# Patient Record
Sex: Male | Born: 1950 | Race: White | Hispanic: No | Smoking: Former smoker
Health system: Southern US, Community
[De-identification: ages and names within clinical notes are randomized; demographics above are authoritative.]

## PROBLEM LIST (undated history)

## (undated) DIAGNOSIS — M1990 Unspecified osteoarthritis, unspecified site: Secondary | ICD-10-CM

## (undated) DIAGNOSIS — G4730 Sleep apnea, unspecified: Secondary | ICD-10-CM

## (undated) DIAGNOSIS — I38 Endocarditis, valve unspecified: Secondary | ICD-10-CM

## (undated) DIAGNOSIS — Z952 Presence of prosthetic heart valve: Secondary | ICD-10-CM

## (undated) DIAGNOSIS — L0390 Cellulitis, unspecified: Secondary | ICD-10-CM

## (undated) DIAGNOSIS — I872 Venous insufficiency (chronic) (peripheral): Secondary | ICD-10-CM

## (undated) DIAGNOSIS — I482 Chronic atrial fibrillation, unspecified: Secondary | ICD-10-CM

## (undated) DIAGNOSIS — I509 Heart failure, unspecified: Secondary | ICD-10-CM

## (undated) DIAGNOSIS — F419 Anxiety disorder, unspecified: Secondary | ICD-10-CM

## (undated) DIAGNOSIS — I447 Left bundle-branch block, unspecified: Secondary | ICD-10-CM

## (undated) DIAGNOSIS — K3184 Gastroparesis: Secondary | ICD-10-CM

## (undated) DIAGNOSIS — I471 Supraventricular tachycardia, unspecified: Secondary | ICD-10-CM

## (undated) DIAGNOSIS — J449 Chronic obstructive pulmonary disease, unspecified: Secondary | ICD-10-CM

## (undated) DIAGNOSIS — I10 Essential (primary) hypertension: Secondary | ICD-10-CM

## (undated) DIAGNOSIS — I4891 Unspecified atrial fibrillation: Secondary | ICD-10-CM

## (undated) DIAGNOSIS — N200 Calculus of kidney: Secondary | ICD-10-CM

## (undated) DIAGNOSIS — A419 Sepsis, unspecified organism: Secondary | ICD-10-CM

## (undated) DIAGNOSIS — N390 Urinary tract infection, site not specified: Secondary | ICD-10-CM

## (undated) DIAGNOSIS — I5042 Chronic combined systolic (congestive) and diastolic (congestive) heart failure: Secondary | ICD-10-CM

## (undated) DIAGNOSIS — F332 Major depressive disorder, recurrent severe without psychotic features: Secondary | ICD-10-CM

## (undated) DIAGNOSIS — R6520 Severe sepsis without septic shock: Secondary | ICD-10-CM

## (undated) DIAGNOSIS — I2510 Atherosclerotic heart disease of native coronary artery without angina pectoris: Secondary | ICD-10-CM

## (undated) DIAGNOSIS — J9621 Acute and chronic respiratory failure with hypoxia: Secondary | ICD-10-CM

## (undated) DIAGNOSIS — L03116 Cellulitis of left lower limb: Secondary | ICD-10-CM

## (undated) DIAGNOSIS — I5022 Chronic systolic (congestive) heart failure: Secondary | ICD-10-CM

## (undated) DIAGNOSIS — E119 Type 2 diabetes mellitus without complications: Secondary | ICD-10-CM

## (undated) HISTORY — PX: CARDIAC VALVE REPLACEMENT: SHX585

## (undated) HISTORY — DX: Anxiety disorder, unspecified: F41.9

## (undated) HISTORY — DX: Presence of prosthetic heart valve: Z95.2

## (undated) HISTORY — DX: Venous insufficiency (chronic) (peripheral): I87.2

## (undated) HISTORY — PX: BACK SURGERY: SHX140

## (undated) HISTORY — DX: Left bundle-branch block, unspecified: I44.7

## (undated) HISTORY — DX: Urinary tract infection, site not specified: N39.0

## (undated) HISTORY — DX: Major depressive disorder, recurrent severe without psychotic features: F33.2

---
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
## 2004-06-11 ENCOUNTER — Ambulatory Visit: Payer: Self-pay | Admitting: Family Medicine

## 2004-07-08 HISTORY — PX: COLONOSCOPY: SHX174

## 2004-07-18 ENCOUNTER — Ambulatory Visit: Payer: Self-pay | Admitting: Family Medicine

## 2004-09-04 ENCOUNTER — Ambulatory Visit: Payer: Self-pay | Admitting: Family Medicine

## 2004-10-14 ENCOUNTER — Emergency Department (HOSPITAL_COMMUNITY): Admission: EM | Admit: 2004-10-14 | Discharge: 2004-10-14 | Payer: Self-pay | Admitting: Emergency Medicine

## 2004-10-16 ENCOUNTER — Ambulatory Visit: Payer: Self-pay | Admitting: Family Medicine

## 2004-10-22 ENCOUNTER — Ambulatory Visit (HOSPITAL_COMMUNITY): Admission: RE | Admit: 2004-10-22 | Discharge: 2004-10-22 | Payer: Self-pay | Admitting: Family Medicine

## 2004-10-22 ENCOUNTER — Ambulatory Visit: Payer: Self-pay | Admitting: *Deleted

## 2004-11-02 ENCOUNTER — Ambulatory Visit: Payer: Self-pay | Admitting: Family Medicine

## 2004-12-04 ENCOUNTER — Ambulatory Visit: Payer: Self-pay | Admitting: Family Medicine

## 2005-01-22 ENCOUNTER — Ambulatory Visit: Payer: Self-pay | Admitting: Internal Medicine

## 2005-01-22 ENCOUNTER — Ambulatory Visit (HOSPITAL_COMMUNITY): Admission: RE | Admit: 2005-01-22 | Discharge: 2005-01-22 | Payer: Self-pay | Admitting: Internal Medicine

## 2005-03-28 ENCOUNTER — Ambulatory Visit: Payer: Self-pay | Admitting: Family Medicine

## 2005-04-10 ENCOUNTER — Ambulatory Visit: Payer: Self-pay | Admitting: Family Medicine

## 2005-05-22 ENCOUNTER — Ambulatory Visit: Payer: Self-pay | Admitting: Family Medicine

## 2005-07-22 ENCOUNTER — Ambulatory Visit: Payer: Self-pay | Admitting: Family Medicine

## 2005-10-17 ENCOUNTER — Ambulatory Visit: Payer: Self-pay | Admitting: Family Medicine

## 2005-10-28 ENCOUNTER — Ambulatory Visit: Payer: Self-pay | Admitting: Family Medicine

## 2005-11-30 ENCOUNTER — Emergency Department (HOSPITAL_COMMUNITY): Admission: EM | Admit: 2005-11-30 | Discharge: 2005-11-30 | Payer: Self-pay | Admitting: Emergency Medicine

## 2006-02-05 ENCOUNTER — Ambulatory Visit: Payer: Self-pay | Admitting: Family Medicine

## 2006-03-25 LAB — HM MAMMOGRAPHY: HM Mammogram: NORMAL

## 2006-05-08 ENCOUNTER — Ambulatory Visit: Payer: Self-pay | Admitting: Family Medicine

## 2006-05-13 ENCOUNTER — Ambulatory Visit: Payer: Self-pay | Admitting: Family Medicine

## 2006-08-21 ENCOUNTER — Ambulatory Visit: Payer: Self-pay | Admitting: Family Medicine

## 2006-11-24 ENCOUNTER — Ambulatory Visit: Payer: Self-pay | Admitting: Family Medicine

## 2009-06-14 LAB — HM COLONOSCOPY: HM Colonoscopy: NORMAL

## 2010-11-23 NOTE — Op Note (Signed)
NAME:  DRADEN, COTTINGHAM                ACCOUNT NO.:  [id number]   MEDICAL RECORD NO.:  [id number]          PATIENT TYPE:  AMB   LOCATION:  DAY                           FACILITY:  [facility]   PHYSICIAN:  R. Roetta Sessions, M.D. DATE OF BIRTH:  [birth year]   DATE OF PROCEDURE:  01/22/2005  DATE OF DISCHARGE:                                 OPERATIVE REPORT   PROCEDURE:  Screening colonoscopy.   INDICATIONS FOR PROCEDURE:  The patient is a 60 year old gentleman with no  lower GI tract symptoms sent over courtesy of Dr. Joette Catching for  colorectal cancer screening.  There is no family history of colorectal  neoplasia.  He has never had a colonoscopy.  Colonoscopy is now being done.  This procedure has been discussed with the patient at length the potential  risks, benefits, and alternatives.  All questions answered.  He is agreeable  to proceed.  Documentation in the medial record.   PROCEDURE NOTE:  O2 saturation, blood pressure __________ monitored through  the entire procedure.   CONSCIOUS SEDATION:  Versed 4 mg IV, Demerol 75 mg IV in divided doses.   INSTRUMENT:  Olympus video __________ system.   FINDINGS:  Digital exam revealed no abnormalities.  Scope findings:  Prep  was good on the left side, somewhat poor on the right side.  Rectum:  Examination of the rectum with retroflexion at the anal verge revealed no  abnormalities.   Colonic mucosa was surveyed from the rectosigmoid junction, through the  left, transverse, and right colon, to the appendiceal orifice, ileocecal  valve, and cecum.  These structures were well seen and photographed for the  record.  There was a thin coating of stool throughout the right colon  mucosa, which made the exam more difficult.  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  The remainder of the cecum, ileocecal valve area appeared  just fine.  The scope was slowly withdrawn.  All previously mentioned  mucosal surfaces were again seen.  The more distal  colonic mucosa also  appeared normal, with no other diverticula or other abnormalities seen.  The  patient tolerated the procedure well and was reactivated.   ENDOSCOPIC IMPRESSION:  1.  Normal rectum.  2.  Solitary cecal diverticulum.  3.  Rectal and colonic mucosa appeared normal.   RECOMMENDATIONS:  Repeat colonoscopy in 10 years for screening purposes.       RMR/MEDQ  D:  01/22/2005  T:  01/22/2005  Job:  [id number]   cc:   Delaney Meigs, M.D.  [address]  [place]  [place] [address]  Fax: [phone]   R. Roetta Sessions, M.D.  [address]  [place]  [place] [address]

## 2010-11-23 NOTE — Procedures (Signed)
NAME:  OLA, FAWVER NO.:  [id number]   MEDICAL RECORD NO.:  [id number]          PATIENT TYPE:  OUT   LOCATION:  RAD                           FACILITY:  [facility]   PHYSICIAN:  [place] Bing, M.D.  DATE OF BIRTH:  January 18, 1951   DATE OF PROCEDURE:  10/22/2004  DATE OF DISCHARGE:                                  ECHOCARDIOGRAM   REFERRING PHYSICIAN:  Delaney Meigs, M.D.   CLINICAL DATA:  A 60 year old gentleman with a murmur.   Aorta 3.8, left atrium 4.7, septum 1.9, posterior wall 1.4, LV diastole 5.1,  LV systole 3.8.   1.  Technically difficult and somewhat suboptimal echocardiographic study.  2.  Mild left atrial enlargement; normal right atrium and right ventricle.  3.  Minimal aortic valvular sclerosis with mild calcification of the      proximal ascending aorta.  4.  Normal mitral valve; systolic anterior motion of the chordae, which is a      normal variant.  5.  Normal tricuspid valve.  6.  Normal pulmonic valve and proximal pulmonary artery.  7.  Normal left ventricular size; left ventricular hypertrophy present. Not      all myocardial segments adequately evaluated, no areas of akinesis nor      dyskinesis. Overall LV systolic function appears normal.      RR/MEDQ  D:  10/22/2004  T:  10/22/2004  Job:  [id number]   cc:   Delaney Meigs, M.D.  [address]  [place]  [place] [address]  Fax: [phone]

## 2012-06-19 ENCOUNTER — Inpatient Hospital Stay: Payer: Self-pay | Admitting: Cardiology

## 2012-06-19 DIAGNOSIS — I959 Hypotension, unspecified: Secondary | ICD-10-CM | POA: Diagnosis present

## 2012-06-19 DIAGNOSIS — I471 Supraventricular tachycardia, unspecified: Secondary | ICD-10-CM

## 2012-06-19 DIAGNOSIS — L03119 Cellulitis of unspecified part of limb: Secondary | ICD-10-CM | POA: Diagnosis present

## 2012-06-19 DIAGNOSIS — K089 Disorder of teeth and supporting structures, unspecified: Secondary | ICD-10-CM | POA: Diagnosis present

## 2012-06-26 ENCOUNTER — Encounter (HOSPITAL_COMMUNITY): Payer: Self-pay | Admitting: *Deleted

## 2012-06-26 ENCOUNTER — Other Ambulatory Visit: Payer: Self-pay | Admitting: *Deleted

## 2012-06-26 ENCOUNTER — Encounter: Payer: Self-pay | Admitting: Cardiology

## 2012-06-26 ENCOUNTER — Inpatient Hospital Stay (HOSPITAL_COMMUNITY)
Admission: AD | Admit: 2012-06-26 | Discharge: 2012-07-15 | DRG: 988 | Disposition: A | Discharge status: Home / Self Care | Payer: MEDICAID | Source: Other Acute Inpatient Hospital | Attending: Internal Medicine | Admitting: Internal Medicine

## 2012-06-26 DIAGNOSIS — I340 Nonrheumatic mitral (valve) insufficiency: Secondary | ICD-10-CM | POA: Diagnosis present

## 2012-06-26 DIAGNOSIS — T368X5A Adverse effect of other systemic antibiotics, initial encounter: Secondary | ICD-10-CM | POA: Diagnosis present

## 2012-06-26 DIAGNOSIS — I471 Supraventricular tachycardia, unspecified: Secondary | ICD-10-CM

## 2012-06-26 DIAGNOSIS — D696 Thrombocytopenia, unspecified: Secondary | ICD-10-CM | POA: Diagnosis not present

## 2012-06-26 DIAGNOSIS — K045 Chronic apical periodontitis: Secondary | ICD-10-CM | POA: Diagnosis present

## 2012-06-26 DIAGNOSIS — F3289 Other specified depressive episodes: Secondary | ICD-10-CM | POA: Diagnosis present

## 2012-06-26 DIAGNOSIS — N289 Disorder of kidney and ureter, unspecified: Secondary | ICD-10-CM

## 2012-06-26 DIAGNOSIS — L270 Generalized skin eruption due to drugs and medicaments taken internally: Secondary | ICD-10-CM | POA: Diagnosis not present

## 2012-06-26 DIAGNOSIS — N179 Acute kidney failure, unspecified: Secondary | ICD-10-CM | POA: Diagnosis present

## 2012-06-26 DIAGNOSIS — L03119 Cellulitis of unspecified part of limb: Secondary | ICD-10-CM | POA: Diagnosis present

## 2012-06-26 DIAGNOSIS — Z79899 Other long term (current) drug therapy: Secondary | ICD-10-CM

## 2012-06-26 DIAGNOSIS — I447 Left bundle-branch block, unspecified: Secondary | ICD-10-CM | POA: Diagnosis present

## 2012-06-26 DIAGNOSIS — R21 Rash and other nonspecific skin eruption: Secondary | ICD-10-CM | POA: Diagnosis not present

## 2012-06-26 DIAGNOSIS — I059 Rheumatic mitral valve disease, unspecified: Secondary | ICD-10-CM | POA: Diagnosis present

## 2012-06-26 DIAGNOSIS — E46 Unspecified protein-calorie malnutrition: Secondary | ICD-10-CM | POA: Diagnosis not present

## 2012-06-26 DIAGNOSIS — K089 Disorder of teeth and supporting structures, unspecified: Secondary | ICD-10-CM | POA: Diagnosis present

## 2012-06-26 DIAGNOSIS — I330 Acute and subacute infective endocarditis: Principal | ICD-10-CM

## 2012-06-26 DIAGNOSIS — I38 Endocarditis, valve unspecified: Secondary | ICD-10-CM

## 2012-06-26 DIAGNOSIS — K029 Dental caries, unspecified: Secondary | ICD-10-CM | POA: Diagnosis present

## 2012-06-26 DIAGNOSIS — K036 Deposits [accretions] on teeth: Secondary | ICD-10-CM | POA: Diagnosis present

## 2012-06-26 DIAGNOSIS — I959 Hypotension, unspecified: Secondary | ICD-10-CM | POA: Diagnosis present

## 2012-06-26 DIAGNOSIS — L02419 Cutaneous abscess of limb, unspecified: Secondary | ICD-10-CM | POA: Diagnosis present

## 2012-06-26 DIAGNOSIS — F32A Depression, unspecified: Secondary | ICD-10-CM | POA: Diagnosis present

## 2012-06-26 DIAGNOSIS — E119 Type 2 diabetes mellitus without complications: Secondary | ICD-10-CM | POA: Diagnosis present

## 2012-06-26 DIAGNOSIS — K0530 Chronic periodontitis, unspecified: Secondary | ICD-10-CM | POA: Diagnosis present

## 2012-06-26 DIAGNOSIS — Z6841 Body Mass Index (BMI) 40.0 and over, adult: Secondary | ICD-10-CM

## 2012-06-26 DIAGNOSIS — F329 Major depressive disorder, single episode, unspecified: Secondary | ICD-10-CM | POA: Diagnosis present

## 2012-06-26 DIAGNOSIS — Z7982 Long term (current) use of aspirin: Secondary | ICD-10-CM

## 2012-06-26 DIAGNOSIS — Z6835 Body mass index (BMI) 35.0-35.9, adult: Secondary | ICD-10-CM | POA: Diagnosis present

## 2012-06-26 DIAGNOSIS — I10 Essential (primary) hypertension: Secondary | ICD-10-CM | POA: Diagnosis present

## 2012-06-26 DIAGNOSIS — Z87442 Personal history of urinary calculi: Secondary | ICD-10-CM | POA: Diagnosis present

## 2012-06-26 DIAGNOSIS — E669 Obesity, unspecified: Secondary | ICD-10-CM | POA: Diagnosis present

## 2012-06-26 DIAGNOSIS — E876 Hypokalemia: Secondary | ICD-10-CM | POA: Diagnosis present

## 2012-06-26 DIAGNOSIS — I498 Other specified cardiac arrhythmias: Secondary | ICD-10-CM | POA: Diagnosis present

## 2012-06-26 DIAGNOSIS — D649 Anemia, unspecified: Secondary | ICD-10-CM | POA: Diagnosis present

## 2012-06-26 DIAGNOSIS — K083 Retained dental root: Secondary | ICD-10-CM | POA: Diagnosis present

## 2012-06-26 HISTORY — DX: Morbid (severe) obesity due to excess calories: E66.01

## 2012-06-26 HISTORY — DX: Cellulitis of left lower limb: L03.116

## 2012-06-26 LAB — COMPREHENSIVE METABOLIC PANEL
ALT: 31 U/L (ref 0–53)
AST: 24 U/L (ref 0–37)
Albumin: 3 g/dL — ABNORMAL LOW (ref 3.5–5.2)
Alkaline Phosphatase: 70 U/L (ref 39–117)
BUN: 41 mg/dL — ABNORMAL HIGH (ref 6–23)
CO2: 19 mEq/L (ref 19–32)
Calcium: 8.7 mg/dL (ref 8.4–10.5)
Chloride: 95 mEq/L — ABNORMAL LOW (ref 96–112)
Creatinine, Ser: 5.65 mg/dL — ABNORMAL HIGH (ref 0.50–1.35)
GFR calc Af Amer: 11 mL/min — ABNORMAL LOW (ref >90)
GFR calc non Af Amer: 10 mL/min — ABNORMAL LOW (ref >90)
Glucose, Bld: 187 mg/dL — ABNORMAL HIGH (ref 70–99)
Potassium: 3.9 mEq/L (ref 3.5–5.1)
Sodium: 129 mEq/L — ABNORMAL LOW (ref 135–145)
Total Bilirubin: 0.4 mg/dL (ref 0.3–1.2)
Total Protein: 6.4 g/dL (ref 6.0–8.3)

## 2012-06-26 LAB — CBC
HCT: 38.1 % — ABNORMAL LOW (ref 39.0–52.0)
Hemoglobin: 12.8 g/dL — ABNORMAL LOW (ref 13.0–17.0)
MCH: 30.9 pg (ref 26.0–34.0)
MCHC: 33.6 g/dL (ref 30.0–36.0)
MCV: 92 fL (ref 78.0–100.0)
Platelets: 133 10*3/uL — ABNORMAL LOW (ref 150–400)
RBC: 4.14 MIL/uL — ABNORMAL LOW (ref 4.22–5.81)
RDW: 13.1 % (ref 11.5–15.5)
WBC: 7.6 10*3/uL (ref 4.0–10.5)

## 2012-06-26 LAB — GLUCOSE, CAPILLARY
Glucose-Capillary: 166 mg/dL — ABNORMAL HIGH (ref 70–99)
Glucose-Capillary: 181 mg/dL — ABNORMAL HIGH (ref 70–99)

## 2012-06-26 LAB — VANCOMYCIN, RANDOM: Vancomycin Rm: 63.6 ug/mL

## 2012-06-26 LAB — MRSA PCR SCREENING: MRSA by PCR: NEGATIVE

## 2012-06-26 MED ORDER — HEPARIN SODIUM (PORCINE) 5000 UNIT/ML IJ SOLN
5000.0000 [IU] | Freq: Three times a day (TID) | INTRAMUSCULAR | Status: AC
  Administered 2012-06-26 – 2012-07-09 (×39): 5000 [IU] via SUBCUTANEOUS
  Filled 2012-06-26 (×41): qty 1

## 2012-06-26 MED ORDER — ASPIRIN EC 81 MG PO TBEC
81.0000 mg | DELAYED_RELEASE_TABLET | Freq: Every day | ORAL | Status: DC
  Administered 2012-06-26 – 2012-07-15 (×19): 81 mg via ORAL
  Filled 2012-06-26 (×20): qty 1

## 2012-06-26 MED ORDER — SERTRALINE HCL 100 MG PO TABS
100.0000 mg | ORAL_TABLET | Freq: Every day | ORAL | Status: DC
  Administered 2012-06-26 – 2012-07-02 (×7): 100 mg via ORAL
  Filled 2012-06-26 (×7): qty 1

## 2012-06-26 MED ORDER — SODIUM CHLORIDE 0.9 % IV SOLN
250.0000 mL | INTRAVENOUS | Status: DC | PRN
  Administered 2012-07-09: 500 mL via INTRAVENOUS

## 2012-06-26 MED ORDER — OMEGA-3-ACID ETHYL ESTERS 1 G PO CAPS
1.0000 g | ORAL_CAPSULE | Freq: Every day | ORAL | Status: DC
  Administered 2012-06-26 – 2012-07-15 (×18): 1 g via ORAL
  Filled 2012-06-26 (×20): qty 1

## 2012-06-26 MED ORDER — INSULIN GLARGINE 100 UNIT/ML ~~LOC~~ SOLN
20.0000 [IU] | Freq: Every day | SUBCUTANEOUS | Status: DC
  Administered 2012-06-26 – 2012-07-14 (×19): 20 [IU] via SUBCUTANEOUS

## 2012-06-26 MED ORDER — SODIUM CHLORIDE 0.9 % IJ SOLN
3.0000 mL | INTRAMUSCULAR | Status: DC | PRN
  Administered 2012-06-29 (×2): 3 mL via INTRAVENOUS

## 2012-06-26 MED ORDER — ADULT MULTIVITAMIN W/MINERALS CH
1.0000 | ORAL_TABLET | Freq: Every day | ORAL | Status: DC
  Administered 2012-06-26 – 2012-07-15 (×18): 1 via ORAL
  Filled 2012-06-26 (×20): qty 1

## 2012-06-26 MED ORDER — ONDANSETRON HCL 4 MG/2ML IJ SOLN: 4.0000 mg | Freq: Four times a day (QID) | INTRAMUSCULAR | Status: DC | PRN

## 2012-06-26 MED ORDER — SODIUM CHLORIDE 0.9 % IJ SOLN
3.0000 mL | Freq: Two times a day (BID) | INTRAMUSCULAR | Status: DC
  Administered 2012-06-27: 30 mL via INTRAVENOUS
  Administered 2012-06-29 – 2012-07-09 (×17): 3 mL via INTRAVENOUS

## 2012-06-26 MED ORDER — INSULIN ASPART 100 UNIT/ML ~~LOC~~ SOLN
0.0000 [IU] | Freq: Three times a day (TID) | SUBCUTANEOUS | Status: DC
  Administered 2012-06-27: 2 [IU] via SUBCUTANEOUS
  Administered 2012-06-27: 3 [IU] via SUBCUTANEOUS
  Administered 2012-06-27: 2 [IU] via SUBCUTANEOUS
  Administered 2012-06-28 (×2): 3 [IU] via SUBCUTANEOUS
  Administered 2012-06-28: 2 [IU] via SUBCUTANEOUS
  Administered 2012-06-29: 17:00:00 via SUBCUTANEOUS
  Administered 2012-06-30: 2 [IU] via SUBCUTANEOUS
  Administered 2012-06-30 (×2): 3 [IU] via SUBCUTANEOUS
  Administered 2012-07-01 (×3): 2 [IU] via SUBCUTANEOUS
  Administered 2012-07-02: 3 [IU] via SUBCUTANEOUS
  Administered 2012-07-02: 2 [IU] via SUBCUTANEOUS
  Administered 2012-07-02: 3 [IU] via SUBCUTANEOUS
  Administered 2012-07-03: 2 [IU] via SUBCUTANEOUS
  Administered 2012-07-03: 3 [IU] via SUBCUTANEOUS
  Administered 2012-07-04: 2 [IU] via SUBCUTANEOUS
  Administered 2012-07-04: 3 [IU] via SUBCUTANEOUS
  Administered 2012-07-05: 2 [IU] via SUBCUTANEOUS
  Administered 2012-07-05 (×2): 3 [IU] via SUBCUTANEOUS
  Administered 2012-07-06: 5 [IU] via SUBCUTANEOUS
  Administered 2012-07-06 – 2012-07-07 (×3): 3 [IU] via SUBCUTANEOUS
  Administered 2012-07-07: 5 [IU] via SUBCUTANEOUS
  Administered 2012-07-07 – 2012-07-08 (×2): 3 [IU] via SUBCUTANEOUS
  Administered 2012-07-08: 2 [IU] via SUBCUTANEOUS
  Administered 2012-07-08: 5 [IU] via SUBCUTANEOUS
  Administered 2012-07-09 – 2012-07-10 (×3): 3 [IU] via SUBCUTANEOUS
  Administered 2012-07-11: 5 [IU] via SUBCUTANEOUS
  Administered 2012-07-12 – 2012-07-13 (×3): 3 [IU] via SUBCUTANEOUS
  Administered 2012-07-14: 18:00:00 via SUBCUTANEOUS
  Administered 2012-07-14: 3 [IU] via SUBCUTANEOUS
  Administered 2012-07-15: 5 [IU] via SUBCUTANEOUS
  Administered 2012-07-15: 2 [IU] via SUBCUTANEOUS

## 2012-06-26 MED ORDER — GLIPIZIDE 10 MG PO TABS
10.0000 mg | ORAL_TABLET | Freq: Two times a day (BID) | ORAL | Status: DC
  Administered 2012-06-27: 10 mg via ORAL
  Filled 2012-06-26 (×3): qty 1

## 2012-06-26 MED ORDER — ACETAMINOPHEN 325 MG PO TABS
650.0000 mg | ORAL_TABLET | ORAL | Status: DC | PRN
  Administered 2012-07-02 – 2012-07-13 (×3): 650 mg via ORAL
  Filled 2012-06-26 (×4): qty 2

## 2012-06-26 MED ORDER — NITROGLYCERIN 0.4 MG SL SUBL: 0.4000 mg | SUBLINGUAL_TABLET | SUBLINGUAL | Status: DC | PRN

## 2012-06-26 MED ORDER — GABAPENTIN 300 MG PO CAPS
300.0000 mg | ORAL_CAPSULE | Freq: Three times a day (TID) | ORAL | Status: DC
  Administered 2012-06-26 – 2012-06-27 (×2): 300 mg via ORAL
  Filled 2012-06-26 (×4): qty 1

## 2012-06-26 NOTE — H&P (Signed)
Patient ID: Cody Hale MRN: [id number], DOB/AGE: 02-17-1951   Admit date: 06/26/2012  Primary Physician: [facility] Dept. Primary Cardiologist: G. Degent MD  Pt. Profile:  61 y/o male without prior cardiac hx who was admitted to [facility] on Monday 12/16 with hypotension and presumed sepsis, who was transferred to [facility] for further evaluation after TEE today showed large mitral valve vegetation.  Problem List  Past Medical History  Diagnosis Date  . Poor dentition 06/26/2012    Severe dental caries and cavities  . Diabetes mellitus without complication   . Left leg cellulitis   . Obesity   . History of tobacco abuse   . Mitral valve vegetation     a. 06/25/2012 Echo: EF 45-50%, mod-sev conc LVH, nl RV, ? MV veg, Trace TR;  06/26/2012 TEE:  MV vegetation noted.    No past surgical history on file.   Allergies  No Known Allergies  HPI  61 y/o male w/o prior cardiac hx.  He notes that over the past few months, he has been experiencing progressive fatigue and low-energy state.  Starting on Monday morning, 12/16, he developed severe chills and fevers along with worsening fatigue/malaise.  He presented to [facility] in [place] where he was found to be hypotensive with elevated lactate (2.4).  He was admitted to Internal Medicine and placed on IV Vancomycin.  He was also aggressively hydrated with stabilization of blood pressures.  He did have intermittent diarrhea and stool was negative for C diff.  LE u/s was performed upon the left lower leg 2/2 erythema & swelling/cellulits, and this was negative for DVT.  Pt was clinically improving and on 12/18 was noted to have runs of SVT.  Cardiology was consulted and he was seen by Dr. Andee Lineman.  Bedside echo was performed and inconclusive.  F/U formal echo showed EF of 45-50% with question of MV vegetation.  He subsequently underwent TEE today, which confirmed a large MV vegetation.  He was transferred to [facility] for surgical and  infectious disease evaluation.  Upon arrival, pt is w/o complaints and is hemodynamically stable.  Of note, in the setting of vancomycin therapy, he has developed acute renal failure with rise in his creatinine from 0.7 on 12/18 to 4.68 this AM.  Per Dr. Andee Lineman, Vancomycin trough today was 77.9 (I confirmed this with lab @ [facility]).  Home Medications  Prior to Admission medications   Medication Sig Start Date End Date Taking? Authorizing Provider  aspirin EC 81 MG tablet Take 81 mg by mouth daily.   Yes Historical Provider, MD  gabapentin (NEURONTIN) 300 MG capsule Take 300 mg by mouth 3 (three) times daily.   Yes Historical Provider, MD  glipiZIDE (GLUCOTROL) 10 MG tablet Take 10 mg by mouth 2 (two) times daily before a meal.   Yes Historical Provider, MD  meloxicam (MOBIC) 15 MG tablet Take 15 mg by mouth daily.   Yes Historical Provider, MD  metFORMIN (GLUCOPHAGE) 500 MG tablet Take 1,000 mg by mouth 2 (two) times daily with a meal.    Yes Historical Provider, MD  Multiple Vitamin (MULTIVITAMIN WITH MINERALS) TABS Take 1 tablet by mouth daily.   Yes Historical Provider, MD  olmesartan-hydrochlorothiazide (BENICAR HCT) 40-25 MG per tablet Take 1 tablet by mouth daily.   Yes Historical Provider, MD  Omega-3 Fatty Acids (FISH OIL PO) Take 1 capsule by mouth daily.   Yes Historical Provider, MD  sertraline (ZOLOFT) 100 MG tablet Take 100 mg by mouth  daily.   Yes Historical Provider, MD   Family History  Family History  Problem Relation Age of Onset  . Lung cancer Father     died @ 106  . Ovarian cancer Mother     died @ 27   Social History  History   Social History  . Marital Status: Single    Spouse Name: N/A    Number of Children: N/A  . Years of Education: N/A   Occupational History  . Not on file.   Social History Main Topics  . Smoking status: Former Games developer  . Smokeless tobacco: Not on file     Comment: smoked about 1.5ppd x 15 yrs, quit 15 yrs ago.  . Alcohol Use: No    . Drug Use: No  . Sexually Active: Not on file   Other Topics Concern  . Not on file   Social History Narrative   Lives in [place] with his wife.  Works @ [employer].  Does not routinely exercise.    Review of Systems General:  No chills, fever, night sweats or weight changes.  Cardiovascular:  No chest pain, dyspnea on exertion, edema, orthopnea, palpitations, paroxysmal nocturnal dyspnea. Dermatological: No rash, lesions/masses Respiratory: No cough, dyspnea Urologic: No hematuria, dysuria Abdominal:   No nausea, vomiting, diarrhea, bright red blood per rectum, melena, or hematemesis Neurologic:  No visual changes, wkns, changes in mental status. All other systems reviewed and are otherwise negative except as noted above.  Physical Exam  Blood pressure 157/59, pulse 82, temperature 97.9 F (36.6 C), temperature source Oral, resp. rate 14, height 5\' 10"  (1.778 m), weight 315 lb 0.6 oz (142.9 kg), SpO2 97.00%.  General: Pleasant, NAD Psych: Normal affect. Neuro: Alert and oriented X 3. Moves all extremities spontaneously. HEENT: Normal with exception of poor dentition. Neck: Supple, obese, without bruits. Difficult to assess JVP. Lungs:  Resp regular and unlabored, CTA. Heart: RRR no s3, s4, 2/6 syst murmur heard best @ [facility]. Abdomen: Soft, non-tender, protuberant, BS + x 4.  Extremities: No clubbing, cyanosis.  Left lower leg with 1+ edema and erythema.  Trace edema on the right.  DP/PT/Radials 2+ and equal bilaterally.  Labs   See [facility] records.  Radiology/Studies  See [facility] records.  ECG  Pending.  ASSESSMENT AND PLAN  1.  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  We have spoken with thoracic surgery and infectious disease who will see patient on Monday and Saturday respectively.  We will  continue Vancomycin per pharmacy, understanding that his last trough was markedly elevated.  I spoke with lab @ [facility] and they confirmed level of 77.9 at 10:34 this AM.  We will repeat a level now.  In preparation for surgical intervention, we will obtain orthopantomogram and request a dental consult for Monday.  2.  LLE Cellulitis:  Vanc per pharmacy.  3.  DM:  Cont lantus/ssi.  Hold metformin.  4.  Acute renal failure:  In setting of vancomycin toxicity.  Follow creatinine over the weekend.    Signed, Nicolasa Ducking, NP 06/26/2012, 6:32 PM   History and all data above reviewed.  Patient examined.  I agree with the findings as above. I have reviewed the TEE.  I spoke with Dr. Andee Lineman, Dr. Cornelius Moras and Dr. Orvan Falconer.   The patient has a large anterior leaflet mitral valve vegetation.  Blood cultures are  negative.  However, a likely source is oral.  He has acute renal insufficiency related to vancomycin toxicity.  He currently denies any chest pain or SOB.  He has no ongoing fevers or chills.  Hypotension related to sepsis syndrome has improved.  The patient exam reveals COR:RRR slight systolic murmur  ,  Lungs: Clear  ,  Abd: Positive bowel sounds, no rebound no guarding, Ext Severe left greater than right edema with left leg erythema  .  All available labs, radiology testing, previous records reviewed. Agree with documented assessment and plan. I have discussed the case as above.  The patient has indication for mitral valve surgery.  He is currently symptom free.  He will be seen by Dr. Cornelius Moras on Monday.  ID will see the patient in the AM.  We will check dental XRays.  I was unable to contact oral surgery on call. Vancomycin is held and a level will be drawn.  Surgery will need to be deferred pending improvement in renal function and an oral surgery consult.     Fayrene Fearing Keone Kamer  9:40 PM  06/26/2012

## 2012-06-26 NOTE — Progress Notes (Signed)
Per Marvis Repress, pts dental xray can be done [facility]. Pt must travel on tele.

## 2012-06-26 NOTE — H&P (Addendum)
Patient transferred from [facility] to [facility]. He has been evaluated and followed by Dr. Andee Lineman for diagnosis of culture negative endocarditis with large mitral valve vegetation and increased risk for thromboembolic event. Transfer to a tertiary care facility for surgical consultation recommended, and patient preferred [facility] rather than [facility], however plans to continue to followup with Dr. Andee Lineman after discharge. I was asked to help facilitate transfer, and records were moved into Epic from the [place] system including Dr. Margarita Mail consultation note, and a surface echocardiogram. TEE report is still pending, however a disc of the images is being sent with the patient for review. Dr. Andee Lineman indicates that he has spoken with Dr. Cornelius Moras from [facility] and relayed information to him. Dr. Tenny Craw is the accepting physician on our service. We have spoken with Trish to help coordinate consultation with [facility], ID, and also consideration for brain MRI to assess for any potential subclinical evidence of thromboembolism, although today's creatinine at [facility] is listed at 4.6 and was 0.7 on 12/18 (this needs to be repeated). Below is copied Dr. Margarita Mail initial consultation note. Patient has been stable on telemetry, presently on vancomycin. He will need to be seen and assessed by our team on arrival to [facility] - I have not been involved in his care at [facility].  CARDIOLOGY CONSULTATION REPORT   PRIMARY CARDIOLOGIST: DR. Michelle Piper DEGENT   DATE OF CONSULTATION: 06/24/2012   PRIMARY CARE PROVIDER: [facility] Department.   REQUESTING PHYSICIAN: Erasmo Downer, M.D.   REASON FOR CONSULTATION: Arrhythmia with run of supraventricular tachycardia.  Patient has underlying left bundle branch block.   HISTORY OF PRESENT ILLNESS: The patient was admitted with profound hypotension  requiring intravenous pressors and central line placement. Blood cultures have  been negative, but the patient  has cellulitis of the left lower extremity. The  patient is also diabetic. He has no known heart condition, although he states  that he has had an ultrasound done several years ago at [facility]. The  patient is significantly overweight and has poor exercise tolerance. On  admission also a serum lactate was elevated at 3. Cardiac enzymes were normal.  EKG showed a left bundle branch block.  During his ICU stay, the patient had normal sinus rhythm, typically in the 90s.  He was moved out of the intensive care unit today, and Dr. Linna Darner called me  because he had noticed that the patient had runs of supraventricular  tachycardia. He was not sure if this has been ventricular tachycardia or not.  The patient, however, had left bundle branch block, and the morphology of these  runs was similar to his baseline EKG.  The patient's blood pressure has improved now. However, he continues to have a  significant cellulitis of the left lower extremity.   PAST MEDICAL HISTORY:  1. Diabetes mellitus.  2. History of depression.  3. History of kidney stones.   PAST SURGICAL HISTORY: Surgery on jaw after traumatic fracture.   SOCIAL HISTORY: The patient is married and lives with his wife, who is my  patient. Denies significant use of alcohol. Currently works at a [employer] and does not smoke.   FAMILY HISTORY: negative for premature coronary artery disease or cancer.   ALLERGIES: No known drug allergies.   ADMISSION MEDICATIONS:  1. Zoloft 100 a day.  2. Benicar 40 a day.  3. Glipizide 5 mg a day.  4. Mobic 15 mg daily.  5. Gabapentin 300 mg p.o. t.i.d.  6. Metformin 500 mg p.o. b.i.d.  7. Aspirin 325 mg p.o. daily.   REVIEW OF SYSTEMS: Patient's weight has been stable. He denies any chest  pain, chest pressure or palpitations. He is actually asymptomatic with these  runs. Gastrointestinal: The patient has had some episodes of vomiting and  diarrhea. He also had some shaking chills  with rigor and 1 episode of vomiting  several days ago. The remainder of the 14-point review of systems is negative.  Of note is also that the patient has very poor dentition.   PHYSICAL EXAMINATION: Vital signs: Blood pressure currently is 103/54, heart  rate 83 beats per minute, temperature is 98.1, saturation 94% on room air.  General: An overweight white male, but in no distress. HEENT: EOMI. PERRLA.  Dentition is extremely poor. The patient has several missing teeth and several  large cavities. He has no membranes or exudates. Neck: Normal carotid  upstroke and no carotid bruits. No thyromegaly. No nodular thyroid. JVP was  difficult to determine. The patient was sitting in a 45-degree angle, but he  has a short neck and I really could not appreciate a JVP. Lungs: Clear breath  sounds bilaterally without any wheezing. Heart: Regular rate and rhythm.  Normal S1, S2, but a paradoxically split second heart sound. The patient does  have a 2/6 murmur towards the apex. It was difficult to auscultate whether  this was systolic or diastolic. I suspect it is systolic. Abdomen: Soft,  nontender, but distended. No rebound or guarding. I was not able to palpate  the liver or spleen. GU: The patient has some scrotal edema. He also has  penile edema. Extremities: Legs with significant lower extremity edema  bilaterally with cellulitis in the left lower extremity. The entire leg below  the knee feels very hot and is tender to the touch. No ulcers are seen.  Vascular: I could not palpate dorsalis pedis and posterior tibial pulses  bilaterally. I did not examine the femoral pulses. Neurologic: Patient is  alert and oriented and grossly nonfocal. Skin: Warm and dry. Psychiatric:  Normal affect.   LABORATORY WORK: White count is down to 7900, hemoglobin is 13.6, platelet  count is 115,000. Significant left shift on admission. Chemistry: Creatinine  0.7, GFR is greater than 60, potassium is 3.2.  Hemoglobin A1c is 9.6, ALT is  50, albumin is 3.3, lipase is 16. Coagulation parameters within normal limits.  Blood cultures, no growth after 48 hours.   IMAGING: Twelve-lead EKG: Normal sinus rhythm, left bundle branch block.   PROBLEM LIST:  1. Status post hypotensive secondary to distribute shock likely with  bacterial infection, but negative blood cultures.  2. Supraventricular tachycardia with left bundle branch block, likely in the  setting of hypokalemia.  3. Unknown left ventricular function.  4. Evidence of right heart failure symptoms with significant edema.  5. Rule out obstructive sleep apnea.   PLAN:  1. I attempted a bedside echocardiogram, but really could not visualize the  patient's heart.  2. We will order an echocardiogram for tomorrow to make sure that he has  abnormal LV function, particularly in light of his left bundle branch  block. I think a study might be technically difficult and we will  definitely have to use Definity for this study.  3. If we are unable to visualize the heart with the transthoracic  echocardiogram, I think we probably should to a transesophageal  echocardiogram given the patient's extremely poor dentition and his  cellulitis, and  his profound hypotension which was out of proportion to  the fact that we have negative blood cultures. He also has a murmur on  exam, and patient is at risk for endocarditis.  4. Presently I would correct his hypokalemia, and we will await for the  results of the echocardiogram for further recommendations.  5. No further management of the brief runs of supraventricular tachycardia is  necessary, particularly because the patient is asymptomatic.   DICTATED NOT READ  (unless electronically signed)  ______________________________  Learta Codding, MD

## 2012-06-26 NOTE — Discharge Summary (Addendum)
[facility]                                       [place], [place]  [address]     NAME:  Gunby,Damean DAVID                   ROOM:          223-01                UNIT NUMBER:  [id number]                        LOCATION:      36F         ADM/VISIT DATE:     06/22/12                ADM PHYS:      Francetta Found MD   ACCT: [id number]                               DOB:           2050-09-18   CARDIOLOGY CONSULTATION REPORT   PRIMARY CARDIOLOGIST: DR. Lewayne Bunting   DATE OF CONSULTATION:  06/24/2012   PRIMARY CARE PROVIDER:  [facility].   CONSULTING PHYSICIAN:  Erasmo Downer, M.D.   REASON FOR CONSULTATION:  Arrhythmia with run of supraventricular tachycardia.  Patient has underlying left bundle branch block.   HISTORY OF PRESENT ILLNESS:  The patient was admitted with profound hypotension requiring intravenous pressors and central line placement.  Blood cultures have been negative, but the patient has cellulitis of the left lower extremity.  The patient is also diabetic.  He has no known heart condition, although he states that he has had an ultrasound done several years ago at [facility].  The patient is significantly overweight and has poor exercise tolerance.  On admission also a serum lactate was elevated at 3.  Cardiac enzymes were normal. EKG showed a left bundle branch block.   During his ICU stay, the patient had normal sinus rhythm, typically in the 90s. He was moved out of the intensive care unit today, and Dr. Linna Darner called me because he had noticed that the patient had runs of supraventricular tachycardia.  He was not sure if this has been ventricular tachycardia or not.  The patient, however, had left bundle branch block, and the morphology of these runs was similar to his baseline EKG.   The patient's blood pressure has improved now.  However, he continues to have a significant cellulitis of the left lower  extremity.   PAST MEDICAL HISTORY: 1.   Diabetes mellitus. 2.   History of depression. 3.   History of kidney stones.   PAST SURGICAL HISTORY:  Surgery on jaw after traumatic fracture.   SOCIAL HISTORY:  The patient is married and lives with his wife, who is my patient.  Denies significant use of alcohol.  Currently works at a [employer] and does not smoke.   FAMILY HISTORY:  negative for premature coronary artery disease or cancer.   ALLERGIES:  No known drug  allergies.   >>>>>>>>>>>>>>>>>>>>>>>>>>>>>>>>  END OF PAGE  <<<<<<<<<<<<<<<<<<<<<<<<<<<<<<<<                                     [facility]                                       [place], [place]  [address]     NAME:  Armwood,Sava DAVID                   ROOM:          223-01                UNIT NUMBER:  [id number]                        LOCATION:      91F         ADM/VISIT DATE:     06/22/12                ADM PHYS:      Francetta Found MD   ACCT: [id number]                               DOB:           May 31, 2051   CARDIOLOGY CONSULTATION REPORT       ADMISSION MEDICATIONS: 1.   Zoloft 100 a day. 2.   Benicar 40 a day. 3.   Glipizide 5 mg a day. 4.   Mobic 15 mg daily. 5.   Gabapentin 300 mg p.o. t.i.d. 6.   Metformin 500 mg p.o. b.i.d. 7.   Aspirin 325 mg p.o. daily.   REVIEW OF SYSTEMS:  Patient's weight has been stable.  He denies any chest pain, chest pressure or palpitations.  He is actually asymptomatic with these runs.  Gastrointestinal:  The patient has had some episodes of vomiting and diarrhea.  He also had some shaking chills with rigor and 1 episode of vomiting several days ago.  The remainder of the 14-point review of systems is negative. Of note is also that the patient has very poor dentition.   PHYSICAL EXAMINATION:  Vital signs:  Blood pressure currently is 103/54, heart rate 83 beats per minute, temperature is 98.1, saturation 94% on room air.  General:  An overweight white male, but in  no distress.  HEENT:  EOMI.  PERRLA. Dentition is extremely poor.  The patient has several missing teeth and several large cavities.  He has no membranes or exudates.  Neck:  Normal carotid upstroke and no carotid bruits.  No thyromegaly.  No nodular thyroid.  JVP was difficult to determine.  The patient was sitting in a 45-degree angle, but he has a short neck and I really could not appreciate a JVP.  Lungs:  Clear breath sounds bilaterally without any wheezing.  Heart:  Regular rate and rhythm.  Normal S1, S2, but a paradoxically split second heart sound.  The patient does have a 2/6 murmur towards the apex.  It was difficult to auscultate whether this was systolic or diastolic.  I suspect it is systolic.  Abdomen:  Soft, nontender, but distended.  No rebound or guarding.  I was not able to palpate the  liver or spleen.  GU:  The patient has some scrotal edema.  He also has penile edema.  Extremities:  Legs with significant lower extremity edema bilaterally with cellulitis in the left lower extremity.  The entire leg below the knee feels very hot and is tender to the touch.  No ulcers are seen.  Vascular:  I could not palpate dorsalis pedis and posterior tibial pulses bilaterally.  I did not examine the femoral pulses.  Neurologic:  Patient is alert and oriented and grossly nonfocal.  Skin:  Warm and dry.  Psychiatric:  Normal affect.   LABORATORY WORK:  White count is down to 7900, hemoglobin is 13.6, platelet count is 115,000.  Significant left shift on admission.  Chemistry:  Creatinine 0.7, GFR is greater than 60, potassium is 3.2.  Hemoglobin A1c is 9.6, ALT is 50, albumin is 3.3, lipase is 16.  Coagulation parameters within normal limits. Blood cultures, no growth after 48 hours.   IMAGING:  Twelve-lead EKG:  Normal sinus rhythm, left bundle branch block. >>>>>>>>>>>>>>>>>>>>>>>>>>>>>>>>  END OF PAGE  <<<<<<<<<<<<<<<<<<<<<<<<<<<<<<<<                                     [facility]                                       [place], [place]  [address]     NAME:  Babula,Hugh DAVID                   ROOM:          223-01                UNIT NUMBER:  [id number]                        LOCATION:      8F         ADM/VISIT DATE:     06/22/12                ADM PHYS:      Francetta Found MD   ACCT: [id number]                               DOB:           09-Nov-2050   CARDIOLOGY CONSULTATION REPORT         PROBLEM LIST: 1.   Status post hypotensive secondary to distribute shock likely with      bacterial infection, but negative blood cultures. 2.   Supraventricular tachycardia with left bundle branch block, likely in the      setting of hypokalemia. 3.   Unknown left ventricular function. 4.   Evidence of right heart failure symptoms with significant edema. 5.   Rule out obstructive sleep apnea.   PLAN: 1.   I attempted a bedside echocardiogram, but really could not visualize the      patient's heart. 2.   We will order an echocardiogram for tomorrow to make sure that he has      abnormal LV function, particularly in light of his left bundle branch      block.  I think a study might be technically difficult and we will  definitely have to use _____  for this study. 3.   If we are unable to visualize the heart with the transthoracic      echocardiogram, I think we probably should to a transesophageal      echocardiogram given the patient's extremely poor dentition and his      cellulitis, and his profound hypotension which was out of proportion to      the fact that we have negative blood cultures.  He also has a murmur on      exam, and patient is at risk for endocarditis. 4.   Presently I would correct his hypokalemia, and we will await for the      results of the echocardiogram for further recommendations. 5.   No further management of the brief runs of supraventricular tachycardia is      necessary, particularly because the patient is asymptomatic.        DICTATED NOT READ  (unless electronically signed)                                                ______________________________                                                Learta Codding, MD                               [facility]               D:  06/24/12 1925 T:  06/25/12 0037 P:  DEG        UI: [id number] copy for:                                cc:  Francetta Found MD                                          ===============================  END OF REPORT  ===============================

## 2012-06-26 NOTE — Progress Notes (Signed)
ANTIBIOTIC CONSULT NOTE - INITIAL  Pharmacy Consult for Vancomycin Indication: Empiric Cellulitis, Endocarditis, sepsis  No Known Allergies  Patient Measurements: Height: 5\' 10"  (177.8 cm) Weight: 315 lb 0.6 oz (142.9 kg) IBW/kg (Calculated) : 73   Vital Signs: Temp: 97.9 F (36.6 C) (12/20 1935) Temp src: Oral (12/20 1935) BP: 157/59 mmHg (12/20 1721) Pulse Rate: 82  (12/20 1721) Intake/Output from previous day:   Intake/Output from this shift:    Labs: No results found for this basename: WBC:3,HGB:3,PLT:3,LABCREA:3,CREATININE:3 in the last 72 hours CrCl is unknown because no creatinine reading has been taken. No results found for this basename: VANCOTROUGH:2,VANCOPEAK:2,VANCORANDOM:2,GENTTROUGH:2,GENTPEAK:2,GENTRANDOM:2,TOBRATROUGH:2,TOBRAPEAK:2,TOBRARND:2,AMIKACINPEAK:2,AMIKACINTROU:2,AMIKACIN:2, in the last 72 hours   Microbiology: No results found for this or any previous visit (from the past 720 hour(s)).  Medical History: Past Medical History  Diagnosis Date  . Poor dentition 06/26/2012    Severe dental caries and cavities  . Diabetes mellitus without complication   . Left leg cellulitis   . Obesity   . History of tobacco abuse   . Mitral valve vegetation     a. 06/25/2012 Echo: EF 45-50%, mod-sev conc LVH, nl RV, ? MV veg, Trace TR;  06/26/2012 TEE:  MV vegetation noted.    Medications:  Prescriptions prior to admission  Medication Sig Dispense Refill  . aspirin EC 81 MG tablet Take 81 mg by mouth daily.      Marland Kitchen gabapentin (NEURONTIN) 300 MG capsule Take 300 mg by mouth 3 (three) times daily.      Marland Kitchen glipiZIDE (GLUCOTROL) 10 MG tablet Take 10 mg by mouth 2 (two) times daily before a meal.      . meloxicam (MOBIC) 15 MG tablet Take 15 mg by mouth daily.      . metFORMIN (GLUCOPHAGE) 500 MG tablet Take 1,000 mg by mouth 2 (two) times daily with a meal.       . Multiple Vitamin (MULTIVITAMIN WITH MINERALS) TABS Take 1 tablet by mouth daily.      Marland Kitchen  olmesartan-hydrochlorothiazide (BENICAR HCT) 40-25 MG per tablet Take 1 tablet by mouth daily.      . Omega-3 Fatty Acids (FISH OIL PO) Take 1 capsule by mouth daily.      . sertraline (ZOLOFT) 100 MG tablet Take 100 mg by mouth daily.       Assessment: 61 yo M admited to [facility] hospital on 06/22/12 with severe hypotension requiring pressors, started on vancomycin and zosyn for leg cellulits. Transferred to [name] 06/26/2012 for evaluation after TEE revealed large mitral valve vegitation.   Patient on Vancomycin 1500 mg IV q8h at [facility], with am trough 12/20 reported as 27m9 mcg/ml last dose at 0230 12/20.    Goal of Therapy:  Vancomycin trough level 15-20 mcg/ml  Plan:  1) Follow up trough vancomycin concentration ordered here, when trough returns to acceptable range consider 1500 mg IV q12h unless otherwise clinically indicated. 2) Follow up SCr, UOP, cultures, clinical course and adjust as clinically indicated.  Thank you for allowing pharmacy to be a part of this patients care team.  Lovenia Kim Pharm.D., BCPS Clinical Pharmacist 06/26/2012 7:51 PM Pager: [phone] Phone: [phone]

## 2012-06-26 NOTE — Discharge Summary (Signed)
Name: Cody Hale,Cody Hale                                           Phys: Francetta Found MD                                           DOB: September 17, 1950 Age: 61      Sex: M                                           Acct: [id number]    Loc: 216 01                                             Exam Date: 06/25/2012 Status: ADM IN                                           Radiology No: [id number]                                           Unit No: [id number]                                               Telephone No: [phone]      EXAMS:  ECHO/ECHO COMPLETE W CONTRAST           [id number]       Procedure:   Transthoracic Echocardiogram          Patient:     Cody Hale              DOB(Age): 1951-03-10(61)       Med Rec#:    [id number]                          Sex:      M                    Date:        06/25/2012                      Height:   177(cm)/69(in)       Account#:    [id number]                         Weight:        141(kg)/310(lbs      Accession#:  [id number]                       BSA:      2.51      Room#:       223  Type:     Inpatient        Referring:   Erasmo Downer, S                    Reading:     Lewayne Bunting, M.D.                      Sonographer: Jake Seats, RVT,RDCS              CPT Codes(s):        Echocardiogram, Complete (93306)        HR            BP      76            90/47          Indication(s):  HYPOTENSIVE,SVT       MEASUREMENTS:       Chambers                          Value   Units (Range)             RVIDd               30      mm (8 to 26)              IVSd                20.7    mm (3 to 11)              IVSs                17.5    mm                        LVIDd               58.8    mm (38 to 57)             LVIDs               42.9    mm (22 to 40)             LVPWd               18.2    mm (7 to 11)               PAGE 1               Signed Report Printed From PCI     (CONTINUED)                                            Name: Cody Hale,Cody Hale                                           Phys: Francetta Found MD                                           DOB: 1950-09-20 Age: 61      Sex: M  Acct: [id number]    Loc: 216 01                                             Exam Date: 06/25/2012 Status: ADM IN                                           Radiology No: [id number]                                           Unit No: [id number]                                               Telephone No: [phone]      EXAMS:  ECHO/ECHO COMPLETE W CONTRAST           [id number]        <Continued>       LVPWs               23.2    mm (7 to 11)              LA Diam             45      mm (15 to 40)             Ao Diam             38      mm                        EF (Teich)          52      %                          Aortic Valve                          Value   Units (Range)             LVOT Vmax           1.44    m/s (1 to 1.7)            LVOT Max Pk Grad    8       mmHg                      LVMeanPG            5       mmHg                      LVOT VTI            29.9    cm                        LVOT Vmean          1.05  m/s                       AV (Vmax)           1.88    m/sec                     AV Pk Grad          14      mmHg (Less Than 36)                AV Mn Pk Grad       8       mmHg (Less Than 20)                AV VTI              38.1    cm                        AV Vmean            1.36    m/s                       LVOT Diam           23      mm (17 to 25)             AVA  (VTI)          3.26    cm2                       AVA Vmax            3.18    cm2                        Mitral Valve                          Value   Units (Range)             MVDecTime           172     ms                        A Vel               0.809   m/sec                     E/A                 1.1     ratio (1.1 to 1.5)         MV Pk Vel           0.859   m/sec (0.6 to 1.3)        MV Pk Grad          3       mmHg                      MV Pk Mn Grad       2       mmHg                      MV V Mean  0.652   m/sec                     MVPHT               56      msec                      MV VTI              31.9    cm                        MVA (PHT)           3.93    cm2                       MVA (Cont)          3.89    cm2                       MR Vmax             2.39    m/sec                      PAGE 2               Signed Report Printed From PCI    (CONTINUED)                                            Name: Cody Hale,Cody Hale                                           Phys: Francetta Found MD                                           DOB: 1950-12-13 Age: 57      Sex: M                                           Acct: [id number]    Loc: 216 01                                             Exam Date: 06/25/2012 Status: ADM IN                                           Radiology No: [id number]                                           Unit No: [id number]  Telephone No: [phone]      EXAMS:  ECHO/ECHO COMPLETE W CONTRAST           [id number]        <Continued>       MR VTI              31.9    cm                         Tricuspid/Pulmonic Valves                          Value   Units (Range)             TRMaxPG             13      mmHg                      RAP                 10      mmHg                      RVSP                23      mmHg                        FINDINGS        Technical Comments:      The study quality is good.         Left Ventricle:      The left ventricular chamber size is mildly dilated. Moderate to      severe concentric left ventricular hypertrophy is observed. There are      multiple regional wall motion abnormalities.  There is mildly      decreased left ventricular systolic function. The estimated ejection      fraction is  45-50%.  Abnormal left ventricular diastolic function is      observed. The left ventricular diastolic filling pattern is consistent      with pseudonormalization.  The left ventricular diastolic filling      pattern is consistent with elevated mean left atrial pressure. The       basal inferoseptal, mid anteroseptal, mid inferolateral, mid inferior,      mid inferoseptal, apical septal, apical anterior, apical lateral and      apical inferior wall segments are hypokinetic (score 2). Overall      wallmotion score index is 2.00        Left Atrium:      The left atrium is mild to moderately dilated.         Right Ventricle:      The right ventricular cavity size is normal. The right ventricular      global systolic function is hyperdynamic.         PAGE 3               Signed Report Printed From PCI    (CONTINUED)                                            Name: Cody Hale,Cody Hale  Phys: Francetta Found MD                                           DOB: 1951-05-03 Age: 54      Sex: M                                           Acct: [id number]    Loc: 216 01                                             Exam Date: 06/25/2012 Status: ADM IN                                           Radiology No: [id number]                                           Unit No: [id number]                                               Telephone No: [phone]      EXAMS:  ECHO/ECHO COMPLETE W CONTRAST           [id number]        <Continued>       Right Atrium:      The right atrial cavity size is normal.        Aortic Valve:      The aortic valve is not well visualized. The aortic valve structure is      normal. Systolic excursion of the aortic valve is normal. Mild aortic      cusp sclerosis is present. There is no evidence of aortic      regurgitation.        Mitral Valve:      The mitral valve is not well visualized. The mitral valve leaflets are      mildly  thickened. There is mild mitral regurgitation.  Chordal      systolic anterior motion is visualized. (However, the appearance is      somewhat unusual with excessive mobility and suspect this could      represent an anterior mitral valve vegetation. )        Tricuspid Valve:      The tricuspid valve is not well visualized. The tricuspid valve      leaflets are mildly thickened. There is a trace tricuspid      regurgitation.  The right ventricular systolic pressure is calculated      at 23 mmHg.  The right ventricular systolic pressure is estimated to      be 20-25 mmHg.  There is evidence that pulmonary hypertension may be      underestimated.        Pulmonic Valve:      The pulmonic valve  is not well visualized. The pulmonic valve appears      normal.        Pericardium:      The pericardium appears normal. There is no pericardial effusion.        Aorta:      There is mild dilatation of the ascending aorta.        Pulmonary Artery:       PAGE 4               Signed Report Printed From [facility]    (CONTINUED)                                            Name: Cody Hale,Cody Hale                                           Phys: Francetta Found MD                                           DOB: Dec 03, 1950 Age: 59      Sex: M                                           Acct: [id number]    Loc: 216 01                                             Exam Date: 06/25/2012 Status: ADM IN                                           Radiology No: [id number]                                           Unit No: [id number]                                               Telephone No: [phone]      EXAMS:  ECHO/ECHO COMPLETE W CONTRAST           [id number]        <Continued>       The main pulmonary artery is not well visualized.        Venous:      The inferior vena cava appears normal. The inferior vena cava is      dilated.  There is a greater than 50% respiratory change in the      inferior vena cava  dimension.        Contrast:      Definity was used to optimize study.  Intravenous contrast was  used to      enhance endocardial border definition.         Conclusions:      1. The estimated ejection fraction is 45-50%.       2. There are multiple regional wall motion abnormalities.       3. The left ventricular diastolic filling pattern is consistent with      pseudonormalization.       4. The left ventricular diastolic filling pattern is consistent with      elevated mean left atrial pressure.      5. The left atrium is mild to moderately dilated.       6. Moderate to severe concentric left ventricular hypertrophy is      observed.      7. The right ventricular global systolic function is hyperdynamic.      8. The mitral valve is not well visualized.      9. Chordal systolic anterior motion is visualized.      10. However, the appearance is somewhat unusual with excessive      mobility and suspect this could represent an anterior mitral valve      vegetation.       11. There is a trace tricuspid regurgitation.       12. The right ventricular systolic pressure is calculated at 23 mmHg.       13. There is evidence that pulmonary hypertension may be      underestimated.      14. There is no pericardial effusion.      15. There is mild dilatation of the ascending aorta.       PAGE 5               Signed Report Printed From [facility]    (CONTINUED)                                            Name: Cody Hale,Cody Hale                                           Phys: Francetta Found MD                                           DOB: 04/14/1951 Age: 59      Sex: M                                           Acct: [id number]    Loc: 216 01                                             Exam Date: 06/25/2012 Status: ADM IN                                           Radiology No: [id number]  Unit No: [id number]                                               Telephone No:  [phone]      EXAMS:  ECHO/ECHO COMPLETE W CONTRAST           [id number]        <Continued>       16. Definity was used to optimize study.       17. Intravenous contrast was used to enhance endocardial border      definition.        Electronically signed at 06/25/2012 11:42:13 by: Lewayne Bunting, M.D.         Final: Adline Mango, MD                 * 06/25/2012 (1143)                     <->     BY                               Reported By: Adline Mango, MD

## 2012-06-27 ENCOUNTER — Inpatient Hospital Stay (HOSPITAL_COMMUNITY): Payer: Self-pay

## 2012-06-27 DIAGNOSIS — F32A Depression, unspecified: Secondary | ICD-10-CM | POA: Diagnosis present

## 2012-06-27 DIAGNOSIS — E669 Obesity, unspecified: Secondary | ICD-10-CM | POA: Insufficient documentation

## 2012-06-27 DIAGNOSIS — E66812 Obesity, class 2: Secondary | ICD-10-CM | POA: Diagnosis present

## 2012-06-27 DIAGNOSIS — F329 Major depressive disorder, single episode, unspecified: Secondary | ICD-10-CM | POA: Diagnosis present

## 2012-06-27 DIAGNOSIS — D649 Anemia, unspecified: Secondary | ICD-10-CM | POA: Diagnosis present

## 2012-06-27 DIAGNOSIS — E119 Type 2 diabetes mellitus without complications: Secondary | ICD-10-CM | POA: Diagnosis present

## 2012-06-27 DIAGNOSIS — I330 Acute and subacute infective endocarditis: Secondary | ICD-10-CM | POA: Diagnosis present

## 2012-06-27 DIAGNOSIS — I38 Endocarditis, valve unspecified: Secondary | ICD-10-CM

## 2012-06-27 DIAGNOSIS — D696 Thrombocytopenia, unspecified: Secondary | ICD-10-CM | POA: Diagnosis present

## 2012-06-27 DIAGNOSIS — Z87442 Personal history of urinary calculi: Secondary | ICD-10-CM | POA: Diagnosis present

## 2012-06-27 DIAGNOSIS — N289 Disorder of kidney and ureter, unspecified: Secondary | ICD-10-CM | POA: Diagnosis present

## 2012-06-27 DIAGNOSIS — I340 Nonrheumatic mitral (valve) insufficiency: Secondary | ICD-10-CM | POA: Diagnosis present

## 2012-06-27 HISTORY — DX: Morbid (severe) obesity due to excess calories: E66.01

## 2012-06-27 LAB — URINE MICROSCOPIC-ADD ON

## 2012-06-27 LAB — BASIC METABOLIC PANEL
BUN: 46 mg/dL — ABNORMAL HIGH (ref 6–23)
CO2: 20 mEq/L (ref 19–32)
Calcium: 8.6 mg/dL (ref 8.4–10.5)
Chloride: 97 mEq/L (ref 96–112)
Creatinine, Ser: 6.15 mg/dL — ABNORMAL HIGH (ref 0.50–1.35)
GFR calc Af Amer: 10 mL/min — ABNORMAL LOW (ref >90)
GFR calc non Af Amer: 9 mL/min — ABNORMAL LOW (ref >90)
Glucose, Bld: 147 mg/dL — ABNORMAL HIGH (ref 70–99)
Potassium: 3.9 mEq/L (ref 3.5–5.1)
Sodium: 131 mEq/L — ABNORMAL LOW (ref 135–145)

## 2012-06-27 LAB — URINALYSIS, ROUTINE W REFLEX MICROSCOPIC
Bilirubin Urine: NEGATIVE
Glucose, UA: NEGATIVE mg/dL
Ketones, ur: NEGATIVE mg/dL
Leukocytes, UA: NEGATIVE
Nitrite: NEGATIVE
Protein, ur: NEGATIVE mg/dL
Specific Gravity, Urine: 1.007 (ref 1.005–1.030)
Urobilinogen, UA: 0.2 mg/dL (ref 0.0–1.0)
pH: 5 (ref 5.0–8.0)

## 2012-06-27 LAB — CREATININE, URINE, RANDOM: Creatinine, Urine: 50.72 mg/dL

## 2012-06-27 LAB — GLUCOSE, CAPILLARY
Glucose-Capillary: 141 mg/dL — ABNORMAL HIGH (ref 70–99)
Glucose-Capillary: 156 mg/dL — ABNORMAL HIGH (ref 70–99)
Glucose-Capillary: 122 mg/dL — ABNORMAL HIGH (ref 70–99)
Glucose-Capillary: 138 mg/dL — ABNORMAL HIGH (ref 70–99)

## 2012-06-27 LAB — CBC
HCT: 35.9 % — ABNORMAL LOW (ref 39.0–52.0)
Hemoglobin: 12.3 g/dL — ABNORMAL LOW (ref 13.0–17.0)
MCH: 31.6 pg (ref 26.0–34.0)
MCHC: 34.3 g/dL (ref 30.0–36.0)
MCV: 92.3 fL (ref 78.0–100.0)
Platelets: 136 10*3/uL — ABNORMAL LOW (ref 150–400)
RBC: 3.89 MIL/uL — ABNORMAL LOW (ref 4.22–5.81)
RDW: 13.4 % (ref 11.5–15.5)
WBC: 6.8 10*3/uL (ref 4.0–10.5)

## 2012-06-27 LAB — SODIUM, URINE, RANDOM: Sodium, Ur: 23 mEq/L

## 2012-06-27 MED ORDER — GLIPIZIDE 10 MG PO TABS
10.0000 mg | ORAL_TABLET | Freq: Every day | ORAL | Status: DC
  Administered 2012-06-28 – 2012-07-15 (×16): 10 mg via ORAL
  Filled 2012-06-27 (×19): qty 1

## 2012-06-27 MED ORDER — GABAPENTIN 100 MG PO CAPS
100.0000 mg | ORAL_CAPSULE | Freq: Three times a day (TID) | ORAL | Status: DC
  Administered 2012-06-27 – 2012-07-15 (×53): 100 mg via ORAL
  Filled 2012-06-27 (×59): qty 1

## 2012-06-27 MED ORDER — SODIUM CHLORIDE 0.9 % IJ SOLN
10.0000 mL | Freq: Two times a day (BID) | INTRAMUSCULAR | Status: DC
  Administered 2012-06-27 – 2012-07-15 (×10): 10 mL via INTRAVENOUS

## 2012-06-27 MED ORDER — SODIUM CHLORIDE 0.9 % IV SOLN
INTRAVENOUS | Status: DC
  Administered 2012-06-27 – 2012-06-30 (×4): via INTRAVENOUS
  Administered 2012-07-01: 1000 mL via INTRAVENOUS

## 2012-06-27 NOTE — Progress Notes (Signed)
Subjective:   61 y/o obese male with DM2. Recent LLE cellulitis. Admitted to [facility] with septic shock. Found to have large MV vegetation. Now with acute renal failure. Still making urine   Denies CP or SOB. No fevers or chills.    Intake/Output Summary (Last 24 hours) at 06/27/12 1120 Last data filed at 06/27/12 0400  Gross per 24 hour  Intake    540 ml  Output    650 ml  Net   -110 ml    Current meds:    . aspirin EC  81 mg Oral Daily  . gabapentin  300 mg Oral TID  . glipiZIDE  10 mg Oral BID AC  . heparin  5,000 Units Subcutaneous Q8H  . insulin aspart  0-15 Units Subcutaneous TID WC  . insulin glargine  20 Units Subcutaneous QHS  . multivitamin with minerals  1 tablet Oral Daily  . omega-3 acid ethyl esters  1 g Oral Daily  . sertraline  100 mg Oral Daily  . sodium chloride  3 mL Intravenous Q12H   Infusions:     Objective:  Blood pressure 114/57, pulse 80, temperature 98 F (36.7 C), temperature source Oral, resp. rate 15, height 5\' 10"  (1.778 m), weight 143.7 kg (316 lb 12.8 oz), SpO2 97.00%. Weight change:   Physical Exam: General: Pleasant, NAD  Psych: Normal affect.  Neuro: Alert and oriented X 3. Moves all extremities spontaneously.  HEENT: Normal with exception of poor dentition.  Neck: Supple, obese, without bruits. Difficult to assess JVP. + central line Lungs: Resp regular and unlabored, CTA.  Heart: RRR no s3, s4, 2/6 syst murmur heard best @ llsb. No MR heard Abdomen: Soft, non-tender, protuberant, BS + x 4.  Extremities: No clubbing, cyanosis. Left lower leg with tr edema and no erythema. Trace edema on the right. DP/PT/Radials 2+ and equal bilaterally. No splinter, janeway or Osler's lesions   Telemetry:  SR with LBBB in 80s. No svt  Lab Results: Basic Metabolic Panel:  Lab 06/27/12 1610 06/26/12 2219  NA 131* 129*  K 3.9 3.9  CL 97 95*  CO2 20 19  GLUCOSE 147* 187*  BUN 46* 41*  CREATININE 6.15* 5.65*  CALCIUM 8.6 8.7  MG -- --    PHOS -- --   Liver Function Tests:  Lab 06/26/12 2219  AST 24  ALT 31  ALKPHOS 70  BILITOT 0.4  PROT 6.4  ALBUMIN 3.0*   No results found for this basename: LIPASE:5,AMYLASE:5 in the last 168 hours No results found for this basename: AMMONIA:5 in the last 168 hours CBC:  Lab 06/27/12 0437 06/26/12 2219  WBC 6.8 7.6  NEUTROABS -- --  HGB 12.3* 12.8*  HCT 35.9* 38.1*  MCV 92.3 92.0  PLT 136* 133*   Cardiac Enzymes: No results found for this basename: CKTOTAL:5,CKMB:5,CKMBINDEX:5,TROPONINI:5 in the last 168 hours BNP: No components found with this basename: POCBNP:5 CBG:  Lab 06/27/12 0807 06/26/12 2144 06/26/12 1749  GLUCAP 141* 181* 166*   Microbiology: No results found for this basename: cult   No results found for this basename: CULT:2,SDES:2 in the last 168 hours  Imaging: Dg Orthopantogram  06/27/2012  *RADIOLOGY REPORT*  Clinical Data: Preoperative evaluation in patient with endocarditis and dental disease.  ORTHOPANTOGRAM/PANORAMIC  Comparison: None.  Findings: Multiple dental caries involving what I believe are teeth numbers 2, 3, 7, 15, and likely 18.  The central incisors were suboptimally imaged.  No periapical lucencies involving any of the teeth to  suggest an abscess.  IMPRESSION: Multiple dental caries.   Original Report Authenticated By: Hulan Saas, M.D.      ASSESSMENT:  1. Endocarditis with large MV vegetation (culture negative to date though did receive vanc prior to [facility])    --EF 45-50% mod-severe LVH. Normal RV. No MR mentioned. 2. Septic shock - improving 3. Morbid obesity 4. DM2  5. LLE cellulitis 6. SVT  7. Poor dentition - dental consult pending 8. Acute renal failure 9. LBBB  PLAN/DISCUSSION:  Hemodynamically stable. BP much improved. Appreciate ID recs regarding abx. CVTS to see Monday.  Renal function getting worse likely due to hypotension (ATN) and not septic emboli. No current indications for HD. Will consult Renal.  Hopefully ATN will resolve.   Will decrease gabapentin and glipizide in setting of renal failure.   LOS: 1 day    Arvilla Meres, MD 06/27/2012, 11:20 AM

## 2012-06-27 NOTE — Progress Notes (Signed)
61yo male with acute renal failure in setting of vancomycin toxicity remains with very high random vanc level (63.6) and further increase in SCr (5.65) compared to labs at OSH.  Will continue to hold off on dosing vanc for now and obtain another random level with am labs on Sunday (earlier if SCr begins to improve rapidly).  Vernard Gambles, PharmD, BCPS 06/27/2012 12:05 AM

## 2012-06-27 NOTE — Consult Note (Signed)
[facility]    Date of Admission:  06/26/2012           Day 5 vancomycin       Reason for Consult: Mitral valve endocarditis    Referring Physician: Dr. Angelina Sheriff  Principal Problem:  *Endocarditis Active Problems:  Recurrent cellulitis of lower leg  Type 2 diabetes mellitus  SVT (supraventricular tachycardia)  Acute renal insufficiency  Poor dentition  Obesity  Normocytic anemia  Thrombocytopenia  History of kidney stones  Depression      . aspirin EC  81 mg Oral Daily  . gabapentin  300 mg Oral TID  . glipiZIDE  10 mg Oral BID AC  . heparin  5,000 Units Subcutaneous Q8H  . insulin aspart  0-15 Units Subcutaneous TID WC  . insulin glargine  20 Units Subcutaneous QHS  . multivitamin with minerals  1 tablet Oral Daily  . omega-3 acid ethyl esters  1 g Oral Daily  . sertraline  100 mg Oral Daily  . sodium chloride  3 mL Intravenous Q12H    Recommendations: 1. Agree with holding vancomycin and monitoring vancomycin blood levels 2. Consider renally adjusted IV daptomycin when vancomycin levels fall below therapeutic range   Assessment: He has a subacute illness complicated by acute, recurrent left lower extremity cellulitis. He has a large mitral valve vegetation suggesting that his smoldering illness over the past month is probably due to subacute endocarditis. He has negative blood cultures but I cannot be certain if this is true culture-negative endocarditis or if his cultures are negative because of having received a dose of vancomycin prior to the cultures being drawn. He has improved suggesting that his endocarditis is probably do to the vancomycin susceptible organism such as strep, staph or enterococcus. I do not feel the need to add broader coverage. Once his vancomycin levels fall I would consider her switching to renally dosed daptomycin. I will followup tomorrow.    HPI: Cody Hale is a 61 y.o. male who began to feel  sluggish and weak shortly after Thanksgiving. His wife also noted that he seemed to be somewhat unsteady on his feet upon first standing. His appetite was poor but he does not believe he lost any weight. Early in the morning of December 16 he had the sudden onset of subjective fever, rigors and extreme weakness and was taken to [facility] in [place], [place]. He was noted have fever, left lower extremity cellulitis and hypotension. He has had 2 previous bouts of left lower extremity cellulitis the last about one year ago. He was treated with fluids and IV vancomycin. Blood cultures done on December 16 are negative to date. I do not know with certainty whether or not he received any antibiotics prior to the blood cultures being drawn. He was not on any antibiotic therapy prior to admission to the hospital.  There was suspicion for a mitral valve vegetation on his transthoracic echocardiogram and a transesophageal echocardiogram done yesterday revealed a large, mobile mitral valve vegetation leading to his transfer here. He is feeling much better. His shock and cellulitis have resolved. Unfortunately, he has developed acute renal insufficiency probably due to the combination of infection, hypotension and vancomycin. Yesterday his vancomycin level was noted to be 77. His vancomycin has been held since that time.   Review of Systems: Pertinent items are noted in HPI.  Past Medical History  Diagnosis Date  . Poor dentition 06/26/2012  Severe dental caries and cavities  . Diabetes mellitus without complication   . Left leg cellulitis   . Obesity   . History of tobacco abuse   . Mitral valve vegetation     a. 06/25/2012 Echo: EF 45-50%, mod-sev conc LVH, nl RV, ? MV veg, Trace TR;  06/26/2012 TEE:  MV vegetation noted.  . Hypertension   . Heart murmur   . Arthritis     History  Substance Use Topics  . Smoking status: Former Games developer  . Smokeless tobacco: Not on file     Comment: smoked  about 1.5ppd x 15 yrs, quit 15 yrs ago.  . Alcohol Use: No    Family History  Problem Relation Age of Onset  . Lung cancer Father     died @ 25  . Ovarian cancer Mother     died @ 65   No Known Allergies  OBJECTIVE: Blood pressure 114/57, pulse 80, temperature 98 F (36.7 C), temperature source Oral, resp. rate 15, height 5\' 10"  (1.778 m), weight 143.7 kg (316 lb 12.8 oz), SpO2 97.00%. General: He is alert, comfortable eating breakfast while watching TV Skin: His left lower extremity cellulitis has resolved. He has no splinter or conjunctival hemorrhages Oral: Some missing teeth and remaining teeth are in poor condition Lungs: Clear Cor: Regular S1 and S2 with a 1/6 systolic murmur at the left sternal border. He has distant heart sounds. Abdomen: Obese, soft and nontender Joints extremities: No acute abnormality.  Microbiology: Recent Results (from the past 240 hour(s))  MRSA PCR SCREENING     Status: Normal   Collection Time   06/26/12  5:17 PM      Component Value Range Status Comment   MRSA by PCR NEGATIVE  NEGATIVE Final     Cliffton Asters, MD [facility] [phone] pager   [phone] cell 06/27/2012, 9:45 AM

## 2012-06-27 NOTE — Progress Notes (Signed)
Pts. Am labs resulted this am, BUN and creatinine trending up. Dr. Shaune Spittle made aware. Will follow up this morning rounds.
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
## 2012-06-27 NOTE — Progress Notes (Addendum)
Lab. Called for critical value of vancomycin level-63.6. J.Earl  PharmD from the pharmacy was notified and made aware. With order to do another vanc. level in am and will hold vancomycin dose for now.

## 2012-06-28 LAB — GLUCOSE, CAPILLARY
Glucose-Capillary: 140 mg/dL — ABNORMAL HIGH (ref 70–99)
Glucose-Capillary: 157 mg/dL — ABNORMAL HIGH (ref 70–99)
Glucose-Capillary: 186 mg/dL — ABNORMAL HIGH (ref 70–99)

## 2012-06-28 LAB — BASIC METABOLIC PANEL
BUN: 59 mg/dL — ABNORMAL HIGH (ref 6–23)
CO2: 17 mEq/L — ABNORMAL LOW (ref 19–32)
Calcium: 8.9 mg/dL (ref 8.4–10.5)
Chloride: 102 mEq/L (ref 96–112)
Creatinine, Ser: 7.56 mg/dL — ABNORMAL HIGH (ref 0.50–1.35)
GFR calc Af Amer: 8 mL/min — ABNORMAL LOW (ref >90)
GFR calc non Af Amer: 7 mL/min — ABNORMAL LOW (ref >90)
Glucose, Bld: 162 mg/dL — ABNORMAL HIGH (ref 70–99)
Potassium: 4.6 mEq/L (ref 3.5–5.1)
Sodium: 136 mEq/L (ref 135–145)

## 2012-06-28 LAB — VANCOMYCIN, RANDOM: Vancomycin Rm: 45.9 ug/mL

## 2012-06-28 MED ORDER — SODIUM CHLORIDE 0.9 % IV SOLN
6.0000 mg/kg | INTRAVENOUS | Status: DC
  Administered 2012-06-28 – 2012-06-30 (×2): 864.5 mg via INTRAVENOUS
  Filled 2012-06-28 (×4): qty 17.29

## 2012-06-28 MED ORDER — SODIUM CHLORIDE 0.9 % IV SOLN
6.0000 mg/kg | INTRAVENOUS | Status: DC
  Filled 2012-06-28: qty 16.8

## 2012-06-28 NOTE — Progress Notes (Signed)
ANTIBIOTIC CONSULT NOTE - INITIAL  Pharmacy Consult for Daptomycin Indication: Mitral valve endocarditis   No Known Allergies  Patient Measurements: Height: 5\' 10"  (177.8 cm) Weight: 317 lb 10.9 oz (144.1 kg) IBW/kg (Calculated) : 73   Vital Signs: Temp: 98.3 F (36.8 C) (12/22 1200) Temp src: Oral (12/22 1200) BP: 157/69 mmHg (12/22 1200) Pulse Rate: 71  (12/22 1200) Intake/Output from previous day: 12/21 0701 - 12/22 0700 In: 1456.3 [P.O.:240; I.V.:1216.3] Out: 2225 [Urine:2225] Intake/Output from this shift: Total I/O In: 440 [P.O.:440] Out: 500 [Urine:500]  Labs:  [facility] 06/28/12 1030 06/27/12 1433 06/27/12 0437 06/26/12 2219  WBC -- -- 6.8 7.6  HGB -- -- 12.3* 12.8*  PLT -- -- 136* 133*  LABCREA -- 50.72 -- --  CREATININE 7.56* -- 6.15* 5.65*   Estimated Creatinine Clearance: 14.7 ml/min (by C-G formula based on Cr of 7.56).  Basename 06/28/12 0500 06/26/12 2219  VANCOTROUGH -- --  VANCOPEAK -- --  VANCORANDOM 45.9 63.6*  GENTTROUGH -- --  GENTPEAK -- --  GENTRANDOM -- --  TOBRATROUGH -- --  TOBRAPEAK -- --  TOBRARND -- --  AMIKACINPEAK -- --  AMIKACINTROU -- --  AMIKACIN -- --     Microbiology: Recent Results (from the past 720 hour(s))  MRSA PCR SCREENING     Status: Normal   Collection Time   06/26/12  5:17 PM      Component Value Range Status Comment   MRSA by PCR NEGATIVE  NEGATIVE Final     Medical History: Past Medical History  Diagnosis Date  . Poor dentition 06/26/2012    Severe dental caries and cavities  . Diabetes mellitus without complication   . Left leg cellulitis   . Obesity   . History of tobacco abuse   . Mitral valve vegetation     a. 06/25/2012 Echo: EF 45-50%, mod-sev conc LVH, nl RV, ? MV veg, Trace TR;  06/26/2012 TEE:  MV vegetation noted.  . Hypertension   . Heart murmur   . Arthritis     Assessment: 61 y.o. M started on Vanc + Zosyn at [facility] for leg cellulitis then transferred to [facility] on 12/20 after TEE  revealed MV vegetation. A Vancomycin trough at [facility] prior to transfer revealed a vancomycin level of 77 mcg/ml. Random levels here at [facility] shown slow elimination (Vanc trough 63.6 on 12/20 @ 2200; Vanc trough 45.9 on 12/22 @ 0500). At the current rate of elimination, it is estimated that it will take ~4-5 days for the patient to reach SUBtherapeutic Vancomycin levels for endocarditis treatment.   ID is following the patient and have ordered Daptomycin to be started today. Dr. Orvan Falconer is aware that Vancomycin remains therapeutic and still desires to go ahead with Daptomycin dosing starting today. Will utilize the patient's total body weight for dosing at a reduced interval due to the patient's acute renal failure. SCr 7.56 << 6.15, CrCl~10-15 ml/min, UOP 0.5 ml/kg/hr.   Goal of Therapy:  Proper antibiotics for infection/cultures adjusted for renal/hepatic function   Plan:  1. Daptomycin 864 mg (~6 mg/kg) every 48 hours (will wait to start doses this evening) 2. Will order a baseline CK, followed by weekly monitoring 3. Will continue to follow renal function, culture results, and LOT, and antibiotic de-escalation plans  Georgina Pillion, PharmD, BCPS Clinical Pharmacist Pager: [phone] 06/28/2012 2:08 PM

## 2012-06-28 NOTE — Progress Notes (Signed)
Patient ID: Cody Hale, male   DOB: October 01, 1950, 61 y.o.   MRN: [id number]    [facility]    Date of Admission:  06/26/2012           Day 6 vancomycin (his vancomycin has been discontinued but his level is still likely to be therapeutic for several days)  Principal Problem:  *Endocarditis Active Problems:  Recurrent cellulitis of lower leg  Type 2 diabetes mellitus  SVT (supraventricular tachycardia)  Acute renal insufficiency  Poor dentition  Obesity  Normocytic anemia  Thrombocytopenia  History of kidney stones  Depression  Subjective: He is feeling much better. He has been up walking in his room and has been able to go to the bathroom on his own. He still has a little bit of soreness around his left ankle.  Objective: Temp:  [97.9 F (36.6 C)-99 F (37.2 C)] 98.3 F (36.8 C) (12/22 1200) Pulse Rate:  [71-90] 71  (12/22 1200) Resp:  [8-19] 16  (12/21 2335) BP: (126-157)/(28-71) 157/69 mmHg (12/22 1200) SpO2:  [94 %-100 %] 100 % (12/22 1200) Weight:  [144.1 kg (317 lb 10.9 oz)] 144.1 kg (317 lb 10.9 oz) (12/22 0500)  General: He is comfortable and in good spirits sitting up watching TV Skin: No splinter or conjunctival hemorrhages Lungs: Clear Cor: Regular S1 and S2 with a 2/6 systolic murmur  Abdomen: Obese, soft and nontender Left leg: Moderate nonpitting edema to the knee. No redness but there is some slight warmth and tenderness to palpation around his ankle  Lab Results Lab Results  Component Value Date   WBC 6.8 06/27/2012   HGB 12.3* 06/27/2012   HCT 35.9* 06/27/2012   MCV 92.3 06/27/2012   PLT 136* 06/27/2012    Lab Results  Component Value Date   CREATININE 7.56* 06/28/2012   BUN 59* 06/28/2012   NA 136 06/28/2012   K 4.6 06/28/2012   CL 102 06/28/2012   CO2 17* 06/28/2012    Lab Results  Component Value Date   ALT 31 06/26/2012   AST 24 06/26/2012   ALKPHOS 70 06/26/2012   BILITOT 0.4 06/26/2012        Microbiology: Recent Results (from the past 240 hour(s))  MRSA PCR SCREENING     Status: Normal   Collection Time   06/26/12  5:17 PM      Component Value Range Status Comment   MRSA by PCR NEGATIVE  NEGATIVE Final     Studies/Results: Dg Orthopantogram  06/27/2012  *RADIOLOGY REPORT*  Clinical Data: Preoperative evaluation in patient with endocarditis and dental disease.  ORTHOPANTOGRAM/PANORAMIC  Comparison: None.  Findings: Multiple dental caries involving what I believe are teeth numbers 2, 3, 7, 15, and likely 18.  The central incisors were suboptimally imaged.  No periapical lucencies involving any of the teeth to suggest an abscess.  IMPRESSION: Multiple dental caries.   Original Report Authenticated By: Hulan Saas, M.D.    US Renal  06/27/2012  *RADIOLOGY REPORT*  Clinical Data: Acute renal failure, diabetes, obesity  RENAL/URINARY TRACT ULTRASOUND COMPLETE  Comparison:  12/07/2010  Findings:  Right Kidney:  14.5 cm. No hydronephrosis or focal mass.  Left Kidney:  15.2 cm.  Not optimally visualized due to body habitus but no focal abnormality or hydronephrosis identified.  Bladder:  Normal.  IMPRESSION: Normal exam.   Original Report Authenticated By: Christiana Pellant, M.D.     Assessment: He is improving on therapy for left leg cellulitis and mitral valve  endocarditis. Blood cultures have [facility] have been negative. Given his improvement on vancomycin I think it is fair to say that he probably has infection do to gram-positive cocci. Given his acute renal insufficiency that is probably, in part, related to vancomycin I will treat him with IV daptomycin.  Plan: 1. Start daptomycin  Cliffton Asters, MD [facility] [phone] pager   [phone] cell 06/28/2012, 12:48 PM

## 2012-06-28 NOTE — Progress Notes (Signed)
Subjective: Up in chair, feeling better, no complaints, no nausea, fatigue , good uop 2.4 L yesterday  Objective Vital signs in last 24 hours: Filed Vitals:   06/27/12 2335 06/28/12 0500 06/28/12 0739 06/28/12 1200  BP: 129/65 154/71 127/28 157/69  Pulse: 81 87 77 71  Temp: 98.5 F (36.9 C) 97.9 F (36.6 C) 98 F (36.7 C) 98.3 F (36.8 C)  TempSrc: Oral Oral Oral Oral  Resp: 16     Height:      Weight:  144.1 kg (317 lb 10.9 oz)    SpO2: 94% 96% 98% 100%   Weight change: 1.2 kg (2 lb 10.3 oz)  Intake/Output Summary (Last 24 hours) at 06/28/12 1416 Last data filed at 06/28/12 1300  Gross per 24 hour  Intake 1896.25 ml  Output   2425 ml  Net -528.75 ml   Labs: Basic Metabolic Panel:  Lab 06/28/12 0981 06/27/12 0437 06/26/12 2219  NA 136 131* 129*  K 4.6 3.9 3.9  CL 102 97 95*  CO2 17* 20 19  GLUCOSE 162* 147* 187*  BUN 59* 46* 41*  CREATININE 7.56* 6.15* 5.65*  ALB -- -- --  CALCIUM 8.9 8.6 8.7  PHOS -- -- --   Liver Function Tests:  Lab 06/26/12 2219  AST 24  ALT 31  ALKPHOS 70  BILITOT 0.4  PROT 6.4  ALBUMIN 3.0*   No results found for this basename: LIPASE:3,AMYLASE:3 in the last 168 hours No results found for this basename: AMMONIA:3 in the last 168 hours CBC:  Lab 06/27/12 0437 06/26/12 2219  WBC 6.8 7.6  NEUTROABS -- --  HGB 12.3* 12.8*  HCT 35.9* 38.1*  MCV 92.3 92.0  PLT 136* 133*   PT/INR: @labrcntip (inr:5) Cardiac Enzymes: No results found for this basename: CKTOTAL:5,CKMB:5,CKMBINDEX:5,TROPONINI:5 in the last 168 hours CBG:  Lab 06/28/12 1133 06/28/12 0739 06/27/12 2219 06/27/12 1645 06/27/12 1140  GLUCAP 157* 140* 138* 122* 156*    Iron Studies: No results found for this basename: IRON:30,TIBC:30,TRANSFERRIN:30,FERRITIN:30 in the last 168 hours  Physical Exam:  Blood pressure 157/69, pulse 71, temperature 98.3 F (36.8 C), temperature source Oral, resp. rate 16, height 5\' 10"  (1.778 m), weight 144.1 kg (317 lb 10.9 oz), SpO2  100.00%.  Gen: obese, pleasant WM in no distress, lying 45% in bed  Skin: no rash, cyanosis  HEENT: EOMI, sclera anicteric, throat clear and moist  Neck: no JVD, no LAN  Chest: crackles R base otherwise cleaar  CV: regular, no rub or gallop, 2-3/6 murmur over apex, no carotid or femoral bruits, pedal pulses strong bilat  Abdomen: soft, obese, nontender, no ascites or HSM  Ext: no LE or UE edema, mild erythema L leg at lower leg, no joint effusion or deformity, no gangrene or ulceration  Neuro: alert, Ox3, no focal deficit, no asterixis   UA 12/16- no prot, 0-5 rbc, no wbc  Date Creat  12/16 1.18  12/17 0.87  12/18 0.74  12/19 -  12/20 5.65  12/21 6.15   Impression/Rec  1. Acute renal failure, likely ATN due to vanc toxicity (level 77) w ARB Rx and NSAID'S exacerbating. Sediment reviewed 12/21 > bland, no casts or rbc's, no sig proteinuria. Vanc stopped, will get cubicin most likely per ID recommendations. Creat rising, non-oliguric, no uremic symptoms. Continue to observe daily, cont IVF"s at 75/hr as long as UOP is good, no need for HD yet. 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 LLE cellulitis- improved 4. HTN 5. Morbid obesity  6. DM2  Vinson Moselle  MD [facility] [phone] pgr    [phone] cell 06/28/2012, 2:16 PM

## 2012-06-28 NOTE — Progress Notes (Addendum)
Subjective:   61 y/o obese male with DM2. Recent LLE cellulitis. Admitted to [facility] with septic shock. Found to have large MV vegetation. Now with acute renal failure.   Denies CP or SOB. No fevers or chills. No HF.   Seen by Renal yesterday (thanks). Started on IVF. Making good urine. BMET pending for this am. Vanc level 45   Intake/Output Summary (Last 24 hours) at 06/28/12 0938 Last data filed at 06/28/12 0900  Gross per 24 hour  Intake 1696.25 ml  Output   2225 ml  Net -528.75 ml    Current meds:    . aspirin EC  81 mg Oral Daily  . gabapentin  100 mg Oral TID  . glipiZIDE  10 mg Oral QAC breakfast  . heparin  5,000 Units Subcutaneous Q8H  . insulin aspart  0-15 Units Subcutaneous TID WC  . insulin glargine  20 Units Subcutaneous QHS  . multivitamin with minerals  1 tablet Oral Daily  . omega-3 acid ethyl esters  1 g Oral Daily  . sertraline  100 mg Oral Daily  . sodium chloride  10 mL Intravenous Q12H  . sodium chloride  3 mL Intravenous Q12H   Infusions:    . sodium chloride 75 mL/hr at 06/27/12 1900     Objective:  Blood pressure 127/28, pulse 77, temperature 98 F (36.7 C), temperature source Oral, resp. rate 16, height 5\' 10"  (1.778 m), weight 144.1 kg (317 lb 10.9 oz), SpO2 98.00%. Weight change: 1.2 kg (2 lb 10.3 oz)  Physical Exam: General: Pleasant, NAD  Psych: Normal affect.  Neuro: Alert and oriented X 3. Moves all extremities spontaneously.  HEENT: Normal with exception of poor dentition.  Neck: Supple, obese, without bruits. Difficult to assess JVP. + central line Lungs: Resp regular and unlabored, CTA.  Heart: RRR no s3, s4, 2/6 syst murmur heard best @ [facility]. No MR heard Abdomen: Soft, non-tender, protuberant, BS + x 4.  Extremities: No clubbing, cyanosis. Left lower leg with tr edema and no erythema. Trace edema on the right. DP/PT/Radials 2+ and equal bilaterally. No splinter, janeway or Osler's lesions   Telemetry:  SR with LBBB in  80s. No svt  Lab Results: Basic Metabolic Panel:  Lab 06/27/12 1610 06/26/12 2219  NA 131* 129*  K 3.9 3.9  CL 97 95*  CO2 20 19  GLUCOSE 147* 187*  BUN 46* 41*  CREATININE 6.15* 5.65*  CALCIUM 8.6 8.7  MG -- --  PHOS -- --   Liver Function Tests:  Lab 06/26/12 2219  AST 24  ALT 31  ALKPHOS 70  BILITOT 0.4  PROT 6.4  ALBUMIN 3.0*   No results found for this basename: LIPASE:5,AMYLASE:5 in the last 168 hours No results found for this basename: AMMONIA:5 in the last 168 hours CBC:  Lab 06/27/12 0437 06/26/12 2219  WBC 6.8 7.6  NEUTROABS -- --  HGB 12.3* 12.8*  HCT 35.9* 38.1*  MCV 92.3 92.0  PLT 136* 133*   Cardiac Enzymes: No results found for this basename: CKTOTAL:5,CKMB:5,CKMBINDEX:5,TROPONINI:5 in the last 168 hours BNP: No components found with this basename: POCBNP:5 CBG:  Lab 06/28/12 0739 06/27/12 2219 06/27/12 1645 06/27/12 1140 06/27/12 0807  GLUCAP 140* 138* 122* 156* 141*   Microbiology: No results found for this basename: cult   No results found for this basename: CULT:2,SDES:2 in the last 168 hours  Imaging: Dg Orthopantogram  06/27/2012  *RADIOLOGY REPORT*  Clinical Data: Preoperative evaluation in patient with endocarditis and  dental disease.  ORTHOPANTOGRAM/PANORAMIC  Comparison: None.  Findings: Multiple dental caries involving what I believe are teeth numbers 2, 3, 7, 15, and likely 18.  The central incisors were suboptimally imaged.  No periapical lucencies involving any of the teeth to suggest an abscess.  IMPRESSION: Multiple dental caries.   Original Report Authenticated By: Hulan Saas, M.D.    US Renal  06/27/2012  *RADIOLOGY REPORT*  Clinical Data: Acute renal failure, diabetes, obesity  RENAL/URINARY TRACT ULTRASOUND COMPLETE  Comparison:  12/07/2010  Findings:  Right Kidney:  14.5 cm. No hydronephrosis or focal mass.  Left Kidney:  15.2 cm.  Not optimally visualized due to body habitus but no focal abnormality or  hydronephrosis identified.  Bladder:  Normal.  IMPRESSION: Normal exam.   Original Report Authenticated By: Christiana Pellant, M.D.      ASSESSMENT:  1. Endocarditis with large MV vegetation (culture negative to date though did receive vanc prior to [facility])    --EF 45-50% mod-severe LVH. Normal RV. No MR mentioned. 2. Septic shock - improving 3. Morbid obesity 4. DM2  5. LLE cellulitis 6. SVT  7. Poor dentition - dental consult pending 8. Acute renal failure 9. LBBB  PLAN/DISCUSSION:  Hemodynamically stable. BP much improved. Appreciate Renal and ID recs. CVTS to see Monday. No HF on exam.  Continue supportive care. Watch closely for volume overload. ID to decide on abx regimen. Vanc level is supratherapeutic for now.  Gabapentin and glipizide decreased in setting of renal failure.   LOS: 2 days    Arvilla Meres, MD 06/28/2012, 9:38 AM

## 2012-06-29 ENCOUNTER — Encounter (HOSPITAL_COMMUNITY): Payer: Self-pay | Admitting: Thoracic Surgery (Cardiothoracic Vascular Surgery)

## 2012-06-29 DIAGNOSIS — I38 Endocarditis, valve unspecified: Secondary | ICD-10-CM

## 2012-06-29 LAB — BASIC METABOLIC PANEL
BUN: 65 mg/dL — ABNORMAL HIGH (ref 6–23)
CO2: 18 mEq/L — ABNORMAL LOW (ref 19–32)
Calcium: 8.6 mg/dL (ref 8.4–10.5)
Chloride: 104 mEq/L (ref 96–112)
Creatinine, Ser: 8.09 mg/dL — ABNORMAL HIGH (ref 0.50–1.35)
GFR calc Af Amer: 7 mL/min — ABNORMAL LOW (ref >90)
GFR calc non Af Amer: 6 mL/min — ABNORMAL LOW (ref >90)
Glucose, Bld: 147 mg/dL — ABNORMAL HIGH (ref 70–99)
Potassium: 4.6 mEq/L (ref 3.5–5.1)
Sodium: 137 mEq/L (ref 135–145)

## 2012-06-29 LAB — CBC WITH DIFFERENTIAL/PLATELET
Basophils Absolute: 0 10*3/uL (ref 0.0–0.1)
Basophils Relative: 0 % (ref 0–1)
Eosinophils Absolute: 0.1 10*3/uL (ref 0.0–0.7)
Eosinophils Relative: 2 % (ref 0–5)
HCT: 35 % — ABNORMAL LOW (ref 39.0–52.0)
Hemoglobin: 11.9 g/dL — ABNORMAL LOW (ref 13.0–17.0)
Lymphocytes Relative: 10 % — ABNORMAL LOW (ref 12–46)
Lymphs Abs: 0.7 10*3/uL (ref 0.7–4.0)
MCH: 31.6 pg (ref 26.0–34.0)
MCHC: 34 g/dL (ref 30.0–36.0)
MCV: 92.8 fL (ref 78.0–100.0)
Monocytes Absolute: 0.7 10*3/uL (ref 0.1–1.0)
Monocytes Relative: 9 % (ref 3–12)
Neutro Abs: 5.5 10*3/uL (ref 1.7–7.7)
Neutrophils Relative %: 79 % — ABNORMAL HIGH (ref 43–77)
Platelets: 169 10*3/uL (ref 150–400)
RBC: 3.77 MIL/uL — ABNORMAL LOW (ref 4.22–5.81)
RDW: 13.6 % (ref 11.5–15.5)
WBC: 7 10*3/uL (ref 4.0–10.5)

## 2012-06-29 LAB — C4 COMPLEMENT: Complement C4, Body Fluid: 27 mg/dL (ref 10–40)

## 2012-06-29 LAB — GLUCOSE, CAPILLARY
Glucose-Capillary: 159 mg/dL — ABNORMAL HIGH (ref 70–99)
Glucose-Capillary: 113 mg/dL — ABNORMAL HIGH (ref 70–99)
Glucose-Capillary: 126 mg/dL — ABNORMAL HIGH (ref 70–99)
Glucose-Capillary: 164 mg/dL — ABNORMAL HIGH (ref 70–99)
Glucose-Capillary: 162 mg/dL — ABNORMAL HIGH (ref 70–99)

## 2012-06-29 LAB — C3 COMPLEMENT: C3 Complement: 178 mg/dL (ref 90–180)

## 2012-06-29 LAB — CK: Total CK: 24 U/L (ref 7–232)

## 2012-06-29 MED ORDER — SODIUM BICARBONATE 650 MG PO TABS
1300.0000 mg | ORAL_TABLET | Freq: Two times a day (BID) | ORAL | Status: DC
  Administered 2012-06-29 – 2012-07-07 (×18): 1300 mg via ORAL
  Filled 2012-06-29 (×20): qty 2
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
## 2012-06-29 NOTE — Progress Notes (Signed)
Subjective: Interval History: none.  Objective: Vital signs in last 24 hours: Temp:  [98 F (36.7 C)-99.4 F (37.4 C)] 99.4 F (37.4 C) (12/23 0312) Pulse Rate:  [71-97] 92  (12/23 0000) Resp:  [18] 18  (12/23 0312) BP: (126-175)/(28-73) 126/62 mmHg (12/22 2315) SpO2:  [92 %-100 %] 92 % (12/23 0000) Weight:  [143.7 kg (316 lb 12.8 oz)] 143.7 kg (316 lb 12.8 oz) (12/23 0500) Weight change: -0.4 kg (-14.1 oz)  Intake/Output from previous day: 12/22 0701 - 12/23 0700 In: 2217 [P.O.:440; I.V.:1660; IV Piggyback:117] Out: 2150 [Urine:2150] Intake/Output this shift:    General appearance: alert, cooperative and moderately obese Resp: diminished breath sounds bilaterally Cardio: S1, S2 normal and systolic murmur: holosystolic 2/6, blowing at apex GI: obese, pos bs, liver down 6 cm Extremities: edema 1 +  Lab Results:  [facility] 06/27/12 0437 06/26/12 2219  WBC 6.8 7.6  HGB 12.3* 12.8*  HCT 35.9* 38.1*  PLT 136* 133*   BMET:  Basename 06/29/12 0500 06/28/12 1030  NA 137 136  K 4.6 4.6  CL 104 102  CO2 18* 17*  GLUCOSE 147* 162*  BUN 65* 59*  CREATININE 8.09* 7.56*  CALCIUM 8.6 8.9   No results found for this basename: PTH:2 in the last 72 hours Iron Studies: No results found for this basename: IRON,TIBC,TRANSFERRIN,FERRITIN in the last 72 hours  Studies/Results: Dg Orthopantogram  06/27/2012  *RADIOLOGY REPORT*  Clinical Data: Preoperative evaluation in patient with endocarditis and dental disease.  ORTHOPANTOGRAM/PANORAMIC  Comparison: None.  Findings: Multiple dental caries involving what I believe are teeth numbers 2, 3, 7, 15, and likely 18.  The central incisors were suboptimally imaged.  No periapical lucencies involving any of the teeth to suggest an abscess.  IMPRESSION: Multiple dental caries.   Original Report Authenticated By: Hulan Saas, M.D.    US Renal  06/27/2012  *RADIOLOGY REPORT*  Clinical Data: Acute renal failure, diabetes, obesity   RENAL/URINARY TRACT ULTRASOUND COMPLETE  Comparison:  12/07/2010  Findings:  Right Kidney:  14.5 cm. No hydronephrosis or focal mass.  Left Kidney:  15.2 cm.  Not optimally visualized due to body habitus but no focal abnormality or hydronephrosis identified.  Bladder:  Normal.  IMPRESSION: Normal exam.   Original Report Authenticated By: Christiana Pellant, M.D.     I have reviewed the patient's current medications.  Assessment/Plan: 1 AKI from Vanc/NSAID, no hemodynamic changes.  Vol xs mild.  Mild acidemia, not uremic, K ok. Will cont conservative tx as is nonoliguric 2 MV ENDOcarditis on AB 3 Cellulits better 4 Obesity 5 DM stabel  P decrease ivf, Na bicarb, AB , follow vol an sx    LOS: 3 days   Deyani Hegarty L 06/29/2012,7:33 AM

## 2012-06-29 NOTE — Progress Notes (Signed)
[facility]    Subjective: No new complaints   Antibiotics:  Anti-infectives     Start     Dose/Rate Route Frequency Ordered Stop   06/28/12 2000   DAPTOmycin (CUBICIN) 840 mg in sodium chloride 0.9 % IVPB  Status:  Discontinued        6 mg/kg  140 kg (Order-Specific) 233.6 mL/hr over 30 Minutes Intravenous Every 48 hours 06/28/12 1353 06/28/12 1406   06/28/12 2000   DAPTOmycin (CUBICIN) 864.5 mg in sodium chloride 0.9 % IVPB        6 mg/kg  144.1 kg 234.6 mL/hr over 30 Minutes Intravenous Every 48 hours 06/28/12 1406            Medications: Scheduled Meds:   . aspirin EC  81 mg Oral Daily  . DAPTOmycin (CUBICIN)  IV  6 mg/kg Intravenous Q48H  . gabapentin  100 mg Oral TID  . glipiZIDE  10 mg Oral QAC breakfast  . heparin  5,000 Units Subcutaneous Q8H  . insulin aspart  0-15 Units Subcutaneous TID WC  . insulin glargine  20 Units Subcutaneous QHS  . multivitamin with minerals  1 tablet Oral Daily  . omega-3 acid ethyl esters  1 g Oral Daily  . sertraline  100 mg Oral Daily  . sodium bicarbonate  1,300 mg Oral BID  . sodium chloride  10 mL Intravenous Q12H  . sodium chloride  3 mL Intravenous Q12H   Continuous Infusions:   . sodium chloride 50 mL/hr at 06/29/12 0900   PRN Meds:.sodium chloride, acetaminophen, nitroGLYCERIN, ondansetron (ZOFRAN) IV, sodium chloride   Objective: Weight change: -14.1 oz (-0.4 kg)  Intake/Output Summary (Last 24 hours) at 06/29/12 1332 Last data filed at 06/29/12 0700  Gross per 24 hour  Intake   1327 ml  Output   1650 ml  Net   -323 ml   Blood pressure 135/57, pulse 83, temperature 98.7 F (37.1 C), temperature source Oral, resp. rate 18, height 5\' 10"  (1.778 m), weight 316 lb 12.8 oz (143.7 kg), SpO2 97.00%. Temp:  [98 F (36.7 C)-99.4 F (37.4 C)] 98.7 F (37.1 C) (12/23 1135) Pulse Rate:  [77-97] 83  (12/23 1135) Resp:  [18-19] 18  (12/23 1135) BP: (110-175)/(50-73) 135/57 mmHg (12/23  1135) SpO2:  [92 %-99 %] 97 % (12/23 1135) Weight:  [316 lb 12.8 oz (143.7 kg)] 316 lb 12.8 oz (143.7 kg) (12/23 0500)  Physical Exam: General: was asleep initially but easily arousable and then  Alert and awake, oriented x3, not in any acute distress. HEENT: anicteric sclera, pupils reactive to light and accommodation, EOMI CVS regular rate, normal r,  no murmur rubs or gallops Chest: clear to auscultation bilaterally, no wheezing, rales or rhonchi Abdomen: soft nontender, nondistended, normal bowel sounds, Extremities: no  clubbing or edema noted bilaterally Skin: no rashes Lymph: no new lymphadenopathy Neuro: nonfocal  Lab Results:  Basename 06/27/12 0437 06/26/12 2219  WBC 6.8 7.6  HGB 12.3* 12.8*  HCT 35.9* 38.1*  PLT 136* 133*    BMET  Basename 06/29/12 0500 06/28/12 1030  NA 137 136  K 4.6 4.6  CL 104 102  CO2 18* 17*  GLUCOSE 147* 162*  BUN 65* 59*  CREATININE 8.09* 7.56*  CALCIUM 8.6 8.9    Micro Results: Recent Results (from the past 240 hour(s))  MRSA PCR SCREENING     Status: Normal   Collection Time   06/26/12  5:17 PM  Component Value Range Status Comment   MRSA by PCR NEGATIVE  NEGATIVE Final     Studies/Results: US Renal  06/27/2012  *RADIOLOGY REPORT*  Clinical Data: Acute renal failure, diabetes, obesity  RENAL/URINARY TRACT ULTRASOUND COMPLETE  Comparison:  12/07/2010  Findings:  Right Kidney:  14.5 cm. No hydronephrosis or focal mass.  Left Kidney:  15.2 cm.  Not optimally visualized due to body habitus but no focal abnormality or hydronephrosis identified.  Bladder:  Normal.  IMPRESSION: Normal exam.   Original Report Authenticated By: Christiana Pellant, M.D.       Assessment/Plan: IRAM LUNDBERG is a 61 y.o. 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 He had 2 sets of blood cultures done at [facility] (but both AFTER IV vancomycin). Both cultures done on the 16th are NO GROWTH final x 5 days. He has been  transferred to [facility] and has been changed from IV vancomycin to IV daptomycin due to problems with insufficiency. To be evaluated by cardiothoracic surgery today.  #1 mitral valve endocarditis: Source certainly could of been his left lower extremity cellulitis versus his his teeth  --continue IV dapto  --If he has valve replacement would definitely send tissue for culture and PCR for Staph Aureus and Streptococcal species if possible --If he has surgery I would give him 6 weeks of postoperative IV antibiotics --he willl need formal dental evalaution along the way as well given caries seen on orthopanogram    LOS: 3 days   Acey Lav 06/29/2012, 1:32 PM

## 2012-06-29 NOTE — Care Management Note (Addendum)
    Page 1 of 1   07/14/2012     11:19:54 AM   CARE MANAGEMENT NOTE 07/14/2012  Patient:  Cody Hale, Cody Hale   Account Number:  [id number]  Date Initiated:  06/29/2012  Documentation initiated by:  Junius Creamer  Subjective/Objective Assessment:   adm w endocarditis     Action/Plan:   lives w wife   Anticipated DC Date:     Anticipated DC Plan:        DC Planning Services  CM consult      Choice offered to / List presented to:             Status of service:   Medicare Important Message given?   (If response is "NO", the following Medicare IM given date fields will be blank) Date Medicare IM given:   Date Additional Medicare IM given:    Discharge Disposition:    Per UR Regulation:  Reviewed for med. necessity/level of care/duration of stay  If discussed at Long Length of Stay Meetings, dates discussed:   07/07/2012    Comments:  07/14/12 1117a debbie Shawana Knoch rn,bsn spoke w pt's wife. they have no ins and cannot afford zyvox. have printed pt assist form and need md to sign. wife to bring proof of income. will fax and see if pt can get thru drug company. only have limited resources to get meds at disch and prob could not get many days for pt thru hosp match program.   12/23 1251p debbie Izrael Peak rn,bsn [phone]

## 2012-06-29 NOTE — Progress Notes (Signed)
Subjective:  No new complaints today.  No dyspnea on room air. Rhythm stable NSR.  Objective:  Vital Signs in the last 24 hours: Temp:  [98 F (36.7 C)-99.4 F (37.4 C)] 98.8 F (37.1 C) (12/23 0800) Pulse Rate:  [71-97] 77  (12/23 0800) Resp:  [18-19] 19  (12/23 0800) BP: (110-175)/(50-73) 110/50 mmHg (12/23 0800) SpO2:  [92 %-100 %] 94 % (12/23 0800) Weight:  [316 lb 12.8 oz (143.7 kg)] 316 lb 12.8 oz (143.7 kg) (12/23 0500)  Intake/Output from previous day: 12/22 0701 - 12/23 0700 In: 2217 [P.O.:440; I.V.:1660; IV Piggyback:117] Out: 2150 [Urine:2150] Intake/Output from this shift:       . aspirin EC  81 mg Oral Daily  . DAPTOmycin (CUBICIN)  IV  6 mg/kg Intravenous Q48H  . gabapentin  100 mg Oral TID  . glipiZIDE  10 mg Oral QAC breakfast  . heparin  5,000 Units Subcutaneous Q8H  . insulin aspart  0-15 Units Subcutaneous TID WC  . insulin glargine  20 Units Subcutaneous QHS  . multivitamin with minerals  1 tablet Oral Daily  . omega-3 acid ethyl esters  1 g Oral Daily  . sertraline  100 mg Oral Daily  . sodium bicarbonate  1,300 mg Oral BID  . sodium chloride  10 mL Intravenous Q12H  . sodium chloride  3 mL Intravenous Q12H      . sodium chloride 50 mL/hr at 06/29/12 0900    Physical Exam: The patient appears to be in no distress.  Head and neck exam reveals that the pupils are equal and reactive.  The extraocular movements are full.  There is no scleral icterus.  Mouth and pharynx are benign.  No lymphadenopathy.  No carotid bruits.  The jugular venous pressure is normal.  Thyroid is not enlarged or tender.  Chest is clear to percussion and auscultation.  No rales or rhonchi.  Expansion of the chest is symmetrical.  Heart reveals grade 2/6 holosystolic murmur at apex and LSE.  No gallop.  The abdomen is soft and nontender.  Bowel sounds are normoactive.  There is no hepatosplenomegaly or mass.  There are no abdominal bruits.  Extremities: Mild edema  left > right.  Neurologic exam is normal strength and no lateralizing weakness.  No sensory deficits.  Integument reveals no rash  Lab Results:  Basename 06/27/12 0437 06/26/12 2219  WBC 6.8 7.6  HGB 12.3* 12.8*  PLT 136* 133*    Basename 06/29/12 0500 06/28/12 1030  NA 137 136  K 4.6 4.6  CL 104 102  CO2 18* 17*  GLUCOSE 147* 162*  BUN 65* 59*  CREATININE 8.09* 7.56*   No results found for this basename: TROPONINI:2,CK,MB:2 in the last 72 hours Hepatic Function Panel  Basename 06/26/12 2219  PROT 6.4  ALBUMIN 3.0*  AST 24  ALT 31  ALKPHOS 70  BILITOT 0.4  BILIDIR --  IBILI --   No results found for this basename: CHOL in the last 72 hours No results found for this basename: PROTIME in the last 72 hours  Imaging: US Renal  06/27/2012  *RADIOLOGY REPORT*  Clinical Data: Acute renal failure, diabetes, obesity  RENAL/URINARY TRACT ULTRASOUND COMPLETE  Comparison:  12/07/2010  Findings:  Right Kidney:  14.5 cm. No hydronephrosis or focal mass.  Left Kidney:  15.2 cm.  Not optimally visualized due to body habitus but no focal abnormality or hydronephrosis identified.  Bladder:  Normal.  IMPRESSION: Normal exam.   Original Report Authenticated  By: Christiana Pellant, M.D.     Cardiac Studies: Telemetry reviewed shows NSR with LBBB Assessment/Plan:  Patient Active Hospital Problem List: Endocarditis (06/26/2012)   Assessment: Large vegetation on mitral valve   Plan: Antibiotics per ID            Cardiac surgery to see today. Poor dentition (06/26/2012)   Assessment: Multiple dental caries   Plan: Oral surgery to see today. Acute renal failure    Assessment: No clinical evidence of significant volume overload today.    Plan: Renal following. Morbid Obesity    Plan: Dietary counseling before discharge.    LOS: 3 days    Cassell Clement 06/29/2012, 9:14 AM

## 2012-06-30 ENCOUNTER — Inpatient Hospital Stay (HOSPITAL_COMMUNITY): Payer: MEDICAID

## 2012-06-30 ENCOUNTER — Encounter (HOSPITAL_COMMUNITY): Payer: Self-pay

## 2012-06-30 DIAGNOSIS — M79609 Pain in unspecified limb: Secondary | ICD-10-CM

## 2012-06-30 DIAGNOSIS — Z01810 Encounter for preprocedural cardiovascular examination: Secondary | ICD-10-CM

## 2012-06-30 DIAGNOSIS — I38 Endocarditis, valve unspecified: Secondary | ICD-10-CM

## 2012-06-30 LAB — CBC
HCT: 35.3 % — ABNORMAL LOW (ref 39.0–52.0)
Hemoglobin: 11.8 g/dL — ABNORMAL LOW (ref 13.0–17.0)
MCH: 31.1 pg (ref 26.0–34.0)
MCHC: 33.4 g/dL (ref 30.0–36.0)
MCV: 92.9 fL (ref 78.0–100.0)
Platelets: 187 10*3/uL (ref 150–400)
RBC: 3.8 MIL/uL — ABNORMAL LOW (ref 4.22–5.81)
RDW: 13.6 % (ref 11.5–15.5)
WBC: 8.1 10*3/uL (ref 4.0–10.5)

## 2012-06-30 LAB — BLOOD GAS, ARTERIAL
Acid-base deficit: 8.6 mmol/L — ABNORMAL HIGH (ref 0.0–2.0)
Bicarbonate: 16.9 mEq/L — ABNORMAL LOW (ref 20.0–24.0)
Drawn by: 36527
FIO2: 0.21 %
O2 Saturation: 91.5 %
Patient temperature: 98.2
TCO2: 18.1 mmol/L (ref 0–100)
pCO2 arterial: 37.1 mmHg (ref 35.0–45.0)
pH, Arterial: 7.28 — ABNORMAL LOW (ref 7.350–7.450)
pO2, Arterial: 69 mmHg — ABNORMAL LOW (ref 80.0–100.0)

## 2012-06-30 LAB — URINE MICROSCOPIC-ADD ON

## 2012-06-30 LAB — URINALYSIS, ROUTINE W REFLEX MICROSCOPIC
Bilirubin Urine: NEGATIVE
Glucose, UA: NEGATIVE mg/dL
Ketones, ur: NEGATIVE mg/dL
Nitrite: NEGATIVE
Protein, ur: NEGATIVE mg/dL
Specific Gravity, Urine: 1.008 (ref 1.005–1.030)
Urobilinogen, UA: 0.2 mg/dL (ref 0.0–1.0)
pH: 5.5 (ref 5.0–8.0)

## 2012-06-30 LAB — COMPREHENSIVE METABOLIC PANEL
ALT: 24 U/L (ref 0–53)
AST: 19 U/L (ref 0–37)
Albumin: 2.9 g/dL — ABNORMAL LOW (ref 3.5–5.2)
Alkaline Phosphatase: 72 U/L (ref 39–117)
BUN: 73 mg/dL — ABNORMAL HIGH (ref 6–23)
CO2: 18 mEq/L — ABNORMAL LOW (ref 19–32)
Calcium: 8.8 mg/dL (ref 8.4–10.5)
Chloride: 106 mEq/L (ref 96–112)
Creatinine, Ser: 8.36 mg/dL — ABNORMAL HIGH (ref 0.50–1.35)
GFR calc Af Amer: 7 mL/min — ABNORMAL LOW (ref >90)
GFR calc non Af Amer: 6 mL/min — ABNORMAL LOW (ref >90)
Glucose, Bld: 157 mg/dL — ABNORMAL HIGH (ref 70–99)
Potassium: 4.7 mEq/L (ref 3.5–5.1)
Sodium: 139 mEq/L (ref 135–145)
Total Bilirubin: 0.3 mg/dL (ref 0.3–1.2)
Total Protein: 6.7 g/dL (ref 6.0–8.3)

## 2012-06-30 LAB — GLUCOSE, CAPILLARY
Glucose-Capillary: 132 mg/dL — ABNORMAL HIGH (ref 70–99)
Glucose-Capillary: 163 mg/dL — ABNORMAL HIGH (ref 70–99)
Glucose-Capillary: 174 mg/dL — ABNORMAL HIGH (ref 70–99)
Glucose-Capillary: 170 mg/dL — ABNORMAL HIGH (ref 70–99)

## 2012-06-30 LAB — PHOSPHORUS: Phosphorus: 6.1 mg/dL — ABNORMAL HIGH (ref 2.3–4.6)

## 2012-06-30 LAB — HEMOGLOBIN A1C
Hgb A1c MFr Bld: 7.7 % — ABNORMAL HIGH (ref <5.7)
Mean Plasma Glucose: 174 mg/dL — ABNORMAL HIGH (ref <117)

## 2012-06-30 LAB — MAGNESIUM: Magnesium: 2.5 mg/dL (ref 1.5–2.5)

## 2012-06-30 MED ORDER — CHLORHEXIDINE GLUCONATE 0.12 % MT SOLN
15.0000 mL | Freq: Two times a day (BID) | OROMUCOSAL | Status: DC
  Administered 2012-06-30 – 2012-07-15 (×29): 15 mL via OROMUCOSAL
  Filled 2012-06-30 (×33): qty 15

## 2012-06-30 MED ORDER — METOPROLOL TARTRATE 25 MG/10 ML ORAL SUSPENSION
6.2500 mg | Freq: Four times a day (QID) | ORAL | Status: AC
  Administered 2012-06-30 – 2012-07-01 (×5): 6.25 mg via ORAL
  Filled 2012-06-30 (×7): qty 2.5

## 2012-06-30 NOTE — Progress Notes (Addendum)
VASCULAR LAB PRELIMINARY  PRELIMINARY  PRELIMINARY  PRELIMINARY  Pre-op Cardiac Surgery  Carotid Findings:  Right:  Greater than 80% internal carotid artery stenosis.  Left:  No evidence of hemodynamically significant internal carotid artery stenosis.  Bilateral:  Vertebral artery flow is antegrade.     Upper Extremity Right Left  Brachial Pressures 180 Triphasic  164 Triphasic   Radial Waveforms Triphasic  Triphasic   Ulnar Waveforms Triphasic  Triphasic   Palmar Arch (Allen's Test) Within normal limits  Within normal limits    Findings:      Lower  Extremity Right Left  Dorsalis Pedis    Anterior Tibial    Posterior Tibial    Ankle/Brachial Indices      Findings:     Suzanna Zahn, RVT 06/30/2012, 11:26 AM 07/02/2012 1:34 PM

## 2012-06-30 NOTE — Progress Notes (Signed)
Patient Name: Cody Hale Date of Encounter: 06/30/2012  Principal Problem:  *Endocarditis Active Problems:  Recurrent cellulitis of lower leg  Type 2 diabetes mellitus  Poor dentition  SVT (supraventricular tachycardia)  Acute renal insufficiency  Obesity  Normocytic anemia  Thrombocytopenia  History of kidney stones  Depression  Mitral regurgitation  Vegetation of heart valve  Morbid obesity    SUBJECTIVE:  Pt denies chest pain or SOB. Feels pretty well. No edema, eating OK, wife can help with care after D/C.  OBJECTIVE Filed Vitals:   06/30/12 0000 06/30/12 0354 06/30/12 0400 06/30/12 0500  BP: 128/62  122/61   Pulse: 80     Temp:  98.2 F (36.8 C)    TempSrc:  Oral    Resp: 18  18   Height:      Weight:    317 lb 0.3 oz (143.8 kg)  SpO2: 93%       Intake/Output Summary (Last 24 hours) at 06/30/12 1610 Last data filed at 06/30/12 0500  Gross per 24 hour  Intake    625 ml  Output   1200 ml  Net   -575 ml   Filed Weights   06/28/12 0500 06/29/12 0500 06/30/12 0500  Weight: 317 lb 10.9 oz (144.1 kg) 316 lb 12.8 oz (143.7 kg) 317 lb 0.3 oz (143.8 kg)    PHYSICAL EXAM General: Well developed, well nourished, male in no acute distress. Head: Normocephalic, atraumatic.  Neck: Supple without bruits, JVD not elevated. Lungs:  Resp regular and unlabored, few rales bases, no crackles. Heart: RRR, S1, S2, no S3, S4, or murmur. Abdomen: Soft, obese, non-tender, non-distended, BS + x 4.  Extremities: No clubbing, cyanosis, trace edema.  Neuro: Alert and oriented X 3. Moves all extremities spontaneously. Psych: Normal affect.  LABS: CBC: Basename 06/30/12 0455 06/29/12 1639  WBC 8.1 7.0  NEUTROABS -- 5.5  HGB 11.8* 11.9*  HCT 35.3* 35.0*  MCV 92.9 92.8  PLT 187 169   INR:No results found for this basename: INR in the last 72 hours Basic Metabolic Panel: Basename 06/30/12 0455 06/29/12 0500  NA 139 137  K 4.7 4.6  CL 106 104  CO2 18* 18*    GLUCOSE 157* 147*  BUN 73* 65*  CREATININE 8.36* 8.09*  CALCIUM 8.8 8.6  MG -- --  PHOS 6.1* --   Liver Function Tests: [facility] 06/30/12 0455  AST 19  ALT 24  ALKPHOS 72  BILITOT 0.3  PROT 6.7  ALBUMIN 2.9*   Cardiac Enzymes: Basename 06/29/12 0500  CKTOTAL 24  CKMB --  CKMBINDEX --  TROPONINI --   TELE: SR, 13 bt run VT, no symptoms       Radiology/Studies: Dg Orthopantogram 06/27/2012  *RADIOLOGY REPORT*  Clinical Data: Preoperative evaluation in patient with endocarditis and dental disease.  ORTHOPANTOGRAM/PANORAMIC  Comparison: None.  Findings: Multiple dental caries involving what I believe are teeth numbers 2, 3, 7, 15, and likely 18.  The central incisors were suboptimally imaged.  No periapical lucencies involving any of the teeth to suggest an abscess.  IMPRESSION: Multiple dental caries.   Original Report Authenticated By: Hulan Saas, M.D.    US Renal 06/27/2012  *RADIOLOGY REPORT*  Clinical Data: Acute renal failure, diabetes, obesity  RENAL/URINARY TRACT ULTRASOUND COMPLETE  Comparison:  12/07/2010  Findings:  Right Kidney:  14.5 cm. No hydronephrosis or focal mass.  Left Kidney:  15.2 cm.  Not optimally visualized due to body habitus but no focal abnormality or  hydronephrosis identified.  Bladder:  Normal.  IMPRESSION: Normal exam.   Original Report Authenticated By: Christiana Pellant, M.D.     Current Medications:     . aspirin EC  81 mg Oral Daily  . DAPTOmycin (CUBICIN)  IV  6 mg/kg Intravenous Q48H  . gabapentin  100 mg Oral TID  . glipiZIDE  10 mg Oral QAC breakfast  . heparin  5,000 Units Subcutaneous Q8H  . insulin aspart  0-15 Units Subcutaneous TID WC  . insulin glargine  20 Units Subcutaneous QHS  . multivitamin with minerals  1 tablet Oral Daily  . omega-3 acid ethyl esters  1 g Oral Daily  . sertraline  100 mg Oral Daily  . sodium bicarbonate  1,300 mg Oral BID  . sodium chloride  10 mL Intravenous Q12H  . sodium chloride  3 mL Intravenous  Q12H      . sodium chloride 50 mL/hr at 06/30/12 0330    ASSESSMENT AND PLAN: Principal Problem:  *Endocarditis - ID seeing and managing ABX  Mitral regurgitation  Vegetation of heart valve  Active Problems:  Recurrent cellulitis of lower leg - ABX per ID   Type 2 diabetes mellitus - continue Lantus and SSI   Poor dentition - will need dental consult - called Dr Kristin Bruins, the office is closed till July 09, 2012.   SVT (supraventricular tachycardia)/VT - will add very low dose metoprolol 6.25 q 6 hr and follow, increase as BP will tolerate, ck Mg   Acute renal insufficiency/ History of kidney stones - Nephrology following, mgt per their team   Normocytic anemia - stable   Thrombocytopenia - improved   Depression - on home Rx   Morbid obesity - on 1200 calorie diet   Signed, Theodore Demark , PA-C 6:23 AM 06/30/2012 Patient examined and agree except changes made. Will await Dental Consult. Called by Theodore Demark PAC.  Valera Castle, MD 06/30/2012 9:40 AM

## 2012-06-30 NOTE — Progress Notes (Signed)
Subjective: Interval History: has no complaint of no N,V, has good appetite..  Objective: Vital signs in last 24 hours: Temp:  [97.8 F (36.6 C)-98.7 F (37.1 C)] 97.9 F (36.6 C) (12/24 0750) Pulse Rate:  [71-86] 74  (12/24 0751) Resp:  [16-18] 18  (12/24 0750) BP: (120-158)/(55-64) 120/57 mmHg (12/24 0751) SpO2:  [93 %-99 %] 97 % (12/24 0751) Weight:  [143.8 kg (317 lb 0.3 oz)] 143.8 kg (317 lb 0.3 oz) (12/24 0500) Weight change: 0.1 kg (3.5 oz)  Intake/Output from previous day: 12/23 0701 - 12/24 0700 In: 890 [P.O.:240; I.V.:650] Out: 1200 [Urine:1200] Intake/Output this shift: Total I/O In: 50 [I.V.:50] Out: 400 [Urine:400]  General appearance: alert, cooperative, appears stated age and moderately obese Resp: diminished breath sounds bilaterally Cardio: S1, S2 normal and systolic murmur: holosystolic 2/6, blowing at apex GI: obese, pos bs, soft, liver dwon 7 cm Extremities: edema 2+  Lab Results:  [facility] 06/30/12 0455 06/29/12 1639  WBC 8.1 7.0  HGB 11.8* 11.9*  HCT 35.3* 35.0*  PLT 187 169   BMET:  Basename 06/30/12 0455 06/29/12 0500  NA 139 137  K 4.7 4.6  CL 106 104  CO2 18* 18*  GLUCOSE 157* 147*  BUN 73* 65*  CREATININE 8.36* 8.09*  CALCIUM 8.8 8.6   No results found for this basename: PTH:2 in the last 72 hours Iron Studies: No results found for this basename: IRON,TIBC,TRANSFERRIN,FERRITIN in the last 72 hours  Studies/Results: Dg Chest 2 View  06/30/2012  *RADIOLOGY REPORT*  Clinical Data: Shortness of breath.  Rule out CHF.  CHEST - 2 VIEW  Comparison: 06/22/2012  Findings: Central line tip in the upper SVC region.  The lungs are clear without pulmonary edema or airspace disease.  Heart size is upper limits of normal.  Trachea is midline. There are bridging osteophytes or syndesmophytes in the thoracic spine.  IMPRESSION: Stable chest radiograph findings.  No evidence for pulmonary edema.  Central line tip in the upper SVC region.   Original  Report Authenticated By: Richarda Overlie, M.D.     I have reviewed the patient's current medications.  Assessment/Plan: 1 AKI rate of rise slower. And non oliguric.  Not uremia, mild acidemia , on bicarb. Can cont conservative tx. 2 Anemia stabel 3 Endocarditis per ID 4 DM controlled 5 Obesity P. Follow vol , chem, AB    LOS: 4 days   Cody Hale L 06/30/2012,9:00 AM

## 2012-06-30 NOTE — Progress Notes (Signed)
Dr Lurline Idol notified of pt's 13 beat run of Vtach at 0204 hrs, self resolving, asymptomatic.  Will continue to monitor pt.

## 2012-06-30 NOTE — Progress Notes (Signed)
[facility]   Days of daptomycin # 4  Subjective: Co right thigh pain   Antibiotics:  Anti-infectives     Start     Dose/Rate Route Frequency Ordered Stop   06/28/12 2000   DAPTOmycin (CUBICIN) 840 mg in sodium chloride 0.9 % IVPB  Status:  Discontinued        6 mg/kg  140 kg (Order-Specific) 233.6 mL/hr over 30 Minutes Intravenous Every 48 hours 06/28/12 1353 06/28/12 1406   06/28/12 2000   DAPTOmycin (CUBICIN) 864.5 mg in sodium chloride 0.9 % IVPB        6 mg/kg  144.1 kg 234.6 mL/hr over 30 Minutes Intravenous Every 48 hours 06/28/12 1406            Medications: Scheduled Meds:    . aspirin EC  81 mg Oral Daily  . chlorhexidine  15 mL Mouth/Throat BID  . DAPTOmycin (CUBICIN)  IV  6 mg/kg Intravenous Q48H  . gabapentin  100 mg Oral TID  . glipiZIDE  10 mg Oral QAC breakfast  . heparin  5,000 Units Subcutaneous Q8H  . insulin aspart  0-15 Units Subcutaneous TID WC  . insulin glargine  20 Units Subcutaneous QHS  . metoprolol tartrate  6.25 mg Oral Q6H  . multivitamin with minerals  1 tablet Oral Daily  . omega-3 acid ethyl esters  1 g Oral Daily  . sertraline  100 mg Oral Daily  . sodium bicarbonate  1,300 mg Oral BID  . sodium chloride  10 mL Intravenous Q12H  . sodium chloride  3 mL Intravenous Q12H   Continuous Infusions:    . sodium chloride 50 mL/hr at 06/30/12 0330   PRN Meds:.sodium chloride, acetaminophen, nitroGLYCERIN, ondansetron (ZOFRAN) IV, sodium chloride   Objective: Weight change: 3.5 oz (0.1 kg)  Intake/Output Summary (Last 24 hours) at 06/30/12 1135 Last data filed at 06/30/12 1000  Gross per 24 hour  Intake   1040 ml  Output   1900 ml  Net   -860 ml   Blood pressure 120/57, pulse 74, temperature 97.9 F (36.6 C), temperature source Oral, resp. rate 18, height 5\' 10"  (1.778 m), weight 317 lb 0.3 oz (143.8 kg), SpO2 97.00%. Temp:  [97.8 F (36.6 C)-98.4 F (36.9 C)] 97.9 F (36.6 C) (12/24  0750) Pulse Rate:  [71-86] 74  (12/24 0751) Resp:  [16-18] 18  (12/24 0750) BP: (120-158)/(55-64) 120/57 mmHg (12/24 0751) SpO2:  [93 %-99 %] 97 % (12/24 0751) Weight:  [317 lb 0.3 oz (143.8 kg)] 317 lb 0.3 oz (143.8 kg) (12/24 0500)  Physical Exam: General: was asleep initially but easily arousable and then  Alert and awake, oriented x3, not in any acute distress. HEENT: anicteric sclera, pupils reactive to light and accommodation, EOMI CVS regular rate, normal r,  no murmur rubs or gallops Chest: clear to auscultation bilaterally, no wheezing, rales or rhonchi Abdomen: soft nontender, nondistended, normal bowel sounds, Extremities: bilateral edema, Right ? Worse than left, cellulitis resolved on left Skin: no rashesNeuro: nonfocal  Lab Results:  Basename 06/30/12 0455 06/29/12 1639  WBC 8.1 7.0  HGB 11.8* 11.9*  HCT 35.3* 35.0*  PLT 187 169    BMET  Basename 06/30/12 0455 06/29/12 0500  NA 139 137  K 4.7 4.6  CL 106 104  CO2 18* 18*  GLUCOSE 157* 147*  BUN 73* 65*  CREATININE 8.36* 8.09*  CALCIUM 8.8 8.6    Micro Results: Recent Results (from  the past 240 hour(s))  MRSA PCR SCREENING     Status: Normal   Collection Time   06/26/12  5:17 PM      Component Value Range Status Comment   MRSA by PCR NEGATIVE  NEGATIVE Final     Studies/Results: Dg Chest 2 View  06/30/2012  *RADIOLOGY REPORT*  Clinical Data: Shortness of breath.  Rule out CHF.  CHEST - 2 VIEW  Comparison: 06/22/2012  Findings: Central line tip in the upper SVC region.  The lungs are clear without pulmonary edema or airspace disease.  Heart size is upper limits of normal.  Trachea is midline. There are bridging osteophytes or syndesmophytes in the thoracic spine.  IMPRESSION: Stable chest radiograph findings.  No evidence for pulmonary edema.  Central line tip in the upper SVC region.   Original Report Authenticated By: Richarda Overlie, M.D.       Assessment/Plan: Cody Hale is a 61 y.o. 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 He had 2 sets of blood cultures done at [facility] (but both AFTER IV vancomycin). Both cultures done on the 16th are NO GROWTH final x 5 days. He has been transferred to [facility] and has been changed from IV vancomycin to IV daptomycin due to problems with insufficiency. Seen by CVTS surgery, Dr Talha Iser Moras who does not want to proceed with immediate surgery.  #1 mitral valve endocarditis: Source certainly could of been his left lower extremity cellulitis versus his his teeth. I agree with Dr. Johnetta Sloniker Moras that it is an excellent idea to ensure no CNS embolization. I will order MRI without gad for now given renal insuficiency  --continue IV dapto  --If he has valve replacement would definitely send tissue for culture and PCR for Staph Aureus and Streptococcal species if possible --agree with formal dental evalaution along the way as well given caries seen on orthopanogram   #2 LE pain in thigh: dopplers are negative  Dr. Ninetta Lights is available for question the next 4 days.    LOS: 4 days   Cody Hale 06/30/2012, 11:35 AM

## 2012-06-30 NOTE — Progress Notes (Signed)
ANTIBIOTIC CONSULT NOTE - INITIAL  Pharmacy Consult for Daptomycin Indication: Mitral valve endocarditis   No Known Allergies  Patient Measurements: Height: 5\' 10"  (177.8 cm) Weight: 317 lb 0.3 oz (143.8 kg) IBW/kg (Calculated) : 73   Vital Signs: Temp: 97.7 F (36.5 C) (12/24 1203) Temp src: Oral (12/24 1203) BP: 120/57 mmHg (12/24 0751) Pulse Rate: 74  (12/24 0751) Intake/Output from previous day: 12/23 0701 - 12/24 0700 In: 890 [P.O.:240; I.V.:650] Out: 1200 [Urine:1200] Intake/Output from this shift: Total I/O In: 150 [I.V.:150] Out: 700 [Urine:700]  Labs:  [facility] 06/30/12 0455 06/29/12 1639 06/29/12 0500 06/28/12 1030 06/27/12 1433  WBC 8.1 7.0 -- -- --  HGB 11.8* 11.9* -- -- --  PLT 187 169 -- -- --  LABCREA -- -- -- -- 50.72  CREATININE 8.36* -- 8.09* 7.56* --   Estimated Creatinine Clearance: 13.3 ml/min (by C-G formula based on Cr of 8.36).  Basename 06/28/12 0500  VANCOTROUGH --  Leodis Binet --  VANCORANDOM 45.9  GENTTROUGH --  GENTPEAK --  GENTRANDOM --  TOBRATROUGH --  TOBRAPEAK --  TOBRARND --  AMIKACINPEAK --  AMIKACINTROU --  AMIKACIN --     Microbiology: Recent Results (from the past 720 hour(s))  MRSA PCR SCREENING     Status: Normal   Collection Time   06/26/12  5:17 PM      Component Value Range Status Comment   MRSA by PCR NEGATIVE  NEGATIVE Final     Medical History: Past Medical History  Diagnosis Date  . Poor dentition 06/26/2012    Severe dental caries and cavities  . Diabetes mellitus without complication   . Left leg cellulitis   . Obesity   . History of tobacco abuse   . Mitral valve vegetation     a. 06/25/2012 Echo: EF 45-50%, mod-sev conc LVH, nl RV, ? MV veg, Trace TR;  06/26/2012 TEE:  MV vegetation noted.  . Hypertension   . Heart murmur   . Arthritis   . Mitral regurgitation   . Vegetation of heart valve     Large mitral valve vegetation noted on TEE  . Morbid obesity 06/27/2012    Assessment: 61 y.o.  M started on Vanc + Zosyn at [facility] for leg cellulitis then transferred to [facility] on 12/20 after TEE revealed MV vegetation. A Vancomycin trough at [facility] prior to transfer revealed a vancomycin level of 77 mcg/ml. Random levels here at [facility] shown slow elimination (Vanc trough 63.6 on 12/20 @ 2200; Vanc trough 45.9 on 12/22 @ 0500). At the current rate of elimination, it is estimated that it will take ~4-5 days for the patient to reach SUBtherapeutic Vancomycin levels for endocarditis treatment.   Currently receiving daptomycin 864 mg (~6mg /kg) q 48 hrs.  Baseline CK WNL.  Goal of Therapy:  Proper antibiotics for infection/cultures adjusted for renal/hepatic function   Plan:  1. Continue Daptomycin 864 mg (~6 mg/kg) every 48 hours. 2. Continue weekly CK monitoring. 3. Will continue to follow renal function, culture results, and LOT, and antibiotic de-escalation plans  Dareen Piano D 06/30/2012 1:21 PM

## 2012-06-30 NOTE — Progress Notes (Signed)
VASCULAR LAB PRELIMINARY  PRELIMINARY  PRELIMINARY  PRELIMINARY  Right lower extremity venous duplex completed.    Preliminary report:  Right:  No evidence of DVT, superficial thrombosis, or Baker's cyst.  Rehan Holness, RVT 06/30/2012, 11:25 AM

## 2012-07-01 DIAGNOSIS — I498 Other specified cardiac arrhythmias: Secondary | ICD-10-CM

## 2012-07-01 DIAGNOSIS — K089 Disorder of teeth and supporting structures, unspecified: Secondary | ICD-10-CM

## 2012-07-01 LAB — BASIC METABOLIC PANEL
BUN: 77 mg/dL — ABNORMAL HIGH (ref 6–23)
CO2: 19 mEq/L (ref 19–32)
Calcium: 8.9 mg/dL (ref 8.4–10.5)
Chloride: 108 mEq/L (ref 96–112)
Creatinine, Ser: 8.42 mg/dL — ABNORMAL HIGH (ref 0.50–1.35)
GFR calc Af Amer: 7 mL/min — ABNORMAL LOW (ref >90)
GFR calc non Af Amer: 6 mL/min — ABNORMAL LOW (ref >90)
Glucose, Bld: 165 mg/dL — ABNORMAL HIGH (ref 70–99)
Potassium: 5 mEq/L (ref 3.5–5.1)
Sodium: 143 mEq/L (ref 135–145)

## 2012-07-01 LAB — RENAL FUNCTION PANEL
Albumin: 2.7 g/dL — ABNORMAL LOW (ref 3.5–5.2)
BUN: 75 mg/dL — ABNORMAL HIGH (ref 6–23)
CO2: 19 mEq/L (ref 19–32)
Calcium: 8.7 mg/dL (ref 8.4–10.5)
Chloride: 107 mEq/L (ref 96–112)
Creatinine, Ser: 8.3 mg/dL — ABNORMAL HIGH (ref 0.50–1.35)
GFR calc Af Amer: 7 mL/min — ABNORMAL LOW (ref >90)
GFR calc non Af Amer: 6 mL/min — ABNORMAL LOW (ref >90)
Glucose, Bld: 166 mg/dL — ABNORMAL HIGH (ref 70–99)
Phosphorus: 6.3 mg/dL — ABNORMAL HIGH (ref 2.3–4.6)
Potassium: 4.9 mEq/L (ref 3.5–5.1)
Sodium: 140 mEq/L (ref 135–145)

## 2012-07-01 LAB — CBC
HCT: 34.2 % — ABNORMAL LOW (ref 39.0–52.0)
Hemoglobin: 11.4 g/dL — ABNORMAL LOW (ref 13.0–17.0)
MCH: 31.1 pg (ref 26.0–34.0)
MCHC: 33.3 g/dL (ref 30.0–36.0)
MCV: 93.2 fL (ref 78.0–100.0)
Platelets: 213 10*3/uL (ref 150–400)
RBC: 3.67 MIL/uL — ABNORMAL LOW (ref 4.22–5.81)
RDW: 13.5 % (ref 11.5–15.5)
WBC: 7.1 10*3/uL (ref 4.0–10.5)

## 2012-07-01 LAB — GLUCOSE, CAPILLARY
Glucose-Capillary: 133 mg/dL — ABNORMAL HIGH (ref 70–99)
Glucose-Capillary: 138 mg/dL — ABNORMAL HIGH (ref 70–99)
Glucose-Capillary: 144 mg/dL — ABNORMAL HIGH (ref 70–99)
Glucose-Capillary: 204 mg/dL — ABNORMAL HIGH (ref 70–99)

## 2012-07-01 NOTE — Progress Notes (Signed)
Primary cardiologist: Dr. Lewayne Bunting  Subjective:   Patient denies any chest pain or breathlessness at rest. No palpitations. Appetite stable.   Objective:   Temp:  [97.7 F (36.5 C)-98 F (36.7 C)] 97.7 F (36.5 C) (12/25 0335) Pulse Rate:  [66-76] 74  (12/25 0335) Resp:  [18-20] 20  (12/24 2332) BP: (120-157)/(57-65) 123/57 mmHg (12/25 0335) SpO2:  [93 %-97 %] 97 % (12/25 0335) Weight:  [321 lb 14 oz (146 kg)] 321 lb 14 oz (146 kg) (12/25 0452) Last BM Date: 06/30/12  Filed Weights   06/29/12 0500 06/30/12 0500 07/01/12 0452  Weight: 316 lb 12.8 oz (143.7 kg) 317 lb 0.3 oz (143.8 kg) 321 lb 14 oz (146 kg)    Intake/Output Summary (Last 24 hours) at 07/01/12 0532 Last data filed at 07/01/12 0500  Gross per 24 hour  Intake   2627 ml  Output   2300 ml  Net    327 ml   Telemetry: Sinus rhythm.  Exam:  General: Obese, no acute distress.  HEENT: Poor dentition.  Lungs: Clear, nonlabored.  Cardiac: RRR, 2/6 systolic murmur at base.   Lab Results:  Basic Metabolic Panel:  Lab 06/30/12 1610 06/29/12 0500 06/28/12 1030  NA 139 137 136  K 4.7 4.6 4.6  CL 106 104 102  CO2 18* 18* 17*  GLUCOSE 157* 147* 162*  BUN 73* 65* 59*  CREATININE 8.36* 8.09* 7.56*  CALCIUM 8.8 8.6 8.9  MG 2.5 -- --    Liver Function Tests:  Lab 06/30/12 0455 06/26/12 2219  AST 19 24  ALT 24 31  ALKPHOS 72 70  BILITOT 0.3 0.4  PROT 6.7 6.4  ALBUMIN 2.9* 3.0*    CBC:  Lab 06/30/12 0455 06/29/12 1639 06/27/12 0437  WBC 8.1 7.0 6.8  HGB 11.8* 11.9* 12.3*  HCT 35.3* 35.0* 35.9*  MCV 92.9 92.8 92.3  PLT 187 169 136*    Cardiac Enzymes:  Lab 06/29/12 0500  CKTOTAL 24  CKMB --  CKMBINDEX --  TROPONINI --    Head MRI 12/24: IMPRESSION:  Normal MRI of the brain.   Medications:   Scheduled Medications:    . aspirin EC  81 mg Oral Daily  . chlorhexidine  15 mL Mouth/Throat BID  . DAPTOmycin (CUBICIN)  IV  6 mg/kg Intravenous Q48H  . gabapentin  100 mg Oral TID    . glipiZIDE  10 mg Oral QAC breakfast  . heparin  5,000 Units Subcutaneous Q8H  . insulin aspart  0-15 Units Subcutaneous TID WC  . insulin glargine  20 Units Subcutaneous QHS  . metoprolol tartrate  6.25 mg Oral Q6H  . multivitamin with minerals  1 tablet Oral Daily  . omega-3 acid ethyl esters  1 g Oral Daily  . sertraline  100 mg Oral Daily  . sodium bicarbonate  1,300 mg Oral BID  . sodium chloride  10 mL Intravenous Q12H  . sodium chloride  3 mL Intravenous Q12H     Infusions:    . sodium chloride 1,000 mL (07/01/12 0228)     PRN Medications:  sodium chloride, acetaminophen, nitroGLYCERIN, ondansetron (ZOFRAN) IV, sodium chloride   Assessment:   1. 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 Now on daptomycin per ID service. Echocardiograms from [facility] indicate mild mitral regurgitation. Patient has been seen by Dr. Cornelius Moras with TCTS., no surgical plans anticipated at this particular time. Had normal head MRI yesterday arguing against thromboembolic complication.  2. Poor dentition, dental consultation pending.  3. Nonoliguric acute renal failure, managed by Nephrology service at this time.  4. SVT and NSVT, low-dose beta blocker started yesterday.  5. 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 This will need to be taken into account as further management options develop.   Plan/Discussion:    Continue medical treatment for endocarditis, antibiotics per ID service. Otherwise on aspirin, Lopressor with stable rhythm. Dental consultation is pending. Check lipid panel with recent findings of carotid artery disease, will likely need statin as well.   Jonelle Sidle, M.D., F.A.C.C.

## 2012-07-01 NOTE — Progress Notes (Signed)
Subjective: Interval History: none.  Objective: Vital signs in last 24 hours: Temp:  [97.7 F (36.5 C)-98.1 F (36.7 C)] 98.1 F (36.7 C) (12/25 0805) Pulse Rate:  [66-76] 70  (12/25 0805) Resp:  [18-20] 20  (12/24 2332) BP: (123-157)/(57-65) 142/62 mmHg (12/25 0805) SpO2:  [93 %-97 %] 97 % (12/25 0805) Weight:  [146 kg (321 lb 14 oz)] 146 kg (321 lb 14 oz) (12/25 0452) Weight change: 2.2 kg (4 lb 13.6 oz)  Intake/Output from previous day: 12/24 0701 - 12/25 0700 In: 2967 [P.O.:1800; I.V.:1050; IV Piggyback:117] Out: 2300 [Urine:2300] Intake/Output this shift:    General appearance: alert, cooperative and morbidly obese Resp: diminished breath sounds bilaterally and rales bibasilar Cardio: S1, S2 normal and systolic murmur: holosystolic 2/6, blowing at apex GI: obese,pos bs,liver down 6 cm Extremities: edema 3+  Lab Results:  [facility] 07/01/12 0442 06/30/12 0455  WBC 7.1 8.1  HGB 11.4* 11.8*  HCT 34.2* 35.3*  PLT 213 187   BMET:  Basename 07/01/12 0442 06/30/12 0455  NA 143140 139  K 5.04.9 4.7  CL 108107 106  CO2 1919 18*  GLUCOSE 165*166* 157*  BUN 77*75* 73*  CREATININE 8.42*8.30* 8.36*  CALCIUM 8.98.7 8.8   No results found for this basename: PTH:2 in the last 72 hours Iron Studies: No results found for this basename: IRON,TIBC,TRANSFERRIN,FERRITIN in the last 72 hours  Studies/Results: Dg Chest 2 View  06/30/2012  *RADIOLOGY REPORT*  Clinical Data: Shortness of breath.  Rule out CHF.  CHEST - 2 VIEW  Comparison: 06/22/2012  Findings: Central line tip in the upper SVC region.  The lungs are clear without pulmonary edema or airspace disease.  Heart size is upper limits of normal.  Trachea is midline. There are bridging osteophytes or syndesmophytes in the thoracic spine.  IMPRESSION: Stable chest radiograph findings.  No evidence for pulmonary edema.  Central line tip in the upper SVC region.   Original Report Authenticated By: Richarda Overlie, M.D.    Mr  Brain Wo Contrast  06/30/2012  *RADIOLOGY REPORT*  Clinical Data: Renal failure with history of endocarditis.  Rule out septic emboli  MRI HEAD WITHOUT CONTRAST  Technique:  Multiplanar, multiecho pulse sequences of the brain and surrounding structures were obtained according to standard protocol without intravenous contrast.  Comparison: CT head 02/21/2012  Findings: Negative for acute or chronic infarct.  No hemorrhage or mass lesion is present.  There is no edema in the brain.  No evidence of septic emboli.  Cerebral white matter is normal.  Brainstem is normal.  Cerebellum appears normal.  Paranasal sinuses show mild mucosal edema.  Vessels at the base of the brain are patent.  IMPRESSION: Normal MRI of the brain.  Mild chronic sinusitis.   Original Report Authenticated By: [name], M.D.     I have reviewed the patient's current medications.  Assessment/Plan: 1 AKI vol xs.  Acid base better and K ok.  Cr leveling off. Need to limit fluid and follow. Will add diuretics if starts to recover or plateau 2 Anemia stabel 3 Dm controlled 4 Obesity will complic all issues 5 Endocarditis on AB P D/C ivf, cont bicarb. AB    LOS: 5 days   Jericho Cieslik L 07/01/2012,9:02 AM

## 2012-07-02 DIAGNOSIS — L03119 Cellulitis of unspecified part of limb: Secondary | ICD-10-CM

## 2012-07-02 DIAGNOSIS — L02419 Cutaneous abscess of limb, unspecified: Secondary | ICD-10-CM

## 2012-07-02 DIAGNOSIS — Z01810 Encounter for preprocedural cardiovascular examination: Secondary | ICD-10-CM

## 2012-07-02 DIAGNOSIS — I38 Endocarditis, valve unspecified: Secondary | ICD-10-CM

## 2012-07-02 LAB — RENAL FUNCTION PANEL
Albumin: 2.6 g/dL — ABNORMAL LOW (ref 3.5–5.2)
BUN: 82 mg/dL — ABNORMAL HIGH (ref 6–23)
CO2: 20 mEq/L (ref 19–32)
Calcium: 9 mg/dL (ref 8.4–10.5)
Chloride: 105 mEq/L (ref 96–112)
Creatinine, Ser: 8.11 mg/dL — ABNORMAL HIGH (ref 0.50–1.35)
GFR calc Af Amer: 7 mL/min — ABNORMAL LOW (ref >90)
GFR calc non Af Amer: 6 mL/min — ABNORMAL LOW (ref >90)
Glucose, Bld: 129 mg/dL — ABNORMAL HIGH (ref 70–99)
Phosphorus: 6.6 mg/dL — ABNORMAL HIGH (ref 2.3–4.6)
Potassium: 4.9 mEq/L (ref 3.5–5.1)
Sodium: 140 mEq/L (ref 135–145)

## 2012-07-02 LAB — CBC
HCT: 35.5 % — ABNORMAL LOW (ref 39.0–52.0)
Hemoglobin: 11.8 g/dL — ABNORMAL LOW (ref 13.0–17.0)
MCH: 30.9 pg (ref 26.0–34.0)
MCHC: 33.2 g/dL (ref 30.0–36.0)
MCV: 92.9 fL (ref 78.0–100.0)
Platelets: 234 10*3/uL (ref 150–400)
RBC: 3.82 MIL/uL — ABNORMAL LOW (ref 4.22–5.81)
RDW: 13.5 % (ref 11.5–15.5)
WBC: 7.7 10*3/uL (ref 4.0–10.5)

## 2012-07-02 LAB — LIPID PANEL
Cholesterol: 129 mg/dL (ref 0–200)
HDL: 31 mg/dL — ABNORMAL LOW (ref >39)
LDL Cholesterol: 75 mg/dL (ref 0–99)
Total CHOL/HDL Ratio: 4.2 RATIO
Triglycerides: 114 mg/dL (ref <150)
VLDL: 23 mg/dL (ref 0–40)

## 2012-07-02 LAB — GLUCOSE, CAPILLARY
Glucose-Capillary: 135 mg/dL — ABNORMAL HIGH (ref 70–99)
Glucose-Capillary: 159 mg/dL — ABNORMAL HIGH (ref 70–99)
Glucose-Capillary: 170 mg/dL — ABNORMAL HIGH (ref 70–99)
Glucose-Capillary: 165 mg/dL — ABNORMAL HIGH (ref 70–99)

## 2012-07-02 MED ORDER — LINEZOLID 2 MG/ML IV SOLN
600.0000 mg | Freq: Two times a day (BID) | INTRAVENOUS | Status: DC
  Administered 2012-07-02 – 2012-07-12 (×20): 600 mg via INTRAVENOUS
  Filled 2012-07-02 (×24): qty 300

## 2012-07-02 MED ORDER — DIPHENHYDRAMINE HCL 25 MG PO CAPS
50.0000 mg | ORAL_CAPSULE | Freq: Four times a day (QID) | ORAL | Status: DC | PRN
  Administered 2012-07-02: 50 mg via ORAL
  Filled 2012-07-02: qty 2

## 2012-07-02 MED ORDER — SERTRALINE HCL 50 MG PO TABS
50.0000 mg | ORAL_TABLET | Freq: Every day | ORAL | Status: DC
  Administered 2012-07-03 – 2012-07-08 (×6): 50 mg via ORAL
  Filled 2012-07-02 (×6): qty 1

## 2012-07-02 NOTE — Progress Notes (Signed)
Meds consult:  Pt has been zoloft since prior to admission. He will now be on Zyvox. This is a well known interaction with an increase risk for serotonin syndrome. There is actually no evidence for dose reduction off SSRI but some have recommend preemptive reduction in SSRI dose due to this risk. I'll cut the dose in half.  Plan  Change zoloft to 50mg  qday.

## 2012-07-02 NOTE — Progress Notes (Signed)
Subjective: Interval History: none.  Objective: Vital signs in last 24 hours: Temp:  [97.6 F (36.4 C)-98.6 F (37 C)] 97.9 F (36.6 C) (12/26 0757) Pulse Rate:  [70-82] 81  (12/26 0800) Resp:  [20] 20  (12/26 0800) BP: (120-161)/(55-67) 161/63 mmHg (12/26 0800) SpO2:  [96 %-100 %] 100 % (12/26 0757) Weight change:   Intake/Output from previous day: 12/25 0701 - 12/26 0700 In: 770 [P.O.:720; I.V.:50] Out: 3750 [Urine:3750] Intake/Output this shift: Total I/O In: -  Out: 400 [Urine:400]  General appearance: alert, cooperative and morbidly obese Resp: rales bibasilar Cardio: S1, S2 normal and systolic murmur: holosystolic 2/6, blowing at apex GI: obese, pos bs, liver down 6 cm Extremities: edema 3+ Skin: maculopapular rash on back and neck  Lab Results:  [facility] 07/02/12 0616 07/01/12 0442  WBC 7.7 7.1  HGB 11.8* 11.4*  HCT 35.5* 34.2*  PLT 234 213   BMET:  Basename 07/02/12 0500 07/01/12 0442  NA 140 143140  K 4.9 5.04.9  CL 105 108107  CO2 20 1919  GLUCOSE 129* 165*166*  BUN 82* 77*75*  CREATININE 8.11* 8.42*8.30*  CALCIUM 9.0 8.98.7   No results found for this basename: PTH:2 in the last 72 hours Iron Studies: No results found for this basename: IRON,TIBC,TRANSFERRIN,FERRITIN in the last 72 hours  Studies/Results: Mr Brain Wo Contrast  06/30/2012  *RADIOLOGY REPORT*  Clinical Data: Renal failure with history of endocarditis.  Rule out septic emboli  MRI HEAD WITHOUT CONTRAST  Technique:  Multiplanar, multiecho pulse sequences of the brain and surrounding structures were obtained according to standard protocol without intravenous contrast.  Comparison: CT head 02/21/2012  Findings: Negative for acute or chronic infarct.  No hemorrhage or mass lesion is present.  There is no edema in the brain.  No evidence of septic emboli.  Cerebral white matter is normal.  Brainstem is normal.  Cerebellum appears normal.  Paranasal sinuses show mild mucosal edema.   Vessels at the base of the brain are patent.  IMPRESSION: Normal MRI of the brain.  Mild chronic sinusitis.   Original Report Authenticated By: [name], M.D.     I have reviewed the patient's current medications.  Assessment/Plan: 1 AKI Cr plateau and ?trend down. Diusesing spont. Acid/base better with bicarb.  Concern of rash which could be setback 2 Rash ?? Drug, ? Dapto 3 Anemia stable 4 Endocarditis per ID 5 Obesity 6 DM P address rash per primary, follow vol,cont oral bicarb    LOS: 6 days   Laelah Siravo L 07/02/2012,8:18 AM

## 2012-07-02 NOTE — Progress Notes (Signed)
Unable to accurately measure urine output due to pt's inability to void in urinal. Pt incontinent and due to body habitus is unable to visualize urinal. Will place hat in toilet in attempt to catch urine to measure output.

## 2012-07-02 NOTE — Progress Notes (Addendum)
   CARDIOTHORACIC SURGERY PROGRESS NOTE  Subjective: No complaints.  No SOB.  Objective: Vital signs in last 24 hours: Temp:  [97.5 F (36.4 C)-98.6 F (37 C)] 97.5 F (36.4 C) (12/26 1100) Pulse Rate:  [70-82] 70  (12/26 1100) Cardiac Rhythm:  [-] Normal sinus rhythm;Bundle branch block (12/26 1100) Resp:  [20] 20  (12/26 1100) BP: (118-161)/(57-67) 118/59 mmHg (12/26 1100) SpO2:  [96 %-100 %] 97 % (12/26 1100)  Physical Exam:  Rhythm:   sinus  Breath sounds: clear  Heart sounds:  RRR w/out murmur  Abdomen:  soft  Extremities:  warm   Intake/Output from previous day: 12/25 0701 - 12/26 0700 In: 770 [P.O.:720; I.V.:50] Out: 3750 [Urine:3750] Intake/Output this shift: Total I/O In: 975 [P.O.:675; IV Piggyback:300] Out: 400 [Urine:400]  Lab Results:  [facility] 07/02/12 0616 07/01/12 0442  WBC 7.7 7.1  HGB 11.8* 11.4*  HCT 35.5* 34.2*  PLT 234 213   BMET:  Basename 07/02/12 0500 07/01/12 0442  NA 140 143140  K 4.9 5.04.9  CL 105 108107  CO2 20 1919  GLUCOSE 129* 165*166*  BUN 82* 77*75*  CREATININE 8.11* 8.42*8.30*  CALCIUM 9.0 8.98.7    CBG (last 3)   Basename 07/02/12 1146 07/02/12 0757 07/01/12 2146  GLUCAP 159* 135* 204*   PT/INR:  No results found for this basename: LABPROT,INR in the last 72 hours  CXR:  N/A  Assessment/Plan:  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  Because of Cody Hale's acute non-oliguric renal failure I would not favor elective mitral valve repair at this time.  His creatinine appears to have reached a plateau and he remains non-oliguric, but it would appear that it may take a long time for his renal function to recover, and it's unclear to what extent it will recover.  He still needs dental extraction.  He still has a right IJ  central line that was placed at [facility].  Once he has had dental extraction it might be reasonable to consider completing his course of antibiotics as an outpatient if his renal function recovers and he remains stable from a cardiovascular standpoint.  However, I would favor repeating TEE prior to d/c to make certain that no additional complicating features of endocarditis have developed.   I will be out of town next week.  My partners are available as needed if particular questions arise.  OWEN,Cody H 07/02/2012 3:11 PM

## 2012-07-02 NOTE — Progress Notes (Signed)
Dr Myrtis Ser notified of new maculopapular rash on back and neck. Per pt this is new for him. Per Dr Myrtis Ser, RN to notify ID.

## 2012-07-02 NOTE — Progress Notes (Signed)
Patient ID: Cody Hale, male   DOB: 1951-06-24, 61 y.o.   MRN: [id number]   SUBJECTIVE:  Patient is stable during the night. Urine output is good. Creatinine continues to be markedly abnormal.   Filed Vitals:   07/02/12 0000 07/02/12 0034 07/02/12 0100 07/02/12 0400  BP:  122/57 120/58 140/59  Pulse:      Temp: 98 F (36.7 C)   97.6 F (36.4 C)  TempSrc: Oral   Oral  Resp:      Height:      Weight:      SpO2:        Intake/Output Summary (Last 24 hours) at 07/02/12 0756 Last data filed at 07/02/12 0600  Gross per 24 hour  Intake    770 ml  Output   3750 ml  Net  -2980 ml    LABS: Basic Metabolic Panel:  Basename 07/02/12 0500 07/01/12 0442 06/30/12 0455  NA 140 143140 --  K 4.9 5.04.9 --  CL 105 108107 --  CO2 20 1919 --  GLUCOSE 129* 165*166* --  BUN 82* 77*75* --  CREATININE 8.11* 8.42*8.30* --  CALCIUM 9.0 8.98.7 --  MG -- -- 2.5  PHOS 6.6* 6.3* --   Liver Function Tests:  Basename 07/02/12 0500 07/01/12 0442 06/30/12 0455  AST -- -- 19  ALT -- -- 24  ALKPHOS -- -- 72  BILITOT -- -- 0.3  PROT -- -- 6.7  ALBUMIN 2.6* 2.7* --   No results found for this basename: LIPASE:2,AMYLASE:2 in the last 72 hours CBC:  Basename 07/02/12 0616 07/01/12 0442 06/29/12 1639  WBC 7.7 7.1 --  NEUTROABS -- -- 5.5  HGB 11.8* 11.4* --  HCT 35.5* 34.2* --  MCV 92.9 93.2 --  PLT 234 213 --   Cardiac Enzymes: No results found for this basename: CKTOTAL:3,CKMB:3,CKMBINDEX:3,TROPONINI:3 in the last 72 hours BNP: No components found with this basename: POCBNP:3 D-Dimer: No results found for this basename: DDIMER:2 in the last 72 hours Hemoglobin A1C:  Basename 06/30/12 0455  HGBA1C 7.7*   Fasting Lipid Panel:  Basename 07/02/12 0500  CHOL 129  HDL 31*  LDLCALC 75  TRIG 409  CHOLHDL 4.2  LDLDIRECT --   Thyroid Function Tests: No results found for this basename: TSH,T4TOTAL,FREET3,T3FREE,THYROIDAB in the last 72 hours  RADIOLOGY: Dg  Orthopantogram  06/27/2012  *RADIOLOGY REPORT*  Clinical Data: Preoperative evaluation in patient with endocarditis and dental disease.  ORTHOPANTOGRAM/PANORAMIC  Comparison: None.  Findings: Multiple dental caries involving what I believe are teeth numbers 2, 3, 7, 15, and likely 18.  The central incisors were suboptimally imaged.  No periapical lucencies involving any of the teeth to suggest an abscess.  IMPRESSION: Multiple dental caries.   Original Report Authenticated By: Hulan Saas, M.D.    Dg Chest 2 View  06/30/2012  *RADIOLOGY REPORT*  Clinical Data: Shortness of breath.  Rule out CHF.  CHEST - 2 VIEW  Comparison: 06/22/2012  Findings: Central line tip in the upper SVC region.  The lungs are clear without pulmonary edema or airspace disease.  Heart size is upper limits of normal.  Trachea is midline. There are bridging osteophytes or syndesmophytes in the thoracic spine.  IMPRESSION: Stable chest radiograph findings.  No evidence for pulmonary edema.  Central line tip in the upper SVC region.   Original Report Authenticated By: Richarda Overlie, M.D.    Mr Brain Wo Contrast  06/30/2012  *RADIOLOGY REPORT*  Clinical Data: Renal failure with history of endocarditis.  Rule out septic emboli  MRI HEAD WITHOUT CONTRAST  Technique:  Multiplanar, multiecho pulse sequences of the brain and surrounding structures were obtained according to standard protocol without intravenous contrast.  Comparison: CT head 02/21/2012  Findings: Negative for acute or chronic infarct.  No hemorrhage or mass lesion is present.  There is no edema in the brain.  No evidence of septic emboli.  Cerebral white matter is normal.  Brainstem is normal.  Cerebellum appears normal.  Paranasal sinuses show mild mucosal edema.  Vessels at the base of the brain are patent.  IMPRESSION: Normal MRI of the brain.  Mild chronic sinusitis.   Original Report Authenticated By: [name], M.D.    US Renal  06/27/2012  *RADIOLOGY REPORT*   Clinical Data: Acute renal failure, diabetes, obesity  RENAL/URINARY TRACT ULTRASOUND COMPLETE  Comparison:  12/07/2010  Findings:  Right Kidney:  14.5 cm. No hydronephrosis or focal mass.  Left Kidney:  15.2 cm.  Not optimally visualized due to body habitus but no focal abnormality or hydronephrosis identified.  Bladder:  Normal.  IMPRESSION: Normal exam.   Original Report Authenticated By: Christiana Pellant, M.D.     PHYSICAL EXAM Patient is stable. He is markedly overweight. Lungs reveal scattered rhonchi. Cardiac exam reveals distant heart sounds. There is a systolic murmur. The abdomen is protuberant. He has peripheral edema.   TELEMETRY: I have reviewed telemetry today July 02, 2012. There is normal sinus rhythm.   ASSESSMENT AND PLAN:   *Endocarditis    Ongoing treatment by infectious disease.  There's careful ongoing continued input from cardiac surgery.   Recurrent cellulitis of lower leg  Type 2 diabetes mellitus   Poor dentition    Dental consult    SVT (supraventricular tachycardia)     Rhythm is stable   Acute renal insufficiency     Creatinine remains a good urine output is good. Nephrology is managing   Mitral regurgitation    Hemodynamics are stable  [name] 07/02/2012 7:56 AM

## 2012-07-02 NOTE — Progress Notes (Signed)
INFECTIOUS DISEASE PROGRESS NOTE  ID: Cody Hale is a 61 y.o. male with   Principal Problem:  *Endocarditis Active Problems:  Recurrent cellulitis of lower leg  Type 2 diabetes mellitus  Poor dentition  SVT (supraventricular tachycardia)  Acute renal insufficiency  Normocytic anemia  Thrombocytopenia  History of kidney stones  Depression  Mitral regurgitation  Vegetation of heart valve  Morbid obesity  Subjective: Without complaints  Abtx:  Anti-infectives     Start     Dose/Rate Route Frequency Ordered Stop   06/28/12 2000   DAPTOmycin (CUBICIN) 840 mg in sodium chloride 0.9 % IVPB  Status:  Discontinued        6 mg/kg  140 kg (Order-Specific) 233.6 mL/hr over 30 Minutes Intravenous Every 48 hours 06/28/12 1353 06/28/12 1406   06/28/12 2000   DAPTOmycin (CUBICIN) 864.5 mg in sodium chloride 0.9 % IVPB        6 mg/kg  144.1 kg 234.6 mL/hr over 30 Minutes Intravenous Every 48 hours 06/28/12 1406            Medications:  Scheduled:   . aspirin EC  81 mg Oral Daily  . chlorhexidine  15 mL Mouth/Throat BID  . DAPTOmycin (CUBICIN)  IV  6 mg/kg Intravenous Q48H  . gabapentin  100 mg Oral TID  . glipiZIDE  10 mg Oral QAC breakfast  . heparin  5,000 Units Subcutaneous Q8H  . insulin aspart  0-15 Units Subcutaneous TID WC  . insulin glargine  20 Units Subcutaneous QHS  . multivitamin with minerals  1 tablet Oral Daily  . omega-3 acid ethyl esters  1 g Oral Daily  . sertraline  100 mg Oral Daily  . sodium bicarbonate  1,300 mg Oral BID  . sodium chloride  10 mL Intravenous Q12H  . sodium chloride  3 mL Intravenous Q12H    Objective: Vital signs in last 24 hours: Temp:  [97.6 F (36.4 C)-98.6 F (37 C)] 97.9 F (36.6 C) (12/26 0757) Pulse Rate:  [70-82] 81  (12/26 0800) Resp:  [20] 20  (12/26 0800) BP: (120-161)/(55-67) 147/62 mmHg (12/26 0930) SpO2:  [96 %-100 %] 100 % (12/26 0757)   General appearance: alert, cooperative, no distress and morbidly  obese Resp: clear to auscultation bilaterally Cardio: regular rate and rhythm GI: normal findings: bowel sounds normal and soft, non-tender Extremities: edema 2+ Skin: papular - scattered, back  Lab Results  [facility] 07/02/12 0616 07/02/12 0500 07/01/12 0442  WBC 7.7 -- 7.1  HGB 11.8* -- 11.4*  HCT 35.5* -- 34.2*  NA -- 140 143140  K -- 4.9 5.04.9  CL -- 105 108107  CO2 -- 20 1919  BUN -- 82* 77*75*  CREATININE -- 8.11* 8.42*8.30*  GLU -- -- --   Liver Panel  Basename 07/02/12 0500 07/01/12 0442 06/30/12 0455  PROT -- -- 6.7  ALBUMIN 2.6* 2.7* --  AST -- -- 19  ALT -- -- 24  ALKPHOS -- -- 72  BILITOT -- -- 0.3  BILIDIR -- -- --  IBILI -- -- --   Sedimentation Rate No results found for this basename: ESRSEDRATE in the last 72 hours C-Reactive Protein No results found for this basename: CRP:2 in the last 72 hours  Microbiology: Recent Results (from the past 240 hour(s))  MRSA PCR SCREENING     Status: Normal   Collection Time   06/26/12  5:17 PM      Component Value Range Status Comment   MRSA by PCR  NEGATIVE  NEGATIVE Final     Studies/Results: Mr Brain Wo Contrast  06/30/2012  *RADIOLOGY REPORT*  Clinical Data: Renal failure with history of endocarditis.  Rule out septic emboli  MRI HEAD WITHOUT CONTRAST  Technique:  Multiplanar, multiecho pulse sequences of the brain and surrounding structures were obtained according to standard protocol without intravenous contrast.  Comparison: CT head 02/21/2012  Findings: Negative for acute or chronic infarct.  No hemorrhage or mass lesion is present.  There is no edema in the brain.  No evidence of septic emboli.  Cerebral white matter is normal.  Brainstem is normal.  Cerebellum appears normal.  Paranasal sinuses show mild mucosal edema.  Vessels at the base of the brain are patent.  IMPRESSION: Normal MRI of the brain.  Mild chronic sinusitis.   Original Report Authenticated By: [name], M.D.       Assessment/Plan: Rash MV IE     From teeth vs LE cellulitis ARF (? From vanco, hypotension, NSAID)  Total days of antibiotics 11 (vanco --> daptomycin 12-22) Will stop his dapto. Start zyvox. His PLT count is ok for now. Will ask pharm to adjust his SSRI due to risk of serotonin syndrome.  Will need valve PCR if he goes to surgery.  CBC with diff in AM (for Eos)           Johny Sax Infectious Diseases [phone] 07/02/2012, 11:32 AM   LOS: 6 days

## 2012-07-03 DIAGNOSIS — I447 Left bundle-branch block, unspecified: Secondary | ICD-10-CM | POA: Diagnosis present

## 2012-07-03 DIAGNOSIS — R21 Rash and other nonspecific skin eruption: Secondary | ICD-10-CM | POA: Diagnosis not present

## 2012-07-03 LAB — CBC WITH DIFFERENTIAL/PLATELET
Basophils Absolute: 0 10*3/uL (ref 0.0–0.1)
Basophils Relative: 1 % (ref 0–1)
Eosinophils Absolute: 0.3 10*3/uL (ref 0.0–0.7)
Eosinophils Relative: 4 % (ref 0–5)
HCT: 35.2 % — ABNORMAL LOW (ref 39.0–52.0)
Hemoglobin: 11.7 g/dL — ABNORMAL LOW (ref 13.0–17.0)
Lymphocytes Relative: 9 % — ABNORMAL LOW (ref 12–46)
Lymphs Abs: 0.7 10*3/uL (ref 0.7–4.0)
MCH: 31.1 pg (ref 26.0–34.0)
MCHC: 33.2 g/dL (ref 30.0–36.0)
MCV: 93.6 fL (ref 78.0–100.0)
Monocytes Absolute: 0.6 10*3/uL (ref 0.1–1.0)
Monocytes Relative: 8 % (ref 3–12)
Neutro Abs: 5.9 10*3/uL (ref 1.7–7.7)
Neutrophils Relative %: 79 % — ABNORMAL HIGH (ref 43–77)
Platelets: 250 10*3/uL (ref 150–400)
RBC: 3.76 MIL/uL — ABNORMAL LOW (ref 4.22–5.81)
RDW: 13.4 % (ref 11.5–15.5)
WBC: 7.5 10*3/uL (ref 4.0–10.5)

## 2012-07-03 LAB — BASIC METABOLIC PANEL
BUN: 85 mg/dL — ABNORMAL HIGH (ref 6–23)
CO2: 21 mEq/L (ref 19–32)
Calcium: 9 mg/dL (ref 8.4–10.5)
Chloride: 104 mEq/L (ref 96–112)
Creatinine, Ser: 7.83 mg/dL — ABNORMAL HIGH (ref 0.50–1.35)
GFR calc Af Amer: 8 mL/min — ABNORMAL LOW (ref >90)
GFR calc non Af Amer: 7 mL/min — ABNORMAL LOW (ref >90)
Glucose, Bld: 167 mg/dL — ABNORMAL HIGH (ref 70–99)
Potassium: 4.6 mEq/L (ref 3.5–5.1)
Sodium: 140 mEq/L (ref 135–145)

## 2012-07-03 LAB — GLUCOSE, CAPILLARY
Glucose-Capillary: 143 mg/dL — ABNORMAL HIGH (ref 70–99)
Glucose-Capillary: 161 mg/dL — ABNORMAL HIGH (ref 70–99)
Glucose-Capillary: 112 mg/dL — ABNORMAL HIGH (ref 70–99)
Glucose-Capillary: 157 mg/dL — ABNORMAL HIGH (ref 70–99)

## 2012-07-03 NOTE — Progress Notes (Signed)
Patient ID: Cody Hale, male   DOB: 1950/09/08, 61 y.o.   MRN: [id number] S:feels better.  Relieved about improving UOP and kidney function O:BP 139/64  Pulse 82  Temp 97.7 F (36.5 C) (Oral)  Resp 18  Ht 5\' 10"  (1.778 m)  Wt 138.9 kg (306 lb 3.5 oz)  BMI 43.94 kg/m2  SpO2 94%  Intake/Output Summary (Last 24 hours) at 07/03/12 0954 Last data filed at 07/03/12 5621  Gross per 24 hour  Intake   2700 ml  Output   3300 ml  Net   -600 ml   Intake/Output: I/O last 3 completed shifts: In: 2510 [P.O.:1910; IV Piggyback:600] Out: 5475 [Urine:5475]  Intake/Output this shift:  Total I/O In: 780 [P.O.:480; IV Piggyback:300] Out: 325 [Urine:325] Weight change:  Gen:WD obese WM in NAD HYQ:MVHQI HS Resp:decreased BS ONG:EXBMW, +BS, soft Ext:1+pitting edema.   Lab 07/03/12 0450 07/02/12 0500 07/01/12 0442 06/30/12 0455 06/29/12 0500 06/28/12 1030 06/27/12 0437 06/26/12 2219  NA 140 140 143140 139 137 136 131* --  K 4.6 4.9 5.04.9 4.7 4.6 4.6 3.9 --  CL 104 105 108107 106 104 102 97 --  CO2 21 20 1919 18* 18* 17* 20 --  GLUCOSE 167* 129* 165*166* 157* 147* 162* 147* --  BUN 85* 82* 77*75* 73* 65* 59* 46* --  CREATININE 7.83* 8.11* 8.42*8.30* 8.36* 8.09* 7.56* 6.15* --  ALBUMIN -- 2.6* 2.7* 2.9* -- -- -- 3.0*  CALCIUM 9.0 9.0 8.98.7 8.8 8.6 8.9 8.6 --  PHOS -- 6.6* 6.3* 6.1* -- -- -- --  AST -- -- -- 19 -- -- -- 24  ALT -- -- -- 24 -- -- -- 31   Liver Function Tests:  Lab 07/02/12 0500 07/01/12 0442 06/30/12 0455 06/26/12 2219  AST -- -- 19 24  ALT -- -- 24 31  ALKPHOS -- -- 72 70  BILITOT -- -- 0.3 0.4  PROT -- -- 6.7 6.4  ALBUMIN 2.6* 2.7* 2.9* --   No results found for this basename: LIPASE:3,AMYLASE:3 in the last 168 hours No results found for this basename: AMMONIA:3 in the last 168 hours CBC:  Lab 07/03/12 0450 07/02/12 0616 07/01/12 0442 06/30/12 0455 06/29/12 1639  WBC 7.5 7.7 7.1 -- --  NEUTROABS 5.9 -- -- -- 5.5  HGB 11.7* 11.8* 11.4* -- --  HCT  35.2* 35.5* 34.2* -- --  MCV 93.6 92.9 93.2 92.9 92.8  PLT 250 234 213 -- --   Cardiac Enzymes:  Lab 06/29/12 0500  CKTOTAL 24  CKMB --  CKMBINDEX --  TROPONINI --   CBG:  Lab 07/03/12 0802 07/02/12 2151 07/02/12 1624 07/02/12 1146 07/02/12 0757  GLUCAP 143* 165* 170* 159* 135*    Iron Studies: No results found for this basename: IRON,TIBC,TRANSFERRIN,FERRITIN in the last 72 hours Studies/Results: No results found.    Marland Kitchen aspirin EC  81 mg Oral Daily  . chlorhexidine  15 mL Mouth/Throat BID  . gabapentin  100 mg Oral TID  . glipiZIDE  10 mg Oral QAC breakfast  . heparin  5,000 Units Subcutaneous Q8H  . insulin aspart  0-15 Units Subcutaneous TID WC  . insulin glargine  20 Units Subcutaneous QHS  . linezolid  600 mg Intravenous Q12H  . multivitamin with minerals  1 tablet Oral Daily  . omega-3 acid ethyl esters  1 g Oral Daily  . sertraline  50 mg Oral Daily  . sodium bicarbonate  1,300 mg Oral BID  . sodium chloride  10 mL Intravenous  Q12H  . sodium chloride  3 mL Intravenous Q12H    BMET    Component Value Date/Time   NA 140 07/03/2012 0450   K 4.6 07/03/2012 0450   CL 104 07/03/2012 0450   CO2 21 07/03/2012 0450   GLUCOSE 167* 07/03/2012 0450   BUN 85* 07/03/2012 0450   CREATININE 7.83* 07/03/2012 0450   CALCIUM 9.0 07/03/2012 0450   GFRNONAA 7* 07/03/2012 0450   GFRAA 8* 07/03/2012 0450   CBC    Component Value Date/Time   WBC 7.5 07/03/2012 0450   RBC 3.76* 07/03/2012 0450   HGB 11.7* 07/03/2012 0450   HCT 35.2* 07/03/2012 0450   PLT 250 07/03/2012 0450   MCV 93.6 07/03/2012 0450   MCH 31.1 07/03/2012 0450   MCHC 33.2 07/03/2012 0450   RDW 13.4 07/03/2012 0450   LYMPHSABS 0.7 07/03/2012 0450   MONOABS 0.6 07/03/2012 0450   EOSABS 0.3 07/03/2012 0450   BASOSABS 0.0 07/03/2012 0450     Assessment/Plan:  1. ARF- non-oliguric.  Multifactorial- SBE/Vanc toxicity/NSAIDs/acute illness on ARB causing ATN.  Good UOP and Scr is slowly improving.  Cont  to follow UOP and daily Scr.  Cont to hold ACE/ARB and avoid NSAIDs/Cox-II I's and iv contrasted materials.  Renal dose meds   2. 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  ID following as well as CT surgery who would like to hold off on elective MVR until he has Hale dental extraction and renal function improves. 3. DM- per primary team 4. Anemia- follow h/h 5. Obesity 6. Dental caries/poor dentition- await oral surgeon for dental extractions 7. Cellulitis- as above per ID 8. HTN- stable  Cody Hale

## 2012-07-03 NOTE — Progress Notes (Addendum)
Patient ID: Cody Hale, male   DOB: December 11, 1950, 61 y.o.   MRN: [id number]   SUBJECTIVE: The patient has slow continued improvement. They're slow improvement in his renal function. His antibiotics have been adjusted. The rash appears to have stopped spreading. We think it is most likely due to one of the antibiotics.   Filed Vitals:   07/02/12 2318 07/03/12 0350 07/03/12 0500 07/03/12 0800  BP: 164/59 122/57  139/64  Pulse:    82  Temp:  97.8 F (36.6 C)  97.7 F (36.5 C)  TempSrc:  Oral  Oral  Resp: 20   18  Height:      Weight:   306 lb 3.5 oz (138.9 kg)   SpO2:  92%  94%    Intake/Output Summary (Last 24 hours) at 07/03/12 1036 Last data filed at 07/03/12 0903  Gross per 24 hour  Intake   2700 ml  Output   3300 ml  Net   -600 ml    LABS: Basic Metabolic Panel:  Basename 07/03/12 0450 07/02/12 0500 07/01/12 0442  NA 140 140 --  K 4.6 4.9 --  CL 104 105 --  CO2 21 20 --  GLUCOSE 167* 129* --  BUN 85* 82* --  CREATININE 7.83* 8.11* --  CALCIUM 9.0 9.0 --  MG -- -- --  PHOS -- 6.6* 6.3*   Liver Function Tests:  Basename 07/02/12 0500 07/01/12 0442  AST -- --  ALT -- --  ALKPHOS -- --  BILITOT -- --  PROT -- --  ALBUMIN 2.6* 2.7*   No results found for this basename: LIPASE:2,AMYLASE:2 in the last 72 hours CBC:  Basename 07/03/12 0450 07/02/12 0616  WBC 7.5 7.7  NEUTROABS 5.9 --  HGB 11.7* 11.8*  HCT 35.2* 35.5*  MCV 93.6 92.9  PLT 250 234   Cardiac Enzymes: No results found for this basename: CKTOTAL:3,CKMB:3,CKMBINDEX:3,TROPONINI:3 in the last 72 hours BNP: No components found with this basename: POCBNP:3 D-Dimer: No results found for this basename: DDIMER:2 in the last 72 hours Hemoglobin A1C: No results found for this basename: HGBA1C in the last 72 hours Fasting Lipid Panel:  Basename 07/02/12 0500  CHOL 129  HDL 31*  LDLCALC 75  TRIG 409  CHOLHDL 4.2  LDLDIRECT --   Thyroid Function Tests: No results found for this basename:  TSH,T4TOTAL,FREET3,T3FREE,THYROIDAB in the last 72 hours  RADIOLOGY: Dg Orthopantogram  06/27/2012  *RADIOLOGY REPORT*  Clinical Data: Preoperative evaluation in patient with endocarditis and dental disease.  ORTHOPANTOGRAM/PANORAMIC  Comparison: None.  Findings: Multiple dental caries involving what I believe are teeth numbers 2, 3, 7, 15, and likely 18.  The central incisors were suboptimally imaged.  No periapical lucencies involving any of the teeth to suggest an abscess.  IMPRESSION: Multiple dental caries.   Original Report Authenticated By: Hulan Saas, M.D.    Dg Chest 2 View  06/30/2012  *RADIOLOGY REPORT*  Clinical Data: Shortness of breath.  Rule out CHF.  CHEST - 2 VIEW  Comparison: 06/22/2012  Findings: Central line tip in the upper SVC region.  The lungs are clear without pulmonary edema or airspace disease.  Heart size is upper limits of normal.  Trachea is midline. There are bridging osteophytes or syndesmophytes in the thoracic spine.  IMPRESSION: Stable chest radiograph findings.  No evidence for pulmonary edema.  Central line tip in the upper SVC region.   Original Report Authenticated By: Richarda Overlie, M.D.    Mr Brain Wo Contrast  06/30/2012  *  RADIOLOGY REPORT*  Clinical Data: Renal failure with history of endocarditis.  Rule out septic emboli  MRI HEAD WITHOUT CONTRAST  Technique:  Multiplanar, multiecho pulse sequences of the brain and surrounding structures were obtained according to standard protocol without intravenous contrast.  Comparison: CT head 02/21/2012  Findings: Negative for acute or chronic infarct.  No hemorrhage or mass lesion is present.  There is no edema in the brain.  No evidence of septic emboli.  Cerebral white matter is normal.  Brainstem is normal.  Cerebellum appears normal.  Paranasal sinuses show mild mucosal edema.  Vessels at the base of the brain are patent.  IMPRESSION: Normal MRI of the brain.  Mild chronic sinusitis.   Original Report Authenticated  By: [name], M.D.    US Renal  06/27/2012  *RADIOLOGY REPORT*  Clinical Data: Acute renal failure, diabetes, obesity  RENAL/URINARY TRACT ULTRASOUND COMPLETE  Comparison:  12/07/2010  Findings:  Right Kidney:  14.5 cm. No hydronephrosis or focal mass.  Left Kidney:  15.2 cm.  Not optimally visualized due to body habitus but no focal abnormality or hydronephrosis identified.  Bladder:  Normal.  IMPRESSION: Normal exam.   Original Report Authenticated By: Christiana Pellant, M.D.     PHYSICAL EXAM   Patient is oriented to person time and place. Affect is normal. Lungs reveal decreased breath sounds. The patient is overweight. There is no respiratory distress. Cardiac exam reveals S1 and S2. His heart sounds are distant. The abdomen is obese. He continues to have peripheral edema. The area of prior cellulitis in his lower extremity has improved. He has a maculopapular rash on his back and upper neck and upper arms.   TELEMETRY:     ASSESSMENT AND PLAN:   *Endocarditis    The treatment is overseen by infectious disease.   Recurrent cellulitis of lower leg     This has improved.   Type 2 diabetes mellitus   Poor dentition    He needs dental evaluation. I will review to see if this is in fact been ordered.(YES)    Acute renal insufficiency    This is being overseen by nephrology. He is having slow improvement   Normocytic anemia  Thrombocytopenia  History of kidney stones  Depression   Mitral regurgitation  Vegetation of heart valve      At this point his hemodynamics are stable. There has been careful notes from the heart surgery team concerning following the patient until he is completely improved medically including his teeth, renal function etc.   Rash    We think his rash is from his antibiotic. The antibiotics Have been changed.   Left bundle-branch block    Initial EKG revealed left bundle branch block. I will repeat the EKG so that there is a second tracing and we can get  exact measurements of his PR interval.  It appears that the patient is stable for transfer. It would be optimal for him to be on 2000. I will try to arrange this.  [name] 07/03/2012 10:36 AM

## 2012-07-03 NOTE — Progress Notes (Signed)
Pt continues to have red, raised rash to back, chest and left arm. Rash seems more red and raised after pt completed 2200 dose of Zyvox, pt complaining of rash itching. Dr. Lurline Idol notified new orders received. Will continue to monitor pt.

## 2012-07-03 NOTE — Progress Notes (Signed)
INFECTIOUS DISEASE PROGRESS NOTE  ID: Cody Hale is a 61 y.o. male with   Principal Problem:  *Endocarditis Active Problems:  Recurrent cellulitis of lower leg  Type 2 diabetes mellitus  Poor dentition  SVT (supraventricular tachycardia)  Acute renal insufficiency  Normocytic anemia  Thrombocytopenia  History of kidney stones  Depression  Mitral regurgitation  Vegetation of heart valve  Morbid obesity  Rash  Subjective: C/o R knee pain, r great toe pain  Abtx:  Anti-infectives     Start     Dose/Rate Route Frequency Ordered Stop   07/02/12 1230   linezolid (ZYVOX) IVPB 600 mg        600 mg 300 mL/hr over 60 Minutes Intravenous Every 12 hours 07/02/12 1143     06/28/12 2000   DAPTOmycin (CUBICIN) 840 mg in sodium chloride 0.9 % IVPB  Status:  Discontinued        6 mg/kg  140 kg (Order-Specific) 233.6 mL/hr over 30 Minutes Intravenous Every 48 hours 06/28/12 1353 06/28/12 1406   06/28/12 2000   DAPTOmycin (CUBICIN) 864.5 mg in sodium chloride 0.9 % IVPB  Status:  Discontinued        6 mg/kg  144.1 kg 234.6 mL/hr over 30 Minutes Intravenous Every 48 hours 06/28/12 1406 07/02/12 1143          Medications:  Scheduled:   . aspirin EC  81 mg Oral Daily  . chlorhexidine  15 mL Mouth/Throat BID  . gabapentin  100 mg Oral TID  . glipiZIDE  10 mg Oral QAC breakfast  . heparin  5,000 Units Subcutaneous Q8H  . insulin aspart  0-15 Units Subcutaneous TID WC  . insulin glargine  20 Units Subcutaneous QHS  . linezolid  600 mg Intravenous Q12H  . multivitamin with minerals  1 tablet Oral Daily  . omega-3 acid ethyl esters  1 g Oral Daily  . sertraline  50 mg Oral Daily  . sodium bicarbonate  1,300 mg Oral BID  . sodium chloride  10 mL Intravenous Q12H  . sodium chloride  3 mL Intravenous Q12H    Objective: Vital signs in last 24 hours: Temp:  [97.3 F (36.3 C)-98.3 F (36.8 C)] 97.7 F (36.5 C) (12/27 0800) Pulse Rate:  [70-82] 82  (12/27 0800) Resp:   [18-20] 18  (12/27 0800) BP: (118-164)/(57-64) 139/64 mmHg (12/27 0800) SpO2:  [92 %-97 %] 94 % (12/27 0800) Weight:  [138.9 kg (306 lb 3.5 oz)] 138.9 kg (306 lb 3.5 oz) (12/27 0500)   General appearance: alert, cooperative and no distress Resp: clear to auscultation bilaterally Cardio: regular rate and rhythm GI: normal findings: bowel sounds normal and soft, non-tender Extremities: edema 2+ BLE Skin: macular - back or diffuse on back , more confluent today R knee- no effusion, no increase in heat R great toe- nail somewhat ingrown, onychomycosis  Lab Results  [facility] 07/03/12 0450 07/02/12 0616 07/02/12 0500  WBC 7.5 7.7 --  HGB 11.7* 11.8* --  HCT 35.2* 35.5* --  NA 140 -- 140  K 4.6 -- 4.9  CL 104 -- 105  CO2 21 -- 20  BUN 85* -- 82*  CREATININE 7.83* -- 8.11*  GLU -- -- --   Liver Panel  Basename 07/02/12 0500 07/01/12 0442  PROT -- --  ALBUMIN 2.6* 2.7*  AST -- --  ALT -- --  ALKPHOS -- --  BILITOT -- --  BILIDIR -- --  IBILI -- --   Sedimentation Rate No  results found for this basename: ESRSEDRATE in the last 72 hours C-Reactive Protein No results found for this basename: CRP:2 in the last 72 hours  Microbiology: Recent Results (from the past 240 hour(s))  MRSA PCR SCREENING     Status: Normal   Collection Time   06/26/12  5:17 PM      Component Value Range Status Comment   MRSA by PCR NEGATIVE  NEGATIVE Final     Studies/Results: No results found.   Assessment/Plan: Rash (Eos nl) MV IE (Cx - at [facility], post anbx)  From teeth vs LE cellulitis  ARF (? From vanco, hypotension, NSAID)   Total days of antibiotics 12 (vanco --> daptomycin 12-22--> zyvox 12-26)   His rash is somewhat worse, if from drug/dapto probably still has some in his system.    dental extractions Will need valve PCR if he goes to surgery.    Johny Sax Infectious Diseases [phone] 07/03/2012, 10:39 AM   LOS: 7 days

## 2012-07-04 DIAGNOSIS — R21 Rash and other nonspecific skin eruption: Secondary | ICD-10-CM

## 2012-07-04 DIAGNOSIS — N179 Acute kidney failure, unspecified: Secondary | ICD-10-CM

## 2012-07-04 LAB — BASIC METABOLIC PANEL
BUN: 88 mg/dL — ABNORMAL HIGH (ref 6–23)
CO2: 22 mEq/L (ref 19–32)
Calcium: 9.4 mg/dL (ref 8.4–10.5)
Chloride: 103 mEq/L (ref 96–112)
Creatinine, Ser: 6.86 mg/dL — ABNORMAL HIGH (ref 0.50–1.35)
GFR calc Af Amer: 9 mL/min — ABNORMAL LOW (ref >90)
GFR calc non Af Amer: 8 mL/min — ABNORMAL LOW (ref >90)
Glucose, Bld: 159 mg/dL — ABNORMAL HIGH (ref 70–99)
Potassium: 5.1 mEq/L (ref 3.5–5.1)
Sodium: 140 mEq/L (ref 135–145)

## 2012-07-04 LAB — GLUCOSE, CAPILLARY
Glucose-Capillary: 155 mg/dL — ABNORMAL HIGH (ref 70–99)
Glucose-Capillary: 113 mg/dL — ABNORMAL HIGH (ref 70–99)
Glucose-Capillary: 127 mg/dL — ABNORMAL HIGH (ref 70–99)

## 2012-07-04 MED ORDER — FUROSEMIDE 10 MG/ML IJ SOLN
40.0000 mg | Freq: Once | INTRAMUSCULAR | Status: AC
  Administered 2012-07-04: 40 mg via INTRAVENOUS
  Filled 2012-07-04: qty 4

## 2012-07-04 NOTE — Progress Notes (Signed)
INFECTIOUS DISEASE PROGRESS NOTE  ID: Cody Hale is a 61 y.o. male with   Principal Problem:  *Endocarditis Active Problems:  Recurrent cellulitis of lower leg  Type 2 diabetes mellitus  Poor dentition  SVT (supraventricular tachycardia)  Acute renal insufficiency  Normocytic anemia  Thrombocytopenia  History of kidney stones  Depression  Mitral regurgitation  Vegetation of heart valve  Morbid obesity  Rash  LBBB (left bundle branch block)  Subjective: Without complaints  Abtx:  Anti-infectives     Start     Dose/Rate Route Frequency Ordered Stop   07/02/12 1230   linezolid (ZYVOX) IVPB 600 mg        600 mg 300 mL/hr over 60 Minutes Intravenous Every 12 hours 07/02/12 1143     06/28/12 2000   DAPTOmycin (CUBICIN) 840 mg in sodium chloride 0.9 % IVPB  Status:  Discontinued        6 mg/kg  140 kg (Order-Specific) 233.6 mL/hr over 30 Minutes Intravenous Every 48 hours 06/28/12 1353 06/28/12 1406   06/28/12 2000   DAPTOmycin (CUBICIN) 864.5 mg in sodium chloride 0.9 % IVPB  Status:  Discontinued        6 mg/kg  144.1 kg 234.6 mL/hr over 30 Minutes Intravenous Every 48 hours 06/28/12 1406 07/02/12 1143          Medications:  Scheduled:   . aspirin EC  81 mg Oral Daily  . chlorhexidine  15 mL Mouth/Throat BID  . furosemide  40 mg Intravenous Once  . gabapentin  100 mg Oral TID  . glipiZIDE  10 mg Oral QAC breakfast  . heparin  5,000 Units Subcutaneous Q8H  . insulin aspart  0-15 Units Subcutaneous TID WC  . insulin glargine  20 Units Subcutaneous QHS  . linezolid  600 mg Intravenous Q12H  . multivitamin with minerals  1 tablet Oral Daily  . omega-3 acid ethyl esters  1 g Oral Daily  . sertraline  50 mg Oral Daily  . sodium bicarbonate  1,300 mg Oral BID  . sodium chloride  10 mL Intravenous Q12H  . sodium chloride  3 mL Intravenous Q12H    Objective: Vital signs in last 24 hours: Temp:  [97.7 F (36.5 C)-98.5 F (36.9 C)] 98.5 F (36.9 C)  (12/28 0516) Pulse Rate:  [63-80] 80  (12/28 0516) Resp:  [17-19] 19  (12/28 0516) BP: (116-164)/(57-71) 116/57 mmHg (12/28 0516) SpO2:  [95 %-100 %] 95 % (12/28 0516) Weight:  [137.4 kg (302 lb 14.6 oz)] 137.4 kg (302 lb 14.6 oz) (12/28 0418)   General appearance: alert, cooperative and no distress Resp: clear to auscultation bilaterally Cardio: regular rate and rhythm GI: normal findings: bowel sounds normal and soft, non-tender Skin: rash on his back and UE are improving, less intense.   Lab Results  Basename 07/04/12 0455 07/03/12 0450 07/02/12 0616  WBC -- 7.5 7.7  HGB -- 11.7* 11.8*  HCT -- 35.2* 35.5*  NA 140 140 --  K 5.1 4.6 --  CL 103 104 --  CO2 22 21 --  BUN 88* 85* --  CREATININE 6.86* 7.83* --  GLU -- -- --   Liver Panel  Basename 07/02/12 0500  PROT --  ALBUMIN 2.6*  AST --  ALT --  ALKPHOS --  BILITOT --  BILIDIR --  IBILI --   Sedimentation Rate No results found for this basename: ESRSEDRATE in the last 72 hours C-Reactive Protein No results found for this basename: CRP:2 in  the last 72 hours  Microbiology: Recent Results (from the past 240 hour(s))  MRSA PCR SCREENING     Status: Normal   Collection Time   06/26/12  5:17 PM      Component Value Range Status Comment   MRSA by PCR NEGATIVE  NEGATIVE Final     Studies/Results: No results found.   Assessment/Plan: Rash  MV IE (Cx - at [facility], post anbx)  From teeth vs LE cellulitis  ARF (? From vanco, hypotension, NSAID)  Total days of antibiotics 13 (vanco --> daptomycin 12-22--> zyvox 12-26)  His rash is improved.  Will send serologies for Cx (-) IE work up (fungal, chlamydia, bartonella) Cr improving Will need dental extractions  Will need valve PCR if he goes to surgery.    Cody Hale Infectious Diseases [phone] 07/04/2012, 9:43 AM   LOS: 8 days

## 2012-07-04 NOTE — Progress Notes (Signed)
Patient ID: WILBON OBENCHAIN, male   DOB: 05/12/1951, 61 y.o.   MRN: [id number]   Patient Name: Cody Hale Date of Encounter: 07/04/2012    SUBJECTIVE  No chest pain and shortness of breath is improved. States he is urinating a lot.  CURRENT MEDS    . aspirin EC  81 mg Oral Daily  . chlorhexidine  15 mL Mouth/Throat BID  . gabapentin  100 mg Oral TID  . glipiZIDE  10 mg Oral QAC breakfast  . heparin  5,000 Units Subcutaneous Q8H  . insulin aspart  0-15 Units Subcutaneous TID WC  . insulin glargine  20 Units Subcutaneous QHS  . linezolid  600 mg Intravenous Q12H  . multivitamin with minerals  1 tablet Oral Daily  . omega-3 acid ethyl esters  1 g Oral Daily  . sertraline  50 mg Oral Daily  . sodium bicarbonate  1,300 mg Oral BID  . sodium chloride  10 mL Intravenous Q12H  . sodium chloride  3 mL Intravenous Q12H    OBJECTIVE  Filed Vitals:   07/03/12 1448 07/03/12 2030 07/04/12 0418 07/04/12 0516  BP: 164/71 148/59  116/57  Pulse: 67 70  80  Temp: 97.7 F (36.5 C) 98.3 F (36.8 C)  98.5 F (36.9 C)  TempSrc: Oral Oral  Oral  Resp: 18 18  19   Height:      Weight:   302 lb 14.6 oz (137.4 kg)   SpO2: 98% 100%  95%    Intake/Output Summary (Last 24 hours) at 07/04/12 0820 Last data filed at 07/03/12 2233  Gross per 24 hour  Intake   1120 ml  Output   1200 ml  Net    -80 ml   Filed Weights   07/01/12 0452 07/03/12 0500 07/04/12 0418  Weight: 321 lb 14 oz (146 kg) 306 lb 3.5 oz (138.9 kg) 302 lb 14.6 oz (137.4 kg)    PHYSICAL EXAM  General: Pleasant, NAD. Obese Neuro: Alert and oriented X 3. Moves all extremities spontaneously. Psych: Normal affect. HEENT:  Normal, poor dentition  Neck: Supple without bruits or JVD. Lungs:  Resp regular and unlabored, CTA. Heart: RRR , 3/6 systolic murmur at the apex, no gallop Abdomen: Soft, non-tender, non-distended, BS + x 4.  Extremities: No clubbing, cyanosis, 1+ pitting edema in the pretibial area,. DP/PT/Radials 2+  and equal bilaterally.  Accessory Clinical Findings  CBC  Basename 07/03/12 0450 07/02/12 0616  WBC 7.5 7.7  NEUTROABS 5.9 --  HGB 11.7* 11.8*  HCT 35.2* 35.5*  MCV 93.6 92.9  PLT 250 234   Basic Metabolic Panel  Basename 07/04/12 0455 07/03/12 0450 07/02/12 0500  NA 140 140 --  K 5.1 4.6 --  CL 103 104 --  CO2 22 21 --  GLUCOSE 159* 167* --  BUN 88* 85* --  CREATININE 6.86* 7.83* --  CALCIUM 9.4 9.0 --  MG -- -- --  PHOS -- -- 6.6*   Liver Function Tests  Basename 07/02/12 0500  AST --  ALT --  ALKPHOS --  BILITOT --  PROT --  ALBUMIN 2.6*   No results found for this basename: LIPASE:2,AMYLASE:2 in the last 72 hours Cardiac Enzymes No results found for this basename: CKTOTAL:3,CKMB:3,CKMBINDEX:3,TROPONINI:3 in the last 72 hours BNP No components found with this basename: POCBNP:3 D-Dimer No results found for this basename: DDIMER:2 in the last 72 hours Hemoglobin A1C No results found for this basename: HGBA1C in the last 72 hours Fasting Lipid Panel  Basename 07/02/12 0500  CHOL 129  HDL 31*  LDLCALC 75  TRIG 161  CHOLHDL 4.2  LDLDIRECT --   Thyroid Function Tests No results found for this basename: TSH,T4TOTAL,FREET3,T3FREE,THYROIDAB in the last 72 hours  TELE  Normal sinus rhythm, left bundle branch block  ECG Normal sinus rhythm, left bundle branch block, normal PR interval  Radiology/Studies  Dg Orthopantogram  06/27/2012  *RADIOLOGY REPORT*  Clinical Data: Preoperative evaluation in patient with endocarditis and dental disease.  ORTHOPANTOGRAM/PANORAMIC  Comparison: None.  Findings: Multiple dental caries involving what I believe are teeth numbers 2, 3, 7, 15, and likely 18.  The central incisors were suboptimally imaged.  No periapical lucencies involving any of the teeth to suggest an abscess.  IMPRESSION: Multiple dental caries.   Original Report Authenticated By: Hulan Saas, M.D.    Dg Chest 2 View  06/30/2012  *RADIOLOGY  REPORT*  Clinical Data: Shortness of breath.  Rule out CHF.  CHEST - 2 VIEW  Comparison: 06/22/2012  Findings: Central line tip in the upper SVC region.  The lungs are clear without pulmonary edema or airspace disease.  Heart size is upper limits of normal.  Trachea is midline. There are bridging osteophytes or syndesmophytes in the thoracic spine.  IMPRESSION: Stable chest radiograph findings.  No evidence for pulmonary edema.  Central line tip in the upper SVC region.   Original Report Authenticated By: Richarda Overlie, M.D.    Mr Brain Wo Contrast  06/30/2012  *RADIOLOGY REPORT*  Clinical Data: Renal failure with history of endocarditis.  Rule out septic emboli  MRI HEAD WITHOUT CONTRAST  Technique:  Multiplanar, multiecho pulse sequences of the brain and surrounding structures were obtained according to standard protocol without intravenous contrast.  Comparison: CT head 02/21/2012  Findings: Negative for acute or chronic infarct.  No hemorrhage or mass lesion is present.  There is no edema in the brain.  No evidence of septic emboli.  Cerebral white matter is normal.  Brainstem is normal.  Cerebellum appears normal.  Paranasal sinuses show mild mucosal edema.  Vessels at the base of the brain are patent.  IMPRESSION: Normal MRI of the brain.  Mild chronic sinusitis.   Original Report Authenticated By: [name], M.D.    US Renal  06/27/2012  *RADIOLOGY REPORT*  Clinical Data: Acute renal failure, diabetes, obesity  RENAL/URINARY TRACT ULTRASOUND COMPLETE  Comparison:  12/07/2010  Findings:  Right Kidney:  14.5 cm. No hydronephrosis or focal mass.  Left Kidney:  15.2 cm.  Not optimally visualized due to body habitus but no focal abnormality or hydronephrosis identified.  Bladder:  Normal.  IMPRESSION: Normal exam.   Original Report Authenticated By: Christiana Pellant, M.D.     ASSESSMENT AND PLAN  Principal Problem:  *Endocarditis Active Problems:  Recurrent cellulitis of lower leg  Type 2 diabetes  mellitus  Poor dentition  SVT (supraventricular tachycardia)  Acute renal insufficiency  Normocytic anemia  Thrombocytopenia  History of kidney stones  Depression  Mitral regurgitation  Vegetation of heart valve  Morbid obesity  Rash  LBBB (left bundle branch block)    Slowly improving as is his renal function. To consult will see him on Wednesday. Other management per infectious disease and nephrology.  Signed, Valera Castle MD

## 2012-07-04 NOTE — Progress Notes (Signed)
Patient ID: KILLIAN SCHWER, male   DOB: 1950-09-29, 61 y.o.   MRN: [id number] S:Feels better O:BP 116/57  Pulse 80  Temp 98.5 F (36.9 C) (Oral)  Resp 19  Ht 5\' 10"  (1.778 m)  Wt 137.4 kg (302 lb 14.6 oz)  BMI 43.46 kg/m2  SpO2 95%  Intake/Output Summary (Last 24 hours) at 07/04/12 0939 Last data filed at 07/03/12 2233  Gross per 24 hour  Intake    580 ml  Output    875 ml  Net   -295 ml   Intake/Output: I/O last 3 completed shifts: In: 2380 [P.O.:1680; Other:100; IV Piggyback:600] Out: 4175 [Urine:4175]  Intake/Output this shift:    Weight change: -1.5 kg (-3 lb 4.9 oz) Gen:WD WN obese WM in NAD CVS:no rub Resp:diminished BS at bases WUJ:WJXBJY Ext:1+edema   Lab 07/04/12 0455 07/03/12 0450 07/02/12 0500 07/01/12 0442 06/30/12 0455 06/29/12 0500 06/28/12 1030  NA 140 140 140 143140 139 137 136  K 5.1 4.6 4.9 5.04.9 4.7 4.6 4.6  CL 103 104 105 108107 106 104 102  CO2 22 21 20  1919 18* 18* 17*  GLUCOSE 159* 167* 129* 165*166* 157* 147* 162*  BUN 88* 85* 82* 77*75* 73* 65* 59*  CREATININE 6.86* 7.83* 8.11* 8.42*8.30* 8.36* 8.09* 7.56*  ALBUMIN -- -- 2.6* 2.7* 2.9* -- --  CALCIUM 9.4 9.0 9.0 8.98.7 8.8 8.6 8.9  PHOS -- -- 6.6* 6.3* 6.1* -- --  AST -- -- -- -- 19 -- --  ALT -- -- -- -- 24 -- --   Liver Function Tests:  Lab 07/02/12 0500 07/01/12 0442 06/30/12 0455  AST -- -- 19  ALT -- -- 24  ALKPHOS -- -- 72  BILITOT -- -- 0.3  PROT -- -- 6.7  ALBUMIN 2.6* 2.7* 2.9*   No results found for this basename: LIPASE:3,AMYLASE:3 in the last 168 hours No results found for this basename: AMMONIA:3 in the last 168 hours CBC:  Lab 07/03/12 0450 07/02/12 0616 07/01/12 0442 06/30/12 0455 06/29/12 1639  WBC 7.5 7.7 7.1 -- --  NEUTROABS 5.9 -- -- -- 5.5  HGB 11.7* 11.8* 11.4* -- --  HCT 35.2* 35.5* 34.2* -- --  MCV 93.6 92.9 93.2 92.9 92.8  PLT 250 234 213 -- --   Cardiac Enzymes:  Lab 06/29/12 0500  CKTOTAL 24  CKMB --  CKMBINDEX --  TROPONINI --    CBG:  Lab 07/04/12 0609 07/03/12 2051 07/03/12 1641 07/03/12 1212 07/03/12 0802  GLUCAP 155* 157* 112* 161* 143*    Iron Studies: No results found for this basename: IRON,TIBC,TRANSFERRIN,FERRITIN in the last 72 hours Studies/Results: No results found.    Marland Kitchen aspirin EC  81 mg Oral Daily  . chlorhexidine  15 mL Mouth/Throat BID  . gabapentin  100 mg Oral TID  . glipiZIDE  10 mg Oral QAC breakfast  . heparin  5,000 Units Subcutaneous Q8H  . insulin aspart  0-15 Units Subcutaneous TID WC  . insulin glargine  20 Units Subcutaneous QHS  . linezolid  600 mg Intravenous Q12H  . multivitamin with minerals  1 tablet Oral Daily  . omega-3 acid ethyl esters  1 g Oral Daily  . sertraline  50 mg Oral Daily  . sodium bicarbonate  1,300 mg Oral BID  . sodium chloride  10 mL Intravenous Q12H  . sodium chloride  3 mL Intravenous Q12H    BMET    Component Value Date/Time   NA 140 07/04/2012 0455   K 5.1  07/04/2012 0455   CL 103 07/04/2012 0455   CO2 22 07/04/2012 0455   GLUCOSE 159* 07/04/2012 0455   BUN 88* 07/04/2012 0455   CREATININE 6.86* 07/04/2012 0455   CALCIUM 9.4 07/04/2012 0455   GFRNONAA 8* 07/04/2012 0455   GFRAA 9* 07/04/2012 0455   CBC    Component Value Date/Time   WBC 7.5 07/03/2012 0450   RBC 3.76* 07/03/2012 0450   HGB 11.7* 07/03/2012 0450   HCT 35.2* 07/03/2012 0450   PLT 250 07/03/2012 0450   MCV 93.6 07/03/2012 0450   MCH 31.1 07/03/2012 0450   MCHC 33.2 07/03/2012 0450   RDW 13.4 07/03/2012 0450   LYMPHSABS 0.7 07/03/2012 0450   MONOABS 0.6 07/03/2012 0450   EOSABS 0.3 07/03/2012 0450   BASOSABS 0.0 07/03/2012 0450    Assessment/Plan:  1. ARF- non-oliguric. Multifactorial- SBE/Vanc toxicity/NSAIDs/acute illness on ARB causing ATN. Good UOP and Scr continues to improve. No changes for now.  Cont to follow UOP and daily Scr. Cont to hold ACE/ARB and avoid NSAIDs/Cox-II I's and iv contrasted materials. Renal dose meds  2. 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 ID following as well as CT surgery who would like to hold off on elective MVR until he has a dental extraction and renal function improves. 3. Hyperkalemia- will give dose of lasix and cont with renal diet. 4. DM- per primary team 5. Anemia- follow h/h 6. Obesity 7. Dental caries/poor dentition- await oral surgeon for dental extractions 8. Cellulitis- as above per ID 9. HTN- stable 10. Protein malnutrition- cont with supple  Atlas Kuc A

## 2012-07-05 LAB — BASIC METABOLIC PANEL
BUN: 96 mg/dL — ABNORMAL HIGH (ref 6–23)
CO2: 23 mEq/L (ref 19–32)
Calcium: 9.5 mg/dL (ref 8.4–10.5)
Chloride: 101 mEq/L (ref 96–112)
Creatinine, Ser: 6.04 mg/dL — ABNORMAL HIGH (ref 0.50–1.35)
GFR calc Af Amer: 10 mL/min — ABNORMAL LOW (ref >90)
GFR calc non Af Amer: 9 mL/min — ABNORMAL LOW (ref >90)
Glucose, Bld: 202 mg/dL — ABNORMAL HIGH (ref 70–99)
Potassium: 4.7 mEq/L (ref 3.5–5.1)
Sodium: 140 mEq/L (ref 135–145)

## 2012-07-05 LAB — GLUCOSE, CAPILLARY
Glucose-Capillary: 208 mg/dL — ABNORMAL HIGH (ref 70–99)
Glucose-Capillary: 190 mg/dL — ABNORMAL HIGH (ref 70–99)
Glucose-Capillary: 133 mg/dL — ABNORMAL HIGH (ref 70–99)
Glucose-Capillary: 156 mg/dL — ABNORMAL HIGH (ref 70–99)

## 2012-07-05 MED ORDER — AMLODIPINE BESYLATE 5 MG PO TABS
5.0000 mg | ORAL_TABLET | Freq: Every day | ORAL | Status: DC
  Administered 2012-07-05: 5 mg via ORAL
  Filled 2012-07-05 (×2): qty 1

## 2012-07-05 NOTE — Progress Notes (Signed)
Patient ID: Cody Hale, male   DOB: Jul 28, 1950, 62 y.o.   MRN: [id number] Patient without complaint except some itching from drug rash. He is urinating frequently and is clear. Creatinine continues to drop. He is afebrile. Vital signs are stable. Exam is unchanged.  Dental consult tomorrow. Continue antibiotics per infectious disease. Nephrology managing acute renal failure. No changes today.

## 2012-07-05 NOTE — Progress Notes (Signed)
Patient ID: Cody Hale, male   DOB: [birth year], 61 y.o.   MRN: [id number] S:Feels better O:BP 171/75  Pulse 98  Temp 98.6 F (37 C) (Oral)  Resp 18  Ht 5\' 10"  (1.778 m)  Wt 137.4 kg (302 lb 14.6 oz)  BMI 43.46 kg/m2  SpO2 98%  Intake/Output Summary (Last 24 hours) at 07/05/12 0900 Last data filed at 07/05/12 0438  Gross per 24 hour  Intake    360 ml  Output   2203 ml  Net  -1843 ml   Intake/Output: I/O last 3 completed shifts: In: 820 [P.O.:720; Other:100] Out: 2878 [Urine:2876; Stool:2]  Intake/Output this shift:    Weight change:  Gen:WD obese WM in NAD CVS:no rub Resp:occ rhonchi WUJ:WJXBJY NWG:NFAOZ edema Skin: diffuse erythemetous rash on back and shoulders   Lab 07/05/12 0429 07/04/12 0455 07/03/12 0450 07/02/12 0500 07/01/12 0442 06/30/12 0455 06/29/12 0500  NA 140 140 140 140 143140 139 137  K 4.7 5.1 4.6 4.9 5.04.9 4.7 4.6  CL 101 103 104 105 108107 106 104  CO2 23 22 21 20  1919 18* 18*  GLUCOSE 202* 159* 167* 129* 165*166* 157* 147*  BUN 96* 88* 85* 82* 77*75* 73* 65*  CREATININE 6.04* 6.86* 7.83* 8.11* 8.42*8.30* 8.36* 8.09*  ALBUMIN -- -- -- 2.6* 2.7* 2.9* --  CALCIUM 9.5 9.4 9.0 9.0 8.98.7 8.8 8.6  PHOS -- -- -- 6.6* 6.3* 6.1* --  AST -- -- -- -- -- 19 --  ALT -- -- -- -- -- 24 --   Liver Function Tests:  Lab 07/02/12 0500 07/01/12 0442 06/30/12 0455  AST -- -- 19  ALT -- -- 24  ALKPHOS -- -- 72  BILITOT -- -- 0.3  PROT -- -- 6.7  ALBUMIN 2.6* 2.7* 2.9*   No results found for this basename: LIPASE:3,AMYLASE:3 in the last 168 hours No results found for this basename: AMMONIA:3 in the last 168 hours CBC:  Lab 07/03/12 0450 07/02/12 0616 07/01/12 0442 06/30/12 0455 06/29/12 1639  WBC 7.5 7.7 7.1 -- --  NEUTROABS 5.9 -- -- -- 5.5  HGB 11.7* 11.8* 11.4* -- --  HCT 35.2* 35.5* 34.2* -- --  MCV 93.6 92.9 93.2 92.9 92.8  PLT 250 234 213 -- --   Cardiac Enzymes:  Lab 06/29/12 0500  CKTOTAL 24  CKMB --  CKMBINDEX --  TROPONINI  --   CBG:  Lab 07/05/12 0601 07/04/12 2059 07/04/12 1621 07/04/12 1127 07/04/12 0609  GLUCAP 190* 208* 127* 113* 155*    Iron Studies: No results found for this basename: IRON,TIBC,TRANSFERRIN,FERRITIN in the last 72 hours Studies/Results: No results found.    Marland Kitchen aspirin EC  81 mg Oral Daily  . chlorhexidine  15 mL Mouth/Throat BID  . gabapentin  100 mg Oral TID  . glipiZIDE  10 mg Oral QAC breakfast  . heparin  5,000 Units Subcutaneous Q8H  . insulin aspart  0-15 Units Subcutaneous TID WC  . insulin glargine  20 Units Subcutaneous QHS  . linezolid  600 mg Intravenous Q12H  . multivitamin with minerals  1 tablet Oral Daily  . omega-3 acid ethyl esters  1 g Oral Daily  . sertraline  50 mg Oral Daily  . sodium bicarbonate  1,300 mg Oral BID  . sodium chloride  10 mL Intravenous Q12H  . sodium chloride  3 mL Intravenous Q12H    BMET    Component Value Date/Time   NA 140 07/05/2012 0429   K 4.7 07/05/2012 0429  CL 101 07/05/2012 0429   CO2 23 07/05/2012 0429   GLUCOSE 202* 07/05/2012 0429   BUN 96* 07/05/2012 0429   CREATININE 6.04* 07/05/2012 0429   CALCIUM 9.5 07/05/2012 0429   GFRNONAA 9* 07/05/2012 0429   GFRAA 10* 07/05/2012 0429   CBC    Component Value Date/Time   WBC 7.5 07/03/2012 0450   RBC 3.76* 07/03/2012 0450   HGB 11.7* 07/03/2012 0450   HCT 35.2* 07/03/2012 0450   PLT 250 07/03/2012 0450   MCV 93.6 07/03/2012 0450   MCH 31.1 07/03/2012 0450   MCHC 33.2 07/03/2012 0450   RDW 13.4 07/03/2012 0450   LYMPHSABS 0.7 07/03/2012 0450   MONOABS 0.6 07/03/2012 0450   EOSABS 0.3 07/03/2012 0450   BASOSABS 0.0 07/03/2012 0450     Assessment/Plan:  1. ARF- non-oliguric. Multifactorial- SBE/Vanc toxicity/NSAIDs/acute illness on ARB causing ATN. Good UOP and Scr continues to improve (peak of 8.4 now down to 6).  No changes for now. Cont to follow UOP and daily Scr. Cont to hold ACE/ARB and avoid NSAIDs/Cox-II I's and iv contrasted materials. Renal dose meds    2. 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 ID following as well as CT surgery who would like to hold off on elective MVR until he has a dental extraction and renal function improves. 3. Hyperkalemia- improved with dose of lasix and cont with renal diet. 4. DM- per primary team 5. Anemia- follow h/h 6. Obesity 7. Dental caries/poor dentition- await oral surgeon for dental extractions 8. Cellulitis- as above per ID 9. HTN- poorly controlled.  Will start amlodipine.  Hold off on ARB for now given resolving ARF. 10. Protein malnutrition- cont with supple 11.   Amour Trigg A

## 2012-07-06 LAB — BASIC METABOLIC PANEL
BUN: 88 mg/dL — ABNORMAL HIGH (ref 6–23)
CO2: 25 mEq/L (ref 19–32)
Calcium: 9.5 mg/dL (ref 8.4–10.5)
Chloride: 96 mEq/L (ref 96–112)
Creatinine, Ser: 4.64 mg/dL — ABNORMAL HIGH (ref 0.50–1.35)
GFR calc Af Amer: 14 mL/min — ABNORMAL LOW (ref >90)
GFR calc non Af Amer: 12 mL/min — ABNORMAL LOW (ref >90)
Glucose, Bld: 199 mg/dL — ABNORMAL HIGH (ref 70–99)
Potassium: 4.7 mEq/L (ref 3.5–5.1)
Sodium: 136 mEq/L (ref 135–145)

## 2012-07-06 LAB — GLUCOSE, CAPILLARY
Glucose-Capillary: 227 mg/dL — ABNORMAL HIGH (ref 70–99)
Glucose-Capillary: 193 mg/dL — ABNORMAL HIGH (ref 70–99)
Glucose-Capillary: 182 mg/dL — ABNORMAL HIGH (ref 70–99)
Glucose-Capillary: 209 mg/dL — ABNORMAL HIGH (ref 70–99)
Glucose-Capillary: 154 mg/dL — ABNORMAL HIGH (ref 70–99)

## 2012-07-06 LAB — CBC
HCT: 41.6 % (ref 39.0–52.0)
Hemoglobin: 13.8 g/dL (ref 13.0–17.0)
MCH: 30.9 pg (ref 26.0–34.0)
MCHC: 33.2 g/dL (ref 30.0–36.0)
MCV: 93.1 fL (ref 78.0–100.0)
Platelets: 336 10*3/uL (ref 150–400)
RBC: 4.47 MIL/uL (ref 4.22–5.81)
RDW: 12.9 % (ref 11.5–15.5)
WBC: 9.8 10*3/uL (ref 4.0–10.5)

## 2012-07-06 LAB — CK: Total CK: 26 U/L (ref 7–232)

## 2012-07-06 MED ORDER — AMLODIPINE BESYLATE 10 MG PO TABS
10.0000 mg | ORAL_TABLET | Freq: Every day | ORAL | Status: DC
  Administered 2012-07-06 – 2012-07-09 (×4): 10 mg via ORAL
  Filled 2012-07-06 (×6): qty 1

## 2012-07-06 NOTE — Progress Notes (Signed)
Brief Nutrition Education Note  Reason for Education:  Zyvox food-drug interaction  Patient on prescription of linezolid, a medication with potential interactions with high tyramine-containing foods. Patient was educated on foods high in tyramine. We also briefly discussed a renal/diabetic diet using teach back method. Patient was provided with an educational handout.   Wt Readings from Last 1 Encounters:  07/04/12 302 lb 14.6 oz (137.4 kg)   Body mass index is 43.46 kg/(m^2). Patient is morbidly obese.   Linnell Fulling, RD, LDN Pager #: [phone] After-Hours Pager #: [phone]

## 2012-07-06 NOTE — Progress Notes (Signed)
Subjective: NO CP or SOB Objective: Filed Vitals:   07/05/12 0437 07/05/12 1359 07/05/12 1937 07/06/12 0444  BP: 171/75 162/64 163/69 141/75  Pulse: 98 71 91 84  Temp: 98.6 F (37 C) 97.5 F (36.4 C) 98.2 F (36.8 C) 97.9 F (36.6 C)  TempSrc: Oral Oral Oral Oral  Resp: 18 16 20 20   Height:      Weight:      SpO2: 98% 100% 97% 94%   Weight change:   Intake/Output Summary (Last 24 hours) at 07/06/12 0841 Last data filed at 07/06/12 0730  Gross per 24 hour  Intake    840 ml  Output   1653 ml  Net   -813 ml    General: Alert, awake, oriented x3, in no acute distress Neck:  JVP is normal Heart: Regular rate and rhythm, without murmurs, rubs, gallops.  Lungs: Clear to auscultation.  No rales or wheezes. Exemities:  No edema.  Rash on upper arm (improving per pt's report) Neuro: Grossly intact, nonfocal.  Tele:  SR  1 burst NSVT ( 9 beat) Lab Results: Results for orders placed during the hospital encounter of 06/26/12 (from the past 24 hour(s))  GLUCOSE, CAPILLARY     Status: Abnormal   Collection Time   07/05/12 11:27 AM      Component Value Range   Glucose-Capillary 133 (*) 70 - 99 mg/dL  GLUCOSE, CAPILLARY     Status: Abnormal   Collection Time   07/05/12  4:27 PM      Component Value Range   Glucose-Capillary 156 (*) 70 - 99 mg/dL  GLUCOSE, CAPILLARY     Status: Abnormal   Collection Time   07/05/12  9:03 PM      Component Value Range   Glucose-Capillary 227 (*) 70 - 99 mg/dL  GLUCOSE, CAPILLARY     Status: Abnormal   Collection Time   07/06/12  5:43 AM      Component Value Range   Glucose-Capillary 193 (*) 70 - 99 mg/dL  BASIC METABOLIC PANEL     Status: Abnormal   Collection Time   07/06/12  6:05 AM      Component Value Range   Sodium 136  135 - 145 mEq/L   Potassium 4.7  3.5 - 5.1 mEq/L   Chloride 96  96 - 112 mEq/L   CO2 25  19 - 32 mEq/L   Glucose, Bld 199 (*) 70 - 99 mg/dL   BUN 88 (*) 6 - 23 mg/dL   Creatinine, Ser 1.61 (*) 0.50 - 1.35 mg/dL   Calcium 9.5  8.4 - 09.6 mg/dL   GFR calc non Af Amer 12 (*) >90 mL/min   GFR calc Af Amer 14 (*) >90 mL/min  CK     Status: Normal   Collection Time   07/06/12  6:05 AM      Component Value Range   Total CK 26  7 - 232 U/L    Studies/Results: @RISRSLT24 @  Medications: Revoewed   Patient Active Hospital Problem List: Endocarditis (06/26/2012)   Assessment:  COntinue ABX.   ID following.  CV surgery following Oral surgery to see today.  Further eval (repeat TEE and Cath) to depend on recomm of these services.  Type 2 diabetes mellitus (06/26/2012)   Assessment:  Glu has been 100s to 200s  Poor dentition (06/26/2012)   Assessment: Oral surgery to see.   Arrhythmia  SHort burst NSVT  Follow  Acute renal insufficiency (06/27/2012)   Assessment: Renal  is following   Rash (07/03/2012)   Assessment: Improving  HTN  Will increase amlodipine   LOS: 10 days   Dietrich Pates 07/06/2012, 8:41 AM

## 2012-07-06 NOTE — Progress Notes (Signed)
Inpatient Diabetes Program Recommendations  AACE/ADA: New Consensus Statement on Inpatient Glycemic Control (2013)  Target Ranges:  Prepandial:   less than 140 mg/dL      Peak postprandial:   less than 180 mg/dL (1-2 hours)      Critically ill patients:  140 - 180 mg/dL   Inpatient Diabetes Program Recommendations Correction (SSI): add HS scale for CBGs >200 (per Glycemic Control Order Set) Will follow. Thank you  Piedad Climes [facility] Inpatient Diabetes Coordinator [phone]

## 2012-07-06 NOTE — Progress Notes (Signed)
INFECTIOUS DISEASE PROGRESS NOTE  ID: Cody Hale is a 61 y.o. male with   Principal Problem:  *Endocarditis Active Problems:  Recurrent cellulitis of lower leg  Type 2 diabetes mellitus  Poor dentition  SVT (supraventricular tachycardia)  Acute renal insufficiency  Normocytic anemia  Thrombocytopenia  History of kidney stones  Depression  Mitral regurgitation  Vegetation of heart valve  Morbid obesity  Rash  LBBB (left bundle branch block)  Subjective: Afebrile; rash improving  Day #5 linezolid  Abtx:   Medications:  Scheduled:    . amLODipine  10 mg Oral Daily  . aspirin EC  81 mg Oral Daily  . chlorhexidine  15 mL Mouth/Throat BID  . gabapentin  100 mg Oral TID  . glipiZIDE  10 mg Oral QAC breakfast  . heparin  5,000 Units Subcutaneous Q8H  . insulin aspart  0-15 Units Subcutaneous TID WC  . insulin glargine  20 Units Subcutaneous QHS  . linezolid  600 mg Intravenous Q12H  . multivitamin with minerals  1 tablet Oral Daily  . omega-3 acid ethyl esters  1 g Oral Daily  . sertraline  50 mg Oral Daily  . sodium bicarbonate  1,300 mg Oral BID  . sodium chloride  10 mL Intravenous Q12H  . sodium chloride  3 mL Intravenous Q12H    Objective: Vital signs in last 24 hours: Temp:  [97.5 F (36.4 C)-98.2 F (36.8 C)] 97.9 F (36.6 C) (12/30 0444) Pulse Rate:  [71-91] 84  (12/30 0444) Resp:  [16-20] 20  (12/30 0444) BP: (141-163)/(64-75) 141/75 mmHg (12/30 0444) SpO2:  [94 %-100 %] 94 % (12/30 0444)   General appearance: alert, cooperative and no distress Resp: clear to auscultation bilaterally Cardio: regular rate and rhythm GI: normal findings: bowel sounds normal and soft, non-tender Skin: rash on his back and UE are improving, less intense.   Lab Results  Basename 07/06/12 0605 07/05/12 0429  WBC -- --  HGB -- --  HCT -- --  NA 136 140  K 4.7 4.7  CL 96 101  CO2 25 23  BUN 88* 96*  CREATININE 4.64* 6.04*  GLU -- --     Microbiology: Recent Results (from the past 240 hour(s))  MRSA PCR SCREENING     Status: Normal   Collection Time   06/26/12  5:17 PM      Component Value Range Status Comment   MRSA by PCR NEGATIVE  NEGATIVE Final     Studies/Results: No results found.   Assessment/Plan:61 yo Male with MV endocarditis, culture negative (likely due due to prior antibiotic exposure), source thought to be related to poor dentition vs. LE cellulitis.  MV endocarditis = have sent off for various labs inc. Bartonella, chlamydia, fungal serologies. Currently on linezolid, but will need to change to culture negative coverage. If patient to undergo valve replacement, would appreciate for valve tissue to be sent for culture and PCR testing.  LE cellulitis = continue with linezolid  Drug related Rash = has been off of daptomycin since 12/22, rash is steadily improving  MV IE work up = will that the patient see oral surgeon for dental extraction eval today  ARF = multi factorial possibly due to vanco, hypotension, NSAID.continue with renally dosing of medications.   Risk of serotonin syndrome while on linezolid = if we decide to keep on linezolid will need to discontinue zoloft.  Total days of antibiotics 15 (vanco --> daptomycin 12-22--> zyvox 12-26)  .  Judyann Munson Infectious Diseases [phone] 07/06/2012, 9:47 AM   LOS: 10 days

## 2012-07-06 NOTE — Progress Notes (Addendum)
[facility]  Subjective:  Awake, alert, friendly, wife at bedside   Objective: Vital signs in last 24 hours: Blood pressure 127/72, pulse 84, temperature 97.9 F (36.6 C), temperature source Oral, resp. rate 20, height 5\' 10"  (1.778 m), weight 137.4 kg (302 lb 14.6 oz), SpO2 94.00%.    PHYSICAL EXAM General--as above Chest--clear Heart--systolic murmur Abd--nontender Extr--trace edema  Lab Results:   Lab 07/06/12 0605 07/05/12 0429 07/04/12 0455 07/02/12 0500 07/01/12 0442 06/30/12 0455  NA 136 140 140 -- -- --  K 4.7 4.7 5.1 -- -- --  CL 96 101 103 -- -- --  CO2 25 23 22  -- -- --  BUN 88* 96* 88* -- -- --  CREATININE 4.64* 6.04* 6.86* -- -- --  ALB -- -- -- -- -- --  GLUCOSE 199* -- -- -- -- --  CALCIUM 9.5 9.5 9.4 -- -- --  PHOS -- -- -- 6.6* 6.3* 6.1*     Basename 07/06/12 1058  WBC 9.8  HGB 13.8  HCT 41.6  PLT 336   I have reviewed the patient's current medications.  Assessment/Plan: 1.  Acute renal failure--urine output is ok and Cr continues to decline.   K ok, need to check phos. 2.  ABE mitral valve--per cardiol and TCTS.  On linezolid  3.  Carious non restorable teeth--oral surg to see today 4.  DM-2--per primary service 5.  Anemia--will check Fe studies 6.  High BP--on amlodipine.  BP better today 7.  Nutrition--alb 2.9 on 24 Dec.  On supplements.  Should recheck albumin this week  Will see again on 2 Jan   LOS: 10 days   Cris Talavera F 07/06/2012,12:47 PM   .[email or web address]

## 2012-07-07 LAB — BASIC METABOLIC PANEL WITH GFR
BUN: 81 mg/dL — ABNORMAL HIGH (ref 6–23)
CO2: 26 meq/L (ref 19–32)
Calcium: 9.7 mg/dL (ref 8.4–10.5)
Chloride: 99 meq/L (ref 96–112)
Creatinine, Ser: 3.93 mg/dL — ABNORMAL HIGH (ref 0.50–1.35)
GFR calc Af Amer: 18 mL/min — ABNORMAL LOW
GFR calc non Af Amer: 15 mL/min — ABNORMAL LOW
Glucose, Bld: 195 mg/dL — ABNORMAL HIGH (ref 70–99)
Potassium: 4.8 meq/L (ref 3.5–5.1)
Sodium: 138 meq/L (ref 135–145)

## 2012-07-07 LAB — FERRITIN: Ferritin: 633 ng/mL — ABNORMAL HIGH (ref 22–322)

## 2012-07-07 LAB — IRON AND TIBC
Iron: 135 ug/dL (ref 42–135)
Saturation Ratios: 44 % (ref 20–55)
TIBC: 308 ug/dL (ref 215–435)
UIBC: 173 ug/dL (ref 125–400)

## 2012-07-07 LAB — PHOSPHORUS: Phosphorus: 5.4 mg/dL — ABNORMAL HIGH (ref 2.3–4.6)

## 2012-07-07 LAB — GLUCOSE, CAPILLARY
Glucose-Capillary: 177 mg/dL — ABNORMAL HIGH (ref 70–99)
Glucose-Capillary: 160 mg/dL — ABNORMAL HIGH (ref 70–99)
Glucose-Capillary: 218 mg/dL — ABNORMAL HIGH (ref 70–99)
Glucose-Capillary: 218 mg/dL — ABNORMAL HIGH (ref 70–99)

## 2012-07-07 NOTE — Progress Notes (Signed)
SUBJECTIVE:  No chest pain.  No SOB.   PHYSICAL EXAM Filed Vitals:   07/06/12 1506 07/06/12 2014 07/07/12 0254 07/07/12 0428  BP: 162/68 158/75  162/71  Pulse: 77 73  70  Temp: 98 F (36.7 C) 97.7 F (36.5 C)  98 F (36.7 C)  TempSrc: Oral Oral  Oral  Resp: 18 18  18   Height:      Weight:   291 lb 6.4 oz (132.178 kg)   SpO2: 98% 97%  96%   General:  No distress Lungs:  Clear Heart:  RRR Abdomen:  Positive bowel sounds, no rebound no guarding Extremities:  Trace edema.  No cellulitis.   LABS: Lab Results  Component Value Date   CKTOTAL 26 07/06/2012   Results for orders placed during the hospital encounter of 06/26/12 (from the past 24 hour(s))  CBC     Status: Normal   Collection Time   07/06/12 10:58 AM      Component Value Range   WBC 9.8  4.0 - 10.5 K/uL   RBC 4.47  4.22 - 5.81 MIL/uL   Hemoglobin 13.8  13.0 - 17.0 g/dL   HCT 40.9  81.1 - 91.4 %   MCV 93.1  78.0 - 100.0 fL   MCH 30.9  26.0 - 34.0 pg   MCHC 33.2  30.0 - 36.0 g/dL   RDW 78.2  95.6 - 21.3 %   Platelets 336  150 - 400 K/uL  GLUCOSE, CAPILLARY     Status: Abnormal   Collection Time   07/06/12 11:19 AM      Component Value Range   Glucose-Capillary 182 (*) 70 - 99 mg/dL   Comment 1 Documented in Chart     Comment 2 Notify RN    GLUCOSE, CAPILLARY     Status: Abnormal   Collection Time   07/06/12  4:25 PM      Component Value Range   Glucose-Capillary 209 (*) 70 - 99 mg/dL   Comment 1 Documented in Chart     Comment 2 Notify RN    GLUCOSE, CAPILLARY     Status: Abnormal   Collection Time   07/06/12  9:19 PM      Component Value Range   Glucose-Capillary 154 (*) 70 - 99 mg/dL  BASIC METABOLIC PANEL     Status: Abnormal   Collection Time   07/07/12  6:00 AM      Component Value Range   Sodium 138  135 - 145 mEq/L   Potassium 4.8  3.5 - 5.1 mEq/L   Chloride 99  96 - 112 mEq/L   CO2 26  19 - 32 mEq/L   Glucose, Bld 195 (*) 70 - 99 mg/dL   BUN 81 (*) 6 - 23 mg/dL   Creatinine, Ser  0.86 (*) 0.50 - 1.35 mg/dL   Calcium 9.7  8.4 - 57.8 mg/dL   GFR calc non Af Amer 15 (*) >90 mL/min   GFR calc Af Amer 18 (*) >90 mL/min  PHOSPHORUS     Status: Abnormal   Collection Time   07/07/12  6:00 AM      Component Value Range   Phosphorus 5.4 (*) 2.3 - 4.6 mg/dL  GLUCOSE, CAPILLARY     Status: Abnormal   Collection Time   07/07/12  6:06 AM      Component Value Range   Glucose-Capillary 177 (*) 70 - 99 mg/dL    Intake/Output Summary (Last 24 hours) at 07/07/12 0725 Last  data filed at 07/07/12 0239  Gross per 24 hour  Intake    723 ml  Output    824 ml  Net   -101 ml    ASSESSMENT AND PLAN:  1. 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  Currently on Zyvox.  Further plans for antibiotics per ID. We might consider sending him home after his teeth have been addressed and before surgery.  We would do another TEE prior to this however.   2. DM: Cont lantus/ssi.  Continuing Glucotrol.  Will add HS scale per Diabetes Program recs.   3. Acute renal failure: In setting of vancomycin toxicity. Creat slowly improving.     4. HTN:  Holding Benicar. Continue norvasc.   5: Dental:  I will contact the oral surgeons again to see what the plan is  Rollene Rotunda 07/07/2012 7:25 AM

## 2012-07-07 NOTE — Progress Notes (Addendum)
INFECTIOUS DISEASE PROGRESS NOTE  ID: Cody Hale is a 61 y.o. male with   Principal Problem:  *Endocarditis Active Problems:  Recurrent cellulitis of lower leg  Type 2 diabetes mellitus  Poor dentition  SVT (supraventricular tachycardia)  Acute renal insufficiency  Normocytic anemia  Thrombocytopenia  History of kidney stones  Depression  Mitral regurgitation  Vegetation of heart valve  Morbid obesity  Rash  LBBB (left bundle branch block)  Subjective: Remains afebrile. Rash on axilla continues to improve ( rash thought to be due to daptomycin). Renal function continues to improve Cr 3.93 down from 4.6  Abtx:  Day #6 linezolid ( 2 doses from 12/22-12/26) Medications:  Scheduled:    . amLODipine  10 mg Oral Daily  . aspirin EC  81 mg Oral Daily  . chlorhexidine  15 mL Mouth/Throat BID  . gabapentin  100 mg Oral TID  . glipiZIDE  10 mg Oral QAC breakfast  . heparin  5,000 Units Subcutaneous Q8H  . insulin aspart  0-15 Units Subcutaneous TID WC  . insulin glargine  20 Units Subcutaneous QHS  . linezolid  600 mg Intravenous Q12H  . multivitamin with minerals  1 tablet Oral Daily  . omega-3 acid ethyl esters  1 g Oral Daily  . sertraline  50 mg Oral Daily  . sodium bicarbonate  1,300 mg Oral BID  . sodium chloride  10 mL Intravenous Q12H  . sodium chloride  3 mL Intravenous Q12H    Objective: Vital signs in last 24 hours: Temp:  [97.7 F (36.5 C)-98.1 F (36.7 C)] 98.1 F (36.7 C) (12/31 1400) Pulse Rate:  [70-81] 81  (12/31 1400) Resp:  [18] 18  (12/31 1400) BP: (156-164)/(67-75) 156/67 mmHg (12/31 1400) SpO2:  [96 %-99 %] 99 % (12/31 1400) Weight:  [291 lb 6.4 oz (132.178 kg)] 291 lb 6.4 oz (132.178 kg) (12/31 0254)   General appearance: alert, cooperative and no distress Resp: clear to auscultation bilaterally Cardio: regular rate and rhythm GI: normal findings: bowel sounds normal and soft, non-tender Skin: rash on his back and UE are improving,  less intense.   Lab Results  Basename 07/07/12 0600 07/06/12 1058 07/06/12 0605  WBC -- 9.8 --  HGB -- 13.8 --  HCT -- 41.6 --  NA 138 -- 136  K 4.8 -- 4.7  CL 99 -- 96  CO2 26 -- 25  BUN 81* -- 88*  CREATININE 3.93* -- 4.64*  GLU -- -- --    Microbiology: No blood cultures at [facility] Studies/Results: No results found.   Assessment/Plan:61 yo Male with who presented with sepsis at OSH, hypotension with sBP 60, fevers, leukocytosis with bandemia. He was started on broad spectrum antibiotics with vanco and piptazo, developed AKI with cr 8. Blood cultures were negative but did have TTE which found native MV vegetation. He was transferred for management of AKI and endocarditis, culture negative (likely due due to prior antibiotic exposure), source thought to be related to poor dentition vs. LE cellulitis. He was switched to daptomycin but developed rash to torso and worsening AKI. Thus his atbx changed to linezolid.  Per [facility] criteria: he has possibly 2 major (MV vegetation and regurgitation) and 1 minor critera (fever)  MV endocarditis = have sent off for various labs inc. Bartonella, chlamydia, fungal serologies. Currently on linezolid, but will need to change to culture negative coverage given his drug rash to daptomycin and aki from vancomycin. He will need a TEE to evaluate valve  to see if he needs valve replacement, would appreciate for valve tissue to be sent for culture and PCR testing.  Once his kidney function improves, closer to his baseline, we will need to change his antibiotics to a regimen that is more appropriate for culture negative, to also include strep, endocarditis.  Will draw blood cultures to document clearance  Drug related Rash = has been off of daptomycin since 12/25, rash is steadily improving  MV IE work up = will that the patient see oral surgeon for dental extraction eval today  ARF = multi factorial due to vanco, hypotension, NSAID.continue with renally  dosing of medications.   Risk of serotonin syndrome while on linezolid = if we decide to keep on linezolid will need to discontinue zoloft. Consider stopping zoloft temporarily while on linezolid  Total days of antibiotics 16 (vanco --> daptomycin 12-22--> zyvox 12-26)   Cody Hale Infect[facility] [phone] 07/07/2012, 2:41 PM   LOS: 11 days

## 2012-07-08 LAB — BASIC METABOLIC PANEL
BUN: 73 mg/dL — ABNORMAL HIGH (ref 6–23)
CO2: 28 mEq/L (ref 19–32)
Calcium: 9.9 mg/dL (ref 8.4–10.5)
Chloride: 97 mEq/L (ref 96–112)
Creatinine, Ser: 3.46 mg/dL — ABNORMAL HIGH (ref 0.50–1.35)
GFR calc Af Amer: 20 mL/min — ABNORMAL LOW (ref >90)
GFR calc non Af Amer: 18 mL/min — ABNORMAL LOW (ref >90)
Glucose, Bld: 237 mg/dL — ABNORMAL HIGH (ref 70–99)
Potassium: 5 mEq/L (ref 3.5–5.1)
Sodium: 139 mEq/L (ref 135–145)

## 2012-07-08 LAB — GLUCOSE, CAPILLARY
Glucose-Capillary: 218 mg/dL — ABNORMAL HIGH (ref 70–99)
Glucose-Capillary: 134 mg/dL — ABNORMAL HIGH (ref 70–99)
Glucose-Capillary: 159 mg/dL — ABNORMAL HIGH (ref 70–99)

## 2012-07-08 MED ORDER — SODIUM BICARBONATE 650 MG PO TABS
650.0000 mg | ORAL_TABLET | Freq: Two times a day (BID) | ORAL | Status: DC
  Administered 2012-07-08 – 2012-07-09 (×4): 650 mg via ORAL
  Filled 2012-07-08 (×6): qty 1

## 2012-07-08 NOTE — Progress Notes (Signed)
INFECTIOUS DISEASE PROGRESS NOTE  ID: Cody Hale is a 62 y.o. male with  DM, presented at outside hospital with sepsis of unknown origin, found to have MV vegetation on TTE but blood cultures negative, was placed on vancomycin and piptazo, but developed significant non-oligouric AKI, transferred to [facility] for further management. Thus far has had slowly improved kidney function.  Principal Problem:  *Endocarditis Active Problems:  Recurrent cellulitis of lower leg  Type 2 diabetes mellitus  Poor dentition  SVT (supraventricular tachycardia)  Acute renal insufficiency  Normocytic anemia  Thrombocytopenia  History of kidney stones  Depression  Mitral regurgitation  Vegetation of heart valve  Morbid obesity  Rash  LBBB (left bundle branch block)  Subjective: Remains afebrile. Rash on axilla nearly resolved ( rash thought to be due to daptomycin). Renal function continues to improve Cr 3.46 down from 3.93  24hr event: surveillance blood cx drawn  Abtx:  Day #7 linezolid ( 2 doses of daptomycin QOD from 12/22-12/26) Medications:  Scheduled:    . amLODipine  10 mg Oral Daily  . aspirin EC  81 mg Oral Daily  . chlorhexidine  15 mL Mouth/Throat BID  . gabapentin  100 mg Oral TID  . glipiZIDE  10 mg Oral QAC breakfast  . heparin  5,000 Units Subcutaneous Q8H  . insulin aspart  0-15 Units Subcutaneous TID WC  . insulin glargine  20 Units Subcutaneous QHS  . linezolid  600 mg Intravenous Q12H  . multivitamin with minerals  1 tablet Oral Daily  . omega-3 acid ethyl esters  1 g Oral Daily  . sodium bicarbonate  650 mg Oral BID  . sodium chloride  10 mL Intravenous Q12H  . sodium chloride  3 mL Intravenous Q12H    Objective: Vital signs in last 24 hours: Temp:  [97.6 F (36.4 C)-98.1 F (36.7 C)] 97.6 F (36.4 C) (01/01 0416) Pulse Rate:  [77-83] 77  (01/01 0416) Resp:  [18] 18  (01/01 0416) BP: (122-156)/(57-67) 122/57 mmHg (01/01 0416) SpO2:  [93 %-99 %] 93 % (01/01  0416) Weight:  [292 lb 12.8 oz (132.813 kg)] 292 lb 12.8 oz (132.813 kg) (01/01 0449)   General appearance: alert, cooperative and no distress Resp: clear to auscultation bilaterally Cardio: regular rate and rhythm GI: normal findings: bowel sounds normal and soft, non-tender Skin: rash on his back and UE are improving, less intense.   Lab Results  Basename 07/08/12 0605 07/07/12 0600 07/06/12 1058  WBC -- -- 9.8  HGB -- -- 13.8  HCT -- -- 41.6  NA 139 138 --  K 5.0 4.8 --  CL 97 99 --  CO2 28 26 --  BUN 73* 81* --  CREATININE 3.46* 3.93* --  GLU -- -- --    Microbiology: 12/31 blood cx NGTD Studies/Results: No results found.   Assessment/Plan:61 yo Male with who presented with sepsis at OSH, hypotension with sBP 60, fevers, leukocytosis with bandemia. He was started on broad spectrum antibiotics with vanco and piptazo, developed AKI with cr 8. Blood cultures were negative but did have TTE which found native MV vegetation. He was transferred for management of AKI and endocarditis, culture negative (likely due due to prior antibiotic exposure), source thought to be related to poor dentition vs. LE cellulitis. He was switched to daptomycin but developed rash to torso and worsening AKI. Thus his atbx changed to linezolid.  Per [facility] criteria: he has possibly 2 major (MV vegetation and regurgitation) and 1 minor critera (  fever)  MV endocarditis = culture negative labs such as  Bartonella, chlamydia, fungal serologies are still pending. Currently on day #7 of linezolid, but will need to change to more appropriate culture negative coverage given his drug rash to daptomycin and aki from vancomycin.   He is scheduled for  TEE  In the morning to evaluate valve to see if he needs valve replacement; if he goes to surgery, would appreciate for valve tissue to be sent for culture and PCR testing.  - will follow up on surveillance blood cultures  - oral surgeons attempted to be contacted,  will need to follow up in the am.  - since kidney function is steadily improving,  May consider change his antibiotics to a regimen that is more appropriate for culture negative, to also include strep, endocarditis. Can consider amp/sub, re-introducing vanco with very close observation.   Drug related Rash = has been off of daptomycin since 12/25, rash is steadily improving  AKI = multi factorial due to vanco, hypotension, NSAID.continue with renally dosing of medications. Appears to be steadily improving; hopefully will return to baseline.  Risk of serotonin syndrome while on linezolid = since he is on linezolid , we will temporarily discontinue zoloft.   Total days of antibiotics 17 (vanco --> daptomycin 12-22--> zyvox 12-26)   [name] MD MPH [facility] [phone]  07/08/2012, 1:46 PM   LOS: 12 days

## 2012-07-08 NOTE — Progress Notes (Signed)
SUBJECTIVE:  No chest pain.  No SOB.   PHYSICAL EXAM Filed Vitals:   07/07/12 1400 07/07/12 1955 07/08/12 0416 07/08/12 0449  BP: 156/67 145/66 122/57   Pulse: 81 83 77   Temp: 98.1 F (36.7 C) 98.1 F (36.7 C) 97.6 F (36.4 C)   TempSrc: Oral Oral Oral   Resp: 18 18 18    Height:      Weight:    292 lb 12.8 oz (132.813 kg)  SpO2: 99% 98% 93%    General:  No distress Lungs:  Clear Heart:  RRR Abdomen:  Positive bowel sounds, no rebound no guarding Extremities:  Trace edema.  No cellulitis.   LABS: Lab Results  Component Value Date   CKTOTAL 26 07/06/2012   Results for orders placed during the hospital encounter of 06/26/12 (from the past 24 hour(s))  GLUCOSE, CAPILLARY     Status: Abnormal   Collection Time   07/07/12 11:27 AM      Component Value Range   Glucose-Capillary 160 (*) 70 - 99 mg/dL   Comment 1 Documented in Chart     Comment 2 Notify RN    GLUCOSE, CAPILLARY     Status: Abnormal   Collection Time   07/07/12  4:50 PM      Component Value Range   Glucose-Capillary 218 (*) 70 - 99 mg/dL  GLUCOSE, CAPILLARY     Status: Abnormal   Collection Time   07/07/12  9:15 PM      Component Value Range   Glucose-Capillary 218 (*) 70 - 99 mg/dL   Comment 1 Notify RN    BASIC METABOLIC PANEL     Status: Abnormal   Collection Time   07/08/12  6:05 AM      Component Value Range   Sodium 139  135 - 145 mEq/L   Potassium 5.0  3.5 - 5.1 mEq/L   Chloride 97  96 - 112 mEq/L   CO2 28  19 - 32 mEq/L   Glucose, Bld 237 (*) 70 - 99 mg/dL   BUN 73 (*) 6 - 23 mg/dL   Creatinine, Ser 1.61 (*) 0.50 - 1.35 mg/dL   Calcium 9.9  8.4 - 09.6 mg/dL   GFR calc non Af Amer 18 (*) >90 mL/min   GFR calc Af Amer 20 (*) >90 mL/min  GLUCOSE, CAPILLARY     Status: Abnormal   Collection Time   07/08/12  6:18 AM      Component Value Range   Glucose-Capillary 218 (*) 70 - 99 mg/dL   Comment 1 Notify RN     No intake or output data in the 24 hours ending 07/08/12 0755  ASSESSMENT  AND PLAN:  1. 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  Currently on Zyvox.  Further plans for antibiotics per ID. I will make him NPO for TEE tomorrow.  We have tried to get an oral surgery consult.  Dr. Tenny Craw and I both contacted the on call number and we were only able to leave the consult request information.  We called twice yesterday.  Unfortunately there is not yet a consult. We will try again today.    2. DM: Continuing Glucotrol, lantus and SSI  3. Acute renal failure: In setting of vancomycin toxicity. Creat slowly improving.  Down again today.     4. HTN:  Holding Benicar. Continue norvasc. BP OK.    5: Dental:  I will try again  to contact the oral surgeons.  Rollene Rotunda 07/08/2012 7:55 AM

## 2012-07-09 ENCOUNTER — Encounter (HOSPITAL_COMMUNITY): Payer: Self-pay

## 2012-07-09 ENCOUNTER — Encounter (HOSPITAL_COMMUNITY)
Admission: AD | Disposition: A | Discharge status: Home / Self Care | Payer: Self-pay | Source: Other Acute Inpatient Hospital | Attending: Internal Medicine

## 2012-07-09 DIAGNOSIS — K083 Retained dental root: Secondary | ICD-10-CM | POA: Diagnosis present

## 2012-07-09 DIAGNOSIS — I38 Endocarditis, valve unspecified: Secondary | ICD-10-CM

## 2012-07-09 DIAGNOSIS — I059 Rheumatic mitral valve disease, unspecified: Secondary | ICD-10-CM

## 2012-07-09 DIAGNOSIS — K0530 Chronic periodontitis, unspecified: Secondary | ICD-10-CM | POA: Diagnosis present

## 2012-07-09 DIAGNOSIS — Z954 Presence of other heart-valve replacement: Secondary | ICD-10-CM

## 2012-07-09 DIAGNOSIS — I4891 Unspecified atrial fibrillation: Secondary | ICD-10-CM

## 2012-07-09 DIAGNOSIS — K036 Deposits [accretions] on teeth: Secondary | ICD-10-CM | POA: Diagnosis present

## 2012-07-09 DIAGNOSIS — K045 Chronic apical periodontitis: Secondary | ICD-10-CM | POA: Diagnosis present

## 2012-07-09 HISTORY — PX: TEE WITHOUT CARDIOVERSION: SHX5443

## 2012-07-09 LAB — COMPREHENSIVE METABOLIC PANEL
ALT: 55 U/L — ABNORMAL HIGH (ref 0–53)
AST: 45 U/L — ABNORMAL HIGH (ref 0–37)
Albumin: 3.7 g/dL (ref 3.5–5.2)
Alkaline Phosphatase: 91 U/L (ref 39–117)
BUN: 58 mg/dL — ABNORMAL HIGH (ref 6–23)
CO2: 29 mEq/L (ref 19–32)
Calcium: 9.8 mg/dL (ref 8.4–10.5)
Chloride: 97 mEq/L (ref 96–112)
Creatinine, Ser: 2.68 mg/dL — ABNORMAL HIGH (ref 0.50–1.35)
GFR calc Af Amer: 28 mL/min — ABNORMAL LOW (ref >90)
GFR calc non Af Amer: 24 mL/min — ABNORMAL LOW (ref >90)
Glucose, Bld: 190 mg/dL — ABNORMAL HIGH (ref 70–99)
Potassium: 4.5 mEq/L (ref 3.5–5.1)
Sodium: 138 mEq/L (ref 135–145)
Total Bilirubin: 0.4 mg/dL (ref 0.3–1.2)
Total Protein: 7.7 g/dL (ref 6.0–8.3)

## 2012-07-09 LAB — CBC
HCT: 40.1 % (ref 39.0–52.0)
Hemoglobin: 13.2 g/dL (ref 13.0–17.0)
MCH: 31.1 pg (ref 26.0–34.0)
MCHC: 32.9 g/dL (ref 30.0–36.0)
MCV: 94.6 fL (ref 78.0–100.0)
Platelets: 302 10*3/uL (ref 150–400)
RBC: 4.24 MIL/uL (ref 4.22–5.81)
RDW: 12.6 % (ref 11.5–15.5)
WBC: 6.6 10*3/uL (ref 4.0–10.5)

## 2012-07-09 LAB — GLUCOSE, CAPILLARY
Glucose-Capillary: 221 mg/dL — ABNORMAL HIGH (ref 70–99)
Glucose-Capillary: 200 mg/dL — ABNORMAL HIGH (ref 70–99)
Glucose-Capillary: 179 mg/dL — ABNORMAL HIGH (ref 70–99)
Glucose-Capillary: 152 mg/dL — ABNORMAL HIGH (ref 70–99)
Glucose-Capillary: 218 mg/dL — ABNORMAL HIGH (ref 70–99)

## 2012-07-09 LAB — BASIC METABOLIC PANEL
BUN: 66 mg/dL — ABNORMAL HIGH (ref 6–23)
CO2: 27 mEq/L (ref 19–32)
Calcium: 9.6 mg/dL (ref 8.4–10.5)
Chloride: 96 mEq/L (ref 96–112)
Creatinine, Ser: 2.95 mg/dL — ABNORMAL HIGH (ref 0.50–1.35)
GFR calc Af Amer: 25 mL/min — ABNORMAL LOW (ref >90)
GFR calc non Af Amer: 21 mL/min — ABNORMAL LOW (ref >90)
Glucose, Bld: 231 mg/dL — ABNORMAL HIGH (ref 70–99)
Potassium: 4.5 mEq/L (ref 3.5–5.1)
Sodium: 137 mEq/L (ref 135–145)

## 2012-07-09 LAB — CHLAMYDIA ANTIBODIES, IGG
Chlamydia Pneumoniae IgG: 1:128 {titer} — ABNORMAL HIGH
Chlamydia Pneumoniae IgM: <1:10 {titer}
Chlamydia Psittaci IgG: <1:64 {titer}
Chlamydia psittaci Ab IgM: <1:10 {titer}
Chlamydia trachomatis IgG: <1:64 {titer}
Chlamydia trachomatis IgM: <1:10 {titer}

## 2012-07-09 LAB — MAGNESIUM: Magnesium: 1.7 mg/dL (ref 1.5–2.5)

## 2012-07-09 SURGERY — ECHOCARDIOGRAM, TRANSESOPHAGEAL
Anesthesia: Moderate Sedation

## 2012-07-09 MED ORDER — FENTANYL CITRATE 0.05 MG/ML IJ SOLN
INTRAMUSCULAR | Status: DC | PRN
  Administered 2012-07-09 (×2): 25 ug via INTRAVENOUS
  Administered 2012-07-09: 50 ug via INTRAVENOUS

## 2012-07-09 MED ORDER — SODIUM CHLORIDE 0.9 % IV SOLN: INTRAVENOUS | Status: DC

## 2012-07-09 MED ORDER — MIDAZOLAM HCL 2 MG/2ML IJ SOLN: 0.5000 mg | INTRAMUSCULAR | Status: DC | PRN

## 2012-07-09 MED ORDER — CEFAZOLIN SODIUM-DEXTROSE 2-3 GM-% IV SOLR
2.0000 g | Freq: Once | INTRAVENOUS | Status: AC
  Administered 2012-07-10: 2 g via INTRAVENOUS
  Filled 2012-07-09: qty 50

## 2012-07-09 MED ORDER — BUTAMBEN-TETRACAINE-BENZOCAINE 2-2-14 % EX AERO
INHALATION_SPRAY | CUTANEOUS | Status: DC | PRN
  Administered 2012-07-09: 2 via TOPICAL

## 2012-07-09 MED ORDER — MIDAZOLAM HCL 5 MG/ML IJ SOLN
INTRAMUSCULAR | Status: AC
  Filled 2012-07-09: qty 2

## 2012-07-09 MED ORDER — INSULIN ASPART 100 UNIT/ML ~~LOC~~ SOLN
0.0000 [IU] | Freq: Every day | SUBCUTANEOUS | Status: DC
  Administered 2012-07-09 – 2012-07-13 (×3): 2 [IU] via SUBCUTANEOUS

## 2012-07-09 MED ORDER — MIDAZOLAM HCL 10 MG/2ML IJ SOLN
INTRAMUSCULAR | Status: DC | PRN
  Administered 2012-07-09: 2 mg via INTRAVENOUS
  Administered 2012-07-09: 1 mg via INTRAVENOUS

## 2012-07-09 MED ORDER — FENTANYL CITRATE 0.05 MG/ML IJ SOLN
INTRAMUSCULAR | Status: AC
  Filled 2012-07-09: qty 2

## 2012-07-09 MED ORDER — FENTANYL CITRATE 0.05 MG/ML IJ SOLN: 50.0000 ug | INTRAMUSCULAR | Status: DC | PRN

## 2012-07-09 NOTE — Progress Notes (Signed)
Notified pt had 26 beat run of v-tach. Upon assessment, pt asymptomatic BP: 135/53, pulse: 80, O2: 94% RA. Pt was lying in bed during episode, no c/o CP or SOB. MD notified, Magnesium level ordered. Will continue to monitor pt closely and notify MD of any changes.

## 2012-07-09 NOTE — H&P (View-Only) (Signed)
Cardiology Progress Note Patient Name: Cody Hale Date of Encounter: 07/09/2012, 7:49 AM     Subjective  No overnight events. Sitting comfortably in chair without chest pain or sob.   Objective   Telemetry: sinus rhythm 70s, 5 beats NSVT  Medications: . amLODipine  10 mg Oral Daily  . aspirin EC  81 mg Oral Daily  . chlorhexidine  15 mL Mouth/Throat BID  . gabapentin  100 mg Oral TID  . glipiZIDE  10 mg Oral QAC breakfast  . heparin  5,000 Units Subcutaneous Q8H  . insulin aspart  0-15 Units Subcutaneous TID WC  . insulin glargine  20 Units Subcutaneous QHS  . linezolid  600 mg Intravenous Q12H  . multivitamin with minerals  1 tablet Oral Daily  . omega-3 acid ethyl esters  1 g Oral Daily  . sodium bicarbonate  650 mg Oral BID  . sodium chloride  10 mL Intravenous Q12H  . sodium chloride  3 mL Intravenous Q12H      Physical Exam: Temp:  [97 F (36.1 C)-98.6 F (37 C)] 98.6 F (37 C) (01/02 0502) Pulse Rate:  [74-75] 74  (01/02 0502) Resp:  [18-20] 20  (01/02 0502) BP: (156-169)/(75-81) 162/78 mmHg (01/02 0507) SpO2:  [94 %-98 %] 96 % (01/02 0502) Weight:  [291 lb 3.6 oz (132.1 kg)] 291 lb 3.6 oz (132.1 kg) (01/02 0500)  General: Overweight white male, in no acute distress. Head: Poor dentition. Normocephalic, atraumatic, sclera non-icteric, nares are without discharge.  Neck: Supple. Negative for carotid bruits or JVD Lungs: Clear bilaterally to auscultation without wheezes, rales, or rhonchi. Breathing is unlabored. Heart: RRR S1 S2 without murmurs, rubs, or gallops.  Abdomen: Obese, soft, non-tender, non-distended with normoactive bowel sounds. No rebound/guarding. No obvious abdominal masses. Msk:  Strength and tone appear normal for age. Extremities: No edema. No clubbing or cyanosis. Distal pedal pulses are intact and equal bilaterally. Neuro: Alert and oriented X 3. Moves all extremities spontaneously. Psych:  Responds to questions appropriately with a  normal affect.   Intake/Output Summary (Last 24 hours) at 07/09/12 0749 Last data filed at 07/08/12 1913  Gross per 24 hour  Intake    720 ml  Output      0 ml  Net    720 ml    Labs:  [facility] 07/09/12 0550 07/08/12 0605  NA 137 139  K 4.5 5.0  CL 96 97  CO2 27 28  GLUCOSE 231* 237*  BUN 66* 73*  CREATININE 2.95* 3.46*  CALCIUM 9.6 9.9   Basename 07/06/12 1058  WBC 9.8  HGB 13.8  HCT 41.6  MCV 93.1  PLT 336   Basename 07/07/12 0600  FERRITIN 633*  TIBC 308  IRON 135    Radiology/Studies:   06/27/2012 -  ORTHOPANTOGRAM/PANORAMIC  Findings: Multiple dental caries involving what I believe are teeth numbers 2, 3, 7, 15, and likely 18.  The central incisors were suboptimally imaged.  No periapical lucencies involving any of the teeth to suggest an abscess.  IMPRESSION: Multiple dental caries.    06/30/2012 - CHEST - 2 VIEW   Findings: Central line tip in the upper SVC region.  The lungs are clear without pulmonary edema or airspace disease.  Heart size is upper limits of normal.  Trachea is midline. There are bridging osteophytes or syndesmophytes in the thoracic spine.  IMPRESSION: Stable chest radiograph findings.  No evidence for pulmonary edema.  Central line tip in the  upper SVC region.      06/30/2012 - MRI HEAD WITHOUT CONTRAST  Findings: Negative for acute or chronic infarct.  No hemorrhage or mass lesion is present.  There is no edema in the brain.  No evidence of septic emboli.  Cerebral white matter is normal.  Brainstem is normal.  Cerebellum appears normal.  Paranasal sinuses show mild mucosal edema.  Vessels at the base of the brain are patent.  IMPRESSION: Normal MRI of the brain.  Mild chronic sinusitis.     06/27/2012  - RENAL/URINARY TRACT ULTRASOUND COMPLETE    Findings:  Right Kidney:  14.5 cm. No hydronephrosis or focal mass.  Left Kidney:  15.2 cm.  Not optimally visualized due to body habitus but no focal abnormality or hydronephrosis identified.   Bladder:  Normal.  IMPRESSION: Normal exam.     Assessment and Plan   1. 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 Culture negative likely secondary to antibiotics prior to culture. Source felt r/t poor dentition vs LE cellulitis. Currently on day 8 of Zyvox. Further plans for antibiotics per ID. He is NPO for TEE today.  Remains afebrile.  2. DM: CBGs elevated. Continue Glucotrol, lantus, and SSI. Add HS coverage.  3. Acute renal failure: In setting of vancomycin toxicity. Creat slowly improving. 8.42 --> 2.95  4. HTN: SBP elevated 150-160s on Norvasc 10mg  daily. Benicar on hold. Titration per MD recs  5: Dental: Dr. Robin Searing at bedside. Plans for dental extraction tomorrow.   Signed, HOPE, JESSICA PA-C  History and all data above reviewed.  Patient examined.  I agree with the findings as above.  No chest pain.  No SOB. The patient exam reveals COR:RRR, murmur unchanged  ,  Lungs: Clear  ,  Abd: Positive bowel sounds, no rebound no guarding, Ext Trace edema  .  All available labs, radiology testing, previous records reviewed. Agree with documented assessment and plan. Plan TEE today. Dental extraction in the AM.  Continue current therapy.    Cody Hale  11:46 AM  07/09/2012

## 2012-07-09 NOTE — Progress Notes (Signed)
[facility]  Subjective:  No complaints this AM. Excited to hear about Cr decrease. Ready for TEE today and tooth extraction tomorrow. wife at bedside   Objective: Vital signs in last 24 hours: Blood pressure 162/78, pulse 74, temperature 98.6 F (37 C), temperature source Oral, resp. rate 20, height 5\' 10"  (1.778 m), weight 291 lb 3.6 oz (132.1 kg), SpO2 96.00%.    PHYSICAL EXAM General--awake, alert, standing up about to wash face Chest--clear to auscultation bilaterally Heart--regular rate and rhythm, unable to appreciate previously heard murmur Abd--nontender Extr--trace edema R>L   UOP-not recorded  Lab Results:   Lab 07/09/12 0550 07/08/12 0605 07/07/12 0600  NA 137 139 138  K 4.5 5.0 4.8  CL 96 97 99  CO2 27 28 26   BUN 66* 73* 81*  CREATININE 2.95* 3.46* 3.93*  ALB -- -- --  GLUCOSE 231* -- --  CALCIUM 9.6 9.9 9.7  PHOS -- -- 5.4*     Basename 07/06/12 1058  WBC 9.8  HGB 13.8  HCT 41.6  PLT 336   I have reviewed the patient's current medications.  Assessment/Plan: 1.  Acute renal failure--Cr continues to trend down. Phos\ 5.4  2 days ago 2.  ABE mitral valve--per cardiology, ID, and TCTS.  On linezolid. TEE today.  3.  Carious non restorable teeth--plan for dental extraction 07/10/12.  4.  DM-2--per primary service 5.  Anemia--no longer anemic on 07/06/12. Consider recheck tomorrow. Iron studies unremarkable 6.  High BP--on amlodipine.  Elevated today. Consider additional agent.  7.  Nutrition--alb 2.9 on 24 Dec.  On supplements.  CMET tomorrow to check albumin.    LOS: 13 days   HUNTER, STEPHEN 07/09/2012,9:56 AM I have seen and examined this patient and agree with plan. Fe/TIBC 44%, ferrtitin 633--no IV Fe.  Cr decreasing a little each day.  For oral surgery tomorrow.    Nathan Moctezuma F,MD 07/09/2012 12:42 PM

## 2012-07-09 NOTE — Progress Notes (Signed)
     Cardiology Progress Note Patient Name: Cody Hale Date of Encounter: 07/09/2012, 7:49 AM     Subjective  No overnight events. Sitting comfortably in chair without chest pain or sob.   Objective   Telemetry: sinus rhythm 70s, 5 beats NSVT  Medications: . amLODipine  10 mg Oral Daily  . aspirin EC  81 mg Oral Daily  . chlorhexidine  15 mL Mouth/Throat BID  . gabapentin  100 mg Oral TID  . glipiZIDE  10 mg Oral QAC breakfast  . heparin  5,000 Units Subcutaneous Q8H  . insulin aspart  0-15 Units Subcutaneous TID WC  . insulin glargine  20 Units Subcutaneous QHS  . linezolid  600 mg Intravenous Q12H  . multivitamin with minerals  1 tablet Oral Daily  . omega-3 acid ethyl esters  1 g Oral Daily  . sodium bicarbonate  650 mg Oral BID  . sodium chloride  10 mL Intravenous Q12H  . sodium chloride  3 mL Intravenous Q12H      Physical Exam: Temp:  [97 F (36.1 C)-98.6 F (37 C)] 98.6 F (37 C) (01/02 0502) Pulse Rate:  [74-75] 74  (01/02 0502) Resp:  [18-20] 20  (01/02 0502) BP: (156-169)/(75-81) 162/78 mmHg (01/02 0507) SpO2:  [94 %-98 %] 96 % (01/02 0502) Weight:  [291 lb 3.6 oz (132.1 kg)] 291 lb 3.6 oz (132.1 kg) (01/02 0500)  General: Overweight white male, in no acute distress. Head: Poor dentition. Normocephalic, atraumatic, sclera non-icteric, nares are without discharge.  Neck: Supple. Negative for carotid bruits or JVD Lungs: Clear bilaterally to auscultation without wheezes, rales, or rhonchi. Breathing is unlabored. Heart: RRR S1 S2 without murmurs, rubs, or gallops.  Abdomen: Obese, soft, non-tender, non-distended with normoactive bowel sounds. No rebound/guarding. No obvious abdominal masses. Msk:  Strength and tone appear normal for age. Extremities: No edema. No clubbing or cyanosis. Distal pedal pulses are intact and equal bilaterally. Neuro: Alert and oriented X 3. Moves all extremities spontaneously. Psych:  Responds to questions appropriately with a  normal affect.   Intake/Output Summary (Last 24 hours) at 07/09/12 0749 Last data filed at 07/08/12 1913  Gross per 24 hour  Intake    720 ml  Output      0 ml  Net    720 ml    Labs:  Basename 07/09/12 0550 07/08/12 0605  NA 137 139  K 4.5 5.0  CL 96 97  CO2 27 28  GLUCOSE 231* 237*  BUN 66* 73*  CREATININE 2.95* 3.46*  CALCIUM 9.6 9.9   Basename 07/06/12 1058  WBC 9.8  HGB 13.8  HCT 41.6  MCV 93.1  PLT 336   Basename 07/07/12 0600  FERRITIN 633*  TIBC 308  IRON 135    Radiology/Studies:   06/27/2012 -  ORTHOPANTOGRAM/PANORAMIC  Findings: Multiple dental caries involving what I believe are teeth numbers 2, 3, 7, 15, and likely 18.  The central incisors were suboptimally imaged.  No periapical lucencies involving any of the teeth to suggest an abscess.  IMPRESSION: Multiple dental caries.    06/30/2012 - CHEST - 2 VIEW   Findings: Central line tip in the upper SVC region.  The lungs are clear without pulmonary edema or airspace disease.  Heart size is upper limits of normal.  Trachea is midline. There are bridging osteophytes or syndesmophytes in the thoracic spine.  IMPRESSION: Stable chest radiograph findings.  No evidence for pulmonary edema.  Central line tip in the   upper SVC region.      06/30/2012 - MRI HEAD WITHOUT CONTRAST  Findings: Negative for acute or chronic infarct.  No hemorrhage or mass lesion is present.  There is no edema in the brain.  No evidence of septic emboli.  Cerebral white matter is normal.  Brainstem is normal.  Cerebellum appears normal.  Paranasal sinuses show mild mucosal edema.  Vessels at the base of the brain are patent.  IMPRESSION: Normal MRI of the brain.  Mild chronic sinusitis.     06/27/2012  - RENAL/URINARY TRACT ULTRASOUND COMPLETE    Findings:  Right Kidney:  14.5 cm. No hydronephrosis or focal mass.  Left Kidney:  15.2 cm.  Not optimally visualized due to body habitus but no focal abnormality or hydronephrosis identified.   Bladder:  Normal.  IMPRESSION: Normal exam.     Assessment and Plan   1. 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 Culture negative likely secondary to antibiotics prior to culture. Source felt r/t poor dentition vs LE cellulitis. Currently on day 8 of Zyvox. Further plans for antibiotics per ID. He is NPO for TEE today.  Remains afebrile.  2. DM: CBGs elevated. Continue Glucotrol, lantus, and SSI. Add HS coverage.  3. Acute renal failure: In setting of vancomycin toxicity. Creat slowly improving. 8.42 --> 2.95  4. HTN: SBP elevated 150-160s on Norvasc 10mg daily. Benicar on hold. Titration per MD recs  5: Dental: Dr. Kulinsky at bedside. Plans for dental extraction tomorrow.   Signed, HOPE, JESSICA PA-C  History and all data above reviewed.  Patient examined.  I agree with the findings as above.  No chest pain.  No SOB. The patient exam reveals COR:RRR, murmur unchanged  ,  Lungs: Clear  ,  Abd: Positive bowel sounds, no rebound no guarding, Ext Trace edema  .  All available labs, radiology testing, previous records reviewed. Agree with documented assessment and plan. Plan TEE today. Dental extraction in the AM.  Continue current therapy.    Cody Hale  11:46 AM  07/09/2012  

## 2012-07-09 NOTE — Progress Notes (Signed)
Cardiology Night Coverage Note: Called regarding 26 beats of NSVT. Asymptomatic. Resting at the time. Hemodynamically stable, BP in the 130s/50s. Noted to have had 5 beat run per notes yesterday. EF normal on TEE today. K+ this evening 4.5, checking Mg. Noted that patient has LBBB as well as chart history of SVT and at admission on 12/20 had a wide complex tachycardia thought to be supraventricular.

## 2012-07-09 NOTE — Progress Notes (Signed)
  Echocardiogram Echocardiogram Transesophageal has been performed.  Cody Hale FRANCES 07/09/2012, 5:35 PM

## 2012-07-09 NOTE — Progress Notes (Signed)
INFECTIOUS DISEASE PROGRESS NOTE  ID: Cody Hale is a 62 y.o. male with  Principal Problem:  *Endocarditis Active Problems:  Recurrent cellulitis of lower leg  Type 2 diabetes mellitus  Poor dentition  SVT (supraventricular tachycardia)  Acute renal insufficiency  Normocytic anemia  Thrombocytopenia  History of kidney stones  Depression  Mitral regurgitation  Vegetation of heart valve  Morbid obesity  Rash  LBBB (left bundle branch block)  Chronic apical periodontitis  Chronic periodontitis  Retained dental root  Accretions on teeth  Subjective: Resting quietly, awakens easily. Without complaints. For dental extractions in AM.   Abtx:  Anti-infectives     Start     Dose/Rate Route Frequency Ordered Stop   07/10/12 0700   ceFAZolin (ANCEF) IVPB 2 g/50 mL premix        2 g 100 mL/hr over 30 Minutes Intravenous  Once 07/09/12 1032     07/02/12 1230   linezolid (ZYVOX) IVPB 600 mg        600 mg 300 mL/hr over 60 Minutes Intravenous Every 12 hours 07/02/12 1143     06/28/12 2000   DAPTOmycin (CUBICIN) 840 mg in sodium chloride 0.9 % IVPB  Status:  Discontinued        6 mg/kg  140 kg (Order-Specific) 233.6 mL/hr over 30 Minutes Intravenous Every 48 hours 06/28/12 1353 06/28/12 1406   06/28/12 2000   DAPTOmycin (CUBICIN) 864.5 mg in sodium chloride 0.9 % IVPB  Status:  Discontinued        6 mg/kg  144.1 kg 234.6 mL/hr over 30 Minutes Intravenous Every 48 hours 06/28/12 1406 07/02/12 1143          Medications:  Scheduled:   . amLODipine  10 mg Oral Daily  . aspirin EC  81 mg Oral Daily  .  ceFAZolin (ANCEF) IV  2 g Intravenous Once  . chlorhexidine  15 mL Mouth/Throat BID  . gabapentin  100 mg Oral TID  . glipiZIDE  10 mg Oral QAC breakfast  . heparin  5,000 Units Subcutaneous Q8H  . insulin aspart  0-15 Units Subcutaneous TID WC  . insulin aspart  0-5 Units Subcutaneous QHS  . insulin glargine  20 Units Subcutaneous QHS  . linezolid  600 mg  Intravenous Q12H  . multivitamin with minerals  1 tablet Oral Daily  . omega-3 acid ethyl esters  1 g Oral Daily  . sodium bicarbonate  650 mg Oral BID  . sodium chloride  10 mL Intravenous Q12H  . sodium chloride  3 mL Intravenous Q12H    Objective: Vital signs in last 24 hours: Temp:  [97 F (36.1 C)-98.6 F (37 C)] 98.6 F (37 C) (01/02 0502) Pulse Rate:  [74-75] 74  (01/02 0502) Resp:  [18-20] 20  (01/02 0502) BP: (156-169)/(75-81) 162/78 mmHg (01/02 0507) SpO2:  [94 %-98 %] 96 % (01/02 0502) Weight:  [132.1 kg (291 lb 3.6 oz)] 132.1 kg (291 lb 3.6 oz) (01/02 0500)   General appearance: alert, cooperative and no distress Resp: clear to auscultation bilaterally Cardio: regular rate and rhythm and systolic murmur: early systolic 2/6, crescendo at 2nd left intercostal space GI: normal findings: bowel sounds normal and soft, non-tender Extremities: edema 2+  Lab Results  Basename 07/09/12 0550 07/08/12 0605  WBC -- --  HGB -- --  HCT -- --  NA 137 139  K 4.5 5.0  CL 96 97  CO2 27 28  BUN 66* 73*  CREATININE 2.95* 3.46*  GLU -- --   Liver Panel No results found for this basename: PROT:2,ALBUMIN:2,AST:2,ALT:2,ALKPHOS:2,BILITOT:2,BILIDIR:2,IBILI:2 in the last 72 hours Sedimentation Rate No results found for this basename: ESRSEDRATE in the last 72 hours C-Reactive Protein No results found for this basename: CRP:2 in the last 72 hours  Microbiology: Recent Results (from the past 240 hour(s))  CULTURE, BLOOD (ROUTINE X 2)     Status: Normal (Preliminary result)   Collection Time   07/07/12  3:35 PM      Component Value Range Status Comment   Specimen Description BLOOD ARM LEFT   Final    Special Requests BOTTLES DRAWN AEROBIC AND ANAEROBIC 10CC   Final    Culture  Setup Time 07/07/2012 23:21   Final    Culture     Final    Value:        BLOOD CULTURE RECEIVED NO GROWTH TO DATE CULTURE WILL BE HELD FOR 5 DAYS BEFORE ISSUING A FINAL NEGATIVE REPORT   Report Status  PENDING   Incomplete   CULTURE, BLOOD (ROUTINE X 2)     Status: Normal (Preliminary result)   Collection Time   07/07/12  3:41 PM      Component Value Range Status Comment   Specimen Description BLOOD HAND RIGHT   Final    Special Requests BOTTLES DRAWN AEROBIC ONLY 5CC   Final    Culture  Setup Time 07/07/2012 23:21   Final    Culture     Final    Value:        BLOOD CULTURE RECEIVED NO GROWTH TO DATE CULTURE WILL BE HELD FOR 5 DAYS BEFORE ISSUING A FINAL NEGATIVE REPORT   Report Status PENDING   Incomplete     Studies/Results: No results found.   Assessment/Plan: Rash  MV IE (Cx - at [facility], post anbx)  From teeth vs LE cellulitis  ARF (? From vanco, hypotension, NSAID)   His Cr has been improving. Will hold on changing his anbx back to vanco despite this.  Will watch his mood since sertraline d/c (prev d/i pharamcy).  Check Cx- IE labs.   Total days of antibiotics 18    zyvox    Day 8          Johny Sax Infectious Diseases [phone] 07/09/2012, 11:19 AM   LOS: 13 days

## 2012-07-09 NOTE — Interval H&P Note (Signed)
History and Physical Interval Note:  07/09/2012 4:23 PM  Rollins D Si  has presented today for surgery, with the diagnosis of a fib, look at valve  The various methods of treatment have been discussed with the patient and family. After consideration of risks, benefits and other options for treatment, the patient has consented to  Procedure(s) (LRB) with comments: TRANSESOPHAGEAL ECHOCARDIOGRAM (TEE) (N/A) as a surgical intervention .  The patient's history has been reviewed, patient examined, no change in status, stable for surgery.  I have reviewed the patient's chart and labs.  Questions were answered to the patient's satisfaction.     Elyn Aquas.

## 2012-07-09 NOTE — Progress Notes (Signed)
Pt had 5 beats of V-tach, pt asymptomatic, with no complaints. Will continue to monitor.

## 2012-07-09 NOTE — CV Procedure (Signed)
    Transesophageal Echocardiogram Note  KRIST ROSENBOOM [id number] [birth year]  Procedure: Transesophageal Echocardiogram Indications: evaluate MV mass  Procedure Details Consent: Obtained Time Out: Verified patient identification, verified procedure, site/side was marked, verified correct patient position, special equipment/implants available, Radiology Safety Procedures followed,  medications/allergies/relevent history reviewed, required imaging and test results available.  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  There is a mobile mass on the ventricular side of the tip of  the  mitral valve.  This could be a vegetation as originally thought but also could be a piece of ruptured leaflet.  He has only trivial MR which does not support the theory of a torn leaflet.    3-D images obtained.  Will need to review these.   Aortic Valve: normal  Tricuspid Valve: normal  Pulmonic Valve: normal  Left Atrium/ Left atrial appendage: no thrombi  Atrial septum: no PFO by color doppler  Aorta: normal  RA:  There is a catheter in the RA  Complications: No apparent complications Patient did tolerate procedure well.    Vesta Mixer, Montez Hageman., MD, [facility] 07/09/2012, 4:52 PM
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
## 2012-07-10 ENCOUNTER — Encounter (HOSPITAL_COMMUNITY)
Admission: AD | Disposition: A | Discharge status: Home / Self Care | Payer: Self-pay | Source: Other Acute Inpatient Hospital | Attending: Internal Medicine

## 2012-07-10 ENCOUNTER — Encounter (HOSPITAL_COMMUNITY): Payer: Self-pay | Admitting: Anesthesiology

## 2012-07-10 ENCOUNTER — Inpatient Hospital Stay (HOSPITAL_COMMUNITY): Payer: MEDICAID | Admitting: Anesthesiology

## 2012-07-10 DIAGNOSIS — K083 Retained dental root: Secondary | ICD-10-CM

## 2012-07-10 DIAGNOSIS — K264 Chronic or unspecified duodenal ulcer with hemorrhage: Secondary | ICD-10-CM

## 2012-07-10 DIAGNOSIS — K045 Chronic apical periodontitis: Secondary | ICD-10-CM

## 2012-07-10 HISTORY — PX: MULTIPLE EXTRACTIONS WITH ALVEOLOPLASTY: SHX5342

## 2012-07-10 LAB — COMPREHENSIVE METABOLIC PANEL
ALT: 54 U/L — ABNORMAL HIGH (ref 0–53)
AST: 41 U/L — ABNORMAL HIGH (ref 0–37)
Albumin: 3.5 g/dL (ref 3.5–5.2)
Alkaline Phosphatase: 92 U/L (ref 39–117)
BUN: 58 mg/dL — ABNORMAL HIGH (ref 6–23)
CO2: 28 mEq/L (ref 19–32)
Calcium: 9.9 mg/dL (ref 8.4–10.5)
Chloride: 97 mEq/L (ref 96–112)
Creatinine, Ser: 2.74 mg/dL — ABNORMAL HIGH (ref 0.50–1.35)
GFR calc Af Amer: 27 mL/min — ABNORMAL LOW (ref >90)
GFR calc non Af Amer: 23 mL/min — ABNORMAL LOW (ref >90)
Glucose, Bld: 221 mg/dL — ABNORMAL HIGH (ref 70–99)
Potassium: 5.1 mEq/L (ref 3.5–5.1)
Sodium: 138 mEq/L (ref 135–145)
Total Bilirubin: 0.4 mg/dL (ref 0.3–1.2)
Total Protein: 7.6 g/dL (ref 6.0–8.3)

## 2012-07-10 LAB — CBC
HCT: 39.2 % (ref 39.0–52.0)
Hemoglobin: 12.3 g/dL — ABNORMAL LOW (ref 13.0–17.0)
MCH: 29.6 pg (ref 26.0–34.0)
MCHC: 31.4 g/dL (ref 30.0–36.0)
MCV: 94.5 fL (ref 78.0–100.0)
Platelets: 283 10*3/uL (ref 150–400)
RBC: 4.15 MIL/uL — ABNORMAL LOW (ref 4.22–5.81)
RDW: 12.6 % (ref 11.5–15.5)
WBC: 6 10*3/uL (ref 4.0–10.5)

## 2012-07-10 LAB — GLUCOSE, CAPILLARY
Glucose-Capillary: 207 mg/dL — ABNORMAL HIGH (ref 70–99)
Glucose-Capillary: 185 mg/dL — ABNORMAL HIGH (ref 70–99)
Glucose-Capillary: 178 mg/dL — ABNORMAL HIGH (ref 70–99)

## 2012-07-10 LAB — PHOSPHORUS: Phosphorus: 4.9 mg/dL — ABNORMAL HIGH (ref 2.3–4.6)

## 2012-07-10 SURGERY — MULTIPLE EXTRACTION WITH ALVEOLOPLASTY
Anesthesia: General | Site: Mouth | Wound class: Contaminated

## 2012-07-10 MED ORDER — OXYCODONE HCL 5 MG/5ML PO SOLN: 5.0000 mg | Freq: Once | ORAL | Status: DC | PRN

## 2012-07-10 MED ORDER — PROPOFOL 10 MG/ML IV BOLUS
INTRAVENOUS | Status: DC | PRN
  Administered 2012-07-10: 200 mg via INTRAVENOUS
  Administered 2012-07-10: 50 mg via INTRAVENOUS

## 2012-07-10 MED ORDER — PROMETHAZINE HCL 25 MG/ML IJ SOLN: 6.2500 mg | INTRAMUSCULAR | Status: DC | PRN

## 2012-07-10 MED ORDER — LIDOCAINE HCL (CARDIAC) 20 MG/ML IV SOLN
INTRAVENOUS | Status: DC | PRN
  Administered 2012-07-10: 100 mg via INTRAVENOUS

## 2012-07-10 MED ORDER — LIDOCAINE-EPINEPHRINE 2 %-1:100000 IJ SOLN
INTRAMUSCULAR | Status: DC | PRN
  Administered 2012-07-10: 10.8 mL

## 2012-07-10 MED ORDER — BOOST / RESOURCE BREEZE PO LIQD
1.0000 | Freq: Three times a day (TID) | ORAL | Status: DC
  Administered 2012-07-10 – 2012-07-15 (×14): 1 via ORAL

## 2012-07-10 MED ORDER — BUPIVACAINE-EPINEPHRINE 0.5% -1:200000 IJ SOLN
INTRAMUSCULAR | Status: DC | PRN
  Administered 2012-07-10: 3.6 mL

## 2012-07-10 MED ORDER — HYDROMORPHONE HCL PF 1 MG/ML IJ SOLN: 0.2500 mg | INTRAMUSCULAR | Status: DC | PRN

## 2012-07-10 MED ORDER — LACTATED RINGERS IV SOLN: INTRAVENOUS | Status: DC

## 2012-07-10 MED ORDER — SUCCINYLCHOLINE CHLORIDE 20 MG/ML IJ SOLN
INTRAMUSCULAR | Status: DC | PRN
  Administered 2012-07-10: 180 mg via INTRAVENOUS

## 2012-07-10 MED ORDER — 0.9 % SODIUM CHLORIDE (POUR BTL) OPTIME
TOPICAL | Status: DC | PRN
  Administered 2012-07-10: 1000 mL

## 2012-07-10 MED ORDER — MORPHINE SULFATE 2 MG/ML IJ SOLN: 2.0000 mg | INTRAMUSCULAR | Status: DC | PRN

## 2012-07-10 MED ORDER — ROCURONIUM BROMIDE 100 MG/10ML IV SOLN
INTRAVENOUS | Status: DC | PRN
  Administered 2012-07-10: 30 mg via INTRAVENOUS

## 2012-07-10 MED ORDER — ARTIFICIAL TEARS OP OINT
TOPICAL_OINTMENT | OPHTHALMIC | Status: DC | PRN
  Administered 2012-07-10: 1 via OPHTHALMIC

## 2012-07-10 MED ORDER — EPHEDRINE SULFATE 50 MG/ML IJ SOLN
INTRAMUSCULAR | Status: DC | PRN
  Administered 2012-07-10: 10 mg via INTRAVENOUS

## 2012-07-10 MED ORDER — MIDAZOLAM HCL 2 MG/2ML IJ SOLN: 1.0000 mg | INTRAMUSCULAR | Status: DC | PRN

## 2012-07-10 MED ORDER — SODIUM CHLORIDE 0.9 % IR SOLN: 200.0000 mL | Status: DC

## 2012-07-10 MED ORDER — FENTANYL CITRATE 0.05 MG/ML IJ SOLN
INTRAMUSCULAR | Status: DC | PRN
  Administered 2012-07-10: 50 ug via INTRAVENOUS
  Administered 2012-07-10: 100 ug via INTRAVENOUS

## 2012-07-10 MED ORDER — OXYCODONE-ACETAMINOPHEN 5-325 MG PO TABS
1.0000 | ORAL_TABLET | ORAL | Status: DC | PRN
  Administered 2012-07-10: 1 via ORAL
  Filled 2012-07-10: qty 1

## 2012-07-10 MED ORDER — BUPIVACAINE-EPINEPHRINE (PF) 0.5% -1:200000 IJ SOLN
INTRAMUSCULAR | Status: AC
  Filled 2012-07-10: qty 3.6

## 2012-07-10 MED ORDER — LIDOCAINE-EPINEPHRINE 2 %-1:100000 IJ SOLN
INTRAMUSCULAR | Status: AC
  Filled 2012-07-10: qty 8.5

## 2012-07-10 MED ORDER — OXYCODONE HCL 5 MG PO TABS: 5.0000 mg | ORAL_TABLET | Freq: Once | ORAL | Status: DC | PRN

## 2012-07-10 MED ORDER — HEMOSTATIC AGENTS (NO CHARGE) OPTIME
TOPICAL | Status: DC | PRN
  Administered 2012-07-10: 1 via TOPICAL

## 2012-07-10 MED ORDER — SODIUM CHLORIDE 0.9 % IV SOLN
INTRAVENOUS | Status: DC | PRN
  Administered 2012-07-10 (×2): via INTRAVENOUS

## 2012-07-10 MED ORDER — FENTANYL CITRATE 0.05 MG/ML IJ SOLN: 50.0000 ug | Freq: Once | INTRAMUSCULAR | Status: DC

## 2012-07-10 SURGICAL SUPPLY — 36 items
ALCOHOL 70% 16 OZ (MISCELLANEOUS) ×2 IMPLANT
ATTRACTOMAT 16X20 MAGNETIC DRP (DRAPES) ×2 IMPLANT
BLADE SURG 15 STRL LF DISP TIS (BLADE) ×2 IMPLANT
BLADE SURG 15 STRL SS (BLADE) ×4
CLOTH BEACON ORANGE TIMEOUT ST (SAFETY) ×2 IMPLANT
COVER SURGICAL LIGHT HANDLE (MISCELLANEOUS) ×2 IMPLANT
CRADLE DONUT ADULT HEAD (MISCELLANEOUS) ×1 IMPLANT
GAUZE PACKING FOLDED 2  STR (GAUZE/BANDAGES/DRESSINGS) ×1
GAUZE PACKING FOLDED 2 STR (GAUZE/BANDAGES/DRESSINGS) ×1 IMPLANT
GAUZE SPONGE 4X4 16PLY XRAY LF (GAUZE/BANDAGES/DRESSINGS) ×2 IMPLANT
GLOVE SURG ORTHO 8.0 STRL STRW (GLOVE) ×2 IMPLANT
GLOVE SURG SS PI 6.5 STRL IVOR (GLOVE) ×2 IMPLANT
GOWN STRL REIN 3XL LVL4 (GOWN DISPOSABLE) ×2 IMPLANT
HEMOSTAT SURGICEL .5X2 ABSORB (HEMOSTASIS) IMPLANT
KIT BASIN OR (CUSTOM PROCEDURE TRAY) ×2 IMPLANT
KIT ROOM TURNOVER OR (KITS) ×2 IMPLANT
MANIFOLD NEPTUNE WASTE (CANNULA) ×2 IMPLANT
NDL BLUNT 16X1.5 OR ONLY (NEEDLE) ×1 IMPLANT
NDL DENTAL 27 LONG (NEEDLE) IMPLANT
NEEDLE BLUNT 16X1.5 OR ONLY (NEEDLE) ×2 IMPLANT
NEEDLE DENTAL 27 LONG (NEEDLE) IMPLANT
NS IRRIG 1000ML POUR BTL (IV SOLUTION) ×2 IMPLANT
PACK EENT II TURBAN DRAPE (CUSTOM PROCEDURE TRAY) ×2 IMPLANT
PAD ARMBOARD 7.5X6 YLW CONV (MISCELLANEOUS) ×4 IMPLANT
SPONGE SURGIFOAM ABS GEL 100 (HEMOSTASIS) IMPLANT
SPONGE SURGIFOAM ABS GEL 12-7 (HEMOSTASIS) ×1 IMPLANT
SPONGE SURGIFOAM ABS GEL SZ50 (HEMOSTASIS) IMPLANT
SUCTION FRAZIER TIP 10 FR DISP (SUCTIONS) ×1 IMPLANT
SUT CHROMIC 3 0 PS 2 (SUTURE) ×6 IMPLANT
SUT CHROMIC 4 0 P 3 18 (SUTURE) IMPLANT
SYR 50ML SLIP (SYRINGE) ×2 IMPLANT
TOWEL OR 17X24 6PK STRL BLUE (TOWEL DISPOSABLE) ×2 IMPLANT
TOWEL OR 17X26 10 PK STRL BLUE (TOWEL DISPOSABLE) ×2 IMPLANT
TUBE CONNECTING 12X1/4 (SUCTIONS) ×2 IMPLANT
WATER STERILE IRR 1000ML POUR (IV SOLUTION) ×2 IMPLANT
YANKAUER SUCT BULB TIP NO VENT (SUCTIONS) ×2 IMPLANT

## 2012-07-10 NOTE — Progress Notes (Signed)
Pt transferred to OR. Pt glasses in top drawer of side table.

## 2012-07-10 NOTE — Op Note (Signed)
Patient:            Cody Hale Date of Birth:  [birth year] MRN:                [id number]   DATE OF PROCEDURE:  07/10/2012               OPERATIVE REPORT   PREOPERATIVE DIAGNOSES: 1. Endocarditis with mitral valve vegetations 2  Pre-mitral valve surgery dental protocol 3. Chronic apical periodontitis 4. Multiple retained root segments 5. Accretions 6. Chronic periodontitis  POSTOPERATIVE DIAGNOSES: 1. Endocarditis with mitral valve vegetations 2  Pre-mitral valve surgery dental protocol 3. Chronic apical periodontitis 4. Multiple retained root segments 5. Accretions 6. Chronic periodontitis  OPERATIONS: 1. Multiple extraction of tooth numbers 2, 3, 7, 8, 9, 14, 23, 24, 26. 2. Four Quadrants of alveoloplasty 3. Gross debridement of remaining dentition   SURGEON: Charlynne Pander, DDS  ASSISTANT: Zettie Pho, (dental assistant)  ANESTHESIA: General anesthesia via nasoendotracheal tube.  MEDICATIONS: 1. Ancef 2 g IV prior to invasive dental procedures. 2. Local anesthesia with a total utilization of 4 carpules each containing 34 mg of lidocaine with 0.017 mg of epinephrine as well as 2 carpules each containing 9 mg of bupivacaine with 0.009 mg of epinephrine.  SPECIMENS: There are 9 teeth that were discarded.  DRAINS: None  CULTURES: None  COMPLICATIONS: None   ESTIMATED BLOOD LOSS: Less than 50 mLs.  INTRAVENOUS FLUIDS: Lactated ringers solution per anesthesia team record.  INDICATIONS: The patient was recently diagnosed with Endocarditis with vegetations involving the mitral valve.  A dental consultation was then requested to evaluate poor dentition.  The patient was examined and treatment planned for multiple extractions with alveoloplasty, pre-prosthetic surgery as indicated, and gross debridement of remaining dentition.  This treatment plan was formulated to decrease the risks and complications associated with dental infection from affecting the  patient's systemic health and the anticipated mitral valve surgery.  OPERATIVE FINDINGS: Patient was examined operating room number 6.  The teeth were identified for extraction. Tooth numbers 7 and 8 were added to the teeth that needed to be extracted due to significant tooth mobility and the presence of dental caries The patient was noted be affected by chronic periodontitis, apical periodontitis, multiple retained root segments, dental caries and tooth mobility.   DESCRIPTION OF PROCEDURE: Patient was brought to the main operating room number 6. Patient was then placed in the supine position on the operating table. General Anesthesia was then induced per the anesthesia team. The patient was then prepped and draped in the usual manner for dental medicine procedure. A timeout was performed. The patient was identified and procedures were verified. A throat pack was placed at this time. The oral cavity was then thoroughly examined with the findings noted above. The patient was then ready for dental medicine procedure as follows:  Local anesthesia was then administered sequentially with a total utilization of 4 carpules each containing 34 mg of lidocaine with 0.017 mg of epinephrine as well as 2 carpules  each containing 9 mg bupivacaine with 0.009 mg of epinephrine.  The Maxillary left and right quadrants first approached. Anesthesia was then delivered utilizing infiltration with lidocaine with epinephrine. A #15 blade incision was then made from the maxillary right tuberosity and extended to the distal of tooth #4.  A second 15 blade incision was then made from the mesial of #6 and extended to the distal of #11.  A third 15 blade incision was made from  the mesial of #15 and extended to the distal of #12. Surgical flaps were then carefully reflected. Appropriate amounts of buccal and interseptal bone were then removed utilizing a surgical handpiece and bur and copious amounts of sterile water. The teeth  were then subluxated with a series of straight elevators. Tooth numbers 2, 3, 7, 8, 9, 14 were then removed with a 150 forceps without complications. Alveoloplasty was then performed utilizing a ronguers and bone file. The surgical site was then irrigated with copious amounts of sterile saline. The tissues were approximated and trimmed appropriately. Surgifoam was placed in the extraction sites as indicated. The maxillary right surgical site was then closed from the maxillary right tuberosity and extended to the distal of #5 utilizing 3-0 chromic gut suture in a continuous interrupted suture technique x1. The maxillary anterior surgical site was then closed from the distal of #10 and extended to the  Mesial numbers 6 utilizing 3-0 chromic gut suture in a continuous interrupted suture technique x1. The maxillary left surgical site was then closed from the mesial of #15 and extended to the distal of #12 utilizing 3-0 chromic gut suture in a continuous interrupted suture technique x1.  At this point time, the mandibular quadrants were approached. The patient was given bilateral inferior alveolar nerve blocks and long buccal nerve blocks utilizing the bupivacaine with epinephrine. Further infiltration was then achieved utilizing the lidocaine with epinephrine. A 15 blade incision was then made from the distal of number 22 and extended to the distal of #27.  A surgical flap was then carefully reflected. Appropriate amounts of buccal and interseptal bone were then removed appropriately. Tooth numbers 23, 24, 25 then had the coronal aspect of the teeth removed with a 151 forceps leaving portions of the root segments remaining in all 3 areas. Further bone was then removed as indicated. The remaining root segments were then removed appropriately with a rongeur. Alveoloplasty was then performed utilizing a rongeurs and bone file. The tissues were approximated and trimmed appropriately. The surgical sites were then irrigated  with copious amounts of sterile saline. A piece of Surgifoam was placed in the extraction sockets appropriately. The surgical site was then closed with 8 separate interrupted sutures from the mesial of #22 with extension to the mesial of #27.  At this point time the remaining dentition was approached. A sonic scaler was used to remove significant accretions. A series of hand curettes were then utilized refine removal of accretions. A sonic scaler was then again used to further refine removal of accretions.  At this point time, the entire mouth was irrigated with copious amounts of sterile saline. The patient was exam for complications, seeing none, the dental medicine procedure was deemed to be complete. The throat pack was removed at this time. A series of 4 x 4 gauze were placed in the mouth to aid hemostasis. An oral airway was placed at the request of the anesthesia team.  The patient was then handed over to the anesthesia team for final disposition. After an appropriate amount of time, the patient was extubated and taken to the postanesthsia care unit with stable vital signs and a good condition. All counts were correct for the dental medicine procedure.  The patient may have his subcutaneous heparin reordered starting 12 hours from the end of the dental procedure (approximately 2200 hours).  Patient then may be discharged to home once medically stable per the cardiology team.  Patient to have his heart valve surgery scheduled with Dr.  Cornelius Moras per his discretion.  Charlynne Pander, DDS.

## 2012-07-10 NOTE — Progress Notes (Signed)
PRE-OPERATIVE NOTE:  07/10/2012 Cody Hale [id number]  VITALS: BP 157/69  Pulse 76  Temp 97.7 F (36.5 C) (Oral)  Resp 18  Ht 5\' 10"  (1.778 m)  Wt 289 lb 3.9 oz (131.2 kg)  BMI 41.50 kg/m2  SpO2 97%  Lab Results  Component Value Date   WBC 6.0 07/10/2012   HGB 12.3* 07/10/2012   HCT 39.2 07/10/2012   MCV 94.5 07/10/2012   PLT 283 07/10/2012   BMET    Component Value Date/Time   NA 138 07/10/2012 0555   K 5.1 07/10/2012 0555   CL 97 07/10/2012 0555   CO2 28 07/10/2012 0555   GLUCOSE 221* 07/10/2012 0555   BUN 58* 07/10/2012 0555   CREATININE 2.74* 07/10/2012 0555   CALCIUM 9.9 07/10/2012 0555   GFRNONAA 23* 07/10/2012 0555   GFRAA 27* 07/10/2012 0555    No results found for this basename: INR, PROTIME   No results found for this basename: PTT     Cody Hale presents for dental procedures in the operating room.   SUBJECTIVE: The patient denies any acute medical or dental changes and agrees to proceed with treatment as planned.  EXAM: No sign of acute dental changes.  ASSESSMENT: Patient is affected by apical periodontitis, chronic periodontitis, accretions and multiple retained roots.   PLAN: Patient agrees to proceed with treatment as planned in the operating room as previously discussed and accepts the risks, benefits, complications of the proposed treatment.    Charlynne Pander, DDS

## 2012-07-10 NOTE — Anesthesia Postprocedure Evaluation (Signed)
  Anesthesia Post-op Note  Patient: Cody Hale  Procedure(s) Performed: Procedure(s) (LRB) with comments: MULTIPLE EXTRACION WITH ALVEOLOPLASTY (N/A) - Extraction of tooth #'s 2,3,7,8,9,14,23,24,26 with alveoloplasty and gross debridement of teeth  Patient Location: PACU  Anesthesia Type:General  Level of Consciousness: awake and alert   Airway and Oxygen Therapy: Patient Spontanous Breathing  Post-op Pain: mild  Post-op Assessment: Post-op Vital signs reviewed, Patient's Cardiovascular Status Stable, Respiratory Function Stable, Patent Airway, No signs of Nausea or vomiting and Pain level controlled  Post-op Vital Signs: stable  Complications: No apparent anesthesia complications

## 2012-07-10 NOTE — Anesthesia Preprocedure Evaluation (Addendum)
Anesthesia Evaluation  Patient identified by MRN, date of birth, ID band Patient awake    Reviewed: Allergy & Precautions, H&P , NPO status , Patient's Chart, lab work & pertinent test results, reviewed documented beta blocker date and time   Airway Mallampati: II TM Distance: >3 FB Neck ROM: full    Dental  (+) Dental Advidsory Given and Poor Dentition   Pulmonary COPDformer smoker,  + rhonchi         Cardiovascular hypertension, + dysrhythmias Supra Ventricular Tachycardia + Valvular Problems/Murmurs MR Rhythm:Regular Rate:Normal     Neuro/Psych Depression    GI/Hepatic   Endo/Other  diabetes, Poorly Controlled, Type 1  Renal/GU ARFRenal disease     Musculoskeletal   Abdominal (+) + obese,   Peds  Hematology  (+) anemia ,   Anesthesia Other Findings   Reproductive/Obstetrics                          Anesthesia Physical Anesthesia Plan  ASA: III  Anesthesia Plan: General   Post-op Pain Management:    Induction: Intravenous  Airway Management Planned: Nasal ETT and Video Laryngoscope Planned  Additional Equipment:   Intra-op Plan:   Post-operative Plan: Extubation in OR  Informed Consent: I have reviewed the patients History and Physical, chart, labs and discussed the procedure including the risks, benefits and alternatives for the proposed anesthesia with the patient or authorized representative who has indicated his/her understanding and acceptance.   Dental Advisory Given  Plan Discussed with: CRNA and Surgeon  Anesthesia Plan Comments: (thrombocytopenia)      Anesthesia Quick Evaluation

## 2012-07-10 NOTE — Progress Notes (Signed)
INFECTIOUS DISEASE PROGRESS NOTE  ID: Cody Hale is a 62 y.o. male with  Principal Problem:  *Endocarditis Active Problems:  Recurrent cellulitis of lower leg  Type 2 diabetes mellitus  SVT (supraventricular tachycardia)  Acute renal insufficiency  Normocytic anemia  Thrombocytopenia  History of kidney stones  Depression  Mitral regurgitation  Vegetation of heart valve  Morbid obesity  Rash  LBBB (left bundle branch block)  Chronic periodontitis  Subjective: Without complaint, mouth packed, face iced.   Abtx:  Anti-infectives     Start     Dose/Rate Route Frequency Ordered Stop   07/10/12 0700   ceFAZolin (ANCEF) IVPB 2 g/50 mL premix        2 g 100 mL/hr over 30 Minutes Intravenous  Once 07/09/12 1032 07/10/12 0755   07/02/12 1230   linezolid (ZYVOX) IVPB 600 mg        600 mg 300 mL/hr over 60 Minutes Intravenous Every 12 hours 07/02/12 1143     06/28/12 2000   DAPTOmycin (CUBICIN) 840 mg in sodium chloride 0.9 % IVPB  Status:  Discontinued        6 mg/kg  140 kg (Order-Specific) 233.6 mL/hr over 30 Minutes Intravenous Every 48 hours 06/28/12 1353 06/28/12 1406   06/28/12 2000   DAPTOmycin (CUBICIN) 864.5 mg in sodium chloride 0.9 % IVPB  Status:  Discontinued        6 mg/kg  144.1 kg 234.6 mL/hr over 30 Minutes Intravenous Every 48 hours 06/28/12 1406 07/02/12 1143          Medications:  Scheduled:   . amLODipine  10 mg Oral Daily  . aspirin EC  81 mg Oral Daily  . chlorhexidine  15 mL Mouth/Throat BID  . fentaNYL  50-100 mcg Intravenous Once  . gabapentin  100 mg Oral TID  . glipiZIDE  10 mg Oral QAC breakfast  . insulin aspart  0-15 Units Subcutaneous TID WC  . insulin aspart  0-5 Units Subcutaneous QHS  . insulin glargine  20 Units Subcutaneous QHS  . linezolid  600 mg Intravenous Q12H  . multivitamin with minerals  1 tablet Oral Daily  . omega-3 acid ethyl esters  1 g Oral Daily  . sodium bicarbonate  650 mg Oral BID  . sodium chloride   10 mL Intravenous Q12H    Objective: Vital signs in last 24 hours: Temp:  [97.2 F (36.2 C)-99.1 F (37.3 C)] 98 F (36.7 C) (01/03 1030) Pulse Rate:  [75-98] 76  (01/03 1030) Resp:  [5-20] 18  (01/03 1030) BP: (140-188)/(54-89) 188/80 mmHg (01/03 1030) SpO2:  [92 %-100 %] 95 % (01/03 1030) Weight:  [131.2 kg (289 lb 3.9 oz)] 131.2 kg (289 lb 3.9 oz) (01/03 0500)   General appearance: alert, cooperative, mild distress and face iced, mouth packed.  Resp: clear to auscultation bilaterally Cardio: regular rate and rhythm GI: normal findings: bowel sounds normal and soft, non-tender  Lab Results  [facility] 07/10/12 0555 07/09/12 1805  WBC 6.0 6.6  HGB 12.3* 13.2  HCT 39.2 40.1  NA 138 138  K 5.1 4.5  CL 97 97  CO2 28 29  BUN 58* 58*  CREATININE 2.74* 2.68*  GLU -- --   Liver Panel  Basename 07/10/12 0555 07/09/12 1805  PROT 7.6 7.7  ALBUMIN 3.5 3.7  AST 41* 45*  ALT 54* 55*  ALKPHOS 92 91  BILITOT 0.4 0.4  BILIDIR -- --  IBILI -- --   Sedimentation Rate No  results found for this basename: ESRSEDRATE in the last 72 hours C-Reactive Protein No results found for this basename: CRP:2 in the last 72 hours  Microbiology: Recent Results (from the past 240 hour(s))  CULTURE, BLOOD (ROUTINE X 2)     Status: Normal (Preliminary result)   Collection Time   07/07/12  3:35 PM      Component Value Range Status Comment   Specimen Description BLOOD ARM LEFT   Final    Special Requests BOTTLES DRAWN AEROBIC AND ANAEROBIC 10CC   Final    Culture  Setup Time 07/07/2012 23:21   Final    Culture     Final    Value:        BLOOD CULTURE RECEIVED NO GROWTH TO DATE CULTURE WILL BE HELD FOR 5 DAYS BEFORE ISSUING A FINAL NEGATIVE REPORT   Report Status PENDING   Incomplete   CULTURE, BLOOD (ROUTINE X 2)     Status: Normal (Preliminary result)   Collection Time   07/07/12  3:41 PM      Component Value Range Status Comment   Specimen Description BLOOD HAND RIGHT   Final    Special  Requests BOTTLES DRAWN AEROBIC ONLY 5CC   Final    Culture  Setup Time 07/07/2012 23:21   Final    Culture     Final    Value:        BLOOD CULTURE RECEIVED NO GROWTH TO DATE CULTURE WILL BE HELD FOR 5 DAYS BEFORE ISSUING A FINAL NEGATIVE REPORT   Report Status PENDING   Incomplete     Studies/Results: No results found.   Assessment/Plan:  Rash  MV IE (Cx - at [facility], post anbx)  From teeth vs LE cellulitis  ARF (? From vanco, hypotension, NSAID)  His Cr has stabilized? Will watch his mood since sertraline d/c (prev d/i pharamcy).  Check Cx- IE labs (chlamydia negative).  Total days of antibiotics 19  zyvox Day 9 please have him f/u in ID clinic, available if questions   Johny Sax Infectious Diseases [phone] 07/10/2012, 10:51 AM   LOS: 14 days

## 2012-07-10 NOTE — Preoperative (Signed)
Beta Blockers   Reason not to administer Beta Blockers:Not Applicable 

## 2012-07-10 NOTE — Transfer of Care (Signed)
Immediate Anesthesia Transfer of Care Note  Patient: Cody Hale  Procedure(s) Performed: Procedure(s) (LRB) with comments: MULTIPLE EXTRACION WITH ALVEOLOPLASTY (N/A) - Extraction of tooth #'s 2,3,7,8,9,14,23,24,26 with alveoloplasty and gross debridement of teeth  Patient Location: PACU  Anesthesia Type:General  Level of Consciousness: awake, alert  and oriented  Airway & Oxygen Therapy: Patient Spontanous Breathing and Patient connected to nasal cannula oxygen  Post-op Assessment: Report given to PACU RN, Post -op Vital signs reviewed and stable and Patient moving all extremities X 4  Post vital signs: Reviewed and stable  Complications: No apparent anesthesia complications

## 2012-07-10 NOTE — Progress Notes (Signed)
Subjective:  No new cardiac complaints. The patient underwent oral surgery with removal of multiple carious teeth and tolerated well earlier today. Rhythm remains stable normal sinus rhythm with occasional short runs of SVT which are asymptomatic  Objective:  Vital Signs in the last 24 hours: Temp:  [97.2 F (36.2 C)-99.1 F (37.3 C)] 98 F (36.7 C) (01/03 1030) Pulse Rate:  [75-98] 76  (01/03 1030) Resp:  [5-20] 18  (01/03 1030) BP: (140-188)/(54-89) 188/80 mmHg (01/03 1030) SpO2:  [92 %-100 %] 95 % (01/03 1030) Weight:  [289 lb 3.9 oz (131.2 kg)] 289 lb 3.9 oz (131.2 kg) (01/03 0500)  Intake/Output from previous day:   Intake/Output from this shift: Total I/O In: 1300 [I.V.:1300] Out: 260 [Urine:250; Blood:10]     . amLODipine  10 mg Oral Daily  . aspirin EC  81 mg Oral Daily  . chlorhexidine  15 mL Mouth/Throat BID  . fentaNYL  50-100 mcg Intravenous Once  . gabapentin  100 mg Oral TID  . glipiZIDE  10 mg Oral QAC breakfast  . insulin aspart  0-15 Units Subcutaneous TID WC  . insulin aspart  0-5 Units Subcutaneous QHS  . insulin glargine  20 Units Subcutaneous QHS  . linezolid  600 mg Intravenous Q12H  . multivitamin with minerals  1 tablet Oral Daily  . omega-3 acid ethyl esters  1 g Oral Daily  . sodium bicarbonate  650 mg Oral BID  . sodium chloride  10 mL Intravenous Q12H      . lactated ringers    . sodium chloride irrigation      Physical Exam: The patient appears to be in no distress. General: Overweight white male, in no acute distress.  Head: Poor dentition. Normocephalic, atraumatic, sclera non-icteric, nares are without discharge.  Neck: Supple. Negative for carotid bruits or JVD  Lungs: Clear bilaterally to auscultation without wheezes, rales, or rhonchi. Breathing is unlabored.  Heart: Soft grade 1/6 systolic murmur at left sternal edge Abdomen: Obese, soft, non-tender, non-distended with normoactive bowel sounds. No rebound/guarding. No  obvious abdominal masses.  Msk: Strength and tone appear normal for age.  Extremities: No edema. No clubbing or cyanosis. Distal pedal pulses are intact and equal bilaterally.  Neuro: Alert and oriented X 3. Moves all extremities spontaneously.  Psych: Responds to questions appropriately with a normal affect.    Lab Results:  Basename 07/10/12 0555 07/09/12 1805  WBC 6.0 6.6  HGB 12.3* 13.2  PLT 283 302    Basename 07/10/12 0555 07/09/12 1805  NA 138 138  K 5.1 4.5  CL 97 97  CO2 28 29  GLUCOSE 221* 190*  BUN 58* 58*  CREATININE 2.74* 2.68*   No results found for this basename: TROPONINI:2,CK,MB:2 in the last 72 hours Hepatic Function Panel  Basename 07/10/12 0555  PROT 7.6  ALBUMIN 3.5  AST 41*  ALT 54*  ALKPHOS 92  BILITOT 0.4  BILIDIR --  IBILI --   No results found for this basename: CHOL in the last 72 hours No results found for this basename: PROTIME in the last 72 hours  Imaging: Imaging results have been reviewed  Cardiac Studies:  Assessment/Plan:  Patient Active Hospital Problem List: Endocarditis (06/26/2012)   Assessment: Culture-negative endocarditis.  TEE yesterday by Dr. 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 There is a mobile mass on the ventricular side of the tip of the mitral valve. This could be a vegetation as originally thought but  also could be a piece of ruptured leaflet. He has only trivial MR which does not support the theory of a torn leaflet.      Plan: Continue current therapy      LOS: 14 days    Cassell Clement 07/10/2012, 1:01 PM
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
## 2012-07-10 NOTE — Progress Notes (Signed)
[facility]  Subjective:  6:15 AM Wife at bedside and states he was just recently taken to surgery.   1:20 PM Patient with head wrapped after oral surgery. N Gives thumbs up to signal no issues. Wife without questions.    Objective: Vital signs in last 24 hours: Blood pressure 157/69, pulse 76, temperature 97.7 F (36.5 C), temperature source Oral, resp. rate 18, height 5\' 10"  (1.778 m), weight 291 lb 3.6 oz (132.1 kg), SpO2 97.00%.    PHYSICAL EXAM  General--slightly drowsy but awakens, head wrapped and gauze in mouth Chest--clear to auscultation bilaterally Heart--regular rate and rhythm, unable to appreciate previously heard murmur, no rub Abd--nontender Extr--trace edema   UOP-not recorded  Lab Results:   Lab 07/10/12 0555 07/09/12 1805 07/09/12 0550 07/07/12 0600  NA 138 138 137 --  K 5.1 4.5 4.5 --  CL 97 97 96 --  CO2 28 29 27  --  BUN 58* 58* 66* --  CREATININE 2.74* 2.68* 2.95* --  ALB -- -- -- --  GLUCOSE 221* -- -- --  CALCIUM 9.9 9.8 9.6 --  PHOS 4.9* -- -- 5.4*     Basename 07/09/12 1805  WBC 6.6  HGB 13.2  HCT 40.1  PLT 302   I have reviewed the patient's current medications.  Assessment/Plan: 1.  Acute renal failure--Cr continues to trend down down to 2.68 from 2.95 yesterday. Phos 5.4  12/31, 4.9 today. Stopped bicarb with bmet noting bicarb 28.   *will sign off, please call with any questions 2.  ABE mitral valve--per cardiology, ID, and TCTS.  On linezolid. TEE 1/2 showed likely vegetation on MV.  Patient also noted to have 26 beats of NSVT and was asymptomatic overnight. Cardiology alerted and managing. Magnesium below goal of 2.  3.  Carious non restorable teeth--plan for dental extraction today  07/10/12. Now s/p removal.  4.  DM-2--per primary service, several values >200 and infection likely contributing 5.  Anemia--no longer anemic on 07/06/12 nor on repeat 1/3.Iron studies unremarkable Te/TIBC 44% and Ferritin 633. No  requirement for IV Fe.  6.  High BP--on amlodipine.  Elevated again today. Consider additional agent if still elevated after surgery.  7.  Nutrition--alb 2.9 on 24 Dec.  On supplements.  Improved to 3.7 today.     LOS: 14 days   HUNTER, STEPHEN 07/10/2012,6:03 AM  I have seen and examined this patient and agree with plan.  Mr  Heying's renal fct continues to improve (SLOWLY) each day.  We will sign off.  Please call if renal help needed.  Marina Gravel F,MD 07/10/2012 6:02 PM

## 2012-07-10 NOTE — Anesthesia Procedure Notes (Signed)
Procedure Name: Intubation Date/Time: 07/10/2012 7:46 AM Performed by: Carmela Rima Pre-anesthesia Checklist: Patient identified, Timeout performed, Emergency Drugs available, Suction available and Patient being monitored Patient Re-evaluated:Patient Re-evaluated prior to inductionOxygen Delivery Method: Circle system utilized Preoxygenation: Pre-oxygenation with 100% oxygen Intubation Type: IV induction and Rapid sequence Ventilation: Mask ventilation without difficulty Grade View: Grade II Nasal Tubes: Right and Nasal Rae Tube size: 7.5 mm Number of attempts: 2 Airway Equipment and Method: Video-laryngoscopy (Red rubber cath used as well) Placement Confirmation: ETT inserted through vocal cords under direct vision,  positive ETCO2 and breath sounds checked- equal and bilateral Tube secured with: Tape Dental Injury: Teeth and Oropharynx as per pre-operative assessment

## 2012-07-11 LAB — BARTONELLA ANTIBODY PANEL: B henselae IgG: NEGATIVE

## 2012-07-11 LAB — BASIC METABOLIC PANEL
BUN: 49 mg/dL — ABNORMAL HIGH (ref 6–23)
CO2: 26 mEq/L (ref 19–32)
Calcium: 9.4 mg/dL (ref 8.4–10.5)
Chloride: 99 mEq/L (ref 96–112)
Creatinine, Ser: 2.46 mg/dL — ABNORMAL HIGH (ref 0.50–1.35)
GFR calc Af Amer: 31 mL/min — ABNORMAL LOW (ref >90)
GFR calc non Af Amer: 27 mL/min — ABNORMAL LOW (ref >90)
Glucose, Bld: 264 mg/dL — ABNORMAL HIGH (ref 70–99)
Potassium: 4.3 mEq/L (ref 3.5–5.1)
Sodium: 138 mEq/L (ref 135–145)

## 2012-07-11 LAB — GLUCOSE, CAPILLARY
Glucose-Capillary: 174 mg/dL — ABNORMAL HIGH (ref 70–99)
Glucose-Capillary: 145 mg/dL — ABNORMAL HIGH (ref 70–99)
Glucose-Capillary: 215 mg/dL — ABNORMAL HIGH (ref 70–99)
Glucose-Capillary: 87 mg/dL (ref 70–99)
Glucose-Capillary: 120 mg/dL — ABNORMAL HIGH (ref 70–99)
Glucose-Capillary: 138 mg/dL — ABNORMAL HIGH (ref 70–99)

## 2012-07-11 LAB — BARTONELLA ANITBODY PANEL: B henselae IgM: NEGATIVE

## 2012-07-11 MED ORDER — AMLODIPINE BESYLATE 5 MG PO TABS
5.0000 mg | ORAL_TABLET | Freq: Every day | ORAL | Status: DC
  Administered 2012-07-11 – 2012-07-15 (×5): 5 mg via ORAL
  Filled 2012-07-11 (×5): qty 1

## 2012-07-11 MED ORDER — METOPROLOL TARTRATE 25 MG PO TABS
25.0000 mg | ORAL_TABLET | Freq: Two times a day (BID) | ORAL | Status: DC
Start: 2012-07-11 — End: 2012-07-12
  Administered 2012-07-11 (×2): 25 mg via ORAL
  Filled 2012-07-11 (×4): qty 1

## 2012-07-11 NOTE — Progress Notes (Signed)
   Subjective:  Denies CP or dyspnea; complains of mouth pain  Objective:  Vital Signs in the last 24 hours: Temp:  [97.2 F (36.2 C)-99.1 F (37.3 C)] 99.1 F (37.3 C) (01/04 0500) Pulse Rate:  [74-98] 82  (01/04 0500) Resp:  [14-18] 18  (01/04 0500) BP: (113-188)/(69-84) 143/71 mmHg (01/04 0500) SpO2:  [92 %-100 %] 92 % (01/04 0500) Weight:  [288 lb 3.2 oz (130.727 kg)] 288 lb 3.2 oz (130.727 kg) (01/04 0327)  Intake/Output from previous day: 01/03 0701 - 01/04 0700 In: 1300 [I.V.:1300] Out: 560 [Urine:550; Blood:10] Intake/Output from this shift:       . amLODipine  10 mg Oral Daily  . aspirin EC  81 mg Oral Daily  . chlorhexidine  15 mL Mouth/Throat BID  . feeding supplement  1 Container Oral TID BM  . fentaNYL  50-100 mcg Intravenous Once  . gabapentin  100 mg Oral TID  . glipiZIDE  10 mg Oral QAC breakfast  . insulin aspart  0-15 Units Subcutaneous TID WC  . insulin aspart  0-5 Units Subcutaneous QHS  . insulin glargine  20 Units Subcutaneous QHS  . linezolid  600 mg Intravenous Q12H  . multivitamin with minerals  1 tablet Oral Daily  . omega-3 acid ethyl esters  1 g Oral Daily  . sodium chloride  10 mL Intravenous Q12H      . lactated ringers    . sodium chloride irrigation      Physical Exam: The patient appears to be in no distress. Head: s/p teeth extraction Neck: Supple.  Lungs: CTA Heart: RRR Abdomen: Obese, soft, non-tender, non-distended Extremities: No edema.  Neuro: Grossly intact    Lab Results:  Basename 07/10/12 0555 07/09/12 1805  WBC 6.0 6.6  HGB 12.3* 13.2  PLT 283 302    Basename 07/11/12 0600 07/10/12 0555  NA 138 138  K 4.3 5.1  CL 99 97  CO2 26 28  GLUCOSE 264* 221*  BUN 49* 58*  CREATININE 2.46* 2.74*   Hepatic Function Panel  Basename 07/10/12 0555  PROT 7.6  ALBUMIN 3.5  AST 41*  ALT 54*  ALKPHOS 92  BILITOT 0.4  BILIDIR --  IBILI --    Cardiac Studies:  Assessment/Plan:  1 subacute bacterial  endocarditis-continue antibiotics. There is trace mitral regurgitation on most recent transesophageal echocardiogram. Would complete course of antibiotics as outlined by infectious disease. Would then need repeat blood cultures 2 weeks after discontinuing his antibiotics. He will most likely need repeat TEE as well. At this point no plans for surgical intervention. 2 acute renal failure-BUN and creatinine continue to improve. Follow. 3 SVT-patient has short runs of wide-complex tachycardia with morphology similar to sinus. This is most likely SVT. Discontinue amlodipine and add metoprolol 25 mg by mouth twice a day. 4 status post teeth extraction 5 diabetes mellitus Plan possible discharge Monday if renal function continues to improve.    LOS: 15 days    Cody Hale 07/11/2012, 7:58 AM

## 2012-07-12 LAB — BASIC METABOLIC PANEL
BUN: 38 mg/dL — ABNORMAL HIGH (ref 6–23)
CO2: 28 mEq/L (ref 19–32)
Calcium: 9.7 mg/dL (ref 8.4–10.5)
Chloride: 96 mEq/L (ref 96–112)
Creatinine, Ser: 2.08 mg/dL — ABNORMAL HIGH (ref 0.50–1.35)
GFR calc Af Amer: 38 mL/min — ABNORMAL LOW (ref >90)
GFR calc non Af Amer: 33 mL/min — ABNORMAL LOW (ref >90)
Glucose, Bld: 213 mg/dL — ABNORMAL HIGH (ref 70–99)
Potassium: 4.3 mEq/L (ref 3.5–5.1)
Sodium: 136 mEq/L (ref 135–145)

## 2012-07-12 LAB — CBC
HCT: 39.2 % (ref 39.0–52.0)
Hemoglobin: 12.6 g/dL — ABNORMAL LOW (ref 13.0–17.0)
MCH: 30.4 pg (ref 26.0–34.0)
MCHC: 32.1 g/dL (ref 30.0–36.0)
MCV: 94.7 fL (ref 78.0–100.0)
Platelets: 260 10*3/uL (ref 150–400)
RBC: 4.14 MIL/uL — ABNORMAL LOW (ref 4.22–5.81)
RDW: 12.5 % (ref 11.5–15.5)
WBC: 6.5 10*3/uL (ref 4.0–10.5)

## 2012-07-12 LAB — GLUCOSE, CAPILLARY
Glucose-Capillary: 200 mg/dL — ABNORMAL HIGH (ref 70–99)
Glucose-Capillary: 104 mg/dL — ABNORMAL HIGH (ref 70–99)
Glucose-Capillary: 128 mg/dL — ABNORMAL HIGH (ref 70–99)
Glucose-Capillary: 209 mg/dL — ABNORMAL HIGH (ref 70–99)

## 2012-07-12 LAB — MAGNESIUM: Magnesium: 1.6 mg/dL (ref 1.5–2.5)

## 2012-07-12 MED ORDER — METOPROLOL TARTRATE 50 MG PO TABS
50.0000 mg | ORAL_TABLET | Freq: Two times a day (BID) | ORAL | Status: DC
  Administered 2012-07-12 – 2012-07-15 (×7): 50 mg via ORAL
  Filled 2012-07-12 (×9): qty 1

## 2012-07-12 MED ORDER — LINEZOLID 600 MG PO TABS
600.0000 mg | ORAL_TABLET | Freq: Two times a day (BID) | ORAL | Status: DC
  Administered 2012-07-12 – 2012-07-15 (×7): 600 mg via ORAL
  Filled 2012-07-12 (×8): qty 1

## 2012-07-12 NOTE — Progress Notes (Addendum)
   Subjective:  Denies CP or dyspnea; complains of mouth pain (improved)  Objective:  Vital Signs in the last 24 hours: Temp:  [97.9 F (36.6 C)-98.2 F (36.8 C)] 98.2 F (36.8 C) (01/05 0418) Pulse Rate:  [63-68] 65  (01/05 0418) Resp:  [16-18] 18  (01/05 0418) BP: (145-179)/(62-92) 145/82 mmHg (01/05 0418) SpO2:  [98 %-100 %] 98 % (01/05 0418) Weight:  [277 lb (125.646 kg)] 277 lb (125.646 kg) (01/05 0418)  Intake/Output from previous day: 01/04 0701 - 01/05 0700 In: -  Out: 850 [Urine:850] Intake/Output from this shift:       . amLODipine  5 mg Oral Daily  . aspirin EC  81 mg Oral Daily  . chlorhexidine  15 mL Mouth/Throat BID  . feeding supplement  1 Container Oral TID BM  . fentaNYL  50-100 mcg Intravenous Once  . gabapentin  100 mg Oral TID  . glipiZIDE  10 mg Oral QAC breakfast  . insulin aspart  0-15 Units Subcutaneous TID WC  . insulin aspart  0-5 Units Subcutaneous QHS  . insulin glargine  20 Units Subcutaneous QHS  . linezolid  600 mg Intravenous Q12H  . metoprolol tartrate  25 mg Oral BID  . multivitamin with minerals  1 tablet Oral Daily  . omega-3 acid ethyl esters  1 g Oral Daily  . sodium chloride  10 mL Intravenous Q12H      . lactated ringers    . sodium chloride irrigation      Physical Exam: The patient appears to be in no distress. Head: s/p teeth extraction Neck: Supple.  Lungs: CTA Heart: RRR, 1/6 systolic murmur Abdomen: Obese, soft, non-tender, non-distended Extremities: No edema.  Neuro: Grossly intact    Lab Results:  Basename 07/10/12 0555 07/09/12 1805  WBC 6.0 6.6  HGB 12.3* 13.2  PLT 283 302    Basename 07/11/12 0600 07/10/12 0555  NA 138 138  K 4.3 5.1  CL 99 97  CO2 26 28  GLUCOSE 264* 221*  BUN 49* 58*  CREATININE 2.46* 2.74*   Hepatic Function Panel  Basename 07/10/12 0555  PROT 7.6  ALBUMIN 3.5  AST 41*  ALT 54*  ALKPHOS 92  BILITOT 0.4  BILIDIR --  IBILI --    Cardiac  Studies:  Assessment/Plan:  1 subacute bacterial endocarditis-continue antibiotics. There is trace mitral regurgitation on most recent transesophageal echocardiogram. Would complete course of antibiotics as outlined by infectious disease. Would then need repeat blood cultures 2 weeks after discontinuing his antibiotics. He will need repeat TEE as well. At this point no plans for surgical intervention. Note surgery delayed previously due to acute renal insufficiency; no indication at present for surgery (patient without significant MR, afebrile on antibiotics, no embolic events). Repeat ECG in AM. 2 acute renal failure-BUN and creatinine continue to improve. BMET pending this AM. 3 SVT/NSVT-patient with runs of SVT and NSVT on telemetry. Discontinue amlodipine and increase metoprolol to 50 mg by mouth twice a day. Repeat MG. 4 status post teeth extraction 5 diabetes mellitus Plan possible discharge Monday if renal function continues to improve.    LOS: 16 days    Olga Millers 07/12/2012, 7:23 AM

## 2012-07-13 ENCOUNTER — Encounter (HOSPITAL_COMMUNITY): Payer: Self-pay | Admitting: Dentistry

## 2012-07-13 DIAGNOSIS — I059 Rheumatic mitral valve disease, unspecified: Secondary | ICD-10-CM

## 2012-07-13 DIAGNOSIS — I38 Endocarditis, valve unspecified: Secondary | ICD-10-CM

## 2012-07-13 LAB — FUNGAL ANTIBODIES PANEL, ID-BLOOD
Aspergillus Flavus Antibodies: NEGATIVE
Aspergillus Flavus Antibodies: NEGATIVE
Aspergillus Niger Antibodies: NEGATIVE
Aspergillus Niger Antibodies: NEGATIVE
Aspergillus fumigatus: NEGATIVE
Aspergillus fumigatus: NEGATIVE
Blastomyces Abs, Qn, DID: NEGATIVE
Blastomyces Abs, Qn, DID: NEGATIVE
Coccidioides Antibody ID: NEGATIVE
Coccidioides Antibody ID: NEGATIVE
Histoplasma Ab, Immunodiffusion: NEGATIVE
Histoplasma Ab, Immunodiffusion: NEGATIVE

## 2012-07-13 LAB — CHLAMYDIA ANTIBODIES, IGG
Chlamydia Pneumoniae IgG: 1:256 {titer} — ABNORMAL HIGH
Chlamydia Pneumoniae IgM: <1:10 {titer}
Chlamydia Psittaci IgG: <1:64 {titer}
Chlamydia psittaci Ab IgM: <1:10 {titer}
Chlamydia trachomatis IgG: <1:64 {titer}
Chlamydia trachomatis IgM: <1:10 {titer}

## 2012-07-13 LAB — CULTURE, BLOOD (ROUTINE X 2)
Culture: NO GROWTH
Culture: NO GROWTH

## 2012-07-13 LAB — GLUCOSE, CAPILLARY
Glucose-Capillary: 175 mg/dL — ABNORMAL HIGH (ref 70–99)
Glucose-Capillary: 199 mg/dL — ABNORMAL HIGH (ref 70–99)
Glucose-Capillary: 74 mg/dL (ref 70–99)
Glucose-Capillary: 238 mg/dL — ABNORMAL HIGH (ref 70–99)

## 2012-07-13 LAB — BASIC METABOLIC PANEL
BUN: 34 mg/dL — ABNORMAL HIGH (ref 6–23)
CO2: 27 mEq/L (ref 19–32)
Calcium: 9.4 mg/dL (ref 8.4–10.5)
Chloride: 96 mEq/L (ref 96–112)
Creatinine, Ser: 1.97 mg/dL — ABNORMAL HIGH (ref 0.50–1.35)
GFR calc Af Amer: 40 mL/min — ABNORMAL LOW (ref >90)
GFR calc non Af Amer: 35 mL/min — ABNORMAL LOW (ref >90)
Glucose, Bld: 165 mg/dL — ABNORMAL HIGH (ref 70–99)
Potassium: 4.2 mEq/L (ref 3.5–5.1)
Sodium: 136 mEq/L (ref 135–145)

## 2012-07-13 LAB — BARTONELLA ANITBODY PANEL: B henselae IgG: NEGATIVE

## 2012-07-13 LAB — BARTONELLA ANTIBODY PANEL: B henselae IgM: NEGATIVE

## 2012-07-13 LAB — CK: Total CK: 32 U/L (ref 7–232)

## 2012-07-13 NOTE — Progress Notes (Signed)
Inpatient Diabetes Program Recommendations  AACE/ADA: New Consensus Statement on Inpatient Glycemic Control (2013)  Target Ranges:  Prepandial:   less than 140 mg/dL      Peak postprandial:   less than 180 mg/dL (1-2 hours)      Critically ill patients:  140 - 180 mg/dL  Results for Cody Hale, Cody Hale (MRN [id number]) as of 07/13/2012 13:18  Ref. Range 07/12/2012 21:15 07/13/2012 05:52 07/13/2012 12:01  Glucose-Capillary Latest Range: 70-99 mg/dL 914 (H) 782 (H) 956 (H)   Inpatient Diabetes Program Recommendations Insulin - Basal: Increase Lantus to 25 units  Correction (SSI): Marland Kitchen Thank you  Piedad Climes [facility] Inpatient Diabetes Coordinator [phone]

## 2012-07-13 NOTE — Progress Notes (Signed)
POST OPERATIVE NOTE:  07/13/2012 CALLAGHAN LAVERDURE [id number]  VITALS: BP 126/60  Pulse 61  Temp 98.9 F (37.2 C) (Axillary)  Resp 19  Ht 5\' 10"  (1.778 m)  Wt 285 lb 3.2 oz (129.366 kg)  BMI 40.92 kg/m2  SpO2 94%  Saw D Leverette is status post multiple extractionsd with alveoloplasty and gross debridement of remaining teeth in the OR on 07/10/12.  SUBJECTIVE: Patient with some oral discomfort and is using oral pain medication as needed to control discomfort. Patient denies active oral bleeding. Patient is using salt water and chlorhexidine rinses as instructed. Patient was seen by dietician.  EXAM: No sign of infection, heme, or ooze. Sutures are intact. Intraoral and extraoral ecchymoses are noted. Chapped lips noted.  ASSESSMENT: Post operative course is consistent with dental procedures performed in the OR.   PLAN: 1. Continue salt water rinses every 2 hours while awake. 2. Continue Chlorhexidine rinses twice daily for the next 3 months. 3. Evaluation for suture removal in 7-10 days, however, sutures will dissolve on their own. 4. Patient is cleared dentally for possible surgical procedures as per Dr. Orvan July discretion. 5. Follow up with primary dentist of choice for evaluation for replacement of missing teeth once medically stable.   6. Patient will need antibiotic premedication before invasive dental procedures per AHA guidelines due to H/O endocardititis.   Charlynne Pander, DDS

## 2012-07-13 NOTE — Progress Notes (Signed)
   CARDIOTHORACIC SURGERY PROGRESS NOTE  3 Days Post-Op  S/P Procedure(s) (LRB): MULTIPLE EXTRACION WITH ALVEOLOPLASTY (N/A)  Subjective: No shortness of breath.  Mouth sore but improving.  Objective: Vital signs in last 24 hours: Temp:  [98.1 F (36.7 C)-98.9 F (37.2 C)] 98.9 F (37.2 C) (01/06 0403) Pulse Rate:  [58-65] 61  (01/06 0403) Cardiac Rhythm:  [-] Normal sinus rhythm;Bundle branch block (01/06 0001) Resp:  [14-19] 19  (01/06 0403) BP: (115-138)/(56-61) 126/60 mmHg (01/06 0403) SpO2:  [94 %-97 %] 94 % (01/06 0403) Weight:  [129.366 kg (285 lb 3.2 oz)] 129.366 kg (285 lb 3.2 oz) (01/06 0500)  Physical Exam:  Rhythm:   sinus  Breath sounds: clear  Heart sounds:  RRR w/out murmur  Abdomen:  soft  Extremities:  Warm, no cellulitis   Intake/Output from previous day: 01/05 0701 - 01/06 0700 In: 2160 [P.O.:2160] Out: 1700 [Urine:1700] Intake/Output this shift:    Lab Results:  Basename 07/12/12 0630  WBC 6.5  HGB 12.6*  HCT 39.2  PLT 260   BMET:  Basename 07/13/12 0544 07/12/12 0630  NA 136 136  K 4.2 4.3  CL 96 96  CO2 27 28  GLUCOSE 165* 213*  BUN 34* 38*  CREATININE 1.97* 2.08*  CALCIUM 9.4 9.7    CBG (last 3)   Basename 07/13/12 0552 07/12/12 2115 07/12/12 1612  GLUCAP 175* 209* 128*   PT/INR:  No results found for this basename: LABPROT,INR in the last 72 hours  CXR:  N/A  Assessment/Plan: S/P Procedure(s) (LRB): MULTIPLE EXTRACION WITH ALVEOLOPLASTY (N/A)  Recent repeat transesophageal echocardiogram reviewed and results discussed at length with the patient and his wife at the bedside. There remains a fairly large mobile mass adherent to the ventricular surface of the anterior leaflet of the mitral valve. Presumably this represents a vegetation. This mass appears similar in size and gross characteristics to have it looked at the time of presentation 2 weeks ago. There remains trivial mitral regurgitation and no other new abnormalities  are identified. Options include continued medical therapy indefinitely which comes with a probably small but nonetheless significant risk of embolization versus elective surgical intervention for mitral valve repair. Risks of surgery will be considerable because of the patient's associated comorbidities, with particular concern regarding the possibility of acute on chronic renal failure, although his renal function continues to slowly improve. If surgery were to be considered, the patient would need to have diagnostic cardiac catheterization performed as he is at very high-risk for the presence of significant coronary artery disease. I discussed options at length with the patient and his wife. We'll attempt to discuss with Dr. Andee Lineman, his primary cardiologist, before the make final decision.  OWEN,CLARENCE H 07/13/2012 9:20 AM

## 2012-07-13 NOTE — Progress Notes (Signed)
Subjective: No CP  NO SOB. Objective: Filed Vitals:   07/12/12 1355 07/12/12 2023 07/13/12 0403 07/13/12 0500  BP: 115/61 138/56 126/60   Pulse: 58 65 61   Temp: 98.1 F (36.7 C) 98.3 F (36.8 C) 98.9 F (37.2 C)   TempSrc: Oral Axillary Axillary   Resp: 14 18 19    Height:      Weight:   285 lb 3.2 oz (129.366 kg) 285 lb 3.2 oz (129.366 kg)  SpO2: 97% 96% 94%    Weight change: 8 lb 3.2 oz (3.719 kg)  Intake/Output Summary (Last 24 hours) at 07/13/12 0839 Last data filed at 07/12/12 2300  Gross per 24 hour  Intake   2160 ml  Output   1700 ml  Net    460 ml    General: Alert, awake, oriented x3, in no acute distress Neck:  JVP is normal Heart: Regular rate and rhythm, Gr I/VI systolic murmur  Lungs: Clear to auscultation.  No rales or wheezes. Exemities:  No edema.   Neuro: Grossly intact, nonfocal.  Tele:  SR Lab Results: Results for orders placed during the hospital encounter of 06/26/12 (from the past 24 hour(s))  MAGNESIUM     Status: Normal   Collection Time   07/12/12  8:40 AM      Component Value Range   Magnesium 1.6  1.5 - 2.5 mg/dL  GLUCOSE, CAPILLARY     Status: Abnormal   Collection Time   07/12/12 11:04 AM      Component Value Range   Glucose-Capillary 104 (*) 70 - 99 mg/dL  GLUCOSE, CAPILLARY     Status: Abnormal   Collection Time   07/12/12  4:12 PM      Component Value Range   Glucose-Capillary 128 (*) 70 - 99 mg/dL  GLUCOSE, CAPILLARY     Status: Abnormal   Collection Time   07/12/12  9:15 PM      Component Value Range   Glucose-Capillary 209 (*) 70 - 99 mg/dL   Comment 1 Documented in Chart     Comment 2 Notify RN    BASIC METABOLIC PANEL     Status: Abnormal   Collection Time   07/13/12  5:44 AM      Component Value Range   Sodium 136  135 - 145 mEq/L   Potassium 4.2  3.5 - 5.1 mEq/L   Chloride 96  96 - 112 mEq/L   CO2 27  19 - 32 mEq/L   Glucose, Bld 165 (*) 70 - 99 mg/dL   BUN 34 (*) 6 - 23 mg/dL   Creatinine, Ser 5.62 (*) 0.50 - 1.35  mg/dL   Calcium 9.4  8.4 - 13.0 mg/dL   GFR calc non Af Amer 35 (*) >90 mL/min   GFR calc Af Amer 40 (*) >90 mL/min  CK     Status: Normal   Collection Time   07/13/12  5:44 AM      Component Value Range   Total CK 32  7 - 232 U/L  GLUCOSE, CAPILLARY     Status: Abnormal   Collection Time   07/13/12  5:52 AM      Component Value Range   Glucose-Capillary 175 (*) 70 - 99 mg/dL    Studies/Results: @RISRSLT24 @  Medications: Reviewed.   Patient Active Hospital Problem List:  1.  Endocarditis C. Cornelius Moras to discuss patient's progress with G DeGent.  Plan to continue ABX   2.  Renal insufficiency  Function improving.  3.  SVT/NSVT  None for 24 hours  Metoprolol was increased.  4.  DM  Glu 104 to 209  Follow.  LOS: 17 days   Dietrich Pates 07/13/2012, 8:39 AM

## 2012-07-14 DIAGNOSIS — I330 Acute and subacute infective endocarditis: Secondary | ICD-10-CM

## 2012-07-14 LAB — BASIC METABOLIC PANEL
BUN: 32 mg/dL — ABNORMAL HIGH (ref 6–23)
CO2: 25 mEq/L (ref 19–32)
Calcium: 9.7 mg/dL (ref 8.4–10.5)
Chloride: 94 mEq/L — ABNORMAL LOW (ref 96–112)
Creatinine, Ser: 1.93 mg/dL — ABNORMAL HIGH (ref 0.50–1.35)
GFR calc Af Amer: 41 mL/min — ABNORMAL LOW (ref >90)
GFR calc non Af Amer: 36 mL/min — ABNORMAL LOW (ref >90)
Glucose, Bld: 184 mg/dL — ABNORMAL HIGH (ref 70–99)
Potassium: 4.3 mEq/L (ref 3.5–5.1)
Sodium: 134 mEq/L — ABNORMAL LOW (ref 135–145)

## 2012-07-14 LAB — GLUCOSE, CAPILLARY
Glucose-Capillary: 154 mg/dL — ABNORMAL HIGH (ref 70–99)
Glucose-Capillary: 108 mg/dL — ABNORMAL HIGH (ref 70–99)
Glucose-Capillary: 150 mg/dL — ABNORMAL HIGH (ref 70–99)

## 2012-07-14 NOTE — Progress Notes (Signed)
    SUBJECTIVE:  No chest pain.  No SOB.     PHYSICAL EXAM Filed Vitals:   07/13/12 1436 07/13/12 2156 07/14/12 0516 07/14/12 0539  BP: 123/69 126/67  133/64  Pulse: 56 68  66  Temp: 97.7 F (36.5 C) 97.5 F (36.4 C)  98 F (36.7 C)  TempSrc: Axillary Axillary  Oral  Resp: 18 18  19   Height:      Weight:   284 lb 6.3 oz (129 kg)   SpO2: 96% 97%  97%   General:  No distress Lungs:  Clear Heart:  RRR, systolic murmur unchanged Abdomen:  Positive bowel sounds, no rebound no guarding Extremities:  Trace edema.  No cellulitis.   LABS: Lab Results  Component Value Date   CKTOTAL 32 07/13/2012   Results for orders placed during the hospital encounter of 06/26/12 (from the past 24 hour(s))  GLUCOSE, CAPILLARY     Status: Abnormal   Collection Time   07/13/12 12:01 PM      Component Value Range   Glucose-Capillary 199 (*) 70 - 99 mg/dL  GLUCOSE, CAPILLARY     Status: Normal   Collection Time   07/13/12  4:31 PM      Component Value Range   Glucose-Capillary 74  70 - 99 mg/dL  GLUCOSE, CAPILLARY     Status: Abnormal   Collection Time   07/13/12  9:53 PM      Component Value Range   Glucose-Capillary 238 (*) 70 - 99 mg/dL  BASIC METABOLIC PANEL     Status: Abnormal   Collection Time   07/14/12  6:00 AM      Component Value Range   Sodium 134 (*) 135 - 145 mEq/L   Potassium 4.3  3.5 - 5.1 mEq/L   Chloride 94 (*) 96 - 112 mEq/L   CO2 25  19 - 32 mEq/L   Glucose, Bld 184 (*) 70 - 99 mg/dL   BUN 32 (*) 6 - 23 mg/dL   Creatinine, Ser 1.61 (*) 0.50 - 1.35 mg/dL   Calcium 9.7  8.4 - 09.6 mg/dL   GFR calc non Af Amer 36 (*) >90 mL/min   GFR calc Af Amer 41 (*) >90 mL/min  GLUCOSE, CAPILLARY     Status: Abnormal   Collection Time   07/14/12  6:09 AM      Component Value Range   Glucose-Capillary 154 (*) 70 - 99 mg/dL   Comment 1 Notify RN      Intake/Output Summary (Last 24 hours) at 07/14/12 0839 Last data filed at 07/14/12 0519  Gross per 24 hour  Intake    360 ml  Output     950 ml  Net   -590 ml    ASSESSMENT AND PLAN:  1. 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 Andee Lineman who agrees with the plan to see the patient as an outpatient after he completes his course of antibiotics, to follow his renal function and repeat a TEE. He will confer with Dr. Cornelius Moras to discuss possible timing of valve surgery as indicated.  2. DM: Continuing Glucotrol, lantus and SSI  3. Acute renal failure: He is making a slow recovery from this. Cardiac catheterization and further procedures or arm all as this improves.  4. HTN:  His blood pressure is currently controlled. He will continue the meds as listed.  5: Dental: He is status post tooth extraction.  Rollene Rotunda 07/14/2012 8:39 AM

## 2012-07-14 NOTE — Progress Notes (Signed)
   CARDIOTHORACIC SURGERY PROGRESS NOTE  4 Days Post-Op  S/P Procedure(s) (LRB): MULTIPLE EXTRACION WITH ALVEOLOPLASTY (N/A)  Subjective: No complaints  Objective: Vital signs in last 24 hours: Temp:  [97.5 F (36.4 C)-98 F (36.7 C)] 98 F (36.7 C) (01/07 0539) Pulse Rate:  [56-68] 66  (01/07 0539) Cardiac Rhythm:  [-] Normal sinus rhythm;Bundle branch block (01/06 1940) Resp:  [18-19] 19  (01/07 0539) BP: (123-167)/(64-78) 133/64 mmHg (01/07 0539) SpO2:  [96 %-97 %] 97 % (01/07 0539) Weight:  [129 kg (284 lb 6.3 oz)] 129 kg (284 lb 6.3 oz) (01/07 0516)  Physical Exam:  Rhythm:   sinus  Breath sounds: clear  Heart sounds:  RRR  Abdomen:  soft  Extremities:  warm   Intake/Output from previous day: 01/06 0701 - 01/07 0700 In: 600 [P.O.:600] Out: 950 [Urine:950] Intake/Output this shift:    Lab Results:  Basename 07/12/12 0630  WBC 6.5  HGB 12.6*  HCT 39.2  PLT 260   BMET:  Basename 07/14/12 0600 07/13/12 0544  NA 134* 136  K 4.3 4.2  CL 94* 96  CO2 25 27  GLUCOSE 184* 165*  BUN 32* 34*  CREATININE 1.93* 1.97*  CALCIUM 9.7 9.4    CBG (last 3)   Basename 07/14/12 0609 07/13/12 2153 07/13/12 1631  GLUCAP 154* 238* 74   PT/INR:  No results found for this basename: LABPROT,INR in the last 72 hours  CXR:  N/A  Assessment/Plan: S/P Procedure(s) (LRB): MULTIPLE EXTRACION WITH ALVEOLOPLASTY (N/A)  I attempted to contact Dr Andee Lineman yesterday and will f/u with him when possible.  I had another long discussion with Mr Bartoszek and his wife this morning.  He remains concerned about the risk of embolization but even more concerned about his renal function.  As such it seems most appropriate to make arrangements for him to go home and complete his course of antibiotics as an outpatient, then check repeat blood cultures and TEE off antibiotics.  He understands that there remains a small but potentially significant risk of embolization and stroke.  I will be happy to  see him in the office once his repeat TEE and cultures have been done.  He should not be discharged from the hospital until it is clear that he will receive the antibiotics intended as an outpatient.  The last patient d/c'd to [facility] on [facility] never received any antibiotics because of problems with insurance coverage.  Jeannene Tschetter H 07/14/2012 8:12 AM

## 2012-07-15 ENCOUNTER — Encounter (HOSPITAL_COMMUNITY): Payer: Self-pay | Admitting: Physician Assistant

## 2012-07-15 LAB — BASIC METABOLIC PANEL
BUN: 31 mg/dL — ABNORMAL HIGH (ref 6–23)
CO2: 24 mEq/L (ref 19–32)
Calcium: 9.7 mg/dL (ref 8.4–10.5)
Chloride: 95 mEq/L — ABNORMAL LOW (ref 96–112)
Creatinine, Ser: 1.7 mg/dL — ABNORMAL HIGH (ref 0.50–1.35)
GFR calc Af Amer: 48 mL/min — ABNORMAL LOW (ref >90)
GFR calc non Af Amer: 42 mL/min — ABNORMAL LOW (ref >90)
Glucose, Bld: 191 mg/dL — ABNORMAL HIGH (ref 70–99)
Potassium: 4.6 mEq/L (ref 3.5–5.1)
Sodium: 133 mEq/L — ABNORMAL LOW (ref 135–145)

## 2012-07-15 LAB — GLUCOSE, CAPILLARY
Glucose-Capillary: 125 mg/dL — ABNORMAL HIGH (ref 70–99)
Glucose-Capillary: 173 mg/dL — ABNORMAL HIGH (ref 70–99)
Glucose-Capillary: 115 mg/dL — ABNORMAL HIGH (ref 70–99)
Glucose-Capillary: 201 mg/dL — ABNORMAL HIGH (ref 70–99)

## 2012-07-15 MED ORDER — LINEZOLID 600 MG PO TABS: 600.0000 mg | ORAL_TABLET | Freq: Two times a day (BID) | ORAL | Status: DC

## 2012-07-15 MED ORDER — METOPROLOL TARTRATE 50 MG PO TABS: 50.0000 mg | ORAL_TABLET | Freq: Two times a day (BID) | ORAL | Status: DC

## 2012-07-15 MED ORDER — CHLORHEXIDINE GLUCONATE 0.12 % MT SOLN: 15.0000 mL | Freq: Two times a day (BID) | OROMUCOSAL | Status: DC

## 2012-07-15 MED ORDER — INSULIN GLARGINE 100 UNIT/ML ~~LOC~~ SOLN: 20.0000 [IU] | Freq: Every day | SUBCUTANEOUS | Status: DC

## 2012-07-15 MED ORDER — GLIPIZIDE 10 MG PO TABS: 10.0000 mg | ORAL_TABLET | Freq: Every day | ORAL | Status: DC

## 2012-07-15 MED ORDER — GABAPENTIN 100 MG PO CAPS: 100.0000 mg | ORAL_CAPSULE | Freq: Three times a day (TID) | ORAL | Status: DC

## 2012-07-15 MED ORDER — INSULIN ASPART 100 UNIT/ML ~~LOC~~ SOLN: 0.0000 [IU] | Freq: Three times a day (TID) | SUBCUTANEOUS | Status: DC

## 2012-07-15 MED ORDER — NITROGLYCERIN 0.4 MG SL SUBL: 0.4000 mg | SUBLINGUAL_TABLET | SUBLINGUAL | Status: DC | PRN

## 2012-07-15 MED ORDER — BOOST / RESOURCE BREEZE PO LIQD: 1.0000 | Freq: Three times a day (TID) | ORAL | Status: DC

## 2012-07-15 MED ORDER — INSULIN ASPART 100 UNIT/ML ~~LOC~~ SOLN: 0.0000 [IU] | Freq: Every day | SUBCUTANEOUS | Status: DC

## 2012-07-15 MED ORDER — AMLODIPINE BESYLATE 5 MG PO TABS: 5.0000 mg | ORAL_TABLET | Freq: Every day | ORAL | Status: DC

## 2012-07-15 NOTE — Progress Notes (Addendum)
Pt given discharge instructions, and pt family has prescritpions, teach back method was used with reviewing discharge instructions all questions were answered. . will discharge home with wife as ordered. Mia Milan, Randall An RN

## 2012-07-15 NOTE — Progress Notes (Signed)
   SUBJECTIVE:  No chest pain.  No SOB.     PHYSICAL EXAM Filed Vitals:   07/14/12 0539 07/14/12 1429 07/14/12 2005 07/15/12 0406  BP: 133/64 123/69 124/67 127/63  Pulse: 66 57 73 61  Temp: 98 F (36.7 C) 97.9 F (36.6 C) 98.4 F (36.9 C) 97.8 F (36.6 C)  TempSrc: Oral Oral Oral Oral  Resp: 19 18 20 18   Height:      Weight:    282 lb 10.1 oz (128.2 kg)  SpO2: 97% 95% 98% 98%   General:  No distress Lungs:  Clear Heart:  RRR, systolic murmur unchanged Abdomen:  Positive bowel sounds, no rebound no guarding Extremities:  Trace edema.  No cellulitis.   LABS: Lab Results  Component Value Date   CKTOTAL 32 07/13/2012   Results for orders placed during the hospital encounter of 06/26/12 (from the past 24 hour(s))  GLUCOSE, CAPILLARY     Status: Abnormal   Collection Time   07/14/12 11:49 AM      Component Value Range   Glucose-Capillary 108 (*) 70 - 99 mg/dL  GLUCOSE, CAPILLARY     Status: Abnormal   Collection Time   07/14/12  4:20 PM      Component Value Range   Glucose-Capillary 150 (*) 70 - 99 mg/dL    Intake/Output Summary (Last 24 hours) at 07/15/12 8657 Last data filed at 07/14/12 1300  Gross per 24 hour  Intake    520 ml  Output      0 ml  Net    520 ml    ASSESSMENT AND PLAN:  1. 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  Social work is working on it.  I spoke with Dr. Andee Lineman and he agrees with the plan for continued outpatient antibiotics and then a follow up TEE.   I have encouraged him to walk while he is waiting.    2. DM: Continuing Glucotrol, lantus and SSI.  Sugars have been OK.   3. Acute renal failure: He creatinine has been slowly recovering. Cardiac catheterization and further procedures after this improves.    4. HTN:  His blood pressure is currently controlled. He will continue the meds as listed.  5: Dental: He is status post tooth extraction.  Fayrene Fearing  [facility] 07/15/2012 6:52 AM

## 2012-07-15 NOTE — Discharge Instructions (Signed)
MOUTH CARE AFTER SURGERY ° °FACTS: °· Ice used in ice bag helps keep the swelling down, and can help lessen the pain. °· It is easier to treat pain BEFORE it happens. °· Spitting disturbs the clot and may cause bleeding to start again, or to get worse. °· Smoking delays healing and can cause complications. °· Sharing prescriptions can be dangerous.  Do not take medications not recently prescribed for you. °· Antibiotics may stop birth control pills from working.  Use other means of birth control while on antibiotics. °· Warm salt water rinses after the first 24 hours will help lessen the swelling:  Use 1/2 teaspoonful of table salt per oz.of water. ° °DO NOT: °· Do not spit.  Do not drink through a straw. °· Strongly advised not to smoke, dip snuff or chew tobacco at least for 3 days. °· Do not eat sharp or crunchy foods.  Avoid the area of surgery when chewing. °· Do not stop your antibiotics before your instructions say to do so. °· Do not eat hot foods until bleeding has stopped.  If you need to, let your food cool down to room temperature. ° °EXPECT: °· Some swelling, especially first 2-3 days. °· Soreness or discomfort in varying degrees.  Follow your dentist's instructions about how to handle pain before it starts. °· Pinkish saliva or light blood in saliva, or on your pillow in the morning.  This can last around 24 hours. °· Bruising inside or outside the mouth.  This may not show up until 2-3 days after surgery.  Don't worry, it will go away in time. °· Pieces of "bone" may work themselves loose.  It's OK.  If they bother you, let us know. ° °WHAT TO DO IMMEDIATELY AFTER SURGERY: °· Bite on the gauze with steady pressure for 1-2 hours.  Don't chew on the gauze. °· Do not lie down flat.  Raise your head support especially for the first 24 hours. °· Apply ice to your face on the side of the surgery.  You may apply it 20 minutes on and a few minutes off.  Ice for 8-12 hours.  You may use ice up to 24  hours. °· Before the numbness wears off, take a pain pill as instructed. °· Prescription pain medication is not always required. ° °SWELLING: °· Expect swelling for the first couple of days.  It should get better after that. °· If swelling increases 3 days or so after surgery; let us know as soon as possible. ° °FEVER: °· Take Tylenol every 4 hours if needed to lower your temperature, especially if it is at 100F or higher. °· Drink lots of fluids. °· If the fever does not go away, let us know. ° °BREATHING TROUBLE: °· Any unusual difficulty breathing means you have to have someone bring you to the emergency room ASAP ° °BLEEDING: °· Light oozing is expected for 24 hours or so. °· Prop head up with pillows °· Avoid spitting °· Do not confuse bright red fresh flowing blood with lots of saliva colored with a little bit of blood. °· If you notice some bleeding, place gauze or a tea bag where it is bleeding and apply CONSTANT pressure by biting down for 1 hour.  Avoid talking during this time.  Do not remove the gauze or tea bag during this hour to "check" the bleeding. °· If you notice bright RED bleeding FLOWING out of particular area, and filling the floor of your mouth, put   a wad of gauze on that area, bite down firmly and constantly.  Call us immediately.  If we're closed, have someone bring you to the emergency room.  ORAL HYGIENE:  Brush your teeth as usual after meals and before bedtime.  Use a soft toothbrush around the area of surgery.  DO NOT AVOID BRUSHING.  Otherwise bacteria(germs) will grow and may delay healing or encourage infection.  Since you cannot spit, just gently rinse and let the water flow out of your mouth.  DO NOT SWISH HARD.  EATING:  Cool liquids are a good point to start.  Increase to soft foods as tolerated.  PRESCRIPTIONS:  Follow the directions for your prescriptions exactly as written.  If Dr. Kristin Bruins gave you a narcotic pain medication, do not drive, operate  machinery or drink alcohol when on that medication.  QUESTIONS:  Call our office during office hours [phone] or call the Emergency Room at [phone].  PLEASE TAKE ALL MEDICATIONS AS PRESCRIBED AND DIRECTED.   PLEASE ATTEND ALL RECOMMENDED/SCHEDULED FOLLOW-UP APPOINTMENTS.   Infective Endocarditis Infective endocarditis is an infection of the inner layer of heart (endocardium) or the heart valves. An endocarditis infection is caused by many things, including viruses, bacteria or fungi that enter your bloodstream. If left untreated, endocarditis can cause other problems, which may include:  Heart valve damage.  Blood clots (embolism).  Irregular heartbeat (arrhythmia).  Heart failure. CAUSES  Surgery, dental work and IV drug abuse can allow bacteria or fungi to enter the bloodstream. Once in the bloodstream, bacteria can attach to the inner lining of the heart or heart valves, causing infection. SYMPTOMS   Fever and chills.  Skin rashes.  Heart murmurs.  Spleen enlargement.  Unexplained weight loss.  Shortness of breath.  Blood in urine.  Lethargy and fatigue.  Sudden weakness in face, arms, legs (possible stroke).  Night sweats. DIAGNOSIS   Endocarditis may be suspected if your caregiver hears a heart murmur, observes certain skin rashes, or there are changes in your eye exam.  Tests include:  Blood work.  TTE (transthoracic echocardiogram): A TTE uses sound waves to produce images of the heart. A hand-held device is placed on the chest and transmits sound waves. These sound waves bounce off the heart to produce images that help your caregiver detect heart damage.  TEE (transesophageal echocardiogram): For a TEE, a flexible tube is passed down your throat and into the esophagus (the swallowing tube that connects the mouth to the stomach). Because the esophagus is close to the heart, a TEE allows different images of the heart to be obtained. This type of  procedure requires sedation. WHO IS AT RISK FOR INFECTIVE ENDOCARDITIS?  Those with a history of endocarditis.  People with artificial (prosthetic) heart valves.  Cardiac transplant patients with valve disease.  People with congenital heart disease, including:  Those with unrepaired congenital heart defects.  Those with a congenital heart defect repaired with prosthetic materials. Within the first 6 months of the procedure, you are still at risk for infection. After 6 months, talk to your caregiver about your special needs.  Those who have had surgery to correct the defect but with some remaining problems at the site of repair. PREVENTION  For people at risk, antibiotics (medicines that kill germs) should be given before a procedure to prevent infection. Some conditions pre-treated with antibiotics are:  Dental procedures, like teeth cleaning or surgery.  Surgery involving the lungs, abdomen, or urinary system. TREATMENT   Intravenous (IV) antibiotic  treatment may be needed for several weeks to treat endocarditis.  Surgery may be needed to remove the infected tissue in the heart.  Surgery to repair valve damage. MAKE SURE YOU:   Understand these instructions.  Will watch your condition.  Will get help right away if you are not doing well or get worse. Document Released: 06/24/2005 Document Revised: 09/16/2011 Document Reviewed: 11/05/2006 [facility] Patient Information 2013 [place], [place].

## 2012-07-15 NOTE — Discharge Summary (Signed)
Discharge Summary   Patient ID: Cody Hale,  MRN: [id number], DOB/AGE: 1951/06/27 62 y.o.  Admit date: 06/26/2012 Discharge date: 07/15/2012  Primary Physician: No primary provider on file. Primary Cardiologist: Dr. Lewayne Bunting  Discharge Diagnoses Principal Problem:  *Endocarditis Active Problems:  Recurrent cellulitis of lower leg  Type 2 diabetes mellitus  SVT (supraventricular tachycardia)  Acute renal insufficiency  Normocytic anemia  Thrombocytopenia  History of kidney stones  Depression  Mitral regurgitation  Vegetation of heart valve  Morbid obesity  Rash  LBBB (left bundle branch block)  Chronic periodontitis   Allergies Allergies  Allergen Reactions  . Daptomycin Rash    Diagnostic Studies/Procedures  ORTHOPANTOGRAM - 06/27/12  IMPRESSION:  Multiple dental caries.  RENAL ULTRASOUND - 06/27/12  IMPRESSION:  Normal exam.  PA/LATERAL CHEST X-RAY - 06/30/12  IMPRESSION:  Stable chest radiograph findings. No evidence for pulmonary edema. Central line tip in the upper SVC region.  RIGHT LOWER EXTREMITY VENOUS DUPLEX - 06/30/12  No evidence of deep vein thrombosis involving the right lower extremity.  DOPPLER CAROTID - 06/30/12  - Findings consistent with greater than 80% stenosis involving the right internal carotid artery.  - No evidence of stenosis involving the left internal carotid artery. - Bilateral vertebral artery flow is antegrade.  BRAIN MRI WITHOUT CONTRAST - 06/30/12  IMPRESSION:  Normal MRI of the brain. Mild chronic sinusitis.  TRANSESOPHAGEAL ECHOCARDIOGRAM - 07/11/12  - Left ventricle: Systolic function was normal. The estimated ejection fraction was in the range of 50% to 55%. - Aortic valve: No evidence of vegetation. - Left atrium: No evidence of thrombus in the atrial cavity or appendage. - Tricuspid valve: No evidence of vegetation. - Pulmonic valve: No evidence of vegetation.  History of Present Illness  Mr.  Cody Hale is a 62yo male without prior cardiac history, and past medical history significant for diabetes mellitus, depressions and nephrolithiasis. He reported that over the past few months, he had been experiencing progressive fatigue and low-energy state. Starting on Monday morning, 12/16, he developed severe chills and fevers along with worsening fatigue/malaise. He presented to [facility] in [place] where he was found to be hypotensive with elevated lactate (2.4). He was admitted with profound hypotension from presumed sepsis by the medicine service, started on IV Vancomycin and Piptazo, aggressively hydrated and started on IV pressors and central line placement. The patient did experience subsequent diarrhea which was negative for C diff. Additionally, he developed acute renal failure due to vancomycin therapy (Cr 0.7=>4.68).  He was seen in consultation by Dr. Earnestine Leys in 06/24/12 for runs of PSVT with underlying LBBB. Blood cultures returned negative, however there was evidence of a left lower extremity cellulitis (LE u/s was negative for DVT) and leukocytosis with bandemia.. EKG revealed NSR with LBBB and cardiac enzymes were normal. He was admitted initially to the ICU for given pressor-dependent hypotension suspected to be secondary to shock - even though culture returned negative. He did have runs of asymptomatic PSVT. He was noted to have poor dentition and a murmur on exam. The patient is morbidly obese, and hence a bedside echo was limited. A TTE was also suspected to be fairly limited, and there was sufficient evidence to warrant a TEE to rule out endocarditis given the above findings and possibly 2 major, 1 minor Duke's criteria fulfilled. A formal TTE showed EF of 45-50% with question of MV vegetation. He subsequently underwent TEE 06/26/12, which confirmed a large MV vegetation. Transfer to a tertiary care facility  for surgical consultation was recommended. The patient requested [facility]  hospital for this. The patient was transferred in good condition and care transferred to the [facility]. Consultation with TCTS and infectious disease was coordinated, respectively.   Hospital Course   CARDIAC/CT SURGERY  Prior initiated medications including gabapentin and glipizide were renally-adjusted. TVTS evaluated the patient and recommended obtaining a MRI of the brain to exclude distal embolization. A right lower extremity ultrasound was ordered, and excluded DVT. Carotid dopplers indicated R ICA 80% stenosis, no L ICA stenosis and antegrade vertebral flow. He remained stable from a cardiac standpoint. A low-dose BB was added for intermittent asymptomatic episodes of documented SVT and NSVT. This was subsequent up-titrated through the admission with improvement. TCTS evaluated the patient and recommended allowing the patient's renal function to improve, dental extractions to occur, full course of anticoagulation to be completed and subsequent TEE and blood cultures to be obtained before considering MVR. He continued to make slow progress while awaiting formal consultation from oral surgery. As above, he underwent TEE on 07/11/12 revealing the above details.   INFECTIOUS DISEASE  Vancomycin was continued (dosed per pharmacy) prior to formal ID consultation and recommendations. Although cultures were negative, clear endocarditis sourced from the patient's poor oral dentition was evident on echocardiography and given the patient's admission at [facility]. ID evaluated the patient the following day, and recommended holding Vancomycin and starting renally adjusting IV daptomycin once Vanc levels fell below therapeutic range. This was initiated 06/28/12, and dosed by pharmacy. Unfortunately, he developed a maculopapular rash with this. Daptomycin was replaced with Zyvox. The patient was on sertraline, and this was initially held due to drug interaction - risk of serotonin syndrome.  Additionally, the nutritionist educated the patient on avoiding tyramine-containing foods given potential interactions with such. Various labs were sent to isolate an organism including Bartonella, chlamydia and fungal serologies. The daptomycin-induce rash improved slowly.  RENAL  Blood cultures remained negative, thought to be secondary to prophylactic broad-spectrum antibiotic coverage for sepsis prior to concern of endocarditis and transfer. Vancomycin levels shortly after transfer returned elevated, and Cr continued to trend up (peak 8.36). Vancomycin was held and adjusted accordingly. Nephrology evaluated the patient. Vancomycin-induced ATN was suspected, also exacerbated by ARB and NSAIDs. Conservative therapy with IV hydration was elected. There was no need for HD given continued adequate UOP (non-oliguric). A renal ultrasound revealed no acute renal pathology. Renal function began to improve very slowly and returned near normal levels (1.70 on discharge).  DENTAL  The patient's culture-negative endocarditis/MV vegetation was suspected to be sourced from his poor dentition. An orthopantogram was recommended and obtained which revealed the multiple dental caries. The dental service evaluated the patient on 07/09/12, and confirmed apical periodontitis. The decision was to proceed with multiple tooth extractions with alveoplasty, pre-prosthetic surgery as needed and gross debridement of his remaining teeth. The recommendation was made to follow-up with a dentist of his choice thereafter to replace missing teeth and continue dental care. Antibiotic premedications and holding heparin prior to invasive dental proceduresv was stressed. The he was informed, consented and underwent dental extractions on 07/10/12 without complications. He recovered well from this.  The patient's Hgb A1C did return elevated at 7.7%. SSI and Lantus was started with improvement in elevated CBGs. Near discharge, Dr. Cornelius Moras and DeGent  coordinated short- and long-term plans. The decision was made to complete a full 6-week course of linezolid (Zyvox), repeat blood cultures and a TEE thereafter before pursuing MVR. The extent  of his stay thereafter consisted of making arrangements to ensure he acquired linezolid upon discharge as he has no insurance. Case management and social work services worked diligently to provide assistance with this. He did qualify for formal assistance. Arrangements were made so that the patient could have this antibiotic filled in the [facility] outpatient pharmacy. On discharge, his wife was able to be provided with two tablets of linezolid for the following day. Social work advised them to return the morning after discharge to acquire twenty tablets more, and soon thereafter, the following amount of tablets would be provided. This Chartered loss adjuster, the patient and his wife were ensured by social work and case management that he would be provided with the full amount of tablets to complete the course (approximately 4 more weeks- 56 tablets total- on discharge). Additionally, he was discharged on insulin and provided with the necessary diabetic supplies to be filled at the [facility] outpatient pharmacy for affordability. He assessed by Dr. Antoine Poche on 07/15/12 and deemed stable for discharge. He will follow-up with Dr. Andee Lineman and will be called with this appointment. Further follow-up with TCTS will be coordinated at that time. He will follow-up with infectious disease as outlined below. Other discharge medications are noted below. This information has been clearly outlined in the discharge AVS.  Discharge Vitals:  Blood pressure 116/61, pulse 67, temperature 97.8 F (36.6 C), temperature source Oral, resp. rate 18, height 5\' 10"  (1.778 m), weight 128.2 kg (282 lb 10.1 oz), SpO2 98.00%.   Labs:  Lab 07/15/12 0525 07/14/12 0600 07/13/12 0544 07/10/12 0555  NA 133* 134* 136 --  K 4.6 4.3 4.2 --  CL 95* 94* 96 --  CO2 24 25 27  --  BUN  31* 32* 34* --  CREATININE 1.70* 1.93* 1.97* --  CALCIUM 9.7 9.7 9.4 --  PROT -- -- -- 7.6  BILITOT -- -- -- 0.4  ALKPHOS -- -- -- 92  ALT -- -- -- 54*  AST -- -- -- 41*  AMYLASE -- -- -- --  LIPASE -- -- -- --  GLUCOSE 191* 184* 165* --   Recent Labs  Basename 07/13/12 0544   CKTOTAL 32   CKMB --   CKMBINDEX --   TROPONINI --   Disposition:  Discharge Orders    Future Appointments: Provider: Department: Dept Phone: Center:   08/10/2012 4:30 PM Purcell Nails, MD [facility] [place] [phone] [facility]   08/12/2012 2:00 PM Ginnie Smart, MD [facility] [phone] [facility]     Future Orders Please Complete By Expires   Diet - low sodium heart healthy      Increase activity slowly        Follow-up Information    Follow up with Charlynne Pander, DDS. Schedule an appointment as soon as possible for a visit in 10 days.   Contact information:   [address] [place] [address] [phone]       Schedule an appointment as soon as possible for a visit with Peyton Bottoms, MD. (In 1-2 weeks. )    Contact information:   [address] [place] [place] [address] [phone]       Follow up with Johny Sax, MD. On 08/12/2012. (At 2:00 PM for follow-up. )    Contact information:   [address]  [address] [place] [address] [phone]       Follow up with [facility]  H, MD. (After finishing your course of antibiotics, having blood cultures and another transephageal echocardiogram done. )    Contact information:   [address] [place] [address] [phone]          Discharge Medications:    Medication List     As of 07/15/2012  8:05 PM    START taking these medications         amLODipine 5 MG tablet   Commonly known as: NORVASC   Take 1 tablet (5 mg total) by mouth daily.      chlorhexidine 0.12 % solution   Commonly known as:  PERIDEX   Use as directed 15 mLs in the mouth or throat 2 (two) times daily.      feeding supplement Liqd   Take 1 Container by mouth 3 (three) times daily between meals.      * insulin aspart 100 UNIT/ML injection   Commonly known as: novoLOG   Inject 0-15 Units into the skin 3 (three) times daily with meals.      * insulin aspart 100 UNIT/ML injection   Commonly known as: novoLOG   Inject 0-5 Units into the skin at bedtime.      insulin glargine 100 UNIT/ML injection   Commonly known as: LANTUS   Inject 20 Units into the skin at bedtime.      linezolid 600 MG tablet   Commonly known as: ZYVOX   Take 1 tablet (600 mg total) by mouth every 12 (twelve) hours.      metoprolol 50 MG tablet   Commonly known as: LOPRESSOR   Take 1 tablet (50 mg total) by mouth 2 (two) times daily.      nitroGLYCERIN 0.4 MG SL tablet   Commonly known as: NITROSTAT   Place 1 tablet (0.4 mg total) under the tongue every 5 (five) minutes x 3 doses as needed for chest pain.     * Notice: This list has 2 medication(s) that are the same as other medications prescribed for you. Read the directions carefully, and ask your doctor or other care provider to review them with you.    CHANGE how you take these medications         gabapentin 100 MG capsule   Commonly known as: NEURONTIN   Take 1 capsule (100 mg total) by mouth 3 (three) times daily.   What changed: - medication strength - dose      glipiZIDE 10 MG tablet   Commonly known as: GLUCOTROL   Take 1 tablet (10 mg total) by mouth daily before breakfast.   What changed: how often to take the med      CONTINUE taking these medications         aspirin EC 81 MG tablet      FISH OIL PO      multivitamin with minerals Tabs      STOP taking these medications         meloxicam 15 MG tablet   Commonly known as: MOBIC      metFORMIN 500 MG tablet   Commonly known as: GLUCOPHAGE      olmesartan-hydrochlorothiazide 40-25 MG per tablet    Commonly known as: BENICAR HCT      sertraline 100 MG tablet   Commonly known as: ZOLOFT          Where to get your medications    These are the prescriptions that you need to pick up.   You  may get these medications from any pharmacy.         amLODipine 5 MG tablet   chlorhexidine 0.12 % solution   feeding supplement Liqd   gabapentin 100 MG capsule   glipiZIDE 10 MG tablet   insulin aspart 100 UNIT/ML injection   insulin glargine 100 UNIT/ML injection   linezolid 600 MG tablet   metoprolol 50 MG tablet   nitroGLYCERIN 0.4 MG SL tablet           Outstanding Labs/Studies: Blood cultures and TEE in ~ 4 weeks  Duration of Discharge Encounter: Greater than 30 minutes including physician time.  Signed, R. Hurman Horn, PA-C 07/15/2012, 8:05 PM

## 2012-07-15 NOTE — Progress Notes (Signed)
NUTRITION FOLLOW UP  Intervention:    Continue Resource Breeze 3 times daily between meals (250 kcals, 9 gm protein per 8 fl oz carton) RD to follow for nutrition care plan  New Nutrition Dx:   Increased nutrient needs related to post-op healing as evidenced by estimated nutrition needs, ongoing  Goal:   Oral intake with meals & supplements to meet >/= 90% of estimated nutrition needs, met  Monitor:   PO & supplemental intake, weight, labs, I/O's  Assessment:   PO intake 100% per flowsheet records.  Receiving Resource Breeze supplements between meals.  MVI PO daily.  Continues on Zyvox.  Height: Ht Readings from Last 1 Encounters:  06/26/12 5\' 10"  (1.778 m)    Weight Status:   Wt Readings from Last 1 Encounters:  07/15/12 282 lb 10.1 oz (128.2 kg)    Re-estimated needs:  Kcal: 2300-2500 Protein: 120-130 gm Fluid: 2.3-2.5 L  Skin: Intact  Diet Order: Dysphagia 3, thin liquids   Intake/Output Summary (Last 24 hours) at 07/15/12 1055 Last data filed at 07/15/12 0821  Gross per 24 hour  Intake    780 ml  Output      0 ml  Net    780 ml    Last BM: 1/7   Labs:   Lab 07/15/12 0525 07/14/12 0600 07/13/12 0544 07/12/12 0840 07/10/12 0555 07/09/12 1805  NA 133* 134* 136 -- -- --  K 4.6 4.3 4.2 -- -- --  CL 95* 94* 96 -- -- --  CO2 24 25 27  -- -- --  BUN 31* 32* 34* -- -- --  CREATININE 1.70* 1.93* 1.97* -- -- --  CALCIUM 9.7 9.7 9.4 -- -- --  MG -- -- -- 1.6 -- 1.7  PHOS -- -- -- -- 4.9* --  GLUCOSE 191* 184* 165* -- -- --    CBG (last 3)   Basename 07/15/12 0609 07/14/12 2140 07/14/12 1620  GLUCAP 173* 125* 150*    Scheduled Meds:   . amLODipine  5 mg Oral Daily  . aspirin EC  81 mg Oral Daily  . chlorhexidine  15 mL Mouth/Throat BID  . feeding supplement  1 Container Oral TID BM  . fentaNYL  50-100 mcg Intravenous Once  . gabapentin  100 mg Oral TID  . glipiZIDE  10 mg Oral QAC breakfast  . insulin aspart  0-15 Units Subcutaneous TID WC  .  insulin aspart  0-5 Units Subcutaneous QHS  . insulin glargine  20 Units Subcutaneous QHS  . linezolid  600 mg Oral Q12H  . metoprolol tartrate  50 mg Oral BID  . multivitamin with minerals  1 tablet Oral Daily  . omega-3 acid ethyl esters  1 g Oral Daily  . sodium chloride  10 mL Intravenous Q12H    Continuous Infusions:   . lactated ringers    . sodium chloride irrigation      Kirkland Hun, RD, LDN Pager #: [phone] After-Hours Pager #: [phone]

## 2012-07-21 ENCOUNTER — Encounter (HOSPITAL_COMMUNITY): Payer: Self-pay | Admitting: Dentistry

## 2012-07-21 ENCOUNTER — Other Ambulatory Visit (HOSPITAL_COMMUNITY): Payer: Self-pay | Admitting: Dentistry

## 2012-07-21 ENCOUNTER — Ambulatory Visit (HOSPITAL_COMMUNITY): Payer: Self-pay | Admitting: Dentistry

## 2012-07-21 VITALS — BP 170/77 | HR 72 | Temp 98.1°F

## 2012-07-21 DIAGNOSIS — K08199 Complete loss of teeth due to other specified cause, unspecified class: Secondary | ICD-10-CM

## 2012-07-21 DIAGNOSIS — K08109 Complete loss of teeth, unspecified cause, unspecified class: Secondary | ICD-10-CM

## 2012-07-21 DIAGNOSIS — K0530 Chronic periodontitis, unspecified: Secondary | ICD-10-CM

## 2012-07-21 DIAGNOSIS — K036 Deposits [accretions] on teeth: Secondary | ICD-10-CM

## 2012-07-21 DIAGNOSIS — K08409 Partial loss of teeth, unspecified cause, unspecified class: Secondary | ICD-10-CM

## 2012-07-21 MED ORDER — AMOXICILLIN 500 MG PO CAPS: ORAL_CAPSULE | ORAL | Status: DC

## 2012-07-21 NOTE — Progress Notes (Signed)
07/21/2012  Patient:            Cody Hale Date of Birth:  01/26/1951 MRN:                [id number]    POST OPERATIVE NOTE:      Postop day # 10  VITALS: BP 170/77  Pulse 72  Temp 98.1 F (36.7 C)  Otto D Roskelley is status post multiple extractions with alveoloplasty and pre-prosthetic surgery along with gross of remaining teeth on 07/10/2012.  SUBJECTIVE: The patient has minimal complaints. Sutures are still present by his report.  Patient indicates that he is to follow up with Dr. Andee Lineman for further evaluation and treatment of the endocarditis. Patient currently remains on antibiotic therapy as previously prescribed.  Patient indicates that a TEE will be scheduled in the future to determine if heart valve surgery is required.  Patient indicates that he will follow Dr. Jearl Klinefelter (primary dentist) in 4-6 weeks for evaluation for replacement of missing teeth and other dental treatment as indicated.  Patient is aware of the need for antibiotic premedication prior to invasive dental procedures in the future.  Patient remains on chlorhexidine rinses twice daily to aid in this infection of the oral cavity.  EXAM: No sign of infection, heme, or ooze. Sutures are loosely intact. Healing is progressing well with generalized primary closure. Plaque is noted and oral hygiene improvement was recommended.   ASSESSMENT: Post operative course is consistent with dental procedures performed in the operating room on 07/10/12.   PLAN: 1. Continue on chlorhexidine rinses twice daily use in the morning after breakfast and at bedtime. 2. Patient to use salt water rinses as needed every 2 hours while awake in between the chlorhexidine rinses. 3. Patient to follow up with Dr. Jearl Klinefelter (primary dentist) for evaluation for replacement of missing teeth as indicated. Patient to have Dr. Jearl Klinefelter call me to discuss his case as needed. 4. Patient to continue soft mechanical diet and advance diet as  tolerated. 5. Prescription for antibiotic premedication to be faxed to [facility] in  [place], [place] at this time. 6. Patient to call for additional questions or concerns  Charlynne Pander, DDS

## 2012-07-21 NOTE — Patient Instructions (Signed)
PLAN: 1. Continue on chlorhexidine rinses twice daily use in the morning after breakfast and at bedtime. 2. Patient to use salt water rinses as needed every 2 hours while awake in between the chlorhexidine rinses. 3. Patient to follow up with Dr. Jearl Klinefelter (primary dentist) for evaluation for replacement of missing teeth as indicated. Patient to have Dr. Jearl Klinefelter call me to discuss his case as needed. 4. Patient to continue soft mechanical diet and advance diet as tolerated. 5. Prescription for antibiotic premedication to be faxed to [facility] in  [place], [place] at this time. 6. Patient to call for additional questions or concerns  Dr. Kristin Bruins

## 2012-08-10 ENCOUNTER — Encounter: Payer: Self-pay | Admitting: Thoracic Surgery (Cardiothoracic Vascular Surgery)

## 2012-08-10 ENCOUNTER — Ambulatory Visit (INDEPENDENT_AMBULATORY_CARE_PROVIDER_SITE_OTHER): Payer: Self-pay | Admitting: Thoracic Surgery (Cardiothoracic Vascular Surgery)

## 2012-08-10 VITALS — BP 146/72 | HR 68 | Temp 97.9°F | Resp 16 | Ht 70.0 in | Wt 296.0 lb

## 2012-08-10 DIAGNOSIS — I330 Acute and subacute infective endocarditis: Secondary | ICD-10-CM

## 2012-08-10 NOTE — Progress Notes (Signed)
[address]            [place] [address]          [phone]     CARDIOTHORACIC SURGERY OFFICE NOTE  Referring Provider is Lewayne Bunting, MD PCP is No primary provider on file.   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 This was originally discovered by his primary cardiologist, Dr. Andee Lineman, who referred him to [facility] in December for further evaluation and possible treatment.  I originally saw him in consultation on 06/26/2012 during that hospitalization.  He underwent an extensive evaluation during that hospitalization which has been fairly well documented in the discharge summary dated 07/15/2012. During his hospitalization he was cared for by specialist from the cardiology, infectious disease, and dental service teams. He underwent dental extraction as well.  The patient has remained clinically stable without any signs of congestive heart failure and followup transesophageal echocardiogram performed during his hospitalization revealed no significant mitral regurgitation with stable appearance of the vegetation on the mitral valve.  Cultures of all remaining negative and the patient never displayed any signs of distal embolization. A decision was made to continue to treat him medically without surgical intervention unless he were to develop signs of congestive heart failure, embolization or recurrent/persistent infection after medical therapy.  The patient was ultimately discharged from the hospital on Zyvox which he continues to take at this time.  Since hospital discharge she has done fairly well. He has been seen in followup by Dr. Andee Lineman who continues to follow him closely. Repeat transesophageal echocardiogram has been planned after he completes his course of antibiotics.    The  patient reports stable chronic symptoms of exertional shortness of breath and fatigue. He denies any worsening shortness of breath. He denies resting shortness of breath, PND, orthopnea, chest pain, dizzy spells, or syncope. Denies any transient numbness or weakness involving either upper or lower extremity and he also denies any transient visual disturbances. He has not been having fevers or chills. He has had some trouble with depression but he reports that this is already started to improve since he resumed taking anti-depression medications.   Current Outpatient Prescriptions  Medication Sig Dispense Refill  . amLODipine (NORVASC) 5 MG tablet Take 1 tablet (5 mg total) by mouth daily.  30 tablet  3  . amoxicillin (AMOXIL) 500 MG capsule Take four capsules one hour before dental appointment.  4 capsule  1  . aspirin EC 81 MG tablet Take 81 mg by mouth daily.      . chlorhexidine (PERIDEX) 0.12 % solution Use as directed 15 mLs in the mouth or throat 2 (two) times daily.  120 mL  0  . feeding supplement (RESOURCE BREEZE) LIQD Take 1 Container by mouth 3 (three) times daily between meals.      . gabapentin (NEURONTIN) 100 MG capsule Take 1 capsule (100 mg total) by mouth 3 (three) times daily.  90 capsule  3  . glipiZIDE (GLUCOTROL) 10 MG tablet Take 1 tablet (10 mg total) by mouth daily before breakfast.  30 tablet  3  . insulin aspart (NOVOLOG) 100 UNIT/ML injection Inject 0-15 Units into the skin 3 (three) times daily with meals.  1 vial  3  .  insulin aspart (NOVOLOG) 100 UNIT/ML injection Inject 0-5 Units into the skin at bedtime.  1 vial  3  . insulin glargine (LANTUS) 100 UNIT/ML injection Inject 20 Units into the skin at bedtime.  10 mL  2  . linezolid (ZYVOX) 600 MG tablet Take 1 tablet (600 mg total) by mouth every 12 (twelve) hours.  56 tablet  0  . metoprolol (LOPRESSOR) 50 MG tablet Take 1 tablet (50 mg total) by mouth 2 (two) times daily.  60 tablet  3  . Multiple Vitamin (MULTIVITAMIN  WITH MINERALS) TABS Take 1 tablet by mouth daily.      . nitroGLYCERIN (NITROSTAT) 0.4 MG SL tablet Place 1 tablet (0.4 mg total) under the tongue every 5 (five) minutes x 3 doses as needed for chest pain.  25 tablet  3  . Omega-3 Fatty Acids (FISH OIL PO) Take 1 capsule by mouth daily.      . sertraline (ZOLOFT) 100 MG tablet Take 100 mg by mouth daily.      . traZODone (DESYREL) 100 MG tablet Take 100 mg by mouth at bedtime.          Physical Exam:   BP 146/72  Pulse 68  Temp 97.9 F (36.6 C) (Oral)  Resp 16  Ht 5\' 10"  (1.778 m)  Wt 296 lb (134.265 kg)  BMI 42.47 kg/m2  SpO2 97%  General:  Morbidly obese  Chest:   Clear to auscultation without rales or rhonchi  CV:   Regular rate and rhythm without murmur  Incisions:  n/a  Abdomen:  Soft and nontender  Extremities:  Warm and adequately perfused  Diagnostic Tests:  n/a   Impression:  The patient remains clinically stable on medical therapy for presumed bacterial endocarditis.  Plan:  The patient will continue to followup with Dr. Andee Lineman who plans repeat transesophageal echocardiogram after the patient has completed his course of antibiotics.  I discussed matters at length with the patient and his wife here in the office today. We're still hopeful that we can avoid the need for surgical intervention. I will plan to see him back in 6 months or earlier if needed.    Salvatore Decent. Cornelius Moras, MD 08/10/2012 6:56 PM

## 2012-08-10 NOTE — Patient Instructions (Signed)
Continue to follow up closely with Dr Andee Lineman

## 2012-08-12 ENCOUNTER — Ambulatory Visit (INDEPENDENT_AMBULATORY_CARE_PROVIDER_SITE_OTHER): Payer: Self-pay | Admitting: Infectious Diseases

## 2012-08-12 ENCOUNTER — Encounter: Payer: Self-pay | Admitting: Infectious Diseases

## 2012-08-12 VITALS — BP 112/64 | HR 78 | Temp 98.0°F | Ht 70.0 in | Wt 304.0 lb

## 2012-08-12 DIAGNOSIS — I38 Endocarditis, valve unspecified: Secondary | ICD-10-CM

## 2012-08-12 NOTE — Assessment & Plan Note (Addendum)
He appears to be doing well. Will plan on him completing his anbx in the next 3-4 days. Will await the result of his TEE. Plan to see him back in 1 month with repeat BCx then. Need to recheck CMP and CBC to eval for ADRs from zyvox.  I strongly encouraged he (and his wife) to f/u with PCP or CV to have his fluid status/edema addressed.

## 2012-08-12 NOTE — Progress Notes (Signed)
  Subjective:    Patient ID: Cody Hale, male    DOB: 02/23/1951, 62 y.o.   MRN: [id number]  HPI 62 y.o. M who was admitted to [facility] on December there 16th. He had developed fevers and chills, hypotension as well as fatigue over the previous 2 weeks. He was noted to have cellulitis of his LLE. He was started on vancomycin and Zosyn there. As part of his workup he underwent a TTE EE which showed a mitral valve vegetation. He had blood cultures sent however these were done after he had been started on antibiotics. His blood cultures from [facility] were negative. He was transferred to [facility] on December 19 for for further evaluation. By December 22 his vancomycin had been stopped. He developed acute renal failure in the hospital felt to be due to vancomycin, hypotension, and nonsteroidal anti-inflammatory drug use. He underwent a repeat transesophageal echocardiogram which showed a large mitral valve vegetation. His antibiotics were changed to daptomycin on December 22. He socially developed a rash, and his antibiotics on December 26 were changed to Zyvox. He was noted to have poor dentition and on January 3 underwent dental extractions. By January 12 he was felt to be doing well enough to be discharged home. He was discharged home with the plan for 4 additional weeks of Zyvox. I personally verified with the case managers at this medicine was being obtained for him. In hospital he had serologies for Chlamydia, Histoplasma, Bartonella and Blastomyces all negative. He had repeat BCx at the end of December (on anbx) negative.  He had f/u with [facility] on 2-3 who was hopeful that we could continue to manage his vegetation medically.  Denies fever, per his wife has had chills (feels cold). No headaches. Scheduled to have repeat TEE later this month.   He was taken off his zoloft in hospital due to concerns about serotonin syndrome/drug interaction with zyvox.   He has 5 more doses  of zyvox at this point.   Review of Systems  Constitutional: Positive for chills. Negative for fever.  Respiratory: Negative for shortness of breath.   Cardiovascular: Positive for chest pain and leg swelling.       Chest pain related to stress, "getting upset". Goes away when he "calms down"  Gastrointestinal: Negative for diarrhea and constipation.  Genitourinary: Negative for dysuria.  Neurological: Negative for headaches.       Has intermittent numbness in the back of his thighs.        Objective:   Physical Exam  Constitutional: He appears well-developed and well-nourished.  HENT:  Mouth/Throat: No oropharyngeal exudate.  Eyes: Conjunctivae normal and EOM are normal. Pupils are equal, round, and reactive to light.  Neck: Neck supple.  Cardiovascular: Regular rhythm.  Exam reveals distant heart sounds.   Murmur heard.  Crescendo systolic murmur is present with a grade of 2/6  Pulmonary/Chest: Effort normal and breath sounds normal. He has no wheezes. He has no rales.  Abdominal: Soft. Bowel sounds are normal. There is no tenderness.  Musculoskeletal:       onychomycosis with loss of great toe nails.  He has significant LE edema, L > R, greater that 3+ on each side. Pitting.   Lymphadenopathy:    He has no cervical adenopathy.          Assessment & Plan:

## 2012-08-13 LAB — COMPREHENSIVE METABOLIC PANEL
ALT: 33 U/L (ref 0–53)
AST: 26 U/L (ref 0–37)
Albumin: 3.7 g/dL (ref 3.5–5.2)
Alkaline Phosphatase: 79 U/L (ref 39–117)
BUN: 14 mg/dL (ref 6–23)
CO2: 27 mEq/L (ref 19–32)
Calcium: 8.7 mg/dL (ref 8.4–10.5)
Chloride: 106 mEq/L (ref 96–112)
Creat: 0.86 mg/dL (ref 0.50–1.35)
Glucose, Bld: 131 mg/dL — ABNORMAL HIGH (ref 70–99)
Potassium: 4.1 mEq/L (ref 3.5–5.3)
Sodium: 142 mEq/L (ref 135–145)
Total Bilirubin: 0.2 mg/dL — ABNORMAL LOW (ref 0.3–1.2)
Total Protein: 5.9 g/dL — ABNORMAL LOW (ref 6.0–8.3)

## 2012-08-13 LAB — CBC WITH DIFFERENTIAL/PLATELET
Basophils Absolute: 0 10*3/uL (ref 0.0–0.1)
Basophils Relative: 0 % (ref 0–1)
Eosinophils Absolute: 0.1 10*3/uL (ref 0.0–0.7)
Eosinophils Relative: 3 % (ref 0–5)
HCT: 29.1 % — ABNORMAL LOW (ref 39.0–52.0)
Hemoglobin: 9.3 g/dL — ABNORMAL LOW (ref 13.0–17.0)
Lymphocytes Relative: 19 % (ref 12–46)
Lymphs Abs: 1 10*3/uL (ref 0.7–4.0)
MCH: 29.3 pg (ref 26.0–34.0)
MCHC: 32 g/dL (ref 30.0–36.0)
MCV: 91.8 fL (ref 78.0–100.0)
Monocytes Absolute: 0.5 10*3/uL (ref 0.1–1.0)
Monocytes Relative: 10 % (ref 3–12)
Neutro Abs: 3.6 10*3/uL (ref 1.7–7.7)
Neutrophils Relative %: 68 % (ref 43–77)
Platelets: 174 10*3/uL (ref 150–400)
RBC: 3.17 MIL/uL — ABNORMAL LOW (ref 4.22–5.81)
RDW: 13.9 % (ref 11.5–15.5)
WBC: 5.3 10*3/uL (ref 4.0–10.5)

## 2012-08-26 ENCOUNTER — Inpatient Hospital Stay (HOSPITAL_COMMUNITY)
Admission: AD | Admit: 2012-08-26 | Discharge: 2012-08-29 | DRG: 287 | Disposition: A | Discharge status: Home / Self Care | Payer: MEDICAID | Source: Ambulatory Visit | Attending: Thoracic Surgery (Cardiothoracic Vascular Surgery) | Admitting: Thoracic Surgery (Cardiothoracic Vascular Surgery)

## 2012-08-26 ENCOUNTER — Institutional Professional Consult (permissible substitution): Payer: Self-pay | Admitting: Thoracic Surgery (Cardiothoracic Vascular Surgery)

## 2012-08-26 ENCOUNTER — Inpatient Hospital Stay (HOSPITAL_COMMUNITY): Payer: MEDICAID

## 2012-08-26 ENCOUNTER — Other Ambulatory Visit: Payer: Self-pay

## 2012-08-26 ENCOUNTER — Encounter (HOSPITAL_COMMUNITY): Payer: Self-pay | Admitting: General Practice

## 2012-08-26 ENCOUNTER — Institutional Professional Consult (permissible substitution) (INDEPENDENT_AMBULATORY_CARE_PROVIDER_SITE_OTHER): Payer: Self-pay | Admitting: Thoracic Surgery (Cardiothoracic Vascular Surgery)

## 2012-08-26 ENCOUNTER — Encounter: Payer: Self-pay | Admitting: Thoracic Surgery (Cardiothoracic Vascular Surgery)

## 2012-08-26 VITALS — BP 116/56 | HR 66 | Temp 100.4°F | Resp 16 | Ht 70.0 in | Wt 304.0 lb

## 2012-08-26 DIAGNOSIS — I5032 Chronic diastolic (congestive) heart failure: Secondary | ICD-10-CM | POA: Diagnosis present

## 2012-08-26 DIAGNOSIS — Z794 Long term (current) use of insulin: Secondary | ICD-10-CM

## 2012-08-26 DIAGNOSIS — I2510 Atherosclerotic heart disease of native coronary artery without angina pectoris: Secondary | ICD-10-CM | POA: Diagnosis present

## 2012-08-26 DIAGNOSIS — I340 Nonrheumatic mitral (valve) insufficiency: Secondary | ICD-10-CM | POA: Diagnosis present

## 2012-08-26 DIAGNOSIS — F3289 Other specified depressive episodes: Secondary | ICD-10-CM | POA: Diagnosis present

## 2012-08-26 DIAGNOSIS — E119 Type 2 diabetes mellitus without complications: Secondary | ICD-10-CM | POA: Diagnosis present

## 2012-08-26 DIAGNOSIS — Z87891 Personal history of nicotine dependence: Secondary | ICD-10-CM

## 2012-08-26 DIAGNOSIS — I447 Left bundle-branch block, unspecified: Secondary | ICD-10-CM | POA: Diagnosis present

## 2012-08-26 DIAGNOSIS — R3129 Other microscopic hematuria: Secondary | ICD-10-CM | POA: Diagnosis present

## 2012-08-26 DIAGNOSIS — B964 Proteus (mirabilis) (morganii) as the cause of diseases classified elsewhere: Secondary | ICD-10-CM | POA: Diagnosis present

## 2012-08-26 DIAGNOSIS — R509 Fever, unspecified: Secondary | ICD-10-CM | POA: Insufficient documentation

## 2012-08-26 DIAGNOSIS — Z6841 Body Mass Index (BMI) 40.0 and over, adult: Secondary | ICD-10-CM

## 2012-08-26 DIAGNOSIS — I059 Rheumatic mitral valve disease, unspecified: Secondary | ICD-10-CM | POA: Diagnosis present

## 2012-08-26 DIAGNOSIS — I38 Endocarditis, valve unspecified: Secondary | ICD-10-CM

## 2012-08-26 DIAGNOSIS — I6529 Occlusion and stenosis of unspecified carotid artery: Secondary | ICD-10-CM | POA: Diagnosis present

## 2012-08-26 DIAGNOSIS — I330 Acute and subacute infective endocarditis: Secondary | ICD-10-CM | POA: Diagnosis present

## 2012-08-26 DIAGNOSIS — I498 Other specified cardiac arrhythmias: Secondary | ICD-10-CM | POA: Diagnosis present

## 2012-08-26 DIAGNOSIS — D649 Anemia, unspecified: Secondary | ICD-10-CM | POA: Diagnosis present

## 2012-08-26 DIAGNOSIS — F329 Major depressive disorder, single episode, unspecified: Secondary | ICD-10-CM | POA: Diagnosis present

## 2012-08-26 DIAGNOSIS — Z79899 Other long term (current) drug therapy: Secondary | ICD-10-CM

## 2012-08-26 DIAGNOSIS — M129 Arthropathy, unspecified: Secondary | ICD-10-CM | POA: Diagnosis present

## 2012-08-26 DIAGNOSIS — I10 Essential (primary) hypertension: Secondary | ICD-10-CM | POA: Diagnosis present

## 2012-08-26 DIAGNOSIS — I509 Heart failure, unspecified: Secondary | ICD-10-CM | POA: Diagnosis present

## 2012-08-26 DIAGNOSIS — R0789 Other chest pain: Secondary | ICD-10-CM | POA: Diagnosis present

## 2012-08-26 HISTORY — DX: Endocarditis, valve unspecified: I38

## 2012-08-26 LAB — CBC
HCT: 33.2 % — ABNORMAL LOW (ref 39.0–52.0)
Hemoglobin: 10.7 g/dL — ABNORMAL LOW (ref 13.0–17.0)
MCH: 31.3 pg (ref 26.0–34.0)
MCHC: 32.2 g/dL (ref 30.0–36.0)
MCV: 97.1 fL (ref 78.0–100.0)
Platelets: 163 10*3/uL (ref 150–400)
RBC: 3.42 MIL/uL — ABNORMAL LOW (ref 4.22–5.81)
RDW: 17.1 % — ABNORMAL HIGH (ref 11.5–15.5)
WBC: 4.5 10*3/uL (ref 4.0–10.5)

## 2012-08-26 LAB — URINALYSIS, ROUTINE W REFLEX MICROSCOPIC
Bilirubin Urine: NEGATIVE
Glucose, UA: NEGATIVE mg/dL
Ketones, ur: NEGATIVE mg/dL
Leukocytes, UA: NEGATIVE
Nitrite: NEGATIVE
Protein, ur: NEGATIVE mg/dL
Specific Gravity, Urine: 1.02 (ref 1.005–1.030)
Urobilinogen, UA: 1 mg/dL (ref 0.0–1.0)
pH: 6.5 (ref 5.0–8.0)

## 2012-08-26 LAB — APTT: aPTT: 30 seconds (ref 24–37)

## 2012-08-26 LAB — TROPONIN I
Troponin I: <0.3 ng/mL (ref <0.30)
Troponin I: <0.3 ng/mL (ref <0.30)

## 2012-08-26 LAB — URINE MICROSCOPIC-ADD ON

## 2012-08-26 LAB — DIFFERENTIAL
Basophils Absolute: 0 10*3/uL (ref 0.0–0.1)
Basophils Relative: 1 % (ref 0–1)
Eosinophils Absolute: 0.1 10*3/uL (ref 0.0–0.7)
Eosinophils Relative: 3 % (ref 0–5)
Lymphocytes Relative: 20 % (ref 12–46)
Lymphs Abs: 0.9 10*3/uL (ref 0.7–4.0)
Monocytes Absolute: 0.5 10*3/uL (ref 0.1–1.0)
Monocytes Relative: 11 % (ref 3–12)
Neutro Abs: 2.9 10*3/uL (ref 1.7–7.7)
Neutrophils Relative %: 65 % (ref 43–77)

## 2012-08-26 LAB — COMPREHENSIVE METABOLIC PANEL
ALT: 32 U/L (ref 0–53)
AST: 26 U/L (ref 0–37)
Albumin: 3.8 g/dL (ref 3.5–5.2)
Alkaline Phosphatase: 79 U/L (ref 39–117)
BUN: 12 mg/dL (ref 6–23)
CO2: 26 mEq/L (ref 19–32)
Calcium: 9.3 mg/dL (ref 8.4–10.5)
Chloride: 106 mEq/L (ref 96–112)
Creatinine, Ser: 0.88 mg/dL (ref 0.50–1.35)
GFR calc Af Amer: >90 mL/min (ref >90)
GFR calc non Af Amer: >90 mL/min (ref >90)
Glucose, Bld: 107 mg/dL — ABNORMAL HIGH (ref 70–99)
Potassium: 4.2 mEq/L (ref 3.5–5.1)
Sodium: 141 mEq/L (ref 135–145)
Total Bilirubin: 0.3 mg/dL (ref 0.3–1.2)
Total Protein: 6.5 g/dL (ref 6.0–8.3)

## 2012-08-26 LAB — PROTIME-INR
INR: 0.99 (ref 0.00–1.49)
Prothrombin Time: 13 seconds (ref 11.6–15.2)

## 2012-08-26 LAB — GLUCOSE, CAPILLARY
Glucose-Capillary: 101 mg/dL — ABNORMAL HIGH (ref 70–99)
Glucose-Capillary: 116 mg/dL — ABNORMAL HIGH (ref 70–99)

## 2012-08-26 LAB — PRO B NATRIURETIC PEPTIDE: Pro B Natriuretic peptide (BNP): 507.1 pg/mL — ABNORMAL HIGH (ref 0–125)

## 2012-08-26 LAB — SEDIMENTATION RATE: Sed Rate: 18 mm/hr — ABNORMAL HIGH (ref 0–16)

## 2012-08-26 MED ORDER — ASPIRIN EC 81 MG PO TBEC
81.0000 mg | DELAYED_RELEASE_TABLET | Freq: Every day | ORAL | Status: DC
  Administered 2012-08-27 – 2012-08-29 (×2): 81 mg via ORAL
  Filled 2012-08-26 (×4): qty 1

## 2012-08-26 MED ORDER — GABAPENTIN 100 MG PO CAPS
100.0000 mg | ORAL_CAPSULE | Freq: Three times a day (TID) | ORAL | Status: DC
  Administered 2012-08-26 – 2012-08-29 (×7): 100 mg via ORAL
  Filled 2012-08-26 (×10): qty 1

## 2012-08-26 MED ORDER — SERTRALINE HCL 100 MG PO TABS
100.0000 mg | ORAL_TABLET | Freq: Every day | ORAL | Status: DC
  Administered 2012-08-27 – 2012-08-29 (×3): 100 mg via ORAL
  Filled 2012-08-26 (×4): qty 1

## 2012-08-26 MED ORDER — ADULT MULTIVITAMIN W/MINERALS CH
1.0000 | ORAL_TABLET | Freq: Every day | ORAL | Status: DC
  Administered 2012-08-27 – 2012-08-29 (×3): 1 via ORAL
  Filled 2012-08-26 (×4): qty 1

## 2012-08-26 MED ORDER — TRAZODONE HCL 100 MG PO TABS
100.0000 mg | ORAL_TABLET | Freq: Every day | ORAL | Status: DC
  Administered 2012-08-26 – 2012-08-28 (×3): 100 mg via ORAL
  Filled 2012-08-26 (×4): qty 1

## 2012-08-26 MED ORDER — NITROGLYCERIN 0.4 MG SL SUBL: 0.4000 mg | SUBLINGUAL_TABLET | SUBLINGUAL | Status: DC | PRN

## 2012-08-26 MED ORDER — INSULIN ASPART 100 UNIT/ML ~~LOC~~ SOLN: 0.0000 [IU] | Freq: Three times a day (TID) | SUBCUTANEOUS | Status: DC

## 2012-08-26 MED ORDER — ENOXAPARIN SODIUM 40 MG/0.4ML ~~LOC~~ SOLN
40.0000 mg | SUBCUTANEOUS | Status: DC
  Administered 2012-08-26 – 2012-08-27 (×2): 40 mg via SUBCUTANEOUS
  Filled 2012-08-26 (×3): qty 0.4

## 2012-08-26 MED ORDER — METOPROLOL TARTRATE 50 MG PO TABS
50.0000 mg | ORAL_TABLET | Freq: Two times a day (BID) | ORAL | Status: DC
  Administered 2012-08-26: 50 mg via ORAL
  Filled 2012-08-26 (×3): qty 1

## 2012-08-26 MED ORDER — INSULIN ASPART 100 UNIT/ML ~~LOC~~ SOLN: 0.0000 [IU] | Freq: Every day | SUBCUTANEOUS | Status: DC

## 2012-08-26 MED ORDER — GLIPIZIDE 10 MG PO TABS
10.0000 mg | ORAL_TABLET | Freq: Every day | ORAL | Status: DC
  Administered 2012-08-27 – 2012-08-29 (×3): 10 mg via ORAL
  Filled 2012-08-26 (×4): qty 1

## 2012-08-26 MED ORDER — INSULIN GLARGINE 100 UNIT/ML ~~LOC~~ SOLN
20.0000 [IU] | Freq: Every day | SUBCUTANEOUS | Status: DC
  Administered 2012-08-26 – 2012-08-28 (×3): 20 [IU] via SUBCUTANEOUS

## 2012-08-26 MED ORDER — AMLODIPINE BESYLATE 5 MG PO TABS
5.0000 mg | ORAL_TABLET | Freq: Every day | ORAL | Status: DC
  Administered 2012-08-27 – 2012-08-29 (×3): 5 mg via ORAL
  Filled 2012-08-26 (×5): qty 1

## 2012-08-26 MED ORDER — INSULIN ASPART 100 UNIT/ML ~~LOC~~ SOLN
0.0000 [IU] | Freq: Three times a day (TID) | SUBCUTANEOUS | Status: DC
  Administered 2012-08-27: 3 [IU] via SUBCUTANEOUS
  Administered 2012-08-28 – 2012-08-29 (×2): 4 [IU] via SUBCUTANEOUS
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
## 2012-08-26 NOTE — Patient Instructions (Signed)
Go directly to hospital admissions  

## 2012-08-27 ENCOUNTER — Encounter (HOSPITAL_COMMUNITY): Payer: Self-pay | Admitting: Nurse Practitioner

## 2012-08-27 DIAGNOSIS — I330 Acute and subacute infective endocarditis: Secondary | ICD-10-CM

## 2012-08-27 DIAGNOSIS — I059 Rheumatic mitral valve disease, unspecified: Secondary | ICD-10-CM

## 2012-08-27 DIAGNOSIS — R079 Chest pain, unspecified: Secondary | ICD-10-CM

## 2012-08-27 DIAGNOSIS — I38 Endocarditis, valve unspecified: Secondary | ICD-10-CM

## 2012-08-27 LAB — GLUCOSE, CAPILLARY
Glucose-Capillary: 150 mg/dL — ABNORMAL HIGH (ref 70–99)
Glucose-Capillary: 164 mg/dL — ABNORMAL HIGH (ref 70–99)
Glucose-Capillary: 80 mg/dL (ref 70–99)
Glucose-Capillary: 153 mg/dL — ABNORMAL HIGH (ref 70–99)

## 2012-08-27 LAB — TSH: TSH: 1.093 u[IU]/mL (ref 0.350–4.500)

## 2012-08-27 LAB — HEMOGLOBIN A1C
Hgb A1c MFr Bld: 7.2 % — ABNORMAL HIGH (ref <5.7)
Mean Plasma Glucose: 160 mg/dL — ABNORMAL HIGH (ref <117)

## 2012-08-27 LAB — PREALBUMIN: Prealbumin: 20.1 mg/dL (ref 17.0–34.0)

## 2012-08-27 LAB — TROPONIN I: Troponin I: <0.3 ng/mL (ref <0.30)

## 2012-08-27 MED ORDER — SODIUM CHLORIDE 0.9 % IJ SOLN
3.0000 mL | Freq: Two times a day (BID) | INTRAMUSCULAR | Status: DC
  Administered 2012-08-27: 3 mL via INTRAVENOUS

## 2012-08-27 MED ORDER — METOPROLOL TARTRATE 25 MG PO TABS
25.0000 mg | ORAL_TABLET | Freq: Two times a day (BID) | ORAL | Status: DC
  Administered 2012-08-27 – 2012-08-29 (×5): 25 mg via ORAL
  Filled 2012-08-27 (×7): qty 1

## 2012-08-27 MED ORDER — SODIUM CHLORIDE 0.9 % IJ SOLN: 3.0000 mL | INTRAMUSCULAR | Status: DC | PRN

## 2012-08-27 MED ORDER — SODIUM CHLORIDE 0.9 % IV SOLN: 250.0000 mL | INTRAVENOUS | Status: DC | PRN

## 2012-08-27 MED ORDER — SODIUM CHLORIDE 0.9 % IV SOLN
1.0000 mL/kg/h | INTRAVENOUS | Status: DC
  Administered 2012-08-27: 1 mL/kg/h via INTRAVENOUS
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
## 2012-08-27 NOTE — Progress Notes (Addendum)
Subjective:  Cody. Cody Hale states he is feeling better. He continues to have some "twinges" of chest pain.   Objective: Vital signs in last 24 hours: Temp:  [97.6 F (36.4 C)-100.4 F (38 C)] 97.6 F (36.4 C) (02/20 0513) Pulse Rate:  [61-112] 112 (02/20 0513) Cardiac Rhythm:  [-] Normal sinus rhythm;Bundle branch block (02/19 1958) Resp:  [16-18] 18 (02/20 0513) BP: (116-154)/(56-75) 144/66 mmHg (02/20 0513) SpO2:  [92 %-99 %] 97 % (02/20 0513) Weight:  [304 lb (137.893 kg)] 304 lb (137.893 kg) (02/19 1603)  Intake/Output from previous day: 02/19 0701 - 02/20 0700 In: 1080 [P.O.:1080] Out: 925 [Urine:925]  General appearance: alert, cooperative and no distress Neurologic: intact Heart: regular rate and rhythm Lungs: clear to auscultation bilaterally Abdomen: soft, non-tender; bowel sounds normal; no masses,  no organomegaly  Lab Results:  Recent Labs  08/26/12 1630  WBC 4.5  HGB 10.7*  HCT 33.2*  PLT 163   BMET:  Recent Labs  08/26/12 1630  NA 141  K 4.2  CL 106  CO2 26  GLUCOSE 107*  BUN 12  CREATININE 0.88  CALCIUM 9.3    PT/INR:  Recent Labs  08/26/12 1630  LABPROT 13.0  INR 0.99   ABG    Component Value Date/Time   PHART 7.280* 06/30/2012 0454   HCO3 16.9* 06/30/2012 0454   TCO2 18.1 06/30/2012 0454   ACIDBASEDEF 8.6* 06/30/2012 0454   O2SAT 91.5 06/30/2012 0454   CBG (last 3)   Recent Labs  08/26/12 1637 08/26/12 2140 08/27/12 0600  GLUCAP 101* 116* 150*    Assessment/Plan:   1. Mobile Mass vs. Vegetation on anterior leaflet of Mitral Valve 2. Afebrile this morning, no leukocytosis- blood cultures pending 3. Chest pain- cardiac enzymes have been normal, will monitor 4. ID consult- pending 5. Dispo- continue current care, will continue to hold ABX until blood cultures result   LOS: 1 day    Cody Hale, Cody Hale 08/27/2012  I have seen and examined the patient and agree with the assessment and plan as outlined.  Sedimentation rate  18.  WBC normal.  No further fevers.  Await f/u from ID and cardiology teams.  Cody Hale continues to report "twinges" of SSCP radiating to left arm of unclear significance.  ECG with chronic LBBB.  Troponins negative.  Given his risk factors it might make sense to proceed with cardiac cath.  The presence of significant CAD might make it easier to decide what to do about his mitral valve.   OWEN,Cody Hale 08/27/2012 9:21 AM

## 2012-08-27 NOTE — Consult Note (Signed)
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 Durward Mallard  Principal Problem:   Endocarditis Active Problems:   Type 2 diabetes mellitus   Mitral regurgitation   Vegetation of heart valve   Fever   . amLODipine  5 mg Oral Daily  . aspirin EC  81 mg Oral Daily  . enoxaparin (LOVENOX) injection  40 mg Subcutaneous Q24H  . gabapentin  100 mg Oral TID  . glipiZIDE  10 mg Oral QAC breakfast  . insulin aspart  0-20 Units Subcutaneous TID WC  . insulin aspart  0-5 Units Subcutaneous QHS  . insulin glargine  20 Units Subcutaneous QHS  . metoprolol tartrate  25 mg Oral BID  . multivitamin with minerals  1 tablet Oral Daily  . sertraline  100 mg Oral Daily  . sodium chloride  3 mL Intravenous Q12H  . traZODone  100 mg Oral QHS    Recommendations: 1. Continue observation off of antibiotics 2. Await results of repeat blood cultures   Assessment: He has low-grade fever and persistent vegetation seen on his mitral valve with possible perforation. I agree with observation off of antibiotics pending repeat blood cultures. So far the blood cultures done yesterday are negative. I will consider repeating 2 more sets in the morning. The most likely explanation for culture-negative endocarditis since his prior treatment with vancomycin before blood cultures in December. I will last the blood cultures be evaluated for more fastidious, HACEK organisms.    HPI: Cody Hale is a 62 y.o. male who was admitted to [facility] on December 16 with left leg cellulitis. He was started on IV vancomycin. A transesophageal echocardiogram was done which showed a mitral valve vegetation. Hospital notes indicate that blood cultures were done after vancomycin was started. Blood cultures were negative. He developed renal insufficiency and the vancomycin was changed  to daptomycin. He then developed a rash so the daptomycin was changed to linezolid. Based on outpatient records by my partner, Dr. Ninetta Lights, he completed linezolid around February 8. He denies having any new problems since stopping antibiotics. He states that he has continued to have occasional mild chills over the past several months. These are unchanged in frequency or intensity. He has not been aware of any fevers and he has not had any sweats. He had 2 teeth extracted while hospitalized. He has not had any recent dental problems since the extractions. He had a followup transesophageal echocardiogram recently which suggested possible new perforation of the mitral valve leaflet. When he was seen in the office yesterday by Dr.Owen he had a low-grade fever of 100.4. He was readmitted to the hospital for further evaluation.   Review of Systems: Pertinent items are noted in HPI.  Past Medical History  Diagnosis Date  . Poor dentition 06/26/2012    a. Severe dental caries and cavities s/p multiple extractions.  . Diabetes mellitus without complication   . Left leg cellulitis   . Obesity   . History of tobacco abuse   . Mitral valve vegetation     a. 06/25/2012 Echo: EF 45-50%, mod-sev conc LVH, nl RV, ? MV veg, Trace TR;  b. 06/26/2012 TEE:  MV vegetation noted;  c. 08/2012 TEE: persisten MV veg with evidence of MV perforation.  . Hypertension   .  Heart murmur   . Arthritis   . Mitral regurgitation     a. mild.  . Morbid obesity 06/27/2012  . Family history of anesthesia complication   . 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 s/p zyvox rx.  . Depression     History  Substance Use Topics  . Smoking status: Former Smoker    Quit date: 07/08/1994  . Smokeless tobacco: Never Used     Comment: smoked about 1.5ppd x 15 yrs, quit 15 yrs ago.  . Alcohol Use: No    Family History  Problem Relation Age of Onset  . Lung cancer Father     died @ 71  . Ovarian cancer Mother     died @ 37   Allergies   Allergen Reactions  . Daptomycin Rash    OBJECTIVE: Blood pressure 144/66, pulse 112, temperature 97.6 F (36.4 C), temperature source Oral, resp. rate 18, height 5\' 10"  (1.778 m), weight 137.893 kg (304 lb), SpO2 97.00%. General: He is in no distress having his groin shaved in preparation for cardiac catheterization. Skin: No rash, splinter or conjunctival hemorrhages Oral: Many missing teeth Lungs: Clear Cor: Distant, regular heart sounds. No murmurs heard Abdomen: Obese, soft and nontender 1+ pitting edema of his legs to the level of the knee. No evidence of cellulitis  Microbiology: No results found for this or any previous visit (from the past 240 hour(s)).  Cliffton Asters, MD [facility] [phone] pager   [phone] cell 08/27/2012, 3:08 PM

## 2012-08-27 NOTE — Progress Notes (Signed)
See note in EPIC

## 2012-08-27 NOTE — Patient Instructions (Signed)
Direct admit to the hospital

## 2012-08-27 NOTE — Care Management Note (Unsigned)
    Page 1 of 1   08/27/2012     5:01:03 PM   CARE MANAGEMENT NOTE 08/27/2012  Patient:  Cody Hale, Cody Hale   Account Number:  [id number]  Date Initiated:  08/27/2012  Documentation initiated by:   [place]  Subjective/Objective Assessment:   PT ADM 08/26/12 WITH SUSPECTED ENDOCARDITIS; BLOOD CULTURES PENDING.  PTA, PT INDEPENDENT, LIVES WITH SPOUSE.     Action/Plan:   WILL FOLLOW FOR HOME NEEDS AS PT PROGRESSES.   Anticipated DC Date:  08/31/2012   Anticipated DC Plan:  HOME W HOME HEALTH SERVICES      DC Planning Services  CM consult      Choice offered to / List presented to:             Status of service:  In process, will continue to follow Medicare Important Message given?   (If response is "NO", the following Medicare IM given date fields will be blank) Date Medicare IM given:   Date Additional Medicare IM given:    Discharge Disposition:    Per UR Regulation:  Reviewed for med. necessity/level of care/duration of stay  If discussed at Long Length of Stay Meetings, dates discussed:    Comments:

## 2012-08-28 ENCOUNTER — Encounter (HOSPITAL_COMMUNITY)
Admission: AD | Disposition: A | Discharge status: Home / Self Care | Payer: Self-pay | Source: Ambulatory Visit | Attending: Thoracic Surgery (Cardiothoracic Vascular Surgery)

## 2012-08-28 DIAGNOSIS — I2510 Atherosclerotic heart disease of native coronary artery without angina pectoris: Secondary | ICD-10-CM

## 2012-08-28 DIAGNOSIS — I330 Acute and subacute infective endocarditis: Secondary | ICD-10-CM

## 2012-08-28 DIAGNOSIS — I059 Rheumatic mitral valve disease, unspecified: Secondary | ICD-10-CM

## 2012-08-28 HISTORY — PX: LEFT HEART CATHETERIZATION WITH CORONARY ANGIOGRAM: SHX5451

## 2012-08-28 HISTORY — PX: RIGHT HEART CATHETERIZATION: SHX5447

## 2012-08-28 LAB — POCT I-STAT 3, VENOUS BLOOD GAS (G3P V)
Acid-Base Excess: 1 mmol/L (ref 0.0–2.0)
Bicarbonate: 26.6 mEq/L — ABNORMAL HIGH (ref 20.0–24.0)
O2 Saturation: 64 %
TCO2: 28 mmol/L (ref 0–100)
pCO2, Ven: 47.2 mmHg (ref 45.0–50.0)
pH, Ven: 7.359 — ABNORMAL HIGH (ref 7.250–7.300)
pO2, Ven: 35 mmHg (ref 30.0–45.0)

## 2012-08-28 LAB — POCT I-STAT 3, ART BLOOD GAS (G3+)
Acid-base deficit: 1 mmol/L (ref 0.0–2.0)
Bicarbonate: 25 mEq/L — ABNORMAL HIGH (ref 20.0–24.0)
O2 Saturation: 89 %
TCO2: 26 mmol/L (ref 0–100)
pCO2 arterial: 43.9 mmHg (ref 35.0–45.0)
pH, Arterial: 7.364 (ref 7.350–7.450)
pO2, Arterial: 59 mmHg — ABNORMAL LOW (ref 80.0–100.0)

## 2012-08-28 LAB — GLUCOSE, CAPILLARY
Glucose-Capillary: 154 mg/dL — ABNORMAL HIGH (ref 70–99)
Glucose-Capillary: 136 mg/dL — ABNORMAL HIGH (ref 70–99)
Glucose-Capillary: 108 mg/dL — ABNORMAL HIGH (ref 70–99)
Glucose-Capillary: 161 mg/dL — ABNORMAL HIGH (ref 70–99)
Glucose-Capillary: 151 mg/dL — ABNORMAL HIGH (ref 70–99)

## 2012-08-28 SURGERY — LEFT HEART CATHETERIZATION WITH CORONARY ANGIOGRAM
Anesthesia: LOCAL

## 2012-08-28 MED ORDER — ONDANSETRON HCL 4 MG/2ML IJ SOLN: 4.0000 mg | Freq: Four times a day (QID) | INTRAMUSCULAR | Status: DC | PRN

## 2012-08-28 MED ORDER — FENTANYL CITRATE 0.05 MG/ML IJ SOLN
INTRAMUSCULAR | Status: AC
  Filled 2012-08-28: qty 2

## 2012-08-28 MED ORDER — METOPROLOL TARTRATE 25 MG PO TABS: 50.0000 mg | ORAL_TABLET | Freq: Two times a day (BID) | ORAL | Status: DC

## 2012-08-28 MED ORDER — MIDAZOLAM HCL 2 MG/2ML IJ SOLN
INTRAMUSCULAR | Status: AC
  Filled 2012-08-28: qty 2

## 2012-08-28 MED ORDER — LIDOCAINE HCL (PF) 1 % IJ SOLN
INTRAMUSCULAR | Status: AC
  Filled 2012-08-28: qty 30

## 2012-08-28 MED ORDER — HEPARIN (PORCINE) IN NACL 2-0.9 UNIT/ML-% IJ SOLN
INTRAMUSCULAR | Status: AC
  Filled 2012-08-28: qty 1000

## 2012-08-28 MED ORDER — ACETAMINOPHEN 325 MG PO TABS: 650.0000 mg | ORAL_TABLET | ORAL | Status: DC | PRN

## 2012-08-28 MED ORDER — SODIUM CHLORIDE 0.9 % IV SOLN
INTRAVENOUS | Status: AC
  Administered 2012-08-28: 13:00:00 via INTRAVENOUS

## 2012-08-28 NOTE — Progress Notes (Addendum)
                   [address]            [place] [address]          [phone]        Procedure(s) (LRB): LEFT HEART CATHETERIZATION WITH CORONARY ANGIOGRAM (N/A)  Subjective: Patient without complaints this am  Objective: Vital signs in last 24 hours: Temp:  [97.7 F (36.5 C)-98.2 F (36.8 C)] 98.2 F (36.8 C) (02/21 0624) Pulse Rate:  [60-68] 68 (02/21 0624) Cardiac Rhythm:  [-] Normal sinus rhythm;Bundle branch block (02/20 2205) Resp:  [18] 18 (02/21 0624) BP: (135-156)/(56-67) 135/56 mmHg (02/21 0624) SpO2:  [96 %-99 %] 96 % (02/21 0624) Weight:  [121.6 kg (268 lb 1.3 oz)] 121.6 kg (268 lb 1.3 oz) (02/21 0624)   Current Weight  08/28/12 121.6 kg (268 lb 1.3 oz)     Intake/Output from previous day: 02/20 0701 - 02/21 0700 In: 480 [P.O.:480] Out: 600 [Urine:600]   Physical Exam:  Cardiovascular: RRR (distant heart sounds) Pulmonary: Clear to auscultation bilaterally; no rales, wheezes, or rhonchi. Abdomen: Soft, non tender, bowel sounds present.   Lab Results: CBC: Recent Labs  08/26/12 1630  WBC 4.5  HGB 10.7*  HCT 33.2*  PLT 163   BMET:  Recent Labs  08/26/12 1630  NA 141  K 4.2  CL 106  CO2 26  GLUCOSE 107*  BUN 12  CREATININE 0.88  CALCIUM 9.3    PT/INR:  Lab Results  Component Value Date   INR 0.99 08/26/2012   ABG:  INR: Will add last result for INR, ABG once components are confirmed Will add last 4 CBG results once components are confirmed  Assessment/Plan:  1. CV - Mobile mass vs vegetation on anterior leaflet of the mitral valve. Blood cultures pending (off antibiotics) .Remains afebrile and WBC has not been elevated. No further twinges of SSCP since yesterday morning. Troponins have been negative. For catheterization today.   ZIMMERMAN,DONIELLE MPA-C 08/28/2012,7:47 AM  I have seen and examined the patient and agree with the assessment and plan as outlined.  OWEN,CLARENCE H 08/28/2012 1:23  PM

## 2012-08-28 NOTE — CV Procedure (Signed)
    Cardiac Catheterization Operative Report  ARIANA CAVENAUGH [id number] 2/21/20149:35 AM MUSE,ROCHELLE D., PA  Procedure Performed:  1. Left Heart Catheterization 2. Selective Coronary Angiography 3. Right Heart Catheterization 4. Left ventricular angiogram  Operator: Verne Carrow, MD  Indication:  62 yo male with history of DM, HTN, tobacco abuse and MV disease admitted with fevers and c/o chest pain. Cardiac cath to exclude obstructive CAD.                                Procedure Details: The risks, benefits, complications, treatment options, and expected outcomes were discussed with the patient. The patient and/or family concurred with the proposed plan, giving informed consent. The patient was brought to the cath lab after IV hydration was begun and oral premedication was given. The patient was further sedated with Versed and Fentanyl. The right groin was prepped and draped in the usual manner. Using the modified Seldinger access technique, a 5 French sheath was placed in the right femoral artery. A 6 French sheath was inserted into the right femoral vein. A multi-purpose catheter was used to perform a right heart catheterization. Standard diagnostic catheters were used to perform selective coronary angiography. A pigtail catheter was used to perform a left ventricular angiogram. There were no immediate complications. The patient was taken to the recovery area in stable condition.   Hemodynamic Findings: Ao:  152/70             LV: 151/9/19 RA:   7             RV: 32/3/7 PA:  26/13 (mean 17)      PCWP: 12  Fick Cardiac Output: 9.1 L/min Fick Cardiac Index: 3.65 L/min/m2 Central Aortic Saturation: 89% Pulmonary Artery Saturation: 64%   Angiographic Findings:  Left main: No obstructive disease.   Left Anterior Descending Artery: Large caliber vessel that courses to the apex. There is a moderate caliber diagonal branch. No obstructive disease noted.   Circumflex  Artery:  Large caliber vessel that gives rise to several obtuse marginal branches. No obstructive disease noted.   Right Coronary Artery: Large, dominant vessel with 30% eccentric stenosis in the distal vessel. The PDA is large in caliber and has mild plaque disease.   Left Ventricular Angiogram: LVEF 50-55%. Mild MR.   Impression: 1. Mild non-obstructive CAD 2. Preserved LV systolic function. 3. Mild mitral regurgitation 4. Non-cardiac chest pain  Recommendations: Continue medical management.        Complications:  None; patient tolerated the procedure well.

## 2012-08-28 NOTE — Interval H&P Note (Signed)
History and Physical Interval Note:  08/28/2012 9:00 AM  Cody Hale  has presented today for cardiac cath with the diagnosis of MV disease, chest pain. The various methods of treatment have been discussed with the patient and family. After consideration of risks, benefits and other options for treatment, the patient has consented to  Procedure(s): LEFT HEART CATHETERIZATION WITH CORONARY ANGIOGRAM (N/A) RIGHT HEART CATH as a surgical intervention .  The patient's history has been reviewed, patient examined, no change in status, stable for surgery.  I have reviewed the patient's chart and labs.  Questions were answered to the patient's satisfaction.     MCALHANY,CHRISTOPHER

## 2012-08-28 NOTE — Progress Notes (Signed)
Patient: Cody Hale / Admit Date: 08/26/2012 / Date of Encounter: 08/28/2012, 8:05 AM   Subjective  Denies further CP. No SOB, nausea, fevers overnight, or any other complaints. Denies BRBPR, melena, hematemesis, hematuria.   Objective   Telemetry: NSR, 6 beats atrial tach, 4 beats NSVT Physical Exam: Filed Vitals:   08/28/12 0624  BP: 135/56  Pulse: 68  Temp: 98.2 F (36.8 C)  Resp: 18   General: Well developed, well nourished obese WM in no acute distress. Head: Normocephalic, atraumatic, sclera non-icteric, no xanthomas, nares are without discharge. S/p multiple teeth extractions. Neck: JVD not elevated. Lungs: Clear bilaterally to auscultation without wheezes, rales, or rhonchi. Breathing is unlabored. Heart: RRR distant heart sounds.  Abdomen: Soft, non-tender, non-distended with normoactive bowel sounds. No hepatomegaly. No rebound/guarding. No obvious abdominal masses. Msk:  Strength and tone appear normal for age. Extremities: No clubbing or cyanosis. Tr-1+ bilat LE edema.  Distal pedal pulses are 2+ and equal bilaterally. Neuro: Alert and oriented X 3. Moves all extremities spontaneously. Psych:  Responds to questions appropriately with a normal affect.    Intake/Output Summary (Last 24 hours) at 08/28/12 0805 Last data filed at 08/28/12 0600  Gross per 24 hour  Intake    480 ml  Output    600 ml  Net   -120 ml    Inpatient Medications:  . amLODipine  5 mg Oral Daily  . aspirin EC  81 mg Oral Daily  . enoxaparin (LOVENOX) injection  40 mg Subcutaneous Q24H  . gabapentin  100 mg Oral TID  . glipiZIDE  10 mg Oral QAC breakfast  . insulin aspart  0-20 Units Subcutaneous TID WC  . insulin aspart  0-5 Units Subcutaneous QHS  . insulin glargine  20 Units Subcutaneous QHS  . metoprolol tartrate  25 mg Oral BID  . multivitamin with minerals  1 tablet Oral Daily  . sertraline  100 mg Oral Daily  . sodium chloride  3 mL Intravenous Q12H  . traZODone  100 mg Oral  QHS    Labs:  Recent Labs  08/26/12 1630  NA 141  K 4.2  CL 106  CO2 26  GLUCOSE 107*  BUN 12  CREATININE 0.88  CALCIUM 9.3    Recent Labs  08/26/12 1630  AST 26  ALT 32  ALKPHOS 79  BILITOT 0.3  PROT 6.5  ALBUMIN 3.8    Recent Labs  08/26/12 1630  WBC 4.5  NEUTROABS 2.9  HGB 10.7*  HCT 33.2*  MCV 97.1  PLT 163    Recent Labs  08/26/12 1631 08/26/12 2153 08/27/12 0440  TROPONINI <0.30 <0.30 <0.30     Recent Labs  08/26/12 1630  HGBA1C 7.2*     Radiology/Studies:  Dg Chest 2 View 08/26/2012  *RADIOLOGY REPORT*  Clinical Data: Rule out CHF  CHEST - 2 VIEW  Comparison: Prior chest x-ray 06/30/2012  Findings: Stable cardiomegaly and pulmonary vascular congestion without overt edema.  No focal airspace consolidation.  No pleural effusion or pneumothorax.  No acute osseous abnormality.  Pulmonary hyperexpansion and central bronchitic changes are unchanged compared to prior.  Bridging anterior syndesmophytes throughout the thoracic spine.  IMPRESSION:  1.  Stable cardiomegaly and pulmonary vascular congestion without overt edema to suggest CHF. 2.  Stable background changes of COPD. 3.  Bridging anterior syndesmophytes throughout the thoracic spine suggestive of ankylosing spondylosis.  Recommend clinical correlation.   Original Report Authenticated By: Malachy Moan, M.D.      Assessment  and Plan  1. 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 F/u echo showed that veg had enlarged and there was apparent evidence of MV perforation on recent TEE. We will plan diagnostic cath today to r/o obstructive CAD in setting of pending thoracic surgery. Consider checking HIV status. 2. Chest tightness: No obj evidence of ischemia. Troponins nl. Cath today.  3. HTN:  Currently elevated. Follow and titrate meds as necessary. 4. Anemia: hgb 12.6 (1/5) -> 9.3 (2/5) -> 10.7 (2/19). Send stools. Has had microscopic hematuria by UA. Send ucx. ?Further workup. 5. SVT/NSVT (h/o such): continue BB. 6. Carotid artery disease >80% RICA on dopplers 06/30/12: this will need to be taken into account in determining future steps. 7. Edema: weight has been extremely variable. Re-weigh this AM. pBNP not markedly elevated. Could consider switching amlodipine.  Signed, Ronie Spies PA-C   I have personally seen and examined this patient with Ronie Spies, PA-C. I agree with the assessment and plan as outlined above. Will plan cardiac cath today to exclude CAD and assess filling pressures.   Torre Pikus 9:03 AM 08/28/2012

## 2012-08-28 NOTE — Progress Notes (Signed)
TCTS BRIEF PROGRESS NOTE   Results of cath noted.  Cody Hale has remained afebrile and all cultures remain negative to date.  At present the only theoretical indication for mitral valve repair would be to decrease the presumed risk of embolization due to the somewhat large size of the mass seen on the mitral valve.  I am not at all convinced that this mass has increased in size in comparison with how it looked on previous TEE's.  Moreover, I am not completely convinced that the mass is a vegetation or that the patient ever had endocarditis.  Even if he did have endocarditis there have never been any signs of embolization, there are no current signs of ongoing persistent infection, and there remains only mild mitral regurgitation with no significant CHF.  Although mitral valve repair would likely be reasonably straightforward, the risks of surgery are not trivial given the patient's comorbidities.  Under the circumstances, as long as Cody Hale's blood cultures remain negative and he does not develop increased mitral regurgitation, signs of embolization or signs of infection, I remain disinclined to proceed with surgery.  I will order repeat blood cultures for today and tentatively plan d/c home tomorrow if he otherwise remains stable.  I will attempt to contact Dr Andee Lineman.  I discussed these matters at length with Cody Hale and his wife, spending >30 minutes at the bedside.  All questions answered.  OWEN,Cody H 08/28/2012 2:19 PM

## 2012-08-28 NOTE — Progress Notes (Signed)
Patient ID: Cody Hale, male   DOB: [birth year], 62 y.o.   MRN: [id number]         [facility]    Date of Admission:  08/26/2012     Principal Problem:   Endocarditis Active Problems:   Type 2 diabetes mellitus   Mitral regurgitation   Vegetation of heart valve   Fever   . amLODipine  5 mg Oral Daily  . aspirin EC  81 mg Oral Daily  . gabapentin  100 mg Oral TID  . glipiZIDE  10 mg Oral QAC breakfast  . insulin aspart  0-20 Units Subcutaneous TID WC  . insulin aspart  0-5 Units Subcutaneous QHS  . insulin glargine  20 Units Subcutaneous QHS  . metoprolol tartrate  25 mg Oral BID  . multivitamin with minerals  1 tablet Oral Daily  . sertraline  100 mg Oral Daily  . traZODone  100 mg Oral QHS    Subjective: He denies complaints.  Objective: Temp:  [97.7 F (36.5 C)-98.2 F (36.8 C)] 98.2 F (36.8 C) (02/21 1352) Pulse Rate:  [60-73] 69 (02/21 1352) Resp:  [18] 18 (02/21 1352) BP: (135-165)/(56-67) 149/60 mmHg (02/21 1352) SpO2:  [96 %-99 %] 96 % (02/21 1352) Weight:  [121.6 kg (268 lb 1.3 oz)] 121.6 kg (268 lb 1.3 oz) (02/21 0624)  General: He is sitting up in a chair watching television  Lab Results Lab Results  Component Value Date   WBC 4.5 08/26/2012   HGB 10.7* 08/26/2012   HCT 33.2* 08/26/2012   MCV 97.1 08/26/2012   PLT 163 08/26/2012    Lab Results  Component Value Date   CREATININE 0.88 08/26/2012   BUN 12 08/26/2012   NA 141 08/26/2012   K 4.2 08/26/2012   CL 106 08/26/2012   CO2 26 08/26/2012    Lab Results  Component Value Date   ALT 32 08/26/2012   AST 26 08/26/2012   ALKPHOS 79 08/26/2012   BILITOT 0.3 08/26/2012      Microbiology: Recent Results (from the past 240 hour(s))  CULTURE, BLOOD (ROUTINE X 2)     Status: None   Collection Time    08/26/12  4:17 PM      Result Value Range Status   Specimen Description BLOOD RIGHT ANTECUBITAL   Final   Special Requests BOTTLES DRAWN AEROBIC AND ANAEROBIC 10CC   Final   Culture  Setup Time 08/27/2012 03:32   Final   Culture     Final   Value:        BLOOD CULTURE RECEIVED NO GROWTH TO DATE CULTURE WILL BE HELD FOR 5 DAYS BEFORE ISSUING A FINAL NEGATIVE REPORT   Report Status PENDING   Incomplete  CULTURE, BLOOD (ROUTINE X 2)     Status: None   Collection Time    08/26/12  4:20 PM      Result Value Range Status   Specimen Description BLOOD LEFT ANTECUBITAL   Final   Special Requests BOTTLES DRAWN AEROBIC AND ANAEROBIC 10CC   Final   Culture  Setup Time 08/27/2012 03:28   Final   Culture     Final   Value:        BLOOD CULTURE RECEIVED NO GROWTH TO DATE CULTURE WILL BE HELD FOR 5 DAYS BEFORE ISSUING A FINAL NEGATIVE REPORT   Report Status PENDING   Incomplete  CULTURE, BLOOD (SINGLE)     Status: None   Collection Time  08/27/12  4:40 AM      Result Value Range Status   Specimen Description BLOOD RIGHT ARM   Final   Special Requests BOTTLES DRAWN AEROBIC AND ANAEROBIC 6CC   Final   Culture  Setup Time 08/27/2012 18:38   Final   Culture     Final   Value:        BLOOD CULTURE RECEIVED NO GROWTH TO DATE CULTURE WILL BE HELD FOR 5 DAYS BEFORE ISSUING A FINAL NEGATIVE REPORT   Report Status PENDING   Incomplete   Assessment: He has not had any further low-grade fevers and repeat blood cultures off of antibiotics are negative so far. I agree with discharge this weekend. I will make sure he is a followup visit with our clinic within the next 2 weeks. I will check final blood culture results.  Plan: 1. Observe off of antibiotics Please call Dr. Judyann Munson [phone] for infectious disease questions this weekend  Cliffton Asters, MD [facility] [phone] pager   [phone] cell 08/28/2012, 5:23 PM

## 2012-08-29 LAB — BASIC METABOLIC PANEL
BUN: 10 mg/dL (ref 6–23)
CO2: 28 mEq/L (ref 19–32)
Calcium: 9.4 mg/dL (ref 8.4–10.5)
Chloride: 103 mEq/L (ref 96–112)
Creatinine, Ser: 0.86 mg/dL (ref 0.50–1.35)
GFR calc Af Amer: >90 mL/min (ref >90)
GFR calc non Af Amer: >90 mL/min (ref >90)
Glucose, Bld: 184 mg/dL — ABNORMAL HIGH (ref 70–99)
Potassium: 4.3 mEq/L (ref 3.5–5.1)
Sodium: 139 mEq/L (ref 135–145)

## 2012-08-29 LAB — CBC
HCT: 35.9 % — ABNORMAL LOW (ref 39.0–52.0)
Hemoglobin: 11.6 g/dL — ABNORMAL LOW (ref 13.0–17.0)
MCH: 31.2 pg (ref 26.0–34.0)
MCHC: 32.3 g/dL (ref 30.0–36.0)
MCV: 96.5 fL (ref 78.0–100.0)
Platelets: 150 10*3/uL (ref 150–400)
RBC: 3.72 MIL/uL — ABNORMAL LOW (ref 4.22–5.81)
RDW: 17.3 % — ABNORMAL HIGH (ref 11.5–15.5)
WBC: 4.3 10*3/uL (ref 4.0–10.5)

## 2012-08-29 LAB — GLUCOSE, CAPILLARY
Glucose-Capillary: 160 mg/dL — ABNORMAL HIGH (ref 70–99)
Glucose-Capillary: 63 mg/dL — ABNORMAL LOW (ref 70–99)
Glucose-Capillary: 77 mg/dL (ref 70–99)

## 2012-08-29 MED ORDER — INSULIN GLARGINE 100 UNIT/ML ~~LOC~~ SOLN: 20.0000 [IU] | Freq: Every day | SUBCUTANEOUS | Status: DC

## 2012-08-29 MED ORDER — METOPROLOL TARTRATE 25 MG PO TABS: 25.0000 mg | ORAL_TABLET | Freq: Two times a day (BID) | ORAL | Status: DC

## 2012-08-29 MED ORDER — INSULIN ASPART 100 UNIT/ML ~~LOC~~ SOLN: 0.0000 [IU] | Freq: Every day | SUBCUTANEOUS | Status: DC

## 2012-08-29 MED ORDER — INSULIN ASPART 100 UNIT/ML ~~LOC~~ SOLN: 0.0000 [IU] | Freq: Three times a day (TID) | SUBCUTANEOUS | Status: DC

## 2012-08-29 NOTE — Progress Notes (Signed)
Pt/family given discharge instructions, medication lists, follow up appointments, and when to call the doctor.  Pt/family verbalizes understanding. Pt given signs and symptoms of infection. Noell Shular McClintock    

## 2012-08-29 NOTE — Progress Notes (Signed)
                   [address]            [place] [address]          [phone]      1 Day Post-Op Procedure(s) (LRB): LEFT HEART CATHETERIZATION WITH CORONARY ANGIOGRAM (N/A) RIGHT HEART CATH  Subjective: Patient without complaints this am  Objective: Vital signs in last 24 hours: Temp:  [97.5 F (36.4 C)-98.3 F (36.8 C)] 97.5 F (36.4 C) (02/22 0515) Pulse Rate:  [62-74] 62 (02/22 0515) Cardiac Rhythm:  [-] Normal sinus rhythm;Bundle branch block (02/21 1946) Resp:  [16-20] 16 (02/22 0515) BP: (138-165)/(55-70) 160/65 mmHg (02/22 0515) SpO2:  [96 %-99 %] 99 % (02/22 0515) Weight:  [133.6 kg (294 lb 8.6 oz)] 133.6 kg (294 lb 8.6 oz) (02/22 0515)   Current Weight  08/29/12 133.6 kg (294 lb 8.6 oz)     Intake/Output from previous day: 02/21 0701 - 02/22 0700 In: 1662 [P.O.:1662] Out: 200 [Urine:200]   Physical Exam:  Cardiovascular: RRR (distant heart sounds) Pulmonary: Clear to auscultation bilaterally; no rales, wheezes, or rhonchi. Abdomen: Soft, non tender, bowel sounds present.   Lab Results: CBC:  Recent Labs  08/26/12 1630 08/29/12 0616  WBC 4.5 4.3  HGB 10.7* 11.6*  HCT 33.2* 35.9*  PLT 163 150   BMET:   Recent Labs  08/26/12 1630 08/29/12 0616  NA 141 139  K 4.2 4.3  CL 106 103  CO2 26 28  GLUCOSE 107* 184*  BUN 12 10  CREATININE 0.88 0.86  CALCIUM 9.3 9.4    PT/INR:  Lab Results  Component Value Date   INR 0.99 08/26/2012   ABG:  INR: Will add last result for INR, ABG once components are confirmed Will add last 4 CBG results once components are confirmed  Assessment/Plan:  1. CV - Mobile mass vs vegetation on anterior leaflet of the mitral valve. Blood cultures and UC pending (off antibiotics) .Remains afebrile and WBC has not been elevated. No further twinges of SSCP. Troponins have been negative.  Dr. Cornelius Moras reviewed cath films (no significant coronary artery disease, mild MR, and preserved  LVEF) Probable discharge today  Cody Hale MPA-C 08/29/2012,8:00 AM

## 2012-08-29 NOTE — Discharge Summary (Signed)
Physician Discharge Summary  Patient ID: Cody Hale MRN: [id number] DOB/AGE: 1950/07/12 62 y.o.  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 Antoine Poche (cardiology) and Dr. Orvan Falconer (infectious disease)  Procedure (s):  1. Left Heart Catheterization 2. Selective Coronary Angiography 3. Right Heart Catheterization 4. Left ventricular angiogram by Dr. Clifton James on 08/28/2012  Angiographic Findings:  Left main: No obstructive disease.  Left Anterior Descending Artery: Large caliber vessel that courses to the apex. There is a moderate caliber diagonal branch. No obstructive disease noted.  Circumflex Artery: Large caliber vessel that gives rise to several obtuse marginal branches. No obstructive disease noted.  Right Coronary Artery: Large, dominant vessel with 30% eccentric stenosis in the distal vessel. The PDA is large in caliber and has mild plaque disease.  Left Ventricular Angiogram: LVEF 50-55%. Mild MR.   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 This was originally discovered by his primary cardiologist, Dr.  Andee Lineman, who referred him to [facility] in December for further evaluation and possible treatment. I originally saw him in consultation on 06/26/2012 during that hospitalization. He underwent an extensive evaluation during that hospitalization which has been fairly well documented in the discharge summary dated 07/15/2012. During his hospitalization he was cared for by specialist from the cardiology, infectious disease, and dental service teams. He underwent dental extraction as well. The patient has remained clinically stable without any signs of congestive heart failure and followup transesophageal echocardiogram performed during his hospitalization revealed no significant mitral regurgitation with stable appearance of the vegetation on the mitral valve. Cultures of all remaining negative and the patient never displayed any signs of distal embolization. A decision was made to continue to treat him medically without surgical intervention unless he were to develop signs of congestive heart failure, embolization or recurrent/persistent infection after medical therapy. The patient was ultimately discharged from the hospital on Zyvox for > 6 weeks which he completed more than 1 week ago. He was last seen by Dr. Cornelius Moras on 08/11/2011 just before he stopped his antibiotics, at which time he remained clinically stable. He was also seen in follow up by Dr Ninetta Lights from the Infectious Disease team as an outpatient on 08/12/2012. Since hospital discharge, he has also been seen in followup by his primary cardiologist, Dr. Andee Lineman. He underwent followup transesophageal echocardiogram earlier this week. By report, this demonstrated what is felt to be a perforation in his mitral valve with increased size of the vegetation on the mitral valve. The patient returned to our office for followup on 08/26/2012.  Mr. Patriarca reported chronic,stable symptoms of exertional shortness of breath, functional class II. He also reported  recent onset of bilateral shoulder pain described as "tightness" that seems to come and go sporadically. The symptoms were not necessarily  related to physical activity and are not necessarily associated with any change and shortness of breath. He denied any fevers chills or diaphoresis. He did report increased bilateral lower extremity edema. His appetite hs been stable. He denied any transient visual disturbances or transient numbness or weakness involving either upper or lower extremity. On arrival to the office, he was noted to have a low-grade fever.There were no other explanations for his fever, and under the circumstances Dr. Cornelius Moras felt that we must presume that he has persistent endocarditis until proven otherwise. Finally, the patient is having atypical chest pain which needs to be sorted out. A long discussion was had with the patient and his wife regarding the need for hospital admission for further evaluation and treatment. He agreed and was admitted to [facility] on 08/26/2012.   Brief Hospital Course:  He remained afebrile and WBC "within normal limits". He was not started on antibiotics. Troponins remained negative. He underwent a cardiac catheterization by Dr. Clifton James on 08/28/2012. He was not found to have significant coronary disease.Per Dr. Cornelius Moras, he was not at all convinced that this mass has increased in size in comparison with how it looked on previous TEE's. Moreover, he was not completely convinced that the mass is a vegetation or that the patient ever had endocarditis. Even if he did have endocarditis, there have never been any signs of embolization, there are no current signs of ongoing persistent infection, and there remains only mild mitral regurgitation with no significant CHF. Although mitral valve repair would likely be reasonably straightforward, the risks of surgery are not trivial given the patient's comorbidities. Under the circumstances, as long as Mr Cude's blood cultures  remain negative and he does not develop increased mitral regurgitation, signs of embolization or signs of infection,he was disinclined to proceed with surgery.Blood cultures were obtained and final results are not available yet. The patient had no further chest pain, he was tolerating a diet, and ambulating without difficulties. He was felt stable for discharge on 08/29/2012.  Latest Vital Signs: Blood pressure 160/65, pulse 62, temperature 97.5 F (36.4 C), temperature source Oral, resp. rate 16, height 5\' 10"  (1.778 m), weight 133.6 kg (294 lb 8.6 oz), SpO2 99.00%.  Physical Exam: Cardiovascular: RRR (distant heart sounds)  Pulmonary: Clear to auscultation bilaterally; no rales, wheezes, or rhonchi.  Abdomen: Soft, non tender, bowel sounds present.   Discharge Condition:Stable  Recent laboratory studies:  Lab Results  Component Value Date   WBC 4.3 08/29/2012   HGB 11.6* 08/29/2012   HCT 35.9* 08/29/2012   MCV 96.5 08/29/2012   PLT 150 08/29/2012   Lab Results  Component Value Date   NA 139 08/29/2012   K 4.3 08/29/2012   CL 103 08/29/2012   CO2 28 08/29/2012   CREATININE 0.86 08/29/2012   GLUCOSE 184* 08/29/2012      Diagnostic Studies: Dg Chest 2 View  08/26/2012  *RADIOLOGY REPORT*  Clinical Data: Rule out CHF  CHEST - 2 VIEW  Comparison: Prior chest x-ray 06/30/2012  Findings: Stable cardiomegaly and pulmonary vascular congestion without overt edema.  No focal airspace consolidation.  No pleural effusion or pneumothorax.  No acute osseous abnormality.  Pulmonary hyperexpansion and central bronchitic changes are unchanged compared to prior.  Bridging anterior syndesmophytes throughout the thoracic spine.  IMPRESSION:  1.  Stable cardiomegaly and pulmonary vascular congestion without overt edema to suggest CHF. 2.  Stable background changes of COPD. 3.  Bridging anterior syndesmophytes throughout the thoracic spine suggestive of ankylosing  spondylosis.  Recommend clinical correlation.    Original Report Authenticated By: Malachy Moan, M.D.         Future Appointments Provider Department Dept Phone   09/14/2012 10:00 AM Ginnie Smart, MD [facility] [phone]      Discharge Medications:   Medication List    STOP taking these medications       linezolid 600 MG tablet  Commonly known as:  ZYVOX      TAKE these medications       amLODipine 5 MG tablet  Commonly known as:  NORVASC  Take 1 tablet (5 mg total) by mouth daily.     amoxicillin 500 MG capsule  Commonly known as:  AMOXIL  Take four capsules one hour before dental appointment.     aspirin EC 81 MG tablet  Take 81 mg by mouth daily.     FISH OIL PO  Take 1 capsule by mouth daily.     gabapentin 100 MG capsule  Commonly known as:  NEURONTIN  Take 1 capsule (100 mg total) by mouth 3 (three) times daily.     glipiZIDE 10 MG tablet  Commonly known as:  GLUCOTROL  Take 1 tablet (10 mg total) by mouth daily before breakfast.     insulin aspart 100 UNIT/ML injection  Commonly known as:  novoLOG  Inject 0-15 Units into the skin 3 (three) times daily with meals.     insulin glargine 100 UNIT/ML injection  Commonly known as:  LANTUS  Inject 20 Units into the skin at bedtime.     metoprolol tartrate 25 MG tablet  Commonly known as:  LOPRESSOR  Take 1 tablet (25 mg total) by mouth 2 (two) times daily.     multivitamin with minerals Tabs  Take 1 tablet by mouth daily.     nitroGLYCERIN 0.4 MG SL tablet  Commonly known as:  NITROSTAT  Place 1 tablet (0.4 mg total) under the tongue every 5 (five) minutes x 3 doses as needed for chest pain.     sertraline 100 MG tablet  Commonly known as:  ZOLOFT  Take 100 mg by mouth daily.     traZODone 100 MG tablet  Commonly known as:  DESYREL  Take 100 mg by mouth at bedtime.        Follow Up Appointments:     Follow-up Information   Follow up with Peyton Bottoms, MD. (Call for a follow up  appointment)    Contact information:   [address] [place] [place] [address] [phone]       Follow up with Purcell Nails, MD. (Office will call with an appointment date and time)    Contact information:   [address] [place] [address] [phone]       Follow up with Cliffton Asters, MD. (Call for a follow up appointment for 2 weeks)    Contact information:   [address] [place] [address] [phone]       Signed: Doree Fudge MPA-C 08/29/2012, 12:02 PM

## 2012-08-30 ENCOUNTER — Other Ambulatory Visit: Payer: Self-pay | Admitting: Thoracic Surgery (Cardiothoracic Vascular Surgery)

## 2012-08-30 ENCOUNTER — Encounter: Payer: Self-pay | Admitting: Thoracic Surgery (Cardiothoracic Vascular Surgery)

## 2012-08-30 DIAGNOSIS — N390 Urinary tract infection, site not specified: Secondary | ICD-10-CM | POA: Insufficient documentation

## 2012-08-30 HISTORY — DX: Urinary tract infection, site not specified: N39.0

## 2012-08-30 LAB — URINE CULTURE: Colony Count: >=100000

## 2012-08-30 MED ORDER — CIPROFLOXACIN HCL 500 MG PO TABS: 500.0000 mg | ORAL_TABLET | Freq: Two times a day (BID) | ORAL | Status: DC

## 2012-08-30 NOTE — Discharge Summary (Signed)
Final urine culture + for Proteus mirabilis - patient given a prescription for Ciprofloxacin x 7 days  OWEN,CLARENCE H

## 2012-08-30 NOTE — Progress Notes (Signed)
Ciprofloxacin ordered for UTI - prescription sent to [facility] pharmacy in [facility]

## 2012-08-30 NOTE — Progress Notes (Signed)
Ciprofloxacin ordered to treat urinary tract infection  Cody Hale

## 2012-08-31 ENCOUNTER — Telehealth: Payer: Self-pay | Admitting: *Deleted

## 2012-08-31 ENCOUNTER — Other Ambulatory Visit: Payer: Self-pay

## 2012-08-31 NOTE — Telephone Encounter (Signed)
I called Mrs. Kuba to inform her that Dr. Cornelius Moras presrcibed an antibiotic for Mr. Bethel for his urinary tract infection.  It is ready for pick up at his pharmacy.  She understood that it should be started today.\

## 2012-09-01 DIAGNOSIS — Z8619 Personal history of other infectious and parasitic diseases: Secondary | ICD-10-CM | POA: Insufficient documentation

## 2012-09-02 LAB — CULTURE, BLOOD (SINGLE): Culture: NO GROWTH

## 2012-09-02 LAB — CULTURE, BLOOD (ROUTINE X 2)
Culture: NO GROWTH
Culture: NO GROWTH

## 2012-09-03 LAB — CULTURE, BLOOD (ROUTINE X 2)
Culture: NO GROWTH
Culture: NO GROWTH

## 2012-09-05 DIAGNOSIS — Z952 Presence of prosthetic heart valve: Secondary | ICD-10-CM

## 2012-09-05 HISTORY — DX: Presence of prosthetic heart valve: Z95.2

## 2012-09-07 ENCOUNTER — Ambulatory Visit: Payer: Self-pay | Admitting: Thoracic Surgery (Cardiothoracic Vascular Surgery)

## 2012-09-07 HISTORY — PX: MITRAL VALVE REPLACEMENT: SHX147

## 2012-09-14 ENCOUNTER — Ambulatory Visit: Payer: Self-pay | Admitting: Infectious Diseases

## 2012-10-29 DIAGNOSIS — E781 Pure hyperglyceridemia: Secondary | ICD-10-CM

## 2012-11-04 ENCOUNTER — Encounter: Payer: Self-pay | Admitting: Cardiology

## 2012-11-04 ENCOUNTER — Ambulatory Visit (INDEPENDENT_AMBULATORY_CARE_PROVIDER_SITE_OTHER): Payer: Self-pay | Admitting: Cardiology

## 2012-11-04 VITALS — BP 117/70 | HR 93 | Ht 70.0 in | Wt 282.0 lb

## 2012-11-04 DIAGNOSIS — Z7689 Persons encountering health services in other specified circumstances: Secondary | ICD-10-CM

## 2012-11-04 DIAGNOSIS — I059 Rheumatic mitral valve disease, unspecified: Secondary | ICD-10-CM

## 2012-11-04 DIAGNOSIS — I498 Other specified cardiac arrhythmias: Secondary | ICD-10-CM

## 2012-11-04 DIAGNOSIS — Z7189 Other specified counseling: Secondary | ICD-10-CM

## 2012-11-04 DIAGNOSIS — I471 Supraventricular tachycardia: Secondary | ICD-10-CM

## 2012-11-04 DIAGNOSIS — I447 Left bundle-branch block, unspecified: Secondary | ICD-10-CM

## 2012-11-04 DIAGNOSIS — I340 Nonrheumatic mitral (valve) insufficiency: Secondary | ICD-10-CM

## 2012-11-04 MED ORDER — FUROSEMIDE 40 MG PO TABS: 40.0000 mg | ORAL_TABLET | Freq: Every day | ORAL | Status: DC
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
## 2012-11-04 NOTE — Patient Instructions (Addendum)
Please take Furosemide 40 mg a day for 2 days Continue all other medications as listed  Please keep your feet elevated above the level of your heart as much as possible.  You will be contacted with an appointment to establish with a primary care doctor at [facility].  Follow up with Dr Rollene Rotunda in 2 months.

## 2012-11-05 HISTORY — PX: TRANSTHORACIC ECHOCARDIOGRAM: SHX275

## 2012-11-10 ENCOUNTER — Telehealth: Payer: Self-pay | Admitting: Cardiology

## 2012-11-10 NOTE — Telephone Encounter (Deleted)
error 

## 2012-11-12 ENCOUNTER — Ambulatory Visit (INDEPENDENT_AMBULATORY_CARE_PROVIDER_SITE_OTHER): Payer: BC Managed Care – PPO | Admitting: Family Medicine

## 2012-11-12 ENCOUNTER — Encounter: Payer: Self-pay | Admitting: Family Medicine

## 2012-11-12 VITALS — BP 133/75 | HR 74 | Temp 98.2°F | Resp 18 | Ht 69.0 in | Wt 277.8 lb

## 2012-11-12 DIAGNOSIS — E119 Type 2 diabetes mellitus without complications: Secondary | ICD-10-CM

## 2012-11-12 DIAGNOSIS — G4733 Obstructive sleep apnea (adult) (pediatric): Secondary | ICD-10-CM

## 2012-11-12 MED ORDER — TRAZODONE HCL 100 MG PO TABS: 100.0000 mg | ORAL_TABLET | Freq: Every day | ORAL | Status: DC

## 2012-11-16 ENCOUNTER — Encounter: Payer: Self-pay | Admitting: Family Medicine

## 2012-11-16 DIAGNOSIS — G4733 Obstructive sleep apnea (adult) (pediatric): Secondary | ICD-10-CM | POA: Insufficient documentation

## 2012-11-16 NOTE — Assessment & Plan Note (Signed)
Lab Results  Component Value Date   HGBA1C 7.2* 08/26/2012   Control stable as per home readings lately. Continue current meds, stick to the diet, exercise. Will do HbA1c next diabetic f/u in 4 mo.

## 2012-11-16 NOTE — Assessment & Plan Note (Signed)
Refer to pulmonology for further e/m of possible OSA.
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
## 2012-11-24 ENCOUNTER — Encounter (HOSPITAL_COMMUNITY): Payer: Self-pay

## 2012-11-24 ENCOUNTER — Encounter (HOSPITAL_COMMUNITY)
Admission: RE | Admit: 2012-11-24 | Discharge: 2012-11-24 | Disposition: A | Discharge status: Home / Self Care | Payer: BC Managed Care – PPO | Source: Ambulatory Visit | Attending: Cardiology | Admitting: Cardiology

## 2012-11-24 VITALS — BP 138/60 | HR 65 | Ht 69.0 in | Wt 273.3 lb

## 2012-11-24 DIAGNOSIS — Z954 Presence of other heart-valve replacement: Secondary | ICD-10-CM

## 2012-11-24 NOTE — Progress Notes (Signed)
Patient was referred by Dr. Nevada Crane due to Mitral Valve Replacement V43.3. During orientation advised patient on arrival and appointment times what to wear, what to do before, during and after exercise. Reviewed attendance and class policy. Talked about inclement weather and class consultation policy. Pt is scheduled to start Cardiac Rehab on 12/02/12 at 9:30. Pt was advised to come to class 5 minutes before class starts. He was also given instructions on meeting with the dietician and attending the Family Structure classes. Pt is eager to get started. Patient was able to complete 6 minute pre-walk test.

## 2012-11-24 NOTE — Patient Instructions (Signed)
Pt has finished orientation and is scheduled to start CR on 12/02/12 at 9:30 am. Pt has been instructed to arrive to class 15 minutes early for scheduled class. Pt has been instructed to wear comfortable clothing and shoes with rubber soles. Pt has been told to take their medications 1 hour prior to coming to class.  If the patient is not going to attend class, he/she has been instructed to call.

## 2012-11-29 ENCOUNTER — Encounter (HOSPITAL_COMMUNITY): Payer: Self-pay | Admitting: Emergency Medicine

## 2012-11-29 ENCOUNTER — Emergency Department (HOSPITAL_COMMUNITY): Payer: BC Managed Care – PPO

## 2012-11-29 ENCOUNTER — Inpatient Hospital Stay (HOSPITAL_COMMUNITY)
Admission: EM | Admit: 2012-11-29 | Discharge: 2012-12-04 | DRG: 127 | Disposition: A | Discharge status: Home Health | Payer: BC Managed Care – PPO | Attending: Family Medicine | Admitting: Family Medicine

## 2012-11-29 DIAGNOSIS — E119 Type 2 diabetes mellitus without complications: Secondary | ICD-10-CM

## 2012-11-29 DIAGNOSIS — F3289 Other specified depressive episodes: Secondary | ICD-10-CM | POA: Diagnosis present

## 2012-11-29 DIAGNOSIS — R112 Nausea with vomiting, unspecified: Secondary | ICD-10-CM

## 2012-11-29 DIAGNOSIS — Z79899 Other long term (current) drug therapy: Secondary | ICD-10-CM

## 2012-11-29 DIAGNOSIS — Z952 Presence of prosthetic heart valve: Secondary | ICD-10-CM

## 2012-11-29 DIAGNOSIS — I10 Essential (primary) hypertension: Secondary | ICD-10-CM | POA: Diagnosis present

## 2012-11-29 DIAGNOSIS — K3184 Gastroparesis: Secondary | ICD-10-CM | POA: Diagnosis present

## 2012-11-29 DIAGNOSIS — I5023 Acute on chronic systolic (congestive) heart failure: Secondary | ICD-10-CM

## 2012-11-29 DIAGNOSIS — I38 Endocarditis, valve unspecified: Secondary | ICD-10-CM | POA: Diagnosis present

## 2012-11-29 DIAGNOSIS — I509 Heart failure, unspecified: Secondary | ICD-10-CM

## 2012-11-29 DIAGNOSIS — I059 Rheumatic mitral valve disease, unspecified: Secondary | ICD-10-CM | POA: Diagnosis present

## 2012-11-29 DIAGNOSIS — E1149 Type 2 diabetes mellitus with other diabetic neurological complication: Secondary | ICD-10-CM | POA: Diagnosis present

## 2012-11-29 DIAGNOSIS — M1990 Unspecified osteoarthritis, unspecified site: Secondary | ICD-10-CM | POA: Diagnosis present

## 2012-11-29 DIAGNOSIS — G4700 Insomnia, unspecified: Secondary | ICD-10-CM | POA: Diagnosis present

## 2012-11-29 DIAGNOSIS — I5021 Acute systolic (congestive) heart failure: Secondary | ICD-10-CM

## 2012-11-29 DIAGNOSIS — Z9119 Patient's noncompliance with other medical treatment and regimen: Secondary | ICD-10-CM

## 2012-11-29 DIAGNOSIS — I340 Nonrheumatic mitral (valve) insufficiency: Secondary | ICD-10-CM

## 2012-11-29 DIAGNOSIS — A938 Other specified arthropod-borne viral fevers: Secondary | ICD-10-CM | POA: Diagnosis present

## 2012-11-29 DIAGNOSIS — E118 Type 2 diabetes mellitus with unspecified complications: Secondary | ICD-10-CM | POA: Diagnosis present

## 2012-11-29 DIAGNOSIS — E1143 Type 2 diabetes mellitus with diabetic autonomic (poly)neuropathy: Secondary | ICD-10-CM | POA: Diagnosis present

## 2012-11-29 DIAGNOSIS — F32A Depression, unspecified: Secondary | ICD-10-CM

## 2012-11-29 DIAGNOSIS — I4891 Unspecified atrial fibrillation: Secondary | ICD-10-CM | POA: Diagnosis present

## 2012-11-29 DIAGNOSIS — F329 Major depressive disorder, single episode, unspecified: Secondary | ICD-10-CM | POA: Diagnosis present

## 2012-11-29 DIAGNOSIS — I5041 Acute combined systolic (congestive) and diastolic (congestive) heart failure: Principal | ICD-10-CM | POA: Diagnosis present

## 2012-11-29 DIAGNOSIS — W57XXXA Bitten or stung by nonvenomous insect and other nonvenomous arthropods, initial encounter: Secondary | ICD-10-CM | POA: Diagnosis present

## 2012-11-29 DIAGNOSIS — Z91199 Patient's noncompliance with other medical treatment and regimen due to unspecified reason: Secondary | ICD-10-CM

## 2012-11-29 DIAGNOSIS — Z6839 Body mass index (BMI) 39.0-39.9, adult: Secondary | ICD-10-CM

## 2012-11-29 DIAGNOSIS — Z87891 Personal history of nicotine dependence: Secondary | ICD-10-CM

## 2012-11-29 DIAGNOSIS — I5031 Acute diastolic (congestive) heart failure: Secondary | ICD-10-CM | POA: Diagnosis present

## 2012-11-29 DIAGNOSIS — I447 Left bundle-branch block, unspecified: Secondary | ICD-10-CM | POA: Diagnosis present

## 2012-11-29 HISTORY — DX: Unspecified osteoarthritis, unspecified site: M19.90

## 2012-11-29 HISTORY — DX: Type 2 diabetes mellitus without complications: E11.9

## 2012-11-29 HISTORY — DX: Calculus of kidney: N20.0

## 2012-11-29 LAB — GLUCOSE, CAPILLARY: Glucose-Capillary: 213 mg/dL — ABNORMAL HIGH (ref 70–99)

## 2012-11-29 MED ORDER — METOCLOPRAMIDE HCL 5 MG/ML IJ SOLN
10.0000 mg | Freq: Once | INTRAMUSCULAR | Status: AC
  Administered 2012-11-29: 10 mg via INTRAVENOUS
  Filled 2012-11-29: qty 2

## 2012-11-29 NOTE — ED Notes (Signed)
Patient complaining of vomiting starting today at 1830. States this is the 3rd time this has happened since March. Was seen at [facility] 1 month ago for same and was told he had gastroparesis. Denies pain.
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
## 2012-11-30 ENCOUNTER — Encounter (HOSPITAL_COMMUNITY): Payer: Self-pay | Admitting: Internal Medicine

## 2012-11-30 DIAGNOSIS — I5031 Acute diastolic (congestive) heart failure: Secondary | ICD-10-CM | POA: Diagnosis present

## 2012-11-30 DIAGNOSIS — K3184 Gastroparesis: Secondary | ICD-10-CM

## 2012-11-30 DIAGNOSIS — E119 Type 2 diabetes mellitus without complications: Secondary | ICD-10-CM

## 2012-11-30 DIAGNOSIS — W57XXXA Bitten or stung by nonvenomous insect and other nonvenomous arthropods, initial encounter: Secondary | ICD-10-CM | POA: Diagnosis present

## 2012-11-30 DIAGNOSIS — E1149 Type 2 diabetes mellitus with other diabetic neurological complication: Secondary | ICD-10-CM

## 2012-11-30 DIAGNOSIS — R112 Nausea with vomiting, unspecified: Secondary | ICD-10-CM | POA: Diagnosis present

## 2012-11-30 DIAGNOSIS — Z952 Presence of prosthetic heart valve: Secondary | ICD-10-CM

## 2012-11-30 DIAGNOSIS — E1143 Type 2 diabetes mellitus with diabetic autonomic (poly)neuropathy: Secondary | ICD-10-CM | POA: Diagnosis present

## 2012-11-30 LAB — CBC WITH DIFFERENTIAL/PLATELET
Basophils Absolute: 0 10*3/uL (ref 0.0–0.1)
Basophils Relative: 0 % (ref 0–1)
Eosinophils Absolute: 0 10*3/uL (ref 0.0–0.7)
Eosinophils Relative: 0 % (ref 0–5)
HCT: 42.8 % (ref 39.0–52.0)
Hemoglobin: 13.3 g/dL (ref 13.0–17.0)
Lymphocytes Relative: 5 % — ABNORMAL LOW (ref 12–46)
Lymphs Abs: 0.4 10*3/uL — ABNORMAL LOW (ref 0.7–4.0)
MCH: 26.5 pg (ref 26.0–34.0)
MCHC: 31.1 g/dL (ref 30.0–36.0)
MCV: 85.4 fL (ref 78.0–100.0)
Monocytes Absolute: 0.2 10*3/uL (ref 0.1–1.0)
Monocytes Relative: 2 % — ABNORMAL LOW (ref 3–12)
Neutro Abs: 7.6 10*3/uL (ref 1.7–7.7)
Neutrophils Relative %: 93 % — ABNORMAL HIGH (ref 43–77)
Platelets: 179 10*3/uL (ref 150–400)
RBC: 5.01 MIL/uL (ref 4.22–5.81)
RDW: 15.4 % (ref 11.5–15.5)
WBC: 8.2 10*3/uL (ref 4.0–10.5)

## 2012-11-30 LAB — GLUCOSE, CAPILLARY
Glucose-Capillary: 198 mg/dL — ABNORMAL HIGH (ref 70–99)
Glucose-Capillary: 176 mg/dL — ABNORMAL HIGH (ref 70–99)
Glucose-Capillary: 87 mg/dL (ref 70–99)
Glucose-Capillary: 107 mg/dL — ABNORMAL HIGH (ref 70–99)

## 2012-11-30 LAB — TROPONIN I
Troponin I: <0.3 ng/mL (ref <0.30)
Troponin I: <0.3 ng/mL (ref <0.30)
Troponin I: <0.3 ng/mL (ref <0.30)
Troponin I: <0.3 ng/mL (ref <0.30)

## 2012-11-30 LAB — COMPREHENSIVE METABOLIC PANEL
ALT: 14 U/L (ref 0–53)
AST: 15 U/L (ref 0–37)
Albumin: 3.9 g/dL (ref 3.5–5.2)
Alkaline Phosphatase: 105 U/L (ref 39–117)
BUN: 14 mg/dL (ref 6–23)
CO2: 26 mEq/L (ref 19–32)
Calcium: 9.5 mg/dL (ref 8.4–10.5)
Chloride: 99 mEq/L (ref 96–112)
Creatinine, Ser: 0.94 mg/dL (ref 0.50–1.35)
GFR calc Af Amer: >90 mL/min (ref >90)
GFR calc non Af Amer: 88 mL/min — ABNORMAL LOW (ref >90)
Glucose, Bld: 230 mg/dL — ABNORMAL HIGH (ref 70–99)
Potassium: 4.4 mEq/L (ref 3.5–5.1)
Sodium: 137 mEq/L (ref 135–145)
Total Bilirubin: 0.3 mg/dL (ref 0.3–1.2)
Total Protein: 7.7 g/dL (ref 6.0–8.3)

## 2012-11-30 LAB — TSH: TSH: 2.259 u[IU]/mL (ref 0.350–4.500)

## 2012-11-30 LAB — URINALYSIS, ROUTINE W REFLEX MICROSCOPIC
Bilirubin Urine: NEGATIVE
Glucose, UA: NEGATIVE mg/dL
Hgb urine dipstick: NEGATIVE
Ketones, ur: 15 mg/dL — AB
Leukocytes, UA: NEGATIVE
Nitrite: NEGATIVE
Protein, ur: NEGATIVE mg/dL
Specific Gravity, Urine: >1.03 — ABNORMAL HIGH (ref 1.005–1.030)
Urobilinogen, UA: 0.2 mg/dL (ref 0.0–1.0)
pH: 6 (ref 5.0–8.0)

## 2012-11-30 LAB — PRO B NATRIURETIC PEPTIDE: Pro B Natriuretic peptide (BNP): 1272 pg/mL — ABNORMAL HIGH (ref 0–125)

## 2012-11-30 LAB — LACTIC ACID, PLASMA: Lactic Acid, Venous: 1.1 mmol/L (ref 0.5–2.2)

## 2012-11-30 LAB — PROTIME-INR
INR: 1.13 (ref 0.00–1.49)
Prothrombin Time: 14.3 seconds (ref 11.6–15.2)

## 2012-11-30 LAB — LIPASE, BLOOD: Lipase: 13 U/L (ref 11–59)

## 2012-11-30 MED ORDER — SERTRALINE HCL 50 MG PO TABS
100.0000 mg | ORAL_TABLET | Freq: Every day | ORAL | Status: DC
  Administered 2012-11-30 – 2012-12-04 (×5): 100 mg via ORAL
  Filled 2012-11-30 (×6): qty 2

## 2012-11-30 MED ORDER — SODIUM CHLORIDE 0.9 % IJ SOLN
3.0000 mL | INTRAMUSCULAR | Status: DC | PRN
  Administered 2012-11-30: 3 mL via INTRAVENOUS

## 2012-11-30 MED ORDER — ONDANSETRON HCL 4 MG/2ML IJ SOLN
4.0000 mg | Freq: Once | INTRAMUSCULAR | Status: AC
  Administered 2012-11-30: 4 mg via INTRAVENOUS
  Filled 2012-11-30: qty 2

## 2012-11-30 MED ORDER — SODIUM CHLORIDE 0.9 % IJ SOLN
3.0000 mL | Freq: Two times a day (BID) | INTRAMUSCULAR | Status: DC
  Administered 2012-11-30 – 2012-12-04 (×9): 3 mL via INTRAVENOUS

## 2012-11-30 MED ORDER — INSULIN ASPART 100 UNIT/ML ~~LOC~~ SOLN
0.0000 [IU] | Freq: Three times a day (TID) | SUBCUTANEOUS | Status: DC
  Administered 2012-11-30: 3 [IU] via SUBCUTANEOUS
  Administered 2012-11-30: 12:00:00 via SUBCUTANEOUS
  Administered 2012-12-02: 3 [IU] via SUBCUTANEOUS
  Administered 2012-12-02 (×2): 2 [IU] via SUBCUTANEOUS
  Administered 2012-12-03: 3 [IU] via SUBCUTANEOUS
  Administered 2012-12-03: 2 [IU] via SUBCUTANEOUS
  Administered 2012-12-03 – 2012-12-04 (×2): 3 [IU] via SUBCUTANEOUS

## 2012-11-30 MED ORDER — SODIUM CHLORIDE 0.9 % IV SOLN
250.0000 mL | INTRAVENOUS | Status: DC | PRN
  Administered 2012-11-30: 250 mL via INTRAVENOUS

## 2012-11-30 MED ORDER — NITROGLYCERIN 2 % TD OINT
1.0000 [in_us] | TOPICAL_OINTMENT | Freq: Four times a day (QID) | TRANSDERMAL | Status: DC
  Administered 2012-11-30 – 2012-12-01 (×5): 1 [in_us] via TOPICAL
  Filled 2012-11-30 (×5): qty 1

## 2012-11-30 MED ORDER — HYDRALAZINE HCL 20 MG/ML IJ SOLN: 10.0000 mg | Freq: Four times a day (QID) | INTRAMUSCULAR | Status: DC | PRN

## 2012-11-30 MED ORDER — ASPIRIN EC 81 MG PO TBEC
162.0000 mg | DELAYED_RELEASE_TABLET | Freq: Every day | ORAL | Status: DC
  Administered 2012-11-30 – 2012-12-02 (×3): 162 mg via ORAL
  Filled 2012-11-30 (×3): qty 2

## 2012-11-30 MED ORDER — NITROGLYCERIN 2 % TD OINT
1.0000 [in_us] | TOPICAL_OINTMENT | Freq: Once | TRANSDERMAL | Status: AC
  Administered 2012-11-30: 1 [in_us] via TOPICAL
  Filled 2012-11-30 (×2): qty 1

## 2012-11-30 MED ORDER — TRAZODONE HCL 50 MG PO TABS
50.0000 mg | ORAL_TABLET | Freq: Every evening | ORAL | Status: DC | PRN
  Administered 2012-12-02 – 2012-12-03 (×2): 50 mg via ORAL
  Filled 2012-11-30 (×2): qty 1

## 2012-11-30 MED ORDER — PANTOPRAZOLE SODIUM 40 MG IV SOLR
40.0000 mg | Freq: Two times a day (BID) | INTRAVENOUS | Status: DC
  Administered 2012-11-30 – 2012-12-01 (×3): 40 mg via INTRAVENOUS
  Filled 2012-11-30 (×3): qty 40

## 2012-11-30 MED ORDER — AMIODARONE HCL 200 MG PO TABS
200.0000 mg | ORAL_TABLET | Freq: Every day | ORAL | Status: DC
  Administered 2012-11-30 – 2012-12-01 (×2): 200 mg via ORAL
  Filled 2012-11-30 (×2): qty 1

## 2012-11-30 MED ORDER — DOXYCYCLINE HYCLATE 100 MG PO TABS
100.0000 mg | ORAL_TABLET | Freq: Two times a day (BID) | ORAL | Status: DC
  Administered 2012-11-30 – 2012-12-04 (×10): 100 mg via ORAL
  Filled 2012-11-30 (×10): qty 1

## 2012-11-30 MED ORDER — METOCLOPRAMIDE HCL 5 MG/ML IJ SOLN
5.0000 mg | Freq: Four times a day (QID) | INTRAMUSCULAR | Status: DC
  Administered 2012-11-30 – 2012-12-01 (×5): 5 mg via INTRAVENOUS
  Filled 2012-11-30 (×5): qty 2

## 2012-11-30 MED ORDER — LISINOPRIL 5 MG PO TABS
5.0000 mg | ORAL_TABLET | Freq: Every day | ORAL | Status: DC
  Administered 2012-11-30 – 2012-12-01 (×2): 5 mg via ORAL
  Filled 2012-11-30 (×2): qty 1

## 2012-11-30 MED ORDER — ONDANSETRON HCL 4 MG/2ML IJ SOLN
4.0000 mg | Freq: Four times a day (QID) | INTRAMUSCULAR | Status: DC | PRN
  Administered 2012-11-30: 4 mg via INTRAVENOUS
  Filled 2012-11-30: qty 2

## 2012-11-30 MED ORDER — FUROSEMIDE 10 MG/ML IJ SOLN
40.0000 mg | Freq: Once | INTRAMUSCULAR | Status: AC
  Administered 2012-11-30: 40 mg via INTRAVENOUS
  Filled 2012-11-30: qty 4

## 2012-11-30 MED ORDER — FUROSEMIDE 10 MG/ML IJ SOLN
40.0000 mg | Freq: Two times a day (BID) | INTRAMUSCULAR | Status: DC
  Administered 2012-11-30 (×2): 40 mg via INTRAVENOUS
  Filled 2012-11-30 (×2): qty 4

## 2012-11-30 MED ORDER — LIVING BETTER WITH HEART FAILURE BOOK
Freq: Once | Status: AC
  Administered 2012-11-30: 14:00:00
  Filled 2012-11-30: qty 1

## 2012-11-30 MED ORDER — ATORVASTATIN CALCIUM 20 MG PO TABS
20.0000 mg | ORAL_TABLET | Freq: Every day | ORAL | Status: DC
  Administered 2012-11-30 – 2012-12-02 (×3): 20 mg via ORAL
  Filled 2012-11-30 (×3): qty 1

## 2012-11-30 MED ORDER — ENOXAPARIN SODIUM 40 MG/0.4ML ~~LOC~~ SOLN
40.0000 mg | SUBCUTANEOUS | Status: DC
  Administered 2012-11-30 – 2012-12-04 (×5): 40 mg via SUBCUTANEOUS
  Filled 2012-11-30 (×6): qty 0.4

## 2012-11-30 MED ORDER — ACETAMINOPHEN 325 MG PO TABS
650.0000 mg | ORAL_TABLET | ORAL | Status: DC | PRN
  Administered 2012-12-03: 650 mg via ORAL
  Filled 2012-11-30: qty 2
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
## 2012-11-30 NOTE — Progress Notes (Signed)
On admission skin assessment a tick was found attached to pt's right lower leg. Tick removed and area marked.

## 2012-12-01 ENCOUNTER — Encounter (HOSPITAL_COMMUNITY): Payer: Self-pay | Admitting: Cardiology

## 2012-12-01 DIAGNOSIS — I5031 Acute diastolic (congestive) heart failure: Secondary | ICD-10-CM

## 2012-12-01 DIAGNOSIS — I10 Essential (primary) hypertension: Secondary | ICD-10-CM | POA: Diagnosis present

## 2012-12-01 DIAGNOSIS — E118 Type 2 diabetes mellitus with unspecified complications: Secondary | ICD-10-CM | POA: Diagnosis present

## 2012-12-01 DIAGNOSIS — I517 Cardiomegaly: Secondary | ICD-10-CM

## 2012-12-01 DIAGNOSIS — Z952 Presence of prosthetic heart valve: Secondary | ICD-10-CM

## 2012-12-01 DIAGNOSIS — I509 Heart failure, unspecified: Secondary | ICD-10-CM

## 2012-12-01 LAB — COMPREHENSIVE METABOLIC PANEL
ALT: 12 U/L (ref 0–53)
AST: 14 U/L (ref 0–37)
Albumin: 3.8 g/dL (ref 3.5–5.2)
Alkaline Phosphatase: 90 U/L (ref 39–117)
BUN: 13 mg/dL (ref 6–23)
CO2: 37 mEq/L — ABNORMAL HIGH (ref 19–32)
Calcium: 9.5 mg/dL (ref 8.4–10.5)
Chloride: 99 mEq/L (ref 96–112)
Creatinine, Ser: 0.96 mg/dL (ref 0.50–1.35)
GFR calc Af Amer: >90 mL/min (ref >90)
GFR calc non Af Amer: 87 mL/min — ABNORMAL LOW (ref >90)
Glucose, Bld: 126 mg/dL — ABNORMAL HIGH (ref 70–99)
Potassium: 3.6 mEq/L (ref 3.5–5.1)
Sodium: 142 mEq/L (ref 135–145)
Total Bilirubin: 0.3 mg/dL (ref 0.3–1.2)
Total Protein: 7.2 g/dL (ref 6.0–8.3)

## 2012-12-01 LAB — CBC
HCT: 42.4 % (ref 39.0–52.0)
Hemoglobin: 12.9 g/dL — ABNORMAL LOW (ref 13.0–17.0)
MCH: 26.4 pg (ref 26.0–34.0)
MCHC: 30.4 g/dL (ref 30.0–36.0)
MCV: 86.9 fL (ref 78.0–100.0)
Platelets: 209 10*3/uL (ref 150–400)
RBC: 4.88 MIL/uL (ref 4.22–5.81)
RDW: 15.5 % (ref 11.5–15.5)
WBC: 10.9 10*3/uL — ABNORMAL HIGH (ref 4.0–10.5)

## 2012-12-01 LAB — GLUCOSE, CAPILLARY
Glucose-Capillary: 115 mg/dL — ABNORMAL HIGH (ref 70–99)
Glucose-Capillary: 110 mg/dL — ABNORMAL HIGH (ref 70–99)
Glucose-Capillary: 111 mg/dL — ABNORMAL HIGH (ref 70–99)
Glucose-Capillary: 154 mg/dL — ABNORMAL HIGH (ref 70–99)

## 2012-12-01 LAB — PRO B NATRIURETIC PEPTIDE: Pro B Natriuretic peptide (BNP): 1501 pg/mL — ABNORMAL HIGH (ref 0–125)

## 2012-12-01 MED ORDER — FUROSEMIDE 40 MG PO TABS: 40.0000 mg | ORAL_TABLET | Freq: Two times a day (BID) | ORAL | Status: DC

## 2012-12-01 MED ORDER — LISINOPRIL 10 MG PO TABS
10.0000 mg | ORAL_TABLET | Freq: Two times a day (BID) | ORAL | Status: DC
  Administered 2012-12-01 – 2012-12-02 (×2): 10 mg via ORAL
  Filled 2012-12-01 (×2): qty 1

## 2012-12-01 MED ORDER — METOCLOPRAMIDE HCL 10 MG PO TABS
5.0000 mg | ORAL_TABLET | Freq: Three times a day (TID) | ORAL | Status: DC
  Administered 2012-12-01 – 2012-12-04 (×10): 5 mg via ORAL
  Filled 2012-12-01 (×2): qty 1
  Filled 2012-12-01: qty 2
  Filled 2012-12-01 (×7): qty 1

## 2012-12-01 MED ORDER — PANTOPRAZOLE SODIUM 40 MG PO TBEC
40.0000 mg | DELAYED_RELEASE_TABLET | Freq: Two times a day (BID) | ORAL | Status: DC
  Administered 2012-12-01 – 2012-12-04 (×6): 40 mg via ORAL
  Filled 2012-12-01 (×6): qty 1

## 2012-12-01 MED ORDER — POTASSIUM CHLORIDE CRYS ER 20 MEQ PO TBCR
20.0000 meq | EXTENDED_RELEASE_TABLET | Freq: Two times a day (BID) | ORAL | Status: DC
  Administered 2012-12-01 – 2012-12-02 (×3): 20 meq via ORAL
  Filled 2012-12-01 (×3): qty 1

## 2012-12-01 MED ORDER — FUROSEMIDE 40 MG PO TABS
40.0000 mg | ORAL_TABLET | Freq: Every day | ORAL | Status: DC
  Administered 2012-12-01: 40 mg via ORAL
  Filled 2012-12-01: qty 1

## 2012-12-01 MED ORDER — FUROSEMIDE 40 MG PO TABS
40.0000 mg | ORAL_TABLET | Freq: Every day | ORAL | Status: DC
  Administered 2012-12-02 – 2012-12-04 (×3): 40 mg via ORAL
  Filled 2012-12-01 (×3): qty 1

## 2012-12-01 MED ORDER — NITROGLYCERIN 2 % TD OINT
0.2500 [in_us] | TOPICAL_OINTMENT | Freq: Four times a day (QID) | TRANSDERMAL | Status: DC
  Administered 2012-12-01 – 2012-12-02 (×4): 0.5 [in_us] via TOPICAL
  Filled 2012-12-01 (×4): qty 1

## 2012-12-01 MED ORDER — CARVEDILOL 3.125 MG PO TABS
6.2500 mg | ORAL_TABLET | Freq: Two times a day (BID) | ORAL | Status: DC
  Administered 2012-12-02: 6.25 mg via ORAL
  Filled 2012-12-01: qty 2

## 2012-12-01 NOTE — Plan of Care (Signed)
Problem: Consults Goal: Heart Failure Patient Education (See Patient Education module for education specifics.)  Outcome: Completed/Met Date Met:  12/01/12 Gave pt living with HF packet, discussed steps pt needs to take. Told pt to follow low sodium diet, and to try seasoning with natural herbs and seasonings that are salt free. Provided pt with scale and education on how and when to weigh. Pt verbalized understanding with teach back.
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
## 2012-12-01 NOTE — Care Management Note (Signed)
    Page 1 of 1   12/04/2012     1:46:25 PM   CARE MANAGEMENT NOTE 12/04/2012  Patient:  Cody Hale, Cody Hale   Account Number:  [id number]  Date Initiated:  12/01/2012  Documentation initiated by:  Sharrie Rothman  Subjective/Objective Assessment:   Pt admitted from home with CHF. Pt lives with his wife and will return at discharge. Pt is indpendent with ADL's.     Action/Plan:   No CM needs noted.   Anticipated DC Date:  12/03/2012   Anticipated DC Plan:  HOME/SELF CARE      DC Planning Services  CM consult      Choice offered to / List presented to:          [facility] arranged  HH-1 RN      [facility] agency  [facility]   Status of service:  Completed, signed off Medicare Important Message given?   (If response is "NO", the following Medicare IM given date fields will be blank) Date Medicare IM given:   Date Additional Medicare IM given:    Discharge Disposition:  HOME W HOME HEALTH SERVICES  Per UR Regulation:    If discussed at Long Length of Stay Meetings, dates discussed:    Comments:  12/04/12 1344 Arlyss Queen, RN BSN CM Pt discharged home today with [facility] RN. Alroy Bailiff of [facility] is aware and will collect pts information from the chart. HH services to start within 48 hours. No DME needs noted. Pt and pts nurse aware of discharge arrangements.  12/01/12 1510 Arlyss Queen, RN BSN CM

## 2012-12-01 NOTE — Progress Notes (Signed)
UR chart review completed.  

## 2012-12-01 NOTE — Progress Notes (Signed)
Dr. Sherrie Mustache paged regarding patient's request for diet change as he is now tolerating clear liquids. No c/o nausea/vomiting. New orders placed in computer for Carb Modified diet per Dr. Sherrie Mustache. Will make patient aware of changes.

## 2012-12-01 NOTE — Progress Notes (Signed)
*  PRELIMINARY RESULTS* Echocardiogram 2D Echocardiogram has been performed.  [place]  12/01/2012, 10:20 AM

## 2012-12-01 NOTE — Progress Notes (Signed)
The patient is receiving Protonix by the intravenous route.  Based on criteria approved by the Pharmacy and Therapeutics Committee and the Medical Executive Committee, the medication is being converted to the equivalent oral dose form.  These criteria include: -No Active GI bleeding -Able to tolerate diet of full liquids (or better) or tube feeding OR able to tolerate other medications by the oral or enteral route  If you have any questions about this conversion, please contact the Pharmacy Department (ext [phone]).  Thank you.  Elson Clan, [facility] 12/01/2012 12:06 PM

## 2012-12-01 NOTE — Progress Notes (Signed)
Subjective: The patient is breathing better. He has no chest pain or chest congestion. His nausea, vomiting, and abdominal discomfort has resolved. He wants his diet advanced. Yesterday, the nurse apparently found a tick on his right lower leg.  Objective: Vital signs in last 24 hours: Filed Vitals:   11/30/12 2009 11/30/12 2017 12/01/12 0358 12/01/12 0457  BP:  113/56  127/61  Pulse:  63  79  Temp:  97.8 F (36.6 C)  97.2 F (36.2 C)  TempSrc:  Oral  Oral  Resp:  18  17  Height:      Weight:   120.7 kg (266 lb 1.5 oz) 119.8 kg (264 lb 1.8 oz)  SpO2: 94% 99%  100%    Intake/Output Summary (Last 24 hours) at 12/01/12 1145 Last data filed at 12/01/12 1103  Gross per 24 hour  Intake      3 ml  Output   3475 ml  Net  -3472 ml    Weight change: -0.9 kg (-1 lb 15.8 oz)  Physical exam: General: Obese 62 year old Caucasian man sitting up in bed, in no acute distress. Lungs: Coarse breath sounds, but no audible wheezes or significant crackles. Heart: S1, S2, with a soft systolic murmur. Abdomen: Obese, positive bowel sounds, soft, nontender, nondistended. Extremities: Trace of pedal edema bilaterally. Mild erythema around tick bite on the right leg. No purulent drainage.  Lab Results: Basic Metabolic Panel:  Recent Labs  16/10/96 2336 12/01/12 0451  NA 137 142  K 4.4 3.6  CL 99 99  CO2 26 37*  GLUCOSE 230* 126*  BUN 14 13  CREATININE 0.94 0.96  CALCIUM 9.5 9.5   Liver Function Tests:  Recent Labs  11/29/12 2336 12/01/12 0451  AST 15 14  ALT 14 12  ALKPHOS 105 90  BILITOT 0.3 0.3  PROT 7.7 7.2  ALBUMIN 3.9 3.8    Recent Labs  11/29/12 2336  LIPASE 13   No results found for this basename: AMMONIA,  in the last 72 hours CBC:  Recent Labs  11/29/12 2336 12/01/12 0451  WBC 8.2 10.9*  NEUTROABS 7.6  --   HGB 13.3 12.9*  HCT 42.8 42.4  MCV 85.4 86.9  PLT 179 209   Cardiac Enzymes:  Recent Labs  11/30/12 0327 11/30/12 0847 11/30/12 1505   TROPONINI <0.30 <0.30 <0.30   BNP:  Recent Labs  11/30/12 0038 12/01/12 0451  PROBNP 1272.0* 1501.0*   D-Dimer: No results found for this basename: DDIMER,  in the last 72 hours CBG:  Recent Labs  11/30/12 0726 11/30/12 1144 11/30/12 1651 11/30/12 2206 12/01/12 0743 12/01/12 1139  GLUCAP 198* 176* 87 107* 115* 110*   Hemoglobin A1C: No results found for this basename: HGBA1C,  in the last 72 hours Fasting Lipid Panel: No results found for this basename: CHOL, HDL, LDLCALC, TRIG, CHOLHDL, LDLDIRECT,  in the last 72 hours Thyroid Function Tests:  Recent Labs  11/30/12 0327  TSH 2.259   Anemia Panel: No results found for this basename: VITAMINB12, FOLATE, FERRITIN, TIBC, IRON, RETICCTPCT,  in the last 72 hours Coagulation:  Recent Labs  11/30/12 0327  LABPROT 14.3  INR 1.13   Urine Drug Screen: Drugs of Abuse  No results found for this basename: labopia,  cocainscrnur,  labbenz,  amphetmu,  thcu,  labbarb    Alcohol Level: No results found for this basename: ETH,  in the last 72 hours Urinalysis:  Recent Labs  11/30/12 0101  COLORURINE YELLOW  LABSPEC >1.030*  PHURINE 6.0  GLUCOSEU NEGATIVE  HGBUR NEGATIVE  BILIRUBINUR NEGATIVE  KETONESUR 15*  PROTEINUR NEGATIVE  UROBILINOGEN 0.2  NITRITE NEGATIVE  LEUKOCYTESUR NEGATIVE   Misc. Labs:   Micro: No results found for this or any previous visit (from the past 240 hour(s)).  Studies/Results: Dg Chest Portable 1 View  11/30/2012   *RADIOLOGY REPORT*  Clinical Data: Cough  PORTABLE CHEST - 1 VIEW  Comparison: Fourth 32,014  Findings: Sternotomy wires overlie is mildly enlarged heart silhouette.  There is increased bibasilar air space disease suggesting pulmonary edema.  Upper lungs are clear.  No pneumothorax.  IMPRESSION: Bibasilar air space disease suggests pulmonary edema.   Original Report Authenticated By: Genevive Bi, M.D.    Medications:  Scheduled: . amiodarone  200 mg Oral Daily  .  aspirin EC  162 mg Oral Daily  . atorvastatin  20 mg Oral Daily  . doxycycline  100 mg Oral Q12H  . enoxaparin (LOVENOX) injection  40 mg Subcutaneous Q24H  . furosemide  40 mg Oral BID  . insulin aspart  0-15 Units Subcutaneous TID WC  . lisinopril  5 mg Oral Daily  . metoCLOPramide  5 mg Oral TID AC  . nitroGLYCERIN  0.5 inch Topical Q6H  . pantoprazole (PROTONIX) IV  40 mg Intravenous Q12H  . sertraline  100 mg Oral Daily  . sodium chloride  3 mL Intravenous Q12H   Continuous:  ZOX:WRUEAV chloride, acetaminophen, hydrALAZINE, ondansetron (ZOFRAN) IV, sodium chloride, traZODone  Assessment: Principal Problem:   Diastolic CHF, acute Active Problems:   Depression   Morbid obesity   Diabetic gastroparesis   Nausea and vomiting   Tick bite   Malignant hypertension   Diabetes mellitus, type II   Hypertension   Endocarditis-resolved   Status post mitral valve replacement   1. Acute diastolic heart failure. 2-D echocardiogram pending to assess LV, RV function, and diastolic function. His ins and outs are negative more than 4 L over the past 36 hours. His weight is down 4 pounds. His pro BNP is slightly more elevated, but this is nonspecific. He is symptomatically improved. He has less peripheral edema. His CO2 is elevated which may be secondary to contraction alkalosis. We'll change Lasix to 40 mg twice a day orally rather than IV. Will decrease nitroglycerin paste to half an inch and and probably discontinue in the morning. Continue ACE inhibitor. Await cardiology's input.  History of endocarditis, status post recent mitral valve replacement. 2-D echocardiogram pending for evaluation.  History of postoperative atrial fibrillation. He is in sinus rhythm per exam. Continue amiodarone as prescribed.  Malignant hypertension. His accelerated  blood pressure may have been secondary to his GI symptomatology or mild respiratory distress. His blood pressure is much more controlled. We'll  continue lisinopril as ordered and wean off nitroglycerin paste.  Acute hypoxic respiratory failure. Resolving. Oxygen saturation was found to be low in the ED. He is on oxygen supplementation and is oxygenating 100%.   Diabetic gastroparesis. Acute phase resolved, status post IV Reglan and relative bowel rest. Advance diet and change Reglan to by mouth.  Diabetes mellitus. Currently reasonable capillary blood glucose. Will continue sliding scale NovoLog and restart glipizide upon discharge.  Status post tick bite. Continue doxycycline empirically.     Plan:  1. Have changed Lasix to 40 mg twice a day secondary to mild contraction alkalosis. Will await cardiology's input. 2. Have change Reglan to by mouth and advance diet. 3. Change Protonix to by mouth. 4. Wean off  nitroglycerin paste tomorrow. 5. Cardiology consult pending. 6. Possible discharge tomorrow.   LOS: 2 days   Tuesday Terlecki 12/01/2012, 11:45 AM

## 2012-12-02 ENCOUNTER — Encounter (HOSPITAL_COMMUNITY): Payer: BC Managed Care – PPO

## 2012-12-02 ENCOUNTER — Inpatient Hospital Stay (HOSPITAL_COMMUNITY): Payer: BC Managed Care – PPO

## 2012-12-02 DIAGNOSIS — I5021 Acute systolic (congestive) heart failure: Secondary | ICD-10-CM

## 2012-12-02 DIAGNOSIS — Z954 Presence of other heart-valve replacement: Secondary | ICD-10-CM

## 2012-12-02 DIAGNOSIS — F329 Major depressive disorder, single episode, unspecified: Secondary | ICD-10-CM

## 2012-12-02 DIAGNOSIS — I5023 Acute on chronic systolic (congestive) heart failure: Secondary | ICD-10-CM

## 2012-12-02 LAB — GLUCOSE, CAPILLARY
Glucose-Capillary: 125 mg/dL — ABNORMAL HIGH (ref 70–99)
Glucose-Capillary: 121 mg/dL — ABNORMAL HIGH (ref 70–99)
Glucose-Capillary: 166 mg/dL — ABNORMAL HIGH (ref 70–99)
Glucose-Capillary: 235 mg/dL — ABNORMAL HIGH (ref 70–99)

## 2012-12-02 LAB — CBC
HCT: 39.7 % (ref 39.0–52.0)
Hemoglobin: 12.2 g/dL — ABNORMAL LOW (ref 13.0–17.0)
MCH: 26.6 pg (ref 26.0–34.0)
MCHC: 30.7 g/dL (ref 30.0–36.0)
MCV: 86.7 fL (ref 78.0–100.0)
Platelets: 194 10*3/uL (ref 150–400)
RBC: 4.58 MIL/uL (ref 4.22–5.81)
RDW: 15.5 % (ref 11.5–15.5)
WBC: 5.7 10*3/uL (ref 4.0–10.5)

## 2012-12-02 LAB — BASIC METABOLIC PANEL
BUN: 15 mg/dL (ref 6–23)
CO2: 37 mEq/L — ABNORMAL HIGH (ref 19–32)
Calcium: 9.5 mg/dL (ref 8.4–10.5)
Chloride: 96 mEq/L (ref 96–112)
Creatinine, Ser: 1.03 mg/dL (ref 0.50–1.35)
GFR calc Af Amer: 88 mL/min — ABNORMAL LOW (ref >90)
GFR calc non Af Amer: 76 mL/min — ABNORMAL LOW (ref >90)
Glucose, Bld: 120 mg/dL — ABNORMAL HIGH (ref 70–99)
Potassium: 3.8 mEq/L (ref 3.5–5.1)
Sodium: 139 mEq/L (ref 135–145)

## 2012-12-02 LAB — PRO B NATRIURETIC PEPTIDE: Pro B Natriuretic peptide (BNP): 512.9 pg/mL — ABNORMAL HIGH (ref 0–125)

## 2012-12-02 MED ORDER — SPIRONOLACTONE 25 MG PO TABS
25.0000 mg | ORAL_TABLET | Freq: Every day | ORAL | Status: DC
  Administered 2012-12-02 – 2012-12-04 (×3): 25 mg via ORAL
  Filled 2012-12-02 (×3): qty 1

## 2012-12-02 MED ORDER — LISINOPRIL 10 MG PO TABS
40.0000 mg | ORAL_TABLET | Freq: Every day | ORAL | Status: DC
  Administered 2012-12-03 – 2012-12-04 (×2): 40 mg via ORAL
  Filled 2012-12-02 (×2): qty 4

## 2012-12-02 MED ORDER — HYDRALAZINE HCL 10 MG PO TABS: 10.0000 mg | ORAL_TABLET | Freq: Three times a day (TID) | ORAL | Status: DC

## 2012-12-02 MED ORDER — CARVEDILOL 12.5 MG PO TABS
12.5000 mg | ORAL_TABLET | Freq: Two times a day (BID) | ORAL | Status: DC
  Administered 2012-12-02 – 2012-12-04 (×3): 12.5 mg via ORAL
  Filled 2012-12-02 (×3): qty 1

## 2012-12-02 NOTE — Progress Notes (Signed)
Pt's blood sugar is 235. Pt states he knows its because of the 2 sherberts that he has just eaten. Will continue to monitor.

## 2012-12-02 NOTE — Progress Notes (Addendum)
Pt's wife approached RN at nurses station stating "I'm worried about [facility].  He just told me he felt like he should just end it."  Spoke with patient at bedside regarding patient's concerns.  Pt states "I just feel like I've been through a lot these past few months. I'm just frustrated."  Spoke with patient about his concerns regarding new diagnosis of CHF and new dietary restrictions and how he felt that was affecting his mood.  Patient recognized that this as well as his mitral valve surgery back in March were contributing to what he feels is depression.  Patient encouraged regarding new diagnosis and management of symptoms.  Pt's wife stated that patient is normally on Zoloft and Trazadone at home. Wife recognizes that patient has not recently been taking Trazadone as they have been unable to afford the prescription.  Made pt and wife aware that RN would make Dr. Irene Limbo aware of the above concerns. Resources made available to patient including hospital chaplain.  Pt states he would rather talk to the doctor first.  Wife states patient's sister would be in this afternoon to see patient. Pt seemed excited about this.  Wife also states that she has left message with patient's pastor who she hopes will be by to see patient.   Pt stated he did not have any thoughts of harming or killing himself.  Emotional support given to patient and his wife at the bedside.  Dr. Irene Limbo made aware of patient's concerns. In to see patient at this time.

## 2012-12-02 NOTE — Progress Notes (Addendum)
[facility] PROGRESS NOTE  ABRAHAN FULMORE WUJ:[id number] DOB: 1950/12/22 DOA: 11/29/2012 PCP: Jeoffrey Massed, MD Cardiologist is Dr. Antoine Poche  Assessment/Plan: 1. Acute systolic heart failure. New systolic dysfunction noted. Medications being adjusted by cardiology. Clinically appears to be improving.  2. Diabetic gastroparesis with nausea, vomiting. Acute component resolved status post IV Reglan and relative bowel rest. Advance diet and continue Reglan  by mouth.  3. History of endocarditis, status post recent bioprosthetic mitral valve replacement.  3 months ago at [facility]. History of endocarditis. 4. History of postoperative atrial fibrillation. He is in sinus rhythm per exam. Amiodarone discontinued by cardiology. 5. Malignant hypertension. His accelerated blood pressure may have been secondary to his GI symptomatology or mild respiratory distress. Blood pressure now well-controlled. 6. Acute hypoxic respiratory failure. Resolving with treatment of acute issues. 7. Diabetes mellitus. Well controlled. Continue sliding scale NovoLog and restart glipizide upon discharge. Hemoglobin A1c 7.2. 8. Status post tick bite. Continue doxycycline empirically. No rash. 9. Depression, insomnia: Continue trazodone. Patient depressed. However there is no suicidal or homicidal ideation.   Wean oxygen  Anticipate discharge soon when cleared by cardiology  Code Status: Full code DVT prophylaxis: Enoxaparin Family Communication: Discussed with wife at bedside Disposition Plan: Home when improved  Brendia Sacks, MD  [facility]  Pager [phone] If 7PM-7AM, please contact night-coverage at [email or web address], password [facility] 12/02/2012, 12:51 PM  LOS: 3 days   Clinical Summary: 62 year old man with a past history of diabetes on oral agents, hypertension, mitral valve replacement in March of this year at [facility], who was in his usual state of health until 6:30 PM last  evening when he started having nausea, followed by multiple episodes of vomiting. He's had 8 episodes of vomiting without any blood. Denies any abdominal pain. Denies any fever or chills. Denies any dysuria. Denies any diarrhea. He, says he was diagnosed with diabetic gastroparesis and was prescribed Reglan. However, he hasn't filled his prescription yet. He was admitted to [facility] recently for similar issues. While he was being evaluated for his nausea and vomiting, he was found to be hypoxic. He was found to have lower extremity edema and on chest x-ray was found to have bilateral infiltrates, suggestive of pulmonary edema. Patient denies any shortness of breath or chest pain currently.  Consultants:  Cardiology  Procedures:  2-D echocardiogram: Left ventricular ejection fraction 35%. Severe hypokinesis and fair anterior, anteroseptal, apical myocardium. New since prior study  Antibiotics:  Doxycycline 5/26 >>  HPI/Subjective: Overall feels better. Breathing better. No nausea or vomiting. Weight without significant change. -5.5 L since admission.  Objective: Filed Vitals:   12/01/12 1400 12/01/12 2132 12/02/12 0500 12/02/12 0603  BP: 150/66 120/64  128/72  Pulse: 63 63  62  Temp: 97.8 F (36.6 C) 98.2 F (36.8 C)  98 F (36.7 C)  TempSrc: Oral Oral    Resp: 18 18  20   Height:      Weight:   120.5 kg (265 lb 10.5 oz)   SpO2: 96% 98%  97%    Intake/Output Summary (Last 24 hours) at 12/02/12 1251 Last data filed at 12/02/12 1237  Gross per 24 hour  Intake   1073 ml  Output   2625 ml  Net  -1552 ml     Filed Weights   12/01/12 0358 12/01/12 0457 12/02/12 0500  Weight: 120.7 kg (266 lb 1.5 oz) 119.8 kg (264 lb 1.8 oz) 120.5 kg (265 lb 10.5 oz)    Exam:  General:  Appears calm and comfortable Cardiovascular: RRR, no m/r/g. 1+ bilateral LE edema. Telemetry: SR, no arrhythmias  Respiratory: CTA bilaterally, no w/r/r. Normal respiratory effort. Skin: no rash  or induration seen right lower extremity Musculoskeletal: grossly normal tone BUE/BLE Psychiatric: grossly normal mood and affect, speech fluent and appropriate Neurologic: grossly non-focal.  Data Reviewed:  Capillary blood sugars well controlled. Basic metabolic panel unremarkable. Cardiac enzymes negative. BNP improved. CBC unremarkable. TSH normal. Repeat chest x-ray demonstrated improving interstitial edema.   Scheduled Meds: . carvedilol  12.5 mg Oral BID WC  . doxycycline  100 mg Oral Q12H  . enoxaparin (LOVENOX) injection  40 mg Subcutaneous Q24H  . furosemide  40 mg Oral Daily  . insulin aspart  0-15 Units Subcutaneous TID WC  . [START ON 12/03/2012] lisinopril  40 mg Oral Daily  . metoCLOPramide  5 mg Oral TID AC  . pantoprazole  40 mg Oral BID AC  . sertraline  100 mg Oral Daily  . sodium chloride  3 mL Intravenous Q12H  . spironolactone  25 mg Oral Daily   Continuous Infusions:   Principal Problem:   Acute systolic CHF (congestive heart failure) Active Problems:   Depression   Morbid obesity   Diastolic CHF, acute   Diabetic gastroparesis   Nausea and vomiting   Tick bite   Malignant hypertension   Diabetes mellitus, type II   Hypertension   Endocarditis-resolved   Status post mitral valve replacement   Time spent 25 minutes

## 2012-12-02 NOTE — Progress Notes (Signed)
SUBJECTIVE: No chest pain or dyspnea.  LABS: Basic Metabolic Panel:  Recent Labs  21/30/86 0451 12/02/12 0506  NA 142 139  K 3.6 3.8  CL 99 96  CO2 37* 37*  GLUCOSE 126* 120*  BUN 13 15  CREATININE 0.96 1.03  CALCIUM 9.5 9.5   Liver Function Tests:  Recent Labs  11/29/12 2336 12/01/12 0451  AST 15 14  ALT 14 12  ALKPHOS 105 90  BILITOT 0.3 0.3  PROT 7.7 7.2  ALBUMIN 3.9 3.8    Recent Labs  11/29/12 2336  LIPASE 13   CBC:  Recent Labs  11/29/12 2336 12/01/12 0451 12/02/12 0506  WBC 8.2 10.9* 5.7  NEUTROABS 7.6  --   --   HGB 13.3 12.9* 12.2*  HCT 42.8 42.4 39.7  MCV 85.4 86.9 86.7  PLT 179 209 194   Cardiac Enzymes:  Recent Labs  11/30/12 0327 11/30/12 0847 11/30/12 1505  TROPONINI <0.30 <0.30 <0.30   Thyroid Function Tests:  Recent Labs  11/30/12 0327  TSH 2.259   Echocardiogram: 12/01/12 LV: Mildly enlarged; mild to moderate LVH; EF-35% with severe hypokinesis of the inferior, inferoseptal and apical myocardium; paradoxic septal motion; normal function of mitral valve bioprosthesis; moderate LAE Comparison with previous study of 07/2012-LV dysfunction now present  Cardiac Cath 08/28/2012: 30% RCA; no significant disease in left main, LAD or   Chest 2 View  12/02/2012    Improvement in pulmonary edema pattern; small pleural effusions present.  No airspace consolidation.   PHYSICAL EXAM BP 128/72  Pulse 62  Temp(Src) 98 F (36.7 C) (Oral)  Resp 20  Ht 5\' 9"  (1.753 m)  Wt 265 lb 10.5 oz (120.5 kg)  BMI 39.21 kg/m2  SpO2 97%Wt loss 3 lbs since admission recordings. General: Well developed, well nourished, in no acute distress Head: Eyes PERRLA, No xanthomas.   Normal cephalic and atramatic  Lungs: Clear bilaterally to auscultation and percussion. Heart: HRRR S1 S2, No MRG .  Pulses are 2+ & equal.            No carotid bruit. No JVD.  No abdominal bruits. No femoral bruits. Abdomen: Bowel sounds are positive, abdomen with mild  distention and non-tender without masses or                  Hernia's noted. Msk:  Back normal, normal gait. Normal strength and tone for age. Extremities: No clubbing, cyanosis, 1+ ankle edema.  DP +1 Neuro: Alert and oriented X 3. Psych:  Good affect, responds appropriately  I/O: -1.3 L yesterday; pro BNP level-513  TELEMETRY: Reviewed telemetry pt in NSR with PVC rates in the 70's.   ASSESSMENT AND PLAN:  1. Systolic CHF: Echocardiogram demonstrates significantly diminished EF of 30-35% in comparison to prior he had a cardiac catheterization in February of 2014 which demonstrated normal EF. The patient admits to dietary noncompliance eating a lot of salty foods and being very sedentary. His wife helps him with his medications and admits to allowing him to eat what he wants. He is currently on Lasix 40 mg daily.  Creatinine 1.03.  The patient has been placed on Coreg 6.25 mg twice a day, continues on lisinopril 10 mg daily. Blood pressures not optimal in patient with significant systolic dysfunction. Weight loss has been minimal and only 3 pounds since admission but lower than previous office visit with Dr. [place] Lions at 285 pounds. . Uncertain of accuracy. of weights. He continues to have some abdominal distention but  diminished edema with clear lungs. Continue current medication regimen. Will need repeat echocardiogram in 3 months for reevaluation of LV function.  Low-sodium diet.  2. Hypertension: Currently controlled but not optimal with significant systolic dysfunction. Continue current medication regimen and may consider increasing lisinopril to 20 mg daily if blood pressure does not improve with ongoing diuresis. He remains on NTG paste. Will d/c this.  3. History of Afib: Remains in normal sinus rhythm on beta blocker only, amiodarone has been discontinued.  4. S/P Mitral Valve replacement: Per echocardiogram mitral valve bioprosthesis is present and functioning normally; estimated valve  area-1.83 cm2. the left atrium was moderately dilated.  5. Obesity: Pt apparently has lost 40 lbs on his own, but continues to have dietary noncompliance issues concerning salt.  Bettey Mare. Lyman Bishop NP [facility] 12/02/2012, 9:42 AM  Cardiology Attending Patient interviewed and examined. Discussed with Joni Reining, NP.  Above note annotated and modified based upon my findings.  BNP level returned previous baseline, which is only mildly elevated. Chest x-ray has cleared. Current medical therapy is effective and will be continued.  Systolic blood pressure of 130-150 will likely improve as medications for congestive heart failure are titrated upwards.  Dose of carvedilol and lisinopril increased. Aspirin and atorvastatin discontinued in the absence of known vascular disease. Aldactone added and potassium supplementation discontinued-electrolytes will require careful monitoring.  Alston Bing, MD 12/02/2012, 11:36 AM

## 2012-12-03 LAB — BASIC METABOLIC PANEL
BUN: 17 mg/dL (ref 6–23)
CO2: 36 mEq/L — ABNORMAL HIGH (ref 19–32)
Calcium: 9.6 mg/dL (ref 8.4–10.5)
Chloride: 97 mEq/L (ref 96–112)
Creatinine, Ser: 0.99 mg/dL (ref 0.50–1.35)
GFR calc Af Amer: >90 mL/min (ref >90)
GFR calc non Af Amer: 86 mL/min — ABNORMAL LOW (ref >90)
Glucose, Bld: 141 mg/dL — ABNORMAL HIGH (ref 70–99)
Potassium: 3.6 mEq/L (ref 3.5–5.1)
Sodium: 139 mEq/L (ref 135–145)

## 2012-12-03 LAB — GLUCOSE, CAPILLARY
Glucose-Capillary: 153 mg/dL — ABNORMAL HIGH (ref 70–99)
Glucose-Capillary: 153 mg/dL — ABNORMAL HIGH (ref 70–99)
Glucose-Capillary: 123 mg/dL — ABNORMAL HIGH (ref 70–99)
Glucose-Capillary: 140 mg/dL — ABNORMAL HIGH (ref 70–99)

## 2012-12-03 NOTE — Progress Notes (Signed)
Inpatient Diabetes Program Recommendations  AACE/ADA: New Consensus Statement on Inpatient Glycemic Control (2013)  Target Ranges:  Prepandial:   less than 140 mg/dL      Peak postprandial:   less than 180 mg/dL (1-2 hours)      Critically ill patients:  140 - 180 mg/dL   Results for ADIS, STURGILL (MRN [id number]) as of 12/03/2012 07:19  Ref. Range 12/02/2012 07:42 12/02/2012 11:46 12/02/2012 16:32 12/02/2012 21:41  Glucose-Capillary Latest Range: 70-99 mg/dL 409 (H) 811 (H) 914 (H) 235 (H)    Inpatient Diabetes Program Recommendations Correction (SSI): Please consider ordering Novolog bedtime correction.  Thanks, Orlando Penner, RN, MSN, CCRN Diabetes Coordinator Inpatient Diabetes Program [phone]

## 2012-12-03 NOTE — Plan of Care (Signed)
Problem: Phase III Progression Outcomes Goal: Activity at appropriate level-compared to baseline (UP IN CHAIR FOR HEMODIALYSIS)  Outcome: Completed/Met Date Met:  12/03/12 Patient ambulated in hallway without difficulty using the walker x's 1 assist.

## 2012-12-03 NOTE — Progress Notes (Signed)
SUBJECTIVE:Feeling and breathing better.Hoping to go home soon.  LABS: Basic Metabolic Panel:  Recent Labs  16/10/96 0506 12/03/12 0543  NA 139 139  K 3.8 3.6  CL 96 97  CO2 37* 36*  GLUCOSE 120* 141*  BUN 15 17  CREATININE 1.03 0.99  CALCIUM 9.5 9.6   Liver Function Tests:  Recent Labs  12/01/12 0451  AST 14  ALT 12  ALKPHOS 90  BILITOT 0.3  PROT 7.2  ALBUMIN 3.8   No results found for this basename: LIPASE, AMYLASE,  in the last 72 hours CBC:  Recent Labs  12/01/12 0451 12/02/12 0506  WBC 10.9* 5.7  HGB 12.9* 12.2*  HCT 42.4 39.7  MCV 86.9 86.7  PLT 209 194   Cardiac Enzymes:  Recent Labs  11/30/12 1505  TROPONINI <0.30   RADIOLOGY: Dg Chest 2 View  12/02/2012   *RADIOLOGY REPORT*  Clinical Data: CHF  CHEST - 2 VIEW  Comparison: 11/29/2012  Findings: The patient is status post median sternotomy CABG procedure.  There has been interval improvement in pulmonary edema pattern with improved aeration to both lower lobes.  There are small pleural effusions present.  No airspace consolidation.  IMPRESSION:  1.  Cardiac enlargement with improving interstitial edema. 2.  Pleural effusions.   Original Report Authenticated By: Signa Kell, M.D.   PHYSICAL EXAM BP 102/63  Pulse 56  Temp(Src) 98 F (36.7 C) (Oral)  Resp 20  Ht 5\' 9"  (1.753 m)  Wt 262 lb 5.6 oz (119 kg)  BMI 38.72 kg/m2  SpO2 98% Wt loss 6 lbs from highest recorded wt. General: Well developed, well nourished, in no acute distress Head: Eyes PERRLA, No xanthomas.   Normal cephalic and atramatic  Lungs: Clear bilaterally to auscultation and percussion. Heart: HRRR S1 S2, No MRG .  Pulses are 2+ & equal.            No carotid bruit. No JVD.  No abdominal bruits. No femoral bruits. Abdomen: Bowel sounds are positive, abdomen soft and non-tender without masses or                  Hernia's noted. Msk:  Back normal, normal gait. Normal strength and tone for age. Extremities: No clubbing,  cyanosis or edema.  DP +1 Neuro: Alert and oriented X 3. Psych:  Good affect, responds appropriately  TELEMETRY: Reviewed telemetry pt in: NSR I&O: -1.2 L over 24 hours  ASSESSMENT AND PLAN:  1. Systolic CHF: Appears compensated today without evidence of edema or wheezes. O2 is stable. He continues on lasix 40 mg daily with 6 lb wt loss since highest recorded wt. Continue coreg, lisinopril at current doses. Heart rate mildy bradycardic with increased dose of coreg, but he is tolerating this. BP is low normal for systolic dysfunction. Stable from CV standpoint to return home. Will need close follow up with Dr. [place] Lions.  2. Hypertension: Controlled and optimal for systolic dysfunction.   3. History of Atrial fibrillation: Remains in NSR. Sinus brady on current medications. Tolerating well.  4. S/P Mitral Valve replacement: Per echocardiogram mitral valve bioprosthesis is present and functioning normally; estimated valve area-1.83 cm2. the left atrium was moderately dilated.   Bettey Mare. Lyman Bishop NP [facility] 12/03/2012, 10:17 AM  Cardiology Attending Patient interviewed and examined. Discussed with Joni Reining, NP.  Above note annotated and modified based upon my findings.  Patient continues to diuresis and improved. Current medications appear appropriate. He does not require further hospitalization  from a cardiology standpoint.   Kaneohe Bing, MD 12/03/2012, 4:34 PM

## 2012-12-03 NOTE — Progress Notes (Signed)
[facility] PROGRESS NOTE  DYMOND SPREEN ZOX:[id number] DOB: 1950/09/20 DOA: 11/29/2012 PCP: Jeoffrey Massed, MD Cardiologist is Dr. Antoine Poche in [place]  Assessment/Plan: 1. Acute systolic heart failure. Continues to improve. New systolic dysfunction noted. Medications being adjusted by cardiology. Aspirin and atorvastatin discontinued in the absence of known vascular disease. Aldactone added and potassium supplementation discontinued-electrolytes will require careful monitoring--repeat basic metabolic panel prior to followup with cardiology as an outpatient. 2. Diabetic gastroparesis with nausea, vomiting. Acute component resolved status post IV Reglan and relative bowel rest. Advance diet and continue Reglan by mouth.  3. History of endocarditis, status post recent bioprosthetic mitral valve replacement. Per echocardiogram mitral valve bioprosthesis is present and functioning normally; estimated valve area-1.83 cm2. the left atrium was moderately dilated.  4. History of postoperative atrial fibrillation. He is in sinus rhythm. Amiodarone discontinued by cardiology. 5. Malignant hypertension. His accelerated blood pressure may have been secondary to his GI symptomatology or mild respiratory distress. Blood pressure now well-controlled. 6. Acute hypoxic respiratory failure. Resolved with treatment of acute issues. 7. Diabetes mellitus. Well controlled. Continue sliding scale NovoLog and restart glipizide upon discharge. Hemoglobin A1c 7.2. 8. Status post tick bite. Continue doxycycline empirically. No rash. 9. Depression, insomnia: Continue trazodone. Patient depressed. However there is no suicidal or homicidal ideation.   Anticipate discharge soon when cleared by cardiology  Will need repeat echocardiogram in 3 months for reevaluation of LV function.   Followup with cardiology in 3 weeks   Code Status: Full code DVT prophylaxis: Enoxaparin Family Communication: None  present Disposition Plan: In next 24 hours  Brendia Sacks, MD  [facility]  Pager [phone] If 7PM-7AM, please contact night-coverage at [email or web address], password [facility] 12/03/2012, 1:17 PM  LOS: 4 days   Clinical Summary: 62 year old man with a past history of diabetes on oral agents, hypertension, mitral valve replacement in March of this year at [facility], who was in his usual state of health until 6:30 PM last evening when he started having nausea, followed by multiple episodes of vomiting. He's had 8 episodes of vomiting without any blood. Denies any abdominal pain. Denies any fever or chills. Denies any dysuria. Denies any diarrhea. He, says he was diagnosed with diabetic gastroparesis and was prescribed Reglan. However, he hasn't filled his prescription yet. He was admitted to [facility] recently for similar issues. While he was being evaluated for his nausea and vomiting, he was found to be hypoxic. He was found to have lower extremity edema and on chest x-ray was found to have bilateral infiltrates, suggestive of pulmonary edema. Patient denies any shortness of breath or chest pain currently.  Consultants:  Cardiology  Procedures:  2-D echocardiogram: Left ventricular ejection fraction 35%. Severe hypokinesis and fair anterior, anteroseptal, apical myocardium. New since prior study  Antibiotics:  Doxycycline 5/26 >> 6/1  HPI/Subjective: Continues to improve. Breathing better. No complaints. Excellent urine output.  Objective: Filed Vitals:   12/03/12 0342 12/03/12 0500 12/03/12 0756 12/03/12 1100  BP: 146/70  102/63 132/66  Pulse: 72  56 80  Temp: 98 F (36.7 C)     TempSrc: Oral     Resp: 20     Height:      Weight: 119.3 kg (263 lb 0.1 oz) 119 kg (262 lb 5.6 oz)    SpO2: 98%       Intake/Output Summary (Last 24 hours) at 12/03/12 1317 Last data filed at 12/03/12 0757  Gross per 24 hour  Intake    580 ml  Output   2000 ml  Net  -1420 ml      Filed Weights   12/02/12 1743 12/03/12 0342 12/03/12 0500  Weight: 119.2 kg (262 lb 12.6 oz) 119.3 kg (263 lb 0.1 oz) 119 kg (262 lb 5.6 oz)    Exam:  General:  Appears calm and comfortable Cardiovascular: RRR, no m/r/g. 1+ bilateral LE edema. Respiratory: CTA bilaterally, no w/r/r. Normal respiratory effort. Psychiatric: grossly normal mood and affect, speech fluent and appropriate  Data Reviewed:  Capillary blood sugars stable. Basic metabolic panel without significant change. Creatinine preserved.   Scheduled Meds: . carvedilol  12.5 mg Oral BID WC  . doxycycline  100 mg Oral Q12H  . enoxaparin (LOVENOX) injection  40 mg Subcutaneous Q24H  . furosemide  40 mg Oral Daily  . insulin aspart  0-15 Units Subcutaneous TID WC  . lisinopril  40 mg Oral Daily  . metoCLOPramide  5 mg Oral TID AC  . pantoprazole  40 mg Oral BID AC  . sertraline  100 mg Oral Daily  . sodium chloride  3 mL Intravenous Q12H  . spironolactone  25 mg Oral Daily   Continuous Infusions:   Principal Problem:   Acute systolic CHF (congestive heart failure) Active Problems:   Depression   Morbid obesity   Diastolic CHF, acute   Diabetic gastroparesis   Nausea and vomiting   Tick bite   Malignant hypertension   Diabetes mellitus, type II   Hypertension   Endocarditis-resolved   Status post mitral valve replacement

## 2012-12-03 NOTE — Progress Notes (Signed)
Patient's B/P 102/62,heart rate 56, Dr Irene Limbo notified. Patient to receive Coreg 12.5 mg this am will ask if there are parameters. Will continue to monitor patient.

## 2012-12-04 ENCOUNTER — Encounter (HOSPITAL_COMMUNITY): Payer: BC Managed Care – PPO

## 2012-12-04 LAB — BASIC METABOLIC PANEL
BUN: 21 mg/dL (ref 6–23)
CO2: 37 mEq/L — ABNORMAL HIGH (ref 19–32)
Calcium: 9.8 mg/dL (ref 8.4–10.5)
Chloride: 97 mEq/L (ref 96–112)
Creatinine, Ser: 1.24 mg/dL (ref 0.50–1.35)
GFR calc Af Amer: 70 mL/min — ABNORMAL LOW (ref >90)
GFR calc non Af Amer: 61 mL/min — ABNORMAL LOW (ref >90)
Glucose, Bld: 177 mg/dL — ABNORMAL HIGH (ref 70–99)
Potassium: 4 mEq/L (ref 3.5–5.1)
Sodium: 141 mEq/L (ref 135–145)

## 2012-12-04 LAB — GLUCOSE, CAPILLARY
Glucose-Capillary: 157 mg/dL — ABNORMAL HIGH (ref 70–99)
Glucose-Capillary: 120 mg/dL — ABNORMAL HIGH (ref 70–99)

## 2012-12-04 MED ORDER — SPIRONOLACTONE 25 MG PO TABS: 25.0000 mg | ORAL_TABLET | Freq: Every day | ORAL | Status: DC

## 2012-12-04 MED ORDER — CARVEDILOL 12.5 MG PO TABS: 12.5000 mg | ORAL_TABLET | Freq: Two times a day (BID) | ORAL | Status: DC

## 2012-12-04 MED ORDER — TRAZODONE HCL 50 MG PO TABS: 50.0000 mg | ORAL_TABLET | Freq: Every day | ORAL | Status: DC

## 2012-12-04 MED ORDER — LISINOPRIL 40 MG PO TABS: 40.0000 mg | ORAL_TABLET | Freq: Every day | ORAL | Status: DC

## 2012-12-04 MED ORDER — FUROSEMIDE 40 MG PO TABS: 40.0000 mg | ORAL_TABLET | Freq: Every day | ORAL | Status: DC

## 2012-12-04 NOTE — Discharge Summary (Signed)
Physician Discharge Summary  Cody Hale YNW:[id number] DOB: 12-19-1950 DOA: 11/29/2012  PCP: Cody Massed, MD  Admit date: 11/29/2012 Discharge date: 12/04/2012  Recommendations for Outpatient Follow-up:  1. New diagnosis systolic heart failure--suggest BMP to reassess potassium and kidney function prior to cardiology outpatient visit in 3 weeks. 2. Continue treatment for diabetic gastroparesis   Follow-up Information   Follow up with Cody Rotunda, MD On 12/30/2012. (11:45 am)    Contact information:   [employer]       Follow up with Cody H, MD. Schedule an appointment as soon as possible for a visit in 2 weeks.   Contact information:   [address] [place] [address] [phone]      Discharge Diagnoses:  1. Acute systolic heart failure, new diagnosis 2. Diabetic gastroparesis with nausea, vomiting 3. Status post recent bypass and mitral valve replacement 4. History of postoperative atrial fibrillation  Discharge Condition: Improved Disposition: Home, home health RN for CHF  Diet recommendation: Heart healthy, diabetic diet  Filed Weights   12/03/12 0342 12/03/12 0500 12/04/12 0555  Weight: 119.3 kg (263 lb 0.1 oz) 119 kg (262 lb 5.6 oz) 119.75 kg (264 lb)    History of present illness:  62 year old man presented with nausea followed by multiple episodes of vomiting. Recently diagnosed with diabetic gastroparesis and prescribed Reglan which he had not yet started. Found to be hypoxic, have lower June edema chest x-ray had bilateral infiltrates suggestive of pulmonary edema.  Hospital Course:  [name] was admitted for further treatment of acute heart failure, initially presumed to be diastolic. 2-D echocardiogram revealed new systolic dysfunction. Patient was followed by cardiology and medications were adjusted. He  diuresed well and condition rapidly improved. He was cleared for discharge by cardiology and will followup with his primary  cardiologist as an outpatient in 3 weeks. Individual issues as below.  1. Acute systolic heart failure. Acute component resolved. New systolic dysfunction noted. Medications adjusted by cardiology. Aspirin and atorvastatin discontinued in the absence of known vascular disease per cardiology. Aldactone added and potassium supplementation discontinued-electrolytes will require careful monitoring--repeat basic metabolic panel prior to followup with cardiology as an outpatient. Cleared by cardiology for discharge. 2. Diabetic gastroparesis with nausea, vomiting. Acute component resolved status post IV Reglan and relative bowel rest. Advance diet and continue Reglan by mouth.  3. History of endocarditis, status post recent bioprosthetic mitral valve replacement. Per echocardiogram mitral valve bioprosthesis is present and functioning normally; estimated valve area-1.83 cm2. the left atrium was moderately dilated.  4. History of postoperative atrial fibrillation. He is in sinus rhythm. Amiodarone discontinued by cardiology. 5. Malignant hypertension. His accelerated blood pressure may have been secondary to his GI symptomatology or mild respiratory distress. Blood pressure now well-controlled. 6. Acute hypoxic respiratory failure. Resolved with treatment of acute issues. 7. Diabetes mellitus. Well controlled. Continue sliding scale NovoLog and restart glipizide upon discharge. Hemoglobin A1c 7.2. 8. Status post tick bite. No evidence of acute infection. Completed empiric doxycycline.  Discharge Instructions  Discharge Orders   Future Appointments Provider Department Dept Phone   12/07/2012 9:30 AM Ap-Cre[facility] Monitor 8 [facility] CARDIAC REHABILITATION [phone]   12/09/2012 9:30 AM Ap-Crehp Monitor 8 [place] CARDIAC REHABILITATION [phone]   12/09/2012 10:00 AM Cody Massed, MD [facility] PRIMARY CARE AT [facility] R[facility]GE [phone]   12/11/2012 9:30 AM Ap-Crehp Monitor 8 [place] CARDIAC REHABILITATION  [phone]   12/14/2012 9:30 AM Ap-Crehp Monitor 8 [place] CARDIAC REH[facility]ILITATION [phone]  12/16/2012 9:30 AM Ap-Crehp Monitor 8 [place] CARDIAC REHABILITATION [phone]   12/18/2012 9:30 AM Ap-Crehp Monitor 8 [place] CARDIAC REHABILITATION [phone]   12/21/2012 9:30 AM Ap-Crehp Monitor 8 [place] CARDIAC REHABILITATION [phone]   12/23/2012 9:30 AM Ap-Crehp Monitor 8 [place] CARDIAC REHABILITATION [phone]   12/25/2012 9:30 AM Ap-Crehp Monitor 8 [place] CARDIAC REHABILITATION [phone]   12/28/2012 9:30 AM Ap-Crehp Monitor 8 [place] CARDIAC REHABILITATION [phone]   12/30/2012 9:30 AM Ap-Crehp Monitor 8 [place] CARDIAC REHABILITATION [phone]   12/30/2012 11:45 AM Cody Rotunda, MD [place] [place] [phone]   01/01/2013 9:30 AM Ap-Crehp Monitor 8 [place] CARDIAC REHABILITATION [phone]   01/04/2013 9:30 AM Ap-Crehp Monitor 8 [place] CARDIAC REHABILITATION [phone]   01/06/2013 9:30 AM Ap-Crehp Monitor 8 [place] CARDIAC REHABILITATION [phone]   01/06/2013 10:00 AM Cody Budge, MD [place] [facility] [phone]   01/08/2013 9:30 AM Ap-Crehp Monitor 8 [place] CARDIAC REHABILITATION [phone]   01/11/2013 9:30 AM Ap-Crehp Monitor 8 [place] CARDIAC REHABILITATION [phone]   01/13/2013 9:30 AM Ap-Crehp Monitor 8 [place] CARDIAC REHABILITATION [phone]   01/15/2013 9:30 AM Ap-Crehp Monitor 8 [place] CARDIAC REHABILITATION [phone]   01/18/2013 9:30 AM Ap-Crehp Monitor 8 [place] CARDIAC REHABILITATION [phone]   01/20/2013 9:30 AM Ap-Crehp Monitor 8 [place] CARDIAC REHABILITATION [phone]   01/22/2013 9:30 AM Ap-Crehp Monitor 8 [place] CARDIAC REHABILITATION [phone]   01/25/2013 9:30 AM Ap-Crehp Monitor 8 [place] CARDIAC REHABILITATION [phone]   01/27/2013 9:30 AM Cody Rotunda, MD [place] [facility] [place] [phone]   01/27/2013 9:30 AM Ap-Crehp Monitor 8 [place] [facility] [phone]   01/29/2013 9:30 AM Ap-Crehp Monitor 8 [place] CARDIAC REHABILITATION [phone]   02/01/2013 9:30 AM Ap-Crehp Monitor 8 [place] CARDIAC REHABILITATION [phone]   02/03/2013 9:30 AM Ap-Crehp Monitor 8 [place] CARDIAC REHABILITATION [phone]   02/05/2013 9:30 AM Ap-Crehp Monitor 8 [place] CARDIAC REHABILITATION [phone]   02/08/2013 9:30 AM Ap-Crehp Monitor 8 [place] CARDIAC REHABILITATION [phone]   02/10/2013 9:30 AM Ap-Crehp Monitor 8 [place] CARDIAC REHABILITATION [phone]   02/12/2013 9:30 AM Ap-Crehp Monitor 8 [place] CARDIAC REHABILITATION [phone]   02/15/2013 9:30 AM Ap-Crehp Monitor 8 [place] CARDIAC REHABILITATION [phone]   02/17/2013 9:30 AM Ap-Crehp Monitor 8 [place] CARDIAC REHABILITATION [phone]   02/19/2013 9:30 AM Ap-Crehp Monitor 8 [place] CARDIAC REHABILITATION [phone]   02/22/2013 9:30 AM Ap-Crehp Monitor 8  CARDIAC REHABILITATION [phone]   Future Orders Complete By Expires     Diet - low sodium heart healthy  As directed     Diet Carb Modified  As directed     Discharge instructions  As directed     Comments:      Your medications have been adjusted by the cardiologist. Resume Reglan as previously prescribed. Call your physician or seek immediate medical attention for increasing shortness of breath, rapid weight gain or worsening of her condition. Weigh self daily. Strictly limit salt intake.    Increase activity slowly  As directed         Medication List    STOP taking these medications       amiodarone 200 MG tablet  Commonly known as:  PACERONE     aspirin EC 81 MG tablet     atorvastatin 20 MG tablet  Commonly known as:  LIPITOR      TAKE these medications       carvedilol 12.5 MG  tablet  Commonly known as:  COREG  Take 1 tablet (12.5 mg total) by mouth 2 (two) times daily with a meal.     FISH OIL PO  Take 1 capsule by mouth daily.      furosemide 40 MG tablet  Commonly known as:  LASIX  Take 1 tablet (40 mg total) by mouth daily.     glipiZIDE 10 MG tablet  Commonly known as:  GLUCOTROL  Take 10 mg by mouth 2 (two) times daily before a meal.     lisinopril 40 MG tablet  Commonly known as:  PRINIVIL,ZESTRIL  Take 1 tablet (40 mg total) by mouth daily.     multivitamin with minerals Tabs  Take 1 tablet by mouth daily.     nitroGLYCERIN 0.4 MG SL tablet  Commonly known as:  NITROSTAT  Place 1 tablet (0.4 mg total) under the tongue every 5 (five) minutes x 3 doses as needed for chest pain.     sertraline 100 MG tablet  Commonly known as:  ZOLOFT  Take 100 mg by mouth daily.     spironolactone 25 MG tablet  Commonly known as:  ALDACTONE  Take 1 tablet (25 mg total) by mouth daily.     traZODone 50 MG tablet  Commonly known as:  DESYREL  Take 1 tablet (50 mg total) by mouth at bedtime.       Allergies  Allergen Reactions  . Daptomycin Rash  . Tape Rash and Other (See Comments)    Adhesive Tape-Burn skin.    The results of significant diagnostics from this hospitalization (including imaging, microbiology, ancillary and laboratory) are listed below for reference.    Significant Diagnostic Studies: Dg Chest 2 View  12/02/2012   *RADIOLOGY REPORT*  Clinical Data: CHF  CHEST - 2 VIEW  Comparison: 11/29/2012  Findings: The patient is status post median sternotomy CABG procedure.  There has been interval improvement in pulmonary edema pattern with improved aeration to both lower lobes.  There are small pleural effusions present.  No airspace consolidation.  IMPRESSION:  1.  Cardiac enlargement with improving interstitial edema. 2.  Pleural effusions.   Original Report Authenticated By: Signa Kell, M.D.   Dg Chest Portable 1 View  11/30/2012   *RADIOLOGY REPORT*  Clinical Data: Cough  PORTABLE CHEST - 1 VIEW  Comparison: Fourth 32,014  Findings: Sternotomy wires overlie is mildly enlarged heart silhouette.   There is increased bibasilar air space disease suggesting pulmonary edema.  Upper lungs are clear.  No pneumothorax.  IMPRESSION: Bibasilar air space disease suggests pulmonary edema.   Original Report Authenticated By: Genevive Bi, M.D.   Labs: Basic Metabolic Panel:  Recent Labs Lab 11/29/12 2336 12/01/12 0451 12/02/12 0506 12/03/12 0543 12/04/12 0624  NA 137 142 139 139 141  K 4.4 3.6 3.8 3.6 4.0  CL 99 99 96 97 97  CO2 26 37* 37* 36* 37*  GLUCOSE 230* 126* 120* 141* 177*  BUN 14 13 15 17 21   CREATININE 0.94 0.96 1.03 0.99 1.24  CALCIUM 9.5 9.5 9.5 9.6 9.8   Liver Function Tests:  Recent Labs Lab 11/29/12 2336 12/01/12 0451  AST 15 14  ALT 14 12  ALKPHOS 105 90  BILITOT 0.3 0.3  PROT 7.7 7.2  ALBUMIN 3.9 3.8    Recent Labs Lab 11/29/12 2336  LIPASE 13   CBC:  Recent Labs Lab 11/29/12 2336 12/01/12 0451 12/02/12 0506  WBC 8.2 10.9* 5.7  NEUTROABS 7.6  --   --  HGB 13.3 12.9* 12.2*  HCT 42.8 42.4 39.7  MCV 85.4 86.9 86.7  PLT 179 209 194   Cardiac Enzymes:  Recent Labs Lab 11/29/12 2336 11/30/12 0327 11/30/12 0847 11/30/12 1505  TROPONINI <0.30 <0.30 <0.30 <0.30    Recent Labs  11/30/12 0038 12/01/12 0451 12/02/12 0506  PROBNP 1272.0* 1501.0* 512.9*   CBG:  Recent Labs Lab 12/03/12 0739 12/03/12 1151 12/03/12 1625 12/03/12 2134 12/04/12 0734  GLUCAP 153* 153* 123* 140* 157*    Principal Problem:   Acute systolic CHF (congestive heart failure) Active Problems:   Depression   Morbid obesity   Diastolic CHF, acute   Diabetic gastroparesis   Nausea and vomiting   Tick bite   Malignant hypertension   Diabetes mellitus, type II   Hypertension   Endocarditis-resolved   Status post mitral valve replacement   Time coordinating discharge:  35 minutes  Signed:  Brendia Sacks, MD [facility] 12/04/2012, 11:51 AM

## 2012-12-04 NOTE — Progress Notes (Signed)
UR chart review completed.  

## 2012-12-04 NOTE — Progress Notes (Signed)
Nutrition Education Note  RD consulted for nutrition education regarding new onset CHF.  RD provided "Low Sodium Nutrition Therapy" handout from the Academy of [employer] and Dietetics. Reviewed patient's dietary recall. Provided examples on ways to decrease sodium intake in diet. Discouraged intake of processed foods and use of salt shaker. Encouraged fresh fruits and vegetables as well as whole grain sources of carbohydrates to maximize fiber intake.   RD discussed why it is important for patient to adhere to diet recommendations, and emphasized the role of fluids, foods to avoid, and importance of weighing self daily. Teach back method used.  Expect good compliance.  Body mass index is 38.97 kg/(m^2). Pt meets criteria for Obesity Class II based on current BMI.  Current diet order is CHO Modified  , patient is consuming approximately 75% of meals at this time. Labs and medications reviewed. No further nutrition interventions warranted at this time. RD contact information provided. If additional nutrition issues arise, please re-consult RD.    Royann Shivers MS,RD,LDN,CSG Office: [phone] Pager: [phone]

## 2012-12-04 NOTE — Progress Notes (Signed)
[facility] PROGRESS NOTE  RAMIE ERMAN WUJ:[id number] DOB: 26-Oct-1950 DOA: 11/29/2012 PCP: Jeoffrey Massed, MD Cardiologist is Dr. Antoine Poche in [place]  Assessment/Plan: 1. Acute systolic heart failure. Acute component resolved. New systolic dysfunction noted. Medications adjusted by cardiology. Aspirin and atorvastatin discontinued in the absence of known vascular disease. Aldactone added and potassium supplementation discontinued-electrolytes will require careful monitoring--repeat basic metabolic panel prior to followup with cardiology as an outpatient. Cleared by cardiology for discharge. 2. Diabetic gastroparesis with nausea, vomiting. Acute component resolved status post IV Reglan and relative bowel rest. Advance diet and continue Reglan by mouth.  3. History of endocarditis, status post recent bioprosthetic mitral valve replacement. Per echocardiogram mitral valve bioprosthesis is present and functioning normally; estimated valve area-1.83 cm2. the left atrium was moderately dilated.  4. History of postoperative atrial fibrillation. He is in sinus rhythm. Amiodarone discontinued by cardiology. 5. Malignant hypertension. His accelerated blood pressure may have been secondary to his GI symptomatology or mild respiratory distress. Blood pressure now well-controlled. 6. Acute hypoxic respiratory failure. Resolved with treatment of acute issues. 7. Diabetes mellitus. Well controlled. Continue sliding scale NovoLog and restart glipizide upon discharge. Hemoglobin A1c 7.2. 8. Status post tick bite. Continue doxycycline empirically. No rash. 9. Depression, insomnia: Continue trazodone. Patient depressed. However there is no suicidal or homicidal ideation.   Home today.  Will need repeat echocardiogram in 3 months for reevaluation of LV function.   Followup with cardiology in 3 weeks  Code Status: Full code DVT prophylaxis: Enoxaparin Family Communication: Discussed with wife at  bedside. Disposition Plan: Home  Brendia Sacks, MD  [facility]  Pager [phone] If 7PM-7AM, please contact night-coverage at [email or web address], password [facility] 12/04/2012, 11:20 AM  LOS: 5 days   Clinical Summary: 62 year old man with a past history of diabetes on oral agents, hypertension, mitral valve replacement in March of this year at [facility], who was in his usual state of health until 6:30 PM last evening when he started having nausea, followed by multiple episodes of vomiting. He's had 8 episodes of vomiting without any blood. Denies any abdominal pain. Denies any fever or chills. Denies any dysuria. Denies any diarrhea. He, says he was diagnosed with diabetic gastroparesis and was prescribed Reglan. However, he hasn't filled his prescription yet. He was admitted to [facility] recently for similar issues. While he was being evaluated for his nausea and vomiting, he was found to be hypoxic. He was found to have lower extremity edema and on chest x-ray was found to have bilateral infiltrates, suggestive of pulmonary edema. Patient denies any shortness of breath or chest pain currently.  Consultants:  Cardiology  Procedures:  2-D echocardiogram: Left ventricular ejection fraction 35%. Severe hypokinesis and fair anterior, anteroseptal, apical myocardium. New since prior study  Antibiotics:  Doxycycline 5/26 >> 5/30  HPI/Subjective: Feels fine. No complaints. Ready to go home.  Objective: Filed Vitals:   12/03/12 1729 12/03/12 2135 12/04/12 0555 12/04/12 0737  BP: 116/64 112/54 130/94 101/44  Pulse: 64 53 76 62  Temp:  98.2 F (36.8 C) 97.7 F (36.5 C)   TempSrc:  Oral Oral   Resp:  20 20   Height:      Weight:   119.75 kg (264 lb)   SpO2:   98%     Intake/Output Summary (Last 24 hours) at 12/04/12 1120 Last data filed at 12/04/12 0659  Gross per 24 hour  Intake    240 ml  Output    800 ml  Net   -560 ml     Filed Weights   12/03/12 0342  12/03/12 0500 12/04/12 0555  Weight: 119.3 kg (263 lb 0.1 oz) 119 kg (262 lb 5.6 oz) 119.75 kg (264 lb)    Exam:  General: Appears calm and comfortable Cardiovascular: RRR, no m/r/g. 1+ bilateral LE edema. Respiratory: CTA bilaterally, no w/r/r. Normal respiratory effort. Psychiatric: grossly normal mood and affect, speech fluent and appropriate  Exam unchanged 5/30  Data Reviewed:  Capillary blood sugars stable. Basic metabolic panel unremarkable.   Scheduled Meds: . carvedilol  12.5 mg Oral BID WC  . doxycycline  100 mg Oral Q12H  . enoxaparin (LOVENOX) injection  40 mg Subcutaneous Q24H  . furosemide  40 mg Oral Daily  . insulin aspart  0-15 Units Subcutaneous TID WC  . lisinopril  40 mg Oral Daily  . metoCLOPramide  5 mg Oral TID AC  . pantoprazole  40 mg Oral BID AC  . sertraline  100 mg Oral Daily  . sodium chloride  3 mL Intravenous Q12H  . spironolactone  25 mg Oral Daily   Continuous Infusions:   Principal Problem:   Acute systolic CHF (congestive heart failure) Active Problems:   Depression   Morbid obesity   Diastolic CHF, acute   Diabetic gastroparesis   Nausea and vomiting   Tick bite   Malignant hypertension   Diabetes mellitus, type II   Hypertension   Endocarditis-resolved   Status post mitral valve replacement

## 2012-12-07 ENCOUNTER — Encounter (HOSPITAL_COMMUNITY): Payer: BC Managed Care – PPO

## 2012-12-09 ENCOUNTER — Ambulatory Visit (INDEPENDENT_AMBULATORY_CARE_PROVIDER_SITE_OTHER): Payer: BC Managed Care – PPO | Admitting: Family Medicine

## 2012-12-09 ENCOUNTER — Encounter: Payer: Self-pay | Admitting: Family Medicine

## 2012-12-09 ENCOUNTER — Encounter (HOSPITAL_COMMUNITY): Payer: BC Managed Care – PPO

## 2012-12-09 VITALS — BP 124/73 | HR 75 | Temp 98.2°F | Resp 16 | Wt 258.5 lb

## 2012-12-09 DIAGNOSIS — F329 Major depressive disorder, single episode, unspecified: Secondary | ICD-10-CM

## 2012-12-09 DIAGNOSIS — F331 Major depressive disorder, recurrent, moderate: Secondary | ICD-10-CM

## 2012-12-09 DIAGNOSIS — F32A Depression, unspecified: Secondary | ICD-10-CM

## 2012-12-09 DIAGNOSIS — F411 Generalized anxiety disorder: Secondary | ICD-10-CM

## 2012-12-09 MED ORDER — CITALOPRAM HYDROBROMIDE 20 MG PO TABS: 20.0000 mg | ORAL_TABLET | Freq: Every day | ORAL | Status: DC

## 2012-12-09 NOTE — Patient Instructions (Signed)
Take 1/2 of your zoloft 100mg  tab once a day for 5d, then stop this med. Start your citalopram 20mg  once daily as soon as possible.

## 2012-12-09 NOTE — Progress Notes (Signed)
OFFICE NOTE  12/16/2012  CC:  Chief Complaint  Patient presents with  . Follow-up    2-3wk [A&E Mood/ Irritability]     HPI: Patient is a 62 y.o. Caucasian male who is here for depression. Pt was also recently hospitalized for a flare of diabetic gastroparesis, was also given new dx of systolic heart failure during this hospitalization (11/29/12-12/04/12).  His amiodarone was d/c'd during hosp b/c he was in NSR in hosp and rate controlled well with beta blocker. Aspirin and atorvastatin also d/c'd since no vascular dz was found.  Pt relates a long hx (years) of recurrent depression--says he spends more time depressed than not.  Endorces irritability, frustration with his CV disease and the limitations is puts on him, esp dietary-wise. Appetite decreased, sleep poor, crying spells.  Nervous/worried all the time, poor concentration, low energy level.  No hx of panic attacks.  Reviewed general features of hypomanic/manic episodes and he has none of these.  Has been on sertraline 100mg  qd for 2 yrs and says it has not helped significantly.  No SI or HI.  Denies hx of any psych hospitalization.    Of note, he says his home glucoses (fasting) have averaged 110 for the last week since being d/c'd home from hospital.  Pertinent PMH:  Past Medical History  Diagnosis Date  . Diabetes mellitus, type II     Gastroparesis; GI care at [facility]  . Left leg cellulitis   . History of tobacco abuse   . Hypertension   . Degenerative joint disease   . Morbid obesity 06/27/2012  . 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 s/p zyvox rx.  (Cultures never positive); left bundle branch block; H/o SVT; 09/2012: bioprosthetic MVR at [facility]; a. Severe dental caries and cavities s/p multiple extractions.  . Depression   . Urinary tract infection 08/30/2012    Proteus mirabilis  . Chronic venous insufficiency   . Nephrolithiasis   . 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 Systolic heart failure  11/2012   Past surgical, social, and family history reviewed and no changes noted since last office visit.  MEDS:  Outpatient Prescriptions Prior to Visit  Medication Sig Dispense Refill  . carvedilol (COREG) 12.5 MG tablet Take 1 tablet (12.5 mg total) by mouth 2 (two) times daily with a meal.  60 tablet  0  . furosemide (LASIX) 40 MG tablet Take 1 tablet (40 mg total) by mouth daily.  30 tablet  0  . glipiZIDE (GLUCOTROL) 10 MG tablet Take 10 mg by mouth 2 (two) times daily before a meal.      . lisinopril (PRINIVIL,ZESTRIL) 40 MG tablet Take 1 tablet (40 mg total) by mouth daily.  30 tablet  0  . Multiple Vitamin (MULTIVITAMIN WITH MINERALS) TABS Take 1 tablet by mouth daily.      . nitroGLYCERIN (NITROSTAT) 0.4 MG SL tablet Place 1 tablet (0.4 mg total) under the tongue every 5 (five) minutes x 3 doses as needed for chest pain.  25 tablet  3  . Omega-3 Fatty Acids (FISH OIL PO) Take 1 capsule by mouth daily.      Marland Kitchen spironolactone (ALDACTONE) 25 MG tablet Take 1 tablet (25 mg total) by mouth daily.  30 tablet  0  . traZODone (DESYREL) 50 MG tablet Take 1 tablet (50 mg total) by mouth at bedtime.  30 tablet  0  . sertraline (ZOLOFT) 100 MG tablet Take 100 mg by mouth daily.  No facility-administered medications prior to visit.    PE: Blood pressure 124/73, pulse 75, temperature 98.2 F (36.8 C), temperature source Oral, resp. rate 16, weight 258 lb 8 oz (117.255 kg), SpO2 94.00%. Gen: Alert, well appearing.  Patient is oriented to person, place, time, and situation. AFFECT: a bit flat but makes good eye contact and displays lucid thought and speech. CV: RRR, no m/r/g.   LUNGS: CTA bilat, nonlabored resps, good aeration in all lung fields. EXT: no clubbing or cyanosis.  1+ bilat LE pitting edema. Neuro: CN 2-12 intact bilaterally, strength 5/5 in proximal and distal upper extremities and lower extremities bilaterally.  No tremor.  No ataxia.  Upper extremity and lower extremity DTRs  symmetric.  No pronator drift.   IMPRESSION AND PLAN:  Depression D/c zoloft. Start trial of citalopram.  Therapeutic expectations and side effect profile of medication discussed today.  Patient's questions answered.  Psychologist referral to [facility] ordered. Signs/symptoms to call or return for were reviewed and pt expressed understanding.    An After Visit Summary was printed and given to the patient.  FOLLOW UP: 1 mo

## 2012-12-11 ENCOUNTER — Encounter (HOSPITAL_COMMUNITY)
Admission: RE | Admit: 2012-12-11 | Discharge: 2012-12-11 | Disposition: A | Discharge status: Home / Self Care | Payer: BC Managed Care – PPO | Source: Ambulatory Visit | Attending: Cardiology | Admitting: Cardiology

## 2012-12-11 DIAGNOSIS — I330 Acute and subacute infective endocarditis: Secondary | ICD-10-CM | POA: Insufficient documentation

## 2012-12-11 DIAGNOSIS — Z5189 Encounter for other specified aftercare: Secondary | ICD-10-CM | POA: Insufficient documentation

## 2012-12-11 DIAGNOSIS — I059 Rheumatic mitral valve disease, unspecified: Secondary | ICD-10-CM | POA: Insufficient documentation

## 2012-12-11 DIAGNOSIS — I447 Left bundle-branch block, unspecified: Secondary | ICD-10-CM | POA: Insufficient documentation

## 2012-12-14 ENCOUNTER — Encounter (HOSPITAL_COMMUNITY)
Admission: RE | Admit: 2012-12-14 | Discharge: 2012-12-14 | Disposition: A | Discharge status: Home / Self Care | Payer: BC Managed Care – PPO | Source: Ambulatory Visit | Attending: Cardiology | Admitting: Cardiology

## 2012-12-16 ENCOUNTER — Encounter (HOSPITAL_COMMUNITY)
Admission: RE | Admit: 2012-12-16 | Discharge: 2012-12-16 | Disposition: A | Discharge status: Home / Self Care | Payer: BC Managed Care – PPO | Source: Ambulatory Visit | Attending: Cardiology | Admitting: Cardiology

## 2012-12-16 ENCOUNTER — Encounter: Payer: Self-pay | Admitting: Family Medicine

## 2012-12-16 NOTE — Assessment & Plan Note (Signed)
D/c zoloft. Start trial of citalopram.  Therapeutic expectations and side effect profile of medication discussed today.  Patient's questions answered.  Psychologist referral to [facility] ordered. Signs/symptoms to call or return for were reviewed and pt expressed understanding.

## 2012-12-17 DIAGNOSIS — K3184 Gastroparesis: Secondary | ICD-10-CM

## 2012-12-17 DIAGNOSIS — E1149 Type 2 diabetes mellitus with other diabetic neurological complication: Secondary | ICD-10-CM

## 2012-12-17 DIAGNOSIS — I5021 Acute systolic (congestive) heart failure: Secondary | ICD-10-CM

## 2012-12-17 DIAGNOSIS — I509 Heart failure, unspecified: Secondary | ICD-10-CM

## 2012-12-18 ENCOUNTER — Encounter (HOSPITAL_COMMUNITY)
Admission: RE | Admit: 2012-12-18 | Discharge: 2012-12-18 | Disposition: A | Discharge status: Home / Self Care | Payer: BC Managed Care – PPO | Source: Ambulatory Visit | Attending: Cardiology | Admitting: Cardiology

## 2012-12-21 ENCOUNTER — Encounter (HOSPITAL_COMMUNITY)
Admission: RE | Admit: 2012-12-21 | Discharge: 2012-12-21 | Disposition: A | Discharge status: Home / Self Care | Payer: BC Managed Care – PPO | Source: Ambulatory Visit | Attending: Cardiology | Admitting: Cardiology

## 2012-12-23 ENCOUNTER — Encounter (HOSPITAL_COMMUNITY): Payer: BC Managed Care – PPO

## 2012-12-24 ENCOUNTER — Emergency Department (HOSPITAL_COMMUNITY): Payer: BC Managed Care – PPO

## 2012-12-24 ENCOUNTER — Emergency Department (HOSPITAL_COMMUNITY)
Admission: EM | Admit: 2012-12-24 | Discharge: 2012-12-26 | Disposition: A | Discharge status: Other Acute Inpatient Hospital | Payer: BC Managed Care – PPO | Attending: Emergency Medicine | Admitting: Emergency Medicine

## 2012-12-24 DIAGNOSIS — Z79899 Other long term (current) drug therapy: Secondary | ICD-10-CM | POA: Insufficient documentation

## 2012-12-24 DIAGNOSIS — Z872 Personal history of diseases of the skin and subcutaneous tissue: Secondary | ICD-10-CM | POA: Insufficient documentation

## 2012-12-24 DIAGNOSIS — K3184 Gastroparesis: Secondary | ICD-10-CM | POA: Insufficient documentation

## 2012-12-24 DIAGNOSIS — F3289 Other specified depressive episodes: Secondary | ICD-10-CM | POA: Insufficient documentation

## 2012-12-24 DIAGNOSIS — F32A Depression, unspecified: Secondary | ICD-10-CM

## 2012-12-24 DIAGNOSIS — R45851 Suicidal ideations: Secondary | ICD-10-CM | POA: Insufficient documentation

## 2012-12-24 DIAGNOSIS — Z87891 Personal history of nicotine dependence: Secondary | ICD-10-CM | POA: Insufficient documentation

## 2012-12-24 DIAGNOSIS — Z8679 Personal history of other diseases of the circulatory system: Secondary | ICD-10-CM | POA: Insufficient documentation

## 2012-12-24 DIAGNOSIS — R5381 Other malaise: Secondary | ICD-10-CM | POA: Insufficient documentation

## 2012-12-24 DIAGNOSIS — R630 Anorexia: Secondary | ICD-10-CM | POA: Insufficient documentation

## 2012-12-24 DIAGNOSIS — Z87442 Personal history of urinary calculi: Secondary | ICD-10-CM | POA: Insufficient documentation

## 2012-12-24 DIAGNOSIS — K029 Dental caries, unspecified: Secondary | ICD-10-CM | POA: Insufficient documentation

## 2012-12-24 DIAGNOSIS — I5020 Unspecified systolic (congestive) heart failure: Secondary | ICD-10-CM | POA: Insufficient documentation

## 2012-12-24 DIAGNOSIS — I10 Essential (primary) hypertension: Secondary | ICD-10-CM | POA: Insufficient documentation

## 2012-12-24 DIAGNOSIS — F411 Generalized anxiety disorder: Secondary | ICD-10-CM | POA: Insufficient documentation

## 2012-12-24 DIAGNOSIS — Z87440 Personal history of urinary (tract) infections: Secondary | ICD-10-CM | POA: Insufficient documentation

## 2012-12-24 DIAGNOSIS — R450 Nervousness: Secondary | ICD-10-CM | POA: Insufficient documentation

## 2012-12-24 DIAGNOSIS — E1149 Type 2 diabetes mellitus with other diabetic neurological complication: Secondary | ICD-10-CM | POA: Insufficient documentation

## 2012-12-24 DIAGNOSIS — F329 Major depressive disorder, single episode, unspecified: Secondary | ICD-10-CM | POA: Insufficient documentation

## 2012-12-24 LAB — CBC WITH DIFFERENTIAL/PLATELET
Basophils Absolute: 0 10*3/uL (ref 0.0–0.1)
Basophils Relative: 1 % (ref 0–1)
Eosinophils Absolute: 0.2 10*3/uL (ref 0.0–0.7)
Eosinophils Relative: 5 % (ref 0–5)
HCT: 38.9 % — ABNORMAL LOW (ref 39.0–52.0)
Hemoglobin: 12.1 g/dL — ABNORMAL LOW (ref 13.0–17.0)
Lymphocytes Relative: 23 % (ref 12–46)
Lymphs Abs: 1 10*3/uL (ref 0.7–4.0)
MCH: 26.1 pg (ref 26.0–34.0)
MCHC: 31.1 g/dL (ref 30.0–36.0)
MCV: 84 fL (ref 78.0–100.0)
Monocytes Absolute: 0.5 10*3/uL (ref 0.1–1.0)
Monocytes Relative: 11 % (ref 3–12)
Neutro Abs: 2.5 10*3/uL (ref 1.7–7.7)
Neutrophils Relative %: 60 % (ref 43–77)
Platelets: 115 10*3/uL — ABNORMAL LOW (ref 150–400)
RBC: 4.63 MIL/uL (ref 4.22–5.81)
RDW: 16.3 % — ABNORMAL HIGH (ref 11.5–15.5)
WBC: 4.2 10*3/uL (ref 4.0–10.5)

## 2012-12-24 LAB — URINALYSIS, ROUTINE W REFLEX MICROSCOPIC
Bilirubin Urine: NEGATIVE
Glucose, UA: NEGATIVE mg/dL
Hgb urine dipstick: NEGATIVE
Ketones, ur: NEGATIVE mg/dL
Leukocytes, UA: NEGATIVE
Nitrite: NEGATIVE
Protein, ur: NEGATIVE mg/dL
Specific Gravity, Urine: 1.02 (ref 1.005–1.030)
Urobilinogen, UA: 0.2 mg/dL (ref 0.0–1.0)
pH: 5.5 (ref 5.0–8.0)

## 2012-12-24 LAB — RAPID URINE DRUG SCREEN, HOSP PERFORMED
Amphetamines: NOT DETECTED
Barbiturates: NOT DETECTED
Benzodiazepines: NOT DETECTED
Cocaine: NOT DETECTED
Opiates: NOT DETECTED
Tetrahydrocannabinol: NOT DETECTED

## 2012-12-24 LAB — COMPREHENSIVE METABOLIC PANEL
ALT: 13 U/L (ref 0–53)
AST: 16 U/L (ref 0–37)
Albumin: 3.8 g/dL (ref 3.5–5.2)
Alkaline Phosphatase: 75 U/L (ref 39–117)
BUN: 17 mg/dL (ref 6–23)
CO2: 32 mEq/L (ref 19–32)
Calcium: 9.5 mg/dL (ref 8.4–10.5)
Chloride: 100 mEq/L (ref 96–112)
Creatinine, Ser: 1.17 mg/dL (ref 0.50–1.35)
GFR calc Af Amer: 75 mL/min — ABNORMAL LOW (ref >90)
GFR calc non Af Amer: 65 mL/min — ABNORMAL LOW (ref >90)
Glucose, Bld: 106 mg/dL — ABNORMAL HIGH (ref 70–99)
Potassium: 4 mEq/L (ref 3.5–5.1)
Sodium: 140 mEq/L (ref 135–145)
Total Bilirubin: 0.3 mg/dL (ref 0.3–1.2)
Total Protein: 7.1 g/dL (ref 6.0–8.3)

## 2012-12-24 LAB — GLUCOSE, CAPILLARY: Glucose-Capillary: 125 mg/dL — ABNORMAL HIGH (ref 70–99)

## 2012-12-24 LAB — PRO B NATRIURETIC PEPTIDE: Pro B Natriuretic peptide (BNP): 878.5 pg/mL — ABNORMAL HIGH (ref 0–125)

## 2012-12-24 LAB — TROPONIN I: Troponin I: <0.3 ng/mL (ref <0.30)

## 2012-12-24 MED ORDER — CARVEDILOL 12.5 MG PO TABS
12.5000 mg | ORAL_TABLET | Freq: Two times a day (BID) | ORAL | Status: DC
  Administered 2012-12-24 – 2012-12-26 (×3): 12.5 mg via ORAL
  Filled 2012-12-24 (×11): qty 1

## 2012-12-24 MED ORDER — TRAZODONE HCL 50 MG PO TABS
ORAL_TABLET | ORAL | Status: AC
  Filled 2012-12-24: qty 2

## 2012-12-24 MED ORDER — ADULT MULTIVITAMIN W/MINERALS CH
1.0000 | ORAL_TABLET | Freq: Every day | ORAL | Status: DC
  Administered 2012-12-24 – 2012-12-26 (×3): 1 via ORAL
  Filled 2012-12-24 (×3): qty 1

## 2012-12-24 MED ORDER — TRAZODONE HCL 50 MG PO TABS
50.0000 mg | ORAL_TABLET | Freq: Every day | ORAL | Status: DC
  Filled 2012-12-24 (×2): qty 1

## 2012-12-24 MED ORDER — ARIPIPRAZOLE 2 MG PO TABS
2.0000 mg | ORAL_TABLET | Freq: Every day | ORAL | Status: DC
  Administered 2012-12-25 – 2012-12-26 (×2): 2 mg via ORAL
  Filled 2012-12-24 (×5): qty 1

## 2012-12-24 MED ORDER — NITROGLYCERIN 0.4 MG SL SUBL: 0.4000 mg | SUBLINGUAL_TABLET | SUBLINGUAL | Status: DC | PRN

## 2012-12-24 MED ORDER — FUROSEMIDE 40 MG PO TABS
40.0000 mg | ORAL_TABLET | Freq: Every day | ORAL | Status: DC
  Administered 2012-12-24 – 2012-12-26 (×3): 40 mg via ORAL
  Filled 2012-12-24 (×3): qty 1

## 2012-12-24 MED ORDER — SPIRONOLACTONE 25 MG PO TABS
25.0000 mg | ORAL_TABLET | Freq: Every day | ORAL | Status: DC
  Administered 2012-12-24 – 2012-12-26 (×3): 25 mg via ORAL
  Filled 2012-12-24 (×6): qty 1

## 2012-12-24 MED ORDER — INSULIN ASPART 100 UNIT/ML ~~LOC~~ SOLN: 0.0000 [IU] | Freq: Three times a day (TID) | SUBCUTANEOUS | Status: DC

## 2012-12-24 MED ORDER — VENLAFAXINE HCL ER 75 MG PO CP24
75.0000 mg | ORAL_CAPSULE | Freq: Every day | ORAL | Status: DC
  Administered 2012-12-25 – 2012-12-26 (×2): 75 mg via ORAL
  Filled 2012-12-24 (×4): qty 1

## 2012-12-24 MED ORDER — TRAZODONE HCL 50 MG PO TABS
75.0000 mg | ORAL_TABLET | Freq: Every day | ORAL | Status: DC
  Administered 2012-12-25 (×2): 75 mg via ORAL
  Filled 2012-12-24 (×4): qty 1.5

## 2012-12-24 MED ORDER — GLIPIZIDE 10 MG PO TABS
10.0000 mg | ORAL_TABLET | Freq: Two times a day (BID) | ORAL | Status: DC
  Administered 2012-12-24 – 2012-12-25 (×2): 10 mg via ORAL
  Filled 2012-12-24 (×11): qty 1

## 2012-12-24 MED ORDER — ONDANSETRON HCL 4 MG PO TABS: 4.0000 mg | ORAL_TABLET | Freq: Three times a day (TID) | ORAL | Status: DC | PRN

## 2012-12-24 MED ORDER — LISINOPRIL 10 MG PO TABS
40.0000 mg | ORAL_TABLET | Freq: Every day | ORAL | Status: DC
  Administered 2012-12-24 – 2012-12-26 (×3): 40 mg via ORAL
  Filled 2012-12-24 (×3): qty 4

## 2012-12-24 MED ORDER — ARIPIPRAZOLE 2 MG PO TABS
ORAL_TABLET | ORAL | Status: AC
  Filled 2012-12-24: qty 1

## 2012-12-24 MED ORDER — CITALOPRAM HYDROBROMIDE 20 MG PO TABS
20.0000 mg | ORAL_TABLET | Freq: Every day | ORAL | Status: DC
  Administered 2012-12-24: 20 mg via ORAL
  Filled 2012-12-24 (×3): qty 1
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
## 2012-12-24 NOTE — ED Notes (Signed)
Pt states he feels as if he is a burden to his wife and states "I feel like things would be better for everyone if I wasn't here anymore". Pt reports SI but denies any attempts. Pt denies a specific plan but states "I've thought about different ways I'd do it but never decide on which one". Pt states these thoughts began in march after his heart sx and have progressively worsened since.

## 2012-12-24 NOTE — Progress Notes (Signed)
Called [facility] [phone], spoke with Victorino Dike about referral  She requested notes, labs, ekg, and chest x-ray.  Information faxed to [phone].  She will call with disposition. Discussed with pt's nurse Joselyn Glassman and Dr Manus Gunning.
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
## 2012-12-25 ENCOUNTER — Encounter (HOSPITAL_COMMUNITY): Payer: BC Managed Care – PPO

## 2012-12-25 LAB — GLUCOSE, CAPILLARY
Glucose-Capillary: 72 mg/dL (ref 70–99)
Glucose-Capillary: 33 mg/dL — CL (ref 70–99)
Glucose-Capillary: 76 mg/dL (ref 70–99)
Glucose-Capillary: 146 mg/dL — ABNORMAL HIGH (ref 70–99)
Glucose-Capillary: 95 mg/dL (ref 70–99)
Glucose-Capillary: 92 mg/dL (ref 70–99)

## 2012-12-25 NOTE — ED Notes (Signed)
Family updated with facility acceptance information.

## 2012-12-25 NOTE — ED Notes (Signed)
Pt talking with daughter on  Phone.

## 2012-12-25 NOTE — ED Notes (Addendum)
Per [facility], blood glucose must remain stable x24 hours before the patient can be accepted, range of 70-300, Diane with ACT team aware.

## 2012-12-25 NOTE — [facility] Counselor (Signed)
Griffin Dakin, RN ACT at [facility], submitted Pt for admission to [facility]. Brook McNichol, [facility] reviewed clinical information and said that Pt's CBG need to be 70-350 for 24 hours per policy before Pt can be considered for admission to [facility]. Pt will be considered for admission when this criteria is met.  Harlin Rain Patsy Baltimore, LPC, [facility] Assessment Counselor

## 2012-12-25 NOTE — ED Notes (Signed)
Patient made a personal phone call - no change noted in behavior afterwards.

## 2012-12-25 NOTE — ED Notes (Signed)
Pt alert and oriented at baseline, pt states his blood sugar does feel slightly low and this happens to him occasionally.  Orange juice with sugar provided, as well as meal tray to pt.  Blood sugar rechecked.

## 2012-12-25 NOTE — ED Notes (Signed)
Resting quietly, no complaints offered.  

## 2012-12-25 NOTE — ED Notes (Signed)
Blood sugar was 146

## 2012-12-25 NOTE — ED Notes (Signed)
Awaiting effexor from pharmacy. Pt watching tv. Nad.
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
## 2012-12-26 ENCOUNTER — Encounter (HOSPITAL_COMMUNITY): Payer: Self-pay

## 2012-12-26 ENCOUNTER — Inpatient Hospital Stay (HOSPITAL_COMMUNITY)
Admission: AD | Admit: 2012-12-26 | Discharge: 2013-01-05 | DRG: 430 | Disposition: A | Discharge status: Home / Self Care | Payer: BC Managed Care – PPO | Source: Intra-hospital | Attending: Psychiatry | Admitting: Psychiatry

## 2012-12-26 DIAGNOSIS — I340 Nonrheumatic mitral (valve) insufficiency: Secondary | ICD-10-CM

## 2012-12-26 DIAGNOSIS — E119 Type 2 diabetes mellitus without complications: Secondary | ICD-10-CM

## 2012-12-26 DIAGNOSIS — R45851 Suicidal ideations: Secondary | ICD-10-CM

## 2012-12-26 DIAGNOSIS — I10 Essential (primary) hypertension: Secondary | ICD-10-CM | POA: Diagnosis present

## 2012-12-26 DIAGNOSIS — F331 Major depressive disorder, recurrent, moderate: Secondary | ICD-10-CM | POA: Diagnosis present

## 2012-12-26 DIAGNOSIS — I5021 Acute systolic (congestive) heart failure: Secondary | ICD-10-CM

## 2012-12-26 DIAGNOSIS — F332 Major depressive disorder, recurrent severe without psychotic features: Principal | ICD-10-CM | POA: Diagnosis present

## 2012-12-26 DIAGNOSIS — F32A Depression, unspecified: Secondary | ICD-10-CM

## 2012-12-26 DIAGNOSIS — Z79899 Other long term (current) drug therapy: Secondary | ICD-10-CM

## 2012-12-26 DIAGNOSIS — I38 Endocarditis, valve unspecified: Secondary | ICD-10-CM

## 2012-12-26 DIAGNOSIS — X838XXA Intentional self-harm by other specified means, initial encounter: Secondary | ICD-10-CM

## 2012-12-26 DIAGNOSIS — L03119 Cellulitis of unspecified part of limb: Secondary | ICD-10-CM

## 2012-12-26 DIAGNOSIS — Z954 Presence of other heart-valve replacement: Secondary | ICD-10-CM

## 2012-12-26 DIAGNOSIS — I5020 Unspecified systolic (congestive) heart failure: Secondary | ICD-10-CM | POA: Diagnosis present

## 2012-12-26 DIAGNOSIS — G4733 Obstructive sleep apnea (adult) (pediatric): Secondary | ICD-10-CM

## 2012-12-26 DIAGNOSIS — F329 Major depressive disorder, single episode, unspecified: Secondary | ICD-10-CM

## 2012-12-26 DIAGNOSIS — I5031 Acute diastolic (congestive) heart failure: Secondary | ICD-10-CM

## 2012-12-26 DIAGNOSIS — E1143 Type 2 diabetes mellitus with diabetic autonomic (poly)neuropathy: Secondary | ICD-10-CM

## 2012-12-26 DIAGNOSIS — Z952 Presence of prosthetic heart valve: Secondary | ICD-10-CM

## 2012-12-26 LAB — GLUCOSE, CAPILLARY
Glucose-Capillary: 111 mg/dL — ABNORMAL HIGH (ref 70–99)
Glucose-Capillary: 109 mg/dL — ABNORMAL HIGH (ref 70–99)
Glucose-Capillary: 138 mg/dL — ABNORMAL HIGH (ref 70–99)

## 2012-12-26 MED ORDER — GLIPIZIDE 10 MG PO TABS
10.0000 mg | ORAL_TABLET | Freq: Two times a day (BID) | ORAL | Status: DC
  Administered 2012-12-27 – 2013-01-05 (×19): 10 mg via ORAL
  Filled 2012-12-26 (×8): qty 1
  Filled 2012-12-26: qty 2
  Filled 2012-12-26 (×13): qty 1

## 2012-12-26 MED ORDER — ALUM & MAG HYDROXIDE-SIMETH 200-200-20 MG/5ML PO SUSP: 30.0000 mL | ORAL | Status: DC | PRN

## 2012-12-26 MED ORDER — SPIRONOLACTONE 25 MG PO TABS
25.0000 mg | ORAL_TABLET | Freq: Every day | ORAL | Status: DC
  Administered 2012-12-27 – 2013-01-04 (×8): 25 mg via ORAL
  Filled 2012-12-26 (×11): qty 1

## 2012-12-26 MED ORDER — MAGNESIUM HYDROXIDE 400 MG/5ML PO SUSP
30.0000 mL | Freq: Every day | ORAL | Status: DC | PRN
  Administered 2012-12-27: 30 mL via ORAL

## 2012-12-26 MED ORDER — ADULT MULTIVITAMIN W/MINERALS CH
1.0000 | ORAL_TABLET | Freq: Every day | ORAL | Status: DC
  Administered 2012-12-27 – 2013-01-05 (×9): 1 via ORAL
  Filled 2012-12-26 (×11): qty 1

## 2012-12-26 MED ORDER — TRAZODONE HCL 50 MG PO TABS
50.0000 mg | ORAL_TABLET | Freq: Every day | ORAL | Status: DC
  Administered 2012-12-26 – 2012-12-27 (×2): 50 mg via ORAL
  Filled 2012-12-26 (×5): qty 1

## 2012-12-26 MED ORDER — CARVEDILOL 12.5 MG PO TABS
12.5000 mg | ORAL_TABLET | Freq: Two times a day (BID) | ORAL | Status: DC
  Administered 2012-12-27 – 2013-01-04 (×15): 12.5 mg via ORAL
  Filled 2012-12-26 (×21): qty 1

## 2012-12-26 MED ORDER — CITALOPRAM HYDROBROMIDE 20 MG PO TABS
20.0000 mg | ORAL_TABLET | Freq: Every day | ORAL | Status: DC
  Administered 2012-12-27 – 2012-12-29 (×3): 20 mg via ORAL
  Filled 2012-12-26 (×5): qty 1

## 2012-12-26 MED ORDER — OMEGA-3-ACID ETHYL ESTERS 1 G PO CAPS
1.0000 g | ORAL_CAPSULE | Freq: Every day | ORAL | Status: DC
  Administered 2012-12-27 – 2013-01-05 (×9): 1 g via ORAL
  Filled 2012-12-26 (×11): qty 1

## 2012-12-26 MED ORDER — LISINOPRIL 40 MG PO TABS
40.0000 mg | ORAL_TABLET | Freq: Every day | ORAL | Status: DC
  Administered 2012-12-27 – 2013-01-04 (×8): 40 mg via ORAL
  Filled 2012-12-26 (×11): qty 1

## 2012-12-26 MED ORDER — FUROSEMIDE 40 MG PO TABS
40.0000 mg | ORAL_TABLET | Freq: Every day | ORAL | Status: DC
  Administered 2012-12-27 – 2013-01-04 (×8): 40 mg via ORAL
  Filled 2012-12-26 (×11): qty 1

## 2012-12-26 MED ORDER — NITROGLYCERIN 0.4 MG SL SUBL: 0.4000 mg | SUBLINGUAL_TABLET | SUBLINGUAL | Status: DC | PRN

## 2012-12-26 MED ORDER — ACETAMINOPHEN 325 MG PO TABS
650.0000 mg | ORAL_TABLET | Freq: Four times a day (QID) | ORAL | Status: DC | PRN
  Administered 2012-12-31 – 2013-01-01 (×2): 650 mg via ORAL

## 2012-12-26 NOTE — ED Notes (Signed)
Report given to Boyd Kerbs, Charity fundraiser at [employer]

## 2012-12-26 NOTE — BH Assessment (Signed)
[facility] Assessment Progress Note      Completed inpatient pre-certification with [facility]. Please UR sheet. Authorization # [id number], 3 days, by Vonzell Schlatter. IVC paperwork complete. Faxed information to [facility]. [facility] is ready to transport patient.    Shon Baton, MSW, LCSW, LCASA, CSW-G

## 2012-12-26 NOTE — BH Assessment (Signed)
[facility] Assessment Progress Note  Pt reviewed with Dahlia Byes, NP, who agrees to accept pt to [facility] to the service of Leata Mouse, MD, Rm [phone].  At 13:12 I called Shon Baton, Assessment Counselor to notify her.  Doylene Canning, MA Assessment Counselor 12/26/2012 @ 13:14

## 2012-12-26 NOTE — Tx Team (Signed)
Initial Interdisciplinary Treatment Plan  PATIENT STRENGTHS: (choose at least two) General fund of knowledge Special hobby/interest Supportive family/friends  PATIENT STRESSORS: Health problems Marital or family conflict   PROBLEM LIST: Problem List/Patient Goals Date to be addressed Date deferred Reason deferred Estimated date of resolution  depression                                                       DISCHARGE CRITERIA:  Improved stabilization in mood, thinking, and/or behavior Medical problems require only outpatient monitoring Motivation to continue treatment in a less acute level of care Verbal commitment to aftercare and medication compliance  PRELIMINARY DISCHARGE PLAN: Return to previous living arrangement  PATIENT/FAMIILY INVOLVEMENT: This treatment plan has been presented to and reviewed with the patient, Daine Floras, and/or family member,.  The patient and family have been given the opportunity to ask questions and make suggestions.  Andrena Mews 12/26/2012, 6:34 PM
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
## 2012-12-26 NOTE — ED Notes (Signed)
Pt given breakfast tray, sitter remains at bedside,  

## 2012-12-26 NOTE — ED Notes (Signed)
Pt ambulatory to restroom, tolerated well, sitter remains at bedside,  

## 2012-12-26 NOTE — ED Notes (Addendum)
Pt's sister at bedside, pt and sister updated on plan of care, pt states that information may be given to his sister and daughter, no information may be given to his wife. Sister contact information is Rosie Golson [phone] cell, Zaveon Gillen [phone]

## 2012-12-26 NOTE — ED Notes (Signed)
Pt's wife on phone, pt refusing to speak to her at present time, advised to tell wife that he was asleep. Pt wife told that he was asleep per his request.

## 2012-12-27 DIAGNOSIS — R45851 Suicidal ideations: Secondary | ICD-10-CM

## 2012-12-27 DIAGNOSIS — F331 Major depressive disorder, recurrent, moderate: Secondary | ICD-10-CM | POA: Diagnosis present

## 2012-12-27 DIAGNOSIS — F339 Major depressive disorder, recurrent, unspecified: Secondary | ICD-10-CM

## 2012-12-27 LAB — GLUCOSE, CAPILLARY
Glucose-Capillary: 114 mg/dL — ABNORMAL HIGH (ref 70–99)
Glucose-Capillary: 115 mg/dL — ABNORMAL HIGH (ref 70–99)
Glucose-Capillary: 77 mg/dL (ref 70–99)
Glucose-Capillary: 83 mg/dL (ref 70–99)
Glucose-Capillary: 103 mg/dL — ABNORMAL HIGH (ref 70–99)

## 2012-12-27 MED ORDER — INSULIN ASPART 100 UNIT/ML ~~LOC~~ SOLN
3.0000 [IU] | Freq: Three times a day (TID) | SUBCUTANEOUS | Status: DC
  Administered 2013-01-01 – 2013-01-04 (×5): 3 [IU] via SUBCUTANEOUS

## 2012-12-27 MED ORDER — INSULIN ASPART 100 UNIT/ML ~~LOC~~ SOLN
0.0000 [IU] | Freq: Three times a day (TID) | SUBCUTANEOUS | Status: DC
  Administered 2013-01-02: 3 [IU] via SUBCUTANEOUS
  Administered 2013-01-02: 1 [IU] via SUBCUTANEOUS
  Administered 2013-01-04: 2 [IU] via SUBCUTANEOUS
  Administered 2013-01-04: 1 [IU] via SUBCUTANEOUS

## 2012-12-27 NOTE — Progress Notes (Signed)
Psychoeducational Group Note  Date:  12/27/2012 Time:  1015  Group Topic/Focus:  Making Healthy Choices:   The focus of this group is to help patients identify negative/unhealthy choices they were using prior to admission and identify positive/healthier coping strategies to replace them upon discharge.  Participation Level:  Active  Participation Quality:  Appropriate  Affect:  Appropriate  Cognitive:  Oriented  Insight:  Engaged and Improving  Engagement in Group:  Engaged  Additional Comments:    Dollie Bressi A 12/27/2012 

## 2012-12-27 NOTE — [facility] Group Notes (Signed)
[facility] LCSW Group Therapy  12/27/2012   3:00 PM   Type of Therapy:  Group Therapy  Participation Level:  Active  Participation Quality:  Appropriate and Attentive  Affect:  Appropriate, Flat and Depressed  Cognitive:  Alert and Appropriate  Insight:  Developing/Improving and Engaged  Engagement in Therapy:  Developing/Improving and Engaged  Modes of Intervention:  Clarification, Confrontation, Discussion, Education, Exploration, Limit-setting, Orientation, Problem-solving, Rapport Building, Dance movement psychotherapist, Socialization and Support  Summary of Progress/Problems: The main focus of today's process group was to identify the patient's current support system and decide on other supports that can be put in place.  An emphasis was placed on using counselor, doctor, therapy groups, 12-step groups, and problem-specific support groups to expand supports, as well as doing something different than has been done before. Pt shared that his daughter, sister and brother are his biggest supporters.  Pt states that he feels they ban together and figure out how to best help him out.  Pt actively participated and was engaged in group discussion.     Reyes Ivan, LCSWA 12/27/2012 1:54 PM

## 2012-12-27 NOTE — Progress Notes (Signed)
Patient ID: Cody Hale, male   DOB: 26-Sep-1950, 62 y.o.   MRN: [id number] D)  Has been pleasant, cooperative, and interacting well with staff and peers.  His meds that he had been on hadn't been reordered, NP on call was made aware and meds were ordered. Attended group this evening, seemed attentive and engaged.  Requested trazadone for sleep, denies pain or SOB. A)  Will continue to monitor for safety, continue POC, support R)  Appreciative, receptive, safety maintained.

## 2012-12-27 NOTE — Progress Notes (Signed)
D) Pt has attended the groups and interacts with his peers. Sits in the dayroom and will listen and watch TV. Will contribute in groups and smile occasionally. Rates his depression at a 5 and his hopelessness at an 6. Denies SI and HI. Denies pain. A) given support and reassurance along with praise. Encouraged to attend all the groups. R) Denies SI and HI.
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
## 2012-12-27 NOTE — [facility] Suicide Risk Assessment (Signed)
Suicide Risk Assessment  Admission Assessment     Nursing information obtained from:  Patient Demographic factors:  Male;Caucasian Current Mental Status:  Self-harm thoughts Loss Factors:  Decline in physical health Historical Factors:  NA Risk Reduction Factors:  Sense of responsibility to family;Living with another person, especially a relative;Positive social support  CLINICAL FACTORS:   Severe Anxiety and/or Agitation Depression:   Aggression Anhedonia Hopelessness Impulsivity Insomnia Recent sense of peace/wellbeing Severe Medical Diagnoses and Treatments/Surgeries  COGNITIVE FEATURES THAT CONTRIBUTE TO RISK:  Closed-mindedness Loss of executive function Polarized thinking    SUICIDE RISK:   Moderate:  Frequent suicidal ideation with limited intensity, and duration, some specificity in terms of plans, no associated intent, good self-control, limited dysphoria/symptomatology, some risk factors present, and identifiable protective factors, including available and accessible social support.  PLAN OF CARE: Admitted emergently and involuntarily from [facility] to [facility] for depression and suicidal ideation with plan of drowning. He will be receiving crisis management and in patient program for safety.    I certify that inpatient services furnished can reasonably be expected to improve the patient's condition.  Avinash Maltos,JANARDHAHA R. 12/27/2012, 2:49 PM
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
## 2012-12-28 ENCOUNTER — Encounter (HOSPITAL_COMMUNITY): Payer: BC Managed Care – PPO

## 2012-12-28 DIAGNOSIS — F332 Major depressive disorder, recurrent severe without psychotic features: Principal | ICD-10-CM

## 2012-12-28 LAB — GLUCOSE, CAPILLARY
Glucose-Capillary: 97 mg/dL (ref 70–99)
Glucose-Capillary: 111 mg/dL — ABNORMAL HIGH (ref 70–99)
Glucose-Capillary: 120 mg/dL — ABNORMAL HIGH (ref 70–99)
Glucose-Capillary: 93 mg/dL (ref 70–99)

## 2012-12-28 MED ORDER — GUAIFENESIN ER 600 MG PO TB12
600.0000 mg | ORAL_TABLET | Freq: Two times a day (BID) | ORAL | Status: DC
  Administered 2012-12-28 – 2013-01-05 (×15): 600 mg via ORAL
  Filled 2012-12-28 (×19): qty 1

## 2012-12-28 MED ORDER — TRAZODONE HCL 100 MG PO TABS
100.0000 mg | ORAL_TABLET | Freq: Every day | ORAL | Status: DC
  Administered 2012-12-28: 100 mg via ORAL
  Filled 2012-12-28 (×4): qty 1

## 2012-12-28 MED ORDER — HYDROXYZINE HCL 25 MG PO TABS
25.0000 mg | ORAL_TABLET | Freq: Four times a day (QID) | ORAL | Status: DC | PRN
  Administered 2012-12-28 – 2012-12-30 (×4): 25 mg via ORAL

## 2012-12-28 NOTE — Progress Notes (Signed)
Adult Psychoeducational Group Note  Date:  12/27/2012 Time:  20;00  Group Topic/Focus:  Wrap-Up Group:   The focus of this group is to help patients review their daily goal of treatment and discuss progress on daily workbooks.  Participation Level:  Active  Participation Quality:  Appropriate  Affect:  Appropriate  Cognitive:  Appropriate  Insight: Appropriate  Engagement in Group:  Engaged  Modes of Intervention:  Discussion and Education  Additional Comments:  patient stated overall he had a good day but enjoy the outside time.  Casilda Carls 12/28/2012, 3:51 AM

## 2012-12-28 NOTE — Progress Notes (Signed)
Patient ID: Cody Hale, male   DOB: 1950-12-02, 62 y.o.   MRN: [id number] D)  Has been out and about on the hall, interacting appropriately with staff and peers, polite, pleasant.  Affect is a little brighter, good eye contact, asking appropriate questions, attending and participating in groups.  Denies thoughts of self harm, no c/o pain. A)  Will continue to monitor for safety, continue POC, support R)  Safety maintained

## 2012-12-28 NOTE — Progress Notes (Signed)
Recreation Therapy Notes  Date: 06.23.2014        Time: 3:00pm Location: 500 [address] Dayroom      Group Topic/Focus: Self Expression  Participation Level: Active  Participation Quality: Appropriate  Affect: Euthymic  Cognitive: Appropriate   Additional Comments: Activity: Colors within Me; Explanation: Patients were asked to assign a color to a specific emotion. Using the colors they identified there were asked to color a blank face to represent the emotions most present in them. Classical music was played to enhance therapeutic environment.  Patient arrived to group session at approximately 3:10pm. Patient listened to activity instructions and left group session. Patient returned at approximately 3:20pm, but did not participate in group activity. Patient actively participated in group activity.   Marykay Lex Bradee Common, LRT/CTRS  Jearl Klinefelter 12/28/2012 4:26 PM

## 2012-12-28 NOTE — [facility] Group Notes (Signed)
[facility] LCSW Aftercare Discharge Planning Group Note   12/28/2012 12:32 PM  Participation Quality:  Approprite  Mood/Affect:  Appropriate and Depressed  Depression Rating:  8  Anxiety Rating:  8  Thoughts of Suicide:  No  Will you contract for safety?   NA  Current AVH:  No  Plan for Discharge/Comments:  Patient reports increased depression, anger, and SI.  He advised of medical problems being a significant stressor.  Patient has home and transportation.  He will need assistance with follow up and states it will be okay to schedule follow up in [place].  Transportation Means:   Patient has transportation.   Supports:  Patient has a good support system.   Kathrine Rieves, Joesph July

## 2012-12-28 NOTE — Progress Notes (Signed)
Grief and Loss Group   Group members processed their feelings and experiences with significant losses in their lives.   Pt shared about the death of his 62 year old son to a brain tumor. He talked about his sadness and pain surrounding this event even though it was many years ago. Pt also talked about drawing strength from the happy memories of his son and finding support in his relationships with his family.  Sherol Dade  Counselor Intern  Haroldine Laws

## 2012-12-28 NOTE — [facility] Group Notes (Signed)
[facility] LCSW Group Therapy          Overcoming Obstacles       1:15 -2:30        12/28/2012   1:12 PM     Type of Therapy:  Group Therapy  Participation Level:  Appropriate  Participation Quality:  Appropriate  Affect:  Appropriate  Cognitive:  Attentive Appropriate  Insight:  Engaged  Engagement in Therapy:  Engaged  Modes of Intervention:  Discussion Exploration Problem-Solving Supportive  Summary of Progress/Problems:  Patient shared the obstacle he needs to overcome is his wife of 33 years.  He advised he has not informed wife of being in the hospital. Patient was cautioned about making any serious decision about his marriage while in the hospital.  Wynn Banker 12/28/2012    1:12 PM

## 2012-12-28 NOTE — Progress Notes (Signed)
D:  Per pt self inventory pt reports sleeping fair, appetite poor, energy level low, ability to pay attention good, rates depression at a 4 out of 10 and hopelessness at a 5 out of 10.  Denies SI/HI/aVH at this time states that he had SI on and off last night but hasn't had any thoughts today.     A:  Emotional support provided, Encouraged pt to continue with treatment plan and attend all group activities, q15 min checks maintained for safety.  R:  Pt is bright, pleasant and is participating in group activities.

## 2012-12-28 NOTE — Progress Notes (Signed)
Adult Psychoeducational Group Note  Date:  12/28/2012 Time:  3:36 PM  Group Topic/Focus:  Self Care:   The focus of this group is to help patients understand the importance of self-care in order to improve or restore emotional, physical, spiritual, interpersonal, and financial health.  Participation Level:  Active  Participation Quality:  Appropriate, Attentive and Sharing  Affect:  Appropriate  Cognitive:  Appropriate  Insight: Appropriate and Good  Engagement in Group:  Developing/Improving and Engaged  Modes of Intervention:  Discussion, Education, Socialization and Support  Additional Comments: Cody Hale attended and shared during group. Patient define self-care in her own terms. Patient was asked to complete the self care assessment in workbook for daily theme, patient completed assessment and rated the areas of care of physical, psychological, emotional, spiritual, relationship care and was asked to give weaknesses and strengths in the areas. Patient also expressed ways to improve care and asked to set a goal for the areas of weaknesses.   Cody Hale 12/28/2012, 3:36 PM

## 2012-12-28 NOTE — Progress Notes (Signed)
[facility] MD Progress Note  12/28/2012 3:28 PM Cody Hale  MRN:  [id number] Subjective:  Cody Hale states today "I'm really feeling low." He rates his depression at eight and feeling anxious at nine stating "I'm feeling like a nervous cat." Reports thoughts of suicide that come and go. Patient nervous about his wife calling him at [facility] as he just left her a message to indicate his whereabouts. He feels his main stressors are relationship with wife and medical problems. Patient feels that going to marriage counseling would be beneficial as "I am so angry inside. I'm tired of snapping at her." Patient requesting medicine to help with his anxiety. Also reports sleeping better when he was taking a higher dose of Trazodone.  Objective: Patient appears very nervous and is observed to be mildly tremulous.  Diagnosis:   Axis I: Major Depression, Recurrent severe Axis II: Deferred Axis III:  Past Medical History  Diagnosis Date  . Diabetes mellitus, type II     Gastroparesis; GI care at [facility]  . Left leg cellulitis   . History of tobacco abuse   . Hypertension   . Degenerative joint disease   . Morbid obesity 06/27/2012  . 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 s/p zyvox rx.  (Cultures never positive); left bundle branch block; H/o SVT; 09/2012: bioprosthetic MVR at [facility]; a. Severe dental caries and cavities s/p multiple extractions.  . Depression   . Urinary tract infection 08/30/2012    Proteus mirabilis  . Chronic venous insufficiency   . Nephrolithiasis   . 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 Systolic heart failure 11/2012   Axis IV: other psychosocial or environmental problems and Chronic health issues Axis V: 41-50 serious symptoms  ADL's:  Intact  Sleep: Poor  Appetite:  Poor  Suicidal Ideation:  Plan:  [place] as he is unable to swim Intent:  Present Means:  Safety maintained in the hospital Homicidal Ideation:  Denies AEB (as evidenced  by):  Psychiatric Specialty Exam: Review of Systems  Constitutional: Negative.   HENT: Negative.   Eyes: Negative.   Respiratory: Negative.   Cardiovascular: Negative.   Gastrointestinal: Negative.   Genitourinary: Negative.   Musculoskeletal: Negative.   Skin: Negative.   Neurological: Negative.   Endo/Heme/Allergies: Negative.   Psychiatric/Behavioral: Positive for depression and suicidal ideas. Negative for hallucinations, memory loss and substance abuse. The patient is nervous/anxious and has insomnia.     Blood pressure 104/61, pulse 65, temperature 97.7 F (36.5 C), temperature source Oral, resp. rate 18, height 5\' 9"  (1.753 m), weight 116.574 kg (257 lb).Body mass index is 37.93 kg/(m^2).  General Appearance: Casual and Fairly Groomed  Patent attorney::  Good  Speech:  Clear and Coherent  Volume:  Normal  Mood:  Anxious, Dysphoric and Worthless  Affect:  Flat and Sad  Thought Process:  Goal Directed and Intact  Orientation:  Full (Time, Place, and Person)  Thought Content:  WDL  Suicidal Thoughts:  Yes.  with intent/plan  Homicidal Thoughts:  No  Memory:  Immediate;   Good Recent;   Fair Remote;   Fair  Judgement:  Impaired  Insight:  Present  Psychomotor Activity:  Normal  Concentration:  Fair  Recall:  Fair  Akathisia:  No  Handed:  Right  AIMS (if indicated):     Assets:  Communication Skills Desire for Improvement Housing Intimacy Resilience Social Support  Sleep:  Number of Hours: 4.25   Current Medications: Current Facility-Administered Medications  Medication Dose Route Frequency Provider Last Rate Last Dose  . acetaminophen (TYLENOL) tablet 650 mg  650 mg Oral Q6H PRN Earney Navy, NP      . alum & mag hydroxide-simeth (MAALOX/MYLANTA) 200-200-20 MG/5ML suspension 30 mL  30 mL Oral Q4H PRN Earney Navy, NP      . carvedilol (COREG) tablet 12.5 mg  12.5 mg Oral BID WC Verne Spurr, PA-C   12.5 mg at 12/28/12 0803  . citalopram (CELEXA)  tablet 20 mg  20 mg Oral Daily Verne Spurr, PA-C   20 mg at 12/28/12 1610  . furosemide (LASIX) tablet 40 mg  40 mg Oral Daily Verne Spurr, PA-C   40 mg at 12/28/12 0803  . glipiZIDE (GLUCOTROL) tablet 10 mg  10 mg Oral BID AC Verne Spurr, PA-C   10 mg at 12/28/12 0654  . hydrOXYzine (ATARAX/VISTARIL) tablet 25 mg  25 mg Oral Q6H PRN Fransisca Kaufmann, NP      . insulin aspart (novoLOG) injection 0-9 Units  0-9 Units Subcutaneous TID WC Sanjuana Kava, NP      . insulin aspart (novoLOG) injection 3 Units  3 Units Subcutaneous TID WC Sanjuana Kava, NP      . lisinopril (PRINIVIL,ZESTRIL) tablet 40 mg  40 mg Oral Daily Verne Spurr, PA-C   40 mg at 12/28/12 0803  . magnesium hydroxide (MILK OF MAGNESIA) suspension 30 mL  30 mL Oral Daily PRN Earney Navy, NP   30 mL at 12/27/12 2153  . multivitamin with minerals tablet 1 tablet  1 tablet Oral Daily Verne Spurr, PA-C   1 tablet at 12/28/12 0803  . nitroGLYCERIN (NITROSTAT) SL tablet 0.4 mg  0.4 mg Sublingual Q5 min PRN Verne Spurr, PA-C      . omega-3 acid ethyl esters (LOVAZA) capsule 1 g  1 g Oral Daily Verne Spurr, PA-C   1 g at 12/28/12 9604  . spironolactone (ALDACTONE) tablet 25 mg  25 mg Oral Daily Verne Spurr, PA-C   25 mg at 12/28/12 0803  . traZODone (DESYREL) tablet 100 mg  100 mg Oral QHS Fransisca Kaufmann, NP        Lab Results:  Results for orders placed during the hospital encounter of 12/26/12 (from the past 48 hour(s))  GLUCOSE, CAPILLARY     Status: Abnormal   Collection Time    12/26/12  8:53 PM      Result Value Range   Glucose-Capillary 138 (*) 70 - 99 mg/dL   Comment 1 Notify RN    GLUCOSE, CAPILLARY     Status: Abnormal   Collection Time    12/27/12  6:21 AM      Result Value Range   Glucose-Capillary 114 (*) 70 - 99 mg/dL  GLUCOSE, CAPILLARY     Status: Abnormal   Collection Time    12/27/12  8:21 AM      Result Value Range   Glucose-Capillary 115 (*) 70 - 99 mg/dL   Comment 1 Documented in Chart      Comment 2 Notify RN    GLUCOSE, CAPILLARY     Status: None   Collection Time    12/27/12 11:48 AM      Result Value Range   Glucose-Capillary 77  70 - 99 mg/dL   Comment 1 Notify RN    GLUCOSE, CAPILLARY     Status: None   Collection Time    12/27/12  5:07 PM      Result Value  Range   Glucose-Capillary 83  70 - 99 mg/dL  GLUCOSE, CAPILLARY     Status: Abnormal   Collection Time    12/27/12  8:29 PM      Result Value Range   Glucose-Capillary 103 (*) 70 - 99 mg/dL   Comment 1 Notify RN     Comment 2 Documented in Chart    GLUCOSE, CAPILLARY     Status: None   Collection Time    12/28/12  5:58 AM      Result Value Range   Glucose-Capillary 97  70 - 99 mg/dL   Comment 1 Notify RN     Comment 2 Documented in Chart    GLUCOSE, CAPILLARY     Status: Abnormal   Collection Time    12/28/12 11:53 AM      Result Value Range   Glucose-Capillary 111 (*) 70 - 99 mg/dL    Physical Findings: AIMS: Facial and Oral Movements Muscles of Facial Expression: None, normal Lips and Perioral Area: None, normal Jaw: None, normal Tongue: None, normal,Extremity Movements Upper (arms, wrists, hands, fingers): None, normal Lower (legs, knees, ankles, toes): None, normal, Trunk Movements Neck, shoulders, hips: None, normal, Overall Severity Severity of abnormal movements (highest score from questions above): None, normal Incapacitation due to abnormal movements: None, normal Patient's awareness of abnormal movements (rate only patient's report): No Awareness, Dental Status Current problems with teeth and/or dentures?: No Does patient usually wear dentures?: No  CIWA:    COWS:     Treatment Plan Summary: Daily contact with patient to assess and evaluate symptoms and progress in treatment Medication management  Plan: Continue crisis management and stabilization.  Medication management: Reviewed medications with patient who stated no untoward effects. Initiated Vistaril 25 mg every six hours  as needed for symptoms of anxiety. Increase Trazodone to 100 mg hs to help improve the quality of patient's sleep.  Encouraged patient to attend groups and participate in group counseling sessions and activities.  Discharge plan in progress.  Address health issues: Vitals reviewed and stable.  Continue current treatment plan.   Medical Decision Making Problem Points:  Established problem, stable/improving (1) and Review of psycho-social stressors (1) Data Points:  Review of medication regiment & side effects (2)  I certify that inpatient services furnished can reasonably be expected to improve the patient's condition.   Ruven Corradi NP-C 12/28/2012, 3:28 PM

## 2012-12-29 LAB — GLUCOSE, CAPILLARY
Glucose-Capillary: 84 mg/dL (ref 70–99)
Glucose-Capillary: 84 mg/dL (ref 70–99)
Glucose-Capillary: 87 mg/dL (ref 70–99)
Glucose-Capillary: 128 mg/dL — ABNORMAL HIGH (ref 70–99)

## 2012-12-29 MED ORDER — CITALOPRAM HYDROBROMIDE 20 MG PO TABS
30.0000 mg | ORAL_TABLET | Freq: Every day | ORAL | Status: DC
  Administered 2012-12-30: 30 mg via ORAL
  Filled 2012-12-29 (×2): qty 1

## 2012-12-29 MED ORDER — TRAZODONE HCL 100 MG PO TABS
100.0000 mg | ORAL_TABLET | Freq: Every evening | ORAL | Status: DC | PRN
  Administered 2012-12-29: 100 mg via ORAL
  Filled 2012-12-29 (×4): qty 1

## 2012-12-29 NOTE — Progress Notes (Signed)
D: Patient in the hallway on first approach.  Patient states he had an up and down day.  Patient states his day did not start off too good. Patient states after he went outside today his sinuses started acting up.  Patient states he is learning to open up more.  Patient states he is having SI on and off.  Patient verbally contracts for safety. A: Staff to monitor Q 15 mins for safety.  Encouragement and support offered.  Scheduled medications administered per orders.  Vistaril administered prn for anxiety R: Patient remains safe on the unit.  Patient attended group tonight.  Patient cooperative and taking administered medications.  Patient visible on the unit and interacting with peers.

## 2012-12-29 NOTE — Progress Notes (Signed)
Adult Psychoeducational Group Note  Date:  12/29/2012 Time:  11:42 AM  Group Topic/Focus:  Recovery Goals:   The focus of this group is to identify appropriate goals for recovery and establish a plan to achieve them.  Participation Level:  Active  Participation Quality:  Appropriate, Attentive and Sharing  Affect:  Appropriate  Cognitive:  Appropriate  Insight: Appropriate  Engagement in Group:  Engaged  Modes of Intervention:  Discussion  Additional Comments:  Pt was appropriate and sharing while attending group. Pt shared that he plans to change the people that are around him. He stated that negative people make him depressed so hanging with positive people is his goal.   Sharyn Lull 12/29/2012, 11:42 AM

## 2012-12-29 NOTE — [facility] Group Notes (Signed)
[facility] LCSW Aftercare Discharge Planning Group Note   12/29/2012 11:45 AM  Participation Quality:  Approprite  Mood/Affect:  Appropriate and Depressed  Depression Rating: 1  Anxiety Rating: 1  Thoughts of Suicide:  No  Will you contract for safety?   NA  Current AVH:  No  Plan for Discharge/Comments:  Patient reports feeling so,so today.  He advised of making contact with his wife but not planning to returning to the home.    Transportation Means:   Patient has transportation.   Supports:  Patient has a good support system.   Collin Hendley, Joesph July
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
## 2012-12-29 NOTE — Progress Notes (Signed)
D:  Patient's self inventory sheet, patient has fair sleep, improving appetite, low energy level, good attention span.  Rated depression and hopelessness #2.  Denied withdrawals.  Denied SI.  Has experienced headache, blurred vision in past 24 hours.  Pain goal #6 today, worst pain #3.   A:  Medications administered per MD orders.  Emotional support and encouragement given patient. R:  Denied SI and HI.  Denied A/V hallucinations.  Denied pain.  Will continue to monitor patient every 15 minute checks for safety.  Safety maintained.

## 2012-12-29 NOTE — Progress Notes (Signed)
Recreation Therapy Notes  Date: 06.24.2014 Time: 2:45pm Location: 500 Hall Dayroom     Group Topic/Focus: Musician (AAA/T)  Participation Level: Active  Participation Quality: Appropriate  Affect: Euthymic  Cognitive: Appropriate  Additional Comments: 06.24.2014 Session = AAA Session; Dog Team = [facility] and handler  Patient with peers educated on search and rescue. Patient pet and visited with [place]. Patient interacted appropriately with peers, LRT and dog team.    Jearl Klinefelter, LRT/CTRS  Jearl Klinefelter 12/29/2012 4:46 PM

## 2012-12-29 NOTE — Progress Notes (Signed)
Adult Psychoeducational Group Note  Date:  12/29/2012 Time:  11:27 PM  Group Topic/Focus:  Wrap-Up Group:   The focus of this group is to help patients review their daily goal of treatment and discuss progress on daily workbooks.  Participation Level:  Minimal  Participation Quality:  Appropriate  Affect:  Appropriate  Cognitive:  Lacking  Insight: None  Engagement in Group:  Engaged  Modes of Intervention:  Discussion and Education  Additional Comments:  Pt was present in group but didn't participate in group  Gwenevere Ghazi Patience 12/29/2012, 11:27 PM

## 2012-12-29 NOTE — Progress Notes (Signed)
Patient ID: Cody Hale, male   DOB: 1951/01/29, 62 y.o.   MRN: [id number] [facility] MD Progress Note  12/29/2012 3:44 PM Cody Hale  MRN:  [id number] Subjective:   [name] rates his anxiety at nine and his depression at eight. Patient reports calling his wife and found out "She doesn't want me back. I will have to go somewhere else. I feel like all is uncertain right now. I have passive SI." The patient reports that his sister will let him stay with her for a while until he can secure his own place. [name] states "My wife has made me feel so small over the years. She makes me feel worthless. I feel like I may be better off without her in my life." He reports having suicidal thought after speaking with wife but contracts for his safety in the hospital. Patient is actively attending groups and interacting with his peers.  Diagnosis:   Axis I: Major Depression, Recurrent severe Axis II: Deferred Axis III:  Past Medical History  Diagnosis Date  . Diabetes mellitus, type II     Gastroparesis; GI care at [facility]  . Left leg cellulitis   . History of tobacco abuse   . Hypertension   . Degenerative joint disease   . Morbid obesity 06/27/2012  . 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 s/p zyvox rx.  (Cultures never positive); left bundle branch block; H/o SVT; 09/2012: bioprosthetic MVR at [facility]; a. Severe dental caries and cavities s/p multiple extractions.  . Depression   . Urinary tract infection 08/30/2012    Proteus mirabilis  . Chronic venous insufficiency   . Nephrolithiasis   . 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 Systolic heart failure 11/2012   Axis IV: other psychosocial or environmental problems and Chronic health issues Axis V: 41-50 serious symptoms  ADL's:  Intact  Sleep: Fair  Appetite:  Poor  Suicidal Ideation:  Plan:  [place] he [place] to swim Intent:  Present Means:  Safety maintained in the hospital Homicidal Ideation:  Denies AEB  (as evidenced by):  Psychiatric Specialty Exam: Review of Systems  Constitutional: Negative.   HENT: Negative.   Eyes: Negative.   Respiratory: Negative.   Cardiovascular: Negative.   Gastrointestinal: Negative.   Genitourinary: Negative.   Musculoskeletal: Negative.   Skin: Negative.   Neurological: Negative.   Endo/Heme/Allergies: Negative.   Psychiatric/Behavioral: Positive for depression and suicidal ideas. Negative for hallucinations, memory loss and substance abuse. The patient is nervous/anxious and has insomnia.     Blood pressure 128/71, pulse 75, temperature 98.5 F (36.9 C), temperature source Oral, resp. rate 20, height 5\' 9"  (1.753 m), weight 116.574 kg (257 lb).Body mass index is 37.93 kg/(m^2).  General Appearance: Casual and Fairly Groomed  Patent attorney::  Good  Speech:  Clear and Coherent  Volume:  Normal  Mood:  Anxious, Dysphoric and Worthless  Affect:  Flat and Sad  Thought Process:  Goal Directed and Intact  Orientation:  Full (Time, Place, and Person)  Thought Content:  WDL  Suicidal Thoughts:  Yes.  with intent/plan  Homicidal Thoughts:  No  Memory:  Immediate;   Good Recent;   Fair Remote;   Fair  Judgement:  Impaired  Insight:  Present  Psychomotor Activity:  Normal  Concentration:  Fair  Recall:  Fair  Akathisia:  No  Handed:  Right  AIMS (if indicated):     Assets:  Communication Skills Desire for Improvement Housing  Intimacy Resilience Social Support  Sleep:  Number of Hours: 4.75   Current Medications: Current Facility-Administered Medications  Medication Dose Route Frequency Provider Last Rate Last Dose  . acetaminophen (TYLENOL) tablet 650 mg  650 mg Oral Q6H PRN Earney Navy, NP      . alum & mag hydroxide-simeth (MAALOX/MYLANTA) 200-200-20 MG/5ML suspension 30 mL  30 mL Oral Q4H PRN Earney Navy, NP      . carvedilol (COREG) tablet 12.5 mg  12.5 mg Oral BID WC Verne Spurr, PA-C   12.5 mg at 12/29/12 0814  . [START ON  12/30/2012] citalopram (CELEXA) tablet 30 mg  30 mg Oral Daily Fransisca Kaufmann, NP      . furosemide (LASIX) tablet 40 mg  40 mg Oral Daily Verne Spurr, PA-C   40 mg at 12/29/12 0815  . glipiZIDE (GLUCOTROL) tablet 10 mg  10 mg Oral BID AC Verne Spurr, PA-C   10 mg at 12/29/12 0645  . guaiFENesin (MUCINEX) 12 hr tablet 600 mg  600 mg Oral BID Kerry Hough, PA-C   600 mg at 12/29/12 0815  . hydrOXYzine (ATARAX/VISTARIL) tablet 25 mg  25 mg Oral Q6H PRN Fransisca Kaufmann, NP   25 mg at 12/29/12 1433  . insulin aspart (novoLOG) injection 0-9 Units  0-9 Units Subcutaneous TID WC Sanjuana Kava, NP      . insulin aspart (novoLOG) injection 3 Units  3 Units Subcutaneous TID WC Sanjuana Kava, NP      . lisinopril (PRINIVIL,ZESTRIL) tablet 40 mg  40 mg Oral Daily Verne Spurr, PA-C   40 mg at 12/29/12 0816  . magnesium hydroxide (MILK OF MAGNESIA) suspension 30 mL  30 mL Oral Daily PRN Earney Navy, NP   30 mL at 12/27/12 2153  . multivitamin with minerals tablet 1 tablet  1 tablet Oral Daily Verne Spurr, PA-C   1 tablet at 12/29/12 0816  . nitroGLYCERIN (NITROSTAT) SL tablet 0.4 mg  0.4 mg Sublingual Q5 min PRN Verne Spurr, PA-C      . omega-3 acid ethyl esters (LOVAZA) capsule 1 g  1 g Oral Daily Verne Spurr, PA-C   1 g at 12/29/12 0816  . spironolactone (ALDACTONE) tablet 25 mg  25 mg Oral Daily Verne Spurr, PA-C   25 mg at 12/29/12 0817  . traZODone (DESYREL) tablet 100 mg  100 mg Oral QHS,MR X 1 Fransisca Kaufmann, NP        Lab Results:  Results for orders placed during the hospital encounter of 12/26/12 (from the past 48 hour(s))  GLUCOSE, CAPILLARY     Status: None   Collection Time    12/27/12  5:07 PM      Result Value Range   Glucose-Capillary 83  70 - 99 mg/dL  GLUCOSE, CAPILLARY     Status: Abnormal   Collection Time    12/27/12  8:29 PM      Result Value Range   Glucose-Capillary 103 (*) 70 - 99 mg/dL   Comment 1 Notify RN     Comment 2 Documented in Chart    GLUCOSE, CAPILLARY      Status: None   Collection Time    12/28/12  5:58 AM      Result Value Range   Glucose-Capillary 97  70 - 99 mg/dL   Comment 1 Notify RN     Comment 2 Documented in Chart    GLUCOSE, CAPILLARY     Status: Abnormal   Collection Time  12/28/12 11:53 AM      Result Value Range   Glucose-Capillary 111 (*) 70 - 99 mg/dL  GLUCOSE, CAPILLARY     Status: Abnormal   Collection Time    12/28/12  5:08 PM      Result Value Range   Glucose-Capillary 120 (*) 70 - 99 mg/dL  GLUCOSE, CAPILLARY     Status: None   Collection Time    12/28/12  9:26 PM      Result Value Range   Glucose-Capillary 93  70 - 99 mg/dL   Comment 1 Notify RN    GLUCOSE, CAPILLARY     Status: None   Collection Time    12/29/12  5:54 AM      Result Value Range   Glucose-Capillary 84  70 - 99 mg/dL  GLUCOSE, CAPILLARY     Status: None   Collection Time    12/29/12 11:32 AM      Result Value Range   Glucose-Capillary 84  70 - 99 mg/dL   Comment 1 Documented in Chart     Comment 2 Notify RN      Physical Findings: AIMS: Facial and Oral Movements Muscles of Facial Expression: None, normal Lips and Perioral Area: None, normal Jaw: None, normal Tongue: None, normal,Extremity Movements Upper (arms, wrists, hands, fingers): None, normal Lower (legs, knees, ankles, toes): None, normal, Trunk Movements Neck, shoulders, hips: None, normal, Overall Severity Severity of abnormal movements (highest score from questions above): None, normal Incapacitation due to abnormal movements: None, normal Patient's awareness of abnormal movements (rate only patient's report): No Awareness, Dental Status Current problems with teeth and/or dentures?: No Does patient usually wear dentures?: No  CIWA:  CIWA-Ar Total: 1 COWS:  COWS Total Score: 0  Treatment Plan Summary: Daily contact with patient to assess and evaluate symptoms and progress in treatment Medication management  Plan: Continue crisis management and stabilization.   Medication management: Reviewed medications with patient who stated no untoward effects. Increase Celexa to 30 mg daily to help improve symptoms of depression. Increase Trazodone to 100 mg hs with repeat dose to help improve the quality of patient's sleep.  Encouraged patient to attend groups and participate in group counseling sessions and activities.  Discharge plan in progress.  Address health issues: Vitals reviewed and stable. CBG's reviewed which are ranging from 84-120. Continue current treatment plan.   Medical Decision Making Problem Points:  Established problem, stable/improving (1) and Review of psycho-social stressors (1) Data Points:  Review of medication regiment & side effects (2)  I certify that inpatient services furnished can reasonably be expected to improve the patient's condition.   Emmabelle Fear NP-C 12/29/2012, 3:44 PM

## 2012-12-30 ENCOUNTER — Encounter: Payer: Self-pay | Admitting: Cardiology

## 2012-12-30 ENCOUNTER — Encounter (HOSPITAL_COMMUNITY): Payer: BC Managed Care – PPO

## 2012-12-30 LAB — GLUCOSE, CAPILLARY
Glucose-Capillary: 102 mg/dL — ABNORMAL HIGH (ref 70–99)
Glucose-Capillary: 77 mg/dL (ref 70–99)
Glucose-Capillary: 74 mg/dL (ref 70–99)
Glucose-Capillary: 108 mg/dL — ABNORMAL HIGH (ref 70–99)

## 2012-12-30 MED ORDER — CITALOPRAM HYDROBROMIDE 40 MG PO TABS
40.0000 mg | ORAL_TABLET | Freq: Every day | ORAL | Status: DC
  Administered 2012-12-31: 40 mg via ORAL
  Filled 2012-12-30 (×3): qty 1

## 2012-12-30 MED ORDER — TRAZODONE HCL 50 MG PO TABS
150.0000 mg | ORAL_TABLET | Freq: Every evening | ORAL | Status: DC | PRN
  Administered 2012-12-30 – 2013-01-04 (×5): 150 mg via ORAL
  Filled 2012-12-30 (×7): qty 1
  Filled 2012-12-30: qty 24
  Filled 2012-12-30 (×5): qty 1
  Filled 2012-12-30 (×2): qty 24
  Filled 2012-12-30 (×2): qty 1

## 2012-12-30 MED ORDER — HYDROXYZINE HCL 50 MG PO TABS
50.0000 mg | ORAL_TABLET | Freq: Four times a day (QID) | ORAL | Status: DC | PRN
  Administered 2012-12-30 – 2012-12-31 (×2): 50 mg via ORAL

## 2012-12-30 NOTE — [facility] Group Notes (Signed)
[facility] LCSW Group Therapy  Emotional Regulation 1:15 - 2: 30 PM        12/30/2012  3:15 PM   Type of Therapy:  Group Therapy  Participation Level:  Appropriate  Participation Quality:  Appropriate  Affect:  Appropriate  Cognitive:  Attentive Appropriate  Insight:  Engaged  Engagement in Therapy:  Engaged  Modes of Intervention:  Discussion Exploration Problem-Solving Supportive  Summary of Progress/Problems:  Group topic was emotional regulations.  Patient participated in the discussion and was able to identify an emotion that needed to regulated.   He shared he trying to accept the fact that his marriage of 33 years may be over Patient was able to identify approprite coping skills of spending time with his family.  Wynn Banker 12/30/2012 3:15 PM        Rosevelt Luu, Joesph July 12/30/2012, 3:15 PM

## 2012-12-30 NOTE — Tx Team (Signed)
Interdisciplinary Treatment Plan Update   Date Reviewed:  12/30/2012  Time Reviewed:  9:39 AM  Progress in Treatment:   Attending groups: Yes Participating in groups: Yes Taking medication as prescribed: Yes  Tolerating medication: Yes Family/Significant other contact made: Yes, contact made with wife Patient understands diagnosis: Yes  Discussing patient identified problems/goals with staff: Yes Medical problems stabilized or resolved: Yes Denies suicidal/homicidal ideation:  No, but contracts for safety Patient has not harmed self or others: Yes  For review of initial/current patient goals, please see plan of care.  Estimated Length of Stay: 2-3 days  Reasons for Continued Hospitalization:  Anxiety Depression Medication stabilization Suicidal ideation  New Problems/Goals identified:    Discharge Plan or Barriers:   Home with outpatient follow up [facility]  Additional Comments: N/A  Attendees:  Patient:  12/30/2012 9:39 AM   Signature: Mervyn Gay, MD 12/30/2012 9:39 AM  Signature: 12/30/2012 9:39 AM  Signature: Harold Barban, RN 12/30/2012 9:39 AM  Signature: Nestor Ramp, RN 12/30/2012 9:39 AM  Signature:  12/30/2012 9:39 AM  Signature:  Juline Patch, LCSW 12/30/2012 9:39 AM  Signature:  Reyes Ivan, LCSW 12/30/2012 9:39 AM  Signature:  Sharin Grave Coordinator 12/30/2012 9:39 AM  Signature: Fransisca Kaufmann, [facility] 12/30/2012 9:39 AM  Signature:    Signature:    Signature:      Scribe for Treatment Team:   Juline Patch,  12/30/2012 9:39 AM

## 2012-12-30 NOTE — Progress Notes (Signed)
Writer observed patient up in the dayroom watching tv. Patient attended group this evening. Writer spoke with patient 1:1 and he reports that his day has been ok. Patient informed of scheduled meds and is agreeable to taking them later on. Patient focused on talking with sister on the phone he reports that she is helping him fiil out disability paperwork. Patient reports passive si, reports he felt that way in the am but not now. Patient denies hi/a/v halluciantions.. Support and encouragement offered, safety maintained on unit with 15 min checks, will continue to monitor.
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
## 2012-12-30 NOTE — [facility] Group Notes (Signed)
[facility] LCSW Aftercare Discharge Planning Group Note   12/30/2012 10:40 AM  Participation Quality:  Approprite  Mood/Affect:  Appropriate and Depressed  Depression Rating: 8  Anxiety Rating: 9  Thoughts of Suicide:  Yes  Will you contract for safety?  Yes  Current AVH:  No  Plan for Discharge/Comments:  Patient reports not doing well today and being more depressed.  He reports having SI last night.  Transportation Means:   Patient has transportation.   Supports:  Patient has a good support system.   Marshia Tropea, Joesph July

## 2012-12-30 NOTE — Progress Notes (Signed)
D: Patient having passive SI but verbally agrees to contract for safety. Patient states that these thoughts are "on and off constantly." Patient denies HI and auditory and visual hallucinations. The patient has a depressed mood and affect. The patient rates his depression a 7 out of 10 and his hopelessness an 8 out of 10 (1 low/10 high). The patient reports sleeping fairly well and writes that his appetite is improving but that his energy level is still low. The patient is attending groups on the unit and is interacting appropriately within the milieu.  A: Patient given emotional support from RN. Patient encouraged to come to staff with concerns and/or questions. Patient's medication routine continued. Patient's orders and plan of care reviewed. Patient given instruction to let RN know if SI worsens.  R: Patient remains cooperative. Will continue to monitor patient q15 minutes for safety.

## 2012-12-30 NOTE — Progress Notes (Signed)
Recreation Therapy Notes   Date: 06.25.2014 Time: 3:00pm Location: [address] Dayroom      Group Topic/Focus: Teamwork, Communication, Problem Solving.   Participation Level: Active  Participation Quality: Appropriate  Affect: Euthymic  Cognitive: Appropriate  Additional Comments: Activity: Cup Selina Cooley ; Explanation: Patients were given 10 cups and asked to build a pyramid out of the cups using string tied to a rubber band. Patients were instructed not to touch the cups with their hands.   Patient with peers successful at building pyramid. Patient with peers had to stop at one point and restrategize. Patient listened to wrap up discussion about the skills needed to complete this activity.   Marykay Lex Devrin Monforte, LRT/CTRS  Jearl Klinefelter 12/30/2012 4:46 PM

## 2012-12-30 NOTE — Progress Notes (Signed)
D: Patient in the hallway on approach.  Patient states he has been feeling down today.  Patient states there is conflict between he and his wife.  Patient states denies SI/HI and denies AVH. A: Staff to monitor Q 15 mins for safety.  Encouragement and support offered.  Scheduled medications administered per orders.  Vistaril administered prn for anxiety. R: Patient remains safe on the unit.  Patient attended group tonight.  Patient cooperative and taking administered medications.  Patient anxiety decreased.  Patient visible on the unit and interacting with peers.

## 2012-12-30 NOTE — Progress Notes (Signed)
Adult Psychoeducational Group Note  Date:  12/30/2012 Time:  12:03 PM  Group Topic/Focus:  Crisis Planning:   The purpose of this group is to help patients create a crisis plan for use upon discharge or in the future, as needed.  Participation Level:  Minimal  Participation Quality:  Attentive  Affect:  Appropriate  Cognitive:  Appropriate  Insight: Limited  Engagement in Group:  Limited  Modes of Intervention:  Discussion, Education and Support  Additional Comments:  Ryelan had limited participation in group.  Louanne Belton 12/30/2012, 12:03 PM

## 2012-12-31 LAB — GLUCOSE, CAPILLARY
Glucose-Capillary: 79 mg/dL (ref 70–99)
Glucose-Capillary: 97 mg/dL (ref 70–99)
Glucose-Capillary: 160 mg/dL — ABNORMAL HIGH (ref 70–99)
Glucose-Capillary: 147 mg/dL — ABNORMAL HIGH (ref 70–99)

## 2012-12-31 MED ORDER — CITALOPRAM HYDROBROMIDE 40 MG PO TABS
60.0000 mg | ORAL_TABLET | Freq: Every day | ORAL | Status: DC
  Filled 2012-12-31 (×2): qty 1

## 2012-12-31 NOTE — [facility] Suicide Risk Assessment (Signed)
[facility] INPATIENT:  Family/Significant Other Suicide Prevention Education  Suicide Prevention Education:  Education Completed; Ananias Kolander, Sister, [phone]; has been identified by the patient as the family member/significant other with whom the patient will be residing, and identified as the person(s) who will aid the patient in the event of a mental health crisis (suicidal ideations/suicide attempt).  With written consent from the patient, the family member/significant other has been provided the following suicide prevention education, prior to the and/or following the discharge of the patient.  The suicide prevention education provided includes the following:  Suicide risk factors  Suicide prevention and interventions  National Suicide Hotline telephone number  [facility] assessment telephone number  [facility] Emergency Assistance 911  [facility] and/or Residential Mobile Crisis Unit telephone number  Request made of family/significant other to:  Remove weapons (e.g., guns, rifles, knives), all items previously/currently identified as safety concern.  Sister reports patient has no access to guns.  Remove drugs/medications (over-the-counter, prescriptions, illicit drugs), all items previously/currently identified as a safety concern.  The family member/significant other verbalizes understanding of the suicide prevention education information provided.  The family member/significant other agrees to remove the items of safety concern listed above.  Wynn Banker 12/31/2012, 3:46 PM

## 2012-12-31 NOTE — [facility] Group Notes (Signed)
[facility] LCSW Aftercare Discharge Planning Group Note   12/31/2012 2:45 PM  Participation Quality:  Approprite  Mood/Affect:  Appropriate and Depressed  Depression Rating: 8  Anxiety Rating: 9  Thoughts of Suicide:  Yes  Will you contract for safety?  Yes  Current AVH:  No  Plan for Discharge/Comments:  Patient continues to endorse off/on SI but contracts for safety.  Transportation Means:   Patient has transportation.   Supports:  Patient has a good support system.   Marney Treloar, Joesph July
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
## 2012-12-31 NOTE — Progress Notes (Signed)
Adult Psychoeducational Group Note  Date:  12/31/2012 Time:  3:00 PM  Group Topic/Focus:  Overcoming Stress:   The focus of this group is to define stress and help patients assess their triggers.  Participation Level:  Minimal  Participation Quality:  Appropriate  Affect:  Appropriate  Cognitive:  Appropriate  Insight: Good  Engagement in Group:  Engaged  Modes of Intervention:  Discussion, Education and Socialization  Additional Comments:    Cody Hale 12/31/2012, 3:00 PM

## 2012-12-31 NOTE — [facility] Group Notes (Signed)
[facility] LCSW Group Therapy  [employer] [facility] [place] 1:15 - 2:30 PM  12/31/2012. 2:46 PM   Type of Therapy:  Group Therapy  Participation Level:  Minimal  Participation Quality:  Attentive  Affect:  Appropriate, Depressed  Cognitive:  Appropriate  Insight:  Developing/Improving and Engaged  Engagement in Therapy:  Developing/Improving Engaged  Modes of Intervention:  Discussion, Education, Exploration, Problem-Solving, Rapport Building, Support   Summary of Progress/Problems:  Patient listened attentively to speaker from [employer]. Patient offered on comments on the presentation  Wynn Banker 12/31/2012  2:46 PM
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
## 2012-12-31 NOTE — Progress Notes (Addendum)
D: Patient having passive SI but verbally agrees to contract for safety. Patient states that these thoughts are "on and off and usually random." Patient denies HI and auditory and visual hallucinations. The patient has a depressed mood and affect. The patient rates his depression a 6 out of 10 and his hopelessness a 7 out of 10 (1 low/10 high). The patient reports sleeping fairly well and writes that his appetite is improving but that his energy level is still low. The patient is attending groups on the unit and is interacting appropriately within the milieu. The patient states that he feels "a little better each day" and that he wants the depression to "go away soon."  A: Patient given emotional support from RN. Patient encouraged to come to staff with concerns and/or questions. Patient's medication routine continued. Patient's orders and plan of care reviewed. Patient given instruction to let RN know if SI worsens.   R: Patient remains cooperative. Will continue to monitor patient q15 minutes for safety.
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
## 2013-01-01 ENCOUNTER — Encounter (HOSPITAL_COMMUNITY): Payer: BC Managed Care – PPO

## 2013-01-01 LAB — GLUCOSE, CAPILLARY
Glucose-Capillary: 110 mg/dL — ABNORMAL HIGH (ref 70–99)
Glucose-Capillary: 102 mg/dL — ABNORMAL HIGH (ref 70–99)
Glucose-Capillary: 101 mg/dL — ABNORMAL HIGH (ref 70–99)
Glucose-Capillary: 156 mg/dL — ABNORMAL HIGH (ref 70–99)

## 2013-01-01 MED ORDER — CITALOPRAM HYDROBROMIDE 40 MG PO TABS
40.0000 mg | ORAL_TABLET | Freq: Every day | ORAL | Status: DC
  Administered 2013-01-01 – 2013-01-02 (×2): 40 mg via ORAL
  Filled 2013-01-01 (×4): qty 1
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
## 2013-01-01 NOTE — [facility] Group Notes (Signed)
[facility] LCSW Aftercare Discharge Planning Group Note   01/01/2013 12:27 PM  Participation Quality:  Approprite  Mood/Affect:  Appropriate and Depressed  Depression Rating: 9  Anxiety Rating:8  Thoughts of Suicide:  Yes  Will you contract for safety?  Yes  Current AVH:  No  Plan for Discharge/Comments:  Patient continues to endorse off/on SI but contracts for safety.  He stated be served it the 50B yesterday has increased his depression.  Patient informed Clinical research associate spoke with his sister.  Transportation Means:   Patient has transportation.   Supports:  Patient has a good support system.   Lanell Dubie, Joesph July

## 2013-01-01 NOTE — Progress Notes (Signed)
Patient ID: Cody Hale, male   DOB: [birth year], 62 y.o.   MRN: [id number]  D:  Pt endorses passive SI, but contracts for safety. Pt denies HI/AVH. Pt is pleasant and cooperative.  Pt states "my daughter and son visited today, that made today good, I feel better today I'm more up-beat". Pt appears with a brighter affect.   A: Pt was offered support and encouragement. Pt was given scheduled medications. Pt was encourage to attend groups. Q 15 minute checks were done for safety.   R:Pt attends groups and interacts well with peers and staff. Pt is taking medication. Pt has no complaints at this time .Pt receptive to treatment and safety maintained on unit.
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
## 2013-01-01 NOTE — Progress Notes (Signed)
Patient ID: Cody Hale, male   DOB: [birth year], 62 y.o.   MRN: [id number] Patient ID: Cody Hale, male   DOB: 1950-11-01, 63 y.o.   MRN: [id number] [facility] MD Progress Note  01/01/2013 3:23 PM Cody Hale  MRN:  [id number]  Subjective: Patient reports doing "some better" than yesterday. He reports still being upset about learning about his wife taking out the 65 B stating "It felt like my heart has been ripped out." The patient rates his depression at nine and feeling anxious at eight in relation to this. He states that if he left the hospital "I would probably try to find a pond to jump in." Patient is able to contract for safety on the unit. The patient feels betrayed by his wife stating "Some of the stuff written on that 50 B is not even true. I am trying my best to cope with things."   Diagnosis:   Axis I: Major Depression, Recurrent severe Axis II: Deferred Axis III:  Past Medical History  Diagnosis Date  . Diabetes mellitus, type II     Gastroparesis; GI care at [facility]  . Left leg cellulitis   . History of tobacco abuse   . Hypertension   . Degenerative joint disease   . Morbid obesity 06/27/2012  . 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 s/p zyvox rx.  (Cultures never positive); left bundle branch block; H/o SVT; 09/2012: bioprosthetic MVR at [facility]; a. Severe dental caries and cavities s/p multiple extractions.  . Depression   . Urinary tract infection 08/30/2012    Proteus mirabilis  . Chronic venous insufficiency   . Nephrolithiasis   . 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 Systolic heart failure 11/2012   Axis IV: other psychosocial or environmental problems and Chronic health issues Axis V: 41-50 serious symptoms  ADL's:  Intact  Sleep: Poor  Appetite:  Poor  Suicidal Ideation:  Plan:  [place] self in lake as he is unable to swim Intent:  Present Means:  Safety maintained in the hospital Homicidal Ideation:  Denies AEB (as evidenced  by):  Psychiatric Specialty Exam: Review of Systems  Constitutional: Negative.   HENT: Negative.   Eyes: Negative.   Respiratory: Negative.   Cardiovascular: Negative.   Gastrointestinal: Negative.   Genitourinary: Negative.   Musculoskeletal: Negative.   Skin: Negative.   Neurological: Negative.   Endo/Heme/Allergies: Negative.   Psychiatric/Behavioral: Positive for depression and suicidal ideas. Negative for hallucinations, memory loss and substance abuse. The patient is nervous/anxious and has insomnia.     Blood pressure 117/61, pulse 76, temperature 98.1 F (36.7 C), temperature source Oral, resp. rate 18, height 5\' 9"  (1.753 m), weight 116.574 kg (257 lb), SpO2 97.00%.Body mass index is 37.93 kg/(m^2).  General Appearance: Casual and Fairly Groomed  Patent attorney::  Good  Speech:  Clear and Coherent  Volume:  Normal  Mood:  Anxious, Dysphoric and Worthless  Affect:  Flat and Sad  Thought Process:  Goal Directed and Intact  Orientation:  Full (Time, Place, and Person)  Thought Content:  WDL  Suicidal Thoughts:  Yes.  with intent/plan  Homicidal Thoughts:  No  Memory:  Immediate;   Good Recent;   Fair Remote;   Fair  Judgement:  Impaired  Insight:  Present  Psychomotor Activity:  Normal  Concentration:  Fair  Recall:  Fair  Akathisia:  No  Handed:  Right  AIMS (if indicated):     Assets:  Communication Skills Desire for  Improvement Housing Intimacy Resilience Social Support  Sleep:  Number of Hours: 6.75   Current Medications: Current Facility-Administered Medications  Medication Dose Route Frequency Provider Last Rate Last Dose  . acetaminophen (TYLENOL) tablet 650 mg  650 mg Oral Q6H PRN Earney Navy, NP   650 mg at 01/01/13 1000  . alum & mag hydroxide-simeth (MAALOX/MYLANTA) 200-200-20 MG/5ML suspension 30 mL  30 mL Oral Q4H PRN Earney Navy, NP      . carvedilol (COREG) tablet 12.5 mg  12.5 mg Oral BID WC Verne Spurr, PA-C   12.5 mg at  01/01/13 0729  . citalopram (CELEXA) tablet 40 mg  40 mg Oral Daily Fransisca Kaufmann, NP   40 mg at 01/01/13 1212  . furosemide (LASIX) tablet 40 mg  40 mg Oral Daily Verne Spurr, PA-C   40 mg at 01/01/13 0729  . glipiZIDE (GLUCOTROL) tablet 10 mg  10 mg Oral BID AC Verne Spurr, PA-C   10 mg at 01/01/13 0651  . guaiFENesin (MUCINEX) 12 hr tablet 600 mg  600 mg Oral BID Kerry Hough, PA-C   600 mg at 01/01/13 0729  . hydrOXYzine (ATARAX/VISTARIL) tablet 50 mg  50 mg Oral Q6H PRN Nehemiah Settle, MD   50 mg at 12/31/12 1556  . insulin aspart (novoLOG) injection 0-9 Units  0-9 Units Subcutaneous TID WC Sanjuana Kava, NP      . insulin aspart (novoLOG) injection 3 Units  3 Units Subcutaneous TID WC Sanjuana Kava, NP   3 Units at 01/01/13 1151  . lisinopril (PRINIVIL,ZESTRIL) tablet 40 mg  40 mg Oral Daily Verne Spurr, PA-C   40 mg at 01/01/13 0729  . magnesium hydroxide (MILK OF MAGNESIA) suspension 30 mL  30 mL Oral Daily PRN Earney Navy, NP   30 mL at 12/27/12 2153  . multivitamin with minerals tablet 1 tablet  1 tablet Oral Daily Verne Spurr, PA-C   1 tablet at 01/01/13 0729  . nitroGLYCERIN (NITROSTAT) SL tablet 0.4 mg  0.4 mg Sublingual Q5 min PRN Verne Spurr, PA-C      . omega-3 acid ethyl esters (LOVAZA) capsule 1 g  1 g Oral Daily Verne Spurr, PA-C   1 g at 01/01/13 0729  . spironolactone (ALDACTONE) tablet 25 mg  25 mg Oral Daily Verne Spurr, PA-C   25 mg at 01/01/13 0729  . traZODone (DESYREL) tablet 150 mg  150 mg Oral QHS,MR X 1 Nehemiah Settle, MD   150 mg at 12/31/12 2256    Lab Results:  Results for orders placed during the hospital encounter of 12/26/12 (from the past 48 hour(s))  GLUCOSE, CAPILLARY     Status: None   Collection Time    12/30/12  5:07 PM      Result Value Range   Glucose-Capillary 74  70 - 99 mg/dL   Comment 1 Notify RN    GLUCOSE, CAPILLARY     Status: Abnormal   Collection Time    12/30/12  9:23 PM      Result Value  Range   Glucose-Capillary 108 (*) 70 - 99 mg/dL   Comment 1 Notify RN    GLUCOSE, CAPILLARY     Status: None   Collection Time    12/31/12  6:04 AM      Result Value Range   Glucose-Capillary 79  70 - 99 mg/dL  GLUCOSE, CAPILLARY     Status: None   Collection Time    12/31/12 11:47  AM      Result Value Range   Glucose-Capillary 97  70 - 99 mg/dL  GLUCOSE, CAPILLARY     Status: Abnormal   Collection Time    12/31/12  4:18 PM      Result Value Range   Glucose-Capillary 160 (*) 70 - 99 mg/dL  GLUCOSE, CAPILLARY     Status: Abnormal   Collection Time    12/31/12  8:04 PM      Result Value Range   Glucose-Capillary 147 (*) 70 - 99 mg/dL   Comment 1 Notify RN    GLUCOSE, CAPILLARY     Status: Abnormal   Collection Time    01/01/13  6:30 AM      Result Value Range   Glucose-Capillary 110 (*) 70 - 99 mg/dL  GLUCOSE, CAPILLARY     Status: Abnormal   Collection Time    01/01/13 11:50 AM      Result Value Range   Glucose-Capillary 102 (*) 70 - 99 mg/dL    Physical Findings: AIMS: Facial and Oral Movements Muscles of Facial Expression: None, normal Lips and Perioral Area: None, normal Jaw: None, normal Tongue: None, normal,Extremity Movements Upper (arms, wrists, hands, fingers): None, normal Lower (legs, knees, ankles, toes): None, normal, Trunk Movements Neck, shoulders, hips: None, normal, Overall Severity Severity of abnormal movements (highest score from questions above): None, normal Incapacitation due to abnormal movements: None, normal Patient's awareness of abnormal movements (rate only patient's report): No Awareness, Dental Status Current problems with teeth and/or dentures?: No Does patient usually wear dentures?: No  CIWA:  CIWA-Ar Total: 1 COWS:  COWS Total Score: 0  Treatment Plan Summary: Daily contact with patient to assess and evaluate symptoms and progress in treatment Medication management  Plan: Continue crisis management and stabilization.   Medication management: Reviewed medications with patient who stated no untoward effects. Decrease Celexa to 40 mg due to concern for QT interval prolongation on doses past 40 mg.  Encouraged patient to attend groups and participate in group counseling sessions and activities.  Discharge plan in progress.  Address health issues: Vitals reviewed and stable.  Continue current treatment plan.   Medical Decision Making Problem Points:  Established problem, stable/improving (1) and Review of psycho-social stressors (1) Data Points:  Review of medication regiment & side effects (2)  I certify that inpatient services furnished can reasonably be expected to improve the patient's condition.   Cody Rinck NP-C 01/01/2013, 3:23 PM

## 2013-01-01 NOTE — Progress Notes (Signed)
Adult Psychoeducational Group Note  Date:  01/01/2013 Time:  8:00PM Group Topic/Focus:  Wrap-Up Group:   The focus of this group is to help patients review their daily goal of treatment and discuss progress on daily workbooks.  Participation Level:  Active  Participation Quality:  Appropriate and Attentive  Affect:  Appropriate  Cognitive:  Alert and Appropriate  Insight: Appropriate  Engagement in Group:  Engaged  Modes of Intervention:  Discussion  Additional Comments:  Pt. Was attentive and appropriate during tonight's group discussion. Pt stated that his family came to visit and that is always a good support system. Pt. Stated that he enjoyed social work group today and karaoke last night.    [name] 01/01/2013, 9:04 PM

## 2013-01-01 NOTE — Tx Team (Signed)
Interdisciplinary Treatment Plan Update   Date Reviewed:  01/01/2013  Time Reviewed:  9:49 AM  Progress in Treatment:   Attending groups: Yes Participating in groups: Yes Taking medication as prescribed: Yes  Tolerating medication: Yes Family/Significant other contact made: Yes, contact made with wife and sister Patient understands diagnosis: Yes  Discussing patient identified problems/goals with staff: Yes Medical problems stabilized or resolved: Yes Denies suicidal/homicidal ideation:  No, but contracts for safety Patient has not harmed self or others: Yes  For review of initial/current patient goals, please see plan of care.  Estimated Length of Stay:  3-4 days  Reasons for Continued Hospitalization:  Anxiety Depression Medication stabilization Suicidal ideation  New Problems/Goals identified:    Discharge Plan or Barriers:   Home with outpatient follow up [facility]  Additional Comments:    Patient was served with a 50-B from wife yesterday as a result patient is endorsing SI and rates depression at nine and anxiety at ten.  Attendees:  Patient:  01/01/2013 9:49 AM   Signature: Mervyn Gay, MD 01/01/2013 9:49 AM  Signature: 01/01/2013 9:49 AM  Signature: Harold Barban, RN 01/01/2013 9:49 AM  Signature: Nestor Ramp, RN 01/01/2013 9:49 AM  Signature:  01/01/2013 9:49 AM  Signature:  Juline Patch, LCSW 01/01/2013 9:49 AM  Signature:  Reyes Ivan, LCSW 01/01/2013 9:49 AM  Signature:  Sharin Grave Coordinator 01/01/2013 9:49 AM  Signature: Fransisca Kaufmann, [facility] 01/01/2013 9:49 AM  Signature:    Signature:    Signature:      Scribe for Treatment Team:   Juline Patch,  01/01/2013 9:49 AM

## 2013-01-02 LAB — GLUCOSE, CAPILLARY
Glucose-Capillary: 122 mg/dL — ABNORMAL HIGH (ref 70–99)
Glucose-Capillary: 162 mg/dL — ABNORMAL HIGH (ref 70–99)
Glucose-Capillary: 211 mg/dL — ABNORMAL HIGH (ref 70–99)
Glucose-Capillary: 192 mg/dL — ABNORMAL HIGH (ref 70–99)

## 2013-01-02 MED ORDER — CITALOPRAM HYDROBROMIDE 20 MG PO TABS
20.0000 mg | ORAL_TABLET | Freq: Every day | ORAL | Status: DC
  Administered 2013-01-04 – 2013-01-05 (×2): 20 mg via ORAL
  Filled 2013-01-02 (×2): qty 1
  Filled 2013-01-02: qty 3
  Filled 2013-01-02 (×2): qty 1

## 2013-01-02 MED ORDER — PROMETHAZINE HCL 25 MG/ML IJ SOLN
25.0000 mg | Freq: Four times a day (QID) | INTRAMUSCULAR | Status: DC | PRN
  Administered 2013-01-02: 25 mg via INTRAMUSCULAR
  Filled 2013-01-02: qty 1

## 2013-01-02 MED ORDER — BUPROPION HCL ER (XL) 150 MG PO TB24
150.0000 mg | ORAL_TABLET | Freq: Every day | ORAL | Status: DC
  Administered 2013-01-02 – 2013-01-05 (×3): 150 mg via ORAL
  Filled 2013-01-02 (×5): qty 1
  Filled 2013-01-02: qty 3

## 2013-01-02 MED ORDER — METOCLOPRAMIDE HCL 5 MG PO TABS
10.0000 mg | ORAL_TABLET | Freq: Three times a day (TID) | ORAL | Status: DC
  Administered 2013-01-02 – 2013-01-05 (×10): 10 mg via ORAL
  Filled 2013-01-02 (×8): qty 1
  Filled 2013-01-02: qty 12
  Filled 2013-01-02 (×4): qty 1
  Filled 2013-01-02: qty 12
  Filled 2013-01-02 (×2): qty 1
  Filled 2013-01-02: qty 12
  Filled 2013-01-02 (×4): qty 1

## 2013-01-02 MED ORDER — ONDANSETRON 4 MG PO TBDP
4.0000 mg | ORAL_TABLET | Freq: Three times a day (TID) | ORAL | Status: DC | PRN
  Administered 2013-01-02 – 2013-01-04 (×2): 4 mg via ORAL

## 2013-01-02 NOTE — Progress Notes (Signed)
Psychoeducational Group Note  Date:  01/02/2013 Time:  2000  Group Topic/Focus:  Wrap-Up Group:   The focus of this group is to help patients review their daily goal of treatment and discuss progress on daily workbooks.  Participation Level: Did Not Attend  Participation Quality:  Not Applicable  Affect:  Not Applicable  Cognitive:  Not Applicable  Insight:  Not Applicable  Engagement in Group: Not Applicable  Additional Comments:  The patient did not attend group this evening since he was asleep in his bed.   Hazle Coca S 01/02/2013, 9:43 PM

## 2013-01-02 NOTE — Progress Notes (Signed)
Patient ID: Cody Hale, male   DOB: 12/18/1950, 62 y.o.   MRN: [id number]  [facility] MD Progress Note  01/02/2013 11:46 AM Cody Hale  MRN:  [id number]  Subjective:  [name] reports not feeling well today from an episode of vomiting he experienced this morning. Patient reports that he is "coming to terms with separation from wife. I'm still knocked back by what she did. I did not expect that from her. He is still having passive SI with intent to hurt self but states "I'm trying hard to not pay them attention. But they are still there." Patient is working with his sister to help him start a new life after discharge. The patient remains overwhelmed with having to start over without his wife. He remains severely depressed with main stressor still present being recent separation from wife of nearly thirty years.   Diagnosis:   Axis I: Major Depression, Recurrent severe Axis II: Deferred Axis III:  Past Medical History  Diagnosis Date  . Diabetes mellitus, type II     Gastroparesis; GI care at [facility]  . Left leg cellulitis   . History of tobacco abuse   . Hypertension   . Degenerative joint disease   . Morbid obesity 06/27/2012  . 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 s/p zyvox rx.  (Cultures never positive); left bundle branch block; H/o SVT; 09/2012: bioprosthetic MVR at [facility]; a. Severe dental caries and cavities s/p multiple extractions.  . Depression   . Urinary tract infection 08/30/2012    Proteus mirabilis  . Chronic venous insufficiency   . Nephrolithiasis   . 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 Systolic heart failure 11/2012   Axis IV: other psychosocial or environmental problems and Chronic health issues Axis V: 41-50 serious symptoms  ADL's:  Intact  Sleep: Fair  Appetite:  Poor  Suicidal Ideation:  Plan:  [place] as he is unable to swim Intent:  Present Means:  Safety maintained in the hospital Homicidal Ideation:  Denies AEB (as  evidenced by):  Psychiatric Specialty Exam: Review of Systems  Constitutional: Negative.   HENT: Negative.   Eyes: Negative.   Respiratory: Negative.   Cardiovascular: Negative.   Gastrointestinal: Negative.   Genitourinary: Negative.   Musculoskeletal: Negative.   Skin: Negative.   Neurological: Negative.   Endo/Heme/Allergies: Negative.   Psychiatric/Behavioral: Positive for depression and suicidal ideas. Negative for hallucinations, memory loss and substance abuse. The patient is nervous/anxious and has insomnia.     Blood pressure 100/56, pulse 71, temperature 97.6 F (36.4 C), temperature source Oral, resp. rate 16, height 5\' 9"  (1.753 m), weight 116.574 kg (257 lb), SpO2 97.00%.Body mass index is 37.93 kg/(m^2).  General Appearance: Casual and Fairly Groomed  Patent attorney::  Good  Speech:  Clear and Coherent  Volume:  Normal  Mood:  Depressed  Affect:  Flat and Sad  Thought Process:  Goal Directed and Intact  Orientation:  Full (Time, Place, and Person)  Thought Content:  WDL  Suicidal Thoughts:  Yes.  with intent/plan  Homicidal Thoughts:  No  Memory:  Immediate;   Good Recent;   Fair Remote;   Fair  Judgement:  Impaired  Insight:  Present  Psychomotor Activity:  Normal  Concentration:  Fair  Recall:  Fair  Akathisia:  No  Handed:  Right  AIMS (if indicated):     Assets:  Communication Skills Desire for Improvement Housing Intimacy Resilience Social Support  Sleep:  Number of  Hours: 6   Current Medications: Current Facility-Administered Medications  Medication Dose Route Frequency Provider Last Rate Last Dose  . acetaminophen (TYLENOL) tablet 650 mg  650 mg Oral Q6H PRN Earney Navy, NP   650 mg at 01/01/13 1000  . alum & mag hydroxide-simeth (MAALOX/MYLANTA) 200-200-20 MG/5ML suspension 30 mL  30 mL Oral Q4H PRN Earney Navy, NP      . buPROPion (WELLBUTRIN XL) 24 hr tablet 150 mg  150 mg Oral Daily Fransisca Kaufmann, NP      . carvedilol (COREG)  tablet 12.5 mg  12.5 mg Oral BID WC Verne Spurr, PA-C   12.5 mg at 01/02/13 0827  . [START ON 01/03/2013] citalopram (CELEXA) tablet 20 mg  20 mg Oral Daily Fransisca Kaufmann, NP      . furosemide (LASIX) tablet 40 mg  40 mg Oral Daily Verne Spurr, PA-C   40 mg at 01/02/13 0827  . glipiZIDE (GLUCOTROL) tablet 10 mg  10 mg Oral BID AC Verne Spurr, PA-C   10 mg at 01/02/13 1610  . guaiFENesin (MUCINEX) 12 hr tablet 600 mg  600 mg Oral BID Kerry Hough, PA-C   600 mg at 01/02/13 0827  . hydrOXYzine (ATARAX/VISTARIL) tablet 50 mg  50 mg Oral Q6H PRN Nehemiah Settle, MD   50 mg at 12/31/12 1556  . insulin aspart (novoLOG) injection 0-9 Units  0-9 Units Subcutaneous TID WC Sanjuana Kava, NP   1 Units at 01/02/13 [phone]  . insulin aspart (novoLOG) injection 3 Units  3 Units Subcutaneous TID WC Sanjuana Kava, NP   3 Units at 01/02/13 [phone]  . lisinopril (PRINIVIL,ZESTRIL) tablet 40 mg  40 mg Oral Daily Verne Spurr, PA-C   40 mg at 01/02/13 8119  . magnesium hydroxide (MILK OF MAGNESIA) suspension 30 mL  30 mL Oral Daily PRN Earney Navy, NP   30 mL at 12/27/12 2153  . multivitamin with minerals tablet 1 tablet  1 tablet Oral Daily Verne Spurr, PA-C   1 tablet at 01/02/13 1478  . nitroGLYCERIN (NITROSTAT) SL tablet 0.4 mg  0.4 mg Sublingual Q5 min PRN Verne Spurr, PA-C      . omega-3 acid ethyl esters (LOVAZA) capsule 1 g  1 g Oral Daily Verne Spurr, PA-C   1 g at 01/02/13 0827  . ondansetron (ZOFRAN-ODT) disintegrating tablet 4 mg  4 mg Oral Q8H PRN Fransisca Kaufmann, NP      . spironolactone (ALDACTONE) tablet 25 mg  25 mg Oral Daily Verne Spurr, PA-C   25 mg at 01/02/13 2956  . traZODone (DESYREL) tablet 150 mg  150 mg Oral QHS,MR X 1 Nehemiah Settle, MD   150 mg at 01/01/13 2247    Lab Results:  Results for orders placed during the hospital encounter of 12/26/12 (from the past 48 hour(s))  GLUCOSE, CAPILLARY     Status: None   Collection Time    12/31/12 11:47 AM       Result Value Range   Glucose-Capillary 97  70 - 99 mg/dL  GLUCOSE, CAPILLARY     Status: Abnormal   Collection Time    12/31/12  4:18 PM      Result Value Range   Glucose-Capillary 160 (*) 70 - 99 mg/dL  GLUCOSE, CAPILLARY     Status: Abnormal   Collection Time    12/31/12  8:04 PM      Result Value Range   Glucose-Capillary 147 (*) 70 - 99 mg/dL  Comment 1 Notify RN    GLUCOSE, CAPILLARY     Status: Abnormal   Collection Time    01/01/13  6:30 AM      Result Value Range   Glucose-Capillary 110 (*) 70 - 99 mg/dL  GLUCOSE, CAPILLARY     Status: Abnormal   Collection Time    01/01/13 11:50 AM      Result Value Range   Glucose-Capillary 102 (*) 70 - 99 mg/dL  GLUCOSE, CAPILLARY     Status: Abnormal   Collection Time    01/01/13  4:56 PM      Result Value Range   Glucose-Capillary 101 (*) 70 - 99 mg/dL  GLUCOSE, CAPILLARY     Status: Abnormal   Collection Time    01/01/13  8:42 PM      Result Value Range   Glucose-Capillary 156 (*) 70 - 99 mg/dL  GLUCOSE, CAPILLARY     Status: Abnormal   Collection Time    01/02/13  6:13 AM      Result Value Range   Glucose-Capillary 122 (*) 70 - 99 mg/dL    Physical Findings: AIMS: Facial and Oral Movements Muscles of Facial Expression: None, normal Lips and Perioral Area: None, normal Jaw: None, normal Tongue: None, normal,Extremity Movements Upper (arms, wrists, hands, fingers): None, normal Lower (legs, knees, ankles, toes): None, normal, Trunk Movements Neck, shoulders, hips: None, normal, Overall Severity Severity of abnormal movements (highest score from questions above): None, normal Incapacitation due to abnormal movements: None, normal Patient's awareness of abnormal movements (rate only patient's report): No Awareness, Dental Status Current problems with teeth and/or dentures?: No Does patient usually wear dentures?: No  CIWA:  CIWA-Ar Total: 1 COWS:  COWS Total Score: 0  Treatment Plan Summary: Daily contact with  patient to assess and evaluate symptoms and progress in treatment Medication management  Plan: Continue crisis management and stabilization.  Medication management: Reviewed medications with patient who stated no untoward effects. Decrease Celexa to 20 mg daily. Start Wellbutrin XL 150 mg to target symptoms of continued depression.  Encouraged patient to attend groups and participate in group counseling sessions and activities.  Discharge plan in progress.  Address health issues: Vitals reviewed and stable. Patient reports an episode of vomiting after meal this morning. Order Zofran 4 mg po every eight hours as needed for nausea.  Continue current treatment plan.   Medical Decision Making Problem Points:  Established problem, stable/improving (1) and Review of psycho-social stressors (1) Data Points:  Review of medication regiment & side effects (2)  I certify that inpatient services furnished can reasonably be expected to improve the patient's condition.   Anson Peddie NP-C 01/02/2013, 11:46 AM

## 2013-01-02 NOTE — Progress Notes (Signed)
 .  Psychoeducational Group Note    Date: 01/02/2013 Time:  @T @    Goal Setting Purpose of Group: To be able to set a goal that is measurable and that can be accomplished in one day Participation Level:  Did not attend  Dione Housekeeper

## 2013-01-02 NOTE — [facility] Group Notes (Signed)
[facility] Group Notes: (Clinical Social Work)   01/02/2013      Type of Therapy:  Group Therapy   Participation Level:  Did Not Attend    Ambrose Mantle, LCSW 01/02/2013, 5:01 PM

## 2013-01-02 NOTE — Progress Notes (Signed)
Psychoeducational Group Note  Date: 01/02/2013 Time:  1015  Group Topic/Focus:  Identifying Needs:   The focus of this group is to help patients identify their personal needs that have been historically problematic and identify healthy behaviors to address their needs.  Participation Level:  Did Not Attend Cody Hale 

## 2013-01-02 NOTE — Progress Notes (Signed)
D) Pt has been in his bed all day today. Has vomited several times stating "this happens frequently because of my Gastroparesis. States "if I lie in bed it eventually passes".  Order received for Zofran, which did not help Pt. He continued to vomit. Pt given Phenegran 25 mg and that helped Pt to calm his nausea. Rates his depression at a 2 and his hoplessness at a 1. Denies SI and HI on his self inventory today. A) Given support and reassurance. Encouraged to stay in bed and checked on frequently.  R) Denies SI and HI.

## 2013-01-02 NOTE — Progress Notes (Signed)
Patient ID: Cody Hale, male   DOB: 08/10/1950, 62 y.o.   MRN: [id number]  D: Pt endorses passive SI, but contracts for safety. Pt denies HI/AVH. Pt is pleasant and cooperative. Pt not feeling well due to Gastroparesis. Pt observed in bed sleeping upon approach. Pt did get up and walk on the unit for a  Little while, but went back to bed after a few minutes.    A: Pt was offered support and encouragement. Pt was given scheduled medications. Pt was encourage to attend groups. Q 15 minute checks were done for safety.    R: Pt is taking medication.Pt receptive to treatment and safety maintained on unit.

## 2013-01-03 LAB — GLUCOSE, CAPILLARY
Glucose-Capillary: 110 mg/dL — ABNORMAL HIGH (ref 70–99)
Glucose-Capillary: 62 mg/dL — ABNORMAL LOW (ref 70–99)
Glucose-Capillary: 102 mg/dL — ABNORMAL HIGH (ref 70–99)
Glucose-Capillary: 80 mg/dL (ref 70–99)
Glucose-Capillary: 61 mg/dL — ABNORMAL LOW (ref 70–99)
Glucose-Capillary: 83 mg/dL (ref 70–99)

## 2013-01-03 NOTE — Progress Notes (Signed)
Goals  Group Note  Date:  01/03/2013 Time:  0915 Group Topic/Focus:  Goals Group : This group is centered on helping patients identify goals they want to work towards and it helps them idnentify steps they need to take to achieve these goals.  Participation Level:  Did Not Attend  Participation Quality:  Inattentive  Affect:  N/A  Cognitive:  N/A  Insight:  NONE  Engagement in Group:  NONE  Additional Comments:   Rich Brave 1:28 PM. 01/03/2013

## 2013-01-03 NOTE — Progress Notes (Signed)
[facility] MD Progress Note  01/03/2013 1:10 PM Cody Hale  MRN:  [id number] Subjective:  Vomiting has subsided, drinking gingerale and tolerating, medications for hypertension and diabetes held due to low BP and glucose levels, 1/10 depression and no suicidal ideations, 5/10 anxiety  Diagnosis:   Axis I: Major Depression, Recurrent severe Axis II: Deferred Axis III:  Past Medical History  Diagnosis Date  . Diabetes mellitus, type II     Gastroparesis; GI care at [facility]  . Left leg cellulitis   . History of tobacco abuse   . Hypertension   . Degenerative joint disease   . Morbid obesity 06/27/2012  . 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 s/p zyvox rx.  (Cultures never positive); left bundle branch block; H/o SVT; 09/2012: bioprosthetic MVR at [facility]; a. Severe dental caries and cavities s/p multiple extractions.  . Depression   . Urinary tract infection 08/30/2012    Proteus mirabilis  . Chronic venous insufficiency   . Nephrolithiasis   . 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 Systolic heart failure 11/2012   Axis IV: other psychosocial or environmental problems, problems related to social environment and problems with primary support group Axis V: 41-50 serious symptoms  ADL's:  Intact  Sleep: Good  Appetite:  Poor  Suicidal Ideation:  Denies Homicidal Ideation:  Denies  Psychiatric Specialty Exam: Review of Systems  Constitutional: Negative.   HENT: Negative.   Eyes: Negative.   Respiratory: Negative.   Cardiovascular: Negative.   Gastrointestinal: Positive for nausea.  Genitourinary: Negative.   Musculoskeletal: Negative.   Skin: Negative.   Neurological: Negative.   Endo/Heme/Allergies: Negative.   Psychiatric/Behavioral: Positive for depression. The patient is nervous/anxious.     Blood pressure 90/49, pulse 78, temperature 98.4 F (36.9 C), temperature source Oral, resp. rate 16, height 5\' 9"  (1.753 m), weight 116.574 kg (257 lb), SpO2  97.00%.Body mass index is 37.93 kg/(m^2).  General Appearance: Casual  Eye Contact::  Fair  Speech:  Normal Rate  Volume:  Normal  Mood:  Anxious and Depressed  Affect:  Congruent  Thought Process:  Coherent  Orientation:  Full (Time, Place, and Person)  Thought Content:  WDL  Suicidal Thoughts:  No  Homicidal Thoughts:  No  Memory:  Immediate;   Fair Recent;   Fair Remote;   Fair  Judgement:  Fair  Insight:  Fair  Psychomotor Activity:  Decreased  Concentration:  Fair  Recall:  Fair  Akathisia:  No  Handed:  Right  AIMS (if indicated):     Assets:  Resilience  Sleep:  Number of Hours: 6   Current Medications: Current Facility-Administered Medications  Medication Dose Route Frequency Provider Last Rate Last Dose  . acetaminophen (TYLENOL) tablet 650 mg  650 mg Oral Q6H PRN Earney Navy, NP   650 mg at 01/01/13 1000  . alum & mag hydroxide-simeth (MAALOX/MYLANTA) 200-200-20 MG/5ML suspension 30 mL  30 mL Oral Q4H PRN Earney Navy, NP      . buPROPion (WELLBUTRIN XL) 24 hr tablet 150 mg  150 mg Oral Daily Fransisca Kaufmann, NP   150 mg at 01/02/13 1839  . carvedilol (COREG) tablet 12.5 mg  12.5 mg Oral BID WC Verne Spurr, PA-C   12.5 mg at 01/02/13 1832  . citalopram (CELEXA) tablet 20 mg  20 mg Oral Daily Fransisca Kaufmann, NP      . furosemide (LASIX) tablet 40 mg  40 mg Oral Daily Verne Spurr, PA-C   40  mg at 01/02/13 0827  . glipiZIDE (GLUCOTROL) tablet 10 mg  10 mg Oral BID AC Verne Spurr, PA-C   10 mg at 01/03/13 0659  . guaiFENesin (MUCINEX) 12 hr tablet 600 mg  600 mg Oral BID Kerry Hough, PA-C   600 mg at 01/02/13 1832  . hydrOXYzine (ATARAX/VISTARIL) tablet 50 mg  50 mg Oral Q6H PRN Nehemiah Settle, MD   50 mg at 12/31/12 1556  . insulin aspart (novoLOG) injection 0-9 Units  0-9 Units Subcutaneous TID WC Sanjuana Kava, NP   3 Units at 01/02/13 1844  . insulin aspart (novoLOG) injection 3 Units  3 Units Subcutaneous TID WC Sanjuana Kava, NP   3 Units  at 01/02/13 [phone]  . lisinopril (PRINIVIL,ZESTRIL) tablet 40 mg  40 mg Oral Daily Verne Spurr, PA-C   40 mg at 01/02/13 9604  . magnesium hydroxide (MILK OF MAGNESIA) suspension 30 mL  30 mL Oral Daily PRN Earney Navy, NP   30 mL at 12/27/12 2153  . metoCLOPramide (REGLAN) tablet 10 mg  10 mg Oral TID AC & HS Nanine Means, NP   10 mg at 01/03/13 0659  . multivitamin with minerals tablet 1 tablet  1 tablet Oral Daily Verne Spurr, PA-C   1 tablet at 01/02/13 5409  . nitroGLYCERIN (NITROSTAT) SL tablet 0.4 mg  0.4 mg Sublingual Q5 min PRN Verne Spurr, PA-C      . omega-3 acid ethyl esters (LOVAZA) capsule 1 g  1 g Oral Daily Verne Spurr, PA-C   1 g at 01/02/13 0827  . ondansetron (ZOFRAN-ODT) disintegrating tablet 4 mg  4 mg Oral Q8H PRN Fransisca Kaufmann, NP   4 mg at 01/02/13 1317  . promethazine (PHENERGAN) injection 25 mg  25 mg Intramuscular Q6H PRN Nanine Means, NP   25 mg at 01/02/13 1522  . spironolactone (ALDACTONE) tablet 25 mg  25 mg Oral Daily Verne Spurr, PA-C   25 mg at 01/02/13 8119  . traZODone (DESYREL) tablet 150 mg  150 mg Oral QHS,MR X 1 Nehemiah Settle, MD   150 mg at 01/02/13 2214    Lab Results:  Results for orders placed during the hospital encounter of 12/26/12 (from the past 48 hour(s))  GLUCOSE, CAPILLARY     Status: Abnormal   Collection Time    01/01/13  4:56 PM      Result Value Range   Glucose-Capillary 101 (*) 70 - 99 mg/dL  GLUCOSE, CAPILLARY     Status: Abnormal   Collection Time    01/01/13  8:42 PM      Result Value Range   Glucose-Capillary 156 (*) 70 - 99 mg/dL  GLUCOSE, CAPILLARY     Status: Abnormal   Collection Time    01/02/13  6:13 AM      Result Value Range   Glucose-Capillary 122 (*) 70 - 99 mg/dL  GLUCOSE, CAPILLARY     Status: Abnormal   Collection Time    01/02/13 11:46 AM      Result Value Range   Glucose-Capillary 162 (*) 70 - 99 mg/dL  GLUCOSE, CAPILLARY     Status: Abnormal   Collection Time    01/02/13  5:19 PM       Result Value Range   Glucose-Capillary 211 (*) 70 - 99 mg/dL   Comment 1 Documented in Chart     Comment 2 Notify RN    GLUCOSE, CAPILLARY     Status: Abnormal   Collection  Time    01/02/13  8:58 PM      Result Value Range   Glucose-Capillary 192 (*) 70 - 99 mg/dL  GLUCOSE, CAPILLARY     Status: Abnormal   Collection Time    01/03/13  6:15 AM      Result Value Range   Glucose-Capillary 110 (*) 70 - 99 mg/dL  GLUCOSE, CAPILLARY     Status: Abnormal   Collection Time    01/03/13 11:23 AM      Result Value Range   Glucose-Capillary 62 (*) 70 - 99 mg/dL   Comment 1 Documented in Chart     Comment 2 Notify RN    GLUCOSE, CAPILLARY     Status: Abnormal   Collection Time    01/03/13 12:22 PM      Result Value Range   Glucose-Capillary 102 (*) 70 - 99 mg/dL   Comment 1 Documented in Chart     Comment 2 Notify RN      Physical Findings: AIMS: Facial and Oral Movements Muscles of Facial Expression: None, normal Lips and Perioral Area: None, normal Jaw: None, normal Tongue: None, normal,Extremity Movements Upper (arms, wrists, hands, fingers): None, normal Lower (legs, knees, ankles, toes): None, normal, Trunk Movements Neck, shoulders, hips: None, normal, Overall Severity Severity of abnormal movements (highest score from questions above): None, normal Incapacitation due to abnormal movements: None, normal Patient's awareness of abnormal movements (rate only patient's report): No Awareness, Dental Status Current problems with teeth and/or dentures?: No Does patient usually wear dentures?: No  CIWA:  CIWA-Ar Total: 1 COWS:  COWS Total Score: 0  Treatment Plan Summary: Daily contact with patient to assess and evaluate symptoms and progress in treatment Medication management  Plan:  Review of chart, vital signs, medications, and notes. 1-Individual and group therapy 2-Medication management for depression and anxiety:  Medications reviewed with the patient and no untoward  effects, no changes made 3-Coping skills for depression and anxiety 4-Continue crisis stabilization and management 5-Address health issues--monitoring vital signs, stable 6-Treatment plan in progress to prevent relapse of depression and anxiety  Medical Decision Making Problem Points:  Established problem, stable/improving (1) and Review of psycho-social stressors (1) Data Points:  Review of medication regiment & side effects (2)  I certify that inpatient services furnished can reasonably be expected to improve the patient's condition.   Nanine Means, PMH-NP 01/03/2013, 1:10 PM

## 2013-01-03 NOTE — Progress Notes (Signed)
Patient ID: Cody Hale, male   DOB: 04-02-1951, 62 y.o.   MRN: [id number]  D: Pt has been in the bed most of the day, he reported that he had been sick since yesterday. Pt blood pressure was low this morning and at lunch his CBG was 62. Pt refused glucose gel, he just requested a cup of juice. Pt was given a cup of grape juice, and after 15 mins his blood sugar was 102. Pt reported that he was depressed, but he was feeling better than he was yesterday. Pt reported being negative SI/HI, no AH/VH noted. Jamison NP made aware of situation, she gave verbal order to hold morning and lunch medication. No meds were given.  A: Pt 15 min checks continued for patient safety. R: Pt safety maintained.

## 2013-01-03 NOTE — [facility] Group Notes (Signed)
[facility] Group Notes:  (Clinical Social Work)  01/03/2013   3:00-4:00PM  Summary of Progress/Problems:   The main focus of today's process group was to   identify the patient's current support system and decide on other supports that can be put in place.  Four definitions/levels of support were discussed.  An emphasis was placed on using counselor, doctor, therapy groups, 12-step groups, and problem-specific support groups to expand supports.  The group also discussed the stigma that goes along with mental illness and how to be self-protective while asking for additional supports from friends/church members/family.  The patient verbalized understanding of the importance of increasing support to prevent relapse.   He stated his brothers and sisters already surround him with support, and he has grandkids and greatgrandchildren he wants to see that provide support just through innocent love.  He also is willing to add a therapist and doctor.  Type of Therapy:  Process Group  Participation Level:  Active  Participation Quality:  Attentive  Affect:  Blunted  Cognitive:  Appropriate  Insight:  Developing/Improving  Engagement in Therapy:  Engaged  Modes of Intervention:  Education,  Support and [employer], LCSW 01/03/2013, 4:50 PM

## 2013-01-03 NOTE — Progress Notes (Signed)
Adult Psychoeducational Group Note  Date:  01/03/2013 Time:  6:54 PM  Group Topic/Focus:  Emotional Education:   The focus of this group is to discuss what feelings/emotions are, and how they are experienced.  Participation Level:  Active  Participation Quality:  Appropriate, Sharing and Supportive  Affect:  Appropriate  Cognitive:  Alert and Appropriate  Insight: Appropriate  Engagement in Group:  Engaged and Supportive  Modes of Intervention:  Activity, Discussion, Socialization and Support  Additional Comments:  Pt actively participated in group. Pt stated that his biggest fear is not being able to be there for his family and see his children and grandchildren. Pt stated he is glad that he is getting healthy and feels ready to go home.  Caswell Corwin 01/03/2013, 6:54 PM

## 2013-01-03 NOTE — Progress Notes (Signed)
D: Patient in the dayroom on approach.  Patient states states he is feeling much better,.  Patient did not go outside today.  Patient states he is ready to get his life back on track.  Patient state she is family is in the process of making new living arrangements for him.  Patient blood sugan at bedtime was 61.  Patient wanted to drink ginger ale and patient ate a snack.  Patient blood sugar rechecked and it came up to 89. Patient denies SI/HI and denies AVH.  Patient states he is going to spend more time with his grandchildren now.  A: Staff to monitor Q 15 mins for safety.  Encouragement and support offered.  Scheduled medications administered per orders. R: Patient remains safe on the unit.  Patient attended group tonight.  Patient cooperative and taking administered medications.  Patient visible on the unit but not interacting with peers.  Patient appears flat and depressed.

## 2013-01-03 NOTE — Progress Notes (Signed)
Psychoeducational Group Note  Date:  01/03/2013 Time: 1015 Group Topic/Focus:  Making Healthy Choices:   The focus of this group is to help patients identify negative/unhealthy choices they were using prior to admission and identify positive/healthier coping strategies to replace them upon discharge.  Participation Level:  Did Not Attend  Participation Quality:  N/A N/A   Affect:  N/A  Cognitive: N/A   Insight:  N/A  Engagement in Group:    Additional Comments:    Rich Brave 1:39 PM. 01/03/2013

## 2013-01-04 ENCOUNTER — Ambulatory Visit (HOSPITAL_COMMUNITY): Payer: Self-pay | Admitting: Psychiatry

## 2013-01-04 ENCOUNTER — Encounter (HOSPITAL_COMMUNITY): Payer: BC Managed Care – PPO

## 2013-01-04 LAB — GLUCOSE, CAPILLARY
Glucose-Capillary: 88 mg/dL (ref 70–99)
Glucose-Capillary: 138 mg/dL — ABNORMAL HIGH (ref 70–99)
Glucose-Capillary: 193 mg/dL — ABNORMAL HIGH (ref 70–99)
Glucose-Capillary: 131 mg/dL — ABNORMAL HIGH (ref 70–99)

## 2013-01-04 NOTE — Tx Team (Signed)
Interdisciplinary Treatment Plan Update   Date Reviewed:  01/04/2013  Time Reviewed:  9:55 AM  Progress in Treatment:   Attending groups: Yes Participating in groups: Yes Taking medication as prescribed: Yes  Tolerating medication: Yes Family/Significant other contact made: Yes, contact made with wife and sister Patient understands diagnosis: Yes  Discussing patient identified problems/goals with staff: Yes Medical problems stabilized or resolved: Yes Denies suicidal/homicidal ideation:  No, but contracts for safety Patient has not harmed self or others: Yes  For review of initial/current patient goals, please see plan of care.  Estimated Length of Stay:  One day  Reasons for Continued Hospitalization:  Anxiety Depression Medication stabilization  New Problems/Goals identified:    Discharge Plan or Barriers:   Home with outpatient follow up [facility]  Additional Comments:    Patient reports having nausea and vomiting.  He hopes to discharge tomorrow. Attendees:  Patient:  01/04/2013 9:55 AM   Signature: Mervyn Gay, MD 01/04/2013 9:55 AM  Signature: 01/04/2013 9:55 AM  Signature: Harold Barban, RN 01/04/2013 9:55 AM  Signature: Nestor Ramp, RN 01/04/2013 9:55 AM  Signature:  01/04/2013 9:55 AM  Signature:  Juline Patch, LCSW 01/04/2013 9:55 AM  Signature:  Reyes Ivan, LCSW 01/04/2013 9:55 AM  Signature:  Sharin Grave Coordinator 01/04/2013 9:55 AM  Signature: Fransisca Kaufmann, [facility] 01/04/2013 9:55 AM  Signature:    Signature:    Signature:      Scribe for Treatment Team:   Juline Patch,  01/04/2013 9:55 AM

## 2013-01-04 NOTE — Progress Notes (Signed)
Adult Psychoeducational Group Note  Date:  01/04/2013 Time:  8:00PM  Group Topic/Focus:  Wrap-Up Group:   The focus of this group is to help patients review their daily goal of treatment and discuss progress on daily workbooks.  Participation Level:  Did Not Attend   Additional Comments: Pt. Didn't attend group.   [name] 01/04/2013, 9:38 PM

## 2013-01-04 NOTE — Progress Notes (Signed)
Pt observed lying in his bed awake.  He was having nausea with some vomiting earlier in the day, but pt reports that has resolved, although he is still feeling somewhat weak as a result.  Pt reports he may discharge tomorrow to stay with his sister.  He says he is feeling good with this plan.  He denies SI/HI/AV.  He voices no needs/concerns at this time.  Pt's night meds were discussed with him.  Support and encouragement offered.  Safety maintained with q15 minute checks.

## 2013-01-04 NOTE — Progress Notes (Signed)
Adult Psychoeducational Group Note  Date:  01/03/2013 Time:  800 pm  Group Topic/Focus:  Wrap-Up Group:   The focus of this group is to help patients review their daily goal of treatment and discuss progress on daily workbooks.  Participation Level:  Active  Participation Quality:  Appropriate  Affect:  Appropriate  Cognitive:  Appropriate  Insight: Appropriate  Engagement in Group:  Engaged  Modes of Intervention:  Discussion  Additional Comments:  Pt reported that he had a very good day. Pt expressed that he was feeling much better and was able to get out of his room and interact with others more today.  Pt reported that his goal for the following day is have a "10" type of day.  Pt expressed that he is ready to be discharged and feels his planes that are in place are solid as well as the supports he has that include his family.  Marvis Moeller A 01/04/2013, 12:05 AM

## 2013-01-04 NOTE — [facility] Group Notes (Signed)
[facility] LCSW Group Therapy        Overcoming Obstacles 1:15 2:30 PM         01/04/2013 3:03 PM  Type of Therapy:  Group Therapy  Participation Level:  Did Not Attend   Wynn Banker 01/04/2013, 3:03 PM

## 2013-01-04 NOTE — Progress Notes (Signed)
Patient ID: Cody Hale, male   DOB: 1950/11/15, 62 y.o.   MRN: [id number] [facility] MD Progress Note  01/04/2013 12:01 PM Cody Hale  MRN:  [id number] Subjective:  Patient reports some increase in depression related to physical symptoms. He began to have episodes of vomiting this morning. Patient feels better since being started back on reglan. He does not feel ready for discharge today but anticipates going tomorrow. The patient present in hospital scrubs and looks to be feeling poorly. Has experienced som passive SI so far today. He is able to contract for his safety on the unit.   Diagnosis:   Axis I: Major Depression, Recurrent severe Axis II: Deferred Axis III:  Past Medical History  Diagnosis Date  . Diabetes mellitus, type II     Gastroparesis; GI care at [facility]  . Left leg cellulitis   . History of tobacco abuse   . Hypertension   . Degenerative joint disease   . Morbid obesity 06/27/2012  . 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 s/p zyvox rx.  (Cultures never positive); left bundle branch block; H/o SVT; 09/2012: bioprosthetic MVR at [facility]; a. Severe dental caries and cavities s/p multiple extractions.  . Depression   . Urinary tract infection 08/30/2012    Proteus mirabilis  . Chronic venous insufficiency   . Nephrolithiasis   . 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 Systolic heart failure 11/2012   Axis IV: other psychosocial or environmental problems, problems related to social environment and problems with primary support group Axis V: 41-50 serious symptoms  ADL's:  Intact  Sleep: Good  Appetite:  Poor  Suicidal Ideation:  Passive SI with plan-decreasing in severity.  Homicidal Ideation:  Denies  Psychiatric Specialty Exam: Review of Systems  Constitutional: Negative.   HENT: Negative.   Eyes: Negative.   Respiratory: Negative.   Cardiovascular: Negative.   Gastrointestinal: Positive for nausea and vomiting.  Genitourinary: Negative.    Musculoskeletal: Negative.   Skin: Negative.   Neurological: Negative.   Endo/Heme/Allergies: Negative.   Psychiatric/Behavioral: Positive for depression and suicidal ideas. Negative for hallucinations, memory loss and substance abuse. The patient is nervous/anxious. The patient does not have insomnia.     Blood pressure 125/70, pulse 88, temperature 97.7 F (36.5 C), temperature source Oral, resp. rate 18, height 5\' 9"  (1.753 m), weight 116.574 kg (257 lb), SpO2 97.00%.Body mass index is 37.93 kg/(m^2).  General Appearance: Casual  Eye Contact::  Fair  Speech:  Normal Rate  Volume:  Normal  Mood:  Anxious and Depressed  Affect:  Congruent  Thought Process:  Coherent  Orientation:  Full (Time, Place, and Person)  Thought Content:  WDL  Suicidal Thoughts:  No  Homicidal Thoughts:  No  Memory:  Immediate;   Fair Recent;   Fair Remote;   Fair  Judgement:  Fair  Insight:  Fair  Psychomotor Activity:  Decreased  Concentration:  Fair  Recall:  Fair  Akathisia:  No  Handed:  Right  AIMS (if indicated):     Assets:  Resilience  Sleep:  Number of Hours: 6.75   Current Medications: Current Facility-Administered Medications  Medication Dose Route Frequency Provider Last Rate Last Dose  . acetaminophen (TYLENOL) tablet 650 mg  650 mg Oral Q6H PRN Earney Navy, NP   650 mg at 01/01/13 1000  . alum & mag hydroxide-simeth (MAALOX/MYLANTA) 200-200-20 MG/5ML suspension 30 mL  30 mL Oral Q4H PRN Earney Navy, NP      .  buPROPion (WELLBUTRIN XL) 24 hr tablet 150 mg  150 mg Oral Daily Fransisca Kaufmann, NP   150 mg at 01/04/13 0746  . carvedilol (COREG) tablet 12.5 mg  12.5 mg Oral BID WC Verne Spurr, PA-C   12.5 mg at 01/04/13 0747  . citalopram (CELEXA) tablet 20 mg  20 mg Oral Daily Fransisca Kaufmann, NP   20 mg at 01/04/13 0747  . furosemide (LASIX) tablet 40 mg  40 mg Oral Daily Verne Spurr, PA-C   40 mg at 01/04/13 0747  . glipiZIDE (GLUCOTROL) tablet 10 mg  10 mg Oral BID AC Verne Spurr, PA-C   10 mg at 01/04/13 5284  . guaiFENesin (MUCINEX) 12 hr tablet 600 mg  600 mg Oral BID Kerry Hough, PA-C   600 mg at 01/04/13 0745  . hydrOXYzine (ATARAX/VISTARIL) tablet 50 mg  50 mg Oral Q6H PRN Nehemiah Settle, MD   50 mg at 12/31/12 1556  . insulin aspart (novoLOG) injection 0-9 Units  0-9 Units Subcutaneous TID WC Sanjuana Kava, NP   3 Units at 01/02/13 1844  . insulin aspart (novoLOG) injection 3 Units  3 Units Subcutaneous TID WC Sanjuana Kava, NP   3 Units at 01/02/13 [phone]  . lisinopril (PRINIVIL,ZESTRIL) tablet 40 mg  40 mg Oral Daily Verne Spurr, PA-C   40 mg at 01/04/13 0746  . magnesium hydroxide (MILK OF MAGNESIA) suspension 30 mL  30 mL Oral Daily PRN Earney Navy, NP   30 mL at 12/27/12 2153  . metoCLOPramide (REGLAN) tablet 10 mg  10 mg Oral TID AC & HS Nanine Means, NP   10 mg at 01/04/13 1151  . multivitamin with minerals tablet 1 tablet  1 tablet Oral Daily Verne Spurr, PA-C   1 tablet at 01/04/13 0746  . nitroGLYCERIN (NITROSTAT) SL tablet 0.4 mg  0.4 mg Sublingual Q5 min PRN Verne Spurr, PA-C      . omega-3 acid ethyl esters (LOVAZA) capsule 1 g  1 g Oral Daily Verne Spurr, PA-C   1 g at 01/04/13 0746  . ondansetron (ZOFRAN-ODT) disintegrating tablet 4 mg  4 mg Oral Q8H PRN Fransisca Kaufmann, NP   4 mg at 01/02/13 1317  . promethazine (PHENERGAN) injection 25 mg  25 mg Intramuscular Q6H PRN Nanine Means, NP   25 mg at 01/02/13 1522  . spironolactone (ALDACTONE) tablet 25 mg  25 mg Oral Daily Verne Spurr, PA-C   25 mg at 01/04/13 0748  . traZODone (DESYREL) tablet 150 mg  150 mg Oral QHS,MR X 1 Nehemiah Settle, MD   150 mg at 01/02/13 2214    Lab Results:  Results for orders placed during the hospital encounter of 12/26/12 (from the past 48 hour(s))  GLUCOSE, CAPILLARY     Status: Abnormal   Collection Time    01/02/13  5:19 PM      Result Value Range   Glucose-Capillary 211 (*) 70 - 99 mg/dL   Comment 1 Documented in Chart      Comment 2 Notify RN    GLUCOSE, CAPILLARY     Status: Abnormal   Collection Time    01/02/13  8:58 PM      Result Value Range   Glucose-Capillary 192 (*) 70 - 99 mg/dL  GLUCOSE, CAPILLARY     Status: Abnormal   Collection Time    01/03/13  6:15 AM      Result Value Range   Glucose-Capillary 110 (*) 70 - 99  mg/dL  GLUCOSE, CAPILLARY     Status: Abnormal   Collection Time    01/03/13 11:23 AM      Result Value Range   Glucose-Capillary 62 (*) 70 - 99 mg/dL   Comment 1 Documented in Chart     Comment 2 Notify RN    GLUCOSE, CAPILLARY     Status: Abnormal   Collection Time    01/03/13 12:22 PM      Result Value Range   Glucose-Capillary 102 (*) 70 - 99 mg/dL   Comment 1 Documented in Chart     Comment 2 Notify RN    GLUCOSE, CAPILLARY     Status: None   Collection Time    01/03/13  5:31 PM      Result Value Range   Glucose-Capillary 80  70 - 99 mg/dL  GLUCOSE, CAPILLARY     Status: Abnormal   Collection Time    01/03/13  9:19 PM      Result Value Range   Glucose-Capillary 61 (*) 70 - 99 mg/dL  GLUCOSE, CAPILLARY     Status: None   Collection Time    01/03/13  9:49 PM      Result Value Range   Glucose-Capillary 83  70 - 99 mg/dL  GLUCOSE, CAPILLARY     Status: None   Collection Time    01/04/13  6:16 AM      Result Value Range   Glucose-Capillary 88  70 - 99 mg/dL  GLUCOSE, CAPILLARY     Status: Abnormal   Collection Time    01/04/13 11:45 AM      Result Value Range   Glucose-Capillary 138 (*) 70 - 99 mg/dL    Physical Findings: AIMS: Facial and Oral Movements Muscles of Facial Expression: None, normal Lips and Perioral Area: None, normal Jaw: None, normal Tongue: None, normal,Extremity Movements Upper (arms, wrists, hands, fingers): None, normal Lower (legs, knees, ankles, toes): None, normal, Trunk Movements Neck, shoulders, hips: None, normal, Overall Severity Severity of abnormal movements (highest score from questions above): None,  normal Incapacitation due to abnormal movements: None, normal Patient's awareness of abnormal movements (rate only patient's report): No Awareness, Dental Status Current problems with teeth and/or dentures?: No Does patient usually wear dentures?: No  CIWA:  CIWA-Ar Total: 1 COWS:  COWS Total Score: 0  Treatment Plan Summary: Daily contact with patient to assess and evaluate symptoms and progress in treatment Medication management  Plan:  Review of chart, vital signs, medications, and notes. 1-Individual and group therapy 2-Medication management for depression and anxiety:  Medications reviewed with the patient and no untoward effects, no changes made 3-Coping skills for depression and anxiety 4-Continue crisis stabilization and management 5-Address health issues--monitoring vital signs, stable 6-Treatment plan in progress to prevent relapse of depression and anxiety. Anticipate discharge tomorrow.   Medical Decision Making Problem Points:  Established problem, stable/improving (1) and Review of psycho-social stressors (1) Data Points:  Review of medication regiment & side effects (2)  I certify that inpatient services furnished can reasonably be expected to improve the patient's condition.   Fransisca Kaufmann, NP-C 01/04/2013, 12:01 PM

## 2013-01-04 NOTE — Progress Notes (Signed)
Recreation Therapy Notes  Date: 06.30.2014 Time: 3:00pm Location: [address] Day[address]      Group Topic/Focus: Coping Skills  Participation Level: Did not attend  [employer], LRT/CTRS  Jearl Klinefelter 01/04/2013 5:27 PM

## 2013-01-04 NOTE — BHH Group Notes (Signed)
[facility] LCSW Aftercare Discharge Planning Group Note   01/04/2013 11:33 AM  Participation Quality:  Approprite  Mood/Affect: Appropriate  Depression Rating:  0  Anxiety Rating:  0  Thoughts of Suicide: No  Will you contract for safety?  N/A  Current AVH:  No  Plan for Discharge/Comments:  Patient reports doing much bette today.  He is rating all symptoms at zero and reports being ready to discharge home.  Transportation Means:   Patient has transportation.   Supports:  Patient has a good support system.   Corazon Nickolas, Joesph July

## 2013-01-04 NOTE — Progress Notes (Signed)
Adult Psychoeducational Group Note  Date:  01/04/2013 Time:  11:00 AM  Group Topic/Focus:  Self Care:   The focus of this group is to help patients understand the importance of self-care in order to improve or restore emotional, physical, spiritual, interpersonal, and financial health.  Participation Level:  Did Not Attend  Participation Quality:    Affect:    Cognitive:    Insight:   Engagement in Group:  None  Modes of Intervention:    Additional Comments:  Pt did not attend this group session.  Malachy Moan 01/04/2013, 2:59 PM

## 2013-01-04 NOTE — Progress Notes (Signed)
Patient ID: Cody Hale, male   DOB: [birth year], 62 y.o.   MRN: [id number] D- patient says he slept well last night.  His appetite has been good, but after breakfast patient felt nauseated and vomited.  He did not eat this am, but did drink diet gingerale.  He felt nauseated and vomited again before lunch.  Pt given zofran and said he felt better but did not want any lunch.  Patient is not running a temp (97.8)  He is resting in bed.

## 2013-01-05 LAB — GLUCOSE, CAPILLARY
Glucose-Capillary: 70 mg/dL (ref 70–99)
Glucose-Capillary: 44 mg/dL — CL (ref 70–99)
Glucose-Capillary: 53 mg/dL — ABNORMAL LOW (ref 70–99)
Glucose-Capillary: 90 mg/dL (ref 70–99)

## 2013-01-05 MED ORDER — METOCLOPRAMIDE HCL 10 MG PO TABS: 10.0000 mg | ORAL_TABLET | Freq: Three times a day (TID) | ORAL | Status: DC

## 2013-01-05 MED ORDER — GLIPIZIDE 10 MG PO TABS: 10.0000 mg | ORAL_TABLET | Freq: Two times a day (BID) | ORAL | Status: DC

## 2013-01-05 MED ORDER — NITROGLYCERIN 0.4 MG SL SUBL: 0.4000 mg | SUBLINGUAL_TABLET | SUBLINGUAL | Status: DC | PRN

## 2013-01-05 MED ORDER — SPIRONOLACTONE 25 MG PO TABS: 25.0000 mg | ORAL_TABLET | Freq: Every day | ORAL | Status: DC

## 2013-01-05 MED ORDER — TRAZODONE HCL 150 MG PO TABS: 150.0000 mg | ORAL_TABLET | Freq: Every evening | ORAL | Status: DC | PRN

## 2013-01-05 MED ORDER — LISINOPRIL 40 MG PO TABS: 40.0000 mg | ORAL_TABLET | Freq: Every day | ORAL | Status: DC

## 2013-01-05 MED ORDER — CITALOPRAM HYDROBROMIDE 20 MG PO TABS: 20.0000 mg | ORAL_TABLET | Freq: Every day | ORAL | Status: DC

## 2013-01-05 MED ORDER — CARVEDILOL 12.5 MG PO TABS: 12.5000 mg | ORAL_TABLET | Freq: Two times a day (BID) | ORAL | Status: DC

## 2013-01-05 MED ORDER — BUPROPION HCL ER (XL) 150 MG PO TB24: 150.0000 mg | ORAL_TABLET | Freq: Every day | ORAL | Status: DC

## 2013-01-05 MED ORDER — FUROSEMIDE 40 MG PO TABS: 40.0000 mg | ORAL_TABLET | Freq: Every day | ORAL | Status: DC

## 2013-01-05 NOTE — Progress Notes (Signed)
Patient ID: Cody Hale, male   DOB: 1950/08/29, 62 y.o.   MRN: [id number] D- Patient is up and attending groups.  This am his PB and pulse were low.  Medications for HTN held and NP informed.  P rechecked mid-morning and at noon and it continues to be low as documented.  Patient's CBG was also low at 53. Pt given snack and it was still low 44 13 minutes later.Patient then given lunch.  NP informed.  Patient denies feeling weak or symptomatic.  Isulin held at noon.  Patient is looking forward to discharge.  Plan is to recheck vitals prior to [facility] and have patient followup with medical doctor.

## 2013-01-05 NOTE — Plan of Care (Signed)
Problem: Ineffective individual coping Goal: STG: Patient will participate in after care plan Patient is attending groups and easily engaged in discussions. Outpatient services to be scheduled. Drema Eddington, LCSW 12/28/2012  Outcome: Completed/Met Date Met:  01/05/13 Patient has attended groups and easily engaged in discussions.  Outpatient follow up is scheduled. Shaniece Bussa, LCSW 01/05/2013

## 2013-01-05 NOTE — Progress Notes (Signed)
[facility] Adult Case Management Discharge Plan :  Will you be returning to the same living situation after discharge: No, patient to discharge to his sister's home. At discharge, do you have transportation home?:Yes,  Patient family will transport him home. Do you have the ability to pay for your medications:Yes,  Patient is able to afford medications.  Release of information consent forms completed and in the chart;  Patient's signature needed at discharge.  Patient to Follow up at: Follow-up Information   Follow up with Florencia Reasons - [facility] Today. (You scheduled with Florencia Reasons on Wednesday, January 06, 2013 at 3:00 PM.  Please aririve at 2:45 to complete registrations)    Contact information:   [address] [place],, [place]   [address]  [phone]      Follow up with Dr. Lolly Mustache - [facility] On 01/26/2013. (You are scheduled to see Dr. Lolly Mustache on Tuesday, January 26, 2013 at3 PM)    Contact information:   [address] [place], [place]   [address]  [phone]      Patient denies SI/HI:  Patient no longer endorsing SI/HI or other thoughts of self harm.  Safety Planning and Suicide Prevention discussed: .Reviewed with all patients during discharge planning group  Cody Hale 01/05/2013, 11:13 AM

## 2013-01-05 NOTE — Progress Notes (Signed)
Patient ID: Cody Hale, male   DOB: 1951-01-22, 62 y.o.   MRN: [id number] Patient is discharged ambulatory to ride home with and live with his sister.  He denies SI/HI.  He verbalizes understanding of his d/c meds and followup. He was given back his belongings from locker and was given scripts and a two day supply of meds from MD.  He knows to followup with his medical MD as BP meds were held today due to low BP.  Pt plans to go to court tomorrow and might see his wife.   Says he will have supporivet people with him.
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
## 2013-01-05 NOTE — Discharge Summary (Signed)
Physician Discharge Summary Note  Patient:  Cody Hale is an 62 y.o., male MRN:  [id number] DOB:  1950-08-02 Patient phone:  [phone] (home)  Patient address:   [address] [place] [address]   Date of Admission:  12/26/2012 Date of Discharge: 01/05/13  Discharge Diagnoses: Principal Problem:   Major depressive disorder, recurrent episode, moderate  Axis Diagnosis:  AXIS I: Major Depression, Recurrent severe  AXIS II: Deferred  AXIS III:  Past Medical History   Diagnosis  Date   .  Diabetes mellitus, type II      Gastroparesis; GI care at [facility]   .  Left leg cellulitis    .  History of tobacco abuse    .  Hypertension    .  Degenerative joint disease    .  Morbid obesity  06/27/2012   .  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 s/p zyvox rx. (Cultures never positive); left bundle branch block; H/o SVT; 09/2012: bioprosthetic MVR at [facility]; a. Severe dental caries and cavities s/p multiple extractions.   .  Depression    .  Urinary tract infection  08/30/2012     Proteus mirabilis   .  Chronic venous insufficiency    .  Nephrolithiasis    .  History of prosthetic mitral valve  09/2012   .  Systolic heart failure  11/2012    AXIS IV: economic problems, occupational problems, other psychosocial or environmental problems, problems related to social environment and problems with primary support group  AXIS V: 51-60 moderate symptoms  Level of Care:  OP  Hospital Course:   This is a 62 year old Caucasian male. Admitted to [facility] from the [facility] with complaints of increased depression and suicidal thoughts with plans to drown in a lake/river. Patient reports, "[employer] took me to [facility] on Thursday. I believe my wife called him. She claimed that she got worried that I might kill myself because of what she over heard me say. I don't believe that she was scared that I might do something stupid to myself because she is the cause of  why I feel like I don't want to live no more. She keep hollering at me about stuff. She talks to me like I am a dog. She is constantly saying some rude stuff about my mama. I could not take it any more. So, last Thursday, she started her smart talk towards me again. I hollered back at her and said, may be you are better of without me. I had actually thought about going to the lake near the house, jump in there and drown. I don't know how to swim, that means, once I jump into the lake, I can't swim out and that will take care of it. I have a lot of health problems. I recently had a heart valve replacement. I'm constantly going to the doctors' appointments. I'm sick of it. My doctor had started me on depression medicine 2 weeks ago. He told me that in 2 weeks, that I will feel better. It has been 2 weeks, I'm still not feeling any better. Right now, I'm feeling real low in my life. My nerves are all torn up".   While a patient in this hospital, Cody Hale was enrolled in group counseling and activities as well as received the following medication Wellbutrin XL 150 mg daily, Celexa 20 mg daily, and Trazodone 150 mg hs with  repeat dose for sleep. His medical medications for his high blood pressure and diabetes were also continued. Patient's blood sugar values were monitored and insulin was administered accordingly from nursing staff. The patient reported several episodes of vomiting related to his problems with gastroparesis with resulted in Reglan being ordered for him four times daily. Due to this his blood pressures and blood sugars decreased and on the day of discharge his blood pressure medicines were held. His low blood sugars were treated with carbohydrates and returned to normal ranges.   Cody Hale struggled with depressive symptoms during his hospital stay and has many medication adjustments. Patient became more depressed when finding out his wife of thirty years wanted a divorce and also issued a  restraining order. For a time this caused the patient to feel more depressed and suicidal. Patient talked to his sister a great deal and worked out a plan for him after discharge. He would be able to stay with them until he can get a place of his own. Patient verbalized that the relationship with his wife had become unhealthy over the years and that he had come to grips with the upcoming changes.   Patient met with MD and NP on the morning of his discharge. Pt symptoms, treatment plan and response to treatment discussed. Abhijay D Viney endorsed that their symptoms have improved. Pt also stated that they are stable for discharge.  In other to control Principal Problem:   Major depressive disorder, recurrent episode, moderate , they will continue psychiatric care on outpatient basis. They will follow-up at  Follow-up Information   Follow up with Florencia Reasons [facility] Today. (You scheduled with Florencia Reasons on Wednesday, January 06, 2013 at 3:00 PM.  Please aririve at 2:45 to complete registrations)    Contact information:   [address] [place],, [place]   [address]  [phone]      Follow up with Dr. Lolly Mustache - [facility] On 01/26/2013. (You are scheduled to see Dr. Lolly Mustache on Tuesday, January 26, 2013 at3 PM)    Contact information:   [address] [place], [place]   [address]  [phone]    .  In addition they were instructed to take all your medications as prescribed by your mental healthcare provider, to report any adverse effects and or reactions from your medicines to your outpatient provider promptly, patient is instructed and cautioned to not engage in alcohol and or illegal drug use while on prescription medicines, in the event of worsening symptoms, patient is instructed to call the crisis hotline, 911 and or go to the nearest ED for appropriate evaluation and treatment of symptoms. He was provided with prescriptions for his new medications and also was given a sample supply.    Upon discharge, patient adamantly denies suicidal, homicidal ideations, auditory, visual hallucinations and or delusional thinking. They left [facility] with all personal belongings in no apparent distress.  Consults:  See electronic record for details  Significant Diagnostic Studies:  See electronic record for details  Discharge Vitals:   Blood pressure 98/53, pulse 57, temperature 97.5 F (36.4 C), temperature source Oral, resp. rate 16, height 5\' 9"  (1.753 m), weight 116.574 kg (257 lb), SpO2 97.00%..  Mental Status Exam: See Mental Status Examination and Suicide Risk Assessment completed by Attending Physician prior to discharge.  Discharge destination:  Home  Is patient on multiple antipsychotic therapies at discharge:  No  Has Patient had three or more failed trials of antipsychotic monotherapy  by history: N/A Recommended Plan for Multiple Antipsychotic Therapies: N/A Discharge Orders   Future Appointments Provider Department Dept Phone   01/06/2013 9:30 AM [facility] [place] [facility] [phone]   01/07/2013 10:15 AM Jeoffrey Massed, MD [facility] [place] [phone]   01/08/2013 9:30 AM [facility] [place] [facility] [phone]   01/11/2013 9:30 AM [facility] 8 [place] [facility] [phone]   01/13/2013 9:30 AM [facility] 8 [place] [facility] [phone]   01/15/2013 9:30 AM [facility] 8 [place] [facility] [phone]   01/18/2013 9:30 AM [facility] 8 [place] [facility] [phone]   01/20/2013 9:30 AM [facility] 8 [place] [facility] [phone]   01/22/2013 9:30 AM [facility] 8 [place] [facility] [phone]   01/25/2013 9:30 AM [facility] 8 [place] [facility] [phone]   01/27/2013 9:30 AM Rollene Rotunda, MD  [facility] [place] [phone]   01/27/2013 9:30 AM  [facility] [place] [facility] [phone]   01/29/2013 9:30 AM [facility] 8 [place] [facility] [phone]   02/01/2013 9:30 AM [facility] 8 [place] [facility] [phone]   02/03/2013 9:30 AM [facility] 8 [place] [facility] [phone]   02/05/2013 9:30 AM [facility] 8 [place] [facility] [phone]   02/08/2013 9:30 AM [facility] Monitor 8 [place] [facility] [phone]   02/10/2013 9:30 AM [facility] 8 [place] [facility] [phone]   02/12/2013 9:30 AM [facility] Monitor 8 [place] [facility] [phone]   02/15/2013 9:30 AM [facility] 8 [place] [facility] [phone]   02/17/2013 9:30 AM [facility] 8 [place] [facility] [phone]   02/19/2013 9:30 AM [facility] Monitor 8 [place] [facility] [phone]   02/22/2013 9:30 AM [facility] Monitor 8 [place] [facility] [phone]   Future Orders Complete By Expires     Activity as tolerated - No restrictions  As directed         Medication List       Indication   buPROPion 150 MG 24 hr tablet  Commonly known as:  WELLBUTRIN XL  Take 1 tablet (150 mg total) by mouth daily. For depression   Indication:  Major Depressive Disorder     carvedilol 12.5 MG tablet  Commonly known as:  COREG  Take 1 tablet (12.5 mg total) by mouth 2 (two) times daily with a meal.   Indication:  High Blood Pressure of Unknown Cause     citalopram 20 MG tablet  Commonly known as:  CELEXA  Take 1 tablet (20 mg total) by mouth daily.   Indication:  Depression     FISH OIL PO  Take 1 capsule by mouth daily.      furosemide 40 MG tablet  Commonly known as:  LASIX  Take 1 tablet (40 mg total) by mouth daily.   Indication:  High Blood Pressure     glipiZIDE 10 MG tablet  Commonly known as:  GLUCOTROL  Take 1 tablet (10 mg total) by mouth 2  (two) times daily before a meal.   Indication:  Type 2 Diabetes     lisinopril 40 MG tablet  Commonly known as:  PRINIVIL,ZESTRIL  Take 1 tablet (40 mg total) by mouth daily.   Indication:  Severe Rapidly Progressing Hypertension     metoCLOPramide 10 MG tablet  Commonly known as:  REGLAN  Take 1 tablet (10 mg total)  by mouth 4 (four) times daily -  before meals and at bedtime. To prevent vomiting   Indication:  Gastric Stasis, Gastroparesis     multivitamin with minerals Tabs  Take 1 tablet by mouth daily.      nitroGLYCERIN 0.4 MG SL tablet  Commonly known as:  NITROSTAT  Place 1 tablet (0.4 mg total) under the tongue every 5 (five) minutes x 3 doses as needed for chest pain.   Indication:  Acute Angina Pectoris     spironolactone 25 MG tablet  Commonly known as:  ALDACTONE  Take 1 tablet (25 mg total) by mouth daily.   Indication:  High Blood Pressure     traZODone 150 MG tablet  Commonly known as:  DESYREL  Take 1 tablet (150 mg total) by mouth at bedtime and may repeat dose one time if needed. For sleep.   Indication:  Trouble Sleeping, Major Depressive Disorder           Follow-up Information   Follow up with Florencia Reasons - [facility] Today. (You scheduled with Florencia Reasons on Wednesday, January 06, 2013 at 3:00 PM.  Please aririve at 2:45 to complete registrations)    Contact information:   [address] [place],, [place]   [address]  [phone]      Follow up with Dr. Lolly Mustache - [facility] On 01/26/2013. (You are scheduled to see Dr. Lolly Mustache on Tuesday, January 26, 2013 at3 PM)    Contact information:   [address] [place], [place]   [address]  [phone]     Follow-up recommendations:   Activities: Resume typical activities Diet: Resume typical diet Tests: none Other: Follow up with outpatient provider and report any side effects to out patient prescriber.  Comments:  Take all your medications as prescribed by your mental healthcare  provider. Report any adverse effects and or reactions from your medicines to your outpatient provider promptly. Patient is instructed and cautioned to not engage in alcohol and or illegal drug use while on prescription medicines. In the event of worsening symptoms, patient is instructed to call the crisis hotline, 911 and or go to the nearest ED for appropriate evaluation and treatment of symptoms. Follow-up with your primary care provider for your other medical issues, concerns and or health care needs.  SignedFransisca Kaufmann NP-C 01/05/2013 11:15 AM   Patient is personally seen and evaluated along with NP and case discussed with case manager and developed plan of care. Reviewed the information documented and agree with the treatment plan.  Kemaya Dorner,JANARDHAHA R. 01/05/2013 7:05 PM

## 2013-01-05 NOTE — BHH Group Notes (Signed)
[facility] LCSW Aftercare Discharge Planning Group Note   01/05/2013 11:11 AM  Participation Quality:  Approprite  Mood/Affect: Appropriate  Depression Rating:  0  Anxiety Rating:  0  Thoughts of Suicide: No  Will you contract for safety?  N/A  Current AVH:  No  Plan for Discharge/Comments:  Patient reports doing well and ready to discharge to sister's home today.  Patient reports sister's family is very upbeat and will help him get back on his feet.  Transportation Means:   Patient has transportation.   Supports:  Patient has a good support system.   Sierah Lacewell, Joesph July

## 2013-01-05 NOTE — Plan of Care (Signed)
Problem: Alteration in mood Goal: LTG-Pt's behavior demonstrates decreased signs of depression Patient is rating depression at eight. He is easily engaged in discussions. Follow up to be be scheduled.  Horace Porteous Matteson Blue, LCSW 12/28/2012 Outcome: Completed/Met Date Met:  01/05/13 Patient is rating depression at zero.  He has attended groups and easily engaged in discussions.  Outpatient follow up is scheduled [facility]  [facility], LCSW 01/05/2013

## 2013-01-06 ENCOUNTER — Ambulatory Visit: Payer: Self-pay | Admitting: Cardiology

## 2013-01-06 ENCOUNTER — Ambulatory Visit (HOSPITAL_COMMUNITY): Payer: Self-pay | Admitting: Psychiatry

## 2013-01-06 ENCOUNTER — Institutional Professional Consult (permissible substitution): Payer: Self-pay | Admitting: Internal Medicine

## 2013-01-06 ENCOUNTER — Encounter (HOSPITAL_COMMUNITY): Payer: BC Managed Care – PPO

## 2013-01-07 ENCOUNTER — Encounter: Payer: Self-pay | Admitting: Family Medicine

## 2013-01-07 ENCOUNTER — Ambulatory Visit (INDEPENDENT_AMBULATORY_CARE_PROVIDER_SITE_OTHER): Payer: BC Managed Care – PPO | Admitting: Family Medicine

## 2013-01-07 VITALS — BP 89/55 | HR 74 | Temp 97.9°F | Resp 16 | Ht 69.0 in | Wt 250.0 lb

## 2013-01-07 DIAGNOSIS — I10 Essential (primary) hypertension: Secondary | ICD-10-CM

## 2013-01-07 DIAGNOSIS — J069 Acute upper respiratory infection, unspecified: Secondary | ICD-10-CM

## 2013-01-07 DIAGNOSIS — F331 Major depressive disorder, recurrent, moderate: Secondary | ICD-10-CM

## 2013-01-07 MED ORDER — CARVEDILOL 12.5 MG PO TABS: 6.2500 mg | ORAL_TABLET | Freq: Two times a day (BID) | ORAL | Status: DC
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
## 2013-01-07 NOTE — Progress Notes (Signed)
Patient Discharge Instructions:  Next Level Care Provider Has Access to the EMR, 01/07/13 Records provided to [facility] via [facility]/Epic access.  Jerelene Redden, 01/07/2013, 2:56 PM

## 2013-01-08 ENCOUNTER — Encounter (HOSPITAL_COMMUNITY): Payer: BC Managed Care – PPO

## 2013-01-11 ENCOUNTER — Encounter (HOSPITAL_COMMUNITY): Payer: BC Managed Care – PPO

## 2013-01-13 ENCOUNTER — Encounter (HOSPITAL_COMMUNITY)
Admission: RE | Admit: 2013-01-13 | Discharge: 2013-01-13 | Disposition: A | Discharge status: Home / Self Care | Payer: BC Managed Care – PPO | Source: Ambulatory Visit | Attending: Cardiology | Admitting: Cardiology

## 2013-01-13 ENCOUNTER — Encounter (HOSPITAL_COMMUNITY): Payer: BC Managed Care – PPO

## 2013-01-13 DIAGNOSIS — I5032 Chronic diastolic (congestive) heart failure: Secondary | ICD-10-CM | POA: Insufficient documentation

## 2013-01-13 DIAGNOSIS — Z5189 Encounter for other specified aftercare: Secondary | ICD-10-CM | POA: Insufficient documentation

## 2013-01-13 DIAGNOSIS — I059 Rheumatic mitral valve disease, unspecified: Secondary | ICD-10-CM | POA: Insufficient documentation

## 2013-01-15 ENCOUNTER — Encounter (HOSPITAL_COMMUNITY)
Admission: RE | Admit: 2013-01-15 | Discharge: 2013-01-15 | Disposition: A | Discharge status: Home / Self Care | Payer: BC Managed Care – PPO | Source: Ambulatory Visit | Attending: Cardiology | Admitting: Cardiology

## 2013-01-15 ENCOUNTER — Encounter (HOSPITAL_COMMUNITY): Payer: BC Managed Care – PPO

## 2013-01-18 ENCOUNTER — Encounter (HOSPITAL_COMMUNITY): Payer: BC Managed Care – PPO

## 2013-01-18 ENCOUNTER — Encounter (HOSPITAL_COMMUNITY)
Admission: RE | Admit: 2013-01-18 | Discharge: 2013-01-18 | Disposition: A | Discharge status: Home / Self Care | Payer: BC Managed Care – PPO | Source: Ambulatory Visit | Attending: Cardiology | Admitting: Cardiology

## 2013-01-20 ENCOUNTER — Encounter (HOSPITAL_COMMUNITY)
Admission: RE | Admit: 2013-01-20 | Discharge: 2013-01-20 | Disposition: A | Discharge status: Home / Self Care | Payer: BC Managed Care – PPO | Source: Ambulatory Visit | Attending: Cardiology | Admitting: Cardiology

## 2013-01-20 ENCOUNTER — Encounter (HOSPITAL_COMMUNITY): Payer: BC Managed Care – PPO

## 2013-01-21 ENCOUNTER — Ambulatory Visit (INDEPENDENT_AMBULATORY_CARE_PROVIDER_SITE_OTHER): Payer: BC Managed Care – PPO | Admitting: Family Medicine

## 2013-01-21 ENCOUNTER — Encounter: Payer: Self-pay | Admitting: Family Medicine

## 2013-01-21 VITALS — BP 106/58 | HR 56 | Temp 97.8°F | Resp 16 | Ht 69.0 in | Wt 249.0 lb

## 2013-01-21 DIAGNOSIS — J069 Acute upper respiratory infection, unspecified: Secondary | ICD-10-CM | POA: Insufficient documentation

## 2013-01-21 DIAGNOSIS — F32A Depression, unspecified: Secondary | ICD-10-CM

## 2013-01-21 DIAGNOSIS — F329 Major depressive disorder, single episode, unspecified: Secondary | ICD-10-CM

## 2013-01-21 DIAGNOSIS — R071 Chest pain on breathing: Secondary | ICD-10-CM

## 2013-01-21 DIAGNOSIS — I10 Essential (primary) hypertension: Secondary | ICD-10-CM

## 2013-01-21 DIAGNOSIS — R0789 Other chest pain: Secondary | ICD-10-CM | POA: Insufficient documentation

## 2013-01-21 NOTE — Assessment & Plan Note (Signed)
Recent addition of 12.5mg  bid coreg when in hospital in [place] has caused some symptomatic low bps, so we'll cut back to 1/2 tab of the 12.5mg  dose bid.

## 2013-01-21 NOTE — Assessment & Plan Note (Signed)
Continue wellbutrin XL 150mg  qd and citalopram 20mg  qd. He has counseling set up in [place] and also has f/u with a [facility] psychiatrist on 01/26/13.

## 2013-01-21 NOTE — Assessment & Plan Note (Signed)
BPs better--lowest 100/60 but most of the time is higher into low normal range. Continue current meds for HTN/CHF. Add ASA 81mg  qd back onto med regimen.

## 2013-01-21 NOTE — Progress Notes (Signed)
OFFICE NOTE  01/21/2013  CC:  Chief Complaint  Patient presents with  . Hypotension  . Follow-up    2 week     HPI: Patient is a 62 y.o. Caucasian male who is here for 2 week f/u HTN and depression. Last visit we decreased his coreg to 1/2 of 12.5mg  tab bid.  BP "up and down".   Lowest 100/60, HR 60s.  BP usually 120s/50-60.  No lightheadedness with postural changes b/c he takes his time. Mood is slowly improving on citalopram and wellbutrin.  Has had trouble getting in touch with counselor but is going to call them back ([place]). He laments about the way he split with his wife as a result of his recent mental troubles.  No SI or HI.  Has had a few days of a sharp, intermittent pain in right anterolateral rib region--lower area, hurts when he bends a certain way or when taking a deep breath.  Has had URI recently and some mild cough.  No fevers.  NO SOB, no wheezing. No exertional CP.  No nausea.  Appetite is normal and the pain does not come with eating.   Pertinent PMH:  Past Medical History  Diagnosis Date  . Diabetes mellitus, type II     Gastroparesis; GI care at [facility]  . Left leg cellulitis   . History of tobacco abuse   . Hypertension   . Degenerative joint disease   . Morbid obesity 06/27/2012  . 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 s/p zyvox rx.  (Cultures never positive); left bundle branch block; H/o SVT; 09/2012: bioprosthetic MVR at [facility]; a. Severe dental caries and cavities s/p multiple extractions.  . Major depressive disorder, recurrent severe without psychotic features     BH admission 12/2012  . Urinary tract infection 08/30/2012    Proteus mirabilis  . Chronic venous insufficiency   . Nephrolithiasis   . 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 Systolic heart failure 11/2012   Past surgical, social, and family history reviewed and no changes noted since last office visit.  MEDS:  Outpatient Prescriptions Prior to Visit  Medication  Sig Dispense Refill  . buPROPion (WELLBUTRIN XL) 150 MG 24 hr tablet Take 1 tablet (150 mg total) by mouth daily. For depression  30 tablet  0  . carvedilol (COREG) 12.5 MG tablet Take 0.5 tablets (6.25 mg total) by mouth 2 (two) times daily with a meal.  60 tablet  0  . citalopram (CELEXA) 20 MG tablet Take 1 tablet (20 mg total) by mouth daily.  30 tablet  0  . furosemide (LASIX) 40 MG tablet Take 1 tablet (40 mg total) by mouth daily.  30 tablet  0  . glipiZIDE (GLUCOTROL) 10 MG tablet Take 1 tablet (10 mg total) by mouth 2 (two) times daily before a meal.      . lisinopril (PRINIVIL,ZESTRIL) 40 MG tablet Take 1 tablet (40 mg total) by mouth daily.  30 tablet  0  . metoCLOPramide (REGLAN) 10 MG tablet Take 1 tablet (10 mg total) by mouth 4 (four) times daily -  before meals and at bedtime. To prevent vomiting  90 tablet  0  . Multiple Vitamin (MULTIVITAMIN WITH MINERALS) TABS Take 1 tablet by mouth daily.      . nitroGLYCERIN (NITROSTAT) 0.4 MG SL tablet Place 1 tablet (0.4 mg total) under the tongue every 5 (five) minutes x 3 doses as needed for chest pain.  25 tablet  3  .  Omega-3 Fatty Acids (FISH OIL PO) Take 1 capsule by mouth daily.      Marland Kitchen spironolactone (ALDACTONE) 25 MG tablet Take 1 tablet (25 mg total) by mouth daily.  30 tablet  0  . traZODone (DESYREL) 150 MG tablet Take 1 tablet (150 mg total) by mouth at bedtime and may repeat dose one time if needed. For sleep.  60 tablet  0   No facility-administered medications prior to visit.    PE: Blood pressure 106/58, pulse 56, temperature 97.8 F (36.6 C), temperature source Oral, resp. rate 16, height 5\' 9"  (1.753 m), weight 249 lb (112.946 kg), SpO2 99.00%. Gen: Alert, well appearing.  Patient is oriented to person, place, time, and situation. ENT:  Eyes: no injection, icteris, swelling, or exudate.  EOMI, PERRLA. Nose: no drainage or turbinate edema/swelling.  No injection or focal lesion.  Mouth: lips without lesion/swelling.  Oral  mucosa pink and moist.    Oropharynx without erythema, exudate, or swelling.  CV: RRR, distant S1 and S2. Lungs: CTA bilat, nonlabored Right inferolateral and inferoanterior chest wall with mild TTP. EXT: no clubbing or cyanosis.  Trace bilat pitting edema.  LABS: none today  IMPRESSION AND PLAN:  Depression Improving slowly, as expected.  Still grieving over the splitting up of his marriage due to his mental breakdown recently. Continue current meds and keep psychiatrist f/u scheduled for later this month.  Also, I encouraged him to call back about setting up the counseling appt even though he didn't get a return call from them.   Hypertension BPs better--lowest 100/60 but most of the time is higher into low normal range. Continue current meds for HTN/CHF. Add ASA 81mg  qd back onto med regimen.    Right-sided chest wall pain Vs pleuritic chest pain. Reassured pt of self limited nature of this problem.  No meds recommended at this time. Signs/symptoms to call or return for were reviewed and pt expressed understanding.    An After Visit Summary was printed and given to the patient.  FOLLOW UP: He'll be transferring care to [facility] in [place] and I told him to try to f/u there in about 1 mo.

## 2013-01-21 NOTE — Assessment & Plan Note (Signed)
Improving slowly, as expected.  Still grieving over the splitting up of his marriage due to his mental breakdown recently. Continue current meds and keep psychiatrist f/u scheduled for later this month.  Also, I encouraged him to call back about setting up the counseling appt even though he didn't get a return call from them.

## 2013-01-21 NOTE — Assessment & Plan Note (Signed)
Vs pleuritic chest pain. Reassured pt of self limited nature of this problem.  No meds recommended at this time. Signs/symptoms to call or return for were reviewed and pt expressed understanding.

## 2013-01-21 NOTE — Assessment & Plan Note (Signed)
Symptomatic care with saline nasal spray recommended.  No other meds for this at this time.

## 2013-01-22 ENCOUNTER — Encounter (HOSPITAL_COMMUNITY): Payer: BC Managed Care – PPO

## 2013-01-22 ENCOUNTER — Encounter (HOSPITAL_COMMUNITY)
Admission: RE | Admit: 2013-01-22 | Discharge: 2013-01-22 | Disposition: A | Discharge status: Home / Self Care | Payer: BC Managed Care – PPO | Source: Ambulatory Visit | Attending: Cardiology | Admitting: Cardiology

## 2013-01-25 ENCOUNTER — Encounter (HOSPITAL_COMMUNITY)
Admission: RE | Admit: 2013-01-25 | Discharge: 2013-01-25 | Disposition: A | Discharge status: Home / Self Care | Payer: BC Managed Care – PPO | Source: Ambulatory Visit | Attending: Cardiology | Admitting: Cardiology

## 2013-01-25 ENCOUNTER — Encounter (HOSPITAL_COMMUNITY): Payer: BC Managed Care – PPO

## 2013-01-26 ENCOUNTER — Encounter (HOSPITAL_COMMUNITY): Payer: Self-pay | Admitting: Psychiatry

## 2013-01-26 ENCOUNTER — Ambulatory Visit (INDEPENDENT_AMBULATORY_CARE_PROVIDER_SITE_OTHER): Payer: BC Managed Care – PPO | Admitting: Psychiatry

## 2013-01-26 ENCOUNTER — Other Ambulatory Visit: Payer: Self-pay | Admitting: Family Medicine

## 2013-01-26 VITALS — BP 87/42 | HR 75 | Ht 69.0 in | Wt 248.2 lb

## 2013-01-26 DIAGNOSIS — I340 Nonrheumatic mitral (valve) insufficiency: Secondary | ICD-10-CM

## 2013-01-26 DIAGNOSIS — F331 Major depressive disorder, recurrent, moderate: Secondary | ICD-10-CM

## 2013-01-26 MED ORDER — SPIRONOLACTONE 25 MG PO TABS: 25.0000 mg | ORAL_TABLET | Freq: Every day | ORAL | Status: DC

## 2013-01-26 MED ORDER — LISINOPRIL 40 MG PO TABS: 40.0000 mg | ORAL_TABLET | Freq: Every day | ORAL | Status: DC

## 2013-01-26 NOTE — Progress Notes (Signed)
Patient ID: Cody Hale, male   DOB: [birth year], 62 y.o.   MRN: [id number]  Psychiatric assessment note  Chief complaint Patient is 62 year old Caucasian employed currently separated name came for his appointment.  Patient was recently discharged from behavioral center after having severe depression and suicidal thinking.  He wants to establish his care in this office.  History of presenting illness. Patient endorsed long history of depression and mood swings.  He admitted getting worse and past few months.  His stressors are recent heart surgery and separation from his wife.  Patient endorsed his agitation anger and mood swings a big part into his medical issues.  Currently the patient has a restraining order from his wi.  He is living with his sister.  Patient admitted on June 21 and discharged on July 1.  Apparently his wife called 911 because patient was threatening to kill himself.  During hospitalization he was treated on trazodone Wellbutrin and Celexa.  Patient is feeling much better.  He denies any current suicidal thoughts or homicidal thoughts however he still feels some symptoms of depression.  He endorsed improvement in his sleep but complained of racing thoughts and chronic feelings of hopelessness and helplessness.  She denies any aggression or violence since discharge from the hospital.  He wants to live for his grandson .  He denies any paranoia or hallucination.  He has a panic attack but admitted that he is about his physical health and manage.  He admitted being rude to his wife and endorsed anger issues.  Prior to hospitalization he was seen primary care physician who started him on an antidepressant but he cannot remember the dosage.  Patient is scheduled to see a therapist in [place] but due to the court date he could not come to the appointment.  Patient like to followup with a therapist for counseling.  Patient denies drinking or any drug issues.    Past psychiatric  history. Patient denies any other previous history of psychiatric inpatient treatment or suicidal attempt.  He has a history of psychosis mania or session.  He's been feeling sad and depressed since he has a heart surgery .  However he admitted history of irritability and anger in his marriage.  He cannot recall seeing psychiatrist prior to his hospitalization.  Medical history. Patient has CHF, hypertension, endocarditis , obstructive sleep apnea, diabetes, morbid obesity and chronic pain.  Patient sees Dr. Lynford Humphrey  for her primary care needs and cardiologist in [place].  Patient has multiple hospitalization since he had valve surgery.    Alcohol and substance use history. Patient endorsed history of heavy drinking in the past.  Denies any recent drinking or using any illicit substance.  Family history Patient has a family history of psychiatric illness  Psychosocial history. Patient has been married for 33 years.  He has one daughter who lives in [place].  He has 2 grand baby.  Currently patient is living with his sister.  He used to work in [employer] .  Review of Systems  HENT: Negative.   Cardiovascular: Positive for palpitations.  Skin: Negative.   Neurological: Positive for weakness. Negative for dizziness, tingling, tremors, sensory change, speech change, focal weakness, seizures and loss of consciousness.  Psychiatric/Behavioral: Positive for depression. Negative for suicidal ideas, hallucinations, memory loss and substance abuse. The patient is nervous/anxious and has insomnia.     Mental status examination Patient is casually dressed and fairly groomed.  He appears to be his stated age.  His speech is rambling at times but normal tone and volume.  His thought processes logical.  He described his mood is anxious and depressed and his affect is constricted.  He denied any active or passive cell carcinoma so far.  He denied auditory or visual hallucination.  There were no  paranoid delusion is present at this time.  His attention and concentration is fair.  He is alert and oriented x3.  His fund of knowledge is adequate.  There were no tremors or shakes.  His insight judgment and impulse control is okay  Assessment Axis I Maj. depressive disorder , recurrent Axis II deferred Axis III see medical history Axis IV mild-to-moderate Axis V 65  Plan At this time patient is doing better on his current psychiatric medication is trazodone 150 mg at bedtime, Celexa 20 mg daily and Wellbutrin 150 mg daily.  Patient is not having side effects at this time.  His like to followup with her therapist for counseling.  He is in the process of changing his primary care physician and cardiologist.  He has a recent paranoia or any active or passive suicidal thoughts.  Discussed safety plans at a time having active suicidal thoughts or homicidal thoughts and he called 911 the local emergency room.  I will see him in 4 weeks.  Time spent 60 min.

## 2013-01-26 NOTE — Telephone Encounter (Signed)
Filled medication per [facility] protocol

## 2013-01-27 ENCOUNTER — Encounter (HOSPITAL_COMMUNITY): Payer: BC Managed Care – PPO

## 2013-01-27 ENCOUNTER — Encounter: Payer: Self-pay | Admitting: Cardiology

## 2013-01-27 ENCOUNTER — Ambulatory Visit (INDEPENDENT_AMBULATORY_CARE_PROVIDER_SITE_OTHER): Payer: BC Managed Care – PPO | Admitting: Cardiology

## 2013-01-27 VITALS — BP 125/68 | HR 65 | Ht 69.0 in | Wt 248.0 lb

## 2013-01-27 DIAGNOSIS — Z952 Presence of prosthetic heart valve: Secondary | ICD-10-CM

## 2013-01-27 DIAGNOSIS — Z954 Presence of other heart-valve replacement: Secondary | ICD-10-CM
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
## 2013-01-27 NOTE — Patient Instructions (Addendum)
The current medical regimen is effective;  continue present plan and medications.  Please have blood work drawn at [facility]  (B[facility])  Follow up in 6 months with Dr Antoine Poche.  You will receive a letter in the mail 2 months before you are due.  Please call us when you receive this letter to schedule your follow up appointment.

## 2013-01-29 ENCOUNTER — Encounter (HOSPITAL_COMMUNITY): Payer: BC Managed Care – PPO

## 2013-02-01 ENCOUNTER — Encounter (HOSPITAL_COMMUNITY): Payer: BC Managed Care – PPO

## 2013-02-02 ENCOUNTER — Ambulatory Visit (INDEPENDENT_AMBULATORY_CARE_PROVIDER_SITE_OTHER): Payer: BC Managed Care – PPO | Admitting: Psychiatry

## 2013-02-02 DIAGNOSIS — F331 Major depressive disorder, recurrent, moderate: Secondary | ICD-10-CM

## 2013-02-03 ENCOUNTER — Encounter (HOSPITAL_COMMUNITY): Payer: BC Managed Care – PPO

## 2013-02-04 ENCOUNTER — Encounter: Payer: Self-pay | Admitting: Nurse Practitioner

## 2013-02-04 ENCOUNTER — Encounter (HOSPITAL_COMMUNITY): Payer: Self-pay | Admitting: *Deleted

## 2013-02-04 ENCOUNTER — Emergency Department (HOSPITAL_COMMUNITY)
Admission: EM | Admit: 2013-02-04 | Discharge: 2013-02-05 | Disposition: A | Discharge status: Home / Self Care | Payer: BC Managed Care – PPO | Attending: Emergency Medicine | Admitting: Emergency Medicine

## 2013-02-04 DIAGNOSIS — Z87891 Personal history of nicotine dependence: Secondary | ICD-10-CM | POA: Insufficient documentation

## 2013-02-04 DIAGNOSIS — N179 Acute kidney failure, unspecified: Secondary | ICD-10-CM | POA: Insufficient documentation

## 2013-02-04 DIAGNOSIS — Z9889 Other specified postprocedural states: Secondary | ICD-10-CM | POA: Insufficient documentation

## 2013-02-04 DIAGNOSIS — R42 Dizziness and giddiness: Secondary | ICD-10-CM | POA: Insufficient documentation

## 2013-02-04 DIAGNOSIS — Z872 Personal history of diseases of the skin and subcutaneous tissue: Secondary | ICD-10-CM | POA: Insufficient documentation

## 2013-02-04 DIAGNOSIS — E119 Type 2 diabetes mellitus without complications: Secondary | ICD-10-CM | POA: Insufficient documentation

## 2013-02-04 DIAGNOSIS — E860 Dehydration: Secondary | ICD-10-CM

## 2013-02-04 DIAGNOSIS — H53149 Visual discomfort, unspecified: Secondary | ICD-10-CM | POA: Insufficient documentation

## 2013-02-04 DIAGNOSIS — Z862 Personal history of diseases of the blood and blood-forming organs and certain disorders involving the immune mechanism: Secondary | ICD-10-CM | POA: Insufficient documentation

## 2013-02-04 DIAGNOSIS — Z954 Presence of other heart-valve replacement: Secondary | ICD-10-CM | POA: Insufficient documentation

## 2013-02-04 DIAGNOSIS — M79609 Pain in unspecified limb: Secondary | ICD-10-CM | POA: Insufficient documentation

## 2013-02-04 DIAGNOSIS — Z9861 Coronary angioplasty status: Secondary | ICD-10-CM | POA: Insufficient documentation

## 2013-02-04 DIAGNOSIS — Z8659 Personal history of other mental and behavioral disorders: Secondary | ICD-10-CM | POA: Insufficient documentation

## 2013-02-04 DIAGNOSIS — Z79899 Other long term (current) drug therapy: Secondary | ICD-10-CM | POA: Insufficient documentation

## 2013-02-04 DIAGNOSIS — Z87442 Personal history of urinary calculi: Secondary | ICD-10-CM | POA: Insufficient documentation

## 2013-02-04 DIAGNOSIS — IMO0001 Reserved for inherently not codable concepts without codable children: Secondary | ICD-10-CM | POA: Insufficient documentation

## 2013-02-04 DIAGNOSIS — Z8639 Personal history of other endocrine, nutritional and metabolic disease: Secondary | ICD-10-CM | POA: Insufficient documentation

## 2013-02-04 DIAGNOSIS — M542 Cervicalgia: Secondary | ICD-10-CM | POA: Insufficient documentation

## 2013-02-04 DIAGNOSIS — Z87440 Personal history of urinary (tract) infections: Secondary | ICD-10-CM | POA: Insufficient documentation

## 2013-02-04 DIAGNOSIS — I10 Essential (primary) hypertension: Secondary | ICD-10-CM | POA: Insufficient documentation

## 2013-02-04 DIAGNOSIS — Z8679 Personal history of other diseases of the circulatory system: Secondary | ICD-10-CM | POA: Insufficient documentation

## 2013-02-04 DIAGNOSIS — Z7982 Long term (current) use of aspirin: Secondary | ICD-10-CM | POA: Insufficient documentation

## 2013-02-04 DIAGNOSIS — M1990 Unspecified osteoarthritis, unspecified site: Secondary | ICD-10-CM | POA: Insufficient documentation

## 2013-02-04 DIAGNOSIS — I5020 Unspecified systolic (congestive) heart failure: Secondary | ICD-10-CM | POA: Insufficient documentation

## 2013-02-04 DIAGNOSIS — I959 Hypotension, unspecified: Secondary | ICD-10-CM

## 2013-02-04 LAB — CBC WITH DIFFERENTIAL/PLATELET
Basophils Absolute: 0 10*3/uL (ref 0.0–0.1)
Basophils Relative: 0 % (ref 0–1)
Eosinophils Absolute: 0.2 10*3/uL (ref 0.0–0.7)
Eosinophils Relative: 3 % (ref 0–5)
HCT: 36.4 % — ABNORMAL LOW (ref 39.0–52.0)
Hemoglobin: 11.8 g/dL — ABNORMAL LOW (ref 13.0–17.0)
Lymphocytes Relative: 22 % (ref 12–46)
Lymphs Abs: 1.1 10*3/uL (ref 0.7–4.0)
MCH: 27.1 pg (ref 26.0–34.0)
MCHC: 32.4 g/dL (ref 30.0–36.0)
MCV: 83.5 fL (ref 78.0–100.0)
Monocytes Absolute: 0.6 10*3/uL (ref 0.1–1.0)
Monocytes Relative: 12 % (ref 3–12)
Neutro Abs: 3.3 10*3/uL (ref 1.7–7.7)
Neutrophils Relative %: 63 % (ref 43–77)
Platelets: 114 10*3/uL — ABNORMAL LOW (ref 150–400)
RBC: 4.36 MIL/uL (ref 4.22–5.81)
RDW: 18.5 % — ABNORMAL HIGH (ref 11.5–15.5)
Smear Review: DECREASED
WBC: 5.2 10*3/uL (ref 4.0–10.5)

## 2013-02-04 LAB — BASIC METABOLIC PANEL
BUN: 50 mg/dL — ABNORMAL HIGH (ref 6–23)
CO2: 27 mEq/L (ref 19–32)
Calcium: 9.6 mg/dL (ref 8.4–10.5)
Chloride: 96 mEq/L (ref 96–112)
Creatinine, Ser: 1.84 mg/dL — ABNORMAL HIGH (ref 0.50–1.35)
GFR calc Af Amer: 44 mL/min — ABNORMAL LOW (ref >90)
GFR calc non Af Amer: 38 mL/min — ABNORMAL LOW (ref >90)
Glucose, Bld: 104 mg/dL — ABNORMAL HIGH (ref 70–99)
Potassium: 5.2 mEq/L — ABNORMAL HIGH (ref 3.5–5.1)
Sodium: 132 mEq/L — ABNORMAL LOW (ref 135–145)

## 2013-02-04 LAB — TROPONIN I: Troponin I: <0.3 ng/mL (ref <0.30)

## 2013-02-04 MED ORDER — SODIUM CHLORIDE 0.9 % IV BOLUS (SEPSIS)
1000.0000 mL | Freq: Once | INTRAVENOUS | Status: AC
Start: 2013-02-04 — End: 2013-02-05
  Administered 2013-02-04: 1000 mL via INTRAVENOUS

## 2013-02-04 MED ORDER — SODIUM CHLORIDE 0.9 % IV BOLUS (SEPSIS)
1000.0000 mL | Freq: Once | INTRAVENOUS | Status: AC
  Administered 2013-02-04: 1000 mL via INTRAVENOUS

## 2013-02-04 NOTE — ED Notes (Signed)
Ambulated patient, tolerated with no problems.
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
## 2013-02-04 NOTE — ED Notes (Signed)
Pt presents with c/o hypotension per " [facility] B/P machine". Pt also c/o staggering walk, and blurry vision.  Pt also c/o bilateral posterior leg pains.

## 2013-02-04 NOTE — Patient Instructions (Signed)
Discussed orally 

## 2013-02-04 NOTE — Progress Notes (Signed)
Patient:   Cody Hale   DOB:   Nov 11, 1950  MR Number:  [id number]  Location:  [address] [place], [place] [address]  Date of Service:   Tuesday 02/02/2013  Start Time:   1:05 PM End Time:   2:00 PM  Provider/Observer:  Florencia Reasons, MSW, LCSW   Billing Code/Service:  [phone]  Chief Complaint:     Chief Complaint  Patient presents with  . Depression    Reason for Service:  The patient is referred for services by psychiatrist Dr. Lolly Mustache due to patient experiencing symptoms of depression. Patient reports experiencing emotional problems related to medical issues and lifestyle changes from having a heart valve replacement in March 2014. Prior to then, patient had been hospitalized 3 times due to medical issues. He reports becoming depressed and angry and mistreating his wife often arguing and yelling. Patient was hospitalized at the [facility] in June 2014 due to depression and passive suicidal thoughts. He and his wife are separated but are in the process of trying to work out their marriage per patient's report.  Current Status:  Patient reports decreased depressed mood and decreased anger rating both at 1 on a 10 point scale with one being none and 10 being severe. Patient states medication is working well.   Reliability of Information: reliable  Behavioral Observation: Cody Hale  presents as a 62 y.o.-year-old Right Caucasian Male who appeared his stated age. His dress was appropriate and he was casual in appearance. His manners were appropriate to the situation.  There were not any physical disabilities noted.  He displayed an appropriate level of cooperation and motivation.    Interactions:    Active   Attention:   normal  Memory:   normal  Visuo-spatial:   normal  Speech (Volume):  normal  Speech:   normal pitch and normal volume  Thought Process:  Coherent and Relevant  Though Content:  WNL  Orientation:   person, place, time/date, situation, day of  week, month of year and year  Judgment:   Good  Planning:   Good  Affect:    Appropriate  Mood:    Euthymic  Insight:   Good  Intelligence:   normal  Marital Status/Living: Patient was born in [place] and reared in [place]. He is the oldest of 5 siblings. He reports a positive relationship with his biological family. Patient and his wife have been married for 33 years. Patient and wife currently is separated. He resides alone at a hotel in [place] and is hopeful he and his wife will reconcile. . Patient and his wife have a 8 year old daughter. There son died at 4 years old due to a brain tumor.  Current Employment: Retired  Past Employment:  Patient worked at [employer] for 13 years. 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  Substance Use:  No concerns of substance abuse are reported.    Education:   HS Graduate  Medical History:   Past Medical History  Diagnosis Date  . Diabetes mellitus, type II     Gastroparesis; GI care at [facility]  . Left leg cellulitis   . History of tobacco abuse   . Hypertension   . Degenerative joint disease   . Morbid obesity 06/27/2012  . 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 s/p zyvox rx.  (Cultures never positive); left bundle branch block; H/o SVT; 09/2012: bioprosthetic MVR at [facility]; a. Severe dental caries and cavities s/p multiple  extractions.  . Major depressive disorder, recurrent severe without psychotic features     BH admission 12/2012  . Urinary tract infection 08/30/2012    Proteus mirabilis  . Chronic venous insufficiency   . Nephrolithiasis   . 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 Systolic heart failure 11/2012    Sexual History:   History  Sexual Activity  . Sexually Active: Not on file    Abuse/Trauma History: Denies  Psychiatric History:  Patient has had one psychiatric hospitalization at [facility] in [place] which occurred in June 2014 due to to depression and suicidal  thoughts. He reports no previous involvement in outpatient psychotherapy.  Family Med/Psych History:  Family History  Problem Relation Age of Onset  . Lung cancer Father     died @ 33  . Ovarian cancer Mother     died @ 75    Risk of Suicide/Violence: Patient denies any suicidal attempts. He denies having any suicidal ideations since discharge and denies current suicidal ideations. He denies past and current homicidal ideations. Patient denies any self-injurious behaviors, aggression, and violence.  Impression/DX:  The patient presents with a history of symptoms of depression that began last year when patient was experiencing health problems including diabetes that lead to early retirement in December 2013. He continued to have medical issues that necessitated a heart valve replacement in March 2014. Lifestyle changes and adjustments exacerbated symptoms and increased marital stress resulting in psychiatric hospitalization  in June 2014. Symptoms at that time included depression, anger, and suicidal thoughts. Since that time, patient has adjusted well to medication and symptoms have subsided per patient's report. Diagnosis: Major depressive disorder  Disposition/Plan:  The patient attends the assessment appointment today confidentiality and limits are discussed. The patient agrees to return for an appointment in 2 weeks for continuing assessment and treatment planning. The patient agrees to call this practice, call 911, or have someone take him to the emergency room should symptoms worsen  Diagnosis:    Axis I:  Major depressive disorder, recurrent episode, moderate      Axis II: Deferred       Axis III:  See medical history      Axis IV:  problems with primary support group          Axis V:  51-60 moderate symptoms

## 2013-02-05 ENCOUNTER — Encounter (HOSPITAL_COMMUNITY): Payer: BC Managed Care – PPO

## 2013-02-05 ENCOUNTER — Encounter: Payer: Self-pay | Admitting: Family Medicine

## 2013-02-08 ENCOUNTER — Telehealth: Payer: Self-pay | Admitting: Cardiology

## 2013-02-08 ENCOUNTER — Encounter (HOSPITAL_COMMUNITY): Payer: BC Managed Care – PPO

## 2013-02-08 ENCOUNTER — Encounter (HOSPITAL_COMMUNITY)
Admission: RE | Admit: 2013-02-08 | Discharge: 2013-02-08 | Disposition: A | Discharge status: Home / Self Care | Payer: BC Managed Care – PPO | Source: Ambulatory Visit | Attending: Cardiology | Admitting: Cardiology

## 2013-02-08 DIAGNOSIS — I5032 Chronic diastolic (congestive) heart failure: Secondary | ICD-10-CM | POA: Insufficient documentation

## 2013-02-08 DIAGNOSIS — I059 Rheumatic mitral valve disease, unspecified: Secondary | ICD-10-CM | POA: Insufficient documentation

## 2013-02-08 DIAGNOSIS — Z5189 Encounter for other specified aftercare: Secondary | ICD-10-CM | POA: Insufficient documentation

## 2013-02-08 NOTE — Telephone Encounter (Signed)
LMTCB

## 2013-02-08 NOTE — Telephone Encounter (Signed)
New Prob  Pt wife said husband has been seen in hospital recently and is having problems with his BP.  She wants to see Dr Antoine Poche in [place], his first available is in October. I offered appt with a PA in August on the day Dr Antoine Poche is in office and she declined, would rather he only see a Development worker, international aid.

## 2013-02-09 NOTE — Progress Notes (Signed)
Reviewed the information documented and agree with the treatment plan.  Marcello Tuzzolino,JANARDHAHA R. 02/09/2013 12:28 PM

## 2013-02-10 ENCOUNTER — Encounter (HOSPITAL_COMMUNITY): Payer: BC Managed Care – PPO

## 2013-02-10 ENCOUNTER — Other Ambulatory Visit: Payer: Self-pay

## 2013-02-10 NOTE — Telephone Encounter (Signed)
Received call from patient's wife she stated she had to take husband to [facility] ER last week with low B/P 80/41.Stated lasix was stopped.Wife stated she called [place] office and Dr.Hochrein can't see patient until 10/14.Stated she would like husband seen before then.Message sent to [facility] Dr.Hochrein's nurse.Call wife at home # [phone].

## 2013-02-11 ENCOUNTER — Telehealth: Payer: Self-pay | Admitting: Family Medicine

## 2013-02-11 NOTE — Telephone Encounter (Signed)
Patient and his wife are requesting to switch from Dr. Milinda Cave to [facility], please advise

## 2013-02-11 NOTE — Telephone Encounter (Signed)
ok 

## 2013-02-11 NOTE — Telephone Encounter (Signed)
Phone unable to accept calls/messages at this time - try again later.  ? Has pt f/ued with PCP as requested and had blood work?  ED notes - Stop taking spironolactone. Do not take lasix (furosemide) tomorrow. Start taking lasix again on Saturday (8/2) at decreased dose of 20mg  per day. Keep your other medications the same. You need to follow-up with your PCP or cardiologist early next week and you need to have repeat blood work. Return for any symptoms as discussed or anything else concerning to you.

## 2013-02-11 NOTE — Telephone Encounter (Signed)
OK, but did they give any specific reason for the switch?

## 2013-02-11 NOTE — Telephone Encounter (Signed)
Appointment was scheduled for 8/21 in the [place] office

## 2013-02-12 ENCOUNTER — Encounter (HOSPITAL_COMMUNITY): Payer: BC Managed Care – PPO

## 2013-02-12 ENCOUNTER — Encounter (HOSPITAL_COMMUNITY)
Admission: RE | Admit: 2013-02-12 | Discharge: 2013-02-12 | Disposition: A | Discharge status: Home / Self Care | Payer: BC Managed Care – PPO | Source: Ambulatory Visit | Attending: Cardiology | Admitting: Cardiology

## 2013-02-12 NOTE — Telephone Encounter (Signed)
OK. Thanks.

## 2013-02-12 NOTE — Telephone Encounter (Signed)
Patient says he does not think you are listening to his health concerns

## 2013-02-15 ENCOUNTER — Encounter (HOSPITAL_COMMUNITY): Payer: BC Managed Care – PPO

## 2013-02-15 ENCOUNTER — Ambulatory Visit: Payer: Self-pay | Admitting: Thoracic Surgery (Cardiothoracic Vascular Surgery)

## 2013-02-15 ENCOUNTER — Encounter (HOSPITAL_COMMUNITY)
Admission: RE | Admit: 2013-02-15 | Discharge: 2013-02-15 | Disposition: A | Discharge status: Home / Self Care | Payer: BC Managed Care – PPO | Source: Ambulatory Visit | Attending: Cardiology | Admitting: Cardiology

## 2013-02-16 ENCOUNTER — Ambulatory Visit (HOSPITAL_COMMUNITY): Payer: Self-pay | Admitting: Psychiatry

## 2013-02-16 ENCOUNTER — Encounter (HOSPITAL_COMMUNITY): Payer: Self-pay | Admitting: *Deleted

## 2013-02-16 ENCOUNTER — Emergency Department (HOSPITAL_COMMUNITY): Payer: BC Managed Care – PPO

## 2013-02-16 ENCOUNTER — Emergency Department (HOSPITAL_COMMUNITY)
Admission: EM | Admit: 2013-02-16 | Discharge: 2013-02-16 | Disposition: A | Discharge status: Home / Self Care | Payer: BC Managed Care – PPO | Attending: Emergency Medicine | Admitting: Emergency Medicine

## 2013-02-16 ENCOUNTER — Other Ambulatory Visit (HOSPITAL_COMMUNITY): Payer: Self-pay | Admitting: *Deleted

## 2013-02-16 DIAGNOSIS — I951 Orthostatic hypotension: Secondary | ICD-10-CM | POA: Insufficient documentation

## 2013-02-16 DIAGNOSIS — Z87891 Personal history of nicotine dependence: Secondary | ICD-10-CM | POA: Insufficient documentation

## 2013-02-16 DIAGNOSIS — R5381 Other malaise: Secondary | ICD-10-CM | POA: Insufficient documentation

## 2013-02-16 DIAGNOSIS — Z7982 Long term (current) use of aspirin: Secondary | ICD-10-CM | POA: Insufficient documentation

## 2013-02-16 DIAGNOSIS — Z79899 Other long term (current) drug therapy: Secondary | ICD-10-CM | POA: Insufficient documentation

## 2013-02-16 DIAGNOSIS — Z87442 Personal history of urinary calculi: Secondary | ICD-10-CM | POA: Insufficient documentation

## 2013-02-16 DIAGNOSIS — F332 Major depressive disorder, recurrent severe without psychotic features: Secondary | ICD-10-CM | POA: Insufficient documentation

## 2013-02-16 DIAGNOSIS — I10 Essential (primary) hypertension: Secondary | ICD-10-CM | POA: Insufficient documentation

## 2013-02-16 DIAGNOSIS — R51 Headache: Secondary | ICD-10-CM | POA: Insufficient documentation

## 2013-02-16 DIAGNOSIS — Z872 Personal history of diseases of the skin and subcutaneous tissue: Secondary | ICD-10-CM | POA: Insufficient documentation

## 2013-02-16 DIAGNOSIS — Z8679 Personal history of other diseases of the circulatory system: Secondary | ICD-10-CM | POA: Insufficient documentation

## 2013-02-16 DIAGNOSIS — Z87440 Personal history of urinary (tract) infections: Secondary | ICD-10-CM | POA: Insufficient documentation

## 2013-02-16 DIAGNOSIS — R05 Cough: Secondary | ICD-10-CM | POA: Insufficient documentation

## 2013-02-16 DIAGNOSIS — E119 Type 2 diabetes mellitus without complications: Secondary | ICD-10-CM | POA: Insufficient documentation

## 2013-02-16 DIAGNOSIS — R197 Diarrhea, unspecified: Secondary | ICD-10-CM | POA: Insufficient documentation

## 2013-02-16 DIAGNOSIS — I5020 Unspecified systolic (congestive) heart failure: Secondary | ICD-10-CM | POA: Insufficient documentation

## 2013-02-16 DIAGNOSIS — Z8739 Personal history of other diseases of the musculoskeletal system and connective tissue: Secondary | ICD-10-CM | POA: Insufficient documentation

## 2013-02-16 DIAGNOSIS — R059 Cough, unspecified: Secondary | ICD-10-CM | POA: Insufficient documentation

## 2013-02-16 LAB — BASIC METABOLIC PANEL
BUN: 29 mg/dL — ABNORMAL HIGH (ref 6–23)
CO2: 26 mEq/L (ref 19–32)
Calcium: 9.1 mg/dL (ref 8.4–10.5)
Chloride: 105 mEq/L (ref 96–112)
Creatinine, Ser: 1.07 mg/dL (ref 0.50–1.35)
GFR calc Af Amer: 84 mL/min — ABNORMAL LOW (ref >90)
GFR calc non Af Amer: 72 mL/min — ABNORMAL LOW (ref >90)
Glucose, Bld: 76 mg/dL (ref 70–99)
Potassium: 5 mEq/L (ref 3.5–5.1)
Sodium: 138 mEq/L (ref 135–145)

## 2013-02-16 LAB — CBC WITH DIFFERENTIAL/PLATELET
Basophils Absolute: 0 10*3/uL (ref 0.0–0.1)
Basophils Relative: 1 % (ref 0–1)
Eosinophils Absolute: 0.1 10*3/uL (ref 0.0–0.7)
Eosinophils Relative: 2 % (ref 0–5)
HCT: 36.3 % — ABNORMAL LOW (ref 39.0–52.0)
Hemoglobin: 11.5 g/dL — ABNORMAL LOW (ref 13.0–17.0)
Lymphocytes Relative: 15 % (ref 12–46)
Lymphs Abs: 0.7 10*3/uL (ref 0.7–4.0)
MCH: 27.8 pg (ref 26.0–34.0)
MCHC: 31.7 g/dL (ref 30.0–36.0)
MCV: 87.7 fL (ref 78.0–100.0)
Monocytes Absolute: 0.6 10*3/uL (ref 0.1–1.0)
Monocytes Relative: 13 % — ABNORMAL HIGH (ref 3–12)
Neutro Abs: 3.2 10*3/uL (ref 1.7–7.7)
Neutrophils Relative %: 69 % (ref 43–77)
Platelets: 141 10*3/uL — ABNORMAL LOW (ref 150–400)
RBC: 4.14 MIL/uL — ABNORMAL LOW (ref 4.22–5.81)
RDW: 20.3 % — ABNORMAL HIGH (ref 11.5–15.5)
WBC: 4.6 10*3/uL (ref 4.0–10.5)

## 2013-02-16 LAB — URINALYSIS, ROUTINE W REFLEX MICROSCOPIC
Bilirubin Urine: NEGATIVE
Glucose, UA: NEGATIVE mg/dL
Hgb urine dipstick: NEGATIVE
Ketones, ur: NEGATIVE mg/dL
Leukocytes, UA: NEGATIVE
Nitrite: NEGATIVE
Protein, ur: NEGATIVE mg/dL
Specific Gravity, Urine: 1.02 (ref 1.005–1.030)
Urobilinogen, UA: 0.2 mg/dL (ref 0.0–1.0)
pH: 5.5 (ref 5.0–8.0)

## 2013-02-16 LAB — TROPONIN I: Troponin I: <0.3 ng/mL (ref <0.30)

## 2013-02-16 MED ORDER — SODIUM CHLORIDE 0.9 % IV BOLUS (SEPSIS)
1000.0000 mL | Freq: Once | INTRAVENOUS | Status: AC
  Administered 2013-02-16: 1000 mL via INTRAVENOUS

## 2013-02-16 MED ORDER — SODIUM CHLORIDE 0.9 % IV BOLUS (SEPSIS): 1000.0000 mL | Freq: Once | INTRAVENOUS | Status: AC

## 2013-02-16 MED ORDER — BUPROPION HCL ER (SR) 150 MG PO TB12: 150.0000 mg | ORAL_TABLET | Freq: Every day | ORAL | Status: DC

## 2013-02-16 NOTE — ED Notes (Addendum)
Nausea earlier but denies at present, denies vomiting, c/o HA, denies any other pain at this time, denies any SOB

## 2013-02-16 NOTE — ED Notes (Signed)
MD at bedside. 

## 2013-02-16 NOTE — ED Notes (Signed)
Reports increased dizziness starting today.  States headache started after EMS arrival to home with nausea.  EMS reports a-fib on monitor and hypotension with bp 83/48.  Given NS bolus of in route.  Pt alert and oriented x 4 at this time.  Denies weakness.
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
## 2013-02-17 ENCOUNTER — Inpatient Hospital Stay (HOSPITAL_COMMUNITY)
Admission: EM | Admit: 2013-02-17 | Discharge: 2013-02-22 | DRG: 543 | Disposition: A | Discharge status: Home / Self Care | Payer: BC Managed Care – PPO | Attending: Internal Medicine | Admitting: Internal Medicine

## 2013-02-17 ENCOUNTER — Emergency Department (HOSPITAL_COMMUNITY): Payer: BC Managed Care – PPO

## 2013-02-17 ENCOUNTER — Encounter (HOSPITAL_COMMUNITY): Payer: Self-pay | Admitting: Emergency Medicine

## 2013-02-17 ENCOUNTER — Encounter (HOSPITAL_COMMUNITY): Payer: BC Managed Care – PPO

## 2013-02-17 DIAGNOSIS — E119 Type 2 diabetes mellitus without complications: Secondary | ICD-10-CM

## 2013-02-17 DIAGNOSIS — R51 Headache: Secondary | ICD-10-CM | POA: Diagnosis present

## 2013-02-17 DIAGNOSIS — G909 Disorder of the autonomic nervous system, unspecified: Secondary | ICD-10-CM | POA: Diagnosis present

## 2013-02-17 DIAGNOSIS — L03119 Cellulitis of unspecified part of limb: Secondary | ICD-10-CM

## 2013-02-17 DIAGNOSIS — R000 Tachycardia, unspecified: Secondary | ICD-10-CM

## 2013-02-17 DIAGNOSIS — I5022 Chronic systolic (congestive) heart failure: Secondary | ICD-10-CM

## 2013-02-17 DIAGNOSIS — Z91199 Patient's noncompliance with other medical treatment and regimen due to unspecified reason: Secondary | ICD-10-CM

## 2013-02-17 DIAGNOSIS — Z6837 Body mass index (BMI) 37.0-37.9, adult: Secondary | ICD-10-CM

## 2013-02-17 DIAGNOSIS — I2510 Atherosclerotic heart disease of native coronary artery without angina pectoris: Secondary | ICD-10-CM | POA: Diagnosis present

## 2013-02-17 DIAGNOSIS — F332 Major depressive disorder, recurrent severe without psychotic features: Secondary | ICD-10-CM | POA: Diagnosis present

## 2013-02-17 DIAGNOSIS — K3184 Gastroparesis: Secondary | ICD-10-CM | POA: Diagnosis present

## 2013-02-17 DIAGNOSIS — I4891 Unspecified atrial fibrillation: Secondary | ICD-10-CM | POA: Diagnosis present

## 2013-02-17 DIAGNOSIS — I959 Hypotension, unspecified: Secondary | ICD-10-CM

## 2013-02-17 DIAGNOSIS — I5021 Acute systolic (congestive) heart failure: Secondary | ICD-10-CM

## 2013-02-17 DIAGNOSIS — I38 Endocarditis, valve unspecified: Secondary | ICD-10-CM

## 2013-02-17 DIAGNOSIS — I872 Venous insufficiency (chronic) (peripheral): Secondary | ICD-10-CM | POA: Diagnosis present

## 2013-02-17 DIAGNOSIS — I10 Essential (primary) hypertension: Secondary | ICD-10-CM

## 2013-02-17 DIAGNOSIS — E118 Type 2 diabetes mellitus with unspecified complications: Secondary | ICD-10-CM | POA: Diagnosis present

## 2013-02-17 DIAGNOSIS — I498 Other specified cardiac arrhythmias: Secondary | ICD-10-CM | POA: Diagnosis present

## 2013-02-17 DIAGNOSIS — F329 Major depressive disorder, single episode, unspecified: Secondary | ICD-10-CM

## 2013-02-17 DIAGNOSIS — E1149 Type 2 diabetes mellitus with other diabetic neurological complication: Secondary | ICD-10-CM | POA: Diagnosis present

## 2013-02-17 DIAGNOSIS — D61818 Other pancytopenia: Secondary | ICD-10-CM

## 2013-02-17 DIAGNOSIS — E1143 Type 2 diabetes mellitus with diabetic autonomic (poly)neuropathy: Secondary | ICD-10-CM

## 2013-02-17 DIAGNOSIS — I447 Left bundle-branch block, unspecified: Secondary | ICD-10-CM | POA: Diagnosis present

## 2013-02-17 DIAGNOSIS — I951 Orthostatic hypotension: Secondary | ICD-10-CM

## 2013-02-17 DIAGNOSIS — T502X5A Adverse effect of carbonic-anhydrase inhibitors, benzothiadiazides and other diuretics, initial encounter: Secondary | ICD-10-CM | POA: Diagnosis present

## 2013-02-17 DIAGNOSIS — R0789 Other chest pain: Secondary | ICD-10-CM

## 2013-02-17 DIAGNOSIS — E861 Hypovolemia: Secondary | ICD-10-CM | POA: Diagnosis present

## 2013-02-17 DIAGNOSIS — I509 Heart failure, unspecified: Secondary | ICD-10-CM | POA: Diagnosis present

## 2013-02-17 DIAGNOSIS — Z87891 Personal history of nicotine dependence: Secondary | ICD-10-CM

## 2013-02-17 DIAGNOSIS — F331 Major depressive disorder, recurrent, moderate: Secondary | ICD-10-CM

## 2013-02-17 DIAGNOSIS — Z952 Presence of prosthetic heart valve: Secondary | ICD-10-CM

## 2013-02-17 DIAGNOSIS — I5031 Acute diastolic (congestive) heart failure: Secondary | ICD-10-CM

## 2013-02-17 DIAGNOSIS — F32A Depression, unspecified: Secondary | ICD-10-CM

## 2013-02-17 DIAGNOSIS — Z9119 Patient's noncompliance with other medical treatment and regimen: Secondary | ICD-10-CM

## 2013-02-17 DIAGNOSIS — G4733 Obstructive sleep apnea (adult) (pediatric): Secondary | ICD-10-CM

## 2013-02-17 DIAGNOSIS — Z79899 Other long term (current) drug therapy: Secondary | ICD-10-CM

## 2013-02-17 DIAGNOSIS — I428 Other cardiomyopathies: Secondary | ICD-10-CM | POA: Diagnosis present

## 2013-02-17 DIAGNOSIS — M1990 Unspecified osteoarthritis, unspecified site: Secondary | ICD-10-CM | POA: Diagnosis present

## 2013-02-17 DIAGNOSIS — J069 Acute upper respiratory infection, unspecified: Secondary | ICD-10-CM

## 2013-02-17 HISTORY — DX: Supraventricular tachycardia: I47.1

## 2013-02-17 HISTORY — DX: Chronic systolic (congestive) heart failure: I50.22

## 2013-02-17 HISTORY — DX: Atherosclerotic heart disease of native coronary artery without angina pectoris: I25.10

## 2013-02-17 HISTORY — DX: Essential (primary) hypertension: I10

## 2013-02-17 HISTORY — DX: Supraventricular tachycardia, unspecified: I47.10

## 2013-02-17 LAB — BASIC METABOLIC PANEL
BUN: 23 mg/dL (ref 6–23)
CO2: 25 mEq/L (ref 19–32)
Calcium: 8.9 mg/dL (ref 8.4–10.5)
Chloride: 104 mEq/L (ref 96–112)
Creatinine, Ser: 1.19 mg/dL (ref 0.50–1.35)
GFR calc Af Amer: 74 mL/min — ABNORMAL LOW (ref >90)
GFR calc non Af Amer: 64 mL/min — ABNORMAL LOW (ref >90)
Glucose, Bld: 95 mg/dL (ref 70–99)
Potassium: 4.8 mEq/L (ref 3.5–5.1)
Sodium: 136 mEq/L (ref 135–145)

## 2013-02-17 LAB — CBC WITH DIFFERENTIAL/PLATELET
Basophils Absolute: 0 10*3/uL (ref 0.0–0.1)
Basophils Relative: 1 % (ref 0–1)
Eosinophils Absolute: 0.1 10*3/uL (ref 0.0–0.7)
Eosinophils Relative: 3 % (ref 0–5)
HCT: 31.9 % — ABNORMAL LOW (ref 39.0–52.0)
Hemoglobin: 10.2 g/dL — ABNORMAL LOW (ref 13.0–17.0)
Lymphocytes Relative: 16 % (ref 12–46)
Lymphs Abs: 0.6 10*3/uL — ABNORMAL LOW (ref 0.7–4.0)
MCH: 28.1 pg (ref 26.0–34.0)
MCHC: 32 g/dL (ref 30.0–36.0)
MCV: 87.9 fL (ref 78.0–100.0)
Monocytes Absolute: 0.5 10*3/uL (ref 0.1–1.0)
Monocytes Relative: 13 % — ABNORMAL HIGH (ref 3–12)
Neutro Abs: 2.6 10*3/uL (ref 1.7–7.7)
Neutrophils Relative %: 67 % (ref 43–77)
Platelets: 118 10*3/uL — ABNORMAL LOW (ref 150–400)
RBC: 3.63 MIL/uL — ABNORMAL LOW (ref 4.22–5.81)
RDW: 20.2 % — ABNORMAL HIGH (ref 11.5–15.5)
WBC Morphology: INCREASED
WBC: 3.8 10*3/uL — ABNORMAL LOW (ref 4.0–10.5)

## 2013-02-17 LAB — TROPONIN I: Troponin I: <0.3 ng/mL (ref <0.30)

## 2013-02-17 LAB — GLUCOSE, CAPILLARY: Glucose-Capillary: 130 mg/dL — ABNORMAL HIGH (ref 70–99)

## 2013-02-17 LAB — PRO B NATRIURETIC PEPTIDE: Pro B Natriuretic peptide (BNP): 4611 pg/mL — ABNORMAL HIGH (ref 0–125)

## 2013-02-17 MED ORDER — NITROGLYCERIN 0.4 MG SL SUBL: 0.4000 mg | SUBLINGUAL_TABLET | SUBLINGUAL | Status: DC | PRN

## 2013-02-17 MED ORDER — ENOXAPARIN SODIUM 40 MG/0.4ML ~~LOC~~ SOLN
40.0000 mg | SUBCUTANEOUS | Status: DC
  Administered 2013-02-17 – 2013-02-21 (×5): 40 mg via SUBCUTANEOUS
  Filled 2013-02-17 (×6): qty 0.4

## 2013-02-17 MED ORDER — ACETAMINOPHEN 650 MG RE SUPP: 650.0000 mg | Freq: Four times a day (QID) | RECTAL | Status: DC | PRN

## 2013-02-17 MED ORDER — SODIUM CHLORIDE 0.9 % IV BOLUS (SEPSIS)
1000.0000 mL | Freq: Once | INTRAVENOUS | Status: AC
  Administered 2013-02-17: 1000 mL via INTRAVENOUS

## 2013-02-17 MED ORDER — ASPIRIN 81 MG PO CHEW
81.0000 mg | CHEWABLE_TABLET | Freq: Every day | ORAL | Status: DC
  Administered 2013-02-18 – 2013-02-21 (×4): 81 mg via ORAL
  Filled 2013-02-17 (×5): qty 1

## 2013-02-17 MED ORDER — METOCLOPRAMIDE HCL 10 MG PO TABS
10.0000 mg | ORAL_TABLET | Freq: Three times a day (TID) | ORAL | Status: DC
  Administered 2013-02-17 – 2013-02-22 (×18): 10 mg via ORAL
  Filled 2013-02-17 (×18): qty 1

## 2013-02-17 MED ORDER — ASPIRIN 81 MG PO TABS: 81.0000 mg | ORAL_TABLET | Freq: Every day | ORAL | Status: DC

## 2013-02-17 MED ORDER — ONDANSETRON HCL 4 MG PO TABS: 4.0000 mg | ORAL_TABLET | Freq: Four times a day (QID) | ORAL | Status: DC | PRN

## 2013-02-17 MED ORDER — ACETAMINOPHEN 325 MG PO TABS
650.0000 mg | ORAL_TABLET | Freq: Four times a day (QID) | ORAL | Status: DC | PRN
  Administered 2013-02-19 – 2013-02-22 (×3): 650 mg via ORAL
  Filled 2013-02-17 (×3): qty 2

## 2013-02-17 MED ORDER — SODIUM CHLORIDE 0.9 % IJ SOLN
3.0000 mL | Freq: Two times a day (BID) | INTRAMUSCULAR | Status: DC
  Administered 2013-02-18 – 2013-02-21 (×4): 3 mL via INTRAVENOUS

## 2013-02-17 MED ORDER — ADULT MULTIVITAMIN W/MINERALS CH
1.0000 | ORAL_TABLET | Freq: Every day | ORAL | Status: DC
  Administered 2013-02-18 – 2013-02-21 (×4): 1 via ORAL
  Filled 2013-02-17 (×6): qty 1

## 2013-02-17 MED ORDER — INSULIN ASPART 100 UNIT/ML ~~LOC~~ SOLN
0.0000 [IU] | Freq: Three times a day (TID) | SUBCUTANEOUS | Status: DC
  Administered 2013-02-18 – 2013-02-19 (×2): 1 [IU] via SUBCUTANEOUS
  Administered 2013-02-20: 3 [IU] via SUBCUTANEOUS
  Administered 2013-02-20: 1 [IU] via SUBCUTANEOUS

## 2013-02-17 MED ORDER — CITALOPRAM HYDROBROMIDE 20 MG PO TABS
20.0000 mg | ORAL_TABLET | Freq: Every day | ORAL | Status: DC
  Administered 2013-02-18 – 2013-02-21 (×4): 20 mg via ORAL
  Filled 2013-02-17 (×5): qty 1

## 2013-02-17 MED ORDER — ALBUTEROL SULFATE (5 MG/ML) 0.5% IN NEBU: 2.5000 mg | INHALATION_SOLUTION | RESPIRATORY_TRACT | Status: DC | PRN

## 2013-02-17 MED ORDER — SODIUM CHLORIDE 0.9 % IV SOLN
INTRAVENOUS | Status: AC
  Administered 2013-02-17: 18:00:00 via INTRAVENOUS

## 2013-02-17 MED ORDER — INSULIN ASPART 100 UNIT/ML ~~LOC~~ SOLN: 0.0000 [IU] | Freq: Every day | SUBCUTANEOUS | Status: DC

## 2013-02-17 MED ORDER — CARVEDILOL 3.125 MG PO TABS
9.7500 mg | ORAL_TABLET | Freq: Two times a day (BID) | ORAL | Status: DC
  Administered 2013-02-17 – 2013-02-18 (×2): 9.375 mg via ORAL
  Filled 2013-02-17: qty 2
  Filled 2013-02-17: qty 1
  Filled 2013-02-17: qty 3

## 2013-02-17 MED ORDER — BUPROPION HCL ER (SR) 150 MG PO TB12
150.0000 mg | ORAL_TABLET | Freq: Every day | ORAL | Status: DC
  Administered 2013-02-18 – 2013-02-21 (×4): 150 mg via ORAL
  Filled 2013-02-17 (×8): qty 1

## 2013-02-17 MED ORDER — TRAZODONE HCL 50 MG PO TABS
150.0000 mg | ORAL_TABLET | Freq: Every day | ORAL | Status: DC
  Administered 2013-02-17 – 2013-02-21 (×5): 150 mg via ORAL
  Filled 2013-02-17 (×5): qty 3

## 2013-02-17 MED ORDER — ONDANSETRON HCL 4 MG/2ML IJ SOLN: 4.0000 mg | Freq: Four times a day (QID) | INTRAMUSCULAR | Status: DC | PRN
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
## 2013-02-17 NOTE — ED Notes (Signed)
Report given to Jessica, RN. Ready to receive patient. 

## 2013-02-17 NOTE — ED Notes (Signed)
Patient arrives via EMS from home with c/o dizziness, headache and hypotension. Seen for same yesterday. No change per patient. Alert/oreinted x 4.
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
## 2013-02-17 NOTE — ED Notes (Signed)
Admitting MD at bedside.

## 2013-02-18 ENCOUNTER — Encounter (HOSPITAL_COMMUNITY): Payer: Self-pay | Admitting: Cardiology

## 2013-02-18 DIAGNOSIS — F329 Major depressive disorder, single episode, unspecified: Secondary | ICD-10-CM

## 2013-02-18 DIAGNOSIS — Z954 Presence of other heart-valve replacement: Secondary | ICD-10-CM

## 2013-02-18 LAB — BASIC METABOLIC PANEL
BUN: 21 mg/dL (ref 6–23)
CO2: 24 mEq/L (ref 19–32)
Calcium: 8.6 mg/dL (ref 8.4–10.5)
Chloride: 106 mEq/L (ref 96–112)
Creatinine, Ser: 1.04 mg/dL (ref 0.50–1.35)
GFR calc Af Amer: 87 mL/min — ABNORMAL LOW (ref >90)
GFR calc non Af Amer: 75 mL/min — ABNORMAL LOW (ref >90)
Glucose, Bld: 121 mg/dL — ABNORMAL HIGH (ref 70–99)
Potassium: 4.9 mEq/L (ref 3.5–5.1)
Sodium: 137 mEq/L (ref 135–145)

## 2013-02-18 LAB — GLUCOSE, CAPILLARY
Glucose-Capillary: 106 mg/dL — ABNORMAL HIGH (ref 70–99)
Glucose-Capillary: 133 mg/dL — ABNORMAL HIGH (ref 70–99)
Glucose-Capillary: 99 mg/dL (ref 70–99)
Glucose-Capillary: 118 mg/dL — ABNORMAL HIGH (ref 70–99)

## 2013-02-18 LAB — CBC
HCT: 30.9 % — ABNORMAL LOW (ref 39.0–52.0)
Hemoglobin: 9.7 g/dL — ABNORMAL LOW (ref 13.0–17.0)
MCH: 27.7 pg (ref 26.0–34.0)
MCHC: 31.4 g/dL (ref 30.0–36.0)
MCV: 88.3 fL (ref 78.0–100.0)
Platelets: 102 10*3/uL — ABNORMAL LOW (ref 150–400)
RBC: 3.5 MIL/uL — ABNORMAL LOW (ref 4.22–5.81)
RDW: 20.6 % — ABNORMAL HIGH (ref 11.5–15.5)
WBC: 4.6 10*3/uL (ref 4.0–10.5)

## 2013-02-18 LAB — HEPATIC FUNCTION PANEL
ALT: 11 U/L (ref 0–53)
AST: 13 U/L (ref 0–37)
Albumin: 3 g/dL — ABNORMAL LOW (ref 3.5–5.2)
Alkaline Phosphatase: 59 U/L (ref 39–117)
Bilirubin, Direct: 0.1 mg/dL (ref 0.0–0.3)
Indirect Bilirubin: 0.2 mg/dL — ABNORMAL LOW (ref 0.3–0.9)
Total Bilirubin: 0.3 mg/dL (ref 0.3–1.2)
Total Protein: 5.7 g/dL — ABNORMAL LOW (ref 6.0–8.3)

## 2013-02-18 LAB — PRO B NATRIURETIC PEPTIDE: Pro B Natriuretic peptide (BNP): 1873 pg/mL — ABNORMAL HIGH (ref 0–125)

## 2013-02-18 LAB — CORTISOL: Cortisol, Plasma: 6.1 ug/dL

## 2013-02-18 MED ORDER — CARVEDILOL 3.125 MG PO TABS
3.1250 mg | ORAL_TABLET | Freq: Two times a day (BID) | ORAL | Status: DC
  Administered 2013-02-18 – 2013-02-22 (×8): 3.125 mg via ORAL
  Filled 2013-02-18 (×8): qty 1

## 2013-02-18 NOTE — Progress Notes (Signed)
Paged Dr. Waymon Amato regarding patient's IVF order expiring.  No new orders received at this time.  Will change IVF to [facility] until further orders are entered.

## 2013-02-18 NOTE — Progress Notes (Signed)
UR Chart Review Completed  

## 2013-02-18 NOTE — Progress Notes (Signed)
[facility] PROGRESS NOTE  Cody Hale YNW:[id number] DOB: October 15, 1950 DOA: 02/17/2013 PCP: Sanda Linger, MD Primary cardiologist: Dr. Rollene Rotunda  Brief narrative 62 y.o. 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 He is being seen in the ED several times, given IV fluids and cut back on diuretic doses. He was again admitted on 02/17/13 for symptomatic hypotension.  Assessment/Plan: 1. Recurrent symptomatic hypotension: Most likely due to hypovolemia from diuresis, beta blockers and? Autonomic neuropathy. Cardiology consultation appreciated. Patient apparently takes Coreg 12.5 mg only once in the morning and feels poorly a few hours after taking this medication. He was treated with IV fluids overnight and his blood pressures have improved. Cardiology has recommended reducing his Coreg to 3.125 mg twice a day and monitor. Diuretics currently on hold. Continue lower extremity compression stockings. 2. Pancytopenia: Unclear etiology. WBC normal. Hemoglobin stable. Platelets slightly low. Follow CBC in a.m. 3. Type II DM: Oral medications on hold. Continue SSI. Good inpatient control. 4. Chronic systolic CHF: Clinically compensated. Diuretics on hold. 5. Maj. depression: Required hospitalization in June 2014. Currently stable. Continue home medications. 6. History of bioprosthetic mitral valve replacement: 7. History of atrial fibrillation: Currently in sinus bradycardia. Not on anticoagulants. 8. History of  OSA  Code Status: Full Family Communication: None Disposition Plan: Continue medical management. Home when medically stable.   Consultants:  [facility] cardiology  Procedures:  None  Antibiotics:  None   HPI/Subjective: Dizziness and lightheadedness have significantly improved.  Objective: Filed Vitals:   02/18/13 0903 02/18/13 0904 02/18/13 0905 02/18/13 1302  BP: 89/53 119/64 102/61 99/56  Pulse: 59 67  55  Temp:    97.9 F (36.6 C)  TempSrc:    Oral  Resp: 18   17  Height:      Weight:      SpO2: 95% 96% 99% 96%    Intake/Output Summary (Last 24 hours) at 02/18/13 1443 Last data filed at 02/18/13 1221  Gross per 24 hour  Intake    753 ml  Output   1775 ml  Net  -1022 ml   Filed Weights   02/17/13 1717 02/18/13 0614 02/18/13 0627  Weight: 111.5 kg (245 lb 13 oz) 120.4 kg (265 lb 6.9 oz) 119.3 kg (263 lb 0.1 oz)    Exam:   General exam: Comfortable. Obese.  Respiratory system: Clear. No increased work of breathing.  Cardiovascular system: S1 & S2 heard, RRR. No JVD, murmurs, gallops, clicks or pedal edema. Bilateral lower extremity compression stockings. Telemetry: Mostly sinus bradycardia in the 50s but had occasions of sinus bradycardia in the 40s at midnight.  Gastrointestinal system: Abdomen is nondistended, soft and nontender. Normal bowel sounds heard.  Central nervous system: Alert and oriented. No focal neurological deficits.  Extremities: Symmetric 5 x 5 power.  Psychiatry: Pleasant, appropriate and establishes eye contact.   Data Reviewed: Basic Metabolic Panel:  Recent Labs Lab 02/16/13 1306 02/17/13 1418 02/18/13 0518  NA 138 136 137  K 5.0 4.8 4.9  CL 105 104 106  CO2 26 25 24   GLUCOSE 76 95 121*  BUN 29*  23 21  CREATININE 1.07 1.19 1.04  CALCIUM 9.1 8.9 8.6   Liver Function Tests:  Recent Labs Lab 02/18/13 0518  AST 13  ALT 11  ALKPHOS 59  BILITOT 0.3  PROT 5.7*  ALBUMIN 3.0*   No results found for this  basename: LIPASE, AMYLASE,  in the last 168 hours No results found for this basename: AMMONIA,  in the last 168 hours CBC:  Recent Labs Lab 02/16/13 1306 02/17/13 1418 02/18/13 0518  WBC 4.6 3.8* 4.6  NEUTROABS 3.2 2.6  --   HGB 11.5* 10.2* 9.7*  HCT 36.3* 31.9* 30.9*  MCV 87.7 87.9 88.3  PLT 141* 118* 102*   Cardiac Enzymes:  Recent Labs Lab 02/16/13 1306 02/17/13 1418  TROPONINI <0.30 <0.30   BNP (last 3 results)  Recent Labs  12/24/12 1636 02/17/13 1418 02/18/13 0518  PROBNP 878.5* 4611.0* 1873.0*   CBG:  Recent Labs Lab 02/17/13 2120 02/18/13 0713 02/18/13 1120  GLUCAP 130* 106* 133*    No results found for this or any previous visit (from the past 240 hour(s)).   Studies: Dg Chest Port 1 View  02/17/2013   *RADIOLOGY REPORT*  Clinical Data: Hypotension  PORTABLE CHEST - 1 VIEW  Comparison: Portable chest x-ray of 02/16/2013  Findings: The lungs are not quite as well aerated.  Moderate cardiomegaly is stable.  There may be minimal pulmonary vascular congestion remaining.  No bony abnormality is seen.  IMPRESSION: Stable cardiomegaly.  Slightly diminished aeration.  Question minimal pulmonary vascular congestion.   Original Report Authenticated By: Dwyane Dee, M.D.     Additional labs:   Scheduled Meds: . aspirin  81 mg Oral Daily  . buPROPion  150 mg Oral Daily  . carvedilol  3.125 mg Oral BID WC  . citalopram  20 mg Oral Daily  . enoxaparin (LOVENOX) injection  40 mg Subcutaneous Q24H  . insulin aspart  0-5 Units Subcutaneous QHS  . insulin aspart  0-9 Units Subcutaneous TID WC  . metoCLOPramide  10 mg Oral TID AC & HS  . multivitamin with minerals  1 tablet Oral Daily  . sodium chloride  3 mL Intravenous Q12H  . traZODone  150 mg Oral QHS   Continuous Infusions:    Principal Problem:   Hypotension Active Problems:   Depression   OSA (obstructive sleep apnea)   Diabetes mellitus, type II   Hypertension   H/O mitral valve replacement    Chronic systolic CHF (congestive heart failure)   Pancytopenia    Time spent: 30 minutes    [facility]  [facility] Pager [phone].   If 8PM-8AM, please contact night-coverage at [email or web address], password [facility] 02/18/2013, 2:43 PM  LOS: 1 day
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
## 2013-02-19 ENCOUNTER — Encounter (HOSPITAL_COMMUNITY): Payer: BC Managed Care – PPO

## 2013-02-19 DIAGNOSIS — R000 Tachycardia, unspecified: Secondary | ICD-10-CM

## 2013-02-19 LAB — GLUCOSE, CAPILLARY
Glucose-Capillary: 126 mg/dL — ABNORMAL HIGH (ref 70–99)
Glucose-Capillary: 111 mg/dL — ABNORMAL HIGH (ref 70–99)
Glucose-Capillary: 120 mg/dL — ABNORMAL HIGH (ref 70–99)
Glucose-Capillary: 88 mg/dL (ref 70–99)

## 2013-02-19 LAB — CBC
HCT: 33 % — ABNORMAL LOW (ref 39.0–52.0)
Hemoglobin: 10.4 g/dL — ABNORMAL LOW (ref 13.0–17.0)
MCH: 27.7 pg (ref 26.0–34.0)
MCHC: 31.5 g/dL (ref 30.0–36.0)
MCV: 88 fL (ref 78.0–100.0)
Platelets: 114 10*3/uL — ABNORMAL LOW (ref 150–400)
RBC: 3.75 MIL/uL — ABNORMAL LOW (ref 4.22–5.81)
RDW: 20.2 % — ABNORMAL HIGH (ref 11.5–15.5)
WBC: 3.6 10*3/uL — ABNORMAL LOW (ref 4.0–10.5)

## 2013-02-19 MED ORDER — FUROSEMIDE 10 MG/ML IJ SOLN
20.0000 mg | Freq: Once | INTRAMUSCULAR | Status: AC
  Administered 2013-02-19: 20 mg via INTRAVENOUS
  Filled 2013-02-19: qty 2

## 2013-02-19 NOTE — Progress Notes (Deleted)
Called nurse to get report not available at this time Lake Bells RN,to call back.

## 2013-02-19 NOTE — Progress Notes (Signed)
Pharmacist Heart Failure Core Measure Documentation  Assessment: Cody Hale has an EF documented as < 40% on 11/2012 by echo.  Rationale: Heart failure patients with left ventricular systolic dysfunction (LVSD) and an EF < 40% should be prescribed an angiotensin converting enzyme inhibitor (ACEI) or angiotensin receptor blocker (ARB) at discharge unless a contraindication is documented in the medical record.  This patient is not currently on an ACEI or ARB for HF.  This note is being placed in the record in order to provide documentation that a contraindication to the use of these agents is present for this encounter.  ACE Inhibitor or Angiotensin Receptor Blocker is contraindicated (specify all that apply)  []   ACEI allergy AND ARB allergy []   Angioedema []   Moderate or severe aortic stenosis []   Hyperkalemia [x]   Hypotension []   Renal artery stenosis []   Worsening renal function, preexisting renal disease or dysfunction   Valrie Hart A 02/19/2013 11:17 AM

## 2013-02-19 NOTE — Care Management Note (Signed)
    Page 1 of 1   02/22/2013     3:55:06 PM   CARE MANAGEMENT NOTE 02/22/2013  Patient:  DOHN, STCLAIR   Account Number:  [id number]  Date Initiated:  02/19/2013  Documentation initiated by:  Rosemary Holms  Subjective/Objective Assessment:   Pt admitted from home with spouse. Plans to DC back home. No anticipated [facility] DME needs     Action/Plan:   Anticipated DC Date:  02/20/2013   Anticipated DC Plan:  HOME/SELF CARE      DC Planning Services  CM consult      Choice offered to / List presented to:             Status of service:  Completed, signed off Medicare Important Message given?   (If response is "NO", the following Medicare IM given date fields will be blank) Date Medicare IM given:   Date Additional Medicare IM given:    Discharge Disposition:  HOME/SELF CARE  Per UR Regulation:    If discussed at Long Length of Stay Meetings, dates discussed:    Comments:  02/19/13 Rosemary Holms RN BSN CM

## 2013-02-19 NOTE — Progress Notes (Signed)
[facility] PROGRESS NOTE  Cody Hale ZOX:[id number] DOB: 1951/07/07 DOA: 02/17/2013 PCP: Sanda Linger, MD Primary cardiologist: Dr. Rollene Rotunda  Brief narrative 62 y.o. 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 He is being seen in the ED several times, given IV fluids and cut back on diuretic doses. He was again admitted on 02/17/13 for symptomatic hypotension.  Assessment/Plan: 1. Recurrent symptomatic hypotension: Most likely due to hypovolemia from diuresis, beta blockers and? Autonomic neuropathy. Cardiology consultation appreciated. PTA Patient apparently took Coreg 12.5 mg only once in the morning and feels poorly a few hours after taking this medication. He was treated with IV fluids initially and his blood pressures improved. Cardiology has reduced his Coreg to 3.125 mg twice a day. Diuretics currently on hold. Continue lower extremity compression stockings. BP continue to be soft and mild orthostatic changes this morning. 2. Sinus tachycardia with bundle branch block morphology: Chronic LBBB an EKG. Patient has been in sinus tachycardia in the 120s to 130s since approximately 4 PM on 8/14. Discussed with Dr. Daleen Squibb on 8/15 recommended small dose of IV Lasix for volume overload which may be contributing. Soft blood pressures limiting up titration of beta blockers. Monitor on telemetry. Cardiology to follow. 3. Pancytopenia: Unclear etiology. WBC normal. Hemoglobin stable. Platelets slightly low.  Stable. 4. Type II DM: Oral medications on hold. Continue SSI. Good inpatient control. 5. Chronic systolic CHF: Clinically compensated. Diuretics on hold. Gave Lasix 20 mg IV x1 dose on 8/15. Monitor 6. Maj. depression: Required hospitalization in June 2014. Currently stable. Continue home medications. 7. History of bioprosthetic mitral valve replacement: 8. History of atrial fibrillation: Currently in sinus tachycardia, but could also be atrial flutter with 2 in 1 block. Continue low dose Coreg. Cardiology to follow. 9. History of OSA  Code Status: Full Family Communication: Discussed with spouse. Disposition Plan: Continue medical management. Home when medically stable.   Consultants:  [facility] cardiology  Procedures:  None  Antibiotics:  None   HPI/Subjective: States that dizziness and lightheadedness have steadily improved and "only 30% left". Denies dyspnea.  Objective: Filed Vitals:   02/19/13 1022 02/19/13 1408 02/19/13 1518 02/19/13 1707  BP: 84/62 117/63  97/59  Pulse: 131 74  70  Temp: 97.9 F (36.6 C) 97.9 F (36.6 C)  97.7 F (36.5 C)  TempSrc: Oral Oral  Oral  Resp:   17 18  Height:      Weight:      SpO2: 99% 95%  98%    Intake/Output Summary (Last 24 hours) at 02/19/13 1814 Last data filed at 02/19/13 1741  Gross per 24 hour  Intake   3302 ml  Output   4400 ml  Net  -1098 ml   Filed Weights   02/18/13 0614 02/18/13 0627 02/19/13 0449  Weight: 120.4 kg (265 lb 6.9 oz) 119.3 kg (263 lb 0.1 oz) 119.2 kg (262 lb 12.6 oz)    Exam:   General exam: Comfortable. Obese.  Respiratory system: Clear. No increased work of breathing.  Cardiovascular system: S1 & S2 heard, RRR. No JVD, murmurs, gallops, clicks or pedal  edema. Bilateral lower extremity compression stockings. Telemetry: Sinus tachycardia in the 120s-130s with bundle branch morphology since 8/14, 4:30 PM  Gastrointestinal system: Abdomen is nondistended, soft and nontender. Normal bowel  sounds heard.  Central nervous system: Alert and oriented. No focal neurological deficits.  Extremities: Symmetric 5 x 5 power.  Psychiatry: Pleasant, appropriate and establishes eye contact.   Data Reviewed: Basic Metabolic Panel:  Recent Labs Lab 02/16/13 1306 02/17/13 1418 02/18/13 0518  NA 138 136 137  K 5.0 4.8 4.9  CL 105 104 106  CO2 26 25 24   GLUCOSE 76 95 121*  BUN 29* 23 21  CREATININE 1.07 1.19 1.04  CALCIUM 9.1 8.9 8.6   Liver Function Tests:  Recent Labs Lab 02/18/13 0518  AST 13  ALT 11  ALKPHOS 59  BILITOT 0.3  PROT 5.7*  ALBUMIN 3.0*   No results found for this basename: LIPASE, AMYLASE,  in the last 168 hours No results found for this basename: AMMONIA,  in the last 168 hours CBC:  Recent Labs Lab 02/16/13 1306 02/17/13 1418 02/18/13 0518 02/19/13 0558  WBC 4.6 3.8* 4.6 3.6*  NEUTROABS 3.2 2.6  --   --   HGB 11.5* 10.2* 9.7* 10.4*  HCT 36.3* 31.9* 30.9* 33.0*  MCV 87.7 87.9 88.3 88.0  PLT 141* 118* 102* 114*   Cardiac Enzymes:  Recent Labs Lab 02/16/13 1306 02/17/13 1418  TROPONINI <0.30 <0.30   BNP (last 3 results)  Recent Labs  12/24/12 1636 02/17/13 1418 02/18/13 0518  PROBNP 878.5* 4611.0* 1873.0*   CBG:  Recent Labs Lab 02/18/13 1611 02/18/13 2108 02/19/13 0741 02/19/13 1154 02/19/13 1644  GLUCAP 99 118* 126* 111* 120*    No results found for this or any previous visit (from the past 240 hour(s)).   Studies: No results found.   Additional labs:   Scheduled Meds: . aspirin  81 mg Oral Daily  . buPROPion  150 mg Oral Daily  . carvedilol  3.125 mg Oral BID WC  . citalopram  20 mg Oral Daily  . enoxaparin (LOVENOX) injection  40 mg Subcutaneous Q24H  . insulin aspart  0-5 Units Subcutaneous QHS  . insulin aspart  0-9 Units Subcutaneous TID WC  . metoCLOPramide  10 mg Oral TID AC & HS  . multivitamin with minerals  1 tablet Oral Daily  . sodium chloride  3 mL Intravenous Q12H  . traZODone  150 mg  Oral QHS   Continuous Infusions:    Principal Problem:   Hypotension Active Problems:   Depression   OSA (obstructive sleep apnea)   Diabetes mellitus, type II   Hypertension   H/O mitral valve replacement   Chronic systolic CHF (congestive heart failure)   Pancytopenia    Time spent: 30 minutes    [facility]  [facility] Pager [phone].   If 8PM-8AM, please contact night-coverage at [email or web address], password [facility] 02/19/2013, 6:14 PM  LOS: 2 days

## 2013-02-19 NOTE — Progress Notes (Deleted)
Called nurse not available at this time Lake Bells RN,to call back.

## 2013-02-19 NOTE — Progress Notes (Signed)
Patient's B/P 84/62,hr 131,temp 97.9, room air 99, no c/o pain or discomfort,Dr wall notified. Will continue to monitor patient.Family at bedside.

## 2013-02-20 LAB — BASIC METABOLIC PANEL
BUN: 12 mg/dL (ref 6–23)
CO2: 29 mEq/L (ref 19–32)
Calcium: 9.3 mg/dL (ref 8.4–10.5)
Chloride: 104 mEq/L (ref 96–112)
Creatinine, Ser: 0.87 mg/dL (ref 0.50–1.35)
GFR calc Af Amer: >90 mL/min (ref >90)
GFR calc non Af Amer: >90 mL/min (ref >90)
Glucose, Bld: 145 mg/dL — ABNORMAL HIGH (ref 70–99)
Potassium: 5.4 mEq/L — ABNORMAL HIGH (ref 3.5–5.1)
Sodium: 138 mEq/L (ref 135–145)

## 2013-02-20 LAB — GLUCOSE, CAPILLARY
Glucose-Capillary: 128 mg/dL — ABNORMAL HIGH (ref 70–99)
Glucose-Capillary: 113 mg/dL — ABNORMAL HIGH (ref 70–99)
Glucose-Capillary: 212 mg/dL — ABNORMAL HIGH (ref 70–99)
Glucose-Capillary: 113 mg/dL — ABNORMAL HIGH (ref 70–99)

## 2013-02-20 MED ORDER — FUROSEMIDE 10 MG/ML IJ SOLN
20.0000 mg | Freq: Two times a day (BID) | INTRAMUSCULAR | Status: DC
  Administered 2013-02-20 – 2013-02-21 (×2): 20 mg via INTRAVENOUS
  Filled 2013-02-20 (×3): qty 2

## 2013-02-20 NOTE — Progress Notes (Signed)
[facility] PROGRESS NOTE  SEYON STRADER WUJ:[id number] DOB: 12/12/1950 DOA: 02/17/2013 PCP: Sanda Linger, MD  Assessment/Plan: Principal Problem:   Hypotension Active Problems:   Depression   OSA (obstructive sleep apnea)   Diabetes mellitus, type II   Hypertension   H/O mitral valve replacement   Chronic systolic CHF (congestive heart failure)   Pancytopenia   Tachycardia    Brief narrative  62 y.o. 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 He is being seen in the ED several times, given IV fluids and cut back on diuretic doses. He was again admitted on 02/17/13 for symptomatic hypotension.   Assessment/Plan:  1. Recurrent symptomatic hypotension: Most likely due to hypovolemia from diuresis, beta blockers and? Autonomic neuropathy. Cardiology consultation appreciated. PTA Patient apparently took Coreg 12.5 mg only once in the morning and feels poorly a few hours after taking this medication. He was treated with IV fluids initially and his blood pressures improved. Cardiology has reduced his Coreg to 3.125 mg twice a day. Diuretics will be restarted , Continue lower extremity compression stockings. BP continue to improve  ,will try diurising today 2. Sinus tachycardia with bundle branch block morphology: Chronic LBBB an EKG. Patient has been in sinus tachycardia in the 120s to 130s since approximately 4 PM on 8/14. Discussed with Dr. Daleen Squibb on 8/15 recommended small dose of IV Lasix for volume overload which  may be contributing. Soft blood pressures limiting up titration of beta blockers. Monitor on telemetry. Cardiology to follow. 3. Pancytopenia: Unclear etiology. WBC normal. Hemoglobin stable. Platelets slightly low. Stable. 4. Type II DM: Oral medications on hold. Continue SSI. Good inpatient control. 5. Chronic systolic CHF: Clinically compensated. Diuretics restarted .  6. Maj. depression: Required hospitalization in June 2014. Currently stable. Continue home medications. 7. History of bioprosthetic mitral valve replacement: 8. History of atrial fibrillation: Currently in sinus tachycardia, but could also be atrial flutter with 2 in 1 block. Continue low dose Coreg. Cardiology to follow. 9. History of OSA Code Status: Full  Family Communication: Discussed with spouse.  Disposition Plan: Continue medical management. Wife wants SNF, they are back together     Consultants:  [place] cardiology Procedures:  None Antibiotics:  None  HPI/Subjective:  States that dizziness and lightheadedness have steadily improved and "only 30% left". Denies dyspnea.   Objective: Filed Vitals:   02/19/13 2200 02/20/13 0500 02/20/13 0802 02/20/13 1013  BP: 99/64 144/75  132/70  Pulse: 78 46 74 62  Temp: 97.7 F (36.5 C) 97.9 F (36.6 C)    TempSrc: Oral Oral    Resp: 20 18    Height:      Weight:  118.2 kg (260 lb 9.3 oz)    SpO2: 98% 95%  94%    Intake/Output Summary (Last 24 hours) at 02/20/13 1018 Last data filed at 02/20/13 0649  Gross per 24 hour  Intake   3062 ml  Output   4650 ml  Net  -1588 ml    Exam: General exam: Comfortable. Obese.  Respiratory system: Clear. No increased work of breathing.  Cardiovascular system: S1 &  S2 heard, RRR. No JVD, murmurs, gallops, clicks or pedal edema. Bilateral lower extremity compression stockings. Telemetry: Sinus tachycardia in the 120s-130s with bundle branch morphology since 8/14, 4:30 PM  Gastrointestinal system: Abdomen is nondistended,  soft and nontender. Normal bowel sounds heard.  Central nervous system: Alert and oriented. No focal neurological deficits.  Extremities: Symmetric 5 x 5 power.  Psychiatry: Pleasant, appropriate and establishes eye contact.      Data Reviewed: Basic Metabolic Panel:  Recent Labs Lab 02/16/13 1306 02/17/13 1418 02/18/13 0518 02/20/13 0715  NA 138 136 137 138  K 5.0 4.8 4.9 5.4*  CL 105 104 106 104  CO2 26 25 24 29   GLUCOSE 76 95 121* 145*  BUN 29* 23 21 12   CREATININE 1.07 1.19 1.04 0.87  CALCIUM 9.1 8.9 8.6 9.3    Liver Function Tests:  Recent Labs Lab 02/18/13 0518  AST 13  ALT 11  ALKPHOS 59  BILITOT 0.3  PROT 5.7*  ALBUMIN 3.0*   No results found for this basename: LIPASE, AMYLASE,  in the last 168 hours No results found for this basename: AMMONIA,  in the last 168 hours  CBC:  Recent Labs Lab 02/16/13 1306 02/17/13 1418 02/18/13 0518 02/19/13 0558  WBC 4.6 3.8* 4.6 3.6*  NEUTROABS 3.2 2.6  --   --   HGB 11.5* 10.2* 9.7* 10.4*  HCT 36.3* 31.9* 30.9* 33.0*  MCV 87.7 87.9 88.3 88.0  PLT 141* 118* 102* 114*    Cardiac Enzymes:  Recent Labs Lab 02/16/13 1306 02/17/13 1418  TROPONINI <0.30 <0.30   BNP (last 3 results)  Recent Labs  12/24/12 1636 02/17/13 1418 02/18/13 0518  PROBNP 878.5* 4611.0* 1873.0*     CBG:  Recent Labs Lab 02/19/13 0741 02/19/13 1154 02/19/13 1644 02/19/13 2205 02/20/13 0720  GLUCAP 126* 111* 120* 88 128*    No results found for this or any previous visit (from the past 240 hour(s)).   Studies: Dg Chest Port 1 View  02/17/2013   *RADIOLOGY REPORT*  Clinical Data: Hypotension  PORTABLE CHEST - 1 VIEW  Comparison: Portable chest x-ray of 02/16/2013  Findings: The lungs are not quite as well aerated.  Moderate cardiomegaly is stable.  There may be minimal pulmonary vascular congestion remaining.  No bony abnormality is seen.  IMPRESSION: Stable cardiomegaly.  Slightly diminished aeration.  Question  minimal pulmonary vascular congestion.   Original Report Authenticated By: Dwyane Dee, M.D.   Dg Chest Portable 1 View  02/16/2013   *RADIOLOGY REPORT*  Clinical Data: Cough, hypotension  PORTABLE CHEST - 1 VIEW  Comparison: 12/24/2012  Findings: Cardiac enlargement with vascular congestion.  Negative for edema or effusion.  Negative for pneumonia.  IMPRESSION: Cardiac enlargement with mild vascular congestion   Original Report Authenticated By: [name], M.D.    Scheduled Meds: . aspirin  81 mg Oral Daily  . buPROPion  150 mg Oral Daily  . carvedilol  3.125 mg Oral BID WC  . citalopram  20 mg Oral Daily  . enoxaparin (LOVENOX) injection  40 mg Subcutaneous Q24H  . furosemide  20 mg Intravenous BID  . insulin aspart  0-5 Units Subcutaneous QHS  . insulin aspart  0-9 Units Subcutaneous TID WC  . metoCLOPramide  10 mg Oral TID AC & HS  . multivitamin with minerals  1 tablet Oral Daily  . sodium chloride  3 mL Intravenous Q12H  . traZODone  150 mg Oral QHS   Continuous Infusions:   Principal Problem:  Hypotension Active Problems:   Depression   OSA (obstructive sleep apnea)   Diabetes mellitus, type II   Hypertension   H/O mitral valve replacement   Chronic systolic CHF (congestive heart failure)   Pancytopenia   Tachycardia    Time spent: 40 minutes   [facility]  [facility] Pager [phone]. If 8PM-8AM, please contact night-coverage at [email or web address], password [facility] 02/20/2013, 10:18 AM  LOS: 3 days

## 2013-02-20 NOTE — Plan of Care (Signed)
Problem: Phase III Progression Outcomes Goal: Activity at appropriate level-compared to baseline (UP IN CHAIR FOR HEMODIALYSIS)  Outcome: Progressing 02/20/13 1300 patient up in room and bathroom privileges with assistance. Tolerates fairly well, c/o some general weakness and feeling "usteady" during night and early morning. Instructed to call for assistance when needing to get up and not get up on his own for safety this morning, wife was at bedside. bedalarm on for safety, call light kept within reach. Morrie Sheldon Lirio Bach,RN

## 2013-02-20 NOTE — Progress Notes (Signed)
02/20/13 1304 Patient and wife requested heart failure education this morning, especially interested in fluid restrictions for home. Wife states patient "has scale for weighing, but he doesn't always do them". Provided patient and wife with heart failure education packet, heart failure zone tool magnet and reviewed education regarding importance of daily weights, fluid restrictions if instructed per MD, when to call MD. Pt and wife also watched heart failure education video this morning. Instructed to notify nursing of any further questions/concerns. Morrie Sheldon Nemiah Kissner,RN

## 2013-02-20 NOTE — Progress Notes (Signed)
02/20/13 1643 Patient ambulated in hallway this afternoon with nurse supervision/stand-by assist. Tolerated well, ambulated from his room around nurses station and back to room. Denied dizziness or discomfort during ambulation. Assisted up to chair on return to room. Call light within reach, instructed to call for assist as needed. Earnstine Regal, RN

## 2013-02-21 DIAGNOSIS — E119 Type 2 diabetes mellitus without complications: Secondary | ICD-10-CM

## 2013-02-21 DIAGNOSIS — I5022 Chronic systolic (congestive) heart failure: Secondary | ICD-10-CM

## 2013-02-21 DIAGNOSIS — I959 Hypotension, unspecified: Secondary | ICD-10-CM

## 2013-02-21 DIAGNOSIS — I10 Essential (primary) hypertension: Secondary | ICD-10-CM

## 2013-02-21 DIAGNOSIS — I509 Heart failure, unspecified: Secondary | ICD-10-CM

## 2013-02-21 DIAGNOSIS — D61818 Other pancytopenia: Secondary | ICD-10-CM

## 2013-02-21 LAB — COMPREHENSIVE METABOLIC PANEL
ALT: 12 U/L (ref 0–53)
AST: 12 U/L (ref 0–37)
Albumin: 3.2 g/dL — ABNORMAL LOW (ref 3.5–5.2)
Alkaline Phosphatase: 73 U/L (ref 39–117)
BUN: 12 mg/dL (ref 6–23)
CO2: 29 mEq/L (ref 19–32)
Calcium: 9.1 mg/dL (ref 8.4–10.5)
Chloride: 101 mEq/L (ref 96–112)
Creatinine, Ser: 0.79 mg/dL (ref 0.50–1.35)
GFR calc Af Amer: >90 mL/min (ref >90)
GFR calc non Af Amer: >90 mL/min (ref >90)
Glucose, Bld: 126 mg/dL — ABNORMAL HIGH (ref 70–99)
Potassium: 4.6 mEq/L (ref 3.5–5.1)
Sodium: 136 mEq/L (ref 135–145)
Total Bilirubin: 0.5 mg/dL (ref 0.3–1.2)
Total Protein: 6.2 g/dL (ref 6.0–8.3)

## 2013-02-21 LAB — GLUCOSE, CAPILLARY
Glucose-Capillary: 116 mg/dL — ABNORMAL HIGH (ref 70–99)
Glucose-Capillary: 120 mg/dL — ABNORMAL HIGH (ref 70–99)
Glucose-Capillary: 112 mg/dL — ABNORMAL HIGH (ref 70–99)
Glucose-Capillary: 102 mg/dL — ABNORMAL HIGH (ref 70–99)

## 2013-02-21 MED ORDER — CARVEDILOL 3.125 MG PO TABS: 3.1250 mg | ORAL_TABLET | Freq: Two times a day (BID) | ORAL | Status: DC

## 2013-02-21 MED ORDER — MIDODRINE HCL 5 MG PO TABS
5.0000 mg | ORAL_TABLET | Freq: Three times a day (TID) | ORAL | Status: DC
  Administered 2013-02-21 – 2013-02-22 (×3): 5 mg via ORAL
  Filled 2013-02-21 (×10): qty 1

## 2013-02-21 MED ORDER — ALBUTEROL SULFATE (5 MG/ML) 0.5% IN NEBU: 2.5000 mg | INHALATION_SOLUTION | RESPIRATORY_TRACT | Status: DC | PRN

## 2013-02-21 NOTE — Progress Notes (Signed)
02/21/13 1848 patient ambulated in hallway with nursing supervision. Ambulated from his room, around nurses station and back. Tolerated well, general weakness, but denied dizziness during ambulation. Assisted back to chair, states comfortable sitting up. Instructed to call for assist as needed. PT to see patient tomorrow. Earnstine Regal, RN

## 2013-02-21 NOTE — Discharge Summary (Signed)
Cody Hale MRN: [id number] DOB/AGE: 14-Jun-1951 62 y.o.  PCP: Sanda Linger, MD   Admit date: 02/17/2013 Discharge date: 02/21/2013  Discharge Diagnoses:   Subjective : complaining of being lightheaded     Hypotension Active Problems:   Depression   OSA (obstructive sleep apnea)   Diabetes mellitus, type II   Hypertension   H/O mitral valve replacement   Chronic systolic CHF (congestive heart failure)   Pancytopenia   Tachycardia     Medication List         albuterol (5 MG/ML) 0.5% nebulizer solution  Commonly known as:  PROVENTIL  Take 0.5 mL (2.5 mg total) by nebulization every 2 (two) hours as needed for wheezing.     aspirin 81 MG tablet  Take 81 mg by mouth daily.     buPROPion 150 MG 24 hr tablet  Commonly known as:  WELLBUTRIN XL  Take 1 tablet (150 mg total) by mouth daily. For depression     buPROPion 150 MG 12 hr tablet  Commonly known as:  WELLBUTRIN SR  Take 1 tablet (150 mg total) by mouth daily.     carvedilol 3.125 MG tablet  Commonly known as:  COREG  Take 1 tablet (3.125 mg total) by mouth 2 (two) times daily with a meal.     citalopram 20 MG tablet  Commonly known as:  CELEXA  Take 1 tablet (20 mg total) by mouth daily.     FISH OIL PO  Take 1 capsule by mouth daily.     furosemide 20 MG tablet  Commonly known as:  LASIX  Take 1 tablet (20 mg total) by mouth daily.     glipiZIDE 10 MG tablet  Commonly known as:  GLUCOTROL  Take 10 mg by mouth daily.     metoCLOPramide 10 MG tablet  Commonly known as:  REGLAN  Take 1 tablet (10 mg total) by mouth 4 (four) times daily -  before meals and at bedtime. To prevent vomiting     multivitamin with minerals Tabs tablet  Take 1 tablet by mouth daily.     nitroGLYCERIN 0.4 MG SL tablet  Commonly known as:  NITROSTAT  Place 1 tablet (0.4 mg total) under the tongue every 5 (five) minutes x 3 doses as needed for chest pain.     traZODone 150 MG tablet  Commonly known as:  DESYREL   Take 150 mg by mouth at bedtime.        Discharge Condition:   Disposition: 01-Home or Self Care   Consults:   Significant Diagnostic Studies: Dg Chest Port 1 View  02/17/2013   *RADIOLOGY REPORT*  Clinical Data: Hypotension  PORTABLE CHEST - 1 VIEW  Comparison: Portable chest x-ray of 02/16/2013  Findings: The lungs are not quite as well aerated.  Moderate cardiomegaly is stable.  There may be minimal pulmonary vascular congestion remaining.  No bony abnormality is seen.  IMPRESSION: Stable cardiomegaly.  Slightly diminished aeration.  Question minimal pulmonary vascular congestion.   Original Report Authenticated By: Dwyane Dee, M.D.   Dg Chest Portable 1 View  02/16/2013   *RADIOLOGY REPORT*  Clinical Data: Cough, hypotension  PORTABLE CHEST - 1 VIEW  Comparison: 12/24/2012  Findings: Cardiac enlargement with vascular congestion.  Negative for edema or effusion.  Negative for pneumonia.  IMPRESSION: Cardiac enlargement with mild vascular congestion   Original Report Authenticated By: [name], M.D.      Microbiology: No results found for this or any  previous visit (from the past 240 hour(s)).   Labs: Results for orders placed during the hospital encounter of 02/17/13 (from the past 48 hour(s))  GLUCOSE, CAPILLARY     Status: Abnormal   Collection Time    02/19/13 11:54 AM      Result Value Range   Glucose-Capillary 111 (*) 70 - 99 mg/dL   Comment 1 Notify RN    GLUCOSE, CAPILLARY     Status: Abnormal   Collection Time    02/19/13  4:44 PM      Result Value Range   Glucose-Capillary 120 (*) 70 - 99 mg/dL   Comment 1 Notify RN    GLUCOSE, CAPILLARY     Status: None   Collection Time    02/19/13 10:05 PM      Result Value Range   Glucose-Capillary 88  70 - 99 mg/dL   Comment 1 Notify RN    BASIC METABOLIC PANEL     Status: Abnormal   Collection Time    02/20/13  7:15 AM      Result Value Range   Sodium 138  135 - 145 mEq/L   Potassium 5.4 (*) 3.5 - 5.1 mEq/L    Chloride 104  96 - 112 mEq/L   CO2 29  19 - 32 mEq/L   Glucose, Bld 145 (*) 70 - 99 mg/dL   BUN 12  6 - 23 mg/dL   Creatinine, Ser 7.82  0.50 - 1.35 mg/dL   Calcium 9.3  8.4 - 95.6 mg/dL   GFR calc non Af Amer >90  >90 mL/min   GFR calc Af Amer >90  >90 mL/min   Comment: (NOTE)     The eGFR has been calculated using the CKD EPI equation.     This calculation has not been validated in all clinical situations.     eGFR's persistently <90 mL/min signify possible Chronic Kidney     Disease.  GLUCOSE, CAPILLARY     Status: Abnormal   Collection Time    02/20/13  7:20 AM      Result Value Range   Glucose-Capillary 128 (*) 70 - 99 mg/dL   Comment 1 Notify RN    GLUCOSE, CAPILLARY     Status: Abnormal   Collection Time    02/20/13 11:50 AM      Result Value Range   Glucose-Capillary 113 (*) 70 - 99 mg/dL   Comment 1 Notify RN    GLUCOSE, CAPILLARY     Status: Abnormal   Collection Time    02/20/13  4:19 PM      Result Value Range   Glucose-Capillary 212 (*) 70 - 99 mg/dL   Comment 1 Notify RN    GLUCOSE, CAPILLARY     Status: Abnormal   Collection Time    02/20/13  8:57 PM      Result Value Range   Glucose-Capillary 113 (*) 70 - 99 mg/dL   Comment 1 Notify RN    COMPREHENSIVE METABOLIC PANEL     Status: Abnormal   Collection Time    02/21/13  6:33 AM      Result Value Range   Sodium 136  135 - 145 mEq/L   Potassium 4.6  3.5 - 5.1 mEq/L   Chloride 101  96 - 112 mEq/L   CO2 29  19 - 32 mEq/L   Glucose, Bld 126 (*) 70 - 99 mg/dL   BUN 12  6 - 23 mg/dL   Creatinine, Ser 2.13  0.50 - 1.35 mg/dL   Calcium 9.1  8.4 - 16.1 mg/dL   Total Protein 6.2  6.0 - 8.3 g/dL   Albumin 3.2 (*) 3.5 - 5.2 g/dL   AST 12  0 - 37 U/L   ALT 12  0 - 53 U/L   Alkaline Phosphatase 73  39 - 117 U/L   Total Bilirubin 0.5  0.3 - 1.2 mg/dL   GFR calc non Af Amer >90  >90 mL/min   GFR calc Af Amer >90  >90 mL/min   Comment: (NOTE)     The eGFR has been calculated using the CKD EPI equation.     This  calculation has not been validated in all clinical situations.     eGFR's persistently <90 mL/min signify possible Chronic Kidney     Disease.  GLUCOSE, CAPILLARY     Status: Abnormal   Collection Time    02/21/13  7:28 AM      Result Value Range   Glucose-Capillary 116 (*) 70 - 99 mg/dL   Comment 1 Notify RN       Brief narrative  62 y.o. 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 He is being seen in the ED several times, given IV fluids and cut back on diuretic doses. He was again admitted on 02/17/13 for symptomatic hypotension.   Assessment/Plan:  1. Recurrent symptomatic hypotension: Most likely due to hypovolemia from diuresis, beta blockers and? Autonomic neuropathy. Cardiology consultation appreciated. PTA Patient apparently took Coreg 12.5 mg only once in the morning and feels poorly a few hours after taking this medication. He was treated with IV fluids initially and his blood pressures improved. Cardiology has reduced his Coreg to 3.125 mg twice a day. Diuretics will be stopped  , Continue lower extremity compression stockings.will start patient on midodrine   2. Sinus tachycardia with bundle branch block morphology: Chronic LBBB an EKG. Patient has been in sinus tachycardia in the 120s to 130s since approximately 4 PM on 8/14. Discussed with Dr. Daleen Squibb on 8/15 . Patient not tolerating diuresis ,Soft blood pressures limiting up titration of beta blockers. Monitor on telemetry. Cardiology to  follow.   3. Pancytopenia: Unclear etiology. WBC normal. Hemoglobin stable. Platelets slightly low. Stable. 4. Type II DM: Oral medications on hold. Continue SSI. Good inpatient control. 5. Chronic systolic CHF: Clinically compensated. Diuretics discontinued due to orthostatic sx today 6. Maj. depression: Required hospitalization in June 2014. Currently stable. Continue home medications. 7. History of bioprosthetic mitral valve replacement:   8. History of atrial fibrillation: Currently in sinus tachycardia, but could also be atrial flutter with 2 in 1 block. Continue low dose Coreg. Cardiology to follow. 9. History of OSA 10.     Discharge Exam: Blood pressure 113/65, pulse 73, temperature 97.9 F (36.6 C), temperature source Oral, resp. rate 20, height 5\' 9"  (1.753 m), weight 53.116 kg (117 lb 1.6 oz), SpO2 97.00%.  General exam: Comfortable. Obese. Respiratory system: Clear. No increased work of breathing. Cardiovascular system: S1 & S2 heard, RRR. No JVD, murmurs, gallops, clicks or pedal edema. Bilateral lower extremity  compression stockings. Telemetry: Sinus tachycardia in the 120s-130s with bundle branch morphology since 8/14, 4:30 PM Gastrointestinal system: Abdomen is nondistended, soft and nontender. Normal bowel sounds heard. Central nervous system: Alert and oriented. No focal neurological deficits. Extremities: Symmetric 5 x 5 power. Psychiatry: Pleasant, appropriate and establishes eye contact         Future Appointments Provider Department Dept Phone   02/22/2013 9:30 AM [facility] Monitor 2 [facility] CARDIAC REHABILITATION [phone]   02/24/2013 9:30 AM [facility] Monitor 2 [place] CARDIAC REHABILITATION [phone]   02/25/2013 3:45 PM Rollene Rotunda, MD [facility] Main Office [place]) [phone]   02/26/2013 9:30 AM [facility] Monitor 2 [facility] CARDIAC REHABILITATION [phone]   04/21/2013 3:15 PM [name], MD [facility] Primary Care -[facility]  [phone]        Signed: Richarda Overlie 02/21/2013, 10:34 AM

## 2013-02-22 ENCOUNTER — Encounter (HOSPITAL_COMMUNITY): Payer: BC Managed Care – PPO

## 2013-02-22 LAB — GLUCOSE, CAPILLARY: Glucose-Capillary: 108 mg/dL — ABNORMAL HIGH (ref 70–99)

## 2013-02-22 MED ORDER — MIDODRINE HCL 5 MG PO TABS: 5.0000 mg | ORAL_TABLET | Freq: Three times a day (TID) | ORAL | Status: DC

## 2013-02-22 MED ORDER — FUROSEMIDE 20 MG PO TABS: 10.0000 mg | ORAL_TABLET | Freq: Every day | ORAL | Status: DC

## 2013-02-22 NOTE — Progress Notes (Signed)
Patient ID: Cody Hale, male   DOB: 12-02-1950, 62 y.o.   MRN: [id number] Chart reviewed. I discussed care with hospitalist team over the weekend. His tachycardia seems to be all sinus. This is from deconditioning, cutting back on his carvedilol, and dehydration. He looks much better this morning. I've had a long conversation with he and his wife I think he stable to be discharged home. I agree with the current medications including the lower dose of carvedilol 3.125 mg by mouth twice a day. I'll schedule close followup in the cardiology office within the next week. His wife and also like to be followed here as well which I will take care of. All questions answered.

## 2013-02-22 NOTE — Progress Notes (Signed)
D.c instructions reviewed with patient and wife.  Verbalized understanding.  Pt asked for court note from MD to be faxed over to the clerk of court.  Faxed note per request.  D/c to home with wife.  Schonewitz, Candelaria Stagers 02/22/2013

## 2013-02-22 NOTE — Discharge Summary (Signed)
Cody Hale  MRN: [id number]  DOB/AGE: [birth year] 62 y.o.  PCP: Sanda Linger, MD  Admit date: 02/17/2013  Discharge date: 02/22/2013    Discharge Diagnoses:  Subjective : complaining of being lightheaded  Hypotension secondary to diuretics Active Problems:  Depression  OSA (obstructive sleep apnea)  Diabetes mellitus, type II  Hypertension  H/O mitral valve replacement  Chronic systolic CHF (congestive heart failure)  Pancytopenia  Tachycardia       Medication List         albuterol (5 MG/ML) 0.5% nebulizer solution  Commonly known as:  PROVENTIL  Take 0.5 mL (2.5 mg total) by nebulization every 2 (two) hours as needed for wheezing.     aspirin 81 MG tablet  Take 81 mg by mouth daily.     buPROPion 150 MG 24 hr tablet  Commonly known as:  WELLBUTRIN XL  Take 1 tablet (150 mg total) by mouth daily. For depression     buPROPion 150 MG 12 hr tablet  Commonly known as:  WELLBUTRIN SR  Take 1 tablet (150 mg total) by mouth daily.     carvedilol 3.125 MG tablet  Commonly known as:  COREG  Take 1 tablet (3.125 mg total) by mouth 2 (two) times daily with a meal.     citalopram 20 MG tablet  Commonly known as:  CELEXA  Take 1 tablet (20 mg total) by mouth daily.     FISH OIL PO  Take 1 capsule by mouth daily.     furosemide 20 MG tablet  Commonly known as:  LASIX  Take 0.5 tablets (10 mg total) by mouth daily.     glipiZIDE 10 MG tablet  Commonly known as:  GLUCOTROL  Take 10 mg by mouth daily.     metoCLOPramide 10 MG tablet  Commonly known as:  REGLAN  Take 1 tablet (10 mg total) by mouth 4 (four) times daily -  before meals and at bedtime. To prevent vomiting     midodrine 5 MG tablet  Commonly known as:  PROAMATINE  Take 1 tablet (5 mg total) by mouth 3 (three) times daily with meals.     multivitamin with minerals Tabs tablet  Take 1 tablet by mouth daily.     nitroGLYCERIN 0.4 MG SL tablet  Commonly known as:  NITROSTAT  Place 1 tablet (0.4 mg  total) under the tongue every 5 (five) minutes x 3 doses as needed for chest pain.     traZODone 150 MG tablet  Commonly known as:  DESYREL  Take 150 mg by mouth at bedtime.           Discharge Condition:  Disposition: 01-Home or Self Care  Consults:    Significant Diagnostic Studies:  Dg Chest Port 1 View  02/17/2013 *RADIOLOGY REPORT* Clinical Data: Hypotension PORTABLE CHEST - 1 VIEW Comparison: Portable chest x-ray of 02/16/2013 Findings: The lungs are not quite as well aerated. Moderate cardiomegaly is stable. There may be minimal pulmonary vascular congestion remaining. No bony abnormality is seen. IMPRESSION: Stable cardiomegaly. Slightly diminished aeration. Question minimal pulmonary vascular congestion. Original Report Authenticated By: Dwyane Dee, M.D.  Dg Chest Portable 1 View  02/16/2013 *RADIOLOGY REPORT* Clinical Data: Cough, hypotension PORTABLE CHEST - 1 VIEW Comparison: 12/24/2012 Findings: Cardiac enlargement with vascular congestion. Negative for edema or effusion. Negative for pneumonia. IMPRESSION: Cardiac enlargement with mild vascular congestion Original Report Authenticated By: [name], M.D.    Microbiology:  No results found for this or any previous  visit (from the past 240 hour(s)).  Labs:  Results for orders placed during the hospital encounter of 02/17/13 (from the past 48 hour(s))   GLUCOSE, CAPILLARY Status: Abnormal    Collection Time    02/19/13 11:54 AM   Result  Value  Range    Glucose-Capillary  111 (*)  70 - 99 mg/dL    Comment 1  Notify RN    GLUCOSE, CAPILLARY Status: Abnormal    Collection Time    02/19/13 4:44 PM   Result  Value  Range    Glucose-Capillary  120 (*)  70 - 99 mg/dL    Comment 1  Notify RN    GLUCOSE, CAPILLARY Status: None    Collection Time    02/19/13 10:05 PM   Result  Value  Range    Glucose-Capillary  88  70 - 99 mg/dL    Comment 1  Notify RN    BASIC METABOLIC PANEL Status: Abnormal    Collection Time     02/20/13 7:15 AM   Result  Value  Range    Sodium  138  135 - 145 mEq/L    Potassium  5.4 (*)  3.5 - 5.1 mEq/L    Chloride  104  96 - 112 mEq/L    CO2  29  19 - 32 mEq/L    Glucose, Bld  145 (*)  70 - 99 mg/dL    BUN  12  6 - 23 mg/dL    Creatinine, Ser  4.09  0.50 - 1.35 mg/dL    Calcium  9.3  8.4 - 10.5 mg/dL    GFR calc non Af Amer  >90  >90 mL/min    GFR calc Af Amer  >90  >90 mL/min    Comment:  (NOTE)     The eGFR has been calculated using the CKD EPI equation.     This calculation has not been validated in all clinical situations.     eGFR's persistently <90 mL/min signify possible Chronic Kidney     Disease.   GLUCOSE, CAPILLARY Status: Abnormal    Collection Time    02/20/13 7:20 AM   Result  Value  Range    Glucose-Capillary  128 (*)  70 - 99 mg/dL    Comment 1  Notify RN    GLUCOSE, CAPILLARY Status: Abnormal    Collection Time    02/20/13 11:50 AM   Result  Value  Range    Glucose-Capillary  113 (*)  70 - 99 mg/dL    Comment 1  Notify RN    GLUCOSE, CAPILLARY Status: Abnormal    Collection Time    02/20/13 4:19 PM   Result  Value  Range    Glucose-Capillary  212 (*)  70 - 99 mg/dL    Comment 1  Notify RN    GLUCOSE, CAPILLARY Status: Abnormal    Collection Time    02/20/13 8:57 PM   Result  Value  Range    Glucose-Capillary  113 (*)  70 - 99 mg/dL    Comment 1  Notify RN    COMPREHENSIVE METABOLIC PANEL Status: Abnormal    Collection Time    02/21/13 6:33 AM   Result  Value  Range    Sodium  136  135 - 145 mEq/L    Potassium  4.6  3.5 - 5.1 mEq/L    Chloride  101  96 - 112 mEq/L    CO2  29  19 - 32  mEq/L    Glucose, Bld  126 (*)  70 - 99 mg/dL    BUN  12  6 - 23 mg/dL    Creatinine, Ser  1.61  0.50 - 1.35 mg/dL    Calcium  9.1  8.4 - 10.5 mg/dL    Total Protein  6.2  6.0 - 8.3 g/dL    Albumin  3.2 (*)  3.5 - 5.2 g/dL    AST  12  0 - 37 U/L    ALT  12  0 - 53 U/L    Alkaline Phosphatase  73  39 - 117 U/L    Total Bilirubin  0.5  0.3 - 1.2 mg/dL     GFR calc non Af Amer  >90  >90 mL/min    GFR calc Af Amer  >90  >90 mL/min    Comment:  (NOTE)     The eGFR has been calculated using the CKD EPI equation.     This calculation has not been validated in all clinical situations.     eGFR's persistently <90 mL/min signify possible Chronic Kidney     Disease.   GLUCOSE, CAPILLARY Status: Abnormal    Collection Time    02/21/13 7:28 AM   Result  Value  Range    Glucose-Capillary  116 (*)  70 - 99 mg/dL    Comment 1  Notify RN     Brief narrative  62 y.o. 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 He is being seen in the ED several times, given IV fluids and cut back on diuretic doses. He was again admitted on 02/17/13 for symptomatic hypotension.  Assessment/Plan:  2. Recurrent symptomatic hypotension: Most likely due to hypovolemia from diuresis, beta blockers and? Autonomic neuropathy. Cardiology consultation appreciated. Patient apparently took Coreg 12.5 mg only once in the morning and feels poorly a few hours after taking this medication. He was treated with IV fluids initially and his blood pressures improved. Cardiology has reduced his Coreg to 3.125 mg twice a day. Lasix reduced to 10 mg a day. He will need to follow up closely with cardiology for further optimization of his cardiomyopathy. He has also been started on midodrine 5mg  TID ,  and compression stockings with improvement in his blood pressure, orthostatics checked yesterday were negative and the patient  ambulated in the hallway  without assistance    3. Sinus tachycardia with bundle branch block morphology: Chronic LBBB an EKG. Patient has been in sinus tachycardia in the 120s to 130s since approximately 4 PM on 8/14. Discussed with Dr. Daleen Squibb on 8/15 . Patient not tolerating diuresis ,Soft blood pressures limiting up titration of beta blockers. Monitor on telemetry. He close outpatient cardiology followup   3. Pancytopenia: Unclear etiology. WBC normal. Hemoglobin stable. Platelets slightly low. Stable. 4. Type II DM: Continue outpatient regimen 5. Chronic systolic CHF: Clinically compensated. Diuretics held initially but then restarted at a lower dose due to orthostatic sx  6. Maj. depression: Required hospitalization in June 2014. Currently stable. Continue home medications. 7. History of bioprosthetic mitral valve replacement: 8. History of  atrial fibrillation: Currently in sinus tachycardia, but could also be atrial flutter with 2 in 1 block. Continue low dose Coreg. Cardiology to follow. 9. History of OSA   Discharge Exam:  Blood pressure 113/65, pulse 73, temperature 97.9 F (36.6 C), temperature source Oral, resp. rate 20, height 5\' 9"  (1.753 m), weight 53.116 kg (117 lb 1.6 oz), SpO2 97.00%.  General exam: Comfortable. Obese. Respiratory system: Clear. No increased work of breathing. Cardiovascular system: S1 & S2 heard, RRR. No JVD, murmurs, gallops, clicks or pedal edema. Bilateral lower extremity compression stockings. Telemetry: Sinus tachycardia in the 120s-130s with bundle branch morphology since 8/14, 4:30 PM Gastrointestinal system: Abdomen is nondistended, soft and nontender. Normal bowel sounds heard. Central nervous system: Alert and oriented. No focal neurological deficits. Extremities: Symmetric 5 x 5 power. Psychiatry: Pleasant, appropriate and establishes eye contact       Future Appointments  Provider  Department  Dept Phone    02/22/2013 9:30 AM  Ap-Crehp Monitor 2   [facility] CARDIAC REHABILITATION  [phone]    02/24/2013 9:30 AM  Ap-Crehp Monitor 2  [facility] CARDIAC REHABILITATION  [phone]    02/25/2013 3:45 PM  Rollene Rotunda, MD  [facility] Main Office [place])  [phone]    02/26/2013 9:30 AM  Ap-Crehp Monitor 2  [facility] CARDIAC REHABILITATION  [phone]    04/21/2013 3:15 PM  [name], MD  [facility]

## 2013-02-23 ENCOUNTER — Ambulatory Visit (HOSPITAL_COMMUNITY): Payer: Self-pay | Admitting: Psychiatry

## 2013-02-24 ENCOUNTER — Encounter (HOSPITAL_COMMUNITY): Payer: BC Managed Care – PPO

## 2013-02-25 ENCOUNTER — Ambulatory Visit: Payer: Self-pay | Admitting: Cardiology

## 2013-02-25 ENCOUNTER — Encounter: Payer: Self-pay | Admitting: Cardiovascular Disease

## 2013-02-25 ENCOUNTER — Ambulatory Visit (INDEPENDENT_AMBULATORY_CARE_PROVIDER_SITE_OTHER): Payer: BC Managed Care – PPO | Admitting: Cardiovascular Disease

## 2013-02-25 VITALS — BP 127/66 | HR 61 | Ht 69.0 in | Wt 254.2 lb

## 2013-02-25 DIAGNOSIS — I447 Left bundle-branch block, unspecified: Secondary | ICD-10-CM

## 2013-02-25 DIAGNOSIS — Z954 Presence of other heart-valve replacement: Secondary | ICD-10-CM

## 2013-02-25 DIAGNOSIS — Z952 Presence of prosthetic heart valve: Secondary | ICD-10-CM

## 2013-02-25 DIAGNOSIS — R000 Tachycardia, unspecified: Secondary | ICD-10-CM

## 2013-02-25 DIAGNOSIS — I5022 Chronic systolic (congestive) heart failure: Secondary | ICD-10-CM

## 2013-02-25 NOTE — Patient Instructions (Addendum)
Your physician recommends that you schedule a follow-up appointment in: 4 MONTHS  Your physician recommends that you continue on your current medications as directed. Please refer to the Current Medication list given to you today.   
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
## 2013-02-26 ENCOUNTER — Encounter (HOSPITAL_COMMUNITY): Payer: BC Managed Care – PPO

## 2013-03-01 ENCOUNTER — Telehealth: Payer: Self-pay | Admitting: Internal Medicine

## 2013-03-01 ENCOUNTER — Encounter (HOSPITAL_COMMUNITY)
Admission: RE | Admit: 2013-03-01 | Discharge: 2013-03-01 | Disposition: A | Discharge status: Home / Self Care | Payer: BC Managed Care – PPO | Source: Ambulatory Visit | Attending: Cardiology | Admitting: Cardiology

## 2013-03-01 NOTE — Telephone Encounter (Signed)
The patient's mother called and is hoping he can be worked in sooner for his new patient appointment.  She states he has recently left the hospital due to extensive cardiac issues.

## 2013-03-01 NOTE — Telephone Encounter (Signed)
I am not available for that right now

## 2013-03-01 NOTE — Telephone Encounter (Signed)
Patient notified

## 2013-03-02 ENCOUNTER — Ambulatory Visit (HOSPITAL_COMMUNITY): Payer: Self-pay | Admitting: Psychiatry

## 2013-03-03 ENCOUNTER — Encounter (HOSPITAL_COMMUNITY)
Admission: RE | Admit: 2013-03-03 | Discharge: 2013-03-03 | Disposition: A | Discharge status: Home / Self Care | Payer: BC Managed Care – PPO | Source: Ambulatory Visit | Attending: Cardiology | Admitting: Cardiology

## 2013-03-04 ENCOUNTER — Encounter (HOSPITAL_COMMUNITY): Payer: Self-pay | Admitting: Psychiatry

## 2013-03-04 ENCOUNTER — Ambulatory Visit (INDEPENDENT_AMBULATORY_CARE_PROVIDER_SITE_OTHER): Payer: BC Managed Care – PPO | Admitting: Psychiatry

## 2013-03-04 VITALS — BP 150/74 | Ht 69.0 in | Wt 257.0 lb

## 2013-03-04 DIAGNOSIS — F331 Major depressive disorder, recurrent, moderate: Secondary | ICD-10-CM

## 2013-03-04 MED ORDER — TRAZODONE HCL 150 MG PO TABS: 150.0000 mg | ORAL_TABLET | Freq: Every day | ORAL | Status: DC

## 2013-03-04 MED ORDER — CLONAZEPAM 0.5 MG PO TABS: 0.5000 mg | ORAL_TABLET | Freq: Three times a day (TID) | ORAL | Status: DC

## 2013-03-04 MED ORDER — CITALOPRAM HYDROBROMIDE 20 MG PO TABS: 20.0000 mg | ORAL_TABLET | Freq: Every day | ORAL | Status: DC
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
## 2013-03-05 ENCOUNTER — Encounter (HOSPITAL_COMMUNITY)
Admission: RE | Admit: 2013-03-05 | Discharge: 2013-03-05 | Disposition: A | Discharge status: Home / Self Care | Payer: BC Managed Care – PPO | Source: Ambulatory Visit | Attending: Cardiology | Admitting: Cardiology

## 2013-03-08 ENCOUNTER — Emergency Department (HOSPITAL_COMMUNITY): Payer: BC Managed Care – PPO

## 2013-03-08 ENCOUNTER — Encounter (HOSPITAL_COMMUNITY): Payer: Self-pay | Admitting: *Deleted

## 2013-03-08 ENCOUNTER — Encounter (HOSPITAL_COMMUNITY): Payer: BC Managed Care – PPO

## 2013-03-08 ENCOUNTER — Inpatient Hospital Stay (HOSPITAL_COMMUNITY)
Admission: EM | Admit: 2013-03-08 | Discharge: 2013-03-12 | DRG: 121 | Disposition: A | Discharge status: Home Health | Payer: BC Managed Care – PPO | Attending: Family Medicine | Admitting: Family Medicine

## 2013-03-08 DIAGNOSIS — Z952 Presence of prosthetic heart valve: Secondary | ICD-10-CM

## 2013-03-08 DIAGNOSIS — Z9119 Patient's noncompliance with other medical treatment and regimen: Secondary | ICD-10-CM

## 2013-03-08 DIAGNOSIS — Z79899 Other long term (current) drug therapy: Secondary | ICD-10-CM

## 2013-03-08 DIAGNOSIS — I872 Venous insufficiency (chronic) (peripheral): Secondary | ICD-10-CM | POA: Diagnosis present

## 2013-03-08 DIAGNOSIS — I214 Non-ST elevation (NSTEMI) myocardial infarction: Secondary | ICD-10-CM | POA: Diagnosis present

## 2013-03-08 DIAGNOSIS — E872 Acidosis, unspecified: Secondary | ICD-10-CM | POA: Diagnosis present

## 2013-03-08 DIAGNOSIS — I5023 Acute on chronic systolic (congestive) heart failure: Principal | ICD-10-CM | POA: Diagnosis present

## 2013-03-08 DIAGNOSIS — Z954 Presence of other heart-valve replacement: Secondary | ICD-10-CM

## 2013-03-08 DIAGNOSIS — I2510 Atherosclerotic heart disease of native coronary artery without angina pectoris: Secondary | ICD-10-CM | POA: Diagnosis present

## 2013-03-08 DIAGNOSIS — F332 Major depressive disorder, recurrent severe without psychotic features: Secondary | ICD-10-CM | POA: Diagnosis present

## 2013-03-08 DIAGNOSIS — F411 Generalized anxiety disorder: Secondary | ICD-10-CM | POA: Diagnosis present

## 2013-03-08 DIAGNOSIS — I471 Supraventricular tachycardia, unspecified: Secondary | ICD-10-CM | POA: Diagnosis present

## 2013-03-08 DIAGNOSIS — I428 Other cardiomyopathies: Secondary | ICD-10-CM | POA: Diagnosis present

## 2013-03-08 DIAGNOSIS — I959 Hypotension, unspecified: Secondary | ICD-10-CM

## 2013-03-08 DIAGNOSIS — E119 Type 2 diabetes mellitus without complications: Secondary | ICD-10-CM | POA: Diagnosis present

## 2013-03-08 DIAGNOSIS — Z6837 Body mass index (BMI) 37.0-37.9, adult: Secondary | ICD-10-CM

## 2013-03-08 DIAGNOSIS — G4733 Obstructive sleep apnea (adult) (pediatric): Secondary | ICD-10-CM | POA: Diagnosis present

## 2013-03-08 DIAGNOSIS — I447 Left bundle-branch block, unspecified: Secondary | ICD-10-CM | POA: Diagnosis present

## 2013-03-08 DIAGNOSIS — Z87891 Personal history of nicotine dependence: Secondary | ICD-10-CM

## 2013-03-08 DIAGNOSIS — I10 Essential (primary) hypertension: Secondary | ICD-10-CM

## 2013-03-08 DIAGNOSIS — Z91199 Patient's noncompliance with other medical treatment and regimen due to unspecified reason: Secondary | ICD-10-CM

## 2013-03-08 DIAGNOSIS — K3184 Gastroparesis: Secondary | ICD-10-CM | POA: Diagnosis present

## 2013-03-08 DIAGNOSIS — E118 Type 2 diabetes mellitus with unspecified complications: Secondary | ICD-10-CM | POA: Diagnosis present

## 2013-03-08 DIAGNOSIS — I5021 Acute systolic (congestive) heart failure: Secondary | ICD-10-CM

## 2013-03-08 DIAGNOSIS — I509 Heart failure, unspecified: Secondary | ICD-10-CM | POA: Diagnosis present

## 2013-03-08 DIAGNOSIS — J9600 Acute respiratory failure, unspecified whether with hypoxia or hypercapnia: Secondary | ICD-10-CM | POA: Diagnosis present

## 2013-03-08 DIAGNOSIS — M1990 Unspecified osteoarthritis, unspecified site: Secondary | ICD-10-CM | POA: Diagnosis present

## 2013-03-08 LAB — LIPASE, BLOOD: Lipase: 13 U/L (ref 11–59)

## 2013-03-08 LAB — COMPREHENSIVE METABOLIC PANEL
ALT: 17 U/L (ref 0–53)
AST: 17 U/L (ref 0–37)
Albumin: 4.1 g/dL (ref 3.5–5.2)
Alkaline Phosphatase: 95 U/L (ref 39–117)
BUN: 11 mg/dL (ref 6–23)
CO2: 30 mEq/L (ref 19–32)
Calcium: 9.8 mg/dL (ref 8.4–10.5)
Chloride: 103 mEq/L (ref 96–112)
Creatinine, Ser: 0.86 mg/dL (ref 0.50–1.35)
GFR calc Af Amer: >90 mL/min (ref >90)
GFR calc non Af Amer: >90 mL/min (ref >90)
Glucose, Bld: 227 mg/dL — ABNORMAL HIGH (ref 70–99)
Potassium: 4.4 mEq/L (ref 3.5–5.1)
Sodium: 142 mEq/L (ref 135–145)
Total Bilirubin: 0.4 mg/dL (ref 0.3–1.2)
Total Protein: 8 g/dL (ref 6.0–8.3)

## 2013-03-08 LAB — TROPONIN I: Troponin I: <0.3 ng/mL (ref <0.30)

## 2013-03-08 LAB — GLUCOSE, CAPILLARY
Glucose-Capillary: 171 mg/dL — ABNORMAL HIGH (ref 70–99)
Glucose-Capillary: 237 mg/dL — ABNORMAL HIGH (ref 70–99)

## 2013-03-08 LAB — PRO B NATRIURETIC PEPTIDE: Pro B Natriuretic peptide (BNP): 1304 pg/mL — ABNORMAL HIGH (ref 0–125)

## 2013-03-08 MED ORDER — SODIUM CHLORIDE 0.9 % IV SOLN: INTRAVENOUS | Status: DC

## 2013-03-08 MED ORDER — ONDANSETRON HCL 4 MG/2ML IJ SOLN
4.0000 mg | Freq: Once | INTRAMUSCULAR | Status: AC
  Administered 2013-03-08: 4 mg via INTRAVENOUS

## 2013-03-08 MED ORDER — ONDANSETRON HCL 4 MG/2ML IJ SOLN: 4.0000 mg | Freq: Once | INTRAMUSCULAR | Status: DC

## 2013-03-08 MED ORDER — FUROSEMIDE 10 MG/ML IJ SOLN
80.0000 mg | Freq: Once | INTRAMUSCULAR | Status: AC
  Administered 2013-03-08: 80 mg via INTRAVENOUS
  Filled 2013-03-08: qty 8

## 2013-03-08 MED ORDER — NITROGLYCERIN IN D5W 200-5 MCG/ML-% IV SOLN
INTRAVENOUS | Status: AC
  Administered 2013-03-08: 50000 ug via INTRAVENOUS
  Filled 2013-03-08: qty 250

## 2013-03-08 NOTE — ED Notes (Addendum)
abd pain, vomiting, no diarrhea.slept most of day,

## 2013-03-08 NOTE — ED Notes (Signed)
bipap started.

## 2013-03-09 ENCOUNTER — Ambulatory Visit: Payer: Self-pay | Admitting: Internal Medicine

## 2013-03-09 ENCOUNTER — Encounter (HOSPITAL_COMMUNITY): Payer: Self-pay | Admitting: *Deleted

## 2013-03-09 DIAGNOSIS — I5021 Acute systolic (congestive) heart failure: Secondary | ICD-10-CM

## 2013-03-09 DIAGNOSIS — I10 Essential (primary) hypertension: Secondary | ICD-10-CM

## 2013-03-09 DIAGNOSIS — I214 Non-ST elevation (NSTEMI) myocardial infarction: Secondary | ICD-10-CM | POA: Diagnosis present

## 2013-03-09 DIAGNOSIS — J9600 Acute respiratory failure, unspecified whether with hypoxia or hypercapnia: Secondary | ICD-10-CM | POA: Diagnosis present

## 2013-03-09 DIAGNOSIS — I509 Heart failure, unspecified: Secondary | ICD-10-CM

## 2013-03-09 DIAGNOSIS — E119 Type 2 diabetes mellitus without complications: Secondary | ICD-10-CM

## 2013-03-09 DIAGNOSIS — G4733 Obstructive sleep apnea (adult) (pediatric): Secondary | ICD-10-CM

## 2013-03-09 LAB — CBC WITH DIFFERENTIAL/PLATELET
Basophils Absolute: 0 10*3/uL (ref 0.0–0.1)
Basophils Relative: 0 % (ref 0–1)
Eosinophils Absolute: 0 10*3/uL (ref 0.0–0.7)
Eosinophils Relative: 0 % (ref 0–5)
HCT: 43.1 % (ref 39.0–52.0)
Hemoglobin: 13.7 g/dL (ref 13.0–17.0)
Lymphocytes Relative: 7 % — ABNORMAL LOW (ref 12–46)
Lymphs Abs: 0.5 10*3/uL — ABNORMAL LOW (ref 0.7–4.0)
MCH: 28.7 pg (ref 26.0–34.0)
MCHC: 31.8 g/dL (ref 30.0–36.0)
MCV: 90.2 fL (ref 78.0–100.0)
Monocytes Absolute: 0.1 10*3/uL (ref 0.1–1.0)
Monocytes Relative: 2 % — ABNORMAL LOW (ref 3–12)
Neutro Abs: 6.5 10*3/uL (ref 1.7–7.7)
Neutrophils Relative %: 91 % — ABNORMAL HIGH (ref 43–77)
Platelets: ADEQUATE 10*3/uL (ref 150–400)
RBC: 4.78 MIL/uL (ref 4.22–5.81)
RDW: 19.1 % — ABNORMAL HIGH (ref 11.5–15.5)
Smear Review: ADEQUATE
WBC: 7.1 10*3/uL (ref 4.0–10.5)

## 2013-03-09 LAB — URINALYSIS, ROUTINE W REFLEX MICROSCOPIC
Bilirubin Urine: NEGATIVE
Glucose, UA: NEGATIVE mg/dL
Hgb urine dipstick: NEGATIVE
Ketones, ur: 15 mg/dL — AB
Leukocytes, UA: NEGATIVE
Nitrite: NEGATIVE
Protein, ur: NEGATIVE mg/dL
Specific Gravity, Urine: 1.02 (ref 1.005–1.030)
Urobilinogen, UA: 0.2 mg/dL (ref 0.0–1.0)
pH: 7 (ref 5.0–8.0)

## 2013-03-09 LAB — GLUCOSE, CAPILLARY
Glucose-Capillary: 194 mg/dL — ABNORMAL HIGH (ref 70–99)
Glucose-Capillary: 207 mg/dL — ABNORMAL HIGH (ref 70–99)
Glucose-Capillary: 152 mg/dL — ABNORMAL HIGH (ref 70–99)
Glucose-Capillary: 124 mg/dL — ABNORMAL HIGH (ref 70–99)

## 2013-03-09 LAB — BLOOD GAS, ARTERIAL
Acid-Base Excess: 1.4 mmol/L (ref 0.0–2.0)
Acid-Base Excess: 8.9 mmol/L — ABNORMAL HIGH (ref 0.0–2.0)
Bicarbonate: 25.5 mEq/L — ABNORMAL HIGH (ref 20.0–24.0)
Bicarbonate: 33.1 mEq/L — ABNORMAL HIGH (ref 20.0–24.0)
Delivery systems: POSITIVE
Drawn by: 21310
Expiratory PAP: 5
FIO2: 100 %
Inspiratory PAP: 15
O2 Content: 4 L/min
O2 Saturation: 90.1 %
O2 Saturation: 98.7 %
Patient temperature: 37
TCO2: 23.5 mmol/L (ref 0–100)
TCO2: 29.2 mmol/L (ref 0–100)
pCO2 arterial: 47.6 mmHg — ABNORMAL HIGH (ref 35.0–45.0)
pCO2 arterial: 66.1 mmHg (ref 35.0–45.0)
pH, Arterial: 7.211 — ABNORMAL LOW (ref 7.350–7.450)
pH, Arterial: 7.457 — ABNORMAL HIGH (ref 7.350–7.450)
pO2, Arterial: 114 mmHg — ABNORMAL HIGH (ref 80.0–100.0)
pO2, Arterial: 69.8 mmHg — ABNORMAL LOW (ref 80.0–100.0)

## 2013-03-09 LAB — COMPREHENSIVE METABOLIC PANEL
ALT: 15 U/L (ref 0–53)
AST: 19 U/L (ref 0–37)
Albumin: 3.7 g/dL (ref 3.5–5.2)
Alkaline Phosphatase: 95 U/L (ref 39–117)
BUN: 13 mg/dL (ref 6–23)
CO2: 32 mEq/L (ref 19–32)
Calcium: 9.3 mg/dL (ref 8.4–10.5)
Chloride: 101 mEq/L (ref 96–112)
Creatinine, Ser: 0.99 mg/dL (ref 0.50–1.35)
GFR calc Af Amer: >90 mL/min (ref >90)
GFR calc non Af Amer: 86 mL/min — ABNORMAL LOW (ref >90)
Glucose, Bld: 237 mg/dL — ABNORMAL HIGH (ref 70–99)
Potassium: 3.8 mEq/L (ref 3.5–5.1)
Sodium: 142 mEq/L (ref 135–145)
Total Bilirubin: 0.5 mg/dL (ref 0.3–1.2)
Total Protein: 7.7 g/dL (ref 6.0–8.3)

## 2013-03-09 LAB — CBC
HCT: 44.2 % (ref 39.0–52.0)
Hemoglobin: 13.9 g/dL (ref 13.0–17.0)
MCH: 28.5 pg (ref 26.0–34.0)
MCHC: 31.4 g/dL (ref 30.0–36.0)
MCV: 90.6 fL (ref 78.0–100.0)
Platelets: 233 10*3/uL (ref 150–400)
RBC: 4.88 MIL/uL (ref 4.22–5.81)
RDW: 19.2 % — ABNORMAL HIGH (ref 11.5–15.5)
WBC: 20.1 10*3/uL — ABNORMAL HIGH (ref 4.0–10.5)

## 2013-03-09 LAB — TROPONIN I
Troponin I: 1.06 ng/mL (ref <0.30)
Troponin I: 2.75 ng/mL (ref <0.30)
Troponin I: 3.65 ng/mL (ref <0.30)

## 2013-03-09 LAB — MRSA PCR SCREENING: MRSA by PCR: NEGATIVE

## 2013-03-09 MED ORDER — SODIUM CHLORIDE 0.9 % IV SOLN: 250.0000 mL | INTRAVENOUS | Status: DC | PRN

## 2013-03-09 MED ORDER — CARVEDILOL 3.125 MG PO TABS
3.1250 mg | ORAL_TABLET | Freq: Two times a day (BID) | ORAL | Status: DC
  Administered 2013-03-09 – 2013-03-12 (×7): 3.125 mg via ORAL
  Filled 2013-03-09 (×7): qty 1

## 2013-03-09 MED ORDER — METOCLOPRAMIDE HCL 10 MG PO TABS
10.0000 mg | ORAL_TABLET | Freq: Three times a day (TID) | ORAL | Status: DC
  Administered 2013-03-09 – 2013-03-12 (×14): 10 mg via ORAL
  Filled 2013-03-09 (×14): qty 1

## 2013-03-09 MED ORDER — FUROSEMIDE 10 MG/ML IJ SOLN
40.0000 mg | Freq: Two times a day (BID) | INTRAMUSCULAR | Status: DC
  Administered 2013-03-09 – 2013-03-11 (×5): 40 mg via INTRAVENOUS
  Filled 2013-03-09 (×5): qty 4

## 2013-03-09 MED ORDER — NITROGLYCERIN 0.4 MG SL SUBL: 0.4000 mg | SUBLINGUAL_TABLET | SUBLINGUAL | Status: DC | PRN

## 2013-03-09 MED ORDER — TRAZODONE HCL 50 MG PO TABS
150.0000 mg | ORAL_TABLET | Freq: Every day | ORAL | Status: DC
  Administered 2013-03-10 – 2013-03-11 (×2): 150 mg via ORAL
  Filled 2013-03-09 (×2): qty 3

## 2013-03-09 MED ORDER — BUMETANIDE 0.25 MG/ML IJ SOLN
1.0000 mg | Freq: Once | INTRAMUSCULAR | Status: AC
  Administered 2013-03-09: 1 mg via INTRAVENOUS

## 2013-03-09 MED ORDER — ALBUTEROL SULFATE (5 MG/ML) 0.5% IN NEBU: 2.5000 mg | INHALATION_SOLUTION | RESPIRATORY_TRACT | Status: DC | PRN

## 2013-03-09 MED ORDER — IPRATROPIUM BROMIDE 0.02 % IN SOLN
0.5000 mg | Freq: Four times a day (QID) | RESPIRATORY_TRACT | Status: DC
  Administered 2013-03-09 – 2013-03-12 (×15): 0.5 mg via RESPIRATORY_TRACT
  Filled 2013-03-09 (×15): qty 2.5

## 2013-03-09 MED ORDER — INSULIN ASPART 100 UNIT/ML ~~LOC~~ SOLN: 0.0000 [IU] | Freq: Every day | SUBCUTANEOUS | Status: DC

## 2013-03-09 MED ORDER — SODIUM CHLORIDE 0.9 % IV SOLN: INTRAVENOUS | Status: DC

## 2013-03-09 MED ORDER — ONDANSETRON HCL 4 MG PO TABS: 4.0000 mg | ORAL_TABLET | Freq: Four times a day (QID) | ORAL | Status: DC | PRN

## 2013-03-09 MED ORDER — INSULIN ASPART 100 UNIT/ML ~~LOC~~ SOLN
0.0000 [IU] | SUBCUTANEOUS | Status: DC
  Administered 2013-03-09: 2 [IU] via SUBCUTANEOUS
  Administered 2013-03-09 – 2013-03-10 (×3): 1 [IU] via SUBCUTANEOUS

## 2013-03-09 MED ORDER — CITALOPRAM HYDROBROMIDE 20 MG PO TABS
20.0000 mg | ORAL_TABLET | Freq: Every day | ORAL | Status: DC
  Administered 2013-03-09 – 2013-03-12 (×4): 20 mg via ORAL
  Filled 2013-03-09 (×4): qty 1

## 2013-03-09 MED ORDER — INSULIN ASPART 100 UNIT/ML ~~LOC~~ SOLN
0.0000 [IU] | Freq: Three times a day (TID) | SUBCUTANEOUS | Status: DC
  Administered 2013-03-09: 3 [IU] via SUBCUTANEOUS
  Administered 2013-03-09: 2 [IU] via SUBCUTANEOUS

## 2013-03-09 MED ORDER — MIDODRINE HCL 5 MG PO TABS
5.0000 mg | ORAL_TABLET | Freq: Three times a day (TID) | ORAL | Status: DC
  Administered 2013-03-09 – 2013-03-10 (×4): 5 mg via ORAL
  Filled 2013-03-09 (×11): qty 1

## 2013-03-09 MED ORDER — SODIUM CHLORIDE 0.9 % IJ SOLN
3.0000 mL | INTRAMUSCULAR | Status: DC | PRN
  Administered 2013-03-09: 3 mL via INTRAVENOUS

## 2013-03-09 MED ORDER — BUMETANIDE 0.25 MG/ML IJ SOLN
INTRAMUSCULAR | Status: AC
  Filled 2013-03-09: qty 4

## 2013-03-09 MED ORDER — ASPIRIN EC 81 MG PO TBEC
81.0000 mg | DELAYED_RELEASE_TABLET | Freq: Every day | ORAL | Status: DC
  Administered 2013-03-09 – 2013-03-12 (×4): 81 mg via ORAL
  Filled 2013-03-09 (×6): qty 1

## 2013-03-09 MED ORDER — ALBUTEROL SULFATE (5 MG/ML) 0.5% IN NEBU
2.5000 mg | INHALATION_SOLUTION | Freq: Four times a day (QID) | RESPIRATORY_TRACT | Status: DC
  Administered 2013-03-09 – 2013-03-12 (×15): 2.5 mg via RESPIRATORY_TRACT
  Filled 2013-03-09 (×15): qty 0.5

## 2013-03-09 MED ORDER — ONDANSETRON HCL 4 MG/2ML IJ SOLN
4.0000 mg | Freq: Four times a day (QID) | INTRAMUSCULAR | Status: DC | PRN
  Administered 2013-03-09: 4 mg via INTRAVENOUS
  Filled 2013-03-09: qty 2

## 2013-03-09 MED ORDER — SODIUM CHLORIDE 0.9 % IJ SOLN
3.0000 mL | Freq: Two times a day (BID) | INTRAMUSCULAR | Status: DC
  Administered 2013-03-09 – 2013-03-12 (×8): 3 mL via INTRAVENOUS

## 2013-03-09 MED ORDER — ADULT MULTIVITAMIN W/MINERALS CH
1.0000 | ORAL_TABLET | Freq: Every day | ORAL | Status: DC
  Administered 2013-03-09 – 2013-03-12 (×4): 1 via ORAL
  Filled 2013-03-09 (×4): qty 1

## 2013-03-09 MED ORDER — ENOXAPARIN SODIUM 40 MG/0.4ML ~~LOC~~ SOLN
40.0000 mg | SUBCUTANEOUS | Status: DC
  Administered 2013-03-09 – 2013-03-12 (×4): 40 mg via SUBCUTANEOUS
  Filled 2013-03-09 (×4): qty 0.4

## 2013-03-09 MED ORDER — CLONAZEPAM 0.5 MG PO TABS
0.5000 mg | ORAL_TABLET | Freq: Three times a day (TID) | ORAL | Status: DC
  Administered 2013-03-09 – 2013-03-12 (×8): 0.5 mg via ORAL
  Filled 2013-03-09 (×8): qty 1

## 2013-03-09 NOTE — Progress Notes (Signed)
CRITICAL VALUE ALERT  Critical value received:  Troponin  Date of notification:  03-09-2013  Time of notification:  09:50  Critical value read back:yes  Nurse who received alert:  Gypsy Decant  MD notified (1st page):  Dr. Kerry Hough and K. Lyman Bishop, PA with [facility]  Time of first page:  Verbal communication, both in the unit  MD notified (2nd page):  Time of second page:  Responding MD:  Dr. Kerry Hough and K. Lawrence  Time MD responded:  [phone]
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
## 2013-03-09 NOTE — ED Notes (Signed)
Now coughing up frothy sputum, ERMD @ bedside
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
## 2013-03-09 NOTE — Progress Notes (Signed)
UR chart review completed.  

## 2013-03-09 NOTE — Progress Notes (Signed)
Critical troponin 1.06; paged MD on call; no new orders received.

## 2013-03-09 NOTE — ED Notes (Signed)
CRITICAL VALUE ALERT  Critical value received: abg  Date of notification: 03/09/13  Time of notification:  0033  Critical value read back:yes  Nurse who received  smoore rn  MD notified (1st page): dr Deretha Emory  Time of first page:  notified (2nd page):  Time of second page:  Responding MD:dr zackowski Time MD responded: [phone]

## 2013-03-09 NOTE — ED Notes (Signed)
Much more relaxed. Laying back on stretcher, allowing bipap to work. Skin warm and drying

## 2013-03-09 NOTE — Care Management Note (Signed)
    Page 1 of 2   03/12/2013     2:08:08 PM   CARE MANAGEMENT NOTE 03/12/2013  Patient:  Cody Hale, Cody Hale   Account Number:  [id number]  Date Initiated:  03/09/2013  Documentation initiated by:  Sharrie Rothman  Subjective/Objective Assessment:   Pt admitted from home with CHF. Pt lives with his wife and has recently required more help with ADL's. Pt has been participating in cardiac rehab at [facility] recently.     Action/Plan:   Cm explained placement process and notified them of the need for PT recommendations. Both verbalized understanding. CSW aware of possible need for placement. Will continue to follow for [facility] needs as well.   Anticipated DC Date:  03/12/2013   Anticipated DC Plan:  SKILLED NURSING FACILITY  In-house referral  Clinical Social Worker      DC Associate Professor  CM consult      [facility] Choice  HOME HEALTH   Choice offered to / List presented to:  C-1 Patient   DME arranged  BIPAP  NEBULIZER MACHINE      DME agency  [facility]     HH arranged  HH-1 RN  HH-2 PT      [facility] agency  [facility]   Status of service:  Completed, signed off Medicare Important Message given?   (If response is "NO", the following Medicare IM given date fields will be blank) Date Medicare IM given:   Date Additional Medicare IM given:    Discharge Disposition:  HOME W HOME HEALTH SERVICES  Per UR Regulation:  Reviewed for med. necessity/level of care/duration of stay  If discussed at Long Length of Stay Meetings, dates discussed:    Comments:  03/12/13 1315 Anibal Henderson RN/CM Spoke with pt and spouse again. Pt will not need O2. All sats above 94%, and they are fine with this. They have used [facility] in the past and would like to continue with them. Pt and spouse comfortable with D/C without O2. Sleep study information optained for MD to sign and faxed back to Sleep center. They will call pt with an appt. Pt informed of this. 03/12/13 1000 Anibal Henderson RN/CM  Spoke with pt and spouse concerning D/C today. They are aware that BiPAP is being set up, and primary RN is to check sats to see if pt will be needing O2. CM will be available to set up all needed home services once MD completes D/C 03/11/13 1415 Arlyss Queen, RN BSN CM Bedside RN approached CM about pts wife being upset stating that we were going to send her husband home without O2 and cpap and he would die. CM attempted to speak with pts wife and pt in the pts room to explain the process of qualifying for home O2 and cpap for home. Pts wife very upset and would not listen to what CM had to say.Pts wife stormed out of the room yelling that we just did not want to help him. CM was able to explain to the pt the process of qualifying for home O2 and cpap. Pt verbalized understanding. Cm explained to pt that Cm would be glad to talk with pts wife when she was more calm and able to have conversation.  03/09/13 1345 Arlyss Queen, RN BSN CM

## 2013-03-09 NOTE — ED Notes (Signed)
Remains calm, dozing. Skin w/d.

## 2013-03-09 NOTE — ED Notes (Signed)
Restlessness increasing, pt very uncomfortable. Dr.Zackowski consulted.

## 2013-03-09 NOTE — Progress Notes (Signed)
Patient admitted to the hospital earlier this morning by Dr. Sharl Ma.  Patient seen and examined.  The patient was admitted to the hospital with abdominal pain and shortness of breath. He was found to be in acute on chronic systolic congestive heart failure. He was started on nitroglycerin infusion as well as BiPAP and admitted to the step down unit for diuresis. Since his admission, he has been weaned off the BiPAP. His respiratory status appears to be improving. He still has crackles on exams bilaterally and will be to continue with diuresis. Cardiology is following. He does have a elevation in his troponin at 2.75. This is likely non-ST elevation MI due to demand ischemia from congestive heart failure. We'll defer further treatments to cardiology. His leukocytosis is concerning. This may be due to stress demargination. Abdominal exam appears benign. He does not have any significant tenderness or signs of an acute abdomen. He is afebrile and overall, clinically appears to be improving. We'll continue to monitor closely.  MEMON,JEHANZEB

## 2013-03-10 ENCOUNTER — Encounter (HOSPITAL_COMMUNITY): Payer: BC Managed Care – PPO

## 2013-03-10 ENCOUNTER — Inpatient Hospital Stay (HOSPITAL_COMMUNITY): Payer: BC Managed Care – PPO

## 2013-03-10 DIAGNOSIS — Z954 Presence of other heart-valve replacement: Secondary | ICD-10-CM

## 2013-03-10 DIAGNOSIS — I214 Non-ST elevation (NSTEMI) myocardial infarction: Secondary | ICD-10-CM

## 2013-03-10 DIAGNOSIS — J9600 Acute respiratory failure, unspecified whether with hypoxia or hypercapnia: Secondary | ICD-10-CM

## 2013-03-10 DIAGNOSIS — I959 Hypotension, unspecified: Secondary | ICD-10-CM

## 2013-03-10 DIAGNOSIS — I5023 Acute on chronic systolic (congestive) heart failure: Principal | ICD-10-CM

## 2013-03-10 LAB — GLUCOSE, CAPILLARY
Glucose-Capillary: 119 mg/dL — ABNORMAL HIGH (ref 70–99)
Glucose-Capillary: 121 mg/dL — ABNORMAL HIGH (ref 70–99)
Glucose-Capillary: 128 mg/dL — ABNORMAL HIGH (ref 70–99)
Glucose-Capillary: 128 mg/dL — ABNORMAL HIGH (ref 70–99)
Glucose-Capillary: 143 mg/dL — ABNORMAL HIGH (ref 70–99)

## 2013-03-10 LAB — CBC
HCT: 41.4 % (ref 39.0–52.0)
Hemoglobin: 12.6 g/dL — ABNORMAL LOW (ref 13.0–17.0)
MCH: 28.4 pg (ref 26.0–34.0)
MCHC: 30.4 g/dL (ref 30.0–36.0)
MCV: 93.5 fL (ref 78.0–100.0)
Platelets: 189 10*3/uL (ref 150–400)
RBC: 4.43 MIL/uL (ref 4.22–5.81)
RDW: 19.6 % — ABNORMAL HIGH (ref 11.5–15.5)
WBC: 11.2 10*3/uL — ABNORMAL HIGH (ref 4.0–10.5)

## 2013-03-10 LAB — COMPREHENSIVE METABOLIC PANEL
ALT: 13 U/L (ref 0–53)
AST: 23 U/L (ref 0–37)
Albumin: 3.4 g/dL — ABNORMAL LOW (ref 3.5–5.2)
Alkaline Phosphatase: 71 U/L (ref 39–117)
BUN: 29 mg/dL — ABNORMAL HIGH (ref 6–23)
CO2: 35 mEq/L — ABNORMAL HIGH (ref 19–32)
Calcium: 9.4 mg/dL (ref 8.4–10.5)
Chloride: 100 mEq/L (ref 96–112)
Creatinine, Ser: 1.04 mg/dL (ref 0.50–1.35)
GFR calc Af Amer: 87 mL/min — ABNORMAL LOW (ref >90)
GFR calc non Af Amer: 75 mL/min — ABNORMAL LOW (ref >90)
Glucose, Bld: 129 mg/dL — ABNORMAL HIGH (ref 70–99)
Potassium: 3.9 mEq/L (ref 3.5–5.1)
Sodium: 144 mEq/L (ref 135–145)
Total Bilirubin: 0.6 mg/dL (ref 0.3–1.2)
Total Protein: 7 g/dL (ref 6.0–8.3)

## 2013-03-10 MED ORDER — INSULIN ASPART 100 UNIT/ML ~~LOC~~ SOLN
0.0000 [IU] | Freq: Three times a day (TID) | SUBCUTANEOUS | Status: DC
  Administered 2013-03-10: 1 [IU] via SUBCUTANEOUS
  Administered 2013-03-11 (×2): 2 [IU] via SUBCUTANEOUS
  Administered 2013-03-11 – 2013-03-12 (×2): 1 [IU] via SUBCUTANEOUS
  Administered 2013-03-12: 2 [IU] via SUBCUTANEOUS

## 2013-03-10 NOTE — Progress Notes (Signed)
The patient was seen and examined, and I agree with the assessment and plan as documented above. He is feeling much better, and has diuresed well. It appears he has been eating a lot of sodium-laden foods recently, which likely contributed in large part to this decompensation. I am having a nutritionist speak with him. Will likely switch to oral furosemide tomorrow. Will not adjust Coreg dose at this time, nor start ACEI's (Ramipril) but would consider this in the outpatient setting. He has had multiple hospital admissions for hypotension and over-diuresis. He denies chest pain and shortness of breath. I am not inclined to pursue coronary angiography at this time, and will continue current medical therapy.

## 2013-03-10 NOTE — Progress Notes (Signed)
RT observed patient not breathing for approximately 20secs, When asked the patient and his wife do he have sleep apnea  Or has he had a sleep study done they both repied "no". RT later observed patient again not breathing while asleep and RN was made aware. Patient aroused at this time; therefore, RT did not place on BIPAP machine,

## 2013-03-10 NOTE — Evaluation (Signed)
Physical Therapy Evaluation Patient Details Name: Cody Hale MRN: [id number] DOB: [birth year] Today's Date: 03/10/2013 Time: 0820-0904 PT Time Calculation (min): 44 min  PT Assessment / Plan / Recommendation History of Present Illness   Pt is a 62 yo male admitted to [facility] with  CHF.  Pt normally lives with wife and uses no assistive device.     PT Assessment  Patient needs continued PT services    Follow Up Recommendations  Home health PT    Does the patient have the potential to tolerate intense rehabilitation    N/A  Barriers to Discharge  none      Equipment Recommendations    none   Recommendations for Other Services   none  Frequency Min 3X/week    Precautions / Restrictions Precautions Precautions: None Restrictions Weight Bearing Restrictions: No   Pertinent Vitals/Pain 0/10      Mobility  Bed Mobility Bed Mobility: Supine to Sit Supine to Sit: 6: Modified independent (Device/Increase time) Transfers Transfers: Sit to Stand Sit to Stand: 7: Independent Ambulation/Gait Ambulation/Gait Assistance: 7: Independent Ambulation Distance (Feet): 185 Feet Assistive device: None Ambulation/Gait Assistance Details: Pt 02 stat dropped to 88 but able to recover with therapist reminding pt to breathe through his nose and deeply Gait Pattern: Within Functional Limits    Exercises General Exercises - Lower Extremity Ankle Circles/Pumps: AROM;Both;10 reps Quad Sets: AROM;Both;10 reps Gluteal Sets: AROM;Both;10 reps Straight Leg Raises: AROM;Both;10 reps   PT Diagnosis: Generalized weakness  PT Problem List: Decreased activity tolerance PT Treatment Interventions: Gait training;Therapeutic activities;Therapeutic exercise     PT Goals(Current goals can be found in the care plan section) Acute Rehab PT Goals PT Goal Formulation: With patient Time For Goal Achievement: 03/12/13 Potential to Achieve Goals: Good  Visit Information  Last PT Received On:  03/10/13       Prior Functioning  Home Living Family/patient expects to be discharged to:: Private residence Living Arrangements: Spouse/significant other Available Help at Discharge: Family Type of Home: House Home Access: Level entry Home Layout: One level Home Equipment: None Prior Function Level of Independence: Independent Communication Communication: No difficulties Dominant Hand: Right    Cognition  Cognition Arousal/Alertness: Awake/alert Behavior During Therapy: WFL for tasks assessed/performed Overall Cognitive Status: Within Functional Limits for tasks assessed    Extremity/Trunk Assessment Lower Extremity Assessment Lower Extremity Assessment: Overall WFL for tasks assessed Cervical / Trunk Assessment Cervical / Trunk Assessment: Kyphotic   Balance    End of Session PT - End of Session Equipment Utilized During Treatment: Gait belt Activity Tolerance: Patient tolerated treatment well Patient left: in chair;with call bell/phone within reach;with family/visitor present  GP     Birgit Nowling,CINDY 03/10/2013, 9:04 AM

## 2013-03-10 NOTE — Progress Notes (Signed)
[facility] PROGRESS NOTE  WILLOUGHBY DOELL ZOX:[id number] DOB: 08/15/1950 DOA: 03/08/2013 PCP: Sanda Linger, MD Primary Cardiologist: Beulah Gandy  Assessment/Plan: 1. Acute respiratory failure with hypoxia, hypercapnia and respiratory acidosis: Appears resolved. Secondary to acute heart failure. 2. Acute on chronic systolic congestive heart failure: Improving. Continue diuresis per cardiology. Ejection fraction 35%. History of noncompliance noted. Continue current medications per cardiology. Followup with CHF clinic in [place] as an outpatient. Will not adjust Coreg dose at this time, nor start ACEI's (Ramipril) but would consider this in the outpatient setting. He has had multiple hospital admissions for hypotension and over-diuresis. 3. Elevated troponin: Thought to be secondary to decompensated acute heart failure rather than ischemia. Recent catheterization demonstrating nonobstructive coronary artery disease. Further evaluation and recommendations per cardiology. 4. Abdominal pain, vomiting: This is present on admission, percent resolved. No further investigation suggested at this point. 5. Diabetes mellitus type 2: Capillary blood sugars stable. Continue sliding scale insulin. 6. history of major depression 7. History of St. Jude bioprosthetic MV replacement in 09/2012 8. non-obstructive CAD per cath in 08/2012,systolic dysfunction with most recent EF in 11/2012 of 35%, 9. obstructive sleep apnea 10. Obesity unspecified    Continue management of heart failure per cardiology  Discontinue midodrine  Transfer to medical floor  Pending studies:   None  Code Status: Full DVT prophylaxis: Lovenox Family Communication: none present Disposition Plan: home when improved  Brendia Sacks, MD  [facility]  Pager [phone] If 7PM-7AM, please contact night-coverage at [email or web address], password [facility] 03/10/2013, 11:30 AM  LOS: 2 days   Summary: 62 year old man with history of  heart failure, diabetes, endocarditis, morbid obesity presented to the emergency department with a complaint of abdominal pain. He was hypoxic and respiratory status rapidly deteriorated requiring BiPAP in the emergency department. ABG revealed hypoxia, hypercapnia and acute respiratory acidosis.  admitted for acute systolic congestive heart failure and stabilization on BiPAP. He improved diuresis, subsequent troponins demonstrated elevation.   Consultants:  Cardiology  Physical therapy: Home health  Procedures:    Antibiotics:    HPI/Subjective: Overall feels okay. Breathing okay. No pain, nausea or vomiting.  Objective: Filed Vitals:   03/10/13 0728 03/10/13 0730 03/10/13 0900 03/10/13 0940  BP:   145/63 145/63  Pulse:   63   Temp:  97.8 F (36.6 C)    TempSrc:  Axillary    Resp:   16   Height:      Weight:      SpO2: 99%  98%     Intake/Output Summary (Last 24 hours) at 03/10/13 1130 Last data filed at 03/10/13 0500  Gross per 24 hour  Intake    243 ml  Output   1650 ml  Net  -1407 ml     Filed Weights   03/09/13 0330 03/09/13 0427 03/10/13 0500  Weight: 118.3 kg (260 lb 12.9 oz) 116.3 kg (256 lb 6.3 oz) 114 kg (251 lb 5.2 oz)    Exam:   Afebrile, vital signs stable.   General: Appears calm and comfortable. Speech fluent and clear.  Psychiatric: Grossly normal mood and affect.  Cardiovascular: Regular rate and rhythm. 2/6 systolic murmur. No rub or gallop. 1+ bilateral lower extremity edema.  Respiratory: Clear to auscultation bilaterally. No wheezes, rales, rhonchi. Normal respiratory effort.  Data Reviewed:   capillary blood sugars stable   basic metabolic panel unremarkable    leukocytosis nearly resolved    chest x-ray shows improvement in volume overload  Scheduled Meds: . albuterol  2.5 mg  Nebulization Q6H  . aspirin EC  81 mg Oral Daily  . carvedilol  3.125 mg Oral BID WC  . citalopram  20 mg Oral Daily  . clonazePAM  0.5 mg Oral  TID  . enoxaparin (LOVENOX) injection  40 mg Subcutaneous Q24H  . furosemide  40 mg Intravenous Q12H  . insulin aspart  0-9 Units Subcutaneous Q4H  . ipratropium  0.5 mg Nebulization Q6H  . metoCLOPramide  10 mg Oral TID AC & HS  . midodrine  5 mg Oral TID WC  . multivitamin with minerals  1 tablet Oral Daily  . sodium chloride  3 mL Intravenous Q12H  . traZODone  150 mg Oral QHS   Continuous Infusions:   Principal Problem:   Acute on chronic systolic heart failure Active Problems:   OSA (obstructive sleep apnea)   Diabetes mellitus, type II   NSTEMI (non-ST elevated myocardial infarction)   Acute respiratory failure   Time spent 25 minutes

## 2013-03-10 NOTE — Progress Notes (Signed)
SUBJECTIVE: Feeling better breathing better.   Active Problems:   OSA (obstructive sleep apnea)   Diabetes mellitus, type II   Acute on chronic systolic heart failure   NSTEMI (non-ST elevated myocardial infarction)   Acute respiratory failure   LABS: Basic Metabolic Panel:  Recent Labs  04/54/09 0355 03/10/13 0437  NA 142 144  K 3.8 3.9  CL 101 100  CO2 32 35*  GLUCOSE 237* 129*  BUN 13 29*  CREATININE 0.99 1.04  CALCIUM 9.3 9.4   Liver Function Tests:  Recent Labs  03/09/13 0355 03/10/13 0437  AST 19 23  ALT 15 13  ALKPHOS 95 71  BILITOT 0.5 0.6  PROT 7.7 7.0  ALBUMIN 3.7 3.4*    Recent Labs  03/08/13 2316  LIPASE 13   CBC:  Recent Labs  03/08/13 2316 03/09/13 0355 03/10/13 0437  WBC 7.1 20.1* 11.2*  NEUTROABS 6.5  --   --   HGB 13.7 13.9 12.6*  HCT 43.1 44.2 41.4  MCV 90.2 90.6 93.5  PLT PLATELET CLUMPS NOTED ON SMEAR, COUNT APPEARS ADEQUATE 233 189   Cardiac Enzymes:  Recent Labs  03/09/13 0355 03/09/13 0911 03/09/13 1603  TROPONINI 1.06* 2.75* 3.65*   Echocardiogram 12/01/2012 Left ventricle: The cavity size was mildly dilated. There was mild to moderateconcentric hypertrophy. Systolic function was moderately reduced. The estimated ejection fraction was 35%. Severe hypokinesis of the entireinferior, inferoseptal, and apical myocardium. - Ventricular septum: Septal motion showed paradox. - Aortic valve: Mildly calcified annulus. - Mitral valve: A bioprosthesis was present and functioning normally. Valve area by pressure half-time: 1.83cm^2. - Left atrium: The atrium was moderately dilated. - Right atrium: The atrium was dilated. - Atrial septum: No defect or patent foramen ovale was identified.    RADIOLOGY: Dg Chest Port 1 View  03/08/2013   *RADIOLOGY REPORT*  Clinical Data: Shortness of breath  PORTABLE CHEST - 1 VIEW  Comparison: Prior radiograph from 02/17/2013  Findings: Cardiomegaly is grossly stable as compared to prior  radiation.  Median sternotomy wires are again noted.  The lungs are mildly hypoinflated.  There is been interval development of florid pulmonary edema seen throughout the lungs. There are likely small bilateral pleural effusions.  No pneumothorax.  No definite superimposed infiltrate is identified.  Osseous structures are unchanged.  IMPRESSION: Cardiomegaly with florid pulmonary edema.   Original Report Authenticated By: Rise Mu, M.D.     PHYSICAL EXAM BP 129/60  Pulse 77  Temp(Src) 97 F (36.1 C) (Axillary)  Resp 12  Ht 5\' 9"  (1.753 m)  Wt 251 lb 5.2 oz (114 kg)  BMI 37.1 kg/m2  SpO2 99%Wt Loss: 9 lbs from highest recorded wt.  General: Well developed, well nourished, in no acute distress, wearing CPAP Head: Eyes PERRLA, No xanthomas.   Normal cephalic and atramatic  Lungs: Clear bilaterally diminished bibasilar, scattered crackles. Heart: HRRR S1 S2, No MRG .  Pulses are 2+ & equal. No carotid bruit. No JVD.  Abdomen: Bowel sounds are positive, abdomen soft and non-tender without masses or                  Hernia's noted. Msk:  Back normal,  Normal strength and tone for age. Extremities: No clubbing, cyanosis or edema.  DP +1 Neuro: Alert and oriented X 3. Psych:  Good affect, responds appropriately  TELEMETRY: Reviewed telemetry pt in: LBBB, rate of 75 bpm.  ASSESSMENT AND PLAN:  1. Acute on Chronic Mixed CHF: EF of 35% She continues  to diurese well. He is responding to the diuretics with a total 9 pound weight loss from highest recorded weight. Lungs have bibasilar congestion, continued. Continue IV Lasix for at least 24 hours. Creatinine 1.04 from 0.99 yesterday. Blood pressure is labile, low as the 90s systolic to 153 systolic over the last 24 hours. He remains on carvedilol 3.125 mg twice a day. Would titrate up as he becomes more euvolemic. He is not on ACE inhibitor secondary to issues with hypotension. Continue to follow closely and may be able to institute  low-dose once he is stabilized.  With issues or chronic medical noncompliance, does not appear that he would be a candidate for an AICD at this time. Recommend establishment with CHF clinic in [place].  2. Hx of St. Jude Bioprosthetic Mitral Valve:  Per recent echo functioning appropriately.  3. OSA: Tolerating CPAP.  4. CAD: Nonobstructive for cardiac catheterization in February 2014. Continues chronic left bundle branch block. Troponin is rising, however from 1.06 on admission to 3.65 this am. Still thought to be related to hypoxia and not ischemia. LBBB pattern on EKG does not allow for clear evaluation of new event.     Bettey Mare. Lyman Bishop NP [facility] 03/10/2013, 7:39 AM

## 2013-03-11 ENCOUNTER — Telehealth: Payer: Self-pay | Admitting: *Deleted

## 2013-03-11 LAB — GLUCOSE, CAPILLARY
Glucose-Capillary: 145 mg/dL — ABNORMAL HIGH (ref 70–99)
Glucose-Capillary: 142 mg/dL — ABNORMAL HIGH (ref 70–99)
Glucose-Capillary: 152 mg/dL — ABNORMAL HIGH (ref 70–99)
Glucose-Capillary: 133 mg/dL — ABNORMAL HIGH (ref 70–99)
Glucose-Capillary: 158 mg/dL — ABNORMAL HIGH (ref 70–99)

## 2013-03-11 MED ORDER — FUROSEMIDE 40 MG PO TABS
40.0000 mg | ORAL_TABLET | Freq: Two times a day (BID) | ORAL | Status: DC
  Administered 2013-03-11 – 2013-03-12 (×2): 40 mg via ORAL
  Filled 2013-03-11 (×2): qty 1

## 2013-03-11 NOTE — Progress Notes (Signed)
Primary cardiologist: Dr. Prentice Docker  Subjective:   Up in chair getting breathing treatment. Feels better. No chest pain.   Objective:   Temp:  [97.6 F (36.4 C)-98.7 F (37.1 C)] 97.6 F (36.4 C) (09/04 0456) Pulse Rate:  [64-82] 82 (09/04 0456) Resp:  [16] 16 (09/04 0456) BP: (105-153)/(55-67) 147/55 mmHg (09/04 0456) SpO2:  [87 %-99 %] 98 % (09/04 0724) Weight:  [251 lb (113.853 kg)-254 lb 3.2 oz (115.304 kg)] 254 lb 3.2 oz (115.304 kg) (09/04 0456) Last BM Date: 03/09/13  Filed Weights   03/10/13 0500 03/10/13 2105 03/11/13 0456  Weight: 251 lb 5.2 oz (114 kg) 251 lb (113.853 kg) 254 lb 3.2 oz (115.304 kg)    Intake/Output Summary (Last 24 hours) at 03/11/13 1434 Last data filed at 03/11/13 0955  Gross per 24 hour  Intake    240 ml  Output   3650 ml  Net  -3410 ml    Exam:  General: Obese, chronically ill-appearing.  Lungs: Diminished with scattered rhonchi.  Cardiac: RRR, indistinct PMI, no gallop.  Abdomen: Protuberant.  Extremities: Improved edema.  Lab Results:  Basic Metabolic Panel:  Recent Labs Lab 03/08/13 2316 03/09/13 0355 03/10/13 0437  NA 142 142 144  K 4.4 3.8 3.9  CL 103 101 100  CO2 30 32 35*  GLUCOSE 227* 237* 129*  BUN 11 13 29*  CREATININE 0.86 0.99 1.04  CALCIUM 9.8 9.3 9.4    Liver Function Tests:  Recent Labs Lab 03/08/13 2316 03/09/13 0355 03/10/13 0437  AST 17 19 23   ALT 17 15 13   ALKPHOS 95 95 71  BILITOT 0.4 0.5 0.6  PROT 8.0 7.7 7.0  ALBUMIN 4.1 3.7 3.4*    CBC:  Recent Labs Lab 03/08/13 2316 03/09/13 0355 03/10/13 0437  WBC 7.1 20.1* 11.2*  HGB 13.7 13.9 12.6*  HCT 43.1 44.2 41.4  MCV 90.2 90.6 93.5  PLT PLATELET CLUMPS NOTED ON SMEAR, COUNT APPEARS ADEQUATE 233 189    Cardiac Enzymes:  Recent Labs Lab 03/09/13 0355 03/09/13 0911 03/09/13 1603  TROPONINI 1.06* 2.75* 3.65*    BNP:  Recent Labs  02/17/13 1418 02/18/13 0518 03/08/13 2316  PROBNP 4611.0* 1873.0* 1304.0*      Medications:   Scheduled Medications: . albuterol  2.5 mg Nebulization Q6H  . aspirin EC  81 mg Oral Daily  . carvedilol  3.125 mg Oral BID WC  . citalopram  20 mg Oral Daily  . clonazePAM  0.5 mg Oral TID  . enoxaparin (LOVENOX) injection  40 mg Subcutaneous Q24H  . furosemide  40 mg Intravenous Q12H  . insulin aspart  0-9 Units Subcutaneous TID WC  . ipratropium  0.5 mg Nebulization Q6H  . metoCLOPramide  10 mg Oral TID AC & HS  . multivitamin with minerals  1 tablet Oral Daily  . sodium chloride  3 mL Intravenous Q12H  . traZODone  150 mg Oral QHS      PRN Medications:  sodium chloride, albuterol, nitroGLYCERIN, ondansetron (ZOFRAN) IV, ondansetron, sodium chloride   Assessment:   1. Acute on chronic systolic heart failure, LVEF approximately 35%.  2. Nonischemic cardiomyopathy.  3. Type II NSTEMI.  4. St. Jude bioprosthetic mitral valve, appropriate function by recent echocardiographic assessment.  5. Nonobstructive CAD by cardiac catheterization in February of this year.   Plan/Discussion:    Plan to continue medical therapy. Switching to oral Lasix today. Otherwise on aspirin, low-dose Coreg. Consider adding low-dose ACE inhibitor next, perhaps as an  outpatient after further stabilized - has history of hypotension which may inhibit this somewhat. He will need close outpatient followup.   Jonelle Sidle, M.D., F.A.C.C.

## 2013-03-11 NOTE — Progress Notes (Signed)
[facility] PROGRESS NOTE  Cody Hale [name] DOB: 1950/12/03 DOA: 03/08/2013 PCP: Sanda Linger, MD Primary Cardiologist: Beulah Gandy  Assessment/Plan: 1. Acute respiratory failure with hypoxia, hypercapnia and respiratory acidosis: Quite stable but hypoxia persists. Presumably Secondary to acute heart failure. Wean oxygen as tolerated, will likely need home oxygen. 2. Acute on chronic systolic congestive heart failure: Continues to improve. Continue diuresis per cardiology. Ejection fraction 35%. History of noncompliance noted. Continue current medications per cardiology. Followup with CHF clinic in [place] as an outpatient. Will not adjust Coreg dose at this time, nor start ACEI's (Ramipril) but would consider this in the outpatient setting. He has had multiple hospital admissions for hypotension and over-diuresis. 3. Elevated troponin: Thought to be secondary to decompensated acute heart failure rather than ischemia. Recent catheterization demonstrating nonobstructive coronary artery disease. No further evaluation suggested by cardiology. 4. Abdominal pain, vomiting: Present on admission, resolved. No further investigation suggested at this point. 5. Diabetes mellitus type 2: Capillary blood sugars stable. Continue sliding scale insulin. 6. history of major depression 7. History of St. Jude bioprosthetic MV replacement in 09/2012 8. non-obstructive CAD per cath in 08/2012,systolic dysfunction with most recent EF in 11/2012 of 35%, 9. Suspected Obstructive sleep apnea: Consider outpatient sleep study. 10. Obesity unspecified    Continue management of heart failure per cardiology  Wean oxygen as tolerated.  Anticipate discharge 9/5.  Pending studies:   None  Code Status: Full DVT prophylaxis: Lovenox Family Communication: none present Disposition Plan: home when improved  Brendia Sacks, MD  [facility]  Pager [phone] If 7PM-7AM, please contact  night-coverage at [email or web address], password [facility] 03/11/2013, 11:19 AM  LOS: 62 days   Summary: 62 year old man with history of heart failure, diabetes, endocarditis, morbid obesity presented to the emergency department with a complaint of abdominal pain. He was hypoxic and respiratory status rapidly deteriorated requiring BiPAP in the emergency department. ABG revealed hypoxia, hypercapnia and acute respiratory acidosis.  admitted for acute systolic congestive heart failure and stabilization on BiPAP. He improved diuresis, subsequent troponins demonstrated elevation.   Consultants:  Cardiology  Physical therapy: Home health  Procedures:    Antibiotics:    HPI/Subjective: No complaints today. Symptomology suggesting sleep apnea was observed overnight. Patient does report he has been told he has sleep apnea. He has never had a sleep study. Breathing okay. No pain.  Objective: Filed Vitals:   03/10/13 2136 03/11/13 0151 03/11/13 0456 03/11/13 0724  BP:   147/55   Pulse: 65  82   Temp:   97.6 F (36.4 C)   TempSrc:   Axillary   Resp: 16  16   Height:      Weight:   115.304 kg (254 lb 3.2 oz)   SpO2: 93% 98% 87% 98%    Intake/Output Summary (Last 24 hours) at 03/11/13 1119 Last data filed at 03/11/13 0955  Gross per 24 hour  Intake    240 ml  Output   3650 ml  Net  -3410 ml     Filed Weights   03/10/13 0500 03/10/13 2105 03/11/13 0456  Weight: 114 kg (251 lb 5.2 oz) 113.853 kg (251 lb) 115.304 kg (254 lb 3.2 oz)    Exam:   Afebrile, vital signs stable.   General: Appears calm and comfortable.  Cardiovascular: Regular rate and rhythm. No murmur, rub, gallop.  Respiratory: Clear to auscultation bilaterally. No wheezes, rales, rhonchi. Normal respiratory effort.  Psychiatric: Grossly normal mood and affect. Speech fluent and appropriate.  Data Reviewed:  Good urine output  Scheduled Meds: . albuterol  2.5 mg Nebulization Q6H  . aspirin EC  81 mg Oral Daily  .  carvedilol  3.125 mg Oral BID WC  . citalopram  20 mg Oral Daily  . clonazePAM  0.5 mg Oral TID  . enoxaparin (LOVENOX) injection  40 mg Subcutaneous Q24H  . furosemide  40 mg Intravenous Q12H  . insulin aspart  0-9 Units Subcutaneous TID WC  . ipratropium  0.5 mg Nebulization Q6H  . metoCLOPramide  10 mg Oral TID AC & HS  . multivitamin with minerals  1 tablet Oral Daily  . sodium chloride  3 mL Intravenous Q12H  . traZODone  150 mg Oral QHS   Continuous Infusions:   Principal Problem:   Acute on chronic systolic heart failure Active Problems:   OSA (obstructive sleep apnea)   Diabetes mellitus, type II   NSTEMI (non-ST elevated myocardial infarction)   Acute respiratory failure   Time spent 15 minutes

## 2013-03-11 NOTE — Progress Notes (Signed)
Physical Therapy Treatment Patient Details Name: Cody Hale MRN: [id number] DOB: [birth year] Today's Date: 03/11/2013 Time: 4098-1191 PT Time Calculation (min): 28 min  PT Assessment / Plan / Recommendation  History of Present Illness     PT Comments   Upon initial entrance of therapist pt without any O2 assistance with O2 sat 80%.  Pt given O2 nasal canal and able to bring up O2 sat to 96% following cueing for diaphraphmatic breathing in less that 20 seconds.  Increased distance with gait training with appropriate gait mechanics, no LOB episodes.  Pt limited by fatigue following gait.  Pt left in chair with call bell within reach.  Follow Up Recommendations        Does the patient have the potential to tolerate intense rehabilitation     Barriers to Discharge        Equipment Recommendations       Recommendations for Other Services    Frequency     Progress towards PT Goals Progress towards PT goals: Progressing toward goals  Plan   Begin standing exercises next session.   Precautions / Restrictions Precautions Precautions: None Restrictions Weight Bearing Restrictions: No    Mobility  Bed Mobility Bed Mobility: Left Sidelying to Sit Left Sidelying to Sit: 5: Supervision (cueing for handplacement to assist) Transfers Transfers: Sit to Stand;Stand to Sit Sit to Stand: 7: Independent Stand to Sit: 7: Independent Details for Transfer Assistance: cueing for hand placement initially, demonstrated safe mechanics without cueing following Ambulation/Gait Ambulation/Gait Assistance: 7: Independent Ambulation Distance (Feet): 200 Feet Assistive device: None Ambulation/Gait Assistance Details: Utilized 3L O2 with gait this session, O2 sat at 91% following gait training Gait Pattern: Within Functional Limits Stairs: No    Exercises General Exercises - Lower Extremity Ankle Circles/Pumps: AROM;Both;10 reps Quad Sets: AROM;Both;10 reps Long Arc Quad: AROM;Both;10  reps;Seated Straight Leg Raises: AROM;Both;10 reps   PT Diagnosis:    PT Problem List:   PT Treatment Interventions:     PT Goals (current goals can now be found in the care plan section)    Visit Information  Last PT Received On: 03/11/13    Subjective Data      Cognition  Cognition Arousal/Alertness: Awake/alert Behavior During Therapy: [facility] for tasks assessed/performed Overall Cognitive Status: Within Functional Limits for tasks assessed    Balance     End of Session PT - End of Session Equipment Utilized During Treatment: Gait belt Activity Tolerance: Patient tolerated treatment well Patient left: in chair;with call bell/phone within reach   GP     Juel Burrow 03/11/2013, 9:49 AM

## 2013-03-11 NOTE — Telephone Encounter (Signed)
There is nothing I can about this I have never seen this person before Please forward this to the cardiology team

## 2013-03-11 NOTE — Telephone Encounter (Signed)
Cody Hale, pts wife called states pt is on inpatient at [facility] and is to be released.  She is concerned that pts discharge plan does not include O2 or Cpap, further states pt has been diagnosed with sleep apnea and requests he be transferred from [facility] to [facility].  Please advise

## 2013-03-12 ENCOUNTER — Encounter (HOSPITAL_COMMUNITY): Payer: BC Managed Care – PPO

## 2013-03-12 LAB — BASIC METABOLIC PANEL
BUN: 22 mg/dL (ref 6–23)
CO2: 39 mEq/L — ABNORMAL HIGH (ref 19–32)
Calcium: 9.6 mg/dL (ref 8.4–10.5)
Chloride: 96 mEq/L (ref 96–112)
Creatinine, Ser: 0.76 mg/dL (ref 0.50–1.35)
GFR calc Af Amer: >90 mL/min (ref >90)
GFR calc non Af Amer: >90 mL/min (ref >90)
Glucose, Bld: 142 mg/dL — ABNORMAL HIGH (ref 70–99)
Potassium: 3.8 mEq/L (ref 3.5–5.1)
Sodium: 141 mEq/L (ref 135–145)

## 2013-03-12 LAB — GLUCOSE, CAPILLARY
Glucose-Capillary: 141 mg/dL — ABNORMAL HIGH (ref 70–99)
Glucose-Capillary: 151 mg/dL — ABNORMAL HIGH (ref 70–99)

## 2013-03-12 MED ORDER — POTASSIUM CHLORIDE CRYS ER 20 MEQ PO TBCR: 20.0000 meq | EXTENDED_RELEASE_TABLET | Freq: Every day | ORAL | Status: DC

## 2013-03-12 MED ORDER — FUROSEMIDE 40 MG PO TABS: 40.0000 mg | ORAL_TABLET | Freq: Two times a day (BID) | ORAL | Status: DC

## 2013-03-12 NOTE — Plan of Care (Signed)
Problem: Discharge Progression Outcomes Goal: Other Discharge Outcomes/Goals Outcome: Completed/Met Date Met:  03/12/13 Pt discharged with home health

## 2013-03-12 NOTE — Progress Notes (Signed)
Patient ambulated in hall without oxygen, O2 sats while ambulating 94-99%, while resting 98%, after ambulating 96%.

## 2013-03-12 NOTE — Progress Notes (Signed)
UR chart review completed.  

## 2013-03-12 NOTE — Discharge Summary (Signed)
Physician Discharge Summary  Cody Hale WUJ:[id number] DOB: 1950-07-13 DOA: 03/08/2013  PCP: Jeoffrey Massed, MD  Admit date: 03/08/2013 Discharge date: 03/12/2013  Recommendations for Outpatient Follow-up:  1. Consider outpatient sleep study. Presumed obstructive sleep apnea, patient started empirically on BiPAP 15/5 each bedtime with no oxygen bleed in. 2. Followup chronic systolic congestive heart failure with history of noncompliance  3. Consider basic metabolic panel and follow 4. Home health RN, physical therapy   Follow-up Information   Follow up with Joni Reining, NP On 03/26/2013. (1:20 pm)    Specialty:  Nurse Practitioner   Contact information:   [address] [place] [address] [phone]       Follow up with Jeoffrey Massed, MD In 1 week.   Specialty:  Family Medicine   Contact information:   [address] [place] [address] [phone]      Discharge Diagnoses:  1. Acute respiratory failure with hypoxia, hypercapnia and respiratory acidosis 2. Acute on chronic systolic congestive heart failure 3. Elevated troponin thought secondary to decompensated acute heart failure 4. Suspected obstructive sleep apnea 5. Diabetes mellitus type 2  Discharge Condition: Improved Disposition: Home  Diet recommendation: Heart healthy, diabetic diet  Filed Weights   03/11/13 0456 03/12/13 0510 03/12/13 0900  Weight: 115.304 kg (254 lb 3.2 oz) 116.257 kg (256 lb 4.8 oz) 112.492 kg (248 lb)    History of present illness:  62 year old man with history of heart failure, diabetes, endocarditis, morbid obesity presented to the emergency department with a complaint of abdominal pain. He was hypoxic and respiratory status rapidly deteriorated requiring BiPAP in the emergency department. ABG revealed hypoxia, hypercapnia and acute respiratory acidosis. admitted for acute systolic congestive heart failure and stabilization on BiPAP. He improved diuresis,  subsequent troponins demonstrated elevation.   Hospital Course:  Cody Hale was admitted for acute respiratory failure requiring BiPAP secondary to acute heart failure. He rapidly improved with diuresis. He was seen in consultation with cardiology. Elevated troponins were thought to be secondary to demand ischemia. Noncompliance was felt to be likely in regard to precipitating etiology. Hospitalization was relatively uncomplicated, sign suggestive of sleep apnea were observed during hospitalization. Patient reported he had been told in the past he had sleep apnea. He was started empirically on BiPAP, outpatient sleep study is recommended. Hypoxia is resolved, he is ambulated in the hall without difficulty and is now stable for discharge. Individual issues as below.  1. Acute respiratory failure with hypoxia, hypercapnia and respiratory acidosis: Resolved, no hypoxia. Successfully weaned off oxygen. Presumably Secondary to acute heart failure.  2. Acute on chronic systolic congestive heart failure: Appears well compensated at this point. Ejection fraction 35%. History of noncompliance noted. He has had multiple hospital admissions for hypotension and over-diuresis. Consider adding low-dose ACE inhibitor next, perhaps as an outpatient after further stabilized - has history of hypotension which may inhibit this somewhat. Despite high-dose Lasix therapy he has not required any potassium supplementation. 3. Elevated troponin: Thought to be secondary to decompensated acute heart failure rather than ischemia. Recent catheterization demonstrating nonobstructive coronary artery disease. No further evaluation suggested by cardiology. 4. Abdominal pain, vomiting: Present on admission, resolved. No further investigation suggested at this point. 5. Diabetes mellitus type 2: Capillary blood sugars stable. Continue sliding scale insulin. 6. history of major depression 7. History of St. Jude bioprosthetic MV replacement  in 09/2012 8. non-obstructive CAD per cath in 08/2012,systolic dysfunction with most recent EF in 11/2012 of 35%, 9.  Suspected Obstructive sleep apnea: Consider outpatient sleep study. Empiric BiPAP as below. 10. Obesity unspecified   Consultants:  Cardiology  Physical therapy: Home health Procedures: None  Discharge Instructions  Discharge Orders   Future Appointments Provider Department Dept Phone   03/15/2013 9:30 AM [facility] [place] [facility] [phone]   03/17/2013 9:30 AM [facility] [place] [facility] [phone]   03/19/2013 9:30 AM [facility] [place] [facility] [phone]   03/22/2013 9:30 AM [facility] [place] [facility] [phone]   03/24/2013 9:30 AM [facility] [place] [facility] [phone]   03/26/2013 9:30 AM [facility] [place] [facility] [phone]   03/26/2013 1:20 PM Jodelle Gross, NP [facility] [place] [phone]   03/29/2013 9:30 AM [facility] [place] [facility] [phone]   03/31/2013 9:30 AM [facility] [place] [facility] [phone]   04/02/2013 9:30 AM [facility] [place] [facility] [phone]   04/05/2013 9:30 AM [facility] 2 [place] [facility] [phone]   04/07/2013 9:30 AM [facility] [place] [facility] [phone]   04/09/2013 9:30 AM [facility] [place] [facility] [phone]   04/12/2013 9:30 AM [facility] [place] [facility] [phone]   04/14/2013 9:30 AM [facility] [place] [facility] [phone]   04/16/2013 9:30 AM [facility] [place] [facility] [phone]   06/28/2013 1:00 PM Laqueta Linden, MD [facility] [place] [phone]   Future Orders Complete By Expires   Diet - low  sodium heart healthy  As directed    Diet Carb Modified  As directed    Discharge instructions  As directed    Comments:     Be sure to weigh yourself daily. Call your cardiologist for rapid weight gain. Call your physician or seek immediate medical assistance for shortness of breath, chest pain or worsening of condition.   Increase activity slowly  As directed        Medication List    STOP taking these medications       midodrine 5 MG tablet  Commonly known as:  PROAMATINE      TAKE these medications       albuterol (5 MG/ML) 0.5% nebulizer solution  Commonly known as:  PROVENTIL  Take 0.5 mL (2.5 mg total) by nebulization every 2 (two) hours as needed for wheezing.     aspirin 81 MG tablet  Take 81 mg by mouth daily.     carvedilol 3.125 MG tablet  Commonly known as:  COREG  Take 1 tablet (3.125 mg total) by mouth 2 (two) times daily with a meal.     citalopram 20 MG tablet  Commonly known as:  CELEXA  Take 1 tablet (20 mg total) by mouth daily.     clonazePAM 0.5 MG tablet  Commonly known as:  KLONOPIN  Take 1 tablet (0.5 mg total) by mouth 3 (three) times daily.     FISH OIL PO  Take 1 capsule by mouth daily.     furosemide 40 MG tablet  Commonly known as:  LASIX  Take 1 tablet (40 mg total) by mouth 2 (two) times daily.     glipiZIDE 10 MG tablet  Commonly known as:  GLUCOTROL  Take 10 mg by mouth daily.     metoCLOPramide 10 MG tablet  Commonly known as:  REGLAN  Take 1 tablet (10 mg  total) by mouth 4 (four) times daily -  before meals and at bedtime. To prevent vomiting     multivitamin with minerals Tabs tablet  Take 1 tablet by mouth daily.     nitroGLYCERIN 0.4 MG SL tablet  Commonly known as:  NITROSTAT  Place 1 tablet (0.4 mg total) under the tongue every 5 (five) minutes x 3 doses as needed for chest pain.     traZODone 150 MG tablet  Commonly known as:  DESYREL  Take 1 tablet (150 mg total) by mouth at bedtime.       Allergies   Allergen Reactions  . Daptomycin Rash  . Tape Rash and Other (See Comments)    Adhesive Tape-Burn skin.    The results of significant diagnostics from this hospitalization (including imaging, microbiology, ancillary and laboratory) are listed below for reference.    Significant Diagnostic Studies: Dg Chest Port 1 View  03/10/2013   *RADIOLOGY REPORT*  Clinical Data: Congestive heart failure.  PORTABLE CHEST - 1 VIEW  Comparison: 03/08/2013.  Findings: Patient is slightly rotated.  Heart size stable.  Mild diffuse interstitial prominence and indistinctness.  No definite pleural fluid.  Degenerative changes are seen in the acromioclavicular joints.  IMPRESSION: Mild edema, improved from 03/08/2013.   Original Report Authenticated By: Leanna Battles, M.D.   Dg Chest Port 1 View  03/08/2013   *RADIOLOGY REPORT*  Clinical Data: Shortness of breath  PORTABLE CHEST - 1 VIEW  Comparison: Prior radiograph from 02/17/2013  Findings: Cardiomegaly is grossly stable as compared to prior radiation.  Median sternotomy wires are again noted.  The lungs are mildly hypoinflated.  There is been interval development of florid pulmonary edema seen throughout the lungs. There are likely small bilateral pleural effusions.  No pneumothorax.  No definite superimposed infiltrate is identified.  Osseous structures are unchanged.  IMPRESSION: Cardiomegaly with florid pulmonary edema.   Original Report Authenticated By: Rise Mu, M.D.     Microbiology: Recent Results (from the past 240 hour(s))  MRSA PCR SCREENING     Status: None   Collection Time    03/09/13  2:45 AM      Result Value Range Status   MRSA by PCR NEGATIVE  NEGATIVE Final   Comment:            The GeneXpert MRSA Assay (FDA     approved for NASAL specimens     only), is one component of a     comprehensive MRSA colonization     surveillance program. It is not     intended to diagnose MRSA     infection nor to guide or     monitor  treatment for     MRSA infections.     Labs: Basic Metabolic Panel:  Recent Labs Lab 03/08/13 2316 03/09/13 0355 03/10/13 0437 03/12/13 0454  NA 142 142 144 141  K 4.4 3.8 3.9 3.8  CL 103 101 100 96  CO2 30 32 35* 39*  GLUCOSE 227* 237* 129* 142*  BUN 11 13 29* 22  CREATININE 0.86 0.99 1.04 0.76  CALCIUM 9.8 9.3 9.4 9.6   Liver Function Tests:  Recent Labs Lab 03/08/13 2316 03/09/13 0355 03/10/13 0437  AST 17 19 23   ALT 17 15 13   ALKPHOS 95 95 71  BILITOT 0.4 0.5 0.6  PROT 8.0 7.7 7.0  ALBUMIN 4.1 3.7 3.4*    Recent Labs Lab 03/08/13 2316  LIPASE 13   CBC:  Recent Labs Lab 03/08/13 2316  03/09/13 0355 03/10/13 0437  WBC 7.1 20.1* 11.2*  NEUTROABS 6.5  --   --   HGB 13.7 13.9 12.6*  HCT 43.1 44.2 41.4  MCV 90.2 90.6 93.5  PLT PLATELET CLUMPS NOTED ON SMEAR, COUNT APPEARS ADEQUATE 233 189   Cardiac Enzymes:  Recent Labs Lab 03/08/13 2316 03/09/13 0355 03/09/13 0911 03/09/13 1603  TROPONINI <0.30 1.06* 2.75* 3.65*     Recent Labs  02/17/13 1418 02/18/13 0518 03/08/13 2316  PROBNP 4611.0* 1873.0* 1304.0*   CBG:  Recent Labs Lab 03/11/13 1132 03/11/13 1636 03/11/13 2157 03/12/13 0723 03/12/13 1137  GLUCAP 152* 133* 158* 141* 151*    Principal Problem:   Acute on chronic systolic heart failure Active Problems:   OSA (obstructive sleep apnea)   Diabetes mellitus, type II   NSTEMI (non-ST elevated myocardial infarction)   Acute respiratory failure   Time coordinating discharge: 35 minutes  Signed:  Brendia Sacks, MD [facility] 03/12/2013, 12:25 PM

## 2013-03-12 NOTE — Progress Notes (Signed)
Subjective:Denies SOB   No CP Objective: Filed Vitals:   03/11/13 2159 03/12/13 0232 03/12/13 0510 03/12/13 0727  BP: 137/67  129/75   Pulse: 60  59 63  Temp: 97.7 F (36.5 C)  97.4 F (36.3 C)   TempSrc: Oral  Axillary   Resp: 18  18   Height:      Weight:   256 lb 4.8 oz (116.257 kg)   SpO2: 97% 97% 92% 94%   Weight change: 5 lb 4.8 oz (2.404 kg)  Intake/Output Summary (Last 24 hours) at 03/12/13 0906 Last data filed at 03/12/13 9528  Gross per 24 hour  Intake    600 ml  Output   2800 ml  Net  -2200 ml   Net neg:  7.3 L  General: Alert, awake, oriented x3, in no acute distress Neck:  JVP is normal  Neck full Heart: Regular rate and rhythm, without murmurs, rubs, gallops.  Lungs: Clear to auscultation.  No rales or wheezes. Exemities:  No edema.   Neuro: Grossly intact, nonfocal.  Tele:  SR   Lab Results: Results for orders placed during the hospital encounter of 03/08/13 (from the past 24 hour(s))  GLUCOSE, CAPILLARY     Status: Abnormal   Collection Time    03/11/13 11:32 AM      Result Value Range   Glucose-Capillary 152 (*) 70 - 99 mg/dL   Comment 1 Notify RN    GLUCOSE, CAPILLARY     Status: Abnormal   Collection Time    03/11/13  4:36 PM      Result Value Range   Glucose-Capillary 133 (*) 70 - 99 mg/dL  GLUCOSE, CAPILLARY     Status: Abnormal   Collection Time    03/11/13  9:57 PM      Result Value Range   Glucose-Capillary 158 (*) 70 - 99 mg/dL   Comment 1 Notify RN    BASIC METABOLIC PANEL     Status: Abnormal   Collection Time    03/12/13  4:54 AM      Result Value Range   Sodium 141  135 - 145 mEq/L   Potassium 3.8  3.5 - 5.1 mEq/L   Chloride 96  96 - 112 mEq/L   CO2 39 (*) 19 - 32 mEq/L   Glucose, Bld 142 (*) 70 - 99 mg/dL   BUN 22  6 - 23 mg/dL   Creatinine, Ser 4.13  0.50 - 1.35 mg/dL   Calcium 9.6  8.4 - 24.4 mg/dL   GFR calc non Af Amer >90  >90 mL/min   GFR calc Af Amer >90  >90 mL/min  GLUCOSE, CAPILLARY     Status: Abnormal   Collection Time    03/12/13  7:23 AM      Result Value Range   Glucose-Capillary 141 (*) 70 - 99 mg/dL    Studies/Results: @RISRSLT24 @  Medications: reviewed   @PROBHOSP @  1.  Acute on chronic systolic CHF  LVEF 35%  Volume status looks OK  Patient will ambulate and check O2 sats  2.  Type II NSTEMI  Continue medical Rx.  3.  St Jude MV bioprosthesis.  4.  Nonobstructive CAD  5.  LBBB  6.  Sleep apnea  Patient reported to go home with CPAP until gets formal study    LOS: 4 days   Dietrich Pates 03/12/2013, 9:06 AM

## 2013-03-12 NOTE — Progress Notes (Signed)
[facility] PROGRESS NOTE  Cody Hale ZOX:[id number] DOB: [birth year] DOA: 03/08/2013 PCP: Sanda Linger, MD Primary Cardiologist: Beulah Gandy  Assessment/Plan: 1. Acute respiratory failure with hypoxia, hypercapnia and respiratory acidosis: Resolved, no hypoxia. Successfully weaned off oxygen. Presumably Secondary to acute heart failure.  2. Acute on chronic systolic congestive heart failure: Appears well compensated at this point. Ejection fraction 35%. History of noncompliance noted. He has had multiple hospital admissions for hypotension and over-diuresis. Consider adding low-dose ACE inhibitor next, perhaps as an outpatient after further stabilized - has history of hypotension which may inhibit this somewhat. 3. Elevated troponin: Thought to be secondary to decompensated acute heart failure rather than ischemia. Recent catheterization demonstrating nonobstructive coronary artery disease. No further evaluation suggested by cardiology. 4. Abdominal pain, vomiting: Present on admission, resolved. No further investigation suggested at this point. 5. Diabetes mellitus type 2: Capillary blood sugars stable. Continue sliding scale insulin. 6. history of major depression 7. History of St. Jude bioprosthetic MV replacement in 09/2012 8. non-obstructive CAD per cath in 08/2012,systolic dysfunction with most recent EF in 11/2012 of 35%, 9. Suspected Obstructive sleep apnea: Consider outpatient sleep study. Empiric BiPAP as below. 10. Obesity unspecified    Empiric BiPAP 15/5 as recommended by respiratory, no oxygen bleed in, outpatient sleep study recommended  Followup with cardiology as an outpatient  Stable for discharge home today  Pending studies:   None  Code Status: Full DVT prophylaxis: Lovenox Family Communication: Discussed with wife at bedside. Disposition Plan: home   Brendia Sacks, MD  [facility]  Pager [phone] If 7PM-7AM, please contact night-coverage at  [email or web address], password [facility] 03/12/2013, 12:15 PM  LOS: 4 days   Summary: 62 year old man with history of heart failure, diabetes, endocarditis, morbid obesity presented to the emergency department with a complaint of abdominal pain. He was hypoxic and respiratory status rapidly deteriorated requiring BiPAP in the emergency department. ABG revealed hypoxia, hypercapnia and acute respiratory acidosis.  admitted for acute systolic congestive heart failure and stabilization on BiPAP. He improved diuresis, subsequent troponins demonstrated elevation.   Consultants:  Cardiology  Physical therapy: Home health  Procedures:    Antibiotics:    HPI/Subjective: Doing well. No complaints. He stimulated to have difficulty. Oxygen saturation on room air with ambulation reportedly 97%. Breathing well. No complaints. Tolerated BiPAP last night well.  Objective: Filed Vitals:   03/12/13 0232 03/12/13 0510 03/12/13 0727 03/12/13 0900  BP:  129/75    Pulse:  59 63   Temp:  97.4 F (36.3 C)    TempSrc:  Axillary    Resp:  18    Height:      Weight:  116.257 kg (256 lb 4.8 oz)  112.492 kg (248 lb)  SpO2: 97% 92% 94%     Intake/Output Summary (Last 24 hours) at 03/12/13 1215 Last data filed at 03/12/13 1143  Gross per 24 hour  Intake    480 ml  Output   2725 ml  Net  -2245 ml     Filed Weights   03/11/13 0456 03/12/13 0510 03/12/13 0900  Weight: 115.304 kg (254 lb 3.2 oz) 116.257 kg (256 lb 4.8 oz) 112.492 kg (248 lb)    Exam:   Afebrile, vital signs stable.   General: Sitting in chair. Appears calm and comfortable.  Cardiovascular: Regular rate and rhythm. No murmur, rub, gallop. No significant lower extremity edema.  Respiratory: Clear to auscultation bilaterally. No wheezes, rales, rhonchi. Normal respiratory effort.  Data Reviewed:  Excellent urine output. Weight loss  as documented by cardiology.  Capillary blood sugars stable  Basic metabolic panel  stable.  Scheduled Meds: . albuterol  2.5 mg Nebulization Q6H  . aspirin EC  81 mg Oral Daily  . carvedilol  3.125 mg Oral BID WC  . citalopram  20 mg Oral Daily  . clonazePAM  0.5 mg Oral TID  . enoxaparin (LOVENOX) injection  40 mg Subcutaneous Q24H  . furosemide  40 mg Oral BID  . insulin aspart  0-9 Units Subcutaneous TID WC  . ipratropium  0.5 mg Nebulization Q6H  . metoCLOPramide  10 mg Oral TID AC & HS  . multivitamin with minerals  1 tablet Oral Daily  . sodium chloride  3 mL Intravenous Q12H  . traZODone  150 mg Oral QHS   Continuous Infusions:   Principal Problem:   Acute on chronic systolic heart failure Active Problems:   OSA (obstructive sleep apnea)   Diabetes mellitus, type II   NSTEMI (non-ST elevated myocardial infarction)   Acute respiratory failure

## 2013-03-12 NOTE — Progress Notes (Deleted)
SUBJECTIVE:Feels good. To be discharged today.  Principal Problem:   Acute on chronic systolic heart failure Active Problems:   OSA (obstructive sleep apnea)   Diabetes mellitus, type II   NSTEMI (non-ST elevated myocardial infarction)   Acute respiratory failure   LABS: Basic Metabolic Panel:  Recent Labs  45/40/98 0437 03/12/13 0454  NA 144 141  K 3.9 3.8  CL 100 96  CO2 35* 39*  GLUCOSE 129* 142*  BUN 29* 22  CREATININE 1.04 0.76  CALCIUM 9.4 9.6   Liver Function Tests:  Recent Labs  03/10/13 0437  AST 23  ALT 13  ALKPHOS 71  BILITOT 0.6  PROT 7.0  ALBUMIN 3.4*   CBC:  Recent Labs  03/10/13 0437  WBC 11.2*  HGB 12.6*  HCT 41.4  MCV 93.5  PLT 189   Cardiac Enzymes:  Recent Labs  03/09/13 1603  TROPONINI 3.65*   Echocardiogram: 12/01/2012 Left ventricle: The cavity size was mildly dilated. There was mild to moderateconcentric hypertrophy. Systolic function was moderately reduced. The estimated ejection fraction was 35%. Severe hypokinesis of the entireinferior, inferoseptal, and apical myocardium. - Ventricular septum: Septal motion showed paradox. - Aortic valve: Mildly calcified annulus. - Mitral valve: A bioprosthesis was present and functioning normally. Valve area by pressure half-time: 1.83cm^2. - Left atrium: The atrium was moderately dilated. - Right atrium: The atrium was dilated. - Atrial septum: No defect or patent foramen ovale was identified.    PHYSICAL EXAM BP 129/75  Pulse 63  Temp(Src) 97.4 F (36.3 C) (Axillary)  Resp 18  Ht 5\' 9"  (1.753 m)  Wt 248 lb (112.492 kg)  BMI 36.61 kg/m2  SpO2 94% Wt loss 12 lbs since highest recorded.  General: Well developed, well nourished, in no acute distress Head: Eyes PERRLA, No xanthomas.   Normal cephalic and atramatic  Lungs: Clear bilaterally to auscultation diminished bibasilar.  Heart: HRRR S1 S2, No MRG .  Pulses are 2+ & equal.            No carotid bruit. No JVD.   Abdomen: Bowel sounds are positive, abdomen soft and non-tender without masses or                  Hernia's noted. Msk:  Back normal, normal gait. Diminished overall  strength and tone for age. Extremities: Positive for clubbing, no cyanosis or edema.  DP +1 Neuro: Alert and oriented X 3. Psych:  Good affect, responds appropriately  TELEMETRY: Reviewed telemetry pt in: SR with LBBB.  ASSESSMENT AND PLAN:  1.Acute on Chronic Systolic CHF:  LVEF of 35% with 12 lb wt loss. He has been up walking in the halls with O2 and feeling well. No complaints of DOE. He has been transitioned to PO lasix this am. I have made follow up appt for pt in 2 weeks with our office for close follow up. Wife is concerned that he will go home without O2. Requests that he have this.   2. NSTEMI Type II:  No further complaints. Troponin's   Cody Hale. Cody Bishop NP [facility] 03/12/2013, 9:49 AM

## 2013-03-15 ENCOUNTER — Encounter (HOSPITAL_COMMUNITY): Payer: BC Managed Care – PPO

## 2013-03-16 ENCOUNTER — Telehealth: Payer: Self-pay | Admitting: Internal Medicine

## 2013-03-16 ENCOUNTER — Other Ambulatory Visit (HOSPITAL_COMMUNITY): Payer: Self-pay

## 2013-03-16 NOTE — Telephone Encounter (Signed)
yes

## 2013-03-16 NOTE — Telephone Encounter (Signed)
Sharen Hint from [facility] called while at the home of Mr. Kessinger to set up an appointment.  I see where the original request to switch was to [facility].  Mr Savage thought it was with Dr. Yetta Barre.  He has appointments which were cancelled due to hospitalization.  Will you accept him as a New Patient transfer from Dr. Milinda Cave?

## 2013-03-17 ENCOUNTER — Encounter (HOSPITAL_COMMUNITY): Payer: Self-pay | Admitting: *Deleted

## 2013-03-17 ENCOUNTER — Encounter (HOSPITAL_COMMUNITY)
Admission: RE | Admit: 2013-03-17 | Discharge: 2013-03-17 | Disposition: A | Discharge status: Home / Self Care | Payer: BC Managed Care – PPO | Source: Ambulatory Visit | Attending: Cardiology | Admitting: Cardiology

## 2013-03-17 ENCOUNTER — Emergency Department (HOSPITAL_COMMUNITY)
Admission: EM | Admit: 2013-03-17 | Discharge: 2013-03-17 | Disposition: A | Discharge status: Home / Self Care | Payer: BC Managed Care – PPO | Attending: Emergency Medicine | Admitting: Emergency Medicine

## 2013-03-17 DIAGNOSIS — Z87442 Personal history of urinary calculi: Secondary | ICD-10-CM | POA: Insufficient documentation

## 2013-03-17 DIAGNOSIS — L0201 Cutaneous abscess of face: Secondary | ICD-10-CM | POA: Insufficient documentation

## 2013-03-17 DIAGNOSIS — E119 Type 2 diabetes mellitus without complications: Secondary | ICD-10-CM | POA: Insufficient documentation

## 2013-03-17 DIAGNOSIS — L03211 Cellulitis of face: Secondary | ICD-10-CM | POA: Insufficient documentation

## 2013-03-17 DIAGNOSIS — Z8739 Personal history of other diseases of the musculoskeletal system and connective tissue: Secondary | ICD-10-CM | POA: Insufficient documentation

## 2013-03-17 DIAGNOSIS — I059 Rheumatic mitral valve disease, unspecified: Secondary | ICD-10-CM | POA: Insufficient documentation

## 2013-03-17 DIAGNOSIS — I5022 Chronic systolic (congestive) heart failure: Secondary | ICD-10-CM | POA: Insufficient documentation

## 2013-03-17 DIAGNOSIS — I2510 Atherosclerotic heart disease of native coronary artery without angina pectoris: Secondary | ICD-10-CM | POA: Insufficient documentation

## 2013-03-17 DIAGNOSIS — I5032 Chronic diastolic (congestive) heart failure: Secondary | ICD-10-CM | POA: Insufficient documentation

## 2013-03-17 DIAGNOSIS — L0291 Cutaneous abscess, unspecified: Secondary | ICD-10-CM

## 2013-03-17 DIAGNOSIS — Z5189 Encounter for other specified aftercare: Secondary | ICD-10-CM | POA: Insufficient documentation

## 2013-03-17 DIAGNOSIS — F332 Major depressive disorder, recurrent severe without psychotic features: Secondary | ICD-10-CM | POA: Insufficient documentation

## 2013-03-17 DIAGNOSIS — Z79899 Other long term (current) drug therapy: Secondary | ICD-10-CM | POA: Insufficient documentation

## 2013-03-17 DIAGNOSIS — Z7982 Long term (current) use of aspirin: Secondary | ICD-10-CM | POA: Insufficient documentation

## 2013-03-17 DIAGNOSIS — Z87891 Personal history of nicotine dependence: Secondary | ICD-10-CM | POA: Insufficient documentation

## 2013-03-17 DIAGNOSIS — I10 Essential (primary) hypertension: Secondary | ICD-10-CM | POA: Insufficient documentation

## 2013-03-17 DIAGNOSIS — R197 Diarrhea, unspecified: Secondary | ICD-10-CM | POA: Insufficient documentation

## 2013-03-17 DIAGNOSIS — F411 Generalized anxiety disorder: Secondary | ICD-10-CM | POA: Insufficient documentation

## 2013-03-17 DIAGNOSIS — Z87440 Personal history of urinary (tract) infections: Secondary | ICD-10-CM | POA: Insufficient documentation

## 2013-03-17 LAB — GLUCOSE, CAPILLARY: Glucose-Capillary: 109 mg/dL — ABNORMAL HIGH (ref 70–99)

## 2013-03-17 MED ORDER — SULFAMETHOXAZOLE-TRIMETHOPRIM 800-160 MG PO TABS: 1.0000 | ORAL_TABLET | Freq: Two times a day (BID) | ORAL | Status: DC

## 2013-03-17 NOTE — Telephone Encounter (Signed)
LMOM to call for an appt. °
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
## 2013-03-17 NOTE — ED Notes (Signed)
Pt with redness and swelling to nose from CPAP mask, first noticed Friday, pt states he has diabetes, also recent admission for CHF

## 2013-03-18 NOTE — Telephone Encounter (Signed)
Wife came in and made an appt for Sept 12.

## 2013-03-19 ENCOUNTER — Other Ambulatory Visit (INDEPENDENT_AMBULATORY_CARE_PROVIDER_SITE_OTHER): Payer: BC Managed Care – PPO

## 2013-03-19 ENCOUNTER — Encounter (HOSPITAL_COMMUNITY): Payer: BC Managed Care – PPO

## 2013-03-19 ENCOUNTER — Ambulatory Visit (INDEPENDENT_AMBULATORY_CARE_PROVIDER_SITE_OTHER): Payer: BC Managed Care – PPO | Admitting: Internal Medicine

## 2013-03-19 ENCOUNTER — Encounter: Payer: Self-pay | Admitting: Internal Medicine

## 2013-03-19 ENCOUNTER — Ambulatory Visit (INDEPENDENT_AMBULATORY_CARE_PROVIDER_SITE_OTHER): Payer: BC Managed Care – PPO | Admitting: Psychiatry

## 2013-03-19 VITALS — BP 132/68 | HR 66 | Temp 98.7°F | Resp 16 | Ht 71.0 in | Wt 250.0 lb

## 2013-03-19 DIAGNOSIS — I10 Essential (primary) hypertension: Secondary | ICD-10-CM

## 2013-03-19 DIAGNOSIS — F331 Major depressive disorder, recurrent, moderate: Secondary | ICD-10-CM

## 2013-03-19 DIAGNOSIS — M1990 Unspecified osteoarthritis, unspecified site: Secondary | ICD-10-CM | POA: Insufficient documentation

## 2013-03-19 DIAGNOSIS — E785 Hyperlipidemia, unspecified: Secondary | ICD-10-CM

## 2013-03-19 DIAGNOSIS — D510 Vitamin B12 deficiency anemia due to intrinsic factor deficiency: Secondary | ICD-10-CM

## 2013-03-19 DIAGNOSIS — Z0000 Encounter for general adult medical examination without abnormal findings: Secondary | ICD-10-CM

## 2013-03-19 DIAGNOSIS — M169 Osteoarthritis of hip, unspecified: Secondary | ICD-10-CM

## 2013-03-19 DIAGNOSIS — Z23 Encounter for immunization: Secondary | ICD-10-CM

## 2013-03-19 DIAGNOSIS — E1129 Type 2 diabetes mellitus with other diabetic kidney complication: Secondary | ICD-10-CM

## 2013-03-19 DIAGNOSIS — B356 Tinea cruris: Secondary | ICD-10-CM

## 2013-03-19 LAB — BASIC METABOLIC PANEL
BUN: 11 mg/dL (ref 6–23)
CO2: 32 mEq/L (ref 19–32)
Calcium: 9 mg/dL (ref 8.4–10.5)
Chloride: 100 mEq/L (ref 96–112)
Creatinine, Ser: 0.9 mg/dL (ref 0.4–1.5)
GFR: 94.38 mL/min (ref >60.00)
Glucose, Bld: 121 mg/dL — ABNORMAL HIGH (ref 70–99)
Potassium: 3.6 mEq/L (ref 3.5–5.1)
Sodium: 138 mEq/L (ref 135–145)

## 2013-03-19 LAB — HEMOGLOBIN A1C: Hgb A1c MFr Bld: 6.3 % (ref 4.6–6.5)

## 2013-03-19 LAB — LIPID PANEL
Cholesterol: 138 mg/dL (ref 0–200)
HDL: 35.6 mg/dL — ABNORMAL LOW (ref >39.00)
LDL Cholesterol: 72 mg/dL (ref 0–99)
Total CHOL/HDL Ratio: 4
Triglycerides: 153 mg/dL — ABNORMAL HIGH (ref 0.0–149.0)
VLDL: 30.6 mg/dL (ref 0.0–40.0)

## 2013-03-19 LAB — FECAL OCCULT BLOOD, GUAIAC: Fecal Occult Blood: NEGATIVE

## 2013-03-19 LAB — TSH: TSH: 0.39 u[IU]/mL (ref 0.35–5.50)

## 2013-03-19 LAB — IBC PANEL
Iron: 57 ug/dL (ref 42–165)
Saturation Ratios: 14.1 % — ABNORMAL LOW (ref 20.0–50.0)
Transferrin: 289.2 mg/dL (ref 212.0–360.0)

## 2013-03-19 LAB — FERRITIN: Ferritin: 34.6 ng/mL (ref 22.0–322.0)

## 2013-03-19 LAB — VITAMIN B12: Vitamin B-12: 252 pg/mL (ref 211–911)

## 2013-03-19 LAB — FOLATE: Folate: 18.2 ng/mL (ref >5.9)

## 2013-03-19 MED ORDER — TRAMADOL HCL 50 MG PO TABS: 50.0000 mg | ORAL_TABLET | Freq: Three times a day (TID) | ORAL | Status: DC | PRN

## 2013-03-19 MED ORDER — GLUCOSE BLOOD VI STRP: ORAL_STRIP | Status: DC

## 2013-03-19 MED ORDER — ROSUVASTATIN CALCIUM 10 MG PO TABS: 10.0000 mg | ORAL_TABLET | Freq: Every day | ORAL | Status: DC

## 2013-03-19 MED ORDER — RELION LANCETS ULTRA-THIN 30G MISC: Status: DC

## 2013-03-19 MED ORDER — KETOCONAZOLE 2 % EX CREA: TOPICAL_CREAM | Freq: Two times a day (BID) | CUTANEOUS | Status: DC

## 2013-03-19 NOTE — Patient Instructions (Signed)
Type 2 Diabetes Mellitus, Adult Type 2 diabetes mellitus, often simply referred to as type 2 diabetes, is a long-lasting (chronic) disease. In type 2 diabetes, the pancreas does not make enough insulin (a hormone), the cells are less responsive to the insulin that is made (insulin resistance), or both. Normally, insulin moves sugars from food into the tissue cells. The tissue cells use the sugars for energy. The lack of insulin or the lack of normal response to insulin causes excess sugars to build up in the blood instead of going into the tissue cells. As a result, high blood sugar (hyperglycemia) develops. The effect of high sugar (glucose) levels can cause many complications. Type 2 diabetes was also previously called adult-onset diabetes but it can occur at any age.  RISK FACTORS  A person is predisposed to developing type 2 diabetes if someone in the family has the disease and also has one or more of the following primary risk factors:  Overweight.  An inactive lifestyle.  A history of consistently eating high-calorie foods. Maintaining a normal weight and regular physical activity can reduce the chance of developing type 2 diabetes. SYMPTOMS  A person with type 2 diabetes may not show symptoms initially. The symptoms of type 2 diabetes appear slowly. The symptoms include:  Increased thirst (polydipsia).  Increased urination (polyuria).  Increased urination during the night (nocturia).  Weight loss. This weight loss may be rapid.  Frequent, recurring infections.  Tiredness (fatigue).  Weakness.  Vision changes, such as blurred vision.  Fruity smell to your breath.  Abdominal pain.  Nausea or vomiting.  Cuts or bruises which are slow to heal.  Tingling or numbness in the hands or feet. DIAGNOSIS Type 2 diabetes is frequently not diagnosed until complications of diabetes are present. Type 2 diabetes is diagnosed when symptoms or complications are present and when blood  glucose levels are increased. Your blood glucose level may be checked by one or more of the following blood tests:  A fasting blood glucose test. You will not be allowed to eat for at least 8 hours before a blood sample is taken.  A random blood glucose test. Your blood glucose is checked at any time of the day regardless of when you ate.  A hemoglobin A1c blood glucose test. A hemoglobin A1c test provides information about blood glucose control over the previous 3 months.  An oral glucose tolerance test (OGTT). Your blood glucose is measured after you have not eaten (fasted) for 2 hours and then after you drink a glucose-containing beverage. TREATMENT   You may need to take insulin or diabetes medicine daily to keep blood glucose levels in the desired range.  You will need to match insulin dosing with exercise and healthy food choices. The treatment goal is to maintain the before meal blood sugar (preprandial glucose) level at 70 130 mg/dL. HOME CARE INSTRUCTIONS   Have your hemoglobin A1c level checked twice a year.  Perform daily blood glucose monitoring as directed by your caregiver.  Monitor urine ketones when you are ill and as directed by your caregiver.  Take your diabetes medicine or insulin as directed by your caregiver to maintain your blood glucose levels in the desired range.  Never run out of diabetes medicine or insulin. It is needed every day.  Adjust insulin based on your intake of carbohydrates. Carbohydrates can raise blood glucose levels but need to be included in your diet. Carbohydrates provide vitamins, minerals, and fiber which are an essential part of   a healthy diet. Carbohydrates are found in fruits, vegetables, whole grains, dairy products, legumes, and foods containing added sugars.    Eat healthy foods. Alternate 3 meals with 3 snacks.  Lose weight if overweight.  Carry a medical alert card or wear your medical alert jewelry.  Carry a 15 gram  carbohydrate snack with you at all times to treat low blood glucose (hypoglycemia). Some examples of 15 gram carbohydrate snacks include:  Glucose tablets, 3 or 4   Glucose gel, 15 gram tube  Raisins, 2 tablespoons (24 grams)  Jelly beans, 6  Animal crackers, 8  Regular pop, 4 ounces (120 mL)  Gummy treats, 9  Recognize hypoglycemia. Hypoglycemia occurs with blood glucose levels of 70 mg/dL and below. The risk for hypoglycemia increases when fasting or skipping meals, during or after intense exercise, and during sleep. Hypoglycemia symptoms can include:  Tremors or shakes.  Decreased ability to concentrate.  Sweating.  Increased heart rate.  Headache.  Dry mouth.  Hunger.  Irritability.  Anxiety.  Restless sleep.  Altered speech or coordination.  Confusion.  Treat hypoglycemia promptly. If you are alert and able to safely swallow, follow the 15:15 rule:  Take 15 20 grams of rapid-acting glucose or carbohydrate. Rapid-acting options include glucose gel, glucose tablets, or 4 ounces (120 mL) of fruit juice, regular soda, or low fat milk.  Check your blood glucose level 15 minutes after taking the glucose.  Take 15 20 grams more of glucose if the repeat blood glucose level is still 70 mg/dL or below.  Eat a meal or snack within 1 hour once blood glucose levels return to normal.    Be alert to polyuria and polydipsia which are early signs of hyperglycemia. An early awareness of hyperglycemia allows for prompt treatment. Treat hyperglycemia as directed by your caregiver.  Engage in at least 150 minutes of moderate-intensity physical activity a week, spread over at least 3 days of the week or as directed by your caregiver. In addition, you should engage in resistance exercise at least 2 times a week or as directed by your caregiver.  Adjust your medicine and food intake as needed if you start a new exercise or sport.  Follow your sick day plan at any time you  are unable to eat or drink as usual.  Avoid tobacco use.  Limit alcohol intake to no more than 1 drink per day for nonpregnant women and 2 drinks per day for men. You should drink alcohol only when you are also eating food. Talk with your caregiver whether alcohol is safe for you. Tell your caregiver if you drink alcohol several times a week.  Follow up with your caregiver regularly.  Schedule an eye exam soon after the diagnosis of type 2 diabetes and then annually.  Perform daily skin and foot care. Examine your skin and feet daily for cuts, bruises, redness, nail problems, bleeding, blisters, or sores. A foot exam by a caregiver should be done annually.  Brush your teeth and gums at least twice a day and floss at least once a day. Follow up with your dentist regularly.  Share your diabetes management plan with your workplace or school.  Stay up-to-date with immunizations.  Learn to manage stress.  Obtain ongoing diabetes education and support as needed.  Participate in, or seek rehabilitation as needed to maintain or improve independence and quality of life. Request a physical or occupational therapy referral if you are having foot or hand numbness or difficulties with grooming,   dressing, eating, or physical activity. SEEK MEDICAL CARE IF:   You are unable to eat food or drink fluids for more than 6 hours.  You have nausea and vomiting for more than 6 hours.  Your blood glucose level is over 240 mg/dL.  There is a change in mental status.  You develop an additional serious illness.  You have diarrhea for more than 6 hours.  You have been sick or have had a fever for a couple of days and are not getting better.  You have pain during any physical activity.  SEEK IMMEDIATE MEDICAL CARE IF:  You have difficulty breathing.  You have moderate to large ketone levels. MAKE SURE YOU:  Understand these instructions.  Will watch your condition.  Will get help right away if  you are not doing well or get worse. Document Released: 06/24/2005 Document Revised: 03/18/2012 Document Reviewed: 01/21/2012 [employer] Patient Information 2014 [employer].  

## 2013-03-19 NOTE — Patient Instructions (Signed)
Discussed orally 

## 2013-03-19 NOTE — Progress Notes (Signed)
Subjective:    Patient ID: Cody Hale, male    DOB: [birth year], 62 y.o.   MRN: [id number]  Diabetes He presents for his follow-up diabetic visit. He has type 2 diabetes mellitus. The initial diagnosis of diabetes was made 14 years ago. His disease course has been stable. There are no hypoglycemic associated symptoms. Pertinent negatives for hypoglycemia include no dizziness or pallor. Associated symptoms include polydipsia. Pertinent negatives for diabetes include no blurred vision, no chest pain, no fatigue, no foot paresthesias, no foot ulcerations, no polyphagia, no polyuria, no visual change, no weakness and no weight loss. There are no hypoglycemic complications. Symptoms are stable. Diabetic complications include autonomic neuropathy and heart disease. Current diabetic treatment includes oral agent (monotherapy). His weight is stable. He is following a generally unhealthy diet. When asked about meal planning, he reported none. He never participates in exercise. There is no change in his home blood glucose trend. An ACE inhibitor/angiotensin II receptor blocker is contraindicated. He does not see a podiatrist.Eye exam is not current.      Review of Systems  Constitutional: Negative.  Negative for fever, chills, weight loss, diaphoresis, activity change, appetite change, fatigue and unexpected weight change.  HENT: Negative.   Eyes: Negative.  Negative for blurred vision.  Respiratory: Positive for apnea. Negative for cough, choking, chest tightness, shortness of breath, wheezing and stridor.   Cardiovascular: Positive for leg swelling. Negative for chest pain and palpitations.  Gastrointestinal: Negative.  Negative for nausea, vomiting, abdominal pain, diarrhea, constipation and blood in stool.  Endocrine: Positive for polydipsia. Negative for polyphagia and polyuria.  Genitourinary: Negative.   Musculoskeletal: Positive for arthralgias (hip pain for 3 years, he tells me that xrays show  DJD). Negative for myalgias, back pain, joint swelling and gait problem.  Skin: Positive for rash (he has an itchy scaly rash in his groin and over his buttocks). Negative for color change, pallor and wound.  Allergic/Immunologic: Negative.   Neurological: Negative.  Negative for dizziness, weakness and light-headedness.  Hematological: Negative.  Negative for adenopathy. Does not bruise/bleed easily.  Psychiatric/Behavioral: Negative.        Objective:   Physical Exam  Vitals reviewed. Constitutional: He is oriented to person, place, and time. He appears well-developed and well-nourished. No distress.  HENT:  Head: Normocephalic and atraumatic.  Mouth/Throat: Oropharynx is clear and moist. No oropharyngeal exudate.  Eyes: Conjunctivae are normal. Right eye exhibits no discharge. Left eye exhibits no discharge. No scleral icterus.  Neck: Normal range of motion. Neck supple. No JVD present. No tracheal deviation present. No thyromegaly present.  Cardiovascular: Normal rate, regular rhythm, normal heart sounds and intact distal pulses.  Exam reveals no gallop and no friction rub.   No murmur heard. Pulmonary/Chest: Effort normal and breath sounds normal. No stridor. No respiratory distress. He has no wheezes. He has no rales. He exhibits no tenderness.  Abdominal: Soft. Bowel sounds are normal. He exhibits no distension and no mass. There is no tenderness. There is no rebound and no guarding. Hernia confirmed negative in the right inguinal area and confirmed negative in the left inguinal area.  Genitourinary: Rectum normal, prostate normal, testes normal and penis normal. Rectal exam shows no external hemorrhoid, no internal hemorrhoid, no fissure, no mass, no tenderness and anal tone normal. Guaiac negative stool. Prostate is not enlarged and not tender. Right testis shows no mass, no swelling and no tenderness. Right testis is descended. Left testis shows no mass, no swelling and no tenderness.  Left testis is descended. Circumcised. No penile erythema or penile tenderness. No discharge found.  Musculoskeletal: Normal range of motion. He exhibits edema (trace pitting edema in BLE). He exhibits no tenderness.  Lymphadenopathy:    He has no cervical adenopathy.       Right: No inguinal adenopathy present.       Left: No inguinal adenopathy present.  Neurological: He is oriented to person, place, and time.  Skin: Skin is warm and dry. Rash noted. No abrasion, no bruising, no burn, no ecchymosis, no laceration, no lesion, no petechiae and no purpura noted. Rash is not macular, not papular, not maculopapular, not nodular, not pustular, not vesicular and not urticarial. He is not diaphoretic. There is erythema. No cyanosis. No pallor. Nails show no clubbing.     Psychiatric: He has a normal mood and affect. His behavior is normal. Judgment and thought content normal.     Lab Results  Component Value Date   WBC 11.2* 03/10/2013   HGB 12.6* 03/10/2013   HCT 41.4 03/10/2013   PLT 189 03/10/2013   GLUCOSE 142* 03/12/2013   CHOL 129 07/02/2012   TRIG 114 07/02/2012   HDL 31* 07/02/2012   LDLCALC 75 07/02/2012   ALT 13 03/10/2013   AST 23 03/10/2013   NA 141 03/12/2013   K 3.8 03/12/2013   CL 96 03/12/2013   CREATININE 0.76 03/12/2013   BUN 22 03/12/2013   CO2 39* 03/12/2013   TSH 2.259 11/30/2012   INR 1.13 11/30/2012   HGBA1C 7.2* 08/26/2012       Assessment & Plan:

## 2013-03-19 NOTE — Progress Notes (Signed)
Patient:  Cody Hale   DOB: 06-Dec-1950  MR Number: [id number]  Location: [facility]:  [address] [place],  [place], [address]  Start: Friday 03/19/2013 11:05 AM End: Friday 03/19/2013 11:50 AM  Provider/Observer:     Florencia Reasons, MSW, LCSW   Chief Complaint:      Chief Complaint  Patient presents with  . Anxiety  . Depression    Reason For Service:    The patient is referred for services by psychiatrist Dr. Lolly Mustache due to patient experiencing symptoms of depression. Patient reports experiencing emotional problems related to medical issues and lifestyle changes from having a heart valve replacement in March 2014. Prior to then, patient had been hospitalized 3 times due to medical issues. He reports becoming depressed and angry and mistreating his wife often arguing and yelling. Patient was hospitalized at the [facility] in June 2014 due to depression and passive suicidal thoughts. He and his wife are separated but are in the process of trying to work out their marriage per patient's report. Patient is seen for a followup appointment today.   Interventions Strategy:  Supportive therapy  Participation Level:   Active  Participation Quality:  Appropriate      Behavioral Observation:  Casual, Alert, and Appropriate.   Current Psychosocial Factors: Patient and his wife have reconciled and resumed living together. Patient has upcoming court date on 03/24/2013 due to violating restraining order.  Content of Session:   Reviewing symptoms, establishing rapport, processing feelings, reinforcing patient's efforts to improve self-care and verbalize feelings, exploring relaxation techniques  Current Status:   Patient reports much less depressed and improved sleep pattern but continued nervousness.  Patient Progress:   Patient reports feeling much better since taking medication. However he reports still feeling nervous and jittery at times as well as worrying  about the future. He reports recently being discharged from the hospital after being seen in the emergency department due to congestive heart failure. Per patient's report, this was an eye-opening experience that has helped him understand his wife and now accept her suggestions to try to help him improve his care. He now is committed to better self-care including healthy eating and exercise. Patient also reports now trying to listen more to her his wife rather than interrupting and arguing as he has in the past. He reports much improvement in their marriage. However, he is concerned about upcoming court date regarding his violation of the restraining order. He has a court Radio producer and is glad his wife plans to attend court.  Therapist works with patient to process his feelings and to explore relaxation techniques.  Target Goals:   Establish rapport, decrease  anxiety  Last Reviewed:     Goals Addressed Today:    Establish rapport, decrease anxiety  Impression/Diagnosis:    The patient presents with a history of symptoms of depression that began last year when patient was experiencing health problems including diabetes that lead to early retirement in December 2013. He continued to have medical issues that necessitated a heart valve replacement in March 2014. Lifestyle changes and adjustments exacerbated symptoms and increased marital stress resulting in psychiatric hospitalization in June 2014. Symptoms at that time included depression, anger, and suicidal thoughts. Since that time, patient has adjusted well to medication and symptoms have subsided per patient's report. Diagnosis: Major depressive disorder      Diagnosis:  Axis I: Major depressive disorder, recurrent episode, moderate  Axis II: Deferred

## 2013-03-20 LAB — HEPATITIS C ANTIBODY: HCV Ab: NEGATIVE

## 2013-03-21 ENCOUNTER — Encounter: Payer: Self-pay | Admitting: Internal Medicine

## 2013-03-21 DIAGNOSIS — Z7189 Other specified counseling: Secondary | ICD-10-CM | POA: Insufficient documentation

## 2013-03-21 DIAGNOSIS — Z0000 Encounter for general adult medical examination without abnormal findings: Secondary | ICD-10-CM | POA: Insufficient documentation

## 2013-03-21 NOTE — Assessment & Plan Note (Signed)
I have asked him to start oral B12 replacement therapy

## 2013-03-21 NOTE — Assessment & Plan Note (Signed)
He will work on his lifestyle modifications to lose weight 

## 2013-03-21 NOTE — Assessment & Plan Note (Signed)
He will try tramadol for pain

## 2013-03-21 NOTE — Assessment & Plan Note (Signed)
His BP is well controlled Lytes and renal function are okay

## 2013-03-21 NOTE — Assessment & Plan Note (Signed)
He will start Crestor for risk reduction 

## 2013-03-21 NOTE — Assessment & Plan Note (Signed)
His A1C shows good control He was referred for an eye exam

## 2013-03-21 NOTE — Assessment & Plan Note (Signed)
Exam done Labs ordered and reviewed Vaccines were updated

## 2013-03-21 NOTE — Assessment & Plan Note (Signed)
Will treat with ketoconazole cream 

## 2013-03-22 ENCOUNTER — Encounter (HOSPITAL_COMMUNITY)
Admission: RE | Admit: 2013-03-22 | Discharge: 2013-03-22 | Disposition: A | Discharge status: Home / Self Care | Payer: BC Managed Care – PPO | Source: Ambulatory Visit | Attending: Cardiology | Admitting: Cardiology

## 2013-03-22 ENCOUNTER — Ambulatory Visit: Payer: Self-pay | Admitting: Family Medicine

## 2013-03-22 ENCOUNTER — Ambulatory Visit: Payer: BC Managed Care – PPO | Admitting: Family Medicine

## 2013-03-24 ENCOUNTER — Encounter (HOSPITAL_COMMUNITY): Payer: BC Managed Care – PPO

## 2013-03-25 ENCOUNTER — Ambulatory Visit: Payer: BC Managed Care – PPO | Attending: Family Medicine | Admitting: Sleep Medicine

## 2013-03-25 DIAGNOSIS — G4733 Obstructive sleep apnea (adult) (pediatric): Secondary | ICD-10-CM | POA: Insufficient documentation

## 2013-03-25 DIAGNOSIS — G4700 Insomnia, unspecified: Secondary | ICD-10-CM

## 2013-03-25 DIAGNOSIS — G4761 Periodic limb movement disorder: Secondary | ICD-10-CM | POA: Insufficient documentation

## 2013-03-26 ENCOUNTER — Encounter (HOSPITAL_COMMUNITY): Payer: BC Managed Care – PPO

## 2013-03-26 ENCOUNTER — Encounter: Payer: Self-pay | Admitting: Adult Health
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
## 2013-03-27 NOTE — Procedures (Signed)
[facility] Tiawana Forgy A. Gerilyn Pilgrim, MD     [email or web address]        NAME:  Cody Hale, Cody Hale                ACCOUNT NO.:  [id number]  MEDICAL RECORD NO.:  [id number]          PATIENT TYPE:  OUT  LOCATION:  SLEEP LAB                     FACILITY:  [facility]  PHYSICIAN:  Zymeir Salminen A. Gerilyn Pilgrim, M.D. DATE OF BIRTH:  1951/03/15  DATE OF STUDY:  03/25/2013                           NOCTURNAL POLYSOMNOGRAM   REFERRING PHYSICIAN:  Brendia Sacks, MD  INDICATION FOR STUDY:  A 62 year old who presents with hypersomnia, fatigue, and dyspnea while sleeping.  EPWORTH SLEEPINESS SCORE:  5.  BMI:  37.  MEDICATIONS:  Aspirin, albuterol, Coreg, citalopram, clonazepam, Lasix, insulin, metoclopramide, trazodone.  SLEEP ARCHITECTURE:  This is a split night recording with initial portion being a diagnostic and the second portion a titration recording. The total recording time is 360 minutes.  Sleep efficiency is 85%. Sleep latency 4 minutes.  REM latency 192 minutes.  RESPIRATORY DATA:  Baseline oxygen saturation is 98, lowest saturation 59 during REM sleep.  Diagnostic AHI is 69.  The patient was placed on positive pressure at 5 and increased to 10.  The patient developed centrals with desaturations and was subsequently placed on bilevel pressures starting at 10/5 to 12/7.  The patient did marginally well on bilevel pressures.  He also had to be placed on 2 L of oxygen.  Even on positive pressure, he still had desaturations.  The best pressure was 12/7 and even with this he decided down to 74.  He did better with oxygen, however with this is added, desating down to 87% with 2 L.  LIMB MOVEMENT SUMMARY:  PLM index is 53.  ELECTROCARDIOGRAM SUMMARY:  Average heart rate is 70.  The patient was noted to have pressures with atrial contractions and also PVCs throughout the recording.  ASSESSMENT: 1. Severe obstructive sleep apnea syndrome which responded marginally     to bilevel pressures of 12/7  with 2 L.  This pressure is     recommended, but the patient will likely do better with Servo     Ventilation. Consider this servo ventilation titration. 2. Severe periodic limb movement disorder sleep.  Thanks for this referral.     Ruhaan Nordahl A. Gerilyn Pilgrim, M.D.    KAD/MEDQ  D:  03/27/2013 20:15:54  T:  03/27/2013 20:41:49  Job:  [id number]

## 2013-03-29 ENCOUNTER — Encounter (HOSPITAL_COMMUNITY)
Admission: RE | Admit: 2013-03-29 | Discharge: 2013-03-29 | Disposition: A | Discharge status: Home / Self Care | Payer: BC Managed Care – PPO | Source: Ambulatory Visit | Attending: Cardiology | Admitting: Cardiology

## 2013-03-30 ENCOUNTER — Ambulatory Visit (HOSPITAL_COMMUNITY): Payer: Self-pay | Admitting: Psychiatry

## 2013-03-31 ENCOUNTER — Encounter (HOSPITAL_COMMUNITY)
Admission: RE | Admit: 2013-03-31 | Discharge: 2013-03-31 | Disposition: A | Discharge status: Home / Self Care | Payer: BC Managed Care – PPO | Source: Ambulatory Visit | Attending: Cardiology | Admitting: Cardiology

## 2013-04-01 ENCOUNTER — Ambulatory Visit (HOSPITAL_COMMUNITY): Payer: Self-pay | Admitting: Psychiatry

## 2013-04-02 ENCOUNTER — Ambulatory Visit (INDEPENDENT_AMBULATORY_CARE_PROVIDER_SITE_OTHER): Payer: BC Managed Care – PPO | Admitting: Psychiatry

## 2013-04-02 ENCOUNTER — Encounter (HOSPITAL_COMMUNITY): Payer: Self-pay | Admitting: Psychiatry

## 2013-04-02 ENCOUNTER — Encounter (HOSPITAL_COMMUNITY)
Admission: RE | Admit: 2013-04-02 | Discharge: 2013-04-02 | Disposition: A | Discharge status: Home / Self Care | Payer: BC Managed Care – PPO | Source: Ambulatory Visit | Attending: Cardiology | Admitting: Cardiology

## 2013-04-02 VITALS — BP 130/60 | Ht 69.0 in | Wt 251.0 lb

## 2013-04-02 DIAGNOSIS — F331 Major depressive disorder, recurrent, moderate: Secondary | ICD-10-CM

## 2013-04-02 DIAGNOSIS — F411 Generalized anxiety disorder: Secondary | ICD-10-CM

## 2013-04-02 DIAGNOSIS — F1011 Alcohol abuse, in remission: Secondary | ICD-10-CM

## 2013-04-02 DIAGNOSIS — F339 Major depressive disorder, recurrent, unspecified: Secondary | ICD-10-CM

## 2013-04-02 MED ORDER — CLONAZEPAM 1 MG PO TABS: 1.0000 mg | ORAL_TABLET | Freq: Three times a day (TID) | ORAL | Status: DC
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
## 2013-04-05 ENCOUNTER — Encounter (HOSPITAL_COMMUNITY)
Admission: RE | Admit: 2013-04-05 | Discharge: 2013-04-05 | Disposition: A | Discharge status: Home / Self Care | Payer: BC Managed Care – PPO | Source: Ambulatory Visit | Attending: Cardiology | Admitting: Cardiology

## 2013-04-06 ENCOUNTER — Ambulatory Visit (INDEPENDENT_AMBULATORY_CARE_PROVIDER_SITE_OTHER): Payer: BC Managed Care – PPO | Admitting: Psychiatry

## 2013-04-06 DIAGNOSIS — F331 Major depressive disorder, recurrent, moderate: Secondary | ICD-10-CM

## 2013-04-06 NOTE — Patient Instructions (Signed)
Discussed orally 

## 2013-04-06 NOTE — Progress Notes (Signed)
Patient:  Cody Hale   DOB: 1950-11-26  MR Number: [id number]  Location: [facility] Center:  [address] [place],  [place], [address]  Start: Tuesday 04/06/3013 11:10 AM End: Tuesday 04/06/2013 11:55 AM  Provider/Observer:     Florencia Reasons, MSW, LCSW   Chief Complaint:      Chief Complaint  Patient presents with  . Depression    Reason For Service:    The patient is referred for services by psychiatrist Dr. Lolly Mustache due to patient experiencing symptoms of depression. Patient reports experiencing emotional problems related to medical issues and lifestyle changes from having a heart valve replacement in March 62 2014. Prior to then, patient had been hospitalized 3 times due to medical issues. He reports becoming depressed and angry and mistreating his wife often arguing and yelling. Patient was hospitalized at the [facility] in June 2014 due to depression and passive suicidal thoughts. He and his wife are separated but are in the process of trying to work out their marriage per patient's report. Patient is seen for a followup appointment today.   Interventions Strategy:  Supportive therapy  Participation Level:   Active  Participation Quality:  Appropriate      Behavioral Observation:  Casual, Alert, and Appropriate.   Current Psychosocial Factors:  The anniversary of patient's son's death is next month. He died at 62 years old about 30 years ago due to brain tumor. Patient's wife is experiencing health issues and sciatica  Content of Session:   Reviewing symptoms, developing treatment plan, processing feelings, identifying ways to improve daily structure and activity  Current Status:   Patient reports less depressed mood and improved sleep pattern. He is experiencing some anxiety regarding finances.  Patient Progress:   Patient reports continuing to feel better but starting to lose motivation regarding being active when he isn't attending  the cardiac  rehabilitation facility. He states sometimes just being a couch potato. He also reports irritability and frustration with self due to to his health issues. Patient is experiencing adjustment and loss issues related to a sudden retirement and changed physical functioning. He reports improvement in the relationship with his wife but also reports adjustment issues related to being with each other all the time. Therapist works with patient to develop a treatment plan and to identify ways to improve daily structure and activity. Patient agrees to use plan your day handouts and bring to next session.  Target Goals:   1. Improve self-care regarding nutrition and exercise within capability: 1:! psychotherapy one time every one to 4 weeks (supportive, CBT)    2. Increase self acceptance: 1:1 psychotherapy one time every one to 4 weeks (supportive, CBT)    3. Improve communication skills-avoiding interrupting conversations, avoid yelling: 1:1 psychotherapy one time every one to 4 weeks (supportive, CBT)    4. Process and resolve feelings of loss related to changed physical functioning and sudden retirement: 1:1 psychotherapy one time every one to 4 weeks (supportive, CBT)  Last Reviewed:   04/06/2013  Goals Addressed Today:    1, 4  Impression/Diagnosis:    The patient presents with a history of symptoms of depression that began last year when patient was experiencing health problems including diabetes that lead to early retirement in December 2013. He continued to have medical issues that necessitated a heart valve replacement in March 62 2014. Lifestyle changes and adjustments exacerbated symptoms and increased marital stress resulting in psychiatric hospitalization in June 2014. Symptoms at that time included depression,  anger, and suicidal thoughts. Since that time, patient has adjusted well to medication and symptoms have subsided per patient's report. Diagnosis: Major depressive  disorder      Diagnosis:  Axis I: Major depressive disorder, recurrent episode, moderate          Axis II: Deferred

## 2013-04-07 ENCOUNTER — Encounter (HOSPITAL_COMMUNITY)
Admission: RE | Admit: 2013-04-07 | Discharge: 2013-04-07 | Disposition: A | Discharge status: Home / Self Care | Payer: BC Managed Care – PPO | Source: Ambulatory Visit | Attending: Cardiology | Admitting: Cardiology

## 2013-04-07 DIAGNOSIS — Z5189 Encounter for other specified aftercare: Secondary | ICD-10-CM | POA: Insufficient documentation

## 2013-04-07 DIAGNOSIS — I059 Rheumatic mitral valve disease, unspecified: Secondary | ICD-10-CM | POA: Insufficient documentation

## 2013-04-07 DIAGNOSIS — I5032 Chronic diastolic (congestive) heart failure: Secondary | ICD-10-CM | POA: Insufficient documentation

## 2013-04-09 ENCOUNTER — Encounter (HOSPITAL_COMMUNITY)
Admission: RE | Admit: 2013-04-09 | Discharge: 2013-04-09 | Disposition: A | Discharge status: Home / Self Care | Payer: BC Managed Care – PPO | Source: Ambulatory Visit | Attending: Cardiology | Admitting: Cardiology

## 2013-04-12 ENCOUNTER — Encounter (HOSPITAL_COMMUNITY)
Admission: RE | Admit: 2013-04-12 | Discharge: 2013-04-12 | Disposition: A | Discharge status: Home / Self Care | Payer: BC Managed Care – PPO | Source: Ambulatory Visit | Attending: Cardiology | Admitting: Cardiology

## 2013-04-14 ENCOUNTER — Encounter (HOSPITAL_COMMUNITY)
Admission: RE | Admit: 2013-04-14 | Discharge: 2013-04-14 | Disposition: A | Discharge status: Home / Self Care | Payer: BC Managed Care – PPO | Source: Ambulatory Visit | Attending: Cardiology | Admitting: Cardiology

## 2013-04-16 ENCOUNTER — Encounter (HOSPITAL_COMMUNITY)
Admission: RE | Admit: 2013-04-16 | Discharge: 2013-04-16 | Disposition: A | Discharge status: Home / Self Care | Payer: BC Managed Care – PPO | Source: Ambulatory Visit | Attending: Cardiology | Admitting: Cardiology

## 2013-04-20 ENCOUNTER — Ambulatory Visit (HOSPITAL_COMMUNITY): Payer: Self-pay | Admitting: Psychiatry

## 2013-04-21 ENCOUNTER — Ambulatory Visit: Payer: Self-pay | Admitting: Internal Medicine

## 2013-04-23 ENCOUNTER — Encounter (HOSPITAL_COMMUNITY)
Admission: RE | Admit: 2013-04-23 | Discharge: 2013-04-23 | Disposition: A | Discharge status: Home / Self Care | Payer: BC Managed Care – PPO | Source: Ambulatory Visit | Attending: Cardiology | Admitting: Cardiology

## 2013-04-23 ENCOUNTER — Other Ambulatory Visit: Payer: Self-pay

## 2013-04-23 MED ORDER — METOCLOPRAMIDE HCL 10 MG PO TABS: 5.0000 mg | ORAL_TABLET | Freq: Three times a day (TID) | ORAL | Status: DC

## 2013-04-26 ENCOUNTER — Encounter (HOSPITAL_COMMUNITY): Payer: BC Managed Care – PPO

## 2013-04-28 ENCOUNTER — Encounter (HOSPITAL_COMMUNITY)
Admission: RE | Admit: 2013-04-28 | Discharge: 2013-04-28 | Disposition: A | Discharge status: Home / Self Care | Payer: BC Managed Care – PPO | Source: Ambulatory Visit | Attending: Cardiology | Admitting: Cardiology

## 2013-04-30 ENCOUNTER — Encounter (HOSPITAL_COMMUNITY)
Admission: RE | Admit: 2013-04-30 | Discharge: 2013-04-30 | Disposition: A | Discharge status: Home / Self Care | Payer: BC Managed Care – PPO | Source: Ambulatory Visit | Attending: Cardiology | Admitting: Cardiology

## 2013-04-30 ENCOUNTER — Ambulatory Visit (HOSPITAL_COMMUNITY): Payer: Self-pay | Admitting: Psychiatry

## 2013-05-03 ENCOUNTER — Encounter (HOSPITAL_COMMUNITY)
Admission: RE | Admit: 2013-05-03 | Discharge: 2013-05-03 | Disposition: A | Discharge status: Home / Self Care | Payer: BC Managed Care – PPO | Source: Ambulatory Visit | Attending: Cardiology | Admitting: Cardiology

## 2013-05-05 ENCOUNTER — Encounter (HOSPITAL_COMMUNITY)
Admission: RE | Admit: 2013-05-05 | Discharge: 2013-05-05 | Disposition: A | Discharge status: Home / Self Care | Payer: BC Managed Care – PPO | Source: Ambulatory Visit | Attending: Cardiology | Admitting: Cardiology

## 2013-05-07 ENCOUNTER — Encounter (HOSPITAL_COMMUNITY): Payer: BC Managed Care – PPO

## 2013-05-10 ENCOUNTER — Encounter (HOSPITAL_COMMUNITY)
Admission: RE | Admit: 2013-05-10 | Discharge: 2013-05-10 | Disposition: A | Discharge status: Home / Self Care | Payer: BC Managed Care – PPO | Source: Ambulatory Visit | Attending: Cardiology | Admitting: Cardiology

## 2013-05-10 DIAGNOSIS — I5032 Chronic diastolic (congestive) heart failure: Secondary | ICD-10-CM | POA: Insufficient documentation

## 2013-05-10 DIAGNOSIS — I059 Rheumatic mitral valve disease, unspecified: Secondary | ICD-10-CM | POA: Insufficient documentation

## 2013-05-10 DIAGNOSIS — Z5189 Encounter for other specified aftercare: Secondary | ICD-10-CM | POA: Insufficient documentation

## 2013-05-12 ENCOUNTER — Encounter (HOSPITAL_COMMUNITY)
Admission: RE | Admit: 2013-05-12 | Discharge: 2013-05-12 | Disposition: A | Discharge status: Home / Self Care | Payer: BC Managed Care – PPO | Source: Ambulatory Visit | Attending: Cardiology | Admitting: Cardiology

## 2013-05-14 ENCOUNTER — Encounter (HOSPITAL_COMMUNITY)
Admission: RE | Admit: 2013-05-14 | Discharge: 2013-05-14 | Disposition: A | Discharge status: Home / Self Care | Payer: BC Managed Care – PPO | Source: Ambulatory Visit | Attending: Cardiology | Admitting: Cardiology

## 2013-05-14 NOTE — Progress Notes (Signed)
Patient graduated from Cardiac Rehabilitation today on 05/14/13 after completing 36 sessions. He achieved LTG of 30 minutes of aerobic exercise at Max Met level of 3.90. All patients vitals are WNL. Patient has met with dietician. Discharge instruction has been reviewed in detail and patient stated an understanding of material given. Patient plans to exercise at home on his Treadmill. Cardiac Rehab staff will make f/u calls at 1 month, 6 months, and 1 year. Patient had no complaints of any abnormal S/S or pain on their exit visit. Patient able to complete 6 minute walk test.

## 2013-05-20 ENCOUNTER — Other Ambulatory Visit: Payer: Self-pay | Admitting: *Deleted

## 2013-06-03 ENCOUNTER — Encounter (HOSPITAL_COMMUNITY): Payer: Self-pay | Admitting: Emergency Medicine

## 2013-06-03 ENCOUNTER — Inpatient Hospital Stay (HOSPITAL_COMMUNITY)
Admission: EM | Admit: 2013-06-03 | Discharge: 2013-06-08 | DRG: 291 | Disposition: A | Discharge status: Home Health | Payer: BC Managed Care – PPO | Attending: Family Medicine | Admitting: Family Medicine

## 2013-06-03 ENCOUNTER — Emergency Department (HOSPITAL_COMMUNITY): Payer: BC Managed Care – PPO

## 2013-06-03 DIAGNOSIS — D696 Thrombocytopenia, unspecified: Secondary | ICD-10-CM | POA: Diagnosis present

## 2013-06-03 DIAGNOSIS — Z801 Family history of malignant neoplasm of trachea, bronchus and lung: Secondary | ICD-10-CM

## 2013-06-03 DIAGNOSIS — Z6841 Body Mass Index (BMI) 40.0 and over, adult: Secondary | ICD-10-CM

## 2013-06-03 DIAGNOSIS — I5023 Acute on chronic systolic (congestive) heart failure: Principal | ICD-10-CM | POA: Diagnosis present

## 2013-06-03 DIAGNOSIS — I5021 Acute systolic (congestive) heart failure: Secondary | ICD-10-CM | POA: Diagnosis present

## 2013-06-03 DIAGNOSIS — R109 Unspecified abdominal pain: Secondary | ICD-10-CM

## 2013-06-03 DIAGNOSIS — I509 Heart failure, unspecified: Secondary | ICD-10-CM | POA: Diagnosis present

## 2013-06-03 DIAGNOSIS — K5900 Constipation, unspecified: Secondary | ICD-10-CM | POA: Diagnosis present

## 2013-06-03 DIAGNOSIS — E119 Type 2 diabetes mellitus without complications: Secondary | ICD-10-CM | POA: Diagnosis present

## 2013-06-03 DIAGNOSIS — Z833 Family history of diabetes mellitus: Secondary | ICD-10-CM

## 2013-06-03 DIAGNOSIS — R0600 Dyspnea, unspecified: Secondary | ICD-10-CM

## 2013-06-03 DIAGNOSIS — Z87891 Personal history of nicotine dependence: Secondary | ICD-10-CM

## 2013-06-03 DIAGNOSIS — I10 Essential (primary) hypertension: Secondary | ICD-10-CM | POA: Diagnosis present

## 2013-06-03 DIAGNOSIS — E118 Type 2 diabetes mellitus with unspecified complications: Secondary | ICD-10-CM | POA: Diagnosis present

## 2013-06-03 DIAGNOSIS — E1129 Type 2 diabetes mellitus with other diabetic kidney complication: Secondary | ICD-10-CM

## 2013-06-03 DIAGNOSIS — Z8249 Family history of ischemic heart disease and other diseases of the circulatory system: Secondary | ICD-10-CM

## 2013-06-03 DIAGNOSIS — Z952 Presence of prosthetic heart valve: Secondary | ICD-10-CM

## 2013-06-03 DIAGNOSIS — F332 Major depressive disorder, recurrent severe without psychotic features: Secondary | ICD-10-CM | POA: Diagnosis present

## 2013-06-03 DIAGNOSIS — Z8041 Family history of malignant neoplasm of ovary: Secondary | ICD-10-CM

## 2013-06-03 DIAGNOSIS — I2510 Atherosclerotic heart disease of native coronary artery without angina pectoris: Secondary | ICD-10-CM | POA: Diagnosis present

## 2013-06-03 DIAGNOSIS — Z9119 Patient's noncompliance with other medical treatment and regimen: Secondary | ICD-10-CM

## 2013-06-03 DIAGNOSIS — E1165 Type 2 diabetes mellitus with hyperglycemia: Secondary | ICD-10-CM | POA: Diagnosis present

## 2013-06-03 DIAGNOSIS — I447 Left bundle-branch block, unspecified: Secondary | ICD-10-CM | POA: Diagnosis present

## 2013-06-03 DIAGNOSIS — Z954 Presence of other heart-valve replacement: Secondary | ICD-10-CM

## 2013-06-03 DIAGNOSIS — M1990 Unspecified osteoarthritis, unspecified site: Secondary | ICD-10-CM | POA: Diagnosis present

## 2013-06-03 DIAGNOSIS — G4733 Obstructive sleep apnea (adult) (pediatric): Secondary | ICD-10-CM | POA: Diagnosis present

## 2013-06-03 DIAGNOSIS — R112 Nausea with vomiting, unspecified: Secondary | ICD-10-CM

## 2013-06-03 DIAGNOSIS — F411 Generalized anxiety disorder: Secondary | ICD-10-CM | POA: Diagnosis present

## 2013-06-03 DIAGNOSIS — Z87442 Personal history of urinary calculi: Secondary | ICD-10-CM

## 2013-06-03 DIAGNOSIS — E1149 Type 2 diabetes mellitus with other diabetic neurological complication: Secondary | ICD-10-CM | POA: Diagnosis present

## 2013-06-03 DIAGNOSIS — K3184 Gastroparesis: Secondary | ICD-10-CM | POA: Diagnosis present

## 2013-06-03 DIAGNOSIS — Z91199 Patient's noncompliance with other medical treatment and regimen due to unspecified reason: Secondary | ICD-10-CM

## 2013-06-03 DIAGNOSIS — E1143 Type 2 diabetes mellitus with diabetic autonomic (poly)neuropathy: Secondary | ICD-10-CM

## 2013-06-03 DIAGNOSIS — J9600 Acute respiratory failure, unspecified whether with hypoxia or hypercapnia: Secondary | ICD-10-CM | POA: Diagnosis present

## 2013-06-03 DIAGNOSIS — Z7982 Long term (current) use of aspirin: Secondary | ICD-10-CM

## 2013-06-03 DIAGNOSIS — E66812 Obesity, class 2: Secondary | ICD-10-CM | POA: Diagnosis present

## 2013-06-03 DIAGNOSIS — I872 Venous insufficiency (chronic) (peripheral): Secondary | ICD-10-CM | POA: Diagnosis present

## 2013-06-03 DIAGNOSIS — J9601 Acute respiratory failure with hypoxia: Secondary | ICD-10-CM

## 2013-06-03 DIAGNOSIS — N058 Unspecified nephritic syndrome with other morphologic changes: Secondary | ICD-10-CM | POA: Diagnosis present

## 2013-06-03 DIAGNOSIS — Z79899 Other long term (current) drug therapy: Secondary | ICD-10-CM

## 2013-06-03 DIAGNOSIS — Z87440 Personal history of urinary (tract) infections: Secondary | ICD-10-CM

## 2013-06-03 LAB — COMPREHENSIVE METABOLIC PANEL
ALT: 17 U/L (ref 0–53)
AST: 18 U/L (ref 0–37)
Albumin: 4.1 g/dL (ref 3.5–5.2)
Alkaline Phosphatase: 96 U/L (ref 39–117)
BUN: 10 mg/dL (ref 6–23)
CO2: 29 mEq/L (ref 19–32)
Calcium: 9.6 mg/dL (ref 8.4–10.5)
Chloride: 99 mEq/L (ref 96–112)
Creatinine, Ser: 0.78 mg/dL (ref 0.50–1.35)
GFR calc Af Amer: >90 mL/min (ref >90)
GFR calc non Af Amer: >90 mL/min (ref >90)
Glucose, Bld: 210 mg/dL — ABNORMAL HIGH (ref 70–99)
Potassium: 3.7 mEq/L (ref 3.5–5.1)
Sodium: 136 mEq/L (ref 135–145)
Total Bilirubin: 0.6 mg/dL (ref 0.3–1.2)
Total Protein: 8 g/dL (ref 6.0–8.3)

## 2013-06-03 LAB — CBC WITH DIFFERENTIAL/PLATELET
Basophils Absolute: 0 10*3/uL (ref 0.0–0.1)
Basophils Relative: 0 % (ref 0–1)
Eosinophils Absolute: 0.1 10*3/uL (ref 0.0–0.7)
Eosinophils Relative: 1 % (ref 0–5)
HCT: 45.6 % (ref 39.0–52.0)
Hemoglobin: 14.8 g/dL (ref 13.0–17.0)
Lymphocytes Relative: 7 % — ABNORMAL LOW (ref 12–46)
Lymphs Abs: 0.4 10*3/uL — ABNORMAL LOW (ref 0.7–4.0)
MCH: 29.8 pg (ref 26.0–34.0)
MCHC: 32.5 g/dL (ref 30.0–36.0)
MCV: 91.8 fL (ref 78.0–100.0)
Monocytes Absolute: 0.2 10*3/uL (ref 0.1–1.0)
Monocytes Relative: 4 % (ref 3–12)
Neutro Abs: 4.7 10*3/uL (ref 1.7–7.7)
Neutrophils Relative %: 88 % — ABNORMAL HIGH (ref 43–77)
Platelets: 99 10*3/uL — ABNORMAL LOW (ref 150–400)
RBC: 4.97 MIL/uL (ref 4.22–5.81)
RDW: 14.1 % (ref 11.5–15.5)
Smear Review: DECREASED
WBC: 5.4 10*3/uL (ref 4.0–10.5)

## 2013-06-03 LAB — LIPASE, BLOOD: Lipase: 15 U/L (ref 11–59)

## 2013-06-03 LAB — PRO B NATRIURETIC PEPTIDE: Pro B Natriuretic peptide (BNP): 1294 pg/mL — ABNORMAL HIGH (ref 0–125)

## 2013-06-03 MED ORDER — SODIUM CHLORIDE 0.9 % IV SOLN
INTRAVENOUS | Status: DC
  Administered 2013-06-03: 17:00:00 via INTRAVENOUS

## 2013-06-03 MED ORDER — ONDANSETRON HCL 4 MG PO TABS: 4.0000 mg | ORAL_TABLET | Freq: Four times a day (QID) | ORAL | Status: DC

## 2013-06-03 MED ORDER — NITROGLYCERIN 2 % TD OINT
1.0000 [in_us] | TOPICAL_OINTMENT | Freq: Once | TRANSDERMAL | Status: AC
  Administered 2013-06-03: 1 [in_us] via TOPICAL
  Filled 2013-06-03: qty 1

## 2013-06-03 MED ORDER — DIPHENHYDRAMINE HCL 50 MG/ML IJ SOLN
25.0000 mg | Freq: Once | INTRAMUSCULAR | Status: AC
  Administered 2013-06-03: 25 mg via INTRAVENOUS
  Filled 2013-06-03: qty 1

## 2013-06-03 MED ORDER — METOCLOPRAMIDE HCL 5 MG/ML IJ SOLN
10.0000 mg | Freq: Once | INTRAMUSCULAR | Status: AC
  Administered 2013-06-03: 10 mg via INTRAVENOUS
  Filled 2013-06-03: qty 2

## 2013-06-03 MED ORDER — FAMOTIDINE IN NACL 20-0.9 MG/50ML-% IV SOLN
20.0000 mg | Freq: Once | INTRAVENOUS | Status: AC
  Administered 2013-06-03: 20 mg via INTRAVENOUS
  Filled 2013-06-03: qty 50

## 2013-06-03 MED ORDER — FUROSEMIDE 10 MG/ML IJ SOLN
80.0000 mg | Freq: Once | INTRAMUSCULAR | Status: AC
  Administered 2013-06-03: 80 mg via INTRAVENOUS
  Filled 2013-06-03: qty 8

## 2013-06-03 NOTE — ED Notes (Signed)
Pt placed back o2. 2L NT, Jennette Banker is to place pt back on the monitor.
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
## 2013-06-03 NOTE — ED Notes (Signed)
Hospitalist advised that Dr. Lynelle Doctor was to see pt until pt is brought up stairs.

## 2013-06-03 NOTE — ED Notes (Signed)
Onset today of abdominal pain and vomiting after eating large lunch

## 2013-06-03 NOTE — ED Notes (Signed)
Spitting up moderate amt of frothy sputum

## 2013-06-03 NOTE — ED Notes (Signed)
Pt was removed from 4L of o2. Pt o2 sats dropped to 89-90% when removed from 02. Dr Lynelle Doctor made aware and will reassess pt.

## 2013-06-03 NOTE — ED Notes (Signed)
Pt ambulated to nursing station initial o2 on room air was 89% o2 came up to 90%, pt turned around and went back towards his room, o2 dropped to 85% on room air.
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
## 2013-06-03 NOTE — ED Notes (Signed)
Pt vomiting in room. Hospitalist to call me back.

## 2013-06-04 ENCOUNTER — Encounter (HOSPITAL_COMMUNITY): Payer: Self-pay | Admitting: *Deleted

## 2013-06-04 DIAGNOSIS — I5023 Acute on chronic systolic (congestive) heart failure: Principal | ICD-10-CM | POA: Diagnosis present

## 2013-06-04 DIAGNOSIS — E1129 Type 2 diabetes mellitus with other diabetic kidney complication: Secondary | ICD-10-CM

## 2013-06-04 DIAGNOSIS — J9600 Acute respiratory failure, unspecified whether with hypoxia or hypercapnia: Secondary | ICD-10-CM

## 2013-06-04 DIAGNOSIS — J9601 Acute respiratory failure with hypoxia: Secondary | ICD-10-CM

## 2013-06-04 DIAGNOSIS — I5021 Acute systolic (congestive) heart failure: Secondary | ICD-10-CM | POA: Diagnosis present

## 2013-06-04 LAB — GLUCOSE, CAPILLARY
Glucose-Capillary: 203 mg/dL — ABNORMAL HIGH (ref 70–99)
Glucose-Capillary: 216 mg/dL — ABNORMAL HIGH (ref 70–99)
Glucose-Capillary: 195 mg/dL — ABNORMAL HIGH (ref 70–99)
Glucose-Capillary: 156 mg/dL — ABNORMAL HIGH (ref 70–99)
Glucose-Capillary: 130 mg/dL — ABNORMAL HIGH (ref 70–99)

## 2013-06-04 LAB — HEMOGLOBIN A1C
Hgb A1c MFr Bld: 6.7 % — ABNORMAL HIGH (ref <5.7)
Mean Plasma Glucose: 146 mg/dL — ABNORMAL HIGH (ref <117)

## 2013-06-04 LAB — TROPONIN I
Troponin I: <0.3 ng/mL (ref <0.30)
Troponin I: <0.3 ng/mL (ref <0.30)
Troponin I: <0.3 ng/mL (ref <0.30)
Troponin I: <0.3 ng/mL (ref <0.30)

## 2013-06-04 LAB — MAGNESIUM: Magnesium: 1.8 mg/dL (ref 1.5–2.5)

## 2013-06-04 LAB — TSH: TSH: 0.929 u[IU]/mL (ref 0.350–4.500)

## 2013-06-04 MED ORDER — HYDRALAZINE HCL 20 MG/ML IJ SOLN
10.0000 mg | INTRAMUSCULAR | Status: DC | PRN
  Administered 2013-06-04 – 2013-06-06 (×3): 10 mg via INTRAVENOUS
  Filled 2013-06-04 (×3): qty 1

## 2013-06-04 MED ORDER — ONDANSETRON HCL 4 MG/2ML IJ SOLN
4.0000 mg | INTRAMUSCULAR | Status: DC | PRN
  Administered 2013-06-04 – 2013-06-06 (×3): 4 mg via INTRAVENOUS
  Filled 2013-06-04 (×3): qty 2

## 2013-06-04 MED ORDER — FUROSEMIDE 10 MG/ML IJ SOLN
80.0000 mg | Freq: Two times a day (BID) | INTRAMUSCULAR | Status: DC
  Administered 2013-06-04 – 2013-06-07 (×7): 80 mg via INTRAVENOUS
  Filled 2013-06-04 (×7): qty 8

## 2013-06-04 MED ORDER — ATORVASTATIN CALCIUM 20 MG PO TABS
20.0000 mg | ORAL_TABLET | Freq: Every day | ORAL | Status: DC
  Administered 2013-06-04 – 2013-06-07 (×4): 20 mg via ORAL
  Filled 2013-06-04 (×4): qty 1

## 2013-06-04 MED ORDER — TRAZODONE HCL 50 MG PO TABS
150.0000 mg | ORAL_TABLET | Freq: Every day | ORAL | Status: DC
  Administered 2013-06-04 – 2013-06-07 (×5): 150 mg via ORAL
  Filled 2013-06-04 (×5): qty 3

## 2013-06-04 MED ORDER — SODIUM CHLORIDE 0.9 % IV SOLN: 250.0000 mL | INTRAVENOUS | Status: DC | PRN

## 2013-06-04 MED ORDER — ALBUTEROL SULFATE (5 MG/ML) 0.5% IN NEBU: 2.5000 mg | INHALATION_SOLUTION | RESPIRATORY_TRACT | Status: DC | PRN

## 2013-06-04 MED ORDER — METOCLOPRAMIDE HCL 10 MG PO TABS
5.0000 mg | ORAL_TABLET | Freq: Three times a day (TID) | ORAL | Status: DC
  Administered 2013-06-04 – 2013-06-06 (×7): 5 mg via ORAL
  Filled 2013-06-04 (×7): qty 1

## 2013-06-04 MED ORDER — ASPIRIN EC 81 MG PO TBEC
81.0000 mg | DELAYED_RELEASE_TABLET | Freq: Every day | ORAL | Status: DC
  Administered 2013-06-04 – 2013-06-08 (×5): 81 mg via ORAL
  Filled 2013-06-04 (×5): qty 1

## 2013-06-04 MED ORDER — LOSARTAN POTASSIUM 50 MG PO TABS
50.0000 mg | ORAL_TABLET | Freq: Every day | ORAL | Status: DC
  Filled 2013-06-04: qty 1

## 2013-06-04 MED ORDER — CLONAZEPAM 0.5 MG PO TABS
1.0000 mg | ORAL_TABLET | Freq: Three times a day (TID) | ORAL | Status: DC
  Administered 2013-06-04 – 2013-06-08 (×13): 1 mg via ORAL
  Filled 2013-06-04 (×14): qty 2

## 2013-06-04 MED ORDER — SODIUM CHLORIDE 0.9 % IJ SOLN
3.0000 mL | INTRAMUSCULAR | Status: DC | PRN
  Administered 2013-06-06: 3 mL via INTRAVENOUS

## 2013-06-04 MED ORDER — CARVEDILOL 3.125 MG PO TABS
3.1250 mg | ORAL_TABLET | Freq: Two times a day (BID) | ORAL | Status: DC
  Administered 2013-06-04 – 2013-06-08 (×9): 3.125 mg via ORAL
  Filled 2013-06-04 (×9): qty 1

## 2013-06-04 MED ORDER — POTASSIUM CHLORIDE CRYS ER 20 MEQ PO TBCR
40.0000 meq | EXTENDED_RELEASE_TABLET | Freq: Two times a day (BID) | ORAL | Status: DC
  Administered 2013-06-04 – 2013-06-08 (×9): 40 meq via ORAL
  Filled 2013-06-04 (×10): qty 2

## 2013-06-04 MED ORDER — METOCLOPRAMIDE HCL 5 MG/ML IJ SOLN
5.0000 mg | Freq: Four times a day (QID) | INTRAMUSCULAR | Status: DC
  Administered 2013-06-04 (×3): 5 mg via INTRAVENOUS
  Filled 2013-06-04 (×3): qty 2

## 2013-06-04 MED ORDER — NITROGLYCERIN 0.4 MG SL SUBL: 0.4000 mg | SUBLINGUAL_TABLET | SUBLINGUAL | Status: DC | PRN

## 2013-06-04 MED ORDER — SODIUM CHLORIDE 0.9 % IJ SOLN
3.0000 mL | Freq: Two times a day (BID) | INTRAMUSCULAR | Status: DC
  Administered 2013-06-04 – 2013-06-08 (×10): 3 mL via INTRAVENOUS

## 2013-06-04 MED ORDER — INSULIN ASPART 100 UNIT/ML ~~LOC~~ SOLN
0.0000 [IU] | SUBCUTANEOUS | Status: DC
  Administered 2013-06-04: 3 [IU] via SUBCUTANEOUS
  Administered 2013-06-04: 2 [IU] via SUBCUTANEOUS
  Administered 2013-06-04: 3 [IU] via SUBCUTANEOUS
  Administered 2013-06-04: 2 [IU] via SUBCUTANEOUS

## 2013-06-04 MED ORDER — PANTOPRAZOLE SODIUM 40 MG IV SOLR
40.0000 mg | Freq: Two times a day (BID) | INTRAVENOUS | Status: DC
  Administered 2013-06-04 (×2): 40 mg via INTRAVENOUS
  Filled 2013-06-04 (×2): qty 40

## 2013-06-04 MED ORDER — INSULIN ASPART 100 UNIT/ML ~~LOC~~ SOLN
0.0000 [IU] | Freq: Three times a day (TID) | SUBCUTANEOUS | Status: DC
  Administered 2013-06-04 – 2013-06-06 (×5): 1 [IU] via SUBCUTANEOUS
  Administered 2013-06-06: 2 [IU] via SUBCUTANEOUS
  Administered 2013-06-06: 3 [IU] via SUBCUTANEOUS
  Administered 2013-06-07: 1 [IU] via SUBCUTANEOUS
  Administered 2013-06-07: 2 [IU] via SUBCUTANEOUS
  Administered 2013-06-07 – 2013-06-08 (×2): 1 [IU] via SUBCUTANEOUS
  Administered 2013-06-08: 2 [IU] via SUBCUTANEOUS

## 2013-06-04 MED ORDER — ENOXAPARIN SODIUM 40 MG/0.4ML ~~LOC~~ SOLN
40.0000 mg | SUBCUTANEOUS | Status: DC
  Administered 2013-06-04: 40 mg via SUBCUTANEOUS
  Filled 2013-06-04: qty 0.4

## 2013-06-04 NOTE — Progress Notes (Signed)
Pt's BP 202/105. MD has not came to the floor. He is aware that BP is elevated.

## 2013-06-04 NOTE — Progress Notes (Signed)
[facility] PROGRESS NOTE  Cody Hale ZOX:[id number] DOB: 05/18/1951 DOA: 06/03/2013 PCP: Sanda Linger, MD Cardiologist - Rollene Rotunda  Assessment/Plan: 1. Acute systolic congestive heart failure: Secondary to dietary indiscretion, noncompliance with Lasix. Left ventricular ejection fraction 35%. History of multiple hospitalizations for hypotension and overdiuresis. Therefore ACE inhibitor has been held in the past. Continue beta blocker. 2. Acute hypoxic respiratory failure: Secondary to heart failure. Treatment as above. 3. Hypertensive urgency: Secondary to heart failure, high salt load, noncompliance. Treat heart failure. 4. Chest pain with left arm pain: Troponins negative. Suspect secondary to heart failure. No recurrence. No further evaluation suggested. Monitor clinically. 5. Epigastric abdominal pain, vomiting: Likely secondary to gastroparesis. 6. Presumed obstructive sleep apnea, continue BiPAP 15/5 each night 7. Diabetes mellitus type 2: With associated gastroparesis. Stable. 8. 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 Nonobstructive coronary artery disease per catheterization 08/2012 10. History of major depressive disorder  11. Chronic intermittent thrombocytopenia: Follow clinically.   Continue diuresis with IV Lasix. Monitor blood pressure.  Wean oxygen as tolerated. BiPAP each night  Pending studies:   TSH  Hemoglobin A1c  Code Status: Full DVT prophylaxis: SCDs while thrombocytopenic Family Communication: Discussed with wife at bedside Disposition Plan: Home when improved  Brendia Sacks, MD  [facility]  Pager [phone] If 7PM-7AM, please contact night-coverage at [email or web address], password [facility] 06/04/2013, 12:09 PM  LOS: 1 day   Summary: 62 year old man with history of nonobstructive coronary artery disease, left ventricular ejection fraction 35%, and noncompliance, diabetes presented to the emergency department  with abdominal pain, nausea, vomiting and shortness of breath after eating a very large Thanksgiving meal. In the emergency department he was treated with Reglan and Pepcid which resolved his abdominal pain, nausea and vomiting. He was noted to be short of breath, hypoxic on room air and treated for acute heart failure. Of note he had run out of Lasix and had stopped taking it over the last couple of days. He is admitted for acute systolic heart failure.   Consultants:    Procedures:    Antibiotics:    HPI/Subjective: Feels somewhat better today. Breathing better. No nausea, vomiting or abdominal pain. Appetite is poor.  Objective: Filed Vitals:   06/04/13 0411 06/04/13 0500 06/04/13 0930 06/04/13 0953  BP: 156/72  90/48   Pulse: 92  94 93  Temp: 98.9 F (37.2 C)   98.2 F (36.8 C)  TempSrc: Oral   Oral  Resp: 20   20  Height:      Weight:  123.197 kg (271 lb 9.6 oz)    SpO2: 92%   92%    Intake/Output Summary (Last 24 hours) at 06/04/13 1209 Last data filed at 06/04/13 0932  Gross per 24 hour  Intake      3 ml  Output   1000 ml  Net   -997 ml     Filed Weights   06/03/13 1627 06/03/13 2324 06/04/13 0500  Weight: 117.935 kg (260 lb) 123.197 kg (271 lb 9.6 oz) 123.197 kg (271 lb 9.6 oz)    Exam:   Afebrile, labile blood pressures, mostly hypertensive, most recent 90/48. Mild hypoxia appears to be stable.  Appears calm, comfortable.  Cardiovascular: Regular rate and rhythm. No murmur, rub or gallop. 1+ bilateral lower extremity edema.  Respiratory: Clear to auscultation bilaterally. No wheezes, rales or rhonchi. Somewhat diminished breath sounds. Moderate increased respiratory effort.  Abdomen: Soft, nontender, nondistended.  Psychiatric: Grossly normal mood and affect. Speech fluent  and appropriate.   Telemetry: Sinus rhythm.  Data Reviewed:  Capillary blood sugars stable  -1 L. Laboratory studies from last night: Basic metabolic panel unremarkable.  Hepatic function panel normal. Magnesium normal. Troponins negative.    CBC notable for thrombocytopenia, 99  Chest x-ray with mild to moderate heart failure    EKG this a.m. sinus rhythm, left bundle branch block, compared to previous studies left bundle branch block is old. No acute changes are seen.  Scheduled Meds: . aspirin EC  81 mg Oral Daily  . atorvastatin  20 mg Oral q1800  . carvedilol  3.125 mg Oral BID WC  . clonazePAM  1 mg Oral TID  . enoxaparin (LOVENOX) injection  40 mg Subcutaneous Q24H  . furosemide  80 mg Intravenous Q12H  . insulin aspart  0-9 Units Subcutaneous Q4H  . losartan  50 mg Oral Daily  . metoCLOPramide (REGLAN) injection  5 mg Intravenous Q6H  . pantoprazole (PROTONIX) IV  40 mg Intravenous Q12H  . potassium chloride  40 mEq Oral BID  . sodium chloride  3 mL Intravenous Q12H  . traZODone  150 mg Oral QHS   Continuous Infusions:   Active Problems:   Morbid obesity   OSA (obstructive sleep apnea)   Diabetic gastroparesis   Type II or unspecified type diabetes mellitus with renal manifestations, uncontrolled(250.42)   Status post mitral valve replacement   CHF (congestive heart failure)   Acute on chronic systolic heart failure   Time spent 20 minutes

## 2013-06-05 LAB — TROPONIN I
Troponin I: <0.3 ng/mL (ref <0.30)
Troponin I: <0.3 ng/mL (ref <0.30)

## 2013-06-05 LAB — CBC
HCT: 40.9 % (ref 39.0–52.0)
Hemoglobin: 13.1 g/dL (ref 13.0–17.0)
MCH: 29.4 pg (ref 26.0–34.0)
MCHC: 32 g/dL (ref 30.0–36.0)
MCV: 91.9 fL (ref 78.0–100.0)
Platelets: 112 10*3/uL — ABNORMAL LOW (ref 150–400)
RBC: 4.45 MIL/uL (ref 4.22–5.81)
RDW: 14.6 % (ref 11.5–15.5)
WBC: 7.5 10*3/uL (ref 4.0–10.5)

## 2013-06-05 LAB — GLUCOSE, CAPILLARY
Glucose-Capillary: 132 mg/dL — ABNORMAL HIGH (ref 70–99)
Glucose-Capillary: 132 mg/dL — ABNORMAL HIGH (ref 70–99)
Glucose-Capillary: 127 mg/dL — ABNORMAL HIGH (ref 70–99)
Glucose-Capillary: 122 mg/dL — ABNORMAL HIGH (ref 70–99)
Glucose-Capillary: 143 mg/dL — ABNORMAL HIGH (ref 70–99)

## 2013-06-05 LAB — BASIC METABOLIC PANEL
BUN: 19 mg/dL (ref 6–23)
CO2: 32 mEq/L (ref 19–32)
Calcium: 9 mg/dL (ref 8.4–10.5)
Chloride: 100 mEq/L (ref 96–112)
Creatinine, Ser: 0.96 mg/dL (ref 0.50–1.35)
GFR calc Af Amer: >90 mL/min (ref >90)
GFR calc non Af Amer: 87 mL/min — ABNORMAL LOW (ref >90)
Glucose, Bld: 147 mg/dL — ABNORMAL HIGH (ref 70–99)
Potassium: 4 mEq/L (ref 3.5–5.1)
Sodium: 139 mEq/L (ref 135–145)

## 2013-06-05 NOTE — Progress Notes (Signed)
PT had nausea and vomited will hold BiPAP for now.

## 2013-06-05 NOTE — Progress Notes (Signed)
[facility] PROGRESS NOTE  Cody Hale [id number] DOB: September 23, 1950 DOA: 06/03/2013 PCP: Sanda Linger, MD Cardiologist - Rollene Rotunda  Assessment/Plan: 1. Acute systolic congestive heart failure: Improving. Secondary to dietary indiscretion, noncompliance with Lasix. Left ventricular ejection fraction 35%. History of multiple hospitalizations for hypotension and overdiuresis. Therefore ACE inhibitor has been held in the past. Continue beta blocker. 2. Acute hypoxic respiratory failure: Secondary to heart failure. Treatment as above. Wean oxygen as tolerated. 3. Hypertensive urgency: Resolved. Secondary to heart failure, high salt load, noncompliance. 4. Chest pain with left arm pain: Troponins negative. Suspect secondary to heart failure. No recurrence. No further evaluation suggested. Monitor clinically. 5. Epigastric abdominal pain, vomiting: Likely secondary to gastroparesis. Resolved. Continue Reglan. 6. Presumed obstructive sleep apnea, continue BiPAP 15/5 each night 7. Diabetes mellitus type 2: Stable. With associated gastroparesis. Stable. Hemoglobin A1c 6.7 8. 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 Nonobstructive coronary artery disease per catheterization 08/2012 10. History of major depressive disorder  11. Chronic intermittent thrombocytopenia: Stable. Follow clinically.   Wean oxygen as tolerated  Continue Lasix IV, beta blocker  Check magnesium and BMP in the morning  Pending studies:   None  Code Status: Full DVT prophylaxis: SCDs while thrombocytopenic Family Communication: Discussed with wife at bedside Disposition Plan: Home when improved  Brendia Sacks, MD  [facility]  Pager [phone] If 7PM-7AM, please contact night-coverage at [email or web address], password [facility] 06/05/2013, 2:05 PM  LOS: 2 days   Summary: 62 year old man with history of nonobstructive coronary artery disease, left ventricular ejection fraction 35%,  and noncompliance, diabetes presented to the emergency department with abdominal pain, nausea, vomiting and shortness of breath after eating a very large Thanksgiving meal. In the emergency department he was treated with Reglan and Pepcid which resolved his abdominal pain, nausea and vomiting. He was noted to be short of breath, hypoxic on room air and treated for acute heart failure. Of note he had run out of Lasix and had stopped taking it over the last couple of days. He is admitted for acute systolic heart failure.   Consultants:    Procedures:    Antibiotics:    HPI/Subjective: Feeling better. Breathing better. Eating better. No nausea or vomiting.  Objective: Filed Vitals:   06/05/13 0225 06/05/13 0432 06/05/13 0816 06/05/13 1016  BP: 119/63 120/51  132/64  Pulse: 75 82  66  Temp: 98.2 F (36.8 C) 97.7 F (36.5 C)  98.2 F (36.8 C)  TempSrc: Oral Oral  Oral  Resp: 20 20  20   Height:      Weight:  124.104 kg (273 lb 9.6 oz)    SpO2: 93% 92% 91% 98%    Intake/Output Summary (Last 24 hours) at 06/05/13 1405 Last data filed at 06/05/13 1140  Gross per 24 hour  Intake    603 ml  Output   3825 ml  Net  -3222 ml     Filed Weights   06/03/13 2324 06/04/13 0500 06/05/13 0432  Weight: 123.197 kg (271 lb 9.6 oz) 123.197 kg (271 lb 9.6 oz) 124.104 kg (273 lb 9.6 oz)    Exam:   Afebrile, vital signs stable. Mild hypoxia.  General: Appears calm and comfortable. Sitting in chair eating lunch. Nontoxic.  Cardiovascular: Regular rate and rhythm. No murmur, rub or gallop. 1+ bilateral lower extremity edema.  Telemetry: Sinus rhythm. 6 beat V. tach.  Respiratory: Clear to auscultation bilaterally. No wheezes, rales or rhonchi. Normal respiratory effort.  Psychiatric: Grossly normal mood  and affect. Speech fluent and appropriate.  Data Reviewed:  Weight without significant change  -3.7 L  Capillary blood sugars stable  Troponins negative  Basic metabolic  panel and CBC unremarkable except for platelet count 112  Scheduled Meds: . aspirin EC  81 mg Oral Daily  . atorvastatin  20 mg Oral q1800  . carvedilol  3.125 mg Oral BID WC  . clonazePAM  1 mg Oral TID  . furosemide  80 mg Intravenous Q12H  . insulin aspart  0-9 Units Subcutaneous TID WC  . metoCLOPramide  5 mg Oral TID AC & HS  . potassium chloride  40 mEq Oral BID  . sodium chloride  3 mL Intravenous Q12H  . traZODone  150 mg Oral QHS   Continuous Infusions:   Principal Problem:   Acute on chronic systolic heart failure Active Problems:   Morbid obesity   OSA (obstructive sleep apnea)   Diabetic gastroparesis   Type II or unspecified type diabetes mellitus with renal manifestations, uncontrolled(250.42)   Status post mitral valve replacement   Acute respiratory failure with hypoxia   Acute systolic CHF (congestive heart failure)   Time spent 15 minutes

## 2013-06-06 LAB — TROPONIN I
Troponin I: <0.3 ng/mL (ref <0.30)
Troponin I: <0.3 ng/mL (ref <0.30)
Troponin I: <0.3 ng/mL (ref <0.30)
Troponin I: <0.3 ng/mL (ref <0.30)

## 2013-06-06 LAB — BASIC METABOLIC PANEL
BUN: 18 mg/dL (ref 6–23)
CO2: 33 mEq/L — ABNORMAL HIGH (ref 19–32)
Calcium: 9.5 mg/dL (ref 8.4–10.5)
Chloride: 94 mEq/L — ABNORMAL LOW (ref 96–112)
Creatinine, Ser: 0.85 mg/dL (ref 0.50–1.35)
GFR calc Af Amer: >90 mL/min (ref >90)
GFR calc non Af Amer: >90 mL/min (ref >90)
Glucose, Bld: 234 mg/dL — ABNORMAL HIGH (ref 70–99)
Potassium: 3.9 mEq/L (ref 3.5–5.1)
Sodium: 136 mEq/L (ref 135–145)

## 2013-06-06 LAB — GLUCOSE, CAPILLARY
Glucose-Capillary: 211 mg/dL — ABNORMAL HIGH (ref 70–99)
Glucose-Capillary: 183 mg/dL — ABNORMAL HIGH (ref 70–99)
Glucose-Capillary: 121 mg/dL — ABNORMAL HIGH (ref 70–99)
Glucose-Capillary: 117 mg/dL — ABNORMAL HIGH (ref 70–99)

## 2013-06-06 MED ORDER — METOCLOPRAMIDE HCL 5 MG/ML IJ SOLN
5.0000 mg | Freq: Four times a day (QID) | INTRAMUSCULAR | Status: DC
  Administered 2013-06-06 – 2013-06-07 (×5): 5 mg via INTRAVENOUS
  Filled 2013-06-06 (×5): qty 2

## 2013-06-06 MED ORDER — POLYETHYLENE GLYCOL 3350 17 G PO PACK
17.0000 g | PACK | Freq: Every day | ORAL | Status: DC
  Administered 2013-06-06 – 2013-06-08 (×3): 17 g via ORAL
  Filled 2013-06-06 (×3): qty 1

## 2013-06-06 MED ORDER — MILK AND MOLASSES ENEMA
Freq: Once | RECTAL | Status: AC
  Administered 2013-06-06: 13:00:00 via RECTAL

## 2013-06-06 MED ORDER — DOCUSATE SODIUM 100 MG PO CAPS
100.0000 mg | ORAL_CAPSULE | Freq: Two times a day (BID) | ORAL | Status: DC
  Administered 2013-06-06 – 2013-06-08 (×5): 100 mg via ORAL
  Filled 2013-06-06 (×5): qty 1

## 2013-06-06 MED ORDER — ENOXAPARIN SODIUM 40 MG/0.4ML ~~LOC~~ SOLN
40.0000 mg | SUBCUTANEOUS | Status: DC
  Administered 2013-06-06 – 2013-06-07 (×2): 40 mg via SUBCUTANEOUS
  Filled 2013-06-06 (×2): qty 0.4

## 2013-06-06 NOTE — Progress Notes (Signed)
Spoke with MD regarding BP after giving hydralazine. No further treatments recommended. Will continue to monitor.

## 2013-06-06 NOTE — Progress Notes (Signed)
Patient had about 2 minute run of rapid afib on telemetry, asleep at the time, asymptomatic when patient was awaken, BP 142/59, MD made aware.

## 2013-06-06 NOTE — Progress Notes (Signed)
[facility] PROGRESS NOTE  Cody Hale [id number] DOB: May 18, 1951 DOA: 06/03/2013 PCP: Sanda Linger, MD Cardiologist - Rollene Rotunda  Assessment/Plan: 1. Acute systolic congestive heart failure: Improving. Excellent urine output, -3.8 L. Total -5.8 L. Weight down approximately 4 kg since admission. Creatinine preserved. Still some lower extremity edema and hypoxia. Continue current therapy. Continue Coreg 2. Acute hypoxic respiratory failure: Secondary to acute heart failure. Wean oxygen as tolerated. 3. Hypertensive urgency: Largely resolved, secondary to heart failure, high salt low, noncompliance. 4. Chest pain with left arm pain on admission: No recurrence. Troponins negative. Suspect secondary to heart failure. No further evaluation at this point. 5. Recurrent abdominal pain with vomiting: Epigastric on admission. No recurrence of epigastric pain. No right upper quadrant pain. Lower abdominal pain consistent with history of constipation. Bowel regimen. Gastroparesis diet. Continue Reglan. No history to suggest cardiac etiology. 6. Diabetes mellitus type 2: Associated gastroparesis. Sugars stable. Continue current therapy. Hemoglobin A1c 6.7. 7. Presented chart sleep apnea: Continue BiPAP each night. 8. Nonobstructive coronary artery disease per catheterization 08/2012: continue ASA, Lipitor. 9. Status post St. Jude prosthetic mitral valve replacement 09/2012 10. Chronic intermittent thrombocytopenia: Stable. Follow clinically. 11. Constipation: Likely the cause of his lower abdominal pain. Bowel regimen.   Continue IV Lasix, daily basic metabolic panel.  Enema, bowel regimen  Clear liquids   CBC in a.m.  Pending studies:   None  Code Status: Full DVT prophylaxis: SCDs while thrombocytopenic Family Communication: none present Disposition Plan: Home when improved  Brendia Sacks, MD  [facility]  Pager [phone] If 7PM-7AM, please contact night-coverage  at [email or web address], password [facility] 06/06/2013, 8:42 AM  LOS: 3 days   Summary: 62 year old man with history of nonobstructive coronary artery disease, left ventricular ejection fraction 35%, and noncompliance, diabetes presented to the emergency department with abdominal pain, nausea, vomiting and shortness of breath after eating a very large Thanksgiving meal. In the emergency department he was treated with Reglan and Pepcid which resolved his abdominal pain, nausea and vomiting. He was noted to be short of breath, hypoxic on room air and treated for acute heart failure. Of note he had run out of Lasix and had stopped taking it over the last couple of days. He is admitted for acute systolic heart failure.   Consultants:    Procedures:    Antibiotics:    HPI/Subjective: Vomited x2 last night. No cp, sob, leg pain. C/o lower ab pain. No epigastric or ruq pain. Constipated, can't remember last bm.  Objective: Filed Vitals:   06/06/13 0408 06/06/13 0500 06/06/13 0538 06/06/13 0637  BP: 174/72 170/82 168/86 169/70  Pulse: 79  80 83  Temp: 98 F (36.7 C)     TempSrc: Oral     Resp: 20     Height:      Weight: 119.75 kg (264 lb)     SpO2: 93%       Intake/Output Summary (Last 24 hours) at 06/06/13 0842 Last data filed at 06/06/13 0137  Gross per 24 hour  Intake   1083 ml  Output   4925 ml  Net  -3842 ml     Filed Weights   06/04/13 0500 06/05/13 0432 06/06/13 0408  Weight: 123.197 kg (271 lb 9.6 oz) 124.104 kg (273 lb 9.6 oz) 119.75 kg (264 lb)    Exam:   Afebrile, hypertensive. Mild hypoxia persists.  General: Appears calm, mildly uncomfortable. Nontoxic.  Cardiovascular: Regular rate and rhythm. No murmur, rub or gallop. 1+ bilateral lower extremity  edema right greater than left.  Respiratory: Clear to auscultation bilaterally. No wheezes, rales or rhonchi. Normal respiratory effort.  abd soft, nd, no epigastric or ruq pain, mild lower abd pain  Psychiatric:  Grossly normal mood and affect. Speech fluent and appropriate.  Telemetry: Sinus rhythm.  Data Reviewed:  Capillary blood sugars stable  Basic metabolic panel unremarkable  Troponin negative  Scheduled Meds: . aspirin EC  81 mg Oral Daily  . atorvastatin  20 mg Oral q1800  . carvedilol  3.125 mg Oral BID WC  . clonazePAM  1 mg Oral TID  . furosemide  80 mg Intravenous Q12H  . insulin aspart  0-9 Units Subcutaneous TID WC  . metoCLOPramide  5 mg Oral TID AC & HS  . potassium chloride  40 mEq Oral BID  . sodium chloride  3 mL Intravenous Q12H  . traZODone  150 mg Oral QHS   Continuous Infusions:   Principal Problem:   Acute on chronic systolic heart failure Active Problems:   Morbid obesity   OSA (obstructive sleep apnea)   Diabetic gastroparesis   Type II or unspecified type diabetes mellitus with renal manifestations, uncontrolled(250.42)   Status post mitral valve replacement   Acute respiratory failure with hypoxia   Acute systolic CHF (congestive heart failure)   Time spent 20 minutes

## 2013-06-07 ENCOUNTER — Other Ambulatory Visit: Payer: Self-pay | Admitting: Family Medicine

## 2013-06-07 LAB — BASIC METABOLIC PANEL
BUN: 22 mg/dL (ref 6–23)
CO2: 35 mEq/L — ABNORMAL HIGH (ref 19–32)
Calcium: 9.4 mg/dL (ref 8.4–10.5)
Chloride: 100 mEq/L (ref 96–112)
Creatinine, Ser: 0.91 mg/dL (ref 0.50–1.35)
GFR calc Af Amer: >90 mL/min (ref >90)
GFR calc non Af Amer: 89 mL/min — ABNORMAL LOW (ref >90)
Glucose, Bld: 157 mg/dL — ABNORMAL HIGH (ref 70–99)
Potassium: 4.1 mEq/L (ref 3.5–5.1)
Sodium: 142 mEq/L (ref 135–145)

## 2013-06-07 LAB — GLUCOSE, CAPILLARY
Glucose-Capillary: 131 mg/dL — ABNORMAL HIGH (ref 70–99)
Glucose-Capillary: 140 mg/dL — ABNORMAL HIGH (ref 70–99)
Glucose-Capillary: 178 mg/dL — ABNORMAL HIGH (ref 70–99)
Glucose-Capillary: 166 mg/dL — ABNORMAL HIGH (ref 70–99)
Glucose-Capillary: 202 mg/dL — ABNORMAL HIGH (ref 70–99)

## 2013-06-07 LAB — CBC
HCT: 42.5 % (ref 39.0–52.0)
Hemoglobin: 13.8 g/dL (ref 13.0–17.0)
MCH: 29.9 pg (ref 26.0–34.0)
MCHC: 32.5 g/dL (ref 30.0–36.0)
MCV: 92.2 fL (ref 78.0–100.0)
Platelets: 119 10*3/uL — ABNORMAL LOW (ref 150–400)
RBC: 4.61 MIL/uL (ref 4.22–5.81)
RDW: 14.3 % (ref 11.5–15.5)
WBC: 5.7 10*3/uL (ref 4.0–10.5)

## 2013-06-07 LAB — TROPONIN I
Troponin I: <0.3 ng/mL (ref <0.30)
Troponin I: <0.3 ng/mL (ref <0.30)

## 2013-06-07 LAB — MAGNESIUM: Magnesium: 1.9 mg/dL (ref 1.5–2.5)

## 2013-06-07 MED ORDER — FUROSEMIDE 40 MG PO TABS
40.0000 mg | ORAL_TABLET | Freq: Two times a day (BID) | ORAL | Status: DC
  Administered 2013-06-07 – 2013-06-08 (×2): 40 mg via ORAL
  Filled 2013-06-07 (×2): qty 1

## 2013-06-07 MED ORDER — METOCLOPRAMIDE HCL 10 MG PO TABS
5.0000 mg | ORAL_TABLET | Freq: Three times a day (TID) | ORAL | Status: DC
  Administered 2013-06-07 – 2013-06-08 (×4): 5 mg via ORAL
  Filled 2013-06-07 (×4): qty 1

## 2013-06-07 NOTE — Care Management Note (Addendum)
    Page 1 of 1   06/08/2013     2:04:04 PM   CARE MANAGEMENT NOTE 06/08/2013  Patient:  Cody Hale, Cody Hale   Account Number:  [id number]  Date Initiated:  06/07/2013  Documentation initiated by:  Sharrie Rothman  Subjective/Objective Assessment:   Pt admitted from home with CHF. Pt lives with his wife and will return home at discharge. Pt has cpap from [facility] but no home O2 at this time. Pt will return home at discharge. Has used [facility] in the past.     Action/Plan:   Pt may need resumption of HH at discharge. Will assess need for home O2 at discharge.   Anticipated DC Date:  06/11/2013   Anticipated DC Plan:  HOME W HOME HEALTH SERVICES      DC Planning Services  CM consult      Choice offered to / List presented to:             Status of service:  Completed, signed off Medicare Important Message given?   (If response is "NO", the following Medicare IM given date fields will be blank) Date Medicare IM given:   Date Additional Medicare IM given:    Discharge Disposition:  HOME/SELF CARE  Per UR Regulation:    If discussed at Long Length of Stay Meetings, dates discussed:   06/08/2013    Comments:  06/08/13 1400 Arlyss Queen, RN BSN CM Pt discharged home today. Pt and pts wife deny the need for HH at this time. CM did explain to pt and pts wife that if they change their mind after discharge to call their PCP to arrange.  06/07/13 1345 Arlyss Queen, RN BSN CM

## 2013-06-07 NOTE — Telephone Encounter (Signed)
Pharmacy request for citalopram for patient.  Patient not our patient.  Faxed request back to pharmacy.

## 2013-06-07 NOTE — Progress Notes (Signed)
[facility] PROGRESS NOTE  Cody Hale [id number] DOB: June 29, 1951 DOA: 06/03/2013 PCP: Sanda Linger, MD Cardiologist - Rollene Rotunda  Assessment/Plan: 1. Acute systolic congestive heart failure: rapidly improving, Weight down 7 kg, creatinine improved. Change to oral Lasix. Appears compensated now. 2. Acute hypoxic respiratory failure: stable, secondary to acute heart failure. Wean oxygen. 3. Hypertensive urgency: resolved, secondary to CHF, high salt load, non-compliance, pain. 4. Chest pain with left arm pain on admission: no recurrence, troponins negative. Suspect secondary to heart failure. 5. Recurrent abd pain with vomiting: epigastric on admission. Pain free today, no n/v. Tolerating diet. Continue reglan for gastroparesis, change to oral. 6. OSA: continue BiPAP each night. 7. Nonobstructive coronary artery disease per catheterization 08/2012: continue ASA, Lipitor.  8. Status post St. Jude prosthetic mitral valve replacement 09/2012  9. Chronic intermittent thrombocytopenia: Stable. Follow clinically.  10. Constipation   Change to oral Lasix  Wean oxygen  Pending studies:   None  Code Status: Full DVT prophylaxis: Lovenox Family Communication: none present Disposition Plan: Home when improved  Brendia Sacks, MD  [facility]  Pager [phone] If 7PM-7AM, please contact night-coverage at [email or web address], password [facility] 06/07/2013, 10:36 AM  LOS: 4 days   Summary: 62 year old man with history of nonobstructive coronary artery disease, left ventricular ejection fraction 35%, and noncompliance, diabetes presented to the emergency department with abdominal pain, nausea, vomiting and shortness of breath after eating a very large Thanksgiving meal. In the emergency department he was treated with Reglan and Pepcid which resolved his abdominal pain, nausea and vomiting. He was noted to be short of breath, hypoxic on room air and treated for acute heart failure. Of  note he had run out of Lasix and had stopped taking it over the last couple of days. He is admitted for acute systolic heart failure.   Consultants:    Procedures:    Antibiotics:    HPI/Subjective: Had an episode of SVT yesterday. Has history of paroxysmal SVT. Feels better today, no n/v/abd pain. Tolerating clears, wants to advance diet. No cp or sob. Feels much better.  Objective: Filed Vitals:   06/06/13 1438 06/06/13 1831 06/06/13 2008 06/07/13 0632  BP: 129/42 134/57 155/65 127/67  Pulse: 83 65 58 61  Temp: 97.8 F (36.6 C) 97.2 F (36.2 C) 98.3 F (36.8 C) 98 F (36.7 C)  TempSrc: Oral Oral Oral Oral  Resp: 20 19 20 20   Height:      Weight:    116.801 kg (257 lb 8 oz)  SpO2: 87% 100% 98% 100%    Intake/Output Summary (Last 24 hours) at 06/07/13 1036 Last data filed at 06/07/13 0900  Gross per 24 hour  Intake    713 ml  Output    700 ml  Net     13 ml     Filed Weights   06/05/13 0432 06/06/13 0408 06/07/13 0632  Weight: 124.104 kg (273 lb 9.6 oz) 119.75 kg (264 lb) 116.801 kg (257 lb 8 oz)    Exam:   Afebrile, VSS. Mild hypoxia.  General: appears calm and comfortable  CV: RRR, no m/r/g, minimal LE edema  Respiratory: CTA bilaterally no w/w/r. Normal respiratory effort  Abd: soft, ntnd  Data Reviewed:  Capillary blood sugars stable  Basic metabolic panel unremarkable. Magnesium and potassium normal.  Troponin negative  CBC unremarkable. Stable thrombocytopenia.  Scheduled Meds: . aspirin EC  81 mg Oral Daily  . atorvastatin  20 mg Oral q1800  . carvedilol  3.125 mg Oral  BID WC  . clonazePAM  1 mg Oral TID  . docusate sodium  100 mg Oral BID  . enoxaparin (LOVENOX) injection  40 mg Subcutaneous Q24H  . furosemide  80 mg Intravenous Q12H  . insulin aspart  0-9 Units Subcutaneous TID WC  . metoCLOPramide (REGLAN) injection  5 mg Intravenous Q6H  . polyethylene glycol  17 g Oral Daily  . potassium chloride  40 mEq Oral BID  . sodium  chloride  3 mL Intravenous Q12H  . traZODone  150 mg Oral QHS   Continuous Infusions:   Principal Problem:   Acute on chronic systolic heart failure Active Problems:   Morbid obesity   OSA (obstructive sleep apnea)   Diabetic gastroparesis   Type II or unspecified type diabetes mellitus with renal manifestations, uncontrolled(250.42)   Status post mitral valve replacement   Acute respiratory failure with hypoxia   Acute systolic CHF (congestive heart failure)   Time spent 20 minutes

## 2013-06-08 DIAGNOSIS — I509 Heart failure, unspecified: Secondary | ICD-10-CM

## 2013-06-08 DIAGNOSIS — I5021 Acute systolic (congestive) heart failure: Secondary | ICD-10-CM

## 2013-06-08 LAB — GLUCOSE, CAPILLARY
Glucose-Capillary: 137 mg/dL — ABNORMAL HIGH (ref 70–99)
Glucose-Capillary: 167 mg/dL — ABNORMAL HIGH (ref 70–99)

## 2013-06-08 MED ORDER — POTASSIUM CHLORIDE CRYS ER 20 MEQ PO TBCR: 20.0000 meq | EXTENDED_RELEASE_TABLET | Freq: Every day | ORAL | Status: DC

## 2013-06-08 MED ORDER — FUROSEMIDE 40 MG PO TABS: 40.0000 mg | ORAL_TABLET | Freq: Two times a day (BID) | ORAL | Status: DC

## 2013-06-08 NOTE — Progress Notes (Signed)
Patient states understanding of discharge, prescriptions given 

## 2013-06-08 NOTE — Progress Notes (Signed)
[facility] PROGRESS NOTE  ORMAND SENN ZOX:[id number] DOB: 09/26/1950 DOA: 06/03/2013 PCP: Sanda Linger, MD Cardiologist - Rollene Rotunda  Assessment/Plan: 1. Acute systolic congestive heart failure: Resolved. Creatinine stable. Continue oral Lasix. 2. Acute hypoxic respiratory failure: Resolved, secondary to acute heart failure. 3. Hypertensive urgency: Resolved, secondary to CHF, high salt lobe, noncompliance, pain. 4. Chest pain with left arm pain on admission: No recurrence. Troponins negative. Secondary to heart failure. No further evaluation at this time. 5. Recurrent abdominal pain with vomiting: Epigastric on admission. Pain-free, no nausea or vomiting. Tolerating diet. Secondary to gastroparesis. 6. Obstructive sleep apnea: Stable. Continue BiPAP at night. 7. Chronic intermittent from subcutaneous: Stable. Followup as an outpatient.   Home today  Followup with cardiology already arranged  Consider ACE inhibitor as an outpatient a blood pressure can tolerate.  Pending studies:   None  Code Status: Full DVT prophylaxis: Lovenox Family Communication: none present Disposition Plan: Home when improved  Brendia Sacks, MD  [facility]  Pager [phone] If 7PM-7AM, please contact night-coverage at [email or web address], password [facility] 06/08/2013, 11:29 AM  LOS: 5 days   Summary: 62 year old man with history of nonobstructive coronary artery disease, left ventricular ejection fraction 35%, and noncompliance, diabetes presented to the emergency department with abdominal pain, nausea, vomiting and shortness of breath after eating a very large Thanksgiving meal. In the emergency department he was treated with Reglan and Pepcid which resolved his abdominal pain, nausea and vomiting. He was noted to be short of breath, hypoxic on room air and treated for acute heart failure. Of note he had run out of Lasix and had stopped taking it over the last couple of days. He is admitted  for acute systolic heart failure.   Consultants:  none  Procedures:  none  Antibiotics:  none  HPI/Subjective: Feels very well. "Can I go home". No nausea, vomiting or abdominal pain. Breathing well. No complaints.  Objective: Filed Vitals:   06/07/13 1939 06/07/13 1945 06/07/13 2042 06/08/13 0500  BP: 106/55  134/55 128/71  Pulse: 62  64 87  Temp: 99.2 F (37.3 C)  98.4 F (36.9 C) 97.8 F (36.6 C)  TempSrc: Oral  Oral Oral  Resp: 20  20 20   Height:      Weight:    118.7 kg (261 lb 11 oz)  SpO2: 100% 96% 98% 95%    Intake/Output Summary (Last 24 hours) at 06/08/13 1129 Last data filed at 06/08/13 0843  Gross per 24 hour  Intake    960 ml  Output    400 ml  Net    560 ml     Filed Weights   06/06/13 0408 06/07/13 0632 06/08/13 0500  Weight: 119.75 kg (264 lb) 116.801 kg (257 lb 8 oz) 118.7 kg (261 lb 11 oz)    Exam:   Afebrile, VSS. Hypoxia has resolved.  General: Appears calm and comfortable. Sitting in chair. Appears well.  Cardiovascular: Regular rate and rhythm. No murmur, rub or gallop. Trace bilateral lower extremity edema.  Respiratory: Clear to auscultation bilaterally. No wheezes, rales or rhonchi. Normal respiratory effort.  Abdomen: Soft, nontender, nondistended  Data Reviewed:  Capillary blood sugars stable  Weight stable. Adequate urine output.  Scheduled Meds: . aspirin EC  81 mg Oral Daily  . atorvastatin  20 mg Oral q1800  . carvedilol  3.125 mg Oral BID WC  . clonazePAM  1 mg Oral TID  . docusate sodium  100 mg Oral BID  . enoxaparin (LOVENOX) injection  40 mg Subcutaneous Q24H  . furosemide  40 mg Oral BID  . insulin aspart  0-9 Units Subcutaneous TID WC  . metoCLOPramide  5 mg Oral TID AC & HS  . polyethylene glycol  17 g Oral Daily  . potassium chloride  40 mEq Oral BID  . sodium chloride  3 mL Intravenous Q12H  . traZODone  150 mg Oral QHS   Continuous Infusions:   Principal Problem:   Acute on chronic systolic  heart failure Active Problems:   Morbid obesity   OSA (obstructive sleep apnea)   Diabetic gastroparesis   Type II or unspecified type diabetes mellitus with renal manifestations, uncontrolled(250.42)   Status post mitral valve replacement   Acute respiratory failure with hypoxia   Acute systolic CHF (congestive heart failure)

## 2013-06-08 NOTE — Progress Notes (Signed)
Patient maintains an 02 sat of 96% on ra at rest and with ambulation.

## 2013-06-08 NOTE — Progress Notes (Signed)
UR chart review completed.  

## 2013-06-08 NOTE — Discharge Summary (Signed)
Physician Discharge Summary  DEZMAN GRANDA [id number]:[id number] DOB: 1951/03/19 DOA: 06/03/2013  PCP: Sanda Linger, MD  Admit date: 06/03/2013 Discharge date: 06/08/2013  Recommendations for Outpatient Follow-up:  1. Followup noncompliance with diet. Systolic heart failure. 2. Consider repeat basic metabolic panel in one week on diuretic therapy. 3. Consider ACE inhibitor as an outpatient a blood pressure can tolerate.   Follow-up Information   Follow up with Sanda Linger, MD.   Specialty:  Internal Medicine   Contact information:   [address] [place] [address] [phone]       Follow up with Laqueta Linden, MD.   Specialty:  Cardiology   Contact information:   [address] [place] [address] [phone]       Follow up with Sanda Linger, MD In 1 week.   Specialty:  Internal Medicine   Contact information:   [address] [place] [address] [phone]      Discharge Diagnoses:  1. Acute systolic congestive heart failure 2. Acute hypoxic respiratory failure 3. Hypertensive urgency 4. Chest pain 5. Abdominal pain with vomiting 6. Chronic intermittent thrombocytopenia  Discharge Condition: Improved Disposition: Home  Diet recommendation: Heart healthy  Filed Weights   06/06/13 0408 06/07/13 3329 06/08/13 0500  Weight: 119.75 kg (264 lb) 116.801 kg (257 lb 8 oz) 118.7 kg (261 lb 11 oz)    History of present illness:  62 year old man with history of nonobstructive coronary artery disease, left ventricular ejection fraction 35%, and noncompliance, diabetes presented to the emergency department with abdominal pain, nausea, vomiting and shortness of breath after eating a very large Thanksgiving meal. In the emergency department he was treated with Reglan and Pepcid which resolved his abdominal pain, nausea and vomiting. He was noted to be short of breath, hypoxic on room air and treated  for acute heart failure. Of note he had run out of Lasix and had stopped taking it over the last couple of days. He is admitted for acute systolic heart failure.    Hospital Course:  Mr. Steuber was treated with IV diuretics with excellent weight loss and diuresis. Hypoxic respiratory failure resolved and heart failure stabilized at this point. Hospitalization was complicated by recurrent abdominal pain secondary to gastroparesis. At this point is stable for discharge. See individual issues below.  1. Acute systolic congestive heart failure: Resolved. Creatinine stable. Continue oral Lasix. 2. Acute hypoxic respiratory failure: Resolved, secondary to acute heart failure. 3. Hypertensive urgency: Resolved, secondary to CHF, high salt lobe, noncompliance, pain. 4. Chest pain with left arm pain on admission: No recurrence. Troponins negative. Secondary to heart failure. No further evaluation at this time. 5. Recurrent abdominal pain with vomiting: Epigastric on admission. Pain-free, no nausea or vomiting. Tolerating diet. Secondary to gastroparesis. 6. Obstructive sleep apnea: Stable. Continue BiPAP at night. 7. Chronic intermittent thrombocytopenia: Stable. Followup as an outpatient.  Consultants:  none Procedures:  none Antibiotics:  none   Discharge Instructions  Discharge Orders   Future Appointments Provider Department Dept Phone   06/18/2013 2:30 PM [name], MD [facility] : [facility] [phone]   06/28/2013 1:00 PM Laqueta Linden, MD [facility] [phone]   Future Orders Complete By Expires   Activity as tolerated - No restrictions  As directed    Diet - low sodium heart healthy  As directed    Discharge instructions  As directed    Comments:  Clear physician or seek immediate medical assistance for shortness of breath, worsening of condition.       Medication List         albuterol (5 MG/ML) 0.5% nebulizer solution  Commonly  known as:  PROVENTIL  Take 0.5 mL (2.5 mg total) by nebulization every 2 (two) hours as needed for wheezing.     aspirin EC 81 MG tablet  Take 81 mg by mouth daily.     carvedilol 3.125 MG tablet  Commonly known as:  COREG  Take 1 tablet (3.125 mg total) by mouth 2 (two) times daily with a meal.     clonazePAM 1 MG tablet  Commonly known as:  KLONOPIN  Take 1 tablet (1 mg total) by mouth 3 (three) times daily.     furosemide 40 MG tablet  Commonly known as:  LASIX  Take 1 tablet (40 mg total) by mouth 2 (two) times daily.     metoCLOPramide 10 MG tablet  Commonly known as:  REGLAN  Take 0.5 tablets (5 mg total) by mouth 4 (four) times daily -  before meals and at bedtime. To prevent vomiting     multivitamin with minerals Tabs tablet  Take 1 tablet by mouth daily.     nitroGLYCERIN 0.4 MG SL tablet  Commonly known as:  NITROSTAT  Place 1 tablet (0.4 mg total) under the tongue every 5 (five) minutes x 3 doses as needed for chest pain.     potassium chloride SA 20 MEQ tablet  Commonly known as:  K-DUR,KLOR-CON  Take 1 tablet (20 mEq total) by mouth daily.     rosuvastatin 10 MG tablet  Commonly known as:  CRESTOR  Take 1 tablet (10 mg total) by mouth daily.     traMADol 50 MG tablet  Commonly known as:  ULTRAM  Take 1 tablet (50 mg total) by mouth every 8 (eight) hours as needed for pain.     traZODone 150 MG tablet  Commonly known as:  DESYREL  Take 1 tablet (150 mg total) by mouth at bedtime.       Allergies  Allergen Reactions  . Daptomycin Rash  . Tape Rash and Other (See Comments)    Adhesive Tape-Burn skin.    The results of significant diagnostics from this hospitalization (including imaging, microbiology, ancillary and laboratory) are listed below for reference.    Significant Diagnostic Studies: Dg Chest Portable 1 View  06/03/2013   CLINICAL DATA:  Cough and shortness of breath for 1 week. History of mitral valve repair and congestive heart failure.   EXAM: PORTABLE CHEST - 1 VIEW  COMPARISON:  03/10/2013  FINDINGS: Midline trachea. Cardiomegaly accentuated by AP portable technique. No definite pleural fluid. No pneumothorax. Remote right rib trauma. Mild to moderate interstitial edema. No lobar consolidation.  IMPRESSION: Mild to moderate congestive heart failure.   Electronically Signed   By: Jeronimo Greaves M.D.   On: 06/03/2013 17:17      Labs: Basic Metabolic Panel:  Recent Labs Lab 06/03/13 1656 06/04/13 0155 06/05/13 0150 06/06/13 0149 06/07/13 0223  NA 136  --  139 136 142  K 3.7  --  4.0 3.9 4.1  CL 99  --  100 94* 100  CO2 29  --  32 33* 35*  GLUCOSE 210*  --  147* 234* 157*  BUN 10  --  19 18 22   CREATININE 0.78  --  0.96 0.85 0.91  CALCIUM 9.6  --  9.0 9.5 9.4  MG  --  1.8  --   --  1.9   Liver Function Tests:  Recent Labs Lab 06/03/13 1656  AST 18  ALT 17  ALKPHOS 96  BILITOT 0.6  PROT 8.0  ALBUMIN 4.1    Recent Labs Lab 06/03/13 1656  LIPASE 15   CBC:  Recent Labs Lab 06/03/13 1656 06/05/13 0150 06/07/13 0223  WBC 5.4 7.5 5.7  NEUTROABS 4.7  --   --   HGB 14.8 13.1 13.8  HCT 45.6 40.9 42.5  MCV 91.8 91.9 92.2  PLT 99* 112* 119*   Cardiac Enzymes:  Recent Labs Lab 06/06/13 0819 06/06/13 1306 06/06/13 2003 06/07/13 0222 06/07/13 0741  TROPONINI <0.30 <0.30 <0.30 <0.30 <0.30    Recent Labs  02/18/13 0518 03/08/13 2316 06/03/13 1740  PROBNP 1873.0* 1304.0* 1294.0*   CBG:  Recent Labs Lab 06/07/13 1131 06/07/13 1722 06/07/13 2054 06/08/13 0752 06/08/13 1145  GLUCAP 178* 166* 202* 137* 167*    Principal Problem:   Acute on chronic systolic heart failure Active Problems:   Morbid obesity   OSA (obstructive sleep apnea)   Diabetic gastroparesis   Type II or unspecified type diabetes mellitus with renal manifestations, uncontrolled(250.42)   Status post mitral valve replacement   Acute respiratory failure with hypoxia   Acute systolic CHF (congestive heart  failure)   Time coordinating discharge: 25 minutes  Signed:  Brendia Sacks, MD [facility] 06/08/2013, 1:29 PM

## 2013-06-18 ENCOUNTER — Ambulatory Visit: Payer: Self-pay | Admitting: Internal Medicine

## 2013-06-22 ENCOUNTER — Encounter: Payer: Self-pay | Admitting: Internal Medicine

## 2013-06-22 ENCOUNTER — Ambulatory Visit (INDEPENDENT_AMBULATORY_CARE_PROVIDER_SITE_OTHER)
Admission: RE | Admit: 2013-06-22 | Discharge: 2013-06-22 | Disposition: A | Discharge status: Home / Self Care | Payer: BC Managed Care – PPO | Source: Ambulatory Visit | Attending: Internal Medicine | Admitting: Internal Medicine

## 2013-06-22 ENCOUNTER — Ambulatory Visit (INDEPENDENT_AMBULATORY_CARE_PROVIDER_SITE_OTHER): Payer: BC Managed Care – PPO | Admitting: Internal Medicine

## 2013-06-22 VITALS — BP 158/74 | HR 60 | Temp 98.6°F | Resp 16 | Ht 69.0 in | Wt 252.0 lb

## 2013-06-22 DIAGNOSIS — I10 Essential (primary) hypertension: Secondary | ICD-10-CM

## 2013-06-22 DIAGNOSIS — M25519 Pain in unspecified shoulder: Secondary | ICD-10-CM

## 2013-06-22 DIAGNOSIS — Z2911 Encounter for prophylactic immunotherapy for respiratory syncytial virus (RSV): Secondary | ICD-10-CM

## 2013-06-22 DIAGNOSIS — E1129 Type 2 diabetes mellitus with other diabetic kidney complication: Secondary | ICD-10-CM

## 2013-06-22 DIAGNOSIS — M25512 Pain in left shoulder: Secondary | ICD-10-CM

## 2013-06-22 DIAGNOSIS — I5022 Chronic systolic (congestive) heart failure: Secondary | ICD-10-CM

## 2013-06-22 DIAGNOSIS — Z23 Encounter for immunization: Secondary | ICD-10-CM

## 2013-06-22 DIAGNOSIS — I509 Heart failure, unspecified: Secondary | ICD-10-CM

## 2013-06-22 MED ORDER — AZILSARTAN MEDOXOMIL 40 MG PO TABS: 1.0000 | ORAL_TABLET | Freq: Every day | ORAL | Status: DC

## 2013-06-22 NOTE — Assessment & Plan Note (Signed)
He was referred for an eye exam He will work on his lifestyle modifications He does not need a medication at this time

## 2013-06-22 NOTE — Assessment & Plan Note (Signed)
His BP is not adequately controlled so I have added an ARB

## 2013-06-22 NOTE — Progress Notes (Signed)
Pre visit review using our clinic review tool, if applicable. No additional management support is needed unless otherwise documented below in the visit note. 

## 2013-06-22 NOTE — Progress Notes (Signed)
Subjective:    Patient ID: Cody Hale, male    DOB: Oct 30, 1950, 62 y.o.   MRN: [id number]  Hypertension This is a chronic problem. The current episode started more than 1 year ago. The problem has been gradually worsening since onset. The problem is uncontrolled. Associated symptoms include anxiety, malaise/fatigue, peripheral edema and shortness of breath. Pertinent negatives include no blurred vision, chest pain, headaches, neck pain, orthopnea, palpitations, PND or sweats. Past treatments include beta blockers and diuretics. The current treatment provides moderate improvement. Compliance problems include diet and exercise.  Hypertensive end-organ damage includes heart failure. Identifiable causes of hypertension include sleep apnea.      Review of Systems  Constitutional: Positive for malaise/fatigue and fatigue. Negative for fever, chills, diaphoresis, activity change, appetite change and unexpected weight change.  HENT: Negative.   Eyes: Negative.  Negative for blurred vision.  Respiratory: Positive for apnea and shortness of breath. Negative for cough, choking, chest tightness, wheezing and stridor.   Cardiovascular: Negative.  Negative for chest pain, palpitations, orthopnea, leg swelling and PND.  Gastrointestinal: Negative.  Negative for nausea, vomiting, abdominal pain, diarrhea, constipation and blood in stool.  Endocrine: Negative.   Genitourinary: Negative.   Musculoskeletal: Positive for arthralgias. Negative for back pain, gait problem, joint swelling, myalgias, neck pain and neck stiffness.       Left shoulder pain for one month, no specific trauma or injury, he has pain with movement and has noticed a decrease in his ROM.  Skin: Negative.   Allergic/Immunologic: Negative.   Neurological: Negative for dizziness, tremors, facial asymmetry, weakness, light-headedness and headaches.  Hematological: Negative.  Negative for adenopathy. Does not bruise/bleed easily.    Psychiatric/Behavioral: Negative.        Objective:   Physical Exam  Vitals reviewed. Constitutional: He is oriented to person, place, and time. He appears well-developed and well-nourished.  Non-toxic appearance. He does not have a sickly appearance. He does not appear ill. No distress.  HENT:  Head: Normocephalic and atraumatic.  Mouth/Throat: Oropharynx is clear and moist. No oropharyngeal exudate.  Eyes: Conjunctivae are normal. Right eye exhibits no discharge. Left eye exhibits no discharge. No scleral icterus.  Neck: Normal range of motion. Neck supple. No JVD present. No tracheal deviation present. No thyromegaly present.  Cardiovascular: Normal rate, regular rhythm, normal heart sounds and intact distal pulses.  Exam reveals no gallop and no friction rub.   No murmur heard. Pulmonary/Chest: Effort normal and breath sounds normal. No stridor. No respiratory distress. He has no wheezes. He has no rales. He exhibits no tenderness.  Abdominal: Soft. Bowel sounds are normal. He exhibits no distension and no mass. There is no tenderness. There is no rebound and no guarding.  Musculoskeletal: He exhibits edema (trace pitting edema in BLE). He exhibits no tenderness.       Left shoulder: He exhibits decreased range of motion. He exhibits no tenderness, no bony tenderness, no swelling, no effusion, no crepitus, no deformity, no laceration, no pain, no spasm, normal pulse and normal strength.  Lymphadenopathy:    He has no cervical adenopathy.  Neurological: He is oriented to person, place, and time.  Skin: Skin is warm and dry. No rash noted. He is not diaphoretic. No erythema. No pallor.  Psychiatric: He has a normal mood and affect. His behavior is normal. Judgment and thought content normal.     Lab Results  Component Value Date   WBC 5.7 06/07/2013   HGB 13.8 06/07/2013   HCT  42.5 06/07/2013   PLT 119* 06/07/2013   GLUCOSE 157* 06/07/2013   CHOL 138 03/19/2013   TRIG 153.0*  03/19/2013   HDL 35.60* 03/19/2013   LDLCALC 72 03/19/2013   ALT 17 06/03/2013   AST 18 06/03/2013   NA 142 06/07/2013   K 4.1 06/07/2013   CL 100 06/07/2013   CREATININE 0.91 06/07/2013   BUN 22 06/07/2013   CO2 35* 06/07/2013   TSH 0.929 06/04/2013   INR 1.13 11/30/2012   HGBA1C 6.7* 06/04/2013       Assessment & Plan:

## 2013-06-22 NOTE — Assessment & Plan Note (Signed)
I will check a plain xray to see if there is DJD or spurring He will have to avoid nsaids, I may consider a steroid injection at a later date

## 2013-06-22 NOTE — Assessment & Plan Note (Signed)
His volume status appears normal today but his BP is not well controlled so I have added an ARB

## 2013-06-22 NOTE — Patient Instructions (Signed)
Type 2 Diabetes Mellitus, Adult Type 2 diabetes mellitus, often simply referred to as type 2 diabetes, is a long-lasting (chronic) disease. In type 2 diabetes, the pancreas does not make enough insulin (a hormone), the cells are less responsive to the insulin that is made (insulin resistance), or both. Normally, insulin moves sugars from food into the tissue cells. The tissue cells use the sugars for energy. The lack of insulin or the lack of normal response to insulin causes excess sugars to build up in the blood instead of going into the tissue cells. As a result, high blood sugar (hyperglycemia) develops. The effect of high sugar (glucose) levels can cause many complications. Type 2 diabetes was also previously called adult-onset diabetes but it can occur at any age.  RISK FACTORS  A person is predisposed to developing type 2 diabetes if someone in the family has the disease and also has one or more of the following primary risk factors:  Overweight.  An inactive lifestyle.  A history of consistently eating high-calorie foods. Maintaining a normal weight and regular physical activity can reduce the chance of developing type 2 diabetes. SYMPTOMS  A person with type 2 diabetes may not show symptoms initially. The symptoms of type 2 diabetes appear slowly. The symptoms include:  Increased thirst (polydipsia).  Increased urination (polyuria).  Increased urination during the night (nocturia).  Weight loss. This weight loss may be rapid.  Frequent, recurring infections.  Tiredness (fatigue).  Weakness.  Vision changes, such as blurred vision.  Fruity smell to your breath.  Abdominal pain.  Nausea or vomiting.  Cuts or bruises which are slow to heal.  Tingling or numbness in the hands or feet. DIAGNOSIS Type 2 diabetes is frequently not diagnosed until complications of diabetes are present. Type 2 diabetes is diagnosed when symptoms or complications are present and when blood  glucose levels are increased. Your blood glucose level may be checked by one or more of the following blood tests:  A fasting blood glucose test. You will not be allowed to eat for at least 8 hours before a blood sample is taken.  A random blood glucose test. Your blood glucose is checked at any time of the day regardless of when you ate.  A hemoglobin A1c blood glucose test. A hemoglobin A1c test provides information about blood glucose control over the previous 3 months.  An oral glucose tolerance test (OGTT). Your blood glucose is measured after you have not eaten (fasted) for 2 hours and then after you drink a glucose-containing beverage. TREATMENT   You may need to take insulin or diabetes medicine daily to keep blood glucose levels in the desired range.  You will need to match insulin dosing with exercise and healthy food choices. The treatment goal is to maintain the before meal blood sugar (preprandial glucose) level at 70 130 mg/dL. HOME CARE INSTRUCTIONS   Have your hemoglobin A1c level checked twice a year.  Perform daily blood glucose monitoring as directed by your caregiver.  Monitor urine ketones when you are ill and as directed by your caregiver.  Take your diabetes medicine or insulin as directed by your caregiver to maintain your blood glucose levels in the desired range.  Never run out of diabetes medicine or insulin. It is needed every day.  Adjust insulin based on your intake of carbohydrates. Carbohydrates can raise blood glucose levels but need to be included in your diet. Carbohydrates provide vitamins, minerals, and fiber which are an essential part of   a healthy diet. Carbohydrates are found in fruits, vegetables, whole grains, dairy products, legumes, and foods containing added sugars.    Eat healthy foods. Alternate 3 meals with 3 snacks.  Lose weight if overweight.  Carry a medical alert card or wear your medical alert jewelry.  Carry a 15 gram  carbohydrate snack with you at all times to treat low blood glucose (hypoglycemia). Some examples of 15 gram carbohydrate snacks include:  Glucose tablets, 3 or 4   Glucose gel, 15 gram tube  Raisins, 2 tablespoons (24 grams)  Jelly beans, 6  Animal crackers, 8  Regular pop, 4 ounces (120 mL)  Gummy treats, 9  Recognize hypoglycemia. Hypoglycemia occurs with blood glucose levels of 70 mg/dL and below. The risk for hypoglycemia increases when fasting or skipping meals, during or after intense exercise, and during sleep. Hypoglycemia symptoms can include:  Tremors or shakes.  Decreased ability to concentrate.  Sweating.  Increased heart rate.  Headache.  Dry mouth.  Hunger.  Irritability.  Anxiety.  Restless sleep.  Altered speech or coordination.  Confusion.  Treat hypoglycemia promptly. If you are alert and able to safely swallow, follow the 15:15 rule:  Take 15 20 grams of rapid-acting glucose or carbohydrate. Rapid-acting options include glucose gel, glucose tablets, or 4 ounces (120 mL) of fruit juice, regular soda, or low fat milk.  Check your blood glucose level 15 minutes after taking the glucose.  Take 15 20 grams more of glucose if the repeat blood glucose level is still 70 mg/dL or below.  Eat a meal or snack within 1 hour once blood glucose levels return to normal.    Be alert to polyuria and polydipsia which are early signs of hyperglycemia. An early awareness of hyperglycemia allows for prompt treatment. Treat hyperglycemia as directed by your caregiver.  Engage in at least 150 minutes of moderate-intensity physical activity a week, spread over at least 3 days of the week or as directed by your caregiver. In addition, you should engage in resistance exercise at least 2 times a week or as directed by your caregiver.  Adjust your medicine and food intake as needed if you start a new exercise or sport.  Follow your sick day plan at any time you  are unable to eat or drink as usual.  Avoid tobacco use.  Limit alcohol intake to no more than 1 drink per day for nonpregnant women and 2 drinks per day for men. You should drink alcohol only when you are also eating food. Talk with your caregiver whether alcohol is safe for you. Tell your caregiver if you drink alcohol several times a week.  Follow up with your caregiver regularly.  Schedule an eye exam soon after the diagnosis of type 2 diabetes and then annually.  Perform daily skin and foot care. Examine your skin and feet daily for cuts, bruises, redness, nail problems, bleeding, blisters, or sores. A foot exam by a caregiver should be done annually.  Brush your teeth and gums at least twice a day and floss at least once a day. Follow up with your dentist regularly.  Share your diabetes management plan with your workplace or school.  Stay up-to-date with immunizations.  Learn to manage stress.  Obtain ongoing diabetes education and support as needed.  Participate in, or seek rehabilitation as needed to maintain or improve independence and quality of life. Request a physical or occupational therapy referral if you are having foot or hand numbness or difficulties with grooming,   dressing, eating, or physical activity. SEEK MEDICAL CARE IF:   You are unable to eat food or drink fluids for more than 6 hours.  You have nausea and vomiting for more than 6 hours.  Your blood glucose level is over 240 mg/dL.  There is a change in mental status.  You develop an additional serious illness.  You have diarrhea for more than 6 hours.  You have been sick or have had a fever for a couple of days and are not getting better.  You have pain during any physical activity.  SEEK IMMEDIATE MEDICAL CARE IF:  You have difficulty breathing.  You have moderate to large ketone levels. MAKE SURE YOU:  Understand these instructions.  Will watch your condition.  Will get help right away if  you are not doing well or get worse. Document Released: 06/24/2005 Document Revised: 03/18/2012 Document Reviewed: 01/21/2012 [employer] Patient Information 2014 [employer].  

## 2013-06-28 ENCOUNTER — Ambulatory Visit (INDEPENDENT_AMBULATORY_CARE_PROVIDER_SITE_OTHER): Payer: BC Managed Care – PPO | Admitting: Cardiovascular Disease

## 2013-06-28 ENCOUNTER — Encounter: Payer: Self-pay | Admitting: Cardiovascular Disease

## 2013-06-28 VITALS — BP 105/50 | HR 57 | Ht 69.0 in | Wt 254.0 lb

## 2013-06-28 DIAGNOSIS — E785 Hyperlipidemia, unspecified: Secondary | ICD-10-CM

## 2013-06-28 DIAGNOSIS — Z952 Presence of prosthetic heart valve: Secondary | ICD-10-CM

## 2013-06-28 DIAGNOSIS — I10 Essential (primary) hypertension: Secondary | ICD-10-CM

## 2013-06-28 DIAGNOSIS — I5022 Chronic systolic (congestive) heart failure: Secondary | ICD-10-CM

## 2013-06-28 DIAGNOSIS — Z954 Presence of other heart-valve replacement: Secondary | ICD-10-CM

## 2013-06-28 NOTE — Patient Instructions (Addendum)
Your physician wants you to follow-up in:  4 months with Dr Lyda Perone will receive a reminder letter in the mail two months in advance. If you don't receive a letter, please call our office to schedule the follow-up appointment.  Your physician recommends that you continue on your current medications as directed. Please refer to the Current Medication list given to you today.
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
## 2013-07-15 ENCOUNTER — Encounter (INDEPENDENT_AMBULATORY_CARE_PROVIDER_SITE_OTHER): Payer: Self-pay | Admitting: Ophthalmology

## 2013-08-04 NOTE — Addendum Note (Signed)
Encounter addended by: Angelica Pou, RN on: 08/04/2013  7:47 AM<BR>     Documentation filed: Notes Section

## 2013-08-04 NOTE — Addendum Note (Signed)
Encounter addended by: Angelica Pou, RN on: 08/04/2013  3:50 PM<BR>     Documentation filed: Notes Section

## 2013-08-04 NOTE — Addendum Note (Signed)
Encounter addended by: Angelica Pou, RN on: 08/04/2013  3:51 PM<BR>     Documentation filed: Clinical Notes

## 2013-08-04 NOTE — Progress Notes (Signed)
Cardiac Rehabilitation Program Outcomes Report   Orientation:  11/24/2012 Graduate Date:  05/14/2013 Discharge Date:  05/14/2013 # of sessions completed: 36 DX: Mitral Valve replaced  Cardiologist: Hochrein Family MD:  McGowen Class Time:  09:30  A.  Exercise Program:  Tolerates exercise @ 3.90 METS for 15 minutes, Walk Test Results:  Post: Post Walk Test: Rest HR 67, BP160/70, O2100%,RPE 9 and RPD 9, 6 min HR 112, BP 180.60, O2 100, RPE 100%, RPE 11 and RPD 12, Post HR 65, BP 130/80 O2 99, RPE9, RPE9 Walked 1246feet at 2.27 mph at 2.7 METS, and Discharged to home exercise program.  Anticipated compliance:  excellent  B.  Mental Health:  Good mental attitude  C.  Education/Instruction/Skills  Accurately checks own pulse.  Rest:  67  Exercise: 105, Knows THR for exercise, Uses Perceived Exertion Scale and/or Dyspnea Scale and Attended 13 education classes  Uses Perceived Exertion Scale and/or Dyspnea Scale  D.  Nutrition/Weight Control/Body Composition:  Adherence to prescribed nutrition program: good    E.  Blood Lipids    Lab Results  Component Value Date   CHOL 138 03/19/2013   HDL 35.60* 03/19/2013   LDLCALC 72 03/19/2013   TRIG 153.0* 03/19/2013   CHOLHDL 4 03/19/2013    F.  Lifestyle Changes:  Making positive lifestyle changes and Not smoking:  Quit 1994  G.  Symptoms noted with exercise:  Asymptomatic  Report Completed By:  Lelon Huh. Elleah Hemsley RN   Comments:  This is patients graduation report. He has done well while in rehab. HE achieved a peak METS of 3.90. His resting HR was 67 and BP was 160/70 and his peak HR was 105 and peak BP was 180/60. A follow up call will be made at 1 month, 6 months and 1year to ensure exercise compliance.

## 2013-08-04 NOTE — Addendum Note (Signed)
Encounter addended by: Angelica Pou, RN on: 08/04/2013  7:35 AM<BR>     Documentation filed: Notes Section

## 2013-08-04 NOTE — Addendum Note (Signed)
Encounter addended by: Angelica Pou, RN on: 08/04/2013  7:37 AM<BR>     Documentation filed: Clinical Notes

## 2013-08-04 NOTE — Progress Notes (Signed)
Cardiac Rehabilitation Program Outcomes Report   Orientation:  11/24/2012 1st week Report: 12/16/2012 Graduate Date:  tbd Discharge Date:  tbd # of sessions completed: 3 DX: Mitral Valve replaced  Cardiologist: Hochrein Family MD:  McGowen Class Time:  09:30  A.  Exercise Program:  Tolerates exercise @ 3.73 METS for 15 minutes and Walk Test Results:  Pre: Pre walk Test: rest HR 65, BP 138/60, O2 97%, RPE 7 and RPD 7, 6 minHR 101, BP 170/62, O2 93%, RPE 15 and RPD 15, Post HR 77, BP 152/60, O2 100%, RPE 7 and RPD 7. Walked 650ft at 1.1 mph for 1.87 METS.  B.  Mental Health:  Good mental attitude  C.  Education/Instruction/Skills  Accurately checks own pulse.  Rest:  53  Exercise: 110, Knows THR for exercise and Uses Perceived Exertion Scale and/or Dyspnea Scale  Uses Perceived Exertion Scale and/or Dyspnea Scale  D.  Nutrition/Weight Control/Body Composition:  Adherence to prescribed nutrition program: good    E.  Blood Lipids    Lab Results  Component Value Date   CHOL 138 03/19/2013   HDL 35.60* 03/19/2013   LDLCALC 72 03/19/2013   TRIG 153.0* 03/19/2013   CHOLHDL 4 03/19/2013    F.  Lifestyle Changes:  Making positive lifestyle changes and Not smoking:  Quit 1994  G.  Symptoms noted with exercise:  Asymptomatic  Report Completed By:  Lelon Huh. Parthiv Mucci RN   Comments:  This is patients 1 st week report. He has done well his first week. He achieved a peak METS of 3.73. His resting HR was 53 and his resting BP was 118/50, His peak HR was 110 and peak BP was 130/80. A halfway report will follow upon his 18 th visit

## 2013-08-04 NOTE — Addendum Note (Signed)
Encounter addended by: Angelica Pou, RN on: 08/04/2013  7:48 AM<BR>     Documentation filed: Clinical Notes

## 2013-08-04 NOTE — Progress Notes (Signed)
Cardiac Rehabilitation Program Outcomes Report   Orientation:  11/24/2012 Halfway Report: 03/17/2013 Graduate Date:  tbd Discharge Date:  tbd # of sessions completed: 18 DX: Mitral valve replaced  Cardiologist: Hochrein Family MD:  McGowen Class Time:  09:30  A.  Exercise Program:  Tolerates exercise @ 3.89 METS for 15 minutes  B.  Mental Health:  Good mental attitude  C.  Education/Instruction/Skills  Accurately checks own pulse.  Rest:  72  Exercise:  102, Knows THR for exercise and Uses Perceived Exertion Scale and/or Dyspnea Scale  Uses Perceived Exertion Scale and/or Dyspnea Scale  D.  Nutrition/Weight Control/Body Composition:  Adherence to prescribed nutrition program: good    E.  Blood Lipids    Lab Results  Component Value Date   CHOL 138 03/19/2013   HDL 35.60* 03/19/2013   LDLCALC 72 03/19/2013   TRIG 153.0* 03/19/2013   CHOLHDL 4 03/19/2013    F.  Lifestyle Changes:  Making positive lifestyle changes and Not smoking:  Quit 1994  G.  Symptoms noted with exercise:  Asymptomatic  Report Completed By:  Lelon Huh. Tovah Slavick RN   Comments:  This is patients halfway report . He achieved a peak METS of 3.89. His resting HR was 72 and resting BP was 128/50 and his peak HR was 102 and peak BP was 142/60. A graduation report will be sent upon his 36th visit.

## 2013-08-06 ENCOUNTER — Telehealth: Payer: Self-pay | Admitting: Internal Medicine

## 2013-08-06 DIAGNOSIS — E1165 Type 2 diabetes mellitus with hyperglycemia: Secondary | ICD-10-CM

## 2013-08-06 DIAGNOSIS — I5022 Chronic systolic (congestive) heart failure: Secondary | ICD-10-CM

## 2013-08-06 DIAGNOSIS — E1129 Type 2 diabetes mellitus with other diabetic kidney complication: Secondary | ICD-10-CM

## 2013-08-06 DIAGNOSIS — I10 Essential (primary) hypertension: Secondary | ICD-10-CM

## 2013-08-06 MED ORDER — AZILSARTAN MEDOXOMIL 40 MG PO TABS: 1.0000 | ORAL_TABLET | Freq: Every day | ORAL | Status: DC

## 2013-08-06 NOTE — Telephone Encounter (Signed)
Patient is requesting a rx for edarbi 40 mg to be called in to [facility] in [place].

## 2013-08-06 NOTE — Telephone Encounter (Signed)
No samples available at this time, pt notified. 

## 2013-08-06 NOTE — Telephone Encounter (Signed)
08/06/2013  Pt is in office today waiting on wife who is seeing Dr. Katrinka Blazing, and wants to know if he can get samples of Edarbi (azilsartan medoxomil) 40mg .

## 2013-08-09 ENCOUNTER — Telehealth: Payer: Self-pay

## 2013-08-09 NOTE — Telephone Encounter (Signed)
The patient's wife called and is hoping to get a prior auth and rx refills for the pt's blood pressure medication   Callback - [phone]

## 2013-08-10 ENCOUNTER — Telehealth: Payer: Self-pay | Admitting: Internal Medicine

## 2013-08-10 NOTE — Telephone Encounter (Signed)
LMOVM to call back with details or have pharmacy to sent request.

## 2013-08-10 NOTE — Telephone Encounter (Signed)
Pt's wife called back on refills.  He needs a refill on Carvedilol 3.125 mg.   He needs a PA on Edarbi 40 mg.

## 2013-08-11 MED ORDER — CARVEDILOL 3.125 MG PO TABS: 3.1250 mg | ORAL_TABLET | Freq: Two times a day (BID) | ORAL | Status: DC

## 2013-08-12 ENCOUNTER — Telehealth: Payer: Self-pay | Admitting: Internal Medicine

## 2013-08-12 DIAGNOSIS — I10 Essential (primary) hypertension: Secondary | ICD-10-CM

## 2013-08-12 DIAGNOSIS — E1129 Type 2 diabetes mellitus with other diabetic kidney complication: Secondary | ICD-10-CM

## 2013-08-12 DIAGNOSIS — E1165 Type 2 diabetes mellitus with hyperglycemia: Secondary | ICD-10-CM

## 2013-08-12 MED ORDER — LOSARTAN POTASSIUM 100 MG PO TABS: 100.0000 mg | ORAL_TABLET | Freq: Every day | ORAL | Status: DC

## 2013-08-12 NOTE — Telephone Encounter (Signed)
Cody Hale approved per [employer]

## 2013-08-12 NOTE — Telephone Encounter (Signed)
Patient's wife called and says that the Azilsartan Medoxomil (EDARBI) 40 MG Rx with their insurance is going to cost them $147.26 with insurance. She wants to know if something else can be prescribed due to this not being affordable for them. Please advise.

## 2013-08-20 ENCOUNTER — Emergency Department (HOSPITAL_COMMUNITY)
Admission: EM | Admit: 2013-08-20 | Discharge: 2013-08-20 | Disposition: A | Discharge status: Home / Self Care | Payer: BC Managed Care – PPO | Attending: Emergency Medicine | Admitting: Emergency Medicine

## 2013-08-20 ENCOUNTER — Encounter (HOSPITAL_COMMUNITY): Payer: Self-pay | Admitting: Emergency Medicine

## 2013-08-20 ENCOUNTER — Emergency Department (HOSPITAL_COMMUNITY): Payer: BC Managed Care – PPO

## 2013-08-20 DIAGNOSIS — E1149 Type 2 diabetes mellitus with other diabetic neurological complication: Secondary | ICD-10-CM | POA: Insufficient documentation

## 2013-08-20 DIAGNOSIS — J209 Acute bronchitis, unspecified: Secondary | ICD-10-CM | POA: Insufficient documentation

## 2013-08-20 DIAGNOSIS — F411 Generalized anxiety disorder: Secondary | ICD-10-CM | POA: Insufficient documentation

## 2013-08-20 DIAGNOSIS — Z872 Personal history of diseases of the skin and subcutaneous tissue: Secondary | ICD-10-CM | POA: Insufficient documentation

## 2013-08-20 DIAGNOSIS — Z954 Presence of other heart-valve replacement: Secondary | ICD-10-CM | POA: Insufficient documentation

## 2013-08-20 DIAGNOSIS — J329 Chronic sinusitis, unspecified: Secondary | ICD-10-CM

## 2013-08-20 DIAGNOSIS — I471 Supraventricular tachycardia, unspecified: Secondary | ICD-10-CM | POA: Insufficient documentation

## 2013-08-20 DIAGNOSIS — Z79899 Other long term (current) drug therapy: Secondary | ICD-10-CM | POA: Insufficient documentation

## 2013-08-20 DIAGNOSIS — K3184 Gastroparesis: Secondary | ICD-10-CM | POA: Insufficient documentation

## 2013-08-20 DIAGNOSIS — F332 Major depressive disorder, recurrent severe without psychotic features: Secondary | ICD-10-CM | POA: Insufficient documentation

## 2013-08-20 DIAGNOSIS — Z87442 Personal history of urinary calculi: Secondary | ICD-10-CM | POA: Insufficient documentation

## 2013-08-20 DIAGNOSIS — Z87891 Personal history of nicotine dependence: Secondary | ICD-10-CM | POA: Insufficient documentation

## 2013-08-20 DIAGNOSIS — I5022 Chronic systolic (congestive) heart failure: Secondary | ICD-10-CM | POA: Insufficient documentation

## 2013-08-20 DIAGNOSIS — I2510 Atherosclerotic heart disease of native coronary artery without angina pectoris: Secondary | ICD-10-CM | POA: Insufficient documentation

## 2013-08-20 DIAGNOSIS — M1990 Unspecified osteoarthritis, unspecified site: Secondary | ICD-10-CM | POA: Insufficient documentation

## 2013-08-20 DIAGNOSIS — J40 Bronchitis, not specified as acute or chronic: Secondary | ICD-10-CM

## 2013-08-20 DIAGNOSIS — I10 Essential (primary) hypertension: Secondary | ICD-10-CM | POA: Insufficient documentation

## 2013-08-20 DIAGNOSIS — Z7982 Long term (current) use of aspirin: Secondary | ICD-10-CM | POA: Insufficient documentation

## 2013-08-20 DIAGNOSIS — Z87440 Personal history of urinary (tract) infections: Secondary | ICD-10-CM | POA: Insufficient documentation

## 2013-08-20 DIAGNOSIS — R000 Tachycardia, unspecified: Secondary | ICD-10-CM | POA: Insufficient documentation

## 2013-08-20 LAB — GLUCOSE, CAPILLARY: Glucose-Capillary: 197 mg/dL — ABNORMAL HIGH (ref 70–99)

## 2013-08-20 MED ORDER — CIPROFLOXACIN HCL 250 MG PO TABS
500.0000 mg | ORAL_TABLET | Freq: Once | ORAL | Status: AC
  Administered 2013-08-20: 500 mg via ORAL
  Filled 2013-08-20: qty 2

## 2013-08-20 MED ORDER — CIPROFLOXACIN HCL 500 MG PO TABS: 500.0000 mg | ORAL_TABLET | Freq: Two times a day (BID) | ORAL | Status: DC

## 2013-08-20 MED ORDER — CIPROFLOXACIN HCL 250 MG PO TABS
ORAL_TABLET | ORAL | Status: AC
  Administered 2013-08-20: 250 mg
  Filled 2013-08-20: qty 1

## 2013-08-20 MED ORDER — HYDROCOD POLST-CHLORPHEN POLST 10-8 MG/5ML PO LQCR: 5.0000 mL | Freq: Two times a day (BID) | ORAL | Status: DC

## 2013-08-20 MED ORDER — HYDROCOD POLST-CHLORPHEN POLST 10-8 MG/5ML PO LQCR
5.0000 mL | Freq: Once | ORAL | Status: AC
  Administered 2013-08-20: 5 mL via ORAL
  Filled 2013-08-20: qty 5

## 2013-08-20 MED ORDER — ACETAMINOPHEN 325 MG PO TABS
650.0000 mg | ORAL_TABLET | Freq: Once | ORAL | Status: AC
  Administered 2013-08-20: 650 mg via ORAL
  Filled 2013-08-20: qty 2
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
## 2013-08-20 NOTE — ED Notes (Signed)
Alert, NAD, sick for 3-4 days, started with sore throat, and runny nose.  Now has cough productive yellow sputum. No fever.

## 2013-08-20 NOTE — ED Notes (Signed)
Patient c/o cough with congestion and sore throat. Denies any fevers. Per patient productive cough with thick yellow sputum. Patient reports "soreness in chest with cough.

## 2013-08-20 NOTE — Discharge Instructions (Signed)
Bronchitis Bronchitis is swelling (inflammation) of the air tubes leading to your lungs (bronchi). This causes mucus and a cough. If the swelling gets bad, you may have trouble breathing. HOME CARE   Rest.  Drink enough fluids to keep your pee (urine) clear or pale yellow (unless you have a condition where you have to watch how much you drink).  Only take medicine as told by your doctor. If you were given antibiotic medicines, finish them even if you start to feel better.  Avoid smoke, irritating chemicals, and strong smells. These make the problem worse. Quit smoking if you smoke. This helps your lungs heal faster.  Use a cool mist humidifier. Change the water in the humidifier every day. You can also sit in the bathroom with hot shower running for 5 10 minutes. Keep the door closed.  See your health care provider as told.  Wash your hands often. GET HELP IF: Your problems do not get better after 1 week. GET HELP RIGHT AWAY IF:   Your fever gets worse.  You have chills.  Your chest hurts.  Your problems breathing get worse.  You have blood in your mucus.  You pass out (faint).  You feel lightheaded.  You have a bad headache.  You throw up (vomit) again and again. MAKE SURE YOU:  Understand these instructions.  Will watch your condition.  Will get help right away if you are not doing well or get worse. Document Released: 12/11/2007 Document Revised: 04/14/2013 Document Reviewed: 02/16/2013 ExitCare Patient Information 2014 [employer].   Sinusitis Sinusitis is redness, soreness, and swelling (inflammation) of the paranasal sinuses. Paranasal sinuses are air pockets within the bones of your face (beneath the eyes, the middle of the forehead, or above the eyes). In healthy paranasal sinuses, mucus is able to drain out, and air is able to circulate through them by way of your nose. However, when your paranasal sinuses are inflamed, mucus and air can become trapped.  This can allow bacteria and other germs to grow and cause infection. Sinusitis can develop quickly and last only a short time (acute) or continue over a long period (chronic). Sinusitis that lasts for more than 12 weeks is considered chronic.  CAUSES  Causes of sinusitis include:  Allergies.  Structural abnormalities, such as displacement of the cartilage that separates your nostrils (deviated septum), which can decrease the air flow through your nose and sinuses and affect sinus drainage.  Functional abnormalities, such as when the small hairs (cilia) that line your sinuses and help remove mucus do not work properly or are not present. SYMPTOMS  Symptoms of acute and chronic sinusitis are the same. The primary symptoms are pain and pressure around the affected sinuses. Other symptoms include:  Upper toothache.  Earache.  Headache.  Bad breath.  Decreased sense of smell and taste.  A cough, which worsens when you are lying flat.  Fatigue.  Fever.  Thick drainage from your nose, which often is green and may contain pus (purulent).  Swelling and warmth over the affected sinuses. DIAGNOSIS  Your caregiver will perform a physical exam. During the exam, your caregiver may:  Look in your nose for signs of abnormal growths in your nostrils (nasal polyps).  Tap over the affected sinus to check for signs of infection.  View the inside of your sinuses (endoscopy) with a special imaging device with a light attached (endoscope), which is inserted into your sinuses. If your caregiver suspects that you have chronic sinusitis, one   or more of the following tests may be recommended:  Allergy tests.  Nasal culture A sample of mucus is taken from your nose and sent to a lab and screened for bacteria.  Nasal cytology A sample of mucus is taken from your nose and examined by your caregiver to determine if your sinusitis is related to an allergy. TREATMENT  Most cases of acute sinusitis are  related to a viral infection and will resolve on their own within 10 days. Sometimes medicines are prescribed to help relieve symptoms (pain medicine, decongestants, nasal steroid sprays, or saline sprays).  However, for sinusitis related to a bacterial infection, your caregiver will prescribe antibiotic medicines. These are medicines that will help kill the bacteria causing the infection.  Rarely, sinusitis is caused by a fungal infection. In theses cases, your caregiver will prescribe antifungal medicine. For some cases of chronic sinusitis, surgery is needed. Generally, these are cases in which sinusitis recurs more than 3 times per year, despite other treatments. HOME CARE INSTRUCTIONS   Drink plenty of water. Water helps thin the mucus so your sinuses can drain more easily.  Use a humidifier.  Inhale steam 3 to 4 times a day (for example, sit in the bathroom with the shower running).  Apply a warm, moist washcloth to your face 3 to 4 times a day, or as directed by your caregiver.  Use saline nasal sprays to help moisten and clean your sinuses.  Take over-the-counter or prescription medicines for pain, discomfort, or fever only as directed by your caregiver. SEEK IMMEDIATE MEDICAL CARE IF:  You have increasing pain or severe headaches.  You have nausea, vomiting, or drowsiness.  You have swelling around your face.  You have vision problems.  You have a stiff neck.  You have difficulty breathing. MAKE SURE YOU:   Understand these instructions.  Will watch your condition.  Will get help right away if you are not doing well or get worse. Document Released: 06/24/2005 Document Revised: 09/16/2011 Document Reviewed: 07/09/2011 Exit[employer] Patient Information 2014 [employer].  

## 2013-08-23 NOTE — ED Provider Notes (Signed)
Medical screening examination/treatment/procedure(s) were performed by non-physician practitioner and as supervising physician I was immediately available for consultation/collaboration.  EKG Interpretation   None        Mescal Flinchbaugh, MD 08/23/13 1425 

## 2013-09-14 ENCOUNTER — Other Ambulatory Visit: Payer: Self-pay

## 2013-09-14 MED ORDER — FUROSEMIDE 40 MG PO TABS: 40.0000 mg | ORAL_TABLET | Freq: Two times a day (BID) | ORAL | Status: DC

## 2013-09-14 NOTE — Telephone Encounter (Signed)
Received refill request from [facility]  request refills for lasix 40 mg . Rx last written 06/08/2013 qty 60 0 rf and pt last seen 06/22/2013.

## 2013-09-21 ENCOUNTER — Other Ambulatory Visit: Payer: Self-pay | Admitting: Internal Medicine

## 2013-09-21 ENCOUNTER — Encounter: Payer: Self-pay | Admitting: Internal Medicine

## 2013-09-21 ENCOUNTER — Ambulatory Visit (INDEPENDENT_AMBULATORY_CARE_PROVIDER_SITE_OTHER): Payer: BC Managed Care – PPO | Admitting: Internal Medicine

## 2013-09-21 ENCOUNTER — Other Ambulatory Visit (INDEPENDENT_AMBULATORY_CARE_PROVIDER_SITE_OTHER): Payer: BC Managed Care – PPO

## 2013-09-21 VITALS — BP 134/80 | HR 97 | Temp 98.0°F | Resp 16 | Ht 69.0 in | Wt 263.0 lb

## 2013-09-21 DIAGNOSIS — M1610 Unilateral primary osteoarthritis, unspecified hip: Secondary | ICD-10-CM

## 2013-09-21 DIAGNOSIS — I509 Heart failure, unspecified: Secondary | ICD-10-CM

## 2013-09-21 DIAGNOSIS — E1165 Type 2 diabetes mellitus with hyperglycemia: Principal | ICD-10-CM

## 2013-09-21 DIAGNOSIS — I10 Essential (primary) hypertension: Secondary | ICD-10-CM

## 2013-09-21 DIAGNOSIS — F331 Major depressive disorder, recurrent, moderate: Secondary | ICD-10-CM

## 2013-09-21 DIAGNOSIS — E1129 Type 2 diabetes mellitus with other diabetic kidney complication: Secondary | ICD-10-CM

## 2013-09-21 DIAGNOSIS — I5022 Chronic systolic (congestive) heart failure: Secondary | ICD-10-CM

## 2013-09-21 DIAGNOSIS — M169 Osteoarthritis of hip, unspecified: Secondary | ICD-10-CM

## 2013-09-21 LAB — BASIC METABOLIC PANEL
BUN: 18 mg/dL (ref 6–23)
CO2: 32 mEq/L (ref 19–32)
Calcium: 9.3 mg/dL (ref 8.4–10.5)
Chloride: 102 mEq/L (ref 96–112)
Creatinine, Ser: 0.9 mg/dL (ref 0.4–1.5)
GFR: 96.79 mL/min (ref >60.00)
Glucose, Bld: 156 mg/dL — ABNORMAL HIGH (ref 70–99)
Potassium: 4.2 mEq/L (ref 3.5–5.1)
Sodium: 141 mEq/L (ref 135–145)

## 2013-09-21 LAB — HEMOGLOBIN A1C: Hgb A1c MFr Bld: 7.3 % — ABNORMAL HIGH (ref 4.6–6.5)

## 2013-09-21 MED ORDER — SAXAGLIPTIN-METFORMIN ER 5-500 MG PO TB24: 1.0000 | ORAL_TABLET | Freq: Every day | ORAL | Status: DC

## 2013-09-21 MED ORDER — CLONAZEPAM 0.5 MG PO TABS: 0.5000 mg | ORAL_TABLET | Freq: Two times a day (BID) | ORAL | Status: DC | PRN

## 2013-09-21 NOTE — Progress Notes (Signed)
Pre visit review using our clinic review tool, if applicable. No additional management support is needed unless otherwise documented below in the visit note. 

## 2013-09-21 NOTE — Patient Instructions (Signed)
Type 2 Diabetes Mellitus, Adult Type 2 diabetes mellitus, often simply referred to as type 2 diabetes, is a long-lasting (chronic) disease. In type 2 diabetes, the pancreas does not make enough insulin (a hormone), the cells are less responsive to the insulin that is made (insulin resistance), or both. Normally, insulin moves sugars from food into the tissue cells. The tissue cells use the sugars for energy. The lack of insulin or the lack of normal response to insulin causes excess sugars to build up in the blood instead of going into the tissue cells. As a result, high blood sugar (hyperglycemia) develops. The effect of high sugar (glucose) levels can cause many complications. Type 2 diabetes was also previously called adult-onset diabetes but it can occur at any age.  RISK FACTORS  A person is predisposed to developing type 2 diabetes if someone in the family has the disease and also has one or more of the following primary risk factors:  Overweight.  An inactive lifestyle.  A history of consistently eating high-calorie foods. Maintaining a normal weight and regular physical activity can reduce the chance of developing type 2 diabetes. SYMPTOMS  A person with type 2 diabetes may not show symptoms initially. The symptoms of type 2 diabetes appear slowly. The symptoms include:  Increased thirst (polydipsia).  Increased urination (polyuria).  Increased urination during the night (nocturia).  Weight loss. This weight loss may be rapid.  Frequent, recurring infections.  Tiredness (fatigue).  Weakness.  Vision changes, such as blurred vision.  Fruity smell to your breath.  Abdominal pain.  Nausea or vomiting.  Cuts or bruises which are slow to heal.  Tingling or numbness in the hands or feet. DIAGNOSIS Type 2 diabetes is frequently not diagnosed until complications of diabetes are present. Type 2 diabetes is diagnosed when symptoms or complications are present and when blood  glucose levels are increased. Your blood glucose level may be checked by one or more of the following blood tests:  A fasting blood glucose test. You will not be allowed to eat for at least 8 hours before a blood sample is taken.  A random blood glucose test. Your blood glucose is checked at any time of the day regardless of when you ate.  A hemoglobin A1c blood glucose test. A hemoglobin A1c test provides information about blood glucose control over the previous 3 months.  An oral glucose tolerance test (OGTT). Your blood glucose is measured after you have not eaten (fasted) for 2 hours and then after you drink a glucose-containing beverage. TREATMENT   You may need to take insulin or diabetes medicine daily to keep blood glucose levels in the desired range.  You will need to match insulin dosing with exercise and healthy food choices. The treatment goal is to maintain the before meal blood sugar (preprandial glucose) level at 70 130 mg/dL. HOME CARE INSTRUCTIONS   Have your hemoglobin A1c level checked twice a year.  Perform daily blood glucose monitoring as directed by your caregiver.  Monitor urine ketones when you are ill and as directed by your caregiver.  Take your diabetes medicine or insulin as directed by your caregiver to maintain your blood glucose levels in the desired range.  Never run out of diabetes medicine or insulin. It is needed every day.  Adjust insulin based on your intake of carbohydrates. Carbohydrates can raise blood glucose levels but need to be included in your diet. Carbohydrates provide vitamins, minerals, and fiber which are an essential part of   a healthy diet. Carbohydrates are found in fruits, vegetables, whole grains, dairy products, legumes, and foods containing added sugars.    Eat healthy foods. Alternate 3 meals with 3 snacks.  Lose weight if overweight.  Carry a medical alert card or wear your medical alert jewelry.  Carry a 15 gram  carbohydrate snack with you at all times to treat low blood glucose (hypoglycemia). Some examples of 15 gram carbohydrate snacks include:  Glucose tablets, 3 or 4   Glucose gel, 15 gram tube  Raisins, 2 tablespoons (24 grams)  Jelly beans, 6  Animal crackers, 8  Regular pop, 4 ounces (120 mL)  Gummy treats, 9  Recognize hypoglycemia. Hypoglycemia occurs with blood glucose levels of 70 mg/dL and below. The risk for hypoglycemia increases when fasting or skipping meals, during or after intense exercise, and during sleep. Hypoglycemia symptoms can include:  Tremors or shakes.  Decreased ability to concentrate.  Sweating.  Increased heart rate.  Headache.  Dry mouth.  Hunger.  Irritability.  Anxiety.  Restless sleep.  Altered speech or coordination.  Confusion.  Treat hypoglycemia promptly. If you are alert and able to safely swallow, follow the 15:15 rule:  Take 15 20 grams of rapid-acting glucose or carbohydrate. Rapid-acting options include glucose gel, glucose tablets, or 4 ounces (120 mL) of fruit juice, regular soda, or low fat milk.  Check your blood glucose level 15 minutes after taking the glucose.  Take 15 20 grams more of glucose if the repeat blood glucose level is still 70 mg/dL or below.  Eat a meal or snack within 1 hour once blood glucose levels return to normal.    Be alert to polyuria and polydipsia which are early signs of hyperglycemia. An early awareness of hyperglycemia allows for prompt treatment. Treat hyperglycemia as directed by your caregiver.  Engage in at least 150 minutes of moderate-intensity physical activity a week, spread over at least 3 days of the week or as directed by your caregiver. In addition, you should engage in resistance exercise at least 2 times a week or as directed by your caregiver.  Adjust your medicine and food intake as needed if you start a new exercise or sport.  Follow your sick day plan at any time you  are unable to eat or drink as usual.  Avoid tobacco use.  Limit alcohol intake to no more than 1 drink per day for nonpregnant women and 2 drinks per day for men. You should drink alcohol only when you are also eating food. Talk with your caregiver whether alcohol is safe for you. Tell your caregiver if you drink alcohol several times a week.  Follow up with your caregiver regularly.  Schedule an eye exam soon after the diagnosis of type 2 diabetes and then annually.  Perform daily skin and foot care. Examine your skin and feet daily for cuts, bruises, redness, nail problems, bleeding, blisters, or sores. A foot exam by a caregiver should be done annually.  Brush your teeth and gums at least twice a day and floss at least once a day. Follow up with your dentist regularly.  Share your diabetes management plan with your workplace or school.  Stay up-to-date with immunizations.  Learn to manage stress.  Obtain ongoing diabetes education and support as needed.  Participate in, or seek rehabilitation as needed to maintain or improve independence and quality of life. Request a physical or occupational therapy referral if you are having foot or hand numbness or difficulties with grooming,   dressing, eating, or physical activity. SEEK MEDICAL CARE IF:   You are unable to eat food or drink fluids for more than 6 hours.  You have nausea and vomiting for more than 6 hours.  Your blood glucose level is over 240 mg/dL.  There is a change in mental status.  You develop an additional serious illness.  You have diarrhea for more than 6 hours.  You have been sick or have had a fever for a couple of days and are not getting better.  You have pain during any physical activity.  SEEK IMMEDIATE MEDICAL CARE IF:  You have difficulty breathing.  You have moderate to large ketone levels. MAKE SURE YOU:  Understand these instructions.  Will watch your condition.  Will get help right away if  you are not doing well or get worse. Document Released: 06/24/2005 Document Revised: 03/18/2012 Document Reviewed: 01/21/2012 [employer] Patient Information 2014 [employer].  

## 2013-09-21 NOTE — Progress Notes (Signed)
Subjective:    Patient ID: Cody Hale, male    DOB: 1950/11/29, 63 y.o.   MRN: [id number]  Diabetes He presents for his follow-up diabetic visit. He has type 2 diabetes mellitus. His disease course has been worsening. Hypoglycemia symptoms include nervousness/anxiousness. Pertinent negatives for hypoglycemia include no confusion, dizziness or tremors. Pertinent negatives for diabetes include no blurred vision, no chest pain, no fatigue, no foot paresthesias, no foot ulcerations, no polydipsia, no polyphagia, no polyuria, no visual change, no weakness and no weight loss. There are no hypoglycemic complications. Diabetic complications include heart disease. When asked about current treatments, none were reported. His weight is increasing steadily. He is following a generally healthy diet. Meal planning includes avoidance of concentrated sweets. He has not had a previous visit with a dietician. He never participates in exercise. His home blood glucose trend is increasing steadily. An ACE inhibitor/angiotensin II receptor blocker is being taken. He does not see a podiatrist.Eye exam is not current.      Review of Systems  Constitutional: Positive for unexpected weight change (some weight gain). Negative for fever, chills, weight loss, diaphoresis, activity change, appetite change and fatigue.  HENT: Negative.   Eyes: Negative.  Negative for blurred vision.  Respiratory: Negative.  Negative for cough, choking, chest tightness, shortness of breath, wheezing and stridor.   Cardiovascular: Negative.  Negative for chest pain, palpitations and leg swelling.  Gastrointestinal: Negative.  Negative for nausea, vomiting, abdominal pain, diarrhea, constipation and blood in stool.  Endocrine: Negative.  Negative for polydipsia, polyphagia and polyuria.  Genitourinary: Negative.   Musculoskeletal: Positive for arthralgias. Negative for back pain, gait problem, joint swelling, myalgias, neck pain and neck  stiffness.  Skin: Negative.   Allergic/Immunologic: Negative.   Neurological: Negative.  Negative for dizziness, tremors, weakness and light-headedness.  Hematological: Negative.  Negative for adenopathy. Does not bruise/bleed easily.  Psychiatric/Behavioral: Positive for dysphoric mood. Negative for suicidal ideas, hallucinations, behavioral problems, confusion, sleep disturbance, self-injury, decreased concentration and agitation. The patient is nervous/anxious. The patient is not hyperactive.        Objective:   Physical Exam  Vitals reviewed. Constitutional: He is oriented to person, place, and time. He appears well-developed and well-nourished. No distress.  HENT:  Head: Normocephalic and atraumatic.  Mouth/Throat: Oropharynx is clear and moist. No oropharyngeal exudate.  Eyes: Conjunctivae are normal. Right eye exhibits no discharge. Left eye exhibits no discharge. No scleral icterus.  Neck: Normal range of motion. Neck supple. No JVD present. No tracheal deviation present. No thyromegaly present.  Cardiovascular: Normal rate, regular rhythm, normal heart sounds and intact distal pulses.  Exam reveals no gallop and no friction rub.   No murmur heard. Pulmonary/Chest: Effort normal and breath sounds normal. No stridor. No respiratory distress. He has no wheezes. He has no rales. He exhibits no tenderness.  Abdominal: Soft. Bowel sounds are normal. He exhibits no distension and no mass. There is no tenderness. There is no rebound and no guarding.  Musculoskeletal: Normal range of motion. He exhibits no edema and no tenderness.  Lymphadenopathy:    He has no cervical adenopathy.  Neurological: He is oriented to person, place, and time.  Skin: Skin is warm and dry. No rash noted. He is not diaphoretic. No erythema. No pallor.  Psychiatric: His behavior is normal. Judgment and thought content normal. His mood appears anxious. His affect is not angry, not blunt, not labile and not  inappropriate. His speech is not rapid and/or pressured, not delayed  and not tangential. Cognition and memory are normal. He does not exhibit a depressed mood. He expresses no homicidal and no suicidal ideation. He expresses no suicidal plans and no homicidal plans.     Lab Results  Component Value Date   WBC 5.7 06/07/2013   HGB 13.8 06/07/2013   HCT 42.5 06/07/2013   PLT 119* 06/07/2013   GLUCOSE 157* 06/07/2013   CHOL 138 03/19/2013   TRIG 153.0* 03/19/2013   HDL 35.60* 03/19/2013   LDLCALC 72 03/19/2013   ALT 17 06/03/2013   AST 18 06/03/2013   NA 142 06/07/2013   K 4.1 06/07/2013   CL 100 06/07/2013   CREATININE 0.91 06/07/2013   BUN 22 06/07/2013   CO2 35* 06/07/2013   TSH 0.929 06/04/2013   INR 1.13 11/30/2012   HGBA1C 6.7* 06/04/2013       Assessment & Plan:

## 2013-09-22 ENCOUNTER — Telehealth: Payer: Self-pay | Admitting: Internal Medicine

## 2013-09-22 NOTE — Telephone Encounter (Signed)
Relevant patient education mailed to patient.  

## 2013-09-22 NOTE — Assessment & Plan Note (Signed)
His A1C is to, I have asked him to start treating this with kombiglyze

## 2013-09-22 NOTE — Assessment & Plan Note (Signed)
Cont trazodone and klonopin as needed

## 2013-09-22 NOTE — Assessment & Plan Note (Signed)
He will take tramadol as needed for pain

## 2013-09-22 NOTE — Assessment & Plan Note (Signed)
His BP is well controlled 

## 2013-09-22 NOTE — Assessment & Plan Note (Signed)
His fluid status is normal today

## 2013-09-28 ENCOUNTER — Ambulatory Visit: Payer: Self-pay | Admitting: Internal Medicine

## 2013-10-29 ENCOUNTER — Ambulatory Visit (INDEPENDENT_AMBULATORY_CARE_PROVIDER_SITE_OTHER): Payer: BC Managed Care – PPO | Admitting: Cardiovascular Disease

## 2013-10-29 ENCOUNTER — Encounter: Payer: Self-pay | Admitting: Cardiovascular Disease

## 2013-10-29 VITALS — BP 135/75 | HR 73 | Ht 69.0 in | Wt 270.0 lb

## 2013-10-29 DIAGNOSIS — Z952 Presence of prosthetic heart valve: Secondary | ICD-10-CM

## 2013-10-29 DIAGNOSIS — I5022 Chronic systolic (congestive) heart failure: Secondary | ICD-10-CM

## 2013-10-29 DIAGNOSIS — Z954 Presence of other heart-valve replacement: Secondary | ICD-10-CM

## 2013-10-29 DIAGNOSIS — E785 Hyperlipidemia, unspecified: Secondary | ICD-10-CM

## 2013-10-29 DIAGNOSIS — I10 Essential (primary) hypertension: Secondary | ICD-10-CM

## 2013-10-29 DIAGNOSIS — Z7182 Exercise counseling: Secondary | ICD-10-CM

## 2013-10-29 DIAGNOSIS — K089 Disorder of teeth and supporting structures, unspecified: Secondary | ICD-10-CM
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
## 2013-10-29 NOTE — Patient Instructions (Signed)
Continue all current medications. Your physician wants you to follow up in: 6 months.  You will receive a reminder letter in the mail one-two months in advance.  If you don't receive a letter, please call our office to schedule the follow up appointment   

## 2013-12-12 IMAGING — MR MR HEAD W/O CM
5 of 8 series · 28 of 48 positions shown · non-contrast
Comparison: CT head 02/21/2012

CLINICAL DATA: Renal failure with history of endocarditis.  Rule
out septic emboli

MRI HEAD WITHOUT CONTRAST
TECHNIQUE: Multiplanar, multiecho pulse sequences of the brain and
surrounding structures were obtained according to standard protocol
without intravenous contrast.

[Series 3: DWI · axial · 5.0mm · 1.09mm/px · z∈[-10,+129]mm · 8 of 54 slices shown (1 of 2)]
[im 1/54]
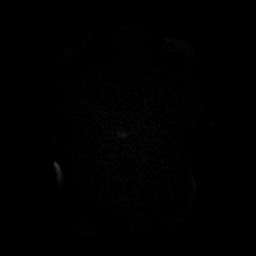
[im 6/54]
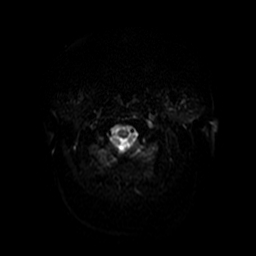
[im 18/54]
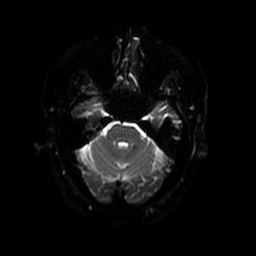
[im 24/54]
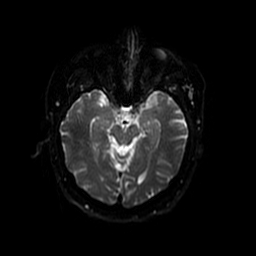
[im 30/54]
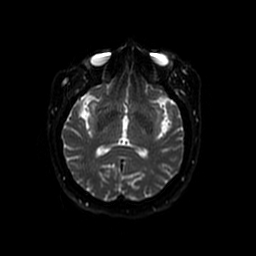
[im 36/54]
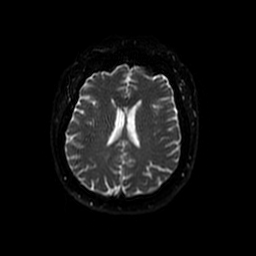
[im 48/54]
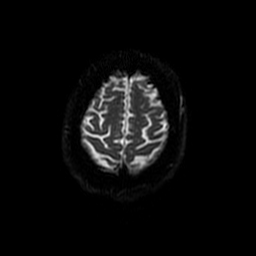
[im 54/54]
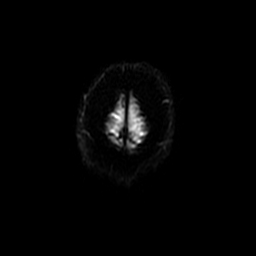

[Series 5: T2 · axial · 5.0mm · 0.47mm/px · z∈[-11,+127]mm · 5 of 23 slices shown (1 of 2)]
[im 1/23]
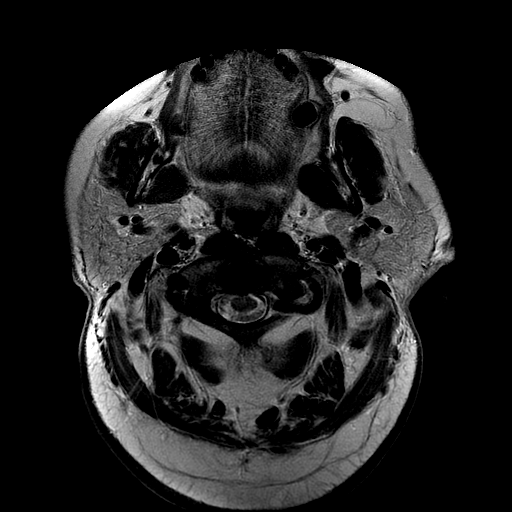
[im 6/23]
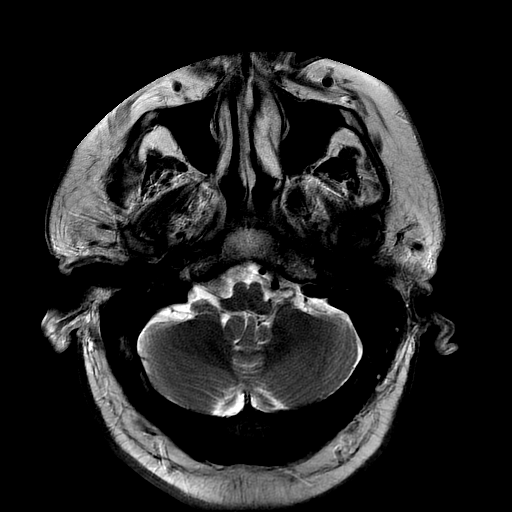
[im 12/23]
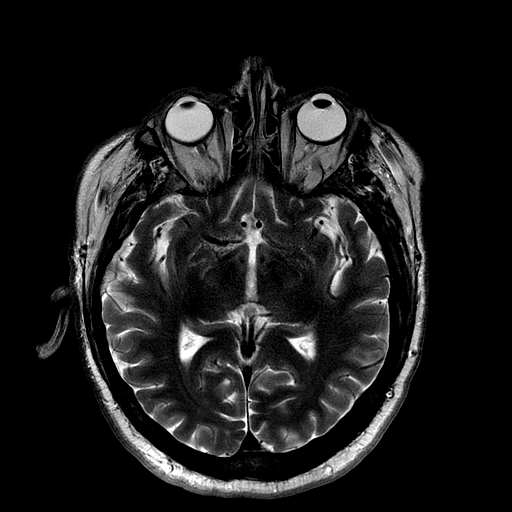
[im 17/23]
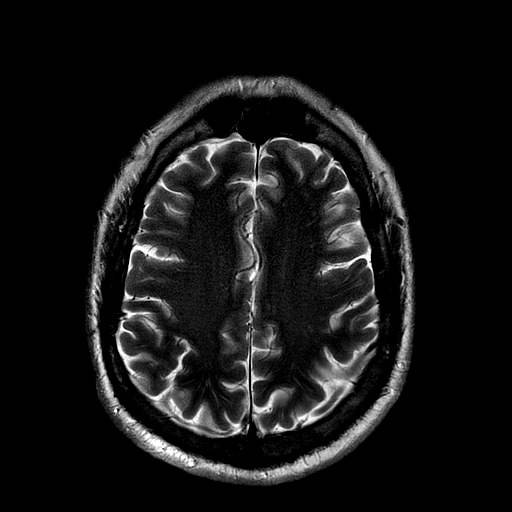
[im 23/23]
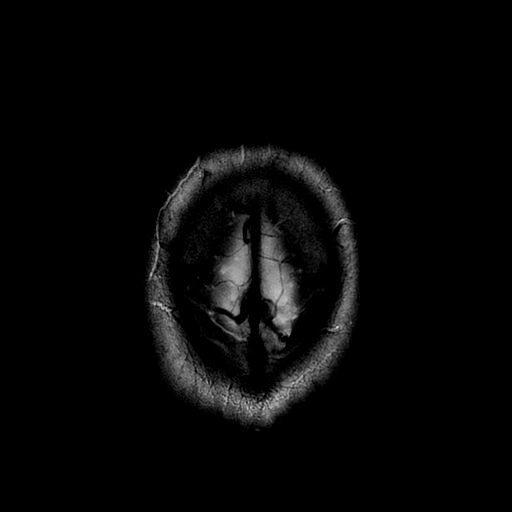

[Series 6: FLAIR · axial · 5.0mm · 0.47mm/px · z∈[-11,+127]mm · 5 of 23 slices shown]
[im 1/23]
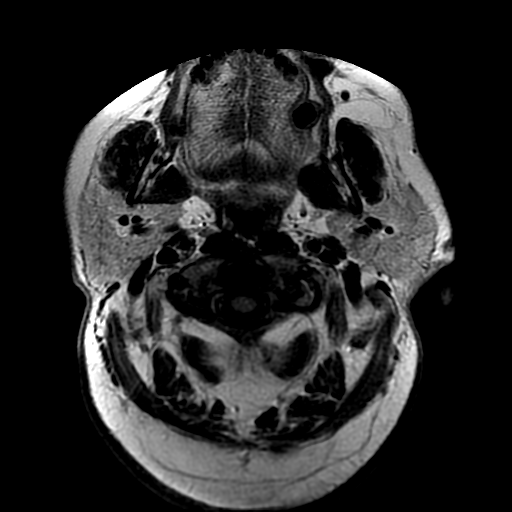
[im 6/23]
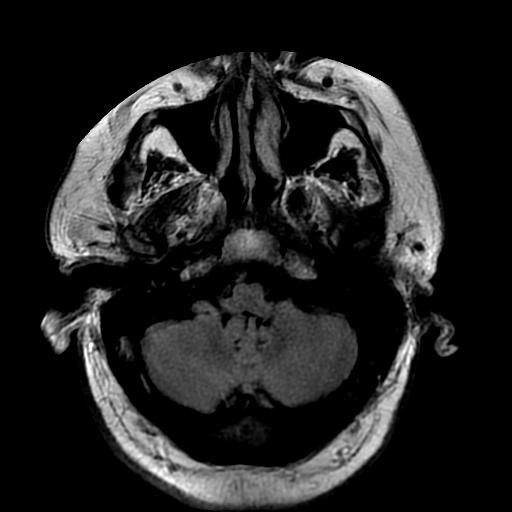
[im 12/23]
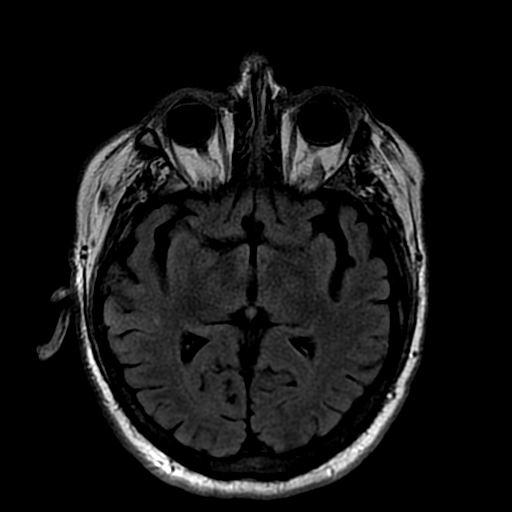
[im 17/23]
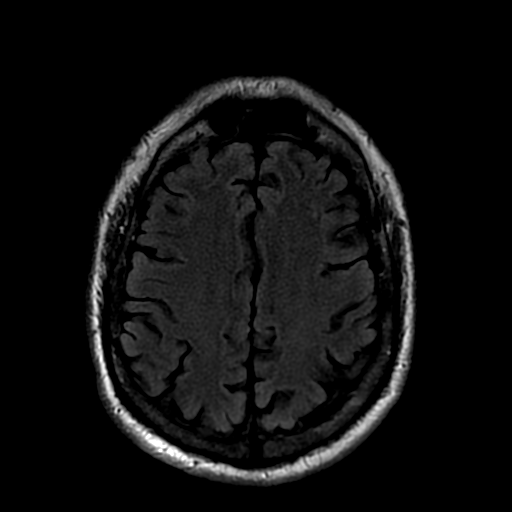
[im 23/23]
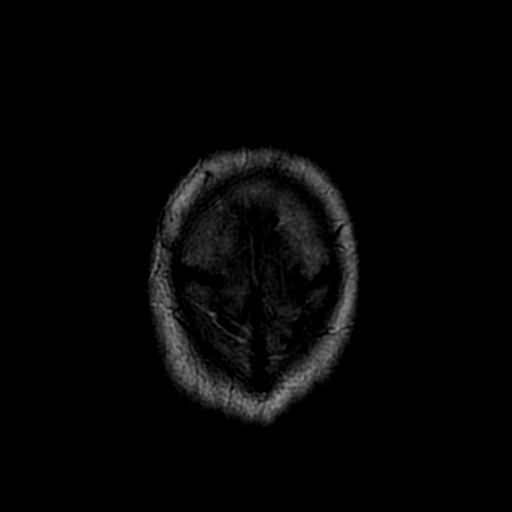

[Series 9: T2 · coronal · 5.0mm · 0.39mm/px · 5 of 24 slices shown (2 of 2)]
[im 1/24]
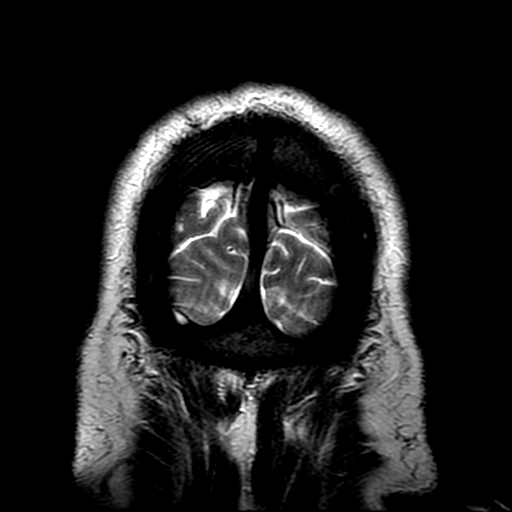
[im 6/24]
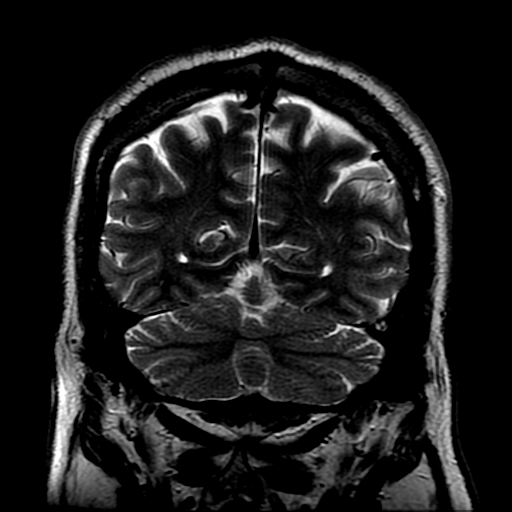
[im 12/24]
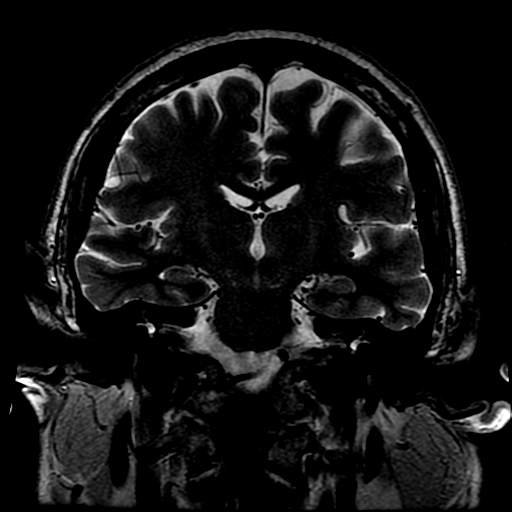
[im 18/24]
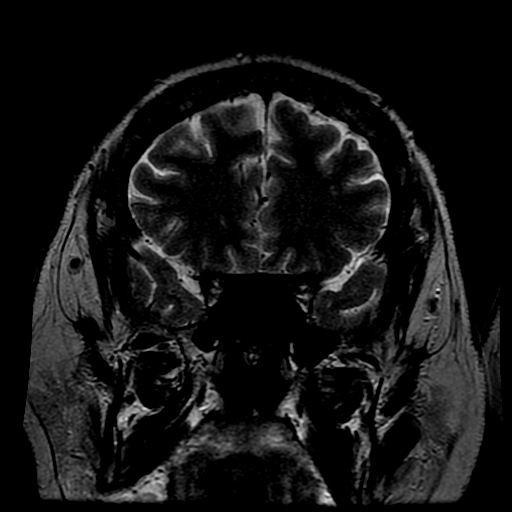
[im 24/24]
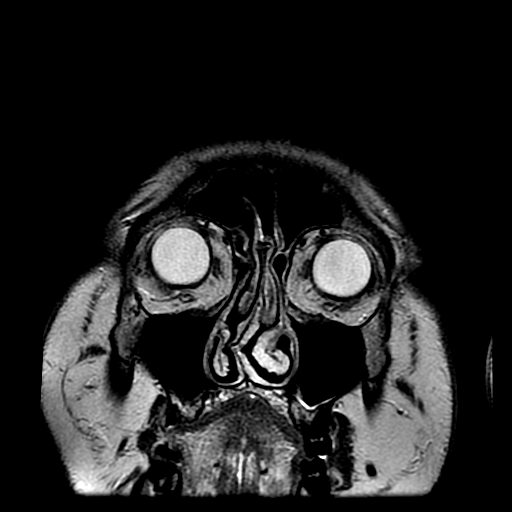

[Series 300: DWI · axial · 5.0mm · 1.09mm/px · z∈[-10,+129]mm · 5 of 27 slices shown (2 of 2)]
[im 1/27]
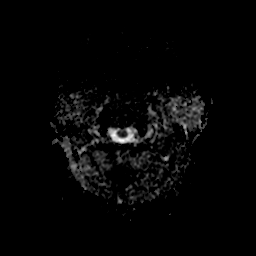
[im 7/27]
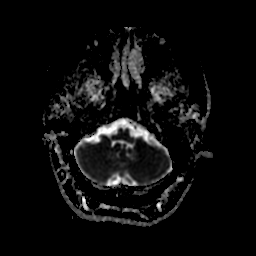
[im 14/27]
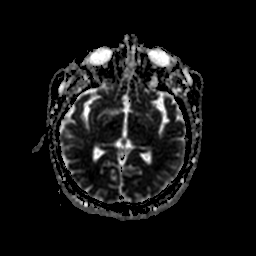
[im 20/27]
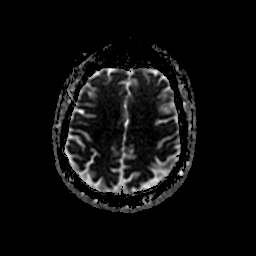
[im 27/27]
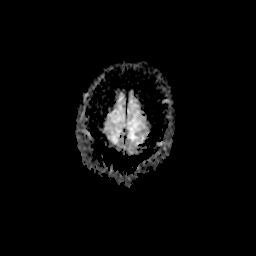

[28 of 48 positions shown; findings below may reference images not displayed]

FINDINGS: Negative for acute or chronic infarct.  No hemorrhage or
mass lesion is present.  There is no edema in the brain.  No
evidence of septic emboli.

Cerebral white matter is normal.  Brainstem is normal.  Cerebellum
appears normal.

Paranasal sinuses show mild mucosal edema.  Vessels at the base of
the brain are patent.
IMPRESSION: Normal MRI of the brain.

Mild chronic sinusitis.

## 2014-01-24 ENCOUNTER — Ambulatory Visit: Payer: Self-pay | Admitting: Internal Medicine

## 2014-01-31 ENCOUNTER — Ambulatory Visit (INDEPENDENT_AMBULATORY_CARE_PROVIDER_SITE_OTHER): Payer: BC Managed Care – PPO | Admitting: Internal Medicine

## 2014-01-31 ENCOUNTER — Other Ambulatory Visit (INDEPENDENT_AMBULATORY_CARE_PROVIDER_SITE_OTHER): Payer: BC Managed Care – PPO

## 2014-01-31 ENCOUNTER — Encounter: Payer: Self-pay | Admitting: Internal Medicine

## 2014-01-31 VITALS — BP 108/62 | HR 104 | Temp 98.5°F | Resp 16 | Ht 69.0 in | Wt 282.0 lb

## 2014-01-31 DIAGNOSIS — D518 Other vitamin B12 deficiency anemias: Secondary | ICD-10-CM

## 2014-01-31 DIAGNOSIS — E785 Hyperlipidemia, unspecified: Secondary | ICD-10-CM

## 2014-01-31 DIAGNOSIS — E1129 Type 2 diabetes mellitus with other diabetic kidney complication: Secondary | ICD-10-CM

## 2014-01-31 DIAGNOSIS — E1165 Type 2 diabetes mellitus with hyperglycemia: Secondary | ICD-10-CM

## 2014-01-31 DIAGNOSIS — I10 Essential (primary) hypertension: Secondary | ICD-10-CM

## 2014-01-31 DIAGNOSIS — D519 Vitamin B12 deficiency anemia, unspecified: Secondary | ICD-10-CM

## 2014-01-31 DIAGNOSIS — D510 Vitamin B12 deficiency anemia due to intrinsic factor deficiency: Secondary | ICD-10-CM

## 2014-01-31 LAB — COMPREHENSIVE METABOLIC PANEL
ALT: 30 U/L (ref 0–53)
AST: 22 U/L (ref 0–37)
Albumin: 4.1 g/dL (ref 3.5–5.2)
Alkaline Phosphatase: 95 U/L (ref 39–117)
BUN: 13 mg/dL (ref 6–23)
CO2: 31 mEq/L (ref 19–32)
Calcium: 9.2 mg/dL (ref 8.4–10.5)
Chloride: 101 mEq/L (ref 96–112)
Creatinine, Ser: 1 mg/dL (ref 0.4–1.5)
GFR: 78.33 mL/min (ref >60.00)
Glucose, Bld: 219 mg/dL — ABNORMAL HIGH (ref 70–99)
Potassium: 4 mEq/L (ref 3.5–5.1)
Sodium: 140 mEq/L (ref 135–145)
Total Bilirubin: 0.9 mg/dL (ref 0.2–1.2)
Total Protein: 7.1 g/dL (ref 6.0–8.3)

## 2014-01-31 LAB — LIPID PANEL
Cholesterol: 157 mg/dL (ref 0–200)
HDL: 29.5 mg/dL — ABNORMAL LOW (ref >39.00)
NonHDL: 127.5
Total CHOL/HDL Ratio: 5
Triglycerides: 356 mg/dL — ABNORMAL HIGH (ref 0.0–149.0)
VLDL: 71.2 mg/dL — ABNORMAL HIGH (ref 0.0–40.0)

## 2014-01-31 LAB — CBC WITH DIFFERENTIAL/PLATELET
Basophils Absolute: 0 10*3/uL (ref 0.0–0.1)
Basophils Relative: 0.5 % (ref 0.0–3.0)
Eosinophils Absolute: 0.1 10*3/uL (ref 0.0–0.7)
Eosinophils Relative: 2.3 % (ref 0.0–5.0)
HCT: 43 % (ref 39.0–52.0)
Hemoglobin: 14.7 g/dL (ref 13.0–17.0)
Lymphocytes Relative: 21.2 % (ref 12.0–46.0)
Lymphs Abs: 1.1 10*3/uL (ref 0.7–4.0)
MCHC: 34.2 g/dL (ref 30.0–36.0)
MCV: 90.8 fl (ref 78.0–100.0)
Monocytes Absolute: 0.5 10*3/uL (ref 0.1–1.0)
Monocytes Relative: 10.8 % (ref 3.0–12.0)
Neutro Abs: 3.3 10*3/uL (ref 1.4–7.7)
Neutrophils Relative %: 65.2 % (ref 43.0–77.0)
Platelets: 101 10*3/uL — ABNORMAL LOW (ref 150.0–400.0)
RBC: 4.73 Mil/uL (ref 4.22–5.81)
RDW: 14.7 % (ref 11.5–15.5)
WBC: 5 10*3/uL (ref 4.0–10.5)

## 2014-01-31 LAB — LDL CHOLESTEROL, DIRECT: Direct LDL: 84.2 mg/dL

## 2014-01-31 LAB — VITAMIN B12: Vitamin B-12: 255 pg/mL (ref 211–911)

## 2014-01-31 LAB — HEMOGLOBIN A1C: Hgb A1c MFr Bld: 8.7 % — ABNORMAL HIGH (ref 4.6–6.5)

## 2014-01-31 LAB — TSH: TSH: 0.81 u[IU]/mL (ref 0.35–4.50)

## 2014-01-31 MED ORDER — ATORVASTATIN CALCIUM 40 MG PO TABS: 40.0000 mg | ORAL_TABLET | Freq: Every day | ORAL | Status: DC

## 2014-01-31 MED ORDER — SAXAGLIPTIN-METFORMIN ER 5-500 MG PO TB24: 1.0000 | ORAL_TABLET | Freq: Every day | ORAL | Status: DC

## 2014-01-31 NOTE — Progress Notes (Signed)
Pre visit review using our clinic review tool, if applicable. No additional management support is needed unless otherwise documented below in the visit note. 

## 2014-01-31 NOTE — Patient Instructions (Signed)

## 2014-01-31 NOTE — Progress Notes (Signed)
Subjective:    Patient ID: Cody Hale, male    DOB: [birth year], 63 y.o.   MRN: [id number]  Diabetes He presents for his follow-up diabetic visit. He has type 2 diabetes mellitus. The initial diagnosis of diabetes was made 3 years ago. His disease course has been worsening. Hypoglycemia symptoms include nervousness/anxiousness. Pertinent negatives for hypoglycemia include no confusion, dizziness, headaches or tremors. Pertinent negatives for diabetes include no blurred vision, no chest pain, no fatigue, no foot paresthesias, no foot ulcerations, no polydipsia, no polyphagia, no polyuria, no visual change, no weakness and no weight loss. There are no hypoglycemic complications. Diabetic complications include heart disease. Current diabetic treatment includes oral agent (dual therapy). He is compliant with treatment some of the time. His weight is increasing steadily. He is following a generally unhealthy diet. When asked about meal planning, he reported none. He has not had a previous visit with a dietician. He never participates in exercise. His home blood glucose trend is increasing steadily. An ACE inhibitor/angiotensin II receptor blocker is contraindicated. He does not see a podiatrist.Eye exam is not current.      Review of Systems  Constitutional: Negative.  Negative for fever, chills, weight loss, diaphoresis, appetite change and fatigue.  HENT: Negative.   Eyes: Negative.  Negative for blurred vision.  Respiratory: Negative.  Negative for cough, choking, chest tightness, shortness of breath and stridor.   Cardiovascular: Negative.  Negative for chest pain, palpitations and leg swelling.  Gastrointestinal: Negative.  Negative for nausea, vomiting, abdominal pain, diarrhea and constipation.  Endocrine: Negative.  Negative for polydipsia, polyphagia and polyuria.  Genitourinary: Negative.   Musculoskeletal: Positive for arthralgias. Negative for back pain, joint swelling, myalgias and  neck stiffness.  Skin: Negative.  Negative for rash.  Allergic/Immunologic: Negative.   Neurological: Negative.  Negative for dizziness, tremors, weakness, light-headedness, numbness and headaches.  Hematological: Negative.  Negative for adenopathy. Does not bruise/bleed easily.  Psychiatric/Behavioral: Positive for sleep disturbance. Negative for suicidal ideas, hallucinations, behavioral problems, confusion, self-injury, dysphoric mood, decreased concentration and agitation. The patient is nervous/anxious. The patient is not hyperactive.        Objective:   Physical Exam  Vitals reviewed. Constitutional: He is oriented to person, place, and time. He appears well-developed and well-nourished. No distress.  HENT:  Head: Normocephalic and atraumatic.  Mouth/Throat: Oropharynx is clear and moist. No oropharyngeal exudate.  Eyes: Conjunctivae are normal. Right eye exhibits no discharge. Left eye exhibits no discharge. No scleral icterus.  Neck: Normal range of motion. Neck supple. No JVD present. No tracheal deviation present. No thyromegaly present.  Cardiovascular: Normal rate, regular rhythm, normal heart sounds and intact distal pulses.  Exam reveals no gallop and no friction rub.   No murmur heard. Pulmonary/Chest: Effort normal and breath sounds normal. No stridor. No respiratory distress. He has no wheezes. He has no rales. He exhibits no tenderness.  Abdominal: Soft. Bowel sounds are normal. He exhibits no distension and no mass. There is no tenderness. There is no rebound and no guarding.  Musculoskeletal: Normal range of motion. He exhibits edema (1+ pitting edema in BLE). He exhibits no tenderness.  Lymphadenopathy:    He has no cervical adenopathy.  Neurological: He is oriented to person, place, and time.  Skin: Skin is warm and dry. No rash noted. He is not diaphoretic. No erythema. No pallor.  Psychiatric: He has a normal mood and affect. His behavior is normal. Judgment and  thought content normal.  Lab Results  Component Value Date   WBC 5.7 06/07/2013   HGB 13.8 06/07/2013   HCT 42.5 06/07/2013   PLT 119* 06/07/2013   GLUCOSE 156* 09/21/2013   CHOL 138 03/19/2013   TRIG 153.0* 03/19/2013   HDL 35.60* 03/19/2013   LDLCALC 72 03/19/2013   ALT 17 06/03/2013   AST 18 06/03/2013   NA 141 09/21/2013   K 4.2 09/21/2013   CL 102 09/21/2013   CREATININE 0.9 09/21/2013   BUN 18 09/21/2013   CO2 32 09/21/2013   TSH 0.929 06/04/2013   INR 1.13 11/30/2012   HGBA1C 7.3* 09/21/2013       Assessment & Plan:

## 2014-02-01 NOTE — Assessment & Plan Note (Signed)
crestor is too expensive Will change to generic lipitor

## 2014-02-01 NOTE — Assessment & Plan Note (Signed)
He has been out of meds for several weeks and his A1C is high Will restart meds and he will work on his lifestyle modifications

## 2014-02-01 NOTE — Assessment & Plan Note (Signed)
His BP is well controlled 

## 2014-02-01 NOTE — Assessment & Plan Note (Signed)
I will recheck his B12 and methylmalonic acid levels today

## 2014-02-02 ENCOUNTER — Encounter: Payer: Self-pay | Admitting: Internal Medicine

## 2014-02-02 LAB — METHYLMALONIC ACID, SERUM: Methylmalonic Acid, Quant: 458 nmol/L — ABNORMAL HIGH (ref 87–318)

## 2014-02-03 DIAGNOSIS — D519 Vitamin B12 deficiency anemia, unspecified: Secondary | ICD-10-CM | POA: Insufficient documentation

## 2014-02-03 MED ORDER — CYANOCOBALAMIN 2000 MCG PO TABS: 2000.0000 ug | ORAL_TABLET | Freq: Every day | ORAL | Status: DC

## 2014-02-03 NOTE — Assessment & Plan Note (Signed)
Start oral B12 replacement therapy

## 2014-02-03 NOTE — Addendum Note (Signed)
Addended by: [name] on: 02/03/2014 07:31 AM   Modules accepted: Orders

## 2014-02-07 IMAGING — CR DG CHEST 2V
2 series · 2 of 2 positions shown · non-contrast
Comparison: Prior chest x-ray 06/30/2012

CLINICAL DATA: Rule out CHF

CHEST - 2 VIEW

[w chest pa]
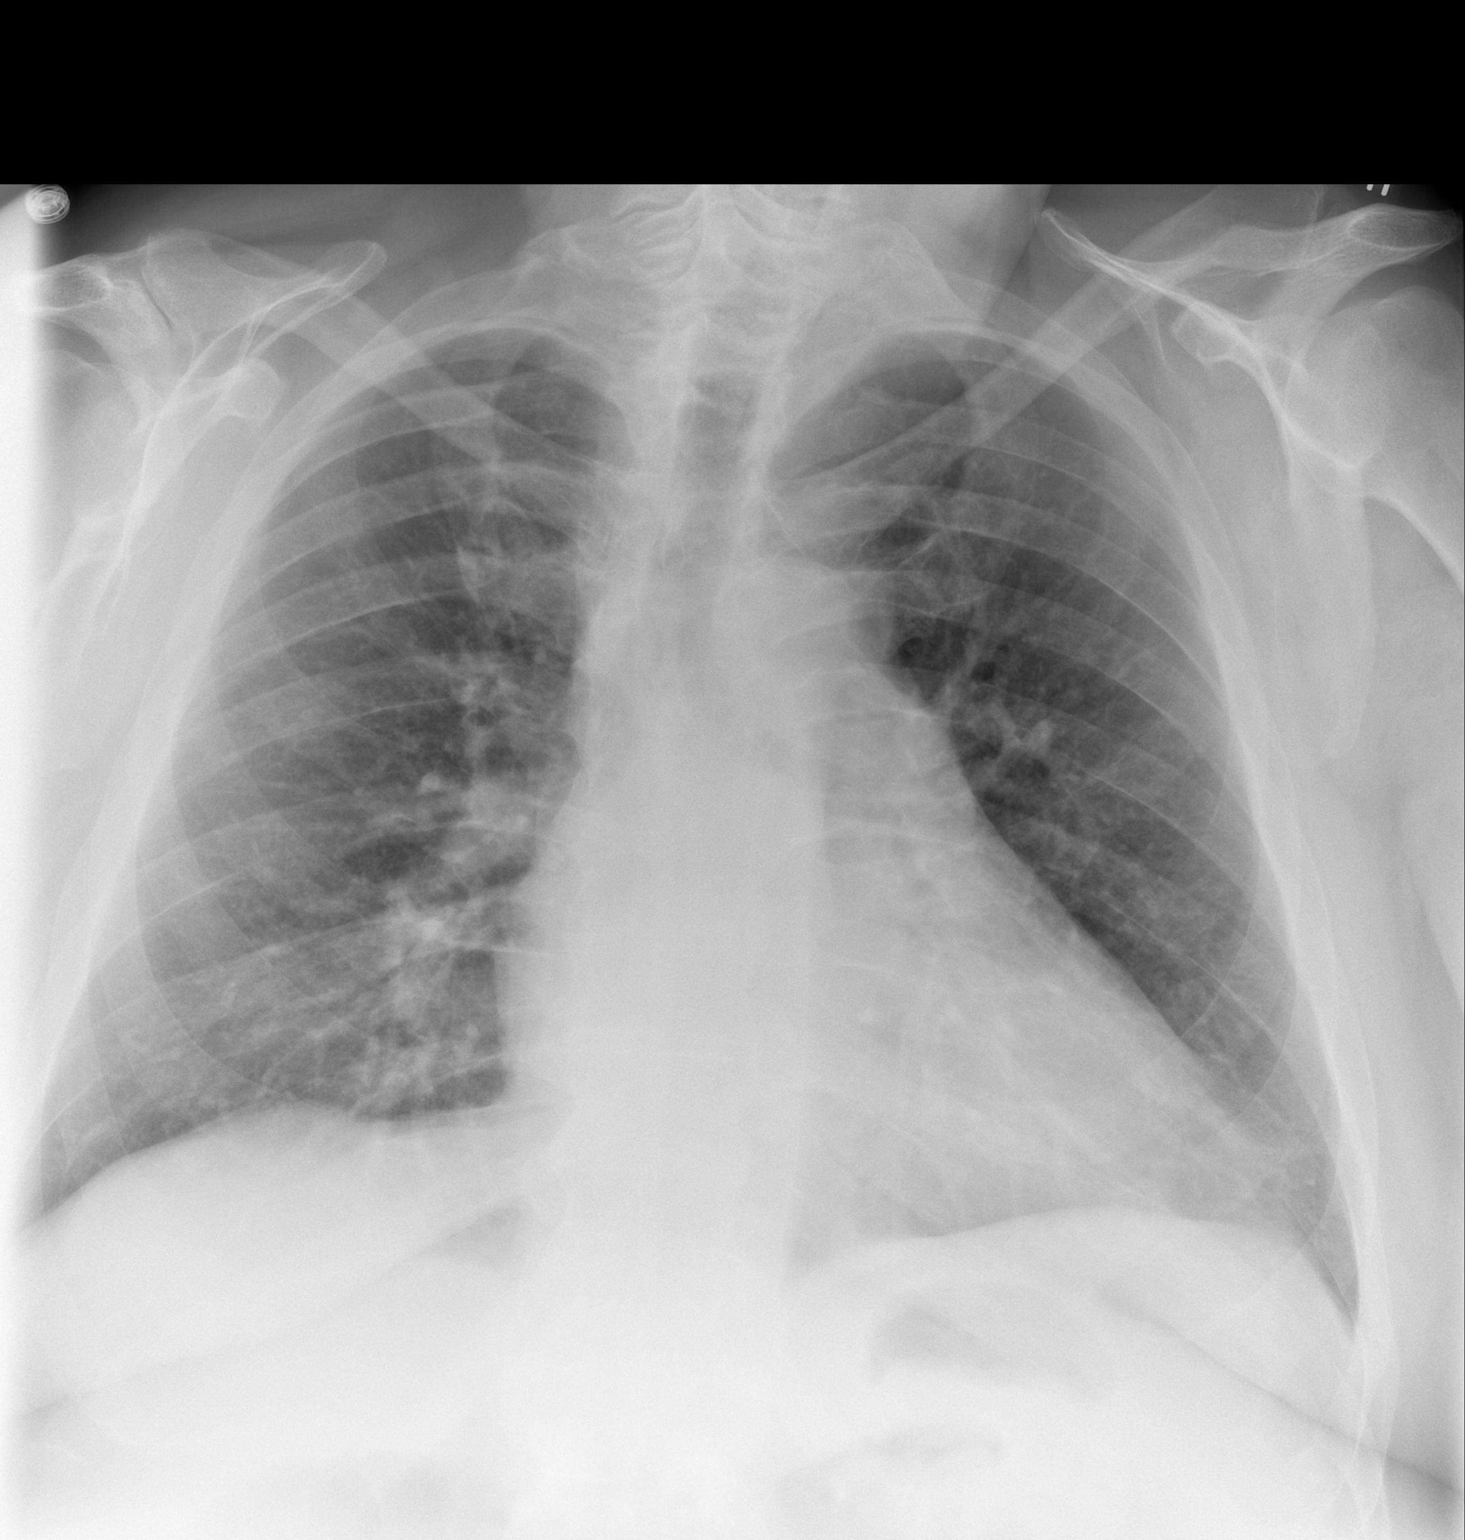

[w chest lat]
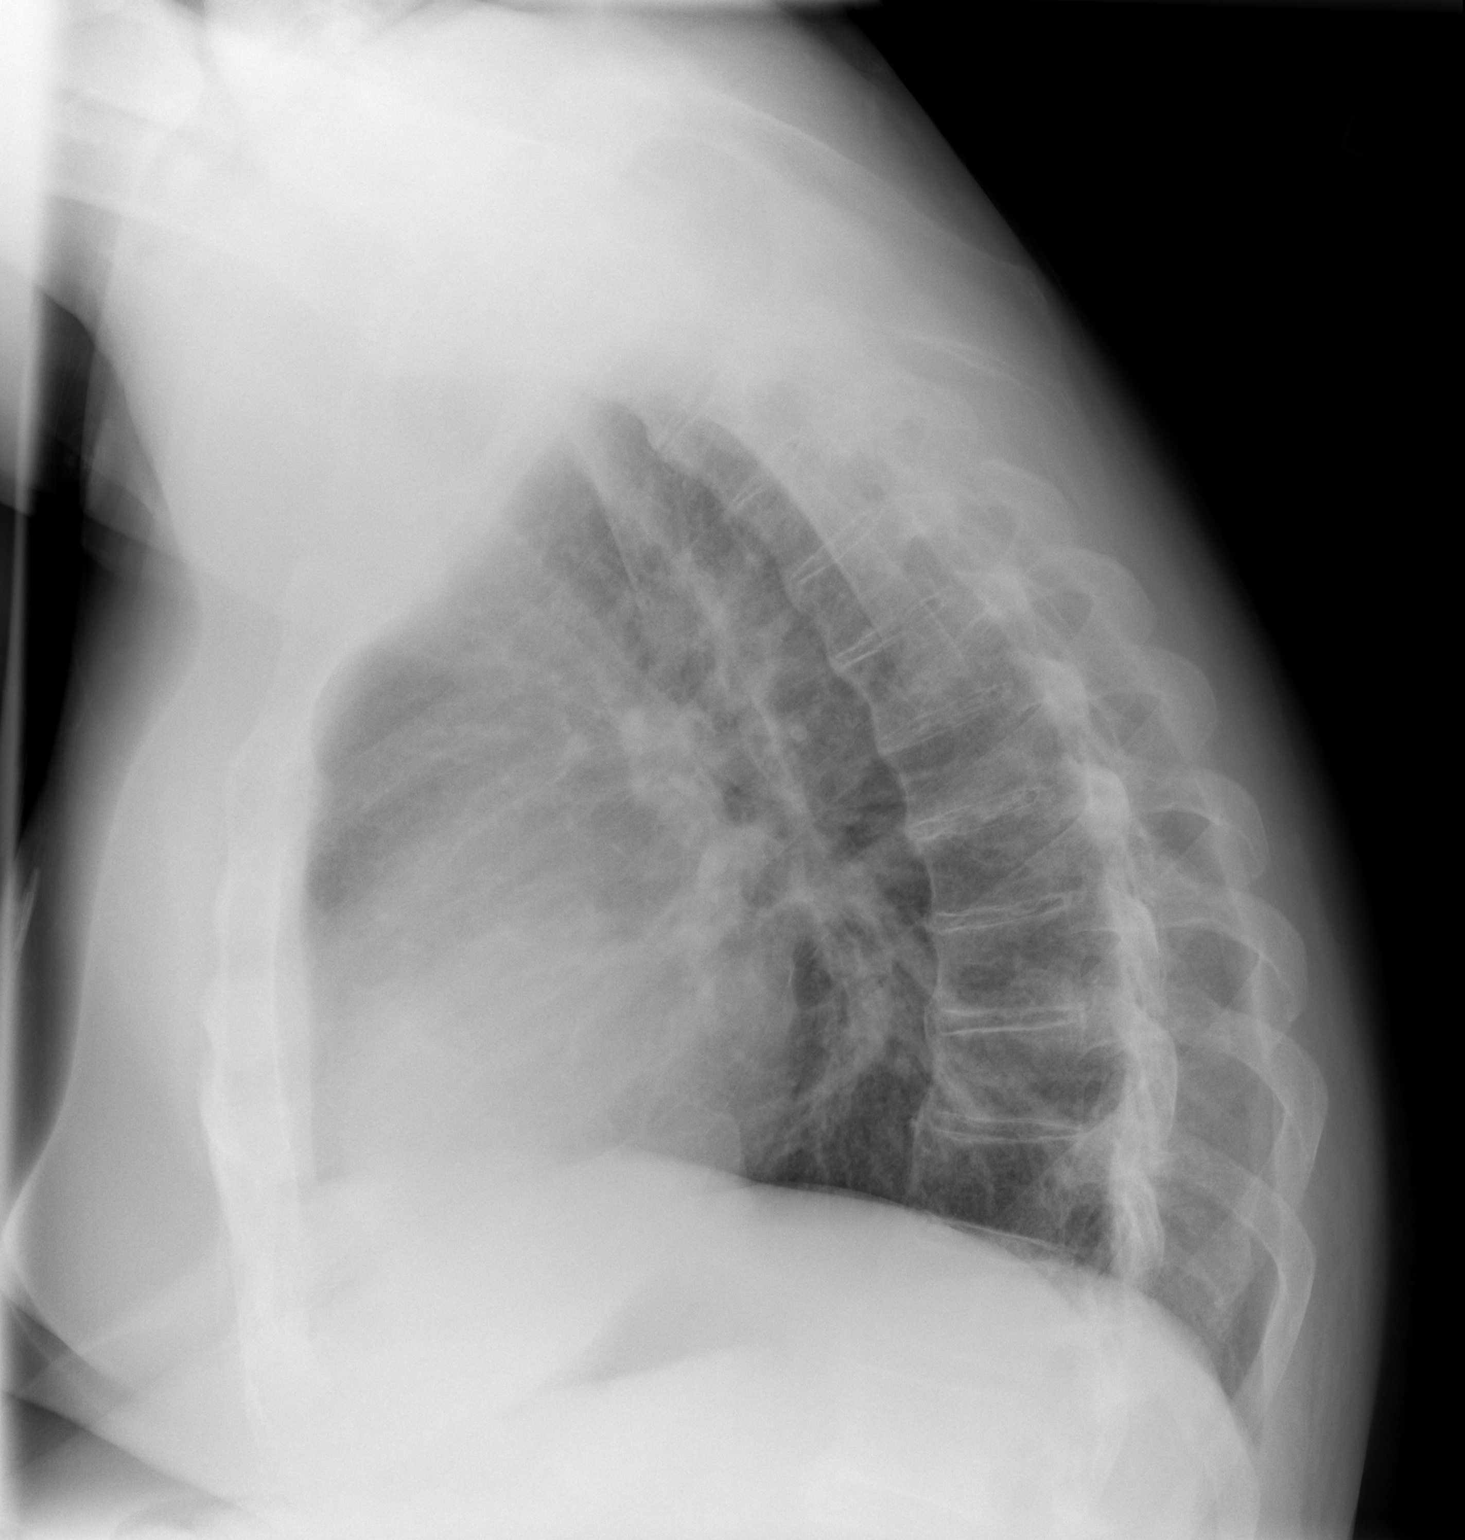

[2 of 2 positions shown; findings below may reference images not displayed]

FINDINGS: Stable cardiomegaly and pulmonary vascular congestion
without overt edema.  No focal airspace consolidation.  No pleural
effusion or pneumothorax.  No acute osseous abnormality.  Pulmonary
hyperexpansion and central bronchitic changes are unchanged
compared to prior.  Bridging anterior syndesmophytes throughout the
thoracic spine.
IMPRESSION: 1.  Stable cardiomegaly and pulmonary vascular congestion without
overt edema to suggest CHF.
2.  Stable background changes of COPD.
3.  Bridging anterior syndesmophytes throughout the thoracic spine
suggestive of ankylosing spondylosis.  Recommend clinical
correlation.

## 2014-05-05 ENCOUNTER — Ambulatory Visit: Payer: Self-pay | Admitting: Cardiovascular Disease

## 2014-05-06 ENCOUNTER — Encounter: Payer: Self-pay | Admitting: Cardiovascular Disease

## 2014-05-06 ENCOUNTER — Ambulatory Visit (INDEPENDENT_AMBULATORY_CARE_PROVIDER_SITE_OTHER): Payer: BC Managed Care – PPO | Admitting: Cardiovascular Disease

## 2014-05-06 VITALS — BP 117/74 | HR 89 | Ht 70.0 in | Wt 285.0 lb

## 2014-05-06 DIAGNOSIS — I10 Essential (primary) hypertension: Secondary | ICD-10-CM

## 2014-05-06 DIAGNOSIS — I4891 Unspecified atrial fibrillation: Secondary | ICD-10-CM

## 2014-05-06 DIAGNOSIS — E785 Hyperlipidemia, unspecified: Secondary | ICD-10-CM

## 2014-05-06 DIAGNOSIS — Z7189 Other specified counseling: Secondary | ICD-10-CM

## 2014-05-06 DIAGNOSIS — Z954 Presence of other heart-valve replacement: Secondary | ICD-10-CM

## 2014-05-06 DIAGNOSIS — I447 Left bundle-branch block, unspecified: Secondary | ICD-10-CM

## 2014-05-06 DIAGNOSIS — Z952 Presence of prosthetic heart valve: Secondary | ICD-10-CM

## 2014-05-06 DIAGNOSIS — I5022 Chronic systolic (congestive) heart failure: Secondary | ICD-10-CM

## 2014-05-06 DIAGNOSIS — E119 Type 2 diabetes mellitus without complications: Secondary | ICD-10-CM

## 2014-05-06 MED ORDER — RIVAROXABAN 20 MG PO TABS: 20.0000 mg | ORAL_TABLET | Freq: Every day | ORAL | Status: DC

## 2014-05-06 MED ORDER — CARVEDILOL 6.25 MG PO TABS: 6.2500 mg | ORAL_TABLET | Freq: Two times a day (BID) | ORAL | Status: DC
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
## 2014-05-06 NOTE — Patient Instructions (Signed)
   Stop Aspirin  Begin Xarelto 20mg  daily with evening meal (samples, 30 day trial offer, & 12 month no cost to you cards given)  Increase Coreg to 6.25mg  twice a day    Printed scripts provided on above. Continue all other medications.   Follow up with anticoagulation nurse in 1 month Vashti Hey, RN) Follow up in  3 months

## 2014-05-12 ENCOUNTER — Ambulatory Visit (INDEPENDENT_AMBULATORY_CARE_PROVIDER_SITE_OTHER): Payer: BC Managed Care – PPO | Admitting: Internal Medicine

## 2014-05-12 ENCOUNTER — Ambulatory Visit (INDEPENDENT_AMBULATORY_CARE_PROVIDER_SITE_OTHER)
Admission: RE | Admit: 2014-05-12 | Discharge: 2014-05-12 | Disposition: A | Discharge status: Home / Self Care | Payer: BC Managed Care – PPO | Source: Ambulatory Visit | Attending: Internal Medicine | Admitting: Internal Medicine

## 2014-05-12 ENCOUNTER — Encounter: Payer: Self-pay | Admitting: Internal Medicine

## 2014-05-12 VITALS — BP 110/70 | HR 73 | Temp 99.2°F | Resp 16 | Ht 69.0 in | Wt 284.0 lb

## 2014-05-12 DIAGNOSIS — J13 Pneumonia due to Streptococcus pneumoniae: Secondary | ICD-10-CM

## 2014-05-12 DIAGNOSIS — I10 Essential (primary) hypertension: Secondary | ICD-10-CM

## 2014-05-12 DIAGNOSIS — R059 Cough, unspecified: Secondary | ICD-10-CM

## 2014-05-12 DIAGNOSIS — R05 Cough: Secondary | ICD-10-CM

## 2014-05-12 DIAGNOSIS — J449 Chronic obstructive pulmonary disease, unspecified: Secondary | ICD-10-CM | POA: Insufficient documentation

## 2014-05-12 DIAGNOSIS — E118 Type 2 diabetes mellitus with unspecified complications: Secondary | ICD-10-CM

## 2014-05-12 DIAGNOSIS — J4489 Other specified chronic obstructive pulmonary disease: Secondary | ICD-10-CM

## 2014-05-12 MED ORDER — UMECLIDINIUM BROMIDE 62.5 MCG/INH IN AEPB: 1.0000 | INHALATION_SPRAY | Freq: Every day | RESPIRATORY_TRACT | Status: DC

## 2014-05-12 MED ORDER — AMOXICILLIN-POT CLAVULANATE 875-125 MG PO TABS: 1.0000 | ORAL_TABLET | Freq: Two times a day (BID) | ORAL | Status: DC

## 2014-05-12 MED ORDER — SITAGLIPTIN PHOS-METFORMIN HCL 50-1000 MG PO TABS: 1.0000 | ORAL_TABLET | Freq: Two times a day (BID) | ORAL | Status: DC

## 2014-05-12 MED ORDER — HYDROCODONE-HOMATROPINE 5-1.5 MG/5ML PO SYRP: 5.0000 mL | ORAL_SOLUTION | Freq: Three times a day (TID) | ORAL | Status: DC | PRN

## 2014-05-12 NOTE — Assessment & Plan Note (Signed)
He has not been able to afford the previously prescribed meds for DM so I gave him samples of janumet today

## 2014-05-12 NOTE — Assessment & Plan Note (Addendum)
Will start incruse for this - I gave him a sample and I showed him how to use it, he demonstrated proficiency with its use

## 2014-05-12 NOTE — Assessment & Plan Note (Signed)
I will check his CXR to screen for lung cancer, mass, edema, etc.

## 2014-05-12 NOTE — Progress Notes (Signed)
Subjective:    Patient ID: Cody Hale, male    DOB: 21-Dec-1950, 63 y.o.   MRN: [id number]  Cough This is a recurrent problem. The current episode started in the past 7 days. The problem has been gradually worsening. The problem occurs every few hours. The cough is productive of purulent sputum. Associated symptoms include chills, nasal congestion, postnasal drip, rhinorrhea, shortness of breath and wheezing. Pertinent negatives include no chest pain, ear congestion, ear pain, fever, headaches, heartburn, hemoptysis, myalgias, rash, sore throat, sweats or weight loss. Nothing aggravates the symptoms. Risk factors for lung disease include smoking/tobacco exposure. He has tried OTC cough suppressant for the symptoms. The treatment provided no relief. His past medical history is significant for bronchitis, COPD and pneumonia. There is no history of asthma, bronchiectasis, emphysema or environmental allergies.      Review of Systems  Constitutional: Positive for chills. Negative for fever, weight loss, diaphoresis, activity change, appetite change, fatigue and unexpected weight change.  HENT: Positive for postnasal drip and rhinorrhea. Negative for ear pain, sinus pressure and sore throat.   Eyes: Negative.   Respiratory: Positive for cough, shortness of breath and wheezing. Negative for hemoptysis.   Cardiovascular: Negative.  Negative for chest pain, palpitations and leg swelling.  Gastrointestinal: Negative.  Negative for heartburn, nausea, abdominal pain, diarrhea and constipation.  Endocrine: Positive for polyuria. Negative for polydipsia and polyphagia.  Genitourinary: Negative.   Musculoskeletal: Negative.  Negative for myalgias, back pain and arthralgias.  Skin: Negative.  Negative for rash.  Allergic/Immunologic: Negative.  Negative for environmental allergies.  Neurological: Negative.  Negative for headaches.  Hematological: Negative.  Negative for adenopathy. Does not bruise/bleed  easily.  Psychiatric/Behavioral: Negative.        Objective:   Physical Exam  Constitutional: He is oriented to person, place, and time. He appears well-developed and well-nourished.  Non-toxic appearance. He does not have a sickly appearance. He does not appear ill. No distress.  HENT:  Head: Normocephalic and atraumatic.  Mouth/Throat: Oropharynx is clear and moist. No oropharyngeal exudate.  Eyes: Conjunctivae are normal. Right eye exhibits no discharge. Left eye exhibits no discharge. No scleral icterus.  Neck: Normal range of motion. Neck supple. No JVD present. No tracheal deviation present. No thyromegaly present.  Cardiovascular: Normal rate, regular rhythm, normal heart sounds and intact distal pulses.  Exam reveals no gallop and no friction rub.   No murmur heard. Pulmonary/Chest: Effort normal. No accessory muscle usage or stridor. No respiratory distress. He has no decreased breath sounds. He has no wheezes. He has rhonchi in the right middle field, the right lower field, the left middle field and the left lower field. He has no rales. He exhibits no tenderness.  Abdominal: Soft. Bowel sounds are normal. He exhibits no distension and no mass. There is no tenderness. There is no rebound and no guarding.  Musculoskeletal: Normal range of motion. He exhibits no edema or tenderness.  Lymphadenopathy:    He has no cervical adenopathy.  Neurological: He is oriented to person, place, and time.  Skin: Skin is warm and dry. No rash noted. He is not diaphoretic. No erythema. No pallor.  Vitals reviewed.    Lab Results  Component Value Date   WBC 5.0 01/31/2014   HGB 14.7 01/31/2014   HCT 43.0 01/31/2014   PLT 101.0 Repeated and verified X2.* 01/31/2014   GLUCOSE 219* 01/31/2014   CHOL 157 01/31/2014   TRIG 356.0* 01/31/2014   HDL 29.50* 01/31/2014  LDLDIRECT 84.2 01/31/2014   LDLCALC 72 03/19/2013   ALT 30 01/31/2014   AST 22 01/31/2014   NA 140 01/31/2014   K 4.0  01/31/2014   CL 101 01/31/2014   CREATININE 1.0 01/31/2014   BUN 13 01/31/2014   CO2 31 01/31/2014   TSH 0.81 01/31/2014   INR 1.13 11/30/2012   HGBA1C 8.7* 01/31/2014       Assessment & Plan:

## 2014-05-12 NOTE — Progress Notes (Signed)
Pre visit review using our clinic review tool, if applicable. No additional management support is needed unless otherwise documented below in the visit note. 

## 2014-05-12 NOTE — Patient Instructions (Signed)
Cough, Adult  A cough is a reflex that helps clear your throat and airways. It can help heal the body or may be a reaction to an irritated airway. A cough may only last 2 or 3 weeks (acute) or may last more than 8 weeks (chronic).  CAUSES Acute cough:  Viral or bacterial infections. Chronic cough:  Infections.  Allergies.  Asthma.  Post-nasal drip.  Smoking.  Heartburn or acid reflux.  Some medicines.  Chronic lung problems (COPD).  Cancer. SYMPTOMS   Cough.  Fever.  Chest pain.  Increased breathing rate.  High-pitched whistling sound when breathing (wheezing).  Colored mucus that you cough up (sputum). TREATMENT   A bacterial cough may be treated with antibiotic medicine.  A viral cough must run its course and will not respond to antibiotics.  Your caregiver may recommend other treatments if you have a chronic cough. HOME CARE INSTRUCTIONS   Only take over-the-counter or prescription medicines for pain, discomfort, or fever as directed by your caregiver. Use cough suppressants only as directed by your caregiver.  Use a cold steam vaporizer or humidifier in your bedroom or home to help loosen secretions.  Sleep in a semi-upright position if your cough is worse at night.  Rest as needed.  Stop smoking if you smoke. SEEK IMMEDIATE MEDICAL CARE IF:   You have pus in your sputum.  Your cough starts to worsen.  You cannot control your cough with suppressants and are losing sleep.  You begin coughing up blood.  You have difficulty breathing.  You develop pain which is getting worse or is uncontrolled with medicine.  You have a fever. MAKE SURE YOU:   Understand these instructions.  Will watch your condition.  Will get help right away if you are not doing well or get worse. Document Released: 12/21/2010 Document Revised: 09/16/2011 Document Reviewed: 12/21/2010 Exit[employer] Patient Information 2015 [employer]. This information is not intended  to replace advice given to you by your health care provider. Make sure you discuss any questions you have with your health care provider.  

## 2014-05-13 IMAGING — CR DG CHEST 1V PORT
1 series · 1 of 1 positions shown · non-contrast
Comparison: [DATE]

CLINICAL DATA: Cough

PORTABLE CHEST - 1 VIEW

[view not recorded]
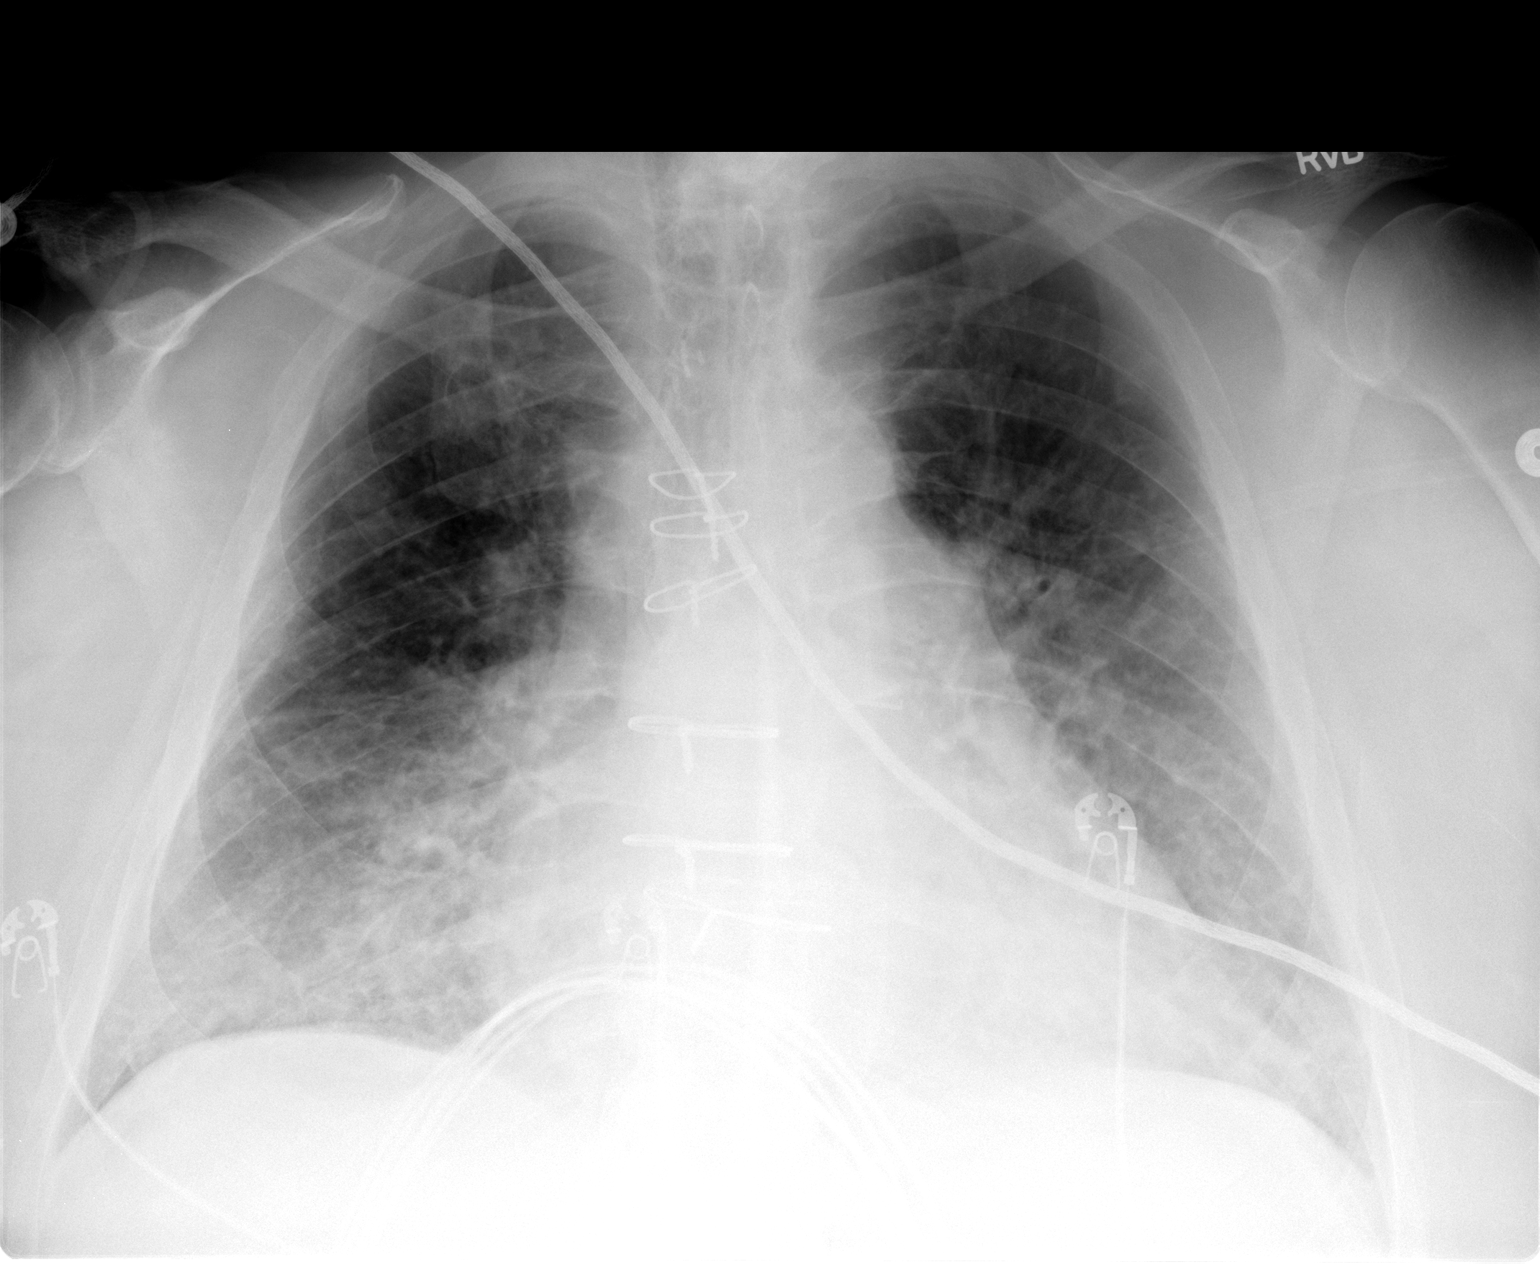

[1 of 1 positions shown; findings below may reference images not displayed]

FINDINGS: Sternotomy wires overlie is mildly enlarged heart
silhouette.  There is increased bibasilar air space disease
suggesting pulmonary edema.  Upper lungs are clear.  No
pneumothorax.
IMPRESSION: Bibasilar air space disease suggests pulmonary edema.

## 2014-05-15 DIAGNOSIS — J13 Pneumonia due to Streptococcus pneumoniae: Secondary | ICD-10-CM | POA: Insufficient documentation

## 2014-05-15 NOTE — Assessment & Plan Note (Signed)
Will treat the infection with augmentin 

## 2014-05-15 NOTE — Assessment & Plan Note (Signed)
His BP is well controlled 

## 2014-05-16 IMAGING — CR DG CHEST 2V
2 series · 2 of 2 positions shown · non-contrast
Comparison: 11/29/2012

CLINICAL DATA: CHF

CHEST - 2 VIEW

[view not recorded (1 of 2)]
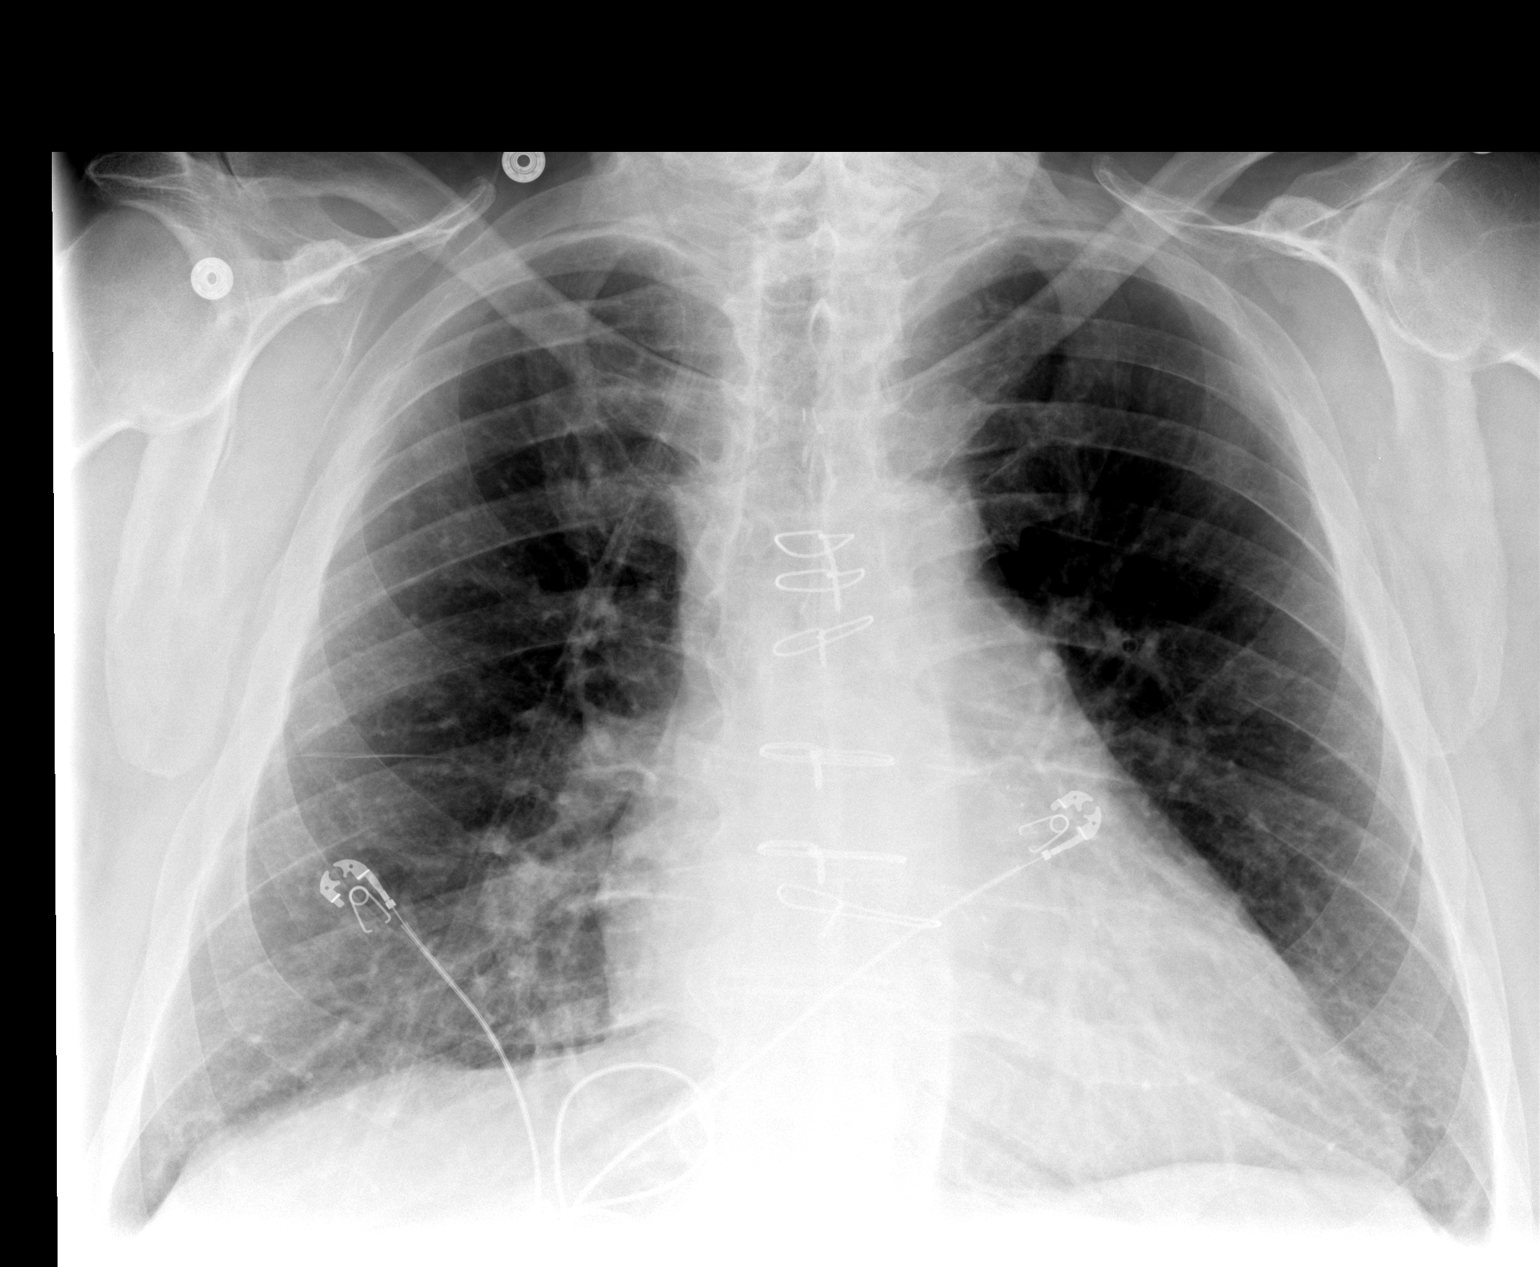

[view not recorded (2 of 2)]
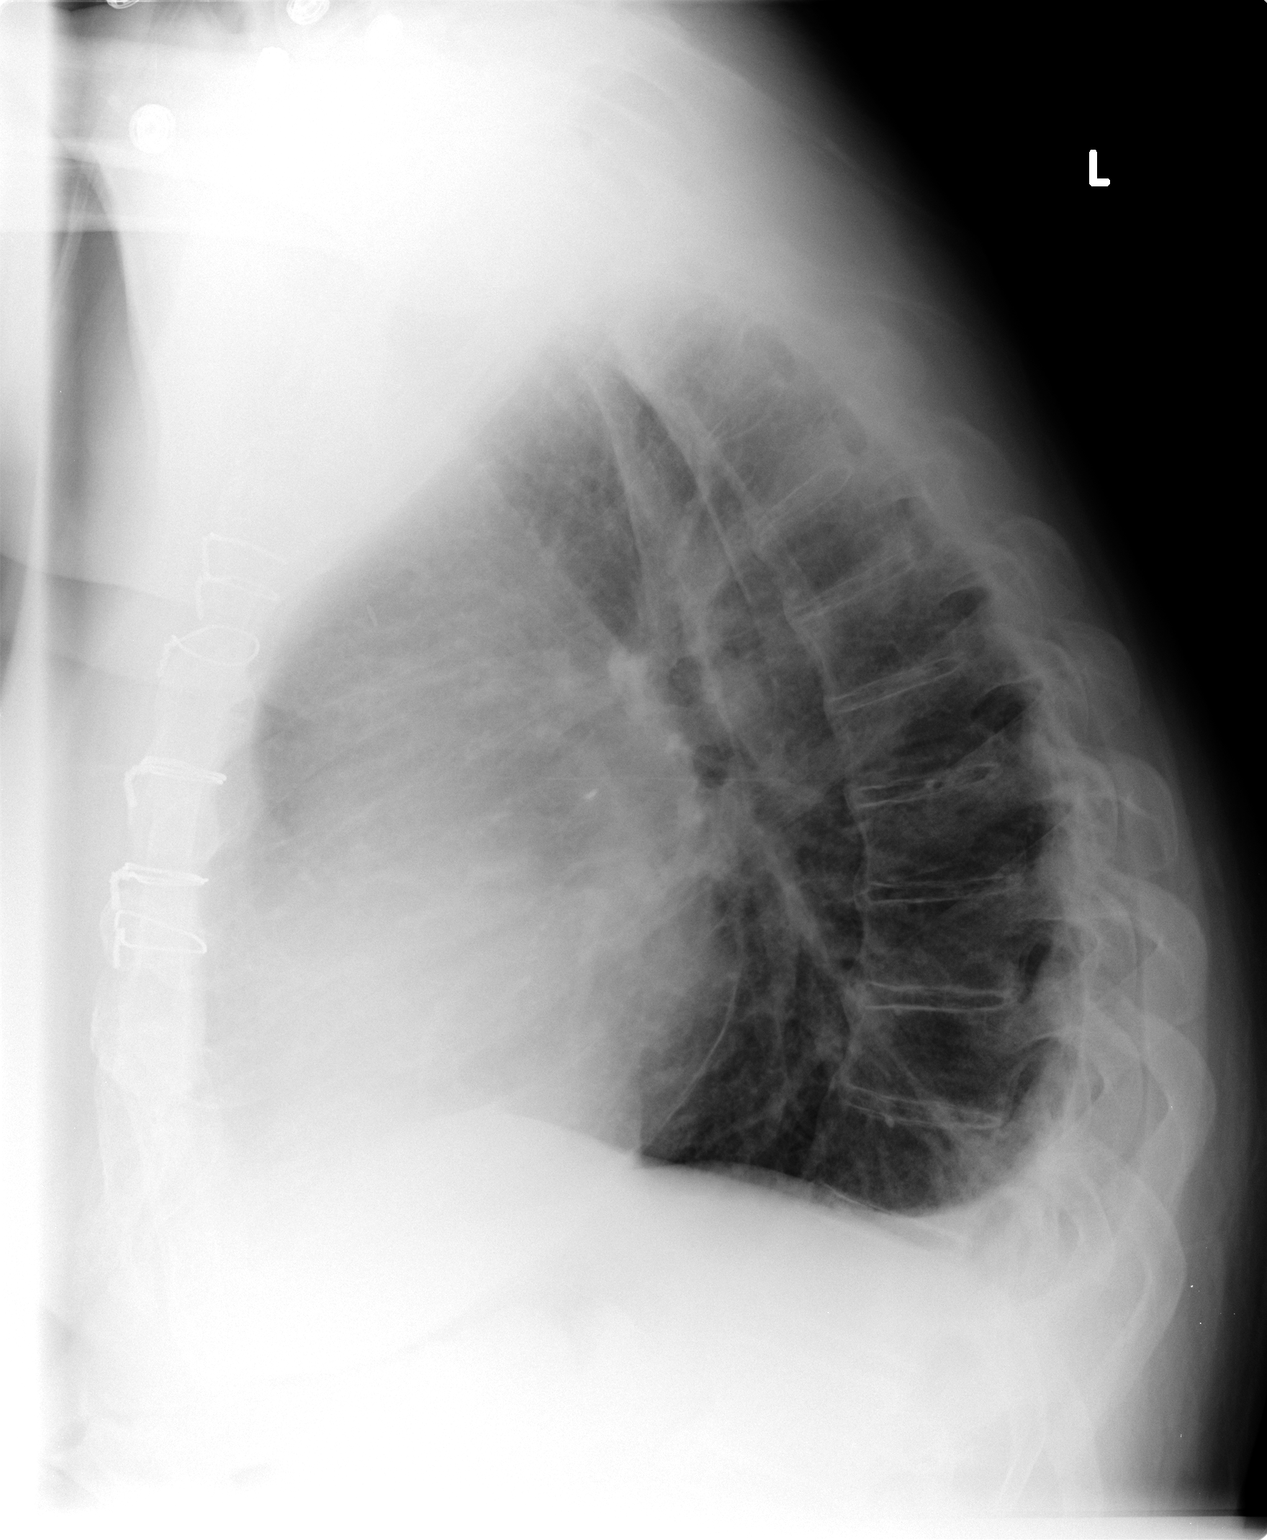

[2 of 2 positions shown; findings below may reference images not displayed]

FINDINGS: The patient is status post median sternotomy CABG
procedure.  There has been interval improvement in pulmonary edema
pattern with improved aeration to both lower lobes.  There are
small pleural effusions present.  No airspace consolidation.
IMPRESSION: 1.  Cardiac enlargement with improving interstitial edema.
2.  Pleural effusions.

## 2014-05-30 ENCOUNTER — Ambulatory Visit (INDEPENDENT_AMBULATORY_CARE_PROVIDER_SITE_OTHER)
Admission: RE | Admit: 2014-05-30 | Discharge: 2014-05-30 | Disposition: A | Discharge status: Home / Self Care | Payer: BC Managed Care – PPO | Source: Ambulatory Visit | Attending: Internal Medicine | Admitting: Internal Medicine

## 2014-05-30 ENCOUNTER — Other Ambulatory Visit (INDEPENDENT_AMBULATORY_CARE_PROVIDER_SITE_OTHER): Payer: BC Managed Care – PPO

## 2014-05-30 ENCOUNTER — Encounter: Payer: Self-pay | Admitting: Internal Medicine

## 2014-05-30 ENCOUNTER — Ambulatory Visit (INDEPENDENT_AMBULATORY_CARE_PROVIDER_SITE_OTHER): Payer: BC Managed Care – PPO | Admitting: Internal Medicine

## 2014-05-30 VITALS — BP 124/80 | HR 75 | Temp 97.9°F | Resp 16 | Ht 69.0 in | Wt 280.0 lb

## 2014-05-30 DIAGNOSIS — E1143 Type 2 diabetes mellitus with diabetic autonomic (poly)neuropathy: Secondary | ICD-10-CM

## 2014-05-30 DIAGNOSIS — E785 Hyperlipidemia, unspecified: Secondary | ICD-10-CM

## 2014-05-30 DIAGNOSIS — N400 Enlarged prostate without lower urinary tract symptoms: Secondary | ICD-10-CM

## 2014-05-30 DIAGNOSIS — R10814 Left lower quadrant abdominal tenderness: Secondary | ICD-10-CM

## 2014-05-30 DIAGNOSIS — F331 Major depressive disorder, recurrent, moderate: Secondary | ICD-10-CM

## 2014-05-30 DIAGNOSIS — I10 Essential (primary) hypertension: Secondary | ICD-10-CM

## 2014-05-30 DIAGNOSIS — E118 Type 2 diabetes mellitus with unspecified complications: Secondary | ICD-10-CM

## 2014-05-30 DIAGNOSIS — K3184 Gastroparesis: Secondary | ICD-10-CM

## 2014-05-30 LAB — PSA: PSA: 0.36 ng/mL (ref 0.10–4.00)

## 2014-05-30 LAB — LIPID PANEL
Cholesterol: 102 mg/dL (ref 0–200)
HDL: 27.9 mg/dL — ABNORMAL LOW (ref >39.00)
LDL Cholesterol: 42 mg/dL (ref 0–99)
NonHDL: 74.1
Total CHOL/HDL Ratio: 4
Triglycerides: 163 mg/dL — ABNORMAL HIGH (ref 0.0–149.0)
VLDL: 32.6 mg/dL (ref 0.0–40.0)

## 2014-05-30 LAB — CBC WITH DIFFERENTIAL/PLATELET
Basophils Absolute: 0 10*3/uL (ref 0.0–0.1)
Basophils Relative: 0.5 % (ref 0.0–3.0)
Eosinophils Absolute: 0.1 10*3/uL (ref 0.0–0.7)
Eosinophils Relative: 1.5 % (ref 0.0–5.0)
HCT: 45 % (ref 39.0–52.0)
Hemoglobin: 14.6 g/dL (ref 13.0–17.0)
Lymphocytes Relative: 14.2 % (ref 12.0–46.0)
Lymphs Abs: 0.9 10*3/uL (ref 0.7–4.0)
MCHC: 32.3 g/dL (ref 30.0–36.0)
MCV: 92.4 fl (ref 78.0–100.0)
Monocytes Absolute: 0.6 10*3/uL (ref 0.1–1.0)
Monocytes Relative: 10.2 % (ref 3.0–12.0)
Neutro Abs: 4.6 10*3/uL (ref 1.4–7.7)
Neutrophils Relative %: 73.6 % (ref 43.0–77.0)
Platelets: 98 10*3/uL — ABNORMAL LOW (ref 150.0–400.0)
RBC: 4.87 Mil/uL (ref 4.22–5.81)
RDW: 13.4 % (ref 11.5–15.5)
WBC: 6.3 10*3/uL (ref 4.0–10.5)

## 2014-05-30 LAB — COMPREHENSIVE METABOLIC PANEL
ALT: 30 U/L (ref 0–53)
AST: 22 U/L (ref 0–37)
Albumin: 4.3 g/dL (ref 3.5–5.2)
Alkaline Phosphatase: 74 U/L (ref 39–117)
BUN: 17 mg/dL (ref 6–23)
CO2: 29 mEq/L (ref 19–32)
Calcium: 9.2 mg/dL (ref 8.4–10.5)
Chloride: 100 mEq/L (ref 96–112)
Creatinine, Ser: 0.9 mg/dL (ref 0.4–1.5)
GFR: 88.15 mL/min (ref >60.00)
Glucose, Bld: 163 mg/dL — ABNORMAL HIGH (ref 70–99)
Potassium: 4.1 mEq/L (ref 3.5–5.1)
Sodium: 141 mEq/L (ref 135–145)
Total Bilirubin: 0.7 mg/dL (ref 0.2–1.2)
Total Protein: 7.1 g/dL (ref 6.0–8.3)

## 2014-05-30 LAB — URINALYSIS, ROUTINE W REFLEX MICROSCOPIC
Bilirubin Urine: NEGATIVE
Hgb urine dipstick: NEGATIVE
Ketones, ur: NEGATIVE
Nitrite: NEGATIVE
RBC / HPF: NONE SEEN (ref 0–?)
Specific Gravity, Urine: 1.01 (ref 1.000–1.030)
Total Protein, Urine: NEGATIVE
Urine Glucose: NEGATIVE
Urobilinogen, UA: 0.2 (ref 0.0–1.0)
pH: 6.5 (ref 5.0–8.0)

## 2014-05-30 LAB — HEMOGLOBIN A1C: Hgb A1c MFr Bld: 10.1 % — ABNORMAL HIGH (ref 4.6–6.5)

## 2014-05-30 LAB — AMYLASE: Amylase: 66 U/L (ref 27–131)

## 2014-05-30 LAB — LIPASE: Lipase: 44 U/L (ref 11.0–59.0)

## 2014-05-30 LAB — SEDIMENTATION RATE: Sed Rate: 14 mm/hr (ref 0–22)

## 2014-05-30 LAB — TSH: TSH: 0.94 u[IU]/mL (ref 0.35–4.50)

## 2014-05-30 MED ORDER — CLONAZEPAM 0.5 MG PO TABS: 0.5000 mg | ORAL_TABLET | Freq: Two times a day (BID) | ORAL | Status: DC | PRN

## 2014-05-30 NOTE — Progress Notes (Signed)
Subjective:    Patient ID: Cody Hale, male    DOB: 1950/11/04, 63 y.o.   MRN: [id number]  Abdominal Pain This is a new problem. The current episode started 1 to 4 weeks ago. The onset quality is gradual. The problem occurs intermittently. The most recent episode lasted 3 weeks. The problem has been unchanged. The pain is located in the LLQ. The pain is at a severity of 3/10. The pain is mild. The quality of the pain is aching. The abdominal pain does not radiate. Associated symptoms include anorexia, diarrhea (1-2 watery BM's per day) and nausea. Pertinent negatives include no arthralgias, belching, constipation, dysuria, fever, flatus, frequency, headaches, hematochezia, hematuria, melena, myalgias, vomiting or weight loss. Nothing aggravates the pain. The pain is relieved by nothing. He has tried nothing for the symptoms. The treatment provided mild relief. His past medical history is significant for colon cancer. There is no history of abdominal surgery, Crohn's disease, gallstones, GERD, irritable bowel syndrome, pancreatitis, PUD or ulcerative colitis.      Review of Systems  Constitutional: Negative for fever and weight loss.  Gastrointestinal: Positive for nausea, abdominal pain, diarrhea (1-2 watery BM's per day) and anorexia. Negative for vomiting, constipation, melena, hematochezia and flatus.  Genitourinary: Negative for dysuria, frequency and hematuria.  Musculoskeletal: Negative for myalgias and arthralgias.  Neurological: Negative for headaches.       Objective:   Physical Exam  Constitutional: He is oriented to person, place, and time. He appears well-developed and well-nourished.  Non-toxic appearance. He does not have a sickly appearance. He does not appear ill. No distress.  HENT:  Head: Normocephalic and atraumatic.  Mouth/Throat: Oropharynx is clear and moist. No oropharyngeal exudate.  Eyes: Conjunctivae are normal. Right eye exhibits no discharge. Left eye  exhibits no discharge. No scleral icterus.  Neck: Normal range of motion. Neck supple. No JVD present. No tracheal deviation present. No thyromegaly present.  Cardiovascular: Normal rate, regular rhythm, normal heart sounds and intact distal pulses.  Exam reveals no gallop and no friction rub.   No murmur heard. Pulmonary/Chest: Effort normal and breath sounds normal. No stridor. No respiratory distress. He has no wheezes. He has no rales. He exhibits no tenderness.  Abdominal: Soft. Normal appearance, normal aorta and bowel sounds are normal. He exhibits no shifting dullness, no distension, no pulsatile liver, no fluid wave, no abdominal bruit, no ascites, no pulsatile midline mass and no mass. There is no hepatosplenomegaly or splenomegaly. There is tenderness in the left lower quadrant. There is no rigidity, no rebound, no guarding, no CVA tenderness, no tenderness at McBurney's point and negative Murphy's sign. No hernia. Hernia confirmed negative in the ventral area, confirmed negative in the right inguinal area and confirmed negative in the left inguinal area.  Genitourinary: Rectum normal, testes normal and penis normal. Rectal exam shows no external hemorrhoid, no internal hemorrhoid, no fissure, no mass, no tenderness and anal tone normal. Guaiac negative stool. Prostate is enlarged (1+ smooth symm BPH). Prostate is not tender. Right testis shows no mass, no swelling and no tenderness. Right testis is descended. Left testis shows no mass, no swelling and no tenderness. Left testis is descended. Circumcised. No penile erythema or penile tenderness. No discharge found.  Musculoskeletal: Normal range of motion. He exhibits no edema or tenderness.  Lymphadenopathy:    He has no cervical adenopathy.       Right: No inguinal adenopathy present.       Left: No inguinal adenopathy present.  Neurological: He is oriented to person, place, and time.  Skin: Skin is warm and dry. No rash noted. He is not  diaphoretic. No erythema. No pallor.  Vitals reviewed.    Lab Results  Component Value Date   WBC 5.0 01/31/2014   HGB 14.7 01/31/2014   HCT 43.0 01/31/2014   PLT 101.0 Repeated and verified X2.* 01/31/2014   GLUCOSE 219* 01/31/2014   CHOL 157 01/31/2014   TRIG 356.0* 01/31/2014   HDL 29.50* 01/31/2014   LDLDIRECT 84.2 01/31/2014   LDLCALC 72 03/19/2013   ALT 30 01/31/2014   AST 22 01/31/2014   NA 140 01/31/2014   K 4.0 01/31/2014   CL 101 01/31/2014   CREATININE 1.0 01/31/2014   BUN 13 01/31/2014   CO2 31 01/31/2014   TSH 0.81 01/31/2014   INR 1.13 11/30/2012   HGBA1C 8.7* 01/31/2014       Assessment & Plan:

## 2014-05-30 NOTE — Patient Instructions (Signed)

## 2014-05-30 NOTE — Progress Notes (Signed)
Pre visit review using our clinic review tool, if applicable. No additional management support is needed unless otherwise documented below in the visit note. 

## 2014-06-01 NOTE — Assessment & Plan Note (Signed)
His BP is well controlled Lytes and renal function are stable 

## 2014-06-01 NOTE — Assessment & Plan Note (Signed)
The exam, labs, plain xray are all normal I think he has had viral gastroenteritis or a flare of his diabetic gastroparesis Will follow for now

## 2014-06-01 NOTE — Assessment & Plan Note (Signed)
Improvement noted in his trigs Will follow for now

## 2014-06-07 ENCOUNTER — Ambulatory Visit (INDEPENDENT_AMBULATORY_CARE_PROVIDER_SITE_OTHER): Payer: BC Managed Care – PPO | Admitting: *Deleted

## 2014-06-07 DIAGNOSIS — I4891 Unspecified atrial fibrillation: Secondary | ICD-10-CM

## 2014-06-07 IMAGING — CR DG CHEST 2V
2 series · 2 of 2 positions shown · non-contrast
Comparison: 12/02/2012

CLINICAL DATA: Medical clearance

CHEST - 2 VIEW

[view not recorded (1 of 2)]
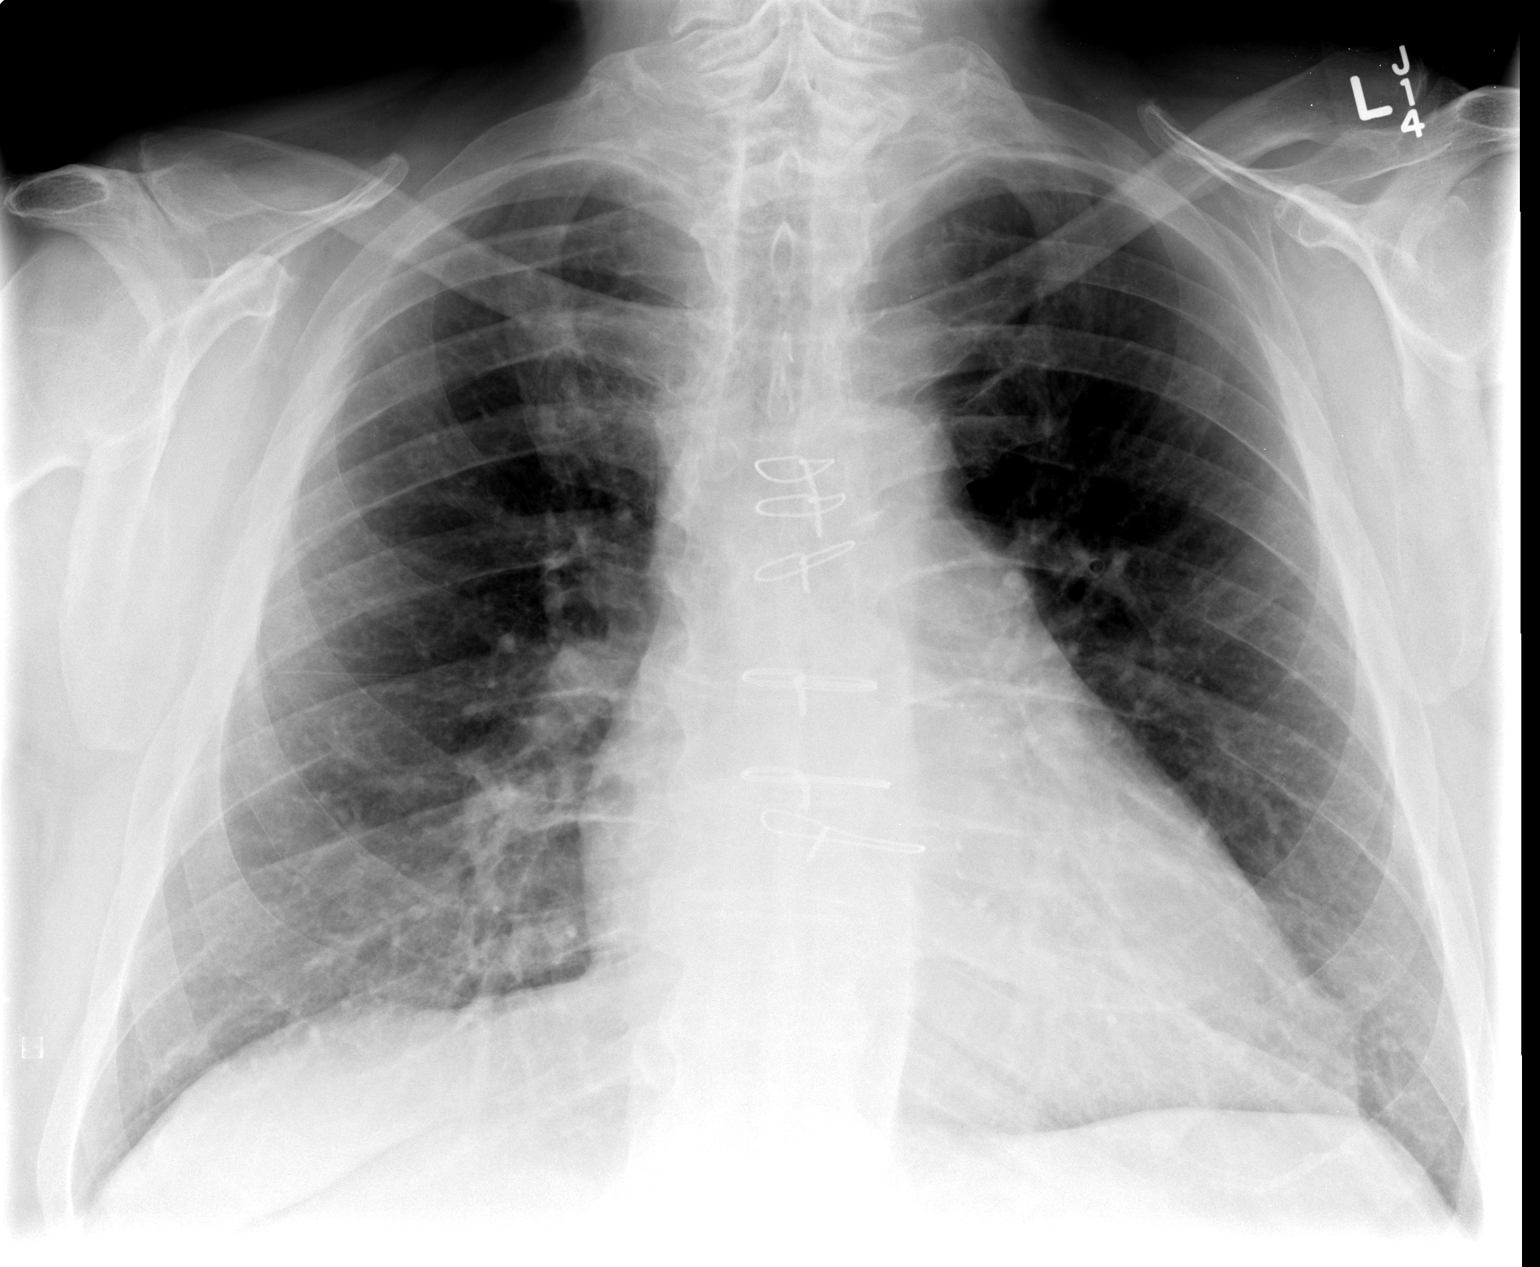

[view not recorded (2 of 2)]
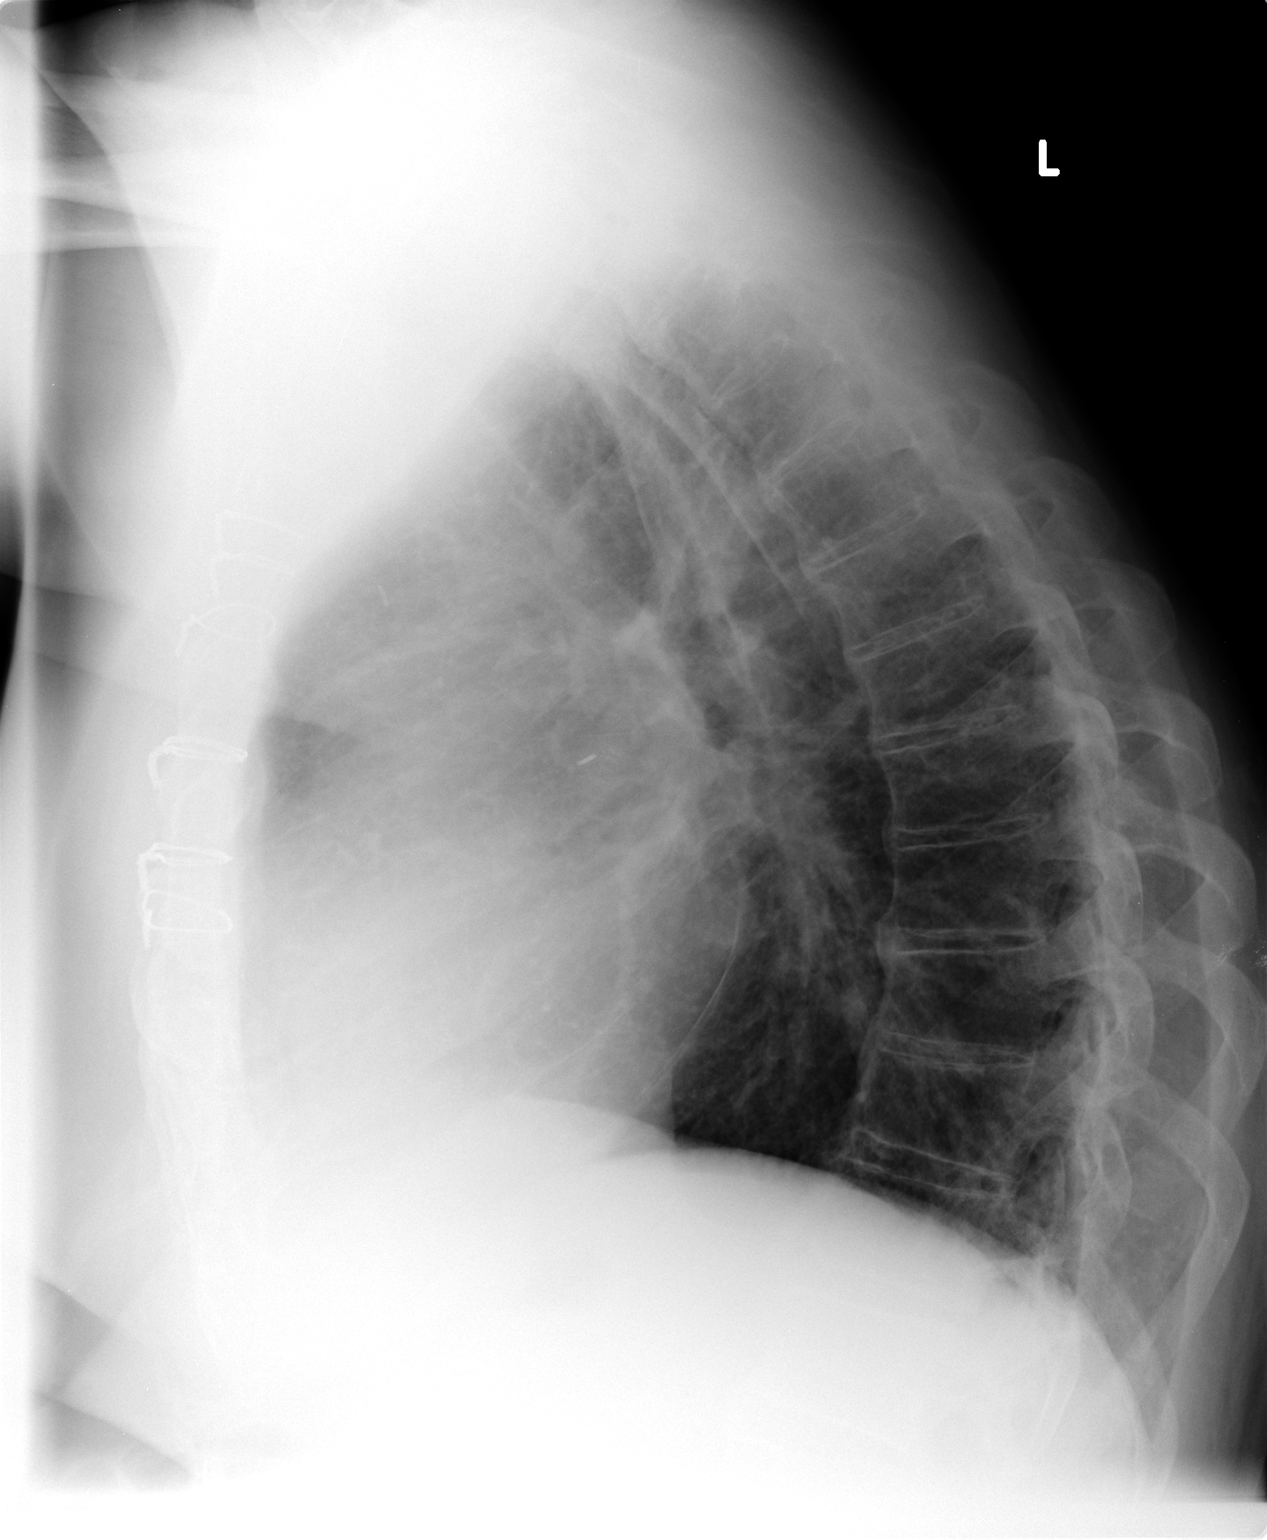

[2 of 2 positions shown; findings below may reference images not displayed]

FINDINGS: Cardiac enlargement.  Negative for heart failure.
Resolution of bilateral pleural effusions since the prior study.
Negative for infiltrate effusion or mass.
IMPRESSION: Cardiac enlargement.  No acute abnormality.

## 2014-06-07 NOTE — Patient Instructions (Signed)
Pt was started on Xarelto 20mg  qhs for atrial fib on 05/06/14 by Dr Purvis Sheffield.    Reviewed patients medication list.  Pt is not currently on any combined P-gp and strong CYP3A4 inhibitors/inducers (ketoconazole, traconazole, ritonavir, carbamazepine, phenytoin, rifampin, St. John's wort).  Reviewed labs from 05/30/14:  SCr 0.9, Weight 280, CrCl- 150.92.  Dose is appropriate based on CrCl.   Hgb and HCT: 14.6/45.0   A full discussion of the nature of anticoagulants has been carried out.  A benefit/risk analysis has been presented to the patient, so that they understand the justification for choosing anticoagulation with Xarelto at this time.  The need for compliance is stressed.  Pt is aware to take the medication once daily with the largest meal of the day.  Side effects of potential bleeding are discussed, including unusual colored urine or stools, coughing up blood or coffee ground emesis, nose bleeds or serious fall or head trauma.  Discussed signs and symptoms of stroke. The patient should avoid any OTC items containing aspirin or ibuprofen.  Avoid alcohol consumption.   Call if any signs of abnormal bleeding.  Discussed financial obligations and resolved any difficulty in obtaining medication.  Next lab test test in 6 months. Appt placed in recall.

## 2014-06-16 ENCOUNTER — Encounter (HOSPITAL_COMMUNITY): Payer: Self-pay | Admitting: Cardiovascular Disease

## 2014-06-21 ENCOUNTER — Encounter: Payer: Self-pay | Admitting: Internal Medicine

## 2014-06-21 ENCOUNTER — Ambulatory Visit (INDEPENDENT_AMBULATORY_CARE_PROVIDER_SITE_OTHER): Payer: BC Managed Care – PPO | Admitting: Internal Medicine

## 2014-06-21 VITALS — BP 128/78 | HR 73 | Temp 98.3°F | Ht 69.0 in | Wt 287.0 lb

## 2014-06-21 DIAGNOSIS — M1611 Unilateral primary osteoarthritis, right hip: Secondary | ICD-10-CM

## 2014-06-21 DIAGNOSIS — E118 Type 2 diabetes mellitus with unspecified complications: Secondary | ICD-10-CM

## 2014-06-21 DIAGNOSIS — I10 Essential (primary) hypertension: Secondary | ICD-10-CM

## 2014-06-21 MED ORDER — GLUCOSE BLOOD VI STRP: ORAL_STRIP | Status: DC

## 2014-06-21 MED ORDER — ONETOUCH VERIO IQ SYSTEM W/DEVICE KIT: 1.0000 | PACK | Freq: Three times a day (TID) | Status: DC

## 2014-06-21 MED ORDER — INSULIN GLARGINE 300 UNIT/ML ~~LOC~~ SOPN: 30.0000 [IU] | PEN_INJECTOR | Freq: Every day | SUBCUTANEOUS | Status: DC

## 2014-06-21 MED ORDER — CANAGLIFLOZIN 300 MG PO TABS: 300.0000 mg | ORAL_TABLET | Freq: Every day | ORAL | Status: DC

## 2014-06-21 NOTE — Assessment & Plan Note (Signed)
He complains that there is pain and instability in the right hip and thigh area, I asked him to see sports med for further evaluation

## 2014-06-21 NOTE — Assessment & Plan Note (Signed)
His A1C = 10.0 Will cont janumet but will also add basal insulin and invokana a well for better blood sugar control

## 2014-06-21 NOTE — Progress Notes (Signed)
Subjective:    Patient ID: Cody Hale, male    DOB: 1950-08-16, 63 y.o.   MRN: [id number]  Diabetes He presents for his follow-up diabetic visit. He has type 2 diabetes mellitus. His disease course has been worsening. There are no hypoglycemic associated symptoms. Associated symptoms include polydipsia, polyphagia and polyuria. Pertinent negatives for diabetes include no blurred vision, no chest pain, no fatigue, no foot paresthesias, no foot ulcerations, no visual change, no weakness and no weight loss. There are no hypoglycemic complications. Symptoms are worsening. Diabetic complications include heart disease. Current diabetic treatment includes oral agent (dual therapy). He is compliant with treatment most of the time. His weight is stable. He is following a generally unhealthy diet. He never participates in exercise. There is no change in his home blood glucose trend. An ACE inhibitor/angiotensin II receptor blocker is being taken. He does not see a podiatrist.Eye exam is not current.      Review of Systems  Constitutional: Positive for unexpected weight change. Negative for fever, chills, weight loss, diaphoresis, appetite change and fatigue.  HENT: Negative.   Eyes: Negative.  Negative for blurred vision.  Respiratory: Negative.  Negative for cough, choking, chest tightness, shortness of breath and stridor.   Cardiovascular: Negative.  Negative for chest pain, palpitations and leg swelling.  Gastrointestinal: Negative.  Negative for nausea, vomiting, abdominal pain, diarrhea, constipation and blood in stool.  Endocrine: Positive for polydipsia, polyphagia and polyuria.  Genitourinary: Negative.   Musculoskeletal: Positive for arthralgias (rt hip and thigh pain). Negative for myalgias, back pain, gait problem and neck pain.  Skin: Negative.   Allergic/Immunologic: Negative.   Neurological: Negative.  Negative for weakness.  Hematological: Negative.  Negative for adenopathy. Does  not bruise/bleed easily.  Psychiatric/Behavioral: Negative.        Objective:   Physical Exam  Constitutional: He is oriented to person, place, and time. He appears well-developed and well-nourished. No distress.  HENT:  Head: Normocephalic and atraumatic.  Mouth/Throat: Oropharynx is clear and moist. No oropharyngeal exudate.  Eyes: Conjunctivae are normal. Right eye exhibits no discharge. Left eye exhibits no discharge. No scleral icterus.  Neck: Normal range of motion. Neck supple. No JVD present. No tracheal deviation present. No thyromegaly present.  Cardiovascular: Normal rate, regular rhythm and intact distal pulses.  Exam reveals no gallop and no friction rub.   No murmur heard. Pulmonary/Chest: Effort normal and breath sounds normal. No stridor. No respiratory distress. He has no wheezes. He has no rales. He exhibits no tenderness.  Abdominal: Soft. Bowel sounds are normal. He exhibits no distension and no mass. There is no tenderness. There is no rebound and no guarding.  Musculoskeletal: Normal range of motion. He exhibits edema (trace edema in BLE). He exhibits no tenderness.  Lymphadenopathy:    He has no cervical adenopathy.  Neurological: He is oriented to person, place, and time.  Skin: Skin is warm and dry. No rash noted. He is not diaphoretic. No erythema. No pallor.  Vitals reviewed.     Lab Results  Component Value Date   WBC 6.3 05/30/2014   HGB 14.6 05/30/2014   HCT 45.0 05/30/2014   PLT 98.0 Repeated and verified X2.* 05/30/2014   GLUCOSE 163* 05/30/2014   CHOL 102 05/30/2014   TRIG 163.0* 05/30/2014   HDL 27.90* 05/30/2014   LDLDIRECT 84.2 01/31/2014   LDLCALC 42 05/30/2014   ALT 30 05/30/2014   AST 22 05/30/2014   NA 141 05/30/2014   K 4.1 05/30/2014  CL 100 05/30/2014   CREATININE 0.9 05/30/2014   BUN 17 05/30/2014   CO2 29 05/30/2014   TSH 0.94 05/30/2014   PSA 0.36 05/30/2014   INR 1.13 11/30/2012   HGBA1C 10.1* 05/30/2014        Assessment & Plan:

## 2014-06-21 NOTE — Patient Instructions (Signed)

## 2014-06-21 NOTE — Assessment & Plan Note (Signed)
His BP is well controlled Lytes and renal function are stable 

## 2014-06-28 ENCOUNTER — Telehealth: Payer: Self-pay | Admitting: Internal Medicine

## 2014-06-28 MED ORDER — ONETOUCH ULTRASOFT LANCETS MISC: Status: DC

## 2014-06-28 MED ORDER — GLUCOSE BLOOD VI STRP: ORAL_STRIP | Status: DC

## 2014-06-28 NOTE — Telephone Encounter (Signed)
Called pt to verify insulin. Wife states she did not say lantus he is needing refill on his testing strips & lancets for the one touch verio Q. Inform wife will send to [facility]...Raechel Chute

## 2014-06-28 NOTE — Telephone Encounter (Signed)
Pt wife called in and needs refills on Lantus and his strips

## 2014-07-20 ENCOUNTER — Ambulatory Visit: Payer: Self-pay | Admitting: Family Medicine

## 2014-07-31 IMAGING — CR DG CHEST 1V PORT
1 series · 1 of 1 positions shown · non-contrast
Comparison: 12/24/2012

CLINICAL DATA: Cough, hypotension

PORTABLE CHEST - 1 VIEW

[portable]
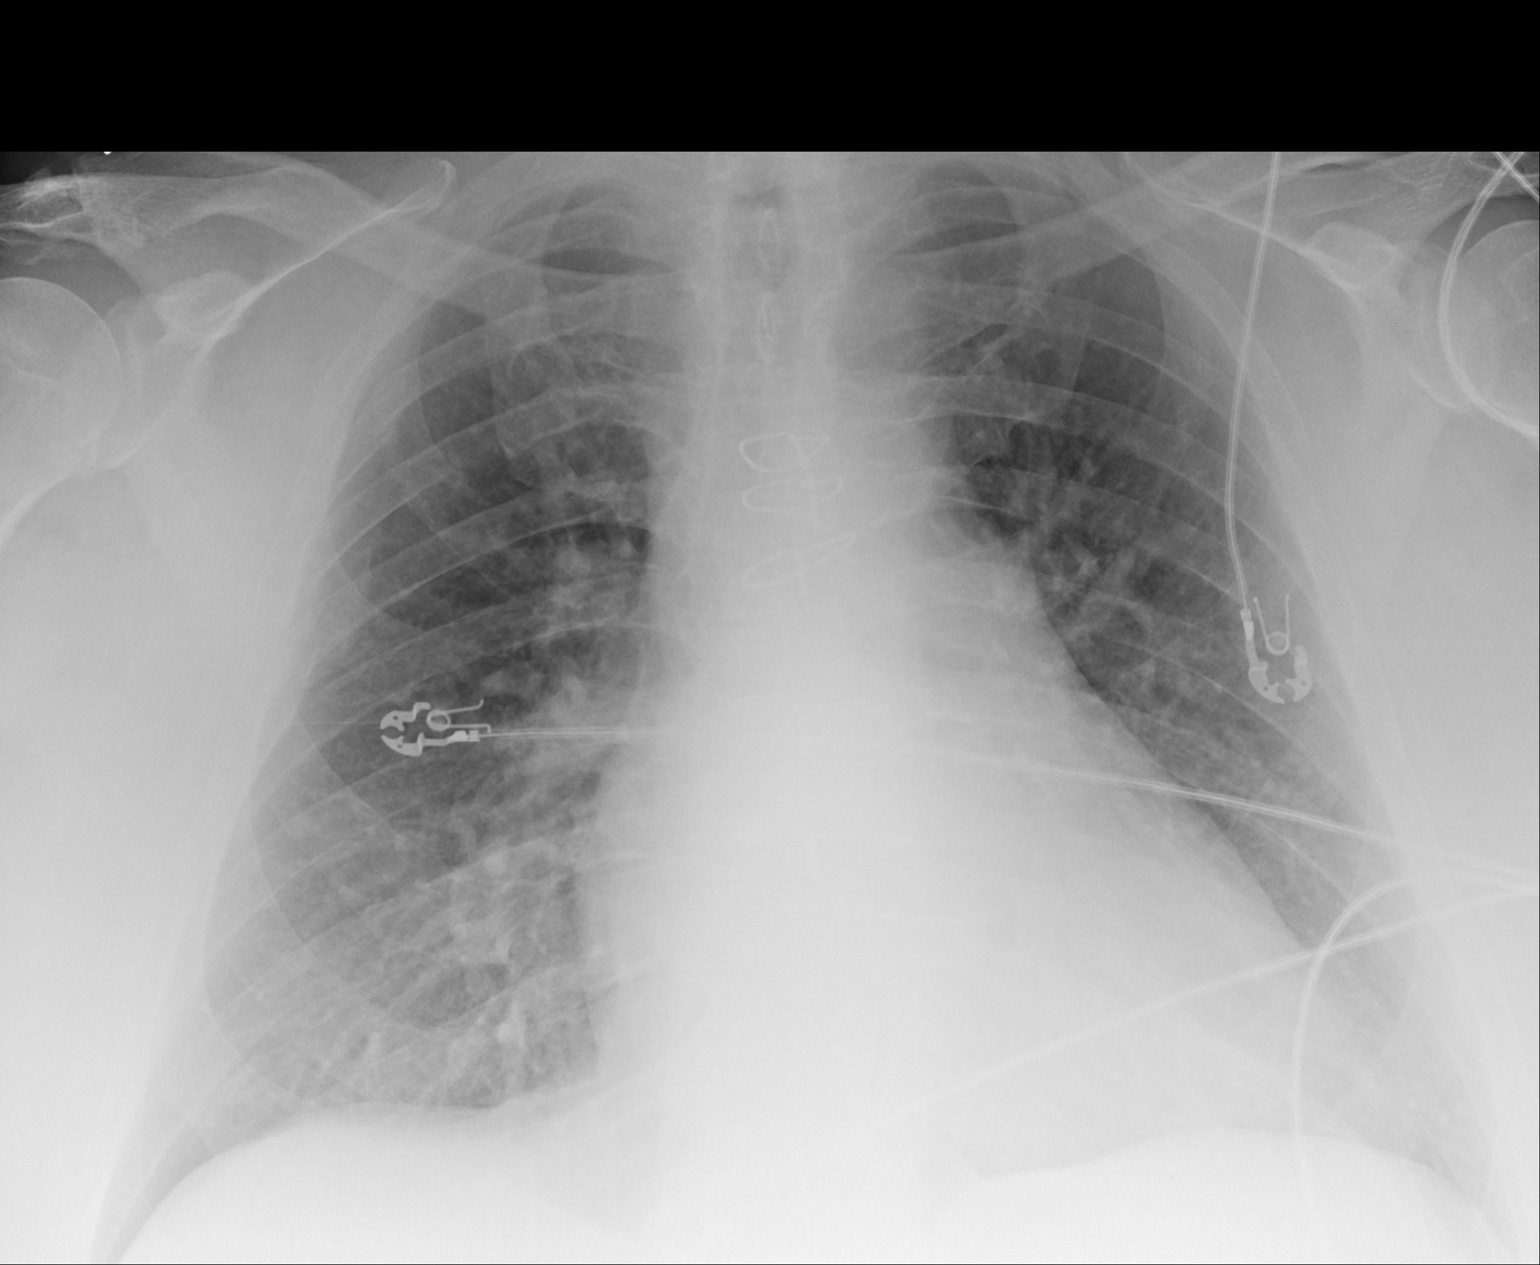

[1 of 1 positions shown; findings below may reference images not displayed]

FINDINGS: Cardiac enlargement with vascular congestion.  Negative
for edema or effusion.  Negative for pneumonia.
IMPRESSION: Cardiac enlargement with mild vascular congestion

## 2014-08-01 IMAGING — CR DG CHEST 1V PORT
1 series · 1 of 1 positions shown · non-contrast
Comparison: Portable chest x-ray of 02/16/2013

CLINICAL DATA: Hypotension

PORTABLE CHEST - 1 VIEW

[portable]
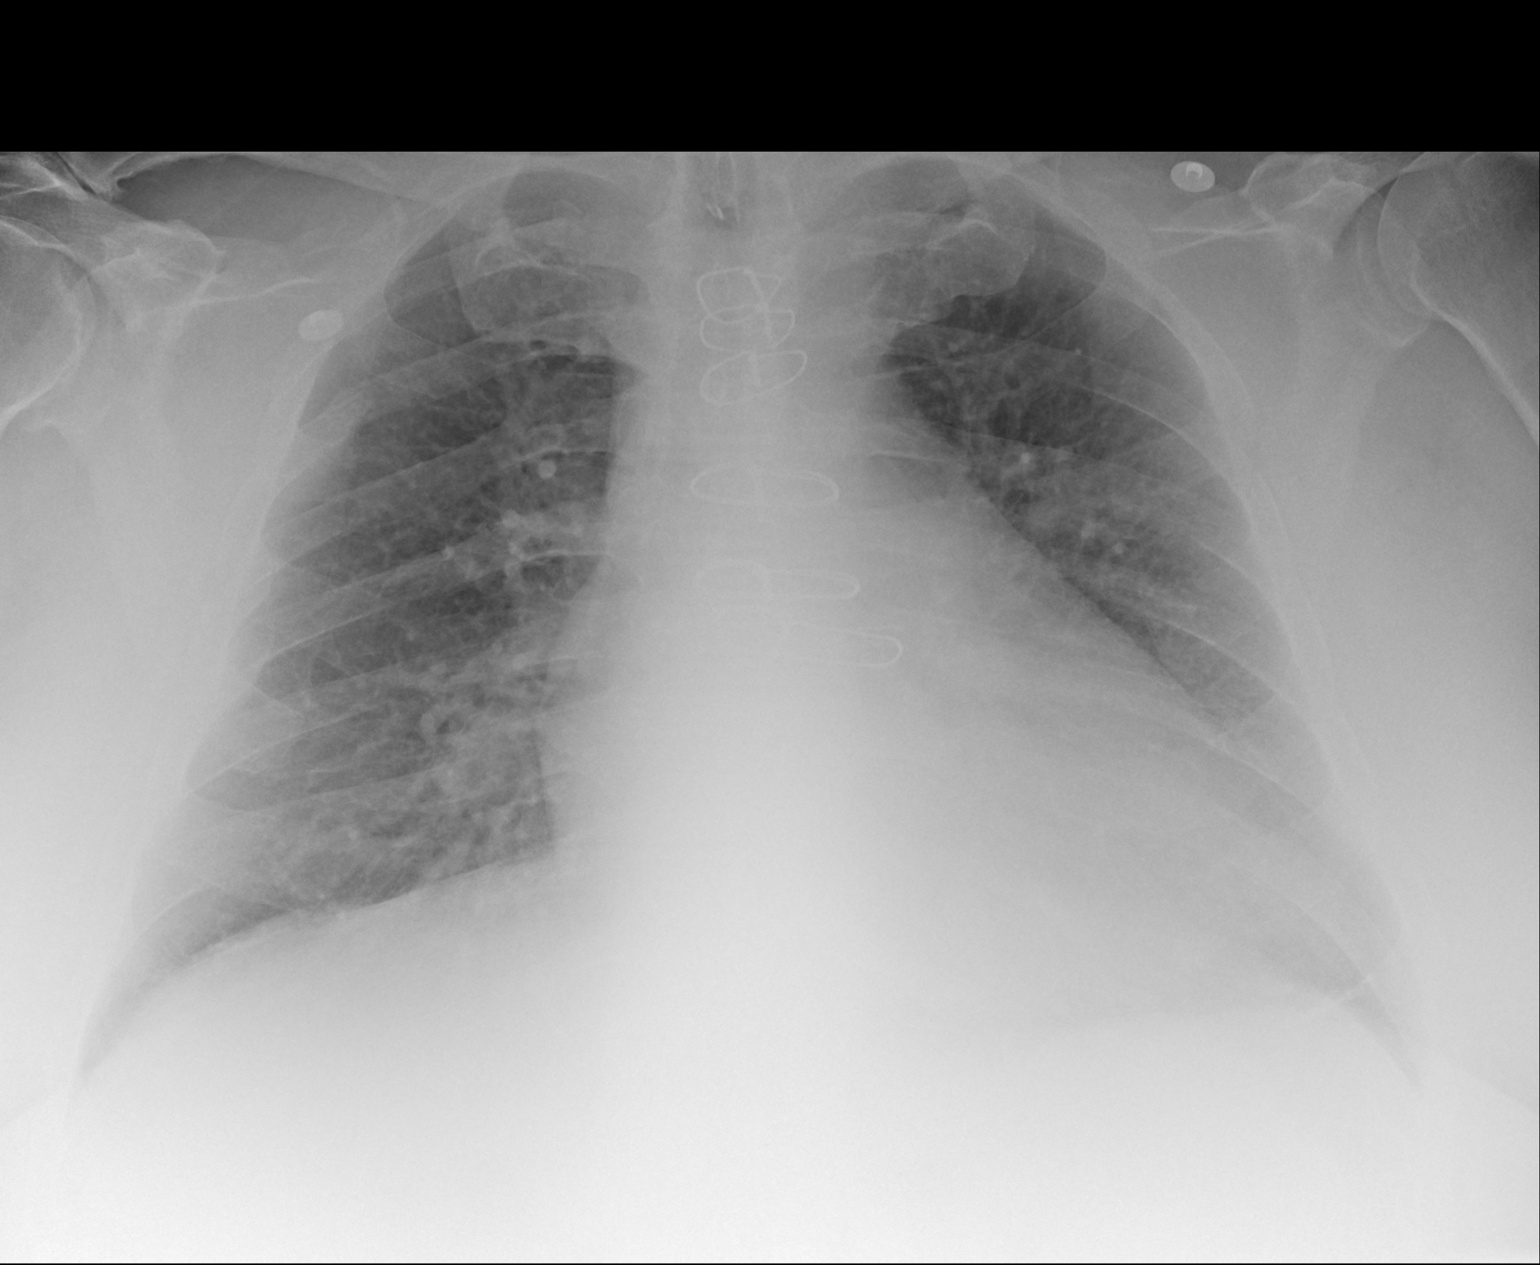

[1 of 1 positions shown; findings below may reference images not displayed]

FINDINGS: The lungs are not quite as well aerated.  Moderate
cardiomegaly is stable.  There may be minimal pulmonary vascular
congestion remaining.  No bony abnormality is seen.
IMPRESSION: Stable cardiomegaly.  Slightly diminished aeration.  Question
minimal pulmonary vascular congestion.

## 2014-08-03 ENCOUNTER — Telehealth: Payer: Self-pay | Admitting: *Deleted

## 2014-08-03 NOTE — Telephone Encounter (Signed)
Pt given 4 bottles of xarelto 20 mg Lot # [id number] 922 exp. 6/18

## 2014-08-04 ENCOUNTER — Ambulatory Visit: Payer: Self-pay | Admitting: Cardiovascular Disease

## 2014-08-10 ENCOUNTER — Ambulatory Visit: Payer: Self-pay | Admitting: Family Medicine

## 2014-08-20 IMAGING — CR DG CHEST 1V PORT
1 series · 1 of 1 positions shown · non-contrast
Comparison: Prior radiograph from 02/17/2013

CLINICAL DATA: Shortness of breath

PORTABLE CHEST - 1 VIEW

[portable]
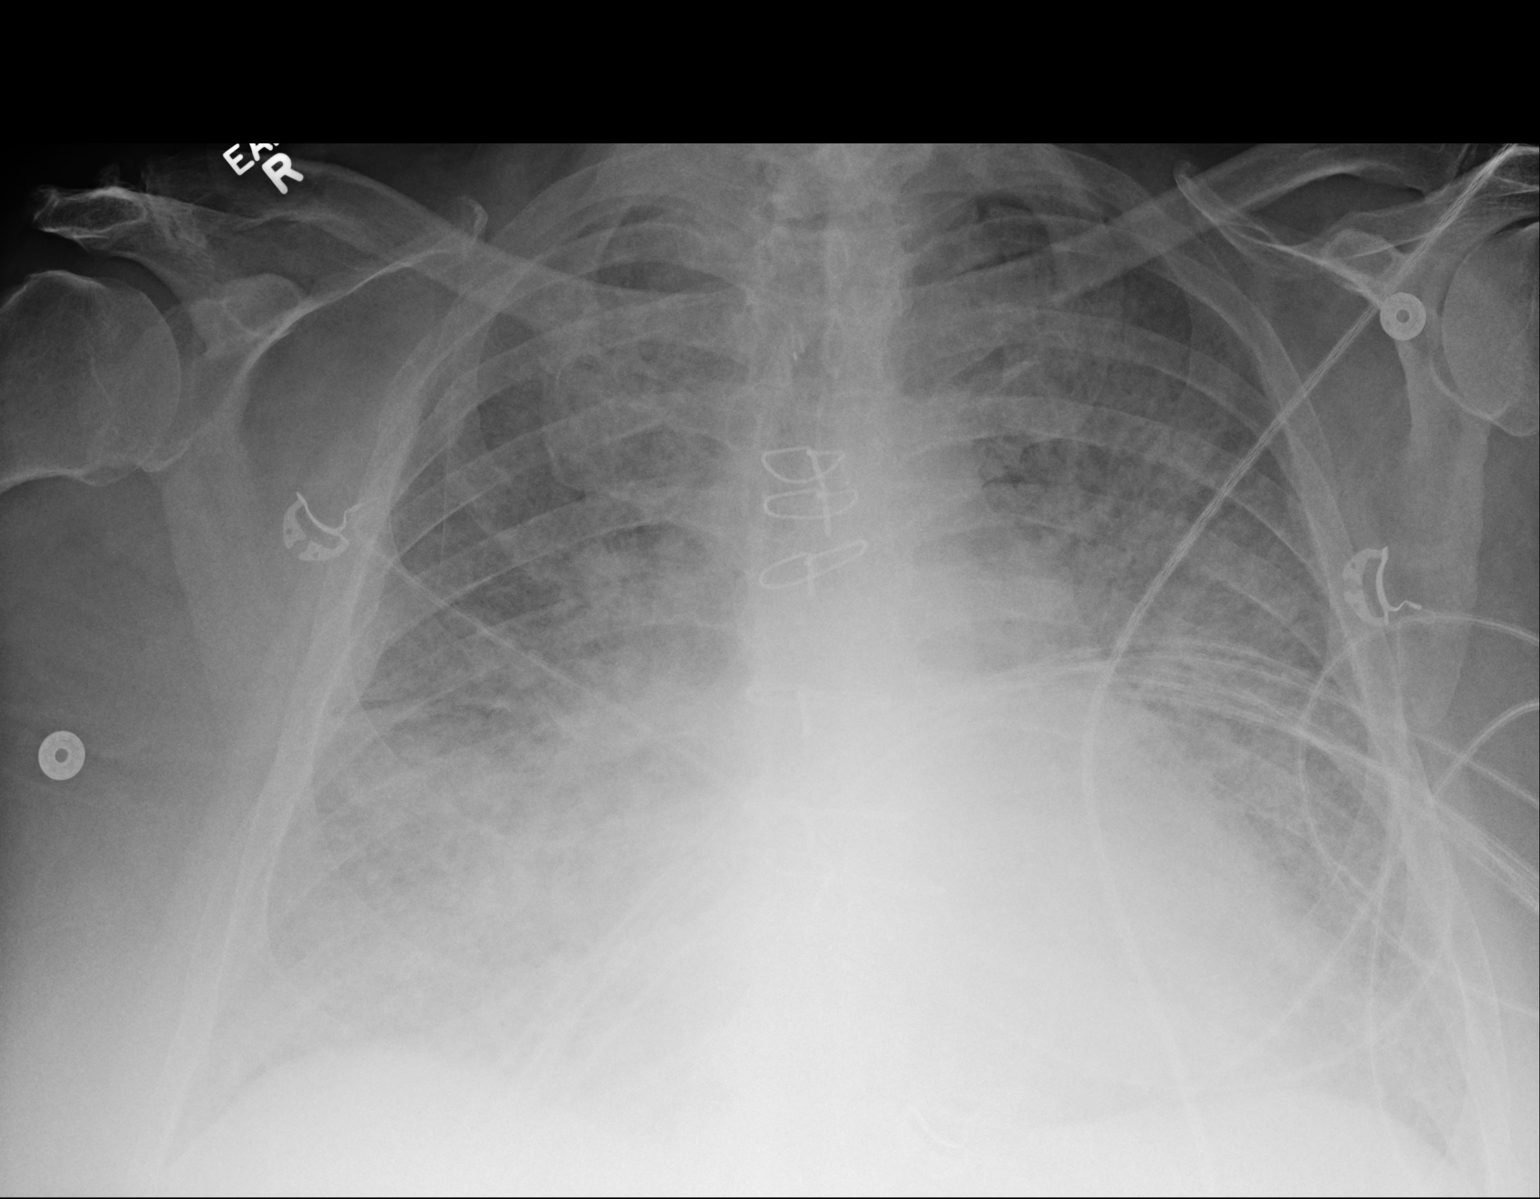

[1 of 1 positions shown; findings below may reference images not displayed]

FINDINGS: Cardiomegaly is grossly stable as compared to prior
radiation.  Median sternotomy wires are again noted.

The lungs are mildly hypoinflated.  There is been interval
development of florid pulmonary edema seen throughout the lungs.
There are likely small bilateral pleural effusions.  No
pneumothorax.  No definite superimposed infiltrate is identified.

Osseous structures are unchanged.
IMPRESSION: Cardiomegaly with florid pulmonary edema.

## 2014-08-22 ENCOUNTER — Ambulatory Visit: Payer: Self-pay | Admitting: Cardiovascular Disease

## 2014-08-22 IMAGING — CR DG CHEST 1V PORT
1 series · 1 of 1 positions shown · non-contrast
Comparison: 03/08/2013.

CLINICAL DATA: Congestive heart failure.

PORTABLE CHEST - 1 VIEW

[portable]
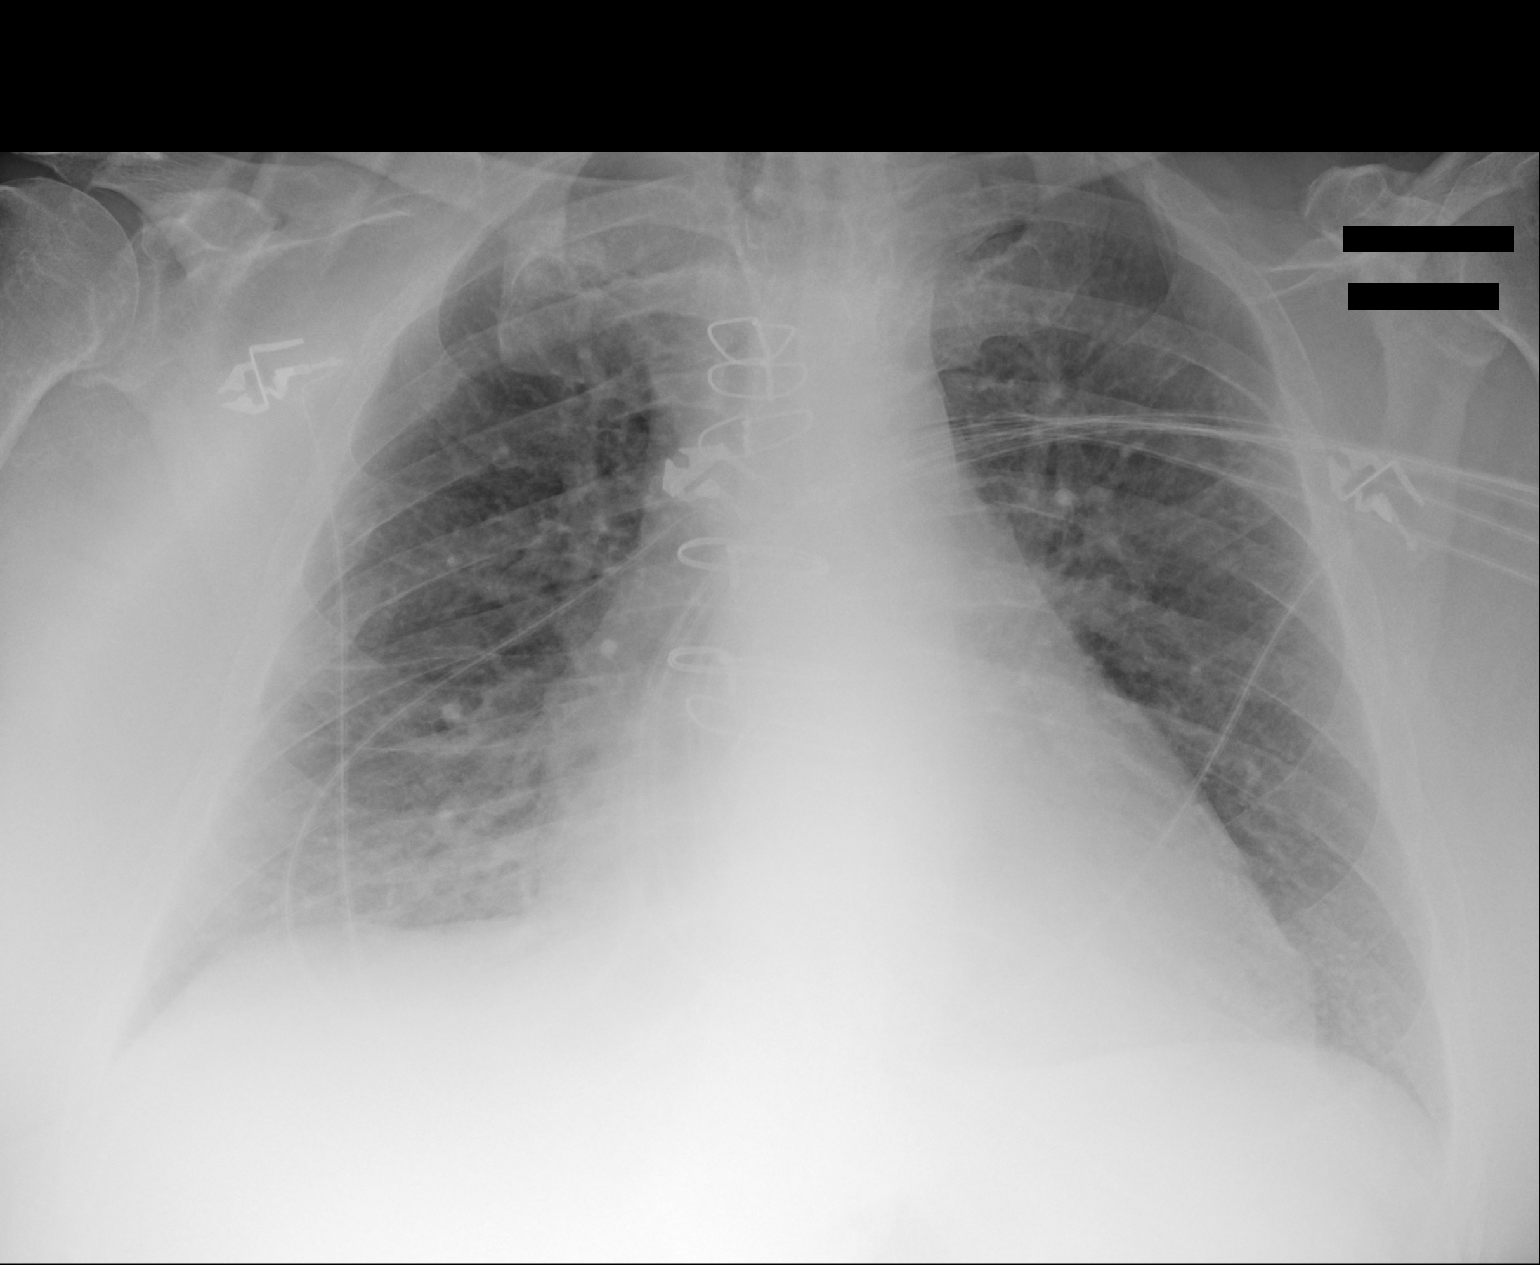

[1 of 1 positions shown; findings below may reference images not displayed]

FINDINGS: Patient is slightly rotated.  Heart size stable.  Mild
diffuse interstitial prominence and indistinctness.  No definite
pleural fluid.  Degenerative changes are seen in the
acromioclavicular joints.
IMPRESSION: Mild edema, improved from 03/08/2013.

## 2014-09-09 ENCOUNTER — Other Ambulatory Visit: Payer: Self-pay | Admitting: Internal Medicine

## 2014-09-13 ENCOUNTER — Encounter: Payer: Self-pay | Admitting: Cardiovascular Disease

## 2014-09-13 ENCOUNTER — Ambulatory Visit (INDEPENDENT_AMBULATORY_CARE_PROVIDER_SITE_OTHER): Payer: 59 | Admitting: Cardiovascular Disease

## 2014-09-13 VITALS — BP 110/70 | HR 69 | Ht 70.0 in | Wt 276.0 lb

## 2014-09-13 DIAGNOSIS — E119 Type 2 diabetes mellitus without complications: Secondary | ICD-10-CM

## 2014-09-13 DIAGNOSIS — I429 Cardiomyopathy, unspecified: Secondary | ICD-10-CM

## 2014-09-13 DIAGNOSIS — I482 Chronic atrial fibrillation, unspecified: Secondary | ICD-10-CM

## 2014-09-13 DIAGNOSIS — I10 Essential (primary) hypertension: Secondary | ICD-10-CM

## 2014-09-13 DIAGNOSIS — E785 Hyperlipidemia, unspecified: Secondary | ICD-10-CM

## 2014-09-13 DIAGNOSIS — I447 Left bundle-branch block, unspecified: Secondary | ICD-10-CM

## 2014-09-13 DIAGNOSIS — I5022 Chronic systolic (congestive) heart failure: Secondary | ICD-10-CM

## 2014-09-13 NOTE — Patient Instructions (Signed)
Your physician has requested that you have an echocardiogram. Echocardiography is a painless test that uses sound waves to create images of your heart. It provides your doctor with information about the size and shape of your heart and how well your heart's chambers and valves are working. This procedure takes approximately one hour. There are no restrictions for this procedure. Office will contact with results via phone or letter.   Continue all current medications. Follow up in  4 months.   
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
## 2014-09-29 ENCOUNTER — Other Ambulatory Visit (INDEPENDENT_AMBULATORY_CARE_PROVIDER_SITE_OTHER): Payer: 59

## 2014-09-29 DIAGNOSIS — I5022 Chronic systolic (congestive) heart failure: Secondary | ICD-10-CM | POA: Diagnosis not present

## 2014-09-29 DIAGNOSIS — I482 Chronic atrial fibrillation: Secondary | ICD-10-CM

## 2014-09-29 DIAGNOSIS — I429 Cardiomyopathy, unspecified: Secondary | ICD-10-CM | POA: Diagnosis not present

## 2014-09-29 DIAGNOSIS — I340 Nonrheumatic mitral (valve) insufficiency: Secondary | ICD-10-CM

## 2014-10-03 ENCOUNTER — Telehealth: Payer: Self-pay | Admitting: *Deleted

## 2014-10-03 ENCOUNTER — Telehealth: Payer: Self-pay

## 2014-10-03 DIAGNOSIS — I214 Non-ST elevation (NSTEMI) myocardial infarction: Secondary | ICD-10-CM

## 2014-10-03 DIAGNOSIS — Z952 Presence of prosthetic heart valve: Secondary | ICD-10-CM

## 2014-10-03 DIAGNOSIS — I429 Cardiomyopathy, unspecified: Secondary | ICD-10-CM

## 2014-10-03 NOTE — Telephone Encounter (Signed)
Returned your call.

## 2014-10-03 NOTE — Telephone Encounter (Signed)
-----   Message from Laqueta Linden, MD sent at 09/30/2014  9:27 AM EDT ----- Needs a repeat study with contrast, but shouldn't be charged for it. A limited study to evaluate biventricular function and accurate assessment of LVEF.

## 2014-10-03 NOTE — Telephone Encounter (Signed)
done

## 2014-10-03 NOTE — Telephone Encounter (Signed)
Patient stated that per his cardiologist, a new referral is need from PCP.

## 2014-10-03 NOTE — Telephone Encounter (Signed)
Notes Recorded by Lesle Chris, LPN on 5/75/0518 at 10:11 AM Left message to return call.

## 2014-10-04 NOTE — Telephone Encounter (Signed)
Notes Recorded by Lesle Chris, LPN on 7/37/3668 at 6:02 PM Patient notified. He is in agreement to do test. Will put order in & forward to [facility]) for scheduling.

## 2014-10-10 ENCOUNTER — Ambulatory Visit (HOSPITAL_COMMUNITY)
Admission: RE | Admit: 2014-10-10 | Discharge: 2014-10-10 | Disposition: A | Discharge status: Home / Self Care | Payer: 59 | Source: Ambulatory Visit | Attending: Cardiovascular Disease | Admitting: Cardiovascular Disease

## 2014-10-10 DIAGNOSIS — I429 Cardiomyopathy, unspecified: Secondary | ICD-10-CM | POA: Insufficient documentation

## 2014-10-10 MED ORDER — PERFLUTREN LIPID MICROSPHERE
1.0000 mL | INTRAVENOUS | Status: AC | PRN
  Administered 2014-10-10 (×2): 1 mL via INTRAVENOUS
  Administered 2014-10-10: 2 mL via INTRAVENOUS
  Administered 2014-10-10: 1 mL via INTRAVENOUS
  Filled 2014-10-10: qty 10

## 2014-10-10 NOTE — Progress Notes (Signed)
  Echocardiogram 2D Echocardiogram with Definity has been performed.  Cody Hale 10/10/2014, 12:57 PM

## 2014-10-11 ENCOUNTER — Telehealth: Payer: Self-pay | Admitting: *Deleted

## 2014-10-11 NOTE — Telephone Encounter (Signed)
Notes Recorded by Lesle Chris, LPN on 0/08/4095 at 8:16 AM Patient notified and verbalized understanding.

## 2014-10-11 NOTE — Telephone Encounter (Signed)
-----   Message from Laqueta Linden, MD sent at 10/10/2014  1:39 PM EDT ----- Heart function improved to 45-50%. No need for CRT-D.

## 2014-10-21 ENCOUNTER — Ambulatory Visit: Payer: Self-pay | Admitting: Internal Medicine

## 2014-10-21 ENCOUNTER — Other Ambulatory Visit: Payer: Self-pay | Admitting: Internal Medicine

## 2014-10-24 ENCOUNTER — Ambulatory Visit: Payer: Self-pay | Admitting: Internal Medicine

## 2014-11-02 ENCOUNTER — Ambulatory Visit: Payer: Self-pay | Admitting: Cardiovascular Disease

## 2014-11-15 IMAGING — CR DG CHEST 1V PORT
1 series · 1 of 1 positions shown · non-contrast
Comparison: 03/10/2013

CLINICAL DATA: Cough and shortness of breath for 1 week. History of
mitral valve repair and congestive heart failure.

EXAM:
PORTABLE CHEST - 1 VIEW

[portable]
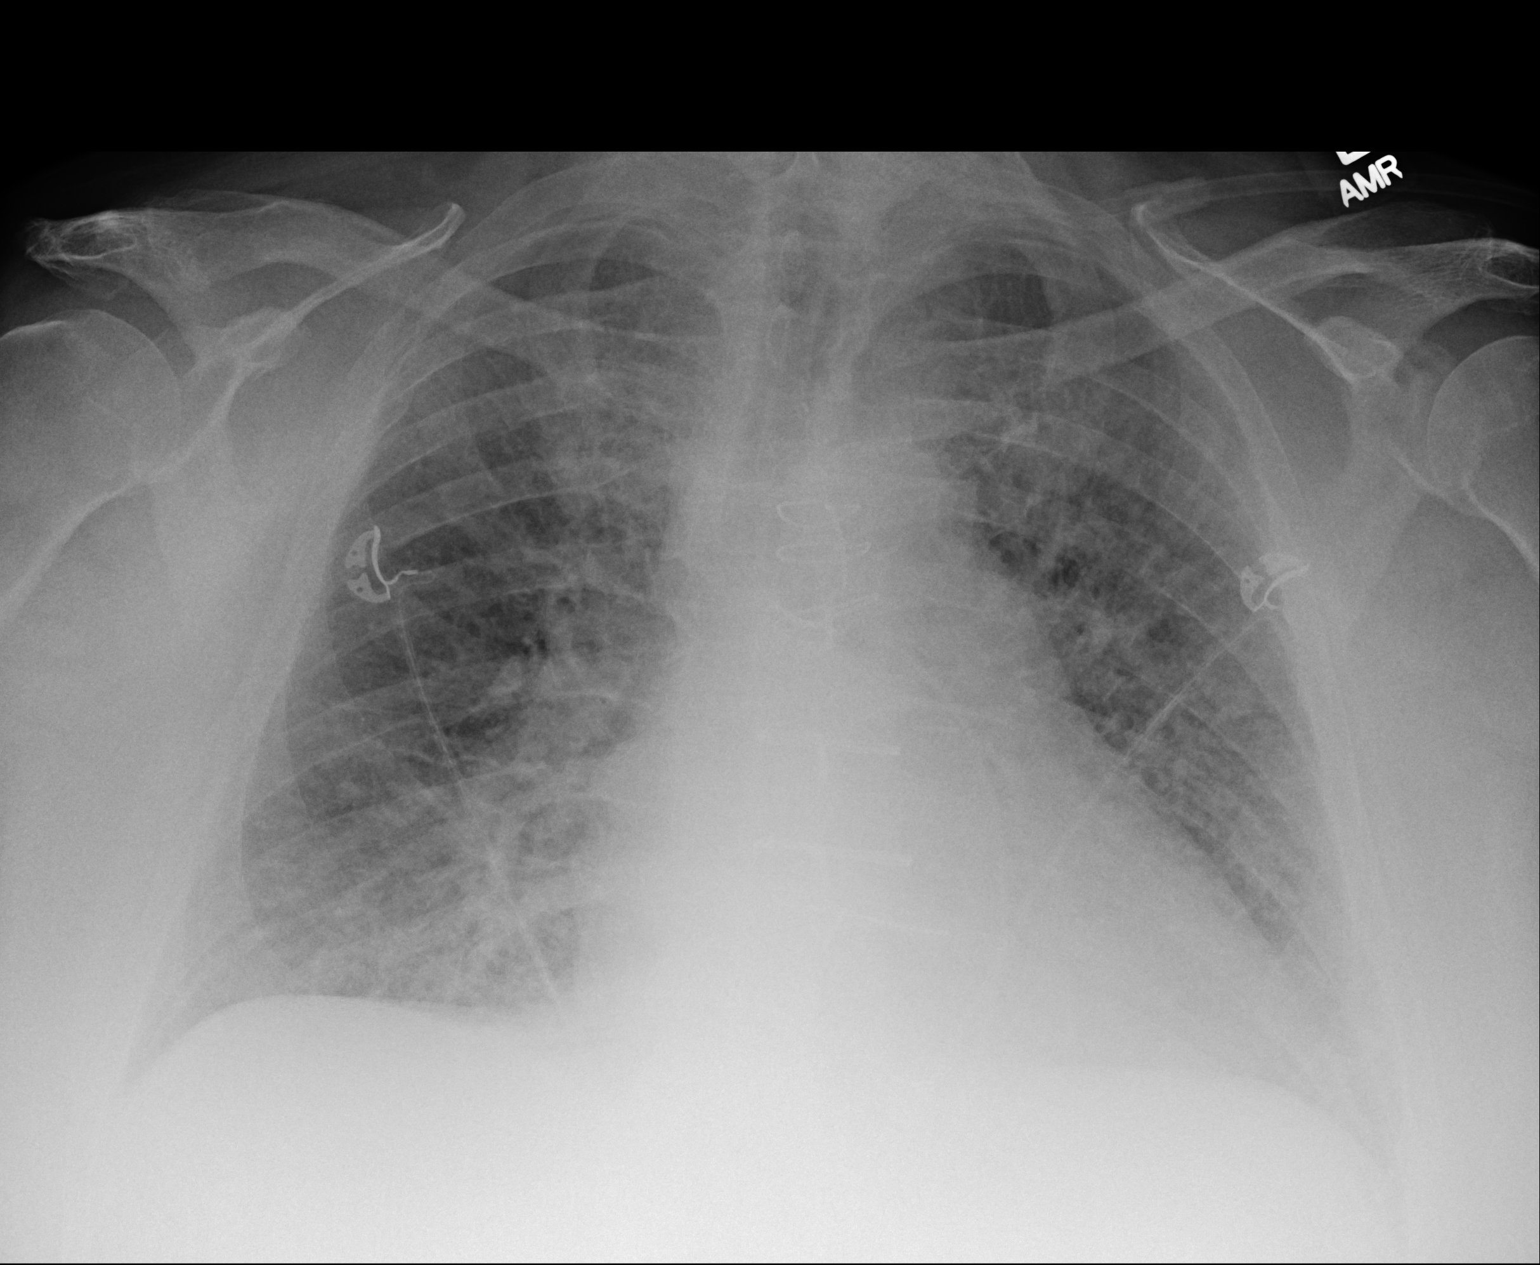

[1 of 1 positions shown; findings below may reference images not displayed]

FINDINGS: Midline trachea. Cardiomegaly accentuated by AP portable technique.
No definite pleural fluid. No pneumothorax. Remote right rib trauma.
Mild to moderate interstitial edema. No lobar consolidation.
IMPRESSION: Mild to moderate congestive heart failure.

## 2014-11-22 ENCOUNTER — Encounter: Payer: Self-pay | Admitting: Internal Medicine

## 2014-11-22 ENCOUNTER — Other Ambulatory Visit (INDEPENDENT_AMBULATORY_CARE_PROVIDER_SITE_OTHER): Payer: Medicaid Other

## 2014-11-22 ENCOUNTER — Ambulatory Visit (INDEPENDENT_AMBULATORY_CARE_PROVIDER_SITE_OTHER): Payer: Medicaid Other | Admitting: Internal Medicine

## 2014-11-22 VITALS — BP 102/64 | HR 77 | Temp 98.5°F | Resp 16 | Ht 70.0 in | Wt 276.0 lb

## 2014-11-22 DIAGNOSIS — I10 Essential (primary) hypertension: Secondary | ICD-10-CM

## 2014-11-22 DIAGNOSIS — F329 Major depressive disorder, single episode, unspecified: Secondary | ICD-10-CM | POA: Diagnosis not present

## 2014-11-22 DIAGNOSIS — E118 Type 2 diabetes mellitus with unspecified complications: Secondary | ICD-10-CM

## 2014-11-22 DIAGNOSIS — F450 Somatization disorder: Secondary | ICD-10-CM

## 2014-11-22 DIAGNOSIS — D519 Vitamin B12 deficiency anemia, unspecified: Secondary | ICD-10-CM

## 2014-11-22 DIAGNOSIS — F32A Depression, unspecified: Secondary | ICD-10-CM

## 2014-11-22 DIAGNOSIS — M170 Bilateral primary osteoarthritis of knee: Secondary | ICD-10-CM

## 2014-11-22 DIAGNOSIS — E785 Hyperlipidemia, unspecified: Secondary | ICD-10-CM

## 2014-11-22 LAB — URINALYSIS, ROUTINE W REFLEX MICROSCOPIC
Bilirubin Urine: NEGATIVE
Hgb urine dipstick: NEGATIVE
Ketones, ur: NEGATIVE
Leukocytes, UA: NEGATIVE
Nitrite: NEGATIVE
RBC / HPF: NONE SEEN (ref 0–?)
Specific Gravity, Urine: 1.01 (ref 1.000–1.030)
Total Protein, Urine: NEGATIVE
Urine Glucose: >=1000 — AB
Urobilinogen, UA: 0.2 (ref 0.0–1.0)
WBC, UA: NONE SEEN (ref 0–?)
pH: 7 (ref 5.0–8.0)

## 2014-11-22 LAB — CBC WITH DIFFERENTIAL/PLATELET
Basophils Absolute: 0 10*3/uL (ref 0.0–0.1)
Basophils Relative: 0.5 % (ref 0.0–3.0)
Eosinophils Absolute: 0.1 10*3/uL (ref 0.0–0.7)
Eosinophils Relative: 2.1 % (ref 0.0–5.0)
HCT: 44.5 % (ref 39.0–52.0)
Hemoglobin: 14.7 g/dL (ref 13.0–17.0)
Lymphocytes Relative: 18 % (ref 12.0–46.0)
Lymphs Abs: 1 10*3/uL (ref 0.7–4.0)
MCHC: 33 g/dL (ref 30.0–36.0)
MCV: 91.6 fl (ref 78.0–100.0)
Monocytes Absolute: 0.6 10*3/uL (ref 0.1–1.0)
Monocytes Relative: 10.4 % (ref 3.0–12.0)
Neutro Abs: 3.7 10*3/uL (ref 1.4–7.7)
Neutrophils Relative %: 69 % (ref 43.0–77.0)
Platelets: 113 10*3/uL — ABNORMAL LOW (ref 150.0–400.0)
RBC: 4.85 Mil/uL (ref 4.22–5.81)
RDW: 14.1 % (ref 11.5–15.5)
WBC: 5.4 10*3/uL (ref 4.0–10.5)

## 2014-11-22 LAB — MICROALBUMIN / CREATININE URINE RATIO
Creatinine,U: 68.3 mg/dL
Microalb Creat Ratio: 1 mg/g (ref 0.0–30.0)
Microalb, Ur: 0.7 mg/dL (ref 0.0–1.9)

## 2014-11-22 LAB — BASIC METABOLIC PANEL
BUN: 18 mg/dL (ref 6–23)
CO2: 29 mEq/L (ref 19–32)
Calcium: 9.8 mg/dL (ref 8.4–10.5)
Chloride: 106 mEq/L (ref 96–112)
Creatinine, Ser: 0.95 mg/dL (ref 0.40–1.50)
GFR: 84.81 mL/min (ref >60.00)
Glucose, Bld: 127 mg/dL — ABNORMAL HIGH (ref 70–99)
Potassium: 4.4 mEq/L (ref 3.5–5.1)
Sodium: 139 mEq/L (ref 135–145)

## 2014-11-22 LAB — HEMOGLOBIN A1C: Hgb A1c MFr Bld: 7.3 % — ABNORMAL HIGH (ref 4.6–6.5)

## 2014-11-22 LAB — TSH: TSH: 0.55 u[IU]/mL (ref 0.35–4.50)

## 2014-11-22 MED ORDER — HYDROCODONE-ACETAMINOPHEN 10-325 MG PO TABS: 1.0000 | ORAL_TABLET | Freq: Three times a day (TID) | ORAL | Status: DC | PRN

## 2014-11-22 MED ORDER — VILAZODONE HCL 10 & 20 & 40 MG PO KIT: 1.0000 | PACK | Freq: Every day | ORAL | Status: DC

## 2014-11-22 NOTE — Progress Notes (Signed)
Subjective:  Patient ID: Cody Hale, male    DOB: 04/24/1951  Age: 64 y.o. MRN: [id number]  CC: Hyperlipidemia; Diabetes; Depression; and Osteoarthritis   HPI Cody Hale presents for follow but also complains of depression with anhedonia, crying spells, irritability, and sadness. He complains of bilateral severe knee pain for several months and there is a lot of pain at night while trying to sleep, he has tried his wife's supply of norco for the pain and that has helped a lot. He complains that lipitor is too expensive.  Outpatient Prescriptions Prior to Visit  Medication Sig Dispense Refill  . albuterol (PROVENTIL) (5 MG/ML) 0.5% nebulizer solution Take 0.5 mL (2.5 mg total) by nebulization every 2 (two) hours as needed for wheezing. 20 mL 12  . Blood Glucose Monitoring Suppl (ONETOUCH VERIO IQ SYSTEM) W/DEVICE KIT 1 Act by Does not apply route 3 (three) times daily. 2 kit 0  . canagliflozin (INVOKANA) 300 MG TABS tablet Take 300 mg by mouth daily before breakfast. 90 tablet 3  . carvedilol (COREG) 3.125 MG tablet TAKE ONE TABLET BY MOUTH TWICE DAILY WITH FOOD 60 tablet 5  . carvedilol (COREG) 6.25 MG tablet Take 1 tablet (6.25 mg total) by mouth 2 (two) times daily with a meal. 60 tablet 6  . furosemide (LASIX) 40 MG tablet TAKE ONE TABLET BY MOUTH TWICE DAILY 60 tablet 5  . glucose blood (ONETOUCH VERIO) test strip Use to check blood sugars twice a day Dx E11.8 100 each 3  . glucose blood test strip Use TID 100 each 12  . Insulin Glargine (TOUJEO SOLOSTAR) 300 UNIT/ML SOPN Inject 30 Units into the skin daily. 1.5 mL 11  . Lancets (ONETOUCH ULTRASOFT) lancets Use to help check blood sugars twice a day E11.8 100 each 3  . losartan (COZAAR) 100 MG tablet TAKE ONE TABLET BY MOUTH ONCE DAILY 90 tablet 3  . Multiple Vitamin (MULTIVITAMIN WITH MINERALS) TABS Take 1 tablet by mouth daily.    . nitroGLYCERIN (NITROSTAT) 0.4 MG SL tablet Place 1 tablet (0.4 mg total) under the tongue  every 5 (five) minutes x 3 doses as needed for chest pain. 25 tablet 3  . rivaroxaban (XARELTO) 20 MG TABS tablet Take 1 tablet (20 mg total) by mouth daily with supper. 20 tablet 0  . sitaGLIPtin-metformin (JANUMET) 50-1000 MG per tablet Take 1 tablet by mouth 2 (two) times daily with a meal. 180 tablet 1  . Umeclidinium Bromide (INCRUSE ELLIPTA) 62.5 MCG/INH AEPB Inhale 1 puff into the lungs daily. 30 each 11  . atorvastatin (LIPITOR) 40 MG tablet Take 1 tablet (40 mg total) by mouth daily. (Patient not taking: Reported on 11/22/2014) 90 tablet 3  . clonazePAM (KLONOPIN) 0.5 MG tablet Take 1 tablet (0.5 mg total) by mouth 2 (two) times daily as needed for anxiety. (Patient not taking: Reported on 11/22/2014) 60 tablet 5  . cyanocobalamin 2000 MCG tablet Take 1 tablet (2,000 mcg total) by mouth daily. (Patient not taking: Reported on 11/22/2014) 90 tablet 3  . traMADol (ULTRAM) 50 MG tablet TAKE ONE TABLET BY MOUTH EVERY 8 HOURS AS NEEDED FOR PAIN (Patient not taking: Reported on 11/22/2014) 65 tablet 3   No facility-administered medications prior to visit.    ROS Review of Systems  Constitutional: Negative.  Negative for fever, chills, diaphoresis, appetite change and fatigue.  HENT: Negative.   Eyes: Negative.   Respiratory: Negative.  Negative for cough, choking, chest tightness, shortness of breath and stridor.  Cardiovascular: Negative.  Negative for chest pain, palpitations and leg swelling.  Gastrointestinal: Negative.  Negative for nausea, vomiting, abdominal pain, diarrhea, constipation and blood in stool.  Endocrine: Negative.   Genitourinary: Negative.  Negative for difficulty urinating.  Musculoskeletal: Positive for arthralgias. Negative for myalgias, back pain, joint swelling, gait problem, neck pain and neck stiffness.  Skin: Negative.  Negative for rash.  Allergic/Immunologic: Negative.   Neurological: Negative.  Negative for dizziness, weakness and numbness.  Hematological:  Negative.  Negative for adenopathy. Does not bruise/bleed easily.  Psychiatric/Behavioral: Positive for dysphoric mood and decreased concentration. Negative for suicidal ideas, hallucinations, behavioral problems, confusion, sleep disturbance, self-injury and agitation. The patient is not nervous/anxious and is not hyperactive.     Objective:  BP 102/64 mmHg  Pulse 77  Temp(Src) 98.5 F (36.9 C) (Oral)  Resp 16  Ht _0  (1.778 m)  Wt 276 lb (125.193 kg)  BMI 39.60 kg/m2  SpO2 96%  BP Readings from Last 3 Encounters:  11/22/14 102/64  10/10/14 127/63  09/13/14 110/70    Wt Readings from Last 3 Encounters:  11/22/14 276 lb (125.193 kg)  09/13/14 276 lb (125.193 kg)  06/21/14 287 lb (130.182 kg)    Physical Exam  Constitutional: He is oriented to person, place, and time. He appears well-developed and well-nourished. No distress.  HENT:  Head: Normocephalic and atraumatic.  Mouth/Throat: Oropharynx is clear and moist. No oropharyngeal exudate.  Eyes: Conjunctivae are normal. Right eye exhibits no discharge. Left eye exhibits no discharge. No scleral icterus.  Neck: Normal range of motion. Neck supple. No JVD present. No tracheal deviation present. No thyromegaly present.  Cardiovascular: Normal rate, regular rhythm, normal heart sounds and intact distal pulses.  Exam reveals no gallop.   No murmur heard. Pulmonary/Chest: Effort normal and breath sounds normal. No stridor. No respiratory distress. He has no wheezes. He has no rales. He exhibits no tenderness.  Abdominal: Soft. Bowel sounds are normal. He exhibits no distension and no mass. There is no tenderness. There is no rebound and no guarding.  Musculoskeletal: Normal range of motion. He exhibits edema (1+ edema in BLE). He exhibits no tenderness.       Right knee: He exhibits deformity (+DJD). He exhibits normal range of motion, no swelling, no effusion, no ecchymosis, no laceration, no erythema, normal alignment, no LCL  laxity and normal patellar mobility. No tenderness found.       Left knee: He exhibits deformity (+DJD). He exhibits normal range of motion, no swelling, no effusion, no ecchymosis, no laceration, no erythema, normal alignment, no LCL laxity, normal patellar mobility and no bony tenderness. No tenderness found.  Lymphadenopathy:    He has no cervical adenopathy.  Neurological: He is oriented to person, place, and time.  Skin: Skin is warm and dry. No rash noted. He is not diaphoretic. No erythema. No pallor.  Psychiatric: He has a normal mood and affect. His behavior is normal. Judgment and thought content normal.  Vitals reviewed.   Lab Results  Component Value Date   WBC 5.4 11/22/2014   HGB 14.7 11/22/2014   HCT 44.5 11/22/2014   PLT 113.0* 11/22/2014   GLUCOSE 127* 11/22/2014   CHOL 102 05/30/2014   TRIG 163.0* 05/30/2014   HDL 27.90* 05/30/2014   LDLDIRECT 84.2 01/31/2014   LDLCALC 42 05/30/2014   ALT 30 05/30/2014   AST 22 05/30/2014   NA 139 11/22/2014   K 4.4 11/22/2014   CL 106 11/22/2014   CREATININE 0.95  11/22/2014   BUN 18 11/22/2014   CO2 29 11/22/2014   TSH 0.55 11/22/2014   PSA 0.36 05/30/2014   INR 1.13 11/30/2012   HGBA1C 7.3* 11/22/2014   MICROALBUR 0.7 11/22/2014    No results found.  Assessment & Plan:   Cody Hale was seen today for hyperlipidemia, diabetes, depression and osteoarthritis.  Diagnoses and all orders for this visit:  Malignant hypertension Orders: -     Basic metabolic panel; Future -     TSH; Future -     Urinalysis, Routine w reflex microscopic; Future  Type II diabetes mellitus with manifestations Orders: -     Basic metabolic panel; Future -     Hemoglobin A1c; Future -     Microalbumin / creatinine urine ratio; Future -     Urinalysis, Routine w reflex microscopic; Future -     simvastatin (ZOCOR) 20 MG tablet; Take 1 tablet (20 mg total) by mouth at bedtime.  B12 deficiency anemia Orders: -     CBC with  Differential/Platelet; Future  Depression with somatization Orders: -     Vilazodone HCl 10 & 20 & 40 MG KIT; Take 1 tablet by mouth daily.  Primary osteoarthritis of both knees Orders: -     HYDROcodone-acetaminophen (NORCO) 10-325 MG per tablet; Take 1 tablet by mouth every 8 (eight) hours as needed.  Hyperlipidemia with target LDL less than 70 Orders: -     simvastatin (ZOCOR) 20 MG tablet; Take 1 tablet (20 mg total) by mouth at bedtime.   I have discontinued Mr. Brouwer traMADol, atorvastatin, cyanocobalamin, and clonazePAM. I am also having him start on Vilazodone HCl, HYDROcodone-acetaminophen, and simvastatin. Additionally, I am having him maintain his multivitamin with minerals, nitroGLYCERIN, albuterol, carvedilol, rivaroxaban, sitaGLIPtin-metformin, Umeclidinium Bromide, Insulin Glargine, canagliflozin, glucose blood, ONETOUCH VERIO IQ SYSTEM, onetouch ultrasoft, glucose blood, losartan, furosemide, and carvedilol.  Meds ordered this encounter  Medications  . Vilazodone HCl 10 & 20 & 40 MG KIT    Sig: Take 1 tablet by mouth daily.    Dispense:  1 kit    Refill:  0  . HYDROcodone-acetaminophen (NORCO) 10-325 MG per tablet    Sig: Take 1 tablet by mouth every 8 (eight) hours as needed.    Dispense:  75 tablet    Refill:  0  . simvastatin (ZOCOR) 20 MG tablet    Sig: Take 1 tablet (20 mg total) by mouth at bedtime.    Dispense:  90 tablet    Refill:  3     Follow-up: Return in about 2 months (around 01/22/2015).  Scarlette Calico, MD

## 2014-11-22 NOTE — Patient Instructions (Signed)

## 2014-11-22 NOTE — Progress Notes (Signed)
Pre visit review using our clinic review tool, if applicable. No additional management support is needed unless otherwise documented below in the visit note. 

## 2014-11-23 ENCOUNTER — Encounter: Payer: Self-pay | Admitting: Internal Medicine

## 2014-11-23 MED ORDER — SIMVASTATIN 20 MG PO TABS: 20.0000 mg | ORAL_TABLET | Freq: Every day | ORAL | Status: DC

## 2014-11-25 ENCOUNTER — Telehealth: Payer: Self-pay | Admitting: Internal Medicine

## 2014-11-25 NOTE — Telephone Encounter (Signed)
LMOVM  Advising good results per lab letter.

## 2014-11-25 NOTE — Telephone Encounter (Signed)
Pt wife called in and would like a call back with the results of the pt labs

## 2014-12-04 IMAGING — CR DG SHOULDER 2+V*L*
4 series · 4 of 4 positions shown · non-contrast
Comparison: None.

CLINICAL DATA: Left shoulder pain for 1 month.  No known injury.

EXAM:
LEFT SHOULDER - 2+ VIEW

[view not recorded (1 of 4)]
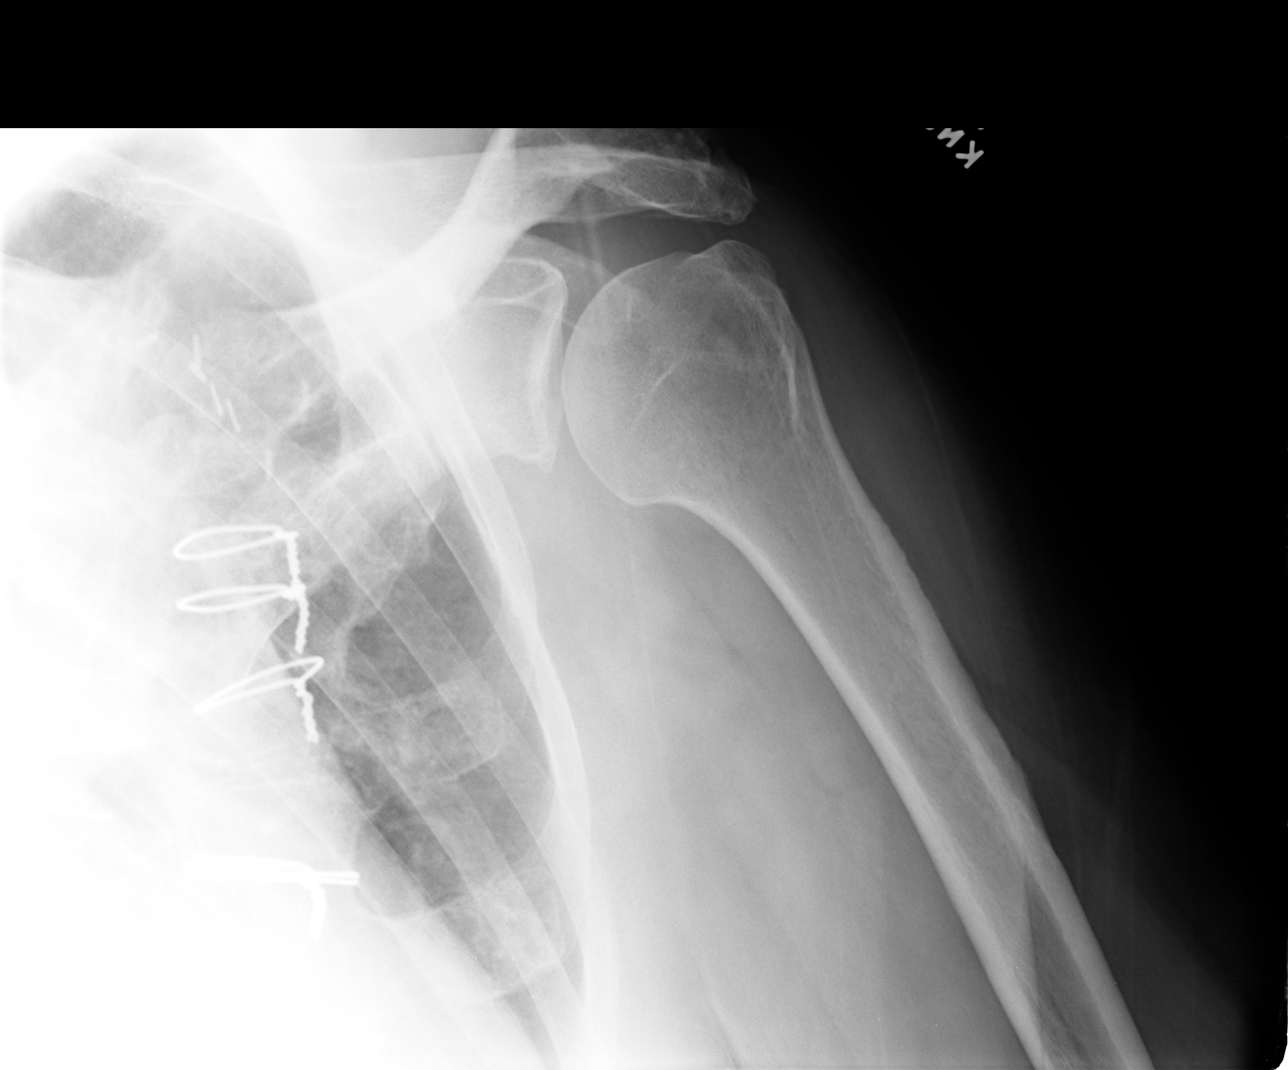

[view not recorded (2 of 4)]
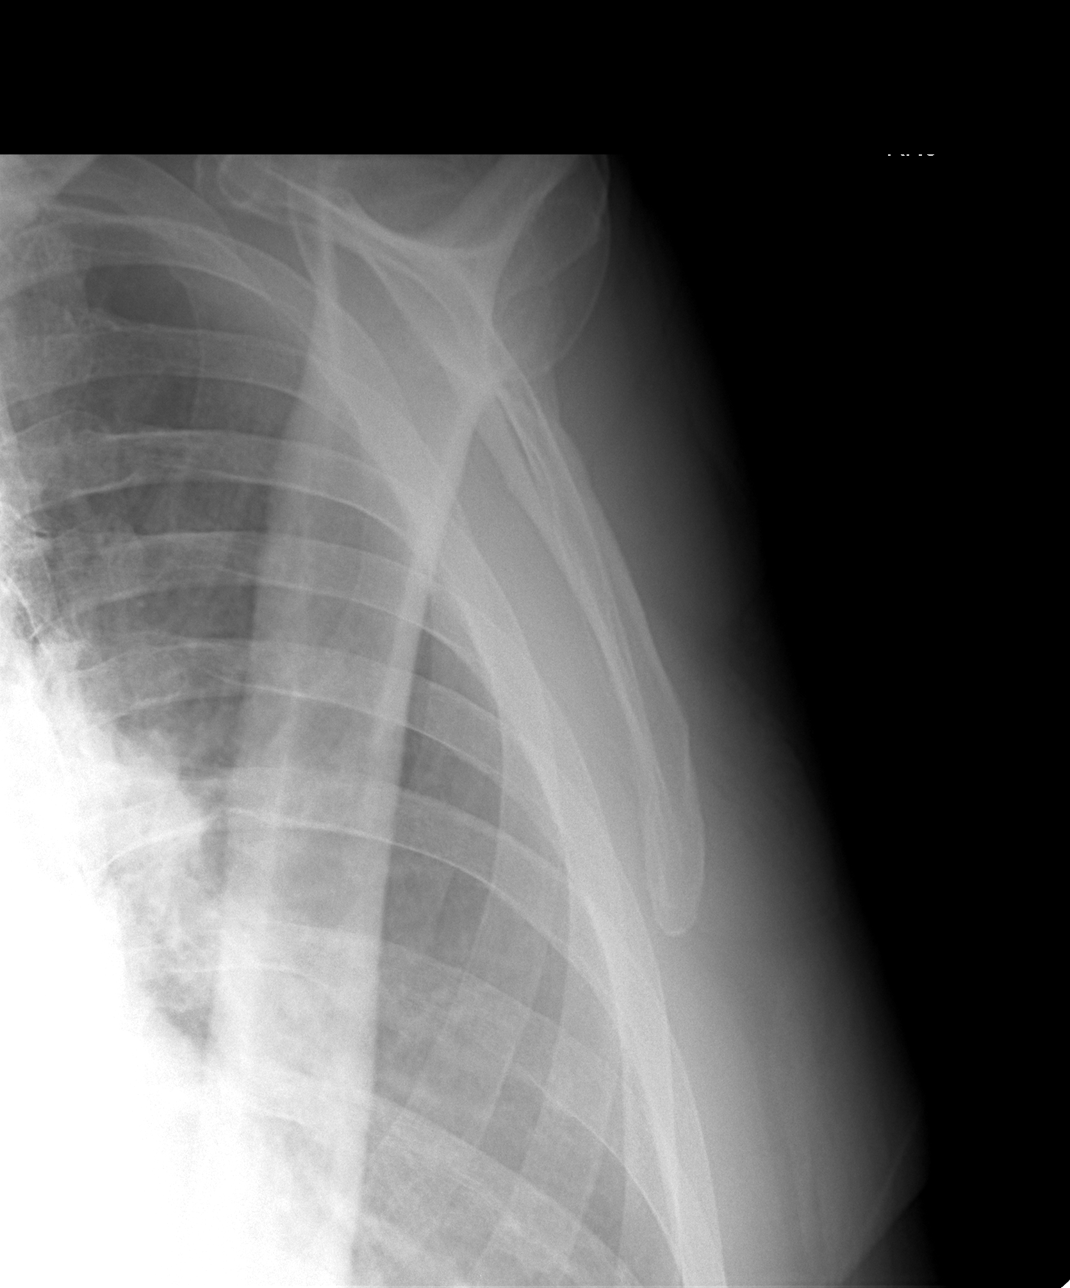

[view not recorded (3 of 4)]
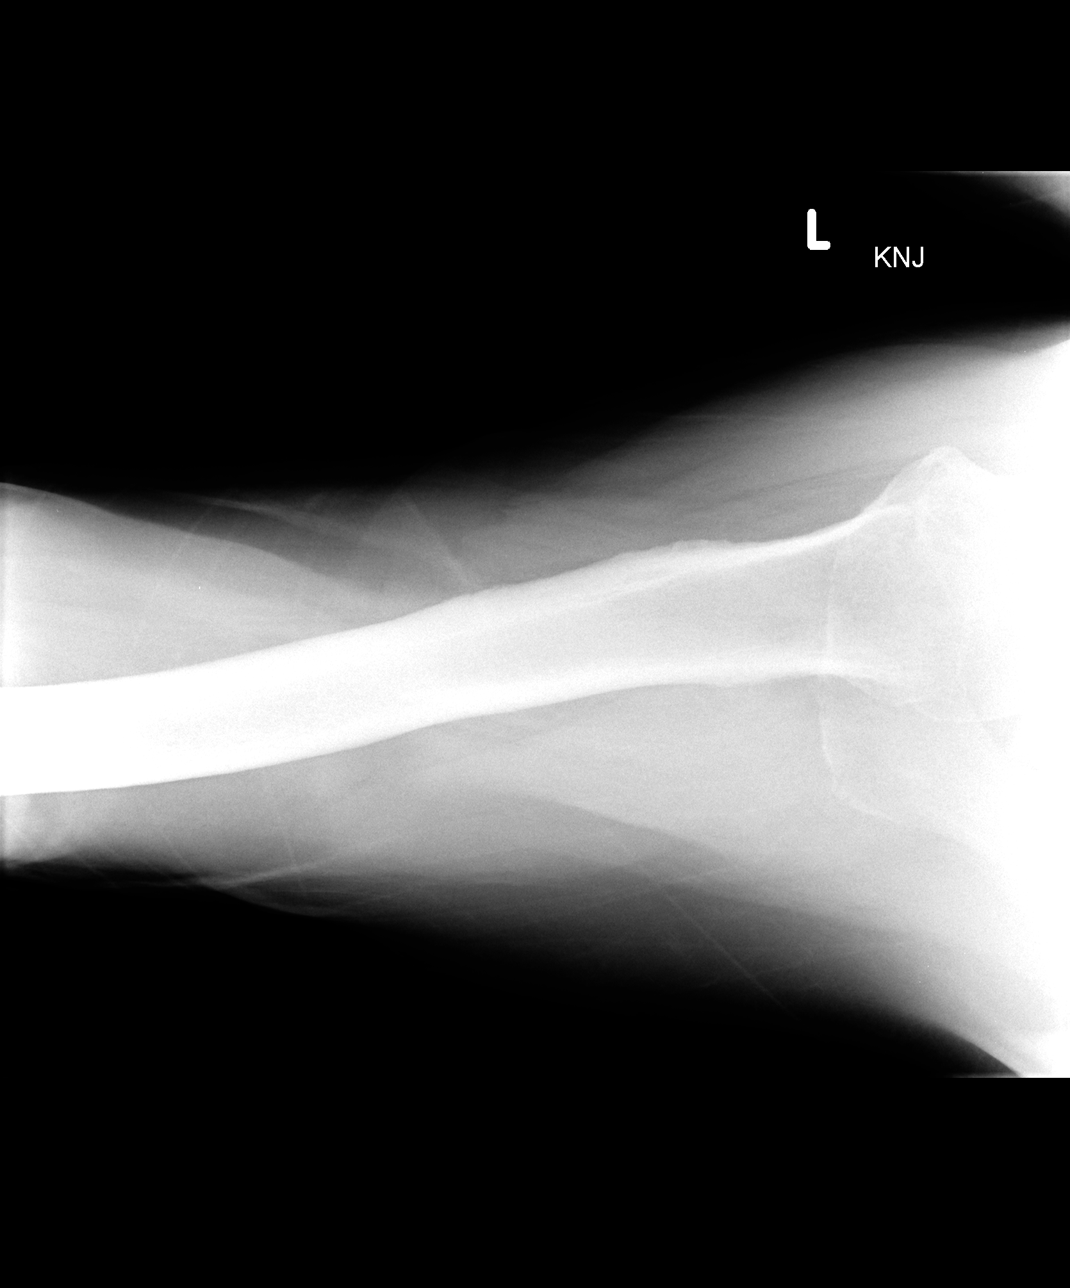

[view not recorded (4 of 4)]
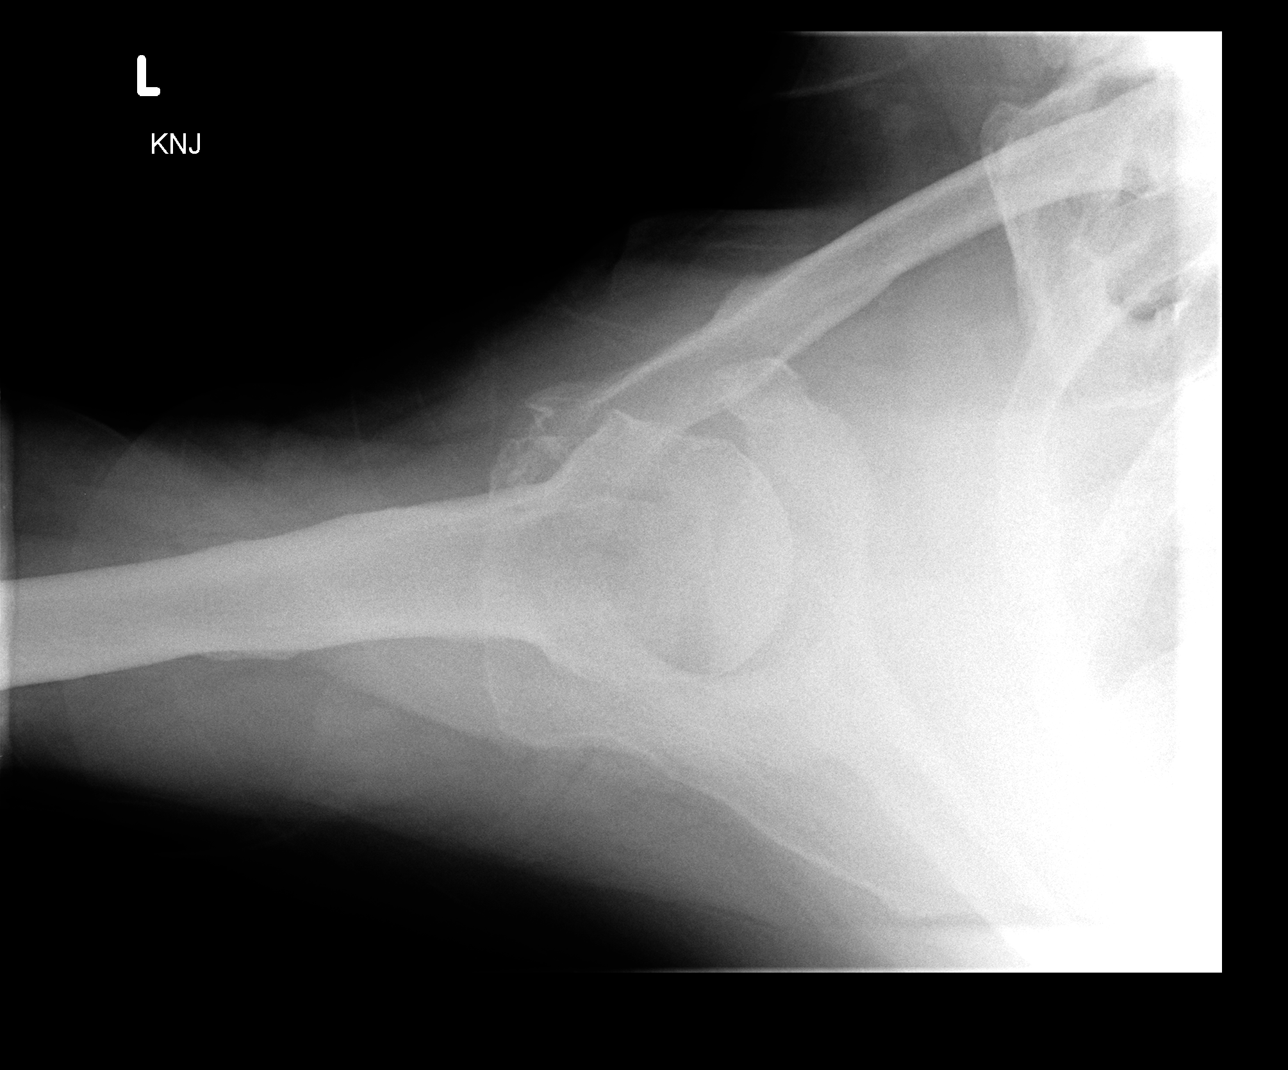

[4 of 4 positions shown; findings below may reference images not displayed]

FINDINGS: No fracture or dislocation.

Mild to moderate AC joint osteoarthritis. The glenohumeral joint is
well preserved. The underlying ribs are intact. The bones are
demineralized.
IMPRESSION: No fracture or dislocation. Mild to moderate AC joint
osteoarthritis.

## 2014-12-06 ENCOUNTER — Ambulatory Visit (INDEPENDENT_AMBULATORY_CARE_PROVIDER_SITE_OTHER): Payer: Medicaid Other | Admitting: *Deleted

## 2014-12-06 DIAGNOSIS — I429 Cardiomyopathy, unspecified: Secondary | ICD-10-CM

## 2014-12-06 NOTE — Progress Notes (Signed)
Pt was started on Xarelto 20mg  qhs for atrial fib on 05/06/14 by Dr Purvis Sheffield.   Pt denies any problems taking Xarelto.  Denies andy unusual bleeding, bruising or GI upset.  Reviewed patients medication list. Pt is not currently on any combined P-gp and strong CYP3A4 inhibitors/inducers (ketoconazole, traconazole, ritonavir, carbamazepine, phenytoin, rifampin, St. John's wort). Reviewed labs from 11/22/14: SCr 0.95, Weight 267.12, CrCl 135.07 Dose is appropriate based on CrCl. Hgb and HCT: 14.7/44.5   A full discussion of the nature of anticoagulants has been carried out. A benefit/risk analysis has been presented to the patient, so that they understand the justification for choosing anticoagulation with Xarelto at this time. The need for compliance is stressed. Pt is aware to take the medication once daily with the largest meal of the day. Side effects of potential bleeding are discussed, including unusual colored urine or stools, coughing up blood or coffee ground emesis, nose bleeds or serious fall or head trauma. Discussed signs and symptoms of stroke. The patient should avoid any OTC items containing aspirin or ibuprofen. Avoid alcohol consumption. Call if any signs of abnormal bleeding. Discussed financial obligations and resolved any difficulty in obtaining medication. Next lab test test in 6 months. Appt made for 12/16.

## 2014-12-20 ENCOUNTER — Encounter: Payer: Self-pay | Admitting: Internal Medicine

## 2015-01-03 ENCOUNTER — Ambulatory Visit (INDEPENDENT_AMBULATORY_CARE_PROVIDER_SITE_OTHER): Payer: Commercial Managed Care - HMO | Admitting: Internal Medicine

## 2015-01-03 VITALS — BP 106/58 | HR 65 | Temp 97.6°F | Resp 16 | Ht 70.0 in | Wt 265.0 lb

## 2015-01-03 DIAGNOSIS — F331 Major depressive disorder, recurrent, moderate: Secondary | ICD-10-CM

## 2015-01-03 DIAGNOSIS — F329 Major depressive disorder, single episode, unspecified: Secondary | ICD-10-CM | POA: Diagnosis not present

## 2015-01-03 DIAGNOSIS — F450 Somatization disorder: Secondary | ICD-10-CM

## 2015-01-03 DIAGNOSIS — I214 Non-ST elevation (NSTEMI) myocardial infarction: Secondary | ICD-10-CM | POA: Diagnosis not present

## 2015-01-03 DIAGNOSIS — F32A Depression, unspecified: Secondary | ICD-10-CM

## 2015-01-03 MED ORDER — VILAZODONE HCL 40 MG PO TABS: 40.0000 mg | ORAL_TABLET | Freq: Every day | ORAL | Status: DC

## 2015-01-03 NOTE — Progress Notes (Signed)
Subjective:  Patient ID: Cody Hale, male    DOB: 04-19-1951  Age: 64 y.o. MRN: [id number]  CC: Depression   HPI Cody Hale presents for f/up on depression - he tells me that viibryd is helping - his mood is getting better. He also tells me that he had a brief episode of DOE 2 weeks ago when he has emptying the trash, EMS was called to his house and he tells me that all was okay.  Outpatient Prescriptions Prior to Visit  Medication Sig Dispense Refill  . albuterol (PROVENTIL) (5 MG/ML) 0.5% nebulizer solution Take 0.5 mL (2.5 mg total) by nebulization every 2 (two) hours as needed for wheezing. 20 mL 12  . Blood Glucose Monitoring Suppl (ONETOUCH VERIO IQ SYSTEM) W/DEVICE KIT 1 Act by Does not apply route 3 (three) times daily. 2 kit 0  . canagliflozin (INVOKANA) 300 MG TABS tablet Take 300 mg by mouth daily before breakfast. 90 tablet 3  . carvedilol (COREG) 3.125 MG tablet TAKE ONE TABLET BY MOUTH TWICE DAILY WITH FOOD 60 tablet 5  . carvedilol (COREG) 6.25 MG tablet Take 1 tablet (6.25 mg total) by mouth 2 (two) times daily with a meal. 60 tablet 6  . furosemide (LASIX) 40 MG tablet TAKE ONE TABLET BY MOUTH TWICE DAILY 60 tablet 5  . glucose blood (ONETOUCH VERIO) test strip Use to check blood sugars twice a day Dx E11.8 100 each 3  . glucose blood test strip Use TID 100 each 12  . HYDROcodone-acetaminophen (NORCO) 10-325 MG per tablet Take 1 tablet by mouth every 8 (eight) hours as needed. 75 tablet 0  . Insulin Glargine (TOUJEO SOLOSTAR) 300 UNIT/ML SOPN Inject 30 Units into the skin daily. 1.5 mL 11  . Lancets (ONETOUCH ULTRASOFT) lancets Use to help check blood sugars twice a day E11.8 100 each 3  . losartan (COZAAR) 100 MG tablet TAKE ONE TABLET BY MOUTH ONCE DAILY 90 tablet 3  . Multiple Vitamin (MULTIVITAMIN WITH MINERALS) TABS Take 1 tablet by mouth daily.    . nitroGLYCERIN (NITROSTAT) 0.4 MG SL tablet Place 1 tablet (0.4 mg total) under the tongue every 5 (five)  minutes x 3 doses as needed for chest pain. 25 tablet 3  . rivaroxaban (XARELTO) 20 MG TABS tablet Take 1 tablet (20 mg total) by mouth daily with supper. 20 tablet 0  . simvastatin (ZOCOR) 20 MG tablet Take 1 tablet (20 mg total) by mouth at bedtime. 90 tablet 3  . sitaGLIPtin-metformin (JANUMET) 50-1000 MG per tablet Take 1 tablet by mouth 2 (two) times daily with a meal. 180 tablet 1  . Umeclidinium Bromide (INCRUSE ELLIPTA) 62.5 MCG/INH AEPB Inhale 1 puff into the lungs daily. 30 each 11  . Vilazodone HCl 10 & 20 & 40 MG KIT Take 1 tablet by mouth daily. 1 kit 0   No facility-administered medications prior to visit.    ROS Review of Systems  Constitutional: Negative.  Negative for fever, chills, diaphoresis and fatigue.  HENT: Negative.   Eyes: Negative.   Respiratory: Negative.  Negative for cough, choking, chest tightness, shortness of breath and stridor.   Cardiovascular: Negative.  Negative for chest pain, palpitations and leg swelling.  Gastrointestinal: Negative.  Negative for nausea, vomiting, abdominal pain, diarrhea, constipation and blood in stool.  Endocrine: Negative.   Genitourinary: Negative.   Musculoskeletal: Negative.   Skin: Negative.  Negative for rash.  Allergic/Immunologic: Negative.   Neurological: Negative.  Negative for dizziness, syncope, light-headedness  and numbness.  Psychiatric/Behavioral: Positive for dysphoric mood. Negative for suicidal ideas, hallucinations, behavioral problems, confusion, sleep disturbance, decreased concentration and agitation. The patient is nervous/anxious. The patient is not hyperactive.     Objective:  BP 106/58 mmHg  Pulse 65  Temp(Src) 97.6 F (36.4 C) (Oral)  Resp 16  Ht 5' 10"  (1.778 m)  Wt 265 lb (120.203 kg)  BMI 38.02 kg/m2  SpO2 97%  BP Readings from Last 3 Encounters:  01/03/15 106/58  11/22/14 102/64  10/10/14 127/63    Wt Readings from Last 3 Encounters:  01/03/15 265 lb (120.203 kg)  11/22/14 276 lb  (125.193 kg)  09/13/14 276 lb (125.193 kg)    Physical Exam  Constitutional: He is oriented to person, place, and time. He appears well-developed and well-nourished. No distress.  HENT:  Head: Normocephalic and atraumatic.  Mouth/Throat: Oropharynx is clear and moist. No oropharyngeal exudate.  Eyes: Conjunctivae are normal. Right eye exhibits no discharge. Left eye exhibits no discharge. No scleral icterus.  Neck: Normal range of motion. Neck supple. No JVD present. No tracheal deviation present. No thyromegaly present.  Cardiovascular: Normal rate, regular rhythm, S1 normal, S2 normal and intact distal pulses.  Exam reveals no gallop, no S3, no S4 and no friction rub.   Murmur heard.  Decrescendo systolic murmur is present with a grade of 1/6   No diastolic murmur is present  EKG today  Atrial fibrillation  -Left bundle branch block.   ABNORMAL    Pulmonary/Chest: Effort normal. No stridor. No respiratory distress. He has no wheezes. He has no rales. He exhibits no tenderness.  Abdominal: Soft. Bowel sounds are normal. He exhibits no distension and no mass. There is no tenderness. There is no rebound and no guarding.  Musculoskeletal: Normal range of motion. He exhibits no edema or tenderness.  Lymphadenopathy:    He has no cervical adenopathy.  Neurological: He is oriented to person, place, and time.  Skin: Skin is warm and dry. No rash noted. He is not diaphoretic. No erythema. No pallor.  Vitals reviewed.   Lab Results  Component Value Date   WBC 5.4 11/22/2014   HGB 14.7 11/22/2014   HCT 44.5 11/22/2014   PLT 113.0* 11/22/2014   GLUCOSE 127* 11/22/2014   CHOL 102 05/30/2014   TRIG 163.0* 05/30/2014   HDL 27.90* 05/30/2014   LDLDIRECT 84.2 01/31/2014   LDLCALC 42 05/30/2014   ALT 30 05/30/2014   AST 22 05/30/2014   NA 139 11/22/2014   K 4.4 11/22/2014   CL 106 11/22/2014   CREATININE 0.95 11/22/2014   BUN 18 11/22/2014   CO2 29 11/22/2014   TSH 0.55 11/22/2014    PSA 0.36 05/30/2014   INR 1.13 11/30/2012   HGBA1C 7.3* 11/22/2014   MICROALBUR 0.7 11/22/2014    No results found.  Assessment & Plan:   Cody Hale was seen today for depression.  Diagnoses and all orders for this visit:  NSTEMI (non-ST elevated myocardial infarction) - he had 1 brief episode of SOB and has felt well since then, he has had DOE before and this appears to be stable, his EKG is unchanged Orders: -     EKG 12-Lead  Depression with somatization - cont viibryd Orders: -     Vilazodone HCl (VIIBRYD) 40 MG TABS; Take 1 tablet (40 mg total) by mouth daily.  I have discontinued Cody Hale Vilazodone HCl. I am also having him start on Vilazodone HCl. Additionally, I am having him maintain his  multivitamin with minerals, nitroGLYCERIN, albuterol, carvedilol, rivaroxaban, sitaGLIPtin-metformin, Umeclidinium Bromide, Insulin Glargine, canagliflozin, glucose blood, ONETOUCH VERIO IQ SYSTEM, onetouch ultrasoft, glucose blood, losartan, furosemide, carvedilol, HYDROcodone-acetaminophen, and simvastatin.  Meds ordered this encounter  Medications  . Vilazodone HCl (VIIBRYD) 40 MG TABS    Sig: Take 1 tablet (40 mg total) by mouth daily.    Dispense:  30 tablet    Refill:  11     Follow-up: Return in about 3 months (around 04/05/2015).  Scarlette Calico, MD

## 2015-01-03 NOTE — Patient Instructions (Signed)

## 2015-01-03 NOTE — Progress Notes (Signed)
Pre visit review using our clinic review tool, if applicable. No additional management support is needed unless otherwise documented below in the visit note. 

## 2015-01-04 ENCOUNTER — Encounter: Payer: Self-pay | Admitting: Internal Medicine

## 2015-01-04 ENCOUNTER — Telehealth: Payer: Self-pay | Admitting: *Deleted

## 2015-01-04 NOTE — Telephone Encounter (Signed)
Wife & patient walked into office this afternoon.  Was concerned over BP at PMD office yesterday 106/58.  After further questioning, does state that he has been having increased SOB with activity.  This is new for him since last OV.  No chest pain.  Does have a lot of fatigue.  Informed patient that message would be sent to provider for further advice.

## 2015-01-05 NOTE — Telephone Encounter (Signed)
Can schedule to see NP tomorrow or Tuesday for further evaluation.

## 2015-01-05 NOTE — Telephone Encounter (Signed)
Wife Claris Che) notified.  OV scheduled for Wednesday, July 6 at 11:20 with Herma Carson, NP in our [place] office.

## 2015-01-11 ENCOUNTER — Encounter: Payer: Self-pay | Admitting: Physician Assistant

## 2015-01-11 ENCOUNTER — Telehealth: Payer: Self-pay | Admitting: *Deleted

## 2015-01-11 ENCOUNTER — Ambulatory Visit (INDEPENDENT_AMBULATORY_CARE_PROVIDER_SITE_OTHER): Payer: Commercial Managed Care - HMO | Admitting: Physician Assistant

## 2015-01-11 VITALS — BP 120/64 | HR 74 | Ht 70.0 in | Wt 261.2 lb

## 2015-01-11 DIAGNOSIS — I482 Chronic atrial fibrillation, unspecified: Secondary | ICD-10-CM

## 2015-01-11 DIAGNOSIS — I5022 Chronic systolic (congestive) heart failure: Secondary | ICD-10-CM | POA: Diagnosis not present

## 2015-01-11 DIAGNOSIS — I10 Essential (primary) hypertension: Secondary | ICD-10-CM | POA: Diagnosis not present

## 2015-01-11 DIAGNOSIS — Z953 Presence of xenogenic heart valve: Secondary | ICD-10-CM | POA: Diagnosis not present

## 2015-01-11 MED ORDER — CARVEDILOL 6.25 MG PO TABS: 6.2500 mg | ORAL_TABLET | Freq: Two times a day (BID) | ORAL | Status: DC

## 2015-01-11 MED ORDER — RIVAROXABAN 20 MG PO TABS: 20.0000 mg | ORAL_TABLET | Freq: Every day | ORAL | Status: DC

## 2015-01-11 NOTE — Assessment & Plan Note (Signed)
Patient's blood pressure is controlled 

## 2015-01-11 NOTE — Patient Instructions (Addendum)
Your physician wants you to follow-up in: 4 - 6 weeks with Dr. Purvis Sheffield. You will receive a reminder letter in the mail two months in advance. If you don't receive a letter, please call our office to schedule the follow-up appointment.   Do not work outside in [place] above [place].   Your physician has recommended you make the following change in your medication:   Increase Coreg to 6.25 Two times daily  You have been given a copy a 2 GM diet.   Thank you for choosing [facility]!

## 2015-01-11 NOTE — Telephone Encounter (Signed)
Patient walked into office.  Requesting samples of Xarelto 20mg .  Co-pay is $50.00.    4 bottle / #20 tabs given today.    Also, provided phone number for [facility] to see if they can assist him with the co-pay,

## 2015-01-11 NOTE — Assessment & Plan Note (Signed)
Increasing Coreg to 6.25 mg twice a day.CHADSVASC is 3. Continue Xarelto

## 2015-01-11 NOTE — Assessment & Plan Note (Addendum)
Patient has chronic heart failure EF was 35% in 2014 now is 45-50% on recent echo. He complains of worsening dyspnea on exertion. I suspect it's because he's been trying to push his wife in a wheelchair up inclines in the extreme heat. He is also getting excess of salt in his diet. There is no evidence of heart failure and exam today. He is only taking Coreg 3.125 mg twice a day and is supposed to be on 6.25 mg twice a day. I will increase his Coreg to 6.25 mg twice a day, recommend 2 g sodium diet, and avoid extreme heat. Follow-up with Dr.Koneswaran in 1 month. Consider stress testing in the future if his symptoms don't improve. Had a normal cath prior to his mitral valve surgery.

## 2015-01-11 NOTE — Assessment & Plan Note (Signed)
Stable
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
## 2015-01-19 ENCOUNTER — Encounter (HOSPITAL_COMMUNITY): Payer: Self-pay

## 2015-01-19 ENCOUNTER — Inpatient Hospital Stay (HOSPITAL_COMMUNITY)
Admission: EM | Admit: 2015-01-19 | Discharge: 2015-01-26 | DRG: 872 | Disposition: A | Discharge status: Home Health | Payer: Commercial Managed Care - HMO | Attending: Family Medicine | Admitting: Family Medicine

## 2015-01-19 ENCOUNTER — Emergency Department (HOSPITAL_COMMUNITY): Payer: Commercial Managed Care - HMO

## 2015-01-19 DIAGNOSIS — Z953 Presence of xenogenic heart valve: Secondary | ICD-10-CM | POA: Diagnosis not present

## 2015-01-19 DIAGNOSIS — L0390 Cellulitis, unspecified: Secondary | ICD-10-CM | POA: Diagnosis present

## 2015-01-19 DIAGNOSIS — I482 Chronic atrial fibrillation, unspecified: Secondary | ICD-10-CM | POA: Diagnosis present

## 2015-01-19 DIAGNOSIS — R0602 Shortness of breath: Secondary | ICD-10-CM | POA: Diagnosis present

## 2015-01-19 DIAGNOSIS — I5022 Chronic systolic (congestive) heart failure: Secondary | ICD-10-CM | POA: Diagnosis present

## 2015-01-19 DIAGNOSIS — D696 Thrombocytopenia, unspecified: Secondary | ICD-10-CM | POA: Diagnosis present

## 2015-01-19 DIAGNOSIS — Z794 Long term (current) use of insulin: Secondary | ICD-10-CM

## 2015-01-19 DIAGNOSIS — G8929 Other chronic pain: Secondary | ICD-10-CM | POA: Diagnosis present

## 2015-01-19 DIAGNOSIS — G4733 Obstructive sleep apnea (adult) (pediatric): Secondary | ICD-10-CM | POA: Diagnosis present

## 2015-01-19 DIAGNOSIS — Z6837 Body mass index (BMI) 37.0-37.9, adult: Secondary | ICD-10-CM

## 2015-01-19 DIAGNOSIS — Z8249 Family history of ischemic heart disease and other diseases of the circulatory system: Secondary | ICD-10-CM

## 2015-01-19 DIAGNOSIS — L03115 Cellulitis of right lower limb: Secondary | ICD-10-CM | POA: Diagnosis present

## 2015-01-19 DIAGNOSIS — I10 Essential (primary) hypertension: Secondary | ICD-10-CM | POA: Diagnosis present

## 2015-01-19 DIAGNOSIS — Z952 Presence of prosthetic heart valve: Secondary | ICD-10-CM | POA: Diagnosis not present

## 2015-01-19 DIAGNOSIS — Z7901 Long term (current) use of anticoagulants: Secondary | ICD-10-CM

## 2015-01-19 DIAGNOSIS — I872 Venous insufficiency (chronic) (peripheral): Secondary | ICD-10-CM | POA: Diagnosis present

## 2015-01-19 DIAGNOSIS — Z801 Family history of malignant neoplasm of trachea, bronchus and lung: Secondary | ICD-10-CM

## 2015-01-19 DIAGNOSIS — E66812 Obesity, class 2: Secondary | ICD-10-CM | POA: Diagnosis present

## 2015-01-19 DIAGNOSIS — Z833 Family history of diabetes mellitus: Secondary | ICD-10-CM | POA: Diagnosis not present

## 2015-01-19 DIAGNOSIS — Z811 Family history of alcohol abuse and dependence: Secondary | ICD-10-CM | POA: Diagnosis not present

## 2015-01-19 DIAGNOSIS — J4489 Other specified chronic obstructive pulmonary disease: Secondary | ICD-10-CM | POA: Diagnosis present

## 2015-01-19 DIAGNOSIS — K3184 Gastroparesis: Secondary | ICD-10-CM | POA: Diagnosis present

## 2015-01-19 DIAGNOSIS — R609 Edema, unspecified: Secondary | ICD-10-CM

## 2015-01-19 DIAGNOSIS — R6510 Systemic inflammatory response syndrome (SIRS) of non-infectious origin without acute organ dysfunction: Secondary | ICD-10-CM | POA: Diagnosis present

## 2015-01-19 DIAGNOSIS — E118 Type 2 diabetes mellitus with unspecified complications: Secondary | ICD-10-CM | POA: Diagnosis present

## 2015-01-19 DIAGNOSIS — J449 Chronic obstructive pulmonary disease, unspecified: Secondary | ICD-10-CM | POA: Diagnosis present

## 2015-01-19 DIAGNOSIS — N179 Acute kidney failure, unspecified: Secondary | ICD-10-CM | POA: Diagnosis present

## 2015-01-19 DIAGNOSIS — E1143 Type 2 diabetes mellitus with diabetic autonomic (poly)neuropathy: Secondary | ICD-10-CM | POA: Diagnosis present

## 2015-01-19 DIAGNOSIS — Z87891 Personal history of nicotine dependence: Secondary | ICD-10-CM

## 2015-01-19 DIAGNOSIS — Z6835 Body mass index (BMI) 35.0-35.9, adult: Secondary | ICD-10-CM

## 2015-01-19 DIAGNOSIS — A419 Sepsis, unspecified organism: Principal | ICD-10-CM | POA: Diagnosis present

## 2015-01-19 DIAGNOSIS — Z79899 Other long term (current) drug therapy: Secondary | ICD-10-CM | POA: Diagnosis not present

## 2015-01-19 DIAGNOSIS — F332 Major depressive disorder, recurrent severe without psychotic features: Secondary | ICD-10-CM | POA: Diagnosis present

## 2015-01-19 HISTORY — DX: Cellulitis, unspecified: L03.90

## 2015-01-19 LAB — COMPREHENSIVE METABOLIC PANEL
ALT: 24 U/L (ref 17–63)
AST: 25 U/L (ref 15–41)
Albumin: 4.3 g/dL (ref 3.5–5.0)
Alkaline Phosphatase: 58 U/L (ref 38–126)
Anion gap: 10 (ref 5–15)
BUN: 19 mg/dL (ref 6–20)
CO2: 29 mmol/L (ref 22–32)
Calcium: 9.2 mg/dL (ref 8.9–10.3)
Chloride: 99 mmol/L — ABNORMAL LOW (ref 101–111)
Creatinine, Ser: 1.3 mg/dL — ABNORMAL HIGH (ref 0.61–1.24)
GFR calc Af Amer: >60 mL/min (ref >60)
GFR calc non Af Amer: 56 mL/min — ABNORMAL LOW (ref >60)
Glucose, Bld: 194 mg/dL — ABNORMAL HIGH (ref 65–99)
Potassium: 4.9 mmol/L (ref 3.5–5.1)
Sodium: 138 mmol/L (ref 135–145)
Total Bilirubin: 1.7 mg/dL — ABNORMAL HIGH (ref 0.3–1.2)
Total Protein: 7.4 g/dL (ref 6.5–8.1)

## 2015-01-19 LAB — CBC WITH DIFFERENTIAL/PLATELET
Basophils Absolute: 0 10*3/uL (ref 0.0–0.1)
Basophils Relative: 0 % (ref 0–1)
Eosinophils Absolute: 0 10*3/uL (ref 0.0–0.7)
Eosinophils Relative: 0 % (ref 0–5)
HCT: 48.3 % (ref 39.0–52.0)
Hemoglobin: 15.5 g/dL (ref 13.0–17.0)
Lymphocytes Relative: 3 % — ABNORMAL LOW (ref 12–46)
Lymphs Abs: 0.8 10*3/uL (ref 0.7–4.0)
MCH: 30.9 pg (ref 26.0–34.0)
MCHC: 32.1 g/dL (ref 30.0–36.0)
MCV: 96.2 fL (ref 78.0–100.0)
Monocytes Absolute: 1.4 10*3/uL — ABNORMAL HIGH (ref 0.1–1.0)
Monocytes Relative: 6 % (ref 3–12)
Neutro Abs: 20.9 10*3/uL — ABNORMAL HIGH (ref 1.7–7.7)
Neutrophils Relative %: 91 % — ABNORMAL HIGH (ref 43–77)
Platelets: 94 10*3/uL — ABNORMAL LOW (ref 150–400)
RBC: 5.02 MIL/uL (ref 4.22–5.81)
RDW: 14.9 % (ref 11.5–15.5)
WBC Morphology: INCREASED
WBC: 23 10*3/uL — ABNORMAL HIGH (ref 4.0–10.5)

## 2015-01-19 LAB — URINALYSIS, ROUTINE W REFLEX MICROSCOPIC
Bilirubin Urine: NEGATIVE
Glucose, UA: >1000 mg/dL — AB
Hgb urine dipstick: NEGATIVE
Ketones, ur: NEGATIVE mg/dL
Leukocytes, UA: NEGATIVE
Nitrite: NEGATIVE
Protein, ur: NEGATIVE mg/dL
Specific Gravity, Urine: <1.005 — ABNORMAL LOW (ref 1.005–1.030)
Urobilinogen, UA: 0.2 mg/dL (ref 0.0–1.0)
pH: 6.5 (ref 5.0–8.0)

## 2015-01-19 LAB — GLUCOSE, CAPILLARY
Glucose-Capillary: 137 mg/dL — ABNORMAL HIGH (ref 65–99)
Glucose-Capillary: 142 mg/dL — ABNORMAL HIGH (ref 65–99)

## 2015-01-19 LAB — LACTIC ACID, PLASMA
Lactic Acid, Venous: 2.3 mmol/L (ref 0.5–2.0)
Lactic Acid, Venous: 1.5 mmol/L (ref 0.5–2.0)
Lactic Acid, Venous: 1.9 mmol/L (ref 0.5–2.0)
Lactic Acid, Venous: 1.3 mmol/L (ref 0.5–2.0)

## 2015-01-19 LAB — TSH: TSH: 0.311 u[IU]/mL — ABNORMAL LOW (ref 0.350–4.500)

## 2015-01-19 LAB — URINE MICROSCOPIC-ADD ON: Urine-Other: NONE SEEN

## 2015-01-19 MED ORDER — CARVEDILOL 3.125 MG PO TABS
6.2500 mg | ORAL_TABLET | Freq: Two times a day (BID) | ORAL | Status: DC
  Administered 2015-01-19 – 2015-01-26 (×14): 6.25 mg via ORAL
  Filled 2015-01-19 (×14): qty 2

## 2015-01-19 MED ORDER — ACETAMINOPHEN 325 MG PO TABS
650.0000 mg | ORAL_TABLET | Freq: Four times a day (QID) | ORAL | Status: DC | PRN
  Administered 2015-01-21 – 2015-01-25 (×2): 650 mg via ORAL
  Filled 2015-01-19 (×2): qty 2

## 2015-01-19 MED ORDER — BISACODYL 10 MG RE SUPP: 10.0000 mg | Freq: Every day | RECTAL | Status: DC | PRN

## 2015-01-19 MED ORDER — SIMVASTATIN 20 MG PO TABS
20.0000 mg | ORAL_TABLET | Freq: Every day | ORAL | Status: DC
  Administered 2015-01-19 – 2015-01-25 (×7): 20 mg via ORAL
  Filled 2015-01-19 (×7): qty 1

## 2015-01-19 MED ORDER — INSULIN GLARGINE 100 UNIT/ML ~~LOC~~ SOLN
5.0000 [IU] | Freq: Every day | SUBCUTANEOUS | Status: DC
  Administered 2015-01-19 – 2015-01-22 (×4): 5 [IU] via SUBCUTANEOUS
  Filled 2015-01-19 (×4): qty 0.05

## 2015-01-19 MED ORDER — VANCOMYCIN HCL 10 G IV SOLR
2000.0000 mg | Freq: Once | INTRAVENOUS | Status: AC
  Administered 2015-01-19: 2000 mg via INTRAVENOUS
  Filled 2015-01-19: qty 2000

## 2015-01-19 MED ORDER — INSULIN ASPART 100 UNIT/ML ~~LOC~~ SOLN
0.0000 [IU] | Freq: Three times a day (TID) | SUBCUTANEOUS | Status: DC
  Administered 2015-01-19 – 2015-01-20 (×3): 2 [IU] via SUBCUTANEOUS
  Administered 2015-01-21: 3 [IU] via SUBCUTANEOUS
  Administered 2015-01-21 – 2015-01-22 (×3): 2 [IU] via SUBCUTANEOUS
  Administered 2015-01-22: 3 [IU] via SUBCUTANEOUS
  Administered 2015-01-23 (×2): 2 [IU] via SUBCUTANEOUS
  Administered 2015-01-23 – 2015-01-24 (×3): 3 [IU] via SUBCUTANEOUS
  Administered 2015-01-24: 2 [IU] via SUBCUTANEOUS
  Administered 2015-01-25: 3 [IU] via SUBCUTANEOUS
  Administered 2015-01-25 – 2015-01-26 (×4): 2 [IU] via SUBCUTANEOUS

## 2015-01-19 MED ORDER — POLYETHYLENE GLYCOL 3350 17 G PO PACK: 17.0000 g | PACK | Freq: Every day | ORAL | Status: DC | PRN

## 2015-01-19 MED ORDER — RIVAROXABAN 20 MG PO TABS
20.0000 mg | ORAL_TABLET | Freq: Every day | ORAL | Status: DC
  Administered 2015-01-19 – 2015-01-25 (×7): 20 mg via ORAL
  Filled 2015-01-19 (×7): qty 1

## 2015-01-19 MED ORDER — SODIUM CHLORIDE 0.9 % IV SOLN
Freq: Once | INTRAVENOUS | Status: AC
  Administered 2015-01-19: 12:00:00 via INTRAVENOUS

## 2015-01-19 MED ORDER — SODIUM CHLORIDE 0.9 % IV SOLN
INTRAVENOUS | Status: AC
Start: 2015-01-19 — End: 2015-01-20
  Administered 2015-01-19: 18:00:00 via INTRAVENOUS

## 2015-01-19 MED ORDER — TRAZODONE HCL 50 MG PO TABS: 25.0000 mg | ORAL_TABLET | Freq: Every evening | ORAL | Status: DC | PRN

## 2015-01-19 MED ORDER — ACETAMINOPHEN 650 MG RE SUPP: 650.0000 mg | Freq: Four times a day (QID) | RECTAL | Status: DC | PRN

## 2015-01-19 MED ORDER — SODIUM CHLORIDE 0.9 % IJ SOLN
3.0000 mL | Freq: Two times a day (BID) | INTRAMUSCULAR | Status: DC
  Administered 2015-01-19 – 2015-01-26 (×13): 3 mL via INTRAVENOUS

## 2015-01-19 MED ORDER — HYDROCODONE-ACETAMINOPHEN 10-325 MG PO TABS
1.0000 | ORAL_TABLET | Freq: Three times a day (TID) | ORAL | Status: DC | PRN
  Administered 2015-01-19 – 2015-01-21 (×5): 1 via ORAL
  Filled 2015-01-19 (×5): qty 1

## 2015-01-19 MED ORDER — VANCOMYCIN HCL 10 G IV SOLR
1500.0000 mg | INTRAVENOUS | Status: DC
  Filled 2015-01-19 (×4): qty 1500

## 2015-01-19 MED ORDER — INSULIN ASPART 100 UNIT/ML ~~LOC~~ SOLN
0.0000 [IU] | Freq: Every day | SUBCUTANEOUS | Status: DC
  Administered 2015-01-23 – 2015-01-25 (×3): 2 [IU] via SUBCUTANEOUS

## 2015-01-19 MED ORDER — ACETAMINOPHEN 325 MG PO TABS
650.0000 mg | ORAL_TABLET | Freq: Once | ORAL | Status: AC
  Administered 2015-01-19: 650 mg via ORAL
  Filled 2015-01-19: qty 2

## 2015-01-19 NOTE — Progress Notes (Signed)
ANTIBIOTIC CONSULT NOTE  Pharmacy Consult for Vancomycin Indication: cellulitis  Allergies  Allergen Reactions  . Daptomycin Rash  . Tape Rash and Other (See Comments)    Adhesive Tape-Burn skin.    Patient Measurements: Height: 5\' 10"  (177.8 cm) Weight: 261 lb (118.389 kg) IBW/kg (Calculated) : 73  Vital Signs: Temp: 102.6 F (39.2 C) (07/14 1133) Temp Source: Rectal (07/14 1133) BP: 117/64 mmHg (07/14 1230) Pulse Rate: 85 (07/14 1200) Intake/Output from previous day:   Intake/Output from this shift:    Labs:  Recent Labs  01/19/15 1120  WBC 23.0*  HGB 15.5  PLT 94*  CREATININE 1.30*   Estimated Creatinine Clearance: 74.1 mL/min (by C-G formula based on Cr of 1.3). No results for input(s): VANCOTROUGH, VANCOPEAK, VANCORANDOM, GENTTROUGH, GENTPEAK, GENTRANDOM, TOBRATROUGH, TOBRAPEAK, TOBRARND, AMIKACINPEAK, AMIKACINTROU, AMIKACIN in the last 72 hours.   Microbiology: No results found for this or any previous visit (from the past 720 hour(s)).  Anti-infectives    Start     Dose/Rate Route Frequency Ordered Stop   01/19/15 1215  vancomycin (VANCOCIN) 2,000 mg in sodium chloride 0.9 % 500 mL IVPB     2,000 mg 250 mL/hr over 120 Minutes Intravenous  Once 01/19/15 1200        Assessment: 64 yo obese M admitted with altered mental status & fever (Tm 102.6 F).  WBC & lactic acid levels also elevated.  He is noted to have new erythematous region on the right inner calf as possible source.   Vancomycin initiated for cellulits.    Scr slightly elevated above patient's baseline.  NCrCl ~ 55-91ml/min.   Goal of Therapy:  Vancomycin trough level 10-15 mcg/ml  Plan:  Vancomycin 2000mg  IV loading dose x1 Vancomycin 1500mg  IV q24h Check Vancomycin trough at steady state Monitor renal function and cx data   Elson Clan 01/19/2015,1:24 PM

## 2015-01-19 NOTE — ED Notes (Signed)
CRITICAL VALUE ALERT  Critical value received:  Lactic acid 2.3  Date of notification:  01/19/15  Time of notification:  1204  Critical value read back:Yes.    Nurse who received alert:  Rminter, rn  MD notified (1st page):  Dr. Jeraldine Loots  Time of first page:  1204  MD notified (2nd page):  Time of second page:  Responding MD:  Dr. Jeraldine Loots  Time MD responded:  [phone]

## 2015-01-19 NOTE — ED Notes (Signed)
Hosptialist at bedside at this time 
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
## 2015-01-19 NOTE — ED Notes (Signed)
EMS reports pt was playing cards at [employer] and was called for altered mental status.  EMS arrived to find pt alert, oriented, but febrile.  Temp 101.8 and has chills.  BP 98/45.   Pt c/o pain to r lower leg that started around 0400.  Pt says he woke up sob at 4 am and called ems at that time.  Says he was told he was having a panic attack.   CBG 210.  Has history of mitral valve replacement and afib.

## 2015-01-19 NOTE — ED Notes (Signed)
Hospitalist at bedside 
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
## 2015-01-20 DIAGNOSIS — J449 Chronic obstructive pulmonary disease, unspecified: Secondary | ICD-10-CM

## 2015-01-20 DIAGNOSIS — N179 Acute kidney failure, unspecified: Secondary | ICD-10-CM

## 2015-01-20 LAB — GLUCOSE, CAPILLARY
Glucose-Capillary: 113 mg/dL — ABNORMAL HIGH (ref 65–99)
Glucose-Capillary: 137 mg/dL — ABNORMAL HIGH (ref 65–99)
Glucose-Capillary: 125 mg/dL — ABNORMAL HIGH (ref 65–99)

## 2015-01-20 LAB — CBC
HCT: 43.8 % (ref 39.0–52.0)
Hemoglobin: 14 g/dL (ref 13.0–17.0)
MCH: 30.5 pg (ref 26.0–34.0)
MCHC: 32 g/dL (ref 30.0–36.0)
MCV: 95.4 fL (ref 78.0–100.0)
Platelets: 94 10*3/uL — ABNORMAL LOW (ref 150–400)
RBC: 4.59 MIL/uL (ref 4.22–5.81)
RDW: 15.3 % (ref 11.5–15.5)
WBC: 12 10*3/uL — ABNORMAL HIGH (ref 4.0–10.5)

## 2015-01-20 LAB — BASIC METABOLIC PANEL
Anion gap: 8 (ref 5–15)
BUN: 17 mg/dL (ref 6–20)
CO2: 25 mmol/L (ref 22–32)
Calcium: 8.2 mg/dL — ABNORMAL LOW (ref 8.9–10.3)
Chloride: 105 mmol/L (ref 101–111)
Creatinine, Ser: 1.03 mg/dL (ref 0.61–1.24)
GFR calc Af Amer: >60 mL/min (ref >60)
GFR calc non Af Amer: >60 mL/min (ref >60)
Glucose, Bld: 127 mg/dL — ABNORMAL HIGH (ref 65–99)
Potassium: 3.8 mmol/L (ref 3.5–5.1)
Sodium: 138 mmol/L (ref 135–145)

## 2015-01-20 LAB — CLOSTRIDIUM DIFFICILE BY PCR: Toxigenic C. Difficile by PCR: NEGATIVE

## 2015-01-20 MED ORDER — PIPERACILLIN-TAZOBACTAM 3.375 G IVPB
3.3750 g | Freq: Three times a day (TID) | INTRAVENOUS | Status: DC
  Administered 2015-01-20 – 2015-01-26 (×18): 3.375 g via INTRAVENOUS
  Filled 2015-01-20 (×22): qty 50

## 2015-01-20 MED ORDER — VANCOMYCIN HCL IN DEXTROSE 750-5 MG/150ML-% IV SOLN
750.0000 mg | Freq: Two times a day (BID) | INTRAVENOUS | Status: DC
  Administered 2015-01-20 – 2015-01-22 (×6): 750 mg via INTRAVENOUS
  Filled 2015-01-20 (×9): qty 150

## 2015-01-20 NOTE — Progress Notes (Signed)
[facility] PROGRESS NOTE  Cody Hale ZOX:[id number] DOB: 29-Nov-1950 DOA: 01/19/2015 PCP: Sanda Linger, MD  HPI Cody Hale is a 64 y.o. male with a past medical history that includes diabetes, left leg cellulitis, hypertension, obesity, chronic venous insufficiency, chronic systolic heart failure, A. fib with left bundle branch block, mitral valve replacement on Xarelto, anxiety presents to the emergency department chief complaint fever, found to have RLE cellulitis.   Subjective: Reports little improvement in right lower leg. Reports continued "throbbing and pain" right lower leg. Denies nausea/voming. Did not sleep well  Assessment/Plan: Sepsis with leukocytosis, elevated lactic acid, fever. - Likely related to right lower extremity cellulitis.  - Little to no improvement this am.  - Leukocytosis improving but he remains febrile and somewhat ill appearing.  - Blood cultures no growth, Lactic acid normalized.   - Continue vancomycin day #2 and will add zosyn.   Acute renal failure  - Likely related to decreased oral intake related to above.  - Resolved this am, continue to monitor.  Cellulitis  - History of same but on the left leg per patient report.- - antibiotics as above  Essential hypertension, malignant:  - Systolic blood pressure range on admission 108 -119.   Chronic systolic heart failure  - remains compensated, daily weights   - Weight 119.4  From 120.6. Chart review indicates he saw his cardiologist last week. This note indicates his EF in 2014 was 35% and now it's 45-50%.  Note indicates he's not completely compliant with diet. His Coreg was increased to 6.25 twice a day at that time. - Continue to hold Lasix in the setting of sepsis, closely monitor respiratory status  Chronic atrial fibrillation:  - Rate controlled. - Coreg as above.  - On Xarelto.  S/P mitral valve replacement with bioprosthetic valve:  - Stable. On Xarelto  Type II  diabetes mellitus with manifestations: CBG range 113-142. Appetite remains unreliable. Continue long-acting insulin at lower dose as his appetite is unreliable. SSI. A1c in May 7 0.3.  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   Morbid obesity: BMI is 37.5 nutritional consult  OSA (obstructive sleep apnea): CPAP     Code Status: full Family Communication: none present Disposition Plan: home when ready   Consultants:  none  Procedures:  none  Antibiotics:  Vancomycin 01/19/15>>  Zosyn 01/20/15>>  Objective: Filed Vitals:   01/20/15 0516  BP: 119/57  Pulse: 92  Temp: 99.6 F (37.6 C)  Resp: 19    Intake/Output Summary (Last 24 hours) at 01/20/15 8119 Last data filed at 01/20/15 0849  Gross per 24 hour  Intake    480 ml  Output    302 ml  Net    178 ml   Filed Weights   01/19/15 1130 01/19/15 1651 01/20/15 0516  Weight: 118.389 kg (261 lb) 120.657 kg (266 lb) 119.432 kg (263 lb 4.8 oz)   Exam:  General:  Appears somewhat ill but no toxic  HEENT: no scleral icterus  Cardiovascular: irregularly irregular right LE with trace-1+ LE edema/erthema/heat/tenderness  Respiratory: normal effort BS clear no crackles  Abdomen: non-distended non-tender +BS  Musculoskeletal: no clubbing or cyanosis  Skin: right lower leg with erythema extending to lateral aspect, slightly shiny and a little more pink than yesterday. Continued tenderness and heat   Data Reviewed: Basic Metabolic Panel:  Recent Labs Lab 01/19/15 1120 01/20/15 0622  NA 138 138  K 4.9 3.8  CL 99* 105  CO2 29 25  GLUCOSE 194* 127*  BUN 19 17  CREATININE 1.30* 1.03  CALCIUM 9.2 8.2*   Liver Function Tests:  Recent Labs Lab 01/19/15 1120  AST 25  ALT 24  ALKPHOS 58  BILITOT 1.7*  PROT 7.4  ALBUMIN 4.3   CBC:  Recent Labs Lab 01/19/15 1120 01/20/15 0622  WBC 23.0* 12.0*  NEUTROABS 20.9*  --   HGB 15.5 14.0   HCT 48.3 43.8  MCV 96.2 95.4  PLT 94* 94*   CBG:  Recent Labs Lab 01/19/15 1742 01/19/15 2009 01/20/15 0810  GLUCAP 137* 142* 113*    Recent Results (from the past 240 hour(s))  Blood culture (routine x 2)     Status: None (Preliminary result)   Collection Time: 01/19/15 11:20 AM  Result Value Ref Range Status   Specimen Description BLOOD LEFT ANTECUBITAL DRAWN BY RN  Final   Special Requests   Final    BOTTLES DRAWN AEROBIC AND ANAEROBIC AEB=6CC ANA=4CC   Culture NO GROWTH < 24 HOURS  Final   Report Status PENDING  Incomplete  Blood culture (routine x 2)     Status: None (Preliminary result)   Collection Time: 01/19/15 11:25 AM  Result Value Ref Range Status   Specimen Description BLOOD RIGHT ANTECUBITAL  Final   Special Requests BOTTLES DRAWN AEROBIC ONLY 12CC  Final   Culture NO GROWTH < 24 HOURS  Final   Report Status PENDING  Incomplete     Studies: Dg Chest 2 View  01/19/2015   CLINICAL DATA:  Fever and weakness this morning.  EXAM: CHEST  2 VIEW  COMPARISON:  05/31/2015  FINDINGS: The heart is enlarged but stable. 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 The mediastinal and hilar contours are within normal limits and unchanged. The lungs are clear of acute process. No pleural effusion or pneumothorax.  Numerous remote healed right-sided rib fractures are noted. No acute bony findings.  IMPRESSION: Stable Cardiac enlargement but no acute pulmonary findings.   Electronically Signed   By: Rudie Meyer M.D.   On: 01/19/2015 13:50    Scheduled Meds: . carvedilol  6.25 mg Oral BID  . insulin aspart  0-15 Units Subcutaneous TID WC  . insulin aspart  0-5 Units Subcutaneous QHS  . insulin glargine  5 Units Subcutaneous QHS  . rivaroxaban  20 mg Oral Q supper  . simvastatin  20 mg Oral QHS  . sodium chloride  3 mL Intravenous Q12H  . vancomycin  1,500 mg Intravenous Q24H   Continuous Infusions:   Principal Problem:   SIRS (systemic inflammatory response  syndrome) Active Problems:   Morbid obesity   OSA (obstructive sleep apnea)   Essential hypertension, malignant   Type II diabetes mellitus with manifestations   COPD (chronic obstructive pulmonary disease) with chronic bronchitis   Chronic systolic heart failure   Chronic atrial fibrillation   S/P mitral valve replacement with bioprosthetic valve   Cellulitis   Acute renal failure    [facility]  [facility] Pager [phone]. If 7PM-7AM, please contact night-coverage at [email or web address], password [facility] 01/20/2015, 9:22 AM  LOS: 1 day   Patient was seen, examined,treatment plan was discussed with the Advance Practice Provider.  I have directly reviewed the clinical findings, lab, imaging studies and management of this patient in detail. I have made the necessary changes to the above noted documentation, and agree with the documentation, as recorded by the Advance Practice Provider.    [place]  Elvera Lennox, MD [facility] [phone]

## 2015-01-20 NOTE — Progress Notes (Signed)
ANTIBIOTIC CONSULT NOTE  Pharmacy Consult for Vancomycin and Zosyn Indication: cellulitis  Allergies  Allergen Reactions  . Daptomycin Rash  . Tape Rash and Other (See Comments)    Adhesive Tape-Burn skin.    Patient Measurements: Height: 5\' 10"  (177.8 cm) Weight: 263 lb 4.8 oz (119.432 kg) IBW/kg (Calculated) : 73  Vital Signs: Temp: 99.6 F (37.6 C) (07/15 0516) Temp Source: Oral (07/15 0516) BP: 119/57 mmHg (07/15 0516) Pulse Rate: 92 (07/15 0516) Intake/Output from previous day: 07/14 0701 - 07/15 0700 In: 240 [P.O.:240] Out: 2 [Urine:2] Intake/Output from this shift: Total I/O In: 243 [P.O.:240; I.V.:3] Out: 300 [Urine:300]  Labs:  Recent Labs  01/19/15 1120 01/20/15 0622  WBC 23.0* 12.0*  HGB 15.5 14.0  PLT 94* 94*  CREATININE 1.30* 1.03   Estimated Creatinine Clearance: 93.9 mL/min (by C-G formula based on Cr of 1.03). No results for input(s): VANCOTROUGH, VANCOPEAK, VANCORANDOM, GENTTROUGH, GENTPEAK, GENTRANDOM, TOBRATROUGH, TOBRAPEAK, TOBRARND, AMIKACINPEAK, AMIKACINTROU, AMIKACIN in the last 72 hours.   Microbiology: Recent Results (from the past 720 hour(s))  Blood culture (routine x 2)     Status: None (Preliminary result)   Collection Time: 01/19/15 11:20 AM  Result Value Ref Range Status   Specimen Description BLOOD LEFT ANTECUBITAL DRAWN BY RN  Final   Special Requests   Final    BOTTLES DRAWN AEROBIC AND ANAEROBIC AEB=6CC ANA=4CC   Culture NO GROWTH < 24 HOURS  Final   Report Status PENDING  Incomplete  Blood culture (routine x 2)     Status: None (Preliminary result)   Collection Time: 01/19/15 11:25 AM  Result Value Ref Range Status   Specimen Description BLOOD RIGHT ANTECUBITAL  Final   Special Requests BOTTLES DRAWN AEROBIC ONLY 12CC  Final   Culture NO GROWTH < 24 HOURS  Final   Report Status PENDING  Incomplete    Anti-infectives    Start     Dose/Rate Route Frequency Ordered Stop   01/20/15 1100  piperacillin-tazobactam (ZOSYN)  IVPB 3.375 g     3.375 g 12.5 mL/hr over 240 Minutes Intravenous Every 8 hours 01/20/15 0944     01/20/15 1000  vancomycin (VANCOCIN) 1,500 mg in sodium chloride 0.9 % 500 mL IVPB  Status:  Discontinued     1,500 mg 250 mL/hr over 120 Minutes Intravenous Every 24 hours 01/19/15 1703 01/20/15 0943   01/20/15 1000  vancomycin (VANCOCIN) IVPB 750 mg/150 ml premix     750 mg 150 mL/hr over 60 Minutes Intravenous Every 12 hours 01/20/15 0944     01/19/15 1215  vancomycin (VANCOCIN) 2,000 mg in sodium chloride 0.9 % 500 mL IVPB     2,000 mg 250 mL/hr over 120 Minutes Intravenous  Once 01/19/15 1200 01/19/15 1434     Assessment: 64 yo obese M admitted with altered mental status & fever (Tm 102.6 F).  WBC & lactic acid levels also elevated.  He is noted to have new erythematous region on the right inner calf as possible source.  SCr has improved since ABX initiated.  Vancomycin initiated for cellulits.  Zosyn added 7/15    Goal of Therapy:  Vancomycin trough level 10-15 mcg/ml  Plan: Zosyn 3.375gm IV q8h, each dose over 4 hrs Change Vancomycin to 750mg  IV q12hrs (renal fxn improved) Check Vancomycin trough at steady state Monitor renal function and cx data   Valrie Hart A 01/20/2015,11:54 AM

## 2015-01-20 NOTE — Care Management Note (Signed)
Case Management Note  Patient Details  Name: ALLEY GORE MRN: [id number] Date of Birth: Sep 18, 1950  Subjective/Objective:                  Pt admitted from home with SIRS. Pt lives with his family and will return home at discharge. Pt uses a cane prn.  Action/Plan: No CM needs noted.  Expected Discharge Date:    01/22/15             Expected Discharge Plan:  Home/Self Care  In-House Referral:  NA  Discharge planning Services  CM Consult  Post Acute Care Choice:  NA Choice offered to:  NA  DME Arranged:    DME Agency:     HH Arranged:    HH Agency:     Status of Service:  Completed, signed off  Medicare Important Message Given:  Yes-second notification given Date Medicare IM Given:    Medicare IM give by:    Date Additional Medicare IM Given:    Additional Medicare Important Message give by:     If discussed at Long Length of Stay Meetings, dates discussed:    Additional Comments:  Cheryl Flash, RN 01/20/2015, 2:40 PM

## 2015-01-20 NOTE — Care Management Important Message (Signed)
Important Message  Patient Details  Name: Cody Hale MRN: [id number] Date of Birth: 07/18/1950   Medicare Important Message Given:  Yes-second notification given    Cheryl Flash, RN 01/20/2015, 12:24 PM

## 2015-01-21 ENCOUNTER — Inpatient Hospital Stay (HOSPITAL_COMMUNITY): Payer: Commercial Managed Care - HMO

## 2015-01-21 LAB — GLUCOSE, CAPILLARY
Glucose-Capillary: 131 mg/dL — ABNORMAL HIGH (ref 65–99)
Glucose-Capillary: 114 mg/dL — ABNORMAL HIGH (ref 65–99)
Glucose-Capillary: 128 mg/dL — ABNORMAL HIGH (ref 65–99)
Glucose-Capillary: 174 mg/dL — ABNORMAL HIGH (ref 65–99)
Glucose-Capillary: 153 mg/dL — ABNORMAL HIGH (ref 65–99)

## 2015-01-21 LAB — CBC
HCT: 43.9 % (ref 39.0–52.0)
Hemoglobin: 14.1 g/dL (ref 13.0–17.0)
MCH: 30.9 pg (ref 26.0–34.0)
MCHC: 32.1 g/dL (ref 30.0–36.0)
MCV: 96.1 fL (ref 78.0–100.0)
Platelets: 86 10*3/uL — ABNORMAL LOW (ref 150–400)
RBC: 4.57 MIL/uL (ref 4.22–5.81)
RDW: 15.3 % (ref 11.5–15.5)
WBC: 6.5 10*3/uL (ref 4.0–10.5)

## 2015-01-21 LAB — BASIC METABOLIC PANEL
Anion gap: 6 (ref 5–15)
BUN: 16 mg/dL (ref 6–20)
CO2: 27 mmol/L (ref 22–32)
Calcium: 8.1 mg/dL — ABNORMAL LOW (ref 8.9–10.3)
Chloride: 104 mmol/L (ref 101–111)
Creatinine, Ser: 0.96 mg/dL (ref 0.61–1.24)
GFR calc Af Amer: >60 mL/min (ref >60)
GFR calc non Af Amer: >60 mL/min (ref >60)
Glucose, Bld: 125 mg/dL — ABNORMAL HIGH (ref 65–99)
Potassium: 3.9 mmol/L (ref 3.5–5.1)
Sodium: 137 mmol/L (ref 135–145)

## 2015-01-21 MED ORDER — GADOBENATE DIMEGLUMINE 529 MG/ML IV SOLN
20.0000 mL | Freq: Once | INTRAVENOUS | Status: AC | PRN
  Administered 2015-01-21: 20 mL via INTRAVENOUS

## 2015-01-21 NOTE — Progress Notes (Addendum)
[facility] PROGRESS NOTE  TOUA DROGE IHW:[id number] DOB: 1951-01-14 DOA: 01/19/2015 PCP: Sanda Linger, MD  HPI [name] is a 64 y.o. male with a past medical history that includes diabetes, left leg cellulitis, hypertension, obesity, chronic venous insufficiency, chronic systolic heart failure, A. fib with left bundle branch block, mitral valve replacement on Xarelto, anxiety presents to the emergency department chief complaint fever, found to have RLE cellulitis.   Subjective: - pain worse today, afebrile overnight  Assessment/Plan: Sepsis with leukocytosis, elevated lactic acid, fever. - Likely related to right lower extremity cellulitis.  - pain worse today on ambulation, obtain MRI - Leukocytosis improving  - Blood cultures no growth, Lactic acid normalized.   - Continue vancomycin and zosyn.   Acute renal failure  - Likely related to decreased oral intake related to above.  - Resolved this am, continue to monitor.  Cellulitis  - History of same but on the left leg per patient report.- - antibiotics as above  Essential hypertension, malignant:  - controlled  Chronic systolic heart failure  - remains compensated, daily weights   - Chart review indicates he saw his cardiologist last week. This note indicates his EF in 2014 was 35% and now it's 45-50%.  Note indicates he's not completely compliant with diet. His Coreg was increased to 6.25 twice a day at that time. - Continue to hold Lasix in the setting of sepsis, closely monitor respiratory status, probably resume lasix tomorrow  Chronic atrial fibrillation:  - Rate controlled. - Coreg as above.  - On Xarelto.  S/P mitral valve replacement with bioprosthetic valve:  - Stable. On Xarelto  Type II diabetes mellitus with manifestations: CBG range 113-142. Appetite remains unreliable. Continue long-acting insulin at lower dose as his appetite is unreliable. SSI. A1c in May 7 0.3.  COPD (chronic  obstructive pulmonary disease) w - ith chronic bronchitis:  Remains stable at baseline.   Morbid obesity: BMI is 37.5 nutritional consult  OSA (obstructive sleep apnea): CPAP   Thrombocytopenia - in the setting of sepsis, also he has a component of chronic thrombocytopenia   Code Status: full Family Communication: none present Disposition Plan: home when ready   Consultants:  none  Procedures:  none  Antibiotics:  Vancomycin 01/19/15>>  Zosyn 01/20/15>>  Objective: Filed Vitals:   01/21/15 0628  BP: 99/45  Pulse: 87  Temp: 98.7 F (37.1 C)  Resp: 20    Intake/Output Summary (Last 24 hours) at 01/21/15 1308 Last data filed at 01/20/15 1733  Gross per 24 hour  Intake      0 ml  Output    600 ml  Net   -600 ml   Filed Weights   01/19/15 1651 01/20/15 0516 01/21/15 0555  Weight: 120.657 kg (266 lb) 119.432 kg (263 lb 4.8 oz) 118.428 kg (261 lb 1.4 oz)   Exam:  General:  NAD  HEENT: no scleral icterus  Cardiovascular: irregularly irregular right LE with trace-1+ LE edema/erthema/heat/tenderness  Respiratory: normal effort BS clear no crackles  Abdomen: non-distended non-tender +BS  Musculoskeletal: no clubbing or cyanosis  Skin: right lower leg with erythema extending to lateral aspect. Continued tenderness and heat   Data Reviewed: Basic Metabolic Panel:  Recent Labs Lab 01/19/15 1120 01/20/15 0622 01/21/15 0617  NA 138 138 137  K 4.9 3.8 3.9  CL 99* 105 104  CO2 29 25 27   GLUCOSE 194* 127* 125*  BUN 19 17 16   CREATININE 1.30* 1.03 0.96  CALCIUM 9.2  8.2* 8.1*   Liver Function Tests:  Recent Labs Lab 01/19/15 1120  AST 25  ALT 24  ALKPHOS 58  BILITOT 1.7*  PROT 7.4  ALBUMIN 4.3   CBC:  Recent Labs Lab 01/19/15 1120 01/20/15 0622 01/21/15 0617  WBC 23.0* 12.0* 6.5  NEUTROABS 20.9*  --   --   HGB 15.5 14.0 14.1  HCT 48.3 43.8 43.9  MCV 96.2 95.4 96.1  PLT 94* 94* 86*   CBG:  Recent Labs Lab 01/20/15 1227  01/20/15 1640 01/20/15 2309 01/21/15 0745 01/21/15 1248  GLUCAP 137* 125* 131* 114* 128*    Recent Results (from the past 240 hour(s))  Blood culture (routine x 2)     Status: None (Preliminary result)   Collection Time: 01/19/15 11:20 AM  Result Value Ref Range Status   Specimen Description BLOOD LEFT ANTECUBITAL DRAWN BY RN  Final   Special Requests   Final    BOTTLES DRAWN AEROBIC AND ANAEROBIC AEB=6CC ANA=4CC   Culture NO GROWTH < 24 HOURS  Final   Report Status PENDING  Incomplete  Blood culture (routine x 2)     Status: None (Preliminary result)   Collection Time: 01/19/15 11:25 AM  Result Value Ref Range Status   Specimen Description BLOOD RIGHT ANTECUBITAL  Final   Special Requests BOTTLES DRAWN AEROBIC ONLY 12CC  Final   Culture NO GROWTH < 24 HOURS  Final   Report Status PENDING  Incomplete  Clostridium Difficile by PCR (not at [facility])     Status: None   Collection Time: 01/20/15  7:52 PM  Result Value Ref Range Status   C difficile by pcr NEGATIVE NEGATIVE Final     Studies: Dg Chest 2 View  01/19/2015   CLINICAL DATA:  Fever and weakness this morning.  EXAM: CHEST  2 VIEW  COMPARISON:  05/31/2015  FINDINGS: The heart is enlarged but stable. 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 The mediastinal and hilar contours are within normal limits and unchanged. The lungs are clear of acute process. No pleural effusion or pneumothorax.  Numerous remote healed right-sided rib fractures are noted. No acute bony findings.  IMPRESSION: Stable Cardiac enlargement but no acute pulmonary findings.   Electronically Signed   By: Rudie Meyer M.D.   On: 01/19/2015 13:50   Mr Tibia Fibula Right W Wo Contrast  01/21/2015   CLINICAL DATA:  Right leg cellulitis. History of left leg cellulitis.  EXAM: MRI OF LOWER RIGHT EXTREMITY WITHOUT AND WITH CONTRAST  TECHNIQUE: Multiplanar, multisequence MR imaging of the left leg was performed both before and after administration of intravenous  contrast.  CONTRAST:  20mL MULTIHANCE GADOBENATE DIMEGLUMINE 529 MG/ML IV SOLN  COMPARISON:  None.  FINDINGS: There is no marrow signal abnormality. There is no acute fracture or dislocation. There is no periosteal reaction. There is no focal muscle signal abnormality. There is no abnormal muscle enhancement. There is soft tissue edema involving the right lower leg circumferentially with mild enhancement. There is no drainable fluid collection.  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  IMPRESSION: 1. Cellulitis of the right lower leg without a drainable abscess. 2. No osseous abnormality.   Electronically Signed   By: Elige Ko   On: 01/21/2015 12:29    Scheduled Meds: . carvedilol  6.25 mg Oral BID  . insulin aspart  0-15 Units Subcutaneous TID WC  . insulin aspart  0-5 Units Subcutaneous QHS  . insulin glargine  5  Units Subcutaneous QHS  . piperacillin-tazobactam (ZOSYN)  IV  3.375 g Intravenous Q8H  . rivaroxaban  20 mg Oral Q supper  . simvastatin  20 mg Oral QHS  . sodium chloride  3 mL Intravenous Q12H  . vancomycin  750 mg Intravenous Q12H   Continuous Infusions:   Principal Problem:   SIRS (systemic inflammatory response syndrome) Active Problems:   Morbid obesity   OSA (obstructive sleep apnea)   Essential hypertension, malignant   Type II diabetes mellitus with manifestations   COPD (chronic obstructive pulmonary disease) with chronic bronchitis   Chronic systolic heart failure   Chronic atrial fibrillation   S/P mitral valve replacement with bioprosthetic valve   Cellulitis   Acute renal failure    Pamella Pert  [facility] Pager [phone]. If 7PM-7AM, please contact night-coverage at [email or web address], password [email or web address] 01/21/2015, 1:08 PM  LOS: 2 days

## 2015-01-22 LAB — GLUCOSE, CAPILLARY
Glucose-Capillary: 141 mg/dL — ABNORMAL HIGH (ref 65–99)
Glucose-Capillary: 167 mg/dL — ABNORMAL HIGH (ref 65–99)
Glucose-Capillary: 132 mg/dL — ABNORMAL HIGH (ref 65–99)
Glucose-Capillary: 162 mg/dL — ABNORMAL HIGH (ref 65–99)

## 2015-01-22 MED ORDER — FUROSEMIDE 40 MG PO TABS
40.0000 mg | ORAL_TABLET | Freq: Two times a day (BID) | ORAL | Status: DC
  Administered 2015-01-22 – 2015-01-26 (×8): 40 mg via ORAL
  Filled 2015-01-22 (×8): qty 1

## 2015-01-22 NOTE — Progress Notes (Signed)
[facility] PROGRESS NOTE  Cody Hale:[id number] DOB: 1950/12/21 DOA: 01/19/2015 PCP: Sanda Linger, MD  HPI Cody Hale is a 64 y.o. male with a past medical history that includes diabetes, left leg cellulitis, hypertension, obesity, chronic venous insufficiency, chronic systolic heart failure, A. fib with left bundle branch block, mitral valve replacement on Xarelto, anxiety presents to the emergency department chief complaint fever, found to have RLE cellulitis.   Subjective: - appreciates improvement in his leg - no chest pain, shortness of breath, no abdominal pain, nausea or vomiting.   Assessment/Plan: Sepsis with leukocytosis, elevated lactic acid, fever - Likely related to right lower extremity cellulitis.  - pain worse 7/16 on ambulation, obtained MRI which did not show any abscess or drainable collections - Leukocytosis improving  - Blood cultures no growth, Lactic acid normalized.   - Continue IV antibiotics with vancomycin and zosyn.   Acute renal failure  - Likely related to decreased oral intake related to above.  - Resolved, continue to monitor.  Cellulitis  - History of same but on the left leg per patient report.- - antibiotics as above  Essential hypertension, malignant:  - controlled  Chronic systolic heart failure  - remains compensated, daily weights   - Chart review indicates he saw his cardiologist last week. This note indicates his EF in 2014 was 35% and now it's 45-50%.  Note indicates he's not completely compliant with diet. His Coreg was increased to 6.25 twice a day at that time. - resume lasix today   Chronic atrial fibrillation:  - Rate controlled. - Coreg as above.  - On Xarelto.  S/P mitral valve replacement with bioprosthetic valve:  - Stable. On Xarelto  Type II diabetes mellitus with manifestations: CBG range 113-142. Appetite remains unreliable. Continue long-acting insulin at lower dose as his appetite is  unreliable. SSI. A1c in May 7 0.3.  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   Morbid obesity: BMI is 37.5 nutritional consult  OSA (obstructive sleep apnea): CPAP   Thrombocytopenia - in the setting of sepsis, also he has a component of chronic thrombocytopenia - monitor CBC in am   Code Status: full Family Communication: none present Disposition Plan: home when ready   Consultants:  none  Procedures:  none  Antibiotics:  Vancomycin 01/19/15>>  Zosyn 01/20/15>>  Objective: Filed Vitals:   01/22/15 1416  BP: 101/61  Pulse: 73  Temp: 98.2 F (36.8 C)  Resp: 18    Intake/Output Summary (Last 24 hours) at 01/22/15 1608 Last data filed at 01/22/15 1417  Gross per 24 hour  Intake    720 ml  Output   2350 ml  Net  -1630 ml   Filed Weights   01/20/15 0516 01/21/15 0555 01/22/15 0451  Weight: 119.432 kg (263 lb 4.8 oz) 118.428 kg (261 lb 1.4 oz) 122.607 kg (270 lb 4.8 oz)   Exam:  General:  NAD  HEENT: no scleral icterus  Cardiovascular: irregularly irregular right LE with trace-1+ LE edema/erthema/heat/tenderness  Respiratory: normal effort BS clear no crackles  Abdomen: non-distended non-tender +BS  Musculoskeletal: no clubbing or cyanosis  Skin: right lower leg with erythema extending to lateral aspect. Continued tenderness and heat, improved today   Data Reviewed: Basic Metabolic Panel:  Recent Labs Lab 01/19/15 1120 01/20/15 0622 01/21/15 0617  NA 138 138 137  K 4.9 3.8 3.9  CL 99* 105 104  CO2 GLUCOSE 194*  127* 125*  BUN 19 17 16   CREATININE 1.30* 1.03 0.96  CALCIUM 9.2 8.2* 8.1*   Liver Function Tests:  Recent Labs Lab 01/19/15 1120  AST 25  ALT 24  ALKPHOS 58  BILITOT 1.7*  PROT 7.4  ALBUMIN 4.3   CBC:  Recent Labs Lab 01/19/15 1120 01/20/15 0622 01/21/15 0617  WBC 23.0* 12.0* 6.5  NEUTROABS 20.9*  --   --   HGB 15.5 14.0 14.1  HCT 48.3  43.8 43.9  MCV 96.2 95.4 96.1  PLT 94* 94* 86*   CBG:  Recent Labs Lab 01/21/15 1248 01/21/15 1558 01/21/15 2036 01/22/15 0735 01/22/15 1140  GLUCAP 128* 174* 153* 141* 167*    Recent Results (from the past 240 hour(s))  Blood culture (routine x 2)     Status: None (Preliminary result)   Collection Time: 01/19/15 11:20 AM  Result Value Ref Range Status   Specimen Description BLOOD LEFT ANTECUBITAL DRAWN BY RN  Final   Special Requests   Final    BOTTLES DRAWN AEROBIC AND ANAEROBIC AEB=6CC ANA=4CC   Culture NO GROWTH < 24 HOURS  Final   Report Status PENDING  Incomplete  Blood culture (routine x 2)     Status: None (Preliminary result)   Collection Time: 01/19/15 11:25 AM  Result Value Ref Range Status   Specimen Description BLOOD RIGHT ANTECUBITAL  Final   Special Requests BOTTLES DRAWN AEROBIC ONLY 12CC  Final   Culture NO GROWTH < 24 HOURS  Final   Report Status PENDING  Incomplete  Clostridium Difficile by PCR (not at [facility])     Status: None   Collection Time: 01/20/15  7:52 PM  Result Value Ref Range Status   C difficile by pcr NEGATIVE NEGATIVE Final     Studies: Mr Tibia Fibula Right W Wo Contrast  01/21/2015   CLINICAL DATA:  Right leg cellulitis. History of left leg cellulitis.  EXAM: MRI OF LOWER RIGHT EXTREMITY WITHOUT AND WITH CONTRAST  TECHNIQUE: Multiplanar, multisequence MR imaging of the left leg was performed both before and after administration of intravenous contrast.  CONTRAST:  20mL MULTIHANCE GADOBENATE DIMEGLUMINE 529 MG/ML IV SOLN  COMPARISON:  None.  FINDINGS: There is no marrow signal abnormality. There is no acute fracture or dislocation. There is no periosteal reaction. There is no focal muscle signal abnormality. There is no abnormal muscle enhancement. There is soft tissue edema involving the right lower leg circumferentially with mild enhancement. There is no drainable fluid collection.  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  IMPRESSION: 1. Cellulitis of the right lower leg without a drainable abscess. 2. No osseous abnormality.   Electronically Signed   By: Elige Ko   On: 01/21/2015 12:29    Scheduled Meds: . carvedilol  6.25 mg Oral BID  . insulin aspart  0-15 Units Subcutaneous TID WC  . insulin aspart  0-5 Units Subcutaneous QHS  . insulin glargine  5 Units Subcutaneous QHS  . piperacillin-tazobactam (ZOSYN)  IV  3.375 g Intravenous Q8H  . rivaroxaban  20 mg Oral Q supper  . simvastatin  20 mg Oral QHS  . sodium chloride  3 mL Intravenous Q12H  . vancomycin  750 mg Intravenous Q12H   Continuous Infusions:   Principal Problem:   SIRS (systemic inflammatory response syndrome) Active Problems:   Morbid obesity   OSA (obstructive sleep apnea)   Essential hypertension, malignant   Type II diabetes mellitus with manifestations  COPD (chronic obstructive pulmonary disease) with chronic bronchitis   Chronic systolic heart failure   Chronic atrial fibrillation   S/P mitral valve replacement with bioprosthetic valve   Cellulitis   Acute renal failure    Pamella Pert  [facility] Pager [phone]. If 7PM-7AM, please contact night-coverage at [email or web address], password [facility] 01/22/2015, 4:08 PM  LOS: 3 days

## 2015-01-23 ENCOUNTER — Ambulatory Visit: Payer: 59 | Admitting: Internal Medicine

## 2015-01-23 LAB — CBC
HCT: 42.2 % (ref 39.0–52.0)
Hemoglobin: 13.9 g/dL (ref 13.0–17.0)
MCH: 30.3 pg (ref 26.0–34.0)
MCHC: 32.9 g/dL (ref 30.0–36.0)
MCV: 92.1 fL (ref 78.0–100.0)
Platelets: 120 10*3/uL — ABNORMAL LOW (ref 150–400)
RBC: 4.58 MIL/uL (ref 4.22–5.81)
RDW: 14.4 % (ref 11.5–15.5)
WBC: 5.5 10*3/uL (ref 4.0–10.5)

## 2015-01-23 LAB — BASIC METABOLIC PANEL
Anion gap: 9 (ref 5–15)
BUN: 11 mg/dL (ref 6–20)
CO2: 28 mmol/L (ref 22–32)
Calcium: 8.7 mg/dL — ABNORMAL LOW (ref 8.9–10.3)
Chloride: 102 mmol/L (ref 101–111)
Creatinine, Ser: 0.72 mg/dL (ref 0.61–1.24)
GFR calc Af Amer: >60 mL/min (ref >60)
GFR calc non Af Amer: >60 mL/min (ref >60)
Glucose, Bld: 142 mg/dL — ABNORMAL HIGH (ref 65–99)
Potassium: 3.5 mmol/L (ref 3.5–5.1)
Sodium: 139 mmol/L (ref 135–145)

## 2015-01-23 LAB — GLUCOSE, CAPILLARY
Glucose-Capillary: 137 mg/dL — ABNORMAL HIGH (ref 65–99)
Glucose-Capillary: 178 mg/dL — ABNORMAL HIGH (ref 65–99)
Glucose-Capillary: 122 mg/dL — ABNORMAL HIGH (ref 65–99)
Glucose-Capillary: 227 mg/dL — ABNORMAL HIGH (ref 65–99)

## 2015-01-23 LAB — VANCOMYCIN, TROUGH: Vancomycin Tr: 9 ug/mL — ABNORMAL LOW (ref 10.0–20.0)

## 2015-01-23 MED ORDER — SODIUM CHLORIDE 0.9 % IJ SOLN: 3.0000 mL | INTRAMUSCULAR | Status: DC | PRN

## 2015-01-23 MED ORDER — VANCOMYCIN HCL IN DEXTROSE 1-5 GM/200ML-% IV SOLN
1000.0000 mg | Freq: Two times a day (BID) | INTRAVENOUS | Status: DC
  Filled 2015-01-23 (×2): qty 200

## 2015-01-23 MED ORDER — INSULIN GLARGINE 100 UNIT/ML ~~LOC~~ SOLN
8.0000 [IU] | Freq: Every day | SUBCUTANEOUS | Status: DC
  Administered 2015-01-23 – 2015-01-25 (×3): 8 [IU] via SUBCUTANEOUS
  Filled 2015-01-23 (×4): qty 0.08

## 2015-01-23 MED ORDER — VANCOMYCIN HCL IN DEXTROSE 1-5 GM/200ML-% IV SOLN
1000.0000 mg | Freq: Two times a day (BID) | INTRAVENOUS | Status: DC
  Administered 2015-01-23 – 2015-01-25 (×6): 1000 mg via INTRAVENOUS
  Filled 2015-01-23 (×9): qty 200

## 2015-01-23 MED ORDER — SODIUM CHLORIDE 0.9 % IV SOLN
250.0000 mL | INTRAVENOUS | Status: DC | PRN
  Administered 2015-01-23: 250 mL via INTRAVENOUS

## 2015-01-23 NOTE — Progress Notes (Signed)
[facility] PROGRESS NOTE  Cody Hale ZOX:[id number] DOB: 1950/10/17 DOA: 01/19/2015 PCP: Sanda Linger, MD  Assessment/Plan: Sepsis with leukocytosis, elevated lactic acid, fever - Likely related to right lower extremity cellulitis.  - pain worse 7/16 on ambulation, obtained MRI which did not show any abscess or drainable collections - Leukocytosis resolved. max temp 99.0 - Blood cultures no growth to date, Lactic acid normalized.  - Continue IV antibiotics with vancomycin and zosyn.   Acute renal failure  - Likely related to decreased oral intake related to above.  - Resolved, continue to monitor.  Cellulitis  - History of same but on the left leg per patient report. - antibiotics as above - continues with swelling/bright red erythema and heat, although improved  Essential hypertension, malignant:  - Fair control. - home medications include coreg, lasix and lisinopril. Lisinopril remains on hold related to #2. Will continue to hold and monitor.   Chronic systolic heart failure  - remains compensated, daily weights  - Chart review indicates he saw his cardiologist last week. This note indicates his EF in 2014 was 35% and now it's 45-50%. Note indicates he's not completely compliant with diet. His Coreg was increased to 6.25 twice a day at that time. - Lasix resumed 01/22/15.   Chronic atrial fibrillation:  - Rate controlled. - Coreg as above.  - On Xarelto.  S/P mitral valve replacement with bioprosthetic valve:  - Stable. On Xarelto  Type II diabetes mellitus with manifestations: CBG range 137-162.  Appetite remains unreliable. Continue long-acting insulin  But will increase dose slightly as his appetite is unreliable. SSI. A1c in May 7.3.  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   Morbid obesity: BMI is 37.5 nutritional consult  OSA (obstructive sleep apnea): CPAP    Thrombocytopenia - in the setting of sepsis, also he has a component of chronic thrombocytopenia - trending upward - no sign s/sx bleeding    Code Status: full Family Communication: none present Disposition Plan: home hopefully 2 days or so   Consultants:  none  Procedures:  none  Antibiotics:  Vancomycin 01/19/15>>  Zosyn 01/20/15>>   HPI/Subjective: Up in chair. Reports feeling "a little better but leg still red and big".  Objective: Filed Vitals:   01/23/15 0645  BP: 133/62  Pulse: 76  Temp: 98.8 F (37.1 C)  Resp: 20    Intake/Output Summary (Last 24 hours) at 01/23/15 0911 Last data filed at 01/23/15 8119  Gross per 24 hour  Intake    720 ml  Output   3700 ml  Net  -2980 ml   Filed Weights   01/21/15 0555 01/22/15 0451 01/23/15 0645  Weight: 118.428 kg (261 lb 1.4 oz) 122.607 kg (270 lb 4.8 oz) 121.6 kg (268 lb 1.3 oz)   Exam:  General:  Well nourished appears comfortable  Cardiovascular: irregularly irregular, right lower leg with erythema bright red, from just below knee extending medial aspect ankle and around lateral aspect calf. Skin somewhat shiny and warm. 1+ edema  Respiratory: normal effort BS clear bilaterally no wheeze  Abdomen: obese soft +BS non-tender  Musculoskeletal: joints without swelling/erythema   Data Reviewed: Basic Metabolic Panel:  Recent Labs Lab 01/19/15 1120 01/20/15 0622 01/21/15 0617 01/23/15 0710  NA 138 138 137 139  K 4.9 3.8 3.9 3.5  CL 99* 105 104 102  CO2 29 25 27 28   GLUCOSE 194* 127* 125* 142*  BUN 19 17 16 11   CREATININE  1.30* 1.03 0.96 0.72  CALCIUM 9.2 8.2* 8.1* 8.7*   Liver Function Tests:  Recent Labs Lab 01/19/15 1120  AST 25  ALT 24  ALKPHOS 58  BILITOT 1.7*  PROT 7.4  ALBUMIN 4.3   CBC:  Recent Labs Lab 01/19/15 1120 01/20/15 0622 01/21/15 0617 01/23/15 0710  WBC 23.0* 12.0* 6.5 5.5  NEUTROABS 20.9*  --   --   --   HGB 15.5 14.0 14.1 13.9  HCT 48.3 43.8 43.9 42.2   MCV 96.2 95.4 96.1 92.1  PLT 94* 94* 86* 120*   CBG:  Recent Labs Lab 01/22/15 0735 01/22/15 1140 01/22/15 1645 01/22/15 2148 01/23/15 0735  GLUCAP 141* 167* 132* 162* 137*    Recent Results (from the past 240 hour(s))  Blood culture (routine x 2)     Status: None (Preliminary result)   Collection Time: 01/19/15 11:20 AM  Result Value Ref Range Status   Specimen Description BLOOD LEFT ANTECUBITAL DRAWN BY RN  Final   Special Requests   Final    BOTTLES DRAWN AEROBIC AND ANAEROBIC AEB=6CC ANA=4CC   Culture NO GROWTH 4 DAYS  Final   Report Status PENDING  Incomplete  Blood culture (routine x 2)     Status: None (Preliminary result)   Collection Time: 01/19/15 11:25 AM  Result Value Ref Range Status   Specimen Description BLOOD RIGHT ANTECUBITAL  Final   Special Requests BOTTLES DRAWN AEROBIC ONLY 12CC  Final   Culture NO GROWTH 4 DAYS  Final   Report Status PENDING  Incomplete  Clostridium Difficile by PCR (not at [facility])     Status: None   Collection Time: 01/20/15  7:52 PM  Result Value Ref Range Status   C difficile by pcr NEGATIVE NEGATIVE Final     Studies: Mr Tibia Fibula Right W Wo Contrast  01/21/2015   CLINICAL DATA:  Right leg cellulitis. History of left leg cellulitis.  EXAM: MRI OF LOWER RIGHT EXTREMITY WITHOUT AND WITH CONTRAST  TECHNIQUE: Multiplanar, multisequence MR imaging of the left leg was performed both before and after administration of intravenous contrast.  CONTRAST:  63mL MULTIHANCE GADOBENATE DIMEGLUMINE 529 MG/ML IV SOLN  COMPARISON:  None.  FINDINGS: There is no marrow signal abnormality. There is no acute fracture or dislocation. There is no periosteal reaction. There is no focal muscle signal abnormality. There is no abnormal muscle enhancement. There is soft tissue edema involving the right lower leg circumferentially with mild enhancement. There is no drainable fluid collection.  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  IMPRESSION: 1. Cellulitis of the right lower leg without a drainable abscess. 2. No osseous abnormality.   Electronically Signed   By: Elige Ko   On: 01/21/2015 12:29    Scheduled Meds: . carvedilol  6.25 mg Oral BID  . furosemide  40 mg Oral BID  . insulin aspart  0-15 Units Subcutaneous TID WC  . insulin aspart  0-5 Units Subcutaneous QHS  . insulin glargine  5 Units Subcutaneous QHS  . piperacillin-tazobactam (ZOSYN)  IV  3.375 g Intravenous Q8H  . rivaroxaban  20 mg Oral Q supper  . simvastatin  20 mg Oral QHS  . sodium chloride  3 mL Intravenous Q12H  . vancomycin  750 mg Intravenous Q12H   Continuous Infusions:   Principal Problem:   SIRS (systemic inflammatory response syndrome) Active Problems:   Morbid obesity   OSA (obstructive sleep apnea)   Essential hypertension, malignant  Type II diabetes mellitus with manifestations   COPD (chronic obstructive pulmonary disease) with chronic bronchitis   Chronic systolic heart failure   Chronic atrial fibrillation   S/P mitral valve replacement with bioprosthetic valve   Cellulitis   Acute renal failure   [facility]  [facility] Pager [phone]. If 7PM-7AM, please contact night-coverage at [email or web address], password [facility] 01/23/2015, 9:11 AM  LOS: 4 days    Patient was seen, examined,treatment plan was discussed with the Advance Practice Provider.  I have directly reviewed the clinical findings, lab, imaging studies and management of this patient in detail. I have made the necessary changes to the above noted documentation, and agree with the documentation, as recorded by the Advance Practice Provider.   Pamella Pert, MD [facility] [phone]

## 2015-01-23 NOTE — Progress Notes (Signed)
ANTIBIOTIC CONSULT NOTE  Pharmacy Consult for Vancomycin and Zosyn Indication: cellulitis  Allergies  Allergen Reactions  . Daptomycin Rash  . Tape Rash and Other (See Comments)    Adhesive Tape-Burn skin.   Patient Measurements: Height: 5\' 10"  (177.8 cm) Weight: 268 lb 1.3 oz (121.6 kg) IBW/kg (Calculated) : 73  Vital Signs: Temp: 98.8 F (37.1 C) (07/18 0645) Temp Source: Oral (07/18 0645) BP: 133/62 mmHg (07/18 0645) Pulse Rate: 76 (07/18 0645) Intake/Output from previous day: 07/17 0701 - 07/18 0700 In: 720 [P.O.:720] Out: 2500 [Urine:2500] Intake/Output from this shift: Total I/O In: 240 [P.O.:240] Out: 1200 [Urine:1200]  Labs:  Recent Labs  01/21/15 0617 01/23/15 0710  WBC 6.5 5.5  HGB 14.1 13.9  PLT 86* 120*  CREATININE 0.96 0.72   Estimated Creatinine Clearance: 121.9 mL/min (by C-G formula based on Cr of 0.72).  Recent Labs  01/23/15 0848  VANCOTROUGH 9*    Microbiology: Recent Results (from the past 720 hour(s))  Blood culture (routine x 2)     Status: None (Preliminary result)   Collection Time: 01/19/15 11:20 AM  Result Value Ref Range Status   Specimen Description BLOOD LEFT ANTECUBITAL DRAWN BY RN  Final   Special Requests   Final    BOTTLES DRAWN AEROBIC AND ANAEROBIC AEB=6CC ANA=4CC   Culture NO GROWTH 4 DAYS  Final   Report Status PENDING  Incomplete  Blood culture (routine x 2)     Status: None (Preliminary result)   Collection Time: 01/19/15 11:25 AM  Result Value Ref Range Status   Specimen Description BLOOD RIGHT ANTECUBITAL  Final   Special Requests BOTTLES DRAWN AEROBIC ONLY 12CC  Final   Culture NO GROWTH 4 DAYS  Final   Report Status PENDING  Incomplete  Clostridium Difficile by PCR (not at [facility])     Status: None   Collection Time: 01/20/15  7:52 PM  Result Value Ref Range Status   C difficile by pcr NEGATIVE NEGATIVE Final    Anti-infectives    Start     Dose/Rate Route Frequency Ordered Stop   01/23/15 1800   vancomycin (VANCOCIN) IVPB 1000 mg/200 mL premix     1,000 mg 200 mL/hr over 60 Minutes Intravenous Every 12 hours 01/23/15 0950     01/20/15 1100  piperacillin-tazobactam (ZOSYN) IVPB 3.375 g     3.375 g 12.5 mL/hr over 240 Minutes Intravenous Every 8 hours 01/20/15 0944     01/20/15 1000  vancomycin (VANCOCIN) 1,500 mg in sodium chloride 0.9 % 500 mL IVPB  Status:  Discontinued     1,500 mg 250 mL/hr over 120 Minutes Intravenous Every 24 hours 01/19/15 1703 01/20/15 0943   01/20/15 1000  vancomycin (VANCOCIN) IVPB 750 mg/150 ml premix     750 mg 150 mL/hr over 60 Minutes Intravenous Every 12 hours 01/20/15 0944 01/23/15 1659   01/19/15 1215  vancomycin (VANCOCIN) 2,000 mg in sodium chloride 0.9 % 500 mL IVPB     2,000 mg 250 mL/hr over 120 Minutes Intravenous  Once 01/19/15 1200 01/19/15 1434     Assessment: 64 yo obese M admitted with altered mental status & fever (Tm 102.6 F).  WBC & lactic acid levels also elevated.  He is noted to have new erythematous region on the right inner calf as possible source.  SCr has improved since ABX initiated.  Vancomycin initiated for cellulits.  Trough level is below target.  Goal of Therapy:  Vancomycin trough level 10-15 mcg/ml  Plan: Zosyn 3.375gm  IV q8h, each dose over 4 hrs Increase Vancomycin to  IV q12hrs  Check Vancomycin trough level weekly or sooner if warranted Duration of therapy per MD, deescalate ABX when appropriate Monitor renal function and cx data   Valrie Hart A 01/23/2015,9:50 AM

## 2015-01-24 ENCOUNTER — Ambulatory Visit: Payer: Commercial Managed Care - HMO | Admitting: Internal Medicine

## 2015-01-24 ENCOUNTER — Inpatient Hospital Stay (HOSPITAL_COMMUNITY): Payer: Commercial Managed Care - HMO

## 2015-01-24 LAB — BASIC METABOLIC PANEL
Anion gap: 9 (ref 5–15)
BUN: 10 mg/dL (ref 6–20)
CO2: 30 mmol/L (ref 22–32)
Calcium: 8.7 mg/dL — ABNORMAL LOW (ref 8.9–10.3)
Chloride: 100 mmol/L — ABNORMAL LOW (ref 101–111)
Creatinine, Ser: 0.78 mg/dL (ref 0.61–1.24)
GFR calc Af Amer: >60 mL/min (ref >60)
GFR calc non Af Amer: >60 mL/min (ref >60)
Glucose, Bld: 165 mg/dL — ABNORMAL HIGH (ref 65–99)
Potassium: 3.6 mmol/L (ref 3.5–5.1)
Sodium: 139 mmol/L (ref 135–145)

## 2015-01-24 LAB — CULTURE, BLOOD (ROUTINE X 2)
Culture: NO GROWTH
Culture: NO GROWTH

## 2015-01-24 LAB — GLUCOSE, CAPILLARY
Glucose-Capillary: 146 mg/dL — ABNORMAL HIGH (ref 65–99)
Glucose-Capillary: 165 mg/dL — ABNORMAL HIGH (ref 65–99)
Glucose-Capillary: 135 mg/dL — ABNORMAL HIGH (ref 65–99)

## 2015-01-24 LAB — CBC
HCT: 44.6 % (ref 39.0–52.0)
Hemoglobin: 14.8 g/dL (ref 13.0–17.0)
MCH: 30.6 pg (ref 26.0–34.0)
MCHC: 33.2 g/dL (ref 30.0–36.0)
MCV: 92.3 fL (ref 78.0–100.0)
Platelets: 138 10*3/uL — ABNORMAL LOW (ref 150–400)
RBC: 4.83 MIL/uL (ref 4.22–5.81)
RDW: 14.4 % (ref 11.5–15.5)
WBC: 5.3 10*3/uL (ref 4.0–10.5)

## 2015-01-24 NOTE — Progress Notes (Signed)
[facility] PROGRESS NOTE  Cody Hale QDI:[id number] DOB: August 27, 1950 DOA: 01/19/2015 PCP: Sanda Linger, MD   Subjective: Up in chair. Reports increased pain right leg  Assessment/Plan: Sepsis with leukocytosis, elevated lactic acid, fever - Likely related to right lower extremity cellulitis.  - painful night. Has not been asking for pain meds. Exam reveals increased swelling right foot with tightness. Obtained MRI which did not show any abscess or drainable collections - Leukocytosis resolved.max temp 99.0 - Blood cultures no growth to date, Lactic acid normalized.  - Continue IV antibiotics with vancomycin and zosyn.  - given swelling, US venous was done which was without evidence of DVT  Acute renal failure  - Likely related to decreased oral intake related to above.  - Resolved, continue to monitor.  Cellulitis  - History of same but on the left leg per patient report. - antibiotics as above - continues with swelling/bright red erythema and heat, increased swelling foot/ankle tenderness   Essential hypertension, malignant:  - Fair control. - home medications include coreg, lasix and lisinopril. Lisinopril remains on hold related to #2. Will continue to hold and monitor.   Chronic systolic heart failure  - remains compensated,  Volume -5.5L  - Chart review indicates he saw his cardiologist last week. This note indicates his EF in 2014 was 35% and now it's 45-50%. Note indicates he's not completely compliant with diet. His Coreg was increased to 6.25 twice a day at that time. - Lasix resumed 01/22/15.   Chronic atrial fibrillation:  - Rate controlled. - Coreg as above.  - On Xarelto.  S/P mitral valve replacement with bioprosthetic valve:  - Stable. On Xarelto  Type II diabetes mellitus with manifestations: CBG range 146-227. Appetite remains unreliable. Continue long-acting insulin But will increase dose slightly as his appetite is  unreliable. SSI. A1c in May 7.3.  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   Morbid obesity:  - BMI is 37.5 nutritional consult  OSA (obstructive sleep apnea): CPAP   Thrombocytopenia - in the setting of sepsis, also he has a component of chronic thrombocytopenia -  Continues to trend upward - no sign s/sx bleeding  Code Status: full Family Communication: none present Disposition Plan: home when ready  Consultants:  none  Procedures:  none  Antibiotics:  Vancomycin 01/19/15>>  Zosyn 01/20/15>>  Objective: Filed Vitals:   01/24/15 0613  BP: 155/66  Pulse: 79  Temp: 97.9 F (36.6 C)  Resp: 20    Intake/Output Summary (Last 24 hours) at 01/24/15 0856 Last data filed at 01/24/15 0612  Gross per 24 hour  Intake   1270 ml  Output   2750 ml  Net  -1480 ml   Filed Weights   01/22/15 0451 01/23/15 0645 01/24/15 0613  Weight: 122.607 kg (270 lb 4.8 oz) 121.6 kg (268 lb 1.3 oz) 120.8 kg (266 lb 5.1 oz)   Exam:  General:  Well nourished   Cardiovascular: irregularly irregular no MGR PPP  Respiratory: normal effort BS clear to ausculation bilaterally no rhonci or crackles  Abdomen: non-distended non-tender +BS no guarding  Musculoskeletal: joints without swelling/erythema  Skin: right leg with erythema extending from below knee to foot/ankle with swelling/tenderness and heat   Data Reviewed: Basic Metabolic Panel:  Recent Labs Lab 01/19/15 1120 01/20/15 0622 01/21/15 0617 01/23/15 0710  NA 138 138 137 139  K 4.9 3.8 3.9 3.5  CL 99* 105 104 102  CO2 29 25 27  28  GLUCOSE 194* 127* 125* 142*  BUN CREATININE 1.30* 1.03 0.96 0.72  CALCIUM 9.2 8.2* 8.1* 8.7*   Liver Function Tests:  Recent Labs Lab 01/19/15 1120  AST 25  ALT 24  ALKPHOS 58  BILITOT 1.7*  PROT 7.4  ALBUMIN 4.3   CBC:  Recent Labs Lab 01/19/15 1120 01/20/15 0622 01/21/15 0617 01/23/15 0710  01/24/15 0818  WBC 23.0* 12.0* 6.5 5.5 5.3  NEUTROABS 20.9*  --   --   --   --   HGB 15.5 14.0 14.1 13.9 14.8  HCT 48.3 43.8 43.9 42.2 44.6  MCV 96.2 95.4 96.1 92.1 92.3  PLT 94* 94* 86* 120* 138*   CBG:  Recent Labs Lab 01/23/15 0735 01/23/15 1120 01/23/15 1614 01/23/15 2003 01/24/15 0720  GLUCAP 137* 178* 122* 227* 146*    Recent Results (from the past 240 hour(s))  Blood culture (routine x 2)     Status: None (Preliminary result)   Collection Time: 01/19/15 11:20 AM  Result Value Ref Range Status   Specimen Description BLOOD LEFT ANTECUBITAL DRAWN BY RN  Final   Special Requests   Final    BOTTLES DRAWN AEROBIC AND ANAEROBIC AEB=6CC ANA=4CC   Culture NO GROWTH 4 DAYS  Final   Report Status PENDING  Incomplete  Blood culture (routine x 2)     Status: None (Preliminary result)   Collection Time: 01/19/15 11:25 AM  Result Value Ref Range Status   Specimen Description BLOOD RIGHT ANTECUBITAL  Final   Special Requests BOTTLES DRAWN AEROBIC ONLY 12CC  Final   Culture NO GROWTH 4 DAYS  Final   Report Status PENDING  Incomplete  Clostridium Difficile by PCR (not at [facility])     Status: None   Collection Time: 01/20/15  7:52 PM  Result Value Ref Range Status   C difficile by pcr NEGATIVE NEGATIVE Final     Studies: No results found.  Scheduled Meds: . carvedilol  6.25 mg Oral BID  . furosemide  40 mg Oral BID  . insulin aspart  0-15 Units Subcutaneous TID WC  . insulin aspart  0-5 Units Subcutaneous QHS  . insulin glargine  8 Units Subcutaneous QHS  . piperacillin-tazobactam (ZOSYN)  IV  3.375 g Intravenous Q8H  . rivaroxaban  20 mg Oral Q supper  . simvastatin  20 mg Oral QHS  . sodium chloride  3 mL Intravenous Q12H  . vancomycin  1,000 mg Intravenous Q12H   Continuous Infusions:   Principal Problem:   SIRS (systemic inflammatory response syndrome) Active Problems:   Morbid obesity   OSA (obstructive sleep apnea)   Essential hypertension, malignant   Type II  diabetes mellitus with manifestations   COPD (chronic obstructive pulmonary disease) with chronic bronchitis   Chronic systolic heart failure   Chronic atrial fibrillation   S/P mitral valve replacement with bioprosthetic valve   Cellulitis   Acute renal failure  [facility]  [facility] Pager [phone]. If 7PM-7AM, please contact night-coverage at [email or web address], password [facility] 01/24/2015, 8:56 AM  LOS: 5 days   Patient was seen, examined,treatment plan was discussed with the Advance Practice Provider.  I have directly reviewed the clinical findings, lab, imaging studies and management of this patient in detail. I have made the necessary changes to the above noted documentation, and agree with the documentation, as recorded by the Advance Practice Provider.   Time spent: 25 minutes  Pamella Pert, MD [facility] [phone]

## 2015-01-24 NOTE — Care Management Note (Signed)
Case Management Note  Patient Details  Name: Cody Hale MRN: [id number] Date of Birth: Jun 18, 1951  Subjective/Objective:                    Action/Plan:   Expected Discharge Date:                  Expected Discharge Plan:  Home/Self Care  In-House Referral:  NA  Discharge planning Services  CM Consult  Post Acute Care Choice:  NA Choice offered to:  NA  DME Arranged:    DME Agency:     HH Arranged:    HH Agency:     Status of Service:  Completed, signed off  Medicare Important Message Given:  Yes-second notification given Date Medicare IM Given:    Medicare IM give by:    Date Additional Medicare IM Given:    Additional Medicare Important Message give by:     If discussed at Long Length of Stay Meetings, dates discussed:  01/24/15  Additional Comments:  Cheryl Flash, RN 01/24/2015, 2:29 PM

## 2015-01-24 NOTE — Clinical Social Work Note (Signed)
Clinical Social Work Assessment  Patient Details  Name: Cody Hale MRN: [id number] Date of Birth: 1951-03-24  Date of referral:  01/24/15               Reason for consult:  Facility Placement                Permission sought to share information with:    Permission granted to share information::  Yes, Verbal Permission Granted  Name::     Cody Hale::     Relationship::  daughter  Contact Information:     Housing/Transportation Living arrangements for the past 2 months:  Apartment Source of Information:  Patient, Adult Children Patient Interpreter Needed:  None Criminal Activity/Legal Involvement Pertinent to Current Situation/Hospitalization:  No - Comment as needed Significant Relationships:  Adult Children, Spouse Lives with:  Spouse Do you feel safe going back to the place where you live?  Yes Need for family participation in patient care:  Yes (Comment)  Care giving concerns:  Pt admits that they need to be pursuing ALF soon.    Social Worker assessment / plan:  CSW met with pt at bedside following referral from CM who reports daughter was requesting ALF for pt. Pt alert and oriented and states he lives with his wife of 30 some years. They have a small apartment. Pt indicates that he is fairly independent, but his wife requires some care. He is concerned about her now while he is in hospital, although he does have people checking on her. Pt uses a cane when needed and still drives. CSW discussed ALF option and pt said that this was something they had been talking about together. He said that their daughter is very involved and has been trying to help with placement as she feels this is becoming necessary for her parents. Pt agrees, but does not want to be placed until his wife can go as well. Cody has applied for Medicaid and discussed with their PCP about an FL2. Pt states it is too hard to be separated from his wife, particularly when he does not know for how  long. He requests to return home and continue process together. Pt and daughter agreeable to home health RN and SW. ALF list provided. Above discussed with Cody as well. CM notified. CSW will sign off, but can be reconsulted if needed.   Employment status:  Disabled (Comment on whether or not currently receiving Disability) Insurance information:  Managed Medicare PT Recommendations:  Not assessed at this time Information / Referral to community resources:  Other (Comment Required) (ALF list)  Patient/Family's Response to care:  Pt refuses to consider ALF from hospital until his wife is able to go with him.   Patient/Family's Understanding of and Emotional Response to Diagnosis, Current Treatment, and Prognosis:  Pt and daughter appear aware of admission diagnosis and treatment plan. They understand that pt may d/c tomorrow.  Emotional Assessment Appearance:  Appears older than stated age Attitude/Demeanor/Rapport:  Other (Cooperative) Affect (typically observed):  Appropriate Orientation:  Oriented to Self, Oriented to Place, Oriented to  Time, Oriented to Situation Alcohol / Substance use:  Not Applicable Psych involvement (Current and /or in the community):  No (Comment)  Discharge Needs  Concerns to be addressed:  Discharge Planning Concerns Readmission within the last 30 days:  No Current discharge risk:  None Barriers to Discharge:  No Barriers Identified   Salome Arnt, [place] 01/24/2015, 2:09 PM [phone]

## 2015-01-25 DIAGNOSIS — L03115 Cellulitis of right lower limb: Secondary | ICD-10-CM

## 2015-01-25 LAB — GLUCOSE, CAPILLARY
Glucose-Capillary: 213 mg/dL — ABNORMAL HIGH (ref 65–99)
Glucose-Capillary: 146 mg/dL — ABNORMAL HIGH (ref 65–99)
Glucose-Capillary: 164 mg/dL — ABNORMAL HIGH (ref 65–99)
Glucose-Capillary: 128 mg/dL — ABNORMAL HIGH (ref 65–99)
Glucose-Capillary: 202 mg/dL — ABNORMAL HIGH (ref 65–99)

## 2015-01-25 LAB — CBC
HCT: 44.1 % (ref 39.0–52.0)
Hemoglobin: 14.4 g/dL (ref 13.0–17.0)
MCH: 30.3 pg (ref 26.0–34.0)
MCHC: 32.7 g/dL (ref 30.0–36.0)
MCV: 92.8 fL (ref 78.0–100.0)
Platelets: 141 10*3/uL — ABNORMAL LOW (ref 150–400)
RBC: 4.75 MIL/uL (ref 4.22–5.81)
RDW: 14.3 % (ref 11.5–15.5)
WBC: 5.1 10*3/uL (ref 4.0–10.5)

## 2015-01-25 LAB — BASIC METABOLIC PANEL
Anion gap: 9 (ref 5–15)
BUN: 11 mg/dL (ref 6–20)
CO2: 31 mmol/L (ref 22–32)
Calcium: 8.8 mg/dL — ABNORMAL LOW (ref 8.9–10.3)
Chloride: 100 mmol/L — ABNORMAL LOW (ref 101–111)
Creatinine, Ser: 0.8 mg/dL (ref 0.61–1.24)
GFR calc Af Amer: >60 mL/min (ref >60)
GFR calc non Af Amer: >60 mL/min (ref >60)
Glucose, Bld: 152 mg/dL — ABNORMAL HIGH (ref 65–99)
Potassium: 3.3 mmol/L — ABNORMAL LOW (ref 3.5–5.1)
Sodium: 140 mmol/L (ref 135–145)

## 2015-01-25 LAB — MAGNESIUM: Magnesium: 2 mg/dL (ref 1.7–2.4)

## 2015-01-25 MED ORDER — POTASSIUM CHLORIDE CRYS ER 20 MEQ PO TBCR
40.0000 meq | EXTENDED_RELEASE_TABLET | Freq: Every day | ORAL | Status: DC
  Administered 2015-01-26: 40 meq via ORAL
  Filled 2015-01-25: qty 2

## 2015-01-25 MED ORDER — POTASSIUM CHLORIDE CRYS ER 20 MEQ PO TBCR
40.0000 meq | EXTENDED_RELEASE_TABLET | ORAL | Status: AC
  Administered 2015-01-25 (×2): 40 meq via ORAL
  Filled 2015-01-25 (×2): qty 2

## 2015-01-25 NOTE — Progress Notes (Signed)
PT Screening Note:  Patient Details Name: Cody Hale MRN: [id number] DOB: Feb 08, 1951   PT Screening:    Reason Eval/Treat Not Completed: PT screened, no needs identified, will sign off: Pt screened. Pt denies any major CC at this time. Pt denies pain, received up in chair, transferred with supervision and ambulated 471ft at supervision without LOB or distress. Pt reports he feels like he is almost back to baseline. No skilled services required at this time. Pt is appropriate for DC to home. Not recommending any additional services of DME at this time.    Buccola,Allan C 01/25/2015, 11:26 AM 11:28 AM  Rosamaria Lints, PT, DPT [place] License # [id number]

## 2015-01-25 NOTE — Progress Notes (Signed)
[facility] PROGRESS NOTE  Cody Hale UJW:[id number] DOB: June 04, 1951 DOA: 01/19/2015 PCP: Sanda Linger, MD  Assessment/Plan: Sepsis with leukocytosis, elevated lactic acid, fever - Likely related to right lower extremity cellulitis. Resolved. MRI did not show any abscess or drainable collections. Blood cultures no growth to date. Continue IV antibiotics with vancomycin and zosyn. Acute renal failure  - Likely related to decreased oral intake related to above.  - Resolved  Cellulitis  - History of same but on the left leg.Marland Kitchen Appears much improved today. Will continue IV  Antibiotics for 24 more hours. Anticipate transition to oral tomorrow. Less swelling, less erythema and less tenderness  US venous was done which was without evidence of DVT. Ambulated in hall today with minimal assist  Essential hypertension, malignant:  - Fair control.  Chronic systolic heart failure  - remains compensated, Volume -6.8L  - Chart review indicates he saw his cardiologist last week. This note indicates his EF in 2014 was 35% and now it's 45-50%. Note indicates he's not completely compliant with diet. His Coreg was increased to 6.25 twice a day at that time.    Chronic atrial fibrillation:  - Rate controlled. - Coreg as above.  - On Xarelto.  S/P mitral valve replacement with bioprosthetic valve:  - Stable. On Xarelto  Type II diabetes mellitus with manifestations: CBG range 146-164. Appetite improving.  Continue long-acting insulin. And SSI. A1c in May 7.3.  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   Morbid obesity:  - BMI is 37.5 nutritional consult  OSA (obstructive sleep apnea): CPAP   Thrombocytopenia - in the setting of sepsis, also he has a component of chronic thrombocytopenia - Continues to trend upward - no sign s/sx bleeding   Code Status: full Family Communication: none  present Disposition Plan: home likely tomorrow   Consultants:  none  Procedures:  none  Antibiotics:  Vancomycin 01/19/15>>  Zosyn 01/20/15>>   HPI/Subjective: Sitting in chair. Reports less right leg pain and feeling better overall  Objective: Filed Vitals:   01/25/15 0551  BP: 145/74  Pulse: 76  Temp: 97.8 F (36.6 C)  Resp: 14    Intake/Output Summary (Last 24 hours) at 01/25/15 1406 Last data filed at 01/25/15 0800  Gross per 24 hour  Intake    600 ml  Output   2250 ml  Net  -1650 ml   Filed Weights   01/23/15 0645 01/24/15 0613 01/25/15 0551  Weight: 121.6 kg (268 lb 1.3 oz) 120.8 kg (266 lb 5.1 oz) 117.482 kg (259 lb)    Exam:   General:  Appears comfortable and well  Cardiovascular: irregularly irregular no m/g/r   Respiratory: normal effort BS clear bilaterally no wheeze no crakles  Abdomen: obese soft +BS non-tender to palpation  Musculoskeletal: no clubbing or cyanosis  Skin: right leg with less erythema retracting from borders marked particularly at right foot/ankle, less swelling of right LE an less tenderness but remains warm to touch and somewhat tender medial aspect.   Data Reviewed: Basic Metabolic Panel:  Recent Labs Lab 01/20/15 0622 01/21/15 0617 01/23/15 0710 01/24/15 0818 01/25/15 0633  NA 138 137 139 139 140  K 3.8 3.9 3.5 3.6 3.3*  CL 105 104 102 100* 100*  CO2 25 27 28 30 31   GLUCOSE 127* 125* 142* 165* 152*  BUN 17 16 11 10 11   CREATININE 1.03 0.96 0.72 0.78 0.80  CALCIUM 8.2* 8.1* 8.7* 8.7* 8.8*   Liver  Function Tests:  Recent Labs Lab 01/19/15 1120  AST 25  ALT 24  ALKPHOS 58  BILITOT 1.7*  PROT 7.4  ALBUMIN 4.3   No results for input(s): LIPASE, AMYLASE in the last 168 hours. No results for input(s): AMMONIA in the last 168 hours. CBC:  Recent Labs Lab 01/19/15 1120 01/20/15 0622 01/21/15 0617 01/23/15 0710 01/24/15 0818 01/25/15 0633  WBC 23.0* 12.0* 6.5 5.5 5.3 5.1  NEUTROABS 20.9*  --    --   --   --   --   HGB 15.5 14.0 14.1 13.9 14.8 14.4  HCT 48.3 43.8 43.9 42.2 44.6 44.1  MCV 96.2 95.4 96.1 92.1 92.3 92.8  PLT 94* 94* 86* 120* 138* 141*   Cardiac Enzymes: No results for input(s): CKTOTAL, CKMB, CKMBINDEX, TROPONINI in the last 168 hours. BNP (last 3 results) No results for input(s): BNP in the last 8760 hours.  ProBNP (last 3 results) No results for input(s): PROBNP in the last 8760 hours.  CBG:  Recent Labs Lab 01/24/15 1118 01/24/15 1619 01/24/15 2118 01/25/15 0733 01/25/15 1117  GLUCAP 165* 135* 213* 146* 164*    Recent Results (from the past 240 hour(s))  Blood culture (routine x 2)     Status: None   Collection Time: 01/19/15 11:20 AM  Result Value Ref Range Status   Specimen Description BLOOD LEFT ANTECUBITAL DRAWN BY RN  Final   Special Requests   Final    BOTTLES DRAWN AEROBIC AND ANAEROBIC AEB=6CC ANA=4CC   Culture NO GROWTH 5 DAYS  Final   Report Status 01/24/2015 FINAL  Final  Blood culture (routine x 2)     Status: None   Collection Time: 01/19/15 11:25 AM  Result Value Ref Range Status   Specimen Description BLOOD RIGHT ANTECUBITAL  Final   Special Requests BOTTLES DRAWN AEROBIC ONLY 12CC  Final   Culture NO GROWTH 5 DAYS  Final   Report Status 01/24/2015 FINAL  Final  Clostridium Difficile by PCR (not at [facility])     Status: None   Collection Time: 01/20/15  7:52 PM  Result Value Ref Range Status   C difficile by pcr NEGATIVE NEGATIVE Final     Studies: US Venous Img Lower Unilateral Right  01/24/2015   CLINICAL DATA:  Swelling.  EXAM: RIGHT LOWER EXTREMITY VENOUS DOPPLER ULTRASOUND  TECHNIQUE: Gray-scale sonography with graded compression, as well as color Doppler and duplex ultrasound were performed to evaluate the lower extremity deep venous systems from the level of the common femoral vein and including the common femoral, femoral, profunda femoral, popliteal and calf veins including the posterior tibial, peroneal and gastrocnemius  veins when visible. The superficial great saphenous vein was also interrogated. Spectral Doppler was utilized to evaluate flow at rest and with distal augmentation maneuvers in the common femoral, femoral and popliteal veins.  COMPARISON:  MRI 01/21/2015.  FINDINGS: Contralateral Common Femoral Vein: Respiratory phasicity is normal and symmetric with the symptomatic side. No evidence of thrombus. Normal compressibility.  Common Femoral Vein: No evidence of thrombus. Normal compressibility, respiratory phasicity and response to augmentation.  Saphenofemoral Junction: No evidence of thrombus. Normal compressibility and flow on color Doppler imaging.  Profunda Femoral Vein: No evidence of thrombus. Normal compressibility and flow on color Doppler imaging.  Femoral Vein: No evidence of thrombus. Normal compressibility, respiratory phasicity and response to augmentation.  Popliteal Vein: No evidence of thrombus. Normal compressibility, respiratory phasicity and response to augmentation.  Calf Veins: No evidence of thrombus. Normal compressibility  and flow on color Doppler imaging.  Superficial Great Saphenous Vein: No evidence of thrombus. Normal compressibility and flow on color Doppler imaging.  Venous Reflux:  None.  Other Findings:  Soft tissue edema noted distally.  IMPRESSION: No evidence of deep venous thrombosis.   Electronically Signed   By: Maisie Fus  Register   On: 01/24/2015 16:04    Scheduled Meds: . carvedilol  6.25 mg Oral BID  . furosemide  40 mg Oral BID  . insulin aspart  0-15 Units Subcutaneous TID WC  . insulin aspart  0-5 Units Subcutaneous QHS  . insulin glargine  8 Units Subcutaneous QHS  . piperacillin-tazobactam (ZOSYN)  IV  3.375 g Intravenous Q8H  . rivaroxaban  20 mg Oral Q supper  . simvastatin  20 mg Oral QHS  . sodium chloride  3 mL Intravenous Q12H  . vancomycin  1,000 mg Intravenous Q12H   Continuous Infusions:   Principal Problem:   SIRS (systemic inflammatory response  syndrome) Active Problems:   Morbid obesity   OSA (obstructive sleep apnea)   Essential hypertension, malignant   Type II diabetes mellitus with manifestations   COPD (chronic obstructive pulmonary disease) with chronic bronchitis   Chronic systolic heart failure   Chronic atrial fibrillation   S/P mitral valve replacement with bioprosthetic valve   Cellulitis   Acute renal failure    Time spent: 30 minutes    [facility]  [facility] Pager [phone]. If 7PM-7AM, please contact night-coverage at [email or web address], password [facility] 01/25/2015, 2:06 PM  LOS: 6 days

## 2015-01-26 DIAGNOSIS — I5022 Chronic systolic (congestive) heart failure: Secondary | ICD-10-CM

## 2015-01-26 DIAGNOSIS — I482 Chronic atrial fibrillation: Secondary | ICD-10-CM

## 2015-01-26 LAB — GLUCOSE, CAPILLARY
Glucose-Capillary: 142 mg/dL — ABNORMAL HIGH (ref 65–99)
Glucose-Capillary: 156 mg/dL — ABNORMAL HIGH (ref 65–99)

## 2015-01-26 LAB — BASIC METABOLIC PANEL
Anion gap: 8 (ref 5–15)
BUN: 14 mg/dL (ref 6–20)
CO2: 30 mmol/L (ref 22–32)
Calcium: 8.8 mg/dL — ABNORMAL LOW (ref 8.9–10.3)
Chloride: 101 mmol/L (ref 101–111)
Creatinine, Ser: 0.78 mg/dL (ref 0.61–1.24)
GFR calc Af Amer: >60 mL/min (ref >60)
GFR calc non Af Amer: >60 mL/min (ref >60)
Glucose, Bld: 152 mg/dL — ABNORMAL HIGH (ref 65–99)
Potassium: 4.1 mmol/L (ref 3.5–5.1)
Sodium: 139 mmol/L (ref 135–145)

## 2015-01-26 MED ORDER — DOXYCYCLINE HYCLATE 100 MG PO CAPS: 100.0000 mg | ORAL_CAPSULE | Freq: Two times a day (BID) | ORAL | Status: DC

## 2015-01-26 MED ORDER — POTASSIUM CHLORIDE CRYS ER 20 MEQ PO TBCR: 40.0000 meq | EXTENDED_RELEASE_TABLET | Freq: Every day | ORAL | Status: DC

## 2015-01-26 MED ORDER — AMOXICILLIN-POT CLAVULANATE 875-125 MG PO TABS: 1.0000 | ORAL_TABLET | Freq: Two times a day (BID) | ORAL | Status: DC

## 2015-01-26 NOTE — Care Management Note (Signed)
Case Management Note  Patient Details  Name: Cody Hale MRN: [id number] Date of Birth: 03-03-1951  Subjective/Objective:                    Action/Plan:   Expected Discharge Date:                  Expected Discharge Plan:  Home w Home Health Services  In-House Referral:  NA  Discharge planning Services  CM Consult  Post Acute Care Choice:  Home Health Choice offered to:  Patient  DME Arranged:    DME Agency:     HH Arranged:  RN (CSW) HH Agency:  [facility]  Status of Service:  Completed, signed off  Medicare Important Message Given:  Yes-second notification given Date Medicare IM Given:    Medicare IM give by:    Date Additional Medicare IM Given:    Additional Medicare Important Message give by:     If discussed at Long Length of Stay Meetings, dates discussed:    Additional Comments: Pt discharged home today with [facility] RN and CSW (per pts choice). Alroy Bailiff of [facility] is aware and will collect the pts information from the chart. Pt HH services to start within 48 hours of discharge. No DME needs noted. Pt and pts nurse aware of discharge arrangements. Arlyss Queen [place], RN 01/26/2015, 11:51 AM

## 2015-01-26 NOTE — Clinical Social Work Note (Signed)
CSW received call from [facility] requesting FL2 for pt. CSW explained that unless pt went to facility today, then he would need to get FL2 from PCP. This was explained yesterday to pt and wife as well. Facility stated that daughter was there now and would speak to her father about possibly going to ALF today. CSW met with pt and discussed as well. He requested information on cost of ALF. This was explained to him for [facility] and private pay. Pt reports either way that he is not sure he would want to do this and will definitely need to speak to his wife about it further before making a decision. Pt has no intention of going to ALF from hospital. CSW will sign off.   Benay Pike, [place]

## 2015-01-26 NOTE — Discharge Summary (Signed)
Physician Discharge Summary  Cody Hale PXT:[id number] DOB: 02-08-1951 DOA: 01/19/2015  PCP: Scarlette Calico, MD  Admit date: 01/19/2015 Discharge date: 01/26/2015  Time spent: 40 minutes  Recommendations for Outpatient Follow-up:  1. Follow up with PCP 1 week for evaluation of cellulitis right leg. Recommend CBC to track platelet level, monitor BP as medications adjusted 2. Home health RN and SW for skilled observation and assessment  Discharge Diagnoses:  Principal Problem:   Cellulitis of right lower extremity Active Problems:   Morbid obesity   OSA (obstructive sleep apnea)   Essential hypertension, malignant   Type II diabetes mellitus with manifestations   COPD (chronic obstructive pulmonary disease) with chronic bronchitis   Chronic systolic heart failure   Chronic atrial fibrillation   S/P mitral valve replacement with bioprosthetic valve   Cellulitis   Discharge Condition: stable  Diet recommendation:   Filed Weights   01/24/15 6270 01/25/15 0551 01/26/15 0532  Weight: 120.8 kg (266 lb 5.1 oz) 117.482 kg (259 lb) 118.661 kg (261 lb 9.6 oz)    History of present illness:  Cody Hale is a 64 y.o. male with a past medical history that includes diabetes, left leg cellulitis, hypertension, obesity, chronic venous insufficiency, chronic systolic heart failure, A. fib with left bundle branch block, mitral valve replacement on Xarelto, anxiety presented to the emergency department on 01/19/15 chief complaint fever. Initial evaluation in the emergency department revealed a rectal temp of 102 leukocytosis of 23, lactic acid of 2.3 and a right lower extremity concerning for cellulitis.  Patient reported waking up about 4 AM that morning with shortness of breath, chills and some nausea. Associated symptoms included one episode left anterior chest pain and 2 episodes of diarrhea. Described the pain as a pressure lasted only less than a minute rated the pain at 3 out of 10. In  addition he developed some swelling and redness of his right lower extremity. He reported "hitting my leg" on the side of the bed several times over the previious couple of weeks. He stated EMS was called and they opined he was having "a panic attack". He went onto his nutritional center and reported that they commented "you look bad". EMS was called  he was transported to the emergency department. He denied abdominal pain dysuria hematuria frequency or urgency. He denied headache visual disturbances syncope or near-syncope.  Workup in the emergency department significant for creatinine of 1.3 chloride 99 serum glucose 194, leukocytosis of 23 relative neutrophils 91 absolute neutrophils 20.9, lactic acid 2.3, EKG with A. fib and a right bundle branch block. In the emergency department he was hemodynamically stable with rectal temp of 102.6 and was not hypoxic. He was provided with IV fluids Tylenol and vancomycin in the emergency department.  Hospital Course:  Sepsis with leukocytosis, elevated lactic acid, fever - Likely related to right lower extremity cellulitis. MRI did not show any abscess or drainable collections. Blood cultures no growth to date. Provided  vancomycin and zosyn and quickly improved.  Acute renal failure  - Likely related to decreased oral intake related to above.  - Resolved at discharge  Cellulitis  - History of same but on the left leg. provided IV antibiotics and slowly improved. MRI did not show abscess or drainable collection. US venous was done which was without evidence of DVT. Much improved at discharge.   Essential hypertension, malignant:  - Fair control. Home meds included lasix, coreg, lisinopril, cozaar. Lisinopril and cozaar held due to #2. BP  controlled during hospitalization. Close OP follow up for monitoring of BP recommended  Chronic systolic heart failure  - remained compensated, Volume -8.3L  - Chart review indicates he saw his cardiologist  01/11/15. This note indicates his EF in 2014 was 35% and now it's 45-50%. Note indicates he's not completely compliant with diet. His Coreg was increased to 6.25 twice a day at that time.   Chronic atrial fibrillation:  - Rate controlled. - Coreg as above.  - On Xarelto.  S/P mitral valve replacement with bioprosthetic valve:  - Stable. On Xarelto  Type II diabetes mellitus with manifestations: CBG range 142-156.A1c in May 7.3. Resume home regimen  COPD (chronic obstructive pulmonary disease) - with chronic bronchitis: Remained stable at baseline.   Morbid obesity:  - BMI is 37.5 nutritional consult  OSA (obstructive sleep apnea): CPAP   Thrombocytopenia - in the setting of sepsis, also he has a component of chronic thrombocytopenia - Continues to trend upward. OP follow up    Procedures:  none  Consultations:  none  Discharge Exam: Filed Vitals:   01/26/15 0532  BP: 127/54  Pulse: 75  Temp: 98.3 F (36.8 C)  Resp: 16    General: appears comfortable Cardiovascular: irregularly irregular no MGR No LE edema Respiratory: normal effort BS clear bilaterally no wheeze Skin: right leg with retracting mild erythema from below knee to above ankle. Some swelling and tenderness no heat  Discharge Instructions   Discharge Instructions    Diet - low sodium heart healthy    Complete by:  As directed      Discharge instructions    Complete by:  As directed   Take medication as directed Follow up with PCP 1 week for evaluation of cellulitis Keep legs elevated as much as possible     Increase activity slowly    Complete by:  As directed           Current Discharge Medication List    START taking these medications   Details  amoxicillin-clavulanate (AUGMENTIN) 875-125 MG per tablet Take 1 tablet by mouth 2 (two) times daily. Qty: 8 tablet, Refills: 0    doxycycline (VIBRAMYCIN) 100 MG capsule Take 1 capsule (100 mg total) by mouth 2 (two) times  daily. Qty: 8 capsule, Refills: 0    potassium chloride SA (K-DUR,KLOR-CON) 20 MEQ tablet Take 2 tablets (40 mEq total) by mouth daily. Qty: 60 tablet, Refills: 1      CONTINUE these medications which have NOT CHANGED   Details  Blood Glucose Monitoring Suppl (ONETOUCH VERIO IQ SYSTEM) W/DEVICE KIT 1 Act by Does not apply route 3 (three) times daily. Qty: 2 kit, Refills: 0   Associated Diagnoses: Type II diabetes mellitus with manifestations    canagliflozin (INVOKANA) 300 MG TABS tablet Take 300 mg by mouth daily before breakfast. Qty: 90 tablet, Refills: 3   Associated Diagnoses: Type II diabetes mellitus with manifestations    carvedilol (COREG) 6.25 MG tablet Take 1 tablet (6.25 mg total) by mouth 2 (two) times daily. Qty: 180 tablet, Refills: 3    furosemide (LASIX) 40 MG tablet TAKE ONE TABLET BY MOUTH TWICE DAILY Qty: 60 tablet, Refills: 5    glucose blood test strip Use TID Qty: 100 each, Refills: 12   Associated Diagnoses: Type II diabetes mellitus with manifestations    HYDROcodone-acetaminophen (NORCO) 10-325 MG per tablet Take 1 tablet by mouth every 8 (eight) hours as needed. Qty: 75 tablet, Refills: 0   Associated Diagnoses: Primary osteoarthritis  of both knees    Insulin Glargine (TOUJEO SOLOSTAR) 300 UNIT/ML SOPN Inject 30 Units into the skin daily. Qty: 1.5 mL, Refills: 11   Associated Diagnoses: Type II diabetes mellitus with manifestations    Lancets (ONETOUCH ULTRASOFT) lancets Use to help check blood sugars twice a day E11.8 Qty: 100 each, Refills: 3    Multiple Vitamin (MULTIVITAMIN WITH MINERALS) TABS Take 1 tablet by mouth daily.    nitroGLYCERIN (NITROSTAT) 0.4 MG SL tablet Place 1 tablet (0.4 mg total) under the tongue every 5 (five) minutes x 3 doses as needed for chest pain. Qty: 25 tablet, Refills: 3    rivaroxaban (XARELTO) 20 MG TABS tablet Take 1 tablet (20 mg total) by mouth daily with supper. Qty: 20 tablet, Refills: 0    simvastatin  (ZOCOR) 20 MG tablet Take 1 tablet (20 mg total) by mouth at bedtime. Qty: 90 tablet, Refills: 3   Associated Diagnoses: Type II diabetes mellitus with manifestations; Hyperlipidemia with target LDL less than 70    sitaGLIPtin-metformin (JANUMET) 50-1000 MG per tablet Take 1 tablet by mouth 2 (two) times daily with a meal. Qty: 180 tablet, Refills: 1   Associated Diagnoses: Type II diabetes mellitus with manifestations      STOP taking these medications     LISINOPRIL PO      losartan (COZAAR) 100 MG tablet        Allergies  Allergen Reactions  . Daptomycin Rash  . Tape Rash and Other (See Comments)    Adhesive Tape-Burn skin.   Follow-up Information    Follow up with Scarlette Calico, MD On 02/02/2015.   Specialty:  Internal Medicine   Why:  evaluate cellulits in Rt. leg at 10:00 ma   Contact information:   [address] [address] [phone]        The results of significant diagnostics from this hospitalization (including imaging, microbiology, ancillary and laboratory) are listed below for reference.    Significant Diagnostic Studies: Dg Chest 2 View  01/19/2015   CLINICAL DATA:  Fever and weakness this morning.  EXAM: CHEST  2 VIEW  COMPARISON:  05/31/2015  FINDINGS: The heart is enlarged but stable. 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 The mediastinal and hilar contours are within normal limits and unchanged. The lungs are clear of acute process. No pleural effusion or pneumothorax.  Numerous remote healed right-sided rib fractures are noted. No acute bony findings.  IMPRESSION: Stable Cardiac enlargement but no acute pulmonary findings.   Electronically Signed   By: Marijo Sanes M.D.   On: 01/19/2015 13:50   Mr Tibia Fibula Right W Wo Contrast  01/21/2015   CLINICAL DATA:  Right leg cellulitis. History of left leg cellulitis.  EXAM: MRI OF LOWER RIGHT EXTREMITY WITHOUT AND WITH CONTRAST  TECHNIQUE: Multiplanar, multisequence MR imaging  of the left leg was performed both before and after administration of intravenous contrast.  CONTRAST:  83m MULTIHANCE GADOBENATE DIMEGLUMINE 529 MG/ML IV SOLN  COMPARISON:  None.  FINDINGS: There is no marrow signal abnormality. There is no acute fracture or dislocation. There is no periosteal reaction. There is no focal muscle signal abnormality. There is no abnormal muscle enhancement. There is soft tissue edema involving the right lower leg circumferentially with mild enhancement. There is no drainable fluid collection.  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  IMPRESSION: 1. Cellulitis of the right lower leg without a drainable abscess. 2. No osseous abnormality.  Electronically Signed   By: Kathreen Devoid   On: 01/21/2015 12:29   US Venous Img Lower Unilateral Right  01/24/2015   CLINICAL DATA:  Swelling.  EXAM: RIGHT LOWER EXTREMITY VENOUS DOPPLER ULTRASOUND  TECHNIQUE: Gray-scale sonography with graded compression, as well as color Doppler and duplex ultrasound were performed to evaluate the lower extremity deep venous systems from the level of the common femoral vein and including the common femoral, femoral, profunda femoral, popliteal and calf veins including the posterior tibial, peroneal and gastrocnemius veins when visible. The superficial great saphenous vein was also interrogated. Spectral Doppler was utilized to evaluate flow at rest and with distal augmentation maneuvers in the common femoral, femoral and popliteal veins.  COMPARISON:  MRI 01/21/2015.  FINDINGS: Contralateral Common Femoral Vein: Respiratory phasicity is normal and symmetric with the symptomatic side. No evidence of thrombus. Normal compressibility.  Common Femoral Vein: No evidence of thrombus. Normal compressibility, respiratory phasicity and response to augmentation.  Saphenofemoral Junction: No evidence of thrombus. Normal compressibility and flow on color  Doppler imaging.  Profunda Femoral Vein: No evidence of thrombus. Normal compressibility and flow on color Doppler imaging.  Femoral Vein: No evidence of thrombus. Normal compressibility, respiratory phasicity and response to augmentation.  Popliteal Vein: No evidence of thrombus. Normal compressibility, respiratory phasicity and response to augmentation.  Calf Veins: No evidence of thrombus. Normal compressibility and flow on color Doppler imaging.  Superficial Great Saphenous Vein: No evidence of thrombus. Normal compressibility and flow on color Doppler imaging.  Venous Reflux:  None.  Other Findings:  Soft tissue edema noted distally.  IMPRESSION: No evidence of deep venous thrombosis.   Electronically Signed   By: Marcello Moores  Register   On: 01/24/2015 16:04    Microbiology: Recent Results (from the past 240 hour(s))  Blood culture (routine x 2)     Status: None   Collection Time: 01/19/15 11:20 AM  Result Value Ref Range Status   Specimen Description BLOOD LEFT ANTECUBITAL DRAWN BY RN  Final   Special Requests   Final    BOTTLES DRAWN AEROBIC AND ANAEROBIC AEB=6CC ANA=4CC   Culture NO GROWTH 5 DAYS  Final   Report Status 01/24/2015 FINAL  Final  Blood culture (routine x 2)     Status: None   Collection Time: 01/19/15 11:25 AM  Result Value Ref Range Status   Specimen Description BLOOD RIGHT ANTECUBITAL  Final   Special Requests BOTTLES DRAWN AEROBIC ONLY 12CC  Final   Culture NO GROWTH 5 DAYS  Final   Report Status 01/24/2015 FINAL  Final  Clostridium Difficile by PCR (not at [facility])     Status: None   Collection Time: 01/20/15  7:52 PM  Result Value Ref Range Status   C difficile by pcr NEGATIVE NEGATIVE Final     Labs: Basic Metabolic Panel:  Recent Labs Lab 01/21/15 0617 01/23/15 0710 01/24/15 0818 01/25/15 0631 01/25/15 0633 01/26/15 0641  NA 137 139 139  --  140 139  K 3.9 3.5 3.6  --  3.3* 4.1  CL 104 102 100*  --  100* 101  CO2 27 28 30   --  31 30  GLUCOSE 125* 142*  165*  --  152* 152*  BUN 16 11 10   --  11 14  CREATININE 0.96 0.72 0.78  --  0.80 0.78  CALCIUM 8.1* 8.7* 8.7*  --  8.8* 8.8*  MG  --   --   --  2.0  --   --  Liver Function Tests: No results for input(s): AST, ALT, ALKPHOS, BILITOT, PROT, ALBUMIN in the last 168 hours. No results for input(s): LIPASE, AMYLASE in the last 168 hours. No results for input(s): AMMONIA in the last 168 hours. CBC:  Recent Labs Lab 01/20/15 0622 01/21/15 0617 01/23/15 0710 01/24/15 0818 01/25/15 0633  WBC 12.0* 6.5 5.5 5.3 5.1  HGB 14.0 14.1 13.9 14.8 14.4  HCT 43.8 43.9 42.2 44.6 44.1  MCV 95.4 96.1 92.1 92.3 92.8  PLT 94* 86* 120* 138* 141*   Cardiac Enzymes: No results for input(s): CKTOTAL, CKMB, CKMBINDEX, TROPONINI in the last 168 hours. BNP: BNP (last 3 results) No results for input(s): BNP in the last 8760 hours.  ProBNP (last 3 results) No results for input(s): PROBNP in the last 8760 hours.  CBG:  Recent Labs Lab 01/25/15 1117 01/25/15 1632 01/25/15 2102 01/26/15 0716 01/26/15 1124  GLUCAP 164* 128* 202* 142* 156*       Signed:  Jamesina Gaugh M  [facility] 01/26/2015, 11:41 AM

## 2015-01-26 NOTE — Care Management Important Message (Signed)
Important Message  Patient Details  Name: BRANDDON HALLIWELL MRN: [id number] Date of Birth: 1951/02/24   Medicare Important Message Given:  Yes-third notification given    Cheryl Flash, RN 01/26/2015, 11:55 AM

## 2015-01-26 NOTE — Progress Notes (Signed)
Discharge instructions and prescriptions given, verbalized understanding, out in stable condition via w/c with staff. 

## 2015-02-01 IMAGING — CR DG CHEST 2V
2 series · 2 of 2 positions shown · non-contrast
Comparison: Chest x-rays dated 06/03/2013 and 12/24/2012

CLINICAL DATA: Productive cough.

EXAM:
CHEST  2 VIEW

[view not recorded (1 of 2)]
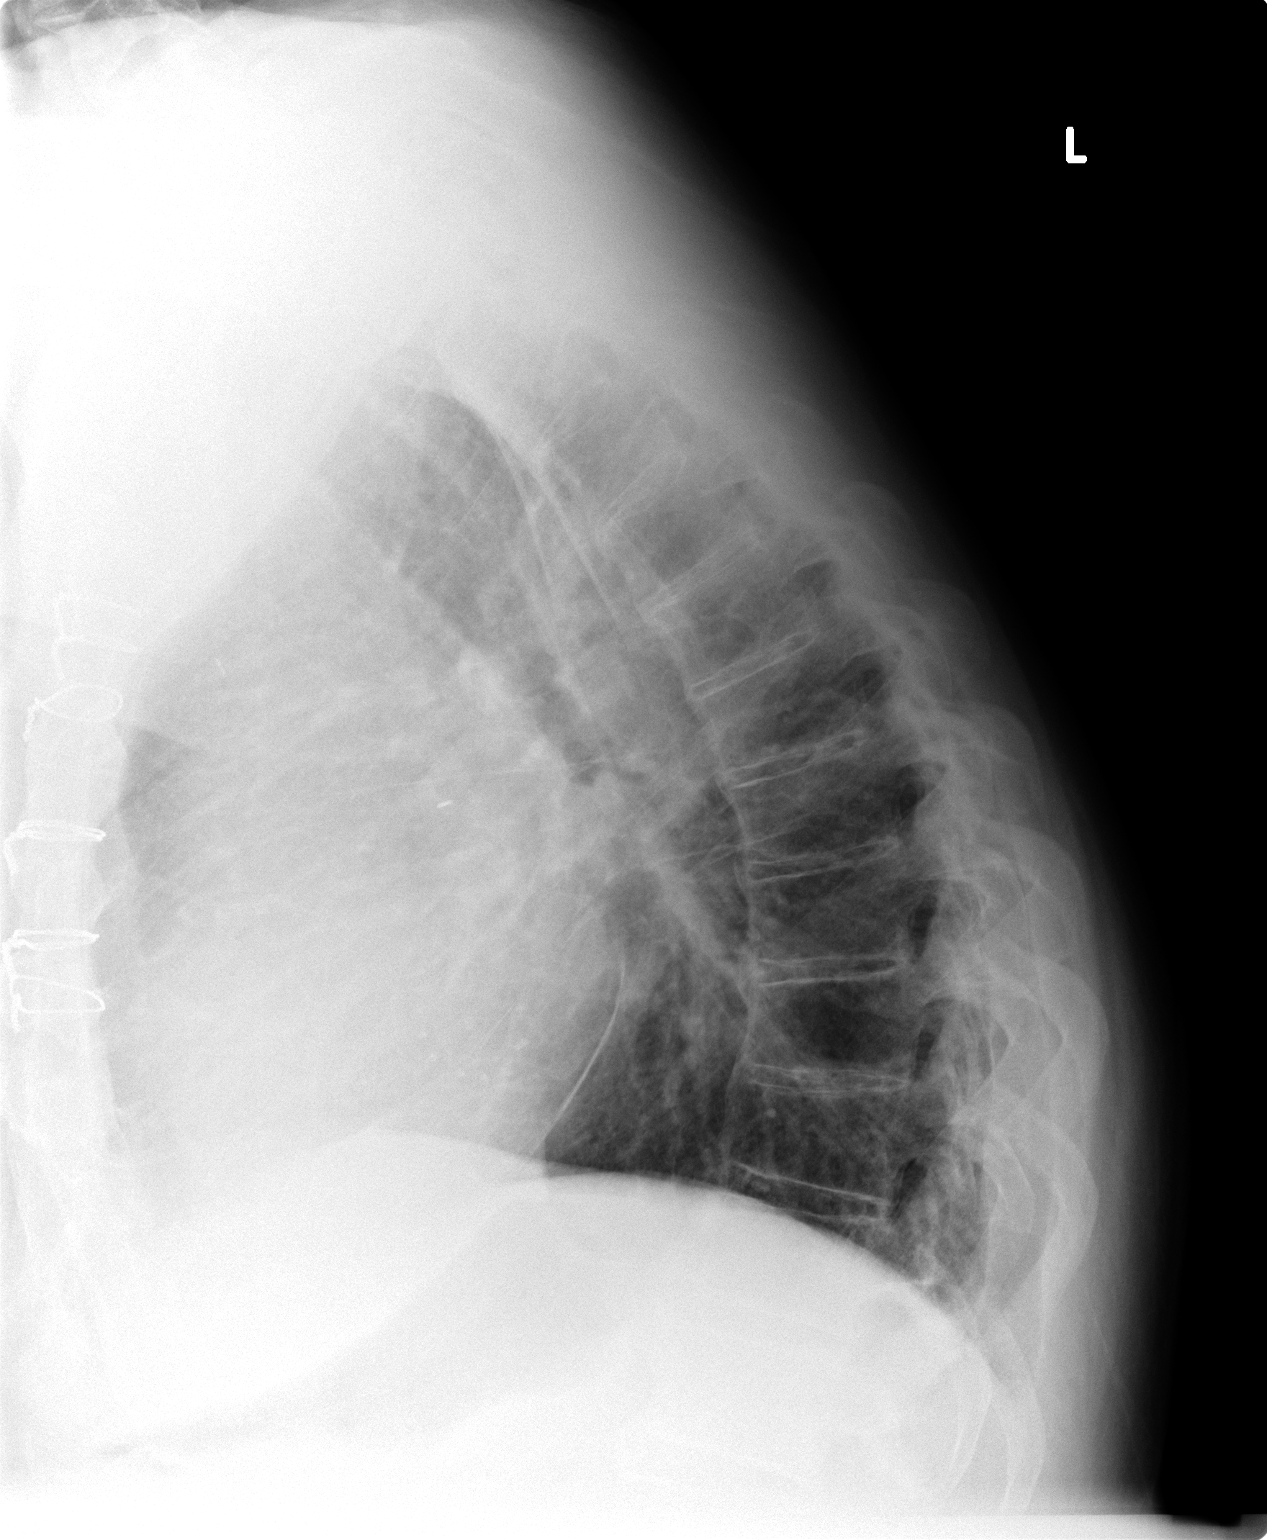

[view not recorded (2 of 2)]
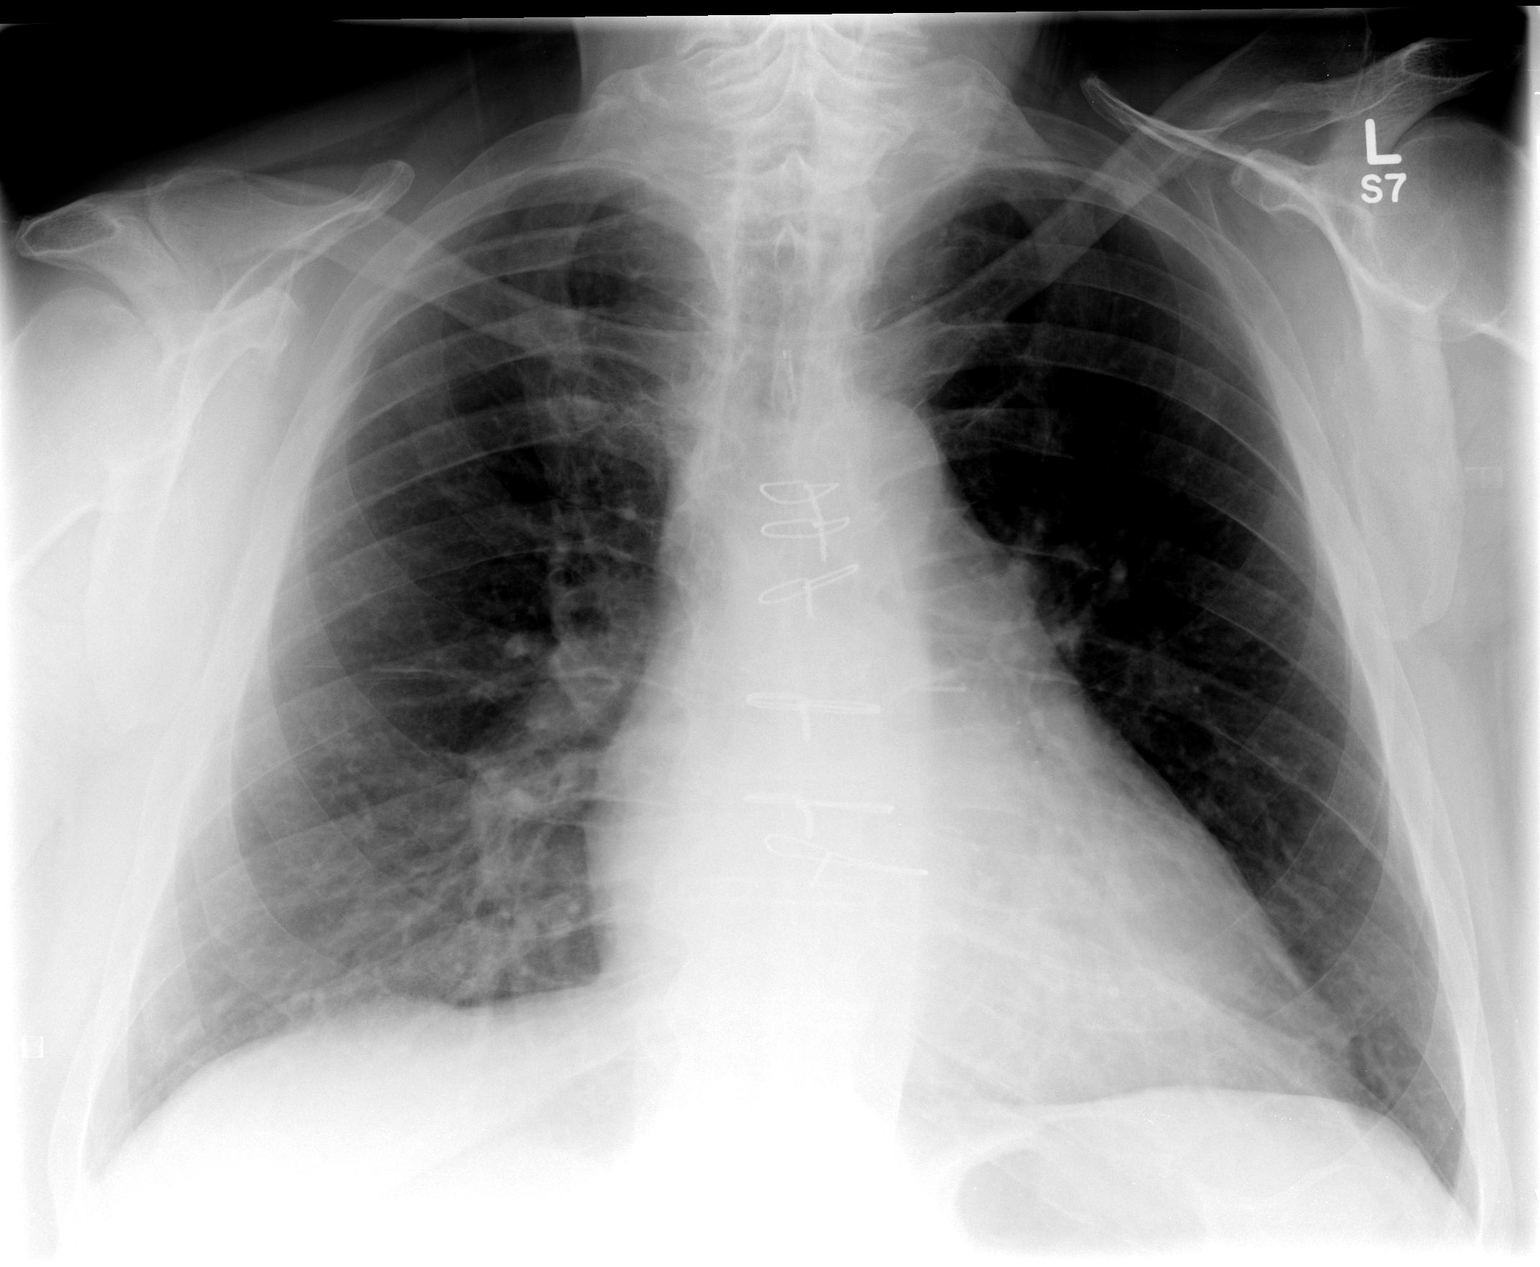

[2 of 2 positions shown; findings below may reference images not displayed]

FINDINGS: Heart size and pulmonary vascularity are within normal limits. No
infiltrates or effusions. No acute osseous abnormality.
IMPRESSION: No acute disease.

## 2015-02-02 ENCOUNTER — Encounter: Payer: Self-pay | Admitting: Internal Medicine

## 2015-02-02 ENCOUNTER — Ambulatory Visit (INDEPENDENT_AMBULATORY_CARE_PROVIDER_SITE_OTHER): Payer: Commercial Managed Care - HMO | Admitting: Internal Medicine

## 2015-02-02 ENCOUNTER — Other Ambulatory Visit (INDEPENDENT_AMBULATORY_CARE_PROVIDER_SITE_OTHER): Payer: Commercial Managed Care - HMO

## 2015-02-02 DIAGNOSIS — M170 Bilateral primary osteoarthritis of knee: Secondary | ICD-10-CM

## 2015-02-02 DIAGNOSIS — M160 Bilateral primary osteoarthritis of hip: Secondary | ICD-10-CM

## 2015-02-02 DIAGNOSIS — L03115 Cellulitis of right lower limb: Secondary | ICD-10-CM | POA: Diagnosis not present

## 2015-02-02 DIAGNOSIS — Z111 Encounter for screening for respiratory tuberculosis: Secondary | ICD-10-CM

## 2015-02-02 LAB — COMPREHENSIVE METABOLIC PANEL
ALT: 18 U/L (ref 0–53)
AST: 16 U/L (ref 0–37)
Albumin: 4.2 g/dL (ref 3.5–5.2)
Alkaline Phosphatase: 71 U/L (ref 39–117)
BUN: 17 mg/dL (ref 6–23)
CO2: 32 mEq/L (ref 19–32)
Calcium: 9.9 mg/dL (ref 8.4–10.5)
Chloride: 105 mEq/L (ref 96–112)
Creatinine, Ser: 1.09 mg/dL (ref 0.40–1.50)
GFR: 72.33 mL/min (ref >60.00)
Glucose, Bld: 103 mg/dL — ABNORMAL HIGH (ref 70–99)
Potassium: 4.4 mEq/L (ref 3.5–5.1)
Sodium: 143 mEq/L (ref 135–145)
Total Bilirubin: 0.8 mg/dL (ref 0.2–1.2)
Total Protein: 7.3 g/dL (ref 6.0–8.3)

## 2015-02-02 LAB — PHOSPHORUS: Phosphorus: 4 mg/dL (ref 2.3–4.6)

## 2015-02-02 MED ORDER — HYDROCODONE-ACETAMINOPHEN 10-325 MG PO TABS: 1.0000 | ORAL_TABLET | Freq: Three times a day (TID) | ORAL | Status: DC | PRN

## 2015-02-02 NOTE — Patient Instructions (Signed)

## 2015-02-02 NOTE — Assessment & Plan Note (Signed)
He has completed his antibiotics and this appears to have resolved. He will monitor the area for any recurrent signs of infection.

## 2015-02-02 NOTE — Progress Notes (Signed)
Pre visit review using our clinic review tool, if applicable. No additional management support is needed unless otherwise documented below in the visit note. 

## 2015-02-02 NOTE — Progress Notes (Signed)
Subjective:  Patient ID: Cody Hale, male    DOB: [birth year]  Age: 64 y.o. MRN: [id number]  CC: Follow-up   HPI Cody Hale presents for follow-up after recent admission for cellulitis on his right lower extremity with SIRS. He has completed a course of Augmentin and doxycycline. He is pleased to report that all the symptoms in the right lower extremity have resolved. There is a small area of redness just above his right ankle but it does not bother him. He requests a refill of his pain medications today for chronic knee and back pain..  Outpatient Prescriptions Prior to Visit  Medication Sig Dispense Refill  . Blood Glucose Monitoring Suppl (ONETOUCH VERIO IQ SYSTEM) W/DEVICE KIT 1 Act by Does not apply route 3 (three) times daily. 2 kit 0  . canagliflozin (INVOKANA) 300 MG TABS tablet Take 300 mg by mouth daily before breakfast. 90 tablet 3  . carvedilol (COREG) 6.25 MG tablet Take 1 tablet (6.25 mg total) by mouth 2 (two) times daily. 180 tablet 3  . furosemide (LASIX) 40 MG tablet TAKE ONE TABLET BY MOUTH TWICE DAILY 60 tablet 5  . glucose blood test strip Use TID 100 each 12  . Lancets (ONETOUCH ULTRASOFT) lancets Use to help check blood sugars twice a day E11.8 100 each 3  . Multiple Vitamin (MULTIVITAMIN WITH MINERALS) TABS Take 1 tablet by mouth daily.    . nitroGLYCERIN (NITROSTAT) 0.4 MG SL tablet Place 1 tablet (0.4 mg total) under the tongue every 5 (five) minutes x 3 doses as needed for chest pain. 25 tablet 3  . potassium chloride SA (K-DUR,KLOR-CON) 20 MEQ tablet Take 2 tablets (40 mEq total) by mouth daily. 60 tablet 1  . rivaroxaban (XARELTO) 20 MG TABS tablet Take 1 tablet (20 mg total) by mouth daily with supper. 20 tablet 0  . simvastatin (ZOCOR) 20 MG tablet Take 1 tablet (20 mg total) by mouth at bedtime. 90 tablet 3  . HYDROcodone-acetaminophen (NORCO) 10-325 MG per tablet Take 1 tablet by mouth every 8 (eight) hours as needed. 75 tablet 0  . Insulin Glargine  (TOUJEO SOLOSTAR) 300 UNIT/ML SOPN Inject 30 Units into the skin daily. (Patient not taking: Reported on 02/02/2015) 1.5 mL 11  . sitaGLIPtin-metformin (JANUMET) 50-1000 MG per tablet Take 1 tablet by mouth 2 (two) times daily with a meal. (Patient not taking: Reported on 02/02/2015) 180 tablet 1  . amoxicillin-clavulanate (AUGMENTIN) 875-125 MG per tablet Take 1 tablet by mouth 2 (two) times daily. (Patient not taking: Reported on 02/02/2015) 8 tablet 0  . doxycycline (VIBRAMYCIN) 100 MG capsule Take 1 capsule (100 mg total) by mouth 2 (two) times daily. (Patient not taking: Reported on 02/02/2015) 8 capsule 0   No facility-administered medications prior to visit.    ROS Review of Systems  Constitutional: Negative.  Negative for fever, chills, diaphoresis, appetite change and fatigue.  HENT: Negative.   Eyes: Negative.   Respiratory: Negative.  Negative for cough, choking, chest tightness and stridor.   Cardiovascular: Negative.  Negative for chest pain, palpitations and leg swelling.  Gastrointestinal: Negative.  Negative for vomiting, abdominal pain, diarrhea and constipation.  Endocrine: Negative.   Genitourinary: Negative.   Musculoskeletal: Negative.   Skin: Positive for color change and rash. Negative for pallor and wound.  Allergic/Immunologic: Negative.   Neurological: Negative.  Negative for dizziness, tremors, light-headedness, numbness and headaches.  Hematological: Negative.  Negative for adenopathy. Does not bruise/bleed easily.  Psychiatric/Behavioral: Negative.  Objective:  BP 140/70 mmHg  Pulse 68  Temp(Src) 97.4 F (36.3 C) (Oral)  Ht $R'5\' 10"'XK$  (1.778 m)  Wt 257 lb 4 oz (116.688 kg)  BMI 36.91 kg/m2  SpO2 95%  BP Readings from Last 3 Encounters:  02/02/15 140/70  01/26/15 127/54  01/11/15 120/64    Wt Readings from Last 3 Encounters:  02/02/15 257 lb 4 oz (116.688 kg)  01/26/15 261 lb 9.6 oz (118.661 kg)  01/21/15 261 lb (118.389 kg)    Physical Exam    Constitutional: He is oriented to person, place, and time. No distress.  HENT:  Mouth/Throat: Oropharynx is clear and moist. No oropharyngeal exudate.  Eyes: Conjunctivae are normal. Right eye exhibits no discharge. Left eye exhibits no discharge. No scleral icterus.  Neck: Normal range of motion. Neck supple. No JVD present. No tracheal deviation present. No thyromegaly present.  Cardiovascular: Normal rate, regular rhythm, normal heart sounds and intact distal pulses.  Exam reveals no gallop and no friction rub.   No murmur heard. Pulmonary/Chest: Effort normal and breath sounds normal. No stridor. No respiratory distress. He has no wheezes. He has no rales. He exhibits no tenderness.  Abdominal: Soft. Bowel sounds are normal. He exhibits no distension and no mass. There is no tenderness. There is no rebound and no guarding.  Musculoskeletal: Normal range of motion. He exhibits no edema or tenderness.       Right lower leg: He exhibits deformity. He exhibits no tenderness, no bony tenderness, no swelling, no edema and no laceration.       Legs: Lymphadenopathy:    He has no cervical adenopathy.  Neurological: He is oriented to person, place, and time.  Skin: Skin is warm and dry. No rash noted. He is not diaphoretic. There is erythema. No pallor.  Psychiatric: He has a normal mood and affect. His behavior is normal. Judgment and thought content normal.    Lab Results  Component Value Date   WBC 5.1 01/25/2015   HGB 14.4 01/25/2015   HCT 44.1 01/25/2015   PLT 141* 01/25/2015   GLUCOSE 152* 01/26/2015   CHOL 102 05/30/2014   TRIG 163.0* 05/30/2014   HDL 27.90* 05/30/2014   LDLDIRECT 84.2 01/31/2014   LDLCALC 42 05/30/2014   ALT 24 01/19/2015   AST 25 01/19/2015   NA 139 01/26/2015   K 4.1 01/26/2015   CL 101 01/26/2015   CREATININE 0.78 01/26/2015   BUN 14 01/26/2015   CO2 30 01/26/2015   TSH 0.311* 01/19/2015   PSA 0.36 05/30/2014   INR 1.13 11/30/2012   HGBA1C 7.3*  11/22/2014   MICROALBUR 0.7 11/22/2014    Dg Chest 2 View  01/19/2015   CLINICAL DATA:  Fever and weakness this morning.  EXAM: CHEST  2 VIEW  COMPARISON:  05/31/2015  FINDINGS: The heart is enlarged but stable. 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 The mediastinal and hilar contours are within normal limits and unchanged. The lungs are clear of acute process. No pleural effusion or pneumothorax.  Numerous remote healed right-sided rib fractures are noted. No acute bony findings.  IMPRESSION: Stable Cardiac enlargement but no acute pulmonary findings.   Electronically Signed   By: Marijo Sanes M.D.   On: 01/19/2015 13:50    Assessment & Plan:   Gagandeep was seen today for follow-up.  Diagnoses and all orders for this visit:  Hypocalcemia- this has resolved, presumed related to the SIRS Orders: -     Comprehensive metabolic panel; Future -  PTH, intact and calcium; Future -     Phosphorus; Future  Screening for tuberculosis- he has decided to enter a nursing home and needs tuberculosis screening. Orders: -     Quantiferon tb gold assay (blood); Future  Primary osteoarthritis of both knees Orders: -     HYDROcodone-acetaminophen (NORCO) 10-325 MG per tablet; Take 1 tablet by mouth every 8 (eight) hours as needed.  Primary osteoarthritis of both hips Orders: -     HYDROcodone-acetaminophen (NORCO) 10-325 MG per tablet; Take 1 tablet by mouth every 8 (eight) hours as needed.   I have discontinued Mr. Montilla amoxicillin-clavulanate and doxycycline. I am also having him maintain his multivitamin with minerals, nitroGLYCERIN, sitaGLIPtin-metformin, Insulin Glargine, canagliflozin, glucose blood, ONETOUCH VERIO IQ SYSTEM, onetouch ultrasoft, furosemide, simvastatin, carvedilol, rivaroxaban, potassium chloride SA, and HYDROcodone-acetaminophen.  Meds ordered this encounter  Medications  . HYDROcodone-acetaminophen (NORCO) 10-325 MG per tablet    Sig: Take 1 tablet by mouth  every 8 (eight) hours as needed.    Dispense:  75 tablet    Refill:  0     Follow-up: No Follow-up on file.  Scarlette Calico, MD

## 2015-02-03 LAB — PTH, INTACT AND CALCIUM
Calcium: 10 mg/dL (ref 8.4–10.5)
PTH: 17 pg/mL (ref 14–64)

## 2015-02-05 LAB — QUANTIFERON TB GOLD ASSAY (BLOOD)
Interferon Gamma Release Assay: NEGATIVE
Mitogen value: >10 IU/mL
Quantiferon Nil Value: 0.1 IU/mL
Quantiferon Tb Ag Minus Nil Value: 0 IU/mL
TB Ag value: 0.1 IU/mL

## 2015-02-09 DIAGNOSIS — L97919 Non-pressure chronic ulcer of unspecified part of right lower leg with unspecified severity: Secondary | ICD-10-CM | POA: Diagnosis not present

## 2015-02-20 ENCOUNTER — Telehealth: Payer: Self-pay | Admitting: Internal Medicine

## 2015-02-20 NOTE — Telephone Encounter (Signed)
Now is requesting med list to be signed, dated and faxed to them so they can forwards to pharmacy.

## 2015-02-20 NOTE — Telephone Encounter (Signed)
Is requesting current med list for patient to be faxed to [phone].  States needs in next 15- 20 minutes.  Also states their facility does not take samples.

## 2015-02-20 NOTE — Telephone Encounter (Signed)
Fax sent.

## 2015-02-21 NOTE — Telephone Encounter (Signed)
Steffanie informed associate we needed a copy of pt med list and would correlate the appropriate med list. Associate declined. Last AVS will be sent to show what meds were continued/discontinued.  

## 2015-02-21 NOTE — Telephone Encounter (Signed)
Cody Hale from [facility] received pts med list but there is conflicting meds between pt and list °Please call her this morning. She only has enough meds for this morning °She can be reached at [phone] °

## 2015-02-21 NOTE — Telephone Encounter (Signed)
faxed

## 2015-02-22 ENCOUNTER — Other Ambulatory Visit: Payer: Self-pay | Admitting: Internal Medicine

## 2015-02-22 DIAGNOSIS — Z111 Encounter for screening for respiratory tuberculosis: Secondary | ICD-10-CM

## 2015-02-22 MED ORDER — POTASSIUM CHLORIDE CRYS ER 20 MEQ PO TBCR: 40.0000 meq | EXTENDED_RELEASE_TABLET | Freq: Every day | ORAL | Status: DC

## 2015-02-24 ENCOUNTER — Encounter: Payer: Self-pay | Admitting: Cardiovascular Disease

## 2015-02-24 ENCOUNTER — Ambulatory Visit (INDEPENDENT_AMBULATORY_CARE_PROVIDER_SITE_OTHER): Payer: Commercial Managed Care - HMO | Admitting: Cardiovascular Disease

## 2015-02-24 ENCOUNTER — Telehealth: Payer: Self-pay | Admitting: Internal Medicine

## 2015-02-24 VITALS — BP 130/70 | HR 80 | Ht 70.0 in | Wt 257.0 lb

## 2015-02-24 DIAGNOSIS — I482 Chronic atrial fibrillation, unspecified: Secondary | ICD-10-CM

## 2015-02-24 DIAGNOSIS — Z87898 Personal history of other specified conditions: Secondary | ICD-10-CM

## 2015-02-24 DIAGNOSIS — I447 Left bundle-branch block, unspecified: Secondary | ICD-10-CM

## 2015-02-24 DIAGNOSIS — Z7189 Other specified counseling: Secondary | ICD-10-CM

## 2015-02-24 DIAGNOSIS — I5022 Chronic systolic (congestive) heart failure: Secondary | ICD-10-CM

## 2015-02-24 DIAGNOSIS — I10 Essential (primary) hypertension: Secondary | ICD-10-CM | POA: Diagnosis not present

## 2015-02-24 DIAGNOSIS — I429 Cardiomyopathy, unspecified: Secondary | ICD-10-CM

## 2015-02-24 DIAGNOSIS — Z954 Presence of other heart-valve replacement: Secondary | ICD-10-CM

## 2015-02-24 DIAGNOSIS — Z953 Presence of xenogenic heart valve: Secondary | ICD-10-CM

## 2015-02-24 DIAGNOSIS — Z952 Presence of prosthetic heart valve: Secondary | ICD-10-CM

## 2015-02-24 DIAGNOSIS — Z9289 Personal history of other medical treatment: Secondary | ICD-10-CM

## 2015-02-24 LAB — TB SKIN TEST
Induration: 0 mm
TB Skin Test: NEGATIVE

## 2015-02-24 MED ORDER — POTASSIUM CHLORIDE CRYS ER 10 MEQ PO TBCR: 10.0000 meq | EXTENDED_RELEASE_TABLET | Freq: Every day | ORAL | Status: DC

## 2015-02-24 MED ORDER — FUROSEMIDE 20 MG PO TABS: 20.0000 mg | ORAL_TABLET | Freq: Every day | ORAL | Status: DC

## 2015-02-24 NOTE — Telephone Encounter (Signed)
There is no insulin on his list.  Reading the last time he was here he was on Tujeo which is a long acting insulin to be taken once daily which should be given no matter what blood sugar as there is no peak to the medication. Otherwise I do not see anything else to adjust.

## 2015-02-24 NOTE — Telephone Encounter (Signed)
Spoke with Dondra Spry at [facility] pt is not wanting to take insulin at night in fear of bottoming out during the night. Dondra Spry stated it is more of his wife handling. At night when they check his sugar is has been around 140-150. PT wife states he doesn't need insulin when his numbers are around 120 because it could get to low. Assisted living policy when a pt refuses medication three times PCP has to be notified. They would like advisement on how his insulin is supposed to be administered. PT was in here yesterday with wife during OV to discuss all this but we are still having problems
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
## 2015-02-24 NOTE — Patient Instructions (Signed)
   Decrease Lasix to 20mg  daily   Decrease Potassium to daily  New 90 day printed scripts given on above today. Continue all other medications.   Your physician wants you to follow up in:  1 year.  You will receive a reminder letter in the mail one-two months in advance.  If you don't receive a letter, please call our office to schedule the follow up appointment

## 2015-02-24 NOTE — Telephone Encounter (Signed)
Dondra Spry from [facility] called and patient was admitted on Monday and he refuses to take his insulin at bedtime Please call her at [phone]

## 2015-02-28 ENCOUNTER — Other Ambulatory Visit: Payer: Self-pay | Admitting: Internal Medicine

## 2015-02-28 DIAGNOSIS — F450 Somatization disorder: Principal | ICD-10-CM

## 2015-02-28 DIAGNOSIS — F32A Depression, unspecified: Secondary | ICD-10-CM

## 2015-02-28 DIAGNOSIS — F329 Major depressive disorder, single episode, unspecified: Secondary | ICD-10-CM

## 2015-02-28 MED ORDER — VILAZODONE HCL 40 MG PO TABS: 40.0000 mg | ORAL_TABLET | Freq: Every day | ORAL | Status: DC

## 2015-02-28 NOTE — Telephone Encounter (Signed)
Ok to rf? Not on current med list pulled from history. Pt states current assisted living does not go on samples requesting script.

## 2015-02-28 NOTE — Telephone Encounter (Signed)
Patient is requesting an RX for Vibriid 40mg  tab. They advise that they were being given samples, but cannot receive them @ the assisted living facility because it is not on the FL2.

## 2015-02-28 NOTE — Telephone Encounter (Signed)
Script sent  

## 2015-03-07 ENCOUNTER — Ambulatory Visit: Payer: Commercial Managed Care - HMO | Admitting: Family Medicine

## 2015-03-12 ENCOUNTER — Emergency Department (HOSPITAL_COMMUNITY)
Admission: EM | Admit: 2015-03-12 | Discharge: 2015-03-13 | Disposition: A | Discharge status: Home / Self Care | Payer: Commercial Managed Care - HMO | Attending: Emergency Medicine | Admitting: Emergency Medicine

## 2015-03-12 ENCOUNTER — Emergency Department (HOSPITAL_COMMUNITY): Payer: Commercial Managed Care - HMO

## 2015-03-12 ENCOUNTER — Encounter (HOSPITAL_COMMUNITY): Payer: Self-pay | Admitting: Emergency Medicine

## 2015-03-12 DIAGNOSIS — S0081XA Abrasion of other part of head, initial encounter: Secondary | ICD-10-CM | POA: Diagnosis not present

## 2015-03-12 DIAGNOSIS — Z872 Personal history of diseases of the skin and subcutaneous tissue: Secondary | ICD-10-CM | POA: Insufficient documentation

## 2015-03-12 DIAGNOSIS — I5022 Chronic systolic (congestive) heart failure: Secondary | ICD-10-CM | POA: Insufficient documentation

## 2015-03-12 DIAGNOSIS — Z79899 Other long term (current) drug therapy: Secondary | ICD-10-CM | POA: Insufficient documentation

## 2015-03-12 DIAGNOSIS — Y9289 Other specified places as the place of occurrence of the external cause: Secondary | ICD-10-CM | POA: Insufficient documentation

## 2015-03-12 DIAGNOSIS — Z87440 Personal history of urinary (tract) infections: Secondary | ICD-10-CM | POA: Insufficient documentation

## 2015-03-12 DIAGNOSIS — I10 Essential (primary) hypertension: Secondary | ICD-10-CM | POA: Insufficient documentation

## 2015-03-12 DIAGNOSIS — I2510 Atherosclerotic heart disease of native coronary artery without angina pectoris: Secondary | ICD-10-CM | POA: Diagnosis not present

## 2015-03-12 DIAGNOSIS — E119 Type 2 diabetes mellitus without complications: Secondary | ICD-10-CM | POA: Insufficient documentation

## 2015-03-12 DIAGNOSIS — Y998 Other external cause status: Secondary | ICD-10-CM | POA: Diagnosis not present

## 2015-03-12 DIAGNOSIS — S8992XA Unspecified injury of left lower leg, initial encounter: Secondary | ICD-10-CM | POA: Diagnosis present

## 2015-03-12 DIAGNOSIS — S0990XA Unspecified injury of head, initial encounter: Secondary | ICD-10-CM | POA: Insufficient documentation

## 2015-03-12 DIAGNOSIS — Z8659 Personal history of other mental and behavioral disorders: Secondary | ICD-10-CM | POA: Diagnosis not present

## 2015-03-12 DIAGNOSIS — W01198A Fall on same level from slipping, tripping and stumbling with subsequent striking against other object, initial encounter: Secondary | ICD-10-CM | POA: Diagnosis not present

## 2015-03-12 DIAGNOSIS — Z87442 Personal history of urinary calculi: Secondary | ICD-10-CM | POA: Diagnosis not present

## 2015-03-12 DIAGNOSIS — S81012A Laceration without foreign body, left knee, initial encounter: Secondary | ICD-10-CM | POA: Diagnosis not present

## 2015-03-12 DIAGNOSIS — Z87891 Personal history of nicotine dependence: Secondary | ICD-10-CM | POA: Diagnosis not present

## 2015-03-12 DIAGNOSIS — Y93H2 Activity, gardening and landscaping: Secondary | ICD-10-CM | POA: Diagnosis not present

## 2015-03-12 DIAGNOSIS — IMO0002 Reserved for concepts with insufficient information to code with codable children: Secondary | ICD-10-CM

## 2015-03-12 DIAGNOSIS — S8001XA Contusion of right knee, initial encounter: Secondary | ICD-10-CM | POA: Diagnosis not present

## 2015-03-12 MED ORDER — LIDOCAINE HCL (PF) 1 % IJ SOLN
2.0000 mL | Freq: Once | INTRAMUSCULAR | Status: AC
  Administered 2015-03-12: 2 mL
  Filled 2015-03-12: qty 5

## 2015-03-12 NOTE — ED Notes (Signed)
Patient tripped on sidewalk and fell tonight. Denies LOC. Complaining of pain to knees bilaterally and pain to forehead. Abrasions to noted to knees bilaterally and superficial abrasions to forehead.
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
## 2015-03-13 DIAGNOSIS — S81012A Laceration without foreign body, left knee, initial encounter: Secondary | ICD-10-CM | POA: Diagnosis not present

## 2015-03-13 MED ORDER — BACITRACIN-NEOMYCIN-POLYMYXIN 400-5-5000 EX OINT
TOPICAL_OINTMENT | Freq: Once | CUTANEOUS | Status: AC
  Administered 2015-03-13: 2 via TOPICAL
  Filled 2015-03-13: qty 2

## 2015-03-13 NOTE — Discharge Instructions (Signed)
Head Injury °You have received a head injury. It does not appear serious at this time. Headaches and vomiting are common following head injury. It should be easy to awaken from sleeping. Sometimes it is necessary for you to stay in the emergency department for a while for observation. Sometimes admission to the hospital may be needed. After injuries such as yours, most problems occur within the first 24 hours, but side effects may occur up to 7-10 days after the injury. It is important for you to carefully monitor your condition and contact your health care provider or seek immediate medical care if there is a change in your condition. °WHAT ARE THE TYPES OF HEAD INJURIES? °Head injuries can be as minor as a bump. Some head injuries can be more severe. More severe head injuries include: °· A jarring injury to the brain (concussion). °· A bruise of the brain (contusion). This mean there is bleeding in the brain that can cause swelling. °· A cracked skull (skull fracture). °· Bleeding in the brain that collects, clots, and forms a bump (hematoma). °WHAT CAUSES A HEAD INJURY? °A serious head injury is most likely to happen to someone who is in a car wreck and is not wearing a seat belt. Other causes of major head injuries include bicycle or motorcycle accidents, sports injuries, and falls. °HOW ARE HEAD INJURIES DIAGNOSED? °A complete history of the event leading to the injury and your current symptoms will be helpful in diagnosing head injuries. Many times, pictures of the brain, such as CT or MRI are needed to see the extent of the injury. Often, an overnight hospital stay is necessary for observation.  °WHEN SHOULD I SEEK IMMEDIATE MEDICAL CARE?  °You should get help right away if: °· You have confusion or drowsiness. °· You feel sick to your stomach (nauseous) or have continued, forceful vomiting. °· You have dizziness or unsteadiness that is getting worse. °· You have severe, continued headaches not relieved by  medicine. Only take over-the-counter or prescription medicines for pain, fever, or discomfort as directed by your health care provider. °· You do not have normal function of the arms or legs or are unable to walk. °· You notice changes in the black spots in the center of the colored part of your eye (pupil). °· You have a clear or bloody fluid coming from your nose or ears. °· You have a loss of vision. °During the next 24 hours after the injury, you must stay with someone who can watch you for the warning signs. This person should contact local emergency services (911 in the [place] if you have seizures, you become unconscious, or you are unable to wake up. °HOW CAN I PREVENT A HEAD INJURY IN THE FUTURE? °The most important factor for preventing major head injuries is avoiding motor vehicle accidents.  To minimize the potential for damage to your head, it is crucial to wear seat belts while riding in motor vehicles. Wearing helmets while bike riding and playing collision sports (like football) is also helpful. Also, avoiding dangerous activities around the house will further help reduce your risk of head injury.  °WHEN CAN I RETURN TO NORMAL ACTIVITIES AND ATHLETICS? °You should be reevaluated by your health care provider before returning to these activities. If you have any of the following symptoms, you should not return to activities or contact sports until 1 week after the symptoms have stopped: °· Persistent headache. °· Dizziness or vertigo. °· Poor attention and concentration. °· Confusion. °·   Memory problems. °· Nausea or vomiting. °· Fatigue or tire easily. °· Irritability. °· Intolerant of bright lights or loud noises. °· Anxiety or depression. °· Disturbed sleep. °MAKE SURE YOU:  °· Understand these instructions. °· Will watch your condition. °· Will get help right away if you are not doing well or get worse. °Document Released: 06/24/2005 Document Revised: 06/29/2013 Document Reviewed:  03/01/2013 °ExitCare® Patient Information ©2015 [employer]. This information is not intended to replace advice given to you by your health care provider. Make sure you discuss any questions you have with your health care provider. ° °Laceration Care, Adult °A laceration is a cut or lesion that goes through all layers of the skin and into the tissue just beneath the skin. °TREATMENT  °Some lacerations may not require closure. Some lacerations may not be able to be closed due to an increased risk of infection. It is important to see your caregiver as soon as possible after an injury to minimize the risk of infection and maximize the opportunity for successful closure. °If closure is appropriate, pain medicines may be given, if needed. The wound will be cleaned to help prevent infection. Your caregiver will use stitches (sutures), staples, wound glue (adhesive), or skin adhesive strips to repair the laceration. These tools bring the skin edges together to allow for faster healing and a better cosmetic outcome. However, all wounds will heal with a scar. Once the wound has healed, scarring can be minimized by covering the wound with sunscreen during the day for 1 full year. °HOME CARE INSTRUCTIONS  °For sutures or staples: °· Keep the wound clean and dry. °· If you were given a bandage (dressing), you should change it at least once a day. Also, change the dressing if it becomes wet or dirty, or as directed by your caregiver. °· Wash the wound with soap and water 2 times a day. Rinse the wound off with water to remove all soap. Pat the wound dry with a clean towel. °· After cleaning, apply a thin layer of the antibiotic ointment as recommended by your caregiver. This will help prevent infection and keep the dressing from sticking. °· You may shower as usual after the first 24 hours. Do not soak the wound in water until the sutures are removed. °· Only take over-the-counter or prescription medicines for pain,  discomfort, or fever as directed by your caregiver. °· Get your sutures or staples removed as directed by your caregiver. °For skin adhesive strips: °· Keep the wound clean and dry. °· Do not get the skin adhesive strips wet. You may bathe carefully, using caution to keep the wound dry. °· If the wound gets wet, pat it dry with a clean towel. °· Skin adhesive strips will fall off on their own. You may trim the strips as the wound heals. Do not remove skin adhesive strips that are still stuck to the wound. They will fall off in time. °For wound adhesive: °· You may briefly wet your wound in the shower or bath. Do not soak or scrub the wound. Do not swim. Avoid periods of heavy perspiration until the skin adhesive has fallen off on its own. After showering or bathing, gently pat the wound dry with a clean towel. °· Do not apply liquid medicine, cream medicine, or ointment medicine to your wound while the skin adhesive is in place. This may loosen the film before your wound is healed. °· If a dressing is placed over the wound, be careful not   to apply tape directly over the skin adhesive. This may cause the adhesive to be pulled off before the wound is healed. °· Avoid prolonged exposure to sunlight or tanning lamps while the skin adhesive is in place. Exposure to ultraviolet light in the first year will darken the scar. °· The skin adhesive will usually remain in place for 5 to 10 days, then naturally fall off the skin. Do not pick at the adhesive film. °You may need a tetanus shot if: °· You cannot remember when you had your last tetanus shot. °· You have never had a tetanus shot. °If you get a tetanus shot, your arm may swell, get red, and feel warm to the touch. This is common and not a problem. If you need a tetanus shot and you choose not to have one, there is a rare chance of getting tetanus. Sickness from tetanus can be serious. °SEEK MEDICAL CARE IF:  °· You have redness, swelling, or increasing pain in the  wound. °· You see a red line that goes away from the wound. °· You have yellowish-white fluid (pus) coming from the wound. °· You have a fever. °· You notice a bad smell coming from the wound or dressing. °· Your wound breaks open before or after sutures have been removed. °· You notice something coming out of the wound such as wood or glass. °· Your wound is on your hand or foot and you cannot move a finger or toe. °SEEK IMMEDIATE MEDICAL CARE IF:  °· Your pain is not controlled with prescribed medicine. °· You have severe swelling around the wound causing pain and numbness or a change in color in your arm, hand, leg, or foot. °· Your wound splits open and starts bleeding. °· You have worsening numbness, weakness, or loss of function of any joint around or beyond the wound. °· You develop painful lumps near the wound or on the skin anywhere on your body. °MAKE SURE YOU:  °· Understand these instructions. °· Will watch your condition. °· Will get help right away if you are not doing well or get worse. °Document Released: 06/24/2005 Document Revised: 09/16/2011 Document Reviewed: 12/18/2010 °ExitCare® Patient Information ©2015 [employer]. This information is not intended to replace advice given to you by your health care provider. Make sure you discuss any questions you have with your health care provider. ° °

## 2015-03-13 NOTE — ED Notes (Signed)
Pt alert & oriented x4, stable gait. Patient  given discharge instructions, paperwork & prescription(s). Patient verbalized understanding. Pt left department in wheelchair escorted by ED staff w/ no further questions.

## 2015-03-13 NOTE — ED Notes (Signed)
Secretary called [facility] to pick pt up now that he is discharged.

## 2015-03-13 NOTE — ED Notes (Signed)
Report given to [facility] who picked Pt up.

## 2015-03-14 ENCOUNTER — Telehealth: Payer: Self-pay | Admitting: Internal Medicine

## 2015-03-14 NOTE — Telephone Encounter (Signed)
Miranda from [facility] called and states Pt fell over the weekend and he has abrasions on his forehead and stitches on his left knee.  She would like approval to be able to clean with soap and water. Her # is [phone]

## 2015-03-15 NOTE — Telephone Encounter (Signed)
Calling to give verbal. LMOVM

## 2015-03-15 NOTE — Telephone Encounter (Signed)
Yes

## 2015-03-15 NOTE — Telephone Encounter (Signed)
Ok to give verbal 

## 2015-03-16 DIAGNOSIS — E118 Type 2 diabetes mellitus with unspecified complications: Secondary | ICD-10-CM | POA: Diagnosis not present

## 2015-03-16 NOTE — Telephone Encounter (Signed)
Informed of verbal ok

## 2015-03-20 ENCOUNTER — Telehealth: Payer: Self-pay | Admitting: Internal Medicine

## 2015-03-20 NOTE — Telephone Encounter (Signed)
Pt no longer has transportation and needs something sent to [facility] stating its medically necessary for Medicaid transportation to bring him to his upcoming appt. Fax # [phone]

## 2015-03-20 NOTE — Telephone Encounter (Signed)
Done

## 2015-03-21 ENCOUNTER — Telehealth: Payer: Self-pay

## 2015-03-21 NOTE — Telephone Encounter (Signed)
Letter needed. 

## 2015-03-21 NOTE — Telephone Encounter (Signed)
Done, Judeth Cornfield wrote another letter

## 2015-03-21 NOTE — Telephone Encounter (Signed)
Can you help with this?

## 2015-03-21 NOTE — Telephone Encounter (Signed)
Patient's facility received the note but it did not say it was medically necessary to have this done, and without this they will not pick him up.  They need this note by 3:30pm today.

## 2015-03-21 NOTE — Telephone Encounter (Signed)
Letter has faxed to Lupita Leash at [phone]

## 2015-03-22 ENCOUNTER — Emergency Department (HOSPITAL_COMMUNITY)
Admission: EM | Admit: 2015-03-22 | Discharge: 2015-03-22 | Disposition: A | Discharge status: Home / Self Care | Payer: Commercial Managed Care - HMO | Attending: Emergency Medicine | Admitting: Emergency Medicine

## 2015-03-22 ENCOUNTER — Ambulatory Visit: Payer: Commercial Managed Care - HMO | Admitting: Internal Medicine

## 2015-03-22 ENCOUNTER — Encounter (HOSPITAL_COMMUNITY): Payer: Self-pay | Admitting: *Deleted

## 2015-03-22 DIAGNOSIS — Z872 Personal history of diseases of the skin and subcutaneous tissue: Secondary | ICD-10-CM | POA: Insufficient documentation

## 2015-03-22 DIAGNOSIS — I5022 Chronic systolic (congestive) heart failure: Secondary | ICD-10-CM | POA: Insufficient documentation

## 2015-03-22 DIAGNOSIS — M1990 Unspecified osteoarthritis, unspecified site: Secondary | ICD-10-CM | POA: Diagnosis not present

## 2015-03-22 DIAGNOSIS — I10 Essential (primary) hypertension: Secondary | ICD-10-CM | POA: Diagnosis not present

## 2015-03-22 DIAGNOSIS — E119 Type 2 diabetes mellitus without complications: Secondary | ICD-10-CM | POA: Insufficient documentation

## 2015-03-22 DIAGNOSIS — Z9889 Other specified postprocedural states: Secondary | ICD-10-CM | POA: Insufficient documentation

## 2015-03-22 DIAGNOSIS — Z79899 Other long term (current) drug therapy: Secondary | ICD-10-CM | POA: Diagnosis not present

## 2015-03-22 DIAGNOSIS — Z87891 Personal history of nicotine dependence: Secondary | ICD-10-CM | POA: Insufficient documentation

## 2015-03-22 DIAGNOSIS — F322 Major depressive disorder, single episode, severe without psychotic features: Secondary | ICD-10-CM | POA: Diagnosis not present

## 2015-03-22 DIAGNOSIS — Z87440 Personal history of urinary (tract) infections: Secondary | ICD-10-CM | POA: Insufficient documentation

## 2015-03-22 DIAGNOSIS — Z87442 Personal history of urinary calculi: Secondary | ICD-10-CM | POA: Insufficient documentation

## 2015-03-22 DIAGNOSIS — Z4802 Encounter for removal of sutures: Secondary | ICD-10-CM | POA: Diagnosis present

## 2015-03-22 DIAGNOSIS — I2510 Atherosclerotic heart disease of native coronary artery without angina pectoris: Secondary | ICD-10-CM | POA: Insufficient documentation

## 2015-03-22 DIAGNOSIS — F419 Anxiety disorder, unspecified: Secondary | ICD-10-CM | POA: Diagnosis not present

## 2015-03-22 NOTE — ED Notes (Signed)
Here for suture removal

## 2015-03-22 NOTE — Discharge Instructions (Signed)
Suture Removal, Care After °Refer to this sheet in the next few weeks. These instructions provide you with information on caring for yourself after your procedure. Your health care provider may also give you more specific instructions. Your treatment has been planned according to current medical practices, but problems sometimes occur. Call your health care provider if you have any problems or questions after your procedure. °WHAT TO EXPECT AFTER THE PROCEDURE °After your stitches (sutures) are removed, it is typical to have the following: °· Some discomfort and swelling in the wound area. °· Slight redness in the area. °HOME CARE INSTRUCTIONS  °· If you have skin adhesive strips over the wound area, do not take the strips off. They will fall off on their own in a few days. If the strips remain in place after 14 days, you may remove them. °· Change any bandages (dressings) at least once a day or as directed by your health care provider. If the bandage sticks, soak it off with warm, soapy water. °· Apply cream or ointment only as directed by your health care provider. If using cream or ointment, wash the area with soap and water 2 times a day to remove all the cream or ointment. Rinse off the soap and pat the area dry with a clean towel. °· Keep the wound area dry and clean. If the bandage becomes wet or dirty, or if it develops a bad smell, change it as soon as possible. °· Continue to protect the wound from injury. °· Use sunscreen when out in the sun. New scars become sunburned easily. °SEEK MEDICAL CARE IF: °· You have increasing redness, swelling, or pain in the wound. °· You see pus coming from the wound. °· You have a fever. °· You notice a bad smell coming from the wound or dressing. °· Your wound breaks open (edges not staying together). °Document Released: 03/19/2001 Document Revised: 04/14/2013 Document Reviewed: 02/03/2013 °ExitCare® Patient Information ©2015 [employer]. This information is not  intended to replace advice given to you by your health care provider. Make sure you discuss any questions you have with your health care provider. ° °

## 2015-03-22 NOTE — ED Notes (Signed)
States CBg at home was 135

## 2015-03-22 NOTE — ED Notes (Signed)
3 sutures removed from left knee. Edges well approximated. No bleeding/drainage/redness noted.

## 2015-03-22 NOTE — ED Notes (Signed)
Julie-PA at bedside for evaluation.

## 2015-03-23 ENCOUNTER — Other Ambulatory Visit: Payer: Self-pay | Admitting: Internal Medicine
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
## 2015-04-05 ENCOUNTER — Ambulatory Visit: Payer: Commercial Managed Care - HMO | Admitting: Internal Medicine

## 2015-04-06 ENCOUNTER — Telehealth: Payer: Self-pay | Admitting: Internal Medicine

## 2015-04-06 NOTE — Telephone Encounter (Signed)
Patient no showed for fu on 9/28.  Please advise.

## 2015-04-19 ENCOUNTER — Other Ambulatory Visit: Payer: Self-pay | Admitting: Internal Medicine

## 2015-04-25 ENCOUNTER — Other Ambulatory Visit (INDEPENDENT_AMBULATORY_CARE_PROVIDER_SITE_OTHER): Payer: Commercial Managed Care - HMO

## 2015-04-25 ENCOUNTER — Encounter: Payer: Self-pay | Admitting: Internal Medicine

## 2015-04-25 ENCOUNTER — Ambulatory Visit (INDEPENDENT_AMBULATORY_CARE_PROVIDER_SITE_OTHER): Payer: Commercial Managed Care - HMO | Admitting: Internal Medicine

## 2015-04-25 VITALS — BP 138/80 | HR 60 | Temp 97.8°F | Resp 16 | Ht 70.0 in | Wt 246.0 lb

## 2015-04-25 DIAGNOSIS — M160 Bilateral primary osteoarthritis of hip: Secondary | ICD-10-CM | POA: Diagnosis not present

## 2015-04-25 DIAGNOSIS — F32A Depression, unspecified: Secondary | ICD-10-CM

## 2015-04-25 DIAGNOSIS — E785 Hyperlipidemia, unspecified: Secondary | ICD-10-CM

## 2015-04-25 DIAGNOSIS — Z794 Long term (current) use of insulin: Secondary | ICD-10-CM

## 2015-04-25 DIAGNOSIS — F450 Somatization disorder: Secondary | ICD-10-CM

## 2015-04-25 DIAGNOSIS — F329 Major depressive disorder, single episode, unspecified: Secondary | ICD-10-CM

## 2015-04-25 DIAGNOSIS — I10 Essential (primary) hypertension: Secondary | ICD-10-CM

## 2015-04-25 DIAGNOSIS — M170 Bilateral primary osteoarthritis of knee: Secondary | ICD-10-CM

## 2015-04-25 DIAGNOSIS — E118 Type 2 diabetes mellitus with unspecified complications: Secondary | ICD-10-CM

## 2015-04-25 LAB — LIPID PANEL
Cholesterol: 94 mg/dL (ref 0–200)
HDL: 45.3 mg/dL
LDL Cholesterol: 34 mg/dL (ref 0–99)
NonHDL: 48.84
Total CHOL/HDL Ratio: 2
Triglycerides: 74 mg/dL (ref 0.0–149.0)
VLDL: 14.8 mg/dL (ref 0.0–40.0)

## 2015-04-25 LAB — COMPREHENSIVE METABOLIC PANEL
ALT: 21 U/L (ref 0–53)
AST: 21 U/L (ref 0–37)
Albumin: 4.4 g/dL (ref 3.5–5.2)
Alkaline Phosphatase: 60 U/L (ref 39–117)
BUN: 17 mg/dL (ref 6–23)
CO2: 34 mEq/L — ABNORMAL HIGH (ref 19–32)
Calcium: 9.9 mg/dL (ref 8.4–10.5)
Chloride: 105 mEq/L (ref 96–112)
Creatinine, Ser: 0.89 mg/dL (ref 0.40–1.50)
GFR: 91.33 mL/min (ref >60.00)
Glucose, Bld: 111 mg/dL — ABNORMAL HIGH (ref 70–99)
Potassium: 4.7 mEq/L (ref 3.5–5.1)
Sodium: 143 mEq/L (ref 135–145)
Total Bilirubin: 0.8 mg/dL (ref 0.2–1.2)
Total Protein: 7.5 g/dL (ref 6.0–8.3)

## 2015-04-25 LAB — HEMOGLOBIN A1C: Hgb A1c MFr Bld: 6.1 % (ref 4.6–6.5)

## 2015-04-25 LAB — TSH: TSH: 0.43 u[IU]/mL (ref 0.35–4.50)

## 2015-04-25 MED ORDER — CLONAZEPAM 1 MG PO TABS: 1.0000 mg | ORAL_TABLET | Freq: Two times a day (BID) | ORAL | Status: DC | PRN

## 2015-04-25 MED ORDER — HYDROCODONE-ACETAMINOPHEN 10-325 MG PO TABS: 1.0000 | ORAL_TABLET | Freq: Three times a day (TID) | ORAL | Status: DC | PRN

## 2015-04-25 NOTE — Patient Instructions (Signed)
Type 2 Diabetes Mellitus, Adult °Type 2 diabetes mellitus, often simply referred to as type 2 diabetes, is a long-lasting (chronic) disease. In type 2 diabetes, the pancreas does not make enough insulin (a hormone), the cells are less responsive to the insulin that is made (insulin resistance), or both. Normally, insulin moves sugars from food into the tissue cells. The tissue cells use the sugars for energy. The lack of insulin or the lack of normal response to insulin causes excess sugars to build up in the blood instead of going into the tissue cells. As a result, high blood sugar (hyperglycemia) develops. The effect of high sugar (glucose) levels can cause many complications.  °Type 2 diabetes was also previously called adult-onset diabetes, but it can occur at any age.    °RISK FACTORS  °A person is predisposed to developing type 2 diabetes if someone in the family has the disease and also has one or more of the following primary risk factors: °· Weight gain, or being overweight or obese. °· An inactive lifestyle. °· A history of consistently eating high-calorie foods. °Maintaining a normal weight and regular physical activity can reduce the chance of developing type 2 diabetes. °SYMPTOMS  °A person with type 2 diabetes may not show symptoms initially. The symptoms of type 2 diabetes appear slowly. The symptoms include: °· Increased thirst (polydipsia). °· Increased urination (polyuria). °· Increased urination during the night (nocturia). °· Sudden or unexplained weight changes. °· Frequent, recurring infections. °· Tiredness (fatigue). °· Weakness. °· Vision changes, such as blurred vision. °· Fruity smell to your breath. °· Abdominal pain. °· Nausea or vomiting. °· Cuts or bruises which are slow to heal. °· Tingling or numbness in the hands or feet. °· An open skin wound (ulcer). °DIAGNOSIS °Type 2 diabetes is frequently not diagnosed until complications of diabetes are present. Type 2 diabetes is diagnosed  when symptoms or complications are present and when blood glucose levels are increased. Your blood glucose level may be checked by one or more of the following blood tests: °· A fasting blood glucose test. You will not be allowed to eat for at least 8 hours before a blood sample is taken. °· A random blood glucose test. Your blood glucose is checked at any time of the day regardless of when you ate. °· A hemoglobin A1c blood glucose test. A hemoglobin A1c test provides information about blood glucose control over the previous 3 months. °· An oral glucose tolerance test (OGTT). Your blood glucose is measured after you have not eaten (fasted) for 2 hours and then after you drink a glucose-containing beverage. °TREATMENT  °· You may need to take insulin or diabetes medicine daily to keep blood glucose levels in the desired range. °· If you use insulin, you may need to adjust the dosage depending on the carbohydrates that you eat with each meal or snack. °· Lifestyle changes are recommended as part of your treatment. These may include: °¨ Following an individualized diet plan developed by a nutritionist or dietitian. °¨ Exercising daily. °Your health care providers will set individualized treatment goals for you based on your age, your medicines, how long you have had diabetes, and any other medical conditions you have. Generally, the goal of treatment is to maintain the following blood glucose levels: °· Before meals (preprandial): 80-130 mg/dL. °· After meals (postprandial): below 180 mg/dL. °· A1c: less than 6.5-7%. °HOME CARE INSTRUCTIONS  °· Have your hemoglobin A1c level checked twice a year. °· Perform daily blood glucose monitoring   as directed by your health care provider. °· Monitor urine ketones when you are ill and as directed by your health care provider. °· Take your diabetes medicine or insulin as directed by your health care provider to maintain your blood glucose levels in the desired range. °¨ Never run  out of diabetes medicine or insulin. It is needed every day. °¨ If you are using insulin, you may need to adjust the amount of insulin given based on your intake of carbohydrates. Carbohydrates can raise blood glucose levels but need to be included in your diet. Carbohydrates provide vitamins, minerals, and fiber which are an essential part of a healthy diet. Carbohydrates are found in fruits, vegetables, whole grains, dairy products, legumes, and foods containing added sugars. °· Eat healthy foods. You should make an appointment to see a registered dietitian to help you create an eating plan that is right for you. °· Lose weight if you are overweight. °· Carry a medical alert card or wear your medical alert jewelry. °· Carry a 15-gram carbohydrate snack with you at all times to treat low blood glucose (hypoglycemia). Some examples of 15-gram carbohydrate snacks include: °¨ Glucose tablets, 3 or 4. °¨ Glucose gel, 15-gram tube. °¨ Raisins, 2 tablespoons (24 grams). °¨ Jelly beans, 6. °¨ Animal crackers, 8. °¨ Regular pop, 4 ounces (120 mL). °¨ Gummy treats, 9. °· Recognize hypoglycemia. Hypoglycemia occurs with blood glucose levels of 70 mg/dL and below. The risk for hypoglycemia increases when fasting or skipping meals, during or after intense exercise, and during sleep. Hypoglycemia symptoms can include: °¨ Tremors or shakes. °¨ Decreased ability to concentrate. °¨ Sweating. °¨ Increased heart rate. °¨ Headache. °¨ Dry mouth. °¨ Hunger. °¨ Irritability. °¨ Anxiety. °¨ Restless sleep. °¨ Altered speech or coordination. °¨ Confusion. °· Treat hypoglycemia promptly. If you are alert and able to safely swallow, follow the 15:15 rule: °¨ Take 15-20 grams of rapid-acting glucose or carbohydrate. Rapid-acting options include glucose gel, glucose tablets, or 4 ounces (120 mL) of fruit juice, regular soda, or low-fat milk. °¨ Check your blood glucose level 15 minutes after taking the glucose. °¨ Take 15-20 grams more of  glucose if the repeat blood glucose level is still 70 mg/dL or below. °¨ Eat a meal or snack within 1 hour once blood glucose levels return to normal. °· Be alert to feeling very thirsty and urinating more frequently than usual, which are early signs of hyperglycemia. An early awareness of hyperglycemia allows for prompt treatment. Treat hyperglycemia as directed by your health care provider. °· Engage in at least 150 minutes of moderate-intensity physical activity a week, spread over at least 3 days of the week or as directed by your health care provider. In addition, you should engage in resistance exercise at least 2 times a week or as directed by your health care provider. Try to spend no more than 90 minutes at one time inactive. °· Adjust your medicine and food intake as needed if you start a new exercise or sport. °· Follow your sick-day plan anytime you are unable to eat or drink as usual. °· Do not use any tobacco products including cigarettes, chewing tobacco, or electronic cigarettes. If you need help quitting, ask your health care provider. °· Limit alcohol intake to no more than 1 drink per day for nonpregnant women and 2 drinks per day for men. You should drink alcohol only when you are also eating food. Talk with your health care provider whether alcohol is safe   for you. Tell your health care provider if you drink alcohol several times a week. °· Keep all follow-up visits as directed by your health care provider. This is important. °· Schedule an eye exam soon after the diagnosis of type 2 diabetes and then annually. °· Perform daily skin and foot care. Examine your skin and feet daily for cuts, bruises, redness, nail problems, bleeding, blisters, or sores. A foot exam by a health care provider should be done annually. °· Brush your teeth and gums at least twice a day and floss at least once a day. Follow up with your dentist regularly. °· Share your diabetes management plan with your workplace or  school. °· Keep your immunizations up to date. It is recommended that you receive a flu (influenza) vaccine every year. It is also recommended that you receive a pneumonia (pneumococcal) vaccine. If you are 65 years of age or older and have never received a pneumonia vaccine, this vaccine may be given as a series of two separate shots. Ask your health care provider which additional vaccines may be recommended. °· Learn to manage stress. °· Obtain ongoing diabetes education and support as needed. °· Participate in or seek rehabilitation as needed to maintain or improve independence and quality of life. Request a physical or occupational therapy referral if you are having foot or hand numbness, or difficulties with grooming, dressing, eating, or physical activity. °SEEK MEDICAL CARE IF:  °· You are unable to eat food or drink fluids for more than 6 hours. °· You have nausea and vomiting for more than 6 hours. °· Your blood glucose level is over 240 mg/dL. °· There is a change in mental status. °· You develop an additional serious illness. °· You have diarrhea for more than 6 hours. °· You have been sick or have had a fever for a couple of days and are not getting better. °· You have pain during any physical activity.   °SEEK IMMEDIATE MEDICAL CARE IF: °· You have difficulty breathing. °· You have moderate to large ketone levels. °  °This information is not intended to replace advice given to you by your health care provider. Make sure you discuss any questions you have with your health care provider. °  °Document Released: 06/24/2005 Document Revised: 03/15/2015 Document Reviewed: 01/21/2012 °Elsevier Interactive Patient Education ©2016 [employer] ° °

## 2015-04-25 NOTE — Progress Notes (Signed)
Subjective:  Patient ID: Cody Hale, male    DOB: May 24, 1951  Age: 64 y.o. MRN: [id number]  CC: Diabetes and Depression   HPI Cody Hale presents for f/up but he complains of anxiety, panic, insomnia, weight loss, loss of appetite, and sadness since his wife left him a few weeks ago.  Outpatient Prescriptions Prior to Visit  Medication Sig Dispense Refill  . Blood Glucose Monitoring Suppl (ONETOUCH VERIO IQ SYSTEM) W/DEVICE KIT 1 Act by Does not apply route 3 (three) times daily. 2 kit 0  . carvedilol (COREG) 6.25 MG tablet TAKE 1 TABLET BY MOUTH TWICE DAILY. 60 tablet 11  . furosemide (LASIX) 20 MG tablet Take 1 tablet (20 mg total) by mouth daily. 90 tablet 3  . glucose blood test strip Use TID 100 each 12  . JANUMET 50-1000 MG per tablet TAKE 1 TABLET BY MOUTH TWICE DAILY WITH MEALS. 60 tablet 11  . Lancets (ONETOUCH ULTRASOFT) lancets Use to help check blood sugars twice a day E11.8 100 each 3  . Multiple Vitamin (DAILY VITE) TABS TAKE 1 TABLET BY MOUTH ONCE DAILY. 30 tablet 11  . nitroGLYCERIN (NITROSTAT) 0.4 MG SL tablet Place 1 tablet (0.4 mg total) under the tongue every 5 (five) minutes x 3 doses as needed for chest pain. 25 tablet 3  . potassium chloride SA (K-DUR,KLOR-CON) 10 MEQ tablet Take 1 tablet (10 mEq total) by mouth daily. 90 tablet 3  . simvastatin (ZOCOR) 20 MG tablet TAKE 1 TABLET BY MOUTH ONCE DAILY. 30 tablet 11  . Vilazodone HCl (VIIBRYD) 40 MG TABS Take 1 tablet (40 mg total) by mouth daily. 30 tablet 11  . XARELTO 20 MG TABS tablet TAKE 1 TABLET BY MOUTH ONCE DAILY. 30 tablet 5  . HYDROcodone-acetaminophen (NORCO) 10-325 MG per tablet Take 1 tablet by mouth every 8 (eight) hours as needed. 75 tablet 0  . INVOKANA 300 MG TABS tablet TAKE 1 TABLET BY MOUTH ONCE DAILY. 30 tablet 11   No facility-administered medications prior to visit.    ROS Review of Systems  Constitutional: Positive for appetite change and unexpected weight change. Negative for  fever, chills, diaphoresis, activity change and fatigue.  HENT: Negative.  Negative for sore throat, trouble swallowing and voice change.   Eyes: Negative.   Respiratory: Positive for apnea. Negative for cough, choking, chest tightness, shortness of breath and stridor.   Cardiovascular: Negative.  Negative for chest pain, palpitations and leg swelling.  Gastrointestinal: Negative.  Negative for nausea, vomiting, abdominal pain, diarrhea, constipation and blood in stool.  Endocrine: Negative.   Genitourinary: Negative.   Musculoskeletal: Positive for arthralgias. Negative for myalgias and neck pain.  Skin: Negative.   Allergic/Immunologic: Negative.   Neurological: Negative.   Hematological: Negative.  Negative for adenopathy. Does not bruise/bleed easily.  Psychiatric/Behavioral: Positive for dysphoric mood. Negative for suicidal ideas, hallucinations, behavioral problems, confusion, self-injury, decreased concentration and agitation. The patient is nervous/anxious. The patient is not hyperactive.     Objective:  BP 138/80 mmHg  Pulse 60  Temp(Src) 97.8 F (36.6 C) (Oral)  Resp 16  Ht 5' 10"  (1.778 m)  Wt 246 lb (111.585 kg)  BMI 35.30 kg/m2  SpO2 97%  BP Readings from Last 3 Encounters:  04/25/15 138/80  03/22/15 126/90  03/13/15 135/78    Wt Readings from Last 3 Encounters:  04/25/15 246 lb (111.585 kg)  03/22/15 253 lb (114.76 kg)  03/12/15 250 lb (113.399 kg)    Physical Exam  Constitutional: He is oriented to person, place, and time. He appears well-developed and well-nourished. No distress.  HENT:  Head: Normocephalic and atraumatic.  Mouth/Throat: Oropharynx is clear and moist. No oropharyngeal exudate.  Eyes: Conjunctivae are normal. Right eye exhibits no discharge. Left eye exhibits no discharge. No scleral icterus.  Neck: Normal range of motion. Neck supple. No JVD present. No tracheal deviation present. No thyromegaly present.  Cardiovascular: Normal rate,  regular rhythm, normal heart sounds and intact distal pulses.  Exam reveals no gallop and no friction rub.   No murmur heard. Pulmonary/Chest: Effort normal and breath sounds normal. No stridor. No respiratory distress. He has no wheezes. He has no rales. He exhibits no tenderness.  Abdominal: Soft. Bowel sounds are normal. He exhibits no distension and no mass. There is no tenderness. There is no rebound and no guarding.  Musculoskeletal: Normal range of motion. He exhibits edema (trace pitting edema in BLE). He exhibits no tenderness.  Lymphadenopathy:    He has no cervical adenopathy.  Neurological: He is oriented to person, place, and time.  Skin: Skin is warm and dry. No rash noted. He is not diaphoretic. No erythema. No pallor.  Psychiatric: His behavior is normal. Judgment and thought content normal. His mood appears anxious. His affect is not angry, not blunt, not labile and not inappropriate. His speech is not rapid and/or pressured, not delayed, not tangential and not slurred. He does not exhibit a depressed mood. He expresses no homicidal and no suicidal ideation. He expresses no suicidal plans and no homicidal plans. He is communicative.  Vitals reviewed.   Lab Results  Component Value Date   WBC 5.1 01/25/2015   HGB 14.4 01/25/2015   HCT 44.1 01/25/2015   PLT 141* 01/25/2015   GLUCOSE 111* 04/25/2015   CHOL 94 04/25/2015   TRIG 74.0 04/25/2015   HDL 45.30 04/25/2015   LDLDIRECT 84.2 01/31/2014   LDLCALC 34 04/25/2015   ALT 21 04/25/2015   AST 21 04/25/2015   NA 143 04/25/2015   K 4.7 04/25/2015   CL 105 04/25/2015   CREATININE 0.89 04/25/2015   BUN 17 04/25/2015   CO2 34* 04/25/2015   TSH 0.43 04/25/2015   PSA 0.36 05/30/2014   INR 1.13 11/30/2012   HGBA1C 6.1 04/25/2015   MICROALBUR 0.7 11/22/2014    No results found.  Assessment & Plan:   Cody Hale was seen today for diabetes and depression.  Diagnoses and all orders for this visit:  Primary osteoarthritis  of both knees -     HYDROcodone-acetaminophen (NORCO) 10-325 MG tablet; Take 1 tablet by mouth every 8 (eight) hours as needed.  Primary osteoarthritis of both hips -     HYDROcodone-acetaminophen (NORCO) 10-325 MG tablet; Take 1 tablet by mouth every 8 (eight) hours as needed.  Essential hypertension, malignant- his BP is well controlled, lytes and renal function are stable -     Comprehensive metabolic panel; Future  Type 2 diabetes mellitus with complication, with long-term current use of insulin ([place])- his A1C is down to 6.1% so I have recommended that he stop taking invokana, will cont janumet -     Comprehensive metabolic panel; Future -     Hemoglobin A1c; Future  Hyperlipidemia with target LDL less than 70- he has achieved his LDL goal and is doing well on the statin -     Comprehensive metabolic panel; Future -     TSH; Future -     Lipid panel; Future  Hypocalcemia- this  is normal now -     Comprehensive metabolic panel; Future  Depression with somatization- will cont Viibryd and will start klonopin for panic/anxiety -     clonazePAM (KLONOPIN) 1 MG tablet; Take 1 tablet (1 mg total) by mouth 2 (two) times daily as needed for anxiety.   I have discontinued Mr. Lambros Cerro. I have also changed his HYDROcodone-acetaminophen. Additionally, I am having him start on clonazePAM. Lastly, I am having him maintain his nitroGLYCERIN, glucose blood, ONETOUCH VERIO IQ SYSTEM, onetouch ultrasoft, furosemide, potassium chloride, Vilazodone HCl, DAILY VITE, JANUMET, XARELTO, carvedilol, and simvastatin.  Meds ordered this encounter  Medications  . HYDROcodone-acetaminophen (NORCO) 10-325 MG tablet    Sig: Take 1 tablet by mouth every 8 (eight) hours as needed.    Dispense:  75 tablet    Refill:  0  . clonazePAM (KLONOPIN) 1 MG tablet    Sig: Take 1 tablet (1 mg total) by mouth 2 (two) times daily as needed for anxiety.    Dispense:  60 tablet    Refill:  5     Follow-up:  Return in about 4 months (around 08/26/2015).  Scarlette Calico, MD

## 2015-04-25 NOTE — Progress Notes (Signed)
Pre visit review using our clinic review tool, if applicable. No additional management support is needed unless otherwise documented below in the visit note. 

## 2015-04-26 ENCOUNTER — Encounter: Payer: Self-pay | Admitting: Internal Medicine

## 2015-05-01 ENCOUNTER — Telehealth: Payer: Self-pay | Admitting: Internal Medicine

## 2015-05-01 NOTE — Telephone Encounter (Signed)
Following up on orders faxed last week.  Does not have to refax.

## 2015-05-02 NOTE — Telephone Encounter (Signed)
Lupita Leash from [facility] is calling back to follow up on the orders. She refaxed them yesterday

## 2015-05-03 NOTE — Telephone Encounter (Signed)
I was out, do not see order. Will f/u incase it needs to be refaxed

## 2015-05-04 NOTE — Telephone Encounter (Signed)
States was faxed this morning.  Would like follow up call in regards.

## 2015-05-04 NOTE — Telephone Encounter (Signed)
Response faxed.

## 2015-05-04 NOTE — Telephone Encounter (Signed)
The director called in regards.  States needs faxed back today.

## 2015-05-25 ENCOUNTER — Other Ambulatory Visit: Payer: Self-pay | Admitting: Internal Medicine

## 2015-06-07 ENCOUNTER — Telehealth: Payer: Self-pay

## 2015-06-07 NOTE — Telephone Encounter (Signed)
I don't have any helpful options to remedy this

## 2015-06-07 NOTE — Telephone Encounter (Signed)
Nursing calling states pt only receives 66.00 a month and wondering if some meds can be switched such as Janumet, Xarelto, or any other high cost med on list. To help pt with other cost during month. I advised of copay card. Cody Hale states they were using everything at pharmacy. Plase advise.

## 2015-06-13 ENCOUNTER — Ambulatory Visit (INDEPENDENT_AMBULATORY_CARE_PROVIDER_SITE_OTHER): Payer: Commercial Managed Care - HMO | Admitting: *Deleted

## 2015-06-13 DIAGNOSIS — Z5181 Encounter for therapeutic drug level monitoring: Secondary | ICD-10-CM | POA: Diagnosis not present

## 2015-06-13 DIAGNOSIS — I4891 Unspecified atrial fibrillation: Secondary | ICD-10-CM

## 2015-06-13 LAB — BASIC METABOLIC PANEL
BUN: 17 mg/dL (ref 7–25)
CO2: 29 mmol/L (ref 20–31)
Calcium: 9.2 mg/dL (ref 8.6–10.3)
Chloride: 102 mmol/L (ref 98–110)
Creat: 0.81 mg/dL (ref 0.70–1.25)
Glucose, Bld: 100 mg/dL — ABNORMAL HIGH (ref 65–99)
Potassium: 4.4 mmol/L (ref 3.5–5.3)
Sodium: 141 mmol/L (ref 135–146)

## 2015-06-13 NOTE — Progress Notes (Signed)
Pt was started on Xarelto 20mg  qhs for atrial fib on 05/06/14 by Dr Purvis Sheffield.   Pt denies any problems taking Xarelto. Denies andy unusual bleeding, bruising or GI upset.  Reviewed patients medication list. Pt is not currently on any combined P-gp and strong CYP3A4 inhibitors/inducers (ketoconazole, traconazole, ritonavir, carbamazepine, phenytoin, rifampin, St. John's wort). Reviewed labs from 06/13/15: SCr 0.81, Weight 246, CrCl 145.41 Dose is appropriate based on CrCl. Hgb and HCT: 15.6/48.3   A full discussion of the nature of anticoagulants has been carried out. A benefit/risk analysis has been presented to the patient, so that they understand the justification for choosing anticoagulation with Xarelto at this time. The need for compliance is stressed. Pt is aware to take the medication once daily with the largest meal of the day. Side effects of potential bleeding are discussed, including unusual colored urine or stools, coughing up blood or coffee ground emesis, nose bleeds or serious fall or head trauma. Discussed signs and symptoms of stroke. The patient should avoid any OTC items containing aspirin or ibuprofen. Avoid alcohol consumption. Call if any signs of abnormal bleeding. Discussed financial obligations and resolved any difficulty in obtaining medication. Next lab test in 6 months. Placed in recall for 6/17.

## 2015-06-14 ENCOUNTER — Telehealth: Payer: Self-pay

## 2015-06-14 ENCOUNTER — Telehealth: Payer: Self-pay | Admitting: *Deleted

## 2015-06-14 LAB — CBC
HCT: 48.3 % (ref 39.0–52.0)
Hemoglobin: 15.6 g/dL (ref 13.0–17.0)
MCH: 30.2 pg (ref 26.0–34.0)
MCHC: 32.3 g/dL (ref 30.0–36.0)
MCV: 93.6 fL (ref 78.0–100.0)
Platelets: 118 10*3/uL — ABNORMAL LOW (ref 150–400)
RBC: 5.16 MIL/uL (ref 4.22–5.81)
RDW: 14.1 % (ref 11.5–15.5)
WBC: 5.4 10*3/uL (ref 4.0–10.5)

## 2015-06-14 NOTE — Telephone Encounter (Signed)
Error

## 2015-06-14 NOTE — Telephone Encounter (Signed)
-----   Message from Laqueta Linden, MD sent at 06/14/2015  6:49 AM EST ----- Mild thrombocytopenia. Improved since July.

## 2015-06-14 NOTE — Telephone Encounter (Signed)
Called patient with test results. No answer. Left message to call back.  

## 2015-06-14 NOTE — Telephone Encounter (Signed)
  [id number]

## 2015-07-17 ENCOUNTER — Ambulatory Visit: Payer: Self-pay | Admitting: Internal Medicine

## 2015-07-20 ENCOUNTER — Encounter: Payer: Self-pay | Admitting: Internal Medicine

## 2015-07-20 ENCOUNTER — Other Ambulatory Visit (INDEPENDENT_AMBULATORY_CARE_PROVIDER_SITE_OTHER): Payer: Commercial Managed Care - HMO

## 2015-07-20 ENCOUNTER — Ambulatory Visit (INDEPENDENT_AMBULATORY_CARE_PROVIDER_SITE_OTHER): Payer: Commercial Managed Care - HMO | Admitting: Internal Medicine

## 2015-07-20 DIAGNOSIS — E118 Type 2 diabetes mellitus with unspecified complications: Secondary | ICD-10-CM | POA: Diagnosis not present

## 2015-07-20 DIAGNOSIS — M170 Bilateral primary osteoarthritis of knee: Secondary | ICD-10-CM

## 2015-07-20 DIAGNOSIS — I10 Essential (primary) hypertension: Secondary | ICD-10-CM | POA: Diagnosis not present

## 2015-07-20 DIAGNOSIS — M160 Bilateral primary osteoarthritis of hip: Secondary | ICD-10-CM

## 2015-07-20 LAB — BASIC METABOLIC PANEL
BUN: 19 mg/dL (ref 6–23)
CO2: 30 mEq/L (ref 19–32)
Calcium: 9.7 mg/dL (ref 8.4–10.5)
Chloride: 105 mEq/L (ref 96–112)
Creatinine, Ser: 0.95 mg/dL (ref 0.40–1.50)
GFR: 84.64 mL/min (ref >60.00)
Glucose, Bld: 126 mg/dL — ABNORMAL HIGH (ref 70–99)
Potassium: 4.4 mEq/L (ref 3.5–5.1)
Sodium: 143 mEq/L (ref 135–145)

## 2015-07-20 LAB — HEMOGLOBIN A1C: Hgb A1c MFr Bld: 6.4 % (ref 4.6–6.5)

## 2015-07-20 MED ORDER — METFORMIN HCL 500 MG PO TABS: 500.0000 mg | ORAL_TABLET | Freq: Two times a day (BID) | ORAL | Status: DC

## 2015-07-20 MED ORDER — HYDROCODONE-ACETAMINOPHEN 10-325 MG PO TABS: 1.0000 | ORAL_TABLET | Freq: Three times a day (TID) | ORAL | Status: DC | PRN

## 2015-07-20 NOTE — Patient Instructions (Signed)
Type 2 Diabetes Mellitus, Adult °Type 2 diabetes mellitus, often simply referred to as type 2 diabetes, is a long-lasting (chronic) disease. In type 2 diabetes, the pancreas does not make enough insulin (a hormone), the cells are less responsive to the insulin that is made (insulin resistance), or both. Normally, insulin moves sugars from food into the tissue cells. The tissue cells use the sugars for energy. The lack of insulin or the lack of normal response to insulin causes excess sugars to build up in the blood instead of going into the tissue cells. As a result, high blood sugar (hyperglycemia) develops. The effect of high sugar (glucose) levels can cause many complications.  °Type 2 diabetes was also previously called adult-onset diabetes, but it can occur at any age.    °RISK FACTORS  °A person is predisposed to developing type 2 diabetes if someone in the family has the disease and also has one or more of the following primary risk factors: °· Weight gain, or being overweight or obese. °· An inactive lifestyle. °· A history of consistently eating high-calorie foods. °Maintaining a normal weight and regular physical activity can reduce the chance of developing type 2 diabetes. °SYMPTOMS  °A person with type 2 diabetes may not show symptoms initially. The symptoms of type 2 diabetes appear slowly. The symptoms include: °· Increased thirst (polydipsia). °· Increased urination (polyuria). °· Increased urination during the night (nocturia). °· Sudden or unexplained weight changes. °· Frequent, recurring infections. °· Tiredness (fatigue). °· Weakness. °· Vision changes, such as blurred vision. °· Fruity smell to your breath. °· Abdominal pain. °· Nausea or vomiting. °· Cuts or bruises which are slow to heal. °· Tingling or numbness in the hands or feet. °· An open skin wound (ulcer). °DIAGNOSIS °Type 2 diabetes is frequently not diagnosed until complications of diabetes are present. Type 2 diabetes is diagnosed  when symptoms or complications are present and when blood glucose levels are increased. Your blood glucose level may be checked by one or more of the following blood tests: °· A fasting blood glucose test. You will not be allowed to eat for at least 8 hours before a blood sample is taken. °· A random blood glucose test. Your blood glucose is checked at any time of the day regardless of when you ate. °· A hemoglobin A1c blood glucose test. A hemoglobin A1c test provides information about blood glucose control over the previous 3 months. °· An oral glucose tolerance test (OGTT). Your blood glucose is measured after you have not eaten (fasted) for 2 hours and then after you drink a glucose-containing beverage. °TREATMENT  °· You may need to take insulin or diabetes medicine daily to keep blood glucose levels in the desired range. °· If you use insulin, you may need to adjust the dosage depending on the carbohydrates that you eat with each meal or snack. °· Lifestyle changes are recommended as part of your treatment. These may include: °¨ Following an individualized diet plan developed by a nutritionist or dietitian. °¨ Exercising daily. °Your health care providers will set individualized treatment goals for you based on your age, your medicines, how long you have had diabetes, and any other medical conditions you have. Generally, the goal of treatment is to maintain the following blood glucose levels: °· Before meals (preprandial): 80-130 mg/dL. °· After meals (postprandial): below 180 mg/dL. °· A1c: less than 6.5-7%. °HOME CARE INSTRUCTIONS  °· Have your hemoglobin A1c level checked twice a year. °· Perform daily blood glucose monitoring   as directed by your health care provider. °· Monitor urine ketones when you are ill and as directed by your health care provider. °· Take your diabetes medicine or insulin as directed by your health care provider to maintain your blood glucose levels in the desired range. °¨ Never run  out of diabetes medicine or insulin. It is needed every day. °¨ If you are using insulin, you may need to adjust the amount of insulin given based on your intake of carbohydrates. Carbohydrates can raise blood glucose levels but need to be included in your diet. Carbohydrates provide vitamins, minerals, and fiber which are an essential part of a healthy diet. Carbohydrates are found in fruits, vegetables, whole grains, dairy products, legumes, and foods containing added sugars. °· Eat healthy foods. You should make an appointment to see a registered dietitian to help you create an eating plan that is right for you. °· Lose weight if you are overweight. °· Carry a medical alert card or wear your medical alert jewelry. °· Carry a 15-gram carbohydrate snack with you at all times to treat low blood glucose (hypoglycemia). Some examples of 15-gram carbohydrate snacks include: °¨ Glucose tablets, 3 or 4. °¨ Glucose gel, 15-gram tube. °¨ Raisins, 2 tablespoons (24 grams). °¨ Jelly beans, 6. °¨ Animal crackers, 8. °¨ Regular pop, 4 ounces (120 mL). °¨ Gummy treats, 9. °· Recognize hypoglycemia. Hypoglycemia occurs with blood glucose levels of 70 mg/dL and below. The risk for hypoglycemia increases when fasting or skipping meals, during or after intense exercise, and during sleep. Hypoglycemia symptoms can include: °¨ Tremors or shakes. °¨ Decreased ability to concentrate. °¨ Sweating. °¨ Increased heart rate. °¨ Headache. °¨ Dry mouth. °¨ Hunger. °¨ Irritability. °¨ Anxiety. °¨ Restless sleep. °¨ Altered speech or coordination. °¨ Confusion. °· Treat hypoglycemia promptly. If you are alert and able to safely swallow, follow the 15:15 rule: °¨ Take 15-20 grams of rapid-acting glucose or carbohydrate. Rapid-acting options include glucose gel, glucose tablets, or 4 ounces (120 mL) of fruit juice, regular soda, or low-fat milk. °¨ Check your blood glucose level 15 minutes after taking the glucose. °¨ Take 15-20 grams more of  glucose if the repeat blood glucose level is still 70 mg/dL or below. °¨ Eat a meal or snack within 1 hour once blood glucose levels return to normal. °· Be alert to feeling very thirsty and urinating more frequently than usual, which are early signs of hyperglycemia. An early awareness of hyperglycemia allows for prompt treatment. Treat hyperglycemia as directed by your health care provider. °· Engage in at least 150 minutes of moderate-intensity physical activity a week, spread over at least 3 days of the week or as directed by your health care provider. In addition, you should engage in resistance exercise at least 2 times a week or as directed by your health care provider. Try to spend no more than 90 minutes at one time inactive. °· Adjust your medicine and food intake as needed if you start a new exercise or sport. °· Follow your sick-day plan anytime you are unable to eat or drink as usual. °· Do not use any tobacco products including cigarettes, chewing tobacco, or electronic cigarettes. If you need help quitting, ask your health care provider. °· Limit alcohol intake to no more than 1 drink per day for nonpregnant women and 2 drinks per day for men. You should drink alcohol only when you are also eating food. Talk with your health care provider whether alcohol is safe   for you. Tell your health care provider if you drink alcohol several times a week. °· Keep all follow-up visits as directed by your health care provider. This is important. °· Schedule an eye exam soon after the diagnosis of type 2 diabetes and then annually. °· Perform daily skin and foot care. Examine your skin and feet daily for cuts, bruises, redness, nail problems, bleeding, blisters, or sores. A foot exam by a health care provider should be done annually. °· Brush your teeth and gums at least twice a day and floss at least once a day. Follow up with your dentist regularly. °· Share your diabetes management plan with your workplace or  school. °· Keep your immunizations up to date. It is recommended that you receive a flu (influenza) vaccine every year. It is also recommended that you receive a pneumonia (pneumococcal) vaccine. If you are 65 years of age or older and have never received a pneumonia vaccine, this vaccine may be given as a series of two separate shots. Ask your health care provider which additional vaccines may be recommended. °· Learn to manage stress. °· Obtain ongoing diabetes education and support as needed. °· Participate in or seek rehabilitation as needed to maintain or improve independence and quality of life. Request a physical or occupational therapy referral if you are having foot or hand numbness, or difficulties with grooming, dressing, eating, or physical activity. °SEEK MEDICAL CARE IF:  °· You are unable to eat food or drink fluids for more than 6 hours. °· You have nausea and vomiting for more than 6 hours. °· Your blood glucose level is over 240 mg/dL. °· There is a change in mental status. °· You develop an additional serious illness. °· You have diarrhea for more than 6 hours. °· You have been sick or have had a fever for a couple of days and are not getting better. °· You have pain during any physical activity.   °SEEK IMMEDIATE MEDICAL CARE IF: °· You have difficulty breathing. °· You have moderate to large ketone levels. °  °This information is not intended to replace advice given to you by your health care provider. Make sure you discuss any questions you have with your health care provider. °  °Document Released: 06/24/2005 Document Revised: 03/15/2015 Document Reviewed: 01/21/2012 °Elsevier Interactive Patient Education ©2016 [employer] ° °

## 2015-07-20 NOTE — Progress Notes (Signed)
Pre visit review using our clinic review tool, if applicable. No additional management support is needed unless otherwise documented below in the visit note. 

## 2015-07-20 NOTE — Progress Notes (Signed)
Subjective:  Patient ID: Cody Hale, male    DOB: 1950/12/21  Age: 65 y.o. MRN: [id number]  CC: Diabetes   HPI Dalen D Askren presents for up on diabetes. He is also concerned about some spots on his buttocks that is been there for several months. He says there is some soreness when he sits down for long periods of time. This is more noticeable on the right side than the left side.  Outpatient Prescriptions Prior to Visit  Medication Sig Dispense Refill  . Blood Glucose Monitoring Suppl (ONETOUCH VERIO IQ SYSTEM) W/DEVICE KIT 1 Act by Does not apply route 3 (three) times daily. 2 kit 0  . carvedilol (COREG) 6.25 MG tablet TAKE 1 TABLET BY MOUTH TWICE DAILY. 60 tablet 11  . clonazePAM (KLONOPIN) 1 MG tablet Take 1 tablet (1 mg total) by mouth 2 (two) times daily as needed for anxiety. 60 tablet 5  . furosemide (LASIX) 20 MG tablet Take 1 tablet (20 mg total) by mouth daily. 90 tablet 3  . glucose blood test strip Use TID 100 each 12  . Lancets (ONETOUCH ULTRASOFT) lancets Use to help check blood sugars twice a day E11.8 100 each 3  . lisinopril (PRINIVIL,ZESTRIL) 2.5 MG tablet TAKE 1 TABLET BY MOUTH ONCE DAILY. 30 tablet 11  . Multiple Vitamin (DAILY VITE) TABS TAKE 1 TABLET BY MOUTH ONCE DAILY. 30 tablet 11  . nitroGLYCERIN (NITROSTAT) 0.4 MG SL tablet Place 1 tablet (0.4 mg total) under the tongue every 5 (five) minutes x 3 doses as needed for chest pain. 25 tablet 3  . potassium chloride SA (K-DUR,KLOR-CON) 10 MEQ tablet Take 1 tablet (10 mEq total) by mouth daily. 90 tablet 3  . simvastatin (ZOCOR) 20 MG tablet TAKE 1 TABLET BY MOUTH ONCE DAILY. 30 tablet 11  . Vilazodone HCl (VIIBRYD) 40 MG TABS Take 1 tablet (40 mg total) by mouth daily. 30 tablet 11  . XARELTO 20 MG TABS tablet TAKE 1 TABLET BY MOUTH ONCE DAILY. 30 tablet 5  . HYDROcodone-acetaminophen (NORCO) 10-325 MG tablet Take 1 tablet by mouth every 8 (eight) hours as needed. 75 tablet 0  . JANUMET 50-1000 MG per tablet  TAKE 1 TABLET BY MOUTH TWICE DAILY WITH MEALS. 60 tablet 11   No facility-administered medications prior to visit.    ROS Review of Systems  Constitutional: Negative.  Negative for fever, chills, diaphoresis, appetite change and fatigue.  HENT: Negative.   Eyes: Negative.   Respiratory: Negative.  Negative for cough, choking, chest tightness, shortness of breath and stridor.   Cardiovascular: Negative.  Negative for chest pain, palpitations and leg swelling.  Gastrointestinal: Negative.  Negative for nausea, vomiting, abdominal pain, diarrhea, constipation and blood in stool.  Endocrine: Negative.  Negative for polydipsia, polyphagia and polyuria.  Genitourinary: Negative.  Negative for dysuria, frequency, hematuria, flank pain and difficulty urinating.  Musculoskeletal: Positive for arthralgias. Negative for myalgias, back pain, joint swelling and neck pain.  Skin: Positive for wound. Negative for color change, pallor and rash.  Allergic/Immunologic: Negative.   Neurological: Negative.  Negative for dizziness, tremors, syncope, light-headedness and numbness.  Hematological: Negative.  Negative for adenopathy. Does not bruise/bleed easily.  Psychiatric/Behavioral: Negative.     Objective:  BP 126/70 mmHg  Pulse 85  Temp(Src) 98.4 F (36.9 C) (Oral)  Resp 16  Ht 5' 10"  (1.778 m)  Wt 247 lb (112.038 kg)  BMI 35.44 kg/m2  SpO2 93%  BP Readings from Last 3 Encounters:  07/20/15  126/70  04/25/15 138/80  03/22/15 126/90    Wt Readings from Last 3 Encounters:  07/20/15 247 lb (112.038 kg)  04/25/15 246 lb (111.585 kg)  03/22/15 253 lb (114.76 kg)    Physical Exam  Constitutional: He is oriented to person, place, and time. He appears well-developed and well-nourished. No distress.  HENT:  Mouth/Throat: Oropharynx is clear and moist. No oropharyngeal exudate.  Eyes: Conjunctivae are normal. Right eye exhibits no discharge. Left eye exhibits no discharge. No scleral icterus.    Neck: Normal range of motion. Neck supple. No JVD present. No tracheal deviation present. No thyromegaly present.  Cardiovascular: Normal rate, regular rhythm, normal heart sounds and intact distal pulses.  Exam reveals no gallop and no friction rub.   No murmur heard. Pulmonary/Chest: Effort normal and breath sounds normal. No stridor. No respiratory distress. He has no wheezes. He has no rales. He exhibits no tenderness.  Abdominal: Soft. Bowel sounds are normal. He exhibits no distension and no mass. There is no tenderness. There is no rebound and no guarding.  Musculoskeletal: Normal range of motion. He exhibits no edema or tenderness.  Lymphadenopathy:    He has no cervical adenopathy.  Neurological: He is oriented to person, place, and time.  Skin: Skin is warm and dry. Abrasion and rash noted. Rash is not papular, not nodular, not vesicular and not urticarial. He is not diaphoretic. There is erythema. No pallor.     Vitals reviewed.   Lab Results  Component Value Date   WBC 5.4 06/13/2015   HGB 15.6 06/13/2015   HCT 48.3 06/13/2015   PLT 118* 06/13/2015   GLUCOSE 126* 07/20/2015   CHOL 94 04/25/2015   TRIG 74.0 04/25/2015   HDL 45.30 04/25/2015   LDLDIRECT 84.2 01/31/2014   LDLCALC 34 04/25/2015   ALT 21 04/25/2015   AST 21 04/25/2015   NA 143 07/20/2015   K 4.4 07/20/2015   CL 105 07/20/2015   CREATININE 0.95 07/20/2015   BUN 19 07/20/2015   CO2 30 07/20/2015   TSH 0.43 04/25/2015   PSA 0.36 05/30/2014   INR 1.13 11/30/2012   HGBA1C 6.4 07/20/2015   MICROALBUR 0.7 11/22/2014    No results found.  Assessment & Plan:   Geroge was seen today for diabetes.  Diagnoses and all orders for this visit:  Hypocalcemia  Type 2 diabetes mellitus with complication, without long-term current use of insulin ([place])- his A1c is 6.4%, will discontinue the Invokana and Januvia, I think his blood sugar will be adequately controlled with metformin as his single agent. -      Basic metabolic panel; Future -     Hemoglobin A1c; Future -     metFORMIN (GLUCOPHAGE) 500 MG tablet; Take 1 tablet (500 mg total) by mouth 2 (two) times daily with a meal.  Primary osteoarthritis of both knees -     Discontinue: HYDROcodone-acetaminophen (NORCO) 10-325 MG tablet; Take 1 tablet by mouth every 8 (eight) hours as needed. -     Discontinue: HYDROcodone-acetaminophen (NORCO) 10-325 MG tablet; Take 1 tablet by mouth every 8 (eight) hours as needed. -     HYDROcodone-acetaminophen (NORCO) 10-325 MG tablet; Take 1 tablet by mouth every 8 (eight) hours as needed.  Primary osteoarthritis of both hips -     Discontinue: HYDROcodone-acetaminophen (NORCO) 10-325 MG tablet; Take 1 tablet by mouth every 8 (eight) hours as needed. -     Discontinue: HYDROcodone-acetaminophen (NORCO) 10-325 MG tablet; Take 1 tablet by mouth every  8 (eight) hours as needed. -     HYDROcodone-acetaminophen (NORCO) 10-325 MG tablet; Take 1 tablet by mouth every 8 (eight) hours as needed.  Essential hypertension, malignant- his blood pressure is well-controlled, lites and renal function are stable. -     Basic metabolic panel; Future  I have discontinued Mr. Najeh Credit. I am also having him start on metFORMIN. Additionally, I am having him maintain his nitroGLYCERIN, glucose blood, ONETOUCH VERIO IQ SYSTEM, onetouch ultrasoft, furosemide, potassium chloride, Vilazodone HCl, DAILY VITE, XARELTO, carvedilol, simvastatin, clonazePAM, lisinopril, and HYDROcodone-acetaminophen.  Meds ordered this encounter  Medications  . DISCONTD: HYDROcodone-acetaminophen (NORCO) 10-325 MG tablet    Sig: Take 1 tablet by mouth every 8 (eight) hours as needed.    Dispense:  75 tablet    Refill:  0  . DISCONTD: HYDROcodone-acetaminophen (NORCO) 10-325 MG tablet    Sig: Take 1 tablet by mouth every 8 (eight) hours as needed.    Dispense:  75 tablet    Refill:  0  . HYDROcodone-acetaminophen (NORCO) 10-325 MG tablet    Sig:  Take 1 tablet by mouth every 8 (eight) hours as needed.    Dispense:  75 tablet    Refill:  0  . metFORMIN (GLUCOPHAGE) 500 MG tablet    Sig: Take 1 tablet (500 mg total) by mouth 2 (two) times daily with a meal.    Dispense:  180 tablet    Refill:  1     Follow-up: Return in about 6 months (around 01/17/2016).  Scarlette Calico, MD

## 2015-07-25 DIAGNOSIS — B351 Tinea unguium: Secondary | ICD-10-CM | POA: Diagnosis not present

## 2015-07-25 DIAGNOSIS — M79675 Pain in left toe(s): Secondary | ICD-10-CM | POA: Diagnosis not present

## 2015-07-25 DIAGNOSIS — M79672 Pain in left foot: Secondary | ICD-10-CM | POA: Diagnosis not present

## 2015-07-27 ENCOUNTER — Telehealth: Payer: Self-pay | Admitting: Internal Medicine

## 2015-07-27 MED ORDER — PROMETHAZINE-DM 6.25-15 MG/5ML PO SYRP: 5.0000 mL | ORAL_SOLUTION | Freq: Four times a day (QID) | ORAL | Status: DC | PRN

## 2015-07-27 NOTE — Telephone Encounter (Signed)
please advise.

## 2015-07-27 NOTE — Telephone Encounter (Signed)
Patient stating he is congestive and has a headache.  Wants to know if something can be sent to [facility] in [place].

## 2015-07-27 NOTE — Telephone Encounter (Signed)
Rx sent 

## 2015-08-18 ENCOUNTER — Ambulatory Visit (INDEPENDENT_AMBULATORY_CARE_PROVIDER_SITE_OTHER): Payer: Commercial Managed Care - HMO | Admitting: Nurse Practitioner

## 2015-08-18 ENCOUNTER — Encounter: Payer: Self-pay | Admitting: Internal Medicine

## 2015-08-18 ENCOUNTER — Telehealth: Payer: Self-pay | Admitting: Internal Medicine

## 2015-08-18 VITALS — BP 134/60 | HR 69 | Temp 97.9°F | Resp 18 | Wt 270.0 lb

## 2015-08-18 DIAGNOSIS — I5022 Chronic systolic (congestive) heart failure: Secondary | ICD-10-CM | POA: Diagnosis not present

## 2015-08-18 DIAGNOSIS — L03119 Cellulitis of unspecified part of limb: Secondary | ICD-10-CM

## 2015-08-18 MED ORDER — DOXYCYCLINE HYCLATE 100 MG PO TABS: 100.0000 mg | ORAL_TABLET | Freq: Two times a day (BID) | ORAL | Status: DC

## 2015-08-18 NOTE — Patient Instructions (Signed)
Doxycyline was sent into your pharmacy to help keep your legs from getting full blown cellulitis.   Keep an eye on your weights- daily weights recommended.  Greater than or = to 3 lbs difference between any consecutive days- call primary care or heart doctor.

## 2015-08-18 NOTE — Telephone Encounter (Signed)
error 

## 2015-08-18 NOTE — Progress Notes (Addendum)
Patient ID: Cody Hale, male    DOB: [birth year]  Age: 65 y.o. MRN: [id number]  CC: Leg Swelling   HPI Cody Hale presents for CC of leg swelling on right and pain on left leg.  1) Right leg swelling, redness started last night  2) Left leg pain started last night   Vicodin for pain  20 mg of lasix daily  Unsure about weight checked here-  up 30 pounds from January? 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   He has past surgical history that includes Mandible fracture surgery (1970); TEE without cardioversion (07/09/2012); Multiple extractions with alveoloplasty (07/10/2012); Mitral valve replacement (03.03.14); transthoracic echocardiogram (11/2012); left heart catheterization with coronary angiogram (N/A, 08/28/2012); and right heart catheterization (08/28/2012).   His family history includes Alcohol abuse in his father; Arthritis in his father; Diabetes in his father; Heart disease in his father; Hypertension in his brother, sister, sister, and sister; Lung cancer in his father; Ovarian cancer in his mother. There is no history of Early death, Hyperlipidemia, Kidney disease, or Stroke.He reports that he quit smoking about 23  years ago. His smoking use included Cigarettes. He has a 20 pack-year smoking history. He has never used smokeless tobacco. He reports that he does not drink alcohol or use illicit drugs.  Outpatient Prescriptions Prior to Visit  Medication Sig Dispense Refill  . Blood Glucose Monitoring Suppl (ONETOUCH VERIO IQ SYSTEM) W/DEVICE KIT 1 Act by Does not apply route 3 (three) times daily. 2 kit 0  . carvedilol (COREG) 6.25 MG tablet TAKE 1 TABLET BY MOUTH TWICE DAILY. 60 tablet 11  . clonazePAM (KLONOPIN) 1 MG tablet Take 1 tablet (1 mg total) by mouth 2 (two) times daily as needed for anxiety. 60 tablet 5  . furosemide (LASIX) 20 MG tablet Take 1 tablet (20 mg total) by mouth daily. 90 tablet 3  . glucose blood test strip Use TID 100 each 12  . HYDROcodone-acetaminophen (NORCO) 10-325 MG tablet Take 1 tablet by mouth every 8 (eight) hours as needed. 75 tablet 0  . Lancets (ONETOUCH ULTRASOFT) lancets Use to help check blood sugars twice a day E11.8 100 each 3  . lisinopril (PRINIVIL,ZESTRIL) 2.5 MG tablet TAKE 1 TABLET BY MOUTH ONCE DAILY. 30 tablet 11  . metFORMIN (GLUCOPHAGE) 500 MG tablet Take 1 tablet (500 mg total) by mouth 2 (two) times daily with a meal. 180 tablet 1  . Multiple Vitamin (DAILY VITE) TABS TAKE 1 TABLET BY MOUTH ONCE DAILY. 30 tablet 11  . nitroGLYCERIN (NITROSTAT) 0.4 MG SL tablet Place 1 tablet (0.4 mg total) under the tongue every 5 (five) minutes x 3 doses as needed for chest pain. 25 tablet 3  . potassium chloride SA (K-DUR,KLOR-CON) 10 MEQ tablet Take 1 tablet (10 mEq total) by mouth daily. 90 tablet 3  . promethazine-dextromethorphan (PROMETHAZINE-DM) 6.25-15 MG/5ML syrup Take 5 mLs by mouth 4 (four) times daily as  needed for cough. 118 mL 0  . simvastatin (ZOCOR) 20 MG tablet TAKE 1 TABLET BY MOUTH ONCE DAILY. 30 tablet 11  . Vilazodone HCl (VIIBRYD) 40 MG TABS Take 1 tablet (40 mg total) by mouth daily. 30 tablet 11  . XARELTO 20 MG TABS tablet TAKE 1 TABLET BY MOUTH  ONCE DAILY. 30 tablet 5   No facility-administered medications prior to visit.    ROS Review of Systems  Constitutional: Negative for fever, chills, diaphoresis and fatigue.  Respiratory: Negative for cough, chest tightness and wheezing.   Cardiovascular: Positive for leg swelling. Negative for chest pain and palpitations.  Musculoskeletal: Positive for joint swelling and arthralgias.  Skin: Positive for color change. Negative for rash and wound.    Objective:  BP 134/60 mmHg  Pulse 69  Temp(Src) 97.9 F (36.6 C) (Oral)  Resp 18  Wt 270 lb (122.471 kg)  SpO2 97%  Physical Exam  Constitutional: He is oriented to person, place, and time. He appears well-developed and well-nourished. No distress.  HENT:  Head: Normocephalic and atraumatic.  Right Ear: External ear normal.  Left Ear: External ear normal.  Cardiovascular: Normal rate and regular rhythm.  Exam reveals no friction rub.   Pulmonary/Chest: Effort normal and breath sounds normal. No respiratory distress. He has no wheezes. He has no rales. He exhibits no tenderness.  Musculoskeletal: He exhibits edema and tenderness.  Left leg appears larger than right, both tight shiny, no erythema visible, but sore on left knee healing 1 cm or less in diameter  1+ pitting bilaterally   Neurological: He is alert and oriented to person, place, and time.  Skin: Skin is warm and dry. No rash noted. He is not diaphoretic.  Psychiatric: He has a normal mood and affect. His behavior is normal. Judgment and thought content normal.   Assessment & Plan:   Cody Hale was seen today for leg swelling.  Diagnoses and all orders for this visit:  Chronic systolic heart failure (HCC)  Cellulitis of lower extremity, unspecified laterality  Other orders -     doxycycline (VIBRA-TABS) 100 MG tablet; Take 1 tablet (100 mg total) by mouth 2 (two) times daily.  I am having Cody Hale start on doxycycline. I am also having him maintain his  nitroGLYCERIN, glucose blood, ONETOUCH VERIO IQ SYSTEM, onetouch ultrasoft, furosemide, potassium chloride, Vilazodone HCl, DAILY VITE, XARELTO, carvedilol, simvastatin, clonazePAM, lisinopril, HYDROcodone-acetaminophen, metFORMIN, and promethazine-dextromethorphan.  Meds ordered this encounter  Medications  . doxycycline (VIBRA-TABS) 100 MG tablet    Sig: Take 1 tablet (100 mg total) by mouth 2 (two) times daily.    Dispense:  14 tablet    Refill:  0    Order Specific Question:  Supervising Provider    Answer:  Crecencio Mc [[id number]]     Follow-up: Return if symptoms worsen or fail to improve.

## 2015-08-18 NOTE — Progress Notes (Signed)
Pre visit review using our clinic review tool, if applicable. No additional management support is needed unless otherwise documented below in the visit note. 

## 2015-08-20 ENCOUNTER — Encounter: Payer: Self-pay | Admitting: Nurse Practitioner

## 2015-08-20 DIAGNOSIS — L0390 Cellulitis, unspecified: Secondary | ICD-10-CM | POA: Insufficient documentation

## 2015-08-20 NOTE — Assessment & Plan Note (Signed)
Ordered daily weights at assisted living facility Asked them to contact either cardiologist are PCP regarding 3 pound weight gain or greater between consecutive days.  Unsure if the weight at last visit was input incorrectly it could've been 274, but patient is unaware of any significant weight gain.

## 2015-08-20 NOTE — Assessment & Plan Note (Signed)
New problem to me Patient has history of left leg cellulitis He is coming in with early redness of right leg with pain and pain of left leg. There is a scab on the left knee and could be portal of entry for infection. Treating with doxycycline today advise very close follow-up with cardiologist and PCP if any symptoms change.

## 2015-08-24 ENCOUNTER — Ambulatory Visit: Payer: Self-pay | Admitting: Internal Medicine

## 2015-08-26 IMAGING — US US EXTREM LOW VENOUS*R*
1 series · 13 of 24 positions shown · non-contrast
Comparison: MRI 01/21/2015.

CLINICAL DATA: Swelling.



[Series 1: us extrem low venous*right* · 0.11mm/px · 13 of 146 slices shown]
[im 1/146]
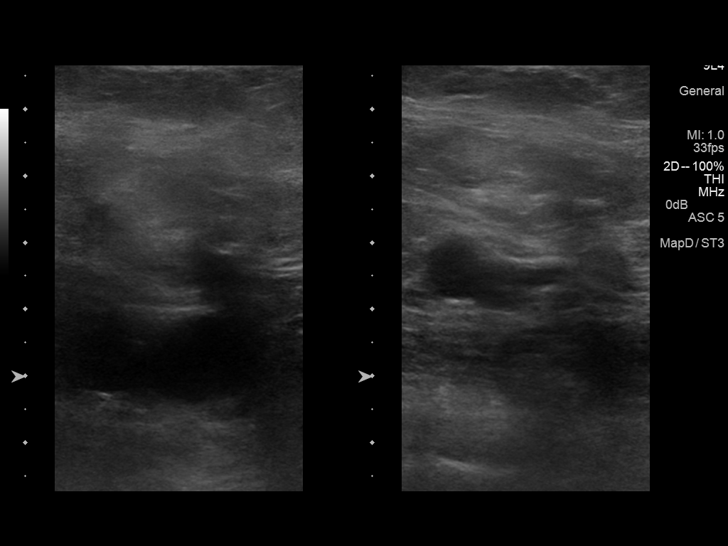
[im 13/146]
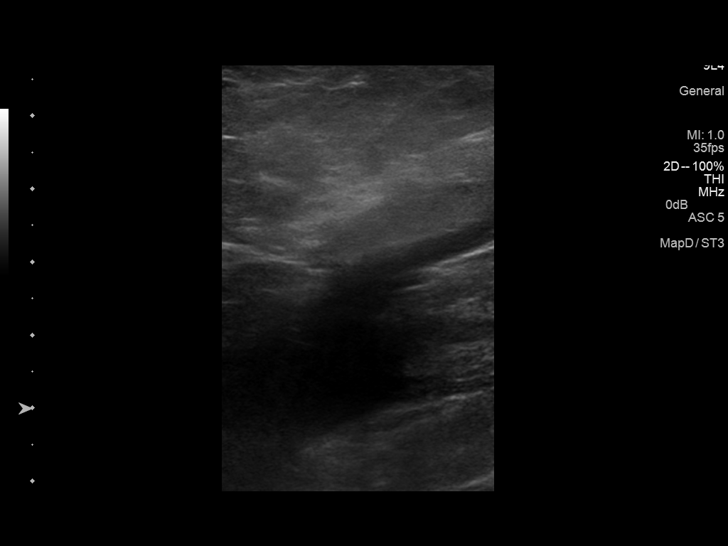
[im 26/146]
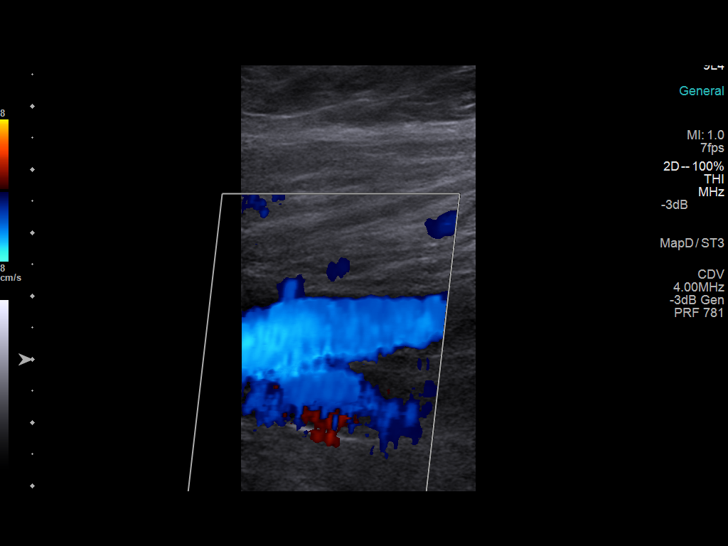
[im 38/146]
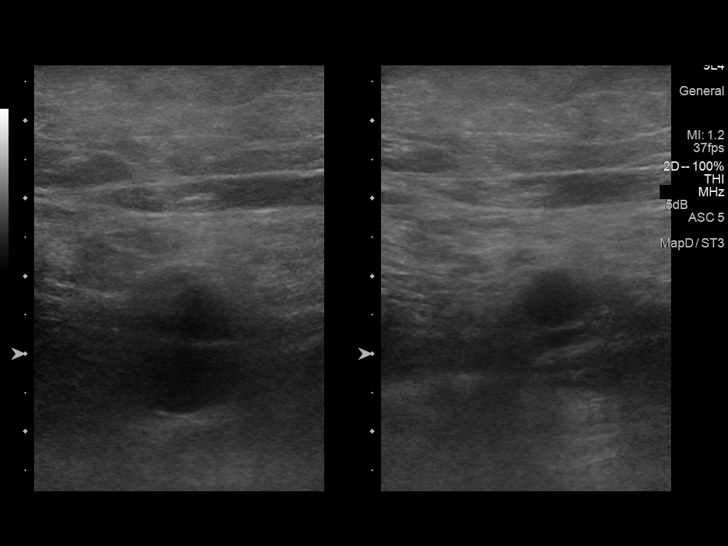
[im 51/146]
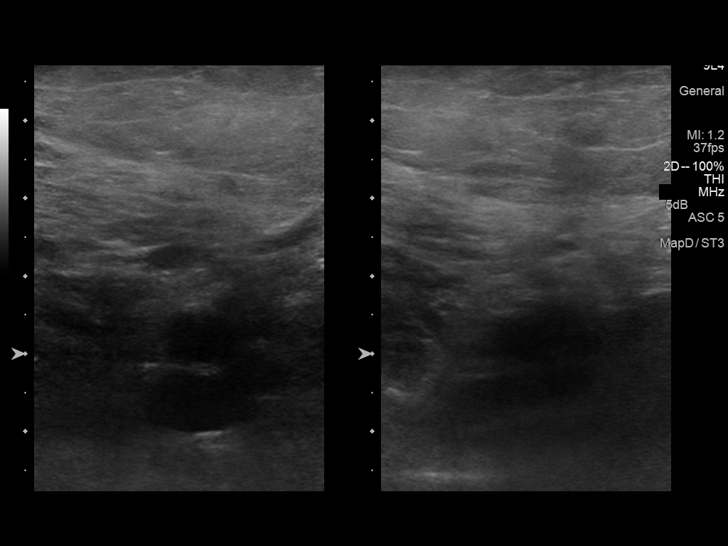
[im 64/146]
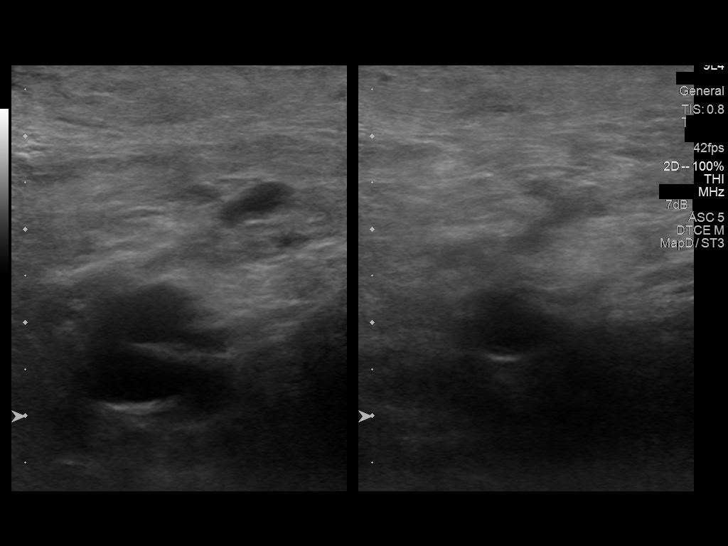
[im 76/146]
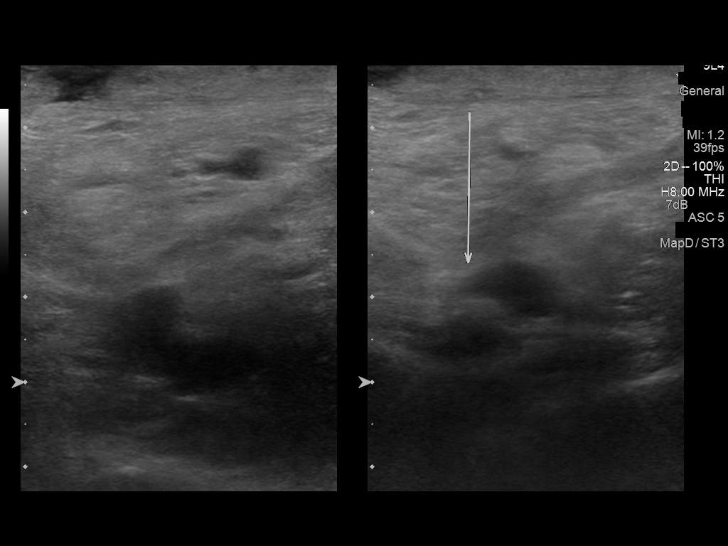
[im 82/146]
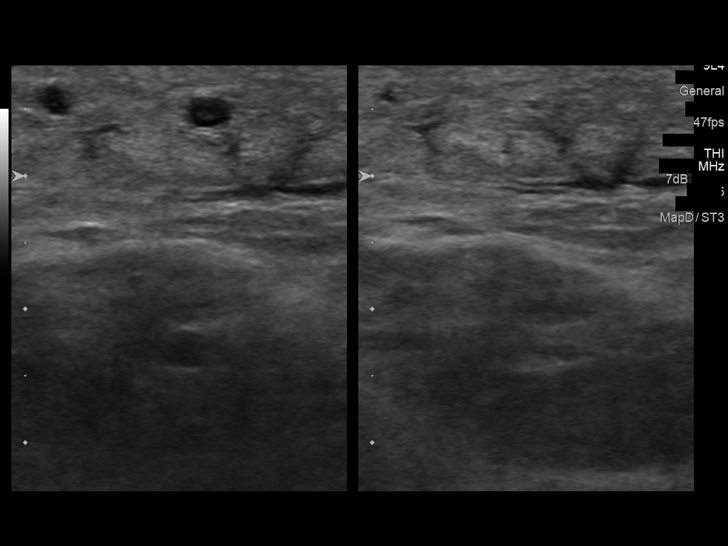
[im 95/146]
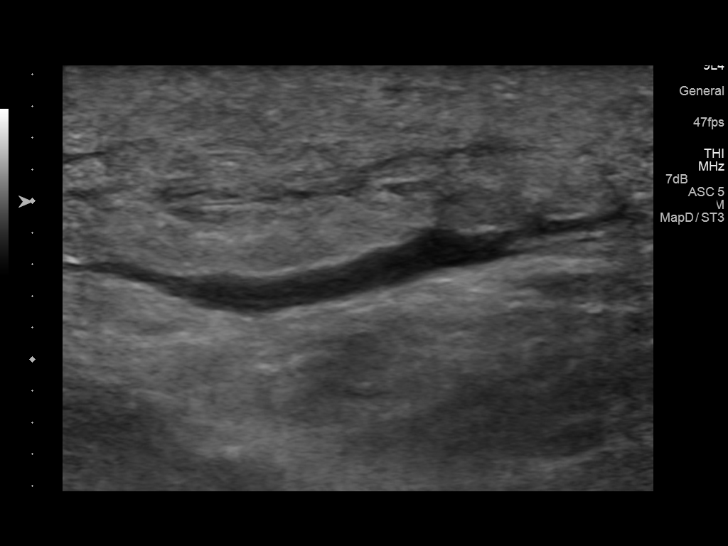
[im 108/146]
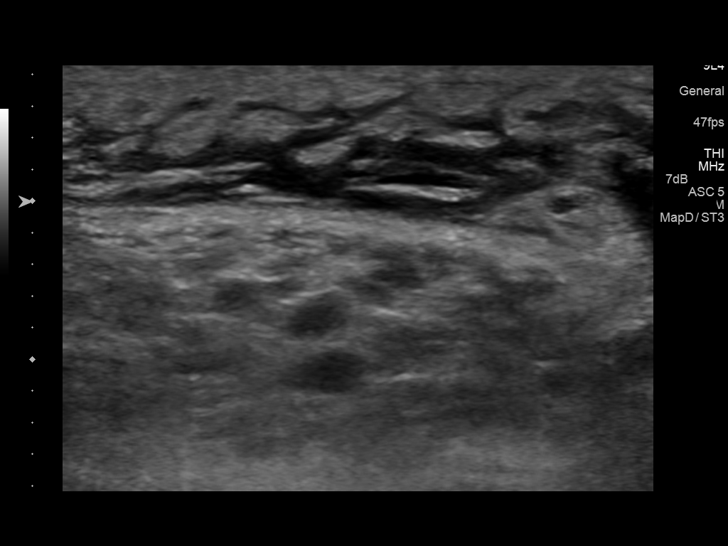
[im 120/146]
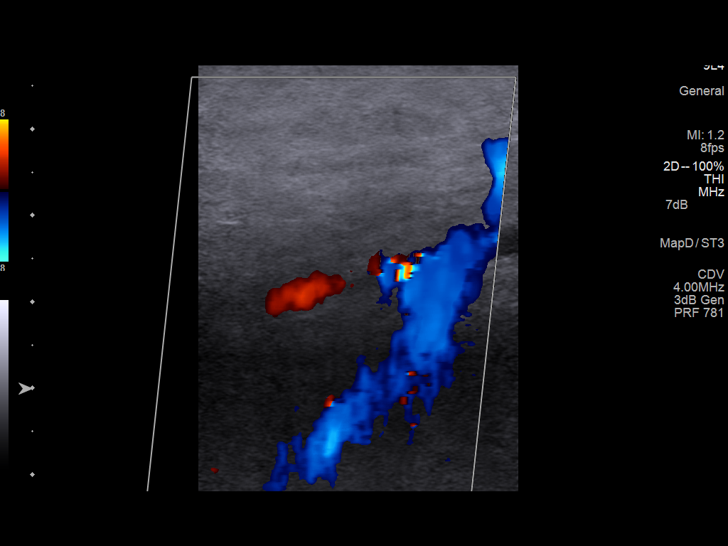
[im 133/146]
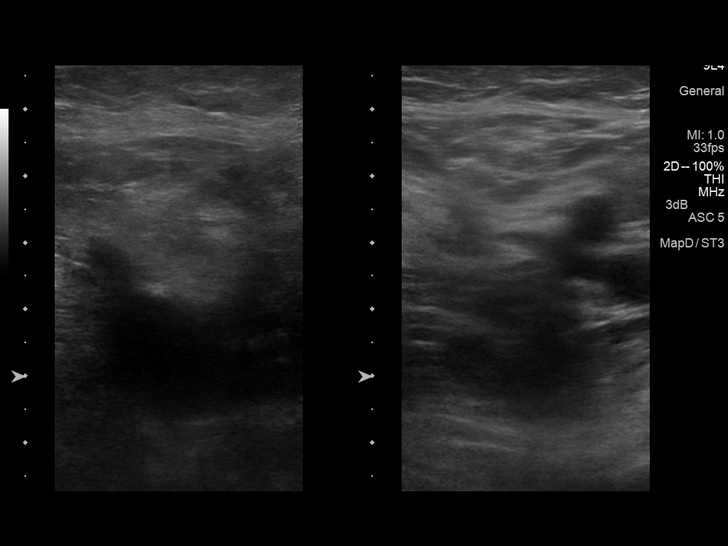
[im 146/146]
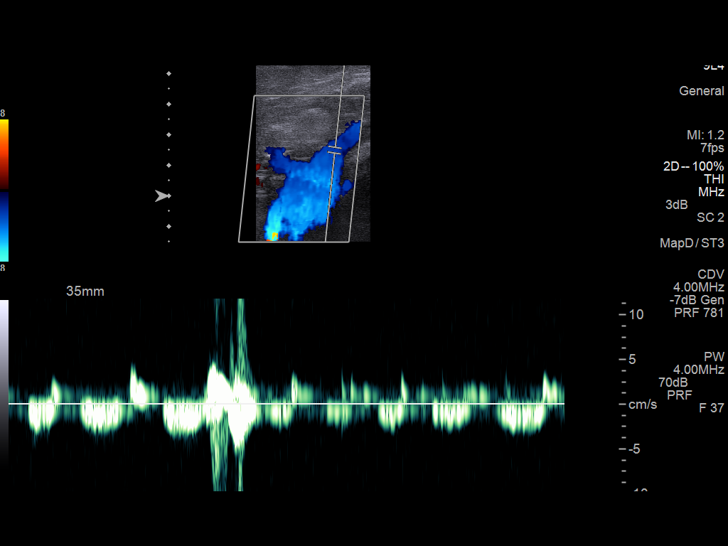

[13 of 24 positions shown; findings below may reference images not displayed]

FINDINGS: Contralateral Common Femoral Vein: Respiratory phasicity is normal
and symmetric with the symptomatic side. No evidence of thrombus.
Normal compressibility.

Common Femoral Vein: No evidence of thrombus. Normal
compressibility, respiratory phasicity and response to augmentation.

Saphenofemoral Junction: No evidence of thrombus. Normal
compressibility and flow on color Doppler imaging.

Profunda Femoral Vein: No evidence of thrombus. Normal
compressibility and flow on color Doppler imaging.

Femoral Vein: No evidence of thrombus. Normal compressibility,
respiratory phasicity and response to augmentation.

Popliteal Vein: No evidence of thrombus. Normal compressibility,
respiratory phasicity and response to augmentation.

Calf Veins: No evidence of thrombus. Normal compressibility and flow
on color Doppler imaging.

Superficial Great Saphenous Vein: No evidence of thrombus. Normal
compressibility and flow on color Doppler imaging.

Venous Reflux:  None.

Other Findings:  Soft tissue edema noted distally.
IMPRESSION: No evidence of deep venous thrombosis.

## 2015-09-04 ENCOUNTER — Telehealth: Payer: Self-pay | Admitting: Internal Medicine

## 2015-09-04 MED ORDER — PROMETHAZINE-DM 6.25-15 MG/5ML PO SYRP: 5.0000 mL | ORAL_SOLUTION | Freq: Four times a day (QID) | ORAL | Status: DC | PRN

## 2015-09-04 MED ORDER — DEXTROMETHORPHAN-GUAIFENESIN 10-100 MG/5ML PO SYRP: 5.0000 mL | ORAL_SOLUTION | Freq: Two times a day (BID) | ORAL | Status: DC

## 2015-09-04 MED ORDER — AZITHROMYCIN 500 MG PO TABS: 500.0000 mg | ORAL_TABLET | Freq: Every day | ORAL | Status: DC

## 2015-09-04 NOTE — Telephone Encounter (Signed)
Please advise 

## 2015-09-04 NOTE — Telephone Encounter (Signed)
States that patient needs to be seen or needs something called in to [facility] for congestion and coughing up yellow mucus.  States that they can not take patient to Urgent Care because Urgent Care does not take patients insurance.  I tried to get in at [facility] but only openings are after 3pm and patient does not have transportation after that time.  Facility does not have transportation to take patient to another office.  Is requesting Dr. Yetta Barre to work in before 3pm or to send a script in for patient.

## 2015-09-04 NOTE — Telephone Encounter (Signed)
ok 

## 2015-09-04 NOTE — Telephone Encounter (Signed)
Pharmacy called stating that cough syrup is nationally back ordered. Pharmacy is requesting to switch to Robitussin DM, please advise

## 2015-09-04 NOTE — Telephone Encounter (Signed)
Pharmacist Ayesha Rumpf advised

## 2015-09-04 NOTE — Telephone Encounter (Signed)
2 rx's were sent

## 2015-09-21 ENCOUNTER — Other Ambulatory Visit: Payer: Self-pay | Admitting: Internal Medicine

## 2015-10-12 ENCOUNTER — Telehealth: Payer: Self-pay | Admitting: Internal Medicine

## 2015-10-12 ENCOUNTER — Ambulatory Visit (INDEPENDENT_AMBULATORY_CARE_PROVIDER_SITE_OTHER): Payer: Commercial Managed Care - HMO | Admitting: Internal Medicine

## 2015-10-12 ENCOUNTER — Encounter: Payer: Self-pay | Admitting: Internal Medicine

## 2015-10-12 ENCOUNTER — Other Ambulatory Visit (INDEPENDENT_AMBULATORY_CARE_PROVIDER_SITE_OTHER): Payer: Commercial Managed Care - HMO

## 2015-10-12 VITALS — BP 128/80 | HR 67 | Temp 98.8°F | Resp 16 | Ht 70.0 in | Wt 277.0 lb

## 2015-10-12 DIAGNOSIS — R600 Localized edema: Secondary | ICD-10-CM | POA: Diagnosis not present

## 2015-10-12 DIAGNOSIS — M160 Bilateral primary osteoarthritis of hip: Secondary | ICD-10-CM | POA: Diagnosis not present

## 2015-10-12 DIAGNOSIS — I10 Essential (primary) hypertension: Secondary | ICD-10-CM

## 2015-10-12 DIAGNOSIS — I5022 Chronic systolic (congestive) heart failure: Secondary | ICD-10-CM | POA: Diagnosis not present

## 2015-10-12 LAB — COMPREHENSIVE METABOLIC PANEL
ALT: 22 U/L (ref 0–53)
AST: 19 U/L (ref 0–37)
Albumin: 4.2 g/dL (ref 3.5–5.2)
Alkaline Phosphatase: 72 U/L (ref 39–117)
BUN: 15 mg/dL (ref 6–23)
CO2: 31 mEq/L (ref 19–32)
Calcium: 9.3 mg/dL (ref 8.4–10.5)
Chloride: 103 mEq/L (ref 96–112)
Creatinine, Ser: 0.8 mg/dL (ref 0.40–1.50)
GFR: 103.13 mL/min (ref >60.00)
Glucose, Bld: 143 mg/dL — ABNORMAL HIGH (ref 70–99)
Potassium: 4.8 mEq/L (ref 3.5–5.1)
Sodium: 140 mEq/L (ref 135–145)
Total Bilirubin: 0.7 mg/dL (ref 0.2–1.2)
Total Protein: 7.2 g/dL (ref 6.0–8.3)

## 2015-10-12 LAB — URINALYSIS, ROUTINE W REFLEX MICROSCOPIC
Bilirubin Urine: NEGATIVE
Hgb urine dipstick: NEGATIVE
Ketones, ur: NEGATIVE
Nitrite: NEGATIVE
RBC / HPF: NONE SEEN (ref 0–?)
Specific Gravity, Urine: 1.015 (ref 1.000–1.030)
Total Protein, Urine: NEGATIVE
Urine Glucose: NEGATIVE
Urobilinogen, UA: 0.2 (ref 0.0–1.0)
pH: 6 (ref 5.0–8.0)

## 2015-10-12 LAB — CBC WITH DIFFERENTIAL/PLATELET
Basophils Absolute: 0 10*3/uL (ref 0.0–0.1)
Basophils Relative: 1.2 % (ref 0.0–3.0)
Eosinophils Absolute: 0.1 10*3/uL (ref 0.0–0.7)
Eosinophils Relative: 3.1 % (ref 0.0–5.0)
HCT: 41.4 % (ref 39.0–52.0)
Hemoglobin: 13.7 g/dL (ref 13.0–17.0)
Lymphocytes Relative: 24.6 % (ref 12.0–46.0)
Lymphs Abs: 0.9 10*3/uL (ref 0.7–4.0)
MCHC: 33.2 g/dL (ref 30.0–36.0)
MCV: 93.8 fl (ref 78.0–100.0)
Monocytes Absolute: 0.6 10*3/uL (ref 0.1–1.0)
Monocytes Relative: 16.5 % — ABNORMAL HIGH (ref 3.0–12.0)
Neutro Abs: 1.9 10*3/uL (ref 1.4–7.7)
Neutrophils Relative %: 54.6 % (ref 43.0–77.0)
Platelets: 103 10*3/uL — ABNORMAL LOW (ref 150.0–400.0)
RBC: 4.41 Mil/uL (ref 4.22–5.81)
RDW: 14.8 % (ref 11.5–15.5)
WBC: 3.5 10*3/uL — ABNORMAL LOW (ref 4.0–10.5)

## 2015-10-12 LAB — TSH: TSH: 0.9 u[IU]/mL (ref 0.35–4.50)

## 2015-10-12 LAB — BRAIN NATRIURETIC PEPTIDE: Pro B Natriuretic peptide (BNP): 157 pg/mL — ABNORMAL HIGH (ref 0.0–100.0)

## 2015-10-12 LAB — D-DIMER, QUANTITATIVE: D-Dimer, Quant: 0.27 ug/mL-FEU (ref 0.00–0.48)

## 2015-10-12 MED ORDER — VALSARTAN 40 MG PO TABS: 40.0000 mg | ORAL_TABLET | Freq: Two times a day (BID) | ORAL | Status: DC

## 2015-10-12 MED ORDER — TORSEMIDE 20 MG PO TABS: 20.0000 mg | ORAL_TABLET | Freq: Every day | ORAL | Status: DC

## 2015-10-12 NOTE — Telephone Encounter (Signed)
Cody Hale w/ [facility] needs a Discontinued order for the patient's Lisinopril and Lasix.  Please fax the order to her at [phone]

## 2015-10-12 NOTE — Progress Notes (Signed)
Subjective:  Patient ID: Cody Hale, male    DOB: 1951/01/08  Age: 65 y.o. MRN: [id number]  CC: Congestive Heart Failure and Hypertension   HPI Cody Hale presents for follow-up on hypertension and congestive heart failure. Over the last month he has had worsening swelling in both lower extremities more on the left than the right. He denies chest pain, shortness of breath, palpitations, syncope, dizziness, lightheadedness, or back pain. He also complains of chronic, recurrent episodes of nonproductive cough, nasal congestion, sneezing, and runny nose.  Outpatient Prescriptions Prior to Visit  Medication Sig Dispense Refill  . Blood Glucose Monitoring Suppl (ONETOUCH VERIO IQ SYSTEM) W/DEVICE KIT 1 Act by Does not apply route 3 (three) times daily. 2 kit 0  . carvedilol (COREG) 6.25 MG tablet TAKE 1 TABLET BY MOUTH TWICE DAILY. 60 tablet 11  . clonazePAM (KLONOPIN) 1 MG tablet Take 1 tablet (1 mg total) by mouth 2 (two) times daily as needed for anxiety. 60 tablet 5  . glucose blood (ACCU-CHEK AVIVA PLUS) test strip Use to check blood sugars three times a day Dx e11.9 100 each 5  . HYDROcodone-acetaminophen (NORCO) 10-325 MG tablet Take 1 tablet by mouth every 8 (eight) hours as needed. 75 tablet 0  . Lancets (ONETOUCH ULTRASOFT) lancets Use to help check blood sugars twice a day E11.8 100 each 3  . metFORMIN (GLUCOPHAGE) 500 MG tablet Take 1 tablet (500 mg total) by mouth 2 (two) times daily with a meal. 180 tablet 1  . Multiple Vitamin (DAILY VITE) TABS TAKE 1 TABLET BY MOUTH ONCE DAILY. 30 tablet 11  . nitroGLYCERIN (NITROSTAT) 0.4 MG SL tablet Place 1 tablet (0.4 mg total) under the tongue every 5 (five) minutes x 3 doses as needed for chest pain. 25 tablet 3  . potassium chloride SA (K-DUR,KLOR-CON) 10 MEQ tablet Take 1 tablet (10 mEq total) by mouth daily. 90 tablet 3  . simvastatin (ZOCOR) 20 MG tablet TAKE 1 TABLET BY MOUTH ONCE DAILY. 30 tablet 11  . Vilazodone HCl (VIIBRYD)  40 MG TABS Take 1 tablet (40 mg total) by mouth daily. 30 tablet 11  . XARELTO 20 MG TABS tablet TAKE 1 TABLET BY MOUTH ONCE DAILY. 30 tablet 5  . azithromycin (ZITHROMAX) 500 MG tablet Take 1 tablet (500 mg total) by mouth daily. 3 tablet 0  . furosemide (LASIX) 20 MG tablet Take 1 tablet (20 mg total) by mouth daily. 90 tablet 3  . lisinopril (PRINIVIL,ZESTRIL) 2.5 MG tablet TAKE 1 TABLET BY MOUTH ONCE DAILY. 30 tablet 11  . Dextromethorphan-Guaifenesin 10-100 MG/5ML liquid Take 5 mLs by mouth every 12 (twelve) hours. 118 mL 0  . doxycycline (VIBRA-TABS) 100 MG tablet Take 1 tablet (100 mg total) by mouth 2 (two) times daily. 14 tablet 0  . promethazine-dextromethorphan (PROMETHAZINE-DM) 6.25-15 MG/5ML syrup Take 5 mLs by mouth 4 (four) times daily as needed for cough. 118 mL 0   No facility-administered medications prior to visit.    ROS Review of Systems  Constitutional: Negative.  Negative for fever, chills, diaphoresis, appetite change and fatigue.  HENT: Negative.   Eyes: Negative.  Negative for visual disturbance.  Respiratory: Negative.  Negative for cough, choking, chest tightness, shortness of breath and stridor.   Cardiovascular: Positive for leg swelling. Negative for chest pain and palpitations.  Gastrointestinal: Negative.  Negative for nausea, vomiting, abdominal pain, diarrhea and constipation.  Endocrine: Negative.   Genitourinary: Negative.  Negative for difficulty urinating.  Musculoskeletal: Negative.  Negative for myalgias, back pain, arthralgias and neck pain.  Skin: Negative.  Negative for color change and rash.  Allergic/Immunologic: Negative.   Neurological: Negative.  Negative for dizziness, tremors, speech difficulty, light-headedness, numbness and headaches.  Hematological: Negative.  Negative for adenopathy. Does not bruise/bleed easily.  Psychiatric/Behavioral: Negative.     Objective:  BP 128/80 mmHg  Pulse 67  Temp(Src) 98.8 F (37.1 C) (Oral)  Resp  16  Ht _0  (1.778 m)  Wt 277 lb (125.646 kg)  BMI 39.75 kg/m2  SpO2 95%  BP Readings from Last 3 Encounters:  10/12/15 128/80  08/18/15 134/60  07/20/15 126/70    Wt Readings from Last 3 Encounters:  10/12/15 277 lb (125.646 kg)  08/18/15 270 lb (122.471 kg)  07/20/15 247 lb (112.038 kg)    Physical Exam  Constitutional: He is oriented to person, place, and time. No distress.  HENT:  Mouth/Throat: Oropharynx is clear and moist. No oropharyngeal exudate.  Eyes: Conjunctivae are normal. Right eye exhibits no discharge. Left eye exhibits no discharge. No scleral icterus.  Neck: Normal range of motion. Neck supple. No JVD present. No tracheal deviation present. No thyromegaly present.  Cardiovascular: Normal rate, regular rhythm, normal heart sounds and intact distal pulses.  Exam reveals no gallop and no friction rub.   No murmur heard. Pulmonary/Chest: Effort normal and breath sounds normal. No stridor. No respiratory distress. He has no wheezes. He has no rales. He exhibits no tenderness.  Abdominal: Soft. Bowel sounds are normal. He exhibits no distension and no mass. There is no tenderness. There is no rebound and no guarding.  Musculoskeletal: Normal range of motion. He exhibits edema. He exhibits no tenderness.  Over bilateral lower extremity venous there is mild pitting edema, on the left lower extremities it measures 1+ right lower extremity trace.  Lymphadenopathy:    He has no cervical adenopathy.  Neurological: He is oriented to person, place, and time.  Skin: Skin is warm and dry. No rash noted. He is not diaphoretic. No erythema. No pallor.  Vitals reviewed.   Lab Results  Component Value Date   WBC 3.5* 10/12/2015   HGB 13.7 10/12/2015   HCT 41.4 10/12/2015   PLT 103.0 R* 10/12/2015   GLUCOSE 143* 10/12/2015   CHOL 94 04/25/2015   TRIG 74.0 04/25/2015   HDL 45.30 04/25/2015   LDLDIRECT 84.2 01/31/2014   LDLCALC 34 04/25/2015   ALT 22 10/12/2015   AST 19  10/12/2015   NA 140 10/12/2015   K 4.8 10/12/2015   CL 103 10/12/2015   CREATININE 0.80 10/12/2015   BUN 15 10/12/2015   CO2 31 10/12/2015   TSH 0.90 10/12/2015   PSA 0.36 05/30/2014   INR 1.13 11/30/2012   HGBA1C 6.4 07/20/2015   MICROALBUR 0.7 11/22/2014    No results found.  Assessment & Plan:   Tabitha was seen today for congestive heart failure and hypertension.  Diagnoses and all orders for this visit:  Essential hypertension, malignant- his blood pressures well controlled, electrolytes and renal function are stable. -     Comprehensive metabolic panel; Future -     CBC with Differential/Platelet; Future -     TSH; Future -     Urinalysis, Routine w reflex microscopic (not at [facility]); Future -     valsartan (DIOVAN) 40 MG tablet; Take 1 tablet (40 mg total) by mouth 2 (two) times daily.  Primary osteoarthritis of both hips  Chronic systolic heart failure ([place])- he appears to  have a chronic nonproductive cough related to the ACE inhibitor and other upper respiratory symptoms as well. I've asked him to stop the ACE inhibitor and switch to an ARB. Will also change to torsemide for better diuresis. -     Brain natriuretic peptide; Future -     valsartan (DIOVAN) 40 MG tablet; Take 1 tablet (40 mg total) by mouth 2 (two) times daily. -     torsemide (DEMADEX) 20 MG tablet; Take 1 tablet (20 mg total) by mouth daily.  Bilateral leg edema- his d-dimer is not elevated, his BNP is lower than its baseline, the remainder of his labs do not show any secondary or metabolic causes for edema. There are no signs of pulmonary edema or cardiac decompensation, DD me he is experiencing appears to be related to obesity, sedentary lifestyle, and dietary indiscretions. I think torsemide will be a better loop diuretic for him then furosemide due to its once a day dosing and better absorption soft made that change. -     Comprehensive metabolic panel; Future -     CBC with Differential/Platelet;  Future -     TSH; Future -     Urinalysis, Routine w reflex microscopic (not at [facility]); Future -     D-dimer, quantitative (not at [facility]); Future -     Brain natriuretic peptide; Future -     torsemide (DEMADEX) 20 MG tablet; Take 1 tablet (20 mg total) by mouth daily.  I have discontinued Mr. Waymire furosemide, lisinopril, promethazine-dextromethorphan, doxycycline, azithromycin, and Dextromethorphan-Guaifenesin. I am also having him start on valsartan and torsemide. Additionally, I am having him maintain his nitroGLYCERIN, ONETOUCH VERIO IQ SYSTEM, onetouch ultrasoft, potassium chloride, Vilazodone HCl, DAILY VITE, XARELTO, carvedilol, simvastatin, clonazePAM, HYDROcodone-acetaminophen, metFORMIN, and glucose blood.  Meds ordered this encounter  Medications  . valsartan (DIOVAN) 40 MG tablet    Sig: Take 1 tablet (40 mg total) by mouth 2 (two) times daily.    Dispense:  180 tablet    Refill:  3  . torsemide (DEMADEX) 20 MG tablet    Sig: Take 1 tablet (20 mg total) by mouth daily.    Dispense:  90 tablet    Refill:  1     Follow-up: Return in about 4 weeks (around 11/09/2015).  Scarlette Calico, MD

## 2015-10-12 NOTE — Progress Notes (Signed)
Pre visit review using our clinic review tool, if applicable. No additional management support is needed unless otherwise documented below in the visit note. 

## 2015-10-12 NOTE — Patient Instructions (Signed)
Edema °Edema is an abnormal buildup of fluids in your body tissues. Edema is somewhat dependent on gravity to pull the fluid to the lowest place in your body. That makes the condition more common in the legs and thighs (lower extremities). Painless swelling of the feet and ankles is common and becomes more likely as you get older. It is also common in looser tissues, like around your eyes.  °When the affected area is squeezed, the fluid may move out of that spot and leave a dent for a few moments. This dent is called pitting.  °CAUSES  °There are many possible causes of edema. Eating too much salt and being on your feet or sitting for a long time can cause edema in your legs and ankles. Hot weather may make edema worse. Common medical causes of edema include: °· Heart failure. °· Liver disease. °· Kidney disease. °· Weak blood vessels in your legs. °· Cancer. °· An injury. °· Pregnancy. °· Some medications. °· Obesity.  °SYMPTOMS  °Edema is usually painless. Your skin may look swollen or shiny.  °DIAGNOSIS  °Your health care provider may be able to diagnose edema by asking about your medical history and doing a physical exam. You may need to have tests such as X-rays, an electrocardiogram, or blood tests to check for medical conditions that may cause edema.  °TREATMENT  °Edema treatment depends on the cause. If you have heart, liver, or kidney disease, you need the treatment appropriate for these conditions. General treatment may include: °· Elevation of the affected body part above the level of your heart. °· Compression of the affected body part. Pressure from elastic bandages or support stockings squeezes the tissues and forces fluid back into the blood vessels. This keeps fluid from entering the tissues. °· Restriction of fluid and salt intake. °· Use of a water pill (diuretic). These medications are appropriate only for some types of edema. They pull fluid out of your body and make you urinate more often. This  gets rid of fluid and reduces swelling, but diuretics can have side effects. Only use diuretics as directed by your health care provider. °HOME CARE INSTRUCTIONS  °· Keep the affected body part above the level of your heart when you are lying down.   °· Do not sit still or stand for prolonged periods.   °· Do not put anything directly under your knees when lying down. °· Do not wear constricting clothing or garters on your upper legs.   °· Exercise your legs to work the fluid back into your blood vessels. This may help the swelling go down.   °· Wear elastic bandages or support stockings to reduce ankle swelling as directed by your health care provider.   °· Eat a low-salt diet to reduce fluid if your health care provider recommends it.   °· Only take medicines as directed by your health care provider.  °SEEK MEDICAL CARE IF:  °· Your edema is not responding to treatment. °· You have heart, liver, or kidney disease and notice symptoms of edema. °· You have edema in your legs that does not improve after elevating them.   °· You have sudden and unexplained weight gain. °SEEK IMMEDIATE MEDICAL CARE IF:  °· You develop shortness of breath or chest pain.   °· You cannot breathe when you lie down. °· You develop pain, redness, or warmth in the swollen areas.   °· You have heart, liver, or kidney disease and suddenly get edema. °· You have a fever and your symptoms suddenly get worse. °MAKE SURE YOU:  °·   Understand these instructions. °· Will watch your condition. °· Will get help right away if you are not doing well or get worse. °  °This information is not intended to replace advice given to you by your health care provider. Make sure you discuss any questions you have with your health care provider. °  °Document Released: 06/24/2005 Document Revised: 07/15/2014 Document Reviewed: 04/16/2013 °Elsevier Interactive Patient Education ©2016 [employer] ° °

## 2015-10-12 NOTE — Telephone Encounter (Signed)
Cody Hale w/ [facility] would like to know if Dr. Yetta Barre want to discontinue the Lasix and the Lisinopril medications.

## 2015-10-13 ENCOUNTER — Telehealth: Payer: Self-pay | Admitting: Internal Medicine

## 2015-10-13 NOTE — Telephone Encounter (Signed)
Is requesting lab results °

## 2015-10-13 NOTE — Telephone Encounter (Signed)
Done

## 2015-10-13 NOTE — Telephone Encounter (Signed)
Spoke with [facility] they are requesting copy of pt labs for his chart. Please advise. Fax number is [phone].

## 2015-10-24 IMAGING — CR DG CHEST 2V
2 series · 2 of 2 positions shown · non-contrast
Comparison: 08/20/2013

CLINICAL DATA: Cough and congestion for 4 days.

EXAM:
CHEST  2 VIEW

[view not recorded (1 of 2)]
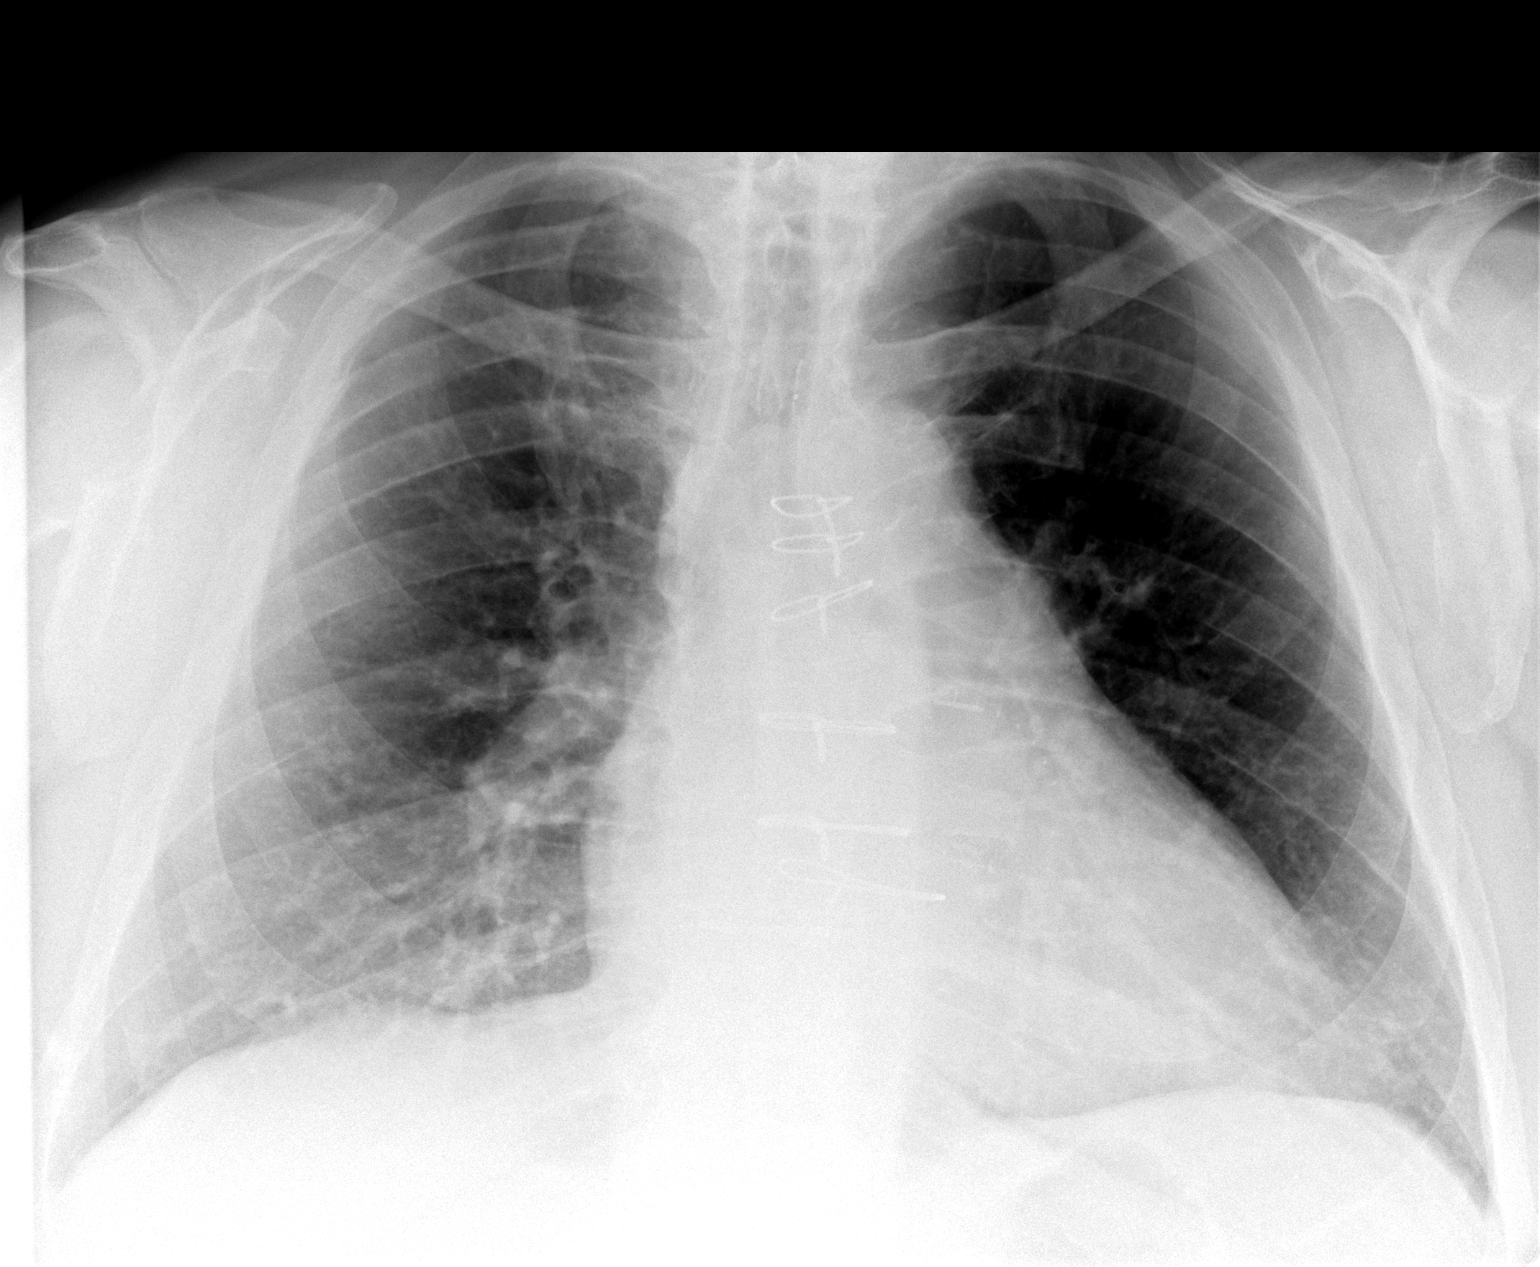

[view not recorded (2 of 2)]
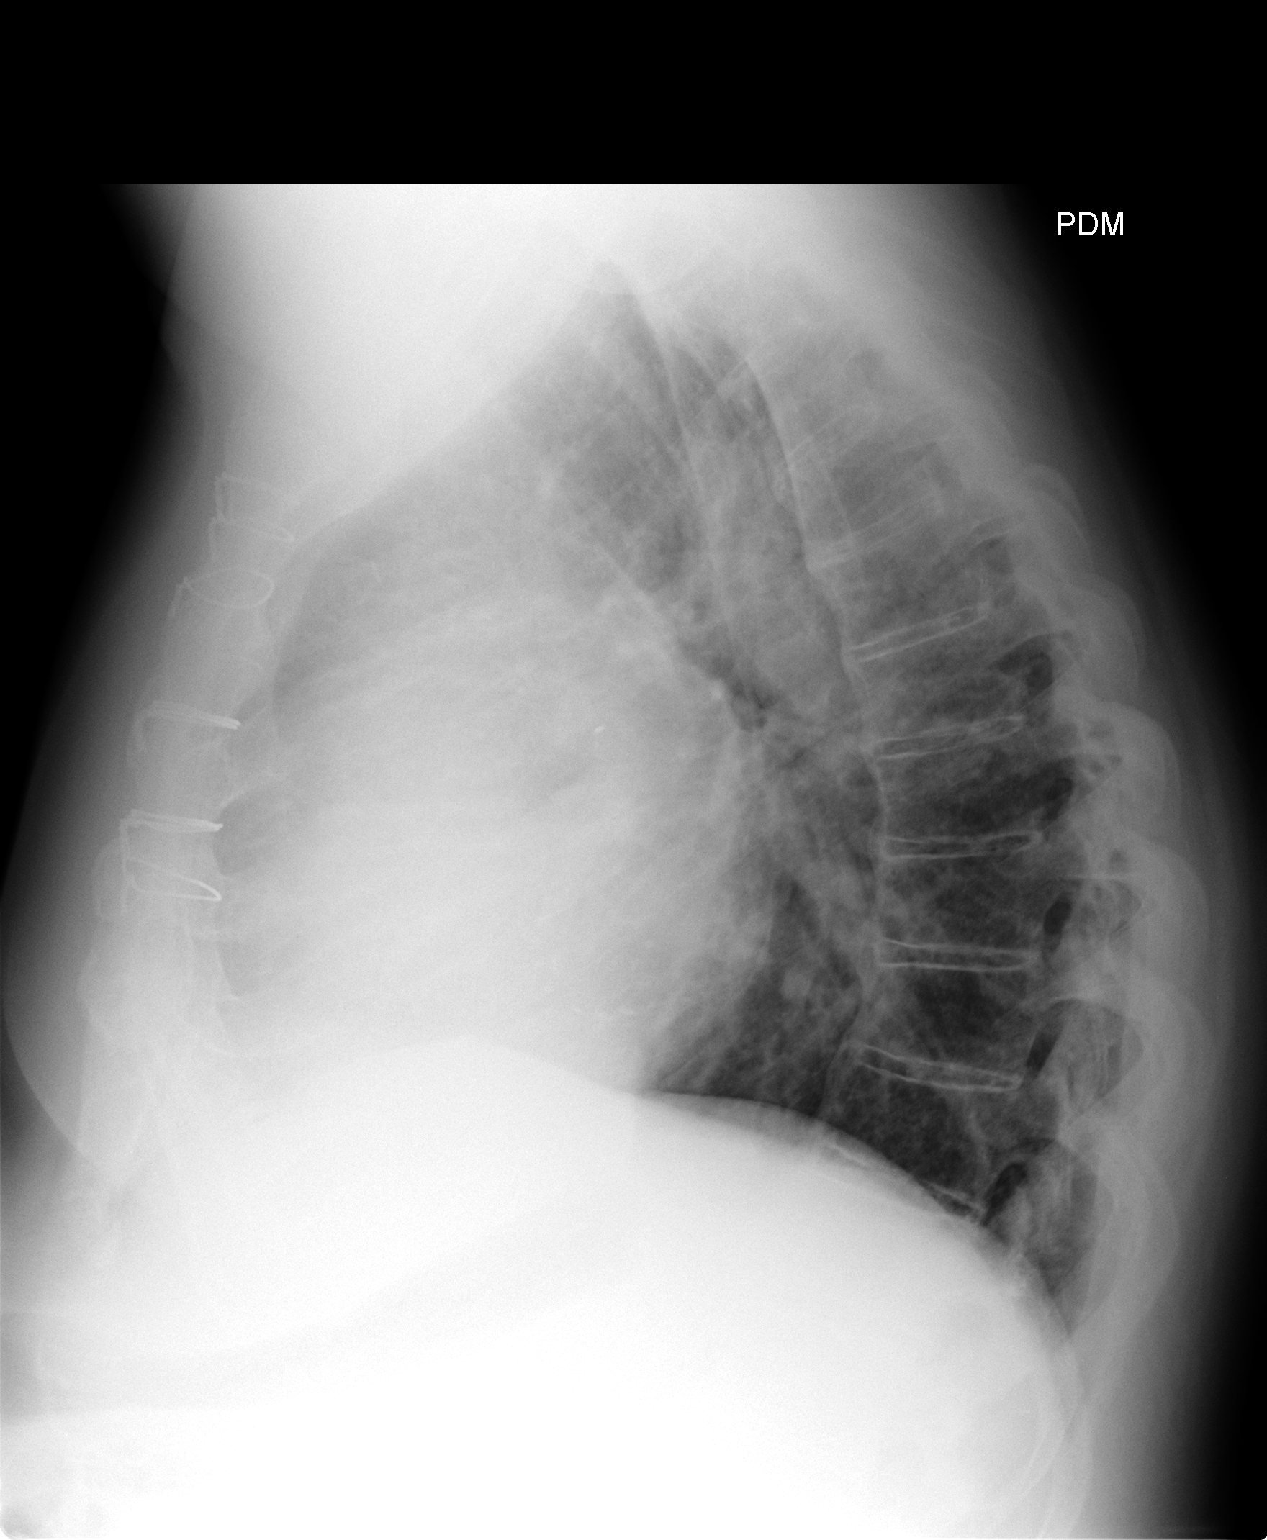

[2 of 2 positions shown; findings below may reference images not displayed]

FINDINGS: Lateral imaging is underpenetrated and suboptimal.

Chronic cardiomegaly. Status post median sternotomy with faint
changes of mitral valve replacement. Stable aortic and hilar
contours. There is no convincing pneumonia. Interstitial coarsening
at the bases is unchanged from previous. No edema, effusion, or
pneumothorax. There is either diffuse idiopathic skeletal
hyperostosis or ankylosing spondylitis in the thoracic spine. Remote
upper right rib fractures.
IMPRESSION: Stable exam.  No convincing pneumonia.

## 2015-10-31 ENCOUNTER — Telehealth: Payer: Self-pay

## 2015-10-31 DIAGNOSIS — F450 Somatization disorder: Principal | ICD-10-CM

## 2015-10-31 DIAGNOSIS — F329 Major depressive disorder, single episode, unspecified: Secondary | ICD-10-CM

## 2015-10-31 DIAGNOSIS — F32A Depression, unspecified: Secondary | ICD-10-CM

## 2015-10-31 MED ORDER — CLONAZEPAM 1 MG PO TABS: 1.0000 mg | ORAL_TABLET | Freq: Two times a day (BID) | ORAL | Status: DC | PRN

## 2015-10-31 NOTE — Telephone Encounter (Signed)
Recd faxed rx refill request for clonazepam 1mg  tab from [facility] on [address] in [place]---please advise, thanks

## 2015-10-31 NOTE — Telephone Encounter (Signed)
Rx faxed back to [facility]...Raechel Chute

## 2015-10-31 NOTE — Telephone Encounter (Signed)
rx written

## 2015-11-07 ENCOUNTER — Telehealth: Payer: Self-pay | Admitting: *Deleted

## 2015-11-07 NOTE — Telephone Encounter (Signed)
Pharmacist left msg on triage stating fax over a d/c order for pt Clonazepam, but someone faxed bck for renewal. Pt has never had clonazepam fill, and the rx had expired...Raechel Chute

## 2015-11-08 DIAGNOSIS — M79675 Pain in left toe(s): Secondary | ICD-10-CM | POA: Diagnosis not present

## 2015-11-08 DIAGNOSIS — M79674 Pain in right toe(s): Secondary | ICD-10-CM | POA: Diagnosis not present

## 2015-11-08 DIAGNOSIS — B351 Tinea unguium: Secondary | ICD-10-CM | POA: Diagnosis not present

## 2015-11-09 ENCOUNTER — Encounter: Payer: Self-pay | Admitting: Internal Medicine

## 2015-11-09 ENCOUNTER — Ambulatory Visit (INDEPENDENT_AMBULATORY_CARE_PROVIDER_SITE_OTHER): Payer: Medicare Other | Admitting: Internal Medicine

## 2015-11-09 ENCOUNTER — Other Ambulatory Visit (INDEPENDENT_AMBULATORY_CARE_PROVIDER_SITE_OTHER): Payer: Medicare Other

## 2015-11-09 VITALS — BP 122/60 | HR 55 | Temp 97.9°F | Resp 16 | Ht 70.0 in | Wt 275.0 lb

## 2015-11-09 DIAGNOSIS — Z794 Long term (current) use of insulin: Secondary | ICD-10-CM

## 2015-11-09 DIAGNOSIS — E118 Type 2 diabetes mellitus with unspecified complications: Secondary | ICD-10-CM

## 2015-11-09 DIAGNOSIS — I10 Essential (primary) hypertension: Secondary | ICD-10-CM

## 2015-11-09 LAB — BASIC METABOLIC PANEL
BUN: 21 mg/dL (ref 6–23)
CO2: 32 mEq/L (ref 19–32)
Calcium: 9.7 mg/dL (ref 8.4–10.5)
Chloride: 101 mEq/L (ref 96–112)
Creatinine, Ser: 0.92 mg/dL (ref 0.40–1.50)
GFR: 87.75 mL/min (ref >60.00)
Glucose, Bld: 138 mg/dL — ABNORMAL HIGH (ref 70–99)
Potassium: 5 mEq/L (ref 3.5–5.1)
Sodium: 141 mEq/L (ref 135–145)

## 2015-11-09 LAB — HEMOGLOBIN A1C: Hgb A1c MFr Bld: 7.5 % — ABNORMAL HIGH (ref 4.6–6.5)

## 2015-11-09 MED ORDER — INSULIN GLARGINE 100 UNIT/ML SOLOSTAR PEN: 30.0000 [IU] | PEN_INJECTOR | Freq: Every day | SUBCUTANEOUS | Status: DC

## 2015-11-09 MED ORDER — METFORMIN HCL ER 750 MG PO TB24: 1500.0000 mg | ORAL_TABLET | Freq: Every day | ORAL | Status: DC

## 2015-11-09 NOTE — Progress Notes (Signed)
Subjective:  Patient ID: Cody Hale, male    DOB: 1950/07/26  Age: 65 y.o. MRN: [id number]  CC: Diabetes   HPI Cody Hale presents for follow-up.  I last saw him he complained of a cough that I suspected was due to ACE inhibitor therapy. ACE inhibitor has been discontinued and his cough has resolved.  He is concerned today about high blood sugar. He complains that recently he has noted some fasting blood sugars of 181, 200, and 245. He also complains of polyuria but has not had any episodes of visual disturbance, polydipsia, or polyphagia. His weight is been stable.  Outpatient Prescriptions Prior to Visit  Medication Sig Dispense Refill  . Blood Glucose Monitoring Suppl (ONETOUCH VERIO IQ SYSTEM) W/DEVICE KIT 1 Act by Does not apply route 3 (three) times daily. 2 kit 0  . carvedilol (COREG) 6.25 MG tablet TAKE 1 TABLET BY MOUTH TWICE DAILY. 60 tablet 11  . clonazePAM (KLONOPIN) 1 MG tablet Take 1 tablet (1 mg total) by mouth 2 (two) times daily as needed for anxiety. 60 tablet 5  . glucose blood (ACCU-CHEK AVIVA PLUS) test strip Use to check blood sugars three times a day Dx e11.9 100 each 5  . HYDROcodone-acetaminophen (NORCO) 10-325 MG tablet Take 1 tablet by mouth every 8 (eight) hours as needed. 75 tablet 0  . Lancets (ONETOUCH ULTRASOFT) lancets Use to help check blood sugars twice a day E11.8 100 each 3  . Multiple Vitamin (DAILY VITE) TABS TAKE 1 TABLET BY MOUTH ONCE DAILY. 30 tablet 11  . nitroGLYCERIN (NITROSTAT) 0.4 MG SL tablet Place 1 tablet (0.4 mg total) under the tongue every 5 (five) minutes x 3 doses as needed for chest pain. 25 tablet 3  . potassium chloride SA (K-DUR,KLOR-CON) 10 MEQ tablet Take 1 tablet (10 mEq total) by mouth daily. 90 tablet 3  . simvastatin (ZOCOR) 20 MG tablet TAKE 1 TABLET BY MOUTH ONCE DAILY. 30 tablet 11  . torsemide (DEMADEX) 20 MG tablet Take 1 tablet (20 mg total) by mouth daily. 90 tablet 1  . valsartan (DIOVAN) 40 MG tablet Take  1 tablet (40 mg total) by mouth 2 (two) times daily. 180 tablet 3  . Vilazodone HCl (VIIBRYD) 40 MG TABS Take 1 tablet (40 mg total) by mouth daily. 30 tablet 11  . XARELTO 20 MG TABS tablet TAKE 1 TABLET BY MOUTH ONCE DAILY. 30 tablet 5  . metFORMIN (GLUCOPHAGE) 500 MG tablet Take 1 tablet (500 mg total) by mouth 2 (two) times daily with a meal. 180 tablet 1   No facility-administered medications prior to visit.    ROS Review of Systems  Constitutional: Negative.  Negative for fever, chills, diaphoresis, appetite change and fatigue.  HENT: Negative.  Negative for sinus pressure and sore throat.   Eyes: Negative.  Negative for visual disturbance.  Respiratory: Positive for cough. Negative for choking, chest tightness, shortness of breath and stridor.   Cardiovascular: Negative.  Negative for chest pain, palpitations and leg swelling.  Gastrointestinal: Negative.  Negative for nausea, vomiting, abdominal pain and diarrhea.  Endocrine: Positive for polyuria. Negative for polydipsia and polyphagia.  Genitourinary: Positive for frequency. Negative for dysuria, urgency and enuresis.  Musculoskeletal: Negative.   Skin: Negative.   Allergic/Immunologic: Negative.   Neurological: Negative.  Negative for dizziness.  Hematological: Negative.  Negative for adenopathy. Does not bruise/bleed easily.  Psychiatric/Behavioral: Negative.     Objective:  BP 122/60 mmHg  Pulse 55  Temp(Src) 97.9 F (  36.6 C) (Oral)  Resp 16  Ht _0  (1.778 m)  Wt 275 lb (124.739 kg)  BMI 39.46 kg/m2  SpO2 96%  BP Readings from Last 3 Encounters:  11/09/15 122/60  10/12/15 128/80  08/18/15 134/60    Wt Readings from Last 3 Encounters:  11/09/15 275 lb (124.739 kg)  10/12/15 277 lb (125.646 kg)  08/18/15 270 lb (122.471 kg)    Physical Exam  Constitutional: He is oriented to person, place, and time. No distress.  HENT:  Mouth/Throat: Oropharynx is clear and moist. No oropharyngeal exudate.  Eyes:  Conjunctivae are normal. Right eye exhibits no discharge. Left eye exhibits no discharge. No scleral icterus.  Neck: Normal range of motion. Neck supple. No JVD present. No tracheal deviation present. No thyromegaly present.  Cardiovascular: Normal rate, regular rhythm, normal heart sounds and intact distal pulses.  Exam reveals no gallop and no friction rub.   No murmur heard. Pulmonary/Chest: Effort normal and breath sounds normal. No stridor. No respiratory distress. He has no wheezes. He has no rales. He exhibits no tenderness.  Abdominal: Soft. Bowel sounds are normal. He exhibits no distension and no mass. There is no tenderness. There is no rebound and no guarding.  Musculoskeletal: Normal range of motion. He exhibits no edema or tenderness.  Lymphadenopathy:    He has no cervical adenopathy.  Neurological: He is oriented to person, place, and time.  Skin: Skin is warm and dry. No rash noted. He is not diaphoretic. No erythema. No pallor.  Vitals reviewed.   Lab Results  Component Value Date   WBC 3.5* 10/12/2015   HGB 13.7 10/12/2015   HCT 41.4 10/12/2015   PLT 103.0 R* 10/12/2015   GLUCOSE 138* 11/09/2015   CHOL 94 04/25/2015   TRIG 74.0 04/25/2015   HDL 45.30 04/25/2015   LDLDIRECT 84.2 01/31/2014   LDLCALC 34 04/25/2015   ALT 22 10/12/2015   AST 19 10/12/2015   NA 141 11/09/2015   K 5.0 11/09/2015   CL 101 11/09/2015   CREATININE 0.92 11/09/2015   BUN 21 11/09/2015   CO2 32 11/09/2015   TSH 0.90 10/12/2015   PSA 0.36 05/30/2014   INR 1.13 11/30/2012   HGBA1C 7.5* 11/09/2015   MICROALBUR 0.7 11/22/2014    No results found.  Assessment & Plan:   Cody Hale was seen today for diabetes.  Diagnoses and all orders for this visit:  Type 2 diabetes mellitus with complication, with long-term current use of insulin ([place])- he reports fasting hyperglycemia and his A1c is up to 7.5%. He will continue metformin but I also think he should add a daily basal insulin. -      Hemoglobin A1c; Future -     Basic metabolic panel; Future -     metFORMIN (GLUCOPHAGE-XR) 750 MG 24 hr tablet; Take 2 tablets (1,500 mg total) by mouth daily with breakfast. -     Insulin Glargine (LANTUS SOLOSTAR) 100 UNIT/ML Solostar Pen; Inject 30 Units into the skin daily at 10 pm.  Essential hypertension, malignant- his cough has resolved since the ACE inhibitor has been discontinued and his blood pressure is well-controlled on torsemide, valsartan and, and carvedilol.   I have discontinued Mr. Spradley metFORMIN. I am also having him start on metFORMIN and Insulin Glargine. Additionally, I am having him maintain his nitroGLYCERIN, ONETOUCH VERIO IQ SYSTEM, onetouch ultrasoft, potassium chloride, Vilazodone HCl, DAILY VITE, XARELTO, carvedilol, simvastatin, HYDROcodone-acetaminophen, glucose blood, valsartan, torsemide, and clonazePAM.  Meds ordered this encounter  Medications  .  metFORMIN (GLUCOPHAGE-XR) 750 MG 24 hr tablet    Sig: Take 2 tablets (1,500 mg total) by mouth daily with breakfast.    Dispense:  180 tablet    Refill:  1  . Insulin Glargine (LANTUS SOLOSTAR) 100 UNIT/ML Solostar Pen    Sig: Inject 30 Units into the skin daily at 10 pm.    Dispense:  15 mL    Refill:  11     Follow-up: Return in about 4 months (around 03/11/2016).  Scarlette Calico, MD

## 2015-11-09 NOTE — Progress Notes (Signed)
Pre visit review using our clinic review tool, if applicable. No additional management support is needed unless otherwise documented below in the visit note. 

## 2015-11-09 NOTE — Patient Instructions (Signed)
Type 2 Diabetes Mellitus, Adult °Type 2 diabetes mellitus, often simply referred to as type 2 diabetes, is a long-lasting (chronic) disease. In type 2 diabetes, the pancreas does not make enough insulin (a hormone), the cells are less responsive to the insulin that is made (insulin resistance), or both. Normally, insulin moves sugars from food into the tissue cells. The tissue cells use the sugars for energy. The lack of insulin or the lack of normal response to insulin causes excess sugars to build up in the blood instead of going into the tissue cells. As a result, high blood sugar (hyperglycemia) develops. The effect of high sugar (glucose) levels can cause many complications.  °Type 2 diabetes was also previously called adult-onset diabetes, but it can occur at any age.    °RISK FACTORS  °A person is predisposed to developing type 2 diabetes if someone in the family has the disease and also has one or more of the following primary risk factors: °· Weight gain, or being overweight or obese. °· An inactive lifestyle. °· A history of consistently eating high-calorie foods. °Maintaining a normal weight and regular physical activity can reduce the chance of developing type 2 diabetes. °SYMPTOMS  °A person with type 2 diabetes may not show symptoms initially. The symptoms of type 2 diabetes appear slowly. The symptoms include: °· Increased thirst (polydipsia). °· Increased urination (polyuria). °· Increased urination during the night (nocturia). °· Sudden or unexplained weight changes. °· Frequent, recurring infections. °· Tiredness (fatigue). °· Weakness. °· Vision changes, such as blurred vision. °· Fruity smell to your breath. °· Abdominal pain. °· Nausea or vomiting. °· Cuts or bruises which are slow to heal. °· Tingling or numbness in the hands or feet. °· An open skin wound (ulcer). °DIAGNOSIS °Type 2 diabetes is frequently not diagnosed until complications of diabetes are present. Type 2 diabetes is diagnosed  when symptoms or complications are present and when blood glucose levels are increased. Your blood glucose level may be checked by one or more of the following blood tests: °· A fasting blood glucose test. You will not be allowed to eat for at least 8 hours before a blood sample is taken. °· A random blood glucose test. Your blood glucose is checked at any time of the day regardless of when you ate. °· A hemoglobin A1c blood glucose test. A hemoglobin A1c test provides information about blood glucose control over the previous 3 months. °· An oral glucose tolerance test (OGTT). Your blood glucose is measured after you have not eaten (fasted) for 2 hours and then after you drink a glucose-containing beverage. °TREATMENT  °· You may need to take insulin or diabetes medicine daily to keep blood glucose levels in the desired range. °· If you use insulin, you may need to adjust the dosage depending on the carbohydrates that you eat with each meal or snack. °· Lifestyle changes are recommended as part of your treatment. These may include: °¨ Following an individualized diet plan developed by a nutritionist or dietitian. °¨ Exercising daily. °Your health care providers will set individualized treatment goals for you based on your age, your medicines, how long you have had diabetes, and any other medical conditions you have. Generally, the goal of treatment is to maintain the following blood glucose levels: °· Before meals (preprandial): 80-130 mg/dL. °· After meals (postprandial): below 180 mg/dL. °· A1c: less than 6.5-7%. °HOME CARE INSTRUCTIONS  °· Have your hemoglobin A1c level checked twice a year. °· Perform daily blood glucose monitoring   as directed by your health care provider. °· Monitor urine ketones when you are ill and as directed by your health care provider. °· Take your diabetes medicine or insulin as directed by your health care provider to maintain your blood glucose levels in the desired range. °¨ Never run  out of diabetes medicine or insulin. It is needed every day. °¨ If you are using insulin, you may need to adjust the amount of insulin given based on your intake of carbohydrates. Carbohydrates can raise blood glucose levels but need to be included in your diet. Carbohydrates provide vitamins, minerals, and fiber which are an essential part of a healthy diet. Carbohydrates are found in fruits, vegetables, whole grains, dairy products, legumes, and foods containing added sugars. °· Eat healthy foods. You should make an appointment to see a registered dietitian to help you create an eating plan that is right for you. °· Lose weight if you are overweight. °· Carry a medical alert card or wear your medical alert jewelry. °· Carry a 15-gram carbohydrate snack with you at all times to treat low blood glucose (hypoglycemia). Some examples of 15-gram carbohydrate snacks include: °¨ Glucose tablets, 3 or 4. °¨ Glucose gel, 15-gram tube. °¨ Raisins, 2 tablespoons (24 grams). °¨ Jelly beans, 6. °¨ Animal crackers, 8. °¨ Regular pop, 4 ounces (120 mL). °¨ Gummy treats, 9. °· Recognize hypoglycemia. Hypoglycemia occurs with blood glucose levels of 70 mg/dL and below. The risk for hypoglycemia increases when fasting or skipping meals, during or after intense exercise, and during sleep. Hypoglycemia symptoms can include: °¨ Tremors or shakes. °¨ Decreased ability to concentrate. °¨ Sweating. °¨ Increased heart rate. °¨ Headache. °¨ Dry mouth. °¨ Hunger. °¨ Irritability. °¨ Anxiety. °¨ Restless sleep. °¨ Altered speech or coordination. °¨ Confusion. °· Treat hypoglycemia promptly. If you are alert and able to safely swallow, follow the 15:15 rule: °¨ Take 15-20 grams of rapid-acting glucose or carbohydrate. Rapid-acting options include glucose gel, glucose tablets, or 4 ounces (120 mL) of fruit juice, regular soda, or low-fat milk. °¨ Check your blood glucose level 15 minutes after taking the glucose. °¨ Take 15-20 grams more of  glucose if the repeat blood glucose level is still 70 mg/dL or below. °¨ Eat a meal or snack within 1 hour once blood glucose levels return to normal. °· Be alert to feeling very thirsty and urinating more frequently than usual, which are early signs of hyperglycemia. An early awareness of hyperglycemia allows for prompt treatment. Treat hyperglycemia as directed by your health care provider. °· Engage in at least 150 minutes of moderate-intensity physical activity a week, spread over at least 3 days of the week or as directed by your health care provider. In addition, you should engage in resistance exercise at least 2 times a week or as directed by your health care provider. Try to spend no more than 90 minutes at one time inactive. °· Adjust your medicine and food intake as needed if you start a new exercise or sport. °· Follow your sick-day plan anytime you are unable to eat or drink as usual. °· Do not use any tobacco products including cigarettes, chewing tobacco, or electronic cigarettes. If you need help quitting, ask your health care provider. °· Limit alcohol intake to no more than 1 drink per day for nonpregnant women and 2 drinks per day for men. You should drink alcohol only when you are also eating food. Talk with your health care provider whether alcohol is safe   for you. Tell your health care provider if you drink alcohol several times a week. °· Keep all follow-up visits as directed by your health care provider. This is important. °· Schedule an eye exam soon after the diagnosis of type 2 diabetes and then annually. °· Perform daily skin and foot care. Examine your skin and feet daily for cuts, bruises, redness, nail problems, bleeding, blisters, or sores. A foot exam by a health care provider should be done annually. °· Brush your teeth and gums at least twice a day and floss at least once a day. Follow up with your dentist regularly. °· Share your diabetes management plan with your workplace or  school. °· Keep your immunizations up to date. It is recommended that you receive a flu (influenza) vaccine every year. It is also recommended that you receive a pneumonia (pneumococcal) vaccine. If you are 65 years of age or older and have never received a pneumonia vaccine, this vaccine may be given as a series of two separate shots. Ask your health care provider which additional vaccines may be recommended. °· Learn to manage stress. °· Obtain ongoing diabetes education and support as needed. °· Participate in or seek rehabilitation as needed to maintain or improve independence and quality of life. Request a physical or occupational therapy referral if you are having foot or hand numbness, or difficulties with grooming, dressing, eating, or physical activity. °SEEK MEDICAL CARE IF:  °· You are unable to eat food or drink fluids for more than 6 hours. °· You have nausea and vomiting for more than 6 hours. °· Your blood glucose level is over 240 mg/dL. °· There is a change in mental status. °· You develop an additional serious illness. °· You have diarrhea for more than 6 hours. °· You have been sick or have had a fever for a couple of days and are not getting better. °· You have pain during any physical activity.   °SEEK IMMEDIATE MEDICAL CARE IF: °· You have difficulty breathing. °· You have moderate to large ketone levels. °  °This information is not intended to replace advice given to you by your health care provider. Make sure you discuss any questions you have with your health care provider. °  °Document Released: 06/24/2005 Document Revised: 03/15/2015 Document Reviewed: 01/21/2012 °Elsevier Interactive Patient Education ©2016 [employer] ° °

## 2015-11-11 IMAGING — CR DG ABDOMEN ACUTE W/ 1V CHEST
4 series · 4 of 4 positions shown · non-contrast
Comparison: Chest x-ray of 05/12/2014

CLINICAL DATA: Left upper and left lower quadrant abdominal pain
for 1 month, diarrhea

EXAM:
ACUTE ABDOMEN SERIES (ABDOMEN 2 VIEW & CHEST 1 VIEW)

[view not recorded (1 of 4)]
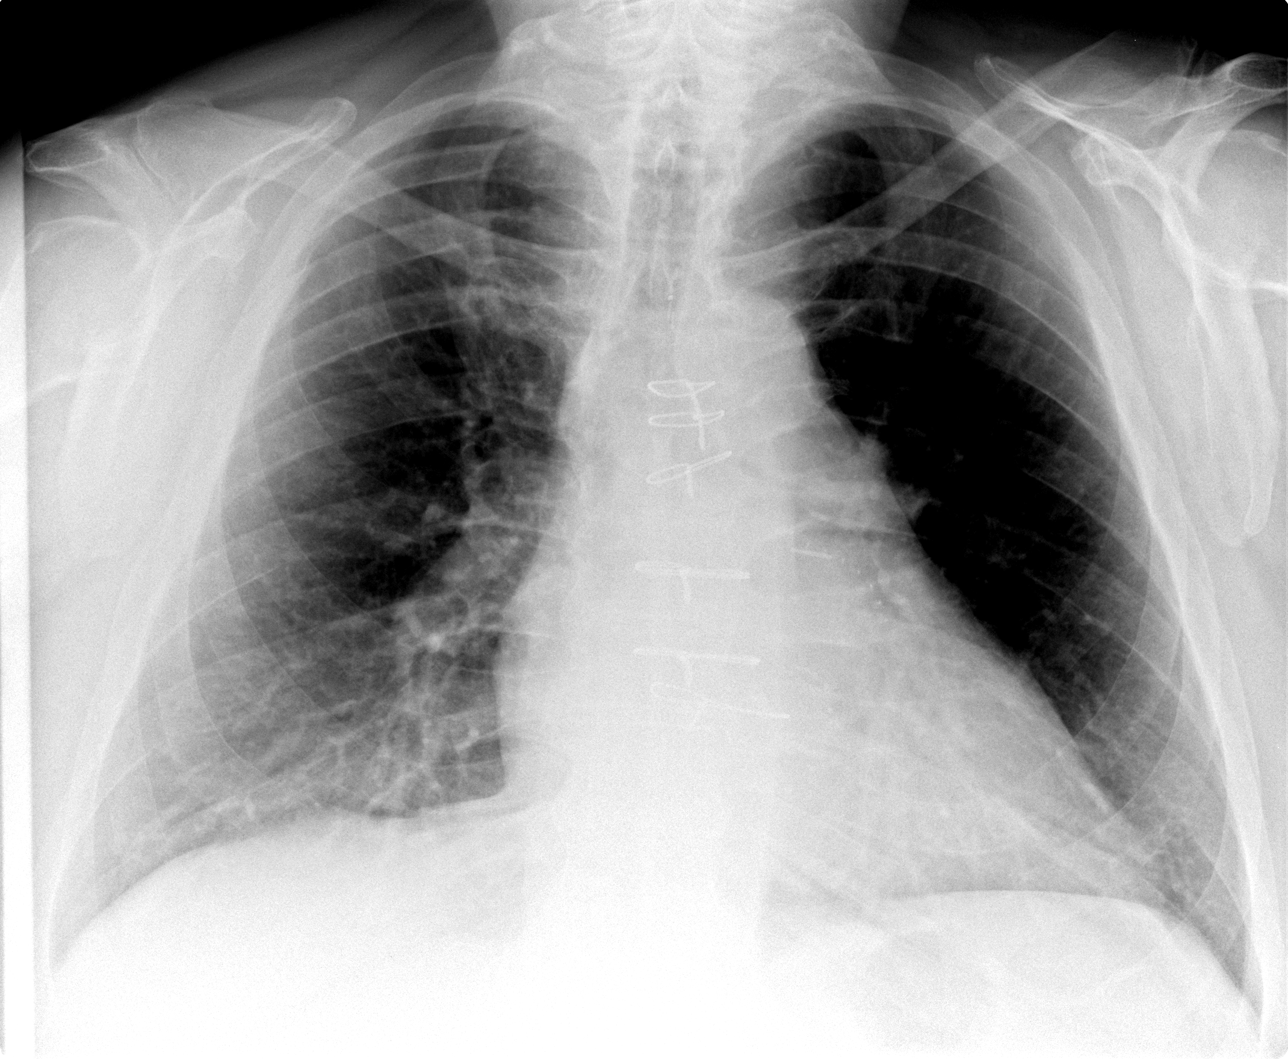

[view not recorded (2 of 4)]
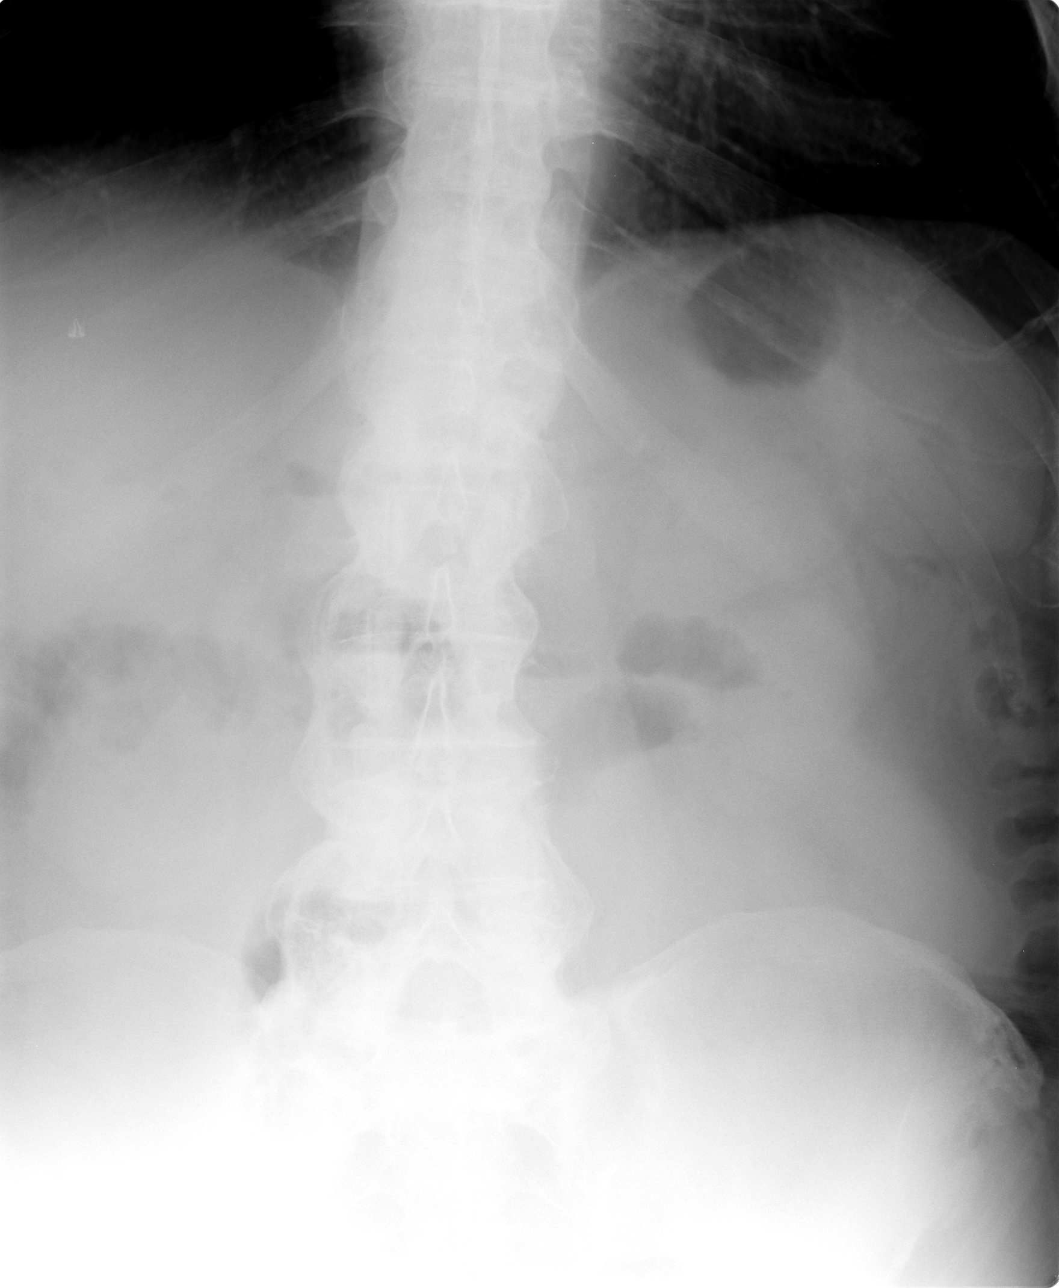

[view not recorded (3 of 4)]
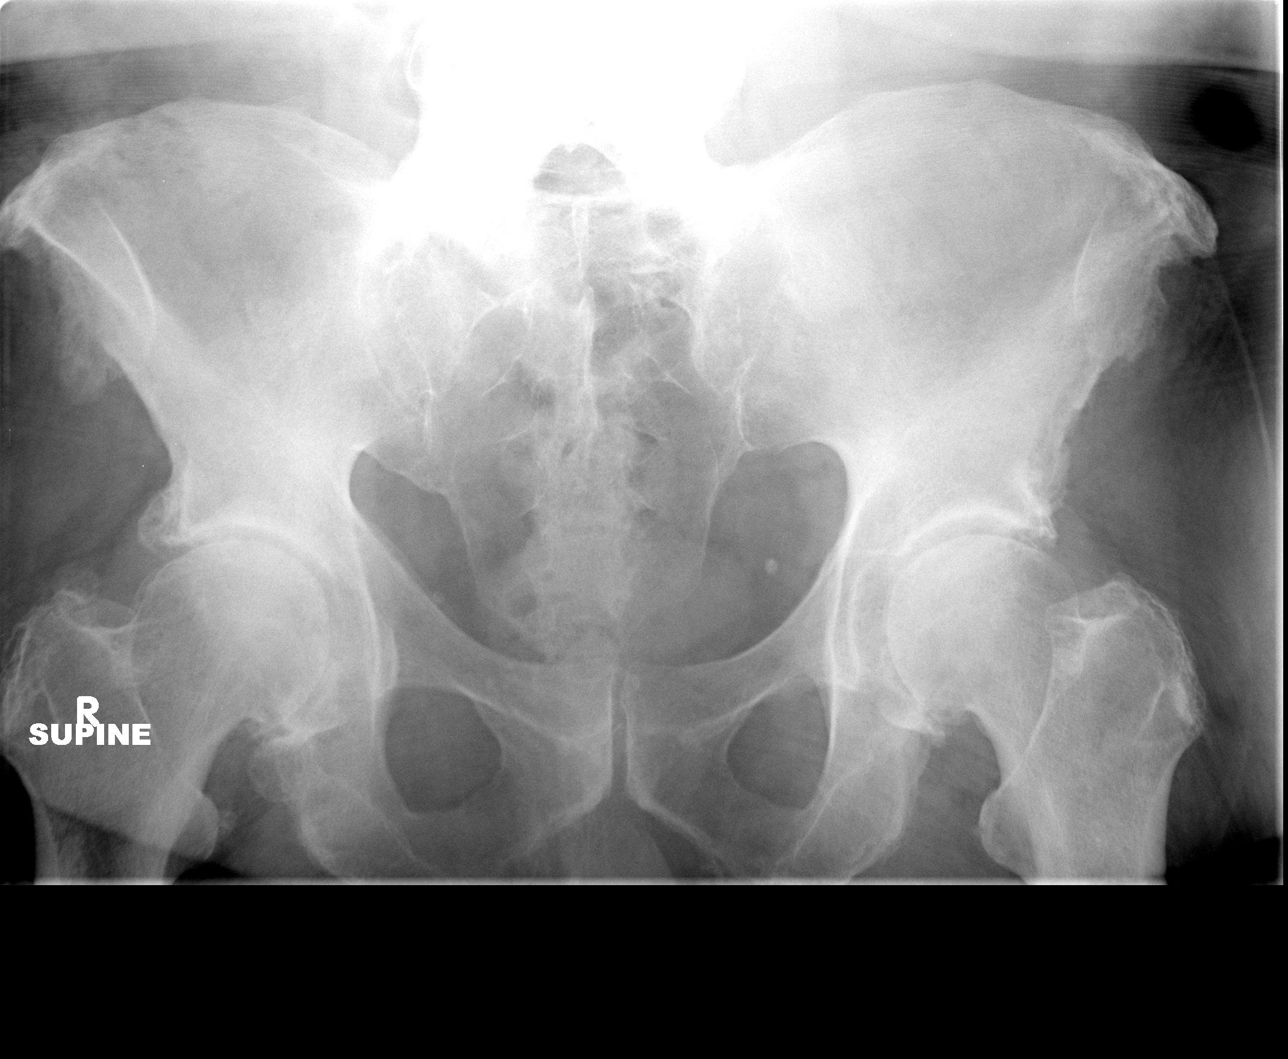

[view not recorded (4 of 4)]
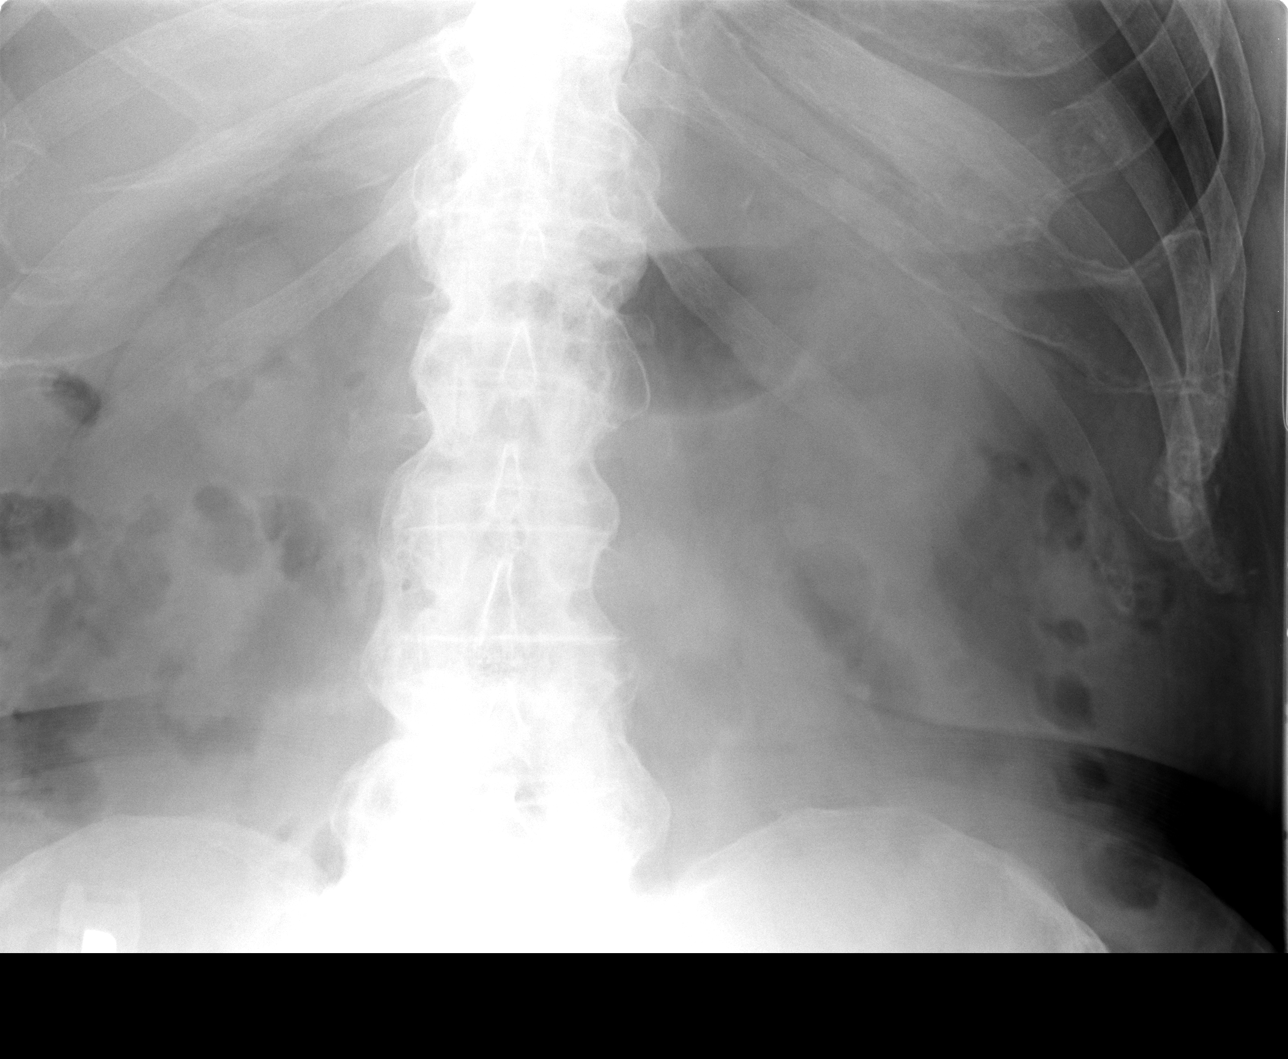

[4 of 4 positions shown; findings below may reference images not displayed]

FINDINGS: No focal infiltrate or effusion is seen. Mediastinal and hilar
contours are unremarkable. The heart is mildly enlarged and stable.
Median sternotomy sutures are noted from prior CABG.

Supine and erect views of the abdomen show no bowel obstruction. No
free air is seen. No opaque calculi are noted. There are
degenerative changes diffusely throughout the lumbar spine.
IMPRESSION: 1. No active lung disease.  Stable cardiomegaly.
2. No bowel obstruction.  No free air.

## 2015-11-29 LAB — HM DIABETES EYE EXAM

## 2015-12-05 ENCOUNTER — Telehealth: Payer: Self-pay

## 2015-12-05 NOTE — Telephone Encounter (Signed)
APPROVED 07/07/2015 trough 07/07/2016

## 2015-12-05 NOTE — Telephone Encounter (Signed)
PA initiated via CoverMyMeds  Key [id number]

## 2015-12-06 ENCOUNTER — Telehealth: Payer: Self-pay | Admitting: Internal Medicine

## 2015-12-06 ENCOUNTER — Other Ambulatory Visit: Payer: Self-pay

## 2015-12-06 DIAGNOSIS — M170 Bilateral primary osteoarthritis of knee: Secondary | ICD-10-CM

## 2015-12-06 DIAGNOSIS — M160 Bilateral primary osteoarthritis of hip: Secondary | ICD-10-CM

## 2015-12-06 MED ORDER — HYDROCODONE-ACETAMINOPHEN 10-325 MG PO TABS: 1.0000 | ORAL_TABLET | Freq: Three times a day (TID) | ORAL | Status: DC | PRN

## 2015-12-06 NOTE — Telephone Encounter (Signed)
Caregiver called from assisted living and hung up before I could clarify her name or get her call back number as she had me on speaker phone and I could not hear her.  She did state she had called earlier today to let MD know pt was out of his pain medication and had been out of it since the weekend.

## 2015-12-06 NOTE — Telephone Encounter (Signed)
Unable to leave msg on number

## 2015-12-06 NOTE — Telephone Encounter (Signed)
Ok to fill 

## 2015-12-06 NOTE — Telephone Encounter (Signed)
HYDROcodone-acetaminophen (NORCO) 10-325 MG tablet [[id number]]      Patient is requesting a refill on this medication. Thank you.

## 2015-12-07 NOTE — Telephone Encounter (Signed)
I called the number we had listed and it was invalid. This was in refill notation, fyi.

## 2015-12-07 NOTE — Telephone Encounter (Signed)
In front cabinet

## 2015-12-07 NOTE — Telephone Encounter (Signed)
Called and informed. They wanted [place] to fax. I informed we cannot fax narcotics. PT will have to come pick up or someone can come pick up in his absence from home.

## 2015-12-07 NOTE — Telephone Encounter (Signed)
Cody Hale [phone]  from long term care.    Pt needs pain meds refilled

## 2015-12-07 NOTE — Telephone Encounter (Signed)
I wrote this prescription yesterday

## 2015-12-11 ENCOUNTER — Ambulatory Visit (INDEPENDENT_AMBULATORY_CARE_PROVIDER_SITE_OTHER): Payer: Medicare Other | Admitting: *Deleted

## 2015-12-11 DIAGNOSIS — I480 Paroxysmal atrial fibrillation: Secondary | ICD-10-CM | POA: Diagnosis not present

## 2015-12-11 NOTE — Progress Notes (Signed)
Pt was started on Xarelto 20mg  qhs for atrial fib on 05/06/14 by Dr Purvis Sheffield.   Pt denies any problems taking Xarelto. Denies andy unusual bleeding, bruising or GI upset.  Reviewed patients medication list. Pt is not currently on any combined P-gp and strong CYP3A4 inhibitors/inducers (ketoconazole, traconazole, ritonavir, carbamazepine, phenytoin, rifampin, St. John's wort). Reviewed labs from 11/09/15: SCr 0.92, Weight 275, CrCl 141.23 Dose is appropriate based on CrCl. 10/12/15: Hgb and HCT: 13.7/41.4   A full discussion of the nature of anticoagulants has been carried out. A benefit/risk analysis has been presented to the patient, so that they understand the justification for choosing anticoagulation with Xarelto at this time. The need for compliance is stressed. Pt is aware to take the medication once daily with the largest meal of the day. Side effects of potential bleeding are discussed, including unusual colored urine or stools, coughing up blood or coffee ground emesis, nose bleeds or serious fall or head trauma. Discussed signs and symptoms of stroke. The patient should avoid any OTC items containing aspirin or ibuprofen. Avoid alcohol consumption. Call if any signs of abnormal bleeding. Discussed financial obligations and resolved any difficulty in obtaining medication.  Lab results discussed with pt.  Next lab test in 6 months. Placed in recall for 12/17.

## 2015-12-12 ENCOUNTER — Ambulatory Visit (INDEPENDENT_AMBULATORY_CARE_PROVIDER_SITE_OTHER): Payer: Medicare Other | Admitting: Internal Medicine

## 2015-12-12 ENCOUNTER — Encounter: Payer: Self-pay | Admitting: Internal Medicine

## 2015-12-12 VITALS — BP 130/70 | HR 75 | Temp 98.6°F | Resp 16 | Ht 70.0 in | Wt 286.0 lb

## 2015-12-12 DIAGNOSIS — M159 Polyosteoarthritis, unspecified: Secondary | ICD-10-CM

## 2015-12-12 DIAGNOSIS — M8949 Other hypertrophic osteoarthropathy, multiple sites: Secondary | ICD-10-CM

## 2015-12-12 DIAGNOSIS — B3789 Other sites of candidiasis: Secondary | ICD-10-CM | POA: Diagnosis not present

## 2015-12-12 DIAGNOSIS — M150 Primary generalized (osteo)arthritis: Secondary | ICD-10-CM

## 2015-12-12 DIAGNOSIS — I5022 Chronic systolic (congestive) heart failure: Secondary | ICD-10-CM | POA: Diagnosis not present

## 2015-12-12 MED ORDER — FLUCONAZOLE 100 MG PO TABS: 100.0000 mg | ORAL_TABLET | Freq: Every day | ORAL | Status: AC

## 2015-12-12 MED ORDER — OXYCODONE HCL 5 MG PO TABS: 5.0000 mg | ORAL_TABLET | Freq: Four times a day (QID) | ORAL | Status: DC | PRN

## 2015-12-12 NOTE — Progress Notes (Signed)
Subjective:  Patient ID: Cody Hale, male    DOB: Nov 12, 1950  Age: 65 y.o. MRN: [id number]  CC: Rash   HPI Cody Hale presents for a several week history of a red itchy rash around his buttocks and his groin. He has not treated this.  He also complains of persistent pain in his right knee and some other joints as well. He has tried Norco for symptom relief but says it doesn't help much. The pain interferes with his sleep and his ability to be active during the day.  Outpatient Prescriptions Prior to Visit  Medication Sig Dispense Refill  . Blood Glucose Monitoring Suppl (ONETOUCH VERIO IQ SYSTEM) W/DEVICE KIT 1 Act by Does not apply route 3 (three) times daily. 2 kit 0  . carvedilol (COREG) 6.25 MG tablet TAKE 1 TABLET BY MOUTH TWICE DAILY. 60 tablet 11  . clonazePAM (KLONOPIN) 1 MG tablet Take 1 tablet (1 mg total) by mouth 2 (two) times daily as needed for anxiety. 60 tablet 5  . glucose blood (ACCU-CHEK AVIVA PLUS) test strip Use to check blood sugars three times a day Dx e11.9 100 each 5  . Insulin Glargine (LANTUS SOLOSTAR) 100 UNIT/ML Solostar Pen Inject 30 Units into the skin daily at 10 pm. 15 mL 11  . Lancets (ONETOUCH ULTRASOFT) lancets Use to help check blood sugars twice a day E11.8 100 each 3  . metFORMIN (GLUCOPHAGE-XR) 750 MG 24 hr tablet Take 2 tablets (1,500 mg total) by mouth daily with breakfast. 180 tablet 1  . Multiple Vitamin (DAILY VITE) TABS TAKE 1 TABLET BY MOUTH ONCE DAILY. 30 tablet 11  . nitroGLYCERIN (NITROSTAT) 0.4 MG SL tablet Place 1 tablet (0.4 mg total) under the tongue every 5 (five) minutes x 3 doses as needed for chest pain. 25 tablet 3  . potassium chloride SA (K-DUR,KLOR-CON) 10 MEQ tablet Take 1 tablet (10 mEq total) by mouth daily. 90 tablet 3  . simvastatin (ZOCOR) 20 MG tablet TAKE 1 TABLET BY MOUTH ONCE DAILY. 30 tablet 11  . torsemide (DEMADEX) 20 MG tablet Take 1 tablet (20 mg total) by mouth daily. 90 tablet 1  . valsartan (DIOVAN)  40 MG tablet Take 1 tablet (40 mg total) by mouth 2 (two) times daily. 180 tablet 3  . Vilazodone HCl (VIIBRYD) 40 MG TABS Take 1 tablet (40 mg total) by mouth daily. 30 tablet 11  . XARELTO 20 MG TABS tablet TAKE 1 TABLET BY MOUTH ONCE DAILY. 30 tablet 5  . HYDROcodone-acetaminophen (NORCO) 10-325 MG tablet Take 1 tablet by mouth every 8 (eight) hours as needed. 75 tablet 0   No facility-administered medications prior to visit.    ROS Review of Systems  Constitutional: Negative.  Negative for fever, chills, diaphoresis, appetite change and fatigue.  HENT: Negative.   Eyes: Negative.   Respiratory: Negative.  Negative for cough, choking, chest tightness, shortness of breath and stridor.   Cardiovascular: Negative.  Negative for chest pain, palpitations and leg swelling.  Gastrointestinal: Negative.  Negative for nausea, vomiting, abdominal pain, diarrhea and constipation.  Endocrine: Negative.   Genitourinary: Negative.   Musculoskeletal: Positive for arthralgias. Negative for myalgias, back pain and neck pain.  Skin: Positive for color change and rash.  Allergic/Immunologic: Negative.   Neurological: Negative.  Negative for dizziness, weakness, light-headedness, numbness and headaches.  Hematological: Negative.   Psychiatric/Behavioral: Negative.     Objective:  BP 130/70 mmHg  Pulse 75  Temp(Src) 98.6 F (37 C) (Oral)  Resp 16  Ht 5' 10"  (1.778 m)  Wt 286 lb (129.729 kg)  BMI 41.04 kg/m2  SpO2 95%  BP Readings from Last 3 Encounters:  12/12/15 130/70  11/09/15 122/60  10/12/15 128/80    Wt Readings from Last 3 Encounters:  12/12/15 286 lb (129.729 kg)  11/09/15 275 lb (124.739 kg)  10/12/15 277 lb (125.646 kg)    Physical Exam  Constitutional: He is oriented to person, place, and time. No distress.  HENT:  Mouth/Throat: Oropharynx is clear and moist. No oropharyngeal exudate.  Eyes: Conjunctivae are normal. Right eye exhibits no discharge. Left eye exhibits no  discharge. No scleral icterus.  Neck: Normal range of motion. Neck supple. No JVD present. No tracheal deviation present. No thyromegaly present.  Cardiovascular: Normal rate, normal heart sounds and intact distal pulses.  An irregularly irregular rhythm present. Exam reveals no gallop and no friction rub.   No murmur heard. Pulmonary/Chest: Effort normal and breath sounds normal. No stridor. No respiratory distress. He has no wheezes. He has no rales. He exhibits no tenderness.  Abdominal: Soft. Bowel sounds are normal. He exhibits no distension and no mass. There is no tenderness. There is no rebound and no guarding.  Musculoskeletal: Normal range of motion. He exhibits no edema.       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 He exhibits normal range of motion, no swelling, no effusion, no ecchymosis and no erythema. No tenderness found.       Left knee: He exhibits deformity (DJD). He exhibits normal range of motion, no swelling, no effusion and no erythema. No tenderness found.  Lymphadenopathy:    He has no cervical adenopathy.  Neurological: He is oriented to person, place, and time.  Skin: Skin is warm and dry. Rash noted. No lesion, no petechiae and no purpura noted. Rash is not macular, not papular, not maculopapular, not nodular, not pustular, not vesicular and not urticarial. He is not diaphoretic. There is erythema. No cyanosis. No pallor. Nails show no clubbing.     Confluent throughout the groin, symmetrically and bilaterally, and around the. Anal region there is a wet, pinkish, confluent rash with no scale, streaking, vesicles, pustules, or exudate.  Psychiatric: He has a normal mood and affect. His behavior is normal. Judgment and thought content normal.  Vitals reviewed.   Lab Results  Component Value Date   WBC 3.5* 10/12/2015   HGB 13.7 10/12/2015   HCT 41.4 10/12/2015   PLT 103.0 R* 10/12/2015   GLUCOSE 138* 11/09/2015   CHOL 94 04/25/2015   TRIG 74.0 04/25/2015   HDL  45.30 04/25/2015   LDLDIRECT 84.2 01/31/2014   LDLCALC 34 04/25/2015   ALT 22 10/12/2015   AST 19 10/12/2015   NA 141 11/09/2015   K 5.0 11/09/2015   CL 101 11/09/2015   CREATININE 0.92 11/09/2015   BUN 21 11/09/2015   CO2 32 11/09/2015   TSH 0.90 10/12/2015   PSA 0.36 05/30/2014   INR 1.13 11/30/2012   HGBA1C 7.5* 11/09/2015   MICROALBUR 0.7 11/22/2014    No results found.  Assessment & Plan:   Trebor was seen today for rash.  Diagnoses and all orders for this visit:  Candida rash of groin -     fluconazole (DIFLUCAN) 100 MG tablet; Take 1 tablet (100 mg total) by mouth daily.  Primary osteoarthritis involving multiple joints -     Discontinue: oxyCODONE (OXY IR/ROXICODONE) 5 MG immediate release tablet; Take 1 tablet (5 mg total) by  mouth every 6 (six) hours as needed for severe pain. -     Discontinue: oxyCODONE (OXY IR/ROXICODONE) 5 MG immediate release tablet; Take 1 tablet (5 mg total) by mouth every 6 (six) hours as needed for severe pain. -     oxyCODONE (OXY IR/ROXICODONE) 5 MG immediate release tablet; Take 1 tablet (5 mg total) by mouth every 6 (six) hours as needed for severe pain. -     DME Hospital bed  Chronic systolic heart failure ([place])- he has a normal fluid status today, will continue the current regimen carvedilol, Diovan, and Demadex -     DME Hospital bed   I have discontinued Mr. Strahm HYDROcodone-acetaminophen. I am also having him start on fluconazole. Additionally, I am having him maintain his nitroGLYCERIN, ONETOUCH VERIO IQ SYSTEM, onetouch ultrasoft, potassium chloride, Vilazodone HCl, DAILY VITE, XARELTO, carvedilol, simvastatin, glucose blood, valsartan, torsemide, clonazePAM, metFORMIN, Insulin Glargine, NOVOFINE AUTOCOVER, potassium chloride, and oxyCODONE.  Meds ordered this encounter  Medications  . NOVOFINE AUTOCOVER 30G X 8 MM MISC    Sig:   . potassium chloride (K-DUR) 10 MEQ tablet    Sig:   . fluconazole (DIFLUCAN) 100 MG tablet     Sig: Take 1 tablet (100 mg total) by mouth daily.    Dispense:  14 tablet    Refill:  1  . DISCONTD: oxyCODONE (OXY IR/ROXICODONE) 5 MG immediate release tablet    Sig: Take 1 tablet (5 mg total) by mouth every 6 (six) hours as needed for severe pain.    Dispense:  75 tablet    Refill:  0    Fill on or after 12/12/15  . DISCONTD: oxyCODONE (OXY IR/ROXICODONE) 5 MG immediate release tablet    Sig: Take 1 tablet (5 mg total) by mouth every 6 (six) hours as needed for severe pain.    Dispense:  75 tablet    Refill:  0    Fill on or after 01/11/16  . oxyCODONE (OXY IR/ROXICODONE) 5 MG immediate release tablet    Sig: Take 1 tablet (5 mg total) by mouth every 6 (six) hours as needed for severe pain.    Dispense:  75 tablet    Refill:  0    Fill on or after 02/11/16     Follow-up: Return in about 3 months (around 03/13/2016).  Scarlette Calico, MD

## 2015-12-12 NOTE — Patient Instructions (Signed)
Osteoarthritis Osteoarthritis is a disease that causes soreness and inflammation of a joint. It occurs when the cartilage at the affected joint wears down. Cartilage acts as a cushion, covering the ends of bones where they meet to form a joint. Osteoarthritis is the most common form of arthritis. It often occurs in older people. The joints affected most often by this condition include those in the:  Ends of the fingers.  Thumbs.  Neck.  Lower back.  Knees.  Hips. CAUSES  Over time, the cartilage that covers the ends of bones begins to wear away. This causes bone to rub on bone, producing pain and stiffness in the affected joints.  RISK FACTORS Certain factors can increase your chances of having osteoarthritis, including:  Older age.  Excessive body weight.  Overuse of joints.  Previous joint injury. SIGNS AND SYMPTOMS   Pain, swelling, and stiffness in the joint.  Over time, the joint may lose its normal shape.  Small deposits of bone (osteophytes) may grow on the edges of the joint.  Bits of bone or cartilage can break off and float inside the joint space. This may cause more pain and damage. DIAGNOSIS  Your health care provider will do a physical exam and ask about your symptoms. Various tests may be ordered, such as:  X-rays of the affected joint.  Blood tests to rule out other types of arthritis. Additional tests may be used to diagnose your condition. TREATMENT  Goals of treatment are to control pain and improve joint function. Treatment plans may include:  A prescribed exercise program that allows for rest and joint relief.  A weight control plan.  Pain relief techniques, such as:  Properly applied heat and cold.  Electric pulses delivered to nerve endings under the skin (transcutaneous electrical nerve stimulation [TENS]).  Massage.  Certain nutritional supplements.  Medicines to control pain, such as:  Acetaminophen.  Nonsteroidal  anti-inflammatory drugs (NSAIDs), such as naproxen.  Narcotic or central-acting agents, such as tramadol.  Corticosteroids. These can be given orally or as an injection.  Surgery to reposition the bones and relieve pain (osteotomy) or to remove loose pieces of bone and cartilage. Joint replacement may be needed in advanced states of osteoarthritis. HOME CARE INSTRUCTIONS   Take medicines only as directed by your health care provider.  Maintain a healthy weight. Follow your health care provider's instructions for weight control. This may include dietary instructions.  Exercise as directed. Your health care provider can recommend specific types of exercise. These may include:  Strengthening exercises. These are done to strengthen the muscles that support joints affected by arthritis. They can be performed with weights or with exercise bands to add resistance.  Aerobic activities. These are exercises, such as brisk walking or low-impact aerobics, that get your heart pumping.  Range-of-motion activities. These keep your joints limber.  Balance and agility exercises. These help you maintain daily living skills.  Rest your affected joints as directed by your health care provider.  Keep all follow-up visits as directed by your health care provider. SEEK MEDICAL CARE IF:   Your skin turns red.  You develop a rash in addition to your joint pain.  You have worsening joint pain.  You have a fever along with joint or muscle aches. SEEK IMMEDIATE MEDICAL CARE IF:  You have a significant loss of weight or appetite.  You have night sweats. FOR MORE INFORMATION   National Institute of Arthritis and Musculoskeletal and Skin Diseases: www.niams.nih.gov  National Institute on   Aging: www.nia.nih.gov  American College of Rheumatology: www.rheumatology.org   This information is not intended to replace advice given to you by your health care provider. Make sure you discuss any questions you  have with your health care provider.   Document Released: 06/24/2005 Document Revised: 07/15/2014 Document Reviewed: 03/01/2013 Elsevier Interactive Patient Education 2016 [employer]  

## 2015-12-12 NOTE — Progress Notes (Signed)
Pre visit review using our clinic review tool, if applicable. No additional management support is needed unless otherwise documented below in the visit note. 

## 2016-01-17 ENCOUNTER — Ambulatory Visit: Payer: Self-pay | Admitting: Internal Medicine

## 2016-01-25 ENCOUNTER — Ambulatory Visit (INDEPENDENT_AMBULATORY_CARE_PROVIDER_SITE_OTHER): Payer: Medicare Other | Admitting: Internal Medicine

## 2016-01-25 ENCOUNTER — Encounter: Payer: Self-pay | Admitting: Internal Medicine

## 2016-01-25 VITALS — BP 118/66 | HR 76 | Temp 98.1°F | Resp 16 | Ht 70.0 in | Wt 291.1 lb

## 2016-01-25 DIAGNOSIS — E118 Type 2 diabetes mellitus with unspecified complications: Secondary | ICD-10-CM

## 2016-01-25 DIAGNOSIS — M170 Bilateral primary osteoarthritis of knee: Secondary | ICD-10-CM

## 2016-01-25 NOTE — Progress Notes (Signed)
Subjective:  Patient ID: Cody Hale, male    DOB: 03-Jul-1951  Age: 65 y.o. MRN: [id number]  CC: Hypertension; Diabetes; and Osteoarthritis   HPI Cody Hale presents for follow-up on the above medical problems. His major complaint today is worsening arthritis pain in his right knee and right hip. He wants to see an orthopedic surgeon to see if surgery is an option for him. He is currently getting pain relief with an opioid pain medication.  Outpatient Prescriptions Prior to Visit  Medication Sig Dispense Refill  . Blood Glucose Monitoring Suppl (ONETOUCH VERIO IQ SYSTEM) W/DEVICE KIT 1 Act by Does not apply route 3 (three) times daily. 2 kit 0  . carvedilol (COREG) 6.25 MG tablet TAKE 1 TABLET BY MOUTH TWICE DAILY. 60 tablet 11  . clonazePAM (KLONOPIN) 1 MG tablet Take 1 tablet (1 mg total) by mouth 2 (two) times daily as needed for anxiety. 60 tablet 5  . glucose blood (ACCU-CHEK AVIVA PLUS) test strip Use to check blood sugars three times a day Dx e11.9 100 each 5  . Insulin Glargine (LANTUS SOLOSTAR) 100 UNIT/ML Solostar Pen Inject 30 Units into the skin daily at 10 pm. 15 mL 11  . Lancets (ONETOUCH ULTRASOFT) lancets Use to help check blood sugars twice a day E11.8 100 each 3  . metFORMIN (GLUCOPHAGE-XR) 750 MG 24 hr tablet Take 2 tablets (1,500 mg total) by mouth daily with breakfast. 180 tablet 1  . Multiple Vitamin (DAILY VITE) TABS TAKE 1 TABLET BY MOUTH ONCE DAILY. 30 tablet 11  . nitroGLYCERIN (NITROSTAT) 0.4 MG SL tablet Place 1 tablet (0.4 mg total) under the tongue every 5 (five) minutes x 3 doses as needed for chest pain. 25 tablet 3  . NOVOFINE AUTOCOVER 30G X 8 MM MISC     . oxyCODONE (OXY IR/ROXICODONE) 5 MG immediate release tablet Take 1 tablet (5 mg total) by mouth every 6 (six) hours as needed for severe pain. 75 tablet 0  . potassium chloride (K-DUR) 10 MEQ tablet     . potassium chloride SA (K-DUR,KLOR-CON) 10 MEQ tablet Take 1 tablet (10 mEq total) by mouth  daily. 90 tablet 3  . simvastatin (ZOCOR) 20 MG tablet TAKE 1 TABLET BY MOUTH ONCE DAILY. 30 tablet 11  . torsemide (DEMADEX) 20 MG tablet Take 1 tablet (20 mg total) by mouth daily. 90 tablet 1  . valsartan (DIOVAN) 40 MG tablet Take 1 tablet (40 mg total) by mouth 2 (two) times daily. 180 tablet 3  . Vilazodone HCl (VIIBRYD) 40 MG TABS Take 1 tablet (40 mg total) by mouth daily. 30 tablet 11  . XARELTO 20 MG TABS tablet TAKE 1 TABLET BY MOUTH ONCE DAILY. 30 tablet 5   No facility-administered medications prior to visit.    ROS Review of Systems  Constitutional: Negative.  Negative for fever, chills and fatigue.  HENT: Negative.   Eyes: Negative.   Respiratory: Negative.  Negative for cough, chest tightness, shortness of breath, wheezing and stridor.   Cardiovascular: Negative.  Negative for chest pain, palpitations and leg swelling.  Gastrointestinal: Negative.  Negative for nausea, vomiting, abdominal pain, diarrhea and constipation.  Endocrine: Negative.   Genitourinary: Negative.   Musculoskeletal: Positive for arthralgias. Negative for myalgias, back pain and neck pain.  Skin: Negative.   Allergic/Immunologic: Negative.   Neurological: Negative.  Negative for dizziness, tremors, syncope, light-headedness and headaches.  Hematological: Negative.  Negative for adenopathy. Does not bruise/bleed easily.  Psychiatric/Behavioral: Negative.  Objective:  BP 118/66 mmHg  Pulse 76  Temp(Src) 98.1 F (36.7 C) (Oral)  Resp 16  Ht '5\' 10"'$  (1.778 m)  Wt 291 lb 2 oz (132.053 kg)  BMI 41.77 kg/m2  SpO2 95%  BP Readings from Last 3 Encounters:  01/25/16 118/66  12/12/15 130/70  11/09/15 122/60    Wt Readings from Last 3 Encounters:  01/25/16 291 lb 2 oz (132.053 kg)  12/12/15 286 lb (129.729 kg)  11/09/15 275 lb (124.739 kg)    Physical Exam  Constitutional: He is oriented to person, place, and time. No distress.  HENT:  Mouth/Throat: Oropharynx is clear and moist. No  oropharyngeal exudate.  Eyes: Conjunctivae are normal. Right eye exhibits no discharge. Left eye exhibits no discharge. No scleral icterus.  Neck: Normal range of motion. Neck supple. No JVD present. No tracheal deviation present. No thyromegaly present.  Cardiovascular: Normal rate, regular rhythm, normal heart sounds and intact distal pulses.  Exam reveals no gallop and no friction rub.   No murmur heard. Pulmonary/Chest: Effort normal and breath sounds normal. No stridor. No respiratory distress. He has no wheezes. He has no rales. He exhibits no tenderness.  Abdominal: Soft. Bowel sounds are normal. He exhibits no distension and no mass. There is no tenderness. There is no rebound and no guarding.  Musculoskeletal: Normal range of motion. He exhibits edema (tr pitting edema in BLE). He exhibits no tenderness.       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 He exhibits normal range of motion, no swelling, no effusion and no erythema. No tenderness found.  Lymphadenopathy:    He has no cervical adenopathy.  Neurological: He is oriented to person, place, and time.  Skin: Skin is warm and dry. No rash noted. He is not diaphoretic. No erythema. No pallor.  Vitals reviewed.   Lab Results  Component Value Date   WBC 3.5* 10/12/2015   HGB 13.7 10/12/2015   HCT 41.4 10/12/2015   PLT 103.0 R* 10/12/2015   GLUCOSE 138* 11/09/2015   CHOL 94 04/25/2015   TRIG 74.0 04/25/2015   HDL 45.30 04/25/2015   LDLDIRECT 84.2 01/31/2014   LDLCALC 34 04/25/2015   ALT 22 10/12/2015   AST 19 10/12/2015   NA 141 11/09/2015   K 5.0 11/09/2015   CL 101 11/09/2015   CREATININE 0.92 11/09/2015   BUN 21 11/09/2015   CO2 32 11/09/2015   TSH 0.90 10/12/2015   PSA 0.36 05/30/2014   INR 1.13 11/30/2012   HGBA1C 7.5* 11/09/2015   MICROALBUR 0.7 11/22/2014    No results found.  Assessment & Plan:   Cody Hale was seen today for hypertension, diabetes and osteoarthritis.  Diagnoses and all orders for this  visit:  Type 2 diabetes mellitus with complication, without long-term current use of insulin ([place])- his recent A1c was 7.5%, his blood sugars are adequately well controlled.  Primary osteoarthritis of both knees- I will continue oxycodone as needed for pain, he will see orthopedic surgery for an opinion about surgical options. -     Ambulatory referral to Orthopedic Surgery   I am having Cody Hale maintain his nitroGLYCERIN, ONETOUCH VERIO IQ SYSTEM, onetouch ultrasoft, potassium chloride, Vilazodone HCl, DAILY VITE, XARELTO, carvedilol, simvastatin, glucose blood, valsartan, torsemide, clonazePAM, metFORMIN, Insulin Glargine, NOVOFINE AUTOCOVER, potassium chloride, oxyCODONE, and HYDROcodone-acetaminophen.  Meds ordered this encounter  Medications  . HYDROcodone-acetaminophen (NORCO) 10-325 MG tablet    Sig:      Follow-up: Return in about 4 months (around 05/27/2016).  Scarlette Calico, MD

## 2016-01-25 NOTE — Progress Notes (Signed)
Pre visit review using our clinic review tool, if applicable. No additional management support is needed unless otherwise documented below in the visit note. 

## 2016-01-25 NOTE — Patient Instructions (Signed)
Type 2 Diabetes Mellitus, Adult °Type 2 diabetes mellitus, often simply referred to as type 2 diabetes, is a long-lasting (chronic) disease. In type 2 diabetes, the pancreas does not make enough insulin (a hormone), the cells are less responsive to the insulin that is made (insulin resistance), or both. Normally, insulin moves sugars from food into the tissue cells. The tissue cells use the sugars for energy. The lack of insulin or the lack of normal response to insulin causes excess sugars to build up in the blood instead of going into the tissue cells. As a result, high blood sugar (hyperglycemia) develops. The effect of high sugar (glucose) levels can cause many complications.  °Type 2 diabetes was also previously called adult-onset diabetes, but it can occur at any age.    °RISK FACTORS  °A person is predisposed to developing type 2 diabetes if someone in the family has the disease and also has one or more of the following primary risk factors: °· Weight gain, or being overweight or obese. °· An inactive lifestyle. °· A history of consistently eating high-calorie foods. °Maintaining a normal weight and regular physical activity can reduce the chance of developing type 2 diabetes. °SYMPTOMS  °A person with type 2 diabetes may not show symptoms initially. The symptoms of type 2 diabetes appear slowly. The symptoms include: °· Increased thirst (polydipsia). °· Increased urination (polyuria). °· Increased urination during the night (nocturia). °· Sudden or unexplained weight changes. °· Frequent, recurring infections. °· Tiredness (fatigue). °· Weakness. °· Vision changes, such as blurred vision. °· Fruity smell to your breath. °· Abdominal pain. °· Nausea or vomiting. °· Cuts or bruises which are slow to heal. °· Tingling or numbness in the hands or feet. °· An open skin wound (ulcer). °DIAGNOSIS °Type 2 diabetes is frequently not diagnosed until complications of diabetes are present. Type 2 diabetes is diagnosed  when symptoms or complications are present and when blood glucose levels are increased. Your blood glucose level may be checked by one or more of the following blood tests: °· A fasting blood glucose test. You will not be allowed to eat for at least 8 hours before a blood sample is taken. °· A random blood glucose test. Your blood glucose is checked at any time of the day regardless of when you ate. °· A hemoglobin A1c blood glucose test. A hemoglobin A1c test provides information about blood glucose control over the previous 3 months. °· An oral glucose tolerance test (OGTT). Your blood glucose is measured after you have not eaten (fasted) for 2 hours and then after you drink a glucose-containing beverage. °TREATMENT  °· You may need to take insulin or diabetes medicine daily to keep blood glucose levels in the desired range. °· If you use insulin, you may need to adjust the dosage depending on the carbohydrates that you eat with each meal or snack. °· Lifestyle changes are recommended as part of your treatment. These may include: °¨ Following an individualized diet plan developed by a nutritionist or dietitian. °¨ Exercising daily. °Your health care providers will set individualized treatment goals for you based on your age, your medicines, how long you have had diabetes, and any other medical conditions you have. Generally, the goal of treatment is to maintain the following blood glucose levels: °· Before meals (preprandial): 80-130 mg/dL. °· After meals (postprandial): below 180 mg/dL. °· A1c: less than 6.5-7%. °HOME CARE INSTRUCTIONS  °· Have your hemoglobin A1c level checked twice a year. °· Perform daily blood glucose monitoring   as directed by your health care provider. °· Monitor urine ketones when you are ill and as directed by your health care provider. °· Take your diabetes medicine or insulin as directed by your health care provider to maintain your blood glucose levels in the desired range. °¨ Never run  out of diabetes medicine or insulin. It is needed every day. °¨ If you are using insulin, you may need to adjust the amount of insulin given based on your intake of carbohydrates. Carbohydrates can raise blood glucose levels but need to be included in your diet. Carbohydrates provide vitamins, minerals, and fiber which are an essential part of a healthy diet. Carbohydrates are found in fruits, vegetables, whole grains, dairy products, legumes, and foods containing added sugars. °· Eat healthy foods. You should make an appointment to see a registered dietitian to help you create an eating plan that is right for you. °· Lose weight if you are overweight. °· Carry a medical alert card or wear your medical alert jewelry. °· Carry a 15-gram carbohydrate snack with you at all times to treat low blood glucose (hypoglycemia). Some examples of 15-gram carbohydrate snacks include: °¨ Glucose tablets, 3 or 4. °¨ Glucose gel, 15-gram tube. °¨ Raisins, 2 tablespoons (24 grams). °¨ Jelly beans, 6. °¨ Animal crackers, 8. °¨ Regular pop, 4 ounces (120 mL). °¨ Gummy treats, 9. °· Recognize hypoglycemia. Hypoglycemia occurs with blood glucose levels of 70 mg/dL and below. The risk for hypoglycemia increases when fasting or skipping meals, during or after intense exercise, and during sleep. Hypoglycemia symptoms can include: °¨ Tremors or shakes. °¨ Decreased ability to concentrate. °¨ Sweating. °¨ Increased heart rate. °¨ Headache. °¨ Dry mouth. °¨ Hunger. °¨ Irritability. °¨ Anxiety. °¨ Restless sleep. °¨ Altered speech or coordination. °¨ Confusion. °· Treat hypoglycemia promptly. If you are alert and able to safely swallow, follow the 15:15 rule: °¨ Take 15-20 grams of rapid-acting glucose or carbohydrate. Rapid-acting options include glucose gel, glucose tablets, or 4 ounces (120 mL) of fruit juice, regular soda, or low-fat milk. °¨ Check your blood glucose level 15 minutes after taking the glucose. °¨ Take 15-20 grams more of  glucose if the repeat blood glucose level is still 70 mg/dL or below. °¨ Eat a meal or snack within 1 hour once blood glucose levels return to normal. °· Be alert to feeling very thirsty and urinating more frequently than usual, which are early signs of hyperglycemia. An early awareness of hyperglycemia allows for prompt treatment. Treat hyperglycemia as directed by your health care provider. °· Engage in at least 150 minutes of moderate-intensity physical activity a week, spread over at least 3 days of the week or as directed by your health care provider. In addition, you should engage in resistance exercise at least 2 times a week or as directed by your health care provider. Try to spend no more than 90 minutes at one time inactive. °· Adjust your medicine and food intake as needed if you start a new exercise or sport. °· Follow your sick-day plan anytime you are unable to eat or drink as usual. °· Do not use any tobacco products including cigarettes, chewing tobacco, or electronic cigarettes. If you need help quitting, ask your health care provider. °· Limit alcohol intake to no more than 1 drink per day for nonpregnant women and 2 drinks per day for men. You should drink alcohol only when you are also eating food. Talk with your health care provider whether alcohol is safe   for you. Tell your health care provider if you drink alcohol several times a week. °· Keep all follow-up visits as directed by your health care provider. This is important. °· Schedule an eye exam soon after the diagnosis of type 2 diabetes and then annually. °· Perform daily skin and foot care. Examine your skin and feet daily for cuts, bruises, redness, nail problems, bleeding, blisters, or sores. A foot exam by a health care provider should be done annually. °· Brush your teeth and gums at least twice a day and floss at least once a day. Follow up with your dentist regularly. °· Share your diabetes management plan with your workplace or  school. °· Keep your immunizations up to date. It is recommended that you receive a flu (influenza) vaccine every year. It is also recommended that you receive a pneumonia (pneumococcal) vaccine. If you are 65 years of age or older and have never received a pneumonia vaccine, this vaccine may be given as a series of two separate shots. Ask your health care provider which additional vaccines may be recommended. °· Learn to manage stress. °· Obtain ongoing diabetes education and support as needed. °· Participate in or seek rehabilitation as needed to maintain or improve independence and quality of life. Request a physical or occupational therapy referral if you are having foot or hand numbness, or difficulties with grooming, dressing, eating, or physical activity. °SEEK MEDICAL CARE IF:  °· You are unable to eat food or drink fluids for more than 6 hours. °· You have nausea and vomiting for more than 6 hours. °· Your blood glucose level is over 240 mg/dL. °· There is a change in mental status. °· You develop an additional serious illness. °· You have diarrhea for more than 6 hours. °· You have been sick or have had a fever for a couple of days and are not getting better. °· You have pain during any physical activity.   °SEEK IMMEDIATE MEDICAL CARE IF: °· You have difficulty breathing. °· You have moderate to large ketone levels. °  °This information is not intended to replace advice given to you by your health care provider. Make sure you discuss any questions you have with your health care provider. °  °Document Released: 06/24/2005 Document Revised: 03/15/2015 Document Reviewed: 01/21/2012 °Elsevier Interactive Patient Education ©2016 [employer] ° °

## 2016-01-31 DIAGNOSIS — B351 Tinea unguium: Secondary | ICD-10-CM | POA: Diagnosis not present

## 2016-01-31 DIAGNOSIS — M79674 Pain in right toe(s): Secondary | ICD-10-CM | POA: Diagnosis not present

## 2016-01-31 DIAGNOSIS — M79675 Pain in left toe(s): Secondary | ICD-10-CM | POA: Diagnosis not present

## 2016-02-21 ENCOUNTER — Telehealth: Payer: Self-pay | Admitting: *Deleted

## 2016-02-21 ENCOUNTER — Other Ambulatory Visit: Payer: Self-pay | Admitting: Internal Medicine

## 2016-02-21 MED ORDER — AMOXICILLIN-POT CLAVULANATE 875-125 MG PO TABS: 1.0000 | ORAL_TABLET | Freq: Two times a day (BID) | ORAL | 0 refills | Status: AC

## 2016-02-21 NOTE — Telephone Encounter (Signed)
Left msg on triage stating pt has cellulitis in (R) leg its warm to the touch, and have a lot of fluid. Wanting md to rx antibiotic...Raechel Chute

## 2016-02-21 NOTE — Telephone Encounter (Signed)
RX sent

## 2016-02-21 NOTE — Telephone Encounter (Signed)
Notified Dondra Spry Md sent antibiotic to [facility]...Raechel Chute

## 2016-03-05 ENCOUNTER — Ambulatory Visit (INDEPENDENT_AMBULATORY_CARE_PROVIDER_SITE_OTHER): Payer: Medicare Other

## 2016-03-05 ENCOUNTER — Encounter: Payer: Self-pay | Admitting: Orthopaedic Surgery

## 2016-03-05 ENCOUNTER — Ambulatory Visit (INDEPENDENT_AMBULATORY_CARE_PROVIDER_SITE_OTHER): Payer: Medicare Other | Admitting: Orthopaedic Surgery

## 2016-03-05 VITALS — BP 155/81 | HR 59 | Temp 97.3°F | Ht 66.5 in | Wt 300.0 lb

## 2016-03-05 DIAGNOSIS — M25561 Pain in right knee: Secondary | ICD-10-CM

## 2016-03-05 DIAGNOSIS — M25551 Pain in right hip: Secondary | ICD-10-CM | POA: Diagnosis not present
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
## 2016-03-13 ENCOUNTER — Encounter: Payer: Self-pay | Admitting: Internal Medicine

## 2016-03-13 ENCOUNTER — Ambulatory Visit (INDEPENDENT_AMBULATORY_CARE_PROVIDER_SITE_OTHER): Payer: Medicare Other | Admitting: Internal Medicine

## 2016-03-13 ENCOUNTER — Other Ambulatory Visit (INDEPENDENT_AMBULATORY_CARE_PROVIDER_SITE_OTHER): Payer: Medicare Other

## 2016-03-13 VITALS — BP 130/60 | HR 82 | Temp 98.2°F | Resp 16 | Ht 66.5 in | Wt 298.0 lb

## 2016-03-13 DIAGNOSIS — E785 Hyperlipidemia, unspecified: Secondary | ICD-10-CM | POA: Diagnosis not present

## 2016-03-13 DIAGNOSIS — Z23 Encounter for immunization: Secondary | ICD-10-CM | POA: Diagnosis not present

## 2016-03-13 DIAGNOSIS — E118 Type 2 diabetes mellitus with unspecified complications: Secondary | ICD-10-CM

## 2016-03-13 DIAGNOSIS — M150 Primary generalized (osteo)arthritis: Secondary | ICD-10-CM

## 2016-03-13 DIAGNOSIS — M7989 Other specified soft tissue disorders: Secondary | ICD-10-CM | POA: Diagnosis not present

## 2016-03-13 DIAGNOSIS — I10 Essential (primary) hypertension: Secondary | ICD-10-CM

## 2016-03-13 DIAGNOSIS — J301 Allergic rhinitis due to pollen: Secondary | ICD-10-CM

## 2016-03-13 DIAGNOSIS — M8949 Other hypertrophic osteoarthropathy, multiple sites: Secondary | ICD-10-CM

## 2016-03-13 DIAGNOSIS — Z794 Long term (current) use of insulin: Secondary | ICD-10-CM | POA: Diagnosis not present

## 2016-03-13 DIAGNOSIS — M159 Polyosteoarthritis, unspecified: Secondary | ICD-10-CM

## 2016-03-13 LAB — BASIC METABOLIC PANEL
BUN: 23 mg/dL (ref 6–23)
CO2: 33 mEq/L — ABNORMAL HIGH (ref 19–32)
Calcium: 9.6 mg/dL (ref 8.4–10.5)
Chloride: 104 mEq/L (ref 96–112)
Creatinine, Ser: 0.95 mg/dL (ref 0.40–1.50)
GFR: 84.47 mL/min (ref >60.00)
Glucose, Bld: 143 mg/dL — ABNORMAL HIGH (ref 70–99)
Potassium: 4.7 mEq/L (ref 3.5–5.1)
Sodium: 142 mEq/L (ref 135–145)

## 2016-03-13 LAB — LIPID PANEL
Cholesterol: 102 mg/dL (ref 0–200)
HDL: 46.8 mg/dL (ref >39.00)
LDL Cholesterol: 34 mg/dL (ref 0–99)
NonHDL: 55.3
Total CHOL/HDL Ratio: 2
Triglycerides: 108 mg/dL (ref 0.0–149.0)
VLDL: 21.6 mg/dL (ref 0.0–40.0)

## 2016-03-13 LAB — HEMOGLOBIN A1C: Hgb A1c MFr Bld: 7.5 % — ABNORMAL HIGH (ref 4.6–6.5)

## 2016-03-13 MED ORDER — OXYCODONE HCL 5 MG PO TABS: 5.0000 mg | ORAL_TABLET | Freq: Four times a day (QID) | ORAL | 0 refills | Status: DC | PRN

## 2016-03-13 MED ORDER — OXYCODONE HCL 5 MG PO TABS
5.0000 mg | ORAL_TABLET | Freq: Four times a day (QID) | ORAL | 0 refills | Status: DC | PRN
Start: 2016-03-13 — End: 2016-03-13

## 2016-03-13 MED ORDER — FLUTICASONE PROPIONATE 50 MCG/ACT NA SUSP: 2.0000 | Freq: Every day | NASAL | 11 refills | Status: DC

## 2016-03-13 MED ORDER — AZELASTINE HCL 0.1 % NA SOLN: 1.0000 | Freq: Two times a day (BID) | NASAL | 12 refills | Status: DC

## 2016-03-13 NOTE — Patient Instructions (Signed)
Type 2 Diabetes Mellitus, Adult °Type 2 diabetes mellitus, often simply referred to as type 2 diabetes, is a long-lasting (chronic) disease. In type 2 diabetes, the pancreas does not make enough insulin (a hormone), the cells are less responsive to the insulin that is made (insulin resistance), or both. Normally, insulin moves sugars from food into the tissue cells. The tissue cells use the sugars for energy. The lack of insulin or the lack of normal response to insulin causes excess sugars to build up in the blood instead of going into the tissue cells. As a result, high blood sugar (hyperglycemia) develops. The effect of high sugar (glucose) levels can cause many complications.  °Type 2 diabetes was also previously called adult-onset diabetes, but it can occur at any age.    °RISK FACTORS  °A person is predisposed to developing type 2 diabetes if someone in the family has the disease and also has one or more of the following primary risk factors: °· Weight gain, or being overweight or obese. °· An inactive lifestyle. °· A history of consistently eating high-calorie foods. °Maintaining a normal weight and regular physical activity can reduce the chance of developing type 2 diabetes. °SYMPTOMS  °A person with type 2 diabetes may not show symptoms initially. The symptoms of type 2 diabetes appear slowly. The symptoms include: °· Increased thirst (polydipsia). °· Increased urination (polyuria). °· Increased urination during the night (nocturia). °· Sudden or unexplained weight changes. °· Frequent, recurring infections. °· Tiredness (fatigue). °· Weakness. °· Vision changes, such as blurred vision. °· Fruity smell to your breath. °· Abdominal pain. °· Nausea or vomiting. °· Cuts or bruises which are slow to heal. °· Tingling or numbness in the hands or feet. °· An open skin wound (ulcer). °DIAGNOSIS °Type 2 diabetes is frequently not diagnosed until complications of diabetes are present. Type 2 diabetes is diagnosed  when symptoms or complications are present and when blood glucose levels are increased. Your blood glucose level may be checked by one or more of the following blood tests: °· A fasting blood glucose test. You will not be allowed to eat for at least 8 hours before a blood sample is taken. °· A random blood glucose test. Your blood glucose is checked at any time of the day regardless of when you ate. °· A hemoglobin A1c blood glucose test. A hemoglobin A1c test provides information about blood glucose control over the previous 3 months. °· An oral glucose tolerance test (OGTT). Your blood glucose is measured after you have not eaten (fasted) for 2 hours and then after you drink a glucose-containing beverage. °TREATMENT  °· You may need to take insulin or diabetes medicine daily to keep blood glucose levels in the desired range. °· If you use insulin, you may need to adjust the dosage depending on the carbohydrates that you eat with each meal or snack. °· Lifestyle changes are recommended as part of your treatment. These may include: °¨ Following an individualized diet plan developed by a nutritionist or dietitian. °¨ Exercising daily. °Your health care providers will set individualized treatment goals for you based on your age, your medicines, how long you have had diabetes, and any other medical conditions you have. Generally, the goal of treatment is to maintain the following blood glucose levels: °· Before meals (preprandial): 80-130 mg/dL. °· After meals (postprandial): below 180 mg/dL. °· A1c: less than 6.5-7%. °HOME CARE INSTRUCTIONS  °· Have your hemoglobin A1c level checked twice a year. °· Perform daily blood glucose monitoring   as directed by your health care provider. °· Monitor urine ketones when you are ill and as directed by your health care provider. °· Take your diabetes medicine or insulin as directed by your health care provider to maintain your blood glucose levels in the desired range. °¨ Never run  out of diabetes medicine or insulin. It is needed every day. °¨ If you are using insulin, you may need to adjust the amount of insulin given based on your intake of carbohydrates. Carbohydrates can raise blood glucose levels but need to be included in your diet. Carbohydrates provide vitamins, minerals, and fiber which are an essential part of a healthy diet. Carbohydrates are found in fruits, vegetables, whole grains, dairy products, legumes, and foods containing added sugars. °· Eat healthy foods. You should make an appointment to see a registered dietitian to help you create an eating plan that is right for you. °· Lose weight if you are overweight. °· Carry a medical alert card or wear your medical alert jewelry. °· Carry a 15-gram carbohydrate snack with you at all times to treat low blood glucose (hypoglycemia). Some examples of 15-gram carbohydrate snacks include: °¨ Glucose tablets, 3 or 4. °¨ Glucose gel, 15-gram tube. °¨ Raisins, 2 tablespoons (24 grams). °¨ Jelly beans, 6. °¨ Animal crackers, 8. °¨ Regular pop, 4 ounces (120 mL). °¨ Gummy treats, 9. °· Recognize hypoglycemia. Hypoglycemia occurs with blood glucose levels of 70 mg/dL and below. The risk for hypoglycemia increases when fasting or skipping meals, during or after intense exercise, and during sleep. Hypoglycemia symptoms can include: °¨ Tremors or shakes. °¨ Decreased ability to concentrate. °¨ Sweating. °¨ Increased heart rate. °¨ Headache. °¨ Dry mouth. °¨ Hunger. °¨ Irritability. °¨ Anxiety. °¨ Restless sleep. °¨ Altered speech or coordination. °¨ Confusion. °· Treat hypoglycemia promptly. If you are alert and able to safely swallow, follow the 15:15 rule: °¨ Take 15-20 grams of rapid-acting glucose or carbohydrate. Rapid-acting options include glucose gel, glucose tablets, or 4 ounces (120 mL) of fruit juice, regular soda, or low-fat milk. °¨ Check your blood glucose level 15 minutes after taking the glucose. °¨ Take 15-20 grams more of  glucose if the repeat blood glucose level is still 70 mg/dL or below. °¨ Eat a meal or snack within 1 hour once blood glucose levels return to normal. °· Be alert to feeling very thirsty and urinating more frequently than usual, which are early signs of hyperglycemia. An early awareness of hyperglycemia allows for prompt treatment. Treat hyperglycemia as directed by your health care provider. °· Engage in at least 150 minutes of moderate-intensity physical activity a week, spread over at least 3 days of the week or as directed by your health care provider. In addition, you should engage in resistance exercise at least 2 times a week or as directed by your health care provider. Try to spend no more than 90 minutes at one time inactive. °· Adjust your medicine and food intake as needed if you start a new exercise or sport. °· Follow your sick-day plan anytime you are unable to eat or drink as usual. °· Do not use any tobacco products including cigarettes, chewing tobacco, or electronic cigarettes. If you need help quitting, ask your health care provider. °· Limit alcohol intake to no more than 1 drink per day for nonpregnant women and 2 drinks per day for men. You should drink alcohol only when you are also eating food. Talk with your health care provider whether alcohol is safe   for you. Tell your health care provider if you drink alcohol several times a week. °· Keep all follow-up visits as directed by your health care provider. This is important. °· Schedule an eye exam soon after the diagnosis of type 2 diabetes and then annually. °· Perform daily skin and foot care. Examine your skin and feet daily for cuts, bruises, redness, nail problems, bleeding, blisters, or sores. A foot exam by a health care provider should be done annually. °· Brush your teeth and gums at least twice a day and floss at least once a day. Follow up with your dentist regularly. °· Share your diabetes management plan with your workplace or  school. °· Keep your immunizations up to date. It is recommended that you receive a flu (influenza) vaccine every year. It is also recommended that you receive a pneumonia (pneumococcal) vaccine. If you are 65 years of age or older and have never received a pneumonia vaccine, this vaccine may be given as a series of two separate shots. Ask your health care provider which additional vaccines may be recommended. °· Learn to manage stress. °· Obtain ongoing diabetes education and support as needed. °· Participate in or seek rehabilitation as needed to maintain or improve independence and quality of life. Request a physical or occupational therapy referral if you are having foot or hand numbness, or difficulties with grooming, dressing, eating, or physical activity. °SEEK MEDICAL CARE IF:  °· You are unable to eat food or drink fluids for more than 6 hours. °· You have nausea and vomiting for more than 6 hours. °· Your blood glucose level is over 240 mg/dL. °· There is a change in mental status. °· You develop an additional serious illness. °· You have diarrhea for more than 6 hours. °· You have been sick or have had a fever for a couple of days and are not getting better. °· You have pain during any physical activity.   °SEEK IMMEDIATE MEDICAL CARE IF: °· You have difficulty breathing. °· You have moderate to large ketone levels. °  °This information is not intended to replace advice given to you by your health care provider. Make sure you discuss any questions you have with your health care provider. °  °Document Released: 06/24/2005 Document Revised: 03/15/2015 Document Reviewed: 01/21/2012 °Elsevier Interactive Patient Education ©2016 [employer] ° °

## 2016-03-13 NOTE — Progress Notes (Signed)
Subjective:  Patient ID: Cody Hale, male    DOB: 06/29/1951  Age: 65 y.o. MRN: [id number]  CC: Hyperlipidemia; Diabetes; and Allergic Rhinitis    HPI Cody Hale presents for follow-up- he complains of worsening swelling in his left lower extremity over the last 4-6 weeks. He tells me that a week ago he saw his orthopedic surgeon and had steroids injected in both knees for osteoarthritis. He states the knees feel much better but are still uncomfortable. He also complains of worsening postnasal drip and nasal congestion over the last few weeks related to ragweed pollen.  Outpatient Medications Prior to Visit  Medication Sig Dispense Refill  . Blood Glucose Monitoring Suppl (ONETOUCH VERIO IQ SYSTEM) W/DEVICE KIT 1 Act by Does not apply route 3 (three) times daily. 2 kit 0  . carvedilol (COREG) 6.25 MG tablet TAKE 1 TABLET BY MOUTH TWICE DAILY. 60 tablet 11  . clonazePAM (KLONOPIN) 1 MG tablet Take 1 tablet (1 mg total) by mouth 2 (two) times daily as needed for anxiety. 60 tablet 5  . glucose blood (ACCU-CHEK AVIVA PLUS) test strip Use to check blood sugars three times a day Dx e11.9 100 each 5  . Insulin Glargine (LANTUS SOLOSTAR) 100 UNIT/ML Solostar Pen Inject 30 Units into the skin daily at 10 pm. 15 mL 11  . Lancets (ONETOUCH ULTRASOFT) lancets Use to help check blood sugars twice a day E11.8 100 each 3  . metFORMIN (GLUCOPHAGE-XR) 750 MG 24 hr tablet Take 2 tablets (1,500 mg total) by mouth daily with breakfast. 180 tablet 1  . Multiple Vitamin (DAILY VITE) TABS TAKE 1 TABLET BY MOUTH ONCE DAILY. 30 tablet 11  . nitroGLYCERIN (NITROSTAT) 0.4 MG SL tablet Place 1 tablet (0.4 mg total) under the tongue every 5 (five) minutes x 3 doses as needed for chest pain. 25 tablet 3  . NOVOFINE AUTOCOVER 30G X 8 MM MISC     . potassium chloride (K-DUR) 10 MEQ tablet     . potassium chloride SA (K-DUR,KLOR-CON) 10 MEQ tablet Take 1 tablet (10 mEq total) by mouth daily. 90 tablet 3  .  simvastatin (ZOCOR) 20 MG tablet TAKE 1 TABLET BY MOUTH ONCE DAILY. 30 tablet 11  . torsemide (DEMADEX) 20 MG tablet Take 1 tablet (20 mg total) by mouth daily. 90 tablet 1  . valsartan (DIOVAN) 40 MG tablet Take 1 tablet (40 mg total) by mouth 2 (two) times daily. 180 tablet 3  . Vilazodone HCl (VIIBRYD) 40 MG TABS Take 1 tablet (40 mg total) by mouth daily. 30 tablet 11  . XARELTO 20 MG TABS tablet TAKE 1 TABLET BY MOUTH ONCE DAILY. 30 tablet 5  . HYDROcodone-acetaminophen (NORCO) 10-325 MG tablet     . oxyCODONE (OXY IR/ROXICODONE) 5 MG immediate release tablet Take 1 tablet (5 mg total) by mouth every 6 (six) hours as needed for severe pain. 75 tablet 0   No facility-administered medications prior to visit.     ROS Review of Systems  Constitutional: Negative.  Negative for activity change, appetite change, chills, diaphoresis, fatigue, fever and unexpected weight change.  HENT: Positive for congestion, postnasal drip and rhinorrhea. Negative for facial swelling, sinus pressure, sneezing, sore throat, tinnitus and trouble swallowing.   Eyes: Negative.  Negative for visual disturbance.  Respiratory: Negative.  Negative for cough, choking, chest tightness, shortness of breath and stridor.   Cardiovascular: Positive for leg swelling. Negative for chest pain and palpitations.  Gastrointestinal: Negative for abdominal pain, constipation,  diarrhea, nausea and vomiting.  Endocrine: Negative.   Genitourinary: Negative.  Negative for difficulty urinating and dysuria.  Musculoskeletal: Positive for arthralgias. Negative for back pain, joint swelling, myalgias and neck pain.  Skin: Negative.  Negative for color change.  Allergic/Immunologic: Negative.   Neurological: Negative.  Negative for dizziness, weakness, light-headedness, numbness and headaches.  Hematological: Negative.  Negative for adenopathy. Does not bruise/bleed easily.    Objective:  BP 130/60 (BP Location: Left Arm, Patient  Position: Sitting, Cuff Size: Normal)   Pulse 82   Temp 98.2 F (36.8 C) (Oral)   Resp 16   Ht 5' 6.5" (1.689 m)   Wt 298 lb (135.2 kg)   SpO2 93%   BMI 47.38 kg/m   BP Readings from Last 3 Encounters:  03/13/16 130/60  03/05/16 (!) 155/81  01/25/16 118/66    Wt Readings from Last 3 Encounters:  03/13/16 298 lb (135.2 kg)  03/05/16 300 lb (136.1 kg)  01/25/16 291 lb 2 oz (132.1 kg)    Physical Exam  Constitutional: He is oriented to person, place, and time. No distress.  HENT:  Nose: No rhinorrhea or sinus tenderness. Right sinus exhibits no maxillary sinus tenderness and no frontal sinus tenderness. Left sinus exhibits no maxillary sinus tenderness and no frontal sinus tenderness.  Mouth/Throat: Oropharynx is clear and moist. No oropharyngeal exudate.  Eyes: Conjunctivae are normal. Right eye exhibits no discharge. Left eye exhibits no discharge. No scleral icterus.  Neck: Normal range of motion. Neck supple. No JVD present. No tracheal deviation present. No thyromegaly present.  Cardiovascular: Normal heart sounds and intact distal pulses.  An irregularly irregular rhythm present. Tachycardia present.  Exam reveals no gallop and no friction rub.   No murmur heard. Pulmonary/Chest: Effort normal and breath sounds normal. No stridor. No respiratory distress. He has no wheezes. He has no rales. He exhibits no tenderness.  Abdominal: Soft. Bowel sounds are normal. He exhibits no distension. There is no tenderness. There is no rebound and no guarding.  Musculoskeletal: Normal range of motion. He exhibits no edema, tenderness or deformity.  Left calf is moderately larger than the right calf but there is no appreciable edema in the lower extremities  Lymphadenopathy:    He has no cervical adenopathy.  Neurological: He is oriented to person, place, and time.  Skin: Skin is warm and dry. No rash noted. He is not diaphoretic. No erythema. No pallor.  Psychiatric: He has a normal mood  and affect. His behavior is normal. Judgment and thought content normal.  Vitals reviewed.   Lab Results  Component Value Date   WBC 3.5 (L) 10/12/2015   HGB 13.7 10/12/2015   HCT 41.4 10/12/2015   PLT 103.0 R (L) 10/12/2015   GLUCOSE 143 (H) 03/13/2016   CHOL 102 03/13/2016   TRIG 108.0 03/13/2016   HDL 46.80 03/13/2016   LDLDIRECT 84.2 01/31/2014   LDLCALC 34 03/13/2016   ALT 22 10/12/2015   AST 19 10/12/2015   NA 142 03/13/2016   K 4.7 03/13/2016   CL 104 03/13/2016   CREATININE 0.95 03/13/2016   BUN 23 03/13/2016   CO2 33 (H) 03/13/2016   TSH 0.90 10/12/2015   PSA 0.36 05/30/2014   INR 1.13 11/30/2012   HGBA1C 7.5 (H) 03/13/2016   MICROALBUR 0.7 11/22/2014    No results found.  Assessment & Plan:   Cody Hale was seen today for hyperlipidemia, diabetes and allergic rhinitis .  Diagnoses and all orders for this visit:  Need  for prophylactic vaccination and inoculation against influenza -     Flu vaccine HIGH DOSE PF (Fluzone High dose)  Left leg swelling- I have ordered an ultrasound to see if there is a Baker's cyst or DVT -     Cancel: VAS [place] LOWER EXTREMITY VENOUS (DVT); Future -     VAS [place] LOWER EXTREMITY VENOUS (DVT); Future  Morbid obesity due to excess calories ([place])- he agrees to decrease his caloric intake to help him lose weight, he doesn't think he can be more active.  Type 2 diabetes mellitus with complication, with long-term current use of insulin ([place])- A1c is at 7.5%, his blood sugars are adequately well controlled. -     Basic metabolic panel; Future -     Hemoglobin A1c; Future  Essential hypertension, malignant- his blood pressures adequately well-controlled, electrolytes and renal function are stable. -     Basic metabolic panel; Future  Hyperlipidemia with target LDL less than 70- he has achieved his LDL goal is doing well on the statin. -     Lipid panel; Future  Allergic rhinitis due to pollen- will treat with the combination of a steroid  nasal spray plus an antihistamine nasal spray. -     fluticasone (FLONASE) 50 MCG/ACT nasal spray; Place 2 sprays into both nostrils daily. -     azelastine (ASTELIN) 0.1 % nasal spray; Place 1 spray into both nostrils 2 (two) times daily. Use in each nostril as directed  Primary osteoarthritis involving multiple joints -     Discontinue: oxyCODONE (OXY IR/ROXICODONE) 5 MG immediate release tablet; Take 1 tablet (5 mg total) by mouth every 6 (six) hours as needed for severe pain. -     Discontinue: oxyCODONE (OXY IR/ROXICODONE) 5 MG immediate release tablet; Take 1 tablet (5 mg total) by mouth every 6 (six) hours as needed for severe pain. -     oxyCODONE (OXY IR/ROXICODONE) 5 MG immediate release tablet; Take 1 tablet (5 mg total) by mouth every 6 (six) hours as needed for severe pain.   I have discontinued Cody Hale HYDROcodone-acetaminophen. I am also having him start on fluticasone and azelastine. Additionally, I am having him maintain his nitroGLYCERIN, ONETOUCH VERIO IQ SYSTEM, onetouch ultrasoft, potassium chloride, Vilazodone HCl, DAILY VITE, XARELTO, carvedilol, simvastatin, glucose blood, valsartan, torsemide, clonazePAM, metFORMIN, Insulin Glargine, NOVOFINE AUTOCOVER, potassium chloride, and oxyCODONE.  Meds ordered this encounter  Medications  . fluticasone (FLONASE) 50 MCG/ACT nasal spray    Sig: Place 2 sprays into both nostrils daily.    Dispense:  16 g    Refill:  11  . azelastine (ASTELIN) 0.1 % nasal spray    Sig: Place 1 spray into both nostrils 2 (two) times daily. Use in each nostril as directed    Dispense:  30 mL    Refill:  12  . DISCONTD: oxyCODONE (OXY IR/ROXICODONE) 5 MG immediate release tablet    Sig: Take 1 tablet (5 mg total) by mouth every 6 (six) hours as needed for severe pain.    Dispense:  75 tablet    Refill:  0    Fill on or after 03/13/16  . DISCONTD: oxyCODONE (OXY IR/ROXICODONE) 5 MG immediate release tablet    Sig: Take 1 tablet (5 mg total) by  mouth every 6 (six) hours as needed for severe pain.    Dispense:  75 tablet    Refill:  0    Fill on or after 04/12/16  . oxyCODONE (OXY IR/ROXICODONE) 5 MG  immediate release tablet    Sig: Take 1 tablet (5 mg total) by mouth every 6 (six) hours as needed for severe pain.    Dispense:  75 tablet    Refill:  0    Fill on or after 05/13/16     Follow-up: Return in about 4 months (around 07/13/2016).  Scarlette Calico, MD

## 2016-03-13 NOTE — Progress Notes (Signed)
Pre visit review using our clinic review tool, if applicable. No additional management support is needed unless otherwise documented below in the visit note. 

## 2016-03-25 ENCOUNTER — Ambulatory Visit (INDEPENDENT_AMBULATORY_CARE_PROVIDER_SITE_OTHER): Payer: Medicare Other | Admitting: Cardiovascular Disease

## 2016-03-25 ENCOUNTER — Encounter: Payer: Self-pay | Admitting: Cardiovascular Disease

## 2016-03-25 VITALS — BP 138/58 | HR 74 | Ht 69.0 in | Wt 300.0 lb

## 2016-03-25 DIAGNOSIS — I4891 Unspecified atrial fibrillation: Secondary | ICD-10-CM | POA: Diagnosis not present

## 2016-03-25 DIAGNOSIS — Z954 Presence of other heart-valve replacement: Secondary | ICD-10-CM

## 2016-03-25 DIAGNOSIS — I5022 Chronic systolic (congestive) heart failure: Secondary | ICD-10-CM | POA: Diagnosis not present

## 2016-03-25 DIAGNOSIS — I447 Left bundle-branch block, unspecified: Secondary | ICD-10-CM | POA: Diagnosis not present

## 2016-03-25 DIAGNOSIS — I10 Essential (primary) hypertension: Secondary | ICD-10-CM

## 2016-03-25 DIAGNOSIS — I429 Cardiomyopathy, unspecified: Secondary | ICD-10-CM | POA: Diagnosis not present

## 2016-03-25 DIAGNOSIS — Z952 Presence of prosthetic heart valve: Secondary | ICD-10-CM

## 2016-03-25 NOTE — Patient Instructions (Signed)
Your physician wants you to follow-up in: 1 year Dr Koneswaran You will receive a reminder letter in the mail two months in advance. If you don't receive a letter, please call our office to schedule the follow-up appointment.     Your physician recommends that you continue on your current medications as directed. Please refer to the Current Medication list given to you today.    If you need a refill on your cardiac medications before your next appointment, please call your pharmacy.      Thank you for choosing [facility] !         
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
## 2016-04-01 ENCOUNTER — Other Ambulatory Visit: Payer: Self-pay | Admitting: Internal Medicine

## 2016-04-02 ENCOUNTER — Telehealth: Payer: Self-pay

## 2016-04-02 ENCOUNTER — Other Ambulatory Visit: Payer: Self-pay | Admitting: Internal Medicine

## 2016-04-02 ENCOUNTER — Ambulatory Visit (INDEPENDENT_AMBULATORY_CARE_PROVIDER_SITE_OTHER): Payer: Medicare Other | Admitting: Orthopaedic Surgery

## 2016-04-02 ENCOUNTER — Encounter: Payer: Self-pay | Admitting: Orthopaedic Surgery

## 2016-04-02 VITALS — BP 137/68 | HR 70 | Temp 98.1°F | Ht 68.0 in | Wt 303.0 lb

## 2016-04-02 DIAGNOSIS — M25551 Pain in right hip: Secondary | ICD-10-CM

## 2016-04-02 DIAGNOSIS — M7989 Other specified soft tissue disorders: Secondary | ICD-10-CM

## 2016-04-02 DIAGNOSIS — M25561 Pain in right knee: Secondary | ICD-10-CM

## 2016-04-02 NOTE — Telephone Encounter (Signed)
Pt request for [place] to be done at [facility].   Order changed per Jeani Hawking request.

## 2016-04-02 NOTE — Telephone Encounter (Signed)
-----   Message from Lysle Rubens sent at 04/02/2016  8:31 AM EDT ----- In order to have this scan done at the hospital the order will need to be put in under [facility]. Can you please do this asap. The pt called yesterday about this.  Thank you very much!

## 2016-04-02 NOTE — Progress Notes (Signed)
CC:  I have pain of my right knee. I would like an injection.  The patient has chronic pain of the right knee.  There is no recent trauma.  There is no redness.  Injections in the past have helped.  The knee has no redness, has an effusion and crepitus present.  ROM of the right knee is 0-105.  Impression:  Chronic knee pain right  Return: 3 weeks  PROCEDURE NOTE:  The patient requests injections of the right knee , verbal consent was obtained.  The right knee was prepped appropriately after time out was performed.   Sterile technique was observed and injection of 1 cc of Depo-Medrol 40 mg with several cc's of plain xylocaine. Anesthesia was provided by ethyl chloride and a 20-gauge needle was used to inject the knee area. The injection was tolerated well.  A band aid dressing was applied.  The patient was advised to apply ice later today and tomorrow to the injection sight as needed.  Electronically Signed Darreld Mclean, MD 9/26/201711:04 AM

## 2016-04-03 ENCOUNTER — Ambulatory Visit (HOSPITAL_COMMUNITY): Payer: Medicare Other

## 2016-04-09 ENCOUNTER — Ambulatory Visit (HOSPITAL_COMMUNITY)
Admission: RE | Admit: 2016-04-09 | Discharge: 2016-04-09 | Disposition: A | Discharge status: Home / Self Care | Payer: Medicare Other | Source: Ambulatory Visit | Attending: Internal Medicine | Admitting: Internal Medicine

## 2016-04-09 DIAGNOSIS — M7989 Other specified soft tissue disorders: Secondary | ICD-10-CM | POA: Insufficient documentation

## 2016-04-24 DIAGNOSIS — Z7984 Long term (current) use of oral hypoglycemic drugs: Secondary | ICD-10-CM | POA: Diagnosis not present

## 2016-04-24 DIAGNOSIS — E119 Type 2 diabetes mellitus without complications: Secondary | ICD-10-CM | POA: Diagnosis not present

## 2016-04-24 DIAGNOSIS — H25813 Combined forms of age-related cataract, bilateral: Secondary | ICD-10-CM | POA: Diagnosis not present

## 2016-04-30 ENCOUNTER — Encounter: Payer: Self-pay | Admitting: Orthopaedic Surgery

## 2016-04-30 ENCOUNTER — Other Ambulatory Visit: Payer: Self-pay | Admitting: *Deleted

## 2016-04-30 ENCOUNTER — Ambulatory Visit (INDEPENDENT_AMBULATORY_CARE_PROVIDER_SITE_OTHER): Payer: Medicare Other | Admitting: Orthopaedic Surgery

## 2016-04-30 VITALS — BP 138/71 | HR 73 | Temp 98.1°F | Ht 68.0 in | Wt 309.0 lb

## 2016-04-30 DIAGNOSIS — G8929 Other chronic pain: Secondary | ICD-10-CM | POA: Diagnosis not present

## 2016-04-30 DIAGNOSIS — M25561 Pain in right knee: Secondary | ICD-10-CM | POA: Diagnosis not present

## 2016-04-30 DIAGNOSIS — R600 Localized edema: Secondary | ICD-10-CM

## 2016-04-30 NOTE — Progress Notes (Signed)
Dr. Romeo Apple evaluated patient for right total knee  This is a 65 year old male he's had injections in his knee for osteoarthritis has end-stage disease with varus deformity is using a cane  As I discussed with him he has bilateral peripheral edema or peripheral vascular disease  He status post pig valve heart valve replacement asymptomatic but on Xarelto  He has a BMI of 46 para primary care physician Dr. Sanda Linger.  He lives at a nursing home which is high risk for MRSA infection  We will start the workup in process to get him ready for knee replacement and follow-up with me in one month
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
## 2016-05-04 ENCOUNTER — Encounter (HOSPITAL_COMMUNITY): Payer: Self-pay

## 2016-05-04 ENCOUNTER — Emergency Department (HOSPITAL_COMMUNITY)
Admission: EM | Admit: 2016-05-04 | Discharge: 2016-05-04 | Disposition: A | Discharge status: Home / Self Care | Payer: Medicare Other | Source: Home / Self Care | Attending: Emergency Medicine | Admitting: Emergency Medicine

## 2016-05-04 ENCOUNTER — Emergency Department (HOSPITAL_COMMUNITY): Payer: Medicare Other

## 2016-05-04 DIAGNOSIS — Z7901 Long term (current) use of anticoagulants: Secondary | ICD-10-CM | POA: Diagnosis not present

## 2016-05-04 DIAGNOSIS — I5023 Acute on chronic systolic (congestive) heart failure: Secondary | ICD-10-CM | POA: Diagnosis not present

## 2016-05-04 DIAGNOSIS — Z87891 Personal history of nicotine dependence: Secondary | ICD-10-CM | POA: Diagnosis not present

## 2016-05-04 DIAGNOSIS — J449 Chronic obstructive pulmonary disease, unspecified: Secondary | ICD-10-CM

## 2016-05-04 DIAGNOSIS — E119 Type 2 diabetes mellitus without complications: Secondary | ICD-10-CM | POA: Insufficient documentation

## 2016-05-04 DIAGNOSIS — I110 Hypertensive heart disease with heart failure: Secondary | ICD-10-CM

## 2016-05-04 DIAGNOSIS — R110 Nausea: Secondary | ICD-10-CM | POA: Diagnosis not present

## 2016-05-04 DIAGNOSIS — I5022 Chronic systolic (congestive) heart failure: Secondary | ICD-10-CM | POA: Insufficient documentation

## 2016-05-04 DIAGNOSIS — I2510 Atherosclerotic heart disease of native coronary artery without angina pectoris: Secondary | ICD-10-CM

## 2016-05-04 DIAGNOSIS — Z79899 Other long term (current) drug therapy: Secondary | ICD-10-CM

## 2016-05-04 DIAGNOSIS — R112 Nausea with vomiting, unspecified: Secondary | ICD-10-CM | POA: Insufficient documentation

## 2016-05-04 DIAGNOSIS — Z794 Long term (current) use of insulin: Secondary | ICD-10-CM

## 2016-05-04 DIAGNOSIS — R140 Abdominal distension (gaseous): Secondary | ICD-10-CM | POA: Diagnosis not present

## 2016-05-04 DIAGNOSIS — Z6841 Body Mass Index (BMI) 40.0 and over, adult: Secondary | ICD-10-CM | POA: Diagnosis not present

## 2016-05-04 DIAGNOSIS — K2970 Gastritis, unspecified, without bleeding: Secondary | ICD-10-CM | POA: Diagnosis not present

## 2016-05-04 DIAGNOSIS — R0602 Shortness of breath: Secondary | ICD-10-CM | POA: Diagnosis not present

## 2016-05-04 DIAGNOSIS — R1084 Generalized abdominal pain: Secondary | ICD-10-CM

## 2016-05-04 DIAGNOSIS — R0902 Hypoxemia: Secondary | ICD-10-CM | POA: Diagnosis not present

## 2016-05-04 DIAGNOSIS — R109 Unspecified abdominal pain: Secondary | ICD-10-CM | POA: Diagnosis not present

## 2016-05-04 DIAGNOSIS — R10814 Left lower quadrant abdominal tenderness: Secondary | ICD-10-CM | POA: Diagnosis not present

## 2016-05-04 DIAGNOSIS — I482 Chronic atrial fibrillation: Secondary | ICD-10-CM | POA: Diagnosis not present

## 2016-05-04 LAB — URINE MICROSCOPIC-ADD ON: RBC / HPF: NONE SEEN RBC/hpf (ref 0–5)

## 2016-05-04 LAB — CBC WITH DIFFERENTIAL/PLATELET
Basophils Absolute: 0 10*3/uL (ref 0.0–0.1)
Basophils Relative: 0 %
Eosinophils Absolute: 0.1 10*3/uL (ref 0.0–0.7)
Eosinophils Relative: 1 %
HCT: 43.2 % (ref 39.0–52.0)
Hemoglobin: 14.1 g/dL (ref 13.0–17.0)
Lymphocytes Relative: 6 %
Lymphs Abs: 0.5 10*3/uL — ABNORMAL LOW (ref 0.7–4.0)
MCH: 31.5 pg (ref 26.0–34.0)
MCHC: 32.6 g/dL (ref 30.0–36.0)
MCV: 96.4 fL (ref 78.0–100.0)
Monocytes Absolute: 0.3 10*3/uL (ref 0.1–1.0)
Monocytes Relative: 3 %
Neutro Abs: 8.7 10*3/uL — ABNORMAL HIGH (ref 1.7–7.7)
Neutrophils Relative %: 90 %
Platelets: 137 10*3/uL — ABNORMAL LOW (ref 150–400)
RBC: 4.48 MIL/uL (ref 4.22–5.81)
RDW: 13.8 % (ref 11.5–15.5)
WBC: 9.7 10*3/uL (ref 4.0–10.5)

## 2016-05-04 LAB — COMPREHENSIVE METABOLIC PANEL
ALT: 24 U/L (ref 17–63)
AST: 21 U/L (ref 15–41)
Albumin: 4.1 g/dL (ref 3.5–5.0)
Alkaline Phosphatase: 81 U/L (ref 38–126)
Anion gap: 7 (ref 5–15)
BUN: 16 mg/dL (ref 6–20)
CO2: 28 mmol/L (ref 22–32)
Calcium: 9.1 mg/dL (ref 8.9–10.3)
Chloride: 104 mmol/L (ref 101–111)
Creatinine, Ser: 1.03 mg/dL (ref 0.61–1.24)
GFR calc Af Amer: >60 mL/min (ref >60)
GFR calc non Af Amer: >60 mL/min (ref >60)
Glucose, Bld: 279 mg/dL — ABNORMAL HIGH (ref 65–99)
Potassium: 4.2 mmol/L (ref 3.5–5.1)
Sodium: 139 mmol/L (ref 135–145)
Total Bilirubin: 0.7 mg/dL (ref 0.3–1.2)
Total Protein: 7.5 g/dL (ref 6.5–8.1)

## 2016-05-04 LAB — URINALYSIS, ROUTINE W REFLEX MICROSCOPIC
Bilirubin Urine: NEGATIVE
Glucose, UA: 250 mg/dL — AB
Hgb urine dipstick: NEGATIVE
Nitrite: NEGATIVE
Specific Gravity, Urine: 1.02 (ref 1.005–1.030)
pH: 6 (ref 5.0–8.0)

## 2016-05-04 LAB — LIPASE, BLOOD: Lipase: 17 U/L (ref 11–51)

## 2016-05-04 MED ORDER — HALOPERIDOL LACTATE 5 MG/ML IJ SOLN
3.0000 mg | Freq: Once | INTRAMUSCULAR | Status: AC
  Administered 2016-05-04: 3 mg via INTRAVENOUS
  Filled 2016-05-04: qty 1

## 2016-05-04 NOTE — ED Notes (Signed)
Call to [facility] re pt ready  [phone]  Cody Hale up on x 2
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
## 2016-05-04 NOTE — ED Notes (Signed)
Dr Kohut in to reassess 

## 2016-05-04 NOTE — ED Triage Notes (Signed)
Patient reports of eating supper then had sudden onset of abdominal pain with belching.  Denies diarrhea.  Patient has distended abdomen which he reports is not his norm.    Per EMS patient had 4 mg Zofran IV enroute and of NS.

## 2016-05-04 NOTE — ED Notes (Signed)
MD at bedside. 

## 2016-05-04 NOTE — ED Notes (Signed)
To xray at this time.

## 2016-05-04 NOTE — ED Notes (Signed)
Call to [facility] - they will send someone to take patient home

## 2016-05-05 ENCOUNTER — Encounter (HOSPITAL_COMMUNITY): Payer: Self-pay | Admitting: *Deleted

## 2016-05-05 ENCOUNTER — Emergency Department (HOSPITAL_COMMUNITY): Payer: Medicare Other

## 2016-05-05 ENCOUNTER — Observation Stay (HOSPITAL_COMMUNITY): Payer: Medicare Other

## 2016-05-05 ENCOUNTER — Inpatient Hospital Stay (HOSPITAL_COMMUNITY)
Admission: EM | Admit: 2016-05-05 | Discharge: 2016-05-08 | DRG: 292 | Disposition: A | Discharge status: Skilled Nursing Facility | Payer: Medicare Other | Attending: Internal Medicine | Admitting: Internal Medicine

## 2016-05-05 DIAGNOSIS — Z7951 Long term (current) use of inhaled steroids: Secondary | ICD-10-CM | POA: Diagnosis not present

## 2016-05-05 DIAGNOSIS — I482 Chronic atrial fibrillation, unspecified: Secondary | ICD-10-CM | POA: Diagnosis present

## 2016-05-05 DIAGNOSIS — Z79899 Other long term (current) drug therapy: Secondary | ICD-10-CM | POA: Diagnosis not present

## 2016-05-05 DIAGNOSIS — I10 Essential (primary) hypertension: Secondary | ICD-10-CM | POA: Diagnosis present

## 2016-05-05 DIAGNOSIS — Z7984 Long term (current) use of oral hypoglycemic drugs: Secondary | ICD-10-CM | POA: Diagnosis not present

## 2016-05-05 DIAGNOSIS — K3184 Gastroparesis: Secondary | ICD-10-CM | POA: Diagnosis present

## 2016-05-05 DIAGNOSIS — Z8249 Family history of ischemic heart disease and other diseases of the circulatory system: Secondary | ICD-10-CM | POA: Diagnosis not present

## 2016-05-05 DIAGNOSIS — I447 Left bundle-branch block, unspecified: Secondary | ICD-10-CM | POA: Diagnosis present

## 2016-05-05 DIAGNOSIS — G4733 Obstructive sleep apnea (adult) (pediatric): Secondary | ICD-10-CM | POA: Diagnosis present

## 2016-05-05 DIAGNOSIS — I5023 Acute on chronic systolic (congestive) heart failure: Secondary | ICD-10-CM | POA: Diagnosis present

## 2016-05-05 DIAGNOSIS — Z7901 Long term (current) use of anticoagulants: Secondary | ICD-10-CM | POA: Diagnosis not present

## 2016-05-05 DIAGNOSIS — M6281 Muscle weakness (generalized): Secondary | ICD-10-CM

## 2016-05-05 DIAGNOSIS — Z801 Family history of malignant neoplasm of trachea, bronchus and lung: Secondary | ICD-10-CM | POA: Diagnosis not present

## 2016-05-05 DIAGNOSIS — Z888 Allergy status to other drugs, medicaments and biological substances status: Secondary | ICD-10-CM | POA: Diagnosis not present

## 2016-05-05 DIAGNOSIS — J4489 Other specified chronic obstructive pulmonary disease: Secondary | ICD-10-CM | POA: Diagnosis present

## 2016-05-05 DIAGNOSIS — R009 Unspecified abnormalities of heart beat: Secondary | ICD-10-CM | POA: Diagnosis present

## 2016-05-05 DIAGNOSIS — E1143 Type 2 diabetes mellitus with diabetic autonomic (poly)neuropathy: Secondary | ICD-10-CM | POA: Diagnosis present

## 2016-05-05 DIAGNOSIS — Z953 Presence of xenogenic heart valve: Secondary | ICD-10-CM

## 2016-05-05 DIAGNOSIS — Z8261 Family history of arthritis: Secondary | ICD-10-CM

## 2016-05-05 DIAGNOSIS — Z9889 Other specified postprocedural states: Secondary | ICD-10-CM | POA: Diagnosis not present

## 2016-05-05 DIAGNOSIS — M1990 Unspecified osteoarthritis, unspecified site: Secondary | ICD-10-CM | POA: Diagnosis present

## 2016-05-05 DIAGNOSIS — Z6841 Body Mass Index (BMI) 40.0 and over, adult: Secondary | ICD-10-CM | POA: Diagnosis not present

## 2016-05-05 DIAGNOSIS — R600 Localized edema: Secondary | ICD-10-CM

## 2016-05-05 DIAGNOSIS — Z811 Family history of alcohol abuse and dependence: Secondary | ICD-10-CM | POA: Diagnosis not present

## 2016-05-05 DIAGNOSIS — Z87442 Personal history of urinary calculi: Secondary | ICD-10-CM | POA: Diagnosis not present

## 2016-05-05 DIAGNOSIS — R0902 Hypoxemia: Secondary | ICD-10-CM | POA: Diagnosis not present

## 2016-05-05 DIAGNOSIS — Z8041 Family history of malignant neoplasm of ovary: Secondary | ICD-10-CM | POA: Diagnosis not present

## 2016-05-05 DIAGNOSIS — I2510 Atherosclerotic heart disease of native coronary artery without angina pectoris: Secondary | ICD-10-CM | POA: Diagnosis present

## 2016-05-05 DIAGNOSIS — J449 Chronic obstructive pulmonary disease, unspecified: Secondary | ICD-10-CM | POA: Diagnosis present

## 2016-05-05 DIAGNOSIS — Z833 Family history of diabetes mellitus: Secondary | ICD-10-CM

## 2016-05-05 DIAGNOSIS — Z952 Presence of prosthetic heart valve: Secondary | ICD-10-CM | POA: Diagnosis not present

## 2016-05-05 DIAGNOSIS — Z794 Long term (current) use of insulin: Secondary | ICD-10-CM

## 2016-05-05 DIAGNOSIS — I5022 Chronic systolic (congestive) heart failure: Secondary | ICD-10-CM

## 2016-05-05 DIAGNOSIS — I110 Hypertensive heart disease with heart failure: Principal | ICD-10-CM | POA: Diagnosis present

## 2016-05-05 DIAGNOSIS — E785 Hyperlipidemia, unspecified: Secondary | ICD-10-CM | POA: Diagnosis present

## 2016-05-05 DIAGNOSIS — R748 Abnormal levels of other serum enzymes: Secondary | ICD-10-CM | POA: Diagnosis present

## 2016-05-05 DIAGNOSIS — R011 Cardiac murmur, unspecified: Secondary | ICD-10-CM | POA: Diagnosis present

## 2016-05-05 DIAGNOSIS — R0602 Shortness of breath: Secondary | ICD-10-CM | POA: Diagnosis not present

## 2016-05-05 DIAGNOSIS — Z87891 Personal history of nicotine dependence: Secondary | ICD-10-CM | POA: Diagnosis not present

## 2016-05-05 DIAGNOSIS — E118 Type 2 diabetes mellitus with unspecified complications: Secondary | ICD-10-CM | POA: Diagnosis present

## 2016-05-05 DIAGNOSIS — I872 Venous insufficiency (chronic) (peripheral): Secondary | ICD-10-CM | POA: Diagnosis present

## 2016-05-05 LAB — BLOOD GAS, ARTERIAL
Acid-Base Excess: 2.2 mmol/L — ABNORMAL HIGH (ref 0.0–2.0)
Bicarbonate: 25 mmol/L (ref 20.0–28.0)
Delivery systems: POSITIVE
Drawn by: 10555
Expiratory PAP: 6
FIO2: 100
Inspiratory PAP: 14
O2 Saturation: 99.7 %
RATE: 8 resp/min
pCO2 arterial: 57.6 mmHg — ABNORMAL HIGH (ref 32.0–48.0)
pH, Arterial: 7.306 — ABNORMAL LOW (ref 7.350–7.450)
pO2, Arterial: 464 mmHg — ABNORMAL HIGH (ref 83.0–108.0)

## 2016-05-05 LAB — COMPREHENSIVE METABOLIC PANEL
ALT: 23 U/L (ref 17–63)
AST: 21 U/L (ref 15–41)
Albumin: 3.7 g/dL (ref 3.5–5.0)
Alkaline Phosphatase: 76 U/L (ref 38–126)
Anion gap: 6 (ref 5–15)
BUN: 16 mg/dL (ref 6–20)
CO2: 31 mmol/L (ref 22–32)
Calcium: 9.1 mg/dL (ref 8.9–10.3)
Chloride: 103 mmol/L (ref 101–111)
Creatinine, Ser: 1.02 mg/dL (ref 0.61–1.24)
GFR calc Af Amer: >60 mL/min (ref >60)
GFR calc non Af Amer: >60 mL/min (ref >60)
Glucose, Bld: 218 mg/dL — ABNORMAL HIGH (ref 65–99)
Potassium: 4.4 mmol/L (ref 3.5–5.1)
Sodium: 140 mmol/L (ref 135–145)
Total Bilirubin: 0.7 mg/dL (ref 0.3–1.2)
Total Protein: 7.2 g/dL (ref 6.5–8.1)

## 2016-05-05 LAB — CBC
HCT: 42.6 % (ref 39.0–52.0)
Hemoglobin: 13.8 g/dL (ref 13.0–17.0)
MCH: 31.6 pg (ref 26.0–34.0)
MCHC: 32.4 g/dL (ref 30.0–36.0)
MCV: 97.5 fL (ref 78.0–100.0)
Platelets: 153 10*3/uL (ref 150–400)
RBC: 4.37 MIL/uL (ref 4.22–5.81)
RDW: 13.9 % (ref 11.5–15.5)
WBC: 9.5 10*3/uL (ref 4.0–10.5)

## 2016-05-05 LAB — TROPONIN I
Troponin I: <0.03 ng/mL (ref <0.03)
Troponin I: 0.05 ng/mL (ref <0.03)
Troponin I: 0.06 ng/mL (ref <0.03)

## 2016-05-05 LAB — ECHOCARDIOGRAM COMPLETE
Height: 69 in
Weight: 4874.81 oz

## 2016-05-05 LAB — GLUCOSE, CAPILLARY
Glucose-Capillary: 204 mg/dL — ABNORMAL HIGH (ref 65–99)
Glucose-Capillary: 156 mg/dL — ABNORMAL HIGH (ref 65–99)
Glucose-Capillary: 184 mg/dL — ABNORMAL HIGH (ref 65–99)
Glucose-Capillary: 178 mg/dL — ABNORMAL HIGH (ref 65–99)

## 2016-05-05 LAB — BRAIN NATRIURETIC PEPTIDE: B Natriuretic Peptide: 96 pg/mL (ref 0.0–100.0)

## 2016-05-05 LAB — MRSA PCR SCREENING: MRSA by PCR: NEGATIVE

## 2016-05-05 LAB — MAGNESIUM: Magnesium: 1.8 mg/dL (ref 1.7–2.4)

## 2016-05-05 MED ORDER — FUROSEMIDE 10 MG/ML IJ SOLN
40.0000 mg | Freq: Once | INTRAMUSCULAR | Status: AC
  Administered 2016-05-05: 40 mg via INTRAVENOUS

## 2016-05-05 MED ORDER — IRBESARTAN 75 MG PO TABS
37.5000 mg | ORAL_TABLET | Freq: Every day | ORAL | Status: DC
Start: 2016-05-05 — End: 2016-05-05
  Administered 2016-05-05: 37.5 mg via ORAL
  Filled 2016-05-05: qty 1

## 2016-05-05 MED ORDER — INSULIN ASPART 100 UNIT/ML ~~LOC~~ SOLN
0.0000 [IU] | Freq: Three times a day (TID) | SUBCUTANEOUS | Status: DC
  Administered 2016-05-05: 5 [IU] via SUBCUTANEOUS
  Administered 2016-05-05 – 2016-05-06 (×5): 3 [IU] via SUBCUTANEOUS
  Administered 2016-05-07: 5 [IU] via SUBCUTANEOUS
  Administered 2016-05-07: 3 [IU] via SUBCUTANEOUS
  Administered 2016-05-07 – 2016-05-08 (×2): 5 [IU] via SUBCUTANEOUS

## 2016-05-05 MED ORDER — MAGNESIUM OXIDE 400 (241.3 MG) MG PO TABS
400.0000 mg | ORAL_TABLET | Freq: Every day | ORAL | Status: DC
  Administered 2016-05-05 – 2016-05-08 (×4): 400 mg via ORAL
  Filled 2016-05-05 (×4): qty 1

## 2016-05-05 MED ORDER — IPRATROPIUM-ALBUTEROL 0.5-2.5 (3) MG/3ML IN SOLN
3.0000 mL | Freq: Four times a day (QID) | RESPIRATORY_TRACT | Status: DC
  Administered 2016-05-05 – 2016-05-08 (×13): 3 mL via RESPIRATORY_TRACT
  Filled 2016-05-05 (×12): qty 3

## 2016-05-05 MED ORDER — IPRATROPIUM-ALBUTEROL 0.5-2.5 (3) MG/3ML IN SOLN
3.0000 mL | Freq: Once | RESPIRATORY_TRACT | Status: AC
  Administered 2016-05-05: 3 mL via RESPIRATORY_TRACT
  Filled 2016-05-05: qty 3

## 2016-05-05 MED ORDER — FUROSEMIDE 10 MG/ML IJ SOLN
INTRAMUSCULAR | Status: AC
  Filled 2016-05-05: qty 8

## 2016-05-05 MED ORDER — ALBUTEROL SULFATE (2.5 MG/3ML) 0.083% IN NEBU: 2.5000 mg | INHALATION_SOLUTION | RESPIRATORY_TRACT | Status: DC | PRN

## 2016-05-05 MED ORDER — RIVAROXABAN 20 MG PO TABS
20.0000 mg | ORAL_TABLET | Freq: Every day | ORAL | Status: DC
  Administered 2016-05-05 – 2016-05-08 (×4): 20 mg via ORAL
  Filled 2016-05-05 (×4): qty 1

## 2016-05-05 MED ORDER — SIMVASTATIN 20 MG PO TABS
20.0000 mg | ORAL_TABLET | Freq: Every day | ORAL | Status: DC
  Administered 2016-05-05 – 2016-05-08 (×4): 20 mg via ORAL
  Filled 2016-05-05 (×5): qty 1

## 2016-05-05 MED ORDER — POTASSIUM CHLORIDE CRYS ER 20 MEQ PO TBCR
40.0000 meq | EXTENDED_RELEASE_TABLET | Freq: Every day | ORAL | Status: DC
  Administered 2016-05-05 – 2016-05-08 (×4): 40 meq via ORAL
  Filled 2016-05-05 (×4): qty 2

## 2016-05-05 MED ORDER — LORAZEPAM 2 MG/ML IJ SOLN: 1.0000 mg | Freq: Every day | INTRAMUSCULAR | Status: AC

## 2016-05-05 MED ORDER — CARVEDILOL 3.125 MG PO TABS
6.2500 mg | ORAL_TABLET | Freq: Two times a day (BID) | ORAL | Status: DC
  Administered 2016-05-05 – 2016-05-08 (×8): 6.25 mg via ORAL
  Filled 2016-05-05 (×8): qty 2

## 2016-05-05 MED ORDER — NITROGLYCERIN 2 % TD OINT
0.5000 [in_us] | TOPICAL_OINTMENT | Freq: Once | TRANSDERMAL | Status: DC
  Filled 2016-05-05: qty 1

## 2016-05-05 MED ORDER — ASPIRIN 81 MG PO CHEW
324.0000 mg | CHEWABLE_TABLET | Freq: Once | ORAL | Status: AC
  Administered 2016-05-05: 324 mg via ORAL
  Filled 2016-05-05: qty 4

## 2016-05-05 MED ORDER — PERFLUTREN LIPID MICROSPHERE
1.0000 mL | INTRAVENOUS | Status: AC | PRN
  Administered 2016-05-05: 2 mL via INTRAVENOUS
  Filled 2016-05-05: qty 10

## 2016-05-05 MED ORDER — FUROSEMIDE 10 MG/ML IJ SOLN
80.0000 mg | Freq: Two times a day (BID) | INTRAMUSCULAR | Status: DC
  Administered 2016-05-05 – 2016-05-08 (×6): 80 mg via INTRAVENOUS
  Filled 2016-05-05 (×6): qty 8

## 2016-05-05 MED ORDER — MAGNESIUM SULFATE 2 GM/50ML IV SOLN
2.0000 g | Freq: Once | INTRAVENOUS | Status: AC
  Administered 2016-05-05: 2 g via INTRAVENOUS
  Filled 2016-05-05: qty 50

## 2016-05-05 MED ORDER — SODIUM CHLORIDE 0.9% FLUSH
3.0000 mL | Freq: Two times a day (BID) | INTRAVENOUS | Status: DC
  Administered 2016-05-05 – 2016-05-08 (×7): 3 mL via INTRAVENOUS

## 2016-05-05 MED ORDER — FUROSEMIDE 10 MG/ML IJ SOLN
40.0000 mg | Freq: Two times a day (BID) | INTRAMUSCULAR | Status: DC
  Administered 2016-05-05: 40 mg via INTRAVENOUS
  Filled 2016-05-05: qty 4

## 2016-05-05 NOTE — Progress Notes (Signed)
Critical troponin value of 0.05, MD notified via text page
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
## 2016-05-05 NOTE — Progress Notes (Signed)
Patient has 30 -40 sec sleep apnea .

## 2016-05-05 NOTE — ED Triage Notes (Signed)
Pt was seen in the ER for abdominal pain & states now is SOB, pt has hx of CHF.

## 2016-05-05 NOTE — Progress Notes (Signed)
PROGRESS NOTE  Cody Hale  QMV:[id number] DOB: 27-Mar-1951 DOA: 05/05/2016 PCP: Sanda Lingerhomas Jones, MD Outpatient Specialists:  Subjective: Seen while he was getting his echo done, reported shortness of breath. Still has significant lower extremity edema of +2.  Brief Narrative:  Cody Hale is a 65 y.o. male with medical history significant of anxiety, chronic atrial fibrillation on Xarelto 20 mg daily, chronic systolic heart failure, mild nonobstructive CAD, LBBB, PSVT, type 2 diabetes, gastroparesis, DJD, essential hypertension, history of endocarditis treated with Zyvox at [facility] in March/2014, history of prosthetic mitral valve, essential hypertension, morbid obesity, nephrolithiasis, recurrent major depressive disorder who returns for the second time to the emergency department today due to shortness of breath.  Assessment & Plan:   Principal Problem:   Acute on chronic systolic CHF (congestive heart failure) (HCC) Active Problems:   OSA (obstructive sleep apnea)   Essential hypertension, malignant   Type II diabetes mellitus with manifestations (HCC)   Hyperlipidemia with target LDL less than 70   COPD (chronic obstructive pulmonary disease) with chronic bronchitis (HCC)   Chronic atrial fibrillation (HCC)   S/P mitral valve replacement with bioprosthetic valve   This is a no charge note, patient seen earlier today by my colleague Dr. Robb Matarrtiz. Patient seen and examined, database reviewed. Admitted for acute on chronic systolic CHF, continue diuresis, check 2-D echo.  Acute on chronic systolic CHF (congestive heart failure) (HCC) Admit to ICU/observation. Continue supplemental oxygen. Continue BiPAP ventilation. Trend troponin levels Daily weights. Monitor intake and output. Start furosemide 40 mg IVP twice a day. Monitor BUN, creatinine and electrolytes. Check echocardiogram in the morning.  Active Problems:   OSA (obstructive sleep apnea) On BiPAP  ventilation at this time.    Essential hypertension, malignant Carvedilol 6.25 mg by mouth twice a day. Diovan 40 mg or generic equivalent by mouth twice daily.    Type II diabetes mellitus with manifestations (HCC) Carbohydrate modified diet. Continue metformin and start 1500 mg by mouth with breakfast    Hyperlipidemia with target LDL less than 70 Not currently on a statin.    COPD (chronic obstructive pulmonary disease) with chronic bronchitis (HCC) Continue supplemental oxygen and BiPAP ventilation. Bronchodilators as needed.     Chronic atrial fibrillation (HCC) CHA2DS2-VASc Score of at least 5. Since the patient is currently on BiPAP ventilation and feeling better, will continue carvedilol 6.25 mg by mouth twice a day for rate control    S/P mitral valve replacement with bioprosthetic valve Echocardiogram has been ordered.   DVT prophylaxis: Xarelto Code Status: Full Code Family Communication:  Disposition Plan:  Diet: Diet heart healthy/carb modified Room service appropriate? Yes; Fluid consistency: Thin; Fluid restriction: 1500 mL Fluid  Consultants:   None  Procedures:   Echo pending  Antimicrobials:   None  Objective: Vitals:   05/05/16 0600 05/05/16 0700 05/05/16 0722 05/05/16 0800  BP: (!) 178/82 (!) 160/72  (!) 144/79  Pulse: 91 91 79 89  Resp: (!) 30 19 (!) 25 (!) 28  Temp:   98.3 F (36.8 C)   TempSrc:   Axillary   SpO2: (!) 80% 96% 97% 100%  Weight:      Height:        Intake/Output Summary (Last 24 hours) at 05/05/16 1102 Last data filed at 05/05/16 1018  Gross per 24 hour  Intake               50 ml  Output  1100 ml  Net            -1050 ml   Filed Weights   05/05/16 0041 05/05/16 0500  Weight: (!) 140.2 kg (309 lb) (!) 138.2 kg (304 lb 10.8 oz)    Examination: General exam: Appears calm and comfortable  Respiratory system: Clear to auscultation. Respiratory effort normal. Cardiovascular system: S1 & S2  heard, RRR. No JVD, murmurs, rubs, gallops or clicks. No pedal edema. Gastrointestinal system: Abdomen is nondistended, soft and nontender. No organomegaly or masses felt. Normal bowel sounds heard. Central nervous system: Alert and oriented. No focal neurological deficits. Extremities: +2 bilateral lower extremity edema Skin: No rashes, lesions or ulcers Psychiatry: Judgement and insight appear normal. Mood & affect appropriate.   Data Reviewed: I have personally reviewed following labs and imaging studies  CBC:  Recent Labs Lab 05/04/16 2121 05/05/16 0924  WBC 9.7 9.5  NEUTROABS 8.7*  --   HGB 14.1 13.8  HCT 43.2 42.6  MCV 96.4 97.5  PLT 137* 153   Basic Metabolic Panel:  Recent Labs Lab 05/04/16 2121 05/05/16 0924  NA 139 140  K 4.2 4.4  CL 104 103  CO2 28 31  GLUCOSE 279* 218*  BUN 16 16  CREATININE 1.03 1.02  CALCIUM 9.1 9.1  MG 1.8  --    GFR: Estimated Creatinine Clearance: 99.8 mL/min (by C-G formula based on SCr of 1.02 mg/dL). Liver Function Tests:  Recent Labs Lab 05/04/16 2121 05/05/16 0924  AST 21 21  ALT 24 23  ALKPHOS 81 76  BILITOT 0.7 0.7  PROT 7.5 7.2  ALBUMIN 4.1 3.7    Recent Labs Lab 05/04/16 2121  LIPASE 17   No results for input(s): AMMONIA in the last 168 hours. Coagulation Profile: No results for input(s): INR, PROTIME in the last 168 hours. Cardiac Enzymes:  Recent Labs Lab 05/04/16 2121 05/05/16 0343 05/05/16 0924  TROPONINI <0.03 0.05* 0.06*   BNP (last 3 results)  Recent Labs  10/12/15 0930  PROBNP 157.0*   HbA1C: No results for input(s): HGBA1C in the last 72 hours. CBG:  Recent Labs Lab 05/05/16 0721  GLUCAP 204*   Lipid Profile: No results for input(s): CHOL, HDL, LDLCALC, TRIG, CHOLHDL, LDLDIRECT in the last 72 hours. Thyroid Function Tests: No results for input(s): TSH, T4TOTAL, FREET4, T3FREE, THYROIDAB in the last 72 hours. Anemia Panel: No results for input(s): VITAMINB12, FOLATE,  FERRITIN, TIBC, IRON, RETICCTPCT in the last 72 hours. Urine analysis:    Component Value Date/Time   COLORURINE YELLOW 05/04/2016 2118   APPEARANCEUR CLEAR 05/04/2016 2118   LABSPEC 1.020 05/04/2016 2118   PHURINE 6.0 05/04/2016 2118   GLUCOSEU 250 (A) 05/04/2016 2118   GLUCOSEU NEGATIVE 10/12/2015 0930   HGBUR NEGATIVE 05/04/2016 2118   BILIRUBINUR NEGATIVE 05/04/2016 2118   KETONESUR TRACE (A) 05/04/2016 2118   PROTEINUR TRACE (A) 05/04/2016 2118   UROBILINOGEN 0.2 10/12/2015 0930   NITRITE NEGATIVE 05/04/2016 2118   LEUKOCYTESUR TRACE (A) 05/04/2016 2118   Sepsis Labs: @LABRCNTIP (procalcitonin:4,lacticidven:4)  ) Recent Results (from the past 240 hour(s))  MRSA PCR Screening     Status: None   Collection Time: 05/05/16  3:04 AM  Result Value Ref Range Status   MRSA by PCR NEGATIVE NEGATIVE Final    Comment:        The GeneXpert MRSA Assay (FDA approved for NASAL specimens only), is one component of a comprehensive MRSA colonization surveillance program. It is not intended to diagnose MRSA infection nor  to guide or monitor treatment for MRSA infections.      Invalid input(s): PROCALCITONIN, LACTICACIDVEN   Radiology Studies: Dg Abdomen 1 View  Result Date: 05/04/2016 CLINICAL DATA:  Abdominal pain and distention. EXAM: ABDOMEN - 1 VIEW COMPARISON:  None. FINDINGS: The bowel gas pattern is normal. No radio-opaque calculi or other significant radiographic abnormality are seen. IMPRESSION: Negative. Electronically Signed   By: Gerome Sam III M.D   On: 05/04/2016 22:22   Dg Chest Portable 1 View  Result Date: 05/05/2016 CLINICAL DATA:  Shortness of breath. EXAM: PORTABLE CHEST 1 VIEW COMPARISON:  January 19, 2015 FINDINGS: Cardiomegaly and moderate edema. No other acute abnormalities or changes. Healed right rib fracture. IMPRESSION: Cardiomegaly and moderate edema. No other acute abnormalities or changes. Electronically Signed   By: Gerome Sam III M.D   On:  05/05/2016 01:15        Scheduled Meds: . carvedilol  6.25 mg Oral BID  . furosemide  40 mg Intravenous BID  . insulin aspart  0-15 Units Subcutaneous TID WC  . ipratropium-albuterol  3 mL Nebulization Q6H  . irbesartan  37.5 mg Oral Daily  . LORazepam  1 mg Intravenous QHS  . rivaroxaban  20 mg Oral Daily  . simvastatin  20 mg Oral Daily  . sodium chloride flush  3 mL Intravenous Q12H   Continuous Infusions:    LOS: 0 days    Time spent: 35 minutes    Miangel Flom A, MD [facility] Pager [phone]  If 7PM-7AM, please contact night-coverage [email or web address] Password [email or web address] 05/05/2016, 11:02 AM
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
## 2016-05-05 NOTE — Progress Notes (Signed)
*  PRELIMINARY RESULTS* Echocardiogram 2D Echocardiogram has been performed.  Jeryl Columbia 05/05/2016, 10:03 AM

## 2016-05-06 DIAGNOSIS — I10 Essential (primary) hypertension: Secondary | ICD-10-CM

## 2016-05-06 DIAGNOSIS — I482 Chronic atrial fibrillation: Secondary | ICD-10-CM

## 2016-05-06 DIAGNOSIS — Z953 Presence of xenogenic heart valve: Secondary | ICD-10-CM

## 2016-05-06 DIAGNOSIS — J449 Chronic obstructive pulmonary disease, unspecified: Secondary | ICD-10-CM

## 2016-05-06 DIAGNOSIS — E118 Type 2 diabetes mellitus with unspecified complications: Secondary | ICD-10-CM

## 2016-05-06 DIAGNOSIS — E785 Hyperlipidemia, unspecified: Secondary | ICD-10-CM

## 2016-05-06 DIAGNOSIS — Z794 Long term (current) use of insulin: Secondary | ICD-10-CM

## 2016-05-06 DIAGNOSIS — G4733 Obstructive sleep apnea (adult) (pediatric): Secondary | ICD-10-CM

## 2016-05-06 LAB — BASIC METABOLIC PANEL
Anion gap: 6 (ref 5–15)
BUN: 18 mg/dL (ref 6–20)
CO2: 36 mmol/L — ABNORMAL HIGH (ref 22–32)
Calcium: 9 mg/dL (ref 8.9–10.3)
Chloride: 97 mmol/L — ABNORMAL LOW (ref 101–111)
Creatinine, Ser: 1.01 mg/dL (ref 0.61–1.24)
GFR calc Af Amer: >60 mL/min (ref >60)
GFR calc non Af Amer: >60 mL/min (ref >60)
Glucose, Bld: 197 mg/dL — ABNORMAL HIGH (ref 65–99)
Potassium: 4.5 mmol/L (ref 3.5–5.1)
Sodium: 139 mmol/L (ref 135–145)

## 2016-05-06 LAB — GLUCOSE, CAPILLARY
Glucose-Capillary: 175 mg/dL — ABNORMAL HIGH (ref 65–99)
Glucose-Capillary: 175 mg/dL — ABNORMAL HIGH (ref 65–99)
Glucose-Capillary: 198 mg/dL — ABNORMAL HIGH (ref 65–99)
Glucose-Capillary: 246 mg/dL — ABNORMAL HIGH (ref 65–99)

## 2016-05-06 LAB — CBG MONITORING, ED: Glucose-Capillary: 273 mg/dL — ABNORMAL HIGH (ref 65–99)

## 2016-05-06 MED ORDER — CHLORHEXIDINE GLUCONATE 0.12 % MT SOLN
15.0000 mL | Freq: Two times a day (BID) | OROMUCOSAL | Status: DC
  Administered 2016-05-06 (×2): 15 mL via OROMUCOSAL
  Filled 2016-05-06 (×3): qty 15

## 2016-05-06 MED ORDER — ORAL CARE MOUTH RINSE: 15.0000 mL | Freq: Two times a day (BID) | OROMUCOSAL | Status: DC

## 2016-05-06 NOTE — Progress Notes (Signed)
Inpatient Diabetes Program Recommendations  AACE/ADA: New Consensus Statement on Inpatient Glycemic Control (2015)  Target Ranges:  Prepandial:   less than 140 mg/dL      Peak postprandial:   less than 180 mg/dL (1-2 hours)      Critically ill patients:  140 - 180 mg/dL   Results for BARD, NATTRESS (MRN [id number]) as of 05/06/2016 09:09  Ref. Range 05/05/2016 07:21 05/05/2016 11:38 05/05/2016 15:57 05/05/2016 21:09  Glucose-Capillary Latest Ref Range: 65 - 99 mg/dL 595 (H) 638 (H) 756 (H) 178 (H)   Results for KENTREL, MAYLE (MRN [id number]) as of 05/06/2016 09:09  Ref. Range 05/06/2016 07:23  Glucose-Capillary Latest Ref Range: 65 - 99 mg/dL 416 (H)    Admit with: SOB  History: DM, CHF  Home DM Meds: Lantus 30 units QHS       Metformin 1500 mg daily  Current Insulin Orders: Novolog Moderate Correction Scale/ SSI (0-15 units) TID AC      MD- Please consider starting a portion of pt's home dose of Lantus.   Recommend Lantus 15 units QHS (50% home dose to start)      --Will follow patient during hospitalization--  Ambrose Finland RN, MSN, CDE Diabetes Coordinator Inpatient Glycemic Control Team Team Pager: [phone] (8a-5p)

## 2016-05-06 NOTE — Care Management Important Message (Signed)
Important Message  Patient Details  Name: Cody Hale MRN: [id number] Date of Birth: Oct 15, 1950   Medicare Important Message Given:  Yes    Malcolm Metro, RN 05/06/2016, 2:39 PM

## 2016-05-06 NOTE — Progress Notes (Signed)
PROGRESS NOTE  Cody Hale  ZOX:[id number] DOB: 01/30/1951 DOA: 05/05/2016 PCP: Sanda Linger, MD Outpatient Specialists:  Subjective: Still complaining about SOB, denies fever chills.  Brief Narrative:  Cody Hale is a 65 y.o. male with medical history significant of anxiety, chronic atrial fibrillation on Xarelto 20 mg daily, chronic systolic heart failure, mild nonobstructive CAD, LBBB, PSVT, type 2 diabetes, gastroparesis, DJD, essential hypertension, history of endocarditis treated with Zyvox at [facility] in March/2014, history of prosthetic mitral valve, essential hypertension, morbid obesity, nephrolithiasis, recurrent major depressive disorder who returns for the second time to the emergency department today due to shortness of breath.  Assessment & Plan:   Principal Problem:   Acute on chronic systolic CHF (congestive heart failure) (HCC) Active Problems:   OSA (obstructive sleep apnea)   Essential hypertension, malignant   Type II diabetes mellitus with manifestations (HCC)   Hyperlipidemia with target LDL Cody than 70   COPD (chronic obstructive pulmonary disease) with chronic bronchitis (HCC)   Chronic atrial fibrillation (HCC)   S/P mitral valve replacement with bioprosthetic valve   Acute on chronic systolic CHF (congestive heart failure) (HCC) -Presented with SOB, lower extremity edema. CXR showed pulmonary edema, BNP is normal at 96. -2-D echo showed LVEF of 40-45%. -Slightly elevated troponin of 0.06, denies any chest pain likely secondary to CHF exacerbation. -Started on IV Lasix at 80 mg twice a day, continue diuresis, restrict fluid and salt intake. -Follow renal function and potassium closely, check BMP daily.  OSA (obstructive sleep apnea) Reportedly does not have CPAP at home.  Essential hypertension, malignant -Carvedilol 6.25 mg by mouth twice a day. -On Diovan 40 mg, held while on aggressive diuresis.  Type II diabetes mellitus with  manifestations (HCC) -Carbohydrate modified diet. -Oral hypoglycemic agents held, on SSI.  Hyperlipidemia with target LDL Cody than 70 -Total cholesterol of 102 and LDL of 34  COPD (chronic obstructive pulmonary disease) with chronic bronchitis (HCC) -Continue supplemental oxygen and BiPAP ventilation. -Bronchodilators as needed.  Chronic atrial fibrillation (HCC) -CHA2DS2-VASc Score of at least 5. On Xarelto. -On carvedilol 6.25 mg for rate control.  S/P mitral valve replacement with bioprosthetic valve Echocardiogram has been ordered.   DVT prophylaxis: Xarelto Code Status: Full Code Family Communication:  Disposition Plan:  Diet: Diet heart healthy/carb modified Room service appropriate? Yes; Fluid consistency: Thin; Fluid restriction: 1500 mL Fluid  Consultants:   None  Procedures:   Echo pending  Antimicrobials:   None  Objective: Vitals:   05/06/16 0747 05/06/16 0800 05/06/16 0900 05/06/16 1000  BP:  126/74 131/76 (!) 153/88  Pulse:  92 89 73  Resp:  17 14 13   Temp: 98.3 F (36.8 C)     TempSrc: Oral     SpO2:  100% 97% 98%  Weight:      Height:        Intake/Output Summary (Last 24 hours) at 05/06/16 1121 Last data filed at 05/06/16 1044  Gross per 24 hour  Intake                0 ml  Output             3050 ml  Net            -3050 ml   Filed Weights   05/05/16 0041 05/05/16 0500 05/06/16 0500  Weight: (!) 140.2 kg (309 lb) (!) 138.2 kg (304 lb 10.8 oz) 135.8 kg (299 lb 6.2 oz)    Examination: General exam: Appears  calm and comfortable  Respiratory system: Clear to auscultation. Respiratory effort normal. Cardiovascular system: S1 & S2 heard, RRR. No JVD, murmurs, rubs, gallops or clicks. No pedal edema. Gastrointestinal system: Abdomen is nondistended, soft and nontender. No organomegaly or masses felt. Normal bowel sounds heard. Central nervous system: Alert and oriented. No focal neurological deficits. Extremities: +2 bilateral  lower extremity edema Skin: No rashes, lesions or ulcers Psychiatry: Judgement and insight appear normal. Mood & affect appropriate.   Data Reviewed: I have personally reviewed following labs and imaging studies  CBC:  Recent Labs Lab 05/04/16 2121 05/05/16 0924  WBC 9.7 9.5  NEUTROABS 8.7*  --   HGB 14.1 13.8  HCT 43.2 42.6  MCV 96.4 97.5  PLT 137* 153   Basic Metabolic Panel:  Recent Labs Lab 05/04/16 2121 05/05/16 0924 05/06/16 0433  NA 139 140 139  K 4.2 4.4 4.5  CL 104 103 97*  CO2 28 31 36*  GLUCOSE 279* 218* 197*  BUN 16 16 18   CREATININE 1.03 1.02 1.01  CALCIUM 9.1 9.1 9.0  MG 1.8  --   --    GFR: Estimated Creatinine Clearance: 99.7 mL/min (by C-G formula based on SCr of 1.01 mg/dL). Liver Function Tests:  Recent Labs Lab 05/04/16 2121 05/05/16 0924  AST 21 21  ALT 24 23  ALKPHOS 81 76  BILITOT 0.7 0.7  PROT 7.5 7.2  ALBUMIN 4.1 3.7    Recent Labs Lab 05/04/16 2121  LIPASE 17   No results for input(s): AMMONIA in the last 168 hours. Coagulation Profile: No results for input(s): INR, PROTIME in the last 168 hours. Cardiac Enzymes:  Recent Labs Lab 05/04/16 2121 05/05/16 0343 05/05/16 0924  TROPONINI <0.03 0.05* 0.06*   BNP (last 3 results)  Recent Labs  10/12/15 0930  PROBNP 157.0*   HbA1C: No results for input(s): HGBA1C in the last 72 hours. CBG:  Recent Labs Lab 05/05/16 0721 05/05/16 1138 05/05/16 1557 05/05/16 2109 05/06/16 0723  GLUCAP 204* 156* 184* 178* 175*   Lipid Profile: No results for input(s): CHOL, HDL, LDLCALC, TRIG, CHOLHDL, LDLDIRECT in the last 72 hours. Thyroid Function Tests: No results for input(s): TSH, T4TOTAL, FREET4, T3FREE, THYROIDAB in the last 72 hours. Anemia Panel: No results for input(s): VITAMINB12, FOLATE, FERRITIN, TIBC, IRON, RETICCTPCT in the last 72 hours. Urine analysis:    Component Value Date/Time   COLORURINE YELLOW 05/04/2016 2118   APPEARANCEUR CLEAR 05/04/2016 2118     LABSPEC 1.020 05/04/2016 2118   PHURINE 6.0 05/04/2016 2118   GLUCOSEU 250 (A) 05/04/2016 2118   GLUCOSEU NEGATIVE 10/12/2015 0930   HGBUR NEGATIVE 05/04/2016 2118   BILIRUBINUR NEGATIVE 05/04/2016 2118   KETONESUR TRACE (A) 05/04/2016 2118   PROTEINUR TRACE (A) 05/04/2016 2118   UROBILINOGEN 0.2 10/12/2015 0930   NITRITE NEGATIVE 05/04/2016 2118   LEUKOCYTESUR TRACE (A) 05/04/2016 2118   Sepsis Labs: @LABRCNTIP (procalcitonin:4,lacticidven:4)  ) Recent Results (from the past 240 hour(s))  MRSA PCR Screening     Status: None   Collection Time: 05/05/16  3:04 AM  Result Value Ref Range Status   MRSA by PCR NEGATIVE NEGATIVE Final    Comment:        The GeneXpert MRSA Assay (FDA approved for NASAL specimens only), is one component of a comprehensive MRSA colonization surveillance program. It is not intended to diagnose MRSA infection nor to guide or monitor treatment for MRSA infections.      Invalid input(s): PROCALCITONIN, LACTICACIDVEN   Radiology Studies: Dg  Abdomen 1 View  Result Date: 05/04/2016 CLINICAL DATA:  Abdominal pain and distention. EXAM: ABDOMEN - 1 VIEW COMPARISON:  None. FINDINGS: The bowel gas pattern is normal. No radio-opaque calculi or other significant radiographic abnormality are seen. IMPRESSION: Negative. Electronically Signed   By: Gerome Sam III M.D   On: 05/04/2016 22:22   Dg Chest Portable 1 View  Result Date: 05/05/2016 CLINICAL DATA:  Shortness of breath. EXAM: PORTABLE CHEST 1 VIEW COMPARISON:  January 19, 2015 FINDINGS: Cardiomegaly and moderate edema. No other acute abnormalities or changes. Healed right rib fracture. IMPRESSION: Cardiomegaly and moderate edema. No other acute abnormalities or changes. Electronically Signed   By: Gerome Sam III M.D   On: 05/05/2016 01:15        Scheduled Meds: . carvedilol  6.25 mg Oral BID  . chlorhexidine  15 mL Mouth Rinse BID  . furosemide  80 mg Intravenous BID  . insulin aspart   0-15 Units Subcutaneous TID WC  . ipratropium-albuterol  3 mL Nebulization Q6H  . LORazepam  1 mg Intravenous QHS  . magnesium oxide  400 mg Oral Daily  . mouth rinse  15 mL Mouth Rinse q12n4p  . potassium chloride  40 mEq Oral Daily  . rivaroxaban  20 mg Oral Daily  . simvastatin  20 mg Oral Daily  . sodium chloride flush  3 mL Intravenous Q12H   Continuous Infusions:    LOS: 1 day    Time spent: 35 minutes    March Steyer A, MD [facility] Pager [phone]  If 7PM-7AM, please contact night-coverage [email or web address] Password [email or web address] 05/06/2016, 11:21 AM

## 2016-05-06 NOTE — Clinical Social Work Note (Signed)
Clinical Social Work Assessment  Patient Details  Name: Cody Hale MRN: [id number] Date of Birth: [birth year]  Date of referral:  05/06/16               Reason for consult:  Facility Placement, Discharge Planning (From [facility])                Permission sought to share information with:    Permission granted to share information::     Name::        Agency::     Relationship::     Contact Information:     Housing/Transportation Living arrangements for the past 2 months:  Assisted Living Facility Source of Information:  Patient, Facility Patient Interpreter Needed:  None Criminal Activity/Legal Involvement Pertinent to Current Situation/Hospitalization:  No - Comment as needed Significant Relationships:  Adult Children Lives with:  Facility Resident Do you feel safe going back to the place where you live?  Yes Need for family participation in patient care:  Yes (Comment)  Care giving concerns:  None identified.    Social Worker assessment / plan:  CSW spoke with Product manager at [facility].  Patient has been a resident at the facility for almost two years. He ambulates with a cane, and receives assistance from staff with bathing. He has support from his son and daughter and does a lot for himself. Patient can return to the facility. Patient confirmed [name]'s statements. He stated that he plans on returning to the facility at discharge and referred to the facility as home. He stated that the staff at the facility treat him very well.   Employment status:  Retired Health and safety inspector:  Medicare PT Recommendations:  Not assessed at this time Information / Referral to community resources:     Patient/Family's Response to care:  Patient is agreeable to return to the facility at discharge.  Patient/Family's Understanding of and Emotional Response to Diagnosis, Current Treatment, and Prognosis:  Patient has understanding of his diagnosis, treatment and prognosis.   Emotional  Assessment Appearance:  Appears stated age Attitude/Demeanor/Rapport:   (Cooperative) Affect (typically observed):  Accepting Orientation:  Oriented to Self, Oriented to Place, Oriented to  Time, Oriented to Situation Alcohol / Substance use:  Not Applicable Psych involvement (Current and /or in the community):  No (Comment)  Discharge Needs  Concerns to be addressed:  Discharge Planning Concerns Readmission within the last 30 days:  No Current discharge risk:  None Barriers to Discharge:  No Barriers Identified   Annice Needy, LCSW 05/06/2016, 11:52 AM

## 2016-05-06 NOTE — Care Management Note (Signed)
Case Management Note  Patient Details  Name: Cody Hale MRN: [id number] Date of Birth: [birth year]  Subjective/Objective:                  Pt admitted with CHF exacerbation. Pt is from [facility]. He is ind with ADL's. He plans to return back to ALF at DC. CSW is aware and will make arrangements for return to facility. Pt was ind PTA. He was not receiving any HH services and did not use supplemental oxygen.   Action/Plan: Will cont to follow pt. Will need to wean from oxygen or have home O2 assessment prior to DC.   Expected Discharge Date:     05/09/2016             Expected Discharge Plan:  Assisted Living / Rest Home  In-House Referral:  Clinical Social Work  Discharge planning Services  CM Consult  Post Acute Care Choice:  NA Choice offered to:  NA  Status of Service:  In process, will continue to follow  Malcolm Metro, RN 05/06/2016, 2:40 PM

## 2016-05-07 DIAGNOSIS — I447 Left bundle-branch block, unspecified: Secondary | ICD-10-CM

## 2016-05-07 LAB — GLUCOSE, CAPILLARY
Glucose-Capillary: 187 mg/dL — ABNORMAL HIGH (ref 65–99)
Glucose-Capillary: 225 mg/dL — ABNORMAL HIGH (ref 65–99)
Glucose-Capillary: 239 mg/dL — ABNORMAL HIGH (ref 65–99)
Glucose-Capillary: 262 mg/dL — ABNORMAL HIGH (ref 65–99)

## 2016-05-07 LAB — BASIC METABOLIC PANEL
Anion gap: 8 (ref 5–15)
BUN: 22 mg/dL — ABNORMAL HIGH (ref 6–20)
CO2: 36 mmol/L — ABNORMAL HIGH (ref 22–32)
Calcium: 9.1 mg/dL (ref 8.9–10.3)
Chloride: 93 mmol/L — ABNORMAL LOW (ref 101–111)
Creatinine, Ser: 0.91 mg/dL (ref 0.61–1.24)
GFR calc Af Amer: >60 mL/min (ref >60)
GFR calc non Af Amer: >60 mL/min (ref >60)
Glucose, Bld: 209 mg/dL — ABNORMAL HIGH (ref 65–99)
Potassium: 4.1 mmol/L (ref 3.5–5.1)
Sodium: 137 mmol/L (ref 135–145)

## 2016-05-07 LAB — MAGNESIUM: Magnesium: 1.9 mg/dL (ref 1.7–2.4)

## 2016-05-07 NOTE — Progress Notes (Signed)
PROGRESS NOTE  Cody Hale  HYW:[id number] DOB: 04-Sep-1950 DOA: 05/05/2016 PCP: Sanda Linger, MD Outpatient Specialists:  Subjective: Feels better, denies any new complaints.  Brief Narrative:  Cody Hale is a 65 y.o. male with medical history significant of anxiety, chronic atrial fibrillation on Xarelto 20 mg daily, chronic systolic heart failure, mild nonobstructive CAD, LBBB, PSVT, type 2 diabetes, gastroparesis, DJD, essential hypertension, history of endocarditis treated with Zyvox at [facility] in March/2014, history of prosthetic mitral valve, essential hypertension, morbid obesity, nephrolithiasis, recurrent major depressive disorder who returns for the second time to the emergency department today due to shortness of breath.  Assessment & Plan:   Principal Problem:   Acute on chronic systolic CHF (congestive heart failure) (HCC) Active Problems:   LBBB (left bundle branch block)   OSA (obstructive sleep apnea)   Essential hypertension, malignant   Type II diabetes mellitus with manifestations (HCC)   Hyperlipidemia with target LDL less than 70   COPD (chronic obstructive pulmonary disease) with chronic bronchitis (HCC)   Chronic atrial fibrillation (HCC)   S/P mitral valve replacement with bioprosthetic valve   Acute on chronic systolic CHF (congestive heart failure) (HCC) -Presented with SOB, lower extremity edema. CXR showed pulmonary edema, BNP is normal at 96. -2-D echo showed LVEF of 40-45%. -Slightly elevated troponin of 0.06, denies any chest pain likely secondary to CHF exacerbation. -Started on IV Lasix at 80 mg twice a day, continue diuresis, restrict fluid and salt intake. -Follow renal function and potassium closely, check BMP daily. Weight down to 295. -Transferred to telemetry  OSA (obstructive sleep apnea) -Reportedly does not have CPAP at home, started on CPAP at night.  Essential hypertension, malignant -Carvedilol 6.25 mg by mouth twice  a day. -On Diovan 40 mg, held while on aggressive diuresis.  Type II diabetes mellitus with manifestations (HCC) -Carbohydrate modified diet. -Oral hypoglycemic agents held, on SSI.  Hyperlipidemia with target LDL less than 70 -Total cholesterol of 102 and LDL of 34  COPD (chronic obstructive pulmonary disease) with chronic bronchitis (HCC) -Continue supplemental oxygen. -Bronchodilators as needed.  Chronic atrial fibrillation (HCC) -CHA2DS2-VASc Score of at least 5. On Xarelto. -On carvedilol 6.25 mg for rate control.  S/P mitral valve replacement with bioprosthetic valve Echocardiogram has been ordered.  LBBB -Chronic and stable   DVT prophylaxis: Xarelto Code Status: Full Code Family Communication:  Disposition Plan: Transfer to telemetry today, can probably discharge back to ALF versus SNF in a.m. pending PT eval. Diet: Diet heart healthy/carb modified Room service appropriate? Yes; Fluid consistency: Thin; Fluid restriction: 1500 mL Fluid  Consultants:   None  Procedures:   Echo Study Conclusions  - Left ventricle: The cavity size was normal. Systolic function was mildly to moderately reduced. The estimated ejection fraction was in the range of 40% to 45%. - Ventricular septum: Septal motion showed moderate dyssynergy. These changes are consistent with intraventricular conduction delay. - Aortic valve: There was mild regurgitation. - Mitral valve: A bioprosthesis was present and functioning normally. The sewing ring had no rocking motion and showed no evidence of dehiscence. - Left atrium: The atrium was moderately dilated. - Right atrium: The atrium was mildly to moderately dilated.   Antimicrobials:   None  Objective: Vitals:   05/07/16 0700 05/07/16 0741 05/07/16 0800 05/07/16 0900  BP: (!) 164/85  137/76 134/78  Pulse: 74  (!) 57 84  Resp: (!) 23  12 11   Temp:  98.1 F (36.7 C)    TempSrc:  Axillary    SpO2: 100%  100% 98%  Weight:        Height:        Intake/Output Summary (Last 24 hours) at 05/07/16 1014 Last data filed at 05/07/16 0904  Gross per 24 hour  Intake             1200 ml  Output             3075 ml  Net            -1875 ml   Filed Weights   05/05/16 0500 05/06/16 0500 05/07/16 0500  Weight: (!) 138.2 kg (304 lb 10.8 oz) 135.8 kg (299 lb 6.2 oz) 133.9 kg (295 lb 3.1 oz)    Examination: General exam: Appears calm and comfortable  Respiratory system: Clear to auscultation. Respiratory effort normal. Cardiovascular system: S1 & S2 heard, RRR. No JVD, murmurs, rubs, gallops or clicks. No pedal edema. Gastrointestinal system: Abdomen is nondistended, soft and nontender. No organomegaly or masses felt. Normal bowel sounds heard. Central nervous system: Alert and oriented. No focal neurological deficits. Extremities: +2 bilateral lower extremity edema Skin: No rashes, lesions or ulcers Psychiatry: Judgement and insight appear normal. Mood & affect appropriate.   Data Reviewed: I have personally reviewed following labs and imaging studies  CBC:  Recent Labs Lab 05/04/16 2121 05/05/16 0924  WBC 9.7 9.5  NEUTROABS 8.7*  --   HGB 14.1 13.8  HCT 43.2 42.6  MCV 96.4 97.5  PLT 137* 153   Basic Metabolic Panel:  Recent Labs Lab 05/04/16 2121 05/05/16 0924 05/06/16 0433 05/07/16 0402  NA 139 140 139 137  K 4.2 4.4 4.5 4.1  CL 104 103 97* 93*  CO2 28 31 36* 36*  GLUCOSE 279* 218* 197* 209*  BUN 16 16 18  22*  CREATININE 1.03 1.02 1.01 0.91  CALCIUM 9.1 9.1 9.0 9.1  MG 1.8  --   --  1.9   GFR: Estimated Creatinine Clearance: 109.9 mL/min (by C-G formula based on SCr of 0.91 mg/dL). Liver Function Tests:  Recent Labs Lab 05/04/16 2121 05/05/16 0924  AST 21 21  ALT 24 23  ALKPHOS 81 76  BILITOT 0.7 0.7  PROT 7.5 7.2  ALBUMIN 4.1 3.7    Recent Labs Lab 05/04/16 2121  LIPASE 17   No results for input(s): AMMONIA in the last 168 hours. Coagulation Profile: No results for  input(s): INR, PROTIME in the last 168 hours. Cardiac Enzymes:  Recent Labs Lab 05/04/16 2121 05/05/16 0343 05/05/16 0924  TROPONINI <0.03 0.05* 0.06*   BNP (last 3 results)  Recent Labs  10/12/15 0930  PROBNP 157.0*   HbA1C: No results for input(s): HGBA1C in the last 72 hours. CBG:  Recent Labs Lab 05/06/16 0723 05/06/16 1128 05/06/16 1641 05/06/16 2117 05/07/16 0733  GLUCAP 175* 175* 198* 246* 187*   Lipid Profile: No results for input(s): CHOL, HDL, LDLCALC, TRIG, CHOLHDL, LDLDIRECT in the last 72 hours. Thyroid Function Tests: No results for input(s): TSH, T4TOTAL, FREET4, T3FREE, THYROIDAB in the last 72 hours. Anemia Panel: No results for input(s): VITAMINB12, FOLATE, FERRITIN, TIBC, IRON, RETICCTPCT in the last 72 hours. Urine analysis:    Component Value Date/Time   COLORURINE YELLOW 05/04/2016 2118   APPEARANCEUR CLEAR 05/04/2016 2118   LABSPEC 1.020 05/04/2016 2118   PHURINE 6.0 05/04/2016 2118   GLUCOSEU 250 (A) 05/04/2016 2118   GLUCOSEU NEGATIVE 10/12/2015 0930   HGBUR NEGATIVE 05/04/2016 2118   BILIRUBINUR NEGATIVE 05/04/2016 2118  KETONESUR TRACE (A) 05/04/2016 2118   PROTEINUR TRACE (A) 05/04/2016 2118   UROBILINOGEN 0.2 10/12/2015 0930   NITRITE NEGATIVE 05/04/2016 2118   LEUKOCYTESUR TRACE (A) 05/04/2016 2118   Sepsis Labs: @LABRCNTIP (procalcitonin:4,lacticidven:4)  ) Recent Results (from the past 240 hour(s))  MRSA PCR Screening     Status: None   Collection Time: 05/05/16  3:04 AM  Result Value Ref Range Status   MRSA by PCR NEGATIVE NEGATIVE Final    Comment:        The GeneXpert MRSA Assay (FDA approved for NASAL specimens only), is one component of a comprehensive MRSA colonization surveillance program. It is not intended to diagnose MRSA infection nor to guide or monitor treatment for MRSA infections.      Invalid input(s): PROCALCITONIN, LACTICACIDVEN   Radiology Studies: No results found.      Scheduled  Meds: . carvedilol  6.25 mg Oral BID  . chlorhexidine  15 mL Mouth Rinse BID  . furosemide  80 mg Intravenous BID  . insulin aspart  0-15 Units Subcutaneous TID WC  . ipratropium-albuterol  3 mL Nebulization Q6H  . magnesium oxide  400 mg Oral Daily  . mouth rinse  15 mL Mouth Rinse q12n4p  . potassium chloride  40 mEq Oral Daily  . rivaroxaban  20 mg Oral Daily  . simvastatin  20 mg Oral Daily  . sodium chloride flush  3 mL Intravenous Q12H   Continuous Infusions:    LOS: 2 days    Time spent: 35 minutes    Felicia Bloomquist A, MD [facility] Pager [phone]  If 7PM-7AM, please contact night-coverage [email or web address] Password [email or web address] 05/07/2016, 10:14 AM

## 2016-05-07 NOTE — Progress Notes (Signed)
Called report to Lyda Jester, RN on dept 300. Verbalized understanding. Pt transferred to room 337 in safe and stable condition.

## 2016-05-07 NOTE — Progress Notes (Signed)
Placed patient on CPAP for the night.  Patient is tolerating well at this time. 

## 2016-05-07 NOTE — Progress Notes (Signed)
Inpatient Diabetes Program Recommendations  AACE/ADA: New Consensus Statement on Inpatient Glycemic Control (2015)  Target Ranges:  Prepandial:   less than 140 mg/dL      Peak postprandial:   less than 180 mg/dL (1-2 hours)      Critically ill patients:  140 - 180 mg/dL   Results for KOFI, PRINKEY (MRN [id number]) as of 05/07/2016 12:51  Ref. Range 05/06/2016 07:23 05/06/2016 11:28 05/06/2016 16:41 05/06/2016 21:17  Glucose-Capillary Latest Ref Range: 65 - 99 mg/dL 627 (H) 035 (H) 009 (H) 246 (H)   Results for YNES, WOLTERING (MRN [id number]) as of 05/07/2016 12:51  Ref. Range 05/07/2016 07:33 05/07/2016 11:34  Glucose-Capillary Latest Ref Range: 65 - 99 mg/dL 169 (H) 678 (H)    Admit with: SOB  History: DM, CHF  Home DM Meds: Lantus 30 units QHS                             Metformin 1500 mg daily  Current Insulin Orders: Novolog Moderate Correction Scale/ SSI (0-15 units) TID AC      MD- Please consider starting a portion of pt's home dose of Lantus.   Recommend Lantus 15 units QHS (50% home dose to start)     --Will follow patient during hospitalization--  Ambrose Finland RN, MSN, CDE Diabetes Coordinator Inpatient Glycemic Control Team Team Pager: [phone] (8a-5p)

## 2016-05-08 DIAGNOSIS — I5023 Acute on chronic systolic (congestive) heart failure: Secondary | ICD-10-CM

## 2016-05-08 LAB — BASIC METABOLIC PANEL
Anion gap: 7 (ref 5–15)
BUN: 20 mg/dL (ref 6–20)
CO2: 36 mmol/L — ABNORMAL HIGH (ref 22–32)
Calcium: 9.2 mg/dL (ref 8.9–10.3)
Chloride: 92 mmol/L — ABNORMAL LOW (ref 101–111)
Creatinine, Ser: 0.8 mg/dL (ref 0.61–1.24)
GFR calc Af Amer: >60 mL/min (ref >60)
GFR calc non Af Amer: >60 mL/min (ref >60)
Glucose, Bld: 221 mg/dL — ABNORMAL HIGH (ref 65–99)
Potassium: 4.1 mmol/L (ref 3.5–5.1)
Sodium: 135 mmol/L (ref 135–145)

## 2016-05-08 LAB — GLUCOSE, CAPILLARY: Glucose-Capillary: 210 mg/dL — ABNORMAL HIGH (ref 65–99)

## 2016-05-08 MED ORDER — IPRATROPIUM-ALBUTEROL 0.5-2.5 (3) MG/3ML IN SOLN: 3.0000 mL | Freq: Two times a day (BID) | RESPIRATORY_TRACT | Status: DC

## 2016-05-08 MED ORDER — TORSEMIDE 20 MG PO TABS: 20.0000 mg | ORAL_TABLET | Freq: Two times a day (BID) | ORAL | 0 refills | Status: DC

## 2016-05-08 NOTE — Discharge Summary (Signed)
[name]:[id number] DOB: October 19, 1950 DOA: 05/05/2016  PCP: Sanda Linger, MD  Admit date: 05/05/2016  Discharge date: 05/08/2016  Admitted From: Home   Disposition:  Home   Recommendations for Outpatient Follow-up:   Follow up with PCP in 1-2 weeks  PCP Please obtain BMP/CBC, 2 view CXR in 1week,  (see Discharge instructions)   PCP Please follow up on the following pending results: Follow BMP, weight and Diuretic dose   Home Health: None   Equipment/Devices: None  Consultations: None Discharge Condition: Stable   CODE STATUS: Full   Diet Recommendation: Heart Healthy low carb with fluid restriction   Chief Complaint  Patient presents with  . Shortness of Breath     Brief history of present illness from the day of admission and additional interim summary    Cody D Smithis a 65 y.o.malewith medical history significant of anxiety, chronic atrial fibrillation on Xarelto 20 mg daily, chronic systolic heart failure, mild nonobstructive CAD, LBBB, PSVT, type 2 diabetes, gastroparesis, DJD, essential hypertension, history of endocarditis treated with Zyvox at [facility] in March/2014, history of prosthetic mitral valve, essential hypertension, morbid obesity, nephrolithiasis, recurrent major depressive disorder who returns for the second time to the emergency department today due to shortness of breath.  Hospital issues addressed     Acute on chronic systolic CHF (congestive heart failure) (HCC)EF 45% -Presented with SOB, lower extremity edema. CXR showed pulmonary edema, BNP is normal at 96. Was diuresed with IV Lasix, much improved, no rales or edema, no oxygen requirement, admitted in the hallway, we'll increase his home Demadex dose, counseled on fluid and salt restriction, will be discharged  home with follow-up with PCP and primary cardiologist. Request PCP to check weight, BMP in diuretic dose within a week.  OSA (obstructive sleep apnea) -Reportedly does not have CPAP at home, he says he qualified for it 2 years ago but never received a CPAP machine, request PCP to have a repeat sleep study done for CPAP machine at home if needed.  Essential hypertension, malignant -Continue home dose Coreg and Diovan, Demadex dose has been doubled, PCP to monitor blood pressure..  Type II diabetes mellitus with manifestations (HCC) -Low carb diet, continue home regimen of insulin and Glucophage..  Hyperlipidemia with target LDL less than 70 -Total cholesterol of 102 and LDL of 34  COPD (chronic obstructive pulmonary disease) with chronic bronchitis (HCC) -At baseline and no acute change. Currently no wheezing or oxygen requirement.  Chronic atrial fibrillation (HCC) -CHA2DS2-VASc Scoreof at least 5. On Xarelto. -On carvedilol 6.25 mg for rate control.  S/P mitral valve replacement with bioprosthetic valve Has a systolic murmur, echocardiogram stable no acute issues.  LBBB -Chronic and stable   Discharge diagnosis     Principal Problem:   Acute on chronic systolic CHF (congestive heart failure) (HCC) Active Problems:   LBBB (left bundle branch block)   OSA (obstructive sleep apnea)   Essential hypertension, malignant   Type II diabetes mellitus with manifestations (HCC)   Hyperlipidemia with target  LDL less than 70   COPD (chronic obstructive pulmonary disease) with chronic bronchitis (HCC)   Chronic atrial fibrillation (HCC)   S/P mitral valve replacement with bioprosthetic valve    Discharge instructions    Discharge Instructions    Discharge instructions    Complete by:  As directed    Follow with Primary MD Sanda Linger, MD in 7 days   Get CBC, CMP, Magnesium, 2 view Chest X ray checked  by Primary MD or SNF MD in 5-7 days ( we routinely change or add  medications that can affect your baseline labs and fluid status, therefore we recommend that you get the mentioned basic workup next visit with your PCP, your PCP may decide not to get them or add new tests based on their clinical decision)   Activity: As tolerated with Full fall precautions use walker/cane & assistance as needed   Disposition Home     Diet:   Heart Healthy Low Carb,  Check your Weight same time everyday, if you gain over 2 pounds, or you develop in leg swelling, experience more shortness of breath or chest pain, call your Primary MD immediately. Follow Cardiac Low Salt Diet and 1.5 lit/day fluid restriction.   On your next visit with your primary care physician please Get Medicines reviewed and adjusted.   Please request your Prim.MD to go over all Hospital Tests and Procedure/Radiological results at the follow up, please get all Hospital records sent to your Prim MD by signing hospital release before you go home.   If you experience worsening of your admission symptoms, develop shortness of breath, life threatening emergency, suicidal or homicidal thoughts you must seek medical attention immediately by calling 911 or calling your MD immediately  if symptoms less severe.  You Must read complete instructions/literature along with all the possible adverse reactions/side effects for all the Medicines you take and that have been prescribed to you. Take any new Medicines after you have completely understood and accpet all the possible adverse reactions/side effects.   Do not drive, operate heavy machinery, perform activities at heights, swimming or participation in water activities or provide baby sitting services if your were admitted for syncope or siezures until you have seen by Primary MD or a Neurologist and advised to do so again.  Do not drive when taking Pain medications.    Do not take more than prescribed Pain, Sleep and Anxiety Medications  Special Instructions:  If you have smoked or chewed Tobacco  in the last 2 yrs please stop smoking, stop any regular Alcohol  and or any Recreational drug use.  Wear Seat belts while driving.   Please note  You were cared for by a hospitalist during your hospital stay. If you have any questions about your discharge medications or the care you received while you were in the hospital after you are discharged, you can call the unit and asked to speak with the hospitalist on call if the hospitalist that took care of you is not available. Once you are discharged, your primary care physician will handle any further medical issues. Please note that NO REFILLS for any discharge medications will be authorized once you are discharged, as it is imperative that you return to your primary care physician (or establish a relationship with a primary care physician if you do not have one) for your aftercare needs so that they can reassess your need for medications and monitor your lab values.   Increase activity slowly  Complete by:  As directed       Discharge Medications     Medication List    TAKE these medications   azelastine 0.1 % nasal spray Commonly known as:  ASTELIN Place 1 spray into both nostrils 2 (two) times daily. Use in each nostril as directed   carvedilol 6.25 MG tablet Commonly known as:  COREG TAKE 1 TABLET BY MOUTH TWICE DAILY.   DAILY VITE Tabs TAKE 1 TABLET BY MOUTH ONCE DAILY.   fluticasone 50 MCG/ACT nasal spray Commonly known as:  FLONASE Place 2 sprays into both nostrils daily.   Insulin Glargine 100 UNIT/ML Solostar Pen Commonly known as:  LANTUS SOLOSTAR Inject 30 Units into the skin daily at 10 pm.   metFORMIN 750 MG 24 hr tablet Commonly known as:  GLUCOPHAGE-XR Take 2 tablets (1,500 mg total) by mouth daily with breakfast.   nitroGLYCERIN 0.4 MG SL tablet Commonly known as:  NITROSTAT Place 1 tablet (0.4 mg total) under the tongue every 5 (five) minutes x 3 doses as needed for  chest pain.   oxyCODONE 5 MG immediate release tablet Commonly known as:  Oxy IR/ROXICODONE Take 1 tablet (5 mg total) by mouth every 6 (six) hours as needed for severe pain.   potassium chloride 10 MEQ tablet Commonly known as:  K-DUR,KLOR-CON Take 1 tablet (10 mEq total) by mouth daily.   simvastatin 20 MG tablet Commonly known as:  ZOCOR TAKE 1 TABLET BY MOUTH ONCE DAILY.   torsemide 20 MG tablet Commonly known as:  DEMADEX Take 1 tablet (20 mg total) by mouth 2 (two) times daily. What changed:  when to take this   valsartan 40 MG tablet Commonly known as:  DIOVAN Take 1 tablet (40 mg total) by mouth 2 (two) times daily.   Vilazodone HCl 40 MG Tabs Commonly known as:  VIIBRYD Take 1 tablet (40 mg total) by mouth daily.   XARELTO 20 MG Tabs tablet Generic drug:  rivaroxaban TAKE 1 TABLET BY MOUTH ONCE DAILY.       Follow-up Information    Sanda Linger, MD. Schedule an appointment as soon as possible for a visit in 1 week(s).   Specialty:  Internal Medicine Contact information: [address] [place] [address] [phone]        Dina Rich, MD. Schedule an appointment as soon as possible for a visit in 1 week(s).   Specialty:  Cardiology Contact information: [address] [place] [address] [phone]           Major procedures and Radiology Reports - PLEASE review detailed and final reports thoroughly  -      TTE  Left ventricle: The cavity size was normal. Systolic function wasmildly to moderately reduced. The estimated ejection fraction was   in the range of 40% to 45%. - Ventricular septum: Septal motion showed moderate dyssynergy.   These changes are consistent with intraventricular conduction   delay. - Aortic valve: There was mild regurgitation. - Mitral valve: A bioprosthesis was present and functioning   normally. The sewing ring had no rocking motion and showed no   evidence of dehiscence. - Left atrium:  The atrium was moderately dilated. - Right atrium: The atrium was mildly to moderately dilated.   Dg Abdomen 1 View  Result Date: 05/04/2016 CLINICAL DATA:  Abdominal pain and distention. EXAM: ABDOMEN - 1 VIEW COMPARISON:  None. FINDINGS: The bowel gas pattern is normal. No radio-opaque calculi or other significant radiographic  abnormality are seen. IMPRESSION: Negative. Electronically Signed   By: Gerome Sam III M.D   On: 05/04/2016 22:22   US Venous Img Lower Unilateral Left  Result Date: 04/09/2016 CLINICAL DATA:  Swelling x4-6 weeks, possible Baker's cyst EXAM: LEFT LOWER EXTREMITY VENOUS DOPPLER ULTRASOUND TECHNIQUE: Gray-scale sonography with compression, as well as color and duplex ultrasound, were performed to evaluate the deep venous system from the level of the common femoral vein through the popliteal and proximal calf veins. Sonographer describes technically difficult study of the calf veins secondary to body habitus and edema. COMPARISON:  Contralateral ultrasound 01/24/2015 FINDINGS: Normal compressibility of the common femoral, superficial femoral, and popliteal veins, as well as the proximal calf veins. No filling defects to suggest DVT on grayscale or color Doppler imaging. Doppler waveforms show normal direction of venous flow, normal respiratory phasicity and response to augmentation. Subcutaneous edema in the calf. Survey views of the contralateral common femoral vein are unremarkable. IMPRESSION: 1. No evidence of  lower extremity deep vein thrombosis, left. 2. No Baker's cyst identified. Electronically Signed   By: Corlis Leak M.D.   On: 04/09/2016 12:06   Dg Chest Portable 1 View  Result Date: 05/05/2016 CLINICAL DATA:  Shortness of breath. EXAM: PORTABLE CHEST 1 VIEW COMPARISON:  January 19, 2015 FINDINGS: Cardiomegaly and moderate edema. No other acute abnormalities or changes. Healed right rib fracture. IMPRESSION: Cardiomegaly and moderate edema. No other acute  abnormalities or changes. Electronically Signed   By: Gerome Sam III M.D   On: 05/05/2016 01:15    Micro Results     Recent Results (from the past 240 hour(s))  MRSA PCR Screening     Status: None   Collection Time: 05/05/16  3:04 AM  Result Value Ref Range Status   MRSA by PCR NEGATIVE NEGATIVE Final    Comment:        The GeneXpert MRSA Assay (FDA approved for NASAL specimens only), is one component of a comprehensive MRSA colonization surveillance program. It is not intended to diagnose MRSA infection nor to guide or monitor treatment for MRSA infections.     Today   Subjective    Nykolas Stgermaine today has no headache,no chest abdominal pain,no new weakness tingling or numbness, feels much better wants to go home today.    Objective   Blood pressure (!) 155/70, pulse 86, temperature 98 F (36.7 C), temperature source Oral, resp. rate 18, height 5\' 9"  (1.753 m), weight 131.2 kg (289 lb 3.2 oz), SpO2 98 %.   Intake/Output Summary (Last 24 hours) at 05/08/16 1026 Last data filed at 05/08/16 0522  Gross per 24 hour  Intake              360 ml  Output             1826 ml  Net            -1466 ml    Exam Awake Alert, Oriented x 3, No new F.N deficits, Normal affect [place].AT,PERRAL Supple Neck,No JVD, No cervical lymphadenopathy appriciated.  Symmetrical Chest wall movement, Good air movement bilaterally, CTAB RRR,No Gallops,Rubs , Positive mitral valve systolic murmur, No Parasternal Heave +ve B.Sounds, Abd Soft, Non tender, No organomegaly appriciated, No rebound -guarding or rigidity. No Cyanosis, Clubbing or edema, No new Rash or bruise   Data Review   CBC w Diff: Lab Results  Component Value Date   WBC 9.5 05/05/2016   HGB 13.8 05/05/2016   HCT 42.6 05/05/2016  PLT 153 05/05/2016   LYMPHOPCT 6 05/04/2016   MONOPCT 3 05/04/2016   EOSPCT 1 05/04/2016   BASOPCT 0 05/04/2016    CMP: Lab Results  Component Value Date   NA 135 05/08/2016   K 4.1  05/08/2016   CL 92 (L) 05/08/2016   CO2 36 (H) 05/08/2016   BUN 20 05/08/2016   CREATININE 0.80 05/08/2016   CREATININE 0.81 06/13/2015   PROT 7.2 05/05/2016   ALBUMIN 3.7 05/05/2016   BILITOT 0.7 05/05/2016   ALKPHOS 76 05/05/2016   AST 21 05/05/2016   ALT 23 05/05/2016  .   Total Time in preparing paper work, data evaluation and todays exam - 35 minutes  Leroy SeaSINGH,Xachary Hambly K M.D on 05/08/2016 at 10:26 AM  [facility]   Office  [phone]

## 2016-05-08 NOTE — Discharge Instructions (Signed)
Follow with Primary MD Sanda Linger, MD in 7 days   Get CBC, CMP, Magnesium, 2 view Chest X ray checked  by Primary MD or SNF MD in 5-7 days ( we routinely change or add medications that can affect your baseline labs and fluid status, therefore we recommend that you get the mentioned basic workup next visit with your PCP, your PCP may decide not to get them or add new tests based on their clinical decision)   Activity: As tolerated with Full fall precautions use walker/cane & assistance as needed   Disposition Home     Diet:   Heart Healthy Low Carb,  Check your Weight same time everyday, if you gain over 2 pounds, or you develop in leg swelling, experience more shortness of breath or chest pain, call your Primary MD immediately. Follow Cardiac Low Salt Diet and 1.5 lit/day fluid restriction.   On your next visit with your primary care physician please Get Medicines reviewed and adjusted.   Please request your Prim.MD to go over all Hospital Tests and Procedure/Radiological results at the follow up, please get all Hospital records sent to your Prim MD by signing hospital release before you go home.   If you experience worsening of your admission symptoms, develop shortness of breath, life threatening emergency, suicidal or homicidal thoughts you must seek medical attention immediately by calling 911 or calling your MD immediately  if symptoms less severe.  You Must read complete instructions/literature along with all the possible adverse reactions/side effects for all the Medicines you take and that have been prescribed to you. Take any new Medicines after you have completely understood and accpet all the possible adverse reactions/side effects.   Do not drive, operate heavy machinery, perform activities at heights, swimming or participation in water activities or provide baby sitting services if your were admitted for syncope or siezures until you have seen by Primary MD or a Neurologist and  advised to do so again.  Do not drive when taking Pain medications.    Do not take more than prescribed Pain, Sleep and Anxiety Medications  Special Instructions: If you have smoked or chewed Tobacco  in the last 2 yrs please stop smoking, stop any regular Alcohol  and or any Recreational drug use.  Wear Seat belts while driving.   Please note  You were cared for by a hospitalist during your hospital stay. If you have any questions about your discharge medications or the care you received while you were in the hospital after you are discharged, you can call the unit and asked to speak with the hospitalist on call if the hospitalist that took care of you is not available. Once you are discharged, your primary care physician will handle any further medical issues. Please note that NO REFILLS for any discharge medications will be authorized once you are discharged, as it is imperative that you return to your primary care physician (or establish a relationship with a primary care physician if you do not have one) for your aftercare needs so that they can reassess your need for medications and monitor your lab values.

## 2016-05-08 NOTE — NC FL2 (Signed)
[place] MEDICAID FL2 LEVEL OF CARE SCREENING TOOL     IDENTIFICATION  Patient Name: Cody Hale Birthdate: 1951/01/10 Sex: male Admission Date (Current Location): 05/05/2016  [place] and [place] Number:      ([id number]) Facility and Address:  [facility],  [address] [place] [address]      Provider Number:    Attending Physician Name and Address:  Leroy Sea, MD  Relative Name and Phone Number:       Current Level of Care: Hospital Recommended Level of Care: Assisted Living Facility [facility]) Prior Approval Number:    Date Approved/Denied:   PASRR Number: [id number] [place] ([id number])  Discharge Plan: Home    Current Diagnoses: Patient Active Problem List   Diagnosis Date Noted  . Acute on chronic systolic CHF (congestive heart failure) (HCC) 05/05/2016  . Left leg swelling 03/13/2016  . Allergic rhinitis due to pollen 03/13/2016  . Candida rash of groin 12/12/2015  . Chronic systolic heart failure (HCC) 01/11/2015  . Chronic atrial fibrillation (HCC) 01/11/2015  . S/P mitral valve replacement with bioprosthetic valve 01/11/2015  . Depression with somatization 11/22/2014  . Primary osteoarthritis of both knees 11/22/2014  . COPD (chronic obstructive pulmonary disease) with chronic bronchitis (HCC) 05/12/2014  . B12 deficiency anemia 02/03/2014  . Routine general medical examination at a health care facility 03/21/2013  . Hyperlipidemia with target LDL less than 70 03/19/2013  . OA (osteoarthritis) 03/19/2013  . NSTEMI (non-ST elevated myocardial infarction) (HCC) 03/09/2013  . Essential hypertension, malignant 12/01/2012  . Type II diabetes mellitus with manifestations (HCC)   . Diabetic gastroparesis (HCC) 11/30/2012  . OSA (obstructive sleep apnea) 11/16/2012  . Endocarditis-resolved 08/26/2012  . LBBB (left bundle branch block) 07/03/2012  . Morbid obesity (HCC) 06/27/2012    Orientation RESPIRATION BLADDER Height &  Weight     Self, Time, Situation, Place  O2 (4L) Continent Weight: 289 lb 3.2 oz (131.2 kg) Height:  5\' 9"  (175.3 cm)  BEHAVIORAL SYMPTOMS/MOOD NEUROLOGICAL BOWEL NUTRITION STATUS      Continent Diet (Diet Heart Healthy/carb modified. Fluid restriction 1500 mL fluid)  AMBULATORY STATUS COMMUNICATION OF NEEDS Skin   Independent Verbally Normal                       Personal Care Assistance Level of Assistance  Bathing, Dressing, Feeding Bathing Assistance: Limited assistance Feeding assistance: Independent Dressing Assistance: Limited assistance     Functional Limitations Info  Sight, Hearing, Speech Sight Info: Adequate Hearing Info: Adequate Speech Info: Adequate    SPECIAL CARE FACTORS FREQUENCY                       Contractures Contractures Info: Not present    Additional Factors Info  Code Status, Allergies Code Status Info: Full Allergies Info: Daptomycin, Lisinopril, Tape           Current Medications (05/08/2016):  This is the current hospital active medication list Current Facility-Administered Medications  Medication Dose Route Frequency Provider Last Rate Last Dose  . albuterol (PROVENTIL) (2.5 MG/3ML) 0.083% nebulizer solution 2.5 mg  2.5 mg Nebulization Q4H PRN Bobette Mo, MD      . carvedilol (COREG) tablet 6.25 mg  6.25 mg Oral BID Bobette Mo, MD   6.25 mg at 05/08/16 0931  . furosemide (LASIX) injection 80 mg  80 mg Intravenous BID Clydia Llano, MD   80 mg at 05/08/16 0845  . insulin aspart (  novoLOG) injection 0-15 Units  0-15 Units Subcutaneous TID WC Bobette Moavid Manuel Ortiz, MD   5 Units at 05/08/16 0845  . ipratropium-albuterol (DUONEB) 0.5-2.5 (3) MG/3ML nebulizer solution 3 mL  3 mL Nebulization BID Leroy SeaPrashant K Singh, MD      . magnesium oxide (MAG-OX) tablet 400 mg  400 mg Oral Daily Clydia LlanoMutaz Elmahi, MD   400 mg at 05/08/16 0931  . potassium chloride SA (K-DUR,KLOR-CON) CR tablet 40 mEq  40 mEq Oral Daily Clydia LlanoMutaz Elmahi, MD   40  mEq at 05/08/16 0931  . rivaroxaban (XARELTO) tablet 20 mg  20 mg Oral Daily Bobette Moavid Manuel Ortiz, MD   20 mg at 05/08/16 0931  . simvastatin (ZOCOR) tablet 20 mg  20 mg Oral Daily Bobette Moavid Manuel Ortiz, MD   20 mg at 05/08/16 0931  . sodium chloride flush (NS) 0.9 % injection 3 mL  3 mL Intravenous Q12H Bobette Moavid Manuel Ortiz, MD   3 mL at 05/08/16 1000     Discharge Medications: TAKE these medications   azelastine 0.1 % nasal spray Commonly known as:  ASTELIN Place 1 spray into both nostrils 2 (two) times daily. Use in each nostril as directed  carvedilol 6.25 MG tablet Commonly known as:  COREG TAKE 1 TABLET BY MOUTH TWICE DAILY.  DAILY VITE Tabs TAKE 1 TABLET BY MOUTH ONCE DAILY.  fluticasone 50 MCG/ACT nasal spray Commonly known as:  FLONASE Place 2 sprays into both nostrils daily.  Insulin Glargine 100 UNIT/ML Solostar Pen Commonly known as:  LANTUS SOLOSTAR Inject 30 Units into the skin daily at 10 pm.  metFORMIN 750 MG 24 hr tablet Commonly known as:  GLUCOPHAGE-XR Take 2 tablets (1,500 mg total) by mouth daily with breakfast.  nitroGLYCERIN 0.4 MG SL tablet Commonly known as:  NITROSTAT Place 1 tablet (0.4 mg total) under the tongue every 5 (five) minutes x 3 doses as needed for chest pain.  oxyCODONE 5 MG immediate release tablet Commonly known as:  Oxy IR/ROXICODONE Take 1 tablet (5 mg total) by mouth every 6 (six) hours as needed for severe pain.  potassium chloride 10 MEQ tablet Commonly known as:  K-DUR,KLOR-CON Take 1 tablet (10 mEq total) by mouth daily.  simvastatin 20 MG tablet Commonly known as:  ZOCOR TAKE 1 TABLET BY MOUTH ONCE DAILY.  torsemide 20 MG tablet Commonly known as:  DEMADEX Take 1 tablet (20 mg total) by mouth 2 (two) times daily. What changed:  when to take this  valsartan 40 MG tablet Commonly known as:  DIOVAN Take 1 tablet (40 mg total) by mouth 2 (two) times daily.  Vilazodone HCl 40 MG Tabs Commonly known as:  VIIBRYD Take 1 tablet (40 mg  total) by mouth daily.  XARELTO 20 MG Tabs tablet Generic drug:  rivaroxaban TAKE 1 TABLET BY MOUTH ONCE DAILY.     Relevant Imaging Results:  Relevant Lab Results:   Additional Information    Josalyn Dettmann, Juleen ChinaHeather D, LCSW

## 2016-05-08 NOTE — Clinical Social Work Note (Addendum)
CSW spoke with Lupita Leash at [facility] and advised that patient was ready for discharge. Lupita Leash advised that she would pick patient up later today.  CSW attempted to contact patient's daughter, Meribeth Mattes, at the number listed on the chart. The number was not a working number.    CSW signing off.   Nautika Cressey, Juleen China, LCSW

## 2016-05-08 NOTE — Care Management Important Message (Signed)
Important Message  Patient Details  Name: Cody Hale MRN: [id number] Date of Birth: 1950-08-17   Medicare Important Message Given:  Yes    Shritha Bresee, Chrystine Oiler, RN 05/08/2016, 10:20 AM

## 2016-05-08 NOTE — Discharge Planning (Signed)
Pt was d/c home, vitals stable, no c/o pain, discharge paperwork sent with pt, no questions for the nurse 

## 2016-05-08 NOTE — Evaluation (Signed)
Occupational Therapy Evaluation Patient Details Name: Cody Hale MRN: [id number] DOB: 22-May-1951 Today's Date: 05/08/2016    History of Present Illness Cody Hale is a 65 y.o. male with medical history significant of anxiety, chronic atrial fibrillation on Xarelto 20 mg daily, chronic systolic heart failure, mild nonobstructive CAD, LBBB, PSVT, type 2 diabetes, gastroparesis, DJD, essential hypertension, history of endocarditis treated with Zyvox at [facility] in March/2014, history of prosthetic mitral valve, essential hypertension, morbid obesity, nephrolithiasis, recurrent major depressive disorder who returns for the second time to the emergency department today due to shortness of breath.   Clinical Impression   Pt awake, alert, oriented x4 this am, agreeable to OT evaluation. Pt reports he is feeling better this am compared to admission, no pain reported. Pt requires assistance with bathing, meal preparation, and some dressing at baseline, which staff at [facility] provide. Pt is able to complete ADL tasks in sitting and standing this am at baseline functioning. No further OT services required at this time.     Follow Up Recommendations  No OT follow up    Equipment Recommendations  None recommended by OT       Precautions / Restrictions Precautions Precautions: None Restrictions Weight Bearing Restrictions: No      Mobility Bed Mobility Overal bed mobility: Modified Independent                Transfers Overall transfer level: Needs assistance   Transfers: Sit to/from Stand Sit to Stand: Supervision                   ADL Overall ADL's : Needs assistance/impaired;At baseline Eating/Feeding: Set up                   Lower Body Dressing: Moderate assistance;Sitting/lateral leans Lower Body Dressing Details (indicate cue type and reason): Pt requires assistance for donning socks/shoes due to difficulty reaching feet. At baseline                      Vision Vision Assessment?: No apparent visual deficits          Pertinent Vitals/Pain Pain Assessment: No/denies pain     Hand Dominance Right   Extremity/Trunk Assessment Upper Extremity Assessment Upper Extremity Assessment: Overall WFL for tasks assessed   Lower Extremity Assessment Lower Extremity Assessment: Defer to PT evaluation       Communication Communication Communication: No difficulties   Cognition Arousal/Alertness: Awake/alert Behavior During Therapy: WFL for tasks assessed/performed Overall Cognitive Status: Within Functional Limits for tasks assessed                                Home Living Family/patient expects to be discharged to:: Assisted living                             Home Equipment: [name] - single point;Hospital bed          Prior Functioning/Environment Level of Independence: Needs assistance  Gait / Transfers Assistance Needed: Pt uses SPC for functional mobility purposes ADL's / Homemaking Assistance Needed: Pt has assistance for bathing, dressing as needed, meal preparation            OT Problem List: Cardiopulmonary status limiting activity    End of Session    Activity Tolerance: Patient tolerated treatment well Patient left: in bed;with call  bell/phone within reach   Time: 0822-0837 OT Time Calculation (min): 15 min Charges:  OT General Charges $OT Visit: 1 Procedure OT Evaluation $OT Eval Low Complexity: 1 Procedure Ezra SitesLeslie Kaylin Marcon, OTR/L  [phone] 05/08/2016, 8:41 AM

## 2016-05-08 NOTE — Evaluation (Signed)
Physical Therapy Evaluation Patient Details Name: Cody Hale MRN: [id number] DOB: Apr 14, 1951 Today's Date: 05/08/2016   History of Present Illness  Cody Hale is a 65 y.o. male with medical history significant of anxiety, chronic atrial fibrillation on Xarelto 20 mg daily, chronic systolic heart failure, mild nonobstructive CAD, LBBB, PSVT, type 2 diabetes, gastroparesis, DJD, essential hypertension, history of endocarditis treated with Zyvox at [facility] in March/2014, history of prosthetic mitral valve, essential hypertension, morbid obesity, nephrolithiasis, recurrent major depressive disorder who returns for the second time to the emergency department today due to shortness of breath.  Clinical Impression  Pt received in bed, and was agreeable to PT evaluation.  Pt lives at [facility], and expressed that he receives some assistance for lower body dressing and bathing, but he is normally modified independent with ambulation with a cane.  Pt was able to ambulate 233ft with cane and supervision.  At this point, pt is mobilizing at his baseline, therefore, no skilled need for acute PT, and no PT f/u recommendations at this time.      Follow Up Recommendations No PT follow up    Equipment Recommendations  None recommended by PT    Recommendations for Other Services       Precautions / Restrictions Precautions Precautions: None Precaution Comments: Pt states he has had no falls.  Restrictions Weight Bearing Restrictions: No      Mobility  Bed Mobility Overal bed mobility: Modified Independent                Transfers Overall transfer level: Needs assistance   Transfers: Sit to/from Stand Sit to Stand: Supervision (muptiple trials before he was able to power himself up from seated positin. )            Ambulation/Gait Ambulation/Gait assistance: Supervision Ambulation Distance (Feet): 200 Feet Assistive device: Straight cane Gait  Pattern/deviations: Step-through pattern;Antalgic   Gait velocity interpretation: <1.8 ft/sec, indicative of risk for recurrent falls General Gait Details: Pt reports he feels he is ambulating at his baseline.  No LOB during gait.   Stairs            Wheelchair Mobility    Modified Rankin (Stroke Patients Only)       Balance Overall balance assessment: Needs assistance Sitting-balance support: No upper extremity supported;Feet supported       Standing balance support: Single extremity supported Standing balance-Leahy Scale: Fair                               Pertinent Vitals/Pain Pain Assessment: No/denies pain    Home Living Family/patient expects to be discharged to:: Assisted living Living Arrangements:  (Has a roommate. )   Type of Home: Assisted living [name]) Home Access: Level entry     Home Layout: One level Home Equipment: Cane - single point;Hospital bed (Pt sleeps in a recliner even though he has a hospital bed. )      Prior Function Level of Independence: Needs assistance   Gait / Transfers Assistance Needed: [name] for ambulation - all the time.    ADL's / Homemaking Assistance Needed: Pt states he can dress independently, but they assist with shoes and socks.  Pt requires assistance with some bathing, but does some himself.          Hand Dominance   Dominant Hand: Right    Extremity/Trunk Assessment   Upper Extremity Assessment: Overall [facility]  for tasks assessed           Lower Extremity Assessment: Overall WFL for tasks assessed         Communication   Communication: No difficulties  Cognition Arousal/Alertness: Awake/alert Behavior During Therapy: WFL for tasks assessed/performed Overall Cognitive Status: Within Functional Limits for tasks assessed                      General Comments      Exercises     Assessment/Plan    PT Assessment Patent does not need any further PT services  PT Problem  List            PT Treatment Interventions      PT Goals (Current goals can be found in the Care Plan section)  Acute Rehab PT Goals PT Goal Formulation: All assessment and education complete, DC therapy    Frequency     Barriers to discharge        Co-evaluation               End of Session Equipment Utilized During Treatment: Gait belt Activity Tolerance: Patient tolerated treatment well Patient left: in chair;with call bell/phone within reach Nurse Communication: Mobility status Morrie Sheldon([name], RN notified of pt's mobility status, and that he is now on RA. )    Functional Assessment Tool Used: [employer] AM-PAC "6-clicks"  Functional Limitation: Mobility: Walking and moving around Mobility: Walking and Moving Around Current Status [phone](G8978): At least 20 percent but less than 40 percent impaired, limited or restricted Mobility: Walking and Moving Around Goal Status [phone](G8979): At least 20 percent but less than 40 percent impaired, limited or restricted Mobility: Walking and Moving Around Discharge Status [phone]([phone]): At least 20 percent but less than 40 percent impaired, limited or restricted    Time: 0901-0921 PT Time Calculation (min) (ACUTE ONLY): 20 min   Charges:   PT Evaluation $PT Eval Low Complexity: 1 Procedure     PT G Codes:   PT G-Codes **NOT FOR INPATIENT CLASS** Functional Assessment Tool Used: [employer] AM-PAC "6-clicks"  Functional Limitation: Mobility: Walking and moving around Mobility: Walking and Moving Around Current Status [phone]): At least 20 percent but less than 40 percent impaired, limited or restricted Mobility: Walking and Moving Around Goal Status [phone]([id number]): At least 20 percent but less than 40 percent impaired, limited or restricted Mobility: Walking and Moving Around Discharge Status [phone]([id number]): At least 20 percent but less than 40 percent impaired, limited or restricted    Beth Arturo Freundlich, PT, DPT X: [phone]

## 2016-05-10 ENCOUNTER — Telehealth: Payer: Self-pay | Admitting: *Deleted

## 2016-05-10 NOTE — Telephone Encounter (Signed)
Transition Care Management Follow-up Telephone Call   Date discharged? 05/08/16   How have you been since you were released from the hospital? Pt states he is doing alright   Do you understand why you were in the hospital? YES   Do you understand the discharge instructions? YES   Where were you discharged to? Home   Items Reviewed:  Medications reviewed: YES  Allergies reviewed: YES  Dietary changes reviewed: YES  Referrals reviewed: No referral needed  Functional Questionnaire:   Activities of Daily Living (ADLs):   He states he are independent in the following: ambulation, bathing and hygiene, feeding, continence, grooming, toileting and dressing States he doesn't require assistance   Any transportation issues/concerns?: NO   Any patient concerns? NO   Confirmed importance and date/time of follow-up visits scheduled YES, made 05/21/16  Provider Appointment booked with Dr. Yetta Barre  Confirmed with patient if condition begins to worsen call PCP or go to the ER.  Patient was given the office number and encouraged to call back with question or concerns.  : YES

## 2016-05-12 ENCOUNTER — Emergency Department (HOSPITAL_COMMUNITY)
Admission: EM | Admit: 2016-05-12 | Discharge: 2016-05-12 | Disposition: A | Discharge status: Home / Self Care | Payer: Medicare Other | Attending: Emergency Medicine | Admitting: Emergency Medicine

## 2016-05-12 ENCOUNTER — Encounter (HOSPITAL_COMMUNITY): Payer: Self-pay | Admitting: Emergency Medicine

## 2016-05-12 DIAGNOSIS — Z7984 Long term (current) use of oral hypoglycemic drugs: Secondary | ICD-10-CM | POA: Insufficient documentation

## 2016-05-12 DIAGNOSIS — Z794 Long term (current) use of insulin: Secondary | ICD-10-CM | POA: Diagnosis not present

## 2016-05-12 DIAGNOSIS — J449 Chronic obstructive pulmonary disease, unspecified: Secondary | ICD-10-CM | POA: Diagnosis not present

## 2016-05-12 DIAGNOSIS — R112 Nausea with vomiting, unspecified: Secondary | ICD-10-CM | POA: Diagnosis not present

## 2016-05-12 DIAGNOSIS — I110 Hypertensive heart disease with heart failure: Secondary | ICD-10-CM | POA: Insufficient documentation

## 2016-05-12 DIAGNOSIS — Z87891 Personal history of nicotine dependence: Secondary | ICD-10-CM | POA: Diagnosis not present

## 2016-05-12 DIAGNOSIS — I2510 Atherosclerotic heart disease of native coronary artery without angina pectoris: Secondary | ICD-10-CM | POA: Diagnosis not present

## 2016-05-12 DIAGNOSIS — I5023 Acute on chronic systolic (congestive) heart failure: Secondary | ICD-10-CM | POA: Insufficient documentation

## 2016-05-12 DIAGNOSIS — Z79899 Other long term (current) drug therapy: Secondary | ICD-10-CM | POA: Diagnosis not present

## 2016-05-12 DIAGNOSIS — R1084 Generalized abdominal pain: Secondary | ICD-10-CM | POA: Insufficient documentation

## 2016-05-12 DIAGNOSIS — R197 Diarrhea, unspecified: Secondary | ICD-10-CM | POA: Diagnosis not present

## 2016-05-12 DIAGNOSIS — E119 Type 2 diabetes mellitus without complications: Secondary | ICD-10-CM | POA: Diagnosis not present

## 2016-05-12 LAB — CBC WITH DIFFERENTIAL/PLATELET
Basophils Absolute: 0 10*3/uL (ref 0.0–0.1)
Basophils Relative: 0 %
Eosinophils Absolute: 0 10*3/uL (ref 0.0–0.7)
Eosinophils Relative: 0 %
HCT: 44.6 % (ref 39.0–52.0)
Hemoglobin: 14.7 g/dL (ref 13.0–17.0)
Lymphocytes Relative: 5 %
Lymphs Abs: 0.4 10*3/uL — ABNORMAL LOW (ref 0.7–4.0)
MCH: 30.8 pg (ref 26.0–34.0)
MCHC: 33 g/dL (ref 30.0–36.0)
MCV: 93.5 fL (ref 78.0–100.0)
Monocytes Absolute: 0.6 10*3/uL (ref 0.1–1.0)
Monocytes Relative: 7 %
Neutro Abs: 7.2 10*3/uL (ref 1.7–7.7)
Neutrophils Relative %: 88 %
Platelets: 155 10*3/uL (ref 150–400)
RBC: 4.77 MIL/uL (ref 4.22–5.81)
RDW: 13.4 % (ref 11.5–15.5)
WBC: 8.2 10*3/uL (ref 4.0–10.5)

## 2016-05-12 LAB — URINALYSIS, ROUTINE W REFLEX MICROSCOPIC
Bilirubin Urine: NEGATIVE
Glucose, UA: 250 mg/dL — AB
Hgb urine dipstick: NEGATIVE
Ketones, ur: 40 mg/dL — AB
Leukocytes, UA: NEGATIVE
Nitrite: NEGATIVE
Specific Gravity, Urine: 1.015 (ref 1.005–1.030)
pH: 6 (ref 5.0–8.0)

## 2016-05-12 LAB — COMPREHENSIVE METABOLIC PANEL
ALT: 26 U/L (ref 17–63)
AST: 25 U/L (ref 15–41)
Albumin: 3.9 g/dL (ref 3.5–5.0)
Alkaline Phosphatase: 75 U/L (ref 38–126)
Anion gap: 11 (ref 5–15)
BUN: 29 mg/dL — ABNORMAL HIGH (ref 6–20)
CO2: 31 mmol/L (ref 22–32)
Calcium: 9.3 mg/dL (ref 8.9–10.3)
Chloride: 96 mmol/L — ABNORMAL LOW (ref 101–111)
Creatinine, Ser: 1.11 mg/dL (ref 0.61–1.24)
GFR calc Af Amer: >60 mL/min (ref >60)
GFR calc non Af Amer: >60 mL/min (ref >60)
Glucose, Bld: 282 mg/dL — ABNORMAL HIGH (ref 65–99)
Potassium: 3.6 mmol/L (ref 3.5–5.1)
Sodium: 138 mmol/L (ref 135–145)
Total Bilirubin: 1 mg/dL (ref 0.3–1.2)
Total Protein: 7.2 g/dL (ref 6.5–8.1)

## 2016-05-12 LAB — LIPASE, BLOOD: Lipase: 25 U/L (ref 11–51)

## 2016-05-12 LAB — URINE MICROSCOPIC-ADD ON
Bacteria, UA: NONE SEEN
Squamous Epithelial / LPF: NONE SEEN
WBC, UA: NONE SEEN WBC/hpf (ref 0–5)

## 2016-05-12 NOTE — ED Notes (Signed)
Pt states he ate dinner last night and then later in the evening began having lower abdominal pain, nausea, vomiting, and diarrhea. Pt states abd pain is not present at this time. Pt appears in NAD.

## 2016-05-12 NOTE — ED Triage Notes (Signed)
Pt c/o n/v/d and abd pain since last night.
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
## 2016-05-14 ENCOUNTER — Other Ambulatory Visit: Payer: Self-pay

## 2016-05-14 DIAGNOSIS — M8949 Other hypertrophic osteoarthropathy, multiple sites: Secondary | ICD-10-CM

## 2016-05-14 DIAGNOSIS — M150 Primary generalized (osteo)arthritis: Principal | ICD-10-CM

## 2016-05-14 DIAGNOSIS — M159 Polyosteoarthritis, unspecified: Secondary | ICD-10-CM

## 2016-05-14 NOTE — Progress Notes (Signed)
error 

## 2016-05-16 ENCOUNTER — Telehealth: Payer: Self-pay | Admitting: Internal Medicine

## 2016-05-16 DIAGNOSIS — M79675 Pain in left toe(s): Secondary | ICD-10-CM | POA: Diagnosis not present

## 2016-05-16 DIAGNOSIS — M79674 Pain in right toe(s): Secondary | ICD-10-CM | POA: Diagnosis not present

## 2016-05-16 DIAGNOSIS — B351 Tinea unguium: Secondary | ICD-10-CM | POA: Diagnosis not present

## 2016-05-16 NOTE — Telephone Encounter (Signed)
Is requesting order to process a specimen for UTI.  States patient has been angry and having outburst that is not like him.  Suspect could be UTI.

## 2016-05-16 NOTE — Telephone Encounter (Signed)
Please advise if okay for [facility] to collect and send specimen for UA and reflex culture with sensitivity.   I will fax order to number to number listed.

## 2016-05-20 ENCOUNTER — Inpatient Hospital Stay (HOSPITAL_COMMUNITY)
Admission: EM | Admit: 2016-05-20 | Discharge: 2016-05-24 | DRG: 292 | Disposition: A | Discharge status: Skilled Nursing Facility | Payer: Medicare Other | Attending: Internal Medicine | Admitting: Internal Medicine

## 2016-05-20 ENCOUNTER — Other Ambulatory Visit: Payer: Self-pay | Admitting: *Deleted

## 2016-05-20 ENCOUNTER — Encounter (HOSPITAL_COMMUNITY): Payer: Self-pay | Admitting: *Deleted

## 2016-05-20 ENCOUNTER — Emergency Department (HOSPITAL_COMMUNITY): Payer: Medicare Other

## 2016-05-20 DIAGNOSIS — Z8249 Family history of ischemic heart disease and other diseases of the circulatory system: Secondary | ICD-10-CM | POA: Diagnosis not present

## 2016-05-20 DIAGNOSIS — Z8261 Family history of arthritis: Secondary | ICD-10-CM

## 2016-05-20 DIAGNOSIS — Z888 Allergy status to other drugs, medicaments and biological substances status: Secondary | ICD-10-CM

## 2016-05-20 DIAGNOSIS — R609 Edema, unspecified: Secondary | ICD-10-CM

## 2016-05-20 DIAGNOSIS — I5023 Acute on chronic systolic (congestive) heart failure: Secondary | ICD-10-CM | POA: Diagnosis present

## 2016-05-20 DIAGNOSIS — Z7951 Long term (current) use of inhaled steroids: Secondary | ICD-10-CM | POA: Diagnosis not present

## 2016-05-20 DIAGNOSIS — I482 Chronic atrial fibrillation, unspecified: Secondary | ICD-10-CM | POA: Diagnosis present

## 2016-05-20 DIAGNOSIS — J449 Chronic obstructive pulmonary disease, unspecified: Secondary | ICD-10-CM | POA: Diagnosis not present

## 2016-05-20 DIAGNOSIS — Z794 Long term (current) use of insulin: Secondary | ICD-10-CM

## 2016-05-20 DIAGNOSIS — R600 Localized edema: Secondary | ICD-10-CM

## 2016-05-20 DIAGNOSIS — M1990 Unspecified osteoarthritis, unspecified site: Secondary | ICD-10-CM | POA: Diagnosis present

## 2016-05-20 DIAGNOSIS — I872 Venous insufficiency (chronic) (peripheral): Secondary | ICD-10-CM | POA: Diagnosis present

## 2016-05-20 DIAGNOSIS — Z6841 Body Mass Index (BMI) 40.0 and over, adult: Secondary | ICD-10-CM | POA: Diagnosis not present

## 2016-05-20 DIAGNOSIS — G4733 Obstructive sleep apnea (adult) (pediatric): Secondary | ICD-10-CM | POA: Diagnosis present

## 2016-05-20 DIAGNOSIS — Z87442 Personal history of urinary calculi: Secondary | ICD-10-CM | POA: Diagnosis not present

## 2016-05-20 DIAGNOSIS — Z87891 Personal history of nicotine dependence: Secondary | ICD-10-CM | POA: Diagnosis not present

## 2016-05-20 DIAGNOSIS — R1013 Epigastric pain: Secondary | ICD-10-CM | POA: Diagnosis not present

## 2016-05-20 DIAGNOSIS — E785 Hyperlipidemia, unspecified: Secondary | ICD-10-CM | POA: Diagnosis not present

## 2016-05-20 DIAGNOSIS — I2510 Atherosclerotic heart disease of native coronary artery without angina pectoris: Secondary | ICD-10-CM | POA: Diagnosis present

## 2016-05-20 DIAGNOSIS — Z7901 Long term (current) use of anticoagulants: Secondary | ICD-10-CM

## 2016-05-20 DIAGNOSIS — E118 Type 2 diabetes mellitus with unspecified complications: Secondary | ICD-10-CM | POA: Diagnosis not present

## 2016-05-20 DIAGNOSIS — Z79899 Other long term (current) drug therapy: Secondary | ICD-10-CM

## 2016-05-20 DIAGNOSIS — Z801 Family history of malignant neoplasm of trachea, bronchus and lung: Secondary | ICD-10-CM | POA: Diagnosis not present

## 2016-05-20 DIAGNOSIS — Z9889 Other specified postprocedural states: Secondary | ICD-10-CM | POA: Diagnosis not present

## 2016-05-20 DIAGNOSIS — Z811 Family history of alcohol abuse and dependence: Secondary | ICD-10-CM | POA: Diagnosis not present

## 2016-05-20 DIAGNOSIS — Z953 Presence of xenogenic heart valve: Secondary | ICD-10-CM

## 2016-05-20 DIAGNOSIS — R112 Nausea with vomiting, unspecified: Secondary | ICD-10-CM | POA: Diagnosis not present

## 2016-05-20 DIAGNOSIS — Z8041 Family history of malignant neoplasm of ovary: Secondary | ICD-10-CM

## 2016-05-20 DIAGNOSIS — Z833 Family history of diabetes mellitus: Secondary | ICD-10-CM | POA: Diagnosis not present

## 2016-05-20 DIAGNOSIS — Z6835 Body mass index (BMI) 35.0-35.9, adult: Secondary | ICD-10-CM

## 2016-05-20 DIAGNOSIS — I447 Left bundle-branch block, unspecified: Secondary | ICD-10-CM | POA: Diagnosis present

## 2016-05-20 DIAGNOSIS — I110 Hypertensive heart disease with heart failure: Principal | ICD-10-CM | POA: Diagnosis present

## 2016-05-20 DIAGNOSIS — K3184 Gastroparesis: Secondary | ICD-10-CM

## 2016-05-20 DIAGNOSIS — N200 Calculus of kidney: Secondary | ICD-10-CM | POA: Diagnosis not present

## 2016-05-20 DIAGNOSIS — I5022 Chronic systolic (congestive) heart failure: Secondary | ICD-10-CM

## 2016-05-20 DIAGNOSIS — E1143 Type 2 diabetes mellitus with diabetic autonomic (poly)neuropathy: Secondary | ICD-10-CM | POA: Diagnosis present

## 2016-05-20 LAB — COMPREHENSIVE METABOLIC PANEL
ALT: 30 U/L (ref 17–63)
AST: 25 U/L (ref 15–41)
Albumin: 4.1 g/dL (ref 3.5–5.0)
Alkaline Phosphatase: 71 U/L (ref 38–126)
Anion gap: 10 (ref 5–15)
BUN: 16 mg/dL (ref 6–20)
CO2: 27 mmol/L (ref 22–32)
Calcium: 9.3 mg/dL (ref 8.9–10.3)
Chloride: 102 mmol/L (ref 101–111)
Creatinine, Ser: 1.18 mg/dL (ref 0.61–1.24)
GFR calc Af Amer: >60 mL/min (ref >60)
GFR calc non Af Amer: >60 mL/min (ref >60)
Glucose, Bld: 327 mg/dL — ABNORMAL HIGH (ref 65–99)
Potassium: 3.9 mmol/L (ref 3.5–5.1)
Sodium: 139 mmol/L (ref 135–145)
Total Bilirubin: 0.9 mg/dL (ref 0.3–1.2)
Total Protein: 7.2 g/dL (ref 6.5–8.1)

## 2016-05-20 LAB — CBC WITH DIFFERENTIAL/PLATELET
Basophils Absolute: 0 10*3/uL (ref 0.0–0.1)
Basophils Relative: 0 %
Eosinophils Absolute: 0 10*3/uL (ref 0.0–0.7)
Eosinophils Relative: 0 %
HCT: 43.7 % (ref 39.0–52.0)
Hemoglobin: 14.5 g/dL (ref 13.0–17.0)
Lymphocytes Relative: 6 %
Lymphs Abs: 0.5 10*3/uL — ABNORMAL LOW (ref 0.7–4.0)
MCH: 31.5 pg (ref 26.0–34.0)
MCHC: 33.2 g/dL (ref 30.0–36.0)
MCV: 94.8 fL (ref 78.0–100.0)
Monocytes Absolute: 0.1 10*3/uL (ref 0.1–1.0)
Monocytes Relative: 2 %
Neutro Abs: 7.8 10*3/uL — ABNORMAL HIGH (ref 1.7–7.7)
Neutrophils Relative %: 92 %
Platelets: 128 10*3/uL — ABNORMAL LOW (ref 150–400)
RBC: 4.61 MIL/uL (ref 4.22–5.81)
RDW: 13.5 % (ref 11.5–15.5)
WBC: 8.5 10*3/uL (ref 4.0–10.5)

## 2016-05-20 LAB — TROPONIN I: Troponin I: <0.03 ng/mL (ref <0.03)

## 2016-05-20 LAB — I-STAT CG4 LACTIC ACID, ED: Lactic Acid, Venous: 1.67 mmol/L (ref 0.5–1.9)

## 2016-05-20 LAB — GLUCOSE, CAPILLARY
Glucose-Capillary: 307 mg/dL — ABNORMAL HIGH (ref 65–99)
Glucose-Capillary: 249 mg/dL — ABNORMAL HIGH (ref 65–99)
Glucose-Capillary: 191 mg/dL — ABNORMAL HIGH (ref 65–99)
Glucose-Capillary: 158 mg/dL — ABNORMAL HIGH (ref 65–99)

## 2016-05-20 LAB — LIPASE, BLOOD: Lipase: 30 U/L (ref 11–51)

## 2016-05-20 LAB — TSH: TSH: 0.943 u[IU]/mL (ref 0.350–4.500)

## 2016-05-20 LAB — BRAIN NATRIURETIC PEPTIDE: B Natriuretic Peptide: 144 pg/mL — ABNORMAL HIGH (ref 0.0–100.0)

## 2016-05-20 MED ORDER — CARVEDILOL 3.125 MG PO TABS
6.2500 mg | ORAL_TABLET | Freq: Two times a day (BID) | ORAL | Status: DC
  Administered 2016-05-20 – 2016-05-24 (×9): 6.25 mg via ORAL
  Filled 2016-05-20 (×9): qty 2

## 2016-05-20 MED ORDER — SODIUM CHLORIDE 0.9 % IV BOLUS (SEPSIS)
500.0000 mL | Freq: Once | INTRAVENOUS | Status: AC
  Administered 2016-05-20: 500 mL via INTRAVENOUS

## 2016-05-20 MED ORDER — OXYCODONE HCL 5 MG PO TABS
5.0000 mg | ORAL_TABLET | Freq: Four times a day (QID) | ORAL | Status: DC | PRN
  Administered 2016-05-20 – 2016-05-23 (×3): 5 mg via ORAL
  Filled 2016-05-20 (×4): qty 1

## 2016-05-20 MED ORDER — ONDANSETRON HCL 4 MG/2ML IJ SOLN
4.0000 mg | Freq: Four times a day (QID) | INTRAMUSCULAR | Status: DC | PRN
  Filled 2016-05-20: qty 2

## 2016-05-20 MED ORDER — IRBESARTAN 75 MG PO TABS
37.5000 mg | ORAL_TABLET | Freq: Every day | ORAL | Status: DC
  Administered 2016-05-20 – 2016-05-24 (×5): 37.5 mg via ORAL
  Filled 2016-05-20 (×5): qty 1

## 2016-05-20 MED ORDER — AZELASTINE HCL 0.1 % NA SOLN
1.0000 | Freq: Two times a day (BID) | NASAL | Status: DC
  Administered 2016-05-21 – 2016-05-24 (×7): 1 via NASAL
  Filled 2016-05-20: qty 30

## 2016-05-20 MED ORDER — INSULIN ASPART 100 UNIT/ML ~~LOC~~ SOLN
0.0000 [IU] | Freq: Every day | SUBCUTANEOUS | Status: DC
  Administered 2016-05-22 – 2016-05-23 (×2): 2 [IU] via SUBCUTANEOUS

## 2016-05-20 MED ORDER — INSULIN ASPART 100 UNIT/ML ~~LOC~~ SOLN
0.0000 [IU] | Freq: Three times a day (TID) | SUBCUTANEOUS | Status: DC
Start: 2016-05-20 — End: 2016-05-24
  Administered 2016-05-20: 3 [IU] via SUBCUTANEOUS
  Administered 2016-05-20: 8 [IU] via SUBCUTANEOUS
  Administered 2016-05-21: 3 [IU] via SUBCUTANEOUS
  Administered 2016-05-21: 2 [IU] via SUBCUTANEOUS
  Administered 2016-05-22 (×2): 3 [IU] via SUBCUTANEOUS
  Administered 2016-05-22 – 2016-05-23 (×2): 2 [IU] via SUBCUTANEOUS
  Administered 2016-05-23: 8 [IU] via SUBCUTANEOUS
  Administered 2016-05-23 – 2016-05-24 (×2): 3 [IU] via SUBCUTANEOUS

## 2016-05-20 MED ORDER — SODIUM CHLORIDE 0.9% FLUSH
3.0000 mL | Freq: Two times a day (BID) | INTRAVENOUS | Status: DC
  Administered 2016-05-20 – 2016-05-24 (×9): 3 mL via INTRAVENOUS

## 2016-05-20 MED ORDER — INSULIN GLARGINE 100 UNIT/ML ~~LOC~~ SOLN
30.0000 [IU] | Freq: Every day | SUBCUTANEOUS | Status: DC
  Administered 2016-05-21 – 2016-05-23 (×4): 30 [IU] via SUBCUTANEOUS
  Filled 2016-05-20 (×7): qty 0.3

## 2016-05-20 MED ORDER — METOCLOPRAMIDE HCL 5 MG/ML IJ SOLN
5.0000 mg | Freq: Three times a day (TID) | INTRAMUSCULAR | Status: DC
  Administered 2016-05-20 – 2016-05-24 (×11): 5 mg via INTRAVENOUS
  Filled 2016-05-20 (×11): qty 2

## 2016-05-20 MED ORDER — METOCLOPRAMIDE HCL 5 MG/ML IJ SOLN
10.0000 mg | Freq: Once | INTRAMUSCULAR | Status: AC
  Administered 2016-05-20: 10 mg via INTRAVENOUS
  Filled 2016-05-20: qty 2

## 2016-05-20 MED ORDER — ADULT MULTIVITAMIN W/MINERALS CH
1.0000 | ORAL_TABLET | Freq: Every day | ORAL | Status: DC
  Administered 2016-05-20 – 2016-05-24 (×5): 1 via ORAL
  Filled 2016-05-20 (×5): qty 1

## 2016-05-20 MED ORDER — POTASSIUM CHLORIDE CRYS ER 10 MEQ PO TBCR
10.0000 meq | EXTENDED_RELEASE_TABLET | Freq: Every day | ORAL | Status: DC
  Administered 2016-05-20 – 2016-05-24 (×5): 10 meq via ORAL
  Filled 2016-05-20 (×5): qty 1

## 2016-05-20 MED ORDER — ORAL CARE MOUTH RINSE
15.0000 mL | Freq: Two times a day (BID) | OROMUCOSAL | Status: DC
  Administered 2016-05-20 – 2016-05-24 (×8): 15 mL via OROMUCOSAL

## 2016-05-20 MED ORDER — VILAZODONE HCL 40 MG PO TABS
40.0000 mg | ORAL_TABLET | Freq: Every day | ORAL | Status: DC
  Administered 2016-05-20 – 2016-05-23 (×4): 40 mg via ORAL
  Filled 2016-05-20 (×8): qty 1

## 2016-05-20 MED ORDER — FUROSEMIDE 10 MG/ML IJ SOLN
40.0000 mg | Freq: Two times a day (BID) | INTRAMUSCULAR | Status: DC
  Administered 2016-05-20 – 2016-05-22 (×6): 40 mg via INTRAVENOUS
  Filled 2016-05-20 (×6): qty 4

## 2016-05-20 MED ORDER — MORPHINE SULFATE (PF) 4 MG/ML IV SOLN
4.0000 mg | Freq: Once | INTRAVENOUS | Status: AC
  Administered 2016-05-20: 4 mg via INTRAVENOUS
  Filled 2016-05-20: qty 1

## 2016-05-20 MED ORDER — IOPAMIDOL (ISOVUE-300) INJECTION 61%
INTRAVENOUS | Status: AC
  Administered 2016-05-20: 02:00:00
  Filled 2016-05-20: qty 30

## 2016-05-20 MED ORDER — FLUTICASONE PROPIONATE 50 MCG/ACT NA SUSP
2.0000 | Freq: Every day | NASAL | Status: DC
  Administered 2016-05-20 – 2016-05-24 (×5): 2 via NASAL
  Filled 2016-05-20 (×4): qty 16

## 2016-05-20 MED ORDER — RIVAROXABAN 20 MG PO TABS
20.0000 mg | ORAL_TABLET | Freq: Every day | ORAL | Status: DC
  Administered 2016-05-20 – 2016-05-24 (×5): 20 mg via ORAL
  Filled 2016-05-20 (×5): qty 1

## 2016-05-20 MED ORDER — ONDANSETRON HCL 4 MG PO TABS: 4.0000 mg | ORAL_TABLET | Freq: Four times a day (QID) | ORAL | Status: DC | PRN

## 2016-05-20 MED ORDER — IOPAMIDOL (ISOVUE-300) INJECTION 61%
100.0000 mL | Freq: Once | INTRAVENOUS | Status: AC | PRN
  Administered 2016-05-20: 100 mL via INTRAVENOUS

## 2016-05-20 MED ORDER — SIMVASTATIN 20 MG PO TABS
20.0000 mg | ORAL_TABLET | Freq: Every day | ORAL | Status: DC
  Administered 2016-05-20 – 2016-05-24 (×5): 20 mg via ORAL
  Filled 2016-05-20 (×5): qty 1

## 2016-05-20 MED ORDER — FUROSEMIDE 10 MG/ML IJ SOLN
40.0000 mg | Freq: Once | INTRAMUSCULAR | Status: AC
  Administered 2016-05-20: 40 mg via INTRAVENOUS
  Filled 2016-05-20: qty 4

## 2016-05-20 MED ORDER — METFORMIN HCL ER 500 MG PO TB24
1500.0000 mg | ORAL_TABLET | Freq: Every day | ORAL | Status: DC
Start: 2016-05-20 — End: 2016-05-24
  Administered 2016-05-20 – 2016-05-24 (×5): 1500 mg via ORAL
  Filled 2016-05-20 (×5): qty 3

## 2016-05-20 NOTE — Progress Notes (Signed)
PROGRESS NOTE    Cody Hale  ZOX:[id number] DOB: July 02, 1951 DOA: 05/20/2016 PCP: Sanda Linger, MD    Brief Narrative:  65 y.o.malewith medical history significant of anxiety, chronic atrial fibrillation on Xarelto 20 mg daily, chronic systolic heart failure, mild nonobstructive CAD, LBBB, PSVT, type 2 diabetes, gastroparesis, DJD, essential hypertension, history of endocarditis treated with Zyvox at [facility] in March/2014, history of prosthetic mitral valve, essential hypertension, morbid obesity, nephrolithiasis, recurrent major depressive disorder, recently admitted and discharged about a week ago for acute on chronic systomic CHF, diuresed and discharged on double Demadex doses, presents with abdominal distention, nausea and vomiting.  He had his BM yesterday. Evaluation in the ER with abdominal pelvic CT was unremarkable, and his Lipase was negative.  Serology was unremarkable except for elevated BS.  His CXR however, showed vascular congestion, and his BNP was slightly elevated.  His  Cr was not elevated.  He was also found to have lower oxygen level, and did require supplemental oxygen.  He was given IV Lasix, and hospitalist was asked to admit him for acute on chronic systolic CHF, with possible mild diastolic CHF as well.  He denied indiscretion to salty diet, and said he has been compliant with his medication    Assessment & Plan:   Principal Problem:   Acute on chronic systolic CHF (congestive heart failure) (HCC) Active Problems:   Morbid obesity (HCC)   LBBB (left bundle branch block)   OSA (obstructive sleep apnea)   Type II diabetes mellitus with manifestations (HCC)   Hyperlipidemia with target LDL less than 70   COPD (chronic obstructive pulmonary disease) with chronic bronchitis (HCC)   Chronic atrial fibrillation (HCC)   S/P mitral valve replacement with bioprosthetic valve   Abdominal pain:   -Possibly related to gastroparesis in setting of diabetes.    -Patient has historically reported improvement with scheduled Reglan. Will resume this.  -CT abdomen pelvis reviewed. No significant stool burden. Otherwise unremarkable CT exam  -Patient remains symptomatically, unable to tolerate by mouth.  -Have ordered scheduled Reglan  -Continue clear liquid diet, advance as tolerated   Acute on chronic CHF:   -Patient thus far tolerating IV Lasix  -Unclear what patient's dry weight is, although patient has been as low as 1:15 kilograms approximately 1 year ago. -Current weight 132.6 kg  -Most recent 2-D echocardiogram with EF of 45%.  Afib:   -Stable and rate controlled at present.  -We'll continue Xarelto for secondary stroke prevention  S/p MVR, bioprosthetic: -Remains stable. -Continue current regimen as tolerated.  COPD: -Remains stable. Patient denies chest pain -Continue home regimen for now.  DM:   -Glucose suboptimally controlled overnight with values of 200 to 300s.  -Patient has been continued on metformin  -We'll add sliding scale insulin  DVT prophylaxis: Xarelto Code Status: Full code Family Communication: Patient in room, family not at bedside Disposition Plan: Uncertain at this time  Consultants:     Procedures:     Antimicrobials: Anti-infectives    None       Subjective: Still feels nauseated with abdominal distention  Objective: Vitals:   05/20/16 0415 05/20/16 0430 05/20/16 0500 05/20/16 1349  BP:  155/80  133/84  Pulse:  64  (!) 105  Resp: 22 20  20   Temp:    98.4 F (36.9 C)  TempSrc:    Oral  SpO2:  95%  98%  Weight:   132.7 kg (292 lb 8 oz)   Height:   5'  9" (1.753 m)     Intake/Output Summary (Last 24 hours) at 05/20/16 1523 Last data filed at 05/20/16 1300  Gross per 24 hour  Intake              240 ml  Output              750 ml  Net             -510 ml   Filed Weights   05/20/16 0117 05/20/16 0500  Weight: 131.1 kg (289 lb) 132.7 kg (292 lb 8 oz)     Examination:  General exam: Appears calm and comfortable, Lying in bed  Respiratory system: Clear to auscultation. Respiratory effort normal. Cardiovascular system: S1 & S2 heard, RRR. Gastrointestinal system: Obese, mildly distended, decreased bowel sounds Central nervous system: Alert and oriented. No focal neurological deficits. Extremities: Symmetric 5 x 5 power. Skin: No rashes, lesions Psychiatry: Judgement and insight appear normal. Mood & affect appropriate.   Data Reviewed: I have personally reviewed following labs and imaging studies  CBC:  Recent Labs Lab 05/20/16 0142  WBC 8.5  NEUTROABS 7.8*  HGB 14.5  HCT 43.7  MCV 94.8  PLT 128*   Basic Metabolic Panel:  Recent Labs Lab 05/20/16 0142  NA 139  K 3.9  CL 102  CO2 27  GLUCOSE 327*  BUN 16  CREATININE 1.18  CALCIUM 9.3   GFR: Estimated Creatinine Clearance: 84.3 mL/min (by C-G formula based on SCr of 1.18 mg/dL). Liver Function Tests:  Recent Labs Lab 05/20/16 0142  AST 25  ALT 30  ALKPHOS 71  BILITOT 0.9  PROT 7.2  ALBUMIN 4.1    Recent Labs Lab 05/20/16 0142  LIPASE 30   No results for input(s): AMMONIA in the last 168 hours. Coagulation Profile: No results for input(s): INR, PROTIME in the last 168 hours. Cardiac Enzymes:  Recent Labs Lab 05/20/16 0142  TROPONINI <0.03   BNP (last 3 results)  Recent Labs  10/12/15 0930  PROBNP 157.0*   HbA1C: No results for input(s): HGBA1C in the last 72 hours. CBG:  Recent Labs Lab 05/20/16 0739 05/20/16 1132  GLUCAP 307* 249*   Lipid Profile: No results for input(s): CHOL, HDL, LDLCALC, TRIG, CHOLHDL, LDLDIRECT in the last 72 hours. Thyroid Function Tests:  Recent Labs  05/20/16 0142  TSH 0.943   Anemia Panel: No results for input(s): VITAMINB12, FOLATE, FERRITIN, TIBC, IRON, RETICCTPCT in the last 72 hours. Sepsis Labs:  Recent Labs Lab 05/20/16 0159  LATICACIDVEN 1.67    No results found for this or any  previous visit (from the past 240 hour(s)).   Radiology Studies: Dg Chest 2 View  Result Date: 05/20/2016 CLINICAL DATA:  Nausea and vomiting, onset last night after eating. Epigastric abdominal pain. EXAM: CHEST  2 VIEW COMPARISON:  05/05/2016 FINDINGS: There is unchanged moderate cardiomegaly. Mild hyperinflation. There is extensive interstitial thickening which probably represents interstitial edema. No airspace consolidation. No effusion. Hilar and mediastinal contours are unremarkable and unchanged. There is prior median sternotomy. IMPRESSION: Cardiomegaly and extensive interstitial thickening. This may represent interstitial edema. No consolidation. No effusion. Electronically Signed   By: Ellery Plunkaniel R Mitchell M.D.   On: 05/20/2016 03:17   Ct Abdomen Pelvis W Contrast  Result Date: 05/20/2016 CLINICAL DATA:  Nausea and vomiting, onset last night after eating. Epigastric abdominal pain. EXAM: CT ABDOMEN AND PELVIS WITH CONTRAST TECHNIQUE: Multidetector CT imaging of the abdomen and pelvis was performed using the standard protocol following bolus  administration of intravenous contrast. CONTRAST:  ISOVUE-300 IOPAMIDOL (ISOVUE-300) INJECTION 61%, 1 ISOVUE-300 IOPAMIDOL (ISOVUE-300) INJECTION 61% COMPARISON:  05/25/2011 FINDINGS: Lower chest: Interlobular septal thickening may represent interstitial edema. No pleural or pericardial effusions. Hepatobiliary: No focal liver abnormality is seen. No gallstones, gallbladder wall thickening, or biliary dilatation. Pancreas: Unremarkable. No pancreatic ductal dilatation or surrounding inflammatory changes. Spleen: Normal in size without focal abnormality. Adrenals/Urinary Tract: Adrenals are unremarkable. Lower pole left collecting system calculi measure up to 3 mm. Hypodense renal lesions measure up to 2 point 3 cm, likely cysts although not conclusively characterized. Ureters are unremarkable. Urinary bladder is unremarkable. Stomach/Bowel: Stomach is  within normal limits. Appendix appears normal. No evidence of bowel wall thickening, distention, or inflammatory changes. Vascular/Lymphatic: Aortic atherosclerosis. No enlarged abdominal or pelvic lymph nodes. Reproductive: Unremarkable Other: No acute inflammatory changes are evident in the abdomen or pelvis. There is no ascites. Musculoskeletal: No significant skeletal lesions. No acute bony abnormalities. Moderately severe right hip arthritis. Mild to moderate left hip arthritis. Extensive lumbar degenerative disc and facet disease. IMPRESSION: 1. No acute findings are evident in the abdomen or pelvis. 2. Lung bases exhibit interlobular septal thickening, suggesting interstitial edema. 3. Lower pole left nephrolithiasis, nonobstructing. Electronically Signed   By: Ellery Plunk M.D.   On: 05/20/2016 03:25    Scheduled Meds: . azelastine  1 spray Each Nare BID  . carvedilol  6.25 mg Oral BID  . fluticasone  2 spray Each Nare Daily  . furosemide  40 mg Intravenous BID  . insulin aspart  0-15 Units Subcutaneous TID WC  . insulin aspart  0-5 Units Subcutaneous QHS  . insulin glargine  30 Units Subcutaneous Q2200  . irbesartan  37.5 mg Oral Daily  . mouth rinse  15 mL Mouth Rinse BID  . metFORMIN  1,500 mg Oral Q breakfast  . metoCLOPramide (REGLAN) injection  5 mg Intravenous Q8H  . multivitamin with minerals  1 tablet Oral Daily  . potassium chloride  10 mEq Oral Daily  . rivaroxaban  20 mg Oral Daily  . simvastatin  20 mg Oral Daily  . sodium chloride flush  3 mL Intravenous Q12H  . Vilazodone HCl  40 mg Oral Daily   Continuous Infusions:   LOS: 0 days   CHIU, Scheryl Marten, MD [facility] Pager [phone]  If 7PM-7AM, please contact night-coverage [email or web address] Password [email or web address] 05/20/2016, 3:23 PM

## 2016-05-20 NOTE — Clinical Social Work Note (Signed)
Clinical Social Work Assessment  Patient Details  Name: Cody Hale MRN: [id number] Date of Birth: 15-Apr-1951  Date of referral:  05/20/16               Reason for consult:  Discharge Planning                Permission sought to share information with:  Facility Art therapist granted to share information::  Yes, Verbal Permission Granted  Name::        Agency::  [facility]  Relationship::  facility  Contact Information:     Housing/Transportation Living arrangements for the past 2 months:  [place] of Information:  Patient, Facility Patient Interpreter Needed:  None Criminal Activity/Legal Involvement Pertinent to Current Situation/Hospitalization:  No - Comment as needed Significant Relationships:  Adult Children Lives with:  Facility Resident Do you feel safe going back to the place where you live?  Yes Need for family participation in patient care:  Yes (Comment)  Care giving concerns:  None reported. Pt is long term resident at ALF.    Social Worker assessment / plan:  CSW met with pt at bedside. Pt alert and oriented and states that he is not feeling well this morning. He was just admitted from [facility]. Pt was at church yesterday when he started having nausea and vomiting. Pt's children are involved and supportive. He has been a resident at [facility] for about 2 years. At baseline, pt ambulates with a cane and requires assist with bathing. Pt states he likes being at [facility] and plans to return at d/c. CSW confirmed above with Butch Penny at facility. No home health prior to admission and okay to return.   Employment status:  Retired Forensic scientist:  Medicare PT Recommendations:  Not assessed at this time [place] / Referral to community resources:  Other (Comment Required) (Return to [facility])  Patient/Family's Response to care:  Pt requests to return to [facility] when medically stable.   Patient/Family's Understanding  of and Emotional Response to Diagnosis, Current Treatment, and Prognosis:  Pt is aware of admission diagnosis and treatment plan.   Emotional Assessment Appearance:  Appears stated age Attitude/Demeanor/Rapport:  Other (Cooperative) Affect (typically observed):  Appropriate Orientation:  Oriented to Self, Oriented to Place, Oriented to  Time, Oriented to Situation Alcohol / Substance use:  Not Applicable Psych involvement (Current and /or in the community):  No (Comment)  Discharge Needs  Concerns to be addressed:  Discharge Planning Concerns Readmission within the last 30 days:  Yes Current discharge risk:  None Barriers to Discharge:  Continued Medical Work up   [employer], [employer], [place] 05/20/2016, 9:36 AM [phone]
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
## 2016-05-20 NOTE — ED Triage Notes (Signed)
Pt brought in by [facility] for s/o n/v that started tonight while at church; pt c/o abdominal pain and states he did have one episode of diarrhea;

## 2016-05-20 NOTE — Progress Notes (Signed)
Inpatient Diabetes Program Recommendations  AACE/ADA: New Consensus Statement on Inpatient Glycemic Control (2015)  Target Ranges:  Prepandial:   less than 140 mg/dL      Peak postprandial:   less than 180 mg/dL (1-2 hours)      Critically ill patients:  140 - 180 mg/dL   Results for MAL, GRISCOM (MRN [id number]) as of 05/20/2016 10:01  Ref. Range 05/20/2016 07:39  Glucose-Capillary Latest Ref Range: 65 - 99 mg/dL 409 (H)   Review of Glycemic Control  Diabetes history: DM2 Outpatient Diabetes medications: Metformin XR 1500 mg QAM, Lantus 30 units QHS Current orders for Inpatient glycemic control: Lantus 30 units QHS, Metformin 1500 mg QAM  Inpatient Diabetes Program Recommendations: Correction (SSI): While inpatient, please consider ordering CBGs with Novolog correction scale ACHS.  Thanks, Orlando Penner, RN, MSN, CDE Diabetes Coordinator Inpatient Diabetes Program [phone] (Team Pager from 8am to 5pm)
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
## 2016-05-21 ENCOUNTER — Inpatient Hospital Stay: Payer: Medicare Other | Admitting: Internal Medicine

## 2016-05-21 DIAGNOSIS — K3184 Gastroparesis: Secondary | ICD-10-CM

## 2016-05-21 LAB — GLUCOSE, CAPILLARY
Glucose-Capillary: 176 mg/dL — ABNORMAL HIGH (ref 65–99)
Glucose-Capillary: 131 mg/dL — ABNORMAL HIGH (ref 65–99)
Glucose-Capillary: 110 mg/dL — ABNORMAL HIGH (ref 65–99)
Glucose-Capillary: 133 mg/dL — ABNORMAL HIGH (ref 65–99)

## 2016-05-21 LAB — BASIC METABOLIC PANEL
Anion gap: 6 (ref 5–15)
BUN: 14 mg/dL (ref 6–20)
CO2: 35 mmol/L — ABNORMAL HIGH (ref 22–32)
Calcium: 8.8 mg/dL — ABNORMAL LOW (ref 8.9–10.3)
Chloride: 99 mmol/L — ABNORMAL LOW (ref 101–111)
Creatinine, Ser: 1.03 mg/dL (ref 0.61–1.24)
GFR calc Af Amer: >60 mL/min (ref >60)
GFR calc non Af Amer: >60 mL/min (ref >60)
Glucose, Bld: 171 mg/dL — ABNORMAL HIGH (ref 65–99)
Potassium: 3.8 mmol/L (ref 3.5–5.1)
Sodium: 140 mmol/L (ref 135–145)

## 2016-05-21 NOTE — Plan of Care (Signed)
Problem: Food- and Nutrition-Related Knowledge Deficit (NB-1.1) Goal: Nutrition education Formal process to instruct or train a patient/client in a skill or to impart knowledge to help patients/clients voluntarily manage or modify food choices and eating behavior to maintain or improve health. Outcome: Completed/Met Date Met: 05/21/16 Nutrition Education Note  RD consulted for nutrition education regarding CHF diet. Upon meeting patient, he states that he tries hard to follow the CHF diet, but believes he is falling short, as evidenced by increased edema and multiple recent hospital admissions.   RD provided "Heart Failure Nutrition Therapy" handout from the Academy of [employer] as well as a sheet with pictures of high sodium food items. Reviewed patient's dietary recall. Patient lives at an ALF. As such his meals vary frequently.   In general, for breakfast he has eggs, bacon, sausage and occasionally Hot cereal. He drinks coffee and juice at this meal. (~12 oz) For lunch he gave examples of stewed tomatoes, chicken tenders and some sort of desert. Roughly drinks 16 oz fluid.  Dinner examples included Lasagna, spaghetti, hotdogs. He drinks ~16 oz of fluid at this meal   Patient is already aware of many high sodium foods. He says when they serve hotdogs he will ask for a different item because he knows these are high in sodium. He does the same with deli meats. He avoids gravies, soups and other high sodium products.   RD asked if the foods that were served at the ALF were made fresh or frozen. He says he does not know. Pt says that the food at the facility "is not supposed to have any salt in it", but acknowledges that he does not believe this is true and he can taste salt in many items.  The ALF does not have nutrition information listed. The facility does not have any RD support.  Acknowledged that pt is in a difficult situation. He is in a situation where he may be being served  very salty items without his knowing. He is unable to prepare his own meals and he does not sound to have a lot of family support who could help him with home cooked meals.   RD stated that the sodium content of the foods he is served could vary drastically depending on how they are prepared, what ingredients are used and how they are preserved. He is advised to ask for the nutritional information of the foods that are served; this is the only way he will be able to be certain he is following a low sodium diet. He asked RD to write a note on one of the handouts that he could show the facility.   RD did go over a list of items that will always be high in sodium ie bacon, sausage, soup, cheese, biscuits, deli meats, condiments. He should try to avoid or limit these items. Recommended that he try to make at least half his plate fruits/vegetables. These SHOULD be low in salt, but again stated he would need to look at the information to be sure.   Pt had many appropriate nutrition questions about specific foods and what were good snack options.   RD emphasized the role of fluids. Explained that drinking too many fluids is just as detrimental as eating too much sodium. He says he had been put on a fluid restriction in the past. He believes it was 1500 cc. From his dietary recall. He is meeting this restriction.   Expect Good compliance. Though pt is very  motivated to follow a CHF diet, he is severely limited in the fact that he lives in an ALF that prepares all his meals.   Body mass index is 42.93 kg/m. Pt meets criteria for Morbidly obese based on current BMI.  No further nutrition interventions warranted at this time.  If additional nutrition issues arise, please re-consult RD.   Burtis Junes RD, LDN, CNSC Clinical Nutrition Pager: [phone] 05/21/2016 1:44 PM

## 2016-05-21 NOTE — Progress Notes (Signed)
PROGRESS NOTE    Cody Hale  ONG:[id number] DOB: 02-Apr-1951 DOA: 05/20/2016 PCP: Cody Lingerhomas Jones, MD    Brief Narrative:  65 y.o.malewith medical history significant of anxiety, chronic atrial fibrillation on Xarelto 20 mg daily, chronic systolic heart failure, mild nonobstructive CAD, LBBB, PSVT, type 2 diabetes, gastroparesis, DJD, essential hypertension, history of endocarditis [facility] with [facility] March/2014, history of prosthetic mitral valve, essential hypertension, morbid obesity, nephrolithiasis, recurrent major depressive disorder, recently admitted and discharged about a week ago for acute on chronic systomic CHF, diuresed and discharged on double Demadex doses, presents with abdominal distention, nausea and vomiting.  He had his BM yesterday. Evaluation in the ER with abdominal pelvic CT was unremarkable, and his Lipase was negative.  Serology was unremarkable except for elevated BS.  His CXR however, showed vascular congestion, and his BNP was slightly elevated.  His  Cr was not elevated.  He was also found to have lower oxygen level, and did require supplemental oxygen.  He was given IV Lasix, and hospitalist was asked to admit him for acute on chronic systolic CHF, with possible mild diastolic CHF as well.  He denied indiscretion to salty diet, and said he has been compliant with his medication    Assessment & Plan:   Principal Problem:   Acute on chronic systolic CHF (congestive heart failure) (HCC) Active Problems:   Morbid obesity (HCC)   LBBB (left bundle branch block)   OSA (obstructive sleep apnea)   Type II diabetes mellitus with manifestations (HCC)   Hyperlipidemia with target LDL less than 70   COPD (chronic obstructive pulmonary disease) with chronic bronchitis (HCC)   Chronic atrial fibrillation (HCC)   S/P mitral valve replacement with bioprosthetic valve   Abdominal pain:   -Likely secondary to gastroparesis in setting of diabetes.    -Have started scheduled Reglan with notable improvement. -Patient is tolerating a clear liquid diet, will advance to full liquid diet. Continue to advance diet as tolerated  -Will likely need scheduled Reglan at time of discharge  Acute on chronic CHF:   -Patient is continued on IV Lasix -Unclear what patient's dry weight is, although patient has been as low as 115 kilograms approximately 1 year ago. -Current weight 131.86 kg  -Most recent 2-D echocardiogram with EF of 45%. -Ins and outs reviewed overnight. Net negative only 500 mL. However, patient states that he is voiding very well and admits he is not collecting all of urine he is voiding, thus doubt accuracy of documented ins and outs. -For now, will continue current dose of IV Lasix  Afib:   -Remains stable. Rate controlled currently  -A she continued on Xarelto for secondary stroke prevention  S/p MVR, bioprosthetic: -Currently stable. -We'll continue current regimen for now.  COPD: -Remains stable. Patient denies chest pain -Continue home regimen for now.  DM:   -Overnight glucose trends noted  -Serum glucose in the mid 100s -Continue/scale insulin  DVT prophylaxis: Xarelto Code Status: Full code Family Communication: Patient in room, family not at bedside Disposition Plan: Uncertain at this time  Consultants:     Procedures:     Antimicrobials: Anti-infectives    None      Subjective: Reports feeling better today  Objective: Vitals:   05/20/16 1349 05/20/16 2252 05/21/16 0500 05/21/16 1402  BP: 133/84 (!) 109/48 129/70 (!) 104/56  Pulse: (!) 105 81 79 74  Resp: 20 18 18 20   Temp: 98.4 F (36.9 C) 97.7 F (36.5 C)  97.7 F (36.5 C) 98.4 F (36.9 C)  TempSrc: Oral Oral Oral Oral  SpO2: 98% 99% 99% 99%  Weight:   131.9 kg (290 lb 11.2 oz)   Height:        Intake/Output Summary (Last 24 hours) at 05/21/16 1619 Last data filed at 05/21/16 1403  Gross per 24 hour  Intake             1263  ml  Output              700 ml  Net              563 ml   Filed Weights   05/20/16 0117 05/20/16 0500 05/21/16 0500  Weight: 131.1 kg (289 lb) 132.7 kg (292 lb 8 oz) 131.9 kg (290 lb 11.2 oz)    Examination:  General exam: Appears calm and comfortable, Lying in bed  Respiratory system: Clear to auscultation. Respiratory effort normal. Cardiovascular system: S1 & S2 heard, RRR. Gastrointestinal system: Positive bowel sounds, less distended, no masses Central nervous system: Alert and oriented. No focal neurological deficits. Extremities: Symmetric 5 x 5 power. Skin: No rashes, lesions Psychiatry: Judgement and insight appear normal. Mood & affect appropriate.   Data Reviewed: I have personally reviewed following labs and imaging studies  CBC:  Recent Labs Lab 05/20/16 0142  WBC 8.5  NEUTROABS 7.8*  HGB 14.5  HCT 43.7  MCV 94.8  PLT 128*   Basic Metabolic Panel:  Recent Labs Lab 05/20/16 0142 05/21/16 0634  NA 139 140  K 3.9 3.8  CL 102 99*  CO2 27 35*  GLUCOSE 327* 171*  BUN 16 14  CREATININE 1.18 1.03  CALCIUM 9.3 8.8*   GFR: Estimated Creatinine Clearance: 96.3 mL/min (by C-G formula based on SCr of 1.03 mg/dL). Liver Function Tests:  Recent Labs Lab 05/20/16 0142  AST 25  ALT 30  ALKPHOS 71  BILITOT 0.9  PROT 7.2  ALBUMIN 4.1    Recent Labs Lab 05/20/16 0142  LIPASE 30   No results for input(s): AMMONIA in the last 168 hours. Coagulation Profile: No results for input(s): INR, PROTIME in the last 168 hours. Cardiac Enzymes:  Recent Labs Lab 05/20/16 0142  TROPONINI <0.03   BNP (last 3 results)  Recent Labs  10/12/15 0930  PROBNP 157.0*   HbA1C: No results for input(s): HGBA1C in the last 72 hours. CBG:  Recent Labs Lab 05/20/16 1132 05/20/16 1614 05/20/16 2249 05/21/16 0809 05/21/16 1117  GLUCAP 249* 191* 158* 176* 131*   Lipid Profile: No results for input(s): CHOL, HDL, LDLCALC, TRIG, CHOLHDL, LDLDIRECT in the last  72 hours. Thyroid Function Tests:  Recent Labs  05/20/16 0142  TSH 0.943   Anemia Panel: No results for input(s): VITAMINB12, FOLATE, FERRITIN, TIBC, IRON, RETICCTPCT in the last 72 hours. Sepsis Labs:  Recent Labs Lab 05/20/16 0159  LATICACIDVEN 1.67    No results found for this or any previous visit (from the past 240 hour(s)).   Radiology Studies: Dg Chest 2 View  Result Date: 05/20/2016 CLINICAL DATA:  Nausea and vomiting, onset last night after eating. Epigastric abdominal pain. EXAM: CHEST  2 VIEW COMPARISON:  05/05/2016 FINDINGS: There is unchanged moderate cardiomegaly. Mild hyperinflation. There is extensive interstitial thickening which probably represents interstitial edema. No airspace consolidation. No effusion. Hilar and mediastinal contours are unremarkable and unchanged. There is prior median sternotomy. IMPRESSION: Cardiomegaly and extensive interstitial thickening. This may represent interstitial edema. No consolidation. No effusion. Electronically Signed  By: Ellery Plunk M.D.   On: 05/20/2016 03:17   Ct Abdomen Pelvis W Contrast  Result Date: 05/20/2016 CLINICAL DATA:  Nausea and vomiting, onset last night after eating. Epigastric abdominal pain. EXAM: CT ABDOMEN AND PELVIS WITH CONTRAST TECHNIQUE: Multidetector CT imaging of the abdomen and pelvis was performed using the standard protocol following bolus administration of intravenous contrast. CONTRAST:  ISOVUE-300 IOPAMIDOL (ISOVUE-300) INJECTION 61%, 1 ISOVUE-300 IOPAMIDOL (ISOVUE-300) INJECTION 61% COMPARISON:  05/25/2011 FINDINGS: Lower chest: Interlobular septal thickening may represent interstitial edema. No pleural or pericardial effusions. Hepatobiliary: No focal liver abnormality is seen. No gallstones, gallbladder wall thickening, or biliary dilatation. Pancreas: Unremarkable. No pancreatic ductal dilatation or surrounding inflammatory changes. Spleen: Normal in size without focal abnormality.  Adrenals/Urinary Tract: Adrenals are unremarkable. Lower pole left collecting system calculi measure up to 3 mm. Hypodense renal lesions measure up to 2 point 3 cm, likely cysts although not conclusively characterized. Ureters are unremarkable. Urinary bladder is unremarkable. Stomach/Bowel: Stomach is within normal limits. Appendix appears normal. No evidence of bowel wall thickening, distention, or inflammatory changes. Vascular/Lymphatic: Aortic atherosclerosis. No enlarged abdominal or pelvic lymph nodes. Reproductive: Unremarkable Other: No acute inflammatory changes are evident in the abdomen or pelvis. There is no ascites. Musculoskeletal: No significant skeletal lesions. No acute bony abnormalities. Moderately severe right hip arthritis. Mild to moderate left hip arthritis. Extensive lumbar degenerative disc and facet disease. IMPRESSION: 1. No acute findings are evident in the abdomen or pelvis. 2. Lung bases exhibit interlobular septal thickening, suggesting interstitial edema. 3. Lower pole left nephrolithiasis, nonobstructing. Electronically Signed   By: Ellery Plunk M.D.   On: 05/20/2016 03:25    Scheduled Meds: . azelastine  1 spray Each Nare BID  . carvedilol  6.25 mg Oral BID  . fluticasone  2 spray Each Nare Daily  . furosemide  40 mg Intravenous BID  . insulin aspart  0-15 Units Subcutaneous TID WC  . insulin aspart  0-5 Units Subcutaneous QHS  . insulin glargine  30 Units Subcutaneous Q2200  . irbesartan  37.5 mg Oral Daily  . mouth rinse  15 mL Mouth Rinse BID  . metFORMIN  1,500 mg Oral Q breakfast  . metoCLOPramide (REGLAN) injection  5 mg Intravenous Q8H  . multivitamin with minerals  1 tablet Oral Daily  . potassium chloride  10 mEq Oral Daily  . rivaroxaban  20 mg Oral Daily  . simvastatin  20 mg Oral Daily  . sodium chloride flush  3 mL Intravenous Q12H  . Vilazodone HCl  40 mg Oral Daily   Continuous Infusions:   LOS: 1 day   Pratyush Ammon, Scheryl Marten, MD [facility] Pager [phone]  If 7PM-7AM, please contact night-coverage [email or web address] Password [email or web address] 05/21/2016, 4:19 PM

## 2016-05-22 ENCOUNTER — Encounter: Payer: Self-pay | Admitting: Physician Assistant

## 2016-05-22 DIAGNOSIS — E785 Hyperlipidemia, unspecified: Secondary | ICD-10-CM

## 2016-05-22 DIAGNOSIS — Z794 Long term (current) use of insulin: Secondary | ICD-10-CM

## 2016-05-22 DIAGNOSIS — I482 Chronic atrial fibrillation: Secondary | ICD-10-CM

## 2016-05-22 DIAGNOSIS — I5023 Acute on chronic systolic (congestive) heart failure: Secondary | ICD-10-CM

## 2016-05-22 DIAGNOSIS — E118 Type 2 diabetes mellitus with unspecified complications: Secondary | ICD-10-CM

## 2016-05-22 DIAGNOSIS — J449 Chronic obstructive pulmonary disease, unspecified: Secondary | ICD-10-CM

## 2016-05-22 LAB — GLUCOSE, CAPILLARY
Glucose-Capillary: 181 mg/dL — ABNORMAL HIGH (ref 65–99)
Glucose-Capillary: 155 mg/dL — ABNORMAL HIGH (ref 65–99)
Glucose-Capillary: 143 mg/dL — ABNORMAL HIGH (ref 65–99)
Glucose-Capillary: 245 mg/dL — ABNORMAL HIGH (ref 65–99)

## 2016-05-22 LAB — BASIC METABOLIC PANEL
Anion gap: 7 (ref 5–15)
BUN: 15 mg/dL (ref 6–20)
CO2: 35 mmol/L — ABNORMAL HIGH (ref 22–32)
Calcium: 9 mg/dL (ref 8.9–10.3)
Chloride: 95 mmol/L — ABNORMAL LOW (ref 101–111)
Creatinine, Ser: 0.88 mg/dL (ref 0.61–1.24)
GFR calc Af Amer: >60 mL/min (ref >60)
GFR calc non Af Amer: >60 mL/min (ref >60)
Glucose, Bld: 173 mg/dL — ABNORMAL HIGH (ref 65–99)
Potassium: 3.7 mmol/L (ref 3.5–5.1)
Sodium: 137 mmol/L (ref 135–145)

## 2016-05-22 MED ORDER — FUROSEMIDE 10 MG/ML IJ SOLN
80.0000 mg | Freq: Two times a day (BID) | INTRAMUSCULAR | Status: DC
  Administered 2016-05-23 – 2016-05-24 (×3): 80 mg via INTRAVENOUS
  Filled 2016-05-22 (×3): qty 8

## 2016-05-22 NOTE — Progress Notes (Signed)
PROGRESS NOTE    Cody Hale  EGB:[id number] DOB: Aug 19, 1950 DOA: 05/20/2016 PCP: Sanda Linger, MD    Brief Narrative:  72 yom with a history of afib on xarelto, CHF, CAD, PSVT, LBBB, type 2 DM, gastroparesis, DJD, Mitral valve, obesity, nephrolithiasis, and HTN, presented with cp,[;atis of abdominal distension, nausea, and vomiting.  He was recently admitted for CHF in which he was diuresed and discharged on double Demadex doses. While in the ED, serology was unremarkable except for elevated BS.b Abdominal work-up was negative. CXR showed vascular congestion. He was started on lasix and admitted for further evaluation of chronic systolic CHF.   Assessment & Plan:   Principal Problem:   Acute on chronic systolic CHF (congestive heart failure) (HCC) Active Problems:   Morbid obesity (HCC)   LBBB (left bundle branch block)   OSA (obstructive sleep apnea)   Type II diabetes mellitus with manifestations (HCC)   Hyperlipidemia with target LDL less than 70   COPD (chronic obstructive pulmonary disease) with chronic bronchitis (HCC)   Chronic atrial fibrillation (HCC)   S/P mitral valve replacement with bioprosthetic valve  1. Abdominal pain. This is likely secondary to gastroparesis in setting of diabetes. Reports improvement with Reglan. Continue to advance diet.  2. Acute on on chronic systolic CHF. He has a past echocardiogram last month with an EF of 40-45%. Unclear of patients dry weight, although patient has been as low as 115 kilograms approximately 1 year ago. Current weight is 131.86 kg. Continue on lasix, monitor intake and output.  3. A-fib. He is anticoagulated on xarelto, will continue. Rate is controlled.   4. S/p MVR, biprosthetic. Stable at this time. Continue current regimen.  5. COPD. Compensated at this time. Continue supplemental oxygen.  6. HTN. Pressures are stable. Continue metformin.  7. Dm type 2. Glucose is stable. Continue SSI.   DVT prophylaxis: Xarelto    Code Status: FULL  Family Communication: No family bedside Disposition Plan: Discharge home once improved.    Consultants:   None   Procedures:   None   Antimicrobials:   None    Subjective: Nausea and vomiting are improving. Wants to advance diet. Feels that peripheral edema is also improving  Objective: Vitals:   05/21/16 0500 05/21/16 1402 05/21/16 2100 05/22/16 0500  BP: 129/70 (!) 104/56 99/69 110/63  Pulse: 79 74 74 72  Resp: 18 20 20 20   Temp: 97.7 F (36.5 C) 98.4 F (36.9 C) 98.4 F (36.9 C) 97.9 F (36.6 C)  TempSrc: Oral Oral Oral Oral  SpO2: 99% 99% 98% 99%  Weight: 131.9 kg (290 lb 11.2 oz)   131.7 kg (290 lb 5.5 oz)  Height:        Intake/Output Summary (Last 24 hours) at 05/22/16 0837 Last data filed at 05/22/16 0801  Gross per 24 hour  Intake              963 ml  Output             1500 ml  Net             -537 ml   Filed Weights   05/20/16 0500 05/21/16 0500 05/22/16 0500  Weight: 132.7 kg (292 lb 8 oz) 131.9 kg (290 lb 11.2 oz) 131.7 kg (290 lb 5.5 oz)    Examination:  General exam: Appears calm and comfortable  Respiratory system: Clear to auscultation. Respiratory effort normal. Cardiovascular system: S1 & S2 heard, RRR. No JVD, murmurs, rubs, gallops or  clicks. 1+ pedal edema. Gastrointestinal system: Abdomen is nondistended, soft and nontender. No organomegaly or masses felt. Normal bowel sounds heard. Central nervous system: Alert and oriented. No focal neurological deficits. Extremities: Symmetric 5 x 5 power. Skin: No rashes, lesions or ulcers Psychiatry: Judgement and insight appear normal. Mood & affect appropriate.     Data Reviewed: I have personally reviewed following labs and imaging studies  CBC:  Recent Labs Lab 05/20/16 0142  WBC 8.5  NEUTROABS 7.8*  HGB 14.5  HCT 43.7  MCV 94.8  PLT 128*   Basic Metabolic Panel:  Recent Labs Lab 05/20/16 0142 05/21/16 0634 05/22/16 0720  NA 139 140 137  K 3.9 3.8  3.7  CL 102 99* 95*  CO2 27 35* 35*  GLUCOSE 327* 171* 173*  BUN 16 14 15   CREATININE 1.18 1.03 0.88  CALCIUM 9.3 8.8* 9.0   GFR: Estimated Creatinine Clearance: 112.6 mL/min (by C-G formula based on SCr of 0.88 mg/dL). Liver Function Tests:  Recent Labs Lab 05/20/16 0142  AST 25  ALT 30  ALKPHOS 71  BILITOT 0.9  PROT 7.2  ALBUMIN 4.1    Recent Labs Lab 05/20/16 0142  LIPASE 30   No results for input(s): AMMONIA in the last 168 hours. Coagulation Profile: No results for input(s): INR, PROTIME in the last 168 hours. Cardiac Enzymes:  Recent Labs Lab 05/20/16 0142  TROPONINI <0.03   BNP (last 3 results)  Recent Labs  10/12/15 0930  PROBNP 157.0*   HbA1C: No results for input(s): HGBA1C in the last 72 hours. CBG:  Recent Labs Lab 05/21/16 0809 05/21/16 1117 05/21/16 1624 05/21/16 2052 05/22/16 0739  GLUCAP 176* 131* 110* 133* 181*   Lipid Profile: No results for input(s): CHOL, HDL, LDLCALC, TRIG, CHOLHDL, LDLDIRECT in the last 72 hours. Thyroid Function Tests:  Recent Labs  05/20/16 0142  TSH 0.943   Anemia Panel: No results for input(s): VITAMINB12, FOLATE, FERRITIN, TIBC, IRON, RETICCTPCT in the last 72 hours. Sepsis Labs:  Recent Labs Lab 05/20/16 0159  LATICACIDVEN 1.67    No results found for this or any previous visit (from the past 240 hour(s)).       Radiology Studies: No results found.      Scheduled Meds: . azelastine  1 spray Each Nare BID  . carvedilol  6.25 mg Oral BID  . fluticasone  2 spray Each Nare Daily  . furosemide  40 mg Intravenous BID  . insulin aspart  0-15 Units Subcutaneous TID WC  . insulin aspart  0-5 Units Subcutaneous QHS  . insulin glargine  30 Units Subcutaneous Q2200  . irbesartan  37.5 mg Oral Daily  . mouth rinse  15 mL Mouth Rinse BID  . metFORMIN  1,500 mg Oral Q breakfast  . metoCLOPramide (REGLAN) injection  5 mg Intravenous Q8H  . multivitamin with minerals  1 tablet Oral Daily    . potassium chloride  10 mEq Oral Daily  . rivaroxaban  20 mg Oral Daily  . simvastatin  20 mg Oral Daily  . sodium chloride flush  3 mL Intravenous Q12H  . Vilazodone HCl  40 mg Oral Daily   Continuous Infusions:   LOS: 2 days    Time spent: 25 minutes     Erick BlinksJehanzeb Sable Knoles, MD [facility] \\If  7PM-7AM, please contact night-coverage [email or web address] Password [email or web address] 05/22/2016, 8:37 AM

## 2016-05-23 DIAGNOSIS — Z953 Presence of xenogenic heart valve: Secondary | ICD-10-CM

## 2016-05-23 LAB — BASIC METABOLIC PANEL
Anion gap: 8 (ref 5–15)
BUN: 12 mg/dL (ref 6–20)
CO2: 33 mmol/L — ABNORMAL HIGH (ref 22–32)
Calcium: 9 mg/dL (ref 8.9–10.3)
Chloride: 94 mmol/L — ABNORMAL LOW (ref 101–111)
Creatinine, Ser: 0.87 mg/dL (ref 0.61–1.24)
GFR calc Af Amer: >60 mL/min (ref >60)
GFR calc non Af Amer: >60 mL/min (ref >60)
Glucose, Bld: 188 mg/dL — ABNORMAL HIGH (ref 65–99)
Potassium: 3.4 mmol/L — ABNORMAL LOW (ref 3.5–5.1)
Sodium: 135 mmol/L (ref 135–145)

## 2016-05-23 LAB — GLUCOSE, CAPILLARY
Glucose-Capillary: 153 mg/dL — ABNORMAL HIGH (ref 65–99)
Glucose-Capillary: 149 mg/dL — ABNORMAL HIGH (ref 65–99)
Glucose-Capillary: 276 mg/dL — ABNORMAL HIGH (ref 65–99)
Glucose-Capillary: 213 mg/dL — ABNORMAL HIGH (ref 65–99)

## 2016-05-23 MED ORDER — POTASSIUM CHLORIDE CRYS ER 20 MEQ PO TBCR
40.0000 meq | EXTENDED_RELEASE_TABLET | ORAL | Status: AC
  Administered 2016-05-23 (×2): 40 meq via ORAL
  Filled 2016-05-23 (×2): qty 2

## 2016-05-23 NOTE — Progress Notes (Signed)
PROGRESS NOTE    Cody Hale  NWG:[id number] DOB: 19-Jan-1951 DOA: 05/20/2016 PCP: Sanda Lingerhomas Jones, MD    Brief Narrative:  [age] yom with a history of afib on xarelto, CHF, CAD, PSVT, LBBB, type 2 DM, gastroparesis, DJD, Mitral valve, obesity, nephrolithiasis, and HTN, presented with cp,[;atis of abdominal distension, nausea, and vomiting.  He was recently admitted for CHF in which he was diuresed and discharged on double Demadex doses. While in the ED, serology was unremarkable except for elevated BS.b Abdominal work-up was negative. CXR showed vascular congestion. He was started on lasix and admitted for further evaluation of acute on chronic systolic CHF.  Assessment & Plan:   Principal Problem:   Acute on chronic systolic CHF (congestive heart failure) (HCC) Active Problems:   Morbid obesity (HCC)   LBBB (left bundle branch block)   OSA (obstructive sleep apnea)   Type II diabetes mellitus with manifestations (HCC)   Hyperlipidemia with target LDL less than 70   COPD (chronic obstructive pulmonary disease) with chronic bronchitis (HCC)   Chronic atrial fibrillation (HCC)   S/P mitral valve replacement with bioprosthetic valve  1. Abdominal pain. This is likely secondary togastroparesis in setting of diabetes. Reports improvement with Reglan. Continue to advance diet.  2. Acute on on chronic systolic CHF. He has had an echocardiogram last month with an EF of 40-45%. Unclear of patients dry weight, although patient has been as low as 115 kilograms approximately 1 year ago. Current weight is 131.86 kg. Still has some evidence of volume overload. Urine output has improved with higher dose of lasix. Continue on lasix, monitor intake and output.  3. A-fib. He is anticoagulated on xarelto, will continue. Rate is controlled.   4. S/p MVR, biprosthetic. Stable at this time. Continue current regimen.  5. COPD. Compensated at this time. Continue supplemental oxygen.  6. HTN. Pressures are  stable. Continue metformin. 7. Dm type 2. Glucose is stable. Continue SSI.   DVT prophylaxis: Xarelto  Code Status: FULL  Family Communication: No family bedside Disposition Plan: Discharge to ALF once improved. Possibly in AM    Consultants:   None   Procedures:   None   Antimicrobials:   None    Subjective: Complains of continued swelling in legs. Had "rough night". No chest pain  Objective: Vitals:   05/22/16 0500 05/22/16 1548 05/22/16 2215 05/23/16 0557  BP: 110/63 (!) 152/64 (!) 114/57 115/61  Pulse: 72 (!) 127 71 79  Resp: 20 20 20 15   Temp: 97.9 F (36.6 C) 97.7 F (36.5 C) 97.5 F (36.4 C)   TempSrc: Oral Oral Oral Oral  SpO2: 99% 100% 95% 94%  Weight: 131.7 kg (290 lb 5.5 oz)   130.5 kg (287 lb 9.6 oz)  Height:        Intake/Output Summary (Last 24 hours) at 05/23/16 0825 Last data filed at 05/23/16 0759  Gross per 24 hour  Intake              363 ml  Output             1901 ml  Net            -1538 ml   Filed Weights   05/21/16 0500 05/22/16 0500 05/23/16 0557  Weight: 131.9 kg (290 lb 11.2 oz) 131.7 kg (290 lb 5.5 oz) 130.5 kg (287 lb 9.6 oz)    Examination:  General exam: Appears calm and comfortable  Respiratory system: Clear to auscultation. Respiratory effort normal. Cardiovascular  system: S1 & S2 heard, RRR. No JVD, murmurs, rubs, gallops or clicks. 1+ pedal edema. Gastrointestinal system: Abdomen is nondistended, soft and nontender. No organomegaly or masses felt. Normal bowel sounds heard. Central nervous system: Alert and oriented. No focal neurological deficits. Extremities: Symmetric 5 x 5 power. Skin: No rashes, lesions or ulcers Psychiatry: Judgement and insight appear normal. Mood & affect appropriate.     Data Reviewed: I have personally reviewed following labs and imaging studies  CBC:  Recent Labs Lab 05/20/16 0142  WBC 8.5  NEUTROABS 7.8*  HGB 14.5  HCT 43.7  MCV 94.8  PLT 128*   Basic Metabolic  Panel:  Recent Labs Lab 05/20/16 0142 05/21/16 0634 05/22/16 0720 05/23/16 0654  NA 139 140 137 135  K 3.9 3.8 3.7 3.4*  CL 102 99* 95* 94*  CO2 27 35* 35* 33*  GLUCOSE 327* 171* 173* 188*  BUN 16 14 15 12   CREATININE 1.18 1.03 0.88 0.87  CALCIUM 9.3 8.8* 9.0 9.0   GFR: Estimated Creatinine Clearance: 113.3 mL/min (by C-G formula based on SCr of 0.87 mg/dL). Liver Function Tests:  Recent Labs Lab 05/20/16 0142  AST 25  ALT 30  ALKPHOS 71  BILITOT 0.9  PROT 7.2  ALBUMIN 4.1    Recent Labs Lab 05/20/16 0142  LIPASE 30   No results for input(s): AMMONIA in the last 168 hours. Coagulation Profile: No results for input(s): INR, PROTIME in the last 168 hours. Cardiac Enzymes:  Recent Labs Lab 05/20/16 0142  TROPONINI <0.03   BNP (last 3 results)  Recent Labs  10/12/15 0930  PROBNP 157.0*   HbA1C: No results for input(s): HGBA1C in the last 72 hours. CBG:  Recent Labs Lab 05/22/16 0739 05/22/16 1136 05/22/16 1634 05/22/16 2217 05/23/16 0817  GLUCAP 181* 155* 143* 245* 153*   Lipid Profile: No results for input(s): CHOL, HDL, LDLCALC, TRIG, CHOLHDL, LDLDIRECT in the last 72 hours. Thyroid Function Tests: No results for input(s): TSH, T4TOTAL, FREET4, T3FREE, THYROIDAB in the last 72 hours. Anemia Panel: No results for input(s): VITAMINB12, FOLATE, FERRITIN, TIBC, IRON, RETICCTPCT in the last 72 hours. Sepsis Labs:  Recent Labs Lab 05/20/16 0159  LATICACIDVEN 1.67    No results found for this or any previous visit (from the past 240 hour(s)).       Radiology Studies: No results found.      Scheduled Meds: . azelastine  1 spray Each Nare BID  . carvedilol  6.25 mg Oral BID  . fluticasone  2 spray Each Nare Daily  . furosemide  80 mg Intravenous BID  . insulin aspart  0-15 Units Subcutaneous TID WC  . insulin aspart  0-5 Units Subcutaneous QHS  . insulin glargine  30 Units Subcutaneous Q2200  . irbesartan  37.5 mg Oral Daily   . mouth rinse  15 mL Mouth Rinse BID  . metFORMIN  1,500 mg Oral Q breakfast  . metoCLOPramide (REGLAN) injection  5 mg Intravenous Q8H  . multivitamin with minerals  1 tablet Oral Daily  . potassium chloride  10 mEq Oral Daily  . rivaroxaban  20 mg Oral Daily  . simvastatin  20 mg Oral Daily  . sodium chloride flush  3 mL Intravenous Q12H  . Vilazodone HCl  40 mg Oral Daily   Continuous Infusions:   LOS: 3 days    Time spent: 25 minutes     Erick Blinks, MD [facility] If 7PM-7AM, please contact night-coverage [email or web address] Password [email or web address] 05/23/2016, 8:25  AM

## 2016-05-24 LAB — BASIC METABOLIC PANEL
Anion gap: 9 (ref 5–15)
BUN: 14 mg/dL (ref 6–20)
CO2: 32 mmol/L (ref 22–32)
Calcium: 9.1 mg/dL (ref 8.9–10.3)
Chloride: 95 mmol/L — ABNORMAL LOW (ref 101–111)
Creatinine, Ser: 0.96 mg/dL (ref 0.61–1.24)
GFR calc Af Amer: >60 mL/min (ref >60)
GFR calc non Af Amer: >60 mL/min (ref >60)
Glucose, Bld: 187 mg/dL — ABNORMAL HIGH (ref 65–99)
Potassium: 3.9 mmol/L (ref 3.5–5.1)
Sodium: 136 mmol/L (ref 135–145)

## 2016-05-24 LAB — GLUCOSE, CAPILLARY
Glucose-Capillary: 182 mg/dL — ABNORMAL HIGH (ref 65–99)
Glucose-Capillary: 236 mg/dL — ABNORMAL HIGH (ref 65–99)

## 2016-05-24 MED ORDER — METOCLOPRAMIDE HCL 5 MG PO TABS: 5.0000 mg | ORAL_TABLET | Freq: Four times a day (QID) | ORAL | 0 refills | Status: DC | PRN

## 2016-05-24 MED ORDER — TORSEMIDE 20 MG PO TABS: 40.0000 mg | ORAL_TABLET | Freq: Two times a day (BID) | ORAL | 0 refills | Status: DC

## 2016-05-24 NOTE — Care Management Important Message (Signed)
Important Message  Patient Details  Name: Cody Hale MRN: [id number] Date of Birth: 28-Dec-1950   Medicare Important Message Given:  Yes    Landen Knoedler, Chrystine Oiler, RN 05/24/2016, 10:03 AM

## 2016-05-24 NOTE — Clinical Social Work Note (Signed)
Pt d/c today back to [facility]. Pt and facility aware and agreeable. Facility to provide transport. Pt states he will notify family later today and no need for CSW to call.  Derenda Fennel, LCSW [phone]

## 2016-05-24 NOTE — NC FL2 (Signed)
[place] MEDICAID FL2 LEVEL OF CARE SCREENING TOOL     IDENTIFICATION  Patient Name: Cody Hale Birthdate: 23-Apr-1951 Sex: male Admission Date (Current Location): 05/20/2016  [facility] [place]   Facility and Address:  [facility],  [address], [place] [id number]      Provider Number: [id number]  Attending Physician Name and Address:  Erick BlinksJehanzeb Memon, MD  Relative Name and Phone Number:       Current Level of Care: Hospital Recommended Level of Care: Assisted Living Facility Prior Approval Number:    Date Approved/Denied:   PASRR Number: [id number]  Discharge Plan: Other (Comment) (ALF)    Current Diagnoses: Patient Active Problem List   Diagnosis Date Noted  . Acute on chronic systolic CHF (congestive heart failure) (HCC) 05/05/2016  . Left leg swelling 03/13/2016  . Allergic rhinitis due to pollen 03/13/2016  . Candida rash of groin 12/12/2015  . Chronic systolic heart failure (HCC) 01/11/2015  . Chronic atrial fibrillation (HCC) 01/11/2015  . S/P mitral valve replacement with bioprosthetic valve 01/11/2015  . Depression with somatization 11/22/2014  . Primary osteoarthritis of both knees 11/22/2014  . COPD (chronic obstructive pulmonary disease) with chronic bronchitis (HCC) 05/12/2014  . B12 deficiency anemia 02/03/2014  . Routine general medical examination at a health care facility 03/21/2013  . Hyperlipidemia with target LDL less than 70 03/19/2013  . OA (osteoarthritis) 03/19/2013  . NSTEMI (non-ST elevated myocardial infarction) (HCC) 03/09/2013  . Essential hypertension, malignant 12/01/2012  . Type II diabetes mellitus with manifestations (HCC)   . Gastroparesis 11/30/2012  . OSA (obstructive sleep apnea) 11/16/2012  . Endocarditis-resolved 08/26/2012  . LBBB (left bundle branch block) 07/03/2012  . Morbid obesity (HCC) 06/27/2012    Orientation RESPIRATION BLADDER Height & Weight     Self, Time,  Situation, Place  Normal Continent Weight: 285 lb 6.3 oz (129.5 kg) Height:  5\' 9"  (175.3 cm)  BEHAVIORAL SYMPTOMS/MOOD NEUROLOGICAL BOWEL NUTRITION STATUS  Other (Comment) (none)  (n/a) Continent Diet (Low sodium heart healthy/carb modified)  AMBULATORY STATUS COMMUNICATION OF NEEDS Skin   Limited Assist Verbally Normal                       Personal Care Assistance Level of Assistance  Bathing, Feeding, Dressing Bathing Assistance: Limited assistance Feeding assistance: Independent Dressing Assistance: Limited assistance     Functional Limitations Info  Sight, Hearing, Speech Sight Info: Adequate Hearing Info: Adequate Speech Info: Adequate    SPECIAL CARE FACTORS FREQUENCY                       Contractures      Additional Factors Info  Code Status, Allergies Code Status Info: Full code Allergies Info: Daptomycin, Lisinopril, Tape           Current Medications (05/24/2016):  This is the current hospital active medication list Current Facility-Administered Medications  Medication Dose Route Frequency Provider Last Rate Last Dose  . azelastine (ASTELIN) 0.1 % nasal spray 1 spray  1 spray Each Nare BID Houston SirenPeter Le, MD   1 spray at 05/24/16 0916  . carvedilol (COREG) tablet 6.25 mg  6.25 mg Oral BID Houston SirenPeter Le, MD   6.25 mg at 05/24/16 0915  . fluticasone (FLONASE) 50 MCG/ACT nasal spray 2 spray  2 spray Each Nare Daily Houston SirenPeter Le, MD   2 spray at 05/24/16 0915  . furosemide (LASIX) injection 80 mg  80 mg Intravenous  BID Erick Blinks, MD   80 mg at 05/24/16 0913  . insulin aspart (novoLOG) injection 0-15 Units  0-15 Units Subcutaneous TID WC Jerald Kief, MD   3 Units at 05/24/16 0915  . insulin aspart (novoLOG) injection 0-5 Units  0-5 Units Subcutaneous QHS Jerald Kief, MD   2 Units at 05/23/16 2224  . insulin glargine (LANTUS) injection 30 Units  30 Units Subcutaneous Q2200 [name], MD   30 Units at 05/23/16 2224  . irbesartan (AVAPRO) tablet 37.5 mg   37.5 mg Oral Daily [name], MD   37.5 mg at 05/24/16 0914  . MEDLINE mouth rinse  15 mL Mouth Rinse BID [name], MD   15 mL at 05/24/16 1000  . metFORMIN (GLUCOPHAGE-XR) 24 hr tablet 1,500 mg  1,500 mg Oral Q breakfast [name], MD   1,500 mg at 05/24/16 0914  . metoCLOPramide (REGLAN) injection 5 mg  5 mg Intravenous Q8H Jerald Kief, MD   5 mg at 05/24/16 0552  . multivitamin with minerals tablet 1 tablet  1 tablet Oral Daily [name], MD   1 tablet at 05/24/16 0914  . ondansetron (ZOFRAN) tablet 4 mg  4 mg Oral Q6H PRN [name], MD       Or  . ondansetron [facility]) injection 4 mg  4 mg Intravenous Q6H PRN [name], MD      . oxyCODONE (Oxy IR/ROXICODONE) immediate release tablet 5 mg  5 mg Oral Q6H PRN [name], MD   5 mg at 05/23/16 2223  . potassium chloride (K-DUR,KLOR-CON) CR tablet 10 mEq  10 mEq Oral Daily [name], MD   10 mEq at 05/24/16 0915  . rivaroxaban (XARELTO) tablet 20 mg  20 mg Oral Daily [name], MD   20 mg at 05/24/16 0915  . simvastatin (ZOCOR) tablet 20 mg  20 mg Oral Daily [name], MD   20 mg at 05/24/16 0915  . sodium chloride flush (NS) 0.9 % injection 3 mL  3 mL Intravenous Q12H [name], MD   3 mL at 05/24/16 0918  . Vilazodone HCl (VIIBRYD) TABS 40 mg  40 mg Oral Daily [name], MD   40 mg at 05/23/16 1037     Discharge Medications: Medication List    TAKE these medications   azelastine 0.1 % nasal spray Commonly known as:  ASTELIN Place 1 spray into both nostrils 2 (two) times daily. Use in each nostril as directed  carvedilol 6.25 MG tablet Commonly known as:  COREG TAKE 1 TABLET BY MOUTH TWICE DAILY.  DAILY VITE Tabs TAKE 1 TABLET BY MOUTH ONCE DAILY.  fluticasone 50 MCG/ACT nasal spray Commonly known as:  FLONASE Place 2 sprays into both nostrils daily.  Insulin Glargine 100 UNIT/ML Solostar Pen Commonly known as:  LANTUS SOLOSTAR Inject 30 Units into the skin daily at 10 pm.  metFORMIN 750 MG 24 hr tablet Commonly known as:   GLUCOPHAGE-XR Take 2 tablets (1,500 mg total) by mouth daily with breakfast.  metoCLOPramide 5 MG tablet Commonly known as:  REGLAN Take 1 tablet (5 mg total) by mouth every 6 (six) hours as needed for nausea.  nitroGLYCERIN 0.4 MG SL tablet Commonly known as:  NITROSTAT Place 1 tablet (0.4 mg total) under the tongue every 5 (five) minutes x 3 doses as needed for chest pain.  oxyCODONE 5 MG immediate release tablet Commonly known as:  Oxy IR/ROXICODONE Take 1 tablet (5 mg total) by mouth every 6 (  six) hours as needed for severe pain.  potassium chloride 10 MEQ tablet Commonly known as:  K-DUR,KLOR-CON Take 1 tablet (10 mEq total) by mouth daily.  simvastatin 20 MG tablet Commonly known as:  ZOCOR TAKE 1 TABLET BY MOUTH ONCE DAILY.  torsemide 20 MG tablet Commonly known as:  DEMADEX Take 2 tablets (40 mg total) by mouth 2 (two) times daily. What changed:  how much to take  valsartan 40 MG tablet Commonly known as:  DIOVAN Take 1 tablet (40 mg total) by mouth 2 (two) times daily.  Vilazodone HCl 40 MG Tabs Commonly known as:  VIIBRYD Take 1 tablet (40 mg total) by mouth daily.  XARELTO 20 MG Tabs tablet Generic drug:  rivaroxaban TAKE 1 TABLET BY MOUTH ONCE DAILY.       Relevant Imaging Results:  Relevant Lab Results:   Additional Information    Karn [place], [place] [phone]

## 2016-05-24 NOTE — Progress Notes (Signed)
Central telemetry called to report 6 beat run of SVT & freq PVCs. Patient is asymptomatic & denies chest pain. MD notified.

## 2016-05-24 NOTE — Discharge Summary (Signed)
Physician Discharge Summary  BLY BAE JJO:[id number] DOB: Mar 09, 1951 DOA: 05/20/2016  PCP: Sanda Linger, MD  Admit date: 05/20/2016 Discharge date: 05/24/2016  Admitted From: Home  Disposition: Home   Recommendations for Outpatient Follow-up:  1. Follow up with PCP in 1-2 weeks 2. Please obtain BMP/CBC in one week 3. Follow up with Cardiology on 11/20  Home Health: No  Equipment/Devices: None   Discharge Condition: Improved  CODE STATUS: Full  Diet recommendation: Carb modified/heart healthy   Brief/Interim Summary: 33 yom with a history of afib on xarelto, CHF, CAD, PSVT, LBBB, type 2 DM, gastroparesis, DJD, Mitral valve, obesity, nephrolithiasis, and HTN, presented with chest pain, abdominal distension, nausea, and vomiting. He was recently admitted for CHF in which he was diuresed and discharged on double Demadex doses. While in the ED, serology was unremarkable except for elevated BS.b Abdominal work-up was negative. CXR showed vascular congestion. He was started on lasix and admitted for further evaluation of acute on chronic systolic CHF.  Discharge Diagnoses:  Principal Problem:   Acute on chronic systolic CHF (congestive heart failure) (HCC) Active Problems:   Morbid obesity (HCC)   LBBB (left bundle branch block)   OSA (obstructive sleep apnea)   Type II diabetes mellitus with manifestations (HCC)   Hyperlipidemia with target LDL less than 70   COPD (chronic obstructive pulmonary disease) with chronic bronchitis (HCC)   Chronic atrial fibrillation (HCC)   S/P mitral valve replacement with bioprosthetic valve  1. Abdominal pain. This was likely secondary togastroparesis in setting of diabetes. Patient continued to improve with Reglan and no longer complains of abdominal pain.   2. Acute on on chronic systolic CHF. Patient had an echocardiogram last month with an EF of 40-45%. It was unclear of patients dry weight. He was diuresed with intravenous lasix and has  had good urine output. His volume status is -5.8L since admission. His shortness of breath has resolved and he is able to ambulate comfortably. Will increase demadex to 40mg  bid. Follow up with cardiology next week for further adjustments of medications. 3. A-fib. Patient is anticoagulated on xarelto, will continue as an outpatient. Rate remained controlled during hospitalization.   4. S/p MVR, biprosthetic. Remained stable. Continue home medications as an outpatient.  5. COPD. Remained stable. Continue required supplemental oxygen as an outpatient. 6. HTN. Pressures remained stable. Continue on valsartan/coreg 7. Dm type 2. Glucose remained stable. Restart metformin on discharge. Continue on lantus  Discharge Instructions  Discharge Instructions    Diet - low sodium heart healthy    Complete by:  As directed    Increase activity slowly    Complete by:  As directed        Medication List    TAKE these medications   azelastine 0.1 % nasal spray Commonly known as:  ASTELIN Place 1 spray into both nostrils 2 (two) times daily. Use in each nostril as directed   carvedilol 6.25 MG tablet Commonly known as:  COREG TAKE 1 TABLET BY MOUTH TWICE DAILY.   DAILY VITE Tabs TAKE 1 TABLET BY MOUTH ONCE DAILY.   fluticasone 50 MCG/ACT nasal spray Commonly known as:  FLONASE Place 2 sprays into both nostrils daily.   Insulin Glargine 100 UNIT/ML Solostar Pen Commonly known as:  LANTUS SOLOSTAR Inject 30 Units into the skin daily at 10 pm.   metFORMIN 750 MG 24 hr tablet Commonly known as:  GLUCOPHAGE-XR Take 2 tablets (1,500 mg total) by mouth daily with breakfast.   metoCLOPramide  5 MG tablet Commonly known as:  REGLAN Take 1 tablet (5 mg total) by mouth every 6 (six) hours as needed for nausea.   nitroGLYCERIN 0.4 MG SL tablet Commonly known as:  NITROSTAT Place 1 tablet (0.4 mg total) under the tongue every 5 (five) minutes x 3 doses as needed for chest pain.   oxyCODONE 5 MG  immediate release tablet Commonly known as:  Oxy IR/ROXICODONE Take 1 tablet (5 mg total) by mouth every 6 (six) hours as needed for severe pain.   potassium chloride 10 MEQ tablet Commonly known as:  K-DUR,KLOR-CON Take 1 tablet (10 mEq total) by mouth daily.   simvastatin 20 MG tablet Commonly known as:  ZOCOR TAKE 1 TABLET BY MOUTH ONCE DAILY.   torsemide 20 MG tablet Commonly known as:  DEMADEX Take 2 tablets (40 mg total) by mouth 2 (two) times daily. What changed:  how much to take   valsartan 40 MG tablet Commonly known as:  DIOVAN Take 1 tablet (40 mg total) by mouth 2 (two) times daily.   Vilazodone HCl 40 MG Tabs Commonly known as:  VIIBRYD Take 1 tablet (40 mg total) by mouth daily.   XARELTO 20 MG Tabs tablet Generic drug:  rivaroxaban TAKE 1 TABLET BY MOUTH ONCE DAILY.       Allergies  Allergen Reactions  . Daptomycin Rash  . Lisinopril Cough  . Tape Rash and Other (See Comments)    Adhesive Tape-Burn skin.    Consultations:  None    Procedures/Studies: Dg Chest 2 View  Result Date: 05/20/2016 CLINICAL DATA:  Nausea and vomiting, onset last night after eating. Epigastric abdominal pain. EXAM: CHEST  2 VIEW COMPARISON:  05/05/2016 FINDINGS: There is unchanged moderate cardiomegaly. Mild hyperinflation. There is extensive interstitial thickening which probably represents interstitial edema. No airspace consolidation. No effusion. Hilar and mediastinal contours are unremarkable and unchanged. There is prior median sternotomy. IMPRESSION: Cardiomegaly and extensive interstitial thickening. This may represent interstitial edema. No consolidation. No effusion. Electronically Signed   By: Ellery Plunkaniel R Mitchell M.D.   On: 05/20/2016 03:17   Dg Abdomen 1 View  Result Date: 05/04/2016 CLINICAL DATA:  Abdominal pain and distention. EXAM: ABDOMEN - 1 VIEW COMPARISON:  None. FINDINGS: The bowel gas pattern is normal. No radio-opaque calculi or other significant  radiographic abnormality are seen. IMPRESSION: Negative. Electronically Signed   By: Gerome Samavid  Williams III M.D   On: 05/04/2016 22:22   Ct Abdomen Pelvis W Contrast  Result Date: 05/20/2016 CLINICAL DATA:  Nausea and vomiting, onset last night after eating. Epigastric abdominal pain. EXAM: CT ABDOMEN AND PELVIS WITH CONTRAST TECHNIQUE: Multidetector CT imaging of the abdomen and pelvis was performed using the standard protocol following bolus administration of intravenous contrast. CONTRAST:  100mL ISOVUE-300 IOPAMIDOL (ISOVUE-300) INJECTION 61%, 1 ISOVUE-300 IOPAMIDOL (ISOVUE-300) INJECTION 61% COMPARISON:  05/25/2011 FINDINGS: Lower chest: Interlobular septal thickening may represent interstitial edema. No pleural or pericardial effusions. Hepatobiliary: No focal liver abnormality is seen. No gallstones, gallbladder wall thickening, or biliary dilatation. Pancreas: Unremarkable. No pancreatic ductal dilatation or surrounding inflammatory changes. Spleen: Normal in size without focal abnormality. Adrenals/Urinary Tract: Adrenals are unremarkable. Lower pole left collecting system calculi measure up to 3 mm. Hypodense renal lesions measure up to 2 point 3 cm, likely cysts although not conclusively characterized. Ureters are unremarkable. Urinary bladder is unremarkable. Stomach/Bowel: Stomach is within normal limits. Appendix appears normal. No evidence of bowel wall thickening, distention, or inflammatory changes. Vascular/Lymphatic: Aortic atherosclerosis. No enlarged abdominal or pelvic  lymph nodes. Reproductive: Unremarkable Other: No acute inflammatory changes are evident in the abdomen or pelvis. There is no ascites. Musculoskeletal: No significant skeletal lesions. No acute bony abnormalities. Moderately severe right hip arthritis. Mild to moderate left hip arthritis. Extensive lumbar degenerative disc and facet disease. IMPRESSION: 1. No acute findings are evident in the abdomen or pelvis. 2. Lung bases  exhibit interlobular septal thickening, suggesting interstitial edema. 3. Lower pole left nephrolithiasis, nonobstructing. Electronically Signed   By: Ellery Plunk M.D.   On: 05/20/2016 03:25   Dg Chest Portable 1 View  Result Date: 05/05/2016 CLINICAL DATA:  Shortness of breath. EXAM: PORTABLE CHEST 1 VIEW COMPARISON:  January 19, 2015 FINDINGS: Cardiomegaly and moderate edema. No other acute abnormalities or changes. Healed right rib fracture. IMPRESSION: Cardiomegaly and moderate edema. No other acute abnormalities or changes. Electronically Signed   By: Gerome Sam III M.D   On: 05/05/2016 01:15     Subjective: No shortness of breath or chest pain  Discharge Exam: Vitals:   05/23/16 2142 05/24/16 0657  BP: 105/62 (!) 110/59  Pulse: 76 76  Resp: 20 14  Temp: 98.6 F (37 C) 98 F (36.7 C)   Vitals:   05/23/16 0557 05/23/16 1510 05/23/16 2142 05/24/16 0657  BP: 115/61 (!) 93/48 105/62 (!) 110/59  Pulse: 79 (!) 41 76 76  Resp: 15 15 20 14   Temp:  98.1 F (36.7 C) 98.6 F (37 C) 98 F (36.7 C)  TempSrc: Oral Oral Oral Oral  SpO2: 94% 96% 96% 94%  Weight: 130.5 kg (287 lb 9.6 oz)   129.5 kg (285 lb 6.3 oz)  Height:        General: Pt is alert, awake, not in acute distress Cardiovascular: RRR, S1/S2 +, no rubs, no gallops Respiratory: CTA bilaterally, no wheezing, no rhonchi Abdominal: Soft, NT, ND, bowel sounds + Extremities: 1+ edema, no cyanosis    The results of significant diagnostics from this hospitalization (including imaging, microbiology, ancillary and laboratory) are listed below for reference.     Microbiology: No results found for this or any previous visit (from the past 240 hour(s)).   Labs: BNP (last 3 results)  Recent Labs  05/04/16 2121 05/20/16 0142  BNP 96.0 144.0*   Basic Metabolic Panel:  Recent Labs Lab 05/20/16 0142 05/21/16 0634 05/22/16 0720 05/23/16 0654 05/24/16 0659  NA 139 140 137 135 136  K 3.9 3.8 3.7 3.4* 3.9   CL 102 99* 95* 94* 95*  CO2 27 35* 35* 33* 32  GLUCOSE 327* 171* 173* 188* 187*  BUN 16 14 15 12 14   CREATININE 1.18 1.03 0.88 0.87 0.96  CALCIUM 9.3 8.8* 9.0 9.0 9.1   Liver Function Tests:  Recent Labs Lab 05/20/16 0142  AST 25  ALT 30  ALKPHOS 71  BILITOT 0.9  PROT 7.2  ALBUMIN 4.1    Recent Labs Lab 05/20/16 0142  LIPASE 30   No results for input(s): AMMONIA in the last 168 hours. CBC:  Recent Labs Lab 05/20/16 0142  WBC 8.5  NEUTROABS 7.8*  HGB 14.5  HCT 43.7  MCV 94.8  PLT 128*   Cardiac Enzymes:  Recent Labs Lab 05/20/16 0142  TROPONINI <0.03   BNP: Invalid input(s): POCBNP CBG:  Recent Labs Lab 05/23/16 1133 05/23/16 1830 05/23/16 2138 05/24/16 0806 05/24/16 1130  GLUCAP 149* 276* 213* 182* 236*   D-Dimer No results for input(s): DDIMER in the last 72 hours. Hgb A1c No results for input(s): HGBA1C in  the last 72 hours. Lipid Profile No results for input(s): CHOL, HDL, LDLCALC, TRIG, CHOLHDL, LDLDIRECT in the last 72 hours. Thyroid function studies No results for input(s): TSH, T4TOTAL, T3FREE, THYROIDAB in the last 72 hours.  Invalid input(s): FREET3 Anemia work up No results for input(s): VITAMINB12, FOLATE, FERRITIN, TIBC, IRON, RETICCTPCT in the last 72 hours. Urinalysis    Component Value Date/Time   COLORURINE YELLOW 05/12/2016 1108   APPEARANCEUR CLEAR 05/12/2016 1108   LABSPEC 1.015 05/12/2016 1108   PHURINE 6.0 05/12/2016 1108   GLUCOSEU 250 (A) 05/12/2016 1108   GLUCOSEU NEGATIVE 10/12/2015 0930   HGBUR NEGATIVE 05/12/2016 1108   BILIRUBINUR NEGATIVE 05/12/2016 1108   KETONESUR 40 (A) 05/12/2016 1108   PROTEINUR TRACE (A) 05/12/2016 1108   UROBILINOGEN 0.2 10/12/2015 0930   NITRITE NEGATIVE 05/12/2016 1108   LEUKOCYTESUR NEGATIVE 05/12/2016 1108   Sepsis Labs Invalid input(s): PROCALCITONIN,  WBC,  LACTICIDVEN Microbiology No results found for this or any previous visit (from the past 240 hour(s)).   Time  coordinating discharge: Over 30 minutes  SIGNED:   Erick Blinks, MD  [facility] 05/24/2016, 11:34 AM If 7PM-7AM, please contact night-coverage [email or web address] Password [email or web address]

## 2016-05-27 ENCOUNTER — Ambulatory Visit: Payer: Self-pay | Admitting: Cardiovascular Disease

## 2016-05-27 ENCOUNTER — Encounter: Payer: Self-pay | Admitting: Cardiovascular Disease

## 2016-05-28 ENCOUNTER — Ambulatory Visit (INDEPENDENT_AMBULATORY_CARE_PROVIDER_SITE_OTHER): Payer: Medicare Other | Admitting: Orthopedic Surgery

## 2016-05-28 DIAGNOSIS — M25561 Pain in right knee: Secondary | ICD-10-CM

## 2016-05-28 DIAGNOSIS — G8929 Other chronic pain: Secondary | ICD-10-CM | POA: Diagnosis not present

## 2016-05-28 NOTE — Progress Notes (Signed)
Chief Complaint  Patient presents with  . Knee Pain    right knee pain (referred by Dr Hilda Lias)   Ht 5\' 8"  (1.727 m)   Wt 286 lb (129.7 kg)   BMI 43.5 kg/m   65 year old male with osteoarthritis of his right knee also has congestive heart failure morbid obesity he's had a nursing home which makes him high risk for MRSA infection with joint replacement he has a peak heart valve replacement and has peripheral edema and peripheral vascular disease  Checklist for surgery  #1 on December 13 he has a appointment with vascular and vein  #2 Dr. Purvis Sheffield cardiologist which will see him prior to surgery  #3 is on a diet for his BMI of 46  He will call us when his vascular vein appointment is done and then he comes in we will weigh him each time  Encounter Diagnoses  Name Primary?  . Morbid obesity due to excess calories (HCC) Yes  . Chronic pain of right knee

## 2016-06-05 ENCOUNTER — Ambulatory Visit (INDEPENDENT_AMBULATORY_CARE_PROVIDER_SITE_OTHER): Payer: Medicare Other | Admitting: Internal Medicine

## 2016-06-05 ENCOUNTER — Encounter: Payer: Self-pay | Admitting: Internal Medicine

## 2016-06-05 VITALS — BP 130/70 | HR 75 | Temp 97.9°F | Resp 16 | Ht 68.0 in | Wt 294.5 lb

## 2016-06-05 DIAGNOSIS — Z794 Long term (current) use of insulin: Secondary | ICD-10-CM

## 2016-06-05 DIAGNOSIS — E118 Type 2 diabetes mellitus with unspecified complications: Secondary | ICD-10-CM

## 2016-06-05 DIAGNOSIS — I5022 Chronic systolic (congestive) heart failure: Secondary | ICD-10-CM

## 2016-06-05 DIAGNOSIS — M21962 Unspecified acquired deformity of left lower leg: Secondary | ICD-10-CM | POA: Diagnosis not present

## 2016-06-05 DIAGNOSIS — M21961 Unspecified acquired deformity of right lower leg: Secondary | ICD-10-CM

## 2016-06-05 NOTE — Patient Instructions (Signed)

## 2016-06-05 NOTE — Progress Notes (Signed)
Pre visit review using our clinic review tool, if applicable. No additional management support is needed unless otherwise documented below in the visit note. 

## 2016-06-05 NOTE — Progress Notes (Signed)
Subjective:  Patient ID: Cody Hale, male    DOB: 1951/03/29  Age: 65 y.o. MRN: [id number]  CC: Hypertension; Diabetes; and Congestive Heart Failure   HPI Cody Hale presents for a Hospital follow-up for recent admission for CHF. He has gained weight since he was discharged but he doesn't think it's fluid because he denies worsening edema or shortness of breath. He is struggling with his dietary compliance with respect to sodium and carbohydrates. He has had no recent episodes of chest pain, shortness of breath, palpitations, syncope, or worsening edema.  Outpatient Medications Prior to Visit  Medication Sig Dispense Refill  . azelastine (ASTELIN) 0.1 % nasal spray Place 1 spray into both nostrils 2 (two) times daily. Use in each nostril as directed 30 mL 12  . carvedilol (COREG) 6.25 MG tablet TAKE 1 TABLET BY MOUTH TWICE DAILY. 60 tablet 11  . fluticasone (FLONASE) 50 MCG/ACT nasal spray Place 2 sprays into both nostrils daily. 16 g 11  . Insulin Glargine (LANTUS SOLOSTAR) 100 UNIT/ML Solostar Pen Inject 30 Units into the skin daily at 10 pm. 15 mL 11  . metFORMIN (GLUCOPHAGE-XR) 750 MG 24 hr tablet Take 2 tablets (1,500 mg total) by mouth daily with breakfast. 180 tablet 1  . metoCLOPramide (REGLAN) 5 MG tablet Take 1 tablet (5 mg total) by mouth every 6 (six) hours as needed for nausea. 30 tablet 0  . Multiple Vitamin (DAILY VITE) TABS TAKE 1 TABLET BY MOUTH ONCE DAILY. 30 tablet 11  . oxyCODONE (OXY IR/ROXICODONE) 5 MG immediate release tablet Take 1 tablet (5 mg total) by mouth every 6 (six) hours as needed for severe pain. 75 tablet 0  . potassium chloride SA (K-DUR,KLOR-CON) 10 MEQ tablet Take 1 tablet (10 mEq total) by mouth daily. 90 tablet 3  . simvastatin (ZOCOR) 20 MG tablet TAKE 1 TABLET BY MOUTH ONCE DAILY. 30 tablet 11  . torsemide (DEMADEX) 20 MG tablet Take 2 tablets (40 mg total) by mouth 2 (two) times daily. 90 tablet 0  . valsartan (DIOVAN) 40 MG tablet Take 1  tablet (40 mg total) by mouth 2 (two) times daily. 180 tablet 3  . Vilazodone HCl (VIIBRYD) 40 MG TABS Take 1 tablet (40 mg total) by mouth daily. 30 tablet 11  . XARELTO 20 MG TABS tablet TAKE 1 TABLET BY MOUTH ONCE DAILY. 30 tablet 5  . nitroGLYCERIN (NITROSTAT) 0.4 MG SL tablet Place 1 tablet (0.4 mg total) under the tongue every 5 (five) minutes x 3 doses as needed for chest pain. (Patient not taking: Reported on 06/05/2016) 25 tablet 3   No facility-administered medications prior to visit.     ROS Review of Systems  Constitutional: Positive for unexpected weight change. Negative for appetite change, diaphoresis and fatigue.  HENT: Negative.   Eyes: Negative.  Negative for visual disturbance.  Respiratory: Negative for cough, chest tightness, shortness of breath, wheezing and stridor.   Cardiovascular: Negative for chest pain, palpitations and leg swelling.  Gastrointestinal: Negative.  Negative for abdominal pain, constipation, diarrhea, nausea and vomiting.  Endocrine: Negative for cold intolerance, heat intolerance, polydipsia, polyphagia and polyuria.  Genitourinary: Negative.  Negative for difficulty urinating.  Musculoskeletal: Negative.  Negative for arthralgias, back pain and myalgias.  Skin: Negative.   Neurological: Negative.  Negative for dizziness, weakness and light-headedness.  Hematological: Negative.  Negative for adenopathy. Does not bruise/bleed easily.  Psychiatric/Behavioral: Negative.     Objective:  BP 130/70 (BP Location: Left Arm, Patient Position: Sitting,  Cuff Size: Large)   Pulse 75   Temp 97.9 F (36.6 C) (Oral)   Resp 16   Ht 5\' 8"  (1.727 m)   Wt 294 lb 8 oz (133.6 kg)   SpO2 95%   BMI 44.78 kg/m   BP Readings from Last 3 Encounters:  06/05/16 130/70  05/24/16 (!) 110/59  05/12/16 112/64    Wt Readings from Last 3 Encounters:  06/05/16 294 lb 8 oz (133.6 kg)  05/28/16 286 lb (129.7 kg)  05/24/16 285 lb 6.3 oz (129.5 kg)    Physical  Exam  Constitutional: He is oriented to person, place, and time. No distress.  HENT:  Mouth/Throat: Oropharynx is clear and moist. No oropharyngeal exudate.  Eyes: Conjunctivae are normal. Right eye exhibits no discharge. Left eye exhibits no discharge. No scleral icterus.  Neck: Normal range of motion. Neck supple. No JVD present. No tracheal deviation present. No thyromegaly present.  Cardiovascular: Normal rate, regular rhythm, normal heart sounds and intact distal pulses.  Exam reveals no gallop and no friction rub.   No murmur heard. Pulmonary/Chest: Effort normal and breath sounds normal. No stridor. No respiratory distress. He has no wheezes. He has no rales. He exhibits no tenderness.  Abdominal: Soft. Bowel sounds are normal. He exhibits no distension and no mass. There is no tenderness. There is no rebound and no guarding.  Musculoskeletal: Normal range of motion. He exhibits edema (trace pitting edema in BLE). He exhibits no tenderness or deformity.  Lymphadenopathy:    He has no cervical adenopathy.  Neurological: He is oriented to person, place, and time.  Skin: Skin is warm and dry. No rash noted. He is not diaphoretic. No erythema. No pallor.  Vitals reviewed.   Lab Results  Component Value Date   WBC 8.5 05/20/2016   HGB 14.5 05/20/2016   HCT 43.7 05/20/2016   PLT 128 (L) 05/20/2016   GLUCOSE 187 (H) 05/24/2016   CHOL 102 03/13/2016   TRIG 108.0 03/13/2016   HDL 46.80 03/13/2016   LDLDIRECT 84.2 01/31/2014   LDLCALC 34 03/13/2016   ALT 30 05/20/2016   AST 25 05/20/2016   NA 136 05/24/2016   K 3.9 05/24/2016   CL 95 (L) 05/24/2016   CREATININE 0.96 05/24/2016   BUN 14 05/24/2016   CO2 32 05/24/2016   TSH 0.943 05/20/2016   PSA 0.36 05/30/2014   INR 1.13 11/30/2012   HGBA1C 7.5 (H) 03/13/2016   MICROALBUR 0.7 11/22/2014    Dg Chest 2 View  Result Date: 05/20/2016 CLINICAL DATA:  Nausea and vomiting, onset last night after eating. Epigastric abdominal  pain. EXAM: CHEST  2 VIEW COMPARISON:  05/05/2016 FINDINGS: There is unchanged moderate cardiomegaly. Mild hyperinflation. There is extensive interstitial thickening which probably represents interstitial edema. No airspace consolidation. No effusion. Hilar and mediastinal contours are unremarkable and unchanged. There is prior median sternotomy. IMPRESSION: Cardiomegaly and extensive interstitial thickening. This may represent interstitial edema. No consolidation. No effusion. Electronically Signed   By: Ellery Plunk M.D.   On: 05/20/2016 03:17   Ct Abdomen Pelvis W Contrast  Result Date: 05/20/2016 CLINICAL DATA:  Nausea and vomiting, onset last night after eating. Epigastric abdominal pain. EXAM: CT ABDOMEN AND PELVIS WITH CONTRAST TECHNIQUE: Multidetector CT imaging of the abdomen and pelvis was performed using the standard protocol following bolus administration of intravenous contrast. CONTRAST:  ISOVUE-300 IOPAMIDOL (ISOVUE-300) INJECTION 61%, 1 ISOVUE-300 IOPAMIDOL (ISOVUE-300) INJECTION 61% COMPARISON:  05/25/2011 FINDINGS: Lower chest: Interlobular septal thickening may represent  interstitial edema. No pleural or pericardial effusions. Hepatobiliary: No focal liver abnormality is seen. No gallstones, gallbladder wall thickening, or biliary dilatation. Pancreas: Unremarkable. No pancreatic ductal dilatation or surrounding inflammatory changes. Spleen: Normal in size without focal abnormality. Adrenals/Urinary Tract: Adrenals are unremarkable. Lower pole left collecting system calculi measure up to 3 mm. Hypodense renal lesions measure up to 2 point 3 cm, likely cysts although not conclusively characterized. Ureters are unremarkable. Urinary bladder is unremarkable. Stomach/Bowel: Stomach is within normal limits. Appendix appears normal. No evidence of bowel wall thickening, distention, or inflammatory changes. Vascular/Lymphatic: Aortic atherosclerosis. No enlarged abdominal or pelvic lymph  nodes. Reproductive: Unremarkable Other: No acute inflammatory changes are evident in the abdomen or pelvis. There is no ascites. Musculoskeletal: No significant skeletal lesions. No acute bony abnormalities. Moderately severe right hip arthritis. Mild to moderate left hip arthritis. Extensive lumbar degenerative disc and facet disease. IMPRESSION: 1. No acute findings are evident in the abdomen or pelvis. 2. Lung bases exhibit interlobular septal thickening, suggesting interstitial edema. 3. Lower pole left nephrolithiasis, nonobstructing. Electronically Signed   By: Ellery Plunk M.D.   On: 05/20/2016 03:25    Assessment & Plan:   Cody Hale was seen today for hypertension, diabetes and congestive heart failure.  Diagnoses and all orders for this visit:  Foot deformity, bilateral -     For home use only DME Other see comment  Type 2 diabetes mellitus with complication, with long-term current use of insulin (HCC)- his recent A1c was 7.5%, blood sugars have been adequately well controlled -     For home use only DME Other see comment  Chronic systolic heart failure (HCC)- he is concerned that he is gaining weight but I do not see any evidence of fluid overload at this time. I have asked him to be more diligent with his lifestyle modifications including restriction of caloric intake and sodium intake, if he continues to gain weight and/or develop shortness of breath or worsening edema he will notify me or his cardiologist to increase his diuretic regimen.   I am having Cody Hale maintain his nitroGLYCERIN, potassium chloride, Vilazodone HCl, DAILY VITE, XARELTO, carvedilol, simvastatin, valsartan, metFORMIN, Insulin Glargine, fluticasone, azelastine, oxyCODONE, metoCLOPramide, and torsemide.  No orders of the defined types were placed in this encounter.    Follow-up: Return in about 4 months (around 10/03/2016).  Sanda Linger, MD

## 2016-06-12 ENCOUNTER — Encounter: Payer: Self-pay | Admitting: Vascular Surgery

## 2016-06-14 ENCOUNTER — Ambulatory Visit (INDEPENDENT_AMBULATORY_CARE_PROVIDER_SITE_OTHER): Payer: Medicare Other | Admitting: Adult Health

## 2016-06-14 ENCOUNTER — Encounter: Payer: Self-pay | Admitting: Adult Health

## 2016-06-14 VITALS — BP 144/70 | HR 70 | Ht 69.0 in | Wt 289.0 lb

## 2016-06-14 DIAGNOSIS — R600 Localized edema: Secondary | ICD-10-CM

## 2016-06-14 DIAGNOSIS — I5022 Chronic systolic (congestive) heart failure: Secondary | ICD-10-CM

## 2016-06-14 MED ORDER — TORSEMIDE 20 MG PO TABS: 40.0000 mg | ORAL_TABLET | Freq: Two times a day (BID) | ORAL | 6 refills | Status: DC

## 2016-06-14 MED ORDER — POTASSIUM CHLORIDE CRYS ER 10 MEQ PO TBCR: 10.0000 meq | EXTENDED_RELEASE_TABLET | Freq: Every day | ORAL | 3 refills | Status: DC

## 2016-06-14 NOTE — Patient Instructions (Signed)
Your physician recommends that you schedule a follow-up appointment in: one month with Harriet Pho NP    INCREASE your Demadex to 40 mg three times a day for today (Friday, Saturday, and Sunday)   THEN on Monday, resume Demadex 40 mg twice a day    INCREASE your Potassium to 20 meq daily for the next 3 days also, THEN resume Potassium 10 meq daily as before    Get BMET lab work on Monday  06/17/16      Thank you for choosing [facility] !
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
## 2016-06-14 NOTE — Progress Notes (Signed)
Name: Cody Hale    DOB: [birth year]  Age: 65 y.o.  MR#: [id number]       PCP:  Sanda Linger, MD      Insurance: Payor: MEDICARE / Plan: MEDICARE PART A AND B / Product Type: *No Product type* /   CC:    Chief Complaint  Patient presents with  . Congestive Heart Failure  . Atrial Fibrillation  . Hypertension    VS There were no vitals filed for this visit.  Weights Current Weight  06/05/16 294 lb 8 oz (133.6 kg)  05/28/16 286 lb (129.7 kg)  05/24/16 285 lb 6.3 oz (129.5 kg)    Blood Pressure  BP Readings from Last 3 Encounters:  06/05/16 130/70  05/24/16 (!) 110/59  05/12/16 112/64     Admit date:  (Not on file) Last encounter with RMR:  Visit date not found   Allergy Daptomycin; Lisinopril; and Tape  Current Outpatient Prescriptions  Medication Sig Dispense Refill  . azelastine (ASTELIN) 0.1 % nasal spray Place 1 spray into both nostrils 2 (two) times daily. Use in each nostril as directed 30 mL 12  . carvedilol (COREG) 6.25 MG tablet TAKE 1 TABLET BY MOUTH TWICE DAILY. 60 tablet 11  . fluticasone (FLONASE) 50 MCG/ACT nasal spray Place 2 sprays into both nostrils daily. 16 g 11  . Insulin Glargine (LANTUS SOLOSTAR) 100 UNIT/ML Solostar Pen Inject 30 Units into the skin daily at 10 pm. 15 mL 11  . metFORMIN (GLUCOPHAGE-XR) 750 MG 24 hr tablet Take 2 tablets (1,500 mg total) by mouth daily with breakfast. 180 tablet 1  . metoCLOPramide (REGLAN) 5 MG tablet Take 1 tablet (5 mg total) by mouth every 6 (six) hours as needed for nausea. 30 tablet 0  . Multiple Vitamin (DAILY VITE) TABS TAKE 1 TABLET BY MOUTH ONCE DAILY. 30 tablet 11  . nitroGLYCERIN (NITROSTAT) 0.4 MG SL tablet Place 1 tablet (0.4 mg total) under the tongue every 5 (five) minutes x 3 doses as needed for chest pain. 25 tablet 3  . oxyCODONE (OXY IR/ROXICODONE) 5 MG immediate release tablet Take 1 tablet (5 mg total) by mouth every 6 (six) hours as needed for severe pain. 75 tablet 0  . potassium chloride SA  (K-DUR,KLOR-CON) 10 MEQ tablet Take 1 tablet (10 mEq total) by mouth daily. 90 tablet 3  . simvastatin (ZOCOR) 20 MG tablet TAKE 1 TABLET BY MOUTH ONCE DAILY. 30 tablet 11  . torsemide (DEMADEX) 20 MG tablet Take 2 tablets (40 mg total) by mouth 2 (two) times daily. 90 tablet 0  . valsartan (DIOVAN) 40 MG tablet Take 1 tablet (40 mg total) by mouth 2 (two) times daily. 180 tablet 3  . Vilazodone HCl (VIIBRYD) 40 MG TABS Take 1 tablet (40 mg total) by mouth daily. 30 tablet 11  . XARELTO 20 MG TABS tablet TAKE 1 TABLET BY MOUTH ONCE DAILY. 30 tablet 5   No current facility-administered medications for this visit.     Discontinued Meds:   There are no discontinued medications.  Patient Active Problem List   Diagnosis Date Noted  . Acute on chronic systolic CHF (congestive heart failure) (HCC) 05/05/2016  . Allergic rhinitis due to pollen 03/13/2016  . Candida rash of groin 12/12/2015  . Chronic systolic heart failure (HCC) 01/11/2015  . Chronic atrial fibrillation (HCC) 01/11/2015  . S/P mitral valve replacement with bioprosthetic valve 01/11/2015  . Depression with somatization 11/22/2014  . Primary osteoarthritis of both knees 11/22/2014  .  COPD (chronic obstructive pulmonary disease) with chronic bronchitis (HCC) 05/12/2014  . B12 deficiency anemia 02/03/2014  . Routine general medical examination at a health care facility 03/21/2013  . Hyperlipidemia with target LDL less than 70 03/19/2013  . OA (osteoarthritis) 03/19/2013  . NSTEMI (non-ST elevated myocardial infarction) (HCC) 03/09/2013  . Essential hypertension, malignant 12/01/2012  . Type II diabetes mellitus with manifestations (HCC)   . Gastroparesis 11/30/2012  . OSA (obstructive sleep apnea) 11/16/2012  . Endocarditis-resolved 08/26/2012  . LBBB (left bundle branch block) 07/03/2012  . Morbid obesity (HCC) 06/27/2012    LABS    Component Value Date/Time   NA 136 05/24/2016 0659   NA 135 05/23/2016 0654   NA 137  05/22/2016 0720   K 3.9 05/24/2016 0659   K 3.4 (L) 05/23/2016 0654   K 3.7 05/22/2016 0720   CL 95 (L) 05/24/2016 0659   CL 94 (L) 05/23/2016 0654   CL 95 (L) 05/22/2016 0720   CO2 32 05/24/2016 0659   CO2 33 (H) 05/23/2016 0654   CO2 35 (H) 05/22/2016 0720   GLUCOSE 187 (H) 05/24/2016 0659   GLUCOSE 188 (H) 05/23/2016 0654   GLUCOSE 173 (H) 05/22/2016 0720   BUN 14 05/24/2016 0659   BUN 12 05/23/2016 0654   BUN 15 05/22/2016 0720   CREATININE 0.96 05/24/2016 0659   CREATININE 0.87 05/23/2016 0654   CREATININE 0.88 05/22/2016 0720   CREATININE 0.81 06/13/2015 0735   CREATININE 0.86 08/12/2012 1500   CALCIUM 9.1 05/24/2016 0659   CALCIUM 9.0 05/23/2016 0654   CALCIUM 9.0 05/22/2016 0720   GFRNONAA >60 05/24/2016 0659   GFRNONAA >60 05/23/2016 0654   GFRNONAA >60 05/22/2016 0720   GFRAA >60 05/24/2016 0659   GFRAA >60 05/23/2016 0654   GFRAA >60 05/22/2016 0720   CMP     Component Value Date/Time   NA 136 05/24/2016 0659   K 3.9 05/24/2016 0659   CL 95 (L) 05/24/2016 0659   CO2 32 05/24/2016 0659   GLUCOSE 187 (H) 05/24/2016 0659   BUN 14 05/24/2016 0659   CREATININE 0.96 05/24/2016 0659   CREATININE 0.81 06/13/2015 0735   CALCIUM 9.1 05/24/2016 0659   PROT 7.2 05/20/2016 0142   ALBUMIN 4.1 05/20/2016 0142   AST 25 05/20/2016 0142   ALT 30 05/20/2016 0142   ALKPHOS 71 05/20/2016 0142   BILITOT 0.9 05/20/2016 0142   GFRNONAA >60 05/24/2016 0659   GFRAA >60 05/24/2016 0659       Component Value Date/Time   WBC 8.5 05/20/2016 0142   WBC 8.2 05/12/2016 1016   WBC 9.5 05/05/2016 0924   HGB 14.5 05/20/2016 0142   HGB 14.7 05/12/2016 1016   HGB 13.8 05/05/2016 0924   HCT 43.7 05/20/2016 0142   HCT 44.6 05/12/2016 1016   HCT 42.6 05/05/2016 0924   MCV 94.8 05/20/2016 0142   MCV 93.5 05/12/2016 1016   MCV 97.5 05/05/2016 0924    Lipid Panel     Component Value Date/Time   CHOL 102 03/13/2016 1132   TRIG 108.0 03/13/2016 1132   HDL 46.80 03/13/2016 1132    CHOLHDL 2 03/13/2016 1132   VLDL 21.6 03/13/2016 1132   LDLCALC 34 03/13/2016 1132   LDLDIRECT 84.2 01/31/2014 1502    ABG    Component Value Date/Time   PHART 7.306 (L) 05/05/2016 0125   PCO2ART 57.6 (H) 05/05/2016 0125   PO2ART 464.0 (H) 05/05/2016 0125   HCO3 25.0 05/05/2016 0125   TCO2 29.2  03/09/2013 1820   ACIDBASEDEF 1.0 08/28/2012 0922   O2SAT 99.7 05/05/2016 0125     Lab Results  Component Value Date   TSH 0.943 05/20/2016   BNP (last 3 results)  Recent Labs  05/04/16 2121 05/20/16 0142  BNP 96.0 144.0*    ProBNP (last 3 results)  Recent Labs  10/12/15 0930  PROBNP 157.0*    Cardiac Panel (last 3 results) No results for input(s): CKTOTAL, CKMB, TROPONINI, RELINDX in the last 72 hours.  Iron/TIBC/Ferritin/ %Sat    Component Value Date/Time   IRON 57 03/19/2013 1508   TIBC 308 07/07/2012 0600   FERRITIN 34.6 03/19/2013 1508   IRONPCTSAT 14.1 (L) 03/19/2013 1508     EKG Orders placed or performed during the hospital encounter of 05/20/16  . ED EKG  . ED EKG  . EKG     Prior Assessment and Plan Problem List as of 06/14/2016 Reviewed: 06/09/2016 10:28 AM by Sanda Linger, MD     Cardiovascular and Mediastinum   LBBB (left bundle branch block)   Essential hypertension, malignant   Last Assessment & Plan 01/11/2015 Office Visit Written 01/11/2015 12:52 PM by Dyann Kief, PA-C    Patient's blood pressure is controlled      Endocarditis-resolved   NSTEMI (non-ST elevated myocardial infarction) [facility])   Chronic systolic heart failure [facility])   Last Assessment & Plan 08/18/2015 Office Visit Written 08/20/2015  7:11 AM by Carollee Leitz, NP    Ordered daily weights at assisted living facility Asked them to contact either cardiologist are PCP regarding 3 pound weight gain or greater between consecutive days.  Unsure if the weight at last visit was input incorrectly it could've been 274, but patient is unaware of any significant weight gain.       Chronic atrial fibrillation [facility])   Last Assessment & Plan 01/11/2015 Office Visit Written 01/11/2015 12:53 PM by Dyann Kief, PA-C    Increasing Coreg to 6.25 mg twice a day.CHADSVASC is 3. Continue Xarelto      Acute on chronic systolic CHF (congestive heart failure) (HCC)     Respiratory   OSA (obstructive sleep apnea)   Last Assessment & Plan 11/12/2012 Office Visit Written 11/16/2012 11:19 PM by Jeoffrey Massed, MD    Refer to pulmonology for further e/m of possible OSA.      COPD (chronic obstructive pulmonary disease) with chronic bronchitis [facility])   Last Assessment & Plan 05/12/2014 Office Visit Edited 05/15/2014 12:05 PM by [name], MD    Will start incruse for this - I gave him a sample and I showed him how to use it, he demonstrated proficiency with its use      Allergic rhinitis due to pollen     Digestive   Gastroparesis     Endocrine   Type II diabetes mellitus with manifestations [facility])   Last Assessment & Plan 06/21/2014 Office Visit Written 06/21/2014  7:54 PM by [name], MD    His A1C = 10.0 Will cont janumet but will also add basal insulin and invokana a well for better blood sugar control        Musculoskeletal and Integument   OA (osteoarthritis)   Last Assessment & Plan 06/21/2014 Office Visit Written 06/21/2014  7:56 PM by [name], MD    He complains that there is pain and instability in the right hip and thigh area, I asked him to see sports med for further evaluation  Other   Morbid obesity [facility](HCC)   Last Assessment & Plan 03/19/2013 Office Visit Written 03/21/2013 10:26 AM by Etta Grandchildhomas L Jones, MD    He will work on his lifestyle modifications to lose weight      Hyperlipidemia with target LDL less than 70   Last Assessment & Plan 05/30/2014 Office Visit Written 06/01/2014  2:20 PM by Etta Grandchildhomas L Jones, MD    Improvement noted in his trigs Will follow for now      Routine general medical examination at a health care facility    Last Assessment & Plan 03/19/2013 Office Visit Written 03/21/2013 10:29 AM by Etta Grandchildhomas L Jones, MD    Exam done Labs ordered and reviewed Vaccines were updated       B12 deficiency anemia   Last Assessment & Plan 01/31/2014 Office Visit Written 02/03/2014  7:31 AM by Etta Grandchildhomas L Jones, MD    Start oral B12 replacement therapy      Depression with somatization   Primary osteoarthritis of both knees   S/P mitral valve replacement with bioprosthetic valve   Last Assessment & Plan 01/11/2015 Office Visit Written 01/11/2015 12:54 PM by Dyann KiefMichele M Lenze, PA-C    Stable      Candida rash of groin       Imaging: Dg Chest 2 View  Result Date: 05/20/2016 CLINICAL DATA:  Nausea and vomiting, onset last night after eating. Epigastric abdominal pain. EXAM: CHEST  2 VIEW COMPARISON:  05/05/2016 FINDINGS: There is unchanged moderate cardiomegaly. Mild hyperinflation. There is extensive interstitial thickening which probably represents interstitial edema. No airspace consolidation. No effusion. Hilar and mediastinal contours are unremarkable and unchanged. There is prior median sternotomy. IMPRESSION: Cardiomegaly and extensive interstitial thickening. This may represent interstitial edema. No consolidation. No effusion. Electronically Signed   By: Ellery Plunkaniel R Mitchell M.D.   On: 05/20/2016 03:17   Ct Abdomen Pelvis W Contrast  Result Date: 05/20/2016 CLINICAL DATA:  Nausea and vomiting, onset last night after eating. Epigastric abdominal pain. EXAM: CT ABDOMEN AND PELVIS WITH CONTRAST TECHNIQUE: Multidetector CT imaging of the abdomen and pelvis was performed using the standard protocol following bolus administration of intravenous contrast. CONTRAST:  100mL ISOVUE-300 IOPAMIDOL (ISOVUE-300) INJECTION 61%, 1 ISOVUE-300 IOPAMIDOL (ISOVUE-300) INJECTION 61% COMPARISON:  05/25/2011 FINDINGS: Lower chest: Interlobular septal thickening may represent interstitial edema. No pleural or pericardial effusions. Hepatobiliary:  No focal liver abnormality is seen. No gallstones, gallbladder wall thickening, or biliary dilatation. Pancreas: Unremarkable. No pancreatic ductal dilatation or surrounding inflammatory changes. Spleen: Normal in size without focal abnormality. Adrenals/Urinary Tract: Adrenals are unremarkable. Lower pole left collecting system calculi measure up to 3 mm. Hypodense renal lesions measure up to 2 point 3 cm, likely cysts although not conclusively characterized. Ureters are unremarkable. Urinary bladder is unremarkable. Stomach/Bowel: Stomach is within normal limits. Appendix appears normal. No evidence of bowel wall thickening, distention, or inflammatory changes. Vascular/Lymphatic: Aortic atherosclerosis. No enlarged abdominal or pelvic lymph nodes. Reproductive: Unremarkable Other: No acute inflammatory changes are evident in the abdomen or pelvis. There is no ascites. Musculoskeletal: No significant skeletal lesions. No acute bony abnormalities. Moderately severe right hip arthritis. Mild to moderate left hip arthritis. Extensive lumbar degenerative disc and facet disease. IMPRESSION: 1. No acute findings are evident in the abdomen or pelvis. 2. Lung bases exhibit interlobular septal thickening, suggesting interstitial edema. 3. Lower pole left nephrolithiasis, nonobstructing. Electronically Signed   By: Ellery Plunkaniel R Mitchell M.D.   On: 05/20/2016 03:25

## 2016-06-17 ENCOUNTER — Other Ambulatory Visit: Payer: Self-pay | Admitting: Adult Health

## 2016-06-17 DIAGNOSIS — I5022 Chronic systolic (congestive) heart failure: Secondary | ICD-10-CM | POA: Diagnosis not present

## 2016-06-17 LAB — BASIC METABOLIC PANEL
BUN: 18 mg/dL (ref 7–25)
CO2: 35 mmol/L — ABNORMAL HIGH (ref 20–31)
Calcium: 8.7 mg/dL (ref 8.6–10.3)
Chloride: 98 mmol/L (ref 98–110)
Creat: 1.11 mg/dL (ref 0.70–1.25)
Glucose, Bld: 199 mg/dL — ABNORMAL HIGH (ref 65–99)
Potassium: 3.8 mmol/L (ref 3.5–5.3)
Sodium: 141 mmol/L (ref 135–146)

## 2016-06-19 ENCOUNTER — Ambulatory Visit (HOSPITAL_COMMUNITY): Payer: Self-pay

## 2016-06-19 ENCOUNTER — Encounter: Payer: Self-pay | Admitting: Vascular Surgery

## 2016-07-02 IMAGING — DX DG CHEST 2V
2 series · 2 of 2 positions shown · non-contrast
Comparison: 05/31/2015

CLINICAL DATA: Fever and weakness this morning.

EXAM:
CHEST  2 VIEW

[chest lat]
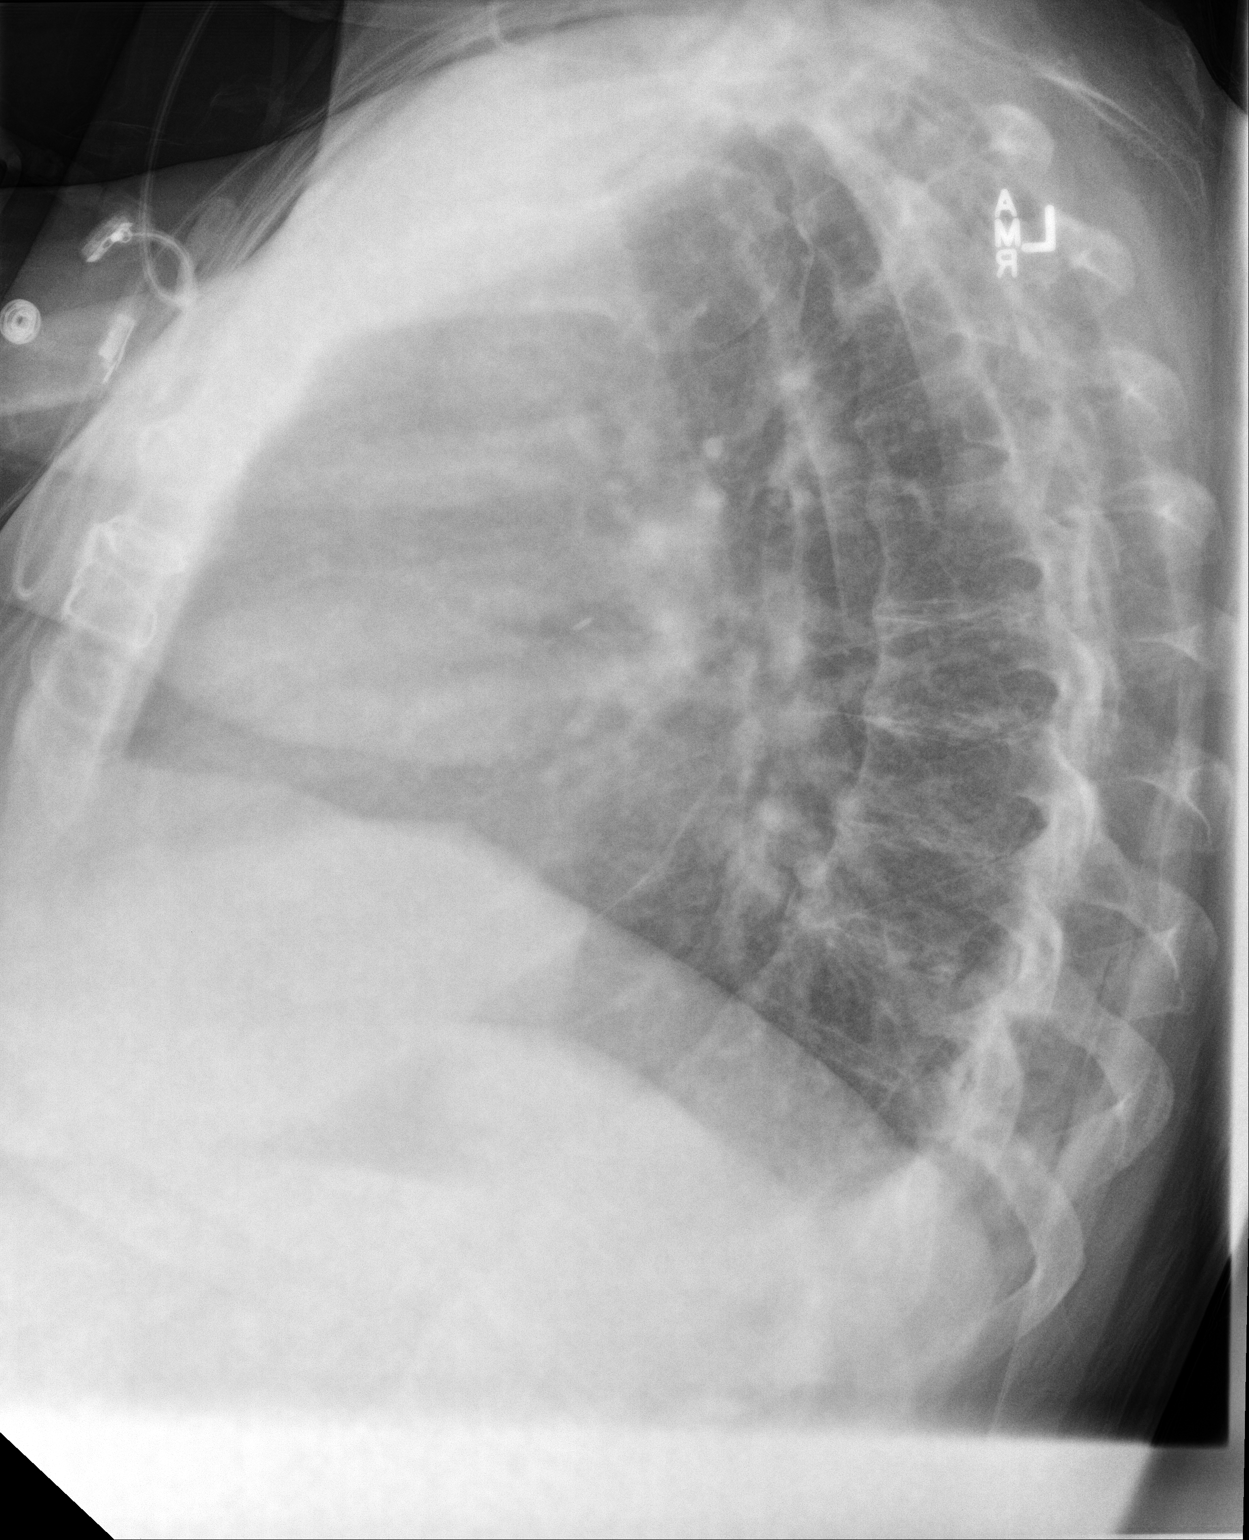

[chest ap]
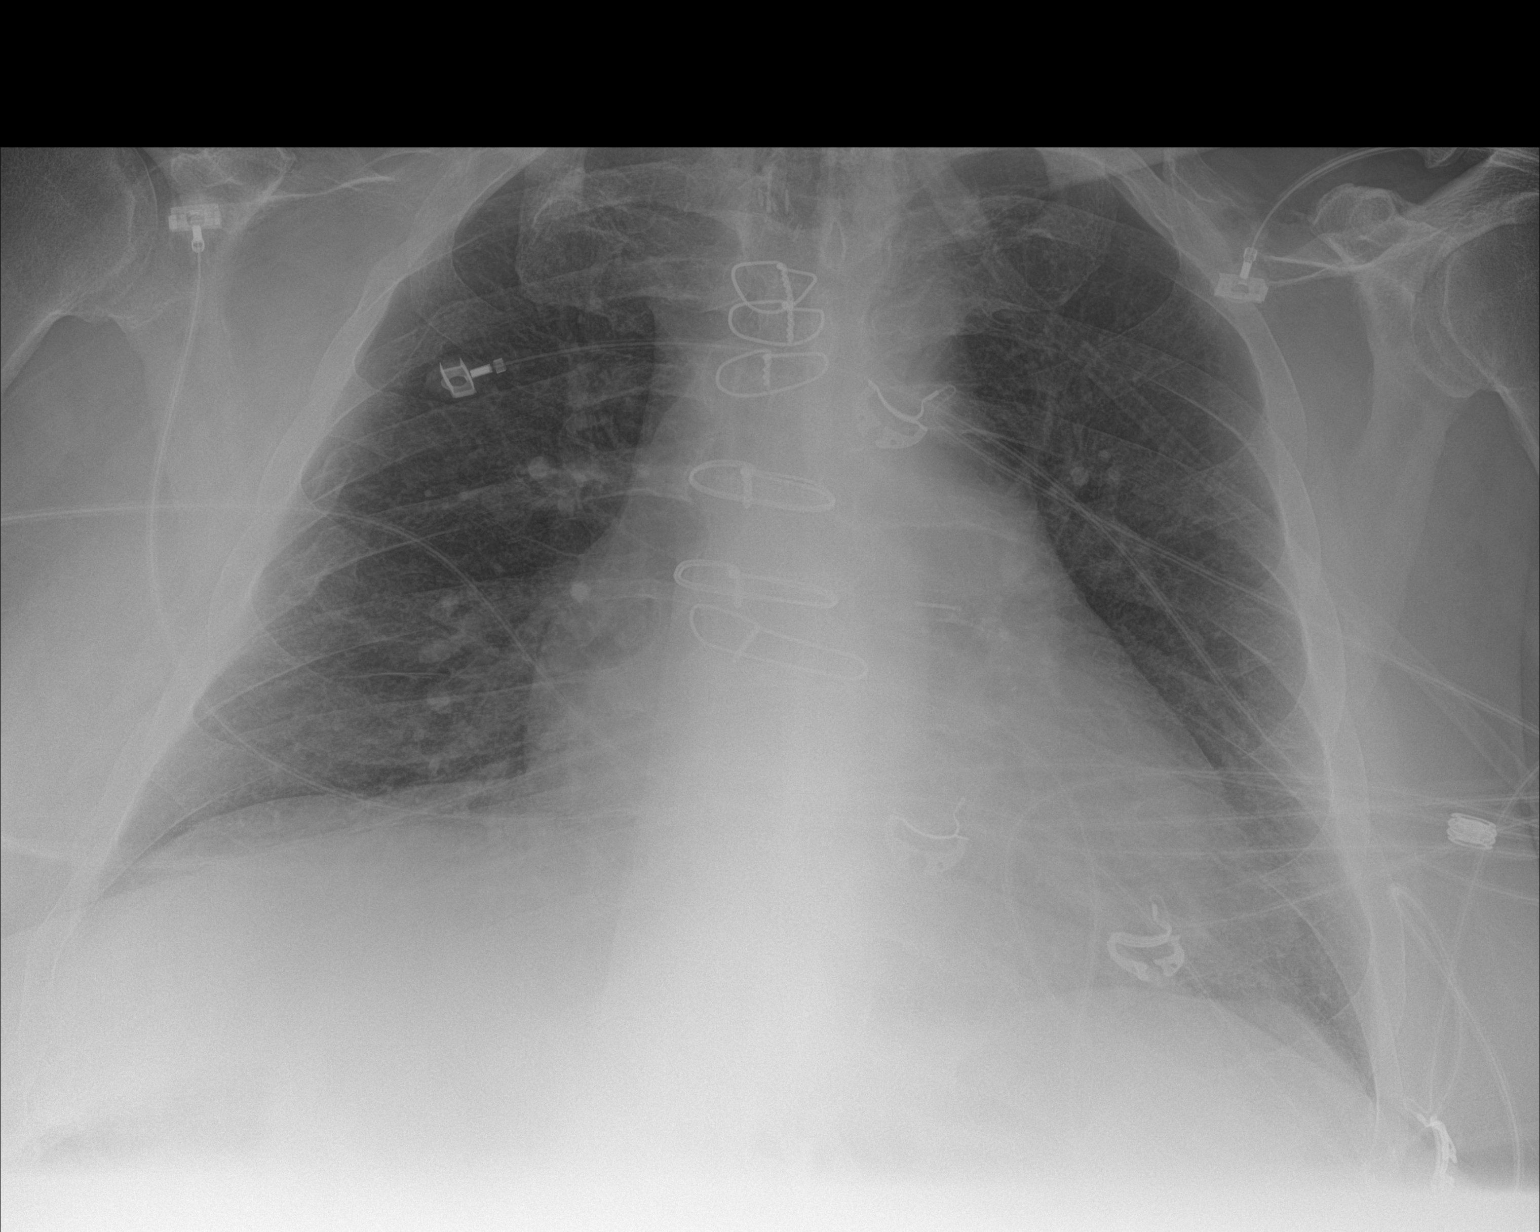

[2 of 2 positions shown; findings below may reference images not displayed]

FINDINGS: The heart is enlarged but stable. Stable surgical changes from
bypass surgery. The mediastinal and hilar contours are within normal
limits and unchanged. The lungs are clear of acute process. No
pleural effusion or pneumothorax.

Numerous remote healed right-sided rib fractures are noted. No acute
bony findings.
IMPRESSION: Stable Cardiac enlargement but no acute pulmonary findings.

## 2016-07-04 IMAGING — MR MR [PERSON_NAME] LOW WO/W CM*R*
3 of 13 series · 7 of 40 positions shown · IV contrast (multihance)
Comparison: None.

CLINICAL DATA: Right leg cellulitis. History of left leg
cellulitis.

EXAM:
MRI OF LOWER RIGHT EXTREMITY WITHOUT AND WITH CONTRAST
TECHNIQUE: Multiplanar, multisequence MR imaging of the left leg was performed
both before and after administration of intravenous contrast.
CONTRAST:  20mL MULTIHANCE GADOBENATE DIMEGLUMINE 529 MG/ML IV SOLN

[Series 5: T2 · coronal · 4.0mm · 0.59mm/px · 1 of 32 slices shown]
[im 1/32]
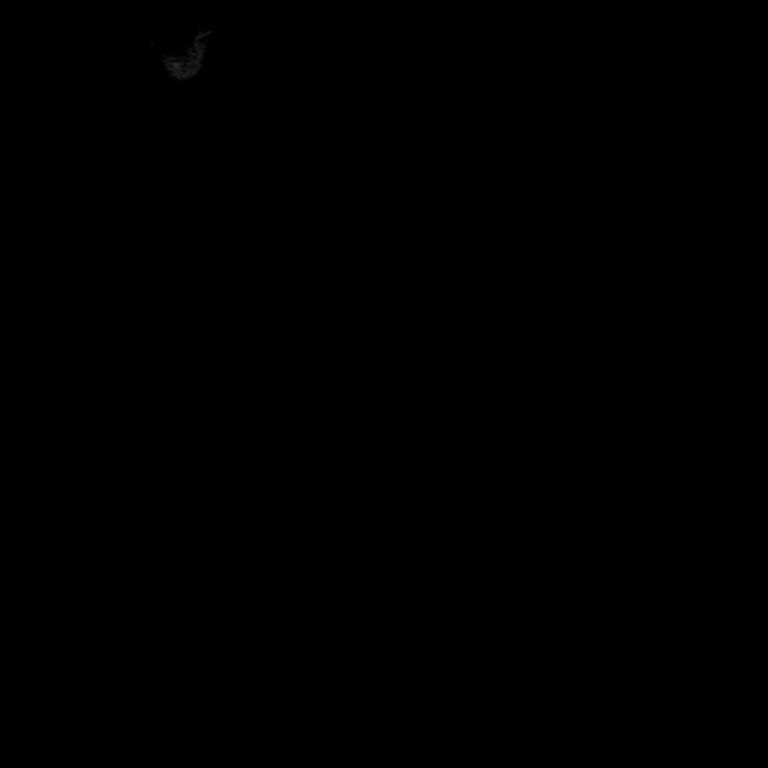

[Series 6: T1 · coronal · 4.0mm · 0.50mm/px · 3 of 32 slices shown (1 of 2)]
[im 1/32]
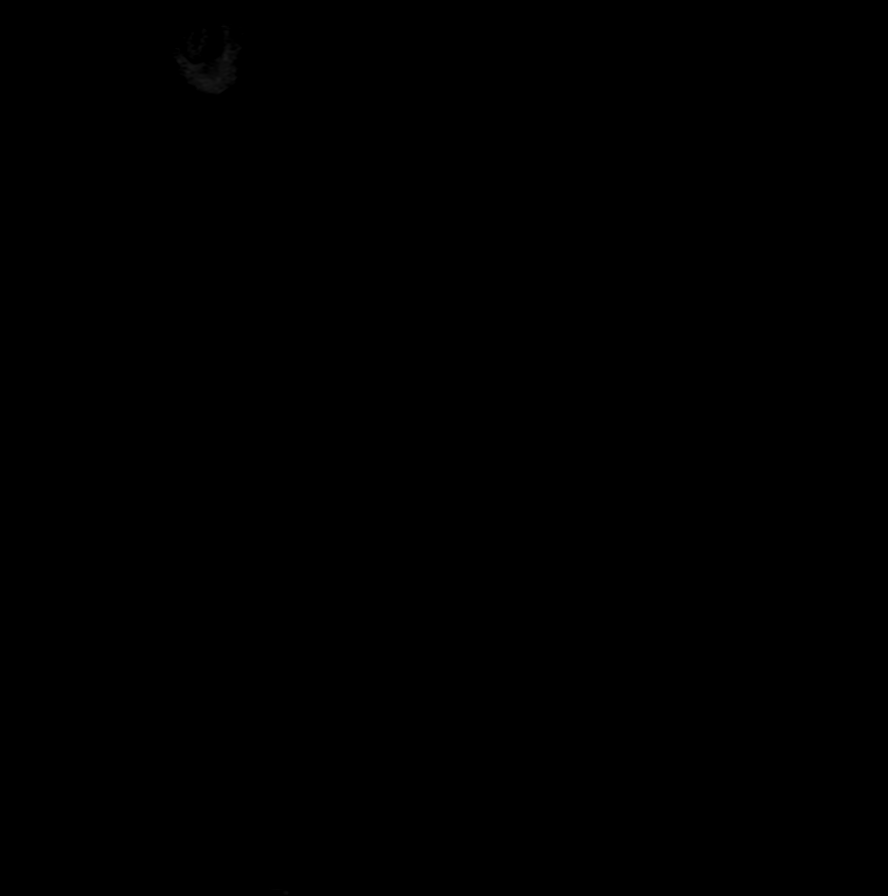
[im 16/32]
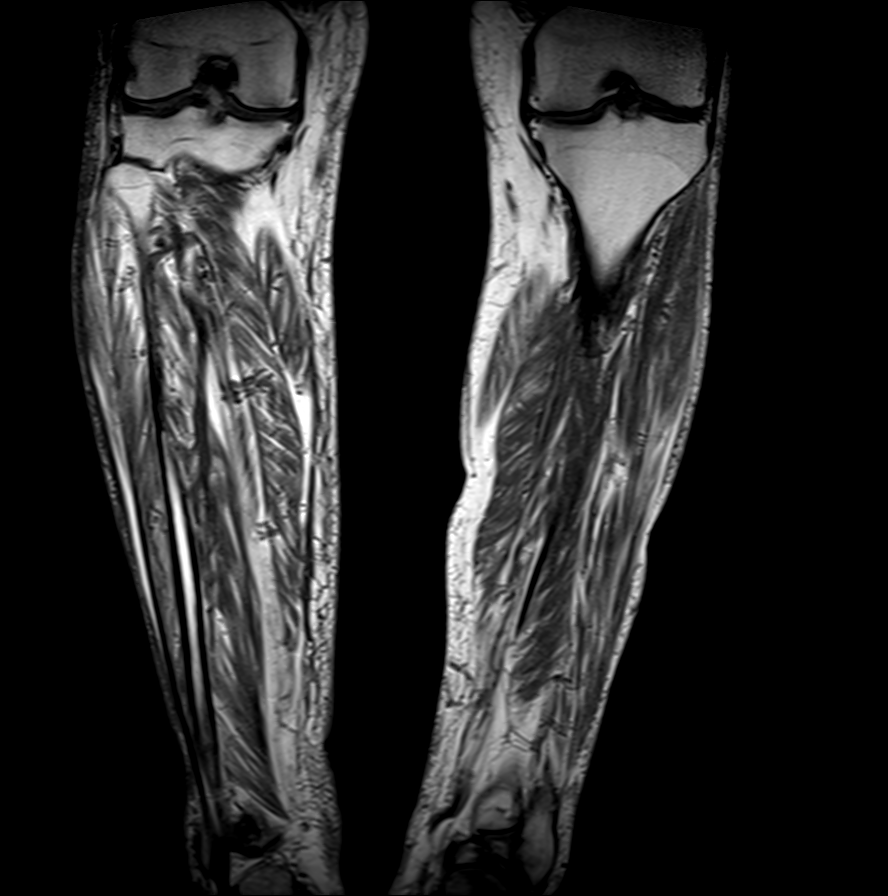
[im 32/32]
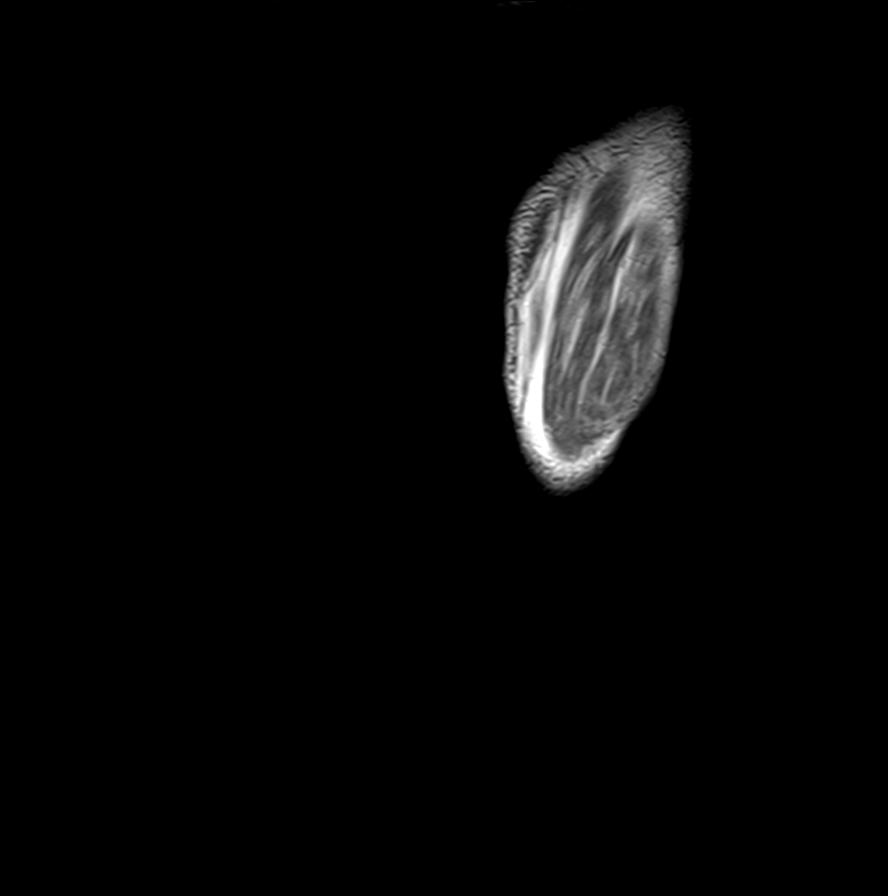

[Series 9: T1 · sagittal · 4.0mm · 0.47mm/px · 3 of 32 slices shown (2 of 2)]
[im 1/32]
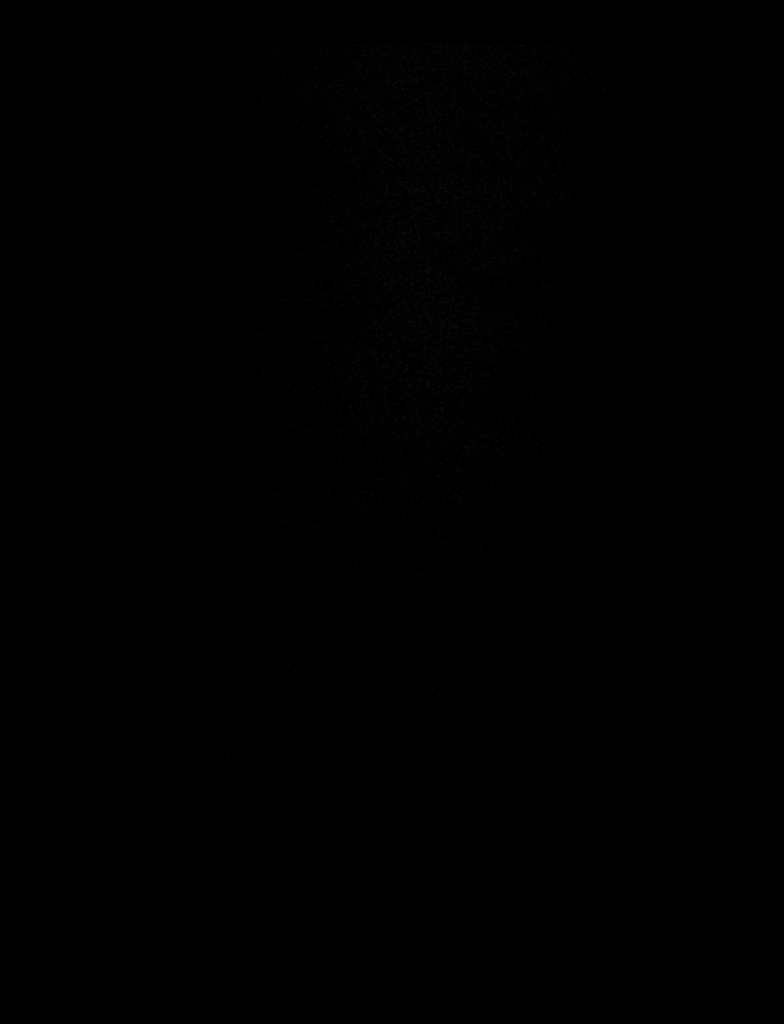
[im 16/32]
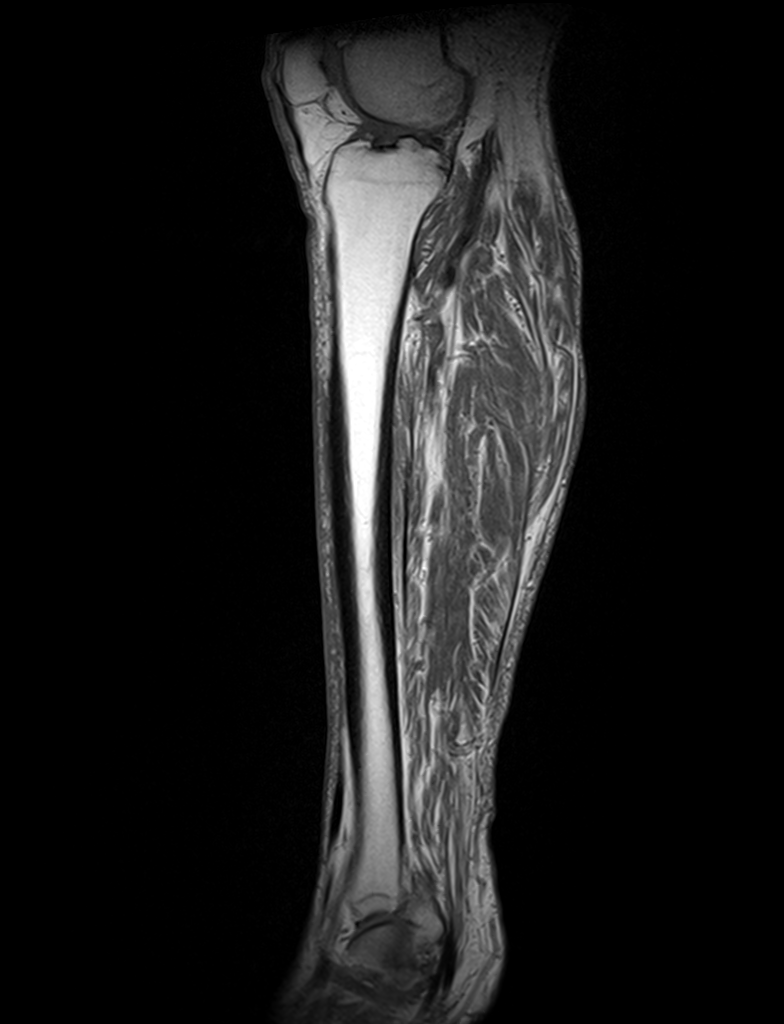
[im 32/32]
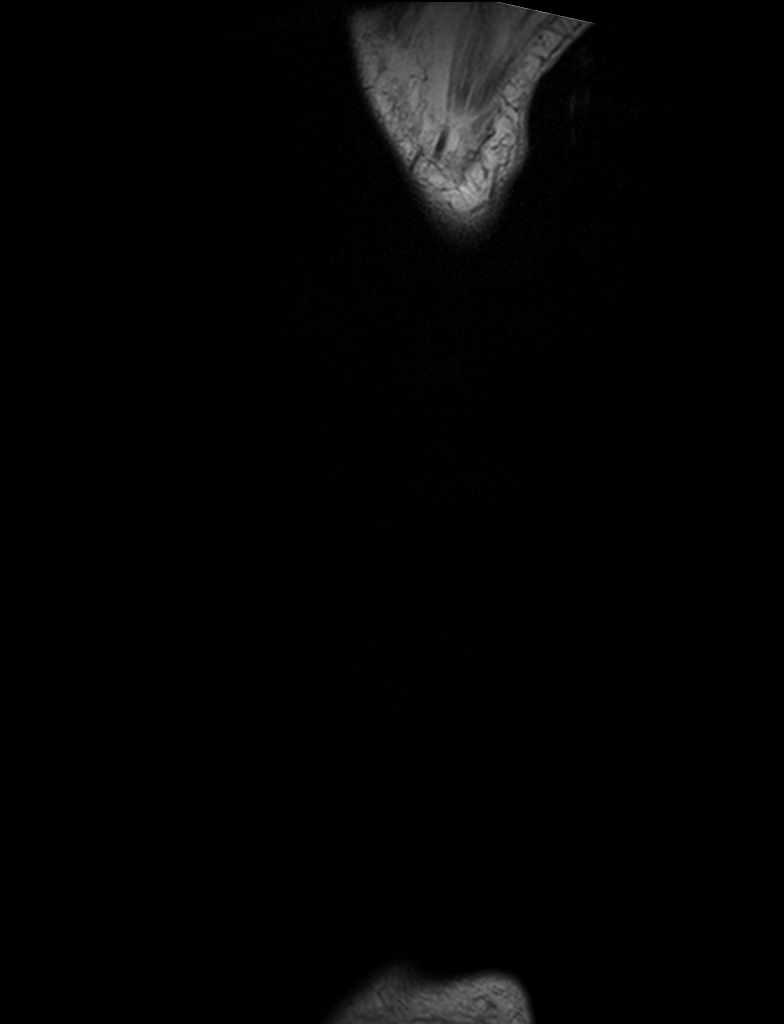

[7 of 40 positions shown; findings below may reference images not displayed]

FINDINGS: There is no marrow signal abnormality. There is no acute fracture or
dislocation. There is no periosteal reaction. There is no focal
muscle signal abnormality. There is no abnormal muscle enhancement.
There is soft tissue edema involving the right lower leg
circumferentially with mild enhancement. There is no drainable fluid
collection.

Partially visualized is osteoarthritis of the medial and lateral
femorotibial compartments bilaterally.
IMPRESSION: 1. Cellulitis of the right lower leg without a drainable abscess.
2. No osseous abnormality.

## 2016-07-11 ENCOUNTER — Other Ambulatory Visit (INDEPENDENT_AMBULATORY_CARE_PROVIDER_SITE_OTHER): Payer: Medicare Other

## 2016-07-11 ENCOUNTER — Ambulatory Visit (INDEPENDENT_AMBULATORY_CARE_PROVIDER_SITE_OTHER): Payer: Medicare Other | Admitting: Internal Medicine

## 2016-07-11 ENCOUNTER — Encounter: Payer: Self-pay | Admitting: Internal Medicine

## 2016-07-11 VITALS — BP 140/80 | HR 74 | Temp 97.9°F | Ht 67.0 in | Wt 287.0 lb

## 2016-07-11 DIAGNOSIS — E118 Type 2 diabetes mellitus with unspecified complications: Secondary | ICD-10-CM

## 2016-07-11 DIAGNOSIS — I10 Essential (primary) hypertension: Secondary | ICD-10-CM

## 2016-07-11 DIAGNOSIS — Z794 Long term (current) use of insulin: Secondary | ICD-10-CM | POA: Diagnosis not present

## 2016-07-11 LAB — URINALYSIS, ROUTINE W REFLEX MICROSCOPIC
Bilirubin Urine: NEGATIVE
Hgb urine dipstick: NEGATIVE
Ketones, ur: NEGATIVE
Nitrite: NEGATIVE
RBC / HPF: NONE SEEN (ref 0–?)
Specific Gravity, Urine: 1.01 (ref 1.000–1.030)
Urine Glucose: NEGATIVE
Urobilinogen, UA: 0.2 (ref 0.0–1.0)
pH: 6 (ref 5.0–8.0)

## 2016-07-11 LAB — MICROALBUMIN / CREATININE URINE RATIO
Creatinine,U: 77 mg/dL
Microalb Creat Ratio: 25.8 mg/g (ref 0.0–30.0)
Microalb, Ur: 19.9 mg/dL — ABNORMAL HIGH (ref 0.0–1.9)

## 2016-07-11 LAB — BASIC METABOLIC PANEL
BUN: 15 mg/dL (ref 6–23)
CO2: 38 mEq/L — ABNORMAL HIGH (ref 19–32)
Calcium: 9.2 mg/dL (ref 8.4–10.5)
Chloride: 100 mEq/L (ref 96–112)
Creatinine, Ser: 1 mg/dL (ref 0.40–1.50)
GFR: 79.53 mL/min (ref >60.00)
Glucose, Bld: 165 mg/dL — ABNORMAL HIGH (ref 70–99)
Potassium: 3.8 mEq/L (ref 3.5–5.1)
Sodium: 144 mEq/L (ref 135–145)

## 2016-07-11 LAB — HEMOGLOBIN A1C: Hgb A1c MFr Bld: 8.3 % — ABNORMAL HIGH (ref 4.6–6.5)

## 2016-07-11 NOTE — Progress Notes (Signed)
Subjective:  Patient ID: Cody Hale, male    DOB: Nov 04, 1950  Age: 66 y.o. MRN: [id number]  CC: Diabetes   HPI Cody Hale presents for follow-up on hypertension and diabetes. He tells me his blood sugars have all been below 200s and he denies polys. Unfortunately, he has had some dietary indiscretions and has gained weight. He tells me his blood pressure has been well controlled and he has had no episodes of headache/blurred vision/chest pain/shortness of breath/palpitations/edema/or fatigue.  Outpatient Medications Prior to Visit  Medication Sig Dispense Refill  . azelastine (ASTELIN) 0.1 % nasal spray Place 1 spray into both nostrils 2 (two) times daily. Use in each nostril as directed 30 mL 12  . carvedilol (COREG) 6.25 MG tablet TAKE 1 TABLET BY MOUTH TWICE DAILY. 60 tablet 11  . fluticasone (FLONASE) 50 MCG/ACT nasal spray Place 2 sprays into both nostrils daily. 16 g 11  . metFORMIN (GLUCOPHAGE-XR) 750 MG 24 hr tablet Take 2 tablets (1,500 mg total) by mouth daily with breakfast. 180 tablet 1  . Multiple Vitamin (DAILY VITE) TABS TAKE 1 TABLET BY MOUTH ONCE DAILY. 30 tablet 11  . nitroGLYCERIN (NITROSTAT) 0.4 MG SL tablet Place 1 tablet (0.4 mg total) under the tongue every 5 (five) minutes x 3 doses as needed for chest pain. 25 tablet 3  . oxyCODONE (OXY IR/ROXICODONE) 5 MG immediate release tablet Take 1 tablet (5 mg total) by mouth every 6 (six) hours as needed for severe pain. 75 tablet 0  . potassium chloride (K-DUR,KLOR-CON) 10 MEQ tablet Take 1 tablet (10 mEq total) by mouth daily. 90 tablet 3  . simvastatin (ZOCOR) 20 MG tablet TAKE 1 TABLET BY MOUTH ONCE DAILY. 30 tablet 11  . torsemide (DEMADEX) 20 MG tablet Take 2 tablets (40 mg total) by mouth 2 (two) times daily. 120 tablet 6  . valsartan (DIOVAN) 40 MG tablet Take 1 tablet (40 mg total) by mouth 2 (two) times daily. 180 tablet 3  . Vilazodone HCl (VIIBRYD) 40 MG TABS Take 1 tablet (40 mg total) by mouth daily. 30  tablet 11  . XARELTO 20 MG TABS tablet TAKE 1 TABLET BY MOUTH ONCE DAILY. 30 tablet 5  . Insulin Glargine (LANTUS SOLOSTAR) 100 UNIT/ML Solostar Pen Inject 30 Units into the skin daily at 10 pm. 15 mL 11  . metoCLOPramide (REGLAN) 5 MG tablet Take 1 tablet (5 mg total) by mouth every 6 (six) hours as needed for nausea. 30 tablet 0   No facility-administered medications prior to visit.     ROS Review of Systems  Constitutional: Positive for unexpected weight change. Negative for activity change, appetite change, diaphoresis and fatigue.  HENT: Negative.   Eyes: Negative for visual disturbance.  Respiratory: Negative for cough, chest tightness, shortness of breath, wheezing and stridor.   Cardiovascular: Negative.  Negative for chest pain, palpitations and leg swelling.  Gastrointestinal: Negative.  Negative for abdominal pain, constipation, diarrhea, nausea and vomiting.  Endocrine: Negative for cold intolerance, heat intolerance, polydipsia, polyphagia and polyuria.  Genitourinary: Negative.  Negative for difficulty urinating, dysuria and frequency.  Musculoskeletal: Negative.  Negative for back pain, myalgias and neck pain.  Skin: Negative.   Allergic/Immunologic: Negative.   Neurological: Negative.  Negative for dizziness, weakness and headaches.  Hematological: Negative.  Negative for adenopathy. Does not bruise/bleed easily.  Psychiatric/Behavioral: Negative.     Objective:  BP 140/80 (BP Location: Left Arm, Patient Position: Sitting, Cuff Size: Large)   Pulse 74  Temp 97.9 F (36.6 C) (Oral)   Ht 5\' 7"  (1.702 m)   Wt 287 lb (130.2 kg)   SpO2 96%   BMI 44.95 kg/m   BP Readings from Last 3 Encounters:  07/11/16 140/80  06/14/16 (!) 144/70  06/05/16 130/70    Wt Readings from Last 3 Encounters:  07/11/16 287 lb (130.2 kg)  06/14/16 289 lb (131.1 kg)  06/05/16 294 lb 8 oz (133.6 kg)    Physical Exam  Constitutional: He is oriented to person, place, and time. No  distress.  HENT:  Mouth/Throat: Oropharynx is clear and moist. No oropharyngeal exudate.  Eyes: Conjunctivae are normal. Right eye exhibits no discharge. Left eye exhibits no discharge. No scleral icterus.  Neck: Normal range of motion. Neck supple. No JVD present. No tracheal deviation present. No thyromegaly present.  Cardiovascular: Normal rate, regular rhythm, normal heart sounds and intact distal pulses.  Exam reveals no gallop and no friction rub.   No murmur heard. Pulmonary/Chest: Effort normal and breath sounds normal. No stridor. No respiratory distress. He has no wheezes. He has no rales. He exhibits no tenderness.  Abdominal: Soft. Bowel sounds are normal. He exhibits no distension. There is no tenderness. There is no rebound and no guarding.  Musculoskeletal: Normal range of motion. He exhibits no edema, tenderness or deformity.  Lymphadenopathy:    He has no cervical adenopathy.  Neurological: He is oriented to person, place, and time.  Skin: Skin is warm and dry. No rash noted. He is not diaphoretic. No erythema. No pallor.  Vitals reviewed.   Lab Results  Component Value Date   WBC 8.5 05/20/2016   HGB 14.5 05/20/2016   HCT 43.7 05/20/2016   PLT 128 (L) 05/20/2016   GLUCOSE 165 (H) 07/11/2016   CHOL 102 03/13/2016   TRIG 108.0 03/13/2016   HDL 46.80 03/13/2016   LDLDIRECT 84.2 01/31/2014   LDLCALC 34 03/13/2016   ALT 30 05/20/2016   AST 25 05/20/2016   NA 144 07/11/2016   K 3.8 07/11/2016   CL 100 07/11/2016   CREATININE 1.00 07/11/2016   BUN 15 07/11/2016   CO2 38 (H) 07/11/2016   TSH 0.943 05/20/2016   PSA 0.36 05/30/2014   INR 1.13 11/30/2012   HGBA1C 8.3 (H) 07/11/2016   MICROALBUR 19.9 (H) 07/11/2016    Dg Chest 2 View  Result Date: 05/20/2016 CLINICAL DATA:  Nausea and vomiting, onset last night after eating. Epigastric abdominal pain. EXAM: CHEST  2 VIEW COMPARISON:  05/05/2016 FINDINGS: There is unchanged moderate cardiomegaly. Mild  hyperinflation. There is extensive interstitial thickening which probably represents interstitial edema. No airspace consolidation. No effusion. Hilar and mediastinal contours are unremarkable and unchanged. There is prior median sternotomy. IMPRESSION: Cardiomegaly and extensive interstitial thickening. This may represent interstitial edema. No consolidation. No effusion. Electronically Signed   By: Ellery Plunk M.D.   On: 05/20/2016 03:17   Ct Abdomen Pelvis W Contrast  Result Date: 05/20/2016 CLINICAL DATA:  Nausea and vomiting, onset last night after eating. Epigastric abdominal pain. EXAM: CT ABDOMEN AND PELVIS WITH CONTRAST TECHNIQUE: Multidetector CT imaging of the abdomen and pelvis was performed using the standard protocol following bolus administration of intravenous contrast. CONTRAST:  ISOVUE-300 IOPAMIDOL (ISOVUE-300) INJECTION 61%, 1 ISOVUE-300 IOPAMIDOL (ISOVUE-300) INJECTION 61% COMPARISON:  05/25/2011 FINDINGS: Lower chest: Interlobular septal thickening may represent interstitial edema. No pleural or pericardial effusions. Hepatobiliary: No focal liver abnormality is seen. No gallstones, gallbladder wall thickening, or biliary dilatation. Pancreas: Unremarkable. No pancreatic  ductal dilatation or surrounding inflammatory changes. Spleen: Normal in size without focal abnormality. Adrenals/Urinary Tract: Adrenals are unremarkable. Lower pole left collecting system calculi measure up to 3 mm. Hypodense renal lesions measure up to 2 point 3 cm, likely cysts although not conclusively characterized. Ureters are unremarkable. Urinary bladder is unremarkable. Stomach/Bowel: Stomach is within normal limits. Appendix appears normal. No evidence of bowel wall thickening, distention, or inflammatory changes. Vascular/Lymphatic: Aortic atherosclerosis. No enlarged abdominal or pelvic lymph nodes. Reproductive: Unremarkable Other: No acute inflammatory changes are evident in the abdomen or  pelvis. There is no ascites. Musculoskeletal: No significant skeletal lesions. No acute bony abnormalities. Moderately severe right hip arthritis. Mild to moderate left hip arthritis. Extensive lumbar degenerative disc and facet disease. IMPRESSION: 1. No acute findings are evident in the abdomen or pelvis. 2. Lung bases exhibit interlobular septal thickening, suggesting interstitial edema. 3. Lower pole left nephrolithiasis, nonobstructing. Electronically Signed   By: Ellery Plunk M.D.   On: 05/20/2016 03:25    Assessment & Plan:   Joan was seen today for diabetes.  Diagnoses and all orders for this visit:  Essential hypertension, malignant- his blood pressure is well-controlled, electrolytes and renal function are normal. -     Basic metabolic panel; Future -     Urinalysis, Routine w reflex microscopic; Future  Type 2 diabetes mellitus with complication, with long-term current use of insulin (HCC)- His A1c is up to 8.3%, I have recommended that he increase his Lantus dose from 30 units a day to 50 units a day. Will continue metformin at the current dose. I've asked him to improve on his lifestyle modifications as well. -     Basic metabolic panel; Future -     Hemoglobin A1c; Future -     Microalbumin / creatinine urine ratio; Future -     Insulin Glargine (LANTUS SOLOSTAR) 100 UNIT/ML Solostar Pen; Inject 50 Units into the skin daily at 10 pm.   I have discontinued Mr. Mckechnie metoCLOPramide. I have also changed his Insulin Glargine. Additionally, I am having him maintain his nitroGLYCERIN, Vilazodone HCl, DAILY VITE, XARELTO, carvedilol, simvastatin, valsartan, metFORMIN, fluticasone, azelastine, oxyCODONE, torsemide, and potassium chloride.  Meds ordered this encounter  Medications  . Insulin Glargine (LANTUS SOLOSTAR) 100 UNIT/ML Solostar Pen    Sig: Inject 50 Units into the skin daily at 10 pm.    Dispense:  15 mL    Refill:  11     Follow-up: Return in about 6 months  (around 01/08/2017).  Sanda Linger, MD

## 2016-07-11 NOTE — Patient Instructions (Signed)

## 2016-07-11 NOTE — Progress Notes (Signed)
Pre visit review using our clinic review tool, if applicable. No additional management support is needed unless otherwise documented below in the visit note. 

## 2016-07-14 MED ORDER — INSULIN GLARGINE 100 UNIT/ML SOLOSTAR PEN: 50.0000 [IU] | PEN_INJECTOR | Freq: Every day | SUBCUTANEOUS | 11 refills | Status: DC

## 2016-07-15 ENCOUNTER — Ambulatory Visit: Payer: Self-pay | Admitting: Adult Health
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
## 2016-07-23 ENCOUNTER — Encounter: Payer: Self-pay | Admitting: Adult Health

## 2016-07-23 ENCOUNTER — Ambulatory Visit (INDEPENDENT_AMBULATORY_CARE_PROVIDER_SITE_OTHER): Payer: Medicare Other | Admitting: Adult Health

## 2016-07-23 VITALS — BP 138/70 | HR 87 | Ht 69.0 in | Wt 287.0 lb

## 2016-07-23 DIAGNOSIS — I5022 Chronic systolic (congestive) heart failure: Secondary | ICD-10-CM | POA: Diagnosis not present

## 2016-07-23 DIAGNOSIS — I10 Essential (primary) hypertension: Secondary | ICD-10-CM

## 2016-07-23 DIAGNOSIS — I4819 Other persistent atrial fibrillation: Secondary | ICD-10-CM

## 2016-07-23 DIAGNOSIS — I481 Persistent atrial fibrillation: Secondary | ICD-10-CM | POA: Diagnosis not present

## 2016-07-23 NOTE — Patient Instructions (Signed)
Your physician wants you to follow-up in: 6 Months with Dr. Koneswaran. You will receive a reminder letter in the mail two months in advance. If you don't receive a letter, please call our office to schedule the follow-up appointment.  Your physician recommends that you continue on your current medications as directed. Please refer to the Current Medication list given to you today.  If you need a refill on your cardiac medications before your next appointment, please call your pharmacy.  Thank you for choosing  [facility]!   
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
## 2016-07-23 NOTE — Progress Notes (Signed)
Name: Cody Hale    DOB: 1951/01/05  Age: 66 y.o.  MR#: [id number]       PCP:  Sanda Linger, MD      Insurance: Payor: MEDICARE / Plan: MEDICARE PART A AND B / Product Type: *No Product type* /   CC:   No chief complaint on file.   VS Vitals:   07/23/16 1348  BP: 138/70  Pulse: 87  SpO2: 95%  Weight: 287 lb (130.2 kg)  Height: 5\' 9"  (1.753 m)    Weights Current Weight  07/23/16 287 lb (130.2 kg)  07/11/16 287 lb (130.2 kg)  06/14/16 289 lb (131.1 kg)    Blood Pressure  BP Readings from Last 3 Encounters:  07/23/16 138/70  07/11/16 140/80  06/14/16 (!) 144/70     Admit date:  (Not on file) Last encounter with [facility]:  06/14/2016   Allergy Daptomycin; Lisinopril; and Tape  Current Outpatient Prescriptions  Medication Sig Dispense Refill  . azelastine (ASTELIN) 0.1 % nasal spray Place 1 spray into both nostrils 2 (two) times daily. Use in each nostril as directed 30 mL 12  . carvedilol (COREG) 6.25 MG tablet TAKE 1 TABLET BY MOUTH TWICE DAILY. 60 tablet 11  . fluticasone (FLONASE) 50 MCG/ACT nasal spray Place 2 sprays into both nostrils daily. 16 g 11  . Insulin Glargine (LANTUS SOLOSTAR) 100 UNIT/ML Solostar Pen Inject 50 Units into the skin daily at 10 pm. 15 mL 11  . metFORMIN (GLUCOPHAGE-XR) 750 MG 24 hr tablet Take 2 tablets (1,500 mg total) by mouth daily with breakfast. 180 tablet 1  . Multiple Vitamin (DAILY VITE) TABS TAKE 1 TABLET BY MOUTH ONCE DAILY. 30 tablet 11  . nitroGLYCERIN (NITROSTAT) 0.4 MG SL tablet Place 1 tablet (0.4 mg total) under the tongue every 5 (five) minutes x 3 doses as needed for chest pain. 25 tablet 3  . oxyCODONE (OXY IR/ROXICODONE) 5 MG immediate release tablet Take 1 tablet (5 mg total) by mouth every 6 (six) hours as needed for severe pain. 75 tablet 0  . potassium chloride (K-DUR,KLOR-CON) 10 MEQ tablet Take 1 tablet (10 mEq total) by mouth daily. 90 tablet 3  . simvastatin (ZOCOR) 20 MG tablet TAKE 1 TABLET BY MOUTH ONCE DAILY. 30  tablet 11  . torsemide (DEMADEX) 20 MG tablet Take 2 tablets (40 mg total) by mouth 2 (two) times daily. 120 tablet 6  . valsartan (DIOVAN) 40 MG tablet Take 1 tablet (40 mg total) by mouth 2 (two) times daily. 180 tablet 3  . Vilazodone HCl (VIIBRYD) 40 MG TABS Take 1 tablet (40 mg total) by mouth daily. 30 tablet 11  . XARELTO 20 MG TABS tablet TAKE 1 TABLET BY MOUTH ONCE DAILY. 30 tablet 5   No current facility-administered medications for this visit.     Discontinued Meds:   There are no discontinued medications.  Patient Active Problem List   Diagnosis Date Noted  . Acute on chronic systolic CHF (congestive heart failure) (HCC) 05/05/2016  . Allergic rhinitis due to pollen 03/13/2016  . Candida rash of groin 12/12/2015  . Chronic systolic heart failure (HCC) 01/11/2015  . Chronic atrial fibrillation (HCC) 01/11/2015  . S/P mitral valve replacement with bioprosthetic valve 01/11/2015  . Depression with somatization 11/22/2014  . Primary osteoarthritis of both knees 11/22/2014  . COPD (chronic obstructive pulmonary disease) with chronic bronchitis (HCC) 05/12/2014  . B12 deficiency anemia 02/03/2014  . Routine general medical examination at a health care facility 03/21/2013  .  Hyperlipidemia with target LDL less than 70 03/19/2013  . OA (osteoarthritis) 03/19/2013  . NSTEMI (non-ST elevated myocardial infarction) (HCC) 03/09/2013  . Essential hypertension, malignant 12/01/2012  . Type II diabetes mellitus with manifestations (HCC)   . Gastroparesis 11/30/2012  . OSA (obstructive sleep apnea) 11/16/2012  . Endocarditis-resolved 08/26/2012  . LBBB (left bundle branch block) 07/03/2012  . Morbid obesity (HCC) 06/27/2012    LABS    Component Value Date/Time   NA 144 07/11/2016 1154   NA 141 06/17/2016 0900   NA 136 05/24/2016 0659   K 3.8 07/11/2016 1154   K 3.8 06/17/2016 0900   K 3.9 05/24/2016 0659   CL 100 07/11/2016 1154   CL 98 06/17/2016 0900   CL 95 (L)  05/24/2016 0659   CO2 38 (H) 07/11/2016 1154   CO2 35 (H) 06/17/2016 0900   CO2 32 05/24/2016 0659   GLUCOSE 165 (H) 07/11/2016 1154   GLUCOSE 199 (H) 06/17/2016 0900   GLUCOSE 187 (H) 05/24/2016 0659   BUN 15 07/11/2016 1154   BUN 18 06/17/2016 0900   BUN 14 05/24/2016 0659   CREATININE 1.00 07/11/2016 1154   CREATININE 1.11 06/17/2016 0900   CREATININE 0.96 05/24/2016 0659   CREATININE 0.87 05/23/2016 0654   CREATININE 0.81 06/13/2015 0735   CREATININE 0.86 08/12/2012 1500   CALCIUM 9.2 07/11/2016 1154   CALCIUM 8.7 06/17/2016 0900   CALCIUM 9.1 05/24/2016 0659   GFRNONAA >60 05/24/2016 0659   GFRNONAA >60 05/23/2016 0654   GFRNONAA >60 05/22/2016 0720   GFRAA >60 05/24/2016 0659   GFRAA >60 05/23/2016 0654   GFRAA >60 05/22/2016 0720   CMP     Component Value Date/Time   NA 144 07/11/2016 1154   K 3.8 07/11/2016 1154   CL 100 07/11/2016 1154   CO2 38 (H) 07/11/2016 1154   GLUCOSE 165 (H) 07/11/2016 1154   BUN 15 07/11/2016 1154   CREATININE 1.00 07/11/2016 1154   CREATININE 1.11 06/17/2016 0900   CALCIUM 9.2 07/11/2016 1154   PROT 7.2 05/20/2016 0142   ALBUMIN 4.1 05/20/2016 0142   AST 25 05/20/2016 0142   ALT 30 05/20/2016 0142   ALKPHOS 71 05/20/2016 0142   BILITOT 0.9 05/20/2016 0142   GFRNONAA >60 05/24/2016 0659   GFRAA >60 05/24/2016 0659       Component Value Date/Time   WBC 8.5 05/20/2016 0142   WBC 8.2 05/12/2016 1016   WBC 9.5 05/05/2016 0924   HGB 14.5 05/20/2016 0142   HGB 14.7 05/12/2016 1016   HGB 13.8 05/05/2016 0924   HCT 43.7 05/20/2016 0142   HCT 44.6 05/12/2016 1016   HCT 42.6 05/05/2016 0924   MCV 94.8 05/20/2016 0142   MCV 93.5 05/12/2016 1016   MCV 97.5 05/05/2016 0924    Lipid Panel     Component Value Date/Time   CHOL 102 03/13/2016 1132   TRIG 108.0 03/13/2016 1132   HDL 46.80 03/13/2016 1132   CHOLHDL 2 03/13/2016 1132   VLDL 21.6 03/13/2016 1132   LDLCALC 34 03/13/2016 1132   LDLDIRECT 84.2 01/31/2014 1502     ABG    Component Value Date/Time   PHART 7.306 (L) 05/05/2016 0125   PCO2ART 57.6 (H) 05/05/2016 0125   PO2ART 464.0 (H) 05/05/2016 0125   HCO3 25.0 05/05/2016 0125   TCO2 29.2 03/09/2013 1820   ACIDBASEDEF 1.0 08/28/2012 0922   O2SAT 99.7 05/05/2016 0125     Lab Results  Component Value Date   TSH  0.943 05/20/2016   BNP (last 3 results)  Recent Labs  05/04/16 2121 05/20/16 0142  BNP 96.0 144.0*    ProBNP (last 3 results)  Recent Labs  10/12/15 0930  PROBNP 157.0*    Cardiac Panel (last 3 results) No results for input(s): CKTOTAL, CKMB, TROPONINI, RELINDX in the last 72 hours.  Iron/TIBC/Ferritin/ %Sat    Component Value Date/Time   IRON 57 03/19/2013 1508   TIBC 308 07/07/2012 0600   FERRITIN 34.6 03/19/2013 1508   IRONPCTSAT 14.1 (L) 03/19/2013 1508     EKG Orders placed or performed during the hospital encounter of 05/20/16  . ED EKG  . ED EKG  . EKG     Prior Assessment and Plan Problem List as of 07/23/2016 Reviewed: 07/11/2016 11:41 AM by Sanda Linger, MD     Cardiovascular and Mediastinum   LBBB (left bundle branch block)   Essential hypertension, malignant   Last Assessment & Plan 01/11/2015 Office Visit Written 01/11/2015 12:52 PM by Dyann Kief, PA-C    Patient's blood pressure is controlled      Endocarditis-resolved   NSTEMI (non-ST elevated myocardial infarction) [facility])   Chronic systolic heart failure [facility])   Last Assessment & Plan 08/18/2015 Office Visit Written 08/20/2015  7:11 AM by Carollee Leitz, NP    Ordered daily weights at assisted living facility Asked them to contact either cardiologist are PCP regarding 3 pound weight gain or greater between consecutive days.  Unsure if the weight at last visit was input incorrectly it could've been 274, but patient is unaware of any significant weight gain.      Chronic atrial fibrillation [facility])   Last Assessment & Plan 01/11/2015 Office Visit Written 01/11/2015 12:53 PM by Dyann Kief,  PA-C    Increasing Coreg to 6.25 mg twice a day.CHADSVASC is 3. Continue Xarelto      Acute on chronic systolic CHF (congestive heart failure) (HCC)     Respiratory   OSA (obstructive sleep apnea)   Last Assessment & Plan 11/12/2012 Office Visit Written 11/16/2012 11:19 PM by Jeoffrey Massed, MD    Refer to pulmonology for further e/m of possible OSA.      COPD (chronic obstructive pulmonary disease) with chronic bronchitis [facility])   Last Assessment & Plan 05/12/2014 Office Visit Edited 05/15/2014 12:05 PM by [name], MD    Will start incruse for this - I gave him a sample and I showed him how to use it, he demonstrated proficiency with its use      Allergic rhinitis due to pollen     Digestive   Gastroparesis     Endocrine   Type II diabetes mellitus with manifestations [facility])   Last Assessment & Plan 06/21/2014 Office Visit Written 06/21/2014  7:54 PM by [name], MD    His A1C = 10.0 Will cont janumet but will also add basal insulin and invokana a well for better blood sugar control        Musculoskeletal and Integument   OA (osteoarthritis)   Last Assessment & Plan 06/21/2014 Office Visit Written 06/21/2014  7:56 PM by [name], MD    He complains that there is pain and instability in the right hip and thigh area, I asked him to see sports med for further evaluation        Other   Morbid obesity [facility])   Last Assessment & Plan 03/19/2013 Office Visit Written 03/21/2013 10:26 AM by [name],  MD    He will work on his lifestyle modifications to lose weight      Hyperlipidemia with target LDL less than 70   Last Assessment & Plan 05/30/2014 Office Visit Written 06/01/2014  2:20 PM by [name], MD    Improvement noted in his trigs Will follow for now      Routine general medical examination at a health care facility   Last Assessment & Plan 03/19/2013 Office Visit Written 03/21/2013 10:29 AM by [name], MD    Exam done Labs ordered and  reviewed Vaccines were updated       B12 deficiency anemia   Last Assessment & Plan 01/31/2014 Office Visit Written 02/03/2014  7:31 AM by [name], MD    Start oral B12 replacement therapy      Depression with somatization   Primary osteoarthritis of both knees   S/P mitral valve replacement with bioprosthetic valve   Last Assessment & Plan 01/11/2015 Office Visit Written 01/11/2015 12:54 PM by Dyann Kief, PA-C    Stable      Candida rash of groin       Imaging: No results found.

## 2016-08-01 ENCOUNTER — Other Ambulatory Visit: Payer: Self-pay | Admitting: Internal Medicine

## 2016-08-01 ENCOUNTER — Telehealth: Payer: Self-pay

## 2016-08-01 NOTE — Telephone Encounter (Signed)
Rq from [facility] sent request to DC the promethazine DM syrup

## 2016-08-01 NOTE — Telephone Encounter (Signed)
yes

## 2016-08-05 ENCOUNTER — Encounter: Payer: Self-pay | Admitting: Vascular Surgery

## 2016-08-05 NOTE — Telephone Encounter (Signed)
Faxed PCP response to [facility]

## 2016-08-06 ENCOUNTER — Telehealth: Payer: Self-pay | Admitting: Emergency Medicine

## 2016-08-06 NOTE — Telephone Encounter (Signed)
Lupita Leash called and stated you faxed her something and she is not able to read it. She is wondering if you can refax this to her. Please advise thanks.

## 2016-08-07 ENCOUNTER — Ambulatory Visit (INDEPENDENT_AMBULATORY_CARE_PROVIDER_SITE_OTHER): Payer: Medicare Other | Admitting: Vascular Surgery

## 2016-08-07 ENCOUNTER — Ambulatory Visit (HOSPITAL_COMMUNITY)
Admission: RE | Admit: 2016-08-07 | Discharge: 2016-08-07 | Disposition: A | Discharge status: Home / Self Care | Payer: Medicare Other | Source: Ambulatory Visit | Attending: Vascular Surgery | Admitting: Vascular Surgery

## 2016-08-07 ENCOUNTER — Encounter: Payer: Self-pay | Admitting: Vascular Surgery

## 2016-08-07 VITALS — BP 131/66 | HR 77 | Temp 98.5°F | Resp 16 | Ht 69.0 in | Wt 283.0 lb

## 2016-08-07 DIAGNOSIS — I872 Venous insufficiency (chronic) (peripheral): Secondary | ICD-10-CM

## 2016-08-07 DIAGNOSIS — R609 Edema, unspecified: Secondary | ICD-10-CM | POA: Diagnosis not present

## 2016-08-07 NOTE — Progress Notes (Signed)
Patient name: Cody Hale MRN: [id number] DOB: 05/27/1951 Sex: male  REASON FOR CONSULT: Bilateral lower extremity edema. Referred by Dr. Romeo Apple.  HPI: Cody Hale is a 66 y.o. male, referred for evaluation of bilateral lower extremity swelling. He states that he has had swelling in both lower extremities for least a couple of years. He worked for many years in the [employer] and was standing for at least 8 hours a day for many years. He noted the gradual onset of leg swelling.  He is unaware of any previous history of DVT or phlebitis.  I have reviewed the records that were sent with the patient. The patient is being evaluated for right total knee replacement. The patient has had a previous valve replacement and is on Xeralto. His BMI is 46. He lives in a nursing home and is therefore felt to be high-risk for MRSA infection.  He did have a right lower extremity venous duplex scan on 01/24/2015 showed no evidence of DVT.  He had a left lower extremity venous duplex scan on 04/09/2016 showed no evidence of DVT of the left lower extremity.  Past Medical History:  Diagnosis Date  . Anxiety   . Cellulitis   . Chronic systolic heart failure (HCC) 11/2012  . Chronic venous insufficiency   . Coronary atherosclerosis of native coronary artery    Mild nonobstructive 08/2012  . Degenerative joint disease   . Diabetes mellitus, type II (HCC)    Gastroparesis; GI care at [facility]  . 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 s/p zyvox rx.  (Cultures never positive); left bundle branch block; H/o SVT; 09/2012: bioprosthetic MVR at [facility]; a. Severe dental caries and cavities s/p multiple extractions.  . Essential hypertension, benign   . 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 Left bundle branch block   . Left leg cellulitis   . Major depressive disorder, recurrent severe without psychotic features (HCC)    BH admission 12/2012  . Morbid obesity  (HCC) 06/27/2012  . Nephrolithiasis   . PSVT (paroxysmal supraventricular tachycardia) (HCC)    Post-op at [facility]  . Urinary tract infection 08/30/2012   Proteus mirabilis    Family History  Problem Relation Age of Onset  . Lung cancer Father     died @ 52  . Alcohol abuse Father   . Heart disease Father   . Diabetes Father   . Arthritis Father   . Ovarian cancer Mother     died @ 77  . Hypertension Sister   . Hypertension Sister   . Hypertension Sister   . Hypertension Brother   . Early death Neg Hx   . Hyperlipidemia Neg Hx   . Kidney disease Neg Hx   . Stroke Neg Hx     SOCIAL HISTORY: Social History   Social History  . Marital status: Married    Spouse name: N/A  . Number of children: N/A  . Years of education: N/A   Occupational History  . Not on file.   Social History Main Topics  . Smoking status: Former Smoker    Packs/day: 1.00    Years: 20.00    Types: Cigarettes    Quit date: 07/08/1992  . Smokeless tobacco: Never Used     Comment: smoked about 1.5ppd x 15 yrs, quit 15 yrs ago.  . Alcohol use No  . Drug use: No  . Sexual activity: Not Currently   Other Topics  Concern  . Not on file   Social History Narrative   Lives in [place] --split with his wife 12/2012.  Has one living daughter, 2 grandchildren.   Had a son who died of a brain tumor at age 71yrs.   No longer works since having mitral valve replacement.   Does not routinely exercise but starts cardiac rehab 11/18/12.   Tob 30 pack-yr hx, quit 1990s.     Alcohol: none in 30 yrs.  Distant history of heavy alcohol use.   No drug use.                   Allergies  Allergen Reactions  . Daptomycin Rash  . Lisinopril Cough  . Tape Rash and Other (See Comments)    Adhesive Tape-Burn skin.    Current Outpatient Prescriptions  Medication Sig Dispense Refill  . azelastine (ASTELIN) 0.1 % nasal spray Place 1 spray into both nostrils 2 (two) times daily. Use in each nostril as directed 30 mL 12    . carvedilol (COREG) 6.25 MG tablet TAKE 1 TABLET BY MOUTH TWICE DAILY. 60 tablet 11  . fluticasone (FLONASE) 50 MCG/ACT nasal spray Place 2 sprays into both nostrils daily. 16 g 11  . Insulin Glargine (LANTUS SOLOSTAR) 100 UNIT/ML Solostar Pen Inject 50 Units into the skin daily at 10 pm. 15 mL 11  . metFORMIN (GLUCOPHAGE-XR) 750 MG 24 hr tablet Take 2 tablets (1,500 mg total) by mouth daily with breakfast. 180 tablet 1  . Multiple Vitamin (DAILY VITE) TABS TAKE 1 TABLET BY MOUTH ONCE DAILY. 30 tablet 11  . nitroGLYCERIN (NITROSTAT) 0.4 MG SL tablet Place 1 tablet (0.4 mg total) under the tongue every 5 (five) minutes x 3 doses as needed for chest pain. 25 tablet 3  . oxyCODONE (OXY IR/ROXICODONE) 5 MG immediate release tablet Take 1 tablet (5 mg total) by mouth every 6 (six) hours as needed for severe pain. 75 tablet 0  . potassium chloride (K-DUR,KLOR-CON) 10 MEQ tablet Take 1 tablet (10 mEq total) by mouth daily. 90 tablet 3  . simvastatin (ZOCOR) 20 MG tablet TAKE 1 TABLET BY MOUTH ONCE DAILY. 30 tablet 11  . torsemide (DEMADEX) 20 MG tablet Take 2 tablets (40 mg total) by mouth 2 (two) times daily. 120 tablet 6  . valsartan (DIOVAN) 40 MG tablet Take 1 tablet (40 mg total) by mouth 2 (two) times daily. 180 tablet 3  . Vilazodone HCl (VIIBRYD) 40 MG TABS Take 1 tablet (40 mg total) by mouth daily. 30 tablet 11  . XARELTO 20 MG TABS tablet TAKE 1 TABLET BY MOUTH ONCE DAILY. 30 tablet 5   No current facility-administered medications for this visit.     REVIEW OF SYSTEMS:  [X]  denotes positive finding, [ ]  denotes negative finding Cardiac  Comments:  Chest pain or chest pressure:    Shortness of breath upon exertion:    Short of breath when lying flat:    Irregular heart rhythm:        Vascular    Pain in calf, thigh, or hip brought on by ambulation: X   Pain in feet at night that wakes you up from your sleep:     Blood clot in your veins:    Leg swelling:  X       Pulmonary     Oxygen at home:    Productive cough:     Wheezing:         Neurologic  Sudden weakness in arms or legs:     Sudden numbness in arms or legs:     Sudden onset of difficulty speaking or slurred speech:    Temporary loss of vision in one eye:     Problems with dizziness:         Gastrointestinal    Blood in stool:     Vomited blood:         Genitourinary    Burning when urinating:     Blood in urine:        Psychiatric    Major depression:         Hematologic    Bleeding problems:    Problems with blood clotting too easily:        Skin    Rashes or ulcers:        Constitutional    Fever or chills:      PHYSICAL EXAM: Vitals:   08/07/16 1418  BP: 131/66  Pulse: 77  Resp: 16  Temp: 98.5 F (36.9 C)  TempSrc: Oral  SpO2: 98%  Weight: 283 lb (128.4 kg)  Height: 5\' 9"  (1.753 m)    GENERAL: The patient is a well-nourished male, in no acute distress. The vital signs are documented above. CARDIAC: There is a regular rate and rhythm.  VASCULAR: I do not detect carotid bruits. He has palpable dorsalis pedis pulses bilaterally. Because of his leg swelling, I cannot palpate posterior tibial pulses. He has moderate bilateral lower extremity swelling. PULMONARY: There is good air exchange bilaterally without wheezing or rales. ABDOMEN: Soft and non-tender with normal pitched bowel sounds.  MUSCULOSKELETAL: There are no major deformities or cyanosis. NEUROLOGIC: No focal weakness or paresthesias are detected. SKIN: He has some hyperpigmentation bilaterally consistent with chronic venous insufficiency. PSYCHIATRIC: The patient has a normal affect.  DATA:   BILATERAL LOWER EXTREMITY VENOUS DUPLEX: I have independently interpreted his bilateral lower extremity venous duplex scan.  On the right side, there is no evidence of DVT or superficial thrombophlebitis. There is deep vein reflux involving the common femoral vein and femoral vein. There is also reflux in the right  saphenofemoral junction, right great saphenous vein, and right small saphenous vein.  On the left side, there is no evidence of DVT or superficial thrombophlebitis. There is deep vein reflux involving the left common femoral vein. There is superficial reflux involving the left saphenofemoral junction and left great saphenous vein.  MEDICAL ISSUES:  CHRONIC VENOUS INSUFFICIENCY: Based on his exam and duplex findings he has evidence of bilateral chronic venous insufficiency. We have discussed the importance of intermittent leg elevation in the proper positioning for this. In addition, I have written him a prescription for knee-high compression stockings with a mild gradient to start. I have encouraged him to avoid prolonged sitting and standing. I have encouraged him to exercise and to continue with his efforts to lose weight. We also discussed the importance of considering water aerobics which I think is very helpful for people with venous insufficiency. I do not think that his chronic venous insufficiency is a contraindication to knee replacement although certainly he is at increased risk for worsening swelling postoperatively. If his swelling progresses in the future he could be considered for knee-high compression stockings with a gradient of 20-30 mmHg. I will see him back as needed.  Waverly Ferrari [facility] [phone]

## 2016-08-07 NOTE — Telephone Encounter (Signed)
This has been refaxed 

## 2016-08-15 ENCOUNTER — Telehealth: Payer: Self-pay | Admitting: Internal Medicine

## 2016-08-15 NOTE — Telephone Encounter (Signed)
Got order for diabetic shoes but is missing the foot exam stating which foot is deformed and why.

## 2016-08-20 ENCOUNTER — Other Ambulatory Visit: Payer: Self-pay | Admitting: Adult Health

## 2016-08-20 ENCOUNTER — Other Ambulatory Visit: Payer: Self-pay | Admitting: Internal Medicine

## 2016-08-20 DIAGNOSIS — E118 Type 2 diabetes mellitus with unspecified complications: Secondary | ICD-10-CM

## 2016-08-20 DIAGNOSIS — F450 Somatization disorder: Secondary | ICD-10-CM

## 2016-08-20 DIAGNOSIS — I5022 Chronic systolic (congestive) heart failure: Secondary | ICD-10-CM

## 2016-08-20 DIAGNOSIS — I10 Essential (primary) hypertension: Secondary | ICD-10-CM

## 2016-08-20 DIAGNOSIS — F329 Major depressive disorder, single episode, unspecified: Secondary | ICD-10-CM

## 2016-08-20 DIAGNOSIS — Z794 Long term (current) use of insulin: Principal | ICD-10-CM

## 2016-08-20 DIAGNOSIS — F32A Depression, unspecified: Secondary | ICD-10-CM

## 2016-08-20 DIAGNOSIS — R600 Localized edema: Secondary | ICD-10-CM

## 2016-08-21 NOTE — Telephone Encounter (Signed)
This has bee refaxed.   Pt has bilateral foot deformity

## 2016-08-23 IMAGING — DX DG KNEE COMPLETE 4+V*L*
4 series · 4 of 4 positions shown · non-contrast
Comparison: None.

CLINICAL DATA: Trip and fall tonight. Abrasions to the knees. Pain.

EXAM:
LEFT KNEE - COMPLETE 4+ VIEW

[knee ap (1 of 3)]
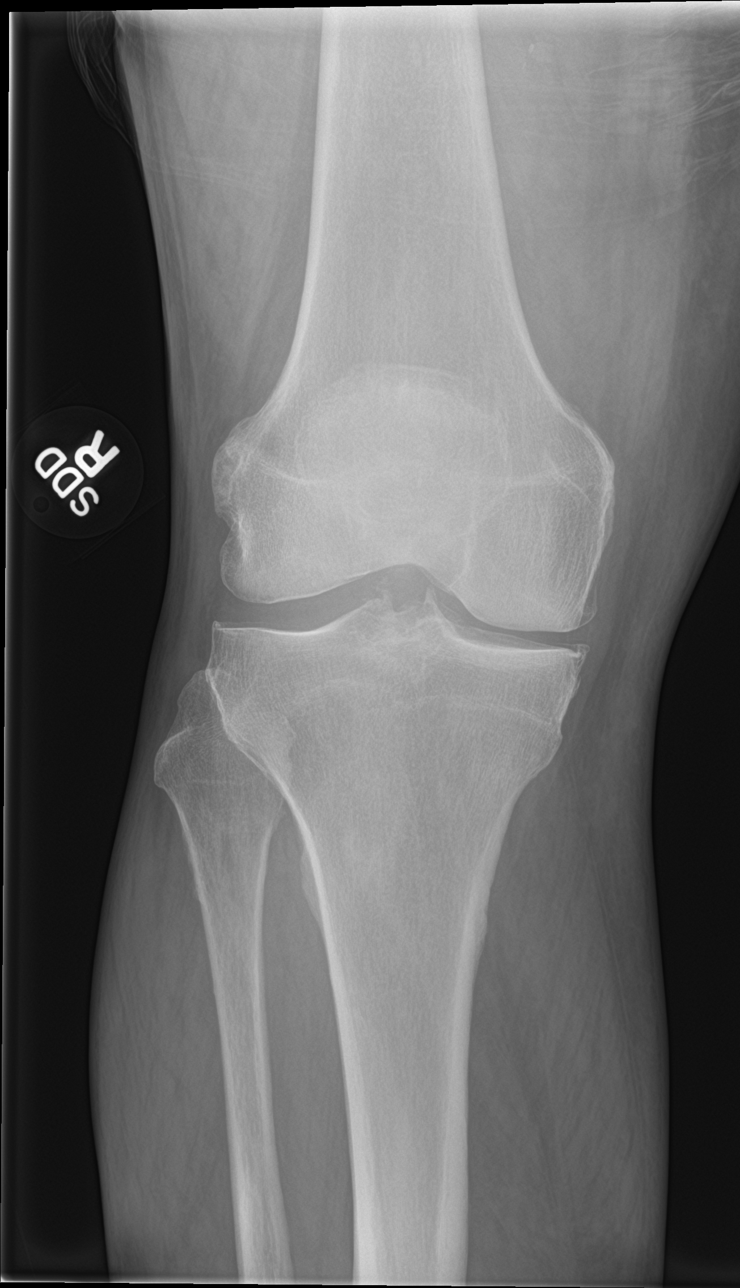

[knee lat]
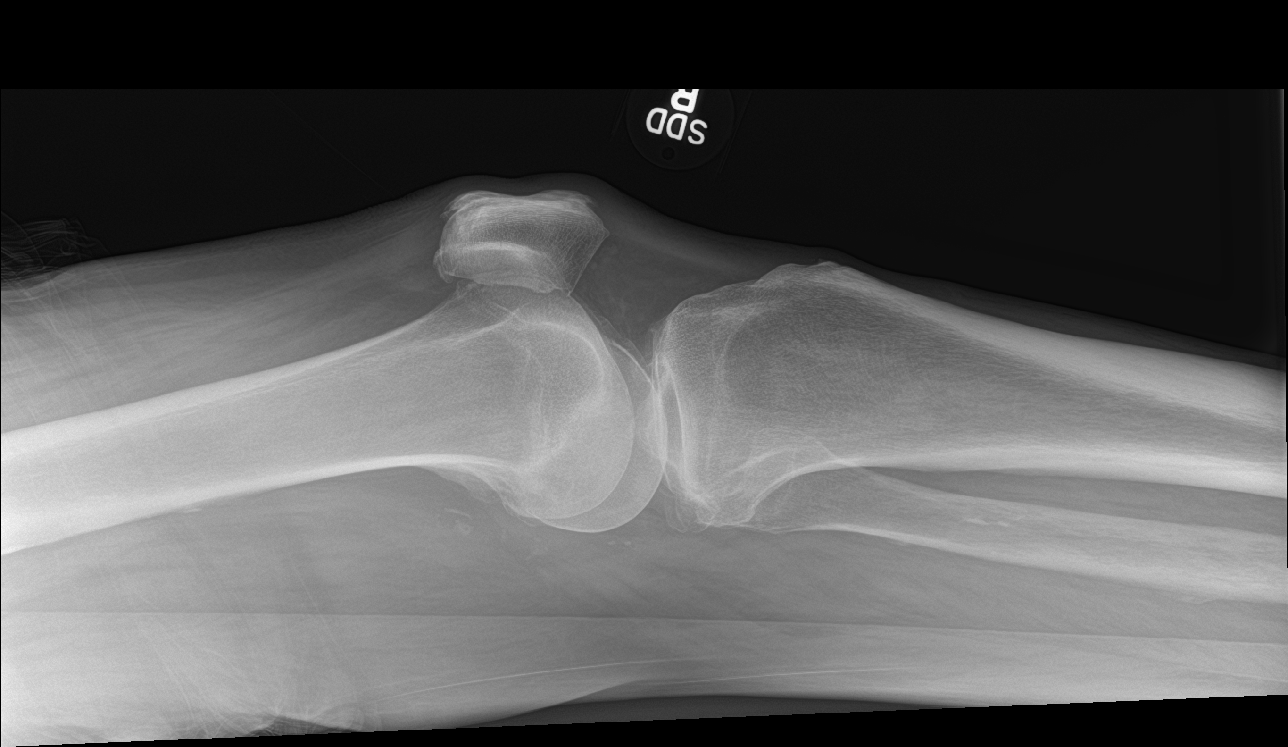

[knee ap (2 of 3)]
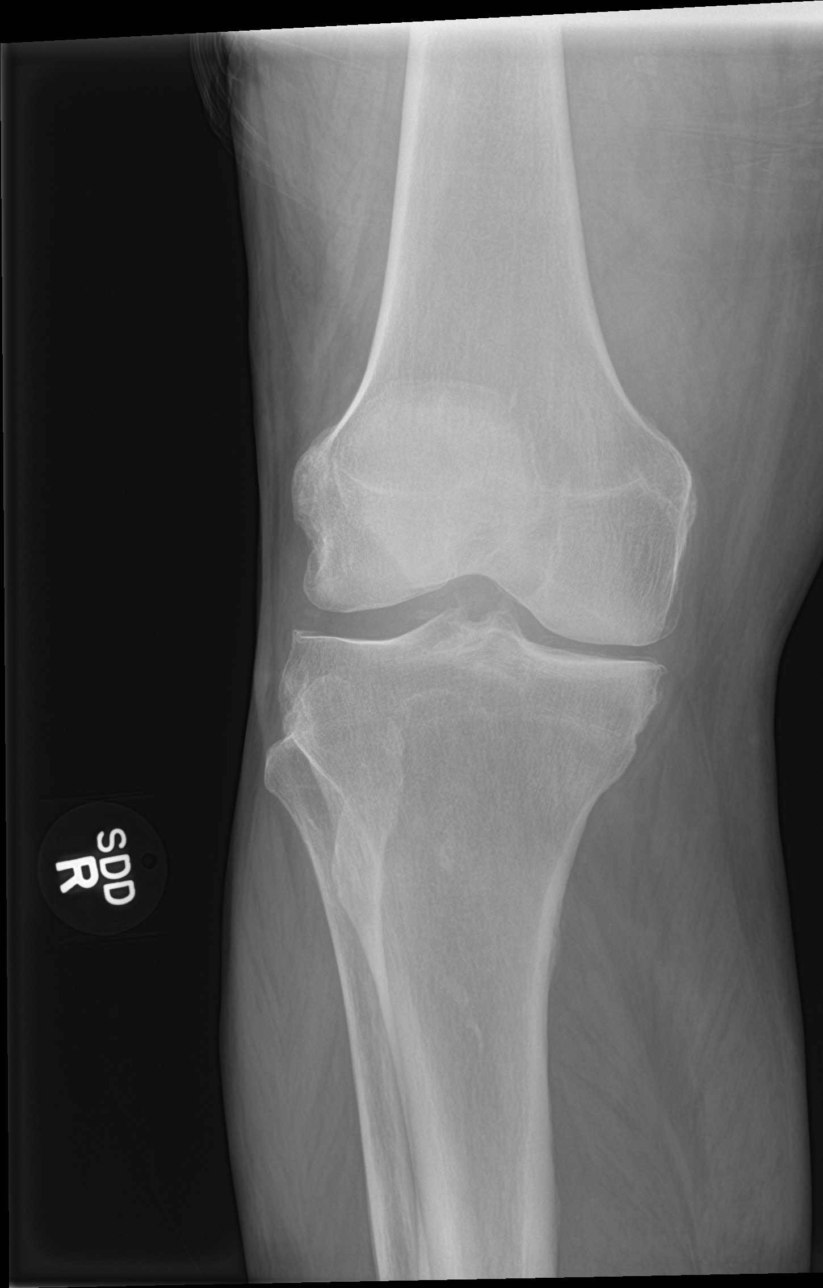

[knee ap (3 of 3)]
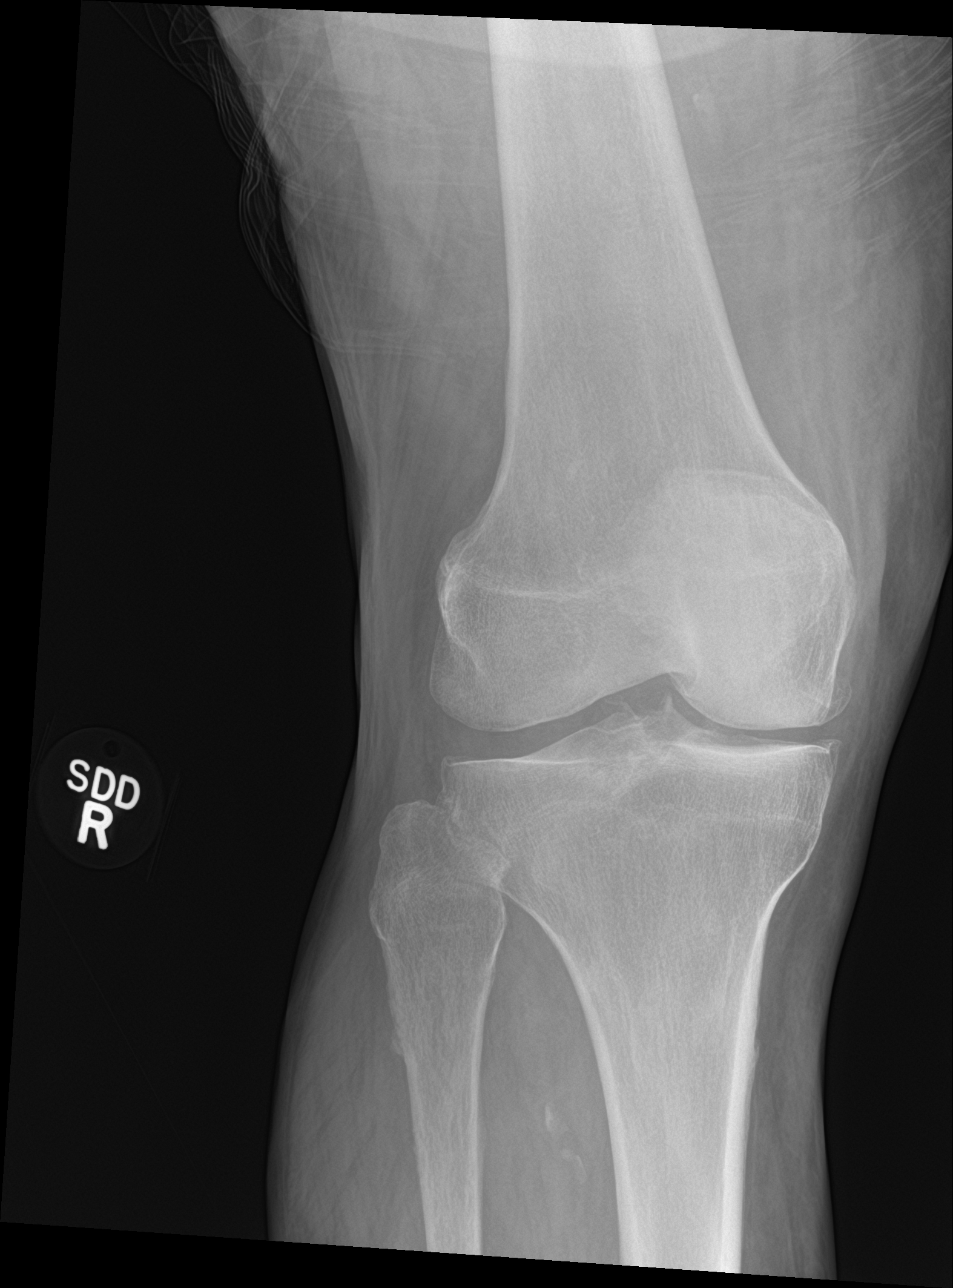

[4 of 4 positions shown; findings below may reference images not displayed]

FINDINGS: Degenerative changes in the right knee with medial greater than
lateral compartment narrowing and tricompartment osteophyte
formation. No significant effusion. No evidence of acute fracture or
dislocation. Vascular calcifications.
IMPRESSION: Mild tricompartment degenerative changes in the right knee. No acute
bony abnormalities.

## 2016-08-23 IMAGING — DX DG KNEE COMPLETE 4+V*R*
4 series · 4 of 4 positions shown · non-contrast
Comparison: None.

CLINICAL DATA: Trip and fall injury tonight. Abrasions and pain to
the knees.

EXAM:
RIGHT KNEE - COMPLETE 4+ VIEW

[knee ap (1 of 3)]
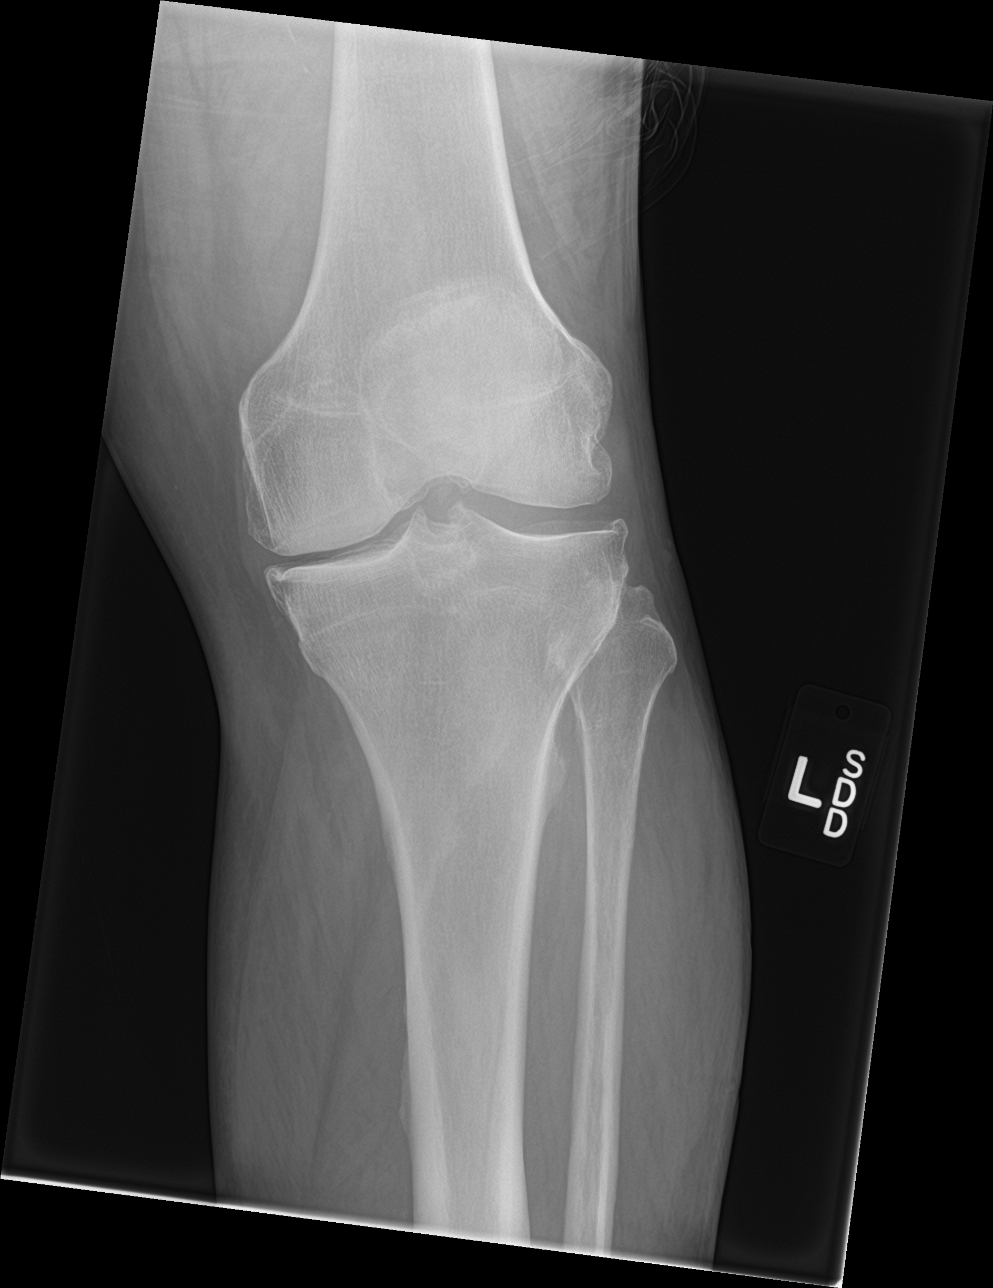

[knee lat]
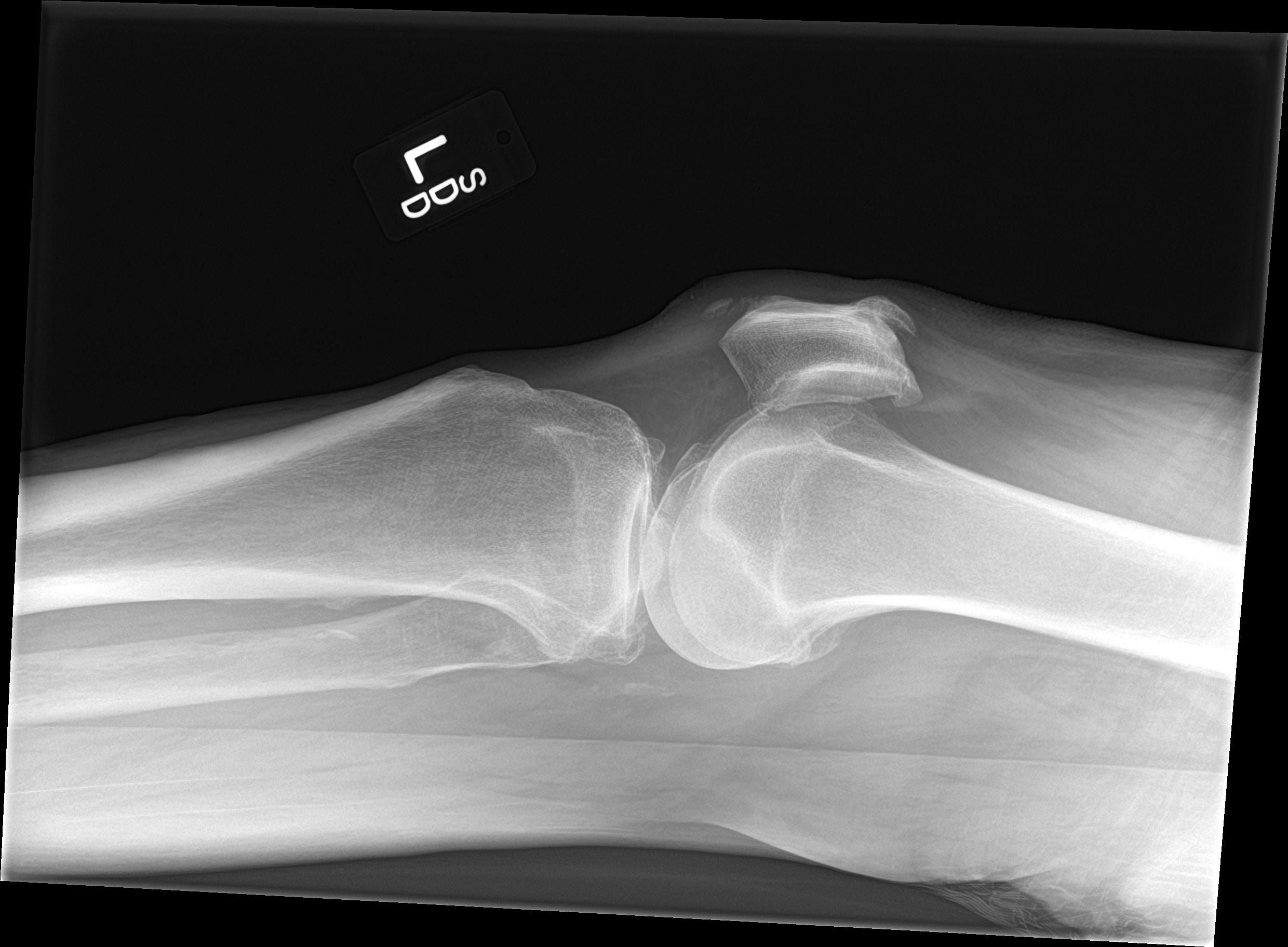

[knee ap (2 of 3)]
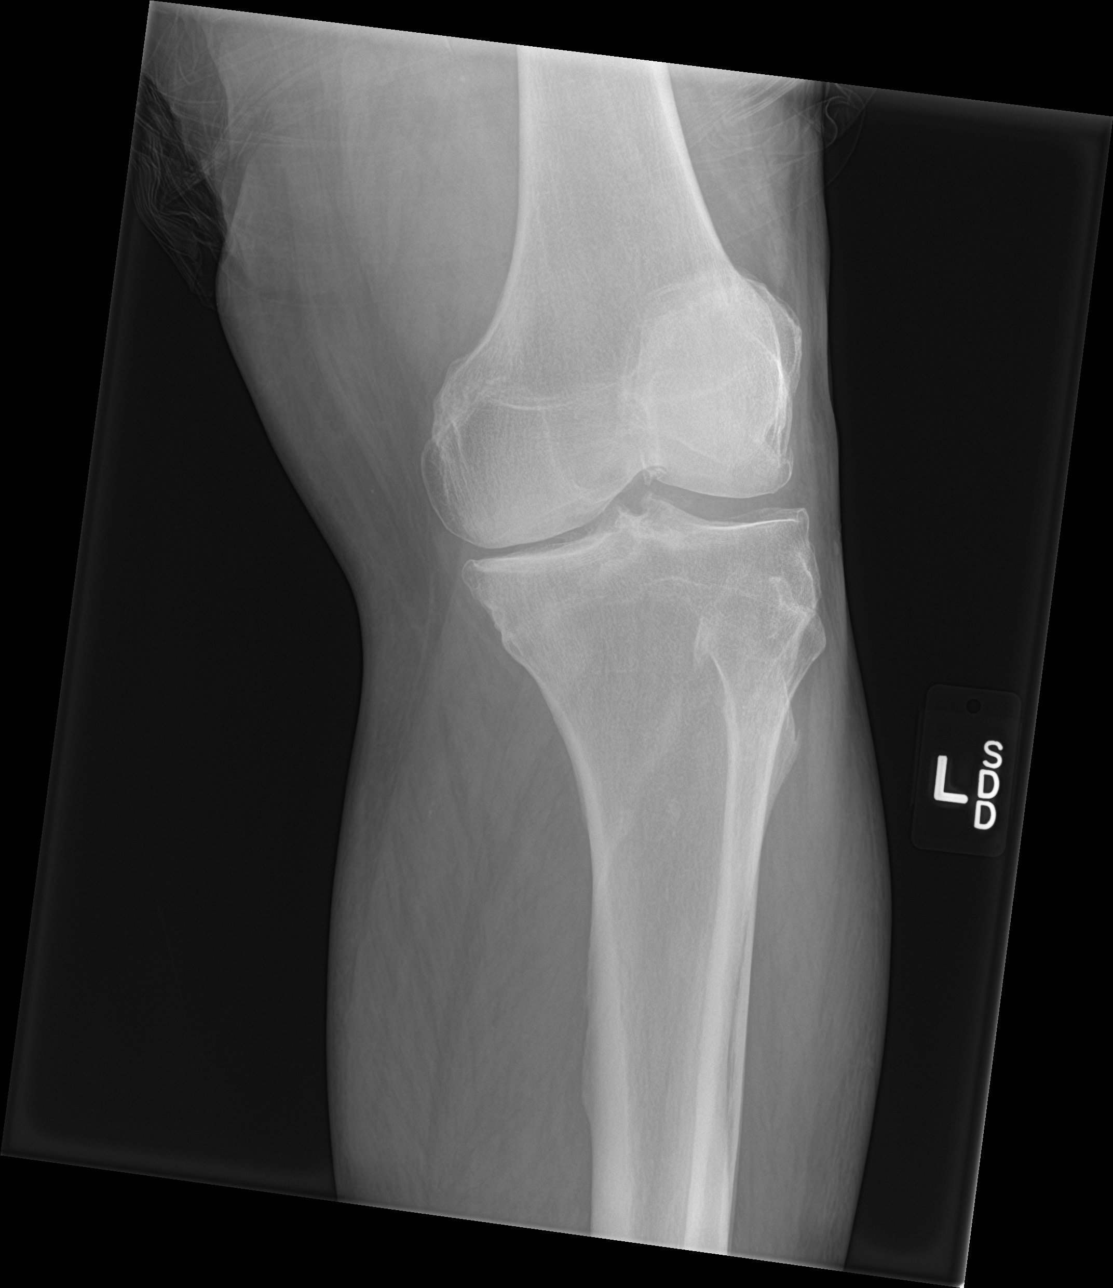

[knee ap (3 of 3)]
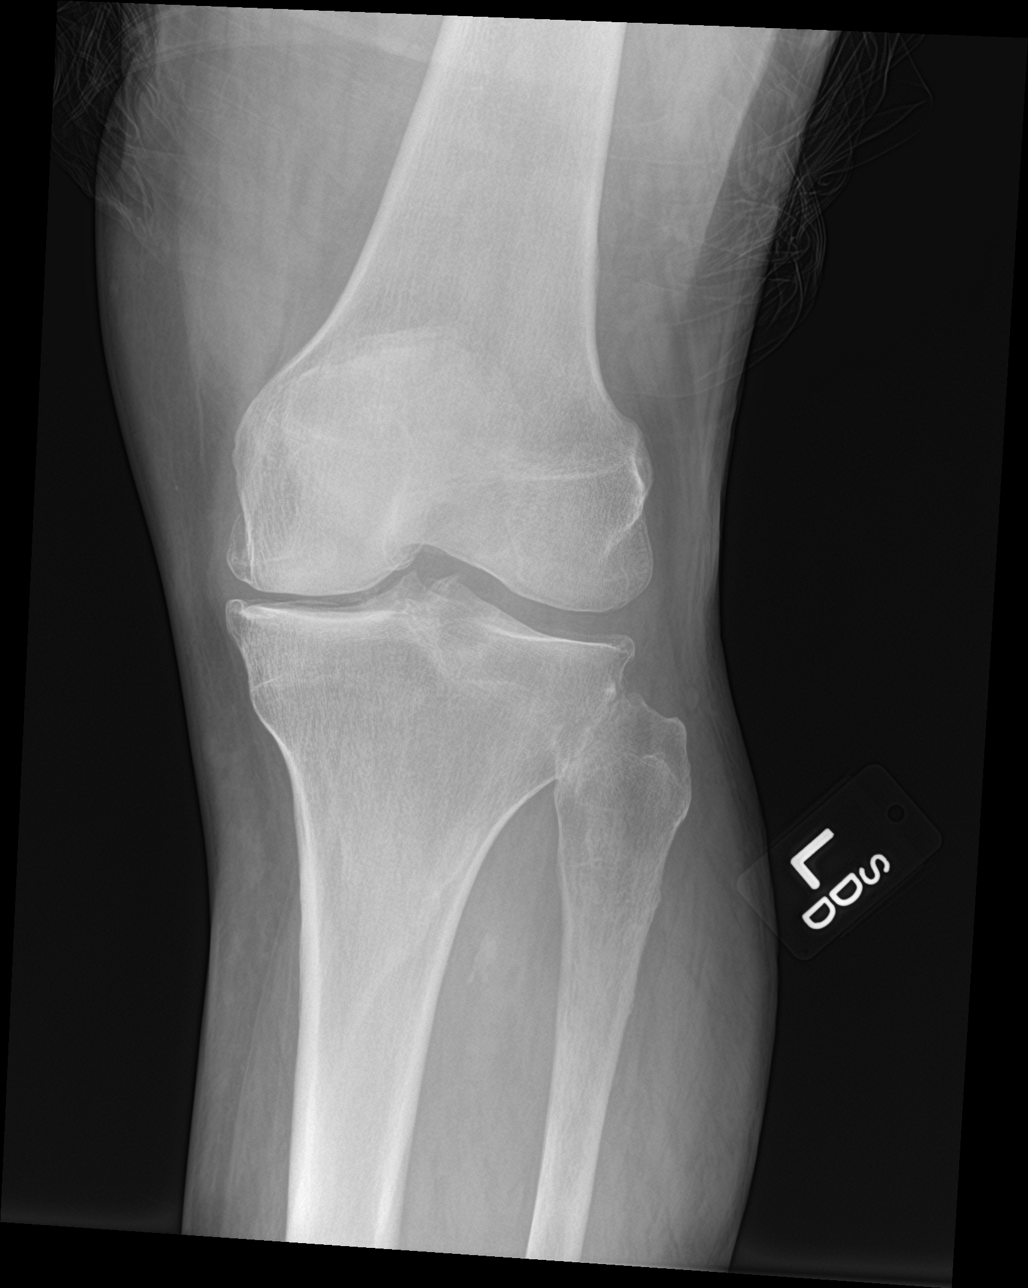

[4 of 4 positions shown; findings below may reference images not displayed]

FINDINGS: Degenerative changes in the left knee with medial greater than
lateral compartment narrowing and tricompartment osteophyte
formation. No significant effusion. No evidence of acute fracture or
dislocation. Vascular calcifications.
IMPRESSION: Mild tricompartment degenerative changes in the left knee. No acute
bony abnormalities.

## 2016-08-23 IMAGING — CT CT CERVICAL SPINE W/O CM
3 of 6 series · 9 of 33 positions shown, 11 images · non-contrast
Comparison: 08/24/2012.

CLINICAL DATA: Trip and fall tonight. No loss of consciousness.
Pain to the forehead.

EXAM:
CT HEAD WITHOUT CONTRAST
CT CERVICAL SPINE WITHOUT CONTRAST
TECHNIQUE: Multidetector CT imaging of the head and cervical spine was
performed following the standard protocol without intravenous
contrast. Multiplanar CT image reconstructions of the cervical spine
were also generated.

[Series 10: sagittal bone 2.0 · sagittal · 0.33mm/px · 5 of 66 slices shown, 6 images]
[im 22/66  bone]
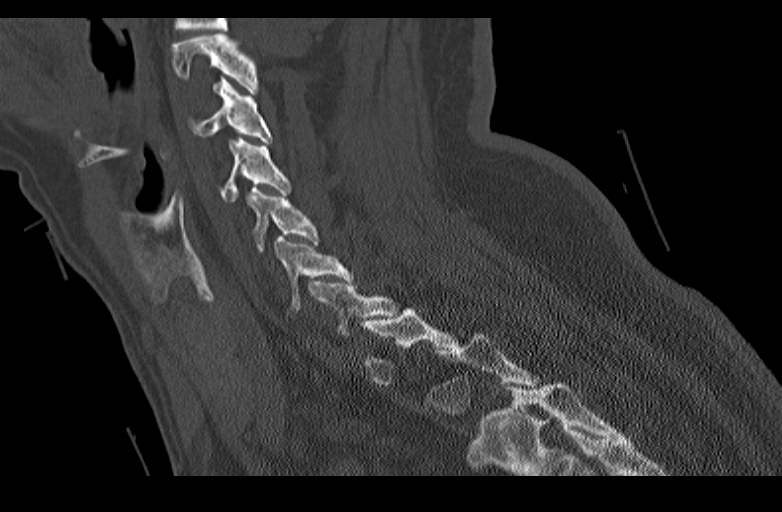
[im 28/66  bone]
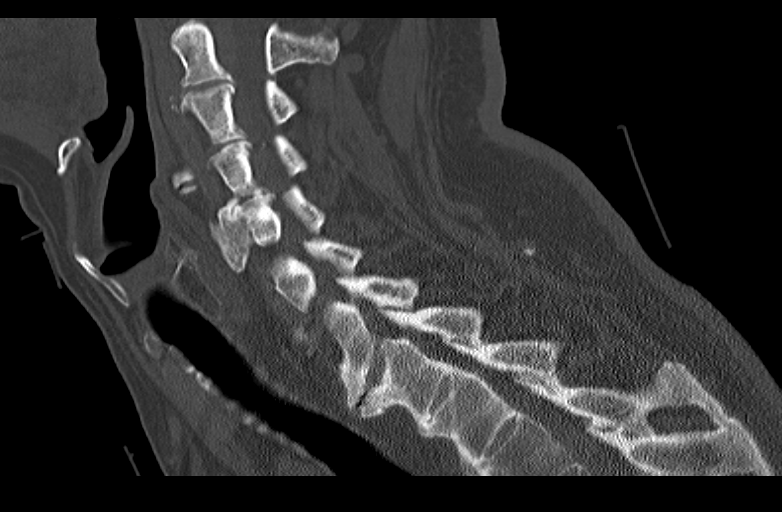
[im 33/66  soft-tissue]
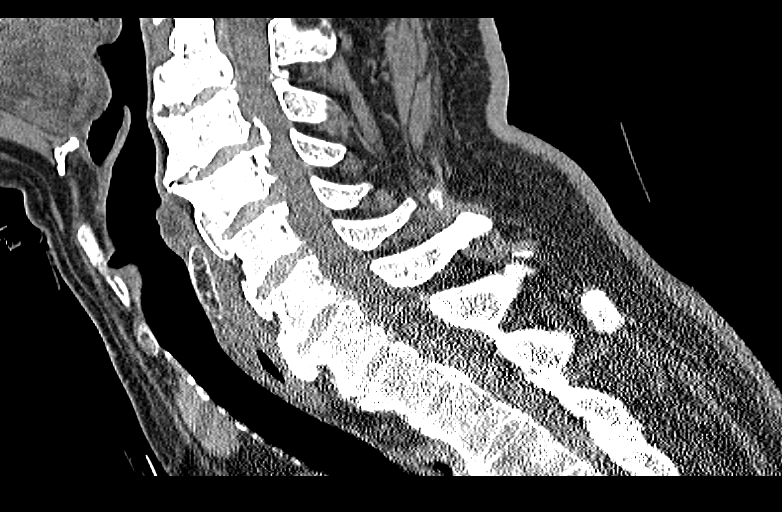
[im 33/66  bone]
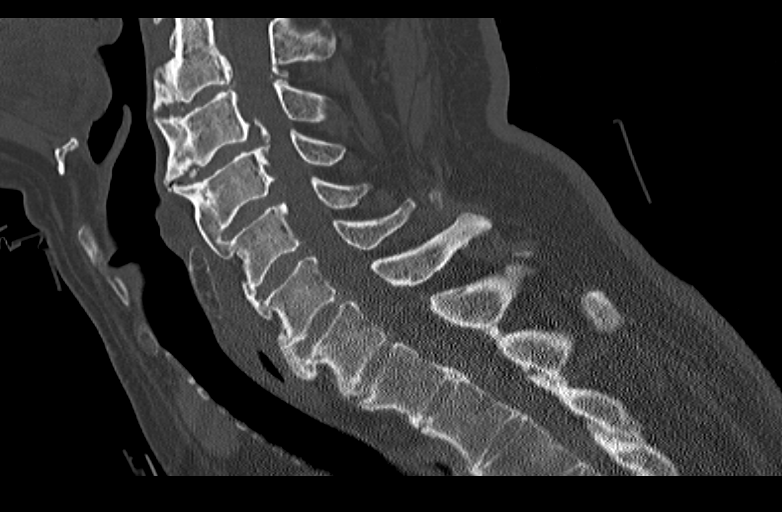
[im 38/66  bone]
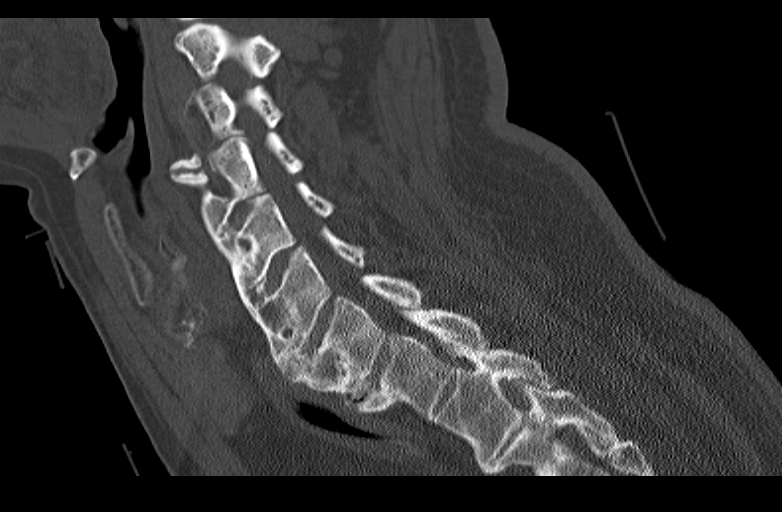
[im 44/66  bone]
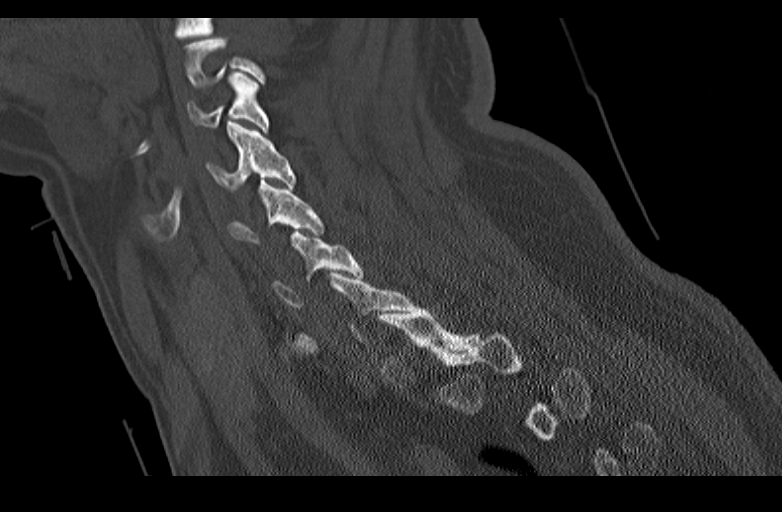

[Series 11: coronal bone 2.0 · coronal · 0.26mm/px · 3 of 106 slices shown]
[im 31/106  bone]
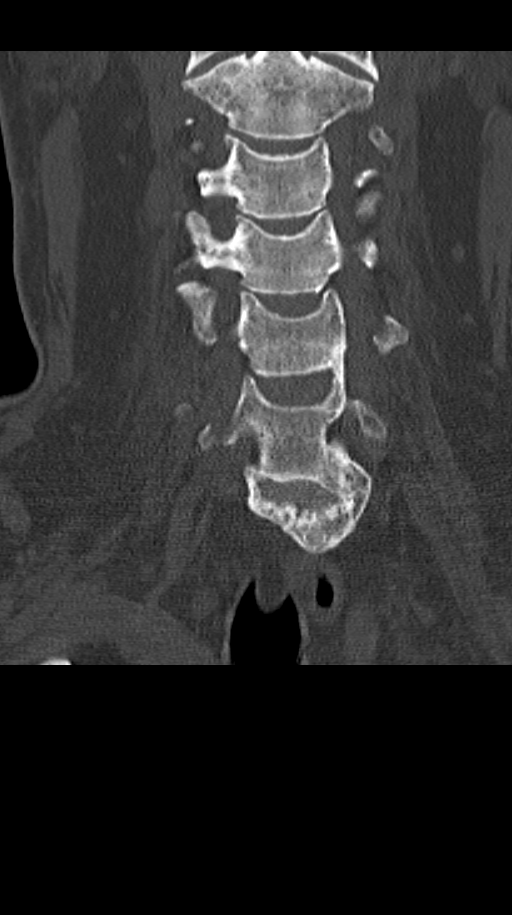
[im 46/106  bone]
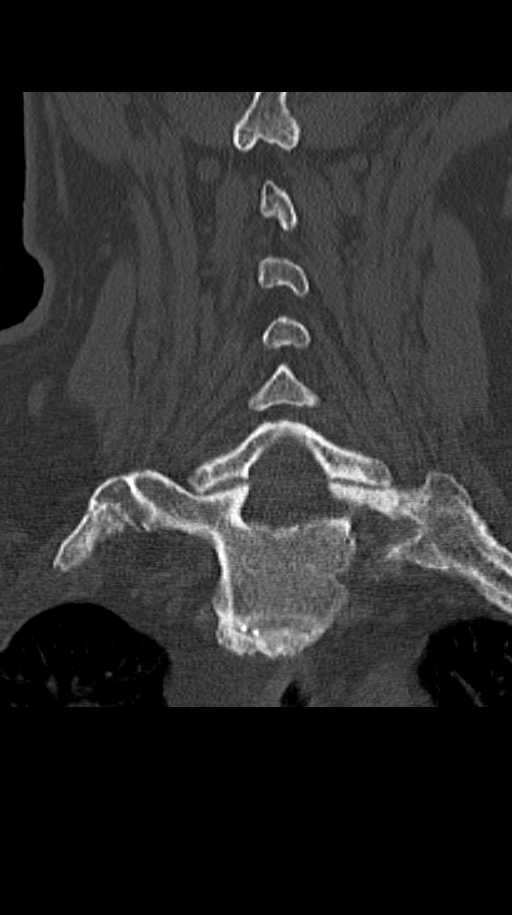
[im 60/106  bone]
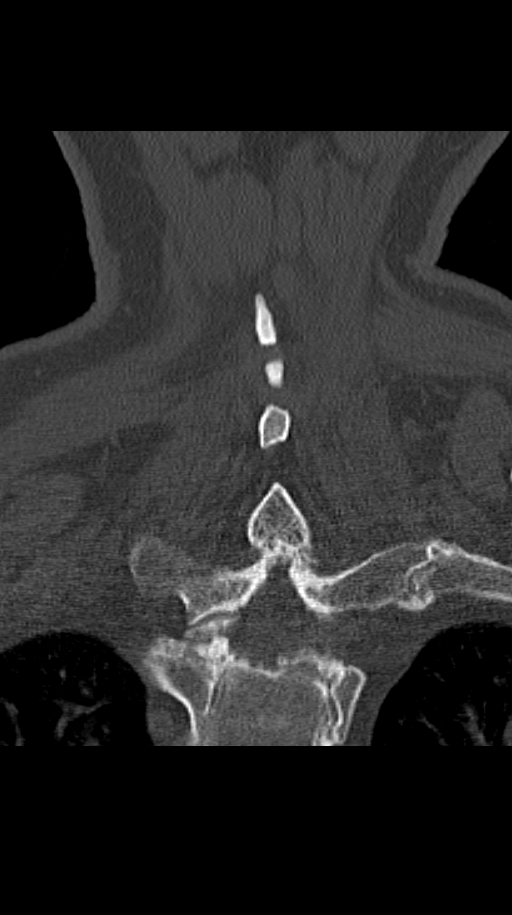

[Series 12: axial bone 2.0 · axial · 0.24mm/px · z∈[+876,+876]mm · 1 of 106 slices shown, 2 images]
[im 53/106  soft-tissue]
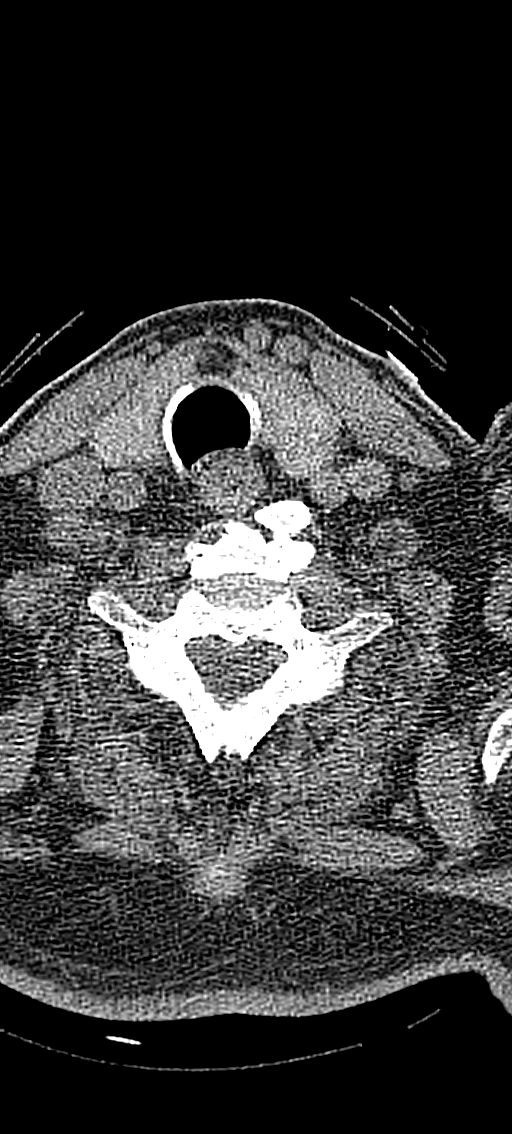
[im 53/106  bone]
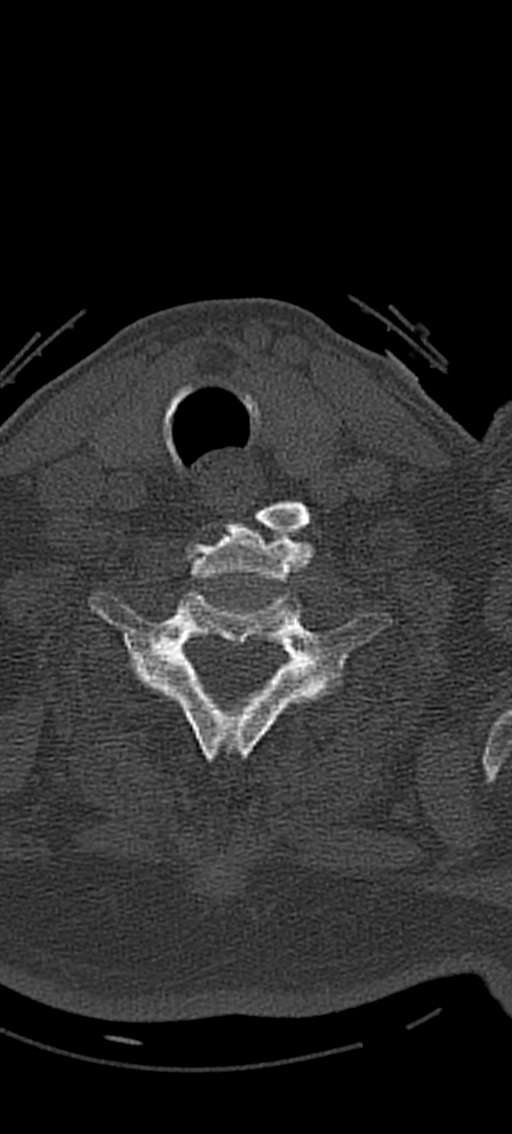

[9 of 33 positions shown; findings below may reference images not displayed]

FINDINGS: CT HEAD FINDINGS

Mild diffuse cerebral atrophy. No ventricular dilatation. No mass
effect or midline shift. No abnormal extra-axial fluid collections.
Gray-white matter junctions are distinct. Basal cisterns are not
effaced. No evidence of acute intracranial hemorrhage. No depressed
skull fractures. Visualized paranasal sinuses and mastoid air cells
are not opacified. Vascular calcifications. Motion artifact.

CT CERVICAL SPINE FINDINGS

Normal alignment of the cervical spine. Diffuse degenerative changes
throughout the cervical spine with narrowed interspaces and
prominent osteophytic changes on the endplates. There ear bridging
osteophytes from C2 through C7 anteriorly. Prominent disc osteophyte
complexes at multiple levels may result in some degree of central
canal stenosis. This is most prominent at C2-3, C3-4, and C4-5
levels. Degenerative changes throughout the facet joints. No
vertebral compression deformities. No prevertebral soft tissue
swelling. No focal bone lesion or bone destruction. Vascular
calcifications.
IMPRESSION: No acute intracranial abnormalities.  Mild atrophy.

Normal alignment of the cervical spine. Diffuse degenerative
changes. No acute displaced fractures identified

## 2016-08-27 NOTE — Telephone Encounter (Signed)
Called [employer].   Left message with Olegario Messier to have [name] call me back.

## 2016-08-27 NOTE — Telephone Encounter (Signed)
[name] called back about this. She needs some more information and asked that you give her a call back. Thanks.

## 2016-08-27 NOTE — Telephone Encounter (Signed)
Spoke to Cody Hale with [employer] and she needs to know a little more detail about the foot deformity; ie: where on each foot, example calluses or other types of deformity.

## 2016-09-02 DIAGNOSIS — B351 Tinea unguium: Secondary | ICD-10-CM | POA: Diagnosis not present

## 2016-09-02 DIAGNOSIS — M79675 Pain in left toe(s): Secondary | ICD-10-CM | POA: Diagnosis not present

## 2016-09-02 DIAGNOSIS — M79674 Pain in right toe(s): Secondary | ICD-10-CM | POA: Diagnosis not present

## 2016-09-03 ENCOUNTER — Ambulatory Visit (INDEPENDENT_AMBULATORY_CARE_PROVIDER_SITE_OTHER): Payer: Medicare Other | Admitting: Orthopedic Surgery

## 2016-09-03 ENCOUNTER — Encounter: Payer: Self-pay | Admitting: Orthopedic Surgery

## 2016-09-03 ENCOUNTER — Encounter: Payer: Self-pay | Admitting: *Deleted

## 2016-09-03 DIAGNOSIS — M25561 Pain in right knee: Secondary | ICD-10-CM | POA: Diagnosis not present

## 2016-09-03 DIAGNOSIS — M1711 Unilateral primary osteoarthritis, right knee: Secondary | ICD-10-CM

## 2016-09-03 DIAGNOSIS — G8929 Other chronic pain: Secondary | ICD-10-CM

## 2016-09-03 NOTE — Progress Notes (Signed)
FOLLOW UP VISIT   Patient ID: Cody Hale, male   DOB: May 05, 1951, 66 y.o.   MRN: [id number]  Chief Complaint  Patient presents with  . Follow-up    chronic right knee pain    HPI Cody Hale is a 66 y.o. male.   HPI  66 year old male with chronic right knee pain considering total knee. Has a history of atrial fibrillation on Xarelto, recently evaluated by vein and vascular for chronic venous stasis. Also diabetic also lives at a nursing home at [facility]. He's worked on weight loss and done well gotten his weight down to 283 with a BMI of 41  Review of Systems Review of Systems   Currently no chest pain or shortness of breath   Past Medical History:  Diagnosis Date  . Anxiety   . Cellulitis   . Chronic systolic heart failure (HCC) 11/2012  . Chronic venous insufficiency   . Coronary atherosclerosis of native coronary artery    Mild nonobstructive 08/2012  . Degenerative joint disease   . Diabetes mellitus, type II (HCC)    Gastroparesis; GI care at [facility]  . 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 s/p zyvox rx.  (Cultures never positive); left bundle branch block; H/o SVT; 09/2012: bioprosthetic MVR at [facility]; a. Severe dental caries and cavities s/p multiple extractions.  . Essential hypertension, benign   . 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 Left bundle branch block   . Left leg cellulitis   . Major depressive disorder, recurrent severe without psychotic features (HCC)    BH admission 12/2012  . Morbid obesity (HCC) 06/27/2012  . Nephrolithiasis   . PSVT (paroxysmal supraventricular tachycardia) (HCC)    Post-op at [facility]  . Urinary tract infection 08/30/2012   Proteus mirabilis    Physical Exam  Wt 283 lb (128.4 kg)   BMI 41.79 kg/m   MEDICAL DECISION MAKING  DATA   DIAGNOSIS  Encounter Diagnoses  Name Primary?  . Morbid obesity due to excess calories (HCC) Yes  . Chronic pain of right knee   . Primary  osteoarthritis of right knee      PLAN(RISK)   He is gotten his weight down from a BMI of 46-41.7. He is too high risk to have surgery done at the local hospital. I would like him evaluated to make sure the cardiology signed off on his surgery addresses his Xarelto and then have him seen by one of the physicians at [facility] to see if they would consider doing his knee replacement at [facility]

## 2016-09-03 NOTE — Telephone Encounter (Signed)
Spoke to pt and he stated that he did not have any calluses, ulcers or amputations.   Spoke to [facility] and informed that the pt did not have the above named deformity. Explained that pt has edema due to poor circulation.  Not able to approve pt for the therapeutic shoes.

## 2016-09-03 NOTE — Addendum Note (Signed)
Addended by: Adella Hare B on: 09/03/2016 03:10 PM   Modules accepted: Orders

## 2016-09-06 ENCOUNTER — Other Ambulatory Visit: Payer: Self-pay | Admitting: Internal Medicine

## 2016-09-06 DIAGNOSIS — Z794 Long term (current) use of insulin: Principal | ICD-10-CM

## 2016-09-06 DIAGNOSIS — E118 Type 2 diabetes mellitus with unspecified complications: Secondary | ICD-10-CM

## 2016-09-18 ENCOUNTER — Ambulatory Visit (INDEPENDENT_AMBULATORY_CARE_PROVIDER_SITE_OTHER): Payer: Medicare Other

## 2016-09-18 ENCOUNTER — Ambulatory Visit (INDEPENDENT_AMBULATORY_CARE_PROVIDER_SITE_OTHER): Payer: Medicare Other | Admitting: Orthopaedic Surgery

## 2016-09-18 DIAGNOSIS — M1711 Unilateral primary osteoarthritis, right knee: Secondary | ICD-10-CM | POA: Insufficient documentation

## 2016-09-18 DIAGNOSIS — G8929 Other chronic pain: Secondary | ICD-10-CM

## 2016-09-18 DIAGNOSIS — M79661 Pain in right lower leg: Secondary | ICD-10-CM | POA: Insufficient documentation

## 2016-09-18 DIAGNOSIS — M25561 Pain in right knee: Secondary | ICD-10-CM

## 2016-09-18 MED ORDER — METHYLPREDNISOLONE ACETATE 40 MG/ML IJ SUSP
40.0000 mg | INTRAMUSCULAR | Status: AC | PRN
  Administered 2016-09-18: 40 mg via INTRA_ARTICULAR
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
## 2016-09-26 ENCOUNTER — Telehealth: Payer: Self-pay | Admitting: Orthopedic Surgery

## 2016-09-26 NOTE — Telephone Encounter (Signed)
ADVISED PATIENT WAS REFERRED TO [facility] TO CALL AND SEE IF THEY REQUIRE A CLEARANCE FORM

## 2016-09-26 NOTE — Telephone Encounter (Signed)
Call received from [place], RN at [facility]; patient is at their office now for appointment.  Letter appears to be in patient's chart requesting clearance.  Asking about a clearance form?  Please advise. Their ph# is [phone] / fax# [phone]

## 2016-09-27 ENCOUNTER — Ambulatory Visit (INDEPENDENT_AMBULATORY_CARE_PROVIDER_SITE_OTHER): Payer: Medicare Other | Admitting: Cardiovascular Disease

## 2016-09-27 ENCOUNTER — Encounter: Payer: Self-pay | Admitting: Cardiovascular Disease

## 2016-09-27 VITALS — BP 120/60 | HR 73 | Ht 69.0 in | Wt 286.0 lb

## 2016-09-27 DIAGNOSIS — I482 Chronic atrial fibrillation, unspecified: Secondary | ICD-10-CM

## 2016-09-27 DIAGNOSIS — Z952 Presence of prosthetic heart valve: Secondary | ICD-10-CM

## 2016-09-27 DIAGNOSIS — I10 Essential (primary) hypertension: Secondary | ICD-10-CM

## 2016-09-27 DIAGNOSIS — R600 Localized edema: Secondary | ICD-10-CM

## 2016-09-27 DIAGNOSIS — E7800 Pure hypercholesterolemia, unspecified: Secondary | ICD-10-CM | POA: Diagnosis not present

## 2016-09-27 DIAGNOSIS — Z01818 Encounter for other preprocedural examination: Secondary | ICD-10-CM

## 2016-09-27 DIAGNOSIS — I5022 Chronic systolic (congestive) heart failure: Secondary | ICD-10-CM | POA: Diagnosis not present

## 2016-09-27 MED ORDER — POTASSIUM CHLORIDE CRYS ER 20 MEQ PO TBCR: 20.0000 meq | EXTENDED_RELEASE_TABLET | Freq: Every day | ORAL | 3 refills | Status: DC
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
## 2016-09-27 NOTE — Patient Instructions (Signed)
Your physician wants you to follow-up in:  1 year Dr Reggy Eye will receive a reminder letter in the mail two months in advance. If you don't receive a letter, please call our office to schedule the follow-up appointment.    INCREASE Potassium to 20 meq daily     If you need a refill on your cardiac medications before your next appointment, please call your pharmacy.     Thank you for choosing [facility] !

## 2016-10-03 ENCOUNTER — Ambulatory Visit (INDEPENDENT_AMBULATORY_CARE_PROVIDER_SITE_OTHER): Payer: Medicare Other | Admitting: Internal Medicine

## 2016-10-03 ENCOUNTER — Encounter: Payer: Self-pay | Admitting: Internal Medicine

## 2016-10-03 VITALS — BP 122/60 | HR 69 | Temp 98.1°F | Ht 69.0 in | Wt 283.5 lb

## 2016-10-03 DIAGNOSIS — E118 Type 2 diabetes mellitus with unspecified complications: Secondary | ICD-10-CM | POA: Diagnosis not present

## 2016-10-03 DIAGNOSIS — I10 Essential (primary) hypertension: Secondary | ICD-10-CM

## 2016-10-03 DIAGNOSIS — H00024 Hordeolum internum left upper eyelid: Secondary | ICD-10-CM | POA: Diagnosis not present

## 2016-10-03 DIAGNOSIS — Z794 Long term (current) use of insulin: Secondary | ICD-10-CM | POA: Diagnosis not present

## 2016-10-03 MED ORDER — DOXYCYCLINE MONOHYDRATE 100 MG PO CAPS: 100.0000 mg | ORAL_CAPSULE | Freq: Two times a day (BID) | ORAL | 0 refills | Status: AC

## 2016-10-03 NOTE — Progress Notes (Signed)
Pre visit review using our clinic review tool, if applicable. No additional management support is needed unless otherwise documented below in the visit note. 

## 2016-10-03 NOTE — Progress Notes (Signed)
Subjective:  Patient ID: Cody Hale, male    DOB: 05/17/1951  Age: 66 y.o. MRN: [id number]  CC: Hypertension; Diabetes; and Belepharitis   HPI Cody Hale presents for Complaints of a painful nodule over his left upper eyelid for the last few days. He also complains of chronic knee pain and is considering having knee replacement surgery. He otherwise feels well and is at his baseline and offers no other complaints.  Outpatient Medications Prior to Visit  Medication Sig Dispense Refill  . azelastine (ASTELIN) 0.1 % nasal spray Place 1 spray into both nostrils 2 (two) times daily. Use in each nostril as directed 30 mL 12  . carvedilol (COREG) 6.25 MG tablet TAKE 1 TABLET BY MOUTH TWICE DAILY. 60 tablet 11  . fluticasone (FLONASE) 50 MCG/ACT nasal spray Place 2 sprays into both nostrils daily. 16 g 11  . LANTUS SOLOSTAR 100 UNIT/ML Solostar Pen INJECT 53 UNITS SUBCUTANEOUSLY AT 10pm. 15 mL 2  . metFORMIN (GLUCOPHAGE-XR) 750 MG 24 hr tablet TAKE 2 TABLETS BY MOUTH EACH MORNING WITH BREAKFAST. 60 tablet 5  . Multiple Vitamin (DAILY VITE) TABS TAKE 1 TABLET BY MOUTH ONCE DAILY. 30 each 11  . oxyCODONE (OXY IR/ROXICODONE) 5 MG immediate release tablet Take 1 tablet (5 mg total) by mouth every 6 (six) hours as needed for severe pain. 75 tablet 0  . potassium chloride SA (KLOR-CON M20) 20 MEQ tablet Take 1 tablet (20 mEq total) by mouth daily. 90 tablet 3  . simvastatin (ZOCOR) 20 MG tablet TAKE 1 TABLET BY MOUTH ONCE DAILY. 30 tablet 11  . torsemide (DEMADEX) 20 MG tablet TAKE 2 TABLETS (40mg ) BY MOUTH TWICE DAILY. 120 tablet 6  . valsartan (DIOVAN) 40 MG tablet TAKE 1 TABLET BY MOUTH TWICE DAILY. 60 tablet 11  . VIIBRYD 40 MG TABS TAKE 1 TABLET BY MOUTH ONCE DAILY. 30 tablet 11  . XARELTO 20 MG TABS tablet TAKE 1 TABLET BY MOUTH ONCE DAILY. 30 tablet 5  . nitroGLYCERIN (NITROSTAT) 0.4 MG SL tablet Place 1 tablet (0.4 mg total) under the tongue every 5 (five) minutes x 3 doses as needed for  chest pain. (Patient not taking: Reported on 10/03/2016) 25 tablet 3   No facility-administered medications prior to visit.     ROS Review of Systems  Constitutional: Negative.   HENT: Negative.   Eyes: Positive for pain. Negative for photophobia, discharge, redness, itching and visual disturbance.  Respiratory: Negative for cough, chest tightness, shortness of breath and wheezing.   Cardiovascular: Negative for chest pain, palpitations and leg swelling.  Gastrointestinal: Negative for abdominal pain, constipation, diarrhea, nausea and vomiting.  Endocrine: Negative.  Negative for heat intolerance, polydipsia, polyphagia and polyuria.  Genitourinary: Negative.  Negative for difficulty urinating, dysuria, hematuria and urgency.  Musculoskeletal: Positive for arthralgias. Negative for back pain, myalgias and neck pain.  Skin: Negative.   Allergic/Immunologic: Negative.   Neurological: Negative.  Negative for dizziness.  Hematological: Negative for adenopathy. Does not bruise/bleed easily.  Psychiatric/Behavioral: Negative.     Objective:  BP 122/60 (BP Location: Left Arm, Patient Position: Sitting, Cuff Size: Large)   Pulse 69   Temp 98.1 F (36.7 C) (Oral)   Ht 5\' 9"  (1.753 m)   Wt 283 lb 8 oz (128.6 kg)   SpO2 98%   BMI 41.87 kg/m   BP Readings from Last 3 Encounters:  10/03/16 122/60  09/27/16 120/60  08/07/16 131/66    Wt Readings from Last 3 Encounters:  10/03/16  283 lb 8 oz (128.6 kg)  09/27/16 286 lb (129.7 kg)  09/03/16 283 lb (128.4 kg)    Physical Exam  Constitutional: He is oriented to person, place, and time. No distress.  HENT:  Mouth/Throat: Oropharynx is clear and moist. No oropharyngeal exudate.  Eyes: Conjunctivae are normal. Pupils are equal, round, and reactive to light. Right eye exhibits no chemosis, no discharge, no exudate and no hordeolum. No foreign body present in the right eye. Left eye exhibits hordeolum. Left eye exhibits no chemosis and no  discharge. No foreign body present in the left eye. Right conjunctiva is not injected. Right conjunctiva has no hemorrhage. Left conjunctiva is not injected. Left conjunctiva has no hemorrhage. No scleral icterus.  Left upper eyelid shows a small internal 66 year old Midway between the medial canthus in the middle of the eyelid  Neck: Normal range of motion. Neck supple. No JVD present. No tracheal deviation present. No thyromegaly present.  Cardiovascular: Normal rate, normal heart sounds and intact distal pulses.  Exam reveals no gallop and no friction rub.   No murmur heard. Pulmonary/Chest: Effort normal and breath sounds normal. No stridor. No respiratory distress. He has no wheezes. He has no rales. He exhibits no tenderness.  Abdominal: Soft. Bowel sounds are normal. He exhibits no distension and no mass. There is no tenderness. There is no rebound and no guarding.  Musculoskeletal: Normal range of motion. He exhibits no tenderness or deformity.       Feet:  Lymphadenopathy:    He has no cervical adenopathy.  Neurological: He is oriented to person, place, and time.  Skin: Skin is warm and dry. No rash noted. He is not diaphoretic. No erythema. No pallor.    Lab Results  Component Value Date   WBC 8.5 05/20/2016   HGB 14.5 05/20/2016   HCT 43.7 05/20/2016   PLT 128 (L) 05/20/2016   GLUCOSE 165 (H) 07/11/2016   CHOL 102 03/13/2016   TRIG 108.0 03/13/2016   HDL 46.80 03/13/2016   LDLDIRECT 84.2 01/31/2014   LDLCALC 34 03/13/2016   ALT 30 05/20/2016   AST 25 05/20/2016   NA 144 07/11/2016   K 3.8 07/11/2016   CL 100 07/11/2016   CREATININE 1.00 07/11/2016   BUN 15 07/11/2016   CO2 38 (H) 07/11/2016   TSH 0.943 05/20/2016   PSA 0.36 05/30/2014   INR 1.13 11/30/2012   HGBA1C 8.3 (H) 07/11/2016   MICROALBUR 19.9 (H) 07/11/2016    No results found.  Assessment & Plan:   Cody Hale was seen today for hypertension, diabetes and belepharitis.  Diagnoses and all orders for  this visit:  Hordeolum internum left upper eyelid -     doxycycline (MONODOX) 100 MG capsule; Take 1 capsule (100 mg total) by mouth 2 (two) times daily.  Essential hypertension, malignant- his blood pressure is adequately well controlled  Type 2 diabetes mellitus with complication, with long-term current use of insulin (HCC)- it is too early to repeat his A1c, for now will continue the current regimen for blood sugar control.   I have discontinued Mr. Remer potassium chloride. I am also having him start on doxycycline. Additionally, I am having him maintain his nitroGLYCERIN, fluticasone, azelastine, oxyCODONE, carvedilol, metFORMIN, simvastatin, torsemide, valsartan, VIIBRYD, XARELTO, DAILY VITE, LANTUS SOLOSTAR, and potassium chloride SA.  Meds ordered this encounter  Medications  . DISCONTD: potassium chloride (K-DUR) 10 MEQ tablet  . doxycycline (MONODOX) 100 MG capsule    Sig: Take 1 capsule (100 mg total)  by mouth 2 (two) times daily.    Dispense:  20 capsule    Refill:  0     Follow-up: Return if symptoms worsen or fail to improve.  Sanda Linger, MD

## 2016-10-03 NOTE — Patient Instructions (Signed)
Stye A stye is a bump on your eyelid caused by a bacterial infection. A stye can form inside the eyelid (internal stye) or outside the eyelid (external stye). An internal stye may be caused by an infected oil-producing gland inside your eyelid. An external stye may be caused by an infection at the base of your eyelash (hair follicle). Styes are very common. Anyone can get them at any age. They usually occur in just one eye, but you may have more than one in either eye. What are the causes? The infection is almost always caused by bacteria called Staphylococcus aureus. This is a common type of bacteria that lives on your skin. What increases the risk? You may be at higher risk for a stye if you have had one before. You may also be at higher risk if you have:  Diabetes.  Long-term illness.  Long-term eye redness.  A skin condition called seborrhea.  High fat levels in your blood (lipids). What are the signs or symptoms? Eyelid pain is the most common symptom of a stye. Internal styes are more painful than external styes. Other signs and symptoms may include:  Painful swelling of your eyelid.  A scratchy feeling in your eye.  Tearing and redness of your eye.  Pus draining from the stye. How is this diagnosed? Your health care provider may be able to diagnose a stye just by examining your eye. The health care provider may also check to make sure:  You do not have a fever or other signs of a more serious infection.  The infection has not spread to other parts of your eye or areas around your eye. How is this treated? Most styes will clear up in a few days without treatment. In some cases, you may need to use antibiotic drops or ointment to prevent infection. Your health care provider may have to drain the stye surgically if your stye is:  Large.  Causing a lot of pain.  Interfering with your vision. This can be done using a thin blade or a needle. Follow these instructions at  home:  Take medicines only as directed by your health care provider.  Apply a clean, warm compress to your eye for 10 minutes, 4 times a day.  Do not wear contact lenses or eye makeup until your stye has healed.  Do not try to pop or drain the stye. Contact a health care provider if:  You have chills or a fever.  Your stye does not go away after several days.  Your stye affects your vision.  Your eyeball becomes swollen, red, or painful. This information is not intended to replace advice given to you by your health care provider. Make sure you discuss any questions you have with your health care provider. Document Released: 04/03/2005 Document Revised: 02/18/2016 Document Reviewed: 10/08/2013 Elsevier Interactive Patient Education  2017 [employer]  

## 2016-10-16 ENCOUNTER — Telehealth: Payer: Self-pay | Admitting: Internal Medicine

## 2016-10-16 ENCOUNTER — Other Ambulatory Visit: Payer: Self-pay | Admitting: Internal Medicine

## 2016-10-16 DIAGNOSIS — J301 Allergic rhinitis due to pollen: Secondary | ICD-10-CM

## 2016-10-16 MED ORDER — LEVOCETIRIZINE DIHYDROCHLORIDE 5 MG PO TABS: 5.0000 mg | ORAL_TABLET | Freq: Every evening | ORAL | 3 refills | Status: DC

## 2016-10-16 NOTE — Telephone Encounter (Signed)
Pt has sinus congestion, and it will be 5 days before they can get transportation, would like to know if something can be called in for this.

## 2016-10-16 NOTE — Telephone Encounter (Signed)
RX sent

## 2016-10-16 NOTE — Telephone Encounter (Signed)
Left message for Kennyth Arnold that rx has been sent.

## 2016-10-21 ENCOUNTER — Ambulatory Visit (INDEPENDENT_AMBULATORY_CARE_PROVIDER_SITE_OTHER): Payer: Medicare Other | Admitting: Orthopaedic Surgery

## 2016-10-21 DIAGNOSIS — G8929 Other chronic pain: Secondary | ICD-10-CM

## 2016-10-21 DIAGNOSIS — M25561 Pain in right knee: Secondary | ICD-10-CM

## 2016-10-21 DIAGNOSIS — M1711 Unilateral primary osteoarthritis, right knee: Secondary | ICD-10-CM

## 2016-10-21 NOTE — Progress Notes (Signed)
The patient is here for a supplement/hyaluronic acid injection in his right knee which will be Monovisc. 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 This is been a prearranged visit for a place in his injections knee. I spent some time his pain the risks and benefits of these injections as well. I then prepped the anterior aspect of his right knee with Betadine and alcohol and placed the hyaluronic acid injection without difficulty. He tolerated this well. We'll see him back in 3 months to see how is doing overall.

## 2016-10-24 LAB — HM DIABETES EYE EXAM

## 2016-11-07 ENCOUNTER — Telehealth: Payer: Self-pay | Admitting: Internal Medicine

## 2016-11-07 ENCOUNTER — Other Ambulatory Visit: Payer: Self-pay | Admitting: Internal Medicine

## 2016-11-07 DIAGNOSIS — J301 Allergic rhinitis due to pollen: Secondary | ICD-10-CM

## 2016-11-07 MED ORDER — METHYLPREDNISOLONE 4 MG PO TBPK: ORAL_TABLET | ORAL | 0 refills | Status: DC

## 2016-11-07 NOTE — Telephone Encounter (Signed)
Cody Hale called and stated the patient was having some sx --Sinus pressure/drainage/ Swollen eyes, stopped up nose. They would like to know If something could be called in. Please advise. Thank you.   [phone] Cody Hale @ [facility]  [phone] - RX care

## 2016-11-08 DIAGNOSIS — E119 Type 2 diabetes mellitus without complications: Secondary | ICD-10-CM | POA: Diagnosis not present

## 2016-11-08 DIAGNOSIS — Z7984 Long term (current) use of oral hypoglycemic drugs: Secondary | ICD-10-CM | POA: Diagnosis not present

## 2016-11-08 DIAGNOSIS — Z794 Long term (current) use of insulin: Secondary | ICD-10-CM | POA: Diagnosis not present

## 2016-11-08 DIAGNOSIS — H25813 Combined forms of age-related cataract, bilateral: Secondary | ICD-10-CM | POA: Diagnosis not present

## 2016-11-11 NOTE — Telephone Encounter (Signed)
erx sent on 11/07/2016

## 2016-11-18 ENCOUNTER — Other Ambulatory Visit: Payer: Self-pay | Admitting: Internal Medicine

## 2016-11-18 DIAGNOSIS — E118 Type 2 diabetes mellitus with unspecified complications: Secondary | ICD-10-CM

## 2016-11-18 DIAGNOSIS — Z794 Long term (current) use of insulin: Principal | ICD-10-CM

## 2016-11-19 NOTE — Telephone Encounter (Signed)
Insulin was increase to 55 units on 11/04/2016. Document sent to scan (10/31/2016 date the form originated).

## 2016-12-13 ENCOUNTER — Telehealth: Payer: Self-pay | Admitting: Internal Medicine

## 2016-12-13 NOTE — Telephone Encounter (Signed)
Pt is not  currently having ant allergy symptoms and would like to DC the azelastine (ASTELIN) 0.1 % nasal spray to save money

## 2016-12-16 NOTE — Telephone Encounter (Signed)
Notified pharmacist Ayesha Rumpf) back w/MD response...Raechel Chute

## 2016-12-16 NOTE — Telephone Encounter (Signed)
OK 

## 2016-12-31 ENCOUNTER — Ambulatory Visit (INDEPENDENT_AMBULATORY_CARE_PROVIDER_SITE_OTHER): Payer: Medicare Other | Admitting: Internal Medicine

## 2016-12-31 ENCOUNTER — Telehealth: Payer: Self-pay | Admitting: Internal Medicine

## 2016-12-31 ENCOUNTER — Other Ambulatory Visit (INDEPENDENT_AMBULATORY_CARE_PROVIDER_SITE_OTHER): Payer: Medicare Other

## 2016-12-31 ENCOUNTER — Encounter: Payer: Self-pay | Admitting: Internal Medicine

## 2016-12-31 VITALS — BP 152/72 | HR 111 | Temp 98.6°F | Resp 16 | Ht 69.0 in | Wt 297.5 lb

## 2016-12-31 DIAGNOSIS — M159 Polyosteoarthritis, unspecified: Secondary | ICD-10-CM

## 2016-12-31 DIAGNOSIS — E118 Type 2 diabetes mellitus with unspecified complications: Secondary | ICD-10-CM

## 2016-12-31 DIAGNOSIS — D510 Vitamin B12 deficiency anemia due to intrinsic factor deficiency: Secondary | ICD-10-CM

## 2016-12-31 DIAGNOSIS — M8949 Other hypertrophic osteoarthropathy, multiple sites: Secondary | ICD-10-CM

## 2016-12-31 DIAGNOSIS — I10 Essential (primary) hypertension: Secondary | ICD-10-CM

## 2016-12-31 DIAGNOSIS — M150 Primary generalized (osteo)arthritis: Secondary | ICD-10-CM

## 2016-12-31 DIAGNOSIS — Z794 Long term (current) use of insulin: Secondary | ICD-10-CM

## 2016-12-31 DIAGNOSIS — I5022 Chronic systolic (congestive) heart failure: Secondary | ICD-10-CM

## 2016-12-31 DIAGNOSIS — R600 Localized edema: Secondary | ICD-10-CM | POA: Diagnosis not present

## 2016-12-31 DIAGNOSIS — R197 Diarrhea, unspecified: Secondary | ICD-10-CM

## 2016-12-31 LAB — URINALYSIS, ROUTINE W REFLEX MICROSCOPIC
Bilirubin Urine: NEGATIVE
Hgb urine dipstick: NEGATIVE
Ketones, ur: NEGATIVE
Nitrite: NEGATIVE
RBC / HPF: NONE SEEN (ref 0–?)
Specific Gravity, Urine: 1.01 (ref 1.000–1.030)
Total Protein, Urine: NEGATIVE
Urine Glucose: NEGATIVE
Urobilinogen, UA: 0.2 (ref 0.0–1.0)
pH: 6 (ref 5.0–8.0)

## 2016-12-31 LAB — CBC WITH DIFFERENTIAL/PLATELET
Basophils Absolute: 0.1 10*3/uL (ref 0.0–0.1)
Basophils Relative: 0.8 % (ref 0.0–3.0)
Eosinophils Absolute: 0.1 10*3/uL (ref 0.0–0.7)
Eosinophils Relative: 2 % (ref 0.0–5.0)
HCT: 39.4 % (ref 39.0–52.0)
Hemoglobin: 13 g/dL (ref 13.0–17.0)
Lymphocytes Relative: 12.4 % (ref 12.0–46.0)
Lymphs Abs: 0.9 10*3/uL (ref 0.7–4.0)
MCHC: 32.9 g/dL (ref 30.0–36.0)
MCV: 94.2 fl (ref 78.0–100.0)
Monocytes Absolute: 0.7 10*3/uL (ref 0.1–1.0)
Monocytes Relative: 10.3 % (ref 3.0–12.0)
Neutro Abs: 5.3 10*3/uL (ref 1.4–7.7)
Neutrophils Relative %: 74.5 % (ref 43.0–77.0)
Platelets: 142 10*3/uL — ABNORMAL LOW (ref 150.0–400.0)
RBC: 4.18 Mil/uL — ABNORMAL LOW (ref 4.22–5.81)
RDW: 14.4 % (ref 11.5–15.5)
WBC: 7.1 10*3/uL (ref 4.0–10.5)

## 2016-12-31 LAB — THYROID PANEL WITH TSH
Free Thyroxine Index: 2.6 (ref 1.4–3.8)
T3 Uptake: 30 % (ref 22–35)
T4, Total: 8.6 ug/dL (ref 4.5–12.0)
TSH: 0.8 mIU/L (ref 0.40–4.50)

## 2016-12-31 LAB — COMPREHENSIVE METABOLIC PANEL
ALT: 28 U/L (ref 0–53)
AST: 22 U/L (ref 0–37)
Albumin: 4.2 g/dL (ref 3.5–5.2)
Alkaline Phosphatase: 91 U/L (ref 39–117)
BUN: 15 mg/dL (ref 6–23)
CO2: 35 mEq/L — ABNORMAL HIGH (ref 19–32)
Calcium: 9.3 mg/dL (ref 8.4–10.5)
Chloride: 100 mEq/L (ref 96–112)
Creatinine, Ser: 1.12 mg/dL (ref 0.40–1.50)
GFR: 69.68 mL/min (ref >60.00)
Glucose, Bld: 210 mg/dL — ABNORMAL HIGH (ref 70–99)
Potassium: 4 mEq/L (ref 3.5–5.1)
Sodium: 141 mEq/L (ref 135–145)
Total Bilirubin: 0.5 mg/dL (ref 0.2–1.2)
Total Protein: 6.8 g/dL (ref 6.0–8.3)

## 2016-12-31 LAB — BRAIN NATRIURETIC PEPTIDE: Pro B Natriuretic peptide (BNP): 87 pg/mL (ref 0.0–100.0)

## 2016-12-31 LAB — POCT GLYCOSYLATED HEMOGLOBIN (HGB A1C): Hemoglobin A1C: 7.3

## 2016-12-31 MED ORDER — METOLAZONE 2.5 MG PO TABS: 2.5000 mg | ORAL_TABLET | ORAL | 1 refills | Status: DC

## 2016-12-31 MED ORDER — OXYCODONE HCL 5 MG PO TABS: 5.0000 mg | ORAL_TABLET | Freq: Four times a day (QID) | ORAL | 0 refills | Status: DC | PRN

## 2016-12-31 NOTE — Patient Instructions (Signed)

## 2016-12-31 NOTE — Progress Notes (Signed)
Subjective:  Patient ID: Cody Hale, male    DOB: [birth year]  Age: 66 y.o. MRN: [id number]  CC: Osteoarthritis; Diabetes; and Hypertension   HPI Cody Hale presents for concerns about diarrhea for 3 weeks. He has about 2 watery bowel movements a day with rare cramping. He has not noticed any blood or mucus in his stool. He has not taken anything to control the symptoms. He denies loss of appetite, nausea, vomiting, fever, chills, or weight loss. He also complains of worsening edema in his lower extremities over the last month or 2 with weight gain. He has been compliant with the loop diuretic and low-sodium intake. He's had no episodes of chest pain, shortness of breath, palpitations, or fatigue.  Outpatient Medications Prior to Visit  Medication Sig Dispense Refill  . azelastine (ASTELIN) 0.1 % nasal spray Place 1 spray into both nostrils 2 (two) times daily. Use in each nostril as directed 30 mL 12  . carvedilol (COREG) 6.25 MG tablet TAKE 1 TABLET BY MOUTH TWICE DAILY. 60 tablet 11  . fluticasone (FLONASE) 50 MCG/ACT nasal spray Place 2 sprays into both nostrils daily. 16 g 11  . Insulin Glargine (LANTUS SOLOSTAR) 100 UNIT/ML Solostar Pen Inject 55 Units into the skin daily at 10 pm. 15 mL 3  . levocetirizine (XYZAL) 5 MG tablet Take 1 tablet (5 mg total) by mouth every evening. 90 tablet 3  . metFORMIN (GLUCOPHAGE-XR) 750 MG 24 hr tablet TAKE 2 TABLETS BY MOUTH EACH MORNING WITH BREAKFAST. 60 tablet 5  . Multiple Vitamin (DAILY VITE) TABS TAKE 1 TABLET BY MOUTH ONCE DAILY. 30 each 11  . simvastatin (ZOCOR) 20 MG tablet TAKE 1 TABLET BY MOUTH ONCE DAILY. 30 tablet 11  . torsemide (DEMADEX) 20 MG tablet TAKE 2 TABLETS (40mg ) BY MOUTH TWICE DAILY. 120 tablet 6  . valsartan (DIOVAN) 40 MG tablet TAKE 1 TABLET BY MOUTH TWICE DAILY. 60 tablet 11  . VIIBRYD 40 MG TABS TAKE 1 TABLET BY MOUTH ONCE DAILY. 30 tablet 11  . XARELTO 20 MG TABS tablet TAKE 1 TABLET BY MOUTH ONCE DAILY. 30 tablet  5  . methylPREDNISolone (MEDROL DOSEPAK) 4 MG TBPK tablet TAKE AS DIRECTED 21 tablet 0  . oxyCODONE (OXY IR/ROXICODONE) 5 MG immediate release tablet Take 1 tablet (5 mg total) by mouth every 6 (six) hours as needed for severe pain. 75 tablet 0  . nitroGLYCERIN (NITROSTAT) 0.4 MG SL tablet Place 1 tablet (0.4 mg total) under the tongue every 5 (five) minutes x 3 doses as needed for chest pain. (Patient not taking: Reported on 12/31/2016) 25 tablet 3  . potassium chloride SA (KLOR-CON M20) 20 MEQ tablet Take 1 tablet (20 mEq total) by mouth daily. 90 tablet 3   No facility-administered medications prior to visit.     ROS Review of Systems  Constitutional: Positive for unexpected weight change. Negative for activity change, appetite change, diaphoresis and fatigue.  HENT: Negative.   Eyes: Negative for visual disturbance.  Respiratory: Negative for cough, chest tightness, shortness of breath and wheezing.   Cardiovascular: Positive for leg swelling. Negative for chest pain and palpitations.  Gastrointestinal: Positive for diarrhea. Negative for abdominal distention, abdominal pain, anal bleeding, blood in stool, nausea, rectal pain and vomiting.  Endocrine: Negative.  Negative for polydipsia, polyphagia and polyuria.  Genitourinary: Negative.  Negative for difficulty urinating, dysuria, hematuria and urgency.  Musculoskeletal: Positive for arthralgias. Negative for back pain, myalgias and neck pain.  Skin: Negative.  Negative  for pallor and rash.  Allergic/Immunologic: Negative.   Neurological: Negative.   Hematological: Negative for adenopathy. Does not bruise/bleed easily.  Psychiatric/Behavioral: Negative.     Objective:  BP (!) 152/72 (BP Location: Left Arm, Patient Position: Sitting, Cuff Size: Large)   Pulse (!) 111   Temp 98.6 F (37 C) (Oral)   Resp 16   Ht 5\' 9"  (1.753 m)   Wt 297 lb 8 oz (134.9 kg)   SpO2 99%   BMI 43.93 kg/m   BP Readings from Last 3 Encounters:    12/31/16 (!) 152/72  10/03/16 122/60  09/27/16 120/60    Wt Readings from Last 3 Encounters:  12/31/16 297 lb 8 oz (134.9 kg)  10/03/16 283 lb 8 oz (128.6 kg)  09/27/16 286 lb (129.7 kg)    Physical Exam  Constitutional: He is oriented to person, place, and time. No distress.  HENT:  Mouth/Throat: Oropharynx is clear and moist. No oropharyngeal exudate.  Eyes: Conjunctivae are normal. Right eye exhibits no discharge. Left eye exhibits no discharge. No scleral icterus.  Neck: Normal range of motion. No JVD present. No thyromegaly present.  Cardiovascular: Normal rate, regular rhythm and normal heart sounds.  Exam reveals no gallop.   No murmur heard. Pulmonary/Chest: Effort normal and breath sounds normal. No respiratory distress. He has no wheezes. He has no rales. He exhibits no tenderness.  Abdominal: Soft. Bowel sounds are normal. He exhibits no distension and no mass. There is no tenderness. There is no rebound and no guarding.  Musculoskeletal: Normal range of motion. He exhibits edema (2+ pitting edema in BLE). He exhibits no tenderness or deformity.  Lymphadenopathy:    He has no cervical adenopathy.  Neurological: He is alert and oriented to person, place, and time.  Skin: Skin is warm and dry. No rash noted. He is not diaphoretic. No erythema. No pallor.  Vitals reviewed.   Lab Results  Component Value Date   WBC 7.1 12/31/2016   HGB 13.0 12/31/2016   HCT 39.4 12/31/2016   PLT 142.0 (L) 12/31/2016   GLUCOSE 210 (H) 12/31/2016   CHOL 102 03/13/2016   TRIG 108.0 03/13/2016   HDL 46.80 03/13/2016   LDLDIRECT 84.2 01/31/2014   LDLCALC 34 03/13/2016   ALT 28 12/31/2016   AST 22 12/31/2016   NA 141 12/31/2016   K 4.0 12/31/2016   CL 100 12/31/2016   CREATININE 1.12 12/31/2016   BUN 15 12/31/2016   CO2 35 (H) 12/31/2016   TSH 0.80 12/31/2016   PSA 0.36 05/30/2014   INR 1.13 11/30/2012   HGBA1C 7.3 12/31/2016   MICROALBUR 19.9 (H) 07/11/2016    No results  found.  Assessment & Plan:   Cody Hale was seen today for osteoarthritis, diabetes and hypertension.  Diagnoses and all orders for this visit:  Type 2 diabetes mellitus with complication, with long-term current use of insulin (HCC)- his A1c is 7.3%, his blood sugars are adequately well controlled. -     POCT glycosylated hemoglobin (Hb A1C) -     Comprehensive metabolic panel; Future  Chronic systolic heart failure (HCC)- He has lower extremity edema but a normal BNP, thick he is starting to develop fluid overload so we'll add metolazone to the loop diuretic. -     Discontinue: metolazone (ZAROXOLYN) 2.5 MG tablet; Take 1 tablet (2.5 mg total) by mouth every other day. -     Brain natriuretic peptide; Future -     metolazone (ZAROXOLYN) 2.5 MG tablet; Take 1  tablet (2.5 mg total) by mouth every other day.  Morbid obesity (HCC)- he agrees to improve his lifestyle modifications to lose weight.  Localized edema- his labs are negative for any secondary metabolic causes, his BNP is normal so I think the edema is dependent in the lower extremities and to treat this will add metolazone to the loop diuretic. -     Discontinue: metolazone (ZAROXOLYN) 2.5 MG tablet; Take 1 tablet (2.5 mg total) by mouth every other day. -     Brain natriuretic peptide; Future -     metolazone (ZAROXOLYN) 2.5 MG tablet; Take 1 tablet (2.5 mg total) by mouth every other day.  Essential hypertension, malignant- his blood pressure is not adequately well controlled, will add another diuretic. -     Comprehensive metabolic panel; Future -     Urinalysis, Routine w reflex microscopic; Future -     Thyroid Panel With TSH; Future  Vitamin B12 deficiency anemia due to intrinsic factor deficiency -     CBC with Differential/Platelet; Future  Diarrhea, unspecified type- his white cell count is normal, he has no evidence of dehydration or complications, will screen his stool for enteric pathogens. He was encouraged to use  Imodium A-D as needed for the diarrhea. -     CBC with Differential/Platelet; Future -     Gastrointestinal Pathogen Panel PCR; Future  Primary osteoarthritis involving multiple joints -     oxyCODONE (OXY IR/ROXICODONE) 5 MG immediate release tablet; Take 1 tablet (5 mg total) by mouth every 6 (six) hours as needed for severe pain.   I have discontinued Cody Hale methylPREDNISolone and potassium chloride. I am also having him maintain his nitroGLYCERIN, fluticasone, azelastine, carvedilol, metFORMIN, simvastatin, torsemide, valsartan, VIIBRYD, XARELTO, DAILY VITE, potassium chloride SA, levocetirizine, Insulin Glargine, oxyCODONE, and metolazone.  Meds ordered this encounter  Medications  . DISCONTD: potassium chloride (K-DUR) 10 MEQ tablet  . DISCONTD: metolazone (ZAROXOLYN) 2.5 MG tablet    Sig: Take 1 tablet (2.5 mg total) by mouth every other day.    Dispense:  15 tablet    Refill:  1  . oxyCODONE (OXY IR/ROXICODONE) 5 MG immediate release tablet    Sig: Take 1 tablet (5 mg total) by mouth every 6 (six) hours as needed for severe pain.    Dispense:  75 tablet    Refill:  0    Fill on or after 12/31/16  . metolazone (ZAROXOLYN) 2.5 MG tablet    Sig: Take 1 tablet (2.5 mg total) by mouth every other day.    Dispense:  15 tablet    Refill:  1     Follow-up: Return in about 3 weeks (around 01/21/2017).  Sanda Linger, MD

## 2016-12-31 NOTE — Telephone Encounter (Signed)
Please resend metolazone (ZAROXOLYN) 2.5 MG tablet To RX care

## 2017-01-01 NOTE — Telephone Encounter (Signed)
This has been sent to pt pharmacy as requested.

## 2017-01-13 ENCOUNTER — Other Ambulatory Visit: Payer: Self-pay | Admitting: Internal Medicine

## 2017-01-20 ENCOUNTER — Encounter (INDEPENDENT_AMBULATORY_CARE_PROVIDER_SITE_OTHER): Payer: Self-pay | Admitting: Orthopaedic Surgery

## 2017-01-20 ENCOUNTER — Ambulatory Visit (INDEPENDENT_AMBULATORY_CARE_PROVIDER_SITE_OTHER): Payer: Medicare Other

## 2017-01-20 ENCOUNTER — Ambulatory Visit (INDEPENDENT_AMBULATORY_CARE_PROVIDER_SITE_OTHER): Payer: Medicare Other | Admitting: Orthopaedic Surgery

## 2017-01-20 DIAGNOSIS — M5442 Lumbago with sciatica, left side: Secondary | ICD-10-CM | POA: Diagnosis not present

## 2017-01-20 DIAGNOSIS — M25551 Pain in right hip: Secondary | ICD-10-CM

## 2017-01-20 DIAGNOSIS — G8929 Other chronic pain: Secondary | ICD-10-CM | POA: Diagnosis not present

## 2017-01-20 DIAGNOSIS — M25552 Pain in left hip: Secondary | ICD-10-CM

## 2017-01-20 NOTE — Addendum Note (Signed)
Addended by: Penne Lash, Otis Dials on: 01/20/2017 02:36 PM   Modules accepted: Orders

## 2017-01-20 NOTE — Progress Notes (Signed)
Cody Hale is mainly coming in today for follow-up 3 months after having a hyaluronic acid injection in his right knee. He said that is helped greatly. He says his left side this would certainly him. He says it hurts from his backside always down to his foot. He denies any groin pain on either side.  On examination of his knees neither knee has an effusion but these are nice and cool and move well. His hips though have severe limitations with internal/external rotation. 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 Of note he does ambulate with a cane and has a hard time getting out of a chair. 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  At this point I think it benefit from physical therapy just to just work on helping him with his posture, balance, coordination, core strengthening and lower body strengthening. They can potentially help on upper body strengthening as well to help him get in and out of a chair more easily. He is agreeable to trying some outpatient therapy. We'll see him back in follow-up in about 3 months to see how he is done overall.

## 2017-01-21 ENCOUNTER — Ambulatory Visit (INDEPENDENT_AMBULATORY_CARE_PROVIDER_SITE_OTHER): Payer: Medicare Other | Admitting: Internal Medicine

## 2017-01-21 ENCOUNTER — Encounter: Payer: Self-pay | Admitting: Internal Medicine

## 2017-01-21 ENCOUNTER — Other Ambulatory Visit: Payer: Self-pay | Admitting: Internal Medicine

## 2017-01-21 VITALS — BP 130/60 | HR 76 | Temp 98.1°F | Resp 16 | Ht 69.0 in | Wt 286.0 lb

## 2017-01-21 DIAGNOSIS — E118 Type 2 diabetes mellitus with unspecified complications: Secondary | ICD-10-CM

## 2017-01-21 DIAGNOSIS — Z794 Long term (current) use of insulin: Secondary | ICD-10-CM

## 2017-01-21 DIAGNOSIS — I10 Essential (primary) hypertension: Secondary | ICD-10-CM | POA: Diagnosis not present

## 2017-01-21 DIAGNOSIS — R600 Localized edema: Secondary | ICD-10-CM

## 2017-01-21 DIAGNOSIS — I5022 Chronic systolic (congestive) heart failure: Secondary | ICD-10-CM

## 2017-01-21 DIAGNOSIS — J301 Allergic rhinitis due to pollen: Secondary | ICD-10-CM

## 2017-01-21 MED ORDER — CANAGLIFLOZIN 100 MG PO TABS: 100.0000 mg | ORAL_TABLET | Freq: Every day | ORAL | 1 refills | Status: DC

## 2017-01-21 NOTE — Patient Instructions (Signed)

## 2017-01-21 NOTE — Progress Notes (Signed)
Subjective:  Patient ID: Cody Hale, male    DOB: 11/16/1950  Age: 66 y.o. MRN: [id number]  CC: Hypertension and Diabetes   HPI Cody Hale presents for f/up - he complains that his blood sugars have consistently been >200 for several weeks. He is having trouble controlling his intake of carbohydrates and calories. He denies any recent episodes of visual disturbance, polyuria, polydipsia, or polyphagia. He was seen a few weeks ago for diarrhea and fortunately he tells me the diarrhea has resolved.  Outpatient Medications Prior to Visit  Medication Sig Dispense Refill  . azelastine (ASTELIN) 0.1 % nasal spray Place 1 spray into both nostrils 2 (two) times daily. Use in each nostril as directed 30 mL 12  . carvedilol (COREG) 6.25 MG tablet TAKE 1 TABLET BY MOUTH TWICE DAILY. 60 tablet 11  . fluticasone (FLONASE) 50 MCG/ACT nasal spray Place 2 sprays into both nostrils daily. 16 g 11  . Insulin Glargine (LANTUS SOLOSTAR) 100 UNIT/ML Solostar Pen Inject 55 Units into the skin daily at 10 pm. 15 mL 3  . Multiple Vitamin (DAILY VITE) TABS TAKE 1 TABLET BY MOUTH ONCE DAILY. 30 each 11  . nitroGLYCERIN (NITROSTAT) 0.4 MG SL tablet Place 1 tablet (0.4 mg total) under the tongue every 5 (five) minutes x 3 doses as needed for chest pain. 25 tablet 3  . NOVOFINE AUTOCOVER 30G X 8 MM MISC USE AS DIRECTED FOR INSULIN ADMINISTRATION. 100 each 11  . oxyCODONE (OXY IR/ROXICODONE) 5 MG immediate release tablet Take 1 tablet (5 mg total) by mouth every 6 (six) hours as needed for severe pain. 75 tablet 0  . torsemide (DEMADEX) 20 MG tablet TAKE 2 TABLETS (40mg ) BY MOUTH TWICE DAILY. 120 tablet 6  . VIIBRYD 40 MG TABS TAKE 1 TABLET BY MOUTH ONCE DAILY. 30 tablet 11  . XARELTO 20 MG TABS tablet TAKE 1 TABLET BY MOUTH ONCE DAILY. 30 tablet 5  . levocetirizine (XYZAL) 5 MG tablet Take 1 tablet (5 mg total) by mouth every evening. 90 tablet 3  . metFORMIN (GLUCOPHAGE-XR) 750 MG 24 hr tablet TAKE 2 TABLETS BY  MOUTH EACH MORNING WITH BREAKFAST. 60 tablet 5  . metolazone (ZAROXOLYN) 2.5 MG tablet Take 1 tablet (2.5 mg total) by mouth every other day. 15 tablet 1  . simvastatin (ZOCOR) 20 MG tablet TAKE 1 TABLET BY MOUTH ONCE DAILY. 30 tablet 11  . valsartan (DIOVAN) 40 MG tablet TAKE 1 TABLET BY MOUTH TWICE DAILY. 60 tablet 11  . potassium chloride SA (KLOR-CON M20) 20 MEQ tablet Take 1 tablet (20 mEq total) by mouth daily. 90 tablet 3   No facility-administered medications prior to visit.     ROS Review of Systems  Constitutional: Negative.  Negative for appetite change, diaphoresis, fatigue and unexpected weight change.  HENT: Negative.  Negative for trouble swallowing.   Eyes: Negative for visual disturbance.  Respiratory: Negative.  Negative for chest tightness, shortness of breath and wheezing.   Cardiovascular: Negative for chest pain, palpitations and leg swelling.  Gastrointestinal: Negative for abdominal pain, constipation, diarrhea, nausea and vomiting.  Endocrine: Negative.  Negative for polydipsia, polyphagia and polyuria.  Genitourinary: Negative.  Negative for difficulty urinating, hematuria and urgency.  Musculoskeletal: Positive for arthralgias. Negative for back pain, myalgias and neck pain.  Skin: Negative.   Allergic/Immunologic: Negative.   Neurological: Negative.  Negative for dizziness, weakness, light-headedness and headaches.  Hematological: Negative for adenopathy. Does not bruise/bleed easily.  Psychiatric/Behavioral: Negative.  Objective:  BP 130/60 (BP Location: Left Arm, Patient Position: Sitting, Cuff Size: Normal)   Pulse 76   Temp 98.1 F (36.7 C) (Oral)   Resp 16   Ht 5\' 9"  (1.753 m)   Wt 286 lb (129.7 kg)   SpO2 97%   BMI 42.23 kg/m   BP Readings from Last 3 Encounters:  01/21/17 130/60  12/31/16 (!) 152/72  10/03/16 122/60    Wt Readings from Last 3 Encounters:  01/21/17 286 lb (129.7 kg)  12/31/16 297 lb 8 oz (134.9 kg)  10/03/16 283 lb  8 oz (128.6 kg)    Physical Exam  Constitutional: He is oriented to person, place, and time. No distress.  HENT:  Mouth/Throat: Oropharynx is clear and moist. No oropharyngeal exudate.  Eyes: Conjunctivae are normal. Right eye exhibits no discharge. Left eye exhibits no discharge. No scleral icterus.  Neck: Normal range of motion. Neck supple. No JVD present. No thyromegaly present.  Cardiovascular: Normal rate, regular rhythm and intact distal pulses.  Exam reveals no friction rub.   Murmur heard. Pulmonary/Chest: Effort normal and breath sounds normal. No respiratory distress. He has no wheezes. He has no rales. He exhibits no tenderness.  Abdominal: Soft. Bowel sounds are normal. He exhibits no distension and no mass. There is no tenderness. There is no rebound and no guarding.  Musculoskeletal: Normal range of motion. He exhibits no edema, tenderness or deformity.  Lymphadenopathy:    He has no cervical adenopathy.  Neurological: He is alert and oriented to person, place, and time.  Skin: Skin is warm and dry. No rash noted. He is not diaphoretic. No erythema. No pallor.  Vitals reviewed.   Lab Results  Component Value Date   WBC 7.1 12/31/2016   HGB 13.0 12/31/2016   HCT 39.4 12/31/2016   PLT 142.0 (L) 12/31/2016   GLUCOSE 210 (H) 12/31/2016   CHOL 102 03/13/2016   TRIG 108.0 03/13/2016   HDL 46.80 03/13/2016   LDLDIRECT 84.2 01/31/2014   LDLCALC 34 03/13/2016   ALT 28 12/31/2016   AST 22 12/31/2016   NA 141 12/31/2016   K 4.0 12/31/2016   CL 100 12/31/2016   CREATININE 1.12 12/31/2016   BUN 15 12/31/2016   CO2 35 (H) 12/31/2016   TSH 0.80 12/31/2016   PSA 0.36 05/30/2014   INR 1.13 11/30/2012   HGBA1C 7.3 12/31/2016   MICROALBUR 19.9 (H) 07/11/2016    No results found.  Assessment & Plan:   Cody Hale was seen today for hypertension and diabetes.  Diagnoses and all orders for this visit:  Type 2 diabetes mellitus with complication, with long-term current use  of insulin (HCC)- will add an SGLT-2 inh for better blood sugar control -     canagliflozin (INVOKANA) 100 MG TABS tablet; Take 1 tablet (100 mg total) by mouth daily before breakfast.  Morbid obesity (HCC)- he agrees to decrease his caloric intake to lose weight  Essential hypertension, malignant- his BP is well controlled   I am having Mr. Blanke start on canagliflozin. I am also having him maintain his nitroGLYCERIN, fluticasone, azelastine, carvedilol, torsemide, VIIBRYD, XARELTO, DAILY VITE, potassium chloride SA, Insulin Glargine, oxyCODONE, and NOVOFINE AUTOCOVER.  Meds ordered this encounter  Medications  . canagliflozin (INVOKANA) 100 MG TABS tablet    Sig: Take 1 tablet (100 mg total) by mouth daily before breakfast.    Dispense:  90 tablet    Refill:  1     Follow-up: Return in about 4 months (  around 05/24/2017).  Sanda Linger, MD

## 2017-01-22 ENCOUNTER — Telehealth: Payer: Self-pay | Admitting: Internal Medicine

## 2017-01-22 ENCOUNTER — Other Ambulatory Visit: Payer: Self-pay | Admitting: Internal Medicine

## 2017-01-22 DIAGNOSIS — I10 Essential (primary) hypertension: Secondary | ICD-10-CM

## 2017-01-22 DIAGNOSIS — E118 Type 2 diabetes mellitus with unspecified complications: Secondary | ICD-10-CM

## 2017-01-22 MED ORDER — TELMISARTAN 80 MG PO TABS: 80.0000 mg | ORAL_TABLET | Freq: Every day | ORAL | 1 refills | Status: DC

## 2017-01-22 NOTE — Telephone Encounter (Signed)
pharamcy called in, pt on valsartan and wants to change to something else.     Pharmacy  [phone]

## 2017-01-26 ENCOUNTER — Other Ambulatory Visit: Payer: Self-pay | Admitting: Internal Medicine

## 2017-01-26 DIAGNOSIS — I5022 Chronic systolic (congestive) heart failure: Secondary | ICD-10-CM

## 2017-01-26 DIAGNOSIS — I10 Essential (primary) hypertension: Secondary | ICD-10-CM

## 2017-01-29 ENCOUNTER — Other Ambulatory Visit: Payer: Self-pay | Admitting: Internal Medicine

## 2017-01-29 DIAGNOSIS — Z794 Long term (current) use of insulin: Principal | ICD-10-CM

## 2017-01-29 DIAGNOSIS — E118 Type 2 diabetes mellitus with unspecified complications: Secondary | ICD-10-CM

## 2017-01-29 MED ORDER — INSULIN LISPRO 100 UNIT/ML (KWIKPEN): 5.0000 [IU] | PEN_INJECTOR | Freq: Three times a day (TID) | SUBCUTANEOUS | 11 refills | Status: DC

## 2017-02-03 ENCOUNTER — Telehealth (HOSPITAL_COMMUNITY): Payer: Self-pay

## 2017-02-03 ENCOUNTER — Encounter (INDEPENDENT_AMBULATORY_CARE_PROVIDER_SITE_OTHER): Payer: Self-pay

## 2017-02-03 ENCOUNTER — Encounter (HOSPITAL_COMMUNITY): Payer: Self-pay

## 2017-02-03 ENCOUNTER — Ambulatory Visit (HOSPITAL_COMMUNITY): Payer: Medicare Other | Attending: Orthopaedic Surgery

## 2017-02-03 DIAGNOSIS — R2689 Other abnormalities of gait and mobility: Secondary | ICD-10-CM | POA: Diagnosis not present

## 2017-02-03 DIAGNOSIS — R262 Difficulty in walking, not elsewhere classified: Secondary | ICD-10-CM | POA: Diagnosis not present

## 2017-02-03 DIAGNOSIS — R29898 Other symptoms and signs involving the musculoskeletal system: Secondary | ICD-10-CM | POA: Diagnosis not present

## 2017-02-03 DIAGNOSIS — M25552 Pain in left hip: Secondary | ICD-10-CM

## 2017-02-03 DIAGNOSIS — M6281 Muscle weakness (generalized): Secondary | ICD-10-CM | POA: Diagnosis not present
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
## 2017-02-03 NOTE — Telephone Encounter (Signed)
Cody Hale called from [place] and changed this apptment to [facility], they must have a 3 day notice to transport. NF 02/03/17

## 2017-02-03 NOTE — Patient Instructions (Signed)
  SIT TO STAND - NO SUPPORT  Start by scooting close to the front of the chair.  Next, lean forward at your trunk and reach forward with your arms and rise to standing without using your hands to push off from the chair or other object.   Use your arms as a counter-balance by reaching forward when in sitting and lower them as you approach standing.   Perform 1x/day, 2-3 sets of 10 reps each.

## 2017-02-06 ENCOUNTER — Encounter (HOSPITAL_COMMUNITY): Payer: Self-pay

## 2017-02-07 ENCOUNTER — Ambulatory Visit (HOSPITAL_COMMUNITY): Payer: Medicare Other | Attending: Orthopaedic Surgery

## 2017-02-07 DIAGNOSIS — R29898 Other symptoms and signs involving the musculoskeletal system: Secondary | ICD-10-CM | POA: Insufficient documentation

## 2017-02-07 DIAGNOSIS — R2689 Other abnormalities of gait and mobility: Secondary | ICD-10-CM | POA: Insufficient documentation

## 2017-02-07 DIAGNOSIS — R262 Difficulty in walking, not elsewhere classified: Secondary | ICD-10-CM | POA: Insufficient documentation

## 2017-02-07 DIAGNOSIS — M6281 Muscle weakness (generalized): Secondary | ICD-10-CM

## 2017-02-07 DIAGNOSIS — M25552 Pain in left hip: Secondary | ICD-10-CM

## 2017-02-07 NOTE — Patient Instructions (Signed)
Getting Into / Out of Bed    Lower self to lie down on one side by raising legs and lowering head at the same time. Use arms to assist moving without twisting. Bend both knees to roll onto back if desired. To sit up, start from lying on side, and use same move-ments in reverse. Keep trunk aligned with legs.   Copyright  VHI. All rights reserved.   Knee Roll    Lying on back, with knees bent and feet flat on floor, arms outstretched to sides, slowly roll both knees to side, hold 5 seconds. Back to starting position, hold 5 seconds. Then to opposite side, hold 5 seconds. Return to starting position. Keep shoulders and arms in contact with floor.   Copyright  VHI. All rights reserved.
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
## 2017-02-10 ENCOUNTER — Emergency Department (HOSPITAL_COMMUNITY): Payer: Medicare Other

## 2017-02-10 ENCOUNTER — Inpatient Hospital Stay (HOSPITAL_COMMUNITY)
Admission: EM | Admit: 2017-02-10 | Discharge: 2017-02-13 | DRG: 871 | Disposition: A | Discharge status: Skilled Nursing Facility | Payer: Medicare Other | Attending: Internal Medicine | Admitting: Internal Medicine

## 2017-02-10 ENCOUNTER — Telehealth (HOSPITAL_COMMUNITY): Payer: Self-pay | Admitting: Internal Medicine

## 2017-02-10 ENCOUNTER — Ambulatory Visit (HOSPITAL_COMMUNITY): Payer: Medicare Other

## 2017-02-10 ENCOUNTER — Inpatient Hospital Stay (HOSPITAL_COMMUNITY): Payer: Medicare Other

## 2017-02-10 ENCOUNTER — Encounter (HOSPITAL_COMMUNITY): Payer: Self-pay | Admitting: *Deleted

## 2017-02-10 DIAGNOSIS — R6521 Severe sepsis with septic shock: Secondary | ICD-10-CM | POA: Diagnosis present

## 2017-02-10 DIAGNOSIS — M25562 Pain in left knee: Secondary | ICD-10-CM | POA: Diagnosis not present

## 2017-02-10 DIAGNOSIS — Z91048 Other nonmedicinal substance allergy status: Secondary | ICD-10-CM

## 2017-02-10 DIAGNOSIS — I38 Endocarditis, valve unspecified: Secondary | ICD-10-CM | POA: Diagnosis present

## 2017-02-10 DIAGNOSIS — N1330 Unspecified hydronephrosis: Secondary | ICD-10-CM | POA: Diagnosis present

## 2017-02-10 DIAGNOSIS — E1122 Type 2 diabetes mellitus with diabetic chronic kidney disease: Secondary | ICD-10-CM | POA: Diagnosis present

## 2017-02-10 DIAGNOSIS — I959 Hypotension, unspecified: Secondary | ICD-10-CM

## 2017-02-10 DIAGNOSIS — Z8041 Family history of malignant neoplasm of ovary: Secondary | ICD-10-CM | POA: Diagnosis not present

## 2017-02-10 DIAGNOSIS — G4733 Obstructive sleep apnea (adult) (pediatric): Secondary | ICD-10-CM | POA: Diagnosis not present

## 2017-02-10 DIAGNOSIS — Z794 Long term (current) use of insulin: Secondary | ICD-10-CM

## 2017-02-10 DIAGNOSIS — Z6835 Body mass index (BMI) 35.0-35.9, adult: Secondary | ICD-10-CM

## 2017-02-10 DIAGNOSIS — M170 Bilateral primary osteoarthritis of knee: Secondary | ICD-10-CM | POA: Diagnosis present

## 2017-02-10 DIAGNOSIS — Z87891 Personal history of nicotine dependence: Secondary | ICD-10-CM

## 2017-02-10 DIAGNOSIS — I447 Left bundle-branch block, unspecified: Secondary | ICD-10-CM | POA: Diagnosis present

## 2017-02-10 DIAGNOSIS — Z8249 Family history of ischemic heart disease and other diseases of the circulatory system: Secondary | ICD-10-CM

## 2017-02-10 DIAGNOSIS — M179 Osteoarthritis of knee, unspecified: Secondary | ICD-10-CM | POA: Diagnosis not present

## 2017-02-10 DIAGNOSIS — R531 Weakness: Secondary | ICD-10-CM

## 2017-02-10 DIAGNOSIS — E860 Dehydration: Secondary | ICD-10-CM | POA: Diagnosis not present

## 2017-02-10 DIAGNOSIS — M7042 Prepatellar bursitis, left knee: Secondary | ICD-10-CM | POA: Diagnosis present

## 2017-02-10 DIAGNOSIS — I482 Chronic atrial fibrillation, unspecified: Secondary | ICD-10-CM | POA: Diagnosis present

## 2017-02-10 DIAGNOSIS — J449 Chronic obstructive pulmonary disease, unspecified: Secondary | ICD-10-CM | POA: Diagnosis present

## 2017-02-10 DIAGNOSIS — I5022 Chronic systolic (congestive) heart failure: Secondary | ICD-10-CM | POA: Diagnosis not present

## 2017-02-10 DIAGNOSIS — I872 Venous insufficiency (chronic) (peripheral): Secondary | ICD-10-CM | POA: Diagnosis present

## 2017-02-10 DIAGNOSIS — R6883 Chills (without fever): Secondary | ICD-10-CM | POA: Diagnosis not present

## 2017-02-10 DIAGNOSIS — Z79891 Long term (current) use of opiate analgesic: Secondary | ICD-10-CM

## 2017-02-10 DIAGNOSIS — Z953 Presence of xenogenic heart valve: Secondary | ICD-10-CM | POA: Diagnosis not present

## 2017-02-10 DIAGNOSIS — E1151 Type 2 diabetes mellitus with diabetic peripheral angiopathy without gangrene: Secondary | ICD-10-CM | POA: Diagnosis present

## 2017-02-10 DIAGNOSIS — Z801 Family history of malignant neoplasm of trachea, bronchus and lung: Secondary | ICD-10-CM | POA: Diagnosis not present

## 2017-02-10 DIAGNOSIS — I2510 Atherosclerotic heart disease of native coronary artery without angina pectoris: Secondary | ICD-10-CM | POA: Diagnosis present

## 2017-02-10 DIAGNOSIS — J4489 Other specified chronic obstructive pulmonary disease: Secondary | ICD-10-CM | POA: Diagnosis present

## 2017-02-10 DIAGNOSIS — Z79899 Other long term (current) drug therapy: Secondary | ICD-10-CM

## 2017-02-10 DIAGNOSIS — R600 Localized edema: Secondary | ICD-10-CM | POA: Diagnosis not present

## 2017-02-10 DIAGNOSIS — E1143 Type 2 diabetes mellitus with diabetic autonomic (poly)neuropathy: Secondary | ICD-10-CM | POA: Diagnosis present

## 2017-02-10 DIAGNOSIS — Z833 Family history of diabetes mellitus: Secondary | ICD-10-CM

## 2017-02-10 DIAGNOSIS — N182 Chronic kidney disease, stage 2 (mild): Secondary | ICD-10-CM | POA: Diagnosis present

## 2017-02-10 DIAGNOSIS — E118 Type 2 diabetes mellitus with unspecified complications: Secondary | ICD-10-CM | POA: Diagnosis present

## 2017-02-10 DIAGNOSIS — N179 Acute kidney failure, unspecified: Secondary | ICD-10-CM | POA: Diagnosis not present

## 2017-02-10 DIAGNOSIS — A419 Sepsis, unspecified organism: Principal | ICD-10-CM | POA: Diagnosis present

## 2017-02-10 DIAGNOSIS — Z888 Allergy status to other drugs, medicaments and biological substances status: Secondary | ICD-10-CM

## 2017-02-10 DIAGNOSIS — Z87442 Personal history of urinary calculi: Secondary | ICD-10-CM

## 2017-02-10 DIAGNOSIS — Z811 Family history of alcohol abuse and dependence: Secondary | ICD-10-CM

## 2017-02-10 DIAGNOSIS — Z8261 Family history of arthritis: Secondary | ICD-10-CM | POA: Diagnosis not present

## 2017-02-10 DIAGNOSIS — E66812 Obesity, class 2: Secondary | ICD-10-CM | POA: Diagnosis present

## 2017-02-10 DIAGNOSIS — Z7901 Long term (current) use of anticoagulants: Secondary | ICD-10-CM

## 2017-02-10 DIAGNOSIS — R52 Pain, unspecified: Secondary | ICD-10-CM

## 2017-02-10 DIAGNOSIS — I130 Hypertensive heart and chronic kidney disease with heart failure and stage 1 through stage 4 chronic kidney disease, or unspecified chronic kidney disease: Secondary | ICD-10-CM | POA: Diagnosis present

## 2017-02-10 DIAGNOSIS — Z881 Allergy status to other antibiotic agents status: Secondary | ICD-10-CM

## 2017-02-10 LAB — URINALYSIS, ROUTINE W REFLEX MICROSCOPIC
Bilirubin Urine: NEGATIVE
Glucose, UA: 150 mg/dL — AB
Hgb urine dipstick: NEGATIVE
Ketones, ur: NEGATIVE mg/dL
Leukocytes, UA: NEGATIVE
Nitrite: NEGATIVE
Protein, ur: NEGATIVE mg/dL
Specific Gravity, Urine: 1.01 (ref 1.005–1.030)
pH: 5 (ref 5.0–8.0)

## 2017-02-10 LAB — BASIC METABOLIC PANEL
Anion gap: 13 (ref 5–15)
BUN: 116 mg/dL — ABNORMAL HIGH (ref 6–20)
CO2: 30 mmol/L (ref 22–32)
Calcium: 8.9 mg/dL (ref 8.9–10.3)
Chloride: 95 mmol/L — ABNORMAL LOW (ref 101–111)
Creatinine, Ser: 6.36 mg/dL — ABNORMAL HIGH (ref 0.61–1.24)
GFR calc Af Amer: 9 mL/min — ABNORMAL LOW (ref >60)
GFR calc non Af Amer: 8 mL/min — ABNORMAL LOW (ref >60)
Glucose, Bld: 172 mg/dL — ABNORMAL HIGH (ref 65–99)
Potassium: 4.7 mmol/L (ref 3.5–5.1)
Sodium: 138 mmol/L (ref 135–145)

## 2017-02-10 LAB — CBC WITH DIFFERENTIAL/PLATELET
Basophils Absolute: 0 10*3/uL (ref 0.0–0.1)
Basophils Relative: 0 %
Eosinophils Absolute: 0.1 10*3/uL (ref 0.0–0.7)
Eosinophils Relative: 1 %
HCT: 38.8 % — ABNORMAL LOW (ref 39.0–52.0)
Hemoglobin: 12.8 g/dL — ABNORMAL LOW (ref 13.0–17.0)
Lymphocytes Relative: 14 %
Lymphs Abs: 1 10*3/uL (ref 0.7–4.0)
MCH: 31.1 pg (ref 26.0–34.0)
MCHC: 33 g/dL (ref 30.0–36.0)
MCV: 94.2 fL (ref 78.0–100.0)
Monocytes Absolute: 0.7 10*3/uL (ref 0.1–1.0)
Monocytes Relative: 9 %
Neutro Abs: 5.3 10*3/uL (ref 1.7–7.7)
Neutrophils Relative %: 76 %
Platelets: 140 10*3/uL — ABNORMAL LOW (ref 150–400)
RBC: 4.12 MIL/uL — ABNORMAL LOW (ref 4.22–5.81)
RDW: 12.9 % (ref 11.5–15.5)
WBC: 7.1 10*3/uL (ref 4.0–10.5)

## 2017-02-10 LAB — LACTIC ACID, PLASMA
Lactic Acid, Venous: 2.8 mmol/L (ref 0.5–1.9)
Lactic Acid, Venous: 1.5 mmol/L (ref 0.5–1.9)
Lactic Acid, Venous: 1 mmol/L (ref 0.5–1.9)

## 2017-02-10 LAB — COMPREHENSIVE METABOLIC PANEL
ALT: 20 U/L (ref 17–63)
AST: 23 U/L (ref 15–41)
Albumin: 3.9 g/dL (ref 3.5–5.0)
Alkaline Phosphatase: 60 U/L (ref 38–126)
Anion gap: 16 — ABNORMAL HIGH (ref 5–15)
BUN: 130 mg/dL — ABNORMAL HIGH (ref 6–20)
CO2: 30 mmol/L (ref 22–32)
Calcium: 9.8 mg/dL (ref 8.9–10.3)
Chloride: 93 mmol/L — ABNORMAL LOW (ref 101–111)
Creatinine, Ser: 7.6 mg/dL — ABNORMAL HIGH (ref 0.61–1.24)
GFR calc Af Amer: 8 mL/min — ABNORMAL LOW (ref >60)
GFR calc non Af Amer: 7 mL/min — ABNORMAL LOW (ref >60)
Glucose, Bld: 149 mg/dL — ABNORMAL HIGH (ref 65–99)
Potassium: 5.1 mmol/L (ref 3.5–5.1)
Sodium: 139 mmol/L (ref 135–145)
Total Bilirubin: 1 mg/dL (ref 0.3–1.2)
Total Protein: 7.2 g/dL (ref 6.5–8.1)

## 2017-02-10 LAB — TROPONIN I
Troponin I: 0.04 ng/mL (ref <0.03)
Troponin I: 0.03 ng/mL (ref <0.03)
Troponin I: 0.03 ng/mL (ref <0.03)
Troponin I: 0.03 ng/mL (ref <0.03)

## 2017-02-10 LAB — APTT: aPTT: 40 seconds — ABNORMAL HIGH (ref 24–36)

## 2017-02-10 LAB — GLUCOSE, CAPILLARY
Glucose-Capillary: 154 mg/dL — ABNORMAL HIGH (ref 65–99)
Glucose-Capillary: 163 mg/dL — ABNORMAL HIGH (ref 65–99)
Glucose-Capillary: 170 mg/dL — ABNORMAL HIGH (ref 65–99)
Glucose-Capillary: 226 mg/dL — ABNORMAL HIGH (ref 65–99)

## 2017-02-10 LAB — MRSA PCR SCREENING: MRSA by PCR: NEGATIVE

## 2017-02-10 LAB — PROCALCITONIN: Procalcitonin: 0.14 ng/mL

## 2017-02-10 LAB — PROTIME-INR
INR: 2.61
Prothrombin Time: 28.4 seconds — ABNORMAL HIGH (ref 11.4–15.2)

## 2017-02-10 MED ORDER — ACETAMINOPHEN 650 MG RE SUPP: 650.0000 mg | Freq: Four times a day (QID) | RECTAL | Status: DC | PRN

## 2017-02-10 MED ORDER — SODIUM CHLORIDE 0.9 % IV BOLUS (SEPSIS)
1000.0000 mL | Freq: Once | INTRAVENOUS | Status: AC
  Administered 2017-02-10: 1000 mL via INTRAVENOUS

## 2017-02-10 MED ORDER — OXYCODONE HCL 5 MG PO TABS
5.0000 mg | ORAL_TABLET | Freq: Four times a day (QID) | ORAL | Status: DC | PRN
  Administered 2017-02-11 – 2017-02-13 (×5): 5 mg via ORAL
  Filled 2017-02-10 (×5): qty 1

## 2017-02-10 MED ORDER — PIPERACILLIN-TAZOBACTAM 3.375 G IVPB 30 MIN
3.3750 g | Freq: Once | INTRAVENOUS | Status: AC
  Administered 2017-02-10: 3.375 g via INTRAVENOUS
  Filled 2017-02-10: qty 50

## 2017-02-10 MED ORDER — SODIUM CHLORIDE 0.9 % IV SOLN
INTRAVENOUS | Status: DC
  Administered 2017-02-10 – 2017-02-11 (×3): via INTRAVENOUS

## 2017-02-10 MED ORDER — PIPERACILLIN SOD-TAZOBACTAM SO 2.25 (2-0.25) G IV SOLR
2.2500 g | Freq: Three times a day (TID) | INTRAVENOUS | Status: DC
  Administered 2017-02-10 – 2017-02-11 (×4): 2.25 g via INTRAVENOUS
  Filled 2017-02-10 (×7): qty 2.25

## 2017-02-10 MED ORDER — VANCOMYCIN HCL 10 G IV SOLR
1750.0000 mg | INTRAVENOUS | Status: DC
  Administered 2017-02-12: 1750 mg via INTRAVENOUS
  Filled 2017-02-10: qty 1750

## 2017-02-10 MED ORDER — INSULIN ASPART 100 UNIT/ML ~~LOC~~ SOLN
0.0000 [IU] | Freq: Three times a day (TID) | SUBCUTANEOUS | Status: DC
  Administered 2017-02-10 – 2017-02-11 (×4): 2 [IU] via SUBCUTANEOUS
  Administered 2017-02-11 – 2017-02-13 (×6): 3 [IU] via SUBCUTANEOUS
  Administered 2017-02-13: 5 [IU] via SUBCUTANEOUS

## 2017-02-10 MED ORDER — VANCOMYCIN HCL IN DEXTROSE 1-5 GM/200ML-% IV SOLN: 1000.0000 mg | Freq: Once | INTRAVENOUS | Status: DC

## 2017-02-10 MED ORDER — ACETAMINOPHEN 325 MG PO TABS
650.0000 mg | ORAL_TABLET | Freq: Four times a day (QID) | ORAL | Status: DC | PRN
  Administered 2017-02-11 – 2017-02-13 (×3): 650 mg via ORAL
  Filled 2017-02-10 (×3): qty 2

## 2017-02-10 MED ORDER — VANCOMYCIN HCL IN DEXTROSE 1-5 GM/200ML-% IV SOLN
1000.0000 mg | INTRAVENOUS | Status: AC
  Administered 2017-02-10 (×2): 1000 mg via INTRAVENOUS
  Filled 2017-02-10 (×2): qty 200

## 2017-02-10 NOTE — Progress Notes (Signed)
ANTIBIOTIC CONSULT NOTE-Preliminary  Pharmacy Consult for vancomycin Indication: sepsis  Allergies  Allergen Reactions  . Daptomycin Rash  . Lisinopril Cough  . Tape Rash and Other (See Comments)    Adhesive Tape-Burn skin.    Patient Measurements: Height: 5\' 9"  (175.3 cm) Weight: 282 lb (127.9 kg) IBW/kg (Calculated) : 70.7 Adjusted Body Weight:   Vital Signs: Temp: 99.2 F (37.3 C) (08/06 0230) Temp Source: Oral (08/06 0230) BP: 88/61 (08/06 0330) Pulse Rate: 85 (08/06 0330)  Labs:  Recent Labs  02/10/17 0236  WBC 7.1  HGB 12.8*  PLT 140*  CREATININE 7.60*    Estimated Creatinine Clearance: 12.7 mL/min (A) (by C-G formula based on SCr of 7.6 mg/dL (H)).  No results for input(s): VANCOTROUGH, VANCOPEAK, VANCORANDOM, GENTTROUGH, GENTPEAK, GENTRANDOM, TOBRATROUGH, TOBRAPEAK, TOBRARND, AMIKACINPEAK, AMIKACINTROU, AMIKACIN in the last 72 hours.   Microbiology: Recent Results (from the past 720 hour(s))  Blood Culture (routine x 2)     Status: None (Preliminary result)   Collection Time: 02/10/17  3:00 AM  Result Value Ref Range Status   Specimen Description RIGHT ANTECUBITAL  Final   Special Requests   Final    BOTTLES DRAWN AEROBIC AND ANAEROBIC Blood Culture adequate volume   Culture PENDING  Incomplete   Report Status PENDING  Incomplete  Blood Culture (routine x 2)     Status: None (Preliminary result)   Collection Time: 02/10/17  3:06 AM  Result Value Ref Range Status   Specimen Description BLOOD RIGHT HAND  Final   Special Requests   Final    BOTTLES DRAWN AEROBIC AND ANAEROBIC Blood Culture adequate volume   Culture PENDING  Incomplete   Report Status PENDING  Incomplete    Medical History: Past Medical History:  Diagnosis Date  . Anxiety   . Cellulitis   . Chronic systolic heart failure (HCC) 11/2012  . Chronic venous insufficiency   . Coronary atherosclerosis of native coronary artery    Mild nonobstructive 08/2012  . Degenerative joint  disease   . Diabetes mellitus, type II (HCC)    Gastroparesis; GI care at [facility]  . 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 s/p zyvox rx.  (Cultures never positive); left bundle branch block; H/o SVT; 09/2012: bioprosthetic MVR at [facility]; a. Severe dental caries and cavities s/p multiple extractions.  . Essential hypertension, benign   . 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 Left bundle branch block   . Left leg cellulitis   . Major depressive disorder, recurrent severe without psychotic features (HCC)    BH admission 12/2012  . Morbid obesity (HCC) 06/27/2012  . Nephrolithiasis   . PSVT (paroxysmal supraventricular tachycardia) (HCC)    Post-op at [facility]  . Urinary tract infection 08/30/2012   Proteus mirabilis    Medications:  Scheduled:   PRN:  Anti-infectives    Start     Dose/Rate Route Frequency Ordered Stop   02/10/17 0415  vancomycin (VANCOCIN) IVPB 1000 mg/200 mL premix     1,000 mg 200 mL/hr over 60 Minutes Intravenous Every 1 hr x 2 02/10/17 0327 02/10/17 0614   02/10/17 0315  piperacillin-tazobactam (ZOSYN) IVPB 3.375 g     3.375 g 100 mL/hr over 30 Minutes Intravenous  Once 02/10/17 0304 02/10/17 0351   02/10/17 0315  vancomycin (VANCOCIN) IVPB 1000 mg/200 mL premix  Status:  Discontinued     1,000 mg 200 mL/hr over 60 Minutes Intravenous  Once 02/10/17 0304 02/10/17 0327  Assessment: 66 yo male from long term care facility complaining of weakness and decreased appetite. Sepsis protocol started.    Goal of Therapy:  Vancomycin trough level 15-20 mcg/ml  Plan:  Preliminary review of pertinent patient information completed.  Protocol will be initiated with dose(s) of vancomycin 2 gram loading dose.  Jeani Hawking clinical pharmacist will complete review during morning rounds to assess patient and finalize treatment regimen if needed.  Deunta Beneke Scarlett, RPH 02/10/2017,3:54 AM

## 2017-02-10 NOTE — Telephone Encounter (Signed)
02/10/17  [facility] called to say that he was in the hospital

## 2017-02-10 NOTE — ED Notes (Signed)
Pt unable to give urine sample

## 2017-02-10 NOTE — ED Triage Notes (Signed)
Pt c/o generalized weakness, chills, fever for the past week, pt pale on arrival to er, bp 50/39

## 2017-02-10 NOTE — Progress Notes (Signed)
FOLLOW UP NOTE FROM EARLIER ADMISSION TODAY:  66 yo with hx of CHF, OSA, hx of endocarditis, s/p bioprosthetic MVR, afib on coumadin, COPD, admitted for septic shock into the ICU.  He was given 4 L of IVF, with continued hypotension, but better and mentating well.  His BP was 100 this am.  He is found to have acute severe AKI with Cr of 7, baseline being normal, and normal K.  He was started on IV Van/Zosyn, and his BP meds were held. Will continue with IVF, another liter bolus for now.  Continue with IVF at 100cc per hour.  If he requires pressor, be sure to give IV steroid.  US kidney showed mild left hydronephrosis.   WIll follow Cr.  If not improve expectantly, will consult nephrology.   [name] MD FACP. Hospitalist.
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
## 2017-02-10 NOTE — ED Notes (Signed)
Dr David at bedside,  
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
## 2017-02-10 NOTE — ED Notes (Signed)
CRITICAL VALUE ALERT  Critical Value: Troponin 0.04 Date & Time Notied:  02/10/17 @ 0327 Provider Notified:Dr. Lynelle Doctor Orders Received/Actions taken: None yet

## 2017-02-10 NOTE — ED Notes (Signed)
Lab at bedside for blood cultures,  

## 2017-02-10 NOTE — Progress Notes (Signed)
Pharmacy Antibiotic Note  Cody Hale is a 66 y.o. male admitted on 02/10/2017 with sepsis.  Pharmacy has been consulted for Vancomycin and Zosyn dosing.  Plan: Initial doses given.  Acute renal failure. Vancomycin 1750mg  IV every 48 hours.  Goal trough 15-20 mcg/mL.  Zosyn 2.25gm IV every 8 hours. Follow-up micro data, labs, vitals.  Height: 5\' 9"  (175.3 cm) Weight: 279 lb 1.6 oz (126.6 kg) IBW/kg (Calculated) : 70.7  Temp (24hrs), Avg:98.5 F (36.9 C), Min:97.9 F (36.6 C), Max:99.2 F (37.3 C)   Recent Labs Lab 02/10/17 0236 02/10/17 0605  WBC 7.1  --   CREATININE 7.60*  --   LATICACIDVEN 2.8* 1.5    Estimated Creatinine Clearance: 12.6 mL/min (A) (by C-G formula based on SCr of 7.6 mg/dL (H)).    Allergies  Allergen Reactions  . Daptomycin Rash  . Lisinopril Cough  . Tape Rash and Other (See Comments)    Adhesive Tape-Burn skin.   Antimicrobials this admission: Vanc 8/6 >>  Zosyn 8/6 >>   Dose adjustments this admission: Adjust for acute renal failure.  Obesity/Normalized CrCl Vancomycin dosing protocol will be initiated with an estimated normalized CrCl = 9.7 ml/min.  Microbiology results: 8/6 BCx: pending 8/6 UCx: pending   Sputum:    MRSA PCR:   Thank you for allowing pharmacy to be a part of this patient's care.  Cody Hale 02/10/2017 8:49 AM

## 2017-02-10 NOTE — ED Notes (Signed)
ED Provider at bedside. 

## 2017-02-10 NOTE — ED Notes (Signed)
CRITICAL VALUE ALERT  Critical Value:Lactic Acid 2.8 Date & Time Notied:  02/10/17 @ 0326 Provider Notified:Dr. Lynelle Doctor Orders Received/Actions taken:None yet

## 2017-02-10 NOTE — ED Notes (Signed)
Bladder scan performed, results of 4ml,

## 2017-02-11 DIAGNOSIS — R6883 Chills (without fever): Secondary | ICD-10-CM

## 2017-02-11 LAB — URINE CULTURE: Culture: NO GROWTH

## 2017-02-11 LAB — CBC
HCT: 39.7 % (ref 39.0–52.0)
Hemoglobin: 12.8 g/dL — ABNORMAL LOW (ref 13.0–17.0)
MCH: 31.3 pg (ref 26.0–34.0)
MCHC: 32.2 g/dL (ref 30.0–36.0)
MCV: 97.1 fL (ref 78.0–100.0)
Platelets: 117 10*3/uL — ABNORMAL LOW (ref 150–400)
RBC: 4.09 MIL/uL — ABNORMAL LOW (ref 4.22–5.81)
RDW: 13.1 % (ref 11.5–15.5)
WBC: 4.9 10*3/uL (ref 4.0–10.5)

## 2017-02-11 LAB — GLUCOSE, CAPILLARY
Glucose-Capillary: 181 mg/dL — ABNORMAL HIGH (ref 65–99)
Glucose-Capillary: 213 mg/dL — ABNORMAL HIGH (ref 65–99)
Glucose-Capillary: 244 mg/dL — ABNORMAL HIGH (ref 65–99)
Glucose-Capillary: 284 mg/dL — ABNORMAL HIGH (ref 65–99)

## 2017-02-11 LAB — COMPREHENSIVE METABOLIC PANEL
ALT: 19 U/L (ref 17–63)
AST: 16 U/L (ref 15–41)
Albumin: 3.7 g/dL (ref 3.5–5.0)
Alkaline Phosphatase: 55 U/L (ref 38–126)
Anion gap: 11 (ref 5–15)
BUN: 87 mg/dL — ABNORMAL HIGH (ref 6–20)
CO2: 33 mmol/L — ABNORMAL HIGH (ref 22–32)
Calcium: 9.7 mg/dL (ref 8.9–10.3)
Chloride: 100 mmol/L — ABNORMAL LOW (ref 101–111)
Creatinine, Ser: 3.28 mg/dL — ABNORMAL HIGH (ref 0.61–1.24)
GFR calc Af Amer: 21 mL/min — ABNORMAL LOW (ref >60)
GFR calc non Af Amer: 18 mL/min — ABNORMAL LOW (ref >60)
Glucose, Bld: 209 mg/dL — ABNORMAL HIGH (ref 65–99)
Potassium: 5 mmol/L (ref 3.5–5.1)
Sodium: 144 mmol/L (ref 135–145)
Total Bilirubin: 0.8 mg/dL (ref 0.3–1.2)
Total Protein: 7 g/dL (ref 6.5–8.1)

## 2017-02-11 MED ORDER — PIPERACILLIN-TAZOBACTAM 3.375 G IVPB
3.3750 g | Freq: Three times a day (TID) | INTRAVENOUS | Status: DC
  Administered 2017-02-11 – 2017-02-12 (×3): 3.375 g via INTRAVENOUS
  Filled 2017-02-11 (×3): qty 50

## 2017-02-11 NOTE — Plan of Care (Signed)
Problem: Fluid Volume: Goal: Hemodynamic stability will improve Outcome: Progressing Patient is hemodynamically stable at moment.

## 2017-02-11 NOTE — Clinical Social Work Note (Signed)
Clinical Social Work Assessment  Patient Details  Name: Cody Hale MRN: [id number] Date of Birth: June 14, 1951  Date of referral:  02/11/17               Reason for consult:  Discharge Planning                Permission sought to share information with:    Permission granted to share information::     Name::        Agency::     Relationship::     Contact Information:     Housing/Transportation Living arrangements for the past 2 months:  Assisted Living Facility Source of Information:  Facility Patient Interpreter Needed:  None Criminal Activity/Legal Involvement Pertinent to Current Situation/Hospitalization:  No - Comment as needed Significant Relationships:  Adult Children, Other Family Members, Other(Comment) Lives with:  Facility Resident Do you feel safe going back to the place where you live?  Yes Need for family participation in patient care:  Yes (Comment)  Care giving concerns:  None identified, facility resident.    Social Worker assessment / plan:  Per [name] at [facility], patient is a long term resident at [facility]. He ambulates with a cane, is active in the community, attends church with his family and receives assistance with ADLs from staff. Patient can return to the facility at discharge. Patient's family takes him out of the facility often.   Employment status:  Retired Health and safety inspector:  Medicare PT Recommendations:  Not assessed at this time Information / Referral to community resources:     Patient/Family's Response to care: LCSW will speak with patient once he is more stable.   Patient/Family's Understanding of and Emotional Response to Diagnosis, Current Treatment, and Prognosis:  LCSW will assess once patient is more stable.   Emotional Assessment Appearance:  Appears stated age Attitude/Demeanor/Rapport:    Affect (typically observed):  Unable to Assess Orientation:  Oriented to Self, Oriented to Place Alcohol / Substance use:    Psych  involvement (Current and /or in the community):  No (Comment)  Discharge Needs  Concerns to be addressed:  Discharge Planning Concerns Readmission within the last 30 days:  No Current discharge risk:  None Barriers to Discharge:  No Barriers Identified   Annice Needy, LCSW 02/11/2017, 4:18 PM

## 2017-02-11 NOTE — Progress Notes (Signed)
Patient experiencing sleep apnea. Patient will desat into the 60's while sleep. As soon as he is awaken his sats go into the 90's.

## 2017-02-11 NOTE — Progress Notes (Signed)
Patient resting in bed watching tv. Vital signs are stable. Saline lock patent. Patient medicated for left knee pain. Report called and will ask the receiving nurse Macon Large, RN to follow up with pain assessment. Patient to be transferred to room 339 via wheelchair with nursing staff.

## 2017-02-11 NOTE — Progress Notes (Signed)
PROGRESS NOTE    Cody Hale  ZOX:[id number] DOB: [birth year] DOA: 02/10/2017 PCP: [name], MD    Brief Narrative:  67 yo with hx of CHF, OSA, hx of endocarditis, s/p bioprosthetic MVR, afib on coumadin, COPD, admitted for septic shock into the ICU.  He has improved with IVF, and his BP is now 120 systolic.  His AKI is resolving.  Cultures have been negative thus far.    Assessment & Plan:   Principal Problem:   Sepsis (HCC) Active Problems:   Morbid obesity (HCC)   LBBB (left bundle branch block)   OSA (obstructive sleep apnea)   Type II diabetes mellitus with manifestations (HCC)   Endocarditis-resolved   COPD (chronic obstructive pulmonary disease) with chronic bronchitis (HCC)   Chronic systolic heart failure (HCC)   Chronic atrial fibrillation (HCC)   S/P mitral valve replacement with bioprosthetic valve   Acute renal failure (HCC)  1/  Septic shock:  Recovered nicely without the use of pressor.  WIll continue with same IV antibiotics today.  Transfer to floor and increase activities.  Source is still unclear.  Had hx of endocarditis, but unlikely to be related to that. 2/ AKI:  Resolving.  US showed mild left hydronephrosis.  Will follow Cr tomorrow.   3/ DM:  Will continue with SSI. 4/  Afib:  Xarelto being held due to AKI.  Please resume once AKI resolves completely.    DVT prophylaxis: Xarelto being held.  SubQ Heparin.  Code Status: FULL CODE.  Family Communication: None.  Disposition Plan: assisted living as before.   Consultants:   None.   Procedures:   None.   Antimicrobials: Anti-infectives    Start     Dose/Rate Route Frequency Ordered Stop   02/12/17 0500  vancomycin (VANCOCIN) 1,750 mg in sodium chloride 0.9 % 500 mL IVPB     1,750 mg 250 mL/hr over 120 Minutes Intravenous Every 48 hours 02/10/17 0905     02/10/17 1100  piperacillin-tazobactam (ZOSYN) 2.25 g in dextrose 5 % 50 mL IVPB     2.25 g 100 mL/hr over 30 Minutes Intravenous Every  8 hours 02/10/17 0854     02/10/17 0415  vancomycin (VANCOCIN) IVPB 1000 mg/200 mL premix     1,000 mg 200 mL/hr over 60 Minutes Intravenous Every 1 hr x 2 02/10/17 0327 02/10/17 0604   02/10/17 0315  piperacillin-tazobactam (ZOSYN) IVPB 3.375 g     3.375 g 100 mL/hr over 30 Minutes Intravenous  Once 02/10/17 0304 02/10/17 0351   02/10/17 0315  vancomycin (VANCOCIN) IVPB 1000 mg/200 mL premix  Status:  Discontinued     1,000 mg 200 mL/hr over 60 Minutes Intravenous  Once 02/10/17 0304 02/10/17 0327       Subjective:  Feeling well.    Objective: Vitals:   02/11/17 0500 02/11/17 0600 02/11/17 0700 02/11/17 0800  BP: 137/83 124/78 140/82 120/64  Pulse: 85 79 77 75  Resp: (!) 7 (!) 9 13 13   Temp:      TempSrc:      SpO2: 98% 92% 100% 100%  Weight: 126.6 kg (279 lb 1.6 oz)     Height:        Intake/Output Summary (Last 24 hours) at 02/11/17 0827 Last data filed at 02/11/17 0644  Gross per 24 hour  Intake          3158.34 ml  Output             4650 ml  Net         -1491.66 ml   Filed Weights   02/10/17 0231 02/10/17 0604 02/11/17 0500  Weight: 127.9 kg (282 lb) 126.6 kg (279 lb 1.6 oz) 126.6 kg (279 lb 1.6 oz)    Examination:  General exam: Appears calm and comfortable  Respiratory system: Clear to auscultation. Respiratory effort normal. Cardiovascular system: S1 & S2 heard, RRR. No JVD, murmurs, rubs, gallops or clicks. No pedal edema. Gastrointestinal system: Abdomen is nondistended, soft and nontender. No organomegaly or masses felt. Normal bowel sounds heard. Central nervous system: Alert and oriented. No focal neurological deficits. Extremities: Symmetric 5 x 5 power. Skin: No rashes, lesions or ulcers Psychiatry: Judgement and insight appear normal. Mood & affect appropriate.   Data Reviewed: I have personally reviewed following labs and imaging studies  CBC:  Recent Labs Lab 02/10/17 0236 02/11/17 0531  WBC 7.1 4.9  NEUTROABS 5.3  --   HGB 12.8* 12.8*   HCT 38.8* 39.7  MCV 94.2 97.1  PLT 140* 117*   Basic Metabolic Panel:  Recent Labs Lab 02/10/17 0236 02/10/17 0956 02/11/17 0531  NA 139 138 144  K 5.1 4.7 5.0  CL 93* 95* 100*  CO2 30 30 33*  GLUCOSE 149* 172* 209*  BUN 130* 116* 87*  CREATININE 7.60* 6.36* 3.28*  CALCIUM 9.8 8.9 9.7   GFR: Estimated Creatinine Clearance: 29.2 mL/min (A) (by C-G formula based on SCr of 3.28 mg/dL (H)). Liver Function Tests:  Recent Labs Lab 02/10/17 0236 02/11/17 0531  AST 23 16  ALT 20 19  ALKPHOS 60 55  BILITOT 1.0 0.8  PROT 7.2 7.0  ALBUMIN 3.9 3.7   Coagulation Profile:  Recent Labs Lab 02/10/17 0644  INR 2.61   Cardiac Enzymes:  Recent Labs Lab 02/10/17 0236 02/10/17 0644 02/10/17 0956 02/10/17 1902  TROPONINI 0.04* 0.03* 0.03* 0.03*   BNP (last 3 results)  Recent Labs  12/31/16 1149  PROBNP 87.0   CBG:  Recent Labs Lab 02/10/17 0806 02/10/17 1151 02/10/17 1648 02/10/17 2114  GLUCAP 154* 163* 170* 226*   Lipid Profile: Sepsis Labs:  Recent Labs Lab 02/10/17 0236 02/10/17 0605 02/10/17 0644 02/10/17 0943  PROCALCITON  --   --  0.14  --   LATICACIDVEN 2.8* 1.5  --  1.0    Recent Results (from the past 240 hour(s))  Blood Culture (routine x 2)     Status: None (Preliminary result)   Collection Time: 02/10/17  3:00 AM  Result Value Ref Range Status   Specimen Description RIGHT ANTECUBITAL  Final   Special Requests   Final    BOTTLES DRAWN AEROBIC AND ANAEROBIC Blood Culture adequate volume   Culture NO GROWTH 1 DAY  Final   Report Status PENDING  Incomplete  Blood Culture (routine x 2)     Status: None (Preliminary result)   Collection Time: 02/10/17  3:06 AM  Result Value Ref Range Status   Specimen Description BLOOD RIGHT HAND  Final   Special Requests   Final    BOTTLES DRAWN AEROBIC AND ANAEROBIC Blood Culture adequate volume   Culture NO GROWTH 1 DAY  Final   Report Status PENDING  Incomplete  MRSA PCR Screening     Status:  None   Collection Time: 02/10/17  5:54 AM  Result Value Ref Range Status   MRSA by PCR NEGATIVE NEGATIVE Final    Comment:        The GeneXpert MRSA Assay (FDA approved for NASAL  specimens only), is one component of a comprehensive MRSA colonization surveillance program. It is not intended to diagnose MRSA infection nor to guide or monitor treatment for MRSA infections.      Radiology Studies: US Renal  Result Date: 02/10/2017 CLINICAL DATA:  66 year old male with history of sepsis in urinary tract infection with acute kidney injury. EXAM: RENAL / URINARY TRACT ULTRASOUND COMPLETE COMPARISON:  No priors. FINDINGS: Right Kidney: Length: 12.4 cm. Echogenicity within normal limits. In the upper pole there is a well-defined 2.2 x 2.3 x 2.2 cm anechoic lesion with increased through transmission, compatible with a simple cyst. No hydronephrosis visualized. Left Kidney: Length: 12.5 cm. Echogenicity within normal limits. No mass visualized. Mild hydronephrosis. Bladder: Appears normal for degree of bladder distention. Urinary jets could not be visualized. IMPRESSION: 1. Mild left-sided hydronephrosis. If there is clinical concern for obstructive uropathy, further evaluation with noncontrast CT the abdomen and pelvis is suggested. 2. 2.2 x 2.3 x 2.2 cm simple cyst in the upper pole of the right kidney. Electronically Signed   By: Trudie Reed M.D.   On: 02/10/2017 11:59   Dg Chest Port 1 View  Result Date: 02/10/2017 CLINICAL DATA:  Initial evaluation for acute weakness. EXAM: PORTABLE CHEST 1 VIEW COMPARISON:  Prior radiograph from 05/20/2016. FINDINGS: Median sternotomy wires noted. Stable cardiomegaly. Mediastinal silhouette within normal limits. Lungs normally inflated. No focal infiltrates. No pulmonary edema or pleural effusion. No pneumothorax. No acute osseus abnormality. IMPRESSION: 1. No active cardiopulmonary disease. 2. Stable cardiomegaly without pulmonary edema. Electronically Signed    By: Rise Mu M.D.   On: 02/10/2017 02:55    Scheduled Meds: . insulin aspart  0-9 Units Subcutaneous TID WC   Continuous Infusions: . piperacillin-tazobactam (ZOSYN)  IV Stopped (02/11/17 0344)  . [START ON 02/12/2017] vancomycin       LOS: 1 day   Argil Mahl, MD FACP Hospitalist.   If 7PM-7AM, please contact night-coverage [email or web address] Password [facility] 02/11/2017, 8:27 AM

## 2017-02-11 NOTE — Progress Notes (Signed)
Pharmacy Antibiotic Note  Cody Hale is a 66 y.o. male admitted on 02/10/2017 with sepsis.  Pharmacy has been consulted for Vancomycin and Zosyn dosing.  Plan: Continue Vancomycin 1750mg  IV every 48 hours.  Goal trough 15-20 mcg/mL.  Increase Zosyn 3.375gm IV every 8 hours. Follow-up micro data, labs, vitals.  Height: 5\' 9"  (175.3 cm) Weight: 279 lb 1.6 oz (126.6 kg) IBW/kg (Calculated) : 70.7  Temp (24hrs), Avg:98 F (36.7 C), Min:97.4 F (36.3 C), Max:98.4 F (36.9 C)   Recent Labs Lab 02/10/17 0236 02/10/17 0605 02/10/17 0943 02/10/17 0956 02/11/17 0531  WBC 7.1  --   --   --  4.9  CREATININE 7.60*  --   --  6.36* 3.28*  LATICACIDVEN 2.8* 1.5 1.0  --   --     Estimated Creatinine Clearance: 29.2 mL/min (A) (by C-G formula based on SCr of 3.28 mg/dL (H)).    Allergies  Allergen Reactions  . Daptomycin Rash  . Lisinopril Cough  . Tape Rash and Other (See Comments)    Adhesive Tape-Burn skin.   Antimicrobials this admission: Vanc 8/6 >>  Zosyn 8/6 >>   Dose adjustments this admission: Adjust for acute renal failure.  Obesity/Normalized CrCl Vancomycin dosing protocol will be initiated with an estimated normalized CrCl = 22.4 ml/min.  Zosyn increased on 8/7  Microbiology results: 8/6 BCx: pending 8/6 UCx: pending   Sputum:    MRSA PCR: (-)  Thank you for allowing pharmacy to be a part of this patient's care.  [name] 02/11/2017 12:55 PM

## 2017-02-11 NOTE — Plan of Care (Signed)
Problem: Safety: Goal: Ability to remain free from injury will improve Outcome: Progressing Patient call light within reach. Patient call when needing assistance. Bed in lowest position. Bed alarm on. Belongings within reach.  Problem: Tissue Perfusion: Goal: Risk factors for ineffective tissue perfusion will decrease Outcome: Progressing Patient has SCD's on

## 2017-02-11 NOTE — Plan of Care (Signed)
Problem: Safety: Goal: Ability to remain free from injury will improve Outcome: Progressing Patient will remain free from injury by being frequently monitored and rounded on during this shift.

## 2017-02-12 ENCOUNTER — Inpatient Hospital Stay (HOSPITAL_COMMUNITY): Payer: Medicare Other

## 2017-02-12 DIAGNOSIS — N179 Acute kidney failure, unspecified: Secondary | ICD-10-CM

## 2017-02-12 DIAGNOSIS — A419 Sepsis, unspecified organism: Principal | ICD-10-CM

## 2017-02-12 LAB — GLUCOSE, CAPILLARY
Glucose-Capillary: 214 mg/dL — ABNORMAL HIGH (ref 65–99)
Glucose-Capillary: 236 mg/dL — ABNORMAL HIGH (ref 65–99)
Glucose-Capillary: 201 mg/dL — ABNORMAL HIGH (ref 65–99)
Glucose-Capillary: 254 mg/dL — ABNORMAL HIGH (ref 65–99)

## 2017-02-12 LAB — BASIC METABOLIC PANEL
Anion gap: 12 (ref 5–15)
BUN: 57 mg/dL — ABNORMAL HIGH (ref 6–20)
CO2: 31 mmol/L (ref 22–32)
Calcium: 9.8 mg/dL (ref 8.9–10.3)
Chloride: 100 mmol/L — ABNORMAL LOW (ref 101–111)
Creatinine, Ser: 1.98 mg/dL — ABNORMAL HIGH (ref 0.61–1.24)
GFR calc Af Amer: 39 mL/min — ABNORMAL LOW (ref >60)
GFR calc non Af Amer: 33 mL/min — ABNORMAL LOW (ref >60)
Glucose, Bld: 261 mg/dL — ABNORMAL HIGH (ref 65–99)
Potassium: 4 mmol/L (ref 3.5–5.1)
Sodium: 143 mmol/L (ref 135–145)

## 2017-02-12 LAB — LACTIC ACID, PLASMA: Lactic Acid, Venous: 0.8 mmol/L (ref 0.5–1.9)

## 2017-02-12 NOTE — Progress Notes (Signed)
Pt c/o severe left knee pain that is sudden, sore, 10/10. Pt states this pain just started yesterday. Upon assessment, knee is red, warm to touch and slightly edematous. PRN Oxycodone administered per orders and heat pack applied per patient request. Dr. Ardyth Harps paged and made aware.

## 2017-02-12 NOTE — Progress Notes (Signed)
Inpatient Diabetes Program Recommendations  AACE/ADA: New Consensus Statement on Inpatient Glycemic Control (2015)  Target Ranges:  Prepandial:   less than 140 mg/dL      Peak postprandial:   less than 180 mg/dL (1-2 hours)      Critically ill patients:  140 - 180 mg/dL   Results for Cody Hale, Cody Hale (MRN [id number]) as of 02/12/2017 12:27  Ref. Range 02/11/2017 08:35 02/11/2017 12:22 02/11/2017 16:53 02/11/2017 21:10  Glucose-Capillary Latest Ref Range: 65 - 99 mg/dL 063 (H) 016 (H) 010 (H) 284 (H)   Results for Cody Hale, Cody Hale (MRN [id number]) as of 02/12/2017 12:27  Ref. Range 02/12/2017 08:17 02/12/2017 11:18  Glucose-Capillary Latest Ref Range: 65 - 99 mg/dL 202 (H) 542 (H)    Admit with: Sepsis  History: DM, CHF  Home DM Meds: Lantus 58 units QHS       Humalog 5 units TID       Metformin 1500 mg daily       Invokana 100 mg daily  Current Insulin Orders: Novolog Sensitive Correction Scale/ SSI (0-9 units) TID AC       MD- Please consider the following in-hospital insulin adjustments:  1. Start Lantus 30 units QHS (50% total home dose)  2. Start Novolog Meal Coverage: Novolog 5 units TID with meals (hold if pt eats <50% of meal)      --Will follow patient during hospitalization--  Ambrose Finland RN, MSN, CDE Diabetes Coordinator Inpatient Glycemic Control Team Team Pager: [phone] (8a-5p)

## 2017-02-12 NOTE — Progress Notes (Signed)
PROGRESS NOTE    NHIA HEYMANN  MRA:[id number] DOB: Nov 02, 1950 DOA: 02/10/2017 PCP: [name], MD     Brief Narrative:  66 year old man admitted to the hospital on 8/6 due to fever and chills. Had an initial systolic blood pressure in the 50s, was admitted for septic shock of unclear etiology. He was improving and was transferred out of the ICU on 8/7. All culture data remains negative to date. Had acute renal failure that is improving.   Assessment & Plan:   Principal Problem:   Sepsis (HCC) Active Problems:   Morbid obesity (HCC)   LBBB (left bundle branch block)   OSA (obstructive sleep apnea)   Type II diabetes mellitus with manifestations (HCC)   Endocarditis-resolved   COPD (chronic obstructive pulmonary disease) with chronic bronchitis (HCC)   Chronic systolic heart failure (HCC)   Chronic atrial fibrillation (HCC)   S/P mitral valve replacement with bioprosthetic valve   Acute renal failure (HCC)   Septic shock -Etiology remains unclear. -All culture data remains negative to date. -Did not require pressor support. -Doubt endocarditis given negative blood cultures. He has a history of endocarditis and is status post a bioprosthetic mitral valve. -Given he is afebrile, no WBC count, will elect to discontinue broad-spectrum antibiotics today and follow clinically.  Left knee pain and edema -This is new since yesterday. -He has significantly limited range of motion and pain. -Denies injury or trauma. No history of gout. -Will check x-ray, will request orthopedic consultation for left knee arthrocentesis to send for crystals as well as cultures and Gram stain given his sepsis without source.   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  -Creatinine is down to 1.9 from 7.6 on admission,  with IV fluids.  -His baseline creatinine appears to be around 1.1.  A. fib  -Currently rate controlled,  -Resume xarelto  as long as kidney function continues to improve.  -Renal ultrasound shows mild left-sided hydronephrosis, will not investigate further with CT scan given rapid improvement in renal function with IV fluids and ongoing sepsis.    DVT prophylaxis: SCDs Code Status: Full code Family Communication: Patient only Disposition Plan: [facility] for DC home in 24-48 hours  Consultants:   None  Procedures:   None  Antimicrobials:  Anti-infectives    Start     Dose/Rate Route Frequency Ordered Stop   02/12/17 0500  vancomycin (VANCOCIN) 1,750 mg in sodium chloride 0.9 % 500 mL IVPB  Status:  Discontinued     1,750 mg 250 mL/hr over 120 Minutes Intravenous Every 48 hours 02/10/17 0905 02/12/17 1209   02/11/17 1600  piperacillin-tazobactam (ZOSYN) IVPB 3.375 g  Status:  Discontinued     3.375 g 12.5 mL/hr over 240 Minutes Intravenous Every 8 hours 02/11/17 1254 02/12/17 1209   02/10/17 1100  piperacillin-tazobactam (ZOSYN) 2.25 g in dextrose 5 % 50 mL IVPB  Status:  Discontinued     2.25 g 100 mL/hr over 30 Minutes Intravenous Every 8 hours 02/10/17 0854 02/11/17 1254   02/10/17 0415  vancomycin (VANCOCIN) IVPB 1000 mg/200 mL premix     1,000 mg 200 mL/hr over 60 Minutes Intravenous Every 1 hr x 2 02/10/17 0327 02/10/17 0604   02/10/17 0315  piperacillin-tazobactam (ZOSYN) IVPB 3.375 g     3.375 g 100 mL/hr over 30 Minutes Intravenous  Once 02/10/17 0304 02/10/17 0351   02/10/17 0315  vancomycin (VANCOCIN) IVPB 1000 mg/200 mL premix  Status:  Discontinued  1,000 mg 200 mL/hr over 60 Minutes Intravenous  Once 02/10/17 0304 02/10/17 0327       Subjective: Complains of left knee pain and edema that began yesterday afternoon.  Objective: Vitals:   02/11/17 1300 02/11/17 2200 02/12/17 0044 02/12/17 0706  BP: (!) 156/94 (!) 89/65 (!) 100/52 99/61  Pulse: 61 86 71 96  Resp: 20 20  20   Temp: 98.4 F (36.9 C) 98.2 F (36.8 C)  98.1 F (36.7 C)  TempSrc: Oral Oral  Oral  SpO2: 96% 98%   95%  Weight:      Height:        Intake/Output Summary (Last 24 hours) at 02/12/17 1704 Last data filed at 02/12/17 1323  Gross per 24 hour  Intake             1270 ml  Output             2675 ml  Net            -1405 ml   Filed Weights   02/10/17 0231 02/10/17 0604 02/11/17 0500  Weight: 127.9 kg (282 lb) 126.6 kg (279 lb 1.6 oz) 126.6 kg (279 lb 1.6 oz)    Examination:  General exam: Alert, awake, oriented x 3 Respiratory system: Clear to auscultation. Respiratory effort normal. Cardiovascular system:RRR. No murmurs, rubs, gallops. Gastrointestinal system: Abdomen is nondistended, soft and nontender. No organomegaly or masses felt. Normal bowel sounds heard. Central nervous system: Alert and oriented. No focal neurological deficits. Extremities: No C/C/E, +pedal pulses, left knee is edematous with severely limited range of motion and pain to palpation. Skin: No rashes, lesions or ulcers Psychiatry: Judgement and insight appear normal. Mood & affect appropriate.     Data Reviewed: I have personally reviewed following labs and imaging studies  CBC:  Recent Labs Lab 02/10/17 0236 02/11/17 0531  WBC 7.1 4.9  NEUTROABS 5.3  --   HGB 12.8* 12.8*  HCT 38.8* 39.7  MCV 94.2 97.1  PLT 140* 117*   Basic Metabolic Panel:  Recent Labs Lab 02/10/17 0236 02/10/17 0956 02/11/17 0531 02/12/17 0548  NA 139 138 144 143  K 5.1 4.7 5.0 4.0  CL 93* 95* 100* 100*  CO2 30 30 33* 31  GLUCOSE 149* 172* 209* 261*  BUN 130* 116* 87* 57*  CREATININE 7.60* 6.36* 3.28* 1.98*  CALCIUM 9.8 8.9 9.7 9.8   GFR: Estimated Creatinine Clearance: 48.3 mL/min (A) (by C-G formula based on SCr of 1.98 mg/dL (H)). Liver Function Tests:  Recent Labs Lab 02/10/17 0236 02/11/17 0531  AST 23 16  ALT 20 19  ALKPHOS 60 55  BILITOT 1.0 0.8  PROT 7.2 7.0  ALBUMIN 3.9 3.7   No results for input(s): LIPASE, AMYLASE in the last 168 hours. No results for input(s): AMMONIA in the last 168  hours. Coagulation Profile:  Recent Labs Lab 02/10/17 0644  INR 2.61   Cardiac Enzymes:  Recent Labs Lab 02/10/17 0236 02/10/17 0644 02/10/17 0956 02/10/17 1902  TROPONINI 0.04* 0.03* 0.03* 0.03*   BNP (last 3 results)  Recent Labs  12/31/16 1149  PROBNP 87.0   HbA1C: No results for input(s): HGBA1C in the last 72 hours. CBG:  Recent Labs Lab 02/11/17 1222 02/11/17 1653 02/11/17 2110 02/12/17 0817 02/12/17 1118  GLUCAP 213* 244* 284* 214* 236*   Lipid Profile: No results for input(s): CHOL, HDL, LDLCALC, TRIG, CHOLHDL, LDLDIRECT in the last 72 hours. Thyroid Function Tests: No results for input(s): TSH, T4TOTAL, FREET4, T3FREE,  THYROIDAB in the last 72 hours. Anemia Panel: No results for input(s): VITAMINB12, FOLATE, FERRITIN, TIBC, IRON, RETICCTPCT in the last 72 hours. Urine analysis:    Component Value Date/Time   COLORURINE YELLOW 02/10/2017 0236   APPEARANCEUR CLEAR 02/10/2017 0236   LABSPEC 1.010 02/10/2017 0236   PHURINE 5.0 02/10/2017 0236   GLUCOSEU 150 (A) 02/10/2017 0236   GLUCOSEU NEGATIVE 12/31/2016 1149   HGBUR NEGATIVE 02/10/2017 0236   BILIRUBINUR NEGATIVE 02/10/2017 0236   KETONESUR NEGATIVE 02/10/2017 0236   PROTEINUR NEGATIVE 02/10/2017 0236   UROBILINOGEN 0.2 12/31/2016 1149   NITRITE NEGATIVE 02/10/2017 0236   LEUKOCYTESUR NEGATIVE 02/10/2017 0236   Sepsis Labs: @LABRCNTIP (procalcitonin:4,lacticidven:4)  ) Recent Results (from the past 240 hour(s))  Urine culture     Status: None   Collection Time: 02/10/17  2:36 AM  Result Value Ref Range Status   Specimen Description URINE, CLEAN CATCH  Final   Special Requests NONE  Final   Culture   Final    NO GROWTH Performed at [facility], [address], [place], [place] [address]    Report Status 02/11/2017 FINAL  Final  Blood Culture (routine x 2)     Status: None (Preliminary result)   Collection Time: 02/10/17  3:00 AM  Result Value Ref Range Status   Specimen  Description RIGHT ANTECUBITAL  Final   Special Requests   Final    BOTTLES DRAWN AEROBIC AND ANAEROBIC Blood Culture adequate volume   Culture NO GROWTH 2 DAYS  Final   Report Status PENDING  Incomplete  Blood Culture (routine x 2)     Status: None (Preliminary result)   Collection Time: 02/10/17  3:06 AM  Result Value Ref Range Status   Specimen Description BLOOD RIGHT HAND  Final   Special Requests   Final    BOTTLES DRAWN AEROBIC AND ANAEROBIC Blood Culture adequate volume   Culture NO GROWTH 2 DAYS  Final   Report Status PENDING  Incomplete  MRSA PCR Screening     Status: None   Collection Time: 02/10/17  5:54 AM  Result Value Ref Range Status   MRSA by PCR NEGATIVE NEGATIVE Final    Comment:        The GeneXpert MRSA Assay (FDA approved for NASAL specimens only), is one component of a comprehensive MRSA colonization surveillance program. It is not intended to diagnose MRSA infection nor to guide or monitor treatment for MRSA infections.          Radiology Studies: No results found.      Scheduled Meds: . insulin aspart  0-9 Units Subcutaneous TID WC   Continuous Infusions:   LOS: 2 days    Time spent: 30 minutes. Greater than 50% of this time was spent in direct contact with the patient coordinating care.     Chaya Jan, MD [facility] Pager [phone]  If 7PM-7AM, please contact night-coverage [email or web address] Password [email or web address] 02/12/2017, 5:04 PM

## 2017-02-13 ENCOUNTER — Ambulatory Visit (HOSPITAL_COMMUNITY): Payer: Medicare Other

## 2017-02-13 ENCOUNTER — Telehealth (HOSPITAL_COMMUNITY): Payer: Self-pay

## 2017-02-13 ENCOUNTER — Telehealth (HOSPITAL_COMMUNITY): Payer: Self-pay | Admitting: Internal Medicine

## 2017-02-13 DIAGNOSIS — M7042 Prepatellar bursitis, left knee: Secondary | ICD-10-CM

## 2017-02-13 LAB — CBC
HCT: 40 % (ref 39.0–52.0)
Hemoglobin: 12.9 g/dL — ABNORMAL LOW (ref 13.0–17.0)
MCH: 31.2 pg (ref 26.0–34.0)
MCHC: 32.3 g/dL (ref 30.0–36.0)
MCV: 96.6 fL (ref 78.0–100.0)
Platelets: 118 10*3/uL — ABNORMAL LOW (ref 150–400)
RBC: 4.14 MIL/uL — ABNORMAL LOW (ref 4.22–5.81)
RDW: 13.1 % (ref 11.5–15.5)
WBC: 6.8 10*3/uL (ref 4.0–10.5)

## 2017-02-13 LAB — BASIC METABOLIC PANEL
Anion gap: 10 (ref 5–15)
BUN: 33 mg/dL — ABNORMAL HIGH (ref 6–20)
CO2: 31 mmol/L (ref 22–32)
Calcium: 9.8 mg/dL (ref 8.9–10.3)
Chloride: 99 mmol/L — ABNORMAL LOW (ref 101–111)
Creatinine, Ser: 1.36 mg/dL — ABNORMAL HIGH (ref 0.61–1.24)
GFR calc Af Amer: >60 mL/min (ref >60)
GFR calc non Af Amer: 53 mL/min — ABNORMAL LOW (ref >60)
Glucose, Bld: 232 mg/dL — ABNORMAL HIGH (ref 65–99)
Potassium: 3.8 mmol/L (ref 3.5–5.1)
Sodium: 140 mmol/L (ref 135–145)

## 2017-02-13 LAB — GLUCOSE, CAPILLARY
Glucose-Capillary: 244 mg/dL — ABNORMAL HIGH (ref 65–99)
Glucose-Capillary: 267 mg/dL — ABNORMAL HIGH (ref 65–99)

## 2017-02-13 MED ORDER — AMOXICILLIN-POT CLAVULANATE 875-125 MG PO TABS: 1.0000 | ORAL_TABLET | Freq: Two times a day (BID) | ORAL | 0 refills | Status: DC

## 2017-02-13 MED ORDER — RIVAROXABAN 20 MG PO TABS
20.0000 mg | ORAL_TABLET | Freq: Every day | ORAL | Status: DC
  Administered 2017-02-13: 20 mg via ORAL
  Filled 2017-02-13: qty 1

## 2017-02-13 MED ORDER — AMOXICILLIN-POT CLAVULANATE 875-125 MG PO TABS
1.0000 | ORAL_TABLET | Freq: Two times a day (BID) | ORAL | Status: DC
  Administered 2017-02-13: 1 via ORAL
  Filled 2017-02-13: qty 1

## 2017-02-13 NOTE — Progress Notes (Signed)
ANTICOAGULATION CONSULT NOTE - Initial Consult  Pharmacy Consult for xarelto Indication: atrial fibrillation  Allergies  Allergen Reactions  . Daptomycin Rash  . Lisinopril Cough  . Tape Rash and Other (See Comments)    Adhesive Tape-Burn skin.    Patient Measurements: Height: 5\' 9"  (175.3 cm) Weight: 279 lb 1.6 oz (126.6 kg) IBW/kg (Calculated) : 70.7   Vital Signs: Temp: 98.1 F (36.7 C) (08/09 0550) Temp Source: Oral (08/09 0550) BP: 119/66 (08/09 0550) Pulse Rate: 86 (08/09 0550)  Labs:  Recent Labs  02/10/17 0956 02/10/17 1902 02/11/17 0531 02/12/17 0548 02/13/17 0409  HGB  --   --  12.8*  --  12.9*  HCT  --   --  39.7  --  40.0  PLT  --   --  117*  --  118*  CREATININE 6.36*  --  3.28* 1.98* 1.36*  TROPONINI 0.03* 0.03*  --   --   --     Estimated Creatinine Clearance: 70.4 mL/min (A) (by C-G formula based on SCr of 1.36 mg/dL (H)).   Medical History: Past Medical History:  Diagnosis Date  . Anxiety   . Cellulitis   . Chronic systolic heart failure (HCC) 11/2012  . Chronic venous insufficiency   . Coronary atherosclerosis of native coronary artery    Mild nonobstructive 08/2012  . Degenerative joint disease   . Diabetes mellitus, type II (HCC)    Gastroparesis; [facility] at [facility]  . 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 s/p zyvox rx.  (Cultures never positive); left bundle branch block; H/o SVT; 09/2012: bioprosthetic MVR at [facility]; a. Severe dental caries and cavities s/p multiple extractions.  . Essential hypertension, benign   . 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 Left bundle branch block   . Left leg cellulitis   . Major depressive disorder, recurrent severe without psychotic features (HCC)    BH admission 12/2012  . Morbid obesity (HCC) 06/27/2012  . Nephrolithiasis   . PSVT (paroxysmal supraventricular tachycardia) (HCC)    Post-op at [facility]  . Urinary tract infection 08/30/2012   Proteus mirabilis     Medications:  Prescriptions Prior to Admission  Medication Sig Dispense Refill Last Dose  . canagliflozin (INVOKANA) 100 MG TABS tablet Take 1 tablet (100 mg total) by mouth daily before breakfast. 90 tablet 1 02/09/2017 at Unknown time  . carvedilol (COREG) 6.25 MG tablet TAKE 1 TABLET BY MOUTH TWICE DAILY. 60 tablet 11 02/09/2017 at 1700  . fluticasone (FLONASE) 50 MCG/ACT nasal spray Place 2 sprays into both nostrils daily. 16 g 11 02/09/2017 at Unknown time  . Insulin Glargine (LANTUS SOLOSTAR) 100 UNIT/ML Solostar Pen Inject 55 Units into the skin daily at 10 pm. (Patient taking differently: Inject 58 Units into the skin daily at 10 pm. ) 15 mL 3 02/09/2017 at Unknown time  . insulin lispro (HUMALOG) 100 UNIT/ML KiwkPen Inject 0.05 mLs (5 Units total) into the skin 3 (three) times daily. 15 mL 11 02/09/2017 at Unknown time  . levocetirizine (XYZAL) 5 MG tablet TAKE 1 TABLET BY MOUTH EACH EVENING. 30 tablet 5 02/09/2017 at Unknown time  . metFORMIN (GLUCOPHAGE-XR) 750 MG 24 hr tablet TAKE 2 TABLETS BY MOUTH EACH MORNING WITH BREAKFAST. 60 tablet 5 02/09/2017 at Unknown time  . metolazone (ZAROXOLYN) 2.5 MG tablet TAKE ONE TABLET BY MOUTH EVERY OTHER DAY. 15 tablet 3 02/08/2017 at Unknown time  . Multiple Vitamin (DAILY VITE) TABS TAKE 1 TABLET  BY MOUTH ONCE DAILY. 30 each 11 02/09/2017 at Unknown time  . nitroGLYCERIN (NITROSTAT) 0.4 MG SL tablet Place 1 tablet (0.4 mg total) under the tongue every 5 (five) minutes x 3 doses as needed for chest pain. 25 tablet 3 unknown  . oxyCODONE (OXY IR/ROXICODONE) 5 MG immediate release tablet Take 1 tablet (5 mg total) by mouth every 6 (six) hours as needed for severe pain. 75 tablet 0 Unknown at Unknown time  . potassium chloride SA (KLOR-CON M20) 20 MEQ tablet Take 1 tablet (20 mEq total) by mouth daily. 90 tablet 3 02/09/2017 at Unknown time  . simvastatin (ZOCOR) 20 MG tablet TAKE 1 TABLET BY MOUTH ONCE DAILY. 30 tablet 5 02/09/2017 at Unknown time  . torsemide  (DEMADEX) 20 MG tablet TAKE 2 TABLETS (40mg ) BY MOUTH TWICE DAILY. 120 tablet 6 02/09/2017 at Unknown time  . valsartan (DIOVAN) 40 MG tablet Take 1 tablet by mouth 2 (two) times daily.   02/09/2017 at Unknown time  . VIIBRYD 40 MG TABS TAKE 1 TABLET BY MOUTH ONCE DAILY. 30 tablet 11 02/09/2017 at 2100  . XARELTO 20 MG TABS tablet TAKE 1 TABLET BY MOUTH ONCE DAILY. 30 tablet 5 02/09/2017 at 1700  . NOVOFINE AUTOCOVER 30G X 8 MM MISC USE AS DIRECTED FOR INSULIN ADMINISTRATION. 100 each 11 Unknown at Unknown time    Assessment: 66 yo man to continue xarelto for afib. Goal of Therapy:  Therapeutic anticoagulation Monitor platelets by anticoagulation protocol: Yes   Plan:  Xarelto 20 mg po daily Monitor for bleeding complications  [name] 02/13/2017,8:20 AM

## 2017-02-13 NOTE — Progress Notes (Signed)
Inpatient Diabetes Program Recommendations  AACE/ADA: New Consensus Statement on Inpatient Glycemic Control (2015)  Target Ranges:  Prepandial:   less than 140 mg/dL      Peak postprandial:   less than 180 mg/dL (1-2 hours)      Critically ill patients:  140 - 180 mg/dL  Results for [name], [name] (MRN [id number]) as of 02/13/2017 07:31  Ref. Range 02/13/2017 04:09  Glucose Latest Ref Range: 65 - 99 mg/dL 419 (H)   Results for [name], [name] (MRN [id number]) as of 02/13/2017 07:31  Ref. Range 02/12/2017 08:17 02/12/2017 11:18 02/12/2017 17:07 02/12/2017 21:20  Glucose-Capillary Latest Ref Range: 65 - 99 mg/dL 211 (H) 941 (H) 740 (H) 254 (H)   Review of Glycemic Control  Diabetes history: DM2 Outpatient Diabetes medications: Lantus 58 units QHS, Humalog 5 units TID, Metformin 1500 mg daily, Invokana 100 mg daily Current orders for Inpatient glycemic control: Novolog 0-9 units TID with meals  Inpatient Diabetes Program Recommendations:  Insulin - Basal: Please consider ordering Lantus 13 units daily starting now (based on 126.6 kg x 0.1 units). Insulin - Meal Coverage: If post prandial glucose remains consistently greater than >180 mg/dl after Lantus is added, please consider ordering Novolog 3 units TID with meals for meal coverage if patient eats at least 50% of meals. Insulin-Correction: Please consider ordering Novolog 0-5 units QHS for bedtime correction.  Thanks, Cody Penner, Cody Hale, Cody Hale, Cody Hale Diabetes Coordinator Inpatient Diabetes Program [phone] (Team Pager from 8am to 5pm)
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
## 2017-02-13 NOTE — Care Management Important Message (Signed)
Important Message  Patient Details  Name: Cody Hale MRN: [id number] Date of Birth: Jan 05, 1951   Medicare Important Message Given:  Yes    Malcolm Metro, RN 02/13/2017, 11:24 AM

## 2017-02-13 NOTE — Progress Notes (Signed)
Discharge instructions given to patient and caregiver, both verbalize understanding. All questions addressed and answered

## 2017-02-13 NOTE — Care Management Note (Signed)
Case Management Note  Patient Details  Name: Cody Hale MRN: [id number] Date of Birth: 09-24-1950  Subjective/Objective:                  Pt admitted with sepsis. He is from [facility]. He plans to return to [facility]. He has no HH services pta. He was getting OP PT in [place]. He uses cane with ambulation, has no new needs. Pt plans to resume OP PT services after DC.   Action/Plan: Plan for return to ALF. CM has contacted [facility] to confirm next appointment 02/18/2017 at 1115 AM. CSW will make arrangements for return to facility.   Expected Discharge Date:      02/13/2017            Expected Discharge Plan:  Assisted Living / Rest Home  In-House Referral:  Clinical Social Work  Discharge planning Services  CM Consult  Post Acute Care Choice:  NA Choice offered to:  NA  Status of Service:  Completed, signed off  Malcolm Metro, RN 02/13/2017, 11:18 AM

## 2017-02-13 NOTE — Telephone Encounter (Signed)
02/13/17  Lupita Leash with [facility] called to cx she said that he was still in the hospital

## 2017-02-13 NOTE — Clinical Social Work Note (Signed)
LCSW notified Lupita Leash at [name] of patient's discharge and left a message for patient's son regarding discharge. Facility to pick patient up. Clinicals faxed to facility.   LCSW signing off.       Rhonna Holster, Juleen China, LCSW

## 2017-02-13 NOTE — NC FL2 (Signed)
MEDICAID FL2 LEVEL OF CARE SCREENING TOOL     IDENTIFICATION  Patient Name: Cody Hale Birthdate: 06/15/1951 Sex: male Admission Date (Current Location): 02/10/2017  [facility] and [place] Number:  [employer] and Address:  [facility],  [address] [place] [address]      Provider Number: [id number]  Attending Physician Name and Address:  Philip Aspen, Minerva Ends*  Relative Name and Phone Number:       Current Level of Care:   Recommended Level of Care: Assisted Living Facility Prior Approval Number:    Date Approved/Denied:   PASRR Number:    Discharge Plan: Other (Comment) ([facility])    Current Diagnoses: Patient Active Problem List   Diagnosis Date Noted  . Sepsis (HCC) 02/10/2017  . Generalized weakness   . Chronic pain of right knee 09/18/2016  . Unilateral primary osteoarthritis, right knee 09/18/2016  . Seasonal allergic rhinitis due to pollen 03/13/2016  . Acute renal failure (HCC) 01/19/2015  . Chronic systolic heart failure (HCC) 01/11/2015  . Chronic atrial fibrillation (HCC) 01/11/2015  . S/P mitral valve replacement with bioprosthetic valve 01/11/2015  . Depression with somatization 11/22/2014  . Primary osteoarthritis of both knees 11/22/2014  . COPD (chronic obstructive pulmonary disease) with chronic bronchitis (HCC) 05/12/2014  . B12 deficiency anemia 02/03/2014  . Routine general medical examination at a health care facility 03/21/2013  . Hyperlipidemia with target LDL less than 70 03/19/2013  . Essential hypertension, malignant 12/01/2012  . Type II diabetes mellitus with manifestations (HCC)   . Gastroparesis 11/30/2012  . OSA (obstructive sleep apnea) 11/16/2012  . Endocarditis-resolved 08/26/2012  . LBBB (left bundle branch block) 07/03/2012  . Morbid obesity (HCC) 06/27/2012    Orientation RESPIRATION BLADDER Height & Weight     Self, Time, Situation, Place  Normal Continent Weight: 279 lb  1.6 oz (126.6 kg) Height:  5\' 9"  (175.3 cm)  BEHAVIORAL SYMPTOMS/MOOD NEUROLOGICAL BOWEL NUTRITION STATUS      Continent Diet (Low sodium, heart healthy, carb modified. )  AMBULATORY STATUS COMMUNICATION OF NEEDS Skin   Limited Assist   Normal                       Personal Care Assistance Level of Assistance  Bathing, Feeding, Dressing Bathing Assistance: Limited assistance Feeding assistance: Independent Dressing Assistance: Limited assistance     Functional Limitations Info  Sight, Hearing, Speech Sight Info: Adequate Hearing Info: Adequate Speech Info: Adequate    SPECIAL CARE FACTORS FREQUENCY                       Contractures Contractures Info: Not present    Additional Factors Info  Code Status, Allergies Code Status Info: Full Code Allergies Info: Daptomycin, Lisinopril, Tape           Current Medications (02/13/2017):  This is the current hospital active medication list Current Facility-Administered Medications  Medication Dose Route Frequency Provider Last Rate Last Dose  . acetaminophen (TYLENOL) tablet 650 mg  650 mg Oral Q6H PRN Haydee Monica, MD   650 mg at 02/13/17 0305   Or  . acetaminophen (TYLENOL) suppository 650 mg  650 mg Rectal Q6H PRN Haydee Monica, MD      . amoxicillin-clavulanate (AUGMENTIN) 875-125 MG per tablet 1 tablet  1 tablet Oral Q12H Vickki Hearing, MD   1 tablet at 02/13/17 1033  . insulin aspart (novoLOG) injection 0-9 Units  0-9 Units Subcutaneous TID WC Haydee Monica, MD   5 Units at 02/13/17 1139  . oxyCODONE (Oxy IR/ROXICODONE) immediate release tablet 5 mg  5 mg Oral Q6H PRN Haydee Monica, MD   5 mg at 02/13/17 0803  . rivaroxaban (XARELTO) tablet 20 mg  20 mg Oral Daily Philip Aspen, Limmie Patricia, MD   20 mg at 02/13/17 1033     Discharge Medications: TAKE these medications   amoxicillin-clavulanate 875-125 MG tablet Commonly known as:  AUGMENTIN Take 1 tablet by mouth every 12 (twelve) hours.    canagliflozin 100 MG Tabs tablet Commonly known as:  INVOKANA Take 1 tablet (100 mg total) by mouth daily before breakfast.   carvedilol 6.25 MG tablet Commonly known as:  COREG TAKE 1 TABLET BY MOUTH TWICE DAILY.   DAILY VITE Tabs TAKE 1 TABLET BY MOUTH ONCE DAILY.   fluticasone 50 MCG/ACT nasal spray Commonly known as:  FLONASE Place 2 sprays into both nostrils daily.   Insulin Glargine 100 UNIT/ML Solostar Pen Commonly known as:  LANTUS SOLOSTAR Inject 55 Units into the skin daily at 10 pm. What changed:  how much to take   insulin lispro 100 UNIT/ML KiwkPen Commonly known as:  HUMALOG Inject 0.05 mLs (5 Units total) into the skin 3 (three) times daily.   levocetirizine 5 MG tablet Commonly known as:  XYZAL TAKE 1 TABLET BY MOUTH EACH EVENING.   metFORMIN 750 MG 24 hr tablet Commonly known as:  GLUCOPHAGE-XR TAKE 2 TABLETS BY MOUTH EACH MORNING WITH BREAKFAST.   metolazone 2.5 MG tablet Commonly known as:  ZAROXOLYN TAKE ONE TABLET BY MOUTH EVERY OTHER DAY.   nitroGLYCERIN 0.4 MG SL tablet Commonly known as:  NITROSTAT Place 1 tablet (0.4 mg total) under the tongue every 5 (five) minutes x 3 doses as needed for chest pain.   NOVOFINE AUTOCOVER 30G X 8 MM Misc Generic drug:  Insulin Pen Needle USE AS DIRECTED FOR INSULIN ADMINISTRATION.   oxyCODONE 5 MG immediate release tablet Commonly known as:  Oxy IR/ROXICODONE Take 1 tablet (5 mg total) by mouth every 6 (six) hours as needed for severe pain.   simvastatin 20 MG tablet Commonly known as:  ZOCOR TAKE 1 TABLET BY MOUTH ONCE DAILY.   VIIBRYD 40 MG Tabs Generic drug:  Vilazodone HCl TAKE 1 TABLET BY MOUTH ONCE DAILY.   XARELTO 20 MG Tabs tablet Generic drug:  rivaroxaban TAKE 1 TABLET BY MOUTH ONCE DAILY.        Relevant Imaging Results:  Relevant Lab Results:   Additional Information    Niya Behler, Juleen China, LCSW

## 2017-02-13 NOTE — Discharge Summary (Signed)
Physician Discharge Summary  Cody Hale ZOX:[id number] DOB: [birth year] DOA: 02/10/2017  PCP: [name], MD  Admit date: 02/10/2017 Discharge date: 02/13/2017  Time spent: 45 minutes  Recommendations for Outpatient Follow-up:  -To be discharged back to assisted living facility today. -Of note, torsemide and ARB have been discontinued due to worsening renal function.   Discharge Diagnoses:  Principal Problem:   Sepsis (HCC) Active Problems:   Morbid obesity (HCC)   LBBB (left bundle branch block)   OSA (obstructive sleep apnea)   Type II diabetes mellitus with manifestations (HCC)   Endocarditis-resolved   COPD (chronic obstructive pulmonary disease) with chronic bronchitis (HCC)   Chronic systolic heart failure (HCC)   Chronic atrial fibrillation (HCC)   S/P mitral valve replacement with bioprosthetic valve   Acute renal failure [facility])   Discharge Condition: Stable and improved  Filed Weights   02/10/17 0231 02/10/17 0604 02/11/17 0500  Weight: 127.9 kg (282 lb) 126.6 kg (279 lb 1.6 oz) 126.6 kg (279 lb 1.6 oz)    History of present illness:  As per Dr. Onalee Hua on 8/6: Cody Hale is a 66 y.o. male with medical history significant of cellulits, chf, dm, htn, MVR on xaralto, afib comes in from his assisted living for one week of fevers chills and not feeling well at all.  Has not been eating or drinking very well.  Has been getting very weak and dizzy when he gets up.  The staff at his assisted living has been trying to get him to come to doctor but he thought the illness would just pass and he would get better.  No n/v/d.  No chest pain, no abd pain , no sob.  No rashes anywhere.   No cough.  No dysuria or urinary symptoms.  No diarrhea.  No recent abx usage or hospitalizations.  Pt was dropped off in the lobby by staff at assisted living.  Pt noted to be ill appearing and sbp was in the 50s.  Given 30cc/kg ivf bolus and sbp is improved.  Pt referred for admission for  septic shock of unclear etiology.  He is feeling some better with ivf and is mentating normally at this time.  Hospital Course:   Septic shock -Etiology remains unclear. -All culture data remains negative to date. -Did not require pressor support. -Doubt endocarditis given negative blood cultures. He has a history of endocarditis and is status post a bioprosthetic mitral valve. -Given he is afebrile, no WBC count, antibiotics were discontinued on 8/8.  Left knee pain and edema -Seen by Dr. Romeo Apple, he believes he has patellar bursitisand has recommended a seven-day course of Augmentin.  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  -Creatinine is down to 1.3 from 7.6 on admission,  with IV fluids.  -His baseline creatinine appears to be around 1.1. -Have held torsemide and ARB for now.  A. fib  -Currently rate controlled,  -Resume xarelto as long as kidney function continues to improve.  -Renal ultrasound shows mild left-sided hydronephrosis, will not investigate further with CT scan given rapid improvement in renal function with IV fluids.    Procedures:  None   Consultations:  Dr. Romeo Apple, orthopedics  Discharge Instructions  Discharge Instructions    Diet - low sodium heart healthy    Complete by:  As directed    Increase activity slowly    Complete by:  As directed  Allergies as of 02/13/2017      Reactions   Daptomycin Rash   Lisinopril Cough   Tape Rash, Other (See Comments)   Adhesive Tape-Burn skin.      Medication List    STOP taking these medications   potassium chloride SA 20 MEQ tablet Commonly known as:  KLOR-CON M20   torsemide 20 MG tablet Commonly known as:  DEMADEX   valsartan 40 MG tablet Commonly known as:  DIOVAN     TAKE these medications   amoxicillin-clavulanate 875-125 MG tablet Commonly known as:  AUGMENTIN Take 1 tablet by mouth every 12 (twelve) hours.     canagliflozin 100 MG Tabs tablet Commonly known as:  INVOKANA Take 1 tablet (100 mg total) by mouth daily before breakfast.   carvedilol 6.25 MG tablet Commonly known as:  COREG TAKE 1 TABLET BY MOUTH TWICE DAILY.   DAILY VITE Tabs TAKE 1 TABLET BY MOUTH ONCE DAILY.   fluticasone 50 MCG/ACT nasal spray Commonly known as:  FLONASE Place 2 sprays into both nostrils daily.   Insulin Glargine 100 UNIT/ML Solostar Pen Commonly known as:  LANTUS SOLOSTAR Inject 55 Units into the skin daily at 10 pm. What changed:  how much to take   insulin lispro 100 UNIT/ML KiwkPen Commonly known as:  HUMALOG Inject 0.05 mLs (5 Units total) into the skin 3 (three) times daily.   levocetirizine 5 MG tablet Commonly known as:  XYZAL TAKE 1 TABLET BY MOUTH EACH EVENING.   metFORMIN 750 MG 24 hr tablet Commonly known as:  GLUCOPHAGE-XR TAKE 2 TABLETS BY MOUTH EACH MORNING WITH BREAKFAST.   metolazone 2.5 MG tablet Commonly known as:  ZAROXOLYN TAKE ONE TABLET BY MOUTH EVERY OTHER DAY.   nitroGLYCERIN 0.4 MG SL tablet Commonly known as:  NITROSTAT Place 1 tablet (0.4 mg total) under the tongue every 5 (five) minutes x 3 doses as needed for chest pain.   NOVOFINE AUTOCOVER 30G X 8 MM Misc Generic drug:  Insulin Pen Needle USE AS DIRECTED FOR INSULIN ADMINISTRATION.   oxyCODONE 5 MG immediate release tablet Commonly known as:  Oxy IR/ROXICODONE Take 1 tablet (5 mg total) by mouth every 6 (six) hours as needed for severe pain.   simvastatin 20 MG tablet Commonly known as:  ZOCOR TAKE 1 TABLET BY MOUTH ONCE DAILY.   VIIBRYD 40 MG Tabs Generic drug:  Vilazodone HCl TAKE 1 TABLET BY MOUTH ONCE DAILY.   XARELTO 20 MG Tabs tablet Generic drug:  rivaroxaban TAKE 1 TABLET BY MOUTH ONCE DAILY.        Allergies  Allergen Reactions  . Daptomycin Rash  . Lisinopril Cough  . Tape Rash and Other (See Comments)    Adhesive Tape-Burn skin.   Follow-up Information    [name], MD.  Schedule an appointment as soon as possible for a visit in 2 week(s).   Specialty:  Internal Medicine Contact information: [address] [place] [address] [phone]            The results of significant diagnostics from this hospitalization (including imaging, microbiology, ancillary and laboratory) are listed below for reference.    Significant Diagnostic Studies: Dg Knee 1-2 Views Left  Result Date: 02/12/2017 CLINICAL DATA:  Left knee pain and calor. EXAM: LEFT KNEE - 1-2 VIEW COMPARISON:  None. FINDINGS: Patellofemoral and medial femorotibial joint space narrowing and spurring consistent with osteoarthritis. No significant joint effusion. No fracture, dislocation, intra-articular loose body nor bone destruction. No  significant soft tissue findings. Atherosclerosis of the femoral artery. IMPRESSION: 1. 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 2. No acute fracture nor bone destruction. 3. No joint effusion. Electronically Signed   By: Tollie Eth M.D.   On: 02/12/2017 21:40   US Renal  Result Date: 02/10/2017 CLINICAL DATA:  66 year old male with history of sepsis in urinary tract infection with acute kidney injury. EXAM: RENAL / URINARY TRACT ULTRASOUND COMPLETE COMPARISON:  No priors. FINDINGS: Right Kidney: Length: 12.4 cm. Echogenicity within normal limits. In the upper pole there is a well-defined 2.2 x 2.3 x 2.2 cm anechoic lesion with increased through transmission, compatible with a simple cyst. No hydronephrosis visualized. Left Kidney: Length: 12.5 cm. Echogenicity within normal limits. No mass visualized. Mild hydronephrosis. Bladder: Appears normal for degree of bladder distention. Urinary jets could not be visualized. IMPRESSION: 1. Mild left-sided hydronephrosis. If there is clinical concern for obstructive uropathy, further evaluation with noncontrast CT the abdomen and pelvis is suggested. 2. 2.2 x 2.3 x 2.2 cm simple cyst  in the upper pole of the right kidney. Electronically Signed   By: Trudie Reed M.D.   On: 02/10/2017 11:59   Dg Chest Port 1 View  Result Date: 02/10/2017 CLINICAL DATA:  Initial evaluation for acute weakness. EXAM: PORTABLE CHEST 1 VIEW COMPARISON:  Prior radiograph from 05/20/2016. FINDINGS: Median sternotomy wires noted. Stable cardiomegaly. Mediastinal silhouette within normal limits. Lungs normally inflated. No focal infiltrates. No pulmonary edema or pleural effusion. No pneumothorax. No acute osseus abnormality. IMPRESSION: 1. No active cardiopulmonary disease. 2. Stable cardiomegaly without pulmonary edema. Electronically Signed   By: Rise Mu M.D.   On: 02/10/2017 02:55   Xr Pelvis 1-2 Views  Result Date: 01/20/2017 An AP pelvis shows severe end-stage arthritis on his right hip and mild arthritis on the left hip. There is otherwise no acute findings.   Microbiology: Recent Results (from the past 240 hour(s))  Urine culture     Status: None   Collection Time: 02/10/17  2:36 AM  Result Value Ref Range Status   Specimen Description URINE, CLEAN CATCH  Final   Special Requests NONE  Final   Culture   Final    NO GROWTH Performed at [facility], [address], [place], [place] [address]    Report Status 02/11/2017 FINAL  Final  Blood Culture (routine x 2)     Status: None (Preliminary result)   Collection Time: 02/10/17  3:00 AM  Result Value Ref Range Status   Specimen Description RIGHT ANTECUBITAL  Final   Special Requests   Final    BOTTLES DRAWN AEROBIC AND ANAEROBIC Blood Culture adequate volume   Culture NO GROWTH 3 DAYS  Final   Report Status PENDING  Incomplete  Blood Culture (routine x 2)     Status: None (Preliminary result)   Collection Time: 02/10/17  3:06 AM  Result Value Ref Range Status   Specimen Description BLOOD RIGHT HAND  Final   Special Requests   Final    BOTTLES DRAWN AEROBIC AND ANAEROBIC Blood Culture adequate volume   Culture  NO GROWTH 3 DAYS  Final   Report Status PENDING  Incomplete  MRSA PCR Screening     Status: None   Collection Time: 02/10/17  5:54 AM  Result Value Ref Range Status   MRSA by PCR NEGATIVE NEGATIVE Final    Comment:        The GeneXpert MRSA Assay (FDA approved for NASAL specimens  only), is one component of a comprehensive MRSA colonization surveillance program. It is not intended to diagnose MRSA infection nor to guide or monitor treatment for MRSA infections.      Labs: Basic Metabolic Panel:  Recent Labs Lab 02/10/17 0236 02/10/17 0956 02/11/17 0531 02/12/17 0548 02/13/17 0409  NA 139 138 144 143 140  K 5.1 4.7 5.0 4.0 3.8  CL 93* 95* 100* 100* 99*  CO2 30 30 33* 31 31  GLUCOSE 149* 172* 209* 261* 232*  BUN 130* 116* 87* 57* 33*  CREATININE 7.60* 6.36* 3.28* 1.98* 1.36*  CALCIUM 9.8 8.9 9.7 9.8 9.8   Liver Function Tests:  Recent Labs Lab 02/10/17 0236 02/11/17 0531  AST 23 16  ALT 20 19  ALKPHOS 60 55  BILITOT 1.0 0.8  PROT 7.2 7.0  ALBUMIN 3.9 3.7   No results for input(s): LIPASE, AMYLASE in the last 168 hours. No results for input(s): AMMONIA in the last 168 hours. CBC:  Recent Labs Lab 02/10/17 0236 02/11/17 0531 02/13/17 0409  WBC 7.1 4.9 6.8  NEUTROABS 5.3  --   --   HGB 12.8* 12.8* 12.9*  HCT 38.8* 39.7 40.0  MCV 94.2 97.1 96.6  PLT 140* 117* 118*   Cardiac Enzymes:  Recent Labs Lab 02/10/17 0236 02/10/17 0644 02/10/17 0956 02/10/17 1902  TROPONINI 0.04* 0.03* 0.03* 0.03*   BNP: BNP (last 3 results)  Recent Labs  05/04/16 2121 05/20/16 0142  BNP 96.0 144.0*    ProBNP (last 3 results)  Recent Labs  12/31/16 1149  PROBNP 87.0    CBG:  Recent Labs Lab 02/12/17 1118 02/12/17 1707 02/12/17 2120 02/13/17 0753 02/13/17 1109  GLUCAP 236* 201* 254* 244* 267*       Signed:  HERNANDEZ ACOSTA,ESTELA  [facility] Pager: [phone] 02/13/2017, 1:14 PM

## 2017-02-13 NOTE — Telephone Encounter (Signed)
Confrimed apptment with Jessica at [facility] 02/13/17. NF

## 2017-02-14 ENCOUNTER — Telehealth: Payer: Self-pay | Admitting: *Deleted

## 2017-02-14 NOTE — Telephone Encounter (Signed)
Called pt to set-up TCM hosp appt pt is in a assisted living facility spoke w/ nurse Lupita Leash. Completed TCM call below.../lmb  [facility] Follow-up Telephone Call   Date discharged? 02/13/17   How have you been since you were released from the hospital? Per donna pt is doing alright. Just have a lot therapy appt's    Do you understand why you were in the hospital? YES   Do you understand the discharge instructions? YES   Where were you discharged to? Assisted Living Facility   Items Reviewed:  Medications reviewed: YES  Allergies reviewed: YES  Dietary changes reviewed: YES  Referrals reviewed: YES, will be seeing his neurologist 02/26/17   Functional Questionnaire:   Activities of Daily Living (ADLs):   She states he are independent in the following: bathing and hygiene, feeding, grooming, toileting and dressing States he require assistance with the following: ambulation, bathing and hygiene and continence   Any transportation issues/concerns?: NO   Any patient concerns? NO   Confirmed importance and date/time of follow-up visits scheduled YES, appt 02/26/17  Provider Appointment booked with Dr. Yetta Barre  Confirmed with patient if condition begins to worsen call PCP or go to the ER.  Patient was given the office number and encouraged to call back with question or concerns.  : YES

## 2017-02-15 LAB — CULTURE, BLOOD (ROUTINE X 2)
Culture: NO GROWTH
Culture: NO GROWTH
Special Requests: ADEQUATE
Special Requests: ADEQUATE

## 2017-02-18 ENCOUNTER — Encounter (HOSPITAL_COMMUNITY): Payer: Self-pay

## 2017-02-18 ENCOUNTER — Ambulatory Visit (HOSPITAL_COMMUNITY): Payer: Medicare Other

## 2017-02-18 DIAGNOSIS — M6281 Muscle weakness (generalized): Secondary | ICD-10-CM

## 2017-02-18 DIAGNOSIS — R262 Difficulty in walking, not elsewhere classified: Secondary | ICD-10-CM

## 2017-02-18 DIAGNOSIS — M25552 Pain in left hip: Secondary | ICD-10-CM

## 2017-02-18 DIAGNOSIS — R2689 Other abnormalities of gait and mobility: Secondary | ICD-10-CM | POA: Diagnosis not present

## 2017-02-18 DIAGNOSIS — R29898 Other symptoms and signs involving the musculoskeletal system: Secondary | ICD-10-CM | POA: Diagnosis not present

## 2017-02-18 NOTE — Patient Instructions (Signed)
  Split Stance Hip Excursions  1. Tilt at the hips  2. Keep back flat, engage core  3. Keep both feet pointed straight ahead  4. Work on hip mobility   A. bend forwards and backwards  B. Tilt side to side (be sure to tilt at the hips, not at the shoulders)  C. turn each way
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
## 2017-02-20 ENCOUNTER — Ambulatory Visit (HOSPITAL_COMMUNITY): Payer: Medicare Other | Admitting: Physical Therapy

## 2017-02-20 ENCOUNTER — Telehealth (HOSPITAL_COMMUNITY): Payer: Self-pay | Admitting: Internal Medicine

## 2017-02-20 NOTE — Telephone Encounter (Signed)
02/20/17 Cody Hale with [facility] called and said that we didn't send a letter to [facility] to let them know he had an appt today.  I told her we sent the letter for the appointment on Tuesday but she said that she called and asked [place] to send one for the 8/16 appt but we didn't.  She said here on out the patient would be here.

## 2017-02-24 ENCOUNTER — Ambulatory Visit (HOSPITAL_COMMUNITY): Payer: Medicare Other

## 2017-02-24 ENCOUNTER — Encounter (HOSPITAL_COMMUNITY): Payer: Self-pay

## 2017-02-24 DIAGNOSIS — M6281 Muscle weakness (generalized): Secondary | ICD-10-CM

## 2017-02-24 DIAGNOSIS — R2689 Other abnormalities of gait and mobility: Secondary | ICD-10-CM | POA: Diagnosis not present

## 2017-02-24 DIAGNOSIS — R29898 Other symptoms and signs involving the musculoskeletal system: Secondary | ICD-10-CM | POA: Diagnosis not present

## 2017-02-24 DIAGNOSIS — R262 Difficulty in walking, not elsewhere classified: Secondary | ICD-10-CM

## 2017-02-24 DIAGNOSIS — M25552 Pain in left hip: Secondary | ICD-10-CM | POA: Diagnosis not present
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
## 2017-02-26 ENCOUNTER — Other Ambulatory Visit (INDEPENDENT_AMBULATORY_CARE_PROVIDER_SITE_OTHER): Payer: Medicare Other

## 2017-02-26 ENCOUNTER — Ambulatory Visit (INDEPENDENT_AMBULATORY_CARE_PROVIDER_SITE_OTHER)
Admission: RE | Admit: 2017-02-26 | Discharge: 2017-02-26 | Disposition: A | Discharge status: Home / Self Care | Payer: Medicare Other | Source: Ambulatory Visit | Attending: Internal Medicine | Admitting: Internal Medicine

## 2017-02-26 ENCOUNTER — Encounter: Payer: Self-pay | Admitting: Internal Medicine

## 2017-02-26 ENCOUNTER — Ambulatory Visit (INDEPENDENT_AMBULATORY_CARE_PROVIDER_SITE_OTHER): Payer: Medicare Other | Admitting: Internal Medicine

## 2017-02-26 VITALS — BP 148/80 | HR 67 | Temp 98.5°F | Resp 16 | Ht 69.0 in | Wt 277.2 lb

## 2017-02-26 DIAGNOSIS — M7989 Other specified soft tissue disorders: Secondary | ICD-10-CM | POA: Diagnosis not present

## 2017-02-26 DIAGNOSIS — M79672 Pain in left foot: Secondary | ICD-10-CM

## 2017-02-26 DIAGNOSIS — Z794 Long term (current) use of insulin: Secondary | ICD-10-CM

## 2017-02-26 DIAGNOSIS — M109 Gout, unspecified: Secondary | ICD-10-CM

## 2017-02-26 DIAGNOSIS — E118 Type 2 diabetes mellitus with unspecified complications: Secondary | ICD-10-CM

## 2017-02-26 DIAGNOSIS — I10 Essential (primary) hypertension: Secondary | ICD-10-CM

## 2017-02-26 DIAGNOSIS — Z23 Encounter for immunization: Secondary | ICD-10-CM | POA: Diagnosis not present

## 2017-02-26 LAB — BASIC METABOLIC PANEL
BUN: 17 mg/dL (ref 6–23)
CO2: 37 mEq/L — ABNORMAL HIGH (ref 19–32)
Calcium: 10 mg/dL (ref 8.4–10.5)
Chloride: 96 mEq/L (ref 96–112)
Creatinine, Ser: 1.45 mg/dL (ref 0.40–1.50)
GFR: 51.7 mL/min — ABNORMAL LOW (ref >60.00)
Glucose, Bld: 179 mg/dL — ABNORMAL HIGH (ref 70–99)
Potassium: 3.7 mEq/L (ref 3.5–5.1)
Sodium: 141 mEq/L (ref 135–145)

## 2017-02-26 LAB — CBC WITH DIFFERENTIAL/PLATELET
Basophils Absolute: 0.1 10*3/uL (ref 0.0–0.1)
Basophils Relative: 1.2 % (ref 0.0–3.0)
Eosinophils Absolute: 0.1 10*3/uL (ref 0.0–0.7)
Eosinophils Relative: 1.6 % (ref 0.0–5.0)
HCT: 39.2 % (ref 39.0–52.0)
Hemoglobin: 12.8 g/dL — ABNORMAL LOW (ref 13.0–17.0)
Lymphocytes Relative: 11.1 % — ABNORMAL LOW (ref 12.0–46.0)
Lymphs Abs: 0.8 10*3/uL (ref 0.7–4.0)
MCHC: 32.6 g/dL (ref 30.0–36.0)
MCV: 94.1 fl (ref 78.0–100.0)
Monocytes Absolute: 0.9 10*3/uL (ref 0.1–1.0)
Monocytes Relative: 12.6 % — ABNORMAL HIGH (ref 3.0–12.0)
Neutro Abs: 5.2 10*3/uL (ref 1.4–7.7)
Neutrophils Relative %: 73.5 % (ref 43.0–77.0)
Platelets: 224 10*3/uL (ref 150.0–400.0)
RBC: 4.17 Mil/uL — ABNORMAL LOW (ref 4.22–5.81)
RDW: 13.8 % (ref 11.5–15.5)
WBC: 7.1 10*3/uL (ref 4.0–10.5)

## 2017-02-26 LAB — URIC ACID: Uric Acid, Serum: 8.7 mg/dL — ABNORMAL HIGH (ref 4.0–7.8)

## 2017-02-26 LAB — SEDIMENTATION RATE: Sed Rate: 52 mm/hr — ABNORMAL HIGH (ref 0–20)

## 2017-02-26 MED ORDER — COLCHICINE 0.6 MG PO TABS: 0.6000 mg | ORAL_TABLET | Freq: Two times a day (BID) | ORAL | 2 refills | Status: DC

## 2017-02-26 MED ORDER — METHYLPREDNISOLONE ACETATE 80 MG/ML IJ SUSP
120.0000 mg | Freq: Once | INTRAMUSCULAR | Status: AC
  Administered 2017-02-26: 120 mg via INTRAMUSCULAR

## 2017-02-26 NOTE — Patient Instructions (Signed)

## 2017-02-26 NOTE — Progress Notes (Signed)
Subjective:  Patient ID: Cody Hale, male    DOB: 08/06/1950  Age: 66 y.o. MRN: [id number]  CC: Foot Pain; Hypertension; and Diabetes   HPI Cody Hale presents for f/up after a recent admission for sepsis with no known source. He has completed the course of augmentin. He complains of non-traumatic pain, redness, swelling over his left lateral mid-foot for 3 days.  Outpatient Medications Prior to Visit  Medication Sig Dispense Refill  . canagliflozin (INVOKANA) 100 MG TABS tablet Take 1 tablet (100 mg total) by mouth daily before breakfast. 90 tablet 1  . carvedilol (COREG) 6.25 MG tablet TAKE 1 TABLET BY MOUTH TWICE DAILY. 60 tablet 11  . fluticasone (FLONASE) 50 MCG/ACT nasal spray Place 2 sprays into both nostrils daily. 16 g 11  . Insulin Glargine (LANTUS SOLOSTAR) 100 UNIT/ML Solostar Pen Inject 55 Units into the skin daily at 10 pm. (Patient taking differently: Inject 58 Units into the skin daily at 10 pm. ) 15 mL 3  . insulin lispro (HUMALOG) 100 UNIT/ML KiwkPen Inject 0.05 mLs (5 Units total) into the skin 3 (three) times daily. 15 mL 11  . levocetirizine (XYZAL) 5 MG tablet TAKE 1 TABLET BY MOUTH EACH EVENING. 30 tablet 5  . metFORMIN (GLUCOPHAGE-XR) 750 MG 24 hr tablet TAKE 2 TABLETS BY MOUTH EACH MORNING WITH BREAKFAST. 60 tablet 5  . metolazone (ZAROXOLYN) 2.5 MG tablet TAKE ONE TABLET BY MOUTH EVERY OTHER DAY. 15 tablet 3  . Multiple Vitamin (DAILY VITE) TABS TAKE 1 TABLET BY MOUTH ONCE DAILY. 30 each 11  . NOVOFINE AUTOCOVER 30G X 8 MM MISC USE AS DIRECTED FOR INSULIN ADMINISTRATION. 100 each 11  . oxyCODONE (OXY IR/ROXICODONE) 5 MG immediate release tablet Take 1 tablet (5 mg total) by mouth every 6 (six) hours as needed for severe pain. 75 tablet 0  . simvastatin (ZOCOR) 20 MG tablet TAKE 1 TABLET BY MOUTH ONCE DAILY. 30 tablet 5  . VIIBRYD 40 MG TABS TAKE 1 TABLET BY MOUTH ONCE DAILY. 30 tablet 11  . XARELTO 20 MG TABS tablet TAKE 1 TABLET BY MOUTH ONCE DAILY. 30  tablet 5  . nitroGLYCERIN (NITROSTAT) 0.4 MG SL tablet Place 1 tablet (0.4 mg total) under the tongue every 5 (five) minutes x 3 doses as needed for chest pain. (Patient not taking: Reported on 02/26/2017) 25 tablet 3  . amoxicillin-clavulanate (AUGMENTIN) 875-125 MG tablet Take 1 tablet by mouth every 12 (twelve) hours. 14 tablet 0   No facility-administered medications prior to visit.     ROS Review of Systems  Constitutional: Negative.  Negative for chills, fatigue and fever.  HENT: Negative.   Eyes: Negative.   Respiratory: Negative.  Negative for cough, chest tightness, shortness of breath and wheezing.   Cardiovascular: Negative for chest pain, palpitations and leg swelling.  Gastrointestinal: Negative for abdominal pain, constipation, diarrhea, nausea and vomiting.  Endocrine: Negative.   Genitourinary: Negative.   Musculoskeletal: Positive for arthralgias. Negative for back pain, myalgias and neck pain.  Skin: Positive for color change. Negative for rash.  Allergic/Immunologic: Negative.   Neurological: Negative.  Negative for dizziness.  Hematological: Negative for adenopathy. Does not bruise/bleed easily.  Psychiatric/Behavioral: Negative.     Objective:  BP (!) 148/80 (BP Location: Left Arm, Patient Position: Sitting, Cuff Size: Normal)   Pulse 67   Temp 98.5 F (36.9 C) (Oral)   Resp 16   Ht 5\' 9"  (1.753 m)   Wt 277 lb 4 oz (125.8 kg)  SpO2 95%   BMI 40.94 kg/m   BP Readings from Last 3 Encounters:  02/26/17 (!) 148/80  02/13/17 (!) 147/63  01/21/17 130/60    Wt Readings from Last 3 Encounters:  02/26/17 277 lb 4 oz (125.8 kg)  02/11/17 279 lb 1.6 oz (126.6 kg)  01/21/17 286 lb (129.7 kg)    Physical Exam  Constitutional: He is oriented to person, place, and time. No distress.  HENT:  Mouth/Throat: Oropharynx is clear and moist. No oropharyngeal exudate.  Eyes: Conjunctivae are normal. Right eye exhibits no discharge. Left eye exhibits no discharge. No  scleral icterus.  Neck: Normal range of motion. Neck supple. No JVD present. No thyromegaly present.  Cardiovascular: Normal rate, regular rhythm and intact distal pulses.  Exam reveals no gallop and no friction rub.   Murmur heard. Pulmonary/Chest: Effort normal and breath sounds normal. No respiratory distress. He has no wheezes. He has no rales. He exhibits no tenderness.  Abdominal: Soft. Bowel sounds are normal. He exhibits no distension and no mass. There is no tenderness. There is no rebound and no guarding.  Musculoskeletal: Normal range of motion. He exhibits no edema or deformity.       Left foot: There is tenderness and bony tenderness. There is normal range of motion, no swelling, no deformity and no laceration.       Feet:  Lymphadenopathy:    He has no cervical adenopathy.  Neurological: He is alert and oriented to person, place, and time.  Skin: Skin is warm and dry. No abrasion and no rash noted. Rash is not pustular and not vesicular. He is not diaphoretic. There is erythema. No pallor.  Vitals reviewed.   Lab Results  Component Value Date   WBC 7.1 02/26/2017   HGB 12.8 (L) 02/26/2017   HCT 39.2 02/26/2017   PLT 224.0 02/26/2017   GLUCOSE 179 (H) 02/26/2017   CHOL 102 03/13/2016   TRIG 108.0 03/13/2016   HDL 46.80 03/13/2016   LDLDIRECT 84.2 01/31/2014   LDLCALC 34 03/13/2016   ALT 19 02/11/2017   AST 16 02/11/2017   NA 141 02/26/2017   K 3.7 02/26/2017   CL 96 02/26/2017   CREATININE 1.45 02/26/2017   BUN 17 02/26/2017   CO2 37 (H) 02/26/2017   TSH 0.80 12/31/2016   PSA 0.36 05/30/2014   INR 2.61 02/10/2017   HGBA1C 7.3 12/31/2016   MICROALBUR 19.9 (H) 07/11/2016    US Renal  Result Date: 02/10/2017 CLINICAL DATA:  66 year old male with history of sepsis in urinary tract infection with acute kidney injury. EXAM: RENAL / URINARY TRACT ULTRASOUND COMPLETE COMPARISON:  No priors. FINDINGS: Right Kidney: Length: 12.4 cm. Echogenicity within normal limits.  In the upper pole there is a well-defined 2.2 x 2.3 x 2.2 cm anechoic lesion with increased through transmission, compatible with a simple cyst. No hydronephrosis visualized. Left Kidney: Length: 12.5 cm. Echogenicity within normal limits. No mass visualized. Mild hydronephrosis. Bladder: Appears normal for degree of bladder distention. Urinary jets could not be visualized. IMPRESSION: 1. Mild left-sided hydronephrosis. If there is clinical concern for obstructive uropathy, further evaluation with noncontrast CT the abdomen and pelvis is suggested. 2. 2.2 x 2.3 x 2.2 cm simple cyst in the upper pole of the right kidney. Electronically Signed   By: Trudie Reed M.D.   On: 02/10/2017 11:59   Dg Chest Port 1 View  Result Date: 02/10/2017 CLINICAL DATA:  Initial evaluation for acute weakness. EXAM: PORTABLE CHEST 1 VIEW COMPARISON:  Prior radiograph from 05/20/2016. FINDINGS: Median sternotomy wires noted. Stable cardiomegaly. Mediastinal silhouette within normal limits. Lungs normally inflated. No focal infiltrates. No pulmonary edema or pleural effusion. No pneumothorax. No acute osseus abnormality. IMPRESSION: 1. No active cardiopulmonary disease. 2. Stable cardiomegaly without pulmonary edema. Electronically Signed   By: Rise Mu M.D.   On: 02/10/2017 02:55   Dg Knee 1-2 Views Left  Result Date: 02/12/2017 CLINICAL DATA:  Left knee pain and calor. EXAM: LEFT KNEE - 1-2 VIEW COMPARISON:  None. FINDINGS: Patellofemoral and medial femorotibial joint space narrowing and spurring consistent with osteoarthritis. No significant joint effusion. No fracture, dislocation, intra-articular loose body nor bone destruction. No significant soft tissue findings. Atherosclerosis of the femoral artery. IMPRESSION: 1. 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 2. No acute fracture nor bone destruction. 3. No joint effusion. Electronically Signed   By: Tollie Eth M.D.   On:  02/12/2017 21:40   US Renal  Result Date: 02/10/2017 CLINICAL DATA:  66 year old male with history of sepsis in urinary tract infection with acute kidney injury. EXAM: RENAL / URINARY TRACT ULTRASOUND COMPLETE COMPARISON:  No priors. FINDINGS: Right Kidney: Length: 12.4 cm. Echogenicity within normal limits. In the upper pole there is a well-defined 2.2 x 2.3 x 2.2 cm anechoic lesion with increased through transmission, compatible with a simple cyst. No hydronephrosis visualized. Left Kidney: Length: 12.5 cm. Echogenicity within normal limits. No mass visualized. Mild hydronephrosis. Bladder: Appears normal for degree of bladder distention. Urinary jets could not be visualized. IMPRESSION: 1. Mild left-sided hydronephrosis. If there is clinical concern for obstructive uropathy, further evaluation with noncontrast CT the abdomen and pelvis is suggested. 2. 2.2 x 2.3 x 2.2 cm simple cyst in the upper pole of the right kidney. Electronically Signed   By: Trudie Reed M.D.   On: 02/10/2017 11:59   Dg Chest Port 1 View  Result Date: 02/10/2017 CLINICAL DATA:  Initial evaluation for acute weakness. EXAM: PORTABLE CHEST 1 VIEW COMPARISON:  Prior radiograph from 05/20/2016. FINDINGS: Median sternotomy wires noted. Stable cardiomegaly. Mediastinal silhouette within normal limits. Lungs normally inflated. No focal infiltrates. No pulmonary edema or pleural effusion. No pneumothorax. No acute osseus abnormality. IMPRESSION: 1. No active cardiopulmonary disease. 2. Stable cardiomegaly without pulmonary edema. Electronically Signed   By: Rise Mu M.D.   On: 02/10/2017 02:55   Dg Foot Complete Left  Result Date: 02/26/2017 CLINICAL DATA:  Swelling, painful for 4 days.  No known injury. EXAM: LEFT FOOT - COMPLETE 3+ VIEW COMPARISON:  None. FINDINGS: There is no evidence of fracture or dislocation. 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 There is mild osteoarthritis of the  navicular- medial cuneiform joint. There is a plantar calcaneal spur. There is enthesopathic change at the Achilles tendon insertion. There is soft tissue swelling along the dorsal aspect of the foot. IMPRESSION: No acute osseous injury of the left foot. Electronically Signed   By: Elige Ko   On: 02/26/2017 11:26     Assessment & Plan:   Draken was seen today for foot pain, hypertension and diabetes.  Diagnoses and all orders for this visit:  Acute foot pain, left- The plain film is remarkable for soft tissue swelling. He has a normal WBC count and slightly elevated sedimentation rate with an elevated uric acid level. Will treat for acute gouty arthropathy. -     CBC with Differential/Platelet; Future -     Uric acid; Future -     Sedimentation  rate; Future -     DG Foot Complete Left; Future -     methylPREDNISolone acetate (DEPO-MEDROL) injection 120 mg; Inject 1.5 mLs (120 mg total) into the muscle once.  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   I have discontinued Mr. Utsey amoxicillin-clavulanate. I am also having him start on colchicine. Additionally, I am having him maintain his nitroGLYCERIN, fluticasone, carvedilol, VIIBRYD, XARELTO, DAILY VITE, Insulin Glargine, oxyCODONE, NOVOFINE AUTOCOVER, canagliflozin, levocetirizine, metFORMIN, metolazone, simvastatin, and insulin lispro. We administered methylPREDNISolone acetate.  Meds ordered this encounter    Medications  . methylPREDNISolone acetate (DEPO-MEDROL) injection 120 mg  . colchicine 0.6 MG tablet    Sig: Take 1 tablet (0.6 mg total) by mouth 2 (two) times daily.    Dispense:  60 tablet    Refill:  2     Follow-up: Return in about 1 week (around 03/05/2017).  Sanda Linger, MD

## 2017-02-27 ENCOUNTER — Other Ambulatory Visit: Payer: Self-pay | Admitting: Internal Medicine

## 2017-02-27 ENCOUNTER — Ambulatory Visit (HOSPITAL_COMMUNITY): Payer: Medicare Other

## 2017-02-27 ENCOUNTER — Encounter (HOSPITAL_COMMUNITY): Payer: Self-pay

## 2017-02-27 DIAGNOSIS — R29898 Other symptoms and signs involving the musculoskeletal system: Secondary | ICD-10-CM | POA: Diagnosis not present

## 2017-02-27 DIAGNOSIS — R2689 Other abnormalities of gait and mobility: Secondary | ICD-10-CM

## 2017-02-27 DIAGNOSIS — E118 Type 2 diabetes mellitus with unspecified complications: Secondary | ICD-10-CM

## 2017-02-27 DIAGNOSIS — M6281 Muscle weakness (generalized): Secondary | ICD-10-CM

## 2017-02-27 DIAGNOSIS — M25552 Pain in left hip: Secondary | ICD-10-CM | POA: Diagnosis not present

## 2017-02-27 DIAGNOSIS — R262 Difficulty in walking, not elsewhere classified: Secondary | ICD-10-CM | POA: Diagnosis not present

## 2017-02-27 DIAGNOSIS — Z794 Long term (current) use of insulin: Principal | ICD-10-CM

## 2017-02-27 NOTE — Patient Instructions (Addendum)
  BRIDGING  While lying on your back, tighten your lower abdominals, squeeze your buttocks and then raise your buttocks off the floor/bed as creating a "Bridge" with your body. Hold and then lower yourself and repeat.  Perform 1x/day, 2-3 sets of 10-15 reps   Clamshell with theraband  Place theraband above the knee and lie on your side. Spread legs and make sure not to roll backwards. *Its all about form...can lie against wall or sofa to prevent rolling*  Perform 1x/day, 2-3 sets of 10-15 reps    Thomas Test Position Hip Flexor Stretch  Sit on the edge of a bed, grab one knee and lie back onto the bed in the position as shown in the picture.    The leg that is hanging over the edge should be the leg you are stretching.  Hold your opposite knee tight toward your chest to stabilize your spine.   Perform 2x/day 3-5 stretches holding for 30-60 seconds on each side
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
## 2017-03-03 ENCOUNTER — Ambulatory Visit (HOSPITAL_COMMUNITY): Payer: Medicare Other

## 2017-03-03 ENCOUNTER — Encounter (HOSPITAL_COMMUNITY): Payer: Self-pay

## 2017-03-03 DIAGNOSIS — M25552 Pain in left hip: Secondary | ICD-10-CM | POA: Diagnosis not present

## 2017-03-03 DIAGNOSIS — M6281 Muscle weakness (generalized): Secondary | ICD-10-CM

## 2017-03-03 DIAGNOSIS — R2689 Other abnormalities of gait and mobility: Secondary | ICD-10-CM

## 2017-03-03 DIAGNOSIS — R29898 Other symptoms and signs involving the musculoskeletal system: Secondary | ICD-10-CM | POA: Diagnosis not present

## 2017-03-03 DIAGNOSIS — R262 Difficulty in walking, not elsewhere classified: Secondary | ICD-10-CM
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
## 2017-03-06 ENCOUNTER — Telehealth (HOSPITAL_COMMUNITY): Payer: Self-pay

## 2017-03-06 ENCOUNTER — Encounter (HOSPITAL_COMMUNITY): Payer: Self-pay

## 2017-03-06 ENCOUNTER — Ambulatory Visit (HOSPITAL_COMMUNITY): Payer: Medicare Other

## 2017-03-06 DIAGNOSIS — R262 Difficulty in walking, not elsewhere classified: Secondary | ICD-10-CM

## 2017-03-06 DIAGNOSIS — M6281 Muscle weakness (generalized): Secondary | ICD-10-CM | POA: Diagnosis not present

## 2017-03-06 DIAGNOSIS — R29898 Other symptoms and signs involving the musculoskeletal system: Secondary | ICD-10-CM | POA: Diagnosis not present

## 2017-03-06 DIAGNOSIS — M25552 Pain in left hip: Secondary | ICD-10-CM | POA: Diagnosis not present

## 2017-03-06 DIAGNOSIS — R2689 Other abnormalities of gait and mobility: Secondary | ICD-10-CM

## 2017-03-06 NOTE — Telephone Encounter (Signed)
Patient changed appt time °
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
## 2017-03-06 NOTE — Patient Instructions (Signed)
  LOWER TRUNK ROTATIONS - LTR  Lying on your back with your knees bent, gently move your knees side-to-side.  Perform 2x/day 3-5 stretches holding for 30-60 seconds on each side

## 2017-03-11 ENCOUNTER — Encounter (HOSPITAL_COMMUNITY): Payer: Self-pay

## 2017-03-12 ENCOUNTER — Ambulatory Visit (HOSPITAL_COMMUNITY): Payer: Medicare Other | Attending: Orthopaedic Surgery

## 2017-03-12 DIAGNOSIS — M6281 Muscle weakness (generalized): Secondary | ICD-10-CM | POA: Insufficient documentation

## 2017-03-12 DIAGNOSIS — R2689 Other abnormalities of gait and mobility: Secondary | ICD-10-CM | POA: Insufficient documentation

## 2017-03-12 DIAGNOSIS — M25552 Pain in left hip: Secondary | ICD-10-CM | POA: Insufficient documentation

## 2017-03-12 DIAGNOSIS — R29898 Other symptoms and signs involving the musculoskeletal system: Secondary | ICD-10-CM | POA: Diagnosis not present

## 2017-03-12 DIAGNOSIS — R262 Difficulty in walking, not elsewhere classified: Secondary | ICD-10-CM | POA: Diagnosis not present
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
## 2017-03-14 ENCOUNTER — Other Ambulatory Visit: Payer: Self-pay | Admitting: Internal Medicine

## 2017-03-14 DIAGNOSIS — M150 Primary generalized (osteo)arthritis: Principal | ICD-10-CM

## 2017-03-14 DIAGNOSIS — M159 Polyosteoarthritis, unspecified: Secondary | ICD-10-CM

## 2017-03-14 DIAGNOSIS — M8949 Other hypertrophic osteoarthropathy, multiple sites: Secondary | ICD-10-CM

## 2017-03-14 MED ORDER — OXYCODONE HCL 5 MG PO TABS: 5.0000 mg | ORAL_TABLET | Freq: Four times a day (QID) | ORAL | 0 refills | Status: DC | PRN

## 2017-03-14 NOTE — Progress Notes (Signed)
Notified pt rx ready fo pick-up...Raechel Chute

## 2017-03-17 ENCOUNTER — Telehealth (HOSPITAL_COMMUNITY): Payer: Self-pay | Admitting: Internal Medicine

## 2017-03-17 ENCOUNTER — Ambulatory Visit (HOSPITAL_COMMUNITY): Payer: Medicare Other

## 2017-03-17 DIAGNOSIS — R2689 Other abnormalities of gait and mobility: Secondary | ICD-10-CM | POA: Diagnosis not present

## 2017-03-17 DIAGNOSIS — R262 Difficulty in walking, not elsewhere classified: Secondary | ICD-10-CM

## 2017-03-17 DIAGNOSIS — M6281 Muscle weakness (generalized): Secondary | ICD-10-CM | POA: Diagnosis not present

## 2017-03-17 DIAGNOSIS — R29898 Other symptoms and signs involving the musculoskeletal system: Secondary | ICD-10-CM | POA: Diagnosis not present

## 2017-03-17 DIAGNOSIS — M25552 Pain in left hip: Secondary | ICD-10-CM | POA: Diagnosis not present

## 2017-03-17 NOTE — Telephone Encounter (Signed)
03/17/17   pt said that he wouldn't be able to come to this appt.
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
## 2017-03-18 ENCOUNTER — Other Ambulatory Visit: Payer: Self-pay | Admitting: Internal Medicine

## 2017-03-19 ENCOUNTER — Ambulatory Visit (HOSPITAL_COMMUNITY): Payer: Medicare Other

## 2017-03-26 ENCOUNTER — Encounter (HOSPITAL_COMMUNITY): Payer: Self-pay

## 2017-03-26 ENCOUNTER — Ambulatory Visit (HOSPITAL_COMMUNITY): Payer: Medicare Other

## 2017-03-26 DIAGNOSIS — R29898 Other symptoms and signs involving the musculoskeletal system: Secondary | ICD-10-CM

## 2017-03-26 DIAGNOSIS — M6281 Muscle weakness (generalized): Secondary | ICD-10-CM

## 2017-03-26 DIAGNOSIS — R262 Difficulty in walking, not elsewhere classified: Secondary | ICD-10-CM

## 2017-03-26 DIAGNOSIS — M25552 Pain in left hip: Secondary | ICD-10-CM

## 2017-03-26 DIAGNOSIS — R2689 Other abnormalities of gait and mobility: Secondary | ICD-10-CM | POA: Diagnosis not present

## 2017-03-26 NOTE — Patient Instructions (Signed)
  PRONE ON ELBOWS - POE  Lying face down, slowly press up and prop yourself up on your elbows. Use 2 pillows under your hips  Perform 2x/day, lay in this position for as long as you can
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
## 2017-03-28 ENCOUNTER — Encounter (HOSPITAL_COMMUNITY): Payer: Self-pay

## 2017-03-28 ENCOUNTER — Ambulatory Visit (HOSPITAL_COMMUNITY): Payer: Medicare Other

## 2017-03-28 DIAGNOSIS — M25552 Pain in left hip: Secondary | ICD-10-CM

## 2017-03-28 DIAGNOSIS — R2689 Other abnormalities of gait and mobility: Secondary | ICD-10-CM | POA: Diagnosis not present

## 2017-03-28 DIAGNOSIS — M6281 Muscle weakness (generalized): Secondary | ICD-10-CM

## 2017-03-28 DIAGNOSIS — R29898 Other symptoms and signs involving the musculoskeletal system: Secondary | ICD-10-CM | POA: Diagnosis not present

## 2017-03-28 DIAGNOSIS — R262 Difficulty in walking, not elsewhere classified: Secondary | ICD-10-CM | POA: Diagnosis not present
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
## 2017-03-31 DIAGNOSIS — B351 Tinea unguium: Secondary | ICD-10-CM | POA: Diagnosis not present

## 2017-03-31 DIAGNOSIS — M79675 Pain in left toe(s): Secondary | ICD-10-CM | POA: Diagnosis not present

## 2017-03-31 DIAGNOSIS — M79674 Pain in right toe(s): Secondary | ICD-10-CM | POA: Diagnosis not present

## 2017-04-01 ENCOUNTER — Ambulatory Visit (HOSPITAL_COMMUNITY): Payer: Medicare Other

## 2017-04-01 DIAGNOSIS — R2689 Other abnormalities of gait and mobility: Secondary | ICD-10-CM

## 2017-04-01 DIAGNOSIS — M25552 Pain in left hip: Secondary | ICD-10-CM

## 2017-04-01 DIAGNOSIS — R262 Difficulty in walking, not elsewhere classified: Secondary | ICD-10-CM

## 2017-04-01 DIAGNOSIS — R29898 Other symptoms and signs involving the musculoskeletal system: Secondary | ICD-10-CM

## 2017-04-01 DIAGNOSIS — M6281 Muscle weakness (generalized): Secondary | ICD-10-CM
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
## 2017-04-03 ENCOUNTER — Encounter (HOSPITAL_COMMUNITY): Payer: Self-pay

## 2017-04-03 ENCOUNTER — Ambulatory Visit (HOSPITAL_COMMUNITY): Payer: Medicare Other

## 2017-04-03 DIAGNOSIS — M6281 Muscle weakness (generalized): Secondary | ICD-10-CM

## 2017-04-03 DIAGNOSIS — R29898 Other symptoms and signs involving the musculoskeletal system: Secondary | ICD-10-CM

## 2017-04-03 DIAGNOSIS — M25552 Pain in left hip: Secondary | ICD-10-CM | POA: Diagnosis not present

## 2017-04-03 DIAGNOSIS — R2689 Other abnormalities of gait and mobility: Secondary | ICD-10-CM | POA: Diagnosis not present

## 2017-04-03 DIAGNOSIS — R262 Difficulty in walking, not elsewhere classified: Secondary | ICD-10-CM
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
## 2017-04-07 ENCOUNTER — Ambulatory Visit: Payer: Self-pay | Admitting: Internal Medicine

## 2017-04-08 ENCOUNTER — Ambulatory Visit (HOSPITAL_COMMUNITY): Payer: Medicare Other | Attending: Orthopaedic Surgery

## 2017-04-08 ENCOUNTER — Encounter (HOSPITAL_COMMUNITY): Payer: Self-pay

## 2017-04-08 DIAGNOSIS — R29898 Other symptoms and signs involving the musculoskeletal system: Secondary | ICD-10-CM | POA: Diagnosis not present

## 2017-04-08 DIAGNOSIS — R2689 Other abnormalities of gait and mobility: Secondary | ICD-10-CM | POA: Insufficient documentation

## 2017-04-08 DIAGNOSIS — M6281 Muscle weakness (generalized): Secondary | ICD-10-CM | POA: Insufficient documentation

## 2017-04-08 DIAGNOSIS — R262 Difficulty in walking, not elsewhere classified: Secondary | ICD-10-CM | POA: Diagnosis not present

## 2017-04-08 DIAGNOSIS — M25552 Pain in left hip: Secondary | ICD-10-CM | POA: Insufficient documentation
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
## 2017-04-09 ENCOUNTER — Other Ambulatory Visit: Payer: Self-pay | Admitting: Internal Medicine

## 2017-04-09 DIAGNOSIS — J301 Allergic rhinitis due to pollen: Secondary | ICD-10-CM

## 2017-04-10 ENCOUNTER — Encounter (HOSPITAL_COMMUNITY): Payer: Self-pay

## 2017-04-10 ENCOUNTER — Ambulatory Visit (HOSPITAL_COMMUNITY): Payer: Medicare Other

## 2017-04-10 DIAGNOSIS — R2689 Other abnormalities of gait and mobility: Secondary | ICD-10-CM | POA: Diagnosis not present

## 2017-04-10 DIAGNOSIS — R29898 Other symptoms and signs involving the musculoskeletal system: Secondary | ICD-10-CM | POA: Diagnosis not present

## 2017-04-10 DIAGNOSIS — R262 Difficulty in walking, not elsewhere classified: Secondary | ICD-10-CM

## 2017-04-10 DIAGNOSIS — M25552 Pain in left hip: Secondary | ICD-10-CM | POA: Diagnosis not present

## 2017-04-10 DIAGNOSIS — M6281 Muscle weakness (generalized): Secondary | ICD-10-CM | POA: Diagnosis not present

## 2017-04-10 NOTE — Patient Instructions (Signed)
  Side Step Ups  Place affected extremity onto step. Step up and touch step with other foot.  Perform 1x/day, 2-3 sets of 10-15 reps on each leg.    Step Ups  Start by facing the step. Step up with the affected leg, followed by the unaffected leg. Step back down with the unaffected leg, followed by the affected leg.   Perform 1x/day, 2-3 sets of 10-15 reps on each leg.    lateral walk at pole or counter   Standing at a support surface (role or counter top) use hands to assist with balance. Perform sideways walking down and back until you feel that you need to take break.   It may be help to place a chair close to allow for safe rest breaks.   Perform 1x/day 5-10 laps at [employer] with red or green band.    LOOPED ELASTIC BAND HIP ABDUCTION  While standing with an elastic band looped around your ankles, move the target leg out to the side as shown.   Perform 1x/day, 2-3 sets of 10-15 reps on each leg with red and then progressing to green band
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
## 2017-04-15 ENCOUNTER — Encounter (HOSPITAL_COMMUNITY): Payer: Self-pay

## 2017-04-17 ENCOUNTER — Other Ambulatory Visit: Payer: Self-pay | Admitting: Internal Medicine

## 2017-04-17 ENCOUNTER — Telehealth: Payer: Self-pay | Admitting: *Deleted

## 2017-04-17 ENCOUNTER — Encounter (HOSPITAL_COMMUNITY): Payer: Self-pay

## 2017-04-17 MED ORDER — NITROGLYCERIN 0.4 MG SL SUBL: 0.4000 mg | SUBLINGUAL_TABLET | SUBLINGUAL | 0 refills | Status: DC | PRN

## 2017-04-17 NOTE — Telephone Encounter (Signed)
Can you advise in PCP absence.  

## 2017-04-17 NOTE — Telephone Encounter (Signed)
Rx refill sent in.

## 2017-04-21 ENCOUNTER — Telehealth: Payer: Self-pay | Admitting: Internal Medicine

## 2017-04-21 NOTE — Telephone Encounter (Signed)
Called asking if we have received the forms for the diabestic glucose monitor, she needs them faxed back today if possible

## 2017-04-21 NOTE — Telephone Encounter (Signed)
Can you inform that this has been faxed to the facility and to let us know if they do not receive it.

## 2017-04-22 ENCOUNTER — Other Ambulatory Visit (INDEPENDENT_AMBULATORY_CARE_PROVIDER_SITE_OTHER): Payer: Self-pay

## 2017-04-22 ENCOUNTER — Ambulatory Visit (INDEPENDENT_AMBULATORY_CARE_PROVIDER_SITE_OTHER): Payer: Medicare Other | Admitting: Orthopaedic Surgery

## 2017-04-22 DIAGNOSIS — M1611 Unilateral primary osteoarthritis, right hip: Secondary | ICD-10-CM | POA: Insufficient documentation

## 2017-04-22 DIAGNOSIS — M25551 Pain in right hip: Secondary | ICD-10-CM | POA: Diagnosis not present

## 2017-04-22 NOTE — Progress Notes (Signed)
The patient is well-known to [place]. He is morbidly obese 66 year old gentleman with multiple medical problems including being on blood thinning medication due to heart valve issue. He's been going to physical therapy to work on just generalized conditioning of his upper and lower extremities. He has known arthritis in both his knees and his right hip. The right hip still bothers him the most. He has good days and bad days he states. He does use a cane for mobility.  On examination his knees today show pretty good range of motion of the knees with minimal pain. He has significant limitations of range of motion of his right hip. Internal/external rotation of that hip does cause pain in the groin.  This point we still want to try to avoid surgery given his multiple comorbidities. He does state his blood glucose is been under good control. This morning his blood sugar was 103. We will set him up for an intra-articular steroid injection hopefully the next week or so by Dr. Alvester Morin for his right hip. I would like see him back in 1 month. At that visit I would like a low AP pelvis.

## 2017-04-22 NOTE — Telephone Encounter (Signed)
Left message with phone operator for Lupita Leash.  Informed to call us back if did not receive.

## 2017-04-23 ENCOUNTER — Ambulatory Visit (INDEPENDENT_AMBULATORY_CARE_PROVIDER_SITE_OTHER): Payer: Medicare Other | Admitting: Internal Medicine

## 2017-04-23 ENCOUNTER — Other Ambulatory Visit (INDEPENDENT_AMBULATORY_CARE_PROVIDER_SITE_OTHER): Payer: Medicare Other

## 2017-04-23 ENCOUNTER — Encounter: Payer: Self-pay | Admitting: Internal Medicine

## 2017-04-23 VITALS — BP 140/70 | HR 64 | Temp 98.3°F | Resp 16 | Ht 69.0 in | Wt 272.0 lb

## 2017-04-23 DIAGNOSIS — E785 Hyperlipidemia, unspecified: Secondary | ICD-10-CM

## 2017-04-23 DIAGNOSIS — E118 Type 2 diabetes mellitus with unspecified complications: Secondary | ICD-10-CM

## 2017-04-23 DIAGNOSIS — Z23 Encounter for immunization: Secondary | ICD-10-CM

## 2017-04-23 DIAGNOSIS — I10 Essential (primary) hypertension: Secondary | ICD-10-CM

## 2017-04-23 LAB — LIPID PANEL
Cholesterol: 82 mg/dL (ref 0–200)
HDL: 34.1 mg/dL — ABNORMAL LOW (ref >39.00)
LDL Cholesterol: 20 mg/dL (ref 0–99)
NonHDL: 48.33
Total CHOL/HDL Ratio: 2
Triglycerides: 141 mg/dL (ref 0.0–149.0)
VLDL: 28.2 mg/dL (ref 0.0–40.0)

## 2017-04-23 LAB — HEMOGLOBIN A1C: Hgb A1c MFr Bld: 6.8 % — ABNORMAL HIGH (ref 4.6–6.5)

## 2017-04-23 NOTE — Patient Instructions (Signed)
Diabetes Mellitus and Food It is important for you to manage your blood sugar (glucose) level. Your blood glucose level can be greatly affected by what you eat. Eating healthier foods in the appropriate amounts throughout the day at about the same time each day will help you control your blood glucose level. It can also help slow or prevent worsening of your diabetes mellitus. Healthy eating may even help you improve the level of your blood pressure and reach or maintain a healthy weight. General recommendations for healthful eating and cooking habits include:  Eating meals and snacks regularly. Avoid going long periods of time without eating to lose weight.  Eating a diet that consists mainly of plant-based foods, such as fruits, vegetables, nuts, legumes, and whole grains.  Using low-heat cooking methods, such as baking, instead of high-heat cooking methods, such as deep frying.  Work with your dietitian to make sure you understand how to use the Nutrition Facts information on food labels. How can food affect me? Carbohydrates Carbohydrates affect your blood glucose level more than any other type of food. Your dietitian will help you determine how many carbohydrates to eat at each meal and teach you how to count carbohydrates. Counting carbohydrates is important to keep your blood glucose at a healthy level, especially if you are using insulin or taking certain medicines for diabetes mellitus. Alcohol Alcohol can cause sudden decreases in blood glucose (hypoglycemia), especially if you use insulin or take certain medicines for diabetes mellitus. Hypoglycemia can be a life-threatening condition. Symptoms of hypoglycemia (sleepiness, dizziness, and disorientation) are similar to symptoms of having too much alcohol. If your health care provider has given you approval to drink alcohol, do so in moderation and use the following guidelines:  Women should not have more than one drink per day, and men  should not have more than two drinks per day. One drink is equal to: ? 12 oz of beer. ? 5 oz of wine. ? 1 oz of hard liquor.  Do not drink on an empty stomach.  Keep yourself hydrated. Have water, diet soda, or unsweetened iced tea.  Regular soda, juice, and other mixers might contain a lot of carbohydrates and should be counted.  What foods are not recommended? As you make food choices, it is important to remember that all foods are not the same. Some foods have fewer nutrients per serving than other foods, even though they might have the same number of calories or carbohydrates. It is difficult to get your body what it needs when you eat foods with fewer nutrients. Examples of foods that you should avoid that are high in calories and carbohydrates but low in nutrients include:  Trans fats (most processed foods list trans fats on the Nutrition Facts label).  Regular soda.  Juice.  Candy.  Sweets, such as cake, pie, doughnuts, and cookies.  Fried foods.  What foods can I eat? Eat nutrient-rich foods, which will nourish your body and keep you healthy. The food you should eat also will depend on several factors, including:  The calories you need.  The medicines you take.  Your weight.  Your blood glucose level.  Your blood pressure level.  Your cholesterol level.  You should eat a variety of foods, including:  Protein. ? Lean cuts of meat. ? Proteins low in saturated fats, such as fish, egg whites, and beans. Avoid processed meats.  Fruits and vegetables. ? Fruits and vegetables that may help control blood glucose levels, such as apples,   mangoes, and yams.  Dairy products. ? Choose fat-free or low-fat dairy products, such as milk, yogurt, and cheese.  Grains, bread, pasta, and rice. ? Choose whole grain products, such as multigrain bread, whole oats, and brown rice. These foods may help control blood pressure.  Fats. ? Foods containing healthful fats, such as  nuts, avocado, olive oil, canola oil, and fish.  Does everyone with diabetes mellitus have the same meal plan? Because every person with diabetes mellitus is different, there is not one meal plan that works for everyone. It is very important that you meet with a dietitian who will help you create a meal plan that is just right for you. This information is not intended to replace advice given to you by your health care provider. Make sure you discuss any questions you have with your health care provider. Document Released: 03/21/2005 Document Revised: 11/30/2015 Document Reviewed: 05/21/2013 Elsevier Interactive Patient Education  2017 Elsevier Inc.  

## 2017-04-23 NOTE — Progress Notes (Signed)
Subjective:  Patient ID: Cody Hale, male    DOB: [birth year]  Age: 66 y.o. MRN: [id number]  CC: Hypertension; Hyperlipidemia; and Diabetes   HPI Cody Hale presents for f/up - he feels well and offers no complaints.  Outpatient Medications Prior to Visit  Medication Sig Dispense Refill  . canagliflozin (INVOKANA) 100 MG TABS tablet Take 1 tablet (100 mg total) by mouth daily before breakfast. 90 tablet 1  . carvedilol (COREG) 6.25 MG tablet TAKE 1 TABLET BY MOUTH TWICE DAILY. 60 tablet 11  . colchicine 0.6 MG tablet Take 1 tablet (0.6 mg total) by mouth 2 (two) times daily. 60 tablet 2  . fluticasone (FLONASE) 50 MCG/ACT nasal spray Place 2 sprays into both nostrils daily. 16 g 5  . insulin lispro (HUMALOG) 100 UNIT/ML KiwkPen Inject 0.05 mLs (5 Units total) into the skin 3 (three) times daily. 15 mL 11  . LANTUS SOLOSTAR 100 UNIT/ML Solostar Pen INJECT 58 UNITS SUBCUTANEOUSLY AT 10pm. 15 mL 3  . levocetirizine (XYZAL) 5 MG tablet TAKE 1 TABLET BY MOUTH EACH EVENING. 30 tablet 5  . metFORMIN (GLUCOPHAGE-XR) 750 MG 24 hr tablet TAKE 2 TABLETS BY MOUTH EACH MORNING WITH BREAKFAST. 60 tablet 5  . metolazone (ZAROXOLYN) 2.5 MG tablet TAKE ONE TABLET BY MOUTH EVERY OTHER DAY. 15 tablet 3  . Multiple Vitamin (DAILY VITE) TABS TAKE 1 TABLET BY MOUTH ONCE DAILY. 30 each 11  . nitroGLYCERIN (NITROSTAT) 0.4 MG SL tablet Place 1 tablet (0.4 mg total) under the tongue every 5 (five) minutes as needed for chest pain. Max 3 doses. 10 tablet 0  . NOVOFINE AUTOCOVER 30G X 8 MM MISC USE AS DIRECTED FOR INSULIN ADMINISTRATION. 100 each 11  . oxyCODONE (OXY IR/ROXICODONE) 5 MG immediate release tablet Take 1 tablet (5 mg total) by mouth every 6 (six) hours as needed for severe pain. 75 tablet 0  . simvastatin (ZOCOR) 20 MG tablet TAKE 1 TABLET BY MOUTH ONCE DAILY. 30 tablet 5  . VIIBRYD 40 MG TABS TAKE 1 TABLET BY MOUTH ONCE DAILY. 30 tablet 11  . XARELTO 20 MG TABS tablet TAKE 1 TABLET BY MOUTH  ONCE DAILY. 30 tablet 3   No facility-administered medications prior to visit.     ROS Review of Systems  Constitutional: Negative for diaphoresis, fatigue and unexpected weight change.  HENT: Negative.   Eyes: Negative.   Respiratory: Negative.  Negative for cough, chest tightness, shortness of breath and wheezing.   Cardiovascular: Negative.  Negative for chest pain, palpitations and leg swelling.  Gastrointestinal: Negative for abdominal pain, constipation, diarrhea, nausea and vomiting.  Endocrine: Negative.   Genitourinary: Negative.   Skin: Negative.   Allergic/Immunologic: Negative.   Neurological: Negative.  Negative for dizziness.  Hematological: Negative.  Negative for adenopathy. Does not bruise/bleed easily.  Psychiatric/Behavioral: Negative.     Objective:  BP 140/70 (BP Location: Left Arm, Patient Position: Sitting, Cuff Size: Large)   Pulse 64   Temp 98.3 F (36.8 C) (Oral)   Resp 16   Ht 5\' 9"  (1.753 m)   Wt 272 lb (123.4 kg)   SpO2 94%   BMI 40.17 kg/m   BP Readings from Last 3 Encounters:  04/23/17 140/70  02/26/17 (!) 148/80  02/13/17 (!) 147/63    Wt Readings from Last 3 Encounters:  04/23/17 272 lb (123.4 kg)  02/26/17 277 lb 4 oz (125.8 kg)  02/11/17 279 lb 1.6 oz (126.6 kg)    Physical Exam  Constitutional:  He is oriented to person, place, and time. No distress.  HENT:  Mouth/Throat: Oropharynx is clear and moist. No oropharyngeal exudate.  Eyes: Conjunctivae are normal. Right eye exhibits no discharge. Left eye exhibits no discharge. No scleral icterus.  Neck: Normal range of motion. Neck supple. No JVD present. No thyromegaly present.  Cardiovascular: Normal rate, regular rhythm and intact distal pulses.  Exam reveals no gallop.   No murmur heard. Pulmonary/Chest: Effort normal and breath sounds normal. No respiratory distress. He has no wheezes. He has no rales. He exhibits no tenderness.  Abdominal: Soft. Bowel sounds are normal. He  exhibits no distension and no mass. There is no tenderness. There is no rebound and no guarding.  Musculoskeletal: Normal range of motion. He exhibits no edema, tenderness or deformity.  Lymphadenopathy:    He has no cervical adenopathy.  Neurological: He is alert and oriented to person, place, and time.  Skin: Skin is warm and dry. He is not diaphoretic.  Vitals reviewed.   Lab Results  Component Value Date   WBC 7.1 02/26/2017   HGB 12.8 (L) 02/26/2017   HCT 39.2 02/26/2017   PLT 224.0 02/26/2017   GLUCOSE 179 (H) 02/26/2017   CHOL 102 03/13/2016   TRIG 108.0 03/13/2016   HDL 46.80 03/13/2016   LDLDIRECT 84.2 01/31/2014   LDLCALC 34 03/13/2016   ALT 19 02/11/2017   AST 16 02/11/2017   NA 141 02/26/2017   K 3.7 02/26/2017   CL 96 02/26/2017   CREATININE 1.45 02/26/2017   BUN 17 02/26/2017   CO2 37 (H) 02/26/2017   TSH 0.80 12/31/2016   PSA 0.36 05/30/2014   INR 2.61 02/10/2017   HGBA1C 7.3 12/31/2016   MICROALBUR 19.9 (H) 07/11/2016    Dg Foot Complete Left  Result Date: 02/26/2017 CLINICAL DATA:  Swelling, painful for 4 days.  No known injury. EXAM: LEFT FOOT - COMPLETE 3+ VIEW COMPARISON:  None. FINDINGS: There is no evidence of fracture or dislocation. 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 There is a plantar calcaneal spur. There is enthesopathic change at the Achilles tendon insertion. There is soft tissue swelling along the dorsal aspect of the foot. IMPRESSION: No acute osseous injury of the left foot. Electronically Signed   By: Elige KoHetal  Patel   On: 02/26/2017 11:26    Assessment & Plan:   Cody Hale was seen today for hypertension, hyperlipidemia and diabetes.  Diagnoses and all orders for this visit:  Need for pneumococcal vaccination -     Pneumococcal conjugate vaccine 13-valent  Essential hypertension, malignant- his BP is well controlled  Type 2 diabetes  mellitus with complication, without long-term current use of insulin (HCC)- his blood sugars are adequately well controlled -     Hemoglobin A1c; Future  Hyperlipidemia with target LDL less than 70- he has achieved his LDL goal and is doing well on the statin -     Lipid panel; Future   I am having Mr. Katrinka BlazingSmith maintain his carvedilol, VIIBRYD, DAILY VITE, NOVOFINE AUTOCOVER, canagliflozin, levocetirizine, metFORMIN, metolazone, simvastatin, insulin lispro, colchicine, LANTUS SOLOSTAR, oxyCODONE, XARELTO, fluticasone, and nitroGLYCERIN.  No orders of the defined types were placed in this encounter.    Follow-up: No Follow-up on file.  Sanda Lingerhomas Abdinasir Spadafore, MD

## 2017-05-05 ENCOUNTER — Ambulatory Visit (INDEPENDENT_AMBULATORY_CARE_PROVIDER_SITE_OTHER): Payer: Medicare Other

## 2017-05-05 ENCOUNTER — Encounter (INDEPENDENT_AMBULATORY_CARE_PROVIDER_SITE_OTHER): Payer: Self-pay | Admitting: Physical Medicine and Rehabilitation

## 2017-05-05 ENCOUNTER — Ambulatory Visit (INDEPENDENT_AMBULATORY_CARE_PROVIDER_SITE_OTHER): Payer: Medicare Other | Admitting: Physical Medicine and Rehabilitation

## 2017-05-05 VITALS — BP 140/68 | Temp 97.8°F | Resp 60

## 2017-05-05 DIAGNOSIS — M25551 Pain in right hip: Secondary | ICD-10-CM | POA: Diagnosis not present

## 2017-05-05 MED ORDER — TRIAMCINOLONE ACETONIDE 40 MG/ML IJ SUSP
80.0000 mg | INTRAMUSCULAR | Status: AC | PRN
  Administered 2017-05-05: 80 mg via INTRA_ARTICULAR

## 2017-05-05 MED ORDER — BUPIVACAINE HCL 0.5 % IJ SOLN
3.0000 mL | INTRAMUSCULAR | Status: AC | PRN
  Administered 2017-05-05: 3 mL via INTRA_ARTICULAR

## 2017-05-05 NOTE — Progress Notes (Deleted)
Pt here for Right Hip injection, is having some numbness when he walks feels like the leg wants to give away. Pt gets worse when does a lot of walking. Taking oxycodone when in pain. 

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
## 2017-05-05 NOTE — Patient Instructions (Signed)

## 2017-05-20 ENCOUNTER — Ambulatory Visit (INDEPENDENT_AMBULATORY_CARE_PROVIDER_SITE_OTHER): Payer: Medicare Other

## 2017-05-20 ENCOUNTER — Encounter (INDEPENDENT_AMBULATORY_CARE_PROVIDER_SITE_OTHER): Payer: Self-pay | Admitting: Orthopaedic Surgery

## 2017-05-20 ENCOUNTER — Ambulatory Visit (INDEPENDENT_AMBULATORY_CARE_PROVIDER_SITE_OTHER): Payer: Medicare Other | Admitting: Orthopaedic Surgery

## 2017-05-20 DIAGNOSIS — M25551 Pain in right hip: Secondary | ICD-10-CM

## 2017-05-20 DIAGNOSIS — M1611 Unilateral primary osteoarthritis, right hip: Secondary | ICD-10-CM

## 2017-05-20 NOTE — Progress Notes (Signed)
The patient is following up today after having an intra-articular steroid injection in his right hip under direct fluoroscopy by Dr. Alvester Morin.  He says that is helped quite a bit and he has known severe bone-on-bone arthritis of that right hip and is been well documented with clinical exam and x-rays.  He has some with a complicated medical history given heart disease and a previous valve replacement.  He is on long-term blood thinners.  He is a diabetic as well.  He says his blood glucose has been under good control.  He does want to consider hip replacement at some point in the future if he can get his health status improved and he can get clearance from his cardiologist.  He says he will make appointment with cardiology at some point and he does not want to rush into anything more July.  On exam his left hip moves easily his right hip has pain with internal X rotation is definitely painful in that area as well.  At this point we will see him back in 4 months to see how he is doing overall and see if we want a repeat injection at some point.  I do not need x-rays and come back.

## 2017-05-22 DIAGNOSIS — E119 Type 2 diabetes mellitus without complications: Secondary | ICD-10-CM | POA: Diagnosis not present

## 2017-05-22 DIAGNOSIS — Z794 Long term (current) use of insulin: Secondary | ICD-10-CM | POA: Diagnosis not present

## 2017-05-22 DIAGNOSIS — H25813 Combined forms of age-related cataract, bilateral: Secondary | ICD-10-CM | POA: Diagnosis not present

## 2017-05-27 ENCOUNTER — Telehealth: Payer: Self-pay | Admitting: Internal Medicine

## 2017-05-27 ENCOUNTER — Telehealth: Payer: Self-pay

## 2017-05-27 DIAGNOSIS — M159 Polyosteoarthritis, unspecified: Secondary | ICD-10-CM

## 2017-05-27 DIAGNOSIS — M150 Primary generalized (osteo)arthritis: Secondary | ICD-10-CM

## 2017-05-27 DIAGNOSIS — Z79899 Other long term (current) drug therapy: Secondary | ICD-10-CM

## 2017-05-27 DIAGNOSIS — M8949 Other hypertrophic osteoarthropathy, multiple sites: Secondary | ICD-10-CM

## 2017-05-27 MED ORDER — OXYCODONE HCL 5 MG PO TABS: 5.0000 mg | ORAL_TABLET | Freq: Two times a day (BID) | ORAL | 0 refills | Status: DC | PRN

## 2017-05-27 NOTE — Telephone Encounter (Signed)
Check [place] registry last filled 03/18/2017. MD is out of the office this week pls advise on refill...Raechel Chute

## 2017-05-27 NOTE — Telephone Encounter (Signed)
Cannot be done without UDS

## 2017-05-27 NOTE — Telephone Encounter (Signed)
I cannot fill for more than a 1 month supply and he is filling every 2-3 months. Needs to come in for UDS prior to sending in refill which is printed. Will only fill for #35 pills. Needs letter about controlled substances given to him when he comes in for UDS.

## 2017-05-27 NOTE — Telephone Encounter (Signed)
Called patient in regard to oxycodone Rx being ready for pick up and was put on hold. Had to hang up to room patients. Patient needs to pick up Rx because a UDS needs to be done

## 2017-05-27 NOTE — Telephone Encounter (Signed)
Pt is needing a refill on oxyCODONE (OXY IR/ROXICODONE) 5 MG immediate release tablet.

## 2017-05-27 NOTE — Telephone Encounter (Signed)
Called pt but this is an assisting living facility. Had to speak w/ floor manager (donna) gave her MD response. She states that they are not able to bring pt in just to have a UDS. Any appt that patients have must have a 5 day working notice for transportation. She states pt had appt couple weeks ago and MD didn't have him to do one. He is needing medication will be out tomorrow and must have through the holiday. Requesting rx to be sent to [facility]...Raechel Chute

## 2017-05-28 ENCOUNTER — Other Ambulatory Visit: Payer: Medicare Other

## 2017-05-28 DIAGNOSIS — Z79899 Other long term (current) drug therapy: Secondary | ICD-10-CM

## 2017-05-28 NOTE — Telephone Encounter (Signed)
Okay to resend electronically with verification of urine collection. Please fax the letter to them for updated controlled substance policy.

## 2017-05-28 NOTE — Telephone Encounter (Signed)
[name] called back.  States she is close to a [facility].  States she could also take to hospital at [facility].   [phone]..   Also, requesting call back from doctor.

## 2017-05-28 NOTE — Telephone Encounter (Signed)
Called facility and spoke to [place].  She stated that she will see what she can work out and wanted [place] to know that all of residents medications are in a controlled environment. Informed that we were not able to change what was request and what is required per our protocol. [name] will see what she can do to get patients medication.

## 2017-05-28 NOTE — Telephone Encounter (Signed)
This has been picked up and patient has signed form.

## 2017-05-28 NOTE — Telephone Encounter (Signed)
[name] called back and want to know if they were able to get the patient to [facility] to have urine collected, if that would be sufficient?

## 2017-05-28 NOTE — Addendum Note (Signed)
Addended by: Radford Pax M on: 05/28/2017 11:25 AM   Modules accepted: Orders

## 2017-06-03 LAB — PAIN MGMT, PROFILE 8 W/CONF, U
6 Acetylmorphine: NEGATIVE ng/mL (ref <10)
Alcohol Metabolites: NEGATIVE ng/mL (ref <500)
Amphetamines: NEGATIVE ng/mL (ref <500)
Benzodiazepines: NEGATIVE ng/mL (ref <100)
Buprenorphine, Urine: NEGATIVE ng/mL (ref <5)
Cocaine Metabolite: NEGATIVE ng/mL (ref <150)
Creatinine: 77.2 mg/dL
MDMA: NEGATIVE ng/mL (ref <500)
Marijuana Metabolite: NEGATIVE ng/mL (ref <20)
Noroxycodone: 143 ng/mL — ABNORMAL HIGH (ref <50)
Opiates: NEGATIVE ng/mL (ref <100)
Oxidant: NEGATIVE ug/mL (ref <200)
Oxycodone: 79 ng/mL — ABNORMAL HIGH (ref <50)
Oxycodone: POSITIVE ng/mL — AB (ref <100)
Oxymorphone: 186 ng/mL — ABNORMAL HIGH (ref <50)
pH: 5.6 (ref 4.5–9.0)

## 2017-06-24 ENCOUNTER — Ambulatory Visit: Payer: Self-pay

## 2017-06-24 ENCOUNTER — Ambulatory Visit: Payer: Self-pay | Admitting: Internal Medicine

## 2017-07-06 ENCOUNTER — Other Ambulatory Visit: Payer: Self-pay | Admitting: Internal Medicine

## 2017-07-06 DIAGNOSIS — M150 Primary generalized (osteo)arthritis: Principal | ICD-10-CM

## 2017-07-06 DIAGNOSIS — M8949 Other hypertrophic osteoarthropathy, multiple sites: Secondary | ICD-10-CM

## 2017-07-06 DIAGNOSIS — M159 Polyosteoarthritis, unspecified: Secondary | ICD-10-CM

## 2017-07-06 MED ORDER — OXYCODONE HCL 5 MG PO TABS: 5.0000 mg | ORAL_TABLET | Freq: Two times a day (BID) | ORAL | 0 refills | Status: DC | PRN

## 2017-07-07 ENCOUNTER — Other Ambulatory Visit: Payer: Self-pay | Admitting: Internal Medicine

## 2017-07-07 DIAGNOSIS — M8949 Other hypertrophic osteoarthropathy, multiple sites: Secondary | ICD-10-CM

## 2017-07-07 DIAGNOSIS — M159 Polyosteoarthritis, unspecified: Secondary | ICD-10-CM

## 2017-07-07 DIAGNOSIS — M150 Primary generalized (osteo)arthritis: Principal | ICD-10-CM

## 2017-07-07 MED ORDER — OXYCODONE HCL 5 MG PO TABS: 5.0000 mg | ORAL_TABLET | Freq: Two times a day (BID) | ORAL | 0 refills | Status: DC | PRN

## 2017-07-10 ENCOUNTER — Telehealth: Payer: Self-pay

## 2017-07-10 NOTE — Telephone Encounter (Signed)
No, keep it prn

## 2017-07-10 NOTE — Telephone Encounter (Signed)
Facility is asking for  Refill on Oxycodone 5 mg as a scheduled rx instead of prn. Please advise. Thanks
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
## 2017-07-14 ENCOUNTER — Encounter: Payer: Self-pay | Admitting: Internal Medicine

## 2017-07-14 ENCOUNTER — Ambulatory Visit (INDEPENDENT_AMBULATORY_CARE_PROVIDER_SITE_OTHER): Payer: Medicare Other | Admitting: Internal Medicine

## 2017-07-14 ENCOUNTER — Other Ambulatory Visit (INDEPENDENT_AMBULATORY_CARE_PROVIDER_SITE_OTHER): Payer: Medicare Other

## 2017-07-14 ENCOUNTER — Ambulatory Visit (INDEPENDENT_AMBULATORY_CARE_PROVIDER_SITE_OTHER): Payer: Medicare Other | Admitting: *Deleted

## 2017-07-14 VITALS — BP 142/74 | HR 77 | Temp 97.1°F | Ht 72.0 in | Wt 268.0 lb

## 2017-07-14 VITALS — BP 142/74 | HR 77 | Temp 97.7°F | Resp 20 | Ht 69.0 in | Wt 268.0 lb

## 2017-07-14 DIAGNOSIS — E118 Type 2 diabetes mellitus with unspecified complications: Secondary | ICD-10-CM | POA: Diagnosis not present

## 2017-07-14 DIAGNOSIS — Z0000 Encounter for general adult medical examination without abnormal findings: Secondary | ICD-10-CM

## 2017-07-14 DIAGNOSIS — Z794 Long term (current) use of insulin: Secondary | ICD-10-CM | POA: Diagnosis not present

## 2017-07-14 DIAGNOSIS — I10 Essential (primary) hypertension: Secondary | ICD-10-CM

## 2017-07-14 DIAGNOSIS — D510 Vitamin B12 deficiency anemia due to intrinsic factor deficiency: Secondary | ICD-10-CM

## 2017-07-14 LAB — MICROALBUMIN / CREATININE URINE RATIO
Creatinine,U: 113.7 mg/dL
Microalb Creat Ratio: 3.5 mg/g (ref 0.0–30.0)
Microalb, Ur: 4 mg/dL — ABNORMAL HIGH (ref 0.0–1.9)

## 2017-07-14 LAB — CBC WITH DIFFERENTIAL/PLATELET
Basophils Absolute: 0 10*3/uL (ref 0.0–0.1)
Basophils Relative: 0.9 % (ref 0.0–3.0)
Eosinophils Absolute: 0.1 10*3/uL (ref 0.0–0.7)
Eosinophils Relative: 2.3 % (ref 0.0–5.0)
HCT: 49.1 % (ref 39.0–52.0)
Hemoglobin: 16 g/dL (ref 13.0–17.0)
Lymphocytes Relative: 17.3 % (ref 12.0–46.0)
Lymphs Abs: 0.8 10*3/uL (ref 0.7–4.0)
MCHC: 32.6 g/dL (ref 30.0–36.0)
MCV: 90.8 fl (ref 78.0–100.0)
Monocytes Absolute: 0.8 10*3/uL (ref 0.1–1.0)
Monocytes Relative: 17.7 % — ABNORMAL HIGH (ref 3.0–12.0)
Neutro Abs: 2.9 10*3/uL (ref 1.4–7.7)
Neutrophils Relative %: 61.8 % (ref 43.0–77.0)
Platelets: 110 10*3/uL — ABNORMAL LOW (ref 150.0–400.0)
RBC: 5.41 Mil/uL (ref 4.22–5.81)
RDW: 16.2 % — ABNORMAL HIGH (ref 11.5–15.5)
WBC: 4.7 10*3/uL (ref 4.0–10.5)

## 2017-07-14 LAB — BASIC METABOLIC PANEL
BUN: 23 mg/dL (ref 6–23)
CO2: 35 mEq/L — ABNORMAL HIGH (ref 19–32)
Calcium: 9.6 mg/dL (ref 8.4–10.5)
Chloride: 100 mEq/L (ref 96–112)
Creatinine, Ser: 1.14 mg/dL (ref 0.40–1.50)
GFR: 68.16 mL/min (ref >60.00)
Glucose, Bld: 133 mg/dL — ABNORMAL HIGH (ref 70–99)
Potassium: 3.9 mEq/L (ref 3.5–5.1)
Sodium: 142 mEq/L (ref 135–145)

## 2017-07-14 LAB — URINALYSIS, ROUTINE W REFLEX MICROSCOPIC
Bilirubin Urine: NEGATIVE
Hgb urine dipstick: NEGATIVE
Ketones, ur: NEGATIVE
Nitrite: NEGATIVE
Specific Gravity, Urine: 1.025 (ref 1.000–1.030)
Total Protein, Urine: NEGATIVE
Urine Glucose: 500 — AB
Urobilinogen, UA: 0.2 (ref 0.0–1.0)
pH: 5.5 (ref 5.0–8.0)

## 2017-07-14 LAB — HEMOGLOBIN A1C: Hgb A1c MFr Bld: 7.2 % — ABNORMAL HIGH (ref 4.6–6.5)

## 2017-07-14 NOTE — Patient Instructions (Signed)
Continue doing brain stimulating activities (puzzles, reading, adult coloring books, staying active) to keep memory sharp.   Continue to eat heart healthy diet (full of fruits, vegetables, whole grains, lean protein, water--limit salt, fat, and sugar intake) and increase physical activity as tolerated.   Cody Hale , Thank you for taking time to come for your Medicare Wellness Visit. I appreciate your ongoing commitment to your health goals. Please review the following plan we discussed and let me know if I can assist you in the future.   These are the goals we discussed: Goals    . Patient Stated     Trying to stay out of the hospital by being active possible and watch my diet by eating less, decrease carbohydrates and sugar. Stay socially active at the independent living facility, active in church, enjoy life, family and worship God.        This is a list of the screening recommended for you and due dates:  Health Maintenance  Topic Date Due  . Hemoglobin A1C  10/22/2017  . Eye exam for diabetics  10/24/2017  . Complete foot exam   04/23/2018  . Pneumonia vaccines (2 of 2 - PPSV23) 06/22/2018  . Colon Cancer Screening  06/15/2019  . Tetanus Vaccine  03/20/2023  . Flu Shot  Completed  .  Hepatitis C: One time screening is recommended by Center for Disease Control  (CDC) for  adults born from [birth year] through 1965.   Completed

## 2017-07-14 NOTE — Patient Instructions (Signed)

## 2017-07-14 NOTE — Telephone Encounter (Signed)
Patient seen in office today for follow up appointment with Dr. Yetta Barre. Oxycodone to be kept on as prn basis.

## 2017-07-14 NOTE — Progress Notes (Signed)
Subjective:  Patient ID: Cody Hale, male    DOB: 1951/06/04  Age: 67 y.o. MRN: [id number]  CC: Hypertension and Diabetes   HPI Cody Hale presents for f/up - He reports that his blood sugar and his blood pressures have been well controlled.  Other than some lingering musculoskeletal pain he feels well today and offers no complaints.  Outpatient Medications Prior to Visit  Medication Sig Dispense Refill  . canagliflozin (INVOKANA) 100 MG TABS tablet Take 1 tablet (100 mg total) by mouth daily before breakfast. 90 tablet 1  . carvedilol (COREG) 6.25 MG tablet TAKE 1 TABLET BY MOUTH TWICE DAILY. 60 tablet 11  . colchicine 0.6 MG tablet Take 1 tablet (0.6 mg total) by mouth 2 (two) times daily. 60 tablet 2  . fluticasone (FLONASE) 50 MCG/ACT nasal spray Place 2 sprays into both nostrils daily. 16 g 5  . insulin lispro (HUMALOG) 100 UNIT/ML KiwkPen Inject 0.05 mLs (5 Units total) into the skin 3 (three) times daily. 15 mL 11  . LANTUS SOLOSTAR 100 UNIT/ML Solostar Pen INJECT 58 UNITS SUBCUTANEOUSLY AT 10pm. 15 mL 3  . levocetirizine (XYZAL) 5 MG tablet TAKE 1 TABLET BY MOUTH EACH EVENING. 30 tablet 5  . metFORMIN (GLUCOPHAGE-XR) 750 MG 24 hr tablet TAKE 2 TABLETS BY MOUTH EACH MORNING WITH BREAKFAST. 60 tablet 5  . metolazone (ZAROXOLYN) 2.5 MG tablet TAKE ONE TABLET BY MOUTH EVERY OTHER DAY. 15 tablet 3  . Multiple Vitamin (DAILY VITE) TABS TAKE 1 TABLET BY MOUTH ONCE DAILY. 30 each 11  . nitroGLYCERIN (NITROSTAT) 0.4 MG SL tablet Place 1 tablet (0.4 mg total) under the tongue every 5 (five) minutes as needed for chest pain. Max 3 doses. 10 tablet 0  . NOVOFINE AUTOCOVER 30G X 8 MM MISC USE AS DIRECTED FOR INSULIN ADMINISTRATION. 100 each 11  . oxyCODONE (OXY IR/ROXICODONE) 5 MG immediate release tablet Take 1 tablet (5 mg total) by mouth 2 (two) times daily as needed for severe pain. 35 tablet 0  . simvastatin (ZOCOR) 20 MG tablet TAKE 1 TABLET BY MOUTH ONCE DAILY. 30 tablet 5  .  valsartan (DIOVAN) 40 MG tablet     . VIIBRYD 40 MG TABS TAKE 1 TABLET BY MOUTH ONCE DAILY. 30 tablet 11  . XARELTO 20 MG TABS tablet TAKE 1 TABLET BY MOUTH ONCE DAILY. 30 tablet 3   No facility-administered medications prior to visit.     ROS Review of Systems  Constitutional: Negative for diaphoresis and fatigue.  HENT: Negative.   Eyes: Negative.   Respiratory: Negative.  Negative for cough, chest tightness, shortness of breath and wheezing.   Cardiovascular: Negative for chest pain, palpitations and leg swelling.  Gastrointestinal: Negative.  Negative for abdominal pain, constipation, diarrhea, nausea and vomiting.  Endocrine: Negative.  Negative for polydipsia, polyphagia and polyuria.  Genitourinary: Negative.  Negative for dysuria.  Musculoskeletal: Positive for arthralgias. Negative for back pain, myalgias and neck pain.  Skin: Negative.  Negative for color change and rash.  Allergic/Immunologic: Negative.   Neurological: Negative.  Negative for dizziness, weakness, numbness and headaches.  Hematological: Negative for adenopathy. Does not bruise/bleed easily.  Psychiatric/Behavioral: Negative.     Objective:  BP (!) 142/74 (BP Location: Left Arm, Patient Position: Sitting, Cuff Size: Normal)   Pulse 77   Temp (!) 97.1 F (36.2 C) (Oral)   Ht 6' (1.829 m)   Wt 268 lb (121.6 kg)   SpO2 98%   BMI 36.35 kg/m  BP Readings from Last 3 Encounters:  07/14/17 (!) 142/74  07/14/17 (!) 142/74  05/05/17 140/68    Wt Readings from Last 3 Encounters:  07/14/17 268 lb (121.6 kg)  07/14/17 268 lb (121.6 kg)  04/23/17 272 lb (123.4 kg)    Physical Exam  Constitutional: He is oriented to person, place, and time. No distress.  HENT:  Mouth/Throat: Oropharynx is clear and moist. No oropharyngeal exudate.  Eyes: Conjunctivae are normal. Left eye exhibits no discharge. No scleral icterus.  Neck: Normal range of motion. Neck supple. No JVD present. No thyromegaly present.    Cardiovascular: Normal rate, regular rhythm and normal heart sounds. Exam reveals no gallop.  No murmur heard. Pulmonary/Chest: Effort normal and breath sounds normal. No respiratory distress. He has no wheezes. He has no rales.  Abdominal: Soft. Bowel sounds are normal. He exhibits no distension. There is no tenderness. There is no rebound and no guarding.  Musculoskeletal: Normal range of motion. He exhibits no edema, tenderness or deformity.  Lymphadenopathy:    He has no cervical adenopathy.  Neurological: He is alert and oriented to person, place, and time.  Skin: Skin is warm and dry. No rash noted. He is not diaphoretic. No erythema. No pallor.  Vitals reviewed.   Lab Results  Component Value Date   WBC 4.7 07/14/2017   HGB 16.0 07/14/2017   HCT 49.1 07/14/2017   PLT 110.0 Platelet estimate appears decreased (L) 07/14/2017   GLUCOSE 133 (H) 07/14/2017   CHOL 82 04/23/2017   TRIG 141.0 04/23/2017   HDL 34.10 (L) 04/23/2017   LDLDIRECT 84.2 01/31/2014   LDLCALC 20 04/23/2017   ALT 19 02/11/2017   AST 16 02/11/2017   NA 142 07/14/2017   K 3.9 07/14/2017   CL 100 07/14/2017   CREATININE 1.14 07/14/2017   BUN 23 07/14/2017   CO2 35 (H) 07/14/2017   TSH 0.80 12/31/2016   PSA 0.36 05/30/2014   INR 2.61 02/10/2017   HGBA1C 7.2 (H) 07/14/2017   MICROALBUR 4.0 (H) 07/14/2017    Dg Foot Complete Left  Result Date: 02/26/2017 CLINICAL DATA:  Swelling, painful for 4 days.  No known injury. EXAM: LEFT FOOT - COMPLETE 3+ VIEW COMPARISON:  None. FINDINGS: There is no evidence of fracture or dislocation. 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 There is a plantar calcaneal spur. There is enthesopathic change at the Achilles tendon insertion. There is soft tissue swelling along the dorsal aspect of the foot. IMPRESSION: No acute osseous injury of the left foot. Electronically Signed   By:  Elige Ko   On: 02/26/2017 11:26    Assessment & Plan:   [name] was seen today for hypertension and diabetes.  Diagnoses and all orders for this visit:  Type 2 diabetes mellitus with complication, with long-term current use of insulin (HCC)- His A1c is at 7.2%.  His blood sugars are adequately well controlled. -     Basic metabolic panel; Future -     Hemoglobin A1c; Future -     Microalbumin / creatinine urine ratio; Future  Essential hypertension, malignant- His blood pressure is well controlled.  Electrolytes and renal function are normal. -     Basic metabolic panel; Future -     Urinalysis, Routine w reflex microscopic; Future  Vitamin B12 deficiency anemia due to intrinsic factor deficiency- Will continue B12 replacement therapy. -     CBC with Differential/Platelet; Future  Morbid obesity (HCC)- He is working on his lifestyle modifications to lose weight.   I am having Tay D. Habel maintain his carvedilol, VIIBRYD, DAILY VITE, NOVOFINE AUTOCOVER, canagliflozin, levocetirizine, metFORMIN, metolazone, simvastatin, insulin lispro, colchicine, LANTUS SOLOSTAR, XARELTO, fluticasone, nitroGLYCERIN, valsartan, and oxyCODONE.  No orders of the defined types were placed in this encounter.    Follow-up: Return in about 6 months (around 01/11/2018).  Sanda Linger, MD

## 2017-07-15 ENCOUNTER — Encounter: Payer: Self-pay | Admitting: Internal Medicine

## 2017-07-18 ENCOUNTER — Telehealth: Payer: Self-pay | Admitting: Internal Medicine

## 2017-07-18 DIAGNOSIS — R300 Dysuria: Secondary | ICD-10-CM

## 2017-07-18 NOTE — Telephone Encounter (Signed)
Copied from CRM [id number]. Topic: Inquiry >> Jul 18, 2017 12:40 PM Everardo Pacific, [place] wrote: Reason for CRM: Lupita Leash called and would like to speak with Dr.Jones nurse about this patient. She stated that his lab work was sent over to them from the office,but the patient stated that it burns when he urinates. If someone could give them a call back at [phone]

## 2017-07-21 NOTE — Telephone Encounter (Signed)
Okay to give [facility] verbal to do UA with culture?

## 2017-07-22 NOTE — Telephone Encounter (Signed)
yes

## 2017-07-22 NOTE — Telephone Encounter (Signed)
Orders entered and faxed to [facility]. [phone]

## 2017-07-23 ENCOUNTER — Encounter: Payer: Self-pay | Admitting: Internal Medicine

## 2017-07-23 DIAGNOSIS — R300 Dysuria: Secondary | ICD-10-CM | POA: Diagnosis not present

## 2017-07-23 LAB — URINALYSIS
Bilirubin (Urine): NEGATIVE
Glucose: 3
Ketones, urine: NEGATIVE
Leukocytes, UA: NEGATIVE — AB
Nitrite, UA: NEGATIVE — AB
Occult Blood: NEGATIVE
Protein, UA: NEGATIVE — AB
Specific Gravity, Urine: 1.025
pH, Urine: 5

## 2017-07-23 LAB — BASIC METABOLIC PANEL: Glucose: 3

## 2017-07-25 ENCOUNTER — Other Ambulatory Visit: Payer: Self-pay | Admitting: Internal Medicine

## 2017-07-25 DIAGNOSIS — Z794 Long term (current) use of insulin: Principal | ICD-10-CM

## 2017-07-25 DIAGNOSIS — E118 Type 2 diabetes mellitus with unspecified complications: Secondary | ICD-10-CM

## 2017-07-25 MED ORDER — INSULIN GLARGINE 100 UNIT/ML SOLOSTAR PEN: 70.0000 [IU] | PEN_INJECTOR | Freq: Every day | SUBCUTANEOUS | 3 refills | Status: DC

## 2017-07-25 NOTE — Telephone Encounter (Signed)
Order faxed over to [facility] to increase Lantus to 70 units.

## 2017-07-25 NOTE — Telephone Encounter (Signed)
UA results received from [facility] all wnl except for the glucose. Glucose in urine was 3+. PCP informed of same - requested for BS readings:   1.18.19 am reading was 153 1.17.19 am reading was 127; noon reading was 113 and pm reading was 103 1.16.19 am reading was 138; noon reading was 114 and pm reading was 142  Highest reading in January was 193 on 1.8.19. This was an am reading.   Please advise if there are any changes recommended.

## 2017-07-25 NOTE — Telephone Encounter (Signed)
Increase his Lantus dose to 70 units a day

## 2017-07-28 ENCOUNTER — Ambulatory Visit: Payer: Self-pay

## 2017-07-28 ENCOUNTER — Telehealth: Payer: Self-pay | Admitting: Internal Medicine

## 2017-07-28 NOTE — Telephone Encounter (Signed)
Phone call from nurse at [facility].  Reported the pt. c/o left lower extremity redness, warmth, swelling and soreness, that started today; the pt. reported he noticed it, when he got up this AM.  Reported the swelling goes from the ankle to the knee.  Stated the pt. has had cellulitis in the past, and is a diabetic. Denied fever.  Denied any open sores or drainage from the left leg.  Reported mild swelling in bilateral lower legs, with left > right.  Per protocol, advised that the pt. will need an appt. today or tomorrow.  The nurse reported that it will take 5 days to line up transportation, and is asking if something can be ordered over the phone.  Advised that the MD typically will want to evaluate in the office, but will send a message to Dr. Yetta Barre.  Nurse, Lupita Leash, agreed with plan.      Reason for Disposition . [1] Red area or streak [2] large (> 2 in. or 5 cm)  Answer Assessment - Initial Assessment Questions 1. ONSET: "When did the swelling start?" (e.g., minutes, hours, days)     This AM.  2. LOCATION: "What part of the leg is swollen?"  "Are both legs swollen or just one leg?"     Left lower leg from ankle to the knee 3. SEVERITY: "How bad is the swelling?" (e.g., localized; mild, moderate, severe)  - Localized - small area of swelling localized to one leg  - MILD pedal edema - swelling limited to foot and ankle, pitting edema < 1/4 inch (6 mm) deep, rest and elevation eliminate most or all swelling  - MODERATE edema - swelling of lower leg to knee, pitting edema > 1/4 inch (6 mm) deep, rest and elevation only partially reduce swelling  - SEVERE edema - swelling extends above knee, facial or hand swelling present     Mild 4. REDNESS: "Does the swelling look red or infected?"     Red; nurse questions if pt. Has cellulitis 5. PAIN: "Is the swelling painful to touch?" If so, ask: "How painful is it?"   (Scale 1-10; mild, moderate or severe)     "Sore"  6. FEVER: "Do you have a fever?" If  so, ask: "What is it, how was it measured, and when did it start?"      No fever  7. CAUSE: "What do you think is causing the leg swelling?"     Unknown 8. MEDICAL HISTORY: "Do you have a history of heart failure, kidney disease, liver failure, or cancer?"     Diabetic; some heart problems; no liver disease or cancer 9. RECURRENT SYMPTOM: "Have you had leg swelling before?" If so, ask: "When was the last time?" "What happened that time?"     Yes, awhile ago per the nurse  10. OTHER SYMPTOMS: "Do you have any other symptoms?" (e.g., chest pain, difficulty breathing)      No sores/ drainage 11. PREGNANCY: "Is there any chance you are pregnant?" "When was your last menstrual period?"      n/a  Protocols used: LEG SWELLING AND EDEMA-A-AH

## 2017-07-28 NOTE — Telephone Encounter (Signed)
See triage note created 07/28/17.

## 2017-07-28 NOTE — Telephone Encounter (Signed)
Copied from CRM #[id number]. Topic: General - Other °>> Jul 28, 2017  3:33 PM Sebastian, Jennifer S wrote: °Reason for CRM: Nursing home is calling, Patient's left leg is red from the knee down, warm to touch. No fever. Please advise  ° °

## 2017-07-28 NOTE — Telephone Encounter (Signed)
Copied from CRM [id number]. Topic: General - Other >> Jul 28, 2017  3:33 PM Oneal Grout wrote: Reason for CRM: Nursing home is calling, Patient's left leg is red from the knee down, warm to touch. No fever. Please advise

## 2017-07-29 NOTE — Telephone Encounter (Signed)
Nursing home calling to check on status of antibiotic and also wanting to make the office aware that all meds need to be sent to [facility] not [facility].

## 2017-07-30 ENCOUNTER — Telehealth: Payer: Self-pay | Admitting: Internal Medicine

## 2017-07-30 ENCOUNTER — Encounter: Payer: Self-pay | Admitting: Internal Medicine

## 2017-07-30 NOTE — Telephone Encounter (Signed)
Faxed letter to transportation (again) and to Lupita Leash at [facility].  Contacted Lupita Leash at [facility] and informed of same.

## 2017-07-30 NOTE — Telephone Encounter (Signed)
Copied from CRM [id number]. Topic: General - Other >> Jul 30, 2017 11:59 AM Eston Mould B wrote: Lupita Leash, Floor supervisor at [facility] wants to know if she can get a letter from the office stating his transportation costs will be billed to Chauncy Lean can be reached at   [phone]  Lupita Leash is requesting the letter be faxed to [facility]  [phone] .  She says this has to be faxed quickly in order for [facility] to pick him up for his apt tomorrow.

## 2017-07-30 NOTE — Telephone Encounter (Signed)
Notified donna w/MD response. She states pt has a f/u appt tomorrow @ 2:30 he is needing something.Marland KitchenRaechel Chute

## 2017-07-30 NOTE — Telephone Encounter (Signed)
Left detailed message for Transportation department to confirm that they have received the letter of medical necessity.

## 2017-07-30 NOTE — Telephone Encounter (Signed)
His urine did not show any signs of infection

## 2017-07-30 NOTE — Telephone Encounter (Addendum)
Called LTC facility. Lupita Leash stated that pt is still experiencing symptoms. Requested Lupita Leash to have pt transported to ED, Lupita Leash requested that we send in abx for pt. I explained that we were not able to do that without seeing the patient first. Lupita Leash stated that if we send a letter to transportation that they could bring patient in tomorrow for an appt. Appt has been scheduled. Letter has been signed and faxed to [phone].  Calling transportation to confirm that they have received the letter for transport due to medical necessity.

## 2017-07-31 ENCOUNTER — Other Ambulatory Visit (INDEPENDENT_AMBULATORY_CARE_PROVIDER_SITE_OTHER): Payer: Medicare Other

## 2017-07-31 ENCOUNTER — Ambulatory Visit (INDEPENDENT_AMBULATORY_CARE_PROVIDER_SITE_OTHER): Payer: Medicare Other | Admitting: Internal Medicine

## 2017-07-31 ENCOUNTER — Encounter: Payer: Self-pay | Admitting: Internal Medicine

## 2017-07-31 VITALS — BP 164/90 | HR 72 | Temp 99.0°F | Resp 16 | Ht 72.0 in | Wt 274.0 lb

## 2017-07-31 DIAGNOSIS — M79661 Pain in right lower leg: Secondary | ICD-10-CM

## 2017-07-31 DIAGNOSIS — I872 Venous insufficiency (chronic) (peripheral): Secondary | ICD-10-CM | POA: Diagnosis not present

## 2017-07-31 LAB — CBC WITH DIFFERENTIAL/PLATELET
Basophils Absolute: 0 10*3/uL (ref 0.0–0.1)
Basophils Relative: 1.1 % (ref 0.0–3.0)
Eosinophils Absolute: 0.1 10*3/uL (ref 0.0–0.7)
Eosinophils Relative: 1.7 % (ref 0.0–5.0)
HCT: 45.2 % (ref 39.0–52.0)
Hemoglobin: 14.7 g/dL (ref 13.0–17.0)
Lymphocytes Relative: 18.6 % (ref 12.0–46.0)
Lymphs Abs: 0.7 10*3/uL (ref 0.7–4.0)
MCHC: 32.4 g/dL (ref 30.0–36.0)
MCV: 91 fl (ref 78.0–100.0)
Monocytes Absolute: 0.7 10*3/uL (ref 0.1–1.0)
Monocytes Relative: 17.6 % — ABNORMAL HIGH (ref 3.0–12.0)
Neutro Abs: 2.3 10*3/uL (ref 1.4–7.7)
Neutrophils Relative %: 61 % (ref 43.0–77.0)
Platelets: 108 10*3/uL — ABNORMAL LOW (ref 150.0–400.0)
RBC: 4.97 Mil/uL (ref 4.22–5.81)
RDW: 16.2 % — ABNORMAL HIGH (ref 11.5–15.5)
WBC: 3.8 10*3/uL — ABNORMAL LOW (ref 4.0–10.5)

## 2017-07-31 LAB — BASIC METABOLIC PANEL
BUN: 23 mg/dL (ref 6–23)
CO2: 35 mEq/L — ABNORMAL HIGH (ref 19–32)
Calcium: 9.5 mg/dL (ref 8.4–10.5)
Chloride: 99 mEq/L (ref 96–112)
Creatinine, Ser: 1.16 mg/dL (ref 0.40–1.50)
GFR: 66.8 mL/min (ref >60.00)
Glucose, Bld: 128 mg/dL — ABNORMAL HIGH (ref 70–99)
Potassium: 3.9 mEq/L (ref 3.5–5.1)
Sodium: 140 mEq/L (ref 135–145)

## 2017-07-31 MED ORDER — VASCULERA PO TABS: 1.0000 | ORAL_TABLET | Freq: Every day | ORAL | 11 refills | Status: DC

## 2017-07-31 NOTE — Progress Notes (Signed)
Subjective:  Patient ID: Cody Hale, male    DOB: [birth year]  Age: 67 y.o. MRN: [id number]  CC: Leg Pain   HPI Dezman D Goerner presents for a 5-day history of pain, redness, swelling in his right lower extremity.  He is not very active so he does not know if it is related to claudication or not.  The area does not feel warm to him and he denies fever or chills.  Outpatient Medications Prior to Visit  Medication Sig Dispense Refill  . canagliflozin (INVOKANA) 100 MG TABS tablet Take 1 tablet (100 mg total) by mouth daily before breakfast. 90 tablet 1  . carvedilol (COREG) 6.25 MG tablet TAKE 1 TABLET BY MOUTH TWICE DAILY. 60 tablet 11  . colchicine 0.6 MG tablet Take 1 tablet (0.6 mg total) by mouth 2 (two) times daily. 60 tablet 2  . fluticasone (FLONASE) 50 MCG/ACT nasal spray Place 2 sprays into both nostrils daily. 16 g 5  . Insulin Glargine (LANTUS SOLOSTAR) 100 UNIT/ML Solostar Pen Inject 70 Units into the skin daily at 10 pm. 15 mL 3  . insulin lispro (HUMALOG) 100 UNIT/ML KiwkPen Inject 0.05 mLs (5 Units total) into the skin 3 (three) times daily. 15 mL 11  . levocetirizine (XYZAL) 5 MG tablet TAKE 1 TABLET BY MOUTH EACH EVENING. 30 tablet 5  . metFORMIN (GLUCOPHAGE-XR) 750 MG 24 hr tablet TAKE 2 TABLETS BY MOUTH EACH MORNING WITH BREAKFAST. 60 tablet 5  . metolazone (ZAROXOLYN) 2.5 MG tablet TAKE ONE TABLET BY MOUTH EVERY OTHER DAY. 15 tablet 3  . Multiple Vitamin (DAILY VITE) TABS TAKE 1 TABLET BY MOUTH ONCE DAILY. 30 each 11  . NOVOFINE AUTOCOVER 30G X 8 MM MISC USE AS DIRECTED FOR INSULIN ADMINISTRATION. 100 each 11  . oxyCODONE (OXY IR/ROXICODONE) 5 MG immediate release tablet Take 1 tablet (5 mg total) by mouth 2 (two) times daily as needed for severe pain. 35 tablet 0  . simvastatin (ZOCOR) 20 MG tablet TAKE 1 TABLET BY MOUTH ONCE DAILY. 30 tablet 5  . valsartan (DIOVAN) 40 MG tablet     . VIIBRYD 40 MG TABS TAKE 1 TABLET BY MOUTH ONCE DAILY. 30 tablet 11  . XARELTO 20 MG  TABS tablet TAKE 1 TABLET BY MOUTH ONCE DAILY. 30 tablet 3  . nitroGLYCERIN (NITROSTAT) 0.4 MG SL tablet Place 1 tablet (0.4 mg total) under the tongue every 5 (five) minutes as needed for chest pain. Max 3 doses. (Patient not taking: Reported on 07/31/2017) 10 tablet 0   No facility-administered medications prior to visit.     ROS Review of Systems  Constitutional: Negative for chills, fatigue and fever.  HENT: Negative.   Eyes: Negative for visual disturbance.  Respiratory: Negative for cough, chest tightness, shortness of breath and wheezing.   Cardiovascular: Negative for chest pain, palpitations and leg swelling.  Gastrointestinal: Negative for abdominal pain, constipation, diarrhea, nausea and vomiting.  Endocrine: Negative.   Genitourinary: Negative.   Musculoskeletal: Positive for arthralgias. Negative for myalgias and neck pain.  Skin: Positive for color change. Negative for rash.  Allergic/Immunologic: Negative.   Neurological: Negative.  Negative for dizziness, weakness and numbness.  Hematological: Negative for adenopathy. Does not bruise/bleed easily.  Psychiatric/Behavioral: Negative.     Objective:  BP (!) 164/90 (BP Location: Left Arm, Patient Position: Sitting, Cuff Size: Large)   Pulse 72   Temp 99 F (37.2 C) (Oral)   Resp 16   Ht 6' (1.829 m)   [employer]  274 lb (124.3 kg)   SpO2 95%   BMI 37.16 kg/m   BP Readings from Last 3 Encounters:  07/31/17 (!) 164/90  07/14/17 (!) 142/74  07/14/17 (!) 142/74    Wt Readings from Last 3 Encounters:  07/31/17 274 lb (124.3 kg)  07/14/17 268 lb (121.6 kg)  07/14/17 268 lb (121.6 kg)    Physical Exam  Constitutional: No distress.  HENT:  Mouth/Throat: Oropharynx is clear and moist. No oropharyngeal exudate.  Eyes: Conjunctivae are normal. Left eye exhibits no discharge. No scleral icterus.  Neck: Normal range of motion. Neck supple. No JVD present. No thyromegaly present.  Cardiovascular: Normal rate and regular  rhythm. Exam reveals no gallop.  No murmur heard. Pulses:      Carotid pulses are 1+ on the right side, and 1+ on the left side.      Radial pulses are 1+ on the right side, and 1+ on the left side.       Femoral pulses are 1+ on the right side, and 1+ on the left side.      Popliteal pulses are 1+ on the right side, and 1+ on the left side.       Dorsalis pedis pulses are 1+ on the right side, and 1+ on the left side.       Posterior tibial pulses are 0 on the right side, and 1+ on the left side.  Pulmonary/Chest: Effort normal and breath sounds normal. No respiratory distress. He has no wheezes. He has no rales.  Abdominal: Soft. Bowel sounds are normal. He exhibits no mass. There is no tenderness. There is no guarding.  Musculoskeletal:       Right lower leg: He exhibits swelling and edema. He exhibits no tenderness, no bony tenderness and no deformity.       Legs: Skin: He is not diaphoretic.  Vitals reviewed.   Lab Results  Component Value Date   WBC 3.8 (L) 07/31/2017   HGB 14.7 07/31/2017   HCT 45.2 07/31/2017   PLT (L) 07/31/2017    108.0 Result may be falsely decreased due to platelet clumping.   GLUCOSE 128 (H) 07/31/2017   CHOL 82 04/23/2017   TRIG 141.0 04/23/2017   HDL 34.10 (L) 04/23/2017   LDLDIRECT 84.2 01/31/2014   LDLCALC 20 04/23/2017   ALT 19 02/11/2017   AST 16 02/11/2017   NA 140 07/31/2017   K 3.9 07/31/2017   CL 99 07/31/2017   CREATININE 1.16 07/31/2017   BUN 23 07/31/2017   CO2 35 (H) 07/31/2017   TSH 0.80 12/31/2016   PSA 0.36 05/30/2014   INR 2.61 02/10/2017   HGBA1C 7.2 (H) 07/14/2017   MICROALBUR 4.0 (H) 07/14/2017    Dg Foot Complete Left  Result Date: 02/26/2017 CLINICAL DATA:  Swelling, painful for 4 days.  No known injury. EXAM: LEFT FOOT - COMPLETE 3+ VIEW COMPARISON:  None. FINDINGS: There is no evidence of fracture or dislocation. 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 There is mild osteoarthritis of the  navicular- medial cuneiform joint. There is a plantar calcaneal spur. There is enthesopathic change at the Achilles tendon insertion. There is soft tissue swelling along the dorsal aspect of the foot. IMPRESSION: No acute osseous injury of the left foot. Electronically Signed   By: Elige Ko   On: 02/26/2017 11:26    Assessment & Plan:   Jacorius was seen today for leg pain.  Diagnoses and all orders for this visit:  Venous stasis dermatitis of right lower extremity- I think this is the most likely explanation for his symptoms and physical findings.  Will start vasculera to improve the venous blood flow and to prevent complications such as venous stasis and ulcers. -     Dietary Management Product (VASCULERA) TABS; Take 1 capsule by mouth daily.  Pain of right lower leg- There is no evidence of an infectious process.  His lactic acid level is normal so I do not think he has critical ischemia.  His d-dimer is normal so I am not concerned about a DVT.  I am concerned that his symptoms may be related to PAD so I have asked him to undergo an ABI as soon as possible. -     VAS [place] ABI WITH/WO TBI; Future -     D-dimer, quantitative (not at [facility]); Future -     CBC with Differential/Platelet; Future -     Basic metabolic panel; Future -     Lactic acid, plasma; Future   I am having Kden D. Skow start on [place]. I am also having him maintain his carvedilol, VIIBRYD, DAILY VITE, NOVOFINE AUTOCOVER, canagliflozin, levocetirizine, metFORMIN, metolazone, simvastatin, insulin lispro, colchicine, XARELTO, fluticasone, nitroGLYCERIN, valsartan, oxyCODONE, and Insulin Glargine.  Meds ordered this encounter  Medications  . Dietary Management Product (VASCULERA) TABS    Sig: Take 1 capsule by mouth daily.    Dispense:  30 tablet    Refill:  11     Follow-up: Return in about 3 weeks (around 08/21/2017).  Sanda Linger, MD

## 2017-07-31 NOTE — Patient Instructions (Signed)
Chronic Venous Insufficiency Chronic venous insufficiency, also called venous stasis, is a condition that prevents blood from being pumped effectively through the veins in your legs. Blood may no longer be pumped effectively from the legs back to the heart. This condition can range from mild to severe. With proper treatment, you should be able to continue with an active life. What are the causes? Chronic venous insufficiency occurs when the vein walls become stretched, weakened, or damaged, or when valves within the vein are damaged. Some common causes of this include:  High blood pressure inside the veins (venous hypertension).  Increased blood pressure in the leg veins from long periods of sitting or standing.  A blood clot that blocks blood flow in a vein (deep vein thrombosis, DVT).  Inflammation of a vein (phlebitis) that causes a blood clot to form.  Tumors in the pelvis that cause blood to back up.  What increases the risk? The following factors may make you more likely to develop this condition:  Having a family history of this condition.  Obesity.  Pregnancy.  Living without enough physical activity or exercise (sedentary lifestyle).  Smoking.  Having a job that requires long periods of standing or sitting in one place.  Being a certain age. Women in their 40s and 50s and men in their 70s are more likely to develop this condition.  What are the signs or symptoms? Symptoms of this condition include:  Veins that are enlarged, bulging, or twisted (varicose veins).  Skin breakdown or ulcers.  Reddened or discolored skin on the front of the leg.  Brown, smooth, tight, and painful skin just above the ankle, usually on the inside of the leg (lipodermatosclerosis).  Swelling.  How is this diagnosed? This condition may be diagnosed based on:  Your medical history.  A physical exam.  Tests, such as: ? A procedure that creates an image of a blood vessel and nearby  organs and provides information about blood flow through the blood vessel (duplex ultrasound). ? A procedure that tests blood flow (plethysmography). ? A procedure to look at the veins using X-ray and dye (venogram).  How is this treated? The goals of treatment are to help you return to an active life and to minimize pain or disability. Treatment depends on the severity of your condition, and it may include:  Wearing compression stockings. These can help relieve symptoms and help prevent your condition from getting worse. However, they do not cure the condition.  Sclerotherapy. This is a procedure involving an injection of a material that "dissolves" damaged veins.  Surgery. This may involve: ? Removing a diseased vein (vein stripping). ? Cutting off blood flow through the vein (laser ablation surgery). ? Repairing a valve.  Follow these instructions at home:  Wear compression stockings as told by your health care provider. These stockings help to prevent blood clots and reduce swelling in your legs.  Take over-the-counter and prescription medicines only as told by your health care provider.  Stay active by exercising, walking, or doing different activities. Ask your health care provider what activities are safe for you and how much exercise you need.  Drink enough fluid to keep your urine clear or pale yellow.  Do not use any products that contain nicotine or tobacco, such as cigarettes and e-cigarettes. If you need help quitting, ask your health care provider.  Keep all follow-up visits as told by your health care provider. This is important. Contact a health care provider if:  You   have redness, swelling, or more pain in the affected area.  You see a red streak or line that extends up or down from the affected area.  You have skin breakdown or a loss of skin in the affected area, even if the breakdown is small.  You get an injury in the affected area. Get help right away  if:  You get an injury and an open wound in the affected area.  You have severe pain that does not get better with medicine.  You have sudden numbness or weakness in the foot or ankle below the affected area, or you have trouble moving your foot or ankle.  You have a fever and you have worse or persistent symptoms.  You have chest pain.  You have shortness of breath. Summary  Chronic venous insufficiency, also called venous stasis, is a condition that prevents blood from being pumped effectively through the veins in your legs.  Chronic venous insufficiency occurs when the vein walls become stretched, weakened, or damaged, or when valves within the vein are damaged.  Treatment for this condition depends on how severe your condition is, and it may involve wearing compression stockings or having a procedure.  Make sure you stay active by exercising, walking, or doing different activities. Ask your health care provider what activities are safe for you and how much exercise you need. This information is not intended to replace advice given to you by your health care provider. Make sure you discuss any questions you have with your health care provider. Document Released: 10/28/2006 Document Revised: 05/13/2016 Document Reviewed: 05/13/2016 Elsevier Interactive Patient Education  2017 Elsevier Inc.  

## 2017-08-01 LAB — D-DIMER, QUANTITATIVE: D-Dimer, Quant: 0.24 mcg/mL FEU (ref <0.50)

## 2017-08-01 LAB — LACTIC ACID, PLASMA: LACTIC ACID: 1 mmol/L (ref 0.4–1.8)

## 2017-08-05 ENCOUNTER — Encounter: Payer: Self-pay | Admitting: Internal Medicine

## 2017-08-05 NOTE — Progress Notes (Signed)
Outside notes received. Information abstracted. Notes sent to scan.  

## 2017-08-12 ENCOUNTER — Telehealth: Payer: Self-pay

## 2017-08-12 NOTE — Telephone Encounter (Addendum)
PA for invokana 100 mg tablet Key: [id number]

## 2017-08-13 ENCOUNTER — Ambulatory Visit (HOSPITAL_COMMUNITY)
Admission: RE | Admit: 2017-08-13 | Discharge: 2017-08-13 | Disposition: A | Discharge status: Home / Self Care | Payer: Medicare Other | Source: Ambulatory Visit | Attending: Internal Medicine | Admitting: Internal Medicine

## 2017-08-13 DIAGNOSIS — Z87891 Personal history of nicotine dependence: Secondary | ICD-10-CM | POA: Insufficient documentation

## 2017-08-13 DIAGNOSIS — E785 Hyperlipidemia, unspecified: Secondary | ICD-10-CM | POA: Insufficient documentation

## 2017-08-13 DIAGNOSIS — E119 Type 2 diabetes mellitus without complications: Secondary | ICD-10-CM | POA: Diagnosis not present

## 2017-08-13 DIAGNOSIS — I10 Essential (primary) hypertension: Secondary | ICD-10-CM | POA: Diagnosis not present

## 2017-08-13 DIAGNOSIS — M79661 Pain in right lower leg: Secondary | ICD-10-CM | POA: Diagnosis not present

## 2017-08-18 ENCOUNTER — Telehealth: Payer: Self-pay | Admitting: Internal Medicine

## 2017-08-18 NOTE — Telephone Encounter (Signed)
Copied from CRM [id number]. Topic: General - Other >> Aug 18, 2017  3:10 PM Gerrianne Scale wrote: Reason for CRM: Lupita Leash from [facility] [phone] calling because patient hasn't had any oxyCODONE (OXY IR/ROXICODONE) 5 MG immediate release tablet for 3 days now and he is really in pain please send it to the [facility] - [place], [place] - [address] [phone] (Phone) [phone] (Fax)

## 2017-08-19 ENCOUNTER — Telehealth: Payer: Self-pay | Admitting: Internal Medicine

## 2017-08-19 NOTE — Telephone Encounter (Signed)
Copied from CRM [id number]. Topic: Quick Communication - See Telephone Encounter >> Aug 19, 2017  3:43 PM Arlyss Gandy, NT wrote: CRM for notification. See Telephone encounter for: Pt calling to get results from the vein test he had done last week. Not sure if results are back, did not see them in his chart.  08/19/17.

## 2017-08-20 ENCOUNTER — Other Ambulatory Visit: Payer: Self-pay | Admitting: Internal Medicine

## 2017-08-20 DIAGNOSIS — M150 Primary generalized (osteo)arthritis: Principal | ICD-10-CM

## 2017-08-20 DIAGNOSIS — M159 Polyosteoarthritis, unspecified: Secondary | ICD-10-CM

## 2017-08-20 DIAGNOSIS — M8949 Other hypertrophic osteoarthropathy, multiple sites: Secondary | ICD-10-CM

## 2017-08-20 MED ORDER — OXYCODONE HCL 5 MG PO TABS: 5.0000 mg | ORAL_TABLET | Freq: Two times a day (BID) | ORAL | 0 refills | Status: DC | PRN

## 2017-08-20 NOTE — Telephone Encounter (Signed)
Contacted pt and informed of test results and refaxed to [facility]

## 2017-08-20 NOTE — Telephone Encounter (Addendum)
PA for lantus Key: [id number]

## 2017-08-20 NOTE — Telephone Encounter (Signed)
Pt is rq refill of oxycodone. Please advise.

## 2017-08-20 NOTE — Telephone Encounter (Signed)
Contact pt w/ the results of the vein test, pt is wanting to know the results

## 2017-08-22 ENCOUNTER — Encounter: Payer: Self-pay | Admitting: Cardiovascular Disease

## 2017-08-22 ENCOUNTER — Ambulatory Visit (INDEPENDENT_AMBULATORY_CARE_PROVIDER_SITE_OTHER): Payer: Medicare Other | Admitting: Cardiovascular Disease

## 2017-08-22 VITALS — BP 138/78 | HR 61 | Ht 69.0 in | Wt 273.0 lb

## 2017-08-22 DIAGNOSIS — R600 Localized edema: Secondary | ICD-10-CM | POA: Diagnosis not present

## 2017-08-22 DIAGNOSIS — I481 Persistent atrial fibrillation: Secondary | ICD-10-CM

## 2017-08-22 DIAGNOSIS — I10 Essential (primary) hypertension: Secondary | ICD-10-CM | POA: Diagnosis not present

## 2017-08-22 DIAGNOSIS — Z952 Presence of prosthetic heart valve: Secondary | ICD-10-CM | POA: Diagnosis not present

## 2017-08-22 DIAGNOSIS — I5022 Chronic systolic (congestive) heart failure: Secondary | ICD-10-CM

## 2017-08-22 DIAGNOSIS — I4819 Other persistent atrial fibrillation: Secondary | ICD-10-CM

## 2017-08-22 DIAGNOSIS — E7800 Pure hypercholesterolemia, unspecified: Secondary | ICD-10-CM

## 2017-08-22 DIAGNOSIS — Z01818 Encounter for other preprocedural examination: Secondary | ICD-10-CM | POA: Diagnosis not present

## 2017-08-22 DIAGNOSIS — Z79899 Other long term (current) drug therapy: Secondary | ICD-10-CM | POA: Diagnosis not present

## 2017-08-22 MED ORDER — TORSEMIDE 20 MG PO TABS: 20.0000 mg | ORAL_TABLET | Freq: Every day | ORAL | 3 refills | Status: DC

## 2017-08-22 NOTE — Patient Instructions (Addendum)
Your physician wants you to follow-up in:6 weeks with Dr.Koneswaran      STOP Metolazone   START Torsemide 20 mg daily   Get lab work:BMET in 5 days    No testing ordered today      Thank you for choosing [facility] !
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
## 2017-08-25 NOTE — Telephone Encounter (Signed)
Caller name: [facility] in [place] Can be reached: [phone] Pharmacy: [facility], [place] - [address] [phone] (Phone) [phone] (Fax)   Reason for call: Pt will be out of invokana tomorrow. Can RX be changed or update on PA to pharmacy. Please advise.

## 2017-08-26 ENCOUNTER — Telehealth: Payer: Self-pay

## 2017-08-26 DIAGNOSIS — M79674 Pain in right toe(s): Secondary | ICD-10-CM | POA: Diagnosis not present

## 2017-08-26 DIAGNOSIS — M79675 Pain in left toe(s): Secondary | ICD-10-CM | POA: Diagnosis not present

## 2017-08-26 DIAGNOSIS — B351 Tinea unguium: Secondary | ICD-10-CM | POA: Diagnosis not present

## 2017-08-26 NOTE — Telephone Encounter (Signed)
Prior auth started today for Invokana. Key is [id number].  Done online with cover my meds but it was faxed in to insurance company today,

## 2017-08-27 DIAGNOSIS — Z79899 Other long term (current) drug therapy: Secondary | ICD-10-CM | POA: Diagnosis not present

## 2017-08-27 LAB — BASIC METABOLIC PANEL
BUN/Creatinine Ratio: 22 (calc) (ref 6–22)
BUN: 27 mg/dL — ABNORMAL HIGH (ref 7–25)
CO2: 33 mmol/L — ABNORMAL HIGH (ref 20–32)
Calcium: 9.7 mg/dL (ref 8.6–10.3)
Chloride: 99 mmol/L (ref 98–110)
Creat: 1.23 mg/dL (ref 0.70–1.25)
Glucose, Bld: 113 mg/dL (ref 65–139)
Potassium: 3.4 mmol/L — ABNORMAL LOW (ref 3.5–5.3)
Sodium: 146 mmol/L (ref 135–146)

## 2017-08-28 ENCOUNTER — Telehealth: Payer: Self-pay

## 2017-08-28 ENCOUNTER — Telehealth: Payer: Self-pay | Admitting: *Deleted

## 2017-08-28 DIAGNOSIS — Z79899 Other long term (current) drug therapy: Secondary | ICD-10-CM

## 2017-08-28 MED ORDER — POTASSIUM CHLORIDE ER 10 MEQ PO TBCR: 10.0000 meq | EXTENDED_RELEASE_TABLET | Freq: Every day | ORAL | 3 refills | Status: DC

## 2017-08-28 NOTE — Telephone Encounter (Signed)
-----   Message from Laqueta Linden, MD sent at 08/28/2017  9:41 AM EST ----- K mildly low. Start KCl 10 meq daily and repeat BMET in one week.

## 2017-08-28 NOTE — Telephone Encounter (Signed)
Called and spoke with Cody Hale today from [facility] regarding status of Invokana 100MG  tablets. Per Cody Hale script was approved and pharmacy has already ran script through.

## 2017-08-28 NOTE — Telephone Encounter (Signed)
PA for invokana has been approved. Pharmacy informed of same.

## 2017-09-01 ENCOUNTER — Telehealth: Payer: Self-pay | Admitting: Internal Medicine

## 2017-09-01 DIAGNOSIS — I872 Venous insufficiency (chronic) (peripheral): Secondary | ICD-10-CM

## 2017-09-01 NOTE — Telephone Encounter (Signed)
This has already been sent. Will send Lupita Leash a fax regarding the same.

## 2017-09-01 NOTE — Telephone Encounter (Signed)
Copied from CRM [id number]. Topic: Quick Communication - See Telephone Encounter >> Sep 01, 2017  9:40 AM Lelon Frohlich, RMA wrote: CRM for notification. See Telephone encounter for:   09/01/17.Lupita Leash from [facility] called requesting vasculara 630 mg 1 tablet daily sent to [facility] she stated that pt was given a sample during last visit and that is working so pt now needs a rx sent to his pharmacy Lupita Leash  can be reached [phone] 

## 2017-09-02 ENCOUNTER — Telehealth: Payer: Self-pay | Admitting: Internal Medicine

## 2017-09-02 NOTE — Telephone Encounter (Signed)
Copied from CRM [id number]. Topic: Quick Communication - Rx Refill/Question >> Sep 02, 2017  4:18 PM Stephannie Li, NT wrote: Medication: vascular Has the patient contacted their pharmacy? {yes  (Agent: If no, request that the patient contact the pharmacy for the refill. Preferred Pharmacy (with phone number or street name): [facility] - , [place] - [address] [phone] (Phone) [phone] (Fax) Agent: Please be advised that RX refills may take up to 3 business days. We ask that you follow-up with your pharmacy. Patient was given a sample of the above medication that was sent to the wrong pharmacy ,this needs to go to the above pharmacy

## 2017-09-03 NOTE — Telephone Encounter (Signed)
Called [facility] and spoke to site Production designer, theatre/television/film and Lupita Leash. Informed that rx for [place] is sent to a specialty pharmacy due to cost for other pharmacies. They were concerned about getting in the mail and wanted me to do a PA. They contacted their pharmacy [facility]) and confirmed information that I gave them. They stated that [facility] could ship directly to them as long as they have a complete sig and patient information.   Contacted [facility] and informed that [facility] will receive the package. They will get patients medication together and it should ship to pt within 5-8 business days.

## 2017-09-03 NOTE — Telephone Encounter (Signed)
Pt requesting the medication vascular. He stated that he had samples and would like to continue taking this medication.  Provider: Dr. Sanda Linger  Pharmacy: [facility], [place] [address]  LOV  07/31/17 NOV  01/12/18  Please review.

## 2017-09-09 ENCOUNTER — Other Ambulatory Visit: Payer: Self-pay | Admitting: Internal Medicine

## 2017-09-09 DIAGNOSIS — Z794 Long term (current) use of insulin: Principal | ICD-10-CM

## 2017-09-09 DIAGNOSIS — E118 Type 2 diabetes mellitus with unspecified complications: Secondary | ICD-10-CM

## 2017-09-10 DIAGNOSIS — Z79899 Other long term (current) drug therapy: Secondary | ICD-10-CM | POA: Diagnosis not present

## 2017-09-10 LAB — BASIC METABOLIC PANEL
BUN: 18 mg/dL (ref 7–25)
CO2: 38 mmol/L — ABNORMAL HIGH (ref 20–32)
Calcium: 9.3 mg/dL (ref 8.6–10.3)
Chloride: 103 mmol/L (ref 98–110)
Creat: 1.13 mg/dL (ref 0.70–1.25)
Glucose, Bld: 96 mg/dL (ref 65–139)
Potassium: 4.2 mmol/L (ref 3.5–5.3)
Sodium: 145 mmol/L (ref 135–146)

## 2017-09-11 ENCOUNTER — Other Ambulatory Visit: Payer: Self-pay | Admitting: Internal Medicine

## 2017-09-11 DIAGNOSIS — M150 Primary generalized (osteo)arthritis: Principal | ICD-10-CM

## 2017-09-11 DIAGNOSIS — M159 Polyosteoarthritis, unspecified: Secondary | ICD-10-CM

## 2017-09-11 DIAGNOSIS — M8949 Other hypertrophic osteoarthropathy, multiple sites: Secondary | ICD-10-CM

## 2017-09-11 NOTE — Telephone Encounter (Signed)
LOV: 07/31/17  Dr. Azucena Kuba

## 2017-09-11 NOTE — Telephone Encounter (Signed)
Copied from CRM [id number]. Topic: Quick Communication - Rx Refill/Question >> Sep 11, 2017  2:57 PM Clack, Princella Pellegrini wrote: Medication:  oxyCODONE (OXY IR/ROXICODONE) 5 MG immediate release tablet [[id number]]    Has the patient contacted their pharmacy? Yes.     (Agent: If no, request that the patient contact the pharmacy for the refill.)   Preferred Pharmacy (with phone number or street name): [facility] - [place], [place] - [address] [phone] (Phone) [phone] (Fax)     Agent: Please be advised that RX refills may take up to 3 business days. We ask that you follow-up with your pharmacy.

## 2017-09-12 ENCOUNTER — Other Ambulatory Visit: Payer: Self-pay | Admitting: Internal Medicine

## 2017-09-12 DIAGNOSIS — M159 Polyosteoarthritis, unspecified: Secondary | ICD-10-CM

## 2017-09-12 DIAGNOSIS — M150 Primary generalized (osteo)arthritis: Principal | ICD-10-CM

## 2017-09-12 DIAGNOSIS — M8949 Other hypertrophic osteoarthropathy, multiple sites: Secondary | ICD-10-CM

## 2017-09-12 MED ORDER — OXYCODONE HCL 5 MG PO TABS: 5.0000 mg | ORAL_TABLET | Freq: Two times a day (BID) | ORAL | 0 refills | Status: DC | PRN

## 2017-09-12 NOTE — Telephone Encounter (Signed)
Check [place] registry last filled 08/20/2017.Marland KitchenRaechel Chute

## 2017-09-15 ENCOUNTER — Telehealth: Payer: Self-pay | Admitting: Internal Medicine

## 2017-09-15 NOTE — Telephone Encounter (Signed)
Please advise since Vernona Rieger is out on vacation.  Thanks

## 2017-09-15 NOTE — Telephone Encounter (Signed)
Copied from CRM [id number]. Topic: Quick Communication - See Telephone Encounter >> Sep 15, 2017 11:28 AM Windy Kalata, NT wrote: CRM for notification. See Telephone encounter for:  09/15/17.  Lupita Leash is calling from [facility] and states the patient is having head congestion. She believes it is a upper respiratory infection and is requesting medication called in for the patient. States he is not running a fever at this time. Please advise. CB# [phone]  [facility], [place] - [address]  [address] [place] [address]  Phone: [phone] Fax: [phone]

## 2017-09-17 ENCOUNTER — Ambulatory Visit (INDEPENDENT_AMBULATORY_CARE_PROVIDER_SITE_OTHER): Payer: Medicare Other | Admitting: Orthopaedic Surgery

## 2017-09-17 NOTE — Telephone Encounter (Signed)
Hi Dr. Yetta Barre,  Would you be willing to address this message since Vernona Rieger is out on vacation.  Thanks

## 2017-09-17 NOTE — Telephone Encounter (Signed)
Called this morning and spoke to Annetta North. She stated patient was actually feeling better and no need for visit at this point. She has been advised that if patient starts to feel bad again to contact office back for appointment.

## 2017-09-17 NOTE — Telephone Encounter (Signed)
He needs to be seen and evaluated for pneumonia with a chest x-ray

## 2017-09-18 ENCOUNTER — Other Ambulatory Visit: Payer: Self-pay | Admitting: Internal Medicine

## 2017-09-18 DIAGNOSIS — Z794 Long term (current) use of insulin: Principal | ICD-10-CM

## 2017-09-18 DIAGNOSIS — E118 Type 2 diabetes mellitus with unspecified complications: Secondary | ICD-10-CM

## 2017-09-22 ENCOUNTER — Telehealth: Payer: Self-pay | Admitting: Internal Medicine

## 2017-09-22 NOTE — Telephone Encounter (Unsigned)
Copied from CRM [id number]. Topic: Quick Communication - Rx Refill/Question >> Sep 22, 2017  3:37 PM Percival Spanish wrote: Lupita Leash with [facility] call to req a refill on the below med     ph [phone]  Medication  oxyCODONE (OXY IR/ROXICODONE) 5 MG immediate release tablet  Preferred Pharmacy  [facility] [place]      [phone]    Agent: Please be advised that RX refills may take up to 3 business days. We ask that you follow-up with your pharmacy.

## 2017-09-23 ENCOUNTER — Other Ambulatory Visit: Payer: Self-pay | Admitting: Internal Medicine

## 2017-09-23 DIAGNOSIS — M159 Polyosteoarthritis, unspecified: Secondary | ICD-10-CM

## 2017-09-23 DIAGNOSIS — M8949 Other hypertrophic osteoarthropathy, multiple sites: Secondary | ICD-10-CM

## 2017-09-23 DIAGNOSIS — M150 Primary generalized (osteo)arthritis: Principal | ICD-10-CM

## 2017-09-23 MED ORDER — OXYCODONE HCL 5 MG PO TABS: 5.0000 mg | ORAL_TABLET | Freq: Two times a day (BID) | ORAL | 0 refills | Status: DC | PRN

## 2017-09-23 NOTE — Telephone Encounter (Signed)
LOV: 07/31/17  Dr. Yetta Barre  [facility]- [place], [place]

## 2017-09-23 NOTE — Telephone Encounter (Signed)
Last rx for oxy was sent to [facility]. Can you resend to [facility] in [place]

## 2017-10-01 ENCOUNTER — Encounter (INDEPENDENT_AMBULATORY_CARE_PROVIDER_SITE_OTHER): Payer: Self-pay | Admitting: Orthopaedic Surgery

## 2017-10-01 ENCOUNTER — Ambulatory Visit (INDEPENDENT_AMBULATORY_CARE_PROVIDER_SITE_OTHER): Payer: Medicare Other | Admitting: Orthopaedic Surgery

## 2017-10-01 DIAGNOSIS — M1611 Unilateral primary osteoarthritis, right hip: Secondary | ICD-10-CM | POA: Diagnosis not present
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
## 2017-10-07 ENCOUNTER — Encounter: Payer: Self-pay | Admitting: Cardiovascular Disease

## 2017-10-07 ENCOUNTER — Ambulatory Visit (INDEPENDENT_AMBULATORY_CARE_PROVIDER_SITE_OTHER): Payer: Medicare Other | Admitting: Cardiovascular Disease

## 2017-10-07 VITALS — BP 136/62 | HR 61 | Ht 69.0 in | Wt 279.0 lb

## 2017-10-07 DIAGNOSIS — I482 Chronic atrial fibrillation, unspecified: Secondary | ICD-10-CM

## 2017-10-07 DIAGNOSIS — E7800 Pure hypercholesterolemia, unspecified: Secondary | ICD-10-CM

## 2017-10-07 DIAGNOSIS — Z79899 Other long term (current) drug therapy: Secondary | ICD-10-CM

## 2017-10-07 DIAGNOSIS — I10 Essential (primary) hypertension: Secondary | ICD-10-CM

## 2017-10-07 DIAGNOSIS — Z952 Presence of prosthetic heart valve: Secondary | ICD-10-CM | POA: Diagnosis not present

## 2017-10-07 DIAGNOSIS — R600 Localized edema: Secondary | ICD-10-CM

## 2017-10-07 DIAGNOSIS — I5023 Acute on chronic systolic (congestive) heart failure: Secondary | ICD-10-CM | POA: Diagnosis not present

## 2017-10-07 MED ORDER — METOLAZONE 2.5 MG PO TABS: ORAL_TABLET | ORAL | 0 refills | Status: DC

## 2017-10-07 MED ORDER — TORSEMIDE 20 MG PO TABS: 20.0000 mg | ORAL_TABLET | Freq: Two times a day (BID) | ORAL | 3 refills | Status: DC

## 2017-10-07 MED ORDER — POTASSIUM CHLORIDE CRYS ER 20 MEQ PO TBCR: 20.0000 meq | EXTENDED_RELEASE_TABLET | Freq: Every day | ORAL | 3 refills | Status: DC
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
## 2017-10-07 NOTE — Patient Instructions (Addendum)
Your physician wants you to follow-up in:6 weeks with Dr Purvis Sheffield     INCREASE Torsemide to 20 mg twice a day   INCREASE Potassium to 20 meq daily    Take Metolazone 2.5 mg daily for 2 days and then STOP     Get lab work in 3 days and AGAIN in 10 days:BMET    Thank you for choosing [facility] !

## 2017-10-10 DIAGNOSIS — Z79899 Other long term (current) drug therapy: Secondary | ICD-10-CM | POA: Diagnosis not present

## 2017-10-10 LAB — BASIC METABOLIC PANEL
BUN/Creatinine Ratio: 24 (calc) — ABNORMAL HIGH (ref 6–22)
BUN: 32 mg/dL — ABNORMAL HIGH (ref 7–25)
CO2: 36 mmol/L — ABNORMAL HIGH (ref 20–32)
Calcium: 9.8 mg/dL (ref 8.6–10.3)
Chloride: 96 mmol/L — ABNORMAL LOW (ref 98–110)
Creat: 1.35 mg/dL — ABNORMAL HIGH (ref 0.70–1.25)
Glucose, Bld: 92 mg/dL (ref 65–139)
Potassium: 3.8 mmol/L (ref 3.5–5.3)
Sodium: 142 mmol/L (ref 135–146)

## 2017-10-16 ENCOUNTER — Telehealth: Payer: Self-pay | Admitting: Internal Medicine

## 2017-10-16 IMAGING — DX DG ABDOMEN 1V
3 series · 3 of 3 positions shown · non-contrast
Comparison: None.

CLINICAL DATA: Abdominal pain and distention.

EXAM:
ABDOMEN - 1 VIEW

[abdomen kub (1 of 3)]
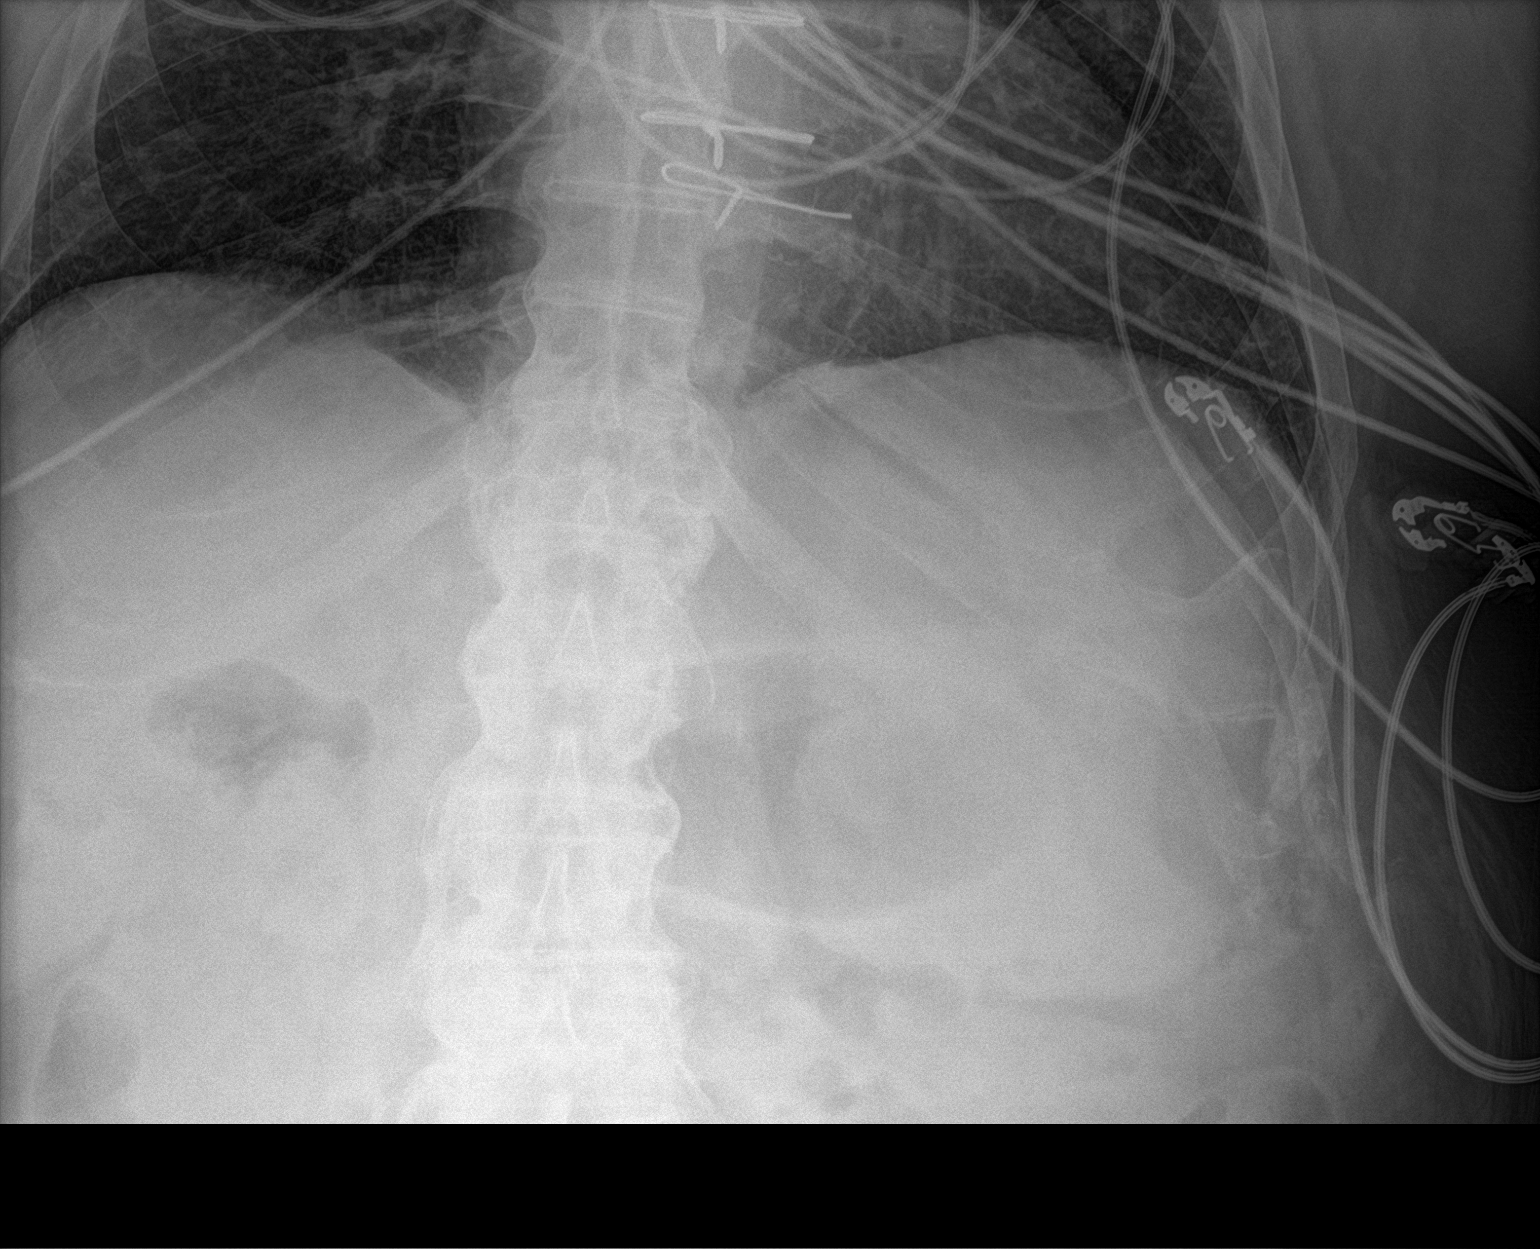

[abdomen kub (2 of 3)]
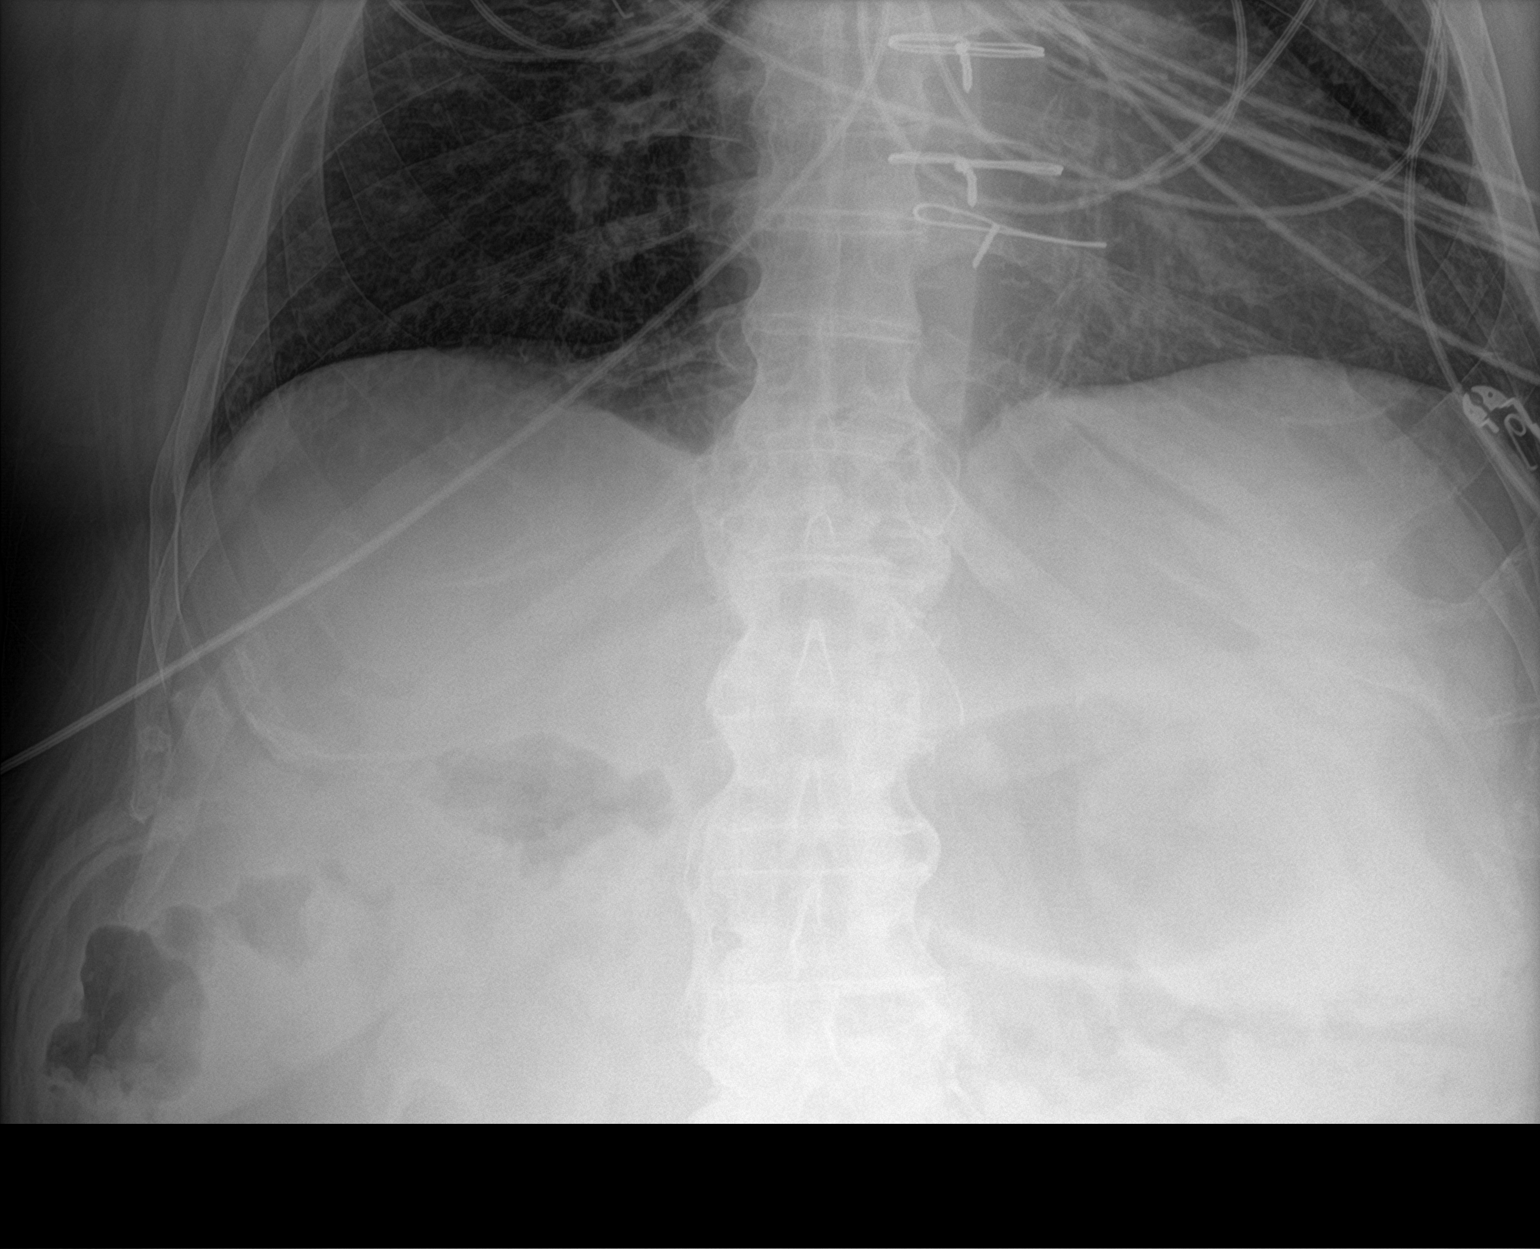

[abdomen kub (3 of 3)]
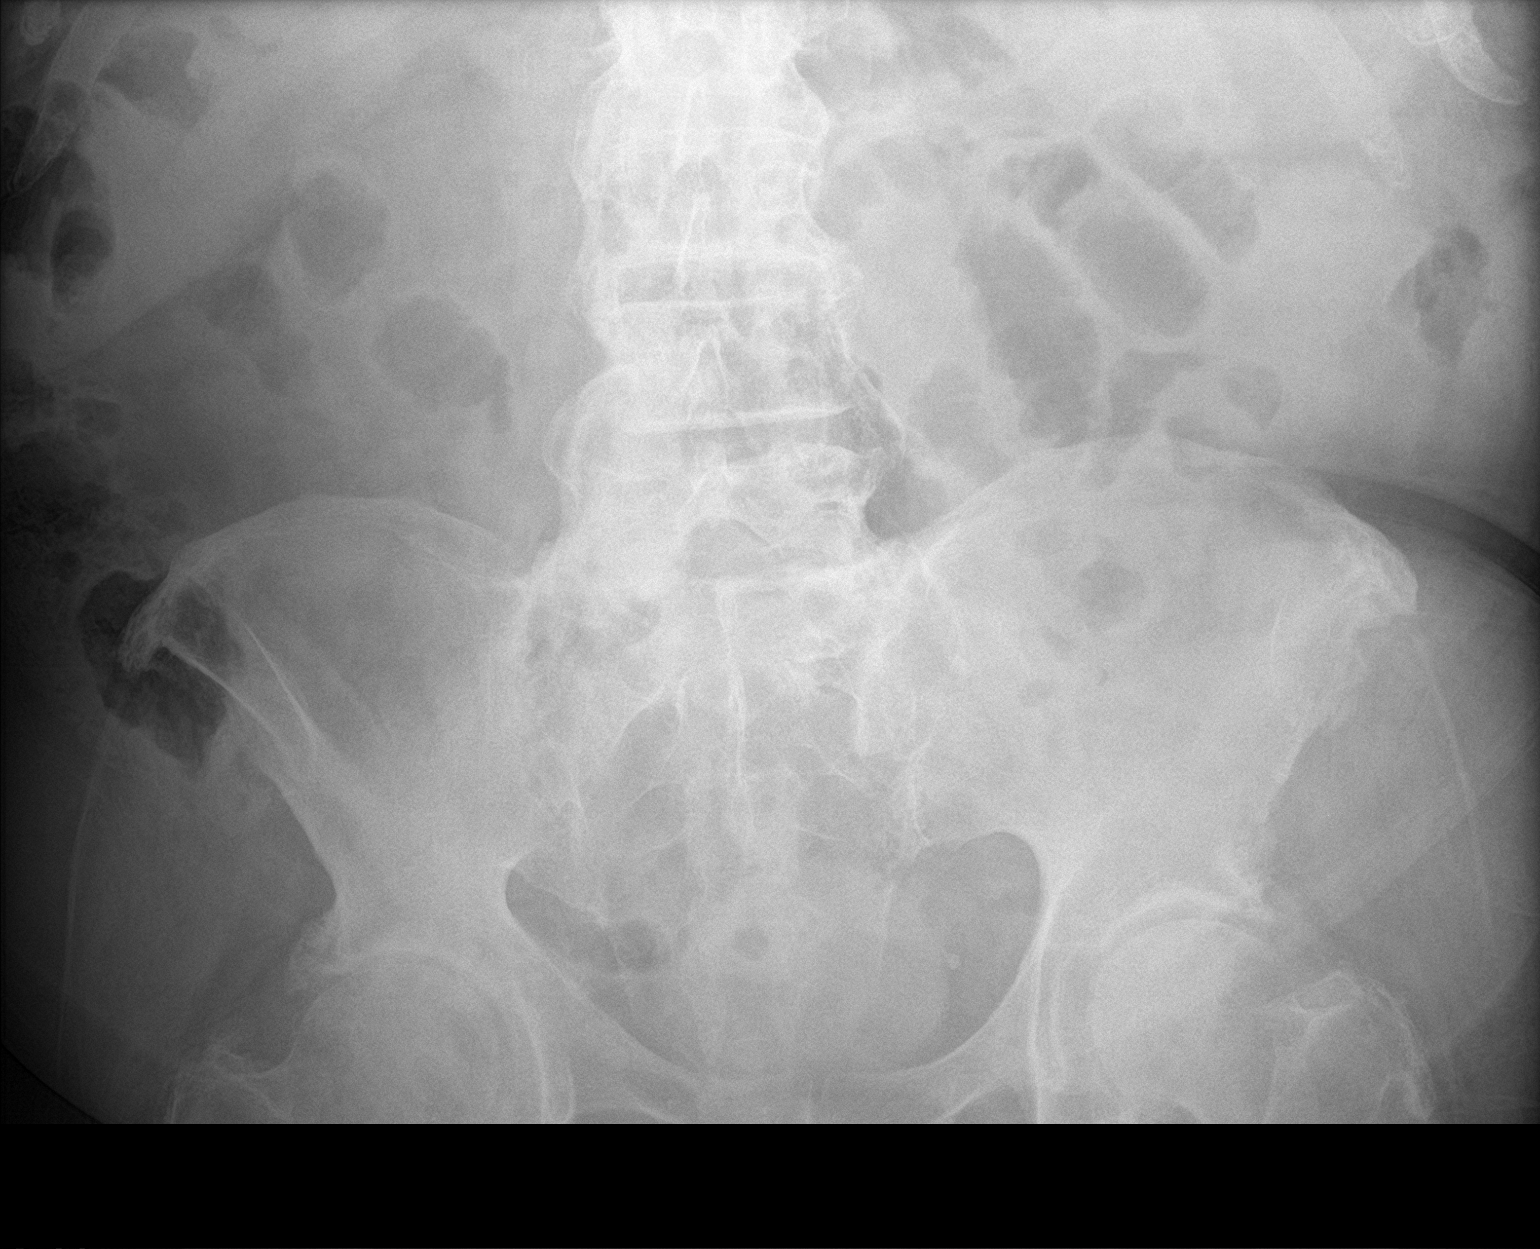

[3 of 3 positions shown; findings below may reference images not displayed]

FINDINGS: The bowel gas pattern is normal. No radio-opaque calculi or other
significant radiographic abnormality are seen.
IMPRESSION: Negative.

## 2017-10-16 NOTE — Telephone Encounter (Signed)
Patient can come by and pick up or we can mail----he would have to sign medical records release for [place] to fax somewhere

## 2017-10-16 NOTE — Telephone Encounter (Signed)
Copied from CRM [id number]. Topic: General - Other >> Oct 16, 2017  3:04 PM Maia Petties wrote: Reason for CRM: requesting copy of most recent flu vaccine. Please fax to [phone].

## 2017-10-16 NOTE — Telephone Encounter (Signed)
Is this okay to fax for the patient? Please advise.

## 2017-10-17 ENCOUNTER — Other Ambulatory Visit: Payer: Self-pay | Admitting: Internal Medicine

## 2017-10-17 DIAGNOSIS — M159 Polyosteoarthritis, unspecified: Secondary | ICD-10-CM

## 2017-10-17 DIAGNOSIS — Z79899 Other long term (current) drug therapy: Secondary | ICD-10-CM | POA: Diagnosis not present

## 2017-10-17 DIAGNOSIS — M8949 Other hypertrophic osteoarthropathy, multiple sites: Secondary | ICD-10-CM

## 2017-10-17 DIAGNOSIS — M150 Primary generalized (osteo)arthritis: Principal | ICD-10-CM

## 2017-10-17 LAB — BASIC METABOLIC PANEL
BUN/Creatinine Ratio: 16 (calc) (ref 6–22)
BUN: 21 mg/dL (ref 7–25)
CO2: 38 mmol/L — ABNORMAL HIGH (ref 20–32)
Calcium: 9.3 mg/dL (ref 8.6–10.3)
Chloride: 99 mmol/L (ref 98–110)
Creat: 1.29 mg/dL — ABNORMAL HIGH (ref 0.70–1.25)
Glucose, Bld: 149 mg/dL — ABNORMAL HIGH (ref 65–139)
Potassium: 3.6 mmol/L (ref 3.5–5.3)
Sodium: 142 mmol/L (ref 135–146)

## 2017-10-17 IMAGING — CR DG CHEST 1V PORT
1 series · 1 of 1 positions shown · non-contrast
Comparison: January 19, 2015

CLINICAL DATA: Shortness of breath.

EXAM:
PORTABLE CHEST 1 VIEW

[portable]
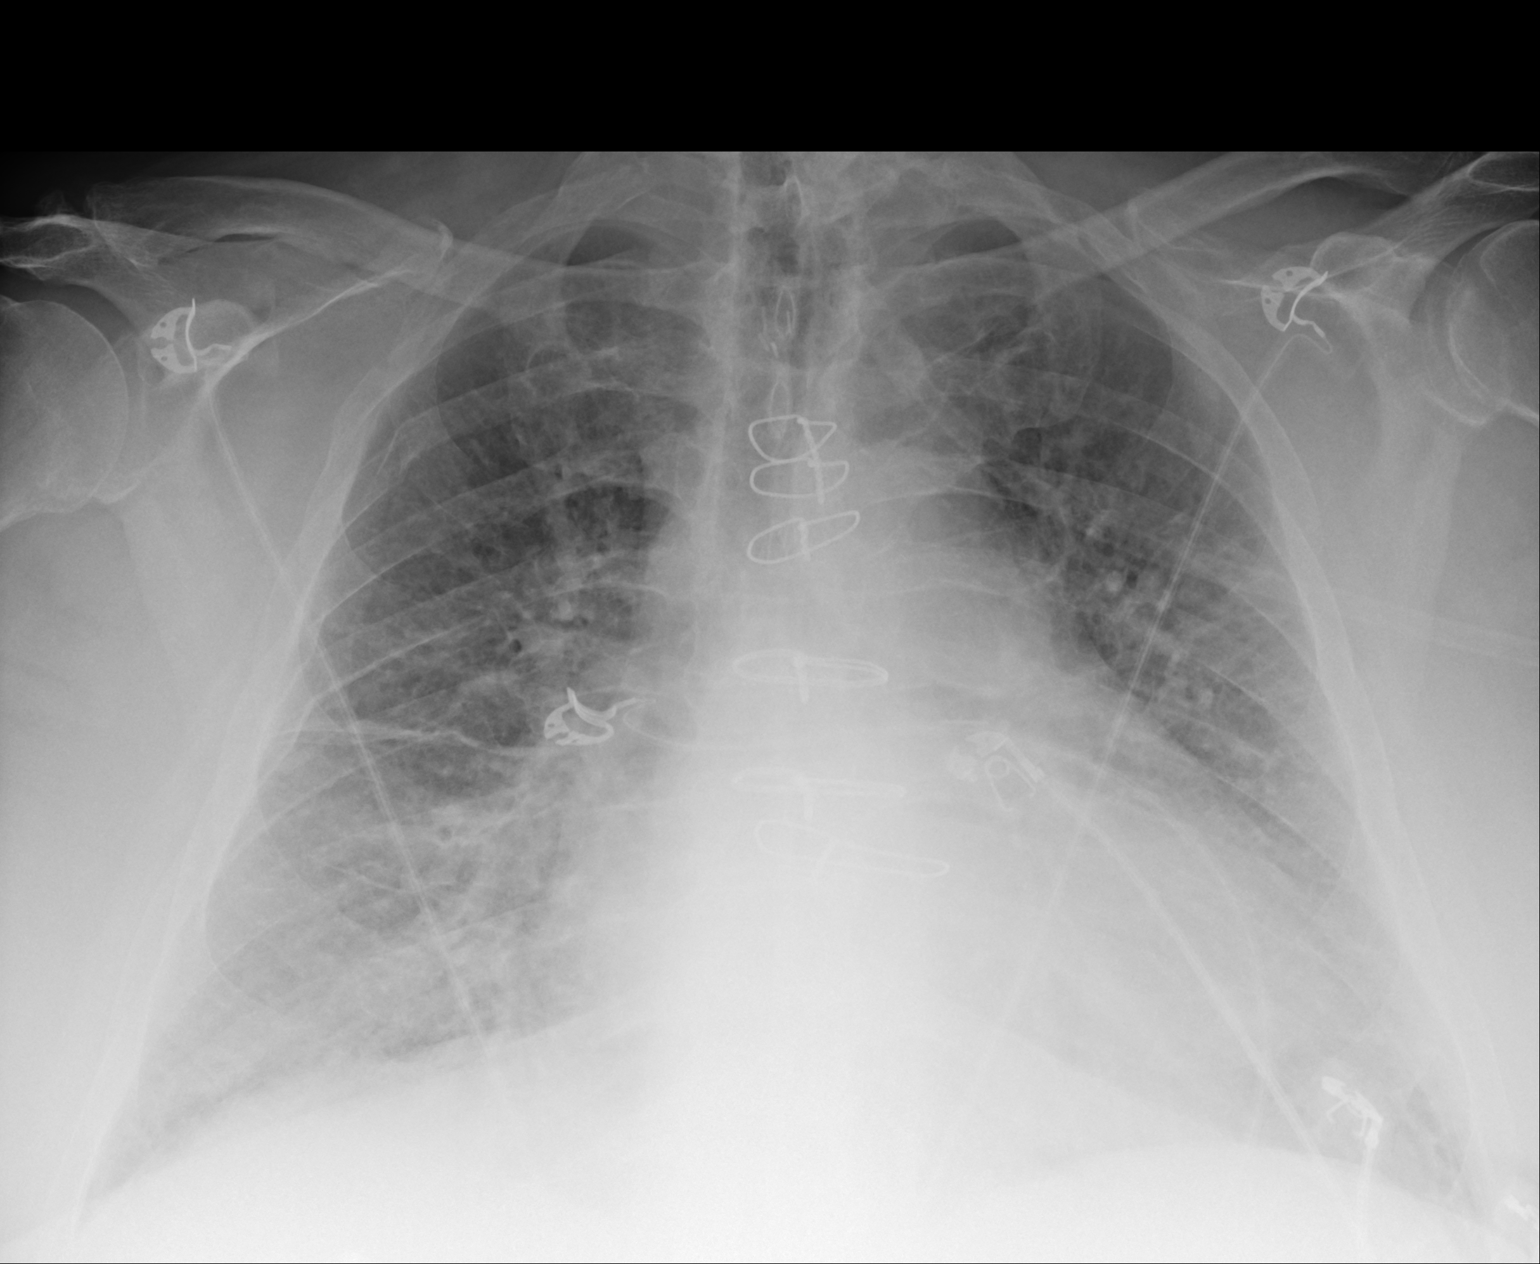

[1 of 1 positions shown; findings below may reference images not displayed]

FINDINGS: Cardiomegaly and moderate edema. No other acute abnormalities or
changes. Healed right rib fracture.
IMPRESSION: Cardiomegaly and moderate edema. No other acute abnormalities or
changes.

## 2017-10-17 NOTE — Telephone Encounter (Signed)
V 07/31/17 Dr. Yetta Barre [facility]

## 2017-10-17 NOTE — Telephone Encounter (Signed)
MD out of office will hold until he return Monday for approval status Per office refill policy 24-48 hours.Marland KitchenRaechel Chute

## 2017-10-17 NOTE — Telephone Encounter (Signed)
A medical records request has been faxed to [facility] at [facility]. She said that she will have the patient sign the form and fax back to [place] for this information.

## 2017-10-17 NOTE — Telephone Encounter (Signed)
Copied from CRM [id number]. Topic: Quick Communication - Rx Refill/Question >> Oct 17, 2017 11:24 AM Percival Spanish wrote: Medication  oxyCODONE (OXY IR/ROXICODONE) 5 MG immediate release tablet  Has the patient contacted their pharmacy yes   Preferred Pharmacy  [facility]  Agent: Please be advised that RX refills may take up to 3 business days. We ask that you follow-up with your pharmacy.

## 2017-10-18 MED ORDER — OXYCODONE HCL 5 MG PO TABS: 5.0000 mg | ORAL_TABLET | Freq: Two times a day (BID) | ORAL | 0 refills | Status: DC | PRN

## 2017-10-20 NOTE — Telephone Encounter (Signed)
Faxed Flu vaccine information to [employer].

## 2017-10-21 NOTE — Telephone Encounter (Signed)
Confirmed it was received

## 2017-10-22 ENCOUNTER — Other Ambulatory Visit: Payer: Self-pay | Admitting: Internal Medicine

## 2017-10-22 ENCOUNTER — Other Ambulatory Visit: Payer: Self-pay | Admitting: Cardiovascular Disease

## 2017-10-22 DIAGNOSIS — F32A Depression, unspecified: Secondary | ICD-10-CM

## 2017-10-22 DIAGNOSIS — F329 Major depressive disorder, single episode, unspecified: Secondary | ICD-10-CM

## 2017-10-22 DIAGNOSIS — F450 Somatization disorder: Secondary | ICD-10-CM

## 2017-10-22 DIAGNOSIS — Z794 Long term (current) use of insulin: Secondary | ICD-10-CM

## 2017-10-22 DIAGNOSIS — J301 Allergic rhinitis due to pollen: Secondary | ICD-10-CM

## 2017-10-22 DIAGNOSIS — E118 Type 2 diabetes mellitus with unspecified complications: Secondary | ICD-10-CM

## 2017-10-22 DIAGNOSIS — M109 Gout, unspecified: Secondary | ICD-10-CM

## 2017-10-23 ENCOUNTER — Ambulatory Visit: Payer: Self-pay

## 2017-10-23 ENCOUNTER — Telehealth: Payer: Self-pay | Admitting: Internal Medicine

## 2017-10-23 NOTE — Telephone Encounter (Signed)
Copied from CRM [id number]. Topic: Quick Communication - See Telephone Encounter >> Oct 23, 2017  9:04 AM Landry Mellow wrote: CRM for notification. See Telephone encounter for: 10/23/17. Stacey from [facility] called  Pt has cough, nurse is requesting something for cough.  [facility]  in [place].  Cb is [phone]

## 2017-10-23 NOTE — Telephone Encounter (Signed)
[name] at [facility] reports pt. Has had a productive cough x 1 week. Has low grade fever and yellow mucus. Notices some "rattling in chest - no wheezing." "Some shortness of breath." [name] reports they can not bring pt. In for an office visit today because they 5 day notice for transportation [place]. Request medication be called in to [facility] in [place]. Please advise. Answer Assessment - Initial Assessment Questions 1. ONSET: "When did the cough begin?"      Started 1 week ago 2. SEVERITY: "How bad is the cough today?"      Moderate 3. RESPIRATORY DISTRESS: "Describe your breathing."      No distress 4. FEVER: "Do you have a fever?" If so, ask: "What is your temperature, how was it measured, and when did it start?"     Low grade fever 5. SPUTUM: "Describe the color of your sputum" (clear, white, yellow, green)     Yellow 6. HEMOPTYSIS: "Are you coughing up any blood?" If so ask: "How much?" (flecks, streaks, tablespoons, etc.)     No 7. CARDIAC HISTORY: "Do you have any history of heart disease?" (e.g., heart attack, congestive heart failure)      Yes 8. LUNG HISTORY: "Do you have any history of lung disease?"  (e.g., pulmonary embolus, asthma, emphysema)     COPD 9. PE RISK FACTORS: "Do you have a history of blood clots?" (or: recent major surgery, recent prolonged travel, bedridden )     No 10. OTHER SYMPTOMS: "Do you have any other symptoms?" (e.g., runny nose, wheezing, chest pain)       Some "rattling" 11. PREGNANCY: "Is there any chance you are pregnant?" "When was your last menstrual period?"       No 12. TRAVEL: "Have you traveled out of the country in the last month?" (e.g., travel history, exposures)       No  Protocols used: COUGH - ACUTE PRODUCTIVE-A-AH

## 2017-10-23 NOTE — Telephone Encounter (Signed)
Contacted [facility] and informed that pt would need to be seen.   An appt has been made.

## 2017-10-29 ENCOUNTER — Telehealth: Payer: Self-pay | Admitting: Internal Medicine

## 2017-10-29 NOTE — Telephone Encounter (Signed)
Copied from CRM [id number]. Topic: Inquiry >> Oct 29, 2017  2:52 PM Windy Kalata, [place] wrote: Reason for CRM: Dondra Spry is calling from [facility] and states that [facility] was told by the office that we do not bill Medicaid. Dondra Spry is stating that [facility] bills medicaid in order to transport the patient. Dondra Spry states that the office needs to call [facility] and let them know it is okay to pick the patient up, then Dondra Spry would like to be called back so that she knows if the patient appt needs to be canceled or if the transportation is going to be able to pick him up. Please advise.  [facility] # [phone] or [phone] fax # [phone] Fax # [phone]  >> Oct 29, 2017  3:11 PM Rudi Coco, [place] wrote: Dondra Spry is calling back  from [facility] and states that [facility] will need a fax stating pt. Appt. Date and Time for tomorrow fax number that was originally sent isn't working new fax number [phone]

## 2017-10-29 NOTE — Telephone Encounter (Signed)
Telephone encounter created by mistake

## 2017-10-30 ENCOUNTER — Encounter: Payer: Self-pay | Admitting: Internal Medicine

## 2017-10-30 ENCOUNTER — Telehealth: Payer: Self-pay | Admitting: Internal Medicine

## 2017-10-30 ENCOUNTER — Ambulatory Visit (INDEPENDENT_AMBULATORY_CARE_PROVIDER_SITE_OTHER): Payer: Medicare Other | Admitting: Internal Medicine

## 2017-10-30 VITALS — BP 134/82 | HR 78 | Temp 98.7°F | Ht 69.0 in | Wt 270.0 lb

## 2017-10-30 DIAGNOSIS — Z794 Long term (current) use of insulin: Secondary | ICD-10-CM | POA: Diagnosis not present

## 2017-10-30 DIAGNOSIS — J019 Acute sinusitis, unspecified: Secondary | ICD-10-CM | POA: Diagnosis not present

## 2017-10-30 DIAGNOSIS — E118 Type 2 diabetes mellitus with unspecified complications: Secondary | ICD-10-CM | POA: Diagnosis not present

## 2017-10-30 DIAGNOSIS — R062 Wheezing: Secondary | ICD-10-CM | POA: Diagnosis not present

## 2017-10-30 MED ORDER — AZITHROMYCIN 250 MG PO TABS: ORAL_TABLET | ORAL | 1 refills | Status: DC

## 2017-10-30 MED ORDER — PREDNISONE 10 MG PO TABS: ORAL_TABLET | ORAL | 0 refills | Status: DC

## 2017-10-30 MED ORDER — PROMETHAZINE-CODEINE 6.25-10 MG/5ML PO SYRP: 5.0000 mL | ORAL_SOLUTION | Freq: Four times a day (QID) | ORAL | 0 refills | Status: DC | PRN

## 2017-10-30 NOTE — Telephone Encounter (Signed)
Copied from CRM [id number]. Topic: Quick Communication - See Telephone Encounter >> Oct 30, 2017  4:19 PM Arlyss Gandy, NT wrote: CRM for notification. See Telephone encounter for: 10/30/17. Victorino Dike with [facility] calling to get the rx for promethazine-codeine (PHENERGAN WITH CODEINE) 6.25-10 MG/5ML syrup changed. Pts insurance will not cover and pt cannot afford the medication. Would like it changed to a cheaer alternative. CB#: [phone]

## 2017-10-30 NOTE — Assessment & Plan Note (Signed)
Mild to mod, for antibx course,  to f/u any worsening symptoms or concerns 

## 2017-10-30 NOTE — Assessment & Plan Note (Addendum)
Mild with cough and little to no sob; for predpac asd, cough med prn,  to f/u any worsening symptoms or concerns, declines cxr

## 2017-10-30 NOTE — Patient Instructions (Signed)
Please take all new medication as prescribed - the antibiotic, cough medicine if needed, and prednisone  Please continue all other medications as before, and refills have been done if requested.  Please have the pharmacy call with any other refills you may need.  Please keep your appointments with your specialists as you may have planned   
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
## 2017-10-30 NOTE — Assessment & Plan Note (Signed)
Lab Results  Component Value Date   HGBA1C 7.2 (H) 07/14/2017  stable overall by history and exam, recent data reviewed with pt, and pt to continue medical treatment as before,  to f/u any worsening symptoms or concerns

## 2017-11-01 IMAGING — CT CT ABD-PELV W/ CM
2 of 5 series · 16 of 46 positions shown, 18 images · IV contrast (APPLIED)
Comparison: 05/25/2011

CLINICAL DATA: Nausea and vomiting, onset last night after eating.
Epigastric abdominal pain.

EXAM:
CT ABDOMEN AND PELVIS WITH CONTRAST
TECHNIQUE: Multidetector CT imaging of the abdomen and pelvis was performed
using the standard protocol following bolus administration of
intravenous contrast.
CONTRAST:  100mL 3D9M8L-FDD IOPAMIDOL (3D9M8L-FDD) INJECTION 61%, 1
3D9M8L-FDD IOPAMIDOL (3D9M8L-FDD) INJECTION 61%

[Series 2: axial st · axial · 0.98mm/px · z∈[-898,-468]mm · 13 of 98 slices shown, 15 images]
[im 6/98  soft-tissue]
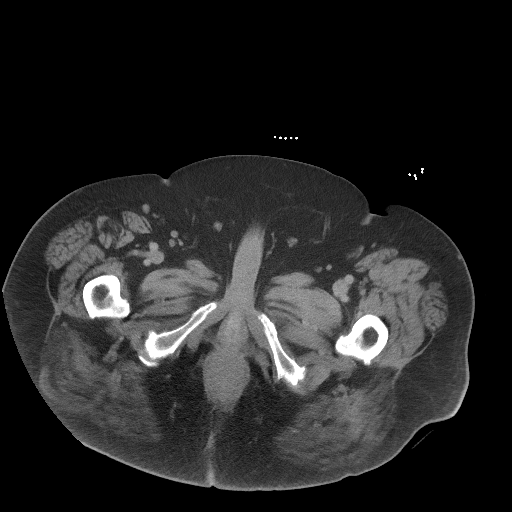
[im 6/98  bone]
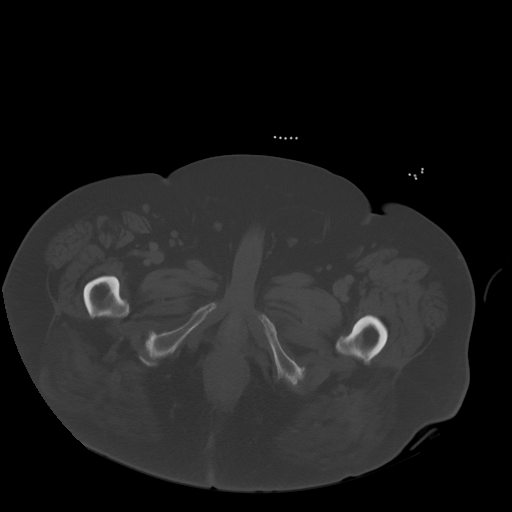
[im 11/98  soft-tissue]
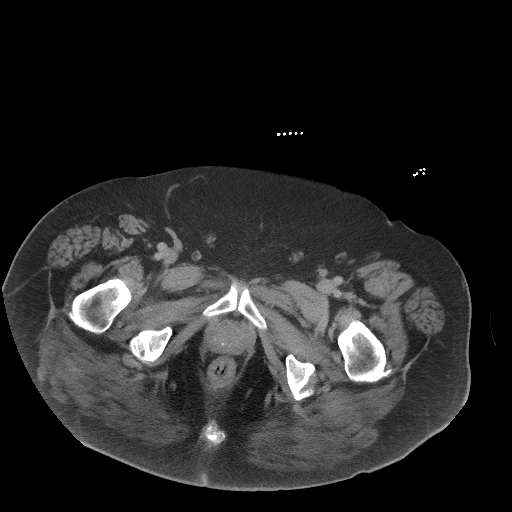
[im 22/98  soft-tissue]
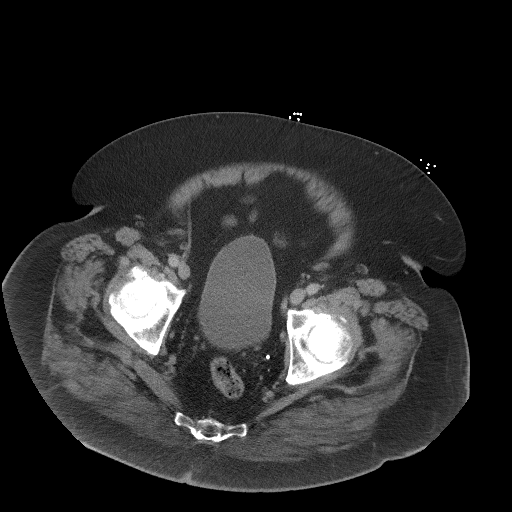
[im 27/98  soft-tissue]
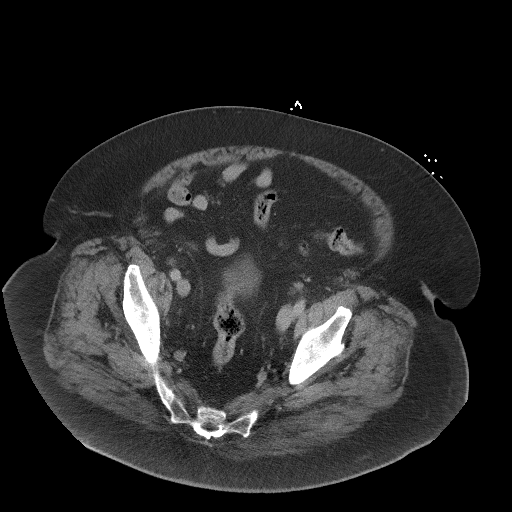
[im 33/98  soft-tissue]
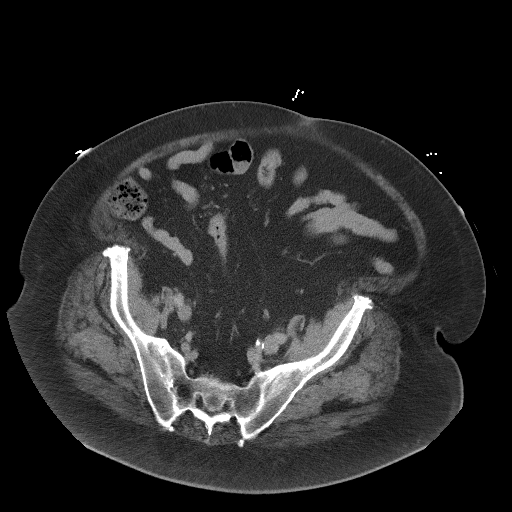
[im 44/98  soft-tissue]
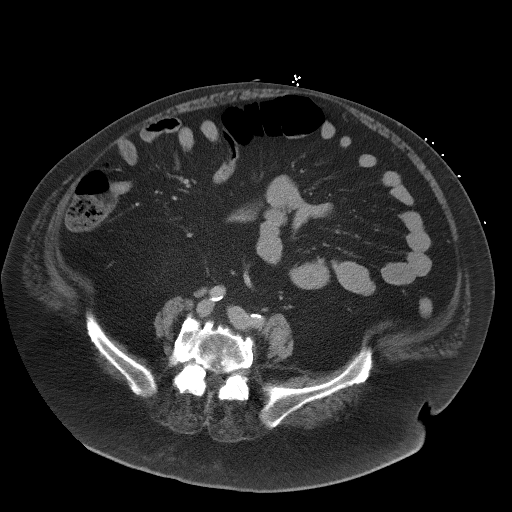
[im 49/98  soft-tissue]
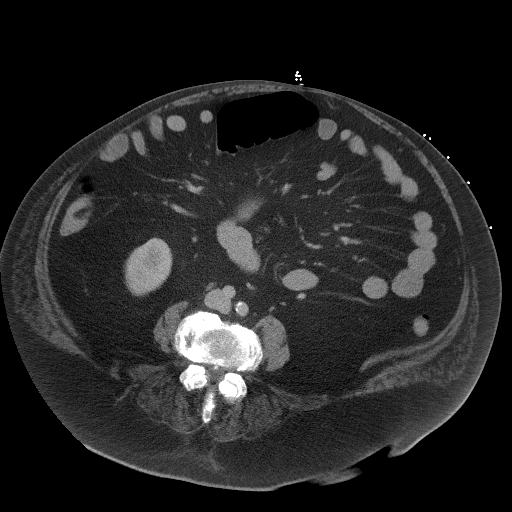
[im 54/98  soft-tissue]
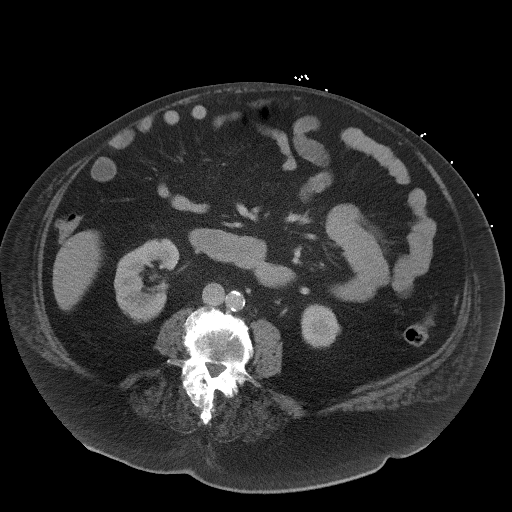
[im 65/98  soft-tissue]
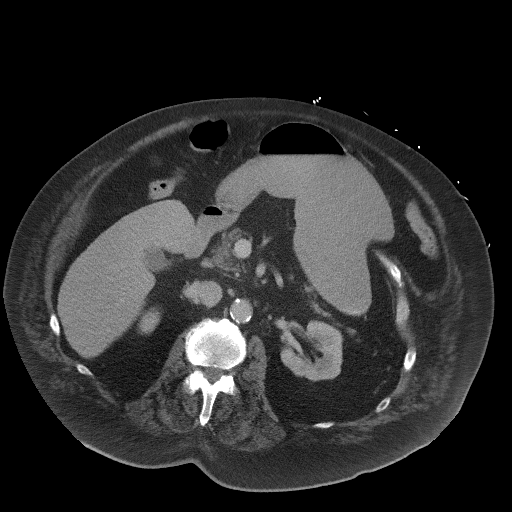
[im 65/98  bone]
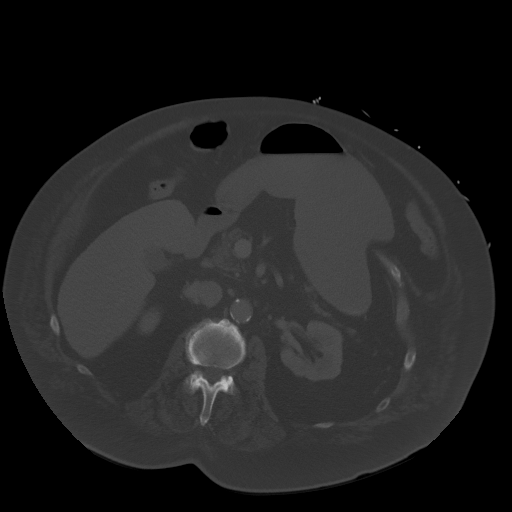
[im 71/98  soft-tissue]
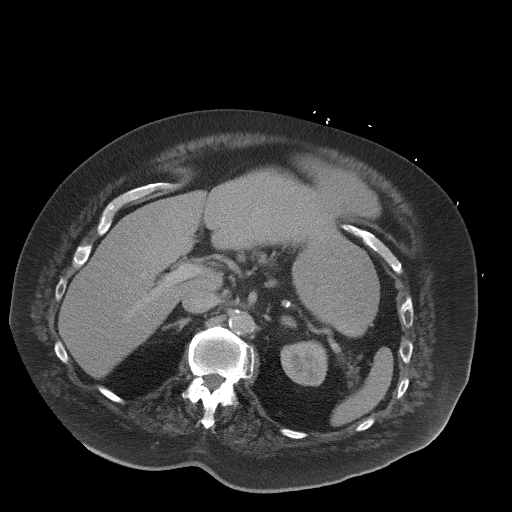
[im 76/98  soft-tissue]
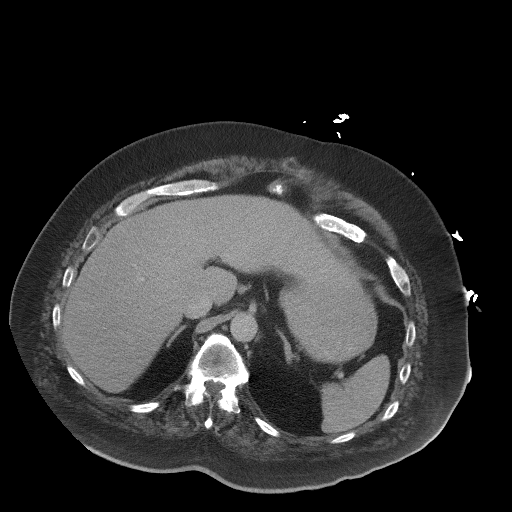
[im 87/98  soft-tissue]
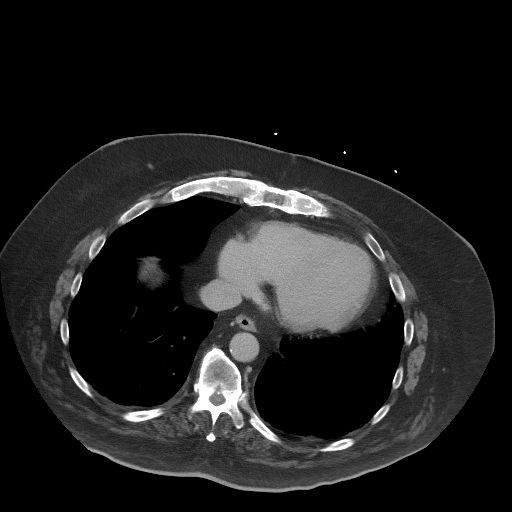
[im 92/98  soft-tissue]
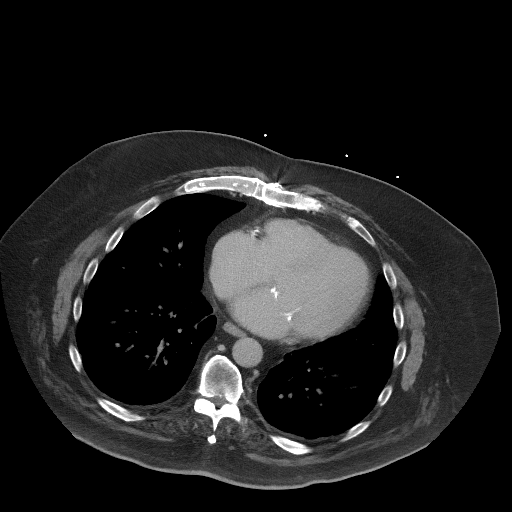

[Series 4: coronal st · coronal · 0.99mm/px · 3 of 134 slices shown]
[im 45/134  soft-tissue]
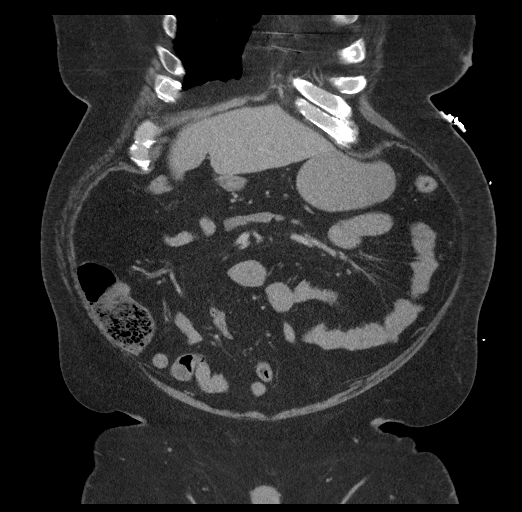
[im 60/134  soft-tissue]
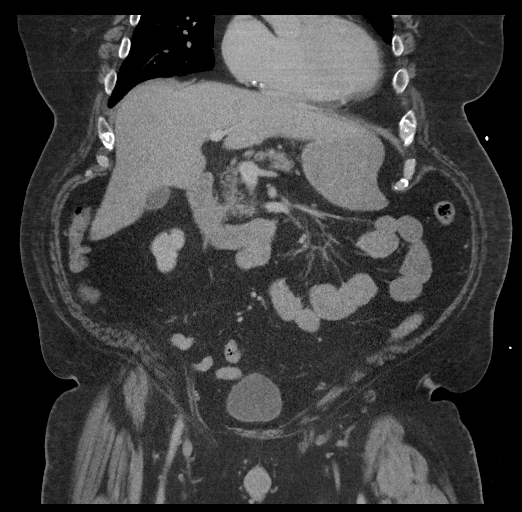
[im 74/134  soft-tissue]
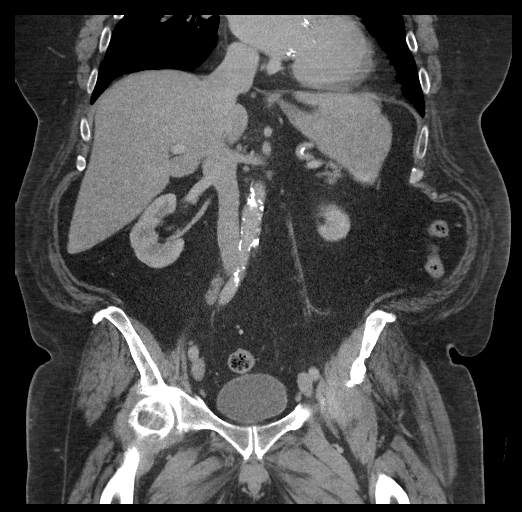

[16 of 46 positions shown; findings below may reference images not displayed]

FINDINGS: Lower chest: Interlobular septal thickening may represent
interstitial edema. No pleural or pericardial effusions.

Hepatobiliary: No focal liver abnormality is seen. No gallstones,
gallbladder wall thickening, or biliary dilatation.

Pancreas: Unremarkable. No pancreatic ductal dilatation or
surrounding inflammatory changes.

Spleen: Normal in size without focal abnormality.

Adrenals/Urinary Tract: Adrenals are unremarkable. Lower pole left
collecting system calculi measure up to 3 mm. Hypodense renal
lesions measure up to 2 point 3 cm, likely cysts although not
conclusively characterized. Ureters are unremarkable. Urinary
bladder is unremarkable.

Stomach/Bowel: Stomach is within normal limits. Appendix appears
normal. No evidence of bowel wall thickening, distention, or
inflammatory changes.

Vascular/Lymphatic: Aortic atherosclerosis. No enlarged abdominal or
pelvic lymph nodes.

Reproductive: Unremarkable

Other: No acute inflammatory changes are evident in the abdomen or
pelvis. There is no ascites.

Musculoskeletal: No significant skeletal lesions. No acute bony
abnormalities. Moderately severe right hip arthritis. Mild to
moderate left hip arthritis. Extensive lumbar degenerative disc and
facet disease.
IMPRESSION: 1. No acute findings are evident in the abdomen or pelvis.
2. Lung bases exhibit interlobular septal thickening, suggesting
interstitial edema.
3. Lower pole left nephrolithiasis, nonobstructing.

## 2017-11-01 IMAGING — DX DG CHEST 2V
2 series · 2 of 2 positions shown · non-contrast
Comparison: 05/05/2016

CLINICAL DATA: Nausea and vomiting, onset last night after eating.
Epigastric abdominal pain.

EXAM:
CHEST  2 VIEW

[chest pa]
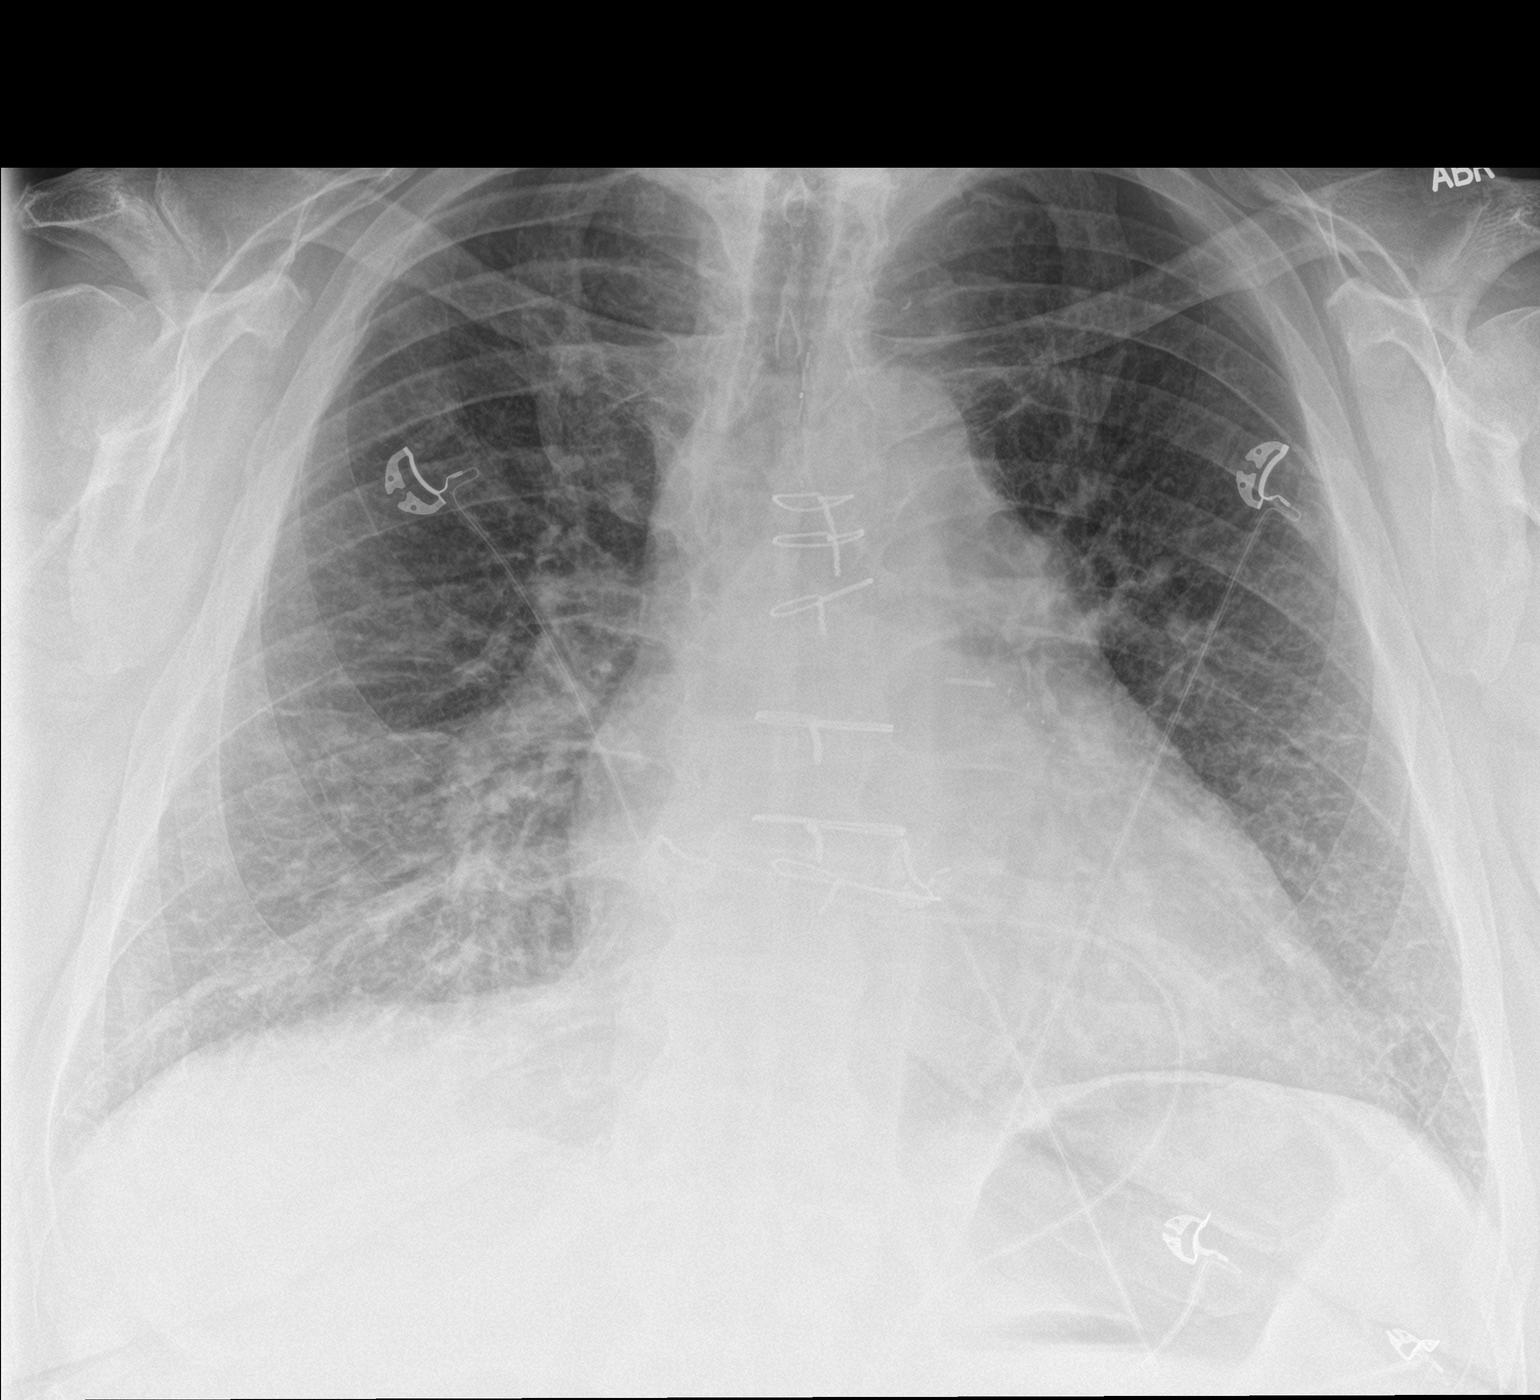

[chest lat]
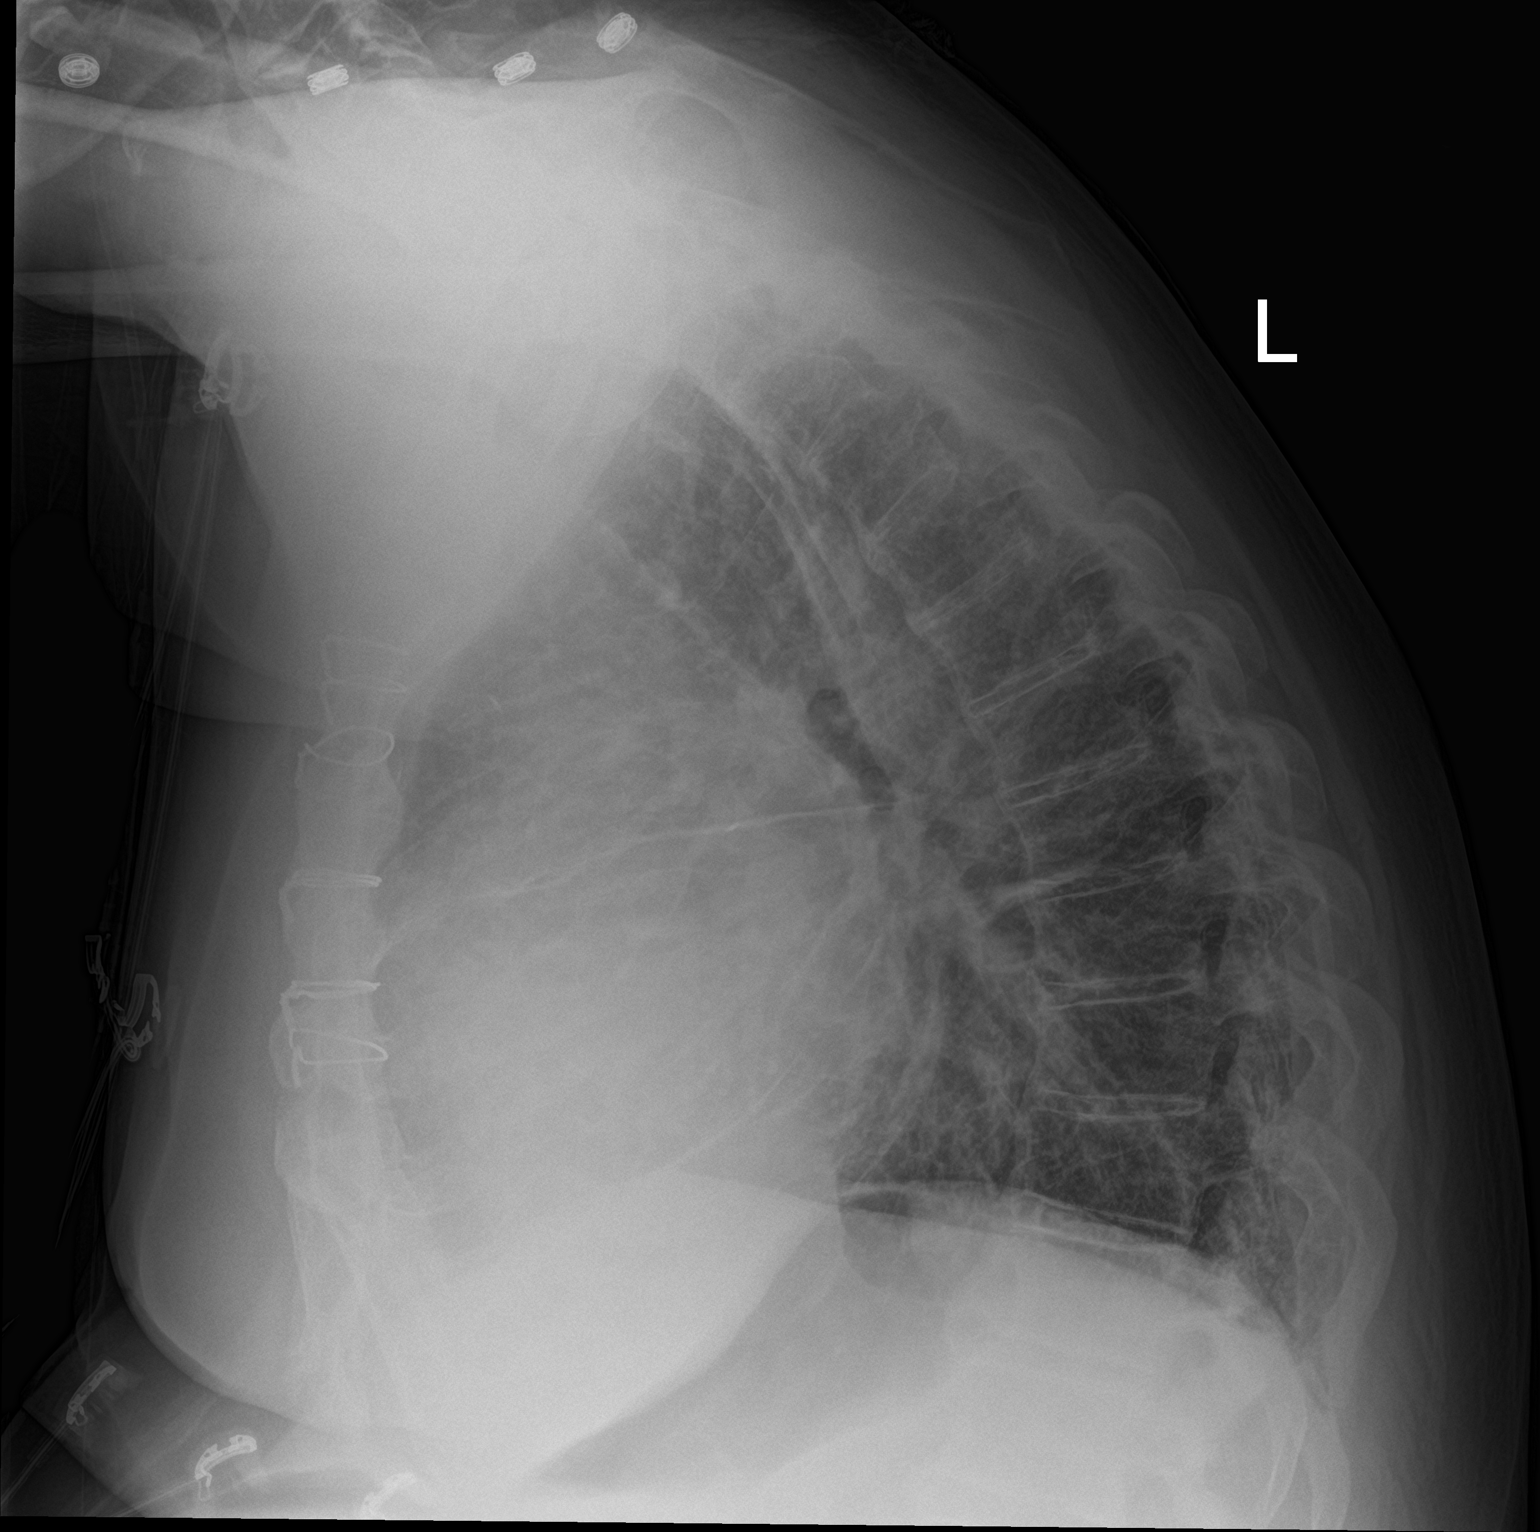

[2 of 2 positions shown; findings below may reference images not displayed]

FINDINGS: There is unchanged moderate cardiomegaly. Mild hyperinflation. There
is extensive interstitial thickening which probably represents
interstitial edema. No airspace consolidation. No effusion. Hilar
and mediastinal contours are unremarkable and unchanged. There is
prior median sternotomy.
IMPRESSION: Cardiomegaly and extensive interstitial thickening. This may
represent interstitial edema. No consolidation. No effusion.

## 2017-11-03 ENCOUNTER — Other Ambulatory Visit: Payer: Self-pay | Admitting: Internal Medicine

## 2017-11-03 ENCOUNTER — Telehealth: Payer: Self-pay | Admitting: Internal Medicine

## 2017-11-03 ENCOUNTER — Telehealth: Payer: Self-pay

## 2017-11-03 DIAGNOSIS — M109 Gout, unspecified: Secondary | ICD-10-CM

## 2017-11-03 MED ORDER — BENZONATATE 100 MG PO CAPS: ORAL_CAPSULE | ORAL | 0 refills | Status: DC

## 2017-11-03 MED ORDER — COLCHICINE 0.6 MG PO TABS: 0.6000 mg | ORAL_TABLET | Freq: Two times a day (BID) | ORAL | 3 refills | Status: DC

## 2017-11-03 NOTE — Telephone Encounter (Signed)
Copied from CRM [id number]. Topic: Quick Communication - Rx Refill/Question >> Nov 03, 2017  4:44 PM Raquel Sarna wrote: Benzonatate 100 mg is not being covered under pt's insurance  Pharmaracy is asking if the Rx can be switched to Robitussin    [facility], [place] - [address]  [place] [address] Phone: [phone] Fax: [phone]

## 2017-11-03 NOTE — Telephone Encounter (Signed)
Yes, keep taking it RX sent

## 2017-11-03 NOTE — Telephone Encounter (Signed)
Ok for tessalon perle - done  erx 

## 2017-11-03 NOTE — Telephone Encounter (Signed)
Pt is currently taking colchicine 0.6 mg bid. [facility] is wanting to confirm if pt should continue taking this medication. If so, they will need an rx sent to [facility]. Please advise.

## 2017-11-03 NOTE — Telephone Encounter (Signed)
Is there an alternative to pt rx for promethazine codeine cough syrup. Pt is not able to afford and insurance will not cover.

## 2017-11-04 MED ORDER — GUAIFENESIN-CODEINE 100-10 MG/5ML PO SYRP: 5.0000 mL | ORAL_SOLUTION | Freq: Three times a day (TID) | ORAL | 0 refills | Status: DC | PRN

## 2017-11-04 NOTE — Telephone Encounter (Signed)
Done erx 

## 2017-11-04 NOTE — Telephone Encounter (Signed)
[facility] called back and said that medication guaiFENesin-codeine (ROBITUSSIN AC) 100-10 MG/5ML syrup  is too much for patient and would like to know if Dr. Jonny Ruiz can just prescribe plain Robitussin for patient. Please call them back to let them know.

## 2017-11-04 NOTE — Addendum Note (Signed)
Addended by: Radford Pax M on: 11/04/2017 03:16 PM   Modules accepted: Orders

## 2017-11-04 NOTE — Telephone Encounter (Signed)
Contacted pharmacy and gave verbal okay to change to the OTC robitussin per Dr. Jonny Ruiz.

## 2017-11-06 ENCOUNTER — Other Ambulatory Visit: Payer: Self-pay | Admitting: Internal Medicine

## 2017-11-14 ENCOUNTER — Other Ambulatory Visit: Payer: Self-pay | Admitting: Internal Medicine

## 2017-11-14 DIAGNOSIS — F32A Depression, unspecified: Secondary | ICD-10-CM

## 2017-11-14 DIAGNOSIS — E118 Type 2 diabetes mellitus with unspecified complications: Secondary | ICD-10-CM

## 2017-11-14 DIAGNOSIS — F329 Major depressive disorder, single episode, unspecified: Secondary | ICD-10-CM

## 2017-11-14 DIAGNOSIS — Z794 Long term (current) use of insulin: Principal | ICD-10-CM

## 2017-11-14 DIAGNOSIS — F450 Somatization disorder: Secondary | ICD-10-CM

## 2017-11-17 ENCOUNTER — Other Ambulatory Visit: Payer: Self-pay | Admitting: Internal Medicine

## 2017-11-21 ENCOUNTER — Ambulatory Visit (INDEPENDENT_AMBULATORY_CARE_PROVIDER_SITE_OTHER): Payer: Medicare Other | Admitting: Cardiovascular Disease

## 2017-11-21 ENCOUNTER — Encounter: Payer: Self-pay | Admitting: Cardiovascular Disease

## 2017-11-21 VITALS — BP 120/70 | HR 73 | Ht 69.0 in | Wt 274.0 lb

## 2017-11-21 DIAGNOSIS — E7800 Pure hypercholesterolemia, unspecified: Secondary | ICD-10-CM

## 2017-11-21 DIAGNOSIS — Z79899 Other long term (current) drug therapy: Secondary | ICD-10-CM

## 2017-11-21 DIAGNOSIS — I10 Essential (primary) hypertension: Secondary | ICD-10-CM

## 2017-11-21 DIAGNOSIS — I429 Cardiomyopathy, unspecified: Secondary | ICD-10-CM

## 2017-11-21 DIAGNOSIS — Z952 Presence of prosthetic heart valve: Secondary | ICD-10-CM

## 2017-11-21 DIAGNOSIS — I5022 Chronic systolic (congestive) heart failure: Secondary | ICD-10-CM | POA: Diagnosis not present

## 2017-11-21 DIAGNOSIS — I482 Chronic atrial fibrillation, unspecified: Secondary | ICD-10-CM

## 2017-11-21 DIAGNOSIS — R600 Localized edema: Secondary | ICD-10-CM | POA: Diagnosis not present

## 2017-11-21 MED ORDER — METOLAZONE 2.5 MG PO TABS: 2.5000 mg | ORAL_TABLET | ORAL | 3 refills | Status: DC

## 2017-11-21 MED ORDER — POTASSIUM CHLORIDE CRYS ER 20 MEQ PO TBCR: 20.0000 meq | EXTENDED_RELEASE_TABLET | Freq: Every day | ORAL | 9 refills | Status: DC
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
## 2017-11-21 NOTE — Patient Instructions (Addendum)
Medication Instructions:   Your physician has recommended you make the following change in your medication:   Start metolazone 2.5 mg by mouth on Monday & Friday. Please take an extra potassium 20 meq with this dose on Monday and Friday.  Continue all other medications the same.  Labwork:  Your physician recommends that you return for lab work in 2 weeks for a BMET.  Testing/Procedures: Your physician has requested that you have an echocardiogram. Echocardiography is a painless test that uses sound waves to create images of your heart. It provides your doctor with information about the size and shape of your heart and how well your heart's chambers and valves are working. This procedure takes approximately one hour. There are no restrictions for this procedure.  Follow-Up:  Your physician recommends that you schedule a follow-up appointment in: 3 months.  Any Other Special Instructions Will Be Listed Below (If Applicable).  If you need a refill on your cardiac medications before your next appointment, please call your pharmacy.

## 2017-11-26 LAB — HM DIABETES EYE EXAM

## 2017-11-27 DIAGNOSIS — Z794 Long term (current) use of insulin: Secondary | ICD-10-CM | POA: Diagnosis not present

## 2017-11-27 DIAGNOSIS — E119 Type 2 diabetes mellitus without complications: Secondary | ICD-10-CM | POA: Diagnosis not present

## 2017-11-27 DIAGNOSIS — H25813 Combined forms of age-related cataract, bilateral: Secondary | ICD-10-CM | POA: Diagnosis not present

## 2017-11-27 DIAGNOSIS — H524 Presbyopia: Secondary | ICD-10-CM | POA: Diagnosis not present

## 2017-12-02 ENCOUNTER — Ambulatory Visit (HOSPITAL_COMMUNITY)
Admission: RE | Admit: 2017-12-02 | Discharge: 2017-12-02 | Disposition: A | Discharge status: Home / Self Care | Payer: Medicare Other | Source: Ambulatory Visit | Attending: Cardiovascular Disease | Admitting: Cardiovascular Disease

## 2017-12-02 ENCOUNTER — Encounter: Payer: Self-pay | Admitting: Cardiology

## 2017-12-02 DIAGNOSIS — Z952 Presence of prosthetic heart valve: Secondary | ICD-10-CM | POA: Diagnosis not present

## 2017-12-02 DIAGNOSIS — Z79899 Other long term (current) drug therapy: Secondary | ICD-10-CM | POA: Diagnosis not present

## 2017-12-02 DIAGNOSIS — I10 Essential (primary) hypertension: Secondary | ICD-10-CM | POA: Diagnosis not present

## 2017-12-02 DIAGNOSIS — I110 Hypertensive heart disease with heart failure: Secondary | ICD-10-CM | POA: Diagnosis not present

## 2017-12-02 DIAGNOSIS — E785 Hyperlipidemia, unspecified: Secondary | ICD-10-CM | POA: Diagnosis not present

## 2017-12-02 DIAGNOSIS — I429 Cardiomyopathy, unspecified: Secondary | ICD-10-CM | POA: Diagnosis not present

## 2017-12-02 DIAGNOSIS — J449 Chronic obstructive pulmonary disease, unspecified: Secondary | ICD-10-CM | POA: Diagnosis not present

## 2017-12-02 DIAGNOSIS — E7800 Pure hypercholesterolemia, unspecified: Secondary | ICD-10-CM

## 2017-12-02 DIAGNOSIS — I482 Chronic atrial fibrillation, unspecified: Secondary | ICD-10-CM

## 2017-12-02 DIAGNOSIS — R600 Localized edema: Secondary | ICD-10-CM

## 2017-12-02 DIAGNOSIS — I5022 Chronic systolic (congestive) heart failure: Secondary | ICD-10-CM

## 2017-12-02 DIAGNOSIS — Z87891 Personal history of nicotine dependence: Secondary | ICD-10-CM | POA: Diagnosis not present

## 2017-12-02 MED ORDER — PERFLUTREN LIPID MICROSPHERE
1.0000 mL | INTRAVENOUS | Status: AC | PRN
  Administered 2017-12-02: 2 mL via INTRAVENOUS

## 2017-12-02 NOTE — Progress Notes (Signed)
*  PRELIMINARY RESULTS* Echocardiogram 2D Echocardiogram with definity has been performed.  Jeryl Columbia 12/02/2017, 12:14 PM

## 2017-12-04 ENCOUNTER — Ambulatory Visit (INDEPENDENT_AMBULATORY_CARE_PROVIDER_SITE_OTHER): Payer: Medicare Other | Admitting: Internal Medicine

## 2017-12-04 ENCOUNTER — Telehealth: Payer: Self-pay

## 2017-12-04 ENCOUNTER — Other Ambulatory Visit: Payer: Self-pay | Admitting: Internal Medicine

## 2017-12-04 ENCOUNTER — Encounter: Payer: Self-pay | Admitting: Internal Medicine

## 2017-12-04 VITALS — BP 124/70 | HR 64 | Temp 97.8°F | Resp 16 | Ht 69.0 in | Wt 263.2 lb

## 2017-12-04 DIAGNOSIS — Z794 Long term (current) use of insulin: Secondary | ICD-10-CM | POA: Diagnosis not present

## 2017-12-04 DIAGNOSIS — K921 Melena: Secondary | ICD-10-CM

## 2017-12-04 DIAGNOSIS — E118 Type 2 diabetes mellitus with unspecified complications: Secondary | ICD-10-CM | POA: Diagnosis not present

## 2017-12-04 DIAGNOSIS — T402X5A Adverse effect of other opioids, initial encounter: Secondary | ICD-10-CM

## 2017-12-04 DIAGNOSIS — M159 Polyosteoarthritis, unspecified: Secondary | ICD-10-CM

## 2017-12-04 DIAGNOSIS — Z79899 Other long term (current) drug therapy: Secondary | ICD-10-CM

## 2017-12-04 DIAGNOSIS — K5903 Drug induced constipation: Secondary | ICD-10-CM | POA: Diagnosis not present

## 2017-12-04 DIAGNOSIS — M150 Primary generalized (osteo)arthritis: Principal | ICD-10-CM

## 2017-12-04 DIAGNOSIS — I10 Essential (primary) hypertension: Secondary | ICD-10-CM

## 2017-12-04 DIAGNOSIS — M8949 Other hypertrophic osteoarthropathy, multiple sites: Secondary | ICD-10-CM

## 2017-12-04 LAB — POCT GLYCOSYLATED HEMOGLOBIN (HGB A1C): Hemoglobin A1C: 6.7 % — AB (ref 4.0–5.6)

## 2017-12-04 MED ORDER — SACUBITRIL-VALSARTAN 24-26 MG PO TABS: 1.0000 | ORAL_TABLET | Freq: Two times a day (BID) | ORAL | 6 refills | Status: DC

## 2017-12-04 MED ORDER — LUBIPROSTONE 24 MCG PO CAPS: 24.0000 ug | ORAL_CAPSULE | Freq: Two times a day (BID) | ORAL | 1 refills | Status: DC

## 2017-12-04 NOTE — Telephone Encounter (Signed)
-----   Message from Laqueta Linden, MD sent at 12/03/2017  1:57 PM EDT ----- Cardiac function has declined.  If he still taking valsartan?  If not, start Entresto 24/26 mg twice daily.  Check a basic metabolic panel 5 days after initiation.

## 2017-12-04 NOTE — Progress Notes (Signed)
Subjective:  Patient ID: Cody Hale, male    DOB: 09-Mar-1951  Age: 67 y.o. MRN: [id number]  CC: Hypertension; Diabetes; and Constipation   HPI Cody Hale presents for f/up - He complains of chronic, intermittent constipation.  He thinks the Percocet makes it worse.  A few days ago he noticed an episode of bright red blood per rectum.  He has had some straining with bowel movements.  He denies abdominal pain, loss of appetite, or diarrhea.  He is tried prune juice but says it did not help.  Outpatient Medications Prior to Visit  Medication Sig Dispense Refill  . carvedilol (COREG) 6.25 MG tablet TAKE 1 TABLET BY MOUTH TWICE DAILY. 60 tablet 0  . fluticasone (FLONASE) 50 MCG/ACT nasal spray Place 2 sprays into both nostrils daily. 16 g 5  . guaiFENesin (ROBITUSSIN) 100 MG/5ML liquid Take 100 mg by mouth 3 (three) times daily as needed for cough.    . insulin lispro (HUMALOG) 100 UNIT/ML KiwkPen Inject 0.05 mLs (5 Units total) into the skin 3 (three) times daily. 15 mL 11  . INVOKANA 100 MG TABS tablet TAKE ONE TABLET BY MOUTH ONCE DAILY BEFORE BREAKFAST. 30 tablet 0  . LANTUS SOLOSTAR 100 UNIT/ML Solostar Pen INJECT 70 UNITS SUBCUTANEOUSLY AT 10pm. 15 mL 3  . levocetirizine (XYZAL) 5 MG tablet Take 1 tablet (5 mg total) by mouth every evening. 90 tablet 1  . metFORMIN (GLUCOPHAGE-XR) 750 MG 24 hr tablet Take 1 tablet (750 mg total) by mouth daily with breakfast. 180 tablet 1  . metolazone (ZAROXOLYN) 2.5 MG tablet Take 1 tablet (2.5 mg total) by mouth 2 (two) times a week. On Monday & Friday--take an extra 20 meq potassium on these two days with this medication 90 tablet 3  . Multiple Vitamin (DAILY VITE) TABS TAKE 1 TABLET BY MOUTH ONCE DAILY. 30 each 11  . nitroGLYCERIN (NITROSTAT) 0.4 MG SL tablet Place 1 tablet (0.4 mg total) under the tongue every 5 (five) minutes as needed for chest pain. Max 3 doses. 10 tablet 0  . NOVOFINE AUTOCOVER 30G X 8 MM MISC USE AS DIRECTED FOR INSULIN  ADMINISTRATION. 100 each 0  . oxyCODONE (OXY IR/ROXICODONE) 5 MG immediate release tablet Take 1 tablet (5 mg total) by mouth 2 (two) times daily as needed for severe pain. 35 tablet 0  . potassium chloride SA (K-DUR,KLOR-CON) 20 MEQ tablet Take 1 tablet (20 mEq total) by mouth daily. On Monday & Friday, take an extra tablet (20 meq) with your metolazone 2.5 mg 40 tablet 9  . rivaroxaban (XARELTO) 20 MG TABS tablet Take 1 tablet (20 mg total) by mouth daily. 90 tablet 1  . simvastatin (ZOCOR) 20 MG tablet Take 1 tablet (20 mg total) by mouth daily. 90 tablet 1  . torsemide (DEMADEX) 20 MG tablet Take 1 tablet (20 mg total) by mouth 2 (two) times daily. 180 tablet 3  . VIIBRYD 40 MG TABS TAKE 1 TABLET BY MOUTH ONCE DAILY. 30 tablet 0   No facility-administered medications prior to visit.     ROS Review of Systems  Constitutional: Negative for appetite change, diaphoresis and fatigue.  HENT: Negative.   Eyes: Negative for visual disturbance.  Respiratory: Negative for cough, chest tightness, shortness of breath and wheezing.   Cardiovascular: Negative for chest pain, palpitations and leg swelling.  Gastrointestinal: Positive for blood in stool and constipation. Negative for abdominal distention, abdominal pain, diarrhea and vomiting.  Endocrine: Negative.  Negative for polydipsia,  polyphagia and polyuria.  Genitourinary: Negative.  Negative for difficulty urinating and urgency.  Musculoskeletal: Positive for arthralgias. Negative for back pain, myalgias and neck pain.  Skin: Negative.  Negative for color change, pallor and rash.  Allergic/Immunologic: Negative.   Neurological: Negative.  Negative for dizziness, weakness and numbness.  Hematological: Negative for adenopathy. Does not bruise/bleed easily.  Psychiatric/Behavioral: Negative.     Objective:  BP 124/70 (BP Location: Left Arm, Patient Position: Sitting, Cuff Size: Large)   Pulse 64   Temp 97.8 F (36.6 C) (Oral)   Resp 16    Ht 5\' 9"  (1.753 m)   Wt 263 lb 4 oz (119.4 kg)   SpO2 97%   BMI 38.88 kg/m   BP Readings from Last 3 Encounters:  12/04/17 124/70  11/21/17 120/70  10/30/17 134/82    Wt Readings from Last 3 Encounters:  12/04/17 263 lb 4 oz (119.4 kg)  11/21/17 274 lb (124.3 kg)  10/30/17 270 lb (122.5 kg)    Physical Exam  Constitutional: He is oriented to person, place, and time. No distress.  HENT:  Mouth/Throat: Oropharynx is clear and moist. No oropharyngeal exudate.  Eyes: Conjunctivae are normal. No scleral icterus.  Neck: Normal range of motion. Neck supple. No JVD present. No thyromegaly present.  Cardiovascular: Normal rate, regular rhythm and normal heart sounds. Exam reveals no gallop.  No murmur heard. Pulmonary/Chest: Effort normal and breath sounds normal. He has no wheezes. He has no rales.  Abdominal: Soft. Normal appearance and bowel sounds are normal. He exhibits no mass. There is no hepatosplenomegaly. There is no tenderness. No hernia.  Musculoskeletal: Normal range of motion. He exhibits no edema, tenderness or deformity.  Lymphadenopathy:    He has no cervical adenopathy.  Neurological: He is alert and oriented to person, place, and time.  Skin: Skin is warm and dry. No rash noted. He is not diaphoretic. No pallor.  Vitals reviewed.   Lab Results  Component Value Date   WBC 3.8 (L) 07/31/2017   HGB 14.7 07/31/2017   HCT 45.2 07/31/2017   PLT (L) 07/31/2017    108.0 Result may be falsely decreased due to platelet clumping.   GLUCOSE 149 (H) 10/17/2017   CHOL 82 04/23/2017   TRIG 141.0 04/23/2017   HDL 34.10 (L) 04/23/2017   LDLDIRECT 84.2 01/31/2014   LDLCALC 20 04/23/2017   ALT 19 02/11/2017   AST 16 02/11/2017   NA 142 10/17/2017   K 3.6 10/17/2017   CL 99 10/17/2017   CREATININE 1.29 (H) 10/17/2017   BUN 21 10/17/2017   CO2 38 (H) 10/17/2017   TSH 0.80 12/31/2016   PSA 0.36 05/30/2014   INR 2.61 02/10/2017   HGBA1C 6.7 (A) 12/04/2017   MICROALBUR  4.0 (H) 07/14/2017    No results found.  Assessment & Plan:   Jayan was seen today for hypertension, diabetes and constipation.  Diagnoses and all orders for this visit:  Type 2 diabetes mellitus with complication, with long-term current use of insulin (HCC)- His A1c is at 6.7%.  His blood sugars are adequately well controlled. -     POCT glycosylated hemoglobin (Hb A1C)  Essential hypertension, malignant- His blood pressure is well controlled.  Therapeutic opioid-induced constipation (OIC) -     lubiprostone (AMITIZA) 24 MCG capsule; Take 1 capsule (24 mcg total) by mouth 2 (two) times daily with a meal.  Blood in stool, frank- He is due for his 10-year colonoscopy follow-up so have asked him to see  GI about this. -     Ambulatory referral to Gastroenterology   I am having Marqueze D. Kertz start on lubiprostone. I am also having him maintain his DAILY VITE, insulin lispro, fluticasone, nitroGLYCERIN, LANTUS SOLOSTAR, torsemide, oxyCODONE, levocetirizine, metFORMIN, simvastatin, rivaroxaban, carvedilol, INVOKANA, VIIBRYD, NOVOFINE AUTOCOVER, guaiFENesin, metolazone, and potassium chloride SA.  Meds ordered this encounter  Medications  . lubiprostone (AMITIZA) 24 MCG capsule    Sig: Take 1 capsule (24 mcg total) by mouth 2 (two) times daily with a meal.    Dispense:  180 capsule    Refill:  1     Follow-up: Return in about 6 months (around 06/06/2018).  Sanda Linger, MD

## 2017-12-04 NOTE — Telephone Encounter (Signed)
Start Entresto 24/26 mg twice daily.  Check a basic metabolic panel 5 days after initiation.

## 2017-12-04 NOTE — Telephone Encounter (Signed)
Pt is in an extended living facility. Spoke with Lupita Leash, RN)  And he is NOT currently taking Valsartan.

## 2017-12-04 NOTE — Telephone Encounter (Signed)
Oxycodone refill Last Refill:10/18/17 #35 Last OV: 12/04/17 PCP: Dr. Yetta Barre [facility]acy:[facility]

## 2017-12-04 NOTE — Patient Instructions (Signed)
Constipation, Adult Constipation is when a person has fewer bowel movements in a week than normal, has difficulty having a bowel movement, or has stools that are dry, hard, or larger than normal. Constipation may be caused by an underlying condition. It may become worse with age if a person takes certain medicines and does not take in enough fluids. Follow these instructions at home: Eating and drinking   Eat foods that have a lot of fiber, such as fresh fruits and vegetables, whole grains, and beans.  Limit foods that are high in fat, low in fiber, or overly processed, such as french fries, hamburgers, cookies, candies, and soda.  Drink enough fluid to keep your urine clear or pale yellow. General instructions  Exercise regularly or as told by your health care provider.  Go to the restroom when you have the urge to go. Do not hold it in.  Take over-the-counter and prescription medicines only as told by your health care provider. These include any fiber supplements.  Practice pelvic floor retraining exercises, such as deep breathing while relaxing the lower abdomen and pelvic floor relaxation during bowel movements.  Watch your condition for any changes.  Keep all follow-up visits as told by your health care provider. This is important. Contact a health care provider if:  You have pain that gets worse.  You have a fever.  You do not have a bowel movement after 4 days.  You vomit.  You are not hungry.  You lose weight.  You are bleeding from the anus.  You have thin, pencil-like stools. Get help right away if:  You have a fever and your symptoms suddenly get worse.  You leak stool or have blood in your stool.  Your abdomen is bloated.  You have severe pain in your abdomen.  You feel dizzy or you faint. This information is not intended to replace advice given to you by your health care provider. Make sure you discuss any questions you have with your health care  provider. Document Released: 03/22/2004 Document Revised: 01/12/2016 Document Reviewed: 12/13/2015 Elsevier Interactive Patient Education  2018 Elsevier Inc.  

## 2017-12-04 NOTE — Telephone Encounter (Signed)
Faxed to [facility] (script, doctor instructions and lab order) [phone] ATTN: [facility]

## 2017-12-04 NOTE — Telephone Encounter (Signed)
Copied from CRM [id number]. Topic: Quick Communication - Rx Refill/Question >> Dec 04, 2017  2:50 PM Alexander Bergeron B wrote: Medication: oxyCODONE (OXY IR/ROXICODONE) 5 MG immediate release tablet [[id number]]  Has the patient contacted their pharmacy? Yes.   (Agent: If no, request that the patient contact the pharmacy for the refill.) (Agent: If yes, when and what did the pharmacy advise?)  Preferred Pharmacy (with phone number or street name): [facility]  Agent: Please be advised that RX refills may take up to 3 business days. We ask that you follow-up with your pharmacy.

## 2017-12-05 DIAGNOSIS — R600 Localized edema: Secondary | ICD-10-CM | POA: Diagnosis not present

## 2017-12-05 DIAGNOSIS — I10 Essential (primary) hypertension: Secondary | ICD-10-CM | POA: Diagnosis not present

## 2017-12-05 DIAGNOSIS — I5022 Chronic systolic (congestive) heart failure: Secondary | ICD-10-CM | POA: Diagnosis not present

## 2017-12-05 DIAGNOSIS — I429 Cardiomyopathy, unspecified: Secondary | ICD-10-CM | POA: Diagnosis not present

## 2017-12-05 DIAGNOSIS — Z79899 Other long term (current) drug therapy: Secondary | ICD-10-CM | POA: Diagnosis not present

## 2017-12-05 DIAGNOSIS — E7800 Pure hypercholesterolemia, unspecified: Secondary | ICD-10-CM | POA: Diagnosis not present

## 2017-12-05 LAB — BASIC METABOLIC PANEL
BUN/Creatinine Ratio: 25 (calc) — ABNORMAL HIGH (ref 6–22)
BUN: 33 mg/dL — ABNORMAL HIGH (ref 7–25)
CO2: 39 mmol/L — ABNORMAL HIGH (ref 20–32)
Calcium: 10 mg/dL (ref 8.6–10.3)
Chloride: 92 mmol/L — ABNORMAL LOW (ref 98–110)
Creat: 1.32 mg/dL — ABNORMAL HIGH (ref 0.70–1.25)
Glucose, Bld: 177 mg/dL — ABNORMAL HIGH (ref 65–99)
Potassium: 3.3 mmol/L — ABNORMAL LOW (ref 3.5–5.3)
Sodium: 141 mmol/L (ref 135–146)

## 2017-12-06 ENCOUNTER — Other Ambulatory Visit: Payer: Self-pay | Admitting: Internal Medicine

## 2017-12-06 DIAGNOSIS — M150 Primary generalized (osteo)arthritis: Principal | ICD-10-CM

## 2017-12-06 DIAGNOSIS — M159 Polyosteoarthritis, unspecified: Secondary | ICD-10-CM

## 2017-12-06 DIAGNOSIS — M8949 Other hypertrophic osteoarthropathy, multiple sites: Secondary | ICD-10-CM

## 2017-12-06 MED ORDER — OXYCODONE HCL 5 MG PO TABS: 5.0000 mg | ORAL_TABLET | Freq: Two times a day (BID) | ORAL | 0 refills | Status: DC | PRN

## 2017-12-08 ENCOUNTER — Telehealth: Payer: Self-pay

## 2017-12-08 DIAGNOSIS — Z79899 Other long term (current) drug therapy: Secondary | ICD-10-CM

## 2017-12-08 MED ORDER — METOLAZONE 2.5 MG PO TABS: 2.5000 mg | ORAL_TABLET | ORAL | 3 refills | Status: DC

## 2017-12-08 NOTE — Telephone Encounter (Signed)
-----   Message from Laqueta Linden, MD sent at 12/08/2017  1:20 PM EDT ----- Cut back metolazone to 2.5 mg once per week and repeat BMET in 2 weeks.

## 2017-12-08 NOTE — Telephone Encounter (Signed)
I spoke with Kennyth Arnold at patients facility, faxed orders [phone]

## 2017-12-10 DIAGNOSIS — Z79899 Other long term (current) drug therapy: Secondary | ICD-10-CM | POA: Diagnosis not present

## 2017-12-10 LAB — BASIC METABOLIC PANEL
BUN/Creatinine Ratio: 31 (calc) — ABNORMAL HIGH (ref 6–22)
BUN: 50 mg/dL — ABNORMAL HIGH (ref 7–25)
CO2: 39 mmol/L — ABNORMAL HIGH (ref 20–32)
Calcium: 9.4 mg/dL (ref 8.6–10.3)
Chloride: 89 mmol/L — ABNORMAL LOW (ref 98–110)
Creat: 1.6 mg/dL — ABNORMAL HIGH (ref 0.70–1.25)
Glucose, Bld: 144 mg/dL — ABNORMAL HIGH (ref 65–139)
Potassium: 3.3 mmol/L — ABNORMAL LOW (ref 3.5–5.3)
Sodium: 136 mmol/L (ref 135–146)

## 2017-12-11 ENCOUNTER — Telehealth: Payer: Self-pay

## 2017-12-11 ENCOUNTER — Telehealth: Payer: Self-pay | Admitting: Internal Medicine

## 2017-12-11 DIAGNOSIS — Z79899 Other long term (current) drug therapy: Secondary | ICD-10-CM

## 2017-12-11 NOTE — Telephone Encounter (Signed)
I spoke with Kennyth Arnold at [facility] and faxed  Lab results, orders and lab slip over

## 2017-12-11 NOTE — Telephone Encounter (Signed)
Copied from CRM [id number]. Topic: Quick Communication - Rx Refill/Question >> Dec 11, 2017 11:55 AM Stephannie Li, NT wrote: Medication:  lubiprostone (AMITIZA) 24 MCG capsule   Has the patient contacted their pharmacy? (Agent: If no, request that the patient contact the pharmacy for the refill. (Agent: If yes, when and what did the pharmacy advise?)  Preferred Pharmacy (with phone number or street name): [facility] - [place], [place] - [address] [phone] (Phone) [phone] (Fax The pharmacy called and says he was originally on  (AMITIZA 24 mcg capsules 2 x daily ,and then the facility requested it change to 1prn dose the pharmacy wants to know the  correct dose age please call [phone]     Agent: Please be advised that RX refills may take up to 3 business days. We ask that you follow-up with your pharmacy.

## 2017-12-11 NOTE — Telephone Encounter (Signed)
-----   Message from Laqueta Linden, MD sent at 12/10/2017  7:01 PM EDT ----- Have him hold metolazone altogether, and repeat BMET in 3 days. He need to double up on potassium for 2 days as well.

## 2017-12-11 NOTE — Telephone Encounter (Signed)
[facility] pharmacy and informed that the Amitiza sig has changed to 1 capsule prn constipation as pt is now experiencing diarrhea. I did inform the pharmacy that the sig will change back to 2 capsule prn constipation if/when patient begins to experience constipation again.

## 2017-12-15 DIAGNOSIS — Z79899 Other long term (current) drug therapy: Secondary | ICD-10-CM | POA: Diagnosis not present

## 2017-12-15 LAB — BASIC METABOLIC PANEL
BUN/Creatinine Ratio: 23 (calc) — ABNORMAL HIGH (ref 6–22)
BUN: 31 mg/dL — ABNORMAL HIGH (ref 7–25)
CO2: 37 mmol/L — ABNORMAL HIGH (ref 20–32)
Calcium: 9.4 mg/dL (ref 8.6–10.3)
Chloride: 97 mmol/L — ABNORMAL LOW (ref 98–110)
Creat: 1.33 mg/dL — ABNORMAL HIGH (ref 0.70–1.25)
Glucose, Bld: 87 mg/dL (ref 65–99)
Potassium: 4.1 mmol/L (ref 3.5–5.3)
Sodium: 140 mmol/L (ref 135–146)

## 2017-12-16 ENCOUNTER — Telehealth: Payer: Self-pay | Admitting: Internal Medicine

## 2017-12-16 MED ORDER — PERFLUTREN LIPID MICROSPHERE
1.0000 mL | INTRAVENOUS | Status: AC | PRN
Start: 2017-12-16 — End: 2017-12-16
  Administered 2017-12-02: 2 mL via INTRAVENOUS

## 2017-12-16 NOTE — Telephone Encounter (Signed)
Copied from CRM [id number]. Topic: Quick Communication - See Telephone Encounter >> Dec 16, 2017 12:04 PM Cipriano Bunker wrote: CRM for notification. See Telephone encounter for: 12/16/17.  Lupita Leash from [facility] [phone] , he is asking if he will be having surgery?   He was seen by Dr. Magnus Ivan - [facility] and thought he was to have surgery.  Checking on this.  Has had heart consult to get cleared and has not heard anything back.  She says she called [facility] and they didn't have surgery scheduled.  Please call

## 2017-12-16 NOTE — Telephone Encounter (Signed)
Dr Magnus Ivan [facility]) recommended that the patient have a right total hip arthroplasty. When they spoke with [name] earlier today, they were referred to Dr Eliberto Ivory surgery scheduler Cordelia Pen [phone]). This surgery was suppose to original scheduled in May but for whatever reason has not been at this time.

## 2017-12-18 ENCOUNTER — Other Ambulatory Visit: Payer: Self-pay | Admitting: Internal Medicine

## 2017-12-18 DIAGNOSIS — Z794 Long term (current) use of insulin: Principal | ICD-10-CM

## 2017-12-18 DIAGNOSIS — E118 Type 2 diabetes mellitus with unspecified complications: Secondary | ICD-10-CM

## 2017-12-19 ENCOUNTER — Ambulatory Visit: Payer: Self-pay

## 2017-12-19 NOTE — Telephone Encounter (Signed)
rec'd call from "Lupita Leash" at [facility].  Reported the pt. "went on an outing today."  Stated when he returned his legs were weeping.  Described that his bilat. legs are swelling from ankle to knees, and redness of skin is present from ankle to 1/2 way up the leg.  Reported this has been on ongoing problem.  Stated that the weeping of fluid is new today.  Denied  pt. C/o's pain, fever, shortness of breath, cough.  Reported that the weeping of fluid is draining from the lower portion of lower legs, where the redness is.  Advised the patient will need to be seen in the office.  The caller, Lupita Leash, stated that the facility needs 5 business days, to arrange transportation.  Reported they have no transportation services on Saturday.  Lupita Leash questioned why the doctor can't call something in for the weekend.  Advised the pt. needs to be evaluated, before treatment recommendations can be given.  Advised that the pt. Needs to be evaluated in UC today for the redness and weeping of his LE's, due to poss. Cellulitis; advised this needs to be addressed.  Did offer an appt. To f/u with PCP at a later date; stated she will call back to sched. future appt.  Stated she will send pt. To ER for current symptoms.              Reason for Disposition . [1] Red area or streak [2] large (> 2 in. or 5 cm)  Answer Assessment - Initial Assessment Questions 1. ONSET: "When did the swelling start?" (e.g., minutes, hours, days)     Ongoing swelling in bilat lower legs  2. LOCATION: "What part of the leg is swollen?"  "Are both legs swollen or just one leg?"     Ankles to knees, bilateral  3. SEVERITY: "How bad is the swelling?" (e.g., localized; mild, moderate, severe)  - Localized - small area of swelling localized to one leg  - MILD pedal edema - swelling limited to foot and ankle, pitting edema < 1/4 inch (6 mm) deep, rest and elevation eliminate most or all swelling  - MODERATE edema - swelling of lower leg  to knee, pitting edema > 1/4 inch (6 mm) deep, rest and elevation only partially reduce swelling  - SEVERE edema - swelling extends above knee, facial or hand swelling present      moderate 4. REDNESS: "Does the swelling look red or infected?"     Redness in bilat lower legs from ankle to calf 5. PAIN: "Is the swelling painful to touch?" If so, ask: "How painful is it?"   (Scale 1-10; mild, moderate or severe)    No c/o pain 6. FEVER: "Do you have a fever?" If so, ask: "What is it, how was it measured, and when did it start?"      No 7. CAUSE: "What do you think is causing the leg swelling?"    Ongoing; swelling of lower legs; redness from ankles to calves 8. MEDICAL HISTORY: "Do you have a history of heart failure, kidney disease, liver failure, or cancer?"     CHF, diabetes  9. RECURRENT SYMPTOM: "Have you had leg swelling before?" If so, ask: "When was the last time?" "What happened that time?"     Ongoing 10. OTHER SYMPTOMS: "Do you have any other symptoms?" (e.g., chest pain, difficulty breathing)      Reported no c/o chest pain or shortness of breath  Protocols used: LEG  SWELLING AND EDEMA-A-AH

## 2017-12-20 ENCOUNTER — Emergency Department (HOSPITAL_COMMUNITY)
Admission: EM | Admit: 2017-12-20 | Discharge: 2017-12-20 | Disposition: A | Discharge status: Home / Self Care | Payer: Medicare Other | Attending: Emergency Medicine | Admitting: Emergency Medicine

## 2017-12-20 ENCOUNTER — Other Ambulatory Visit: Payer: Self-pay

## 2017-12-20 ENCOUNTER — Encounter (HOSPITAL_COMMUNITY): Payer: Self-pay

## 2017-12-20 DIAGNOSIS — I5022 Chronic systolic (congestive) heart failure: Secondary | ICD-10-CM | POA: Insufficient documentation

## 2017-12-20 DIAGNOSIS — I110 Hypertensive heart disease with heart failure: Secondary | ICD-10-CM | POA: Insufficient documentation

## 2017-12-20 DIAGNOSIS — Z79899 Other long term (current) drug therapy: Secondary | ICD-10-CM | POA: Insufficient documentation

## 2017-12-20 DIAGNOSIS — L03116 Cellulitis of left lower limb: Secondary | ICD-10-CM | POA: Diagnosis not present

## 2017-12-20 DIAGNOSIS — Z87891 Personal history of nicotine dependence: Secondary | ICD-10-CM | POA: Diagnosis not present

## 2017-12-20 DIAGNOSIS — L03115 Cellulitis of right lower limb: Secondary | ICD-10-CM

## 2017-12-20 DIAGNOSIS — Z794 Long term (current) use of insulin: Secondary | ICD-10-CM | POA: Insufficient documentation

## 2017-12-20 DIAGNOSIS — M79662 Pain in left lower leg: Secondary | ICD-10-CM | POA: Diagnosis present

## 2017-12-20 DIAGNOSIS — E1143 Type 2 diabetes mellitus with diabetic autonomic (poly)neuropathy: Secondary | ICD-10-CM | POA: Insufficient documentation

## 2017-12-20 MED ORDER — DOXYCYCLINE HYCLATE 100 MG PO TABS
100.0000 mg | ORAL_TABLET | Freq: Once | ORAL | Status: AC
  Administered 2017-12-20: 100 mg via ORAL
  Filled 2017-12-20: qty 1

## 2017-12-20 MED ORDER — DOXYCYCLINE HYCLATE 100 MG PO CAPS: 100.0000 mg | ORAL_CAPSULE | Freq: Two times a day (BID) | ORAL | 0 refills | Status: DC

## 2017-12-20 NOTE — ED Triage Notes (Signed)
Pt is complaining of right leg redness. States he does have CHF and takes his Lasix appropriately. Redness started 3 days ago and is now itching. Pt has some slight redness to lower right leg as well as tightness to skin. No pitting edema noted. Pt declines fevers and chills.

## 2017-12-20 NOTE — Discharge Instructions (Addendum)
You have a skin infection called cellulitis.  Keep area clean with soap and water.  Clean socks.  Prescription for antibiotic.  Return if worse.
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
## 2017-12-22 NOTE — Telephone Encounter (Signed)
Routing to dr jones, fyi.... 

## 2017-12-23 DIAGNOSIS — R600 Localized edema: Secondary | ICD-10-CM | POA: Diagnosis not present

## 2017-12-23 DIAGNOSIS — H524 Presbyopia: Secondary | ICD-10-CM | POA: Diagnosis not present

## 2017-12-23 DIAGNOSIS — E7800 Pure hypercholesterolemia, unspecified: Secondary | ICD-10-CM | POA: Diagnosis not present

## 2017-12-23 DIAGNOSIS — Z79899 Other long term (current) drug therapy: Secondary | ICD-10-CM | POA: Diagnosis not present

## 2017-12-23 DIAGNOSIS — Z952 Presence of prosthetic heart valve: Secondary | ICD-10-CM | POA: Diagnosis not present

## 2017-12-23 DIAGNOSIS — H52223 Regular astigmatism, bilateral: Secondary | ICD-10-CM | POA: Diagnosis not present

## 2017-12-23 DIAGNOSIS — I482 Chronic atrial fibrillation: Secondary | ICD-10-CM | POA: Diagnosis not present

## 2017-12-23 DIAGNOSIS — I5022 Chronic systolic (congestive) heart failure: Secondary | ICD-10-CM | POA: Diagnosis not present

## 2017-12-23 DIAGNOSIS — I429 Cardiomyopathy, unspecified: Secondary | ICD-10-CM | POA: Diagnosis not present

## 2017-12-23 DIAGNOSIS — H5203 Hypermetropia, bilateral: Secondary | ICD-10-CM | POA: Diagnosis not present

## 2017-12-23 DIAGNOSIS — I10 Essential (primary) hypertension: Secondary | ICD-10-CM | POA: Diagnosis not present

## 2017-12-23 LAB — BASIC METABOLIC PANEL
BUN: 20 mg/dL (ref 7–25)
CO2: 36 mmol/L — ABNORMAL HIGH (ref 20–32)
Calcium: 9.4 mg/dL (ref 8.6–10.3)
Chloride: 106 mmol/L (ref 98–110)
Creat: 1.17 mg/dL (ref 0.70–1.25)
Glucose, Bld: 120 mg/dL — ABNORMAL HIGH (ref 65–99)
Potassium: 4.9 mmol/L (ref 3.5–5.3)
Sodium: 146 mmol/L (ref 135–146)

## 2017-12-25 ENCOUNTER — Telehealth: Payer: Self-pay | Admitting: *Deleted

## 2017-12-25 NOTE — Telephone Encounter (Signed)
Lupita Leash at [facility] informed.

## 2017-12-25 NOTE — Telephone Encounter (Signed)
-----   Message from Laqueta Linden, MD sent at 12/24/2017  1:04 PM EDT ----- Renal function has normalized.

## 2018-01-01 ENCOUNTER — Telehealth: Payer: Self-pay

## 2018-01-01 ENCOUNTER — Encounter: Payer: Self-pay | Admitting: Internal Medicine

## 2018-01-01 ENCOUNTER — Ambulatory Visit (INDEPENDENT_AMBULATORY_CARE_PROVIDER_SITE_OTHER): Payer: Medicare Other | Admitting: Internal Medicine

## 2018-01-01 VITALS — BP 118/68 | HR 65 | Temp 97.9°F | Resp 16 | Ht 69.0 in | Wt 272.0 lb

## 2018-01-01 DIAGNOSIS — K521 Toxic gastroenteritis and colitis: Secondary | ICD-10-CM | POA: Diagnosis not present

## 2018-01-01 DIAGNOSIS — I872 Venous insufficiency (chronic) (peripheral): Secondary | ICD-10-CM

## 2018-01-01 DIAGNOSIS — T3695XA Adverse effect of unspecified systemic antibiotic, initial encounter: Secondary | ICD-10-CM

## 2018-01-01 LAB — HM DIABETES FOOT EXAM

## 2018-01-01 MED ORDER — RESTORA PO CAPS: 1.0000 | ORAL_CAPSULE | Freq: Every day | ORAL | 1 refills | Status: DC

## 2018-01-01 MED ORDER — DIPHENOXYLATE-ATROPINE 2.5-0.025 MG PO TABS: 1.0000 | ORAL_TABLET | Freq: Four times a day (QID) | ORAL | 0 refills | Status: DC | PRN

## 2018-01-01 MED ORDER — VASCULERA PO TABS: 1.0000 | ORAL_TABLET | Freq: Every day | ORAL | 11 refills | Status: DC

## 2018-01-01 NOTE — Telephone Encounter (Signed)
Called Ed with [facility] - Ed stated that the [place] Palm will be too expensive and not covered by pt insurance.   Also, [facility] is requesting to substitute the Restora for Probiotic Lactobacillus (this one does not have the omega 3 in it).

## 2018-01-01 NOTE — Patient Instructions (Signed)
Diarrhea, Adult Diarrhea is frequent loose and watery bowel movements. Diarrhea can make you feel weak and cause you to become dehydrated. Dehydration can make you tired and thirsty, cause you to have a dry mouth, and decrease how often you urinate. Diarrhea typically lasts 2-3 days. However, it can last longer if it is a sign of something more serious. It is important to treat your diarrhea as told by your health care provider. Follow these instructions at home: Eating and drinking  Follow these recommendations as told by your health care provider:  Take an oral rehydration solution (ORS). This is a drink that is sold at pharmacies and retail stores.  Drink clear fluids, such as water, ice chips, diluted fruit juice, and low-calorie sports drinks.  Eat bland, easy-to-digest foods in small amounts as you are able. These foods include bananas, applesauce, rice, lean meats, toast, and crackers.  Avoid drinking fluids that contain a lot of sugar or caffeine, such as energy drinks, sports drinks, and soda.  Avoid alcohol.  Avoid spicy or fatty foods.  General instructions  Drink enough fluid to keep your urine clear or pale yellow.  Wash your hands often. If soap and water are not available, use hand sanitizer.  Make sure that all people in your household wash their hands well and often.  Take over-the-counter and prescription medicines only as told by your health care provider.  Rest at home while you recover.  Watch your condition for any changes.  Take a warm bath to relieve any burning or pain from frequent diarrhea episodes.  Keep all follow-up visits as told by your health care provider. This is important. Contact a health care provider if:  You have a fever.  Your diarrhea gets worse.  You have new symptoms.  You cannot keep fluids down.  You feel light-headed or dizzy.  You have a headache  You have muscle cramps. Get help right away if:  You have chest  pain.  You feel extremely weak or you faint.  You have bloody or black stools or stools that look like tar.  You have severe pain, cramping, or bloating in your abdomen.  You have trouble breathing or you are breathing very quickly.  Your heart is beating very quickly.  Your skin feels cold and clammy.  You feel confused.  You have signs of dehydration, such as: ? Dark urine, very little urine, or no urine. ? Cracked lips. ? Dry mouth. ? Sunken eyes. ? Sleepiness. ? Weakness. This information is not intended to replace advice given to you by your health care provider. Make sure you discuss any questions you have with your health care provider. Document Released: 06/14/2002 Document Revised: 11/02/2015 Document Reviewed: 02/28/2015 Elsevier Interactive Patient Education  2018 Elsevier Inc.  

## 2018-01-01 NOTE — Progress Notes (Signed)
Subjective:  Patient ID: Cody Hale, male    DOB: August 19, 1950  Age: 67 y.o. MRN: [id number]  CC: Diarrhea   HPI Willis D Buffalo presents for f/up - he was recently seen in the ED complaining of bilateral lower extremity redness, discomfort, itching, and irritation.  He is taken a 10-day course of doxycycline and has not noticed much improvement in his symptoms.  He also has chronic lower extremity edema that has not recently worsened.  Since starting the antibiotic he has had a episodes of diarrhea that he describes as a couple of watery stools per day.  He denies abdominal pain, bloody stool, melena, nausea, vomiting, fever, chills, or loss of appetite.  Outpatient Medications Prior to Visit  Medication Sig Dispense Refill  . carvedilol (COREG) 6.25 MG tablet TAKE 1 TABLET BY MOUTH TWICE DAILY. 60 tablet 0  . colchicine 0.6 MG tablet Take 0.6 mg by mouth 2 (two) times daily.    . fluticasone (FLONASE) 50 MCG/ACT nasal spray Place 2 sprays into both nostrils daily. 16 g 5  . insulin lispro (HUMALOG) 100 UNIT/ML KiwkPen Inject 0.05 mLs (5 Units total) into the skin 3 (three) times daily. 15 mL 11  . INVOKANA 100 MG TABS tablet TAKE ONE TABLET BY MOUTH ONCE DAILY BEFORE BREAKFAST. 30 tablet 0  . LANTUS SOLOSTAR 100 UNIT/ML Solostar Pen INJECT 70 UNITS SUBCUTANEOUSLY AT 10pm. 15 mL 3  . levocetirizine (XYZAL) 5 MG tablet Take 1 tablet (5 mg total) by mouth every evening. 90 tablet 1  . lubiprostone (AMITIZA) 24 MCG capsule Take 1 capsule (24 mcg total) by mouth 2 (two) times daily with a meal. 180 capsule 1  . metFORMIN (GLUCOPHAGE-XR) 750 MG 24 hr tablet TAKE 2 TABLETS BY MOUTH EACH MORNING WITH BREAKFAST. 60 tablet 5  . Multiple Vitamin (DAILY VITE) TABS TAKE 1 TABLET BY MOUTH ONCE DAILY. 30 each 11  . nitroGLYCERIN (NITROSTAT) 0.4 MG SL tablet Place 1 tablet (0.4 mg total) under the tongue every 5 (five) minutes as needed for chest pain. Max 3 doses. 10 tablet 0  . NOVOFINE AUTOCOVER 30G X  8 MM MISC USE AS DIRECTED FOR INSULIN ADMINISTRATION. 100 each 0  . NOVOLOG FLEXPEN 100 UNIT/ML FlexPen Inject 5 Units into the skin 3 (three) times daily.    Marland Kitchen oxyCODONE (OXY IR/ROXICODONE) 5 MG immediate release tablet Take 1 tablet (5 mg total) by mouth 2 (two) times daily as needed for severe pain. 35 tablet 0  . potassium chloride SA (K-DUR,KLOR-CON) 20 MEQ tablet Take 1 tablet (20 mEq total) by mouth daily. On Monday & Friday, take an extra tablet (20 meq) with your metolazone 2.5 mg 40 tablet 9  . sacubitril-valsartan (ENTRESTO) 24-26 MG Take 1 tablet by mouth 2 (two) times daily. 60 tablet 6  . simvastatin (ZOCOR) 20 MG tablet TAKE 1 TABLET BY MOUTH ONCE DAILY. 30 tablet 5  . torsemide (DEMADEX) 20 MG tablet Take 1 tablet (20 mg total) by mouth 2 (two) times daily. 180 tablet 3  . VIIBRYD 40 MG TABS TAKE 1 TABLET BY MOUTH ONCE DAILY. 30 tablet 0  . XARELTO 20 MG TABS tablet TAKE 1 TABLET BY MOUTH ONCE DAILY. 30 tablet 5  . doxycycline (VIBRAMYCIN) 100 MG capsule Take 1 capsule (100 mg total) by mouth 2 (two) times daily. (Patient not taking: Reported on 01/01/2018) 20 capsule 0  . guaiFENesin (ROBITUSSIN) 100 MG/5ML liquid Take 100 mg by mouth 3 (three) times daily as needed for cough.    Marland Kitchen  metolazone (ZAROXOLYN) 2.5 MG tablet Take 2.5 mg by mouth daily. Take 1 tablet by mouth once daily on Monday & Friday.  Changed on 12/10/2017     No facility-administered medications prior to visit.     ROS Review of Systems  Constitutional: Negative for chills, diaphoresis, fatigue and fever.  HENT: Negative.   Eyes: Negative for visual disturbance.  Respiratory: Negative for cough, chest tightness, shortness of breath and wheezing.   Cardiovascular: Positive for leg swelling. Negative for chest pain and palpitations.  Gastrointestinal: Positive for diarrhea. Negative for abdominal pain, blood in stool, constipation and nausea.  Endocrine: Negative for polydipsia, polyphagia and polyuria.    Genitourinary: Negative.  Negative for difficulty urinating, dysuria, hematuria and urgency.  Musculoskeletal: Positive for arthralgias. Negative for myalgias and neck pain.  Skin: Positive for color change and rash. Negative for pallor.  Neurological: Negative.  Negative for dizziness, weakness and light-headedness.  Hematological: Negative for adenopathy. Does not bruise/bleed easily.  Psychiatric/Behavioral: Negative.     Objective:  BP 118/68 (BP Location: Left Arm, Patient Position: Sitting, Cuff Size: Large)   Pulse 65   Temp 97.9 F (36.6 C) (Oral)   Resp 16   Ht 5\' 9"  (1.753 m)   Wt 272 lb (123.4 kg)   SpO2 97%   BMI 40.17 kg/m   BP Readings from Last 3 Encounters:  01/01/18 118/68  12/20/17 (!) 152/80  12/04/17 124/70    Wt Readings from Last 3 Encounters:  01/01/18 272 lb (123.4 kg)  12/20/17 263 lb (119.3 kg)  12/04/17 263 lb 4 oz (119.4 kg)    Physical Exam  Constitutional: He is oriented to person, place, and time.  Non-toxic appearance. He does not have a sickly appearance. He does not appear ill. No distress.  HENT:  Mouth/Throat: Oropharynx is clear and moist. No oropharyngeal exudate.  Eyes: Conjunctivae are normal. No scleral icterus.  Neck: Normal range of motion. Neck supple. No JVD present. No thyromegaly present.  Cardiovascular: Normal rate, regular rhythm and normal heart sounds. Exam reveals no friction rub.  No murmur heard. Pulmonary/Chest: Effort normal and breath sounds normal. No respiratory distress. He has no wheezes. He has no rales.  Abdominal: Soft. Normal appearance. He exhibits no mass. There is no hepatosplenomegaly. There is no tenderness.  Musculoskeletal: Normal range of motion. He exhibits edema. He exhibits no tenderness or deformity.       Legs: Lymphadenopathy:    He has no cervical adenopathy.  Neurological: He is alert and oriented to person, place, and time.  Skin: Rash noted. He is not diaphoretic. There is erythema. No  pallor.  Vitals reviewed.   Lab Results  Component Value Date   WBC 3.8 (L) 07/31/2017   HGB 14.7 07/31/2017   HCT 45.2 07/31/2017   PLT (L) 07/31/2017    108.0 Result may be falsely decreased due to platelet clumping.   GLUCOSE 120 (H) 12/23/2017   CHOL 82 04/23/2017   TRIG 141.0 04/23/2017   HDL 34.10 (L) 04/23/2017   LDLDIRECT 84.2 01/31/2014   LDLCALC 20 04/23/2017   ALT 19 02/11/2017   AST 16 02/11/2017   NA 146 12/23/2017   K 4.9 12/23/2017   CL 106 12/23/2017   CREATININE 1.17 12/23/2017   BUN 20 12/23/2017   CO2 36 (H) 12/23/2017   TSH 0.80 12/31/2016   PSA 0.36 05/30/2014   INR 2.61 02/10/2017   HGBA1C 6.7 (A) 12/04/2017   MICROALBUR 4.0 (H) 07/14/2017    No results  found.  Assessment & Plan:   Nocholas was seen today for diarrhea.  Diagnoses and all orders for this visit:  Antibiotic-associated diarrhea -     Probiotic Product (RESTORA) CAPS; Take 1 capsule by mouth daily. -     diphenoxylate-atropine (LOMOTIL) 2.5-0.025 MG tablet; Take 1 tablet by mouth 4 (four) times daily as needed for diarrhea or loose stools.  Morbid obesity (HCC)- He is working on his lifestyle modifications to lose weight.  Venous stasis dermatitis of both lower extremities- I have asked him to start taking vasculera.  This will help with the venous function, will reduce symptoms, and will help prevent complications such as ulcerations. -     Dietary Management Product (VASCULERA) TABS; Take 1 capsule by mouth daily.   I have discontinued Rosbel D. Carnero's guaiFENesin, metolazone, and doxycycline. I am also having him start on RESTORA, VASCULERA, and diphenoxylate-atropine. Additionally, I am having him maintain his DAILY VITE, insulin lispro, fluticasone, nitroGLYCERIN, LANTUS SOLOSTAR, torsemide, levocetirizine, carvedilol, INVOKANA, VIIBRYD, NOVOFINE AUTOCOVER, potassium chloride SA, lubiprostone, sacubitril-valsartan, oxyCODONE, metFORMIN, simvastatin, XARELTO, colchicine, and  NOVOLOG FLEXPEN.  Meds ordered this encounter  Medications  . Probiotic Product (RESTORA) CAPS    Sig: Take 1 capsule by mouth daily.    Dispense:  30 capsule    Refill:  1  . Dietary Management Product (VASCULERA) TABS    Sig: Take 1 capsule by mouth daily.    Dispense:  30 tablet    Refill:  11  . diphenoxylate-atropine (LOMOTIL) 2.5-0.025 MG tablet    Sig: Take 1 tablet by mouth 4 (four) times daily as needed for diarrhea or loose stools.    Dispense:  45 tablet    Refill:  0     Follow-up: Return in about 3 weeks (around 01/22/2018).  Sanda Linger, MD

## 2018-01-01 NOTE — Telephone Encounter (Signed)
Copied from CRM [id number]. Topic: General - Other >> Jan 01, 2018  3:18 PM Tamela Oddi wrote: Reason for CRM: Ed with [facility] called to request alternative medications for Dietary Management Product [facility]) TABS [[id number]] (costs over $2,000) and Probiotic Product (RESTORA) CAPS (would like to recommend a generic med that is not as expensive).  Please advise.  CB# [phone].

## 2018-01-02 ENCOUNTER — Other Ambulatory Visit (INDEPENDENT_AMBULATORY_CARE_PROVIDER_SITE_OTHER): Payer: Self-pay | Admitting: Physician Assistant

## 2018-01-02 ENCOUNTER — Encounter (HOSPITAL_COMMUNITY): Payer: Self-pay | Admitting: *Deleted

## 2018-01-05 ENCOUNTER — Other Ambulatory Visit: Payer: Self-pay | Admitting: Internal Medicine

## 2018-01-05 NOTE — Progress Notes (Signed)
  BMP 12-23-17 Epic   HGBA1C 12-04-17 Epic   ECHO 12-02-17 Epic   LOV CARDIOLGY: DR. Purvis Sheffield 11-21-17 Epic   EKG 02-10-17 Epic   CXR 02-10-17 Epic

## 2018-01-05 NOTE — Patient Instructions (Addendum)
Cody Hale  01/05/2018   Your procedure is scheduled on: 01/16/2018     Report to [facility] Main  Entrance    Report to admitting at    7:45 AM    Call this number if you have problems the morning of surgery [phone]     Remember: Do not eat food or drink liquids :After Midnight.     Take these medicines the morning of surgery with A SIP OF WATER:  Carvedilol ( Coreg), Colchicine, Flonase, Viibryd, Zocor, , Oxycodone if needed, Elsie Saas                                   You may not have any metal on your body including hair pins and              piercings  Do not wear jewelry,  lotions, powders or perfumes, deodorant                           Men may shave face and neck.   Do not bring valuables to the hospital. [facility] IS NOT             RESPONSIBLE   FOR VALUABLES.  Contacts, dentures or bridgework may not be worn into surgery.  Leave suitcase in the car. After surgery it may be brought to your room.       Special Instructions: Please send current MAR with last times medications given .                Please read over the following fact sheets you were given: _____________________________________________________________________             [facility] - Preparing for Surgery Before surgery, you can play an important role.  Because skin is not sterile, your skin needs to be as free of germs as possible.  You can reduce the number of germs on your skin by washing with CHG (chlorahexidine gluconate) soap before surgery.  CHG is an antiseptic cleaner which kills germs and bonds with the skin to continue killing germs even after washing. Please DO NOT use if you have an allergy to CHG or antibacterial soaps.  If your skin becomes reddened/irritated stop using the CHG and inform your nurse when you arrive at Short Stay. Do not shave (including legs and underarms) for at least 48 hours prior to the first CHG shower.  You may shave  your face/neck. Please follow these instructions carefully:  1.  Shower with CHG Soap the night before surgery and the  morning of Surgery.  2.  If you choose to wash your hair, wash your hair first as usual with your  normal  shampoo.  3.  After you shampoo, rinse your hair and body thoroughly to remove the  shampoo.                           4.  Use CHG as you would any other liquid soap.  You can apply chg directly  to the skin and wash                       Gently with a scrungie or clean washcloth.  5.  Apply  the CHG Soap to your body ONLY FROM THE NECK DOWN.   Do not use on face/ open                           Wound or open sores. Avoid contact with eyes, ears mouth and genitals (private parts).                       Wash face,  Genitals (private parts) with your normal soap.             6.  Wash thoroughly, paying special attention to the area where your surgery  will be performed.  7.  Thoroughly rinse your body with warm water from the neck down.  8.  DO NOT shower/wash with your normal soap after using and rinsing off  the CHG Soap.                9.  Pat yourself dry with a clean towel.            10.  Wear clean pajamas.            11.  Place clean sheets on your bed the night of your first shower and do not  sleep with pets. Day of Surgery : Do not apply any lotions/deodorants the morning of surgery.  Please wear clean clothes to the hospital/surgery center.  FAILURE TO FOLLOW THESE INSTRUCTIONS MAY RESULT IN THE CANCELLATION OF YOUR SURGERY PATIENT SIGNATURE_________________________________  NURSE SIGNATURE__________________________________  ________________________________________________________________________     How to Manage Your Diabetes Before and After Surgery  Why is it important to control my blood sugar before and after surgery? . Improving blood sugar levels before and after surgery helps healing and can limit problems. . A way of improving blood sugar  control is eating a healthy diet by: o  Eating less sugar and carbohydrates o  Increasing activity/exercise o  Talking with your doctor about reaching your blood sugar goals . High blood sugars (greater than 180 mg/dL) can raise your risk of infections and slow your recovery, so you will need to focus on controlling your diabetes during the weeks before surgery. . Make sure that the doctor who takes care of your diabetes knows about your planned surgery including the date and location.  How do I manage my blood sugar before surgery? . Check your blood sugar at least 4 times a day, starting 2 days before surgery, to make sure that the level is not too high or low. o Check your blood sugar the morning of your surgery when you wake up and every 2 hours until you get to the Short Stay unit. . If your blood sugar is less than 70 mg/dL, you will need to treat for low blood sugar: o Do not take insulin. o Treat a low blood sugar (less than 70 mg/dL) with  cup of clear juice (cranberry or apple), 4 glucose tablets, OR glucose gel. o Recheck blood sugar in 15 minutes after treatment (to make sure it is greater than 70 mg/dL). If your blood sugar is not greater than 70 mg/dL on recheck, call [phone] for further instructions. . Report your blood sugar to the short stay nurse when you get to Short Stay.  . If you are admitted to the hospital after surgery: o Your blood sugar will be checked by the staff and you will probably be given insulin after surgery (instead of oral  diabetes medicines) to make sure you have good blood sugar levels. o The goal for blood sugar control after surgery is 80-180 mg/dL.   WHAT DO I DO ABOUT MY DIABETES MEDICATION?  THE NIGHT BEFORE SURGERY on Thursday 01-15-18  take   1/2 of evening dose of Lantus Insulin   Do not take bedtime dose of Humalog Insulin       THE MORNING OF SURGERY on Friday 01-16-18  Do not take oral diabetes medicines (pills) !  If CBG (blood  sugar ) is greater then 220 mg/dl, may take 1/2 of usual correction dose of Humalog Insulin        Patient Signature:  Date:   Nurse Signature:  Date:     Reviewed and Endorsed by [facility] Patient Education Committee, August 2015  Incentive Spirometer  An incentive spirometer is a tool that can help keep your lungs clear and active. This tool measures how well you are filling your lungs with each breath. Taking long deep breaths may help reverse or decrease the chance of developing breathing (pulmonary) problems (especially infection) following:  A long period of time when you are unable to move or be active. BEFORE THE PROCEDURE   If the spirometer includes an indicator to show your best effort, your nurse or respiratory therapist will set it to a desired goal.  If possible, sit up straight or lean slightly forward. Try not to slouch.  Hold the incentive spirometer in an upright position. INSTRUCTIONS FOR USE  1. Sit on the edge of your bed if possible, or sit up as far as you can in bed or on a chair. 2. Hold the incentive spirometer in an upright position. 3. Breathe out normally. 4. Place the mouthpiece in your mouth and seal your lips tightly around it. 5. Breathe in slowly and as deeply as possible, raising the piston or the ball toward the top of the column. 6. Hold your breath for 3-5 seconds or for as long as possible. Allow the piston or ball to fall to the bottom of the column. 7. Remove the mouthpiece from your mouth and breathe out normally. 8. Rest for a few seconds and repeat Steps 1 through 7 at least 10 times every 1-2 hours when you are awake. Take your time and take a few normal breaths between deep breaths. 9. The spirometer may include an indicator to show your best effort. Use the indicator as a goal to work toward during each repetition. 10. After each set of 10 deep breaths, practice coughing to be sure your lungs are clear. If you have an incision (the  cut made at the time of surgery), support your incision when coughing by placing a pillow or rolled up towels firmly against it. Once you are able to get out of bed, walk around indoors and cough well. You may stop using the incentive spirometer when instructed by your caregiver.  RISKS AND COMPLICATIONS  Take your time so you do not get dizzy or light-headed.  If you are in pain, you may need to take or ask for pain medication before doing incentive spirometry. It is harder to take a deep breath if you are having pain. AFTER USE  Rest and breathe slowly and easily.  It can be helpful to keep track of a log of your progress. Your caregiver can provide you with a simple table to help with this. If you are using the spirometer at home, follow these instructions: [facility] MEDICAL CARE  IF:   You are having difficultly using the spirometer.  You have trouble using the spirometer as often as instructed.  Your pain medication is not giving enough relief while using the spirometer.  You develop fever of 100.5 F (38.1 C) or higher. SEEK IMMEDIATE MEDICAL CARE IF:   You cough up bloody sputum that had not been present before.  You develop fever of 102 F (38.9 C) or greater.  You develop worsening pain at or near the incision site. MAKE SURE YOU:   Understand these instructions.  Will watch your condition.  Will get help right away if you are not doing well or get worse. Document Released: 11/04/2006 Document Revised: 09/16/2011 Document Reviewed: 01/05/2007 [facility] Patient Information 2014 [place], [place].   ________________________________________________________________________

## 2018-01-06 ENCOUNTER — Other Ambulatory Visit: Payer: Self-pay | Admitting: Orthopaedic Surgery

## 2018-01-06 NOTE — Care Plan (Signed)
Patient resides at [facility]. I have spoke with Marcine Matar regarding his discharge plan. He will be able to return there. They already assist him with basic ADL's and will continue. HHPT will come for 5 visits. Rolling walker ordered from [facility] for hospital deliver. '  HHPT - [facility]  OPPT - will be determined at Post op visit  MD Follow up:  Appointment not yet set. Will be within 2 weeks post op   Please contact Shauna Hugh, RNCM [phone], with questions or if this plan needs to change Thanks

## 2018-01-07 ENCOUNTER — Other Ambulatory Visit: Payer: Self-pay | Admitting: Orthopaedic Surgery

## 2018-01-07 NOTE — Care Plan (Signed)
Received a call back from Marcine Matar at [facility]. They are requiring a STSNF stay for rehab purposes prior to returning. Arrangements made for [facility].   Thanks

## 2018-01-09 ENCOUNTER — Encounter (HOSPITAL_COMMUNITY)
Admission: RE | Admit: 2018-01-09 | Discharge: 2018-01-09 | Disposition: A | Discharge status: Home / Self Care | Payer: Medicare Other | Source: Ambulatory Visit | Attending: Orthopaedic Surgery | Admitting: Orthopaedic Surgery

## 2018-01-09 ENCOUNTER — Encounter (HOSPITAL_COMMUNITY): Payer: Self-pay

## 2018-01-09 ENCOUNTER — Other Ambulatory Visit: Payer: Self-pay

## 2018-01-09 ENCOUNTER — Telehealth (INDEPENDENT_AMBULATORY_CARE_PROVIDER_SITE_OTHER): Payer: Self-pay | Admitting: Radiology

## 2018-01-09 DIAGNOSIS — E119 Type 2 diabetes mellitus without complications: Secondary | ICD-10-CM | POA: Diagnosis not present

## 2018-01-09 DIAGNOSIS — Z01812 Encounter for preprocedural laboratory examination: Secondary | ICD-10-CM | POA: Insufficient documentation

## 2018-01-09 LAB — CBC
HCT: 47 % (ref 39.0–52.0)
Hemoglobin: 15.2 g/dL (ref 13.0–17.0)
MCH: 31.3 pg (ref 26.0–34.0)
MCHC: 32.3 g/dL (ref 30.0–36.0)
MCV: 96.7 fL (ref 78.0–100.0)
Platelets: 106 10*3/uL — ABNORMAL LOW (ref 150–400)
RBC: 4.86 MIL/uL (ref 4.22–5.81)
RDW: 15.8 % — ABNORMAL HIGH (ref 11.5–15.5)
WBC: 4.2 10*3/uL (ref 4.0–10.5)

## 2018-01-09 LAB — BASIC METABOLIC PANEL
Anion gap: 8 (ref 5–15)
BUN: 18 mg/dL (ref 8–23)
CO2: 33 mmol/L — ABNORMAL HIGH (ref 22–32)
Calcium: 9.4 mg/dL (ref 8.9–10.3)
Chloride: 105 mmol/L (ref 98–111)
Creatinine, Ser: 1.29 mg/dL — ABNORMAL HIGH (ref 0.61–1.24)
GFR calc Af Amer: >60 mL/min (ref >60)
GFR calc non Af Amer: 56 mL/min — ABNORMAL LOW (ref >60)
Glucose, Bld: 94 mg/dL (ref 70–99)
Potassium: 4.3 mmol/L (ref 3.5–5.1)
Sodium: 146 mmol/L — ABNORMAL HIGH (ref 135–145)

## 2018-01-09 LAB — SURGICAL PCR SCREEN
MRSA, PCR: NEGATIVE
Staphylococcus aureus: NEGATIVE

## 2018-01-09 LAB — GLUCOSE, CAPILLARY: Glucose-Capillary: 104 mg/dL — ABNORMAL HIGH (ref 70–99)

## 2018-01-09 LAB — HEMOGLOBIN A1C
Hgb A1c MFr Bld: 7 % — ABNORMAL HIGH (ref 4.8–5.6)
Mean Plasma Glucose: 154.2 mg/dL

## 2018-01-09 NOTE — Telephone Encounter (Signed)
Received call from Eve, RN with [facility] in regards to Xarelto prior to surgery. She was doing pre-op work up with patient and could not find when patient needs to stop his Xarelto prior to total hip replacement on July 12. She states that the patient is living at [facility] and believes a person named Lupita Leash takes care of managing his medication and she does not have any information on the Xarelto either. Eve requests information be put into the chart so that patient's surgery is not cancelled by anesthesia. Patient has left their facility, so she requests we explain to patient what he needs to do as well.

## 2018-01-09 NOTE — Progress Notes (Signed)
PATIENT ON XARELTO AND UNAWARE OF EXACTLY HOW LONG TO STOP HIS XARELTO , IF NECESSARY, FOR SURGERY. RN CALLED OVER TO TRIAGE AT [facility] TO REQUEST XARELTO INSTRUCTIONS. TRIAGE TO MAKE SURGERY SCHEDULER OF DR C. Magnus Ivan AWARE AND STATES IT WILL EITHER POST TO Epic OR BE SENT VIA FAX. THIS RN PROVIDED FAX NUMBER AND DIRECT OFFICE NUMBER FOR CONTACT IF NECESSARY .

## 2018-01-09 NOTE — Telephone Encounter (Signed)
I spoke with Dr. Magnus Ivan, confirmed that on his surgery order it states that Mr. Gorbett needs to stop Xarelto 4 days before surgery.  I called [name] and spoke with Marcine Matar.  Advised to stop Xarelto 4 days before surgery.  She asked at I fax a written order to their facility. Written order faxed to [phone].

## 2018-01-12 ENCOUNTER — Ambulatory Visit: Payer: Self-pay | Admitting: Internal Medicine

## 2018-01-12 NOTE — Progress Notes (Signed)
CBC ROUTED VIA Epic TO DR Maureen Ralphs

## 2018-01-12 NOTE — Progress Notes (Signed)
XARELTO INSTRUCTIONS PER DR BLACKMAN ON CHART/EPIC  01-09-18

## 2018-01-15 MED ORDER — TRANEXAMIC ACID 1000 MG/10ML IV SOLN
1000.0000 mg | INTRAVENOUS | Status: AC
  Administered 2018-01-16: 1000 mg via INTRAVENOUS
  Filled 2018-01-15: qty 1100

## 2018-01-15 MED ORDER — DEXTROSE 5 % IV SOLN
3.0000 g | INTRAVENOUS | Status: AC
  Administered 2018-01-16: 3 g via INTRAVENOUS
  Filled 2018-01-15: qty 3

## 2018-01-16 ENCOUNTER — Encounter (HOSPITAL_COMMUNITY)
Admission: RE | Disposition: A | Discharge status: Skilled Nursing Facility | Payer: Self-pay | Source: Home / Self Care | Attending: Orthopaedic Surgery

## 2018-01-16 ENCOUNTER — Other Ambulatory Visit: Payer: Self-pay

## 2018-01-16 ENCOUNTER — Inpatient Hospital Stay (HOSPITAL_COMMUNITY): Payer: Medicare Other

## 2018-01-16 ENCOUNTER — Inpatient Hospital Stay (HOSPITAL_COMMUNITY): Payer: Medicare Other | Admitting: Anesthesiology

## 2018-01-16 ENCOUNTER — Inpatient Hospital Stay (HOSPITAL_COMMUNITY)
Admission: RE | Admit: 2018-01-16 | Discharge: 2018-01-19 | DRG: 470 | Disposition: A | Discharge status: Skilled Nursing Facility | Payer: Medicare Other | Attending: Orthopaedic Surgery | Admitting: Orthopaedic Surgery

## 2018-01-16 ENCOUNTER — Encounter (HOSPITAL_COMMUNITY): Payer: Self-pay | Admitting: *Deleted

## 2018-01-16 DIAGNOSIS — Z96641 Presence of right artificial hip joint: Secondary | ICD-10-CM

## 2018-01-16 DIAGNOSIS — Z87891 Personal history of nicotine dependence: Secondary | ICD-10-CM | POA: Diagnosis not present

## 2018-01-16 DIAGNOSIS — F329 Major depressive disorder, single episode, unspecified: Secondary | ICD-10-CM | POA: Diagnosis not present

## 2018-01-16 DIAGNOSIS — J301 Allergic rhinitis due to pollen: Secondary | ICD-10-CM | POA: Diagnosis present

## 2018-01-16 DIAGNOSIS — M10079 Idiopathic gout, unspecified ankle and foot: Secondary | ICD-10-CM | POA: Diagnosis present

## 2018-01-16 DIAGNOSIS — Z953 Presence of xenogenic heart valve: Secondary | ICD-10-CM

## 2018-01-16 DIAGNOSIS — I110 Hypertensive heart disease with heart failure: Secondary | ICD-10-CM | POA: Diagnosis present

## 2018-01-16 DIAGNOSIS — I5022 Chronic systolic (congestive) heart failure: Secondary | ICD-10-CM | POA: Diagnosis present

## 2018-01-16 DIAGNOSIS — Z8249 Family history of ischemic heart disease and other diseases of the circulatory system: Secondary | ICD-10-CM | POA: Diagnosis not present

## 2018-01-16 DIAGNOSIS — F419 Anxiety disorder, unspecified: Secondary | ICD-10-CM | POA: Diagnosis not present

## 2018-01-16 DIAGNOSIS — Z6841 Body Mass Index (BMI) 40.0 and over, adult: Secondary | ICD-10-CM

## 2018-01-16 DIAGNOSIS — E785 Hyperlipidemia, unspecified: Secondary | ICD-10-CM | POA: Diagnosis present

## 2018-01-16 DIAGNOSIS — I482 Chronic atrial fibrillation: Secondary | ICD-10-CM | POA: Diagnosis not present

## 2018-01-16 DIAGNOSIS — Z7401 Bed confinement status: Secondary | ICD-10-CM | POA: Diagnosis not present

## 2018-01-16 DIAGNOSIS — J449 Chronic obstructive pulmonary disease, unspecified: Secondary | ICD-10-CM | POA: Diagnosis present

## 2018-01-16 DIAGNOSIS — G4733 Obstructive sleep apnea (adult) (pediatric): Secondary | ICD-10-CM | POA: Diagnosis present

## 2018-01-16 DIAGNOSIS — Z91048 Other nonmedicinal substance allergy status: Secondary | ICD-10-CM | POA: Diagnosis not present

## 2018-01-16 DIAGNOSIS — M170 Bilateral primary osteoarthritis of knee: Secondary | ICD-10-CM | POA: Diagnosis present

## 2018-01-16 DIAGNOSIS — R262 Difficulty in walking, not elsewhere classified: Secondary | ICD-10-CM | POA: Diagnosis not present

## 2018-01-16 DIAGNOSIS — Z8261 Family history of arthritis: Secondary | ICD-10-CM | POA: Diagnosis not present

## 2018-01-16 DIAGNOSIS — I2510 Atherosclerotic heart disease of native coronary artery without angina pectoris: Secondary | ICD-10-CM | POA: Diagnosis present

## 2018-01-16 DIAGNOSIS — M2550 Pain in unspecified joint: Secondary | ICD-10-CM | POA: Diagnosis not present

## 2018-01-16 DIAGNOSIS — M1611 Unilateral primary osteoarthritis, right hip: Secondary | ICD-10-CM | POA: Diagnosis not present

## 2018-01-16 DIAGNOSIS — E1143 Type 2 diabetes mellitus with diabetic autonomic (poly)neuropathy: Secondary | ICD-10-CM | POA: Diagnosis present

## 2018-01-16 DIAGNOSIS — M6281 Muscle weakness (generalized): Secondary | ICD-10-CM | POA: Diagnosis not present

## 2018-01-16 DIAGNOSIS — I10 Essential (primary) hypertension: Secondary | ICD-10-CM | POA: Diagnosis not present

## 2018-01-16 DIAGNOSIS — F411 Generalized anxiety disorder: Secondary | ICD-10-CM | POA: Diagnosis not present

## 2018-01-16 DIAGNOSIS — I447 Left bundle-branch block, unspecified: Secondary | ICD-10-CM | POA: Diagnosis not present

## 2018-01-16 DIAGNOSIS — M25551 Pain in right hip: Secondary | ICD-10-CM | POA: Diagnosis not present

## 2018-01-16 DIAGNOSIS — Z888 Allergy status to other drugs, medicaments and biological substances status: Secondary | ICD-10-CM

## 2018-01-16 DIAGNOSIS — Z794 Long term (current) use of insulin: Secondary | ICD-10-CM

## 2018-01-16 DIAGNOSIS — Z419 Encounter for procedure for purposes other than remedying health state, unspecified: Secondary | ICD-10-CM

## 2018-01-16 DIAGNOSIS — Z471 Aftercare following joint replacement surgery: Secondary | ICD-10-CM | POA: Diagnosis not present

## 2018-01-16 HISTORY — PX: TOTAL HIP ARTHROPLASTY: SHX124

## 2018-01-16 HISTORY — DX: Sleep apnea, unspecified: G47.30

## 2018-01-16 HISTORY — DX: Unspecified atrial fibrillation: I48.91

## 2018-01-16 HISTORY — DX: Heart failure, unspecified: I50.9

## 2018-01-16 HISTORY — DX: Chronic obstructive pulmonary disease, unspecified: J44.9

## 2018-01-16 LAB — GLUCOSE, CAPILLARY
Glucose-Capillary: 134 mg/dL — ABNORMAL HIGH (ref 70–99)
Glucose-Capillary: 226 mg/dL — ABNORMAL HIGH (ref 70–99)
Glucose-Capillary: 251 mg/dL — ABNORMAL HIGH (ref 70–99)

## 2018-01-16 SURGERY — ARTHROPLASTY, HIP, TOTAL, ANTERIOR APPROACH
Anesthesia: General | Site: Hip | Laterality: Right

## 2018-01-16 MED ORDER — ONDANSETRON HCL 4 MG/2ML IJ SOLN
INTRAMUSCULAR | Status: AC
  Filled 2018-01-16: qty 2

## 2018-01-16 MED ORDER — DEXAMETHASONE SODIUM PHOSPHATE 10 MG/ML IJ SOLN
INTRAMUSCULAR | Status: DC | PRN
  Administered 2018-01-16: 10 mg via INTRAVENOUS

## 2018-01-16 MED ORDER — HYDROMORPHONE HCL 1 MG/ML IJ SOLN
0.5000 mg | INTRAMUSCULAR | Status: DC | PRN
  Administered 2018-01-16: 0.5 mg via INTRAVENOUS
  Administered 2018-01-17: 1 mg via INTRAVENOUS
  Filled 2018-01-16: qty 1

## 2018-01-16 MED ORDER — DIPHENHYDRAMINE HCL 12.5 MG/5ML PO ELIX: 12.5000 mg | ORAL_SOLUTION | ORAL | Status: DC | PRN

## 2018-01-16 MED ORDER — METHOCARBAMOL 1000 MG/10ML IJ SOLN
500.0000 mg | Freq: Four times a day (QID) | INTRAVENOUS | Status: DC | PRN
  Administered 2018-01-16: 500 mg via INTRAVENOUS
  Filled 2018-01-16: qty 550

## 2018-01-16 MED ORDER — OXYCODONE HCL 5 MG PO TABS
5.0000 mg | ORAL_TABLET | ORAL | Status: DC | PRN
  Administered 2018-01-16 – 2018-01-17 (×4): 10 mg via ORAL
  Administered 2018-01-18 – 2018-01-19 (×4): 5 mg via ORAL
  Filled 2018-01-16: qty 2
  Filled 2018-01-16 (×2): qty 1
  Filled 2018-01-16: qty 2
  Filled 2018-01-16 (×2): qty 1
  Filled 2018-01-16 (×2): qty 2

## 2018-01-16 MED ORDER — CARVEDILOL 6.25 MG PO TABS
6.2500 mg | ORAL_TABLET | Freq: Two times a day (BID) | ORAL | Status: DC
  Administered 2018-01-16 – 2018-01-19 (×4): 6.25 mg via ORAL
  Filled 2018-01-16 (×6): qty 1

## 2018-01-16 MED ORDER — HYDROMORPHONE HCL 1 MG/ML IJ SOLN
INTRAMUSCULAR | Status: AC
  Filled 2018-01-16: qty 1

## 2018-01-16 MED ORDER — SIMVASTATIN 20 MG PO TABS
20.0000 mg | ORAL_TABLET | Freq: Every day | ORAL | Status: DC
  Administered 2018-01-16 – 2018-01-19 (×4): 20 mg via ORAL
  Filled 2018-01-16 (×4): qty 1

## 2018-01-16 MED ORDER — HYDROMORPHONE HCL 2 MG/ML IJ SOLN
INTRAMUSCULAR | Status: AC
  Filled 2018-01-16: qty 1

## 2018-01-16 MED ORDER — SUGAMMADEX SODIUM 500 MG/5ML IV SOLN
INTRAVENOUS | Status: AC
  Filled 2018-01-16: qty 5

## 2018-01-16 MED ORDER — SODIUM CHLORIDE 0.9 % IV SOLN
INTRAVENOUS | Status: DC | PRN
  Administered 2018-01-16: 50 ug/min via INTRAVENOUS

## 2018-01-16 MED ORDER — ALUM & MAG HYDROXIDE-SIMETH 200-200-20 MG/5ML PO SUSP: 30.0000 mL | ORAL | Status: DC | PRN

## 2018-01-16 MED ORDER — LIDOCAINE 2% (20 MG/ML) 5 ML SYRINGE
INTRAMUSCULAR | Status: DC | PRN
  Administered 2018-01-16: 60 mg via INTRAVENOUS

## 2018-01-16 MED ORDER — LACTATED RINGERS IV SOLN
INTRAVENOUS | Status: DC
  Administered 2018-01-16 (×2): via INTRAVENOUS

## 2018-01-16 MED ORDER — FENTANYL CITRATE (PF) 100 MCG/2ML IJ SOLN
INTRAMUSCULAR | Status: AC
  Filled 2018-01-16: qty 2

## 2018-01-16 MED ORDER — PHENYLEPHRINE 40 MCG/ML (10ML) SYRINGE FOR IV PUSH (FOR BLOOD PRESSURE SUPPORT)
PREFILLED_SYRINGE | INTRAVENOUS | Status: DC | PRN
  Administered 2018-01-16 (×2): 80 ug via INTRAVENOUS

## 2018-01-16 MED ORDER — PHENYLEPHRINE 40 MCG/ML (10ML) SYRINGE FOR IV PUSH (FOR BLOOD PRESSURE SUPPORT)
PREFILLED_SYRINGE | INTRAVENOUS | Status: AC
  Filled 2018-01-16: qty 10

## 2018-01-16 MED ORDER — PROPOFOL 10 MG/ML IV BOLUS
INTRAVENOUS | Status: DC | PRN
  Administered 2018-01-16: 60 mg via INTRAVENOUS
  Administered 2018-01-16 (×2): 30 mg via INTRAVENOUS

## 2018-01-16 MED ORDER — MIDAZOLAM HCL 2 MG/2ML IJ SOLN
INTRAMUSCULAR | Status: AC
  Filled 2018-01-16: qty 2

## 2018-01-16 MED ORDER — MIDAZOLAM HCL 5 MG/5ML IJ SOLN
INTRAMUSCULAR | Status: DC | PRN
  Administered 2018-01-16: 2 mg via INTRAVENOUS

## 2018-01-16 MED ORDER — PHENOL 1.4 % MT LIQD
1.0000 | OROMUCOSAL | Status: DC | PRN
  Filled 2018-01-16: qty 177

## 2018-01-16 MED ORDER — OXYCODONE HCL 5 MG PO TABS
10.0000 mg | ORAL_TABLET | ORAL | Status: DC | PRN
  Administered 2018-01-16: 10 mg via ORAL
  Administered 2018-01-16: 15 mg via ORAL
  Administered 2018-01-18: 10 mg via ORAL
  Filled 2018-01-16: qty 2
  Filled 2018-01-16 (×2): qty 3
  Filled 2018-01-16: qty 2

## 2018-01-16 MED ORDER — METOCLOPRAMIDE HCL 5 MG/ML IJ SOLN: 5.0000 mg | Freq: Three times a day (TID) | INTRAMUSCULAR | Status: DC | PRN

## 2018-01-16 MED ORDER — ONDANSETRON HCL 4 MG/2ML IJ SOLN: 4.0000 mg | Freq: Four times a day (QID) | INTRAMUSCULAR | Status: DC | PRN

## 2018-01-16 MED ORDER — ONDANSETRON HCL 4 MG/2ML IJ SOLN
INTRAMUSCULAR | Status: DC | PRN
  Administered 2018-01-16: 4 mg via INTRAVENOUS

## 2018-01-16 MED ORDER — CANAGLIFLOZIN 100 MG PO TABS
100.0000 mg | ORAL_TABLET | Freq: Every day | ORAL | Status: DC
  Administered 2018-01-17 – 2018-01-19 (×3): 100 mg via ORAL
  Filled 2018-01-16 (×3): qty 1

## 2018-01-16 MED ORDER — ONDANSETRON HCL 4 MG PO TABS: 4.0000 mg | ORAL_TABLET | Freq: Four times a day (QID) | ORAL | Status: DC | PRN

## 2018-01-16 MED ORDER — ROCURONIUM BROMIDE 100 MG/10ML IV SOLN
INTRAVENOUS | Status: DC | PRN
  Administered 2018-01-16: 70 mg via INTRAVENOUS
  Administered 2018-01-16: 10 mg via INTRAVENOUS

## 2018-01-16 MED ORDER — PANTOPRAZOLE SODIUM 40 MG PO TBEC
40.0000 mg | DELAYED_RELEASE_TABLET | Freq: Every day | ORAL | Status: DC
  Administered 2018-01-16 – 2018-01-19 (×4): 40 mg via ORAL
  Filled 2018-01-16 (×4): qty 1

## 2018-01-16 MED ORDER — CHLORHEXIDINE GLUCONATE 4 % EX LIQD: 60.0000 mL | Freq: Once | CUTANEOUS | Status: DC

## 2018-01-16 MED ORDER — ACETAMINOPHEN 325 MG PO TABS: 325.0000 mg | ORAL_TABLET | Freq: Four times a day (QID) | ORAL | Status: DC | PRN

## 2018-01-16 MED ORDER — MENTHOL 3 MG MT LOZG: 1.0000 | LOZENGE | OROMUCOSAL | Status: DC | PRN

## 2018-01-16 MED ORDER — LEVOCETIRIZINE DIHYDROCHLORIDE 5 MG PO TABS: 5.0000 mg | ORAL_TABLET | Freq: Every evening | ORAL | Status: DC

## 2018-01-16 MED ORDER — INSULIN ASPART 100 UNIT/ML ~~LOC~~ SOLN
0.0000 [IU] | Freq: Every day | SUBCUTANEOUS | Status: DC
  Administered 2018-01-16 – 2018-01-18 (×2): 3 [IU] via SUBCUTANEOUS

## 2018-01-16 MED ORDER — VILAZODONE HCL 40 MG PO TABS
40.0000 mg | ORAL_TABLET | Freq: Every day | ORAL | Status: DC
Start: 2018-01-17 — End: 2018-01-19
  Administered 2018-01-17 – 2018-01-19 (×3): 40 mg via ORAL
  Filled 2018-01-16 (×3): qty 1

## 2018-01-16 MED ORDER — PROPOFOL 10 MG/ML IV BOLUS
INTRAVENOUS | Status: AC
  Filled 2018-01-16: qty 60

## 2018-01-16 MED ORDER — FLUTICASONE PROPIONATE 50 MCG/ACT NA SUSP
2.0000 | Freq: Every day | NASAL | Status: DC
  Administered 2018-01-17 – 2018-01-18 (×2): 2 via NASAL
  Filled 2018-01-16: qty 16

## 2018-01-16 MED ORDER — DOCUSATE SODIUM 100 MG PO CAPS
100.0000 mg | ORAL_CAPSULE | Freq: Two times a day (BID) | ORAL | Status: DC
  Administered 2018-01-16 – 2018-01-19 (×5): 100 mg via ORAL
  Filled 2018-01-16 (×6): qty 1

## 2018-01-16 MED ORDER — METHOCARBAMOL 500 MG PO TABS
500.0000 mg | ORAL_TABLET | Freq: Four times a day (QID) | ORAL | Status: DC | PRN
  Administered 2018-01-17 – 2018-01-18 (×2): 500 mg via ORAL
  Filled 2018-01-16 (×2): qty 1

## 2018-01-16 MED ORDER — FENTANYL CITRATE (PF) 100 MCG/2ML IJ SOLN
INTRAMUSCULAR | Status: DC | PRN
  Administered 2018-01-16 (×2): 50 ug via INTRAVENOUS
  Administered 2018-01-16: 100 ug via INTRAVENOUS

## 2018-01-16 MED ORDER — SODIUM CHLORIDE 0.9 % IV SOLN
INTRAVENOUS | Status: DC
  Administered 2018-01-16 – 2018-01-17 (×2): via INTRAVENOUS

## 2018-01-16 MED ORDER — HYDROMORPHONE HCL 1 MG/ML IJ SOLN
INTRAMUSCULAR | Status: DC | PRN
  Administered 2018-01-16: .4 mg via INTRAVENOUS

## 2018-01-16 MED ORDER — RIVAROXABAN 20 MG PO TABS
20.0000 mg | ORAL_TABLET | Freq: Every day | ORAL | Status: DC
  Administered 2018-01-17 – 2018-01-19 (×3): 20 mg via ORAL
  Filled 2018-01-16 (×3): qty 1

## 2018-01-16 MED ORDER — SODIUM CHLORIDE 0.9 % IR SOLN
Status: DC | PRN
  Administered 2018-01-16: 1000 mL

## 2018-01-16 MED ORDER — METFORMIN HCL ER 750 MG PO TB24
750.0000 mg | ORAL_TABLET | Freq: Every day | ORAL | Status: DC
  Administered 2018-01-17 – 2018-01-19 (×3): 750 mg via ORAL
  Filled 2018-01-16 (×3): qty 1

## 2018-01-16 MED ORDER — INSULIN ASPART 100 UNIT/ML ~~LOC~~ SOLN
0.0000 [IU] | Freq: Three times a day (TID) | SUBCUTANEOUS | Status: DC
  Administered 2018-01-16: 7 [IU] via SUBCUTANEOUS
  Administered 2018-01-17: 4 [IU] via SUBCUTANEOUS
  Administered 2018-01-17: 3 [IU] via SUBCUTANEOUS
  Administered 2018-01-17 – 2018-01-19 (×2): 4 [IU] via SUBCUTANEOUS

## 2018-01-16 MED ORDER — METOCLOPRAMIDE HCL 5 MG PO TABS: 5.0000 mg | ORAL_TABLET | Freq: Three times a day (TID) | ORAL | Status: DC | PRN

## 2018-01-16 MED ORDER — POLYETHYLENE GLYCOL 3350 17 G PO PACK: 17.0000 g | PACK | Freq: Every day | ORAL | Status: DC | PRN

## 2018-01-16 MED ORDER — DEXAMETHASONE SODIUM PHOSPHATE 10 MG/ML IJ SOLN
INTRAMUSCULAR | Status: AC
  Filled 2018-01-16: qty 1

## 2018-01-16 MED ORDER — SUGAMMADEX SODIUM 200 MG/2ML IV SOLN
INTRAVENOUS | Status: DC | PRN
  Administered 2018-01-16: 300 mg via INTRAVENOUS

## 2018-01-16 MED ORDER — ROCURONIUM BROMIDE 100 MG/10ML IV SOLN
INTRAVENOUS | Status: AC
  Filled 2018-01-16: qty 1

## 2018-01-16 MED ORDER — COLCHICINE 0.6 MG PO TABS
0.6000 mg | ORAL_TABLET | Freq: Two times a day (BID) | ORAL | Status: DC
  Administered 2018-01-16 – 2018-01-19 (×6): 0.6 mg via ORAL
  Filled 2018-01-16 (×6): qty 1

## 2018-01-16 MED ORDER — STERILE WATER FOR IRRIGATION IR SOLN
Status: DC | PRN
  Administered 2018-01-16: 2000 mL

## 2018-01-16 MED ORDER — CEFAZOLIN SODIUM-DEXTROSE 2-4 GM/100ML-% IV SOLN
2.0000 g | Freq: Four times a day (QID) | INTRAVENOUS | Status: AC
  Administered 2018-01-16 (×2): 2 g via INTRAVENOUS
  Filled 2018-01-16 (×2): qty 100

## 2018-01-16 MED ORDER — PHENYLEPHRINE HCL 10 MG/ML IJ SOLN
INTRAMUSCULAR | Status: AC
  Filled 2018-01-16: qty 1

## 2018-01-16 MED ORDER — LORATADINE 10 MG PO TABS
10.0000 mg | ORAL_TABLET | Freq: Every day | ORAL | Status: DC
  Administered 2018-01-16 – 2018-01-18 (×3): 10 mg via ORAL
  Filled 2018-01-16 (×3): qty 1

## 2018-01-16 SURGICAL SUPPLY — 40 items
BAG SPEC THK2 15X12 ZIP CLS (MISCELLANEOUS) ×1
BAG ZIPLOCK 12X15 (MISCELLANEOUS) ×1 IMPLANT
BLADE SAW SGTL 18X1.27X75 (BLADE) ×2 IMPLANT
CAPT HIP TOTAL 2 ×1 IMPLANT
COVER PERINEAL POST (MISCELLANEOUS) ×2 IMPLANT
COVER SURGICAL LIGHT HANDLE (MISCELLANEOUS) ×2 IMPLANT
DRAPE LG THREE QUARTER DISP (DRAPES) ×1 IMPLANT
DRAPE STERI IOBAN 125X83 (DRAPES) ×2 IMPLANT
DRAPE U-SHAPE 47X51 STRL (DRAPES) ×4 IMPLANT
DRESSING AQUACEL AG SP 3.5X10 (GAUZE/BANDAGES/DRESSINGS) IMPLANT
DRSG AQUACEL AG SP 3.5X10 (GAUZE/BANDAGES/DRESSINGS) ×2
DURAPREP 26ML APPLICATOR (WOUND CARE) ×2 IMPLANT
ELECT REM PT RETURN 15FT ADLT (MISCELLANEOUS) ×2 IMPLANT
GAUZE XEROFORM 1X8 LF (GAUZE/BANDAGES/DRESSINGS) ×1 IMPLANT
GLOVE BIO SURGEON STRL SZ7.5 (GLOVE) ×2 IMPLANT
GLOVE BIOGEL PI IND STRL 6.5 (GLOVE) IMPLANT
GLOVE BIOGEL PI IND STRL 7.5 (GLOVE) IMPLANT
GLOVE BIOGEL PI IND STRL 8 (GLOVE) ×2 IMPLANT
GLOVE BIOGEL PI INDICATOR 6.5 (GLOVE) ×1
GLOVE BIOGEL PI INDICATOR 7.5 (GLOVE) ×1
GLOVE BIOGEL PI INDICATOR 8 (GLOVE) ×3
GLOVE ECLIPSE 7.5 STRL STRAW (GLOVE) ×1 IMPLANT
GLOVE ECLIPSE 8.0 STRL XLNG CF (GLOVE) ×2 IMPLANT
GLOVE SURG SS PI 6.5 STRL IVOR (GLOVE) ×1 IMPLANT
GOWN STRL REUS W/ TWL LRG LVL3 (GOWN DISPOSABLE) IMPLANT
GOWN STRL REUS W/ TWL XL LVL3 (GOWN DISPOSABLE) IMPLANT
GOWN STRL REUS W/TWL LRG LVL3 (GOWN DISPOSABLE) ×2
GOWN STRL REUS W/TWL XL LVL3 (GOWN DISPOSABLE) ×6 IMPLANT
HANDPIECE INTERPULSE COAX TIP (DISPOSABLE) ×2
PACK ANTERIOR HIP CUSTOM (KITS) ×2 IMPLANT
SET HNDPC FAN SPRY TIP SCT (DISPOSABLE) ×1 IMPLANT
SPONGE LAP 18X18 X RAY DECT (DISPOSABLE) ×1 IMPLANT
STAPLER VISISTAT 35W (STAPLE) ×1 IMPLANT
SUT ETHIBOND NAB CT1 #1 30IN (SUTURE) ×2 IMPLANT
SUT ETHILON 2 0 PS N (SUTURE) ×1 IMPLANT
SUT VIC AB 0 CT1 36 (SUTURE) ×2 IMPLANT
SUT VIC AB 1 CT1 36 (SUTURE) ×2 IMPLANT
SUT VIC AB 2-0 CT1 27 (SUTURE) ×4
SUT VIC AB 2-0 CT1 TAPERPNT 27 (SUTURE) ×2 IMPLANT
YANKAUER SUCT BULB TIP 10FT TU (MISCELLANEOUS) ×2 IMPLANT

## 2018-01-16 NOTE — Care Plan (Signed)
I have spoke with Keri at [facility] and they are expecting patient when medically ready for discharge. He does not need at 3 day stay. He is an ortho bundle and is covered. The ALF and the SNF and the patient are aware of the plan. His equipment needs will be assessed prior to returning to the ALF.   Thank you

## 2018-01-16 NOTE — Progress Notes (Signed)
Care Plan Notes 10/18/2017 to 01/16/2018       Care Plan by Shauna Hugh at 01/16/2018  1:33 PM    Date of Service   Author Author Type Status Note Type File Time  01/16/2018  Shauna Hugh  Signed Care Plan 01/16/2018             I have spoke with Keri at [facility] and they are expecting patient when medically ready for discharge. He does not need at 3 day stay. He is an ortho bundle and is covered. The ALF and the SNF and the patient are aware of the plan. His equipment needs will be assessed prior to returning to the ALF.   Thank you             Care Plan by Shauna Hugh at 01/07/2018  5:25 PM    Date of Service   Author Author Type Status Note Type File Time  01/07/2018  Shauna Hugh  Signed Care Plan 01/07/2018             Received a call back from Marcine Matar at [facility]. They are requiring a STSNF stay for rehab purposes prior to returning. Arrangements made for [facility].   Thanks              Care Plan by Shauna Hugh at 01/06/2018 10:54 AM    Date of Service   Author Author Type Status Note Type File Time  01/06/2018  Shauna Hugh  Signed Care Plan 01/06/2018             Patient resides at [facility] I have spoke with Marcine Matar regarding his discharge plan. He will be able to return there. They already assist him with basic ADL's and will continue. HHPT will come for 5 visits. Rolling walker ordered from [facility] for hospital deliver. '  HHPT - [facility]  OPPT - will be determined at Post op visit  MD Follow up:  Appointment not yet set. Will be within 2 weeks post op   Please contact Shauna Hugh, RNCM [phone], with questions or if this plan needs to change Thanks

## 2018-01-16 NOTE — Op Note (Signed)
NAME: Cody Hale, Cody Hale MEDICAL RECORD [id number] ACCOUNT [id number] DATE OF BIRTH:07-02-1951 FACILITY: [facility] LOCATION: [facility] PHYSICIAN:Hoang Pettingill Aretha Parrot, MD  OPERATIVE REPORT  DATE OF PROCEDURE:  01/16/2018  PREOPERATIVE DIAGNOSIS:  Primary osteoarthritis and degenerative joint disease, right hip.  POSTOPERATIVE DIAGNOSIS:  Primary osteoarthritis and degenerative joint disease, right hip.  PROCEDURE:  Right total hip arthroplasty through direct anterior approach.  IMPLANTS:  DePuy Sector Gription acetabular component size 58, size 36+0 polyethylene liner, size 11 Corail femoral component with standard offset, size 36 -2 metal hip ball.  SURGEON:  Vanita Panda. Magnus Ivan, MD  ASSISTANT:  Richardean Canal, PA-C  ANESTHESIA:  General.  ANTIBIOTICS:  IV Ancef.  ESTIMATED BLOOD LOSS:  400 mL  COMPLICATIONS:  None.  INDICATIONS:  The patient is a 67 year old morbidly obese male with debilitating arthritis involving his right hip.  His pain has now become daily and it is detrimentally affecting his activities of daily living, quality of life and mobility.  At this  point, he does wish to proceed with a total hip arthroplasty through direct anterior approach.  He understands fully the risk of acute blood loss anemia, nerve or vessel injury, fracture, infection, dislocation, DVT.  He understands our goals are to  decrease pain, improve mobility and overall improve quality of life.  DESCRIPTION OF PROCEDURE:  After informed consent was obtained and appropriate right hip marked.  He was brought to the operating room and placed supine on the stretcher.  General anesthesia was then obtained.  We assessed his leg lengths and found it to  be short on his operative side then his non.  We placed traction boots on both his feet.  Next I placed him supine on the Hana fracture table with a perineal post in place and both legs in line skeletal traction device and no traction applied.  His   right operative hip was prepped and draped with DuraPrep and sterile drapes.  Time-out was called and he was identified, correct patient, correct right hip.  We then made an incision just inferior and posterior to the anterior superior iliac spine and  carried this obliquely down the leg.  We dissected down tensor fascia lata muscle.  Tensor fascia was then divided longitudinally to proceed with direct anterior approach to the hip.  We identified and cauterized circumflex vessels.  I then identified  the hip capsule, opened the hip capsule in an L-type format finding very large joint effusion and significant periarticular osteophytes around the femoral head and neck.  We placed Cobra retractors around the medial and lateral femoral neck and then made  our femoral neck cut with an oscillating saw and completed this with an osteotome.  We placed a corkscrew guide in the femoral head and removed the femoral head in its entirety and found it to be devoid of cartilage.  We then placed a bent Hohmann over  the medial acetabular rim and removed remnants of the acetabular labrum and other debris.  We then began reaming under direct visualization from a size 44 reamer then gently tapped increments all the way to a size 57 in stepwise increments with the last  reamer placed under direct fluoroscopy as well as direct visualization, so we could obtain our depth of reaming her inclination and anteversion.  Once I was pleased with this, I placed the real DePuy Sector Gription acetabular component size 58 and a  36+0 neutral polyethylene liner for that size 58 acetabular component.  Attention was then turned to the femur.  With the leg externally rotated to 120-130 degrees, extended and adducted, we were able to place a Mueller retractor medially and Homans  retractor behind the greater trochanter.  We have delayed release lateral joint capsule and used a box-cutting osteotome to enter femoral canal and a rongeur to  lateralize and then began broaching from a size 8 broach using the Corail broaching system  going up to a size 11.  With a size 11 in place, we trialed a standard offset femoral neck and with a 36 -2 trial hip ball due to the fact that he started off with shortness of tissues which were tight.  We reduced this in the acetabulum.  We were  pleased with the leg length, offset, range of motion and stability.  We then dislocated the hip and greater trochanter, removed the trial components.  We placed the real Corail femoral component size 11 with standard offset and the real 36 -2 metal hip  ball and again reduced the acetabulum and it felt stable.  We verified it again under clinical exam and fluoroscopy.  We then irrigated the soft tissues with normal saline solution using pulsatile lavage.  Closed the joint capsule with interrupted #1  Ethibond suture, followed by running 0 Vicryl in tensor fascia, 0 Vicryl in the deep tissue, 2-0 Vicryl subcutaneous tissue and interrupted staples on the skin.  Xeroform and Aquacel dressing was applied.  He was taken off the Hana table, awakened,  extubated, and taken to recovery room in stable condition.  All final counts were correct.  There were no complications noted.  Of note, Rexene Edison, PA-C, assisted in the entire case.  His assistance was crucial for facilitating all aspects of this case.  TN/NUANCE  D:01/16/2018 T:01/16/2018 JOB:[id number]

## 2018-01-16 NOTE — Anesthesia Postprocedure Evaluation (Addendum)
Anesthesia Post Note  Patient: Cody Hale  Procedure(s) Performed: RIGHT TOTAL HIP ARTHROPLASTY ANTERIOR APPROACH (Right Hip)     Patient location during evaluation: PACU Anesthesia Type: General Level of consciousness: awake and alert Pain management: pain level controlled Vital Signs Assessment: post-procedure vital signs reviewed and stable Respiratory status: spontaneous breathing, nonlabored ventilation, respiratory function stable and patient connected to nasal cannula oxygen Cardiovascular status: stable and blood pressure returned to baseline Postop Assessment: no apparent nausea or vomiting Anesthetic complications: no    Last Vitals:  Vitals:   01/16/18 1303 01/16/18 1355  BP: 129/75 (!) 156/62  Pulse: 77 63  Resp: 14 14  Temp: 36.9 C 37.4 C  SpO2: 96% 97%    Last Pain:  Vitals:   01/16/18 1355  TempSrc: Oral  PainSc:                  Cristela Stalder

## 2018-01-16 NOTE — Transfer of Care (Signed)
Immediate Anesthesia Transfer of Care Note  Patient: Cody Hale  Procedure(s) Performed: RIGHT TOTAL HIP ARTHROPLASTY ANTERIOR APPROACH (Right Hip)  Patient Location: PACU  Anesthesia Type:General  Level of Consciousness: sedated  Airway & Oxygen Therapy: Patient Spontanous Breathing and Patient connected to face mask oxygen  Post-op Assessment: Report given to RN and Post -op Vital signs reviewed and stable  Post vital signs: Reviewed and stable  Last Vitals:  Vitals Value Taken Time  BP 171/80 01/16/2018 11:52 AM  Temp    Pulse 81 01/16/2018 11:53 AM  Resp 15 01/16/2018 11:53 AM  SpO2 100 % 01/16/2018 11:53 AM  Vitals shown include unvalidated device data.  Last Pain:  Vitals:   01/16/18 0822  TempSrc:   PainSc: 0-No pain      Patients Stated Pain Goal: 3 (01/16/18 2229)  Complications: No apparent anesthesia complications

## 2018-01-16 NOTE — Anesthesia Preprocedure Evaluation (Addendum)
Anesthesia Evaluation  Patient identified by MRN, date of birth, ID band Patient awake    Reviewed: Allergy & Precautions, NPO status , Patient's Chart, lab work & pertinent test results, reviewed documented beta blocker date and time   History of Anesthesia Complications Negative for: history of anesthetic complications  Airway Mallampati: II  TM Distance: >3 FB Neck ROM: Full    Dental  (+) Missing, Dental Advisory Given, Poor Dentition,    Pulmonary sleep apnea , former smoker,    breath sounds clear to auscultation       Cardiovascular hypertension, Pt. on home beta blockers and Pt. on medications (-) angina+ CAD, + Peripheral Vascular Disease and +CHF  + dysrhythmias Atrial Fibrillation + Valvular Problems/Murmurs  Rhythm:Irregular     Neuro/Psych PSYCHIATRIC DISORDERS Anxiety Depression negative neurological ROS     GI/Hepatic negative GI ROS, Neg liver ROS,   Endo/Other  diabetes, Type 2, Insulin DependentMorbid obesity  Renal/GU CRFRenal disease     Musculoskeletal  (+) Arthritis ,   Abdominal   Peds  Hematology Xarelto for afib   Anesthesia Other Findings 5/19: Left ventricle: The cavity size was normal. Wall thickness was   increased in a pattern of moderate LVH. Images are limited even   with use of Definity contrast. Systolic function was moderately   to severely reduced. The estimated ejection fraction was in the   range of 30% to 35%. There is dyskinesis of the   mid-apicalanteroseptal myocardium. The study was not technically   sufficient to allow evaluation of LV diastolic dysfunction due to   atrial fibrillation. - Aortic valve: Mildly calcified annulus. Trileaflet; mildly   calcified leaflets. - Mitral valve: St. 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   There was no significant regurgitation or perivalvular   regurgitation. Valve area by pressure half-time: 1.58 cm^2. - Left atrium: The  atrium was mildly dilated. - Right atrium: Central venous pressure (est): 3 mm Hg. - Atrial septum: No defect or patent foramen ovale was identified. - Tricuspid valve: There was trivial regurgitation. - Pulmonary arteries: Systolic pressure could not be accurately   estimated. - Pericardium, extracardiac: There was no pericardial effusion.  Reproductive/Obstetrics                            Anesthesia Physical Anesthesia Plan  ASA: III  Anesthesia Plan: General   Post-op Pain Management:    Induction: Intravenous  PONV Risk Score and Plan: 2 and Dexamethasone and Ondansetron  Airway Management Planned: Oral ETT  Additional Equipment: None  Intra-op Plan:   Post-operative Plan: Extubation in OR  Informed Consent: I have reviewed the patients History and Physical, chart, labs and discussed the procedure including the risks, benefits and alternatives for the proposed anesthesia with the patient or authorized representative who has indicated his/her understanding and acceptance.   Dental advisory given  Plan Discussed with: Surgeon and CRNA  Anesthesia Plan Comments:         Anesthesia Quick Evaluation

## 2018-01-16 NOTE — Anesthesia Procedure Notes (Signed)
Procedure Name: Intubation Date/Time: 01/16/2018 10:01 AM Performed by: Florene Route, CRNA Patient Re-evaluated:Patient Re-evaluated prior to induction Preoxygenation: Pre-oxygenation with 100% oxygen Induction Type: IV induction Ventilation: Mask ventilation without difficulty and Oral airway inserted - appropriate to patient size Laryngoscope Size: Miller and 3 Grade View: Grade I Tube type: Oral Tube size: 8.0 mm Number of attempts: 1 Airway Equipment and Method: Stylet Placement Confirmation: ETT inserted through vocal cords under direct vision,  positive ETCO2 and breath sounds checked- equal and bilateral Secured at: 23 cm Tube secured with: Tape Dental Injury: Teeth and Oropharynx as per pre-operative assessment

## 2018-01-16 NOTE — Brief Op Note (Signed)
01/16/2018  11:27 AM  PATIENT:  Cody Hale  67 y.o. male  PRE-OPERATIVE DIAGNOSIS:  osteoarthritis right hip  POST-OPERATIVE DIAGNOSIS:  osteoarthritis right hip  PROCEDURE:  Procedure(s): RIGHT TOTAL HIP ARTHROPLASTY ANTERIOR APPROACH (Right)  SURGEON:  Surgeon(s) and Role:    Kathryne Hitch, MD - Primary  PHYSICIAN ASSISTANT: Rexene Edison, PA-C assisted  ANESTHESIA:   general  EBL:  400 mL   COUNTS:  YES   DICTATION: .Other Dictation: Dictation Number [id number]  PLAN OF CARE: Admit to inpatient   PATIENT DISPOSITION:  PACU - hemodynamically stable.   Delay start of Pharmacological VTE agent (>24hrs) due to surgical blood loss or risk of bleeding: no

## 2018-01-16 NOTE — Evaluation (Signed)
Physical Therapy Evaluation Patient Details Name: Cody Hale MRN: [id number] DOB: 01/23/1951 Today's Date: 01/16/2018   History of Present Illness  Pt s/p R THRand with hx of DM, COPD, A-fib, MDD, and mitral valve replacement  Clinical Impression  Pt s/p R THR and presents with decreased R LE strength/ROM and post op pain limiting functional mobility.  Pt would benefit from follow up rehab at SNF level to maximize IND and safety prior to return home alone.    Follow Up Recommendations SNF    Equipment Recommendations  None recommended by PT    Recommendations for Other Services       Precautions / Restrictions Precautions Precautions: Fall Restrictions Weight Bearing Restrictions: No RLE Weight Bearing: Weight bearing as tolerated      Mobility  Bed Mobility Overal bed mobility: Needs Assistance Bed Mobility: Supine to Sit     Supine to sit: Mod assist;+2 for physical assistance;+2 for safety/equipment     General bed mobility comments: cues for sequence and use of L LE to self assist; physical assist to manage R LE and to bring trunk to upright  Transfers Overall transfer level: Needs assistance Equipment used: Rolling walker (2 wheeled) Transfers: Sit to/from Stand Sit to Stand: Min assist;+2 physical assistance;+2 safety/equipment;From elevated surface         General transfer comment: cues for LE management and use of UEs to self assist  Ambulation/Gait Ambulation/Gait assistance: Min assist;+2 safety/equipment Gait Distance (Feet): 68 Feet Assistive device: Rolling walker (2 wheeled) Gait Pattern/deviations: Step-to pattern;Decreased step length - right;Decreased step length - left;Shuffle;Trunk flexed Gait velocity: decr   General Gait Details: cues for sequence, posture and position from RW`  Stairs            Wheelchair Mobility    Modified Rankin (Stroke Patients Only)       Balance Overall balance assessment: Needs  assistance Sitting-balance support: No upper extremity supported;Feet supported Sitting balance-Leahy Scale: Good     Standing balance support: Bilateral upper extremity supported Standing balance-Leahy Scale: Poor                               Pertinent Vitals/Pain Pain Assessment: 0-10 Pain Score: 4  Pain Location: R hip Pain Descriptors / Indicators: Aching;Sore Pain Intervention(s): Limited activity within patient's tolerance;Premedicated before session;Monitored during session;Ice applied    Home Living Family/patient expects to be discharged to:: Skilled nursing facility                 Additional Comments: [facility]    Prior Function Level of Independence: Independent with assistive device(s)         Comments: QC     Hand Dominance        Extremity/Trunk Assessment   Upper Extremity Assessment Upper Extremity Assessment: Overall WFL for tasks assessed    Lower Extremity Assessment Lower Extremity Assessment: RLE deficits/detail    Cervical / Trunk Assessment Cervical / Trunk Assessment: Kyphotic  Communication   Communication: No difficulties  Cognition Arousal/Alertness: Awake/alert Behavior During Therapy: WFL for tasks assessed/performed Overall Cognitive Status: Within Functional Limits for tasks assessed                                        General Comments      Exercises Total Joint Exercises Ankle Circles/Pumps: AROM;Both;15 reps;Supine  Assessment/Plan    PT Assessment Patient needs continued PT services  PT Problem List Decreased strength;Decreased range of motion;Decreased activity tolerance;Decreased balance;Decreased mobility;Decreased knowledge of use of DME;Pain;Obesity       PT Treatment Interventions DME instruction;Gait training;Stair training;Functional mobility training;Therapeutic activities;Therapeutic exercise;Patient/family education    PT Goals (Current goals can be found in  the Care Plan section)  Acute Rehab PT Goals Patient Stated Goal: Regain IND PT Goal Formulation: With patient Time For Goal Achievement: 01/23/18 Potential to Achieve Goals: Good    Frequency 7X/week   Barriers to discharge        Co-evaluation               AM-PAC PT "6 Clicks" Daily Activity  Outcome Measure Difficulty turning over in bed (including adjusting bedclothes, sheets and blankets)?: Unable Difficulty moving from lying on back to sitting on the side of the bed? : Unable Difficulty sitting down on and standing up from a chair with arms (e.g., wheelchair, bedside commode, etc,.)?: Unable Help needed moving to and from a bed to chair (including a wheelchair)?: A Lot Help needed walking in hospital room?: A Little Help needed climbing 3-5 steps with a railing? : A Lot 6 Click Score: 10    End of Session Equipment Utilized During Treatment: Gait belt Activity Tolerance: Patient tolerated treatment well Patient left: in chair;with call bell/phone within reach;with chair alarm set Nurse Communication: Mobility status PT Visit Diagnosis: Difficulty in walking, not elsewhere classified (R26.2)    Time: 1610-9604 PT Time Calculation (min) (ACUTE ONLY): 23 min   Charges:   PT Evaluation $PT Eval Low Complexity: 1 Low PT Treatments $Gait Training: 8-22 mins   PT G Codes:        Pg [phone]   Rajiv Parlato 01/16/2018, 5:54 PM
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
## 2018-01-17 LAB — CBC
HCT: 35.8 % — ABNORMAL LOW (ref 39.0–52.0)
Hemoglobin: 11.4 g/dL — ABNORMAL LOW (ref 13.0–17.0)
MCH: 31.1 pg (ref 26.0–34.0)
MCHC: 31.8 g/dL (ref 30.0–36.0)
MCV: 97.5 fL (ref 78.0–100.0)
Platelets: 102 10*3/uL — ABNORMAL LOW (ref 150–400)
RBC: 3.67 MIL/uL — ABNORMAL LOW (ref 4.22–5.81)
RDW: 15.7 % — ABNORMAL HIGH (ref 11.5–15.5)
WBC: 9 10*3/uL (ref 4.0–10.5)

## 2018-01-17 LAB — GLUCOSE, CAPILLARY
Glucose-Capillary: 156 mg/dL — ABNORMAL HIGH (ref 70–99)
Glucose-Capillary: 159 mg/dL — ABNORMAL HIGH (ref 70–99)
Glucose-Capillary: 146 mg/dL — ABNORMAL HIGH (ref 70–99)
Glucose-Capillary: 141 mg/dL — ABNORMAL HIGH (ref 70–99)

## 2018-01-17 LAB — BASIC METABOLIC PANEL
Anion gap: 6 (ref 5–15)
BUN: 20 mg/dL (ref 8–23)
CO2: 31 mmol/L (ref 22–32)
Calcium: 8.6 mg/dL — ABNORMAL LOW (ref 8.9–10.3)
Chloride: 104 mmol/L (ref 98–111)
Creatinine, Ser: 0.96 mg/dL (ref 0.61–1.24)
GFR calc Af Amer: >60 mL/min (ref >60)
GFR calc non Af Amer: >60 mL/min (ref >60)
Glucose, Bld: 169 mg/dL — ABNORMAL HIGH (ref 70–99)
Potassium: 4.9 mmol/L (ref 3.5–5.1)
Sodium: 141 mmol/L (ref 135–145)

## 2018-01-17 NOTE — Plan of Care (Signed)
Pt alert and oriented, resting with minimal complaints of pain this am.  Well controlled with PO pain meds.  Awaiting work with therapy.  RN will monitor.

## 2018-01-17 NOTE — Progress Notes (Signed)
Physical Therapy Treatment Patient Details Name: Cody Hale MRN: [id number] DOB: Nov 15, 1950 Today's Date: 01/17/2018    History of Present Illness Pt s/p R THRand with hx of DM, COPD, A-fib, MDD, and mitral valve replacement    PT Comments    Steady progress with mobility.  Continues to struggle with transitions/bed mobility 2* body habitus.     Follow Up Recommendations  SNF     Equipment Recommendations  None recommended by PT    Recommendations for Other Services       Precautions / Restrictions Precautions Precautions: Fall Restrictions Weight Bearing Restrictions: No RLE Weight Bearing: Weight bearing as tolerated    Mobility  Bed Mobility Overal bed mobility: Needs Assistance Bed Mobility: Sit to Supine     Supine to sit: Mod assist;+2 for physical assistance;+2 for safety/equipment Sit to supine: Min assist;Mod assist;+2 for safety/equipment;+2 for physical assistance   General bed mobility comments: cues for sequence and use of L LE to self assist; physical assist to manage R LE and to bring trunk to upright  Transfers Overall transfer level: Needs assistance Equipment used: Rolling walker (2 wheeled) Transfers: Sit to/from Stand Sit to Stand: Min assist;+2 safety/equipment         General transfer comment: cues for LE management and use of UEs to self assist  Ambulation/Gait Ambulation/Gait assistance: Min assist Gait Distance (Feet): 140 Feet Assistive device: Rolling walker (2 wheeled) Gait Pattern/deviations: Step-to pattern;Decreased step length - right;Decreased step length - left;Shuffle;Trunk flexed Gait velocity: decr   General Gait Details: cues for sequence, posture and position from RW`   Stairs             Wheelchair Mobility    Modified Rankin (Stroke Patients Only)       Balance Overall balance assessment: Needs assistance Sitting-balance support: No upper extremity supported;Feet supported Sitting balance-Leahy  Scale: Good     Standing balance support: Bilateral upper extremity supported Standing balance-Leahy Scale: Fair                              Cognition Arousal/Alertness: Awake/alert Behavior During Therapy: WFL for tasks assessed/performed Overall Cognitive Status: Within Functional Limits for tasks assessed                                        Exercises Total Joint Exercises Ankle Circles/Pumps: AROM;Both;15 reps;Supine Quad Sets: AROM;Both;10 reps;Supine Heel Slides: AAROM;Right;20 reps;Supine Hip ABduction/ADduction: AAROM;Right;15 reps;Supine    General Comments        Pertinent Vitals/Pain Pain Assessment: 0-10 Pain Score: 4  Pain Location: R hip Pain Descriptors / Indicators: Aching;Sore Pain Intervention(s): Limited activity within patient's tolerance;Monitored during session;Premedicated before session;Ice applied    Home Living                      Prior Function            PT Goals (current goals can now be found in the care plan section) Acute Rehab PT Goals Patient Stated Goal: Regain IND PT Goal Formulation: With patient Time For Goal Achievement: 01/23/18 Potential to Achieve Goals: Good Progress towards PT goals: Progressing toward goals    Frequency    7X/week      PT Plan Current plan remains appropriate    Co-evaluation  AM-PAC PT "6 Clicks" Daily Activity  Outcome Measure  Difficulty turning over in bed (including adjusting bedclothes, sheets and blankets)?: Unable Difficulty moving from lying on back to sitting on the side of the bed? : Unable Difficulty sitting down on and standing up from a chair with arms (e.g., wheelchair, bedside commode, etc,.)?: Unable Help needed moving to and from a bed to chair (including a wheelchair)?: A Lot Help needed walking in hospital room?: A Little Help needed climbing 3-5 steps with a railing? : A Lot 6 Click Score: 10    End of  Session Equipment Utilized During Treatment: Gait belt Activity Tolerance: Patient tolerated treatment well Patient left: in bed;with call bell/phone within reach Nurse Communication: Mobility status PT Visit Diagnosis: Difficulty in walking, not elsewhere classified (R26.2)     Time: 9629-5284 PT Time Calculation (min) (ACUTE ONLY): 25 min  Charges:  $Gait Training: 23-37 mins $Therapeutic Exercise: 8-22 mins $Therapeutic Activity: 8-22 mins                    G Codes:       Pg [phone]    Cody Hale 01/17/2018, 3:30 PM

## 2018-01-17 NOTE — Clinical Social Work Placement (Addendum)
   CLINICAL SOCIAL WORK PLACEMENT  NOTE  Date:  01/17/2018  Patient Details  Name: Cody Hale MRN: [id number] Date of Birth: [birth year]  Clinical Social Work is seeking post-discharge placement for this patient at the Skilled  Nursing Facility level of care (*CSW will initial, date and re-position this form in  chart as items are completed):  Yes   Patient/family provided with [facility] [place] Clinical Social Work Department's list of facilities offering this level of care within the geographic area requested by the patient (or if unable, by the patient's family).  Yes   Patient/family informed of their freedom to choose among providers that offer the needed level of care, that participate in Medicare, Medicaid or managed care program needed by the patient, have an available bed and are willing to accept the patient.  Yes   Patient/family informed of [facility]'s ownership interest in [facility] [place] and [facility], as well as of the fact that they are under no obligation to receive care at these facilities.  PASRR submitted to EDS on 01/17/18     PASRR number received on 01/17/18     Existing PASRR number confirmed on       FL2 transmitted to all facilities in geographic area requested by pt/family on 01/17/18     FL2 transmitted to all facilities within larger geographic area on 01/17/18     Patient informed that his/her managed care company has contracts with or will negotiate with certain facilities, including the following:            Patient/family informed of bed offers received.  Patient chooses bed at     [facility]   Physician recommends and patient chooses bed at     [facility] Patient to be transferred to   on  .  Patient to be transferred to facility by       Patient family notified on   of transfer.  Name of family member notified:        PHYSICIAN       Additional Comment:    _______________________________________________ Clearance Coots,  LCSW 01/17/2018, 9:48 AM

## 2018-01-17 NOTE — Discharge Instructions (Signed)

## 2018-01-17 NOTE — Clinical Social Work Note (Addendum)
Clinical Social Work Assessment  Patient Details  Name: Cody Hale MRN: [id number] Date of Birth: [birth year]  Date of referral:  01/17/18               Reason for consult:  Facility Placement, Discharge Planning                Permission sought to share information with:  Case Manager, Family Supports, Customer service manager Permission granted to share information::  Yes, Verbal Permission Granted  Name::       Janell Quiet   Agency::  SNF   Relationship::  Daughter   Contact Information:    [phone]  Housing/Transportation Living arrangements for the past 2 months:  [place] of Information:  Patient Patient Interpreter Needed:  None Criminal Activity/Legal Involvement Pertinent to Current Situation/Hospitalization:  No - Comment as needed Significant Relationships:  Adult Children, Siblings Lives with:  Facility Resident Do you feel safe going back to the place where you live?  Yes Need for family participation in patient care:  Yes (Comment)  Care giving concerns:   SNF placement for short rehab.   Social Worker assessment / plan:  CSW met with the patient at bedside to discuss discharge planning to SNF. Patient is lives in the  [facility], [place]. The patient reports he received assistance with bathing and dressing. They also prepare his meals and provide medications. Patient understands he will need physical therapy at SNF before returning to ALF. Patient reports prior to surgery he used a cane to assist with ambulation.   CSW explain SNF process. Patient reports he would like to go to [facility]. He reports prior to admission he spoke to staff at [facility] and they understand his need  for placement after surgery. CSW faxed patient clinicals to [facility].  Per Case Management-Patient is a Bundle, and will not need a 3 night stay.   fl2 completed.  PASRR done.   Plan: SNF Placement  Employment status:  Disabled  (Comment on whether or not currently receiving Disability) Insurance information:  Medicare PT Recommendations:  [place] / Referral to community resources:  [place]  Patient/Family's Response to care:  No family at bedside. Patient agreeable to placement.   Patient/Family's Understanding of and Emotional Response to Diagnosis, Current Treatment, and Prognosis:  Patient has a good understanding of his diagnois and follow up treatment at SNF.   Emotional Assessment Appearance:  Appears stated age Attitude/Demeanor/Rapport:    Affect (typically observed):  Accepting, Calm, Pleasant Orientation:  Oriented to Self, Oriented to Place, Oriented to  Time, Oriented to Situation Alcohol / Substance use:  Not Applicable Psych involvement (Current and /or in the community):  No (Comment)  Discharge Needs  Concerns to be addressed:  Decision making concerns, Discharge Planning Concerns Readmission within the last 30 days:  No Current discharge risk:  Dependent with Mobility Barriers to Discharge:  Continued Medical Work up   [employer], LCSW 01/17/2018, 9:26 AM

## 2018-01-17 NOTE — Progress Notes (Signed)
OT Cancellation Note  Patient Details Name: KHYAIR BROOKBANK MRN: [id number] DOB: [birth year]   Cancelled Treatment:    Reason Eval/Treat Not Completed: OT screened, no needs identified, will sign off.  Pt is scheduled for SNF placement and does not need OT eval for authorization.  All OT needs can be addressed at SNF.  Essie Gehret Hays, OTR/L [phone]   Jeani Hawking M 01/17/2018, 5:51 AM

## 2018-01-17 NOTE — NC FL2 (Addendum)
[place] MEDICAID FL2 LEVEL OF CARE SCREENING TOOL     IDENTIFICATION  Patient Name: Cody Hale Birthdate: [birth year] Sex: male Admission Date (Current Location): 01/16/2018  [facility] and [place] Number:  Producer, television/film/video and Address:  [facility],  [address] [place], [place] [address]      Provider Number: [id number]  Attending Physician Name and Address:  Kathryne Hitch  Relative Name and Phone Number:       Current Level of Care: Hospital Recommended Level of Care: Skilled Nursing Facility Prior Approval Number:    Date Approved/Denied:   PASRR Number:   [id number]  Discharge Plan: SNF    Current Diagnoses: Patient Active Problem List   Diagnosis Date Noted  . Status post total replacement of right hip 01/16/2018  . Antibiotic-associated diarrhea 01/01/2018  . Therapeutic opioid-induced constipation (OIC) 12/04/2017  . Venous stasis dermatitis of both lower extremities 07/31/2017  . Unilateral primary osteoarthritis, right hip 04/22/2017  . 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 Unilateral primary osteoarthritis, right knee 09/18/2016  . Seasonal allergic rhinitis due to pollen 03/13/2016  . Chronic systolic heart failure (HCC) 01/11/2015  . Chronic atrial fibrillation (HCC) 01/11/2015  . S/P mitral valve replacement with bioprosthetic valve 01/11/2015  . Depression with somatization 11/22/2014  . Primary osteoarthritis of both knees 11/22/2014  . COPD (chronic obstructive pulmonary disease) with chronic bronchitis (HCC) 05/12/2014  . B12 deficiency anemia 02/03/2014  . Routine general medical examination at a health care facility 03/21/2013  . Hyperlipidemia with target LDL less than 70 03/19/2013  . Essential hypertension, malignant 12/01/2012  . Type II diabetes mellitus with manifestations (HCC)   . Gastroparesis 11/30/2012  . OSA (obstructive sleep apnea) 11/16/2012  . Endocarditis-resolved 08/26/2012  . LBBB  (left bundle branch block) 07/03/2012  . Morbid obesity (HCC) 06/27/2012    Orientation RESPIRATION BLADDER Height & Weight     Self, Time, Situation, Place  O2(3L) Continent Weight: 276 lb (125.2 kg) Height:  5\' 9"  (175.3 cm)  BEHAVIORAL SYMPTOMS/MOOD NEUROLOGICAL BOWEL NUTRITION STATUS      Continent Diet(Carb Modified. )  AMBULATORY STATUS COMMUNICATION OF NEEDS Skin   Extensive Assist Verbally Surgical wounds(Incision-Hip )                       Personal Care Assistance Level of Assistance  Feeding, Dressing, Bathing Bathing Assistance: Limited assistance Feeding assistance: Independent Dressing Assistance: Limited assistance     Functional Limitations Info  Sight, Hearing, Speech Sight Info: Impaired(Wears Glasses ) Hearing Info: Adequate Speech Info: Adequate    SPECIAL CARE FACTORS FREQUENCY  PT (By licensed PT), OT (By licensed OT)     PT Frequency: 7X/WEEK  OT Frequency: 7X/WEEK             Contractures Contractures Info: Not present    Additional Factors Info  Code Status, Allergies, Insulin Sliding Scale Code Status Info: Fullcode  Allergies Info: Daptomycin, Lisinopril, Tape   Insulin Sliding Scale Info: 0-20 units 3x's a day.  0-5 units daily at bedtime        Current Medications (01/17/2018):  This is the current hospital active medication list Current Facility-Administered Medications  Medication Dose Route Frequency Provider Last Rate Last Dose  . 0.9 %  sodium chloride infusion   Intravenous Continuous Kathryne Hitch, MD 75 mL/hr at 01/17/18 0328    . acetaminophen (TYLENOL) tablet 325-650 mg  325-650 mg Oral Q6H PRN Doneen Poisson  Y, MD      . alum & mag hydroxide-simeth (MAALOX/MYLANTA) 200-200-20 MG/5ML suspension 30 mL  30 mL Oral Q4H PRN Kathryne Hitch, MD      . canagliflozin [facility]) tablet 100 mg  100 mg Oral QAC breakfast Kathryne Hitch, MD   100 mg at 01/17/18 0739  . carvedilol (COREG) tablet  6.25 mg  6.25 mg Oral BID Kathryne Hitch, MD   6.25 mg at 01/16/18 2155  . colchicine tablet 0.6 mg  0.6 mg Oral BID Kathryne Hitch, MD   0.6 mg at 01/16/18 2155  . diphenhydrAMINE (BENADRYL) 12.5 MG/5ML elixir 12.5-25 mg  12.5-25 mg Oral Q4H PRN Kathryne Hitch, MD      . docusate sodium (COLACE) capsule 100 mg  100 mg Oral BID Kathryne Hitch, MD   100 mg at 01/16/18 2155  . fluticasone (FLONASE) 50 MCG/ACT nasal spray 2 spray  2 spray Each Nare Daily Kathryne Hitch, MD      . HYDROmorphone (DILAUDID) injection 0.5-1 mg  0.5-1 mg Intravenous Q4H PRN Kathryne Hitch, MD   1 mg at 01/17/18 0258  . insulin aspart (novoLOG) injection 0-20 Units  0-20 Units Subcutaneous TID WC Kathryne Hitch, MD   4 Units at 01/17/18 0739  . insulin aspart (novoLOG) injection 0-5 Units  0-5 Units Subcutaneous QHS Kathryne Hitch, MD   3 Units at 01/16/18 2244  . loratadine (CLARITIN) tablet 10 mg  10 mg Oral q1800 Kathryne Hitch, MD   10 mg at 01/16/18 1725  . menthol-cetylpyridinium (CEPACOL) lozenge 3 mg  1 lozenge Oral PRN Kathryne Hitch, MD       Or  . phenol (CHLORASEPTIC) mouth spray 1 spray  1 spray Mouth/Throat PRN Kathryne Hitch, MD      . metFORMIN (GLUCOPHAGE-XR) 24 hr tablet 750 mg  750 mg Oral Q breakfast Kathryne Hitch, MD   750 mg at 01/17/18 0739  . methocarbamol (ROBAXIN) tablet 500 mg  500 mg Oral Q6H PRN Kathryne Hitch, MD       Or  . methocarbamol (ROBAXIN) 500 mg in dextrose 5 % 50 mL IVPB  500 mg Intravenous Q6H PRN Kathryne Hitch, MD   Stopped at 01/16/18 1259  . metoCLOPramide (REGLAN) tablet 5-10 mg  5-10 mg Oral Q8H PRN Kathryne Hitch, MD       Or  . metoCLOPramide (REGLAN) injection 5-10 mg  5-10 mg Intravenous Q8H PRN Kathryne Hitch, MD      . ondansetron [facility]) tablet 4 mg  4 mg Oral Q6H PRN Kathryne Hitch, MD       Or  . ondansetron  [facility]) injection 4 mg  4 mg Intravenous Q6H PRN Kathryne Hitch, MD      . oxyCODONE (Oxy IR/ROXICODONE) immediate release tablet 10-15 mg  10-15 mg Oral Q4H PRN Kathryne Hitch, MD   15 mg at 01/16/18 2155  . oxyCODONE (Oxy IR/ROXICODONE) immediate release tablet 5-10 mg  5-10 mg Oral Q4H PRN Kathryne Hitch, MD   10 mg at 01/17/18 0827  . pantoprazole (PROTONIX) EC tablet 40 mg  40 mg Oral Daily Kathryne Hitch, MD   40 mg at 01/16/18 1419  . polyethylene glycol (MIRALAX / GLYCOLAX) packet 17 g  17 g Oral Daily PRN Kathryne Hitch, MD      . rivaroxaban Carlena Hurl) tablet 20 mg  20 mg Oral Daily Kathryne Hitch,  MD      . simvastatin (ZOCOR) tablet 20 mg  20 mg Oral Daily Kathryne Hitch, MD   20 mg at 01/16/18 1419  . Vilazodone HCl (VIIBRYD) TABS 40 mg  40 mg Oral Daily Kathryne Hitch, MD         Discharge Medications: Please see discharge summary for a list of discharge medications.  Relevant Imaging Results:  Relevant Lab Results:   Additional Information ssn:[id number]  Clearance Coots, LCSW

## 2018-01-17 NOTE — Progress Notes (Signed)
Physical Therapy Treatment Patient Details Name: Cody Hale MRN: [id number] DOB: [birth year] Today's Date: 01/17/2018    History of Present Illness Pt s/p R THRand with hx of DM, COPD, A-fib, MDD, and mitral valve replacement    PT Comments    Pt motivated and progressing with mobility.  Pt limited primarily by body habitus.   Follow Up Recommendations  SNF     Equipment Recommendations  None recommended by PT    Recommendations for Other Services       Precautions / Restrictions Precautions Precautions: Fall Restrictions Weight Bearing Restrictions: No RLE Weight Bearing: Weight bearing as tolerated    Mobility  Bed Mobility Overal bed mobility: Needs Assistance Bed Mobility: Supine to Sit     Supine to sit: Mod assist;+2 for physical assistance;+2 for safety/equipment     General bed mobility comments: cues for sequence and use of L LE to self assist; physical assist to manage R LE and to bring trunk to upright  Transfers Overall transfer level: Needs assistance Equipment used: Rolling walker (2 wheeled) Transfers: Sit to/from Stand Sit to Stand: Min assist;+2 safety/equipment;From elevated surface         General transfer comment: cues for LE management and use of UEs to self assist  Ambulation/Gait Ambulation/Gait assistance: Min assist;+2 safety/equipment Gait Distance (Feet): 100 Feet Assistive device: Rolling walker (2 wheeled) Gait Pattern/deviations: Step-to pattern;Decreased step length - right;Decreased step length - left;Shuffle;Trunk flexed Gait velocity: decr   General Gait Details: cues for sequence, posture and position from RW`   Stairs             Wheelchair Mobility    Modified Rankin (Stroke Patients Only)       Balance Overall balance assessment: Needs assistance Sitting-balance support: No upper extremity supported;Feet supported Sitting balance-Leahy Scale: Good     Standing balance support: Bilateral upper  extremity supported Standing balance-Leahy Scale: Poor                              Cognition Arousal/Alertness: Awake/alert Behavior During Therapy: WFL for tasks assessed/performed Overall Cognitive Status: Within Functional Limits for tasks assessed                                        Exercises Total Joint Exercises Ankle Circles/Pumps: AROM;Both;15 reps;Supine Quad Sets: AROM;Both;10 reps;Supine Heel Slides: AAROM;Right;20 reps;Supine Hip ABduction/ADduction: AAROM;Right;15 reps;Supine    General Comments        Pertinent Vitals/Pain Pain Assessment: 0-10 Pain Score: 4  Pain Location: R hip Pain Descriptors / Indicators: Aching;Sore Pain Intervention(s): Limited activity within patient's tolerance;Monitored during session;Premedicated before session;Ice applied    Home Living                      Prior Function            PT Goals (current goals can now be found in the care plan section) Acute Rehab PT Goals Patient Stated Goal: Regain IND PT Goal Formulation: With patient Time For Goal Achievement: 01/23/18 Potential to Achieve Goals: Good Progress towards PT goals: Progressing toward goals    Frequency    7X/week      PT Plan Current plan remains appropriate    Co-evaluation              AM-PAC PT "6 Clicks"  Daily Activity  Outcome Measure  Difficulty turning over in bed (including adjusting bedclothes, sheets and blankets)?: Unable Difficulty moving from lying on back to sitting on the side of the bed? : Unable Difficulty sitting down on and standing up from a chair with arms (e.g., wheelchair, bedside commode, etc,.)?: Unable Help needed moving to and from a bed to chair (including a wheelchair)?: A Lot Help needed walking in hospital room?: A Little Help needed climbing 3-5 steps with a railing? : A Lot 6 Click Score: 10    End of Session Equipment Utilized During Treatment: Gait belt Activity  Tolerance: Patient tolerated treatment well Patient left: in chair;with call bell/phone within reach;with chair alarm set Nurse Communication: Mobility status PT Visit Diagnosis: Difficulty in walking, not elsewhere classified (R26.2)     Time: 1117-3567 PT Time Calculation (min) (ACUTE ONLY): 38 min  Charges:  $Gait Training: 8-22 mins $Therapeutic Exercise: 8-22 mins $Therapeutic Activity: 8-22 mins                    G Codes:       Pg [phone]    Muneeb Veras 01/17/2018, 12:22 PM

## 2018-01-17 NOTE — Progress Notes (Signed)
Subjective: 1 Day Post-Op Procedure(s) (LRB): RIGHT TOTAL HIP ARTHROPLASTY ANTERIOR APPROACH (Right) Patient reports pain as moderate.    Objective: Vital signs in last 24 hours: Temp:  [97.8 F (36.6 C)-99.4 F (37.4 C)] 98.1 F (36.7 C) (07/13 1037) Pulse Rate:  [53-105] 74 (07/13 1037) Resp:  [10-21] 18 (07/13 1037) BP: (121-171)/(59-107) 133/63 (07/13 1037) SpO2:  [95 %-100 %] 95 % (07/13 1037)  Intake/Output from previous day: 07/12 0701 - 07/13 0700 In: 3011.9 [P.O.:840; I.V.:2096.3; IV Piggyback:75.7] Out: 1850 [Urine:1100; Blood:750] Intake/Output this shift: No intake/output data recorded.  Recent Labs    01/17/18 0505  HGB 11.4*   Recent Labs    01/17/18 0505  WBC 9.0  RBC 3.67*  HCT 35.8*  PLT 102*   Recent Labs    01/17/18 0505  NA 141  K 4.9  CL 104  CO2 31  BUN 20  CREATININE 0.96  GLUCOSE 169*  CALCIUM 8.6*   No results for input(s): LABPT, INR in the last 72 hours.  Sensation intact distally Intact pulses distally Dorsiflexion/Plantar flexion intact Incision: scant drainage   Assessment/Plan: 1 Day Post-Op Procedure(s) (LRB): RIGHT TOTAL HIP ARTHROPLASTY ANTERIOR APPROACH (Right) Up with therapy Discharge to [facility] Monday.    Kathryne Hitch 01/17/2018, 10:44 AM

## 2018-01-18 LAB — GLUCOSE, CAPILLARY
Glucose-Capillary: 160 mg/dL — ABNORMAL HIGH (ref 70–99)
Glucose-Capillary: 144 mg/dL — ABNORMAL HIGH (ref 70–99)
Glucose-Capillary: 128 mg/dL — ABNORMAL HIGH (ref 70–99)
Glucose-Capillary: 279 mg/dL — ABNORMAL HIGH (ref 70–99)

## 2018-01-18 NOTE — Progress Notes (Signed)
Physical Therapy Treatment Patient Details Name: Cody Hale MRN: [id number] DOB: [birth year] Today's Date: 01/18/2018    History of Present Illness Pt s/p R THRand with hx of DM, COPD, A-fib, MDD, and mitral valve replacement    PT Comments    Progressing slowly with mobility. Continue to recommend SNF   Follow Up Recommendations  Follow surgeon's recommendation for DC plan and follow-up therapies(SNF)     Equipment Recommendations  None recommended by PT    Recommendations for Other Services       Precautions / Restrictions Precautions Precautions: Fall Restrictions Weight Bearing Restrictions: No RLE Weight Bearing: Weight bearing as tolerated    Mobility  Bed Mobility               General bed mobility comments: oob in recliner  Transfers Overall transfer level: Needs assistance Equipment used: Rolling walker (2 wheeled) Transfers: Sit to/from Stand Sit to Stand: Mod assist         General transfer comment: Assist to rise, stabilize, control descent. VCs safety, hand/LE placement. Increased time from low recliner  Ambulation/Gait Ambulation/Gait assistance: Min assist Gait Distance (Feet): 65 Feet Assistive device: Rolling walker (2 wheeled) Gait Pattern/deviations: Step-to pattern;Antalgic     General Gait Details: Assist to stabilize pt throughout distance. slow gait speed. Some R LE instability noted on today. VCs safety, sequence, distance from RW, step length.    Stairs             Wheelchair Mobility    Modified Rankin (Stroke Patients Only)       Balance                                            Cognition Arousal/Alertness: Awake/alert Behavior During Therapy: WFL for tasks assessed/performed Overall Cognitive Status: Within Functional Limits for tasks assessed                                        Exercises Total Joint Exercises Ankle Circles/Pumps: AROM;Both;Supine;10 reps Quad  Sets: AROM;Both;10 reps;Supine Heel Slides: AAROM;Right;Supine;10 reps Hip ABduction/ADduction: AAROM;Right;Supine;10 reps Long Arc Quad: AROM;10 reps;Seated;Right    General Comments        Pertinent Vitals/Pain Pain Assessment: 0-10 Pain Score: 5  Pain Location: R hip Pain Descriptors / Indicators: Aching;Sore Pain Intervention(s): Limited activity within patient's tolerance;Repositioned;Ice applied    Home Living                      Prior Function            PT Goals (current goals can now be found in the care plan section) Progress towards PT goals: Progressing toward goals    Frequency    7X/week      PT Plan Current plan remains appropriate    Co-evaluation              AM-PAC PT "6 Clicks" Daily Activity  Outcome Measure  Difficulty turning over in bed (including adjusting bedclothes, sheets and blankets)?: Unable Difficulty moving from lying on back to sitting on the side of the bed? : Unable Difficulty sitting down on and standing up from a chair with arms (e.g., wheelchair, bedside commode, etc,.)?: Unable Help needed moving to and from a bed to chair (including a  wheelchair)?: A Lot Help needed walking in hospital room?: A Lot Help needed climbing 3-5 steps with a railing? : A Lot 6 Click Score: 9    End of Session Equipment Utilized During Treatment: Gait belt Activity Tolerance: Patient limited by fatigue;Patient limited by pain Patient left: in chair;with call bell/phone within reach   PT Visit Diagnosis: Difficulty in walking, not elsewhere classified (R26.2)     Time: 6153-7943 PT Time Calculation (min) (ACUTE ONLY): 23 min  Charges:  $Gait Training: 8-22 mins $Therapeutic Exercise: 8-22 mins                    G Codes:          Rebeca Alert, MPT Pager: [phone]

## 2018-01-18 NOTE — Progress Notes (Signed)
Called to room by nurse tech. Patient had transferred from chair to bed and Dinamap was reading HR 20s-80s. I checked patient's radial pulse manually, it was 62 and irregular. Pt has history of Afib. Instructed tech to only take HR manually since the dinamap gives inaccurate readings d/t Afib.

## 2018-01-18 NOTE — Progress Notes (Signed)
Subjective: 2 Days Post-Op Procedure(s) (LRB): RIGHT TOTAL HIP ARTHROPLASTY ANTERIOR APPROACH (Right) Patient reports pain as mild.   Feeling better Objective: Vital signs in last 24 hours: Temp:  [98 F (36.7 C)-98.6 F (37 C)] 98 F (36.7 C) (07/14 0540) Pulse Rate:  [46-83] 83 (07/14 0540) Resp:  [16-18] 18 (07/14 0540) BP: (108-134)/(51-79) 108/79 (07/14 0540) SpO2:  [95 %-100 %] 99 % (07/14 0540)  Intake/Output from previous day: 07/13 0701 - 07/14 0700 In: 3100 [P.O.:2280; I.V.:820] Out: 1000 [Urine:1000] Intake/Output this shift: Total I/O In: 120 [P.O.:120] Out: -   Recent Labs    01/17/18 0505  HGB 11.4*   Recent Labs    01/17/18 0505  WBC 9.0  RBC 3.67*  HCT 35.8*  PLT 102*   Recent Labs    01/17/18 0505  NA 141  K 4.9  CL 104  CO2 31  BUN 20  CREATININE 0.96  GLUCOSE 169*  CALCIUM 8.6*   No results for input(s): LABPT, INR in the last 72 hours.  Neurologically intact No cellulitis present Compartment soft    Assessment/Plan: 2 Days Post-Op Procedure(s) (LRB): RIGHT TOTAL HIP ARTHROPLASTY ANTERIOR APPROACH (Right) Up with therapy Discharge to [facility] [place] tomorrow    Valeria Batman 01/18/2018, 10:34 AM

## 2018-01-19 ENCOUNTER — Inpatient Hospital Stay
Admission: RE | Admit: 2018-01-19 | Discharge: 2018-01-26 | Disposition: A | Discharge status: Skilled Nursing Facility | Payer: Medicare Other | Source: Ambulatory Visit | Attending: Internal Medicine | Admitting: Internal Medicine

## 2018-01-19 DIAGNOSIS — K3184 Gastroparesis: Secondary | ICD-10-CM | POA: Diagnosis not present

## 2018-01-19 DIAGNOSIS — I2510 Atherosclerotic heart disease of native coronary artery without angina pectoris: Secondary | ICD-10-CM | POA: Diagnosis not present

## 2018-01-19 DIAGNOSIS — I482 Chronic atrial fibrillation: Secondary | ICD-10-CM | POA: Diagnosis not present

## 2018-01-19 DIAGNOSIS — F329 Major depressive disorder, single episode, unspecified: Secondary | ICD-10-CM | POA: Diagnosis not present

## 2018-01-19 DIAGNOSIS — E118 Type 2 diabetes mellitus with unspecified complications: Secondary | ICD-10-CM | POA: Diagnosis not present

## 2018-01-19 DIAGNOSIS — M170 Bilateral primary osteoarthritis of knee: Secondary | ICD-10-CM | POA: Diagnosis not present

## 2018-01-19 DIAGNOSIS — E1143 Type 2 diabetes mellitus with diabetic autonomic (poly)neuropathy: Secondary | ICD-10-CM | POA: Diagnosis not present

## 2018-01-19 DIAGNOSIS — Z471 Aftercare following joint replacement surgery: Secondary | ICD-10-CM | POA: Diagnosis not present

## 2018-01-19 DIAGNOSIS — I5022 Chronic systolic (congestive) heart failure: Secondary | ICD-10-CM | POA: Diagnosis not present

## 2018-01-19 DIAGNOSIS — Z96641 Presence of right artificial hip joint: Secondary | ICD-10-CM | POA: Diagnosis not present

## 2018-01-19 DIAGNOSIS — Z7401 Bed confinement status: Secondary | ICD-10-CM | POA: Diagnosis not present

## 2018-01-19 DIAGNOSIS — F411 Generalized anxiety disorder: Secondary | ICD-10-CM | POA: Diagnosis not present

## 2018-01-19 DIAGNOSIS — M6281 Muscle weakness (generalized): Secondary | ICD-10-CM | POA: Diagnosis not present

## 2018-01-19 DIAGNOSIS — J449 Chronic obstructive pulmonary disease, unspecified: Secondary | ICD-10-CM | POA: Diagnosis not present

## 2018-01-19 DIAGNOSIS — M2550 Pain in unspecified joint: Secondary | ICD-10-CM | POA: Diagnosis not present

## 2018-01-19 DIAGNOSIS — I872 Venous insufficiency (chronic) (peripheral): Secondary | ICD-10-CM | POA: Diagnosis not present

## 2018-01-19 DIAGNOSIS — E785 Hyperlipidemia, unspecified: Secondary | ICD-10-CM | POA: Diagnosis not present

## 2018-01-19 DIAGNOSIS — Z794 Long term (current) use of insulin: Secondary | ICD-10-CM | POA: Diagnosis not present

## 2018-01-19 DIAGNOSIS — R262 Difficulty in walking, not elsewhere classified: Secondary | ICD-10-CM | POA: Diagnosis not present

## 2018-01-19 DIAGNOSIS — I10 Essential (primary) hypertension: Secondary | ICD-10-CM | POA: Diagnosis not present

## 2018-01-19 LAB — GLUCOSE, CAPILLARY
Glucose-Capillary: 165 mg/dL — ABNORMAL HIGH (ref 70–99)
Glucose-Capillary: 116 mg/dL — ABNORMAL HIGH (ref 70–99)

## 2018-01-19 MED ORDER — OXYCODONE HCL 5 MG PO TABS: 5.0000 mg | ORAL_TABLET | ORAL | 0 refills | Status: DC | PRN

## 2018-01-19 NOTE — Progress Notes (Signed)
Report called to [facility], spoke to [place].

## 2018-01-19 NOTE — Care Management Important Message (Signed)
Important Message  Patient Details  Name: Cody Hale MRN: [id number] Date of Birth: 27-Feb-1951   Medicare Important Message Given:  Yes    Caren Macadam 01/19/2018, 12:06 PMImportant Message  Patient Details  Name: Cody Hale MRN: [id number] Date of Birth: [birth year]   Medicare Important Message Given:  Yes    Caren Macadam 01/19/2018, 12:06 PM

## 2018-01-19 NOTE — Discharge Summary (Signed)
Patient ID: Cody Hale MRN: [id number] DOB/AGE: 1950-11-28 67 y.o.  Admit date: 01/16/2018 Discharge date: 01/19/2018  Admission Diagnoses:  Principal Problem:   Unilateral primary osteoarthritis, right hip Active Problems:   Status post total replacement of right hip   Discharge Diagnoses:  Same  Past Medical History:  Diagnosis Date  . A-fib (HCC)   . Anxiety   . Cellulitis   . CHF (congestive heart failure) (HCC)   . Chronic systolic heart failure (HCC) 11/2012  . Chronic venous insufficiency   . COPD (chronic obstructive pulmonary disease) (HCC)   . Coronary atherosclerosis of native coronary artery    Mild nonobstructive 08/2012  . Degenerative joint disease   . Diabetes mellitus, type II (HCC)    Gastroparesis; GI care at [facility]  . 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 s/p zyvox rx.  (Cultures never positive); left bundle branch block; H/o SVT; 09/2012: bioprosthetic MVR at [facility]; a. Severe dental caries and cavities s/p multiple extractions.  . Essential hypertension, benign   . 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 Left bundle branch block   . Left leg cellulitis   . Major depressive disorder, recurrent severe without psychotic features (HCC)    [facility] admission 12/2012  . Morbid obesity (HCC) 06/27/2012  . Nephrolithiasis   . PSVT (paroxysmal supraventricular tachycardia) (HCC)    Post-op at [facility]  . Sleep apnea    i use to use a device a long time ago but i dont anymore   . Urinary tract infection 08/30/2012   Proteus mirabilis    Surgeries: Procedure(s): RIGHT TOTAL HIP ARTHROPLASTY ANTERIOR APPROACH on 01/16/2018   Consultants:   Discharged Condition: Improved  Hospital Course: OLANDER FRIEDL is an 67 y.o. male who was admitted 01/16/2018 for operative treatment ofUnilateral primary osteoarthritis, right hip. Patient has severe unremitting pain that affects sleep, daily activities, and work/hobbies. After pre-op  clearance the patient was taken to the operating room on 01/16/2018 and underwent  Procedure(s): RIGHT TOTAL HIP ARTHROPLASTY ANTERIOR APPROACH.    Patient was given perioperative antibiotics:  Anti-infectives (From admission, onward)   Start     Dose/Rate Route Frequency Ordered Stop   01/16/18 1600  ceFAZolin (ANCEF) IVPB 2g/100 mL premix     2 g 200 mL/hr over 30 Minutes Intravenous Every 6 hours 01/16/18 1306 01/16/18 2244   01/16/18 0600  ceFAZolin (ANCEF) 3 g in dextrose 5 % 50 mL IVPB     3 g 100 mL/hr over 30 Minutes Intravenous On call to O.R. 01/15/18 1039 01/16/18 1017       Patient was given sequential compression devices, early ambulation, and chemoprophylaxis to prevent DVT.  Patient benefited maximally from hospital stay and there were no complications.    Recent vital signs:  Patient Vitals for the past 24 hrs:  BP Temp Temp src Pulse Resp SpO2  01/19/18 0515 (!) 134/55 98.8 F (37.1 C) Oral 74 16 97 %  01/18/18 1930 122/71 99 F (37.2 C) Oral 69 16 98 %  01/18/18 1359 (!) 134/45 97.7 F (36.5 C) Oral 62 14 97 %  01/18/18 1043 (!) 104/49 - - 78 - 98 %  01/18/18 0850 - - - - - 97 %  01/18/18 0840 - - - - - 99 %     Recent laboratory studies:  Recent Labs    01/17/18 0505  WBC 9.0  HGB 11.4*  HCT 35.8*  PLT 102*  NA 141  K 4.9  CL 104  CO2 31  BUN 20  CREATININE 0.96  GLUCOSE 169*  CALCIUM 8.6*     Discharge Medications:   Allergies as of 01/19/2018      Reactions   Daptomycin Rash   Lisinopril Cough   Tape Rash, Other (See Comments)   Adhesive Tape-Burn skin.      Medication List    TAKE these medications   carvedilol 6.25 MG tablet Commonly known as:  COREG TAKE 1 TABLET BY MOUTH TWICE DAILY.   colchicine 0.6 MG tablet Take 0.6 mg by mouth 2 (two) times daily.   DAILY VITE Tabs TAKE 1 TABLET BY MOUTH ONCE DAILY.   diphenoxylate-atropine 2.5-0.025 MG tablet Commonly known as:  LOMOTIL Take 1 tablet by mouth 4 (four) times daily  as needed for diarrhea or loose stools.   fluticasone 50 MCG/ACT nasal spray Commonly known as:  FLONASE Place 2 sprays into both nostrils daily.   guaiFENesin 100 MG/5ML liquid Commonly known as:  ROBITUSSIN Take 100 mg by mouth 3 (three) times daily as needed for cough.   insulin lispro 100 UNIT/ML KiwkPen Commonly known as:  HUMALOG Inject 0.05 mLs (5 Units total) into the skin 3 (three) times daily.   INVOKANA 100 MG Tabs tablet Generic drug:  canagliflozin TAKE ONE TABLET BY MOUTH ONCE DAILY BEFORE BREAKFAST.   LANTUS SOLOSTAR 100 UNIT/ML Solostar Pen Generic drug:  Insulin Glargine INJECT 70 UNITS SUBCUTANEOUSLY AT 10pm.   levocetirizine 5 MG tablet Commonly known as:  XYZAL Take 1 tablet (5 mg total) by mouth every evening.   lubiprostone 24 MCG capsule Commonly known as:  AMITIZA Take 1 capsule (24 mcg total) by mouth 2 (two) times daily with a meal. What changed:    when to take this  reasons to take this   metFORMIN 750 MG 24 hr tablet Commonly known as:  GLUCOPHAGE-XR TAKE 2 TABLETS BY MOUTH EACH MORNING WITH BREAKFAST.   nitroGLYCERIN 0.4 MG SL tablet Commonly known as:  NITROSTAT Place 1 tablet (0.4 mg total) under the tongue every 5 (five) minutes as needed for chest pain. Max 3 doses.   NOVOFINE AUTOCOVER 30G X 8 MM Misc Generic drug:  Insulin Pen Needle USE AS DIRECTED FOR INSULIN ADMINISTRATION.   NOVOLOG FLEXPEN 100 UNIT/ML FlexPen Generic drug:  insulin aspart Inject 5 Units into the skin 3 (three) times daily.   oxyCODONE 5 MG immediate release tablet Commonly known as:  Oxy IR/ROXICODONE Take 1-2 tablets (5-10 mg total) by mouth every 4 (four) hours as needed for moderate pain (pain score 4-6). What changed:    how much to take  when to take this  reasons to take this   potassium chloride SA 20 MEQ tablet Commonly known as:  K-DUR,KLOR-CON Take 1 tablet (20 mEq total) by mouth daily. On Monday & Friday, take an extra tablet (20 meq)  with your metolazone 2.5 mg What changed:    how much to take  when to take this  additional instructions   RESTORA Caps Take 1 capsule by mouth daily.   sacubitril-valsartan 24-26 MG Commonly known as:  ENTRESTO Take 1 tablet by mouth 2 (two) times daily.   simvastatin 20 MG tablet Commonly known as:  ZOCOR TAKE 1 TABLET BY MOUTH ONCE DAILY.   torsemide 20 MG tablet Commonly known as:  DEMADEX Take 1 tablet (20 mg total) by mouth 2 (two) times daily.   VASCULERA Tabs Take 1 capsule by mouth daily.  VIIBRYD 40 MG Tabs Generic drug:  Vilazodone HCl TAKE 1 TABLET BY MOUTH ONCE DAILY.   XARELTO 20 MG Tabs tablet Generic drug:  rivaroxaban TAKE 1 TABLET BY MOUTH ONCE DAILY.            Durable Medical Equipment  (From admission, onward)        Start     Ordered   01/16/18 1307  DME 3 n 1  Once     01/16/18 1306   01/16/18 1307  DME Walker rolling  Once    Question:  Patient needs a walker to treat with the following condition  Answer:  Status post total replacement of right hip   01/16/18 1306      Diagnostic Studies: Dg Pelvis Portable  Result Date: 01/16/2018 CLINICAL DATA:  Status post right total hip replacement. EXAM: PORTABLE PELVIS 1-2 VIEWS COMPARISON:  Radiograph of May 20, 2017. FINDINGS: The right femoral and acetabular components are well situated. No fracture or dislocation is noted. Expected postoperative changes are noted in the surrounding soft tissues. IMPRESSION: Status post right total hip arthroplasty. Electronically Signed   By: Lupita Raider, M.D.   On: 01/16/2018 12:24   Dg C-arm 1-60 Min-no Report  Result Date: 01/16/2018 Fluoroscopy was utilized by the requesting physician.  No radiographic interpretation.   Dg Hip Operative Unilat W Or W/o Pelvis Right  Result Date: 01/16/2018 CLINICAL DATA:  Postop right hip EXAM: OPERATIVE RIGHT HIP (WITH PELVIS IF PERFORMED) 10 VIEWS TECHNIQUE: Fluoroscopic spot image(s) were submitted  for interpretation post-operatively. FLUOROSCOPY TIME:  17 seconds COMPARISON:  Pelvic radiograph dated 05/20/2017 FINDINGS: Intraoperative fluoroscopic images during right hip arthroplasty. Surgical hardware is in satisfactory position. No fracture or complication is seen. IMPRESSION: Intraoperative fluoroscopic images during right hip arthroplasty, as above. Electronically Signed   By: Charline Bills M.D.   On: 01/16/2018 12:02    Disposition: Discharge disposition: 03-Skilled Nursing Facility       Discharge Instructions    Discharge patient   Complete by:  As directed    Discharge disposition:  03-Skilled Nursing Facility   Discharge patient date:  01/19/2018   Discharge patient   Complete by:  As directed    Discharge disposition:  03-Skilled Nursing Facility   Discharge patient date:  01/19/2018      Follow-up Information    Kathryne Hitch, MD Follow up in 2 week(s).   Specialty:  Orthopedic Surgery Contact information: [address] [place] [address] [phone]            Signed: Kathryne Hitch 01/19/2018, 7:27 AM

## 2018-01-19 NOTE — Progress Notes (Signed)
Patient ID: Cody Hale, male   DOB: 1951/01/12, 67 y.o.   MRN: [id number] Doing well overall.  Vitals stable.  Right hip stable.  Can be discharged to skilled nursing today.

## 2018-01-19 NOTE — Progress Notes (Signed)
Wasted 5 mg Oxycodone with Lina Sar, RN as my witness.  The pill was dropped on the floor.  Pt was already discharged at time of the waste.

## 2018-01-19 NOTE — Progress Notes (Signed)
Physical Therapy Treatment Patient Details Name: Cody Hale MRN: [id number] DOB: 1950/12/30 Today's Date: 01/19/2018    History of Present Illness Pt s/p R THRand with hx of DM, COPD, A-fib, MDD, and mitral valve replacement    PT Comments    POD #3 am session Assisted OOB to amb a limited distance while monitoring vitals esp RA.  SATURATION QUALIFICATIONS: (This note is used to comply with regulatory documentation for home oxygen)  Patient Saturations on Room Air at Rest = 96%  Patient Saturations on Room Air while Ambulating = 90%  Patient Saturations on        Liters of oxygen while Ambulating 55 feet       Please briefly explain why patient needs home oxygen:  Pt does NOT require supplemental oxygen with activity  Pt plans to D/C to SNF for ST Rehab via PTAR   Follow Up Recommendations  Follow surgeon's recommendation for DC plan and follow-up therapies;SNF     Equipment Recommendations       Recommendations for Other Services       Precautions / Restrictions Precautions Precautions: Fall Precaution Comments: COPD Restrictions Weight Bearing Restrictions: No RLE Weight Bearing: Weight bearing as tolerated    Mobility  Bed Mobility Overal bed mobility: Needs Assistance Bed Mobility: Supine to Sit     Supine to sit: Total assist;Max assist     General bed mobility comments: great difficulty self performing supine to sit due to ABD girth/BMI and required assist to coomplete scooting to EOB using bed pad   Transfers Overall transfer level: Needs assistance Equipment used: Rolling walker (2 wheeled) Transfers: Sit to/from Stand Sit to Stand: Mod assist         General transfer comment: from elevated bed using forward lean pt able to rise   Ambulation/Gait Ambulation/Gait assistance: Min assist Gait Distance (Feet): 55 Feet Assistive device: Rolling walker (2 wheeled) Gait Pattern/deviations: Step-to pattern;Decreased stance time -  right;Decreased step length - right Gait velocity: decr   General Gait Details: assisted with amb a limited distance while monitoring RA sats ranged from 90 - 92% with mild dyspnea on exersion.     Stairs             Wheelchair Mobility    Modified Rankin (Stroke Patients Only)       Balance                                            Cognition Arousal/Alertness: Awake/alert Behavior During Therapy: WFL for tasks assessed/performed Overall Cognitive Status: Within Functional Limits for tasks assessed                                        Exercises      General Comments        Pertinent Vitals/Pain Pain Assessment: No/denies pain Pain Score: 4  Pain Location: R hip Pain Descriptors / Indicators: Aching;Sore;Grimacing Pain Intervention(s): Monitored during session;Repositioned;Ice applied    Home Living                      Prior Function            PT Goals (current goals can now be found in the care plan section) Progress towards PT goals: Progressing  toward goals    Frequency    7X/week      PT Plan Current plan remains appropriate    Co-evaluation              AM-PAC PT "6 Clicks" Daily Activity  Outcome Measure  Difficulty turning over in bed (including adjusting bedclothes, sheets and blankets)?: A Lot Difficulty moving from lying on back to sitting on the side of the bed? : A Lot Difficulty sitting down on and standing up from a chair with arms (e.g., wheelchair, bedside commode, etc,.)?: A Lot Help needed moving to and from a bed to chair (including a wheelchair)?: A Lot   Help needed climbing 3-5 steps with a railing? : Total 6 Click Score: 9    End of Session Equipment Utilized During Treatment: Gait belt Activity Tolerance: Patient limited by fatigue Patient left: in chair;with call bell/phone within reach Nurse Communication: Mobility status(pt does NOT require supplemental  oxygen with activity ) PT Visit Diagnosis: Difficulty in walking, not elsewhere classified (R26.2)     Time: 2595-6387 PT Time Calculation (min) (ACUTE ONLY): 30 min  Charges:  $Gait Training: 8-22 mins $Therapeutic Activity: 8-22 mins                    G Codes:       Felecia Shelling  PTA [facility]  Acute  Rehab Pager      [phone]

## 2018-01-19 NOTE — Progress Notes (Signed)
Pt discharging today to admit to [facility]- report # [phone]. Provided DC information via the HUB. Pt reports he will inform family- CSW offered to call to answer their questions if needed. Will arrange PTAR transportation.  Ilean Skill, MSW, LCSW Clinical Social Work 01/19/2018 [phone] coverage for [phone]

## 2018-01-20 ENCOUNTER — Encounter: Payer: Self-pay | Admitting: Internal Medicine

## 2018-01-20 ENCOUNTER — Non-Acute Institutional Stay (SKILLED_NURSING_FACILITY): Payer: Medicare Other | Admitting: Internal Medicine

## 2018-01-20 ENCOUNTER — Telehealth: Payer: Self-pay | Admitting: *Deleted

## 2018-01-20 DIAGNOSIS — I10 Essential (primary) hypertension: Secondary | ICD-10-CM

## 2018-01-20 DIAGNOSIS — E785 Hyperlipidemia, unspecified: Secondary | ICD-10-CM | POA: Diagnosis not present

## 2018-01-20 DIAGNOSIS — Z794 Long term (current) use of insulin: Secondary | ICD-10-CM

## 2018-01-20 DIAGNOSIS — E118 Type 2 diabetes mellitus with unspecified complications: Secondary | ICD-10-CM

## 2018-01-20 DIAGNOSIS — Z96641 Presence of right artificial hip joint: Secondary | ICD-10-CM

## 2018-01-20 DIAGNOSIS — I482 Chronic atrial fibrillation, unspecified: Secondary | ICD-10-CM

## 2018-01-20 DIAGNOSIS — I872 Venous insufficiency (chronic) (peripheral): Secondary | ICD-10-CM | POA: Diagnosis not present

## 2018-01-20 DIAGNOSIS — I5022 Chronic systolic (congestive) heart failure: Secondary | ICD-10-CM

## 2018-01-20 NOTE — Telephone Encounter (Signed)
Pt was on TCM report admitted 01/16/18 for Unilateral primary osteoarthritis, right hip. Pt had RIGHT TOTAL HIP ARTHROPLASTY ANTERIOR APPROACH, and D/C 01/19/18 to SNF. Pt will f/u w/surgeon when discharge. Doesn't need TCM appt w/ PCP.Marland KitchenRaechel Chute
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
## 2018-01-22 ENCOUNTER — Encounter: Payer: Self-pay | Admitting: Internal Medicine

## 2018-01-22 ENCOUNTER — Other Ambulatory Visit: Payer: Self-pay

## 2018-01-22 ENCOUNTER — Non-Acute Institutional Stay (SKILLED_NURSING_FACILITY): Payer: Medicare Other | Admitting: Internal Medicine

## 2018-01-22 ENCOUNTER — Telehealth: Payer: Self-pay

## 2018-01-22 DIAGNOSIS — E1143 Type 2 diabetes mellitus with diabetic autonomic (poly)neuropathy: Secondary | ICD-10-CM

## 2018-01-22 DIAGNOSIS — Z794 Long term (current) use of insulin: Secondary | ICD-10-CM

## 2018-01-22 DIAGNOSIS — I482 Chronic atrial fibrillation, unspecified: Secondary | ICD-10-CM

## 2018-01-22 DIAGNOSIS — I5022 Chronic systolic (congestive) heart failure: Secondary | ICD-10-CM | POA: Diagnosis not present

## 2018-01-22 DIAGNOSIS — E118 Type 2 diabetes mellitus with unspecified complications: Secondary | ICD-10-CM

## 2018-01-22 DIAGNOSIS — K3184 Gastroparesis: Secondary | ICD-10-CM

## 2018-01-22 MED ORDER — OXYCODONE HCL 5 MG PO TABS: 5.0000 mg | ORAL_TABLET | ORAL | 0 refills | Status: DC | PRN
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
## 2018-01-22 NOTE — Telephone Encounter (Signed)
Key: [id number]

## 2018-01-22 NOTE — Telephone Encounter (Signed)
RX Fax for [facility]@ [phone]  

## 2018-01-23 NOTE — Telephone Encounter (Signed)
PA for vasculera was denied.

## 2018-01-27 ENCOUNTER — Encounter (HOSPITAL_COMMUNITY)
Admission: RE | Admit: 2018-01-27 | Discharge: 2018-01-27 | Disposition: A | Discharge status: Home / Self Care | Payer: Medicare Other | Source: Skilled Nursing Facility | Attending: Internal Medicine | Admitting: Internal Medicine

## 2018-01-27 ENCOUNTER — Telehealth: Payer: Self-pay | Admitting: Internal Medicine

## 2018-01-27 DIAGNOSIS — K5903 Drug induced constipation: Secondary | ICD-10-CM | POA: Insufficient documentation

## 2018-01-27 DIAGNOSIS — Z96641 Presence of right artificial hip joint: Secondary | ICD-10-CM | POA: Insufficient documentation

## 2018-01-27 DIAGNOSIS — Z952 Presence of prosthetic heart valve: Secondary | ICD-10-CM | POA: Insufficient documentation

## 2018-01-27 DIAGNOSIS — M170 Bilateral primary osteoarthritis of knee: Secondary | ICD-10-CM | POA: Insufficient documentation

## 2018-01-27 DIAGNOSIS — R2689 Other abnormalities of gait and mobility: Secondary | ICD-10-CM | POA: Diagnosis not present

## 2018-01-27 NOTE — Telephone Encounter (Signed)
Copied from CRM [id number]. Topic: General - Other >> Jan 27, 2018  2:05 PM Maia Petties wrote: Reason for CRM: Lupita Leash states pt was at [facility] and is now back at [facility]. She needs clarification on orders she has received.

## 2018-01-27 NOTE — Telephone Encounter (Unsigned)
Copied from CRM [id number]. Topic: General - Other >> Jan 27, 2018 11:32 AM Gaynelle Adu wrote: Reason for CRM: Victorino Dike from [facility] called to request alternative medication for [facility]) [facility]  they do not offer this medication and is needing something different to be called in.  Please advise. CB#[phone] Fax# [phone]

## 2018-01-27 NOTE — Telephone Encounter (Signed)
Is there an alternative to [facility]?

## 2018-01-27 NOTE — Telephone Encounter (Signed)
They will be faxing over the orders.

## 2018-01-27 NOTE — Telephone Encounter (Signed)
Can you call them and have them send the orders that they have received, as we have not sent any new orders?

## 2018-01-29 DIAGNOSIS — R2689 Other abnormalities of gait and mobility: Secondary | ICD-10-CM | POA: Diagnosis not present

## 2018-01-29 DIAGNOSIS — Z96641 Presence of right artificial hip joint: Secondary | ICD-10-CM | POA: Diagnosis not present

## 2018-01-30 DIAGNOSIS — R2689 Other abnormalities of gait and mobility: Secondary | ICD-10-CM | POA: Diagnosis not present

## 2018-01-30 DIAGNOSIS — Z96641 Presence of right artificial hip joint: Secondary | ICD-10-CM | POA: Diagnosis not present

## 2018-02-02 ENCOUNTER — Telehealth: Payer: Self-pay | Admitting: Internal Medicine

## 2018-02-02 ENCOUNTER — Encounter (INDEPENDENT_AMBULATORY_CARE_PROVIDER_SITE_OTHER): Payer: Self-pay | Admitting: Orthopaedic Surgery

## 2018-02-02 ENCOUNTER — Ambulatory Visit (INDEPENDENT_AMBULATORY_CARE_PROVIDER_SITE_OTHER): Payer: Medicare Other | Admitting: Orthopaedic Surgery

## 2018-02-02 DIAGNOSIS — Z96641 Presence of right artificial hip joint: Secondary | ICD-10-CM | POA: Diagnosis not present

## 2018-02-02 DIAGNOSIS — R2689 Other abnormalities of gait and mobility: Secondary | ICD-10-CM | POA: Diagnosis not present

## 2018-02-02 NOTE — Telephone Encounter (Signed)
risa-bid (can not get this med).  Can get bacid for patient.  Would like approval to replace.  States received this script with an FL2.   States has faxed last week and has called the office twice but I do not see any notes.

## 2018-02-02 NOTE — Progress Notes (Signed)
Cody Hale is 2 weeks status post a right total hip arthroplasty.  He is ambulating with a rolling walker and doing well overall.  He is on chronic Xarelto as a blood thinning medication.  On exam his incision looks great.  There is area that is slightly inflamed at the very top of the incision at his groin crease.  We removed all the staples.  Steri-Strips were applied.  This is an area that needs to have Bactroban ointment placed daily after he showers with a dry dressing.  His leg lengths appear equal.  He is walking with really good balance and coordination.  He tolerates range of motion of the right hip.  At this point he needs just 5 outpatient physical therapy visits to work on his balance and coordination.  I filled out paperwork for him that reflects this.  We will see him back in the office in 4 weeks to see how is doing overall and to assess his incision unless there is issues prior to then we need to see him earlier.

## 2018-02-02 NOTE — Telephone Encounter (Signed)
I did not see an order for glucometer or diet order. Can you have them resend please?

## 2018-02-02 NOTE — Telephone Encounter (Signed)
Copied from CRM [id number]. Topic: General - Other >> Feb 02, 2018  1:22 PM Stephannie Li, NT wrote: Reason for CRM: [facility] called to follow up on  a request faxed week  last for order for a diet order ,and a glucose monitor  order please fax to  [phone]  or call [phone] Lupita Leash ,

## 2018-02-03 ENCOUNTER — Telehealth (INDEPENDENT_AMBULATORY_CARE_PROVIDER_SITE_OTHER): Payer: Self-pay | Admitting: Orthopaedic Surgery

## 2018-02-03 ENCOUNTER — Telehealth: Payer: Self-pay | Admitting: Internal Medicine

## 2018-02-03 ENCOUNTER — Other Ambulatory Visit (INDEPENDENT_AMBULATORY_CARE_PROVIDER_SITE_OTHER): Payer: Self-pay

## 2018-02-03 MED ORDER — METHOCARBAMOL 500 MG PO TABS: 500.0000 mg | ORAL_TABLET | Freq: Four times a day (QID) | ORAL | 1 refills | Status: DC | PRN

## 2018-02-03 NOTE — Telephone Encounter (Signed)
Can we call him in some muscle relaxer please

## 2018-02-03 NOTE — Telephone Encounter (Signed)
Ann with [facility] called advised the insurance company will not pay for the Rx Robaxin but will cover Tivanidine. The number to contact Dewayne Hatch is [phone]

## 2018-02-03 NOTE — Telephone Encounter (Signed)
Copied from CRM [id number]. Topic: Quick Communication - See Telephone Encounter >> Feb 03, 2018  1:16 PM Windy Kalata, NT wrote: CRM for notification. See Telephone encounter for: 02/03/18.  Lupita Leash is calling from [facility] longterm [facility] and states she faxed over 2 forms and only received the cover letter back she did not receive the 2 forms. Please advise. Fax# [phone] and Cb# [phone]

## 2018-02-03 NOTE — Telephone Encounter (Signed)
This has been refaxed. Confirmed with Lupita Leash that this has been received.

## 2018-02-03 NOTE — Telephone Encounter (Signed)
Send in some Robaxin please.  %00 mg, 1-2 every 6 hours as needed for spasms/pain, #60. Thanks

## 2018-02-03 NOTE — Telephone Encounter (Signed)
Cody Hale from [facility] called stating that the patient was upset and highly agitated that Dr. Magnus Ivan did not call in the muscle relaxer that was discussed during his appointment yesterday.  [name][facility] CB# [phone].  Thank you.

## 2018-02-03 NOTE — Telephone Encounter (Signed)
Rx sent in for patient

## 2018-02-03 NOTE — Telephone Encounter (Signed)
Cody Hale is refaxing.

## 2018-02-03 NOTE — Telephone Encounter (Signed)
Patient aware this was called in  

## 2018-02-04 ENCOUNTER — Telehealth (INDEPENDENT_AMBULATORY_CARE_PROVIDER_SITE_OTHER): Payer: Self-pay | Admitting: Orthopaedic Surgery

## 2018-02-04 ENCOUNTER — Other Ambulatory Visit (INDEPENDENT_AMBULATORY_CARE_PROVIDER_SITE_OTHER): Payer: Self-pay

## 2018-02-04 DIAGNOSIS — Z96641 Presence of right artificial hip joint: Secondary | ICD-10-CM | POA: Diagnosis not present

## 2018-02-04 DIAGNOSIS — R2689 Other abnormalities of gait and mobility: Secondary | ICD-10-CM | POA: Diagnosis not present

## 2018-02-04 MED ORDER — TIZANIDINE HCL 4 MG PO TABS: 4.0000 mg | ORAL_TABLET | Freq: Three times a day (TID) | ORAL | 0 refills | Status: DC | PRN

## 2018-02-04 NOTE — Telephone Encounter (Signed)
Faxed D/C note to facility [phone]

## 2018-02-04 NOTE — Telephone Encounter (Signed)
Called in zanaflex instead

## 2018-02-04 NOTE — Telephone Encounter (Signed)
Cody Hale, from [facility] left a message this morning stating that she received an order this morning for Zanaflex.  The long term facility requires a discontinue order for the Robaxin.  [name][facility] CB#[phone].  Thank you.

## 2018-02-06 DIAGNOSIS — Z96641 Presence of right artificial hip joint: Secondary | ICD-10-CM | POA: Diagnosis not present

## 2018-02-06 DIAGNOSIS — R2689 Other abnormalities of gait and mobility: Secondary | ICD-10-CM | POA: Diagnosis not present

## 2018-02-09 ENCOUNTER — Other Ambulatory Visit: Payer: Self-pay | Admitting: Orthopaedic Surgery

## 2018-02-09 DIAGNOSIS — R2689 Other abnormalities of gait and mobility: Secondary | ICD-10-CM | POA: Diagnosis not present

## 2018-02-09 DIAGNOSIS — Z96641 Presence of right artificial hip joint: Secondary | ICD-10-CM | POA: Diagnosis not present

## 2018-02-11 DIAGNOSIS — Z96641 Presence of right artificial hip joint: Secondary | ICD-10-CM | POA: Diagnosis not present

## 2018-02-11 DIAGNOSIS — R2689 Other abnormalities of gait and mobility: Secondary | ICD-10-CM | POA: Diagnosis not present

## 2018-02-12 DIAGNOSIS — Z96641 Presence of right artificial hip joint: Secondary | ICD-10-CM | POA: Diagnosis not present

## 2018-02-12 DIAGNOSIS — R2689 Other abnormalities of gait and mobility: Secondary | ICD-10-CM | POA: Diagnosis not present

## 2018-02-17 DIAGNOSIS — Z96641 Presence of right artificial hip joint: Secondary | ICD-10-CM | POA: Diagnosis not present

## 2018-02-17 DIAGNOSIS — R2689 Other abnormalities of gait and mobility: Secondary | ICD-10-CM | POA: Diagnosis not present

## 2018-02-19 ENCOUNTER — Other Ambulatory Visit (INDEPENDENT_AMBULATORY_CARE_PROVIDER_SITE_OTHER): Payer: Self-pay | Admitting: Orthopaedic Surgery

## 2018-02-19 ENCOUNTER — Other Ambulatory Visit: Payer: Self-pay | Admitting: Internal Medicine

## 2018-02-20 DIAGNOSIS — Z96641 Presence of right artificial hip joint: Secondary | ICD-10-CM | POA: Diagnosis not present

## 2018-02-20 DIAGNOSIS — R2689 Other abnormalities of gait and mobility: Secondary | ICD-10-CM | POA: Diagnosis not present

## 2018-02-21 DIAGNOSIS — R2689 Other abnormalities of gait and mobility: Secondary | ICD-10-CM | POA: Diagnosis not present

## 2018-02-21 DIAGNOSIS — Z96641 Presence of right artificial hip joint: Secondary | ICD-10-CM | POA: Diagnosis not present

## 2018-02-24 ENCOUNTER — Other Ambulatory Visit: Payer: Self-pay

## 2018-02-24 ENCOUNTER — Emergency Department (HOSPITAL_COMMUNITY)
Admission: EM | Admit: 2018-02-24 | Discharge: 2018-02-24 | Disposition: A | Discharge status: Home / Self Care | Payer: Medicare Other | Attending: Emergency Medicine | Admitting: Emergency Medicine

## 2018-02-24 ENCOUNTER — Ambulatory Visit: Payer: Self-pay | Admitting: Cardiovascular Disease

## 2018-02-24 ENCOUNTER — Emergency Department (HOSPITAL_COMMUNITY): Payer: Medicare Other

## 2018-02-24 ENCOUNTER — Encounter (HOSPITAL_COMMUNITY): Payer: Self-pay

## 2018-02-24 DIAGNOSIS — E119 Type 2 diabetes mellitus without complications: Secondary | ICD-10-CM | POA: Insufficient documentation

## 2018-02-24 DIAGNOSIS — E860 Dehydration: Secondary | ICD-10-CM | POA: Insufficient documentation

## 2018-02-24 DIAGNOSIS — J449 Chronic obstructive pulmonary disease, unspecified: Secondary | ICD-10-CM | POA: Insufficient documentation

## 2018-02-24 DIAGNOSIS — I110 Hypertensive heart disease with heart failure: Secondary | ICD-10-CM | POA: Insufficient documentation

## 2018-02-24 DIAGNOSIS — Z96641 Presence of right artificial hip joint: Secondary | ICD-10-CM | POA: Diagnosis not present

## 2018-02-24 DIAGNOSIS — I5022 Chronic systolic (congestive) heart failure: Secondary | ICD-10-CM | POA: Insufficient documentation

## 2018-02-24 DIAGNOSIS — Z87891 Personal history of nicotine dependence: Secondary | ICD-10-CM | POA: Diagnosis not present

## 2018-02-24 DIAGNOSIS — R2689 Other abnormalities of gait and mobility: Secondary | ICD-10-CM | POA: Diagnosis not present

## 2018-02-24 DIAGNOSIS — I959 Hypotension, unspecified: Secondary | ICD-10-CM | POA: Insufficient documentation

## 2018-02-24 DIAGNOSIS — I2510 Atherosclerotic heart disease of native coronary artery without angina pectoris: Secondary | ICD-10-CM | POA: Diagnosis not present

## 2018-02-24 DIAGNOSIS — R42 Dizziness and giddiness: Secondary | ICD-10-CM | POA: Diagnosis not present

## 2018-02-24 HISTORY — DX: Unspecified atrial fibrillation: I48.91

## 2018-02-24 LAB — URINALYSIS, ROUTINE W REFLEX MICROSCOPIC
Bacteria, UA: NONE SEEN
Bilirubin Urine: NEGATIVE
Glucose, UA: >=500 mg/dL — AB
Hgb urine dipstick: NEGATIVE
Ketones, ur: NEGATIVE mg/dL
Nitrite: NEGATIVE
Protein, ur: NEGATIVE mg/dL
Specific Gravity, Urine: 1.008 (ref 1.005–1.030)
pH: 6 (ref 5.0–8.0)

## 2018-02-24 LAB — BRAIN NATRIURETIC PEPTIDE: B Natriuretic Peptide: 219 pg/mL — ABNORMAL HIGH (ref 0.0–100.0)

## 2018-02-24 LAB — CBC
HCT: 41.6 % (ref 39.0–52.0)
Hemoglobin: 12.9 g/dL — ABNORMAL LOW (ref 13.0–17.0)
MCH: 31 pg (ref 26.0–34.0)
MCHC: 31 g/dL (ref 30.0–36.0)
MCV: 100 fL (ref 78.0–100.0)
Platelets: 164 10*3/uL (ref 150–400)
RBC: 4.16 MIL/uL — ABNORMAL LOW (ref 4.22–5.81)
RDW: 15.1 % (ref 11.5–15.5)
WBC: 3.8 10*3/uL — ABNORMAL LOW (ref 4.0–10.5)

## 2018-02-24 LAB — COMPREHENSIVE METABOLIC PANEL
ALT: 27 U/L (ref 0–44)
AST: 31 U/L (ref 15–41)
Albumin: 3.6 g/dL (ref 3.5–5.0)
Alkaline Phosphatase: 92 U/L (ref 38–126)
Anion gap: 7 (ref 5–15)
BUN: 15 mg/dL (ref 8–23)
CO2: 32 mmol/L (ref 22–32)
Calcium: 9 mg/dL (ref 8.9–10.3)
Chloride: 103 mmol/L (ref 98–111)
Creatinine, Ser: 1.16 mg/dL (ref 0.61–1.24)
GFR calc Af Amer: >60 mL/min (ref >60)
GFR calc non Af Amer: >60 mL/min (ref >60)
Glucose, Bld: 113 mg/dL — ABNORMAL HIGH (ref 70–99)
Potassium: 4.4 mmol/L (ref 3.5–5.1)
Sodium: 142 mmol/L (ref 135–145)
Total Bilirubin: 0.8 mg/dL (ref 0.3–1.2)
Total Protein: 6.7 g/dL (ref 6.5–8.1)

## 2018-02-24 LAB — TROPONIN I: Troponin I: <0.03 ng/mL (ref <0.03)

## 2018-02-24 NOTE — ED Triage Notes (Signed)
Pt resident of [facility].  REports woke up feeling normal.  Walked to dining room and ate breakfast but started having blurred vision and generalized weakness around 030 and was found to be hypotensive.  EMS reports initial bp 85/45.  EMS gave IV bolus NS, bp 66/38 pta.  PT alert and oriented.

## 2018-02-24 NOTE — Discharge Instructions (Addendum)
You were evaluated in the Emergency Department and after careful evaluation, we did not find any emergent condition requiring admission or further testing in the hospital.  Your symptoms today seem to be due to dehydration in the setting of recent diarrhea.  The timing and dosage of your morning medications might also be contributing to your symptoms.  Please call your regular prescribing doctor as soon as possible to discuss your symptoms and your medication dosages.  Please return to the Emergency Department if you experience any return of symptoms.  We encourage you to follow up with a primary care provider.  Thank you for allowing [place] to be a part of your care.
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
## 2018-02-25 DIAGNOSIS — Z96641 Presence of right artificial hip joint: Secondary | ICD-10-CM | POA: Diagnosis not present

## 2018-02-25 DIAGNOSIS — R2689 Other abnormalities of gait and mobility: Secondary | ICD-10-CM | POA: Diagnosis not present

## 2018-02-26 ENCOUNTER — Other Ambulatory Visit: Payer: Self-pay | Admitting: Internal Medicine

## 2018-02-26 ENCOUNTER — Telehealth: Payer: Self-pay | Admitting: Internal Medicine

## 2018-02-26 DIAGNOSIS — Z96641 Presence of right artificial hip joint: Secondary | ICD-10-CM | POA: Diagnosis not present

## 2018-02-26 DIAGNOSIS — R2689 Other abnormalities of gait and mobility: Secondary | ICD-10-CM | POA: Diagnosis not present

## 2018-02-26 NOTE — Telephone Encounter (Signed)
Check [place] registry last filled 01/26/2018../lmb  

## 2018-02-26 NOTE — Telephone Encounter (Signed)
Copied from CRM [id number]. Topic: Quick Communication - Rx Refill/Question >> Feb 26, 2018 10:49 AM Darletta Moll L wrote: Medication: Oxycodone (for hip and leg pain, [facility] Leash [facility] called on behalf of patient who just had hip surgery)  Has the patient contacted their pharmacy? Yes.   (Agent: If no, request that the patient contact the pharmacy for the refill.) (Agent: If yes, when and what did the pharmacy advise?)  Preferred Pharmacy (with phone number or street name): [facility] [place], [place] - [address] [place] [address] Phone: [phone] Fax: [phone]   Agent: Please be advised that RX refills may take up to 3 business days. We ask that you follow-up with your pharmacy.  Say's the pharmacy faxed this request already but not sure when.

## 2018-03-02 ENCOUNTER — Ambulatory Visit (INDEPENDENT_AMBULATORY_CARE_PROVIDER_SITE_OTHER): Payer: Medicare Other | Admitting: Orthopaedic Surgery

## 2018-03-02 ENCOUNTER — Encounter (HOSPITAL_COMMUNITY): Payer: Self-pay | Admitting: Emergency Medicine

## 2018-03-02 ENCOUNTER — Inpatient Hospital Stay (HOSPITAL_COMMUNITY)
Admission: EM | Admit: 2018-03-02 | Discharge: 2018-03-08 | DRG: 208 | Disposition: A | Discharge status: Skilled Nursing Facility | Payer: Medicare Other | Attending: Internal Medicine | Admitting: Internal Medicine

## 2018-03-02 ENCOUNTER — Encounter (INDEPENDENT_AMBULATORY_CARE_PROVIDER_SITE_OTHER): Payer: Self-pay | Admitting: Orthopaedic Surgery

## 2018-03-02 ENCOUNTER — Emergency Department (HOSPITAL_COMMUNITY): Payer: Medicare Other

## 2018-03-02 ENCOUNTER — Other Ambulatory Visit: Payer: Self-pay

## 2018-03-02 DIAGNOSIS — Z96641 Presence of right artificial hip joint: Secondary | ICD-10-CM

## 2018-03-02 DIAGNOSIS — R402364 Coma scale, best motor response, obeys commands, 24 hours or more after hospital admission: Secondary | ICD-10-CM | POA: Diagnosis not present

## 2018-03-02 DIAGNOSIS — J9601 Acute respiratory failure with hypoxia: Secondary | ICD-10-CM | POA: Diagnosis not present

## 2018-03-02 DIAGNOSIS — I872 Venous insufficiency (chronic) (peripheral): Secondary | ICD-10-CM | POA: Diagnosis present

## 2018-03-02 DIAGNOSIS — Z87891 Personal history of nicotine dependence: Secondary | ICD-10-CM

## 2018-03-02 DIAGNOSIS — R402134 Coma scale, eyes open, to sound, 24 hours or more after hospital admission: Secondary | ICD-10-CM | POA: Diagnosis not present

## 2018-03-02 DIAGNOSIS — Z953 Presence of xenogenic heart valve: Secondary | ICD-10-CM

## 2018-03-02 DIAGNOSIS — R2689 Other abnormalities of gait and mobility: Secondary | ICD-10-CM | POA: Diagnosis not present

## 2018-03-02 DIAGNOSIS — I482 Chronic atrial fibrillation, unspecified: Secondary | ICD-10-CM | POA: Diagnosis present

## 2018-03-02 DIAGNOSIS — Z0189 Encounter for other specified special examinations: Secondary | ICD-10-CM

## 2018-03-02 DIAGNOSIS — Z789 Other specified health status: Secondary | ICD-10-CM

## 2018-03-02 DIAGNOSIS — Y92230 Patient room in hospital as the place of occurrence of the external cause: Secondary | ICD-10-CM | POA: Diagnosis not present

## 2018-03-02 DIAGNOSIS — Z8679 Personal history of other diseases of the circulatory system: Secondary | ICD-10-CM

## 2018-03-02 DIAGNOSIS — I499 Cardiac arrhythmia, unspecified: Secondary | ICD-10-CM | POA: Diagnosis not present

## 2018-03-02 DIAGNOSIS — I447 Left bundle-branch block, unspecified: Secondary | ICD-10-CM | POA: Diagnosis present

## 2018-03-02 DIAGNOSIS — J441 Chronic obstructive pulmonary disease with (acute) exacerbation: Secondary | ICD-10-CM | POA: Diagnosis present

## 2018-03-02 DIAGNOSIS — I509 Heart failure, unspecified: Secondary | ICD-10-CM | POA: Diagnosis not present

## 2018-03-02 DIAGNOSIS — E872 Acidosis: Secondary | ICD-10-CM | POA: Diagnosis not present

## 2018-03-02 DIAGNOSIS — Z881 Allergy status to other antibiotic agents status: Secondary | ICD-10-CM

## 2018-03-02 DIAGNOSIS — I4891 Unspecified atrial fibrillation: Secondary | ICD-10-CM | POA: Diagnosis not present

## 2018-03-02 DIAGNOSIS — E118 Type 2 diabetes mellitus with unspecified complications: Secondary | ICD-10-CM | POA: Diagnosis present

## 2018-03-02 DIAGNOSIS — I110 Hypertensive heart disease with heart failure: Secondary | ICD-10-CM | POA: Diagnosis present

## 2018-03-02 DIAGNOSIS — R402254 Coma scale, best verbal response, oriented, 24 hours or more after hospital admission: Secondary | ICD-10-CM | POA: Diagnosis not present

## 2018-03-02 DIAGNOSIS — J9600 Acute respiratory failure, unspecified whether with hypoxia or hypercapnia: Secondary | ICD-10-CM | POA: Diagnosis present

## 2018-03-02 DIAGNOSIS — T17990A Other foreign object in respiratory tract, part unspecified in causing asphyxiation, initial encounter: Secondary | ICD-10-CM | POA: Diagnosis present

## 2018-03-02 DIAGNOSIS — R0603 Acute respiratory distress: Secondary | ICD-10-CM

## 2018-03-02 DIAGNOSIS — I472 Ventricular tachycardia: Secondary | ICD-10-CM | POA: Diagnosis present

## 2018-03-02 DIAGNOSIS — Z79899 Other long term (current) drug therapy: Secondary | ICD-10-CM

## 2018-03-02 DIAGNOSIS — J9602 Acute respiratory failure with hypercapnia: Secondary | ICD-10-CM | POA: Diagnosis not present

## 2018-03-02 DIAGNOSIS — R0602 Shortness of breath: Secondary | ICD-10-CM | POA: Diagnosis not present

## 2018-03-02 DIAGNOSIS — K3184 Gastroparesis: Secondary | ICD-10-CM

## 2018-03-02 DIAGNOSIS — N179 Acute kidney failure, unspecified: Secondary | ICD-10-CM | POA: Diagnosis not present

## 2018-03-02 DIAGNOSIS — T380X5A Adverse effect of glucocorticoids and synthetic analogues, initial encounter: Secondary | ICD-10-CM | POA: Diagnosis not present

## 2018-03-02 DIAGNOSIS — F332 Major depressive disorder, recurrent severe without psychotic features: Secondary | ICD-10-CM | POA: Diagnosis present

## 2018-03-02 DIAGNOSIS — Z888 Allergy status to other drugs, medicaments and biological substances status: Secondary | ICD-10-CM

## 2018-03-02 DIAGNOSIS — I5023 Acute on chronic systolic (congestive) heart failure: Secondary | ICD-10-CM | POA: Diagnosis not present

## 2018-03-02 DIAGNOSIS — F419 Anxiety disorder, unspecified: Secondary | ICD-10-CM | POA: Diagnosis present

## 2018-03-02 DIAGNOSIS — G4730 Sleep apnea, unspecified: Secondary | ICD-10-CM | POA: Diagnosis present

## 2018-03-02 DIAGNOSIS — J690 Pneumonitis due to inhalation of food and vomit: Principal | ICD-10-CM | POA: Diagnosis present

## 2018-03-02 DIAGNOSIS — Z4659 Encounter for fitting and adjustment of other gastrointestinal appliance and device: Secondary | ICD-10-CM

## 2018-03-02 DIAGNOSIS — Z6838 Body mass index (BMI) 38.0-38.9, adult: Secondary | ICD-10-CM

## 2018-03-02 DIAGNOSIS — R918 Other nonspecific abnormal finding of lung field: Secondary | ICD-10-CM | POA: Diagnosis not present

## 2018-03-02 DIAGNOSIS — Z978 Presence of other specified devices: Secondary | ICD-10-CM

## 2018-03-02 DIAGNOSIS — Z7901 Long term (current) use of anticoagulants: Secondary | ICD-10-CM

## 2018-03-02 DIAGNOSIS — Z794 Long term (current) use of insulin: Secondary | ICD-10-CM

## 2018-03-02 DIAGNOSIS — R0902 Hypoxemia: Secondary | ICD-10-CM | POA: Diagnosis not present

## 2018-03-02 DIAGNOSIS — I491 Atrial premature depolarization: Secondary | ICD-10-CM | POA: Diagnosis not present

## 2018-03-02 DIAGNOSIS — Z91048 Other nonmedicinal substance allergy status: Secondary | ICD-10-CM

## 2018-03-02 DIAGNOSIS — I2510 Atherosclerotic heart disease of native coronary artery without angina pectoris: Secondary | ICD-10-CM | POA: Diagnosis present

## 2018-03-02 DIAGNOSIS — I429 Cardiomyopathy, unspecified: Secondary | ICD-10-CM | POA: Diagnosis present

## 2018-03-02 DIAGNOSIS — E1165 Type 2 diabetes mellitus with hyperglycemia: Secondary | ICD-10-CM | POA: Diagnosis not present

## 2018-03-02 LAB — BLOOD GAS, ARTERIAL
Acid-base deficit: 1.9 mmol/L (ref 0.0–2.0)
Acid-base deficit: 1.7 mmol/L (ref 0.0–2.0)
Bicarbonate: 19.8 mmol/L — ABNORMAL LOW (ref 20.0–28.0)
Bicarbonate: 21.2 mmol/L (ref 20.0–28.0)
Delivery systems: POSITIVE
Delivery systems: POSITIVE
Drawn by: 21310
Drawn by: 21310
Expiratory PAP: 8
Expiratory PAP: 8
FIO2: 100
FIO2: 40
Inspiratory PAP: 16
Inspiratory PAP: 16
O2 Saturation: 98.3 %
O2 Saturation: 94.5 %
Patient temperature: 37
Patient temperature: 37
pCO2 arterial: 81.7 mmHg (ref 32.0–48.0)
pCO2 arterial: 60.5 mmHg — ABNORMAL HIGH (ref 32.0–48.0)
pH, Arterial: 7.134 — CL (ref 7.350–7.450)
pH, Arterial: 7.237 — ABNORMAL LOW (ref 7.350–7.450)
pO2, Arterial: 150 mmHg — ABNORMAL HIGH (ref 83.0–108.0)
pO2, Arterial: 83.2 mmHg (ref 83.0–108.0)

## 2018-03-02 LAB — CBC WITH DIFFERENTIAL/PLATELET
Basophils Absolute: 0 10*3/uL (ref 0.0–0.1)
Basophils Relative: 0 %
Eosinophils Absolute: 0 10*3/uL (ref 0.0–0.7)
Eosinophils Relative: 0 %
HCT: 46.2 % (ref 39.0–52.0)
Hemoglobin: 14.9 g/dL (ref 13.0–17.0)
Lymphocytes Relative: 8 %
Lymphs Abs: 0.6 10*3/uL — ABNORMAL LOW (ref 0.7–4.0)
MCH: 31.4 pg (ref 26.0–34.0)
MCHC: 32.3 g/dL (ref 30.0–36.0)
MCV: 97.5 fL (ref 78.0–100.0)
Monocytes Absolute: 0.3 10*3/uL (ref 0.1–1.0)
Monocytes Relative: 4 %
Neutro Abs: 6.2 10*3/uL (ref 1.7–7.7)
Neutrophils Relative %: 88 %
Platelets: 160 10*3/uL (ref 150–400)
RBC: 4.74 MIL/uL (ref 4.22–5.81)
RDW: 14.6 % (ref 11.5–15.5)
WBC: 7 10*3/uL (ref 4.0–10.5)

## 2018-03-02 LAB — COMPREHENSIVE METABOLIC PANEL
ALT: 24 U/L (ref 0–44)
AST: 28 U/L (ref 15–41)
Albumin: 4 g/dL (ref 3.5–5.0)
Alkaline Phosphatase: 90 U/L (ref 38–126)
Anion gap: 10 (ref 5–15)
BUN: 16 mg/dL (ref 8–23)
CO2: 26 mmol/L (ref 22–32)
Calcium: 9.1 mg/dL (ref 8.9–10.3)
Chloride: 105 mmol/L (ref 98–111)
Creatinine, Ser: 0.96 mg/dL (ref 0.61–1.24)
GFR calc Af Amer: >60 mL/min (ref >60)
GFR calc non Af Amer: >60 mL/min (ref >60)
Glucose, Bld: 204 mg/dL — ABNORMAL HIGH (ref 70–99)
Potassium: 4.2 mmol/L (ref 3.5–5.1)
Sodium: 141 mmol/L (ref 135–145)
Total Bilirubin: 1.1 mg/dL (ref 0.3–1.2)
Total Protein: 7.5 g/dL (ref 6.5–8.1)

## 2018-03-02 LAB — I-STAT CHEM 8, ED
BUN: 17 mg/dL (ref 8–23)
Calcium, Ion: 1.18 mmol/L (ref 1.15–1.40)
Chloride: 103 mmol/L (ref 98–111)
Creatinine, Ser: 0.9 mg/dL (ref 0.61–1.24)
Glucose, Bld: 201 mg/dL — ABNORMAL HIGH (ref 70–99)
HCT: 47 % (ref 39.0–52.0)
Hemoglobin: 16 g/dL (ref 13.0–17.0)
Potassium: 4.2 mmol/L (ref 3.5–5.1)
Sodium: 142 mmol/L (ref 135–145)
TCO2: 26 mmol/L (ref 22–32)

## 2018-03-02 LAB — I-STAT TROPONIN, ED: Troponin i, poc: 0.01 ng/mL (ref 0.00–0.08)

## 2018-03-02 LAB — LACTIC ACID, PLASMA
Lactic Acid, Venous: 0.9 mmol/L (ref 0.5–1.9)
Lactic Acid, Venous: 1.6 mmol/L (ref 0.5–1.9)

## 2018-03-02 LAB — BRAIN NATRIURETIC PEPTIDE: B Natriuretic Peptide: 435 pg/mL — ABNORMAL HIGH (ref 0.0–100.0)

## 2018-03-02 LAB — LIPASE, BLOOD: Lipase: 23 U/L (ref 11–51)

## 2018-03-02 LAB — TROPONIN I: Troponin I: <0.03 ng/mL (ref <0.03)

## 2018-03-02 LAB — MAGNESIUM: Magnesium: 2.1 mg/dL (ref 1.7–2.4)

## 2018-03-02 MED ORDER — ONDANSETRON HCL 4 MG/2ML IJ SOLN
4.0000 mg | Freq: Once | INTRAMUSCULAR | Status: DC
  Filled 2018-03-02: qty 2

## 2018-03-02 MED ORDER — ONDANSETRON HCL 4 MG/2ML IJ SOLN
INTRAMUSCULAR | Status: AC
  Administered 2018-03-02: 4 mg via INTRAVENOUS
  Filled 2018-03-02: qty 2

## 2018-03-02 MED ORDER — ONDANSETRON HCL 4 MG/2ML IJ SOLN
4.0000 mg | Freq: Once | INTRAMUSCULAR | Status: AC
  Administered 2018-03-02: 4 mg via INTRAVENOUS

## 2018-03-02 MED ORDER — METOPROLOL TARTRATE 5 MG/5ML IV SOLN
5.0000 mg | Freq: Once | INTRAVENOUS | Status: AC
  Administered 2018-03-02: 5 mg via INTRAVENOUS
  Filled 2018-03-02: qty 5

## 2018-03-02 MED ORDER — FUROSEMIDE 10 MG/ML IJ SOLN
80.0000 mg | Freq: Once | INTRAMUSCULAR | Status: AC
  Administered 2018-03-02: 80 mg via INTRAVENOUS
  Filled 2018-03-02: qty 8

## 2018-03-02 MED ORDER — FENTANYL CITRATE (PF) 100 MCG/2ML IJ SOLN
INTRAMUSCULAR | Status: AC
  Administered 2018-03-02: 100 ug via INTRAVENOUS
  Filled 2018-03-02: qty 2

## 2018-03-02 MED ORDER — SODIUM CHLORIDE 0.9 % IV SOLN
INTRAVENOUS | Status: DC
  Administered 2018-03-02: 19:00:00 via INTRAVENOUS

## 2018-03-02 MED ORDER — LORAZEPAM 2 MG/ML IJ SOLN
1.0000 mg | Freq: Once | INTRAMUSCULAR | Status: AC
  Administered 2018-03-02: 1 mg via INTRAVENOUS
  Filled 2018-03-02: qty 1

## 2018-03-02 MED ORDER — FENTANYL CITRATE (PF) 100 MCG/2ML IJ SOLN: 100.0000 ug | Freq: Once | INTRAMUSCULAR | Status: DC

## 2018-03-02 MED ORDER — FENTANYL CITRATE (PF) 100 MCG/2ML IJ SOLN
100.0000 ug | Freq: Once | INTRAMUSCULAR | Status: AC
  Administered 2018-03-02: 100 ug via INTRAVENOUS

## 2018-03-02 MED ORDER — POTASSIUM CHLORIDE CRYS ER 20 MEQ PO TBCR
60.0000 meq | EXTENDED_RELEASE_TABLET | Freq: Once | ORAL | Status: AC
  Administered 2018-03-02: 60 meq via ORAL
  Filled 2018-03-02: qty 3

## 2018-03-02 MED ORDER — DILTIAZEM LOAD VIA INFUSION
20.0000 mg | Freq: Once | INTRAVENOUS | Status: AC
  Administered 2018-03-02: 20 mg via INTRAVENOUS
  Filled 2018-03-02: qty 20

## 2018-03-02 MED ORDER — DILTIAZEM HCL-DEXTROSE 100-5 MG/100ML-% IV SOLN (PREMIX)
5.0000 mg/h | INTRAVENOUS | Status: DC
  Administered 2018-03-02 – 2018-03-03 (×2): 5 mg/h via INTRAVENOUS
  Administered 2018-03-04: 10 mg/h via INTRAVENOUS
  Administered 2018-03-04: 5 mg/h via INTRAVENOUS
  Administered 2018-03-05: 10 mg/h via INTRAVENOUS
  Filled 2018-03-02 (×5): qty 100

## 2018-03-02 NOTE — ED Notes (Signed)
A-FIB with Block and occasional PVC's noted on monitor per EDP. Pt placed on non-rebreather and intermittently suctioned due to productive cough per EDP order. EDP remains at bedside.

## 2018-03-02 NOTE — ED Notes (Addendum)
Pt very anxious, restless, and trying to take off oxygen, stating, "he has to get up". Dr Juleen China called to bedside. Security called to bedside.  New orders received for Ativan. Dr Juleen China offers reassurance to pt and explains plan of care.

## 2018-03-02 NOTE — ED Triage Notes (Signed)
Per ems, pt reports nausea and dry heaves since lunch. EMS reports pt in and out of V-tach en route to ED. Pt pale, alert and talking at time of arrival.
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
## 2018-03-02 NOTE — Progress Notes (Signed)
BIPAP changes made decreased fio2 40% and increased set rate to 12

## 2018-03-02 NOTE — Progress Notes (Signed)
The patient is now 45 days status post a right total hip arthroplasty.  He is doing well overall.  He is ambling with a cane and says therapy is working to transition him from that.  He has no significant issues he states.  He feels like he is doing well overall.  On examination his right hip incision looks good.  There is no significant issues on exam.  He tolerates me putting his hip with range of motion.  This point we do not need to see him back for 6 months.  If there is any issues before then he will let us know.  At that visit I would like a low AP pelvis and lateral of his right operative hip.

## 2018-03-03 ENCOUNTER — Inpatient Hospital Stay (HOSPITAL_COMMUNITY): Payer: Medicare Other | Admitting: Anesthesiology

## 2018-03-03 ENCOUNTER — Inpatient Hospital Stay (HOSPITAL_COMMUNITY): Payer: Medicare Other

## 2018-03-03 DIAGNOSIS — E785 Hyperlipidemia, unspecified: Secondary | ICD-10-CM | POA: Diagnosis not present

## 2018-03-03 DIAGNOSIS — J9600 Acute respiratory failure, unspecified whether with hypoxia or hypercapnia: Secondary | ICD-10-CM | POA: Diagnosis present

## 2018-03-03 DIAGNOSIS — N179 Acute kidney failure, unspecified: Secondary | ICD-10-CM | POA: Diagnosis not present

## 2018-03-03 DIAGNOSIS — R0602 Shortness of breath: Secondary | ICD-10-CM | POA: Diagnosis not present

## 2018-03-03 DIAGNOSIS — Y92230 Patient room in hospital as the place of occurrence of the external cause: Secondary | ICD-10-CM | POA: Diagnosis not present

## 2018-03-03 DIAGNOSIS — J441 Chronic obstructive pulmonary disease with (acute) exacerbation: Secondary | ICD-10-CM | POA: Diagnosis present

## 2018-03-03 DIAGNOSIS — I509 Heart failure, unspecified: Secondary | ICD-10-CM | POA: Diagnosis not present

## 2018-03-03 DIAGNOSIS — Z96641 Presence of right artificial hip joint: Secondary | ICD-10-CM | POA: Diagnosis present

## 2018-03-03 DIAGNOSIS — I428 Other cardiomyopathies: Secondary | ICD-10-CM | POA: Diagnosis not present

## 2018-03-03 DIAGNOSIS — I472 Ventricular tachycardia: Secondary | ICD-10-CM | POA: Diagnosis present

## 2018-03-03 DIAGNOSIS — J690 Pneumonitis due to inhalation of food and vomit: Secondary | ICD-10-CM | POA: Diagnosis not present

## 2018-03-03 DIAGNOSIS — R402254 Coma scale, best verbal response, oriented, 24 hours or more after hospital admission: Secondary | ICD-10-CM | POA: Diagnosis not present

## 2018-03-03 DIAGNOSIS — I447 Left bundle-branch block, unspecified: Secondary | ICD-10-CM | POA: Diagnosis present

## 2018-03-03 DIAGNOSIS — J9602 Acute respiratory failure with hypercapnia: Secondary | ICD-10-CM

## 2018-03-03 DIAGNOSIS — R918 Other nonspecific abnormal finding of lung field: Secondary | ICD-10-CM | POA: Diagnosis not present

## 2018-03-03 DIAGNOSIS — M6289 Other specified disorders of muscle: Secondary | ICD-10-CM | POA: Diagnosis not present

## 2018-03-03 DIAGNOSIS — F332 Major depressive disorder, recurrent severe without psychotic features: Secondary | ICD-10-CM | POA: Diagnosis present

## 2018-03-03 DIAGNOSIS — Z7901 Long term (current) use of anticoagulants: Secondary | ICD-10-CM | POA: Diagnosis not present

## 2018-03-03 DIAGNOSIS — I429 Cardiomyopathy, unspecified: Secondary | ICD-10-CM | POA: Diagnosis present

## 2018-03-03 DIAGNOSIS — I5023 Acute on chronic systolic (congestive) heart failure: Secondary | ICD-10-CM

## 2018-03-03 DIAGNOSIS — R402134 Coma scale, eyes open, to sound, 24 hours or more after hospital admission: Secondary | ICD-10-CM | POA: Diagnosis not present

## 2018-03-03 DIAGNOSIS — R402364 Coma scale, best motor response, obeys commands, 24 hours or more after hospital admission: Secondary | ICD-10-CM | POA: Diagnosis not present

## 2018-03-03 DIAGNOSIS — T17990A Other foreign object in respiratory tract, part unspecified in causing asphyxiation, initial encounter: Secondary | ICD-10-CM | POA: Diagnosis present

## 2018-03-03 DIAGNOSIS — J9601 Acute respiratory failure with hypoxia: Secondary | ICD-10-CM | POA: Diagnosis present

## 2018-03-03 DIAGNOSIS — Z794 Long term (current) use of insulin: Secondary | ICD-10-CM

## 2018-03-03 DIAGNOSIS — I2510 Atherosclerotic heart disease of native coronary artery without angina pectoris: Secondary | ICD-10-CM | POA: Diagnosis present

## 2018-03-03 DIAGNOSIS — I5022 Chronic systolic (congestive) heart failure: Secondary | ICD-10-CM | POA: Diagnosis not present

## 2018-03-03 DIAGNOSIS — Z4682 Encounter for fitting and adjustment of non-vascular catheter: Secondary | ICD-10-CM | POA: Diagnosis not present

## 2018-03-03 DIAGNOSIS — E1143 Type 2 diabetes mellitus with diabetic autonomic (poly)neuropathy: Secondary | ICD-10-CM | POA: Diagnosis not present

## 2018-03-03 DIAGNOSIS — I872 Venous insufficiency (chronic) (peripheral): Secondary | ICD-10-CM | POA: Diagnosis present

## 2018-03-03 DIAGNOSIS — F329 Major depressive disorder, single episode, unspecified: Secondary | ICD-10-CM | POA: Diagnosis not present

## 2018-03-03 DIAGNOSIS — E1165 Type 2 diabetes mellitus with hyperglycemia: Secondary | ICD-10-CM | POA: Diagnosis not present

## 2018-03-03 DIAGNOSIS — J449 Chronic obstructive pulmonary disease, unspecified: Secondary | ICD-10-CM | POA: Diagnosis not present

## 2018-03-03 DIAGNOSIS — R262 Difficulty in walking, not elsewhere classified: Secondary | ICD-10-CM | POA: Diagnosis not present

## 2018-03-03 DIAGNOSIS — E118 Type 2 diabetes mellitus with unspecified complications: Secondary | ICD-10-CM | POA: Diagnosis not present

## 2018-03-03 DIAGNOSIS — G4730 Sleep apnea, unspecified: Secondary | ICD-10-CM | POA: Diagnosis present

## 2018-03-03 DIAGNOSIS — I482 Chronic atrial fibrillation: Secondary | ICD-10-CM | POA: Diagnosis not present

## 2018-03-03 DIAGNOSIS — I5043 Acute on chronic combined systolic (congestive) and diastolic (congestive) heart failure: Secondary | ICD-10-CM | POA: Diagnosis not present

## 2018-03-03 DIAGNOSIS — Z952 Presence of prosthetic heart valve: Secondary | ICD-10-CM | POA: Diagnosis not present

## 2018-03-03 DIAGNOSIS — J9611 Chronic respiratory failure with hypoxia: Secondary | ICD-10-CM | POA: Diagnosis not present

## 2018-03-03 DIAGNOSIS — E872 Acidosis: Secondary | ICD-10-CM | POA: Diagnosis present

## 2018-03-03 DIAGNOSIS — F419 Anxiety disorder, unspecified: Secondary | ICD-10-CM | POA: Diagnosis present

## 2018-03-03 DIAGNOSIS — J9622 Acute and chronic respiratory failure with hypercapnia: Secondary | ICD-10-CM | POA: Diagnosis not present

## 2018-03-03 DIAGNOSIS — N289 Disorder of kidney and ureter, unspecified: Secondary | ICD-10-CM | POA: Diagnosis not present

## 2018-03-03 DIAGNOSIS — J9690 Respiratory failure, unspecified, unspecified whether with hypoxia or hypercapnia: Secondary | ICD-10-CM | POA: Diagnosis not present

## 2018-03-03 DIAGNOSIS — I110 Hypertensive heart disease with heart failure: Secondary | ICD-10-CM | POA: Diagnosis present

## 2018-03-03 DIAGNOSIS — I10 Essential (primary) hypertension: Secondary | ICD-10-CM | POA: Diagnosis not present

## 2018-03-03 DIAGNOSIS — J9621 Acute and chronic respiratory failure with hypoxia: Secondary | ICD-10-CM | POA: Diagnosis not present

## 2018-03-03 DIAGNOSIS — J9811 Atelectasis: Secondary | ICD-10-CM | POA: Diagnosis not present

## 2018-03-03 DIAGNOSIS — M6281 Muscle weakness (generalized): Secondary | ICD-10-CM | POA: Diagnosis not present

## 2018-03-03 LAB — COMPREHENSIVE METABOLIC PANEL
ALT: 22 U/L (ref 0–44)
AST: 27 U/L (ref 15–41)
Albumin: 3.7 g/dL (ref 3.5–5.0)
Alkaline Phosphatase: 96 U/L (ref 38–126)
Anion gap: 10 (ref 5–15)
BUN: 22 mg/dL (ref 8–23)
CO2: 25 mmol/L (ref 22–32)
Calcium: 9.1 mg/dL (ref 8.9–10.3)
Chloride: 107 mmol/L (ref 98–111)
Creatinine, Ser: 1.31 mg/dL — ABNORMAL HIGH (ref 0.61–1.24)
GFR calc Af Amer: >60 mL/min (ref >60)
GFR calc non Af Amer: 55 mL/min — ABNORMAL LOW (ref >60)
Glucose, Bld: 232 mg/dL — ABNORMAL HIGH (ref 70–99)
Potassium: 5.2 mmol/L — ABNORMAL HIGH (ref 3.5–5.1)
Sodium: 142 mmol/L (ref 135–145)
Total Bilirubin: 1 mg/dL (ref 0.3–1.2)
Total Protein: 7.3 g/dL (ref 6.5–8.1)

## 2018-03-03 LAB — BLOOD GAS, ARTERIAL
Acid-Base Excess: 0.8 mmol/L (ref 0.0–2.0)
Bicarbonate: 23.9 mmol/L (ref 20.0–28.0)
Delivery systems: POSITIVE
Drawn by: 21310
Expiratory PAP: 8
FIO2: 50
Inspiratory PAP: 16
O2 Saturation: 89.4 %
Patient temperature: 37
pCO2 arterial: 51.9 mmHg — ABNORMAL HIGH (ref 32.0–48.0)
pH, Arterial: 7.323 — ABNORMAL LOW (ref 7.350–7.450)
pO2, Arterial: 60.3 mmHg — ABNORMAL LOW (ref 83.0–108.0)

## 2018-03-03 LAB — URINALYSIS, ROUTINE W REFLEX MICROSCOPIC
Bilirubin Urine: NEGATIVE
Glucose, UA: >=500 mg/dL — AB
Hgb urine dipstick: NEGATIVE
Ketones, ur: 5 mg/dL — AB
Leukocytes, UA: NEGATIVE
Nitrite: NEGATIVE
Protein, ur: 100 mg/dL — AB
Specific Gravity, Urine: 1.014 (ref 1.005–1.030)
pH: 5 (ref 5.0–8.0)

## 2018-03-03 LAB — GLUCOSE, CAPILLARY
Glucose-Capillary: 219 mg/dL — ABNORMAL HIGH (ref 70–99)
Glucose-Capillary: 194 mg/dL — ABNORMAL HIGH (ref 70–99)
Glucose-Capillary: 205 mg/dL — ABNORMAL HIGH (ref 70–99)
Glucose-Capillary: 184 mg/dL — ABNORMAL HIGH (ref 70–99)

## 2018-03-03 LAB — CBC
HCT: 50.7 % (ref 39.0–52.0)
Hemoglobin: 16 g/dL (ref 13.0–17.0)
MCH: 31.6 pg (ref 26.0–34.0)
MCHC: 31.6 g/dL (ref 30.0–36.0)
MCV: 100 fL (ref 78.0–100.0)
Platelets: 184 10*3/uL (ref 150–400)
RBC: 5.07 MIL/uL (ref 4.22–5.81)
RDW: 14.9 % (ref 11.5–15.5)
WBC: 15.4 10*3/uL — ABNORMAL HIGH (ref 4.0–10.5)

## 2018-03-03 LAB — HEMOGLOBIN A1C
Hgb A1c MFr Bld: 6 % — ABNORMAL HIGH (ref 4.8–5.6)
Mean Plasma Glucose: 125.5 mg/dL

## 2018-03-03 LAB — MRSA PCR SCREENING: MRSA by PCR: NEGATIVE

## 2018-03-03 MED ORDER — SIMVASTATIN 20 MG PO TABS: 20.0000 mg | ORAL_TABLET | Freq: Every evening | ORAL | Status: DC

## 2018-03-03 MED ORDER — FENTANYL CITRATE (PF) 100 MCG/2ML IJ SOLN
50.0000 ug | INTRAMUSCULAR | Status: DC | PRN
  Administered 2018-03-03 – 2018-03-04 (×3): 50 ug via INTRAVENOUS
  Filled 2018-03-03 (×2): qty 2

## 2018-03-03 MED ORDER — ETOMIDATE 2 MG/ML IV SOLN
INTRAVENOUS | Status: DC | PRN
  Administered 2018-03-03: 5 mg via INTRAVENOUS

## 2018-03-03 MED ORDER — METOCLOPRAMIDE HCL 5 MG/ML IJ SOLN: 10.0000 mg | Freq: Four times a day (QID) | INTRAMUSCULAR | Status: DC | PRN

## 2018-03-03 MED ORDER — FUROSEMIDE 10 MG/ML IJ SOLN
40.0000 mg | Freq: Two times a day (BID) | INTRAMUSCULAR | Status: DC
  Administered 2018-03-03 – 2018-03-04 (×3): 40 mg via INTRAVENOUS
  Filled 2018-03-03 (×4): qty 4

## 2018-03-03 MED ORDER — IPRATROPIUM BROMIDE 0.02 % IN SOLN: 0.5000 mg | Freq: Four times a day (QID) | RESPIRATORY_TRACT | Status: DC

## 2018-03-03 MED ORDER — INSULIN ASPART 100 UNIT/ML ~~LOC~~ SOLN
0.0000 [IU] | Freq: Four times a day (QID) | SUBCUTANEOUS | Status: DC
  Administered 2018-03-03: 3 [IU] via SUBCUTANEOUS
  Administered 2018-03-03: 2 [IU] via SUBCUTANEOUS
  Administered 2018-03-03: 3 [IU] via SUBCUTANEOUS

## 2018-03-03 MED ORDER — SACUBITRIL-VALSARTAN 24-26 MG PO TABS
1.0000 | ORAL_TABLET | Freq: Two times a day (BID) | ORAL | Status: DC
  Filled 2018-03-03 (×3): qty 1

## 2018-03-03 MED ORDER — MUPIROCIN 2 % EX OINT
1.0000 "application " | TOPICAL_OINTMENT | Freq: Every day | CUTANEOUS | Status: DC
  Administered 2018-03-03 – 2018-03-08 (×5): 1 via TOPICAL
  Filled 2018-03-03 (×3): qty 22

## 2018-03-03 MED ORDER — PIPERACILLIN-TAZOBACTAM 3.375 G IVPB 30 MIN
3.3750 g | Freq: Once | INTRAVENOUS | Status: AC
  Administered 2018-03-03: 3.375 g via INTRAVENOUS
  Filled 2018-03-03 (×2): qty 50

## 2018-03-03 MED ORDER — FUROSEMIDE 10 MG/ML IJ SOLN
40.0000 mg | Freq: Once | INTRAMUSCULAR | Status: AC
  Administered 2018-03-03: 40 mg via INTRAVENOUS

## 2018-03-03 MED ORDER — SUCCINYLCHOLINE CHLORIDE 20 MG/ML IJ SOLN: 120.0000 mg | Freq: Once | INTRAMUSCULAR | Status: DC

## 2018-03-03 MED ORDER — MIDAZOLAM HCL 2 MG/2ML IJ SOLN
1.0000 mg | INTRAMUSCULAR | Status: DC | PRN
  Administered 2018-03-03 – 2018-03-04 (×3): 1 mg via INTRAVENOUS
  Filled 2018-03-03 (×3): qty 2

## 2018-03-03 MED ORDER — POLYETHYLENE GLYCOL 3350 17 G PO PACK: 17.0000 g | PACK | Freq: Every day | ORAL | Status: DC | PRN

## 2018-03-03 MED ORDER — ETOMIDATE 2 MG/ML IV SOLN: 30.0000 mg | Freq: Once | INTRAVENOUS | Status: DC

## 2018-03-03 MED ORDER — TIZANIDINE HCL 4 MG PO TABS: 4.0000 mg | ORAL_TABLET | Freq: Three times a day (TID) | ORAL | Status: DC | PRN

## 2018-03-03 MED ORDER — INSULIN ASPART 100 UNIT/ML ~~LOC~~ SOLN
0.0000 [IU] | SUBCUTANEOUS | Status: DC
  Administered 2018-03-03 – 2018-03-04 (×4): 2 [IU] via SUBCUTANEOUS

## 2018-03-03 MED ORDER — METOCLOPRAMIDE HCL 5 MG/ML IJ SOLN
10.0000 mg | Freq: Four times a day (QID) | INTRAMUSCULAR | Status: DC | PRN
  Administered 2018-03-03 – 2018-03-04 (×2): 10 mg via INTRAVENOUS
  Filled 2018-03-03 (×2): qty 2

## 2018-03-03 MED ORDER — PIPERACILLIN-TAZOBACTAM 3.375 G IVPB
3.3750 g | Freq: Three times a day (TID) | INTRAVENOUS | Status: DC
  Administered 2018-03-03 – 2018-03-06 (×9): 3.375 g via INTRAVENOUS
  Filled 2018-03-03 (×9): qty 50

## 2018-03-03 MED ORDER — ONDANSETRON HCL 4 MG PO TABS: 4.0000 mg | ORAL_TABLET | Freq: Four times a day (QID) | ORAL | Status: DC | PRN

## 2018-03-03 MED ORDER — FENTANYL CITRATE (PF) 100 MCG/2ML IJ SOLN
50.0000 ug | INTRAMUSCULAR | Status: DC | PRN
  Administered 2018-03-03 (×2): 50 ug via INTRAVENOUS
  Filled 2018-03-03 (×3): qty 2

## 2018-03-03 MED ORDER — ORAL CARE MOUTH RINSE: 15.0000 mL | Freq: Two times a day (BID) | OROMUCOSAL | Status: DC

## 2018-03-03 MED ORDER — CHLORHEXIDINE GLUCONATE 0.12 % MT SOLN
15.0000 mL | Freq: Two times a day (BID) | OROMUCOSAL | Status: DC
  Administered 2018-03-03: 15 mL via OROMUCOSAL

## 2018-03-03 MED ORDER — METHYLPREDNISOLONE SODIUM SUCC 40 MG IJ SOLR
40.0000 mg | Freq: Two times a day (BID) | INTRAMUSCULAR | Status: DC
  Administered 2018-03-03 – 2018-03-08 (×11): 40 mg via INTRAVENOUS
  Filled 2018-03-03 (×11): qty 1

## 2018-03-03 MED ORDER — SODIUM CHLORIDE 0.9% FLUSH
3.0000 mL | Freq: Two times a day (BID) | INTRAVENOUS | Status: DC
  Administered 2018-03-03 – 2018-03-08 (×11): 3 mL via INTRAVENOUS

## 2018-03-03 MED ORDER — INSULIN ASPART 100 UNIT/ML ~~LOC~~ SOLN: 0.0000 [IU] | Freq: Four times a day (QID) | SUBCUTANEOUS | Status: DC

## 2018-03-03 MED ORDER — SODIUM CHLORIDE 0.9 % IV SOLN: 250.0000 mL | INTRAVENOUS | Status: DC | PRN

## 2018-03-03 MED ORDER — SUCCINYLCHOLINE CHLORIDE 20 MG/ML IJ SOLN
INTRAMUSCULAR | Status: DC | PRN
  Administered 2018-03-03: 100 mg via INTRAVENOUS

## 2018-03-03 MED ORDER — ONDANSETRON HCL 4 MG/2ML IJ SOLN
4.0000 mg | Freq: Once | INTRAMUSCULAR | Status: AC
  Administered 2018-03-03: 4 mg via INTRAVENOUS

## 2018-03-03 MED ORDER — INSULIN GLARGINE 100 UNIT/ML ~~LOC~~ SOLN
70.0000 [IU] | Freq: Every day | SUBCUTANEOUS | Status: DC
  Administered 2018-03-03 – 2018-03-07 (×5): 70 [IU] via SUBCUTANEOUS
  Filled 2018-03-03 (×6): qty 0.7

## 2018-03-03 MED ORDER — CHLORHEXIDINE GLUCONATE 0.12% ORAL RINSE (MEDLINE KIT)
15.0000 mL | Freq: Two times a day (BID) | OROMUCOSAL | Status: DC
  Administered 2018-03-03 – 2018-03-04 (×3): 15 mL via OROMUCOSAL

## 2018-03-03 MED ORDER — SODIUM CHLORIDE 0.9% FLUSH: 3.0000 mL | INTRAVENOUS | Status: DC | PRN

## 2018-03-03 MED ORDER — VILAZODONE HCL 40 MG PO TABS: 40.0000 mg | ORAL_TABLET | Freq: Every day | ORAL | Status: DC

## 2018-03-03 MED ORDER — FAMOTIDINE IN NACL 20-0.9 MG/50ML-% IV SOLN
20.0000 mg | Freq: Two times a day (BID) | INTRAVENOUS | Status: DC
  Administered 2018-03-03 – 2018-03-07 (×10): 20 mg via INTRAVENOUS
  Filled 2018-03-03 (×11): qty 50

## 2018-03-03 MED ORDER — MIDAZOLAM HCL 2 MG/2ML IJ SOLN
1.0000 mg | INTRAMUSCULAR | Status: DC | PRN
  Administered 2018-03-03 (×2): 1 mg via INTRAVENOUS
  Filled 2018-03-03 (×2): qty 2

## 2018-03-03 MED ORDER — ORAL CARE MOUTH RINSE
15.0000 mL | OROMUCOSAL | Status: DC
  Administered 2018-03-03 – 2018-03-04 (×15): 15 mL via OROMUCOSAL

## 2018-03-03 MED ORDER — ALBUTEROL SULFATE (2.5 MG/3ML) 0.083% IN NEBU: 2.5000 mg | INHALATION_SOLUTION | Freq: Four times a day (QID) | RESPIRATORY_TRACT | Status: DC

## 2018-03-03 MED ORDER — ONDANSETRON HCL 4 MG/2ML IJ SOLN
4.0000 mg | Freq: Four times a day (QID) | INTRAMUSCULAR | Status: DC | PRN
  Administered 2018-03-04 – 2018-03-06 (×2): 4 mg via INTRAVENOUS
  Filled 2018-03-03 (×2): qty 2

## 2018-03-03 MED ORDER — RIVAROXABAN 20 MG PO TABS: 20.0000 mg | ORAL_TABLET | Freq: Every evening | ORAL | Status: DC

## 2018-03-03 MED ORDER — CARVEDILOL 3.125 MG PO TABS: 6.2500 mg | ORAL_TABLET | Freq: Two times a day (BID) | ORAL | Status: DC

## 2018-03-03 MED ORDER — ACETAMINOPHEN 650 MG RE SUPP
650.0000 mg | RECTAL | Status: DC | PRN
  Administered 2018-03-03: 650 mg via RECTAL
  Filled 2018-03-03: qty 1

## 2018-03-03 MED ORDER — IPRATROPIUM-ALBUTEROL 0.5-2.5 (3) MG/3ML IN SOLN
3.0000 mL | Freq: Four times a day (QID) | RESPIRATORY_TRACT | Status: DC
  Administered 2018-03-03 – 2018-03-07 (×16): 3 mL via RESPIRATORY_TRACT
  Filled 2018-03-03 (×16): qty 3

## 2018-03-03 MED ORDER — COLCHICINE 0.6 MG PO TABS
0.6000 mg | ORAL_TABLET | Freq: Two times a day (BID) | ORAL | Status: DC
  Administered 2018-03-04 – 2018-03-08 (×8): 0.6 mg via ORAL
  Filled 2018-03-03 (×9): qty 1

## 2018-03-03 MED ORDER — ENOXAPARIN SODIUM 120 MG/0.8ML ~~LOC~~ SOLN
120.0000 mg | Freq: Two times a day (BID) | SUBCUTANEOUS | Status: DC
  Administered 2018-03-03 – 2018-03-04 (×4): 120 mg via SUBCUTANEOUS
  Filled 2018-03-03 (×4): qty 0.8

## 2018-03-03 MED ORDER — PROPOFOL 1000 MG/100ML IV EMUL
INTRAVENOUS | Status: AC
  Filled 2018-03-03: qty 100

## 2018-03-03 NOTE — Progress Notes (Signed)
Pt vomited into Bipap around 0530 this morning. Bipap removed and started on high flow nasal canula. Pt requested reglan for nausea which was ordered by [place] MD and given. Oxygen saturations remained in the 80s but started to drop into the 70s. Intubation discussed with the patient. Dr. Ardyth Harps notified at 0700.

## 2018-03-03 NOTE — Progress Notes (Signed)
O2 sat 74% on monitor upon arriving to 7am shift. Pt found having difficulty breathing on HF at 15L. Placed back on non-rebreather. Paged Respiratory and RT came to bedside. Dr. Laural Benes came to bedside and agreed that this pt was in need of intubation and anesthesiology was paged. Alert and oriented. Agreeable to procedure. Dr. Ardyth Harps at bedside. Pt intubated at 0752. Several nurses attempted to place NG/OG tube with no success. Later during the shift Dr. Pia Mau from radiology came and inserted NG tube. Placement verified with xray. Tube to LIWS. Pt tolerated procedure well. VSS. Continues on Cardizem gtt at 5. HR irregular.

## 2018-03-03 NOTE — Progress Notes (Signed)
ANTICOAGULATION CONSULT NOTE - Initial Consult  Pharmacy Consult for Lovenox Indication: atrial fibrillation  Allergies  Allergen Reactions  . Daptomycin Rash  . Lisinopril Cough  . Tape Rash and Other (See Comments)    Adhesive Tape-Burn skin.    Patient Measurements: Height: 5\' 9"  (175.3 cm) Weight: 274 lb 7.6 oz (124.5 kg) IBW/kg (Calculated) : 70.7 Heparin Dosing Weight:   Vital Signs: Temp: 97.5 F (36.4 C) (08/27 0718) Temp Source: Axillary (08/27 0718) BP: 141/79 (08/27 0730) Pulse Rate: 113 (08/27 0730)  Labs: Recent Labs    03/02/18 1835 03/02/18 1837 03/03/18 0431  HGB 14.9 16.0 16.0  HCT 46.2 47.0 50.7  PLT 160  --  184  CREATININE 0.96 0.90 1.31*  TROPONINI <0.03  --   --     Estimated Creatinine Clearance: 71.4 mL/min (A) (by C-G formula based on SCr of 1.31 mg/dL (H)).   Medical History: Past Medical History:  Diagnosis Date  . A-fib (HCC)   . Anxiety   . Atrial fibrillation (HCC)   . Cellulitis   . CHF (congestive heart failure) (HCC)   . Chronic systolic heart failure (HCC) 11/2012  . Chronic venous insufficiency   . COPD (chronic obstructive pulmonary disease) (HCC)   . Coronary atherosclerosis of native coronary artery    Mild nonobstructive 08/2012  . Degenerative joint disease   . Diabetes mellitus, type II (HCC)    Gastroparesis; GI care at [facility]  . 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 s/p zyvox rx.  (Cultures never positive); left bundle branch block; H/o SVT; 09/2012: bioprosthetic MVR at [facility]; a. Severe dental caries and cavities s/p multiple extractions.  . Essential hypertension, benign   . 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 Left bundle branch block   . Left leg cellulitis   . Major depressive disorder, recurrent severe without psychotic features (HCC)    BH admission 12/2012  . Morbid obesity (HCC) 06/27/2012  . Nephrolithiasis   . PSVT (paroxysmal supraventricular tachycardia)  (HCC)    Post-op at [facility]  . Sleep apnea    i use to use a device a long time ago but i dont anymore   . Urinary tract infection 08/30/2012   Proteus mirabilis    Assessment: Pharmacy consulted to dose enoxaparin for this 30 yom with chronic atrial fibrillation, anti-coagulated with Xarelto.  He took his last dose  of Xarelto on 03-01-18.  Goal of Therapy:  Monitor platelets by anticoagulation protocol: Yes   Plan:  Start enoxaparin 120mg  sub-q q12h Monitor CBC, BMET and patient progress.  Tama High 03/03/2018,8:48 AM

## 2018-03-03 NOTE — Consult Note (Addendum)
Consult requested by: [facility] hospitalist Dr. Lorain Childes Consult requested for: Respiratory failure  HPI: This is a 67 year old who has chronic atrial fib on chronic anticoagulation chronic systolic heart failure diabetes history of endocarditis bioprosthetic mitral valve hypertension COPD morbid obesity.  He was in his usual state of fair health when he developed nausea with dry heaving after eating lunch earlier on the day of admission.  In the emergency department he was found to have significant shortness of breath his blood gas showed respiratory acidemia with his PCO2 elevated in the 80s.  He was started on BiPAP.  He vomited into the BiPAP mask and this morning plans were being made to intubate but then he seemed to improve but has deteriorated again.  He seems to have vomited again.  Repeat chest x-ray that was done around 330 this morning shows more of the right lower lobe infiltrate consistent with aspiration pneumonia.  He had pulmonary edema pattern with against perhaps some right lower lobe infiltrate on his initial film I reviewed both films personally.  He has received Lasix in the emergency department.  He is having significantly elevated respiratory rate his oxygen saturations down in the 70s on BiPAP his respirations are about 30.  I agree with Dr. Laural Benes who is attending at bedside that we need to go ahead with intubation and mechanical ventilation.  Past Medical History:  Diagnosis Date  . A-fib (HCC)   . Anxiety   . Atrial fibrillation (HCC)   . Cellulitis   . CHF (congestive heart failure) (HCC)   . Chronic systolic heart failure (HCC) 11/2012  . Chronic venous insufficiency   . COPD (chronic obstructive pulmonary disease) (HCC)   . Coronary atherosclerosis of native coronary artery    Mild nonobstructive 08/2012  . Degenerative joint disease   . Diabetes mellitus, type II (HCC)    Gastroparesis; GI care at [facility]  . 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 s/p zyvox  rx.  (Cultures never positive); left bundle branch block; H/o SVT; 09/2012: bioprosthetic MVR at [facility]; a. Severe dental caries and cavities s/p multiple extractions.  . Essential hypertension, benign   . 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 Left bundle branch block   . Left leg cellulitis   . Major depressive disorder, recurrent severe without psychotic features (HCC)    BH admission 12/2012  . Morbid obesity (HCC) 06/27/2012  . Nephrolithiasis   . PSVT (paroxysmal supraventricular tachycardia) (HCC)    Post-op at [facility]  . Sleep apnea    i use to use a device a long time ago but i dont anymore   . Urinary tract infection 08/30/2012   Proteus mirabilis     Family History  Problem Relation Age of Onset  . Lung cancer Father        died @ 73  . Alcohol abuse Father   . Heart disease Father   . Diabetes Father   . Arthritis Father   . Ovarian cancer Mother        died @ 25  . Hypertension Sister   . Hypertension Sister   . Hypertension Sister   . Hypertension Brother   . Early death Neg Hx   . Hyperlipidemia Neg Hx   . Kidney disease Neg Hx   . Stroke Neg Hx      Social History   Socioeconomic History  . Marital status: Legally Separated    Spouse name: Not on file  .  Number of children: Not on file  . Years of education: Not on file  . Highest education level: Not on file  Occupational History  . Not on file  Social Needs  . Financial resource strain: Not hard at all  . Food insecurity:    Worry: Never true    Inability: Never true  . Transportation needs:    Medical: No    Non-medical: No  Tobacco Use  . Smoking status: Former Smoker    Packs/day: 1.00    Years: 20.00    Pack years: 20.00    Types: Cigarettes    Last attempt to quit: 07/08/1992    Years since quitting: 25.6  . Smokeless tobacco: Never Used  . Tobacco comment: smoked about 1.5ppd x 15 yrs, quit 15 yrs ago.  Substance and Sexual Activity  . Alcohol  use: No    Alcohol/week: 0.0 standard drinks  . Drug use: No  . Sexual activity: Not Currently  Lifestyle  . Physical activity:    Days per week: 0 days    Minutes per session: 0 min  . Stress: Only a little  Relationships  . Social connections:    Talks on phone: More than three times a week    Gets together: More than three times a week    Attends religious service: More than 4 times per year    Active member of club or organization: Not on file    Attends meetings of clubs or organizations: More than 4 times per year    Relationship status: Separated  Other Topics Concern  . Not on file  Social History Narrative   Lives in [place] --split with his wife 12/2012.  Has one living daughter, 2 grandchildren.   Had a son who died of a brain tumor at age 9yrs.   No longer works since having mitral valve replacement.   Does not routinely exercise but starts cardiac rehab 11/18/12.   Tob 30 pack-yr hx, quit 1990s.     Alcohol: none in 30 yrs.  Distant history of heavy alcohol use.   No drug use.                    ROS: Unobtainable because of patient's condition    Objective: Vital signs in last 24 hours: Temp:  [97.1 F (36.2 C)-97.6 F (36.4 C)] 97.5 F (36.4 C) (08/27 0718) Pulse Rate:  [71-153] 113 (08/27 0730) Resp:  [15-35] 23 (08/27 0730) BP: (115-169)/(57-135) 141/79 (08/27 0730) SpO2:  [72 %-100 %] 72 % (08/27 0730) FiO2 (%):  [50 %] 50 % (08/27 0502) Weight:  [117.9 kg-124.5 kg] 124.5 kg (08/27 0502) Weight change:     Intake/Output from previous day: 08/26 0701 - 08/27 0700 In: 88.4 [I.V.:88.4] Out: 400 [Urine:400]  PHYSICAL EXAM Constitutional: He is awake but in distress.  Eyes: Pupils react.  Ears nose mouth and throat: He has some secretions in his oral airway.  Cardiovascular: He is with atrial fib with rapid ventricular response.  Respiratory: Respiratory effort increased.  He is on BiPAP.  He has marked bilateral rhonchi.  Gastrointestinal: His  abdomen is soft obese without masses.  Musculoskeletal: He is moving all 4 extremities but I cannot assess otherwise because of his condition.  Neurological: Moving all 4 extremities but really cannot assess otherwise because of condition.  Psychiatric: Cannot assess because of his condition.  Lab Results: Basic Metabolic Panel: Recent Labs    03/02/18 1835 03/02/18 1837 03/03/18 0431  NA 141 142 142  K 4.2 4.2 5.2*  CL 105 103 107  CO2 26  --  25  GLUCOSE 204* 201* 232*  BUN 16 17 22   CREATININE 0.96 0.90 1.31*  CALCIUM 9.1  --  9.1  MG 2.1  --   --    Liver Function Tests: Recent Labs    03/02/18 1835 03/03/18 0431  AST 28 27  ALT 24 22  ALKPHOS 90 96  BILITOT 1.1 1.0  PROT 7.5 7.3  ALBUMIN 4.0 3.7   Recent Labs    03/02/18 1835  LIPASE 23   No results for input(s): AMMONIA in the last 72 hours. CBC: Recent Labs    03/02/18 1835 03/02/18 1837 03/03/18 0431  WBC 7.0  --  15.4*  NEUTROABS 6.2  --   --   HGB 14.9 16.0 16.0  HCT 46.2 47.0 50.7  MCV 97.5  --  100.0  PLT 160  --  184   Cardiac Enzymes: Recent Labs    03/02/18 1835  TROPONINI <0.03   BNP: No results for input(s): PROBNP in the last 72 hours. D-Dimer: No results for input(s): DDIMER in the last 72 hours. CBG: Recent Labs    03/03/18 0614  GLUCAP 219*   Hemoglobin A1C: No results for input(s): HGBA1C in the last 72 hours. Fasting Lipid Panel: No results for input(s): CHOL, HDL, LDLCALC, TRIG, CHOLHDL, LDLDIRECT in the last 72 hours. Thyroid Function Tests: No results for input(s): TSH, T4TOTAL, FREET4, T3FREE, THYROIDAB in the last 72 hours. Anemia Panel: No results for input(s): VITAMINB12, FOLATE, FERRITIN, TIBC, IRON, RETICCTPCT in the last 72 hours. Coagulation: No results for input(s): LABPROT, INR in the last 72 hours. Urine Drug Screen: Drugs of Abuse     Component Value Date/Time   LABOPIA NONE DETECTED 12/24/2012 1614   COCAINSCRNUR NONE DETECTED 12/24/2012 1614    LABBENZ NONE DETECTED 12/24/2012 1614   AMPHETMU NONE DETECTED 12/24/2012 1614   THCU NONE DETECTED 12/24/2012 1614   LABBARB NONE DETECTED 12/24/2012 1614    Alcohol Level: No results for input(s): ETH in the last 72 hours. Urinalysis: Recent Labs    03/03/18 0430  COLORURINE YELLOW  LABSPEC 1.014  PHURINE 5.0  GLUCOSEU >=500*  HGBUR NEGATIVE  BILIRUBINUR NEGATIVE  KETONESUR 5*  PROTEINUR 100*  NITRITE NEGATIVE  LEUKOCYTESUR NEGATIVE   Misc. Labs:   ABGS: Recent Labs    03/02/18 1837  03/03/18 0415  PHART  --    < > 7.323*  PO2ART  --    < > 60.3*  TCO2 26  --   --   HCO3  --    < > 23.9   < > = values in this interval not displayed.     MICROBIOLOGY: Recent Results (from the past 240 hour(s))  MRSA PCR Screening     Status: None   Collection Time: 03/03/18  4:02 AM  Result Value Ref Range Status   MRSA by PCR NEGATIVE NEGATIVE Final    Comment:        The GeneXpert MRSA Assay (FDA approved for NASAL specimens only), is one component of a comprehensive MRSA colonization surveillance program. It is not intended to diagnose MRSA infection nor to guide or monitor treatment for MRSA infections. Performed at [facility], [address] [place], [place] [address]     Studies/Results: Dg Chest Portable 1 View  Result Date: 03/03/2018 CLINICAL DATA:  67 year old male with shortness of breath. EXAM: PORTABLE CHEST 1 VIEW  COMPARISON:  Chest radiograph dated 03/02/2018 FINDINGS: There is shallow inspiration with bibasilar atelectasis versus infiltrate. Probable small bilateral pleural effusions. The right costophrenic angle has been excluded from the image. Overall there is poor inspiratory effort compared to the prior radiograph with increased bibasilar densities. No pneumothorax. Stable cardiomegaly. Median sternotomy wires. No acute osseous pathology. Old right rib fractures. IMPRESSION: Shallow inspiration with bibasilar atelectasis versus infiltrate and  probable small bilateral pleural effusions. Electronically Signed   By: Elgie Collard M.D.   On: 03/03/2018 03:42   Dg Chest Portable 1 View  Result Date: 03/02/2018 CLINICAL DATA:  Shortness of breath with nausea and dry heaves. V-tach. EXAM: PORTABLE CHEST 1 VIEW COMPARISON:  02/24/2018. FINDINGS: Cardiomegaly. Prior CABG. BILATERAL pulmonary opacities most consistent with pulmonary edema although BILATERAL infiltrates are not excluded. No pneumothorax. Old rib fractures. IMPRESSION: Cardiomegaly with BILATERAL pulmonary opacities most consistent with pulmonary edema. Electronically Signed   By: Elsie Stain M.D.   On: 03/02/2018 19:04    Medications:  Prior to Admission:  Medications Prior to Admission  Medication Sig Dispense Refill Last Dose  . carvedilol (COREG) 6.25 MG tablet TAKE 1 TABLET BY MOUTH TWICE DAILY. (Patient taking differently: Take 6.25 mg by mouth 2 (two) times daily with a meal. ) 60 tablet 0 03/02/2018 at 1700  . colchicine 0.6 MG tablet Take 0.6 mg by mouth 2 (two) times daily.   03/01/2018 at Unknown time  . diphenoxylate-atropine (LOMOTIL) 2.5-0.025 MG tablet Take 1 tablet by mouth 4 (four) times daily as needed for diarrhea or loose stools. 45 tablet 0 unknown  . fluticasone (FLONASE) 50 MCG/ACT nasal spray Place 2 sprays into both nostrils daily. 16 g 5 03/01/2018 at Unknown time  . guaiFENesin (ROBITUSSIN) 100 MG/5ML liquid Take 100 mg by mouth 3 (three) times daily as needed for cough.   unknown  . insulin aspart (NOVOLOG) 100 UNIT/ML injection Inject 5 Units into the skin 2 (two) times daily. Inject 5 units subcutaneously twice daily before lunch and supper hold if BX <150 same as human insulin   03/01/2018 at Unknown time  . INVOKANA 100 MG TABS tablet TAKE ONE TABLET BY MOUTH ONCE DAILY BEFORE BREAKFAST. (Patient taking differently: Take 100 mg by mouth daily. ) 30 tablet 0 03/01/2018 at Unknown time  . lactobacillus acidophilus (BACID) TABS tablet Take 1 tablet by  mouth 2 (two) times daily.    03/01/2018 at Unknown time  . LANTUS SOLOSTAR 100 UNIT/ML Solostar Pen INJECT 70 UNITS SUBCUTANEOUSLY AT 10pm. (Patient taking differently: Inject 70 Units into the skin at bedtime. ) 15 mL 3 03/01/2018 at Unknown time  . levocetirizine (XYZAL) 5 MG tablet Take 1 tablet (5 mg total) by mouth every evening. 90 tablet 1 03/01/2018 at Unknown time  . metFORMIN (GLUCOPHAGE-XR) 750 MG 24 hr tablet TAKE 2 TABLETS BY MOUTH EACH MORNING WITH BREAKFAST. (Patient taking differently: Take 750 mg by mouth daily with breakfast. ) 60 tablet 5 03/01/2018 at Unknown time  . methocarbamol (ROBAXIN) 500 MG tablet Take 1 tablet (500 mg total) by mouth every 6 (six) hours as needed for muscle spasms. 60 tablet 1 Past Month at Unknown time  . mupirocin ointment (BACTROBAN) 2 % PLACE A SMALL AMOUT TO TOP OF INCITION AT RIGHT HIP & DRY DRESSING DAILY. (Patient taking differently: Apply 1 application topically daily. Applied to right hip and dry dressing daily) 22 g 0 03/01/2018 at Unknown time  . nitroGLYCERIN (NITROSTAT) 0.4 MG SL tablet Place 1 tablet (0.4  mg total) under the tongue every 5 (five) minutes as needed for chest pain. Max 3 doses. 10 tablet 0 unknown  . ondansetron (ZOFRAN) 4 MG tablet Take 4 mg by mouth every 8 (eight) hours as needed for nausea or vomiting.   unknown  . oxyCODONE (OXY IR/ROXICODONE) 5 MG immediate release tablet Take 1-2 tablets (5-10 mg total) by mouth every 4 (four) hours as needed for moderate pain (pain score 4-6). 30 tablet 0 Past Week at Unknown time  . polyethylene glycol (MIRALAX / GLYCOLAX) packet Take 17 g by mouth daily as needed.   unknown  . potassium chloride SA (K-DUR,KLOR-CON) 20 MEQ tablet Take 20 mEq by mouth daily.   03/01/2018 at Unknown time  . Probiotic Product (RISA-BID PROBIOTIC) TABS Take 1 tablet by mouth 2 (two) times daily.   03/02/2018 at Unknown time  . sacubitril-valsartan (ENTRESTO) 24-26 MG Take 1 tablet by mouth 2 (two) times daily. 60  tablet 6 03/01/2018 at Unknown time  . simvastatin (ZOCOR) 20 MG tablet TAKE 1 TABLET BY MOUTH ONCE DAILY. (Patient taking differently: Take 20 mg by mouth every evening. ) 30 tablet 5 03/01/2018 at Unknown time  . tiZANidine (ZANAFLEX) 4 MG tablet Take 1 tablet (4 mg total) by mouth every 8 (eight) hours as needed for muscle spasms. 60 tablet 0 unknown  . torsemide (DEMADEX) 20 MG tablet Take 20 mg by mouth 2 (two) times daily.   03/02/2018 at 1700  . VIIBRYD 40 MG TABS TAKE 1 TABLET BY MOUTH ONCE DAILY. (Patient taking differently: Take 40 mg by mouth daily. ) 30 tablet 0 03/01/2018 at Unknown time  . XARELTO 20 MG TABS tablet TAKE 1 TABLET BY MOUTH ONCE DAILY. (Patient taking differently: Take 20 mg by mouth every evening. ) 30 tablet 5 03/01/2018 at 1700  . Multiple Vitamins-Minerals (THERA-M) TABS TAKE 1 TABLET BY MOUTH ONCE DAILY. (Patient not taking: Reported on 03/02/2018) 90 tablet 1 Not Taking at Unknown time   Scheduled: . carvedilol  6.25 mg Oral BID WC  . chlorhexidine  15 mL Mouth Rinse BID  . colchicine  0.6 mg Oral BID  . furosemide  40 mg Intravenous Q12H  . insulin aspart  0-9 Units Subcutaneous Q6H  . insulin glargine  70 Units Subcutaneous QHS  . ipratropium-albuterol  3 mL Nebulization Q6H  . mouth rinse  15 mL Mouth Rinse q12n4p  . mupirocin ointment  1 application Topical Daily  . rivaroxaban  20 mg Oral QPM  . sacubitril-valsartan  1 tablet Oral BID  . simvastatin  20 mg Oral QPM  . sodium chloride flush  3 mL Intravenous Q12H  . Vilazodone HCl  40 mg Oral Daily   Continuous: . sodium chloride    . diltiazem (CARDIZEM) infusion 5 mg/hr (03/02/18 2239)   BJY:NWGNFA chloride, metoCLOPramide (REGLAN) injection, ondansetron **OR** ondansetron (ZOFRAN) IV, polyethylene glycol, sodium chloride flush, tiZANidine  Assesment: He has acute hypoxic and hypercapnic respiratory failure.  This is likely from COPD plus aspiration pneumonia.  He had what looked like some pulmonary  edema on admission the chest x-ray done earlier this morning looks more like pneumonia.  He has nausea and vomiting and clearly has aspirated.  He has chronic atrial fib and he is anticoagulated for that.  He has diabetes.  He is known to have heart failure and he is being treated for that he received Lasix 80 mg and he started to diurese. Active Problems:   Type II diabetes mellitus with manifestations (  HCC)   Chronic atrial fibrillation (HCC)   Acute respiratory failure (HCC)    Plan: Agree with plans to intubate and start mechanical ventilation.  Post intubation chest x-ray which I have personally reviewed shows diffuse bilateral infiltrates.  This could be from his aspiration could be from pulmonary edema and both will be treated    LOS: 0 days   Orella Cushman L 03/03/2018, 7:42 AM

## 2018-03-03 NOTE — Progress Notes (Signed)
**Note De-Identified  Obfuscation** Sputum collected and walked to LAB

## 2018-03-03 NOTE — Anesthesia Procedure Notes (Signed)
Procedure Name: Intubation Date/Time: 03/03/2018 7:51 AM Performed by: Lenice Llamas, MD Pre-anesthesia Checklist: Patient identified, Patient being monitored, Timeout performed, Emergency Drugs available and Suction available Patient Re-evaluated:Patient Re-evaluated prior to induction Oxygen Delivery Method: Ambu bag Preoxygenation: Pre-oxygenation with 100% oxygen (pt sats in the mid 70s ) Induction Type: IV induction and Rapid sequence Laryngoscope Size: Mac and 4 Grade View: Grade I Tube type: Oral Tube size: 7.0 mm Number of attempts: 1 Airway Equipment and Method: Stylet Placement Confirmation: ETT inserted through vocal cords under direct vision,  positive ETCO2,  breath sounds checked- equal and bilateral and CO2 detector Secured at: 23 cm Tube secured with: Tape Dental Injury: Teeth and Oropharynx as per pre-operative assessment  Comments: CTSP with Sats ~75 despite 100% O2, given Amidate 70m and Sux 1010mthen RSI  EZ ETT x1 , BBS ETCO2+ Dr. JoWynetta Emeryt bedside throughout , manage per Dr. JoWynetta EmeryXR ordered by him  Call Anesth with any questions

## 2018-03-03 NOTE — Progress Notes (Addendum)
PROGRESS NOTE    Cody Hale  EHU:[id number] DOB: 01/21/1951 DOA: 03/02/2018 PCP: [name], MD     Brief Narrative:  67 year old man admitted from home on 8/26 due to nausea and vomiting.  He has a history of chronic atrial fibrillation on chronic anticoagulation with Xarelto, chronic systolic heart failure with ejection fraction of 30 to 35%, he has a bioprosthetic mitral valve, COPD and hypertension.  He developed nausea and dry heaving after eating lunch on the day of admission.  In the emergency department he was found to have significant shortness of breath and blood glass showed respiratory acidosis with a PCO2 in the 80s.  He was started on BiPAP.  He subsequently vomited and initial plans were made for intubation, however because he stabilized, was alert and oriented and was saturating in the 90s intubation plans were deferred.  A few hours later he further decompensated with repeat emesis into the BiPAP mask and this time did not recover.  Anesthesia was called for intubation this was performed at around 7:52 AM on 8/27.  Repeat chest x-ray shows more of a right lower lobe infiltrate this is consistent with aspiration pneumonia.  He does have also a pulmonary edema pattern he has received Lasix.   Assessment & Plan:   Active Problems:   Type II diabetes mellitus with manifestations (HCC)   Chronic atrial fibrillation (HCC)   Acute respiratory failure (HCC)   Acute respiratory failure with hypoxemia (HCC)   Acute hypoxic and hypercarbic respiratory failure -Presumed due to aspiration pneumonia on top of baseline COPD with acute exacerbation and also possibility of acute on chronic systolic heart failure. -Required intubation and mechanical ventilation on the morning of 8/27. -Appreciate Dr. Luan Pulling input and recommendations. -Agree with Zosyn to cover aspiration pneumonia. -Sputum and blood cultures will be requested. -Postintubation chest x-ray  shows diffuse pulmonary  infiltrates considerations to include aspiration versus pulmonary edema and both are being treated. -In regards to the COPD he is receiving Solu-Medrol and as needed breathing treatments. -For acute on chronic systolic heart failure we will continue Lasix and strive for negative fluid balance.  COPD with acute exacerbation -See above for details.  Aspiration pneumonia. -See above for details.  Acute on chronic systolic heart failure -2D echo from May 2019 shows an ejection fraction of 30 to 35% with dyskinesis of the mid apical anteroseptal myocardium, not technically sufficient to allow evaluation of LV diastolic dysfunction due to atrial fibrillation. -Continue Lasix, see above for further details.  Atrial fibrillation with rapid ventricular response -Remains on IV diltiazem for rate control. -Anticoagulated fully on subcutaneous Lovenox. -Appreciate cardiology following, their input and recommendations.  Insulin-dependent diabetes -Hold metformin, continue subcutaneous Lantus, sliding scale. -Fair control, continue to follow.  Status post right hip arthroplasty -45 days ago, stable.   DVT prophylaxis: Full dose Lovenox Code Status: Full code Family Communication: No family at bedside Disposition Plan: Keep in ICU while intubated  Consultants:   Pulmonary  Cardiology  Procedures:   Intubation on 8/27  Antimicrobials:  Anti-infectives (From admission, onward)   Start     Dose/Rate Route Frequency Ordered Stop   03/03/18 0900  piperacillin-tazobactam (ZOSYN) IVPB 3.375 g     3.375 g 100 mL/hr over 30 Minutes Intravenous  Once 03/03/18 0806 03/03/18 0852       Subjective: Patient currently sedated and intubated.  Objective: Vitals:   03/03/18 0718 03/03/18 0730 03/03/18 0826 03/03/18 0900  BP:  (!) 141/79  (!) 121/103  Pulse: (!) 126 (!) 113  98  Resp: (!) 33 (!) 23  19  Temp: (!) 97.5 F (36.4 C)     TempSrc: Axillary     SpO2: (!) 78% (!) 72% 92% 93%    Weight:      Height:        Intake/Output Summary (Last 24 hours) at 03/03/2018 1015 Last data filed at 03/03/2018 0400 Gross per 24 hour  Intake 88.42 ml  Output 400 ml  Net -311.58 ml   Filed Weights   03/02/18 1831 03/03/18 0502  Weight: 117.9 kg 124.5 kg    Examination:  General exam: Intubated and sedated Respiratory system: Normal respiratory effort, diffuse bilateral crackles Cardiovascular system: Tachycardic, irregular Gastrointestinal system: Abdomen is nondistended, soft and nontender. No organomegaly or masses felt. Normal bowel sounds heard. Central nervous system: Alert and oriented. No focal neurological deficits. Extremities: 1+ pitting edema bilaterally, +pedal pulses Skin: No rashes, lesions or ulcers Psychiatry: Unable to assess given patient's intubated and sedated condition    Data Reviewed: I have personally reviewed following labs and imaging studies  CBC: Recent Labs  Lab 02/24/18 1116 03/02/18 1835 03/02/18 1837 03/03/18 0431  WBC 3.8* 7.0  --  15.4*  NEUTROABS  --  6.2  --   --   HGB 12.9* 14.9 16.0 16.0  HCT 41.6 46.2 47.0 50.7  MCV 100.0 97.5  --  100.0  PLT 164 160  --  950   Basic Metabolic Panel: Recent Labs  Lab 02/24/18 1116 03/02/18 1835 03/02/18 1837 03/03/18 0431  NA 142 141 142 142  K 4.4 4.2 4.2 5.2*  CL 103 105 103 107  CO2 32 26  --  25  GLUCOSE 113* 204* 201* 232*  BUN 15 16 17 22   CREATININE 1.16 0.96 0.90 1.31*  CALCIUM 9.0 9.1  --  9.1  MG  --  2.1  --   --    GFR: Estimated Creatinine Clearance: 71.4 mL/min (A) (by C-G formula based on SCr of 1.31 mg/dL (H)). Liver Function Tests: Recent Labs  Lab 02/24/18 1116 03/02/18 1835 03/03/18 0431  AST 31 28 27   ALT 27 24 22   ALKPHOS 92 90 96  BILITOT 0.8 1.1 1.0  PROT 6.7 7.5 7.3  ALBUMIN 3.6 4.0 3.7   Recent Labs  Lab 03/02/18 1835  LIPASE 23   No results for input(s): AMMONIA in the last 168 hours. Coagulation Profile: No results for input(s):  INR, PROTIME in the last 168 hours. Cardiac Enzymes: Recent Labs  Lab 02/24/18 1116 03/02/18 1835  TROPONINI <0.03 <0.03   BNP (last 3 results) No results for input(s): PROBNP in the last 8760 hours. HbA1C: No results for input(s): HGBA1C in the last 72 hours. CBG: Recent Labs  Lab 03/03/18 0614  GLUCAP 219*   Lipid Profile: No results for input(s): CHOL, HDL, LDLCALC, TRIG, CHOLHDL, LDLDIRECT in the last 72 hours. Thyroid Function Tests: No results for input(s): TSH, T4TOTAL, FREET4, T3FREE, THYROIDAB in the last 72 hours. Anemia Panel: No results for input(s): VITAMINB12, FOLATE, FERRITIN, TIBC, IRON, RETICCTPCT in the last 72 hours. Urine analysis:    Component Value Date/Time   COLORURINE YELLOW 03/03/2018 0430   APPEARANCEUR HAZY (A) 03/03/2018 0430   APPEARANCEUR Clear 07/23/2017   LABSPEC 1.014 03/03/2018 0430   PHURINE 5.0 03/03/2018 0430   GLUCOSEU >=500 (A) 03/03/2018 0430   GLUCOSEU 500 (A) 07/14/2017 1053   HGBUR NEGATIVE 03/03/2018 0430   BILIRUBINUR NEGATIVE 03/03/2018 0430  BILIRUBINUR Negative 07/23/2017   KETONESUR 5 (A) 03/03/2018 0430   PROTEINUR 100 (A) 03/03/2018 0430   UROBILINOGEN 0.2 07/14/2017 1053   NITRITE NEGATIVE 03/03/2018 0430   LEUKOCYTESUR NEGATIVE 03/03/2018 0430   LEUKOCYTESUR Negative (A) 07/23/2017   Sepsis Labs: @LABRCNTIP (procalcitonin:4,lacticidven:4)  ) Recent Results (from the past 240 hour(s))  MRSA PCR Screening     Status: None   Collection Time: 03/03/18  4:02 AM  Result Value Ref Range Status   MRSA by PCR NEGATIVE NEGATIVE Final    Comment:        The GeneXpert MRSA Assay (FDA approved for NASAL specimens only), is one component of a comprehensive MRSA colonization surveillance program. It is not intended to diagnose MRSA infection nor to guide or monitor treatment for MRSA infections. Performed at [facility], [address] [place], [place] [address]          Radiology Studies: Dg Chest  Portable 1 View  Result Date: 03/03/2018 CLINICAL DATA:  67 year old male with shortness of breath. EXAM: PORTABLE CHEST 1 VIEW COMPARISON:  Chest radiograph dated 03/02/2018 FINDINGS: There is shallow inspiration with bibasilar atelectasis versus infiltrate. Probable small bilateral pleural effusions. The right costophrenic angle has been excluded from the image. Overall there is poor inspiratory effort compared to the prior radiograph with increased bibasilar densities. No pneumothorax. Stable cardiomegaly. Median sternotomy wires. No acute osseous pathology. Old right rib fractures. IMPRESSION: Shallow inspiration with bibasilar atelectasis versus infiltrate and probable small bilateral pleural effusions. Electronically Signed   By: Anner Crete M.D.   On: 03/03/2018 03:42   Dg Chest Portable 1 View  Result Date: 03/02/2018 CLINICAL DATA:  Shortness of breath with nausea and dry heaves. V-tach. EXAM: PORTABLE CHEST 1 VIEW COMPARISON:  02/24/2018. FINDINGS: Cardiomegaly. Prior CABG. BILATERAL pulmonary opacities most consistent with pulmonary edema although BILATERAL infiltrates are not excluded. No pneumothorax. Old rib fractures. IMPRESSION: Cardiomegaly with BILATERAL pulmonary opacities most consistent with pulmonary edema. Electronically Signed   By: Staci Righter M.D.   On: 03/02/2018 19:04   Dg Chest Port 1v Same Day  Result Date: 03/03/2018 CLINICAL DATA:  Post intubation radiograph. EXAM: PORTABLE CHEST 1 VIEW COMPARISON:  Portable chest x-ray of today's date at 3:15 a.m. FINDINGS: The endotracheal tube tip projects approximately 6.5 cm above the carina. The tip is at the level of the inferior margin of the clavicular heads. Widespread interstitial densities are present with confluent densities at the lung bases. Small bilateral pleural effusions are present. The cardiac silhouette remains enlarged and the pulmonary vascularity engorged. The visualized sternal wires are intact. IMPRESSION:  Interval intubation of the trachea. Advancement of the endotracheal tube by approximately 2-3 cm would be useful. Widespread interstitial and airspace opacities most compatible with pulmonary edema. Electronically Signed   By: David  Martinique M.D.   On: 03/03/2018 08:33        Scheduled Meds: . chlorhexidine  15 mL Mouth Rinse BID  . chlorhexidine gluconate (MEDLINE KIT)  15 mL Mouth Rinse BID  . colchicine  0.6 mg Oral BID  . enoxaparin (LOVENOX) injection  120 mg Subcutaneous Q12H  . furosemide  40 mg Intravenous Q12H  . insulin aspart  0-9 Units Subcutaneous Q6H  . insulin glargine  70 Units Subcutaneous QHS  . ipratropium-albuterol  3 mL Nebulization Q6H  . mouth rinse  15 mL Mouth Rinse q12n4p  . mouth rinse  15 mL Mouth Rinse 10 times per day  . methylPREDNISolone (SOLU-MEDROL) injection  40 mg Intravenous Q12H  .  mupirocin ointment  1 application Topical Daily  . sodium chloride flush  3 mL Intravenous Q12H   Continuous Infusions: . sodium chloride    . diltiazem (CARDIZEM) infusion 5 mg/hr (03/02/18 2239)  . famotidine (PEPCID) IV       LOS: 0 days    The patient is critically ill with multiple organ systems failure and requires high complexity decision making for assessment and support, frequent evaluation and titration of therapies, application of advanced monitoring technologies and extensive interpretation of multiple databases.  Critical care time - 95 minutes        Lelon Frohlich, MD [facility] Pager [phone]  If 7PM-7AM, please contact night-coverage [email or web address] Password [facility] 03/03/2018, 10:15 AM

## 2018-03-03 NOTE — Progress Notes (Signed)
Pharmacy Antibiotic Note  Cody Hale is a 67 y.o. male admitted on 03/02/2018 with possible aspiration pneumonia.  Pharmacy has been consulted for .Zosyn  dosing.  Plan:  Give Zosyn 3.376g IV over 30 minutes x1 dose, then Start Zosyn 3.375g IV q8h (4-hr infusion) Pharmacy will continue to monitor renal function,, cultures and patient progress.   Height: 5\' 9"  (175.3 cm) Weight: 274 lb 7.6 oz (124.5 kg) IBW/kg (Calculated) : 70.7  Temp (24hrs), Avg:97.3 F (36.3 C), Min:97.1 F (36.2 C), Max:97.6 F (36.4 C)  Recent Labs  Lab 02/24/18 1116 03/02/18 1835 03/02/18 1837 03/02/18 1856 03/02/18 2120 03/03/18 0431  WBC 3.8* 7.0  --   --   --  15.4*  CREATININE 1.16 0.96 0.90  --   --  1.31*  LATICACIDVEN  --   --   --  0.9 1.6  --     Estimated Creatinine Clearance: 71.4 mL/min (A) (by C-G formula based on SCr of 1.31 mg/dL (H)).    Allergies  Allergen Reactions  . Daptomycin Rash  . Lisinopril Cough  . Tape Rash and Other (See Comments)    Adhesive Tape-Burn skin.    Antimicrobials this admission: 8/27 Zosyn>>    Microbiology results: 8/27 BCx2: ordered 8/27 Sputum: ordered 8/27 MRSA PCR: negative  Thank you for allowing pharmacy to be a part of this patient's care.  Tama High 03/03/2018 10:36 AM

## 2018-03-03 NOTE — Progress Notes (Signed)
K. Schorr,NP paged d/t pt having a temperature of 100.3 axillary and no tylenol ordered. Waiting for orders/call back. Will continue to monitor pt

## 2018-03-03 NOTE — Clinical Social Work Note (Signed)
Clinical Social Work Assessment  Patient Details  Name: Cody Hale MRN: [id number] Date of Birth: [birth year]  Date of referral:  03/03/18               Reason for consult:  Discharge Planning                Permission sought to share information with:    Permission granted to share information::     Name::        Agency::     Relationship::     Contact Information:     Housing/Transportation Living arrangements for the past 2 months:  Assisted Living Facility Source of Information:  Facility Patient Interpreter Needed:  None Criminal Activity/Legal Involvement Pertinent to Current Situation/Hospitalization:  No - Comment as needed Significant Relationships:  Adult Children Lives with:  Facility Resident Do you feel safe going back to the place where you live?  Yes Need for family participation in patient care:  No (Coment)  Care giving concerns: Pt admitted from an ALF.   Social Worker assessment / plan: Pt is a 67 year old male admitted from [facility] ALF. Reviewed pt's record today. Pt currently intubated in the ICU. Pt unable to participate in assessment. Spoke with Dondra Spry at [facility] to update. Per Dondra Spry, pt recently had surgery and he has been using a walker for ambulation at the ALF. He was receiving HH PT prior to admission. Pt is on a diabetic diet. He does receive assistance with ADL's at the facility. He has been alert and oriented x4 at baseline. Pt's daughter is his family support.   Will follow and assist with support and dc planning as needed.  Employment status:  Retired Health and safety inspector:  Medicare PT Recommendations:  Not assessed at this time Information / Referral to community resources:     Patient/Family's Response to care: Pt accepting of care.  Patient/Family's Understanding of and Emotional Response to Diagnosis, Current Treatment, and Prognosis: At this time, pt is unable to receive information about diagnosis and treatment recommendations.  Family receiving information. No emotional distress noted.   Emotional Assessment Appearance:  Appears stated age Attitude/Demeanor/Rapport:  Unable to Assess Affect (typically observed):  Unable to Assess Orientation:  Oriented to Self Alcohol / Substance use:    Psych involvement (Current and /or in the community):     Discharge Needs  Concerns to be addressed:  Discharge Planning Concerns Readmission within the last 30 days:  No Current discharge risk:  None Barriers to Discharge:  Continued Medical Work up   [employer], LCSW 03/03/2018, 10:56 AM

## 2018-03-03 NOTE — Progress Notes (Signed)
Called by Dr. Manus Gunning, that patient vomited and might need to be intubated.  Went and examined the patient he was on 100% nonrebreather mask, O2 sats 92%.  Patient was alert and oriented denied any shortness of breath.  Chest x-ray obtained showed pulmonary edema.  Deferred intubation at this time, patient put back on BiPAP.  Patient denies any abdominal pain.  Vomiting has resolved. On exam-bilateral rhonchi auscultated Abdomen-soft, nontender, no organomegaly  Plan Insert Foley catheter-this patient had not urinated since given Lasix 80 mg IV around 7:30 PM yesterday Continue BiPAP We will closely monitor in the stepdown unit  Critical care time spent 40 minutes
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
## 2018-03-03 NOTE — ED Notes (Signed)
Called ICU, spoke with Nehemiah Settle, RN to explain delay in transport.   Informed ready to transport pt at this time. Nausea and vomiting resolved, pt is back on bi-pap

## 2018-03-03 NOTE — Progress Notes (Signed)
Pt ETT found secured at 26cm. Reviewed notes, chest xray and intubation note and there is no mention of ETT being moved or needing to be moved. ETT retracted to 23cm per original intubation note. Bilateral, equal breath sounds. Good tidal volumes and chest rise. Positive color change on CO2 detector. Revonda Standard, RN notified also
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
## 2018-03-03 NOTE — ED Notes (Signed)
RT at bedside placing pt back on bipap.  

## 2018-03-03 NOTE — ED Provider Notes (Addendum)
Late entry: Events occurred approximately 2:45 am.  Patient waiting in the ED for admission.  Patient vomited into his BiPAP mask and is now in respiratory distress.  Oxygen sats 88% on nonrebreather.  Coarse rhonchi throughout.  Patient continues to vomit.  Plan for intubation.  Discussed with Dr. Sharl Ma who agrees.  After receiving Zofran, vomiting has subsided and patient's breathing has stabilized.  He is tachypneic but maintaining oxygen saturations on nasal cannula.  Denies chest pain.  Coarse rhonchi bilaterally.  Dr. Sharl Ma at bedside. Chest x-ray appears unchanged pulmonary edema.  Patient states he is feeling better and no longer having nausea or vomiting.  Oxygenation has stabilized on nasal cannula and his mental status is good.  Discussed with Dr. Sharl Ma will hold intubation at this time. Dr. Sharl Ma assumes care in ICU.  CRITICAL CARE Performed by: Glynn Octave Total critical care time: 31 minutes Critical care time was exclusive of separately billable procedures and treating other patients. Critical care was necessary to treat or prevent imminent or life-threatening deterioration. Critical care was time spent personally by me on the following activities: development of treatment plan with patient and/or surrogate as well as nursing, discussions with consultants, evaluation of patient's response to treatment, examination of patient, obtaining history from patient or surrogate, ordering and performing treatments and interventions, ordering and review of laboratory studies, ordering and review of radiographic studies, pulse oximetry and re-evaluation of patient's condition.        Glynn Octave, MD 03/03/18 0258    Glynn Octave, MD 03/03/18 [phone]

## 2018-03-03 NOTE — ED Notes (Signed)
Pt vomited while on bi-pap- RT at bedside suctioning. Dr Manus Gunning and DR Sharl Ma at bedside. Pt placed on nonrebreathing at this time.

## 2018-03-04 ENCOUNTER — Inpatient Hospital Stay (HOSPITAL_COMMUNITY): Payer: Medicare Other

## 2018-03-04 DIAGNOSIS — I482 Chronic atrial fibrillation: Secondary | ICD-10-CM

## 2018-03-04 DIAGNOSIS — I5043 Acute on chronic combined systolic (congestive) and diastolic (congestive) heart failure: Secondary | ICD-10-CM

## 2018-03-04 DIAGNOSIS — J9601 Acute respiratory failure with hypoxia: Secondary | ICD-10-CM

## 2018-03-04 LAB — BLOOD GAS, ARTERIAL
Acid-Base Excess: 0.1 mmol/L (ref 0.0–2.0)
Bicarbonate: 23.8 mmol/L (ref 20.0–28.0)
FIO2: 1
MECHVT: 620 mL
O2 Saturation: 97.8 %
PEEP: 8 cmH2O
Patient temperature: 37
RATE: 16 resp/min
Sample type: 277331
pCO2 arterial: 47.9 mmHg (ref 32.0–48.0)
pH, Arterial: 7.34 — ABNORMAL LOW (ref 7.350–7.450)
pO2, Arterial: 100 mmHg (ref 83.0–108.0)

## 2018-03-04 LAB — CBC
HCT: 42 % (ref 39.0–52.0)
Hemoglobin: 13.4 g/dL (ref 13.0–17.0)
MCH: 31.2 pg (ref 26.0–34.0)
MCHC: 31.9 g/dL (ref 30.0–36.0)
MCV: 97.7 fL (ref 78.0–100.0)
Platelets: 146 10*3/uL — ABNORMAL LOW (ref 150–400)
RBC: 4.3 MIL/uL (ref 4.22–5.81)
RDW: 15.1 % (ref 11.5–15.5)
WBC: 12.7 10*3/uL — ABNORMAL HIGH (ref 4.0–10.5)

## 2018-03-04 LAB — BASIC METABOLIC PANEL
Anion gap: 11 (ref 5–15)
BUN: 39 mg/dL — ABNORMAL HIGH (ref 8–23)
CO2: 27 mmol/L (ref 22–32)
Calcium: 9 mg/dL (ref 8.9–10.3)
Chloride: 106 mmol/L (ref 98–111)
Creatinine, Ser: 1.89 mg/dL — ABNORMAL HIGH (ref 0.61–1.24)
GFR calc Af Amer: 41 mL/min — ABNORMAL LOW (ref >60)
GFR calc non Af Amer: 35 mL/min — ABNORMAL LOW (ref >60)
Glucose, Bld: 221 mg/dL — ABNORMAL HIGH (ref 70–99)
Potassium: 4.5 mmol/L (ref 3.5–5.1)
Sodium: 144 mmol/L (ref 135–145)

## 2018-03-04 LAB — GLUCOSE, CAPILLARY
Glucose-Capillary: 175 mg/dL — ABNORMAL HIGH (ref 70–99)
Glucose-Capillary: 186 mg/dL — ABNORMAL HIGH (ref 70–99)
Glucose-Capillary: 188 mg/dL — ABNORMAL HIGH (ref 70–99)
Glucose-Capillary: 210 mg/dL — ABNORMAL HIGH (ref 70–99)
Glucose-Capillary: 221 mg/dL — ABNORMAL HIGH (ref 70–99)

## 2018-03-04 LAB — HIV ANTIBODY (ROUTINE TESTING W REFLEX): HIV Screen 4th Generation wRfx: NONREACTIVE

## 2018-03-04 MED ORDER — INSULIN ASPART 100 UNIT/ML ~~LOC~~ SOLN
0.0000 [IU] | SUBCUTANEOUS | Status: DC
  Administered 2018-03-04 (×3): 5 [IU] via SUBCUTANEOUS
  Administered 2018-03-05: 3 [IU] via SUBCUTANEOUS
  Administered 2018-03-05: 5 [IU] via SUBCUTANEOUS
  Administered 2018-03-05 (×3): 3 [IU] via SUBCUTANEOUS
  Administered 2018-03-05: 8 [IU] via SUBCUTANEOUS
  Administered 2018-03-06: 2 [IU] via SUBCUTANEOUS
  Administered 2018-03-06 (×3): 3 [IU] via SUBCUTANEOUS
  Administered 2018-03-06 – 2018-03-07 (×3): 2 [IU] via SUBCUTANEOUS
  Administered 2018-03-07 (×2): 3 [IU] via SUBCUTANEOUS
  Administered 2018-03-07 – 2018-03-08 (×3): 2 [IU] via SUBCUTANEOUS
  Administered 2018-03-08: 3 [IU] via SUBCUTANEOUS
  Administered 2018-03-08: 2 [IU] via SUBCUTANEOUS

## 2018-03-04 NOTE — Addendum Note (Signed)
Addendum  created 03/04/18 0920 by Franco Nones, CRNA   Charge Capture section accepted, Visit diagnoses modified

## 2018-03-04 NOTE — Progress Notes (Signed)
Pt asking this RN when he will be able to get off ventilator- adv pt he needs to wait for the doctor to come see him this morning. Pt shook his head and verbalized understanding.

## 2018-03-04 NOTE — Progress Notes (Signed)
RN responded to vent high alarm setting. When attempting to suction pt the inline catheter would not advance. Notified RT. RT eventually advised despite best efforts that we needed chest xray to see if tube was in place. Notified Dr. Sherryll Burger that portable chest x-ray was needed stat. Chest XRAY done while waiting for results pt desat to 60%. RT extubated pt while this RN called AC to page anesthesiology to re-intubate pt and crash cart was brought to room. Upon extubation pt began to respond well to bagging and o2 sats 90%. Pt placed on non-rebreather and A0x4 and able to follow commands and communicate needs. VSS. PT HR 100s. Continues on Cardizem gtt. Dr. Sherryll Burger made aware of pt's status.

## 2018-03-04 NOTE — Progress Notes (Signed)
PROGRESS NOTE    Cody Hale  WIO:[id number] DOB: 1950-12-06 DOA: 03/02/2018 PCP: [name], MD   Brief Narrative:   67 year old man admitted from home on 8/26 due to nausea and vomiting.  He has a history of chronic atrial fibrillation on chronic anticoagulation with Xarelto, chronic systolic heart failure with ejection fraction of 30 to 35%, he has a bioprosthetic mitral valve, COPD and hypertension.  He developed nausea and dry heaving after eating lunch on the day of admission.  In the emergency department he was found to have significant shortness of breath and blood glass showed respiratory acidosis with a PCO2 in the 80s.  He was started on BiPAP.  He subsequently vomited and initial plans were made for intubation, however because he stabilized, was alert and oriented and was saturating in the 90s intubation plans were deferred.  A few hours later he further decompensated with repeat emesis into the BiPAP mask and this time did not recover.  Anesthesia was called for intubation this was performed at around 7:52 AM on 8/27.  Repeat chest x-ray shows some improvement.  He had mild pulmonary edema for which she has received Lasix with good diuresis noted.  He is now developing some AKI and Lasix has been discontinued.  Assessment & Plan:   Principal Problem:   Acute respiratory failure with hypoxia and hypercapnia (HCC) Active Problems:   Type II diabetes mellitus with manifestations (HCC)   Chronic atrial fibrillation (HCC)   Acute respiratory failure ([place])   1. Acute hypoxemic and hypercarbic respiratory failure likely secondary to aspiration pneumonia, acute COPD exacerbation, and acute on chronic systolic heart failure.  This currently remained stable with some improvement in the pulmonary edema component.  Appreciate pulmonology recommendations.  Hold off any further IV Lasix at this time as creatinine has trended up.  Sputum with some gram-positive cocci noted.  Continue  Solu-Medrol and breathing treatments.  Continue IV Zosyn. 2. AKI.  Secondary to aggressive diuresis.  Holding Lasix today and will continue to monitor laboratory panel in a.m.  Avoid nephrotoxic agents. 3. Atrial fibrillation with RVR.  Maintain on IV diltiazem for rate control as he is currently n.p.o.  Full anticoagulation on Lovenox.  Appreciate cardiology recommendations. 4. Acute on chronic systolic heart failure.  Noted to have LVEF of 30 to 35% with dyskinesis on 2D echocardiogram from 11/2017.  Holding Lasix today.  Maintain negative fluid balance. 5. Insulin-dependent diabetes with hyperglycemia.  Will increase sliding scale today as he appears to be having some steroid-induced hyperglycemia.  Continue to monitor. 6. Status post right hip arthroplasty.  This was approximately 45 days ago and is currently stable.   DVT prophylaxis: Full dose Lovenox Code Status: Full Family Communication: No family at bedside Disposition Plan: Keep in ICU and wean off ventilator   Consultants:   Pulmonology  Cardiology  Procedures:   Intubation on 8/27  Antimicrobials:   Zosyn 8/27->   Subjective: Patient seen and evaluated today with no new acute concerns overnight. He is sedated and intubated.  Objective: Vitals:   03/04/18 0645 03/04/18 0746 03/04/18 0808 03/04/18 1133  BP: 117/69     Pulse: 82     Resp: 18     Temp:   98 F (36.7 C) 98 F (36.7 C)  TempSrc:   Axillary Axillary  SpO2: 98% 99%    Weight:      Height:        Intake/Output Summary (Last 24 hours) at 03/04/2018 1135  Last data filed at 03/04/2018 0436 Gross per 24 hour  Intake 177.3 ml  Output 1350 ml  Net -1172.7 ml   Filed Weights   03/02/18 1831 03/03/18 0502 03/04/18 0430  Weight: 117.9 kg 124.5 kg 119 kg    Examination:  General exam: Appears calm and comfortable  Respiratory system: Clear to auscultation. Respiratory effort normal. Cardiovascular system: S1 & S2 heard, RRR. No JVD, murmurs,  rubs, gallops or clicks. No pedal edema. Gastrointestinal system: Abdomen is nondistended, soft and nontender. No organomegaly or masses felt. Normal bowel sounds heard. Central nervous system: Alert and oriented. No focal neurological deficits. Extremities: Symmetric 5 x 5 power. Skin: No rashes, lesions or ulcers Psychiatry: Judgement and insight appear normal. Mood & affect appropriate.     Data Reviewed: I have personally reviewed following labs and imaging studies  CBC: Recent Labs  Lab 03/02/18 1835 03/02/18 1837 03/03/18 0431 03/04/18 0545  WBC 7.0  --  15.4* 12.7*  NEUTROABS 6.2  --   --   --   HGB 14.9 16.0 16.0 13.4  HCT 46.2 47.0 50.7 42.0  MCV 97.5  --  100.0 97.7  PLT 160  --  184 646*   Basic Metabolic Panel: Recent Labs  Lab 03/02/18 1835 03/02/18 1837 03/03/18 0431 03/04/18 0545  NA 141 142 142 144  K 4.2 4.2 5.2* 4.5  CL 105 103 107 106  CO2 26  --  25 27  GLUCOSE 204* 201* 232* 221*  BUN _0 39*  CREATININE 0.96 0.90 1.31* 1.89*  CALCIUM 9.1  --  9.1 9.0  MG 2.1  --   --   --    GFR: Estimated Creatinine Clearance: 48.3 mL/min (A) (by C-G formula based on SCr of 1.89 mg/dL (H)). Liver Function Tests: Recent Labs  Lab 03/02/18 1835 03/03/18 0431  AST 28 27  ALT 24 22  ALKPHOS 90 96  BILITOT 1.1 1.0  PROT 7.5 7.3  ALBUMIN 4.0 3.7   Recent Labs  Lab 03/02/18 1835  LIPASE 23   No results for input(s): AMMONIA in the last 168 hours. Coagulation Profile: No results for input(s): INR, PROTIME in the last 168 hours. Cardiac Enzymes: Recent Labs  Lab 03/02/18 1835  TROPONINI <0.03   BNP (last 3 results) No results for input(s): PROBNP in the last 8760 hours. HbA1C: Recent Labs    03/03/18 0431  HGBA1C 6.0*   CBG: Recent Labs  Lab 03/03/18 1958 03/04/18 0009 03/04/18 0414 03/04/18 0758 03/04/18 1126  GLUCAP 184* 188* 186* 175* 210*   Lipid Profile: No results for input(s): CHOL, HDL, LDLCALC, TRIG, CHOLHDL,  LDLDIRECT in the last 72 hours. Thyroid Function Tests: No results for input(s): TSH, T4TOTAL, FREET4, T3FREE, THYROIDAB in the last 72 hours. Anemia Panel: No results for input(s): VITAMINB12, FOLATE, FERRITIN, TIBC, IRON, RETICCTPCT in the last 72 hours. Sepsis Labs: Recent Labs  Lab 03/02/18 1856 03/02/18 2120  LATICACIDVEN 0.9 1.6    Recent Results (from the past 240 hour(s))  MRSA PCR Screening     Status: None   Collection Time: 03/03/18  4:02 AM  Result Value Ref Range Status   MRSA by PCR NEGATIVE NEGATIVE Final    Comment:        The GeneXpert MRSA Assay (FDA approved for NASAL specimens only), is one component of a comprehensive MRSA colonization surveillance program. It is not intended to diagnose MRSA infection nor to guide or monitor treatment for MRSA infections. Performed at [facility], [address] [place], [place] [address]   Culture, blood (Routine X 2) w Reflex to ID Panel     Status: None (Preliminary result)   Collection Time: 03/03/18 10:43 AM  Result Value Ref Range Status   Specimen Description BLOOD LEFT WRIST  Final   Special Requests   Final    BOTTLES DRAWN AEROBIC AND ANAEROBIC Blood Culture adequate volume   Culture   Final    NO GROWTH < 24 HOURS Performed at [facility], [address] [place], [place] [address]    Report Status PENDING  Incomplete  Culture, blood (Routine X 2) w Reflex to ID Panel     Status: None (Preliminary result)   Collection Time: 03/03/18 10:51 AM  Result Value Ref Range Status   Specimen Description BLOOD LEFT HAND  Final   Special Requests   Final    BOTTLES DRAWN AEROBIC AND ANAEROBIC Blood Culture adequate volume   Culture   Final    NO GROWTH < 24 HOURS Performed at [facility], [address], [place], [place] [address]    Report Status PENDING  Incomplete  Culture, respiratory     Status: None (Preliminary result)   Collection Time: 03/03/18 11:48 AM  Result Value Ref Range Status   Specimen  Description   Final    TRACHEAL ASPIRATE Performed at [facility], [address], [place], [place] [address]    Special Requests   Final    NONE Performed at [facility], [address], [place], [place] [address]    Gram Stain   Final    ABUNDANT WBC PRESENT, PREDOMINANTLY PMN RARE GRAM POSITIVE COCCI    Culture   Final    CULTURE REINCUBATED FOR BETTER GROWTH Performed at [facility], [place] [address], [place], [place] [address]    Report Status PENDING  Incomplete         Radiology Studies: Portable Chest Xray  Result Date: 03/04/2018 CLINICAL DATA:  Respiratory failure. EXAM: PORTABLE CHEST 1 VIEW COMPARISON:  Radiograph of March 03, 2018. FINDINGS: Stable cardiomegaly. Endotracheal and nasogastric tubes are unchanged in position. No pneumothorax is noted. Stable bibasilar opacities are noted concerning for edema or infiltrates. Small bilateral pleural effusions cannot be excluded. Bony thorax is unremarkable. IMPRESSION: Stable support apparatus. Stable bibasilar opacities as described above. Electronically Signed   By: Marijo Conception, M.D.   On: 03/04/2018 07:31   Dg Chest Portable 1 View  Result Date: 03/03/2018 CLINICAL DATA:  67 year old male with shortness of breath. EXAM: PORTABLE CHEST 1 VIEW COMPARISON:  Chest radiograph dated 03/02/2018 FINDINGS: There is shallow inspiration with bibasilar atelectasis versus infiltrate. Probable small bilateral pleural effusions. The right costophrenic angle has been excluded from the image. Overall there is poor inspiratory effort compared to the prior radiograph with increased bibasilar densities. No pneumothorax. Stable cardiomegaly. Median sternotomy wires. No acute osseous pathology. Old right rib fractures. IMPRESSION: Shallow inspiration with bibasilar atelectasis versus infiltrate and probable small bilateral pleural effusions. Electronically Signed   By: Anner Crete M.D.   On: 03/03/2018 03:42   Dg Chest Portable 1  View  Result Date: 03/02/2018 CLINICAL DATA:  Shortness of breath with nausea and dry heaves. V-tach. EXAM: PORTABLE CHEST 1 VIEW COMPARISON:  02/24/2018. FINDINGS: Cardiomegaly. Prior CABG. BILATERAL pulmonary opacities most consistent with pulmonary edema although BILATERAL infiltrates are not excluded. No pneumothorax. Old rib fractures. IMPRESSION: Cardiomegaly with BILATERAL pulmonary opacities most consistent with pulmonary edema. Electronically Signed  By: Staci Righter M.D.   On: 03/02/2018 19:04   Dg Chest Port 1v Same Day  Result Date: 03/03/2018 CLINICAL DATA:  Nasogastric tube placement EXAM: PORTABLE CHEST 1 VIEW COMPARISON:  Portable exam 1355 hours compared 03/03/2018 at 0806 hours FINDINGS: Nasogastric tube coiled in stomach with tip extending below extent of film. Tip of endotracheal tube remains in satisfactory position 5.2 cm above the carina. Enlargement of cardiac silhouette post median sternotomy. Bibasilar infiltrates. No pleural effusion or pneumothorax. IMPRESSION: Nasogastric tube extends into stomach, tip beyond a fear your extent of exam. Enlargement of cardiac silhouette with persistent bibasilar infiltrates. Electronically Signed   By: Lavonia Dana M.D.   On: 03/03/2018 14:14   Dg Chest Port 1v Same Day  Result Date: 03/03/2018 CLINICAL DATA:  Post intubation radiograph. EXAM: PORTABLE CHEST 1 VIEW COMPARISON:  Portable chest x-ray of today's date at 3:15 a.m. FINDINGS: The endotracheal tube tip projects approximately 6.5 cm above the carina. The tip is at the level of the inferior margin of the clavicular heads. Widespread interstitial densities are present with confluent densities at the lung bases. Small bilateral pleural effusions are present. The cardiac silhouette remains enlarged and the pulmonary vascularity engorged. The visualized sternal wires are intact. IMPRESSION: Interval intubation of the trachea. Advancement of the endotracheal tube by approximately 2-3 cm  would be useful. Widespread interstitial and airspace opacities most compatible with pulmonary edema. Electronically Signed   By: David  Martinique M.D.   On: 03/03/2018 08:33        Scheduled Meds: . chlorhexidine gluconate (MEDLINE KIT)  15 mL Mouth Rinse BID  . colchicine  0.6 mg Oral BID  . enoxaparin (LOVENOX) injection  120 mg Subcutaneous Q12H  . insulin aspart  0-9 Units Subcutaneous Q4H  . insulin glargine  70 Units Subcutaneous QHS  . ipratropium-albuterol  3 mL Nebulization Q6H  . mouth rinse  15 mL Mouth Rinse 10 times per day  . methylPREDNISolone (SOLU-MEDROL) injection  40 mg Intravenous Q12H  . mupirocin ointment  1 application Topical Daily  . sodium chloride flush  3 mL Intravenous Q12H   Continuous Infusions: . sodium chloride    . diltiazem (CARDIZEM) infusion 5 mg/hr (03/04/18 0120)  . famotidine (PEPCID) IV 20 mg (03/04/18 0821)  . piperacillin-tazobactam (ZOSYN)  IV 3.375 g (03/04/18 0818)     LOS: 1 day    Time spent: 30 minutes    Gussie Murton Darleen Crocker, DO [facility] Pager [phone]  If 7PM-7AM, please contact night-coverage [email or web address] Password [facility] 03/04/2018, 11:35 AM

## 2018-03-04 NOTE — Progress Notes (Signed)
Progress Note  Patient Name: Cody Hale Date of Encounter: 03/04/2018  Primary Cardiologist: Dr. Kate Sable  Subjective   Patient is sedated on the ventilator.  Inpatient Medications    Scheduled Meds: . chlorhexidine gluconate (MEDLINE KIT)  15 mL Mouth Rinse BID  . colchicine  0.6 mg Oral BID  . enoxaparin (LOVENOX) injection  120 mg Subcutaneous Q12H  . furosemide  40 mg Intravenous Q12H  . insulin aspart  0-9 Units Subcutaneous Q4H  . insulin glargine  70 Units Subcutaneous QHS  . ipratropium-albuterol  3 mL Nebulization Q6H  . mouth rinse  15 mL Mouth Rinse 10 times per day  . methylPREDNISolone (SOLU-MEDROL) injection  40 mg Intravenous Q12H  . mupirocin ointment  1 application Topical Daily  . sodium chloride flush  3 mL Intravenous Q12H   Continuous Infusions: . sodium chloride    . diltiazem (CARDIZEM) infusion 5 mg/hr (03/04/18 0120)  . famotidine (PEPCID) IV 20 mg (03/04/18 0821)  . piperacillin-tazobactam (ZOSYN)  IV 3.375 g (03/04/18 0818)   PRN Meds: sodium chloride, acetaminophen, fentaNYL (SUBLIMAZE) injection, fentaNYL (SUBLIMAZE) injection, metoCLOPramide (REGLAN) injection, midazolam, midazolam, ondansetron **OR** ondansetron (ZOFRAN) IV, polyethylene glycol, sodium chloride flush, tiZANidine   Vital Signs    Vitals:   03/04/18 0630 03/04/18 0645 03/04/18 0746 03/04/18 0808  BP: 107/69 117/69    Pulse: 83 82    Resp: 18 18    Temp:    98 F (36.7 C)  TempSrc:    Axillary  SpO2: 96% 98% 99%   Weight:      Height:        Intake/Output Summary (Last 24 hours) at 03/04/2018 0939 Last data filed at 03/04/2018 0436 Gross per 24 hour  Intake 177.3 ml  Output 1350 ml  Net -1172.7 ml   Filed Weights   03/02/18 1831 03/03/18 0502 03/04/18 0430  Weight: 117.9 kg 124.5 kg 119 kg    Telemetry    Atrial fibrillation.  Personally reviewed.  Physical Exam   GEN:  Obese male, sedated on ventilator.   Neck: No obvious JVD. Cardiac:   Indistinct PMI, irregularly irregular, no gallop.  Respiratory:  Few rhonchi.  Nonlabored breathing. GI: Soft, nontender, bowel sounds present. MS:  1-2+ lower leg edema; No deformity. Neuro:   Patient sedated. Psych: Patient sedated.  Labs    Chemistry Recent Labs  Lab 03/02/18 1835 03/02/18 1837 03/03/18 0431 03/04/18 0545  NA 141 142 142 144  K 4.2 4.2 5.2* 4.5  CL 105 103 107 106  CO2 26  --  25 27  GLUCOSE 204* 201* 232* 221*  BUN _0 39*  CREATININE 0.96 0.90 1.31* 1.89*  CALCIUM 9.1  --  9.1 9.0  PROT 7.5  --  7.3  --   ALBUMIN 4.0  --  3.7  --   AST 28  --  27  --   ALT 24  --  22  --   ALKPHOS 90  --  96  --   BILITOT 1.1  --  1.0  --   GFRNONAA >60  --  55* 35*  GFRAA >60  --  >60 41*  ANIONGAP 10  --  10 11     Hematology Recent Labs  Lab 03/02/18 1835 03/02/18 1837 03/03/18 0431 03/04/18 0545  WBC 7.0  --  15.4* 12.7*  RBC 4.74  --  5.07 4.30  HGB 14.9 16.0 16.0 13.4  HCT 46.2 47.0 50.7 42.0  MCV  97.5  --  100.0 97.7  MCH 31.4  --  31.6 31.2  MCHC 32.3  --  31.6 31.9  RDW 14.6  --  14.9 15.1  PLT 160  --  184 146*    Cardiac Enzymes Recent Labs  Lab 03/02/18 1835  TROPONINI <0.03    Recent Labs  Lab 03/02/18 1836  TROPIPOC 0.01     BNP Recent Labs  Lab 03/02/18 1835  BNP 435.0*     Radiology    Portable Chest Xray  Result Date: 03/04/2018 CLINICAL DATA:  Respiratory failure. EXAM: PORTABLE CHEST 1 VIEW COMPARISON:  Radiograph of March 03, 2018. FINDINGS: Stable cardiomegaly. Endotracheal and nasogastric tubes are unchanged in position. No pneumothorax is noted. Stable bibasilar opacities are noted concerning for edema or infiltrates. Small bilateral pleural effusions cannot be excluded. Bony thorax is unremarkable. IMPRESSION: Stable support apparatus. Stable bibasilar opacities as described above. Electronically Signed   By: Marijo Conception, M.D.   On: 03/04/2018 07:31   Dg Chest Portable 1 View  Result Date:  03/03/2018 CLINICAL DATA:  67 year old male with shortness of breath. EXAM: PORTABLE CHEST 1 VIEW COMPARISON:  Chest radiograph dated 03/02/2018 FINDINGS: There is shallow inspiration with bibasilar atelectasis versus infiltrate. Probable small bilateral pleural effusions. The right costophrenic angle has been excluded from the image. Overall there is poor inspiratory effort compared to the prior radiograph with increased bibasilar densities. No pneumothorax. Stable cardiomegaly. Median sternotomy wires. No acute osseous pathology. Old right rib fractures. IMPRESSION: Shallow inspiration with bibasilar atelectasis versus infiltrate and probable small bilateral pleural effusions. Electronically Signed   By: Anner Crete M.D.   On: 03/03/2018 03:42   Dg Chest Portable 1 View  Result Date: 03/02/2018 CLINICAL DATA:  Shortness of breath with nausea and dry heaves. V-tach. EXAM: PORTABLE CHEST 1 VIEW COMPARISON:  02/24/2018. FINDINGS: Cardiomegaly. Prior CABG. BILATERAL pulmonary opacities most consistent with pulmonary edema although BILATERAL infiltrates are not excluded. No pneumothorax. Old rib fractures. IMPRESSION: Cardiomegaly with BILATERAL pulmonary opacities most consistent with pulmonary edema. Electronically Signed   By: Staci Righter M.D.   On: 03/02/2018 19:04   Dg Chest Port 1v Same Day  Result Date: 03/03/2018 CLINICAL DATA:  Nasogastric tube placement EXAM: PORTABLE CHEST 1 VIEW COMPARISON:  Portable exam 1355 hours compared 03/03/2018 at 0806 hours FINDINGS: Nasogastric tube coiled in stomach with tip extending below extent of film. Tip of endotracheal tube remains in satisfactory position 5.2 cm above the carina. Enlargement of cardiac silhouette post median sternotomy. Bibasilar infiltrates. No pleural effusion or pneumothorax. IMPRESSION: Nasogastric tube extends into stomach, tip beyond a fear your extent of exam. Enlargement of cardiac silhouette with persistent bibasilar infiltrates.  Electronically Signed   By: Lavonia Dana M.D.   On: 03/03/2018 14:14   Dg Chest Port 1v Same Day  Result Date: 03/03/2018 CLINICAL DATA:  Post intubation radiograph. EXAM: PORTABLE CHEST 1 VIEW COMPARISON:  Portable chest x-ray of today's date at 3:15 a.m. FINDINGS: The endotracheal tube tip projects approximately 6.5 cm above the carina. The tip is at the level of the inferior margin of the clavicular heads. Widespread interstitial densities are present with confluent densities at the lung bases. Small bilateral pleural effusions are present. The cardiac silhouette remains enlarged and the pulmonary vascularity engorged. The visualized sternal wires are intact. IMPRESSION: Interval intubation of the trachea. Advancement of the endotracheal tube by approximately 2-3 cm would be useful. Widespread interstitial and airspace opacities most compatible with pulmonary edema. Electronically Signed  By: David  Martinique M.D.   On: 03/03/2018 08:33    Cardiac Studies   Echocardiogram 12/02/2017: Study Conclusions  - Left ventricle: The cavity size was normal. Wall thickness was   increased in a pattern of moderate LVH. Images are limited even   with use of Definity contrast. Systolic function was moderately   to severely reduced. The estimated ejection fraction was in the   range of 30% to 35%. There is dyskinesis of the   mid-apicalanteroseptal myocardium. The study was not technically   sufficient to allow evaluation of LV diastolic dysfunction due to   atrial fibrillation. - Aortic valve: Mildly calcified annulus. Trileaflet; mildly   calcified leaflets. - Mitral valve: St. 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   There was no significant regurgitation or perivalvular   regurgitation. Valve area by pressure half-time: 1.58 cm^2. - Left atrium: The atrium was mildly dilated. - Right atrium: Central venous pressure (est): 3 mm Hg. - Atrial septum: No defect or patent foramen ovale was  identified. - Tricuspid valve: There was trivial regurgitation. - Pulmonary arteries: Systolic pressure could not be accurately   estimated. - Pericardium, extracardiac: There was no pericardial effusion.  Patient Profile     67 y.o. 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  He is admitted with hypoxic/hypercarbic respiratory failure, possible aspiration pneumonia, requiring mechanical ventilation.  Assessment & Plan    1.  Permanent atrial fibrillation with focus on heart rate control.  He was on Coreg as an outpatient, currently on IV Cardizem while n.p.o.  Stroke prophylaxis is currently with Lovenox while Xarelto is on hold.  2.  Acute on chronic combined heart failure.  Transition to IV Lasix, currently 40 mg IV every 12 hours.  He has had reasonable diuresis so far, approximately 1500 cc with creatinine bumping up from 1.31-1.89.  He has a known nonischemic cardiomyopathy, recent LVEF 30 to 35%. Delene Loll is held as well.  3.  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  Valve function was normal by echocardiogram in May of this year.  4.  Hypoxic/hypercarbic respiratory failure with suspected aspiration pneumonia.  He is being managed by the pulmonary team.  Would hold Lasix for now and repeat BMET in the morning.  Keep off Entresto as well.  Continue to follow chest x-ray on ventilator per pulmonary.  Heart rate control is adequate at this time.  Signed, Rozann Lesches, MD  03/04/2018, 9:39 AM

## 2018-03-04 NOTE — Procedures (Signed)
**Note De-Identified Vermelle C[place] Obfuscation** Extubation Procedure Note  Patient Details:   Name: Cody Hale DOB: Oct 16, 1950 MRN: [id number]   Airway Documentation:  Airway 7 mm (Active)  Secured at (cm) 23 cm 03/04/2018  7:46 AM  Measured From Lips 03/04/2018  7:46 AM  Secured Location Right 03/04/2018  7:46 AM  Secured By [employer] 03/04/2018  7:46 AM  Tube Holder Repositioned Yes 03/04/2018  7:46 AM  Cuff Pressure (cm H2O) 30 cm H2O 03/04/2018  4:22 AM  Site Condition Dry 03/04/2018  7:46 AM     Airway 7 mm (Active)   Vent end date: (not recorded) Vent end time: (not recorded)   Evaluation  O2 sats: transiently fell during during procedure Complications: No apparent complications Patient did tolerate procedure well. Bilateral Breath Sounds: Expiratory wheezes   Yes  See [place]har[place], Megan Salon 03/04/2018, 2:37 PM

## 2018-03-04 NOTE — Progress Notes (Signed)
Subjective: He remains intubated and on the ventilator.  He is been trying to see when he is going to come off the ventilator.  He is on 50% oxygen still.  Chest x-ray looks better by my reading.  He is negative over 2 L since admission and his creatinine has gone up some.  Objective: Vital signs in last 24 hours: Temp:  [97.9 F (36.6 C)-100.3 F (37.9 C)] 98.1 F (36.7 C) (08/28 0430) Pulse Rate:  [60-113] 82 (08/28 0645) Resp:  [14-29] 18 (08/28 0645) BP: (80-141)/(45-123) 117/69 (08/28 0645) SpO2:  [72 %-99 %] 98 % (08/28 0645) FiO2 (%):  [50 %-100 %] 50 % (08/28 0422) Weight:  [283 kg] 119 kg (08/28 0430) Weight change: 1.1 kg Last BM Date: (unknown)  Intake/Output from previous day: 08/27 0701 - 08/28 0700 In: 177.3 [IV Piggyback:177.3] Out: 1350 [Urine:750; Emesis/NG output:600]  PHYSICAL EXAM General appearance: Intubated sedated arousable will answer yes and no questions Resp: rales bilaterally and rhonchi bilaterally Cardio: Heart is irregular but with writer in the 80s now GI: soft, non-tender; bowel sounds normal; no masses,  no organomegaly Extremities: extremities normal, atraumatic, no cyanosis or edema  Lab Results:  Results for orders placed or performed during the hospital encounter of 03/02/18 (from the past 48 hour(s))  CBC with Differential     Status: Abnormal   Collection Time: 03/02/18  6:35 PM  Result Value Ref Range   WBC 7.0 4.0 - 10.5 K/uL   RBC 4.74 4.22 - 5.81 MIL/uL   Hemoglobin 14.9 13.0 - 17.0 g/dL   HCT 46.2 39.0 - 52.0 %   MCV 97.5 78.0 - 100.0 fL   MCH 31.4 26.0 - 34.0 pg   MCHC 32.3 30.0 - 36.0 g/dL   RDW 14.6 11.5 - 15.5 %   Platelets 160 150 - 400 K/uL   Neutrophils Relative % 88 %   Neutro Abs 6.2 1.7 - 7.7 K/uL   Lymphocytes Relative 8 %   Lymphs Abs 0.6 (L) 0.7 - 4.0 K/uL   Monocytes Relative 4 %   Monocytes Absolute 0.3 0.1 - 1.0 K/uL   Eosinophils Relative 0 %   Eosinophils Absolute 0.0 0.0 - 0.7 K/uL   Basophils  Relative 0 %   Basophils Absolute 0.0 0.0 - 0.1 K/uL    Comment: Performed at [facility], [address], [place], [place] [address]  Comprehensive metabolic panel     Status: Abnormal   Collection Time: 03/02/18  6:35 PM  Result Value Ref Range   Sodium 141 135 - 145 mmol/L   Potassium 4.2 3.5 - 5.1 mmol/L   Chloride 105 98 - 111 mmol/L   CO2 26 22 - 32 mmol/L   Glucose, Bld 204 (H) 70 - 99 mg/dL   BUN 16 8 - 23 mg/dL   Creatinine, Ser 0.96 0.61 - 1.24 mg/dL   Calcium 9.1 8.9 - 10.3 mg/dL   Total Protein 7.5 6.5 - 8.1 g/dL   Albumin 4.0 3.5 - 5.0 g/dL   AST 28 15 - 41 U/L   ALT 24 0 - 44 U/L   Alkaline Phosphatase 90 38 - 126 U/L   Total Bilirubin 1.1 0.3 - 1.2 mg/dL   GFR calc non Af Amer >60 >60 mL/min   GFR calc Af Amer >60 >60 mL/min    Comment: (NOTE) The eGFR has been calculated using the CKD EPI equation. This calculation has not been validated in all clinical situations. eGFR's persistently <60 mL/min signify possible  Chronic Kidney Disease.    Anion gap 10 5 - 15    Comment: Performed at [facility], [address], [place], [place] [address]  Lipase, blood     Status: None   Collection Time: 03/02/18  6:35 PM  Result Value Ref Range   Lipase 23 11 - 51 U/L    Comment: Performed at [facility], [address], [place], [place] [address]  Troponin I     Status: None   Collection Time: 03/02/18  6:35 PM  Result Value Ref Range   Troponin I <0.03 <0.03 ng/mL    Comment: Performed at [facility], [address] [place], [place] [address]  Magnesium     Status: None   Collection Time: 03/02/18  6:35 PM  Result Value Ref Range   Magnesium 2.1 1.7 - 2.4 mg/dL    Comment: Performed at [facility], [address] [place], [place] [address]  Brain natriuretic peptide     Status: Abnormal   Collection Time: 03/02/18  6:35 PM  Result Value Ref Range   B Natriuretic Peptide 435.0 (H) 0.0 - 100.0 pg/mL    Comment: Performed at [facility], [name]  [place], [place] [address]  I-stat troponin, ED     Status: None   Collection Time: 03/02/18  6:36 PM  Result Value Ref Range   Troponin i, poc 0.01 0.00 - 0.08 ng/mL   Comment 3            Comment: Due to the release kinetics of cTnI, a negative result within the first hours of the onset of symptoms does not rule out myocardial infarction with certainty. If myocardial infarction is still suspected, repeat the test at appropriate intervals.   I-Stat Chem 8, ED     Status: Abnormal   Collection Time: 03/02/18  6:37 PM  Result Value Ref Range   Sodium 142 135 - 145 mmol/L   Potassium 4.2 3.5 - 5.1 mmol/L   Chloride 103 98 - 111 mmol/L   BUN 17 8 - 23 mg/dL   Creatinine, Ser 0.90 0.61 - 1.24 mg/dL   Glucose, Bld 201 (H) 70 - 99 mg/dL   Calcium, Ion 1.18 1.15 - 1.40 mmol/L   TCO2 26 22 - 32 mmol/L   Hemoglobin 16.0 13.0 - 17.0 g/dL   HCT 47.0 39.0 - 52.0 %  Lactic acid, plasma     Status: None   Collection Time: 03/02/18  6:56 PM  Result Value Ref Range   Lactic Acid, Venous 0.9 0.5 - 1.9 mmol/L    Comment: Performed at [facility], [address] [place], [place] [address]  Blood gas, arterial     Status: Abnormal   Collection Time: 03/02/18  8:40 PM  Result Value Ref Range   FIO2 100.00    Delivery systems BILEVEL POSITIVE AIRWAY PRESSURE    Inspiratory PAP 16.0    Expiratory PAP 8.0    pH, Arterial 7.134 (LL) 7.350 - 7.450    Comment: CALLED TO TURNER C RN AT 2050 03/02/18 P[place]TMAN S RRT   pCO2 arterial 81.7 (HH) 32.0 - 48.0 mmHg    Comment: CALLED TO TURNER C RN AT 2050 03/02/18 P[place] S RRT   pO2, Arterial 150.0 (H) 83.0 - 108.0 mmHg   Bicarbonate 19.8 (L) 20.0 - 28.0 mmol/L   Acid-base deficit 1.9 0.0 - 2.0 mmol/L   O2 Saturation 98.3 %   Patient temperature 37.0    Collection site RIGHT RADIAL  Drawn by [id number]    Sample type ARTERIAL    Allens test (pass/fail) PASS PASS    Comment: Performed at [facility], [address] [place], [place] [address]  Lactic acid,  plasma     Status: None   Collection Time: 03/02/18  9:20 PM  Result Value Ref Range   Lactic Acid, Venous 1.6 0.5 - 1.9 mmol/L    Comment: Performed at [facility], [address] [place], [place] [address]  Blood gas, arterial ([facility] & [facility] ONLY)     Status: Abnormal   Collection Time: 03/02/18 10:30 PM  Result Value Ref Range   FIO2 40.00    Delivery systems BILEVEL POSITIVE AIRWAY PRESSURE    Inspiratory PAP 16.0    Expiratory PAP 8.0    pH, Arterial 7.237 (L) 7.350 - 7.450   pCO2 arterial 60.5 (H) 32.0 - 48.0 mmHg   pO2, Arterial 83.2 83.0 - 108.0 mmHg   Bicarbonate 21.2 20.0 - 28.0 mmol/L   Acid-base deficit 1.7 0.0 - 2.0 mmol/L   O2 Saturation 94.5 %   Patient temperature 37.0    Collection site RIGHT RADIAL    Drawn by [id number]    Sample type ARTERIAL    Allens test (pass/fail) PASS PASS    Comment: Performed at [facility], [address], [place], [place] [address]  MRSA PCR Screening     Status: None   Collection Time: 03/03/18  4:02 AM  Result Value Ref Range   MRSA by PCR NEGATIVE NEGATIVE    Comment:        The GeneXpert MRSA Assay (FDA approved for NASAL specimens only), is one component of a comprehensive MRSA colonization surveillance program. It is not intended to diagnose MRSA infection nor to guide or monitor treatment for MRSA infections. Performed at [facility], [address], [place], [place] [address]   Blood gas, arterial     Status: Abnormal   Collection Time: 03/03/18  4:15 AM  Result Value Ref Range   FIO2 50.00    Delivery systems BILEVEL POSITIVE AIRWAY PRESSURE    Inspiratory PAP 16.0    Expiratory PAP 8.0    pH, Arterial 7.323 (L) 7.350 - 7.450   pCO2 arterial 51.9 (H) 32.0 - 48.0 mmHg   pO2, Arterial 60.3 (L) 83.0 - 108.0 mmHg   Bicarbonate 23.9 20.0 - 28.0 mmol/L   Acid-Base Excess 0.8 0.0 - 2.0 mmol/L   O2 Saturation 89.4 %   Patient temperature 37.0    Collection site LEFT RADIAL    Drawn by [id number]    Sample type ARTERIAL     Allens test (pass/fail) PASS PASS    Comment: Performed at [facility], [address] [place], [place] [address]  Urinalysis, Routine w reflex microscopic     Status: Abnormal   Collection Time: 03/03/18  4:30 AM  Result Value Ref Range   Color, Urine YELLOW YELLOW   APPearance HAZY (A) CLEAR   Specific Gravity, Urine 1.014 1.005 - 1.030   pH 5.0 5.0 - 8.0   Glucose, UA >=500 (A) NEGATIVE mg/dL   Hgb urine dipstick NEGATIVE NEGATIVE   Bilirubin Urine NEGATIVE NEGATIVE   Ketones, ur 5 (A) NEGATIVE mg/dL   Protein, ur 100 (A) NEGATIVE mg/dL   Nitrite NEGATIVE NEGATIVE   Leukocytes, UA NEGATIVE NEGATIVE   RBC / HPF 0-5 0 - 5 RBC/hpf   WBC, UA 0-5 0 - 5 WBC/hpf   Bacteria, UA RARE (A) NONE SEEN  Squamous Epithelial / LPF 0-5 0 - 5   Mucus PRESENT    Hyaline Casts, UA PRESENT     Comment: Performed at [facility], [address] [place], [place] [address]  Hemoglobin A1c     Status: Abnormal   Collection Time: 03/03/18  4:31 AM  Result Value Ref Range   Hgb A1c MFr Bld 6.0 (H) 4.8 - 5.6 %    Comment: (NOTE) Pre diabetes:          5.7%-6.4% Diabetes:              >6.4% Glycemic control for   <7.0% adults with diabetes    Mean Plasma Glucose 125.5 mg/dL    Comment: Performed at [facility] [address] [place], [place] [address]  CBC     Status: Abnormal   Collection Time: 03/03/18  4:31 AM  Result Value Ref Range   WBC 15.4 (H) 4.0 - 10.5 K/uL   RBC 5.07 4.22 - 5.81 MIL/uL   Hemoglobin 16.0 13.0 - 17.0 g/dL   HCT 50.7 39.0 - 52.0 %   MCV 100.0 78.0 - 100.0 fL   MCH 31.6 26.0 - 34.0 pg   MCHC 31.6 30.0 - 36.0 g/dL   RDW 14.9 11.5 - 15.5 %   Platelets 184 150 - 400 K/uL    Comment: Performed at [facility], [address] [place], [place] [address]  Comprehensive metabolic panel     Status: Abnormal   Collection Time: 03/03/18  4:31 AM  Result Value Ref Range   Sodium 142 135 - 145 mmol/L   Potassium 5.2 (H) 3.5 - 5.1 mmol/L    Comment: DELTA CHECK NOTED    Chloride 107 98 - 111 mmol/L   CO2 25 22 - 32 mmol/L   Glucose, Bld 232 (H) 70 - 99 mg/dL   BUN 22 8 - 23 mg/dL   Creatinine, Ser 1.31 (H) 0.61 - 1.24 mg/dL   Calcium 9.1 8.9 - 10.3 mg/dL   Total Protein 7.3 6.5 - 8.1 g/dL   Albumin 3.7 3.5 - 5.0 g/dL   AST 27 15 - 41 U/L   ALT 22 0 - 44 U/L   Alkaline Phosphatase 96 38 - 126 U/L   Total Bilirubin 1.0 0.3 - 1.2 mg/dL   GFR calc non Af Amer 55 (L) >60 mL/min   GFR calc Af Amer >60 >60 mL/min    Comment: (NOTE) The eGFR has been calculated using the CKD EPI equation. This calculation has not been validated in all clinical situations. eGFR's persistently <60 mL/min signify possible Chronic Kidney Disease.    Anion gap 10 5 - 15    Comment: Performed at [facility], [address] [place], [place] [address]  Glucose, capillary     Status: Abnormal   Collection Time: 03/03/18  6:14 AM  Result Value Ref Range   Glucose-Capillary 219 (H) 70 - 99 mg/dL  Draw ABG 1 hour after initiation of ventilator     Status: Abnormal   Collection Time: 03/03/18  9:30 AM  Result Value Ref Range   FIO2 1.00    Delivery systems VENTILATOR    Mode PRESSURE REGULATED VOLUME CONTROL    VT 620 mL   LHR 16 resp/min   Peep/cpap 8.0 cm H20   pH, Arterial 7.340 (L) 7.350 - 7.450   pCO2 arterial 47.9 32.0 - 48.0 mmHg   pO2, Arterial 100 83.0 - 108.0 mmHg   Bicarbonate 23.8 20.0 -  28.0 mmol/L   Acid-Base Excess 0.1 0.0 - 2.0 mmol/L   O2 Saturation 97.8 %   Patient temperature 37.0    Collection site LEFT RADIAL    Sample type 573,220    Allens test (pass/fail) PASS PASS    Comment: Performed at [facility], [address] [place], [place] [address]  Culture, blood (Routine X 2) w Reflex to ID Panel     Status: None (Preliminary result)   Collection Time: 03/03/18 10:43 AM  Result Value Ref Range   Specimen Description BLOOD LEFT WRIST    Special Requests      BOTTLES DRAWN AEROBIC AND ANAEROBIC Blood Culture adequate volume Performed at [facility], [address], [place], [place] [address]    Culture PENDING    Report Status PENDING   Culture, blood (Routine X 2) w Reflex to ID Panel     Status: None (Preliminary result)   Collection Time: 03/03/18 10:51 AM  Result Value Ref Range   Specimen Description BLOOD LEFT HAND    Special Requests      BOTTLES DRAWN AEROBIC AND ANAEROBIC Blood Culture adequate volume Performed at [facility], [address], [place], [place] [address]    Culture PENDING    Report Status PENDING   Culture, respiratory     Status: None (Preliminary result)   Collection Time: 03/03/18 11:48 AM  Result Value Ref Range   Specimen Description      TRACHEAL ASPIRATE Performed at [facility], [address] [place], [place] [address]    Special Requests      NONE Performed at [facility], [address] [place], [place] [address]    Gram Stain      ABUNDANT WBC PRESENT, PREDOMINANTLY PMN RARE GRAM POSITIVE COCCI Performed at [facility], [place] [address] [place],  [address]    Culture PENDING    Report Status PENDING   Glucose, capillary     Status: Abnormal   Collection Time: 03/03/18  1:03 PM  Result Value Ref Range   Glucose-Capillary 194 (H) 70 - 99 mg/dL  Glucose, capillary     Status: Abnormal   Collection Time: 03/03/18  4:41 PM  Result Value Ref Range   Glucose-Capillary 205 (H) 70 - 99 mg/dL  Glucose, capillary     Status: Abnormal   Collection Time: 03/03/18  7:58 PM  Result Value Ref Range   Glucose-Capillary 184 (H) 70 - 99 mg/dL   Comment 1 Notify RN    Comment 2 Document in Chart   Glucose, capillary     Status: Abnormal   Collection Time: 03/04/18 12:09 AM  Result Value Ref Range   Glucose-Capillary 188 (H) 70 - 99 mg/dL  Glucose, capillary     Status: Abnormal   Collection Time: 03/04/18  4:14 AM  Result Value Ref Range   Glucose-Capillary 186 (H) 70 - 99 mg/dL  Basic metabolic panel     Status: Abnormal   Collection Time: 03/04/18  5:45 AM  Result  Value Ref Range   Sodium 144 135 - 145 mmol/L   Potassium 4.5 3.5 - 5.1 mmol/L   Chloride 106 98 - 111 mmol/L   CO2 27 22 - 32 mmol/L   Glucose, Bld 221 (H) 70 - 99 mg/dL   BUN 39 (H) 8 - 23 mg/dL   Creatinine, Ser 1.89 (H) 0.61 - 1.24 mg/dL   Calcium 9.0 8.9 - 10.3 mg/dL   GFR calc non Af Amer 35 (  L) >60 mL/min   GFR calc Af Amer 41 (L) >60 mL/min    Comment: (NOTE) The eGFR has been calculated using the CKD EPI equation. This calculation has not been validated in all clinical situations. eGFR's persistently <60 mL/min signify possible Chronic Kidney Disease.    Anion gap 11 5 - 15    Comment: Performed at [facility], [address], [place], [place] [address]  CBC     Status: Abnormal   Collection Time: 03/04/18  5:45 AM  Result Value Ref Range   WBC 12.7 (H) 4.0 - 10.5 K/uL   RBC 4.30 4.22 - 5.81 MIL/uL   Hemoglobin 13.4 13.0 - 17.0 g/dL   HCT 42.0 39.0 - 52.0 %   MCV 97.7 78.0 - 100.0 fL   MCH 31.2 26.0 - 34.0 pg   MCHC 31.9 30.0 - 36.0 g/dL   RDW 15.1 11.5 - 15.5 %   Platelets 146 (L) 150 - 400 K/uL    Comment: SPECIMEN CHECKED FOR CLOTS PLATELET COUNT CONFIRMED BY SMEAR Performed at [facility], [address] [place], [place] [address]     ABGS Recent Labs    03/02/18 1837  03/03/18 0930  PHART  --    < > 7.340*  PO2ART  --    < > 100  TCO2 26  --   --   HCO3  --    < > 23.8   < > = values in this interval not displayed.   CULTURES Recent Results (from the past 240 hour(s))  MRSA PCR Screening     Status: None   Collection Time: 03/03/18  4:02 AM  Result Value Ref Range Status   MRSA by PCR NEGATIVE NEGATIVE Final    Comment:        The GeneXpert MRSA Assay (FDA approved for NASAL specimens only), is one component of a comprehensive MRSA colonization surveillance program. It is not intended to diagnose MRSA infection nor to guide or monitor treatment for MRSA infections. Performed at [facility], [address] [place], [place] [address]    Culture, blood (Routine X 2) w Reflex to ID Panel     Status: None (Preliminary result)   Collection Time: 03/03/18 10:43 AM  Result Value Ref Range Status   Specimen Description BLOOD LEFT WRIST  Final   Special Requests   Final    BOTTLES DRAWN AEROBIC AND ANAEROBIC Blood Culture adequate volume Performed at [facility], [address] [place], [place] [address]    Culture PENDING  Incomplete   Report Status PENDING  Incomplete  Culture, blood (Routine X 2) w Reflex to ID Panel     Status: None (Preliminary result)   Collection Time: 03/03/18 10:51 AM  Result Value Ref Range Status   Specimen Description BLOOD LEFT HAND  Final   Special Requests   Final    BOTTLES DRAWN AEROBIC AND ANAEROBIC Blood Culture adequate volume Performed at [facility], [address] [place], [place] [address]    Culture PENDING  Incomplete   Report Status PENDING  Incomplete  Culture, respiratory     Status: None (Preliminary result)   Collection Time: 03/03/18 11:48 AM  Result Value Ref Range Status   Specimen Description   Final    TRACHEAL ASPIRATE Performed at [facility], [address], [place], [place] [address]    Special Requests   Final    NONE Performed at [facility], [address], [place], [place] [address]  Gram Stain   Final    ABUNDANT WBC PRESENT, PREDOMINANTLY PMN RARE GRAM POSITIVE COCCI Performed at [facility], [place] [address] [place], [place] [address]    Culture PENDING  Incomplete   Report Status PENDING  Incomplete   Studies/Results: Dg Chest Portable 1 View  Result Date: 03/03/2018 CLINICAL DATA:  67 year old male with shortness of breath. EXAM: PORTABLE CHEST 1 VIEW COMPARISON:  Chest radiograph dated 03/02/2018 FINDINGS: There is shallow inspiration with bibasilar atelectasis versus infiltrate. Probable small bilateral pleural effusions. The right costophrenic angle has been excluded from the image. Overall there is poor inspiratory effort compared to  the prior radiograph with increased bibasilar densities. No pneumothorax. Stable cardiomegaly. Median sternotomy wires. No acute osseous pathology. Old right rib fractures. IMPRESSION: Shallow inspiration with bibasilar atelectasis versus infiltrate and probable small bilateral pleural effusions. Electronically Signed   By: Anner Crete M.D.   On: 03/03/2018 03:42   Dg Chest Portable 1 View  Result Date: 03/02/2018 CLINICAL DATA:  Shortness of breath with nausea and dry heaves. V-tach. EXAM: PORTABLE CHEST 1 VIEW COMPARISON:  02/24/2018. FINDINGS: Cardiomegaly. Prior CABG. BILATERAL pulmonary opacities most consistent with pulmonary edema although BILATERAL infiltrates are not excluded. No pneumothorax. Old rib fractures. IMPRESSION: Cardiomegaly with BILATERAL pulmonary opacities most consistent with pulmonary edema. Electronically Signed   By: Staci Righter M.D.   On: 03/02/2018 19:04   Dg Chest Port 1v Same Day  Result Date: 03/03/2018 CLINICAL DATA:  Nasogastric tube placement EXAM: PORTABLE CHEST 1 VIEW COMPARISON:  Portable exam 1355 hours compared 03/03/2018 at 0806 hours FINDINGS: Nasogastric tube coiled in stomach with tip extending below extent of film. Tip of endotracheal tube remains in satisfactory position 5.2 cm above the carina. Enlargement of cardiac silhouette post median sternotomy. Bibasilar infiltrates. No pleural effusion or pneumothorax. IMPRESSION: Nasogastric tube extends into stomach, tip beyond a fear your extent of exam. Enlargement of cardiac silhouette with persistent bibasilar infiltrates. Electronically Signed   By: Lavonia Dana M.D.   On: 03/03/2018 14:14   Dg Chest Port 1v Same Day  Result Date: 03/03/2018 CLINICAL DATA:  Post intubation radiograph. EXAM: PORTABLE CHEST 1 VIEW COMPARISON:  Portable chest x-ray of today's date at 3:15 a.m. FINDINGS: The endotracheal tube tip projects approximately 6.5 cm above the carina. The tip is at the level of the inferior margin  of the clavicular heads. Widespread interstitial densities are present with confluent densities at the lung bases. Small bilateral pleural effusions are present. The cardiac silhouette remains enlarged and the pulmonary vascularity engorged. The visualized sternal wires are intact. IMPRESSION: Interval intubation of the trachea. Advancement of the endotracheal tube by approximately 2-3 cm would be useful. Widespread interstitial and airspace opacities most compatible with pulmonary edema. Electronically Signed   By: David  Martinique M.D.   On: 03/03/2018 08:33    Medications:  Prior to Admission:  Medications Prior to Admission  Medication Sig Dispense Refill Last Dose  . carvedilol (COREG) 6.25 MG tablet TAKE 1 TABLET BY MOUTH TWICE DAILY. (Patient taking differently: Take 6.25 mg by mouth 2 (two) times daily with a meal. ) 60 tablet 0 03/02/2018 at 1700  . colchicine 0.6 MG tablet Take 0.6 mg by mouth 2 (two) times daily.   03/01/2018 at Unknown time  . diphenoxylate-atropine (LOMOTIL) 2.5-0.025 MG tablet Take 1 tablet by mouth 4 (four) times daily as needed for diarrhea or loose stools. 45 tablet 0 unknown  . fluticasone (FLONASE) 50 MCG/ACT nasal spray Place 2  sprays into both nostrils daily. 16 g 5 03/01/2018 at Unknown time  . guaiFENesin (ROBITUSSIN) 100 MG/5ML liquid Take 100 mg by mouth 3 (three) times daily as needed for cough.   unknown  . insulin aspart (NOVOLOG) 100 UNIT/ML injection Inject 5 Units into the skin 2 (two) times daily. Inject 5 units subcutaneously twice daily before lunch and supper hold if BX <150 same as human insulin   03/01/2018 at Unknown time  . INVOKANA 100 MG TABS tablet TAKE ONE TABLET BY MOUTH ONCE DAILY BEFORE BREAKFAST. (Patient taking differently: Take 100 mg by mouth daily. ) 30 tablet 0 03/01/2018 at Unknown time  . lactobacillus acidophilus (BACID) TABS tablet Take 1 tablet by mouth 2 (two) times daily.    03/01/2018 at Unknown time  . LANTUS SOLOSTAR 100 UNIT/ML  Solostar Pen INJECT 70 UNITS SUBCUTANEOUSLY AT 10pm. (Patient taking differently: Inject 70 Units into the skin at bedtime. ) 15 mL 3 03/01/2018 at Unknown time  . levocetirizine (XYZAL) 5 MG tablet Take 1 tablet (5 mg total) by mouth every evening. 90 tablet 1 03/01/2018 at Unknown time  . metFORMIN (GLUCOPHAGE-XR) 750 MG 24 hr tablet TAKE 2 TABLETS BY MOUTH EACH MORNING WITH BREAKFAST. (Patient taking differently: Take 750 mg by mouth daily with breakfast. ) 60 tablet 5 03/01/2018 at Unknown time  . methocarbamol (ROBAXIN) 500 MG tablet Take 1 tablet (500 mg total) by mouth every 6 (six) hours as needed for muscle spasms. 60 tablet 1 Past Month at Unknown time  . mupirocin ointment (BACTROBAN) 2 % PLACE A SMALL AMOUT TO TOP OF INCITION AT RIGHT HIP & DRY DRESSING DAILY. (Patient taking differently: Apply 1 application topically daily. Applied to right hip and dry dressing daily) 22 g 0 03/01/2018 at Unknown time  . nitroGLYCERIN (NITROSTAT) 0.4 MG SL tablet Place 1 tablet (0.4 mg total) under the tongue every 5 (five) minutes as needed for chest pain. Max 3 doses. 10 tablet 0 unknown  . ondansetron (ZOFRAN) 4 MG tablet Take 4 mg by mouth every 8 (eight) hours as needed for nausea or vomiting.   unknown  . oxyCODONE (OXY IR/ROXICODONE) 5 MG immediate release tablet Take 1-2 tablets (5-10 mg total) by mouth every 4 (four) hours as needed for moderate pain (pain score 4-6). 30 tablet 0 Past Week at Unknown time  . polyethylene glycol (MIRALAX / GLYCOLAX) packet Take 17 g by mouth daily as needed.   unknown  . potassium chloride SA (K-DUR,KLOR-CON) 20 MEQ tablet Take 20 mEq by mouth daily.   03/01/2018 at Unknown time  . Probiotic Product (RISA-BID PROBIOTIC) TABS Take 1 tablet by mouth 2 (two) times daily.   03/02/2018 at Unknown time  . sacubitril-valsartan (ENTRESTO) 24-26 MG Take 1 tablet by mouth 2 (two) times daily. 60 tablet 6 03/01/2018 at Unknown time  . simvastatin (ZOCOR) 20 MG tablet TAKE 1 TABLET BY  MOUTH ONCE DAILY. (Patient taking differently: Take 20 mg by mouth every evening. ) 30 tablet 5 03/01/2018 at Unknown time  . tiZANidine (ZANAFLEX) 4 MG tablet Take 1 tablet (4 mg total) by mouth every 8 (eight) hours as needed for muscle spasms. 60 tablet 0 unknown  . torsemide (DEMADEX) 20 MG tablet Take 20 mg by mouth 2 (two) times daily.   03/02/2018 at 1700  . VIIBRYD 40 MG TABS TAKE 1 TABLET BY MOUTH ONCE DAILY. (Patient taking differently: Take 40 mg by mouth daily. ) 30 tablet 0 03/01/2018 at Unknown time  . Alveda Reasons  20 MG TABS tablet TAKE 1 TABLET BY MOUTH ONCE DAILY. (Patient taking differently: Take 20 mg by mouth every evening. ) 30 tablet 5 03/01/2018 at 1700  . Multiple Vitamins-Minerals (THERA-M) TABS TAKE 1 TABLET BY MOUTH ONCE DAILY. (Patient not taking: Reported on 03/02/2018) 90 tablet 1 Not Taking at Unknown time   Scheduled: . chlorhexidine gluconate (MEDLINE KIT)  15 mL Mouth Rinse BID  . colchicine  0.6 mg Oral BID  . enoxaparin (LOVENOX) injection  120 mg Subcutaneous Q12H  . furosemide  40 mg Intravenous Q12H  . insulin aspart  0-9 Units Subcutaneous Q4H  . insulin glargine  70 Units Subcutaneous QHS  . ipratropium-albuterol  3 mL Nebulization Q6H  . mouth rinse  15 mL Mouth Rinse 10 times per day  . methylPREDNISolone (SOLU-MEDROL) injection  40 mg Intravenous Q12H  . mupirocin ointment  1 application Topical Daily  . sodium chloride flush  3 mL Intravenous Q12H   Continuous: . sodium chloride    . diltiazem (CARDIZEM) infusion 5 mg/hr (03/04/18 0120)  . famotidine (PEPCID) IV Stopped (03/03/18 2247)  . piperacillin-tazobactam (ZOSYN)  IV Stopped (03/04/18 0429)   IYM:EBRAXE chloride, acetaminophen, fentaNYL (SUBLIMAZE) injection, fentaNYL (SUBLIMAZE) injection, metoCLOPramide (REGLAN) injection, midazolam, midazolam, ondansetron **OR** ondansetron (ZOFRAN) IV, polyethylene glycol, sodium chloride flush, tiZANidine  Assesment: He was admitted with abdominal pain nausea  and vomiting.  When he arrived in the emergency department he was found to have acute respiratory failure with hypoxia and hypercapnia initially treated with BiPAP.  However he continued having trouble with nausea and vomiting vomited into the BiPAP mask and aspirated.  He developed increasing respiratory distress culminating in him being intubated and placed on mechanical ventilation.  He has remained on the ventilator since.  This is about 24 hours now.  He has been able to come down from 100% oxygen to 50% oxygen.  He is being treated for aspiration pneumonia.  Chest x-ray is improved but not clear  He had some element of heart failure it appeared on initial chest x-ray and he is been diuresed a little over 2 L but has had elevation of his creatinine and diuresis will be stopped at this point.  His cardiac EF was around 30% on the last echocardiogram  He has atrial fib had rapid ventricular response but he is better on diltiazem.  He has diabetes on sliding scale she is fairly well controlled.  He is on steroids.  He remains critically ill with multisystem failure Principal Problem:   Acute respiratory failure with hypoxia and hypercapnia (HCC) Active Problems:   Type II diabetes mellitus with manifestations (HCC)   Chronic atrial fibrillation (HCC)   Acute respiratory failure ([place])    Plan: Continue treatments.  If we can get him to 45% or so we can see what he can do with weaning.  To new other treatments.    LOS: 1 day   Cody Hale L 03/04/2018, 7:29 AM

## 2018-03-04 NOTE — Progress Notes (Signed)
**Note De-Identified  Obfuscation** RN informed RT that the in-line catheter was not advancing during suctions and vent was alarming.  RRT attempted multiple times to advance the catheter. Patient was anxious and not receiving volumes on ventilator.  RT removed patient from ventilator and initiated bagging with 100% FIO2; unable to ventilate patient. SAT 60%. Patient immediately extubated and bagged until patient returned SAT at 90% and placed on 100% NRB; tolerating well . Anesthesiologist arrived and patient was alert and responding appropriately.  RRT to continue to monitor.

## 2018-03-04 NOTE — Progress Notes (Signed)
Bedside nursing swallow study done. Pt tolerated well. Pt changed to full liquid diet. One episode vomiting after eating. Small clear colored bile. Medicated with zofran. Effective.

## 2018-03-05 ENCOUNTER — Ambulatory Visit: Payer: Self-pay | Admitting: Internal Medicine

## 2018-03-05 DIAGNOSIS — N289 Disorder of kidney and ureter, unspecified: Secondary | ICD-10-CM

## 2018-03-05 LAB — CULTURE, RESPIRATORY: Culture: NORMAL

## 2018-03-05 LAB — BASIC METABOLIC PANEL
Anion gap: 14 (ref 5–15)
BUN: 41 mg/dL — ABNORMAL HIGH (ref 8–23)
CO2: 26 mmol/L (ref 22–32)
Calcium: 9.2 mg/dL (ref 8.9–10.3)
Chloride: 105 mmol/L (ref 98–111)
Creatinine, Ser: 1.25 mg/dL — ABNORMAL HIGH (ref 0.61–1.24)
GFR calc Af Amer: >60 mL/min (ref >60)
GFR calc non Af Amer: 58 mL/min — ABNORMAL LOW (ref >60)
Glucose, Bld: 216 mg/dL — ABNORMAL HIGH (ref 70–99)
Potassium: 4.3 mmol/L (ref 3.5–5.1)
Sodium: 145 mmol/L (ref 135–145)

## 2018-03-05 LAB — MAGNESIUM: Magnesium: 2.3 mg/dL (ref 1.7–2.4)

## 2018-03-05 LAB — GLUCOSE, CAPILLARY
Glucose-Capillary: 196 mg/dL — ABNORMAL HIGH (ref 70–99)
Glucose-Capillary: 256 mg/dL — ABNORMAL HIGH (ref 70–99)
Glucose-Capillary: 202 mg/dL — ABNORMAL HIGH (ref 70–99)
Glucose-Capillary: 234 mg/dL — ABNORMAL HIGH (ref 70–99)
Glucose-Capillary: 190 mg/dL — ABNORMAL HIGH (ref 70–99)
Glucose-Capillary: 189 mg/dL — ABNORMAL HIGH (ref 70–99)
Glucose-Capillary: 190 mg/dL — ABNORMAL HIGH (ref 70–99)

## 2018-03-05 LAB — CULTURE, RESPIRATORY W GRAM STAIN

## 2018-03-05 MED ORDER — RIVAROXABAN 15 MG PO TABS: 15.0000 mg | ORAL_TABLET | Freq: Every evening | ORAL | Status: DC

## 2018-03-05 MED ORDER — RIVAROXABAN 20 MG PO TABS: 20.0000 mg | ORAL_TABLET | Freq: Every evening | ORAL | Status: DC

## 2018-03-05 MED ORDER — RIVAROXABAN 20 MG PO TABS
20.0000 mg | ORAL_TABLET | Freq: Every evening | ORAL | Status: DC
  Administered 2018-03-05 – 2018-03-07 (×3): 20 mg via ORAL
  Filled 2018-03-05 (×3): qty 1

## 2018-03-05 MED ORDER — CARVEDILOL 3.125 MG PO TABS
6.2500 mg | ORAL_TABLET | Freq: Two times a day (BID) | ORAL | Status: DC
  Administered 2018-03-05 – 2018-03-08 (×7): 6.25 mg via ORAL
  Filled 2018-03-05 (×7): qty 2

## 2018-03-05 MED ORDER — ORAL CARE MOUTH RINSE
15.0000 mL | Freq: Two times a day (BID) | OROMUCOSAL | Status: DC
  Administered 2018-03-05 – 2018-03-08 (×8): 15 mL via OROMUCOSAL

## 2018-03-05 NOTE — Progress Notes (Signed)
PROGRESS NOTE    Cody Hale  ZOX:[id number] DOB: [birth year] DOA: 03/02/2018 PCP: [name], MD   Brief Narrative:   67 year old man admitted from home on 8/26 due to nausea and vomiting. He has a history of chronic atrial fibrillation on chronic anticoagulation with Xarelto, chronic systolic heart failure with ejection fraction of 30 to 35%, he has a bioprosthetic mitral valve, COPD and hypertension. He developed nausea and dry heaving after eating lunch on the day of admission. In the emergency department he was found to have significant shortness of breath and blood glass showed respiratory acidosis with a PCO2 in the 80s. He was started on BiPAP. He subsequently vomited and initial plans were made for intubation, however because he stabilized, was alert and oriented and was saturating in the 90s intubation plans were deferred. A few hours later he further decompensated with repeat emesis into the BiPAP mask and this time did not recover. Anesthesia was called for intubation this was performed at around 7:52 AM on 8/27. Repeat chest x-ray shows some improvement.  He had mild pulmonary edema for which she has received Lasix with good diuresis noted.  He is now developing some AKI and Lasix has been discontinued.  He had been extubated on 8/28 after he was noted to have a thick mucous plug.  Assessment & Plan:   Principal Problem:   Acute respiratory failure with hypoxia and hypercapnia (HCC) Active Problems:   Type II diabetes mellitus with manifestations (HCC)   Chronic atrial fibrillation (HCC)   Acute respiratory failure (HCC)   1. Acute hypoxemic and hypercarbic respiratory failure likely secondary to aspiration pneumonia, acute COPD exacerbation, and acute on chronic systolic heart failure-improving.    Patient still remains on high flow 10 L nasal cannula and was extubated yesterday on account of a mucous plug.  Appreciate pulmonology and cardiology recommendations.  Continue Solu-Medrol and breathing treatments.  Continue IV Zosyn. 2. AKI.  Secondary to aggressive diuresis.    Continue to hold Lasix until repeat labs returned this morning. 3. Atrial fibrillation with RVR-improving.  Per cardiology, DC IV diltiazem and transition to oral Coreg and Xarelto as he is tolerating p.o.  Will need continued telemetry monitoring for now to ensure that there is stability with heart rate. 4. Acute on chronic systolic heart failure.  Noted to have LVEF of 30 to 35% with dyskinesis on 2D echocardiogram from 11/2017.  Holding Lasix for now.  Repeat labs pending. 5. Insulin-dependent diabetes with hyperglycemia-improving.  Will continue sliding scale today as he appears to be having some steroid-induced hyperglycemia.  Continue to monitor. 6. Status post right hip arthroplasty.  This was approximately 45 days ago and is currently stable.   DVT prophylaxis:  Xarelto Code Status: Full Family Communication: No family at bedside Disposition Plan:  Continue to monitor in ICU to ensure that heart rate is stable and the patient can be weaned off nasal cannula 10 L.  Plan to transition to general medical floor by tomorrow if improved.   Consultants:   Pulmonology  Cardiology  Procedures:   Intubation on 8/27 with extubation on 8/28  Antimicrobials:   Zosyn 8/27->  Subjective: Patient seen and evaluated today with no new acute complaints or concerns.  He is swallowing well and has been transitioned to some oral medications by cardiology.  He continues to remain on 10 L nasal cannula.  Objective: Vitals:   03/05/18 0445 03/05/18 0500 03/05/18 0800 03/05/18 0856  BP: (!) 160/122 (!) 145/68  Marland Kitchen)  155/95  Pulse: 98 93  80  Resp: 15 (!) 1    Temp:   (!) 97.4 F (36.3 C)   TempSrc:   Oral   SpO2: 97% 96%    Weight:  119 kg    Height:        Intake/Output Summary (Last 24 hours) at 03/05/2018 0929 Last data filed at 03/05/2018 0902 Gross per 24 hour  Intake  1692.59 ml  Output 1150 ml  Net 542.59 ml   Filed Weights   03/03/18 0502 03/04/18 0430 03/05/18 0500  Weight: 124.5 kg 119 kg 119 kg    Examination:  General exam: Appears calm and comfortable  Respiratory system: Clear to auscultation. Respiratory effort normal.  Currently on 10 L nasal cannula. Cardiovascular system: S1 & S2 heard, RRR. No JVD, murmurs, rubs, gallops or clicks. No pedal edema. Gastrointestinal system: Abdomen is nondistended, soft and nontender. No organomegaly or masses felt. Normal bowel sounds heard. Central nervous system: Alert and oriented. No focal neurological deficits. Extremities: Symmetric 5 x 5 power. Skin: No rashes, lesions or ulcers Psychiatry: Judgement and insight appear normal. Mood & affect appropriate.  Foley with clear, yellow, urine output.    Data Reviewed: I have personally reviewed following labs and imaging studies  CBC: Recent Labs  Lab 03/02/18 1835 03/02/18 1837 03/03/18 0431 03/04/18 0545  WBC 7.0  --  15.4* 12.7*  NEUTROABS 6.2  --   --   --   HGB 14.9 16.0 16.0 13.4  HCT 46.2 47.0 50.7 42.0  MCV 97.5  --  100.0 97.7  PLT 160  --  184 146*   Basic Metabolic Panel: Recent Labs  Lab 03/02/18 1835 03/02/18 1837 03/03/18 0431 03/04/18 0545 03/05/18 0406  NA 141 142 142 144  --   K 4.2 4.2 5.2* 4.5  --   CL 105 103 107 106  --   CO2 26  --  25 27  --   GLUCOSE 204* 201* 232* 221*  --   BUN 16 17 22  39*  --   CREATININE 0.96 0.90 1.31* 1.89*  --   CALCIUM 9.1  --  9.1 9.0  --   MG 2.1  --   --   --  2.3   GFR: Estimated Creatinine Clearance: 48.3 mL/min (A) (by C-G formula based on SCr of 1.89 mg/dL (H)). Liver Function Tests: Recent Labs  Lab 03/02/18 1835 03/03/18 0431  AST 28 27  ALT 24 22  ALKPHOS 90 96  BILITOT 1.1 1.0  PROT 7.5 7.3  ALBUMIN 4.0 3.7   Recent Labs  Lab 03/02/18 1835  LIPASE 23   No results for input(s): AMMONIA in the last 168 hours. Coagulation Profile: No results for  input(s): INR, PROTIME in the last 168 hours. Cardiac Enzymes: Recent Labs  Lab 03/02/18 1835  TROPONINI <0.03   BNP (last 3 results) No results for input(s): PROBNP in the last 8760 hours. HbA1C: Recent Labs    03/03/18 0431  HGBA1C 6.0*   CBG: Recent Labs  Lab 03/04/18 1631 03/04/18 2035 03/05/18 0043 03/05/18 0434 03/05/18 0809  GLUCAP 221* 234* 256* 196* 202*   Lipid Profile: No results for input(s): CHOL, HDL, LDLCALC, TRIG, CHOLHDL, LDLDIRECT in the last 72 hours. Thyroid Function Tests: No results for input(s): TSH, T4TOTAL, FREET4, T3FREE, THYROIDAB in the last 72 hours. Anemia Panel: No results for input(s): VITAMINB12, FOLATE, FERRITIN, TIBC, IRON, RETICCTPCT in the last 72 hours. Sepsis Labs: Recent Labs  Lab 03/02/18  1856 03/02/18 2120  LATICACIDVEN 0.9 1.6    Recent Results (from the past 240 hour(s))  MRSA PCR Screening     Status: None   Collection Time: 03/03/18  4:02 AM  Result Value Ref Range Status   MRSA by PCR NEGATIVE NEGATIVE Final    Comment:        The GeneXpert MRSA Assay (FDA approved for NASAL specimens only), is one component of a comprehensive MRSA colonization surveillance program. It is not intended to diagnose MRSA infection nor to guide or monitor treatment for MRSA infections. Performed at [facility], [address], [place], [place] [address]   Culture, blood (Routine X 2) w Reflex to ID Panel     Status: None (Preliminary result)   Collection Time: 03/03/18 10:43 AM  Result Value Ref Range Status   Specimen Description BLOOD LEFT WRIST  Final   Special Requests   Final    BOTTLES DRAWN AEROBIC AND ANAEROBIC Blood Culture adequate volume   Culture   Final    NO GROWTH 2 DAYS Performed at [facility], [address], [place], [place] [address]    Report Status PENDING  Incomplete  Culture, blood (Routine X 2) w Reflex to ID Panel     Status: None (Preliminary result)   Collection Time: 03/03/18 10:51 AM  Result  Value Ref Range Status   Specimen Description BLOOD LEFT HAND  Final   Special Requests   Final    BOTTLES DRAWN AEROBIC AND ANAEROBIC Blood Culture adequate volume   Culture   Final    NO GROWTH 2 DAYS Performed at [facility], [address] [place], [place] [address]    Report Status PENDING  Incomplete  Culture, respiratory     Status: None (Preliminary result)   Collection Time: 03/03/18 11:48 AM  Result Value Ref Range Status   Specimen Description   Final    TRACHEAL ASPIRATE Performed at [facility], [address] [place], [place] [address]    Special Requests   Final    NONE Performed at [facility], [address] [place], [place] [address]    Gram Stain   Final    ABUNDANT WBC PRESENT, PREDOMINANTLY PMN RARE GRAM POSITIVE COCCI    Culture   Final    RARE Consistent with normal respiratory flora. Performed at [facility], [address] [place], [place] [address]    Report Status PENDING  Incomplete         Radiology Studies: Portable Chest Xray  Result Date: 03/04/2018 CLINICAL DATA:  Respiratory failure. EXAM: PORTABLE CHEST 1 VIEW COMPARISON:  Radiograph of March 03, 2018. FINDINGS: Stable cardiomegaly. Endotracheal and nasogastric tubes are unchanged in position. No pneumothorax is noted. Stable bibasilar opacities are noted concerning for edema or infiltrates. Small bilateral pleural effusions cannot be excluded. Bony thorax is unremarkable. IMPRESSION: Stable support apparatus. Stable bibasilar opacities as described above. Electronically Signed   By: Lupita Raider, M.D.   On: 03/04/2018 07:31   Dg Chest Port 1v Same Day  Result Date: 03/04/2018 CLINICAL DATA:  Respiratory distress, atrial fibrillation, CHF, COPD, former smoker. EXAM: PORTABLE CHEST 1 VIEW COMPARISON:  Portable chest x-ray of Dec 02, 2017 FINDINGS: The lungs are adequately inflated. The interstitial markings are increased bilaterally. The retrocardiac region on the left and the  right infrahilar regions remain dense. The cardiac silhouette is enlarged and the pulmonary vascularity engorged. The sternal wires are intact. There is calcification in the wall  of the aortic arch. A nasogastric tube is present whose tip projects below the inferior margin of the image. IMPRESSION: CHF with pulmonary interstitial and alveolar edema. Bibasilar atelectasis. One cannot exclude bibasilar pneumonia in the appropriate clinical setting. Thoracic aortic atherosclerosis. Electronically Signed   By: David  Swaziland M.D.   On: 03/04/2018 13:32   Dg Chest Port 1v Same Day  Result Date: 03/03/2018 CLINICAL DATA:  Nasogastric tube placement EXAM: PORTABLE CHEST 1 VIEW COMPARISON:  Portable exam 1355 hours compared 03/03/2018 at 0806 hours FINDINGS: Nasogastric tube coiled in stomach with tip extending below extent of film. Tip of endotracheal tube remains in satisfactory position 5.2 cm above the carina. Enlargement of cardiac silhouette post median sternotomy. Bibasilar infiltrates. No pleural effusion or pneumothorax. IMPRESSION: Nasogastric tube extends into stomach, tip beyond a fear your extent of exam. Enlargement of cardiac silhouette with persistent bibasilar infiltrates. Electronically Signed   By: Ulyses Southward M.D.   On: 03/03/2018 14:14        Scheduled Meds: . carvedilol  6.25 mg Oral BID WC  . colchicine  0.6 mg Oral BID  . insulin aspart  0-15 Units Subcutaneous Q4H  . insulin glargine  70 Units Subcutaneous QHS  . ipratropium-albuterol  3 mL Nebulization Q6H  . mouth rinse  15 mL Mouth Rinse BID  . methylPREDNISolone (SOLU-MEDROL) injection  40 mg Intravenous Q12H  . mupirocin ointment  1 application Topical Daily  . rivaroxaban  20 mg Oral QPM  . sodium chloride flush  3 mL Intravenous Q12H   Continuous Infusions: . sodium chloride    . famotidine (PEPCID) IV 20 mg (03/05/18 0858)  . piperacillin-tazobactam (ZOSYN)  IV 3.375 g (03/05/18 0805)     LOS: 2 days    Time  spent: 30 minutes    Morse Brueggemann Hoover Brunette, DO [facility] Pager [phone]  If 7PM-7AM, please contact night-coverage [email or web address] Password [email or web address] 03/05/2018, 9:29 AM

## 2018-03-05 NOTE — Progress Notes (Signed)
Progress Note  Patient Name: Cody Hale Date of Encounter: 03/05/2018  Primary Cardiologist: Dr. Prentice Docker  Subjective   Patient awake and alert this morning.  He was extubated yesterday.  Reports no chest pain or breathlessness at rest.  Passed swallowing evaluation.  Inpatient Medications    Scheduled Meds: . colchicine  0.6 mg Oral BID  . enoxaparin (LOVENOX) injection  120 mg Subcutaneous Q12H  . insulin aspart  0-15 Units Subcutaneous Q4H  . insulin glargine  70 Units Subcutaneous QHS  . ipratropium-albuterol  3 mL Nebulization Q6H  . mouth rinse  15 mL Mouth Rinse BID  . methylPREDNISolone (SOLU-MEDROL) injection  40 mg Intravenous Q12H  . mupirocin ointment  1 application Topical Daily  . sodium chloride flush  3 mL Intravenous Q12H   Continuous Infusions: . sodium chloride    . diltiazem (CARDIZEM) infusion 10 mg/hr (03/05/18 0457)  . famotidine (PEPCID) IV Stopped (03/04/18 2320)  . piperacillin-tazobactam (ZOSYN)  IV 3.375 g (03/05/18 0805)   PRN Meds: sodium chloride, acetaminophen, fentaNYL (SUBLIMAZE) injection, fentaNYL (SUBLIMAZE) injection, metoCLOPramide (REGLAN) injection, midazolam, midazolam, ondansetron **OR** ondansetron (ZOFRAN) IV, polyethylene glycol, sodium chloride flush, tiZANidine   Vital Signs    Vitals:   03/05/18 0415 03/05/18 0430 03/05/18 0445 03/05/18 0500  BP: (!) 140/59 (!) 130/58 (!) 160/122 (!) 145/68  Pulse: 90 93 98 93  Resp: 18 13 15  (!) 1  Temp:      TempSrc:      SpO2: 96% 97% 97% 96%  Weight:    119 kg  Height:        Intake/Output Summary (Last 24 hours) at 03/05/2018 0828 Last data filed at 03/05/2018 0500 Gross per 24 hour  Intake 1689.59 ml  Output 1150 ml  Net 539.59 ml   Filed Weights   03/03/18 0502 03/04/18 0430 03/05/18 0500  Weight: 124.5 kg 119 kg 119 kg    Telemetry    Atrial fibrillation.  Personally reviewed.  Physical Exam   GEN:  Obese male.  No acute distress.   Neck: No  JVD. Cardiac:  Irregularly irregular, soft systolic murmur, no gallop.  Respiratory:  No wheezing. GI: Soft, nontender, bowel sounds present. MS:  1-2+ leg edema; No deformity. Neuro:  Nonfocal. Psych: Alert and oriented x 3. Normal affect.  Labs    Chemistry Recent Labs  Lab 03/02/18 1835 03/02/18 1837 03/03/18 0431 03/04/18 0545  NA 141 142 142 144  K 4.2 4.2 5.2* 4.5  CL 105 103 107 106  CO2 26  --  25 27  GLUCOSE 204* 201* 232* 221*  BUN 16 17 22  39*  CREATININE 0.96 0.90 1.31* 1.89*  CALCIUM 9.1  --  9.1 9.0  PROT 7.5  --  7.3  --   ALBUMIN 4.0  --  3.7  --   AST 28  --  27  --   ALT 24  --  22  --   ALKPHOS 90  --  96  --   BILITOT 1.1  --  1.0  --   GFRNONAA >60  --  55* 35*  GFRAA >60  --  >60 41*  ANIONGAP 10  --  10 11     Hematology Recent Labs  Lab 03/02/18 1835 03/02/18 1837 03/03/18 0431 03/04/18 0545  WBC 7.0  --  15.4* 12.7*  RBC 4.74  --  5.07 4.30  HGB 14.9 16.0 16.0 13.4  HCT 46.2 47.0 50.7 42.0  MCV 97.5  --  100.0 97.7  MCH 31.4  --  31.6 31.2  MCHC 32.3  --  31.6 31.9  RDW 14.6  --  14.9 15.1  PLT 160  --  184 146*    Cardiac Enzymes Recent Labs  Lab 03/02/18 1835  TROPONINI <0.03    Recent Labs  Lab 03/02/18 1836  TROPIPOC 0.01     BNP Recent Labs  Lab 03/02/18 1835  BNP 435.0*     Radiology    Portable Chest Xray  Result Date: 03/04/2018 CLINICAL DATA:  Respiratory failure. EXAM: PORTABLE CHEST 1 VIEW COMPARISON:  Radiograph of March 03, 2018. FINDINGS: Stable cardiomegaly. Endotracheal and nasogastric tubes are unchanged in position. No pneumothorax is noted. Stable bibasilar opacities are noted concerning for edema or infiltrates. Small bilateral pleural effusions cannot be excluded. Bony thorax is unremarkable. IMPRESSION: Stable support apparatus. Stable bibasilar opacities as described above. Electronically Signed   By: Lupita Raider, M.D.   On: 03/04/2018 07:31   Dg Chest Port 1v Same Day  Result Date:  03/04/2018 CLINICAL DATA:  Respiratory distress, atrial fibrillation, CHF, COPD, former smoker. EXAM: PORTABLE CHEST 1 VIEW COMPARISON:  Portable chest x-ray of Dec 02, 2017 FINDINGS: The lungs are adequately inflated. The interstitial markings are increased bilaterally. The retrocardiac region on the left and the right infrahilar regions remain dense. The cardiac silhouette is enlarged and the pulmonary vascularity engorged. The sternal wires are intact. There is calcification in the wall of the aortic arch. A nasogastric tube is present whose tip projects below the inferior margin of the image. IMPRESSION: CHF with pulmonary interstitial and alveolar edema. Bibasilar atelectasis. One cannot exclude bibasilar pneumonia in the appropriate clinical setting. Thoracic aortic atherosclerosis. Electronically Signed   By: David  Swaziland M.D.   On: 03/04/2018 13:32   Dg Chest Port 1v Same Day  Result Date: 03/03/2018 CLINICAL DATA:  Nasogastric tube placement EXAM: PORTABLE CHEST 1 VIEW COMPARISON:  Portable exam 1355 hours compared 03/03/2018 at 0806 hours FINDINGS: Nasogastric tube coiled in stomach with tip extending below extent of film. Tip of endotracheal tube remains in satisfactory position 5.2 cm above the carina. Enlargement of cardiac silhouette post median sternotomy. Bibasilar infiltrates. No pleural effusion or pneumothorax. IMPRESSION: Nasogastric tube extends into stomach, tip beyond a fear your extent of exam. Enlargement of cardiac silhouette with persistent bibasilar infiltrates. Electronically Signed   By: Ulyses Southward M.D.   On: 03/03/2018 14:14    Cardiac Studies   Echocardiogram 12/02/2017: Study Conclusions  - Left ventricle: The cavity size was normal. Wall thickness was   increased in a pattern of moderate LVH. Images are limited even   with use of Definity contrast. Systolic function was moderately   to severely reduced. The estimated ejection fraction was in the   range of 30% to  35%. There is dyskinesis of the   mid-apicalanteroseptal myocardium. The study was not technically   sufficient to allow evaluation of LV diastolic dysfunction due to   atrial fibrillation. - Aortic valve: Mildly calcified annulus. Trileaflet; mildly   calcified leaflets. - Mitral valve: St. 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   There was no significant regurgitation or perivalvular   regurgitation. Valve area by pressure half-time: 1.58 cm^2. - Left atrium: The atrium was mildly dilated. - Right atrium: Central venous pressure (est): 3 mm Hg. - Atrial septum: No defect or patent foramen ovale was identified. - Tricuspid valve: There was trivial regurgitation. - Pulmonary arteries: Systolic pressure could not be  accurately   estimated. - Pericardium, extracardiac: There was no pericardial effusion.  Patient Profile     67 y.o. 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  He is admitted with hypoxic/hypercarbic respiratory failure, possible aspiration pneumonia, requiring mechanical ventilation.  Assessment & Plan    1.  Permanent atrial fibrillation.  Now that patient is extubated and able to take oral medications, we will convert back to Coreg for heart rate control and Xarelto for stroke prophylaxis.  2.  Acute on chronic combined heart failure.  Still has evidence of fluid overload.  Lasix was held yesterday afternoon with rising creatinine.  3.  Bioprosthetic mitral valve replacement in 2014, valve function normal by echocardiogram in May.  4.  Hypoxic/hypercarbic respiratory failure with suspected aspiration pneumonia.  Followed by Dr. Juanetta Gosling.  Patient extubated yesterday.  5.  Acute renal insufficiency, last creatinine 1.89.  Await follow-up BMET.  Initiate Coreg at  home dose as well as Xarelto, stop IV diltiazem and Lovenox.  Hold off on resuming Entresto in light of renal insufficiency and need to continue diuretics.  Follow-up BMET first and then we will determine diuretic dose.  Signed, Nona Dell, MD  03/05/2018, 8:28 AM

## 2018-03-05 NOTE — Progress Notes (Signed)
Subjective: He says he feels okay.  He was extubated yesterday.  He is not having any pain.  He is still coughing up some sputum and this morning it has blood in it.  His stomach is doing well.  Objective: Vital signs in last 24 hours: Temp:  [97.4 F (36.3 C)-98.1 F (36.7 C)] 97.4 F (36.3 C) (08/29 0800) Pulse Rate:  [78-143] 93 (08/29 0500) Resp:  [1-25] 1 (08/29 0500) BP: (77-187)/(50-155) 145/68 (08/29 0500) SpO2:  [67 %-100 %] 96 % (08/29 0500) Weight:  [161 kg] 119 kg (08/29 0500) Weight change: 0 kg Last BM Date: (unknown)  Intake/Output from previous day: 08/28 0701 - 08/29 0700 In: 1689.6 [P.O.:480; IV Piggyback:1209.6] Out: 1150 [Urine:1150]  PHYSICAL EXAM General appearance: alert, cooperative and no distress Resp: rhonchi bilaterally Cardio: irregularly irregular rhythm GI: soft, non-tender; bowel sounds normal; no masses,  no organomegaly Extremities: extremities normal, atraumatic, no cyanosis or edema  Lab Results:  Results for orders placed or performed during the hospital encounter of 03/02/18 (from the past 48 hour(s))  Draw ABG 1 hour after initiation of ventilator     Status: Abnormal   Collection Time: 03/03/18  9:30 AM  Result Value Ref Range   FIO2 1.00    Delivery systems VENTILATOR    Mode PRESSURE REGULATED VOLUME CONTROL    VT 620 mL   LHR 16 resp/min   Peep/cpap 8.0 cm H20   pH, Arterial 7.340 (L) 7.350 - 7.450   pCO2 arterial 47.9 32.0 - 48.0 mmHg   pO2, Arterial 100 83.0 - 108.0 mmHg   Bicarbonate 23.8 20.0 - 28.0 mmol/L   Acid-Base Excess 0.1 0.0 - 2.0 mmol/L   O2 Saturation 97.8 %   Patient temperature 37.0    Collection site LEFT RADIAL    Sample type 096,045    Allens test (pass/fail) PASS PASS    Comment: Performed at [facility], [address], [place], [place] [address]  Culture, blood (Routine X 2) w Reflex to ID Panel     Status: None (Preliminary result)   Collection Time: 03/03/18 10:43 AM  Result Value Ref Range    Specimen Description BLOOD LEFT WRIST    Special Requests      BOTTLES DRAWN AEROBIC AND ANAEROBIC Blood Culture adequate volume   Culture      NO GROWTH 2 DAYS Performed at [facility], [address] [place], [place] [address]    Report Status PENDING   Culture, blood (Routine X 2) w Reflex to ID Panel     Status: None (Preliminary result)   Collection Time: 03/03/18 10:51 AM  Result Value Ref Range   Specimen Description BLOOD LEFT HAND    Special Requests      BOTTLES DRAWN AEROBIC AND ANAEROBIC Blood Culture adequate volume   Culture      NO GROWTH 2 DAYS Performed at [facility], [address] [place], [place] [address]    Report Status PENDING   Culture, respiratory     Status: None (Preliminary result)   Collection Time: 03/03/18 11:48 AM  Result Value Ref Range   Specimen Description      TRACHEAL ASPIRATE Performed at [facility], [address], [place], [place] [address]    Special Requests      NONE Performed at [facility], [address] [place], [place] [address]    Gram Stain      ABUNDANT WBC PRESENT, PREDOMINANTLY PMN RARE GRAM POSITIVE COCCI    Culture  RARE Consistent with normal respiratory flora. Performed at [facility], [place] [address] [place], [place] [address]    Report Status PENDING   Glucose, capillary     Status: Abnormal   Collection Time: 03/03/18  1:03 PM  Result Value Ref Range   Glucose-Capillary 194 (H) 70 - 99 mg/dL  Glucose, capillary     Status: Abnormal   Collection Time: 03/03/18  4:41 PM  Result Value Ref Range   Glucose-Capillary 205 (H) 70 - 99 mg/dL  Glucose, capillary     Status: Abnormal   Collection Time: 03/03/18  7:58 PM  Result Value Ref Range   Glucose-Capillary 184 (H) 70 - 99 mg/dL   Comment 1 Notify RN    Comment 2 Document in Chart   Glucose, capillary     Status: Abnormal   Collection Time: 03/04/18 12:09 AM  Result Value Ref Range   Glucose-Capillary 188 (H) 70 - 99 mg/dL  Glucose,  capillary     Status: Abnormal   Collection Time: 03/04/18  4:14 AM  Result Value Ref Range   Glucose-Capillary 186 (H) 70 - 99 mg/dL  Basic metabolic panel     Status: Abnormal   Collection Time: 03/04/18  5:45 AM  Result Value Ref Range   Sodium 144 135 - 145 mmol/L   Potassium 4.5 3.5 - 5.1 mmol/L   Chloride 106 98 - 111 mmol/L   CO2 27 22 - 32 mmol/L   Glucose, Bld 221 (H) 70 - 99 mg/dL   BUN 39 (H) 8 - 23 mg/dL   Creatinine, Ser 1.89 (H) 0.61 - 1.24 mg/dL   Calcium 9.0 8.9 - 10.3 mg/dL   GFR calc non Af Amer 35 (L) >60 mL/min   GFR calc Af Amer 41 (L) >60 mL/min    Comment: (NOTE) The eGFR has been calculated using the CKD EPI equation. This calculation has not been validated in all clinical situations. eGFR's persistently <60 mL/min signify possible Chronic Kidney Disease.    Anion gap 11 5 - 15    Comment: Performed at [facility], [address], [place], [place] [address]  CBC     Status: Abnormal   Collection Time: 03/04/18  5:45 AM  Result Value Ref Range   WBC 12.7 (H) 4.0 - 10.5 K/uL   RBC 4.30 4.22 - 5.81 MIL/uL   Hemoglobin 13.4 13.0 - 17.0 g/dL   HCT 42.0 39.0 - 52.0 %   MCV 97.7 78.0 - 100.0 fL   MCH 31.2 26.0 - 34.0 pg   MCHC 31.9 30.0 - 36.0 g/dL   RDW 15.1 11.5 - 15.5 %   Platelets 146 (L) 150 - 400 K/uL    Comment: SPECIMEN CHECKED FOR CLOTS PLATELET COUNT CONFIRMED BY SMEAR Performed at [facility], [address], [place], [place] [address]   Glucose, capillary     Status: Abnormal   Collection Time: 03/04/18  7:58 AM  Result Value Ref Range   Glucose-Capillary 175 (H) 70 - 99 mg/dL  Glucose, capillary     Status: Abnormal   Collection Time: 03/04/18 11:26 AM  Result Value Ref Range   Glucose-Capillary 210 (H) 70 - 99 mg/dL  Glucose, capillary     Status: Abnormal   Collection Time: 03/04/18  4:31 PM  Result Value Ref Range   Glucose-Capillary 221 (H) 70 - 99 mg/dL  Glucose, capillary     Status: Abnormal   Collection Time: 03/05/18 12:43  AM  Result Value Ref  Range   Glucose-Capillary 256 (H) 70 - 99 mg/dL   Comment 1 Notify RN   Magnesium     Status: None   Collection Time: 03/05/18  4:06 AM  Result Value Ref Range   Magnesium 2.3 1.7 - 2.4 mg/dL    Comment: Performed at [facility], [address], [place], [place] [address]  Glucose, capillary     Status: Abnormal   Collection Time: 03/05/18  4:34 AM  Result Value Ref Range   Glucose-Capillary 196 (H) 70 - 99 mg/dL  Glucose, capillary     Status: Abnormal   Collection Time: 03/05/18  8:09 AM  Result Value Ref Range   Glucose-Capillary 202 (H) 70 - 99 mg/dL    ABGS Recent Labs    03/02/18 1837  03/03/18 0930  PHART  --    < > 7.340*  PO2ART  --    < > 100  TCO2 26  --   --   HCO3  --    < > 23.8   < > = values in this interval not displayed.   CULTURES Recent Results (from the past 240 hour(s))  MRSA PCR Screening     Status: None   Collection Time: 03/03/18  4:02 AM  Result Value Ref Range Status   MRSA by PCR NEGATIVE NEGATIVE Final    Comment:        The GeneXpert MRSA Assay (FDA approved for NASAL specimens only), is one component of a comprehensive MRSA colonization surveillance program. It is not intended to diagnose MRSA infection nor to guide or monitor treatment for MRSA infections. Performed at [facility], [address], [place], [place] [address]   Culture, blood (Routine X 2) w Reflex to ID Panel     Status: None (Preliminary result)   Collection Time: 03/03/18 10:43 AM  Result Value Ref Range Status   Specimen Description BLOOD LEFT WRIST  Final   Special Requests   Final    BOTTLES DRAWN AEROBIC AND ANAEROBIC Blood Culture adequate volume   Culture   Final    NO GROWTH 2 DAYS Performed at [facility], [address] [place], [place] [address]    Report Status PENDING  Incomplete  Culture, blood (Routine X 2) w Reflex to ID Panel     Status: None (Preliminary result)   Collection Time: 03/03/18 10:51 AM  Result Value Ref  Range Status   Specimen Description BLOOD LEFT HAND  Final   Special Requests   Final    BOTTLES DRAWN AEROBIC AND ANAEROBIC Blood Culture adequate volume   Culture   Final    NO GROWTH 2 DAYS Performed at [facility], [address], [place], [place] [address]    Report Status PENDING  Incomplete  Culture, respiratory     Status: None (Preliminary result)   Collection Time: 03/03/18 11:48 AM  Result Value Ref Range Status   Specimen Description   Final    TRACHEAL ASPIRATE Performed at [facility], [address] [place], [place] [address]    Special Requests   Final    NONE Performed at [facility], [address], [place], [place] [address]    Gram Stain   Final    ABUNDANT WBC PRESENT, PREDOMINANTLY PMN RARE GRAM POSITIVE COCCI    Culture   Final    RARE Consistent with normal respiratory flora. Performed at [facility], [place] [address], [place],  [address]    Report Status PENDING  Incomplete   Studies/Results: Portable Chest Xray  Result Date: 03/04/2018 CLINICAL DATA:  Respiratory failure. EXAM: PORTABLE CHEST 1 VIEW COMPARISON:  Radiograph of March 03, 2018. FINDINGS: Stable cardiomegaly. Endotracheal and nasogastric tubes are unchanged in position. No pneumothorax is noted. Stable bibasilar opacities are noted concerning for edema or infiltrates. Small bilateral pleural effusions cannot be excluded. Bony thorax is unremarkable. IMPRESSION: Stable support apparatus. Stable bibasilar opacities as described above. Electronically Signed   By: Marijo Conception, M.D.   On: 03/04/2018 07:31   Dg Chest Port 1v Same Day  Result Date: 03/04/2018 CLINICAL DATA:  Respiratory distress, atrial fibrillation, CHF, COPD, former smoker. EXAM: PORTABLE CHEST 1 VIEW COMPARISON:  Portable chest x-ray of Dec 02, 2017 FINDINGS: The lungs are adequately inflated. The interstitial markings are increased bilaterally. The retrocardiac region on the left and the right infrahilar  regions remain dense. The cardiac silhouette is enlarged and the pulmonary vascularity engorged. The sternal wires are intact. There is calcification in the wall of the aortic arch. A nasogastric tube is present whose tip projects below the inferior margin of the image. IMPRESSION: CHF with pulmonary interstitial and alveolar edema. Bibasilar atelectasis. One cannot exclude bibasilar pneumonia in the appropriate clinical setting. Thoracic aortic atherosclerosis. Electronically Signed   By: David  Martinique M.D.   On: 03/04/2018 13:32   Dg Chest Port 1v Same Day  Result Date: 03/03/2018 CLINICAL DATA:  Nasogastric tube placement EXAM: PORTABLE CHEST 1 VIEW COMPARISON:  Portable exam 1355 hours compared 03/03/2018 at 0806 hours FINDINGS: Nasogastric tube coiled in stomach with tip extending below extent of film. Tip of endotracheal tube remains in satisfactory position 5.2 cm above the carina. Enlargement of cardiac silhouette post median sternotomy. Bibasilar infiltrates. No pleural effusion or pneumothorax. IMPRESSION: Nasogastric tube extends into stomach, tip beyond a fear your extent of exam. Enlargement of cardiac silhouette with persistent bibasilar infiltrates. Electronically Signed   By: Lavonia Dana M.D.   On: 03/03/2018 14:14    Medications:  Prior to Admission:  Medications Prior to Admission  Medication Sig Dispense Refill Last Dose  . carvedilol (COREG) 6.25 MG tablet TAKE 1 TABLET BY MOUTH TWICE DAILY. (Patient taking differently: Take 6.25 mg by mouth 2 (two) times daily with a meal. ) 60 tablet 0 03/02/2018 at 1700  . colchicine 0.6 MG tablet Take 0.6 mg by mouth 2 (two) times daily.   03/01/2018 at Unknown time  . diphenoxylate-atropine (LOMOTIL) 2.5-0.025 MG tablet Take 1 tablet by mouth 4 (four) times daily as needed for diarrhea or loose stools. 45 tablet 0 unknown  . fluticasone (FLONASE) 50 MCG/ACT nasal spray Place 2 sprays into both nostrils daily. 16 g 5 03/01/2018 at Unknown time  .  guaiFENesin (ROBITUSSIN) 100 MG/5ML liquid Take 100 mg by mouth 3 (three) times daily as needed for cough.   unknown  . insulin aspart (NOVOLOG) 100 UNIT/ML injection Inject 5 Units into the skin 2 (two) times daily. Inject 5 units subcutaneously twice daily before lunch and supper hold if BX <150 same as human insulin   03/01/2018 at Unknown time  . INVOKANA 100 MG TABS tablet TAKE ONE TABLET BY MOUTH ONCE DAILY BEFORE BREAKFAST. (Patient taking differently: Take 100 mg by mouth daily. ) 30 tablet 0 03/01/2018 at Unknown time  . lactobacillus acidophilus (BACID) TABS tablet Take 1 tablet by mouth 2 (two) times daily.    03/01/2018 at Unknown time  . LANTUS SOLOSTAR 100 UNIT/ML Solostar Pen INJECT 70 UNITS SUBCUTANEOUSLY AT  10pm. (Patient taking differently: Inject 70 Units into the skin at bedtime. ) 15 mL 3 03/01/2018 at Unknown time  . levocetirizine (XYZAL) 5 MG tablet Take 1 tablet (5 mg total) by mouth every evening. 90 tablet 1 03/01/2018 at Unknown time  . metFORMIN (GLUCOPHAGE-XR) 750 MG 24 hr tablet TAKE 2 TABLETS BY MOUTH EACH MORNING WITH BREAKFAST. (Patient taking differently: Take 750 mg by mouth daily with breakfast. ) 60 tablet 5 03/01/2018 at Unknown time  . methocarbamol (ROBAXIN) 500 MG tablet Take 1 tablet (500 mg total) by mouth every 6 (six) hours as needed for muscle spasms. 60 tablet 1 Past Month at Unknown time  . mupirocin ointment (BACTROBAN) 2 % PLACE A SMALL AMOUT TO TOP OF INCITION AT RIGHT HIP & DRY DRESSING DAILY. (Patient taking differently: Apply 1 application topically daily. Applied to right hip and dry dressing daily) 22 g 0 03/01/2018 at Unknown time  . nitroGLYCERIN (NITROSTAT) 0.4 MG SL tablet Place 1 tablet (0.4 mg total) under the tongue every 5 (five) minutes as needed for chest pain. Max 3 doses. 10 tablet 0 unknown  . ondansetron (ZOFRAN) 4 MG tablet Take 4 mg by mouth every 8 (eight) hours as needed for nausea or vomiting.   unknown  . oxyCODONE (OXY IR/ROXICODONE) 5  MG immediate release tablet Take 1-2 tablets (5-10 mg total) by mouth every 4 (four) hours as needed for moderate pain (pain score 4-6). 30 tablet 0 Past Week at Unknown time  . polyethylene glycol (MIRALAX / GLYCOLAX) packet Take 17 g by mouth daily as needed.   unknown  . potassium chloride SA (K-DUR,KLOR-CON) 20 MEQ tablet Take 20 mEq by mouth daily.   03/01/2018 at Unknown time  . Probiotic Product (RISA-BID PROBIOTIC) TABS Take 1 tablet by mouth 2 (two) times daily.   03/02/2018 at Unknown time  . sacubitril-valsartan (ENTRESTO) 24-26 MG Take 1 tablet by mouth 2 (two) times daily. 60 tablet 6 03/01/2018 at Unknown time  . simvastatin (ZOCOR) 20 MG tablet TAKE 1 TABLET BY MOUTH ONCE DAILY. (Patient taking differently: Take 20 mg by mouth every evening. ) 30 tablet 5 03/01/2018 at Unknown time  . tiZANidine (ZANAFLEX) 4 MG tablet Take 1 tablet (4 mg total) by mouth every 8 (eight) hours as needed for muscle spasms. 60 tablet 0 unknown  . torsemide (DEMADEX) 20 MG tablet Take 20 mg by mouth 2 (two) times daily.   03/02/2018 at 1700  . VIIBRYD 40 MG TABS TAKE 1 TABLET BY MOUTH ONCE DAILY. (Patient taking differently: Take 40 mg by mouth daily. ) 30 tablet 0 03/01/2018 at Unknown time  . XARELTO 20 MG TABS tablet TAKE 1 TABLET BY MOUTH ONCE DAILY. (Patient taking differently: Take 20 mg by mouth every evening. ) 30 tablet 5 03/01/2018 at 1700  . Multiple Vitamins-Minerals (THERA-M) TABS TAKE 1 TABLET BY MOUTH ONCE DAILY. (Patient not taking: Reported on 03/02/2018) 90 tablet 1 Not Taking at Unknown time   Scheduled: . carvedilol  6.25 mg Oral BID WC  . colchicine  0.6 mg Oral BID  . insulin aspart  0-15 Units Subcutaneous Q4H  . insulin glargine  70 Units Subcutaneous QHS  . ipratropium-albuterol  3 mL Nebulization Q6H  . mouth rinse  15 mL Mouth Rinse BID  . methylPREDNISolone (SOLU-MEDROL) injection  40 mg Intravenous Q12H  . mupirocin ointment  1 application Topical Daily  . rivaroxaban  20 mg Oral  QPM  . sodium chloride flush  3 mL Intravenous  Q12H   Continuous: . sodium chloride    . famotidine (PEPCID) IV Stopped (03/04/18 2320)  . piperacillin-tazobactam (ZOSYN)  IV 3.375 g (03/05/18 0805)   UMP:NTIRWE chloride, acetaminophen, fentaNYL (SUBLIMAZE) injection, fentaNYL (SUBLIMAZE) injection, metoCLOPramide (REGLAN) injection, midazolam, midazolam, ondansetron **OR** ondansetron (ZOFRAN) IV, polyethylene glycol, sodium chloride flush, tiZANidine  Assesment: He was admitted with acute hypoxic and hypercapnic respiratory failure.  He has aspiration pneumonia.  He initially required intubation and mechanical ventilation but he was extubated yesterday partially because he had a mucous plug.  He is tolerated this well.  He is on treatment for pneumonia.  He is still requiring 8 L oxygen.  He does not have any nausea now  He had atrial fib with rapid ventricular response and he is on diltiazem drip. Principal Problem:   Acute respiratory failure with hypoxia and hypercapnia (HCC) Active Problems:   Type II diabetes mellitus with manifestations (HCC)   Chronic atrial fibrillation (HCC)   Acute respiratory failure ([place])    Plan: Continue treatments.  Continue antibiotics steroids oxygen nebulizer treatments.    LOS: 2 days   Matie Dimaano L 03/05/2018, 8:47 AM

## 2018-03-06 DIAGNOSIS — I428 Other cardiomyopathies: Secondary | ICD-10-CM

## 2018-03-06 LAB — BASIC METABOLIC PANEL
Anion gap: 4 — ABNORMAL LOW (ref 5–15)
BUN: 30 mg/dL — ABNORMAL HIGH (ref 8–23)
CO2: 32 mmol/L (ref 22–32)
Calcium: 9 mg/dL (ref 8.9–10.3)
Chloride: 105 mmol/L (ref 98–111)
Creatinine, Ser: 0.91 mg/dL (ref 0.61–1.24)
GFR calc Af Amer: >60 mL/min (ref >60)
GFR calc non Af Amer: >60 mL/min (ref >60)
Glucose, Bld: 155 mg/dL — ABNORMAL HIGH (ref 70–99)
Potassium: 4.4 mmol/L (ref 3.5–5.1)
Sodium: 141 mmol/L (ref 135–145)

## 2018-03-06 LAB — CBC
HCT: 38 % — ABNORMAL LOW (ref 39.0–52.0)
Hemoglobin: 11.9 g/dL — ABNORMAL LOW (ref 13.0–17.0)
MCH: 30.7 pg (ref 26.0–34.0)
MCHC: 31.3 g/dL (ref 30.0–36.0)
MCV: 98.2 fL (ref 78.0–100.0)
Platelets: 122 10*3/uL — ABNORMAL LOW (ref 150–400)
RBC: 3.87 MIL/uL — ABNORMAL LOW (ref 4.22–5.81)
RDW: 14.5 % (ref 11.5–15.5)
WBC: 8 10*3/uL (ref 4.0–10.5)

## 2018-03-06 LAB — GLUCOSE, CAPILLARY
Glucose-Capillary: 120 mg/dL — ABNORMAL HIGH (ref 70–99)
Glucose-Capillary: 138 mg/dL — ABNORMAL HIGH (ref 70–99)
Glucose-Capillary: 147 mg/dL — ABNORMAL HIGH (ref 70–99)
Glucose-Capillary: 174 mg/dL — ABNORMAL HIGH (ref 70–99)
Glucose-Capillary: 177 mg/dL — ABNORMAL HIGH (ref 70–99)
Glucose-Capillary: 191 mg/dL — ABNORMAL HIGH (ref 70–99)

## 2018-03-06 MED ORDER — AMOXICILLIN-POT CLAVULANATE 875-125 MG PO TABS
1.0000 | ORAL_TABLET | Freq: Two times a day (BID) | ORAL | Status: DC
  Administered 2018-03-06 – 2018-03-08 (×5): 1 via ORAL
  Filled 2018-03-06 (×5): qty 1

## 2018-03-06 MED ORDER — SIMVASTATIN 20 MG PO TABS
20.0000 mg | ORAL_TABLET | Freq: Every day | ORAL | Status: DC
  Administered 2018-03-06 – 2018-03-07 (×2): 20 mg via ORAL
  Filled 2018-03-06 (×2): qty 1

## 2018-03-06 MED ORDER — SACUBITRIL-VALSARTAN 24-26 MG PO TABS
1.0000 | ORAL_TABLET | Freq: Two times a day (BID) | ORAL | Status: DC
  Administered 2018-03-06 – 2018-03-08 (×5): 1 via ORAL
  Filled 2018-03-06 (×7): qty 1

## 2018-03-06 NOTE — Progress Notes (Signed)
Subjective: He feels much better.  No new complaints.  His heart rate is controlled.  He is been able to come down to 5 L nasal cannula.  He is still coughing up some sputum and still feels mildly short of breath.  Objective: Vital signs in last 24 hours: Temp:  [97.6 F (36.4 C)-98.2 F (36.8 C)] 97.6 F (36.4 C) (08/30 0831) Pulse Rate:  [61-91] 61 (08/30 0745) Resp:  [10-21] 16 (08/30 0745) BP: (114-191)/(57-152) 149/70 (08/30 0600) SpO2:  [91 %-98 %] 97 % (08/30 0745) Weight change:  Last BM Date: (unknown)  Intake/Output from previous day: 08/29 0701 - 08/30 0700 In: 830 [P.O.:240; I.V.:351; IV Piggyback:239] Out: 1900 [Urine:1900]  PHYSICAL EXAM General appearance: alert, cooperative and no distress Resp: rhonchi bilaterally Cardio: irregularly irregular rhythm GI: soft, non-tender; bowel sounds normal; no masses,  no organomegaly Extremities: extremities normal, atraumatic, no cyanosis or edema  Lab Results:  Results for orders placed or performed during the hospital encounter of 03/02/18 (from the past 48 hour(s))  Glucose, capillary     Status: Abnormal   Collection Time: 03/04/18 11:26 AM  Result Value Ref Range   Glucose-Capillary 210 (H) 70 - 99 mg/dL  Glucose, capillary     Status: Abnormal   Collection Time: 03/04/18  4:31 PM  Result Value Ref Range   Glucose-Capillary 221 (H) 70 - 99 mg/dL  Glucose, capillary     Status: Abnormal   Collection Time: 03/04/18  8:35 PM  Result Value Ref Range   Glucose-Capillary 234 (H) 70 - 99 mg/dL   Comment 1 Notify RN   Glucose, capillary     Status: Abnormal   Collection Time: 03/05/18 12:43 AM  Result Value Ref Range   Glucose-Capillary 256 (H) 70 - 99 mg/dL   Comment 1 Notify RN   Magnesium     Status: None   Collection Time: 03/05/18  4:06 AM  Result Value Ref Range   Magnesium 2.3 1.7 - 2.4 mg/dL    Comment: Performed at [facility], [address], [place], [place] [address]  Basic metabolic panel      Status: Abnormal   Collection Time: 03/05/18  4:06 AM  Result Value Ref Range   Sodium 145 135 - 145 mmol/L   Potassium 4.3 3.5 - 5.1 mmol/L   Chloride 105 98 - 111 mmol/L   CO2 26 22 - 32 mmol/L   Glucose, Bld 216 (H) 70 - 99 mg/dL   BUN 41 (H) 8 - 23 mg/dL   Creatinine, Ser 1.25 (H) 0.61 - 1.24 mg/dL   Calcium 9.2 8.9 - 10.3 mg/dL   GFR calc non Af Amer 58 (L) >60 mL/min   GFR calc Af Amer >60 >60 mL/min    Comment: (NOTE) The eGFR has been calculated using the CKD EPI equation. This calculation has not been validated in all clinical situations. eGFR's persistently <60 mL/min signify possible Chronic Kidney Disease.    Anion gap 14 5 - 15    Comment: Performed at [facility], [address], [place], [place] [address]  Glucose, capillary     Status: Abnormal   Collection Time: 03/05/18  4:34 AM  Result Value Ref Range   Glucose-Capillary 196 (H) 70 - 99 mg/dL  Glucose, capillary     Status: Abnormal   Collection Time: 03/05/18  8:09 AM  Result Value Ref Range   Glucose-Capillary 202 (H) 70 - 99 mg/dL  Glucose, capillary     Status: Abnormal  Collection Time: 03/05/18 11:29 AM  Result Value Ref Range   Glucose-Capillary 190 (H) 70 - 99 mg/dL  Glucose, capillary     Status: Abnormal   Collection Time: 03/05/18  4:33 PM  Result Value Ref Range   Glucose-Capillary 189 (H) 70 - 99 mg/dL   Comment 1 Notify RN    Comment 2 Document in Chart   Glucose, capillary     Status: Abnormal   Collection Time: 03/05/18  7:56 PM  Result Value Ref Range   Glucose-Capillary 190 (H) 70 - 99 mg/dL   Comment 1 Notify RN    Comment 2 Document in Chart   Glucose, capillary     Status: Abnormal   Collection Time: 03/06/18 12:05 AM  Result Value Ref Range   Glucose-Capillary 191 (H) 70 - 99 mg/dL   Comment 1 Notify RN    Comment 2 Document in Chart   Glucose, capillary     Status: Abnormal   Collection Time: 03/06/18  4:07 AM  Result Value Ref Range   Glucose-Capillary 138 (H) 70 - 99  mg/dL   Comment 1 Notify RN    Comment 2 Document in Chart   CBC     Status: Abnormal   Collection Time: 03/06/18  4:13 AM  Result Value Ref Range   WBC 8.0 4.0 - 10.5 K/uL   RBC 3.87 (L) 4.22 - 5.81 MIL/uL   Hemoglobin 11.9 (L) 13.0 - 17.0 g/dL   HCT 38.0 (L) 39.0 - 52.0 %   MCV 98.2 78.0 - 100.0 fL   MCH 30.7 26.0 - 34.0 pg   MCHC 31.3 30.0 - 36.0 g/dL   RDW 14.5 11.5 - 15.5 %   Platelets 122 (L) 150 - 400 K/uL    Comment: Performed at [facility], [address], [place], [place] [address]  Basic metabolic panel     Status: Abnormal   Collection Time: 03/06/18  4:13 AM  Result Value Ref Range   Sodium 141 135 - 145 mmol/L   Potassium 4.4 3.5 - 5.1 mmol/L   Chloride 105 98 - 111 mmol/L   CO2 32 22 - 32 mmol/L   Glucose, Bld 155 (H) 70 - 99 mg/dL   BUN 30 (H) 8 - 23 mg/dL   Creatinine, Ser 0.91 0.61 - 1.24 mg/dL   Calcium 9.0 8.9 - 10.3 mg/dL   GFR calc non Af Amer >60 >60 mL/min   GFR calc Af Amer >60 >60 mL/min    Comment: (NOTE) The eGFR has been calculated using the CKD EPI equation. This calculation has not been validated in all clinical situations. eGFR's persistently <60 mL/min signify possible Chronic Kidney Disease.    Anion gap 4 (L) 5 - 15    Comment: Performed at [facility], [address], [place], [place] [address]  Glucose, capillary     Status: Abnormal   Collection Time: 03/06/18  8:11 AM  Result Value Ref Range   Glucose-Capillary 120 (H) 70 - 99 mg/dL    ABGS Recent Labs    03/03/18 0930  PHART 7.340*  PO2ART 100  HCO3 23.8   CULTURES Recent Results (from the past 240 hour(s))  MRSA PCR Screening     Status: None   Collection Time: 03/03/18  4:02 AM  Result Value Ref Range Status   MRSA by PCR NEGATIVE NEGATIVE Final    Comment:        The GeneXpert MRSA Assay (FDA approved for NASAL specimens only), is one  component of a comprehensive MRSA colonization surveillance program. It is not intended to diagnose MRSA infection nor to guide  or monitor treatment for MRSA infections. Performed at [facility], [address] [place], [place] [address]   Culture, blood (Routine X 2) w Reflex to ID Panel     Status: None (Preliminary result)   Collection Time: 03/03/18 10:43 AM  Result Value Ref Range Status   Specimen Description BLOOD LEFT WRIST  Final   Special Requests   Final    BOTTLES DRAWN AEROBIC AND ANAEROBIC Blood Culture adequate volume   Culture   Final    NO GROWTH 3 DAYS Performed at [facility], [address], [place], [place] [address]    Report Status PENDING  Incomplete  Culture, blood (Routine X 2) w Reflex to ID Panel     Status: None (Preliminary result)   Collection Time: 03/03/18 10:51 AM  Result Value Ref Range Status   Specimen Description BLOOD LEFT HAND  Final   Special Requests   Final    BOTTLES DRAWN AEROBIC AND ANAEROBIC Blood Culture adequate volume   Culture   Final    NO GROWTH 3 DAYS Performed at [facility], [address] [place], [place] [address]    Report Status PENDING  Incomplete  Culture, respiratory     Status: None   Collection Time: 03/03/18 11:48 AM  Result Value Ref Range Status   Specimen Description   Final    TRACHEAL ASPIRATE Performed at [facility], [address], [place], [place] [address]    Special Requests   Final    NONE Performed at [facility] [address], [place], [place] [address]    Gram Stain   Final    ABUNDANT WBC PRESENT, PREDOMINANTLY PMN RARE GRAM POSITIVE COCCI    Culture   Final    RARE Consistent with normal respiratory flora. Performed at [facility], [place] [address] [place], [place] [address]    Report Status 03/05/2018 FINAL  Final   Studies/Results: Dg Chest Port 1v Same Day  Result Date: 03/04/2018 CLINICAL DATA:  Respiratory distress, atrial fibrillation, CHF, COPD, former smoker. EXAM: PORTABLE CHEST 1 VIEW COMPARISON:  Portable chest x-ray of Dec 02, 2017 FINDINGS: The lungs are adequately inflated. The  interstitial markings are increased bilaterally. The retrocardiac region on the left and the right infrahilar regions remain dense. The cardiac silhouette is enlarged and the pulmonary vascularity engorged. The sternal wires are intact. There is calcification in the wall of the aortic arch. A nasogastric tube is present whose tip projects below the inferior margin of the image. IMPRESSION: CHF with pulmonary interstitial and alveolar edema. Bibasilar atelectasis. One cannot exclude bibasilar pneumonia in the appropriate clinical setting. Thoracic aortic atherosclerosis. Electronically Signed   By: David  Martinique M.D.   On: 03/04/2018 13:32    Medications:  Prior to Admission:  Medications Prior to Admission  Medication Sig Dispense Refill Last Dose  . carvedilol (COREG) 6.25 MG tablet TAKE 1 TABLET BY MOUTH TWICE DAILY. (Patient taking differently: Take 6.25 mg by mouth 2 (two) times daily with a meal. ) 60 tablet 0 03/02/2018 at 1700  . colchicine 0.6 MG tablet Take 0.6 mg by mouth 2 (two) times daily.   03/01/2018 at Unknown time  . diphenoxylate-atropine (LOMOTIL) 2.5-0.025 MG tablet Take 1 tablet by mouth 4 (four) times daily as needed for diarrhea or loose stools. 45 tablet 0 unknown  . fluticasone (FLONASE) 50 MCG/ACT nasal  spray Place 2 sprays into both nostrils daily. 16 g 5 03/01/2018 at Unknown time  . guaiFENesin (ROBITUSSIN) 100 MG/5ML liquid Take 100 mg by mouth 3 (three) times daily as needed for cough.   unknown  . insulin aspart (NOVOLOG) 100 UNIT/ML injection Inject 5 Units into the skin 2 (two) times daily. Inject 5 units subcutaneously twice daily before lunch and supper hold if BX <150 same as human insulin   03/01/2018 at Unknown time  . INVOKANA 100 MG TABS tablet TAKE ONE TABLET BY MOUTH ONCE DAILY BEFORE BREAKFAST. (Patient taking differently: Take 100 mg by mouth daily. ) 30 tablet 0 03/01/2018 at Unknown time  . lactobacillus acidophilus (BACID) TABS tablet Take 1 tablet by mouth 2  (two) times daily.    03/01/2018 at Unknown time  . LANTUS SOLOSTAR 100 UNIT/ML Solostar Pen INJECT 70 UNITS SUBCUTANEOUSLY AT 10pm. (Patient taking differently: Inject 70 Units into the skin at bedtime. ) 15 mL 3 03/01/2018 at Unknown time  . levocetirizine (XYZAL) 5 MG tablet Take 1 tablet (5 mg total) by mouth every evening. 90 tablet 1 03/01/2018 at Unknown time  . metFORMIN (GLUCOPHAGE-XR) 750 MG 24 hr tablet TAKE 2 TABLETS BY MOUTH EACH MORNING WITH BREAKFAST. (Patient taking differently: Take 750 mg by mouth daily with breakfast. ) 60 tablet 5 03/01/2018 at Unknown time  . methocarbamol (ROBAXIN) 500 MG tablet Take 1 tablet (500 mg total) by mouth every 6 (six) hours as needed for muscle spasms. 60 tablet 1 Past Month at Unknown time  . mupirocin ointment (BACTROBAN) 2 % PLACE A SMALL AMOUT TO TOP OF INCITION AT RIGHT HIP & DRY DRESSING DAILY. (Patient taking differently: Apply 1 application topically daily. Applied to right hip and dry dressing daily) 22 g 0 03/01/2018 at Unknown time  . nitroGLYCERIN (NITROSTAT) 0.4 MG SL tablet Place 1 tablet (0.4 mg total) under the tongue every 5 (five) minutes as needed for chest pain. Max 3 doses. 10 tablet 0 unknown  . ondansetron (ZOFRAN) 4 MG tablet Take 4 mg by mouth every 8 (eight) hours as needed for nausea or vomiting.   unknown  . oxyCODONE (OXY IR/ROXICODONE) 5 MG immediate release tablet Take 1-2 tablets (5-10 mg total) by mouth every 4 (four) hours as needed for moderate pain (pain score 4-6). 30 tablet 0 Past Week at Unknown time  . polyethylene glycol (MIRALAX / GLYCOLAX) packet Take 17 g by mouth daily as needed.   unknown  . potassium chloride SA (K-DUR,KLOR-CON) 20 MEQ tablet Take 20 mEq by mouth daily.   03/01/2018 at Unknown time  . Probiotic Product (RISA-BID PROBIOTIC) TABS Take 1 tablet by mouth 2 (two) times daily.   03/02/2018 at Unknown time  . sacubitril-valsartan (ENTRESTO) 24-26 MG Take 1 tablet by mouth 2 (two) times daily. 60 tablet 6  03/01/2018 at Unknown time  . simvastatin (ZOCOR) 20 MG tablet TAKE 1 TABLET BY MOUTH ONCE DAILY. (Patient taking differently: Take 20 mg by mouth every evening. ) 30 tablet 5 03/01/2018 at Unknown time  . tiZANidine (ZANAFLEX) 4 MG tablet Take 1 tablet (4 mg total) by mouth every 8 (eight) hours as needed for muscle spasms. 60 tablet 0 unknown  . torsemide (DEMADEX) 20 MG tablet Take 20 mg by mouth 2 (two) times daily.   03/02/2018 at 1700  . VIIBRYD 40 MG TABS TAKE 1 TABLET BY MOUTH ONCE DAILY. (Patient taking differently: Take 40 mg by mouth daily. ) 30 tablet 0 03/01/2018 at Unknown time  .  XARELTO 20 MG TABS tablet TAKE 1 TABLET BY MOUTH ONCE DAILY. (Patient taking differently: Take 20 mg by mouth every evening. ) 30 tablet 5 03/01/2018 at 1700  . Multiple Vitamins-Minerals (THERA-M) TABS TAKE 1 TABLET BY MOUTH ONCE DAILY. (Patient not taking: Reported on 03/02/2018) 90 tablet 1 Not Taking at Unknown time   Scheduled: . carvedilol  6.25 mg Oral BID WC  . colchicine  0.6 mg Oral BID  . insulin aspart  0-15 Units Subcutaneous Q4H  . insulin glargine  70 Units Subcutaneous QHS  . ipratropium-albuterol  3 mL Nebulization Q6H  . mouth rinse  15 mL Mouth Rinse BID  . methylPREDNISolone (SOLU-MEDROL) injection  40 mg Intravenous Q12H  . mupirocin ointment  1 application Topical Daily  . rivaroxaban  20 mg Oral QPM  . sodium chloride flush  3 mL Intravenous Q12H   Continuous: . sodium chloride    . famotidine (PEPCID) IV Stopped (03/05/18 2215)  . piperacillin-tazobactam (ZOSYN)  IV 3.375 g (03/06/18 0742)   XAJ:OINOMV chloride, acetaminophen, fentaNYL (SUBLIMAZE) injection, fentaNYL (SUBLIMAZE) injection, metoCLOPramide (REGLAN) injection, midazolam, midazolam, ondansetron **OR** ondansetron (ZOFRAN) IV, polyethylene glycol, sodium chloride flush, tiZANidine  Assesment: He was admitted with acute hypoxic and hypercapnic respiratory failure after aspirating.  He had an acute GI illness with nausea  and vomiting.  He was ventilated for about 30 hours and has tolerated extubation well.  He seems much better.  He does not have any respiratory issues at baseline.  His oxygen requirement is less. Principal Problem:   Acute respiratory failure with hypoxia and hypercapnia (HCC) Active Problems:   Type II diabetes mellitus with manifestations (HCC)   Chronic atrial fibrillation (HCC)   Acute respiratory failure ([place])    Plan: Agree with transfer to floor.  Continue treatments.  Could switch to Augmentin.  I will plan to sign off.  Thanks for allowing me to see him with you    LOS: 3 days   Ayonna Speranza L 03/06/2018, 8:56 AM

## 2018-03-06 NOTE — Care Management Important Message (Signed)
Important Message  Patient Details  Name: Cody Hale MRN: [id number] Date of Birth: [birth year]   Medicare Important Message Given:  Yes    Shyne, [place] 03/06/2018, 12:35 PM

## 2018-03-06 NOTE — Progress Notes (Signed)
Progress Note  Patient Name: Cody Hale Date of Encounter: 03/06/2018  Primary Cardiologist: Dr. Prentice Docker  Subjective   Breathing continues to improve.  No active wheezing, no chest pain or palpitations.  Still has some mild leg swelling, but this is largely chronic. Tolerating oral medications.  Inpatient Medications    Scheduled Meds: . carvedilol  6.25 mg Oral BID WC  . colchicine  0.6 mg Oral BID  . insulin aspart  0-15 Units Subcutaneous Q4H  . insulin glargine  70 Units Subcutaneous QHS  . ipratropium-albuterol  3 mL Nebulization Q6H  . mouth rinse  15 mL Mouth Rinse BID  . methylPREDNISolone (SOLU-MEDROL) injection  40 mg Intravenous Q12H  . mupirocin ointment  1 application Topical Daily  . rivaroxaban  20 mg Oral QPM  . sodium chloride flush  3 mL Intravenous Q12H   Continuous Infusions: . sodium chloride    . famotidine (PEPCID) IV Stopped (03/05/18 2215)  . piperacillin-tazobactam (ZOSYN)  IV 3.375 g (03/06/18 0742)   PRN Meds: sodium chloride, acetaminophen, fentaNYL (SUBLIMAZE) injection, fentaNYL (SUBLIMAZE) injection, metoCLOPramide (REGLAN) injection, midazolam, midazolam, ondansetron **OR** ondansetron (ZOFRAN) IV, polyethylene glycol, sodium chloride flush, tiZANidine   Vital Signs    Vitals:   03/06/18 0600 03/06/18 0740 03/06/18 0745 03/06/18 0831  BP: (!) 149/70     Pulse: 67 76 61   Resp: 11  16   Temp:    97.6 F (36.4 C)  TempSrc:    Oral  SpO2: 96%  97%   Weight:      Height:        Intake/Output Summary (Last 24 hours) at 03/06/2018 0901 Last data filed at 03/06/2018 0600 Gross per 24 hour  Intake 481.96 ml  Output 1300 ml  Net -818.04 ml   Filed Weights   03/03/18 0502 03/04/18 0430 03/05/18 0500  Weight: 124.5 kg 119 kg 119 kg    Telemetry    Atrial fibrillation.  Personally reviewed.  Physical Exam   GEN:  Obese male.  No acute distress.   Neck: No JVD. Cardiac:  Irregularly irregular, soft murmur, no  gallop.  Respiratory: Nonlabored. Clear to auscultation bilaterally. GI: Soft, nontender, bowel sounds present. MS: 1-2+ leg edema; No deformity. Neuro:  Nonfocal. Psych: Alert and oriented x 3. Normal affect.  Labs    Chemistry Recent Labs  Lab 03/02/18 1835  03/03/18 0431 03/04/18 0545 03/05/18 0406 03/06/18 0413  NA 141   < > 142 144 145 141  K 4.2   < > 5.2* 4.5 4.3 4.4  CL 105   < > 107 106 105 105  CO2 26  --  25 27 26  32  GLUCOSE 204*   < > 232* 221* 216* 155*  BUN 16   < > 22 39* 41* 30*  CREATININE 0.96   < > 1.31* 1.89* 1.25* 0.91  CALCIUM 9.1  --  9.1 9.0 9.2 9.0  PROT 7.5  --  7.3  --   --   --   ALBUMIN 4.0  --  3.7  --   --   --   AST 28  --  27  --   --   --   ALT 24  --  22  --   --   --   ALKPHOS 90  --  96  --   --   --   BILITOT 1.1  --  1.0  --   --   --  GFRNONAA >60  --  55* 35* 58* >60  GFRAA >60  --  >60 41* >60 >60  ANIONGAP 10  --  10 11 14  4*   < > = values in this interval not displayed.     Hematology Recent Labs  Lab 03/03/18 0431 03/04/18 0545 03/06/18 0413  WBC 15.4* 12.7* 8.0  RBC 5.07 4.30 3.87*  HGB 16.0 13.4 11.9*  HCT 50.7 42.0 38.0*  MCV 100.0 97.7 98.2  MCH 31.6 31.2 30.7  MCHC 31.6 31.9 31.3  RDW 14.9 15.1 14.5  PLT 184 146* 122*    Cardiac Enzymes Recent Labs  Lab 03/02/18 1835  TROPONINI <0.03    Recent Labs  Lab 03/02/18 1836  TROPIPOC 0.01     BNP Recent Labs  Lab 03/02/18 1835  BNP 435.0*     Radiology    Dg Chest Port 1v Same Day  Result Date: 03/04/2018 CLINICAL DATA:  Respiratory distress, atrial fibrillation, CHF, COPD, former smoker. EXAM: PORTABLE CHEST 1 VIEW COMPARISON:  Portable chest x-ray of Dec 02, 2017 FINDINGS: The lungs are adequately inflated. The interstitial markings are increased bilaterally. The retrocardiac region on the left and the right infrahilar regions remain dense. The cardiac silhouette is enlarged and the pulmonary vascularity engorged. The sternal wires are intact.  There is calcification in the wall of the aortic arch. A nasogastric tube is present whose tip projects below the inferior margin of the image. IMPRESSION: CHF with pulmonary interstitial and alveolar edema. Bibasilar atelectasis. One cannot exclude bibasilar pneumonia in the appropriate clinical setting. Thoracic aortic atherosclerosis. Electronically Signed   By: David  [place] M.D.   On: 03/04/2018 13:32    Cardiac Studies   Echocardiogram 11/24/2017: Study Conclusions  - Left ventricle: The cavity size was normal. Wall thickness was   increased in a pattern of moderate LVH. Images are limited even   with use of Definity contrast. Systolic function was moderately   to severely reduced. The estimated ejection fraction was in the   range of 30% to 35%. There is dyskinesis of the   mid-apicalanteroseptal myocardium. The study was not technically   sufficient to allow evaluation of LV diastolic dysfunction due to   atrial fibrillation. - Aortic valve: Mildly calcified annulus. Trileaflet; mildly   calcified leaflets. - Mitral valve: St. 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   There was no significant regurgitation or perivalvular   regurgitation. Valve area by pressure half-time: 1.58 cm^2. - Left atrium: The atrium was mildly dilated. - Right atrium: Central venous pressure (est): 3 mm Hg. - Atrial septum: No defect or patent foramen ovale was identified. - Tricuspid valve: There was trivial regurgitation. - Pulmonary arteries: Systolic pressure could not be accurately   estimated. - Pericardium, extracardiac: There was no pericardial effusion.   Patient Profile     67 y.o. 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 He is admitted with hypoxic/hypercarbic respiratory failure,  possible aspiration pneumonia, requiring mechanical ventilation.  Assessment & Plan    1.  Permanent atrial fibrillation.  Heart rate is adequately controlled, now back on outpatient dose of Coreg and Xarelto.  No changes made today.  2.  Acute on chronic combined heart failure, relatively stable.  Net output more than intake yesterday even off IV Lasix which was held.  Creatinine has improved.  3.  Nonischemic  cardiomyopathy with LVEF 30 to 35%.  4.  Bioprosthetic mitral valve replacement in 2014, valve function normal by echocardiogram in May of this year.  5.  Acute hypoxic/hypercarbic respiratory failure with suspected aspiration pneumonia.  Patient improving in terms of oxygen requirement.  He has been managed by Dr. Juanetta Gosling.  6.  Acute renal insufficiency, creatinine has come down to 0.91.  Continue Coreg and Xarelto.  Plan to resume Entresto 24/26 mg twice daily and Zocor 20 mg daily.  Recheck BMET in a.m.  If renal function stable can go back on previous outpatient dose of Demadex with potassium supplements.  Agree with transfer to telemetry.  Signed, Nona Dell, MD  03/06/2018, 9:01 AM

## 2018-03-06 NOTE — Plan of Care (Signed)
  Problem: Acute Rehab PT Goals(only PT should resolve) Goal: Pt will Roll Supine to Side Outcome: Progressing Flowsheets (Taken 03/06/2018 1047) Pt will Roll Supine to Side: with supervision Goal: Pt Will Go Supine/Side To Sit Outcome: Progressing Flowsheets (Taken 03/06/2018 1047) Pt will go Supine/Side to Sit: with supervision Goal: Pt Will Go Sit To Supine/Side Outcome: Progressing Flowsheets (Taken 03/06/2018 1047) Pt will go Sit to Supine/Side: with supervision Goal: Patient Will Transfer Sit To/From Stand Outcome: Progressing Flowsheets (Taken 03/06/2018 1047) Patient will transfer sit to/from stand: with min guard assist Goal: Pt Will Transfer Bed To Chair/Chair To Bed Outcome: Progressing Flowsheets (Taken 03/06/2018 1047) Pt will Transfer Bed to Chair/Chair to Bed: min guard assist Note:  W/ LRAD  Goal: Pt Will Ambulate Outcome: Progressing Flowsheets (Taken 03/06/2018 1047) Pt will Ambulate: 75 feet; with least restrictive assistive device; with min guard assist Goal: Pt/caregiver will Perform Home Exercise Program Outcome: Progressing Flowsheets (Taken 03/06/2018 1047) Pt/caregiver will Perform Home Exercise Program: For increased strengthening; For improved balance; With minimal assist  Katina Dung. Hartnett-Rands, MS, PT [facility] [phone]  03/06/2018

## 2018-03-06 NOTE — [place] FL2 (Signed)
[place] MEDICAID FL2 LEVEL OF CARE SCREENING TOOL     IDENTIFICATION  Patient Name: Cody Hale Birthdate: 03-25-1951 Sex: male Admission Date (Current Location): 03/02/2018  [facility] and [place] Number:  [employer] and Address:  [facility],  [address], [place] [address]      Provider Number: [id number]  Attending Physician Name and Address:  Erick Blinks, DO  Relative Name and Phone Number:       Current Level of Care: Hospital Recommended Level of Care: Skilled Nursing Facility Prior Approval Number: [id number]  Date Approved/Denied: 01/17/18 PASRR Number:    Discharge Plan: SNF    Current Diagnoses: Patient Active Problem List   Diagnosis Date Noted  . Acute respiratory failure (HCC) 03/03/2018  . Acute respiratory failure with hypoxia and hypercapnia (HCC) 03/03/2018  . Status post total replacement of right hip 01/16/2018  . Antibiotic-associated diarrhea 01/01/2018  . Therapeutic opioid-induced constipation (OIC) 12/04/2017  . Venous stasis dermatitis of both lower extremities 07/31/2017  . Unilateral primary osteoarthritis, right hip 04/22/2017  . 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 Unilateral primary osteoarthritis, right knee 09/18/2016  . Seasonal allergic rhinitis due to pollen 03/13/2016  . Chronic systolic heart failure (HCC) 01/11/2015  . Chronic atrial fibrillation (HCC) 01/11/2015  . S/P mitral valve replacement with bioprosthetic valve 01/11/2015  . Depression with somatization 11/22/2014  . Primary osteoarthritis of both knees 11/22/2014  . COPD (chronic obstructive pulmonary disease) with chronic bronchitis (HCC) 05/12/2014  . B12 deficiency anemia 02/03/2014  . Routine general medical examination at a health care facility 03/21/2013  . Hyperlipidemia with target LDL less than 70 03/19/2013  . Essential hypertension, malignant 12/01/2012  . Type II diabetes mellitus with manifestations (HCC)    . Gastroparesis 11/30/2012  . OSA (obstructive sleep apnea) 11/16/2012  . Endocarditis-resolved 08/26/2012  . LBBB (left bundle branch block) 07/03/2012  . Morbid obesity (HCC) 06/27/2012    Orientation RESPIRATION BLADDER Height & Weight     Self, Time, Situation, Place(at baseline (pt currently on vent))  O2(see dc summary) Continent Weight: 262 lb 5.6 oz (119 kg) Height:  5\' 9"  (175.3 cm)  BEHAVIORAL SYMPTOMS/MOOD NEUROLOGICAL BOWEL NUTRITION STATUS      Continent Diet(see dc summary)  AMBULATORY STATUS COMMUNICATION OF NEEDS Skin   Extensive Assist Verbally Normal                       Personal Care Assistance Level of Assistance  Bathing, Feeding, Dressing Bathing Assistance: Limited assistance Feeding assistance: Independent Dressing Assistance: Limited assistance     Functional Limitations Info  Sight, Hearing, Speech Sight Info: Adequate Hearing Info: Adequate Speech Info: Adequate    SPECIAL CARE FACTORS FREQUENCY  PT (By licensed PT)     PT Frequency: 5 times week              Contractures Contractures Info: Not present    Additional Factors Info  Code Status, Allergies Code Status Info: full Allergies Info: Daptomycin, Lisinopril, Tape           Current Medications (03/06/2018):  This is the current hospital active medication list Current Facility-Administered Medications  Medication Dose Route Frequency Provider Last Rate Last Dose  . 0.9 %  sodium chloride infusion  250 mL Intravenous PRN Meredeth Ide, MD      . acetaminophen (TYLENOL) suppository 650 mg  650 mg Rectal Q4H PRN Schorr, Roma Kayser, NP  650 mg at 03/03/18 2014  . carvedilol (COREG) tablet 6.25 mg  6.25 mg Oral BID WC Jonelle Sidle, MD   6.25 mg at 03/06/18 0740  . colchicine tablet 0.6 mg  0.6 mg Oral BID Meredeth Ide, MD   0.6 mg at 03/06/18 1002  . famotidine (PEPCID) IVPB 20 mg premix  20 mg Intravenous Q12H Kari Baars, MD   Stopped at 03/06/18 1035  .  fentaNYL (SUBLIMAZE) injection 50 mcg  50 mcg Intravenous Q15 min PRN Philip Aspen, Limmie Patricia, MD   50 mcg at 03/03/18 1307  . fentaNYL (SUBLIMAZE) injection 50 mcg  50 mcg Intravenous Q2H PRN Philip Aspen, Limmie Patricia, MD   50 mcg at 03/04/18 1320  . insulin aspart (novoLOG) injection 0-15 Units  0-15 Units Subcutaneous Q4H Maurilio Lovely D, DO   2 Units at 03/06/18 0449  . insulin glargine (LANTUS) injection 70 Units  70 Units Subcutaneous QHS Meredeth Ide, MD   70 Units at 03/05/18 2145  . ipratropium-albuterol (DUONEB) 0.5-2.5 (3) MG/3ML nebulizer solution 3 mL  3 mL Nebulization Q6H Meredeth Ide, MD   3 mL at 03/06/18 0747  . MEDLINE mouth rinse  15 mL Mouth Rinse BID Sherryll Burger, Pratik D, DO   15 mL at 03/06/18 1004  . methylPREDNISolone sodium succinate (SOLU-MEDROL) 40 mg/mL injection 40 mg  40 mg Intravenous Thomasene Mohair, MD   40 mg at 03/06/18 0744  . metoCLOPramide (REGLAN) injection 10 mg  10 mg Intravenous Q6H PRN Meredeth Ide, MD   10 mg at 03/04/18 1756  . midazolam (VERSED) injection 1 mg  1 mg Intravenous Q15 min PRN Philip Aspen, Limmie Patricia, MD   1 mg at 03/03/18 1325  . midazolam (VERSED) injection 1 mg  1 mg Intravenous Q2H PRN Philip Aspen, Limmie Patricia, MD   1 mg at 03/04/18 1232  . mupirocin ointment (BACTROBAN) 2 % 1 application  1 application Topical Daily Meredeth Ide, MD   1 application at 03/06/18 1004  . ondansetron (ZOFRAN) tablet 4 mg  4 mg Oral Q6H PRN Meredeth Ide, MD       Or  . ondansetron (ZOFRAN) injection 4 mg  4 mg Intravenous Q6H PRN Meredeth Ide, MD   4 mg at 03/06/18 1002  . piperacillin-tazobactam (ZOSYN) IVPB 3.375 g  3.375 g Intravenous Q8H Philip Aspen, Limmie Patricia, MD 12.5 mL/hr at 03/06/18 0742 3.375 g at 03/06/18 0742  . polyethylene glycol (MIRALAX / GLYCOLAX) packet 17 g  17 g Oral Daily PRN Meredeth Ide, MD      . rivaroxaban (XARELTO) tablet 20 mg  20 mg Oral QPM Shah, Pratik D, DO   20 mg at 03/05/18 1719  . sacubitril-valsartan  (ENTRESTO) 24-26 mg per tablet  1 tablet Oral BID Jonelle Sidle, MD   1 tablet at 03/06/18 1002  . simvastatin (ZOCOR) tablet 20 mg  20 mg Oral q1800 Jonelle Sidle, MD      . sodium chloride flush (NS) 0.9 % injection 3 mL  3 mL Intravenous Q12H Sharl Ma, Sarina Ill, MD   3 mL at 03/06/18 1004  . sodium chloride flush (NS) 0.9 % injection 3 mL  3 mL Intravenous PRN Sharl Ma, Sarina Ill, MD      . tiZANidine (ZANAFLEX) tablet 4 mg  4 mg Oral Q8H PRN Meredeth Ide, MD         Discharge Medications: Please see discharge summary for a list  of discharge medications.  Relevant Imaging Results:  Relevant Lab Results:   Additional Information SSN: [id number] [place]

## 2018-03-06 NOTE — Clinical Social Work Note (Signed)
LCSW following. PT recommending SNF rehab for pt. Met with pt today to discuss. Pt agreeable to SNF. Referral sent to [facility] at pt request and he has a bed offer. If pt is ready over the weekend, he can transfer. Weekend CSW available to assist if needed.   Updated Butch Penny at [facility].

## 2018-03-06 NOTE — Progress Notes (Signed)
**Note De-Identified  Obfuscation** Anticipating patient transition to unit 300 03/06/18. CPT with bed will be discontinued.  Flutter education this am.  Patient tolerated well.  RT to continue to monitor.

## 2018-03-06 NOTE — Progress Notes (Signed)
PROGRESS NOTE    Cody Hale  YNW:[id number] DOB: [birth year] DOA: 03/02/2018 PCP: [name], MD   Brief Narrative:   68 year old man admitted from home on 8/26 due to nausea and vomiting. He has a history of chronic atrial fibrillation on chronic anticoagulation with Xarelto, chronic systolic heart failure with ejection fraction of 30 to 35%, he has a bioprosthetic mitral valve, COPD and hypertension. He developed nausea and dry heaving after eating lunch on the day of admission. In the emergency department he was found to have significant shortness of breath and blood glass showed respiratory acidosis with a PCO2 in the 80s. He was started on BiPAP. He subsequently vomited and initial plans were made for intubation, however because he stabilized, was alert and oriented and was saturating in the 90s intubation plans were deferred. A few hours later he further decompensated with repeat emesis into the BiPAP mask and this time did not recover. Anesthesia was called for intubation this was performed at around 7:52 AM on 8/27. Repeat chest x-ray showssome improvement.He had mild pulmonary edema for which she has received Lasix with good diuresis noted. He is now developing some AKI and Lasix has been discontinued.  He had been extubated on 8/28 after he was noted to have a thick mucous plug.   Assessment & Plan:  Principal Problem: Acute respiratory failure with hypoxia and hypercapnia (HCC) Active Problems: Type II diabetes mellitus with manifestations (HCC) Chronic atrial fibrillation (HCC) Acute respiratory failure (HCC)   1. Acute hypoxemic and hypercarbic respiratory failure likely secondary to aspiration pneumonia, acute COPD exacerbation, and acute on chronic systolic heart failure-improving.   Patient still remains on high flow 5 L nasal cannula and was extubated on 8/28 on account of a mucous plug.  Appreciate pulmonology and cardiology  recommendations.Continue Solu-Medrol and breathing treatments. DC Zosyn and switch to Augmentin today. 2. AKI. Secondary to aggressive diuresis.   Continue to hold Lasix for now with repeat BMET in am, after which time Demadex and potassium could be resumed. 3. Atrial fibrillation with RVR-resolved.  Per cardiology, continue Coreg and Xarelto along with resumption of Entresto and statin. 4. Acute on chronic systolic heart failure. Noted to have LVEF of 30 to 35% with dyskinesis on 2D echocardiogram from 11/2017. Holding Lasix for now.  Repeat labs pending for a.m. 5. Insulin-dependent diabetes with hyperglycemia-improving. Will continue sliding scale today as he appears to be having some steroid-induced hyperglycemia. Continue to monitor. 6. Status post right hip arthroplasty. This was approximately 45 days ago and is currently stable.  PT evaluation with recommendations for SNF.   DVT prophylaxis: Xarelto Code Status:Full Family Communication:No family at bedside Disposition Plan: Continue to monitor in ICU to ensure that heart rate is stable and the patient can be weaned off nasal cannula 5 L.  Plan to transition to general medical floor today with possible SNF in AM.   Consultants:  Pulmonology  Cardiology  Procedures:  Intubation on 8/27 with extubation on 8/28  Antimicrobials:  Zosyn 8/27->8/30  Augmentin 8/30->  Subjective: Patient seen and evaluated today with no new acute complaints or concerns. No acute concerns or events noted overnight.  He appears to be doing well and Foley catheter has been removed.  Objective: Vitals:   03/06/18 0740 03/06/18 0745 03/06/18 0831 03/06/18 1217  BP:      Pulse: 76 61    Resp:  16    Temp:   97.6 F (36.4 C) (!) 97.5 F (36.4 C)  TempSrc:   Oral Oral  SpO2:  97%    Weight:      Height:        Intake/Output Summary (Last 24 hours) at 03/06/2018 1425 Last data filed at 03/06/2018 0941 Gross per 24 hour    Intake 378.96 ml  Output 1300 ml  Net -921.04 ml   Filed Weights   03/03/18 0502 03/04/18 0430 03/05/18 0500  Weight: 124.5 kg 119 kg 119 kg    Examination:  General exam: Appears calm and comfortable  Respiratory system: Clear to auscultation. Respiratory effort normal. On 5L [place]. Cardiovascular system: S1 & S2 heard, RRR. No JVD, murmurs, rubs, gallops or clicks. No pedal edema. Gastrointestinal system: Abdomen is nondistended, soft and nontender. No organomegaly or masses felt. Normal bowel sounds heard. Central nervous system: Alert and oriented. No focal neurological deficits. Extremities: Symmetric 5 x 5 power. Skin: No rashes, lesions or ulcers Psychiatry: Judgement and insight appear normal. Mood & affect appropriate.     Data Reviewed: I have personally reviewed following labs and imaging studies  CBC: Recent Labs  Lab 03/02/18 1835 03/02/18 1837 03/03/18 0431 03/04/18 0545 03/06/18 0413  WBC 7.0  --  15.4* 12.7* 8.0  NEUTROABS 6.2  --   --   --   --   HGB 14.9 16.0 16.0 13.4 11.9*  HCT 46.2 47.0 50.7 42.0 38.0*  MCV 97.5  --  100.0 97.7 98.2  PLT 160  --  184 146* 122*   Basic Metabolic Panel: Recent Labs  Lab 03/02/18 1835 03/02/18 1837 03/03/18 0431 03/04/18 0545 03/05/18 0406 03/06/18 0413  NA 141 142 142 144 145 141  K 4.2 4.2 5.2* 4.5 4.3 4.4  CL 105 103 107 106 105 105  CO2 26  --  25 27 26  32  GLUCOSE 204* 201* 232* 221* 216* 155*  BUN 16 17 22  39* 41* 30*  CREATININE 0.96 0.90 1.31* 1.89* 1.25* 0.91  CALCIUM 9.1  --  9.1 9.0 9.2 9.0  MG 2.1  --   --   --  2.3  --    GFR: Estimated Creatinine Clearance: 100.3 mL/min (by C-G formula based on SCr of 0.91 mg/dL). Liver Function Tests: Recent Labs  Lab 03/02/18 1835 03/03/18 0431  AST 28 27  ALT 24 22  ALKPHOS 90 96  BILITOT 1.1 1.0  PROT 7.5 7.3  ALBUMIN 4.0 3.7   Recent Labs  Lab 03/02/18 1835  LIPASE 23   No results for input(s): AMMONIA in the last 168 hours. Coagulation  Profile: No results for input(s): INR, PROTIME in the last 168 hours. Cardiac Enzymes: Recent Labs  Lab 03/02/18 1835  TROPONINI <0.03   BNP (last 3 results) No results for input(s): PROBNP in the last 8760 hours. HbA1C: No results for input(s): HGBA1C in the last 72 hours. CBG: Recent Labs  Lab 03/05/18 1956 03/06/18 0005 03/06/18 0407 03/06/18 0811 03/06/18 1151  GLUCAP 190* 191* 138* 120* 177*   Lipid Profile: No results for input(s): CHOL, HDL, LDLCALC, TRIG, CHOLHDL, LDLDIRECT in the last 72 hours. Thyroid Function Tests: No results for input(s): TSH, T4TOTAL, FREET4, T3FREE, THYROIDAB in the last 72 hours. Anemia Panel: No results for input(s): VITAMINB12, FOLATE, FERRITIN, TIBC, IRON, RETICCTPCT in the last 72 hours. Sepsis Labs: Recent Labs  Lab 03/02/18 1856 03/02/18 2120  LATICACIDVEN 0.9 1.6    Recent Results (from the past 240 hour(s))  MRSA PCR Screening     Status: None   Collection Time: 03/03/18  4:02 AM  Result Value Ref Range Status   MRSA by PCR NEGATIVE NEGATIVE Final    Comment:        The GeneXpert MRSA Assay (FDA approved for NASAL specimens only), is one component of a comprehensive MRSA colonization surveillance program. It is not intended to diagnose MRSA infection nor to guide or monitor treatment for MRSA infections. Performed at [facility], [address] [place], [place] [address]   Culture, blood (Routine X 2) w Reflex to ID Panel     Status: None (Preliminary result)   Collection Time: 03/03/18 10:43 AM  Result Value Ref Range Status   Specimen Description BLOOD LEFT WRIST  Final   Special Requests   Final    BOTTLES DRAWN AEROBIC AND ANAEROBIC Blood Culture adequate volume   Culture   Final    NO GROWTH 3 DAYS Performed at [facility], [address], [place], [place] [address]    Report Status PENDING  Incomplete  Culture, blood (Routine X 2) w Reflex to ID Panel     Status: None (Preliminary result)   Collection  Time: 03/03/18 10:51 AM  Result Value Ref Range Status   Specimen Description BLOOD LEFT HAND  Final   Special Requests   Final    BOTTLES DRAWN AEROBIC AND ANAEROBIC Blood Culture adequate volume   Culture   Final    NO GROWTH 3 DAYS Performed at [facility], [address], [place], [place] [address]    Report Status PENDING  Incomplete  Culture, respiratory     Status: None   Collection Time: 03/03/18 11:48 AM  Result Value Ref Range Status   Specimen Description   Final    TRACHEAL ASPIRATE Performed at [facility], [address], [place], [place] [address]    Special Requests   Final    NONE Performed at [facility], [address] [place], [place] [address]    Gram Stain   Final    ABUNDANT WBC PRESENT, PREDOMINANTLY PMN RARE GRAM POSITIVE COCCI    Culture   Final    RARE Consistent with normal respiratory flora. Performed at [facility], [address], [place], [place] [address]    Report Status 03/05/2018 FINAL  Final         Radiology Studies: No results found.      Scheduled Meds: . amoxicillin-clavulanate  1 tablet Oral Q12H  . carvedilol  6.25 mg Oral BID WC  . colchicine  0.6 mg Oral BID  . insulin aspart  0-15 Units Subcutaneous Q4H  . insulin glargine  70 Units Subcutaneous QHS  . ipratropium-albuterol  3 mL Nebulization Q6H  . mouth rinse  15 mL Mouth Rinse BID  . methylPREDNISolone (SOLU-MEDROL) injection  40 mg Intravenous Q12H  . mupirocin ointment  1 application Topical Daily  . rivaroxaban  20 mg Oral QPM  . sacubitril-valsartan  1 tablet Oral BID  . simvastatin  20 mg Oral q1800  . sodium chloride flush  3 mL Intravenous Q12H   Continuous Infusions: . sodium chloride    . famotidine (PEPCID) IV Stopped (03/06/18 1035)     LOS: 3 days    Time spent: 30 minutes    Cody Costanzo Hoover Brunette, DO [facility] Pager [phone]  If 7PM-7AM, please contact night-coverage [email or web address] Password [email or web address] 03/06/2018, 2:25 PM

## 2018-03-06 NOTE — Evaluation (Signed)
Physical Therapy Evaluation Patient Details Name: Cody Hale MRN: [id number] DOB: 07/12/1950 Today's Date: 03/06/2018   History of Present Illness  Pt is a 67 yr old male admitted 03/02/2018 with acute respiratory failure with hypoxia and hypercapnia; nausea and vomiting; post extubation; PMH: DMII, a fib, mitral valve replacement, COPD.   Clinical Impression  Patient was sleepy during today's evaluation and struggling with nausea. Patient overall performed well but, due to weakness, decreased mobility and safety concerns, does need assistance for bed mobility, transfers and ambulation with RW for at this time.  Patient would continue to benefit from skilled physical therapy in current environment and next venue to continue return to prior function and increase strength, endurance, balance, coordination, and functional mobility and gait skills.      Follow Up Recommendations SNF;Supervision for mobility/OOB    Equipment Recommendations  None recommended by PT    Recommendations for Other Services       Precautions / Restrictions Precautions Precautions: Fall Restrictions Weight Bearing Restrictions: No      Mobility  Bed Mobility Overal bed mobility: Needs Assistance Bed Mobility: Sidelying to Sit   Sidelying to sit: Supervision;Min assist       General bed mobility comments: min assist for line management  Transfers Overall transfer level: Needs assistance Equipment used: Rolling walker (2 wheeled) Transfers: Sit to/from [employer] Sit to Stand: Min guard;Min assist Stand pivot transfers: Min guard;Min assist       General transfer comment: min assist for line management  Ambulation/Gait Ambulation/Gait assistance: Min guard;Min assist Gait Distance (Feet): 6 Feet Assistive device: Rolling walker (2 wheeled) Gait Pattern/deviations: Trunk flexed;Shuffle;Decreased stride length Gait velocity: decreased   General Gait Details: min assist  for line management  Stairs            Wheelchair Mobility    Modified Rankin (Stroke Patients Only)       Balance Overall balance assessment: Needs assistance Sitting-balance support: Bilateral upper extremity supported;Feet supported Sitting balance-Leahy Scale: Fair     Standing balance support: Bilateral upper extremity supported;During functional activity Standing balance-Leahy Scale: Fair                               Pertinent Vitals/Pain Pain Assessment: No/denies pain    Home Living Family/patient expects to be discharged to:: Private residence Living Arrangements: Alone   Type of Home: Assisted living Home Access: Level entry     Home Layout: One level Home Equipment: Cane - single point;Walker - 2 wheels;Shower seat      Prior Function Level of Independence: Independent with assistive device(s);Needs assistance         Comments: no longer drives; takes a facility bus to church.     Hand Dominance        Extremity/Trunk Assessment   Upper Extremity Assessment Upper Extremity Assessment: Generalized weakness    Lower Extremity Assessment Lower Extremity Assessment: Generalized weakness    Cervical / Trunk Assessment Cervical / Trunk Assessment: Kyphotic  Communication   Communication: No difficulties  Cognition Arousal/Alertness: Lethargic Behavior During Therapy: WFL for tasks assessed/performed Overall Cognitive Status: Within Functional Limits for tasks assessed                                 General Comments: patient was quite sleepy during evaluation but would respond to questions and engage in activity  safely.      General Comments      Exercises     Assessment/Plan    PT Assessment Patient needs continued PT services  PT Problem List Decreased strength;Decreased mobility;Decreased activity tolerance;Decreased balance       PT Treatment Interventions DME instruction;Therapeutic  activities;Gait training;Therapeutic exercise;Patient/family education;Balance training    PT Goals (Current goals can be found in the Care Plan section)  Acute Rehab PT Goals Patient Stated Goal: continue with HHPT for R THA after discharge from hospital PT Goal Formulation: With patient Time For Goal Achievement: 03/20/18    Frequency Min 2X/week   Barriers to discharge        Co-evaluation               AM-PAC PT "6 Clicks" Daily Activity  Outcome Measure Difficulty turning over in bed (including adjusting bedclothes, sheets and blankets)?: A Little Difficulty moving from lying on back to sitting on the side of the bed? : A Little Difficulty sitting down on and standing up from a chair with arms (e.g., wheelchair, bedside commode, etc,.)?: A Little Help needed moving to and from a bed to chair (including a wheelchair)?: A Little Help needed walking in hospital room?: A Little Help needed climbing 3-5 steps with a railing? : A Lot 6 Click Score: 17    End of Session Equipment Utilized During Treatment: Oxygen Activity Tolerance: Patient tolerated treatment well;Patient limited by lethargy Patient left: in chair;with call bell/phone within reach Nurse Communication: Mobility status PT Visit Diagnosis: Unsteadiness on feet (R26.81);Other abnormalities of gait and mobility (R26.89);Difficulty in walking, not elsewhere classified (R26.2)    Time: 1610-9604 PT Time Calculation (min) (ACUTE ONLY): 28 min   Charges:   PT Evaluation $PT Eval Moderate Complexity: 1 Mod PT Treatments $Therapeutic Activity: 8-22 mins        Katina Dung. Hartnett-Rands, MS, PT [facility] PT [facility] [phone] 03/06/2018, 10:44 AM

## 2018-03-07 LAB — GLUCOSE, CAPILLARY
Glucose-Capillary: 106 mg/dL — ABNORMAL HIGH (ref 70–99)
Glucose-Capillary: 108 mg/dL — ABNORMAL HIGH (ref 70–99)
Glucose-Capillary: 132 mg/dL — ABNORMAL HIGH (ref 70–99)
Glucose-Capillary: 134 mg/dL — ABNORMAL HIGH (ref 70–99)
Glucose-Capillary: 144 mg/dL — ABNORMAL HIGH (ref 70–99)
Glucose-Capillary: 151 mg/dL — ABNORMAL HIGH (ref 70–99)
Glucose-Capillary: 155 mg/dL — ABNORMAL HIGH (ref 70–99)

## 2018-03-07 MED ORDER — POTASSIUM CHLORIDE CRYS ER 20 MEQ PO TBCR
20.0000 meq | EXTENDED_RELEASE_TABLET | Freq: Every day | ORAL | Status: DC
  Administered 2018-03-07 – 2018-03-08 (×2): 20 meq via ORAL
  Filled 2018-03-07 (×2): qty 1

## 2018-03-07 MED ORDER — TORSEMIDE 20 MG PO TABS
20.0000 mg | ORAL_TABLET | Freq: Two times a day (BID) | ORAL | Status: DC
  Administered 2018-03-07 – 2018-03-08 (×3): 20 mg via ORAL
  Filled 2018-03-07 (×4): qty 1

## 2018-03-07 MED ORDER — IPRATROPIUM-ALBUTEROL 0.5-2.5 (3) MG/3ML IN SOLN
3.0000 mL | Freq: Three times a day (TID) | RESPIRATORY_TRACT | Status: DC
  Administered 2018-03-07 – 2018-03-08 (×5): 3 mL via RESPIRATORY_TRACT
  Filled 2018-03-07 (×5): qty 3

## 2018-03-07 NOTE — Progress Notes (Signed)
Patient had 6 beats of VT per MT on tele. Patient asleep. Robb Matar, MD notified via text page. Will continue to monitor.

## 2018-03-07 NOTE — Progress Notes (Signed)
PROGRESS NOTE    Cody Hale  LAG:[id number] DOB: [birth year] DOA: 03/02/2018 PCP: [name], MD   Brief Narrative:   67 year old man admitted from home on 8/26 due to nausea and vomiting. He has a history of chronic atrial fibrillation on chronic anticoagulation with Xarelto, chronic systolic heart failure with ejection fraction of 30 to 35%, he has a bioprosthetic mitral valve, COPD and hypertension. He developed nausea and dry heaving after eating lunch on the day of admission. In the emergency department he was found to have significant shortness of breath and blood glass showed respiratory acidosis with a PCO2 in the 80s. He was started on BiPAP. He subsequently vomited and initial plans were made for intubation, however because he stabilized, was alert and oriented and was saturating in the 90s intubation plans were deferred. A few hours later he further decompensated with repeat emesis into the BiPAP mask and this time did not recover. Anesthesia was called for intubation this was performed at around 7:52 AM on 8/27. Repeat chest x-ray showssome improvement.He had mild pulmonary edema for which she has received Lasix with good diuresis noted. He is now developing some AKI and Lasix has been discontinued.He had been extubated on 8/28after he was noted to have a thick mucous plug.   Assessment & Plan:  Principal Problem: Acute respiratory failure with hypoxia and hypercapnia (HCC) Active Problems: Type II diabetes mellitus with manifestations (HCC) Chronic atrial fibrillation (HCC) Acute respiratory failure (HCC)   1. Acute hypoxemic and hypercarbic respiratory failure likely secondary to aspiration pneumonia, acute COPD exacerbation, and acute on chronic systolic heart failure-improving.Patient still remains on high flow 4 L nasal cannula and was extubated on 8/28 on account of a mucous plug. Appreciate pulmonology and cardiology  recommendations.Continue Solu-Medrol and breathing treatments. Continue Augmentin today (day 5/7).  Continue to wean oxygen. 2. AKI. Secondary to aggressive diuresis.This appears to have resolved, but a.m. labs are still pending.  Will resume Demadex and potassium at this time and continue to follow repeat lab work. 3. Atrial fibrillation with RVR-resolved. Per cardiology, continue Coreg and Xarelto along with resumption of Entresto and statin. 4. Acute on chronic systolic heart failure. Noted to have LVEF of 30 to 35% with dyskinesis on 2D echocardiogram from 11/2017.  Repeat labs pending for a.m. restart Demadex today. 5. Insulin-dependent diabetes with hyperglycemia-improving. Willcontinuesliding scale today as he appears to be having some steroid-induced hyperglycemia. Continue to monitor. 6. Status post right hip arthroplasty. This was approximately 45 days ago and is currently stable.  PT evaluation with recommendations for SNF with placement to [facility] in the next 1 to 2 days once stabilized.   DVT prophylaxis:Xarelto Code Status:Full Family Communication:No family at bedside Disposition Plan:Continue monitoring through today and wean oxygen as tolerated.  Restart Demadex and potassium supplementation from home.  Anticipate discharge to SNF in 1 to 2 days.   Consultants:  Pulmonology  Cardiology  Procedures:  Intubation on 8/27with extubation on 8/28  Antimicrobials:  Zosyn 8/27->8/30  Augmentin 8/30->  Subjective: Patient seen and evaluated today with no new acute complaints or concerns. No acute concerns or events noted overnight.  He states he mildly feels nauseous this morning and is hesitant to eat his breakfast.  He did have a 6 beat run of V. tach last night.  He continues to remain on 4 L nasal cannula.  Objective: Vitals:   03/07/18 0630 03/07/18 0811 03/07/18 1110 03/07/18 1114  BP: 121/73     Pulse: 67  Resp: 16     Temp:  (!) 97.5 F (36.4 C)     TempSrc: Oral     SpO2: 98% 96% 97% 96%  Weight: 121.8 kg     Height:        Intake/Output Summary (Last 24 hours) at 03/07/2018 1152 Last data filed at 03/07/2018 1128 Gross per 24 hour  Intake 414 ml  Output 1975 ml  Net -1561 ml   Filed Weights   03/04/18 0430 03/05/18 0500 03/07/18 0630  Weight: 119 kg 119 kg 121.8 kg    Examination:  General exam: Appears calm and comfortable  Respiratory system: Clear to auscultation. Respiratory effort normal.  On 4 L nasal cannula. Cardiovascular system: S1 & S2 heard, RRR. No JVD, murmurs, rubs, gallops or clicks. No pedal edema. Gastrointestinal system: Abdomen is nondistended, soft and nontender. No organomegaly or masses felt. Normal bowel sounds heard. Central nervous system: Alert and oriented. No focal neurological deficits. Extremities: Symmetric 5 x 5 power. Skin: No rashes, lesions or ulcers Psychiatry: Judgement and insight appear normal. Mood & affect appropriate.     Data Reviewed: I have personally reviewed following labs and imaging studies  CBC: Recent Labs  Lab 03/02/18 1835 03/02/18 1837 03/03/18 0431 03/04/18 0545 03/06/18 0413  WBC 7.0  --  15.4* 12.7* 8.0  NEUTROABS 6.2  --   --   --   --   HGB 14.9 16.0 16.0 13.4 11.9*  HCT 46.2 47.0 50.7 42.0 38.0*  MCV 97.5  --  100.0 97.7 98.2  PLT 160  --  184 146* 122*   Basic Metabolic Panel: Recent Labs  Lab 03/02/18 1835 03/02/18 1837 03/03/18 0431 03/04/18 0545 03/05/18 0406 03/06/18 0413  NA 141 142 142 144 145 141  K 4.2 4.2 5.2* 4.5 4.3 4.4  CL 105 103 107 106 105 105  CO2 26  --  25 27 26  32  GLUCOSE 204* 201* 232* 221* 216* 155*  BUN 16 17 22  39* 41* 30*  CREATININE 0.96 0.90 1.31* 1.89* 1.25* 0.91  CALCIUM 9.1  --  9.1 9.0 9.2 9.0  MG 2.1  --   --   --  2.3  --    GFR: Estimated Creatinine Clearance: 101.5 mL/min (by C-G formula based on SCr of 0.91 mg/dL). Liver Function Tests: Recent Labs  Lab 03/02/18 1835  03/03/18 0431  AST 28 27  ALT 24 22  ALKPHOS 90 96  BILITOT 1.1 1.0  PROT 7.5 7.3  ALBUMIN 4.0 3.7   Recent Labs  Lab 03/02/18 1835  LIPASE 23   No results for input(s): AMMONIA in the last 168 hours. Coagulation Profile: No results for input(s): INR, PROTIME in the last 168 hours. Cardiac Enzymes: Recent Labs  Lab 03/02/18 1835  TROPONINI <0.03   BNP (last 3 results) No results for input(s): PROBNP in the last 8760 hours. HbA1C: No results for input(s): HGBA1C in the last 72 hours. CBG: Recent Labs  Lab 03/07/18 0018 03/07/18 0410 03/07/18 0726 03/07/18 0809 03/07/18 1114  GLUCAP 151* 144* 108* 134* 106*   Lipid Profile: No results for input(s): CHOL, HDL, LDLCALC, TRIG, CHOLHDL, LDLDIRECT in the last 72 hours. Thyroid Function Tests: No results for input(s): TSH, T4TOTAL, FREET4, T3FREE, THYROIDAB in the last 72 hours. Anemia Panel: No results for input(s): VITAMINB12, FOLATE, FERRITIN, TIBC, IRON, RETICCTPCT in the last 72 hours. Sepsis Labs: Recent Labs  Lab 03/02/18 1856 03/02/18 2120  LATICACIDVEN 0.9 1.6    Recent Results (from  the past 240 hour(s))  MRSA PCR Screening     Status: None   Collection Time: 03/03/18  4:02 AM  Result Value Ref Range Status   MRSA by PCR NEGATIVE NEGATIVE Final    Comment:        The GeneXpert MRSA Assay (FDA approved for NASAL specimens only), is one component of a comprehensive MRSA colonization surveillance program. It is not intended to diagnose MRSA infection nor to guide or monitor treatment for MRSA infections. Performed at [facility], [address] [place], [place] [address]   Culture, blood (Routine X 2) w Reflex to ID Panel     Status: None (Preliminary result)   Collection Time: 03/03/18 10:43 AM  Result Value Ref Range Status   Specimen Description BLOOD LEFT WRIST  Final   Special Requests   Final    BOTTLES DRAWN AEROBIC AND ANAEROBIC Blood Culture adequate volume   Culture   Final    NO  GROWTH 4 DAYS Performed at [facility], [address] [place], [place] [address]    Report Status PENDING  Incomplete  Culture, blood (Routine X 2) w Reflex to ID Panel     Status: None (Preliminary result)   Collection Time: 03/03/18 10:51 AM  Result Value Ref Range Status   Specimen Description BLOOD LEFT HAND  Final   Special Requests   Final    BOTTLES DRAWN AEROBIC AND ANAEROBIC Blood Culture adequate volume   Culture   Final    NO GROWTH 4 DAYS Performed at [facility], [address] [place], [place] [address]    Report Status PENDING  Incomplete  Culture, respiratory     Status: None   Collection Time: 03/03/18 11:48 AM  Result Value Ref Range Status   Specimen Description   Final    TRACHEAL ASPIRATE Performed at [facility], [address], [place], [place] [address]    Special Requests   Final    NONE Performed at [facility], [address], [place], [place] [address]    Gram Stain   Final    ABUNDANT WBC PRESENT, PREDOMINANTLY PMN RARE GRAM POSITIVE COCCI    Culture   Final    RARE Consistent with normal respiratory flora. Performed at [facility], [address] [place], [place] [address]    Report Status 03/05/2018 FINAL  Final         Radiology Studies: No results found.      Scheduled Meds: . amoxicillin-clavulanate  1 tablet Oral Q12H  . carvedilol  6.25 mg Oral BID WC  . colchicine  0.6 mg Oral BID  . insulin aspart  0-15 Units Subcutaneous Q4H  . insulin glargine  70 Units Subcutaneous QHS  . ipratropium-albuterol  3 mL Nebulization TID  . mouth rinse  15 mL Mouth Rinse BID  . methylPREDNISolone (SOLU-MEDROL) injection  40 mg Intravenous Q12H  . mupirocin ointment  1 application Topical Daily  . potassium chloride SA  20 mEq Oral Daily  . rivaroxaban  20 mg Oral QPM  . sacubitril-valsartan  1 tablet Oral BID  . simvastatin  20 mg Oral q1800  . sodium chloride flush  3 mL Intravenous Q12H  . torsemide  20 mg Oral BID    Continuous Infusions: . sodium chloride    . famotidine (PEPCID) IV Stopped (03/07/18 1013)     LOS: 4 days    Time spent: 30 minutes    Uchenna Seufert Hoover Brunette, DO [facility] Pager  [phone]  If 7PM-7AM, please contact night-coverage [email or web address] Password [facility] 03/07/2018, 11:52 AM

## 2018-03-08 ENCOUNTER — Inpatient Hospital Stay
Admission: RE | Admit: 2018-03-08 | Discharge: 2018-03-31 | Disposition: A | Discharge status: Skilled Nursing Facility | Payer: Medicare Other | Source: Ambulatory Visit | Attending: Internal Medicine | Admitting: Internal Medicine

## 2018-03-08 DIAGNOSIS — F450 Somatization disorder: Secondary | ICD-10-CM | POA: Diagnosis not present

## 2018-03-08 DIAGNOSIS — Z7901 Long term (current) use of anticoagulants: Secondary | ICD-10-CM | POA: Diagnosis not present

## 2018-03-08 DIAGNOSIS — M6281 Muscle weakness (generalized): Secondary | ICD-10-CM | POA: Diagnosis not present

## 2018-03-08 DIAGNOSIS — R112 Nausea with vomiting, unspecified: Secondary | ICD-10-CM | POA: Diagnosis not present

## 2018-03-08 DIAGNOSIS — N289 Disorder of kidney and ureter, unspecified: Secondary | ICD-10-CM | POA: Diagnosis not present

## 2018-03-08 DIAGNOSIS — J449 Chronic obstructive pulmonary disease, unspecified: Secondary | ICD-10-CM | POA: Diagnosis not present

## 2018-03-08 DIAGNOSIS — I5022 Chronic systolic (congestive) heart failure: Secondary | ICD-10-CM | POA: Diagnosis not present

## 2018-03-08 DIAGNOSIS — N200 Calculus of kidney: Secondary | ICD-10-CM | POA: Diagnosis not present

## 2018-03-08 DIAGNOSIS — R262 Difficulty in walking, not elsewhere classified: Secondary | ICD-10-CM | POA: Diagnosis not present

## 2018-03-08 DIAGNOSIS — J690 Pneumonitis due to inhalation of food and vomit: Secondary | ICD-10-CM | POA: Diagnosis not present

## 2018-03-08 DIAGNOSIS — R110 Nausea: Secondary | ICD-10-CM | POA: Diagnosis not present

## 2018-03-08 DIAGNOSIS — Z794 Long term (current) use of insulin: Secondary | ICD-10-CM | POA: Diagnosis not present

## 2018-03-08 DIAGNOSIS — M6289 Other specified disorders of muscle: Secondary | ICD-10-CM | POA: Diagnosis not present

## 2018-03-08 DIAGNOSIS — J9602 Acute respiratory failure with hypercapnia: Secondary | ICD-10-CM | POA: Diagnosis not present

## 2018-03-08 DIAGNOSIS — I10 Essential (primary) hypertension: Secondary | ICD-10-CM | POA: Diagnosis not present

## 2018-03-08 DIAGNOSIS — M79672 Pain in left foot: Secondary | ICD-10-CM | POA: Diagnosis not present

## 2018-03-08 DIAGNOSIS — I952 Hypotension due to drugs: Secondary | ICD-10-CM | POA: Diagnosis not present

## 2018-03-08 DIAGNOSIS — J9611 Chronic respiratory failure with hypoxia: Secondary | ICD-10-CM | POA: Diagnosis not present

## 2018-03-08 DIAGNOSIS — I5023 Acute on chronic systolic (congestive) heart failure: Secondary | ICD-10-CM | POA: Diagnosis not present

## 2018-03-08 DIAGNOSIS — I482 Chronic atrial fibrillation: Secondary | ICD-10-CM | POA: Diagnosis not present

## 2018-03-08 DIAGNOSIS — E1143 Type 2 diabetes mellitus with diabetic autonomic (poly)neuropathy: Secondary | ICD-10-CM | POA: Diagnosis not present

## 2018-03-08 DIAGNOSIS — K521 Toxic gastroenteritis and colitis: Secondary | ICD-10-CM | POA: Diagnosis not present

## 2018-03-08 DIAGNOSIS — J9601 Acute respiratory failure with hypoxia: Secondary | ICD-10-CM | POA: Diagnosis not present

## 2018-03-08 DIAGNOSIS — Z952 Presence of prosthetic heart valve: Secondary | ICD-10-CM | POA: Diagnosis not present

## 2018-03-08 DIAGNOSIS — I9589 Other hypotension: Secondary | ICD-10-CM | POA: Diagnosis not present

## 2018-03-08 DIAGNOSIS — T3695XA Adverse effect of unspecified systemic antibiotic, initial encounter: Secondary | ICD-10-CM | POA: Diagnosis not present

## 2018-03-08 DIAGNOSIS — I428 Other cardiomyopathies: Secondary | ICD-10-CM | POA: Diagnosis not present

## 2018-03-08 DIAGNOSIS — E118 Type 2 diabetes mellitus with unspecified complications: Secondary | ICD-10-CM | POA: Diagnosis not present

## 2018-03-08 DIAGNOSIS — E785 Hyperlipidemia, unspecified: Secondary | ICD-10-CM | POA: Diagnosis not present

## 2018-03-08 DIAGNOSIS — Z953 Presence of xenogenic heart valve: Secondary | ICD-10-CM | POA: Diagnosis not present

## 2018-03-08 DIAGNOSIS — E861 Hypovolemia: Secondary | ICD-10-CM | POA: Diagnosis not present

## 2018-03-08 DIAGNOSIS — R195 Other fecal abnormalities: Secondary | ICD-10-CM | POA: Diagnosis not present

## 2018-03-08 DIAGNOSIS — F329 Major depressive disorder, single episode, unspecified: Secondary | ICD-10-CM | POA: Diagnosis not present

## 2018-03-08 DIAGNOSIS — K3184 Gastroparesis: Secondary | ICD-10-CM | POA: Diagnosis not present

## 2018-03-08 DIAGNOSIS — K529 Noninfective gastroenteritis and colitis, unspecified: Secondary | ICD-10-CM | POA: Diagnosis not present

## 2018-03-08 LAB — CULTURE, BLOOD (ROUTINE X 2)
Culture: NO GROWTH
Culture: NO GROWTH
Special Requests: ADEQUATE
Special Requests: ADEQUATE

## 2018-03-08 LAB — GLUCOSE, CAPILLARY
Glucose-Capillary: 122 mg/dL — ABNORMAL HIGH (ref 70–99)
Glucose-Capillary: 132 mg/dL — ABNORMAL HIGH (ref 70–99)
Glucose-Capillary: 132 mg/dL — ABNORMAL HIGH (ref 70–99)
Glucose-Capillary: 161 mg/dL — ABNORMAL HIGH (ref 70–99)

## 2018-03-08 MED ORDER — FAMOTIDINE 20 MG PO TABS
20.0000 mg | ORAL_TABLET | Freq: Two times a day (BID) | ORAL | Status: DC
  Administered 2018-03-08: 20 mg via ORAL
  Filled 2018-03-08: qty 1

## 2018-03-08 MED ORDER — OXYCODONE HCL 5 MG PO TABS: 5.0000 mg | ORAL_TABLET | ORAL | 0 refills | Status: AC | PRN

## 2018-03-08 MED ORDER — PREDNISONE 20 MG PO TABS: 40.0000 mg | ORAL_TABLET | Freq: Every day | ORAL | 0 refills | Status: AC

## 2018-03-08 MED ORDER — AMOXICILLIN-POT CLAVULANATE 875-125 MG PO TABS: 1.0000 | ORAL_TABLET | Freq: Two times a day (BID) | ORAL | 0 refills | Status: AC

## 2018-03-08 NOTE — Discharge Summary (Signed)
Physician Discharge Summary  Cody Hale STM:[id number] DOB: 1950-08-02 DOA: 03/02/2018  PCP: [name], MD  Admit date: 03/02/2018  Discharge date: 03/08/2018  Admitted From:Home  Disposition:  [facility]  Recommendations for Outpatient Follow-up:  1. Follow up with PCP in 1-2 weeks 2. Continue Augmentin for 2 more days to finish the course of treatment along with prednisone for 5 more days.  Home Health:N/A  Equipment/Devices:N/A  Discharge Condition:Stable  CODE STATUS: Full  Diet recommendation: Heart Healthy  Brief/Interim Summary:  67 year old man admitted from home on 8/26 due to nausea and vomiting. He has a history of chronic atrial fibrillation on chronic anticoagulation with Xarelto, chronic systolic heart failure with ejection fraction of 30 to 35%, he has a bioprosthetic mitral valve, COPD and hypertension. He developed nausea and dry heaving after eating lunch on the day of admission. In the emergency department he was found to have significant shortness of breath and blood glass showed respiratory acidosis with a PCO2 in the 80s. He was started on BiPAP. He subsequently vomited and initial plans were made for intubation, however because he stabilized, was alert and oriented and was saturating in the 90s intubation plans were deferred. A few hours later he further decompensated with repeat emesis into the BiPAP mask and this time did not recover. Anesthesia was called for intubation this was performed at around 7:52 AM on 8/27.  He was noted to have developed aspiration pneumonia and was started on IV Zosyn.  He was also noted to have pulmonary edema for which Lasix was started, but then was discontinued shortly after since he did develop some AKI.  He has not been transitioned to oral Augmentin as well as his usual home Demadex which he is tolerating well.  He has been weaned off of his oxygen on this day of discharge and denies any further complaints or  concerns.  He has been seen by PT with recommendations for inpatient rehabilitation placement as he had developed some weakness from his critical illness.  No other acute events have been noted during the course of this admission.  Discharge Diagnoses:  Principal Problem:   Acute respiratory failure with hypoxia and hypercapnia (HCC) Active Problems:   Type II diabetes mellitus with manifestations (HCC)   Chronic atrial fibrillation (HCC)   Acute respiratory failure (HCC)  1. Acute hypoxemic and hypercarbic respiratory failure likely secondary to aspiration pneumonia, acute COPD exacerbation, and acute on chronic systolic heart failure-resolved.Patient on room air.  Continue Augmentin as prescribed for 2 more days along with prednisone for 5 more days.   2. AKI. Secondary to aggressive diuresis-resolved. 3. Atrial fibrillation with RVR-resolved. Per cardiology,continue Coreg and Xarelto along with resumption of Entresto and statin. 4. Acute on chronic systolic heart failure. Noted to have LVEF of 30 to 35% with dyskinesis on 2D echocardiogram from 11/2017.   Continue on Demadex and monitor daily weights. 5. Insulin-dependent diabetes with hyperglycemia-improving. Continue home medications and monitor. 6. Status post right hip arthroplasty. This was approximately 50 days ago and is currently stable.PT recommendations to continue aggressive [facility] at [facility].  Discharge Instructions  Discharge Instructions    Diet - low sodium heart healthy   Complete by:  As directed    Increase activity slowly   Complete by:  As directed      Allergies as of 03/08/2018      Reactions   Daptomycin Rash   Lisinopril Cough   Tape Rash, Other (See Comments)  Adhesive Tape-Burn skin.      Medication List    STOP taking these medications   methocarbamol 500 MG tablet Commonly known as:  ROBAXIN     TAKE these medications   amoxicillin-clavulanate 875-125 MG tablet Commonly  known as:  AUGMENTIN Take 1 tablet by mouth every 12 (twelve) hours for 2 days.   carvedilol 6.25 MG tablet Commonly known as:  COREG TAKE 1 TABLET BY MOUTH TWICE DAILY. What changed:  when to take this   colchicine 0.6 MG tablet Take 0.6 mg by mouth 2 (two) times daily.   diphenoxylate-atropine 2.5-0.025 MG tablet Commonly known as:  LOMOTIL Take 1 tablet by mouth 4 (four) times daily as needed for diarrhea or loose stools.   fluticasone 50 MCG/ACT nasal spray Commonly known as:  FLONASE Place 2 sprays into both nostrils daily.   guaiFENesin 100 MG/5ML liquid Commonly known as:  ROBITUSSIN Take 100 mg by mouth 3 (three) times daily as needed for cough.   insulin aspart 100 UNIT/ML injection Commonly known as:  novoLOG Inject 5 Units into the skin 2 (two) times daily. Inject 5 units subcutaneously twice daily before lunch and supper hold if BX <150 same as human insulin   INVOKANA 100 MG Tabs tablet Generic drug:  canagliflozin TAKE ONE TABLET BY MOUTH ONCE DAILY BEFORE BREAKFAST. What changed:  See the new instructions.   lactobacillus acidophilus Tabs tablet Take 1 tablet by mouth 2 (two) times daily.   RISA-BID PROBIOTIC Tabs Take 1 tablet by mouth 2 (two) times daily.   LANTUS SOLOSTAR 100 UNIT/ML Solostar Pen Generic drug:  Insulin Glargine INJECT 70 UNITS SUBCUTANEOUSLY AT 10pm. What changed:  See the new instructions.   levocetirizine 5 MG tablet Commonly known as:  XYZAL Take 1 tablet (5 mg total) by mouth every evening.   metFORMIN 750 MG 24 hr tablet Commonly known as:  GLUCOPHAGE-XR TAKE 2 TABLETS BY MOUTH EACH MORNING WITH BREAKFAST. What changed:  See the new instructions.   mupirocin ointment 2 % Commonly known as:  BACTROBAN PLACE A SMALL AMOUT TO TOP OF INCITION AT RIGHT HIP & DRY DRESSING DAILY. What changed:  See the new instructions.   nitroGLYCERIN 0.4 MG SL tablet Commonly known as:  NITROSTAT Place 1 tablet (0.4 mg total) under the  tongue every 5 (five) minutes as needed for chest pain. Max 3 doses.   ondansetron 4 MG tablet Commonly known as:  ZOFRAN Take 4 mg by mouth every 8 (eight) hours as needed for nausea or vomiting.   oxyCODONE 5 MG immediate release tablet Commonly known as:  Oxy IR/ROXICODONE Take 1-2 tablets (5-10 mg total) by mouth every 4 (four) hours as needed for up to 3 days for moderate pain (pain score 4-6).   polyethylene glycol packet Commonly known as:  MIRALAX / GLYCOLAX Take 17 g by mouth daily as needed.   potassium chloride SA 20 MEQ tablet Commonly known as:  K-DUR,KLOR-CON Take 20 mEq by mouth daily.   predniSONE 20 MG tablet Commonly known as:  DELTASONE Take 2 tablets (40 mg total) by mouth daily with breakfast for 5 days.   sacubitril-valsartan 24-26 MG Commonly known as:  ENTRESTO Take 1 tablet by mouth 2 (two) times daily.   simvastatin 20 MG tablet Commonly known as:  ZOCOR TAKE 1 TABLET BY MOUTH ONCE DAILY. What changed:  when to take this   THERA-M Tabs TAKE 1 TABLET BY MOUTH ONCE DAILY.   tiZANidine 4 MG tablet Commonly known  as:  ZANAFLEX Take 1 tablet (4 mg total) by mouth every 8 (eight) hours as needed for muscle spasms.   torsemide 20 MG tablet Commonly known as:  DEMADEX Take 20 mg by mouth 2 (two) times daily.   VIIBRYD 40 MG Tabs Generic drug:  Vilazodone HCl TAKE 1 TABLET BY MOUTH ONCE DAILY. What changed:  how much to take   XARELTO 20 MG Tabs tablet Generic drug:  rivaroxaban TAKE 1 TABLET BY MOUTH ONCE DAILY. What changed:    how much to take  when to take this      Contact information for after-discharge care    Destination    [facility] Preferred SNF .   Service:  Skilled Nursing Contact information: [address] [place] [address] [phone]             Allergies  Allergen Reactions  . Daptomycin Rash  . Lisinopril Cough  . Tape Rash and Other (See Comments)    Adhesive Tape-Burn  skin.    Consultations:  Pulmonology  Cardiology   Procedures/Studies: Portable Chest Xray  Result Date: 03/04/2018 CLINICAL DATA:  Respiratory failure. EXAM: PORTABLE CHEST 1 VIEW COMPARISON:  Radiograph of March 03, 2018. FINDINGS: Stable cardiomegaly. Endotracheal and nasogastric tubes are unchanged in position. No pneumothorax is noted. Stable bibasilar opacities are noted concerning for edema or infiltrates. Small bilateral pleural effusions cannot be excluded. Bony thorax is unremarkable. IMPRESSION: Stable support apparatus. Stable bibasilar opacities as described above. Electronically Signed   By: Lupita Raider, M.D.   On: 03/04/2018 07:31   Dg Chest Portable 1 View  Result Date: 03/03/2018 CLINICAL DATA:  67 year old male with shortness of breath. EXAM: PORTABLE CHEST 1 VIEW COMPARISON:  Chest radiograph dated 03/02/2018 FINDINGS: There is shallow inspiration with bibasilar atelectasis versus infiltrate. Probable small bilateral pleural effusions. The right costophrenic angle has been excluded from the image. Overall there is poor inspiratory effort compared to the prior radiograph with increased bibasilar densities. No pneumothorax. Stable cardiomegaly. Median sternotomy wires. No acute osseous pathology. Old right rib fractures. IMPRESSION: Shallow inspiration with bibasilar atelectasis versus infiltrate and probable small bilateral pleural effusions. Electronically Signed   By: Elgie Collard M.D.   On: 03/03/2018 03:42   Dg Chest Portable 1 View  Result Date: 03/02/2018 CLINICAL DATA:  Shortness of breath with nausea and dry heaves. V-tach. EXAM: PORTABLE CHEST 1 VIEW COMPARISON:  02/24/2018. FINDINGS: Cardiomegaly. Prior CABG. BILATERAL pulmonary opacities most consistent with pulmonary edema although BILATERAL infiltrates are not excluded. No pneumothorax. Old rib fractures. IMPRESSION: Cardiomegaly with BILATERAL pulmonary opacities most consistent with pulmonary edema.  Electronically Signed   By: Elsie Stain M.D.   On: 03/02/2018 19:04   Dg Chest Port 1 View  Result Date: 02/24/2018 CLINICAL DATA:  Hypotension EXAM: PORTABLE CHEST 1 VIEW COMPARISON:  None. FINDINGS: There is mild bilateral interstitial thickening. There is no focal parenchymal opacity. There is no pleural effusion or pneumothorax. There is stable cardiomegaly. There is evidence of prior median sternotomy. The osseous structures are unremarkable. IMPRESSION: No active disease.  Stable cardiomegaly. Electronically Signed   By: Elige Ko   On: 02/24/2018 11:12   Dg Chest Port 1v Same Day  Result Date: 03/04/2018 CLINICAL DATA:  Respiratory distress, atrial fibrillation, CHF, COPD, former smoker. EXAM: PORTABLE CHEST 1 VIEW COMPARISON:  Portable chest x-ray of Dec 02, 2017 FINDINGS: The lungs are adequately inflated. The interstitial markings are increased bilaterally. The retrocardiac region on the left  and the right infrahilar regions remain dense. The cardiac silhouette is enlarged and the pulmonary vascularity engorged. The sternal wires are intact. There is calcification in the wall of the aortic arch. A nasogastric tube is present whose tip projects below the inferior margin of the image. IMPRESSION: CHF with pulmonary interstitial and alveolar edema. Bibasilar atelectasis. One cannot exclude bibasilar pneumonia in the appropriate clinical setting. Thoracic aortic atherosclerosis. Electronically Signed   By: David  [place] M.D.   On: 03/04/2018 13:32   Dg Chest Port 1v Same Day  Result Date: 03/03/2018 CLINICAL DATA:  Nasogastric tube placement EXAM: PORTABLE CHEST 1 VIEW COMPARISON:  Portable exam 1355 hours compared 03/03/2018 at 0806 hours FINDINGS: Nasogastric tube coiled in stomach with tip extending below extent of film. Tip of endotracheal tube remains in satisfactory position 5.2 cm above the carina. Enlargement of cardiac silhouette post median sternotomy. Bibasilar infiltrates. No  pleural effusion or pneumothorax. IMPRESSION: Nasogastric tube extends into stomach, tip beyond a fear your extent of exam. Enlargement of cardiac silhouette with persistent bibasilar infiltrates. Electronically Signed   By: Ulyses Southward M.D.   On: 03/03/2018 14:14   Dg Chest Port 1v Same Day  Result Date: 03/03/2018 CLINICAL DATA:  Post intubation radiograph. EXAM: PORTABLE CHEST 1 VIEW COMPARISON:  Portable chest x-ray of today's date at 3:15 a.m. FINDINGS: The endotracheal tube tip projects approximately 6.5 cm above the carina. The tip is at the level of the inferior margin of the clavicular heads. Widespread interstitial densities are present with confluent densities at the lung bases. Small bilateral pleural effusions are present. The cardiac silhouette remains enlarged and the pulmonary vascularity engorged. The visualized sternal wires are intact. IMPRESSION: Interval intubation of the trachea. Advancement of the endotracheal tube by approximately 2-3 cm would be useful. Widespread interstitial and airspace opacities most compatible with pulmonary edema. Electronically Signed   By: David  [place] M.D.   On: 03/03/2018 08:33     Discharge Exam: Vitals:   03/08/18 0503 03/08/18 0757  BP: (!) 106/58   Pulse: 72   Resp: 12   Temp: 98.1 F (36.7 C)   SpO2: 98% 97%   Vitals:   03/07/18 2129 03/07/18 2130 03/08/18 0503 03/08/18 0757  BP: (!) 110/59  (!) 106/58   Pulse: 66  72   Resp: 18  12   Temp: 98.8 F (37.1 C)  98.1 F (36.7 C)   TempSrc: Oral  Oral   SpO2: 100% 98% 98% 97%  Weight:   117.6 kg   Height:        General: Pt is alert, awake, not in acute distress Cardiovascular: RRR, S1/S2 +, no rubs, no gallops Respiratory: CTA bilaterally, no wheezing, no rhonchi Abdominal: Soft, NT, ND, bowel sounds + Extremities: no edema, no cyanosis    The results of significant diagnostics from this hospitalization (including imaging, microbiology, ancillary and laboratory) are listed  below for reference.     Microbiology: Recent Results (from the past 240 hour(s))  MRSA PCR Screening     Status: None   Collection Time: 03/03/18  4:02 AM  Result Value Ref Range Status   MRSA by PCR NEGATIVE NEGATIVE Final    Comment:        The GeneXpert MRSA Assay (FDA approved for NASAL specimens only), is one component of a comprehensive MRSA colonization surveillance program. It is not intended to diagnose MRSA infection nor to guide or monitor treatment for MRSA infections. Performed at [facility], [address]  [place], [place] [address]   Culture, blood (Routine X 2) w Reflex to ID Panel     Status: None   Collection Time: 03/03/18 10:43 AM  Result Value Ref Range Status   Specimen Description BLOOD LEFT WRIST  Final   Special Requests   Final    BOTTLES DRAWN AEROBIC AND ANAEROBIC Blood Culture adequate volume   Culture   Final    NO GROWTH 5 DAYS Performed at [facility], [address] [place], [place] [address]    Report Status 03/08/2018 FINAL  Final  Culture, blood (Routine X 2) w Reflex to ID Panel     Status: None   Collection Time: 03/03/18 10:51 AM  Result Value Ref Range Status   Specimen Description BLOOD LEFT HAND  Final   Special Requests   Final    BOTTLES DRAWN AEROBIC AND ANAEROBIC Blood Culture adequate volume   Culture   Final    NO GROWTH 5 DAYS Performed at [facility], [address], [place], [place] [address]    Report Status 03/08/2018 FINAL  Final  Culture, respiratory     Status: None   Collection Time: 03/03/18 11:48 AM  Result Value Ref Range Status   Specimen Description   Final    TRACHEAL ASPIRATE Performed at [facility], [address] [place], [place] [address]    Special Requests   Final    NONE Performed at [facility], [address] [place], [place] [address]    Gram Stain   Final    ABUNDANT WBC PRESENT, PREDOMINANTLY PMN RARE GRAM POSITIVE COCCI    Culture   Final    RARE Consistent with normal  respiratory flora. Performed at [facility], [address], [place], [place] [address]    Report Status 03/05/2018 FINAL  Final     Labs: BNP (last 3 results) Recent Labs    02/24/18 1118 03/02/18 1835  BNP 219.0* 435.0*   Basic Metabolic Panel: Recent Labs  Lab 03/02/18 1835 03/02/18 1837 03/03/18 0431 03/04/18 0545 03/05/18 0406 03/06/18 0413  NA 141 142 142 144 145 141  K 4.2 4.2 5.2* 4.5 4.3 4.4  CL 105 103 107 106 105 105  CO2 26  --  25 27 26  32  GLUCOSE 204* 201* 232* 221* 216* 155*  BUN 16 17 22  39* 41* 30*  CREATININE 0.96 0.90 1.31* 1.89* 1.25* 0.91  CALCIUM 9.1  --  9.1 9.0 9.2 9.0  MG 2.1  --   --   --  2.3  --    Liver Function Tests: Recent Labs  Lab 03/02/18 1835 03/03/18 0431  AST 28 27  ALT 24 22  ALKPHOS 90 96  BILITOT 1.1 1.0  PROT 7.5 7.3  ALBUMIN 4.0 3.7   Recent Labs  Lab 03/02/18 1835  LIPASE 23   No results for input(s): AMMONIA in the last 168 hours. CBC: Recent Labs  Lab 03/02/18 1835 03/02/18 1837 03/03/18 0431 03/04/18 0545 03/06/18 0413  WBC 7.0  --  15.4* 12.7* 8.0  NEUTROABS 6.2  --   --   --   --   HGB 14.9 16.0 16.0 13.4 11.9*  HCT 46.2 47.0 50.7 42.0 38.0*  MCV 97.5  --  100.0 97.7 98.2  PLT 160  --  184 146* 122*   Cardiac Enzymes: Recent Labs  Lab 03/02/18 1835  TROPONINI <0.03   BNP: Invalid input(s): POCBNP CBG: Recent Labs  Lab 03/07/18 2219 03/08/18 0023 03/08/18  0500 03/08/18 0720 03/08/18 1115  GLUCAP 155* 161* 122* 132* 132*   D-Dimer No results for input(s): DDIMER in the last 72 hours. Hgb A1c No results for input(s): HGBA1C in the last 72 hours. Lipid Profile No results for input(s): CHOL, HDL, LDLCALC, TRIG, CHOLHDL, LDLDIRECT in the last 72 hours. Thyroid function studies No results for input(s): TSH, T4TOTAL, T3FREE, THYROIDAB in the last 72 hours.  Invalid input(s): FREET3 Anemia work up No results for input(s): VITAMINB12, FOLATE, FERRITIN, TIBC, IRON, RETICCTPCT  in the last 72 hours. Urinalysis    Component Value Date/Time   COLORURINE YELLOW 03/03/2018 0430   APPEARANCEUR HAZY (A) 03/03/2018 0430   APPEARANCEUR Clear 07/23/2017   LABSPEC 1.014 03/03/2018 0430   PHURINE 5.0 03/03/2018 0430   GLUCOSEU >=500 (A) 03/03/2018 0430   GLUCOSEU 500 (A) 07/14/2017 1053   HGBUR NEGATIVE 03/03/2018 0430   BILIRUBINUR NEGATIVE 03/03/2018 0430   BILIRUBINUR Negative 07/23/2017   KETONESUR 5 (A) 03/03/2018 0430   PROTEINUR 100 (A) 03/03/2018 0430   UROBILINOGEN 0.2 07/14/2017 1053   NITRITE NEGATIVE 03/03/2018 0430   LEUKOCYTESUR NEGATIVE 03/03/2018 0430   LEUKOCYTESUR Negative (A) 07/23/2017   Sepsis Labs Invalid input(s): PROCALCITONIN,  WBC,  LACTICIDVEN Microbiology Recent Results (from the past 240 hour(s))  MRSA PCR Screening     Status: None   Collection Time: 03/03/18  4:02 AM  Result Value Ref Range Status   MRSA by PCR NEGATIVE NEGATIVE Final    Comment:        The GeneXpert MRSA Assay (FDA approved for NASAL specimens only), is one component of a comprehensive MRSA colonization surveillance program. It is not intended to diagnose MRSA infection nor to guide or monitor treatment for MRSA infections. Performed at [facility], [address] [place], [place] [address]   Culture, blood (Routine X 2) w Reflex to ID Panel     Status: None   Collection Time: 03/03/18 10:43 AM  Result Value Ref Range Status   Specimen Description BLOOD LEFT WRIST  Final   Special Requests   Final    BOTTLES DRAWN AEROBIC AND ANAEROBIC Blood Culture adequate volume   Culture   Final    NO GROWTH 5 DAYS Performed at [facility], [address], [place], [place] [address]    Report Status 03/08/2018 FINAL  Final  Culture, blood (Routine X 2) w Reflex to ID Panel     Status: None   Collection Time: 03/03/18 10:51 AM  Result Value Ref Range Status   Specimen Description BLOOD LEFT HAND  Final   Special Requests   Final    BOTTLES DRAWN AEROBIC AND  ANAEROBIC Blood Culture adequate volume   Culture   Final    NO GROWTH 5 DAYS Performed at [facility], [address], [place], [place] [address]    Report Status 03/08/2018 FINAL  Final  Culture, respiratory     Status: None   Collection Time: 03/03/18 11:48 AM  Result Value Ref Range Status   Specimen Description   Final    TRACHEAL ASPIRATE Performed at [facility], [address] [place], [place] [address]    Special Requests   Final    NONE Performed at [facility], [address], [place], [place] [address]    Gram Stain   Final    ABUNDANT WBC PRESENT, PREDOMINANTLY PMN RARE GRAM POSITIVE COCCI    Culture   Final    RARE Consistent with normal respiratory flora. Performed at [facility], [address] [place], [place] [address]    Report Status 03/05/2018 FINAL  Final     Time coordinating discharge: 40 minutes  SIGNED:   Erick Blinks, DO [facility] 03/08/2018, 11:52 AM Pager [phone]  If 7PM-7AM, please contact night-coverage [email or web address] Password [email or web address]

## 2018-03-08 NOTE — Progress Notes (Addendum)
Patient will Discharge To: [facility] Anticipated DC Date:03/08/18 Family Notified: yes,  Meribeth Mattes, daughter [phone] Transport By: RN   Per MD patient ready for DC to [facility] . RN, patient, patient's family, and facility notified of DC. Assessment, Fl2/Pasrr, and Discharge Summary sent to facility. RN given number for report [phone] ask for [facility], Room # 128). DC packet on chart. Nursing staff will transport to facility with 2 liters of oxygen. CSW signing off.  Budd Palmer LCSWA [phone]

## 2018-03-09 ENCOUNTER — Encounter: Payer: Self-pay | Admitting: Internal Medicine

## 2018-03-09 ENCOUNTER — Encounter (HOSPITAL_COMMUNITY)
Admission: RE | Admit: 2018-03-09 | Discharge: 2018-03-09 | Disposition: A | Discharge status: Home / Self Care | Payer: Medicare Other | Source: Skilled Nursing Facility | Attending: Internal Medicine | Admitting: Internal Medicine

## 2018-03-09 ENCOUNTER — Non-Acute Institutional Stay (SKILLED_NURSING_FACILITY): Payer: Medicare Other | Admitting: Internal Medicine

## 2018-03-09 DIAGNOSIS — Z794 Long term (current) use of insulin: Secondary | ICD-10-CM | POA: Diagnosis not present

## 2018-03-09 DIAGNOSIS — I5022 Chronic systolic (congestive) heart failure: Secondary | ICD-10-CM

## 2018-03-09 DIAGNOSIS — F450 Somatization disorder: Secondary | ICD-10-CM | POA: Diagnosis not present

## 2018-03-09 DIAGNOSIS — J9601 Acute respiratory failure with hypoxia: Secondary | ICD-10-CM

## 2018-03-09 DIAGNOSIS — F329 Major depressive disorder, single episode, unspecified: Secondary | ICD-10-CM | POA: Diagnosis not present

## 2018-03-09 DIAGNOSIS — E118 Type 2 diabetes mellitus with unspecified complications: Secondary | ICD-10-CM

## 2018-03-09 DIAGNOSIS — I482 Chronic atrial fibrillation, unspecified: Secondary | ICD-10-CM

## 2018-03-09 DIAGNOSIS — J9602 Acute respiratory failure with hypercapnia: Secondary | ICD-10-CM | POA: Diagnosis not present

## 2018-03-09 DIAGNOSIS — J449 Chronic obstructive pulmonary disease, unspecified: Secondary | ICD-10-CM

## 2018-03-09 DIAGNOSIS — I10 Essential (primary) hypertension: Secondary | ICD-10-CM | POA: Insufficient documentation

## 2018-03-09 LAB — CBC WITH DIFFERENTIAL/PLATELET
Basophils Absolute: 0 10*3/uL (ref 0.0–0.1)
Basophils Relative: 0 %
Eosinophils Absolute: 0 10*3/uL (ref 0.0–0.7)
Eosinophils Relative: 0 %
HCT: 47.9 % (ref 39.0–52.0)
Hemoglobin: 15.8 g/dL (ref 13.0–17.0)
Lymphocytes Relative: 15 %
Lymphs Abs: 1.7 10*3/uL (ref 0.7–4.0)
MCH: 31.4 pg (ref 26.0–34.0)
MCHC: 33 g/dL (ref 30.0–36.0)
MCV: 95.2 fL (ref 78.0–100.0)
Monocytes Absolute: 0.8 10*3/uL (ref 0.1–1.0)
Monocytes Relative: 7 %
Neutro Abs: 8.5 10*3/uL — ABNORMAL HIGH (ref 1.7–7.7)
Neutrophils Relative %: 78 %
Platelets: 150 10*3/uL (ref 150–400)
RBC: 5.03 MIL/uL (ref 4.22–5.81)
RDW: 14 % (ref 11.5–15.5)
WBC: 11 10*3/uL — ABNORMAL HIGH (ref 4.0–10.5)

## 2018-03-09 LAB — BASIC METABOLIC PANEL
Anion gap: 9 (ref 5–15)
BUN: 35 mg/dL — ABNORMAL HIGH (ref 8–23)
CO2: 31 mmol/L (ref 22–32)
Calcium: 8.9 mg/dL (ref 8.9–10.3)
Chloride: 99 mmol/L (ref 98–111)
Creatinine, Ser: 0.98 mg/dL (ref 0.61–1.24)
GFR calc Af Amer: >60 mL/min (ref >60)
GFR calc non Af Amer: >60 mL/min (ref >60)
Glucose, Bld: 49 mg/dL — ABNORMAL LOW (ref 70–99)
Potassium: 3.1 mmol/L — ABNORMAL LOW (ref 3.5–5.1)
Sodium: 139 mmol/L (ref 135–145)
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
## 2018-03-10 ENCOUNTER — Other Ambulatory Visit: Payer: Self-pay

## 2018-03-10 ENCOUNTER — Non-Acute Institutional Stay (SKILLED_NURSING_FACILITY): Payer: Medicare Other | Admitting: Internal Medicine

## 2018-03-10 ENCOUNTER — Other Ambulatory Visit (INDEPENDENT_AMBULATORY_CARE_PROVIDER_SITE_OTHER): Payer: Self-pay | Admitting: Orthopaedic Surgery

## 2018-03-10 ENCOUNTER — Other Ambulatory Visit: Payer: Self-pay | Admitting: *Deleted

## 2018-03-10 ENCOUNTER — Encounter: Payer: Self-pay | Admitting: Internal Medicine

## 2018-03-10 ENCOUNTER — Telehealth: Payer: Self-pay | Admitting: *Deleted

## 2018-03-10 DIAGNOSIS — K521 Toxic gastroenteritis and colitis: Secondary | ICD-10-CM

## 2018-03-10 DIAGNOSIS — J9602 Acute respiratory failure with hypercapnia: Secondary | ICD-10-CM

## 2018-03-10 DIAGNOSIS — Z794 Long term (current) use of insulin: Secondary | ICD-10-CM

## 2018-03-10 DIAGNOSIS — E785 Hyperlipidemia, unspecified: Secondary | ICD-10-CM

## 2018-03-10 DIAGNOSIS — I482 Chronic atrial fibrillation, unspecified: Secondary | ICD-10-CM

## 2018-03-10 DIAGNOSIS — E118 Type 2 diabetes mellitus with unspecified complications: Secondary | ICD-10-CM | POA: Diagnosis not present

## 2018-03-10 DIAGNOSIS — J690 Pneumonitis due to inhalation of food and vomit: Secondary | ICD-10-CM

## 2018-03-10 DIAGNOSIS — K3184 Gastroparesis: Secondary | ICD-10-CM

## 2018-03-10 DIAGNOSIS — T3695XA Adverse effect of unspecified systemic antibiotic, initial encounter: Principal | ICD-10-CM

## 2018-03-10 DIAGNOSIS — I5022 Chronic systolic (congestive) heart failure: Secondary | ICD-10-CM

## 2018-03-10 DIAGNOSIS — J9601 Acute respiratory failure with hypoxia: Secondary | ICD-10-CM

## 2018-03-10 MED ORDER — DIPHENOXYLATE-ATROPINE 2.5-0.025 MG PO TABS: 1.0000 | ORAL_TABLET | Freq: Four times a day (QID) | ORAL | 0 refills | Status: DC | PRN

## 2018-03-10 NOTE — Patient Outreach (Signed)
[facility]) Care Management  03/10/2018  LAIKEN NOHR Sep 19, 1950 [id number]   Onsite visit to facility [facility]) Met with [place], [place]. She reports patient is doing well. He is from [facility]. She reports he plans to return there upon discharge. Met with Patient and his sister.  He reports he is from ALF and has been there 3 years. He plans to return there.  He reports they manage his medications, meals, and transportation. They also have home care services if needed.   He denies any [facility] care management program services.  RNCM left a [facility] brochure and RNCM contact information.  Plan to collaborate with [facility] as indicated Will sign off at this time. Royetta Crochet. Laymond Purser, RN, BSN, [place] [phone]) Business Cell  [phone]) Toll Free Office

## 2018-03-10 NOTE — Telephone Encounter (Signed)
RX Fax for [facility]@ [phone]  

## 2018-03-10 NOTE — Telephone Encounter (Signed)
Pt was on TCM report admitted 03/02/18 Acute respiratory failure with hypoxia and hypercapnia. He was been seen by PT and recommended for inpatient rehabilitation placement as he had developed some weakness from his critical illness. Pt D/C 03/08/18 to [facility]. Per summary after discharge from SNF will need to f/u w/PCP 7-14 days.Marland KitchenRaechel Chute
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
## 2018-03-11 ENCOUNTER — Encounter (HOSPITAL_COMMUNITY)
Admission: RE | Admit: 2018-03-11 | Discharge: 2018-03-11 | Disposition: A | Discharge status: Home / Self Care | Payer: Medicare Other | Source: Skilled Nursing Facility | Attending: Internal Medicine | Admitting: Internal Medicine

## 2018-03-11 DIAGNOSIS — J690 Pneumonitis due to inhalation of food and vomit: Secondary | ICD-10-CM | POA: Insufficient documentation

## 2018-03-11 DIAGNOSIS — J9611 Chronic respiratory failure with hypoxia: Secondary | ICD-10-CM | POA: Insufficient documentation

## 2018-03-11 DIAGNOSIS — M6289 Other specified disorders of muscle: Secondary | ICD-10-CM | POA: Insufficient documentation

## 2018-03-11 DIAGNOSIS — I110 Hypertensive heart disease with heart failure: Secondary | ICD-10-CM | POA: Insufficient documentation

## 2018-03-11 DIAGNOSIS — I5023 Acute on chronic systolic (congestive) heart failure: Secondary | ICD-10-CM | POA: Insufficient documentation

## 2018-03-11 LAB — MAGNESIUM: Magnesium: 2.1 mg/dL (ref 1.7–2.4)

## 2018-03-11 LAB — BASIC METABOLIC PANEL
Anion gap: 7 (ref 5–15)
BUN: 28 mg/dL — ABNORMAL HIGH (ref 8–23)
CO2: 36 mmol/L — ABNORMAL HIGH (ref 22–32)
Calcium: 8.8 mg/dL — ABNORMAL LOW (ref 8.9–10.3)
Chloride: 98 mmol/L (ref 98–111)
Creatinine, Ser: 0.87 mg/dL (ref 0.61–1.24)
GFR calc Af Amer: >60 mL/min (ref >60)
GFR calc non Af Amer: >60 mL/min (ref >60)
Glucose, Bld: 77 mg/dL (ref 70–99)
Potassium: 3.3 mmol/L — ABNORMAL LOW (ref 3.5–5.1)
Sodium: 141 mmol/L (ref 135–145)

## 2018-03-12 ENCOUNTER — Other Ambulatory Visit: Payer: Self-pay

## 2018-03-12 ENCOUNTER — Other Ambulatory Visit (HOSPITAL_COMMUNITY)
Admission: RE | Admit: 2018-03-12 | Discharge: 2018-03-12 | Disposition: A | Discharge status: Home / Self Care | Payer: Medicare Other | Source: Skilled Nursing Facility | Attending: Internal Medicine | Admitting: Internal Medicine

## 2018-03-12 DIAGNOSIS — R195 Other fecal abnormalities: Secondary | ICD-10-CM | POA: Insufficient documentation

## 2018-03-12 LAB — C DIFFICILE QUICK SCREEN W PCR REFLEX
C Diff antigen: NEGATIVE
C Diff interpretation: NOT DETECTED
C Diff toxin: NEGATIVE

## 2018-03-12 MED ORDER — OXYCODONE HCL 5 MG PO TABS: ORAL_TABLET | ORAL | 0 refills | Status: DC

## 2018-03-12 NOTE — Telephone Encounter (Signed)
RX Fax for [facility]@ [phone]  

## 2018-03-13 ENCOUNTER — Encounter (HOSPITAL_COMMUNITY)
Admission: RE | Admit: 2018-03-13 | Discharge: 2018-03-13 | Disposition: A | Discharge status: Home / Self Care | Payer: Medicare Other | Source: Skilled Nursing Facility | Attending: Internal Medicine | Admitting: Internal Medicine

## 2018-03-13 LAB — BASIC METABOLIC PANEL
Anion gap: 9 (ref 5–15)
BUN: 38 mg/dL — ABNORMAL HIGH (ref 8–23)
CO2: 35 mmol/L — ABNORMAL HIGH (ref 22–32)
Calcium: 8.8 mg/dL — ABNORMAL LOW (ref 8.9–10.3)
Chloride: 95 mmol/L — ABNORMAL LOW (ref 98–111)
Creatinine, Ser: 1.43 mg/dL — ABNORMAL HIGH (ref 0.61–1.24)
GFR calc Af Amer: 57 mL/min — ABNORMAL LOW (ref >60)
GFR calc non Af Amer: 49 mL/min — ABNORMAL LOW (ref >60)
Glucose, Bld: 65 mg/dL — ABNORMAL LOW (ref 70–99)
Potassium: 3.3 mmol/L — ABNORMAL LOW (ref 3.5–5.1)
Sodium: 139 mmol/L (ref 135–145)

## 2018-03-14 LAB — C DIFFICILE QUICK SCREEN W PCR REFLEX
C Diff antigen: NEGATIVE
C Diff interpretation: NOT DETECTED
C Diff toxin: NEGATIVE

## 2018-03-16 ENCOUNTER — Encounter (HOSPITAL_COMMUNITY)
Admission: RE | Admit: 2018-03-16 | Discharge: 2018-03-16 | Disposition: A | Discharge status: Home / Self Care | Payer: Medicare Other | Source: Skilled Nursing Facility | Attending: *Deleted | Admitting: *Deleted

## 2018-03-16 ENCOUNTER — Encounter: Payer: Self-pay | Admitting: Internal Medicine

## 2018-03-16 ENCOUNTER — Non-Acute Institutional Stay (SKILLED_NURSING_FACILITY): Payer: Medicare Other | Admitting: Internal Medicine

## 2018-03-16 DIAGNOSIS — E118 Type 2 diabetes mellitus with unspecified complications: Secondary | ICD-10-CM

## 2018-03-16 DIAGNOSIS — I482 Chronic atrial fibrillation, unspecified: Secondary | ICD-10-CM

## 2018-03-16 DIAGNOSIS — N289 Disorder of kidney and ureter, unspecified: Secondary | ICD-10-CM

## 2018-03-16 DIAGNOSIS — K3184 Gastroparesis: Secondary | ICD-10-CM | POA: Diagnosis not present

## 2018-03-16 DIAGNOSIS — I5022 Chronic systolic (congestive) heart failure: Secondary | ICD-10-CM | POA: Diagnosis not present

## 2018-03-16 DIAGNOSIS — I9589 Other hypotension: Secondary | ICD-10-CM | POA: Diagnosis not present

## 2018-03-16 DIAGNOSIS — K529 Noninfective gastroenteritis and colitis, unspecified: Secondary | ICD-10-CM | POA: Diagnosis not present

## 2018-03-16 DIAGNOSIS — Z794 Long term (current) use of insulin: Secondary | ICD-10-CM

## 2018-03-16 DIAGNOSIS — E861 Hypovolemia: Secondary | ICD-10-CM

## 2018-03-16 LAB — BASIC METABOLIC PANEL
Anion gap: 11 (ref 5–15)
BUN: 57 mg/dL — ABNORMAL HIGH (ref 8–23)
CO2: 30 mmol/L (ref 22–32)
Calcium: 8.9 mg/dL (ref 8.9–10.3)
Chloride: 94 mmol/L — ABNORMAL LOW (ref 98–111)
Creatinine, Ser: 3.34 mg/dL — ABNORMAL HIGH (ref 0.61–1.24)
GFR calc Af Amer: 20 mL/min — ABNORMAL LOW (ref >60)
GFR calc non Af Amer: 18 mL/min — ABNORMAL LOW (ref >60)
Glucose, Bld: 109 mg/dL — ABNORMAL HIGH (ref 70–99)
Potassium: 4.8 mmol/L (ref 3.5–5.1)
Sodium: 135 mmol/L (ref 135–145)
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
## 2018-03-17 ENCOUNTER — Non-Acute Institutional Stay (SKILLED_NURSING_FACILITY): Payer: Medicare Other | Admitting: Internal Medicine

## 2018-03-17 ENCOUNTER — Encounter (HOSPITAL_COMMUNITY)
Admission: RE | Admit: 2018-03-17 | Discharge: 2018-03-17 | Disposition: A | Discharge status: Home / Self Care | Payer: Medicare Other | Source: Skilled Nursing Facility | Attending: Internal Medicine | Admitting: Internal Medicine

## 2018-03-17 ENCOUNTER — Encounter: Payer: Self-pay | Admitting: Internal Medicine

## 2018-03-17 DIAGNOSIS — E118 Type 2 diabetes mellitus with unspecified complications: Secondary | ICD-10-CM

## 2018-03-17 DIAGNOSIS — E861 Hypovolemia: Secondary | ICD-10-CM

## 2018-03-17 DIAGNOSIS — N289 Disorder of kidney and ureter, unspecified: Secondary | ICD-10-CM

## 2018-03-17 DIAGNOSIS — I5023 Acute on chronic systolic (congestive) heart failure: Secondary | ICD-10-CM | POA: Insufficient documentation

## 2018-03-17 DIAGNOSIS — Z794 Long term (current) use of insulin: Secondary | ICD-10-CM | POA: Diagnosis not present

## 2018-03-17 DIAGNOSIS — I9589 Other hypotension: Secondary | ICD-10-CM

## 2018-03-17 DIAGNOSIS — M6289 Other specified disorders of muscle: Secondary | ICD-10-CM | POA: Insufficient documentation

## 2018-03-17 DIAGNOSIS — I110 Hypertensive heart disease with heart failure: Secondary | ICD-10-CM | POA: Insufficient documentation

## 2018-03-17 DIAGNOSIS — I5022 Chronic systolic (congestive) heart failure: Secondary | ICD-10-CM | POA: Diagnosis not present

## 2018-03-17 DIAGNOSIS — J9611 Chronic respiratory failure with hypoxia: Secondary | ICD-10-CM | POA: Insufficient documentation

## 2018-03-17 DIAGNOSIS — J690 Pneumonitis due to inhalation of food and vomit: Secondary | ICD-10-CM | POA: Insufficient documentation

## 2018-03-17 LAB — BASIC METABOLIC PANEL
Anion gap: 8 (ref 5–15)
BUN: 55 mg/dL — ABNORMAL HIGH (ref 8–23)
CO2: 32 mmol/L (ref 22–32)
Calcium: 8.7 mg/dL — ABNORMAL LOW (ref 8.9–10.3)
Chloride: 93 mmol/L — ABNORMAL LOW (ref 98–111)
Creatinine, Ser: 2.53 mg/dL — ABNORMAL HIGH (ref 0.61–1.24)
GFR calc Af Amer: 29 mL/min — ABNORMAL LOW (ref >60)
GFR calc non Af Amer: 25 mL/min — ABNORMAL LOW (ref >60)
Glucose, Bld: 85 mg/dL (ref 70–99)
Potassium: 4.5 mmol/L (ref 3.5–5.1)
Sodium: 133 mmol/L — ABNORMAL LOW (ref 135–145)

## 2018-03-17 LAB — CBC
HCT: 45.9 % (ref 39.0–52.0)
Hemoglobin: 15.3 g/dL (ref 13.0–17.0)
MCH: 31.5 pg (ref 26.0–34.0)
MCHC: 33.3 g/dL (ref 30.0–36.0)
MCV: 94.6 fL (ref 78.0–100.0)
Platelets: 146 10*3/uL — ABNORMAL LOW (ref 150–400)
RBC: 4.85 MIL/uL (ref 4.22–5.81)
RDW: 13.8 % (ref 11.5–15.5)
WBC: 5.8 10*3/uL (ref 4.0–10.5)
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
## 2018-03-19 ENCOUNTER — Non-Acute Institutional Stay (SKILLED_NURSING_FACILITY): Payer: Medicare Other | Admitting: Internal Medicine

## 2018-03-19 ENCOUNTER — Encounter (HOSPITAL_COMMUNITY)
Admission: RE | Admit: 2018-03-19 | Discharge: 2018-03-19 | Disposition: A | Discharge status: Home / Self Care | Payer: Medicare Other | Source: Skilled Nursing Facility | Attending: Internal Medicine | Admitting: Internal Medicine

## 2018-03-19 ENCOUNTER — Encounter: Payer: Self-pay | Admitting: Internal Medicine

## 2018-03-19 DIAGNOSIS — K3184 Gastroparesis: Secondary | ICD-10-CM

## 2018-03-19 DIAGNOSIS — E118 Type 2 diabetes mellitus with unspecified complications: Secondary | ICD-10-CM | POA: Diagnosis not present

## 2018-03-19 DIAGNOSIS — N289 Disorder of kidney and ureter, unspecified: Secondary | ICD-10-CM | POA: Diagnosis not present

## 2018-03-19 DIAGNOSIS — E861 Hypovolemia: Secondary | ICD-10-CM

## 2018-03-19 DIAGNOSIS — Z794 Long term (current) use of insulin: Secondary | ICD-10-CM

## 2018-03-19 DIAGNOSIS — I9589 Other hypotension: Secondary | ICD-10-CM | POA: Diagnosis not present

## 2018-03-19 DIAGNOSIS — K529 Noninfective gastroenteritis and colitis, unspecified: Secondary | ICD-10-CM | POA: Diagnosis not present

## 2018-03-19 DIAGNOSIS — I10 Essential (primary) hypertension: Secondary | ICD-10-CM | POA: Insufficient documentation

## 2018-03-19 LAB — BASIC METABOLIC PANEL
Anion gap: 9 (ref 5–15)
BUN: 33 mg/dL — ABNORMAL HIGH (ref 8–23)
CO2: 31 mmol/L (ref 22–32)
Calcium: 8.9 mg/dL (ref 8.9–10.3)
Chloride: 96 mmol/L — ABNORMAL LOW (ref 98–111)
Creatinine, Ser: 1.22 mg/dL (ref 0.61–1.24)
GFR calc Af Amer: >60 mL/min (ref >60)
GFR calc non Af Amer: 60 mL/min — ABNORMAL LOW (ref >60)
Glucose, Bld: 81 mg/dL (ref 70–99)
Potassium: 4.1 mmol/L (ref 3.5–5.1)
Sodium: 136 mmol/L (ref 135–145)
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
## 2018-03-20 ENCOUNTER — Encounter (HOSPITAL_COMMUNITY)
Admission: RE | Admit: 2018-03-20 | Discharge: 2018-03-20 | Disposition: A | Discharge status: Home / Self Care | Payer: Medicare Other | Source: Skilled Nursing Facility | Attending: Internal Medicine | Admitting: Internal Medicine

## 2018-03-20 DIAGNOSIS — I10 Essential (primary) hypertension: Secondary | ICD-10-CM | POA: Insufficient documentation

## 2018-03-20 LAB — BASIC METABOLIC PANEL
Anion gap: 9 (ref 5–15)
BUN: 24 mg/dL — ABNORMAL HIGH (ref 8–23)
CO2: 30 mmol/L (ref 22–32)
Calcium: 8.7 mg/dL — ABNORMAL LOW (ref 8.9–10.3)
Chloride: 96 mmol/L — ABNORMAL LOW (ref 98–111)
Creatinine, Ser: 1.21 mg/dL (ref 0.61–1.24)
GFR calc Af Amer: >60 mL/min (ref >60)
GFR calc non Af Amer: >60 mL/min (ref >60)
Glucose, Bld: 86 mg/dL (ref 70–99)
Potassium: 4.3 mmol/L (ref 3.5–5.1)
Sodium: 135 mmol/L (ref 135–145)

## 2018-03-23 ENCOUNTER — Encounter (HOSPITAL_COMMUNITY)
Admission: RE | Admit: 2018-03-23 | Discharge: 2018-03-23 | Disposition: A | Discharge status: Home / Self Care | Payer: Medicare Other | Source: Skilled Nursing Facility | Attending: *Deleted | Admitting: *Deleted

## 2018-03-23 ENCOUNTER — Ambulatory Visit (INDEPENDENT_AMBULATORY_CARE_PROVIDER_SITE_OTHER): Payer: Medicare Other | Admitting: Physician Assistant

## 2018-03-23 ENCOUNTER — Encounter: Payer: Self-pay | Admitting: Physician Assistant

## 2018-03-23 VITALS — BP 100/60 | HR 97 | Ht 69.0 in | Wt 239.0 lb

## 2018-03-23 DIAGNOSIS — I5022 Chronic systolic (congestive) heart failure: Secondary | ICD-10-CM

## 2018-03-23 DIAGNOSIS — Z953 Presence of xenogenic heart valve: Secondary | ICD-10-CM | POA: Diagnosis not present

## 2018-03-23 DIAGNOSIS — I428 Other cardiomyopathies: Secondary | ICD-10-CM | POA: Diagnosis not present

## 2018-03-23 DIAGNOSIS — I482 Chronic atrial fibrillation, unspecified: Secondary | ICD-10-CM

## 2018-03-23 DIAGNOSIS — E785 Hyperlipidemia, unspecified: Secondary | ICD-10-CM | POA: Diagnosis not present

## 2018-03-23 DIAGNOSIS — I952 Hypotension due to drugs: Secondary | ICD-10-CM | POA: Diagnosis not present

## 2018-03-23 DIAGNOSIS — I10 Essential (primary) hypertension: Secondary | ICD-10-CM | POA: Diagnosis not present

## 2018-03-23 LAB — BASIC METABOLIC PANEL
Anion gap: 9 (ref 5–15)
BUN: 13 mg/dL (ref 8–23)
CO2: 30 mmol/L (ref 22–32)
Calcium: 8.9 mg/dL (ref 8.9–10.3)
Chloride: 99 mmol/L (ref 98–111)
Creatinine, Ser: 1.54 mg/dL — ABNORMAL HIGH (ref 0.61–1.24)
GFR calc Af Amer: 52 mL/min — ABNORMAL LOW (ref >60)
GFR calc non Af Amer: 45 mL/min — ABNORMAL LOW (ref >60)
Glucose, Bld: 82 mg/dL (ref 70–99)
Potassium: 3.5 mmol/L (ref 3.5–5.1)
Sodium: 138 mmol/L (ref 135–145)

## 2018-03-23 NOTE — Patient Instructions (Signed)
Medication Instructions:  Your physician recommends that you continue on your current medications as directed. Please refer to the Current Medication list given to you today.   Labwork: NONE  Testing/Procedures: NONE  Follow-Up: Your physician recommends that you schedule a follow-up appointment in: 1 MONTH   Any Other Special Instructions Will Be Listed Below (If Applicable).     If you need a refill on your cardiac medications before your next appointment, please call your pharmacy.   
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
## 2018-03-26 ENCOUNTER — Encounter: Payer: Self-pay | Admitting: Internal Medicine

## 2018-03-26 ENCOUNTER — Other Ambulatory Visit: Payer: Self-pay

## 2018-03-26 ENCOUNTER — Non-Acute Institutional Stay (SKILLED_NURSING_FACILITY): Payer: Medicare Other | Admitting: Internal Medicine

## 2018-03-26 DIAGNOSIS — R110 Nausea: Secondary | ICD-10-CM | POA: Diagnosis not present

## 2018-03-26 DIAGNOSIS — I482 Chronic atrial fibrillation, unspecified: Secondary | ICD-10-CM

## 2018-03-26 DIAGNOSIS — I5022 Chronic systolic (congestive) heart failure: Secondary | ICD-10-CM

## 2018-03-26 DIAGNOSIS — I952 Hypotension due to drugs: Secondary | ICD-10-CM | POA: Diagnosis not present

## 2018-03-26 MED ORDER — OXYCODONE HCL 5 MG PO TABS: ORAL_TABLET | ORAL | 0 refills | Status: DC
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
## 2018-03-26 NOTE — Telephone Encounter (Signed)
RX Fax for [facility]@ [phone]  

## 2018-03-30 ENCOUNTER — Encounter: Payer: Self-pay | Admitting: Internal Medicine

## 2018-03-30 ENCOUNTER — Ambulatory Visit (HOSPITAL_COMMUNITY)
Admission: RE | Admit: 2018-03-30 | Discharge: 2018-03-30 | Disposition: A | Discharge status: Home / Self Care | Payer: Medicare Other | Source: Ambulatory Visit | Attending: Internal Medicine | Admitting: Internal Medicine

## 2018-03-30 ENCOUNTER — Non-Acute Institutional Stay (SKILLED_NURSING_FACILITY): Payer: Medicare Other | Admitting: Internal Medicine

## 2018-03-30 DIAGNOSIS — N200 Calculus of kidney: Secondary | ICD-10-CM | POA: Insufficient documentation

## 2018-03-30 DIAGNOSIS — K3184 Gastroparesis: Secondary | ICD-10-CM

## 2018-03-30 DIAGNOSIS — I952 Hypotension due to drugs: Secondary | ICD-10-CM

## 2018-03-30 DIAGNOSIS — I482 Chronic atrial fibrillation, unspecified: Secondary | ICD-10-CM

## 2018-03-30 DIAGNOSIS — R112 Nausea with vomiting, unspecified: Secondary | ICD-10-CM | POA: Diagnosis not present

## 2018-03-30 DIAGNOSIS — I5022 Chronic systolic (congestive) heart failure: Secondary | ICD-10-CM | POA: Diagnosis not present
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
## 2018-03-31 ENCOUNTER — Encounter (HOSPITAL_COMMUNITY)
Admission: RE | Admit: 2018-03-31 | Discharge: 2018-03-31 | Disposition: A | Discharge status: Home / Self Care | Payer: Medicare Other | Source: Skilled Nursing Facility | Attending: Internal Medicine | Admitting: Internal Medicine

## 2018-03-31 ENCOUNTER — Encounter: Payer: Self-pay | Admitting: Internal Medicine

## 2018-03-31 ENCOUNTER — Telehealth: Payer: Self-pay

## 2018-03-31 LAB — CBC
HCT: 44.6 % (ref 39.0–52.0)
Hemoglobin: 14.3 g/dL (ref 13.0–17.0)
MCH: 30.6 pg (ref 26.0–34.0)
MCHC: 32.1 g/dL (ref 30.0–36.0)
MCV: 95.5 fL (ref 78.0–100.0)
Platelets: 88 10*3/uL — ABNORMAL LOW (ref 150–400)
RBC: 4.67 MIL/uL (ref 4.22–5.81)
RDW: 15.2 % (ref 11.5–15.5)
WBC: 2 10*3/uL — ABNORMAL LOW (ref 4.0–10.5)

## 2018-03-31 LAB — COMPREHENSIVE METABOLIC PANEL
ALT: 42 U/L (ref 0–44)
AST: 48 U/L — ABNORMAL HIGH (ref 15–41)
Albumin: 3.6 g/dL (ref 3.5–5.0)
Alkaline Phosphatase: 97 U/L (ref 38–126)
Anion gap: 13 (ref 5–15)
BUN: 12 mg/dL (ref 8–23)
CO2: 26 mmol/L (ref 22–32)
Calcium: 8.6 mg/dL — ABNORMAL LOW (ref 8.9–10.3)
Chloride: 98 mmol/L (ref 98–111)
Creatinine, Ser: 1.23 mg/dL (ref 0.61–1.24)
GFR calc Af Amer: >60 mL/min (ref >60)
GFR calc non Af Amer: 59 mL/min — ABNORMAL LOW (ref >60)
Glucose, Bld: 81 mg/dL (ref 70–99)
Potassium: 3.5 mmol/L (ref 3.5–5.1)
Sodium: 137 mmol/L (ref 135–145)
Total Bilirubin: 1.4 mg/dL — ABNORMAL HIGH (ref 0.3–1.2)
Total Protein: 5.9 g/dL — ABNORMAL LOW (ref 6.5–8.1)

## 2018-03-31 LAB — AMYLASE: Amylase: 23 U/L — ABNORMAL LOW (ref 28–100)

## 2018-03-31 LAB — LIPASE, BLOOD: Lipase: 21 U/L (ref 11–51)

## 2018-03-31 NOTE — Telephone Encounter (Signed)
Patient scheduled for tomorrow, does anyone need to be called or are they aware of the appointment?

## 2018-03-31 NOTE — Telephone Encounter (Signed)
Stacey please schedule the patient to see Tobi Bastos tomorrow at 8:30

## 2018-03-31 NOTE — Telephone Encounter (Signed)
Facility aware and will bring him

## 2018-03-31 NOTE — Telephone Encounter (Signed)
Please call the facility, they are not aware of the appt

## 2018-03-31 NOTE — Telephone Encounter (Signed)
Received a call from [place] from [place]. Dr. Chales Abrahams has been trying to get an appointment for this pt x 2 weeks. Pts Ct scan shows a mass in the pancrease, gallbladder issues ect. A fax was sent to Dr. Urban Gibson today with an appointment date of 06/24/18. They want pt seen sooner than the appointment given. Pt will be discharged today and they would like our office to call [facility] with an earlier appointment date and time. Please advise.

## 2018-04-01 ENCOUNTER — Encounter: Payer: Self-pay | Admitting: Gastroenterology

## 2018-04-01 ENCOUNTER — Ambulatory Visit (INDEPENDENT_AMBULATORY_CARE_PROVIDER_SITE_OTHER): Payer: Medicare Other | Admitting: Gastroenterology

## 2018-04-01 ENCOUNTER — Encounter: Payer: Self-pay | Admitting: *Deleted

## 2018-04-01 VITALS — BP 148/60 | HR 76 | Temp 96.7°F | Ht 69.0 in | Wt 239.4 lb

## 2018-04-01 DIAGNOSIS — K3184 Gastroparesis: Secondary | ICD-10-CM | POA: Diagnosis not present

## 2018-04-01 DIAGNOSIS — K869 Disease of pancreas, unspecified: Secondary | ICD-10-CM
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
## 2018-04-01 NOTE — Patient Instructions (Signed)
Continue Protonix once daily. Continue Reglan for now. We may be able to taper this down soon!  I have ordered an MRI to further evaluate your pancreas.  I will see you back in 6 weeks for follow-up and discuss screening colonoscopy.  It was a pleasure to see you today. I strive to create trusting relationships with patients to provide genuine, compassionate, and quality care. I value your feedback. If you receive a survey regarding your visit,  I greatly appreciate you taking time to fill this out.   Gelene Mink, PhD, ANP-BC [facility]

## 2018-04-02 DIAGNOSIS — S91302D Unspecified open wound, left foot, subsequent encounter: Secondary | ICD-10-CM | POA: Diagnosis not present

## 2018-04-02 DIAGNOSIS — I482 Chronic atrial fibrillation: Secondary | ICD-10-CM | POA: Diagnosis not present

## 2018-04-02 DIAGNOSIS — I5023 Acute on chronic systolic (congestive) heart failure: Secondary | ICD-10-CM | POA: Diagnosis not present

## 2018-04-02 DIAGNOSIS — I110 Hypertensive heart disease with heart failure: Secondary | ICD-10-CM | POA: Diagnosis not present

## 2018-04-02 DIAGNOSIS — Z794 Long term (current) use of insulin: Secondary | ICD-10-CM | POA: Diagnosis not present

## 2018-04-02 DIAGNOSIS — E1143 Type 2 diabetes mellitus with diabetic autonomic (poly)neuropathy: Secondary | ICD-10-CM | POA: Diagnosis not present

## 2018-04-03 DIAGNOSIS — E1143 Type 2 diabetes mellitus with diabetic autonomic (poly)neuropathy: Secondary | ICD-10-CM | POA: Diagnosis not present

## 2018-04-03 DIAGNOSIS — S91302D Unspecified open wound, left foot, subsequent encounter: Secondary | ICD-10-CM | POA: Diagnosis not present

## 2018-04-03 DIAGNOSIS — Z794 Long term (current) use of insulin: Secondary | ICD-10-CM | POA: Diagnosis not present

## 2018-04-03 DIAGNOSIS — I5023 Acute on chronic systolic (congestive) heart failure: Secondary | ICD-10-CM | POA: Diagnosis not present

## 2018-04-03 DIAGNOSIS — I110 Hypertensive heart disease with heart failure: Secondary | ICD-10-CM | POA: Diagnosis not present

## 2018-04-03 DIAGNOSIS — I482 Chronic atrial fibrillation: Secondary | ICD-10-CM | POA: Diagnosis not present

## 2018-04-06 ENCOUNTER — Telehealth: Payer: Self-pay | Admitting: Student

## 2018-04-06 ENCOUNTER — Encounter: Payer: Self-pay | Admitting: Gastroenterology

## 2018-04-06 DIAGNOSIS — I110 Hypertensive heart disease with heart failure: Secondary | ICD-10-CM | POA: Diagnosis not present

## 2018-04-06 DIAGNOSIS — E1143 Type 2 diabetes mellitus with diabetic autonomic (poly)neuropathy: Secondary | ICD-10-CM | POA: Diagnosis not present

## 2018-04-06 DIAGNOSIS — I5023 Acute on chronic systolic (congestive) heart failure: Secondary | ICD-10-CM | POA: Diagnosis not present

## 2018-04-06 DIAGNOSIS — I482 Chronic atrial fibrillation: Secondary | ICD-10-CM | POA: Diagnosis not present

## 2018-04-06 DIAGNOSIS — S91302D Unspecified open wound, left foot, subsequent encounter: Secondary | ICD-10-CM | POA: Diagnosis not present

## 2018-04-06 DIAGNOSIS — Z794 Long term (current) use of insulin: Secondary | ICD-10-CM | POA: Diagnosis not present

## 2018-04-06 NOTE — Assessment & Plan Note (Signed)
Noted on CT. MRI pancreas for more dedicated imaging ordered.

## 2018-04-06 NOTE — Telephone Encounter (Signed)
Per M.Lenze PA-C, patient should be weighed daily, we need to be contacted for weight gain over 3 lbs in 24 hrs or 5 lbs in one week.   Pt weight today is 255 lbs, BP 130/70, HR 80

## 2018-04-06 NOTE — Telephone Encounter (Signed)
Please call Marisue Ivan w/ [facility] regarding patient's fluid medication and order for daily weights. / tg

## 2018-04-06 NOTE — Assessment & Plan Note (Signed)
67 year old male referred due to N/V that has improved with PPI therapy and empiric Reglan TID. Query underlying gastroparesis but no formal assessment via GES, compounding GERD symptoms. Will monitor closely and pursue EGD if any further symptoms. Return for close follow-up in 6 weeks to arrange screening colonoscopy and possible EGD if needed. Continue PPI daily. As of note, he feels significantly better now that he is back at [facility], affectionately stating "he is home". Return in 6 weeks.

## 2018-04-07 ENCOUNTER — Ambulatory Visit (HOSPITAL_COMMUNITY)
Admission: RE | Admit: 2018-04-07 | Discharge: 2018-04-07 | Disposition: A | Discharge status: Home / Self Care | Payer: Medicare Other | Source: Ambulatory Visit | Attending: Gastroenterology | Admitting: Gastroenterology

## 2018-04-07 ENCOUNTER — Other Ambulatory Visit: Payer: Self-pay | Admitting: Gastroenterology

## 2018-04-07 DIAGNOSIS — R935 Abnormal findings on diagnostic imaging of other abdominal regions, including retroperitoneum: Secondary | ICD-10-CM | POA: Diagnosis not present

## 2018-04-07 DIAGNOSIS — K869 Disease of pancreas, unspecified: Secondary | ICD-10-CM

## 2018-04-07 DIAGNOSIS — K8689 Other specified diseases of pancreas: Secondary | ICD-10-CM | POA: Diagnosis not present

## 2018-04-07 DIAGNOSIS — J90 Pleural effusion, not elsewhere classified: Secondary | ICD-10-CM | POA: Diagnosis not present

## 2018-04-07 MED ORDER — GADOBUTROL 1 MMOL/ML IV SOLN
10.0000 mL | Freq: Once | INTRAVENOUS | Status: AC | PRN
  Administered 2018-04-07: 10 mL via INTRAVENOUS

## 2018-04-07 NOTE — Progress Notes (Signed)
CC'D TO PCP °

## 2018-04-07 NOTE — Progress Notes (Signed)
MRI with two tiny cystic lesions in pancreatic head and uncinate process, suspicious for indolent neoplasms but no high risk features. We need to repeat MRI in 2 years. Ampullary mass unable to be excluded. He needs direct visualization via EGD. Would be good to do routine colonoscopy at same time. He is returning in several weeks to arrange colonoscopy and we can arrange this then. We will need to see if we can hold his Xarelto for 48 hours prior to procedures: please review with cardiology.

## 2018-04-08 DIAGNOSIS — I482 Chronic atrial fibrillation: Secondary | ICD-10-CM | POA: Diagnosis not present

## 2018-04-08 DIAGNOSIS — S91302D Unspecified open wound, left foot, subsequent encounter: Secondary | ICD-10-CM | POA: Diagnosis not present

## 2018-04-08 DIAGNOSIS — E1143 Type 2 diabetes mellitus with diabetic autonomic (poly)neuropathy: Secondary | ICD-10-CM | POA: Diagnosis not present

## 2018-04-08 DIAGNOSIS — Z794 Long term (current) use of insulin: Secondary | ICD-10-CM | POA: Diagnosis not present

## 2018-04-08 DIAGNOSIS — I110 Hypertensive heart disease with heart failure: Secondary | ICD-10-CM | POA: Diagnosis not present

## 2018-04-08 DIAGNOSIS — I5023 Acute on chronic systolic (congestive) heart failure: Secondary | ICD-10-CM | POA: Diagnosis not present

## 2018-04-09 DIAGNOSIS — I5023 Acute on chronic systolic (congestive) heart failure: Secondary | ICD-10-CM | POA: Diagnosis not present

## 2018-04-09 DIAGNOSIS — S91302D Unspecified open wound, left foot, subsequent encounter: Secondary | ICD-10-CM | POA: Diagnosis not present

## 2018-04-09 DIAGNOSIS — E1143 Type 2 diabetes mellitus with diabetic autonomic (poly)neuropathy: Secondary | ICD-10-CM | POA: Diagnosis not present

## 2018-04-09 DIAGNOSIS — Z794 Long term (current) use of insulin: Secondary | ICD-10-CM | POA: Diagnosis not present

## 2018-04-09 DIAGNOSIS — I110 Hypertensive heart disease with heart failure: Secondary | ICD-10-CM | POA: Diagnosis not present

## 2018-04-09 DIAGNOSIS — I482 Chronic atrial fibrillation: Secondary | ICD-10-CM | POA: Diagnosis not present

## 2018-04-11 ENCOUNTER — Encounter (HOSPITAL_COMMUNITY): Payer: Self-pay | Admitting: Emergency Medicine

## 2018-04-11 ENCOUNTER — Emergency Department (HOSPITAL_COMMUNITY): Payer: Medicare Other

## 2018-04-11 ENCOUNTER — Other Ambulatory Visit: Payer: Self-pay

## 2018-04-11 ENCOUNTER — Emergency Department (HOSPITAL_COMMUNITY)
Admission: EM | Admit: 2018-04-11 | Discharge: 2018-04-11 | Disposition: A | Discharge status: Home / Self Care | Payer: Medicare Other | Attending: Emergency Medicine | Admitting: Emergency Medicine

## 2018-04-11 DIAGNOSIS — Z79899 Other long term (current) drug therapy: Secondary | ICD-10-CM | POA: Insufficient documentation

## 2018-04-11 DIAGNOSIS — I5022 Chronic systolic (congestive) heart failure: Secondary | ICD-10-CM | POA: Diagnosis not present

## 2018-04-11 DIAGNOSIS — R109 Unspecified abdominal pain: Secondary | ICD-10-CM | POA: Diagnosis not present

## 2018-04-11 DIAGNOSIS — R1084 Generalized abdominal pain: Secondary | ICD-10-CM | POA: Diagnosis not present

## 2018-04-11 DIAGNOSIS — I110 Hypertensive heart disease with heart failure: Secondary | ICD-10-CM | POA: Insufficient documentation

## 2018-04-11 DIAGNOSIS — J449 Chronic obstructive pulmonary disease, unspecified: Secondary | ICD-10-CM | POA: Insufficient documentation

## 2018-04-11 DIAGNOSIS — N200 Calculus of kidney: Secondary | ICD-10-CM | POA: Diagnosis not present

## 2018-04-11 DIAGNOSIS — Z952 Presence of prosthetic heart valve: Secondary | ICD-10-CM | POA: Insufficient documentation

## 2018-04-11 DIAGNOSIS — Z87891 Personal history of nicotine dependence: Secondary | ICD-10-CM | POA: Diagnosis not present

## 2018-04-11 DIAGNOSIS — Z96641 Presence of right artificial hip joint: Secondary | ICD-10-CM | POA: Diagnosis not present

## 2018-04-11 DIAGNOSIS — R142 Eructation: Secondary | ICD-10-CM | POA: Diagnosis not present

## 2018-04-11 DIAGNOSIS — Z794 Long term (current) use of insulin: Secondary | ICD-10-CM | POA: Diagnosis not present

## 2018-04-11 DIAGNOSIS — R110 Nausea: Secondary | ICD-10-CM | POA: Diagnosis not present

## 2018-04-11 DIAGNOSIS — K30 Functional dyspepsia: Secondary | ICD-10-CM | POA: Insufficient documentation

## 2018-04-11 DIAGNOSIS — E119 Type 2 diabetes mellitus without complications: Secondary | ICD-10-CM | POA: Insufficient documentation

## 2018-04-11 DIAGNOSIS — Z7901 Long term (current) use of anticoagulants: Secondary | ICD-10-CM | POA: Insufficient documentation

## 2018-04-11 LAB — URINALYSIS, ROUTINE W REFLEX MICROSCOPIC
Bacteria, UA: NONE SEEN
Bilirubin Urine: NEGATIVE
Glucose, UA: >=500 mg/dL — AB
Hgb urine dipstick: NEGATIVE
Ketones, ur: 20 mg/dL — AB
Leukocytes, UA: NEGATIVE
Nitrite: NEGATIVE
Protein, ur: NEGATIVE mg/dL
Specific Gravity, Urine: 1.043 — ABNORMAL HIGH (ref 1.005–1.030)
pH: 5 (ref 5.0–8.0)

## 2018-04-11 LAB — CBC WITH DIFFERENTIAL/PLATELET
Basophils Absolute: 0 10*3/uL (ref 0.0–0.1)
Basophils Relative: 0 %
Eosinophils Absolute: 0 10*3/uL (ref 0.0–0.7)
Eosinophils Relative: 0 %
HCT: 42.3 % (ref 39.0–52.0)
Hemoglobin: 13.8 g/dL (ref 13.0–17.0)
Lymphocytes Relative: 11 %
Lymphs Abs: 0.4 10*3/uL — ABNORMAL LOW (ref 0.7–4.0)
MCH: 31.3 pg (ref 26.0–34.0)
MCHC: 32.6 g/dL (ref 30.0–36.0)
MCV: 95.9 fL (ref 78.0–100.0)
Monocytes Absolute: 0.3 10*3/uL (ref 0.1–1.0)
Monocytes Relative: 8 %
Neutro Abs: 2.6 10*3/uL (ref 1.7–7.7)
Neutrophils Relative %: 81 %
Platelets: 147 10*3/uL — ABNORMAL LOW (ref 150–400)
RBC: 4.41 MIL/uL (ref 4.22–5.81)
RDW: 16.1 % — ABNORMAL HIGH (ref 11.5–15.5)
WBC: 3.2 10*3/uL — ABNORMAL LOW (ref 4.0–10.5)

## 2018-04-11 LAB — COMPREHENSIVE METABOLIC PANEL
ALT: 24 U/L (ref 0–44)
AST: 30 U/L (ref 15–41)
Albumin: 2.8 g/dL — ABNORMAL LOW (ref 3.5–5.0)
Alkaline Phosphatase: 91 U/L (ref 38–126)
Anion gap: 9 (ref 5–15)
BUN: 11 mg/dL (ref 8–23)
CO2: 29 mmol/L (ref 22–32)
Calcium: 8.6 mg/dL — ABNORMAL LOW (ref 8.9–10.3)
Chloride: 104 mmol/L (ref 98–111)
Creatinine, Ser: 0.95 mg/dL (ref 0.61–1.24)
GFR calc Af Amer: >60 mL/min (ref >60)
GFR calc non Af Amer: >60 mL/min (ref >60)
Glucose, Bld: 104 mg/dL — ABNORMAL HIGH (ref 70–99)
Potassium: 4.5 mmol/L (ref 3.5–5.1)
Sodium: 142 mmol/L (ref 135–145)
Total Bilirubin: 1.2 mg/dL (ref 0.3–1.2)
Total Protein: 5.5 g/dL — ABNORMAL LOW (ref 6.5–8.1)

## 2018-04-11 LAB — LIPASE, BLOOD: Lipase: 16 U/L (ref 11–51)

## 2018-04-11 MED ORDER — SODIUM CHLORIDE 0.9 % IV BOLUS (SEPSIS)
500.0000 mL | Freq: Once | INTRAVENOUS | Status: AC
  Administered 2018-04-11: 500 mL via INTRAVENOUS

## 2018-04-11 MED ORDER — POLYETHYLENE GLYCOL 3350 17 G PO PACK: 17.0000 g | PACK | Freq: Every day | ORAL | 0 refills | Status: DC

## 2018-04-11 MED ORDER — METOCLOPRAMIDE HCL 5 MG PO TABS: 5.0000 mg | ORAL_TABLET | Freq: Four times a day (QID) | ORAL | 0 refills | Status: DC

## 2018-04-11 MED ORDER — METOCLOPRAMIDE HCL 10 MG PO TABS
10.0000 mg | ORAL_TABLET | Freq: Once | ORAL | Status: AC
  Administered 2018-04-11: 10 mg via ORAL
  Filled 2018-04-11: qty 1

## 2018-04-11 MED ORDER — IOPAMIDOL (ISOVUE-300) INJECTION 61%
100.0000 mL | Freq: Once | INTRAVENOUS | Status: AC | PRN
  Administered 2018-04-11: 100 mL via INTRAVENOUS

## 2018-04-11 MED ORDER — SODIUM CHLORIDE 0.9 % IV SOLN
1000.0000 mL | INTRAVENOUS | Status: DC
  Administered 2018-04-11: 1000 mL via INTRAVENOUS

## 2018-04-11 NOTE — ED Notes (Signed)
Awaiting transport from [place]  

## 2018-04-11 NOTE — Discharge Instructions (Addendum)
Your vital signs are within normal limits.  Your CT scan reveals some fluid in the sac around your lung area called a pleural effusion.  Please have your doctor follow this closely.  No new or significant abnormality about the abdomen.  Please use a bland diet if possible over the next 4 or 5 days.  Use your Reglan and your Protonix as ordered.  Please return to the emergency department if any changes in your condition, problems, or concerns.

## 2018-04-11 NOTE — ED Triage Notes (Signed)
Pt brought in from [facility] for evaluation of indigestion and nausea.
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
## 2018-04-12 ENCOUNTER — Emergency Department (HOSPITAL_COMMUNITY): Payer: Medicare Other

## 2018-04-12 ENCOUNTER — Emergency Department (HOSPITAL_COMMUNITY)
Admission: EM | Admit: 2018-04-12 | Discharge: 2018-04-13 | Disposition: A | Discharge status: Other Acute Inpatient Hospital | Payer: Medicare Other | Attending: Emergency Medicine | Admitting: Emergency Medicine

## 2018-04-12 ENCOUNTER — Other Ambulatory Visit: Payer: Self-pay

## 2018-04-12 ENCOUNTER — Encounter (HOSPITAL_COMMUNITY): Payer: Self-pay

## 2018-04-12 DIAGNOSIS — I10 Essential (primary) hypertension: Secondary | ICD-10-CM | POA: Diagnosis not present

## 2018-04-12 DIAGNOSIS — E1143 Type 2 diabetes mellitus with diabetic autonomic (poly)neuropathy: Secondary | ICD-10-CM | POA: Insufficient documentation

## 2018-04-12 DIAGNOSIS — R1084 Generalized abdominal pain: Secondary | ICD-10-CM | POA: Diagnosis not present

## 2018-04-12 DIAGNOSIS — R05 Cough: Secondary | ICD-10-CM | POA: Diagnosis not present

## 2018-04-12 DIAGNOSIS — R112 Nausea with vomiting, unspecified: Secondary | ICD-10-CM | POA: Insufficient documentation

## 2018-04-12 DIAGNOSIS — J449 Chronic obstructive pulmonary disease, unspecified: Secondary | ICD-10-CM | POA: Insufficient documentation

## 2018-04-12 DIAGNOSIS — Z87891 Personal history of nicotine dependence: Secondary | ICD-10-CM | POA: Diagnosis not present

## 2018-04-12 DIAGNOSIS — I110 Hypertensive heart disease with heart failure: Secondary | ICD-10-CM | POA: Insufficient documentation

## 2018-04-12 DIAGNOSIS — R569 Unspecified convulsions: Secondary | ICD-10-CM | POA: Diagnosis not present

## 2018-04-12 DIAGNOSIS — R0902 Hypoxemia: Secondary | ICD-10-CM | POA: Diagnosis not present

## 2018-04-12 DIAGNOSIS — I5022 Chronic systolic (congestive) heart failure: Secondary | ICD-10-CM | POA: Diagnosis not present

## 2018-04-12 DIAGNOSIS — Z7901 Long term (current) use of anticoagulants: Secondary | ICD-10-CM | POA: Diagnosis not present

## 2018-04-12 DIAGNOSIS — Z79899 Other long term (current) drug therapy: Secondary | ICD-10-CM | POA: Insufficient documentation

## 2018-04-12 DIAGNOSIS — Z794 Long term (current) use of insulin: Secondary | ICD-10-CM | POA: Diagnosis not present

## 2018-04-12 DIAGNOSIS — R109 Unspecified abdominal pain: Secondary | ICD-10-CM | POA: Diagnosis not present

## 2018-04-12 DIAGNOSIS — I4891 Unspecified atrial fibrillation: Secondary | ICD-10-CM | POA: Diagnosis not present

## 2018-04-12 DIAGNOSIS — R52 Pain, unspecified: Secondary | ICD-10-CM | POA: Diagnosis not present

## 2018-04-12 HISTORY — DX: Gastroparesis: K31.84

## 2018-04-12 LAB — I-STAT TROPONIN, ED: Troponin i, poc: 0.03 ng/mL (ref 0.00–0.08)

## 2018-04-12 MED ORDER — DICYCLOMINE HCL 10 MG PO CAPS
10.0000 mg | ORAL_CAPSULE | Freq: Once | ORAL | Status: AC
  Administered 2018-04-12: 10 mg via ORAL
  Filled 2018-04-12: qty 1

## 2018-04-12 MED ORDER — ONDANSETRON HCL 4 MG/2ML IJ SOLN
4.0000 mg | Freq: Once | INTRAMUSCULAR | Status: AC
  Administered 2018-04-12: 4 mg via INTRAVENOUS
  Filled 2018-04-12: qty 2

## 2018-04-12 MED ORDER — FAMOTIDINE IN NACL 20-0.9 MG/50ML-% IV SOLN
20.0000 mg | Freq: Two times a day (BID) | INTRAVENOUS | Status: DC
  Administered 2018-04-12: 20 mg via INTRAVENOUS
  Filled 2018-04-12: qty 50

## 2018-04-12 MED ORDER — METOCLOPRAMIDE HCL 5 MG/ML IJ SOLN
5.0000 mg | Freq: Once | INTRAMUSCULAR | Status: AC
  Administered 2018-04-12: 5 mg via INTRAVENOUS
  Filled 2018-04-12: qty 2

## 2018-04-12 MED ORDER — METOCLOPRAMIDE HCL 10 MG PO TABS
5.0000 mg | ORAL_TABLET | Freq: Once | ORAL | Status: AC
  Administered 2018-04-12: 5 mg via ORAL
  Filled 2018-04-12: qty 1
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
## 2018-04-12 NOTE — Discharge Instructions (Addendum)
You need to follow-up with your regular doctor later this week to have your blood pressure rechecked as it was high in the emergency department today.  Please follow-up with your gastroenterology doctor in regards to persistent nausea and vomiting.    Please return to the ER sooner if you have any new or worsening symptoms, or if you have any of the following symptoms:  Chest pain, shortness of breath, headaches, numbness, weakness Abdominal pain that does not go away.  You have a fever.  You keep throwing up (vomiting).  The pain is felt only in portions of the abdomen. Pain in the right side could possibly be appendicitis. In an adult, pain in the left lower portion of the abdomen could be colitis or diverticulitis.  You pass bloody or black tarry stools.  There is bright red blood in the stool.  The constipation stays for more than 4 days.  There is belly (abdominal) or rectal pain.  You do not seem to be getting better.  You have any questions or concerns.

## 2018-04-12 NOTE — ED Triage Notes (Signed)
Pt here from [place] with abd pain, gas, nausea. Seen here yesterday for the same. Wasn't able to get reglan filled so he came back tonight via ems

## 2018-04-13 ENCOUNTER — Telehealth: Payer: Self-pay | Admitting: Cardiovascular Disease

## 2018-04-13 NOTE — Telephone Encounter (Signed)
° °  ° °  [facility] Pre-operative Risk Assessment    Request for surgical clearance:  1. What type of surgery is being performed? Upper endo and colonoscopy  2. When is this surgery scheduled? TBD  3. What type of clearance is required (medical clearance vs. Pharmacy clearance to hold med vs. Both)? both  4. Are there any medications that need to be held prior to surgery and how long? Xarelto  5. Practice name and name of physician performing surgery? Dr Sydell Axon, [facility]   What is your office phone number [phone]  7.   What is your office fax number [phone]  8.   Anesthesia type (None, local, MAC, general) ? [place] 04/13/2018, 3:34 PM  _________________________________________________________________   (provider comments below)

## 2018-04-14 DIAGNOSIS — Z794 Long term (current) use of insulin: Secondary | ICD-10-CM | POA: Diagnosis not present

## 2018-04-14 DIAGNOSIS — I482 Chronic atrial fibrillation: Secondary | ICD-10-CM | POA: Diagnosis not present

## 2018-04-14 DIAGNOSIS — I110 Hypertensive heart disease with heart failure: Secondary | ICD-10-CM | POA: Diagnosis not present

## 2018-04-14 DIAGNOSIS — E1143 Type 2 diabetes mellitus with diabetic autonomic (poly)neuropathy: Secondary | ICD-10-CM | POA: Diagnosis not present

## 2018-04-14 DIAGNOSIS — S91302D Unspecified open wound, left foot, subsequent encounter: Secondary | ICD-10-CM | POA: Diagnosis not present

## 2018-04-14 DIAGNOSIS — I5023 Acute on chronic systolic (congestive) heart failure: Secondary | ICD-10-CM | POA: Diagnosis not present

## 2018-04-14 NOTE — Telephone Encounter (Signed)
Pt has appt 04/28/18, was going to see if he could wait for appt.  I called but he was not available.  Needs call back,

## 2018-04-15 DIAGNOSIS — Z794 Long term (current) use of insulin: Secondary | ICD-10-CM | POA: Diagnosis not present

## 2018-04-15 DIAGNOSIS — I5023 Acute on chronic systolic (congestive) heart failure: Secondary | ICD-10-CM | POA: Diagnosis not present

## 2018-04-15 DIAGNOSIS — I482 Chronic atrial fibrillation: Secondary | ICD-10-CM | POA: Diagnosis not present

## 2018-04-15 DIAGNOSIS — E1143 Type 2 diabetes mellitus with diabetic autonomic (poly)neuropathy: Secondary | ICD-10-CM | POA: Diagnosis not present

## 2018-04-15 DIAGNOSIS — I110 Hypertensive heart disease with heart failure: Secondary | ICD-10-CM | POA: Diagnosis not present

## 2018-04-15 DIAGNOSIS — S91302D Unspecified open wound, left foot, subsequent encounter: Secondary | ICD-10-CM | POA: Diagnosis not present

## 2018-04-16 DIAGNOSIS — I482 Chronic atrial fibrillation: Secondary | ICD-10-CM | POA: Diagnosis not present

## 2018-04-16 DIAGNOSIS — I110 Hypertensive heart disease with heart failure: Secondary | ICD-10-CM | POA: Diagnosis not present

## 2018-04-16 DIAGNOSIS — S91302D Unspecified open wound, left foot, subsequent encounter: Secondary | ICD-10-CM | POA: Diagnosis not present

## 2018-04-16 DIAGNOSIS — Z794 Long term (current) use of insulin: Secondary | ICD-10-CM | POA: Diagnosis not present

## 2018-04-16 DIAGNOSIS — E1143 Type 2 diabetes mellitus with diabetic autonomic (poly)neuropathy: Secondary | ICD-10-CM | POA: Diagnosis not present

## 2018-04-16 DIAGNOSIS — I5023 Acute on chronic systolic (congestive) heart failure: Secondary | ICD-10-CM | POA: Diagnosis not present

## 2018-04-16 NOTE — Telephone Encounter (Signed)
Pharm please address [email or web address] thanks 

## 2018-04-16 NOTE — Telephone Encounter (Signed)
[place], pt was planning to discuss with you on OV 04/28/18 - I will send to pharm so the info will be present, but if you could send clearance to Dr. Kendell Bane if you agee. Thanks.

## 2018-04-17 ENCOUNTER — Other Ambulatory Visit: Payer: Self-pay

## 2018-04-17 ENCOUNTER — Emergency Department (HOSPITAL_COMMUNITY): Payer: Medicare Other

## 2018-04-17 ENCOUNTER — Emergency Department (HOSPITAL_COMMUNITY)
Admission: EM | Admit: 2018-04-17 | Discharge: 2018-04-17 | Disposition: A | Discharge status: Home / Self Care | Payer: Medicare Other | Attending: Emergency Medicine | Admitting: Emergency Medicine

## 2018-04-17 ENCOUNTER — Encounter (HOSPITAL_COMMUNITY): Payer: Self-pay | Admitting: Emergency Medicine

## 2018-04-17 DIAGNOSIS — I5022 Chronic systolic (congestive) heart failure: Secondary | ICD-10-CM | POA: Diagnosis not present

## 2018-04-17 DIAGNOSIS — I110 Hypertensive heart disease with heart failure: Secondary | ICD-10-CM | POA: Diagnosis not present

## 2018-04-17 DIAGNOSIS — I5023 Acute on chronic systolic (congestive) heart failure: Secondary | ICD-10-CM

## 2018-04-17 DIAGNOSIS — R2241 Localized swelling, mass and lump, right lower limb: Secondary | ICD-10-CM | POA: Diagnosis present

## 2018-04-17 DIAGNOSIS — E119 Type 2 diabetes mellitus without complications: Secondary | ICD-10-CM | POA: Insufficient documentation

## 2018-04-17 DIAGNOSIS — Z96641 Presence of right artificial hip joint: Secondary | ICD-10-CM | POA: Insufficient documentation

## 2018-04-17 DIAGNOSIS — J449 Chronic obstructive pulmonary disease, unspecified: Secondary | ICD-10-CM | POA: Diagnosis not present

## 2018-04-17 DIAGNOSIS — Z794 Long term (current) use of insulin: Secondary | ICD-10-CM | POA: Diagnosis not present

## 2018-04-17 DIAGNOSIS — Z87891 Personal history of nicotine dependence: Secondary | ICD-10-CM | POA: Diagnosis not present

## 2018-04-17 DIAGNOSIS — Z79899 Other long term (current) drug therapy: Secondary | ICD-10-CM | POA: Insufficient documentation

## 2018-04-17 DIAGNOSIS — R05 Cough: Secondary | ICD-10-CM | POA: Diagnosis not present

## 2018-04-17 LAB — CBC WITH DIFFERENTIAL/PLATELET
Abs Immature Granulocytes: 0.01 10*3/uL (ref 0.00–0.07)
Basophils Absolute: 0 10*3/uL (ref 0.0–0.1)
Basophils Relative: 1 %
Eosinophils Absolute: 0 10*3/uL (ref 0.0–0.5)
Eosinophils Relative: 1 %
HCT: 44.3 % (ref 39.0–52.0)
Hemoglobin: 13.9 g/dL (ref 13.0–17.0)
Immature Granulocytes: 0 %
Lymphocytes Relative: 13 %
Lymphs Abs: 0.6 10*3/uL — ABNORMAL LOW (ref 0.7–4.0)
MCH: 30.7 pg (ref 26.0–34.0)
MCHC: 31.4 g/dL (ref 30.0–36.0)
MCV: 97.8 fL (ref 80.0–100.0)
Monocytes Absolute: 0.4 10*3/uL (ref 0.1–1.0)
Monocytes Relative: 9 %
Neutro Abs: 3.4 10*3/uL (ref 1.7–7.7)
Neutrophils Relative %: 76 %
Platelets: 190 10*3/uL (ref 150–400)
RBC: 4.53 MIL/uL (ref 4.22–5.81)
RDW: 16.3 % — ABNORMAL HIGH (ref 11.5–15.5)
WBC: 4.4 10*3/uL (ref 4.0–10.5)
nRBC: 0 % (ref 0.0–0.2)

## 2018-04-17 LAB — BASIC METABOLIC PANEL
Anion gap: 8 (ref 5–15)
BUN: 6 mg/dL — ABNORMAL LOW (ref 8–23)
CO2: 29 mmol/L (ref 22–32)
Calcium: 8.6 mg/dL — ABNORMAL LOW (ref 8.9–10.3)
Chloride: 105 mmol/L (ref 98–111)
Creatinine, Ser: 0.85 mg/dL (ref 0.61–1.24)
GFR calc Af Amer: >60 mL/min (ref >60)
GFR calc non Af Amer: >60 mL/min (ref >60)
Glucose, Bld: 78 mg/dL (ref 70–99)
Potassium: 4.1 mmol/L (ref 3.5–5.1)
Sodium: 142 mmol/L (ref 135–145)

## 2018-04-17 LAB — BRAIN NATRIURETIC PEPTIDE: B Natriuretic Peptide: 481 pg/mL — ABNORMAL HIGH (ref 0.0–100.0)

## 2018-04-17 MED ORDER — FUROSEMIDE 20 MG PO TABS: 20.0000 mg | ORAL_TABLET | Freq: Every day | ORAL | 0 refills | Status: DC

## 2018-04-17 MED ORDER — FUROSEMIDE 10 MG/ML IJ SOLN
40.0000 mg | Freq: Once | INTRAMUSCULAR | Status: AC
  Administered 2018-04-17: 40 mg via INTRAVENOUS
  Filled 2018-04-17: qty 4

## 2018-04-17 NOTE — Telephone Encounter (Signed)
Patient with diagnosis of Afib on Xarelto for anticoagulation.    Procedure: upper endo and colonoscopy Date of procedure: TBD  CHADS2-VASc score of  5 (CHF, HTN, AGE, DM2, stroke/tia x 2, CAD, AGE, male)  CrCl 186ml/min  Per office protocol, patient can hold Xarelto for 24 hours prior to procedure.
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
## 2018-04-17 NOTE — ED Triage Notes (Signed)
Patient complaining of swelling to bilateral lower legs x 2-3 weeks. Also states "I don't want to eat because I have that gastroparesis and I'm scared it will mess up my stomach." Denies vomiting or nausea. Denies pain.

## 2018-04-17 NOTE — Discharge Instructions (Signed)
Keep your scheduled appoint with Dr. Yetta Barre on 04/27/2018

## 2018-04-17 NOTE — ED Notes (Signed)
Pt to xray

## 2018-04-20 ENCOUNTER — Other Ambulatory Visit: Payer: Self-pay | Admitting: Orthopaedic Surgery

## 2018-04-20 DIAGNOSIS — E1143 Type 2 diabetes mellitus with diabetic autonomic (poly)neuropathy: Secondary | ICD-10-CM | POA: Diagnosis not present

## 2018-04-20 DIAGNOSIS — I110 Hypertensive heart disease with heart failure: Secondary | ICD-10-CM | POA: Diagnosis not present

## 2018-04-20 DIAGNOSIS — Z794 Long term (current) use of insulin: Secondary | ICD-10-CM | POA: Diagnosis not present

## 2018-04-20 DIAGNOSIS — S91302D Unspecified open wound, left foot, subsequent encounter: Secondary | ICD-10-CM | POA: Diagnosis not present

## 2018-04-20 DIAGNOSIS — I5023 Acute on chronic systolic (congestive) heart failure: Secondary | ICD-10-CM | POA: Diagnosis not present

## 2018-04-20 DIAGNOSIS — I482 Chronic atrial fibrillation: Secondary | ICD-10-CM | POA: Diagnosis not present

## 2018-04-21 DIAGNOSIS — S91302D Unspecified open wound, left foot, subsequent encounter: Secondary | ICD-10-CM | POA: Diagnosis not present

## 2018-04-21 DIAGNOSIS — E1143 Type 2 diabetes mellitus with diabetic autonomic (poly)neuropathy: Secondary | ICD-10-CM | POA: Diagnosis not present

## 2018-04-21 DIAGNOSIS — Z794 Long term (current) use of insulin: Secondary | ICD-10-CM | POA: Diagnosis not present

## 2018-04-21 DIAGNOSIS — I482 Chronic atrial fibrillation: Secondary | ICD-10-CM | POA: Diagnosis not present

## 2018-04-21 DIAGNOSIS — I5023 Acute on chronic systolic (congestive) heart failure: Secondary | ICD-10-CM | POA: Diagnosis not present

## 2018-04-21 DIAGNOSIS — I110 Hypertensive heart disease with heart failure: Secondary | ICD-10-CM | POA: Diagnosis not present

## 2018-04-22 ENCOUNTER — Other Ambulatory Visit: Payer: Self-pay | Admitting: Internal Medicine

## 2018-04-22 ENCOUNTER — Telehealth: Payer: Self-pay | Admitting: Internal Medicine

## 2018-04-22 DIAGNOSIS — E1143 Type 2 diabetes mellitus with diabetic autonomic (poly)neuropathy: Secondary | ICD-10-CM

## 2018-04-22 DIAGNOSIS — Z794 Long term (current) use of insulin: Secondary | ICD-10-CM | POA: Diagnosis not present

## 2018-04-22 DIAGNOSIS — K2100 Gastro-esophageal reflux disease with esophagitis, without bleeding: Secondary | ICD-10-CM

## 2018-04-22 DIAGNOSIS — I110 Hypertensive heart disease with heart failure: Secondary | ICD-10-CM | POA: Diagnosis not present

## 2018-04-22 DIAGNOSIS — I482 Chronic atrial fibrillation: Secondary | ICD-10-CM | POA: Diagnosis not present

## 2018-04-22 DIAGNOSIS — S91302D Unspecified open wound, left foot, subsequent encounter: Secondary | ICD-10-CM | POA: Diagnosis not present

## 2018-04-22 DIAGNOSIS — I5023 Acute on chronic systolic (congestive) heart failure: Secondary | ICD-10-CM | POA: Diagnosis not present

## 2018-04-22 DIAGNOSIS — I5022 Chronic systolic (congestive) heart failure: Secondary | ICD-10-CM

## 2018-04-22 DIAGNOSIS — K3184 Gastroparesis: Secondary | ICD-10-CM

## 2018-04-22 MED ORDER — METOCLOPRAMIDE HCL 5 MG PO TABS: 5.0000 mg | ORAL_TABLET | Freq: Four times a day (QID) | ORAL | 0 refills | Status: DC

## 2018-04-22 MED ORDER — MIDODRINE HCL 10 MG PO TABS: 5.0000 mg | ORAL_TABLET | Freq: Three times a day (TID) | ORAL | 0 refills | Status: DC

## 2018-04-22 MED ORDER — PANTOPRAZOLE SODIUM 40 MG PO TBEC: 40.0000 mg | DELAYED_RELEASE_TABLET | Freq: Every day | ORAL | 1 refills | Status: DC

## 2018-04-22 NOTE — Telephone Encounter (Signed)
Copied from CRM [id number]. Topic: Quick Communication - Rx Refill/Question >> Apr 22, 2018  9:32 AM Arlyss Gandy, NT wrote: Medication: midodrine (PROAMATINE) 10 MG tablet, pantoprazole (PROTONIX) 40 MG tablet and metoCLOPramide (REGLAN) 5 MG tablet   Has the patient contacted their pharmacy? Yes.   (Agent: If no, request that the patient contact the pharmacy for the refill.) (Agent: If yes, when and what did the pharmacy advise?)  Preferred Pharmacy (with phone number or street name): [facility] - [place], [place] - [address] [phone] (Phone) [phone] (Fax)    Agent: Please be advised that RX refills may take up to 3 business days. We ask that you follow-up with your pharmacy.

## 2018-04-22 NOTE — Telephone Encounter (Signed)
Pls advise if ok to send refill.../lmb 

## 2018-04-22 NOTE — Telephone Encounter (Signed)
MD approved and sent electronically to pof../lmb  

## 2018-04-24 DIAGNOSIS — I110 Hypertensive heart disease with heart failure: Secondary | ICD-10-CM | POA: Diagnosis not present

## 2018-04-24 DIAGNOSIS — S91302D Unspecified open wound, left foot, subsequent encounter: Secondary | ICD-10-CM | POA: Diagnosis not present

## 2018-04-24 DIAGNOSIS — E1143 Type 2 diabetes mellitus with diabetic autonomic (poly)neuropathy: Secondary | ICD-10-CM | POA: Diagnosis not present

## 2018-04-24 DIAGNOSIS — I482 Chronic atrial fibrillation: Secondary | ICD-10-CM | POA: Diagnosis not present

## 2018-04-24 DIAGNOSIS — Z794 Long term (current) use of insulin: Secondary | ICD-10-CM | POA: Diagnosis not present

## 2018-04-24 DIAGNOSIS — I5023 Acute on chronic systolic (congestive) heart failure: Secondary | ICD-10-CM | POA: Diagnosis not present

## 2018-04-27 ENCOUNTER — Encounter: Payer: Self-pay | Admitting: Internal Medicine

## 2018-04-27 ENCOUNTER — Ambulatory Visit (INDEPENDENT_AMBULATORY_CARE_PROVIDER_SITE_OTHER): Payer: Medicare Other | Admitting: Internal Medicine

## 2018-04-27 ENCOUNTER — Other Ambulatory Visit (INDEPENDENT_AMBULATORY_CARE_PROVIDER_SITE_OTHER): Payer: Medicare Other

## 2018-04-27 VITALS — BP 142/74 | HR 90 | Temp 98.3°F | Resp 16 | Ht 69.0 in | Wt 266.5 lb

## 2018-04-27 DIAGNOSIS — I10 Essential (primary) hypertension: Secondary | ICD-10-CM

## 2018-04-27 DIAGNOSIS — E785 Hyperlipidemia, unspecified: Secondary | ICD-10-CM

## 2018-04-27 DIAGNOSIS — I5022 Chronic systolic (congestive) heart failure: Secondary | ICD-10-CM | POA: Diagnosis not present

## 2018-04-27 DIAGNOSIS — E118 Type 2 diabetes mellitus with unspecified complications: Secondary | ICD-10-CM | POA: Diagnosis not present

## 2018-04-27 DIAGNOSIS — Z23 Encounter for immunization: Secondary | ICD-10-CM | POA: Diagnosis not present

## 2018-04-27 LAB — COMPREHENSIVE METABOLIC PANEL
ALT: 13 U/L (ref 0–53)
AST: 21 U/L (ref 0–37)
Albumin: 3.1 g/dL — ABNORMAL LOW (ref 3.5–5.2)
Alkaline Phosphatase: 88 U/L (ref 39–117)
BUN: 9 mg/dL (ref 6–23)
CO2: 37 mEq/L — ABNORMAL HIGH (ref 19–32)
Calcium: 8.8 mg/dL (ref 8.4–10.5)
Chloride: 98 mEq/L (ref 96–112)
Creatinine, Ser: 0.86 mg/dL (ref 0.40–1.50)
GFR: 94.14 mL/min (ref >60.00)
Glucose, Bld: 95 mg/dL (ref 70–99)
Potassium: 3.7 mEq/L (ref 3.5–5.1)
Sodium: 140 mEq/L (ref 135–145)
Total Bilirubin: 0.8 mg/dL (ref 0.2–1.2)
Total Protein: 5.8 g/dL — ABNORMAL LOW (ref 6.0–8.3)

## 2018-04-27 LAB — LIPID PANEL
Cholesterol: 90 mg/dL (ref 0–200)
HDL: 39.1 mg/dL (ref >39.00)
LDL Cholesterol: 32 mg/dL (ref 0–99)
NonHDL: 50.5
Total CHOL/HDL Ratio: 2
Triglycerides: 93 mg/dL (ref 0.0–149.0)
VLDL: 18.6 mg/dL (ref 0.0–40.0)

## 2018-04-27 LAB — POCT GLYCOSYLATED HEMOGLOBIN (HGB A1C): Hemoglobin A1C: 5.3 % (ref 4.0–5.6)

## 2018-04-27 LAB — POCT GLUCOSE (DEVICE FOR HOME USE): Glucose Fasting, POC: 99 mg/dL (ref 70–99)

## 2018-04-27 MED ORDER — TORSEMIDE 20 MG PO TABS: 20.0000 mg | ORAL_TABLET | Freq: Every day | ORAL | 1 refills | Status: DC
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
## 2018-04-27 NOTE — Patient Instructions (Signed)
Heart Failure Heart failure is a condition in which the heart has trouble pumping blood because it has become weak or stiff. This means that the heart does not pump blood efficiently for the body to work well. For some people with heart failure, fluid may back up into the lungs and there may be swelling (edema) in the lower legs. Heart failure is usually a long-term (chronic) condition. It is important for you to take good care of yourself and follow the treatment plan from your health care provider. What are the causes? This condition is caused by some health problems, including:  High blood pressure (hypertension). Hypertension causes the heart muscle to work harder than normal. High blood pressure eventually causes the heart to become stiff and weak.  Coronary artery disease (CAD). CAD is the buildup of cholesterol and fat (plaques) in the arteries of the heart.  Heart attack (myocardial infarction). Injured tissue, which is caused by the heart attack, does not contract as well and the heart's ability to pump blood is weakened.  Abnormal heart valves. When the heart valves do not open and close properly, the heart muscle must pump harder to keep the blood flowing.  Heart muscle disease (cardiomyopathy or myocarditis). Heart muscle disease is damage to the heart muscle from a variety of causes, such as drug or alcohol abuse, infections, or unknown causes. These can increase the risk of heart failure.  Lung disease. When the lungs do not work properly, the heart must work harder.  What increases the risk? Risk of heart failure increases as a person ages. This condition is also more likely to develop in people who:  Are overweight.  Are male.  Smoke or chew tobacco.  Abuse alcohol or illegal drugs.  Have taken medicines that can damage the heart, such as chemotherapy drugs.  Have diabetes. ? High blood sugar (glucose) is associated with high fat (lipid) levels in the blood. ? Diabetes  can also damage tiny blood vessels that carry nutrients to the heart muscle.  Have abnormal heart rhythms.  Have thyroid problems.  Have low blood counts (anemia).  What are the signs or symptoms? Symptoms of this condition include:  Shortness of breath with activity, such as when climbing stairs.  Persistent cough.  Swelling of the feet, ankles, legs, or abdomen.  Unexplained weight gain.  Difficulty breathing when lying flat (orthopnea).  Waking from sleep because of the need to sit up and get more air.  Rapid heartbeat.  Fatigue and loss of energy.  Feeling light-headed, dizzy, or close to fainting.  Loss of appetite.  Nausea.  Increased urination during the night (nocturia).  Confusion.  How is this diagnosed? This condition is diagnosed based on:  Medical history, symptoms, and a physical exam.  Diagnostic tests, which may include: ? Echocardiogram. ? Electrocardiogram (ECG). ? Chest X-ray. ? Blood tests. ? Exercise stress test. ? Radionuclide scans. ? Cardiac catheterization and angiogram.  How is this treated? Treatment for this condition is aimed at managing the symptoms of heart failure. Medicines, behavioral changes, or other treatments may be necessary to treat heart failure. Medicines These may include:  Angiotensin-converting enzyme (ACE) inhibitors. This type of medicine blocks the effects of a blood protein called angiotensin-converting enzyme. ACE inhibitors relax (dilate) the blood vessels and help to lower blood pressure.  Angiotensin receptor blockers (ARBs). This type of medicine blocks the actions of a blood protein called angiotensin. ARBs dilate the blood vessels and help to lower blood pressure.  Water   pills (diuretics). Diuretics cause the kidneys to remove salt and water from the blood. The extra fluid is removed through urination, leaving a lower volume of blood that the heart has to pump.  Beta blockers. These improve heart  muscle strength and they prevent the heart from beating too quickly.  Digoxin. This increases the force of the heartbeat.  Healthy behavior changes These may include:  Reaching and maintaining a healthy weight.  Stopping smoking or chewing tobacco.  Eating heart-healthy foods.  Limiting or avoiding alcohol.  Stopping use of street drugs (illegal drugs).  Physical activity.  Other treatments These may include:  Surgery to open blocked coronary arteries or repair damaged heart valves.  Placement of a biventricular pacemaker to improve heart muscle function (cardiac resynchronization therapy). This device paces both the right ventricle and left ventricle.  Placement of a device to treat serious abnormal heart rhythms (implantable cardioverter defibrillator, or ICD).  Placement of a device to improve the pumping ability of the heart (left ventricular assist device, or LVAD).  Heart transplant. This can cure heart failure, and it is considered for certain patients who do not improve with other therapies.  Follow these instructions at home: Medicines  Take over-the-counter and prescription medicines only as told by your health care provider. Medicines are important in reducing the workload of your heart, slowing the progression of heart failure, and improving your symptoms. ? Do not stop taking your medicine unless your health care provider told you to do that. ? Do not skip any dose of medicine. ? Refill your prescriptions before you run out of medicine. You need your medicines every day. Eating and drinking   Eat heart-healthy foods. Talk with a dietitian to make an eating plan that is right for you. ? Choose foods that contain no trans fat and are low in saturated fat and cholesterol. Healthy choices include fresh or frozen fruits and vegetables, fish, lean meats, legumes, fat-free or low-fat dairy products, and whole-grain or high-fiber foods. ? Limit salt (sodium) if  directed by your health care provider. Sodium restriction may reduce symptoms of heart failure. Ask a dietitian to recommend heart-healthy seasonings. ? Use healthy cooking methods instead of frying. Healthy methods include roasting, grilling, broiling, baking, poaching, steaming, and stir-frying.  Limit your fluid intake if directed by your health care provider. Fluid restriction may reduce symptoms of heart failure. Lifestyle  Stop smoking or using chewing tobacco. Nicotine and tobacco can damage your heart and your blood vessels. Do not use nicotine gum or patches before talking to your health care provider.  Limit alcohol intake to no more than 1 drink per day for non-pregnant women and 2 drinks per day for men. One drink equals 12 oz of beer, 5 oz of wine, or 1 oz of hard liquor. ? Drinking more than that is harmful to your heart. Tell your health care provider if you drink alcohol several times a week. ? Talk with your health care provider about whether any level of alcohol use is safe for you. ? If your heart has already been damaged by alcohol or you have severe heart failure, drinking alcohol should be stopped completely.  Stop use of illegal drugs.  Lose weight if directed by your health care provider. Weight loss may reduce symptoms of heart failure.  Do moderate physical activity if directed by your health care provider. People who are elderly and people with severe heart failure should consult with a health care provider for physical activity recommendations.   Monitor important information  Weigh yourself every day. Keeping track of your weight daily helps you to notice excess fluid sooner. ? Weigh yourself every morning after you urinate and before you eat breakfast. ? Wear the same amount of clothing each time you weigh yourself. ? Record your daily weight. Provide your health care provider with your weight record.  Monitor and record your blood pressure as told by your health  care provider.  Check your pulse as told by your health care provider. Dealing with extreme temperatures  If the weather is extremely hot: ? Avoid vigorous physical activity. ? Use air conditioning or fans or seek a cooler location. ? Avoid caffeine and alcohol. ? Wear loose-fitting, lightweight, and light-colored clothing.  If the weather is extremely cold: ? Avoid vigorous physical activity. ? Layer your clothes. ? Wear mittens or gloves, a hat, and a scarf when you go outside. ? Avoid alcohol. General instructions  Manage other health conditions such as hypertension, diabetes, thyroid disease, or abnormal heart rhythms as told by your health care provider.  Learn to manage stress. If you need help to do this, ask your health care provider.  Plan rest periods when fatigued.  Get ongoing education and support as needed.  Participate in or seek rehabilitation as needed to maintain or improve independence and quality of life.  Stay up to date with immunizations. Keeping current on pneumococcal and influenza immunizations is especially important to prevent respiratory infections.  Keep all follow-up visits as told by your health care provider. This is important. Contact a health care provider if:  You have a rapid weight gain.  You have increasing shortness of breath that is unusual for you.  You are unable to participate in your usual physical activities.  You tire easily.  You cough more than normal, especially with physical activity.  You have any swelling or more swelling in areas such as your hands, feet, ankles, or abdomen.  You are unable to sleep because it is hard to breathe.  You feel like your heart is beating quickly (palpitations).  You become dizzy or light-headed when you stand up. Get help right away if:  You have difficulty breathing.  You notice or your family notices a change in your awareness, such as having trouble staying awake or having  difficulty with concentration.  You have pain or discomfort in your chest.  You have an episode of fainting (syncope). This information is not intended to replace advice given to you by your health care provider. Make sure you discuss any questions you have with your health care provider. Document Released: 06/24/2005 Document Revised: 02/27/2016 Document Reviewed: 01/17/2016 Elsevier Interactive Patient Education  2018 [employer]  

## 2018-04-27 NOTE — Progress Notes (Signed)
Subjective:  Patient ID: Cody Hale, male    DOB: [birth year]  Age: 67 y.o. MRN: [id number]  CC: Hypertension; Hyperlipidemia; Diabetes; Atrial Fibrillation; and Congestive Heart Failure   HPI Cody Hale presents for f/up - He was recently seen in the ED for lower extremity edema and fluid overload.  He has had a minimal improvement with furosemide.  He does not think it has helped much with the lower extremity edema.  He was also recently treated for pneumonia.  He tells me the cough has resolved.  He continues to have mild shortness of breath but denies chest pain, diaphoresis, fever, or chills.  He has gained weight over the last month.  Outpatient Medications Prior to Visit  Medication Sig Dispense Refill  . fluticasone (FLONASE) 50 MCG/ACT nasal spray Place 2 sprays into both nostrils daily. 16 g 5  . levocetirizine (XYZAL) 5 MG tablet Take 1 tablet (5 mg total) by mouth every evening. 90 tablet 1  . metoCLOPramide (REGLAN) 5 MG tablet Take 1 tablet (5 mg total) by mouth 4 (four) times daily. 360 tablet 0  . midodrine (PROAMATINE) 10 MG tablet Take 0.5 tablets (5 mg total) by mouth 3 (three) times daily. 270 tablet 0  . nitroGLYCERIN (NITROSTAT) 0.4 MG SL tablet Place 1 tablet (0.4 mg total) under the tongue every 5 (five) minutes as needed for chest pain. Max 3 doses. 10 tablet 0  . pantoprazole (PROTONIX) 40 MG tablet Take 1 tablet (40 mg total) by mouth daily. 90 tablet 1  . polyethylene glycol (MIRALAX) packet Take 17 g by mouth daily. 14 each 0  . Probiotic Product (RISA-BID PROBIOTIC) TABS Take 1 tablet by mouth 2 (two) times daily.    . simvastatin (ZOCOR) 20 MG tablet TAKE 1 TABLET BY MOUTH ONCE DAILY. (Patient taking differently: Take 20 mg by mouth every morning. ) 30 tablet 5  . tiZANidine (ZANAFLEX) 4 MG tablet Take 1 tablet (4 mg total) by mouth every 8 (eight) hours as needed for muscle spasms. 60 tablet 0  . VIIBRYD 40 MG TABS TAKE 1 TABLET BY MOUTH ONCE DAILY.  (Patient taking differently: Take 40 mg by mouth daily. ) 30 tablet 0  . XARELTO 20 MG TABS tablet TAKE 1 TABLET BY MOUTH ONCE DAILY. (Patient taking differently: Take 20 mg by mouth daily. ) 30 tablet 5  . furosemide (LASIX) 20 MG tablet Take 1 tablet (20 mg total) by mouth daily. 30 tablet 0  . Insulin Glargine (LANTUS) 100 UNIT/ML Solostar Pen Inject 32 Units into the skin at bedtime.     . ondansetron (ZOFRAN) 4 MG tablet Take 4 mg by mouth every 8 (eight) hours as needed for nausea or vomiting.    . INVOKANA 100 MG TABS tablet TAKE ONE TABLET BY MOUTH ONCE DAILY BEFORE BREAKFAST. (Patient taking differently: Take 100 mg by mouth daily. ) 30 tablet 0  . Multiple Vitamins-Minerals (THEREMS M PO) Take 1 tablet by mouth daily. Take 1 tablet by mouth once a day      No facility-administered medications prior to visit.     ROS Review of Systems  Constitutional: Positive for unexpected weight change. Negative for chills, diaphoresis, fatigue and fever.  HENT: Negative.   Eyes: Negative for visual disturbance.  Respiratory: Positive for shortness of breath. Negative for cough, chest tightness and wheezing.   Cardiovascular: Positive for leg swelling. Negative for chest pain and palpitations.  Gastrointestinal: Negative for abdominal pain, diarrhea, nausea and vomiting.  Endocrine: Negative.   Genitourinary: Negative.  Negative for difficulty urinating and dysuria.  Musculoskeletal: Negative.  Negative for arthralgias and myalgias.  Skin: Negative.  Negative for color change.  Neurological: Negative.  Negative for dizziness, weakness and light-headedness.  Hematological: Negative for adenopathy. Does not bruise/bleed easily.  Psychiatric/Behavioral: Negative.     Objective:  BP (!) 142/74 (BP Location: Left Arm, Patient Position: Sitting, Cuff Size: Normal)   Pulse 90   Temp 98.3 F (36.8 C) (Oral)   Resp 16   Ht 5\' 9"  (1.753 m)   Wt 266 lb 8 oz (120.9 kg)   SpO2 94%   BMI 39.36  kg/m   BP Readings from Last 3 Encounters:  04/28/18 (!) 150/80  04/27/18 (!) 142/74  04/17/18 (!) 161/107    Wt Readings from Last 3 Encounters:  04/28/18 265 lb (120.2 kg)  04/27/18 266 lb 8 oz (120.9 kg)  04/17/18 239 lb 3.2 oz (108.5 kg)    Physical Exam  Constitutional: He is oriented to person, place, and time. No distress.  HENT:  Mouth/Throat: Oropharynx is clear and moist. No oropharyngeal exudate.  Eyes: Conjunctivae are normal. No scleral icterus.  Neck: Normal range of motion. Neck supple. No JVD present. No thyromegaly present.  Cardiovascular: Normal rate. An irregularly irregular rhythm present. Exam reveals gallop and S3.  Murmur heard.  Systolic murmur is present with a grade of 1/6. Pulmonary/Chest: Effort normal and breath sounds normal. No respiratory distress. He has no wheezes. He has no rales.  Abdominal: Soft. Bowel sounds are normal. He exhibits no mass. There is no hepatosplenomegaly. There is no tenderness.  Musculoskeletal: He exhibits edema.  Both lower extremities are covered with Unna boots  Lymphadenopathy:    He has no cervical adenopathy.  Neurological: He is oriented to person, place, and time.  Skin: He is not diaphoretic.  Vitals reviewed.   Lab Results  Component Value Date   WBC 4.4 04/17/2018   HGB 13.9 04/17/2018   HCT 44.3 04/17/2018   PLT 190 04/17/2018   GLUCOSE 95 04/27/2018   CHOL 90 04/27/2018   TRIG 93.0 04/27/2018   HDL 39.10 04/27/2018   LDLDIRECT 84.2 01/31/2014   LDLCALC 32 04/27/2018   ALT 13 04/27/2018   AST 21 04/27/2018   NA 140 04/27/2018   K 3.7 04/27/2018   CL 98 04/27/2018   CREATININE 0.86 04/27/2018   BUN 9 04/27/2018   CO2 37 (H) 04/27/2018   TSH 0.80 12/31/2016   PSA 0.36 05/30/2014   INR 2.61 02/10/2017   HGBA1C 5.3 04/27/2018   MICROALBUR 4.0 (H) 07/14/2017    Dg Chest 2 View  Result Date: 04/17/2018 CLINICAL DATA:  Initial evaluation for acute cough. EXAM: CHEST - 2 VIEW COMPARISON:   Prior radiograph from 04/12/2018. FINDINGS: Median sternotomy wires underlying cardiomegaly, stable. Mediastinal silhouette within normal limits. Lungs mildly hypoinflated. Small layering left pleural effusion, increased from previous. Associated left basilar opacity may reflect atelectasis or infiltrate. Mild diffuse vascular congestion without overt pulmonary edema. No pneumothorax. No acute osseous abnormality. IMPRESSION: 1. Cardiomegaly with mild diffuse pulmonary vascular congestion with small left pleural effusion. 2. Associated left basilar opacity may reflect atelectasis or infiltrate. Electronically Signed   By: Rise Mu M.D.   On: 04/17/2018 13:37    Assessment & Plan:   Eugune was seen today for hypertension, hyperlipidemia, diabetes, atrial fibrillation and congestive heart failure.  Diagnoses and all orders for this visit:  Need for influenza vaccination -  Flu vaccine HIGH DOSE PF (Fluzone High dose)  Chronic systolic heart failure (HCC)- I have asked him to change the furosemide to torsemide for better bioavailability and efficacy. -     torsemide (DEMADEX) 20 MG tablet; Take 1 tablet (20 mg total) by mouth daily.  Type II diabetes mellitus with manifestations (HCC)- His A1c is down to 5.3%.  He is at risk of hypoglycemia.  I have asked him to stop using the basal insulin. -     Comprehensive metabolic panel; Future -     POCT glycosylated hemoglobin (Hb A1C) -     POCT Glucose (Device for Home Use)  Essential hypertension, malignant- His blood pressure is adequately well controlled today. -     Comprehensive metabolic panel; Future  Hyperlipidemia with target LDL less than 70- He has achieved his LDL goal and is doing well on the statin. -     Lipid panel; Future -     Comprehensive metabolic panel; Future  Need for pneumococcal vaccination -     Pneumococcal polysaccharide vaccine 23-valent greater than or equal to 2yo subcutaneous/IM   I have  discontinued Lucio D. Mathieson's INVOKANA, ondansetron, Insulin Glargine, Multiple Vitamins-Minerals (THEREMS M PO), and furosemide. I am also having him start on torsemide. Additionally, I am having him maintain his fluticasone, nitroGLYCERIN, levocetirizine, VIIBRYD, simvastatin, XARELTO, tiZANidine, RISA-BID PROBIOTIC, polyethylene glycol, midodrine, pantoprazole, and metoCLOPramide.  Meds ordered this encounter  Medications  . torsemide (DEMADEX) 20 MG tablet    Sig: Take 1 tablet (20 mg total) by mouth daily.    Dispense:  90 tablet    Refill:  1     Follow-up: Return in about 6 months (around 10/27/2018).  Sanda Linger, MD

## 2018-04-28 ENCOUNTER — Encounter: Payer: Self-pay | Admitting: Student

## 2018-04-28 ENCOUNTER — Ambulatory Visit (INDEPENDENT_AMBULATORY_CARE_PROVIDER_SITE_OTHER): Payer: Medicare Other | Admitting: Student

## 2018-04-28 VITALS — BP 150/80 | HR 94 | Ht 69.0 in | Wt 265.0 lb

## 2018-04-28 DIAGNOSIS — Z794 Long term (current) use of insulin: Secondary | ICD-10-CM | POA: Diagnosis not present

## 2018-04-28 DIAGNOSIS — E785 Hyperlipidemia, unspecified: Secondary | ICD-10-CM | POA: Diagnosis not present

## 2018-04-28 DIAGNOSIS — Z952 Presence of prosthetic heart valve: Secondary | ICD-10-CM | POA: Diagnosis not present

## 2018-04-28 DIAGNOSIS — Z01818 Encounter for other preprocedural examination: Secondary | ICD-10-CM | POA: Diagnosis not present

## 2018-04-28 DIAGNOSIS — I5023 Acute on chronic systolic (congestive) heart failure: Secondary | ICD-10-CM | POA: Diagnosis not present

## 2018-04-28 DIAGNOSIS — I428 Other cardiomyopathies: Secondary | ICD-10-CM

## 2018-04-28 DIAGNOSIS — I110 Hypertensive heart disease with heart failure: Secondary | ICD-10-CM | POA: Diagnosis not present

## 2018-04-28 DIAGNOSIS — I10 Essential (primary) hypertension: Secondary | ICD-10-CM

## 2018-04-28 DIAGNOSIS — I5042 Chronic combined systolic (congestive) and diastolic (congestive) heart failure: Secondary | ICD-10-CM

## 2018-04-28 DIAGNOSIS — S91302D Unspecified open wound, left foot, subsequent encounter: Secondary | ICD-10-CM | POA: Diagnosis not present

## 2018-04-28 DIAGNOSIS — E1143 Type 2 diabetes mellitus with diabetic autonomic (poly)neuropathy: Secondary | ICD-10-CM | POA: Diagnosis not present

## 2018-04-28 DIAGNOSIS — I482 Chronic atrial fibrillation, unspecified: Secondary | ICD-10-CM | POA: Diagnosis not present

## 2018-04-28 MED ORDER — CARVEDILOL 3.125 MG PO TABS: 3.1250 mg | ORAL_TABLET | Freq: Two times a day (BID) | ORAL | 3 refills | Status: DC

## 2018-04-28 NOTE — Patient Instructions (Signed)
Medication Instructions:  Your physician has recommended you make the following change in your medication:  Coreg 3.125 mg Two Times Daily   If you need a refill on your cardiac medications before your next appointment, please call your pharmacy.   Lab work: NONE  If you have labs (blood work) drawn today and your tests are completely normal, you will receive your results only by: Marland Kitchen MyChart Message (if you have MyChart) OR . A paper copy in the mail If you have any lab test that is abnormal or we need to change your treatment, we will call you to review the results.  Testing/Procedures: NONE   Follow-Up: At [facility], you and your health needs are our priority.  As part of our continuing mission to provide you with exceptional heart care, we have created designated Provider Care Teams.  These Care Teams include your primary Cardiologist (physician) and Advanced Practice Providers (APPs -  Physician Assistants and Nurse Practitioners) who all work together to provide you with the care you need, when you need it. You will need a follow up appointment in 6-8 weeks.  Please call our office 2 months in advance to schedule this appointment.  You may see Prentice Docker, MD or one of the following Advanced Practice Providers on your designated Care Team:   Randall An, PA-C [facility]) . Jacolyn Reedy, PA-C [facility])  Any Other Special Instructions Will Be Listed Below (If Applicable).  Your physician has requested that you regularly monitor and record your blood pressure readings at home. Please use the same machine at the same time of day to check your readings and record them to bring to your follow-up visit.  Fax back to office in 2 weeks [phone].  Thank you for choosing [facility]!

## 2018-04-29 ENCOUNTER — Telehealth: Payer: Self-pay | Admitting: Internal Medicine

## 2018-04-29 ENCOUNTER — Telehealth: Payer: Self-pay

## 2018-04-29 DIAGNOSIS — E118 Type 2 diabetes mellitus with unspecified complications: Secondary | ICD-10-CM

## 2018-04-29 NOTE — Telephone Encounter (Signed)
Letter from pts doctor is on AB desk. Pt will return 05/18/18 to schedule Tcs and it's ok to hold Xarelto x 48 hrs prior to TCS.

## 2018-04-29 NOTE — Telephone Encounter (Signed)
Copied from CRM [id number]. Topic: General - Inquiry >> Apr 29, 2018 11:49 AM Maia Petties wrote: Reason for CRM: They received order to stop checking blood sugar. Pt is still getting Insulin at bedtime. Morning blood sugars have been dropping to 60s. Please call to advise to check blood sugars or not.   Also pt had weight loss in rehab. He is eating again and gaining weight.

## 2018-04-30 DIAGNOSIS — S91302D Unspecified open wound, left foot, subsequent encounter: Secondary | ICD-10-CM | POA: Diagnosis not present

## 2018-04-30 DIAGNOSIS — E1143 Type 2 diabetes mellitus with diabetic autonomic (poly)neuropathy: Secondary | ICD-10-CM | POA: Diagnosis not present

## 2018-04-30 DIAGNOSIS — I110 Hypertensive heart disease with heart failure: Secondary | ICD-10-CM | POA: Diagnosis not present

## 2018-04-30 DIAGNOSIS — I5023 Acute on chronic systolic (congestive) heart failure: Secondary | ICD-10-CM | POA: Diagnosis not present

## 2018-04-30 DIAGNOSIS — Z794 Long term (current) use of insulin: Secondary | ICD-10-CM | POA: Diagnosis not present

## 2018-04-30 DIAGNOSIS — I482 Chronic atrial fibrillation: Secondary | ICD-10-CM | POA: Diagnosis not present

## 2018-04-30 NOTE — Telephone Encounter (Signed)
Per PCP - all DM medications were dc'ed at last office visit.   Called [name] [phone]) and spoke to Lupita Leash and informed of same. Written orders to dc DM meds have been printed signed and faxed to [name]'s attn at [phone]

## 2018-05-01 DIAGNOSIS — Z794 Long term (current) use of insulin: Secondary | ICD-10-CM | POA: Diagnosis not present

## 2018-05-01 DIAGNOSIS — S91302D Unspecified open wound, left foot, subsequent encounter: Secondary | ICD-10-CM | POA: Diagnosis not present

## 2018-05-01 DIAGNOSIS — I482 Chronic atrial fibrillation: Secondary | ICD-10-CM | POA: Diagnosis not present

## 2018-05-01 DIAGNOSIS — E1143 Type 2 diabetes mellitus with diabetic autonomic (poly)neuropathy: Secondary | ICD-10-CM | POA: Diagnosis not present

## 2018-05-01 DIAGNOSIS — I5023 Acute on chronic systolic (congestive) heart failure: Secondary | ICD-10-CM | POA: Diagnosis not present

## 2018-05-01 DIAGNOSIS — I110 Hypertensive heart disease with heart failure: Secondary | ICD-10-CM | POA: Diagnosis not present

## 2018-05-05 DIAGNOSIS — I5023 Acute on chronic systolic (congestive) heart failure: Secondary | ICD-10-CM | POA: Diagnosis not present

## 2018-05-05 DIAGNOSIS — S91302D Unspecified open wound, left foot, subsequent encounter: Secondary | ICD-10-CM | POA: Diagnosis not present

## 2018-05-05 DIAGNOSIS — I110 Hypertensive heart disease with heart failure: Secondary | ICD-10-CM | POA: Diagnosis not present

## 2018-05-05 DIAGNOSIS — Z794 Long term (current) use of insulin: Secondary | ICD-10-CM | POA: Diagnosis not present

## 2018-05-05 DIAGNOSIS — I482 Chronic atrial fibrillation: Secondary | ICD-10-CM | POA: Diagnosis not present

## 2018-05-05 DIAGNOSIS — E1143 Type 2 diabetes mellitus with diabetic autonomic (poly)neuropathy: Secondary | ICD-10-CM | POA: Diagnosis not present

## 2018-05-07 DIAGNOSIS — I110 Hypertensive heart disease with heart failure: Secondary | ICD-10-CM | POA: Diagnosis not present

## 2018-05-07 DIAGNOSIS — I482 Chronic atrial fibrillation: Secondary | ICD-10-CM | POA: Diagnosis not present

## 2018-05-07 DIAGNOSIS — Z794 Long term (current) use of insulin: Secondary | ICD-10-CM | POA: Diagnosis not present

## 2018-05-07 DIAGNOSIS — S91302D Unspecified open wound, left foot, subsequent encounter: Secondary | ICD-10-CM | POA: Diagnosis not present

## 2018-05-07 DIAGNOSIS — I5023 Acute on chronic systolic (congestive) heart failure: Secondary | ICD-10-CM | POA: Diagnosis not present

## 2018-05-07 DIAGNOSIS — E1143 Type 2 diabetes mellitus with diabetic autonomic (poly)neuropathy: Secondary | ICD-10-CM | POA: Diagnosis not present

## 2018-05-08 DIAGNOSIS — I482 Chronic atrial fibrillation: Secondary | ICD-10-CM | POA: Diagnosis not present

## 2018-05-08 DIAGNOSIS — S91302D Unspecified open wound, left foot, subsequent encounter: Secondary | ICD-10-CM | POA: Diagnosis not present

## 2018-05-08 DIAGNOSIS — E1143 Type 2 diabetes mellitus with diabetic autonomic (poly)neuropathy: Secondary | ICD-10-CM | POA: Diagnosis not present

## 2018-05-08 DIAGNOSIS — Z794 Long term (current) use of insulin: Secondary | ICD-10-CM | POA: Diagnosis not present

## 2018-05-08 DIAGNOSIS — I5023 Acute on chronic systolic (congestive) heart failure: Secondary | ICD-10-CM | POA: Diagnosis not present

## 2018-05-08 DIAGNOSIS — I110 Hypertensive heart disease with heart failure: Secondary | ICD-10-CM | POA: Diagnosis not present

## 2018-05-12 DIAGNOSIS — Z794 Long term (current) use of insulin: Secondary | ICD-10-CM | POA: Diagnosis not present

## 2018-05-12 DIAGNOSIS — E1143 Type 2 diabetes mellitus with diabetic autonomic (poly)neuropathy: Secondary | ICD-10-CM | POA: Diagnosis not present

## 2018-05-12 DIAGNOSIS — I482 Chronic atrial fibrillation: Secondary | ICD-10-CM | POA: Diagnosis not present

## 2018-05-12 DIAGNOSIS — I5023 Acute on chronic systolic (congestive) heart failure: Secondary | ICD-10-CM | POA: Diagnosis not present

## 2018-05-12 DIAGNOSIS — I110 Hypertensive heart disease with heart failure: Secondary | ICD-10-CM | POA: Diagnosis not present

## 2018-05-12 DIAGNOSIS — S91302D Unspecified open wound, left foot, subsequent encounter: Secondary | ICD-10-CM | POA: Diagnosis not present

## 2018-05-14 DIAGNOSIS — I110 Hypertensive heart disease with heart failure: Secondary | ICD-10-CM | POA: Diagnosis not present

## 2018-05-14 DIAGNOSIS — I5023 Acute on chronic systolic (congestive) heart failure: Secondary | ICD-10-CM | POA: Diagnosis not present

## 2018-05-14 DIAGNOSIS — Z794 Long term (current) use of insulin: Secondary | ICD-10-CM | POA: Diagnosis not present

## 2018-05-14 DIAGNOSIS — I482 Chronic atrial fibrillation: Secondary | ICD-10-CM | POA: Diagnosis not present

## 2018-05-14 DIAGNOSIS — E1143 Type 2 diabetes mellitus with diabetic autonomic (poly)neuropathy: Secondary | ICD-10-CM | POA: Diagnosis not present

## 2018-05-14 DIAGNOSIS — S91302D Unspecified open wound, left foot, subsequent encounter: Secondary | ICD-10-CM | POA: Diagnosis not present

## 2018-05-15 DIAGNOSIS — E1143 Type 2 diabetes mellitus with diabetic autonomic (poly)neuropathy: Secondary | ICD-10-CM | POA: Diagnosis not present

## 2018-05-15 DIAGNOSIS — S91302D Unspecified open wound, left foot, subsequent encounter: Secondary | ICD-10-CM | POA: Diagnosis not present

## 2018-05-15 DIAGNOSIS — I482 Chronic atrial fibrillation: Secondary | ICD-10-CM | POA: Diagnosis not present

## 2018-05-15 DIAGNOSIS — I5023 Acute on chronic systolic (congestive) heart failure: Secondary | ICD-10-CM | POA: Diagnosis not present

## 2018-05-15 DIAGNOSIS — I110 Hypertensive heart disease with heart failure: Secondary | ICD-10-CM | POA: Diagnosis not present

## 2018-05-15 DIAGNOSIS — Z794 Long term (current) use of insulin: Secondary | ICD-10-CM | POA: Diagnosis not present

## 2018-05-16 ENCOUNTER — Inpatient Hospital Stay (HOSPITAL_COMMUNITY)
Admission: EM | Admit: 2018-05-16 | Discharge: 2018-05-18 | DRG: 292 | Disposition: A | Discharge status: Skilled Nursing Facility | Payer: Medicare Other | Attending: Internal Medicine | Admitting: Internal Medicine

## 2018-05-16 ENCOUNTER — Emergency Department (HOSPITAL_COMMUNITY): Payer: Medicare Other

## 2018-05-16 ENCOUNTER — Encounter (HOSPITAL_COMMUNITY): Payer: Self-pay | Admitting: Emergency Medicine

## 2018-05-16 ENCOUNTER — Other Ambulatory Visit: Payer: Self-pay

## 2018-05-16 DIAGNOSIS — I482 Chronic atrial fibrillation, unspecified: Secondary | ICD-10-CM | POA: Diagnosis not present

## 2018-05-16 DIAGNOSIS — M25521 Pain in right elbow: Secondary | ICD-10-CM | POA: Diagnosis not present

## 2018-05-16 DIAGNOSIS — I10 Essential (primary) hypertension: Secondary | ICD-10-CM | POA: Diagnosis present

## 2018-05-16 DIAGNOSIS — Z953 Presence of xenogenic heart valve: Secondary | ICD-10-CM

## 2018-05-16 DIAGNOSIS — S59901A Unspecified injury of right elbow, initial encounter: Secondary | ICD-10-CM | POA: Diagnosis not present

## 2018-05-16 DIAGNOSIS — I110 Hypertensive heart disease with heart failure: Principal | ICD-10-CM | POA: Diagnosis present

## 2018-05-16 DIAGNOSIS — I428 Other cardiomyopathies: Secondary | ICD-10-CM

## 2018-05-16 DIAGNOSIS — W010XXA Fall on same level from slipping, tripping and stumbling without subsequent striking against object, initial encounter: Secondary | ICD-10-CM | POA: Diagnosis present

## 2018-05-16 DIAGNOSIS — Z7901 Long term (current) use of anticoagulants: Secondary | ICD-10-CM

## 2018-05-16 DIAGNOSIS — I5023 Acute on chronic systolic (congestive) heart failure: Secondary | ICD-10-CM | POA: Diagnosis not present

## 2018-05-16 DIAGNOSIS — M25551 Pain in right hip: Secondary | ICD-10-CM | POA: Diagnosis not present

## 2018-05-16 DIAGNOSIS — Z91048 Other nonmedicinal substance allergy status: Secondary | ICD-10-CM

## 2018-05-16 DIAGNOSIS — J449 Chronic obstructive pulmonary disease, unspecified: Secondary | ICD-10-CM | POA: Diagnosis present

## 2018-05-16 DIAGNOSIS — Z8249 Family history of ischemic heart disease and other diseases of the circulatory system: Secondary | ICD-10-CM

## 2018-05-16 DIAGNOSIS — S4981XA Other specified injuries of right shoulder and upper arm, initial encounter: Secondary | ICD-10-CM | POA: Diagnosis not present

## 2018-05-16 DIAGNOSIS — M79601 Pain in right arm: Secondary | ICD-10-CM | POA: Diagnosis present

## 2018-05-16 DIAGNOSIS — Z79891 Long term (current) use of opiate analgesic: Secondary | ICD-10-CM

## 2018-05-16 DIAGNOSIS — I2510 Atherosclerotic heart disease of native coronary artery without angina pectoris: Secondary | ICD-10-CM | POA: Diagnosis present

## 2018-05-16 DIAGNOSIS — E1143 Type 2 diabetes mellitus with diabetic autonomic (poly)neuropathy: Secondary | ICD-10-CM | POA: Diagnosis present

## 2018-05-16 DIAGNOSIS — K3184 Gastroparesis: Secondary | ICD-10-CM | POA: Diagnosis present

## 2018-05-16 DIAGNOSIS — G4730 Sleep apnea, unspecified: Secondary | ICD-10-CM | POA: Diagnosis present

## 2018-05-16 DIAGNOSIS — Z888 Allergy status to other drugs, medicaments and biological substances status: Secondary | ICD-10-CM

## 2018-05-16 DIAGNOSIS — J90 Pleural effusion, not elsewhere classified: Secondary | ICD-10-CM

## 2018-05-16 DIAGNOSIS — Z79899 Other long term (current) drug therapy: Secondary | ICD-10-CM

## 2018-05-16 DIAGNOSIS — R0902 Hypoxemia: Secondary | ICD-10-CM | POA: Diagnosis not present

## 2018-05-16 DIAGNOSIS — S199XXA Unspecified injury of neck, initial encounter: Secondary | ICD-10-CM | POA: Diagnosis not present

## 2018-05-16 DIAGNOSIS — E785 Hyperlipidemia, unspecified: Secondary | ICD-10-CM | POA: Diagnosis present

## 2018-05-16 DIAGNOSIS — M25511 Pain in right shoulder: Secondary | ICD-10-CM | POA: Diagnosis not present

## 2018-05-16 DIAGNOSIS — F419 Anxiety disorder, unspecified: Secondary | ICD-10-CM | POA: Diagnosis present

## 2018-05-16 DIAGNOSIS — S8991XA Unspecified injury of right lower leg, initial encounter: Secondary | ICD-10-CM | POA: Diagnosis not present

## 2018-05-16 DIAGNOSIS — I509 Heart failure, unspecified: Secondary | ICD-10-CM | POA: Diagnosis not present

## 2018-05-16 DIAGNOSIS — S6991XA Unspecified injury of right wrist, hand and finger(s), initial encounter: Secondary | ICD-10-CM | POA: Diagnosis not present

## 2018-05-16 DIAGNOSIS — Z87891 Personal history of nicotine dependence: Secondary | ICD-10-CM

## 2018-05-16 DIAGNOSIS — I447 Left bundle-branch block, unspecified: Secondary | ICD-10-CM | POA: Diagnosis present

## 2018-05-16 DIAGNOSIS — Y92122 Bedroom in nursing home as the place of occurrence of the external cause: Secondary | ICD-10-CM

## 2018-05-16 DIAGNOSIS — I5022 Chronic systolic (congestive) heart failure: Secondary | ICD-10-CM

## 2018-05-16 DIAGNOSIS — E876 Hypokalemia: Secondary | ICD-10-CM | POA: Diagnosis present

## 2018-05-16 DIAGNOSIS — S0990XA Unspecified injury of head, initial encounter: Secondary | ICD-10-CM | POA: Diagnosis not present

## 2018-05-16 DIAGNOSIS — M25561 Pain in right knee: Secondary | ICD-10-CM | POA: Diagnosis not present

## 2018-05-16 DIAGNOSIS — S299XXA Unspecified injury of thorax, initial encounter: Secondary | ICD-10-CM | POA: Diagnosis not present

## 2018-05-16 DIAGNOSIS — Z6836 Body mass index (BMI) 36.0-36.9, adult: Secondary | ICD-10-CM

## 2018-05-16 DIAGNOSIS — W19XXXA Unspecified fall, initial encounter: Secondary | ICD-10-CM | POA: Diagnosis not present

## 2018-05-16 DIAGNOSIS — S79911A Unspecified injury of right hip, initial encounter: Secondary | ICD-10-CM | POA: Diagnosis not present

## 2018-05-16 DIAGNOSIS — M25531 Pain in right wrist: Secondary | ICD-10-CM | POA: Diagnosis not present

## 2018-05-16 DIAGNOSIS — Z794 Long term (current) use of insulin: Secondary | ICD-10-CM

## 2018-05-16 DIAGNOSIS — Z96641 Presence of right artificial hip joint: Secondary | ICD-10-CM | POA: Diagnosis present

## 2018-05-16 LAB — CBC WITH DIFFERENTIAL/PLATELET
Abs Immature Granulocytes: 0.02 10*3/uL (ref 0.00–0.07)
Basophils Absolute: 0 10*3/uL (ref 0.0–0.1)
Basophils Relative: 1 %
Eosinophils Absolute: 0.1 10*3/uL (ref 0.0–0.5)
Eosinophils Relative: 2 %
HCT: 42.5 % (ref 39.0–52.0)
Hemoglobin: 13.1 g/dL (ref 13.0–17.0)
Immature Granulocytes: 0 %
Lymphocytes Relative: 10 %
Lymphs Abs: 0.6 10*3/uL — ABNORMAL LOW (ref 0.7–4.0)
MCH: 28.8 pg (ref 26.0–34.0)
MCHC: 30.8 g/dL (ref 30.0–36.0)
MCV: 93.4 fL (ref 80.0–100.0)
Monocytes Absolute: 0.6 10*3/uL (ref 0.1–1.0)
Monocytes Relative: 11 %
Neutro Abs: 4.5 10*3/uL (ref 1.7–7.7)
Neutrophils Relative %: 76 %
Platelets: 216 10*3/uL (ref 150–400)
RBC: 4.55 MIL/uL (ref 4.22–5.81)
RDW: 15.9 % — ABNORMAL HIGH (ref 11.5–15.5)
WBC: 5.9 10*3/uL (ref 4.0–10.5)
nRBC: 0 % (ref 0.0–0.2)

## 2018-05-16 LAB — LACTIC ACID, PLASMA
Lactic Acid, Venous: 1.1 mmol/L (ref 0.5–1.9)
Lactic Acid, Venous: 1.2 mmol/L (ref 0.5–1.9)

## 2018-05-16 LAB — BASIC METABOLIC PANEL
Anion gap: 9 (ref 5–15)
BUN: 7 mg/dL — ABNORMAL LOW (ref 8–23)
CO2: 41 mmol/L — ABNORMAL HIGH (ref 22–32)
Calcium: 8.3 mg/dL — ABNORMAL LOW (ref 8.9–10.3)
Chloride: 93 mmol/L — ABNORMAL LOW (ref 98–111)
Creatinine, Ser: 0.78 mg/dL (ref 0.61–1.24)
GFR calc Af Amer: >60 mL/min (ref >60)
GFR calc non Af Amer: >60 mL/min (ref >60)
Glucose, Bld: 176 mg/dL — ABNORMAL HIGH (ref 70–99)
Potassium: 2.8 mmol/L — ABNORMAL LOW (ref 3.5–5.1)
Sodium: 143 mmol/L (ref 135–145)

## 2018-05-16 LAB — TROPONIN I: Troponin I: 0.04 ng/mL (ref <0.03)

## 2018-05-16 LAB — URINALYSIS, ROUTINE W REFLEX MICROSCOPIC
Bacteria, UA: NONE SEEN
Bilirubin Urine: NEGATIVE
Glucose, UA: NEGATIVE mg/dL
Ketones, ur: NEGATIVE mg/dL
Leukocytes, UA: NEGATIVE
Nitrite: NEGATIVE
Protein, ur: 30 mg/dL — AB
Specific Gravity, Urine: 1.018 (ref 1.005–1.030)
pH: 7 (ref 5.0–8.0)

## 2018-05-16 LAB — BRAIN NATRIURETIC PEPTIDE: B Natriuretic Peptide: 404 pg/mL — ABNORMAL HIGH (ref 0.0–100.0)

## 2018-05-16 LAB — MAGNESIUM: Magnesium: 1.7 mg/dL (ref 1.7–2.4)

## 2018-05-16 LAB — GLUCOSE, CAPILLARY
Glucose-Capillary: 149 mg/dL — ABNORMAL HIGH (ref 70–99)
Glucose-Capillary: 188 mg/dL — ABNORMAL HIGH (ref 70–99)

## 2018-05-16 LAB — PROCALCITONIN: Procalcitonin: <0.1 ng/mL

## 2018-05-16 MED ORDER — INSULIN ASPART 100 UNIT/ML ~~LOC~~ SOLN
0.0000 [IU] | Freq: Three times a day (TID) | SUBCUTANEOUS | Status: DC
  Administered 2018-05-16: 1 [IU] via SUBCUTANEOUS
  Administered 2018-05-17: 2 [IU] via SUBCUTANEOUS
  Administered 2018-05-17: 1 [IU] via SUBCUTANEOUS
  Administered 2018-05-18: 2 [IU] via SUBCUTANEOUS
  Administered 2018-05-18: 6 [IU] via SUBCUTANEOUS

## 2018-05-16 MED ORDER — METOCLOPRAMIDE HCL 10 MG PO TABS
5.0000 mg | ORAL_TABLET | Freq: Three times a day (TID) | ORAL | Status: DC
  Administered 2018-05-16 – 2018-05-18 (×8): 5 mg via ORAL
  Filled 2018-05-16 (×8): qty 1

## 2018-05-16 MED ORDER — FUROSEMIDE 10 MG/ML IJ SOLN
20.0000 mg | Freq: Once | INTRAMUSCULAR | Status: AC
  Administered 2018-05-16: 20 mg via INTRAVENOUS
  Filled 2018-05-16: qty 2

## 2018-05-16 MED ORDER — SODIUM CHLORIDE 0.9% FLUSH: 3.0000 mL | INTRAVENOUS | Status: DC | PRN

## 2018-05-16 MED ORDER — SODIUM CHLORIDE 0.9% FLUSH
3.0000 mL | Freq: Two times a day (BID) | INTRAVENOUS | Status: DC
  Administered 2018-05-16 – 2018-05-18 (×2): 3 mL via INTRAVENOUS

## 2018-05-16 MED ORDER — ONDANSETRON HCL 4 MG PO TABS
4.0000 mg | ORAL_TABLET | Freq: Four times a day (QID) | ORAL | Status: DC | PRN
  Administered 2018-05-17: 4 mg via ORAL
  Filled 2018-05-16: qty 1

## 2018-05-16 MED ORDER — PANTOPRAZOLE SODIUM 40 MG PO TBEC
40.0000 mg | DELAYED_RELEASE_TABLET | Freq: Every day | ORAL | Status: DC
  Administered 2018-05-16 – 2018-05-18 (×3): 40 mg via ORAL
  Filled 2018-05-16 (×3): qty 1

## 2018-05-16 MED ORDER — MAGNESIUM SULFATE 2 GM/50ML IV SOLN
2.0000 g | Freq: Once | INTRAVENOUS | Status: AC
  Administered 2018-05-16: 2 g via INTRAVENOUS
  Filled 2018-05-16: qty 50

## 2018-05-16 MED ORDER — POLYETHYLENE GLYCOL 3350 17 G PO PACK
17.0000 g | PACK | Freq: Every day | ORAL | Status: DC
  Administered 2018-05-17 – 2018-05-18 (×2): 17 g via ORAL
  Filled 2018-05-16 (×3): qty 1

## 2018-05-16 MED ORDER — MIDODRINE HCL 5 MG PO TABS
5.0000 mg | ORAL_TABLET | Freq: Three times a day (TID) | ORAL | Status: DC
  Administered 2018-05-17 – 2018-05-18 (×4): 5 mg via ORAL
  Filled 2018-05-16 (×5): qty 1

## 2018-05-16 MED ORDER — RISAQUAD PO CAPS
1.0000 | ORAL_CAPSULE | Freq: Two times a day (BID) | ORAL | Status: DC
  Administered 2018-05-16 – 2018-05-18 (×4): 1 via ORAL
  Filled 2018-05-16 (×5): qty 1

## 2018-05-16 MED ORDER — ADULT MULTIVITAMIN W/MINERALS CH
1.0000 | ORAL_TABLET | Freq: Every day | ORAL | Status: DC
  Administered 2018-05-16 – 2018-05-18 (×3): 1 via ORAL
  Filled 2018-05-16 (×3): qty 1

## 2018-05-16 MED ORDER — POTASSIUM CHLORIDE CRYS ER 20 MEQ PO TBCR
40.0000 meq | EXTENDED_RELEASE_TABLET | Freq: Once | ORAL | Status: AC
  Administered 2018-05-16: 40 meq via ORAL
  Filled 2018-05-16: qty 2

## 2018-05-16 MED ORDER — RIVAROXABAN 20 MG PO TABS
20.0000 mg | ORAL_TABLET | Freq: Every evening | ORAL | Status: DC
  Administered 2018-05-16 – 2018-05-17 (×2): 20 mg via ORAL
  Filled 2018-05-16 (×2): qty 1

## 2018-05-16 MED ORDER — LEVOCETIRIZINE DIHYDROCHLORIDE 5 MG PO TABS: 5.0000 mg | ORAL_TABLET | Freq: Every evening | ORAL | Status: DC

## 2018-05-16 MED ORDER — NITROGLYCERIN 0.4 MG SL SUBL: 0.4000 mg | SUBLINGUAL_TABLET | SUBLINGUAL | Status: DC | PRN

## 2018-05-16 MED ORDER — GUAIFENESIN 100 MG/5ML PO SOLN: 5.0000 mL | Freq: Three times a day (TID) | ORAL | Status: DC | PRN

## 2018-05-16 MED ORDER — FLUTICASONE PROPIONATE 50 MCG/ACT NA SUSP
2.0000 | Freq: Every day | NASAL | Status: DC
  Administered 2018-05-18: 2 via NASAL
  Filled 2018-05-16: qty 16

## 2018-05-16 MED ORDER — VILAZODONE HCL 20 MG PO TABS
40.0000 mg | ORAL_TABLET | Freq: Every day | ORAL | Status: DC
  Administered 2018-05-16 – 2018-05-18 (×3): 40 mg via ORAL
  Filled 2018-05-16 (×5): qty 2

## 2018-05-16 MED ORDER — FUROSEMIDE 10 MG/ML IJ SOLN
60.0000 mg | Freq: Two times a day (BID) | INTRAMUSCULAR | Status: DC
  Administered 2018-05-16 – 2018-05-18 (×4): 60 mg via INTRAVENOUS
  Filled 2018-05-16 (×4): qty 6

## 2018-05-16 MED ORDER — TIZANIDINE HCL 4 MG PO TABS
4.0000 mg | ORAL_TABLET | Freq: Three times a day (TID) | ORAL | Status: DC | PRN
  Administered 2018-05-16: 4 mg via ORAL
  Filled 2018-05-16: qty 1

## 2018-05-16 MED ORDER — OXYCODONE HCL 5 MG PO TABS
5.0000 mg | ORAL_TABLET | Freq: Four times a day (QID) | ORAL | Status: DC | PRN
  Administered 2018-05-16 – 2018-05-17 (×3): 5 mg via ORAL
  Filled 2018-05-16 (×3): qty 1

## 2018-05-16 MED ORDER — CARVEDILOL 3.125 MG PO TABS
3.1250 mg | ORAL_TABLET | Freq: Two times a day (BID) | ORAL | Status: DC
  Administered 2018-05-16 – 2018-05-18 (×5): 3.125 mg via ORAL
  Filled 2018-05-16 (×5): qty 1

## 2018-05-16 MED ORDER — INSULIN ASPART 100 UNIT/ML ~~LOC~~ SOLN: 0.0000 [IU] | Freq: Every day | SUBCUTANEOUS | Status: DC

## 2018-05-16 MED ORDER — SIMVASTATIN 20 MG PO TABS
20.0000 mg | ORAL_TABLET | Freq: Every day | ORAL | Status: DC
  Administered 2018-05-16 – 2018-05-17 (×2): 20 mg via ORAL
  Filled 2018-05-16 (×2): qty 1

## 2018-05-16 MED ORDER — SODIUM CHLORIDE 0.9 % IV SOLN: 250.0000 mL | INTRAVENOUS | Status: DC | PRN

## 2018-05-16 MED ORDER — ACETAMINOPHEN 325 MG PO TABS: 650.0000 mg | ORAL_TABLET | ORAL | Status: DC | PRN

## 2018-05-16 NOTE — ED Notes (Signed)
CRITICAL VALUE ALERT  Critical Value:  Troponin 0.04  Date & Time Notied:  05/16/2018 @ 1040  Provider Notified: Dr Clarene Duke  Orders Received/Actions taken: orders to be given

## 2018-05-16 NOTE — ED Triage Notes (Signed)
Pt from [facility].  Was getting out of bed and foot got hung in w/c.  Injury to right shoulder, right elbow and right hip.  Rates pain 5/10 to right shoulder. History of right hip surgery January 16, 2018.  Denies hitting head.
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
## 2018-05-17 ENCOUNTER — Observation Stay (HOSPITAL_BASED_OUTPATIENT_CLINIC_OR_DEPARTMENT_OTHER): Payer: Medicare Other

## 2018-05-17 DIAGNOSIS — E876 Hypokalemia: Secondary | ICD-10-CM | POA: Diagnosis present

## 2018-05-17 DIAGNOSIS — Z96641 Presence of right artificial hip joint: Secondary | ICD-10-CM | POA: Diagnosis present

## 2018-05-17 DIAGNOSIS — R0902 Hypoxemia: Secondary | ICD-10-CM | POA: Diagnosis present

## 2018-05-17 DIAGNOSIS — E1143 Type 2 diabetes mellitus with diabetic autonomic (poly)neuropathy: Secondary | ICD-10-CM | POA: Diagnosis present

## 2018-05-17 DIAGNOSIS — E785 Hyperlipidemia, unspecified: Secondary | ICD-10-CM | POA: Diagnosis present

## 2018-05-17 DIAGNOSIS — Z7901 Long term (current) use of anticoagulants: Secondary | ICD-10-CM | POA: Diagnosis not present

## 2018-05-17 DIAGNOSIS — M79601 Pain in right arm: Secondary | ICD-10-CM | POA: Diagnosis present

## 2018-05-17 DIAGNOSIS — I509 Heart failure, unspecified: Secondary | ICD-10-CM

## 2018-05-17 DIAGNOSIS — I5023 Acute on chronic systolic (congestive) heart failure: Secondary | ICD-10-CM | POA: Diagnosis not present

## 2018-05-17 DIAGNOSIS — G4730 Sleep apnea, unspecified: Secondary | ICD-10-CM | POA: Diagnosis present

## 2018-05-17 DIAGNOSIS — I428 Other cardiomyopathies: Secondary | ICD-10-CM | POA: Diagnosis present

## 2018-05-17 DIAGNOSIS — Z8249 Family history of ischemic heart disease and other diseases of the circulatory system: Secondary | ICD-10-CM | POA: Diagnosis not present

## 2018-05-17 DIAGNOSIS — F419 Anxiety disorder, unspecified: Secondary | ICD-10-CM | POA: Diagnosis present

## 2018-05-17 DIAGNOSIS — I2510 Atherosclerotic heart disease of native coronary artery without angina pectoris: Secondary | ICD-10-CM | POA: Diagnosis present

## 2018-05-17 DIAGNOSIS — Z743 Need for continuous supervision: Secondary | ICD-10-CM | POA: Diagnosis not present

## 2018-05-17 DIAGNOSIS — I447 Left bundle-branch block, unspecified: Secondary | ICD-10-CM | POA: Diagnosis present

## 2018-05-17 DIAGNOSIS — I361 Nonrheumatic tricuspid (valve) insufficiency: Secondary | ICD-10-CM

## 2018-05-17 DIAGNOSIS — J449 Chronic obstructive pulmonary disease, unspecified: Secondary | ICD-10-CM | POA: Diagnosis present

## 2018-05-17 DIAGNOSIS — R404 Transient alteration of awareness: Secondary | ICD-10-CM | POA: Diagnosis not present

## 2018-05-17 DIAGNOSIS — I482 Chronic atrial fibrillation, unspecified: Secondary | ICD-10-CM | POA: Diagnosis present

## 2018-05-17 DIAGNOSIS — Z6836 Body mass index (BMI) 36.0-36.9, adult: Secondary | ICD-10-CM | POA: Diagnosis not present

## 2018-05-17 DIAGNOSIS — Z953 Presence of xenogenic heart valve: Secondary | ICD-10-CM | POA: Diagnosis not present

## 2018-05-17 DIAGNOSIS — Z794 Long term (current) use of insulin: Secondary | ICD-10-CM | POA: Diagnosis not present

## 2018-05-17 DIAGNOSIS — K3184 Gastroparesis: Secondary | ICD-10-CM | POA: Diagnosis present

## 2018-05-17 DIAGNOSIS — I110 Hypertensive heart disease with heart failure: Secondary | ICD-10-CM | POA: Diagnosis present

## 2018-05-17 DIAGNOSIS — M25551 Pain in right hip: Secondary | ICD-10-CM | POA: Diagnosis present

## 2018-05-17 DIAGNOSIS — Z79899 Other long term (current) drug therapy: Secondary | ICD-10-CM | POA: Diagnosis not present

## 2018-05-17 DIAGNOSIS — Y92122 Bedroom in nursing home as the place of occurrence of the external cause: Secondary | ICD-10-CM | POA: Diagnosis not present

## 2018-05-17 DIAGNOSIS — W010XXA Fall on same level from slipping, tripping and stumbling without subsequent striking against object, initial encounter: Secondary | ICD-10-CM | POA: Diagnosis present

## 2018-05-17 LAB — ECHOCARDIOGRAM COMPLETE
Height: 69 in
Weight: 3996.5 oz

## 2018-05-17 LAB — BASIC METABOLIC PANEL
Anion gap: 12 (ref 5–15)
BUN: 9 mg/dL (ref 8–23)
CO2: 40 mmol/L — ABNORMAL HIGH (ref 22–32)
Calcium: 8.8 mg/dL — ABNORMAL LOW (ref 8.9–10.3)
Chloride: 94 mmol/L — ABNORMAL LOW (ref 98–111)
Creatinine, Ser: 0.93 mg/dL (ref 0.61–1.24)
GFR calc Af Amer: >60 mL/min (ref >60)
GFR calc non Af Amer: >60 mL/min (ref >60)
Glucose, Bld: 212 mg/dL — ABNORMAL HIGH (ref 70–99)
Potassium: 3.5 mmol/L (ref 3.5–5.1)
Sodium: 146 mmol/L — ABNORMAL HIGH (ref 135–145)

## 2018-05-17 LAB — GLUCOSE, CAPILLARY
Glucose-Capillary: 178 mg/dL — ABNORMAL HIGH (ref 70–99)
Glucose-Capillary: 140 mg/dL — ABNORMAL HIGH (ref 70–99)
Glucose-Capillary: 111 mg/dL — ABNORMAL HIGH (ref 70–99)
Glucose-Capillary: 154 mg/dL — ABNORMAL HIGH (ref 70–99)

## 2018-05-17 LAB — PROCALCITONIN: Procalcitonin: <0.1 ng/mL

## 2018-05-17 MED ORDER — PERFLUTREN LIPID MICROSPHERE
1.0000 mL | INTRAVENOUS | Status: AC | PRN
  Administered 2018-05-17: 2 mL via INTRAVENOUS
  Filled 2018-05-17: qty 10

## 2018-05-17 MED ORDER — POTASSIUM CHLORIDE CRYS ER 20 MEQ PO TBCR
40.0000 meq | EXTENDED_RELEASE_TABLET | Freq: Once | ORAL | Status: AC
  Administered 2018-05-17: 40 meq via ORAL
  Filled 2018-05-17: qty 2

## 2018-05-17 MED ORDER — LORATADINE 10 MG PO TABS
10.0000 mg | ORAL_TABLET | Freq: Every day | ORAL | Status: DC
  Administered 2018-05-17: 10 mg via ORAL
  Filled 2018-05-17: qty 1

## 2018-05-17 NOTE — Progress Notes (Signed)
PROGRESS NOTE    Cody Hale  ZOX:[id number] DOB: 1950/10/27 DOA: 05/16/2018 PCP: [name], MD   Brief Narrative:  67 y.o. 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   Assessment & Plan:   Principal Problem:   Acute on chronic systolic CHF (congestive heart failure) (HCC) Active Problems:   LBBB (left bundle branch block)   Gastroparesis due to DM [facility])   Essential hypertension, malignant   Chronic atrial fibrillation   S/P mitral valve replacement with bioprosthetic valve   Nonischemic cardiomyopathy (HCC)   Acute on chronic systolic (congestive) heart failure (HCC)    #)Bilateral pleural effusion/acute on chronic nonischemic systolic CHF: Improving with IV diuresis.  His oxygen requirement is decreasing.  His procalcitonin is undetectable suggesting pneumonia is unlikely. -Strict ins and outs -Fluid restriction -IV furosemide 60 mg twice daily -Hold home oral torsemide -Continue carvedilol 3.125 mg twice daily -Unclear why patient is not on ACE inhibitor however will hold for this time.  #)Hypokalemia: Resolved  #)Elevated troponin: Likely secondary to CHF exacerbation.  #) Type 2 diabetes: - Sliding scale insulin, AC at bedtime  #) Chronic atrial fibrillation/left bundle branch block: -Continue beta-blocker -Continue rivaroxaban 20 mg daily  #) Hypertension/hyperlipidemia: - Continue beta-blocker -Continue simvastatin 20 mg every morning  -continue midodrine 5 mg 3 times daily  #) Diabetic gastroparesis: -Continue medical provide 5 mg every 6  hours -Continue PPI  #) Pain/psych: - Continue vilazadone -Continue tizanidine as needed  Fluids: Restricted Elect lites: Monitor and supplement Nutrition: Heart healthy/fluid restricted diet/carb restricted  Prophylaxis: On rivaroxaban  Disposition: Pending resolution of hypoxia  Full code        Consultants:   None  Procedures:  05/17/2018 echo: Pending  Antimicrobials:   None   Subjective: This morning patient reports he is feeling fairly well.  He he denies any nausea, vomiting, diarrhea.  Reports his congestion and cough is improved somewhat.  Objective: Vitals:   05/16/18 1434 05/16/18 1445 05/16/18 2230 05/17/18 0623  BP:  (!) 151/72 (!) 166/85 123/60  Pulse:  84 88 (!) 102  Resp:  17  20  Temp:  98.2 F (36.8 C) 97.8 F (36.6 C) 98.4 F (36.9 C)  TempSrc:  Oral Oral Oral  SpO2: 91% 94% (!) 85% 92%  Weight:    113.3 kg  Height:        Intake/Output Summary (Last 24 hours) at 05/17/2018 1051 Last data filed at 05/17/2018 0933 Gross per 24 hour  Intake 530 ml  Output 600 ml  Net -70 ml   Filed Weights   05/16/18 0718 05/17/18 0623  Weight: 119.3 kg 113.3 kg    Examination:  General exam: Appears calm and comfortable  Respiratory system: No increased work of breathing, diminished lung sounds at bases, no wheezes, crackles, rhonchi Cardiovascular system: Irregularly irregular, no murmurs Gastrointestinal system: Abdomen is nondistended, soft and nontender. No organomegaly or masses felt. Normal bowel sounds heard. Central nervous system: Alert and oriented.  Grossly intact, moving all extremities Extremities: Lower extremities wrapped but diminished pedal edema noted r. Skin: Lower extremity is wrapped Psychiatry: Judgement and insight appear normal. Mood & affect appropriate.  Data Reviewed: I have personally reviewed following labs and imaging studies  CBC: Recent Labs  Lab 05/16/18 0956  WBC 5.9  NEUTROABS 4.5   HGB 13.1  HCT 42.5  MCV 93.4  PLT 216   Basic Metabolic Panel: Recent Labs  Lab 05/16/18 0956 05/17/18 0702  NA 143 146*  K 2.8* 3.5  CL 93* 94*  CO2 41* 40*  GLUCOSE 176* 212*  BUN 7* 9  CREATININE 0.78 0.93  CALCIUM 8.3* 8.8*  MG 1.7  --    GFR: Estimated Creatinine Clearance: 95.6 mL/min (by C-G formula based on SCr of 0.93 mg/dL). Liver Function Tests: No results for input(s): AST, ALT, ALKPHOS, BILITOT, PROT, ALBUMIN in the last 168 hours. No results for input(s): LIPASE, AMYLASE in the last 168 hours. No results for input(s): AMMONIA in the last 168 hours. Coagulation Profile: No results for input(s): INR, PROTIME in the last 168 hours. Cardiac Enzymes: Recent Labs  Lab 05/16/18 0956  TROPONINI 0.04*   BNP (last 3 results) No results for input(s): PROBNP in the last 8760 hours. HbA1C: No results for input(s): HGBA1C in the last 72 hours. CBG: Recent Labs  Lab 05/16/18 1649 05/16/18 2227 05/17/18 0740  GLUCAP 149* 188* 178*   Lipid Profile: No results for input(s): CHOL, HDL, LDLCALC, TRIG, CHOLHDL, LDLDIRECT in the last 72 hours. Thyroid Function Tests: No results for input(s): TSH, T4TOTAL, FREET4, T3FREE, THYROIDAB in the last 72 hours. Anemia Panel: No results for input(s): VITAMINB12, FOLATE, FERRITIN, TIBC, IRON, RETICCTPCT in the last 72 hours. Sepsis Labs: Recent Labs  Lab 05/16/18 1109 05/16/18 1320 05/16/18 1553 05/17/18 0702  PROCALCITON  --   --  <0.10 <0.10  LATICACIDVEN 1.1 1.2  --   --     No results found for this or any previous visit (from the past 240 hour(s)).       Radiology Studies: Dg Chest 2 View  Result Date: 05/16/2018 CLINICAL DATA:  Fall this morning. EXAM: CHEST - 2 VIEW COMPARISON:  04/17/2018 FINDINGS: Stable enlargement of the cardiac silhouette with median sternotomy wires. Persistent densities at the left lung base compatible with pleural fluid and atelectasis. Increased densities at the right lung base  and suggestive for pleural fluid with atelectasis or airspace disease. Negative for a pneumothorax. No acute bone abnormality. IMPRESSION: Bibasilar chest densities compatible with bilateral pleural effusions with atelectasis or airspace disease. Basilar chest densities have progressed, particularly on the right side compared to 04/17/2018. Electronically Signed   By: Richarda Overlie M.D.   On: 05/16/2018 09:28   Dg Shoulder Right  Result Date: 05/16/2018 CLINICAL DATA:  Fall this morning with right shoulder pain. EXAM: RIGHT SHOULDER - 2+ VIEW COMPARISON:  Chest x-ray 04/12/2018 FINDINGS: Mild degenerative change of the [facility] joint and glenohumeral joints. No evidence of acute fracture or dislocation involving the shoulder. Several old right lateral rib fractures. IMPRESSION: No acute shoulder injury. Old right rib fractures. Electronically Signed   By: Elberta Fortis M.D.   On: 05/16/2018 08:25   Dg Elbow Complete Right  Result Date: 05/16/2018 CLINICAL DATA:  Fall this morning with right elbow pain. EXAM: RIGHT ELBOW - COMPLETE 3+ VIEW COMPARISON:  None. FINDINGS: Mild degenerate change of the right elbow joint. No evidence of acute fracture or dislocation. IMPRESSION: No acute findings. Electronically Signed   By: Elberta Fortis M.D.   On: 05/16/2018 08:26   Dg Wrist Complete Right  Result Date: 05/16/2018 CLINICAL DATA:  Fall this morning with right wrist  pain. EXAM: RIGHT WRIST - COMPLETE 3+ VIEW COMPARISON:  None. FINDINGS: Mild degenerate changes over the radiocarpal joint and carpal bones as well as first carpometacarpal joint and first MCP joints. No evidence of acute fracture or dislocation. IMPRESSION: No acute findings. Electronically Signed   By: Elberta Fortis M.D.   On: 05/16/2018 08:27   Ct Head Wo Contrast  Result Date: 05/16/2018 CLINICAL DATA:  Fall transitioning from bed to wheelchair. Head trauma. EXAM: CT HEAD WITHOUT CONTRAST CT CERVICAL SPINE WITHOUT CONTRAST TECHNIQUE: Multidetector CT  imaging of the head and cervical spine was performed following the standard protocol without intravenous contrast. Multiplanar CT image reconstructions of the cervical spine were also generated. COMPARISON:  03/12/2015 FINDINGS: CT HEAD FINDINGS Brain: 4 mm colloid cyst on image 14/3 without ventricular dilatation. Otherwise, the brainstem, cerebellum, cerebral peduncles, thalami, basal ganglia, basilar cisterns, and ventricular system appear within normal limits. No intracranial hemorrhage, mass lesion, or acute CVA. Vascular: Atherosclerotic calcification of the left vertebral artery and of the cavernous carotid arteries. Skull: Unremarkable Sinuses/Orbits: Unremarkable Other: No supplemental non-categorized findings. CT CERVICAL SPINE FINDINGS Alignment: No vertebral subluxation is observed. Skull base and vertebrae: Extensive spur formation in the cervical spine with continuous bridging spurring at C4-C5-C6-C7-T1, and non continuous spurring anteriorly at C2-3 and C3-4 with chronic fragmentation. There is bridging spurring of the left C4-5 facet joint. No fracture observed. Soft tissues and spinal canal: Atherosclerotic calcification of the common carotid arteries. Disc levels: Severe central narrowing of the thecal sac at C3-4 due to posterior intervertebral spurring as on image 32/9, with resulting prominent central narrowing of the thecal sac Right foraminal impingement due to spurring at C2-3 and possibly C4-5. Upper chest: Suspected pleural effusions of the lung apices. Other: No supplemental non-categorized findings. IMPRESSION: 1. No acute intracranial findings or acute cervical spine findings. 2. Suspected bilateral pleural effusions, dedicated chest radiography recommended. 3. 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 4. Diffuse idiopathic skeletal hyperostosis with prominent spurring in the cervical spine. There is severe  central narrowing of the thecal sac at C3-4 due to a large posterior intervertebral spur, as well as suspected right foraminal impingement at C2-3 and C4-5. 5. Atherosclerosis. Electronically Signed   By: Gaylyn Rong M.D.   On: 05/16/2018 08:47   Ct Cervical Spine Wo Contrast  Result Date: 05/16/2018 CLINICAL DATA:  Fall transitioning from bed to wheelchair. Head trauma. EXAM: CT HEAD WITHOUT CONTRAST CT CERVICAL SPINE WITHOUT CONTRAST TECHNIQUE: Multidetector CT imaging of the head and cervical spine was performed following the standard protocol without intravenous contrast. Multiplanar CT image reconstructions of the cervical spine were also generated. COMPARISON:  03/12/2015 FINDINGS: CT HEAD FINDINGS Brain: 4 mm colloid cyst on image 14/3 without ventricular dilatation. Otherwise, the brainstem, cerebellum, cerebral peduncles, thalami, basal ganglia, basilar cisterns, and ventricular system appear within normal limits. No intracranial hemorrhage, mass lesion, or acute CVA. Vascular: Atherosclerotic calcification of the left vertebral artery and of the cavernous carotid arteries. Skull: Unremarkable Sinuses/Orbits: Unremarkable Other: No supplemental non-categorized findings. CT CERVICAL SPINE FINDINGS Alignment: No vertebral subluxation is observed. Skull base and vertebrae: Extensive spur formation in the cervical spine with continuous bridging spurring at C4-C5-C6-C7-T1, and non continuous spurring anteriorly at C2-3 and C3-4 with chronic fragmentation. There is bridging spurring of the left C4-5 facet joint. No fracture observed. Soft tissues and spinal canal: Atherosclerotic calcification of the common carotid arteries. Disc levels: Severe central narrowing of the  thecal sac at C3-4 due to posterior intervertebral spurring as on image 32/9, with resulting prominent central narrowing of the thecal sac Right foraminal impingement due to spurring at C2-3 and possibly C4-5. Upper chest: Suspected  pleural effusions of the lung apices. Other: No supplemental non-categorized findings. IMPRESSION: 1. No acute intracranial findings or acute cervical spine findings. 2. Suspected bilateral pleural effusions, dedicated chest radiography recommended. 3. 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 4. Diffuse idiopathic skeletal hyperostosis with prominent spurring in the cervical spine. There is severe central narrowing of the thecal sac at C3-4 due to a large posterior intervertebral spur, as well as suspected right foraminal impingement at C2-3 and C4-5. 5. Atherosclerosis. Electronically Signed   By: Gaylyn Rong M.D.   On: 05/16/2018 08:47   Dg Knee Complete 4 Views Right  Result Date: 05/16/2018 CLINICAL DATA:  Fall this morning with right knee pain EXAM: RIGHT KNEE - COMPLETE 4+ VIEW COMPARISON:  None. FINDINGS: Mild tricompartmental osteoarthritic changes present no acute fracture or dislocation. No significant joint effusion. IMPRESSION: No acute fracture. Mild osteoarthritis. Electronically Signed   By: Elberta Fortis M.D.   On: 05/16/2018 08:23   Dg Hip Unilat With Pelvis 2-3 Views Right  Result Date: 05/16/2018 CLINICAL DATA:  Fall this morning with right hip pain. EXAM: DG HIP (WITH OR WITHOUT PELVIS) 2-3V RIGHT COMPARISON:  01/16/2018 FINDINGS: Right total hip arthroplasty intact and normally located. No evidence of acute fracture or dislocation. Degenerative change of the left hip. Degenerative change of the spine. Mild diffuse osteopenia. IMPRESSION: No acute findings. 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 Electronically Signed   By: Elberta Fortis M.D.   On: 05/16/2018 08:22        Scheduled Meds: . acidophilus  1 capsule Oral BID  . carvedilol  3.125 mg Oral BID  . fluticasone  2 spray Each Nare Daily  . furosemide  60 mg Intravenous BID  . insulin aspart  0-5 Units Subcutaneous QHS  .  insulin aspart  0-9 Units Subcutaneous TID WC  . loratadine  10 mg Oral QHS  . metoCLOPramide  5 mg Oral TID AC & HS  . midodrine  5 mg Oral TID  . multivitamin with minerals  1 tablet Oral Daily  . pantoprazole  40 mg Oral Daily  . polyethylene glycol  17 g Oral Daily  . rivaroxaban  20 mg Oral QPM  . simvastatin  20 mg Oral q1800  . sodium chloride flush  3 mL Intravenous Q12H  . Vilazodone HCl  40 mg Oral Daily   Continuous Infusions: . sodium chloride       LOS: 0 days    Time spent: 35    Delaine Lame, MD [facility]  If 7PM-7AM, please contact night-coverage [email or web address] Password [email or web address] 05/17/2018, 10:51 AM

## 2018-05-17 NOTE — Progress Notes (Signed)
*  PRELIMINARY RESULTS* Echocardiogram 2D Echocardiogram with definity has been performed.  Jeryl Columbia 05/17/2018, 9:09 AM

## 2018-05-18 ENCOUNTER — Ambulatory Visit: Payer: Medicare Other | Admitting: Gastroenterology

## 2018-05-18 LAB — BASIC METABOLIC PANEL
Anion gap: 11 (ref 5–15)
BUN: 13 mg/dL (ref 8–23)
CO2: 40 mmol/L — ABNORMAL HIGH (ref 22–32)
Calcium: 8.8 mg/dL — ABNORMAL LOW (ref 8.9–10.3)
Chloride: 94 mmol/L — ABNORMAL LOW (ref 98–111)
Creatinine, Ser: 1.26 mg/dL — ABNORMAL HIGH (ref 0.61–1.24)
GFR calc Af Amer: >60 mL/min (ref >60)
GFR calc non Af Amer: 57 mL/min — ABNORMAL LOW (ref >60)
Glucose, Bld: 168 mg/dL — ABNORMAL HIGH (ref 70–99)
Potassium: 3.8 mmol/L (ref 3.5–5.1)
Sodium: 145 mmol/L (ref 135–145)

## 2018-05-18 LAB — MAGNESIUM: Magnesium: 2.1 mg/dL (ref 1.7–2.4)

## 2018-05-18 LAB — PROCALCITONIN: Procalcitonin: <0.1 ng/mL

## 2018-05-18 LAB — GLUCOSE, CAPILLARY
Glucose-Capillary: 157 mg/dL — ABNORMAL HIGH (ref 70–99)
Glucose-Capillary: 168 mg/dL — ABNORMAL HIGH (ref 70–99)

## 2018-05-18 MED ORDER — TORSEMIDE 20 MG PO TABS: 20.0000 mg | ORAL_TABLET | Freq: Two times a day (BID) | ORAL | 1 refills | Status: DC

## 2018-05-18 NOTE — Progress Notes (Signed)
Oxygen removed from patient and saturation dropped to 50%, O2 at 3 liters reapplied and patient's oxygen level increased to 90%.

## 2018-05-18 NOTE — Discharge Summary (Addendum)
Physician Discharge Summary  Cody Hale ZOX:[id number] DOB: 1951-05-13 DOA: 05/16/2018  PCP: [name], MD  Admit date: 05/16/2018 Discharge date: 05/18/2018  Admitted From: ALF Disposition:  ALF  Recommendations for Outpatient Follow-up:  1. Follow up with PCP in 1-2 weeks 2. Please obtain BMP/CBC in one week 3. Your torsemide was increased to twice a day.  Please take 40 mg twice a day if your weight goes up by more than 4 pounds, you notice increased swelling in your legs, you are short of breath lying down flat.  Please take twice a day for 2 to 3 days and call your primary care doctor.  Home Health: Yes Equipment/Devices: home oxygen  Discharge Condition: stable CODE STATUS: FULL Diet recommendation: Heart Healthy / Carb Modified sodium restricted diet  Brief/Interim Summary:  #) Acute on chronic systolic heart failure: Patient was admitted with a fall but noted to have mild hypoxia and bilateral pleural effusions and pulmonary edema.  It was felt the patient had congestive heart failure exacerbation.  Patient was given IV diuresis for 2 days with improvement in hypoxia and cough.  Patient also noted to have improved congestion.  Patient was discharged with his increase in torsemide to 20 mg twice daily with instructions to double up if he noticed weight gain, lower extremity swelling, orthopnea.  A repeat echo was performed on 05/17/2018 that showed his ejection fraction improved to 40 to 45%.  Patient's troponins were flat on admission.  #) Falls: Patient was admitted after mechanical fall.  Extensive imaging during admission was unremarkable.  #) Hypokalemia: This resolved with replacement.  #) Type 2 diabetes: Patient was maintained on sliding scale insulin here.  #) Chronic atrial fibrillation/left bundle branch block: Patient was continued on home beta-blocker and rivaroxaban.  #) Endocarditis status post bioprosthetic mitral valve: Patient was continued on  rivaroxaban.  #) Diabetic gastroparesis: Patient was continued on metoclopramide every 6 hours.  #) Pain/psych: Patient was continued on home vilazadone and tizanidine.  Discharge Diagnoses:  Principal Problem:   Acute on chronic systolic CHF (congestive heart failure) (HCC) Active Problems:   LBBB (left bundle branch block)   Gastroparesis due to DM [facility])   Essential hypertension, malignant   Chronic atrial fibrillation   S/P mitral valve replacement with bioprosthetic valve   Nonischemic cardiomyopathy (HCC)   Acute on chronic systolic (congestive) heart failure (HCC)   CHF (congestive heart failure) [facility])    Discharge Instructions  Discharge Instructions    Call MD for:  difficulty breathing, headache or visual disturbances   Complete by:  As directed    Call MD for:  hives   Complete by:  As directed    Call MD for:  persistant dizziness or light-headedness   Complete by:  As directed    Call MD for:  persistant nausea and vomiting   Complete by:  As directed    Call MD for:  redness, tenderness, or signs of infection (pain, swelling, redness, odor or green/yellow discharge around incision site)   Complete by:  As directed    Call MD for:  severe uncontrolled pain   Complete by:  As directed    Call MD for:  temperature >100.4   Complete by:  As directed    Diet - low sodium heart healthy   Complete by:  As directed    Discharge instructions   Complete by:  As directed    Please increase your torsemide to twice a day.  Please weigh  yourself every day and taken double dose of the torsemide twice a day for 2 to 3 days if your weight goes up more by 5 pounds, he noticed increased swelling, you are short of breath lying down flat.   For home use only DME oxygen   Complete by:  As directed    Mode or (Route):  Nasal cannula   Liters per Minute:  3   Frequency:  Continuous (stationary and portable oxygen unit needed)   Oxygen conserving device:  Yes   Oxygen delivery  system:  Gas   Increase activity slowly   Complete by:  As directed      Allergies as of 05/18/2018      Reactions   Daptomycin Rash   Lisinopril Cough   Tape Rash, Other (See Comments)   Adhesive Tape-Burn skin.      Medication List    TAKE these medications   carvedilol 3.125 MG tablet Commonly known as:  COREG Take 1 tablet (3.125 mg total) by mouth 2 (two) times daily.   fluticasone 50 MCG/ACT nasal spray Commonly known as:  FLONASE Place 2 sprays into both nostrils daily.   guaiFENesin 200 MG/10ML Soln Take 5 mLs by mouth 3 (three) times daily as needed (cough).   levocetirizine 5 MG tablet Commonly known as:  XYZAL Take 1 tablet (5 mg total) by mouth every evening.   metoCLOPramide 5 MG tablet Commonly known as:  REGLAN Take 1 tablet (5 mg total) by mouth 4 (four) times daily.   midodrine 10 MG tablet Commonly known as:  PROAMATINE Take 0.5 tablets (5 mg total) by mouth 3 (three) times daily.   multivitamin tablet Take 1 tablet by mouth daily.   nitroGLYCERIN 0.4 MG SL tablet Commonly known as:  NITROSTAT Place 1 tablet (0.4 mg total) under the tongue every 5 (five) minutes as needed for chest pain. Max 3 doses.   NOVOFINE 30G X 8 MM Misc Generic drug:  Insulin Pen Needle Inject 1 packet into the skin as needed.   ondansetron 4 MG tablet Commonly known as:  ZOFRAN Take 4 mg by mouth every 6 (six) hours as needed for nausea or vomiting.   oxyCODONE 5 MG immediate release tablet Commonly known as:  Oxy IR/ROXICODONE Take 5 mg by mouth every 6 (six) hours as needed for severe pain.   pantoprazole 40 MG tablet Commonly known as:  PROTONIX Take 1 tablet (40 mg total) by mouth daily.   polyethylene glycol packet Commonly known as:  MIRALAX / GLYCOLAX Take 17 g by mouth daily.   RISA-BID PROBIOTIC Tabs Take 1 tablet by mouth 2 (two) times daily.   simvastatin 20 MG tablet Commonly known as:  ZOCOR TAKE 1 TABLET BY MOUTH ONCE DAILY. What changed:   when to take this   tiZANidine 4 MG tablet Commonly known as:  ZANAFLEX Take 1 tablet (4 mg total) by mouth every 8 (eight) hours as needed for muscle spasms.   torsemide 20 MG tablet Commonly known as:  DEMADEX Take 1 tablet (20 mg total) by mouth 2 (two) times daily. What changed:  when to take this   VIIBRYD 40 MG Tabs Generic drug:  Vilazodone HCl TAKE 1 TABLET BY MOUTH ONCE DAILY. What changed:  how much to take   XARELTO 20 MG Tabs tablet Generic drug:  rivaroxaban TAKE 1 TABLET BY MOUTH ONCE DAILY. What changed:  how much to take            Durable  Medical Equipment  (From admission, onward)         Start     Ordered   05/18/18 0000  For home use only DME oxygen    Question Answer Comment  Mode or (Route) Nasal cannula   Liters per Minute 3   Frequency Continuous (stationary and portable oxygen unit needed)   Oxygen conserving device Yes   Oxygen delivery system Gas      05/18/18 1130          Allergies  Allergen Reactions  . Daptomycin Rash  . Lisinopril Cough  . Tape Rash and Other (See Comments)    Adhesive Tape-Burn skin.    Consultations:  None   Procedures/Studies: Dg Chest 2 View  Result Date: 05/16/2018 CLINICAL DATA:  Fall this morning. EXAM: CHEST - 2 VIEW COMPARISON:  04/17/2018 FINDINGS: Stable enlargement of the cardiac silhouette with median sternotomy wires. Persistent densities at the left lung base compatible with pleural fluid and atelectasis. Increased densities at the right lung base and suggestive for pleural fluid with atelectasis or airspace disease. Negative for a pneumothorax. No acute bone abnormality. IMPRESSION: Bibasilar chest densities compatible with bilateral pleural effusions with atelectasis or airspace disease. Basilar chest densities have progressed, particularly on the right side compared to 04/17/2018. Electronically Signed   By: Richarda Overlie M.D.   On: 05/16/2018 09:28   Dg Shoulder Right  Result Date:  05/16/2018 CLINICAL DATA:  Fall this morning with right shoulder pain. EXAM: RIGHT SHOULDER - 2+ VIEW COMPARISON:  Chest x-ray 04/12/2018 FINDINGS: Mild degenerative change of the [facility] joint and glenohumeral joints. No evidence of acute fracture or dislocation involving the shoulder. Several old right lateral rib fractures. IMPRESSION: No acute shoulder injury. Old right rib fractures. Electronically Signed   By: Elberta Fortis M.D.   On: 05/16/2018 08:25   Dg Elbow Complete Right  Result Date: 05/16/2018 CLINICAL DATA:  Fall this morning with right elbow pain. EXAM: RIGHT ELBOW - COMPLETE 3+ VIEW COMPARISON:  None. FINDINGS: Mild degenerate change of the right elbow joint. No evidence of acute fracture or dislocation. IMPRESSION: No acute findings. Electronically Signed   By: Elberta Fortis M.D.   On: 05/16/2018 08:26   Dg Wrist Complete Right  Result Date: 05/16/2018 CLINICAL DATA:  Fall this morning with right wrist pain. EXAM: RIGHT WRIST - COMPLETE 3+ VIEW COMPARISON:  None. FINDINGS: Mild degenerate changes over the radiocarpal joint and carpal bones as well as first carpometacarpal joint and first MCP joints. No evidence of acute fracture or dislocation. IMPRESSION: No acute findings. Electronically Signed   By: Elberta Fortis M.D.   On: 05/16/2018 08:27   Ct Head Wo Contrast  Result Date: 05/16/2018 CLINICAL DATA:  Fall transitioning from bed to wheelchair. Head trauma. EXAM: CT HEAD WITHOUT CONTRAST CT CERVICAL SPINE WITHOUT CONTRAST TECHNIQUE: Multidetector CT imaging of the head and cervical spine was performed following the standard protocol without intravenous contrast. Multiplanar CT image reconstructions of the cervical spine were also generated. COMPARISON:  03/12/2015 FINDINGS: CT HEAD FINDINGS Brain: 4 mm colloid cyst on image 14/3 without ventricular dilatation. Otherwise, the brainstem, cerebellum, cerebral peduncles, thalami, basal ganglia, basilar cisterns, and ventricular system appear  within normal limits. No intracranial hemorrhage, mass lesion, or acute CVA. Vascular: Atherosclerotic calcification of the left vertebral artery and of the cavernous carotid arteries. Skull: Unremarkable Sinuses/Orbits: Unremarkable Other: No supplemental non-categorized findings. CT CERVICAL SPINE FINDINGS Alignment: No vertebral subluxation is observed. Skull base and vertebrae: Extensive spur  formation in the cervical spine with continuous bridging spurring at C4-C5-C6-C7-T1, and non continuous spurring anteriorly at C2-3 and C3-4 with chronic fragmentation. There is bridging spurring of the left C4-5 facet joint. No fracture observed. Soft tissues and spinal canal: Atherosclerotic calcification of the common carotid arteries. Disc levels: Severe central narrowing of the thecal sac at C3-4 due to posterior intervertebral spurring as on image 32/9, with resulting prominent central narrowing of the thecal sac Right foraminal impingement due to spurring at C2-3 and possibly C4-5. Upper chest: Suspected pleural effusions of the lung apices. Other: No supplemental non-categorized findings. IMPRESSION: 1. No acute intracranial findings or acute cervical spine findings. 2. Suspected bilateral pleural effusions, dedicated chest radiography recommended. 3. 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 4. Diffuse idiopathic skeletal hyperostosis with prominent spurring in the cervical spine. There is severe central narrowing of the thecal sac at C3-4 due to a large posterior intervertebral spur, as well as suspected right foraminal impingement at C2-3 and C4-5. 5. Atherosclerosis. Electronically Signed   By: Gaylyn Rong M.D.   On: 05/16/2018 08:47   Ct Cervical Spine Wo Contrast  Result Date: 05/16/2018 CLINICAL DATA:  Fall transitioning from bed to wheelchair. Head trauma. EXAM: CT HEAD WITHOUT CONTRAST CT CERVICAL SPINE WITHOUT CONTRAST  TECHNIQUE: Multidetector CT imaging of the head and cervical spine was performed following the standard protocol without intravenous contrast. Multiplanar CT image reconstructions of the cervical spine were also generated. COMPARISON:  03/12/2015 FINDINGS: CT HEAD FINDINGS Brain: 4 mm colloid cyst on image 14/3 without ventricular dilatation. Otherwise, the brainstem, cerebellum, cerebral peduncles, thalami, basal ganglia, basilar cisterns, and ventricular system appear within normal limits. No intracranial hemorrhage, mass lesion, or acute CVA. Vascular: Atherosclerotic calcification of the left vertebral artery and of the cavernous carotid arteries. Skull: Unremarkable Sinuses/Orbits: Unremarkable Other: No supplemental non-categorized findings. CT CERVICAL SPINE FINDINGS Alignment: No vertebral subluxation is observed. Skull base and vertebrae: Extensive spur formation in the cervical spine with continuous bridging spurring at C4-C5-C6-C7-T1, and non continuous spurring anteriorly at C2-3 and C3-4 with chronic fragmentation. There is bridging spurring of the left C4-5 facet joint. No fracture observed. Soft tissues and spinal canal: Atherosclerotic calcification of the common carotid arteries. Disc levels: Severe central narrowing of the thecal sac at C3-4 due to posterior intervertebral spurring as on image 32/9, with resulting prominent central narrowing of the thecal sac Right foraminal impingement due to spurring at C2-3 and possibly C4-5. Upper chest: Suspected pleural effusions of the lung apices. Other: No supplemental non-categorized findings. IMPRESSION: 1. No acute intracranial findings or acute cervical spine findings. 2. Suspected bilateral pleural effusions, dedicated chest radiography recommended. 3. 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 4. Diffuse idiopathic skeletal hyperostosis with prominent spurring in the cervical  spine. There is severe central narrowing of the thecal sac at C3-4 due to a large posterior intervertebral spur, as well as suspected right foraminal impingement at C2-3 and C4-5. 5. Atherosclerosis. Electronically Signed   By: Gaylyn Rong M.D.   On: 05/16/2018 08:47   Dg Knee Complete 4 Views Right  Result Date: 05/16/2018 CLINICAL DATA:  Fall this morning with right knee pain EXAM: RIGHT KNEE - COMPLETE 4+ VIEW COMPARISON:  None. FINDINGS: Mild tricompartmental osteoarthritic changes present no acute fracture or dislocation. No significant joint effusion. IMPRESSION: No acute fracture. Mild osteoarthritis. Electronically Signed   By: Reuel Boom  Micheline Maze M.D.   On: 05/16/2018 08:23   Dg Hip Unilat With Pelvis 2-3 Views Right  Result Date: 05/16/2018 CLINICAL DATA:  Fall this morning with right hip pain. EXAM: DG HIP (WITH OR WITHOUT PELVIS) 2-3V RIGHT COMPARISON:  01/16/2018 FINDINGS: Right total hip arthroplasty intact and normally located. No evidence of acute fracture or dislocation. Degenerative change of the left hip. Degenerative change of the spine. Mild diffuse osteopenia. IMPRESSION: No acute findings. 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 Electronically Signed   By: Elberta Fortis M.D.   On: 05/16/2018 08:22   Echo 05/17/2018:- Left ventricle: Images are limited despite use of Definity   contrast. The cavity size was normal. Wall thickness was   increased in a pattern of moderate LVH. Systolic function was   mildly to moderately reduced. The estimated ejection fraction was   in the range of 40% to 45%. Septal motion is paradoxical with   anteroseptal dyskinesis. The study was not technically sufficient   to allow evaluation of LV diastolic dysfunction due to atrial   fibrillation. - Aortic valve: Mildly calcified annulus. Trileaflet; mildly   calcified leaflets. - Mitral valve: 39mm [employer] Epic bioprosthesis in mitral   position. There was no significant regurgitation.  Mean gradient   (D): 8 mm Hg. Valve area by pressure half-time: 2.47 cm^2. - Left atrium: The atrium was mildly to moderately dilated. - Right ventricle: Systolic function was mildly reduced. - Right atrium: The atrium was mildly dilated. Central venous   pressure (est): 3 mm Hg. - Atrial septum: No defect or patent foramen ovale was identified. - Tricuspid valve: There was mild regurgitation. - Pulmonary arteries: Systolic pressure was severely increased. PA   peak pressure: 65 mm Hg (S). - Pericardium, extracardiac: There was no pericardial effusion.   Subjective:   Discharge Exam: Vitals:   05/17/18 2135 05/18/18 0644  BP: 119/75 (!) 144/96  Pulse: 88 92  Resp: 16 16  Temp: 98.7 F (37.1 C) 97.6 F (36.4 C)  SpO2: 100% 90%   Vitals:   05/17/18 1447 05/17/18 2124 05/17/18 2135 05/18/18 0644  BP: 140/72  119/75 (!) 144/96  Pulse: 70 67 88 92  Resp: 20 20 16 16   Temp: 98 F (36.7 C)  98.7 F (37.1 C) 97.6 F (36.4 C)  TempSrc: Oral  Oral Oral  SpO2: 92% 90% 100% 90%  Weight:    112 kg  Height:       General exam: Appears calm and comfortable  Respiratory system: No increased work of breathing, diminished lung sounds at bases, no wheezes, crackles, rhonchi Cardiovascular system: Irregularly irregular, no murmurs Gastrointestinal system: Abdomen is nondistended, soft and nontender. No organomegaly or masses felt. Normal bowel sounds heard. Central nervous system: Alert and oriented.  Grossly intact, moving all extremities Extremities: Lower extremities wrapped but diminished pedal edema noted r. Skin: Lower extremity is wrapped Psychiatry: Judgement and insight appear normal. Mood & affect appropriate.     The results of significant diagnostics from this hospitalization (including imaging, microbiology, ancillary and laboratory) are listed below for reference.     Microbiology: No results found for this or any previous visit (from the past 240 hour(s)).    Labs: BNP (last 3 results) Recent Labs    03/02/18 1835 04/17/18 1314 05/16/18 0956  BNP 435.0* 481.0* 404.0*   Basic Metabolic Panel: Recent Labs  Lab 05/16/18 0956 05/17/18 0702 05/18/18 0518  NA 143 146* 145  K 2.8* 3.5 3.8  CL 93*  94* 94*  CO2 41* 40* 40*  GLUCOSE 176* 212* 168*  BUN 7* 9 13  CREATININE 0.78 0.93 1.26*  CALCIUM 8.3* 8.8* 8.8*  MG 1.7  --  2.1   Liver Function Tests: No results for input(s): AST, ALT, ALKPHOS, BILITOT, PROT, ALBUMIN in the last 168 hours. No results for input(s): LIPASE, AMYLASE in the last 168 hours. No results for input(s): AMMONIA in the last 168 hours. CBC: Recent Labs  Lab 05/16/18 0956  WBC 5.9  NEUTROABS 4.5  HGB 13.1  HCT 42.5  MCV 93.4  PLT 216   Cardiac Enzymes: Recent Labs  Lab 05/16/18 0956  TROPONINI 0.04*   BNP: Invalid input(s): POCBNP CBG: Recent Labs  Lab 05/17/18 0740 05/17/18 1138 05/17/18 1726 05/17/18 2135 05/18/18 0729  GLUCAP 178* 140* 111* 154* 157*   D-Dimer No results for input(s): DDIMER in the last 72 hours. Hgb A1c No results for input(s): HGBA1C in the last 72 hours. Lipid Profile No results for input(s): CHOL, HDL, LDLCALC, TRIG, CHOLHDL, LDLDIRECT in the last 72 hours. Thyroid function studies No results for input(s): TSH, T4TOTAL, T3FREE, THYROIDAB in the last 72 hours.  Invalid input(s): FREET3 Anemia work up No results for input(s): VITAMINB12, FOLATE, FERRITIN, TIBC, IRON, RETICCTPCT in the last 72 hours. Urinalysis    Component Value Date/Time   COLORURINE AMBER (A) 05/16/2018 0947   APPEARANCEUR CLEAR 05/16/2018 0947   APPEARANCEUR Clear 07/23/2017   LABSPEC 1.018 05/16/2018 0947   PHURINE 7.0 05/16/2018 0947   GLUCOSEU NEGATIVE 05/16/2018 0947   GLUCOSEU 500 (A) 07/14/2017 1053   HGBUR SMALL (A) 05/16/2018 0947   BILIRUBINUR NEGATIVE 05/16/2018 0947   BILIRUBINUR Negative 07/23/2017   KETONESUR NEGATIVE 05/16/2018 0947   PROTEINUR 30 (A) 05/16/2018 0947    UROBILINOGEN 0.2 07/14/2017 1053   NITRITE NEGATIVE 05/16/2018 0947   LEUKOCYTESUR NEGATIVE 05/16/2018 0947   LEUKOCYTESUR Negative (A) 07/23/2017   Sepsis Labs Invalid input(s): PROCALCITONIN,  WBC,  LACTICIDVEN Microbiology No results found for this or any previous visit (from the past 240 hour(s)).   Time coordinating discharge: 35  SIGNED:   Delaine Lame, MD  [facility] 05/18/2018, 11:30 AM  If 7PM-7AM, please contact night-coverage [email or web address] Password [email or web address]

## 2018-05-18 NOTE — Progress Notes (Signed)
SATURATION QUALIFICATIONS: (This note is used to comply with regulatory documentation for home oxygen)  Patient Saturations on Room Air at Rest = 50%  Patient Saturations on Room Air while Ambulating = nonambulatory  Patient Saturations on  nonambulatory  Please briefly explain why patient needs home oxygen: Patient oxygen saturation dropped to 50% on room air, increased to 90% on 3 liter of oxygen via nasal cannula

## 2018-05-18 NOTE — Care Management (Signed)
Patient Information   Patient Name Cody Hale, Cody Hale ([id number]) Sex Male DOB 12-06-1950  Room Bed  A340 A340-01  Patient Demographics   Address [address] [place] [address] Phone [phone] (Home) *Preferred* [phone] (Mobile)  Patient Ethnicity & Race   Ethnic Group Patient Race  Not Hispanic or Latino White or Caucasian  Emergency Contact(s)   Name Relation Home Work Mobile  Carter,Chris Daughter [phone]    Weekley,Melissa Sister [phone]    Documents on File    Status Date Received Description  Documents for the Patient  Historic Radiology Documentation Not Received    [place] HIPAA NOTICE OF PRIVACY - Scanned Not Received    [facility] E-Signature HIPAA Notice of Privacy Received 08/10/12   Driver's License Not Received    Insurance Card Received 01/11/15 HUMANA  Advance Directives/Living Will/HCPOA/POA Received 03/19/18 Full code  Driver's License Received 08/12/12   Release of Information Not Received    Release of Information Not Received    Insurance Card Received 01/19/13   HIM ROI Authorization Not Received    Release of Information Not Received    HIM ROI Authorization Not Received    Release of Information Not Received    HIM ROI Authorization Not Received    HIM ROI Authorization Not Received (Expired)  DDS  HIM ROI Authorization Not Received    HIM ROI Authorization Not Received (Expired)  DDS  HIM ROI Authorization Not Received    AMB HH/NH/Hospice Not Received  06/14 [facility]  HIM ROI Authorization Not Received  Patient Accounting - Myrtis Hopping  Release of Information Not Received    Insurance Card Received 09/29/14 UHC - [employer] [place] -SRS  Insurance Card Not Received    [place] E-Signature HIPAA Notice of Privacy Received 02/02/13   AMB Correspondence Not Received  Outpt Reg Form [facility]   HIM ROI Authorization Not Received    Release of Information Not Received    HIM ROI Authorization Not Received    Release of  Information Not Received    HIM ROI Authorization Not Received    Release of Information Not Received    HIM ROI Authorization Not Received    Release of Information Not Received    AMB Correspondence Not Received  10/14 Cardiac Rehab [facility]   AMB HH/NH/Hospice Not Received  09/14 POC Adv [facility]   [place] E-Signature HIPAA Notice of Privacy Spanish Not Received    Advanced Beneficiary Notice (ABN) Not Received    HIM ROI Authorization Not Received    Release of Information Not Received    HIM ROI Authorization Not Received    HIM ROI Authorization Not Received (Expired)  1.21.15 DDS:cbr  HIM ROI Authorization Not Received    AMB Correspondence  08/17/13 Request BCBS  AMB Correspondence  01/31/14 CONTROLLED SUBSTANCE CONTRACT [facility]  HIM ROI Authorization (Expired) 06/20/14 Authorization for batch [employer] [place] 06/20/2014 #5  Insurance Card Received 06/21/14 BCBS  HIM ROI Authorization (Expired) 08/02/14 Authorization for batch EMSI/Blue Cross Blue [employer] of [place]         ltd      08/02/2014.4  HIM ROI Authorization (Expired) 08/15/14 Authorization for batch EMSI/BCBS 08/15/2014 #11 JC  HIM ROI Authorization (Expired) 09/21/14 Authorization for batch EMSI    ltd 09/20/2014.4  HIM ROI Authorization (Expired) 09/23/14 Authorization for batch EMSI    ltd  09/21/2014  Other Photo ID Not Received    Insurance Card Received 01/03/15 HUMANA GOLD  AMB HH/NH/Hospice  01/28/15 CERTIFICATION/POC [facility]  Insurance Card Received 02/24/15 [place] MEDICAID  AMB HH/NH/Hospice  02/04/15 ORDER [facility]  AMB Provider Completed Forms  03/01/15 IMMUNIZATION [facility]  AMB Provider Completed Forms  04/13/15 PCP AUTH AND POC [facility]  AMB HH/NH/Hospice  03/13/15 ORDER [facility]  AMB Correspondence  07/11/15 PHYSICIANS ORDER SHEET [facility]  AMB Correspondence  12/05/15 APPROVAL AETNA  AMB Provider Completed Forms   01/17/16 RX&PROGRESS NOTES/EVALUATION [facility]  Insurance Card Received 10/21/16 Medicare / Medicaid   [facility] Policy for Pain - E Signature     [place] E-Signature HIPAA Notice of Privacy Signed 05/20/16   AMB Provider Completed Forms  06/06/16 CARE INSTRUCTIONS FOR INSULIN  AMB HH/NH/Hospice  04/22/16 PHYSICIANS ORDER [facility] [employer] Card   pt did not have cards, 08/07/16 mar  AMB Provider Completed Forms  07/29/16 PHARMACIST COMMUNICATION TO PHYS [facility]  E-Signature AOB Spanish Not Received    AMB Provider Completed Forms  09/03/16 ORDERS [facility]  AMB Provider Completed Forms  10/07/16 PRE OPERATIVE CONSULTATION [facility]  AMB Correspondence  09/26/16 PHYSICIAN ORDER [facility]  AMB Correspondence  11/18/16 STATEMENT CERTIFYING THERAPEUTIC Peggyann Juba MD, T  AMB Provider Completed Forms  10/31/16 CONSULTANT PHARMACIST COMMUNICATION TO PHYS [facility]  AMB Correspondence  01/07/17 PHYSICIAN ORDER SHEET [employer]  Insurance Card Received 02/03/17 Medicare & Medicaid  AMB Patient Logs/Info  02/14/17   AMB Provider Completed Forms  01/20/17 LETTER CONSULTANT PHARMACIST /[facility]  AMB Provider Completed Forms  01/28/17 [facility]  AMB Provider Completed Forms Received 05/15/17 ORDER/CARE INSTRUCTIONS FOR INSULIN  AMB HH/NH/Hospice Received 05/15/17 PHYSICIAN ORDER [facility]  AMB Correspondence Received 05/28/17 CONTROLLED SUBSTANCE CONTRACT [facility]  AMB Correspondence Received 07/03/17 PHYSICIAN ORDER SHEET [facility]  Insurance Card Received 08/13/17 pt does not have cards today  AMB Correspondence Received 09/01/17 REQUEST [facility]  AMB Provider Completed Forms Received 11/11/17 CARE FOR INSULIN DEPENDENT RESIDENT  AMB Provider Completed Forms Received 11/11/17 DIET ORDERS  AMB Provider Completed Forms Received 07/31/17 PHYSICIAN'S ORDER DIABETIC SUPPLIES [facility]   Release of Information Received 11/11/17   AMB Provider Completed Forms Received 11/11/17 ORDER/CONFIRMATION [facility]  AMB Provider Completed Forms Received 10/23/17 PHARMACIST COMMUNICATION TO PHYSICIAN [facility]  AMB Correspondence Received 11/26/17 04/19-05/19 RX/PHYS ORDER [employer]  Insurance Card Received 01/09/18 MCR A/B & MEDICAID-2019  AMB Correspondence Received 01/23/18 PHYS ORDER SHEET [facility]  AMB Provider Completed Forms Received 02/09/18 ORDERS&CARE INSTRUCTIONS [facility]  AMB Correspondence Received 01/09/18 RX [facility]  AMB Correspondence Received 02/17/18 NOTES [facility]  AMB Correspondence Received 01/27/18 EVALUATION [facility]  AMB HH/NH/Hospice Received 02/10/18 [facility]  AMB HH/NH/Hospice Received 03/10/18 [facility]  HIM ROI Authorization  04/24/18 [facility]  AMB HH/NH/Hospice Received 04/29/18 HH CERTIFICATION/POC [facility]  AMB Provider Completed Forms Received 04/29/18 CARE INSTRUCTIONS FOR INSULIN DEPENDENT RESIDENT  AMB Provider Completed Forms Received 04/29/18 DIET ORDERS  AMB Provider Completed Forms Received 04/29/18 PHYSICIANS ORDER SHEET [facility]  Release of Information (Deleted) 12/07/12 PSCAN ENTERED INFO: [id number]  AMB Correspondence (Deleted) 02/03/13 Outpt Reg Form  AMB Correspondence Not Received (Deleted)  09/14 POC [facility]   AMB Correspondence (Deleted) 01/31/14 CONTROLLED SUBSTANCE CONTRACT [facility]  AMB HH/NH/Hospice (Deleted) 02/16/15 7/16 ORDER [facility]  AMB HH/NH/Hospice (Deleted) 02/16/15 7/16 [facility]  AMB HH/NH/Hospice (Deleted) 02/16/15 FILED IN ERROR  AMB HH/NH/Hospice (Deleted) 02/16/15 7/16 ORDER [facility]  AMB Correspondence (Deleted) 03/01/15 [facility]UNIZATION [facility]  AMB Correspondence (Deleted) 04/22/16 PHYSICIANS ORDER [facility]  AMB Correspondence (Deleted) 09/03/16 ORDERS [name]  DDEPARTMENT OF HRDSS  AMB Provider Completed Forms (Deleted) 10/07/16 PRE OPERATIVE CONSULTATION [facility]  AMB Provider Completed Forms (Deleted) 10/31/16 COMMUNICATION CONSULTANT PHARMACIST  HIM Release of Information Output (Deleted) 04/07/18 Requested records  HIM Release of Information Output (Deleted) 04/24/18 Requested records  Documents for the Encounter  AOB (Assignment of Insurance Benefits) Not Received    E-signature AOB Signed 05/16/18   MEDICARE RIGHTS Not Received    E-signature Medicare Rights Signed 05/16/18   Cardiac Monitoring Strip Received 05/16/18   Cardiac Monitoring Strip Shift Summary Received 05/16/18   Admission Information   Attending Provider Admitting Provider Admission Type Admission Date/Time  Purohit, Salli Quarry, MD Delaine Lame, MD Emergency 05/16/18 0703  Discharge Date Hospital Service Auth/Cert Status Service Area   Internal Medicine Incomplete [facility]  Unit Room/Bed Admission Status   AP-DEPT 300 A340/A340-01 Admission (Confirmed)   Admission   Complaint  shoulder pain  Hospital Account   Name Acct ID Class Status Primary Coverage  Iyan, Flett [id number] Inpatient Open MEDICARE - MEDICARE PART A AND B      Guarantor Account (for Hospital Account [id number])   Name Relation to Pt Service Area Active? Acct Type  Sharilyn Sites D Self CHSA Yes Personal/Family  Address Phone    [address] [place], [place] [address] [phone](H)        Coverage Information (for Hospital Account [id number])   1. MEDICARE/MEDICARE PART A AND B   F/O Payor/Plan Precert #  MEDICARE/MEDICARE PART A AND B   Subscriber Subscriber #  Emmit, Oriley [id number]  Address Phone  [address] [place], [place] [address]   2. MEDICAID [place]/MEDICAID OF [place]   F/O Payor/Plan Precert #  MEDICAID [place]/MEDICAID OF [place]   Subscriber Subscriber #  Jarin, [place]field [id number]  Address Phone  [address] [address] [place], [place] [address] [phone]

## 2018-05-18 NOTE — NC FL2 (Signed)
[place] MEDICAID FL2 LEVEL OF CARE SCREENING TOOL     IDENTIFICATION  Patient Name: Cody Hale Birthdate: Dec 24, 1950 Sex: male Admission Date (Current Location): 05/16/2018  [facility] and [place] Number:  [employer] and Address:  [facility],  [address], [place] [address]      Provider Number: [id number]  Attending Physician Name and Address:  Delaine Lame, MD  Relative Name and Phone Number:       Current Level of Care: Hospital Recommended Level of Care: Assisted Living Facility Prior Approval Number:    Date Approved/Denied:   PASRR Number:    Discharge Plan: Domiciliary (Rest home)(ALF)    Current Diagnoses: Patient Active Problem List   Diagnosis Date Noted  . CHF (congestive heart failure) (HCC) 05/17/2018  . Acute on chronic systolic (congestive) heart failure (HCC) 05/16/2018  . GERD with esophagitis 04/22/2018  . Nonischemic cardiomyopathy (HCC) 03/23/2018  . Therapeutic opioid-induced constipation (OIC) 12/04/2017  . Venous stasis dermatitis of both lower extremities 07/31/2017  . Unilateral primary osteoarthritis, right hip 04/22/2017  . 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 Unilateral primary osteoarthritis, right knee 09/18/2016  . Acute on chronic systolic CHF (congestive heart failure) (HCC) 05/05/2016  . Seasonal allergic rhinitis due to pollen 03/13/2016  . Chronic systolic heart failure (HCC) 01/11/2015  . Chronic atrial fibrillation 01/11/2015  . S/P mitral valve replacement with bioprosthetic valve 01/11/2015  . Depression with somatization 11/22/2014  . Primary osteoarthritis of both knees 11/22/2014  . COPD (chronic obstructive pulmonary disease) with chronic bronchitis (HCC) 05/12/2014  . B12 deficiency anemia 02/03/2014  . Routine general medical examination at a health care facility 03/21/2013  . Hyperlipidemia with target LDL less than 70 03/19/2013  . Essential hypertension, malignant  12/01/2012  . Type II diabetes mellitus with manifestations (HCC)   . Gastroparesis due to DM (HCC) 11/30/2012  . OSA (obstructive sleep apnea) 11/16/2012  . Endocarditis-resolved 08/26/2012  . LBBB (left bundle branch block) 07/03/2012  . Morbid obesity (HCC) 06/27/2012    Orientation RESPIRATION BLADDER Height & Weight     Self, Time, Situation, Place  O2(3L) Continent Weight: 246 lb 14.6 oz (112 kg) Height:  5\' 9"  (175.3 cm)  BEHAVIORAL SYMPTOMS/MOOD NEUROLOGICAL BOWEL NUTRITION STATUS      Continent Diet(heart healthy/carb modified sodium restricted (facility indicates that this equates to their regular reduced concentrated sweets diet). )  AMBULATORY STATUS COMMUNICATION OF NEEDS Skin   Limited Assist Verbally Normal                       Personal Care Assistance Level of Assistance  Bathing, Feeding, Dressing Bathing Assistance: Limited assistance Feeding assistance: Independent Dressing Assistance: Limited assistance     Functional Limitations Info  Sight, Hearing, Speech Sight Info: Adequate Hearing Info: Adequate Speech Info: Adequate    SPECIAL CARE FACTORS FREQUENCY                       Contractures Contractures Info: Not present    Additional Factors Info  Code Status, Allergies Code Status Info: Full Code Allergies Info: Daptomycin, Lisinopril, Tape           Current Medications (05/18/2018):  This is the current hospital active medication list Current Facility-Administered Medications  Medication Dose Route Frequency Provider Last Rate Last Dose  . 0.9 %  sodium chloride infusion  250 mL Intravenous PRN Purohit, Salli Quarry, MD      .  acetaminophen (TYLENOL) tablet 650 mg  650 mg Oral Q4H PRN Purohit, Shrey C, MD      . acidophilus (RISAQUAD) capsule 1 capsule  1 capsule Oral BID Purohit, Salli Quarry, MD   1 capsule at 05/18/18 0855  . carvedilol (COREG) tablet 3.125 mg  3.125 mg Oral BID Purohit, Shrey C, MD   3.125 mg at 05/18/18 0855  .  fluticasone (FLONASE) 50 MCG/ACT nasal spray 2 spray  2 spray Each Nare Daily Purohit, Salli Quarry, MD   2 spray at 05/18/18 0854  . furosemide (LASIX) injection 60 mg  60 mg Intravenous BID Purohit, Shrey C, MD   60 mg at 05/18/18 0854  . guaiFENesin (ROBITUSSIN) 100 MG/5ML solution 100 mg  5 mL Oral TID PRN Purohit, Shrey C, MD      . insulin aspart (novoLOG) injection 0-5 Units  0-5 Units Subcutaneous QHS Purohit, Shrey C, MD      . insulin aspart (novoLOG) injection 0-9 Units  0-9 Units Subcutaneous TID WC Purohit, Salli Quarry, MD   2 Units at 05/18/18 0854  . loratadine (CLARITIN) tablet 10 mg  10 mg Oral QHS Purohit, Shrey C, MD   10 mg at 05/17/18 2146  . metoCLOPramide (REGLAN) tablet 5 mg  5 mg Oral TID AC & HS Purohit, Shrey C, MD   5 mg at 05/18/18 0855  . midodrine (PROAMATINE) tablet 5 mg  5 mg Oral TID Purohit, Salli Quarry, MD   5 mg at 05/18/18 0855  . multivitamin with minerals tablet 1 tablet  1 tablet Oral Daily Purohit, Salli Quarry, MD   1 tablet at 05/18/18 0855  . nitroGLYCERIN (NITROSTAT) SL tablet 0.4 mg  0.4 mg Sublingual Q5 min PRN Purohit, Shrey C, MD      . ondansetron (ZOFRAN) tablet 4 mg  4 mg Oral Q6H PRN Purohit, Salli Quarry, MD   4 mg at 05/17/18 0127  . oxyCODONE (Oxy IR/ROXICODONE) immediate release tablet 5 mg  5 mg Oral Q6H PRN Purohit, Salli Quarry, MD   5 mg at 05/17/18 2146  . pantoprazole (PROTONIX) EC tablet 40 mg  40 mg Oral Daily Purohit, Shrey C, MD   40 mg at 05/18/18 0855  . polyethylene glycol (MIRALAX / GLYCOLAX) packet 17 g  17 g Oral Daily Purohit, Shrey C, MD   17 g at 05/18/18 0856  . rivaroxaban (XARELTO) tablet 20 mg  20 mg Oral QPM Purohit, Shrey C, MD   20 mg at 05/17/18 1810  . simvastatin (ZOCOR) tablet 20 mg  20 mg Oral q1800 Purohit, Shrey C, MD   20 mg at 05/17/18 1810  . sodium chloride flush (NS) 0.9 % injection 3 mL  3 mL Intravenous Q12H Purohit, Shrey C, MD   3 mL at 05/18/18 0856  . sodium chloride flush (NS) 0.9 % injection 3 mL  3 mL Intravenous PRN  Purohit, Shrey C, MD      . tiZANidine (ZANAFLEX) tablet 4 mg  4 mg Oral Q8H PRN Purohit, Shrey C, MD   4 mg at 05/16/18 1726  . Vilazodone HCl TABS 40 mg  40 mg Oral Daily Purohit, Shrey C, MD   40 mg at 05/18/18 0855     Discharge Medications: Medication List    TAKE these medications   carvedilol 3.125 MG tablet Commonly known as:  COREG Take 1 tablet (3.125 mg total) by mouth 2 (two) times daily.   fluticasone 50 MCG/ACT nasal spray Commonly known as:  FLONASE Place  2 sprays into both nostrils daily.   guaiFENesin 200 MG/10ML Soln Take 5 mLs by mouth 3 (three) times daily as needed (cough).   levocetirizine 5 MG tablet Commonly known as:  XYZAL Take 1 tablet (5 mg total) by mouth every evening.   metoCLOPramide 5 MG tablet Commonly known as:  REGLAN Take 1 tablet (5 mg total) by mouth 4 (four) times daily.   midodrine 10 MG tablet Commonly known as:  PROAMATINE Take 0.5 tablets (5 mg total) by mouth 3 (three) times daily.   multivitamin tablet Take 1 tablet by mouth daily.   nitroGLYCERIN 0.4 MG SL tablet Commonly known as:  NITROSTAT Place 1 tablet (0.4 mg total) under the tongue every 5 (five) minutes as needed for chest pain. Max 3 doses.   NOVOFINE 30G X 8 MM Misc Generic drug:  Insulin Pen Needle Inject 1 packet into the skin as needed.   ondansetron 4 MG tablet Commonly known as:  ZOFRAN Take 4 mg by mouth every 6 (six) hours as needed for nausea or vomiting.   oxyCODONE 5 MG immediate release tablet Commonly known as:  Oxy IR/ROXICODONE Take 5 mg by mouth every 6 (six) hours as needed for severe pain.   pantoprazole 40 MG tablet Commonly known as:  PROTONIX Take 1 tablet (40 mg total) by mouth daily.   polyethylene glycol packet Commonly known as:  MIRALAX / GLYCOLAX Take 17 g by mouth daily.   RISA-BID PROBIOTIC Tabs Take 1 tablet by mouth 2 (two) times daily.   simvastatin 20 MG tablet Commonly known as:  ZOCOR TAKE 1 TABLET  BY MOUTH ONCE DAILY. What changed:  when to take this   tiZANidine 4 MG tablet Commonly known as:  ZANAFLEX Take 1 tablet (4 mg total) by mouth every 8 (eight) hours as needed for muscle spasms.   torsemide 20 MG tablet Commonly known as:  DEMADEX Take 1 tablet (20 mg total) by mouth 2 (two) times daily. What changed:  when to take this   VIIBRYD 40 MG Tabs Generic drug:  Vilazodone HCl TAKE 1 TABLET BY MOUTH ONCE DAILY. What changed:  how much to take   XARELTO 20 MG Tabs tablet Generic drug:  rivaroxaban TAKE 1 TABLET BY MOUTH ONCE DAILY. What changed:  how much to take     Relevant Imaging Results:  Relevant Lab Results:   Additional Information    Babygirl Trager, Juleen China, LCSW

## 2018-05-18 NOTE — Clinical Social Work Note (Signed)
Facility notified of discharge. Discharge clinicals sent to facility. Facility will contact LCSW when patient's oxygen has been delivered.   LCSW signing off.     Vetra Shinall, Juleen China, LCSW

## 2018-05-18 NOTE — Discharge Instructions (Signed)
Heart Failure Action Plan A heart failure action plan helps you understand what to do when you have symptoms of heart failure. Follow the plan that was created by you and your health care provider. Review your plan each time you visit your health care provider. Red zone These signs and symptoms mean you should get medical help right away:  You have trouble breathing when resting.  You have a dry cough that is getting worse.  You have swelling or pain in your legs or abdomen that is getting worse.  You suddenly gain more than 2-3 lb (0.9-1.4 kg) in a day, or more than 5 lb (2.3 kg) in one week. This amount may be more or less depending on your condition.  You have trouble staying awake or you feel confused.  You have chest pain.  You do not have an appetite.  You pass out.  If you experience any of these symptoms:  Call your local emergency services (911 in the [place] right away or seek help at the emergency department of the nearest hospital.  Yellow zone These signs and symptoms mean your condition may be getting worse and you should make some changes:  You have trouble breathing when you are active or you need to sleep with extra pillows.  You have swelling in your legs or abdomen.  You gain 2-3 lb (0.9-1.4 kg) in one day, or 5 lb (2.3 kg) in one week. This amount may be more or less depending on your condition.  You get tired easily.  You have trouble sleeping.  You have a dry cough.  If you experience any of these symptoms:  Contact your health care provider within the next day.  Your health care provider may adjust your medicines.  Green zone These signs mean you are doing well and can continue what you are doing:  You do not have shortness of breath.  You have very little swelling or no new swelling.  Your weight is stable (no gain or loss).  You have a normal activity level.  You do not have chest pain or any other new symptoms.  Follow these  instructions at home:  Take over-the-counter and prescription medicines only as told by your health care provider.  Weigh yourself daily. Your target weight is __________ lb (__________ kg). ? Call your health care provider if you gain more than __________ lb (__________ kg) in a day, or more than __________ lb (__________ kg) in one week.  Eat a heart-healthy diet. Work with a diet and nutrition specialist (dietitian) to create an eating plan that is best for you.  Keep all follow-up visits as told by your health care provider. This is important. Where to find more information:  American Heart Association: www.heart.org Summary  Follow the action plan that was created by you and your health care provider.  Get help right away if you have any symptoms in the Red zone. This information is not intended to replace advice given to you by your health care provider. Make sure you discuss any questions you have with your health care provider. Document Released: 08/03/2016 Document Revised: 08/03/2016 Document Reviewed: 08/03/2016 Elsevier Interactive Patient Education  2018 Elsevier Inc.  

## 2018-05-18 NOTE — Care Management Note (Signed)
Case Management Note  Patient Details  Name: Cody Hale MRN: [id number] Date of Birth: 02-17-1951  Subjective/Objective:      Admitted with CHF. Pt from [facility]. Pt needs home oxygen and HH at DC. Pt defers to [employer] reps for DME/HH choice. Per CSW they have chosen [place] for DME and [facility] for [facility].               Action/Plan: DC back to ALF today. CM has given [facility] HH referral for RN and PT and [facility] referral for DME. Pt will transport via EMS, port O2 devices does not need to be delivered to hospital prior to DC but concentrator must be received by ALF prior to EMS transport.   Expected Discharge Date:  05/18/18               Expected Discharge Plan:  Assisted Living / Rest Home(with HH)  In-House Referral:  Clinical Social Work  Discharge planning Services  CM Consult  Post Acute Care Choice:  Durable Medical Equipment, Home Health Choice offered to:  Patient  DME Arranged:  Oxygen DME Agency:  [facility]  HH Arranged:  RN, PT HH Agency:  [facility]  Status of Service:  Completed, signed off  Malcolm Metro, RN 05/18/2018, 11:49 AM

## 2018-05-19 ENCOUNTER — Encounter (HOSPITAL_COMMUNITY): Payer: Self-pay | Admitting: Emergency Medicine

## 2018-05-19 ENCOUNTER — Other Ambulatory Visit: Payer: Self-pay

## 2018-05-19 ENCOUNTER — Telehealth: Payer: Self-pay | Admitting: *Deleted

## 2018-05-19 ENCOUNTER — Observation Stay (HOSPITAL_BASED_OUTPATIENT_CLINIC_OR_DEPARTMENT_OTHER)
Admission: EM | Admit: 2018-05-19 | Discharge: 2018-05-20 | Disposition: A | Discharge status: Skilled Nursing Facility | Payer: Medicare Other | Source: Home / Self Care | Attending: Emergency Medicine | Admitting: Emergency Medicine

## 2018-05-19 ENCOUNTER — Emergency Department (HOSPITAL_COMMUNITY): Payer: Medicare Other

## 2018-05-19 DIAGNOSIS — R000 Tachycardia, unspecified: Secondary | ICD-10-CM | POA: Diagnosis not present

## 2018-05-19 DIAGNOSIS — K3184 Gastroparesis: Secondary | ICD-10-CM

## 2018-05-19 DIAGNOSIS — L03116 Cellulitis of left lower limb: Secondary | ICD-10-CM | POA: Diagnosis not present

## 2018-05-19 DIAGNOSIS — Z79899 Other long term (current) drug therapy: Secondary | ICD-10-CM | POA: Insufficient documentation

## 2018-05-19 DIAGNOSIS — R4182 Altered mental status, unspecified: Secondary | ICD-10-CM | POA: Diagnosis not present

## 2018-05-19 DIAGNOSIS — E1143 Type 2 diabetes mellitus with diabetic autonomic (poly)neuropathy: Secondary | ICD-10-CM | POA: Insufficient documentation

## 2018-05-19 DIAGNOSIS — L8990 Pressure ulcer of unspecified site, unspecified stage: Secondary | ICD-10-CM

## 2018-05-19 DIAGNOSIS — Z7901 Long term (current) use of anticoagulants: Secondary | ICD-10-CM

## 2018-05-19 DIAGNOSIS — F329 Major depressive disorder, single episode, unspecified: Secondary | ICD-10-CM

## 2018-05-19 DIAGNOSIS — G4733 Obstructive sleep apnea (adult) (pediatric): Secondary | ICD-10-CM | POA: Insufficient documentation

## 2018-05-19 DIAGNOSIS — S0990XA Unspecified injury of head, initial encounter: Secondary | ICD-10-CM | POA: Diagnosis not present

## 2018-05-19 DIAGNOSIS — D519 Vitamin B12 deficiency anemia, unspecified: Secondary | ICD-10-CM | POA: Diagnosis present

## 2018-05-19 DIAGNOSIS — R400 Somnolence: Secondary | ICD-10-CM | POA: Diagnosis not present

## 2018-05-19 DIAGNOSIS — Z96641 Presence of right artificial hip joint: Secondary | ICD-10-CM | POA: Insufficient documentation

## 2018-05-19 DIAGNOSIS — I482 Chronic atrial fibrillation, unspecified: Secondary | ICD-10-CM | POA: Insufficient documentation

## 2018-05-19 DIAGNOSIS — J90 Pleural effusion, not elsewhere classified: Secondary | ICD-10-CM | POA: Diagnosis not present

## 2018-05-19 DIAGNOSIS — F332 Major depressive disorder, recurrent severe without psychotic features: Secondary | ICD-10-CM | POA: Diagnosis present

## 2018-05-19 DIAGNOSIS — I110 Hypertensive heart disease with heart failure: Secondary | ICD-10-CM | POA: Diagnosis not present

## 2018-05-19 DIAGNOSIS — F339 Major depressive disorder, recurrent, unspecified: Secondary | ICD-10-CM

## 2018-05-19 DIAGNOSIS — Z953 Presence of xenogenic heart valve: Secondary | ICD-10-CM | POA: Insufficient documentation

## 2018-05-19 DIAGNOSIS — J811 Chronic pulmonary edema: Secondary | ICD-10-CM | POA: Diagnosis not present

## 2018-05-19 DIAGNOSIS — K2100 Gastro-esophageal reflux disease with esophagitis, without bleeding: Secondary | ICD-10-CM | POA: Diagnosis present

## 2018-05-19 DIAGNOSIS — Z6836 Body mass index (BMI) 36.0-36.9, adult: Secondary | ICD-10-CM

## 2018-05-19 DIAGNOSIS — Z87891 Personal history of nicotine dependence: Secondary | ICD-10-CM

## 2018-05-19 DIAGNOSIS — I2510 Atherosclerotic heart disease of native coronary artery without angina pectoris: Secondary | ICD-10-CM | POA: Insufficient documentation

## 2018-05-19 DIAGNOSIS — R509 Fever, unspecified: Secondary | ICD-10-CM | POA: Diagnosis not present

## 2018-05-19 DIAGNOSIS — I10 Essential (primary) hypertension: Secondary | ICD-10-CM | POA: Diagnosis present

## 2018-05-19 DIAGNOSIS — J449 Chronic obstructive pulmonary disease, unspecified: Secondary | ICD-10-CM | POA: Diagnosis present

## 2018-05-19 DIAGNOSIS — Z7951 Long term (current) use of inhaled steroids: Secondary | ICD-10-CM | POA: Insufficient documentation

## 2018-05-19 DIAGNOSIS — R410 Disorientation, unspecified: Secondary | ICD-10-CM | POA: Diagnosis not present

## 2018-05-19 DIAGNOSIS — R5383 Other fatigue: Secondary | ICD-10-CM | POA: Diagnosis not present

## 2018-05-19 DIAGNOSIS — I5023 Acute on chronic systolic (congestive) heart failure: Secondary | ICD-10-CM | POA: Diagnosis not present

## 2018-05-19 DIAGNOSIS — Z794 Long term (current) use of insulin: Secondary | ICD-10-CM

## 2018-05-19 DIAGNOSIS — R4020 Unspecified coma: Secondary | ICD-10-CM | POA: Diagnosis not present

## 2018-05-19 DIAGNOSIS — F450 Somatization disorder: Secondary | ICD-10-CM

## 2018-05-19 DIAGNOSIS — S91302D Unspecified open wound, left foot, subsequent encounter: Secondary | ICD-10-CM | POA: Diagnosis not present

## 2018-05-19 DIAGNOSIS — R531 Weakness: Secondary | ICD-10-CM | POA: Diagnosis not present

## 2018-05-19 DIAGNOSIS — F32A Depression, unspecified: Secondary | ICD-10-CM

## 2018-05-19 DIAGNOSIS — R5381 Other malaise: Secondary | ICD-10-CM | POA: Diagnosis not present

## 2018-05-19 DIAGNOSIS — I5022 Chronic systolic (congestive) heart failure: Secondary | ICD-10-CM | POA: Insufficient documentation

## 2018-05-19 DIAGNOSIS — R404 Transient alteration of awareness: Secondary | ICD-10-CM

## 2018-05-19 DIAGNOSIS — E872 Acidosis: Secondary | ICD-10-CM | POA: Diagnosis not present

## 2018-05-19 DIAGNOSIS — J9602 Acute respiratory failure with hypercapnia: Secondary | ICD-10-CM | POA: Diagnosis not present

## 2018-05-19 LAB — COMPREHENSIVE METABOLIC PANEL
ALT: 19 U/L (ref 0–44)
AST: 32 U/L (ref 15–41)
Albumin: 2.7 g/dL — ABNORMAL LOW (ref 3.5–5.0)
Alkaline Phosphatase: 73 U/L (ref 38–126)
Anion gap: 7 (ref 5–15)
BUN: 19 mg/dL (ref 8–23)
CO2: 39 mmol/L — ABNORMAL HIGH (ref 22–32)
Calcium: 8.1 mg/dL — ABNORMAL LOW (ref 8.9–10.3)
Chloride: 92 mmol/L — ABNORMAL LOW (ref 98–111)
Creatinine, Ser: 1.18 mg/dL (ref 0.61–1.24)
GFR calc Af Amer: >60 mL/min (ref >60)
GFR calc non Af Amer: >60 mL/min (ref >60)
Glucose, Bld: 180 mg/dL — ABNORMAL HIGH (ref 70–99)
Potassium: 3.6 mmol/L (ref 3.5–5.1)
Sodium: 138 mmol/L (ref 135–145)
Total Bilirubin: 1 mg/dL (ref 0.3–1.2)
Total Protein: 5.4 g/dL — ABNORMAL LOW (ref 6.5–8.1)

## 2018-05-19 LAB — BASIC METABOLIC PANEL
Anion gap: 6 (ref 5–15)
BUN: 19 mg/dL (ref 8–23)
CO2: 39 mmol/L — ABNORMAL HIGH (ref 22–32)
Calcium: 8 mg/dL — ABNORMAL LOW (ref 8.9–10.3)
Chloride: 92 mmol/L — ABNORMAL LOW (ref 98–111)
Creatinine, Ser: 1.19 mg/dL (ref 0.61–1.24)
GFR calc Af Amer: >60 mL/min (ref >60)
GFR calc non Af Amer: >60 mL/min (ref >60)
Glucose, Bld: 179 mg/dL — ABNORMAL HIGH (ref 70–99)
Potassium: 3.6 mmol/L (ref 3.5–5.1)
Sodium: 137 mmol/L (ref 135–145)

## 2018-05-19 LAB — CBC
HCT: 39.1 % (ref 39.0–52.0)
Hemoglobin: 11.8 g/dL — ABNORMAL LOW (ref 13.0–17.0)
MCH: 29.1 pg (ref 26.0–34.0)
MCHC: 30.2 g/dL (ref 30.0–36.0)
MCV: 96.3 fL (ref 80.0–100.0)
Platelets: 156 10*3/uL (ref 150–400)
RBC: 4.06 MIL/uL — ABNORMAL LOW (ref 4.22–5.81)
RDW: 15.7 % — ABNORMAL HIGH (ref 11.5–15.5)
WBC: 5.4 10*3/uL (ref 4.0–10.5)
nRBC: 0 % (ref 0.0–0.2)

## 2018-05-19 LAB — URINALYSIS, ROUTINE W REFLEX MICROSCOPIC
Bilirubin Urine: NEGATIVE
Glucose, UA: NEGATIVE mg/dL
Hgb urine dipstick: NEGATIVE
Ketones, ur: NEGATIVE mg/dL
Leukocytes, UA: NEGATIVE
Nitrite: NEGATIVE
Protein, ur: NEGATIVE mg/dL
Specific Gravity, Urine: 1.014 (ref 1.005–1.030)
pH: 5 (ref 5.0–8.0)

## 2018-05-19 LAB — TROPONIN I
Troponin I: 0.03 ng/mL (ref <0.03)
Troponin I: 0.03 ng/mL (ref <0.03)

## 2018-05-19 LAB — BRAIN NATRIURETIC PEPTIDE: B Natriuretic Peptide: 373 pg/mL — ABNORMAL HIGH (ref 0.0–100.0)

## 2018-05-19 LAB — MAGNESIUM: Magnesium: 1.7 mg/dL (ref 1.7–2.4)

## 2018-05-19 LAB — AMMONIA: Ammonia: 18 umol/L (ref 9–35)

## 2018-05-19 LAB — CBG MONITORING, ED
Glucose-Capillary: 172 mg/dL — ABNORMAL HIGH (ref 70–99)
Glucose-Capillary: 166 mg/dL — ABNORMAL HIGH (ref 70–99)

## 2018-05-19 LAB — PHOSPHORUS: Phosphorus: 3.7 mg/dL (ref 2.5–4.6)

## 2018-05-19 MED ORDER — MAGNESIUM SULFATE 2 GM/50ML IV SOLN
2.0000 g | Freq: Once | INTRAVENOUS | Status: AC
  Administered 2018-05-19: 2 g via INTRAVENOUS
  Filled 2018-05-19: qty 50

## 2018-05-19 MED ORDER — ONDANSETRON HCL 4 MG/2ML IJ SOLN: 4.0000 mg | Freq: Four times a day (QID) | INTRAMUSCULAR | Status: DC | PRN

## 2018-05-19 MED ORDER — SODIUM CHLORIDE 0.9 % IV BOLUS
500.0000 mL | Freq: Once | INTRAVENOUS | Status: AC
  Administered 2018-05-19: 500 mL via INTRAVENOUS

## 2018-05-19 MED ORDER — ONDANSETRON HCL 4 MG PO TABS: 4.0000 mg | ORAL_TABLET | Freq: Four times a day (QID) | ORAL | Status: DC | PRN

## 2018-05-19 MED ORDER — POTASSIUM CHLORIDE CRYS ER 20 MEQ PO TBCR
40.0000 meq | EXTENDED_RELEASE_TABLET | Freq: Once | ORAL | Status: AC
  Administered 2018-05-19: 40 meq via ORAL
  Filled 2018-05-19: qty 2

## 2018-05-19 MED ORDER — ACETAMINOPHEN 325 MG PO TABS: 650.0000 mg | ORAL_TABLET | Freq: Four times a day (QID) | ORAL | Status: DC | PRN

## 2018-05-19 MED ORDER — ACETAMINOPHEN 650 MG RE SUPP: 650.0000 mg | Freq: Four times a day (QID) | RECTAL | Status: DC | PRN

## 2018-05-19 MED ORDER — FUROSEMIDE 10 MG/ML IJ SOLN
80.0000 mg | Freq: Once | INTRAMUSCULAR | Status: AC
  Administered 2018-05-19: 80 mg via INTRAVENOUS
  Filled 2018-05-19: qty 8

## 2018-05-19 MED ORDER — ACETAZOLAMIDE 250 MG PO TABS
500.0000 mg | ORAL_TABLET | Freq: Once | ORAL | Status: AC
  Administered 2018-05-19: 500 mg via ORAL
  Filled 2018-05-19 (×2): qty 2

## 2018-05-19 MED ORDER — SODIUM CHLORIDE 0.9 % IV SOLN
INTRAVENOUS | Status: DC | PRN
  Administered 2018-05-19: 500 mL via INTRAVENOUS
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
## 2018-05-19 NOTE — ED Triage Notes (Signed)
Was d/c from this facility yesterday back to [facility] after a fall. Per SNF pt has been altered since arriving at facility yesterday. Pt able to state name, date, location correctly.

## 2018-05-19 NOTE — ED Notes (Signed)
Patient transported to X-ray 
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
## 2018-05-19 NOTE — Telephone Encounter (Signed)
Pt was on TCM report  admitted 05/16/18 with a fall but noted to have mild hypoxia and bilateral pleural effusions and pulmonary edema.  It was felt the patient had congestive heart failure exacerbation.  Patient was given IV diuresis for 2 days with improvement in hypoxia and cough. Patient was discharged 03/19/18 to ALF with his increase in torsemide to 20 mg twice daily.Will follow-up w/provider at facility.Marland KitchenRaechel Chute

## 2018-05-20 ENCOUNTER — Other Ambulatory Visit: Payer: Self-pay

## 2018-05-20 ENCOUNTER — Encounter (HOSPITAL_COMMUNITY): Payer: Self-pay | Admitting: Family Medicine

## 2018-05-20 DIAGNOSIS — K3184 Gastroparesis: Secondary | ICD-10-CM | POA: Diagnosis not present

## 2018-05-20 DIAGNOSIS — I482 Chronic atrial fibrillation, unspecified: Secondary | ICD-10-CM

## 2018-05-20 DIAGNOSIS — I5022 Chronic systolic (congestive) heart failure: Secondary | ICD-10-CM | POA: Diagnosis not present

## 2018-05-20 DIAGNOSIS — K210 Gastro-esophageal reflux disease with esophagitis: Secondary | ICD-10-CM

## 2018-05-20 DIAGNOSIS — J449 Chronic obstructive pulmonary disease, unspecified: Secondary | ICD-10-CM

## 2018-05-20 DIAGNOSIS — R4182 Altered mental status, unspecified: Secondary | ICD-10-CM | POA: Diagnosis not present

## 2018-05-20 DIAGNOSIS — E1143 Type 2 diabetes mellitus with diabetic autonomic (poly)neuropathy: Secondary | ICD-10-CM | POA: Diagnosis not present

## 2018-05-20 DIAGNOSIS — I10 Essential (primary) hypertension: Secondary | ICD-10-CM

## 2018-05-20 DIAGNOSIS — L8990 Pressure ulcer of unspecified site, unspecified stage: Secondary | ICD-10-CM

## 2018-05-20 LAB — COMPREHENSIVE METABOLIC PANEL
ALT: 17 U/L (ref 0–44)
AST: 31 U/L (ref 15–41)
Albumin: 2.7 g/dL — ABNORMAL LOW (ref 3.5–5.0)
Alkaline Phosphatase: 72 U/L (ref 38–126)
Anion gap: 9 (ref 5–15)
BUN: 16 mg/dL (ref 8–23)
CO2: 40 mmol/L — ABNORMAL HIGH (ref 22–32)
Calcium: 8.1 mg/dL — ABNORMAL LOW (ref 8.9–10.3)
Chloride: 92 mmol/L — ABNORMAL LOW (ref 98–111)
Creatinine, Ser: 0.93 mg/dL (ref 0.61–1.24)
GFR calc Af Amer: >60 mL/min (ref >60)
GFR calc non Af Amer: >60 mL/min (ref >60)
Glucose, Bld: 142 mg/dL — ABNORMAL HIGH (ref 70–99)
Potassium: 3.6 mmol/L (ref 3.5–5.1)
Sodium: 141 mmol/L (ref 135–145)
Total Bilirubin: 1.1 mg/dL (ref 0.3–1.2)
Total Protein: 5.5 g/dL — ABNORMAL LOW (ref 6.5–8.1)

## 2018-05-20 LAB — MRSA PCR SCREENING: MRSA by PCR: NEGATIVE

## 2018-05-20 LAB — GLUCOSE, CAPILLARY: Glucose-Capillary: 135 mg/dL — ABNORMAL HIGH (ref 70–99)

## 2018-05-20 LAB — HEMOGLOBIN A1C
Hgb A1c MFr Bld: 6.6 % — ABNORMAL HIGH (ref 4.8–5.6)
Mean Plasma Glucose: 142.72 mg/dL

## 2018-05-20 LAB — CBC
HCT: 40.2 % (ref 39.0–52.0)
Hemoglobin: 12.1 g/dL — ABNORMAL LOW (ref 13.0–17.0)
MCH: 29.4 pg (ref 26.0–34.0)
MCHC: 30.1 g/dL (ref 30.0–36.0)
MCV: 97.8 fL (ref 80.0–100.0)
Platelets: 160 10*3/uL (ref 150–400)
RBC: 4.11 MIL/uL — ABNORMAL LOW (ref 4.22–5.81)
RDW: 15.7 % — ABNORMAL HIGH (ref 11.5–15.5)
WBC: 5.6 10*3/uL (ref 4.0–10.5)
nRBC: 0 % (ref 0.0–0.2)

## 2018-05-20 MED ORDER — METOCLOPRAMIDE HCL 10 MG PO TABS
5.0000 mg | ORAL_TABLET | Freq: Four times a day (QID) | ORAL | Status: DC
  Administered 2018-05-20 (×2): 5 mg via ORAL
  Filled 2018-05-20 (×2): qty 1

## 2018-05-20 MED ORDER — FLUTICASONE PROPIONATE 50 MCG/ACT NA SUSP
2.0000 | Freq: Every day | NASAL | Status: DC
  Administered 2018-05-20: 2 via NASAL
  Filled 2018-05-20: qty 16

## 2018-05-20 MED ORDER — RIVAROXABAN 20 MG PO TABS
20.0000 mg | ORAL_TABLET | Freq: Every day | ORAL | Status: DC
  Administered 2018-05-20: 20 mg via ORAL
  Filled 2018-05-20: qty 1

## 2018-05-20 MED ORDER — NITROGLYCERIN 0.4 MG SL SUBL: 0.4000 mg | SUBLINGUAL_TABLET | SUBLINGUAL | Status: DC | PRN

## 2018-05-20 MED ORDER — LEVOCETIRIZINE DIHYDROCHLORIDE 5 MG PO TABS: 5.0000 mg | ORAL_TABLET | Freq: Every evening | ORAL | Status: DC

## 2018-05-20 MED ORDER — VILAZODONE HCL 40 MG PO TABS: 40.0000 mg | ORAL_TABLET | Freq: Every day | ORAL | Status: DC

## 2018-05-20 MED ORDER — GUAIFENESIN 100 MG/5ML PO SOLN: 5.0000 mL | Freq: Three times a day (TID) | ORAL | Status: DC | PRN

## 2018-05-20 MED ORDER — ONDANSETRON HCL 4 MG PO TABS: 4.0000 mg | ORAL_TABLET | Freq: Four times a day (QID) | ORAL | Status: DC | PRN

## 2018-05-20 MED ORDER — VILAZODONE HCL 20 MG PO TABS
40.0000 mg | ORAL_TABLET | Freq: Every day | ORAL | Status: DC
  Administered 2018-05-20: 40 mg via ORAL
  Filled 2018-05-20 (×3): qty 2

## 2018-05-20 MED ORDER — PANTOPRAZOLE SODIUM 40 MG PO TBEC
40.0000 mg | DELAYED_RELEASE_TABLET | Freq: Every day | ORAL | Status: DC
  Administered 2018-05-20: 40 mg via ORAL
  Filled 2018-05-20: qty 1

## 2018-05-20 MED ORDER — NYSTATIN 100000 UNIT/GM EX POWD
Freq: Three times a day (TID) | CUTANEOUS | Status: DC
  Administered 2018-05-20 (×2): via TOPICAL
  Filled 2018-05-20: qty 15

## 2018-05-20 MED ORDER — ACETAZOLAMIDE 250 MG PO TABS
250.0000 mg | ORAL_TABLET | Freq: Two times a day (BID) | ORAL | Status: DC
  Filled 2018-05-20 (×5): qty 1

## 2018-05-20 MED ORDER — VILAZODONE HCL 20 MG PO TABS: 40.0000 mg | ORAL_TABLET | Freq: Every day | ORAL | Status: DC

## 2018-05-20 MED ORDER — SIMVASTATIN 20 MG PO TABS: 20.0000 mg | ORAL_TABLET | Freq: Every morning | ORAL | Status: DC

## 2018-05-20 MED ORDER — TORSEMIDE 20 MG PO TABS
20.0000 mg | ORAL_TABLET | Freq: Two times a day (BID) | ORAL | Status: DC
  Administered 2018-05-20: 20 mg via ORAL
  Filled 2018-05-20: qty 1

## 2018-05-20 MED ORDER — POLYETHYLENE GLYCOL 3350 17 G PO PACK
17.0000 g | PACK | Freq: Every day | ORAL | Status: DC
  Filled 2018-05-20: qty 1

## 2018-05-20 MED ORDER — MIDODRINE HCL 5 MG PO TABS
5.0000 mg | ORAL_TABLET | Freq: Three times a day (TID) | ORAL | Status: DC
  Administered 2018-05-20 (×2): 5 mg via ORAL
  Filled 2018-05-20 (×3): qty 1

## 2018-05-20 MED ORDER — CARVEDILOL 3.125 MG PO TABS
3.1250 mg | ORAL_TABLET | Freq: Two times a day (BID) | ORAL | Status: DC
  Administered 2018-05-20 (×2): 3.125 mg via ORAL
  Filled 2018-05-20 (×2): qty 1

## 2018-05-20 MED ORDER — LORATADINE 10 MG PO TABS: 10.0000 mg | ORAL_TABLET | Freq: Every evening | ORAL | Status: DC

## 2018-05-20 MED ORDER — NYSTATIN 100000 UNIT/GM EX POWD: Freq: Three times a day (TID) | CUTANEOUS | 0 refills | Status: DC

## 2018-05-20 MED ORDER — OXYCODONE HCL 5 MG PO TABS: 2.5000 mg | ORAL_TABLET | Freq: Four times a day (QID) | ORAL | Status: DC | PRN

## 2018-05-20 MED ORDER — OXYCODONE HCL 5 MG PO TABS
5.0000 mg | ORAL_TABLET | Freq: Four times a day (QID) | ORAL | Status: DC | PRN
  Administered 2018-05-20: 5 mg via ORAL
  Filled 2018-05-20: qty 1

## 2018-05-20 MED ORDER — RISAQUAD PO CAPS
1.0000 | ORAL_CAPSULE | Freq: Two times a day (BID) | ORAL | Status: DC
  Administered 2018-05-20 (×2): 1 via ORAL
  Filled 2018-05-20 (×2): qty 1

## 2018-05-20 MED ORDER — GUAIFENESIN 100 MG/5ML PO SOLN: 10.0000 mL | Freq: Three times a day (TID) | ORAL | Status: DC | PRN

## 2018-05-20 MED ORDER — ADULT MULTIVITAMIN W/MINERALS CH
1.0000 | ORAL_TABLET | Freq: Every day | ORAL | Status: DC
  Administered 2018-05-20: 1 via ORAL
  Filled 2018-05-20: qty 1

## 2018-05-20 MED ORDER — SIMVASTATIN 20 MG PO TABS
20.0000 mg | ORAL_TABLET | Freq: Every morning | ORAL | Status: DC
  Administered 2018-05-20: 20 mg via ORAL
  Filled 2018-05-20: qty 1

## 2018-05-20 NOTE — Progress Notes (Signed)
Patient alert and oriented x4. No complaints of pain, shortness of breath, chest pain, dizziness, nausea or vomiting. Patient up out of bed to commode and ambulatory with walker with stand-by assist. Patient unsteady on feet at times. O2 noted to drop in the 70's during activity while on room air. Patient placed back on 3L [place] O2 with O2 sats improving and back up in the 90's. Patient tolerated PO meds and diet well. Appetite good. Foley cath dc'd without complications. Patient voided some urine in the urinal prior to discharge. Discharge summary gone over with patient and tech from [facility]. IV removed without complications. Patient discharged back to [facility] via car (staff from facility here to pick patient up).

## 2018-05-20 NOTE — Discharge Summary (Addendum)
Physician Discharge Summary  Cody Hale WGN:[id number] DOB: 1951/04/09 DOA: 05/19/2018  PCP: [name], MD  Admit date: 05/19/2018 Discharge date: 05/20/2018  Admitted From: ALF Disposition: ALF  Recommendations for Outpatient Follow-up:  1. Follow up with PCP in 1 weeks 2. Follow up with cardiology in 1-2 weeks 3. Please obtain BMP/CBC in 1-2 weeks to check electrolytes 4. Please follow up on the following pending results: Final culture data 5. PLEASE ARRANGE OUTPATIENT SLEEP STUDY SO PATIENT CAN GET A CPAP.  6. PLEASE WEIGH PATIENT EVERY 3 DAYS AND KEEP RECORD.  REPORT WEIGHT GAIN TO CARDIOLOGY 7. AVOID ALL SEDATIVES AND MUSCLE RELAXERS AND OPIOIDS.   Discharge Condition: STABLE   CODE STATUS: FULL   Brief Hospitalization Summary: Please see all hospital notes, images, labs for full details of the hospitalization. HPI: Cody Hale is a 67 y.o. male with medical history significant of atrial fibrillation, CAD, history of endocarditis, left bundle branch block, history of mitral valve replacement, chronic systolic heart failure, PSVT, COPD, chronic venous insufficiency, hypertension, type 2 diabetes, gastroparesis, anxiety, depression, cellulitis, morbid obesity, nephrolithiasis, sleep apnea (no longer on CPAP), history of Proteus UTI who was just discharged from the hospital yesterday due to acute on chronic systolic heart failure.  Apparently, the patient has been somnolent at the facility since he arrived.  When asked, the patient is oriented to name, place, time, date and partially oriented to situation.  Since discharge, he denies fever, chills, sore throat, wheezing, hemoptysis, headache, chest pain, palpitations, dizziness, diaphoresis, PND or orthopnea.  He has chronic left lower extremity edema, which is unchanged.  No abdominal pain, nausea, emesis, diarrhea, constipation, melena or hematochezia.  Denies dysuria, frequency or hematuria.  No polyuria, polydipsia or polyphagia.   Complains of skin rashes in his lower extremities.  ED Course: Initial vital signs temperature 97.9 F, pulse 67, respirations 17, blood pressure 130/59 mmHg and O2 sat 96% on nasal cannula oxygen.  He was given supplemental oxygen and 80 mg of furosemide.  His urinalysis was unremarkable, except for a hazy appearance with amber color.  White count 5.4, hemoglobin 11.8 g/dL and platelets 784.  Troponin has been 0.03 ng/mL x 2.  BNP 373.0 pg/mL.  Ammonia level was 18 mol/L.  Sodium is 137, potassium 3.6, chloride 92 and CO2 39 mmol/L.  BUN 19, creatinine 1.19 and glucose 179 mg/mL.  Imaging: Chest radiograph shows stable bibasilar atelectasis and effusions. This was also seen on CT chest.  There was dilatation of the main pulmonary artery to 3.8 cm consistent with chronic pulmonary hypertension.  Thoracolumbar spondylosis, coronary and aortic atherosclerosis was seen.     Altered mental status Suspect CO2 retention and may be meds side effects.  He has been on sedatives that he probably shouldn't be on.  Discontinued opioids and Discontinue Zanaflex.  Pt is awake and alert this morning and back to his baseline status.  Discharge back to ALF today but do not restart the sedatives.      OSA (obstructive sleep apnea) Per patient, he has not used his device in some time. PT SAYS NOT USING CPAP IN YEARS.  PLEASE ARRANGE OUTPATIENT SLEEP STUDY SO HE CAN GET BACK ON CPAP.      Chronic systolic heart failure (HCC) Continue torsemide and carvedilol.    Chronic atrial fibrillation CHA?DS?-VASc Score of at least 5. Continue carvedilol and Xarelto.    Gastroparesis due to DM  Continue metoclopramide and pantoprazole.    GERD with esophagitis Continue Reglan and  PPI.    Hypertension On low-dose carvedilol, but also taking Midodrin 5 mg p.o. 3 times daily.  blood pressure remained stable.    B12 deficiency anemia Continue B12 supplementation as scheduled.    COPD (chronic  obstructive pulmonary disease) with chronic bronchitis (HCC) Doing supplemental oxygen. Bronchodilators as needed.    Major depressive disorder, recurrent severe without psychotic features (HCC) Continue vilazodone 20 mg p.o. twice daily.  DVT prophylaxis: On Xarelto. Code Status: Full code Family Communication: family members at bedside Disposition Plan: return to ALF Consults called:  Admission status:   Discharge Diagnoses:  Principal Problem:   Altered mental status Active Problems:   OSA (obstructive sleep apnea)   Gastroparesis due to DM (HCC)   Hypertension   B12 deficiency anemia   COPD (chronic obstructive pulmonary disease) with chronic bronchitis (HCC)   Chronic systolic heart failure (HCC)   Chronic atrial fibrillation   GERD with esophagitis   Major depressive disorder, recurrent severe without psychotic features (HCC)   Pressure injury of skin  Discharge Instructions: Discharge Instructions    (HEART FAILURE PATIENTS) Call MD:  Anytime you have any of the following symptoms: 1) 3 pound weight gain in 24 hours or 5 pounds in 1 week 2) shortness of breath, with or without a dry hacking cough 3) swelling in the hands, feet or stomach 4) if you have to sleep on extra pillows at night in order to breathe.   Complete by:  As directed    Call MD for:  difficulty breathing, headache or visual disturbances   Complete by:  As directed    Call MD for:  extreme fatigue   Complete by:  As directed    Call MD for:  persistant dizziness or light-headedness   Complete by:  As directed    Call MD for:  persistant nausea and vomiting   Complete by:  As directed    Call MD for:  severe uncontrolled pain   Complete by:  As directed    Diet - low sodium heart healthy   Complete by:  As directed    Increase activity slowly   Complete by:  As directed      Allergies as of 05/20/2018      Reactions   Daptomycin Rash   Lisinopril Cough   Tape Rash, Other (See Comments)    Adhesive Tape-Burn skin.      Medication List    STOP taking these medications   oxyCODONE 5 MG immediate release tablet Commonly known as:  Oxy IR/ROXICODONE   tiZANidine 4 MG tablet Commonly known as:  ZANAFLEX     TAKE these medications   carvedilol 3.125 MG tablet Commonly known as:  COREG Take 1 tablet (3.125 mg total) by mouth 2 (two) times daily.   DIPHENOXYLATE-ATROPINE PO Take 1 tablet by mouth 4 (four) times daily as needed. Take 1 tablet by mouth 4 times daily as needed for diarrhea or loose stool   fluticasone 50 MCG/ACT nasal spray Commonly known as:  FLONASE Place 2 sprays into both nostrils daily.   guaiFENesin 200 MG/10ML Soln Take 5 mLs by mouth 3 (three) times daily as needed (cough).   levocetirizine 5 MG tablet Commonly known as:  XYZAL Take 1 tablet (5 mg total) by mouth every evening.   metoCLOPramide 5 MG tablet Commonly known as:  REGLAN Take 1 tablet (5 mg total) by mouth 4 (four) times daily.   midodrine 10 MG tablet Commonly known as:  PROAMATINE Take  0.5 tablets (5 mg total) by mouth 3 (three) times daily.   multivitamin tablet Take 1 tablet by mouth daily.   nitroGLYCERIN 0.4 MG SL tablet Commonly known as:  NITROSTAT Place 1 tablet (0.4 mg total) under the tongue every 5 (five) minutes as needed for chest pain. Max 3 doses.   nystatin powder Commonly known as:  MYCOSTATIN/NYSTOP Apply topically 3 (three) times daily. APPLY TO GROIN AREA   ondansetron 4 MG tablet Commonly known as:  ZOFRAN Take 4 mg by mouth every 6 (six) hours as needed for nausea or vomiting.   pantoprazole 40 MG tablet Commonly known as:  PROTONIX Take 1 tablet (40 mg total) by mouth daily.   polyethylene glycol packet Commonly known as:  MIRALAX / GLYCOLAX Take 17 g by mouth daily.   RISA-BID PROBIOTIC Tabs Take 1 tablet by mouth 2 (two) times daily.   simvastatin 20 MG tablet Commonly known as:  ZOCOR Take 1 tablet (20 mg total) by mouth every  morning.   torsemide 20 MG tablet Commonly known as:  DEMADEX Take 1 tablet (20 mg total) by mouth 2 (two) times daily.   Vilazodone HCl 40 MG Tabs Commonly known as:  VIIBRYD Take 1 tablet (40 mg total) by mouth daily.   XARELTO 20 MG Tabs tablet Generic drug:  rivaroxaban TAKE 1 TABLET BY MOUTH ONCE DAILY. What changed:  how much to take      Follow-up Information    [name], MD. Schedule an appointment as soon as possible for a visit in 1 week(s).   Specialty:  Internal Medicine Contact information: [address] [place] [address] [phone]        Laqueta Linden, MD. Schedule an appointment as soon as possible for a visit in 1 week(s).   Specialty:  Cardiology Why:  Hospital Follow Up  Contact information: [address] [place] [address] [phone]          Allergies  Allergen Reactions  . Daptomycin Rash  . Lisinopril Cough  . Tape Rash and Other (See Comments)    Adhesive Tape-Burn skin.   Allergies as of 05/20/2018      Reactions   Daptomycin Rash   Lisinopril Cough   Tape Rash, Other (See Comments)   Adhesive Tape-Burn skin.      Medication List    STOP taking these medications   oxyCODONE 5 MG immediate release tablet Commonly known as:  Oxy IR/ROXICODONE   tiZANidine 4 MG tablet Commonly known as:  ZANAFLEX     TAKE these medications   carvedilol 3.125 MG tablet Commonly known as:  COREG Take 1 tablet (3.125 mg total) by mouth 2 (two) times daily.   DIPHENOXYLATE-ATROPINE PO Take 1 tablet by mouth 4 (four) times daily as needed. Take 1 tablet by mouth 4 times daily as needed for diarrhea or loose stool   fluticasone 50 MCG/ACT nasal spray Commonly known as:  FLONASE Place 2 sprays into both nostrils daily.   guaiFENesin 200 MG/10ML Soln Take 5 mLs by mouth 3 (three) times daily as needed (cough).   levocetirizine 5 MG tablet Commonly known as:  XYZAL Take 1 tablet (5 mg total) by mouth  every evening.   metoCLOPramide 5 MG tablet Commonly known as:  REGLAN Take 1 tablet (5 mg total) by mouth 4 (four) times daily.   midodrine 10 MG tablet Commonly known as:  PROAMATINE Take 0.5 tablets (5 mg total) by mouth 3 (  three) times daily.   multivitamin tablet Take 1 tablet by mouth daily.   nitroGLYCERIN 0.4 MG SL tablet Commonly known as:  NITROSTAT Place 1 tablet (0.4 mg total) under the tongue every 5 (five) minutes as needed for chest pain. Max 3 doses.   nystatin powder Commonly known as:  MYCOSTATIN/NYSTOP Apply topically 3 (three) times daily. APPLY TO GROIN AREA   ondansetron 4 MG tablet Commonly known as:  ZOFRAN Take 4 mg by mouth every 6 (six) hours as needed for nausea or vomiting.   pantoprazole 40 MG tablet Commonly known as:  PROTONIX Take 1 tablet (40 mg total) by mouth daily.   polyethylene glycol packet Commonly known as:  MIRALAX / GLYCOLAX Take 17 g by mouth daily.   RISA-BID PROBIOTIC Tabs Take 1 tablet by mouth 2 (two) times daily.   simvastatin 20 MG tablet Commonly known as:  ZOCOR Take 1 tablet (20 mg total) by mouth every morning.   torsemide 20 MG tablet Commonly known as:  DEMADEX Take 1 tablet (20 mg total) by mouth 2 (two) times daily.   Vilazodone HCl 40 MG Tabs Commonly known as:  VIIBRYD Take 1 tablet (40 mg total) by mouth daily.   XARELTO 20 MG Tabs tablet Generic drug:  rivaroxaban TAKE 1 TABLET BY MOUTH ONCE DAILY. What changed:  how much to take       Procedures/Studies: Dg Chest 1 View  Result Date: 05/19/2018 CLINICAL DATA:  Altered level of consciousness EXAM: CHEST  1 VIEW COMPARISON:  05/16/2018 FINDINGS: Cardiac shadow is enlarged. Postsurgical changes are again seen. Bibasilar atelectasis and effusions are again identified and stable. No new focal infiltrate is seen. No bony abnormality is noted. IMPRESSION: Stable bibasilar atelectasis and effusions. Electronically Signed   By: Alcide Clever M.D.   On:  05/19/2018 14:21   Dg Chest 2 View  Result Date: 05/16/2018 CLINICAL DATA:  Fall this morning. EXAM: CHEST - 2 VIEW COMPARISON:  04/17/2018 FINDINGS: Stable enlargement of the cardiac silhouette with median sternotomy wires. Persistent densities at the left lung base compatible with pleural fluid and atelectasis. Increased densities at the right lung base and suggestive for pleural fluid with atelectasis or airspace disease. Negative for a pneumothorax. No acute bone abnormality. IMPRESSION: Bibasilar chest densities compatible with bilateral pleural effusions with atelectasis or airspace disease. Basilar chest densities have progressed, particularly on the right side compared to 04/17/2018. Electronically Signed   By: Richarda Overlie M.D.   On: 05/16/2018 09:28   Dg Shoulder Right  Result Date: 05/16/2018 CLINICAL DATA:  Fall this morning with right shoulder pain. EXAM: RIGHT SHOULDER - 2+ VIEW COMPARISON:  Chest x-ray 04/12/2018 FINDINGS: Mild degenerative change of the [facility] joint and glenohumeral joints. No evidence of acute fracture or dislocation involving the shoulder. Several old right lateral rib fractures. IMPRESSION: No acute shoulder injury. Old right rib fractures. Electronically Signed   By: Elberta Fortis M.D.   On: 05/16/2018 08:25   Dg Elbow Complete Right  Result Date: 05/16/2018 CLINICAL DATA:  Fall this morning with right elbow pain. EXAM: RIGHT ELBOW - COMPLETE 3+ VIEW COMPARISON:  None. FINDINGS: Mild degenerate change of the right elbow joint. No evidence of acute fracture or dislocation. IMPRESSION: No acute findings. Electronically Signed   By: Elberta Fortis M.D.   On: 05/16/2018 08:26   Dg Wrist Complete Right  Result Date: 05/16/2018 CLINICAL DATA:  Fall this morning with right wrist pain. EXAM: RIGHT WRIST - COMPLETE 3+ VIEW COMPARISON:  None. FINDINGS: Mild degenerate changes over the radiocarpal joint and carpal bones as well as first carpometacarpal joint and first MCP joints.  No evidence of acute fracture or dislocation. IMPRESSION: No acute findings. Electronically Signed   By: Elberta Fortis M.D.   On: 05/16/2018 08:27   Ct Head Wo Contrast  Result Date: 05/19/2018 CLINICAL DATA:  Recent fall. EXAM: CT HEAD WITHOUT CONTRAST TECHNIQUE: Contiguous axial images were obtained from the base of the skull through the vertex without intravenous contrast. COMPARISON:  05/16/2018; 03/12/2015; 08/24/2012 FINDINGS: Brain: Gray-white differentiation is maintained. No CT evidence of acute large territory infarct. 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 Otherwise, no intraparenchymal or extra-axial mass. No intraparenchymal or extra-axial hemorrhage. Normal size and configuration of the ventricles and the basilar cisterns. No midline shift. Vascular: Intracranial atherosclerosis. Skull: No displaced calvarial fracture. Sinuses/Orbits: Limited visualization the paranasal sinuses and mastoid air cells is normal. No air-fluid levels. Other: Regional soft tissues appear normal. IMPRESSION: 1. Negative noncontrast head CT. 2. 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   On: 05/19/2018 19:40   Ct Head Wo Contrast  Result Date: 05/16/2018 CLINICAL DATA:  Fall transitioning from bed to wheelchair. Head trauma. EXAM: CT HEAD WITHOUT CONTRAST CT CERVICAL SPINE WITHOUT CONTRAST TECHNIQUE: Multidetector CT imaging of the head and cervical spine was performed following the standard protocol without intravenous contrast. Multiplanar CT image reconstructions of the cervical spine were also generated. COMPARISON:  03/12/2015 FINDINGS: CT HEAD FINDINGS Brain: 4 mm colloid cyst on image 14/3 without ventricular dilatation. Otherwise, the brainstem, cerebellum,  cerebral peduncles, thalami, basal ganglia, basilar cisterns, and ventricular system appear within normal limits. No intracranial hemorrhage, mass lesion, or acute CVA. Vascular: Atherosclerotic calcification of the left vertebral artery and of the cavernous carotid arteries. Skull: Unremarkable Sinuses/Orbits: Unremarkable Other: No supplemental non-categorized findings. CT CERVICAL SPINE FINDINGS Alignment: No vertebral subluxation is observed. Skull base and vertebrae: Extensive spur formation in the cervical spine with continuous bridging spurring at C4-C5-C6-C7-T1, and non continuous spurring anteriorly at C2-3 and C3-4 with chronic fragmentation. There is bridging spurring of the left C4-5 facet joint. No fracture observed. Soft tissues and spinal canal: Atherosclerotic calcification of the common carotid arteries. Disc levels: Severe central narrowing of the thecal sac at C3-4 due to posterior intervertebral spurring as on image 32/9, with resulting prominent central narrowing of the thecal sac Right foraminal impingement due to spurring at C2-3 and possibly C4-5. Upper chest: Suspected pleural effusions of the lung apices. Other: No supplemental non-categorized findings. IMPRESSION: 1. No acute intracranial findings or acute cervical spine findings. 2. Suspected bilateral pleural effusions, dedicated chest radiography recommended. 3. 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 4. Diffuse idiopathic skeletal hyperostosis with prominent spurring in the cervical spine. There is severe central narrowing of the thecal sac at C3-4 due to a large posterior intervertebral spur, as well as suspected right foraminal impingement at C2-3 and C4-5. 5. Atherosclerosis. Electronically Signed   By: Gaylyn Rong M.D.   On: 05/16/2018 08:47   Ct Chest Wo Contrast  Result Date: 05/19/2018 CLINICAL DATA:  Chest pain and dyspnea. Pleurisy. EXAM:  CT CHEST WITHOUT CONTRAST TECHNIQUE: Multidetector CT imaging of the chest was performed following the standard protocol without IV contrast. COMPARISON:  CXR 05/19/2018 FINDINGS: Cardiovascular: Atherosclerosis of the great vessels with conventional branching pattern. Moderate atherosclerosis of the thoracic aorta without aneurysm. Dilatation of the main pulmonary artery to 3.8 cm consistent with chronic pulmonary hypertension. Left main and three-vessel coronary arteriosclerosis is identified. Status post mitral valvular repair. Median sternotomy sutures are noted. No pericardial effusion or thickening. Heart size is enlarged. Mediastinum/Nodes: No enlarged mediastinal or axillary lymph nodes. Thyroid gland, trachea, and esophagus demonstrate no significant findings. Lungs/Pleura: Moderate bilateral pleural effusions with compressive atelectasis. Faint ground-glass opacities in the aerated lungs bilaterally compatible with stigmata of CHF. Upper Abdomen: No acute abnormality. Musculoskeletal: No chest wall mass or suspicious bone lesions identified. Spondylosis is noted of the included lower cervical, thoracic and upper lumbar spine. IMPRESSION: 1. Moderate bilateral pleural effusions with compressive atelectasis. 2. Dilatation of the main pulmonary artery to 3.8 cm consistent with chronic pulmonary hypertension. 3. Thoracolumbar spondylosis. 4. Coronary arteriosclerosis and aortic atherosclerosis. Aortic Atherosclerosis (ICD10-I70.0). Electronically Signed   By: Tollie Eth M.D.   On: 05/19/2018 16:13   Ct Cervical Spine Wo Contrast  Result Date: 05/16/2018 CLINICAL DATA:  Fall transitioning from bed to wheelchair. Head trauma. EXAM: CT HEAD WITHOUT CONTRAST CT CERVICAL SPINE WITHOUT CONTRAST TECHNIQUE: Multidetector CT imaging of the head and cervical spine was performed following the standard protocol without intravenous contrast. Multiplanar CT image reconstructions of the cervical spine were also  generated. COMPARISON:  03/12/2015 FINDINGS: CT HEAD FINDINGS Brain: 4 mm colloid cyst on image 14/3 without ventricular dilatation. Otherwise, the brainstem, cerebellum, cerebral peduncles, thalami, basal ganglia, basilar cisterns, and ventricular system appear within normal limits. No intracranial hemorrhage, mass lesion, or acute CVA. Vascular: Atherosclerotic calcification of the left vertebral artery and of the cavernous carotid arteries. Skull: Unremarkable Sinuses/Orbits: Unremarkable Other: No supplemental non-categorized findings. CT CERVICAL SPINE FINDINGS Alignment: No vertebral subluxation is observed. Skull base and vertebrae: Extensive spur formation in the cervical spine with continuous bridging spurring at C4-C5-C6-C7-T1, and non continuous spurring anteriorly at C2-3 and C3-4 with chronic fragmentation. There is bridging spurring of the left C4-5 facet joint. No fracture observed. Soft tissues and spinal canal: Atherosclerotic calcification of the common carotid arteries. Disc levels: Severe central narrowing of the thecal sac at C3-4 due to posterior intervertebral spurring as on image 32/9, with resulting prominent central narrowing of the thecal sac Right foraminal impingement due to spurring at C2-3 and possibly C4-5. Upper chest: Suspected pleural effusions of the lung apices. Other: No supplemental non-categorized findings. IMPRESSION: 1. No acute intracranial findings or acute cervical spine findings. 2. Suspected bilateral pleural effusions, dedicated chest radiography recommended. 3. 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 4. Diffuse idiopathic skeletal hyperostosis with prominent spurring in the cervical spine. There is severe central narrowing of the thecal sac at C3-4 due to a large posterior intervertebral spur, as well as suspected right foraminal impingement at C2-3 and C4-5. 5. Atherosclerosis.  Electronically Signed   By: Gaylyn Rong M.D.   On: 05/16/2018 08:47   Dg Knee Complete 4 Views Right  Result Date: 05/16/2018 CLINICAL DATA:  Fall this morning with right knee pain EXAM: RIGHT KNEE - COMPLETE 4+ VIEW COMPARISON:  None. FINDINGS: Mild tricompartmental osteoarthritic changes present no acute fracture or dislocation. No significant joint effusion. IMPRESSION: No acute fracture. Mild osteoarthritis. Electronically Signed   By: Elberta Fortis M.D.   On: 05/16/2018 08:23   Dg Hip Unilat With Pelvis 2-3 Views Right  Result Date: 05/16/2018 CLINICAL DATA:  Fall this morning with right hip pain. EXAM: DG HIP (WITH OR WITHOUT PELVIS) 2-3V RIGHT COMPARISON:  01/16/2018 FINDINGS: Right total hip arthroplasty intact and normally located. No evidence of acute fracture or dislocation. Degenerative change of the left hip. Degenerative change of the spine. Mild diffuse osteopenia. IMPRESSION: No acute findings. 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 Electronically Signed   By: Elberta Fortis M.D.   On: 05/16/2018 08:22      Subjective: PATIENT AWAKE, ALERT, ORIENTED IN NO DISTRESS, WANTS TO EAT.   Discharge Exam: Vitals:   05/20/18 1101 05/20/18 1141  BP:    Pulse:    Resp:    Temp:  97.7 F (36.5 C)  SpO2: 99%    Vitals:   05/20/18 1006 05/20/18 1100 05/20/18 1101 05/20/18 1141  BP: 120/62 122/61    Pulse: 70 84    Resp:  (!) 21    Temp:    97.7 F (36.5 C)  TempSrc:    Oral  SpO2:  (!) 82% 99%   Weight:      Height:       Constitutional: NAD, calm, comfortable Eyes: PERRL, lids and conjunctivae normal ENMT: Mucous membranes are moist. Posterior pharynx clear of any exudate or lesions. Neck: Normal, supple, no masses, no thyromegaly Respiratory: Decreased breath sounds on bases, otherwise clear to auscultation bilaterally, no wheezing, no crackles. Normal respiratory effort. No accessory muscle use.  Cardiovascular: Regular rate and rhythm, no murmurs /  rubs / gallops. No extremity edema. 2+ pedal pulses. No carotid bruits.  Abdomen: Obese, soft, no tenderness, no masses palpated. No hepatosplenomegaly. Bowel sounds positive.  Musculoskeletal: no clubbing / cyanosis. Good ROM, no contractures. Normal muscle tone.  Skin: Erythema on lower abdominal and inguinal folds.  This is likely YEAST INFECTION. Neurologic: CN 2-12 grossly intact. Sensation intact, DTR normal. Strength 5/5 in all 4.  Psychiatric:  alert and oriented x4.  The results of significant diagnostics from this hospitalization (including imaging, microbiology, ancillary and laboratory) are listed below for reference.     Microbiology: Recent Results (from the past 240 hour(s))  MRSA PCR Screening     Status: None   Collection Time: 05/19/18 11:54 PM  Result Value Ref Range Status   MRSA by PCR NEGATIVE NEGATIVE Final    Comment:        The GeneXpert MRSA Assay (FDA approved for NASAL specimens only), is one component of a comprehensive MRSA colonization surveillance program. It is not intended to diagnose MRSA infection nor to guide or monitor treatment for MRSA infections. Performed at [facility], [address] [place], [place] [address]      Labs: BNP (last 3 results) Recent Labs    04/17/18 1314 05/16/18 0956 05/19/18 1308  BNP 481.0* 404.0* 373.0*   Basic Metabolic Panel: Recent Labs  Lab 05/16/18 0956 05/17/18 0702 05/18/18 0518 05/19/18 1308 05/19/18 2033 05/20/18 0420  NA 143 146* 145 138 137 141  K 2.8* 3.5 3.8 3.6 3.6 3.6  CL 93* 94* 94* 92* 92* 92*  CO2 41* 40* 40* 39* 39* 40*  GLUCOSE 176* 212* 168* 180* 179* 142*  BUN 7* 9 13 19 19 16   CREATININE 0.78 0.93 1.26* 1.18 1.19 0.93  CALCIUM 8.3* 8.8* 8.8* 8.1* 8.0* 8.1*  MG 1.7  --  2.1  --  1.7  --   PHOS  --   --   --   --  3.7  --  Liver Function Tests: Recent Labs  Lab 05/19/18 1308 05/20/18 0420  AST 32 31  ALT 19 17  ALKPHOS 73 72  BILITOT 1.0 1.1  PROT 5.4* 5.5*   ALBUMIN 2.7* 2.7*   No results for input(s): LIPASE, AMYLASE in the last 168 hours. Recent Labs  Lab 05/19/18 1537  AMMONIA 18   CBC: Recent Labs  Lab 05/16/18 0956 05/19/18 1308 05/20/18 0420  WBC 5.9 5.4 5.6  NEUTROABS 4.5  --   --   HGB 13.1 11.8* 12.1*  HCT 42.5 39.1 40.2  MCV 93.4 96.3 97.8  PLT 216 156 160   Cardiac Enzymes: Recent Labs  Lab 05/16/18 0956 05/19/18 1308 05/19/18 2033  TROPONINI 0.04* 0.03* 0.03*   BNP: Invalid input(s): POCBNP CBG: Recent Labs  Lab 05/18/18 0729 05/18/18 1140 05/19/18 1254 05/19/18 2218 05/20/18 0803  GLUCAP 157* 168* 172* 166* 135*   D-Dimer No results for input(s): DDIMER in the last 72 hours. Hgb A1c Recent Labs    05/20/18 0420  HGBA1C 6.6*   Lipid Profile No results for input(s): CHOL, HDL, LDLCALC, TRIG, CHOLHDL, LDLDIRECT in the last 72 hours. Thyroid function studies No results for input(s): TSH, T4TOTAL, T3FREE, THYROIDAB in the last 72 hours.  Invalid input(s): FREET3 Anemia work up No results for input(s): VITAMINB12, FOLATE, FERRITIN, TIBC, IRON, RETICCTPCT in the last 72 hours. Urinalysis    Component Value Date/Time   COLORURINE AMBER (A) 05/19/2018 1537   APPEARANCEUR HAZY (A) 05/19/2018 1537   APPEARANCEUR Clear 07/23/2017   LABSPEC 1.014 05/19/2018 1537   PHURINE 5.0 05/19/2018 1537   GLUCOSEU NEGATIVE 05/19/2018 1537   GLUCOSEU 500 (A) 07/14/2017 1053   HGBUR NEGATIVE 05/19/2018 1537   BILIRUBINUR NEGATIVE 05/19/2018 1537   BILIRUBINUR Negative 07/23/2017   KETONESUR NEGATIVE 05/19/2018 1537   PROTEINUR NEGATIVE 05/19/2018 1537   UROBILINOGEN 0.2 07/14/2017 1053   NITRITE NEGATIVE 05/19/2018 1537   LEUKOCYTESUR NEGATIVE 05/19/2018 1537   LEUKOCYTESUR Negative (A) 07/23/2017   Sepsis Labs Invalid input(s): PROCALCITONIN,  WBC,  LACTICIDVEN Microbiology Recent Results (from the past 240 hour(s))  MRSA PCR Screening     Status: None   Collection Time: 05/19/18 11:54 PM  Result  Value Ref Range Status   MRSA by PCR NEGATIVE NEGATIVE Final    Comment:        The GeneXpert MRSA Assay (FDA approved for NASAL specimens only), is one component of a comprehensive MRSA colonization surveillance program. It is not intended to diagnose MRSA infection nor to guide or monitor treatment for MRSA infections. Performed at [facility], [address] [place], [place] [address]    Time coordinating discharge:   SIGNED:  Standley Dakins, MD  [facility] 05/20/2018, 2:01 PM Pager [phone]  If 7PM-7AM, please contact night-coverage [email or web address] Password [email or web address]

## 2018-05-20 NOTE — Evaluation (Signed)
Physical Therapy Evaluation Patient Details Name: Cody Hale MRN: [id number] DOB: Jan 14, 1951 Today's Date: 05/20/2018   History of Present Illness  Cody Hale is a 67 y.o. male with medical history significant of atrial fibrillation, CAD, history of endocarditis, left bundle branch block, history of mitral valve replacement, chronic systolic heart failure, PSVT, COPD, chronic venous insufficiency, hypertension, type 2 diabetes, gastroparesis, anxiety, depression, cellulitis, morbid obesity, nephrolithiasis, sleep apnea (no longer on CPAP), history of Proteus UTI who was just discharged from the hospital yesterday due to acute on chronic systolic heart failure.  Apparently, the patient has been somnolent at the facility since he arrived.  When asked, the patient is oriented to name, place, time, date and partially oriented to situation.  Since discharge, he denies fever, chills, sore throat, wheezing, hemoptysis, headache, chest pain, palpitations, dizziness, diaphoresis, PND or orthopnea.  He has chronic left lower extremity edema, which is unchanged.  No abdominal pain, nausea, emesis, diarrhea, constipation, melena or hematochezia.  Denies dysuria, frequency or hematuria.  No polyuria, polydipsia or polyphagia.  Complains of skin rashes in his lower extremities.    Clinical Impression  Patient functioning near baseline for functional mobility and gait, mostly limited due to O2 desaturation with exertion requiring the use of 3 LPM to keep O2 saturation above 90%, desaturated below 80% on room air.  Patient able to ambulate in room and hallway without loss of balance and tolerated sitting up in chair after therapy.  Patient to be discharged from hospital today.  Plan:  Patient discharged from physical therapy to care of nursing for ambulation daily as tolerated for length of stay.     Follow Up Recommendations Home health PT;Supervision/Assistance - 24 hour;Supervision for mobility/OOB     Equipment Recommendations  None recommended by PT    Recommendations for Other Services       Precautions / Restrictions Precautions Precautions: Fall Restrictions Weight Bearing Restrictions: No      Mobility  Bed Mobility Overal bed mobility: Needs Assistance Bed Mobility: Sit to Supine       Sit to supine: Min guard   General bed mobility comments: slow slightly labored movement  Transfers Overall transfer level: Needs assistance Equipment used: Rolling walker (2 wheeled) Transfers: Sit to/from [facility] Sit to Stand: Min guard Stand pivot transfers: Min guard       General transfer comment: labored movement  Ambulation/Gait Ambulation/Gait assistance: Min guard Gait Distance (Feet): 45 Feet Assistive device: Rolling walker (2 wheeled) Gait Pattern/deviations: Decreased step length - right;Decreased step length - left;Decreased stride length Gait velocity: decreased   General Gait Details: slow slightly labored cadence without loss of balance, on 3 LPM with O2 saturation at 91%, limited secondary to fatigue  Stairs            Wheelchair Mobility    Modified Rankin (Stroke Patients Only)       Balance Overall balance assessment: Needs assistance Sitting-balance support: Feet supported;No upper extremity supported Sitting balance-Leahy Scale: Good     Standing balance support: Bilateral upper extremity supported;During functional activity Standing balance-Leahy Scale: Fair Standing balance comment: using RW                             Pertinent Vitals/Pain Pain Assessment: No/denies pain    Home Living Family/patient expects to be discharged to:: Assisted living                 Additional Comments:  has access to DME at ALF    Prior Function Level of Independence: Needs assistance   Gait / Transfers Assistance Needed: household ambulation pushing wheelchair or using RW  ADL's / Homemaking  Assistance Needed: assisted by ALF staff        Hand Dominance        Extremity/Trunk Assessment   Upper Extremity Assessment Upper Extremity Assessment: Generalized weakness    Lower Extremity Assessment Lower Extremity Assessment: Generalized weakness    Cervical / Trunk Assessment Cervical / Trunk Assessment: Normal  Communication   Communication: No difficulties  Cognition   Behavior During Therapy: WFL for tasks assessed/performed Overall Cognitive Status: Within Functional Limits for tasks assessed                                        General Comments      Exercises     Assessment/Plan    PT Assessment All further PT needs can be met in the next venue of care  PT Problem List Decreased strength;Decreased activity tolerance;Decreased mobility;Decreased balance       PT Treatment Interventions      PT Goals (Current goals can be found in the Care Plan section)  Acute Rehab PT Goals Patient Stated Goal: return to ALF PT Goal Formulation: With patient Time For Goal Achievement: 05/20/18 Potential to Achieve Goals: Good    Frequency     Barriers to discharge        Co-evaluation               AM-PAC PT "6 Clicks" Daily Activity  Outcome Measure Difficulty turning over in bed (including adjusting bedclothes, sheets and blankets)?: None Difficulty moving from lying on back to sitting on the side of the bed? : A Little Difficulty sitting down on and standing up from a chair with arms (e.g., wheelchair, bedside commode, etc,.)?: Unable Help needed moving to and from a bed to chair (including a wheelchair)?: A Little Help needed walking in hospital room?: A Little Help needed climbing 3-5 steps with a railing? : A Lot 6 Click Score: 16    End of Session Equipment Utilized During Treatment: Gait belt;Oxygen Activity Tolerance: Patient tolerated treatment well;Patient limited by fatigue Patient left: in chair;with call  bell/phone within reach Nurse Communication: Mobility status PT Visit Diagnosis: Unsteadiness on feet (R26.81);Other abnormalities of gait and mobility (R26.89);Muscle weakness (generalized) (M62.81)    Time: 1029-1100 PT Time Calculation (min) (ACUTE ONLY): 31 min   Charges:   PT Evaluation $PT Eval Moderate Complexity: 1 Mod PT Treatments $Therapeutic Activity: 23-37 mins        2:33 PM, 05/20/18 Lonell Grandchild, MPT Physical Therapist with [facility] [phone] office [phone] mobile phone

## 2018-05-20 NOTE — Discharge Instructions (Signed)
PLEASE DON'T GIVE PATIENT ANY NARCOTIC OR MUSCLE RELAXERS AS IT CAUSES HIM SEVERE CONFUSION.    PLEASE ARRANGE FOR OUTPATIENT SLEEP STUDY SO PATIENT CAN START USING CPAP AGAIN.     1. PLEASE WEIGH PATIENT EVERY 3 DAYS AND KEEP RECORD.  REPORT WEIGHT GAIN TO CARDIOLOGY.

## 2018-05-20 NOTE — Care Management (Addendum)
Patient from [facility], active with [facility] for RN and PT. Marcelino Duster of [facility] aware.  Seen by PT-recommending to continue home health.   Patient has O2 that was setup 05/18/2018. [facility] to transport patient and bring O2 tank.

## 2018-05-21 ENCOUNTER — Inpatient Hospital Stay (HOSPITAL_COMMUNITY)
Admission: EM | Admit: 2018-05-21 | Discharge: 2018-05-26 | DRG: 189 | Disposition: A | Discharge status: Home / Self Care | Payer: Medicare Other | Attending: Internal Medicine | Admitting: Internal Medicine

## 2018-05-21 ENCOUNTER — Emergency Department (HOSPITAL_COMMUNITY): Payer: Medicare Other

## 2018-05-21 ENCOUNTER — Encounter (HOSPITAL_COMMUNITY): Payer: Self-pay

## 2018-05-21 ENCOUNTER — Other Ambulatory Visit: Payer: Self-pay

## 2018-05-21 DIAGNOSIS — Z888 Allergy status to other drugs, medicaments and biological substances status: Secondary | ICD-10-CM

## 2018-05-21 DIAGNOSIS — I110 Hypertensive heart disease with heart failure: Secondary | ICD-10-CM | POA: Diagnosis not present

## 2018-05-21 DIAGNOSIS — R400 Somnolence: Secondary | ICD-10-CM | POA: Diagnosis not present

## 2018-05-21 DIAGNOSIS — I10 Essential (primary) hypertension: Secondary | ICD-10-CM | POA: Diagnosis not present

## 2018-05-21 DIAGNOSIS — I482 Chronic atrial fibrillation, unspecified: Secondary | ICD-10-CM | POA: Diagnosis present

## 2018-05-21 DIAGNOSIS — Z881 Allergy status to other antibiotic agents status: Secondary | ICD-10-CM

## 2018-05-21 DIAGNOSIS — J9611 Chronic respiratory failure with hypoxia: Secondary | ICD-10-CM

## 2018-05-21 DIAGNOSIS — E66812 Obesity, class 2: Secondary | ICD-10-CM

## 2018-05-21 DIAGNOSIS — E1165 Type 2 diabetes mellitus with hyperglycemia: Secondary | ICD-10-CM | POA: Diagnosis present

## 2018-05-21 DIAGNOSIS — R5383 Other fatigue: Secondary | ICD-10-CM | POA: Diagnosis not present

## 2018-05-21 DIAGNOSIS — I2510 Atherosclerotic heart disease of native coronary artery without angina pectoris: Secondary | ICD-10-CM | POA: Diagnosis present

## 2018-05-21 DIAGNOSIS — F332 Major depressive disorder, recurrent severe without psychotic features: Secondary | ICD-10-CM | POA: Diagnosis present

## 2018-05-21 DIAGNOSIS — R5381 Other malaise: Secondary | ICD-10-CM

## 2018-05-21 DIAGNOSIS — Z953 Presence of xenogenic heart valve: Secondary | ICD-10-CM

## 2018-05-21 DIAGNOSIS — J449 Chronic obstructive pulmonary disease, unspecified: Secondary | ICD-10-CM | POA: Diagnosis present

## 2018-05-21 DIAGNOSIS — E876 Hypokalemia: Secondary | ICD-10-CM | POA: Diagnosis present

## 2018-05-21 DIAGNOSIS — K219 Gastro-esophageal reflux disease without esophagitis: Secondary | ICD-10-CM | POA: Diagnosis present

## 2018-05-21 DIAGNOSIS — I5022 Chronic systolic (congestive) heart failure: Secondary | ICD-10-CM

## 2018-05-21 DIAGNOSIS — E785 Hyperlipidemia, unspecified: Secondary | ICD-10-CM | POA: Diagnosis present

## 2018-05-21 DIAGNOSIS — I872 Venous insufficiency (chronic) (peripheral): Secondary | ICD-10-CM | POA: Diagnosis present

## 2018-05-21 DIAGNOSIS — J811 Chronic pulmonary edema: Secondary | ICD-10-CM | POA: Diagnosis not present

## 2018-05-21 DIAGNOSIS — E872 Acidosis: Secondary | ICD-10-CM | POA: Diagnosis present

## 2018-05-21 DIAGNOSIS — Z811 Family history of alcohol abuse and dependence: Secondary | ICD-10-CM

## 2018-05-21 DIAGNOSIS — L03116 Cellulitis of left lower limb: Secondary | ICD-10-CM

## 2018-05-21 DIAGNOSIS — Z794 Long term (current) use of insulin: Secondary | ICD-10-CM | POA: Diagnosis not present

## 2018-05-21 DIAGNOSIS — Z8041 Family history of malignant neoplasm of ovary: Secondary | ICD-10-CM

## 2018-05-21 DIAGNOSIS — Z7901 Long term (current) use of anticoagulants: Secondary | ICD-10-CM

## 2018-05-21 DIAGNOSIS — I5023 Acute on chronic systolic (congestive) heart failure: Secondary | ICD-10-CM | POA: Diagnosis not present

## 2018-05-21 DIAGNOSIS — I447 Left bundle-branch block, unspecified: Secondary | ICD-10-CM | POA: Diagnosis present

## 2018-05-21 DIAGNOSIS — Z96641 Presence of right artificial hip joint: Secondary | ICD-10-CM | POA: Diagnosis present

## 2018-05-21 DIAGNOSIS — Z801 Family history of malignant neoplasm of trachea, bronchus and lung: Secondary | ICD-10-CM

## 2018-05-21 DIAGNOSIS — J9602 Acute respiratory failure with hypercapnia: Secondary | ICD-10-CM | POA: Diagnosis not present

## 2018-05-21 DIAGNOSIS — Z8261 Family history of arthritis: Secondary | ICD-10-CM

## 2018-05-21 DIAGNOSIS — R4182 Altered mental status, unspecified: Secondary | ICD-10-CM | POA: Diagnosis present

## 2018-05-21 DIAGNOSIS — E1143 Type 2 diabetes mellitus with diabetic autonomic (poly)neuropathy: Secondary | ICD-10-CM | POA: Diagnosis present

## 2018-05-21 DIAGNOSIS — K3184 Gastroparesis: Secondary | ICD-10-CM | POA: Diagnosis present

## 2018-05-21 DIAGNOSIS — Z6835 Body mass index (BMI) 35.0-35.9, adult: Secondary | ICD-10-CM

## 2018-05-21 DIAGNOSIS — Z833 Family history of diabetes mellitus: Secondary | ICD-10-CM

## 2018-05-21 DIAGNOSIS — Z87891 Personal history of nicotine dependence: Secondary | ICD-10-CM

## 2018-05-21 DIAGNOSIS — Z91048 Other nonmedicinal substance allergy status: Secondary | ICD-10-CM

## 2018-05-21 DIAGNOSIS — S91302D Unspecified open wound, left foot, subsequent encounter: Secondary | ICD-10-CM | POA: Diagnosis not present

## 2018-05-21 DIAGNOSIS — G4733 Obstructive sleep apnea (adult) (pediatric): Secondary | ICD-10-CM | POA: Diagnosis present

## 2018-05-21 DIAGNOSIS — Z8249 Family history of ischemic heart disease and other diseases of the circulatory system: Secondary | ICD-10-CM

## 2018-05-21 LAB — BLOOD GAS, VENOUS
Acid-Base Excess: 16 mmol/L — ABNORMAL HIGH (ref 0.0–2.0)
Bicarbonate: 35.3 mmol/L — ABNORMAL HIGH (ref 20.0–28.0)
Drawn by: 52002
FIO2: 21
O2 Saturation: 32.5 %
Patient temperature: 37
pCO2, Ven: 84.2 mmHg (ref 44.0–60.0)
pH, Ven: 7.325 (ref 7.250–7.430)

## 2018-05-21 LAB — COMPREHENSIVE METABOLIC PANEL
ALT: 19 U/L (ref 0–44)
AST: 27 U/L (ref 15–41)
Albumin: 3.1 g/dL — ABNORMAL LOW (ref 3.5–5.0)
Alkaline Phosphatase: 85 U/L (ref 38–126)
Anion gap: 7 (ref 5–15)
BUN: 13 mg/dL (ref 8–23)
CO2: 40 mmol/L — ABNORMAL HIGH (ref 22–32)
Calcium: 8.5 mg/dL — ABNORMAL LOW (ref 8.9–10.3)
Chloride: 92 mmol/L — ABNORMAL LOW (ref 98–111)
Creatinine, Ser: 1.05 mg/dL (ref 0.61–1.24)
GFR calc Af Amer: >60 mL/min (ref >60)
GFR calc non Af Amer: >60 mL/min (ref >60)
Glucose, Bld: 189 mg/dL — ABNORMAL HIGH (ref 70–99)
Potassium: 3.3 mmol/L — ABNORMAL LOW (ref 3.5–5.1)
Sodium: 139 mmol/L (ref 135–145)
Total Bilirubin: 1 mg/dL (ref 0.3–1.2)
Total Protein: 6.3 g/dL — ABNORMAL LOW (ref 6.5–8.1)

## 2018-05-21 LAB — CBC
HCT: 44.7 % (ref 39.0–52.0)
Hemoglobin: 13.3 g/dL (ref 13.0–17.0)
MCH: 28.8 pg (ref 26.0–34.0)
MCHC: 29.8 g/dL — ABNORMAL LOW (ref 30.0–36.0)
MCV: 96.8 fL (ref 80.0–100.0)
Platelets: 163 10*3/uL (ref 150–400)
RBC: 4.62 MIL/uL (ref 4.22–5.81)
RDW: 15.9 % — ABNORMAL HIGH (ref 11.5–15.5)
WBC: 5 10*3/uL (ref 4.0–10.5)
nRBC: 0 % (ref 0.0–0.2)

## 2018-05-21 LAB — URINALYSIS, ROUTINE W REFLEX MICROSCOPIC
Bilirubin Urine: NEGATIVE
Glucose, UA: NEGATIVE mg/dL
Hgb urine dipstick: NEGATIVE
Ketones, ur: NEGATIVE mg/dL
Leukocytes, UA: NEGATIVE
Nitrite: NEGATIVE
Protein, ur: NEGATIVE mg/dL
Specific Gravity, Urine: 1.01 (ref 1.005–1.030)
pH: 7 (ref 5.0–8.0)

## 2018-05-21 LAB — URINE CULTURE
Culture: NO GROWTH
Special Requests: NORMAL

## 2018-05-21 LAB — I-STAT CG4 LACTIC ACID, ED: Lactic Acid, Venous: 1.95 mmol/L — ABNORMAL HIGH (ref 0.5–1.9)

## 2018-05-21 LAB — CBG MONITORING, ED: Glucose-Capillary: 175 mg/dL — ABNORMAL HIGH (ref 70–99)

## 2018-05-21 LAB — BRAIN NATRIURETIC PEPTIDE: B Natriuretic Peptide: 319 pg/mL — ABNORMAL HIGH (ref 0.0–100.0)

## 2018-05-21 LAB — I-STAT TROPONIN, ED: Troponin i, poc: 0 ng/mL (ref 0.00–0.08)

## 2018-05-21 NOTE — ED Notes (Signed)
Date and time results received: 05/21/18 11:31 PM    Test: pCO2, Ven Critical Value: 84.2  Name of Provider Notified: Dr Pilar Plate  Orders Received? Or Actions Taken?: MD notified
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
## 2018-05-21 NOTE — ED Triage Notes (Signed)
Pt is resident of [facility], in by [facility] for ?? Altered mental status.  Per ems, staff report when they went to check on the patient, he was not talking to them.  Pt tells this nurse that he "just didn't feel like talking"  Pt denies complaints at this time.  Per ems,  cbg was 321

## 2018-05-22 DIAGNOSIS — J9602 Acute respiratory failure with hypercapnia: Secondary | ICD-10-CM | POA: Diagnosis not present

## 2018-05-22 DIAGNOSIS — Z952 Presence of prosthetic heart valve: Secondary | ICD-10-CM | POA: Diagnosis not present

## 2018-05-22 DIAGNOSIS — F329 Major depressive disorder, single episode, unspecified: Secondary | ICD-10-CM | POA: Diagnosis not present

## 2018-05-22 DIAGNOSIS — Z833 Family history of diabetes mellitus: Secondary | ICD-10-CM | POA: Diagnosis not present

## 2018-05-22 DIAGNOSIS — R4182 Altered mental status, unspecified: Secondary | ICD-10-CM | POA: Diagnosis not present

## 2018-05-22 DIAGNOSIS — I5022 Chronic systolic (congestive) heart failure: Secondary | ICD-10-CM | POA: Diagnosis not present

## 2018-05-22 DIAGNOSIS — Z811 Family history of alcohol abuse and dependence: Secondary | ICD-10-CM | POA: Diagnosis not present

## 2018-05-22 DIAGNOSIS — I10 Essential (primary) hypertension: Secondary | ICD-10-CM | POA: Diagnosis not present

## 2018-05-22 DIAGNOSIS — M6281 Muscle weakness (generalized): Secondary | ICD-10-CM | POA: Diagnosis not present

## 2018-05-22 DIAGNOSIS — K3184 Gastroparesis: Secondary | ICD-10-CM | POA: Diagnosis present

## 2018-05-22 DIAGNOSIS — I2510 Atherosclerotic heart disease of native coronary artery without angina pectoris: Secondary | ICD-10-CM | POA: Diagnosis present

## 2018-05-22 DIAGNOSIS — J9692 Respiratory failure, unspecified with hypercapnia: Secondary | ICD-10-CM | POA: Diagnosis not present

## 2018-05-22 DIAGNOSIS — K219 Gastro-esophageal reflux disease without esophagitis: Secondary | ICD-10-CM | POA: Diagnosis not present

## 2018-05-22 DIAGNOSIS — I110 Hypertensive heart disease with heart failure: Secondary | ICD-10-CM | POA: Diagnosis present

## 2018-05-22 DIAGNOSIS — Z8249 Family history of ischemic heart disease and other diseases of the circulatory system: Secondary | ICD-10-CM | POA: Diagnosis not present

## 2018-05-22 DIAGNOSIS — Z7901 Long term (current) use of anticoagulants: Secondary | ICD-10-CM | POA: Diagnosis not present

## 2018-05-22 DIAGNOSIS — Z801 Family history of malignant neoplasm of trachea, bronchus and lung: Secondary | ICD-10-CM | POA: Diagnosis not present

## 2018-05-22 DIAGNOSIS — J9611 Chronic respiratory failure with hypoxia: Secondary | ICD-10-CM | POA: Diagnosis not present

## 2018-05-22 DIAGNOSIS — R262 Difficulty in walking, not elsewhere classified: Secondary | ICD-10-CM | POA: Diagnosis not present

## 2018-05-22 DIAGNOSIS — I447 Left bundle-branch block, unspecified: Secondary | ICD-10-CM | POA: Diagnosis present

## 2018-05-22 DIAGNOSIS — M159 Polyosteoarthritis, unspecified: Secondary | ICD-10-CM | POA: Diagnosis not present

## 2018-05-22 DIAGNOSIS — E785 Hyperlipidemia, unspecified: Secondary | ICD-10-CM | POA: Diagnosis not present

## 2018-05-22 DIAGNOSIS — F40240 Claustrophobia: Secondary | ICD-10-CM | POA: Diagnosis not present

## 2018-05-22 DIAGNOSIS — E872 Acidosis: Secondary | ICD-10-CM | POA: Diagnosis present

## 2018-05-22 DIAGNOSIS — Z87891 Personal history of nicotine dependence: Secondary | ICD-10-CM | POA: Diagnosis not present

## 2018-05-22 DIAGNOSIS — Z8041 Family history of malignant neoplasm of ovary: Secondary | ICD-10-CM | POA: Diagnosis not present

## 2018-05-22 DIAGNOSIS — F332 Major depressive disorder, recurrent severe without psychotic features: Secondary | ICD-10-CM | POA: Diagnosis present

## 2018-05-22 DIAGNOSIS — R279 Unspecified lack of coordination: Secondary | ICD-10-CM | POA: Diagnosis not present

## 2018-05-22 DIAGNOSIS — I4821 Permanent atrial fibrillation: Secondary | ICD-10-CM | POA: Diagnosis not present

## 2018-05-22 DIAGNOSIS — L03116 Cellulitis of left lower limb: Secondary | ICD-10-CM | POA: Diagnosis not present

## 2018-05-22 DIAGNOSIS — D519 Vitamin B12 deficiency anemia, unspecified: Secondary | ICD-10-CM | POA: Diagnosis not present

## 2018-05-22 DIAGNOSIS — Z953 Presence of xenogenic heart valve: Secondary | ICD-10-CM | POA: Diagnosis not present

## 2018-05-22 DIAGNOSIS — M170 Bilateral primary osteoarthritis of knee: Secondary | ICD-10-CM | POA: Diagnosis not present

## 2018-05-22 DIAGNOSIS — G4733 Obstructive sleep apnea (adult) (pediatric): Secondary | ICD-10-CM | POA: Diagnosis not present

## 2018-05-22 DIAGNOSIS — Z8261 Family history of arthritis: Secondary | ICD-10-CM | POA: Diagnosis not present

## 2018-05-22 DIAGNOSIS — Z6835 Body mass index (BMI) 35.0-35.9, adult: Secondary | ICD-10-CM | POA: Diagnosis not present

## 2018-05-22 DIAGNOSIS — Z881 Allergy status to other antibiotic agents status: Secondary | ICD-10-CM | POA: Diagnosis not present

## 2018-05-22 DIAGNOSIS — J449 Chronic obstructive pulmonary disease, unspecified: Secondary | ICD-10-CM | POA: Diagnosis present

## 2018-05-22 DIAGNOSIS — R2689 Other abnormalities of gait and mobility: Secondary | ICD-10-CM | POA: Diagnosis not present

## 2018-05-22 DIAGNOSIS — I471 Supraventricular tachycardia: Secondary | ICD-10-CM | POA: Diagnosis not present

## 2018-05-22 DIAGNOSIS — E1143 Type 2 diabetes mellitus with diabetic autonomic (poly)neuropathy: Secondary | ICD-10-CM | POA: Diagnosis present

## 2018-05-22 DIAGNOSIS — R400 Somnolence: Secondary | ICD-10-CM | POA: Diagnosis not present

## 2018-05-22 DIAGNOSIS — I482 Chronic atrial fibrillation, unspecified: Secondary | ICD-10-CM | POA: Diagnosis present

## 2018-05-22 DIAGNOSIS — Z96641 Presence of right artificial hip joint: Secondary | ICD-10-CM | POA: Diagnosis present

## 2018-05-22 LAB — CBC
HCT: 41.5 % (ref 39.0–52.0)
Hemoglobin: 12.3 g/dL — ABNORMAL LOW (ref 13.0–17.0)
MCH: 28.4 pg (ref 26.0–34.0)
MCHC: 29.6 g/dL — ABNORMAL LOW (ref 30.0–36.0)
MCV: 95.8 fL (ref 80.0–100.0)
Platelets: 149 10*3/uL — ABNORMAL LOW (ref 150–400)
RBC: 4.33 MIL/uL (ref 4.22–5.81)
RDW: 15.9 % — ABNORMAL HIGH (ref 11.5–15.5)
WBC: 4.8 10*3/uL (ref 4.0–10.5)
nRBC: 0 % (ref 0.0–0.2)

## 2018-05-22 LAB — COMPREHENSIVE METABOLIC PANEL
ALT: 18 U/L (ref 0–44)
AST: 26 U/L (ref 15–41)
Albumin: 2.8 g/dL — ABNORMAL LOW (ref 3.5–5.0)
Alkaline Phosphatase: 72 U/L (ref 38–126)
Anion gap: 9 (ref 5–15)
BUN: 12 mg/dL (ref 8–23)
CO2: 40 mmol/L — ABNORMAL HIGH (ref 22–32)
Calcium: 8.5 mg/dL — ABNORMAL LOW (ref 8.9–10.3)
Chloride: 94 mmol/L — ABNORMAL LOW (ref 98–111)
Creatinine, Ser: 0.87 mg/dL (ref 0.61–1.24)
GFR calc Af Amer: >60 mL/min (ref >60)
GFR calc non Af Amer: >60 mL/min (ref >60)
Glucose, Bld: 157 mg/dL — ABNORMAL HIGH (ref 70–99)
Potassium: 3.1 mmol/L — ABNORMAL LOW (ref 3.5–5.1)
Sodium: 143 mmol/L (ref 135–145)
Total Bilirubin: 1 mg/dL (ref 0.3–1.2)
Total Protein: 5.8 g/dL — ABNORMAL LOW (ref 6.5–8.1)

## 2018-05-22 LAB — BLOOD GAS, ARTERIAL
Acid-Base Excess: 13.9 mmol/L — ABNORMAL HIGH (ref 0.0–2.0)
Bicarbonate: 35.2 mmol/L — ABNORMAL HIGH (ref 20.0–28.0)
Drawn by: 234301
O2 Content: 4 L/min
O2 Saturation: 96.4 %
Patient temperature: 37
pCO2 arterial: 78 mmHg (ref 32.0–48.0)
pH, Arterial: 7.333 — ABNORMAL LOW (ref 7.350–7.450)
pO2, Arterial: 89.7 mmHg (ref 83.0–108.0)

## 2018-05-22 LAB — MRSA PCR SCREENING: MRSA by PCR: NEGATIVE

## 2018-05-22 MED ORDER — SODIUM CHLORIDE 0.9% FLUSH: 3.0000 mL | INTRAVENOUS | Status: DC | PRN

## 2018-05-22 MED ORDER — MIDODRINE HCL 5 MG PO TABS
5.0000 mg | ORAL_TABLET | Freq: Three times a day (TID) | ORAL | Status: DC
  Administered 2018-05-22 – 2018-05-26 (×15): 5 mg via ORAL
  Filled 2018-05-22 (×15): qty 1

## 2018-05-22 MED ORDER — METOCLOPRAMIDE HCL 10 MG PO TABS
5.0000 mg | ORAL_TABLET | Freq: Four times a day (QID) | ORAL | Status: DC
  Administered 2018-05-22 – 2018-05-26 (×19): 5 mg via ORAL
  Filled 2018-05-22 (×19): qty 1

## 2018-05-22 MED ORDER — POTASSIUM CHLORIDE 10 MEQ/100ML IV SOLN
10.0000 meq | Freq: Once | INTRAVENOUS | Status: AC
  Administered 2018-05-22: 10 meq via INTRAVENOUS
  Filled 2018-05-22: qty 100

## 2018-05-22 MED ORDER — CARVEDILOL 3.125 MG PO TABS
3.1250 mg | ORAL_TABLET | Freq: Two times a day (BID) | ORAL | Status: DC
  Administered 2018-05-22 – 2018-05-26 (×9): 3.125 mg via ORAL
  Filled 2018-05-22 (×10): qty 1

## 2018-05-22 MED ORDER — SODIUM CHLORIDE 0.9% FLUSH
3.0000 mL | Freq: Two times a day (BID) | INTRAVENOUS | Status: DC
  Administered 2018-05-22 – 2018-05-23 (×5): 3 mL via INTRAVENOUS
  Administered 2018-05-24: 10 mL via INTRAVENOUS
  Administered 2018-05-24 – 2018-05-26 (×4): 3 mL via INTRAVENOUS

## 2018-05-22 MED ORDER — ONDANSETRON HCL 4 MG PO TABS: 4.0000 mg | ORAL_TABLET | Freq: Four times a day (QID) | ORAL | Status: DC | PRN

## 2018-05-22 MED ORDER — CHLORHEXIDINE GLUCONATE 0.12 % MT SOLN
15.0000 mL | Freq: Two times a day (BID) | OROMUCOSAL | Status: DC
  Administered 2018-05-22 – 2018-05-26 (×8): 15 mL via OROMUCOSAL
  Filled 2018-05-22 (×8): qty 15

## 2018-05-22 MED ORDER — TORSEMIDE 20 MG PO TABS
20.0000 mg | ORAL_TABLET | Freq: Two times a day (BID) | ORAL | Status: DC
  Administered 2018-05-22 – 2018-05-26 (×10): 20 mg via ORAL
  Filled 2018-05-22 (×10): qty 1

## 2018-05-22 MED ORDER — PANTOPRAZOLE SODIUM 40 MG PO TBEC
40.0000 mg | DELAYED_RELEASE_TABLET | Freq: Every day | ORAL | Status: DC
  Administered 2018-05-22 – 2018-05-26 (×5): 40 mg via ORAL
  Filled 2018-05-22 (×5): qty 1

## 2018-05-22 MED ORDER — ORAL CARE MOUTH RINSE
15.0000 mL | Freq: Two times a day (BID) | OROMUCOSAL | Status: DC
  Administered 2018-05-23 – 2018-05-26 (×6): 15 mL via OROMUCOSAL

## 2018-05-22 MED ORDER — SODIUM CHLORIDE 0.9 % IV SOLN: 250.0000 mL | INTRAVENOUS | Status: DC | PRN

## 2018-05-22 MED ORDER — SODIUM CHLORIDE 0.9 % IV SOLN
2.0000 g | INTRAVENOUS | Status: DC
  Administered 2018-05-22 – 2018-05-23 (×2): 2 g via INTRAVENOUS
  Filled 2018-05-22: qty 2
  Filled 2018-05-22 (×3): qty 20

## 2018-05-22 MED ORDER — FLUTICASONE PROPIONATE 50 MCG/ACT NA SUSP
2.0000 | Freq: Every day | NASAL | Status: DC
  Administered 2018-05-23 – 2018-05-26 (×4): 2 via NASAL
  Filled 2018-05-22: qty 16

## 2018-05-22 MED ORDER — RIVAROXABAN 20 MG PO TABS
20.0000 mg | ORAL_TABLET | Freq: Every day | ORAL | Status: DC
  Administered 2018-05-22 – 2018-05-26 (×5): 20 mg via ORAL
  Filled 2018-05-22 (×5): qty 1

## 2018-05-22 MED ORDER — ONDANSETRON HCL 4 MG/2ML IJ SOLN: 4.0000 mg | Freq: Four times a day (QID) | INTRAMUSCULAR | Status: DC | PRN

## 2018-05-22 MED ORDER — SIMVASTATIN 20 MG PO TABS
20.0000 mg | ORAL_TABLET | Freq: Every morning | ORAL | Status: DC
  Administered 2018-05-22 – 2018-05-26 (×5): 20 mg via ORAL
  Filled 2018-05-22 (×5): qty 1

## 2018-05-22 MED ORDER — POLYETHYLENE GLYCOL 3350 17 G PO PACK
17.0000 g | PACK | Freq: Every day | ORAL | Status: DC
  Administered 2018-05-23 – 2018-05-26 (×2): 17 g via ORAL
  Filled 2018-05-22 (×4): qty 1

## 2018-05-22 NOTE — Clinical Social Work Note (Signed)
Pt is a 67 year old male admitted from [facility]F. Pt is known to CSW from previous admissions. Spoke with Babette Relic and Lupita Leash at [facility] today to update. It appears pt will need SNF rehab at dc. Will ask MD to order PT for pt when he is medically stable. Will follow up Monday to further assess and assist with dc planning.

## 2018-05-22 NOTE — Addendum Note (Signed)
Addended by: Leafy Half on: 05/22/2018 11:53 AM   Modules accepted: Level of Service, SmartSet
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
## 2018-05-22 NOTE — Care Management Note (Signed)
Case Management Note  Patient Details  Name: Cody Hale MRN: [id number] Date of Birth: Jun 03, 1951  Subjective/Objective:      AMS, Co2 retention. Patient is from [facility]. This is his third admission this week. He was recently set up with oxygen with [facility]. He apparently does not have a CPAP at [facility]. Patient reported to attendings that he had one but did not use it.  Patient unable to conversate at this time. Spoke wit daughter via phone.  She reports that to her knowledge patient has never had a CPAP or Bipap.  Upon chart review patient has had a sleep study in 2014.  Discussed with [place], they verified that Medicare has never been billed for CPAP or Bipap, but his report says he would require a Bipap.  Discussed with attending and CSW.  Action/Plan: IF [facility] will take patient back with a Bipap- [facility] can provide a Bipap if patient/family will consent to signing an ABN( saying the will cover cost until Medicare can be billed and start paying).     Expected Discharge Date:    unk              Expected Discharge Plan:     In-House Referral:  Clinical Social Work  Discharge planning Services  CM Consult  Post Acute Care Choice:  Durable Medical Equipment Choice offered to:     DME Arranged:    DME Agency:     HH Arranged:    HH Agency:     Status of Service:  In process, will continue to follow  If discussed at Long Length of Stay Meetings, dates discussed:    Additional Comments:  Cody Hale, Cody Oiler, RN 05/22/2018, 12:34 PM

## 2018-05-22 NOTE — Progress Notes (Signed)
Patient seen and examined.  Admitted from his assisted living facility secondary to increased somnolence and altered mental status changes.  Found to have hypercapnia CO2 in the 80s range.  No signs of acute infection appreciated, hemodynamically stable, after couple hours on BiPAP is more oriented and answering questions appropriately.  Continue current management.  Discussed with case manager and social worker to try to arrange for outpatient equipment to control his oxygen supplementation and treatment for OSA at discharge. Please refer to H&P written by Dr. Sharl Ma for further info/details on admission.  Cody Loll MD [phone]

## 2018-05-22 NOTE — Progress Notes (Signed)
This encounter was created in error - please disregard.

## 2018-05-23 DIAGNOSIS — I10 Essential (primary) hypertension: Secondary | ICD-10-CM

## 2018-05-23 DIAGNOSIS — K219 Gastro-esophageal reflux disease without esophagitis: Secondary | ICD-10-CM

## 2018-05-23 DIAGNOSIS — E785 Hyperlipidemia, unspecified: Secondary | ICD-10-CM

## 2018-05-23 DIAGNOSIS — R400 Somnolence: Secondary | ICD-10-CM

## 2018-05-23 DIAGNOSIS — I5022 Chronic systolic (congestive) heart failure: Secondary | ICD-10-CM

## 2018-05-23 LAB — BLOOD GAS, ARTERIAL
Acid-Base Excess: 12.4 mmol/L — ABNORMAL HIGH (ref 0.0–2.0)
Bicarbonate: 36 mmol/L — ABNORMAL HIGH (ref 20.0–28.0)
Delivery systems: POSITIVE
Drawn by: 277331
Expiratory PAP: 8
FIO2: 0.5
Inspiratory PAP: 18
Mode: POSITIVE
O2 Saturation: 99.6 %
Patient temperature: 37
RATE: 12 resp/min
pCO2 arterial: 41.7 mmHg (ref 32.0–48.0)
pH, Arterial: 7.545 — ABNORMAL HIGH (ref 7.350–7.450)
pO2, Arterial: 186 mmHg — ABNORMAL HIGH (ref 83.0–108.0)

## 2018-05-23 MED ORDER — DOXYCYCLINE HYCLATE 100 MG PO TABS
100.0000 mg | ORAL_TABLET | Freq: Two times a day (BID) | ORAL | Status: DC
  Administered 2018-05-23 – 2018-05-26 (×7): 100 mg via ORAL
  Filled 2018-05-23 (×7): qty 1

## 2018-05-23 NOTE — Progress Notes (Signed)
PT refuses to wear BIPAP. BIPAP removed. PT currently on Room Air with O2 sat 92% or greater. Will continue to monitor. 2L O2 PRN if needed.

## 2018-05-23 NOTE — Progress Notes (Signed)
PROGRESS NOTE    Cody Hale  ZOX:[id number] DOB: [birth year] DOA: 05/21/2018 PCP: [name], MD     Brief Narrative:  67 y.o. male, with a history of atrial fibrillation on chronic anticoagulation with Xarelto, CAD, history of endocarditis, left bundle branch block, history of mitral valve replacement, chronic systolic heart failure, SVT, COPD, chronic venous insufficiency, hypertension who was just discharged from the hospital on 05/20/2018 after he was treated for altered mental status/somnolence.  Patient went to ALF and was again found to be somnolent.  He was sent to ED for further evaluation.  Patient found with acute hypercapnic respiratory failure.  Assessment & Plan: 1-altered mental status/somnolence in the setting of acute hypercapnic respiratory failure -CO2 on admission around 84 -Excellent response with the use of BiPAP -At this time repeat ABG demonstrated CO2 of 41.7 -Patient mentation back to baseline -Will discontinue BiPAP and use it just at nighttime -Advance diet -Continue supportive care.  2-chronic systolic heart failure -Appears to be compensated -Continue Demadex -Follow daily weights and [place] intake and output  3-left lower extremity edema/erythema -Significant improvement in his swelling -There is a component of chronic venous stasis dermatitis as well as mild cellulitis -Doxycycline will be given to complete antibiotic therapy. -Patient denies any pain.  4-chronic atrial fibrillation -CHADsVASC score 5 -Continue beta-blockers -Continue Xarelto -rate is controlled  5-hypertension -Stable and well-controlled currently -Will follow vital signs.  6-gastroesophageal reflux disease -Continue the use of Protonix  7-hyperlipidemia -Continue the use of statins   DVT prophylaxis: Xarelto Code Status: Full code Family Communication: No family at bedside Disposition Plan: We will watch overnight in the stepdown during transitioning  BiPAP off and only using BiPAP at bedtime.  Transition antibiotics to oral regimen.  Consultants:   None  Procedures:   See below for x-ray reports.  Antimicrobials:  Anti-infectives (From admission, onward)   Start     Dose/Rate Route Frequency Ordered Stop   05/23/18 1000  doxycycline (VIBRA-TABS) tablet 100 mg     100 mg Oral Every 12 hours 05/23/18 0925 05/28/18 0959   05/22/18 0215  cefTRIAXone (ROCEPHIN) 2 g in sodium chloride 0.9 % 100 mL IVPB  Status:  Discontinued     2 g 200 mL/hr over 30 Minutes Intravenous Every 24 hours 05/22/18 0208 05/23/18 0925      Subjective: Afebrile, no chest pain, no nausea, no vomiting.  Expressed to be very hungry and is no complaining of shortness of breath at this time.  Patient is alert, awake and oriented x3 following commands appropriately and in no acute distress.  Objective: Vitals:   05/23/18 0300 05/23/18 0327 05/23/18 0400 05/23/18 0500  BP: 131/71 131/71 140/76   Pulse: 87 86 83 85  Resp: 18 14 13  (!) 22  Temp:   98.3 F (36.8 C)   TempSrc:   Oral   SpO2: 100% 98% 100% 90%  Weight:      Height:        Intake/Output Summary (Last 24 hours) at 05/23/2018 0926 Last data filed at 05/22/2018 2345 Gross per 24 hour  Intake 3 ml  Output 1000 ml  Net -997 ml   Filed Weights   05/21/18 2146  Weight: 112.5 kg    Examination: General exam: Alert, awake, oriented x 3; afebrile, denies chest pain, no nausea, no vomiting.  He is very hungry and reports breathing is a stable. Respiratory system: Good air movement bilaterally, clear to auscultation. Respiratory effort normal. Cardiovascular system:Regular rate. No murmurs,  rubs, gallops. Gastrointestinal system: Abdomen is nondistended, soft and nontender. No organomegaly or masses felt. Normal bowel sounds heard. Central nervous system: Alert and oriented. No focal neurological deficits. Extremities: No cyanosis or clubbing; patient with very mild erythema on his left lower  extremity and some chronic changes of venous stasis dermatitis of both legs. Skin: No open wounds, no petechiae, venous stasis dermatitis and mild erythema as mentioned on extremity section. Psychiatry: Judgement and insight appear normal. Mood & affect appropriate.    Data Reviewed: I have personally reviewed following labs and imaging studies  CBC: Recent Labs  Lab 05/16/18 0956 05/19/18 1308 05/20/18 0420 05/21/18 2159 05/22/18 0527  WBC 5.9 5.4 5.6 5.0 4.8  NEUTROABS 4.5  --   --   --   --   HGB 13.1 11.8* 12.1* 13.3 12.3*  HCT 42.5 39.1 40.2 44.7 41.5  MCV 93.4 96.3 97.8 96.8 95.8  PLT 216 156 160 163 149*   Basic Metabolic Panel: Recent Labs  Lab 05/16/18 0956  05/18/18 0518 05/19/18 1308 05/19/18 2033 05/20/18 0420 05/21/18 2159 05/22/18 0527  NA 143   < > 145 138 137 141 139 143  K 2.8*   < > 3.8 3.6 3.6 3.6 3.3* 3.1*  CL 93*   < > 94* 92* 92* 92* 92* 94*  CO2 41*   < > 40* 39* 39* 40* 40* 40*  GLUCOSE 176*   < > 168* 180* 179* 142* 189* 157*  BUN 7*   < > 13 19 19 16 13 12   CREATININE 0.78   < > 1.26* 1.18 1.19 0.93 1.05 0.87  CALCIUM 8.3*   < > 8.8* 8.1* 8.0* 8.1* 8.5* 8.5*  MG 1.7  --  2.1  --  1.7  --   --   --   PHOS  --   --   --   --  3.7  --   --   --    < > = values in this interval not displayed.   GFR: Estimated Creatinine Clearance: 101.9 mL/min (by C-G formula based on SCr of 0.87 mg/dL). Liver Function Tests: Recent Labs  Lab 05/19/18 1308 05/20/18 0420 05/21/18 2159 05/22/18 0527  AST 32 31 27 26   ALT 19 17 19 18   ALKPHOS 73 72 85 72  BILITOT 1.0 1.1 1.0 1.0  PROT 5.4* 5.5* 6.3* 5.8*  ALBUMIN 2.7* 2.7* 3.1* 2.8*   Recent Labs  Lab 05/19/18 1537  AMMONIA 18   Cardiac Enzymes: Recent Labs  Lab 05/16/18 0956 05/19/18 1308 05/19/18 2033  TROPONINI 0.04* 0.03* 0.03*   CBG: Recent Labs  Lab 05/18/18 1140 05/19/18 1254 05/19/18 2218 05/20/18 0803 05/21/18 2150  GLUCAP 168* 172* 166* 135* 175*   Urine analysis:      Component Value Date/Time   COLORURINE YELLOW 05/21/2018 2251   APPEARANCEUR CLEAR 05/21/2018 2251   APPEARANCEUR Clear 07/23/2017   LABSPEC 1.010 05/21/2018 2251   PHURINE 7.0 05/21/2018 2251   GLUCOSEU NEGATIVE 05/21/2018 2251   GLUCOSEU 500 (A) 07/14/2017 1053   HGBUR NEGATIVE 05/21/2018 2251   BILIRUBINUR NEGATIVE 05/21/2018 2251   BILIRUBINUR Negative 07/23/2017   KETONESUR NEGATIVE 05/21/2018 2251   PROTEINUR NEGATIVE 05/21/2018 2251   UROBILINOGEN 0.2 07/14/2017 1053   NITRITE NEGATIVE 05/21/2018 2251   LEUKOCYTESUR NEGATIVE 05/21/2018 2251   LEUKOCYTESUR Negative (A) 07/23/2017    Recent Results (from the past 240 hour(s))  Urine culture     Status: None   Collection Time: 05/19/18  3:37 PM  Result Value Ref Range Status   Specimen Description   Final    URINE, CATHETERIZED Performed at [facility], [address] [place], [place] [address]    Special Requests   Final    Normal Performed at [facility], [address] [place], [place] [address]    Culture   Final    NO GROWTH Performed at [facility], [address], [place], [place] [address]    Report Status 05/21/2018 FINAL  Final  MRSA PCR Screening     Status: None   Collection Time: 05/19/18 11:54 PM  Result Value Ref Range Status   MRSA by PCR NEGATIVE NEGATIVE Final    Comment:        The GeneXpert MRSA Assay (FDA approved for NASAL specimens only), is one component of a comprehensive MRSA colonization surveillance program. It is not intended to diagnose MRSA infection nor to guide or monitor treatment for MRSA infections. Performed at [facility], [address] [place], [place] [address]   MRSA PCR Screening     Status: None   Collection Time: 05/22/18  5:22 AM  Result Value Ref Range Status   MRSA by PCR NEGATIVE NEGATIVE Final    Comment:        The GeneXpert MRSA Assay (FDA approved for NASAL specimens only), is one component of a comprehensive MRSA  colonization surveillance program. It is not intended to diagnose MRSA infection nor to guide or monitor treatment for MRSA infections. Performed at [facility], [address], [place], [place] [address]      Radiology Studies: [facility] 1 View  Result Date: 05/21/2018 CLINICAL DATA:  Hyperglycemia.  Acute mental status change. EXAM: PORTABLE CHEST 1 VIEW COMPARISON:  May 19, 2018 FINDINGS: Stable cardiomegaly. Hila and mediastinum are unchanged. No pneumothorax. Bilateral pleural effusions with underlying atelectasis. Mild edema. No other acute abnormalities. IMPRESSION: Cardiomegaly, small effusions, and pulmonary edema. Electronically Signed   By: Gerome Sam III M.D   On: 05/21/2018 23:21   Scheduled Meds: . carvedilol  3.125 mg Oral BID  . chlorhexidine  15 mL Mouth Rinse BID  . doxycycline  100 mg Oral Q12H  . fluticasone  2 spray Each Nare Daily  . mouth rinse  15 mL Mouth Rinse q12n4p  . metoCLOPramide  5 mg Oral QID  . midodrine  5 mg Oral TID  . pantoprazole  40 mg Oral Daily  . polyethylene glycol  17 g Oral Daily  . rivaroxaban  20 mg Oral Daily  . simvastatin  20 mg Oral q morning - 10a  . sodium chloride flush  3 mL Intravenous Q12H  . torsemide  20 mg Oral BID   Continuous Infusions: . sodium chloride       LOS: 1 day    Time spent: 30 minutes. Greater than 50% of this time was spent in direct contact with the patient, coordinating care and discussing relevant ongoing clinical issues, including discussion about the need of BiPAP, results of his ABG and anticipated transition of noninvasive ventilatory support, advancement of his diet and need for nightly BiPAP usage.  Patient is afebrile and having no chest pain.  Alert, awake and oriented x3.     Vassie Loll, MD [facility] Pager [phone]  If 7PM-7AM, please contact night-coverage [email or web address] Password [email or web address] 05/23/2018, 9:26 AM

## 2018-05-24 MED ORDER — ACETAMINOPHEN 325 MG PO TABS
650.0000 mg | ORAL_TABLET | Freq: Four times a day (QID) | ORAL | Status: DC | PRN
  Administered 2018-05-24: 650 mg via ORAL
  Filled 2018-05-24: qty 2

## 2018-05-24 NOTE — Progress Notes (Addendum)
**Note De-Identified  Obfuscation** RT note: BIPAP settings 18/8,  RR 12, 40%.  Patient refusing BIPAP HS due to claustrophobia.  Patient tolerating at this time nasal mask.   RRT to continue to monitor.

## 2018-05-24 NOTE — Progress Notes (Signed)
PT on 2L O@ with sats of 94 or higher. Continue to monitor.

## 2018-05-24 NOTE — Progress Notes (Signed)
PROGRESS NOTE    Cody Hale  OMQ:[id number] DOB: 1951-06-29 DOA: 05/21/2018 PCP: [name], MD     Brief Narrative:  67 y.o. male, with a history of atrial fibrillation on chronic anticoagulation with Xarelto, CAD, history of endocarditis, left bundle branch block, history of mitral valve replacement, chronic systolic heart failure, SVT, COPD, chronic venous insufficiency, hypertension who was just discharged from the hospital on 05/20/2018 after he was treated for altered mental status/somnolence.  Patient went to ALF and was again found to be somnolent.  He was sent to ED for further evaluation.  Patient found with acute hypercapnic respiratory failure.  Assessment & Plan: 1-altered mental status/somnolence in the setting of acute hypercapnic respiratory failure -CO2 on admission around 84 -Excellent response with the use of BiPAP -Last CO2 41.7; repeat ABG as needed. -Patient mentation back to baseline; with mild altered mental status overnight and refusing BiPAP at that time.  (Questionable sundowning component). -Continue BiPAP as needed and at bedtime. -Continue supportive care.  2-chronic systolic heart failure -Appears to be compensated -Continue Demadex -Follow daily weights and Strict intake and output  3-left lower extremity edema/erythema -Significant improvement in his swelling -There is a component of chronic venous stasis dermatitis as well as mild cellulitis -Complete antibiotic therapy using doxycycline.   -Patient denies any pain.  4-chronic atrial fibrillation -CHADsVASC score 5 -Continue beta-blockers -Continue Xarelto -rate is controlled  5-hypertension -Stable and well-controlled currently -Will follow vital signs.  6-gastroesophageal reflux disease -Continue the use of Protonix  7-hyperlipidemia -Continue the use of statins   DVT prophylaxis: Xarelto Code Status: Full code Family Communication: No family at bedside Disposition Plan:  Long discussion about importance of BiPAP usage at bedtime.  Respiratory therapy will work with patient to find comfortable either face mask or nasal probe. Will assess BIPAP settings and get machine for discharge.    Consultants:   None  Procedures:   See below for x-ray reports.  Antimicrobials:  Anti-infectives (From admission, onward)   Start     Dose/Rate Route Frequency Ordered Stop   05/23/18 1000  doxycycline (VIBRA-TABS) tablet 100 mg     100 mg Oral Every 12 hours 05/23/18 0925 05/28/18 0959   05/22/18 0215  cefTRIAXone (ROCEPHIN) 2 g in sodium chloride 0.9 % 100 mL IVPB  Status:  Discontinued     2 g 200 mL/hr over 30 Minutes Intravenous Every 24 hours 05/22/18 0208 05/23/18 0925      Subjective: Afebrile, no chest pain, no nausea, no vomiting.  Patient with mild changes in mentation by the end of the day on 05/23/2018 refused to wear BiPAP.  Objective: Vitals:   05/24/18 0500 05/24/18 0600 05/24/18 0730 05/24/18 1137  BP: 129/62 131/62    Pulse: (!) 104 95    Resp: 20 (!) 23    Temp:   98.9 F (37.2 C) 98.4 F (36.9 C)  TempSrc:   Oral Oral  SpO2: 95% 98%    Weight: 109.6 kg     Height:        Intake/Output Summary (Last 24 hours) at 05/24/2018 1330 Last data filed at 05/24/2018 1100 Gross per 24 hour  Intake 480 ml  Output 775 ml  Net -295 ml   Filed Weights   05/21/18 2146 05/24/18 0500  Weight: 112.5 kg 109.6 kg    Examination: General exam: Alert, awake, oriented x 3; denies chest pain, no nausea, no vomiting. Patient is afebrile.  Respiratory system: No crackles, good air movement bilaterally,  no wheezing.  Normal respiratory effort.   Cardiovascular system:RRR. No murmurs, rubs, gallops. Gastrointestinal system: Abdomen is nondistended, soft and nontender. No organomegaly or masses felt. Normal bowel sounds heard. Central nervous system: Alert and oriented. No focal neurological deficits. Extremities: No cyanosis or clubbing.  No erythema  appreciated on his legs; patient with chronic venous stasis dermatitis and trace edema bilaterally. Skin: No open wounds, no petechiae, erythema on his lower extremity now resolved.  Chronic dermatitis changes appreciated on both legs as mentioned above. Psychiatry: Judgement and insight appear normal. Mood & affect appropriate.    Data Reviewed: I have personally reviewed following labs and imaging studies  CBC: Recent Labs  Lab 05/19/18 1308 05/20/18 0420 05/21/18 2159 05/22/18 0527  WBC 5.4 5.6 5.0 4.8  HGB 11.8* 12.1* 13.3 12.3*  HCT 39.1 40.2 44.7 41.5  MCV 96.3 97.8 96.8 95.8  PLT 156 160 163 149*   Basic Metabolic Panel: Recent Labs  Lab 05/18/18 0518 05/19/18 1308 05/19/18 2033 05/20/18 0420 05/21/18 2159 05/22/18 0527  NA 145 138 137 141 139 143  K 3.8 3.6 3.6 3.6 3.3* 3.1*  CL 94* 92* 92* 92* 92* 94*  CO2 40* 39* 39* 40* 40* 40*  GLUCOSE 168* 180* 179* 142* 189* 157*  BUN 13 19 19 16 13 12   CREATININE 1.26* 1.18 1.19 0.93 1.05 0.87  CALCIUM 8.8* 8.1* 8.0* 8.1* 8.5* 8.5*  MG 2.1  --  1.7  --   --   --   PHOS  --   --  3.7  --   --   --    GFR: Estimated Creatinine Clearance: 100.6 mL/min (by C-G formula based on SCr of 0.87 mg/dL).   Liver Function Tests: Recent Labs  Lab 05/19/18 1308 05/20/18 0420 05/21/18 2159 05/22/18 0527  AST 32 31 27 26   ALT 19 17 19 18   ALKPHOS 73 72 85 72  BILITOT 1.0 1.1 1.0 1.0  PROT 5.4* 5.5* 6.3* 5.8*  ALBUMIN 2.7* 2.7* 3.1* 2.8*   Recent Labs  Lab 05/19/18 1537  AMMONIA 18   Cardiac Enzymes: Recent Labs  Lab 05/19/18 1308 05/19/18 2033  TROPONINI 0.03* 0.03*   CBG: Recent Labs  Lab 05/18/18 1140 05/19/18 1254 05/19/18 2218 05/20/18 0803 05/21/18 2150  GLUCAP 168* 172* 166* 135* 175*   Urine analysis:    Component Value Date/Time   COLORURINE YELLOW 05/21/2018 2251   APPEARANCEUR CLEAR 05/21/2018 2251   APPEARANCEUR Clear 07/23/2017   LABSPEC 1.010 05/21/2018 2251   PHURINE 7.0 05/21/2018 2251    GLUCOSEU NEGATIVE 05/21/2018 2251   GLUCOSEU 500 (A) 07/14/2017 1053   HGBUR NEGATIVE 05/21/2018 2251   BILIRUBINUR NEGATIVE 05/21/2018 2251   BILIRUBINUR Negative 07/23/2017   KETONESUR NEGATIVE 05/21/2018 2251   PROTEINUR NEGATIVE 05/21/2018 2251   UROBILINOGEN 0.2 07/14/2017 1053   NITRITE NEGATIVE 05/21/2018 2251   LEUKOCYTESUR NEGATIVE 05/21/2018 2251   LEUKOCYTESUR Negative (A) 07/23/2017    Recent Results (from the past 240 hour(s))  Urine culture     Status: None   Collection Time: 05/19/18  3:37 PM  Result Value Ref Range Status   Specimen Description   Final    URINE, CATHETERIZED Performed at [facility], [address], [place], [place] [address]    Special Requests   Final    Normal Performed at [facility], [address] [place], [place] [address]    Culture   Final    NO GROWTH Performed at [facility], [address] [place], [place] [address]    Report Status 05/21/2018 FINAL  Final  MRSA PCR Screening     Status: None   Collection Time: 05/19/18 11:54 PM  Result Value Ref Range Status   MRSA by PCR NEGATIVE NEGATIVE Final    Comment:        The GeneXpert MRSA Assay (FDA approved for NASAL specimens only), is one component of a comprehensive MRSA colonization surveillance program. It is not intended to diagnose MRSA infection nor to guide or monitor treatment for MRSA infections. Performed at [facility], [address] [place], [place] [address]   MRSA PCR Screening     Status: None   Collection Time: 05/22/18  5:22 AM  Result Value Ref Range Status   MRSA by PCR NEGATIVE NEGATIVE Final    Comment:        The GeneXpert MRSA Assay (FDA approved for NASAL specimens only), is one component of a comprehensive MRSA colonization surveillance program. It is not intended to diagnose MRSA infection nor to guide or monitor treatment for MRSA infections. Performed at [facility], [address] [place], [place] [address]       Radiology Studies: No results found. Scheduled Meds: . carvedilol  3.125 mg Oral BID  . chlorhexidine  15 mL Mouth Rinse BID  . doxycycline  100 mg Oral Q12H  . fluticasone  2 spray Each Nare Daily  . mouth rinse  15 mL Mouth Rinse q12n4p  . metoCLOPramide  5 mg Oral QID  . midodrine  5 mg Oral TID  . pantoprazole  40 mg Oral Daily  . polyethylene glycol  17 g Oral Daily  . rivaroxaban  20 mg Oral Daily  . simvastatin  20 mg Oral q morning - 10a  . sodium chloride flush  3 mL Intravenous Q12H  . torsemide  20 mg Oral BID   Continuous Infusions: . sodium chloride       LOS: 2 days    Time spent: 30 minutes   Vassie Loll, MD [facility] Pager [phone]  If 7PM-7AM, please contact night-coverage [email or web address] Password [email or web address] 05/24/2018, 1:30 PM

## 2018-05-24 NOTE — Progress Notes (Signed)
Pt c/o headache- X. Blount, MD paged to see if he could order Tylenol. Waiting for call back/orders.

## 2018-05-25 NOTE — Progress Notes (Signed)
PROGRESS NOTE    Cody Hale  ZOX:[id number] DOB: 1951-06-22 DOA: 05/21/2018 PCP: [name], MD     Brief Narrative:  67 y.o. male, with a history of atrial fibrillation on chronic anticoagulation with Xarelto, CAD, history of endocarditis, left bundle branch block, history of mitral valve replacement, chronic systolic heart failure, SVT, COPD, chronic venous insufficiency, hypertension who was just discharged from the hospital on 05/20/2018 after he was treated for altered mental status/somnolence.  Patient went to ALF and was again found to be somnolent.  He was sent to ED for further evaluation.  Patient found with acute hypercapnic respiratory failure.  Assessment & Plan: 1-altered mental status/somnolence in the setting of acute hypercapnic respiratory failure -CO2 on admission around 84 -Excellent response with the use of BiPAP -Last CO2 41.7; will repeat ABG as needed. -Patient mentation back to baseline; patient has been compliant with BiPAP at nighttime and expressed to follow instructions religiously. -BiPAP settings: Respiratory rate of twelve 18/8, 4 L nasal cannula bleed in and 40%. -Continue supportive care.  2-chronic systolic heart failure -Appears to be compensated -Continue Demadex -Follow daily weights and Strict intake and output  3-left lower extremity edema/erythema -Significant improvement in his swelling -There is a component of chronic venous stasis dermatitis as well as mild cellulitis -Complete antibiotic therapy using doxycycline.   -Patient denies any pain.  4-chronic atrial fibrillation -CHADsVASC score 5 -Continue beta-blockers -Continue Xarelto -rate is controlled  5-hypertension -Stable and well-controlled currently -Will follow vital signs.  6-gastroesophageal reflux disease -Continue the use of Protonix  7-hyperlipidemia -Continue the use of statins  8-physical deconditioning -Appreciate evaluation by PT -Will pursuit  skilled nursing facility placement for rehabilitation and conditioning.  DVT prophylaxis: Xarelto Code Status: Full code Family Communication: No family at bedside Disposition Plan: Patient will be transferred to MedSurg.  Continue the use of BiPAP at nighttime religiously.  After being seen by physical therapy will require skilled nursing facility for rehabilitation and conditioning.  Consultants:   None  Procedures:   See below for x-ray reports.  Antimicrobials:  Anti-infectives (From admission, onward)   Start     Dose/Rate Route Frequency Ordered Stop   05/23/18 1000  doxycycline (VIBRA-TABS) tablet 100 mg     100 mg Oral Every 12 hours 05/23/18 0925 05/28/18 0959   05/22/18 0215  cefTRIAXone (ROCEPHIN) 2 g in sodium chloride 0.9 % 100 mL IVPB  Status:  Discontinued     2 g 200 mL/hr over 30 Minutes Intravenous Every 24 hours 05/22/18 0208 05/23/18 0925      Subjective: No fever, no chest pain, no overnight events.  Patient reports feeling good and was inquiring about discharge.  He uses BiPAP at nighttime.  Objective: Vitals:   05/25/18 1143 05/25/18 1200 05/25/18 1300 05/25/18 1400  BP:  102/61 119/70 (!) 134/53  Pulse: 76 79 95 85  Resp: 16 18 20  (!) 28  Temp: 98.6 F (37 C)     TempSrc: Oral     SpO2: 98% 97% 97% (!) 89%  Weight:      Height:        Intake/Output Summary (Last 24 hours) at 05/25/2018 1614 Last data filed at 05/25/2018 1200 Gross per 24 hour  Intake 603 ml  Output 1475 ml  Net -872 ml   Filed Weights   05/21/18 2146 05/24/18 0500 05/25/18 0500  Weight: 112.5 kg 109.6 kg 109.2 kg    Examination: General exam: Alert, awake, oriented x 3; denies chest pain,  no nausea, no vomiting.  Patient is afebrile and reported using BiPAP at nighttime. Respiratory system: Clear to auscultation. Respiratory effort normal. Cardiovascular system:RRR. No murmurs, rubs, gallops. Gastrointestinal system: Abdomen is nondistended, soft and nontender. No  organomegaly or masses felt. Normal bowel sounds heard. Central nervous system: Alert and oriented. No focal neurological deficits. Extremities: No cyanosis or clubbing; trace edema appreciated bilaterally.   Skin: No rashes, no open wounds.  Mild chronic venous stasis dermatitis appreciated on his lower extremities. Psychiatry: Judgement and insight appear normal. Mood & affect appropriate.   Data Reviewed: I have personally reviewed following labs and imaging studies  CBC: Recent Labs  Lab 05/19/18 1308 05/20/18 0420 05/21/18 2159 05/22/18 0527  WBC 5.4 5.6 5.0 4.8  HGB 11.8* 12.1* 13.3 12.3*  HCT 39.1 40.2 44.7 41.5  MCV 96.3 97.8 96.8 95.8  PLT 156 160 163 149*   Basic Metabolic Panel: Recent Labs  Lab 05/19/18 1308 05/19/18 2033 05/20/18 0420 05/21/18 2159 05/22/18 0527  NA 138 137 141 139 143  K 3.6 3.6 3.6 3.3* 3.1*  CL 92* 92* 92* 92* 94*  CO2 39* 39* 40* 40* 40*  GLUCOSE 180* 179* 142* 189* 157*  BUN 19 19 16 13 12   CREATININE 1.18 1.19 0.93 1.05 0.87  CALCIUM 8.1* 8.0* 8.1* 8.5* 8.5*  MG  --  1.7  --   --   --   PHOS  --  3.7  --   --   --    GFR: Estimated Creatinine Clearance: 100.3 mL/min (by C-G formula based on SCr of 0.87 mg/dL).   Liver Function Tests: Recent Labs  Lab 05/19/18 1308 05/20/18 0420 05/21/18 2159 05/22/18 0527  AST 32 31 27 26   ALT 19 17 19 18   ALKPHOS 73 72 85 72  BILITOT 1.0 1.1 1.0 1.0  PROT 5.4* 5.5* 6.3* 5.8*  ALBUMIN 2.7* 2.7* 3.1* 2.8*   Recent Labs  Lab 05/19/18 1537  AMMONIA 18   Cardiac Enzymes: Recent Labs  Lab 05/19/18 1308 05/19/18 2033  TROPONINI 0.03* 0.03*   CBG: Recent Labs  Lab 05/19/18 1254 05/19/18 2218 05/20/18 0803 05/21/18 2150  GLUCAP 172* 166* 135* 175*   Urine analysis:    Component Value Date/Time   COLORURINE YELLOW 05/21/2018 2251   APPEARANCEUR CLEAR 05/21/2018 2251   APPEARANCEUR Clear 07/23/2017   LABSPEC 1.010 05/21/2018 2251   PHURINE 7.0 05/21/2018 2251   GLUCOSEU  NEGATIVE 05/21/2018 2251   GLUCOSEU 500 (A) 07/14/2017 1053   HGBUR NEGATIVE 05/21/2018 2251   BILIRUBINUR NEGATIVE 05/21/2018 2251   BILIRUBINUR Negative 07/23/2017   KETONESUR NEGATIVE 05/21/2018 2251   PROTEINUR NEGATIVE 05/21/2018 2251   UROBILINOGEN 0.2 07/14/2017 1053   NITRITE NEGATIVE 05/21/2018 2251   LEUKOCYTESUR NEGATIVE 05/21/2018 2251   LEUKOCYTESUR Negative (A) 07/23/2017    Recent Results (from the past 240 hour(s))  Urine culture     Status: None   Collection Time: 05/19/18  3:37 PM  Result Value Ref Range Status   Specimen Description   Final    URINE, CATHETERIZED Performed at [facility], [address], [place], [place] [address]    Special Requests   Final    Normal Performed at [facility], [address], [place], [place] [address]    Culture   Final    NO GROWTH Performed at [facility], [address], [place], [place] [address]    Report Status 05/21/2018 FINAL  Final  MRSA PCR Screening  Status: None   Collection Time: 05/19/18 11:54 PM  Result Value Ref Range Status   MRSA by PCR NEGATIVE NEGATIVE Final    Comment:        The GeneXpert MRSA Assay (FDA approved for NASAL specimens only), is one component of a comprehensive MRSA colonization surveillance program. It is not intended to diagnose MRSA infection nor to guide or monitor treatment for MRSA infections. Performed at [facility], [address], [place], [place] [address]   MRSA PCR Screening     Status: None   Collection Time: 05/22/18  5:22 AM  Result Value Ref Range Status   MRSA by PCR NEGATIVE NEGATIVE Final    Comment:        The GeneXpert MRSA Assay (FDA approved for NASAL specimens only), is one component of a comprehensive MRSA colonization surveillance program. It is not intended to diagnose MRSA infection nor to guide or monitor treatment for MRSA infections. Performed at [facility], [address], [place], [place] [address]      Radiology  Studies: No results found. Scheduled Meds: . carvedilol  3.125 mg Oral BID  . chlorhexidine  15 mL Mouth Rinse BID  . doxycycline  100 mg Oral Q12H  . fluticasone  2 spray Each Nare Daily  . mouth rinse  15 mL Mouth Rinse q12n4p  . metoCLOPramide  5 mg Oral QID  . midodrine  5 mg Oral TID  . pantoprazole  40 mg Oral Daily  . polyethylene glycol  17 g Oral Daily  . rivaroxaban  20 mg Oral Daily  . simvastatin  20 mg Oral q morning - 10a  . sodium chloride flush  3 mL Intravenous Q12H  . torsemide  20 mg Oral BID   Continuous Infusions: . sodium chloride       LOS: 3 days    Time spent: 30 minutes   Vassie Loll, MD [facility] Pager [phone]  If 7PM-7AM, please contact night-coverage [email or web address] Password [email or web address] 05/25/2018, 4:14 PM

## 2018-05-25 NOTE — Progress Notes (Signed)
PT had a 13beat run of Vtach. MD paged. Continue to monitor. No further orders at this time.

## 2018-05-25 NOTE — NC FL2 (Signed)
[place] MEDICAID FL2 LEVEL OF CARE SCREENING TOOL     IDENTIFICATION  Patient Name: Cody Hale Birthdate: 07/27/1950 Sex: male Admission Date (Current Location): 05/21/2018  [facility] and [place] Number:  [employer] and Address:  [facility],  [address] [place] [address]      Provider Number: [id number]  Attending Physician Name and Address:  Vassie Loll, MD  Relative Name and Phone Number:       Current Level of Care: Hospital Recommended Level of Care: Skilled Nursing Facility Prior Approval Number: [id number]  Date Approved/Denied: 01/17/18 PASRR Number:    Discharge Plan: SNF    Current Diagnoses: Patient Active Problem List   Diagnosis Date Noted  . Acute hypercapnic respiratory failure (HCC) 05/22/2018  . Pressure injury of skin 05/20/2018  . Altered mental status 05/19/2018  . Major depressive disorder, recurrent severe without psychotic features (HCC) 05/19/2018  . CHF (congestive heart failure) (HCC) 05/17/2018  . Acute on chronic systolic (congestive) heart failure (HCC) 05/16/2018  . GERD with esophagitis 04/22/2018  . Nonischemic cardiomyopathy (HCC) 03/23/2018  . Therapeutic opioid-induced constipation (OIC) 12/04/2017  . Venous stasis dermatitis of both lower extremities 07/31/2017  . Unilateral primary osteoarthritis, right hip 04/22/2017  . 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 Unilateral primary osteoarthritis, right knee 09/18/2016  . Acute on chronic systolic CHF (congestive heart failure) (HCC) 05/05/2016  . Seasonal allergic rhinitis due to pollen 03/13/2016  . Chronic systolic heart failure (HCC) 01/11/2015  . Chronic atrial fibrillation 01/11/2015  . S/P mitral valve replacement with bioprosthetic valve 01/11/2015  . Depression with somatization 11/22/2014  . Primary osteoarthritis of both knees 11/22/2014  . COPD (chronic obstructive pulmonary disease) with chronic bronchitis (HCC)  05/12/2014  . B12 deficiency anemia 02/03/2014  . Routine general medical examination at a health care facility 03/21/2013  . Hyperlipidemia with target LDL less than 70 03/19/2013  . Essential hypertension, malignant 12/01/2012  . Type II diabetes mellitus with manifestations (HCC)   . Hypertension   . Gastroparesis due to DM (HCC) 11/30/2012  . OSA (obstructive sleep apnea) 11/16/2012  . Endocarditis-resolved 08/26/2012  . LBBB (left bundle branch block) 07/03/2012  . Morbid obesity (HCC) 06/27/2012    Orientation RESPIRATION BLADDER Height & Weight     Self, Time, Situation, Place  O2, Other (Comment)(see dc summary, pt needs to use bipap for naps and overnight sleep) Continent Weight: 240 lb 11.9 oz (109.2 kg) Height:  5\' 9"  (175.3 cm)  BEHAVIORAL SYMPTOMS/MOOD NEUROLOGICAL BOWEL NUTRITION STATUS      Continent Diet(see dc summary)  AMBULATORY STATUS COMMUNICATION OF NEEDS Skin   Extensive Assist Verbally Normal                       Personal Care Assistance Level of Assistance    Bathing Assistance: Limited assistance Feeding assistance: Independent Dressing Assistance: Limited assistance     Functional Limitations Info    Sight Info: Adequate Hearing Info: Adequate Speech Info: Adequate    SPECIAL CARE FACTORS FREQUENCY  PT (By licensed PT)     PT Frequency: 5 times week              Contractures Contractures Info: Not present    Additional Factors Info    Code Status Info: full Allergies Info: Daptomycine, Lisinopril, Tape           Current Medications (05/25/2018):  This is the current hospital active medication list Current  Facility-Administered Medications  Medication Dose Route Frequency Provider Last Rate Last Dose  . 0.9 %  sodium chloride infusion  250 mL Intravenous PRN Meredeth Ide, MD      . acetaminophen (TYLENOL) tablet 650 mg  650 mg Oral Q6H PRN Audrea Muscat T, NP   650 mg at 05/24/18 2251  . carvedilol (COREG) tablet 3.125  mg  3.125 mg Oral BID Meredeth Ide, MD   3.125 mg at 05/25/18 0800  . chlorhexidine (PERIDEX) 0.12 % solution 15 mL  15 mL Mouth Rinse BID Vassie Loll, MD   15 mL at 05/25/18 0929  . doxycycline (VIBRA-TABS) tablet 100 mg  100 mg Oral Q12H Vassie Loll, MD   100 mg at 05/25/18 3086  . fluticasone (FLONASE) 50 MCG/ACT nasal spray 2 spray  2 spray Each Nare Daily Meredeth Ide, MD   2 spray at 05/25/18 0931  . MEDLINE mouth rinse  15 mL Mouth Rinse q12n4p Vassie Loll, MD   15 mL at 05/24/18 1625  . metoCLOPramide (REGLAN) tablet 5 mg  5 mg Oral QID Meredeth Ide, MD   5 mg at 05/25/18 0929  . midodrine (PROAMATINE) tablet 5 mg  5 mg Oral TID Meredeth Ide, MD   5 mg at 05/25/18 0800  . ondansetron (ZOFRAN) tablet 4 mg  4 mg Oral Q6H PRN Meredeth Ide, MD       Or  . ondansetron (ZOFRAN) injection 4 mg  4 mg Intravenous Q6H PRN Meredeth Ide, MD      . pantoprazole (PROTONIX) EC tablet 40 mg  40 mg Oral Daily Meredeth Ide, MD   40 mg at 05/25/18 0931  . polyethylene glycol (MIRALAX / GLYCOLAX) packet 17 g  17 g Oral Daily Meredeth Ide, MD   17 g at 05/23/18 0911  . rivaroxaban (XARELTO) tablet 20 mg  20 mg Oral Daily Meredeth Ide, MD   20 mg at 05/25/18 5784  . simvastatin (ZOCOR) tablet 20 mg  20 mg Oral q morning - 10a Meredeth Ide, MD   20 mg at 05/25/18 6962  . sodium chloride flush (NS) 0.9 % injection 3 mL  3 mL Intravenous Q12H Meredeth Ide, MD   3 mL at 05/25/18 0933  . sodium chloride flush (NS) 0.9 % injection 3 mL  3 mL Intravenous PRN Meredeth Ide, MD      . torsemide (DEMADEX) tablet 20 mg  20 mg Oral BID Meredeth Ide, MD   20 mg at 05/25/18 0800     Discharge Medications: Please see discharge summary for a list of discharge medications.  Relevant Imaging Results:  Relevant Lab Results:   Additional Information SSN: [id number], [place]

## 2018-05-25 NOTE — Evaluation (Signed)
Physical Therapy Evaluation Patient Details Name: Cody Hale MRN: [id number] DOB: 05/06/1951 Today's Date: 05/25/2018   History of Present Illness  Idriss Boateng  is a 67 y.o. male, with a history of atrial fibrillation on chronic anticoagulation with Xarelto, CAD, history of endocarditis, left bundle branch block, history of mitral valve replacement, chronic systolic heart failure, SVT, COPD, chronic venous insufficiency, hypertension who was just discharged from the hospital on 05/20/2018 after he was treated for altered mental status/somnolence.  Patient went to ALF and was again found to be somnolent.  He was sent to ED for further evaluation.    Clinical Impression  Patient demonstrates slow labored movement for sitting up at bedside, sit to stands and transfers, limited for ambulation due to c/o fatigue while on 2 LPM with O2 saturation between 92-96%, no loss of balance and tolerated sitting up in chair after therapy.  Patient will benefit from continued physical therapy in hospital and recommended venue below to increase strength, balance, endurance for safe ADLs and gait.    Follow Up Recommendations SNF;Supervision/Assistance - 24 hour;Supervision for mobility/OOB    Equipment Recommendations  None recommended by PT    Recommendations for Other Services       Precautions / Restrictions Precautions Precautions: Fall Restrictions Weight Bearing Restrictions: No      Mobility  Bed Mobility Overal bed mobility: Needs Assistance Bed Mobility: Supine to Sit     Supine to sit: Min guard     General bed mobility comments: labored movement  Transfers Overall transfer level: Needs assistance Equipment used: Rolling walker (2 wheeled) Transfers: Sit to/from [employer] Sit to Stand: Min assist Stand pivot transfers: Min assist       General transfer comment: labored movement  Ambulation/Gait Ambulation/Gait assistance: Min assist Gait Distance  (Feet): 15 Feet Assistive device: Rolling walker (2 wheeled) Gait Pattern/deviations: Decreased step length - right;Decreased step length - left;Decreased stride length Gait velocity: decreased   General Gait Details: slow labored cadence without loss of balance, limited mostly due to c/o fatigue/SOB, on 2 LPM with O2 sats at 93-95%  Stairs            Wheelchair Mobility    Modified Rankin (Stroke Patients Only)       Balance Overall balance assessment: Needs assistance Sitting-balance support: Feet supported;No upper extremity supported Sitting balance-Leahy Scale: Good     Standing balance support: Bilateral upper extremity supported;During functional activity Standing balance-Leahy Scale: Poor Standing balance comment: using RW                             Pertinent Vitals/Pain Pain Assessment: No/denies pain    Home Living Family/patient expects to be discharged to:: Assisted living     Type of Home: Assisted living Home Access: Level entry     Home Layout: One level Home Equipment: Cane - single point;Walker - 2 wheels;Shower seat Additional Comments: has access to DME at ALF    Prior Function Level of Independence: Needs assistance   Gait / Transfers Assistance Needed: household ambulation pushing wheelchair or using RW  ADL's / Homemaking Assistance Needed: assisted by ALF staff  Comments: no longer drives; takes a facility bus to church.     Hand Dominance        Extremity/Trunk Assessment   Upper Extremity Assessment Upper Extremity Assessment: Generalized weakness    Lower Extremity Assessment Lower Extremity Assessment: Generalized weakness    Cervical / Trunk  Assessment Cervical / Trunk Assessment: Normal  Communication   Communication: No difficulties  Cognition Arousal/Alertness: Awake/alert Behavior During Therapy: WFL for tasks assessed/performed Overall Cognitive Status: Within Functional Limits for tasks  assessed                                        General Comments      Exercises     Assessment/Plan    PT Assessment Patient needs continued PT services  PT Problem List Decreased strength;Decreased activity tolerance;Decreased mobility;Decreased balance       PT Treatment Interventions Gait training;Stair training;Functional mobility training;Therapeutic activities;Therapeutic exercise;Patient/family education    PT Goals (Current goals can be found in the Care Plan section)  Acute Rehab PT Goals Patient Stated Goal: return to ALF after rehab PT Goal Formulation: With patient Time For Goal Achievement: 06/08/18 Potential to Achieve Goals: Good    Frequency Min 3X/week   Barriers to discharge        Co-evaluation               AM-PAC PT "6 Clicks" Daily Activity  Outcome Measure Difficulty turning over in bed (including adjusting bedclothes, sheets and blankets)?: None Difficulty moving from lying on back to sitting on the side of the bed? : None Difficulty sitting down on and standing up from a chair with arms (e.g., wheelchair, bedside commode, etc,.)?: Unable Help needed moving to and from a bed to chair (including a wheelchair)?: A Lot Help needed walking in hospital room?: A Lot Help needed climbing 3-5 steps with a railing? : A Little 6 Click Score: 16    End of Session Equipment Utilized During Treatment: Gait belt;Oxygen Activity Tolerance: Patient tolerated treatment well;Patient limited by fatigue Patient left: in chair;with call bell/phone within reach Nurse Communication: Mobility status PT Visit Diagnosis: Unsteadiness on feet (R26.81);Other abnormalities of gait and mobility (R26.89);Muscle weakness (generalized) (M62.81)    Time: 1610-9604 PT Time Calculation (min) (ACUTE ONLY): 34 min   Charges:   PT Evaluation $PT Eval Moderate Complexity: 1 Mod PT Treatments $Therapeutic Activity: 23-37 mins        12:31 PM,  05/25/18 Ocie Bob, MPT Physical Therapist with [facility] [phone] office [phone] mobile phone

## 2018-05-25 NOTE — Plan of Care (Signed)
  Problem: Acute Rehab PT Goals(only PT should resolve) Goal: Pt Will Go Supine/Side To Sit Outcome: Progressing Flowsheets (Taken 05/25/2018 1232) Pt will go Supine/Side to Sit: with modified independence Goal: Patient Will Transfer Sit To/From Stand Outcome: Progressing Flowsheets (Taken 05/25/2018 1232) Patient will transfer sit to/from stand: with supervision Goal: Pt Will Transfer Bed To Chair/Chair To Bed Outcome: Progressing Flowsheets (Taken 05/25/2018 1232) Pt will Transfer Bed to Chair/Chair to Bed: with supervision Goal: Pt Will Ambulate Outcome: Progressing Flowsheets (Taken 05/25/2018 1232) Pt will Ambulate: 50 feet; with min guard assist   12:33 PM, 05/25/18 Ocie Bob, MPT Physical Therapist with [facility] [phone] office [phone] mobile phone

## 2018-05-25 NOTE — Care Management (Signed)
Left message for [facility]. Patient has signed ABN for Bipap. Will fax ABN. When Bipap order available, will fax also.  Patient has been accepted to [facility] if Bipap can be arranged.  CM will follow up tomorrow.

## 2018-05-26 ENCOUNTER — Inpatient Hospital Stay
Admission: RE | Admit: 2018-05-26 | Discharge: 2018-06-11 | Disposition: A | Discharge status: Skilled Nursing Facility | Payer: Medicare Other | Source: Ambulatory Visit | Attending: Internal Medicine | Admitting: Internal Medicine

## 2018-05-26 DIAGNOSIS — R2689 Other abnormalities of gait and mobility: Secondary | ICD-10-CM | POA: Diagnosis not present

## 2018-05-26 DIAGNOSIS — R262 Difficulty in walking, not elsewhere classified: Secondary | ICD-10-CM | POA: Diagnosis not present

## 2018-05-26 DIAGNOSIS — J9611 Chronic respiratory failure with hypoxia: Secondary | ICD-10-CM

## 2018-05-26 DIAGNOSIS — R635 Abnormal weight gain: Secondary | ICD-10-CM | POA: Diagnosis not present

## 2018-05-26 DIAGNOSIS — I110 Hypertensive heart disease with heart failure: Secondary | ICD-10-CM | POA: Diagnosis not present

## 2018-05-26 DIAGNOSIS — Z952 Presence of prosthetic heart valve: Secondary | ICD-10-CM | POA: Diagnosis not present

## 2018-05-26 DIAGNOSIS — E872 Acidosis: Secondary | ICD-10-CM | POA: Diagnosis not present

## 2018-05-26 DIAGNOSIS — I447 Left bundle-branch block, unspecified: Secondary | ICD-10-CM | POA: Diagnosis not present

## 2018-05-26 DIAGNOSIS — J449 Chronic obstructive pulmonary disease, unspecified: Secondary | ICD-10-CM | POA: Diagnosis not present

## 2018-05-26 DIAGNOSIS — M159 Polyosteoarthritis, unspecified: Secondary | ICD-10-CM | POA: Diagnosis not present

## 2018-05-26 DIAGNOSIS — G4733 Obstructive sleep apnea (adult) (pediatric): Secondary | ICD-10-CM | POA: Diagnosis not present

## 2018-05-26 DIAGNOSIS — I5042 Chronic combined systolic (congestive) and diastolic (congestive) heart failure: Secondary | ICD-10-CM | POA: Diagnosis not present

## 2018-05-26 DIAGNOSIS — I471 Supraventricular tachycardia: Secondary | ICD-10-CM | POA: Diagnosis not present

## 2018-05-26 DIAGNOSIS — M6281 Muscle weakness (generalized): Secondary | ICD-10-CM | POA: Diagnosis not present

## 2018-05-26 DIAGNOSIS — E785 Hyperlipidemia, unspecified: Secondary | ICD-10-CM | POA: Diagnosis not present

## 2018-05-26 DIAGNOSIS — L03116 Cellulitis of left lower limb: Secondary | ICD-10-CM

## 2018-05-26 DIAGNOSIS — E118 Type 2 diabetes mellitus with unspecified complications: Secondary | ICD-10-CM | POA: Diagnosis not present

## 2018-05-26 DIAGNOSIS — Z7901 Long term (current) use of anticoagulants: Secondary | ICD-10-CM | POA: Diagnosis not present

## 2018-05-26 DIAGNOSIS — I482 Chronic atrial fibrillation, unspecified: Secondary | ICD-10-CM | POA: Diagnosis not present

## 2018-05-26 DIAGNOSIS — M170 Bilateral primary osteoarthritis of knee: Secondary | ICD-10-CM | POA: Diagnosis not present

## 2018-05-26 DIAGNOSIS — K3184 Gastroparesis: Secondary | ICD-10-CM | POA: Diagnosis not present

## 2018-05-26 DIAGNOSIS — Z6835 Body mass index (BMI) 35.0-35.9, adult: Secondary | ICD-10-CM

## 2018-05-26 DIAGNOSIS — I5023 Acute on chronic systolic (congestive) heart failure: Secondary | ICD-10-CM | POA: Diagnosis not present

## 2018-05-26 DIAGNOSIS — J9692 Respiratory failure, unspecified with hypercapnia: Secondary | ICD-10-CM | POA: Diagnosis not present

## 2018-05-26 DIAGNOSIS — F329 Major depressive disorder, single episode, unspecified: Secondary | ICD-10-CM | POA: Diagnosis not present

## 2018-05-26 DIAGNOSIS — I5022 Chronic systolic (congestive) heart failure: Secondary | ICD-10-CM | POA: Diagnosis not present

## 2018-05-26 DIAGNOSIS — E876 Hypokalemia: Secondary | ICD-10-CM | POA: Diagnosis not present

## 2018-05-26 DIAGNOSIS — E1143 Type 2 diabetes mellitus with diabetic autonomic (poly)neuropathy: Secondary | ICD-10-CM | POA: Diagnosis not present

## 2018-05-26 DIAGNOSIS — D519 Vitamin B12 deficiency anemia, unspecified: Secondary | ICD-10-CM | POA: Diagnosis not present

## 2018-05-26 DIAGNOSIS — F40240 Claustrophobia: Secondary | ICD-10-CM | POA: Diagnosis not present

## 2018-05-26 DIAGNOSIS — F450 Somatization disorder: Secondary | ICD-10-CM | POA: Diagnosis not present

## 2018-05-26 DIAGNOSIS — I2510 Atherosclerotic heart disease of native coronary artery without angina pectoris: Secondary | ICD-10-CM | POA: Diagnosis not present

## 2018-05-26 DIAGNOSIS — I10 Essential (primary) hypertension: Secondary | ICD-10-CM | POA: Diagnosis not present

## 2018-05-26 DIAGNOSIS — I4821 Permanent atrial fibrillation: Secondary | ICD-10-CM | POA: Diagnosis not present

## 2018-05-26 DIAGNOSIS — W19XXXA Unspecified fall, initial encounter: Secondary | ICD-10-CM | POA: Diagnosis not present

## 2018-05-26 DIAGNOSIS — J9602 Acute respiratory failure with hypercapnia: Secondary | ICD-10-CM | POA: Diagnosis not present

## 2018-05-26 DIAGNOSIS — R279 Unspecified lack of coordination: Secondary | ICD-10-CM | POA: Diagnosis not present

## 2018-05-26 MED ORDER — POTASSIUM CHLORIDE ER 10 MEQ PO TBCR: 20.0000 meq | EXTENDED_RELEASE_TABLET | Freq: Every day | ORAL | Status: DC

## 2018-05-26 MED ORDER — DOXYCYCLINE HYCLATE 100 MG PO TABS: 100.0000 mg | ORAL_TABLET | Freq: Two times a day (BID) | ORAL | Status: AC

## 2018-05-26 NOTE — Progress Notes (Signed)
Patient alert and oriented x4. No complaints of pain, shortness of breath, chest pain, dizziness, nausea or vomiting. Patient tolerated PO meds and diet well. Appetite good. Patient up out of bed to recliner chair with stand-by assist, gait steady. Nurse to nurse report called and given to Renee at the [facility]. Patient discharged by wheelchair with all belongings to the [facility], room 132 with O2 on at 3L via Nasal cannula.

## 2018-05-26 NOTE — Care Management (Addendum)
ABN, detailed Bipap order and latest progress notes faxed over to [facility]. CSW following up with [facility] and [facility] in regards to Bipap / disposition.

## 2018-05-26 NOTE — Clinical Social Work Note (Signed)
Sister, Efraim Kaufmann, notified of discharge.   Facility notified of discharge, discharge clinicals sent.   LCSW signing off.    Celia Gibbons, Juleen China, LCSW

## 2018-05-26 NOTE — Discharge Summary (Signed)
Physician Discharge Summary  Cody Hale ZOX:[id number] DOB: Dec 08, 1950 DOA: 05/21/2018  PCP: Cody Grandchild, MD  Admit date: 05/21/2018 Discharge date: 05/26/2018  Time spent: 35 minutes  Recommendations for Outpatient Follow-up:  1. Repeat basic metabolic panel to follow electrolytes and renal function 2. Close follow-up the patient's CBGs and A1c; with initiation on hypoglycemic agents as needed 3. Arrange a sleep study as an outpatient.   Discharge Diagnoses:  Active Problems:   Class 2 severe obesity due to excess calories with serious comorbidity and body mass index (BMI) of 35.0 to 35.9 in adult [facility])   Chronic systolic CHF (congestive heart failure) (HCC)   Altered mental status   Acute respiratory failure with hypercapnia (HCC)   Left leg cellulitis   Chronic respiratory failure with hypoxia (HCC)   Discharge Condition: Stable and improved.  Discharge to skilled nursing facility for further care and conditioning.  Diet recommendation: Heart healthy diet  Filed Weights   05/21/18 2146 05/24/18 0500 05/25/18 0500  Weight: 112.5 kg 109.6 kg 109.2 kg    History of present illness:  As per H&P written by Dr. Sharl Ma on 05/21/2018 67 y.o.male,with a history of atrial fibrillation on chronic anticoagulation with Xarelto, CAD, history of endocarditis, left bundle branch block, history of mitral valve replacement, chronic systolic heart failure, SVT, COPD, chronic venous insufficiency, hypertension who was just discharged from the hospital on 05/20/2018 after he was treated for altered mental status/somnolence.Patient went to ALF and was again found to be somnolent. He was sent to ED for further evaluation.  Patient found with acute hypercapnic respiratory failure.  Hospital Course:  1-altered mental status/somnolence in the setting of acute hypercapnic respiratory failure -CO2 on admission around 84 -Excellent response with the use of BiPAP -Last CO2 41.7 -Patient  mentation back to baseline; patient has been compliant with BiPAP at nighttime and expressed to continue following usage religiously. -BiPAP settings: Respiratory rate of twelve 18/8, 4 L nasal cannula bleed in and 40%. -Continue supportive care. -Will need sleep study as an outpatient.  2-chronic systolic heart failure -Appears to be compensated -Continue Demadex -Follow daily weights and low-sodium diet.  3-left lower extremity edema/erythema -Significant improvement in his swelling -There is a component of chronic venous stasis dermatitis as well as mild cellulitis -Complete antibiotic therapy using doxycycline.   -Patient denies any pain.  4-chronic atrial fibrillation -CHADsVASC score 5 -Continue beta-blockers -Continue Xarelto -rate is controlled  5-hypertension -Stable and well-controlled currently -Will continue current antihypertensive regimen.  6-gastroesophageal reflux disease -Continue the use of Protonix  7-hyperlipidemia -Continue the use of statins  8-physical deconditioning -Appreciate evaluation by PT -Will pursuit skilled nursing facility placement for rehabilitation and conditioning.  10-hypokalemia -Patient discharged on daily potassium supplementation -Repeat basic metabolic panel in 1 week to follow electrolytes trend.  11-hyperglycemia -No prior history of diabetes -Patient will need close follow-up and discussion with his PCP to further address high blood sugars.  Procedures:  See below for x-ray reports  Consultations:  None  Discharge Exam: Vitals:   05/26/18 0900 05/26/18 1135  BP: (!) 103/91   Pulse: 78 80  Resp: 18 (!) 24  Temp:  97.6 F (36.4 C)  SpO2: 99% 94%   General exam: Alert, awake, oriented x 3; denies chest pain, no nausea, no vomiting.  Patient is afebrile and reported using BiPAP at nighttime without any difficulties. Respiratory system: Clear to auscultation. Respiratory effort normal. Cardiovascular  system:RRR. No murmurs, rubs, gallops. Gastrointestinal system: Abdomen is nondistended, soft and  nontender. No organomegaly or masses felt. Normal bowel sounds heard. Central nervous system: Alert and oriented. No focal neurological deficits. Extremities: No cyanosis or clubbing; trace edema appreciated bilaterally.   Skin: No rashes, no open wounds.  Mild chronic venous stasis dermatitis appreciated on his lower extremities. Psychiatry: Judgement and insight appear normal. Mood & affect appropriate.     Discharge Instructions   Discharge Instructions    (HEART FAILURE PATIENTS) Call MD:  Anytime you have any of the following symptoms: 1) 3 pound weight gain in 24 hours or 5 pounds in 1 week 2) shortness of breath, with or without a dry hacking cough 3) swelling in the hands, feet or stomach 4) if you have to sleep on extra pillows at night in order to breathe.   Complete by:  As directed    Diet - low sodium heart healthy   Complete by:  As directed    Discharge instructions   Complete by:  As directed    Complete antibiotic therapy as instructed (2 more days pending at discharge) Check daily weights Low-sodium diet (less than 2000 mg of sodium per day) Maintain adequate hydration Physical rehabilitation as per skilled nursing facility protocol. Patient need to use BiPAP machine every night (6 to 8 hours daily).     Allergies as of 05/26/2018      Reactions   Daptomycin Rash   Lisinopril Cough   Tape Rash, Other (See Comments)   Adhesive Tape-Burn skin.      Medication List    STOP taking these medications   metoCLOPramide 5 MG tablet Commonly known as:  REGLAN     TAKE these medications   carvedilol 3.125 MG tablet Commonly known as:  COREG Take 1 tablet (3.125 mg total) by mouth 2 (two) times daily.   DIPHENOXYLATE-ATROPINE PO Take 1 tablet by mouth 4 (four) times daily as needed. Take 1 tablet by mouth 4 times daily as needed for diarrhea or loose stool    doxycycline 100 MG tablet Commonly known as:  VIBRA-TABS Take 1 tablet (100 mg total) by mouth every 12 (twelve) hours for 2 days.   fluticasone 50 MCG/ACT nasal spray Commonly known as:  FLONASE Place 2 sprays into both nostrils daily.   guaiFENesin 200 MG/10ML Soln Take 5 mLs by mouth 3 (three) times daily as needed (cough).   levocetirizine 5 MG tablet Commonly known as:  XYZAL Take 1 tablet (5 mg total) by mouth every evening.   midodrine 10 MG tablet Commonly known as:  PROAMATINE Take 0.5 tablets (5 mg total) by mouth 3 (three) times daily.   multivitamin tablet Take 1 tablet by mouth daily.   nitroGLYCERIN 0.4 MG SL tablet Commonly known as:  NITROSTAT Place 1 tablet (0.4 mg total) under the tongue every 5 (five) minutes as needed for chest pain. Max 3 doses.   nystatin powder Commonly known as:  MYCOSTATIN/NYSTOP Apply topically 3 (three) times daily. APPLY TO GROIN AREA   ondansetron 4 MG tablet Commonly known as:  ZOFRAN Take 4 mg by mouth every 6 (six) hours as needed for nausea or vomiting.   pantoprazole 40 MG tablet Commonly known as:  PROTONIX Take 1 tablet (40 mg total) by mouth daily.   polyethylene glycol packet Commonly known as:  MIRALAX / GLYCOLAX Take 17 g by mouth daily.   potassium chloride 10 MEQ tablet Commonly known as:  K-DUR Take 2 tablets (20 mEq total) by mouth daily.   RISA-BID PROBIOTIC Tabs Take 1  tablet by mouth 2 (two) times daily.   simvastatin 20 MG tablet Commonly known as:  ZOCOR Take 1 tablet (20 mg total) by mouth every morning.   torsemide 20 MG tablet Commonly known as:  DEMADEX Take 1 tablet (20 mg total) by mouth 2 (two) times daily.   Vilazodone HCl 40 MG Tabs Commonly known as:  VIIBRYD Take 1 tablet (40 mg total) by mouth daily.   XARELTO 20 MG Tabs tablet Generic drug:  rivaroxaban TAKE 1 TABLET BY MOUTH ONCE DAILY. What changed:  how much to take            Durable Medical Equipment  (From  admission, onward)         Start     Ordered   05/25/18 1607  For home use only DME Bipap  Once    Question Answer Comment  Bleed in oxygen (LPM) 4L   Keep 02 saturation 40%   Inspiratory pressure 18   Expiratory pressure 8      05/25/18 1610         Allergies  Allergen Reactions  . Daptomycin Rash  . Lisinopril Cough  . Tape Rash and Other (See Comments)    Adhesive Tape-Burn skin.    Contact information for follow-up providers    Cody Grandchild, MD. Schedule an appointment as soon as possible for a visit in 10 day(s).   Specialty:  Internal Medicine Why:  After discharge from the skilled nursing facility. Contact information: [address] [place] [address] [phone]        Laqueta Linden, MD .   Specialty:  Cardiology Contact information: [id number] [place] [address] [phone]            Contact information for after-discharge care    Destination    [facility] Preferred SNF .   Service:  Skilled Nursing Contact information: [address] [place] [address] [phone]                 The results of significant diagnostics from this hospitalization (including imaging, microbiology, ancillary and laboratory) are listed below for reference.    Significant Diagnostic Studies: Dg Chest 1 View  Result Date: 05/19/2018 CLINICAL DATA:  Altered level of consciousness EXAM: CHEST  1 VIEW COMPARISON:  05/16/2018 FINDINGS: Cardiac shadow is enlarged. Postsurgical changes are again seen. Bibasilar atelectasis and effusions are again identified and stable. No new focal infiltrate is seen. No bony abnormality is noted. IMPRESSION: Stable bibasilar atelectasis and effusions. Electronically Signed   By: Alcide Clever M.D.   On: 05/19/2018 14:21   Dg Chest 2 View  Result Date: 05/16/2018 CLINICAL DATA:  Fall this morning. EXAM: CHEST - 2 VIEW COMPARISON:  04/17/2018 FINDINGS: Stable  enlargement of the cardiac silhouette with median sternotomy wires. Persistent densities at the left lung base compatible with pleural fluid and atelectasis. Increased densities at the right lung base and suggestive for pleural fluid with atelectasis or airspace disease. Negative for a pneumothorax. No acute bone abnormality. IMPRESSION: Bibasilar chest densities compatible with bilateral pleural effusions with atelectasis or airspace disease. Basilar chest densities have progressed, particularly on the right side compared to 04/17/2018. Electronically Signed   By: Richarda Overlie M.D.   On: 05/16/2018 09:28   Dg Shoulder Right  Result Date: 05/16/2018 CLINICAL DATA:  Fall this morning with right shoulder pain. EXAM: RIGHT SHOULDER - 2+ VIEW COMPARISON:  Chest x-ray 04/12/2018 FINDINGS:  Mild degenerative change of the [facility] joint and glenohumeral joints. No evidence of acute fracture or dislocation involving the shoulder. Several old right lateral rib fractures. IMPRESSION: No acute shoulder injury. Old right rib fractures. Electronically Signed   By: Elberta Fortis M.D.   On: 05/16/2018 08:25   Dg Elbow Complete Right  Result Date: 05/16/2018 CLINICAL DATA:  Fall this morning with right elbow pain. EXAM: RIGHT ELBOW - COMPLETE 3+ VIEW COMPARISON:  None. FINDINGS: Mild degenerate change of the right elbow joint. No evidence of acute fracture or dislocation. IMPRESSION: No acute findings. Electronically Signed   By: Elberta Fortis M.D.   On: 05/16/2018 08:26   Dg Wrist Complete Right  Result Date: 05/16/2018 CLINICAL DATA:  Fall this morning with right wrist pain. EXAM: RIGHT WRIST - COMPLETE 3+ VIEW COMPARISON:  None. FINDINGS: Mild degenerate changes over the radiocarpal joint and carpal bones as well as first carpometacarpal joint and first MCP joints. No evidence of acute fracture or dislocation. IMPRESSION: No acute findings. Electronically Signed   By: Elberta Fortis M.D.   On: 05/16/2018 08:27   Ct Head Wo  Contrast  Result Date: 05/19/2018 CLINICAL DATA:  Recent fall. EXAM: CT HEAD WITHOUT CONTRAST TECHNIQUE: Contiguous axial images were obtained from the base of the skull through the vertex without intravenous contrast. COMPARISON:  05/16/2018; 03/12/2015; 08/24/2012 FINDINGS: Brain: Gray-white differentiation is maintained. No CT evidence of acute large territory infarct. 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 Otherwise, no intraparenchymal or extra-axial mass. No intraparenchymal or extra-axial hemorrhage. Normal size and configuration of the ventricles and the basilar cisterns. No midline shift. Vascular: Intracranial atherosclerosis. Skull: No displaced calvarial fracture. Sinuses/Orbits: Limited visualization the paranasal sinuses and mastoid air cells is normal. No air-fluid levels. Other: Regional soft tissues appear normal. IMPRESSION: 1. Negative noncontrast head CT. 2. 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   On: 05/19/2018 19:40   Ct Head Wo Contrast  Result Date: 05/16/2018 CLINICAL DATA:  Fall transitioning from bed to wheelchair. Head trauma. EXAM: CT HEAD WITHOUT CONTRAST CT CERVICAL SPINE WITHOUT CONTRAST TECHNIQUE: Multidetector CT imaging of the head and cervical spine was performed following the standard protocol without intravenous contrast. Multiplanar CT image reconstructions of the cervical spine were also generated. COMPARISON:  03/12/2015 FINDINGS: CT HEAD FINDINGS Brain: 4 mm colloid cyst on image 14/3 without ventricular dilatation. Otherwise, the brainstem, cerebellum, cerebral peduncles, thalami, basal ganglia, basilar cisterns, and ventricular system appear within normal limits. No intracranial hemorrhage, mass lesion, or acute  CVA. Vascular: Atherosclerotic calcification of the left vertebral artery and of the cavernous carotid arteries. Skull: Unremarkable Sinuses/Orbits: Unremarkable Other: No supplemental non-categorized findings. CT CERVICAL SPINE FINDINGS Alignment: No vertebral subluxation is observed. Skull base and vertebrae: Extensive spur formation in the cervical spine with continuous bridging spurring at C4-C5-C6-C7-T1, and non continuous spurring anteriorly at C2-3 and C3-4 with chronic fragmentation. There is bridging spurring of the left C4-5 facet joint. No fracture observed. Soft tissues and spinal canal: Atherosclerotic calcification of the common carotid arteries. Disc levels: Severe central narrowing of the thecal sac at C3-4 due to posterior intervertebral spurring as on image 32/9, with resulting prominent central narrowing of the thecal sac Right foraminal impingement due to spurring at C2-3 and possibly C4-5. Upper chest: Suspected pleural effusions of the lung apices.  Other: No supplemental non-categorized findings. IMPRESSION: 1. No acute intracranial findings or acute cervical spine findings. 2. Suspected bilateral pleural effusions, dedicated chest radiography recommended. 3. 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 4. Diffuse idiopathic skeletal hyperostosis with prominent spurring in the cervical spine. There is severe central narrowing of the thecal sac at C3-4 due to a large posterior intervertebral spur, as well as suspected right foraminal impingement at C2-3 and C4-5. 5. Atherosclerosis. Electronically Signed   By: Gaylyn Rong M.D.   On: 05/16/2018 08:47   Ct Chest Wo Contrast  Result Date: 05/19/2018 CLINICAL DATA:  Chest pain and dyspnea. Pleurisy. EXAM: CT CHEST WITHOUT CONTRAST TECHNIQUE: Multidetector CT imaging of the chest was performed following the standard protocol without IV contrast. COMPARISON:  CXR  05/19/2018 FINDINGS: Cardiovascular: Atherosclerosis of the great vessels with conventional branching pattern. Moderate atherosclerosis of the thoracic aorta without aneurysm. Dilatation of the main pulmonary artery to 3.8 cm consistent with chronic pulmonary hypertension. Left main and three-vessel coronary arteriosclerosis is identified. Status post mitral valvular repair. Median sternotomy sutures are noted. No pericardial effusion or thickening. Heart size is enlarged. Mediastinum/Nodes: No enlarged mediastinal or axillary lymph nodes. Thyroid gland, trachea, and esophagus demonstrate no significant findings. Lungs/Pleura: Moderate bilateral pleural effusions with compressive atelectasis. Faint ground-glass opacities in the aerated lungs bilaterally compatible with stigmata of CHF. Upper Abdomen: No acute abnormality. Musculoskeletal: No chest wall mass or suspicious bone lesions identified. Spondylosis is noted of the included lower cervical, thoracic and upper lumbar spine. IMPRESSION: 1. Moderate bilateral pleural effusions with compressive atelectasis. 2. Dilatation of the main pulmonary artery to 3.8 cm consistent with chronic pulmonary hypertension. 3. Thoracolumbar spondylosis. 4. Coronary arteriosclerosis and aortic atherosclerosis. Aortic Atherosclerosis (ICD10-I70.0). Electronically Signed   By: Tollie Eth M.D.   On: 05/19/2018 16:13   Ct Cervical Spine Wo Contrast  Result Date: 05/16/2018 CLINICAL DATA:  Fall transitioning from bed to wheelchair. Head trauma. EXAM: CT HEAD WITHOUT CONTRAST CT CERVICAL SPINE WITHOUT CONTRAST TECHNIQUE: Multidetector CT imaging of the head and cervical spine was performed following the standard protocol without intravenous contrast. Multiplanar CT image reconstructions of the cervical spine were also generated. COMPARISON:  03/12/2015 FINDINGS: CT HEAD FINDINGS Brain: 4 mm colloid cyst on image 14/3 without ventricular dilatation. Otherwise, the brainstem,  cerebellum, cerebral peduncles, thalami, basal ganglia, basilar cisterns, and ventricular system appear within normal limits. No intracranial hemorrhage, mass lesion, or acute CVA. Vascular: Atherosclerotic calcification of the left vertebral artery and of the cavernous carotid arteries. Skull: Unremarkable Sinuses/Orbits: Unremarkable Other: No supplemental non-categorized findings. CT CERVICAL SPINE FINDINGS Alignment: No vertebral subluxation is observed. Skull base and vertebrae: Extensive spur formation in the cervical spine with continuous bridging spurring at C4-C5-C6-C7-T1, and non continuous spurring anteriorly at C2-3 and C3-4 with chronic fragmentation. There is bridging spurring of the left C4-5 facet joint. No fracture observed. Soft tissues and spinal canal: Atherosclerotic calcification of the common carotid arteries. Disc levels: Severe central narrowing of the thecal sac at C3-4 due to posterior intervertebral spurring as on image 32/9, with resulting prominent central narrowing of the thecal sac Right foraminal impingement due to spurring at C2-3 and possibly C4-5. Upper chest: Suspected pleural effusions of the lung apices. Other: No supplemental non-categorized findings. IMPRESSION: 1. No acute intracranial findings or acute cervical spine findings. 2. Suspected bilateral pleural effusions, dedicated chest radiography recommended. 3. Chronic 4 mm colloid cyst slightly eccentric to the right along  the third ventricle, without ventricular dilatation. 4. Diffuse idiopathic skeletal hyperostosis with prominent spurring in the cervical spine. There is severe central narrowing of the thecal sac at C3-4 due to a large posterior intervertebral spur, as well as suspected right foraminal impingement at C2-3 and C4-5. 5. Atherosclerosis. Electronically Signed   By: Gaylyn Rong M.D.   On: 05/16/2018 08:47   Dg Chest Port 1 View  Result Date: 05/21/2018 CLINICAL DATA:  Hyperglycemia.  Acute mental  status change. EXAM: PORTABLE CHEST 1 VIEW COMPARISON:  May 19, 2018 FINDINGS: Stable cardiomegaly. Hila and mediastinum are unchanged. No pneumothorax. Bilateral pleural effusions with underlying atelectasis. Mild edema. No other acute abnormalities. IMPRESSION: Cardiomegaly, small effusions, and pulmonary edema. Electronically Signed   By: Gerome Cody III M.D   On: 05/21/2018 23:21   Dg Knee Complete 4 Views Right  Result Date: 05/16/2018 CLINICAL DATA:  Fall this morning with right knee pain EXAM: RIGHT KNEE - COMPLETE 4+ VIEW COMPARISON:  None. FINDINGS: Mild tricompartmental osteoarthritic changes present no acute fracture or dislocation. No significant joint effusion. IMPRESSION: No acute fracture. Mild osteoarthritis. Electronically Signed   By: Elberta Fortis M.D.   On: 05/16/2018 08:23   Dg Hip Unilat With Pelvis 2-3 Views Right  Result Date: 05/16/2018 CLINICAL DATA:  Fall this morning with right hip pain. EXAM: DG HIP (WITH OR WITHOUT PELVIS) 2-3V RIGHT COMPARISON:  01/16/2018 FINDINGS: Right total hip arthroplasty intact and normally located. No evidence of acute fracture or dislocation. Degenerative change of the left hip. Degenerative change of the spine. Mild diffuse osteopenia. IMPRESSION: No acute findings. 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 Electronically Signed   By: Elberta Fortis M.D.   On: 05/16/2018 08:22    Microbiology: Recent Results (from the past 240 hour(s))  Urine culture     Status: None   Collection Time: 05/19/18  3:37 PM  Result Value Ref Range Status   Specimen Description   Final    URINE, CATHETERIZED Performed at [facility], [address], [place], [place] [address]    Special Requests   Final    Normal Performed at [facility], [address], [place], [place] [address]    Culture   Final    NO GROWTH Performed at [facility], [address] [place], [place] [address]    Report Status 05/21/2018 FINAL  Final  MRSA  PCR Screening     Status: None   Collection Time: 05/19/18 11:54 PM  Result Value Ref Range Status   MRSA by PCR NEGATIVE NEGATIVE Final    Comment:        The GeneXpert MRSA Assay (FDA approved for NASAL specimens only), is one component of a comprehensive MRSA colonization surveillance program. It is not intended to diagnose MRSA infection nor to guide or monitor treatment for MRSA infections. Performed at [facility], [address], [place], [place] [address]   MRSA PCR Screening     Status: None   Collection Time: 05/22/18  5:22 AM  Result Value Ref Range Status   MRSA by PCR NEGATIVE NEGATIVE Final    Comment:        The GeneXpert MRSA Assay (FDA approved for NASAL specimens only), is one component of a comprehensive MRSA colonization surveillance program. It is not intended to diagnose MRSA infection nor to guide or monitor treatment for MRSA infections. Performed at [facility], [address] [place], [place] [address]      Labs: Basic  Metabolic Panel: Recent Labs  Lab 05/19/18 2033 05/20/18 0420 05/21/18 2159 05/22/18 0527  NA 137 141 139 143  K 3.6 3.6 3.3* 3.1*  CL 92* 92* 92* 94*  CO2 39* 40* 40* 40*  GLUCOSE 179* 142* 189* 157*  BUN 19 16 13 12   CREATININE 1.19 0.93 1.05 0.87  CALCIUM 8.0* 8.1* 8.5* 8.5*  MG 1.7  --   --   --   PHOS 3.7  --   --   --    Liver Function Tests: Recent Labs  Lab 05/20/18 0420 05/21/18 2159 05/22/18 0527  AST 31 27 26   ALT 17 19 18   ALKPHOS 72 85 72  BILITOT 1.1 1.0 1.0  PROT 5.5* 6.3* 5.8*  ALBUMIN 2.7* 3.1* 2.8*    Recent Labs  Lab 05/19/18 1537  AMMONIA 18   CBC: Recent Labs  Lab 05/20/18 0420 05/21/18 2159 05/22/18 0527  WBC 5.6 5.0 4.8  HGB 12.1* 13.3 12.3*  HCT 40.2 44.7 41.5  MCV 97.8 96.8 95.8  PLT 160 163 149*   Cardiac Enzymes: Recent Labs  Lab 05/19/18 2033  TROPONINI 0.03*   BNP: BNP (last 3 results) Recent Labs    05/16/18 0956 05/19/18 1308 05/21/18 2159  BNP  404.0* 373.0* 319.0*    CBG: Recent Labs  Lab 05/19/18 2218 05/20/18 0803 05/21/18 2150  GLUCAP 166* 135* 175*    Signed:  Vassie Loll MD.  [facility] 05/26/2018, 3:05 PM

## 2018-05-26 NOTE — Care Management Important Message (Signed)
Important Message  Patient Details  Name: Cody Hale MRN: [id number] Date of Birth: Oct 05, 1950   Medicare Important Message Given:  Yes    Lavone Barrientes, Chrystine Oiler, RN 05/26/2018, 2:16 PM

## 2018-05-26 NOTE — Care Management Note (Signed)
Case Management Note  Patient Details  Name: Esaw Diel MRN: [id number] Date of Birth: [birth year]  If discussed at Long Length of Stay Meetings, dates discussed:   05/26/2018  Additional Comments:  Alonda Weaber, Chrystine Oiler, RN 05/26/2018, 3:29 PM

## 2018-05-27 ENCOUNTER — Other Ambulatory Visit: Payer: Self-pay

## 2018-05-27 ENCOUNTER — Encounter: Payer: Self-pay | Admitting: Internal Medicine

## 2018-05-27 ENCOUNTER — Other Ambulatory Visit (HOSPITAL_COMMUNITY)
Admission: RE | Admit: 2018-05-27 | Discharge: 2018-05-27 | Disposition: A | Discharge status: Home / Self Care | Payer: Medicare Other | Source: Skilled Nursing Facility | Attending: Internal Medicine | Admitting: Internal Medicine

## 2018-05-27 ENCOUNTER — Non-Acute Institutional Stay (SKILLED_NURSING_FACILITY): Payer: Medicare Other | Admitting: Internal Medicine

## 2018-05-27 DIAGNOSIS — I10 Essential (primary) hypertension: Secondary | ICD-10-CM

## 2018-05-27 DIAGNOSIS — I482 Chronic atrial fibrillation, unspecified: Secondary | ICD-10-CM

## 2018-05-27 DIAGNOSIS — J9602 Acute respiratory failure with hypercapnia: Secondary | ICD-10-CM

## 2018-05-27 DIAGNOSIS — L03116 Cellulitis of left lower limb: Secondary | ICD-10-CM

## 2018-05-27 DIAGNOSIS — I5023 Acute on chronic systolic (congestive) heart failure: Secondary | ICD-10-CM | POA: Insufficient documentation

## 2018-05-27 LAB — CBC WITH DIFFERENTIAL/PLATELET
Abs Immature Granulocytes: 0.01 10*3/uL (ref 0.00–0.07)
Basophils Absolute: 0 10*3/uL (ref 0.0–0.1)
Basophils Relative: 1 %
Eosinophils Absolute: 0.1 10*3/uL (ref 0.0–0.5)
Eosinophils Relative: 3 %
HCT: 44.3 % (ref 39.0–52.0)
Hemoglobin: 13.6 g/dL (ref 13.0–17.0)
Immature Granulocytes: 0 %
Lymphocytes Relative: 14 %
Lymphs Abs: 0.6 10*3/uL — ABNORMAL LOW (ref 0.7–4.0)
MCH: 28.5 pg (ref 26.0–34.0)
MCHC: 30.7 g/dL (ref 30.0–36.0)
MCV: 92.9 fL (ref 80.0–100.0)
Monocytes Absolute: 0.7 10*3/uL (ref 0.1–1.0)
Monocytes Relative: 16 %
Neutro Abs: 2.8 10*3/uL (ref 1.7–7.7)
Neutrophils Relative %: 66 %
Platelets: 144 10*3/uL — ABNORMAL LOW (ref 150–400)
RBC: 4.77 MIL/uL (ref 4.22–5.81)
RDW: 15.6 % — ABNORMAL HIGH (ref 11.5–15.5)
WBC: 4.3 10*3/uL (ref 4.0–10.5)
nRBC: 0 % (ref 0.0–0.2)

## 2018-05-27 LAB — BASIC METABOLIC PANEL
Anion gap: 8 (ref 5–15)
BUN: 13 mg/dL (ref 8–23)
CO2: 40 mmol/L — ABNORMAL HIGH (ref 22–32)
Calcium: 8.7 mg/dL — ABNORMAL LOW (ref 8.9–10.3)
Chloride: 92 mmol/L — ABNORMAL LOW (ref 98–111)
Creatinine, Ser: 0.85 mg/dL (ref 0.61–1.24)
GFR calc Af Amer: >60 mL/min (ref >60)
GFR calc non Af Amer: >60 mL/min (ref >60)
Glucose, Bld: 193 mg/dL — ABNORMAL HIGH (ref 70–99)
Potassium: 3.7 mmol/L (ref 3.5–5.1)
Sodium: 140 mmol/L (ref 135–145)

## 2018-05-27 MED ORDER — DIPHENOXYLATE-ATROPINE 2.5-0.025 MG PO TABS: 1.0000 | ORAL_TABLET | Freq: Four times a day (QID) | ORAL | 0 refills | Status: DC | PRN

## 2018-05-27 NOTE — Telephone Encounter (Signed)
RX Fax for [facility]@ [phone]  

## 2018-05-27 NOTE — Progress Notes (Signed)
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 19, 2018 FINDINGS: Stable cardiomegaly. Hila and mediastinum are unchanged. No pneumothorax. Bilateral pleural effusions with underlying atelectasis. Mild edema. No other acute abnormalities. IMPRESSION: Cardiomegaly, small effusions, and pulmonary edema. Electronically Signed   By: Gerome Sam III M.D   On: 05/21/2018 23:21   Dg Knee Complete 4 Views Right  Result Date: 05/16/2018 CLINICAL DATA:  Fall this morning with right knee pain EXAM: RIGHT KNEE - COMPLETE 4+ VIEW COMPARISON:  None. FINDINGS: Mild tricompartmental osteoarthritic changes present no acute fracture or dislocation. No significant joint effusion. IMPRESSION: No  acute fracture. Mild osteoarthritis. Electronically Signed   By: Elberta Fortis M.D.   On: 05/16/2018 08:23   Dg Hip Unilat With Pelvis 2-3 Views Right  Result Date: 05/16/2018 CLINICAL DATA:  Fall this morning with right hip pain. EXAM: DG HIP (WITH OR WITHOUT PELVIS) 2-3V RIGHT COMPARISON:  01/16/2018 FINDINGS: Right total hip arthroplasty intact and normally located. No evidence of  acute fracture or dislocation. Degenerative change of the left hip. Degenerative change of the spine. Mild diffuse osteopenia. IMPRESSION: No acute findings. 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 Electronically Signed   By: Elberta Fortis M.D.   On: 05/16/2018 08:22    Assessment/Plan  #1 history of hypercapnic respiratory failure-with mental status changes- he appears to have made a nice recovery from this mental status appears to be back at baseline he is pleasant and appropriate in good spirits.  Apparently he has been compliant with his BiPAP.  At this point continue supportive care and recommendation for a sleep study as an outpatient.  Marland Kitchen  He continues on chronic oxygen.  2.  History of systolic CHF --With echo done in hospital showing ejection fraction of 40-45%- he continues on Demadex with potassium supplementation will update a metabolic panel and monitor weights- he did have some hypokalemia in the hospital which is being supplemented.  3.  History of lower extremity on the left cellulitis- this appears to be improved patient states his edema is improved as well as erythema he is completing a course of doxycycline.  4.  History of atrial fibrillation this appears rate controlled on low-dose Coreg he is on Xarelto for anticoagulation.  5.  Hypertension again this appears controlled on Coreg and the Demadex at this point continue to monitor readings. Appears at times he does have orthostatic hypotension and is on Midodrine  as well  6.  History of GERD- he continues on a proton pump inhibitor   #7 history of depression continues on Viibryd----this appears to be very well controlled at this point continues to be in his usual good spirits does not complain of depression.  8.-History of hyperlipidemia he is on simvastatin LDL was 32 on lab done in October 2019.  9.  History of hyperglycemia in hospital hemoglobin A1c was 6.6 on hospital lab will check CBGs q. a.m. to  keep an eye on this-  #10 history of groin rash he continues on nystatin this will be followed by wound care and nursing.  Again will update a CBC metabolic panel for updated values especially since he is on potassium.  Clinically appears to be doing well.  Also will need to monitor his weights closely and check CBGs daily to keep an eye on his blood sugars.  [id number] note greater than 40 minutes spent assessing patient reviewing his chart and labs and coordinating and formulating a plan of care for numerous diagnoses- of note greater than 50% of time spent coordinating plan of care with input as noted above

## 2018-05-28 ENCOUNTER — Encounter: Payer: Self-pay | Admitting: Internal Medicine

## 2018-05-28 ENCOUNTER — Non-Acute Institutional Stay (SKILLED_NURSING_FACILITY): Payer: Medicare Other | Admitting: Internal Medicine

## 2018-05-28 DIAGNOSIS — J9611 Chronic respiratory failure with hypoxia: Secondary | ICD-10-CM | POA: Diagnosis not present

## 2018-05-28 DIAGNOSIS — E1143 Type 2 diabetes mellitus with diabetic autonomic (poly)neuropathy: Secondary | ICD-10-CM | POA: Diagnosis not present

## 2018-05-28 DIAGNOSIS — F450 Somatization disorder: Secondary | ICD-10-CM | POA: Diagnosis not present

## 2018-05-28 DIAGNOSIS — I10 Essential (primary) hypertension: Secondary | ICD-10-CM | POA: Diagnosis not present

## 2018-05-28 DIAGNOSIS — F329 Major depressive disorder, single episode, unspecified: Secondary | ICD-10-CM | POA: Diagnosis not present

## 2018-05-28 DIAGNOSIS — K3184 Gastroparesis: Secondary | ICD-10-CM | POA: Diagnosis not present

## 2018-05-28 DIAGNOSIS — E118 Type 2 diabetes mellitus with unspecified complications: Secondary | ICD-10-CM

## 2018-05-28 DIAGNOSIS — F32A Depression, unspecified: Secondary | ICD-10-CM

## 2018-05-28 DIAGNOSIS — G4733 Obstructive sleep apnea (adult) (pediatric): Secondary | ICD-10-CM | POA: Diagnosis not present

## 2018-05-28 DIAGNOSIS — L03116 Cellulitis of left lower limb: Secondary | ICD-10-CM
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
## 2018-06-01 ENCOUNTER — Inpatient Hospital Stay: Payer: Medicare Other | Admitting: Internal Medicine

## 2018-06-01 ENCOUNTER — Non-Acute Institutional Stay (SKILLED_NURSING_FACILITY): Payer: Medicare Other | Admitting: Internal Medicine

## 2018-06-01 ENCOUNTER — Encounter: Payer: Self-pay | Admitting: Internal Medicine

## 2018-06-01 DIAGNOSIS — W19XXXA Unspecified fall, initial encounter: Secondary | ICD-10-CM

## 2018-06-01 DIAGNOSIS — G4733 Obstructive sleep apnea (adult) (pediatric): Secondary | ICD-10-CM | POA: Diagnosis not present

## 2018-06-01 DIAGNOSIS — I482 Chronic atrial fibrillation, unspecified: Secondary | ICD-10-CM

## 2018-06-01 DIAGNOSIS — I10 Essential (primary) hypertension: Secondary | ICD-10-CM | POA: Diagnosis not present

## 2018-06-01 NOTE — Progress Notes (Signed)
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   Family/ staff Communication:   Labs/tests ordered:

## 2018-06-02 ENCOUNTER — Encounter: Payer: Self-pay | Admitting: Internal Medicine

## 2018-06-02 ENCOUNTER — Encounter (HOSPITAL_BASED_OUTPATIENT_CLINIC_OR_DEPARTMENT_OTHER): Payer: Self-pay

## 2018-06-02 ENCOUNTER — Other Ambulatory Visit: Payer: Self-pay | Admitting: *Deleted

## 2018-06-02 DIAGNOSIS — G4733 Obstructive sleep apnea (adult) (pediatric): Secondary | ICD-10-CM

## 2018-06-02 NOTE — Patient Outreach (Signed)
[facility]) Care Management  06/02/2018  Cody Hale May 24, 1951 [id number]   Onsite IDT meeting with [facility] UM and Facility staff. Patient doing well with therapy. He has a discharge date of 06/10/18, he will go for sleep study 06/18/18. He will be able to have a bipap before leaving facility. Patient was not in his room to visit.   Patient is from [facility] and will return upon discharge. No [facility] care management needs identified. Will sign off. Alben Spittle. Albertha Ghee, RN, BSN, CCM  Post Acute Chartered loss adjuster [phone]) Business Cell  [phone]) Toll Free Office

## 2018-06-04 ENCOUNTER — Other Ambulatory Visit (HOSPITAL_COMMUNITY)
Admission: RE | Admit: 2018-06-04 | Discharge: 2018-06-04 | Disposition: A | Discharge status: Home / Self Care | Payer: Medicare Other | Source: Skilled Nursing Facility | Attending: Internal Medicine | Admitting: Internal Medicine

## 2018-06-04 ENCOUNTER — Encounter: Payer: Self-pay | Admitting: Internal Medicine

## 2018-06-04 ENCOUNTER — Non-Acute Institutional Stay (SKILLED_NURSING_FACILITY): Payer: Medicare Other | Admitting: Internal Medicine

## 2018-06-04 DIAGNOSIS — I5023 Acute on chronic systolic (congestive) heart failure: Secondary | ICD-10-CM

## 2018-06-04 DIAGNOSIS — J449 Chronic obstructive pulmonary disease, unspecified: Secondary | ICD-10-CM | POA: Diagnosis not present

## 2018-06-04 DIAGNOSIS — J4489 Other specified chronic obstructive pulmonary disease: Secondary | ICD-10-CM

## 2018-06-04 DIAGNOSIS — E876 Hypokalemia: Secondary | ICD-10-CM

## 2018-06-04 DIAGNOSIS — I10 Essential (primary) hypertension: Secondary | ICD-10-CM

## 2018-06-04 DIAGNOSIS — I5022 Chronic systolic (congestive) heart failure: Secondary | ICD-10-CM | POA: Insufficient documentation

## 2018-06-04 LAB — COMPREHENSIVE METABOLIC PANEL
ALT: 18 U/L (ref 0–44)
AST: 26 U/L (ref 15–41)
Albumin: 3 g/dL — ABNORMAL LOW (ref 3.5–5.0)
Alkaline Phosphatase: 69 U/L (ref 38–126)
Anion gap: 10 (ref 5–15)
BUN: 8 mg/dL (ref 8–23)
CO2: 39 mmol/L — ABNORMAL HIGH (ref 22–32)
Calcium: 8.4 mg/dL — ABNORMAL LOW (ref 8.9–10.3)
Chloride: 92 mmol/L — ABNORMAL LOW (ref 98–111)
Creatinine, Ser: 0.83 mg/dL (ref 0.61–1.24)
GFR calc Af Amer: >60 mL/min (ref >60)
GFR calc non Af Amer: >60 mL/min (ref >60)
Glucose, Bld: 183 mg/dL — ABNORMAL HIGH (ref 70–99)
Potassium: 3.2 mmol/L — ABNORMAL LOW (ref 3.5–5.1)
Sodium: 141 mmol/L (ref 135–145)
Total Bilirubin: 1.1 mg/dL (ref 0.3–1.2)
Total Protein: 6.3 g/dL — ABNORMAL LOW (ref 6.5–8.1)

## 2018-06-04 LAB — CBC
HCT: 42.6 % (ref 39.0–52.0)
Hemoglobin: 12.8 g/dL — ABNORMAL LOW (ref 13.0–17.0)
MCH: 28.2 pg (ref 26.0–34.0)
MCHC: 30 g/dL (ref 30.0–36.0)
MCV: 93.8 fL (ref 80.0–100.0)
Platelets: 165 10*3/uL (ref 150–400)
RBC: 4.54 MIL/uL (ref 4.22–5.81)
RDW: 16.2 % — ABNORMAL HIGH (ref 11.5–15.5)
WBC: 4.7 10*3/uL (ref 4.0–10.5)
nRBC: 0 % (ref 0.0–0.2)
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
## 2018-06-05 ENCOUNTER — Non-Acute Institutional Stay (SKILLED_NURSING_FACILITY): Payer: Medicare Other | Admitting: Internal Medicine

## 2018-06-05 ENCOUNTER — Encounter: Payer: Self-pay | Admitting: Internal Medicine

## 2018-06-05 DIAGNOSIS — J449 Chronic obstructive pulmonary disease, unspecified: Secondary | ICD-10-CM

## 2018-06-05 DIAGNOSIS — R635 Abnormal weight gain: Secondary | ICD-10-CM | POA: Diagnosis not present

## 2018-06-05 DIAGNOSIS — I10 Essential (primary) hypertension: Secondary | ICD-10-CM | POA: Diagnosis not present

## 2018-06-05 DIAGNOSIS — I5022 Chronic systolic (congestive) heart failure: Secondary | ICD-10-CM
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
## 2018-06-06 ENCOUNTER — Encounter (HOSPITAL_COMMUNITY)
Admission: RE | Admit: 2018-06-06 | Discharge: 2018-06-06 | Disposition: A | Discharge status: Home / Self Care | Payer: Medicare Other | Source: Skilled Nursing Facility | Attending: Internal Medicine | Admitting: Internal Medicine

## 2018-06-06 DIAGNOSIS — I10 Essential (primary) hypertension: Secondary | ICD-10-CM | POA: Insufficient documentation

## 2018-06-06 LAB — BASIC METABOLIC PANEL
Anion gap: 11 (ref 5–15)
BUN: 9 mg/dL (ref 8–23)
CO2: 32 mmol/L (ref 22–32)
Calcium: 8.4 mg/dL — ABNORMAL LOW (ref 8.9–10.3)
Chloride: 95 mmol/L — ABNORMAL LOW (ref 98–111)
Creatinine, Ser: 0.91 mg/dL (ref 0.61–1.24)
GFR calc Af Amer: >60 mL/min (ref >60)
GFR calc non Af Amer: >60 mL/min (ref >60)
Glucose, Bld: 169 mg/dL — ABNORMAL HIGH (ref 70–99)
Potassium: 3.4 mmol/L — ABNORMAL LOW (ref 3.5–5.1)
Sodium: 138 mmol/L (ref 135–145)

## 2018-06-06 LAB — MAGNESIUM: Magnesium: 1.5 mg/dL — ABNORMAL LOW (ref 1.7–2.4)

## 2018-06-08 ENCOUNTER — Encounter (HOSPITAL_COMMUNITY)
Admission: RE | Admit: 2018-06-08 | Discharge: 2018-06-08 | Disposition: A | Discharge status: Home / Self Care | Payer: Medicare Other | Source: Skilled Nursing Facility | Attending: Internal Medicine | Admitting: Internal Medicine

## 2018-06-08 DIAGNOSIS — K3184 Gastroparesis: Secondary | ICD-10-CM | POA: Insufficient documentation

## 2018-06-08 DIAGNOSIS — J9692 Respiratory failure, unspecified with hypercapnia: Secondary | ICD-10-CM | POA: Insufficient documentation

## 2018-06-08 DIAGNOSIS — L03116 Cellulitis of left lower limb: Secondary | ICD-10-CM | POA: Insufficient documentation

## 2018-06-08 DIAGNOSIS — M159 Polyosteoarthritis, unspecified: Secondary | ICD-10-CM | POA: Insufficient documentation

## 2018-06-09 ENCOUNTER — Encounter (HOSPITAL_COMMUNITY)
Admission: RE | Admit: 2018-06-09 | Discharge: 2018-06-09 | Disposition: A | Discharge status: Home / Self Care | Payer: Medicare Other | Source: Skilled Nursing Facility | Attending: Internal Medicine | Admitting: Internal Medicine

## 2018-06-09 ENCOUNTER — Non-Acute Institutional Stay (SKILLED_NURSING_FACILITY): Payer: Medicare Other | Admitting: Internal Medicine

## 2018-06-09 ENCOUNTER — Encounter: Payer: Self-pay | Admitting: Internal Medicine

## 2018-06-09 ENCOUNTER — Ambulatory Visit: Payer: Self-pay | Admitting: Internal Medicine

## 2018-06-09 DIAGNOSIS — J449 Chronic obstructive pulmonary disease, unspecified: Secondary | ICD-10-CM

## 2018-06-09 DIAGNOSIS — I5022 Chronic systolic (congestive) heart failure: Secondary | ICD-10-CM | POA: Insufficient documentation

## 2018-06-09 DIAGNOSIS — M159 Polyosteoarthritis, unspecified: Secondary | ICD-10-CM | POA: Insufficient documentation

## 2018-06-09 DIAGNOSIS — I5023 Acute on chronic systolic (congestive) heart failure: Secondary | ICD-10-CM

## 2018-06-09 DIAGNOSIS — E785 Hyperlipidemia, unspecified: Secondary | ICD-10-CM

## 2018-06-09 DIAGNOSIS — E1143 Type 2 diabetes mellitus with diabetic autonomic (poly)neuropathy: Secondary | ICD-10-CM | POA: Insufficient documentation

## 2018-06-09 DIAGNOSIS — J9692 Respiratory failure, unspecified with hypercapnia: Secondary | ICD-10-CM | POA: Insufficient documentation

## 2018-06-09 DIAGNOSIS — K3184 Gastroparesis: Secondary | ICD-10-CM | POA: Insufficient documentation

## 2018-06-09 DIAGNOSIS — L03116 Cellulitis of left lower limb: Secondary | ICD-10-CM | POA: Insufficient documentation

## 2018-06-09 DIAGNOSIS — I482 Chronic atrial fibrillation, unspecified: Secondary | ICD-10-CM

## 2018-06-09 DIAGNOSIS — M6281 Muscle weakness (generalized): Secondary | ICD-10-CM | POA: Insufficient documentation

## 2018-06-09 DIAGNOSIS — M170 Bilateral primary osteoarthritis of knee: Secondary | ICD-10-CM | POA: Insufficient documentation

## 2018-06-09 LAB — COMPREHENSIVE METABOLIC PANEL
ALT: 17 U/L (ref 0–44)
AST: 28 U/L (ref 15–41)
Albumin: 3 g/dL — ABNORMAL LOW (ref 3.5–5.0)
Alkaline Phosphatase: 70 U/L (ref 38–126)
Anion gap: 7 (ref 5–15)
BUN: 9 mg/dL (ref 8–23)
CO2: 35 mmol/L — ABNORMAL HIGH (ref 22–32)
Calcium: 8.7 mg/dL — ABNORMAL LOW (ref 8.9–10.3)
Chloride: 97 mmol/L — ABNORMAL LOW (ref 98–111)
Creatinine, Ser: 1.02 mg/dL (ref 0.61–1.24)
GFR calc Af Amer: >60 mL/min (ref >60)
GFR calc non Af Amer: >60 mL/min (ref >60)
Glucose, Bld: 204 mg/dL — ABNORMAL HIGH (ref 70–99)
Potassium: 4.2 mmol/L (ref 3.5–5.1)
Sodium: 139 mmol/L (ref 135–145)
Total Bilirubin: 0.9 mg/dL (ref 0.3–1.2)
Total Protein: 6 g/dL — ABNORMAL LOW (ref 6.5–8.1)

## 2018-06-09 LAB — CBC WITH DIFFERENTIAL/PLATELET
Abs Immature Granulocytes: 0 10*3/uL (ref 0.00–0.07)
Basophils Absolute: 0 10*3/uL (ref 0.0–0.1)
Basophils Relative: 1 %
Eosinophils Absolute: 0.2 10*3/uL (ref 0.0–0.5)
Eosinophils Relative: 3 %
HCT: 39.1 % (ref 39.0–52.0)
Hemoglobin: 11.8 g/dL — ABNORMAL LOW (ref 13.0–17.0)
Immature Granulocytes: 0 %
Lymphocytes Relative: 14 %
Lymphs Abs: 0.6 10*3/uL — ABNORMAL LOW (ref 0.7–4.0)
MCH: 28.4 pg (ref 26.0–34.0)
MCHC: 30.2 g/dL (ref 30.0–36.0)
MCV: 94.2 fL (ref 80.0–100.0)
Monocytes Absolute: 0.5 10*3/uL (ref 0.1–1.0)
Monocytes Relative: 12 %
Neutro Abs: 3.1 10*3/uL (ref 1.7–7.7)
Neutrophils Relative %: 70 %
Platelets: 152 10*3/uL (ref 150–400)
RBC: 4.15 MIL/uL — ABNORMAL LOW (ref 4.22–5.81)
RDW: 16.3 % — ABNORMAL HIGH (ref 11.5–15.5)
WBC: 4.4 10*3/uL (ref 4.0–10.5)
nRBC: 0 % (ref 0.0–0.2)

## 2018-06-09 NOTE — Progress Notes (Signed)
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 Signed   By: Elberta Fortis M.D.   On: 05/16/2018 08:23   Dg Hip Unilat With Pelvis 2-3 Views Right  Result Date: 05/16/2018 CLINICAL DATA:  Fall this morning with right hip pain. EXAM: DG HIP (WITH OR WITHOUT  PELVIS) 2-3V RIGHT COMPARISON:  01/16/2018 FINDINGS: Right total hip arthroplasty intact and normally located. No evidence of acute fracture or dislocation. Degenerative change of the left hip. Degenerative change of the spine. Mild diffuse osteopenia. IMPRESSION: No acute findings. 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 Electronically Signed   By: Elberta Fortis M.D.   On: 05/16/2018 08:22    Assessment/Plan:   Chronic respiratory failure with hypoxia With OSA Patient tolerated BIPAP in facility and is  schdeule for Sleep study as outpatient  Gastroparesis due to DM  Was Restarted his Reglan at 5 mg TID Also on Protonix  Type II diabetes mellitus Not on any Medicines anymore. He used to be on Long g acting Insulin , Victoza and Metformin. His  Accu checks were less then 200 in facility  Essential hypertension Was stable On Coreg, Midodrine stopped Chronic Systolic Failure Repeat Echo showed EF of 45-5-0% On low dose of Coreg And  Back to his Home dose of Demadex Does have swelling in his legs but suppose to be Chronic Patient follows his weight in facility and will follow with his Cardiologist Depression with somatization Continue on Vibryd Chronic Atrial Fibrillation On Coreg and Xarelto Left leg cellulitis Finished course of Doxycycline Hyperlipidemia On Simvastatin   Patient will need follow up of his BMP . His weight needs to be monitored closely. Also Needs follow up with his Cardiology , and PCP. Patient is walking with his walker with Mild Assist      Future labs/tests needed:    Discharge Planning of more then 30 Min

## 2018-06-09 NOTE — Progress Notes (Signed)
This encounter was created in error - please disregard.

## 2018-06-10 ENCOUNTER — Telehealth: Payer: Self-pay | Admitting: Student

## 2018-06-10 ENCOUNTER — Ambulatory Visit (INDEPENDENT_AMBULATORY_CARE_PROVIDER_SITE_OTHER): Payer: Medicare Other | Admitting: Student

## 2018-06-10 ENCOUNTER — Encounter: Payer: Self-pay | Admitting: Student

## 2018-06-10 VITALS — BP 128/60 | HR 81 | Ht 69.0 in | Wt 250.0 lb

## 2018-06-10 DIAGNOSIS — I482 Chronic atrial fibrillation, unspecified: Secondary | ICD-10-CM

## 2018-06-10 DIAGNOSIS — Z952 Presence of prosthetic heart valve: Secondary | ICD-10-CM

## 2018-06-10 DIAGNOSIS — I10 Essential (primary) hypertension: Secondary | ICD-10-CM | POA: Diagnosis not present

## 2018-06-10 DIAGNOSIS — I5042 Chronic combined systolic (congestive) and diastolic (congestive) heart failure: Secondary | ICD-10-CM | POA: Diagnosis not present

## 2018-06-10 DIAGNOSIS — E785 Hyperlipidemia, unspecified: Secondary | ICD-10-CM | POA: Diagnosis not present

## 2018-06-10 MED ORDER — LOSARTAN POTASSIUM 25 MG PO TABS: 12.5000 mg | ORAL_TABLET | Freq: Every day | ORAL | 3 refills | Status: DC

## 2018-06-10 MED ORDER — TORSEMIDE 20 MG PO TABS: 20.0000 mg | ORAL_TABLET | Freq: Three times a day (TID) | ORAL | 1 refills | Status: DC

## 2018-06-10 MED ORDER — TORSEMIDE 20 MG PO TABS: ORAL_TABLET | ORAL | 3 refills | Status: DC
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
## 2018-06-10 NOTE — Patient Instructions (Addendum)
Medication Instructions:  INCREASE TORSEMIDE TO 40 MG IN THE MORNING & 20 MG IN THE EVENING  START LOSARTAN 12.5 MG DAILY  Labwork: 1 WEEK  BMET  Testing/Procedures: NONE  Follow-Up: Your physician recommends that you schedule a follow-up appointment in: 6-8 WEEKS    Any Other Special Instructions Will Be Listed Below (If Applicable).     If you need a refill on your cardiac medications before your next appointment, please call your pharmacy.

## 2018-06-10 NOTE — Telephone Encounter (Signed)
Clarification on dosage of E-Script just sent in/tg

## 2018-06-10 NOTE — Telephone Encounter (Signed)
Clarified RX

## 2018-06-11 ENCOUNTER — Other Ambulatory Visit: Payer: Self-pay | Admitting: *Deleted

## 2018-06-11 DIAGNOSIS — I5042 Chronic combined systolic (congestive) and diastolic (congestive) heart failure: Secondary | ICD-10-CM

## 2018-06-11 DIAGNOSIS — I10 Essential (primary) hypertension: Secondary | ICD-10-CM

## 2018-06-11 MED ORDER — TORSEMIDE 20 MG PO TABS: ORAL_TABLET | ORAL | 3 refills | Status: DC

## 2018-06-11 MED ORDER — LOSARTAN POTASSIUM 25 MG PO TABS: 12.5000 mg | ORAL_TABLET | Freq: Every day | ORAL | 3 refills | Status: DC

## 2018-06-12 ENCOUNTER — Encounter (HOSPITAL_COMMUNITY)
Admission: RE | Admit: 2018-06-12 | Discharge: 2018-06-12 | Disposition: A | Discharge status: Home / Self Care | Payer: Medicare Other | Source: Skilled Nursing Facility | Attending: Internal Medicine | Admitting: Internal Medicine

## 2018-06-12 DIAGNOSIS — M170 Bilateral primary osteoarthritis of knee: Secondary | ICD-10-CM | POA: Insufficient documentation

## 2018-06-12 DIAGNOSIS — J9692 Respiratory failure, unspecified with hypercapnia: Secondary | ICD-10-CM | POA: Insufficient documentation

## 2018-06-12 DIAGNOSIS — K3184 Gastroparesis: Secondary | ICD-10-CM | POA: Insufficient documentation

## 2018-06-12 DIAGNOSIS — M6281 Muscle weakness (generalized): Secondary | ICD-10-CM | POA: Insufficient documentation

## 2018-06-12 DIAGNOSIS — L03116 Cellulitis of left lower limb: Secondary | ICD-10-CM | POA: Insufficient documentation

## 2018-06-12 DIAGNOSIS — M159 Polyosteoarthritis, unspecified: Secondary | ICD-10-CM | POA: Insufficient documentation

## 2018-06-12 DIAGNOSIS — I5022 Chronic systolic (congestive) heart failure: Secondary | ICD-10-CM | POA: Insufficient documentation

## 2018-06-17 ENCOUNTER — Encounter (HOSPITAL_COMMUNITY)
Admission: RE | Admit: 2018-06-17 | Discharge: 2018-06-17 | Disposition: A | Discharge status: Home / Self Care | Payer: Medicare Other | Source: Skilled Nursing Facility | Attending: Internal Medicine | Admitting: Internal Medicine

## 2018-06-17 ENCOUNTER — Ambulatory Visit: Payer: Medicare Other | Attending: Internal Medicine | Admitting: Neurology

## 2018-06-17 DIAGNOSIS — M159 Polyosteoarthritis, unspecified: Secondary | ICD-10-CM | POA: Insufficient documentation

## 2018-06-17 DIAGNOSIS — M170 Bilateral primary osteoarthritis of knee: Secondary | ICD-10-CM | POA: Insufficient documentation

## 2018-06-17 DIAGNOSIS — G4733 Obstructive sleep apnea (adult) (pediatric): Secondary | ICD-10-CM | POA: Insufficient documentation

## 2018-06-17 DIAGNOSIS — J9692 Respiratory failure, unspecified with hypercapnia: Secondary | ICD-10-CM | POA: Insufficient documentation

## 2018-06-17 DIAGNOSIS — L03116 Cellulitis of left lower limb: Secondary | ICD-10-CM | POA: Insufficient documentation

## 2018-06-17 DIAGNOSIS — Z79899 Other long term (current) drug therapy: Secondary | ICD-10-CM | POA: Diagnosis not present

## 2018-06-17 DIAGNOSIS — Z7951 Long term (current) use of inhaled steroids: Secondary | ICD-10-CM | POA: Insufficient documentation

## 2018-06-17 DIAGNOSIS — Z7901 Long term (current) use of anticoagulants: Secondary | ICD-10-CM | POA: Diagnosis not present

## 2018-06-17 DIAGNOSIS — M6281 Muscle weakness (generalized): Secondary | ICD-10-CM | POA: Insufficient documentation

## 2018-06-17 DIAGNOSIS — K3184 Gastroparesis: Secondary | ICD-10-CM | POA: Insufficient documentation

## 2018-06-22 ENCOUNTER — Inpatient Hospital Stay: Payer: Self-pay | Admitting: Internal Medicine

## 2018-06-22 NOTE — Procedures (Signed)
Haralson A. Merlene Laughter, MD     [email or web address]             NOCTURNAL POLYSOMNOGRAPHY   LOCATION: [facility]   Patient Name: Cody, Hale Date: 06/17/2018 Gender: Male D.O.B: Jan 07, 1951 Age (years): 67 Referring Provider: Virgie Dad Height (inches): 26 Interpreting Physician: Phillips Odor MD, ABSM Weight (lbs): 250 RPSGT: Peak, Robert BMI: 37 MRN: [id number] Neck Size: 16.00 CLINICAL INFORMATION The patient is referred for a split night study with BPAP.  MEDICATIONS Medications self-administered by patient taken the night of the study : N/A  Current Outpatient Medications:  .  Balsam Peru-Castor Oil (VENELEX) OINT, Apply residents buttocks every shift, Disp: , Rfl:  .  carvedilol (COREG) 3.125 MG tablet, Take 1 tablet (3.125 mg total) by mouth 2 (two) times daily., Disp: 180 tablet, Rfl: 3 .  diphenoxylate-atropine (LOMOTIL) 2.5-0.025 MG tablet, Take 1 tablet by mouth 4 (four) times daily as needed. Take 1 tablet by mouth 4 times daily as needed for diarrhea or loose stool, Disp: 30 tablet, Rfl: 0 .  fluticasone (FLONASE) 50 MCG/ACT nasal spray, Place 2 sprays into both nostrils daily., Disp: 16 g, Rfl: 5 .  guaiFENesin (ROBITUSSIN) 100 MG/5ML SOLN, Take 5 mLs by mouth 3 (three) times daily as needed for cough or to loosen phlegm., Disp: , Rfl:  .  levocetirizine (XYZAL) 5 MG tablet, Take 1 tablet (5 mg total) by mouth every evening., Disp: 90 tablet, Rfl: 1 .  losartan (COZAAR) 25 MG tablet, Take 0.5 tablets (12.5 mg total) by mouth daily., Disp: 45 tablet, Rfl: 3 .  Magnesium Oxide 200 MG TABS, Take 200 mg by mouth 2 (two) times daily., Disp: , Rfl:  .  metoCLOPramide (REGLAN) 5 MG tablet, Take 5 mg by mouth 3 (three) times daily before meals., Disp: , Rfl:  .  Multiple Vitamins-Minerals (MULTIVITAMIN) tablet, Take 1 tablet by mouth daily., Disp: , Rfl:  .  nitroGLYCERIN (NITROSTAT) 0.4 MG SL tablet, Place 1 tablet (0.4 mg total) under the  tongue every 5 (five) minutes as needed for chest pain. Max 3 doses., Disp: 10 tablet, Rfl: 0 .  nystatin (MYCOSTATIN/NYSTOP) powder, Apply topically 3 (three) times daily. APPLY TO GROIN AREA, Disp: 15 g, Rfl: 0 .  ondansetron (ZOFRAN) 4 MG tablet, Take 4 mg by mouth every 6 (six) hours as needed for nausea or vomiting., Disp: , Rfl:  .  pantoprazole (PROTONIX) 40 MG tablet, Take 1 tablet (40 mg total) by mouth daily., Disp: 90 tablet, Rfl: 1 .  polyethylene glycol (MIRALAX) packet, Take 17 g by mouth daily., Disp: 14 each, Rfl: 0 .  potassium chloride SA (K-DUR,KLOR-CON) 20 MEQ tablet, Take 20 mEq by mouth 2 (two) times daily., Disp: , Rfl:  .  simvastatin (ZOCOR) 20 MG tablet, Take 1 tablet (20 mg total) by mouth every morning., Disp: , Rfl:  .  torsemide (DEMADEX) 20 MG tablet, Take 40 mg in the morning and 20 mg in the evening., Disp: 180 tablet, Rfl: 3 .  Vilazodone HCl (VIIBRYD) 40 MG TABS, Take 1 tablet (40 mg total) by mouth daily., Disp: , Rfl:  .  XARELTO 20 MG TABS tablet, TAKE 1 TABLET BY MOUTH ONCE DAILY., Disp: 30 tablet, Rfl: 5  SLEEP STUDY TECHNIQUE As per the AASM Manual for the Scoring of Sleep and Associated Events v2.3 (April 2016) with a hypopnea requiring 4% desaturations.  The channels recorded and monitored were frontal, central and occipital EEG, electrooculogram (EOG), submentalis EMG (chin),  nasal and oral airflow, thoracic and abdominal wall motion, anterior tibialis EMG, snore microphone, electrocardiogram, and pulse oximetry. Bi-level positive airway pressure (BiPAP) was initiated when the patient met split night criteria and was titrated according to treat sleep-disordered breathing.  RESPIRATORY PARAMETERS Diagnostic  Total AHI (/hr): 44.9 RDI (/hr): 69.6 OA Index (/hr): - CA Index (/hr): 3.9 REM AHI (/hr): N/A NREM AHI (/hr): 44.9 Supine AHI (/hr): 37.1 Non-supine AHI (/hr): 71.77 Min O2 Sat (%): 82.0 Mean O2 (%): 89.2 Time below 88%  (min): 60.9   Titration The patient was titrated on routine CPAP between 5 and 6 but was switched ultimately to bilevel pressures.  He was titrated between pressures of 8/4 to 11/7.  However, the titration was not successful with the patient so having significant residual events.     Optimal IPAP Pressure (cm):  Optimal EPAP Pressure (cm):  AHI at Optimal Pressure (/hr): N/A Min O2 at Optimal Pressure (%): 83.0 Sleep % at Optimal (%): N/A Supine % at Optimal (%): N/A     SLEEP ARCHITECTURE The study was initiated at 10:07:56 PM and terminated at 4:33:39 AM. The total recorded time was 385.7 minutes. EEG confirmed total sleep time was 189 minutes yielding a sleep efficiency of 49.0%%. Sleep onset after lights out was 13.5 minutes with a REM latency of N/A minutes. The patient spent 66.9%% of the night in stage N1 sleep, 33.1%% in stage N2 sleep, 0.0%% in stage N3 and 0% in REM. Wake after sleep onset (WASO) was 183.2 minutes. The Arousal Index was 63.5/hour.  LEG MOVEMENT DATA The total Periodic Limb Movements of Sleep (PLMS) were 0. The PLMS index was 0.0 .  CARDIAC DATA The 2 lead EKG demonstrated sinus rhythm, pacemaker generated. The mean heart rate was 100.0 beats per minute. Other EKG findings include: PVCs.    IMPRESSIONS 1. Severe obstructive sleep apnea occurred during the diagnostic portion of the study (AHI = 44.9 /hour). Attempted positive pressure titration was unsuccessful.  A formal full night titration study is recommended.  2. Abnormal sleep architecture is observed with the poor sleep efficiency, fragmented sleep, absent slow wave sleep and absent REM sleep.    Delano Metz, MD Diplomate, American Board of Sleep Medicine.  ELECTRONICALLY SIGNED ON:  06/22/2018, 9:50 AM [place] PH: [phone]   FX: [phone] [place]

## 2018-06-24 ENCOUNTER — Ambulatory Visit: Payer: Self-pay | Admitting: Gastroenterology

## 2018-06-24 DIAGNOSIS — M150 Primary generalized (osteo)arthritis: Secondary | ICD-10-CM | POA: Diagnosis not present

## 2018-06-24 DIAGNOSIS — I110 Hypertensive heart disease with heart failure: Secondary | ICD-10-CM | POA: Diagnosis not present

## 2018-06-24 DIAGNOSIS — E1143 Type 2 diabetes mellitus with diabetic autonomic (poly)neuropathy: Secondary | ICD-10-CM | POA: Diagnosis not present

## 2018-06-24 DIAGNOSIS — I5022 Chronic systolic (congestive) heart failure: Secondary | ICD-10-CM | POA: Diagnosis not present

## 2018-06-24 DIAGNOSIS — R2689 Other abnormalities of gait and mobility: Secondary | ICD-10-CM | POA: Diagnosis not present

## 2018-06-24 DIAGNOSIS — J9611 Chronic respiratory failure with hypoxia: Secondary | ICD-10-CM | POA: Diagnosis not present

## 2018-06-25 DIAGNOSIS — R2689 Other abnormalities of gait and mobility: Secondary | ICD-10-CM | POA: Diagnosis not present

## 2018-06-25 DIAGNOSIS — M150 Primary generalized (osteo)arthritis: Secondary | ICD-10-CM | POA: Diagnosis not present

## 2018-06-25 DIAGNOSIS — E1143 Type 2 diabetes mellitus with diabetic autonomic (poly)neuropathy: Secondary | ICD-10-CM | POA: Diagnosis not present

## 2018-06-25 DIAGNOSIS — I110 Hypertensive heart disease with heart failure: Secondary | ICD-10-CM | POA: Diagnosis not present

## 2018-06-25 DIAGNOSIS — I5022 Chronic systolic (congestive) heart failure: Secondary | ICD-10-CM | POA: Diagnosis not present

## 2018-06-25 DIAGNOSIS — J9611 Chronic respiratory failure with hypoxia: Secondary | ICD-10-CM | POA: Diagnosis not present

## 2018-06-26 DIAGNOSIS — E1143 Type 2 diabetes mellitus with diabetic autonomic (poly)neuropathy: Secondary | ICD-10-CM | POA: Diagnosis not present

## 2018-06-26 DIAGNOSIS — J9611 Chronic respiratory failure with hypoxia: Secondary | ICD-10-CM | POA: Diagnosis not present

## 2018-06-26 DIAGNOSIS — I5022 Chronic systolic (congestive) heart failure: Secondary | ICD-10-CM | POA: Diagnosis not present

## 2018-06-26 DIAGNOSIS — M150 Primary generalized (osteo)arthritis: Secondary | ICD-10-CM | POA: Diagnosis not present

## 2018-06-26 DIAGNOSIS — I110 Hypertensive heart disease with heart failure: Secondary | ICD-10-CM | POA: Diagnosis not present

## 2018-06-26 DIAGNOSIS — R2689 Other abnormalities of gait and mobility: Secondary | ICD-10-CM | POA: Diagnosis not present

## 2018-06-30 DIAGNOSIS — M150 Primary generalized (osteo)arthritis: Secondary | ICD-10-CM | POA: Diagnosis not present

## 2018-06-30 DIAGNOSIS — E1143 Type 2 diabetes mellitus with diabetic autonomic (poly)neuropathy: Secondary | ICD-10-CM | POA: Diagnosis not present

## 2018-06-30 DIAGNOSIS — J9611 Chronic respiratory failure with hypoxia: Secondary | ICD-10-CM | POA: Diagnosis not present

## 2018-06-30 DIAGNOSIS — R2689 Other abnormalities of gait and mobility: Secondary | ICD-10-CM | POA: Diagnosis not present

## 2018-06-30 DIAGNOSIS — I110 Hypertensive heart disease with heart failure: Secondary | ICD-10-CM | POA: Diagnosis not present

## 2018-06-30 DIAGNOSIS — I5022 Chronic systolic (congestive) heart failure: Secondary | ICD-10-CM | POA: Diagnosis not present

## 2018-07-02 DIAGNOSIS — M150 Primary generalized (osteo)arthritis: Secondary | ICD-10-CM | POA: Diagnosis not present

## 2018-07-02 DIAGNOSIS — I110 Hypertensive heart disease with heart failure: Secondary | ICD-10-CM | POA: Diagnosis not present

## 2018-07-02 DIAGNOSIS — J9611 Chronic respiratory failure with hypoxia: Secondary | ICD-10-CM | POA: Diagnosis not present

## 2018-07-02 DIAGNOSIS — I5022 Chronic systolic (congestive) heart failure: Secondary | ICD-10-CM | POA: Diagnosis not present

## 2018-07-02 DIAGNOSIS — R2689 Other abnormalities of gait and mobility: Secondary | ICD-10-CM | POA: Diagnosis not present

## 2018-07-02 DIAGNOSIS — E1143 Type 2 diabetes mellitus with diabetic autonomic (poly)neuropathy: Secondary | ICD-10-CM | POA: Diagnosis not present

## 2018-07-03 DIAGNOSIS — R2689 Other abnormalities of gait and mobility: Secondary | ICD-10-CM | POA: Diagnosis not present

## 2018-07-03 DIAGNOSIS — I5022 Chronic systolic (congestive) heart failure: Secondary | ICD-10-CM | POA: Diagnosis not present

## 2018-07-03 DIAGNOSIS — J9611 Chronic respiratory failure with hypoxia: Secondary | ICD-10-CM | POA: Diagnosis not present

## 2018-07-03 DIAGNOSIS — M150 Primary generalized (osteo)arthritis: Secondary | ICD-10-CM | POA: Diagnosis not present

## 2018-07-03 DIAGNOSIS — E1143 Type 2 diabetes mellitus with diabetic autonomic (poly)neuropathy: Secondary | ICD-10-CM | POA: Diagnosis not present

## 2018-07-03 DIAGNOSIS — I110 Hypertensive heart disease with heart failure: Secondary | ICD-10-CM | POA: Diagnosis not present

## 2018-07-06 DIAGNOSIS — R2689 Other abnormalities of gait and mobility: Secondary | ICD-10-CM | POA: Diagnosis not present

## 2018-07-06 DIAGNOSIS — M150 Primary generalized (osteo)arthritis: Secondary | ICD-10-CM | POA: Diagnosis not present

## 2018-07-06 DIAGNOSIS — I110 Hypertensive heart disease with heart failure: Secondary | ICD-10-CM | POA: Diagnosis not present

## 2018-07-06 DIAGNOSIS — J9611 Chronic respiratory failure with hypoxia: Secondary | ICD-10-CM | POA: Diagnosis not present

## 2018-07-06 DIAGNOSIS — E1143 Type 2 diabetes mellitus with diabetic autonomic (poly)neuropathy: Secondary | ICD-10-CM | POA: Diagnosis not present

## 2018-07-06 DIAGNOSIS — I5022 Chronic systolic (congestive) heart failure: Secondary | ICD-10-CM | POA: Diagnosis not present

## 2018-07-09 DIAGNOSIS — R2689 Other abnormalities of gait and mobility: Secondary | ICD-10-CM | POA: Diagnosis not present

## 2018-07-09 DIAGNOSIS — E1143 Type 2 diabetes mellitus with diabetic autonomic (poly)neuropathy: Secondary | ICD-10-CM | POA: Diagnosis not present

## 2018-07-09 DIAGNOSIS — J9611 Chronic respiratory failure with hypoxia: Secondary | ICD-10-CM | POA: Diagnosis not present

## 2018-07-09 DIAGNOSIS — I110 Hypertensive heart disease with heart failure: Secondary | ICD-10-CM | POA: Diagnosis not present

## 2018-07-09 DIAGNOSIS — I5022 Chronic systolic (congestive) heart failure: Secondary | ICD-10-CM | POA: Diagnosis not present

## 2018-07-09 DIAGNOSIS — M150 Primary generalized (osteo)arthritis: Secondary | ICD-10-CM | POA: Diagnosis not present

## 2018-07-10 DIAGNOSIS — E1143 Type 2 diabetes mellitus with diabetic autonomic (poly)neuropathy: Secondary | ICD-10-CM | POA: Diagnosis not present

## 2018-07-10 DIAGNOSIS — J9611 Chronic respiratory failure with hypoxia: Secondary | ICD-10-CM | POA: Diagnosis not present

## 2018-07-10 DIAGNOSIS — I110 Hypertensive heart disease with heart failure: Secondary | ICD-10-CM | POA: Diagnosis not present

## 2018-07-10 DIAGNOSIS — R2689 Other abnormalities of gait and mobility: Secondary | ICD-10-CM | POA: Diagnosis not present

## 2018-07-10 DIAGNOSIS — I5022 Chronic systolic (congestive) heart failure: Secondary | ICD-10-CM | POA: Diagnosis not present

## 2018-07-10 DIAGNOSIS — M150 Primary generalized (osteo)arthritis: Secondary | ICD-10-CM | POA: Diagnosis not present

## 2018-07-13 DIAGNOSIS — M150 Primary generalized (osteo)arthritis: Secondary | ICD-10-CM | POA: Diagnosis not present

## 2018-07-13 DIAGNOSIS — R2689 Other abnormalities of gait and mobility: Secondary | ICD-10-CM | POA: Diagnosis not present

## 2018-07-13 DIAGNOSIS — E1143 Type 2 diabetes mellitus with diabetic autonomic (poly)neuropathy: Secondary | ICD-10-CM | POA: Diagnosis not present

## 2018-07-13 DIAGNOSIS — J9611 Chronic respiratory failure with hypoxia: Secondary | ICD-10-CM | POA: Diagnosis not present

## 2018-07-13 DIAGNOSIS — I5022 Chronic systolic (congestive) heart failure: Secondary | ICD-10-CM | POA: Diagnosis not present

## 2018-07-13 DIAGNOSIS — I110 Hypertensive heart disease with heart failure: Secondary | ICD-10-CM | POA: Diagnosis not present

## 2018-07-14 ENCOUNTER — Ambulatory Visit: Payer: Self-pay

## 2018-07-14 DIAGNOSIS — R2689 Other abnormalities of gait and mobility: Secondary | ICD-10-CM | POA: Diagnosis not present

## 2018-07-14 DIAGNOSIS — E1143 Type 2 diabetes mellitus with diabetic autonomic (poly)neuropathy: Secondary | ICD-10-CM | POA: Diagnosis not present

## 2018-07-14 DIAGNOSIS — M150 Primary generalized (osteo)arthritis: Secondary | ICD-10-CM | POA: Diagnosis not present

## 2018-07-14 DIAGNOSIS — I110 Hypertensive heart disease with heart failure: Secondary | ICD-10-CM | POA: Diagnosis not present

## 2018-07-14 DIAGNOSIS — J9611 Chronic respiratory failure with hypoxia: Secondary | ICD-10-CM | POA: Diagnosis not present

## 2018-07-14 DIAGNOSIS — I5022 Chronic systolic (congestive) heart failure: Secondary | ICD-10-CM | POA: Diagnosis not present

## 2018-07-15 DIAGNOSIS — R2689 Other abnormalities of gait and mobility: Secondary | ICD-10-CM | POA: Diagnosis not present

## 2018-07-15 DIAGNOSIS — E1143 Type 2 diabetes mellitus with diabetic autonomic (poly)neuropathy: Secondary | ICD-10-CM | POA: Diagnosis not present

## 2018-07-15 DIAGNOSIS — M150 Primary generalized (osteo)arthritis: Secondary | ICD-10-CM | POA: Diagnosis not present

## 2018-07-15 DIAGNOSIS — J9611 Chronic respiratory failure with hypoxia: Secondary | ICD-10-CM | POA: Diagnosis not present

## 2018-07-15 DIAGNOSIS — I110 Hypertensive heart disease with heart failure: Secondary | ICD-10-CM | POA: Diagnosis not present

## 2018-07-15 DIAGNOSIS — I5022 Chronic systolic (congestive) heart failure: Secondary | ICD-10-CM | POA: Diagnosis not present

## 2018-07-16 DIAGNOSIS — J9611 Chronic respiratory failure with hypoxia: Secondary | ICD-10-CM | POA: Diagnosis not present

## 2018-07-16 DIAGNOSIS — I5022 Chronic systolic (congestive) heart failure: Secondary | ICD-10-CM | POA: Diagnosis not present

## 2018-07-16 DIAGNOSIS — R2689 Other abnormalities of gait and mobility: Secondary | ICD-10-CM | POA: Diagnosis not present

## 2018-07-16 DIAGNOSIS — E1143 Type 2 diabetes mellitus with diabetic autonomic (poly)neuropathy: Secondary | ICD-10-CM | POA: Diagnosis not present

## 2018-07-16 DIAGNOSIS — I110 Hypertensive heart disease with heart failure: Secondary | ICD-10-CM | POA: Diagnosis not present

## 2018-07-16 DIAGNOSIS — M150 Primary generalized (osteo)arthritis: Secondary | ICD-10-CM | POA: Diagnosis not present

## 2018-07-17 ENCOUNTER — Ambulatory Visit: Payer: Self-pay

## 2018-07-20 DIAGNOSIS — I5022 Chronic systolic (congestive) heart failure: Secondary | ICD-10-CM | POA: Diagnosis not present

## 2018-07-20 DIAGNOSIS — M150 Primary generalized (osteo)arthritis: Secondary | ICD-10-CM | POA: Diagnosis not present

## 2018-07-20 DIAGNOSIS — E1143 Type 2 diabetes mellitus with diabetic autonomic (poly)neuropathy: Secondary | ICD-10-CM | POA: Diagnosis not present

## 2018-07-20 DIAGNOSIS — I110 Hypertensive heart disease with heart failure: Secondary | ICD-10-CM | POA: Diagnosis not present

## 2018-07-20 DIAGNOSIS — J9611 Chronic respiratory failure with hypoxia: Secondary | ICD-10-CM | POA: Diagnosis not present

## 2018-07-20 DIAGNOSIS — R2689 Other abnormalities of gait and mobility: Secondary | ICD-10-CM | POA: Diagnosis not present

## 2018-07-21 ENCOUNTER — Other Ambulatory Visit: Payer: Self-pay | Admitting: Internal Medicine

## 2018-07-21 DIAGNOSIS — E1143 Type 2 diabetes mellitus with diabetic autonomic (poly)neuropathy: Secondary | ICD-10-CM | POA: Diagnosis not present

## 2018-07-21 DIAGNOSIS — M150 Primary generalized (osteo)arthritis: Secondary | ICD-10-CM | POA: Diagnosis not present

## 2018-07-21 DIAGNOSIS — J9611 Chronic respiratory failure with hypoxia: Secondary | ICD-10-CM | POA: Diagnosis not present

## 2018-07-21 DIAGNOSIS — I110 Hypertensive heart disease with heart failure: Secondary | ICD-10-CM | POA: Diagnosis not present

## 2018-07-21 DIAGNOSIS — R2689 Other abnormalities of gait and mobility: Secondary | ICD-10-CM | POA: Diagnosis not present

## 2018-07-21 DIAGNOSIS — I5022 Chronic systolic (congestive) heart failure: Secondary | ICD-10-CM | POA: Diagnosis not present
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
## 2018-07-22 ENCOUNTER — Ambulatory Visit (INDEPENDENT_AMBULATORY_CARE_PROVIDER_SITE_OTHER): Payer: Medicare Other | Admitting: Internal Medicine

## 2018-07-22 ENCOUNTER — Ambulatory Visit (INDEPENDENT_AMBULATORY_CARE_PROVIDER_SITE_OTHER): Payer: Medicare Other | Admitting: *Deleted

## 2018-07-22 ENCOUNTER — Encounter: Payer: Self-pay | Admitting: Internal Medicine

## 2018-07-22 ENCOUNTER — Other Ambulatory Visit (INDEPENDENT_AMBULATORY_CARE_PROVIDER_SITE_OTHER): Payer: Medicare Other

## 2018-07-22 VITALS — BP 126/66 | HR 73 | Ht 69.0 in | Wt 256.0 lb

## 2018-07-22 VITALS — BP 126/66 | HR 73 | Temp 98.8°F | Ht 69.0 in | Wt 256.0 lb

## 2018-07-22 DIAGNOSIS — I10 Essential (primary) hypertension: Secondary | ICD-10-CM

## 2018-07-22 DIAGNOSIS — E118 Type 2 diabetes mellitus with unspecified complications: Secondary | ICD-10-CM

## 2018-07-22 DIAGNOSIS — R300 Dysuria: Secondary | ICD-10-CM | POA: Diagnosis not present

## 2018-07-22 DIAGNOSIS — Z0000 Encounter for general adult medical examination without abnormal findings: Secondary | ICD-10-CM | POA: Diagnosis not present

## 2018-07-22 DIAGNOSIS — I5022 Chronic systolic (congestive) heart failure: Secondary | ICD-10-CM

## 2018-07-22 DIAGNOSIS — I482 Chronic atrial fibrillation, unspecified: Secondary | ICD-10-CM

## 2018-07-22 DIAGNOSIS — D510 Vitamin B12 deficiency anemia due to intrinsic factor deficiency: Secondary | ICD-10-CM | POA: Diagnosis not present

## 2018-07-22 LAB — BASIC METABOLIC PANEL
BUN: 17 mg/dL (ref 6–23)
CO2: 36 mEq/L — ABNORMAL HIGH (ref 19–32)
Calcium: 9.8 mg/dL (ref 8.4–10.5)
Chloride: 96 mEq/L (ref 96–112)
Creatinine, Ser: 1.13 mg/dL (ref 0.40–1.50)
GFR: 68.65 mL/min (ref >60.00)
Glucose, Bld: 209 mg/dL — ABNORMAL HIGH (ref 70–99)
Potassium: 4.5 mEq/L (ref 3.5–5.1)
Sodium: 138 mEq/L (ref 135–145)

## 2018-07-22 LAB — POCT GLYCOSYLATED HEMOGLOBIN (HGB A1C): Hemoglobin A1C: 7.8 % — AB (ref 4.0–5.6)

## 2018-07-22 LAB — MICROALBUMIN / CREATININE URINE RATIO
Creatinine,U: 57.8 mg/dL
Microalb Creat Ratio: 2.9 mg/g (ref 0.0–30.0)
Microalb, Ur: 1.7 mg/dL (ref 0.0–1.9)

## 2018-07-22 LAB — CBC WITH DIFFERENTIAL/PLATELET
Basophils Absolute: 0 10*3/uL (ref 0.0–0.1)
Basophils Relative: 0.8 % (ref 0.0–3.0)
Eosinophils Absolute: 0.1 10*3/uL (ref 0.0–0.7)
Eosinophils Relative: 2.3 % (ref 0.0–5.0)
HCT: 37.1 % — ABNORMAL LOW (ref 39.0–52.0)
Hemoglobin: 12.1 g/dL — ABNORMAL LOW (ref 13.0–17.0)
Lymphocytes Relative: 13.2 % (ref 12.0–46.0)
Lymphs Abs: 0.7 10*3/uL (ref 0.7–4.0)
MCHC: 32.6 g/dL (ref 30.0–36.0)
MCV: 91 fl (ref 78.0–100.0)
Monocytes Absolute: 0.7 10*3/uL (ref 0.1–1.0)
Monocytes Relative: 12.2 % — ABNORMAL HIGH (ref 3.0–12.0)
Neutro Abs: 4 10*3/uL (ref 1.4–7.7)
Neutrophils Relative %: 71.5 % (ref 43.0–77.0)
Platelets: 152 10*3/uL (ref 150.0–400.0)
RBC: 4.08 Mil/uL — ABNORMAL LOW (ref 4.22–5.81)
RDW: 16.2 % — ABNORMAL HIGH (ref 11.5–15.5)
WBC: 5.6 10*3/uL (ref 4.0–10.5)

## 2018-07-22 LAB — POCT GLUCOSE (DEVICE FOR HOME USE): POC Glucose: 254 mg/dl — AB (ref 70–99)

## 2018-07-22 LAB — TSH: TSH: 1.54 u[IU]/mL (ref 0.35–4.50)

## 2018-07-22 MED ORDER — DAPAGLIFLOZIN PROPANEDIOL 10 MG PO TABS: 10.0000 mg | ORAL_TABLET | Freq: Every day | ORAL | 1 refills | Status: DC

## 2018-07-22 MED ORDER — INSULIN DEGLUDEC 200 UNIT/ML ~~LOC~~ SOPN: 20.0000 [IU] | PEN_INJECTOR | Freq: Every day | SUBCUTANEOUS | 2 refills | Status: DC

## 2018-07-22 MED ORDER — CYANOCOBALAMIN 1000 MCG/ML IJ SOLN
1000.0000 ug | Freq: Once | INTRAMUSCULAR | Status: AC
  Administered 2018-07-22: 1000 ug via INTRAMUSCULAR

## 2018-07-22 MED ORDER — PEN NEEDLES 30G X 8 MM MISC: 1.0000 "pen " | Freq: Every day | 3 refills | Status: DC

## 2018-07-22 NOTE — Progress Notes (Signed)
Subjective:  Patient ID: Cody Hale, male    DOB: [birth year]  Age: 68 y.o. MRN: [id number]  CC: Hypertension; Diabetes; Atrial Fibrillation; and Anemia   HPI Cody Hale presents for f/up - He complains that his blood sugars have not been well controlled and he has gained weight.  Ells me his blood sugars have been consistently above 200s and he has had some polys.  His lower extremity edema is at his baseline.  He dizziness, lightheadedness, chest pain, or shortness of breath.  Outpatient Medications Prior to Visit  Medication Sig Dispense Refill  . Balsam [place]-Castor Oil (VENELEX) OINT Apply residents buttocks every shift    . carvedilol (COREG) 3.125 MG tablet Take 1 tablet (3.125 mg total) by mouth 2 (two) times daily. 180 tablet 3  . fluticasone (FLONASE) 50 MCG/ACT nasal spray Place 2 sprays into both nostrils daily. 16 g 5  . levocetirizine (XYZAL) 5 MG tablet Take 1 tablet (5 mg total) by mouth every evening. 90 tablet 1  . losartan (COZAAR) 25 MG tablet Take 0.5 tablets (12.5 mg total) by mouth daily. 45 tablet 3  . Magnesium Oxide 400 (240 Mg) MG TABS TAKE 1 TABLET BY MOUTH TWICE DAILY. 60 tablet 3  . metoCLOPramide (REGLAN) 5 MG tablet TAKE 1 TABLET BY MOUTH 3 TIMES DAILY BEFORE MEALS. 90 tablet 3  . nitroGLYCERIN (NITROSTAT) 0.4 MG SL tablet Place 1 tablet (0.4 mg total) under the tongue every 5 (five) minutes as needed for chest pain. Max 3 doses. 10 tablet 0  . nystatin (MYCOSTATIN/NYSTOP) powder Apply topically 3 (three) times daily. APPLY TO GROIN AREA 15 g 0  . ondansetron (ZOFRAN) 4 MG tablet Take 4 mg by mouth every 6 (six) hours as needed for nausea or vomiting.    . pantoprazole (PROTONIX) 40 MG tablet Take 1 tablet (40 mg total) by mouth daily. 90 tablet 1  . polyethylene glycol (MIRALAX) packet Take 17 g by mouth daily. 14 each 0  . potassium chloride SA (K-DUR,KLOR-CON) 20 MEQ tablet TAKE 1 TABLET BY MOUTH TWICE DAILY. 60 tablet 3  . simvastatin (ZOCOR) 20 MG  tablet Take 1 tablet (20 mg total) by mouth every morning.    . torsemide (DEMADEX) 20 MG tablet Take 40 mg in the morning and 20 mg in the evening. 180 tablet 3  . Vilazodone HCl (VIIBRYD) 40 MG TABS Take 1 tablet (40 mg total) by mouth daily.    Cody Hale 20 MG TABS tablet TAKE 1 TABLET BY MOUTH ONCE DAILY. 30 tablet 5  . diphenoxylate-atropine (LOMOTIL) 2.5-0.025 MG tablet Take 1 tablet by mouth 4 (four) times daily as needed. Take 1 tablet by mouth 4 times daily as needed for diarrhea or loose stool 30 tablet 0  . guaiFENesin (ROBITUSSIN) 100 MG/5ML SOLN Take 5 mLs by mouth 3 (three) times daily as needed for cough or to loosen phlegm.    . Magnesium Oxide 200 MG TABS Take 200 mg by mouth 2 (two) times daily.    . Multiple Vitamins-Minerals (MULTIVITAMIN) tablet Take 1 tablet by mouth daily.     No facility-administered medications prior to visit.     ROS Review of Systems  Constitutional: Positive for unexpected weight change (wt gain). Negative for diaphoresis and fatigue.  HENT: Negative.   Eyes: Negative for visual disturbance.  Respiratory: Negative for cough, chest tightness, shortness of breath and wheezing.   Cardiovascular: Positive for leg swelling. Negative for chest pain and palpitations.  Gastrointestinal: Negative for abdominal  pain, constipation, diarrhea, nausea and vomiting.  Endocrine: Positive for polydipsia and polyphagia. Negative for polyuria.  Genitourinary: Negative.  Negative for difficulty urinating, dysuria and frequency.  Musculoskeletal: Negative.  Negative for arthralgias and myalgias.  Skin: Negative.  Negative for color change, pallor and rash.  Neurological: Negative.  Negative for dizziness, weakness and light-headedness.  Hematological: Negative for adenopathy. Does not bruise/bleed easily.  Psychiatric/Behavioral: Negative.     Objective:  BP 126/66 (BP Location: Left Arm, Patient Position: Sitting, Cuff Size: Large)   Pulse 73   Temp 98.8 F  (37.1 C) (Oral)   Ht 5\' 9"  (1.753 m)   Wt 256 lb (116.1 kg)   SpO2 93%   BMI 37.80 kg/m   BP Readings from Last 3 Encounters:  07/22/18 126/66  07/22/18 126/66  06/10/18 128/60    Wt Readings from Last 3 Encounters:  07/22/18 256 lb (116.1 kg)  07/22/18 256 lb (116.1 kg)  06/10/18 250 lb (113.4 kg)    Physical Exam Vitals signs reviewed.  Constitutional:      Appearance: He is obese. He is not ill-appearing or diaphoretic.  HENT:     Nose: No congestion or rhinorrhea.     Mouth/Throat:     Mouth: Mucous membranes are moist.     Pharynx: Oropharynx is clear. No oropharyngeal exudate or posterior oropharyngeal erythema.  Eyes:     General: No scleral icterus.    Conjunctiva/sclera: Conjunctivae normal.  Neck:     Musculoskeletal: Normal range of motion and neck supple.  Cardiovascular:     Rate and Rhythm: Normal rate. Rhythm irregularly irregular.  Pulmonary:     Breath sounds: No stridor. No wheezing, rhonchi or rales.  Abdominal:     General: Bowel sounds are normal.     Palpations: There is no hepatomegaly, splenomegaly or mass.     Tenderness: There is no abdominal tenderness. There is no guarding.  Musculoskeletal: Normal range of motion.        General: No swelling.     Right lower leg: 1+ Pitting Edema present.     Left lower leg: 1+ Pitting Edema present.  Skin:    General: Skin is warm and dry.  Neurological:     General: No focal deficit present.     Mental Status: He is oriented to person, place, and time. Mental status is at baseline.     Lab Results  Component Value Date   WBC 5.6 07/22/2018   HGB 12.1 (L) 07/22/2018   HCT 37.1 (L) 07/22/2018   PLT 152.0 07/22/2018   GLUCOSE 209 (H) 07/22/2018   CHOL 90 04/27/2018   TRIG 93.0 04/27/2018   HDL 39.10 04/27/2018   LDLDIRECT 84.2 01/31/2014   LDLCALC 32 04/27/2018   ALT 17 06/09/2018   AST 28 06/09/2018   NA 138 07/22/2018   K 4.5 07/22/2018   CL 96 07/22/2018   CREATININE 1.13 07/22/2018    BUN 17 07/22/2018   CO2 36 (H) 07/22/2018   TSH 1.54 07/22/2018   PSA 0.36 05/30/2014   INR 2.61 02/10/2017   HGBA1C 7.8 (A) 07/22/2018   MICROALBUR 1.7 07/22/2018    No results found.  Assessment & Plan:   Cody Hale was seen today for hypertension, diabetes, atrial fibrillation and anemia.  Diagnoses and all orders for this visit:  Type II diabetes mellitus with manifestations (HCC)- His A1c is up to 7.8% and he has symptoms of glucose toxicity.  I have asked him to start basal insulin and  an SGLT2 inhibitor. -     CBC with Differential/Platelet; Future -     Basic metabolic panel; Future -     Microalbumin / creatinine urine ratio; Future -     Cancel: Hemoglobin A1c; Future -     POCT Glucose (Device for Home Use) -     POCT glycosylated hemoglobin (Hb A1C) -     dapagliflozin propanediol (FARXIGA) 10 MG TABS tablet; Take 10 mg by mouth daily. -     Insulin Degludec (TRESIBA FLEXTOUCH) 200 UNIT/ML SOPN; Inject 20 Units into the skin daily.  Essential hypertension, malignant- His BP is adequately well controlled.  Electrolytes and renal function are normal. -     CBC with Differential/Platelet; Future -     Basic metabolic panel; Future  Chronic atrial fibrillation- He is maintaining rate control with carvedilol.  We will continue anticoagulation with Xarelto. -     TSH; Future  Vitamin B12 deficiency anemia due to intrinsic factor deficiency -     cyanocobalamin ((VITAMIN B-12)) injection 1,000 mcg  Chronic systolic CHF (congestive heart failure) (HCC)- I have asked him to add an SGLT2 inhibitor to reduce the risk of complications of CHF. -     dapagliflozin propanediol (FARXIGA) 10 MG TABS tablet; Take 10 mg by mouth daily.  Other orders -     Insulin Pen Needle (PEN NEEDLES) 30G X 8 MM MISC; Inject 1 pen into the skin daily.   I have discontinued Diago Diveley "David"'s multivitamin, diphenoxylate-atropine, and guaiFENesin. I am also having him start on dapagliflozin  propanediol, Insulin Degludec, and Pen Needles. Additionally, I am having him maintain his fluticasone, nitroGLYCERIN, levocetirizine, XARELTO, polyethylene glycol, pantoprazole, carvedilol, simvastatin, Vilazodone HCl, nystatin, VENELEX, ondansetron, losartan, torsemide, Magnesium Oxide, metoCLOPramide, and potassium chloride SA. We administered cyanocobalamin.  Meds ordered this encounter  Medications  . cyanocobalamin ((VITAMIN B-12)) injection 1,000 mcg  . dapagliflozin propanediol (FARXIGA) 10 MG TABS tablet    Sig: Take 10 mg by mouth daily.    Dispense:  90 tablet    Refill:  1  . Insulin Degludec (TRESIBA FLEXTOUCH) 200 UNIT/ML SOPN    Sig: Inject 20 Units into the skin daily.    Dispense:  9 mL    Refill:  2  . Insulin Pen Needle (PEN NEEDLES) 30G X 8 MM MISC    Sig: Inject 1 pen into the skin daily.    Dispense:  90 each    Refill:  3    90 day supply     Follow-up: Return in about 3 months (around 10/21/2018).  Sanda Linger, MD

## 2018-07-22 NOTE — Patient Instructions (Addendum)
Continue doing brain stimulating activities (puzzles, reading, adult coloring books, staying active) to keep memory sharp.   Continue to eat heart healthy diet (full of fruits, vegetables, whole grains, lean protein, water--limit salt, fat, and sugar intake) and increase physical activity as tolerated.   [name] , Thank you for taking time to come for your Medicare Wellness Visit. I appreciate your ongoing commitment to your health goals. Please review the following plan we discussed and let me know if I can assist you in the future.   These are the goals we discussed: Goals    . Patient Stated     Trying to stay out of the hospital by being active possible and watch my diet by eating less, decrease carbohydrates and sugar. Stay socially active at the independent living facility, active in church, enjoy life, family and worship God.     . Patient Stated     Continue to monitor my diet to keep my diabetes under control. Continue to exercise and gain strength to where I can walk with a cane and then be able to walk by myself.       This is a list of the screening recommended for you and due dates:  Health Maintenance  Topic Date Due  . Eye exam for diabetics  11/27/2018  . Complete foot exam   01/02/2019  . Hemoglobin A1C  01/20/2019  . Colon Cancer Screening  06/15/2019  . Tetanus Vaccine  03/20/2023  . Flu Shot  Completed  .  Hepatitis C: One time screening is recommended by Center for Disease Control  (CDC) for  adults born from [birth year] through 1965.   Completed  . Pneumonia vaccines  Completed     Health Maintenance, Male A healthy lifestyle and preventive care is important for your health and wellness. Ask your health care provider about what schedule of regular examinations is right for you. What should I know about weight and diet? Eat a Healthy Diet  Eat plenty of vegetables, fruits, whole grains, low-fat dairy products, and lean protein.  Do not eat a lot of foods high in  solid fats, added sugars, or salt.  Maintain a Healthy Weight Regular exercise can help you achieve or maintain a healthy weight. You should:  Do at least 150 minutes of exercise each week. The exercise should increase your heart rate and make you sweat (moderate-intensity exercise).  Do strength-training exercises at least twice a week. Watch Your Levels of Cholesterol and Blood Lipids  Have your blood tested for lipids and cholesterol every 5 years starting at 68 years of age. If you are at high risk for heart disease, you should start having your blood tested when you are 68 years old. You may need to have your cholesterol levels checked more often if: ? Your lipid or cholesterol levels are high. ? You are older than 68 years of age. ? You are at high risk for heart disease. What should I know about cancer screening? Many types of cancers can be detected early and may often be prevented. Lung Cancer  You should be screened every year for lung cancer if: ? You are a current smoker who has smoked for at least 30 years. ? You are a former smoker who has quit within the past 15 years.  Talk to your health care provider about your screening options, when you should start screening, and how often you should be screened. Colorectal Cancer  Routine colorectal cancer screening usually begins at  68 years of age and should be repeated every 5-10 years until you are 68 years old. You may need to be screened more often if early forms of precancerous polyps or small growths are found. Your health care provider may recommend screening at an earlier age if you have risk factors for colon cancer.  Your health care provider may recommend using home test kits to check for hidden blood in the stool.  A small camera at the end of a tube can be used to examine your colon (sigmoidoscopy or colonoscopy). This checks for the earliest forms of colorectal cancer. Prostate and Testicular Cancer  Depending on  your age and overall health, your health care provider may do certain tests to screen for prostate and testicular cancer.  Talk to your health care provider about any symptoms or concerns you have about testicular or prostate cancer. Skin Cancer  Check your skin from head to toe regularly.  Tell your health care provider about any new moles or changes in moles, especially if: ? There is a change in a mole's size, shape, or color. ? You have a mole that is larger than a pencil eraser.  Always use sunscreen. Apply sunscreen liberally and repeat throughout the day.  Protect yourself by wearing long sleeves, pants, a wide-brimmed hat, and sunglasses when outside. What should I know about heart disease, diabetes, and high blood pressure?  If you are 78-2 years of age, have your blood pressure checked every 3-5 years. If you are 74 years of age or older, have your blood pressure checked every year. You should have your blood pressure measured twice-once when you are at a hospital or clinic, and once when you are not at a hospital or clinic. Record the average of the two measurements. To check your blood pressure when you are not at a hospital or clinic, you can use: ? An automated blood pressure machine at a pharmacy. ? A home blood pressure monitor.  Talk to your health care provider about your target blood pressure.  If you are between 87-29 years old, ask your health care provider if you should take aspirin to prevent heart disease.  Have regular diabetes screenings by checking your fasting blood sugar level. ? If you are at a normal weight and have a low risk for diabetes, have this test once every three years after the age of 40. ? If you are overweight and have a high risk for diabetes, consider being tested at a younger age or more often.  A one-time screening for abdominal aortic aneurysm (AAA) by ultrasound is recommended for men aged 65-75 years who are current or former  smokers. What should I know about preventing infection? Hepatitis B If you have a higher risk for hepatitis B, you should be screened for this virus. Talk with your health care provider to find out if you are at risk for hepatitis B infection. Hepatitis C Blood testing is recommended for:  Everyone born from [birth year] through 1965.  Anyone with known risk factors for hepatitis C. Sexually Transmitted Diseases (STDs)  You should be screened each year for STDs including gonorrhea and chlamydia if: ? You are sexually active and are younger than 68 years of age. ? You are older than 68 years of age and your health care provider tells you that you are at risk for this type of infection. ? Your sexual activity has changed since you were last screened and you are at an increased risk for  chlamydia or gonorrhea. Ask your health care provider if you are at risk.  Talk with your health care provider about whether you are at high risk of being infected with HIV. Your health care provider may recommend a prescription medicine to help prevent HIV infection. What else can I do?  Schedule regular health, dental, and eye exams.  Stay current with your vaccines (immunizations).  Do not use any tobacco products, such as cigarettes, chewing tobacco, and e-cigarettes. If you need help quitting, ask your health care provider.  Limit alcohol intake to no more than 2 drinks per day. One drink equals 12 ounces of beer, 5 ounces of Laasya Peyton, or 1 ounces of hard liquor.  Do not use street drugs.  Do not share needles.  Ask your health care provider for help if you need support or information about quitting drugs.  Tell your health care provider if you often feel depressed.  Tell your health care provider if you have ever been abused or do not feel safe at home. This information is not intended to replace advice given to you by your health care provider. Make sure you discuss any questions you have with your  health care provider. Document Released: 12/21/2007 Document Revised: 02/21/2016 Document Reviewed: 03/28/2015 Elsevier Interactive Patient Education  2019 [employer].

## 2018-07-22 NOTE — Patient Instructions (Signed)
Type 2 Diabetes Mellitus, Diagnosis, Adult Type 2 diabetes (type 2 diabetes mellitus) is a long-term (chronic) disease. In type 2 diabetes, one or both of these problems may be present:  The pancreas does not make enough of a hormone called insulin.  Cells in the body do not respond properly to insulin that the body makes (insulin resistance). Normally, insulin allows blood sugar (glucose) to enter cells in the body. The cells use glucose for energy. Insulin resistance or lack of insulin causes excess glucose to build up in the blood instead of going into cells. As a result, high blood glucose (hyperglycemia) develops. What increases the risk? The following factors may make you more likely to develop type 2 diabetes:  Having a family member with type 2 diabetes.  Being overweight or obese.  Having an inactive (sedentary) lifestyle.  Having been diagnosed with insulin resistance.  Having a history of prediabetes, gestational diabetes, or polycystic ovary syndrome (PCOS).  Being of American-Indian, African-American, Hispanic/Latino, or Asian/Pacific Islander descent. What are the signs or symptoms? In the early stage of this condition, you may not have symptoms. Symptoms develop slowly and may include:  Increased thirst (polydipsia).  Increased hunger(polyphagia).  Increased urination (polyuria).  Increased urination during the night (nocturia).  Unexplained weight loss.  Frequent infections that keep coming back (recurring).  Fatigue.  Weakness.  Vision changes, such as blurry vision.  Cuts or bruises that are slow to heal.  Tingling or numbness in the hands or feet.  Dark patches on the skin (acanthosis nigricans). How is this diagnosed? This condition is diagnosed based on your symptoms, your medical history, a physical exam, and your blood glucose level. Your blood glucose may be checked with one or more of the following blood tests:  A fasting blood glucose (FBG)  test. You will not be allowed to eat (you will fast) for 8 hours or longer before a blood sample is taken.  A random blood glucose test. This test checks blood glucose at any time of day regardless of when you ate.  An A1c (hemoglobin A1c) blood test. This test provides information about blood glucose control over the previous 2-3 months.  An oral glucose tolerance test (OGTT). This test measures your blood glucose at two times: ? After fasting. This is your baseline blood glucose level. ? Two hours after drinking a beverage that contains glucose. You may be diagnosed with type 2 diabetes if:  Your FBG level is 126 mg/dL (7.0 mmol/L) or higher.  Your random blood glucose level is 200 mg/dL (11.1 mmol/L) or higher.  Your A1c level is 6.5% or higher.  Your OGTT result is higher than 200 mg/dL (11.1 mmol/L). These blood tests may be repeated to confirm your diagnosis. How is this treated? Your treatment may be managed by a specialist called an endocrinologist. Type 2 diabetes may be treated by following instructions from your health care provider about:  Making diet and lifestyle changes. This may include: ? Following an individualized nutrition plan that is developed by a diet and nutrition specialist (registered dietitian). ? Exercising regularly. ? Finding ways to manage stress.  Checking your blood glucose level as often as told.  Taking diabetes medicines or insulin daily. This helps to keep your blood glucose levels in the healthy range. ? If you use insulin, you may need to adjust the dosage depending on how physically active you are and what foods you eat. Your health care provider will tell you how to adjust your dosage.    Taking medicines to help prevent complications from diabetes, such as: ? Aspirin. ? Medicine to lower cholesterol. ? Medicine to control blood pressure. Your health care provider will set individualized treatment goals for you. Your goals will be based on  your age, other medical conditions you have, and how you respond to diabetes treatment. Generally, the goal of treatment is to maintain the following blood glucose levels:  Before meals (preprandial): 80-130 mg/dL (4.4-7.2 mmol/L).  After meals (postprandial): below 180 mg/dL (10 mmol/L).  A1c level: less than 7%. Follow these instructions at home: Questions to ask your health care provider  Consider asking the following questions: ? Do I need to meet with a diabetes educator? ? Where can I find a support group for people with diabetes? ? What equipment will I need to manage my diabetes at home? ? What diabetes medicines do I need, and when should I take them? ? How often do I need to check my blood glucose? ? What number can I call if I have questions? ? When is my next appointment? General instructions  Take over-the-counter and prescription medicines only as told by your health care provider.  Keep all follow-up visits as told by your health care provider. This is important.  For more information about diabetes, visit: ? American Diabetes Association (ADA): www.diabetes.org ? American Association of Diabetes Educators (AADE): www.diabeteseducator.org Contact a health care provider if:  Your blood glucose is at or above 240 mg/dL (13.3 mmol/L) for 2 days in a row.  You have been sick or have had a fever for 2 days or longer, and you are not getting better.  You have any of the following problems for more than 6 hours: ? You cannot eat or drink. ? You have nausea and vomiting. ? You have diarrhea. Get help right away if:  Your blood glucose is lower than 54 mg/dL (3.0 mmol/L).  You become confused or you have trouble thinking clearly.  You have difficulty breathing.  You have moderate or large ketone levels in your urine. Summary  Type 2 diabetes (type 2 diabetes mellitus) is a long-term (chronic) disease. In type 2 diabetes, the pancreas does not make enough of a  hormone called insulin, or cells in the body do not respond properly to insulin that the body makes (insulin resistance).  This condition is treated by making diet and lifestyle changes and taking diabetes medicines or insulin.  Your health care provider will set individualized treatment goals for you. Your goals will be based on your age, other medical conditions you have, and how you respond to diabetes treatment.  Keep all follow-up visits as told by your health care provider. This is important. This information is not intended to replace advice given to you by your health care provider. Make sure you discuss any questions you have with your health care provider. Document Released: 06/24/2005 Document Revised: 01/23/2017 Document Reviewed: 07/28/2015 Elsevier Interactive Patient Education  2019 [employer]  

## 2018-07-23 DIAGNOSIS — R2689 Other abnormalities of gait and mobility: Secondary | ICD-10-CM | POA: Diagnosis not present

## 2018-07-23 DIAGNOSIS — I5022 Chronic systolic (congestive) heart failure: Secondary | ICD-10-CM | POA: Diagnosis not present

## 2018-07-23 DIAGNOSIS — E1143 Type 2 diabetes mellitus with diabetic autonomic (poly)neuropathy: Secondary | ICD-10-CM | POA: Diagnosis not present

## 2018-07-23 DIAGNOSIS — I110 Hypertensive heart disease with heart failure: Secondary | ICD-10-CM | POA: Diagnosis not present

## 2018-07-23 DIAGNOSIS — J9611 Chronic respiratory failure with hypoxia: Secondary | ICD-10-CM | POA: Diagnosis not present

## 2018-07-23 DIAGNOSIS — M150 Primary generalized (osteo)arthritis: Secondary | ICD-10-CM | POA: Diagnosis not present

## 2018-07-24 LAB — URINALYSIS, ROUTINE W REFLEX MICROSCOPIC
Bacteria, UA: NONE SEEN /HPF
Bilirubin Urine: NEGATIVE
Glucose, UA: NEGATIVE
Hgb urine dipstick: NEGATIVE
Hyaline Cast: NONE SEEN /LPF
Ketones, ur: NEGATIVE
Nitrite: NEGATIVE
Protein, ur: NEGATIVE
RBC / HPF: NONE SEEN /HPF (ref 0–2)
Specific Gravity, Urine: 1.012 (ref 1.001–1.03)
Squamous Epithelial / HPF: NONE SEEN /HPF (ref <=5)
pH: 7.5 (ref 5.0–8.0)

## 2018-07-24 LAB — CULTURE, URINE COMPREHENSIVE
MICRO NUMBER:: 59156
SPECIMEN QUALITY:: ADEQUATE

## 2018-07-27 DIAGNOSIS — J9611 Chronic respiratory failure with hypoxia: Secondary | ICD-10-CM | POA: Diagnosis not present

## 2018-07-27 DIAGNOSIS — I5022 Chronic systolic (congestive) heart failure: Secondary | ICD-10-CM | POA: Diagnosis not present

## 2018-07-27 DIAGNOSIS — I110 Hypertensive heart disease with heart failure: Secondary | ICD-10-CM | POA: Diagnosis not present

## 2018-07-27 DIAGNOSIS — R2689 Other abnormalities of gait and mobility: Secondary | ICD-10-CM | POA: Diagnosis not present

## 2018-07-27 DIAGNOSIS — M150 Primary generalized (osteo)arthritis: Secondary | ICD-10-CM | POA: Diagnosis not present

## 2018-07-27 DIAGNOSIS — E1143 Type 2 diabetes mellitus with diabetic autonomic (poly)neuropathy: Secondary | ICD-10-CM | POA: Diagnosis not present

## 2018-07-27 IMAGING — CR DG KNEE 1-2V*L*
1 series · 2 of 2 positions shown · non-contrast
Comparison: None.

CLINICAL DATA: Left knee pain and calor.

EXAM:
LEFT KNEE - 1-2 VIEW

[Series 1: ap · 0.17mm/px · 2 of 2 slices shown]
[im 1/2]
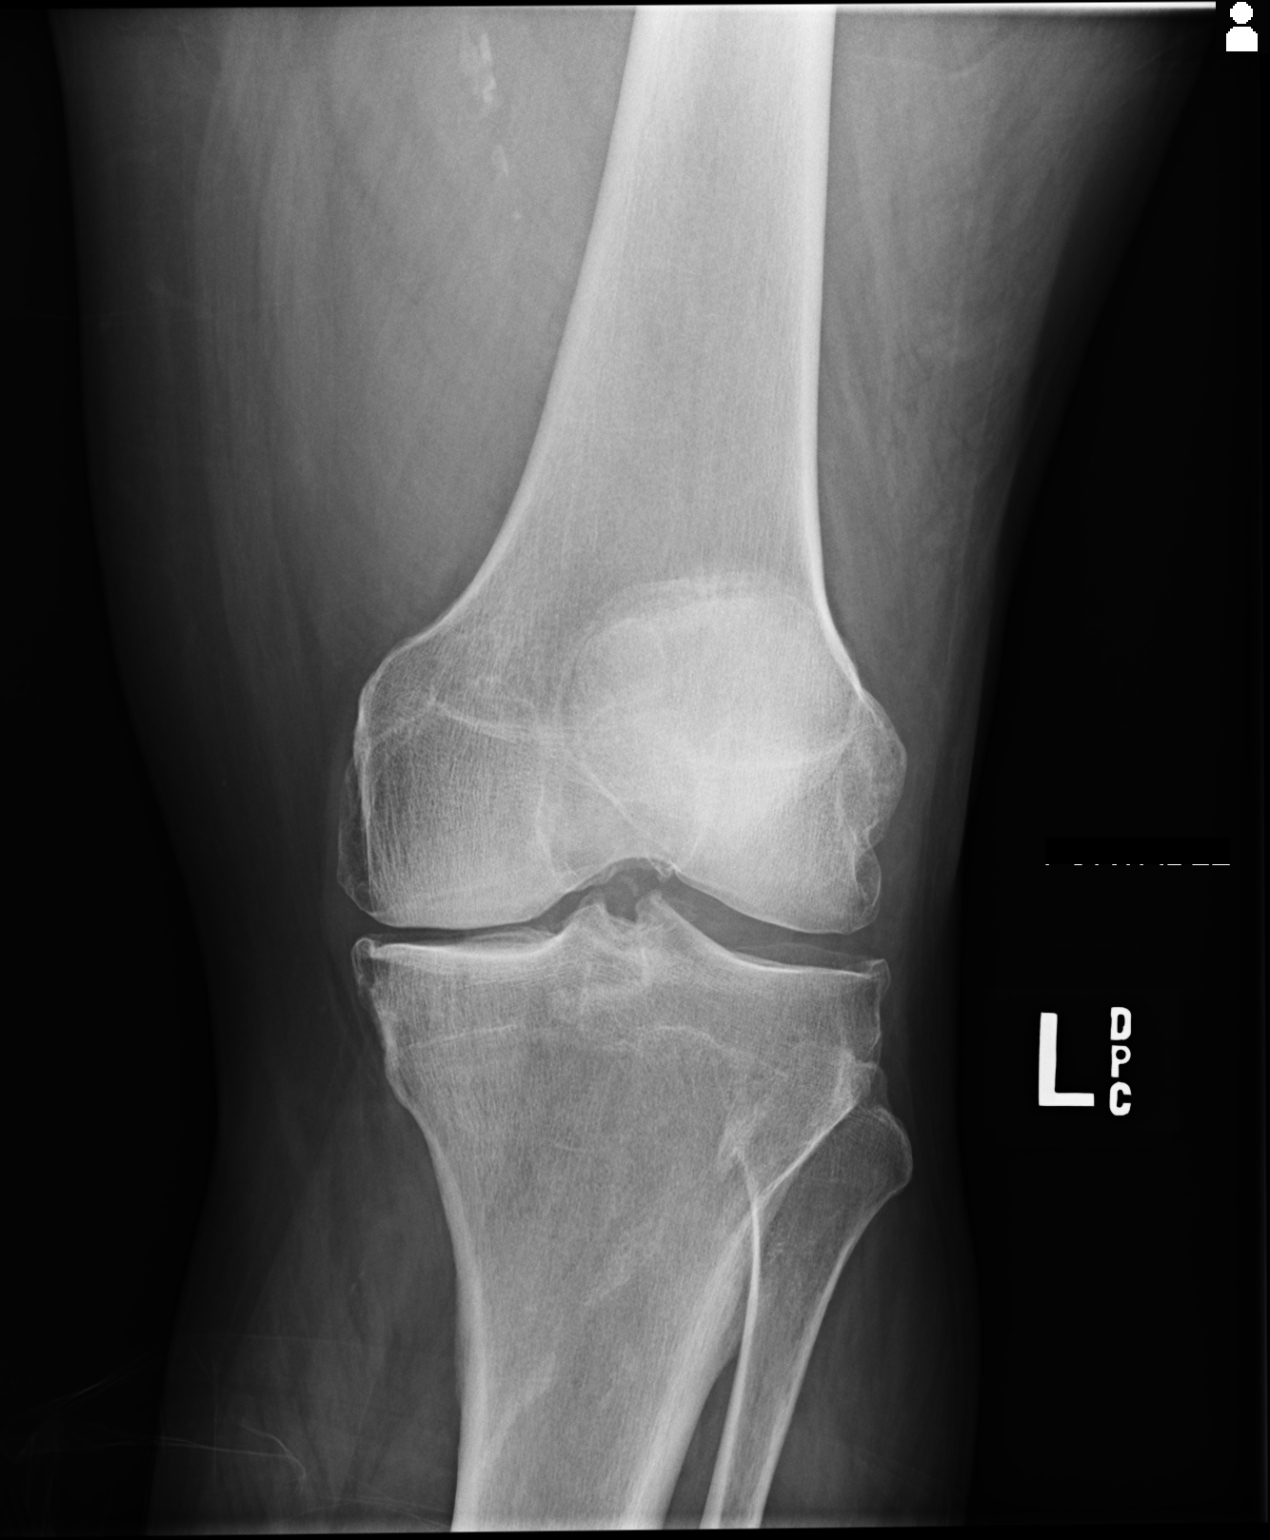
[im 2/2]
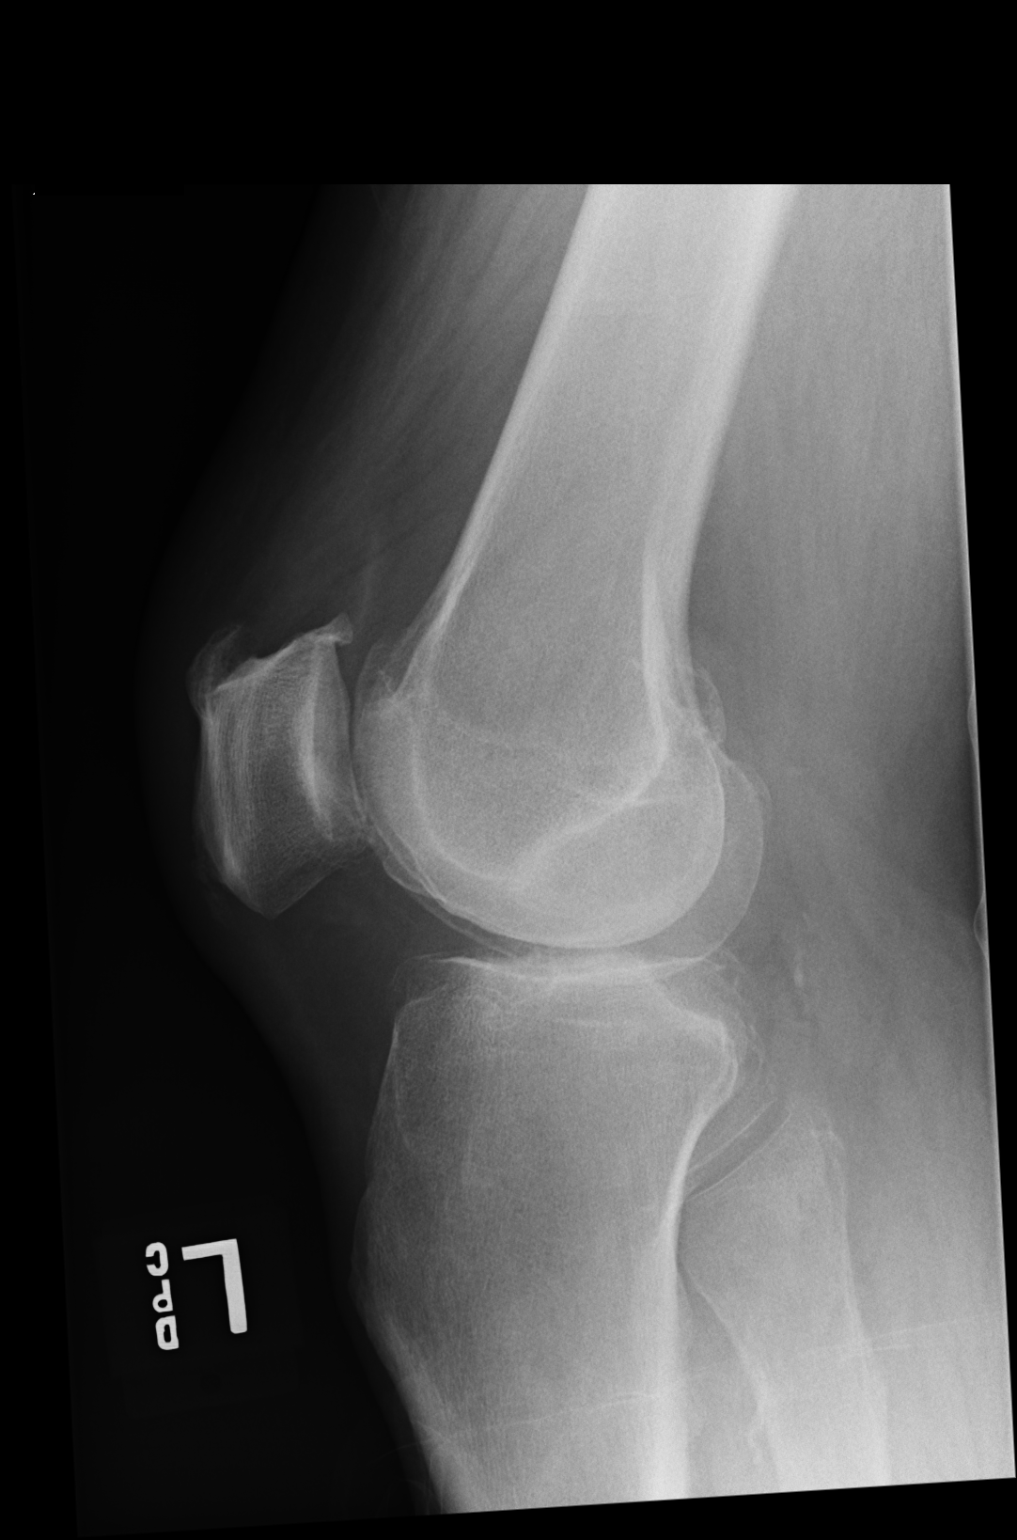

[2 of 2 positions shown; findings below may reference images not displayed]

FINDINGS: Patellofemoral and medial femorotibial joint space narrowing and
spurring consistent with osteoarthritis. No significant joint
effusion. No fracture, dislocation, intra-articular loose body nor
bone destruction. No significant soft tissue findings.
Atherosclerosis of the femoral artery.
IMPRESSION: 1. Osteoarthritis of the patellofemoral and medial femorotibial
compartment.
2. No acute fracture nor bone destruction.
3. No joint effusion.

## 2018-07-29 ENCOUNTER — Ambulatory Visit: Payer: Self-pay | Admitting: Student

## 2018-07-29 DIAGNOSIS — E1143 Type 2 diabetes mellitus with diabetic autonomic (poly)neuropathy: Secondary | ICD-10-CM | POA: Diagnosis not present

## 2018-07-29 DIAGNOSIS — I5022 Chronic systolic (congestive) heart failure: Secondary | ICD-10-CM | POA: Diagnosis not present

## 2018-07-29 DIAGNOSIS — R2689 Other abnormalities of gait and mobility: Secondary | ICD-10-CM | POA: Diagnosis not present

## 2018-07-29 DIAGNOSIS — J9611 Chronic respiratory failure with hypoxia: Secondary | ICD-10-CM | POA: Diagnosis not present

## 2018-07-29 DIAGNOSIS — M150 Primary generalized (osteo)arthritis: Secondary | ICD-10-CM | POA: Diagnosis not present

## 2018-07-29 DIAGNOSIS — I110 Hypertensive heart disease with heart failure: Secondary | ICD-10-CM | POA: Diagnosis not present

## 2018-08-03 DIAGNOSIS — E1143 Type 2 diabetes mellitus with diabetic autonomic (poly)neuropathy: Secondary | ICD-10-CM | POA: Diagnosis not present

## 2018-08-03 DIAGNOSIS — R2689 Other abnormalities of gait and mobility: Secondary | ICD-10-CM | POA: Diagnosis not present

## 2018-08-03 DIAGNOSIS — I110 Hypertensive heart disease with heart failure: Secondary | ICD-10-CM | POA: Diagnosis not present

## 2018-08-03 DIAGNOSIS — J9611 Chronic respiratory failure with hypoxia: Secondary | ICD-10-CM | POA: Diagnosis not present

## 2018-08-03 DIAGNOSIS — I5022 Chronic systolic (congestive) heart failure: Secondary | ICD-10-CM | POA: Diagnosis not present

## 2018-08-03 DIAGNOSIS — M150 Primary generalized (osteo)arthritis: Secondary | ICD-10-CM | POA: Diagnosis not present

## 2018-08-04 ENCOUNTER — Telehealth: Payer: Self-pay | Admitting: Internal Medicine

## 2018-08-04 DIAGNOSIS — E1143 Type 2 diabetes mellitus with diabetic autonomic (poly)neuropathy: Secondary | ICD-10-CM | POA: Diagnosis not present

## 2018-08-04 DIAGNOSIS — J9611 Chronic respiratory failure with hypoxia: Secondary | ICD-10-CM | POA: Diagnosis not present

## 2018-08-04 DIAGNOSIS — R2689 Other abnormalities of gait and mobility: Secondary | ICD-10-CM | POA: Diagnosis not present

## 2018-08-04 DIAGNOSIS — M150 Primary generalized (osteo)arthritis: Secondary | ICD-10-CM | POA: Diagnosis not present

## 2018-08-04 DIAGNOSIS — I110 Hypertensive heart disease with heart failure: Secondary | ICD-10-CM | POA: Diagnosis not present

## 2018-08-04 DIAGNOSIS — I5022 Chronic systolic (congestive) heart failure: Secondary | ICD-10-CM | POA: Diagnosis not present

## 2018-08-04 NOTE — Telephone Encounter (Signed)
Spoke to [place] and confirmed that a letter was needed.  Appt made for pt for 08/05/2018 at 1pm.  Letter completed and faxed back.

## 2018-08-04 NOTE — Telephone Encounter (Signed)
Copied from CRM [id number]. Topic: General - Other >> Aug 04, 2018  1:21 PM Elliot Gault wrote: Jethro Bolus Name: Marisue Ivan  Relation to pt: RN from [facility]  Call back number: [phone]   Reason for call: Nurse assessed patient and states an appointment is needed due to patient having possible pneumonia, as per  nurse lungs are full of mucus and coughing phlegm.   Patient currently resides at [facility], facility stated a letter from the PCP is needed stating an appointment needs to be made due to the symptoms mentioned above. Please fax letter to nursing home Attention Lupita Leash [phone]

## 2018-08-05 ENCOUNTER — Encounter: Payer: Self-pay | Admitting: Internal Medicine

## 2018-08-05 ENCOUNTER — Ambulatory Visit (INDEPENDENT_AMBULATORY_CARE_PROVIDER_SITE_OTHER)
Admission: RE | Admit: 2018-08-05 | Discharge: 2018-08-05 | Disposition: A | Discharge status: Home / Self Care | Payer: Medicare Other | Source: Ambulatory Visit | Attending: Internal Medicine | Admitting: Internal Medicine

## 2018-08-05 ENCOUNTER — Telehealth: Payer: Self-pay | Admitting: Internal Medicine

## 2018-08-05 ENCOUNTER — Ambulatory Visit: Payer: Self-pay | Admitting: Internal Medicine

## 2018-08-05 ENCOUNTER — Ambulatory Visit (INDEPENDENT_AMBULATORY_CARE_PROVIDER_SITE_OTHER): Payer: Medicare Other | Admitting: Internal Medicine

## 2018-08-05 VITALS — BP 110/68 | Temp 98.3°F | Ht 69.0 in | Wt 242.0 lb

## 2018-08-05 DIAGNOSIS — R05 Cough: Secondary | ICD-10-CM

## 2018-08-05 DIAGNOSIS — R059 Cough, unspecified: Secondary | ICD-10-CM

## 2018-08-05 DIAGNOSIS — J988 Other specified respiratory disorders: Secondary | ICD-10-CM

## 2018-08-05 DIAGNOSIS — J449 Chronic obstructive pulmonary disease, unspecified: Secondary | ICD-10-CM

## 2018-08-05 DIAGNOSIS — R0602 Shortness of breath: Secondary | ICD-10-CM | POA: Diagnosis not present

## 2018-08-05 LAB — POCT EXHALED NITRIC OXIDE: FeNO level (ppb): 14

## 2018-08-05 MED ORDER — GLYCOPYRROLATE 25 MCG/ML IN SOLN: 1.0000 | Freq: Two times a day (BID) | RESPIRATORY_TRACT | 11 refills | Status: DC

## 2018-08-05 MED ORDER — CEFDINIR 300 MG PO CAPS: 300.0000 mg | ORAL_CAPSULE | Freq: Two times a day (BID) | ORAL | 0 refills | Status: DC
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
## 2018-08-05 NOTE — Progress Notes (Signed)
Subjective:  Patient ID: Cody Hale, male    DOB: Sep 10, 1950  Age: 68 y.o. MRN: [id number]  CC: Cough   HPI Cody Hale presents for a 5-day history of sinus drainage, chills, cough productive of yellow phlegm, shortness of breath, wheezing.  He quit smoking many years ago.  Outpatient Medications Prior to Visit  Medication Sig Dispense Refill  . Balsam [place]-Castor Oil (VENELEX) [place] Apply residents buttocks every shift    . dapagliflozin propanediol (FARXIGA) 10 MG TABS tablet Take 10 mg by mouth daily. 90 tablet 1  . fluticasone (FLONASE) 50 MCG/ACT nasal spray Place 2 sprays into both nostrils daily. 16 g 5  . Insulin Degludec (TRESIBA FLEXTOUCH) 200 UNIT/ML SOPN Inject 20 Units into the skin daily. 9 mL 2  . Insulin Pen Needle (PEN NEEDLES) 30G X 8 MM MISC Inject 1 pen into the skin daily. 90 each 3  . levocetirizine (XYZAL) 5 MG tablet Take 1 tablet (5 mg total) by mouth every evening. 90 tablet 1  . losartan (COZAAR) 25 MG tablet Take 0.5 tablets (12.5 mg total) by mouth daily. 45 tablet 3  . Magnesium Oxide 400 (240 Mg) MG TABS TAKE 1 TABLET BY MOUTH TWICE DAILY. 60 tablet 3  . metoCLOPramide (REGLAN) 5 MG tablet TAKE 1 TABLET BY MOUTH 3 TIMES DAILY BEFORE MEALS. 90 tablet 3  . nitroGLYCERIN (NITROSTAT) 0.4 MG SL tablet Place 1 tablet (0.4 mg total) under the tongue every 5 (five) minutes as needed for chest pain. Max 3 doses. 10 tablet 0  . nystatin (MYCOSTATIN/NYSTOP) powder Apply topically 3 (three) times daily. APPLY TO GROIN AREA 15 g 0  . ondansetron (ZOFRAN) 4 MG tablet Take 4 mg by mouth every 6 (six) hours as needed for nausea or vomiting.    . pantoprazole (PROTONIX) 40 MG tablet Take 1 tablet (40 mg total) by mouth daily. 90 tablet 1  . polyethylene glycol (MIRALAX) packet Take 17 g by mouth daily. 14 each 0  . potassium chloride SA (K-DUR,KLOR-CON) 20 MEQ tablet TAKE 1 TABLET BY MOUTH TWICE DAILY. 60 tablet 3  . simvastatin (ZOCOR) 20 MG tablet Take 1 tablet (20 mg  total) by mouth every morning.    . torsemide (DEMADEX) 20 MG tablet Take 40 mg in the morning and 20 mg in the evening. 180 tablet 3  . Vilazodone HCl (VIIBRYD) 40 MG TABS Take 1 tablet (40 mg total) by mouth daily.    Alveda Reasons 20 MG TABS tablet TAKE 1 TABLET BY MOUTH ONCE DAILY. 30 tablet 5  . carvedilol (COREG) 3.125 MG tablet Take 1 tablet (3.125 mg total) by mouth 2 (two) times daily. 180 tablet 3   No facility-administered medications prior to visit.     ROS Review of Systems  Constitutional: Positive for chills. Negative for fatigue and fever.  HENT: Positive for postnasal drip, rhinorrhea, sinus pressure and sinus pain. Negative for congestion, facial swelling and sore throat.   Respiratory: Positive for cough. Negative for chest tightness and wheezing.   Cardiovascular: Negative for chest pain, palpitations and leg swelling.  Gastrointestinal: Negative for abdominal pain, diarrhea and vomiting.  Endocrine: Negative.   Genitourinary: Negative.  Negative for difficulty urinating.  Musculoskeletal: Negative.  Negative for arthralgias and myalgias.  Skin: Negative.  Negative for color change and rash.  Neurological: Negative.  Negative for dizziness, weakness and light-headedness.  Hematological: Negative for adenopathy. Does not bruise/bleed easily.  Psychiatric/Behavioral: Negative.     Objective:  BP 110/68 (BP Location:  Left Arm, Patient Position: Sitting, Cuff Size: Normal)   Temp 98.3 F (36.8 C) (Oral)   Ht '5\' 9"'$  (1.753 m)   Wt 242 lb (109.8 kg)   SpO2 98%   BMI 35.74 kg/m   BP Readings from Last 3 Encounters:  08/05/18 110/68  07/22/18 126/66  07/22/18 126/66    Wt Readings from Last 3 Encounters:  08/05/18 242 lb (109.8 kg)  07/22/18 256 lb (116.1 kg)  07/22/18 256 lb (116.1 kg)    Physical Exam Vitals signs reviewed.  Constitutional:      General: He is not in acute distress.    Appearance: He is obese. He is not ill-appearing, toxic-appearing or  diaphoretic.  HENT:     Nose: Nose normal. No congestion or rhinorrhea.     Mouth/Throat:     Pharynx: Oropharynx is clear. No oropharyngeal exudate or posterior oropharyngeal erythema.  Eyes:     Conjunctiva/sclera: Conjunctivae normal.  Neck:     Musculoskeletal: Normal range of motion and neck supple. No muscular tenderness.  Cardiovascular:     Rate and Rhythm: Normal rate and regular rhythm.     Heart sounds: No murmur. No gallop.   Pulmonary:     Effort: Pulmonary effort is normal. No tachypnea or accessory muscle usage.     Breath sounds: No stridor. Examination of the right-middle field reveals rhonchi. Examination of the left-middle field reveals rhonchi. Examination of the right-lower field reveals rhonchi. Examination of the left-lower field reveals rhonchi. Rhonchi present. No decreased breath sounds, wheezing or rales.  Abdominal:     General: Bowel sounds are normal.     Palpations: There is no mass.     Tenderness: There is no abdominal tenderness. There is no guarding.  Musculoskeletal: Normal range of motion.        General: No swelling.     Right lower leg: No edema.     Left lower leg: No edema.  Skin:    General: Skin is warm and dry.     Coloration: Skin is not pale.     Findings: No erythema or rash.  Neurological:     General: No focal deficit present.     Mental Status: He is oriented to person, place, and time. Mental status is at baseline.     Lab Results  Component Value Date   WBC 5.6 07/22/2018   HGB 12.1 (L) 07/22/2018   HCT 37.1 (L) 07/22/2018   PLT 152.0 07/22/2018   GLUCOSE 209 (H) 07/22/2018   CHOL 90 04/27/2018   TRIG 93.0 04/27/2018   HDL 39.10 04/27/2018   LDLDIRECT 84.2 01/31/2014   LDLCALC 32 04/27/2018   ALT 17 06/09/2018   AST 28 06/09/2018   NA 138 07/22/2018   K 4.5 07/22/2018   CL 96 07/22/2018   CREATININE 1.13 07/22/2018   BUN 17 07/22/2018   CO2 36 (H) 07/22/2018   TSH 1.54 07/22/2018   PSA 0.36 05/30/2014   INR  2.61 02/10/2017   HGBA1C 7.8 (A) 07/22/2018   MICROALBUR 1.7 07/22/2018    Dg Chest 2 View  Result Date: 08/05/2018 CLINICAL DATA:  Cough, congestion and shortness of breath for 5 days. EXAM: CHEST - 2 VIEW COMPARISON:  Chest x-ray 05/21/2018 and chest CT 05/19/2018 FINDINGS: The heart is mildly enlarged but stable. Stable mild tortuosity and calcification of the thoracic aorta. 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 No acute pulmonary findings. No pleural effusion. No worrisome pulmonary lesions. The bony thorax is intact.  IMPRESSION: No acute cardiopulmonary findings.  Mild stable cardiac enlargement. Electronically Signed   By: Marijo Sanes M.D.   On: 08/05/2018 14:17    Assessment & Plan:   Xiomar was seen today for cough.  Diagnoses and all orders for this visit:  Cough- His FeNO score is not elevated so I do not think he would benefit from a systemic or inhaled steroid. -     DG Chest 2 View; Future -     POCT EXHALED NITRIC OXIDE  COPD (chronic obstructive pulmonary disease) with chronic bronchitis ([place])- I have asked him to start using a LAMA inhaler for this. -     Glycopyrrolate (LONHALA MAGNAIR REFILL KIT) 25 MCG/ML SOLN; Inhale 1 puff into the lungs 2 (two) times daily. -     Glycopyrrolate (LONHALA MAGNAIR STARTER KIT) 25 MCG/ML SOLN; Inhale 1 puff into the lungs 2 (two) times daily.  RTI (respiratory tract infection)- I will treat the infection with cefdinir. -     cefdinir (OMNICEF) 300 MG capsule; Take 1 capsule (300 mg total) by mouth 2 (two) times daily for 10 days.   I am having Cody Hale "Cody Hale" start on Glycopyrrolate, Glycopyrrolate, and cefdinir. I am also having him maintain his fluticasone, nitroGLYCERIN, levocetirizine, XARELTO, polyethylene glycol, pantoprazole, carvedilol, simvastatin, Vilazodone HCl, nystatin, VENELEX, ondansetron, losartan, torsemide, Magnesium Oxide, metoCLOPramide, potassium chloride SA, dapagliflozin propanediol, Insulin  Degludec, and Pen Needles.  Meds ordered this encounter  Medications  . Glycopyrrolate (LONHALA MAGNAIR REFILL KIT) 25 MCG/ML SOLN    Sig: Inhale 1 puff into the lungs 2 (two) times daily.    Dispense:  60 mL    Refill:  11  . Glycopyrrolate (LONHALA MAGNAIR STARTER KIT) 25 MCG/ML SOLN    Sig: Inhale 1 puff into the lungs 2 (two) times daily.    Dispense:  60 mL    Refill:  11  . cefdinir (OMNICEF) 300 MG capsule    Sig: Take 1 capsule (300 mg total) by mouth 2 (two) times daily for 10 days.    Dispense:  20 capsule    Refill:  0     Follow-up: Return in about 3 weeks (around 08/26/2018).  Scarlette Calico, MD

## 2018-08-05 NOTE — Telephone Encounter (Signed)
Copied from CRM [id number]. Topic: Quick Communication - See Telephone Encounter >> Aug 05, 2018  4:08 PM Lorrine Kin, NT wrote: CRM for notification. See Telephone encounter for: 08/05/18. Tobi Bastos with [facility] calling and states that they received paperwork form the community that the patient resides in and was advised that the patient would need antibiotics, which pharmacy did receive, and nebulizer therapy. States they did not receive a prescription for a nebulizer. Would need prescription for the machine and the medication. Please advise.

## 2018-08-05 NOTE — Patient Instructions (Signed)
Upper Respiratory Infection, Adult  An upper respiratory infection (URI) affects the nose, throat, and upper air passages. URIs are caused by germs (viruses). The most common type of URI is often called "the common cold."  Medicines cannot cure URIs, but you can do things at home to relieve your symptoms. URIs usually get better within 7-10 days.  Follow these instructions at home:  Activity  · Rest as needed.  · If you have a fever, stay home from work or school until your fever is gone, or until your doctor says you may return to work or school.  ? You should stay home until you cannot spread the infection anymore (you are not contagious).  ? Your doctor may have you wear a face mask so you have less risk of spreading the infection.  Relieving symptoms  · Gargle with a salt-water mixture 3-4 times a day or as needed. To make a salt-water mixture, completely dissolve ½-1 tsp of salt in 1 cup of warm water.  · Use a cool-mist humidifier to add moisture to the air. This can help you breathe more easily.  Eating and drinking    · Drink enough fluid to keep your pee (urine) pale yellow.  · Eat soups and other clear broths.  General instructions    · Take over-the-counter and prescription medicines only as told by your doctor. These include cold medicines, fever reducers, and cough suppressants.  · Do not use any products that contain nicotine or tobacco. These include cigarettes and e-cigarettes. If you need help quitting, ask your doctor.  · Avoid being where people are smoking (avoid secondhand smoke).  · Make sure you get regular shots and get the flu shot every year.  · Keep all follow-up visits as told by your doctor. This is important.  How to avoid spreading infection to others    · Wash your hands often with soap and water. If you do not have soap and water, use hand sanitizer.  · Avoid touching your mouth, face, eyes, or nose.  · Cough or sneeze into a tissue or your sleeve or elbow. Do not cough or sneeze  into your hand or into the air.  Contact a doctor if:  · You are getting worse, not better.  · You have any of these:  ? A fever.  ? Chills.  ? Brown or red mucus in your nose.  ? Yellow or brown fluid (discharge)coming from your nose.  ? Pain in your face, especially when you bend forward.  ? Swollen neck glands.  ? Pain with swallowing.  ? White areas in the back of your throat.  Get help right away if:  · You have shortness of breath that gets worse.  · You have very bad or constant:  ? Headache.  ? Ear pain.  ? Pain in your forehead, behind your eyes, and over your cheekbones (sinus pain).  ? Chest pain.  · You have long-lasting (chronic) lung disease along with any of these:  ? Wheezing.  ? Long-lasting cough.  ? Coughing up blood.  ? A change in your usual mucus.  · You have a stiff neck.  · You have changes in your:  ? Vision.  ? Hearing.  ? Thinking.  ? Mood.  Summary  · An upper respiratory infection (URI) is caused by a germ called a virus. The most common type of URI is often called "the common cold."  · URIs usually get better within 7-10   days.  · Take over-the-counter and prescription medicines only as told by your doctor.  This information is not intended to replace advice given to you by your health care provider. Make sure you discuss any questions you have with your health care provider.  Document Released: 12/11/2007 Document Revised: 02/14/2017 Document Reviewed: 02/14/2017  Elsevier Interactive Patient Education © 2019 [employer]

## 2018-08-06 ENCOUNTER — Telehealth: Payer: Self-pay | Admitting: Internal Medicine

## 2018-08-06 ENCOUNTER — Other Ambulatory Visit: Payer: Self-pay | Admitting: Internal Medicine

## 2018-08-06 DIAGNOSIS — J9611 Chronic respiratory failure with hypoxia: Secondary | ICD-10-CM | POA: Diagnosis not present

## 2018-08-06 DIAGNOSIS — R2689 Other abnormalities of gait and mobility: Secondary | ICD-10-CM | POA: Diagnosis not present

## 2018-08-06 DIAGNOSIS — I110 Hypertensive heart disease with heart failure: Secondary | ICD-10-CM | POA: Diagnosis not present

## 2018-08-06 DIAGNOSIS — E1143 Type 2 diabetes mellitus with diabetic autonomic (poly)neuropathy: Secondary | ICD-10-CM | POA: Diagnosis not present

## 2018-08-06 DIAGNOSIS — J449 Chronic obstructive pulmonary disease, unspecified: Secondary | ICD-10-CM

## 2018-08-06 DIAGNOSIS — M150 Primary generalized (osteo)arthritis: Secondary | ICD-10-CM | POA: Diagnosis not present

## 2018-08-06 DIAGNOSIS — I5022 Chronic systolic (congestive) heart failure: Secondary | ICD-10-CM | POA: Diagnosis not present

## 2018-08-06 DIAGNOSIS — J988 Other specified respiratory disorders: Secondary | ICD-10-CM

## 2018-08-06 MED ORDER — GLYCOPYRROLATE 25 MCG/ML IN SOLN: 1.0000 | Freq: Two times a day (BID) | RESPIRATORY_TRACT | 11 refills | Status: DC

## 2018-08-06 MED ORDER — CEFDINIR 300 MG PO CAPS: 300.0000 mg | ORAL_CAPSULE | Freq: Two times a day (BID) | ORAL | 0 refills | Status: AC

## 2018-08-06 NOTE — Telephone Encounter (Signed)
Routing to dr jones, please advise, thanks 

## 2018-08-06 NOTE — Telephone Encounter (Signed)
Spoke to [employer] and informed that I am aware there is a PA and I will take care of that and the specialty pharmacy

## 2018-08-06 NOTE — Telephone Encounter (Signed)
Marisue Ivan with [facility] called again to add to her previous message.  She stated that the patient also needs nebulizer treatment and the nebulizer machine as well.  Please advise and call if there are any questions.  CB# [phone]

## 2018-08-06 NOTE — Telephone Encounter (Signed)
Pharmacy called in. They are stating that they did not receive Rx for antibiotic for pt. They are also requesting to have a Rx for the nebulizer machine.    Pharmacy: [facility] - [place], [place] - [address]

## 2018-08-06 NOTE — Telephone Encounter (Signed)
Please disregard this message °

## 2018-08-06 NOTE — Telephone Encounter (Signed)
Copied from [place] [id number]. Topic: Quick Communication - Rx Refill/Question >> Aug 06, 2018  4:02 PM [place], [place] D wrote: Medication: Glycopyrrolate [facility] MAGNAIR REFILL KIT) 25 MCG/ML SOLN / Glycopyrrolate (LONHALA MAGNAIR STARTER KIT) 25 MCG/ML SOLN / Anderson Malta with [facility] stated that insurance needs PA for rx. They would like to know if Dr. Ronnald Ramp wants to send a PA or change to a different medication. If it is changed, they will need a new rx, one for the medication and one for the machine. Please advise. KL#[phone]  [phone]  Has the patient contacted their pharmacy? Yes.   (Agent: If no, request that the patient contact the pharmacy for the refill.) (Agent: If yes, when and what did the pharmacy advise?)  Preferred Pharmacy (with phone number or street name): [facility] - , [place] [phone] (Phone) [phone] (Fax)  Agent: Please be advised that RX refills may take up to 3 business days. We ask that you follow-up with your pharmacy.

## 2018-08-06 NOTE — Telephone Encounter (Signed)
New therapy has been resent to [facility].

## 2018-08-06 NOTE — Telephone Encounter (Signed)
Copied from [place] [phone]. Topic: Quick Communication - See Telephone Encounter >> Aug 06, 2018  4:14 PM Blase Mess A wrote: CRM for notification. See Telephone encounter for: 08/06/18.  [place] is calling to check on the the  PA for rx Glycopyrrolate [facility] REFILL KIT) 25 MCG/ML SOLN [[id number]] .  They would like to know if Dr. Ronnald Ramp wants to send a PA or change to a different medication. If it is changed, they will need a new rx, one for the medication and one for the machine  Please advise. If it can be faxed to [facility] [phone] Thank you.

## 2018-08-06 NOTE — Telephone Encounter (Signed)
Marisue Ivan called back following up on this.  Can you help please?

## 2018-08-06 NOTE — Telephone Encounter (Signed)
Copied from CRM [id number]. Topic: Quick Communication - See Telephone Encounter >> Aug 06, 2018 11:22 AM Arlyss Gandy, NT wrote: CRM for notification. See Telephone encounter for: 08/06/18. Marisue Ivan the nurse with [facility] states pt is still having a lot of chest congestion and would like to see if an order for mucinex can be sent to [facility] in [place]. Fax#: [phone] CB#: [phone]

## 2018-08-07 DIAGNOSIS — I5022 Chronic systolic (congestive) heart failure: Secondary | ICD-10-CM | POA: Diagnosis not present

## 2018-08-07 DIAGNOSIS — E1143 Type 2 diabetes mellitus with diabetic autonomic (poly)neuropathy: Secondary | ICD-10-CM | POA: Diagnosis not present

## 2018-08-07 DIAGNOSIS — R2689 Other abnormalities of gait and mobility: Secondary | ICD-10-CM | POA: Diagnosis not present

## 2018-08-07 DIAGNOSIS — J9611 Chronic respiratory failure with hypoxia: Secondary | ICD-10-CM | POA: Diagnosis not present

## 2018-08-07 DIAGNOSIS — M150 Primary generalized (osteo)arthritis: Secondary | ICD-10-CM | POA: Diagnosis not present

## 2018-08-07 DIAGNOSIS — I110 Hypertensive heart disease with heart failure: Secondary | ICD-10-CM | POA: Diagnosis not present

## 2018-08-07 NOTE — Telephone Encounter (Signed)
We are not able to the medication to the patient per [employer]. They stated that the [facility] is not one the pharmacies that is able to dispense this medication.    Spoke to Steele with [facility] and she is finding out some information for me.    [facility]  [address], [place], [place] [address]

## 2018-08-07 NOTE — Telephone Encounter (Signed)
Key: [id number]

## 2018-08-10 DIAGNOSIS — I5022 Chronic systolic (congestive) heart failure: Secondary | ICD-10-CM | POA: Diagnosis not present

## 2018-08-10 DIAGNOSIS — E1143 Type 2 diabetes mellitus with diabetic autonomic (poly)neuropathy: Secondary | ICD-10-CM | POA: Diagnosis not present

## 2018-08-10 DIAGNOSIS — M150 Primary generalized (osteo)arthritis: Secondary | ICD-10-CM | POA: Diagnosis not present

## 2018-08-10 DIAGNOSIS — I110 Hypertensive heart disease with heart failure: Secondary | ICD-10-CM | POA: Diagnosis not present

## 2018-08-10 DIAGNOSIS — R2689 Other abnormalities of gait and mobility: Secondary | ICD-10-CM | POA: Diagnosis not present

## 2018-08-10 DIAGNOSIS — J9611 Chronic respiratory failure with hypoxia: Secondary | ICD-10-CM | POA: Diagnosis not present

## 2018-08-10 IMAGING — DX DG FOOT COMPLETE 3+V*L*
3 series · 3 of 3 positions shown · non-contrast
Comparison: None.

CLINICAL DATA: Swelling, painful for 4 days.  No known injury.

EXAM:
LEFT FOOT - COMPLETE 3+ VIEW

[foot ap]
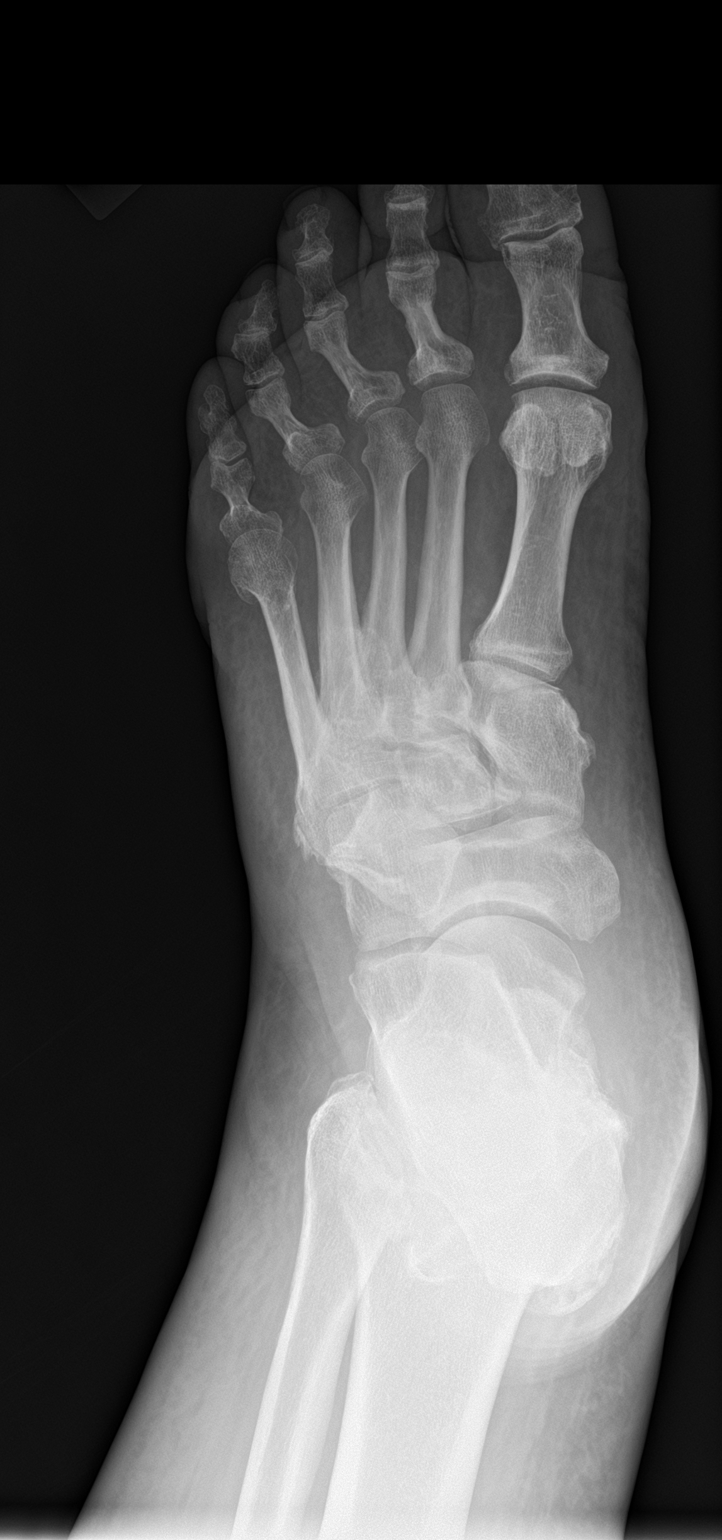

[foot obl]
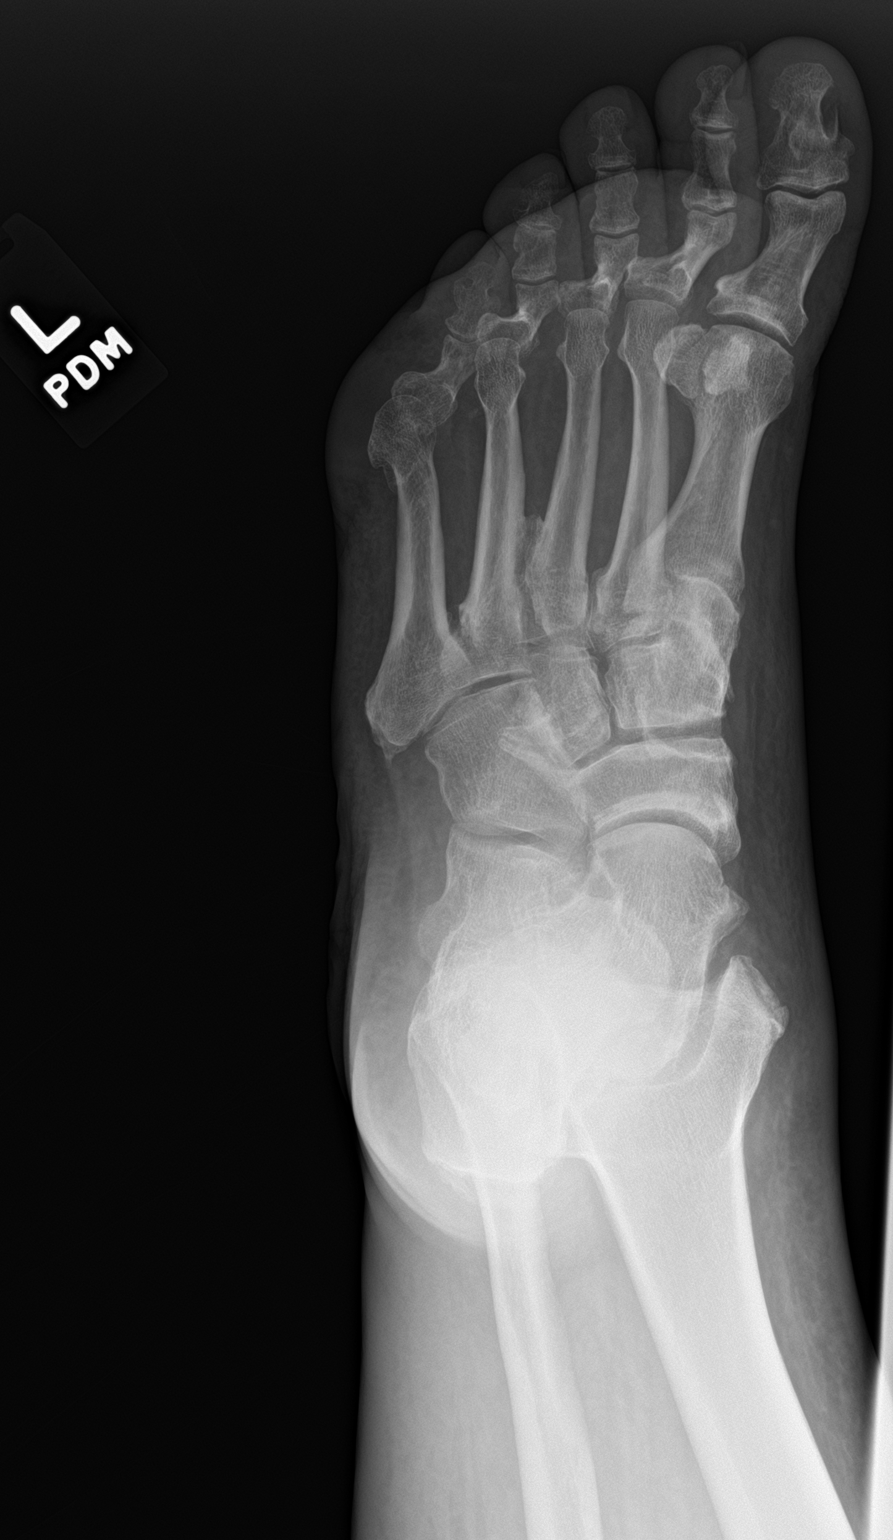

[foot lat]
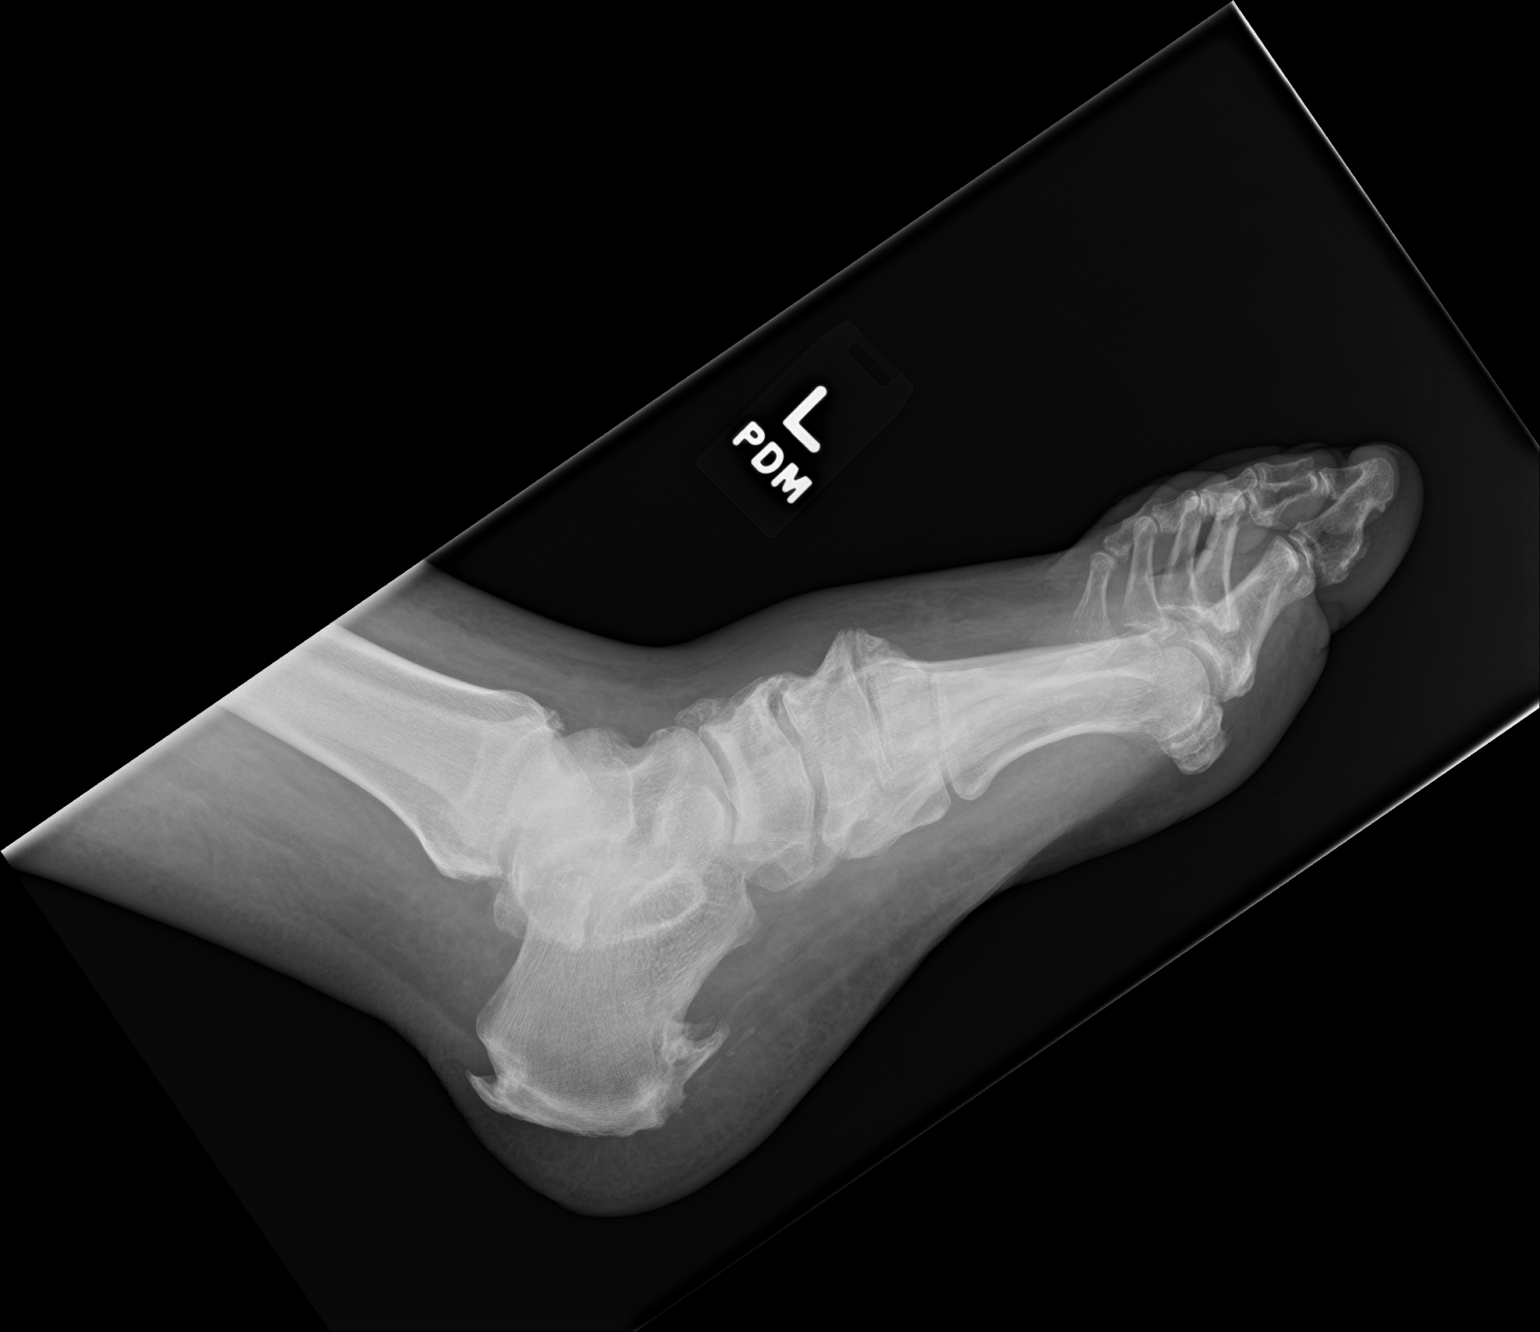

[3 of 3 positions shown; findings below may reference images not displayed]

FINDINGS: There is no evidence of fracture or dislocation. There is mild
osteoarthritis of the first tarsometatarsal joint. There is mild
osteoarthritis of the navicular- medial cuneiform joint. There is a
plantar calcaneal spur. There is enthesopathic change at the
Achilles tendon insertion. There is soft tissue swelling along the
dorsal aspect of the foot.
IMPRESSION: No acute osseous injury of the left foot.

## 2018-08-11 DIAGNOSIS — M150 Primary generalized (osteo)arthritis: Secondary | ICD-10-CM | POA: Diagnosis not present

## 2018-08-11 DIAGNOSIS — E1143 Type 2 diabetes mellitus with diabetic autonomic (poly)neuropathy: Secondary | ICD-10-CM | POA: Diagnosis not present

## 2018-08-11 DIAGNOSIS — I110 Hypertensive heart disease with heart failure: Secondary | ICD-10-CM | POA: Diagnosis not present

## 2018-08-11 DIAGNOSIS — I5022 Chronic systolic (congestive) heart failure: Secondary | ICD-10-CM | POA: Diagnosis not present

## 2018-08-11 DIAGNOSIS — J9611 Chronic respiratory failure with hypoxia: Secondary | ICD-10-CM | POA: Diagnosis not present

## 2018-08-11 DIAGNOSIS — R2689 Other abnormalities of gait and mobility: Secondary | ICD-10-CM | POA: Diagnosis not present

## 2018-08-12 DIAGNOSIS — E1143 Type 2 diabetes mellitus with diabetic autonomic (poly)neuropathy: Secondary | ICD-10-CM | POA: Diagnosis not present

## 2018-08-12 DIAGNOSIS — I5022 Chronic systolic (congestive) heart failure: Secondary | ICD-10-CM | POA: Diagnosis not present

## 2018-08-12 DIAGNOSIS — R2689 Other abnormalities of gait and mobility: Secondary | ICD-10-CM | POA: Diagnosis not present

## 2018-08-12 DIAGNOSIS — I110 Hypertensive heart disease with heart failure: Secondary | ICD-10-CM | POA: Diagnosis not present

## 2018-08-12 DIAGNOSIS — J9611 Chronic respiratory failure with hypoxia: Secondary | ICD-10-CM | POA: Diagnosis not present

## 2018-08-12 DIAGNOSIS — M150 Primary generalized (osteo)arthritis: Secondary | ICD-10-CM | POA: Diagnosis not present

## 2018-08-14 ENCOUNTER — Ambulatory Visit (INDEPENDENT_AMBULATORY_CARE_PROVIDER_SITE_OTHER): Payer: Medicare Other | Admitting: Student

## 2018-08-14 ENCOUNTER — Encounter: Payer: Self-pay | Admitting: Student

## 2018-08-14 VITALS — BP 128/72 | HR 84 | Ht 69.0 in | Wt 248.0 lb

## 2018-08-14 DIAGNOSIS — I110 Hypertensive heart disease with heart failure: Secondary | ICD-10-CM | POA: Diagnosis not present

## 2018-08-14 DIAGNOSIS — I5022 Chronic systolic (congestive) heart failure: Secondary | ICD-10-CM | POA: Diagnosis not present

## 2018-08-14 DIAGNOSIS — E1143 Type 2 diabetes mellitus with diabetic autonomic (poly)neuropathy: Secondary | ICD-10-CM | POA: Diagnosis not present

## 2018-08-14 DIAGNOSIS — I10 Essential (primary) hypertension: Secondary | ICD-10-CM | POA: Diagnosis not present

## 2018-08-14 DIAGNOSIS — E785 Hyperlipidemia, unspecified: Secondary | ICD-10-CM

## 2018-08-14 DIAGNOSIS — I482 Chronic atrial fibrillation, unspecified: Secondary | ICD-10-CM

## 2018-08-14 DIAGNOSIS — J9611 Chronic respiratory failure with hypoxia: Secondary | ICD-10-CM | POA: Diagnosis not present

## 2018-08-14 DIAGNOSIS — I5042 Chronic combined systolic (congestive) and diastolic (congestive) heart failure: Secondary | ICD-10-CM

## 2018-08-14 DIAGNOSIS — M150 Primary generalized (osteo)arthritis: Secondary | ICD-10-CM | POA: Diagnosis not present

## 2018-08-14 DIAGNOSIS — R2689 Other abnormalities of gait and mobility: Secondary | ICD-10-CM | POA: Diagnosis not present

## 2018-08-14 DIAGNOSIS — Z952 Presence of prosthetic heart valve: Secondary | ICD-10-CM

## 2018-08-14 NOTE — Patient Instructions (Signed)
Medication Instructions:  Your physician recommends that you continue on your current medications as directed. Please refer to the Current Medication list given to you today.   Labwork: none  Testing/Procedures: none  Follow-Up:  Your physician recommends that you schedule a follow-up appointment in: 2-3 months  Any Other Special Instructions Will Be Listed Below (If Applicable).  If you need a refill on your cardiac medications before your next appointment, please call your pharmacy.  
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
## 2018-08-18 DIAGNOSIS — I110 Hypertensive heart disease with heart failure: Secondary | ICD-10-CM | POA: Diagnosis not present

## 2018-08-18 DIAGNOSIS — I5022 Chronic systolic (congestive) heart failure: Secondary | ICD-10-CM | POA: Diagnosis not present

## 2018-08-18 DIAGNOSIS — J9611 Chronic respiratory failure with hypoxia: Secondary | ICD-10-CM | POA: Diagnosis not present

## 2018-08-18 DIAGNOSIS — R2689 Other abnormalities of gait and mobility: Secondary | ICD-10-CM | POA: Diagnosis not present

## 2018-08-18 DIAGNOSIS — E1143 Type 2 diabetes mellitus with diabetic autonomic (poly)neuropathy: Secondary | ICD-10-CM | POA: Diagnosis not present

## 2018-08-18 DIAGNOSIS — M150 Primary generalized (osteo)arthritis: Secondary | ICD-10-CM | POA: Diagnosis not present

## 2018-08-18 NOTE — Telephone Encounter (Signed)
The PA has been approved.   I will call his pharmacy and inform of same.

## 2018-08-19 DIAGNOSIS — I5022 Chronic systolic (congestive) heart failure: Secondary | ICD-10-CM | POA: Diagnosis not present

## 2018-08-19 DIAGNOSIS — J9611 Chronic respiratory failure with hypoxia: Secondary | ICD-10-CM | POA: Diagnosis not present

## 2018-08-19 DIAGNOSIS — E1143 Type 2 diabetes mellitus with diabetic autonomic (poly)neuropathy: Secondary | ICD-10-CM | POA: Diagnosis not present

## 2018-08-19 DIAGNOSIS — R2689 Other abnormalities of gait and mobility: Secondary | ICD-10-CM | POA: Diagnosis not present

## 2018-08-19 DIAGNOSIS — I110 Hypertensive heart disease with heart failure: Secondary | ICD-10-CM | POA: Diagnosis not present

## 2018-08-19 DIAGNOSIS — M150 Primary generalized (osteo)arthritis: Secondary | ICD-10-CM | POA: Diagnosis not present

## 2018-08-19 NOTE — Telephone Encounter (Signed)
Contacted pharmacy and informed PA has been approved.

## 2018-08-21 ENCOUNTER — Telehealth: Payer: Self-pay | Admitting: Internal Medicine

## 2018-08-21 NOTE — Telephone Encounter (Signed)
Copied from CRM [id number]. Topic: General - Other >> Aug 21, 2018 11:56 AM Tamela Oddi wrote: Reason for CRM: Victorino Dike from [facility] called to request that the patient's medication, ondansetron (ZOFRAN) 4 MG tablet, be D/C from his medication list because he is not taking it at the moment.  Please advise and call back at [phone]

## 2018-08-23 DIAGNOSIS — I872 Venous insufficiency (chronic) (peripheral): Secondary | ICD-10-CM | POA: Diagnosis not present

## 2018-08-23 DIAGNOSIS — I5022 Chronic systolic (congestive) heart failure: Secondary | ICD-10-CM | POA: Diagnosis not present

## 2018-08-23 DIAGNOSIS — Z96641 Presence of right artificial hip joint: Secondary | ICD-10-CM | POA: Diagnosis not present

## 2018-08-23 DIAGNOSIS — K3184 Gastroparesis: Secondary | ICD-10-CM | POA: Diagnosis not present

## 2018-08-23 DIAGNOSIS — J988 Other specified respiratory disorders: Secondary | ICD-10-CM | POA: Diagnosis not present

## 2018-08-23 DIAGNOSIS — E1143 Type 2 diabetes mellitus with diabetic autonomic (poly)neuropathy: Secondary | ICD-10-CM | POA: Diagnosis not present

## 2018-08-23 DIAGNOSIS — F332 Major depressive disorder, recurrent severe without psychotic features: Secondary | ICD-10-CM | POA: Diagnosis not present

## 2018-08-23 DIAGNOSIS — M150 Primary generalized (osteo)arthritis: Secondary | ICD-10-CM | POA: Diagnosis not present

## 2018-08-23 DIAGNOSIS — J449 Chronic obstructive pulmonary disease, unspecified: Secondary | ICD-10-CM | POA: Diagnosis not present

## 2018-08-23 DIAGNOSIS — R2689 Other abnormalities of gait and mobility: Secondary | ICD-10-CM | POA: Diagnosis not present

## 2018-08-23 DIAGNOSIS — D519 Vitamin B12 deficiency anemia, unspecified: Secondary | ICD-10-CM | POA: Diagnosis not present

## 2018-08-23 DIAGNOSIS — I2510 Atherosclerotic heart disease of native coronary artery without angina pectoris: Secondary | ICD-10-CM | POA: Diagnosis not present

## 2018-08-23 DIAGNOSIS — Z794 Long term (current) use of insulin: Secondary | ICD-10-CM | POA: Diagnosis not present

## 2018-08-23 DIAGNOSIS — Z87440 Personal history of urinary (tract) infections: Secondary | ICD-10-CM | POA: Diagnosis not present

## 2018-08-23 DIAGNOSIS — Z953 Presence of xenogenic heart valve: Secondary | ICD-10-CM | POA: Diagnosis not present

## 2018-08-23 DIAGNOSIS — M6281 Muscle weakness (generalized): Secondary | ICD-10-CM | POA: Diagnosis not present

## 2018-08-23 DIAGNOSIS — I110 Hypertensive heart disease with heart failure: Secondary | ICD-10-CM | POA: Diagnosis not present

## 2018-08-23 DIAGNOSIS — Z7901 Long term (current) use of anticoagulants: Secondary | ICD-10-CM | POA: Diagnosis not present

## 2018-08-23 DIAGNOSIS — Z9981 Dependence on supplemental oxygen: Secondary | ICD-10-CM | POA: Diagnosis not present

## 2018-08-23 DIAGNOSIS — G4733 Obstructive sleep apnea (adult) (pediatric): Secondary | ICD-10-CM | POA: Diagnosis not present

## 2018-08-23 DIAGNOSIS — J9611 Chronic respiratory failure with hypoxia: Secondary | ICD-10-CM | POA: Diagnosis not present

## 2018-08-24 DIAGNOSIS — M150 Primary generalized (osteo)arthritis: Secondary | ICD-10-CM | POA: Diagnosis not present

## 2018-08-24 DIAGNOSIS — I110 Hypertensive heart disease with heart failure: Secondary | ICD-10-CM | POA: Diagnosis not present

## 2018-08-24 DIAGNOSIS — R2689 Other abnormalities of gait and mobility: Secondary | ICD-10-CM | POA: Diagnosis not present

## 2018-08-24 DIAGNOSIS — J9611 Chronic respiratory failure with hypoxia: Secondary | ICD-10-CM | POA: Diagnosis not present

## 2018-08-24 DIAGNOSIS — I5022 Chronic systolic (congestive) heart failure: Secondary | ICD-10-CM | POA: Diagnosis not present

## 2018-08-24 DIAGNOSIS — J449 Chronic obstructive pulmonary disease, unspecified: Secondary | ICD-10-CM | POA: Diagnosis not present

## 2018-08-26 ENCOUNTER — Ambulatory Visit (INDEPENDENT_AMBULATORY_CARE_PROVIDER_SITE_OTHER): Payer: Medicare Other | Admitting: Internal Medicine

## 2018-08-26 ENCOUNTER — Encounter: Payer: Self-pay | Admitting: Internal Medicine

## 2018-08-26 VITALS — BP 140/62 | HR 82 | Temp 98.3°F | Resp 16 | Ht 69.0 in | Wt 247.0 lb

## 2018-08-26 DIAGNOSIS — I10 Essential (primary) hypertension: Secondary | ICD-10-CM

## 2018-08-26 DIAGNOSIS — E118 Type 2 diabetes mellitus with unspecified complications: Secondary | ICD-10-CM | POA: Diagnosis not present

## 2018-08-26 DIAGNOSIS — D510 Vitamin B12 deficiency anemia due to intrinsic factor deficiency: Secondary | ICD-10-CM | POA: Diagnosis not present

## 2018-08-26 DIAGNOSIS — J301 Allergic rhinitis due to pollen: Secondary | ICD-10-CM | POA: Diagnosis not present

## 2018-08-26 DIAGNOSIS — G4733 Obstructive sleep apnea (adult) (pediatric): Secondary | ICD-10-CM

## 2018-08-26 MED ORDER — CYANOCOBALAMIN 1000 MCG/ML IJ SOLN
1000.0000 ug | Freq: Once | INTRAMUSCULAR | Status: AC
  Administered 2018-08-26: 1000 ug via INTRAMUSCULAR

## 2018-08-26 MED ORDER — LEVOCETIRIZINE DIHYDROCHLORIDE 5 MG PO TABS: 5.0000 mg | ORAL_TABLET | Freq: Every evening | ORAL | 1 refills | Status: DC

## 2018-08-26 MED ORDER — CYANOCOBALAMIN 1000 MCG/ML IJ SOLN: 1000.0000 ug | INTRAMUSCULAR | 0 refills | Status: DC

## 2018-08-26 NOTE — Patient Instructions (Signed)
Type 2 Diabetes Mellitus, Diagnosis, Adult Type 2 diabetes (type 2 diabetes mellitus) is a long-term (chronic) disease. In type 2 diabetes, one or both of these problems may be present:  The pancreas does not make enough of a hormone called insulin.  Cells in the body do not respond properly to insulin that the body makes (insulin resistance). Normally, insulin allows blood sugar (glucose) to enter cells in the body. The cells use glucose for energy. Insulin resistance or lack of insulin causes excess glucose to build up in the blood instead of going into cells. As a result, high blood glucose (hyperglycemia) develops. What increases the risk? The following factors may make you more likely to develop type 2 diabetes:  Having a family member with type 2 diabetes.  Being overweight or obese.  Having an inactive (sedentary) lifestyle.  Having been diagnosed with insulin resistance.  Having a history of prediabetes, gestational diabetes, or polycystic ovary syndrome (PCOS).  Being of American-Indian, African-American, Hispanic/Latino, or Asian/Pacific Islander descent. What are the signs or symptoms? In the early stage of this condition, you may not have symptoms. Symptoms develop slowly and may include:  Increased thirst (polydipsia).  Increased hunger(polyphagia).  Increased urination (polyuria).  Increased urination during the night (nocturia).  Unexplained weight loss.  Frequent infections that keep coming back (recurring).  Fatigue.  Weakness.  Vision changes, such as blurry vision.  Cuts or bruises that are slow to heal.  Tingling or numbness in the hands or feet.  Dark patches on the skin (acanthosis nigricans). How is this diagnosed? This condition is diagnosed based on your symptoms, your medical history, a physical exam, and your blood glucose level. Your blood glucose may be checked with one or more of the following blood tests:  A fasting blood glucose (FBG)  test. You will not be allowed to eat (you will fast) for 8 hours or longer before a blood sample is taken.  A random blood glucose test. This test checks blood glucose at any time of day regardless of when you ate.  An A1c (hemoglobin A1c) blood test. This test provides information about blood glucose control over the previous 2-3 months.  An oral glucose tolerance test (OGTT). This test measures your blood glucose at two times: ? After fasting. This is your baseline blood glucose level. ? Two hours after drinking a beverage that contains glucose. You may be diagnosed with type 2 diabetes if:  Your FBG level is 126 mg/dL (7.0 mmol/L) or higher.  Your random blood glucose level is 200 mg/dL (11.1 mmol/L) or higher.  Your A1c level is 6.5% or higher.  Your OGTT result is higher than 200 mg/dL (11.1 mmol/L). These blood tests may be repeated to confirm your diagnosis. How is this treated? Your treatment may be managed by a specialist called an endocrinologist. Type 2 diabetes may be treated by following instructions from your health care provider about:  Making diet and lifestyle changes. This may include: ? Following an individualized nutrition plan that is developed by a diet and nutrition specialist (registered dietitian). ? Exercising regularly. ? Finding ways to manage stress.  Checking your blood glucose level as often as told.  Taking diabetes medicines or insulin daily. This helps to keep your blood glucose levels in the healthy range. ? If you use insulin, you may need to adjust the dosage depending on how physically active you are and what foods you eat. Your health care provider will tell you how to adjust your dosage.    Taking medicines to help prevent complications from diabetes, such as: ? Aspirin. ? Medicine to lower cholesterol. ? Medicine to control blood pressure. Your health care provider will set individualized treatment goals for you. Your goals will be based on  your age, other medical conditions you have, and how you respond to diabetes treatment. Generally, the goal of treatment is to maintain the following blood glucose levels:  Before meals (preprandial): 80-130 mg/dL (4.4-7.2 mmol/L).  After meals (postprandial): below 180 mg/dL (10 mmol/L).  A1c level: less than 7%. Follow these instructions at home: Questions to ask your health care provider  Consider asking the following questions: ? Do I need to meet with a diabetes educator? ? Where can I find a support group for people with diabetes? ? What equipment will I need to manage my diabetes at home? ? What diabetes medicines do I need, and when should I take them? ? How often do I need to check my blood glucose? ? What number can I call if I have questions? ? When is my next appointment? General instructions  Take over-the-counter and prescription medicines only as told by your health care provider.  Keep all follow-up visits as told by your health care provider. This is important.  For more information about diabetes, visit: ? American Diabetes Association (ADA): www.diabetes.org ? American Association of Diabetes Educators (AADE): www.diabeteseducator.org Contact a health care provider if:  Your blood glucose is at or above 240 mg/dL (13.3 mmol/L) for 2 days in a row.  You have been sick or have had a fever for 2 days or longer, and you are not getting better.  You have any of the following problems for more than 6 hours: ? You cannot eat or drink. ? You have nausea and vomiting. ? You have diarrhea. Get help right away if:  Your blood glucose is lower than 54 mg/dL (3.0 mmol/L).  You become confused or you have trouble thinking clearly.  You have difficulty breathing.  You have moderate or large ketone levels in your urine. Summary  Type 2 diabetes (type 2 diabetes mellitus) is a long-term (chronic) disease. In type 2 diabetes, the pancreas does not make enough of a  hormone called insulin, or cells in the body do not respond properly to insulin that the body makes (insulin resistance).  This condition is treated by making diet and lifestyle changes and taking diabetes medicines or insulin.  Your health care provider will set individualized treatment goals for you. Your goals will be based on your age, other medical conditions you have, and how you respond to diabetes treatment.  Keep all follow-up visits as told by your health care provider. This is important. This information is not intended to replace advice given to you by your health care provider. Make sure you discuss any questions you have with your health care provider. Document Released: 06/24/2005 Document Revised: 01/23/2017 Document Reviewed: 07/28/2015 Elsevier Interactive Patient Education  2019 [employer]  

## 2018-08-26 NOTE — Progress Notes (Addendum)
Subjective:  Patient ID: Cody Hale, male    DOB: Jun 21, 1951  Age: 68 y.o. MRN: [id number]  CC: Allergic Rhinitis  and Anemia   HPI Cody Hale presents for f/up - He feels much better than he did last time.  His only lingering symptom is dry itchy skin, runny nose, postnasal drip, and nonproductive cough.  He is using and benefiting from the use of his BPAP unit.  Outpatient Medications Prior to Visit  Medication Sig Dispense Refill  . Balsam [place]-Castor Oil (VENELEX) [place] Apply residents buttocks every shift    . dapagliflozin propanediol (FARXIGA) 10 MG TABS tablet Take 10 mg by mouth daily. 90 tablet 1  . fluticasone (FLONASE) 50 MCG/ACT nasal spray Place 2 sprays into both nostrils daily. 16 g 5  . Glycopyrrolate (LONHALA MAGNAIR REFILL KIT) 25 MCG/ML SOLN Inhale 1 puff into the lungs 2 (two) times daily. 60 mL 11  . Glycopyrrolate (LONHALA MAGNAIR STARTER KIT) 25 MCG/ML SOLN Inhale 1 puff into the lungs 2 (two) times daily. 60 mL 11  . Insulin Degludec (TRESIBA FLEXTOUCH) 200 UNIT/ML SOPN Inject 20 Units into the skin daily. 9 mL 2  . Insulin Pen Needle (PEN NEEDLES) 30G X 8 MM MISC Inject 1 pen into the skin daily. 90 each 3  . losartan (COZAAR) 25 MG tablet Take 0.5 tablets (12.5 mg total) by mouth daily. 45 tablet 3  . Magnesium Oxide 400 (240 Mg) MG TABS TAKE 1 TABLET BY MOUTH TWICE DAILY. 60 tablet 3  . metoCLOPramide (REGLAN) 5 MG tablet TAKE 1 TABLET BY MOUTH 3 TIMES DAILY BEFORE MEALS. 90 tablet 3  . nitroGLYCERIN (NITROSTAT) 0.4 MG SL tablet Place 1 tablet (0.4 mg total) under the tongue every 5 (five) minutes as needed for chest pain. Max 3 doses. 10 tablet 0  . nystatin (MYCOSTATIN/NYSTOP) powder Apply topically 3 (three) times daily. APPLY TO GROIN AREA 15 g 0  . ondansetron (ZOFRAN) 4 MG tablet Take 4 mg by mouth every 6 (six) hours as needed for nausea or vomiting.    . pantoprazole (PROTONIX) 40 MG tablet Take 1 tablet (40 mg total) by mouth daily. 90 tablet 1  .  polyethylene glycol (MIRALAX) packet Take 17 g by mouth daily. 14 each 0  . potassium chloride SA (K-DUR,KLOR-CON) 20 MEQ tablet TAKE 1 TABLET BY MOUTH TWICE DAILY. 60 tablet 3  . simvastatin (ZOCOR) 20 MG tablet Take 1 tablet (20 mg total) by mouth every morning.    . torsemide (DEMADEX) 20 MG tablet Take 40 mg in the morning and 20 mg in the evening. 180 tablet 3  . Vilazodone HCl (VIIBRYD) 40 MG TABS Take 1 tablet (40 mg total) by mouth daily.    Alveda Reasons 20 MG TABS tablet TAKE 1 TABLET BY MOUTH ONCE DAILY. 30 tablet 5  . levocetirizine (XYZAL) 5 MG tablet Take 1 tablet (5 mg total) by mouth every evening. 90 tablet 1  . carvedilol (COREG) 3.125 MG tablet Take 1 tablet (3.125 mg total) by mouth 2 (two) times daily. 180 tablet 3   No facility-administered medications prior to visit.     ROS Review of Systems  Constitutional: Negative.  Negative for chills, diaphoresis, fatigue, fever and unexpected weight change.  HENT: Positive for postnasal drip and rhinorrhea. Negative for congestion, facial swelling, sinus pressure, sinus pain, sore throat and trouble swallowing.   Eyes: Negative.   Respiratory: Positive for cough. Negative for chest tightness and shortness of breath.   Cardiovascular: Negative  for chest pain, palpitations and leg swelling.  Gastrointestinal: Negative for abdominal pain, constipation, diarrhea and vomiting.  Endocrine: Negative.   Genitourinary: Negative.  Negative for difficulty urinating.  Musculoskeletal: Negative.  Negative for arthralgias.  Skin: Positive for rash. Negative for color change.  Neurological: Negative.  Negative for dizziness, weakness and light-headedness.  Hematological: Negative for adenopathy. Does not bruise/bleed easily.  Psychiatric/Behavioral: Negative.     Objective:  BP 140/62 (BP Location: Left Arm, Patient Position: Sitting, Cuff Size: Large)   Pulse 82   Temp 98.3 F (36.8 C) (Oral)   Resp 16   Ht _0  (1.753 m)   Wt 247 lb  (112 kg)   SpO2 98%   BMI 36.48 kg/m   BP Readings from Last 3 Encounters:  08/26/18 140/62  08/14/18 128/72  08/05/18 110/68    Wt Readings from Last 3 Encounters:  08/26/18 247 lb (112 kg)  08/14/18 248 lb (112.5 kg)  08/05/18 242 lb (109.8 kg)    Physical Exam Vitals signs reviewed.  Constitutional:      Appearance: He is obese. He is not ill-appearing or diaphoretic.  HENT:     Nose: Nose normal. No congestion or rhinorrhea.     Right Sinus: No maxillary sinus tenderness or frontal sinus tenderness.     Left Sinus: No maxillary sinus tenderness or frontal sinus tenderness.     Mouth/Throat:     Mouth: Mucous membranes are moist.     Pharynx: Oropharynx is clear. No oropharyngeal exudate or posterior oropharyngeal erythema.  Eyes:     General: No scleral icterus.    Conjunctiva/sclera: Conjunctivae normal.  Neck:     Musculoskeletal: Normal range of motion and neck supple. No muscular tenderness.  Cardiovascular:     Rate and Rhythm: Normal rate and regular rhythm.     Pulses: Normal pulses.     Heart sounds: No murmur. No gallop.   Pulmonary:     Effort: Pulmonary effort is normal. No respiratory distress.     Breath sounds: No stridor. No wheezing, rhonchi or rales.  Abdominal:     General: Abdomen is flat. Bowel sounds are normal.     Palpations: There is no hepatomegaly, splenomegaly or mass.     Tenderness: There is no abdominal tenderness.  Musculoskeletal: Normal range of motion.        General: No swelling.     Right lower leg: No edema.     Left lower leg: No edema.  Lymphadenopathy:     Cervical: No cervical adenopathy.  Skin:    General: Skin is warm and dry.     Coloration: Skin is not pale.     Comments: + xerosis and a few excoriations but no discrete epidermal lesions.  Neurological:     General: No focal deficit present.     Mental Status: He is oriented to person, place, and time. Mental status is at baseline.     Lab Results  Component  Value Date   WBC 5.6 07/22/2018   HGB 12.1 (L) 07/22/2018   HCT 37.1 (L) 07/22/2018   PLT 152.0 07/22/2018   GLUCOSE 209 (H) 07/22/2018   CHOL 90 04/27/2018   TRIG 93.0 04/27/2018   HDL 39.10 04/27/2018   LDLDIRECT 84.2 01/31/2014   LDLCALC 32 04/27/2018   ALT 17 06/09/2018   AST 28 06/09/2018   NA 138 07/22/2018   K 4.5 07/22/2018   CL 96 07/22/2018   CREATININE 1.13 07/22/2018   BUN 17  07/22/2018   CO2 36 (H) 07/22/2018   TSH 1.54 07/22/2018   PSA 0.36 05/30/2014   INR 2.61 02/10/2017   HGBA1C 7.8 (A) 07/22/2018   MICROALBUR 1.7 07/22/2018    Dg Chest 2 View  Result Date: 08/05/2018 CLINICAL DATA:  Cough, congestion and shortness of breath for 5 days. EXAM: CHEST - 2 VIEW COMPARISON:  Chest x-ray 05/21/2018 and chest CT 05/19/2018 FINDINGS: The heart is mildly enlarged but stable. Stable mild tortuosity and calcification of the thoracic aorta. 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 No acute pulmonary findings. No pleural effusion. No worrisome pulmonary lesions. The bony thorax is intact. IMPRESSION: No acute cardiopulmonary findings.  Mild stable cardiac enlargement. Electronically Signed   By: Marijo Sanes M.D.   On: 08/05/2018 14:17    Assessment & Plan:   Dinh was seen today for allergic rhinitis  and anemia.  Diagnoses and all orders for this visit:  Seasonal allergic rhinitis due to pollen  Acute nonseasonal allergic rhinitis due to pollen- I have asked him to restart Xyzal. -     levocetirizine (XYZAL) 5 MG tablet; Take 1 tablet (5 mg total) by mouth every evening.  Essential hypertension, malignant- His blood pressure is adequately well controlled.  Type II diabetes mellitus with manifestations ([place])- his recent A1c was 7.8%.  He tells me this is the best that he can do with respect to blood sugar control.  Vitamin B12 deficiency anemia due to intrinsic factor deficiency- Will continue monthly parenteral B12 replacement therapy. -     cyanocobalamin  ((VITAMIN B-12)) injection 1,000 mcg  Other orders -     cyanocobalamin (,VITAMIN B-12,) 1000 MCG/ML injection; Inject 1 mL (1,000 mcg total) into the muscle every 30 (thirty) days.   I am having Jesselee Polo "Shanon Brow" start on cyanocobalamin. I am also having him maintain his fluticasone, nitroGLYCERIN, XARELTO, polyethylene glycol, pantoprazole, carvedilol, simvastatin, Vilazodone HCl, nystatin, VENELEX, ondansetron, losartan, torsemide, Magnesium Oxide, metoCLOPramide, potassium chloride SA, dapagliflozin propanediol, Insulin Degludec, Pen Needles, Glycopyrrolate, Glycopyrrolate, and levocetirizine. We administered cyanocobalamin.  Meds ordered this encounter  Medications  . levocetirizine (XYZAL) 5 MG tablet    Sig: Take 1 tablet (5 mg total) by mouth every evening.    Dispense:  90 tablet    Refill:  1  . cyanocobalamin ((VITAMIN B-12)) injection 1,000 mcg  . cyanocobalamin (,VITAMIN B-12,) 1000 MCG/ML injection    Sig: Inject 1 mL (1,000 mcg total) into the muscle every 30 (thirty) days.    Dispense:  3 mL    Refill:  0     Follow-up: Return in about 4 months (around 12/25/2018).  Scarlette Calico, MD

## 2018-08-27 DIAGNOSIS — I110 Hypertensive heart disease with heart failure: Secondary | ICD-10-CM | POA: Diagnosis not present

## 2018-08-27 DIAGNOSIS — J9611 Chronic respiratory failure with hypoxia: Secondary | ICD-10-CM | POA: Diagnosis not present

## 2018-08-27 DIAGNOSIS — I5022 Chronic systolic (congestive) heart failure: Secondary | ICD-10-CM | POA: Diagnosis not present

## 2018-08-27 DIAGNOSIS — M150 Primary generalized (osteo)arthritis: Secondary | ICD-10-CM | POA: Diagnosis not present

## 2018-08-27 DIAGNOSIS — R2689 Other abnormalities of gait and mobility: Secondary | ICD-10-CM | POA: Diagnosis not present

## 2018-08-27 DIAGNOSIS — J449 Chronic obstructive pulmonary disease, unspecified: Secondary | ICD-10-CM | POA: Diagnosis not present

## 2018-08-31 DIAGNOSIS — Z794 Long term (current) use of insulin: Secondary | ICD-10-CM | POA: Diagnosis not present

## 2018-08-31 DIAGNOSIS — I2510 Atherosclerotic heart disease of native coronary artery without angina pectoris: Secondary | ICD-10-CM

## 2018-08-31 DIAGNOSIS — I5022 Chronic systolic (congestive) heart failure: Secondary | ICD-10-CM | POA: Diagnosis not present

## 2018-08-31 DIAGNOSIS — G4733 Obstructive sleep apnea (adult) (pediatric): Secondary | ICD-10-CM

## 2018-08-31 DIAGNOSIS — F332 Major depressive disorder, recurrent severe without psychotic features: Secondary | ICD-10-CM

## 2018-08-31 DIAGNOSIS — D519 Vitamin B12 deficiency anemia, unspecified: Secondary | ICD-10-CM

## 2018-08-31 DIAGNOSIS — M6281 Muscle weakness (generalized): Secondary | ICD-10-CM | POA: Diagnosis not present

## 2018-08-31 DIAGNOSIS — J988 Other specified respiratory disorders: Secondary | ICD-10-CM

## 2018-08-31 DIAGNOSIS — Z7901 Long term (current) use of anticoagulants: Secondary | ICD-10-CM

## 2018-08-31 DIAGNOSIS — Z9981 Dependence on supplemental oxygen: Secondary | ICD-10-CM

## 2018-08-31 DIAGNOSIS — I110 Hypertensive heart disease with heart failure: Secondary | ICD-10-CM | POA: Diagnosis not present

## 2018-08-31 DIAGNOSIS — Z953 Presence of xenogenic heart valve: Secondary | ICD-10-CM

## 2018-08-31 DIAGNOSIS — J9611 Chronic respiratory failure with hypoxia: Secondary | ICD-10-CM | POA: Diagnosis not present

## 2018-08-31 DIAGNOSIS — R2689 Other abnormalities of gait and mobility: Secondary | ICD-10-CM | POA: Diagnosis not present

## 2018-08-31 DIAGNOSIS — E1143 Type 2 diabetes mellitus with diabetic autonomic (poly)neuropathy: Secondary | ICD-10-CM | POA: Diagnosis not present

## 2018-08-31 DIAGNOSIS — M150 Primary generalized (osteo)arthritis: Secondary | ICD-10-CM

## 2018-08-31 DIAGNOSIS — Z96641 Presence of right artificial hip joint: Secondary | ICD-10-CM

## 2018-08-31 DIAGNOSIS — K3184 Gastroparesis: Secondary | ICD-10-CM | POA: Diagnosis not present

## 2018-08-31 DIAGNOSIS — Z87440 Personal history of urinary (tract) infections: Secondary | ICD-10-CM

## 2018-08-31 DIAGNOSIS — J449 Chronic obstructive pulmonary disease, unspecified: Secondary | ICD-10-CM | POA: Diagnosis not present

## 2018-08-31 DIAGNOSIS — I872 Venous insufficiency (chronic) (peripheral): Secondary | ICD-10-CM

## 2018-08-31 NOTE — Assessment & Plan Note (Signed)
Imovement noted with the use of BPAP.

## 2018-09-01 DIAGNOSIS — J449 Chronic obstructive pulmonary disease, unspecified: Secondary | ICD-10-CM | POA: Diagnosis not present

## 2018-09-01 DIAGNOSIS — I110 Hypertensive heart disease with heart failure: Secondary | ICD-10-CM | POA: Diagnosis not present

## 2018-09-01 DIAGNOSIS — J9611 Chronic respiratory failure with hypoxia: Secondary | ICD-10-CM | POA: Diagnosis not present

## 2018-09-01 DIAGNOSIS — R2689 Other abnormalities of gait and mobility: Secondary | ICD-10-CM | POA: Diagnosis not present

## 2018-09-01 DIAGNOSIS — M150 Primary generalized (osteo)arthritis: Secondary | ICD-10-CM | POA: Diagnosis not present

## 2018-09-01 DIAGNOSIS — I5022 Chronic systolic (congestive) heart failure: Secondary | ICD-10-CM | POA: Diagnosis not present

## 2018-09-02 ENCOUNTER — Ambulatory Visit (INDEPENDENT_AMBULATORY_CARE_PROVIDER_SITE_OTHER): Payer: Medicare Other | Admitting: Orthopaedic Surgery

## 2018-09-03 DIAGNOSIS — I5022 Chronic systolic (congestive) heart failure: Secondary | ICD-10-CM | POA: Diagnosis not present

## 2018-09-03 DIAGNOSIS — J9611 Chronic respiratory failure with hypoxia: Secondary | ICD-10-CM | POA: Diagnosis not present

## 2018-09-03 DIAGNOSIS — I110 Hypertensive heart disease with heart failure: Secondary | ICD-10-CM | POA: Diagnosis not present

## 2018-09-03 DIAGNOSIS — M150 Primary generalized (osteo)arthritis: Secondary | ICD-10-CM | POA: Diagnosis not present

## 2018-09-03 DIAGNOSIS — R2689 Other abnormalities of gait and mobility: Secondary | ICD-10-CM | POA: Diagnosis not present

## 2018-09-03 DIAGNOSIS — J449 Chronic obstructive pulmonary disease, unspecified: Secondary | ICD-10-CM | POA: Diagnosis not present

## 2018-09-07 ENCOUNTER — Ambulatory Visit: Payer: Self-pay | Admitting: Internal Medicine

## 2018-09-07 DIAGNOSIS — I5022 Chronic systolic (congestive) heart failure: Secondary | ICD-10-CM | POA: Diagnosis not present

## 2018-09-07 DIAGNOSIS — I110 Hypertensive heart disease with heart failure: Secondary | ICD-10-CM | POA: Diagnosis not present

## 2018-09-07 DIAGNOSIS — M150 Primary generalized (osteo)arthritis: Secondary | ICD-10-CM | POA: Diagnosis not present

## 2018-09-07 DIAGNOSIS — J449 Chronic obstructive pulmonary disease, unspecified: Secondary | ICD-10-CM | POA: Diagnosis not present

## 2018-09-07 DIAGNOSIS — J9611 Chronic respiratory failure with hypoxia: Secondary | ICD-10-CM | POA: Diagnosis not present

## 2018-09-07 DIAGNOSIS — R2689 Other abnormalities of gait and mobility: Secondary | ICD-10-CM | POA: Diagnosis not present

## 2018-09-07 NOTE — Telephone Encounter (Signed)
Pt is currently on Tresiba.   What is the sig for the sliding scale and will they be administering the [place] or a new insulin?

## 2018-09-07 NOTE — Telephone Encounter (Signed)
Anything above 200 is concerning and should be treated with a sliding scale of regular insulin

## 2018-09-07 NOTE — Telephone Encounter (Signed)
Victorino Dike with [facility] called in regarding pt's elevated blood glucose levels  See triage notes.  Orders can be called to [facility] at [phone] or faxed to [phone].  I sent these notes to Dr. Yetta Barre' nurse pool for further disposition.  Reason for Disposition . [1] Blood glucose > 300 mg/dL (58.0 mmol/L) AND [9] two or more times in a row    [place] with [facility] called in.  Answer Assessment - Initial Assessment Questions 1. BLOOD GLUCOSE: "What is your blood glucose level?"      Victorino Dike with [facility] calling in.     Monday nights he goes out and has  Brett Albino with his church group.  Glucose  246 yesterday.  378 today.    He has Cheeze its and all kinds of snacks in his house.   "We have done education with him".   He is not really ever compliant but it's a matter of how much carbs and sweets he has. 2. ONSET: "When did you check the blood glucose?"     378 fasting this morning. 3. USUAL RANGE: "What is your glucose level usually?" (e.g., usual fasting morning value, usual evening value)     High 100s to low 200s 4. KETONES: "Do you check for ketones (urine or blood test strips)?" If yes, ask: "What does the test show now?"      N/A 5. TYPE 1 or 2:  "Do you know what type of diabetes you have?"  (e.g., Type 1, Type 2, Gestational; doesn't know)      Type 2.   20 units of Triciba. 6. INSULIN: "Do you take insulin?" "What type of insulin(s) do you use? What is the mode of delivery? (syringe, pen (e.g., injection or  pump)?"      See above. 7. DIABETES PILLS: "Do you take any pills for your diabetes?" If yes, ask: "Have you missed taking any pills recently?"     No 8. OTHER SYMPTOMS: "Do you have any symptoms?" (e.g., fever, frequent urination, difficulty breathing, dizziness, weakness, vomiting)     No other symptoms. 9. PREGNANCY: "Is there any chance you are pregnant?" "When was your last menstrual period?"     N/A  Protocols  used: DIABETES - HIGH BLOOD SUGAR-A-AH

## 2018-09-07 NOTE — Telephone Encounter (Signed)
Called Angel at [facility]. She stated that pt last blood sugars was 246, 219, 378 and 246. [facility] nurse was concerned about the 378 readings.   Please advise if rx should change/stay the same. Also, what reading should they be concerned with?

## 2018-09-08 ENCOUNTER — Other Ambulatory Visit: Payer: Self-pay | Admitting: Internal Medicine

## 2018-09-08 ENCOUNTER — Ambulatory Visit (INDEPENDENT_AMBULATORY_CARE_PROVIDER_SITE_OTHER): Payer: Medicare Other | Admitting: Orthopaedic Surgery

## 2018-09-08 ENCOUNTER — Encounter (INDEPENDENT_AMBULATORY_CARE_PROVIDER_SITE_OTHER): Payer: Self-pay | Admitting: Orthopaedic Surgery

## 2018-09-08 ENCOUNTER — Telehealth: Payer: Self-pay | Admitting: Internal Medicine

## 2018-09-08 ENCOUNTER — Ambulatory Visit (INDEPENDENT_AMBULATORY_CARE_PROVIDER_SITE_OTHER): Payer: Medicare Other

## 2018-09-08 DIAGNOSIS — E118 Type 2 diabetes mellitus with unspecified complications: Secondary | ICD-10-CM

## 2018-09-08 DIAGNOSIS — I110 Hypertensive heart disease with heart failure: Secondary | ICD-10-CM | POA: Diagnosis not present

## 2018-09-08 DIAGNOSIS — I5022 Chronic systolic (congestive) heart failure: Secondary | ICD-10-CM | POA: Diagnosis not present

## 2018-09-08 DIAGNOSIS — J9611 Chronic respiratory failure with hypoxia: Secondary | ICD-10-CM | POA: Diagnosis not present

## 2018-09-08 DIAGNOSIS — J449 Chronic obstructive pulmonary disease, unspecified: Secondary | ICD-10-CM | POA: Diagnosis not present

## 2018-09-08 DIAGNOSIS — Z96641 Presence of right artificial hip joint: Secondary | ICD-10-CM

## 2018-09-08 DIAGNOSIS — R2689 Other abnormalities of gait and mobility: Secondary | ICD-10-CM | POA: Diagnosis not present

## 2018-09-08 DIAGNOSIS — M1611 Unilateral primary osteoarthritis, right hip: Secondary | ICD-10-CM

## 2018-09-08 DIAGNOSIS — M150 Primary generalized (osteo)arthritis: Secondary | ICD-10-CM | POA: Diagnosis not present

## 2018-09-08 MED ORDER — ACETAMINOPHEN 500 MG PO TABS: 500.0000 mg | ORAL_TABLET | Freq: Four times a day (QID) | ORAL | 5 refills | Status: DC | PRN

## 2018-09-08 MED ORDER — INSULIN ASPART 100 UNIT/ML FLEXPEN: 5.0000 [IU] | PEN_INJECTOR | Freq: Three times a day (TID) | SUBCUTANEOUS | 5 refills | Status: DC

## 2018-09-08 NOTE — Telephone Encounter (Signed)
Stay on the current dose of Tresiba.  A new insulin prescription was sent to his pharmacy.

## 2018-09-08 NOTE — Telephone Encounter (Signed)
Copied from CRM [id number]. Topic: Quick Communication - Rx Refill/Question >> Sep 08, 2018 10:37 AM Cody Hale wrote: Medication: something for pain  Cody Hale with [facility] calling to ask for an order for a pain med.  Pt had a fall last night, complains of shoulder pain. She states he lost his balance and fell. Pt declined to go to the hospital. Pt does not have anything for pain, not even a tylenol they can give him. Cody Hale does not thinks he needs to see the dr. Valinda Hoar  [phone]

## 2018-09-08 NOTE — Telephone Encounter (Signed)
Cody Hale contacted and informed of same.

## 2018-09-08 NOTE — Progress Notes (Signed)
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  He is ambulate with a cane.  He says he is doing well and has no problems or hip pain at all.  He stays in some type of facility.  He does have a lot of dental work coming up involving fillings.  He does not have great-looking teeth so I do feel that antibiotics during dental procedures are appropriate for him.  I did give him prescription for amoxicillin to take prior to the procedure and after.  On examination both hips move smoothly.  His right operative hip moves just like the left side.  His incisions healed nicely he has no leg length discrepancy.  An AP pelvis and lateral the right hip shows a well-seated total hip arthroplasty with no evidence of loosening or complicating features.  At this point all question concerns were answered and addressed.  Follow-up can be as needed.  If he has any issues at all as relates that right hip pain is to come see [place].

## 2018-09-08 NOTE — Telephone Encounter (Signed)
Okay for tylenol order. If so, what is the sig for same?

## 2018-09-09 ENCOUNTER — Emergency Department (HOSPITAL_COMMUNITY): Payer: Medicare Other

## 2018-09-09 ENCOUNTER — Other Ambulatory Visit: Payer: Self-pay

## 2018-09-09 ENCOUNTER — Encounter (HOSPITAL_COMMUNITY): Payer: Self-pay

## 2018-09-09 ENCOUNTER — Emergency Department (HOSPITAL_COMMUNITY)
Admission: EM | Admit: 2018-09-09 | Discharge: 2018-09-09 | Disposition: A | Discharge status: Home / Self Care | Payer: Medicare Other | Attending: Emergency Medicine | Admitting: Emergency Medicine

## 2018-09-09 DIAGNOSIS — Y999 Unspecified external cause status: Secondary | ICD-10-CM | POA: Diagnosis not present

## 2018-09-09 DIAGNOSIS — E119 Type 2 diabetes mellitus without complications: Secondary | ICD-10-CM | POA: Diagnosis not present

## 2018-09-09 DIAGNOSIS — Z9181 History of falling: Secondary | ICD-10-CM | POA: Insufficient documentation

## 2018-09-09 DIAGNOSIS — Y929 Unspecified place or not applicable: Secondary | ICD-10-CM | POA: Insufficient documentation

## 2018-09-09 DIAGNOSIS — I5022 Chronic systolic (congestive) heart failure: Secondary | ICD-10-CM | POA: Insufficient documentation

## 2018-09-09 DIAGNOSIS — Z96641 Presence of right artificial hip joint: Secondary | ICD-10-CM | POA: Diagnosis not present

## 2018-09-09 DIAGNOSIS — Z79899 Other long term (current) drug therapy: Secondary | ICD-10-CM | POA: Insufficient documentation

## 2018-09-09 DIAGNOSIS — Z7901 Long term (current) use of anticoagulants: Secondary | ICD-10-CM | POA: Insufficient documentation

## 2018-09-09 DIAGNOSIS — J449 Chronic obstructive pulmonary disease, unspecified: Secondary | ICD-10-CM | POA: Insufficient documentation

## 2018-09-09 DIAGNOSIS — Z87891 Personal history of nicotine dependence: Secondary | ICD-10-CM | POA: Insufficient documentation

## 2018-09-09 DIAGNOSIS — M25512 Pain in left shoulder: Secondary | ICD-10-CM | POA: Diagnosis not present

## 2018-09-09 DIAGNOSIS — S59902A Unspecified injury of left elbow, initial encounter: Secondary | ICD-10-CM | POA: Diagnosis not present

## 2018-09-09 DIAGNOSIS — Z794 Long term (current) use of insulin: Secondary | ICD-10-CM | POA: Diagnosis not present

## 2018-09-09 DIAGNOSIS — Y939 Activity, unspecified: Secondary | ICD-10-CM | POA: Diagnosis not present

## 2018-09-09 DIAGNOSIS — W2209XA Striking against other stationary object, initial encounter: Secondary | ICD-10-CM | POA: Diagnosis not present

## 2018-09-09 DIAGNOSIS — M7989 Other specified soft tissue disorders: Secondary | ICD-10-CM | POA: Diagnosis not present

## 2018-09-09 DIAGNOSIS — S5002XA Contusion of left elbow, initial encounter: Secondary | ICD-10-CM | POA: Insufficient documentation

## 2018-09-09 DIAGNOSIS — M25522 Pain in left elbow: Secondary | ICD-10-CM | POA: Diagnosis not present

## 2018-09-09 DIAGNOSIS — I110 Hypertensive heart disease with heart failure: Secondary | ICD-10-CM | POA: Insufficient documentation

## 2018-09-09 DIAGNOSIS — S4992XA Unspecified injury of left shoulder and upper arm, initial encounter: Secondary | ICD-10-CM | POA: Diagnosis not present

## 2018-09-09 MED ORDER — ACETAMINOPHEN 500 MG PO TABS
1000.0000 mg | ORAL_TABLET | Freq: Once | ORAL | Status: AC
  Administered 2018-09-09: 1000 mg via ORAL
  Filled 2018-09-09: qty 2

## 2018-09-09 MED ORDER — BACITRACIN-NEOMYCIN-POLYMYXIN 400-5-5000 EX OINT
TOPICAL_OINTMENT | Freq: Once | CUTANEOUS | Status: AC
  Administered 2018-09-09: 1 via TOPICAL
  Filled 2018-09-09: qty 1

## 2018-09-09 NOTE — ED Notes (Signed)
Patient transported to X-ray 

## 2018-09-09 NOTE — ED Notes (Signed)
Patient transported to CT 

## 2018-09-09 NOTE — ED Notes (Signed)
Pt returned from xray

## 2018-09-09 NOTE — Discharge Instructions (Addendum)
The x-ray and the CT scan of your elbow are negative for fracture or dislocation.  There is no signs of major infection.  The x-ray of your shoulder shows arthritis present.  The x-ray of your elbow also shows arthritis present.  Please apply Neosporin to the open skin wound on your elbow.  Use the Ace wrap for support over the next few days.  Please have this rechecked by your primary physician.

## 2018-09-09 NOTE — ED Triage Notes (Signed)
Pt reports that he was accidentally knocked down Monday night and hit a metal door facing. Complaining of left shoulder pain and has an abraison to left elbow. Pt is from [facility]
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
## 2018-09-09 NOTE — Telephone Encounter (Signed)
rx for tylenol order printed, signed and faxed to [facility].

## 2018-09-10 ENCOUNTER — Other Ambulatory Visit: Payer: Self-pay | Admitting: Internal Medicine

## 2018-09-10 DIAGNOSIS — E118 Type 2 diabetes mellitus with unspecified complications: Secondary | ICD-10-CM

## 2018-09-10 DIAGNOSIS — I5022 Chronic systolic (congestive) heart failure: Secondary | ICD-10-CM

## 2018-09-10 MED ORDER — DAPAGLIFLOZIN PROPANEDIOL 10 MG PO TABS: 10.0000 mg | ORAL_TABLET | Freq: Every day | ORAL | 1 refills | Status: DC

## 2018-09-14 DIAGNOSIS — I110 Hypertensive heart disease with heart failure: Secondary | ICD-10-CM | POA: Diagnosis not present

## 2018-09-14 DIAGNOSIS — J9611 Chronic respiratory failure with hypoxia: Secondary | ICD-10-CM | POA: Diagnosis not present

## 2018-09-14 DIAGNOSIS — R2689 Other abnormalities of gait and mobility: Secondary | ICD-10-CM | POA: Diagnosis not present

## 2018-09-14 DIAGNOSIS — M150 Primary generalized (osteo)arthritis: Secondary | ICD-10-CM | POA: Diagnosis not present

## 2018-09-14 DIAGNOSIS — I5022 Chronic systolic (congestive) heart failure: Secondary | ICD-10-CM | POA: Diagnosis not present

## 2018-09-14 DIAGNOSIS — J449 Chronic obstructive pulmonary disease, unspecified: Secondary | ICD-10-CM | POA: Diagnosis not present

## 2018-09-16 MED ORDER — SIMVASTATIN 20 MG PO TABS: 20.0000 mg | ORAL_TABLET | Freq: Every morning | ORAL | 1 refills | Status: DC

## 2018-09-17 ENCOUNTER — Encounter: Payer: Self-pay | Admitting: Internal Medicine

## 2018-09-17 ENCOUNTER — Ambulatory Visit (INDEPENDENT_AMBULATORY_CARE_PROVIDER_SITE_OTHER): Payer: Medicare Other | Admitting: Internal Medicine

## 2018-09-17 ENCOUNTER — Other Ambulatory Visit: Payer: Self-pay

## 2018-09-17 VITALS — BP 130/70 | HR 66 | Temp 97.9°F | Resp 16 | Ht 69.0 in | Wt 244.5 lb

## 2018-09-17 DIAGNOSIS — E118 Type 2 diabetes mellitus with unspecified complications: Secondary | ICD-10-CM

## 2018-09-17 DIAGNOSIS — S40022D Contusion of left upper arm, subsequent encounter: Secondary | ICD-10-CM

## 2018-09-17 MED ORDER — INSULIN DEGLUDEC 200 UNIT/ML ~~LOC~~ SOPN: 40.0000 [IU] | PEN_INJECTOR | Freq: Every day | SUBCUTANEOUS | 2 refills | Status: DC

## 2018-09-17 NOTE — Progress Notes (Signed)
Subjective:  Patient ID: Cody Hale, male    DOB: [birth year]  Age: 68 y.o. MRN: [id number]  CC: Diabetes   HPI Cody Hale presents for f/up - He complains that his blood sugars have been high over the last few months.  His morning blood sugars, fasting, are usually around 200-235.  Over the last 2 months he has had rare blood sugars less than 200.  He has recently added a mealtime quick acting insulin.  He fell recently and injured his left upper extremity.  He was seen in the ED and x-rays were negative for fracture.  He has persistent bruising and swelling but normal range of motion in his shoulder, elbow, and wrist and no joint pain.  Outpatient Medications Prior to Visit  Medication Sig Dispense Refill  . acetaminophen (TYLENOL) 500 MG tablet Take 1 tablet (500 mg total) by mouth every 6 (six) hours as needed for mild pain. 120 tablet 5  . Balsam [place]-Cast[place] Oil (VENELEX) [place] Apply residents buttocks every shift    . cyanocobalamin (,VITAMIN B-12,) 1000 MCG/ML injection INJECT 1081mg (159m INTRAMUSCULARLY EVERY 30 DAYS. 1 mL 0  . dapagliflozin propanediol (FARXIGA) 10 MG TABS tablet Take 10 mg by mouth daily. 90 tablet 1  . fluticasone (FLONASE) 50 MCG/ACT nasal spray Place 2 sprays into both nostrils daily. 16 g 5  . Glycopyrrolate (LONHALA MAGNAIR REFILL KIT) 25 MCG/ML SOLN Inhale 1 puff into the lungs 2 (two) times daily. 60 mL 11  . insulin aspart (NOVOLOG) 100 UNIT/ML FlexPen Inject 5 Units into the skin 3 (three) times daily with meals. 15 mL 5  . Insulin Pen Needle (PEN NEEDLES) 30G X 8 MM MISC Inject 1 pen into the skin daily. 90 each 3  . levocetirizine (XYZAL) 5 MG tablet Take 1 tablet (5 mg total) by mouth every evening. 90 tablet 1  . Magnesium Oxide 400 (240 Mg) MG TABS TAKE 1 TABLET BY MOUTH TWICE DAILY. 60 tablet 3  . metoCLOPramide (REGLAN) 5 MG tablet TAKE 1 TABLET BY MOUTH 3 TIMES DAILY BEFORE MEALS. 90 tablet 3  . nitroGLYCERIN (NITROSTAT) 0.4 MG SL tablet Place 1  tablet (0.4 mg total) under the tongue every 5 (five) minutes as needed for chest pain. Max 3 doses. 10 tablet 0  . nystatin (MYCOSTATIN/NYSTOP) powder Apply topically 3 (three) times daily. APPLY TO GROIN AREA 15 g 0  . ondansetron (ZOFRAN) 4 MG tablet Take 4 mg by mouth every 6 (six) hours as needed for nausea or vomiting.    . pantoprazole (PROTONIX) 40 MG tablet Take 1 tablet (40 mg total) by mouth daily. 90 tablet 1  . polyethylene glycol (MIRALAX) packet Take 17 g by mouth daily. 14 each 0  . potassium chloride SA (K-DUR,KLOR-CON) 20 MEQ tablet TAKE 1 TABLET BY MOUTH TWICE DAILY. 60 tablet 3  . simvastatin (ZOCOR) 20 MG tablet Take 1 tablet (20 mg total) by mouth every morning. 90 tablet 1  . torsemide (DEMADEX) 20 MG tablet Take 40 mg in the morning and 20 mg in the evening. 180 tablet 3  . Vilazodone HCl (VIIBRYD) 40 MG TABS Take 1 tablet (40 mg total) by mouth daily.    . Alveda Reasons0 MG TABS tablet TAKE 1 TABLET BY MOUTH ONCE DAILY. 30 tablet 5  . Glycopyrrolate (LONHALA MAGNAIR STARTER KIT) 25 MCG/ML SOLN Inhale 1 puff into the lungs 2 (two) times daily. 60 mL 11  . Insulin Degludec (TRESIBA FLEXTOUCH) 200 UNIT/ML SOPN Inject 20 Units into  the skin daily. 9 mL 2  . carvedilol (COREG) 3.125 MG tablet Take 1 tablet (3.125 mg total) by mouth 2 (two) times daily. 180 tablet 3  . losartan (COZAAR) 25 MG tablet Take 0.5 tablets (12.5 mg total) by mouth daily. 45 tablet 3   No facility-administered medications prior to visit.     ROS Review of Systems  Constitutional: Negative.  Negative for diaphoresis and fatigue.  HENT: Negative.   Respiratory: Negative for chest tightness and shortness of breath.   Cardiovascular: Negative for chest pain, palpitations and leg swelling.  Gastrointestinal: Negative for abdominal pain, diarrhea and nausea.  Endocrine: Positive for polydipsia, polyphagia and polyuria.  Genitourinary: Negative.  Negative for difficulty urinating.  Musculoskeletal:  Negative for arthralgias and myalgias.  Skin: Negative.   Neurological: Negative.  Negative for dizziness, weakness, light-headedness and numbness.  Hematological: Negative for adenopathy. Does not bruise/bleed easily.  Psychiatric/Behavioral: Negative.     Objective:  BP 130/70 (BP Location: Right Arm, Patient Position: Sitting, Cuff Size: Normal)   Pulse 66   Temp 97.9 F (36.6 C) (Oral)   Resp 16   Ht _0  (1.753 m)   Wt 244 lb 8 oz (110.9 kg)   SpO2 95%   BMI 36.11 kg/m   BP Readings from Last 3 Encounters:  09/17/18 130/70  09/09/18 (!) 116/57  08/26/18 140/62    Wt Readings from Last 3 Encounters:  09/17/18 244 lb 8 oz (110.9 kg)  09/09/18 254 lb (115.2 kg)  08/26/18 247 lb (112 kg)    Physical Exam Vitals signs reviewed.  Constitutional:      Appearance: He is obese. He is not ill-appearing or diaphoretic.  HENT:     Nose: Nose normal.     Mouth/Throat:     Mouth: Mucous membranes are moist.     Pharynx: Oropharynx is clear.  Eyes:     General: No scleral icterus.    Conjunctiva/sclera: Conjunctivae normal.  Neck:     Musculoskeletal: Normal range of motion and neck supple. No muscular tenderness.  Cardiovascular:     Rate and Rhythm: Normal rate and regular rhythm.     Heart sounds: No murmur. No gallop.   Pulmonary:     Effort: Pulmonary effort is normal.     Breath sounds: No stridor. No decreased breath sounds, wheezing, rhonchi or rales.  Abdominal:     General: Bowel sounds are normal.     Palpations: Abdomen is soft. There is no hepatomegaly, splenomegaly or mass.     Tenderness: There is no abdominal tenderness.  Musculoskeletal:     Left shoulder: Normal. He exhibits normal range of motion.     Left elbow: Normal. He exhibits normal range of motion and no deformity.     Left wrist: He exhibits normal range of motion and no swelling.       Arms:     Lab Results  Component Value Date   WBC 5.6 07/22/2018   HGB 12.1 (L) 07/22/2018    HCT 37.1 (L) 07/22/2018   PLT 152.0 07/22/2018   GLUCOSE 209 (H) 07/22/2018   CHOL 90 04/27/2018   TRIG 93.0 04/27/2018   HDL 39.10 04/27/2018   LDLDIRECT 84.2 01/31/2014   LDLCALC 32 04/27/2018   ALT 17 06/09/2018   AST 28 06/09/2018   NA 138 07/22/2018   K 4.5 07/22/2018   CL 96 07/22/2018   CREATININE 1.13 07/22/2018   BUN 17 07/22/2018   CO2 36 (H) 07/22/2018  TSH 1.54 07/22/2018   PSA 0.36 05/30/2014   INR 2.61 02/10/2017   HGBA1C 7.8 (A) 07/22/2018   MICROALBUR 1.7 07/22/2018    Dg Elbow 2 Views Left  Result Date: 09/09/2018 CLINICAL DATA:  Left elbow pain following a fall. EXAM: LEFT ELBOW - 2 VIEW COMPARISON:  None. FINDINGS: Two views of the left elbow demonstrate mild degenerative spur formation. No fracture, dislocation or effusion is seen. There is posterior soft tissue swelling. There are no oblique views to absolutely exclude a fracture. IMPRESSION: 1. Limited examination demonstrating no visible fracture or effusion. 2. Mild degenerative changes. Electronically Signed   By: Claudie Revering M.D.   On: 09/09/2018 19:20   Ct Elbow Left Wo Contrast  Result Date: 09/09/2018 CLINICAL DATA:  Elbow pain after fall 2 days ago. Patient is on Xarelto. EXAM: CT OF THE UPPER LEFT EXTREMITY WITHOUT CONTRAST TECHNIQUE: Multidetector CT imaging of the upper left extremity was performed according to the standard protocol. COMPARISON:  None. FINDINGS: The patient was unable to position adequately and had to be imaged with the arm down by the patient's side. This limits fine bony detail. 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 No definite joint effusion or fracture. There is a linear lucency with sclerotic appearing margins traversing the olecranon, nonspecific possibly a vascular channel or stigmata of old remote trauma. No joint dislocation. 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 Ligaments Suboptimally assessed by CT. Muscles and Tendons No intramuscular hematoma or hemorrhage. No apparent biceps tendon tear with retraction. Soft tissues Soft tissue contusion over the posterior aspect of the elbow. IMPRESSION: Fine bony detail is limited due to suboptimal positioning of the patient's elbow due to the patient's inability to raise his arm above his head for unimpeded imaging. Attenuation from the patient's body limits bony detail. Soft tissue contusion over the posterior aspect of the elbow joint. No acute displaced fracture or joint dislocation. Lucency through the olecranon process with sclerotic appearing margins may reflect a cortical groove, vascular channel or stigmata of old remote trauma. Lack of joint effusion would suggest that this is not an acute fracture. Should symptoms persist, consider alternative imaging such as MRI. Electronically Signed   By: [name] Royalty M.D.   On: 09/09/2018 20:40   Dg Shoulder Left  Result Date: 09/09/2018 CLINICAL DATA:  Left shoulder pain following a fall 2 days ago. EXAM: LEFT SHOULDER - 2+ VIEW COMPARISON:  06/22/2013. FINDINGS: Mild inferior glenohumeral spur formation. No fracture or dislocation. IMPRESSION: No fracture or dislocation.  Mild degenerative changes. Electronically Signed   By: Claudie Revering M.D.   On: 09/09/2018 19:18    Assessment & Plan:   Angus was seen today for diabetes.  Diagnoses and all orders for this visit:  Type II diabetes mellitus with manifestations ([place])- I recommended that he increase his basal insulin dose from 20 units a day up to 40 units a day.  Will continue to mealtime insulin at the current dose.  We will continue the current dose of the SGLT2 inhibitor as well. -     Insulin Degludec (TRESIBA FLEXTOUCH) 200 UNIT/ML SOPN; Inject 40 Units into the skin daily.  Contusion of multiple sites of left upper extremity, subsequent encounter- He will continue to rest, ice, and elevate  his left upper extremity.  No additional complications noted today.   I have changed Cody Hale "David"'s Insulin Degludec. I am also having  him maintain his fluticasone, nitroGLYCERIN, Xarelto, polyethylene glycol, pantoprazole, Vilazodone HCl, nystatin, Venelex, ondansetron, torsemide, Magnesium Oxide, metoCLOPramide, potassium chloride SA, Pen Needles, Glycopyrrolate, levocetirizine, insulin aspart, acetaminophen, cyanocobalamin, dapagliflozin propanediol, and simvastatin.  Meds ordered this encounter  Medications  . Insulin Degludec (TRESIBA FLEXTOUCH) 200 UNIT/ML SOPN    Sig: Inject 40 Units into the skin daily.    Dispense:  9 mL    Refill:  2     Follow-up: Return in about 4 months (around 01/17/2019).  Scarlette Calico, MD

## 2018-09-17 NOTE — Patient Instructions (Signed)
Type 2 Diabetes Mellitus, Diagnosis, Adult Type 2 diabetes (type 2 diabetes mellitus) is a long-term (chronic) disease. In type 2 diabetes, one or both of these problems may be present:  The pancreas does not make enough of a hormone called insulin.  Cells in the body do not respond properly to insulin that the body makes (insulin resistance). Normally, insulin allows blood sugar (glucose) to enter cells in the body. The cells use glucose for energy. Insulin resistance or lack of insulin causes excess glucose to build up in the blood instead of going into cells. As a result, high blood glucose (hyperglycemia) develops. What increases the risk? The following factors may make you more likely to develop type 2 diabetes:  Having a family member with type 2 diabetes.  Being overweight or obese.  Having an inactive (sedentary) lifestyle.  Having been diagnosed with insulin resistance.  Having a history of prediabetes, gestational diabetes, or polycystic ovary syndrome (PCOS).  Being of American-Indian, African-American, Hispanic/Latino, or Asian/Pacific Islander descent. What are the signs or symptoms? In the early stage of this condition, you may not have symptoms. Symptoms develop slowly and may include:  Increased thirst (polydipsia).  Increased hunger(polyphagia).  Increased urination (polyuria).  Increased urination during the night (nocturia).  Unexplained weight loss.  Frequent infections that keep coming back (recurring).  Fatigue.  Weakness.  Vision changes, such as blurry vision.  Cuts or bruises that are slow to heal.  Tingling or numbness in the hands or feet.  Dark patches on the skin (acanthosis nigricans). How is this diagnosed? This condition is diagnosed based on your symptoms, your medical history, a physical exam, and your blood glucose level. Your blood glucose may be checked with one or more of the following blood tests:  A fasting blood glucose (FBG)  test. You will not be allowed to eat (you will fast) for 8 hours or longer before a blood sample is taken.  A random blood glucose test. This test checks blood glucose at any time of day regardless of when you ate.  An A1c (hemoglobin A1c) blood test. This test provides information about blood glucose control over the previous 2-3 months.  An oral glucose tolerance test (OGTT). This test measures your blood glucose at two times: ? After fasting. This is your baseline blood glucose level. ? Two hours after drinking a beverage that contains glucose. You may be diagnosed with type 2 diabetes if:  Your FBG level is 126 mg/dL (7.0 mmol/L) or higher.  Your random blood glucose level is 200 mg/dL (11.1 mmol/L) or higher.  Your A1c level is 6.5% or higher.  Your OGTT result is higher than 200 mg/dL (11.1 mmol/L). These blood tests may be repeated to confirm your diagnosis. How is this treated? Your treatment may be managed by a specialist called an endocrinologist. Type 2 diabetes may be treated by following instructions from your health care provider about:  Making diet and lifestyle changes. This may include: ? Following an individualized nutrition plan that is developed by a diet and nutrition specialist (registered dietitian). ? Exercising regularly. ? Finding ways to manage stress.  Checking your blood glucose level as often as told.  Taking diabetes medicines or insulin daily. This helps to keep your blood glucose levels in the healthy range. ? If you use insulin, you may need to adjust the dosage depending on how physically active you are and what foods you eat. Your health care provider will tell you how to adjust your dosage.    Taking medicines to help prevent complications from diabetes, such as: ? Aspirin. ? Medicine to lower cholesterol. ? Medicine to control blood pressure. Your health care provider will set individualized treatment goals for you. Your goals will be based on  your age, other medical conditions you have, and how you respond to diabetes treatment. Generally, the goal of treatment is to maintain the following blood glucose levels:  Before meals (preprandial): 80-130 mg/dL (4.4-7.2 mmol/L).  After meals (postprandial): below 180 mg/dL (10 mmol/L).  A1c level: less than 7%. Follow these instructions at home: Questions to ask your health care provider  Consider asking the following questions: ? Do I need to meet with a diabetes educator? ? Where can I find a support group for people with diabetes? ? What equipment will I need to manage my diabetes at home? ? What diabetes medicines do I need, and when should I take them? ? How often do I need to check my blood glucose? ? What number can I call if I have questions? ? When is my next appointment? General instructions  Take over-the-counter and prescription medicines only as told by your health care provider.  Keep all follow-up visits as told by your health care provider. This is important.  For more information about diabetes, visit: ? American Diabetes Association (ADA): www.diabetes.org ? American Association of Diabetes Educators (AADE): www.diabeteseducator.org Contact a health care provider if:  Your blood glucose is at or above 240 mg/dL (13.3 mmol/L) for 2 days in a row.  You have been sick or have had a fever for 2 days or longer, and you are not getting better.  You have any of the following problems for more than 6 hours: ? You cannot eat or drink. ? You have nausea and vomiting. ? You have diarrhea. Get help right away if:  Your blood glucose is lower than 54 mg/dL (3.0 mmol/L).  You become confused or you have trouble thinking clearly.  You have difficulty breathing.  You have moderate or large ketone levels in your urine. Summary  Type 2 diabetes (type 2 diabetes mellitus) is a long-term (chronic) disease. In type 2 diabetes, the pancreas does not make enough of a  hormone called insulin, or cells in the body do not respond properly to insulin that the body makes (insulin resistance).  This condition is treated by making diet and lifestyle changes and taking diabetes medicines or insulin.  Your health care provider will set individualized treatment goals for you. Your goals will be based on your age, other medical conditions you have, and how you respond to diabetes treatment.  Keep all follow-up visits as told by your health care provider. This is important. This information is not intended to replace advice given to you by your health care provider. Make sure you discuss any questions you have with your health care provider. Document Released: 06/24/2005 Document Revised: 01/23/2017 Document Reviewed: 07/28/2015 Elsevier Interactive Patient Education  2019 [employer]  

## 2018-09-18 ENCOUNTER — Other Ambulatory Visit: Payer: Self-pay | Admitting: *Deleted

## 2018-09-18 ENCOUNTER — Other Ambulatory Visit: Payer: Self-pay | Admitting: Student

## 2018-09-18 DIAGNOSIS — R2689 Other abnormalities of gait and mobility: Secondary | ICD-10-CM | POA: Diagnosis not present

## 2018-09-18 DIAGNOSIS — I5022 Chronic systolic (congestive) heart failure: Secondary | ICD-10-CM | POA: Diagnosis not present

## 2018-09-18 DIAGNOSIS — M150 Primary generalized (osteo)arthritis: Secondary | ICD-10-CM | POA: Diagnosis not present

## 2018-09-18 DIAGNOSIS — I110 Hypertensive heart disease with heart failure: Secondary | ICD-10-CM | POA: Diagnosis not present

## 2018-09-18 DIAGNOSIS — J449 Chronic obstructive pulmonary disease, unspecified: Secondary | ICD-10-CM | POA: Diagnosis not present

## 2018-09-18 DIAGNOSIS — I5042 Chronic combined systolic (congestive) and diastolic (congestive) heart failure: Secondary | ICD-10-CM

## 2018-09-18 DIAGNOSIS — J9611 Chronic respiratory failure with hypoxia: Secondary | ICD-10-CM | POA: Diagnosis not present

## 2018-09-18 DIAGNOSIS — I10 Essential (primary) hypertension: Secondary | ICD-10-CM

## 2018-09-18 MED ORDER — LOSARTAN POTASSIUM 25 MG PO TABS: 12.5000 mg | ORAL_TABLET | Freq: Every day | ORAL | 3 refills | Status: DC

## 2018-09-20 DIAGNOSIS — S40022A Contusion of left upper arm, initial encounter: Secondary | ICD-10-CM | POA: Insufficient documentation

## 2018-09-22 DIAGNOSIS — R2689 Other abnormalities of gait and mobility: Secondary | ICD-10-CM | POA: Diagnosis not present

## 2018-09-22 DIAGNOSIS — Z7901 Long term (current) use of anticoagulants: Secondary | ICD-10-CM | POA: Diagnosis not present

## 2018-09-22 DIAGNOSIS — M6281 Muscle weakness (generalized): Secondary | ICD-10-CM | POA: Diagnosis not present

## 2018-09-22 DIAGNOSIS — Z96641 Presence of right artificial hip joint: Secondary | ICD-10-CM | POA: Diagnosis not present

## 2018-09-22 DIAGNOSIS — I5022 Chronic systolic (congestive) heart failure: Secondary | ICD-10-CM | POA: Diagnosis not present

## 2018-09-22 DIAGNOSIS — E1143 Type 2 diabetes mellitus with diabetic autonomic (poly)neuropathy: Secondary | ICD-10-CM | POA: Diagnosis not present

## 2018-09-22 DIAGNOSIS — M150 Primary generalized (osteo)arthritis: Secondary | ICD-10-CM | POA: Diagnosis not present

## 2018-09-22 DIAGNOSIS — Z87440 Personal history of urinary (tract) infections: Secondary | ICD-10-CM | POA: Diagnosis not present

## 2018-09-22 DIAGNOSIS — D519 Vitamin B12 deficiency anemia, unspecified: Secondary | ICD-10-CM | POA: Diagnosis not present

## 2018-09-22 DIAGNOSIS — I110 Hypertensive heart disease with heart failure: Secondary | ICD-10-CM | POA: Diagnosis not present

## 2018-09-22 DIAGNOSIS — J449 Chronic obstructive pulmonary disease, unspecified: Secondary | ICD-10-CM | POA: Diagnosis not present

## 2018-09-22 DIAGNOSIS — I2510 Atherosclerotic heart disease of native coronary artery without angina pectoris: Secondary | ICD-10-CM | POA: Diagnosis not present

## 2018-09-22 DIAGNOSIS — Z953 Presence of xenogenic heart valve: Secondary | ICD-10-CM | POA: Diagnosis not present

## 2018-09-22 DIAGNOSIS — F332 Major depressive disorder, recurrent severe without psychotic features: Secondary | ICD-10-CM | POA: Diagnosis not present

## 2018-09-22 DIAGNOSIS — K3184 Gastroparesis: Secondary | ICD-10-CM | POA: Diagnosis not present

## 2018-09-22 DIAGNOSIS — J9611 Chronic respiratory failure with hypoxia: Secondary | ICD-10-CM | POA: Diagnosis not present

## 2018-09-22 DIAGNOSIS — G4733 Obstructive sleep apnea (adult) (pediatric): Secondary | ICD-10-CM | POA: Diagnosis not present

## 2018-09-22 DIAGNOSIS — J988 Other specified respiratory disorders: Secondary | ICD-10-CM | POA: Diagnosis not present

## 2018-09-22 DIAGNOSIS — Z794 Long term (current) use of insulin: Secondary | ICD-10-CM | POA: Diagnosis not present

## 2018-09-22 DIAGNOSIS — Z9981 Dependence on supplemental oxygen: Secondary | ICD-10-CM | POA: Diagnosis not present

## 2018-09-22 DIAGNOSIS — I872 Venous insufficiency (chronic) (peripheral): Secondary | ICD-10-CM | POA: Diagnosis not present

## 2018-09-24 ENCOUNTER — Other Ambulatory Visit: Payer: Self-pay | Admitting: Internal Medicine

## 2018-09-24 ENCOUNTER — Telehealth: Payer: Self-pay | Admitting: Internal Medicine

## 2018-09-24 DIAGNOSIS — R2689 Other abnormalities of gait and mobility: Secondary | ICD-10-CM | POA: Diagnosis not present

## 2018-09-24 DIAGNOSIS — I5022 Chronic systolic (congestive) heart failure: Secondary | ICD-10-CM | POA: Diagnosis not present

## 2018-09-24 DIAGNOSIS — M150 Primary generalized (osteo)arthritis: Secondary | ICD-10-CM | POA: Diagnosis not present

## 2018-09-24 DIAGNOSIS — I110 Hypertensive heart disease with heart failure: Secondary | ICD-10-CM | POA: Diagnosis not present

## 2018-09-24 DIAGNOSIS — J449 Chronic obstructive pulmonary disease, unspecified: Secondary | ICD-10-CM | POA: Diagnosis not present

## 2018-09-24 DIAGNOSIS — J9611 Chronic respiratory failure with hypoxia: Secondary | ICD-10-CM | POA: Diagnosis not present

## 2018-09-24 NOTE — Telephone Encounter (Signed)
Patient had a near trip and fall today and now has a wound on his left lower medial leg  Needs verbals allowing non-stick dressing and wrap with ace bandage due to swelling   Please call back

## 2018-09-24 NOTE — Telephone Encounter (Signed)
Feliberto Harts with [facility] and give verbal okay for non stick and ace wrap.

## 2018-09-28 DIAGNOSIS — M150 Primary generalized (osteo)arthritis: Secondary | ICD-10-CM | POA: Diagnosis not present

## 2018-09-28 DIAGNOSIS — J9611 Chronic respiratory failure with hypoxia: Secondary | ICD-10-CM | POA: Diagnosis not present

## 2018-09-28 DIAGNOSIS — R2689 Other abnormalities of gait and mobility: Secondary | ICD-10-CM | POA: Diagnosis not present

## 2018-09-28 DIAGNOSIS — I110 Hypertensive heart disease with heart failure: Secondary | ICD-10-CM | POA: Diagnosis not present

## 2018-09-28 DIAGNOSIS — I5022 Chronic systolic (congestive) heart failure: Secondary | ICD-10-CM | POA: Diagnosis not present

## 2018-09-28 DIAGNOSIS — J449 Chronic obstructive pulmonary disease, unspecified: Secondary | ICD-10-CM | POA: Diagnosis not present

## 2018-09-29 DIAGNOSIS — R2689 Other abnormalities of gait and mobility: Secondary | ICD-10-CM | POA: Diagnosis not present

## 2018-09-29 DIAGNOSIS — I5022 Chronic systolic (congestive) heart failure: Secondary | ICD-10-CM | POA: Diagnosis not present

## 2018-09-29 DIAGNOSIS — I110 Hypertensive heart disease with heart failure: Secondary | ICD-10-CM | POA: Diagnosis not present

## 2018-09-29 DIAGNOSIS — J449 Chronic obstructive pulmonary disease, unspecified: Secondary | ICD-10-CM | POA: Diagnosis not present

## 2018-09-29 DIAGNOSIS — M150 Primary generalized (osteo)arthritis: Secondary | ICD-10-CM | POA: Diagnosis not present

## 2018-09-29 DIAGNOSIS — J9611 Chronic respiratory failure with hypoxia: Secondary | ICD-10-CM | POA: Diagnosis not present

## 2018-10-01 DIAGNOSIS — J449 Chronic obstructive pulmonary disease, unspecified: Secondary | ICD-10-CM | POA: Diagnosis not present

## 2018-10-01 DIAGNOSIS — I5022 Chronic systolic (congestive) heart failure: Secondary | ICD-10-CM | POA: Diagnosis not present

## 2018-10-01 DIAGNOSIS — R2689 Other abnormalities of gait and mobility: Secondary | ICD-10-CM | POA: Diagnosis not present

## 2018-10-01 DIAGNOSIS — J9611 Chronic respiratory failure with hypoxia: Secondary | ICD-10-CM | POA: Diagnosis not present

## 2018-10-01 DIAGNOSIS — I110 Hypertensive heart disease with heart failure: Secondary | ICD-10-CM | POA: Diagnosis not present

## 2018-10-01 DIAGNOSIS — M150 Primary generalized (osteo)arthritis: Secondary | ICD-10-CM | POA: Diagnosis not present

## 2018-10-02 DIAGNOSIS — M150 Primary generalized (osteo)arthritis: Secondary | ICD-10-CM | POA: Diagnosis not present

## 2018-10-02 DIAGNOSIS — J449 Chronic obstructive pulmonary disease, unspecified: Secondary | ICD-10-CM | POA: Diagnosis not present

## 2018-10-02 DIAGNOSIS — J9611 Chronic respiratory failure with hypoxia: Secondary | ICD-10-CM | POA: Diagnosis not present

## 2018-10-02 DIAGNOSIS — I5022 Chronic systolic (congestive) heart failure: Secondary | ICD-10-CM | POA: Diagnosis not present

## 2018-10-02 DIAGNOSIS — R2689 Other abnormalities of gait and mobility: Secondary | ICD-10-CM | POA: Diagnosis not present

## 2018-10-02 DIAGNOSIS — I110 Hypertensive heart disease with heart failure: Secondary | ICD-10-CM | POA: Diagnosis not present

## 2018-10-05 DIAGNOSIS — J9611 Chronic respiratory failure with hypoxia: Secondary | ICD-10-CM | POA: Diagnosis not present

## 2018-10-05 DIAGNOSIS — I110 Hypertensive heart disease with heart failure: Secondary | ICD-10-CM | POA: Diagnosis not present

## 2018-10-05 DIAGNOSIS — I5022 Chronic systolic (congestive) heart failure: Secondary | ICD-10-CM | POA: Diagnosis not present

## 2018-10-05 DIAGNOSIS — R2689 Other abnormalities of gait and mobility: Secondary | ICD-10-CM | POA: Diagnosis not present

## 2018-10-05 DIAGNOSIS — M150 Primary generalized (osteo)arthritis: Secondary | ICD-10-CM | POA: Diagnosis not present

## 2018-10-05 DIAGNOSIS — J449 Chronic obstructive pulmonary disease, unspecified: Secondary | ICD-10-CM | POA: Diagnosis not present

## 2018-10-06 DIAGNOSIS — I5022 Chronic systolic (congestive) heart failure: Secondary | ICD-10-CM | POA: Diagnosis not present

## 2018-10-06 DIAGNOSIS — I110 Hypertensive heart disease with heart failure: Secondary | ICD-10-CM | POA: Diagnosis not present

## 2018-10-06 DIAGNOSIS — J9611 Chronic respiratory failure with hypoxia: Secondary | ICD-10-CM | POA: Diagnosis not present

## 2018-10-06 DIAGNOSIS — R2689 Other abnormalities of gait and mobility: Secondary | ICD-10-CM | POA: Diagnosis not present

## 2018-10-06 DIAGNOSIS — J449 Chronic obstructive pulmonary disease, unspecified: Secondary | ICD-10-CM | POA: Diagnosis not present

## 2018-10-06 DIAGNOSIS — M150 Primary generalized (osteo)arthritis: Secondary | ICD-10-CM | POA: Diagnosis not present

## 2018-10-08 DIAGNOSIS — J9611 Chronic respiratory failure with hypoxia: Secondary | ICD-10-CM | POA: Diagnosis not present

## 2018-10-08 DIAGNOSIS — I110 Hypertensive heart disease with heart failure: Secondary | ICD-10-CM | POA: Diagnosis not present

## 2018-10-08 DIAGNOSIS — M150 Primary generalized (osteo)arthritis: Secondary | ICD-10-CM | POA: Diagnosis not present

## 2018-10-08 DIAGNOSIS — J449 Chronic obstructive pulmonary disease, unspecified: Secondary | ICD-10-CM | POA: Diagnosis not present

## 2018-10-08 DIAGNOSIS — I5022 Chronic systolic (congestive) heart failure: Secondary | ICD-10-CM | POA: Diagnosis not present

## 2018-10-08 DIAGNOSIS — R2689 Other abnormalities of gait and mobility: Secondary | ICD-10-CM | POA: Diagnosis not present

## 2018-10-09 ENCOUNTER — Other Ambulatory Visit: Payer: Self-pay

## 2018-10-09 DIAGNOSIS — M150 Primary generalized (osteo)arthritis: Secondary | ICD-10-CM | POA: Diagnosis not present

## 2018-10-09 DIAGNOSIS — I5022 Chronic systolic (congestive) heart failure: Secondary | ICD-10-CM | POA: Diagnosis not present

## 2018-10-09 DIAGNOSIS — I5042 Chronic combined systolic (congestive) and diastolic (congestive) heart failure: Secondary | ICD-10-CM

## 2018-10-09 DIAGNOSIS — J449 Chronic obstructive pulmonary disease, unspecified: Secondary | ICD-10-CM | POA: Diagnosis not present

## 2018-10-09 DIAGNOSIS — R2689 Other abnormalities of gait and mobility: Secondary | ICD-10-CM | POA: Diagnosis not present

## 2018-10-09 DIAGNOSIS — J9611 Chronic respiratory failure with hypoxia: Secondary | ICD-10-CM | POA: Diagnosis not present

## 2018-10-09 DIAGNOSIS — I110 Hypertensive heart disease with heart failure: Secondary | ICD-10-CM | POA: Diagnosis not present

## 2018-10-09 DIAGNOSIS — I10 Essential (primary) hypertension: Secondary | ICD-10-CM

## 2018-10-09 MED ORDER — CARVEDILOL 3.125 MG PO TABS: 3.1250 mg | ORAL_TABLET | Freq: Two times a day (BID) | ORAL | 6 refills | Status: DC

## 2018-10-09 MED ORDER — LOSARTAN POTASSIUM 25 MG PO TABS: 12.5000 mg | ORAL_TABLET | Freq: Every day | ORAL | 3 refills | Status: DC

## 2018-10-09 NOTE — Telephone Encounter (Signed)
Refilled losartan and coreg

## 2018-10-12 DIAGNOSIS — M150 Primary generalized (osteo)arthritis: Secondary | ICD-10-CM | POA: Diagnosis not present

## 2018-10-12 DIAGNOSIS — I5022 Chronic systolic (congestive) heart failure: Secondary | ICD-10-CM | POA: Diagnosis not present

## 2018-10-12 DIAGNOSIS — R2689 Other abnormalities of gait and mobility: Secondary | ICD-10-CM | POA: Diagnosis not present

## 2018-10-12 DIAGNOSIS — J449 Chronic obstructive pulmonary disease, unspecified: Secondary | ICD-10-CM | POA: Diagnosis not present

## 2018-10-12 DIAGNOSIS — I110 Hypertensive heart disease with heart failure: Secondary | ICD-10-CM | POA: Diagnosis not present

## 2018-10-12 DIAGNOSIS — J9611 Chronic respiratory failure with hypoxia: Secondary | ICD-10-CM | POA: Diagnosis not present

## 2018-10-13 DIAGNOSIS — I110 Hypertensive heart disease with heart failure: Secondary | ICD-10-CM | POA: Diagnosis not present

## 2018-10-13 DIAGNOSIS — M150 Primary generalized (osteo)arthritis: Secondary | ICD-10-CM | POA: Diagnosis not present

## 2018-10-13 DIAGNOSIS — R2689 Other abnormalities of gait and mobility: Secondary | ICD-10-CM | POA: Diagnosis not present

## 2018-10-13 DIAGNOSIS — I5022 Chronic systolic (congestive) heart failure: Secondary | ICD-10-CM | POA: Diagnosis not present

## 2018-10-13 DIAGNOSIS — J9611 Chronic respiratory failure with hypoxia: Secondary | ICD-10-CM | POA: Diagnosis not present

## 2018-10-13 DIAGNOSIS — J449 Chronic obstructive pulmonary disease, unspecified: Secondary | ICD-10-CM | POA: Diagnosis not present

## 2018-10-14 DIAGNOSIS — I110 Hypertensive heart disease with heart failure: Secondary | ICD-10-CM | POA: Diagnosis not present

## 2018-10-14 DIAGNOSIS — M150 Primary generalized (osteo)arthritis: Secondary | ICD-10-CM | POA: Diagnosis not present

## 2018-10-14 DIAGNOSIS — I5022 Chronic systolic (congestive) heart failure: Secondary | ICD-10-CM | POA: Diagnosis not present

## 2018-10-14 DIAGNOSIS — J449 Chronic obstructive pulmonary disease, unspecified: Secondary | ICD-10-CM | POA: Diagnosis not present

## 2018-10-14 DIAGNOSIS — R2689 Other abnormalities of gait and mobility: Secondary | ICD-10-CM | POA: Diagnosis not present

## 2018-10-14 DIAGNOSIS — J9611 Chronic respiratory failure with hypoxia: Secondary | ICD-10-CM | POA: Diagnosis not present

## 2018-10-15 DIAGNOSIS — J449 Chronic obstructive pulmonary disease, unspecified: Secondary | ICD-10-CM | POA: Diagnosis not present

## 2018-10-15 DIAGNOSIS — I110 Hypertensive heart disease with heart failure: Secondary | ICD-10-CM | POA: Diagnosis not present

## 2018-10-15 DIAGNOSIS — M150 Primary generalized (osteo)arthritis: Secondary | ICD-10-CM | POA: Diagnosis not present

## 2018-10-15 DIAGNOSIS — I5022 Chronic systolic (congestive) heart failure: Secondary | ICD-10-CM | POA: Diagnosis not present

## 2018-10-15 DIAGNOSIS — R2689 Other abnormalities of gait and mobility: Secondary | ICD-10-CM | POA: Diagnosis not present

## 2018-10-15 DIAGNOSIS — J9611 Chronic respiratory failure with hypoxia: Secondary | ICD-10-CM | POA: Diagnosis not present

## 2018-10-19 ENCOUNTER — Other Ambulatory Visit: Payer: Self-pay | Admitting: Internal Medicine

## 2018-10-19 DIAGNOSIS — J9611 Chronic respiratory failure with hypoxia: Secondary | ICD-10-CM | POA: Diagnosis not present

## 2018-10-19 DIAGNOSIS — R2689 Other abnormalities of gait and mobility: Secondary | ICD-10-CM | POA: Diagnosis not present

## 2018-10-19 DIAGNOSIS — J449 Chronic obstructive pulmonary disease, unspecified: Secondary | ICD-10-CM | POA: Diagnosis not present

## 2018-10-19 DIAGNOSIS — I5022 Chronic systolic (congestive) heart failure: Secondary | ICD-10-CM | POA: Diagnosis not present

## 2018-10-19 DIAGNOSIS — M150 Primary generalized (osteo)arthritis: Secondary | ICD-10-CM | POA: Diagnosis not present

## 2018-10-19 DIAGNOSIS — I110 Hypertensive heart disease with heart failure: Secondary | ICD-10-CM | POA: Diagnosis not present

## 2018-10-20 DIAGNOSIS — I5022 Chronic systolic (congestive) heart failure: Secondary | ICD-10-CM | POA: Diagnosis not present

## 2018-10-20 DIAGNOSIS — R2689 Other abnormalities of gait and mobility: Secondary | ICD-10-CM | POA: Diagnosis not present

## 2018-10-20 DIAGNOSIS — M150 Primary generalized (osteo)arthritis: Secondary | ICD-10-CM | POA: Diagnosis not present

## 2018-10-20 DIAGNOSIS — I110 Hypertensive heart disease with heart failure: Secondary | ICD-10-CM | POA: Diagnosis not present

## 2018-10-20 DIAGNOSIS — J9611 Chronic respiratory failure with hypoxia: Secondary | ICD-10-CM | POA: Diagnosis not present

## 2018-10-20 DIAGNOSIS — J449 Chronic obstructive pulmonary disease, unspecified: Secondary | ICD-10-CM | POA: Diagnosis not present

## 2018-10-21 ENCOUNTER — Other Ambulatory Visit: Payer: Self-pay | Admitting: *Deleted

## 2018-10-21 ENCOUNTER — Encounter: Payer: Self-pay | Admitting: *Deleted

## 2018-10-21 ENCOUNTER — Ambulatory Visit: Payer: Self-pay | Admitting: Internal Medicine

## 2018-10-21 NOTE — Patient Outreach (Signed)
[facility]) Care Management  10/21/2018  Dao Strite [birth year] [id number]  CSW made an initial attempt to try and contact patient today at [facility], Assisted Living Facility where patient currently resides, to perform the initial phone assessment, as well as assess and assist with social work needs and services, without success.  A HIPAA compliant message was left for patient with the receptionist, as CSW was told that patient's attending nurse at the facility was unavailable at the time of CSW's call, and that the residents at the facility do not have phones in their rooms.  CSW is currently awaiting a return call.  CSW will make a second outreach attempt within the next 3-4 business days, if a return call is not received from patient in the meantime.  CSW will also mail an Outreach Letter to [facility], in attention to patient, requesting that patient contact CSW if patient is interested in receiving social work services through CSW with [facility].  Danford Bad, BSW, MSW, LCSW  Licensed Restaurant manager, [employer] [facility]  Mailing [place] [address] [place], [place] [address] Physical Address-[address], [place], [place] [address] Toll Free Main # [phone] Fax # [phone] Cell # [phone]  Office # [phone] [email or web address]

## 2018-10-22 ENCOUNTER — Other Ambulatory Visit: Payer: Self-pay | Admitting: Internal Medicine

## 2018-10-26 ENCOUNTER — Ambulatory Visit: Payer: Self-pay | Admitting: Internal Medicine

## 2018-10-26 DIAGNOSIS — Z96641 Presence of right artificial hip joint: Secondary | ICD-10-CM | POA: Diagnosis not present

## 2018-10-26 DIAGNOSIS — R2689 Other abnormalities of gait and mobility: Secondary | ICD-10-CM | POA: Diagnosis not present

## 2018-10-28 ENCOUNTER — Other Ambulatory Visit: Payer: Self-pay | Admitting: *Deleted

## 2018-10-28 DIAGNOSIS — I509 Heart failure, unspecified: Secondary | ICD-10-CM | POA: Diagnosis not present

## 2018-10-28 DIAGNOSIS — R2689 Other abnormalities of gait and mobility: Secondary | ICD-10-CM | POA: Diagnosis not present

## 2018-10-28 NOTE — Patient Outreach (Signed)
[facility]) Care Management  10/28/2018  Cody Hale Dec 26, 1950 [id number]   CSW made a second attempt to try and contact patient today at [facility], Assisted Living Facility where patient currently resides, to perform phone assessment, as well as assess and assist with social work needs and services, without success.  A HIPAA compliant message was left for patient with the receptionist.  CSW continues to await a return call.  CSW will make a third and final outreach attempt within the next 3-4 business days, if a return call is not received from patient in the meantime.  CSW will then proceed with case closure if a return call is not received from patient with a total of 10 business days, as required number of phone attempts will have been made and outreach letter mailed.   Danford Bad, BSW, MSW, LCSW  Licensed Restaurant manager, [employer] [facility]  Mailing [place] [address] [place], [place] [address] Physical Address-[address], [place], [place] [address] Toll Free Main # [phone] Fax # [phone] Cell # [phone]  Office # [phone] [email or web address]

## 2018-11-02 DIAGNOSIS — I509 Heart failure, unspecified: Secondary | ICD-10-CM | POA: Diagnosis not present

## 2018-11-02 DIAGNOSIS — R2689 Other abnormalities of gait and mobility: Secondary | ICD-10-CM | POA: Diagnosis not present

## 2018-11-03 ENCOUNTER — Other Ambulatory Visit: Payer: Self-pay | Admitting: *Deleted

## 2018-11-03 NOTE — Patient Outreach (Signed)
[facility]) Care Management  11/03/2018  Cody Hale 17-Mar-1951 [id number]    CSW made a third and final attempt to try and contact patient today at [facility], Assisted Living Facility where patient currently resides, to perform phone assessment, as well as assess and assist with social work needs and services, without success.  A HIPAA compliant message was left for patient on voicemail.  CSW is currently awaiting a return call.  CSW will proceed with case closure in two business days, if a return call is not received from patient in the meantime, as required number of phone attempts have been made and an outreach letter was mailed to patient's home allowing 10 business days for a response.  Danford Bad, BSW, MSW, LCSW  Licensed Restaurant manager, [employer] [facility]  Mailing [place] [address] [place], [place] [address] Physical Address[address], [name], [place] [address] Toll Free Main # [phone] Fax # [phone] Cell # [phone]  Office # [phone] [email or web address]

## 2018-11-04 DIAGNOSIS — R2689 Other abnormalities of gait and mobility: Secondary | ICD-10-CM | POA: Diagnosis not present

## 2018-11-04 DIAGNOSIS — I509 Heart failure, unspecified: Secondary | ICD-10-CM | POA: Diagnosis not present

## 2018-11-05 ENCOUNTER — Telehealth (INDEPENDENT_AMBULATORY_CARE_PROVIDER_SITE_OTHER): Payer: Medicare Other | Admitting: Cardiovascular Disease

## 2018-11-05 ENCOUNTER — Encounter: Payer: Self-pay | Admitting: *Deleted

## 2018-11-05 ENCOUNTER — Encounter: Payer: Self-pay | Admitting: Cardiovascular Disease

## 2018-11-05 ENCOUNTER — Other Ambulatory Visit: Payer: Self-pay | Admitting: *Deleted

## 2018-11-05 VITALS — BP 145/80 | HR 68 | Ht 69.0 in | Wt 256.0 lb

## 2018-11-05 DIAGNOSIS — I5042 Chronic combined systolic (congestive) and diastolic (congestive) heart failure: Secondary | ICD-10-CM | POA: Diagnosis not present

## 2018-11-05 DIAGNOSIS — I428 Other cardiomyopathies: Secondary | ICD-10-CM

## 2018-11-05 DIAGNOSIS — I4821 Permanent atrial fibrillation: Secondary | ICD-10-CM

## 2018-11-05 DIAGNOSIS — I10 Essential (primary) hypertension: Secondary | ICD-10-CM

## 2018-11-05 DIAGNOSIS — E785 Hyperlipidemia, unspecified: Secondary | ICD-10-CM

## 2018-11-05 DIAGNOSIS — Z952 Presence of prosthetic heart valve: Secondary | ICD-10-CM

## 2018-11-05 NOTE — Patient Instructions (Signed)
Medication Instructions:  Continue all current medications.  Labwork: none  Testing/Procedures: none  Follow-Up: 3 months   Any Other Special Instructions Will Be Listed Below (If Applicable).  If you need a refill on your cardiac medications before your next appointment, please call your pharmacy.  

## 2018-11-05 NOTE — Patient Outreach (Signed)
[facility]) Care Management  11/05/2018  Cody Hale [birth year] [id number]   CSW will perform a case closure on patient, due to inability to establish initial phone contact with patient, despite required number of phone attempts made and outreach letter mailed to patient's home, allowing 10 business days for a response.  CSW will fax an update to patient's Primary Care Physician, Dr. Sanda Linger to ensure that they are aware of CSW's involvement with patient's plan of care.  Danford Bad, BSW, MSW, LCSW  Licensed Restaurant manager, [employer] [facility]  Mailing [place] [address], [place], [place] [address] Physical Address-[address], [place], [place] [address] Toll Free Main # [phone] Fax # [phone] Cell # [phone]  Office # [phone] [email or web address]
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
## 2018-11-10 DIAGNOSIS — R2689 Other abnormalities of gait and mobility: Secondary | ICD-10-CM | POA: Diagnosis not present

## 2018-11-10 DIAGNOSIS — I509 Heart failure, unspecified: Secondary | ICD-10-CM | POA: Diagnosis not present

## 2018-11-12 ENCOUNTER — Other Ambulatory Visit: Payer: Self-pay | Admitting: Internal Medicine

## 2018-11-12 DIAGNOSIS — I509 Heart failure, unspecified: Secondary | ICD-10-CM | POA: Diagnosis not present

## 2018-11-12 DIAGNOSIS — E118 Type 2 diabetes mellitus with unspecified complications: Secondary | ICD-10-CM

## 2018-11-12 DIAGNOSIS — R2689 Other abnormalities of gait and mobility: Secondary | ICD-10-CM | POA: Diagnosis not present

## 2018-11-16 ENCOUNTER — Other Ambulatory Visit: Payer: Self-pay | Admitting: Internal Medicine

## 2018-11-16 DIAGNOSIS — J449 Chronic obstructive pulmonary disease, unspecified: Secondary | ICD-10-CM

## 2018-11-16 MED ORDER — GLYCOPYRROLATE 25 MCG/ML IN SOLN: 1.0000 | Freq: Two times a day (BID) | RESPIRATORY_TRACT | 1 refills | Status: DC

## 2018-11-17 DIAGNOSIS — R2689 Other abnormalities of gait and mobility: Secondary | ICD-10-CM | POA: Diagnosis not present

## 2018-11-17 DIAGNOSIS — I509 Heart failure, unspecified: Secondary | ICD-10-CM | POA: Diagnosis not present

## 2018-11-19 DIAGNOSIS — I509 Heart failure, unspecified: Secondary | ICD-10-CM | POA: Diagnosis not present

## 2018-11-19 DIAGNOSIS — R2689 Other abnormalities of gait and mobility: Secondary | ICD-10-CM | POA: Diagnosis not present

## 2018-11-24 DIAGNOSIS — I509 Heart failure, unspecified: Secondary | ICD-10-CM | POA: Diagnosis not present

## 2018-11-24 DIAGNOSIS — R2689 Other abnormalities of gait and mobility: Secondary | ICD-10-CM | POA: Diagnosis not present

## 2018-11-26 DIAGNOSIS — R2689 Other abnormalities of gait and mobility: Secondary | ICD-10-CM | POA: Diagnosis not present

## 2018-11-26 DIAGNOSIS — I509 Heart failure, unspecified: Secondary | ICD-10-CM | POA: Diagnosis not present

## 2018-12-01 ENCOUNTER — Ambulatory Visit: Payer: Self-pay | Admitting: Internal Medicine

## 2018-12-01 DIAGNOSIS — I509 Heart failure, unspecified: Secondary | ICD-10-CM | POA: Diagnosis not present

## 2018-12-01 DIAGNOSIS — R2689 Other abnormalities of gait and mobility: Secondary | ICD-10-CM | POA: Diagnosis not present

## 2018-12-03 DIAGNOSIS — R2689 Other abnormalities of gait and mobility: Secondary | ICD-10-CM | POA: Diagnosis not present

## 2018-12-03 DIAGNOSIS — I509 Heart failure, unspecified: Secondary | ICD-10-CM | POA: Diagnosis not present

## 2018-12-07 ENCOUNTER — Other Ambulatory Visit: Payer: Self-pay | Admitting: Internal Medicine

## 2018-12-07 DIAGNOSIS — I509 Heart failure, unspecified: Secondary | ICD-10-CM | POA: Diagnosis not present

## 2018-12-07 DIAGNOSIS — R2689 Other abnormalities of gait and mobility: Secondary | ICD-10-CM | POA: Diagnosis not present

## 2018-12-09 DIAGNOSIS — R2689 Other abnormalities of gait and mobility: Secondary | ICD-10-CM | POA: Diagnosis not present

## 2018-12-09 DIAGNOSIS — I509 Heart failure, unspecified: Secondary | ICD-10-CM | POA: Diagnosis not present

## 2018-12-15 ENCOUNTER — Other Ambulatory Visit: Payer: Self-pay | Admitting: *Deleted

## 2018-12-15 DIAGNOSIS — I5042 Chronic combined systolic (congestive) and diastolic (congestive) heart failure: Secondary | ICD-10-CM

## 2018-12-15 DIAGNOSIS — I10 Essential (primary) hypertension: Secondary | ICD-10-CM

## 2018-12-15 MED ORDER — LOSARTAN POTASSIUM 25 MG PO TABS: 12.5000 mg | ORAL_TABLET | Freq: Every day | ORAL | 3 refills | Status: DC

## 2018-12-16 ENCOUNTER — Other Ambulatory Visit: Payer: Self-pay | Admitting: Internal Medicine

## 2018-12-16 DIAGNOSIS — E118 Type 2 diabetes mellitus with unspecified complications: Secondary | ICD-10-CM

## 2018-12-16 DIAGNOSIS — I5022 Chronic systolic (congestive) heart failure: Secondary | ICD-10-CM

## 2018-12-23 ENCOUNTER — Other Ambulatory Visit: Payer: Self-pay | Admitting: Internal Medicine

## 2018-12-23 DIAGNOSIS — E785 Hyperlipidemia, unspecified: Secondary | ICD-10-CM

## 2018-12-23 MED ORDER — SIMVASTATIN 20 MG PO TABS: 20.0000 mg | ORAL_TABLET | Freq: Every morning | ORAL | 1 refills | Status: DC

## 2018-12-31 ENCOUNTER — Encounter: Payer: Self-pay | Admitting: Internal Medicine

## 2018-12-31 ENCOUNTER — Other Ambulatory Visit (INDEPENDENT_AMBULATORY_CARE_PROVIDER_SITE_OTHER): Payer: Medicare Other

## 2018-12-31 ENCOUNTER — Ambulatory Visit (INDEPENDENT_AMBULATORY_CARE_PROVIDER_SITE_OTHER): Payer: Medicare Other | Admitting: Internal Medicine

## 2018-12-31 ENCOUNTER — Other Ambulatory Visit: Payer: Self-pay

## 2018-12-31 VITALS — BP 142/80 | HR 72 | Temp 98.5°F | Ht 69.0 in | Wt 273.0 lb

## 2018-12-31 DIAGNOSIS — J301 Allergic rhinitis due to pollen: Secondary | ICD-10-CM

## 2018-12-31 DIAGNOSIS — I10 Essential (primary) hypertension: Secondary | ICD-10-CM | POA: Diagnosis not present

## 2018-12-31 DIAGNOSIS — I5022 Chronic systolic (congestive) heart failure: Secondary | ICD-10-CM

## 2018-12-31 DIAGNOSIS — K2100 Gastro-esophageal reflux disease with esophagitis, without bleeding: Secondary | ICD-10-CM

## 2018-12-31 DIAGNOSIS — D510 Vitamin B12 deficiency anemia due to intrinsic factor deficiency: Secondary | ICD-10-CM | POA: Diagnosis not present

## 2018-12-31 DIAGNOSIS — J449 Chronic obstructive pulmonary disease, unspecified: Secondary | ICD-10-CM

## 2018-12-31 DIAGNOSIS — E118 Type 2 diabetes mellitus with unspecified complications: Secondary | ICD-10-CM

## 2018-12-31 DIAGNOSIS — E785 Hyperlipidemia, unspecified: Secondary | ICD-10-CM | POA: Diagnosis not present

## 2018-12-31 DIAGNOSIS — I482 Chronic atrial fibrillation, unspecified: Secondary | ICD-10-CM | POA: Diagnosis not present

## 2018-12-31 LAB — BASIC METABOLIC PANEL
BUN: 32 mg/dL — ABNORMAL HIGH (ref 6–23)
CO2: 33 mEq/L — ABNORMAL HIGH (ref 19–32)
Calcium: 9.2 mg/dL (ref 8.4–10.5)
Chloride: 99 mEq/L (ref 96–112)
Creatinine, Ser: 1.32 mg/dL (ref 0.40–1.50)
GFR: 53.91 mL/min — ABNORMAL LOW (ref >60.00)
Glucose, Bld: 159 mg/dL — ABNORMAL HIGH (ref 70–99)
Potassium: 4.4 mEq/L (ref 3.5–5.1)
Sodium: 140 mEq/L (ref 135–145)

## 2018-12-31 LAB — CBC WITH DIFFERENTIAL/PLATELET
Basophils Absolute: 0.1 10*3/uL (ref 0.0–0.1)
Basophils Relative: 1.1 % (ref 0.0–3.0)
Eosinophils Absolute: 0.2 10*3/uL (ref 0.0–0.7)
Eosinophils Relative: 3.4 % (ref 0.0–5.0)
HCT: 41.1 % (ref 39.0–52.0)
Hemoglobin: 13.4 g/dL (ref 13.0–17.0)
Lymphocytes Relative: 13.9 % (ref 12.0–46.0)
Lymphs Abs: 0.9 10*3/uL (ref 0.7–4.0)
MCHC: 32.6 g/dL (ref 30.0–36.0)
MCV: 86.9 fl (ref 78.0–100.0)
Monocytes Absolute: 0.8 10*3/uL (ref 0.1–1.0)
Monocytes Relative: 13.3 % — ABNORMAL HIGH (ref 3.0–12.0)
Neutro Abs: 4.3 10*3/uL (ref 1.4–7.7)
Neutrophils Relative %: 68.3 % (ref 43.0–77.0)
Platelets: 132 10*3/uL — ABNORMAL LOW (ref 150.0–400.0)
RBC: 4.73 Mil/uL (ref 4.22–5.81)
RDW: 16.4 % — ABNORMAL HIGH (ref 11.5–15.5)
WBC: 6.3 10*3/uL (ref 4.0–10.5)

## 2018-12-31 LAB — HEMOGLOBIN A1C: Hgb A1c MFr Bld: 8.3 % — ABNORMAL HIGH (ref 4.6–6.5)

## 2018-12-31 MED ORDER — TRESIBA FLEXTOUCH 200 UNIT/ML ~~LOC~~ SOPN: 50.0000 [IU] | PEN_INJECTOR | Freq: Every day | SUBCUTANEOUS | 1 refills | Status: DC

## 2018-12-31 MED ORDER — FLUTICASONE PROPIONATE 50 MCG/ACT NA SUSP: 2.0000 | Freq: Every day | NASAL | 5 refills | Status: DC

## 2018-12-31 MED ORDER — MAGNESIUM OXIDE -MG SUPPLEMENT 400 (240 MG) MG PO TABS: 1.0000 | ORAL_TABLET | Freq: Two times a day (BID) | ORAL | 1 refills | Status: DC

## 2018-12-31 MED ORDER — FARXIGA 10 MG PO TABS: 10.0000 mg | ORAL_TABLET | Freq: Every day | ORAL | 1 refills | Status: DC

## 2018-12-31 MED ORDER — POTASSIUM CHLORIDE CRYS ER 20 MEQ PO TBCR: 20.0000 meq | EXTENDED_RELEASE_TABLET | Freq: Two times a day (BID) | ORAL | 1 refills | Status: DC

## 2018-12-31 MED ORDER — PANTOPRAZOLE SODIUM 40 MG PO TBEC: 40.0000 mg | DELAYED_RELEASE_TABLET | Freq: Every day | ORAL | 1 refills | Status: DC

## 2018-12-31 MED ORDER — METOCLOPRAMIDE HCL 5 MG PO TABS: ORAL_TABLET | ORAL | 3 refills | Status: DC

## 2018-12-31 MED ORDER — INSULIN ASPART 100 UNIT/ML FLEXPEN: 5.0000 [IU] | PEN_INJECTOR | Freq: Three times a day (TID) | SUBCUTANEOUS | 5 refills | Status: DC

## 2018-12-31 MED ORDER — TRULICITY 1.5 MG/0.5ML ~~LOC~~ SOAJ: 1.0000 | SUBCUTANEOUS | 1 refills | Status: DC

## 2018-12-31 MED ORDER — LEVOCETIRIZINE DIHYDROCHLORIDE 5 MG PO TABS: 5.0000 mg | ORAL_TABLET | Freq: Every evening | ORAL | 1 refills | Status: DC

## 2018-12-31 MED ORDER — LONHALA MAGNAIR REFILL KIT 25 MCG/ML IN SOLN: 1.0000 | Freq: Two times a day (BID) | RESPIRATORY_TRACT | 1 refills | Status: DC

## 2018-12-31 MED ORDER — SIMVASTATIN 20 MG PO TABS: 20.0000 mg | ORAL_TABLET | Freq: Every morning | ORAL | 1 refills | Status: DC

## 2018-12-31 MED ORDER — RIVAROXABAN 20 MG PO TABS: 20.0000 mg | ORAL_TABLET | Freq: Every day | ORAL | 1 refills | Status: DC

## 2018-12-31 NOTE — Patient Instructions (Signed)
Type 2 Diabetes Mellitus, Diagnosis, Adult Type 2 diabetes (type 2 diabetes mellitus) is a long-term (chronic) disease. In type 2 diabetes, one or both of these problems may be present:  The pancreas does not make enough of a hormone called insulin.  Cells in the body do not respond properly to insulin that the body makes (insulin resistance). Normally, insulin allows blood sugar (glucose) to enter cells in the body. The cells use glucose for energy. Insulin resistance or lack of insulin causes excess glucose to build up in the blood instead of going into cells. As a result, high blood glucose (hyperglycemia) develops. What increases the risk? The following factors may make you more likely to develop type 2 diabetes:  Having a family member with type 2 diabetes.  Being overweight or obese.  Having an inactive (sedentary) lifestyle.  Having been diagnosed with insulin resistance.  Having a history of prediabetes, gestational diabetes, or polycystic ovary syndrome (PCOS).  Being of American-Indian, African-American, Hispanic/Latino, or Asian/Pacific Islander descent. What are the signs or symptoms? In the early stage of this condition, you may not have symptoms. Symptoms develop slowly and may include:  Increased thirst (polydipsia).  Increased hunger(polyphagia).  Increased urination (polyuria).  Increased urination during the night (nocturia).  Unexplained weight loss.  Frequent infections that keep coming back (recurring).  Fatigue.  Weakness.  Vision changes, such as blurry vision.  Cuts or bruises that are slow to heal.  Tingling or numbness in the hands or feet.  Dark patches on the skin (acanthosis nigricans). How is this diagnosed? This condition is diagnosed based on your symptoms, your medical history, a physical exam, and your blood glucose level. Your blood glucose may be checked with one or more of the following blood tests:  A fasting blood glucose (FBG)  test. You will not be allowed to eat (you will fast) for 8 hours or longer before a blood sample is taken.  A random blood glucose test. This test checks blood glucose at any time of day regardless of when you ate.  An A1c (hemoglobin A1c) blood test. This test provides information about blood glucose control over the previous 2-3 months.  An oral glucose tolerance test (OGTT). This test measures your blood glucose at two times: ? After fasting. This is your baseline blood glucose level. ? Two hours after drinking a beverage that contains glucose. You may be diagnosed with type 2 diabetes if:  Your FBG level is 126 mg/dL (7.0 mmol/L) or higher.  Your random blood glucose level is 200 mg/dL (11.1 mmol/L) or higher.  Your A1c level is 6.5% or higher.  Your OGTT result is higher than 200 mg/dL (11.1 mmol/L). These blood tests may be repeated to confirm your diagnosis. How is this treated? Your treatment may be managed by a specialist called an endocrinologist. Type 2 diabetes may be treated by following instructions from your health care provider about:  Making diet and lifestyle changes. This may include: ? Following an individualized nutrition plan that is developed by a diet and nutrition specialist (registered dietitian). ? Exercising regularly. ? Finding ways to manage stress.  Checking your blood glucose level as often as told.  Taking diabetes medicines or insulin daily. This helps to keep your blood glucose levels in the healthy range. ? If you use insulin, you may need to adjust the dosage depending on how physically active you are and what foods you eat. Your health care provider will tell you how to adjust your dosage.    Taking medicines to help prevent complications from diabetes, such as: ? Aspirin. ? Medicine to lower cholesterol. ? Medicine to control blood pressure. Your health care provider will set individualized treatment goals for you. Your goals will be based on  your age, other medical conditions you have, and how you respond to diabetes treatment. Generally, the goal of treatment is to maintain the following blood glucose levels:  Before meals (preprandial): 80-130 mg/dL (4.4-7.2 mmol/L).  After meals (postprandial): below 180 mg/dL (10 mmol/L).  A1c level: less than 7%. Follow these instructions at home: Questions to ask your health care provider  Consider asking the following questions: ? Do I need to meet with a diabetes educator? ? Where can I find a support group for people with diabetes? ? What equipment will I need to manage my diabetes at home? ? What diabetes medicines do I need, and when should I take them? ? How often do I need to check my blood glucose? ? What number can I call if I have questions? ? When is my next appointment? General instructions  Take over-the-counter and prescription medicines only as told by your health care provider.  Keep all follow-up visits as told by your health care provider. This is important.  For more information about diabetes, visit: ? American Diabetes Association (ADA): www.diabetes.org ? American Association of Diabetes Educators (AADE): www.diabeteseducator.org Contact a health care provider if:  Your blood glucose is at or above 240 mg/dL (13.3 mmol/L) for 2 days in a row.  You have been sick or have had a fever for 2 days or longer, and you are not getting better.  You have any of the following problems for more than 6 hours: ? You cannot eat or drink. ? You have nausea and vomiting. ? You have diarrhea. Get help right away if:  Your blood glucose is lower than 54 mg/dL (3.0 mmol/L).  You become confused or you have trouble thinking clearly.  You have difficulty breathing.  You have moderate or large ketone levels in your urine. Summary  Type 2 diabetes (type 2 diabetes mellitus) is a long-term (chronic) disease. In type 2 diabetes, the pancreas does not make enough of a  hormone called insulin, or cells in the body do not respond properly to insulin that the body makes (insulin resistance).  This condition is treated by making diet and lifestyle changes and taking diabetes medicines or insulin.  Your health care provider will set individualized treatment goals for you. Your goals will be based on your age, other medical conditions you have, and how you respond to diabetes treatment.  Keep all follow-up visits as told by your health care provider. This is important. This information is not intended to replace advice given to you by your health care provider. Make sure you discuss any questions you have with your health care provider. Document Released: 06/24/2005 Document Revised: 01/23/2017 Document Reviewed: 07/28/2015 Elsevier Interactive Patient Education  2019 [employer]  

## 2018-12-31 NOTE — Progress Notes (Signed)
Subjective:  Patient ID: Cody Hale, male    DOB: 29-Jul-1950  Age: 68 y.o. MRN: [id number]  CC: Hypertension and Diabetes   HPI Cody Hale presents for f/up - He complains of weight gain and poor glycemic control.  His blood sugar log is rarely below 200 and after meals it spikes up into the mid 200's.  He also complains of polys.  Outpatient Medications Prior to Visit  Medication Sig Dispense Refill  . acetaminophen (TYLENOL) 500 MG tablet Take 1 tablet (500 mg total) by mouth every 6 (six) hours as needed for mild pain. 120 tablet 5  . carvedilol (COREG) 3.125 MG tablet Take 1 tablet (3.125 mg total) by mouth 2 (two) times daily. 60 tablet 6  . cyanocobalamin (,VITAMIN B-12,) 1000 MCG/ML injection INJECT 1049mg (149m INTRAMUSCULARLY EVERY 30 DAYS. 1 mL 5  . losartan (COZAAR) 25 MG tablet Take 0.5 tablets (12.5 mg total) by mouth daily. 45 tablet 3  . Multiple Vitamin (MULTIVITAMIN) tablet Take 1 tablet by mouth daily.    . Marland KitchenOVOFINE AUTOCOVER 30G X 8 MM MISC USE AS DIRECTED FOR ONCE DAILY INSULIN ADMINISTRATION. 100 each 1  . polyethylene glycol (MIRALAX) packet Take 17 g by mouth daily. 14 each 0  . torsemide (DEMADEX) 20 MG tablet Take 40 mg in the morning and 20 mg in the evening. (Patient taking differently: Take 40 mg by mouth 2 (two) times daily. ) 180 tablet 3  . Vilazodone HCl (VIIBRYD) 40 MG TABS Take 1 tablet (40 mg total) by mouth daily.    . Marland KitchenARXIGA 10 MG TABS tablet TAKE 1 TABLET BY MOUTH ONCE DAILY. 90 tablet 1  . fluticasone (FLONASE) 50 MCG/ACT nasal spray Place 2 sprays into both nostrils daily. 16 g 5  . Glycopyrrolate (LONHALA MAGNAIR REFILL KIT) 25 MCG/ML SOLN Inhale 1 puff into the lungs 2 (two) times daily. 180 mL 1  . insulin aspart (NOVOLOG) 100 UNIT/ML FlexPen Inject 5 Units into the skin 3 (three) times daily with meals. 15 mL 5  . levocetirizine (XYZAL) 5 MG tablet Take 1 tablet (5 mg total) by mouth every evening. 90 tablet 1  . Magnesium Oxide 400 (240  Mg) MG TABS TAKE 1 TABLET BY MOUTH TWICE DAILY. 60 tablet 3  . metoCLOPramide (REGLAN) 5 MG tablet TAKE 1 TABLET BY MOUTH 3 TIMES DAILY BEFORE MEALS. 90 tablet 3  . pantoprazole (PROTONIX) 40 MG tablet Take 1 tablet (40 mg total) by mouth daily. 90 tablet 1  . potassium chloride SA (K-DUR,KLOR-CON) 20 MEQ tablet TAKE 1 TABLET BY MOUTH TWICE DAILY. 60 tablet 3  . simvastatin (ZOCOR) 20 MG tablet Take 1 tablet (20 mg total) by mouth every morning. 90 tablet 1  . TRESIBA FLEXTOUCH 200 UNIT/ML SOPN INJECT 40 units SUBCUTANEOUSLY ONCE DAILY. 9 mL 1  . XARELTO 20 MG TABS tablet TAKE 1 TABLET BY MOUTH ONCE DAILY. 30 tablet 5  . nitroGLYCERIN (NITROSTAT) 0.4 MG SL tablet Place 1 tablet (0.4 mg total) under the tongue every 5 (five) minutes as needed for chest pain. Max 3 doses. (Patient not taking: Reported on 12/31/2018) 10 tablet 0   No facility-administered medications prior to visit.     ROS Review of Systems  Constitutional: Positive for unexpected weight change. Negative for diaphoresis and fatigue.  HENT: Negative.   Eyes: Negative for visual disturbance.  Respiratory: Negative.  Negative for cough, chest tightness, shortness of breath and wheezing.   Cardiovascular: Negative for chest pain, palpitations and leg swelling.  Gastrointestinal: Negative for abdominal pain, constipation, diarrhea, nausea and vomiting.  Endocrine: Positive for polydipsia, polyphagia and polyuria.  Genitourinary: Negative.  Negative for difficulty urinating.  Musculoskeletal: Positive for arthralgias. Negative for myalgias.  Skin: Negative.  Negative for color change and pallor.  Neurological: Negative.  Negative for dizziness, weakness and light-headedness.  Hematological: Negative for adenopathy. Does not bruise/bleed easily.  Psychiatric/Behavioral: Negative.     Objective:  BP (!) 142/80 (BP Location: Left Arm, Patient Position: Sitting, Cuff Size: Large)   Pulse 72   Temp 98.5 F (36.9 C) (Oral)   Ht 5'  9" (1.753 m)   Wt 273 lb (123.8 kg)   SpO2 96%   BMI 40.32 kg/m   BP Readings from Last 3 Encounters:  12/31/18 (!) 142/80  11/05/18 (!) 145/80  09/17/18 130/70    Wt Readings from Last 3 Encounters:  12/31/18 273 lb (123.8 kg)  11/05/18 256 lb (116.1 kg)  09/17/18 244 lb 8 oz (110.9 kg)    Physical Exam Constitutional:      Appearance: He is obese. He is not ill-appearing or diaphoretic.  HENT:     Nose: Nose normal. No congestion.     Mouth/Throat:     Mouth: Mucous membranes are moist.  Eyes:     General: No scleral icterus.    Conjunctiva/sclera: Conjunctivae normal.  Neck:     Musculoskeletal: Normal range of motion. No neck rigidity or muscular tenderness.  Cardiovascular:     Rate and Rhythm: Normal rate and regular rhythm.     Heart sounds: No murmur. No gallop.   Pulmonary:     Breath sounds: No stridor. No wheezing, rhonchi or rales.  Abdominal:     General: Abdomen is protuberant.     Palpations: Abdomen is soft. There is no hepatomegaly, splenomegaly or mass.     Tenderness: There is no abdominal tenderness.  Musculoskeletal: Normal range of motion.        General: No swelling.     Right lower leg: No edema.     Left lower leg: No edema.  Lymphadenopathy:     Cervical: No cervical adenopathy.  Skin:    General: Skin is warm and dry.  Neurological:     General: No focal deficit present.     Mental Status: He is alert and oriented to person, place, and time. Mental status is at baseline.  Psychiatric:        Mood and Affect: Mood normal.        Behavior: Behavior normal.     Lab Results  Component Value Date   WBC 6.3 12/31/2018   HGB 13.4 12/31/2018   HCT 41.1 12/31/2018   PLT 132.0 (L) 12/31/2018   GLUCOSE 159 (H) 12/31/2018   CHOL 90 04/27/2018   TRIG 93.0 04/27/2018   HDL 39.10 04/27/2018   LDLDIRECT 84.2 01/31/2014   LDLCALC 32 04/27/2018   ALT 17 06/09/2018   AST 28 06/09/2018   NA 140 12/31/2018   K 4.4 12/31/2018   CL 99  12/31/2018   CREATININE 1.32 12/31/2018   BUN 32 (H) 12/31/2018   CO2 33 (H) 12/31/2018   TSH 1.54 07/22/2018   PSA 0.36 05/30/2014   INR 2.61 02/10/2017   HGBA1C 8.3 (H) 12/31/2018   MICROALBUR 1.7 07/22/2018    Dg Elbow 2 Views Left  Result Date: 09/09/2018 CLINICAL DATA:  Left elbow pain following a fall. EXAM: LEFT ELBOW - 2 VIEW COMPARISON:  None. FINDINGS: Two views of the  left elbow demonstrate mild degenerative spur formation. No fracture, dislocation or effusion is seen. There is posterior soft tissue swelling. There are no oblique views to absolutely exclude a fracture. IMPRESSION: 1. Limited examination demonstrating no visible fracture or effusion. 2. Mild degenerative changes. Electronically Signed   By: Claudie Revering M.D.   On: 09/09/2018 19:20   Ct Elbow Left Wo Contrast  Result Date: 09/09/2018 CLINICAL DATA:  Elbow pain after fall 2 days ago. Patient is on Xarelto. EXAM: CT OF THE UPPER LEFT EXTREMITY WITHOUT CONTRAST TECHNIQUE: Multidetector CT imaging of the upper left extremity was performed according to the standard protocol. COMPARISON:  None. FINDINGS: The patient was unable to position adequately and had to be imaged with the arm down by the patient's side. This limits fine bony detail. 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 No definite joint effusion or fracture. There is a linear lucency with sclerotic appearing margins traversing the olecranon, nonspecific possibly a vascular channel or stigmata of old remote trauma. No joint dislocation. Stigmata of bilateral epicondylitis with ossifications off the epicondyles bilaterally. Ligaments Suboptimally assessed by CT. Muscles and Tendons No intramuscular hematoma or hemorrhage. No apparent biceps tendon tear with retraction. Soft tissues Soft tissue contusion over the posterior aspect of the elbow. IMPRESSION: Fine bony detail is limited due to  suboptimal positioning of the patient's elbow due to the patient's inability to raise his arm above his head for unimpeded imaging. Attenuation from the patient's body limits bony detail. Soft tissue contusion over the posterior aspect of the elbow joint. No acute displaced fracture or joint dislocation. Lucency through the olecranon process with sclerotic appearing margins may reflect a cortical groove, vascular channel or stigmata of old remote trauma. Lack of joint effusion would suggest that this is not an acute fracture. Should symptoms persist, consider alternative imaging such as MRI. Electronically Signed   By: [name] Royalty M.D.   On: 09/09/2018 20:40   Dg Shoulder Left  Result Date: 09/09/2018 CLINICAL DATA:  Left shoulder pain following a fall 2 days ago. EXAM: LEFT SHOULDER - 2+ VIEW COMPARISON:  06/22/2013. FINDINGS: Mild inferior glenohumeral spur formation. No fracture or dislocation. IMPRESSION: No fracture or dislocation.  Mild degenerative changes. Electronically Signed   By: Claudie Revering M.D.   On: 09/09/2018 19:18    Assessment & Plan:   Cody Hale was seen today for hypertension and diabetes.  Diagnoses and all orders for this visit:  Type II diabetes mellitus with manifestations ([place])- His A1c is at 8.3%.  His blood sugars are not adequately well controlled.  I have asked him to add a GLP-1 agonist to his current regimen. -     HM Diabetes Foot Exam -     Dulaglutide (TRULICITY) 1.5 SF/6.8LE SOPN; Inject 1 Act into the skin once a week. -     Basic metabolic panel; Future -     Hemoglobin A1c; Future -     dapagliflozin propanediol (FARXIGA) 10 MG TABS tablet; Take 10 mg by mouth daily. -     insulin aspart (NOVOLOG) 100 UNIT/ML FlexPen; Inject 5 Units into the skin 3 (three) times daily with meals. -     Insulin Degludec (TRESIBA FLEXTOUCH) 200 UNIT/ML SOPN; Inject 50 Units into the skin daily.  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 -     CBC with Differential/Platelet; Future  Chronic systolic CHF (congestive  heart failure) ([place])- His fluid status is normal. -     dapagliflozin propanediol (FARXIGA) 10 MG TABS tablet; Take 10 mg by mouth daily.  Allergic rhinitis due to pollen -     fluticasone (FLONASE) 50 MCG/ACT nasal spray; Place 2 sprays into both nostrils daily.  COPD (chronic obstructive pulmonary disease) with chronic bronchitis ([facility]) -     Glycopyrrolate (LONHALA MAGNAIR REFILL KIT) 25 MCG/ML SOLN; Inhale 1 puff into the lungs 2 (two) times daily.  Acute nonseasonal allergic rhinitis due to pollen -     levocetirizine (XYZAL) 5 MG tablet; Take 1 tablet (5 mg total) by mouth every evening.  GERD with esophagitis -     pantoprazole (PROTONIX) 40 MG tablet; Take 1 tablet (40 mg total) by mouth daily.  Hyperlipidemia with target LDL less than 70 -     simvastatin (ZOCOR) 20 MG tablet; Take 1 tablet (20 mg total) by mouth every morning.  Chronic atrial fibrillation- He has good rate and rhythm control.  Will continue anticoagulation with the DOAC. -     rivaroxaban (XARELTO) 20 MG TABS tablet; Take 1 tablet (20 mg total) by mouth daily.  Essential hypertension, malignant- His blood pressure is adequately well controlled. -     Magnesium Oxide 400 (240 Mg) MG TABS; Take 1 tablet (400 mg total) by mouth 2 (two) times daily.  Other orders -     metoCLOPramide (REGLAN) 5 MG tablet; TAKE 1 TABLET BY MOUTH 3 TIMES DAILY BEFORE MEALS. -     potassium chloride SA (K-DUR) 20 MEQ tablet; Take 1 tablet (20 mEq total) by mouth 2 (two) times daily.   I have changed Cody Hale "David"'s Xarelto to rivaroxaban. I have also changed his [place], Magnesium Oxide, potassium chloride SA, and Tresiba FlexTouch. I am also having him start on Trulicity. Additionally, I am having him maintain his nitroGLYCERIN, polyethylene glycol, Vilazodone HCl, torsemide, acetaminophen, carvedilol, cyanocobalamin,  multivitamin, NovoFine Autocover, losartan, fluticasone, [place] Magnair Refill Kit, insulin aspart, levocetirizine, metoCLOPramide, pantoprazole, and simvastatin.  Meds ordered this encounter  Medications  . Dulaglutide (TRULICITY) 1.5 WU/9.8JX SOPN    Sig: Inject 1 Act into the skin once a week.    Dispense:  12 pen    Refill:  1  . dapagliflozin propanediol (FARXIGA) 10 MG TABS tablet    Sig: Take 10 mg by mouth daily.    Dispense:  90 tablet    Refill:  1  . fluticasone (FLONASE) 50 MCG/ACT nasal spray    Sig: Place 2 sprays into both nostrils daily.    Dispense:  16 g    Refill:  5  . Glycopyrrolate (LONHALA MAGNAIR REFILL KIT) 25 MCG/ML SOLN    Sig: Inhale 1 puff into the lungs 2 (two) times daily.    Dispense:  180 mL    Refill:  1  . insulin aspart (NOVOLOG) 100 UNIT/ML FlexPen    Sig: Inject 5 Units into the skin 3 (three) times daily with meals.    Dispense:  15 mL    Refill:  5  . levocetirizine (XYZAL) 5 MG tablet    Sig: Take 1 tablet (5 mg total) by mouth every evening.    Dispense:  90 tablet    Refill:  1  . Magnesium Oxide 400 (240 Mg) MG TABS    Sig: Take 1 tablet (400 mg total) by mouth 2 (two) times daily.    Dispense:  90 tablet    Refill:  1  .  metoCLOPramide (REGLAN) 5 MG tablet    Sig: TAKE 1 TABLET BY MOUTH 3 TIMES DAILY BEFORE MEALS.    Dispense:  90 tablet    Refill:  3  . pantoprazole (PROTONIX) 40 MG tablet    Sig: Take 1 tablet (40 mg total) by mouth daily.    Dispense:  90 tablet    Refill:  1  . potassium chloride SA (K-DUR) 20 MEQ tablet    Sig: Take 1 tablet (20 mEq total) by mouth 2 (two) times daily.    Dispense:  180 tablet    Refill:  1  . simvastatin (ZOCOR) 20 MG tablet    Sig: Take 1 tablet (20 mg total) by mouth every morning.    Dispense:  90 tablet    Refill:  1  . Insulin Degludec (TRESIBA FLEXTOUCH) 200 UNIT/ML SOPN    Sig: Inject 50 Units into the skin daily.    Dispense:  9 mL    Refill:  1  . rivaroxaban (XARELTO) 20  MG TABS tablet    Sig: Take 1 tablet (20 mg total) by mouth daily.    Dispense:  90 tablet    Refill:  1     Follow-up: Return in about 6 months (around 07/02/2019).  Scarlette Calico, MD

## 2019-01-12 ENCOUNTER — Other Ambulatory Visit: Payer: Self-pay

## 2019-01-12 DIAGNOSIS — I5042 Chronic combined systolic (congestive) and diastolic (congestive) heart failure: Secondary | ICD-10-CM

## 2019-01-12 DIAGNOSIS — I10 Essential (primary) hypertension: Secondary | ICD-10-CM

## 2019-01-12 MED ORDER — LOSARTAN POTASSIUM 25 MG PO TABS: 12.5000 mg | ORAL_TABLET | Freq: Every day | ORAL | 3 refills | Status: DC

## 2019-01-12 NOTE — Telephone Encounter (Signed)
90 day  Refill losartan done

## 2019-01-13 ENCOUNTER — Ambulatory Visit (INDEPENDENT_AMBULATORY_CARE_PROVIDER_SITE_OTHER): Payer: Medicare Other | Admitting: Internal Medicine

## 2019-01-13 ENCOUNTER — Ambulatory Visit (INDEPENDENT_AMBULATORY_CARE_PROVIDER_SITE_OTHER)
Admission: RE | Admit: 2019-01-13 | Discharge: 2019-01-13 | Disposition: A | Discharge status: Home / Self Care | Payer: Medicare Other | Source: Ambulatory Visit | Attending: Internal Medicine | Admitting: Internal Medicine

## 2019-01-13 ENCOUNTER — Other Ambulatory Visit: Payer: Self-pay

## 2019-01-13 ENCOUNTER — Encounter: Payer: Self-pay | Admitting: Internal Medicine

## 2019-01-13 ENCOUNTER — Ambulatory Visit: Payer: Self-pay | Admitting: *Deleted

## 2019-01-13 VITALS — BP 112/62 | HR 46 | Temp 98.4°F | Resp 20 | Ht 69.0 in | Wt 268.0 lb

## 2019-01-13 DIAGNOSIS — S8002XA Contusion of left knee, initial encounter: Secondary | ICD-10-CM | POA: Diagnosis not present

## 2019-01-13 DIAGNOSIS — R51 Headache: Secondary | ICD-10-CM | POA: Diagnosis not present

## 2019-01-13 DIAGNOSIS — F332 Major depressive disorder, recurrent severe without psychotic features: Secondary | ICD-10-CM

## 2019-01-13 DIAGNOSIS — J449 Chronic obstructive pulmonary disease, unspecified: Secondary | ICD-10-CM

## 2019-01-13 DIAGNOSIS — Z23 Encounter for immunization: Secondary | ICD-10-CM | POA: Diagnosis not present

## 2019-01-13 DIAGNOSIS — J441 Chronic obstructive pulmonary disease with (acute) exacerbation: Secondary | ICD-10-CM | POA: Diagnosis not present

## 2019-01-13 DIAGNOSIS — M25562 Pain in left knee: Secondary | ICD-10-CM | POA: Diagnosis not present

## 2019-01-13 DIAGNOSIS — S0101XA Laceration without foreign body of scalp, initial encounter: Secondary | ICD-10-CM | POA: Diagnosis not present

## 2019-01-13 DIAGNOSIS — S298XXA Other specified injuries of thorax, initial encounter: Secondary | ICD-10-CM

## 2019-01-13 DIAGNOSIS — S2242XA Multiple fractures of ribs, left side, initial encounter for closed fracture: Secondary | ICD-10-CM

## 2019-01-13 DIAGNOSIS — J9611 Chronic respiratory failure with hypoxia: Secondary | ICD-10-CM | POA: Diagnosis not present

## 2019-01-13 DIAGNOSIS — S0993XA Unspecified injury of face, initial encounter: Secondary | ICD-10-CM

## 2019-01-13 DIAGNOSIS — S8992XA Unspecified injury of left lower leg, initial encounter: Secondary | ICD-10-CM | POA: Diagnosis not present

## 2019-01-13 DIAGNOSIS — R079 Chest pain, unspecified: Secondary | ICD-10-CM | POA: Diagnosis not present

## 2019-01-13 MED ORDER — METHYLPREDNISOLONE ACETATE 80 MG/ML IJ SUSP
120.0000 mg | Freq: Once | INTRAMUSCULAR | Status: AC
  Administered 2019-01-13: 120 mg via INTRAMUSCULAR

## 2019-01-13 MED ORDER — OXYCODONE HCL 5 MG PO TABS: 5.0000 mg | ORAL_TABLET | Freq: Four times a day (QID) | ORAL | 0 refills | Status: AC | PRN

## 2019-01-13 MED ORDER — TRELEGY ELLIPTA 100-62.5-25 MCG/INH IN AEPB: 1.0000 | INHALATION_SPRAY | Freq: Every day | RESPIRATORY_TRACT | 1 refills | Status: DC

## 2019-01-13 NOTE — Telephone Encounter (Signed)
Summary: Cold sx   Pt's son would like to know if pt needs an appt or if he can be given OTC treatment for sx:   Congestion/cough when lying down for a few days. Pt has not been exposed to COVID that they know of. Pt has also reported not using his CPAP mask for the last couple of nights to son.   Please call [name] to advise: [phone]      Patient has had chest congestion and cough about 1 week- patient has just moved into an apartment. As long as patient is sitting- he is fine- his symptoms are worse at night. Patient has not been wearing his CPAP at night recently.Sister reports patient is wheezing and she can hear his breathing as he is sitting next to her. (She also reports patient has recently had a fall- he tripped and has multiple bruises)  Reason for Disposition . Wheezing is present  Answer Assessment - Initial Assessment Questions 1. ONSET: "When did the cough begin?"      1 week ago 2. SEVERITY: "How bad is the cough today?"      Can hear congestion in upper chest/throat- sounds loose and coughing a lot of phlegm  3. RESPIRATORY DISTRESS: "Describe your breathing."      Sister thinks she is hearing wheezing with breathing 4. FEVER: "Do you have a fever?" If so, ask: "What is your temperature, how was it measured, and when did it start?"     no 5. SPUTUM: "Describe the color of your sputum" (clear, white, yellow, green)     Coughing up -yellow 6. HEMOPTYSIS: "Are you coughing up any blood?" If so ask: "How much?" (flecks, streaks, tablespoons, etc.)     no 7. CARDIAC HISTORY: "Do you have any history of heart disease?" (e.g., heart attack, congestive heart failure)      Yes- heart attack 8. LUNG HISTORY: "Do you have any history of lung disease?"  (e.g., pulmonary embolus, asthma, emphysema)     COPD 9. PE RISK FACTORS: "Do you have a history of blood clots?" (or: recent major surgery, recent prolonged travel, bedridden)     Patient is using blood thinner 10. OTHER  SYMPTOMS: "Do you have any other symptoms?" (e.g., runny nose, wheezing, chest pain)       Wheezing 11. PREGNANCY: "Is there any chance you are pregnant?" "When was your last menstrual period?"       n/a 12. TRAVEL: "Have you traveled out of the country in the last month?" (e.g., travel history, exposures)       No travel  Protocols used: [place]

## 2019-01-13 NOTE — Telephone Encounter (Signed)
Appointment has been made for 7/8. °

## 2019-01-13 NOTE — Progress Notes (Signed)
Subjective:  Patient ID: Cody Hale, male    DOB: 06-04-1951  Age: 68 y.o. MRN: [id number]  CC: Cough and COPD   HPI Cody Hale presents for f/up -1 week ago he started experiencing worsening symptoms of COPD.  He is not using any inhalers.  He complains of nonproductive cough, shortness of breath, and wheezing.  4 days ago he sustained a fall in his home.  He injured the left side of his face.  He did not lose consciousness and denies headache.  He has had no nausea or vomiting.  He also injured his left lateral rib cage, his left forearm, and his left knee.  The left forearm does not bother him.  Over the knee he has an abrasion and mild discomfort.  The most significant pain is in the left rib cage.  Outpatient Medications Prior to Visit  Medication Sig Dispense Refill  . acetaminophen (TYLENOL) 500 MG tablet Take 1 tablet (500 mg total) by mouth every 6 (six) hours as needed for mild pain. 120 tablet 5  . carvedilol (COREG) 3.125 MG tablet Take 1 tablet (3.125 mg total) by mouth 2 (two) times daily. 60 tablet 6  . cyanocobalamin (,VITAMIN B-12,) 1000 MCG/ML injection INJECT 1072mg (19m INTRAMUSCULARLY EVERY 30 DAYS. 1 mL 5  . dapagliflozin propanediol (FARXIGA) 10 MG TABS tablet Take 10 mg by mouth daily. 90 tablet 1  . Dulaglutide (TRULICITY) 1.5 MGYT/0.1SWOPN Inject 1 Act into the skin once a week. 12 pen 1  . fluticasone (FLONASE) 50 MCG/ACT nasal spray Place 2 sprays into both nostrils daily. 16 g 5  . Glycopyrrolate (LONHALA MAGNAIR REFILL KIT) 25 MCG/ML SOLN Inhale 1 puff into the lungs 2 (two) times daily. 180 mL 1  . insulin aspart (NOVOLOG) 100 UNIT/ML FlexPen Inject 5 Units into the skin 3 (three) times daily with meals. 15 mL 5  . Insulin Degludec (TRESIBA FLEXTOUCH) 200 UNIT/ML SOPN Inject 50 Units into the skin daily. 9 mL 1  . levocetirizine (XYZAL) 5 MG tablet Take 1 tablet (5 mg total) by mouth every evening. 90 tablet 1  . losartan (COZAAR) 25 MG  tablet Take 0.5 tablets (12.5 mg total) by mouth daily. 45 tablet 3  . Magnesium Oxide 400 (240 Mg) MG TABS Take 1 tablet (400 mg total) by mouth 2 (two) times daily. 90 tablet 1  . metoCLOPramide (REGLAN) 5 MG tablet TAKE 1 TABLET BY MOUTH 3 TIMES DAILY BEFORE MEALS. 90 tablet 3  . Multiple Vitamin (MULTIVITAMIN) tablet Take 1 tablet by mouth daily.    . nitroGLYCERIN (NITROSTAT) 0.4 MG SL tablet Place 1 tablet (0.4 mg total) under the tongue every 5 (five) minutes as needed for chest pain. Max 3 doses. 10 tablet 0  . NOVOFINE AUTOCOVER 30G X 8 MM MISC USE AS DIRECTED FOR ONCE DAILY INSULIN ADMINISTRATION. 100 each 1  . pantoprazole (PROTONIX) 40 MG tablet Take 1 tablet (40 mg total) by mouth daily. 90 tablet 1  . polyethylene glycol (MIRALAX) packet Take 17 g by mouth daily. 14 each 0  . potassium chloride SA (K-DUR) 20 MEQ tablet Take 1 tablet (20 mEq total) by mouth 2 (two) times daily. 180 tablet 1  . rivaroxaban (XARELTO) 20 MG TABS tablet Take 1 tablet (20 mg total) by mouth daily. 90 tablet 1  . simvastatin (ZOCOR) 20 MG tablet Take 1 tablet (20 mg total) by mouth every morning. 90 tablet 1  . torsemide (  DEMADEX) 20 MG tablet Take 40 mg in the morning and 20 mg in the evening. (Patient taking differently: Take 40 mg by mouth 2 (two) times daily. ) 180 tablet 3  . Vilazodone HCl (VIIBRYD) 40 MG TABS Take 1 tablet (40 mg total) by mouth daily.     No facility-administered medications prior to visit.     ROS Review of Systems  Constitutional: Negative for chills, diaphoresis, fatigue and fever.  HENT: Positive for facial swelling. Negative for nosebleeds, rhinorrhea, sinus pressure and trouble swallowing.   Eyes: Negative for photophobia, pain and visual disturbance.  Respiratory: Positive for cough, shortness of breath and wheezing. Negative for choking, chest tightness and stridor.   Cardiovascular: Positive for chest pain. Negative for palpitations and leg swelling.  Gastrointestinal:  Negative for abdominal pain, constipation, diarrhea, nausea and vomiting.  Endocrine: Negative.   Genitourinary: Negative.  Negative for difficulty urinating.  Musculoskeletal: Positive for arthralgias. Negative for back pain, myalgias and neck pain.  Skin: Positive for color change and wound.  Neurological: Negative for dizziness, seizures, syncope, weakness, light-headedness and headaches.  Hematological: Negative for adenopathy. Does not bruise/bleed easily.  Psychiatric/Behavioral: Negative.     Objective:  BP 112/62 (BP Location: Left Arm, Patient Position: Sitting, Cuff Size: Normal)   Pulse (!) 46   Temp 98.4 F (36.9 C) (Oral)   Resp 20   Ht _0  (1.753 m)   Wt 268 lb (121.6 kg)   SpO2 93%   BMI 39.58 kg/m   BP Readings from Last 3 Encounters:  01/13/19 112/62  12/31/18 (!) 142/80  11/05/18 (!) 145/80    Wt Readings from Last 3 Encounters:  01/13/19 268 lb (121.6 kg)  12/31/18 273 lb (123.8 kg)  11/05/18 256 lb (116.1 kg)    Physical Exam Constitutional:      General: He is not in acute distress.    Appearance: He is obese. He is not ill-appearing, toxic-appearing or diaphoretic.  HENT:     Head: Raccoon eyes, abrasion, contusion and laceration present. No Battle's sign.      Nose: Nose normal. No congestion or rhinorrhea.     Mouth/Throat:     Mouth: Mucous membranes are moist.     Pharynx: No oropharyngeal exudate.  Eyes:     General: No scleral icterus.    Conjunctiva/sclera: Conjunctivae normal.  Neck:     Musculoskeletal: Normal range of motion. No neck rigidity.  Cardiovascular:     Rate and Rhythm: Normal rate and regular rhythm.     Heart sounds: No murmur. No friction rub. No gallop.   Pulmonary:     Effort: No tachypnea, accessory muscle usage or respiratory distress.     Breath sounds: No stridor. Examination of the right-middle field reveals rhonchi. Examination of the left-middle field reveals rhonchi. Examination of the right-lower field  reveals wheezing and rhonchi. Examination of the left-lower field reveals wheezing and rhonchi. Wheezing and rhonchi present. No decreased breath sounds or rales.  Chest:     Chest wall: Tenderness present. No mass, deformity, swelling or edema.    Abdominal:     General: Abdomen is protuberant. Bowel sounds are normal. There is no distension.     Palpations: There is no hepatomegaly or splenomegaly.     Tenderness: There is no abdominal tenderness.  Musculoskeletal:        General: Swelling and deformity present.     Right lower leg: No edema.       Legs:  Skin:  General: Skin is warm.  Neurological:     General: No focal deficit present.     Mental Status: He is alert and oriented to person, place, and time. Mental status is at baseline.  Psychiatric:        Mood and Affect: Mood normal.        Behavior: Behavior normal.     Lab Results  Component Value Date   WBC 6.3 12/31/2018   HGB 13.4 12/31/2018   HCT 41.1 12/31/2018   PLT 132.0 (L) 12/31/2018   GLUCOSE 159 (H) 12/31/2018   CHOL 90 04/27/2018   TRIG 93.0 04/27/2018   HDL 39.10 04/27/2018   LDLDIRECT 84.2 01/31/2014   LDLCALC 32 04/27/2018   ALT 17 06/09/2018   AST 28 06/09/2018   NA 140 12/31/2018   K 4.4 12/31/2018   CL 99 12/31/2018   CREATININE 1.32 12/31/2018   BUN 32 (H) 12/31/2018   CO2 33 (H) 12/31/2018   TSH 1.54 07/22/2018   PSA 0.36 05/30/2014   INR 2.61 02/10/2017   HGBA1C 8.3 (H) 12/31/2018   MICROALBUR 1.7 07/22/2018    Dg Facial Bones Complete  Result Date: 01/13/2019 CLINICAL DATA:  Status post fall with left facial pain. EXAM: FACIAL BONES COMPLETE 3+V COMPARISON:  None. FINDINGS: There is no evidence of fracture or dislocation. No orbital emphysema or sinus air-fluid levels are seen. IMPRESSION: No definite acute fracture or dislocation noted. Electronically Signed   By: Abelardo Diesel M.D.   On: 01/13/2019 14:59   Dg Chest 2 View  Result Date: 01/13/2019 CLINICAL DATA:  Status post  fall 5 days ago with left chest pain. EXAM: CHEST - 2 VIEW COMPARISON:  August 05, 2018 FINDINGS: The heart size and mediastinal contours are stable. No focal infiltrate, pulmonary edema, or pleural effusion is identified. There is no pneumothorax. There are possible deformities of several lateral left mid ribs fractures are not excluded. IMPRESSION: Possible deformities of several lateral left mid ribs, fracture is not excluded. Further evaluation with a left rib series is recommended. Lungs are clear.  There is no pneumothorax. Electronically Signed   By: Abelardo Diesel M.D.   On: 01/13/2019 14:54   Dg Knee Complete 4 Views Left  Result Date: 01/13/2019 CLINICAL DATA:  Status post fall five days ago with left knee pain. EXAM: LEFT KNEE - COMPLETE 4+ VIEW COMPARISON:  None. FINDINGS: No evidence of fracture, dislocation, or joint effusion. Narrow femoral tibial joint space with osteophyte formation are noted. Soft tissues are unremarkable. IMPRESSION: Osteoarthritic changes of left knee. Electronically Signed   By: Abelardo Diesel M.D.   On: 01/13/2019 14:57    Assessment & Plan:   Gryffin was seen today for cough and copd.  Diagnoses and all orders for this visit:  Blunt chest trauma, initial encounter-plain films look suspicious for multiple rib fractures. -     DG Chest 2 View; Future  Facial injury, initial encounter-films are negative for fracture.  Will treat for contusion.  His wounds are healing.  Topical antibiotic ointment was applied. -     DG Facial Bones Complete; Future  Traumatic ecchymosis of left knee, initial encounter- Plain films are negative for fracture.  Triple antibiotic ointment has been applied. -     DG Knee Complete 4 Views Left; Future  COPD with acute exacerbation ([place])- He is having an acute exacerbation so I gave him injection of Depo-Medrol.  I have asked him to also start using a triple inhaler. -  Fluticasone-Umeclidin-Vilant (TRELEGY ELLIPTA) 100-62.5-25  MCG/INH AEPB; Inhale 1 puff into the lungs daily.  Closed fracture of multiple ribs of left side, initial encounter -     oxyCODONE (OXY IR/ROXICODONE) 5 MG immediate release tablet; Take 1 tablet (5 mg total) by mouth every 6 (six) hours as needed for up to 7 days for severe pain.  COPD (chronic obstructive pulmonary disease) with chronic bronchitis ([place]) -     Fluticasone-Umeclidin-Vilant (TRELEGY ELLIPTA) 100-62.5-25 MCG/INH AEPB; Inhale 1 puff into the lungs daily.   I am having Emmanuelle Wronski "Shanon Brow" start on oxyCODONE and Trelegy Ellipta. I am also having him maintain his nitroGLYCERIN, polyethylene glycol, Vilazodone HCl, torsemide, acetaminophen, carvedilol, cyanocobalamin, multivitamin, NovoFine Autocover, Trulicity, Farxiga, fluticasone, [place] Magnair Refill Kit, insulin aspart, levocetirizine, Magnesium Oxide, metoCLOPramide, pantoprazole, potassium chloride SA, simvastatin, Tresiba FlexTouch, rivaroxaban, and losartan.  Meds ordered this encounter  Medications  . oxyCODONE (OXY IR/ROXICODONE) 5 MG immediate release tablet    Sig: Take 1 tablet (5 mg total) by mouth every 6 (six) hours as needed for up to 7 days for severe pain.    Dispense:  30 tablet    Refill:  0  . Fluticasone-Umeclidin-Vilant (TRELEGY ELLIPTA) 100-62.5-25 MCG/INH AEPB    Sig: Inhale 1 puff into the lungs daily.    Dispense:  120 each    Refill:  1     Follow-up: No follow-ups on file.  Scarlette Calico, MD

## 2019-01-13 NOTE — Patient Instructions (Signed)
Rib Fracture  A rib fracture is a break or crack in one of the bones of the ribs. The ribs are long, curved bones that wrap around your chest and attach to your spine and your breastbone. The ribs protect your heart, lungs, and other organs in the chest. A broken or cracked rib is often painful but is not usually serious. Most rib fractures heal on their own over time. However, rib fractures can be more serious if multiple ribs are broken or if broken ribs move out of place and push against other structures or organs. What are the causes? This condition is caused by:  Repetitive movements with high force, such as pitching a baseball or having severe coughing spells.  A direct blow to the chest, such as a sports injury, a car accident, or a fall.  Cancer that has spread to the bones, which can weaken bones and cause them to break. What are the signs or symptoms? Symptoms of this condition include:  Pain when you breathe in or cough.  Pain when someone presses on the injured area.  Feeling short of breath. How is this diagnosed? This condition is diagnosed with a physical exam and medical history. Imaging tests may also be done, such as:  Chest X-ray.  CT scan.  MRI.  Bone scan.  Chest ultrasound. How is this treated? Treatment for this condition depends on the severity of the fracture. Most rib fractures usually heal on their own in 1-3 months. Sometimes healing takes longer if there is a cough that does not stop or if there are other activities that make the injury worse (aggravating factors). While you heal, you will be given medicines to control the pain. You will also be taught deep breathing exercises. Severe injuries may require hospitalization or surgery. Follow these instructions at home: Managing pain, stiffness, and swelling  If directed, apply ice to the injured area. ? Put ice in a plastic bag. ? Place a towel between your skin and the bag. ? Leave the ice on for  20 minutes, 2-3 times a day.  Take over-the-counter and prescription medicines only as told by your health care provider. Activity  Avoid a lot of activity and any activities or movements that cause pain. Be careful during activities and avoid bumping the injured rib.  Slowly increase your activity as told by your health care provider. General instructions  Do deep breathing exercises as told by your health care provider. This helps prevent pneumonia, which is a common complication of a broken rib. Your health care provider may instruct you to: ? Take deep breaths several times a day. ? Try to cough several times a day, holding a pillow against the injured area. ? Use a device called incentive spirometer to practice deep breathing several times a day.  Drink enough fluid to keep your urine pale yellow.  Do not wear a rib belt or binder. These restrict breathing, which can lead to pneumonia.  Keep all follow-up visits as told by your health care provider. This is important. Contact a health care provider if:  You have a fever. Get help right away if:  You have difficulty breathing or you are short of breath.  You develop a cough that does not stop, or you cough up thick or bloody sputum.  You have nausea, vomiting, or pain in your abdomen.  Your pain gets worse and medicine does not help. Summary  A rib fracture is a break or crack in one of   the bones of the ribs.  A broken or cracked rib is often painful but is not usually serious.  Most rib fractures heal on their own over time.  Treatment for this condition depends on the severity of the fracture.  Avoid a lot of activity and any activities or movements that cause pain. This information is not intended to replace advice given to you by your health care provider. Make sure you discuss any questions you have with your health care provider. Document Released: 06/24/2005 Document Revised: 06/06/2017 Document Reviewed:  09/23/2016 Elsevier Patient Education  2020 [employer]  

## 2019-01-21 ENCOUNTER — Encounter: Payer: Self-pay | Admitting: Internal Medicine

## 2019-01-21 ENCOUNTER — Ambulatory Visit (INDEPENDENT_AMBULATORY_CARE_PROVIDER_SITE_OTHER): Payer: Medicare Other | Admitting: Internal Medicine

## 2019-01-21 ENCOUNTER — Other Ambulatory Visit: Payer: Self-pay

## 2019-01-21 VITALS — BP 136/60 | HR 70 | Temp 98.5°F | Ht 69.0 in | Wt 268.2 lb

## 2019-01-21 DIAGNOSIS — E1143 Type 2 diabetes mellitus with diabetic autonomic (poly)neuropathy: Secondary | ICD-10-CM | POA: Diagnosis not present

## 2019-01-21 DIAGNOSIS — I5022 Chronic systolic (congestive) heart failure: Secondary | ICD-10-CM | POA: Diagnosis not present

## 2019-01-21 DIAGNOSIS — E785 Hyperlipidemia, unspecified: Secondary | ICD-10-CM

## 2019-01-21 DIAGNOSIS — I10 Essential (primary) hypertension: Secondary | ICD-10-CM

## 2019-01-21 DIAGNOSIS — K2100 Gastro-esophageal reflux disease with esophagitis, without bleeding: Secondary | ICD-10-CM

## 2019-01-21 DIAGNOSIS — K3184 Gastroparesis: Secondary | ICD-10-CM | POA: Diagnosis not present

## 2019-01-21 DIAGNOSIS — I482 Chronic atrial fibrillation, unspecified: Secondary | ICD-10-CM

## 2019-01-21 DIAGNOSIS — J449 Chronic obstructive pulmonary disease, unspecified: Secondary | ICD-10-CM

## 2019-01-21 DIAGNOSIS — J441 Chronic obstructive pulmonary disease with (acute) exacerbation: Secondary | ICD-10-CM | POA: Diagnosis not present

## 2019-01-21 DIAGNOSIS — J301 Allergic rhinitis due to pollen: Secondary | ICD-10-CM | POA: Diagnosis not present

## 2019-01-21 DIAGNOSIS — E118 Type 2 diabetes mellitus with unspecified complications: Secondary | ICD-10-CM

## 2019-01-21 DIAGNOSIS — L89152 Pressure ulcer of sacral region, stage 2: Secondary | ICD-10-CM

## 2019-01-21 DIAGNOSIS — J4489 Other specified chronic obstructive pulmonary disease: Secondary | ICD-10-CM

## 2019-01-21 MED ORDER — INSULIN ASPART 100 UNIT/ML FLEXPEN: 5.0000 [IU] | PEN_INJECTOR | Freq: Three times a day (TID) | SUBCUTANEOUS | 5 refills | Status: DC

## 2019-01-21 MED ORDER — FARXIGA 10 MG PO TABS: 10.0000 mg | ORAL_TABLET | Freq: Every day | ORAL | 1 refills | Status: DC

## 2019-01-21 MED ORDER — NOVOFINE AUTOCOVER 30G X 8 MM MISC: 1 refills | Status: DC

## 2019-01-21 MED ORDER — METOCLOPRAMIDE HCL 5 MG PO TABS: ORAL_TABLET | ORAL | 3 refills | Status: DC

## 2019-01-21 MED ORDER — FLUTICASONE PROPIONATE 50 MCG/ACT NA SUSP: 2.0000 | Freq: Every day | NASAL | 5 refills | Status: DC

## 2019-01-21 MED ORDER — TRESIBA FLEXTOUCH 200 UNIT/ML ~~LOC~~ SOPN: 50.0000 [IU] | PEN_INJECTOR | Freq: Every day | SUBCUTANEOUS | 1 refills | Status: DC

## 2019-01-21 MED ORDER — TRELEGY ELLIPTA 100-62.5-25 MCG/INH IN AEPB: 1.0000 | INHALATION_SPRAY | Freq: Every day | RESPIRATORY_TRACT | 1 refills | Status: DC

## 2019-01-21 MED ORDER — SIMVASTATIN 20 MG PO TABS: 20.0000 mg | ORAL_TABLET | Freq: Every morning | ORAL | 1 refills | Status: DC

## 2019-01-21 MED ORDER — TRULICITY 1.5 MG/0.5ML ~~LOC~~ SOAJ: 1.0000 | SUBCUTANEOUS | 1 refills | Status: DC

## 2019-01-21 MED ORDER — LEVOCETIRIZINE DIHYDROCHLORIDE 5 MG PO TABS: 5.0000 mg | ORAL_TABLET | Freq: Every evening | ORAL | 1 refills | Status: DC

## 2019-01-21 MED ORDER — PANTOPRAZOLE SODIUM 40 MG PO TBEC: 40.0000 mg | DELAYED_RELEASE_TABLET | Freq: Every day | ORAL | 1 refills | Status: DC

## 2019-01-21 MED ORDER — RIVAROXABAN 20 MG PO TABS: 20.0000 mg | ORAL_TABLET | Freq: Every day | ORAL | 1 refills | Status: DC

## 2019-01-21 MED ORDER — POTASSIUM CHLORIDE CRYS ER 20 MEQ PO TBCR: 20.0000 meq | EXTENDED_RELEASE_TABLET | Freq: Two times a day (BID) | ORAL | 1 refills | Status: DC

## 2019-01-21 NOTE — Progress Notes (Signed)
Subjective:  Patient ID: Cody Hale, male    DOB: 09/25/1950  Age: 68 y.o. MRN: [id number]  CC: COPD   HPI Cody Hale presents for f/up - He was seen a week ago for evaluation of a fall.  He was found to have rib fractures on the left side.  He tells me he still has some discomfort but it is getting much better.  He tells me his wounds are getting better too.  He denies shortness of breath, hemoptysis, fever, or chills.  He is using the triple inhaler and says his breathing has improved significantly.  He tells me that for the last few weeks he has developed a bedsore on his buttocks.  Outpatient Medications Prior to Visit  Medication Sig Dispense Refill   acetaminophen (TYLENOL) 500 MG tablet Take 1 tablet (500 mg total) by mouth every 6 (six) hours as needed for mild pain. 120 tablet 5   carvedilol (COREG) 3.125 MG tablet Take 1 tablet (3.125 mg total) by mouth 2 (two) times daily. 60 tablet 6   cyanocobalamin (,VITAMIN B-12,) 1000 MCG/ML injection INJECT 1000mcg (1ml) INTRAMUSCULARLY EVERY 30 DAYS. 1 mL 5   losartan (COZAAR) 25 MG tablet Take 0.5 tablets (12.5 mg total) by mouth daily. 45 tablet 3   Magnesium Oxide 400 (240 Mg) MG TABS Take 1 tablet (400 mg total) by mouth 2 (two) times daily. 90 tablet 1   Multiple Vitamin (MULTIVITAMIN) tablet Take 1 tablet by mouth daily.     nitroGLYCERIN (NITROSTAT) 0.4 MG SL tablet Place 1 tablet (0.4 mg total) under the tongue every 5 (five) minutes as needed for chest pain. Max 3 doses. 10 tablet 0   polyethylene glycol (MIRALAX) packet Take 17 g by mouth daily. 14 each 0   torsemide (DEMADEX) 20 MG tablet Take 40 mg in the morning and 20 mg in the evening. (Patient taking differently: Take 40 mg by mouth 2 (two) times daily. ) 180 tablet 3   Vilazodone HCl (VIIBRYD) 40 MG TABS Take 1 tablet (40 mg total) by mouth daily.     dapagliflozin propanediol (FARXIGA) 10 MG TABS tablet Take 10 mg by mouth daily. 90 tablet 1    Dulaglutide (TRULICITY) 1.5 MG/0.5ML SOPN Inject 1 Act into the skin once a week. 12 pen 1   fluticasone (FLONASE) 50 MCG/ACT nasal spray Place 2 sprays into both nostrils daily. 16 g 5   Fluticasone-Umeclidin-Vilant (TRELEGY ELLIPTA) 100-62.5-25 MCG/INH AEPB Inhale 1 puff into the lungs daily. 120 each 1   insulin aspart (NOVOLOG) 100 UNIT/ML FlexPen Inject 5 Units into the skin 3 (three) times daily with meals. 15 mL 5   Insulin Degludec (TRESIBA FLEXTOUCH) 200 UNIT/ML SOPN Inject 50 Units into the skin daily. 9 mL 1   levocetirizine (XYZAL) 5 MG tablet Take 1 tablet (5 mg total) by mouth every evening. 90 tablet 1   metoCLOPramide (REGLAN) 5 MG tablet TAKE 1 TABLET BY MOUTH 3 TIMES DAILY BEFORE MEALS. 90 tablet 3   NOVOFINE AUTOCOVER 30G X 8 MM MISC USE AS DIRECTED FOR ONCE DAILY INSULIN ADMINISTRATION. 100 each 1   pantoprazole (PROTONIX) 40 MG tablet Take 1 tablet (40 mg total) by mouth daily. 90 tablet 1   potassium chloride SA (K-DUR) 20 MEQ tablet Take 1 tablet (20 mEq total) by mouth 2 (two) times daily. 180 tablet 1   rivaroxaban (XARELTO) 20 MG TABS tablet Take 1 tablet (20 mg total) by mouth daily. 90  tablet 1   simvastatin (ZOCOR) 20 MG tablet Take 1 tablet (20 mg total) by mouth every morning. 90 tablet 1   No facility-administered medications prior to visit.     ROS Review of Systems  Constitutional: Negative.  Negative for chills, diaphoresis, fatigue and fever.  HENT: Negative.   Eyes: Negative for visual disturbance.  Respiratory: Positive for shortness of breath. Negative for cough, chest tightness and wheezing.   Cardiovascular: Positive for chest pain. Negative for palpitations and leg swelling.  Gastrointestinal: Positive for nausea. Negative for abdominal pain, constipation, diarrhea and vomiting.  Genitourinary: Negative.  Negative for difficulty urinating, dysuria and hematuria.  Musculoskeletal: Positive for arthralgias. Negative for back pain and neck  pain.  Skin: Positive for wound.  Neurological: Negative.  Negative for weakness, light-headedness, numbness and headaches.  Hematological: Negative for adenopathy. Does not bruise/bleed easily.  Psychiatric/Behavioral: Negative.     Objective:  BP 136/60 (BP Location: Left Arm, Patient Position: Sitting, Cuff Size: Normal)    Pulse 70    Temp 98.5 F (36.9 C) (Oral)    Ht 5\' 9"  (1.753 m)    Wt 268 lb 3 oz (121.6 kg)    SpO2 98%    BMI 39.60 kg/m   BP Readings from Last 3 Encounters:  01/21/19 136/60  01/13/19 112/62  12/31/18 (!) 142/80    Wt Readings from Last 3 Encounters:  01/21/19 268 lb 3 oz (121.6 kg)  01/13/19 268 lb (121.6 kg)  12/31/18 273 lb (123.8 kg)    Physical Exam Vitals signs reviewed.  Constitutional:      General: He is not in acute distress.    Appearance: He is obese. He is not ill-appearing, toxic-appearing or diaphoretic.  HENT:     Nose: Nose normal.     Mouth/Throat:     Mouth: Mucous membranes are moist.  Eyes:     General: No scleral icterus.    Conjunctiva/sclera: Conjunctivae normal.  Neck:     Musculoskeletal: Normal range of motion. No neck rigidity or muscular tenderness.  Cardiovascular:     Rate and Rhythm: Normal rate and regular rhythm.     Heart sounds: Murmur present. No gallop.   Pulmonary:     Effort: Pulmonary effort is normal.     Breath sounds: No stridor. No wheezing, rhonchi or rales.  Chest:     Chest wall: Tenderness present. No deformity or swelling.  Abdominal:     General: Abdomen is protuberant. Bowel sounds are normal. There is no distension.     Palpations: There is no hepatomegaly or splenomegaly.     Tenderness: There is no abdominal tenderness.  Musculoskeletal: Normal range of motion.     Right lower leg: No edema.     Left lower leg: No edema.  Lymphadenopathy:     Cervical: No cervical adenopathy.  Skin:    General: Skin is warm and dry.     Coloration: Skin is not pale.     Findings: Wound present.       Comments: See photo  Neurological:     General: No focal deficit present.     Mental Status: He is alert.  Psychiatric:        Mood and Affect: Mood normal.        Behavior: Behavior normal.     Lab Results  Component Value Date   WBC 6.3 12/31/2018   HGB 13.4 12/31/2018   HCT 41.1 12/31/2018   PLT 132.0 (L) 12/31/2018  GLUCOSE 159 (H) 12/31/2018   CHOL 90 04/27/2018   TRIG 93.0 04/27/2018   HDL 39.10 04/27/2018   LDLDIRECT 84.2 01/31/2014   LDLCALC 32 04/27/2018   ALT 17 06/09/2018   AST 28 06/09/2018   NA 140 12/31/2018   K 4.4 12/31/2018   CL 99 12/31/2018   CREATININE 1.32 12/31/2018   BUN 32 (H) 12/31/2018   CO2 33 (H) 12/31/2018   TSH 1.54 07/22/2018   PSA 0.36 05/30/2014   INR 2.61 02/10/2017   HGBA1C 8.3 (H) 12/31/2018   MICROALBUR 1.7 07/22/2018    Dg Facial Bones Complete  Result Date: 01/13/2019 CLINICAL DATA:  Status post fall with left facial pain. EXAM: FACIAL BONES COMPLETE 3+V COMPARISON:  None. FINDINGS: There is no evidence of fracture or dislocation. No orbital emphysema or sinus air-fluid levels are seen. IMPRESSION: No definite acute fracture or dislocation noted. Electronically Signed   By: Sherian ReinWei-Chen  Lin M.D.   On: 01/13/2019 14:59   Dg Chest 2 View  Result Date: 01/13/2019 CLINICAL DATA:  Status post fall 5 days ago with left chest pain. EXAM: CHEST - 2 VIEW COMPARISON:  August 05, 2018 FINDINGS: The heart size and mediastinal contours are stable. No focal infiltrate, pulmonary edema, or pleural effusion is identified. There is no pneumothorax. There are possible deformities of several lateral left mid ribs fractures are not excluded. IMPRESSION: Possible deformities of several lateral left mid ribs, fracture is not excluded. Further evaluation with a left rib series is recommended. Lungs are clear.  There is no pneumothorax. Electronically Signed   By: Sherian ReinWei-Chen  Lin M.D.   On: 01/13/2019 14:54   Dg Knee Complete 4 Views Left  Result Date:  01/13/2019 CLINICAL DATA:  Status post fall five days ago with left knee pain. EXAM: LEFT KNEE - COMPLETE 4+ VIEW COMPARISON:  None. FINDINGS: No evidence of fracture, dislocation, or joint effusion. Narrow femoral tibial joint space with osteophyte formation are noted. Soft tissues are unremarkable. IMPRESSION: Osteoarthritic changes of left knee. Electronically Signed   By: Sherian ReinWei-Chen  Lin M.D.   On: 01/13/2019 14:57    Assessment & Plan:   Earnest ConroyCarlis was seen today for copd.  Diagnoses and all orders for this visit:  Gastroparesis due to DM (HCC) -     metoCLOPramide (REGLAN) 5 MG tablet; TAKE 1 TABLET BY MOUTH 3 TIMES DAILY BEFORE MEALS.  Type II diabetes mellitus with manifestations (HCC)- His blood sugars are adequately well controlled. -     dapagliflozin propanediol (FARXIGA) 10 MG TABS tablet; Take 10 mg by mouth daily. -     Dulaglutide (TRULICITY) 1.5 MG/0.5ML SOPN; Inject 1 Act into the skin once a week. -     insulin aspart (NOVOLOG) 100 UNIT/ML FlexPen; Inject 5 Units into the skin 3 (three) times daily with meals. -     Insulin Degludec (TRESIBA FLEXTOUCH) 200 UNIT/ML SOPN; Inject 50 Units into the skin daily. -     Insulin Pen Needle (NOVOFINE AUTOCOVER) 30G X 8 MM MISC; USE AS DIRECTED FOR ONCE DAILY INSULIN ADMINISTRATION.  Chronic systolic CHF (congestive heart failure) (HCC)- He has a normal fluid status. -     dapagliflozin propanediol (FARXIGA) 10 MG TABS tablet; Take 10 mg by mouth daily.  Allergic rhinitis due to pollen -     fluticasone (FLONASE) 50 MCG/ACT nasal spray; Place 2 sprays into both nostrils daily.  COPD with acute exacerbation (HCC)  COPD (chronic obstructive pulmonary disease) with chronic bronchitis (HCC) -  Fluticasone-Umeclidin-Vilant (TRELEGY ELLIPTA) 100-62.5-25 MCG/INH AEPB; Inhale 1 puff into the lungs daily.  Acute nonseasonal allergic rhinitis due to pollen -     levocetirizine (XYZAL) 5 MG tablet; Take 1 tablet (5 mg total) by mouth every  evening.  GERD with esophagitis -     pantoprazole (PROTONIX) 40 MG tablet; Take 1 tablet (40 mg total) by mouth daily.  Chronic atrial fibrillation- He has good rate and rhythm control.  Will continue anticoagulation with the DOAC. -     rivaroxaban (XARELTO) 20 MG TABS tablet; Take 1 tablet (20 mg total) by mouth daily.  Hyperlipidemia with target LDL less than 70 -     simvastatin (ZOCOR) 20 MG tablet; Take 1 tablet (20 mg total) by mouth every morning.  Essential hypertension, malignant -     potassium chloride SA (K-DUR) 20 MEQ tablet; Take 1 tablet (20 mEq total) by mouth 2 (two) times daily.  Decubitus ulcer of sacral region, stage 2 [facility]) -     Ambulatory referral to [place]   I have changed Aerion Rowland "David"'s NovoFine Autocover. I am also having him maintain his nitroGLYCERIN, polyethylene glycol, Vilazodone HCl, torsemide, acetaminophen, carvedilol, cyanocobalamin, multivitamin, Magnesium Oxide, losartan, Farxiga, Trulicity, fluticasone, Trelegy Ellipta, insulin aspart, Tresiba FlexTouch, levocetirizine, metoCLOPramide, pantoprazole, rivaroxaban, potassium chloride SA, and simvastatin.  Meds ordered this encounter  Medications   dapagliflozin propanediol (FARXIGA) 10 MG TABS tablet    Sig: Take 10 mg by mouth daily.    Dispense:  90 tablet    Refill:  1   Dulaglutide (TRULICITY) 1.5 KV/4.2VZ SOPN    Sig: Inject 1 Act into the skin once a week.    Dispense:  12 pen    Refill:  1   fluticasone (FLONASE) 50 MCG/ACT nasal spray    Sig: Place 2 sprays into both nostrils daily.    Dispense:  16 g    Refill:  5   Fluticasone-Umeclidin-Vilant (TRELEGY ELLIPTA) 100-62.5-25 MCG/INH AEPB    Sig: Inhale 1 puff into the lungs daily.    Dispense:  120 each    Refill:  1   insulin aspart (NOVOLOG) 100 UNIT/ML FlexPen    Sig: Inject 5 Units into the skin 3 (three) times daily with meals.    Dispense:  15 mL    Refill:  5   Insulin Degludec (TRESIBA FLEXTOUCH) 200  UNIT/ML SOPN    Sig: Inject 50 Units into the skin daily.    Dispense:  9 mL    Refill:  1   levocetirizine (XYZAL) 5 MG tablet    Sig: Take 1 tablet (5 mg total) by mouth every evening.    Dispense:  90 tablet    Refill:  1   metoCLOPramide (REGLAN) 5 MG tablet    Sig: TAKE 1 TABLET BY MOUTH 3 TIMES DAILY BEFORE MEALS.    Dispense:  90 tablet    Refill:  3   Insulin Pen Needle (NOVOFINE AUTOCOVER) 30G X 8 MM MISC    Sig: USE AS DIRECTED FOR ONCE DAILY INSULIN ADMINISTRATION.    Dispense:  100 each    Refill:  1   pantoprazole (PROTONIX) 40 MG tablet    Sig: Take 1 tablet (40 mg total) by mouth daily.    Dispense:  90 tablet    Refill:  1   rivaroxaban (XARELTO) 20 MG TABS tablet    Sig: Take 1 tablet (20 mg total) by mouth daily.    Dispense:  90 tablet  Refill:  1   potassium chloride SA (K-DUR) 20 MEQ tablet    Sig: Take 1 tablet (20 mEq total) by mouth 2 (two) times daily.    Dispense:  180 tablet    Refill:  1   simvastatin (ZOCOR) 20 MG tablet    Sig: Take 1 tablet (20 mg total) by mouth every morning.    Dispense:  90 tablet    Refill:  1     Follow-up: No follow-ups on file.  Sanda Linger, MD

## 2019-01-25 DIAGNOSIS — K3184 Gastroparesis: Secondary | ICD-10-CM | POA: Diagnosis not present

## 2019-01-25 DIAGNOSIS — Z602 Problems related to living alone: Secondary | ICD-10-CM | POA: Diagnosis not present

## 2019-01-25 DIAGNOSIS — Z9981 Dependence on supplemental oxygen: Secondary | ICD-10-CM | POA: Diagnosis not present

## 2019-01-25 DIAGNOSIS — I110 Hypertensive heart disease with heart failure: Secondary | ICD-10-CM | POA: Diagnosis not present

## 2019-01-25 DIAGNOSIS — L89152 Pressure ulcer of sacral region, stage 2: Secondary | ICD-10-CM | POA: Diagnosis not present

## 2019-01-25 DIAGNOSIS — I5022 Chronic systolic (congestive) heart failure: Secondary | ICD-10-CM | POA: Diagnosis not present

## 2019-01-25 DIAGNOSIS — W19XXXD Unspecified fall, subsequent encounter: Secondary | ICD-10-CM | POA: Diagnosis not present

## 2019-01-25 DIAGNOSIS — Z794 Long term (current) use of insulin: Secondary | ICD-10-CM | POA: Diagnosis not present

## 2019-01-25 DIAGNOSIS — S0083XD Contusion of other part of head, subsequent encounter: Secondary | ICD-10-CM | POA: Diagnosis not present

## 2019-01-25 DIAGNOSIS — I482 Chronic atrial fibrillation, unspecified: Secondary | ICD-10-CM | POA: Diagnosis not present

## 2019-01-25 DIAGNOSIS — M170 Bilateral primary osteoarthritis of knee: Secondary | ICD-10-CM | POA: Diagnosis not present

## 2019-01-25 DIAGNOSIS — E1143 Type 2 diabetes mellitus with diabetic autonomic (poly)neuropathy: Secondary | ICD-10-CM | POA: Diagnosis not present

## 2019-01-25 DIAGNOSIS — Z7901 Long term (current) use of anticoagulants: Secondary | ICD-10-CM | POA: Diagnosis not present

## 2019-01-25 DIAGNOSIS — Z9181 History of falling: Secondary | ICD-10-CM | POA: Diagnosis not present

## 2019-01-25 DIAGNOSIS — J441 Chronic obstructive pulmonary disease with (acute) exacerbation: Secondary | ICD-10-CM | POA: Diagnosis not present

## 2019-01-25 DIAGNOSIS — E785 Hyperlipidemia, unspecified: Secondary | ICD-10-CM | POA: Diagnosis not present

## 2019-01-25 DIAGNOSIS — K210 Gastro-esophageal reflux disease with esophagitis: Secondary | ICD-10-CM | POA: Diagnosis not present

## 2019-01-25 DIAGNOSIS — M169 Osteoarthritis of hip, unspecified: Secondary | ICD-10-CM | POA: Diagnosis not present

## 2019-01-26 ENCOUNTER — Telehealth: Payer: Self-pay | Admitting: Internal Medicine

## 2019-01-26 NOTE — Telephone Encounter (Signed)
Contacted [place] with [facility] - verbal okay given as requested.

## 2019-01-26 NOTE — Telephone Encounter (Signed)
Copied from [place] [id number]. Topic: Quick Communication - Home Health Verbal Orders >> Jan 26, 2019  3:51 PM Nils Flack wrote: Caller/Agency: Santina Evans Number: [phone] Requesting OT/PT/Skilled Nursing/Social Work/Speech Therapy: nursing and pt Frequency: pt - eval  Nursing  2 week 4  For pressure wounds   Wound care - collagen dressing, with hydra gel and cover foam dressing

## 2019-01-28 DIAGNOSIS — S0083XD Contusion of other part of head, subsequent encounter: Secondary | ICD-10-CM | POA: Diagnosis not present

## 2019-01-28 DIAGNOSIS — K3184 Gastroparesis: Secondary | ICD-10-CM | POA: Diagnosis not present

## 2019-01-28 DIAGNOSIS — L89152 Pressure ulcer of sacral region, stage 2: Secondary | ICD-10-CM | POA: Diagnosis not present

## 2019-01-28 DIAGNOSIS — E1143 Type 2 diabetes mellitus with diabetic autonomic (poly)neuropathy: Secondary | ICD-10-CM | POA: Diagnosis not present

## 2019-01-28 DIAGNOSIS — J441 Chronic obstructive pulmonary disease with (acute) exacerbation: Secondary | ICD-10-CM | POA: Diagnosis not present

## 2019-01-28 DIAGNOSIS — I110 Hypertensive heart disease with heart failure: Secondary | ICD-10-CM | POA: Diagnosis not present

## 2019-02-01 DIAGNOSIS — I110 Hypertensive heart disease with heart failure: Secondary | ICD-10-CM | POA: Diagnosis not present

## 2019-02-01 DIAGNOSIS — E1143 Type 2 diabetes mellitus with diabetic autonomic (poly)neuropathy: Secondary | ICD-10-CM | POA: Diagnosis not present

## 2019-02-01 DIAGNOSIS — K3184 Gastroparesis: Secondary | ICD-10-CM | POA: Diagnosis not present

## 2019-02-01 DIAGNOSIS — S0083XD Contusion of other part of head, subsequent encounter: Secondary | ICD-10-CM | POA: Diagnosis not present

## 2019-02-01 DIAGNOSIS — J441 Chronic obstructive pulmonary disease with (acute) exacerbation: Secondary | ICD-10-CM | POA: Diagnosis not present

## 2019-02-01 DIAGNOSIS — L89152 Pressure ulcer of sacral region, stage 2: Secondary | ICD-10-CM | POA: Diagnosis not present

## 2019-02-02 DIAGNOSIS — J441 Chronic obstructive pulmonary disease with (acute) exacerbation: Secondary | ICD-10-CM | POA: Diagnosis not present

## 2019-02-02 DIAGNOSIS — E1143 Type 2 diabetes mellitus with diabetic autonomic (poly)neuropathy: Secondary | ICD-10-CM | POA: Diagnosis not present

## 2019-02-02 DIAGNOSIS — K3184 Gastroparesis: Secondary | ICD-10-CM | POA: Diagnosis not present

## 2019-02-02 DIAGNOSIS — S0083XD Contusion of other part of head, subsequent encounter: Secondary | ICD-10-CM | POA: Diagnosis not present

## 2019-02-02 DIAGNOSIS — L89152 Pressure ulcer of sacral region, stage 2: Secondary | ICD-10-CM | POA: Diagnosis not present

## 2019-02-02 DIAGNOSIS — I110 Hypertensive heart disease with heart failure: Secondary | ICD-10-CM | POA: Diagnosis not present

## 2019-02-04 ENCOUNTER — Telehealth: Payer: Self-pay | Admitting: Internal Medicine

## 2019-02-04 DIAGNOSIS — S0083XD Contusion of other part of head, subsequent encounter: Secondary | ICD-10-CM | POA: Diagnosis not present

## 2019-02-04 DIAGNOSIS — J441 Chronic obstructive pulmonary disease with (acute) exacerbation: Secondary | ICD-10-CM | POA: Diagnosis not present

## 2019-02-04 DIAGNOSIS — E1143 Type 2 diabetes mellitus with diabetic autonomic (poly)neuropathy: Secondary | ICD-10-CM | POA: Diagnosis not present

## 2019-02-04 DIAGNOSIS — K3184 Gastroparesis: Secondary | ICD-10-CM | POA: Diagnosis not present

## 2019-02-04 DIAGNOSIS — I110 Hypertensive heart disease with heart failure: Secondary | ICD-10-CM | POA: Diagnosis not present

## 2019-02-04 DIAGNOSIS — L89152 Pressure ulcer of sacral region, stage 2: Secondary | ICD-10-CM | POA: Diagnosis not present

## 2019-02-04 NOTE — Telephone Encounter (Signed)
Requested refill for the carvedilol, losartan, magnesium oxide, torsemide and viibryd to [facility] in [place] Please advise.   Okay to send in B12 supplies to inject at home.   Per Agapito Games: Right pressure is healed and the left is almost healed.

## 2019-02-04 NOTE — Telephone Encounter (Signed)
Agapito Games, home health nurse, called and has questions about some medications pt was missing and she's needing to know if pt needs to still be taking them. She states she would like a call back today if possible. Please advise.   [phone]

## 2019-02-05 ENCOUNTER — Other Ambulatory Visit: Payer: Self-pay | Admitting: Internal Medicine

## 2019-02-05 DIAGNOSIS — I5042 Chronic combined systolic (congestive) and diastolic (congestive) heart failure: Secondary | ICD-10-CM

## 2019-02-05 DIAGNOSIS — Z602 Problems related to living alone: Secondary | ICD-10-CM

## 2019-02-05 DIAGNOSIS — I482 Chronic atrial fibrillation, unspecified: Secondary | ICD-10-CM

## 2019-02-05 DIAGNOSIS — E1143 Type 2 diabetes mellitus with diabetic autonomic (poly)neuropathy: Secondary | ICD-10-CM

## 2019-02-05 DIAGNOSIS — S0083XD Contusion of other part of head, subsequent encounter: Secondary | ICD-10-CM

## 2019-02-05 DIAGNOSIS — Z794 Long term (current) use of insulin: Secondary | ICD-10-CM

## 2019-02-05 DIAGNOSIS — W19XXXD Unspecified fall, subsequent encounter: Secondary | ICD-10-CM

## 2019-02-05 DIAGNOSIS — J441 Chronic obstructive pulmonary disease with (acute) exacerbation: Secondary | ICD-10-CM

## 2019-02-05 DIAGNOSIS — Z9181 History of falling: Secondary | ICD-10-CM

## 2019-02-05 DIAGNOSIS — K210 Gastro-esophageal reflux disease with esophagitis: Secondary | ICD-10-CM

## 2019-02-05 DIAGNOSIS — Z9981 Dependence on supplemental oxygen: Secondary | ICD-10-CM

## 2019-02-05 DIAGNOSIS — L89152 Pressure ulcer of sacral region, stage 2: Secondary | ICD-10-CM

## 2019-02-05 DIAGNOSIS — Z7901 Long term (current) use of anticoagulants: Secondary | ICD-10-CM

## 2019-02-05 DIAGNOSIS — I10 Essential (primary) hypertension: Secondary | ICD-10-CM

## 2019-02-05 DIAGNOSIS — M169 Osteoarthritis of hip, unspecified: Secondary | ICD-10-CM

## 2019-02-05 DIAGNOSIS — I110 Hypertensive heart disease with heart failure: Secondary | ICD-10-CM | POA: Diagnosis not present

## 2019-02-05 DIAGNOSIS — M170 Bilateral primary osteoarthritis of knee: Secondary | ICD-10-CM | POA: Diagnosis not present

## 2019-02-05 DIAGNOSIS — K3184 Gastroparesis: Secondary | ICD-10-CM

## 2019-02-05 DIAGNOSIS — F329 Major depressive disorder, single episode, unspecified: Secondary | ICD-10-CM

## 2019-02-05 DIAGNOSIS — E785 Hyperlipidemia, unspecified: Secondary | ICD-10-CM

## 2019-02-05 DIAGNOSIS — F450 Somatization disorder: Secondary | ICD-10-CM

## 2019-02-05 DIAGNOSIS — I5022 Chronic systolic (congestive) heart failure: Secondary | ICD-10-CM

## 2019-02-05 MED ORDER — "BD SYRINGE/NEEDLE 25G X 5/8"" 3 ML MISC": 0 refills | Status: DC

## 2019-02-05 MED ORDER — MAGNESIUM OXIDE -MG SUPPLEMENT 400 (240 MG) MG PO TABS: 1.0000 | ORAL_TABLET | Freq: Two times a day (BID) | ORAL | 1 refills | Status: DC

## 2019-02-05 MED ORDER — TORSEMIDE 20 MG PO TABS: 40.0000 mg | ORAL_TABLET | Freq: Two times a day (BID) | ORAL | 1 refills | Status: DC

## 2019-02-05 MED ORDER — CYANOCOBALAMIN 1000 MCG/ML IJ SOLN: 1000.0000 ug | Freq: Once | INTRAMUSCULAR | 3 refills | Status: AC

## 2019-02-05 MED ORDER — VIIBRYD 40 MG PO TABS: 40.0000 mg | ORAL_TABLET | Freq: Every day | ORAL | 1 refills | Status: DC

## 2019-02-05 MED ORDER — CARVEDILOL 3.125 MG PO TABS: 3.1250 mg | ORAL_TABLET | Freq: Two times a day (BID) | ORAL | 1 refills | Status: DC

## 2019-02-05 MED ORDER — LOSARTAN POTASSIUM 25 MG PO TABS: 12.5000 mg | ORAL_TABLET | Freq: Every day | ORAL | 1 refills | Status: DC

## 2019-02-05 NOTE — Telephone Encounter (Signed)
[facility] sent for B12 items.   lvm for pt informing of same.

## 2019-02-05 NOTE — Telephone Encounter (Signed)
RX's sent Yes on the B12  TJ

## 2019-02-08 DIAGNOSIS — I110 Hypertensive heart disease with heart failure: Secondary | ICD-10-CM | POA: Diagnosis not present

## 2019-02-08 DIAGNOSIS — L89152 Pressure ulcer of sacral region, stage 2: Secondary | ICD-10-CM | POA: Diagnosis not present

## 2019-02-08 DIAGNOSIS — E1143 Type 2 diabetes mellitus with diabetic autonomic (poly)neuropathy: Secondary | ICD-10-CM | POA: Diagnosis not present

## 2019-02-08 DIAGNOSIS — J441 Chronic obstructive pulmonary disease with (acute) exacerbation: Secondary | ICD-10-CM | POA: Diagnosis not present

## 2019-02-08 DIAGNOSIS — S0083XD Contusion of other part of head, subsequent encounter: Secondary | ICD-10-CM | POA: Diagnosis not present

## 2019-02-08 DIAGNOSIS — K3184 Gastroparesis: Secondary | ICD-10-CM | POA: Diagnosis not present

## 2019-02-12 DIAGNOSIS — J441 Chronic obstructive pulmonary disease with (acute) exacerbation: Secondary | ICD-10-CM | POA: Diagnosis not present

## 2019-02-12 DIAGNOSIS — K3184 Gastroparesis: Secondary | ICD-10-CM | POA: Diagnosis not present

## 2019-02-12 DIAGNOSIS — E1143 Type 2 diabetes mellitus with diabetic autonomic (poly)neuropathy: Secondary | ICD-10-CM | POA: Diagnosis not present

## 2019-02-12 DIAGNOSIS — L89152 Pressure ulcer of sacral region, stage 2: Secondary | ICD-10-CM | POA: Diagnosis not present

## 2019-02-12 DIAGNOSIS — S0083XD Contusion of other part of head, subsequent encounter: Secondary | ICD-10-CM | POA: Diagnosis not present

## 2019-02-12 DIAGNOSIS — I110 Hypertensive heart disease with heart failure: Secondary | ICD-10-CM | POA: Diagnosis not present

## 2019-02-15 DIAGNOSIS — J441 Chronic obstructive pulmonary disease with (acute) exacerbation: Secondary | ICD-10-CM | POA: Diagnosis not present

## 2019-02-15 DIAGNOSIS — S0083XD Contusion of other part of head, subsequent encounter: Secondary | ICD-10-CM | POA: Diagnosis not present

## 2019-02-15 DIAGNOSIS — L89152 Pressure ulcer of sacral region, stage 2: Secondary | ICD-10-CM | POA: Diagnosis not present

## 2019-02-15 DIAGNOSIS — E1143 Type 2 diabetes mellitus with diabetic autonomic (poly)neuropathy: Secondary | ICD-10-CM | POA: Diagnosis not present

## 2019-02-15 DIAGNOSIS — I110 Hypertensive heart disease with heart failure: Secondary | ICD-10-CM | POA: Diagnosis not present

## 2019-02-15 DIAGNOSIS — K3184 Gastroparesis: Secondary | ICD-10-CM | POA: Diagnosis not present

## 2019-02-16 ENCOUNTER — Other Ambulatory Visit: Payer: Self-pay

## 2019-02-16 ENCOUNTER — Telehealth (INDEPENDENT_AMBULATORY_CARE_PROVIDER_SITE_OTHER): Payer: Medicare Other | Admitting: Student

## 2019-02-16 ENCOUNTER — Encounter: Payer: Self-pay | Admitting: Student

## 2019-02-16 VITALS — BP 140/63 | HR 71 | Ht 69.0 in | Wt 260.0 lb

## 2019-02-16 DIAGNOSIS — I5042 Chronic combined systolic (congestive) and diastolic (congestive) heart failure: Secondary | ICD-10-CM

## 2019-02-16 DIAGNOSIS — I4821 Permanent atrial fibrillation: Secondary | ICD-10-CM

## 2019-02-16 DIAGNOSIS — E785 Hyperlipidemia, unspecified: Secondary | ICD-10-CM

## 2019-02-16 DIAGNOSIS — G4733 Obstructive sleep apnea (adult) (pediatric): Secondary | ICD-10-CM

## 2019-02-16 DIAGNOSIS — I10 Essential (primary) hypertension: Secondary | ICD-10-CM

## 2019-02-16 DIAGNOSIS — Z953 Presence of xenogenic heart valve: Secondary | ICD-10-CM

## 2019-02-16 NOTE — Patient Instructions (Signed)
Medication Instructions:  Your physician recommends that you continue on your current medications as directed. Please refer to the Current Medication list given to you today.  If you need a refill on your cardiac medications before your next appointment, please call your pharmacy.   Lab work: NONE   If you have labs (blood work) drawn today and your tests are completely normal, you will receive your results only by: . MyChart Message (if you have MyChart) OR . A paper copy in the mail If you have any lab test that is abnormal or we need to change your treatment, we will call you to review the results.  Testing/Procedures: NONE   Follow-Up: At [facility], you and your health needs are our priority.  As part of our continuing mission to provide you with exceptional heart care, we have created designated Provider Care Teams.  These Care Teams include your primary Cardiologist (physician) and Advanced Practice Providers (APPs -  Physician Assistants and Nurse Practitioners) who all work together to provide you with the care you need, when you need it. You will need a follow up appointment in 6 months.  Please call our office 2 months in advance to schedule this appointment.  You may see Suresh Koneswaran, MD or one of the following Advanced Practice Providers on your designated Care Team:   Brittany Strader, PA-C ([place] Office) . Michele Lenze, PA-C ([place] Office)  Any Other Special Instructions Will Be Listed Below (If Applicable). Thank you for choosing [facility]!     
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
## 2019-02-18 ENCOUNTER — Telehealth: Payer: Self-pay | Admitting: Internal Medicine

## 2019-02-18 DIAGNOSIS — I110 Hypertensive heart disease with heart failure: Secondary | ICD-10-CM | POA: Diagnosis not present

## 2019-02-18 DIAGNOSIS — K3184 Gastroparesis: Secondary | ICD-10-CM | POA: Diagnosis not present

## 2019-02-18 DIAGNOSIS — L89152 Pressure ulcer of sacral region, stage 2: Secondary | ICD-10-CM | POA: Diagnosis not present

## 2019-02-18 DIAGNOSIS — S0083XD Contusion of other part of head, subsequent encounter: Secondary | ICD-10-CM | POA: Diagnosis not present

## 2019-02-18 DIAGNOSIS — J441 Chronic obstructive pulmonary disease with (acute) exacerbation: Secondary | ICD-10-CM | POA: Diagnosis not present

## 2019-02-18 DIAGNOSIS — E1143 Type 2 diabetes mellitus with diabetic autonomic (poly)neuropathy: Secondary | ICD-10-CM | POA: Diagnosis not present

## 2019-02-18 NOTE — Telephone Encounter (Signed)
Medication: Insulin Degludec (TRESIBA FLEXTOUCH) 200 UNIT/ML SOPN [[id number]]   Has the patient contacted their pharmacy? Yes  (Agent: If no, request that the patient contact the pharmacy for the refill.) (Agent: If yes, when and what did the pharmacy advise?)  Preferred Pharmacy (with phone number or street name): [facility], [address] [phone] (Phone) [phone] (Fax)    Agent: Please be advised that RX refills may take up to 3 business days. We ask that you follow-up with your pharmacy.

## 2019-02-18 NOTE — Telephone Encounter (Signed)
Left detailed message for pt informing that rx was sent in July. If he has not picked up then it should be at the pharmacy. If so, then there should be a refill as well.

## 2019-02-19 DIAGNOSIS — J441 Chronic obstructive pulmonary disease with (acute) exacerbation: Secondary | ICD-10-CM | POA: Diagnosis not present

## 2019-02-19 DIAGNOSIS — K3184 Gastroparesis: Secondary | ICD-10-CM | POA: Diagnosis not present

## 2019-02-19 DIAGNOSIS — L89152 Pressure ulcer of sacral region, stage 2: Secondary | ICD-10-CM | POA: Diagnosis not present

## 2019-02-19 DIAGNOSIS — E1143 Type 2 diabetes mellitus with diabetic autonomic (poly)neuropathy: Secondary | ICD-10-CM | POA: Diagnosis not present

## 2019-02-19 DIAGNOSIS — S0083XD Contusion of other part of head, subsequent encounter: Secondary | ICD-10-CM | POA: Diagnosis not present

## 2019-02-19 DIAGNOSIS — I110 Hypertensive heart disease with heart failure: Secondary | ICD-10-CM | POA: Diagnosis not present

## 2019-02-22 ENCOUNTER — Telehealth: Payer: Self-pay | Admitting: Internal Medicine

## 2019-02-22 DIAGNOSIS — K3184 Gastroparesis: Secondary | ICD-10-CM | POA: Diagnosis not present

## 2019-02-22 DIAGNOSIS — L89152 Pressure ulcer of sacral region, stage 2: Secondary | ICD-10-CM | POA: Diagnosis not present

## 2019-02-22 DIAGNOSIS — J441 Chronic obstructive pulmonary disease with (acute) exacerbation: Secondary | ICD-10-CM | POA: Diagnosis not present

## 2019-02-22 DIAGNOSIS — E1143 Type 2 diabetes mellitus with diabetic autonomic (poly)neuropathy: Secondary | ICD-10-CM | POA: Diagnosis not present

## 2019-02-22 DIAGNOSIS — I110 Hypertensive heart disease with heart failure: Secondary | ICD-10-CM | POA: Diagnosis not present

## 2019-02-22 DIAGNOSIS — S0083XD Contusion of other part of head, subsequent encounter: Secondary | ICD-10-CM | POA: Diagnosis not present

## 2019-02-22 NOTE — Telephone Encounter (Signed)
Ok with me  TJ 

## 2019-02-22 NOTE — Telephone Encounter (Signed)
Home Health Verbal Orders - Caller/Agency: [facility] Number: [phone] Requesting OT/PT/Skilled Nursing/Social Work/Speech Therapy: continue nursing for wound on sacral area (rt and left buttock)

## 2019-02-23 ENCOUNTER — Other Ambulatory Visit: Payer: Self-pay | Admitting: Internal Medicine

## 2019-02-23 DIAGNOSIS — E118 Type 2 diabetes mellitus with unspecified complications: Secondary | ICD-10-CM

## 2019-02-23 MED ORDER — NOVOFINE AUTOCOVER 30G X 8 MM MISC: 1 refills | Status: AC

## 2019-02-23 NOTE — Telephone Encounter (Signed)
Spoke with [place] to give verbal orders per MD

## 2019-02-24 DIAGNOSIS — S0083XD Contusion of other part of head, subsequent encounter: Secondary | ICD-10-CM | POA: Diagnosis not present

## 2019-02-24 DIAGNOSIS — I5022 Chronic systolic (congestive) heart failure: Secondary | ICD-10-CM | POA: Diagnosis not present

## 2019-02-24 DIAGNOSIS — I110 Hypertensive heart disease with heart failure: Secondary | ICD-10-CM | POA: Diagnosis not present

## 2019-02-24 DIAGNOSIS — E785 Hyperlipidemia, unspecified: Secondary | ICD-10-CM | POA: Diagnosis not present

## 2019-02-24 DIAGNOSIS — Z9181 History of falling: Secondary | ICD-10-CM | POA: Diagnosis not present

## 2019-02-24 DIAGNOSIS — Z7901 Long term (current) use of anticoagulants: Secondary | ICD-10-CM | POA: Diagnosis not present

## 2019-02-24 DIAGNOSIS — I482 Chronic atrial fibrillation, unspecified: Secondary | ICD-10-CM | POA: Diagnosis not present

## 2019-02-24 DIAGNOSIS — L89152 Pressure ulcer of sacral region, stage 2: Secondary | ICD-10-CM | POA: Diagnosis not present

## 2019-02-24 DIAGNOSIS — Z602 Problems related to living alone: Secondary | ICD-10-CM | POA: Diagnosis not present

## 2019-02-24 DIAGNOSIS — Z9981 Dependence on supplemental oxygen: Secondary | ICD-10-CM | POA: Diagnosis not present

## 2019-02-24 DIAGNOSIS — K3184 Gastroparesis: Secondary | ICD-10-CM | POA: Diagnosis not present

## 2019-02-24 DIAGNOSIS — K210 Gastro-esophageal reflux disease with esophagitis: Secondary | ICD-10-CM | POA: Diagnosis not present

## 2019-02-24 DIAGNOSIS — M169 Osteoarthritis of hip, unspecified: Secondary | ICD-10-CM | POA: Diagnosis not present

## 2019-02-24 DIAGNOSIS — J441 Chronic obstructive pulmonary disease with (acute) exacerbation: Secondary | ICD-10-CM | POA: Diagnosis not present

## 2019-02-24 DIAGNOSIS — E1143 Type 2 diabetes mellitus with diabetic autonomic (poly)neuropathy: Secondary | ICD-10-CM | POA: Diagnosis not present

## 2019-02-24 DIAGNOSIS — Z794 Long term (current) use of insulin: Secondary | ICD-10-CM | POA: Diagnosis not present

## 2019-02-24 DIAGNOSIS — W19XXXD Unspecified fall, subsequent encounter: Secondary | ICD-10-CM | POA: Diagnosis not present

## 2019-02-24 DIAGNOSIS — M170 Bilateral primary osteoarthritis of knee: Secondary | ICD-10-CM | POA: Diagnosis not present

## 2019-02-25 DIAGNOSIS — K3184 Gastroparesis: Secondary | ICD-10-CM | POA: Diagnosis not present

## 2019-02-25 DIAGNOSIS — J441 Chronic obstructive pulmonary disease with (acute) exacerbation: Secondary | ICD-10-CM | POA: Diagnosis not present

## 2019-02-25 DIAGNOSIS — S0083XD Contusion of other part of head, subsequent encounter: Secondary | ICD-10-CM | POA: Diagnosis not present

## 2019-02-25 DIAGNOSIS — I110 Hypertensive heart disease with heart failure: Secondary | ICD-10-CM | POA: Diagnosis not present

## 2019-02-25 DIAGNOSIS — E1143 Type 2 diabetes mellitus with diabetic autonomic (poly)neuropathy: Secondary | ICD-10-CM | POA: Diagnosis not present

## 2019-02-25 DIAGNOSIS — L89152 Pressure ulcer of sacral region, stage 2: Secondary | ICD-10-CM | POA: Diagnosis not present

## 2019-03-01 ENCOUNTER — Telehealth: Payer: Self-pay | Admitting: Internal Medicine

## 2019-03-01 DIAGNOSIS — I110 Hypertensive heart disease with heart failure: Secondary | ICD-10-CM | POA: Diagnosis not present

## 2019-03-01 DIAGNOSIS — E118 Type 2 diabetes mellitus with unspecified complications: Secondary | ICD-10-CM

## 2019-03-01 DIAGNOSIS — J441 Chronic obstructive pulmonary disease with (acute) exacerbation: Secondary | ICD-10-CM | POA: Diagnosis not present

## 2019-03-01 DIAGNOSIS — S0083XD Contusion of other part of head, subsequent encounter: Secondary | ICD-10-CM | POA: Diagnosis not present

## 2019-03-01 DIAGNOSIS — K3184 Gastroparesis: Secondary | ICD-10-CM | POA: Diagnosis not present

## 2019-03-01 DIAGNOSIS — E1143 Type 2 diabetes mellitus with diabetic autonomic (poly)neuropathy: Secondary | ICD-10-CM | POA: Diagnosis not present

## 2019-03-01 DIAGNOSIS — L89152 Pressure ulcer of sacral region, stage 2: Secondary | ICD-10-CM | POA: Diagnosis not present

## 2019-03-01 NOTE — Telephone Encounter (Signed)
Pt states he uses Easy Max testing strips. Pt states the pharmacy said if he could get the dr to end a Rx, they will order for him.   Pt test 3 X a day  [facility] [address] [place] [phone] (Phone) [phone] (Fax)

## 2019-03-03 NOTE — Telephone Encounter (Signed)
Pt states his insurance will pay for   Accu Check glucose meter  Pt needs new Rx for meter, strips ,and lancets.  ([facility] unable to order easy max)   [place], [place] - [place] [phone] (Phone) [phone] (Fax)

## 2019-03-04 DIAGNOSIS — S0083XD Contusion of other part of head, subsequent encounter: Secondary | ICD-10-CM | POA: Diagnosis not present

## 2019-03-04 DIAGNOSIS — L89152 Pressure ulcer of sacral region, stage 2: Secondary | ICD-10-CM | POA: Diagnosis not present

## 2019-03-04 DIAGNOSIS — J441 Chronic obstructive pulmonary disease with (acute) exacerbation: Secondary | ICD-10-CM | POA: Diagnosis not present

## 2019-03-04 DIAGNOSIS — I110 Hypertensive heart disease with heart failure: Secondary | ICD-10-CM | POA: Diagnosis not present

## 2019-03-04 DIAGNOSIS — K3184 Gastroparesis: Secondary | ICD-10-CM | POA: Diagnosis not present

## 2019-03-04 DIAGNOSIS — E1143 Type 2 diabetes mellitus with diabetic autonomic (poly)neuropathy: Secondary | ICD-10-CM | POA: Diagnosis not present

## 2019-03-04 MED ORDER — ACCU-CHEK SOFT TOUCH LANCETS MISC: 3 refills | Status: AC

## 2019-03-04 MED ORDER — BLOOD GLUCOSE MONITOR KIT: PACK | 0 refills | Status: AC

## 2019-03-04 MED ORDER — COOL BLOOD GLUCOSE TEST STRIPS VI STRP: ORAL_STRIP | 3 refills | Status: AC

## 2019-03-04 NOTE — Telephone Encounter (Signed)
Patient calling in to check on status of medication refill

## 2019-03-04 NOTE — Telephone Encounter (Signed)
Rx has been printed for PCP to sign.

## 2019-03-05 NOTE — Telephone Encounter (Signed)
erx and printed rx has been sent.

## 2019-03-05 NOTE — Telephone Encounter (Signed)
Tried to call pt but vm was full and unable to leave a message.

## 2019-03-08 DIAGNOSIS — I110 Hypertensive heart disease with heart failure: Secondary | ICD-10-CM | POA: Diagnosis not present

## 2019-03-08 DIAGNOSIS — K3184 Gastroparesis: Secondary | ICD-10-CM | POA: Diagnosis not present

## 2019-03-08 DIAGNOSIS — J441 Chronic obstructive pulmonary disease with (acute) exacerbation: Secondary | ICD-10-CM | POA: Diagnosis not present

## 2019-03-08 DIAGNOSIS — S0083XD Contusion of other part of head, subsequent encounter: Secondary | ICD-10-CM | POA: Diagnosis not present

## 2019-03-08 DIAGNOSIS — E1143 Type 2 diabetes mellitus with diabetic autonomic (poly)neuropathy: Secondary | ICD-10-CM | POA: Diagnosis not present

## 2019-03-08 DIAGNOSIS — L89152 Pressure ulcer of sacral region, stage 2: Secondary | ICD-10-CM | POA: Diagnosis not present

## 2019-03-11 DIAGNOSIS — S0083XD Contusion of other part of head, subsequent encounter: Secondary | ICD-10-CM | POA: Diagnosis not present

## 2019-03-11 DIAGNOSIS — I110 Hypertensive heart disease with heart failure: Secondary | ICD-10-CM | POA: Diagnosis not present

## 2019-03-11 DIAGNOSIS — K3184 Gastroparesis: Secondary | ICD-10-CM | POA: Diagnosis not present

## 2019-03-11 DIAGNOSIS — E1143 Type 2 diabetes mellitus with diabetic autonomic (poly)neuropathy: Secondary | ICD-10-CM | POA: Diagnosis not present

## 2019-03-11 DIAGNOSIS — J441 Chronic obstructive pulmonary disease with (acute) exacerbation: Secondary | ICD-10-CM | POA: Diagnosis not present

## 2019-03-11 DIAGNOSIS — L89152 Pressure ulcer of sacral region, stage 2: Secondary | ICD-10-CM | POA: Diagnosis not present

## 2019-03-16 DIAGNOSIS — K3184 Gastroparesis: Secondary | ICD-10-CM | POA: Diagnosis not present

## 2019-03-16 DIAGNOSIS — E1143 Type 2 diabetes mellitus with diabetic autonomic (poly)neuropathy: Secondary | ICD-10-CM | POA: Diagnosis not present

## 2019-03-16 DIAGNOSIS — S0083XD Contusion of other part of head, subsequent encounter: Secondary | ICD-10-CM | POA: Diagnosis not present

## 2019-03-16 DIAGNOSIS — I110 Hypertensive heart disease with heart failure: Secondary | ICD-10-CM | POA: Diagnosis not present

## 2019-03-16 DIAGNOSIS — L89152 Pressure ulcer of sacral region, stage 2: Secondary | ICD-10-CM | POA: Diagnosis not present

## 2019-03-16 DIAGNOSIS — J441 Chronic obstructive pulmonary disease with (acute) exacerbation: Secondary | ICD-10-CM | POA: Diagnosis not present

## 2019-03-17 DIAGNOSIS — S0083XD Contusion of other part of head, subsequent encounter: Secondary | ICD-10-CM | POA: Diagnosis not present

## 2019-03-17 DIAGNOSIS — L89152 Pressure ulcer of sacral region, stage 2: Secondary | ICD-10-CM | POA: Diagnosis not present

## 2019-03-17 DIAGNOSIS — E1143 Type 2 diabetes mellitus with diabetic autonomic (poly)neuropathy: Secondary | ICD-10-CM | POA: Diagnosis not present

## 2019-03-17 DIAGNOSIS — J441 Chronic obstructive pulmonary disease with (acute) exacerbation: Secondary | ICD-10-CM | POA: Diagnosis not present

## 2019-03-17 DIAGNOSIS — I110 Hypertensive heart disease with heart failure: Secondary | ICD-10-CM | POA: Diagnosis not present

## 2019-03-17 DIAGNOSIS — K3184 Gastroparesis: Secondary | ICD-10-CM | POA: Diagnosis not present

## 2019-03-18 DIAGNOSIS — L89152 Pressure ulcer of sacral region, stage 2: Secondary | ICD-10-CM | POA: Diagnosis not present

## 2019-03-18 DIAGNOSIS — J441 Chronic obstructive pulmonary disease with (acute) exacerbation: Secondary | ICD-10-CM | POA: Diagnosis not present

## 2019-03-18 DIAGNOSIS — E1143 Type 2 diabetes mellitus with diabetic autonomic (poly)neuropathy: Secondary | ICD-10-CM | POA: Diagnosis not present

## 2019-03-18 DIAGNOSIS — I110 Hypertensive heart disease with heart failure: Secondary | ICD-10-CM | POA: Diagnosis not present

## 2019-03-18 DIAGNOSIS — K3184 Gastroparesis: Secondary | ICD-10-CM | POA: Diagnosis not present

## 2019-03-18 DIAGNOSIS — E119 Type 2 diabetes mellitus without complications: Secondary | ICD-10-CM | POA: Diagnosis not present

## 2019-03-18 DIAGNOSIS — S0083XD Contusion of other part of head, subsequent encounter: Secondary | ICD-10-CM | POA: Diagnosis not present

## 2019-03-18 DIAGNOSIS — H40033 Anatomical narrow angle, bilateral: Secondary | ICD-10-CM | POA: Diagnosis not present

## 2019-03-18 LAB — HM DIABETES EYE EXAM

## 2019-03-24 ENCOUNTER — Encounter: Payer: Self-pay | Admitting: Internal Medicine

## 2019-03-24 ENCOUNTER — Other Ambulatory Visit (INDEPENDENT_AMBULATORY_CARE_PROVIDER_SITE_OTHER): Payer: Medicare Other

## 2019-03-24 ENCOUNTER — Other Ambulatory Visit: Payer: Self-pay

## 2019-03-24 ENCOUNTER — Ambulatory Visit (INDEPENDENT_AMBULATORY_CARE_PROVIDER_SITE_OTHER): Payer: Medicare Other | Admitting: Internal Medicine

## 2019-03-24 VITALS — BP 148/68 | HR 78 | Temp 98.2°F | Resp 16 | Ht 69.0 in | Wt 266.0 lb

## 2019-03-24 DIAGNOSIS — I10 Essential (primary) hypertension: Secondary | ICD-10-CM

## 2019-03-24 DIAGNOSIS — E118 Type 2 diabetes mellitus with unspecified complications: Secondary | ICD-10-CM | POA: Diagnosis not present

## 2019-03-24 DIAGNOSIS — E785 Hyperlipidemia, unspecified: Secondary | ICD-10-CM | POA: Diagnosis not present

## 2019-03-24 DIAGNOSIS — D510 Vitamin B12 deficiency anemia due to intrinsic factor deficiency: Secondary | ICD-10-CM

## 2019-03-24 DIAGNOSIS — Z23 Encounter for immunization: Secondary | ICD-10-CM | POA: Diagnosis not present

## 2019-03-24 LAB — BASIC METABOLIC PANEL
BUN: 23 mg/dL (ref 6–23)
CO2: 33 mEq/L — ABNORMAL HIGH (ref 19–32)
Calcium: 9.9 mg/dL (ref 8.4–10.5)
Chloride: 100 mEq/L (ref 96–112)
Creatinine, Ser: 1.28 mg/dL (ref 0.40–1.50)
GFR: 55.82 mL/min — ABNORMAL LOW (ref >60.00)
Glucose, Bld: 161 mg/dL — ABNORMAL HIGH (ref 70–99)
Potassium: 4.5 mEq/L (ref 3.5–5.1)
Sodium: 141 mEq/L (ref 135–145)

## 2019-03-24 LAB — LIPID PANEL
Cholesterol: 121 mg/dL (ref 0–200)
HDL: 34.2 mg/dL — ABNORMAL LOW (ref >39.00)
LDL Cholesterol: 51 mg/dL (ref 0–99)
NonHDL: 86.97
Total CHOL/HDL Ratio: 4
Triglycerides: 181 mg/dL — ABNORMAL HIGH (ref 0.0–149.0)
VLDL: 36.2 mg/dL (ref 0.0–40.0)

## 2019-03-24 LAB — HEMOGLOBIN A1C: Hgb A1c MFr Bld: 7.4 % — ABNORMAL HIGH (ref 4.6–6.5)

## 2019-03-24 LAB — FOLATE: Folate: >24.7 ng/mL (ref >5.9)

## 2019-03-24 MED ORDER — CYANOCOBALAMIN 1000 MCG/ML IJ SOLN
1000.0000 ug | Freq: Once | INTRAMUSCULAR | Status: AC
  Administered 2019-03-24: 1000 ug via INTRAMUSCULAR

## 2019-03-24 NOTE — Progress Notes (Signed)
Subjective:  Patient ID: Cody Hale, male    DOB: 1950-10-03  Age: 68 y.o. MRN: [id number]  CC: Hypertension, Hyperlipidemia, and Diabetes   HPI Loxley Munford presents for f/up - He feels well today and offers no complaints.  He is doing well on the GLP-1 agonist.  He tells me his blood sugars have been much better.  He has had no recent episodes of hypoglycemia.  Outpatient Medications Prior to Visit  Medication Sig Dispense Refill  . acetaminophen (TYLENOL) 500 MG tablet Take 1 tablet (500 mg total) by mouth every 6 (six) hours as needed for mild pain. 120 tablet 5  . blood glucose meter kit and supplies KIT Use to check blood sugar 3x per day. DX: E11.8 Accu Check 1 each 0  . carvedilol (COREG) 3.125 MG tablet Take 1 tablet (3.125 mg total) by mouth 2 (two) times daily. 180 tablet 1  . dapagliflozin propanediol (FARXIGA) 10 MG TABS tablet Take 10 mg by mouth daily. 90 tablet 1  . Dulaglutide (TRULICITY) 1.68 YO/3.7CH SOPN Inject 1 Act into the skin once a week. 12 pen 1  . fluticasone (FLONASE) 50 MCG/ACT nasal spray Place 2 sprays into both nostrils daily. 16 g 5  . Fluticasone-Umeclidin-Vilant (TRELEGY ELLIPTA) 100-62.5-25 MCG/INH AEPB Inhale 1 puff into the lungs daily. 120 each 1  . glucose blood (COOL BLOOD GLUCOSE TEST STRIPS) test strip Use to check blood sugar 3x per day. DX: E11.8 Accu Check 300 each 3  . insulin aspart (NOVOLOG) 100 UNIT/ML FlexPen Inject 5 Units into the skin 3 (three) times daily with meals. 15 mL 5  . Insulin Degludec (TRESIBA FLEXTOUCH) 200 UNIT/ML SOPN Inject 50 Units into the skin daily. 9 mL 1  . Insulin Pen Needle (NOVOFINE AUTOCOVER) 30G X 8 MM MISC USE TID. 100 each 1  . Lancets (ACCU-CHEK SOFT TOUCH) lancets Use to check blood sugar 3x per day. DX: E11.8 Accu Check 300 each 3  . levocetirizine (XYZAL) 5 MG tablet Take 1 tablet (5 mg total) by mouth every evening. 90 tablet 1  . losartan (COZAAR) 25 MG tablet Take 0.5 tablets (12.5 mg total) by mouth  daily. 45 tablet 1  . Magnesium Oxide 400 (240 Mg) MG TABS Take 1 tablet (400 mg total) by mouth 2 (two) times daily. 90 tablet 1  . metoCLOPramide (REGLAN) 5 MG tablet TAKE 1 TABLET BY MOUTH 3 TIMES DAILY BEFORE MEALS. 90 tablet 3  . Multiple Vitamin (MULTIVITAMIN) tablet Take 1 tablet by mouth daily.    . nitroGLYCERIN (NITROSTAT) 0.4 MG SL tablet Place 1 tablet (0.4 mg total) under the tongue every 5 (five) minutes as needed for chest pain. Max 3 doses. 10 tablet 0  . pantoprazole (PROTONIX) 40 MG tablet Take 1 tablet (40 mg total) by mouth daily. 90 tablet 1  . polyethylene glycol (MIRALAX) packet Take 17 g by mouth daily. 14 each 0  . potassium chloride SA (K-DUR) 20 MEQ tablet Take 1 tablet (20 mEq total) by mouth 2 (two) times daily. 180 tablet 1  . rivaroxaban (XARELTO) 20 MG TABS tablet Take 1 tablet (20 mg total) by mouth daily. 90 tablet 1  . simvastatin (ZOCOR) 20 MG tablet Take 1 tablet (20 mg total) by mouth every morning. 90 tablet 1  . SYRINGE-NEEDLE, DISP, 3 ML (B-D SYRINGE/NEEDLE 3CC/25GX5/8) 25G X 5/8" 3 ML MISC Use to inject b12 every month 50 each 0  . torsemide (DEMADEX) 20 MG tablet Take 2 tablets (40 mg total)  by mouth 2 (two) times daily. (Patient taking differently: Take 40 mg by mouth. Take 2 tablets (58m) in AM and 1 tablet (271min PM).) 180 tablet 1  . Vilazodone HCl (VIIBRYD) 40 MG TABS Take 1 tablet (40 mg total) by mouth daily. 90 tablet 1   No facility-administered medications prior to visit.     ROS Review of Systems  Constitutional: Negative for diaphoresis, fatigue and unexpected weight change.  HENT: Negative.   Eyes: Negative for visual disturbance.  Respiratory: Negative for cough, chest tightness, shortness of breath and wheezing.   Cardiovascular: Negative for palpitations and leg swelling.  Gastrointestinal: Negative for abdominal pain, constipation, diarrhea, nausea and vomiting.  Endocrine: Negative.  Negative for polyuria.  Genitourinary:  Negative.  Negative for difficulty urinating.  Musculoskeletal: Negative for arthralgias and myalgias.  Skin: Negative.  Negative for color change and pallor.  Neurological: Negative.  Negative for dizziness, weakness and light-headedness.  Hematological: Negative for adenopathy. Does not bruise/bleed easily.  Psychiatric/Behavioral: Negative.     Objective:  BP (!) 148/68 (BP Location: Left Arm, Patient Position: Sitting, Cuff Size: Large)   Pulse 78   Temp 98.2 F (36.8 C) (Oral)   Resp 16   Ht 5' 9"  (1.753 m)   Wt 266 lb (120.7 kg)   SpO2 97%   BMI 39.28 kg/m   BP Readings from Last 3 Encounters:  03/24/19 (!) 148/68  02/16/19 140/63  01/21/19 136/60    Wt Readings from Last 3 Encounters:  03/24/19 266 lb (120.7 kg)  02/16/19 260 lb (117.9 kg)  01/21/19 268 lb 3 oz (121.6 kg)    Physical Exam Vitals signs reviewed.  Constitutional:      Appearance: He is obese. He is not ill-appearing.  HENT:     Nose: Nose normal.     Mouth/Throat:     Mouth: Mucous membranes are moist.  Eyes:     General: No scleral icterus.    Conjunctiva/sclera: Conjunctivae normal.  Neck:     Musculoskeletal: Normal range of motion. No neck rigidity.  Cardiovascular:     Rate and Rhythm: Normal rate and regular rhythm.     Heart sounds: No murmur. No gallop.   Pulmonary:     Effort: Pulmonary effort is normal.     Breath sounds: No stridor. No wheezing, rhonchi or rales.  Abdominal:     General: Abdomen is protuberant. Bowel sounds are normal. There is no distension.     Palpations: There is no hepatomegaly or splenomegaly.  Musculoskeletal: Normal range of motion.     Right lower leg: No edema.     Left lower leg: No edema.  Skin:    General: Skin is warm and dry.     Coloration: Skin is not pale.  Neurological:     General: No focal deficit present.     Mental Status: He is alert.  Psychiatric:        Mood and Affect: Mood normal.        Behavior: Behavior normal.     Lab  Results  Component Value Date   WBC 6.3 12/31/2018   HGB 13.4 12/31/2018   HCT 41.1 12/31/2018   PLT 132.0 (L) 12/31/2018   GLUCOSE 161 (H) 03/24/2019   CHOL 121 03/24/2019   TRIG 181.0 (H) 03/24/2019   HDL 34.20 (L) 03/24/2019   LDLDIRECT 84.2 01/31/2014   LDLCALC 51 03/24/2019   ALT 17 06/09/2018   AST 28 06/09/2018   NA 141 03/24/2019  K 4.5 03/24/2019   CL 100 03/24/2019   CREATININE 1.28 03/24/2019   BUN 23 03/24/2019   CO2 33 (H) 03/24/2019   TSH 1.54 07/22/2018   PSA 0.36 05/30/2014   INR 2.61 02/10/2017   HGBA1C 7.4 (H) 03/24/2019   MICROALBUR 1.7 07/22/2018    Dg Facial Bones Complete  Result Date: 01/13/2019 CLINICAL DATA:  Status post fall with left facial pain. EXAM: FACIAL BONES COMPLETE 3+V COMPARISON:  None. FINDINGS: There is no evidence of fracture or dislocation. No orbital emphysema or sinus air-fluid levels are seen. IMPRESSION: No definite acute fracture or dislocation noted. Electronically Signed   By: Abelardo Diesel M.D.   On: 01/13/2019 14:59   Dg Chest 2 View  Result Date: 01/13/2019 CLINICAL DATA:  Status post fall 5 days ago with left chest pain. EXAM: CHEST - 2 VIEW COMPARISON:  August 05, 2018 FINDINGS: The heart size and mediastinal contours are stable. No focal infiltrate, pulmonary edema, or pleural effusion is identified. There is no pneumothorax. There are possible deformities of several lateral left mid ribs fractures are not excluded. IMPRESSION: Possible deformities of several lateral left mid ribs, fracture is not excluded. Further evaluation with a left rib series is recommended. Lungs are clear.  There is no pneumothorax. Electronically Signed   By: Abelardo Diesel M.D.   On: 01/13/2019 14:54   Dg Knee Complete 4 Views Left  Result Date: 01/13/2019 CLINICAL DATA:  Status post fall five days ago with left knee pain. EXAM: LEFT KNEE - COMPLETE 4+ VIEW COMPARISON:  None. FINDINGS: No evidence of fracture, dislocation, or joint effusion. Narrow  femoral tibial joint space with osteophyte formation are noted. Soft tissues are unremarkable. IMPRESSION: Osteoarthritic changes of left knee. Electronically Signed   By: Abelardo Diesel M.D.   On: 01/13/2019 14:57    Assessment & Plan:   Panfilo was seen today for hypertension, hyperlipidemia and diabetes.  Diagnoses and all orders for this visit:  Essential hypertension, malignant- His blood pressure is adequately well controlled. -     Basic metabolic panel; Future  Type II diabetes mellitus with manifestations ([place])- His A1c is down to 7.4%.  He has achieved adequate glycemic control. -     Hemoglobin A1c; Future -     Basic metabolic panel; Future  Vitamin B12 deficiency anemia due to intrinsic factor deficiency -     Folate; Future -     cyanocobalamin ((VITAMIN B-12)) injection 1,000 mcg  Hyperlipidemia with target LDL less than 70- He has achieved his LDL goal is doing well on the statin. -     Lipid panel; Future  Need for influenza vaccination -     Flu Vaccine QUAD High Dose(Fluad)   I am having Jas Hanford "Shanon Brow" maintain his nitroGLYCERIN, polyethylene glycol, acetaminophen, multivitamin, Farxiga, Trulicity, fluticasone, Trelegy Ellipta, insulin aspart, Tresiba FlexTouch, levocetirizine, metoCLOPramide, pantoprazole, rivaroxaban, potassium chloride SA, simvastatin, Viibryd, Magnesium Oxide, losartan, carvedilol, torsemide, B-D SYRINGE/NEEDLE 3CC/25GX5/8, NovoFine Autocover, blood glucose meter kit and supplies, Cool Blood Glucose Test Strips, and accu-chek soft touch. We administered cyanocobalamin.  Meds ordered this encounter  Medications  . cyanocobalamin ((VITAMIN B-12)) injection 1,000 mcg     Follow-up: Return in about 6 months (around 09/21/2019).  Scarlette Calico, MD

## 2019-03-24 NOTE — Patient Instructions (Signed)
Type 2 Diabetes Mellitus, Diagnosis, Adult Type 2 diabetes (type 2 diabetes mellitus) is a long-term (chronic) disease. In type 2 diabetes, one or both of these problems may be present:  The pancreas does not make enough of a hormone called insulin.  Cells in the body do not respond properly to insulin that the body makes (insulin resistance). Normally, insulin allows blood sugar (glucose) to enter cells in the body. The cells use glucose for energy. Insulin resistance or lack of insulin causes excess glucose to build up in the blood instead of going into cells. As a result, high blood glucose (hyperglycemia) develops. What increases the risk? The following factors may make you more likely to develop type 2 diabetes:  Having a family member with type 2 diabetes.  Being overweight or obese.  Having an inactive (sedentary) lifestyle.  Having been diagnosed with insulin resistance.  Having a history of prediabetes, gestational diabetes, or polycystic ovary syndrome (PCOS).  Being of American-Indian, African-American, Hispanic/Latino, or Asian/Pacific Islander descent. What are the signs or symptoms? In the early stage of this condition, you may not have symptoms. Symptoms develop slowly and may include:  Increased thirst (polydipsia).  Increased hunger(polyphagia).  Increased urination (polyuria).  Increased urination during the night (nocturia).  Unexplained weight loss.  Frequent infections that keep coming back (recurring).  Fatigue.  Weakness.  Vision changes, such as blurry vision.  Cuts or bruises that are slow to heal.  Tingling or numbness in the hands or feet.  Dark patches on the skin (acanthosis nigricans). How is this diagnosed? This condition is diagnosed based on your symptoms, your medical history, a physical exam, and your blood glucose level. Your blood glucose may be checked with one or more of the following blood tests:  A fasting blood glucose (FBG)  test. You will not be allowed to eat (you will fast) for 8 hours or longer before a blood sample is taken.  A random blood glucose test. This test checks blood glucose at any time of day regardless of when you ate.  An A1c (hemoglobin A1c) blood test. This test provides information about blood glucose control over the previous 2-3 months.  An oral glucose tolerance test (OGTT). This test measures your blood glucose at two times: ? After fasting. This is your baseline blood glucose level. ? Two hours after drinking a beverage that contains glucose. You may be diagnosed with type 2 diabetes if:  Your FBG level is 126 mg/dL (7.0 mmol/L) or higher.  Your random blood glucose level is 200 mg/dL (11.1 mmol/L) or higher.  Your A1c level is 6.5% or higher.  Your OGTT result is higher than 200 mg/dL (11.1 mmol/L). These blood tests may be repeated to confirm your diagnosis. How is this treated? Your treatment may be managed by a specialist called an endocrinologist. Type 2 diabetes may be treated by following instructions from your health care provider about:  Making diet and lifestyle changes. This may include: ? Following an individualized nutrition plan that is developed by a diet and nutrition specialist (registered dietitian). ? Exercising regularly. ? Finding ways to manage stress.  Checking your blood glucose level as often as told.  Taking diabetes medicines or insulin daily. This helps to keep your blood glucose levels in the healthy range. ? If you use insulin, you may need to adjust the dosage depending on how physically active you are and what foods you eat. Your health care provider will tell you how to adjust your dosage.    Taking medicines to help prevent complications from diabetes, such as: ? Aspirin. ? Medicine to lower cholesterol. ? Medicine to control blood pressure. Your health care provider will set individualized treatment goals for you. Your goals will be based on  your age, other medical conditions you have, and how you respond to diabetes treatment. Generally, the goal of treatment is to maintain the following blood glucose levels:  Before meals (preprandial): 80-130 mg/dL (4.4-7.2 mmol/L).  After meals (postprandial): below 180 mg/dL (10 mmol/L).  A1c level: less than 7%. Follow these instructions at home: Questions to ask your health care provider  Consider asking the following questions: ? Do I need to meet with a diabetes educator? ? Where can I find a support group for people with diabetes? ? What equipment will I need to manage my diabetes at home? ? What diabetes medicines do I need, and when should I take them? ? How often do I need to check my blood glucose? ? What number can I call if I have questions? ? When is my next appointment? General instructions  Take over-the-counter and prescription medicines only as told by your health care provider.  Keep all follow-up visits as told by your health care provider. This is important.  For more information about diabetes, visit: ? American Diabetes Association (ADA): www.diabetes.org ? American Association of Diabetes Educators (AADE): www.diabeteseducator.org Contact a health care provider if:  Your blood glucose is at or above 240 mg/dL (13.3 mmol/L) for 2 days in a row.  You have been sick or have had a fever for 2 days or longer, and you are not getting better.  You have any of the following problems for more than 6 hours: ? You cannot eat or drink. ? You have nausea and vomiting. ? You have diarrhea. Get help right away if:  Your blood glucose is lower than 54 mg/dL (3.0 mmol/L).  You become confused or you have trouble thinking clearly.  You have difficulty breathing.  You have moderate or large ketone levels in your urine. Summary  Type 2 diabetes (type 2 diabetes mellitus) is a long-term (chronic) disease. In type 2 diabetes, the pancreas does not make enough of a  hormone called insulin, or cells in the body do not respond properly to insulin that the body makes (insulin resistance).  This condition is treated by making diet and lifestyle changes and taking diabetes medicines or insulin.  Your health care provider will set individualized treatment goals for you. Your goals will be based on your age, other medical conditions you have, and how you respond to diabetes treatment.  Keep all follow-up visits as told by your health care provider. This is important. This information is not intended to replace advice given to you by your health care provider. Make sure you discuss any questions you have with your health care provider. Document Released: 06/24/2005 Document Revised: 08/22/2017 Document Reviewed: 07/28/2015 Elsevier Patient Education  2020 [employer]  

## 2019-04-02 ENCOUNTER — Encounter: Payer: Self-pay | Admitting: Internal Medicine

## 2019-05-06 ENCOUNTER — Ambulatory Visit: Payer: Medicare Other | Admitting: Internal Medicine

## 2019-05-06 ENCOUNTER — Emergency Department (HOSPITAL_COMMUNITY): Payer: Medicare Other

## 2019-05-06 ENCOUNTER — Other Ambulatory Visit: Payer: Self-pay

## 2019-05-06 ENCOUNTER — Encounter (HOSPITAL_COMMUNITY): Payer: Self-pay

## 2019-05-06 ENCOUNTER — Inpatient Hospital Stay (HOSPITAL_COMMUNITY)
Admission: EM | Admit: 2019-05-06 | Discharge: 2019-06-18 | DRG: 459 | Disposition: A | Discharge status: Skilled Nursing Facility | Payer: Medicare Other | Attending: Internal Medicine | Admitting: Internal Medicine

## 2019-05-06 DIAGNOSIS — I509 Heart failure, unspecified: Secondary | ICD-10-CM | POA: Diagnosis not present

## 2019-05-06 DIAGNOSIS — M456 Ankylosing spondylitis lumbar region: Secondary | ICD-10-CM | POA: Diagnosis present

## 2019-05-06 DIAGNOSIS — R059 Cough, unspecified: Secondary | ICD-10-CM

## 2019-05-06 DIAGNOSIS — R0902 Hypoxemia: Secondary | ICD-10-CM | POA: Diagnosis not present

## 2019-05-06 DIAGNOSIS — I5022 Chronic systolic (congestive) heart failure: Secondary | ICD-10-CM

## 2019-05-06 DIAGNOSIS — R1312 Dysphagia, oropharyngeal phase: Secondary | ICD-10-CM | POA: Diagnosis present

## 2019-05-06 DIAGNOSIS — D6959 Other secondary thrombocytopenia: Secondary | ICD-10-CM | POA: Diagnosis present

## 2019-05-06 DIAGNOSIS — J9690 Respiratory failure, unspecified, unspecified whether with hypoxia or hypercapnia: Secondary | ICD-10-CM

## 2019-05-06 DIAGNOSIS — R109 Unspecified abdominal pain: Secondary | ICD-10-CM

## 2019-05-06 DIAGNOSIS — R008 Other abnormalities of heart beat: Secondary | ICD-10-CM | POA: Diagnosis present

## 2019-05-06 DIAGNOSIS — E872 Acidosis: Secondary | ICD-10-CM | POA: Diagnosis not present

## 2019-05-06 DIAGNOSIS — G9341 Metabolic encephalopathy: Secondary | ICD-10-CM | POA: Diagnosis not present

## 2019-05-06 DIAGNOSIS — G4733 Obstructive sleep apnea (adult) (pediatric): Secondary | ICD-10-CM | POA: Diagnosis present

## 2019-05-06 DIAGNOSIS — Z8041 Family history of malignant neoplasm of ovary: Secondary | ICD-10-CM

## 2019-05-06 DIAGNOSIS — S32028A Other fracture of second lumbar vertebra, initial encounter for closed fracture: Secondary | ICD-10-CM | POA: Diagnosis not present

## 2019-05-06 DIAGNOSIS — M25551 Pain in right hip: Secondary | ICD-10-CM | POA: Diagnosis not present

## 2019-05-06 DIAGNOSIS — I130 Hypertensive heart and chronic kidney disease with heart failure and stage 1 through stage 4 chronic kidney disease, or unspecified chronic kidney disease: Secondary | ICD-10-CM | POA: Diagnosis not present

## 2019-05-06 DIAGNOSIS — J441 Chronic obstructive pulmonary disease with (acute) exacerbation: Secondary | ICD-10-CM | POA: Diagnosis not present

## 2019-05-06 DIAGNOSIS — I4891 Unspecified atrial fibrillation: Secondary | ICD-10-CM | POA: Diagnosis not present

## 2019-05-06 DIAGNOSIS — E871 Hypo-osmolality and hyponatremia: Secondary | ICD-10-CM

## 2019-05-06 DIAGNOSIS — R0600 Dyspnea, unspecified: Secondary | ICD-10-CM | POA: Diagnosis not present

## 2019-05-06 DIAGNOSIS — R05 Cough: Secondary | ICD-10-CM | POA: Diagnosis not present

## 2019-05-06 DIAGNOSIS — Z7901 Long term (current) use of anticoagulants: Secondary | ICD-10-CM

## 2019-05-06 DIAGNOSIS — R918 Other nonspecific abnormal finding of lung field: Secondary | ICD-10-CM | POA: Diagnosis not present

## 2019-05-06 DIAGNOSIS — U071 COVID-19: Secondary | ICD-10-CM | POA: Diagnosis present

## 2019-05-06 DIAGNOSIS — J9622 Acute and chronic respiratory failure with hypercapnia: Secondary | ICD-10-CM | POA: Diagnosis not present

## 2019-05-06 DIAGNOSIS — W1809XA Striking against other object with subsequent fall, initial encounter: Secondary | ICD-10-CM | POA: Diagnosis present

## 2019-05-06 DIAGNOSIS — E46 Unspecified protein-calorie malnutrition: Secondary | ICD-10-CM | POA: Diagnosis not present

## 2019-05-06 DIAGNOSIS — Z87891 Personal history of nicotine dependence: Secondary | ICD-10-CM

## 2019-05-06 DIAGNOSIS — R7881 Bacteremia: Secondary | ICD-10-CM | POA: Diagnosis not present

## 2019-05-06 DIAGNOSIS — Z888 Allergy status to other drugs, medicaments and biological substances status: Secondary | ICD-10-CM

## 2019-05-06 DIAGNOSIS — F419 Anxiety disorder, unspecified: Secondary | ICD-10-CM | POA: Diagnosis present

## 2019-05-06 DIAGNOSIS — Z8261 Family history of arthritis: Secondary | ICD-10-CM

## 2019-05-06 DIAGNOSIS — B954 Other streptococcus as the cause of diseases classified elsewhere: Secondary | ICD-10-CM | POA: Diagnosis not present

## 2019-05-06 DIAGNOSIS — R1310 Dysphagia, unspecified: Secondary | ICD-10-CM | POA: Diagnosis present

## 2019-05-06 DIAGNOSIS — Z981 Arthrodesis status: Secondary | ICD-10-CM | POA: Diagnosis not present

## 2019-05-06 DIAGNOSIS — S32020A Wedge compression fracture of second lumbar vertebra, initial encounter for closed fracture: Secondary | ICD-10-CM | POA: Diagnosis not present

## 2019-05-06 DIAGNOSIS — Z452 Encounter for adjustment and management of vascular access device: Secondary | ICD-10-CM

## 2019-05-06 DIAGNOSIS — Z79899 Other long term (current) drug therapy: Secondary | ICD-10-CM

## 2019-05-06 DIAGNOSIS — J440 Chronic obstructive pulmonary disease with acute lower respiratory infection: Secondary | ICD-10-CM | POA: Diagnosis present

## 2019-05-06 DIAGNOSIS — I110 Hypertensive heart disease with heart failure: Secondary | ICD-10-CM | POA: Diagnosis present

## 2019-05-06 DIAGNOSIS — K219 Gastro-esophageal reflux disease without esophagitis: Secondary | ICD-10-CM | POA: Diagnosis present

## 2019-05-06 DIAGNOSIS — S59911A Unspecified injury of right forearm, initial encounter: Secondary | ICD-10-CM | POA: Diagnosis not present

## 2019-05-06 DIAGNOSIS — K9429 Other complications of gastrostomy: Secondary | ICD-10-CM

## 2019-05-06 DIAGNOSIS — I517 Cardiomegaly: Secondary | ICD-10-CM | POA: Diagnosis not present

## 2019-05-06 DIAGNOSIS — I959 Hypotension, unspecified: Secondary | ICD-10-CM | POA: Diagnosis not present

## 2019-05-06 DIAGNOSIS — Z419 Encounter for procedure for purposes other than remedying health state, unspecified: Secondary | ICD-10-CM

## 2019-05-06 DIAGNOSIS — N179 Acute kidney failure, unspecified: Secondary | ICD-10-CM | POA: Diagnosis not present

## 2019-05-06 DIAGNOSIS — N189 Chronic kidney disease, unspecified: Secondary | ICD-10-CM | POA: Diagnosis not present

## 2019-05-06 DIAGNOSIS — S3993XA Unspecified injury of pelvis, initial encounter: Secondary | ICD-10-CM | POA: Diagnosis not present

## 2019-05-06 DIAGNOSIS — S32012A Unstable burst fracture of first lumbar vertebra, initial encounter for closed fracture: Secondary | ICD-10-CM | POA: Diagnosis not present

## 2019-05-06 DIAGNOSIS — S32002A Unstable burst fracture of unspecified lumbar vertebra, initial encounter for closed fracture: Secondary | ICD-10-CM | POA: Diagnosis not present

## 2019-05-06 DIAGNOSIS — D649 Anemia, unspecified: Secondary | ICD-10-CM | POA: Diagnosis not present

## 2019-05-06 DIAGNOSIS — S32010A Wedge compression fracture of first lumbar vertebra, initial encounter for closed fracture: Secondary | ICD-10-CM | POA: Diagnosis not present

## 2019-05-06 DIAGNOSIS — W19XXXA Unspecified fall, initial encounter: Secondary | ICD-10-CM

## 2019-05-06 DIAGNOSIS — K9422 Gastrostomy infection: Secondary | ICD-10-CM

## 2019-05-06 DIAGNOSIS — Z91048 Other nonmedicinal substance allergy status: Secondary | ICD-10-CM | POA: Diagnosis not present

## 2019-05-06 DIAGNOSIS — S299XXA Unspecified injury of thorax, initial encounter: Secondary | ICD-10-CM | POA: Diagnosis not present

## 2019-05-06 DIAGNOSIS — R509 Fever, unspecified: Secondary | ICD-10-CM | POA: Diagnosis not present

## 2019-05-06 DIAGNOSIS — E1165 Type 2 diabetes mellitus with hyperglycemia: Secondary | ICD-10-CM | POA: Diagnosis not present

## 2019-05-06 DIAGNOSIS — R079 Chest pain, unspecified: Secondary | ICD-10-CM | POA: Diagnosis not present

## 2019-05-06 DIAGNOSIS — E785 Hyperlipidemia, unspecified: Secondary | ICD-10-CM | POA: Diagnosis present

## 2019-05-06 DIAGNOSIS — E118 Type 2 diabetes mellitus with unspecified complications: Secondary | ICD-10-CM | POA: Diagnosis not present

## 2019-05-06 DIAGNOSIS — J1289 Other viral pneumonia: Secondary | ICD-10-CM | POA: Diagnosis not present

## 2019-05-06 DIAGNOSIS — J9611 Chronic respiratory failure with hypoxia: Secondary | ICD-10-CM | POA: Diagnosis present

## 2019-05-06 DIAGNOSIS — Z801 Family history of malignant neoplasm of trachea, bronchus and lung: Secondary | ICD-10-CM

## 2019-05-06 DIAGNOSIS — R488 Other symbolic dysfunctions: Secondary | ICD-10-CM | POA: Diagnosis present

## 2019-05-06 DIAGNOSIS — E119 Type 2 diabetes mellitus without complications: Secondary | ICD-10-CM | POA: Diagnosis not present

## 2019-05-06 DIAGNOSIS — S32022A Unstable burst fracture of second lumbar vertebra, initial encounter for closed fracture: Secondary | ICD-10-CM | POA: Diagnosis present

## 2019-05-06 DIAGNOSIS — J189 Pneumonia, unspecified organism: Secondary | ICD-10-CM | POA: Diagnosis not present

## 2019-05-06 DIAGNOSIS — Y838 Other surgical procedures as the cause of abnormal reaction of the patient, or of later complication, without mention of misadventure at the time of the procedure: Secondary | ICD-10-CM | POA: Diagnosis not present

## 2019-05-06 DIAGNOSIS — I5042 Chronic combined systolic (congestive) and diastolic (congestive) heart failure: Secondary | ICD-10-CM | POA: Diagnosis present

## 2019-05-06 DIAGNOSIS — R0689 Other abnormalities of breathing: Secondary | ICD-10-CM | POA: Diagnosis not present

## 2019-05-06 DIAGNOSIS — R52 Pain, unspecified: Secondary | ICD-10-CM | POA: Diagnosis not present

## 2019-05-06 DIAGNOSIS — M545 Low back pain: Secondary | ICD-10-CM | POA: Diagnosis not present

## 2019-05-06 DIAGNOSIS — I2510 Atherosclerotic heart disease of native coronary artery without angina pectoris: Secondary | ICD-10-CM | POA: Diagnosis present

## 2019-05-06 DIAGNOSIS — K5900 Constipation, unspecified: Secondary | ICD-10-CM | POA: Diagnosis not present

## 2019-05-06 DIAGNOSIS — Z833 Family history of diabetes mellitus: Secondary | ICD-10-CM

## 2019-05-06 DIAGNOSIS — I482 Chronic atrial fibrillation, unspecified: Secondary | ICD-10-CM | POA: Diagnosis not present

## 2019-05-06 DIAGNOSIS — I472 Ventricular tachycardia: Secondary | ICD-10-CM | POA: Diagnosis not present

## 2019-05-06 DIAGNOSIS — S32000A Wedge compression fracture of unspecified lumbar vertebra, initial encounter for closed fracture: Secondary | ICD-10-CM

## 2019-05-06 DIAGNOSIS — Z794 Long term (current) use of insulin: Secondary | ICD-10-CM

## 2019-05-06 DIAGNOSIS — S32009A Unspecified fracture of unspecified lumbar vertebra, initial encounter for closed fracture: Secondary | ICD-10-CM | POA: Diagnosis not present

## 2019-05-06 DIAGNOSIS — S36892A Contusion of other intra-abdominal organs, initial encounter: Secondary | ICD-10-CM | POA: Diagnosis present

## 2019-05-06 DIAGNOSIS — J301 Allergic rhinitis due to pollen: Secondary | ICD-10-CM

## 2019-05-06 DIAGNOSIS — M6281 Muscle weakness (generalized): Secondary | ICD-10-CM | POA: Diagnosis present

## 2019-05-06 DIAGNOSIS — K9423 Gastrostomy malfunction: Secondary | ICD-10-CM

## 2019-05-06 DIAGNOSIS — T8131XD Disruption of external operation (surgical) wound, not elsewhere classified, subsequent encounter: Secondary | ICD-10-CM | POA: Diagnosis not present

## 2019-05-06 DIAGNOSIS — I4729 Other ventricular tachycardia: Secondary | ICD-10-CM

## 2019-05-06 DIAGNOSIS — J9621 Acute and chronic respiratory failure with hypoxia: Secondary | ICD-10-CM | POA: Diagnosis not present

## 2019-05-06 DIAGNOSIS — J811 Chronic pulmonary edema: Secondary | ICD-10-CM | POA: Diagnosis not present

## 2019-05-06 DIAGNOSIS — J9602 Acute respiratory failure with hypercapnia: Secondary | ICD-10-CM | POA: Diagnosis not present

## 2019-05-06 DIAGNOSIS — Z01818 Encounter for other preprocedural examination: Secondary | ICD-10-CM | POA: Diagnosis not present

## 2019-05-06 DIAGNOSIS — Z87442 Personal history of urinary calculi: Secondary | ICD-10-CM

## 2019-05-06 DIAGNOSIS — Z01810 Encounter for preprocedural cardiovascular examination: Secondary | ICD-10-CM | POA: Diagnosis not present

## 2019-05-06 DIAGNOSIS — Z741 Need for assistance with personal care: Secondary | ICD-10-CM | POA: Diagnosis present

## 2019-05-06 DIAGNOSIS — R1313 Dysphagia, pharyngeal phase: Secondary | ICD-10-CM | POA: Diagnosis not present

## 2019-05-06 DIAGNOSIS — Z4682 Encounter for fitting and adjustment of non-vascular catheter: Secondary | ICD-10-CM | POA: Diagnosis not present

## 2019-05-06 DIAGNOSIS — T8131XA Disruption of external operation (surgical) wound, not elsewhere classified, initial encounter: Secondary | ICD-10-CM | POA: Diagnosis not present

## 2019-05-06 DIAGNOSIS — I872 Venous insufficiency (chronic) (peripheral): Secondary | ICD-10-CM | POA: Diagnosis present

## 2019-05-06 DIAGNOSIS — S32019D Unspecified fracture of first lumbar vertebra, subsequent encounter for fracture with routine healing: Secondary | ICD-10-CM | POA: Diagnosis not present

## 2019-05-06 DIAGNOSIS — Z953 Presence of xenogenic heart valve: Secondary | ICD-10-CM

## 2019-05-06 DIAGNOSIS — M79631 Pain in right forearm: Secondary | ICD-10-CM | POA: Diagnosis not present

## 2019-05-06 DIAGNOSIS — T17908A Unspecified foreign body in respiratory tract, part unspecified causing other injury, initial encounter: Secondary | ICD-10-CM

## 2019-05-06 DIAGNOSIS — R7989 Other specified abnormal findings of blood chemistry: Secondary | ICD-10-CM | POA: Diagnosis not present

## 2019-05-06 DIAGNOSIS — S32012G Unstable burst fracture of first lumbar vertebra, subsequent encounter for fracture with delayed healing: Secondary | ICD-10-CM | POA: Diagnosis not present

## 2019-05-06 DIAGNOSIS — Z8249 Family history of ischemic heart disease and other diseases of the circulatory system: Secondary | ICD-10-CM

## 2019-05-06 DIAGNOSIS — S32012K Unstable burst fracture of first lumbar vertebra, subsequent encounter for fracture with nonunion: Secondary | ICD-10-CM | POA: Diagnosis not present

## 2019-05-06 DIAGNOSIS — Y95 Nosocomial condition: Secondary | ICD-10-CM | POA: Diagnosis not present

## 2019-05-06 DIAGNOSIS — Z6839 Body mass index (BMI) 39.0-39.9, adult: Secondary | ICD-10-CM

## 2019-05-06 DIAGNOSIS — R2689 Other abnormalities of gait and mobility: Secondary | ICD-10-CM | POA: Diagnosis present

## 2019-05-06 DIAGNOSIS — K661 Hemoperitoneum: Secondary | ICD-10-CM | POA: Diagnosis not present

## 2019-05-06 DIAGNOSIS — L89152 Pressure ulcer of sacral region, stage 2: Secondary | ICD-10-CM | POA: Diagnosis present

## 2019-05-06 DIAGNOSIS — I4510 Unspecified right bundle-branch block: Secondary | ICD-10-CM | POA: Diagnosis present

## 2019-05-06 DIAGNOSIS — Y92009 Unspecified place in unspecified non-institutional (private) residence as the place of occurrence of the external cause: Secondary | ICD-10-CM | POA: Diagnosis not present

## 2019-05-06 DIAGNOSIS — L8990 Pressure ulcer of unspecified site, unspecified stage: Secondary | ICD-10-CM | POA: Insufficient documentation

## 2019-05-06 DIAGNOSIS — K5730 Diverticulosis of large intestine without perforation or abscess without bleeding: Secondary | ICD-10-CM | POA: Diagnosis not present

## 2019-05-06 DIAGNOSIS — M4327 Fusion of spine, lumbosacral region: Secondary | ICD-10-CM | POA: Diagnosis present

## 2019-05-06 DIAGNOSIS — I493 Ventricular premature depolarization: Secondary | ICD-10-CM | POA: Diagnosis present

## 2019-05-06 DIAGNOSIS — J449 Chronic obstructive pulmonary disease, unspecified: Secondary | ICD-10-CM | POA: Diagnosis present

## 2019-05-06 DIAGNOSIS — Z811 Family history of alcohol abuse and dependence: Secondary | ICD-10-CM

## 2019-05-06 DIAGNOSIS — I447 Left bundle-branch block, unspecified: Secondary | ICD-10-CM | POA: Diagnosis present

## 2019-05-06 DIAGNOSIS — S32018A Other fracture of first lumbar vertebra, initial encounter for closed fracture: Secondary | ICD-10-CM | POA: Diagnosis not present

## 2019-05-06 DIAGNOSIS — I10 Essential (primary) hypertension: Secondary | ICD-10-CM

## 2019-05-06 DIAGNOSIS — R339 Retention of urine, unspecified: Secondary | ICD-10-CM | POA: Diagnosis not present

## 2019-05-06 DIAGNOSIS — Z952 Presence of prosthetic heart valve: Secondary | ICD-10-CM | POA: Diagnosis not present

## 2019-05-06 DIAGNOSIS — Z96641 Presence of right artificial hip joint: Secondary | ICD-10-CM | POA: Diagnosis present

## 2019-05-06 DIAGNOSIS — R1319 Other dysphagia: Secondary | ICD-10-CM | POA: Diagnosis present

## 2019-05-06 DIAGNOSIS — E875 Hyperkalemia: Secondary | ICD-10-CM | POA: Diagnosis not present

## 2019-05-06 LAB — BASIC METABOLIC PANEL
Anion gap: 10 (ref 5–15)
BUN: 19 mg/dL (ref 8–23)
CO2: 25 mmol/L (ref 22–32)
Calcium: 8.5 mg/dL — ABNORMAL LOW (ref 8.9–10.3)
Chloride: 102 mmol/L (ref 98–111)
Creatinine, Ser: 1.31 mg/dL — ABNORMAL HIGH (ref 0.61–1.24)
GFR calc Af Amer: >60 mL/min (ref >60)
GFR calc non Af Amer: 56 mL/min — ABNORMAL LOW (ref >60)
Glucose, Bld: 211 mg/dL — ABNORMAL HIGH (ref 70–99)
Potassium: 3.7 mmol/L (ref 3.5–5.1)
Sodium: 137 mmol/L (ref 135–145)

## 2019-05-06 LAB — CBC WITH DIFFERENTIAL/PLATELET
Abs Immature Granulocytes: 0.05 10*3/uL (ref 0.00–0.07)
Basophils Absolute: 0 10*3/uL (ref 0.0–0.1)
Basophils Relative: 0 %
Eosinophils Absolute: 0 10*3/uL (ref 0.0–0.5)
Eosinophils Relative: 0 %
HCT: 45.5 % (ref 39.0–52.0)
Hemoglobin: 13.8 g/dL (ref 13.0–17.0)
Immature Granulocytes: 0 %
Lymphocytes Relative: 3 %
Lymphs Abs: 0.4 10*3/uL — ABNORMAL LOW (ref 0.7–4.0)
MCH: 27.4 pg (ref 26.0–34.0)
MCHC: 30.3 g/dL (ref 30.0–36.0)
MCV: 90.3 fL (ref 80.0–100.0)
Monocytes Absolute: 0.8 10*3/uL (ref 0.1–1.0)
Monocytes Relative: 7 %
Neutro Abs: 10.7 10*3/uL — ABNORMAL HIGH (ref 1.7–7.7)
Neutrophils Relative %: 90 %
Platelets: 104 10*3/uL — ABNORMAL LOW (ref 150–400)
RBC: 5.04 MIL/uL (ref 4.22–5.81)
RDW: 16.1 % — ABNORMAL HIGH (ref 11.5–15.5)
WBC: 11.9 10*3/uL — ABNORMAL HIGH (ref 4.0–10.5)
nRBC: 0 % (ref 0.0–0.2)

## 2019-05-06 LAB — COMPREHENSIVE METABOLIC PANEL
ALT: 22 U/L (ref 0–44)
AST: 70 U/L — ABNORMAL HIGH (ref 15–41)
Albumin: 3.4 g/dL — ABNORMAL LOW (ref 3.5–5.0)
Alkaline Phosphatase: 64 U/L (ref 38–126)
Anion gap: 9 (ref 5–15)
BUN: 20 mg/dL (ref 8–23)
CO2: 25 mmol/L (ref 22–32)
Calcium: 8.3 mg/dL — ABNORMAL LOW (ref 8.9–10.3)
Chloride: 103 mmol/L (ref 98–111)
Creatinine, Ser: 1.18 mg/dL (ref 0.61–1.24)
GFR calc Af Amer: >60 mL/min (ref >60)
GFR calc non Af Amer: >60 mL/min (ref >60)
Glucose, Bld: 196 mg/dL — ABNORMAL HIGH (ref 70–99)
Potassium: 3.8 mmol/L (ref 3.5–5.1)
Sodium: 137 mmol/L (ref 135–145)
Total Bilirubin: 1.1 mg/dL (ref 0.3–1.2)
Total Protein: 7.3 g/dL (ref 6.5–8.1)

## 2019-05-06 LAB — PROTIME-INR
INR: 1.2 (ref 0.8–1.2)
Prothrombin Time: 15 seconds (ref 11.4–15.2)

## 2019-05-06 LAB — LACTATE DEHYDROGENASE: LDH: 331 U/L — ABNORMAL HIGH (ref 98–192)

## 2019-05-06 LAB — C-REACTIVE PROTEIN: CRP: 14.7 mg/dL — ABNORMAL HIGH (ref <1.0)

## 2019-05-06 LAB — FERRITIN: Ferritin: 118 ng/mL (ref 24–336)

## 2019-05-06 LAB — BRAIN NATRIURETIC PEPTIDE: B Natriuretic Peptide: 326 pg/mL — ABNORMAL HIGH (ref 0.0–100.0)

## 2019-05-06 LAB — CBG MONITORING, ED: Glucose-Capillary: 181 mg/dL — ABNORMAL HIGH (ref 70–99)

## 2019-05-06 LAB — SARS CORONAVIRUS 2 BY RT PCR (HOSPITAL ORDER, PERFORMED IN ~~LOC~~ HOSPITAL LAB): SARS Coronavirus 2: POSITIVE — AB

## 2019-05-06 LAB — APTT: aPTT: 36 seconds (ref 24–36)

## 2019-05-06 LAB — D-DIMER, QUANTITATIVE: D-Dimer, Quant: >20 ug/mL-FEU — ABNORMAL HIGH (ref 0.00–0.50)

## 2019-05-06 MED ORDER — POTASSIUM CHLORIDE CRYS ER 20 MEQ PO TBCR
20.0000 meq | EXTENDED_RELEASE_TABLET | Freq: Two times a day (BID) | ORAL | Status: DC
  Administered 2019-05-06 – 2019-05-12 (×11): 20 meq via ORAL
  Filled 2019-05-06 (×13): qty 1

## 2019-05-06 MED ORDER — MORPHINE SULFATE (PF) 2 MG/ML IV SOLN
2.0000 mg | INTRAVENOUS | Status: DC | PRN
  Administered 2019-05-07: 2 mg via INTRAVENOUS
  Filled 2019-05-06: qty 1

## 2019-05-06 MED ORDER — ONDANSETRON HCL 4 MG PO TABS: 4.0000 mg | ORAL_TABLET | Freq: Four times a day (QID) | ORAL | Status: DC | PRN

## 2019-05-06 MED ORDER — PANTOPRAZOLE SODIUM 40 MG PO TBEC
40.0000 mg | DELAYED_RELEASE_TABLET | Freq: Every day | ORAL | Status: DC
  Administered 2019-05-08 – 2019-05-21 (×13): 40 mg via ORAL
  Filled 2019-05-06 (×16): qty 1

## 2019-05-06 MED ORDER — LOSARTAN POTASSIUM 25 MG PO TABS
12.5000 mg | ORAL_TABLET | Freq: Every day | ORAL | Status: DC
  Filled 2019-05-06 (×3): qty 0.5

## 2019-05-06 MED ORDER — ACETAMINOPHEN 325 MG PO TABS
650.0000 mg | ORAL_TABLET | Freq: Four times a day (QID) | ORAL | Status: DC | PRN
  Administered 2019-05-11 – 2019-05-20 (×4): 650 mg via ORAL
  Filled 2019-05-06 (×6): qty 2

## 2019-05-06 MED ORDER — OXYCODONE-ACETAMINOPHEN 5-325 MG PO TABS
1.0000 | ORAL_TABLET | Freq: Once | ORAL | Status: AC
  Administered 2019-05-06: 1 via ORAL
  Filled 2019-05-06: qty 1

## 2019-05-06 MED ORDER — HYDRALAZINE HCL 20 MG/ML IJ SOLN
5.0000 mg | Freq: Four times a day (QID) | INTRAMUSCULAR | Status: DC | PRN
  Filled 2019-05-06: qty 1

## 2019-05-06 MED ORDER — LORATADINE 10 MG PO TABS
10.0000 mg | ORAL_TABLET | Freq: Every evening | ORAL | Status: DC
  Administered 2019-05-07 – 2019-05-18 (×8): 10 mg via ORAL
  Filled 2019-05-06 (×10): qty 1

## 2019-05-06 MED ORDER — SENNOSIDES-DOCUSATE SODIUM 8.6-50 MG PO TABS
2.0000 | ORAL_TABLET | Freq: Two times a day (BID) | ORAL | Status: DC
  Administered 2019-05-06 – 2019-05-20 (×23): 2 via ORAL
  Filled 2019-05-06 (×27): qty 2

## 2019-05-06 MED ORDER — ACETAMINOPHEN 650 MG RE SUPP
650.0000 mg | Freq: Four times a day (QID) | RECTAL | Status: DC | PRN
  Administered 2019-05-10: 650 mg via RECTAL
  Filled 2019-05-06 (×2): qty 1

## 2019-05-06 MED ORDER — NITROGLYCERIN 0.4 MG SL SUBL: 0.4000 mg | SUBLINGUAL_TABLET | SUBLINGUAL | Status: DC | PRN

## 2019-05-06 MED ORDER — HYDROMORPHONE HCL 1 MG/ML IJ SOLN
1.0000 mg | Freq: Once | INTRAMUSCULAR | Status: AC
  Administered 2019-05-06: 1 mg via INTRAVENOUS
  Filled 2019-05-06: qty 1

## 2019-05-06 MED ORDER — OXYCODONE HCL 5 MG PO TABS
5.0000 mg | ORAL_TABLET | ORAL | Status: DC | PRN
  Administered 2019-05-07 – 2019-05-21 (×12): 5 mg via ORAL
  Filled 2019-05-06 (×14): qty 1

## 2019-05-06 MED ORDER — TORSEMIDE 20 MG PO TABS
20.0000 mg | ORAL_TABLET | Freq: Every day | ORAL | Status: DC
  Administered 2019-05-07 – 2019-05-09 (×3): 20 mg via ORAL
  Filled 2019-05-06 (×3): qty 1

## 2019-05-06 MED ORDER — LEVOCETIRIZINE DIHYDROCHLORIDE 5 MG PO TABS: 5.0000 mg | ORAL_TABLET | Freq: Every evening | ORAL | Status: DC

## 2019-05-06 MED ORDER — METOPROLOL TARTRATE 5 MG/5ML IV SOLN
5.0000 mg | Freq: Once | INTRAVENOUS | Status: AC
  Administered 2019-05-06: 5 mg via INTRAVENOUS
  Filled 2019-05-06: qty 5

## 2019-05-06 MED ORDER — DEXAMETHASONE SODIUM PHOSPHATE 10 MG/ML IJ SOLN
10.0000 mg | INTRAMUSCULAR | Status: DC
  Administered 2019-05-06 – 2019-05-07 (×2): 10 mg via INTRAVENOUS
  Filled 2019-05-06 (×2): qty 1

## 2019-05-06 MED ORDER — INSULIN GLARGINE 100 UNIT/ML ~~LOC~~ SOLN
30.0000 [IU] | Freq: Every day | SUBCUTANEOUS | Status: DC
  Administered 2019-05-07 – 2019-05-10 (×4): 30 [IU] via SUBCUTANEOUS
  Filled 2019-05-06 (×4): qty 0.3

## 2019-05-06 MED ORDER — POLYETHYLENE GLYCOL 3350 17 G PO PACK
17.0000 g | PACK | Freq: Every day | ORAL | Status: DC
  Administered 2019-05-09 – 2019-05-21 (×12): 17 g via ORAL
  Filled 2019-05-06 (×15): qty 1

## 2019-05-06 MED ORDER — FLUTICASONE PROPIONATE 50 MCG/ACT NA SUSP: 2.0000 | Freq: Every day | NASAL | Status: DC

## 2019-05-06 MED ORDER — CARVEDILOL 3.125 MG PO TABS
3.1250 mg | ORAL_TABLET | Freq: Two times a day (BID) | ORAL | Status: DC
  Administered 2019-05-06 – 2019-05-19 (×22): 3.125 mg via ORAL
  Filled 2019-05-06 (×29): qty 1

## 2019-05-06 MED ORDER — FLUTICASONE-UMECLIDIN-VILANT 100-62.5-25 MCG/INH IN AEPB: 1.0000 | INHALATION_SPRAY | Freq: Every day | RESPIRATORY_TRACT | Status: DC

## 2019-05-06 MED ORDER — ONDANSETRON HCL 4 MG/2ML IJ SOLN
4.0000 mg | Freq: Four times a day (QID) | INTRAMUSCULAR | Status: DC | PRN
  Administered 2019-05-10 – 2019-05-20 (×4): 4 mg via INTRAVENOUS
  Filled 2019-05-06 (×5): qty 2

## 2019-05-06 NOTE — ED Triage Notes (Addendum)
EMS reports pt from home.  Reports he tripped over some blankets and fell onto buttocks.  C/O pain in lower back pain.  Pt had to have family assist him to his recliner and says he hasn't tried to ambulate since then because of the pain.    Pt was ambulatory with 1-2 person assistance from EMS.  Pt on blood thinners, denies hitting head.   Bruising noted to r forearm.   Pt says pain is in lower back and denies it radiating down legs.
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
## 2019-05-06 NOTE — ED Notes (Signed)
Pts son called RN to be updated on Pt status.
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
## 2019-05-06 NOTE — Progress Notes (Signed)
Patient unable to be placed on CPAP at this time because of positive COVID test results.

## 2019-05-06 NOTE — Progress Notes (Addendum)
  NEUROSURGERY PROGRESS NOTE   Received call from Dr Roslynn Amble, EDP at [facility] regarding patient. 68 year old with history DM, OSA, HTN, CHF, COPD, Afib on Xarelto who presented to ED with  back pain after a mechanical fall. CT L spine revealed L1-2 fracture with widening of the disc space in setting of ankylosing spondylitis. Further trauma imaging still pending.  Reportedly neuro intact. This is an unstable fracture that will require surgical stabilization. Being on Xarelto complicates surgical planning. If patient is neuro intact, will not proceed with stabilization tomorrow. If he has any focal deficit, will proceed tomorrow. I have asked for patient to be admitted and transferred to [facility] for NS evaluation. - STRICT BEDREST remain flat - Hold Xarelto, ordered PT/INR.  - Rapid COVID test - Plan for NPO at midnight - Patient will need medical optimization/clearance prior to any surgical procedure.   Please call when patient arrives to [facility] for NS consultation.  Addendum Patient COVID19 positive. No change in NS plan of care. If neurologically intact, will hold on intervention until patient has been off Xarelto for 5 days, assuming medically cleared for surgery.  Ferne Reus, PA-C [facility] [employer]
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
## 2019-05-06 NOTE — ED Notes (Signed)
Pt reports has been weak and nauseated since the fall.

## 2019-05-06 NOTE — ED Notes (Signed)
Pulse fluctuating in triage.  EKG in process.

## 2019-05-06 NOTE — ED Notes (Signed)
Pt provided Urinal to use in bed. Pt required 1-person assist.

## 2019-05-07 ENCOUNTER — Encounter (HOSPITAL_COMMUNITY)
Admission: EM | Disposition: A | Discharge status: Skilled Nursing Facility | Payer: Self-pay | Source: Home / Self Care | Attending: Internal Medicine

## 2019-05-07 DIAGNOSIS — S32012A Unstable burst fracture of first lumbar vertebra, initial encounter for closed fracture: Secondary | ICD-10-CM | POA: Diagnosis present

## 2019-05-07 DIAGNOSIS — E785 Hyperlipidemia, unspecified: Secondary | ICD-10-CM

## 2019-05-07 DIAGNOSIS — I482 Chronic atrial fibrillation, unspecified: Secondary | ICD-10-CM | POA: Diagnosis not present

## 2019-05-07 DIAGNOSIS — I5022 Chronic systolic (congestive) heart failure: Secondary | ICD-10-CM | POA: Diagnosis not present

## 2019-05-07 DIAGNOSIS — G4733 Obstructive sleep apnea (adult) (pediatric): Secondary | ICD-10-CM

## 2019-05-07 DIAGNOSIS — E118 Type 2 diabetes mellitus with unspecified complications: Secondary | ICD-10-CM

## 2019-05-07 DIAGNOSIS — Z01810 Encounter for preprocedural cardiovascular examination: Secondary | ICD-10-CM

## 2019-05-07 DIAGNOSIS — U071 COVID-19: Secondary | ICD-10-CM

## 2019-05-07 DIAGNOSIS — J9611 Chronic respiratory failure with hypoxia: Secondary | ICD-10-CM | POA: Diagnosis not present

## 2019-05-07 LAB — CBG MONITORING, ED: Glucose-Capillary: 143 mg/dL — ABNORMAL HIGH (ref 70–99)

## 2019-05-07 SURGERY — POSTERIOR LUMBAR FUSION 4 LEVEL
Anesthesia: General

## 2019-05-07 MED ORDER — FLUTICASONE FUROATE-VILANTEROL 100-25 MCG/INH IN AEPB
1.0000 | INHALATION_SPRAY | Freq: Every day | RESPIRATORY_TRACT | Status: DC
  Administered 2019-05-08 – 2019-06-18 (×35): 1 via RESPIRATORY_TRACT
  Filled 2019-05-07 (×5): qty 28

## 2019-05-07 MED ORDER — GUAIFENESIN-DM 100-10 MG/5ML PO SYRP
10.0000 mL | ORAL_SOLUTION | ORAL | Status: DC | PRN
  Administered 2019-05-10 – 2019-05-24 (×5): 10 mL via ORAL
  Filled 2019-05-07 (×6): qty 10

## 2019-05-07 MED ORDER — ZINC SULFATE 220 (50 ZN) MG PO CAPS
220.0000 mg | ORAL_CAPSULE | Freq: Every day | ORAL | Status: DC
  Administered 2019-05-08 – 2019-05-20 (×12): 220 mg via ORAL
  Filled 2019-05-07 (×15): qty 1

## 2019-05-07 MED ORDER — SODIUM CHLORIDE 0.9 % IV SOLN
200.0000 mg | Freq: Once | INTRAVENOUS | Status: AC
  Administered 2019-05-07: 200 mg via INTRAVENOUS
  Filled 2019-05-07: qty 40

## 2019-05-07 MED ORDER — HEPARIN (PORCINE) 25000 UT/250ML-% IV SOLN
2350.0000 [IU]/h | INTRAVENOUS | Status: AC
  Administered 2019-05-07: 1400 [IU]/h via INTRAVENOUS
  Administered 2019-05-08: 1700 [IU]/h via INTRAVENOUS
  Administered 2019-05-09: 2100 [IU]/h via INTRAVENOUS
  Administered 2019-05-09 – 2019-05-11 (×3): 2300 [IU]/h via INTRAVENOUS
  Administered 2019-05-11: 2150 [IU]/h via INTRAVENOUS
  Administered 2019-05-12 – 2019-05-19 (×13): 2000 [IU]/h via INTRAVENOUS
  Administered 2019-05-20: 2150 [IU]/h via INTRAVENOUS
  Administered 2019-05-21 – 2019-05-24 (×9): 2350 [IU]/h via INTRAVENOUS
  Filled 2019-05-07 (×33): qty 250

## 2019-05-07 MED ORDER — FLUTICASONE PROPIONATE 50 MCG/ACT NA SUSP
2.0000 | Freq: Every day | NASAL | Status: DC
  Administered 2019-05-07 – 2019-06-18 (×39): 2 via NASAL
  Filled 2019-05-07 (×4): qty 16

## 2019-05-07 MED ORDER — HYDROCOD POLST-CPM POLST ER 10-8 MG/5ML PO SUER
5.0000 mL | Freq: Two times a day (BID) | ORAL | Status: DC | PRN
  Administered 2019-05-10 – 2019-05-17 (×3): 5 mL via ORAL
  Filled 2019-05-07 (×3): qty 5

## 2019-05-07 MED ORDER — UMECLIDINIUM BROMIDE 62.5 MCG/INH IN AEPB
1.0000 | INHALATION_SPRAY | Freq: Every day | RESPIRATORY_TRACT | Status: DC
Start: 2019-05-07 — End: 2019-06-18
  Administered 2019-05-08 – 2019-06-18 (×34): 1 via RESPIRATORY_TRACT
  Filled 2019-05-07 (×8): qty 7

## 2019-05-07 MED ORDER — VITAMIN C 500 MG PO TABS
500.0000 mg | ORAL_TABLET | Freq: Every day | ORAL | Status: DC
  Administered 2019-05-08 – 2019-05-20 (×12): 500 mg via ORAL
  Filled 2019-05-07 (×13): qty 1

## 2019-05-07 MED ORDER — INSULIN ASPART 100 UNIT/ML ~~LOC~~ SOLN
0.0000 [IU] | Freq: Three times a day (TID) | SUBCUTANEOUS | Status: DC
  Administered 2019-05-07: 1 [IU] via SUBCUTANEOUS
  Administered 2019-05-07 – 2019-05-08 (×3): 2 [IU] via SUBCUTANEOUS
  Administered 2019-05-08: 3 [IU] via SUBCUTANEOUS
  Administered 2019-05-08 – 2019-05-09 (×2): 2 [IU] via SUBCUTANEOUS
  Administered 2019-05-09 – 2019-05-10 (×3): 3 [IU] via SUBCUTANEOUS
  Administered 2019-05-10: 2 [IU] via SUBCUTANEOUS
  Administered 2019-05-10 – 2019-05-11 (×2): 3 [IU] via SUBCUTANEOUS
  Administered 2019-05-12 (×3): 5 [IU] via SUBCUTANEOUS
  Administered 2019-05-13: 2 [IU] via SUBCUTANEOUS
  Administered 2019-05-13 (×2): 7 [IU] via SUBCUTANEOUS
  Administered 2019-05-14 (×2): 3 [IU] via SUBCUTANEOUS
  Administered 2019-05-14: 7 [IU] via SUBCUTANEOUS
  Administered 2019-05-15: 3 [IU] via SUBCUTANEOUS
  Administered 2019-05-15: 2 [IU] via SUBCUTANEOUS
  Administered 2019-05-15: 7 [IU] via SUBCUTANEOUS
  Administered 2019-05-16: 2 [IU] via SUBCUTANEOUS
  Administered 2019-05-16: 5 [IU] via SUBCUTANEOUS
  Administered 2019-05-16: 3 [IU] via SUBCUTANEOUS
  Administered 2019-05-17 (×2): 2 [IU] via SUBCUTANEOUS
  Administered 2019-05-18: 3 [IU] via SUBCUTANEOUS
  Administered 2019-05-18 (×2): 2 [IU] via SUBCUTANEOUS
  Administered 2019-05-19: 3 [IU] via SUBCUTANEOUS
  Administered 2019-05-19 – 2019-05-20 (×3): 2 [IU] via SUBCUTANEOUS
  Administered 2019-05-21: 1 [IU] via SUBCUTANEOUS
  Administered 2019-05-21: 2 [IU] via SUBCUTANEOUS
  Administered 2019-05-21: 1 [IU] via SUBCUTANEOUS

## 2019-05-07 MED ORDER — SODIUM CHLORIDE 0.9 % IV SOLN
100.0000 mg | INTRAVENOUS | Status: AC
  Administered 2019-05-08 – 2019-05-11 (×4): 100 mg via INTRAVENOUS
  Filled 2019-05-07 (×5): qty 20

## 2019-05-07 MED ORDER — MORPHINE SULFATE (PF) 2 MG/ML IV SOLN
2.0000 mg | INTRAVENOUS | Status: DC | PRN
  Administered 2019-05-07 – 2019-05-13 (×10): 2 mg via INTRAVENOUS
  Filled 2019-05-07 (×11): qty 1

## 2019-05-07 MED ORDER — TORSEMIDE 20 MG PO TABS
40.0000 mg | ORAL_TABLET | Freq: Every day | ORAL | Status: DC
  Administered 2019-05-07 – 2019-05-11 (×5): 40 mg via ORAL
  Filled 2019-05-07 (×5): qty 2

## 2019-05-07 NOTE — Progress Notes (Addendum)
PROGRESS NOTE  Cody Hale AOZ:[id number] DOB: 03-14-1951 DOA: 05/06/2019 PCP: Cody Hale  HPI/Recap of past 24 hours: HPI from Cody Hale Chalmer Katrinka Hale is a 68 y.o. 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  Patient fell onto his buttock. Due to significant pain, pt was brought to the ER.  Denies any incontinence of urine or bowel. In the ED, at [facility], CT showed unstable lumbar fracture and patient was transferred to [facility].  Patient had Covid test done which was positive. Patient reports some productive cough. CXR showing vascular congestion, otherwise unremarkable.  Other labs show BNP of 326 LDH 331 CRP 14.7 WBC 11.9 hemoglobin 13.8 platelets 104 creatinine 1.3. Neurosurgery has been consulted. Patient is being admitted for further management of lumbar fracture.     Today, patient reports productive cough, with significant back pain.  Denies any chest pain, abdominal pain, nausea/vomiting, diarrhea, fever/chills.  Assessment/Plan: Active Problems:   OSA (obstructive sleep apnea)   Type II diabetes mellitus with manifestations (HCC)   Chronic systolic CHF (congestive heart failure) (HCC)   Hyperlipidemia with target LDL less than 70   Chronic atrial fibrillation   Chronic respiratory failure with hypoxia (HCC)   Unstable burst fracture of unspecified lumbar vertebra, initial encounter for closed fracture (HCC)   Closed unstable burst fracture of lumbar vertebra (HCC)   Closed unstable burst fracture of first lumbar vertebra (HCC)  Unstable L1-L2 fracture Complains of back pain Neurosurgery on board, plan for surgery early next week, but will require cardiology clearance Cardiology consulted for medical clearance Spoke to neurosurgery Cody. Conchita ParisNundkumar on  05/07/2019, who was in agreement with starting patient on heparin drip and to closely monitor for any neuro change (risk of hematoma in pt with spinal fracture) Neurochecks every 4 hours while on heparin drip Strict bed rest, remain flat Continue to hold Xarelto, pain management Further management per neurosurgery  COVID-19 viral infection Currently not hypoxic Afebrile, some leukocytosis CRP 14.7 D-dimer greater than 20 (??Hx of ankylosing spondylitis) Patient with high risk of thromboembolic events, starts [facility] Will order lower extremity Doppler to rule out DVT Chest x-ray showing vascular congestion Continue Decadron, remdesivir Trend inflammatory markers  Thrombocytopenia ?? from COVID-19 infection Daily CBC  Chronic A. Fib Rate controlled Continue Coreg Hold Xarelto  Chronic combined systolic and diastolic HF/history of bioprosthetic MVR Appears compensated BNP 326 Last EF was 40 to 45% in November 2019 Continue torsemide, Coreg  Diabetes mellitus type 2 Continue SSI, Accu-Cheks, hypoglycemic protocol  Hyperlipidemia Continue statins            Malnutrition Type:      Malnutrition Characteristics:      Nutrition Interventions:       Estimated body mass index is 39.43 kg/m as calculated from the following:   Height as of this encounter: 5\' 9"  (1.753 m).   Weight as of this encounter: 121.1 kg.     Code Status: Full  Family Communication: None at bedside  Disposition Plan: To be determined   Consultants:  Neurosurgery  Procedures:  None  Antimicrobials:  None  DVT prophylaxis: Heparin drip   Objective: Vitals:   05/07/19 1145 05/07/19 1200 05/07/19 1215 05/07/19 1230  BP:  121/60  (!) 120/57  Pulse: 73 (!) 35 91 (!) 50  Resp: 17  17 13 14   Temp:      TempSrc:      SpO2: 97% 97% 97% 93%  Weight:      Height:        Intake/Output Summary (Last 24 hours) at 05/07/2019 1310 Last data filed at  05/07/2019 0526 Gross per 24 hour  Intake 250 ml  Output 300 ml  Net -50 ml   Filed Weights   05/06/19 1457  Weight: 121.1 kg    Exam:  General: NAD   Cardiovascular: S1, S2 present  Respiratory: CTAB  Abdomen: Soft, nontender, nondistended, bowel sounds present  Musculoskeletal: No bilateral pedal edema noted  Skin: Normal  Psychiatry: Normal mood  Neurology: No obvious focal neurologic deficits noted, moving all extremities, pain on moving lower extremity   Data Reviewed: CBC: Recent Labs  Lab 05/06/19 1552  WBC 11.9*  NEUTROABS 10.7*  HGB 13.8  HCT 45.5  MCV 90.3  PLT 867*   Basic Metabolic Panel: Recent Labs  Lab 05/06/19 1552 05/06/19 2327  NA 137 137  K 3.7 3.8  CL 102 103  CO2 25 25  GLUCOSE 211* 196*  BUN 19 20  CREATININE 1.31* 1.18  CALCIUM 8.5* 8.3*   GFR: Estimated Creatinine Clearance: 77 mL/min (by C-G formula based on SCr of 1.18 mg/dL). Liver Function Tests: Recent Labs  Lab 05/06/19 2327  AST 70*  ALT 22  ALKPHOS 64  BILITOT 1.1  PROT 7.3  ALBUMIN 3.4*   No results for input(s): LIPASE, AMYLASE in the last 168 hours. No results for input(s): AMMONIA in the last 168 hours. Coagulation Profile: Recent Labs  Lab 05/06/19 1552  INR 1.2   Cardiac Enzymes: No results for input(s): CKTOTAL, CKMB, CKMBINDEX, TROPONINI in the last 168 hours. BNP (last 3 results) No results for input(s): PROBNP in the last 8760 hours. HbA1C: No results for input(s): HGBA1C in the last 72 hours. CBG: Recent Labs  Lab 05/06/19 2154  GLUCAP 181*   Lipid Profile: No results for input(s): CHOL, HDL, LDLCALC, TRIG, CHOLHDL, LDLDIRECT in the last 72 hours. Thyroid Function Tests: No results for input(s): TSH, T4TOTAL, FREET4, T3FREE, THYROIDAB in the last 72 hours. Anemia Panel: Recent Labs    05/06/19 1552  FERRITIN 118   Urine analysis:    Component Value Date/Time   COLORURINE YELLOW 07/22/2018 [place]  07/22/2018 1604   APPEARANCEUR Clear 07/23/2017   LABSPEC 1.012 07/22/2018 1604   PHURINE 7.5 07/22/2018 1604   GLUCOSEU NEGATIVE 07/22/2018 1604   GLUCOSEU 500 (A) 07/14/2017 1053   HGBUR NEGATIVE 07/22/2018 1604   BILIRUBINUR NEGATIVE 05/21/2018 2251   BILIRUBINUR Negative 07/23/2017   KETONESUR NEGATIVE 07/22/2018 1604   PROTEINUR NEGATIVE 07/22/2018 1604   UROBILINOGEN 0.2 07/14/2017 1053   NITRITE NEGATIVE 07/22/2018 1604   LEUKOCYTESUR 2+ (A) 07/22/2018 1604   Sepsis Labs: @LABRCNTIP (procalcitonin:4,lacticidven:4)  ) Recent Results (from the past 240 hour(s))  SARS Coronavirus 2 by RT PCR (hospital order, performed in [place] hospital lab) Nasopharyngeal Nasopharyngeal Swab     Status: Abnormal   Collection Time: 05/06/19  6:25 PM   Specimen: Nasopharyngeal Swab  Result Value Ref Range Status   SARS Coronavirus 2 POSITIVE (A) NEGATIVE Final    Comment: RESULT CALLED TO, READ BACK BY AND VERIFIED WITH: J APPELT,RN@2021  05/06/19 MKELLY (NOTE) If result is NEGATIVE SARS-CoV-2 target nucleic acids are NOT DETECTED. The SARS-CoV-2 RNA is generally detectable in upper and lower  respiratory specimens during the acute phase of infection.  The lowest  concentration of SARS-CoV-2 viral copies this assay can detect is 250  copies / mL. A negative result does not preclude SARS-CoV-2 infection  and should not be used as the sole basis for treatment or other  patient management decisions.  A negative result may occur with  improper specimen collection / handling, submission of specimen other  than nasopharyngeal swab, presence of viral mutation(s) within the  areas targeted by this assay, and inadequate number of viral copies  (<250 copies / mL). A negative result must be combined with clinical  observations, patient history, and epidemiological information. If result is POSITIVE SARS-CoV-2 target nucleic acids are DETECTED. The  SARS-CoV-2 RNA is generally detectable in upper  and lower  respiratory specimens during the acute phase of infection.  Positive  results are indicative of active infection with SARS-CoV-2.  Clinical  correlation with patient history and other diagnostic information is  necessary to determine patient infection status.  Positive results do  not rule out bacterial infection or co-infection with other viruses. If result is PRESUMPTIVE POSTIVE SARS-CoV-2 nucleic acids MAY BE PRESENT.   A presumptive positive result was obtained on the submitted specimen  and confirmed on repeat testing.  While 2019 novel coronavirus  (SARS-CoV-2) nucleic acids may be present in the submitted sample  additional confirmatory testing may be necessary for epidemiological  and / or clinical management purposes  to differentiate between  SARS-CoV-2 and other Sarbecovirus currently known to infect humans.  If clinically indicated additional testing with an alternate test  methodology [phone]) is ad vised. The SARS-CoV-2 RNA is generally  detectable in upper and lower respiratory specimens during the acute  phase of infection. The expected result is Negative. Fact Sheet for Patients:  BoilerBrush.com.cy Fact Sheet for Healthcare Providers: https://pope.com/ This test is not yet approved or cleared by the [place] FDA and has been authorized for detection and/or diagnosis of SARS-CoV-2 by FDA under an Emergency Use Authorization (EUA).  This EUA will remain in effect (meaning this test can be used) for the duration of the COVID-19 declaration under Section 564(b)(1) of the Act, 21 U.S.C. section 360bbb-3(b)(1), unless the authorization is terminated or revoked sooner. Performed at [facility], [address] [place], [place] [address]       Studies: Dg Pelvis 1-2 Views  Result Date: 05/06/2019 CLINICAL DATA:  Low back pain after fall. EXAM: PELVIS - 1-2 VIEW COMPARISON:  Pelvic and right hip x-rays  dated September 08, 2018. FINDINGS: There is no evidence of pelvic fracture or diastasis. Prior right total hip arthroplasty. Unchanged moderate left hip osteoarthritis. IMPRESSION: No acute osseous abnormality. Electronically Signed   By: Obie Dredge M.D.   On: 05/06/2019 19:24   Dg Forearm Right  Result Date: 05/06/2019 CLINICAL DATA:  Right forearm pain after fall. EXAM: RIGHT FOREARM - 2 VIEW COMPARISON:  Right wrist and elbow x-rays dated May 16, 2018. FINDINGS: There is no evidence of fracture or other focal bone lesions. Soft tissues are unremarkable. IMPRESSION: Negative. Electronically Signed   By: Obie Dredge M.D.   On: 05/06/2019 19:13   Ct Lumbar Spine Wo Contrast  Result Date: 05/06/2019 CLINICAL DATA:  Tender to palpation over the lower lumbar spine, fall EXAM: CT LUMBAR SPINE WITHOUT CONTRAST TECHNIQUE: Multidetector CT imaging of the lumbar spine was performed without intravenous contrast administration. Multiplanar CT image reconstructions were also generated. COMPARISON:  CT April 11, 2018 FINDINGS: Segmentation: There are 5 non-rib bearing lumbar type vertebral bodies with  the last intervertebral disc space labeled as L5-S1. Alignment: Normal Vertebrae: There is a fracture seen through the anterior bridging osteophyte of L1 with widening of the anterior inter disc space which measures 1.6 cm. The fracture extends through the right posterior bridging osteophyte and pedicle as well as through the left-sided pedicle and superior articulating facet. There is also a minimally displaced fracture seen at the right-sided inferior corner of the L1 vertebral body. The fracture also extends through the L1 spinous process. There is minimal right-sided facet joint widening seen at L1-L2. No jumped facets however are noted. A small amount of air seen within the inter disc space at L1-L2. No other definite fracture is identified. There are bridging anterior osteophytes seen through the  remainder of the lumbar spine. There inter disc calcifications. There is findings of of diffuse enthesopathy seen between the spinous processes in the lumbar spine. Paraspinal and other soft tissues: Mild prevertebral soft tissue swelling is seen at L1-L2. No large hematoma however is noted. The sacroiliac joints are intact. There is diffuse ankylosis seen around the bilateral sacroiliac joints. Scattered aortic atherosclerosis is noted. Disc levels: Degenerative changes are seen throughout the lumbar spine with disc height loss and facet arthropathy which is most notable at L5-S1 with severe bilateral neural foraminal narrowing. IMPRESSION: 1. Fracture through the anterior bridging osteophyte of L1-2 with widening of the inter disc space . The fracture extends through the posterior columns at L1, as described above through the pedicles, facet, and spinous process. 2. Mild prevertebral soft tissue swelling is seen at this level. 3. Findings suggestive of ankylosing spondylitis. 4.  Aortic Atherosclerosis (ICD10-I70.0). These results were called by telephone at the time of interpretation on 05/06/2019 at 4:56 pm to provider [facility] , who verbally acknowledged these results. Electronically Signed   By: Jonna Clark M.D.   On: 05/06/2019 17:02   Dg Chest Portable 1 View  Result Date: 05/06/2019 CLINICAL DATA:  Fall. EXAM: PORTABLE CHEST 1 VIEW COMPARISON:  Chest x-ray dated January 13, 2019. FINDINGS: Stable cardiomegaly. Pulmonary vascular congestion. No focal consolidation, pleural effusion, or pneumothorax. No acute osseous abnormality. Old right-sided rib fractures again noted. IMPRESSION: Cardiomegaly with pulmonary vascular congestion. Electronically Signed   By: Obie Dredge M.D.   On: 05/06/2019 19:11    Scheduled Meds: . carvedilol  3.125 mg Oral BID  . dexamethasone (DECADRON) injection  10 mg Intravenous Q24H  . fluticasone  2 spray Each Nare Daily  . Fluticasone-Umeclidin-Vilant  1 puff  Inhalation Daily  . insulin aspart  0-9 Units Subcutaneous TID WC  . insulin glargine  30 Units Subcutaneous Daily  . loratadine  10 mg Oral QPM  . losartan  12.5 mg Oral Daily  . pantoprazole  40 mg Oral Daily  . polyethylene glycol  17 g Oral Daily  . potassium chloride SA  20 mEq Oral BID  . senna-docusate  2 tablet Oral BID  . torsemide  20 mg Oral q1800  . torsemide  40 mg Oral Daily    Continuous Infusions: . [START ON 05/08/2019] remdesivir 100 mg in NS 250 mL       LOS: 1 day     Briant Cedar, [facility]  If 7PM-7AM, please contact night-coverage [email or web address] 05/07/2019, 1:10 PM

## 2019-05-07 NOTE — Progress Notes (Signed)
ANTICOAGULATION CONSULT NOTE - Initial Consult  Pharmacy Consult:  Heparin Indication: atrial fibrillation  Allergies  Allergen Reactions  . Metformin And Related Diarrhea  . Daptomycin Rash  . Lisinopril Cough  . Tape Rash and Other (See Comments)    Adhesive Tape-Burn skin.    Patient Measurements: Height: 5\' 9"  (175.3 cm) Weight: 267 lb (121.1 kg) IBW/kg (Calculated) : 70.7 Heparin Dosing Weight: 98 kg  Vital Signs: BP: 108/62 (10/30 1600) Pulse Rate: 66 (10/30 1600)  Labs: Recent Labs    05/06/19 1552 05/06/19 2327  HGB 13.8  --   HCT 45.5  --   PLT 104*  --   APTT 36  --   LABPROT 15.0  --   INR 1.2  --   CREATININE 1.31* 1.18    Estimated Creatinine Clearance: 77 mL/min (by C-G formula based on SCr of 1.18 mg/dL).   Medical History: Past Medical History:  Diagnosis Date  . A-fib ([place])   . Anxiety   . Atrial fibrillation ([place])   . Cellulitis   . CHF (congestive heart failure) ([place])   . Chronic venous insufficiency   . COPD (chronic obstructive pulmonary disease) ([place])   . Coronary atherosclerosis of native coronary artery    Mild nonobstructive 08/2012  . Degenerative joint disease   . Diabetes mellitus, type II ([place])    Gastroparesis; [facility] care at [facility]  . 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 s/p zyvox rx.  (Cultures never positive); left bundle branch block; H/o SVT; 09/2012: bioprosthetic MVR at [facility]; a. Severe dental caries and cavities s/p multiple extractions.  . Essential hypertension, benign   . Gastroparesis   . 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 Left bundle branch block   . Left leg cellulitis   . Major depressive disorder, recurrent severe without psychotic features ([place])    [place] admission 12/2012  . Morbid obesity ([place]) 06/27/2012  . Nephrolithiasis   . PSVT (paroxysmal supraventricular tachycardia) (HCC)    [facility]-op at [facility]  . Sleep apnea    a. uses CPAP nightly  . Urinary tract infection  08/30/2012   Proteus mirabilis    Assessment: 76 YOM presented s/p fall with lumbar fracture.  Found to be positive for Covid-19 with d-dimer > 20.  Patient was on Xarelto PTA for history of Afib, which to remain on hold for possible surgery (patient last took Xarelto on 05/05/19 AM).  Pharmacy has been consulted to dose IV heparin without boluses.  Noted low platelet count; no bleeding documented.  Goal of Therapy:  Heparin level 0.3-0.7 units/ml aPTT 66 - 102 seconds Monitor platelets by anticoagulation protocol: Yes   Plan:  Heparin gtt at 1400 units/hr, no bolus per MD Check 6 hr heparin level and aPTT Daily heparin level, aPTT and CBC  Tildon Silveria D. Mina Marble, PharmD, BCPS, [place] 05/07/2019, 4:34 PM

## 2019-05-07 NOTE — Progress Notes (Signed)
MEDICATION RELATED CONSULT NOTE - INITIAL   Pharmacy Consult for remdesivir Indication: COVID-19  Allergies  Allergen Reactions  . Metformin And Related Diarrhea  . Daptomycin Rash  . Lisinopril Cough  . Tape Rash and Other (See Comments)    Adhesive Tape-Burn skin.    Patient Measurements: Height: 5\' 9"  (175.3 cm) Weight: 267 lb (121.1 kg) IBW/kg (Calculated) : 70.7   Vital Signs: Temp: 99 F (37.2 C) (10/29 1457) Temp Source: Oral (10/29 1457) BP: 145/67 (10/30 0000) Pulse Rate: 98 (10/30 0000) Intake/Output from previous day: No intake/output data recorded. Intake/Output from this shift: No intake/output data recorded.  Labs: Recent Labs    05/06/19 1552 05/06/19 2327  WBC 11.9*  --   HGB 13.8  --   HCT 45.5  --   PLT 104*  --   APTT 36  --   CREATININE 1.31* 1.18  ALBUMIN  --  3.4*  PROT  --  7.3  AST  --  70*  ALT  --  22  ALKPHOS  --  64  BILITOT  --  1.1   Estimated Creatinine Clearance: 77 mL/min (by C-G formula based on SCr of 1.18 mg/dL).   Microbiology: Recent Results (from the past 720 hour(s))  SARS Coronavirus 2 by RT PCR (hospital order, performed in [facility] hospital lab) Nasopharyngeal Nasopharyngeal Swab     Status: Abnormal   Collection Time: 05/06/19  6:25 PM   Specimen: Nasopharyngeal Swab  Result Value Ref Range Status   SARS Coronavirus 2 POSITIVE (A) NEGATIVE Final    Comment: RESULT CALLED TO, READ BACK BY AND VERIFIED WITH: J APPELT,RN@2021  05/06/19 MKELLY (NOTE) If result is NEGATIVE SARS-CoV-2 target nucleic acids are NOT DETECTED. The SARS-CoV-2 RNA is generally detectable in upper and lower  respiratory specimens during the acute phase of infection. The lowest  concentration of SARS-CoV-2 viral copies this assay can detect is 250  copies / mL. A negative result does not preclude SARS-CoV-2 infection  and should not be used as the sole basis for treatment or other  patient management decisions.  A negative result may  occur with  improper specimen collection / handling, submission of specimen other  than nasopharyngeal swab, presence of viral mutation(s) within the  areas targeted by this assay, and inadequate number of viral copies  (<250 copies / mL). A negative result must be combined with clinical  observations, patient history, and epidemiological information. If result is POSITIVE SARS-CoV-2 target nucleic acids are DETECTED. The  SARS-CoV-2 RNA is generally detectable in upper and lower  respiratory specimens during the acute phase of infection.  Positive  results are indicative of active infection with SARS-CoV-2.  Clinical  correlation with patient history and other diagnostic information is  necessary to determine patient infection status.  Positive results do  not rule out bacterial infection or co-infection with other viruses. If result is PRESUMPTIVE POSTIVE SARS-CoV-2 nucleic acids MAY BE PRESENT.   A presumptive positive result was obtained on the submitted specimen  and confirmed on repeat testing.  While 2019 novel coronavirus  (SARS-CoV-2) nucleic acids may be present in the submitted sample  additional confirmatory testing may be necessary for epidemiological  and / or clinical management purposes  to differentiate between  SARS-CoV-2 and other Sarbecovirus currently known to infect humans.  If clinically indicated additional testing with an alternate test  methodology [phone] is ad vised. The SARS-CoV-2 RNA is generally  detectable in upper and lower respiratory specimens during the acute  phase of infection. The expected result is Negative. Fact Sheet for Patients:  StrictlyIdeas.no Fact Sheet for Healthcare Providers: BankingDealers.co.za This test is not yet approved or cleared by the [place] FDA and has been authorized for detection and/or diagnosis of SARS-CoV-2 by FDA under an Emergency Use Authorization (EUA).  This  EUA will remain in effect (meaning this test can be used) for the duration of the COVID-19 declaration under Section 564(b)(1) of the Act, 21 U.S.C. section 360bbb-3(b)(1), unless the authorization is terminated or revoked sooner. Performed at [facility], [address], [place], [place] [address]     Medical History: Past Medical History:  Diagnosis Date  . A-fib ([place])   . Anxiety   . Atrial fibrillation ([place])   . Cellulitis   . CHF (congestive heart failure) ([place])   . Chronic venous insufficiency   . COPD (chronic obstructive pulmonary disease) ([place])   . Coronary atherosclerosis of native coronary artery    Mild nonobstructive 08/2012  . Degenerative joint disease   . Diabetes mellitus, type II ([place])    Gastroparesis; GI care at [facility]  . 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 s/p zyvox rx.  (Cultures never positive); left bundle branch block; H/o SVT; 09/2012: bioprosthetic MVR at [facility]; a. Severe dental caries and cavities s/p multiple extractions.  . Essential hypertension, benign   . Gastroparesis   . 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 Left bundle branch block   . Left leg cellulitis   . Major depressive disorder, recurrent severe without psychotic features ([place])    [place] admission 12/2012  . Morbid obesity ([place]) 06/27/2012  . Nephrolithiasis   . PSVT (paroxysmal supraventricular tachycardia) (HCC)    Post-op at [facility]  . Sleep apnea    a. uses CPAP nightly  . Urinary tract infection 08/30/2012   Proteus mirabilis    Medications:  See med history  Assessment: 68 yo man to start remdesivir for COVID 19.  Plan:  Remdesivir 200 mg IV x 1 then 100 mg IV q24 hours x 4 doses  Freja Faro [place]et 05/07/2019,1:00 AM

## 2019-05-07 NOTE — Progress Notes (Signed)
Patient not placed on CPAP at this time due to testing positive for COVID.

## 2019-05-07 NOTE — Progress Notes (Signed)
  NEUROSURGERY PROGRESS NOTE   Updated son, [name], regarding NS plan of care going forward. Discussed tentative plan for surgery early next week assuming medically stable. We discussed surgery including risks, benefits, alternatives etc. He agrees with proceeding forward.   He does have some concerns regarding post op plan for rehab. Patient was living in assisted facility until July when [name] took him out due to "poor care". Both [name] and his wife work  3rd shift, so he is obviously fearful about patient returning home immediately after surgery. We discussed likely need for SNF placement, but ultimately will follow PT/OT recs and family wishes. [name] is going to talk with his wife about plan.   He also informed me about outbreak of COVID at [employer]. He was self quarantining for the past several days, but no symptoms.  Total spine spent: 15 minutes

## 2019-05-07 NOTE — ED Notes (Signed)
Patient son calling asking for an update on him  Please call Emmanuelle Coxe [phone] and or cell [phone]

## 2019-05-07 NOTE — ED Provider Notes (Signed)
Patient transferred from [facility].  Patient had a mechanical fall and sustained L1-L2 fracture.  History of ankylosing spondylitis.  Transferred here for neurosurgery evaluation and admission.  Patient is Covid positive.  Neurosurgery at bedside and will admit.   Fatima Blank, MD 05/07/19 [phone]
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
## 2019-05-07 NOTE — Consult Note (Signed)
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 will discontinue . Plan for surgery early next week after he has been off Xarelto for a couple days. Until surgery, patient is to remain on strict bedrest and remain flat.  He is also COVID positive. Given multiple medical comorbidities, he will also need medical clearance for surgery.  Please call for any concerns.  Ferne Reus, PA-C [facility] [employer]

## 2019-05-07 NOTE — ED Notes (Signed)
Pt arrives as a transfer from [facility] via [facility]. Pt arrives alert oriented x4, PIV, foley intact, in NAD. Surgeon at bedside discussing plan of care.
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
## 2019-05-08 ENCOUNTER — Inpatient Hospital Stay (HOSPITAL_COMMUNITY): Payer: Medicare Other

## 2019-05-08 DIAGNOSIS — J9611 Chronic respiratory failure with hypoxia: Secondary | ICD-10-CM

## 2019-05-08 DIAGNOSIS — S32012A Unstable burst fracture of first lumbar vertebra, initial encounter for closed fracture: Secondary | ICD-10-CM | POA: Diagnosis not present

## 2019-05-08 DIAGNOSIS — U071 COVID-19: Secondary | ICD-10-CM | POA: Diagnosis not present

## 2019-05-08 DIAGNOSIS — R7989 Other specified abnormal findings of blood chemistry: Secondary | ICD-10-CM | POA: Diagnosis not present

## 2019-05-08 DIAGNOSIS — I482 Chronic atrial fibrillation, unspecified: Secondary | ICD-10-CM | POA: Diagnosis not present

## 2019-05-08 DIAGNOSIS — I5022 Chronic systolic (congestive) heart failure: Secondary | ICD-10-CM | POA: Diagnosis not present

## 2019-05-08 LAB — CBC WITH DIFFERENTIAL/PLATELET
Abs Immature Granulocytes: 0.05 10*3/uL (ref 0.00–0.07)
Basophils Absolute: 0 10*3/uL (ref 0.0–0.1)
Basophils Relative: 0 %
Eosinophils Absolute: 0 10*3/uL (ref 0.0–0.5)
Eosinophils Relative: 0 %
HCT: 40.2 % (ref 39.0–52.0)
Hemoglobin: 12.4 g/dL — ABNORMAL LOW (ref 13.0–17.0)
Immature Granulocytes: 0 %
Lymphocytes Relative: 2 %
Lymphs Abs: 0.2 10*3/uL — ABNORMAL LOW (ref 0.7–4.0)
MCH: 27.8 pg (ref 26.0–34.0)
MCHC: 30.8 g/dL (ref 30.0–36.0)
MCV: 90.1 fL (ref 80.0–100.0)
Monocytes Absolute: 0.8 10*3/uL (ref 0.1–1.0)
Monocytes Relative: 7 %
Neutro Abs: 10.3 10*3/uL — ABNORMAL HIGH (ref 1.7–7.7)
Neutrophils Relative %: 91 %
Platelets: 108 10*3/uL — ABNORMAL LOW (ref 150–400)
RBC: 4.46 MIL/uL (ref 4.22–5.81)
RDW: 15.6 % — ABNORMAL HIGH (ref 11.5–15.5)
WBC: 11.3 10*3/uL — ABNORMAL HIGH (ref 4.0–10.5)
nRBC: 0 % (ref 0.0–0.2)

## 2019-05-08 LAB — APTT
aPTT: 39 seconds — ABNORMAL HIGH (ref 24–36)
aPTT: 38 seconds — ABNORMAL HIGH (ref 24–36)
aPTT: 54 seconds — ABNORMAL HIGH (ref 24–36)
aPTT: 34 seconds (ref 24–36)

## 2019-05-08 LAB — HEPARIN LEVEL (UNFRACTIONATED)
Heparin Unfractionated: 0.55 IU/mL (ref 0.30–0.70)
Heparin Unfractionated: 0.76 IU/mL — ABNORMAL HIGH (ref 0.30–0.70)

## 2019-05-08 LAB — CBG MONITORING, ED
Glucose-Capillary: 231 mg/dL — ABNORMAL HIGH (ref 70–99)
Glucose-Capillary: 197 mg/dL — ABNORMAL HIGH (ref 70–99)
Glucose-Capillary: 191 mg/dL — ABNORMAL HIGH (ref 70–99)

## 2019-05-08 LAB — COMPREHENSIVE METABOLIC PANEL
ALT: 20 U/L (ref 0–44)
AST: 43 U/L — ABNORMAL HIGH (ref 15–41)
Albumin: 2.8 g/dL — ABNORMAL LOW (ref 3.5–5.0)
Alkaline Phosphatase: 60 U/L (ref 38–126)
Anion gap: 11 (ref 5–15)
BUN: 36 mg/dL — ABNORMAL HIGH (ref 8–23)
CO2: 25 mmol/L (ref 22–32)
Calcium: 8.7 mg/dL — ABNORMAL LOW (ref 8.9–10.3)
Chloride: 101 mmol/L (ref 98–111)
Creatinine, Ser: 1.33 mg/dL — ABNORMAL HIGH (ref 0.61–1.24)
GFR calc Af Amer: >60 mL/min (ref >60)
GFR calc non Af Amer: 55 mL/min — ABNORMAL LOW (ref >60)
Glucose, Bld: 221 mg/dL — ABNORMAL HIGH (ref 70–99)
Potassium: 4.8 mmol/L (ref 3.5–5.1)
Sodium: 137 mmol/L (ref 135–145)
Total Bilirubin: 0.7 mg/dL (ref 0.3–1.2)
Total Protein: 6 g/dL — ABNORMAL LOW (ref 6.5–8.1)

## 2019-05-08 LAB — C-REACTIVE PROTEIN: CRP: 19.9 mg/dL — ABNORMAL HIGH (ref <1.0)

## 2019-05-08 LAB — HIV ANTIBODY (ROUTINE TESTING W REFLEX): HIV Screen 4th Generation wRfx: NONREACTIVE

## 2019-05-08 LAB — FERRITIN: Ferritin: 212 ng/mL (ref 24–336)

## 2019-05-08 LAB — D-DIMER, QUANTITATIVE: D-Dimer, Quant: 3.41 ug/mL-FEU — ABNORMAL HIGH (ref 0.00–0.50)

## 2019-05-08 MED ORDER — DEXAMETHASONE SODIUM PHOSPHATE 10 MG/ML IJ SOLN
6.0000 mg | INTRAMUSCULAR | Status: DC
  Administered 2019-05-08 – 2019-05-15 (×8): 6 mg via INTRAVENOUS
  Filled 2019-05-08 (×8): qty 1

## 2019-05-08 MED ORDER — CHLORHEXIDINE GLUCONATE CLOTH 2 % EX PADS
6.0000 | MEDICATED_PAD | Freq: Every day | CUTANEOUS | Status: DC
  Administered 2019-05-09 – 2019-06-16 (×37): 6 via TOPICAL

## 2019-05-08 NOTE — Progress Notes (Signed)
ANTICOAGULATION CONSULT NOTE - Follow Up Consult  Pharmacy Consult for heparin Indication: atrial fibrillation  Labs: Recent Labs    05/06/19 1552 05/06/19 2327 05/08/19 0224  HGB 13.8  --   --   HCT 45.5  --   --   PLT 104*  --   --   APTT 36  --  39*  LABPROT 15.0  --   --   INR 1.2  --   --   HEPARINUNFRC  --   --  0.55  CREATININE 1.31* 1.18  --     Assessment: 68yo male subtherapeutic on heparin with initial dosing while Eliquis on hold; no gtt issues or signs of bleeding per RN.  Goal of Therapy:  aPTT 66-102 seconds   Plan:  Will increase heparin gtt by 3 units/kgABW/hr to 1700 units/hr and check PTT in 6 hours.    Wynona Neat, PharmD, BCPS  05/08/2019,3:38 AM

## 2019-05-08 NOTE — ED Notes (Signed)
Pt had episode of desaturation to the 50/6o's that came back up to 100% upon being awoken. Breifly confused but came back to baseline mentation. Also reported some sensory loss in R foot. Attempting to communicate update to admitting via pager and message, no new orders at this time

## 2019-05-08 NOTE — Progress Notes (Signed)
PROGRESS NOTE  Cody Hale TDV:[id number] DOB: 04/02/1951 DOA: 05/06/2019 PCP: [name], MD  HPI/Recap of past 24 hours: HPI from Dr Stephens November Cody Hale is a 68 y.o. 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  Patient fell onto his buttock. Due to significant pain, pt was brought to the ER.  Denies any incontinence of urine or bowel. In the ED, at [facility], CT showed unstable lumbar fracture and patient was transferred to [facility].  Patient had Covid test done which was positive. Patient reports some productive cough. CXR showing vascular congestion, otherwise unremarkable.  Other labs show BNP of 326 LDH 331 CRP 14.7 WBC 11.9 hemoglobin 13.8 platelets 104 creatinine 1.3. Neurosurgery has been consulted. Patient is being admitted for further management of lumbar fracture.     Today, patient noted to be desaturating while sleeping, does have a history of OSA but CPAP cannot be used, recommended placing patient on oxygen.  Patient denies worsening shortness of breath, denies any worsening cough, fever/chills, chest pain, abdominal pain, nausea/vomiting.  Assessment/Plan: Active Problems:   OSA (obstructive sleep apnea)   Type II diabetes mellitus with manifestations (HCC)   Chronic systolic CHF (congestive heart failure) (HCC)   Hyperlipidemia with target LDL less than 70   Chronic atrial fibrillation   Chronic respiratory failure with hypoxia (HCC)   Unstable burst fracture of unspecified lumbar vertebra, initial encounter for closed fracture (HCC)   Closed unstable burst fracture of lumbar vertebra (HCC)   Closed unstable burst fracture of first lumbar vertebra ([place])  Unstable L1-L2 fracture Complains of back pain Neurosurgery on board, plan for surgery early next  week, but will require cardiology clearance Cardiology consulted for medical clearance, cleared for surgery Spoke to neurosurgery Dr. Kathyrn Sheriff on 05/07/2019, who was in agreement with starting patient on heparin drip and to closely monitor for any neuro change (risk of hematoma in pt with spinal fracture) Neurochecks every 4 hours while on heparin drip Strict bed rest, remain flat Continue to hold Xarelto, pain management Further management per neurosurgery  COVID-19 viral infection Currently desaturating while sleeping, has a history of OSA, cannot use CPAP due to Covid infection Afebrile, some leukocytosis CRP 14.7-->19.9 D-dimer greater than 20 (??Hx of ankylosing spondylitis) Patient with high risk of thromboembolic events, starts heparin drip for [facility] Will order lower extremity Doppler to rule out DVT Chest x-ray showing vascular congestion Continue Decadron, remdesivir Trend inflammatory markers  Thrombocytopenia ?? from COVID-19 infection Daily CBC  Chronic A. Fib Rate controlled Continue Coreg Hold Xarelto Continue heparin drip  Chronic combined systolic and diastolic HF/history of bioprosthetic MVR Appears compensated BNP 326 Last EF was 40 to 45% in November 2019 Continue torsemide, Coreg  Diabetes mellitus type 2 Continue SSI, Accu-Cheks, hypoglycemic protocol  Hyperlipidemia Continue statins            Malnutrition Type:      Malnutrition Characteristics:      Nutrition Interventions:       Estimated body mass index is 39.43 kg/m as calculated from the following:   Height as of this encounter: 5\' 9"  (1.753 m).   Weight as of this encounter: 121.1 kg.     Code Status: Full  Family Communication: None at bedside  Disposition Plan: To be determined   Consultants:  Neurosurgery  Procedures:  None  Antimicrobials:  None  DVT prophylaxis: Heparin drip   Objective: Vitals:   05/08/19 1500 05/08/19 1515 05/08/19 1530  05/08/19 1545  BP: 125/74 117/74 115/77 132/76  Pulse: (!) 56 71 88 81  Resp: 15 16 14 12   Temp:      TempSrc:      SpO2: 99% 96% 100% 100%  Weight:      Height:        Intake/Output Summary (Last 24 hours) at 05/08/2019 1623 Last data filed at 05/08/2019 0441 Gross per 24 hour  Intake 404.34 ml  Output -  Net 404.34 ml   Filed Weights   05/06/19 1457  Weight: 121.1 kg    Exam:  General: NAD   Cardiovascular: S1, S2 present  Respiratory:  Bibasilar crackles noted  Abdomen: Soft, nontender, nondistended, bowel sounds present  Musculoskeletal: No bilateral pedal edema noted  Skin: Normal  Psychiatry: Normal mood  Neurology: No obvious focal neurologic deficits noted, moving all extremities, limited range of motion due to pain   Data Reviewed: CBC: Recent Labs  Lab 05/06/19 1552 05/08/19 0500  WBC 11.9* 11.3*  NEUTROABS 10.7* 10.3*  HGB 13.8 12.4*  HCT 45.5 40.2  MCV 90.3 90.1  PLT 104* 108*   Basic Metabolic Panel: Recent Labs  Lab 05/06/19 1552 05/06/19 2327 05/08/19 0500  NA 137 137 137  K 3.7 3.8 4.8  CL 102 103 101  CO2 25 25 25   GLUCOSE 211* 196* 221*  BUN 19 20 36*  CREATININE 1.31* 1.18 1.33*  CALCIUM 8.5* 8.3* 8.7*   GFR: Estimated Creatinine Clearance: 68.3 mL/min (A) (by C-G formula based on SCr of 1.33 mg/dL (H)). Liver Function Tests: Recent Labs  Lab 05/06/19 2327 05/08/19 0500  AST 70* 43*  ALT 22 20  ALKPHOS 64 60  BILITOT 1.1 0.7  PROT 7.3 6.0*  ALBUMIN 3.4* 2.8*   No results for input(s): LIPASE, AMYLASE in the last 168 hours. No results for input(s): AMMONIA in the last 168 hours. Coagulation Profile: Recent Labs  Lab 05/06/19 1552  INR 1.2   Cardiac Enzymes: No results for input(s): CKTOTAL, CKMB, CKMBINDEX, TROPONINI in the last 168 hours. BNP (last 3 results) No results for input(s): PROBNP in the last 8760 hours. HbA1C: No results for input(s): HGBA1C in the last 72 hours. CBG: Recent Labs  Lab  05/06/19 2154 05/07/19 1803 05/08/19 0802 05/08/19 1227  GLUCAP 181* 143* 231* 197*   Lipid Profile: No results for input(s): CHOL, HDL, LDLCALC, TRIG, CHOLHDL, LDLDIRECT in the last 72 hours. Thyroid Function Tests: No results for input(s): TSH, T4TOTAL, FREET4, T3FREE, THYROIDAB in the last 72 hours. Anemia Panel: Recent Labs    05/06/19 1552 05/08/19 0500  FERRITIN 118 212   Urine analysis:    Component Value Date/Time   COLORURINE YELLOW 07/22/2018 1604   APPEARANCEUR CLEAR 07/22/2018 1604   APPEARANCEUR Clear 07/23/2017   LABSPEC 1.012 07/22/2018 1604   PHURINE 7.5 07/22/2018 1604   GLUCOSEU NEGATIVE 07/22/2018 1604   GLUCOSEU 500 (A) 07/14/2017 1053   HGBUR NEGATIVE 07/22/2018 1604   BILIRUBINUR NEGATIVE 05/21/2018 2251   BILIRUBINUR Negative 07/23/2017   KETONESUR NEGATIVE 07/22/2018 1604   PROTEINUR NEGATIVE 07/22/2018 1604   UROBILINOGEN 0.2 07/14/2017 1053   NITRITE NEGATIVE 07/22/2018 1604   LEUKOCYTESUR 2+ (A) 07/22/2018 1604   Sepsis Labs: @LABRCNTIP (procalcitonin:4,lacticidven:4)  ) Recent Results (from the past 240 hour(s))  SARS Coronavirus 2 by RT PCR (hospital order, performed in [facility] hospital lab) Nasopharyngeal Nasopharyngeal Swab  Status: Abnormal   Collection Time: 05/06/19  6:25 PM   Specimen: Nasopharyngeal Swab  Result Value Ref Range Status   SARS Coronavirus 2 POSITIVE (A) NEGATIVE Final    Comment: RESULT CALLED TO, READ BACK BY AND VERIFIED WITH: J APPELT,RN@2021  05/06/19 MKELLY (NOTE) If result is NEGATIVE SARS-CoV-2 target nucleic acids are NOT DETECTED. The SARS-CoV-2 RNA is generally detectable in upper and lower  respiratory specimens during the acute phase of infection. The lowest  concentration of SARS-CoV-2 viral copies this assay can detect is 250  copies / mL. A negative result does not preclude SARS-CoV-2 infection  and should not be used as the sole basis for treatment or other  patient management decisions.   A negative result may occur with  improper specimen collection / handling, submission of specimen other  than nasopharyngeal swab, presence of viral mutation(s) within the  areas targeted by this assay, and inadequate number of viral copies  (<250 copies / mL). A negative result must be combined with clinical  observations, patient history, and epidemiological information. If result is POSITIVE SARS-CoV-2 target nucleic acids are DETECTED. The  SARS-CoV-2 RNA is generally detectable in upper and lower  respiratory specimens during the acute phase of infection.  Positive  results are indicative of active infection with SARS-CoV-2.  Clinical  correlation with patient history and other diagnostic information is  necessary to determine patient infection status.  Positive results do  not rule out bacterial infection or co-infection with other viruses. If result is PRESUMPTIVE POSTIVE SARS-CoV-2 nucleic acids MAY BE PRESENT.   A presumptive positive result was obtained on the submitted specimen  and confirmed on repeat testing.  While 2019 novel coronavirus  (SARS-CoV-2) nucleic acids may be present in the submitted sample  additional confirmatory testing may be necessary for epidemiological  and / or clinical management purposes  to differentiate between  SARS-CoV-2 and other Sarbecovirus currently known to infect humans.  If clinically indicated additional testing with an alternate test  methodology [phone] is ad vised. The SARS-CoV-2 RNA is generally  detectable in upper and lower respiratory specimens during the acute  phase of infection. The expected result is Negative. Fact Sheet for Patients:  BoilerBrush.com.cyhttps://www.fda.gov/media/136312/download Fact Sheet for Healthcare Providers: https://pope.com/https://www.fda.gov/media/136313/download This test is not yet approved or cleared by the [place] FDA and has been authorized for detection and/or diagnosis of SARS-CoV-2 by FDA under an Emergency Use  Authorization (EUA).  This EUA will remain in effect (meaning this test can be used) for the duration of the COVID-19 declaration under Section 564(b)(1) of the Act, 21 U.S.C. section 360bbb-3(b)(1), unless the authorization is terminated or revoked sooner. Performed at [facility], [address] [place], [place] [address]       Studies: No results found.  Scheduled Meds: . carvedilol  3.125 mg Oral BID  . dexamethasone (DECADRON) injection  6 mg Intravenous Q24H  . fluticasone  2 spray Each Nare Daily  . fluticasone furoate-vilanterol  1 puff Inhalation Daily  . insulin aspart  0-9 Units Subcutaneous TID WC  . insulin glargine  30 Units Subcutaneous Daily  . loratadine  10 mg Oral QPM  . pantoprazole  40 mg Oral Daily  . polyethylene glycol  17 g Oral Daily  . potassium chloride SA  20 mEq Oral BID  . senna-docusate  2 tablet Oral BID  . torsemide  20 mg Oral q1800  . torsemide  40 mg Oral Daily  . umeclidinium bromide  1 puff Inhalation  Daily  . vitamin C  500 mg Oral Daily  . zinc sulfate  220 mg Oral Daily    Continuous Infusions: . heparin 1,900 Units/hr (05/08/19 1423)  . remdesivir 100 mg in NS 250 mL Stopped (05/08/19 0259)     LOS: 2 days     Briant Cedar, MD [facility]  If 7PM-7AM, please contact night-coverage [email or web address] 05/08/2019, 4:23 PM

## 2019-05-08 NOTE — Progress Notes (Signed)
VASCULAR LAB PRELIMINARY  PRELIMINARY  PRELIMINARY  PRELIMINARY  Bilateral lower extremity venous duplex completed.    Preliminary report:  See CV proc for preliminary results.   Normajean Nash, RVT 05/08/2019, 4:32 PM

## 2019-05-08 NOTE — Progress Notes (Signed)
CPAP ordered but per policy PT can not go on CPAP with + covid.

## 2019-05-08 NOTE — ED Notes (Signed)
Dinner Tray Ordered @ 1723. 

## 2019-05-08 NOTE — ED Notes (Signed)
Discussed meds and insulin with patient, he would rather have daily meds and both insulins with lunch

## 2019-05-08 NOTE — ED Notes (Signed)
Lunch Tray Ordered @ 1043. 

## 2019-05-08 NOTE — Progress Notes (Signed)
ANTICOAGULATION CONSULT NOTE - Follow Up Consult  Pharmacy Consult for heparin Indication: atrial fibrillation  Labs: Recent Labs    05/06/19 1552 05/06/19 2327 05/08/19 0224 05/08/19 0500 05/08/19 1240 05/08/19 2000  HGB 13.8  --   --  12.4*  --   --   HCT 45.5  --   --  40.2  --   --   PLT 104*  --   --  108*  --   --   APTT 36  --  39* 38* 54* 34  LABPROT 15.0  --   --   --   --   --   INR 1.2  --   --   --   --   --   HEPARINUNFRC  --   --  0.55  --  0.76*  --   CREATININE 1.31* 1.18  --  1.33*  --   --     Assessment: 16 YOM presented s/p fall with lumbar fracture.  Found to be positive for Covid-19 with d-dimer > 20.  Patient was on Xarelto PTA for history of Afib, which to remain on hold for possible surgery (patient last took Xarelto on 05/05/19 AM).    APTT came back subtherapeutic again at 34, on 1900 units/hr. Hgb 12.4, plt 108. No s/sx of bleeding or infusion issues.   Continue to correlate aPTT and heparin levels until correlate.   Goal of Therapy:  Heparin level 0.3-0.7 units/ml aPTT 66 - 102 seconds Monitor platelets by anticoagulation protocol: Yes   Plan:  Increase heparin infusion to 2100 units/hr  Order aPTT in 6 hours Monitor daily HL, aPTT, CBC, and for s/sx of bleeding.  Antonietta Jewel, PharmD, BCCCP Clinical Pharmacist  Phone: [phone]  Please check AMION for all [place] phone numbers After 10:00 PM, call [place] [phone] 05/08/2019,8:52 PM

## 2019-05-08 NOTE — Progress Notes (Signed)
ANTICOAGULATION CONSULT NOTE - Follow Up Consult  Pharmacy Consult for heparin Indication: atrial fibrillation  Labs: Recent Labs    05/06/19 1552 05/06/19 2327 05/08/19 0224 05/08/19 0500  HGB 13.8  --   --  12.4*  HCT 45.5  --   --  40.2  PLT 104*  --   --  108*  APTT 36  --  39* 38*  LABPROT 15.0  --   --   --   INR 1.2  --   --   --   HEPARINUNFRC  --   --  0.55  --   CREATININE 1.31* 1.18  --  1.33*    Assessment: 15 YOM presented s/p fall with lumbar fracture.  Found to be positive for Covid-19 with d-dimer > 20.  Patient was on Xarelto PTA for history of Afib, which to remain on hold for possible surgery (patient last took Xarelto on 05/05/19 AM).    APTT came back subtherapeutic again at 38, on 1700 units/hr - however, level drawn at time of rate increase, will disregard result. APTT drawn 6 hours after rate increase came back subtherapeutic at 54 (heparin level supratherapeutic still likely second to recent Xarelto dose). Hgb 12.4, plt 108. No s/sx of bleeding.   Continue to correlate aPTT and heparin levels until correlate.   Goal of Therapy:  Heparin level 0.3-0.7 units/ml aPTT 66 - 102 seconds Monitor platelets by anticoagulation protocol: Yes   Plan:  Increase heparin infusion to 1900 units/hr  Order aPTT in 6 hours Monitor daily HL, aPTT, CBC, and for s/sx of bleeding.  Antonietta Jewel, PharmD, BCCCP Clinical Pharmacist  Phone: [phone]  Please check AMION for all [place] phone numbers After 10:00 PM, call [place] [phone] 05/08/2019,9:59 AM

## 2019-05-09 DIAGNOSIS — S32012A Unstable burst fracture of first lumbar vertebra, initial encounter for closed fracture: Secondary | ICD-10-CM | POA: Diagnosis not present

## 2019-05-09 DIAGNOSIS — I5022 Chronic systolic (congestive) heart failure: Secondary | ICD-10-CM | POA: Diagnosis not present

## 2019-05-09 DIAGNOSIS — I482 Chronic atrial fibrillation, unspecified: Secondary | ICD-10-CM | POA: Diagnosis not present

## 2019-05-09 DIAGNOSIS — J9611 Chronic respiratory failure with hypoxia: Secondary | ICD-10-CM | POA: Diagnosis not present

## 2019-05-09 DIAGNOSIS — L8990 Pressure ulcer of unspecified site, unspecified stage: Secondary | ICD-10-CM | POA: Insufficient documentation

## 2019-05-09 LAB — GLUCOSE, CAPILLARY
Glucose-Capillary: 209 mg/dL — ABNORMAL HIGH (ref 70–99)
Glucose-Capillary: 203 mg/dL — ABNORMAL HIGH (ref 70–99)
Glucose-Capillary: 164 mg/dL — ABNORMAL HIGH (ref 70–99)
Glucose-Capillary: 143 mg/dL — ABNORMAL HIGH (ref 70–99)

## 2019-05-09 LAB — COMPREHENSIVE METABOLIC PANEL
ALT: 19 U/L (ref 0–44)
AST: 32 U/L (ref 15–41)
Albumin: 2.6 g/dL — ABNORMAL LOW (ref 3.5–5.0)
Alkaline Phosphatase: 58 U/L (ref 38–126)
Anion gap: 10 (ref 5–15)
BUN: 40 mg/dL — ABNORMAL HIGH (ref 8–23)
CO2: 25 mmol/L (ref 22–32)
Calcium: 8.5 mg/dL — ABNORMAL LOW (ref 8.9–10.3)
Chloride: 100 mmol/L (ref 98–111)
Creatinine, Ser: 1.2 mg/dL (ref 0.61–1.24)
GFR calc Af Amer: >60 mL/min (ref >60)
GFR calc non Af Amer: >60 mL/min (ref >60)
Glucose, Bld: 207 mg/dL — ABNORMAL HIGH (ref 70–99)
Potassium: 4.7 mmol/L (ref 3.5–5.1)
Sodium: 135 mmol/L (ref 135–145)
Total Bilirubin: 0.8 mg/dL (ref 0.3–1.2)
Total Protein: 5.8 g/dL — ABNORMAL LOW (ref 6.5–8.1)

## 2019-05-09 LAB — CBC WITH DIFFERENTIAL/PLATELET
Abs Immature Granulocytes: 0.04 10*3/uL (ref 0.00–0.07)
Basophils Absolute: 0 10*3/uL (ref 0.0–0.1)
Basophils Relative: 0 %
Eosinophils Absolute: 0 10*3/uL (ref 0.0–0.5)
Eosinophils Relative: 0 %
HCT: 39.3 % (ref 39.0–52.0)
Hemoglobin: 12.2 g/dL — ABNORMAL LOW (ref 13.0–17.0)
Immature Granulocytes: 0 %
Lymphocytes Relative: 2 %
Lymphs Abs: 0.2 10*3/uL — ABNORMAL LOW (ref 0.7–4.0)
MCH: 27.5 pg (ref 26.0–34.0)
MCHC: 31 g/dL (ref 30.0–36.0)
MCV: 88.7 fL (ref 80.0–100.0)
Monocytes Absolute: 0.6 10*3/uL (ref 0.1–1.0)
Monocytes Relative: 6 %
Neutro Abs: 9.1 10*3/uL — ABNORMAL HIGH (ref 1.7–7.7)
Neutrophils Relative %: 92 %
Platelets: 107 10*3/uL — ABNORMAL LOW (ref 150–400)
RBC: 4.43 MIL/uL (ref 4.22–5.81)
RDW: 15.3 % (ref 11.5–15.5)
WBC: 9.9 10*3/uL (ref 4.0–10.5)
nRBC: 0 % (ref 0.0–0.2)

## 2019-05-09 LAB — C-REACTIVE PROTEIN: CRP: 21.2 mg/dL — ABNORMAL HIGH (ref <1.0)

## 2019-05-09 LAB — APTT
aPTT: 62 seconds — ABNORMAL HIGH (ref 24–36)
aPTT: 74 seconds — ABNORMAL HIGH (ref 24–36)
aPTT: 78 seconds — ABNORMAL HIGH (ref 24–36)

## 2019-05-09 LAB — HEPARIN LEVEL (UNFRACTIONATED): Heparin Unfractionated: 0.74 IU/mL — ABNORMAL HIGH (ref 0.30–0.70)

## 2019-05-09 LAB — D-DIMER, QUANTITATIVE: D-Dimer, Quant: 2.3 ug/mL-FEU — ABNORMAL HIGH (ref 0.00–0.50)

## 2019-05-09 LAB — MAGNESIUM: Magnesium: 1.9 mg/dL (ref 1.7–2.4)

## 2019-05-09 LAB — PROCALCITONIN: Procalcitonin: 0.27 ng/mL

## 2019-05-09 LAB — FERRITIN: Ferritin: 229 ng/mL (ref 24–336)

## 2019-05-09 MED ORDER — MAGNESIUM SULFATE 2 GM/50ML IV SOLN
2.0000 g | Freq: Once | INTRAVENOUS | Status: AC
  Administered 2019-05-09: 2 g via INTRAVENOUS
  Filled 2019-05-09: qty 50

## 2019-05-09 MED ORDER — MUPIROCIN 2 % EX OINT
1.0000 "application " | TOPICAL_OINTMENT | Freq: Two times a day (BID) | CUTANEOUS | Status: AC
  Administered 2019-05-09 – 2019-05-13 (×10): 1 via NASAL
  Filled 2019-05-09 (×5): qty 22

## 2019-05-09 NOTE — Progress Notes (Addendum)
Daily Nursing Note  Received report from [place], [place]. Introduced self to patient who was sleepy at that time. Able to wean from 4LPM [place] to 1LPM [place] throughout the day. Patient encouraged to drink fluids throughout the day, he had a lack of appetite. No notable findings with neurological checks other than right sided LE weakness. Heparin gtt remains at 23units/hr. Patient throwing frequent PVC's, magnesium level ordered --> 2g of IV Mg provided. Pain level stable throughout the day 3-4/10, did not require additional morphine. Plan for re-evaluation by neurosurgery later in the week to identify probability of intervention. All patient needs met throughout the day.

## 2019-05-09 NOTE — Progress Notes (Signed)
PROGRESS NOTE  Cody Hale ZOX:[id number] DOB: 07/09/1950 DOA: 05/06/2019 PCP: [name], MD  HPI/Recap of past 24 hours: HPI from Dr Cody Hale is a 68 y.o. 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  Patient fell onto his buttock. Due to significant pain, pt was brought to the ER.  Denies any incontinence of urine or bowel. In the ED, at [facility], CT showed unstable lumbar fracture and patient was transferred to [facility].  Patient had Covid test done which was positive. Patient reports some productive cough. CXR showing vascular congestion, otherwise unremarkable.  Other labs show BNP of 326 LDH 331 CRP 14.7 WBC 11.9 hemoglobin 13.8 platelets 104 creatinine 1.3. Neurosurgery has been consulted. Patient is being admitted for further management of lumbar fracture.     Today, patient denies any new complaints.  Still complaining of back pain, requiring multiple doses of morphine, denies any weakness/numbness in any extremity.  Denies worsening shortness of breath/cough, no noted fever/chills.  Denies chest pain, abdominal pain, nausea/vomiting, diarrhea.     Assessment/Plan: Active Problems:   OSA (obstructive sleep apnea)   Type II diabetes mellitus with manifestations (HCC)   Chronic systolic CHF (congestive heart failure) (HCC)   Hyperlipidemia with target LDL less than 70   Chronic atrial fibrillation   Chronic respiratory failure with hypoxia (HCC)   Unstable burst fracture of unspecified lumbar vertebra, initial encounter for closed fracture (HCC)   Closed unstable burst fracture of lumbar vertebra (HCC)   Closed unstable burst fracture of first lumbar vertebra (HCC)   Pressure injury of skin  Unstable L1-L2 fracture Complains of back pain  Neurosurgery on board, plan for surgery early next week, but will require cardiology clearance Cardiology consulted for medical clearance, cleared for surgery Spoke to neurosurgery Dr. Conchita Paris on 05/07/2019, who was in agreement with starting patient on heparin drip and to closely monitor for any neuro change (risk of hematoma in pt with spinal fracture) Neurochecks every 4 hours while on heparin drip Strict bed rest, remain flat Continue to hold Xarelto, pain management Further management per neurosurgery  COVID-19 viral infection Currently desaturating while sleeping, has a history of OSA, cannot use CPAP due to Covid infection Afebrile, some leukocytosis CRP 14.7-->19.9--> 21.2 (history of ankylosing spondylitis) D-dimer greater than 20 (??Hx of ankylosing spondylitis) Patient with high risk of thromboembolic events, continue heparin drip for [facility] Lower extremity Doppler negative for DVT Chest x-ray showing vascular congestion Continue Decadron, remdesivir Trend inflammatory markers  Thrombocytopenia ?? from COVID-19 infection Daily CBC  Chronic A. Fib Rate controlled Continue Coreg Hold Xarelto Continue heparin drip  Chronic combined systolic and diastolic HF/history of bioprosthetic MVR/multiple PVCs noted on telemetry Appears compensated BNP 326 Last EF was 40 to 45% in November 2019 Monitor electrolytes potassium, magnesium, replace as needed Continue torsemide, Coreg  Diabetes mellitus type 2 Continue SSI, Accu-Cheks, hypoglycemic protocol  Hyperlipidemia Continue statins            Malnutrition Type:      Malnutrition Characteristics:      Nutrition Interventions:       Estimated body mass index is 39.43 kg/m as calculated from the following:   Height as of this encounter:  (1.753 m).   Weight as of this encounter: 121.1 kg.     Code Status: Full  Family Communication: None at bedside  Disposition Plan: To be determined    Consultants:  Neurosurgery  Procedures:  None  Antimicrobials:  None  DVT prophylaxis: Heparin drip   Objective: Vitals:   05/08/19 2202 05/09/19 0436 05/09/19 0759 05/09/19 1129  BP: 112/86  128/64 (!) 145/67  Pulse: 68 81 66 (!) 59  Resp: (!) 21 17 20 16   Temp: 98.6 F (37 C) 98.8 F (37.1 C) 98.5 F (36.9 C) 98.3 F (36.8 C)  TempSrc:  Oral Oral Oral  SpO2: 100%  100% 97%  Weight:      Height:        Intake/Output Summary (Last 24 hours) at 05/09/2019 1332 Last data filed at 05/09/2019 1200 Gross per 24 hour  Intake 834.38 ml  Output 4325 ml  Net -3490.62 ml   Filed Weights   05/06/19 1457  Weight: 121.1 kg    Exam:  General: NAD   Cardiovascular: S1, S2 present  Respiratory:  Bibasilar crackles noted  Abdomen: Soft, nontender, nondistended, bowel sounds present  Musculoskeletal: No bilateral pedal edema noted  Skin: Normal  Psychiatry: Normal mood  Neurology: No obvious focal neurologic deficits noted, limited range of motion due to pain  Data Reviewed: CBC: Recent Labs  Lab 05/06/19 1552 05/08/19 0500 05/09/19 0521  WBC 11.9* 11.3* 9.9  NEUTROABS 10.7* 10.3* 9.1*  HGB 13.8 12.4* 12.2*  HCT 45.5 40.2 39.3  MCV 90.3 90.1 88.7  PLT 104* 108* 831*   Basic Metabolic Panel: Recent Labs  Lab 05/06/19 1552 05/06/19 2327 05/08/19 0500 05/09/19 0521 05/09/19 1225  NA 137 137 137 135  --   K 3.7 3.8 4.8 4.7  --   CL 102 103 101 100  --   CO2 25 25 25 25   --   GLUCOSE 211* 196* 221* 207*  --   BUN 19 20 36* 40*  --   CREATININE 1.31* 1.18 1.33* 1.20  --   CALCIUM 8.5* 8.3* 8.7* 8.5*  --   MG  --   --   --   --  1.9   GFR: Estimated Creatinine Clearance: 75.8 mL/min (by C-G formula based on SCr of 1.2 mg/dL). Liver Function Tests: Recent Labs  Lab 05/06/19 2327 05/08/19 0500 05/09/19 0521  AST 70* 43* 32  ALT 22 20 19   ALKPHOS 64 60 58  BILITOT 1.1 0.7 0.8  PROT 7.3 6.0* 5.8*  ALBUMIN 3.4* 2.8* 2.6*   No results for  input(s): LIPASE, AMYLASE in the last 168 hours. No results for input(s): AMMONIA in the last 168 hours. Coagulation Profile: Recent Labs  Lab 05/06/19 1552  INR 1.2   Cardiac Enzymes: No results for input(s): CKTOTAL, CKMB, CKMBINDEX, TROPONINI in the last 168 hours. BNP (last 3 results) No results for input(s): PROBNP in the last 8760 hours. HbA1C: No results for input(s): HGBA1C in the last 72 hours. CBG: Recent Labs  Lab 05/08/19 0802 05/08/19 1227 05/08/19 1710 05/09/19 0754 05/09/19 1112  GLUCAP 231* 197* 191* 209* 203*   Lipid Profile: No results for input(s): CHOL, HDL, LDLCALC, TRIG, CHOLHDL, LDLDIRECT in the last 72 hours. Thyroid Function Tests: No results for input(s): TSH, T4TOTAL, FREET4, T3FREE, THYROIDAB in the last 72 hours. Anemia Panel: Recent Labs    05/08/19 0500 05/09/19 0521  FERRITIN 212 229   Urine analysis:    Component Value Date/Time   COLORURINE YELLOW 07/22/2018 [place] 07/22/2018 1604   APPEARANCEUR Clear 07/23/2017   LABSPEC 1.012 07/22/2018  1604   PHURINE 7.5 07/22/2018 1604   GLUCOSEU NEGATIVE 07/22/2018 1604   GLUCOSEU 500 (A) 07/14/2017 1053   HGBUR NEGATIVE 07/22/2018 1604   BILIRUBINUR NEGATIVE 05/21/2018 2251   BILIRUBINUR Negative 07/23/2017   KETONESUR NEGATIVE 07/22/2018 1604   PROTEINUR NEGATIVE 07/22/2018 1604   UROBILINOGEN 0.2 07/14/2017 1053   NITRITE NEGATIVE 07/22/2018 1604   LEUKOCYTESUR 2+ (A) 07/22/2018 1604   Sepsis Labs: @LABRCNTIP (procalcitonin:4,lacticidven:4)  ) Recent Results (from the past 240 hour(s))  SARS Coronavirus 2 by RT PCR (hospital order, performed in [facility] hospital lab) Nasopharyngeal Nasopharyngeal Swab     Status: Abnormal   Collection Time: 05/06/19  6:25 PM   Specimen: Nasopharyngeal Swab  Result Value Ref Range Status   SARS Coronavirus 2 POSITIVE (A) NEGATIVE Final    Comment: RESULT CALLED TO, READ BACK BY AND VERIFIED WITH: J APPELT,RN@2021  05/06/19  MKELLY (NOTE) If result is NEGATIVE SARS-CoV-2 target nucleic acids are NOT DETECTED. The SARS-CoV-2 RNA is generally detectable in upper and lower  respiratory specimens during the acute phase of infection. The lowest  concentration of SARS-CoV-2 viral copies this assay can detect is 250  copies / mL. A negative result does not preclude SARS-CoV-2 infection  and should not be used as the sole basis for treatment or other  patient management decisions.  A negative result may occur with  improper specimen collection / handling, submission of specimen other  than nasopharyngeal swab, presence of viral mutation(s) within the  areas targeted by this assay, and inadequate number of viral copies  (<250 copies / mL). A negative result must be combined with clinical  observations, patient history, and epidemiological information. If result is POSITIVE SARS-CoV-2 target nucleic acids are DETECTED. The  SARS-CoV-2 RNA is generally detectable in upper and lower  respiratory specimens during the acute phase of infection.  Positive  results are indicative of active infection with SARS-CoV-2.  Clinical  correlation with patient history and other diagnostic information is  necessary to determine patient infection status.  Positive results do  not rule out bacterial infection or co-infection with other viruses. If result is PRESUMPTIVE POSTIVE SARS-CoV-2 nucleic acids MAY BE PRESENT.   A presumptive positive result was obtained on the submitted specimen  and confirmed on repeat testing.  While 2019 novel coronavirus  (SARS-CoV-2) nucleic acids may be present in the submitted sample  additional confirmatory testing may be necessary for epidemiological  and / or clinical management purposes  to differentiate between  SARS-CoV-2 and other Sarbecovirus currently known to infect humans.  If clinically indicated additional testing with an alternate test  methodology [phone]) is ad vised. The SARS-CoV-2  RNA is generally  detectable in upper and lower respiratory specimens during the acute  phase of infection. The expected result is Negative. Fact Sheet for Patients:  BoilerBrush.com.cy Fact Sheet for Healthcare Providers: https://pope.com/ This test is not yet approved or cleared by the [place] FDA and has been authorized for detection and/or diagnosis of SARS-CoV-2 by FDA under an Emergency Use Authorization (EUA).  This EUA will remain in effect (meaning this test can be used) for the duration of the COVID-19 declaration under Section 564(b)(1) of the Act, 21 U.S.C. section 360bbb-3(b)(1), unless the authorization is terminated or revoked sooner. Performed at [facility], [address] [place], [place] [address]       Studies: Vas [place] Lower Extremity Venous (dvt)  Result Date: 05/08/2019  Lower Venous Study Indications: Covid positive, elevated D-Dimer. Has unstable lumbar fracture.  Limitations: Patient in severe pain with compression and body habitus. Comparison Study: No prior study on file for comparison Performing Technologist: Sherren Kernsandace Kanady RVS  Examination Guidelines: A complete evaluation includes B-mode imaging, spectral Doppler, color Doppler, and power Doppler as needed of all accessible portions of each vessel. Bilateral testing is considered an integral part of a complete examination. Limited examinations for reoccurring indications may be performed as noted.  +---------+---------------+---------+-----------+----------+--------------+ RIGHT    CompressibilityPhasicitySpontaneityPropertiesThrombus Aging +---------+---------------+---------+-----------+----------+--------------+ CFV      Full           Yes      Yes                                 +---------+---------------+---------+-----------+----------+--------------+ SFJ      Full                                                         +---------+---------------+---------+-----------+----------+--------------+ FV Prox  Full                                                        +---------+---------------+---------+-----------+----------+--------------+ FV Mid   Full                                                        +---------+---------------+---------+-----------+----------+--------------+ FV DistalFull                                                        +---------+---------------+---------+-----------+----------+--------------+ PFV      Full                                                        +---------+---------------+---------+-----------+----------+--------------+ POP      Full           Yes      Yes                                 +---------+---------------+---------+-----------+----------+--------------+ PTV      Full                                                        +---------+---------------+---------+-----------+----------+--------------+ PERO     Full                                                        +---------+---------------+---------+-----------+----------+--------------+   +---------+---------------+---------+-----------+----------+--------------+  LEFT     CompressibilityPhasicitySpontaneityPropertiesThrombus Aging +---------+---------------+---------+-----------+----------+--------------+ CFV      Full           Yes      Yes                                 +---------+---------------+---------+-----------+----------+--------------+ SFJ      Full                                                        +---------+---------------+---------+-----------+----------+--------------+ FV Prox  Full                                                        +---------+---------------+---------+-----------+----------+--------------+ FV Mid   Full                                                         +---------+---------------+---------+-----------+----------+--------------+ FV DistalFull                                                        +---------+---------------+---------+-----------+----------+--------------+ PFV      Full                                                        +---------+---------------+---------+-----------+----------+--------------+ POP      Full           Yes      Yes                                 +---------+---------------+---------+-----------+----------+--------------+ PTV      Full                                                        +---------+---------------+---------+-----------+----------+--------------+ PERO     Full                                                        +---------+---------------+---------+-----------+----------+--------------+     Summary: Right: There is no evidence of deep vein thrombosis in the lower extremity. Left: There is no evidence of deep vein thrombosis in the lower extremity.  *See table(s) above for measurements and observations. Electronically signed by Waverly Ferrarihristopher Dickson MD on 05/08/2019  at 5:56:44 PM.    Final     Scheduled Meds: . carvedilol  3.125 mg Oral BID  . Chlorhexidine Gluconate Cloth  6 each Topical Daily  . dexamethasone (DECADRON) injection  6 mg Intravenous Q24H  . fluticasone  2 spray Each Nare Daily  . fluticasone furoate-vilanterol  1 puff Inhalation Daily  . insulin aspart  0-9 Units Subcutaneous TID WC  . insulin glargine  30 Units Subcutaneous Daily  . loratadine  10 mg Oral QPM  . mupirocin ointment  1 application Nasal BID  . pantoprazole  40 mg Oral Daily  . polyethylene glycol  17 g Oral Daily  . potassium chloride SA  20 mEq Oral BID  . senna-docusate  2 tablet Oral BID  . torsemide  20 mg Oral q1800  . torsemide  40 mg Oral Daily  . umeclidinium bromide  1 puff Inhalation Daily  . vitamin C  500 mg Oral Daily  . zinc sulfate  220 mg Oral Daily     Continuous Infusions: . heparin 2,300 Units/hr (05/09/19 0702)  . magnesium sulfate bolus IVPB    . remdesivir 100 mg in NS 250 mL 100 mg (05/09/19 0210)     LOS: 3 days     Briant Cedar, MD [facility]  If 7PM-7AM, please contact night-coverage [email or web address] 05/09/2019, 1:32 PM

## 2019-05-09 NOTE — Progress Notes (Signed)
ANTICOAGULATION CONSULT NOTE - Follow Up Consult  Pharmacy Consult for heparin Indication: atrial fibrillation  Labs: Recent Labs    05/06/19 1552 05/06/19 2327 05/08/19 0224 05/08/19 0500 05/08/19 1240 05/08/19 2000 05/09/19 0521  HGB 13.8  --   --  12.4*  --   --  12.2*  HCT 45.5  --   --  40.2  --   --  39.3  PLT 104*  --   --  108*  --   --  107*  APTT 36  --  39* 38* 54* 34 62*  LABPROT 15.0  --   --   --   --   --   --   INR 1.2  --   --   --   --   --   --   HEPARINUNFRC  --   --  0.55  --  0.76*  --  0.74*  CREATININE 1.31* 1.18  --  1.33*  --   --   --     Assessment: 68yo male remains subtherapeutic on heparin after rate change; no gtt issues or signs of bleeding per RN.  Goal of Therapy:  aPTT 66-102 seconds   Plan:  Will increase heparin gtt by 10% to 2300 units/hr and check PTT in 6 hours.    Wynona Neat, PharmD, BCPS  05/09/2019,6:50 AM

## 2019-05-09 NOTE — Progress Notes (Signed)
ANTICOAGULATION CONSULT NOTE - Follow Up Consult  Pharmacy Consult for heparin Indication: atrial fibrillation  Labs: Recent Labs    05/06/19 2327  05/08/19 0224 05/08/19 0500 05/08/19 1240  05/09/19 0521 05/09/19 1225 05/09/19 1948  HGB  --   --   --  12.4*  --   --  12.2*  --   --   HCT  --   --   --  40.2  --   --  39.3  --   --   PLT  --   --   --  108*  --   --  107*  --   --   APTT  --    < > 39* 38* 54*   < > 62* 74* 78*  HEPARINUNFRC  --   --  0.55  --  0.76*  --  0.74*  --   --   CREATININE 1.18  --   --  1.33*  --   --  1.20  --   --    < > = values in this interval not displayed.    Assessment: 62 YOM presented s/p fall with lumbar fracture.  Found to be positive for Covid-19 with d-dimer > 20. Patient was on Xarelto PTA for history of Afib, which to remain on hold for possible surgery (patient last took Xarelto on 05/05/19 AM).  Pharmacy has been consulted to dose heparin.  APTT came back therapeutic at 78, on 2300 units/hr. No bleeding or issues with the infusion per RN. Continue to obtain both aPTT and heparin levels until correlation.    Goal of Therapy:  Heparin level 0.3-0.7 units/ml aPTT 66 - 102 seconds Monitor platelets by anticoagulation protocol: Yes   Plan:  Continue heparin infusion at 2300 units/hr Monitor daily HL, aPTT, CBC, and for s/sx of bleeding.  Antonietta Jewel, PharmD, [place] Clinical Pharmacist  Phone: [phone]  Please check AMION for all [place] phone numbers After 10:00 PM, call [place] [phone]  05/09/2019   9:00 PM

## 2019-05-09 NOTE — Progress Notes (Signed)
ANTICOAGULATION CONSULT NOTE - Follow Up Consult  Pharmacy Consult for heparin Indication: atrial fibrillation  Labs: Recent Labs    05/06/19 1552 05/06/19 2327 05/08/19 0224 05/08/19 0500 05/08/19 1240 05/08/19 2000 05/09/19 0521 05/09/19 1225  HGB 13.8  --   --  12.4*  --   --  12.2*  --   HCT 45.5  --   --  40.2  --   --  39.3  --   PLT 104*  --   --  108*  --   --  107*  --   APTT 36  --  39* 38* 54* 34 62* 74*  LABPROT 15.0  --   --   --   --   --   --   --   INR 1.2  --   --   --   --   --   --   --   HEPARINUNFRC  --   --  0.55  --  0.76*  --  0.74*  --   CREATININE 1.31* 1.18  --  1.33*  --   --  1.20  --     Assessment: 60 YOM presented s/p fall with lumbar fracture.  Found to be positive for Covid-19 with d-dimer > 20. Patient was on Xarelto PTA for history of Afib, which to remain on hold for possible surgery (patient last took Xarelto on 05/05/19 AM).  Pharmacy has been consulted to dose heparin.  Today, aPTT is therapeutic at 74 after dose increase early this morning. CBC low stable with Hgb 12.2 and platelets 107 this morning. No bleeding or issues with the infusion per RN. Continue to obtain both aPTT and heparin levels until correlation.    Goal of Therapy:  Heparin level 0.3-0.7 units/ml aPTT 66 - 102 seconds Monitor platelets by anticoagulation protocol: Yes   Plan:  Continue heparin infusion at 2300 units/hr Check confirmatory aPTT in 6 hours Monitor daily HL, aPTT, CBC, and for s/sx of bleeding.   Brendolyn Patty, PharmD PGY2 Pharmacy Resident Phone [phone]  05/09/2019   1:23 PM

## 2019-05-10 ENCOUNTER — Encounter (HOSPITAL_COMMUNITY): Payer: Self-pay | Admitting: Certified Registered Nurse Anesthetist

## 2019-05-10 ENCOUNTER — Other Ambulatory Visit: Payer: Self-pay | Admitting: Neurosurgery

## 2019-05-10 ENCOUNTER — Inpatient Hospital Stay (HOSPITAL_COMMUNITY): Payer: Medicare Other

## 2019-05-10 DIAGNOSIS — I482 Chronic atrial fibrillation, unspecified: Secondary | ICD-10-CM | POA: Diagnosis not present

## 2019-05-10 DIAGNOSIS — J9611 Chronic respiratory failure with hypoxia: Secondary | ICD-10-CM | POA: Diagnosis not present

## 2019-05-10 DIAGNOSIS — I5022 Chronic systolic (congestive) heart failure: Secondary | ICD-10-CM | POA: Diagnosis not present

## 2019-05-10 DIAGNOSIS — S32012A Unstable burst fracture of first lumbar vertebra, initial encounter for closed fracture: Secondary | ICD-10-CM | POA: Diagnosis not present

## 2019-05-10 LAB — CBC WITH DIFFERENTIAL/PLATELET
Abs Immature Granulocytes: 0.05 10*3/uL (ref 0.00–0.07)
Basophils Absolute: 0 10*3/uL (ref 0.0–0.1)
Basophils Relative: 0 %
Eosinophils Absolute: 0 10*3/uL (ref 0.0–0.5)
Eosinophils Relative: 0 %
HCT: 37.8 % — ABNORMAL LOW (ref 39.0–52.0)
Hemoglobin: 12.2 g/dL — ABNORMAL LOW (ref 13.0–17.0)
Immature Granulocytes: 1 %
Lymphocytes Relative: 2 %
Lymphs Abs: 0.2 10*3/uL — ABNORMAL LOW (ref 0.7–4.0)
MCH: 27.9 pg (ref 26.0–34.0)
MCHC: 32.3 g/dL (ref 30.0–36.0)
MCV: 86.3 fL (ref 80.0–100.0)
Monocytes Absolute: 0.4 10*3/uL (ref 0.1–1.0)
Monocytes Relative: 4 %
Neutro Abs: 9.5 10*3/uL — ABNORMAL HIGH (ref 1.7–7.7)
Neutrophils Relative %: 93 %
Platelets: 111 10*3/uL — ABNORMAL LOW (ref 150–400)
RBC: 4.38 MIL/uL (ref 4.22–5.81)
RDW: 15.1 % (ref 11.5–15.5)
WBC: 10.2 10*3/uL (ref 4.0–10.5)
nRBC: 0 % (ref 0.0–0.2)

## 2019-05-10 LAB — COMPREHENSIVE METABOLIC PANEL
ALT: 18 U/L (ref 0–44)
AST: 24 U/L (ref 15–41)
Albumin: 2.5 g/dL — ABNORMAL LOW (ref 3.5–5.0)
Alkaline Phosphatase: 61 U/L (ref 38–126)
Anion gap: 10 (ref 5–15)
BUN: 40 mg/dL — ABNORMAL HIGH (ref 8–23)
CO2: 27 mmol/L (ref 22–32)
Calcium: 8.4 mg/dL — ABNORMAL LOW (ref 8.9–10.3)
Chloride: 98 mmol/L (ref 98–111)
Creatinine, Ser: 1.09 mg/dL (ref 0.61–1.24)
GFR calc Af Amer: >60 mL/min (ref >60)
GFR calc non Af Amer: >60 mL/min (ref >60)
Glucose, Bld: 208 mg/dL — ABNORMAL HIGH (ref 70–99)
Potassium: 4.5 mmol/L (ref 3.5–5.1)
Sodium: 135 mmol/L (ref 135–145)
Total Bilirubin: 0.9 mg/dL (ref 0.3–1.2)
Total Protein: 5.6 g/dL — ABNORMAL LOW (ref 6.5–8.1)

## 2019-05-10 LAB — URINALYSIS, ROUTINE W REFLEX MICROSCOPIC
Bilirubin Urine: NEGATIVE
Glucose, UA: NEGATIVE mg/dL
Ketones, ur: NEGATIVE mg/dL
Leukocytes,Ua: NEGATIVE
Nitrite: NEGATIVE
Protein, ur: NEGATIVE mg/dL
Specific Gravity, Urine: 1.012 (ref 1.005–1.030)
pH: 5 (ref 5.0–8.0)

## 2019-05-10 LAB — FERRITIN: Ferritin: 262 ng/mL (ref 24–336)

## 2019-05-10 LAB — GLUCOSE, CAPILLARY
Glucose-Capillary: 226 mg/dL — ABNORMAL HIGH (ref 70–99)
Glucose-Capillary: 228 mg/dL — ABNORMAL HIGH (ref 70–99)
Glucose-Capillary: 196 mg/dL — ABNORMAL HIGH (ref 70–99)
Glucose-Capillary: 95 mg/dL (ref 70–99)

## 2019-05-10 LAB — PROCALCITONIN: Procalcitonin: 0.31 ng/mL

## 2019-05-10 LAB — C-REACTIVE PROTEIN: CRP: 21.2 mg/dL — ABNORMAL HIGH (ref <1.0)

## 2019-05-10 LAB — LACTIC ACID, PLASMA
Lactic Acid, Venous: 2.3 mmol/L (ref 0.5–1.9)
Lactic Acid, Venous: 3.5 mmol/L (ref 0.5–1.9)

## 2019-05-10 LAB — APTT: aPTT: 90 seconds — ABNORMAL HIGH (ref 24–36)

## 2019-05-10 LAB — HEPARIN LEVEL (UNFRACTIONATED): Heparin Unfractionated: 0.58 IU/mL (ref 0.30–0.70)

## 2019-05-10 LAB — D-DIMER, QUANTITATIVE: D-Dimer, Quant: 1.5 ug/mL-FEU — ABNORMAL HIGH (ref 0.00–0.50)

## 2019-05-10 MED ORDER — FUROSEMIDE 10 MG/ML IJ SOLN
40.0000 mg | Freq: Once | INTRAMUSCULAR | Status: AC
  Administered 2019-05-10: 40 mg via INTRAVENOUS
  Filled 2019-05-10: qty 4

## 2019-05-10 MED ORDER — INSULIN GLARGINE 100 UNIT/ML ~~LOC~~ SOLN
32.0000 [IU] | Freq: Every day | SUBCUTANEOUS | Status: DC
  Administered 2019-05-11 – 2019-05-13 (×3): 32 [IU] via SUBCUTANEOUS
  Filled 2019-05-10 (×4): qty 0.32

## 2019-05-10 MED ORDER — INSULIN ASPART 100 UNIT/ML ~~LOC~~ SOLN
3.0000 [IU] | Freq: Three times a day (TID) | SUBCUTANEOUS | Status: DC
  Administered 2019-05-11 – 2019-05-13 (×8): 3 [IU] via SUBCUTANEOUS

## 2019-05-10 MED ORDER — INSULIN ASPART 100 UNIT/ML ~~LOC~~ SOLN
0.0000 [IU] | Freq: Every day | SUBCUTANEOUS | Status: DC
  Administered 2019-05-11: 3 [IU] via SUBCUTANEOUS
  Administered 2019-05-12 – 2019-05-14 (×2): 2 [IU] via SUBCUTANEOUS

## 2019-05-10 MED ORDER — METOPROLOL TARTRATE 5 MG/5ML IV SOLN
5.0000 mg | Freq: Three times a day (TID) | INTRAVENOUS | Status: DC | PRN
  Administered 2019-05-10 (×2): 5 mg via INTRAVENOUS
  Filled 2019-05-10 (×2): qty 5

## 2019-05-10 NOTE — Progress Notes (Signed)
PROGRESS NOTE  Cody Hale [id number]:[id number] DOB: May 03, 1951 DOA: 05/06/2019 PCP: [name], MD  HPI/Recap of past 24 hours: HPI from Dr Nils Flack Daubert is a 68 y.o. 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  Patient fell onto his buttock. Due to significant pain, pt was brought to the ER.  Denies any incontinence of urine or bowel. In the ED, at [facility], CT showed unstable lumbar fracture and patient was transferred to [facility].  Patient had Covid test done which was positive. Patient reports some productive cough. CXR showing vascular congestion, otherwise unremarkable.  Other labs show BNP of 326 LDH 331 CRP 14.7 WBC 11.9 hemoglobin 13.8 platelets 104 creatinine 1.3. Neurosurgery has been consulted. Patient is being admitted for further management of lumbar fracture.     Today, patient still complains of significant back pain, denies worsening shortness of breath or cough, no fever/chills noted.  Noted to have multiple PVCs, bigeminy is on telemetry.   Assessment/Plan: Active Problems:   OSA (obstructive sleep apnea)   Type II diabetes mellitus with manifestations (HCC)   Chronic systolic CHF (congestive heart failure) (HCC)   Hyperlipidemia with target LDL less than 70   Chronic atrial fibrillation   Chronic respiratory failure with hypoxia (HCC)   Unstable burst fracture of unspecified lumbar vertebra, initial encounter for closed fracture (HCC)   Closed unstable burst fracture of lumbar vertebra (HCC)   Closed unstable burst fracture of first lumbar vertebra (HCC)   Pressure injury of skin  Unstable L1-L2 fracture Complains of back pain Neurosurgery on board, plan for surgery sometime this week Cardiology consulted, cleared patient for surgery, signed off.   Reconsulted cardiology for significant PVCs, bigeminy, awaiting further recommendation Spoke to neurosurgery Dr. Conchita Paris on 05/07/2019, who was in agreement with starting patient on heparin drip and to closely monitor for any neuro change (risk of hematoma in pt with spinal fracture) Neurochecks every 4 hours while on heparin drip Strict bed rest, remain flat Continue to hold Xarelto, pain management Further management per neurosurgery  COVID-19 viral infection Currently desaturates while sleeping, has a history of OSA, cannot use CPAP due to Covid infection Afebrile, no leukocytosis CRP 14.7-->19.9--> 21.2 (history of ankylosing spondylitis) D-dimer greater than 20 on admission (??Hx of ankylosing spondylitis) Patient with high risk of thromboembolic events, continue heparin drip for [facility] Lower extremity Doppler negative for DVT Chest x-ray showing vascular congestion Continue Decadron, remdesivir Trend inflammatory markers  Thrombocytopenia ?? from COVID-19 infection Daily CBC  Chronic A. Fib Rate controlled Continue Coreg Hold Xarelto Continue heparin drip  Chronic combined systolic and diastolic HF/history of bioprosthetic MVR/multiple PVCs noted on telemetry Appears compensated BNP 326 Last EF was 40 to 45% in November 2019 Cardiology reconsulted for multiple PVCs/bigeminy Monitor electrolytes potassium, magnesium, replace as needed Continue torsemide, Coreg  Diabetes mellitus type 2 Continue SSI, Accu-Cheks, hypoglycemic protocol  Hyperlipidemia Continue statins        Malnutrition Type:      Malnutrition Characteristics:      Nutrition Interventions:       Estimated body mass index is 39.43 kg/m as calculated from the following:   Height as of this encounter: 5\' 9"  (1.753 m).   Weight as of this encounter: 121.1 kg.     Code Status: Full  Family Communication: None at bedside  Disposition Plan: To be determined   Consultants:   Neurosurgery  Cardiology  Procedures:  None  Antimicrobials:  None  DVT prophylaxis: Heparin drip   Objective: Vitals:   05/09/19 2210 05/09/19 2307 05/10/19 0828 05/10/19 1300  BP:  126/82 122/66 124/90  Pulse: 78  (!) 51 (!) 58  Resp: 20  (!) 22   Temp: (!) 97.4 F (36.3 C)   99.1 F (37.3 C)  TempSrc: Oral   Oral  SpO2: 95%  98% 95%  Weight:      Height:        Intake/Output Summary (Last 24 hours) at 05/10/2019 1444 Last data filed at 05/10/2019 0738 Gross per 24 hour  Intake 647.85 ml  Output 1850 ml  Net -1202.15 ml   Filed Weights   05/06/19 1457  Weight: 121.1 kg    Exam:  General: NAD  Cardiovascular: S1, S2 present  Respiratory:  Bibasilar crackles noted bilaterally  Abdomen: Soft, nontender, nondistended, bowel sounds present  Musculoskeletal: No bilateral pedal edema noted  Skin: Normal  Psychiatry: Normal mood   Neurology: No obvious focal neurologic deficits noted, able to wiggle toes, unable to lift right lower extremity due to significant back tenderness, 4/5 strength in left lower extremity  Data Reviewed: CBC: Recent Labs  Lab 05/06/19 1552 05/08/19 0500 05/09/19 0521 05/10/19 0406  WBC 11.9* 11.3* 9.9 10.2  NEUTROABS 10.7* 10.3* 9.1* 9.5*  HGB 13.8 12.4* 12.2* 12.2*  HCT 45.5 40.2 39.3 37.8*  MCV 90.3 90.1 88.7 86.3  PLT 104* 108* 107* 111*   Basic Metabolic Panel: Recent Labs  Lab 05/06/19 1552 05/06/19 2327 05/08/19 0500 05/09/19 0521 05/09/19 1225 05/10/19 0406  NA 137 137 137 135  --  135  K 3.7 3.8 4.8 4.7  --  4.5  CL 102 103 101 100  --  98  CO2 25 25 25 25   --  27  GLUCOSE 211* 196* 221* 207*  --  208*  BUN 19 20 36* 40*  --  40*  CREATININE 1.31* 1.18 1.33* 1.20  --  1.09  CALCIUM 8.5* 8.3* 8.7* 8.5*  --  8.4*  MG  --   --   --   --  1.9  --    GFR: Estimated Creatinine Clearance: 83.4 mL/min (by C-G formula based on SCr of 1.09 mg/dL). Liver Function Tests: Recent Labs  Lab 05/06/19 2327  05/08/19 0500 05/09/19 0521 05/10/19 0406  AST 70* 43* 32 24  ALT 22 20 19 18   ALKPHOS 64 60 58 61  BILITOT 1.1 0.7 0.8 0.9  PROT 7.3 6.0* 5.8* 5.6*  ALBUMIN 3.4* 2.8* 2.6* 2.5*   No results for input(s): LIPASE, AMYLASE in the last 168 hours. No results for input(s): AMMONIA in the last 168 hours. Coagulation Profile: Recent Labs  Lab 05/06/19 1552  INR 1.2   Cardiac Enzymes: No results for input(s): CKTOTAL, CKMB, CKMBINDEX, TROPONINI in the last 168 hours. BNP (last 3 results) No results for input(s): PROBNP in the last 8760 hours. HbA1C: No results for input(s): HGBA1C in the last 72 hours. CBG: Recent Labs  Lab 05/09/19 1112 05/09/19 1526 05/09/19 2159 05/10/19 0826 05/10/19 1248  GLUCAP 203* 164* 143* 226* 228*   Lipid Profile: No results for input(s): CHOL, HDL, LDLCALC, TRIG, CHOLHDL, LDLDIRECT in the last 72 hours. Thyroid Function Tests: No results for input(s): TSH, T4TOTAL, FREET4, T3FREE, THYROIDAB in the last 72 hours. Anemia Panel: Recent Labs    05/09/19 0521 05/10/19 0406  FERRITIN 229 262   Urine analysis:    Component Value Date/Time   COLORURINE YELLOW 07/22/2018 [place] 07/22/2018 1604   APPEARANCEUR Clear 07/23/2017   LABSPEC 1.012 07/22/2018 1604   PHURINE 7.5 07/22/2018 1604   GLUCOSEU NEGATIVE 07/22/2018 1604   GLUCOSEU 500 (A) 07/14/2017 1053   HGBUR NEGATIVE 07/22/2018 [place] 05/21/2018 2251   BILIRUBINUR Negative 07/23/2017   KETONESUR NEGATIVE 07/22/2018 1604   PROTEINUR NEGATIVE 07/22/2018 1604   UROBILINOGEN 0.2 07/14/2017 1053   NITRITE NEGATIVE 07/22/2018 1604   LEUKOCYTESUR 2+ (A) 07/22/2018 1604   Sepsis Labs: @LABRCNTIP (procalcitonin:4,lacticidven:4)  ) Recent Results (from the past 240 hour(s))  SARS Coronavirus 2 by RT PCR (hospital order, performed in [place] hospital lab) Nasopharyngeal Nasopharyngeal Swab     Status: Abnormal   Collection Time: 05/06/19  6:25 PM    Specimen: Nasopharyngeal Swab  Result Value Ref Range Status   SARS Coronavirus 2 POSITIVE (A) NEGATIVE Final    Comment: RESULT CALLED TO, READ BACK BY AND VERIFIED WITH: J APPELT,RN@2021  05/06/19 MKELLY (NOTE) If result is NEGATIVE SARS-CoV-2 target nucleic acids are NOT DETECTED. The SARS-CoV-2 RNA is generally detectable in upper and lower  respiratory specimens during the acute phase of infection. The lowest  concentration of SARS-CoV-2 viral copies this assay can detect is 250  copies / mL. A negative result does not preclude SARS-CoV-2 infection  and should not be used as the sole basis for treatment or other  patient management decisions.  A negative result may occur with  improper specimen collection / handling, submission of specimen other  than nasopharyngeal swab, presence of viral mutation(s) within the  areas targeted by this assay, and inadequate number of viral copies  (<250 copies / mL). A negative result must be combined with clinical  observations, patient history, and epidemiological information. If result is POSITIVE SARS-CoV-2 target nucleic acids are DETECTED. The  SARS-CoV-2 RNA is generally detectable in upper and lower  respiratory specimens during the acute phase of infection.  Positive  results are indicative of active infection with SARS-CoV-2.  Clinical  correlation with patient history and other diagnostic information is  necessary to determine patient infection status.  Positive results do  not rule out bacterial infection or co-infection with other viruses. If result is PRESUMPTIVE POSTIVE SARS-CoV-2 nucleic acids MAY BE PRESENT.   A presumptive positive result was obtained on the submitted specimen  and confirmed on repeat testing.  While 2019 novel coronavirus  (SARS-CoV-2) nucleic acids may be present in the submitted sample  additional confirmatory testing may be necessary for epidemiological  and / or clinical management purposes  to  differentiate between  SARS-CoV-2 and other Sarbecovirus currently known to infect humans.  If clinically indicated additional testing with an alternate test  methodology [phone]) is ad vised. The SARS-CoV-2 RNA is generally  detectable in upper and lower respiratory specimens during the acute  phase of infection. The expected result is Negative. Fact Sheet for Patients:  StrictlyIdeas.no Fact Sheet for Healthcare Providers: BankingDealers.co.za This test is not yet approved or cleared by the [place] FDA and has been authorized for detection and/or diagnosis of SARS-CoV-2 by FDA under an Emergency Use Authorization (EUA).  This EUA will remain in effect (meaning this test can be used) for the duration of the COVID-19 declaration under Section 564(b)(1) of the Act, 21 U.S.C. section 360bbb-3(b)(1), unless the authorization is terminated or revoked sooner. Performed at [facility], [phone]  [address] [place], [place] [address]       Studies: No results found.  Scheduled Meds: . carvedilol  3.125 mg Oral BID  . Chlorhexidine Gluconate Cloth  6 each Topical Daily  . dexamethasone (DECADRON) injection  6 mg Intravenous Q24H  . fluticasone  2 spray Each Nare Daily  . fluticasone furoate-vilanterol  1 puff Inhalation Daily  . insulin aspart  0-9 Units Subcutaneous TID WC  . insulin glargine  30 Units Subcutaneous Daily  . loratadine  10 mg Oral QPM  . mupirocin ointment  1 application Nasal BID  . pantoprazole  40 mg Oral Daily  . polyethylene glycol  17 g Oral Daily  . potassium chloride SA  20 mEq Oral BID  . senna-docusate  2 tablet Oral BID  . torsemide  20 mg Oral q1800  . torsemide  40 mg Oral Daily  . umeclidinium bromide  1 puff Inhalation Daily  . vitamin C  500 mg Oral Daily  . zinc sulfate  220 mg Oral Daily    Continuous Infusions: . heparin 2,300 Units/hr (05/10/19 1344)  . remdesivir 100 mg in NS 250 mL 100 mg  (05/10/19 0232)     LOS: 4 days     Briant CedarNkeiruka J Karlon Schlafer, MD [facility]  If 7PM-7AM, please contact night-coverage [email or web address] 05/10/2019, 2:44 PM

## 2019-05-10 NOTE — Progress Notes (Signed)
Inpatient Diabetes Program Recommendations  AACE/ADA: New Consensus Statement on Inpatient Glycemic Control  Target Ranges:  Prepandial:   less than 140 mg/dL      Peak postprandial:   less than 180 mg/dL (1-2 hours)      Critically ill patients:  140 - 180 mg/dL   Results for Cody Hale, Cody "DAVID" (MRN [id number]) as of 05/10/2019 15:02  Ref. Range 05/09/2019 07:54 05/09/2019 11:12 05/09/2019 15:26 05/09/2019 21:59 05/10/2019 08:26 05/10/2019 12:48  Glucose-Capillary Latest Ref Range: 70 - 99 mg/dL 209 (H) 203 (H) 164 (H) 143 (H) 226 (H) 228 (H)   Review of Glycemic Control  Diabetes history: DM2 Outpatient Diabetes medications: Tresiba 50 units daily, Novolog 5 units TID with meals, Farxiga 10 mg daily, Trulicity 1.5 mg Qweek Current orders for Inpatient glycemic control: Lantus 30 units daily, Novolog 0-9 units TID with meals; Decadron 6 mg Q24H  Inpatient Diabetes Program Recommendations:   Insulin-Meal Coverage: If steroids are continued, please consider ordering Novolog 3 units TID with meals for meal coverage if patient eats at least 50% of meals.  Insulin-Correction: Please consider ordering Novolog 0-5 units QHS for bedtime correction.  Insulin-Basal: If steroids are continued, please consider increasing Lantus to 32 units daily.  Thanks, Barnie Alderman, RN, MSN, CDE Diabetes Coordinator Inpatient Diabetes Program [phone] (Team Pager from 8am to 5pm)

## 2019-05-10 NOTE — Progress Notes (Signed)
Patient was transported to CT for lumbar study prior to surgical intervention for fracture.  Pt is on heparin drip for clot prevention. Pt was moved to CT table with complaints of pain from patient. It was explained to him that the test was about 5 minutes long. After several minutes on the table the CT tech was unable to initiate the scan. Pt was returned to the bed with assistance from 3 people. Pt will have to be managed with pain medication and return to CT later this afternoon.

## 2019-05-10 NOTE — Progress Notes (Signed)
Pts progress reviewed, now with significantly elevated temp (104.7) of unclear origin. Will speak with primary service, however do not think that we should proceed with implantation of spinal hardware in a patient with possible active infection.

## 2019-05-10 NOTE — Progress Notes (Signed)
Pt developed an elevated temp. MD was contacted. Pt has ice packs placed in axillary and groin. Room temp lowered and fan placed at foot of bed.

## 2019-05-10 NOTE — Progress Notes (Signed)
ANTICOAGULATION CONSULT NOTE - Follow Up Consult  Pharmacy Consult for heparin Indication: atrial fibrillation  Labs: Recent Labs    05/08/19 0500 05/08/19 1240  05/09/19 0521 05/09/19 1225 05/09/19 1948 05/10/19 0406  HGB 12.4*  --   --  12.2*  --   --  12.2*  HCT 40.2  --   --  39.3  --   --  37.8*  PLT 108*  --   --  107*  --   --  111*  APTT 38* 54*   < > 62* 74* 78* 90*  HEPARINUNFRC  --  0.76*  --  0.74*  --   --  0.58  CREATININE 1.33*  --   --  1.20  --   --  1.09   < > = values in this interval not displayed.    Assessment: 54 YOM presented s/p fall with lumbar fracture.  Found to be positive for Covid-19 with d-dimer > 20. Patient was on Xarelto PTA for history of Afib, which to remain on hold for possible surgery (patient last took Xarelto on 05/05/19 AM).  Pharmacy has been consulted to dose heparin.  APTT and HL this morning are both therapeutic and appear correlated (aPTT 90, HL 0.58). No bleeding or issues noted - will d/c aPTT checks moving forward and continue with heparin levels only.   Goal of Therapy:  Heparin level 0.3-0.7 units/ml aPTT 66 - 102 seconds Monitor platelets by anticoagulation protocol: Yes   Plan:  - Continue heparin infusion at 2300 units/hr - D/c aPTT checks - Will continue to monitor for any signs/symptoms of bleeding and will follow up with heparin level in the a.m.   Thank you for allowing pharmacy to be a part of this patient's care.  Alycia Rossetti, PharmD, BCPS Clinical Pharmacist Clinical phone for 05/10/2019: [phone] 05/10/2019 11:46 AM   **Pharmacist phone directory can now be found on [email or web address] ([email or web address]).  Listed under [place].

## 2019-05-11 ENCOUNTER — Encounter (HOSPITAL_COMMUNITY)
Admission: EM | Disposition: A | Discharge status: Skilled Nursing Facility | Payer: Self-pay | Source: Home / Self Care | Attending: Internal Medicine

## 2019-05-11 ENCOUNTER — Inpatient Hospital Stay (HOSPITAL_COMMUNITY): Payer: Medicare Other

## 2019-05-11 DIAGNOSIS — S32012A Unstable burst fracture of first lumbar vertebra, initial encounter for closed fracture: Secondary | ICD-10-CM | POA: Diagnosis not present

## 2019-05-11 DIAGNOSIS — J9611 Chronic respiratory failure with hypoxia: Secondary | ICD-10-CM | POA: Diagnosis not present

## 2019-05-11 DIAGNOSIS — I5022 Chronic systolic (congestive) heart failure: Secondary | ICD-10-CM | POA: Diagnosis not present

## 2019-05-11 DIAGNOSIS — I482 Chronic atrial fibrillation, unspecified: Secondary | ICD-10-CM | POA: Diagnosis not present

## 2019-05-11 LAB — BLOOD CULTURE ID PANEL (REFLEXED)

## 2019-05-11 LAB — COMPREHENSIVE METABOLIC PANEL
ALT: 17 U/L (ref 0–44)
AST: 22 U/L (ref 15–41)
Albumin: 2.3 g/dL — ABNORMAL LOW (ref 3.5–5.0)
Alkaline Phosphatase: 64 U/L (ref 38–126)
Anion gap: 13 (ref 5–15)
BUN: 47 mg/dL — ABNORMAL HIGH (ref 8–23)
CO2: 26 mmol/L (ref 22–32)
Calcium: 8.3 mg/dL — ABNORMAL LOW (ref 8.9–10.3)
Chloride: 97 mmol/L — ABNORMAL LOW (ref 98–111)
Creatinine, Ser: 1.63 mg/dL — ABNORMAL HIGH (ref 0.61–1.24)
GFR calc Af Amer: 49 mL/min — ABNORMAL LOW (ref >60)
GFR calc non Af Amer: 43 mL/min — ABNORMAL LOW (ref >60)
Glucose, Bld: 171 mg/dL — ABNORMAL HIGH (ref 70–99)
Potassium: 4.2 mmol/L (ref 3.5–5.1)
Sodium: 136 mmol/L (ref 135–145)
Total Bilirubin: 1.9 mg/dL — ABNORMAL HIGH (ref 0.3–1.2)
Total Protein: 5.6 g/dL — ABNORMAL LOW (ref 6.5–8.1)

## 2019-05-11 LAB — CBC WITH DIFFERENTIAL/PLATELET
Abs Immature Granulocytes: 0.55 10*3/uL — ABNORMAL HIGH (ref 0.00–0.07)
Basophils Absolute: 0 10*3/uL (ref 0.0–0.1)
Basophils Relative: 0 %
Eosinophils Absolute: 0 10*3/uL (ref 0.0–0.5)
Eosinophils Relative: 0 %
HCT: 39.4 % (ref 39.0–52.0)
Hemoglobin: 12.3 g/dL — ABNORMAL LOW (ref 13.0–17.0)
Immature Granulocytes: 3 %
Lymphocytes Relative: 1 %
Lymphs Abs: 0.1 10*3/uL — ABNORMAL LOW (ref 0.7–4.0)
MCH: 27.2 pg (ref 26.0–34.0)
MCHC: 31.2 g/dL (ref 30.0–36.0)
MCV: 87 fL (ref 80.0–100.0)
Monocytes Absolute: 1 10*3/uL (ref 0.1–1.0)
Monocytes Relative: 5 %
Neutro Abs: 19.1 10*3/uL — ABNORMAL HIGH (ref 1.7–7.7)
Neutrophils Relative %: 91 %
Platelets: 127 10*3/uL — ABNORMAL LOW (ref 150–400)
RBC: 4.53 MIL/uL (ref 4.22–5.81)
RDW: 15.6 % — ABNORMAL HIGH (ref 11.5–15.5)
WBC: 20.9 10*3/uL — ABNORMAL HIGH (ref 4.0–10.5)
nRBC: 0.1 % (ref 0.0–0.2)

## 2019-05-11 LAB — HEPARIN LEVEL (UNFRACTIONATED)
Heparin Unfractionated: 0.85 IU/mL — ABNORMAL HIGH (ref 0.30–0.70)
Heparin Unfractionated: 0.78 IU/mL — ABNORMAL HIGH (ref 0.30–0.70)
Heparin Unfractionated: 0.64 IU/mL (ref 0.30–0.70)

## 2019-05-11 LAB — APTT: aPTT: 96 seconds — ABNORMAL HIGH (ref 24–36)

## 2019-05-11 LAB — URINE CULTURE: Culture: NO GROWTH

## 2019-05-11 LAB — GLUCOSE, CAPILLARY
Glucose-Capillary: 203 mg/dL — ABNORMAL HIGH (ref 70–99)
Glucose-Capillary: 268 mg/dL — ABNORMAL HIGH (ref 70–99)

## 2019-05-11 LAB — C-REACTIVE PROTEIN: CRP: 25.1 mg/dL — ABNORMAL HIGH (ref <1.0)

## 2019-05-11 LAB — FERRITIN: Ferritin: 333 ng/mL (ref 24–336)

## 2019-05-11 LAB — D-DIMER, QUANTITATIVE: D-Dimer, Quant: 4.11 ug/mL-FEU — ABNORMAL HIGH (ref 0.00–0.50)

## 2019-05-11 LAB — PROCALCITONIN: Procalcitonin: 31.39 ng/mL

## 2019-05-11 SURGERY — POSTERIOR LUMBAR FUSION 4 LEVEL
Anesthesia: General

## 2019-05-11 MED ORDER — MAGNESIUM SULFATE IN D5W 1-5 GM/100ML-% IV SOLN
1.0000 g | Freq: Once | INTRAVENOUS | Status: AC
  Administered 2019-05-11: 1 g via INTRAVENOUS
  Filled 2019-05-11: qty 100

## 2019-05-11 MED ORDER — SODIUM CHLORIDE 0.9 % IV SOLN
2.0000 g | INTRAVENOUS | Status: DC
  Administered 2019-05-11 – 2019-05-12 (×2): 2 g via INTRAVENOUS
  Filled 2019-05-11 (×2): qty 2

## 2019-05-11 NOTE — Progress Notes (Signed)
ANTICOAGULATION CONSULT NOTE - Follow Up Consult  Pharmacy Consult for heparin Indication: atrial fibrillation   Labs: Recent Labs    05/09/19 0521 05/09/19 1225 05/09/19 1948 05/10/19 0406 05/11/19 0332 05/11/19 1300  HGB 12.2*  --   --  12.2* 12.3*  --   HCT 39.3  --   --  37.8* 39.4  --   PLT 107*  --   --  111* 127*  --   APTT 62* 74* 78* 90*  --   --   HEPARINUNFRC 0.74*  --   --  0.58 0.85* 0.78*  CREATININE 1.20  --   --  1.09 1.63*  --     Assessment: 73 YOM presented s/p fall with lumbar fracture.  Found to be positive for Covid-19 with d-dimer > 20. Patient was on Xarelto PTA for history of Afib, which to remain on hold for possible surgery (patient last took Xarelto on 05/05/19 AM).  Pharmacy has been consulted to dose heparin.  Heparin level this afternoon remains elevated despite a rate decrease earlier today. The RN also reports that the drip has "stalled out" a few times today when the patient was not receiving any drug. Will decrease again and recheck a heparin level and aPTT this evening to ensure still correlating.   Goal of Therapy:  Heparin level 0.3-0.7 units/ml aPTT 66 - 102 seconds Monitor platelets by anticoagulation protocol: Yes   Plan:  - Decrease Heparin drip to 2000 units/hr (20 ml/hr) - Will continue to monitor for any signs/symptoms of bleeding and will follow up with heparin level and aPTT in 6 hours   Thank you for allowing pharmacy to be a part of this patient's care.  Alycia Rossetti, PharmD, BCPS Clinical Pharmacist Clinical phone for 05/11/2019: [phone] 05/11/2019 2:17 PM   **Pharmacist phone directory can now be found on [email or web address] ([email or web address]).  Listed under [place].

## 2019-05-11 NOTE — Progress Notes (Signed)
ANTICOAGULATION CONSULT NOTE - Follow Up Consult  Pharmacy Consult for heparin Indication: atrial fibrillation  Labs: Recent Labs    05/09/19 0521 05/09/19 1225 05/09/19 1948 05/10/19 0406 05/11/19 0332  HGB 12.2*  --   --  12.2* 12.3*  HCT 39.3  --   --  37.8* 39.4  PLT 107*  --   --  111* 127*  APTT 62* 74* 78* 90*  --   HEPARINUNFRC 0.74*  --   --  0.58 0.85*  CREATININE 1.20  --   --  1.09 1.63*    Assessment: 29 YOM presented s/p fall with lumbar fracture.  Found to be positive for Covid-19 with d-dimer > 20. Patient was on Xarelto PTA for history of Afib, which to remain on hold for possible surgery (patient last took Xarelto on 05/05/19 AM).  Pharmacy has been consulted to dose heparin. Heparin level this morning 0.85 units/ml  Goal of Therapy:  Heparin level 0.3-0.7 units/ml Monitor platelets by anticoagulation protocol: Yes   Plan:  - Decrease heparin infusion to 2150 units/hr - Check heparin level later today - Will continue to monitor for any signs/symptoms of bleeding and will follow up with heparin level in the a.m.   Thank you for allowing pharmacy to be a part of this patient's care.  Excell Seltzer, PharmD Clinical Pharmacist 05/11/2019 5:12 AM

## 2019-05-11 NOTE — Progress Notes (Signed)
PHARMACY - PHYSICIAN COMMUNICATION CRITICAL VALUE ALERT - BLOOD CULTURE IDENTIFICATION (BCID)  Cody Hale is an 68 y.o. male who presented to [facility] on [facility]/06/2019 with a chief complaint of a fall.  Patient was febrile yesterday and blood cultures were drawn and are positive for streptococcus species, not otherwise identified  Assessment:  Streptococcal bacteremia - patient with history of bioprosthetic mitral valve  Name of physician (or Provider) Contacted: [place]  Current antibiotics: None - on remdesivir for COVID-19  Changes to prescribed antibiotics recommended: Add ceftriaxone 2g IV daily Recommendations accepted by provider  Results for orders placed or performed during the hospital encounter of 05/06/19  Blood Culture ID Panel (Reflexed) (Collected: 05/10/2019  4:26 PM)  Result Value Ref Range   Enterococcus species NOT DETECTED NOT DETECTED   Listeria monocytogenes NOT DETECTED NOT DETECTED   Staphylococcus species NOT DETECTED NOT DETECTED   Staphylococcus aureus (BCID) NOT DETECTED NOT DETECTED   Streptococcus species DETECTED (A) NOT DETECTED   Streptococcus agalactiae NOT DETECTED NOT DETECTED   Streptococcus pneumoniae NOT DETECTED NOT DETECTED   Streptococcus pyogenes NOT DETECTED NOT DETECTED   Acinetobacter baumannii NOT DETECTED NOT DETECTED   Enterobacteriaceae species NOT DETECTED NOT DETECTED   Enterobacter cloacae complex NOT DETECTED NOT DETECTED   Escherichia coli NOT DETECTED NOT DETECTED   Klebsiella oxytoca NOT DETECTED NOT DETECTED   Klebsiella pneumoniae NOT DETECTED NOT DETECTED   Proteus species NOT DETECTED NOT DETECTED   Serratia marcescens NOT DETECTED NOT DETECTED   Haemophilus influenzae NOT DETECTED NOT DETECTED   Neisseria meningitidis NOT DETECTED NOT DETECTED   Pseudomonas aeruginosa NOT DETECTED NOT DETECTED   Candida albicans NOT DETECTED NOT DETECTED   Candida glabrata NOT DETECTED NOT DETECTED   Candida krusei NOT DETECTED NOT  DETECTED   Candida parapsilosis NOT DETECTED NOT DETECTED   Candida tropicalis NOT DETECTED NOT DETECTED    [name] 05/11/2019  7:20 AM

## 2019-05-11 NOTE — Progress Notes (Addendum)
PROGRESS NOTE  Cody Hale PXT:[id number] DOB: October 04, 1950 DOA: 05/06/2019 PCP: [name], MD  HPI/Recap of past 24 hours: HPI from Dr Nils Flack Lindemann is a 68 y.o. 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  Patient fell onto his buttock. Due to significant pain, pt was brought to the ER.  Denies any incontinence of urine or bowel. In the ED, at [facility], CT showed unstable lumbar fracture and patient was transferred to [facility].  Patient had Covid test done which was positive. Patient reports some productive cough. CXR showing vascular congestion, otherwise unremarkable.  Other labs show BNP of 326 LDH 331 CRP 14.7 WBC 11.9 hemoglobin 13.8 platelets 104 creatinine 1.3. Neurosurgery has been consulted. Patient is being admitted for further management of lumbar fracture.    Yesterday evening, patient noted to spike a temp as high as 104.7, today, patient reports feeling okay, denies worsening SOB.  Patient denies any abdominal pain, nausea/vomiting, dysuria, chest pain.   Assessment/Plan: Active Problems:   OSA (obstructive sleep apnea)   Type II diabetes mellitus with manifestations (HCC)   Chronic systolic CHF (congestive heart failure) (HCC)   Hyperlipidemia with target LDL less than 70   Chronic atrial fibrillation   Chronic respiratory failure with hypoxia (HCC)   Unstable burst fracture of unspecified lumbar vertebra, initial encounter for closed fracture (HCC)   Closed unstable burst fracture of lumbar vertebra (HCC)   Closed unstable burst fracture of first lumbar vertebra (HCC)   Pressure injury of skin  Unstable L1-L2 fracture Complains of back pain Neurosurgery on board, postpone surgery for now Cardiology consulted, cleared patient for surgery, signed  off.  Reconsulted cardiology on 05/10/19 for significant PVCs, bigeminy, awaiting further recommendation Spoke to neurosurgery Dr. Conchita Paris on 05/10/2019, who was in agreement with starting patient on heparin drip and to closely monitor for any neuro change (risk of hematoma in pt with spinal fracture), plans to postpone surgery due to bacteremia Neurochecks every 4 hours while on heparin drip Strict bed rest, remain flat Continue to hold Xarelto, pain management Further management per neurosurgery  Bacteremia 2/2 Streptococcus specie Currently afebrile, with marked leukocytosis Procalcitonin elevated to 31.39 CRP elevated BC x2 growing Streptococcus species, repeat pending Spoke to ID Dr. Merceda Elks on 05/11/2019, recommended ceftriaxone pending susceptibility for at least 10 days, no need for TTE/TEE Continue ceftriaxone Monitor very closely  COVID-19 viral infection Currently desaturates while sleeping, has a history of OSA, cannot use CPAP due to Covid infection Afebrile, with leukocytosis CRP 14.7-->19.9--> 21.2  D-dimer greater than 20 on admission (??Hx of ankylosing spondylitis) Patient with high risk of thromboembolic events, continue heparin drip for [facility] Lower extremity Doppler negative for DVT Chest x-ray showing vascular congestion Continue Decadron, completed remdesivir on 05/10/19 Trend inflammatory markers  AKI Cr rose to 1.63 from normal baseline Likely 2/2 bacteremia Vs pt on diuretics Will hold home torsemide for now Will avoid IVF as pt currently has CHF and Covid Daily BMP  Thrombocytopenia ?? from COVID-19 infection Daily CBC  Chronic A. Fib Rate controlled Continue Coreg Hold Xarelto Continue heparin drip  Chronic combined systolic and diastolic HF/history of bioprosthetic MVR/multiple PVCs noted on telemetry Appears compensated BNP 326 Last EF was 40 to 45% in November 2019 Cardiology reconsulted for multiple PVCs/bigeminy Monitor electrolytes  potassium, magnesium, replace as  needed Continue torsemide, Coreg  Diabetes mellitus type 2 Continue SSI, Accu-Cheks, hypoglycemic protocol  Hyperlipidemia Continue statins        Malnutrition Type:      Malnutrition Characteristics:      Nutrition Interventions:       Estimated body mass index is 39.43 kg/m as calculated from the following:   Height as of this encounter: 5\' 9"  (1.753 m).   Weight as of this encounter: 121.1 kg.     Code Status: Full  Family Communication: None at bedside  Disposition Plan: To be determined   Consultants:  Neurosurgery  Cardiology  Procedures:  None  Antimicrobials:  None  DVT prophylaxis: Heparin drip   Objective: Vitals:   05/10/19 2208 05/11/19 0514 05/11/19 0800 05/11/19 1210  BP: 90/60 117/61 119/73 (!) 121/91  Pulse: 88 80 73   Resp: 20 19    Temp: 99.2 F (37.3 C) 98.5 F (36.9 C) 99.3 F (37.4 C) 99.1 F (37.3 C)  TempSrc: Oral Oral Axillary Axillary  SpO2: 95% 95%  94%  Weight:      Height:        Intake/Output Summary (Last 24 hours) at 05/11/2019 1408 Last data filed at 05/11/2019 0747 Gross per 24 hour  Intake 619.42 ml  Output 3300 ml  Net -2680.58 ml   Filed Weights   05/06/19 1457  Weight: 121.1 kg    Exam:  General: NAD   Cardiovascular: S1, S2 present  Respiratory:  Bibasilar crackles noted bilaterally  Abdomen: Soft, nontender, nondistended, bowel sounds present  Musculoskeletal: No bilateral pedal edema noted  Skin: Normal  Psychiatry: Normal mood  Neurology: No obvious focal neurologic deficits noted, able to wiggle toes, unable to lift right lower extremity due to significant back tenderness, 4/5 strength in left lower extremity  Data Reviewed: CBC: Recent Labs  Lab 05/06/19 1552 05/08/19 0500 05/09/19 0521 05/10/19 0406 05/11/19 0332  WBC 11.9* 11.3* 9.9 10.2 20.9*  NEUTROABS 10.7* 10.3* 9.1* 9.5* 19.1*  HGB 13.8 12.4* 12.2* 12.2* 12.3*  HCT 45.5  40.2 39.3 37.8* 39.4  MCV 90.3 90.1 88.7 86.3 87.0  PLT 104* 108* 107* 111* 299*   Basic Metabolic Panel: Recent Labs  Lab 05/06/19 2327 05/08/19 0500 05/09/19 0521 05/09/19 1225 05/10/19 0406 05/11/19 0332  NA 137 137 135  --  135 136  K 3.8 4.8 4.7  --  4.5 4.2  CL 103 101 100  --  98 97*  CO2 25 25 25   --  27 26  GLUCOSE 196* 221* 207*  --  208* 171*  BUN 20 36* 40*  --  40* 47*  CREATININE 1.18 1.33* 1.20  --  1.09 1.63*  CALCIUM 8.3* 8.7* 8.5*  --  8.4* 8.3*  MG  --   --   --  1.9  --   --    GFR: Estimated Creatinine Clearance: 55.8 mL/min (A) (by C-G formula based on SCr of 1.63 mg/dL (H)). Liver Function Tests: Recent Labs  Lab 05/06/19 2327 05/08/19 0500 05/09/19 0521 05/10/19 0406 05/11/19 0332  AST 70* 43* 32 24 22  ALT 22 20 19 18 17   ALKPHOS 64 60 58 61 64  BILITOT 1.1 0.7 0.8 0.9 1.9*  PROT 7.3 6.0* 5.8* 5.6* 5.6*  ALBUMIN 3.4* 2.8* 2.6* 2.5* 2.3*   No results for input(s): LIPASE, AMYLASE in the last 168 hours. No results for input(s): AMMONIA in the last 168 hours. Coagulation Profile: Recent Labs  Lab 05/06/19 1552  INR  1.2   Cardiac Enzymes: No results for input(s): CKTOTAL, CKMB, CKMBINDEX, TROPONINI in the last 168 hours. BNP (last 3 results) No results for input(s): PROBNP in the last 8760 hours. HbA1C: No results for input(s): HGBA1C in the last 72 hours. CBG: Recent Labs  Lab 05/10/19 0826 05/10/19 1248 05/10/19 1536 05/10/19 2106 05/11/19 1003  GLUCAP 226* 228* 196* 95 203*   Lipid Profile: No results for input(s): CHOL, HDL, LDLCALC, TRIG, CHOLHDL, LDLDIRECT in the last 72 hours. Thyroid Function Tests: No results for input(s): TSH, T4TOTAL, FREET4, T3FREE, THYROIDAB in the last 72 hours. Anemia Panel: Recent Labs    05/10/19 0406 05/11/19 0332  FERRITIN 262 333   Urine analysis:    Component Value Date/Time   COLORURINE YELLOW 05/10/2019 1830   APPEARANCEUR HAZY (A) 05/10/2019 1830   APPEARANCEUR Clear  07/23/2017   LABSPEC 1.012 05/10/2019 1830   PHURINE 5.0 05/10/2019 1830   GLUCOSEU NEGATIVE 05/10/2019 1830   GLUCOSEU 500 (A) 07/14/2017 1053   HGBUR MODERATE (A) 05/10/2019 1830   BILIRUBINUR NEGATIVE 05/10/2019 1830   BILIRUBINUR Negative 07/23/2017   KETONESUR NEGATIVE 05/10/2019 1830   PROTEINUR NEGATIVE 05/10/2019 1830   UROBILINOGEN 0.2 07/14/2017 1053   NITRITE NEGATIVE 05/10/2019 1830   LEUKOCYTESUR NEGATIVE 05/10/2019 1830   Sepsis Labs: @LABRCNTIP (procalcitonin:4,lacticidven:4)  ) Recent Results (from the past 240 hour(s))  SARS Coronavirus 2 by RT PCR (hospital order, performed in [facility] hospital lab) Nasopharyngeal Nasopharyngeal Swab     Status: Abnormal   Collection Time: 05/06/19  6:25 PM   Specimen: Nasopharyngeal Swab  Result Value Ref Range Status   SARS Coronavirus 2 POSITIVE (A) NEGATIVE Final    Comment: RESULT CALLED TO, READ BACK BY AND VERIFIED WITH: J APPELT,RN@2021  05/06/19 MKELLY (NOTE) If result is NEGATIVE SARS-CoV-2 target nucleic acids are NOT DETECTED. The SARS-CoV-2 RNA is generally detectable in upper and lower  respiratory specimens during the acute phase of infection. The lowest  concentration of SARS-CoV-2 viral copies this assay can detect is 250  copies / mL. A negative result does not preclude SARS-CoV-2 infection  and should not be used as the sole basis for treatment or other  patient management decisions.  A negative result may occur with  improper specimen collection / handling, submission of specimen other  than nasopharyngeal swab, presence of viral mutation(s) within the  areas targeted by this assay, and inadequate number of viral copies  (<250 copies / mL). A negative result must be combined with clinical  observations, patient history, and epidemiological information. If result is POSITIVE SARS-CoV-2 target nucleic acids are DETECTED. The  SARS-CoV-2 RNA is generally detectable in upper and lower  respiratory  specimens during the acute phase of infection.  Positive  results are indicative of active infection with SARS-CoV-2.  Clinical  correlation with patient history and other diagnostic information is  necessary to determine patient infection status.  Positive results do  not rule out bacterial infection or co-infection with other viruses. If result is PRESUMPTIVE POSTIVE SARS-CoV-2 nucleic acids MAY BE PRESENT.   A presumptive positive result was obtained on the submitted specimen  and confirmed on repeat testing.  While 2019 novel coronavirus  (SARS-CoV-2) nucleic acids may be present in the submitted sample  additional confirmatory testing may be necessary for epidemiological  and / or clinical management purposes  to differentiate between  SARS-CoV-2 and other Sarbecovirus currently known to infect humans.  If clinically indicated additional testing with an alternate test  methodology [phone] is  ad vised. The SARS-CoV-2 RNA is generally  detectable in upper and lower respiratory specimens during the acute  phase of infection. The expected result is Negative. Fact Sheet for Patients:  BoilerBrush.com.cyhttps://www.fda.gov/media/136312/download Fact Sheet for Healthcare Providers: https://pope.com/https://www.fda.gov/media/136313/download This test is not yet approved or cleared by the [place] FDA and has been authorized for detection and/or diagnosis of SARS-CoV-2 by FDA under an Emergency Use Authorization (EUA).  This EUA will remain in effect (meaning this test can be used) for the duration of the COVID-19 declaration under Section 564(b)(1) of the Act, 21 U.S.C. section 360bbb-3(b)(1), unless the authorization is terminated or revoked sooner. Performed at [facility], [address] [place], [place] [address]   Culture, blood (routine x 2)     Status: None (Preliminary result)   Collection Time: 05/10/19  4:26 PM   Specimen: BLOOD LEFT HAND  Result Value Ref Range Status   Specimen Description BLOOD  LEFT HAND  Final   Special Requests   Final    BOTTLES DRAWN AEROBIC ONLY Blood Culture adequate volume   Culture  Setup Time   Final    GRAM POSITIVE COCCI AEROBIC BOTTLE ONLY CRITICAL VALUE NOTED.  VALUE IS CONSISTENT WITH PREVIOUSLY REPORTED AND CALLED VALUE.    Culture   Final    NO GROWTH < 24 HOURS Performed at [facility], [address] [place], [place] [address]    Report Status PENDING  Incomplete  Culture, blood (routine x 2)     Status: None (Preliminary result)   Collection Time: 05/10/19  4:26 PM   Specimen: BLOOD LEFT HAND  Result Value Ref Range Status   Specimen Description BLOOD LEFT HAND  Final   Special Requests   Final    BOTTLES DRAWN AEROBIC ONLY Blood Culture adequate volume   Culture  Setup Time   Final    GRAM POSITIVE COCCI AEROBIC BOTTLE ONLY Organism ID to follow CRITICAL RESULT CALLED TO, READ BACK BY AND VERIFIED WITH: Peter MiniumJ. Frens PharmD 7:15 05/11/19 ([email or web address])    Culture   Final    NO GROWTH < 24 HOURS Performed at [facility], [address] [place], [place] [address]    Report Status PENDING  Incomplete  Blood Culture ID Panel (Reflexed)     Status: Abnormal   Collection Time: 05/10/19  4:26 PM  Result Value Ref Range Status   Enterococcus species NOT DETECTED NOT DETECTED Final   Listeria monocytogenes NOT DETECTED NOT DETECTED Final   Staphylococcus species NOT DETECTED NOT DETECTED Final   Staphylococcus aureus (BCID) NOT DETECTED NOT DETECTED Final   Streptococcus species DETECTED (A) NOT DETECTED Final    Comment: Not Enterococcus species, Streptococcus agalactiae, Streptococcus pyogenes, or Streptococcus pneumoniae. CRITICAL RESULT CALLED TO, READ BACK BY AND VERIFIED WITH: Peter MiniumJ. Frens PharmD 7:15 05/11/19 ([email or web address])    Streptococcus agalactiae NOT DETECTED NOT DETECTED Final   Streptococcus pneumoniae NOT DETECTED NOT DETECTED Final   Streptococcus pyogenes NOT DETECTED NOT DETECTED Final   Acinetobacter baumannii NOT  DETECTED NOT DETECTED Final   Enterobacteriaceae species NOT DETECTED NOT DETECTED Final   Enterobacter cloacae complex NOT DETECTED NOT DETECTED Final   Escherichia coli NOT DETECTED NOT DETECTED Final   Klebsiella oxytoca NOT DETECTED NOT DETECTED Final   Klebsiella pneumoniae NOT DETECTED NOT DETECTED Final   Proteus species NOT DETECTED NOT DETECTED Final   Serratia marcescens NOT DETECTED NOT DETECTED Final   Haemophilus influenzae NOT DETECTED NOT DETECTED Final   Neisseria  meningitidis NOT DETECTED NOT DETECTED Final   Pseudomonas aeruginosa NOT DETECTED NOT DETECTED Final   Candida albicans NOT DETECTED NOT DETECTED Final   Candida glabrata NOT DETECTED NOT DETECTED Final   Candida krusei NOT DETECTED NOT DETECTED Final   Candida parapsilosis NOT DETECTED NOT DETECTED Final   Candida tropicalis NOT DETECTED NOT DETECTED Final    Comment: Performed at [facility], [address] [place], [place] [address]  Culture, Urine     Status: None   Collection Time: 05/10/19  7:13 PM   Specimen: Urine, Catheterized  Result Value Ref Range Status   Specimen Description URINE, CATHETERIZED  Final   Special Requests NONE  Final   Culture   Final    NO GROWTH Performed at [facility], [address], [place], [place] [address]    Report Status 05/11/2019 FINAL  Final      Studies: Ct Lumbar Spine Wo Contrast  Result Date: 05/11/2019 CLINICAL DATA:  Lumbar fracture.  Surgical planning. EXAM: CT LUMBAR SPINE WITHOUT CONTRAST TECHNIQUE: Multidetector CT imaging of the lumbar spine was performed without intravenous contrast administration. Multiplanar CT image reconstructions were also generated. COMPARISON:  05/06/2019 FINDINGS: The study is mildly motion degraded despite repeat imaging. Segmentation: 5 lumbar type vertebrae. Alignment: Trace retrolisthesis of L5 on S1. Vertebrae: Diffuse ankylosis is again noted throughout the lumbar and included lower thoracic spine as well  as across both SI joints. A transverse fracture is again seen through the L1-2 disc space including across the bridging osteophytes/syndesmophytes. Posterior element extension was better demonstrated on the prior study due to motion and image noise on today's examination. Anterior widening of the L1-2 disc space is unchanged. No new fracture is identified. Paraspinal and other soft tissues: Small pleural effusions and dependent atelectasis in the lung bases. Aortic atherosclerosis. Partially visualized right hip arthroplasty. Disc levels: Disc space narrowing greatest at L5-S1 where endplate and facet spurring result in severe bilateral neural foraminal stenosis. IMPRESSION: 1. Lumbar spine CT for surgical planning. 2. Findings suggestive of ankylosing spondylitis with fracture through the L1-2 disc space as previously described. Unchanged anterior disc space widening. 3.  Aortic Atherosclerosis (ICD10-I70.0). Electronically Signed   By: Sebastian Ache M.D.   On: 05/11/2019 14:04   Dg Chest Port 1 View  Result Date: 05/10/2019 CLINICAL DATA:  68 year old male positive for COVID-19. Fever. EXAM: PORTABLE CHEST 1 VIEW COMPARISON:  Portable chest 05/06/2019 and earlier. FINDINGS: Portable AP semi upright view at 1645 hours. Chronic cardiomegaly. Mildly lower lung volumes compared to October. New bilateral basilar predominant confluent and indistinct pulmonary opacity. No superimposed pneumothorax or pleural effusion. Prior sternotomy. No acute osseous abnormality identified. IMPRESSION: 1. New from 05/06/2019 bilateral basilar predominant bilateral pulmonary opacity compatible with COVID-19 pneumonia. 2. Chronic cardiomegaly. Electronically Signed   By: Odessa Fleming M.D.   On: 05/10/2019 17:04    Scheduled Meds: . carvedilol  3.125 mg Oral BID  . Chlorhexidine Gluconate Cloth  6 each Topical Daily  . dexamethasone (DECADRON) injection  6 mg Intravenous Q24H  . fluticasone  2 spray Each Nare Daily  . fluticasone  furoate-vilanterol  1 puff Inhalation Daily  . insulin aspart  0-5 Units Subcutaneous QHS  . insulin aspart  0-9 Units Subcutaneous TID WC  . insulin aspart  3 Units Subcutaneous TID WC  . insulin glargine  32 Units Subcutaneous Daily  . loratadine  10 mg Oral QPM  . mupirocin ointment  1 application Nasal BID  .  pantoprazole  40 mg Oral Daily  . polyethylene glycol  17 g Oral Daily  . potassium chloride SA  20 mEq Oral BID  . senna-docusate  2 tablet Oral BID  . torsemide  20 mg Oral q1800  . torsemide  40 mg Oral Daily  . umeclidinium bromide  1 puff Inhalation Daily  . vitamin C  500 mg Oral Daily  . zinc sulfate  220 mg Oral Daily    Continuous Infusions: . cefTRIAXone (ROCEPHIN)  IV 2 g (05/11/19 0917)  . heparin 2,150 Units/hr (05/11/19 1210)     LOS: 5 days     Briant Cedar, MD [facility]  If 7PM-7AM, please contact night-coverage [email or web address] 05/11/2019, 2:08 PM

## 2019-05-11 NOTE — Progress Notes (Signed)
ANTICOAGULATION CONSULT NOTE - Follow Up Consult  Pharmacy Consult for Heparin Indication: atrial fibrillation  Allergies  Allergen Reactions  . Lisinopril Cough  . Daptomycin Rash  . Metformin And Related Diarrhea  . Tape Rash and Other (See Comments)    Adhesive Tape-Burn skin.    Patient Measurements: Height: 5\' 9"  (175.3 cm) Weight: 267 lb (121.1 kg) IBW/kg (Calculated) : 70.7 Heparin Dosing Weight:   Vital Signs: Temp: 99.6 F (37.6 C) (11/03 1612) Temp Source: Axillary (11/03 1612) BP: 125/63 (11/03 1612)  Labs: Recent Labs    05/09/19 0521  05/09/19 1948 05/10/19 0406 05/11/19 0332 05/11/19 1300 05/11/19 2126  HGB 12.2*  --   --  12.2* 12.3*  --   --   HCT 39.3  --   --  37.8* 39.4  --   --   PLT 107*  --   --  111* 127*  --   --   APTT 62*   < > 78* 90*  --   --  96*  HEPARINUNFRC 0.74*  --   --  0.58 0.85* 0.78* 0.64  CREATININE 1.20  --   --  1.09 1.63*  --   --    < > = values in this interval not displayed.    Estimated Creatinine Clearance: 55.8 mL/min (A) (by C-G formula based on SCr of 1.63 mg/dL (H)).  Assessment:    Anticoag: Xarelto PTA for Afib, LD 10/28 AM, d-dimer >20 >> switch to Heparin. Hgb 12.3 stable. Plts 127 low but improving.  APTT 96 in goal with HL 0.64 in goal.  Goal of Therapy:  Heparin level 0.3-0.7 units/ml Monitor platelets by anticoagulation protocol: Yes   Plan:  Heparin at 2000 units/hr Daily HL and CBC   Garmon Dehn S. Alford Highland, PharmD, BCPS Clinical Staff Pharmacist Eilene Ghazi Stillinger 05/11/2019,10:21 PM

## 2019-05-12 DIAGNOSIS — R7881 Bacteremia: Secondary | ICD-10-CM

## 2019-05-12 DIAGNOSIS — W19XXXA Unspecified fall, initial encounter: Secondary | ICD-10-CM

## 2019-05-12 DIAGNOSIS — B954 Other streptococcus as the cause of diseases classified elsewhere: Secondary | ICD-10-CM

## 2019-05-12 DIAGNOSIS — U071 COVID-19: Secondary | ICD-10-CM | POA: Diagnosis not present

## 2019-05-12 DIAGNOSIS — S32009A Unspecified fracture of unspecified lumbar vertebra, initial encounter for closed fracture: Secondary | ICD-10-CM | POA: Diagnosis not present

## 2019-05-12 DIAGNOSIS — I5022 Chronic systolic (congestive) heart failure: Secondary | ICD-10-CM | POA: Diagnosis not present

## 2019-05-12 DIAGNOSIS — Z952 Presence of prosthetic heart valve: Secondary | ICD-10-CM

## 2019-05-12 DIAGNOSIS — Z91048 Other nonmedicinal substance allergy status: Secondary | ICD-10-CM

## 2019-05-12 DIAGNOSIS — S32012A Unstable burst fracture of first lumbar vertebra, initial encounter for closed fracture: Principal | ICD-10-CM

## 2019-05-12 DIAGNOSIS — Z87891 Personal history of nicotine dependence: Secondary | ICD-10-CM

## 2019-05-12 DIAGNOSIS — J9611 Chronic respiratory failure with hypoxia: Secondary | ICD-10-CM | POA: Diagnosis not present

## 2019-05-12 DIAGNOSIS — Z888 Allergy status to other drugs, medicaments and biological substances status: Secondary | ICD-10-CM

## 2019-05-12 LAB — COMPREHENSIVE METABOLIC PANEL
ALT: 17 U/L (ref 0–44)
AST: 16 U/L (ref 15–41)
Albumin: 2.2 g/dL — ABNORMAL LOW (ref 3.5–5.0)
Alkaline Phosphatase: 69 U/L (ref 38–126)
Anion gap: 10 (ref 5–15)
BUN: 50 mg/dL — ABNORMAL HIGH (ref 8–23)
CO2: 29 mmol/L (ref 22–32)
Calcium: 8.3 mg/dL — ABNORMAL LOW (ref 8.9–10.3)
Chloride: 102 mmol/L (ref 98–111)
Creatinine, Ser: 1.22 mg/dL (ref 0.61–1.24)
GFR calc Af Amer: >60 mL/min (ref >60)
GFR calc non Af Amer: >60 mL/min (ref >60)
Glucose, Bld: 298 mg/dL — ABNORMAL HIGH (ref 70–99)
Potassium: 4.5 mmol/L (ref 3.5–5.1)
Sodium: 141 mmol/L (ref 135–145)
Total Bilirubin: 1.2 mg/dL (ref 0.3–1.2)
Total Protein: 5.7 g/dL — ABNORMAL LOW (ref 6.5–8.1)

## 2019-05-12 LAB — CBC WITH DIFFERENTIAL/PLATELET
Abs Immature Granulocytes: 0.08 10*3/uL — ABNORMAL HIGH (ref 0.00–0.07)
Basophils Absolute: 0 10*3/uL (ref 0.0–0.1)
Basophils Relative: 0 %
Eosinophils Absolute: 0 10*3/uL (ref 0.0–0.5)
Eosinophils Relative: 0 %
HCT: 39.4 % (ref 39.0–52.0)
Hemoglobin: 12.5 g/dL — ABNORMAL LOW (ref 13.0–17.0)
Immature Granulocytes: 1 %
Lymphocytes Relative: 3 %
Lymphs Abs: 0.3 10*3/uL — ABNORMAL LOW (ref 0.7–4.0)
MCH: 27.7 pg (ref 26.0–34.0)
MCHC: 31.7 g/dL (ref 30.0–36.0)
MCV: 87.2 fL (ref 80.0–100.0)
Monocytes Absolute: 0.4 10*3/uL (ref 0.1–1.0)
Monocytes Relative: 4 %
Neutro Abs: 10.4 10*3/uL — ABNORMAL HIGH (ref 1.7–7.7)
Neutrophils Relative %: 92 %
Platelets: 119 10*3/uL — ABNORMAL LOW (ref 150–400)
RBC: 4.52 MIL/uL (ref 4.22–5.81)
RDW: 15.8 % — ABNORMAL HIGH (ref 11.5–15.5)
WBC: 11.3 10*3/uL — ABNORMAL HIGH (ref 4.0–10.5)
nRBC: 0 % (ref 0.0–0.2)

## 2019-05-12 LAB — GLUCOSE, CAPILLARY
Glucose-Capillary: 291 mg/dL — ABNORMAL HIGH (ref 70–99)
Glucose-Capillary: 272 mg/dL — ABNORMAL HIGH (ref 70–99)
Glucose-Capillary: 291 mg/dL — ABNORMAL HIGH (ref 70–99)
Glucose-Capillary: 236 mg/dL — ABNORMAL HIGH (ref 70–99)

## 2019-05-12 LAB — D-DIMER, QUANTITATIVE: D-Dimer, Quant: 3.09 ug/mL-FEU — ABNORMAL HIGH (ref 0.00–0.50)

## 2019-05-12 LAB — PROCALCITONIN: Procalcitonin: 16.18 ng/mL

## 2019-05-12 LAB — C-REACTIVE PROTEIN: CRP: 26 mg/dL — ABNORMAL HIGH (ref <1.0)

## 2019-05-12 LAB — FERRITIN: Ferritin: 337 ng/mL — ABNORMAL HIGH (ref 24–336)

## 2019-05-12 LAB — HEPARIN LEVEL (UNFRACTIONATED): Heparin Unfractionated: 0.52 IU/mL (ref 0.30–0.70)

## 2019-05-12 MED ORDER — ADULT MULTIVITAMIN W/MINERALS CH
1.0000 | ORAL_TABLET | Freq: Every day | ORAL | Status: DC
  Administered 2019-05-12 – 2019-05-20 (×8): 1 via ORAL
  Filled 2019-05-12 (×9): qty 1

## 2019-05-12 MED ORDER — ENSURE MAX PROTEIN PO LIQD
11.0000 [oz_av] | Freq: Two times a day (BID) | ORAL | Status: DC
  Administered 2019-05-12: 237 mL via ORAL
  Administered 2019-05-13 – 2019-05-19 (×10): 11 [oz_av] via ORAL
  Filled 2019-05-12 (×20): qty 330

## 2019-05-12 MED ORDER — PENICILLIN G POTASSIUM 20000000 UNITS IJ SOLR
12.0000 10*6.[IU] | Freq: Two times a day (BID) | INTRAVENOUS | Status: DC
  Administered 2019-05-13 – 2019-05-15 (×5): 12 10*6.[IU] via INTRAVENOUS
  Filled 2019-05-12 (×6): qty 12

## 2019-05-12 NOTE — Progress Notes (Signed)
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 Patient fell onto his buttock. Due to significant pain, pt was brought to the ER. Denies any incontinence of urine or bowel. In the ED, at [facility], CT showed unstable lumbar fracture and patient was transferred to [facility]. Patient had Covid test done which was positive. Patient reports some productive cough. CXR showing vascular congestion, otherwise unremarkable.  Other labs show BNP of 326 LDH 331 CRP 14.7 WBC 11.9 hemoglobin 13.8 platelets 104 creatinine 1.3. Neurosurgery has been consulted. Patient is being admitted for further management of lumbar fracture.   Assessment & Plan:   Active Problems:   OSA (obstructive sleep apnea)   Type II diabetes mellitus with manifestations (HCC)   Chronic systolic CHF (congestive heart failure) (HCC)   Hyperlipidemia with target LDL less than 70   Chronic atrial fibrillation   Chronic respiratory failure with hypoxia (HCC)   Unstable burst fracture of unspecified lumbar vertebra, initial encounter for closed fracture (HCC)   Closed unstable burst fracture of lumbar vertebra (HCC)   Closed unstable burst fracture of first lumbar vertebra ([place])   Pressure injury of skin   Unstable L1-L2 fracture -Neurosurgery on board, surgery currently on hold given infectious w/u -Cardiology consulted, cleared patient for surgery, signed off.   -Reconsulted cardiology on 05/10/19 for significant PVCs, bigeminy -Dr. Horris Latino spoke to neurosurgery Dr. Kathyrn Sheriff on 05/10/2019, who was in agreement with starting patient on heparin drip and to closely monitor for any neuro change  (risk of hematoma in pt with spinal fracture), plans to postpone surgery due to bacteremia -Neurochecks every 4 hours while on heparin drip -Discussed with Neurosurgery, recommendation for strict bed rest, remain flat -Further management per neurosurgery  Bacteremia 2/2 GBS -Remains afebrile -Procalcitonin elevated to 31.39 -CRP elevated -Blood cx pos for GBS. In setting of prosthetic mitral valve, recommendation for f/u TEE, anticipated for end of week -Duration of abx to be determined pending TEE result -For now, ID recommends changing to penicillin starting 11/5. Discussed case with Dr. Linus Salmons  COVID-19 viral infection -Currently desaturates while sleeping, has a history of OSA, cannot use CPAP due to Covid infection -Afebrile, with leukocytosis -CRP remains elevated -D-dimer greater than 20 on admission (??Hx of ankylosing spondylitis) -Patient with high risk of thromboembolic events, continue heparin drip for [facility] -Lower extremity Doppler negative for DVT -Chest x-ray showing vascular congestion -Continue Decadron, completed remdesivir on 05/10/19 -Continue to trend inflammatory markers  AKI -Cr peaked to 1.63 from normal baseline -Likely 2/2 bacteremia vs pt on diuretics -Holding torsemide for now -Will avoid IVF as pt currently has CHF and Covid -Repeat bmet in AM  Thrombocytopenia -?? from COVID-19 infection -Repeat CBC in AM  Chronic A. Fib -Remains rate controlled -Continue Coreg -Holding Xarelto and continue on heparin gtt instead  Chronic combined systolic and diastolic HF/history of bioprosthetic MVR/multiple PVCs noted on telemetry -Appears compensated -BNP 326 -Last EF was 40 to 45% in November 2019 -Cardiology reportedly reconsulted for multiple PVCs/bigeminy -Monitor electrolytes potassium, magnesium, replace as needed -Continue torsemide, Coreg  Diabetes mellitus type 2 -Continue SSI, Accu-Cheks, hypoglycemic protocol  Hyperlipidemia -Continue  statin as tolerated  DVT prophylaxis: heparin gtt Code Status: Full  Family Communication: Pt in room, family not at bedside Disposition Plan: Uncertain at this time  Consultants:   Neurosurgery  ID  Procedures:     Antimicrobials: Anti-infectives (From admission, onward)   Start     Dose/Rate Route Frequency Ordered Stop   05/13/19 1000  penicillin G potassium 12 Million Units in dextrose 5 % 500 mL continuous infusion     12 Million Units 41.7 mL/hr over 12 Hours Intravenous Every 12 hours 05/12/19 1159     05/11/19 0730  cefTRIAXone (ROCEPHIN) 2 g in sodium chloride 0.9 % 100 mL IVPB  Status:  Discontinued     2 g 200 mL/hr over 30 Minutes Intravenous Every 24 hours 05/11/19 0721 05/12/19 1159   05/08/19 0200  remdesivir 100 mg in sodium chloride 0.9 % 250 mL IVPB     100 mg 500 mL/hr over 30 Minutes Intravenous Every 24 hours 05/07/19 0059 05/11/19 0513   05/07/19 0100  remdesivir 200 mg in sodium chloride 0.9 % 250 mL IVPB     200 mg 500 mL/hr over 30 Minutes Intravenous Once 05/07/19 0057 05/07/19 0512       Subjective: Without complaints this AM  Objective: Vitals:   05/12/19 1125 05/12/19 1524 05/12/19 1526 05/12/19 1529  BP: 118/73 120/67  112/61  Pulse: 74 (!) 57  69  Resp: 20 16  16   Temp: (!) 96.8 F (36 C)  (!) 96 F (35.6 C)   TempSrc: Axillary  Axillary   SpO2: 99% 100%  99%  Weight:      Height:        Intake/Output Summary (Last 24 hours) at 05/12/2019 1904 Last data filed at 05/12/2019 1500 Gross per 24 hour  Intake 4542.53 ml  Output 2525 ml  Net 2017.53 ml   Filed Weights   05/06/19 1457  Weight: 121.1 kg    Examination:  General exam: Appears calm and comfortable  Respiratory system: Clear to auscultation. Respiratory effort normal. Cardiovascular system: S1 & S2 heard, RRR Gastrointestinal system: Abdomen is nondistended, soft and nontender. No organomegaly or masses felt. Normal bowel sounds heard. Central nervous system:  Alert and oriented. No focal neurological deficits. Extremities: Symmetric 5 x 5 power. Skin: No rashes, lesions Psychiatry: Judgement and insight appear normal. Mood & affect appropriate.   Data Reviewed: I have personally reviewed following labs and imaging studies  CBC: Recent Labs  Lab 05/08/19 0500 05/09/19 0521 05/10/19 0406 05/11/19 0332 05/12/19 0344  WBC 11.3* 9.9 10.2 20.9* 11.3*  NEUTROABS 10.3* 9.1* 9.5* 19.1* 10.4*  HGB 12.4* 12.2* 12.2* 12.3* 12.5*  HCT 40.2 39.3 37.8* 39.4 39.4  MCV 90.1 88.7 86.3 87.0 87.2  PLT 108* 107* 111* 127* 119*   Basic Metabolic Panel: Recent Labs  Lab 05/08/19 0500 05/09/19 0521 05/09/19 1225 05/10/19 0406 05/11/19 0332 05/12/19 0344  NA 137 135  --  135 136 141  K 4.8 4.7  --  4.5 4.2 4.5  CL 101 100  --  98 97* 102  CO2 25 25  --  27 26 29   GLUCOSE 221* 207*  --  208* 171* 298*  BUN 36* 40*  --  40* 47* 50*  CREATININE 1.33* 1.20  --  1.09 1.63* 1.22  CALCIUM 8.7* 8.5*  --  8.4* 8.3* 8.3*  MG  --   --  1.9  --   --   --    GFR: Estimated Creatinine Clearance: 74.5 mL/min (by C-G formula based on SCr of 1.22 mg/dL).  Liver Function Tests: Recent Labs  Lab 05/08/19 0500 05/09/19 0521 05/10/19 0406 05/11/19 0332 05/12/19 0344  AST 43* 32 24 22 16   ALT 20 19 18 17 17   ALKPHOS 60 58 61 64 69  BILITOT 0.7 0.8 0.9 1.9* 1.2  PROT 6.0* 5.8* 5.6* 5.6* 5.7*  ALBUMIN 2.8* 2.6* 2.5* 2.3* 2.2*   No results for input(s): LIPASE, AMYLASE in the last 168 hours. No results for input(s): AMMONIA in the last 168 hours. Coagulation Profile: Recent Labs  Lab 05/06/19 1552  INR 1.2   Cardiac Enzymes: No results for input(s): CKTOTAL, CKMB, CKMBINDEX, TROPONINI in the last 168 hours. BNP (last 3 results) No results for input(s): PROBNP in the last 8760 hours. HbA1C: No results for input(s): HGBA1C in the last 72 hours. CBG: Recent Labs  Lab 05/11/19 1003 05/11/19 2012 05/12/19 0710 05/12/19 1122 05/12/19 1511  GLUCAP  203* 268* 291* 272* 291*   Lipid Profile: No results for input(s): CHOL, HDL, LDLCALC, TRIG, CHOLHDL, LDLDIRECT in the last 72 hours. Thyroid Function Tests: No results for input(s): TSH, T4TOTAL, FREET4, T3FREE, THYROIDAB in the last 72 hours. Anemia Panel: Recent Labs    05/11/19 0332 05/12/19 0344  FERRITIN 333 337*   Sepsis Labs: Recent Labs  Lab 05/09/19 0521 05/10/19 0406 05/10/19 1626 05/10/19 1911 05/11/19 0332 05/12/19 0344  PROCALCITON 0.27 0.31  --   --  31.39 16.18  LATICACIDVEN  --   --  2.3* 3.5*  --   --     Recent Results (from the past 240 hour(s))  SARS Coronavirus 2 by RT PCR (hospital order, performed in [facility] hospital lab) Nasopharyngeal Nasopharyngeal Swab     Status: Abnormal   Collection Time: 05/06/19  6:25 PM   Specimen: Nasopharyngeal Swab  Result Value Ref Range Status   SARS Coronavirus 2 POSITIVE (A) NEGATIVE Final    Comment: RESULT CALLED TO, READ BACK BY AND VERIFIED WITH: J APPELT,RN@2021  05/06/19 MKELLY (NOTE) If result is NEGATIVE SARS-CoV-2 target nucleic acids are NOT DETECTED. The SARS-CoV-2 RNA is generally detectable in upper and lower  respiratory specimens during the acute phase of infection. The lowest  concentration of SARS-CoV-2 viral copies this assay can detect is 250  copies / mL. A negative result does not preclude SARS-CoV-2 infection  and should not be used as the sole basis for treatment or other  patient management decisions.  A negative result may occur with  improper specimen collection / handling, submission of specimen other  than nasopharyngeal swab, presence of viral mutation(s) within the  areas targeted by this assay, and inadequate number of viral copies  (<250 copies / mL). A negative result must be combined with clinical  observations, patient history, and epidemiological information. If result is POSITIVE SARS-CoV-2 target nucleic acids are DETECTED. The  SARS-CoV-2 RNA is generally detectable  in upper and lower  respiratory specimens during the acute phase of infection.  Positive  results are indicative of active infection with SARS-CoV-2.  Clinical  correlation with patient history and other diagnostic information is  necessary to determine patient infection status.  Positive results do  not rule out bacterial infection or co-infection with other viruses. If result is PRESUMPTIVE POSTIVE SARS-CoV-2 nucleic acids MAY BE PRESENT.   A presumptive positive result was obtained on the submitted specimen  and confirmed on repeat testing.  While 2019 novel coronavirus  (SARS-CoV-2) nucleic acids may be present in the submitted sample  additional confirmatory testing may be necessary for epidemiological  and / or clinical management purposes  to differentiate between  SARS-CoV-2 and other Sarbecovirus currently known to infect humans.  If clinically indicated additional testing with an alternate test  methodology [phone]) is ad vised. The SARS-CoV-2 RNA is generally  detectable in upper and lower respiratory specimens during the acute  phase of infection. The expected result is Negative. Fact Sheet for Patients:  BoilerBrush.com.cyhttps://www.fda.gov/media/136312/download Fact Sheet for Healthcare Providers: https://pope.com/https://www.fda.gov/media/136313/download This test is not yet approved or cleared by the [place] FDA and has been authorized for detection and/or diagnosis of SARS-CoV-2 by FDA under an Emergency Use Authorization (EUA).  This EUA will remain in effect (meaning this test can be used) for the duration of the COVID-19 declaration under Section 564(b)(1) of the Act, 21 U.S.C. section 360bbb-3(b)(1), unless the authorization is terminated or revoked sooner. Performed at [facility], [address] [place], [place] [address]   Culture, blood (routine x 2)     Status: Abnormal (Preliminary result)   Collection Time: 05/10/19  4:26 PM   Specimen: BLOOD LEFT HAND  Result Value Ref Range  Status   Specimen Description BLOOD LEFT HAND  Final   Special Requests   Final    BOTTLES DRAWN AEROBIC ONLY Blood Culture adequate volume   Culture  Setup Time   Final    GRAM POSITIVE COCCI AEROBIC BOTTLE ONLY CRITICAL VALUE NOTED.  VALUE IS CONSISTENT WITH PREVIOUSLY REPORTED AND CALLED VALUE. Performed at [facility], [address] [place], [place] [address]    Culture STREPTOCOCCUS GROUP G (A)  Final   Report Status PENDING  Incomplete  Culture, blood (routine x 2)     Status: Abnormal (Preliminary result)   Collection Time: 05/10/19  4:26 PM   Specimen: BLOOD LEFT HAND  Result Value Ref Range Status   Specimen Description BLOOD LEFT HAND  Final   Special Requests   Final    BOTTLES DRAWN AEROBIC ONLY Blood Culture adequate volume   Culture  Setup Time   Final    GRAM POSITIVE COCCI AEROBIC BOTTLE ONLY CRITICAL RESULT CALLED TO, READ BACK BY AND VERIFIED WITH: Peter MiniumJ. Frens PharmD 7:15 05/11/19 ([email or web address]) Performed at [facility], [address] [place], [place] [address]    Culture STREPTOCOCCUS GROUP G (A)  Final   Report Status PENDING  Incomplete  Blood Culture ID Panel (Reflexed)     Status: Abnormal   Collection Time: 05/10/19  4:26 PM  Result Value Ref Range Status   Enterococcus species NOT DETECTED NOT DETECTED Final   Listeria monocytogenes NOT DETECTED NOT DETECTED Final   Staphylococcus species NOT DETECTED NOT DETECTED Final   Staphylococcus aureus (BCID) NOT DETECTED NOT DETECTED Final   Streptococcus species DETECTED (A) NOT DETECTED Final    Comment: Not Enterococcus species, Streptococcus agalactiae, Streptococcus pyogenes, or Streptococcus pneumoniae. CRITICAL RESULT CALLED TO, READ BACK BY AND VERIFIED WITH: Peter MiniumJ. Frens PharmD 7:15 05/11/19 ([email or web address])    Streptococcus agalactiae NOT DETECTED NOT DETECTED Final   Streptococcus pneumoniae NOT DETECTED NOT DETECTED Final   Streptococcus pyogenes NOT DETECTED NOT DETECTED Final   Acinetobacter  baumannii NOT DETECTED NOT DETECTED Final   Enterobacteriaceae species NOT DETECTED NOT DETECTED Final   Enterobacter cloacae complex NOT DETECTED NOT DETECTED Final   Escherichia coli NOT DETECTED NOT DETECTED Final   Klebsiella oxytoca NOT DETECTED NOT DETECTED Final   Klebsiella pneumoniae NOT DETECTED NOT DETECTED Final   Proteus species NOT DETECTED NOT DETECTED Final   Serratia  marcescens NOT DETECTED NOT DETECTED Final   Haemophilus influenzae NOT DETECTED NOT DETECTED Final   Neisseria meningitidis NOT DETECTED NOT DETECTED Final   Pseudomonas aeruginosa NOT DETECTED NOT DETECTED Final   Candida albicans NOT DETECTED NOT DETECTED Final   Candida glabrata NOT DETECTED NOT DETECTED Final   Candida krusei NOT DETECTED NOT DETECTED Final   Candida parapsilosis NOT DETECTED NOT DETECTED Final   Candida tropicalis NOT DETECTED NOT DETECTED Final    Comment: Performed at [facility], [address], [place], [place] [address]  Culture, Urine     Status: None   Collection Time: 05/10/19  7:13 PM   Specimen: Urine, Catheterized  Result Value Ref Range Status   Specimen Description URINE, CATHETERIZED  Final   Special Requests NONE  Final   Culture   Final    NO GROWTH Performed at [facility], [address] [place], [place] [address]    Report Status 05/11/2019 FINAL  Final  Culture, blood (routine x 2)     Status: None (Preliminary result)   Collection Time: 05/11/19 12:35 PM   Specimen: BLOOD  Result Value Ref Range Status   Specimen Description BLOOD RIGHT ANTECUBITAL  Final   Special Requests   Final    BOTTLES DRAWN AEROBIC ONLY Blood Culture results may not be optimal due to an inadequate volume of blood received in culture bottles   Culture  Setup Time   Final    GRAM POSITIVE COCCI AEROBIC BOTTLE ONLY CRITICAL RESULT CALLED TO, READ BACK BY AND VERIFIED WITHGeorgia Duff PHARMD, AT 1443 05/12/19 BY Renato Shin Performed at [facility], [address] [place], [place] [address]    Culture GRAM POSITIVE COCCI  Final   Report Status PENDING  Incomplete  Culture, blood (routine x 2)     Status: None (Preliminary result)   Collection Time: 05/11/19 12:45 PM   Specimen: BLOOD RIGHT HAND  Result Value Ref Range Status   Specimen Description BLOOD RIGHT HAND  Final   Special Requests   Final    BOTTLES DRAWN AEROBIC ONLY Blood Culture results may not be optimal due to an inadequate volume of blood received in culture bottles   Culture   Final    NO GROWTH 1 DAY Performed at [facility], [address] [place], [place] [address]    Report Status PENDING  Incomplete     Radiology Studies: Ct Lumbar Spine Wo Contrast  Result Date: 05/11/2019 CLINICAL DATA:  Lumbar fracture.  Surgical planning. EXAM: CT LUMBAR SPINE WITHOUT CONTRAST TECHNIQUE: Multidetector CT imaging of the lumbar spine was performed without intravenous contrast administration. Multiplanar CT image reconstructions were also generated. COMPARISON:  05/06/2019 FINDINGS: The study is mildly motion degraded despite repeat imaging. Segmentation: 5 lumbar type vertebrae. Alignment: Trace retrolisthesis of L5 on S1. Vertebrae: Diffuse ankylosis is again noted throughout the lumbar and included lower thoracic spine as well as across both SI joints. A transverse fracture is again seen through the L1-2 disc space including across the bridging osteophytes/syndesmophytes. Posterior element extension was better demonstrated on the prior study due to motion and image noise on today's examination. Anterior widening of the L1-2 disc space is unchanged. No new fracture is identified. Paraspinal and other soft tissues: Small pleural effusions and dependent atelectasis in the lung bases. Aortic atherosclerosis. Partially visualized right hip arthroplasty. Disc levels: Disc space narrowing greatest at L5-S1 where endplate and facet spurring result in  severe bilateral neural foraminal stenosis.  IMPRESSION: 1. Lumbar spine CT for surgical planning. 2. Findings suggestive of ankylosing spondylitis with fracture through the L1-2 disc space as previously described. Unchanged anterior disc space widening. 3.  Aortic Atherosclerosis (ICD10-I70.0). Electronically Signed   By: Sebastian Ache M.D.   On: 05/11/2019 14:04    Scheduled Meds:  carvedilol  3.125 mg Oral BID   Chlorhexidine Gluconate Cloth  6 each Topical Daily   dexamethasone (DECADRON) injection  6 mg Intravenous Q24H   fluticasone  2 spray Each Nare Daily   fluticasone furoate-vilanterol  1 puff Inhalation Daily   insulin aspart  0-5 Units Subcutaneous QHS   insulin aspart  0-9 Units Subcutaneous TID WC   insulin aspart  3 Units Subcutaneous TID WC   insulin glargine  32 Units Subcutaneous Daily   loratadine  10 mg Oral QPM   multivitamin with minerals  1 tablet Oral Daily   mupirocin ointment  1 application Nasal BID   pantoprazole  40 mg Oral Daily   polyethylene glycol  17 g Oral Daily   potassium chloride SA  20 mEq Oral BID   Ensure Max Protein  11 oz Oral BID   senna-docusate  2 tablet Oral BID   umeclidinium bromide  1 puff Inhalation Daily   vitamin C  500 mg Oral Daily   zinc sulfate  220 mg Oral Daily   Continuous Infusions:  heparin 2,000 Units/hr (05/12/19 1346)   [START ON 05/13/2019] penicillin g continuous IV infusion       LOS: 6 days   Rickey Barbara, MD [facility] Pager On Amion  If 7PM-7AM, please contact night-coverage 05/12/2019, 7:04 PM

## 2019-05-12 NOTE — Progress Notes (Addendum)
Daily Nursing Note  Received report from [place], Therapist, sports. Patient remains on bedrest. Patient seems intermittently more confused today. Patient has a poor appetite though hydrates well with prompting. ID saw patient given his bacteremia --> plan to start PCN G tomorrow as had already received CTX today. Plan will be for TEE later this week with cardiology. Remains on heparin gtt at 2000units/hr. Ensure ordered by nutrition though patient does not like it. Neurosurgery planning to intervene after bacteremia work up is completed.   All patient needs met during the course of the shift.

## 2019-05-12 NOTE — Consult Note (Signed)
[facility]       Reason for Consult: bacteremia    Referring Physician: Dr. Rhona Leavens  Active Problems:   OSA (obstructive sleep apnea)   Type II diabetes mellitus with manifestations (HCC)   Chronic systolic CHF (congestive heart failure) (HCC)   Hyperlipidemia with target LDL less than 70   Chronic atrial fibrillation   Chronic respiratory failure with hypoxia (HCC)   Unstable burst fracture of unspecified lumbar vertebra, initial encounter for closed fracture (HCC)   Closed unstable burst fracture of lumbar vertebra (HCC)   Closed unstable burst fracture of first lumbar vertebra (HCC)   Pressure injury of skin   . carvedilol  3.125 mg Oral BID  . Chlorhexidine Gluconate Cloth  6 each Topical Daily  . dexamethasone (DECADRON) injection  6 mg Intravenous Q24H  . fluticasone  2 spray Each Nare Daily  . fluticasone furoate-vilanterol  1 puff Inhalation Daily  . insulin aspart  0-5 Units Subcutaneous QHS  . insulin aspart  0-9 Units Subcutaneous TID WC  . insulin aspart  3 Units Subcutaneous TID WC  . insulin glargine  32 Units Subcutaneous Daily  . loratadine  10 mg Oral QPM  . mupirocin ointment  1 application Nasal BID  . pantoprazole  40 mg Oral Daily  . polyethylene glycol  17 g Oral Daily  . potassium chloride SA  20 mEq Oral BID  . senna-docusate  2 tablet Oral BID  . umeclidinium bromide  1 puff Inhalation Daily  . vitamin C  500 mg Oral Daily  . zinc sulfate  220 mg Oral Daily    Recommendations: Will change to penicillin which will be via pump twice a day (starting tomorrow) TEE (have spoken to cardiology, tentatively Friday)  Repeat blood cultures (sent yesterday)  Assessment: He has bacteremia with Group G Strep and a history of a mitral valve replacement with bioprosthetic valve.  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 I would hold off on neurosurgical intervention for duration of antibiotics, if possible.  Duration  will depend on TEE results.    Antibiotics: ceftriaxone  HPI: Cody Hale is a 68 y.o. male with CHF, history of bioprosthetic valve replacement following endocarditis in 2014 by Dr. Nevada Crane at [facility].  [facility] in to [facility] after a fall at home and found a lumbar fracture.  Testing for COVID was positive but no acute symptoms.  He is planned to get surgical treatment of his fracture but developed a fever up to 104 and blood cultures positive as above.  He did complete 5 days of Remdesivir and has been on heparin drip with high D-dimer.  Also received decadron.  He continues to have back pan.    Review of Systems:  Constitutional: negative for fevers, chills and anorexia Respiratory: negative for cough or sputum Gastrointestinal: negative for nausea and diarrhea All other systems reviewed and are negative    Past Medical History:  Diagnosis Date  . A-fib (HCC)   . Anxiety   . Atrial fibrillation (HCC)   . Cellulitis   . CHF (congestive heart failure) (HCC)   . Chronic venous insufficiency   . COPD (chronic obstructive pulmonary disease) (HCC)   . Coronary atherosclerosis of native coronary artery    Mild nonobstructive 08/2012  . Degenerative joint disease   . Diabetes mellitus, type II (HCC)    Gastroparesis; GI care at [facility]  . 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 s/p zyvox rx.  (  Cultures never positive); left bundle branch block; H/o SVT; 09/2012: bioprosthetic MVR at [facility]; a. Severe dental caries and cavities s/p multiple extractions.  . Essential hypertension, benign   . Gastroparesis   . 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 Left bundle branch block   . Left leg cellulitis   . Major depressive disorder, recurrent severe without psychotic features (HCC)    [facility] admission 12/2012  . Morbid obesity (HCC) 06/27/2012  . Nephrolithiasis   . PSVT (paroxysmal supraventricular tachycardia) (HCC)    Post-op at [facility]  . Sleep apnea    a.  uses CPAP nightly  . Urinary tract infection 08/30/2012   Proteus mirabilis    Social History   Tobacco Use  . Smoking status: Former Smoker    Packs/day: 1.00    Years: 20.00    Pack years: 20.00    Types: Cigarettes    Quit date: 07/08/1992    Years since quitting: 26.8  . Smokeless tobacco: Never Used  . Tobacco comment: smoked about 1.5ppd x 15 yrs, quit 15 yrs ago.  Substance Use Topics  . Alcohol use: No    Alcohol/week: 0.0 standard drinks  . Drug use: No    Family History  Problem Relation Age of Onset  . Lung cancer Father        died @ 50  . Alcohol abuse Father   . Heart disease Father   . Diabetes Father   . Arthritis Father   . Ovarian cancer Mother        died @ 54  . Hypertension Sister   . Hypertension Sister   . Hypertension Sister   . Hypertension Brother   . Early death Neg Hx   . Hyperlipidemia Neg Hx   . Kidney disease Neg Hx   . Stroke Neg Hx   . Colon cancer Neg Hx   . Colon polyps Neg Hx     Allergies  Allergen Reactions  . Lisinopril Cough  . Daptomycin Rash  . Metformin And Related Diarrhea  . Tape Rash and Other (See Comments)    Adhesive Tape-Burn skin.    Physical Exam: Constitutional: in no apparent distress  Vitals:   05/12/19 0715 05/12/19 1125  BP: 124/78 118/73  Pulse: 75 74  Resp: 16 20  Temp: (!) 96.8 F (36 C)   SpO2: 99% 99%   EYES: anicteric Cardiovascular: Cor RRR Respiratory: CTA B; normal respiratory effort GI: soft Musculoskeletal: no edema Skin: no rash Neuro: non-focal; arousable but somnolent  Lab Results  Component Value Date   WBC 11.3 (H) 05/12/2019   HGB 12.5 (L) 05/12/2019   HCT 39.4 05/12/2019   MCV 87.2 05/12/2019   PLT 119 (L) 05/12/2019    Lab Results  Component Value Date   CREATININE 1.22 05/12/2019   BUN 50 (H) 05/12/2019   NA 141 05/12/2019   K 4.5 05/12/2019   CL 102 05/12/2019   CO2 29 05/12/2019    Lab Results  Component Value Date   ALT 17 05/12/2019   AST 16  05/12/2019   ALKPHOS 69 05/12/2019     Microbiology: Recent Results (from the past 240 hour(s))  SARS Coronavirus 2 by RT PCR (hospital order, performed in [facility] hospital lab) Nasopharyngeal Nasopharyngeal Swab     Status: Abnormal   Collection Time: 05/06/19  6:25 PM   Specimen: Nasopharyngeal Swab  Result Value Ref Range Status   SARS Coronavirus 2 POSITIVE (A)  NEGATIVE Final    Comment: RESULT CALLED TO, READ BACK BY AND VERIFIED WITH: J APPELT,RN@2021  05/06/19 MKELLY (NOTE) If result is NEGATIVE SARS-CoV-2 target nucleic acids are NOT DETECTED. The SARS-CoV-2 RNA is generally detectable in upper and lower  respiratory specimens during the acute phase of infection. The lowest  concentration of SARS-CoV-2 viral copies this assay can detect is 250  copies / mL. A negative result does not preclude SARS-CoV-2 infection  and should not be used as the sole basis for treatment or other  patient management decisions.  A negative result may occur with  improper specimen collection / handling, submission of specimen other  than nasopharyngeal swab, presence of viral mutation(s) within the  areas targeted by this assay, and inadequate number of viral copies  (<250 copies / mL). A negative result must be combined with clinical  observations, patient history, and epidemiological information. If result is POSITIVE SARS-CoV-2 target nucleic acids are DETECTED. The  SARS-CoV-2 RNA is generally detectable in upper and lower  respiratory specimens during the acute phase of infection.  Positive  results are indicative of active infection with SARS-CoV-2.  Clinical  correlation with patient history and other diagnostic information is  necessary to determine patient infection status.  Positive results do  not rule out bacterial infection or co-infection with other viruses. If result is PRESUMPTIVE POSTIVE SARS-CoV-2 nucleic acids MAY BE PRESENT.   A presumptive positive result was obtained  on the submitted specimen  and confirmed on repeat testing.  While 2019 novel coronavirus  (SARS-CoV-2) nucleic acids may be present in the submitted sample  additional confirmatory testing may be necessary for epidemiological  and / or clinical management purposes  to differentiate between  SARS-CoV-2 and other Sarbecovirus currently known to infect humans.  If clinically indicated additional testing with an alternate test  methodology [phone]([id number]) is ad vised. The SARS-CoV-2 RNA is generally  detectable in upper and lower respiratory specimens during the acute  phase of infection. The expected result is Negative. Fact Sheet for Patients:  BoilerBrush.com.cyhttps://www.fda.gov/media/136312/download Fact Sheet for Healthcare Providers: https://pope.com/https://www.fda.gov/media/136313/download This test is not yet approved or cleared by the [place] FDA and has been authorized for detection and/or diagnosis of SARS-CoV-2 by FDA under an Emergency Use Authorization (EUA).  This EUA will remain in effect (meaning this test can be used) for the duration of the COVID-19 declaration under Section 564(b)(1) of the Act, 21 U.S.C. section 360bbb-3(b)(1), unless the authorization is terminated or revoked sooner. Performed at [facility], [address] [place], [place] [address]   Culture, blood (routine x 2)     Status: Abnormal (Preliminary result)   Collection Time: 05/10/19  4:26 PM   Specimen: BLOOD LEFT HAND  Result Value Ref Range Status   Specimen Description BLOOD LEFT HAND  Final   Special Requests   Final    BOTTLES DRAWN AEROBIC ONLY Blood Culture adequate volume   Culture  Setup Time   Final    GRAM POSITIVE COCCI AEROBIC BOTTLE ONLY CRITICAL VALUE NOTED.  VALUE IS CONSISTENT WITH PREVIOUSLY REPORTED AND CALLED VALUE. Performed at [facility], [address] [place], [place] [address]    Culture STREPTOCOCCUS GROUP G (A)  Final   Report Status PENDING  Incomplete  Culture, blood (routine x 2)      Status: Abnormal (Preliminary result)   Collection Time: 05/10/19  4:26 PM   Specimen: BLOOD LEFT HAND  Result Value Ref Range Status   Specimen Description BLOOD  LEFT HAND  Final   Special Requests   Final    BOTTLES DRAWN AEROBIC ONLY Blood Culture adequate volume   Culture  Setup Time   Final    GRAM POSITIVE COCCI AEROBIC BOTTLE ONLY CRITICAL RESULT CALLED TO, READ BACK BY AND VERIFIED WITH: Andres Shad PharmD 7:15 05/11/19 ([email or web address]) Performed at [facility], [address] [place], [place] [address]    Culture STREPTOCOCCUS GROUP G (A)  Final   Report Status PENDING  Incomplete  Blood Culture ID Panel (Reflexed)     Status: Abnormal   Collection Time: 05/10/19  4:26 PM  Result Value Ref Range Status   Enterococcus species NOT DETECTED NOT DETECTED Final   Listeria monocytogenes NOT DETECTED NOT DETECTED Final   Staphylococcus species NOT DETECTED NOT DETECTED Final   Staphylococcus aureus (BCID) NOT DETECTED NOT DETECTED Final   Streptococcus species DETECTED (A) NOT DETECTED Final    Comment: Not Enterococcus species, Streptococcus agalactiae, Streptococcus pyogenes, or Streptococcus pneumoniae. CRITICAL RESULT CALLED TO, READ BACK BY AND VERIFIED WITH: Andres Shad PharmD 7:15 05/11/19 ([email or web address])    Streptococcus agalactiae NOT DETECTED NOT DETECTED Final   Streptococcus pneumoniae NOT DETECTED NOT DETECTED Final   Streptococcus pyogenes NOT DETECTED NOT DETECTED Final   Acinetobacter baumannii NOT DETECTED NOT DETECTED Final   Enterobacteriaceae species NOT DETECTED NOT DETECTED Final   Enterobacter cloacae complex NOT DETECTED NOT DETECTED Final   Escherichia coli NOT DETECTED NOT DETECTED Final   Klebsiella oxytoca NOT DETECTED NOT DETECTED Final   Klebsiella pneumoniae NOT DETECTED NOT DETECTED Final   Proteus species NOT DETECTED NOT DETECTED Final   Serratia marcescens NOT DETECTED NOT DETECTED Final   Haemophilus influenzae NOT DETECTED NOT DETECTED Final    Neisseria meningitidis NOT DETECTED NOT DETECTED Final   Pseudomonas aeruginosa NOT DETECTED NOT DETECTED Final   Candida albicans NOT DETECTED NOT DETECTED Final   Candida glabrata NOT DETECTED NOT DETECTED Final   Candida krusei NOT DETECTED NOT DETECTED Final   Candida parapsilosis NOT DETECTED NOT DETECTED Final   Candida tropicalis NOT DETECTED NOT DETECTED Final    Comment: Performed at [facility], [place] [place], [place] [address]  Culture, Urine     Status: None   Collection Time: 05/10/19  7:13 PM   Specimen: Urine, Catheterized  Result Value Ref Range Status   Specimen Description URINE, CATHETERIZED  Final   Special Requests NONE  Final   Culture   Final    NO GROWTH Performed at [facility], [address] [place], [place] [address]    Report Status 05/11/2019 FINAL  Final    Thayer Headings, [place] [facility] [email or web address] 05/12/2019, 11:55 AM

## 2019-05-12 NOTE — Progress Notes (Signed)
Initial Nutrition Assessment  DOCUMENTATION CODES:   Obesity unspecified  INTERVENTION:   -Ensure MAX Protein po BID, each supplement provides 150 kcal and 30 grams of protein -Multivitamin with minerals daily  NUTRITION DIAGNOSIS:   Increased nutrient needs related to wound healing, acute illness as evidenced by estimated needs.  GOAL:   Patient will meet greater than or equal to 90% of their needs  MONITOR:   PO intake, Supplement acceptance, Labs, Weight trends, I & O's, Skin  REASON FOR ASSESSMENT:   (Wound, COVID-19 diagnosis)    ASSESSMENT:   68 y.o. 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  Patient fell onto his buttock.Patient is being admitted for further management of lumbar fracture.  10/29: admitted, COVID-19 positive  **RD working remotely**  Patient pending surgery for lumbar fracture but has been held d/t bacteremia. Pt dose not report COVID-19 symptoms that affect PO intake. No PO documented other than fluids. Pt would likely benefit from protein supplements given increased needs from lumbar fracture, COVID-19 infection and right buttocks wound. Will order Ensure Max.  Per weight records, weight has increased since February 2020.  I/Os: -8.7L since admit UOP: 850 ml so far today  Medications: KLOR-CON, Vitamin C tablet, Zinc sulfate capsule Labs reviewed: CBGs: 272-291   NUTRITION - FOCUSED PHYSICAL EXAM:  Unable to perform -working remotely.  Diet Order:   Diet Order            Diet heart healthy/carb modified Room service appropriate? Yes; Fluid consistency: Thin  Diet effective now              EDUCATION NEEDS:   No education needs have been identified at this time  Skin:  Skin Assessment: Skin Integrity Issues: Skin Integrity Issues:: Stage  II Stage II: right buttocks  Last BM:  PTA  Height:   Ht Readings from Last 1 Encounters:  05/06/19 5\' 9"  (1.753 m)    Weight:   Wt Readings from Last 1 Encounters:  05/06/19 121.1 kg    Ideal Body Weight:  72.7 kg  BMI:  Body mass index is 39.43 kg/m.  Estimated Nutritional Needs:   Kcal:  2100-2300  Protein:  100-110g  Fluid:  2.1L/day  [name] Bibles, MS, RD, LDN Inpatient Clinical Dietitian Pager: [phone] After Hours Pager: [phone]

## 2019-05-12 NOTE — Progress Notes (Signed)
Patient spinal injury awaiting surgery: bedrest. Limited mobility. Foley to remain.

## 2019-05-12 NOTE — Progress Notes (Signed)
Inpatient Diabetes Program Recommendations  AACE/ADA: New Consensus Statement on Inpatient Glycemic Control (2015)  Target Ranges:  Prepandial:   less than 140 mg/dL      Peak postprandial:   less than 180 mg/dL (1-2 hours)      Critically ill patients:  140 - 180 mg/dL   Lab Results  Component Value Date   GLUCAP 272 (H) 05/12/2019   HGBA1C 7.4 (H) 03/24/2019    Review of Glycemic Control  Results for [name], [name] "Cody Hale" (MRN [id number]) as of 05/12/2019 14:32  Ref. Range 05/11/2019 10:03 05/11/2019 20:12 05/12/2019 07:10 05/12/2019 11:22  Glucose-Capillary Latest Ref Range: 70 - 99 mg/dL 203 (H) 268 (H) 291 (H) 272 (H)   Diabetes history: DM2 Outpatient Diabetes medications: Tresiba 50 units daily, Novolog 5 units TID with meals, Farxiga 10 mg daily, Trulicity 1.5 mg Qweek Current orders for Inpatient glycemic control:   Lantus 32 units daily, Novolog 3 units tid meal coverage, Novolog 0-9 units TID with meals  Inpatient Diabetes Program Recommendations:   -Increase Lantus to 36 units daily -Increase Novolog meal coverage to 5 units tid if eats 50%  Thank you, Bethena Roys E. Raynelle Fujikawa, RN, MSN, CDE  Diabetes Coordinator Inpatient Glycemic Control Team Team Pager [phone] (8am-5pm) 05/12/2019 2:33 PM

## 2019-05-12 NOTE — Progress Notes (Signed)
[place] for heparin Indication: atrial fibrillation  Labs: Recent Labs    05/09/19 1948  05/10/19 0406 05/11/19 0332 05/11/19 1300 05/11/19 2126 05/12/19 0344 05/12/19 0345  HGB  --    < > 12.2* 12.3*  --   --  12.5*  --   HCT  --   --  37.8* 39.4  --   --  39.4  --   PLT  --   --  111* 127*  --   --  119*  --   APTT 78*  --  90*  --   --  96*  --   --   HEPARINUNFRC  --    < > 0.58 0.85* 0.78* 0.64  --  0.52  CREATININE  --   --  1.09 1.63*  --   --  1.22  --    < > = values in this interval not displayed.    Assessment: 68 year old male presented s/p fall with lumbar fracture.  Found to be positive for Covid-19 with d-dimer > 20. Patient was on Xarelto PTA for history of Afib, remains on hold, patient last took Xarelto on 05/05/19 AM. Pharmacy has been consulted to dose heparin during peri-operative period. Planning surgical repair of fracture when infection is under better control.  Heparin level is therapeutic this morning. H/h ok, plts 119.  Goal of Therapy:  Heparin level 0.3-0.7 units/ml Monitor platelets by anticoagulation protocol: Yes   Plan:  - Continue heparin infusion at 2000 units/hr - Daily HL, CBC - Monitor surgery plans   Harvel Quale 05/12/2019 9:27 AM

## 2019-05-13 DIAGNOSIS — J9611 Chronic respiratory failure with hypoxia: Secondary | ICD-10-CM | POA: Diagnosis not present

## 2019-05-13 DIAGNOSIS — S32009A Unspecified fracture of unspecified lumbar vertebra, initial encounter for closed fracture: Secondary | ICD-10-CM | POA: Diagnosis not present

## 2019-05-13 LAB — GLUCOSE, CAPILLARY
Glucose-Capillary: 301 mg/dL — ABNORMAL HIGH (ref 70–99)
Glucose-Capillary: 312 mg/dL — ABNORMAL HIGH (ref 70–99)
Glucose-Capillary: 195 mg/dL — ABNORMAL HIGH (ref 70–99)
Glucose-Capillary: 158 mg/dL — ABNORMAL HIGH (ref 70–99)

## 2019-05-13 LAB — CBC
HCT: 41.6 % (ref 39.0–52.0)
Hemoglobin: 12.9 g/dL — ABNORMAL LOW (ref 13.0–17.0)
MCH: 27.4 pg (ref 26.0–34.0)
MCHC: 31 g/dL (ref 30.0–36.0)
MCV: 88.3 fL (ref 80.0–100.0)
Platelets: 105 10*3/uL — ABNORMAL LOW (ref 150–400)
RBC: 4.71 MIL/uL (ref 4.22–5.81)
RDW: 15.5 % (ref 11.5–15.5)
WBC: 9.9 10*3/uL (ref 4.0–10.5)
nRBC: 0.2 % (ref 0.0–0.2)

## 2019-05-13 LAB — CULTURE, BLOOD (ROUTINE X 2)
Special Requests: ADEQUATE
Special Requests: ADEQUATE

## 2019-05-13 LAB — HEPARIN LEVEL (UNFRACTIONATED): Heparin Unfractionated: 0.39 IU/mL (ref 0.30–0.70)

## 2019-05-13 MED ORDER — HYDROMORPHONE HCL 1 MG/ML IJ SOLN
0.5000 mg | INTRAMUSCULAR | Status: DC | PRN
  Administered 2019-05-13 – 2019-05-19 (×9): 0.5 mg via INTRAVENOUS
  Filled 2019-05-13 (×9): qty 1

## 2019-05-13 MED ORDER — LORAZEPAM 2 MG/ML IJ SOLN
0.5000 mg | Freq: Once | INTRAMUSCULAR | Status: AC
  Administered 2019-05-13: 0.5 mg via INTRAVENOUS
  Filled 2019-05-13: qty 1

## 2019-05-13 MED ORDER — INSULIN ASPART 100 UNIT/ML ~~LOC~~ SOLN
5.0000 [IU] | Freq: Three times a day (TID) | SUBCUTANEOUS | Status: DC
  Administered 2019-05-14 – 2019-05-15 (×4): 5 [IU] via SUBCUTANEOUS

## 2019-05-13 MED ORDER — INSULIN GLARGINE 100 UNIT/ML ~~LOC~~ SOLN
38.0000 [IU] | Freq: Every day | SUBCUTANEOUS | Status: DC
  Administered 2019-05-14 – 2019-05-15 (×2): 38 [IU] via SUBCUTANEOUS
  Filled 2019-05-13 (×2): qty 0.38

## 2019-05-13 NOTE — Progress Notes (Signed)
[place] for heparin Indication: atrial fibrillation  Labs: Recent Labs    05/11/19 0332  05/11/19 2126 05/12/19 0344 05/12/19 0345 05/13/19 0402  HGB 12.3*  --   --  12.5*  --  12.9*  HCT 39.4  --   --  39.4  --  41.6  PLT 127*  --   --  119*  --  105*  APTT  --   --  96*  --   --   --   HEPARINUNFRC 0.85*   < > 0.64  --  0.52 0.39  CREATININE 1.63*  --   --  1.22  --   --    < > = values in this interval not displayed.    Assessment: 68 year old male presented s/p fall with lumbar fracture.  Found to be positive for Covid-19 with d-dimer > 20. Patient was on Xarelto PTA for history of Afib, remains on hold, patient last took Xarelto on 05/05/19 AM. Pharmacy has been consulted to dose heparin during peri-operative period. Planning surgical repair of fracture when infection is under better control.  Heparin level is therapeutic this morning. H/h ok, plts 105.  Goal of Therapy:  Heparin level 0.3-0.7 units/ml Monitor platelets by anticoagulation protocol: Yes   Plan:  - Continue heparin infusion at 2000 units/hr - Daily HL, CBC - Monitor surgery plans - Monitor thrombocytopenia   Harvel Quale 05/13/2019 10:53 AM

## 2019-05-13 NOTE — Progress Notes (Signed)
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 Patient fell onto his buttock. Due to significant pain, pt was brought to the ER. Denies any incontinence of urine or bowel. In the ED, at [facility], CT showed unstable lumbar fracture and patient was [facility] to [facility]. Patient had Covid test done which was positive. Patient reports some productive cough. CXR showing vascular congestion, otherwise unremarkable.  Other labs show BNP of 326 LDH 331 CRP 14.7 WBC 11.9 hemoglobin 13.8 platelets 104 creatinine 1.3. Neurosurgery has been consulted. Patient is being admitted for further management of lumbar fracture.   Assessment & Plan:   Active Problems:   OSA (obstructive sleep apnea)   Type II diabetes mellitus with manifestations (HCC)   Chronic systolic CHF (congestive heart failure) (HCC)   Hyperlipidemia with target LDL less than 70   Chronic atrial fibrillation   Chronic respiratory failure with hypoxia (HCC)   Unstable burst fracture of unspecified lumbar vertebra, initial encounter for closed fracture (HCC)   Closed unstable burst fracture of lumbar vertebra (HCC)   Closed unstable burst fracture of first lumbar vertebra (HCC)   Pressure injury of skin  Unstable L1-L2 fracture -Neurosurgery on board, surgery currently on hold given infectious w/u -Cardiology was consulted earlier, cleared patient for surgery, signed off.   -Reconsulted cardiology on 05/10/19 for significant PVCs, bigeminy -Dr. Sharolyn DouglasEzenduka spoke to neurosurgery Dr. Conchita ParisNundkumar on 05/10/2019, who was in agreement with starting patient on heparin drip and to closely monitor for any neuro  change (risk of hematoma in pt with spinal fracture), plans to postpone surgery due to bacteremia -Neurochecks every 4 hours while on heparin drip -Discussed with Neurosurgery, recommendation for strict bed rest, remain flat -Neurosurgery anticipating surgery perhaps next week  Bacteremia 2/2 GBS -Remains afebrile at this time -Procalcitonin elevated to 31.39 -CRP elevated -Blood cx pos for GBS. In setting of prosthetic mitral valve, recommendation for f/u TEE, anticipated for end of week -Duration of abx to be determined pending TEE result -Pt is continued on penicillin per ID recs. Awaiting TEE results  COVID-19 viral infection -Currently desaturates while sleeping, has a history of OSA, cannot use CPAP due to Covid infection -Afebrile, with leukocytosis -CRP remains elevated, stable -D-dimer greater than 20 on admission (??Hx of ankylosing spondylitis) -Patient with high risk of thromboembolic events, continue heparin drip for [facility] -Lower extremity Doppler negative for DVT -Chest x-ray showing vascular congestion -Continue Decadron, completed remdesivir on 05/10/19 -Continue to trend inflammatory markers  AKI -Cr peaked to 1.63 from normal baseline -Likely 2/2 bacteremia vs pt on diuretics -Holding torsemide for now -Will avoid IVF as pt currently has CHF and Covid -recheck bmet in AM  Thrombocytopenia -?? from COVID-19 infection -Remains stable  Chronic A. Fib -Remains rate controlled -Continue Coreg -Continuing to hold Xarelto and continue on heparin gtt instead   Chronic combined systolic and diastolic HF/history of bioprosthetic MVR/multiple PVCs noted on telemetry -Appears compensated -BNP 326 -Last EF was 40 to 45% in November 2019 -Cardiology reportedly reconsulted for multiple PVCs/bigeminy -Monitor electrolytes potassium, magnesium, replace as needed -Continued on torsemide, Coreg  Diabetes mellitus type 2 -Continue SSI, Accu-Cheks, hypoglycemic  protocol  Hyperlipidemia -Continue statin as tolerated  DVT  prophylaxis: heparin gtt Code Status: Full Family Communication: Pt in room, family not at bedside Disposition Plan: Uncertain at this time  Consultants:   Neurosurgery  ID  Procedures:     Antimicrobials: Anti-infectives (From admission, onward)   Start     Dose/Rate Route Frequency Ordered Stop   05/13/19 1000  penicillin G potassium 12 Million Units in dextrose 5 % 500 mL continuous infusion     12 Million Units 41.7 mL/hr over 12 Hours Intravenous Every 12 hours 05/12/19 1159     05/11/19 0730  cefTRIAXone (ROCEPHIN) 2 g in sodium chloride 0.9 % 100 mL IVPB  Status:  Discontinued     2 g 200 mL/hr over 30 Minutes Intravenous Every 24 hours 05/11/19 0721 05/12/19 1159   05/08/19 0200  remdesivir 100 mg in sodium chloride 0.9 % 250 mL IVPB     100 mg 500 mL/hr over 30 Minutes Intravenous Every 24 hours 05/07/19 0059 05/11/19 0513   05/07/19 0100  remdesivir 200 mg in sodium chloride 0.9 % 250 mL IVPB     200 mg 500 mL/hr over 30 Minutes Intravenous Once 05/07/19 0057 05/07/19 0512      Subjective: No complaints this AM  Objective: Vitals:   05/12/19 1938 05/12/19 2104 05/13/19 0423 05/13/19 0839  BP: 119/62  120/64 (!) 118/51  Pulse: (!) 55 (!) 53 66 76  Resp: 15     Temp: (!) 97.3 F (36.3 C)  97.6 F (36.4 C) 97.8 F (36.6 C)  TempSrc: Oral  Axillary   SpO2: 100%   100%  Weight:      Height:        Intake/Output Summary (Last 24 hours) at 05/13/2019 1625 Last data filed at 05/13/2019 1300 Gross per 24 hour  Intake 500 ml  Output 2175 ml  Net -1675 ml   Filed Weights   05/06/19 1457  Weight: 121.1 kg    Examination: General exam: Awake, laying in bed, in nad Respiratory system: Normal respiratory effort, no audible wheezing Cardiovascular system: regular rate, s1, s2 Gastrointestinal system: Soft, nondistended, positive BS Central nervous system: CN2-12 grossly intact, strength  intact Extremities: Perfused, no clubbing Skin: Normal skin turgor, no notable skin lesions seen Psychiatry: Mood normal // no visual hallucinations   Data Reviewed: I have personally reviewed following labs and imaging studies  CBC: Recent Labs  Lab 05/08/19 0500 05/09/19 0521 05/10/19 0406 05/11/19 0332 05/12/19 0344 05/13/19 0402  WBC 11.3* 9.9 10.2 20.9* 11.3* 9.9  NEUTROABS 10.3* 9.1* 9.5* 19.1* 10.4*  --   HGB 12.4* 12.2* 12.2* 12.3* 12.5* 12.9*  HCT 40.2 39.3 37.8* 39.4 39.4 41.6  MCV 90.1 88.7 86.3 87.0 87.2 88.3  PLT 108* 107* 111* 127* 119* 361*   Basic Metabolic Panel: Recent Labs  Lab 05/08/19 0500 05/09/19 0521 05/09/19 1225 05/10/19 0406 05/11/19 0332 05/12/19 0344  NA 137 135  --  135 136 141  K 4.8 4.7  --  4.5 4.2 4.5  CL 101 100  --  98 97* 102  CO2 25 25  --  27 26 29   GLUCOSE 221* 207*  --  208* 171* 298*  BUN 36* 40*  --  40* 47* 50*  CREATININE 1.33* 1.20  --  1.09 1.63* 1.22  CALCIUM 8.7* 8.5*  --  8.4* 8.3* 8.3*  MG  --   --  1.9  --   --   --    GFR: Estimated Creatinine Clearance: 74.5 mL/min (by C-G formula based  on SCr of 1.22 mg/dL). Liver Function Tests: Recent Labs  Lab 05/08/19 0500 05/09/19 0521 05/10/19 0406 05/11/19 0332 05/12/19 0344  AST 43* 32 24 22 16   ALT 20 19 18 17 17   ALKPHOS 60 58 61 64 69  BILITOT 0.7 0.8 0.9 1.9* 1.2  PROT 6.0* 5.8* 5.6* 5.6* 5.7*  ALBUMIN 2.8* 2.6* 2.5* 2.3* 2.2*   No results for input(s): LIPASE, AMYLASE in the last 168 hours. No results for input(s): AMMONIA in the last 168 hours. Coagulation Profile: No results for input(s): INR, PROTIME in the last 168 hours. Cardiac Enzymes: No results for input(s): CKTOTAL, CKMB, CKMBINDEX, TROPONINI in the last 168 hours. BNP (last 3 results) No results for input(s): PROBNP in the last 8760 hours. HbA1C: No results for input(s): HGBA1C in the last 72 hours. CBG: Recent Labs  Lab 05/12/19 1122 05/12/19 1511 05/12/19 2244 05/13/19 0833  05/13/19 1130  GLUCAP 272* 291* 236* 301* 312*   Lipid Profile: No results for input(s): CHOL, HDL, LDLCALC, TRIG, CHOLHDL, LDLDIRECT in the last 72 hours. Thyroid Function Tests: No results for input(s): TSH, T4TOTAL, FREET4, T3FREE, THYROIDAB in the last 72 hours. Anemia Panel: Recent Labs    05/11/19 0332 05/12/19 0344  FERRITIN 333 337*   Sepsis Labs: Recent Labs  Lab 05/09/19 0521 05/10/19 0406 05/10/19 1626 05/10/19 1911 05/11/19 0332 05/12/19 0344  PROCALCITON 0.27 0.31  --   --  31.39 16.18  LATICACIDVEN  --   --  2.3* 3.5*  --   --     Recent Results (from the past 240 hour(s))  SARS Coronavirus 2 by RT PCR (hospital order, performed in [facility] lab) Nasopharyngeal Nasopharyngeal Swab     Status: Abnormal   Collection Time: 05/06/19  6:25 PM   Specimen: Nasopharyngeal Swab  Result Value Ref Range Status   SARS Coronavirus 2 POSITIVE (A) NEGATIVE Final    Comment: RESULT CALLED TO, READ BACK BY AND VERIFIED WITH: J APPELT,RN@2021  05/06/19 MKELLY (NOTE) If result is NEGATIVE SARS-CoV-2 target nucleic acids are NOT DETECTED. The SARS-CoV-2 RNA is generally detectable in upper and lower  respiratory specimens during the acute phase of infection. The lowest  concentration of SARS-CoV-2 viral copies this assay can detect is 250  copies / mL. A negative result does not preclude SARS-CoV-2 infection  and should not be used as the sole basis for treatment or other  patient management decisions.  A negative result may occur with  improper specimen collection / handling, submission of specimen other  than nasopharyngeal swab, presence of viral mutation(s) within the  areas targeted by this assay, and inadequate number of viral copies  (<250 copies / mL). A negative result must be combined with clinical  observations, patient history, and epidemiological information. If result is POSITIVE SARS-CoV-2 target nucleic acids are DETECTED. The  SARS-CoV-2 RNA  is generally detectable in upper and lower  respiratory specimens during the acute phase of infection.  Positive  results are indicative of active infection with SARS-CoV-2.  Clinical  correlation with patient history and other diagnostic information is  necessary to determine patient infection status.  Positive results do  not rule out bacterial infection or co-infection with other viruses. If result is PRESUMPTIVE POSTIVE SARS-CoV-2 nucleic acids MAY BE PRESENT.   A presumptive positive result was obtained on the submitted specimen  and confirmed on repeat testing.  While 2019 novel coronavirus  (SARS-CoV-2) nucleic acids may be present in the submitted sample  additional confirmatory testing  may be necessary for epidemiological  and / or clinical management purposes  to differentiate between  SARS-CoV-2 and other Sarbecovirus currently known to infect humans.  If clinically indicated additional testing with an alternate test  methodology [id number]) is ad vised. The SARS-CoV-2 RNA is generally  detectable in upper and lower respiratory specimens during the acute  phase of infection. The expected result is Negative. Fact Sheet for Patients:  BoilerBrush.com.cy Fact Sheet for Healthcare Providers: https://pope.com/ This test is not yet approved or cleared by the [place] FDA and has been authorized for detection and/or diagnosis of SARS-CoV-2 by FDA under an Emergency Use Authorization (EUA).  This EUA will remain in effect (meaning this test can be used) for the duration of the COVID-19 declaration under Section 564(b)(1) of the Act, 21 U.S.C. section 360bbb-3(b)(1), unless the authorization is terminated or revoked sooner. Performed at [facility], [address], [place], [place] [address]   Culture, blood (routine x 2)     Status: Abnormal   Collection Time: 05/10/19  4:26 PM   Specimen: BLOOD LEFT HAND  Result Value Ref Range  Status   Specimen Description BLOOD LEFT HAND  Final   Special Requests   Final    BOTTLES DRAWN AEROBIC ONLY Blood Culture adequate volume   Culture  Setup Time   Final    GRAM POSITIVE COCCI AEROBIC BOTTLE ONLY CRITICAL VALUE NOTED.  VALUE IS CONSISTENT WITH PREVIOUSLY REPORTED AND CALLED VALUE. Performed at [facility], [address] [place], [place] [address]    Culture STREPTOCOCCUS GROUP G (A)  Final   Report Status 05/13/2019 FINAL  Final   Organism ID, Bacteria STREPTOCOCCUS GROUP G  Final      Susceptibility   Streptococcus group g - MIC*    CLINDAMYCIN <=0.25 SENSITIVE Sensitive     AMPICILLIN <=0.25 SENSITIVE Sensitive     ERYTHROMYCIN <=0.12 SENSITIVE Sensitive     VANCOMYCIN 0.25 SENSITIVE Sensitive     CEFTRIAXONE <=0.12 SENSITIVE Sensitive     LEVOFLOXACIN 0.5 SENSITIVE Sensitive     PENICILLIN Value in next row Sensitive      SENSITIVE<=0.06    * STREPTOCOCCUS GROUP G  Culture, blood (routine x 2)     Status: Abnormal   Collection Time: 05/10/19  4:26 PM   Specimen: BLOOD LEFT HAND  Result Value Ref Range Status   Specimen Description BLOOD LEFT HAND  Final   Special Requests   Final    BOTTLES DRAWN AEROBIC ONLY Blood Culture adequate volume   Culture  Setup Time   Final    GRAM POSITIVE COCCI AEROBIC BOTTLE ONLY CRITICAL RESULT CALLED TO, READ BACK BY AND VERIFIED WITH: Peter Minium PharmD 7:15 05/11/19 ([email or web address])    Culture (A)  Final    STREPTOCOCCUS GROUP G SUSCEPTIBILITIES PERFORMED ON PREVIOUS CULTURE WITHIN THE LAST 5 DAYS. Performed at [facility], [address], [place], [place] [address]    Report Status 05/13/2019 FINAL  Final  Blood Culture ID Panel (Reflexed)     Status: Abnormal   Collection Time: 05/10/19  4:26 PM  Result Value Ref Range Status   Enterococcus species NOT DETECTED NOT DETECTED Final   Listeria monocytogenes NOT DETECTED NOT DETECTED Final   Staphylococcus species NOT DETECTED NOT DETECTED Final   Staphylococcus  aureus (BCID) NOT DETECTED NOT DETECTED Final   Streptococcus species DETECTED (A) NOT DETECTED Final    Comment: Not Enterococcus species, Streptococcus agalactiae, Streptococcus pyogenes, or Streptococcus pneumoniae.  CRITICAL RESULT CALLED TO, READ BACK BY AND VERIFIED WITH: Peter Minium PharmD 7:15 05/11/19 ([email or web address])    Streptococcus agalactiae NOT DETECTED NOT DETECTED Final   Streptococcus pneumoniae NOT DETECTED NOT DETECTED Final   Streptococcus pyogenes NOT DETECTED NOT DETECTED Final   Acinetobacter baumannii NOT DETECTED NOT DETECTED Final   Enterobacteriaceae species NOT DETECTED NOT DETECTED Final   Enterobacter cloacae complex NOT DETECTED NOT DETECTED Final   Escherichia coli NOT DETECTED NOT DETECTED Final   Klebsiella oxytoca NOT DETECTED NOT DETECTED Final   Klebsiella pneumoniae NOT DETECTED NOT DETECTED Final   Proteus species NOT DETECTED NOT DETECTED Final   Serratia marcescens NOT DETECTED NOT DETECTED Final   Haemophilus influenzae NOT DETECTED NOT DETECTED Final   Neisseria meningitidis NOT DETECTED NOT DETECTED Final   Pseudomonas aeruginosa NOT DETECTED NOT DETECTED Final   Candida albicans NOT DETECTED NOT DETECTED Final   Candida glabrata NOT DETECTED NOT DETECTED Final   Candida krusei NOT DETECTED NOT DETECTED Final   Candida parapsilosis NOT DETECTED NOT DETECTED Final   Candida tropicalis NOT DETECTED NOT DETECTED Final    Comment: Performed at [facility], [address] [place], [place] [address]  Culture, Urine     Status: None   Collection Time: 05/10/19  7:13 PM   Specimen: Urine, Catheterized  Result Value Ref Range Status   Specimen Description URINE, CATHETERIZED  Final   Special Requests NONE  Final   Culture   Final    NO GROWTH Performed at [facility], [address], [place], [place] [address]    Report Status 05/11/2019 FINAL  Final  Culture, blood (routine x 2)     Status: Abnormal   Collection Time: 05/11/19 12:35 PM    Specimen: BLOOD  Result Value Ref Range Status   Specimen Description BLOOD RIGHT ANTECUBITAL  Final   Special Requests   Final    BOTTLES DRAWN AEROBIC ONLY Blood Culture results may not be optimal due to an inadequate volume of blood received in culture bottles   Culture  Setup Time   Final    GRAM POSITIVE COCCI AEROBIC BOTTLE ONLY CRITICAL RESULT CALLED TO, READ BACK BY AND VERIFIED WITH: C. WALSTON PHARMD, AT 1443 05/12/19 BY D. VANHOOK    Culture (A)  Final    STAPHYLOCOCCUS SPECIES (COAGULASE NEGATIVE) THE SIGNIFICANCE OF ISOLATING THIS ORGANISM FROM A SINGLE SET OF BLOOD CULTURES WHEN MULTIPLE SETS ARE DRAWN IS UNCERTAIN. PLEASE NOTIFY THE MICROBIOLOGY DEPARTMENT WITHIN ONE WEEK IF SPECIATION AND SENSITIVITIES ARE REQUIRED. Performed at [facility], [address], [place], [place] [address]    Report Status 05/13/2019 FINAL  Final  Culture, blood (routine x 2)     Status: None (Preliminary result)   Collection Time: 05/11/19 12:45 PM   Specimen: BLOOD RIGHT HAND  Result Value Ref Range Status   Specimen Description BLOOD RIGHT HAND  Final   Special Requests   Final    BOTTLES DRAWN AEROBIC ONLY Blood Culture results may not be optimal due to an inadequate volume of blood received in culture bottles   Culture   Final    NO GROWTH 2 DAYS Performed at [facility], [address] [place], [place] [address]    Report Status PENDING  Incomplete     Radiology Studies: No results found.  Scheduled Meds: . carvedilol  3.125 mg Oral BID  . Chlorhexidine Gluconate Cloth  6 each Topical Daily  . dexamethasone (  DECADRON) injection  6 mg Intravenous Q24H  . fluticasone  2 spray Each Nare Daily  . fluticasone furoate-vilanterol  1 puff Inhalation Daily  . insulin aspart  0-5 Units Subcutaneous QHS  . insulin aspart  0-9 Units Subcutaneous TID WC  . insulin aspart  3 Units Subcutaneous TID WC  . insulin glargine  32 Units Subcutaneous Daily  . loratadine  10 mg Oral  QPM  . multivitamin with minerals  1 tablet Oral Daily  . mupirocin ointment  1 application Nasal BID  . pantoprazole  40 mg Oral Daily  . polyethylene glycol  17 g Oral Daily  . Ensure Max Protein  11 oz Oral BID  . senna-docusate  2 tablet Oral BID  . umeclidinium bromide  1 puff Inhalation Daily  . vitamin C  500 mg Oral Daily  . zinc sulfate  220 mg Oral Daily   Continuous Infusions: . heparin 2,000 Units/hr (05/13/19 0434)  . penicillin g continuous IV infusion 12 Million Units (05/13/19 0849)     LOS: 7 days   Rickey Barbara, MD [facility] Pager On Amion  If 7PM-7AM, please contact night-coverage 05/13/2019, 4:25 PM

## 2019-05-13 NOTE — Progress Notes (Signed)
Inpatient Diabetes Program Recommendations  AACE/ADA: New Consensus Statement on Inpatient Glycemic Control (2015)  Target Ranges:  Prepandial:   less than 140 mg/dL      Peak postprandial:   less than 180 mg/dL (1-2 hours)      Critically ill patients:  140 - 180 mg/dL   Lab Results  Component Value Date   GLUCAP 301 (H) 05/13/2019   HGBA1C 7.4 (H) 03/24/2019    Review of Glycemic Control Results for Cody Hale, Cody Hale "DAVID" (MRN [id number]) as of 05/13/2019 10:45  Ref. Range 05/12/2019 07:10 05/12/2019 11:22 05/12/2019 15:11 05/12/2019 22:44 05/13/2019 08:33  Glucose-Capillary Latest Ref Range: 70 - 99 mg/dL 291 (H) 272 (H) 291 (H) 236 (H) 301 (H)   Diabetes history: DM2 Outpatient Diabetes medications: Tresiba 50 units daily, Novolog 5 units TID with meals, Farxiga 10 mg daily, Trulicity 1.5 mg Qweek Current orders for Inpatient glycemic control:   Lantus 32 units daily Novolog 3 units tid meal coverage Novolog 0-9 units TID with meals  Decadron 6 mg Daily Ensure max Creatinine 1.22 BUN 50  Inpatient Diabetes Program Recommendations:     -Increase Lantus to 38 units daily -Increase Novolog meal coverage to 5 units tid if eats 50%  Thank you, Tama Headings RN, MSN, BC-ADM Inpatient Diabetes Coordinator Team Pager [phone] (8a-5p)

## 2019-05-14 ENCOUNTER — Encounter (HOSPITAL_COMMUNITY)
Admission: EM | Disposition: A | Discharge status: Skilled Nursing Facility | Payer: Self-pay | Source: Home / Self Care | Attending: Internal Medicine

## 2019-05-14 ENCOUNTER — Inpatient Hospital Stay: Payer: Self-pay

## 2019-05-14 DIAGNOSIS — J9611 Chronic respiratory failure with hypoxia: Secondary | ICD-10-CM | POA: Diagnosis not present

## 2019-05-14 DIAGNOSIS — S32009A Unspecified fracture of unspecified lumbar vertebra, initial encounter for closed fracture: Secondary | ICD-10-CM | POA: Diagnosis not present

## 2019-05-14 DIAGNOSIS — U071 COVID-19: Secondary | ICD-10-CM | POA: Diagnosis not present

## 2019-05-14 LAB — CBC
HCT: 42.2 % (ref 39.0–52.0)
Hemoglobin: 13.3 g/dL (ref 13.0–17.0)
MCH: 27.7 pg (ref 26.0–34.0)
MCHC: 31.5 g/dL (ref 30.0–36.0)
MCV: 87.7 fL (ref 80.0–100.0)
Platelets: 101 10*3/uL — ABNORMAL LOW (ref 150–400)
RBC: 4.81 MIL/uL (ref 4.22–5.81)
RDW: 15.4 % (ref 11.5–15.5)
WBC: 12.9 10*3/uL — ABNORMAL HIGH (ref 4.0–10.5)
nRBC: 0 % (ref 0.0–0.2)

## 2019-05-14 LAB — HEPARIN LEVEL (UNFRACTIONATED): Heparin Unfractionated: 0.38 IU/mL (ref 0.30–0.70)

## 2019-05-14 LAB — GLUCOSE, CAPILLARY
Glucose-Capillary: 306 mg/dL — ABNORMAL HIGH (ref 70–99)
Glucose-Capillary: 226 mg/dL — ABNORMAL HIGH (ref 70–99)
Glucose-Capillary: 218 mg/dL — ABNORMAL HIGH (ref 70–99)
Glucose-Capillary: 184 mg/dL — ABNORMAL HIGH (ref 70–99)

## 2019-05-14 LAB — ABO/RH: ABO/RH(D): O POS

## 2019-05-14 SURGERY — ECHOCARDIOGRAM, TRANSESOPHAGEAL
Anesthesia: Monitor Anesthesia Care

## 2019-05-14 NOTE — Progress Notes (Signed)
Spoke to primary RN,  PICC  will be place tomorrow 11/7.

## 2019-05-14 NOTE — Progress Notes (Addendum)
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 Patient fell onto his buttock. Due to significant pain, pt was brought to the ER. Denies any incontinence of urine or bowel. In the ED, at [facility], CT showed unstable lumbar fracture and patient was transferred to [facility]. Patient had Covid test done which was positive. Patient reports some productive cough. CXR showing vascular congestion, otherwise unremarkable.  Other labs show BNP of 326 LDH 331 CRP 14.7 WBC 11.9 hemoglobin 13.8 platelets 104 creatinine 1.3. Neurosurgery has been consulted. Patient is being admitted for further management of lumbar fracture.   Assessment & Plan:   Active Problems:   OSA (obstructive sleep apnea)   Type II diabetes mellitus with manifestations (HCC)   Chronic systolic CHF (congestive heart failure) (HCC)   Hyperlipidemia with target LDL less than 70   Chronic atrial fibrillation   Chronic respiratory failure with hypoxia (HCC)   Unstable burst fracture of unspecified lumbar vertebra, initial encounter for closed fracture (HCC)   Closed unstable burst fracture of lumbar vertebra (HCC)   Closed unstable burst fracture of first lumbar vertebra (HCC)   Pressure injury of skin  Unstable L1-L2 fracture -Neurosurgery on board, surgery currently on hold given infectious w/u -Cardiology was consulted earlier, cleared patient for surgery, signed off.   -Reconsulted cardiology on 05/10/19 for significant PVCs, bigeminy -Dr. Horris Latino spoke to neurosurgery Dr. Kathyrn Sheriff on 05/10/2019, who was in agreement with starting patient on heparin drip and to closely monitor for any neuro  change (risk of hematoma in pt with spinal fracture), plans to postpone surgery due to bacteremia -Recently discussed with Neurosurgery, recommendation to remain on strict bed rest, remain flat -Neurosurgery anticipating surgery perhaps next week  Bacteremia 2/2 GBS -Remains afebrile at this time -CRP elevated -Blood cx pos for GBS. In setting of prosthetic mitral valve, recommendation for f/u TEE, pending at this time -Duration of abx to be determined pending TEE result -Pt is continued on penicillin per ID recs. Awaiting TEE results. Regardless of findings, pt would require prolonged IV abx, thus will request PICC per ID recommendation -Of note, 1/2 coag neg staph on repeat blood culture. Discussed with ID. Likely contaminant  COVID-19 viral infection -Currently desaturates while sleeping, has a history of OSA, cannot use CPAP due to Covid infection -Afebrile -CRP remains elevated, stable -D-dimer greater than 20 on admission (??Hx of ankylosing spondylitis) -Patient with high risk of thromboembolic events, continue heparin drip for [facility] -Lower extremity Doppler negative for DVT -Chest x-ray showing vascular congestion -Continued on Decadron, anticipate 10 day regimen. Completed remdesivir on 05/10/19 -Continue to trend inflammatory markers  AKI -Cr peaked to 1.63 from normal baseline -Likely 2/2 bacteremia vs pt on diuretics -Holding torsemide for now -Will avoid IVF as pt currently has CHF and Covid -Repeat bmet in AM  Thrombocytopenia -Suspect from COVID-19 infection -Remains stable with no active bleeding  Chronic A. Fib -Remains rate controlled -Continue Coreg -Continuing to hold Xarelto and continue on heparin gtt as tolerated  Chronic combined systolic and diastolic HF/history of bioprosthetic MVR/multiple PVCs noted on telemetry -Appears compensated -BNP 326 -Last EF was 40 to 45% in November 2019 -Cardiology reportedly reconsulted for multiple PVCs/bigeminy  -Monitor  electrolytes potassium, magnesium, replace as needed -Continued on torsemide, Coreg  Diabetes mellitus type 2 -Continue SSI, Accu-Cheks, hypoglycemic protocol  Hyperlipidemia -Continue statin as pt tolerates  DVT prophylaxis: heparin gtt Code Status: Full Family Communication: Pt in room, family not at bedside Disposition Plan: Uncertain at this time  Consultants:   Neurosurgery  ID  Procedures:     Antimicrobials: Anti-infectives (From admission, onward)   Start     Dose/Rate Route Frequency Ordered Stop   05/13/19 1000  penicillin G potassium 12 Million Units in dextrose 5 % 500 mL continuous infusion     12 Million Units 41.7 mL/hr over 12 Hours Intravenous Every 12 hours 05/12/19 1159     05/11/19 0730  cefTRIAXone (ROCEPHIN) 2 g in sodium chloride 0.9 % 100 mL IVPB  Status:  Discontinued     2 g 200 mL/hr over 30 Minutes Intravenous Every 24 hours 05/11/19 0721 05/12/19 1159   05/08/19 0200  remdesivir 100 mg in sodium chloride 0.9 % 250 mL IVPB     100 mg 500 mL/hr over 30 Minutes Intravenous Every 24 hours 05/07/19 0059 05/11/19 0513   05/07/19 0100  remdesivir 200 mg in sodium chloride 0.9 % 250 mL IVPB     200 mg 500 mL/hr over 30 Minutes Intravenous Once 05/07/19 0057 05/07/19 0512      Subjective: Without complaints this AM  Objective: Vitals:   05/13/19 0839 05/14/19 0020 05/14/19 0809 05/14/19 1200  BP: (!) 118/51 122/65 107/66 112/70  Pulse: 76 74 (!) 51   Resp:      Temp: 97.8 F (36.6 C) 98.9 F (37.2 C) 98.6 F (37 C) 98.7 F (37.1 C)  TempSrc:  Oral  Oral  SpO2: 100% 91% 91%   Weight:      Height:        Intake/Output Summary (Last 24 hours) at 05/14/2019 1453 Last data filed at 05/14/2019 0900 Gross per 24 hour  Intake 1463.35 ml  Output 2050 ml  Net -586.65 ml   Filed Weights   05/06/19 1457  Weight: 121.1 kg    Examination: General exam: Conversant, in no acute distress Respiratory system: normal chest rise,  clear, no audible wheezing Cardiovascular system: regular rhythm, s1-s2 Gastrointestinal system: Nondistended, nontender, pos BS Central nervous system: No seizures, no tremors Extremities: No cyanosis, no joint deformities Skin: No rashes, no pallor Psychiatry: Affect normal // no auditory hallucinations   Data Reviewed: I have personally reviewed following labs and imaging studies  CBC: Recent Labs  Lab 05/08/19 0500 05/09/19 0521 05/10/19 0406 05/11/19 0332 05/12/19 0344 05/13/19 0402 05/14/19 0505  WBC 11.3* 9.9 10.2 20.9* 11.3* 9.9 12.9*  NEUTROABS 10.3* 9.1* 9.5* 19.1* 10.4*  --   --   HGB 12.4* 12.2* 12.2* 12.3* 12.5* 12.9* 13.3  HCT 40.2 39.3 37.8* 39.4 39.4 41.6 42.2  MCV 90.1 88.7 86.3 87.0 87.2 88.3 87.7  PLT 108* 107* 111* 127* 119* 105* 101*   Basic Metabolic Panel: Recent Labs  Lab 05/08/19 0500 05/09/19 0521 05/09/19 1225 05/10/19 0406 05/11/19 0332 05/12/19 0344  NA 137 135  --  135 136 141  K 4.8 4.7  --  4.5 4.2 4.5  CL 101 100  --  98 97* 102  CO2 25 25  --  27 26 29   GLUCOSE 221* 207*  --  208* 171* 298*  BUN 36* 40*  --  40* 47* 50*  CREATININE 1.33* 1.20  --  1.09 1.63* 1.22  CALCIUM 8.7* 8.5*  --  8.4* 8.3* 8.3*  MG  --   --  1.9  --   --   --    GFR: Estimated Creatinine Clearance: 74.5 mL/min (by C-G formula based on SCr of 1.22 mg/dL). Liver Function Tests: Recent Labs  Lab 05/08/19 0500 05/09/19 0521 05/10/19 0406 05/11/19 0332 05/12/19 0344  AST 43* 32 24 22 16   ALT 20 19 18 17 17   ALKPHOS 60 58 61 64 69  BILITOT 0.7 0.8 0.9 1.9* 1.2  PROT 6.0* 5.8* 5.6* 5.6* 5.7*  ALBUMIN 2.8* 2.6* 2.5* 2.3* 2.2*   No results for input(s): LIPASE, AMYLASE in the last 168 hours. No results for input(s): AMMONIA in the last 168 hours. Coagulation Profile: No results for input(s): INR, PROTIME in the last 168 hours. Cardiac Enzymes: No results for input(s): CKTOTAL, CKMB, CKMBINDEX, TROPONINI in the last 168 hours. BNP (last 3 results) No  results for input(s): PROBNP in the last 8760 hours. HbA1C: No results for input(s): HGBA1C in the last 72 hours. CBG: Recent Labs  Lab 05/13/19 1130 05/13/19 1710 05/13/19 2054 05/14/19 0805 05/14/19 1210  GLUCAP 312* 195* 158* 306* 226*   Lipid Profile: No results for input(s): CHOL, HDL, LDLCALC, TRIG, CHOLHDL, LDLDIRECT in the last 72 hours. Thyroid Function Tests: No results for input(s): TSH, T4TOTAL, FREET4, T3FREE, THYROIDAB in the last 72 hours. Anemia Panel: Recent Labs    05/12/19 0344  FERRITIN 337*   Sepsis Labs: Recent Labs  Lab 05/09/19 0521 05/10/19 0406 05/10/19 1626 05/10/19 1911 05/11/19 0332 05/12/19 0344  PROCALCITON 0.27 0.31  --   --  31.39 16.18  LATICACIDVEN  --   --  2.3* 3.5*  --   --     Recent Results (from the past 240 hour(s))  SARS Coronavirus 2 by RT PCR (hospital order, performed in [facility] hospital lab) Nasopharyngeal Nasopharyngeal Swab     Status: Abnormal   Collection Time: 05/06/19  6:25 PM   Specimen: Nasopharyngeal Swab  Result Value Ref Range Status   SARS Coronavirus 2 POSITIVE (A) NEGATIVE Final    Comment: RESULT CALLED TO, READ BACK BY AND VERIFIED WITH: J APPELT,RN@2021  05/06/19 MKELLY (NOTE) If result is NEGATIVE SARS-CoV-2 target nucleic acids are NOT DETECTED. The SARS-CoV-2 RNA is generally detectable in upper and lower  respiratory specimens during the acute phase of infection. The lowest  concentration of SARS-CoV-2 viral copies this assay can detect is 250  copies / mL. A negative result does not preclude SARS-CoV-2 infection  and should not be used as the sole basis for treatment or other  patient management decisions.  A negative result may occur with  improper specimen collection / handling, submission of specimen other  than nasopharyngeal swab, presence of viral mutation(s) within the  areas targeted by this assay, and inadequate number of viral copies  (<250 copies / mL). A negative result must be  combined with clinical  observations, patient history, and epidemiological information. If result is POSITIVE SARS-CoV-2 target nucleic acids are DETECTED. The  SARS-CoV-2 RNA is generally detectable in upper and lower  respiratory specimens during the acute phase of infection.  Positive  results are indicative of active infection with SARS-CoV-2.  Clinical  correlation with patient history and other diagnostic information is  necessary to determine patient infection status.  Positive results do  not rule out bacterial infection or co-infection with other viruses. If result is PRESUMPTIVE POSTIVE SARS-CoV-2 nucleic acids MAY BE PRESENT.   A presumptive positive result was obtained  on the submitted specimen  and confirmed on repeat testing.  While 2019 novel coronavirus  (SARS-CoV-2) nucleic acids may be present in the submitted sample  additional confirmatory testing may be necessary for epidemiological  and / or clinical management purposes  to differentiate between  SARS-CoV-2 and other Sarbecovirus currently known to infect humans.  If clinically indicated additional testing with an alternate test  methodology [phone] is ad vised. The SARS-CoV-2 RNA is generally  detectable in upper and lower respiratory specimens during the acute  phase of infection. The expected result is Negative. Fact Sheet for Patients:  BoilerBrush.com.cyhttps://www.fda.gov/media/136312/download Fact Sheet for Healthcare Providers: https://pope.com/https://www.fda.gov/media/136313/download This test is not yet approved or cleared by the [place] FDA and has been authorized for detection and/or diagnosis of SARS-CoV-2 by FDA under an Emergency Use Authorization (EUA).  This EUA will remain in effect (meaning this test can be used) for the duration of the COVID-19 declaration under Section 564(b)(1) of the Act, 21 U.S.C. section 360bbb-3(b)(1), unless the authorization is terminated or revoked sooner. Performed at [facility],  [address] [place], [place] [address]   Culture, blood (routine x 2)     Status: Abnormal   Collection Time: 05/10/19  4:26 PM   Specimen: BLOOD LEFT HAND  Result Value Ref Range Status   Specimen Description BLOOD LEFT HAND  Final   Special Requests   Final    BOTTLES DRAWN AEROBIC ONLY Blood Culture adequate volume   Culture  Setup Time   Final    GRAM POSITIVE COCCI AEROBIC BOTTLE ONLY CRITICAL VALUE NOTED.  VALUE IS CONSISTENT WITH PREVIOUSLY REPORTED AND CALLED VALUE. Performed at [facility], [address] [place], [place] [address]    Culture STREPTOCOCCUS GROUP G (A)  Final   Report Status 05/13/2019 FINAL  Final   Organism ID, Bacteria STREPTOCOCCUS GROUP G  Final      Susceptibility   Streptococcus group g - MIC*    CLINDAMYCIN <=0.25 SENSITIVE Sensitive     AMPICILLIN <=0.25 SENSITIVE Sensitive     ERYTHROMYCIN <=0.12 SENSITIVE Sensitive     VANCOMYCIN 0.25 SENSITIVE Sensitive     CEFTRIAXONE <=0.12 SENSITIVE Sensitive     LEVOFLOXACIN 0.5 SENSITIVE Sensitive     PENICILLIN Value in next row Sensitive      SENSITIVE<=0.06    * STREPTOCOCCUS GROUP G  Culture, blood (routine x 2)     Status: Abnormal   Collection Time: 05/10/19  4:26 PM   Specimen: BLOOD LEFT HAND  Result Value Ref Range Status   Specimen Description BLOOD LEFT HAND  Final   Special Requests   Final    BOTTLES DRAWN AEROBIC ONLY Blood Culture adequate volume   Culture  Setup Time   Final    GRAM POSITIVE COCCI AEROBIC BOTTLE ONLY CRITICAL RESULT CALLED TO, READ BACK BY AND VERIFIED WITH: Peter MiniumJ. Frens PharmD 7:15 05/11/19 ([email or web address])    Culture (A)  Final    STREPTOCOCCUS GROUP G SUSCEPTIBILITIES PERFORMED ON PREVIOUS CULTURE WITHIN THE LAST 5 DAYS. Performed at [facility], [address] [place], [place] [address]    Report Status 05/13/2019 FINAL  Final  Blood Culture ID Panel (Reflexed)     Status: Abnormal   Collection Time: 05/10/19  4:26 PM  Result Value Ref Range Status    Enterococcus species NOT DETECTED NOT DETECTED Final   Listeria monocytogenes NOT DETECTED NOT DETECTED Final   Staphylococcus species NOT DETECTED NOT DETECTED Final   Staphylococcus  aureus (BCID) NOT DETECTED NOT DETECTED Final   Streptococcus species DETECTED (A) NOT DETECTED Final    Comment: Not Enterococcus species, Streptococcus agalactiae, Streptococcus pyogenes, or Streptococcus pneumoniae. CRITICAL RESULT CALLED TO, READ BACK BY AND VERIFIED WITH: Peter Minium PharmD 7:15 05/11/19 ([email or web address])    Streptococcus agalactiae NOT DETECTED NOT DETECTED Final   Streptococcus pneumoniae NOT DETECTED NOT DETECTED Final   Streptococcus pyogenes NOT DETECTED NOT DETECTED Final   Acinetobacter baumannii NOT DETECTED NOT DETECTED Final   Enterobacteriaceae species NOT DETECTED NOT DETECTED Final   Enterobacter cloacae complex NOT DETECTED NOT DETECTED Final   Escherichia coli NOT DETECTED NOT DETECTED Final   Klebsiella oxytoca NOT DETECTED NOT DETECTED Final   Klebsiella pneumoniae NOT DETECTED NOT DETECTED Final   Proteus species NOT DETECTED NOT DETECTED Final   Serratia marcescens NOT DETECTED NOT DETECTED Final   Haemophilus influenzae NOT DETECTED NOT DETECTED Final   Neisseria meningitidis NOT DETECTED NOT DETECTED Final   Pseudomonas aeruginosa NOT DETECTED NOT DETECTED Final   Candida albicans NOT DETECTED NOT DETECTED Final   Candida glabrata NOT DETECTED NOT DETECTED Final   Candida krusei NOT DETECTED NOT DETECTED Final   Candida parapsilosis NOT DETECTED NOT DETECTED Final   Candida tropicalis NOT DETECTED NOT DETECTED Final    Comment: Performed at [facility], [address] [place], [place] [address]  Culture, Urine     Status: None   Collection Time: 05/10/19  7:13 PM   Specimen: Urine, Catheterized  Result Value Ref Range Status   Specimen Description URINE, CATHETERIZED  Final   Special Requests NONE  Final   Culture   Final    NO GROWTH Performed at [facility], [address] [place], [place] [address]    Report Status 05/11/2019 FINAL  Final  Culture, blood (routine x 2)     Status: Abnormal   Collection Time: 05/11/19 12:35 PM   Specimen: BLOOD  Result Value Ref Range Status   Specimen Description BLOOD RIGHT ANTECUBITAL  Final   Special Requests   Final    BOTTLES DRAWN AEROBIC ONLY Blood Culture results may not be optimal due to an inadequate volume of blood received in culture bottles   Culture  Setup Time   Final    GRAM POSITIVE COCCI AEROBIC BOTTLE ONLY CRITICAL RESULT CALLED TO, READ BACK BY AND VERIFIED WITH: C. WALSTON PHARMD, AT 1443 05/12/19 BY D. VANHOOK    Culture (A)  Final    STAPHYLOCOCCUS SPECIES (COAGULASE NEGATIVE) THE SIGNIFICANCE OF ISOLATING THIS ORGANISM FROM A SINGLE SET OF BLOOD CULTURES WHEN MULTIPLE SETS ARE DRAWN IS UNCERTAIN. PLEASE NOTIFY THE MICROBIOLOGY DEPARTMENT WITHIN ONE WEEK IF SPECIATION AND SENSITIVITIES ARE REQUIRED. Performed at [facility], [address], [place], [place] [address]    Report Status 05/13/2019 FINAL  Final  Culture, blood (routine x 2)     Status: None (Preliminary result)   Collection Time: 05/11/19 12:45 PM   Specimen: BLOOD RIGHT HAND  Result Value Ref Range Status   Specimen Description BLOOD RIGHT HAND  Final   Special Requests   Final    BOTTLES DRAWN AEROBIC ONLY Blood Culture results may not be optimal due to an inadequate volume of blood received in culture bottles   Culture   Final    NO GROWTH 3 DAYS Performed at [facility], [address], [place], [place] [address]    Report Status PENDING  Incomplete  Radiology Studies: [facility]  Result Date: 05/14/2019 [facility] not [facility], placement could not be confirmed due to current cardiac rhythm.   Scheduled Meds: . carvedilol  3.125 mg Oral BID  . Chlorhexidine Gluconate Cloth  6 each Topical Daily  . dexamethasone (DECADRON) injection  6 mg Intravenous Q24H  .  fluticasone  2 spray Each Nare Daily  . fluticasone furoate-vilanterol  1 puff Inhalation Daily  . insulin aspart  0-5 Units Subcutaneous QHS  . insulin aspart  0-9 Units Subcutaneous TID WC  . insulin aspart  5 Units Subcutaneous TID WC  . insulin glargine  38 Units Subcutaneous Daily  . loratadine  10 mg Oral QPM  . multivitamin with minerals  1 tablet Oral Daily  . pantoprazole  40 mg Oral Daily  . polyethylene glycol  17 g Oral Daily  . Ensure Max Protein  11 oz Oral BID  . senna-docusate  2 tablet Oral BID  . umeclidinium bromide  1 puff Inhalation Daily  . vitamin C  500 mg Oral Daily  . zinc sulfate  220 mg Oral Daily   Continuous Infusions: . heparin 2,000 Units/hr (05/14/19 1044)  . penicillin g continuous IV infusion 12 Million Units (05/14/19 1046)     LOS: 8 days   Rickey BarbaraStephen Marice Guidone, MD [facility] Pager On Amion  If 7PM-7AM, please contact night-coverage 05/14/2019, 2:53 PM

## 2019-05-14 NOTE — Plan of Care (Signed)
  Problem: Education: Goal: Knowledge of General Education information will improve Description: Including pain rating scale, medication(s)/side effects and non-pharmacologic comfort measures Outcome: Not Progressing   Problem: Health Behavior/Discharge Planning: Goal: Ability to manage health-related needs will improve Outcome: Not Progressing   Problem: Nutrition: Goal: Adequate nutrition will be maintained Outcome: Not Progressing   Problem: Coping: Goal: Level of anxiety will decrease Outcome: Not Progressing   Problem: Pain Managment: Goal: General experience of comfort will improve Outcome: Not Progressing   

## 2019-05-14 NOTE — Progress Notes (Signed)
    [place] for Infectious Disease   Reason for visit: Follow up on bacteremia  Interval History: no TEE yet planned.  On penicillin.    Physical Exam: Constitutional:  Vitals:   05/14/19 0020 05/14/19 0809  BP: 122/65 107/66  Pulse: 74 (!) 51  Resp:    Temp: 98.9 F (37.2 C) 98.6 F (37 C)  SpO2: 91% 91%   patient appears in NAD  Impression: Stable bacteremia.  Plan: 1.  Continue penicillin.  If he does have endocarditis, will need to add gentamicin and would need TCTS consultation.   If his TEE is negative, he will still need IV penicillin regardless. Ok from [place] standpoint for a picc line   Dr. Tommy Medal on after 1pm and over the weekend, otherwise I will follow up again on Monday

## 2019-05-15 ENCOUNTER — Inpatient Hospital Stay (HOSPITAL_COMMUNITY): Payer: Medicare Other

## 2019-05-15 DIAGNOSIS — I5022 Chronic systolic (congestive) heart failure: Secondary | ICD-10-CM | POA: Diagnosis not present

## 2019-05-15 DIAGNOSIS — I482 Chronic atrial fibrillation, unspecified: Secondary | ICD-10-CM | POA: Diagnosis not present

## 2019-05-15 LAB — HEPARIN LEVEL (UNFRACTIONATED): Heparin Unfractionated: 0.31 IU/mL (ref 0.30–0.70)

## 2019-05-15 LAB — COMPREHENSIVE METABOLIC PANEL
ALT: 13 U/L (ref 0–44)
AST: 15 U/L (ref 15–41)
Albumin: 2 g/dL — ABNORMAL LOW (ref 3.5–5.0)
Alkaline Phosphatase: 64 U/L (ref 38–126)
Anion gap: 8 (ref 5–15)
BUN: 21 mg/dL (ref 8–23)
CO2: 25 mmol/L (ref 22–32)
Calcium: 8.3 mg/dL — ABNORMAL LOW (ref 8.9–10.3)
Chloride: 99 mmol/L (ref 98–111)
Creatinine, Ser: 0.8 mg/dL (ref 0.61–1.24)
GFR calc Af Amer: >60 mL/min (ref >60)
GFR calc non Af Amer: >60 mL/min (ref >60)
Glucose, Bld: 283 mg/dL — ABNORMAL HIGH (ref 70–99)
Potassium: 5.3 mmol/L — ABNORMAL HIGH (ref 3.5–5.1)
Sodium: 132 mmol/L — ABNORMAL LOW (ref 135–145)
Total Bilirubin: 1.6 mg/dL — ABNORMAL HIGH (ref 0.3–1.2)
Total Protein: 5.6 g/dL — ABNORMAL LOW (ref 6.5–8.1)

## 2019-05-15 LAB — C-REACTIVE PROTEIN: CRP: 7.4 mg/dL — ABNORMAL HIGH (ref <1.0)

## 2019-05-15 LAB — CBC
HCT: 40.8 % (ref 39.0–52.0)
Hemoglobin: 13.1 g/dL (ref 13.0–17.0)
MCH: 27.6 pg (ref 26.0–34.0)
MCHC: 32.1 g/dL (ref 30.0–36.0)
MCV: 85.9 fL (ref 80.0–100.0)
Platelets: 83 10*3/uL — ABNORMAL LOW (ref 150–400)
RBC: 4.75 MIL/uL (ref 4.22–5.81)
RDW: 15.3 % (ref 11.5–15.5)
WBC: 9 10*3/uL (ref 4.0–10.5)
nRBC: 0 % (ref 0.0–0.2)

## 2019-05-15 LAB — FERRITIN: Ferritin: 212 ng/mL (ref 24–336)

## 2019-05-15 LAB — GLUCOSE, CAPILLARY
Glucose-Capillary: 306 mg/dL — ABNORMAL HIGH (ref 70–99)
Glucose-Capillary: 262 mg/dL — ABNORMAL HIGH (ref 70–99)
Glucose-Capillary: 233 mg/dL — ABNORMAL HIGH (ref 70–99)
Glucose-Capillary: 161 mg/dL — ABNORMAL HIGH (ref 70–99)
Glucose-Capillary: 151 mg/dL — ABNORMAL HIGH (ref 70–99)

## 2019-05-15 MED ORDER — INSULIN ASPART 100 UNIT/ML ~~LOC~~ SOLN
7.0000 [IU] | Freq: Three times a day (TID) | SUBCUTANEOUS | Status: DC
  Administered 2019-05-16 (×2): 7 [IU] via SUBCUTANEOUS

## 2019-05-15 MED ORDER — PENICILLIN G POTASSIUM 20000000 UNITS IJ SOLR
12.0000 10*6.[IU] | Freq: Two times a day (BID) | INTRAVENOUS | Status: AC
  Administered 2019-05-15 – 2019-05-24 (×18): 12 10*6.[IU] via INTRAVENOUS
  Filled 2019-05-15 (×2): qty 12
  Filled 2019-05-15: qty 5
  Filled 2019-05-15 (×5): qty 12
  Filled 2019-05-15: qty 5
  Filled 2019-05-15 (×10): qty 12
  Filled 2019-05-15: qty 5
  Filled 2019-05-15 (×2): qty 12

## 2019-05-15 MED ORDER — BISACODYL 10 MG RE SUPP
10.0000 mg | Freq: Once | RECTAL | Status: AC
  Administered 2019-05-15: 10 mg via RECTAL
  Filled 2019-05-15: qty 1

## 2019-05-15 MED ORDER — DOCUSATE SODIUM 100 MG PO CAPS
100.0000 mg | ORAL_CAPSULE | Freq: Every day | ORAL | Status: DC
  Administered 2019-05-17 – 2019-05-20 (×4): 100 mg via ORAL
  Filled 2019-05-15 (×6): qty 1

## 2019-05-15 MED ORDER — INSULIN GLARGINE 100 UNIT/ML ~~LOC~~ SOLN
42.0000 [IU] | Freq: Every day | SUBCUTANEOUS | Status: DC
  Administered 2019-05-16: 42 [IU] via SUBCUTANEOUS
  Filled 2019-05-15: qty 0.42

## 2019-05-15 MED ORDER — LACTULOSE 10 GM/15ML PO SOLN
20.0000 g | ORAL | Status: AC
  Administered 2019-05-15: 20 g via ORAL
  Filled 2019-05-15: qty 30

## 2019-05-15 NOTE — Progress Notes (Signed)
Peripherally Inserted Central Catheter/Midline Placement  The IV Nurse has discussed with the patient and/or persons authorized to consent for the patient, the purpose of this procedure and the potential benefits and risks involved with this procedure.  The benefits include less needle sticks, lab draws from the catheter, and the patient may be discharged home with the catheter. Risks include, but not limited to, infection, bleeding, blood clot (thrombus formation), and puncture of an artery; nerve damage and irregular heartbeat and possibility to perform a PICC exchange if needed/ordered by physician.  Alternatives to this procedure were also discussed.  Bard Power PICC patient education guide, fact sheet on infection prevention and patient information card has been provided to patient /or left at bedside.  Telephone consent obtained from son.  PICC/Midline Placement Documentation  PICC Double Lumen 05/15/19 PICC Right Cephalic 41 cm 2 cm (Active)  Indication for Insertion or Continuance of Line Poor Vasculature-patient has had multiple peripheral attempts or PIVs lasting less than 24 hours;Limited venous access - need for IV therapy >5 days (PICC only);Prolonged intravenous therapies 05/15/19 1016  Exposed Catheter (cm) 2 cm 05/15/19 1016  Site Assessment Clean;Dry;Intact 05/15/19 1016  Lumen #1 Status Flushed;Saline locked;Blood return noted 05/15/19 1016  Lumen #2 Status Flushed;Saline locked;Blood return noted 05/15/19 1016  Dressing Type Transparent 05/15/19 1016  Dressing Status Clean;Dry;Antimicrobial disc in place;Intact 05/15/19 1016  Line Care Connections checked and tightened 05/15/19 1016  Line Adjustment (NICU/IV Team Only) No 05/15/19 1016  Dressing Intervention New dressing 05/15/19 1016  Dressing Change Due 05/22/19 05/15/19 1016       Rolena Infante 05/15/2019, 10:16 AM

## 2019-05-15 NOTE — Progress Notes (Addendum)
PCXR results obtained.  PICC tip not in SVC as per expectations from placement.  Dr Wyline Copas notified directly after placement re difficulty threading PICC beyond subclavian region, pt agitated and restless.  Orders obtained to d/c PICC line and Dr Wyline Copas will refer to IR for PICC placement if necessary.  Dr Wyline Copas and Justice Rocher RN updated.

## 2019-05-15 NOTE — Progress Notes (Signed)
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 Patient fell onto his buttock. Due to significant pain, pt was brought to the ER. Denies any incontinence of urine or bowel. In the ED, at [facility], CT showed unstable lumbar fracture and patient was transferred to [facility]. Patient had Covid test done which was positive. Patient reports some productive cough. CXR showing vascular congestion, otherwise unremarkable.  Other labs show BNP of 326 LDH 331 CRP 14.7 WBC 11.9 hemoglobin 13.8 platelets 104 creatinine 1.3. Neurosurgery has been consulted. Patient is being admitted for further management of lumbar fracture.   Assessment & Plan:   Active Problems:   OSA (obstructive sleep apnea)   Type II diabetes mellitus with manifestations (HCC)   Chronic systolic CHF (congestive heart failure) (HCC)   Hyperlipidemia with target LDL less than 70   Chronic atrial fibrillation   Chronic respiratory failure with hypoxia (HCC)   Unstable burst fracture of unspecified lumbar vertebra, initial encounter for closed fracture (HCC)   Closed unstable burst fracture of lumbar vertebra (HCC)   Closed unstable burst fracture of first lumbar vertebra (HCC)   Pressure injury of skin  Unstable L1-L2 fracture -Neurosurgery on board, surgery currently on hold given infectious w/u -Cardiology was consulted earlier, cleared patient for surgery, signed off.   -Reconsulted cardiology on 05/10/19 for significant PVCs, bigeminy -Dr. Sharolyn Douglas spoke to neurosurgery Dr. Conchita Paris on 05/10/2019, who was in agreement with starting patient on heparin drip and to closely monitor for any neuro  change (risk of hematoma in pt with spinal fracture), plans to postpone surgery due to bacteremia -Recently discussed with Neurosurgery, recommendation to remain on strict bed rest, remain flat -Neurosurgery anticipating surgery after below infectious work up is complete  Bacteremia 2/2 GBS -Remains afebrile at this time -CRP elevated -Blood cx pos for GBS. In setting of prosthetic mitral valve, recommendation for f/u TEE, remains pending at this time -Duration of abx to be determined pending TEE result -Pt is continued on penicillin per ID recs. Awaiting TEE results. Regardless of findings, pt would require prolonged IV abx, PICC ordered and is pending -Of note, 1/2 coag neg staph on repeat blood culture. Discussed with ID. Likely contaminant. Stable.  COVID-19 viral infection -Currently desaturates while sleeping, has a history of OSA, cannot use CPAP due to Covid infection -Afebrile -CRP remains elevated but now trending down -D-dimer greater than 20 on admission (??Hx of ankylosing spondylitis) -Patient with high risk of thromboembolic events, continue heparin drip for [facility] -Lower extremity Doppler negative for DVT -Chest x-ray showing vascular congestion -Continued on Decadron, anticipate completing 10 day regimen. Completed remdesivir on 05/10/19  AKI -Cr peaked to 1.63 from normal baseline -Likely 2/2 bacteremia vs pt on diuretics -Holding torsemide for now  -Will avoid IVF as pt currently has CHF and Covid -Renal function has normalized  Thrombocytopenia -Suspect from COVID-19 infection -Remains stable with no active bleeding  Chronic A. Fib -Remains rate controlled -Continue Coreg -Continuing to hold Xarelto and continue on heparin gtt as tolerated  Chronic combined systolic and diastolic HF/history of bioprosthetic MVR/multiple PVCs noted on telemetry -Appears compensated -BNP 326 -Last EF was 40 to 45% in November 2019 -Cardiology reportedly reconsulted for  multiple PVCs/bigeminy -Monitor electrolytes potassium, magnesium, replace as needed -Continued on Coreg  Diabetes mellitus type 2 -Continue SSI, Accu-Cheks, hypoglycemic protocol -glucose in the 200-300's -will increase lantus to 42 units with meal coverage increased to 7 units  Hyperlipidemia -Continue statin as pt tolerates  Constipation -No documented BM recently -Pt noted to have vomited this afternoon -On narcotics for analgesia -Will start colase -Obtain abd xray to r/o obstruction  DVT prophylaxis: heparin gtt Code Status: Full Family Communication: Pt in room, family not at bedside Disposition Plan: Uncertain at this time  Consultants:   Neurosurgery  ID  Procedures:     Antimicrobials: Anti-infectives (From admission, onward)   Start     Dose/Rate Route Frequency Ordered Stop   05/15/19 1430  penicillin G potassium 12 Million Units in dextrose 5 % 500 mL continuous infusion     12 Million Units 41.7 mL/hr over 12 Hours Intravenous Every 12 hours 05/15/19 1343     05/13/19 1000  penicillin G potassium 12 Million Units in dextrose 5 % 500 mL continuous infusion  Status:  Discontinued     12 Million Units 41.7 mL/hr over 12 Hours Intravenous Every 12 hours 05/12/19 1159 05/15/19 1343   05/11/19 0730  cefTRIAXone (ROCEPHIN) 2 g in sodium chloride 0.9 % 100 mL IVPB  Status:  Discontinued     2 g 200 mL/hr over 30 Minutes Intravenous Every 24 hours 05/11/19 0721 05/12/19 1159   05/08/19 0200  remdesivir 100 mg in sodium chloride 0.9 % 250 mL IVPB     100 mg 500 mL/hr over 30 Minutes Intravenous Every 24 hours 05/07/19 0059 05/11/19 0513   05/07/19 0100  remdesivir 200 mg in sodium chloride 0.9 % 250 mL IVPB     200 mg 500 mL/hr over 30 Minutes Intravenous Once 05/07/19 0057 05/07/19 0512      Subjective: Without complaints this AM  Objective: Vitals:   05/14/19 1726 05/14/19 1920 05/15/19 0821 05/15/19 1135  BP: 139/73 118/67 (!) 145/73   Pulse: 80  73    Resp:  20 16   Temp: 97.8 F (36.6 C) 98.2 F (36.8 C)  (!) 97.2 F (36.2 C)  TempSrc: Oral Oral  Axillary  SpO2: 95% 94% 96%   Weight:      Height:        Intake/Output Summary (Last 24 hours) at 05/15/2019 1412 Last data filed at 05/15/2019 0400 Gross per 24 hour  Intake 480 ml  Output 1875 ml  Net -1395 ml   Filed Weights   05/06/19 1457  Weight: 121.1 kg    Examination: General exam: Awake, laying in bed, in nad Respiratory system: Normal respiratory effort, no wheezing Cardiovascular system: regular rate, s1, s2 Gastrointestinal system: Soft, nondistended Central nervous system: CN2-12 grossly intact, strength intact Extremities: Perfused, no clubbing Skin: Normal skin turgor, no notable skin lesions seen Psychiatry: Mood normal // no visual hallucinations   Data Reviewed: I have personally reviewed following labs and imaging studies  CBC: Recent Labs  Lab 05/09/19 0521 05/10/19 0406 05/11/19 0332 05/12/19 0344 05/13/19 0402 05/14/19 0505 05/15/19 0334  WBC 9.9 10.2 20.9* 11.3* 9.9 12.9* 9.0  NEUTROABS 9.1* 9.5* 19.1* 10.4*  --   --   --   HGB 12.2* 12.2* 12.3* 12.5* 12.9* 13.3 13.1  HCT 39.3 37.8* 39.4 39.4 41.6 42.2 40.8  MCV 88.7 86.3 87.0 87.2 88.3 87.7 85.9  PLT 107* 111* 127* 119* 105* 101* 83*   Basic Metabolic Panel: Recent Labs  Lab 05/09/19 0521 05/09/19 1225 05/10/19 0406 05/11/19 0332 05/12/19 0344 05/15/19 0334  NA 135  --  135 136 141 132*  K 4.7  --  4.5 4.2 4.5 5.3*  CL 100  --  98 97* 102 99  CO2 25  --  27 26 29 25   GLUCOSE 207*  --  208* 171* 298* 283*  BUN 40*  --  40* 47* 50* 21  CREATININE 1.20  --  1.09 1.63* 1.22 0.80  CALCIUM 8.5*  --  8.4* 8.3* 8.3* 8.3*  MG  --  1.9  --   --   --   --    GFR: Estimated Creatinine Clearance: 113.6 mL/min (by C-G formula based on SCr of 0.8 mg/dL). Liver Function Tests: Recent Labs  Lab 05/09/19 0521 05/10/19 0406 05/11/19 0332 05/12/19 0344 05/15/19 0334  AST 32 24 22  16 15   ALT 19 18 17 17 13   ALKPHOS 58 61 64 69 64  BILITOT 0.8 0.9 1.9* 1.2 1.6*  PROT 5.8* 5.6* 5.6* 5.7* 5.6*  ALBUMIN 2.6* 2.5* 2.3* 2.2* 2.0*   No results for input(s): LIPASE, AMYLASE in the last 168 hours. No results for input(s): AMMONIA in the last 168 hours. Coagulation Profile: No results for input(s): INR, PROTIME in the last 168 hours. Cardiac Enzymes: No results for input(s): CKTOTAL, CKMB, CKMBINDEX, TROPONINI in the last 168 hours. BNP (last 3 results) No results for input(s): PROBNP in the last 8760 hours. HbA1C: No results for input(s): HGBA1C in the last 72 hours. CBG: Recent Labs  Lab 05/14/19 1724 05/14/19 2135 05/15/19 0816 05/15/19 1122 05/15/19 1336  GLUCAP 218* 184* 306* 262* 233*   Lipid Profile: No results for input(s): CHOL, HDL, LDLCALC, TRIG, CHOLHDL, LDLDIRECT in the last 72 hours. Thyroid Function Tests: No results for input(s): TSH, T4TOTAL, FREET4, T3FREE, THYROIDAB in the last 72 hours. Anemia Panel: Recent Labs    05/15/19 0334  FERRITIN 212   Sepsis Labs: Recent Labs  Lab 05/09/19 0521 05/10/19 0406 05/10/19 1626 05/10/19 1911 05/11/19 0332 05/12/19 0344  PROCALCITON 0.27 0.31  --   --  31.39 16.18  LATICACIDVEN  --   --  2.3* 3.5*  --   --     Recent Results (from the past 240 hour(s))  SARS Coronavirus 2 by RT PCR (hospital order, performed in [place] hospital lab) Nasopharyngeal Nasopharyngeal Swab     Status: Abnormal   Collection Time: 05/06/19  6:25 PM   Specimen: Nasopharyngeal Swab  Result Value Ref Range Status   SARS Coronavirus 2 POSITIVE (A) NEGATIVE Final    Comment: RESULT CALLED TO, READ BACK BY AND VERIFIED WITH: J APPELT,RN@2021  05/06/19 MKELLY (NOTE) If result is NEGATIVE SARS-CoV-2 target nucleic acids are NOT DETECTED. The SARS-CoV-2 RNA is generally detectable in upper and lower  respiratory specimens during the acute phase of infection. The lowest  concentration of SARS-CoV-2 viral copies this  assay can detect is 250  copies / mL. A negative result does not preclude SARS-CoV-2 infection  and should not be used as the sole basis for treatment or other  patient management decisions.  A negative result may occur with  improper specimen collection / handling, submission of specimen other  than nasopharyngeal swab, presence of viral mutation(s) within the  areas targeted by this assay, and inadequate number of viral copies  (<250 copies / mL). A negative result must be combined with clinical  observations, patient history, and epidemiological information. If result is POSITIVE  SARS-CoV-2 target nucleic acids are DETECTED. The  SARS-CoV-2 RNA is generally detectable in upper and lower  respiratory specimens during the acute phase of infection.  Positive  results are indicative of active infection with SARS-CoV-2.  Clinical  correlation with patient history and other diagnostic information is  necessary to determine patient infection status.  Positive results do  not rule out bacterial infection or co-infection with other viruses. If result is PRESUMPTIVE POSTIVE SARS-CoV-2 nucleic acids MAY BE PRESENT.   A presumptive positive result was obtained on the submitted specimen  and confirmed on repeat testing.  While 2019 novel coronavirus  (SARS-CoV-2) nucleic acids may be present in the submitted sample  additional confirmatory testing may be necessary for epidemiological  and / or clinical management purposes  to differentiate between  SARS-CoV-2 and other Sarbecovirus currently known to infect humans.  If clinically indicated additional testing with an alternate test  methodology [phone]) is ad vised. The SARS-CoV-2 RNA is generally  detectable in upper and lower respiratory specimens during the acute  phase of infection. The expected result is Negative. Fact Sheet for Patients:  BoilerBrush.com.cy Fact Sheet for Healthcare Providers:  https://pope.com/ This test is not yet approved or cleared by the [place] FDA and has been authorized for detection and/or diagnosis of SARS-CoV-2 by FDA under an Emergency Use Authorization (EUA).  This EUA will remain in effect (meaning this test can be used) for the duration of the COVID-19 declaration under Section 564(b)(1) of the Act, 21 U.S.C. section 360bbb-3(b)(1), unless the authorization is terminated or revoked sooner. Performed at [facility], [address], [place], [place] [address]   Culture, blood (routine x 2)     Status: Abnormal   Collection Time: 05/10/19  4:26 PM   Specimen: BLOOD LEFT HAND  Result Value Ref Range Status   Specimen Description BLOOD LEFT HAND  Final   Special Requests   Final    BOTTLES DRAWN AEROBIC ONLY Blood Culture adequate volume   Culture  Setup Time   Final    GRAM POSITIVE COCCI AEROBIC BOTTLE ONLY CRITICAL VALUE NOTED.  VALUE IS CONSISTENT WITH PREVIOUSLY REPORTED AND CALLED VALUE. Performed at [facility], [address], [place], [place] [address]    Culture STREPTOCOCCUS GROUP G (A)  Final   Report Status 05/13/2019 FINAL  Final   Organism ID, Bacteria STREPTOCOCCUS GROUP G  Final      Susceptibility   Streptococcus group g - MIC*    CLINDAMYCIN <=0.25 SENSITIVE Sensitive     AMPICILLIN <=0.25 SENSITIVE Sensitive     ERYTHROMYCIN <=0.12 SENSITIVE Sensitive     VANCOMYCIN 0.25 SENSITIVE Sensitive     CEFTRIAXONE <=0.12 SENSITIVE Sensitive     LEVOFLOXACIN 0.5 SENSITIVE Sensitive     PENICILLIN Value in next row Sensitive      SENSITIVE<=0.06    * STREPTOCOCCUS GROUP G  Culture, blood (routine x 2)     Status: Abnormal   Collection Time: 05/10/19  4:26 PM   Specimen: BLOOD LEFT HAND  Result Value Ref Range Status   Specimen Description BLOOD LEFT HAND  Final   Special Requests   Final    BOTTLES DRAWN AEROBIC ONLY Blood Culture adequate volume   Culture  Setup Time   Final    GRAM  POSITIVE COCCI AEROBIC BOTTLE ONLY CRITICAL RESULT CALLED TO, READ BACK BY AND VERIFIED WITH: Peter Minium PharmD 7:15 05/11/19 ([email or web address])    Culture (A)  Final    STREPTOCOCCUS  GROUP G SUSCEPTIBILITIES PERFORMED ON PREVIOUS CULTURE WITHIN THE LAST 5 DAYS. Performed at [facility], [address] [place], [place] [address]    Report Status 05/13/2019 FINAL  Final  Blood Culture ID Panel (Reflexed)     Status: Abnormal   Collection Time: 05/10/19  4:26 PM  Result Value Ref Range Status   Enterococcus species NOT DETECTED NOT DETECTED Final   Listeria monocytogenes NOT DETECTED NOT DETECTED Final   Staphylococcus species NOT DETECTED NOT DETECTED Final   Staphylococcus aureus (BCID) NOT DETECTED NOT DETECTED Final   Streptococcus species DETECTED (A) NOT DETECTED Final    Comment: Not Enterococcus species, Streptococcus agalactiae, Streptococcus pyogenes, or Streptococcus pneumoniae. CRITICAL RESULT CALLED TO, READ BACK BY AND VERIFIED WITH: Peter MiniumJ. Frens PharmD 7:15 05/11/19 ([email or web address])    Streptococcus agalactiae NOT DETECTED NOT DETECTED Final   Streptococcus pneumoniae NOT DETECTED NOT DETECTED Final   Streptococcus pyogenes NOT DETECTED NOT DETECTED Final   Acinetobacter baumannii NOT DETECTED NOT DETECTED Final   Enterobacteriaceae species NOT DETECTED NOT DETECTED Final   Enterobacter cloacae complex NOT DETECTED NOT DETECTED Final   Escherichia coli NOT DETECTED NOT DETECTED Final   Klebsiella oxytoca NOT DETECTED NOT DETECTED Final   Klebsiella pneumoniae NOT DETECTED NOT DETECTED Final   Proteus species NOT DETECTED NOT DETECTED Final   Serratia marcescens NOT DETECTED NOT DETECTED Final   Haemophilus influenzae NOT DETECTED NOT DETECTED Final   Neisseria meningitidis NOT DETECTED NOT DETECTED Final   Pseudomonas aeruginosa NOT DETECTED NOT DETECTED Final   Candida albicans NOT DETECTED NOT DETECTED Final   Candida glabrata NOT DETECTED NOT DETECTED Final   Candida krusei NOT  DETECTED NOT DETECTED Final   Candida parapsilosis NOT DETECTED NOT DETECTED Final   Candida tropicalis NOT DETECTED NOT DETECTED Final    Comment: Performed at [facility], [address] [place], [place] [address]  Culture, Urine     Status: None   Collection Time: 05/10/19  7:13 PM   Specimen: Urine, Catheterized  Result Value Ref Range Status   Specimen Description URINE, CATHETERIZED  Final   Special Requests NONE  Final   Culture   Final    NO GROWTH Performed at [facility], [address] [place], [place] [address]    Report Status 05/11/2019 FINAL  Final  Culture, blood (routine x 2)     Status: Abnormal   Collection Time: 05/11/19 12:35 PM   Specimen: BLOOD  Result Value Ref Range Status   Specimen Description BLOOD RIGHT ANTECUBITAL  Final   Special Requests   Final    BOTTLES DRAWN AEROBIC ONLY Blood Culture results may not be optimal due to an inadequate volume of blood received in culture bottles   Culture  Setup Time   Final    GRAM POSITIVE COCCI AEROBIC BOTTLE ONLY CRITICAL RESULT CALLED TO, READ BACK BY AND VERIFIED WITH: C. WALSTON PHARMD, AT 1443 05/12/19 BY D. VANHOOK    Culture (A)  Final    STAPHYLOCOCCUS SPECIES (COAGULASE NEGATIVE) THE SIGNIFICANCE OF ISOLATING THIS ORGANISM FROM A SINGLE SET OF BLOOD CULTURES WHEN MULTIPLE SETS ARE DRAWN IS UNCERTAIN. PLEASE NOTIFY THE MICROBIOLOGY DEPARTMENT WITHIN ONE WEEK IF SPECIATION AND SENSITIVITIES ARE REQUIRED. Performed at [facility], [address] [place], [place] [address]    Report Status 05/13/2019 FINAL  Final  Culture, blood (routine x 2)     Status: None (Preliminary result)   Collection Time: 05/11/19 12:45  PM   Specimen: BLOOD RIGHT HAND  Result Value Ref Range Status   Specimen Description BLOOD RIGHT HAND  Final   Special Requests   Final    BOTTLES DRAWN AEROBIC ONLY Blood Culture results may not be optimal due to an inadequate volume of blood received in culture bottles    Culture   Final    NO GROWTH 4 DAYS Performed at [facility], [address] [place], [place] [address]    Report Status PENDING  Incomplete     Radiology Studies: Dg Chest Port 1 View  Result Date: 05/15/2019 CLINICAL DATA:  68 year old male with a history of PICC placement EXAM: PORTABLE CHEST 1 VIEW COMPARISON:  None. FINDINGS: Cardiomediastinal silhouette unchanged in size and contour with surgical changes of median sternotomy and cardiomegaly. No pneumothorax. No pleural effusion. Coarsened interstitial markings throughout. Right upper extremity PICC is redundant, without visualization of the point of termination of the tip. Redemonstration of partially healed right-sided rib fractures IMPRESSION: Right upper extremity PICC is redundant within the veins in the tip is not visualized. Repositioning is indicated. Similar appearance of the lungs with diffuse reticulonodular opacities. Similar appearance of surgical changes of median sternotomy and cardiomegaly. Electronically Signed   By: Gilmer MorJaime  Wagner D.O.   On: 05/15/2019 11:13   [place] Ekg Site Rite  Result Date: 05/14/2019 If Site Rite image not attached, placement could not be confirmed due to current cardiac rhythm.   Scheduled Meds: . carvedilol  3.125 mg Oral BID  . Chlorhexidine Gluconate Cloth  6 each Topical Daily  . dexamethasone (DECADRON) injection  6 mg Intravenous Q24H  . fluticasone  2 spray Each Nare Daily  . fluticasone furoate-vilanterol  1 puff Inhalation Daily  . insulin aspart  0-5 Units Subcutaneous QHS  . insulin aspart  0-9 Units Subcutaneous TID WC  . insulin aspart  5 Units Subcutaneous TID WC  . insulin glargine  38 Units Subcutaneous Daily  . loratadine  10 mg Oral QPM  . multivitamin with minerals  1 tablet Oral Daily  . pantoprazole  40 mg Oral Daily  . polyethylene glycol  17 g Oral Daily  . Ensure Max Protein  11 oz Oral BID  . senna-docusate  2 tablet Oral BID  . umeclidinium bromide  1 puff  Inhalation Daily  . vitamin C  500 mg Oral Daily  . zinc sulfate  220 mg Oral Daily   Continuous Infusions: . heparin 2,000 Units/hr (05/15/19 0149)  . penicillin g continuous IV infusion       LOS: 9 days   Rickey BarbaraStephen Chiu, MD [facility] Pager On Amion  If 7PM-7AM, please contact night-coverage 05/15/2019, 2:12 PM

## 2019-05-15 NOTE — Progress Notes (Signed)
ANTICOAGULATION CONSULT NOTE - Follow Up Consult  Pharmacy Consult for Heparin Indication: atrial fibrillation  Allergies  Allergen Reactions  . Lisinopril Cough  . Daptomycin Rash  . Metformin And Related Diarrhea  . Tape Rash and Other (See Comments)    Adhesive Tape-Burn skin.    Patient Measurements: Height: 5\' 9"  (175.3 cm) Weight: 267 lb (121.1 kg) IBW/kg (Calculated) : 70.7 Heparin Dosing Weight: 98 kg  Vital Signs:    Labs: Recent Labs    05/13/19 0402 05/14/19 0505 05/15/19 0334  HGB 12.9* 13.3 13.1  HCT 41.6 42.2 40.8  PLT 105* 101* 83*  HEPARINUNFRC 0.39 0.38 0.31  CREATININE  --   --  0.80    Estimated Creatinine Clearance: 113.6 mL/min (by C-G formula based on SCr of 0.8 mg/dL).   Assessment:  Anticoag: Xarelto PTA for Afib, LD 10/28 AM, d-dimer >20 >> switch to Heparin infusion HL 0.31 in goal, Hgb/Hct wnl, plts 101>83.  Goal of Therapy:  Heparin level 0.3-0.7 units/ml Monitor platelets by anticoagulation protocol: Yes   Plan:  - Continue Heparin at 2000 units/hr - Daily HL, CBC - F/u surgical plans (vertebrae repair) - considering week of 11/9, with regard to pausing heparin infusion only IF TEE is negative - Dex 6 q24h, monitor duration, should end 11/7, f/u stop date-messaged MD. - Watch CBGs and need to increase insulin    Cody Hale, PharmD, BCPS Clinical Staff Pharmacist Eilene Ghazi Stillinger 05/15/2019,7:51 AM

## 2019-05-15 NOTE — Progress Notes (Signed)
Spoke with Justice Rocher RN re PICC order.  Will attempt to obtain consent from family.

## 2019-05-15 NOTE — Progress Notes (Signed)
Pt continues to be treated for Covid-19 with secondary sepsis/bacterimia. Currently on Pen G, pending TEE for endocarditis. If TEE is negative and blood cultures remain negative, can consider operative stabilization of L1-2 Chance fracture next week. Obviously if TEE is positive for endocarditis, would not think implantation of multiple titanium screws would be prudent in which case would likely need to treat fracture conservatively. Will cont to monitor and follow up again next week.

## 2019-05-16 DIAGNOSIS — J9611 Chronic respiratory failure with hypoxia: Secondary | ICD-10-CM | POA: Diagnosis not present

## 2019-05-16 DIAGNOSIS — I5022 Chronic systolic (congestive) heart failure: Secondary | ICD-10-CM | POA: Diagnosis not present

## 2019-05-16 DIAGNOSIS — R7881 Bacteremia: Secondary | ICD-10-CM | POA: Diagnosis not present

## 2019-05-16 LAB — CBC
HCT: 41.3 % (ref 39.0–52.0)
Hemoglobin: 13.6 g/dL (ref 13.0–17.0)
MCH: 27.9 pg (ref 26.0–34.0)
MCHC: 32.9 g/dL (ref 30.0–36.0)
MCV: 84.8 fL (ref 80.0–100.0)
Platelets: 99 10*3/uL — ABNORMAL LOW (ref 150–400)
RBC: 4.87 MIL/uL (ref 4.22–5.81)
RDW: 15.3 % (ref 11.5–15.5)
WBC: 12.8 10*3/uL — ABNORMAL HIGH (ref 4.0–10.5)
nRBC: 0 % (ref 0.0–0.2)

## 2019-05-16 LAB — HEPARIN LEVEL (UNFRACTIONATED): Heparin Unfractionated: 0.66 IU/mL (ref 0.30–0.70)

## 2019-05-16 LAB — CULTURE, BLOOD (ROUTINE X 2): Culture: NO GROWTH

## 2019-05-16 LAB — GLUCOSE, CAPILLARY
Glucose-Capillary: 267 mg/dL — ABNORMAL HIGH (ref 70–99)
Glucose-Capillary: 232 mg/dL — ABNORMAL HIGH (ref 70–99)
Glucose-Capillary: 182 mg/dL — ABNORMAL HIGH (ref 70–99)
Glucose-Capillary: 158 mg/dL — ABNORMAL HIGH (ref 70–99)

## 2019-05-16 LAB — FERRITIN: Ferritin: 217 ng/mL (ref 24–336)

## 2019-05-16 LAB — C-REACTIVE PROTEIN: CRP: 5.7 mg/dL — ABNORMAL HIGH (ref <1.0)

## 2019-05-16 MED ORDER — INSULIN GLARGINE 100 UNIT/ML ~~LOC~~ SOLN
45.0000 [IU] | Freq: Every day | SUBCUTANEOUS | Status: DC
  Administered 2019-05-17 – 2019-05-23 (×7): 45 [IU] via SUBCUTANEOUS
  Filled 2019-05-16 (×7): qty 0.45

## 2019-05-16 MED ORDER — INSULIN ASPART 100 UNIT/ML ~~LOC~~ SOLN
8.0000 [IU] | Freq: Three times a day (TID) | SUBCUTANEOUS | Status: DC
  Administered 2019-05-17 – 2019-05-23 (×12): 8 [IU] via SUBCUTANEOUS

## 2019-05-16 NOTE — Progress Notes (Signed)
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 Patient fell onto his buttock. Due to significant pain, pt was brought to the ER. Denies any incontinence of urine or bowel. In the ED, at [facility], CT showed unstable lumbar fracture and patient was transferred to [facility]. Patient had Covid test done which was positive. Patient reports some productive cough. CXR showing vascular congestion, otherwise unremarkable.  Other labs show BNP of 326 LDH 331 CRP 14.7 WBC 11.9 hemoglobin 13.8 platelets 104 creatinine 1.3. Neurosurgery has been consulted. Patient is being admitted for further management of lumbar fracture.   Assessment & Plan:   Active Problems:   OSA (obstructive sleep apnea)   Type II diabetes mellitus with manifestations (HCC)   Chronic systolic CHF (congestive heart failure) (HCC)   Hyperlipidemia with target LDL less than 70   Chronic atrial fibrillation   Chronic respiratory failure with hypoxia (HCC)   Unstable burst fracture of unspecified lumbar vertebra, initial encounter for closed fracture (HCC)   Closed unstable burst fracture of lumbar vertebra (HCC)   Closed unstable burst fracture of first lumbar vertebra (HCC)   Pressure injury of skin  Unstable L1-L2 fracture -Neurosurgery on board, surgery currently on hold given infectious w/u -Cardiology was consulted earlier, cleared patient for surgery, signed off.   -Reconsulted cardiology on 05/10/19 for significant PVCs, bigeminy -Dr. Sharolyn Douglas spoke to neurosurgery Dr. Conchita Paris on 05/10/2019, who was in agreement with starting patient on heparin drip and to closely monitor for any neuro  change (risk of hematoma in pt with spinal fracture), plans to postpone surgery due to bacteremia -Recently discussed with Neurosurgery, recommendation to remain on strict bed rest, remain flat -Neurosurgery anticipating surgery after below infectious work up is complete  Bacteremia 2/2 GBS -Remains afebrile at this time -CRP elevated -Blood cx pos for GBS. In setting of prosthetic mitral valve, recommendation for f/u TEE, remains pending at this time -Duration of abx to be determined pending TEE result -Pt is continued on penicillin per ID recs. Awaiting TEE results. Regardless of findings, pt would require prolonged IV abx, PICC ordered and is pending at this time -Of note, 1/2 coag neg staph on repeat blood culture. Discussed with ID. Likely contaminant. Stable.  COVID-19 viral infection -Currently desaturates while sleeping, has a history of OSA, cannot use CPAP due to Covid infection -Afebrile -D-dimer greater than 20 on admission (??Hx of ankylosing spondylitis) -Patient with high risk of thromboembolic events, continue heparin drip for [facility] -Lower extremity Doppler negative for DVT -Chest x-ray showing vascular congestion -Completed course of decadron on 11/7 and completed remdesivir on 05/10/19 -CRP is steadily trending down  AKI -Cr peaked to 1.63 from normal baseline -Likely 2/2 bacteremia vs pt on diuretics -Holding torsemide for now  -Will avoid IVF as pt currently has CHF and Covid -Renal function has normalized, will repeat renal panel in AM  Thrombocytopenia -Suspect from COVID-19 infection -Plts now rising to 99  Chronic A. Fib -Remains rate controlled -Continue Coreg -Continuing to hold Xarelto and continue on heparin gtt as tolerated  Chronic combined systolic and diastolic HF/history of bioprosthetic MVR/multiple PVCs noted on telemetry -Appears compensated -BNP 326 -Last EF was 40 to 45% in November  2019 -Cardiology reportedly reconsulted for multiple  PVCs/bigeminy -Monitor electrolytes potassium, magnesium, replace as needed -Continued on Coreg as tolerated  Diabetes mellitus type 2 -Continue SSI, Accu-Cheks, hypoglycemic protocol -glucose in the 200's during the day -will increase lantus to 45 units with meal coverage increased to 8 units  Hyperlipidemia -Continue statin as pt tolerates  Constipation -Pt noted to have vomited recently with no documented bowel movement in several days in setting of narcotics for analgesia -personally reviewed abd xray. Stool seen in colon -Very good results with lactulose and dulcolax suppository   DVT prophylaxis: heparin gtt Code Status: Full Family Communication: Pt in room, family not at bedside Disposition Plan: Uncertain at this time  Consultants:   Neurosurgery  ID  Procedures:     Antimicrobials: Anti-infectives (From admission, onward)   Start     Dose/Rate Route Frequency Ordered Stop   05/15/19 1430  penicillin G potassium 12 Million Units in dextrose 5 % 500 mL continuous infusion     12 Million Units 41.7 mL/hr over 12 Hours Intravenous Every 12 hours 05/15/19 1343     05/13/19 1000  penicillin G potassium 12 Million Units in dextrose 5 % 500 mL continuous infusion  Status:  Discontinued     12 Million Units 41.7 mL/hr over 12 Hours Intravenous Every 12 hours 05/12/19 1159 05/15/19 1343   05/11/19 0730  cefTRIAXone (ROCEPHIN) 2 g in sodium chloride 0.9 % 100 mL IVPB  Status:  Discontinued     2 g 200 mL/hr over 30 Minutes Intravenous Every 24 hours 05/11/19 0721 05/12/19 1159   05/08/19 0200  remdesivir 100 mg in sodium chloride 0.9 % 250 mL IVPB     100 mg 500 mL/hr over 30 Minutes Intravenous Every 24 hours 05/07/19 0059 05/11/19 0513   05/07/19 0100  remdesivir 200 mg in sodium chloride 0.9 % 250 mL IVPB     200 mg 500 mL/hr over 30 Minutes Intravenous Once 05/07/19 0057 05/07/19 0512      Subjective: No complaints. Reports feeling better after BM overnight   Objective: Vitals:   05/15/19 1916 05/16/19 0000 05/16/19 0500 05/16/19 0847  BP: 125/69 132/69 126/65 105/61  Pulse: 79 80 76   Resp: 20 16 14 18   Temp: 97.8 F (36.6 C) 98.8 F (37.1 C) (!) 97.5 F (36.4 C) (!) 97.3 F (36.3 C)  TempSrc: Oral  Axillary Axillary  SpO2: 99%   97%  Weight:      Height:        Intake/Output Summary (Last 24 hours) at 05/16/2019 1639 Last data filed at 05/16/2019 0324 Gross per 24 hour  Intake 1384.99 ml  Output 1650 ml  Net -265.01 ml   Filed Weights   05/06/19 1457  Weight: 121.1 kg    Examination: General exam: Conversant, in no acute distress Respiratory system: normal chest rise, clear, no audible wheezing Cardiovascular system: regular rhythm, s1-s2 Gastrointestinal system: Nondistended, nontender, pos BS Central nervous system: No seizures, no tremors Extremities: No cyanosis, no joint deformities Skin: No rashes, no pallor Psychiatry: Affect normal // no auditory hallucinations   Data Reviewed: I have personally reviewed following labs and imaging studies  CBC: Recent Labs  Lab 05/10/19 0406 05/11/19 0332 05/12/19 0344 05/13/19 0402 05/14/19 0505 05/15/19 0334 05/16/19 0354  WBC 10.2 20.9* 11.3* 9.9 12.9* 9.0 12.8*  NEUTROABS 9.5* 19.1* 10.4*  --   --   --   --   HGB 12.2* 12.3* 12.5* 12.9* 13.3 13.1 13.6  HCT  37.8* 39.4 39.4 41.6 42.2 40.8 41.3  MCV 86.3 87.0 87.2 88.3 87.7 85.9 84.8  PLT 111* 127* 119* 105* 101* 83* 99*   Basic Metabolic Panel: Recent Labs  Lab 05/10/19 0406 05/11/19 0332 05/12/19 0344 05/15/19 0334  NA 135 136 141 132*  K 4.5 4.2 4.5 5.3*  CL 98 97* 102 99  CO2 27 26 29 25   GLUCOSE 208* 171* 298* 283*  BUN 40* 47* 50* 21  CREATININE 1.09 1.63* 1.22 0.80  CALCIUM 8.4* 8.3* 8.3* 8.3*   GFR: Estimated Creatinine Clearance: 113.6 mL/min (by C-G formula based on SCr of 0.8 mg/dL). Liver Function Tests: Recent Labs  Lab 05/10/19 0406 05/11/19 0332 05/12/19 0344 05/15/19 0334  AST  24 22 16 15   ALT 18 17 17 13   ALKPHOS 61 64 69 64  BILITOT 0.9 1.9* 1.2 1.6*  PROT 5.6* 5.6* 5.7* 5.6*  ALBUMIN 2.5* 2.3* 2.2* 2.0*   No results for input(s): LIPASE, AMYLASE in the last 168 hours. No results for input(s): AMMONIA in the last 168 hours. Coagulation Profile: No results for input(s): INR, PROTIME in the last 168 hours. Cardiac Enzymes: No results for input(s): CKTOTAL, CKMB, CKMBINDEX, TROPONINI in the last 168 hours. BNP (last 3 results) No results for input(s): PROBNP in the last 8760 hours. HbA1C: No results for input(s): HGBA1C in the last 72 hours. CBG: Recent Labs  Lab 05/15/19 1336 05/15/19 1748 05/15/19 2102 05/16/19 0836 05/16/19 1159  GLUCAP 233* 161* 151* 267* 232*   Lipid Profile: No results for input(s): CHOL, HDL, LDLCALC, TRIG, CHOLHDL, LDLDIRECT in the last 72 hours. Thyroid Function Tests: No results for input(s): TSH, T4TOTAL, FREET4, T3FREE, THYROIDAB in the last 72 hours. Anemia Panel: Recent Labs    05/15/19 0334 05/16/19 0354  FERRITIN 212 217   Sepsis Labs: Recent Labs  Lab 05/10/19 0406 05/10/19 1626 05/10/19 1911 05/11/19 0332 05/12/19 0344  PROCALCITON 0.31  --   --  31.39 16.18  LATICACIDVEN  --  2.3* 3.5*  --   --     Recent Results (from the past 240 hour(s))  SARS Coronavirus 2 by RT PCR (hospital order, performed in [facility] hospital lab) Nasopharyngeal Nasopharyngeal Swab     Status: Abnormal   Collection Time: 05/06/19  6:25 PM   Specimen: Nasopharyngeal Swab  Result Value Ref Range Status   SARS Coronavirus 2 POSITIVE (A) NEGATIVE Final    Comment: RESULT CALLED TO, READ BACK BY AND VERIFIED WITH: J APPELT,RN@2021  05/06/19 MKELLY (NOTE) If result is NEGATIVE SARS-CoV-2 target nucleic acids are NOT DETECTED. The SARS-CoV-2 RNA is generally detectable in upper and lower  respiratory specimens during the acute phase of infection. The lowest  concentration of SARS-CoV-2 viral copies this assay can detect is  250  copies / mL. A negative result does not preclude SARS-CoV-2 infection  and should not be used as the sole basis for treatment or other  patient management decisions.  A negative result may occur with  improper specimen collection / handling, submission of specimen other  than nasopharyngeal swab, presence of viral mutation(s) within the  areas targeted by this assay, and inadequate number of viral copies  (<250 copies / mL). A negative result must be combined with clinical  observations, patient history, and epidemiological information. If result is POSITIVE SARS-CoV-2 target nucleic acids are DETECTED. The  SARS-CoV-2 RNA is generally detectable in upper and lower  respiratory specimens during the acute phase of infection.  Positive  results are indicative  of active infection with SARS-CoV-2.  Clinical  correlation with patient history and other diagnostic information is  necessary to determine patient infection status.  Positive results do  not rule out bacterial infection or co-infection with other viruses. If result is PRESUMPTIVE POSTIVE SARS-CoV-2 nucleic acids MAY BE PRESENT.   A presumptive positive result was obtained on the submitted specimen  and confirmed on repeat testing.  While 2019 novel coronavirus  (SARS-CoV-2) nucleic acids may be present in the submitted sample  additional confirmatory testing may be necessary for epidemiological  and / or clinical management purposes  to differentiate between  SARS-CoV-2 and other Sarbecovirus currently known to infect humans.  If clinically indicated additional testing with an alternate test  methodology [id number]) is ad vised. The SARS-CoV-2 RNA is generally  detectable in upper and lower respiratory specimens during the acute  phase of infection. The expected result is Negative. Fact Sheet for Patients:  StrictlyIdeas.no Fact Sheet for Healthcare Providers:  BankingDealers.co.za This test is not yet approved or cleared by the [place] FDA and has been authorized for detection and/or diagnosis of SARS-CoV-2 by FDA under an Emergency Use Authorization (EUA).  This EUA will remain in effect (meaning this test can be used) for the duration of the COVID-19 declaration under Section 564(b)(1) of the Act, 21 U.S.C. section 360bbb-3(b)(1), unless the authorization is terminated or revoked sooner. Performed at [facility], [address], [place], [place] [address]   Culture, blood (routine x 2)     Status: Abnormal   Collection Time: 05/10/19  4:26 PM   Specimen: BLOOD LEFT HAND  Result Value Ref Range Status   Specimen Description BLOOD LEFT HAND  Final   Special Requests   Final    BOTTLES DRAWN AEROBIC ONLY Blood Culture adequate volume   Culture  Setup Time   Final    GRAM POSITIVE COCCI AEROBIC BOTTLE ONLY CRITICAL VALUE NOTED.  VALUE IS CONSISTENT WITH PREVIOUSLY REPORTED AND CALLED VALUE. Performed at [facility], [place] [address], [place], [place] [address]    Culture STREPTOCOCCUS GROUP G (A)  Final   Report Status 05/13/2019 FINAL  Final   Organism ID, Bacteria STREPTOCOCCUS GROUP G  Final      Susceptibility   Streptococcus group g - MIC*    CLINDAMYCIN <=0.25 SENSITIVE Sensitive     AMPICILLIN <=0.25 SENSITIVE Sensitive     ERYTHROMYCIN <=0.12 SENSITIVE Sensitive     VANCOMYCIN 0.25 SENSITIVE Sensitive     CEFTRIAXONE <=0.12 SENSITIVE Sensitive     LEVOFLOXACIN 0.5 SENSITIVE Sensitive     PENICILLIN Value in next row Sensitive      SENSITIVE<=0.06    * STREPTOCOCCUS GROUP G  Culture, blood (routine x 2)     Status: Abnormal   Collection Time: 05/10/19  4:26 PM   Specimen: BLOOD LEFT HAND  Result Value Ref Range Status   Specimen Description BLOOD LEFT HAND  Final   Special Requests   Final    BOTTLES DRAWN AEROBIC ONLY Blood Culture adequate volume   Culture  Setup Time   Final    GRAM  POSITIVE COCCI AEROBIC BOTTLE ONLY CRITICAL RESULT CALLED TO, READ BACK BY AND VERIFIED WITH: Andres Shad PharmD 7:15 05/11/19 ([email or web address])    Culture (A)  Final    STREPTOCOCCUS GROUP G SUSCEPTIBILITIES PERFORMED ON PREVIOUS CULTURE WITHIN THE LAST 5 DAYS. Performed at [facility], [place] [address], [place], [place] [address]    Report Status 05/13/2019 FINAL  Final  Blood Culture ID Panel (Reflexed)     Status: Abnormal   Collection Time: 05/10/19  4:26 PM  Result Value Ref Range Status   Enterococcus species NOT DETECTED NOT DETECTED Final   Listeria monocytogenes NOT DETECTED NOT DETECTED Final   Staphylococcus species NOT DETECTED NOT DETECTED Final   Staphylococcus aureus (BCID) NOT DETECTED NOT DETECTED Final   Streptococcus species DETECTED (A) NOT DETECTED Final    Comment: Not Enterococcus species, Streptococcus agalactiae, Streptococcus pyogenes, or Streptococcus pneumoniae. CRITICAL RESULT CALLED TO, READ BACK BY AND VERIFIED WITH: Peter MiniumJ. Frens PharmD 7:15 05/11/19 ([email or web address])    Streptococcus agalactiae NOT DETECTED NOT DETECTED Final   Streptococcus pneumoniae NOT DETECTED NOT DETECTED Final   Streptococcus pyogenes NOT DETECTED NOT DETECTED Final   Acinetobacter baumannii NOT DETECTED NOT DETECTED Final   Enterobacteriaceae species NOT DETECTED NOT DETECTED Final   Enterobacter cloacae complex NOT DETECTED NOT DETECTED Final   Escherichia coli NOT DETECTED NOT DETECTED Final   Klebsiella oxytoca NOT DETECTED NOT DETECTED Final   Klebsiella pneumoniae NOT DETECTED NOT DETECTED Final   Proteus species NOT DETECTED NOT DETECTED Final   Serratia marcescens NOT DETECTED NOT DETECTED Final   Haemophilus influenzae NOT DETECTED NOT DETECTED Final   Neisseria meningitidis NOT DETECTED NOT DETECTED Final   Pseudomonas aeruginosa NOT DETECTED NOT DETECTED Final   Candida albicans NOT DETECTED NOT DETECTED Final   Candida glabrata NOT DETECTED NOT DETECTED Final   Candida krusei NOT  DETECTED NOT DETECTED Final   Candida parapsilosis NOT DETECTED NOT DETECTED Final   Candida tropicalis NOT DETECTED NOT DETECTED Final    Comment: Performed at [facility], [address] [place], [place] [address]  Culture, Urine     Status: None   Collection Time: 05/10/19  7:13 PM   Specimen: Urine, Catheterized  Result Value Ref Range Status   Specimen Description URINE, CATHETERIZED  Final   Special Requests NONE  Final   Culture   Final    NO GROWTH Performed at [facility], [address] [place], [place] [address]    Report Status 05/11/2019 FINAL  Final  Culture, blood (routine x 2)     Status: Abnormal   Collection Time: 05/11/19 12:35 PM   Specimen: BLOOD  Result Value Ref Range Status   Specimen Description BLOOD RIGHT ANTECUBITAL  Final   Special Requests   Final    BOTTLES DRAWN AEROBIC ONLY Blood Culture results may not be optimal due to an inadequate volume of blood received in culture bottles   Culture  Setup Time   Final    GRAM POSITIVE COCCI AEROBIC BOTTLE ONLY CRITICAL RESULT CALLED TO, READ BACK BY AND VERIFIED WITH: C. WALSTON PHARMD, AT 1443 05/12/19 BY D. VANHOOK    Culture (A)  Final    STAPHYLOCOCCUS SPECIES (COAGULASE NEGATIVE) THE SIGNIFICANCE OF ISOLATING THIS ORGANISM FROM A SINGLE SET OF BLOOD CULTURES WHEN MULTIPLE SETS ARE DRAWN IS UNCERTAIN. PLEASE NOTIFY THE MICROBIOLOGY DEPARTMENT WITHIN ONE WEEK IF SPECIATION AND SENSITIVITIES ARE REQUIRED. Performed at [facility], [address] [place], [place] [address]    Report Status 05/13/2019 FINAL  Final  Culture, blood (routine x 2)     Status: None   Collection Time: 05/11/19 12:45 PM   Specimen: BLOOD RIGHT HAND  Result Value Ref Range Status   Specimen Description BLOOD RIGHT HAND  Final   Special Requests   Final    BOTTLES DRAWN AEROBIC  ONLY Blood Culture results may not be optimal due to an inadequate volume of blood received in culture bottles   Culture   Final     NO GROWTH 5 DAYS Performed at [facility], [address] [place], [place] [address]    Report Status 05/16/2019 FINAL  Final     Radiology Studies: Dg Abd 1 View  Result Date: 05/15/2019 CLINICAL DATA:  Evaluate for constipation. EXAM: ABDOMEN - 1 VIEW COMPARISON:  CT abdomen pelvis 04/11/2018. FINDINGS: Single AP supine radiograph of the abdomen through the pubic symphysis. The lung bases are excluded from field of view. Gastric distention. There are no dilated loops of bowel to suggest obstruction. Mild stool burden. No supine evidence for free air. Status post right hip arthroplasty, incompletely visualized. IMPRESSION: Gastric distention. Nonobstructive bowel gas pattern. Mild stool burden. Electronically Signed   By: Emmaline Kluver M.D.   On: 05/15/2019 18:25   Dg Chest Port 1 View  Result Date: 05/15/2019 CLINICAL DATA:  68 year old male with a history of PICC placement EXAM: PORTABLE CHEST 1 VIEW COMPARISON:  None. FINDINGS: Cardiomediastinal silhouette unchanged in size and contour with surgical changes of median sternotomy and cardiomegaly. No pneumothorax. No pleural effusion. Coarsened interstitial markings throughout. Right upper extremity PICC is redundant, without visualization of the point of termination of the tip. Redemonstration of partially healed right-sided rib fractures IMPRESSION: Right upper extremity PICC is redundant within the veins in the tip is not visualized. Repositioning is indicated. Similar appearance of the lungs with diffuse reticulonodular opacities. Similar appearance of surgical changes of median sternotomy and cardiomegaly. Electronically Signed   By: [name] D.O.   On: 05/15/2019 11:13    Scheduled Meds: . carvedilol  3.125 mg Oral BID  . Chlorhexidine Gluconate Cloth  6 each Topical Daily  . docusate sodium  100 mg Oral Daily  . fluticasone  2 spray Each Nare Daily  . fluticasone furoate-vilanterol  1 puff Inhalation Daily  . insulin  aspart  0-5 Units Subcutaneous QHS  . insulin aspart  0-9 Units Subcutaneous TID WC  . insulin aspart  7 Units Subcutaneous TID WC  . insulin glargine  42 Units Subcutaneous Daily  . loratadine  10 mg Oral QPM  . multivitamin with minerals  1 tablet Oral Daily  . pantoprazole  40 mg Oral Daily  . polyethylene glycol  17 g Oral Daily  . Ensure Max Protein  11 oz Oral BID  . senna-docusate  2 tablet Oral BID  . umeclidinium bromide  1 puff Inhalation Daily  . vitamin C  500 mg Oral Daily  . zinc sulfate  220 mg Oral Daily   Continuous Infusions: . heparin 2,000 Units/hr (05/16/19 0839)  . penicillin g continuous IV infusion 12 Million Units (05/16/19 1603)     LOS: 10 days   Rickey Barbara, MD [facility] Pager On Amion  If 7PM-7AM, please contact night-coverage 05/16/2019, 4:39 PM

## 2019-05-16 NOTE — Progress Notes (Signed)
ANTICOAGULATION CONSULT NOTE - Follow Up Consult  Pharmacy Consult for Heparin Indication: atrial fibrillation  Allergies  Allergen Reactions  . Lisinopril Cough  . Daptomycin Rash  . Metformin And Related Diarrhea  . Tape Rash and Other (See Comments)    Adhesive Tape-Burn skin.    Patient Measurements: Height: 5\' 9"  (175.3 cm) Weight: 267 lb (121.1 kg) IBW/kg (Calculated) : 70.7 Heparin Dosing Weight: 98 kg  Vital Signs: Temp: 97.3 F (36.3 C) (11/08 0847) Temp Source: Axillary (11/08 0847) BP: 105/61 (11/08 0847) Pulse Rate: 76 (11/08 0500)  Labs: Recent Labs    05/14/19 0505 05/15/19 0334 05/16/19 0354  HGB 13.3 13.1 13.6  HCT 42.2 40.8 41.3  PLT 101* 83* 99*  HEPARINUNFRC 0.38 0.31 0.66  CREATININE  --  0.80  --     Estimated Creatinine Clearance: 113.6 mL/min (by C-G formula based on SCr of 0.8 mg/dL).   Assessment:   Anticoag: Xarelto PTA for Afib, LD 10/28 AM, d-dimer >20 >> switch to Heparin infusion HL 0.66 in goal, Hgb/Hct wnl, plts 99 stable  Goal of Therapy:  Heparin level 0.3-0.7 units/ml Monitor platelets by anticoagulation protocol: Yes   Plan:  - Continue Heparin at 2000 units/hr - Daily HL, CBC   Rober Skeels S. Alford [place], PharmD, [place] Clinical Staff Pharmacist Eilene Ghazi Stillinger 05/16/2019,8:51 AM

## 2019-05-17 ENCOUNTER — Inpatient Hospital Stay (HOSPITAL_COMMUNITY): Payer: Medicare Other

## 2019-05-17 DIAGNOSIS — J9611 Chronic respiratory failure with hypoxia: Secondary | ICD-10-CM | POA: Diagnosis not present

## 2019-05-17 DIAGNOSIS — U071 COVID-19: Secondary | ICD-10-CM | POA: Diagnosis not present

## 2019-05-17 DIAGNOSIS — R7881 Bacteremia: Secondary | ICD-10-CM | POA: Diagnosis not present

## 2019-05-17 DIAGNOSIS — S32009A Unspecified fracture of unspecified lumbar vertebra, initial encounter for closed fracture: Secondary | ICD-10-CM | POA: Diagnosis not present

## 2019-05-17 LAB — COMPREHENSIVE METABOLIC PANEL
ALT: 16 U/L (ref 0–44)
AST: 20 U/L (ref 15–41)
Albumin: 1.9 g/dL — ABNORMAL LOW (ref 3.5–5.0)
Alkaline Phosphatase: 62 U/L (ref 38–126)
Anion gap: 8 (ref 5–15)
BUN: 14 mg/dL (ref 8–23)
CO2: 25 mmol/L (ref 22–32)
Calcium: 8.1 mg/dL — ABNORMAL LOW (ref 8.9–10.3)
Chloride: 96 mmol/L — ABNORMAL LOW (ref 98–111)
Creatinine, Ser: 0.79 mg/dL (ref 0.61–1.24)
GFR calc Af Amer: >60 mL/min (ref >60)
GFR calc non Af Amer: >60 mL/min (ref >60)
Glucose, Bld: 194 mg/dL — ABNORMAL HIGH (ref 70–99)
Potassium: 4.6 mmol/L (ref 3.5–5.1)
Sodium: 129 mmol/L — ABNORMAL LOW (ref 135–145)
Total Bilirubin: 1.1 mg/dL (ref 0.3–1.2)
Total Protein: 5.3 g/dL — ABNORMAL LOW (ref 6.5–8.1)

## 2019-05-17 LAB — CBC
HCT: 38.4 % — ABNORMAL LOW (ref 39.0–52.0)
Hemoglobin: 12.4 g/dL — ABNORMAL LOW (ref 13.0–17.0)
MCH: 27.3 pg (ref 26.0–34.0)
MCHC: 32.3 g/dL (ref 30.0–36.0)
MCV: 84.6 fL (ref 80.0–100.0)
Platelets: 114 10*3/uL — ABNORMAL LOW (ref 150–400)
RBC: 4.54 MIL/uL (ref 4.22–5.81)
RDW: 15.1 % (ref 11.5–15.5)
WBC: 13.5 10*3/uL — ABNORMAL HIGH (ref 4.0–10.5)
nRBC: 0 % (ref 0.0–0.2)

## 2019-05-17 LAB — HEPARIN LEVEL (UNFRACTIONATED): Heparin Unfractionated: 0.7 IU/mL (ref 0.30–0.70)

## 2019-05-17 LAB — C-REACTIVE PROTEIN: CRP: 5.4 mg/dL — ABNORMAL HIGH (ref <1.0)

## 2019-05-17 LAB — GLUCOSE, CAPILLARY
Glucose-Capillary: 181 mg/dL — ABNORMAL HIGH (ref 70–99)
Glucose-Capillary: 158 mg/dL — ABNORMAL HIGH (ref 70–99)
Glucose-Capillary: 112 mg/dL — ABNORMAL HIGH (ref 70–99)

## 2019-05-17 LAB — FERRITIN: Ferritin: 186 ng/mL (ref 24–336)

## 2019-05-17 MED ORDER — HEPARIN SOD (PORK) LOCK FLUSH 100 UNIT/ML IV SOLN
INTRAVENOUS | Status: AC
  Filled 2019-05-17: qty 5

## 2019-05-17 MED ORDER — LIDOCAINE HCL 1 % IJ SOLN
INTRAMUSCULAR | Status: AC
  Filled 2019-05-17: qty 20

## 2019-05-17 MED ORDER — LIDOCAINE HCL (PF) 1 % IJ SOLN
INTRAMUSCULAR | Status: AC | PRN
  Administered 2019-05-17: 10 mL

## 2019-05-17 NOTE — Procedures (Signed)
IV ABX ACCESS  S/p RUE DL POWER PICC Tip svcra No comp Stable ebl min Ready for use

## 2019-05-17 NOTE — Progress Notes (Addendum)
[facility]   Reason for visit: Follow up on bacteremia  Interval History: repeat blood cultures with no further growth of Strep.  1 bottle with CoNS.  Afebrile since 11/2 at the time of his bacteremia.    Physical Exam: Constitutional:  Vitals:   05/17/19 0300 05/17/19 0750  BP: 129/77 90/62  Pulse: 68 76  Resp:    Temp: (!) 97.1 F (36.2 C) 98.4 F (36.9 C)  SpO2: 97% 93%  Due to COVID, the patient is not examined directly.     Lab Results  Component Value Date   WBC 13.5 (H) 05/17/2019   HGB 12.4 (L) 05/17/2019   HCT 38.4 (L) 05/17/2019   MCV 84.6 05/17/2019   PLT 114 (L) 05/17/2019    Lab Results  Component Value Date   CREATININE 0.79 05/17/2019   BUN 14 05/17/2019   NA 129 (L) 05/17/2019   K 4.6 05/17/2019   CL 96 (L) 05/17/2019   CO2 25 05/17/2019    Lab Results  Component Value Date   ALT 16 05/17/2019   AST 20 05/17/2019   ALKPHOS 62 05/17/2019     Microbiology: Recent Results (from the past 240 hour(s))  Culture, blood (routine x 2)     Status: Abnormal   Collection Time: 05/10/19  4:26 PM   Specimen: BLOOD LEFT HAND  Result Value Ref Range Status   Specimen Description BLOOD LEFT HAND  Final   Special Requests   Final    BOTTLES DRAWN AEROBIC ONLY Blood Culture adequate volume   Culture  Setup Time   Final    GRAM POSITIVE COCCI AEROBIC BOTTLE ONLY CRITICAL VALUE NOTED.  VALUE IS CONSISTENT WITH PREVIOUSLY REPORTED AND CALLED VALUE. Performed at [facility], [address] [place], [place] [address]    Culture STREPTOCOCCUS GROUP G (A)  Final   Report Status 05/13/2019 FINAL  Final   Organism ID, Bacteria STREPTOCOCCUS GROUP G  Final      Susceptibility   Streptococcus group g - MIC*    CLINDAMYCIN <=0.25 SENSITIVE Sensitive     AMPICILLIN <=0.25 SENSITIVE Sensitive     ERYTHROMYCIN <=0.12 SENSITIVE Sensitive     VANCOMYCIN 0.25 SENSITIVE Sensitive     CEFTRIAXONE <=0.12 SENSITIVE Sensitive    LEVOFLOXACIN 0.5 SENSITIVE Sensitive     PENICILLIN Value in next row Sensitive      SENSITIVE<=0.06    * STREPTOCOCCUS GROUP G  Culture, blood (routine x 2)     Status: Abnormal   Collection Time: 05/10/19  4:26 PM   Specimen: BLOOD LEFT HAND  Result Value Ref Range Status   Specimen Description BLOOD LEFT HAND  Final   Special Requests   Final    BOTTLES DRAWN AEROBIC ONLY Blood Culture adequate volume   Culture  Setup Time   Final    GRAM POSITIVE COCCI AEROBIC BOTTLE ONLY CRITICAL RESULT CALLED TO, READ BACK BY AND VERIFIED WITH: Peter Minium PharmD 7:15 05/11/19 ([email or web address])    Culture (A)  Final    STREPTOCOCCUS GROUP G SUSCEPTIBILITIES PERFORMED ON PREVIOUS CULTURE WITHIN THE LAST 5 DAYS. Performed at [facility], [address], [place], [place] [address]    Report Status 05/13/2019 FINAL  Final  Blood Culture ID Panel (Reflexed)     Status: Abnormal   Collection Time: 05/10/19  4:26 PM  Result Value Ref Range Status   Enterococcus species NOT DETECTED NOT DETECTED Final   Listeria monocytogenes  NOT DETECTED NOT DETECTED Final   Staphylococcus species NOT DETECTED NOT DETECTED Final   Staphylococcus aureus (BCID) NOT DETECTED NOT DETECTED Final   Streptococcus species DETECTED (A) NOT DETECTED Final    Comment: Not Enterococcus species, Streptococcus agalactiae, Streptococcus pyogenes, or Streptococcus pneumoniae. CRITICAL RESULT CALLED TO, READ BACK BY AND VERIFIED WITH: Peter Minium PharmD 7:15 05/11/19 ([email or web address])    Streptococcus agalactiae NOT DETECTED NOT DETECTED Final   Streptococcus pneumoniae NOT DETECTED NOT DETECTED Final   Streptococcus pyogenes NOT DETECTED NOT DETECTED Final   Acinetobacter baumannii NOT DETECTED NOT DETECTED Final   Enterobacteriaceae species NOT DETECTED NOT DETECTED Final   Enterobacter cloacae complex NOT DETECTED NOT DETECTED Final   Escherichia coli NOT DETECTED NOT DETECTED Final   Klebsiella oxytoca NOT DETECTED NOT DETECTED Final    Klebsiella pneumoniae NOT DETECTED NOT DETECTED Final   Proteus species NOT DETECTED NOT DETECTED Final   Serratia marcescens NOT DETECTED NOT DETECTED Final   Haemophilus influenzae NOT DETECTED NOT DETECTED Final   Neisseria meningitidis NOT DETECTED NOT DETECTED Final   Pseudomonas aeruginosa NOT DETECTED NOT DETECTED Final   Candida albicans NOT DETECTED NOT DETECTED Final   Candida glabrata NOT DETECTED NOT DETECTED Final   Candida krusei NOT DETECTED NOT DETECTED Final   Candida parapsilosis NOT DETECTED NOT DETECTED Final   Candida tropicalis NOT DETECTED NOT DETECTED Final    Comment: Performed at [facility], [address], [place], [place] [address]  Culture, Urine     Status: None   Collection Time: 05/10/19  7:13 PM   Specimen: Urine, Catheterized  Result Value Ref Range Status   Specimen Description URINE, CATHETERIZED  Final   Special Requests NONE  Final   Culture   Final    NO GROWTH Performed at [facility], [address], [place], [place] [address]    Report Status 05/11/2019 FINAL  Final  Culture, blood (routine x 2)     Status: Abnormal   Collection Time: 05/11/19 12:35 PM   Specimen: BLOOD  Result Value Ref Range Status   Specimen Description BLOOD RIGHT ANTECUBITAL  Final   Special Requests   Final    BOTTLES DRAWN AEROBIC ONLY Blood Culture results may not be optimal due to an inadequate volume of blood received in culture bottles   Culture  Setup Time   Final    GRAM POSITIVE COCCI AEROBIC BOTTLE ONLY CRITICAL RESULT CALLED TO, READ BACK BY AND VERIFIED WITH: C. WALSTON PHARMD, AT 1443 05/12/19 BY D. VANHOOK    Culture (A)  Final    STAPHYLOCOCCUS SPECIES (COAGULASE NEGATIVE) THE SIGNIFICANCE OF ISOLATING THIS ORGANISM FROM A SINGLE SET OF BLOOD CULTURES WHEN MULTIPLE SETS ARE DRAWN IS UNCERTAIN. PLEASE NOTIFY THE MICROBIOLOGY DEPARTMENT WITHIN ONE WEEK IF SPECIATION AND SENSITIVITIES ARE REQUIRED. Performed at [facility], [address] [place], [place] [address]    Report Status 05/13/2019 FINAL  Final  Culture, blood (routine x 2)     Status: None   Collection Time: 05/11/19 12:45 PM   Specimen: BLOOD RIGHT HAND  Result Value Ref Range Status   Specimen Description BLOOD RIGHT HAND  Final   Special Requests   Final    BOTTLES DRAWN AEROBIC ONLY Blood Culture results may not be optimal due to an inadequate volume of blood received in culture bottles   Culture   Final    NO GROWTH 5 DAYS Performed at [facility], [address] [place], [place] [address]    Report Status 05/16/2019 FINAL  Final    Impression/Plan:  1. COVID-19 positive test.  This was positive on 10/29 though patient has been asymptomatic with no cough, no opacities on CXR, no fever at the time of presentation.  He did develop fever later and secondary to bacteremia.  At this time, he has remained asymptomatic from [place] so isolation can be safely removed since it is > 10 days and more than one week since his fever (due to another cause).  His CXR is c/w vascular congestion, not infection.  2.  Bacteremia - on antibiotics as above.  Need TEE to determine duration and timing of back surgery.    3.  Fractured back - followed by neurosurgery and awaiting clearance of infection.   4. Isolation - as in #1, no further indication for isolation.  Will stop now.

## 2019-05-17 NOTE — Progress Notes (Signed)
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 Patient fell onto his buttock. Due to significant pain, pt was brought to the ER. Denies any incontinence of urine or bowel. In the ED[facility], CT showed unstable lumbar fracture and patient was [facility]. Patient had Covid test done which was positive. Patient reports some productive cough. CXR showing vascular congestion, otherwise unremarkable.  Other labs show BNP of 326 LDH 331 CRP 14.7 WBC 11.9 hemoglobin 13.8 platelets 104 creatinine 1.3. Neurosurgery has been consulted. Patient is being admitted for further management of lumbar fracture.   Assessment & Plan:   Active Problems:   OSA (obstructive sleep apnea)   Type II diabetes mellitus with manifestations (HCC)   Chronic systolic CHF (congestive heart failure) (HCC)   Hyperlipidemia with target LDL less than 70   Chronic atrial fibrillation   Chronic respiratory failure with hypoxia (HCC)   Unstable burst fracture of unspecified lumbar vertebra, initial encounter for closed fracture (HCC)   Closed unstable burst fracture of lumbar vertebra (HCC)   Closed unstable burst fracture of first lumbar vertebra (HCC)   Pressure injury of skin  Unstable L1-L2 fracture -Neurosurgery on board, surgery currently on hold given infectious w/u -Cardiology was consulted earlier, cleared patient for surgery, signed off.   -Reconsulted cardiology on 05/10/19 for significant PVCs, bigeminy -Dr. Sharolyn DouglasEzenduka spoke to neurosurgery Dr. Conchita ParisNundkumar on 05/10/2019, who was in agreement with starting patient on heparin drip and to closely monitor for any neuro  change (risk of hematoma in pt with spinal fracture), plans to postpone surgery due to bacteremia -Recently discussed with Neurosurgery, recommendation to remain on strict bed rest, remain flat -Neurosurgery anticipating surgery after below infectious work up is complete  Bacteremia 2/2 GBS -Remains afebrile at this time -CRP elevated -Blood cx pos for GBS. In setting of prosthetic mitral valve, recommendation for f/u TEE, remains pending at this time -Of note, 1/2 coag neg staph on repeat blood culture. Discussed with ID. Likely contaminant. Stable. -Have contacted Cardiology regarding TEE. Was originally planned for 11/6, however was cancelled secondary to covid status. Pt now off restrictions per ID recommendations  COVID-19 viral infection -Currently desaturates while sleeping, has a history of OSA, cannot use CPAP due to Covid infection -Afebrile -D-dimer greater than 20 on admission (??Hx of ankylosing spondylitis) -Patient with high risk of thromboembolic events, [facility] -Lower extremity Doppler negative for DVT -Chest x-ray showing vascular congestion -Completed course of decadron on 11/7 and completed remdesivir on 05/10/19 -CRP had trended down  AKI -Cr peaked to 1.63 from normal baseline -Likely 2/2 bacteremia vs pt on diuretics -Holding torsemide for now  -Will avoid IVF as pt currently has CHF and Covid -Renal function has normalized -Continue to follow renal panel  Thrombocytopenia -Suspect from COVID-19 infection -Plts now rising  Chronic A. Fib -Remains rate controlled -Continue Coreg -Continuing to hold Xarelto and continue on heparin gtt as tolerated  Chronic combined systolic and diastolic HF/history of bioprosthetic MVR/multiple PVCs noted on telemetry -Appears compensated -BNP 326 -Last EF was 40 to 45% in November 2019 -Cardiology reportedly reconsulted for multiple PVCs/bigeminy -Monitor electrolytes potassium, magnesium,  replace as needed -Continued on  Coreg as tolerated  Diabetes mellitus type 2 -Continue SSI, Accu-Cheks, hypoglycemic protocol -currently on lantus at 45 units with meal coverage at 8 units -Glucose trends seem to be stable  Hyperlipidemia -Continue statin as pt tolerates  Constipation -Pt noted to have vomited recently with no documented bowel movement in several days in setting of narcotics for analgesia -personally reviewed abd xray. Stool seen in colon -Very good results with lactulose and dulcolax suppository   DVT prophylaxis: heparin gtt Code Status: Full Family Communication: Pt in room, family not at bedside Disposition Plan: Uncertain at this time  Consultants:   Neurosurgery  ID  Cardiology for TEE  Procedures:     Antimicrobials: Anti-infectives (From admission, onward)   Start     Dose/Rate Route Frequency Ordered Stop   05/15/19 1430  penicillin G potassium 12 Million Units in dextrose 5 % 500 mL continuous infusion     12 Million Units 41.7 mL/hr over 12 Hours Intravenous Every 12 hours 05/15/19 1343     05/13/19 1000  penicillin G potassium 12 Million Units in dextrose 5 % 500 mL continuous infusion  Status:  Discontinued     12 Million Units 41.7 mL/hr over 12 Hours Intravenous Every 12 hours 05/12/19 1159 05/15/19 1343   05/11/19 0730  cefTRIAXone (ROCEPHIN) 2 g in sodium chloride 0.9 % 100 mL IVPB  Status:  Discontinued     2 g 200 mL/hr over 30 Minutes Intravenous Every 24 hours 05/11/19 0721 05/12/19 1159   05/08/19 0200  remdesivir 100 mg in sodium chloride 0.9 % 250 mL IVPB     100 mg 500 mL/hr over 30 Minutes Intravenous Every 24 hours 05/07/19 0059 05/11/19 0513   05/07/19 0100  remdesivir 200 mg in sodium chloride 0.9 % 250 mL IVPB     200 mg 500 mL/hr over 30 Minutes Intravenous Once 05/07/19 0057 05/07/19 0512      Subjective: Without complaints this AM  Objective: Vitals:   05/17/19 0750 05/17/19 0810 05/17/19 0900 05/17/19  1230  BP: 90/62 110/80 130/80 (!) 149/90  Pulse: 76   85  Resp:      Temp: 98.4 F (36.9 C)   98.7 F (37.1 C)  TempSrc: Axillary   Axillary  SpO2: 93%     Weight:      Height:        Intake/Output Summary (Last 24 hours) at 05/17/2019 1435 Last data filed at 05/17/2019 1230 Gross per 24 hour  Intake 2038.88 ml  Output 750 ml  Net 1288.88 ml   Filed Weights   05/06/19 1457  Weight: 121.1 kg    Examination: General exam: Conversant, in no acute distress Respiratory system: normal chest rise, clear, no audible wheezing Cardiovascular system: regular rhythm, s1-s2 Gastrointestinal system: Nondistended, nontender, pos BS Central nervous system: No seizures, no tremors Extremities: No cyanosis, no joint deformities Skin: No rashes, no pallor Psychiatry: Affect normal // no auditory hallucinations   Data Reviewed: I have personally reviewed following labs and imaging studies  CBC: Recent Labs  Lab 05/11/19 0332 05/12/19 0344 05/13/19 0402 05/14/19 0505 05/15/19 0334 05/16/19 0354 05/17/19 0521  WBC 20.9* 11.3* 9.9 12.9* 9.0 12.8* 13.5*  NEUTROABS 19.1* 10.4*  --   --   --   --   --   HGB 12.3* 12.5* 12.9* 13.3 13.1 13.6 12.4*  HCT 39.4 39.4 41.6 42.2 40.8 41.3 38.4*  MCV 87.0 87.2 88.3 87.7 85.9 84.8 84.6  PLT 127* 119* 105* 101* 83* 99*  932*   Basic Metabolic Panel: Recent Labs  Lab 05/11/19 0332 05/12/19 0344 05/15/19 0334 05/17/19 0521  NA 136 141 132* 129*  K 4.2 4.5 5.3* 4.6  CL 97* 102 99 96*  CO2 26 29 25 25   GLUCOSE 171* 298* 283* 194*  BUN 47* 50* 21 14  CREATININE 1.63* 1.22 0.80 0.79  CALCIUM 8.3* 8.3* 8.3* 8.1*   GFR: Estimated Creatinine Clearance: 113.6 mL/min (by C-G formula based on SCr of 0.79 mg/dL). Liver Function Tests: Recent Labs  Lab 05/11/19 0332 05/12/19 0344 05/15/19 0334 05/17/19 0521  AST 22 16 15 20   ALT 17 17 13 16   ALKPHOS 64 69 64 62  BILITOT 1.9* 1.2 1.6* 1.1  PROT 5.6* 5.7* 5.6* 5.3*  ALBUMIN 2.3* 2.2* 2.0*  1.9*   No results for input(s): LIPASE, AMYLASE in the last 168 hours. No results for input(s): AMMONIA in the last 168 hours. Coagulation Profile: No results for input(s): INR, PROTIME in the last 168 hours. Cardiac Enzymes: No results for input(s): CKTOTAL, CKMB, CKMBINDEX, TROPONINI in the last 168 hours. BNP (last 3 results) No results for input(s): PROBNP in the last 8760 hours. HbA1C: No results for input(s): HGBA1C in the last 72 hours. CBG: Recent Labs  Lab 05/16/19 0836 05/16/19 1159 05/16/19 1649 05/16/19 2136 05/17/19 0752  GLUCAP 267* 232* 182* 158* 181*   Lipid Profile: No results for input(s): CHOL, HDL, LDLCALC, TRIG, CHOLHDL, LDLDIRECT in the last 72 hours. Thyroid Function Tests: No results for input(s): TSH, T4TOTAL, FREET4, T3FREE, THYROIDAB in the last 72 hours. Anemia Panel: Recent Labs    05/16/19 0354 05/17/19 0521  FERRITIN 217 186   Sepsis Labs: Recent Labs  Lab 05/10/19 1626 05/10/19 1911 05/11/19 0332 05/12/19 0344  PROCALCITON  --   --  31.39 16.18  LATICACIDVEN 2.3* 3.5*  --   --     Recent Results (from the past 240 hour(s))  Culture, blood (routine x 2)     Status: Abnormal   Collection Time: 05/10/19  4:26 PM   Specimen: BLOOD LEFT HAND  Result Value Ref Range Status   Specimen Description BLOOD LEFT HAND  Final   Special Requests   Final    BOTTLES DRAWN AEROBIC ONLY Blood Culture adequate volume   Culture  Setup Time   Final    GRAM POSITIVE COCCI AEROBIC BOTTLE ONLY CRITICAL VALUE NOTED.  VALUE IS CONSISTENT WITH PREVIOUSLY REPORTED AND CALLED VALUE. Performed at [facility], [place] [address], [place], [place] [address]    Culture STREPTOCOCCUS GROUP G (A)  Final   Report Status 05/13/2019 FINAL  Final   Organism ID, Bacteria STREPTOCOCCUS GROUP G  Final      Susceptibility   Streptococcus group g - MIC*    CLINDAMYCIN <=0.25 SENSITIVE Sensitive     AMPICILLIN <=0.25 SENSITIVE Sensitive     ERYTHROMYCIN <=0.12  SENSITIVE Sensitive     VANCOMYCIN 0.25 SENSITIVE Sensitive     CEFTRIAXONE <=0.12 SENSITIVE Sensitive     LEVOFLOXACIN 0.5 SENSITIVE Sensitive     PENICILLIN Value in next row Sensitive      SENSITIVE<=0.06    * STREPTOCOCCUS GROUP G  Culture, blood (routine x 2)     Status: Abnormal   Collection Time: 05/10/19  4:26 PM   Specimen: BLOOD LEFT HAND  Result Value Ref Range Status   Specimen Description BLOOD LEFT HAND  Final   Special Requests   Final    BOTTLES DRAWN AEROBIC ONLY  Blood Culture adequate volume   Culture  Setup Time   Final    GRAM POSITIVE COCCI AEROBIC BOTTLE ONLY CRITICAL RESULT CALLED TO, READ BACK BY AND VERIFIED WITH: Peter Minium PharmD 7:15 05/11/19 ([email or web address])    Culture (A)  Final    STREPTOCOCCUS GROUP G SUSCEPTIBILITIES PERFORMED ON PREVIOUS CULTURE WITHIN THE LAST 5 DAYS. Performed at [facility], [address], [place], [place] [address]    Report Status 05/13/2019 FINAL  Final  Blood Culture ID Panel (Reflexed)     Status: Abnormal   Collection Time: 05/10/19  4:26 PM  Result Value Ref Range Status   Enterococcus species NOT DETECTED NOT DETECTED Final   Listeria monocytogenes NOT DETECTED NOT DETECTED Final   Staphylococcus species NOT DETECTED NOT DETECTED Final   Staphylococcus aureus (BCID) NOT DETECTED NOT DETECTED Final   Streptococcus species DETECTED (A) NOT DETECTED Final    Comment: Not Enterococcus species, Streptococcus agalactiae, Streptococcus pyogenes, or Streptococcus pneumoniae. CRITICAL RESULT CALLED TO, READ BACK BY AND VERIFIED WITH: Peter Minium PharmD 7:15 05/11/19 ([email or web address])    Streptococcus agalactiae NOT DETECTED NOT DETECTED Final   Streptococcus pneumoniae NOT DETECTED NOT DETECTED Final   Streptococcus pyogenes NOT DETECTED NOT DETECTED Final   Acinetobacter baumannii NOT DETECTED NOT DETECTED Final   Enterobacteriaceae species NOT DETECTED NOT DETECTED Final   Enterobacter cloacae complex NOT DETECTED NOT DETECTED Final    Escherichia coli NOT DETECTED NOT DETECTED Final   Klebsiella oxytoca NOT DETECTED NOT DETECTED Final   Klebsiella pneumoniae NOT DETECTED NOT DETECTED Final   Proteus species NOT DETECTED NOT DETECTED Final   Serratia marcescens NOT DETECTED NOT DETECTED Final   Haemophilus influenzae NOT DETECTED NOT DETECTED Final   Neisseria meningitidis NOT DETECTED NOT DETECTED Final   Pseudomonas aeruginosa NOT DETECTED NOT DETECTED Final   Candida albicans NOT DETECTED NOT DETECTED Final   Candida glabrata NOT DETECTED NOT DETECTED Final   Candida krusei NOT DETECTED NOT DETECTED Final   Candida parapsilosis NOT DETECTED NOT DETECTED Final   Candida tropicalis NOT DETECTED NOT DETECTED Final    Comment: Performed at [facility], [address] [place], [place] [address]  Culture, Urine     Status: None   Collection Time: 05/10/19  7:13 PM   Specimen: Urine, Catheterized  Result Value Ref Range Status   Specimen Description URINE, CATHETERIZED  Final   Special Requests NONE  Final   Culture   Final    NO GROWTH Performed at [facility], [address], [place], [place] [address]    Report Status 05/11/2019 FINAL  Final  Culture, blood (routine x 2)     Status: Abnormal   Collection Time: 05/11/19 12:35 PM   Specimen: BLOOD  Result Value Ref Range Status   Specimen Description BLOOD RIGHT ANTECUBITAL  Final   Special Requests   Final    BOTTLES DRAWN AEROBIC ONLY Blood Culture results may not be optimal due to an inadequate volume of blood received in culture bottles   Culture  Setup Time   Final    GRAM POSITIVE COCCI AEROBIC BOTTLE ONLY CRITICAL RESULT CALLED TO, READ BACK BY AND VERIFIED WITH: C. WALSTON PHARMD, AT 1443 05/12/19 BY D. VANHOOK    Culture (A)  Final    STAPHYLOCOCCUS SPECIES (COAGULASE NEGATIVE) THE SIGNIFICANCE OF ISOLATING THIS ORGANISM FROM A SINGLE SET OF BLOOD CULTURES WHEN MULTIPLE SETS ARE DRAWN IS UNCERTAIN. PLEASE NOTIFY THE MICROBIOLOGY  DEPARTMENT WITHIN  ONE WEEK IF SPECIATION AND SENSITIVITIES ARE REQUIRED. Performed at [facility], [address] [place], [place] [address]    Report Status 05/13/2019 FINAL  Final  Culture, blood (routine x 2)     Status: None   Collection Time: 05/11/19 12:45 PM   Specimen: BLOOD RIGHT HAND  Result Value Ref Range Status   Specimen Description BLOOD RIGHT HAND  Final   Special Requests   Final    BOTTLES DRAWN AEROBIC ONLY Blood Culture results may not be optimal due to an inadequate volume of blood received in culture bottles   Culture   Final    NO GROWTH 5 DAYS Performed at [facility], [address] [place], [place] [address]    Report Status 05/16/2019 FINAL  Final     Radiology Studies: Dg Abd 1 View  Result Date: 05/15/2019 CLINICAL DATA:  Evaluate for constipation. EXAM: ABDOMEN - 1 VIEW COMPARISON:  CT abdomen pelvis 04/11/2018. FINDINGS: Single AP supine radiograph of the abdomen through the pubic symphysis. The lung bases are excluded from field of view. Gastric distention. There are no dilated loops of bowel to suggest obstruction. Mild stool burden. No supine evidence for free air. Status post right hip arthroplasty, incompletely visualized. IMPRESSION: Gastric distention. Nonobstructive bowel gas pattern. Mild stool burden. Electronically Signed   By: Emmaline KluverNancy  Ballantyne M.D.   On: 05/15/2019 18:25    Scheduled Meds: . carvedilol  3.125 mg Oral BID  . Chlorhexidine Gluconate Cloth  6 each Topical Daily  . docusate sodium  100 mg Oral Daily  . fluticasone  2 spray Each Nare Daily  . fluticasone furoate-vilanterol  1 puff Inhalation Daily  . insulin aspart  0-5 Units Subcutaneous QHS  . insulin aspart  0-9 Units Subcutaneous TID WC  . insulin aspart  8 Units Subcutaneous TID WC  . insulin glargine  45 Units Subcutaneous Daily  . loratadine  10 mg Oral QPM  . multivitamin with minerals  1 tablet Oral Daily  . pantoprazole  40 mg Oral Daily  .  polyethylene glycol  17 g Oral Daily  . Ensure Max Protein  11 oz Oral BID  . senna-docusate  2 tablet Oral BID  . umeclidinium bromide  1 puff Inhalation Daily  . vitamin C  500 mg Oral Daily  . zinc sulfate  220 mg Oral Daily   Continuous Infusions: . heparin 2,000 Units/hr (05/17/19 1230)  . penicillin g continuous IV infusion 12 Million Units (05/17/19 0405)     LOS: 11 days   Rickey BarbaraStephen Sharone Almond, MD [facility] Pager On Amion  If 7PM-7AM, please contact night-coverage 05/17/2019, 2:35 PM

## 2019-05-17 NOTE — Progress Notes (Signed)
ANTICOAGULATION CONSULT NOTE - Follow Up Consult  Pharmacy Consult for Heparin Indication: atrial fibrillation  Allergies  Allergen Reactions  . Lisinopril Cough  . Daptomycin Rash  . Metformin And Related Diarrhea  . Tape Rash and Other (See Comments)    Adhesive Tape-Burn skin.    Patient Measurements: Height: 5\' 9"  (175.3 cm) Weight: 267 lb (121.1 kg) IBW/kg (Calculated) : 70.7 Heparin Dosing Weight: 98 kg  Vital Signs: Temp: 98.4 F (36.9 C) (11/09 0750) Temp Source: Axillary (11/09 0750) BP: 90/62 (11/09 0750) Pulse Rate: 76 (11/09 0750)  Labs: Recent Labs    05/15/19 0334 05/16/19 0354 05/17/19 0521  HGB 13.1 13.6 12.4*  HCT 40.8 41.3 38.4*  PLT 83* 99* 114*  HEPARINUNFRC 0.31 0.66 0.70  CREATININE 0.80  --  0.79    Estimated Creatinine Clearance: 113.6 mL/min (by C-G formula based on SCr of 0.79 mg/dL).   Assessment: 68 year old male on Xarelto prior to admission for atrial fibrillation who presented post fall and was transitioned to Heparin IV due to need for possible lumbar surgery.   Heparin level is therapeutic at 0.70 on current rate of 2000 units/hr.  Hemoglobin is stable at 12.4. Platelets are stable at 114.  No bleeding reported.   Goal of Therapy:  Heparin level 0.3-0.7 units/ml Monitor platelets by anticoagulation protocol: Yes   Plan:  - Continue Heparin at 2000 units/hr - Daily HL, CBC   Sloan Leiter, PharmD, BCPS, BCCCP Clinical Pharmacist Please refer to [facility] for [place] numbers 05/17/2019,9:01 AM

## 2019-05-18 ENCOUNTER — Inpatient Hospital Stay (HOSPITAL_COMMUNITY): Payer: Medicare Other

## 2019-05-18 DIAGNOSIS — I482 Chronic atrial fibrillation, unspecified: Secondary | ICD-10-CM | POA: Diagnosis not present

## 2019-05-18 DIAGNOSIS — R7881 Bacteremia: Secondary | ICD-10-CM | POA: Diagnosis not present

## 2019-05-18 DIAGNOSIS — I5022 Chronic systolic (congestive) heart failure: Secondary | ICD-10-CM | POA: Diagnosis not present

## 2019-05-18 DIAGNOSIS — Z952 Presence of prosthetic heart valve: Secondary | ICD-10-CM | POA: Diagnosis not present

## 2019-05-18 LAB — FERRITIN: Ferritin: 269 ng/mL (ref 24–336)

## 2019-05-18 LAB — GLUCOSE, CAPILLARY
Glucose-Capillary: 205 mg/dL — ABNORMAL HIGH (ref 70–99)
Glucose-Capillary: 167 mg/dL — ABNORMAL HIGH (ref 70–99)
Glucose-Capillary: 178 mg/dL — ABNORMAL HIGH (ref 70–99)
Glucose-Capillary: 177 mg/dL — ABNORMAL HIGH (ref 70–99)

## 2019-05-18 LAB — CBC
HCT: 35.4 % — ABNORMAL LOW (ref 39.0–52.0)
Hemoglobin: 11.3 g/dL — ABNORMAL LOW (ref 13.0–17.0)
MCH: 27.7 pg (ref 26.0–34.0)
MCHC: 31.9 g/dL (ref 30.0–36.0)
MCV: 86.8 fL (ref 80.0–100.0)
Platelets: 123 10*3/uL — ABNORMAL LOW (ref 150–400)
RBC: 4.08 MIL/uL — ABNORMAL LOW (ref 4.22–5.81)
RDW: 15.3 % (ref 11.5–15.5)
WBC: 20.8 10*3/uL — ABNORMAL HIGH (ref 4.0–10.5)
nRBC: 0 % (ref 0.0–0.2)

## 2019-05-18 LAB — C-REACTIVE PROTEIN: CRP: 20 mg/dL — ABNORMAL HIGH (ref <1.0)

## 2019-05-18 LAB — HEPARIN LEVEL (UNFRACTIONATED): Heparin Unfractionated: 0.61 IU/mL (ref 0.30–0.70)

## 2019-05-18 MED ORDER — TORSEMIDE 20 MG PO TABS: 20.0000 mg | ORAL_TABLET | Freq: Every day | ORAL | Status: DC

## 2019-05-18 MED ORDER — FUROSEMIDE 10 MG/ML IJ SOLN
40.0000 mg | Freq: Every day | INTRAMUSCULAR | Status: DC
  Administered 2019-05-18 – 2019-05-20 (×3): 40 mg via INTRAVENOUS
  Filled 2019-05-18 (×3): qty 4

## 2019-05-18 NOTE — Progress Notes (Addendum)
Daily Nursing Note   Received from [facility]. Pt was resting upon first encounter. Right Arm PICC dressing was changed at 0830. Pt c/o of back pain 8/10 was given Tylenol at 0829 and pain decreased to 6/10. Pt was given dilaudid at 1037 for severe pain in the back 8/10. Pt pain decreased as endorsed by pt. Pt had chest xray at bedside at 1130 due to concerns for coughing with ingestion. Pt has refused breakfast and lunch, fluids such as Boost, D.ginger ale and ice water have been encouraged. Pt has vascular congestion, physician ordered lasix. Pt has had good output. All patient needs met throughout the course of the day. Pt pump not responding for intake volume.

## 2019-05-18 NOTE — Care Management Important Message (Signed)
Important Message  Patient Details  Name: Cody Hale MRN: [id number] Date of Birth: [birth year]   Medicare Important Message Given:  Yes  Due to illness patient was not able to sign/ Signed copy left at the patient bedside, IM signed by CMA. Enrique Weiss 05/18/2019, 1:32 PM

## 2019-05-18 NOTE — Progress Notes (Signed)
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 Patient fell onto his buttock. Due to significant pain, pt was brought to the ER. Denies any incontinence of urine or bowel. In the ED, at [facility], CT showed unstable lumbar fracture and patient was transferred to [facility]. Patient had Covid test done which was positive. Patient reports some productive cough. CXR showing vascular congestion, otherwise unremarkable.  Other labs show BNP of 326 LDH 331 CRP 14.7 WBC 11.9 hemoglobin 13.8 platelets 104 creatinine 1.3. Neurosurgery has been consulted. Patient is being admitted for further management of lumbar fracture.   Assessment & Plan:   Active Problems:   OSA (obstructive sleep apnea)   Type II diabetes mellitus with manifestations (HCC)   Chronic systolic CHF (congestive heart failure) (HCC)   Hyperlipidemia with target LDL less than 70   Chronic atrial fibrillation   Chronic respiratory failure with hypoxia (HCC)   Unstable burst fracture of unspecified lumbar vertebra, initial encounter for closed fracture (HCC)   Closed unstable burst fracture of lumbar vertebra (HCC)   Closed unstable burst fracture of first lumbar vertebra (HCC)   Pressure injury of skin  Unstable L1-L2 fracture -Neurosurgery on board, surgery currently on hold given infectious w/u -Cardiology was consulted earlier, cleared patient for surgery, signed off.   -Reconsulted cardiology on 05/10/19 for significant PVCs, bigeminy -Dr. Horris Latino spoke to neurosurgery Dr. Kathyrn Sheriff on 05/10/2019, who was in agreement with starting patient on heparin drip and to closely monitor for any neuro  change (risk of hematoma in pt with spinal fracture), plans to postpone surgery due to bacteremia -Recently discussed with Neurosurgery, recommendation to remain on strict bed rest, remain flat -Neurosurgery anticipating surgery after below infectious work up is complete  Bacteremia 2/2 GBS -Remains afebrile at this time -Blood cx pos for GBS. In setting of prosthetic mitral valve, recommendation for f/u TEE, remains pending at this time -Of note, 1/2 coag neg staph on repeat blood culture. Discussed with ID. Likely contaminant. Stable. -Have contacted Cardiology regarding TEE. Was originally planned for 11/6, however was cancelled secondary to covid status. Pt now off restrictions per ID recommendations -Pending TEE  COVID-19 viral infection -Currently desaturates while sleeping, has a history of OSA, cannot use CPAP due to Covid infection -Afebrile -D-dimer greater than 20 on admission (??Hx of ankylosing spondylitis) -Patient with high risk of thromboembolic events, continue heparin drip for [facility] -Lower extremity Doppler negative for DVT -Chest x-ray showing vascular congestion -Completed course of decadron on 11/7 and completed remdesivir on 05/10/19 -CRP had trended down, now up to 20. Cont to follow inflammatory markers  AKI -Cr peaked to 1.63 from normal baseline -Likely 2/2 bacteremia vs pt on diuretics -Renal function has normalized -Repeat renal panel in AM  Thrombocytopenia -Suspect from COVID-19 infection -Plts now rising  Chronic A. Fib -Remains rate controlled at this time -Continue Coreg as tolerated -Continuing to hold Xarelto and continue on heparin gtt as tolerated  Chronic combined systolic and diastolic HF/history of bioprosthetic MVR/multiple PVCs noted on telemetry -Appears compensated -BNP 326 -Last EF was 40 to 45% in November 2019 -Cardiology reportedly reconsulted for multiple PVCs/bigeminy -Monitor electrolytes potassium, magnesium, replace as  needed -Continued on Coreg  as pt tolerates -Pt more hyponatremic with crackles. Have resumed diuretics. Ordered Lasix   Diabetes mellitus type 2 -Continue SSI, Accu-Cheks, hypoglycemic protocol -currently on lantus at 45 units with meal coverage at 8 units -Glucose trends seem to be stable, reviewed  Hyperlipidemia -Continue statin as tolerated  Constipation -Pt noted to have vomited recently with no documented bowel movement in several days in setting of narcotics for analgesia -personally reviewed abd xray. Stool seen in colon -Very good results with lactulose and dulcolax suppository   DVT prophylaxis: heparin gtt Code Status: Full Family Communication: Pt in room, family not at bedside Disposition Plan: Uncertain at this time  Consultants:   Neurosurgery  ID  Cardiology for TEE  Procedures:     Antimicrobials: Anti-infectives (From admission, onward)   Start     Dose/Rate Route Frequency Ordered Stop   05/15/19 1430  penicillin G potassium 12 Million Units in dextrose 5 % 500 mL continuous infusion     12 Million Units 41.7 mL/hr over 12 Hours Intravenous Every 12 hours 05/15/19 1343     05/13/19 1000  penicillin G potassium 12 Million Units in dextrose 5 % 500 mL continuous infusion  Status:  Discontinued     12 Million Units 41.7 mL/hr over 12 Hours Intravenous Every 12 hours 05/12/19 1159 05/15/19 1343   05/11/19 0730  cefTRIAXone (ROCEPHIN) 2 g in sodium chloride 0.9 % 100 mL IVPB  Status:  Discontinued     2 g 200 mL/hr over 30 Minutes Intravenous Every 24 hours 05/11/19 0721 05/12/19 1159   05/08/19 0200  remdesivir 100 mg in sodium chloride 0.9 % 250 mL IVPB     100 mg 500 mL/hr over 30 Minutes Intravenous Every 24 hours 05/07/19 0059 05/11/19 0513   05/07/19 0100  remdesivir 200 mg in sodium chloride 0.9 % 250 mL IVPB     200 mg 500 mL/hr over 30 Minutes Intravenous Once 05/07/19 0057 05/07/19 0512      Subjective: No complaints this AM   Objective: Vitals:   05/17/19 2304 05/18/19 0756 05/18/19 0809 05/18/19 1633  BP: 104/61  108/67 109/61  Pulse: (!) 57  69 69  Resp:   16 16  Temp:   97.7 F (36.5 C) 98.1 F (36.7 C)  TempSrc:   Oral Oral  SpO2:  98% 98% 99%  Weight:      Height:        Intake/Output Summary (Last 24 hours) at 05/18/2019 1738 Last data filed at 05/18/2019 1700 Gross per 24 hour  Intake 1078.7 ml  Output 1425 ml  Net -346.3 ml   Filed Weights   05/06/19 1457  Weight: 121.1 kg    Examination: General exam: Awake, laying in bed, in nad Respiratory system: Normal respiratory effort, no wheezing Cardiovascular system: regular rate, s1, s2 Gastrointestinal system: Soft, nondistended, positive BS Central nervous system: CN2-12 grossly intact, strength intact Extremities: Perfused, no clubbing Skin: Normal skin turgor, no notable skin lesions seen Psychiatry: Mood normal // no visual hallucinations   Data Reviewed: I have personally reviewed following labs and imaging studies  CBC: Recent Labs  Lab 05/12/19 0344  05/14/19 0505 05/15/19 0334 05/16/19 0354 05/17/19 0521 05/18/19 0500  WBC 11.3*   < > 12.9* 9.0 12.8* 13.5* 20.8*  NEUTROABS 10.4*  --   --   --   --   --   --   HGB 12.5*   < > 13.3 13.1 13.6 12.4* 11.3*  HCT 39.4   < >  42.2 40.8 41.3 38.4* 35.4*  MCV 87.2   < > 87.7 85.9 84.8 84.6 86.8  PLT 119*   < > 101* 83* 99* 114* 123*   < > = values in this interval not displayed.   Basic Metabolic Panel: Recent Labs  Lab 05/12/19 0344 05/15/19 0334 05/17/19 0521  NA 141 132* 129*  K 4.5 5.3* 4.6  CL 102 99 96*  CO2 29 25 25   GLUCOSE 298* 283* 194*  BUN 50* 21 14  CREATININE 1.22 0.80 0.79  CALCIUM 8.3* 8.3* 8.1*   GFR: Estimated Creatinine Clearance: 113.6 mL/min (by C-G formula based on SCr of 0.79 mg/dL). Liver Function Tests: Recent Labs  Lab 05/12/19 0344 05/15/19 0334 05/17/19 0521  AST 16 15 20   ALT 17 13 16   ALKPHOS 69 64 62  BILITOT 1.2 1.6* 1.1   PROT 5.7* 5.6* 5.3*  ALBUMIN 2.2* 2.0* 1.9*   No results for input(s): LIPASE, AMYLASE in the last 168 hours. No results for input(s): AMMONIA in the last 168 hours. Coagulation Profile: No results for input(s): INR, PROTIME in the last 168 hours. Cardiac Enzymes: No results for input(s): CKTOTAL, CKMB, CKMBINDEX, TROPONINI in the last 168 hours. BNP (last 3 results) No results for input(s): PROBNP in the last 8760 hours. HbA1C: No results for input(s): HGBA1C in the last 72 hours. CBG: Recent Labs  Lab 05/17/19 1818 05/17/19 2045 05/18/19 0807 05/18/19 1203 05/18/19 1630  GLUCAP 158* 112* 205* 167* 178*   Lipid Profile: No results for input(s): CHOL, HDL, LDLCALC, TRIG, CHOLHDL, LDLDIRECT in the last 72 hours. Thyroid Function Tests: No results for input(s): TSH, T4TOTAL, FREET4, T3FREE, THYROIDAB in the last 72 hours. Anemia Panel: Recent Labs    05/17/19 0521 05/18/19 0500  FERRITIN 186 269   Sepsis Labs: Recent Labs  Lab 05/12/19 0344  PROCALCITON 16.18    Recent Results (from the past 240 hour(s))  Culture, blood (routine x 2)     Status: Abnormal   Collection Time: 05/10/19  4:26 PM   Specimen: BLOOD LEFT HAND  Result Value Ref Range Status   Specimen Description BLOOD LEFT HAND  Final   Special Requests   Final    BOTTLES DRAWN AEROBIC ONLY Blood Culture adequate volume   Culture  Setup Time   Final    GRAM POSITIVE COCCI AEROBIC BOTTLE ONLY CRITICAL VALUE NOTED.  VALUE IS CONSISTENT WITH PREVIOUSLY REPORTED AND CALLED VALUE. Performed at [facility], [place] [address], [place], [place] [address]    Culture STREPTOCOCCUS GROUP G (A)  Final   Report Status 05/13/2019 FINAL  Final   Organism ID, Bacteria STREPTOCOCCUS GROUP G  Final      Susceptibility   Streptococcus group g - MIC*    CLINDAMYCIN <=0.25 SENSITIVE Sensitive     AMPICILLIN <=0.25 SENSITIVE Sensitive     ERYTHROMYCIN <=0.12 SENSITIVE Sensitive     VANCOMYCIN 0.25 SENSITIVE  Sensitive     CEFTRIAXONE <=0.12 SENSITIVE Sensitive     LEVOFLOXACIN 0.5 SENSITIVE Sensitive     PENICILLIN Value in next row Sensitive      SENSITIVE<=0.06    * STREPTOCOCCUS GROUP G  Culture, blood (routine x 2)     Status: Abnormal   Collection Time: 05/10/19  4:26 PM   Specimen: BLOOD LEFT HAND  Result Value Ref Range Status   Specimen Description BLOOD LEFT HAND  Final   Special Requests   Final    BOTTLES DRAWN AEROBIC ONLY Blood Culture  adequate volume   Culture  Setup Time   Final    GRAM POSITIVE COCCI AEROBIC BOTTLE ONLY CRITICAL RESULT CALLED TO, READ BACK BY AND VERIFIED WITH: Andres Shad PharmD 7:15 05/11/19 ([email or web address])    Culture (A)  Final    STREPTOCOCCUS GROUP G SUSCEPTIBILITIES PERFORMED ON PREVIOUS CULTURE WITHIN THE LAST 5 DAYS. Performed at [facility], [place] [address] [place], [place] [address]    Report Status 05/13/2019 FINAL  Final  Blood Culture ID Panel (Reflexed)     Status: Abnormal   Collection Time: 05/10/19  4:26 PM  Result Value Ref Range Status   Enterococcus species NOT DETECTED NOT DETECTED Final   Listeria monocytogenes NOT DETECTED NOT DETECTED Final   Staphylococcus species NOT DETECTED NOT DETECTED Final   Staphylococcus aureus (BCID) NOT DETECTED NOT DETECTED Final   Streptococcus species DETECTED (A) NOT DETECTED Final    Comment: Not Enterococcus species, Streptococcus agalactiae, Streptococcus pyogenes, or Streptococcus pneumoniae. CRITICAL RESULT CALLED TO, READ BACK BY AND VERIFIED WITH: Andres Shad PharmD 7:15 05/11/19 ([email or web address])    Streptococcus agalactiae NOT DETECTED NOT DETECTED Final   Streptococcus pneumoniae NOT DETECTED NOT DETECTED Final   Streptococcus pyogenes NOT DETECTED NOT DETECTED Final   Acinetobacter baumannii NOT DETECTED NOT DETECTED Final   Enterobacteriaceae species NOT DETECTED NOT DETECTED Final   Enterobacter cloacae complex NOT DETECTED NOT DETECTED Final   Escherichia coli NOT DETECTED NOT DETECTED Final    Klebsiella oxytoca NOT DETECTED NOT DETECTED Final   Klebsiella pneumoniae NOT DETECTED NOT DETECTED Final   Proteus species NOT DETECTED NOT DETECTED Final   Serratia marcescens NOT DETECTED NOT DETECTED Final   Haemophilus influenzae NOT DETECTED NOT DETECTED Final   Neisseria meningitidis NOT DETECTED NOT DETECTED Final   Pseudomonas aeruginosa NOT DETECTED NOT DETECTED Final   Candida albicans NOT DETECTED NOT DETECTED Final   Candida glabrata NOT DETECTED NOT DETECTED Final   Candida krusei NOT DETECTED NOT DETECTED Final   Candida parapsilosis NOT DETECTED NOT DETECTED Final   Candida tropicalis NOT DETECTED NOT DETECTED Final    Comment: Performed at [facility], [place]. [address], [place], [place] [address]  Culture, Urine     Status: None   Collection Time: 05/10/19  7:13 PM   Specimen: Urine, Catheterized  Result Value Ref Range Status   Specimen Description URINE, CATHETERIZED  Final   Special Requests NONE  Final   Culture   Final    NO GROWTH Performed at [facility], [address] [place], [place] [address]    Report Status 05/11/2019 FINAL  Final  Culture, blood (routine x 2)     Status: Abnormal   Collection Time: 05/11/19 12:35 PM   Specimen: BLOOD  Result Value Ref Range Status   Specimen Description BLOOD RIGHT ANTECUBITAL  Final   Special Requests   Final    BOTTLES DRAWN AEROBIC ONLY Blood Culture results may not be optimal due to an inadequate volume of blood received in culture bottles   Culture  Setup Time   Final    GRAM POSITIVE COCCI AEROBIC BOTTLE ONLY CRITICAL RESULT CALLED TO, READ BACK BY AND VERIFIED WITH: C. WALSTON PHARMD, AT [id number] 05/12/19 BY D. VANHOOK    Culture (A)  Final    STAPHYLOCOCCUS SPECIES (COAGULASE NEGATIVE) THE SIGNIFICANCE OF ISOLATING THIS ORGANISM FROM A SINGLE SET OF BLOOD CULTURES WHEN MULTIPLE SETS ARE DRAWN IS UNCERTAIN. PLEASE NOTIFY THE MICROBIOLOGY DEPARTMENT WITHIN ONE WEEK IF  SPECIATION AND SENSITIVITIES  ARE REQUIRED. Performed at [facility], [place] [address], [place], [place] [address]    Report Status 05/13/2019 FINAL  Final  Culture, blood (routine x 2)     Status: None   Collection Time: 05/11/19 12:45 PM   Specimen: BLOOD RIGHT HAND  Result Value Ref Range Status   Specimen Description BLOOD RIGHT HAND  Final   Special Requests   Final    BOTTLES DRAWN AEROBIC ONLY Blood Culture results may not be optimal due to an inadequate volume of blood received in culture bottles   Culture   Final    NO GROWTH 5 DAYS Performed at [facility], [place] [address], [place], [place] [address]    Report Status 05/16/2019 FINAL  Final     Radiology Studies: Dg Chest Port 1 View  Result Date: 05/18/2019 CLINICAL DATA:  Pneumonia. EXAM: PORTABLE CHEST 1 VIEW COMPARISON:  May 15, 2019. FINDINGS: Stable cardiomegaly. No pneumothorax or pleural effusion is noted. Right-sided PICC line is noted with distal tip in expected position of cavoatrial junction. No acute pulmonary disease is noted. Bony thorax is unremarkable. IMPRESSION: Right-sided PICC line is been repositioned with tip in expected position of cavoatrial junction. No acute cardiopulmonary abnormality seen. Electronically Signed   By: Marijo Conception M.D.   On: 05/18/2019 10:52   Ir Picc Placement Right >5 Yrs Inc Img Guide  Result Date: 05/17/2019 INDICATION: BACTEREMIA, ACCESS FOR LONG-TERM ANTIBIOTICS EXAM: ULTRASOUND AND FLUOROSCOPIC GUIDED PICC LINE INSERTION MEDICATIONS: 1% LIDOCAINE LOCAL CONTRAST:  None FLUOROSCOPY TIME:  120 seconds (12 mGy) COMPLICATIONS: None immediate. TECHNIQUE: The procedure, risks, benefits, and alternatives were explained to the patient's family and informed written consent was obtained. A timeout was performed prior to the initiation of the procedure. The right upper extremity was prepped with chlorhexidine in a sterile fashion, and a sterile drape was applied covering the operative field. Maximum  barrier sterile technique with sterile gowns and gloves were used for the procedure. A timeout was performed prior to the initiation of the procedure. Local anesthesia was provided with 1% lidocaine. Under direct ultrasound guidance, the right brachial vein was accessed with a micropuncture kit after the overlying soft tissues were anesthetized with 1% lidocaine. An ultrasound image was saved for documentation purposes. A guidewire was advanced to the level of the superior caval-atrial junction for measurement purposes and the PICC line was cut to length. A peel-away sheath was placed and a 45 cm, 5 [place], dual lumen was inserted to level of the superior caval-atrial junction. A post procedure spot fluoroscopic was obtained. The catheter easily aspirated and flushed and was sutured in place. A dressing was placed. The patient tolerated the procedure well without immediate post procedural complication. FINDINGS: After catheter placement, the tip lies within the superior cavoatrial junction. The catheter aspirates and flushes normally and is ready for immediate use. IMPRESSION: Successful ultrasound and fluoroscopic guided placement of a right brachial vein approach, 45 cm, 5 French, dual lumen PICC with tip at the superior caval-atrial junction. The PICC line is ready for immediate use. Electronically Signed   By: Jerilynn Mages.  Shick M.D.   On: 05/17/2019 16:35    Scheduled Meds: . carvedilol  3.125 mg Oral BID  . Chlorhexidine Gluconate Cloth  6 each Topical Daily  . docusate sodium  100 mg Oral Daily  . fluticasone  2 spray Each Nare Daily  . fluticasone furoate-vilanterol  1 puff Inhalation Daily  . furosemide  40 mg Intravenous Daily  . insulin aspart  0-5 Units Subcutaneous QHS  . insulin aspart  0-9 Units Subcutaneous TID WC  . insulin aspart  8 Units Subcutaneous TID WC  . insulin glargine  45 Units Subcutaneous Daily  . loratadine  10 mg Oral QPM  . multivitamin with minerals  1 tablet Oral Daily  .  pantoprazole  40 mg Oral Daily  . polyethylene glycol  17 g Oral Daily  . Ensure Max Protein  11 oz Oral BID  . senna-docusate  2 tablet Oral BID  . umeclidinium bromide  1 puff Inhalation Daily  . vitamin C  500 mg Oral Daily  . zinc sulfate  220 mg Oral Daily   Continuous Infusions: . heparin 2,000 Units/hr (05/18/19 1221)  . penicillin g continuous IV infusion 12 Million Units (05/18/19 0600)     LOS: 12 days   Marylu Lund, MD [facility] Pager On Amion  If 7PM-7AM, please contact night-coverage 05/18/2019, 5:38 PM

## 2019-05-18 NOTE — Progress Notes (Signed)
[facility]   Reason for visit: Follow up on bacteremia  Interval History: repeat blood culture without new growth, WBC up to 20.8, remains afebrile.  He has no new complaints.  No associated rash or diarrhea.   Physical Exam: Constitutional: alert, nad Vitals:   05/18/19 0756 05/18/19 0809  BP:  108/67  Pulse:  69  Resp:  16  Temp:  97.7 F (36.5 C)  SpO2: 98% 98%  HENT: no thrush CV: RRR Lungs: CTA B; normal respiratory effort Abd: soft, nt MS: no edema   Lab Results  Component Value Date   WBC 20.8 (H) 05/18/2019   HGB 11.3 (L) 05/18/2019   HCT 35.4 (L) 05/18/2019   MCV 86.8 05/18/2019   PLT 123 (L) 05/18/2019    Lab Results  Component Value Date   CREATININE 0.79 05/17/2019   BUN 14 05/17/2019   NA 129 (L) 05/17/2019   K 4.6 05/17/2019   CL 96 (L) 05/17/2019   CO2 25 05/17/2019    Lab Results  Component Value Date   ALT 16 05/17/2019   AST 20 05/17/2019   ALKPHOS 62 05/17/2019     Microbiology: Recent Results (from the past 240 hour(s))  Culture, blood (routine x 2)     Status: Abnormal   Collection Time: 05/10/19  4:26 PM   Specimen: BLOOD LEFT HAND  Result Value Ref Range Status   Specimen Description BLOOD LEFT HAND  Final   Special Requests   Final    BOTTLES DRAWN AEROBIC ONLY Blood Culture adequate volume   Culture  Setup Time   Final    GRAM POSITIVE COCCI AEROBIC BOTTLE ONLY CRITICAL VALUE NOTED.  VALUE IS CONSISTENT WITH PREVIOUSLY REPORTED AND CALLED VALUE. Performed at [facility], [address], [place], [place] [address]    Culture STREPTOCOCCUS GROUP G (A)  Final   Report Status 05/13/2019 FINAL  Final   Organism ID, Bacteria STREPTOCOCCUS GROUP G  Final      Susceptibility   Streptococcus group g - MIC*    CLINDAMYCIN <=0.25 SENSITIVE Sensitive     AMPICILLIN <=0.25 SENSITIVE Sensitive     ERYTHROMYCIN <=0.12 SENSITIVE Sensitive     VANCOMYCIN 0.25 SENSITIVE Sensitive     CEFTRIAXONE  <=0.12 SENSITIVE Sensitive     LEVOFLOXACIN 0.5 SENSITIVE Sensitive     PENICILLIN Value in next row Sensitive      SENSITIVE<=0.06    * STREPTOCOCCUS GROUP G  Culture, blood (routine x 2)     Status: Abnormal   Collection Time: 05/10/19  4:26 PM   Specimen: BLOOD LEFT HAND  Result Value Ref Range Status   Specimen Description BLOOD LEFT HAND  Final   Special Requests   Final    BOTTLES DRAWN AEROBIC ONLY Blood Culture adequate volume   Culture  Setup Time   Final    GRAM POSITIVE COCCI AEROBIC BOTTLE ONLY CRITICAL RESULT CALLED TO, READ BACK BY AND VERIFIED WITH: Peter Minium PharmD 7:15 05/11/19 ([email or web address])    Culture (A)  Final    STREPTOCOCCUS GROUP G SUSCEPTIBILITIES PERFORMED ON PREVIOUS CULTURE WITHIN THE LAST 5 DAYS. Performed at [facility], [address], [place], [place] [address]    Report Status 05/13/2019 FINAL  Final  Blood Culture ID Panel (Reflexed)     Status: Abnormal   Collection Time: 05/10/19  4:26 PM  Result Value Ref Range Status   Enterococcus species NOT DETECTED NOT DETECTED Final  Listeria monocytogenes NOT DETECTED NOT DETECTED Final   Staphylococcus species NOT DETECTED NOT DETECTED Final   Staphylococcus aureus (BCID) NOT DETECTED NOT DETECTED Final   Streptococcus species DETECTED (A) NOT DETECTED Final    Comment: Not Enterococcus species, Streptococcus agalactiae, Streptococcus pyogenes, or Streptococcus pneumoniae. CRITICAL RESULT CALLED TO, READ BACK BY AND VERIFIED WITH: Peter Minium PharmD 7:15 05/11/19 ([email or web address])    Streptococcus agalactiae NOT DETECTED NOT DETECTED Final   Streptococcus pneumoniae NOT DETECTED NOT DETECTED Final   Streptococcus pyogenes NOT DETECTED NOT DETECTED Final   Acinetobacter baumannii NOT DETECTED NOT DETECTED Final   Enterobacteriaceae species NOT DETECTED NOT DETECTED Final   Enterobacter cloacae complex NOT DETECTED NOT DETECTED Final   Escherichia coli NOT DETECTED NOT DETECTED Final   Klebsiella oxytoca NOT  DETECTED NOT DETECTED Final   Klebsiella pneumoniae NOT DETECTED NOT DETECTED Final   Proteus species NOT DETECTED NOT DETECTED Final   Serratia marcescens NOT DETECTED NOT DETECTED Final   Haemophilus influenzae NOT DETECTED NOT DETECTED Final   Neisseria meningitidis NOT DETECTED NOT DETECTED Final   Pseudomonas aeruginosa NOT DETECTED NOT DETECTED Final   Candida albicans NOT DETECTED NOT DETECTED Final   Candida glabrata NOT DETECTED NOT DETECTED Final   Candida krusei NOT DETECTED NOT DETECTED Final   Candida parapsilosis NOT DETECTED NOT DETECTED Final   Candida tropicalis NOT DETECTED NOT DETECTED Final    Comment: Performed at [facility], [address], [place], [place] [address]  Culture, Urine     Status: None   Collection Time: 05/10/19  7:13 PM   Specimen: Urine, Catheterized  Result Value Ref Range Status   Specimen Description URINE, CATHETERIZED  Final   Special Requests NONE  Final   Culture   Final    NO GROWTH Performed at [facility], [address], [place], [place] [address]    Report Status 05/11/2019 FINAL  Final  Culture, blood (routine x 2)     Status: Abnormal   Collection Time: 05/11/19 12:35 PM   Specimen: BLOOD  Result Value Ref Range Status   Specimen Description BLOOD RIGHT ANTECUBITAL  Final   Special Requests   Final    BOTTLES DRAWN AEROBIC ONLY Blood Culture results may not be optimal due to an inadequate volume of blood received in culture bottles   Culture  Setup Time   Final    GRAM POSITIVE COCCI AEROBIC BOTTLE ONLY CRITICAL RESULT CALLED TO, READ BACK BY AND VERIFIED WITH: C. WALSTON PHARMD, AT [id number] 05/12/19 BY D. VANHOOK    Culture (A)  Final    STAPHYLOCOCCUS SPECIES (COAGULASE NEGATIVE) THE SIGNIFICANCE OF ISOLATING THIS ORGANISM FROM A SINGLE SET OF BLOOD CULTURES WHEN MULTIPLE SETS ARE DRAWN IS UNCERTAIN. PLEASE NOTIFY THE MICROBIOLOGY DEPARTMENT WITHIN ONE WEEK IF SPECIATION AND SENSITIVITIES ARE REQUIRED. Performed  at [facility], [address], [place], [place] [address]    Report Status 05/13/2019 FINAL  Final  Culture, blood (routine x 2)     Status: None   Collection Time: 05/11/19 12:45 PM   Specimen: BLOOD RIGHT HAND  Result Value Ref Range Status   Specimen Description BLOOD RIGHT HAND  Final   Special Requests   Final    BOTTLES DRAWN AEROBIC ONLY Blood Culture results may not be optimal due to an inadequate volume of blood received in culture bottles   Culture   Final    NO GROWTH 5 DAYS Performed at [facility]  Lab, [address], [place], [place] [address]    Report Status 05/16/2019 FINAL  Final    Impression/Plan:   1.  Bacteremia - bacteremia.  Repeat cultures without the same growth.  No current fever and to get a TEE thursday.  Will continue to follow.    2.  Fractured back - followed by neurosurgery and plan for surgery once infection cleared.  Duration of above depends on TEE.    3.  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  TEE scheduled.    Will continue to follow

## 2019-05-18 NOTE — Progress Notes (Signed)
ANTICOAGULATION CONSULT NOTE  Pharmacy Consult for Heparin Indication: atrial fibrillation   Patient Measurements: Height: 5\' 9"  (175.3 cm) Weight: 267 lb (121.1 kg) IBW/kg (Calculated) : 70.7 Heparin Dosing Weight: 98 kg  Vital Signs: Temp: 97.7 F (36.5 C) (11/10 0809) Temp Source: Oral (11/10 0809) BP: 108/67 (11/10 0809) Pulse Rate: 69 (11/10 0809)  Labs: Recent Labs    05/16/19 0354 05/17/19 0521 05/18/19 0500  HGB 13.6 12.4* 11.3*  HCT 41.3 38.4* 35.4*  PLT 99* 114* 123*  HEPARINUNFRC 0.66 0.70 0.61  CREATININE  --  0.79  --      Assessment: 68 year old male on Xarelto prior to admission for atrial fibrillation who presented post fall and was transitioned to Heparin IV due to need for possible lumbar surgery.   Heparin level is therapeutic on current rate of 2000 units/hr.  Hemoglobin is stable at 11.3. Platelets are stable at 123.  No bleeding reported.    Goal of Therapy:  Heparin level 0.3-0.7 units/ml Monitor platelets by anticoagulation protocol: Yes    Plan:  - Continue Heparin at 2000 units/hr - Daily HL, CBC   Harvel Quale 05/18/2019 9:01 AM

## 2019-05-19 DIAGNOSIS — J9611 Chronic respiratory failure with hypoxia: Secondary | ICD-10-CM | POA: Diagnosis not present

## 2019-05-19 DIAGNOSIS — R7881 Bacteremia: Secondary | ICD-10-CM | POA: Diagnosis not present

## 2019-05-19 DIAGNOSIS — I482 Chronic atrial fibrillation, unspecified: Secondary | ICD-10-CM | POA: Diagnosis not present

## 2019-05-19 DIAGNOSIS — S32009A Unspecified fracture of unspecified lumbar vertebra, initial encounter for closed fracture: Secondary | ICD-10-CM | POA: Diagnosis not present

## 2019-05-19 LAB — BASIC METABOLIC PANEL
Anion gap: 8 (ref 5–15)
BUN: 19 mg/dL (ref 8–23)
CO2: 26 mmol/L (ref 22–32)
Calcium: 7.7 mg/dL — ABNORMAL LOW (ref 8.9–10.3)
Chloride: 94 mmol/L — ABNORMAL LOW (ref 98–111)
Creatinine, Ser: 0.88 mg/dL (ref 0.61–1.24)
GFR calc Af Amer: >60 mL/min (ref >60)
GFR calc non Af Amer: >60 mL/min (ref >60)
Glucose, Bld: 182 mg/dL — ABNORMAL HIGH (ref 70–99)
Potassium: 4.9 mmol/L (ref 3.5–5.1)
Sodium: 128 mmol/L — ABNORMAL LOW (ref 135–145)

## 2019-05-19 LAB — URINALYSIS, ROUTINE W REFLEX MICROSCOPIC
Bacteria, UA: NONE SEEN
Bilirubin Urine: NEGATIVE
Glucose, UA: NEGATIVE mg/dL
Ketones, ur: NEGATIVE mg/dL
Leukocytes,Ua: NEGATIVE
Nitrite: NEGATIVE
Protein, ur: NEGATIVE mg/dL
Specific Gravity, Urine: 1.013 (ref 1.005–1.030)
pH: 5 (ref 5.0–8.0)

## 2019-05-19 LAB — COMPREHENSIVE METABOLIC PANEL
ALT: 19 U/L (ref 0–44)
AST: 41 U/L (ref 15–41)
Albumin: 1.7 g/dL — ABNORMAL LOW (ref 3.5–5.0)
Alkaline Phosphatase: 65 U/L (ref 38–126)
Anion gap: 7 (ref 5–15)
BUN: 20 mg/dL (ref 8–23)
CO2: 27 mmol/L (ref 22–32)
Calcium: 7.9 mg/dL — ABNORMAL LOW (ref 8.9–10.3)
Chloride: 93 mmol/L — ABNORMAL LOW (ref 98–111)
Creatinine, Ser: 0.91 mg/dL (ref 0.61–1.24)
GFR calc Af Amer: >60 mL/min (ref >60)
GFR calc non Af Amer: >60 mL/min (ref >60)
Glucose, Bld: 207 mg/dL — ABNORMAL HIGH (ref 70–99)
Potassium: 5.3 mmol/L — ABNORMAL HIGH (ref 3.5–5.1)
Sodium: 127 mmol/L — ABNORMAL LOW (ref 135–145)
Total Bilirubin: 1.2 mg/dL (ref 0.3–1.2)
Total Protein: 5.4 g/dL — ABNORMAL LOW (ref 6.5–8.1)

## 2019-05-19 LAB — OSMOLALITY, URINE: Osmolality, Ur: 383 mOsm/kg (ref 300–900)

## 2019-05-19 LAB — CBC
HCT: 32 % — ABNORMAL LOW (ref 39.0–52.0)
Hemoglobin: 10.5 g/dL — ABNORMAL LOW (ref 13.0–17.0)
MCH: 28.2 pg (ref 26.0–34.0)
MCHC: 32.8 g/dL (ref 30.0–36.0)
MCV: 86 fL (ref 80.0–100.0)
Platelets: 159 10*3/uL (ref 150–400)
RBC: 3.72 MIL/uL — ABNORMAL LOW (ref 4.22–5.81)
RDW: 15.4 % (ref 11.5–15.5)
WBC: 13.5 10*3/uL — ABNORMAL HIGH (ref 4.0–10.5)
nRBC: 0 % (ref 0.0–0.2)

## 2019-05-19 LAB — FERRITIN: Ferritin: 287 ng/mL (ref 24–336)

## 2019-05-19 LAB — OSMOLALITY: Osmolality: 281 mOsm/kg (ref 275–295)

## 2019-05-19 LAB — GLUCOSE, CAPILLARY
Glucose-Capillary: 183 mg/dL — ABNORMAL HIGH (ref 70–99)
Glucose-Capillary: 213 mg/dL — ABNORMAL HIGH (ref 70–99)
Glucose-Capillary: 152 mg/dL — ABNORMAL HIGH (ref 70–99)
Glucose-Capillary: 141 mg/dL — ABNORMAL HIGH (ref 70–99)

## 2019-05-19 LAB — C-REACTIVE PROTEIN: CRP: 21 mg/dL — ABNORMAL HIGH (ref <1.0)

## 2019-05-19 LAB — SODIUM, URINE, RANDOM: Sodium, Ur: 31 mmol/L

## 2019-05-19 LAB — HEPARIN LEVEL (UNFRACTIONATED): Heparin Unfractionated: 0.36 IU/mL (ref 0.30–0.70)

## 2019-05-19 MED ORDER — SODIUM ZIRCONIUM CYCLOSILICATE 5 G PO PACK
5.0000 g | PACK | Freq: Once | ORAL | Status: AC
  Administered 2019-05-19: 5 g via ORAL
  Filled 2019-05-19 (×2): qty 1

## 2019-05-19 MED ORDER — SODIUM CHLORIDE 0.9 % IV SOLN
INTRAVENOUS | Status: DC
  Administered 2019-05-20: via INTRAVENOUS

## 2019-05-19 MED ORDER — MORPHINE SULFATE (PF) 2 MG/ML IV SOLN
1.0000 mg | INTRAVENOUS | Status: DC | PRN
  Administered 2019-05-20 – 2019-05-24 (×5): 1 mg via INTRAVENOUS
  Filled 2019-05-19 (×5): qty 1

## 2019-05-19 NOTE — Progress Notes (Signed)
[facility]  PROGRESS NOTE  Cody Hale EHU:[id number] DOB: 1950-09-02 DOA: 05/06/2019 PCP: Cody Lima, MD Admit date - 05/06/2019   Admitting Physician Cody Friendly, MD  Outpatient Primary MD for the patient is Cody Lima, MD  LOS - 19 Brief Narrative   Cody Hale is a 68 y.o. 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  Hospital course complicated by [id number] infection positive with no symptoms though he did complete 5 days of remdesivir and full course of heparin drip GBS bacteremia IV penicillin, currently awaiting TEE  Subjective  Cody Hale today has,some difficulty swallowing and productive cough. No fevers or chills. No nausea. Minimal appetite. Persistent back pain  A & P   1. Unstable L1-L2 fracture. 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  Neurosurgery plans for operative stabilization pending resolution of infectious process addressed in #2, continue conservative management with TLSO bracing and bed rest  2. GBS bacteremia with history of mitral prosthetic valve, stable. On penicillin and remains afebrile, white count trending down and no recurrent bacteremia. Pending TEE on 05/20/19 given high concern for endocarditis with history of prosthetic valve-if positive will need to add gentamicin and consult CT surgery  3. COVID 19 pneumonia. Repeat CXR shows resolution of consolidation/opacities. Ferritin, CRP are increasing Currently on 3 L with normal oxygen saturation.  Exam continue vitamin C and zinc, continue to monitor inflammatory  markers.  4. Hyponatremia with normal serum osm ( 281), slightly worsening.  Sodium currently 128 after repeat ( nadir of 127 in 24 hours), previously 129-132 in days prior.  Serum osm within normal limits.  Glucose within normal limits, no hyperproteinemia, check LDL, would expect hypotonic hyponatremia in the setting of diuresis.  Continue to monitor BMP.  5. Hyperkalemia, resolved.  Peak of 5.3, improved after Lokelma x1.  Not on any potassium supplementation, monitor BMP.  6. Concern for swallowing difficulty.  Patient denies however noticed grimacing in difficulty with swallowing pills.  Speech eval ordered.  7. CHF with reduced EF, mild exacerbation.  1.3 L out in last 24 hours, net -800 L.  No overt peripheral edema, stable O2, difficult to assess for crackles but recent chest x-ray shows no lung involvement.  It is possible hyponatremia related to some mild hypervolemia, continue to monitor, if no improvement may need to repeat TTE, continue fluid restriction, daily weights, monitoring BMP continue Coreg, IV Lasix (on torsemide 20 mg twice daily, home regimen) started last 24 hours related to crackles on exam and hyponatremia.  8. Chronic atrial fibrillation, rate controlled.  Holding home Xarelto, continue heparin drip given will likely need neurosurgical intervention once infection status stable.  9. Type 2 diabetes, continue Lantus 25 units CBGs at goal (reduced from home regimen of Tresiba 50 units daily), NovoLog scheduled 8 units 3 times daily, sliding scale as needed.  Holding home Hall  10. Thrombocytopenia, resolved.  Currently 10 mg, no active signs of bleeding, monitor CBC.   11. GERD, stable.  Continue ProtonixDiminished appetite.  Continue Ensure twice daily  12. COPD, stable.  Continue Incruse.     Family Communication  : Spoke with son Cody Hale  at [phone] on 05/19/19  Code Status :  FULL  Disposition Plan  : Continue inpatient stay, requires TEE to out  infective endocarditis, monitor electrolytes  Consults  : ID, neurosurgery, cardiology Procedures  : 10/31 venous duplex TEE, 05/1119  DVT Prophylaxis  : IV Heparin  Lab Results  Component Value Date   PLT 159 05/19/2019    Diet :  Diet Order            Diet NPO time specified  Diet effective midnight        Diet heart healthy/carb modified Room service appropriate? Yes; Fluid consistency: Thin  Diet effective now               Inpatient Medications Scheduled Meds:  carvedilol  3.125 mg Oral BID   Chlorhexidine Gluconate Cloth  6 each Topical Daily   docusate sodium  100 mg Oral Daily   fluticasone  2 spray Each Nare Daily   fluticasone furoate-vilanterol  1 puff Inhalation Daily   furosemide  40 mg Intravenous Daily   insulin aspart  0-5 Units Subcutaneous QHS   insulin aspart  0-9 Units Subcutaneous TID WC   insulin aspart  8 Units Subcutaneous TID WC   insulin glargine  45 Units Subcutaneous Daily   loratadine  10 mg Oral QPM   multivitamin with minerals  1 tablet Oral Daily   pantoprazole  40 mg Oral Daily   polyethylene glycol  17 g Oral Daily   Ensure Max Protein  11 oz Oral BID   senna-docusate  2 tablet Oral BID   umeclidinium bromide  1 puff Inhalation Daily   vitamin C  500 mg Oral Daily   zinc sulfate  220 mg Oral Daily   Continuous Infusions:  heparin 2,000 Units/hr (05/19/19 1529)   penicillin g continuous IV infusion 41.7 mL/hr at 05/19/19 1010   PRN Meds:.acetaminophen **OR** acetaminophen, chlorpheniramine-HYDROcodone, guaiFENesin-dextromethorphan, hydrALAZINE, lidocaine (PF), metoprolol tartrate, morphine injection, nitroGLYCERIN, ondansetron **OR** ondansetron (ZOFRAN) IV, oxyCODONE  Antibiotics  :   Anti-infectives (From admission, onward)   Start     Dose/Rate Route Frequency Ordered Stop   05/15/19 1430  penicillin G potassium 12 Million Units in dextrose 5 % 500 mL continuous infusion     12 Million Units 41.7 mL/hr  over 12 Hours Intravenous Every 12 hours 05/15/19 1343     05/13/19 1000  penicillin G potassium 12 Million Units in dextrose 5 % 500 mL continuous infusion  Status:  Discontinued     12 Million Units 41.7 mL/hr over 12 Hours Intravenous Every 12 hours 05/12/19 1159 05/15/19 1343   05/11/19 0730  cefTRIAXone (ROCEPHIN) 2 g in sodium chloride 0.9 % 100 mL IVPB  Status:  Discontinued     2 g 200 mL/hr over 30 Minutes Intravenous Every 24 hours 05/11/19 0721 05/12/19 1159   05/08/19 0200  remdesivir 100 mg in sodium chloride 0.9 % 250 mL IVPB     100 mg 500 mL/hr over 30 Minutes Intravenous Every 24 hours 05/07/19 0059 05/11/19 0513   05/07/19 0100  remdesivir 200 mg in sodium chloride 0.9 % 250 mL IVPB     200 mg 500 mL/hr over 30 Minutes Intravenous Once 05/07/19 0057 05/07/19 0512       Objective   Vitals:   05/19/19 0759 05/19/19 0806 05/19/19 0929 05/19/19 1707  BP: (!) 94/54  110/63 114/60  Pulse: 88 75 80 67  Resp: _0 Temp: 98.2 F (36.8 C)  99.5 F (37.5 C)  TempSrc: Oral   Oral  SpO2: 100% 98%  99%  Weight:      Height:        SpO2: 99 % O2 Flow Rate (L/min): 3 L/min FiO2 (%): 32 %  Wt Readings from Last 3 Encounters:  05/06/19 121.1 kg  03/24/19 120.7 kg  02/16/19 117.9 kg     Intake/Output Summary (Last 24 hours) at 05/19/2019 1808 Last data filed at 05/19/2019 1131 Gross per 24 hour  Intake 240 ml  Output 650 ml  Net -410 ml    Physical Exam:  Awake Alert, Oriented X 3, Normal affect No new F.N deficits,  [place].AT, No JVD appreciated Symmetrical Chest wall movement, Good air movement bilaterally, no respiratory distress on 3 L RRR,No Gallops,Rubs or new Murmurs,  +ve B.Sounds, Abd Soft, No tenderness, No rebound, guarding or rigidity. No Cyanosis, Clubbing or edema,  No new Rash or bruise     I have personally reviewed the following:   Data Reviewed:  CBC Recent Labs  Lab 05/15/19 0334 05/16/19 0354 05/17/19 0521 05/18/19 0500  05/19/19 0942  WBC 9.0 12.8* 13.5* 20.8* 13.5*  HGB 13.1 13.6 12.4* 11.3* 10.5*  HCT 40.8 41.3 38.4* 35.4* 32.0*  PLT 83* 99* 114* 123* 159  MCV 85.9 84.8 84.6 86.8 86.0  MCH 27.6 27.9 27.3 27.7 28.2  MCHC 32.1 32.9 32.3 31.9 32.8  RDW 15.3 15.3 15.1 15.3 15.4    Chemistries  Recent Labs  Lab 05/15/19 0334 05/17/19 0521 05/19/19 0942 05/19/19 1516  NA 132* 129* 127* 128*  K 5.3* 4.6 5.3* 4.9  CL 99 96* 93* 94*  CO2 _0 GLUCOSE 283* 194* 207* 182*  BUN _1 CREATININE 0.80 0.79 0.91 0.88  CALCIUM 8.3* 8.1* 7.9* 7.7*  AST 15 20 41  --   ALT _2 --   ALKPHOS 64 62 65  --   BILITOT 1.6* 1.1 1.2  --    ------------------------------------------------------------------------------------------------------------------ No results for input(s): CHOL, HDL, LDLCALC, TRIG, CHOLHDL, LDLDIRECT in the last 72 hours.  Lab Results  Component Value Date   HGBA1C 7.4 (H) 03/24/2019   ------------------------------------------------------------------------------------------------------------------ No results for input(s): TSH, T4TOTAL, T3FREE, THYROIDAB in the last 72 hours.  Invalid input(s): FREET3 ------------------------------------------------------------------------------------------------------------------ Recent Labs    05/18/19 0500 05/19/19 0942  FERRITIN 269 287    Coagulation profile No results for input(s): INR, PROTIME in the last 168 hours.  No results for input(s): DDIMER in the last 72 hours.  Cardiac Enzymes No results for input(s): CKMB, TROPONINI, MYOGLOBIN in the last 168 hours.  Invalid input(s): CK ------------------------------------------------------------------------------------------------------------------    Component Value Date/Time   BNP 326.0 (H) 05/06/2019 1552    Micro Results Recent Results (from the past 240 hour(s))  Culture, blood (routine x 2)     Status: Abnormal   Collection Time: 05/10/19  4:26 PM    Specimen: BLOOD LEFT HAND  Result Value Ref Range Status   Specimen Description BLOOD LEFT HAND  Final   Special Requests   Final    BOTTLES DRAWN AEROBIC ONLY Blood Culture adequate volume   Culture  Setup Time   Final    GRAM POSITIVE COCCI AEROBIC BOTTLE ONLY CRITICAL VALUE NOTED.  VALUE IS CONSISTENT WITH PREVIOUSLY REPORTED AND CALLED VALUE. Performed at [facility], [place] [address], [place], [place] [address]    Culture STREPTOCOCCUS GROUP G (A)  Final   Report Status  05/13/2019 FINAL  Final   Organism ID, Bacteria STREPTOCOCCUS GROUP G  Final      Susceptibility   Streptococcus group g - MIC*    CLINDAMYCIN <=0.25 SENSITIVE Sensitive     AMPICILLIN <=0.25 SENSITIVE Sensitive     ERYTHROMYCIN <=0.12 SENSITIVE Sensitive     VANCOMYCIN 0.25 SENSITIVE Sensitive     CEFTRIAXONE <=0.12 SENSITIVE Sensitive     LEVOFLOXACIN 0.5 SENSITIVE Sensitive     PENICILLIN Value in next row Sensitive      SENSITIVE<=0.06    * STREPTOCOCCUS GROUP G  Culture, blood (routine x 2)     Status: Abnormal   Collection Time: 05/10/19  4:26 PM   Specimen: BLOOD LEFT HAND  Result Value Ref Range Status   Specimen Description BLOOD LEFT HAND  Final   Special Requests   Final    BOTTLES DRAWN AEROBIC ONLY Blood Culture adequate volume   Culture  Setup Time   Final    GRAM POSITIVE COCCI AEROBIC BOTTLE ONLY CRITICAL RESULT CALLED TO, READ BACK BY AND VERIFIED WITH: Andres Shad PharmD 7:15 05/11/19 ([email or web address])    Culture (A)  Final    STREPTOCOCCUS GROUP G SUSCEPTIBILITIES PERFORMED ON PREVIOUS CULTURE WITHIN THE LAST 5 DAYS. Performed at [facility], [place] [address] [place], [place] [address]    Report Status 05/13/2019 FINAL  Final  Blood Culture ID Panel (Reflexed)     Status: Abnormal   Collection Time: 05/10/19  4:26 PM  Result Value Ref Range Status   Enterococcus species NOT DETECTED NOT DETECTED Final   Listeria monocytogenes NOT DETECTED NOT DETECTED Final   Staphylococcus  species NOT DETECTED NOT DETECTED Final   Staphylococcus aureus (BCID) NOT DETECTED NOT DETECTED Final   Streptococcus species DETECTED (A) NOT DETECTED Final    Comment: Not Enterococcus species, Streptococcus agalactiae, Streptococcus pyogenes, or Streptococcus pneumoniae. CRITICAL RESULT CALLED TO, READ BACK BY AND VERIFIED WITH: Andres Shad PharmD 7:15 05/11/19 ([email or web address])    Streptococcus agalactiae NOT DETECTED NOT DETECTED Final   Streptococcus pneumoniae NOT DETECTED NOT DETECTED Final   Streptococcus pyogenes NOT DETECTED NOT DETECTED Final   Acinetobacter baumannii NOT DETECTED NOT DETECTED Final   Enterobacteriaceae species NOT DETECTED NOT DETECTED Final   Enterobacter cloacae complex NOT DETECTED NOT DETECTED Final   Escherichia coli NOT DETECTED NOT DETECTED Final   Klebsiella oxytoca NOT DETECTED NOT DETECTED Final   Klebsiella pneumoniae NOT DETECTED NOT DETECTED Final   Proteus species NOT DETECTED NOT DETECTED Final   Serratia marcescens NOT DETECTED NOT DETECTED Final   Haemophilus influenzae NOT DETECTED NOT DETECTED Final   Neisseria meningitidis NOT DETECTED NOT DETECTED Final   Pseudomonas aeruginosa NOT DETECTED NOT DETECTED Final   Candida albicans NOT DETECTED NOT DETECTED Final   Candida glabrata NOT DETECTED NOT DETECTED Final   Candida krusei NOT DETECTED NOT DETECTED Final   Candida parapsilosis NOT DETECTED NOT DETECTED Final   Candida tropicalis NOT DETECTED NOT DETECTED Final    Comment: Performed at [facility], [place]. [address], [place], [place] [address]  Culture, Urine     Status: None   Collection Time: 05/10/19  7:13 PM   Specimen: Urine, Catheterized  Result Value Ref Range Status   Specimen Description URINE, CATHETERIZED  Final   Special Requests NONE  Final   Culture   Final    NO GROWTH Performed at [facility], [address] [place], [place] [address]    Report Status  05/11/2019 FINAL  Final  °Culture, blood (routine x 2)      Status: Abnormal  ° Collection Time: 05/11/19 12:35 PM  ° Specimen: BLOOD  °Result Value Ref Range Status  ° Specimen Description BLOOD RIGHT ANTECUBITAL  Final  ° Special Requests   Final  °  BOTTLES DRAWN AEROBIC ONLY Blood Culture results may not be optimal due to an inadequate volume of blood received in culture bottles  ° Culture  Setup Time   Final  °  GRAM POSITIVE COCCI °AEROBIC BOTTLE ONLY °CRITICAL RESULT CALLED TO, READ BACK BY AND VERIFIED WITH: C. WALSTON PHARMD, AT 1443 05/12/19 BY D. VANHOOK °  ° Culture (A)  Final  °  STAPHYLOCOCCUS SPECIES (COAGULASE NEGATIVE) °THE SIGNIFICANCE OF ISOLATING THIS ORGANISM FROM A SINGLE SET OF BLOOD CULTURES WHEN MULTIPLE SETS ARE DRAWN IS UNCERTAIN. PLEASE NOTIFY THE MICROBIOLOGY DEPARTMENT WITHIN ONE WEEK IF SPECIATION AND SENSITIVITIES ARE REQUIRED. °Performed at [facility], [address] [place], [place] [address] °  ° Report Status 05/13/2019 FINAL  Final  °Culture, blood (routine x 2)     Status: None  ° Collection Time: 05/11/19 12:45 PM  ° Specimen: BLOOD RIGHT HAND  °Result Value Ref Range Status  ° Specimen Description BLOOD RIGHT HAND  Final  ° Special Requests   Final  °  BOTTLES DRAWN AEROBIC ONLY Blood Culture results may not be optimal due to an inadequate volume of blood received in culture bottles  ° Culture   Final  °  NO GROWTH 5 DAYS °Performed at [facility], [address] [place], [place] [address] °  ° Report Status 05/16/2019 FINAL  Final  ° ° °Radiology Reports °Dg Pelvis 1-2 Views ° °Result Date: 05/06/2019 °CLINICAL DATA:  Low back pain after fall. EXAM: PELVIS - 1-2 VIEW COMPARISON:  Pelvic and right hip x-rays dated September 08, 2018. FINDINGS: There is no evidence of pelvic fracture or diastasis. Prior right total hip arthroplasty. Unchanged moderate left hip osteoarthritis. IMPRESSION: No acute osseous abnormality. Electronically Signed   By: William T Derry M.D.   On: 05/06/2019 19:24  ° °Dg Forearm Right ° °Result Date:  05/06/2019 °CLINICAL DATA:  Right forearm pain after fall. EXAM: RIGHT FOREARM - 2 VIEW COMPARISON:  Right wrist and elbow x-rays dated May 16, 2018. FINDINGS: There is no evidence of fracture or other focal bone lesions. Soft tissues are unremarkable. IMPRESSION: Negative. Electronically Signed   By: William T Derry M.D.   On: 05/06/2019 19:13  ° °Dg Abd 1 View ° °Result Date: 05/15/2019 °CLINICAL DATA:  Evaluate for constipation. EXAM: ABDOMEN - 1 VIEW COMPARISON:  CT abdomen pelvis 04/11/2018. FINDINGS: Single AP supine radiograph of the abdomen through the pubic symphysis. The lung bases are excluded from field of view. Gastric distention. There are no dilated loops of bowel to suggest obstruction. Mild stool burden. No supine evidence for free air. Status post right hip arthroplasty, incompletely visualized. IMPRESSION: Gastric distention. Nonobstructive bowel gas pattern. Mild stool burden. Electronically Signed   By: Nancy  Ballantyne M.D.   On: 05/15/2019 18:25  ° °Ct Lumbar Spine Wo Contrast ° °Result Date: 05/11/2019 °CLINICAL DATA:  Lumbar fracture.  Surgical planning. EXAM: CT LUMBAR SPINE WITHOUT CONTRAST TECHNIQUE: Multidetector CT imaging of the lumbar spine was performed without intravenous contrast administration. Multiplanar CT image reconstructions were also generated. COMPARISON:  05/06/2019 FINDINGS: The study is mildly motion degraded despite repeat imaging. Segmentation: 5 lumbar type vertebrae. Alignment: Trace retrolisthesis of   L5 on S1. Vertebrae: Diffuse ankylosis is again noted throughout the lumbar and included lower thoracic spine as well as across both SI joints. A transverse fracture is again seen through the L1-2 disc space including across the bridging osteophytes/syndesmophytes. Posterior element extension was better demonstrated on the prior study due to motion and image noise on today's examination. Anterior widening of the L1-2 disc space is unchanged. No new fracture is  identified. Paraspinal and other soft tissues: Small pleural effusions and dependent atelectasis in the lung bases. Aortic atherosclerosis. Partially visualized right hip arthroplasty. Disc levels: Disc space narrowing greatest at L5-S1 where endplate and facet spurring result in severe bilateral neural foraminal stenosis. IMPRESSION: 1. Lumbar spine CT for surgical planning. 2. Findings suggestive of ankylosing spondylitis with fracture through the L1-2 disc space as previously described. Unchanged anterior disc space widening. 3.  Aortic Atherosclerosis (ICD10-I70.0). Electronically Signed   By: Allen  Grady M.D.   On: 05/11/2019 14:04  ° °Ct Lumbar Spine Wo Contrast ° °Result Date: 05/06/2019 °CLINICAL DATA:  Tender to palpation over the lower lumbar spine, fall EXAM: CT LUMBAR SPINE WITHOUT CONTRAST TECHNIQUE: Multidetector CT imaging of the lumbar spine was performed without intravenous contrast administration. Multiplanar CT image reconstructions were also generated. COMPARISON:  CT April 11, 2018 FINDINGS: Segmentation: There are 5 non-rib bearing lumbar type vertebral bodies with the last intervertebral disc space labeled as L5-S1. Alignment: Normal Vertebrae: There is a fracture seen through the anterior bridging osteophyte of L1 with widening of the anterior inter disc space which measures 1.6 cm. The fracture extends through the right posterior bridging osteophyte and pedicle as well as through the left-sided pedicle and superior articulating facet. There is also a minimally displaced fracture seen at the right-sided inferior corner of the L1 vertebral body. The fracture also extends through the L1 spinous process. There is minimal right-sided facet joint widening seen at L1-L2. No jumped facets however are noted. A small amount of air seen within the inter disc space at L1-L2. No other definite fracture is identified. There are bridging anterior osteophytes seen through the remainder of the lumbar  spine. There inter disc calcifications. There is findings of of diffuse enthesopathy seen between the spinous processes in the lumbar spine. Paraspinal and other soft tissues: Mild prevertebral soft tissue swelling is seen at L1-L2. No large hematoma however is noted. The sacroiliac joints are intact. There is diffuse ankylosis seen around the bilateral sacroiliac joints. Scattered aortic atherosclerosis is noted. Disc levels: Degenerative changes are seen throughout the lumbar spine with disc height loss and facet arthropathy which is most notable at L5-S1 with severe bilateral neural foraminal narrowing. IMPRESSION: 1. Fracture through the anterior bridging osteophyte of L1-2 with widening of the inter disc space . The fracture extends through the posterior columns at L1, as described above through the pedicles, facet, and spinous process. 2. Mild prevertebral soft tissue swelling is seen at this level. 3. Findings suggestive of ankylosing spondylitis. 4.  Aortic Atherosclerosis (ICD10-I70.0). These results were called by telephone at the time of interpretation on 05/06/2019 at 4:56 pm to provider RICHARD DYKSTRA , who verbally acknowledged these results. Electronically Signed   By: Bindu  Avutu M.D.   On: 05/06/2019 17:02  ° °Dg Chest Port 1 View ° °Result Date: 05/18/2019 °CLINICAL DATA:  Pneumonia. EXAM: PORTABLE CHEST 1 VIEW COMPARISON:  May 15, 2019. FINDINGS: Stable cardiomegaly. No pneumothorax or pleural effusion is noted. Right-sided PICC line is noted with distal tip in expected position of   cavoatrial junction. No acute pulmonary disease is noted. Bony thorax is unremarkable. IMPRESSION: Right-sided PICC line is been repositioned with tip in expected position of cavoatrial junction. No acute cardiopulmonary abnormality seen. Electronically Signed   By: Marijo Conception M.D.   On: 05/18/2019 10:52   Dg Chest Port 1 View  Result Date: 05/15/2019 CLINICAL DATA:  68 year old male with a history of  PICC placement EXAM: PORTABLE CHEST 1 VIEW COMPARISON:  None. FINDINGS: Cardiomediastinal silhouette unchanged in size and contour with surgical changes of median sternotomy and cardiomegaly. No pneumothorax. No pleural effusion. Coarsened interstitial markings throughout. Right upper extremity PICC is redundant, without visualization of the point of termination of the tip. Redemonstration of partially healed right-sided rib fractures IMPRESSION: Right upper extremity PICC is redundant within the veins in the tip is not visualized. Repositioning is indicated. Similar appearance of the lungs with diffuse reticulonodular opacities. Similar appearance of surgical changes of median sternotomy and cardiomegaly. Electronically Signed   By: Corrie Mckusick D.O.   On: 05/15/2019 11:13   Dg Chest Port 1 View  Result Date: 05/10/2019 CLINICAL DATA:  68 year old male positive for COVID-19. Fever. EXAM: PORTABLE CHEST 1 VIEW COMPARISON:  Portable chest 05/06/2019 and earlier. FINDINGS: Portable AP semi upright view at 1645 hours. Chronic cardiomegaly. Mildly lower lung volumes compared to October. New bilateral basilar predominant confluent and indistinct pulmonary opacity. No superimposed pneumothorax or pleural effusion. Prior sternotomy. No acute osseous abnormality identified. IMPRESSION: 1. New from 05/06/2019 bilateral basilar predominant bilateral pulmonary opacity compatible with COVID-19 pneumonia. 2. Chronic cardiomegaly. Electronically Signed   By: Genevie Ann M.D.   On: 05/10/2019 17:04   Dg Chest Portable 1 View  Result Date: 05/06/2019 CLINICAL DATA:  Fall. EXAM: PORTABLE CHEST 1 VIEW COMPARISON:  Chest x-ray dated January 13, 2019. FINDINGS: Stable cardiomegaly. Pulmonary vascular congestion. No focal consolidation, pleural effusion, or pneumothorax. No acute osseous abnormality. Old right-sided rib fractures again noted. IMPRESSION: Cardiomegaly with pulmonary vascular congestion. Electronically Signed   By:  Titus Dubin M.D.   On: 05/06/2019 19:11   Vas [place] Lower Extremity Venous (dvt)  Result Date: 05/08/2019  Lower Venous Study Indications: Covid positive, elevated D-Dimer. Has unstable lumbar fracture.  Limitations: Patient in severe pain with compression and body habitus. Comparison Study: No prior study on file for comparison Performing Technologist: Sharion Dove RVS  Examination Guidelines: A complete evaluation includes B-mode imaging, spectral Doppler, color Doppler, and power Doppler as needed of all accessible portions of each vessel. Bilateral testing is considered an integral part of a complete examination. Limited examinations for reoccurring indications may be performed as noted.  +---------+---------------+---------+-----------+----------+--------------+  RIGHT     Compressibility Phasicity Spontaneity Properties Thrombus Aging  +---------+---------------+---------+-----------+----------+--------------+  CFV       Full            Yes       Yes                                    +---------+---------------+---------+-----------+----------+--------------+  SFJ       Full                                                             +---------+---------------+---------+-----------+----------+--------------+  FV Prox   Full                                                             +---------+---------------+---------+-----------+----------+--------------+  FV Mid    Full                                                             +---------+---------------+---------+-----------+----------+--------------+  FV Distal Full                                                             +---------+---------------+---------+-----------+----------+--------------+  PFV       Full                                                             +---------+---------------+---------+-----------+----------+--------------+  POP       Full            Yes       Yes                                     +---------+---------------+---------+-----------+----------+--------------+  PTV       Full                                                             +---------+---------------+---------+-----------+----------+--------------+  PERO      Full                                                             +---------+---------------+---------+-----------+----------+--------------+   +---------+---------------+---------+-----------+----------+--------------+  LEFT      Compressibility Phasicity Spontaneity Properties Thrombus Aging  +---------+---------------+---------+-----------+----------+--------------+  CFV       Full            Yes       Yes                                    +---------+---------------+---------+-----------+----------+--------------+  SFJ       Full                                                             +---------+---------------+---------+-----------+----------+--------------+  FV Prox   Full                                                             +---------+---------------+---------+-----------+----------+--------------+  FV Mid    Full                                                             +---------+---------------+---------+-----------+----------+--------------+  FV Distal Full                                                             +---------+---------------+---------+-----------+----------+--------------+  PFV       Full                                                             +---------+---------------+---------+-----------+----------+--------------+  POP       Full            Yes       Yes                                    +---------+---------------+---------+-----------+----------+--------------+  PTV       Full                                                             +---------+---------------+---------+-----------+----------+--------------+  PERO      Full                                                              +---------+---------------+---------+-----------+----------+--------------+     Summary: Right: There is no evidence of deep vein thrombosis in the lower extremity. Left: There is no evidence of deep vein thrombosis in the lower extremity.  *See table(s) above for measurements and observations. Electronically signed by Deitra Mayo MD on 05/08/2019 at 5:56:44 PM.    Final    Ir Picc Placement Right >5 Yrs Inc Img Guide  Result Date: 05/17/2019 INDICATION: BACTEREMIA, ACCESS FOR LONG-TERM ANTIBIOTICS EXAM: ULTRASOUND AND FLUOROSCOPIC GUIDED PICC LINE INSERTION MEDICATIONS: 1% LIDOCAINE LOCAL CONTRAST:  None FLUOROSCOPY TIME:  120 seconds (12 mGy) COMPLICATIONS: None immediate. TECHNIQUE: The procedure, risks, benefits, and alternatives were explained to the patient's family and informed written consent was obtained. A timeout was performed prior to the  initiation of the procedure. The right upper extremity was prepped with chlorhexidine in a sterile fashion, and a sterile drape was applied covering the operative field. Maximum barrier sterile technique with sterile gowns and gloves were used for the procedure. A timeout was performed prior to the initiation of the procedure. Local anesthesia was provided with 1% lidocaine. Under direct ultrasound guidance, the right brachial vein was accessed with a micropuncture kit after the overlying soft tissues were anesthetized with 1% lidocaine. An ultrasound image was saved for documentation purposes. A guidewire was advanced to the level of the superior caval-atrial junction for measurement purposes and the PICC line was cut to length. A peel-away sheath was placed and a 45 cm, 5 [place], dual lumen was inserted to level of the superior caval-atrial junction. A post procedure spot fluoroscopic was obtained. The catheter easily aspirated and flushed and was sutured in place. A dressing was placed. The patient tolerated the procedure well without immediate post  procedural complication. FINDINGS: After catheter placement, the tip lies within the superior cavoatrial junction. The catheter aspirates and flushes normally and is ready for immediate use. IMPRESSION: Successful ultrasound and fluoroscopic guided placement of a right brachial vein approach, 45 cm, 5 French, dual lumen PICC with tip at the superior caval-atrial junction. The PICC line is ready for immediate use. Electronically Signed   By: Jerilynn Mages.  Shick M.D.   On: 05/17/2019 16:35   [place] Ekg Site Rite  Result Date: 05/14/2019 If Site Rite image not attached, placement could not be confirmed due to current cardiac rhythm.    Time Spent in minutes  30     Desiree Hane M.D on 05/19/2019 at 6:08 PM  To page go to [email or web address] - password [email or web address]

## 2019-05-19 NOTE — Progress Notes (Signed)
    [facility] has been requested to perform a transesophageal echocardiogram on Graydon Miyamoto for bacteremia.  After careful review of history and examination, the risks and benefits of transesophageal echocardiogram have been explained including risks of esophageal damage, perforation (1:10,000 risk), bleeding, pharyngeal hematoma as well as other potential complications associated with conscious sedation including aspiration, arrhythmia, respiratory failure and death. Alternatives to treatment were discussed, questions were answered. Patient is willing to proceed. Patent is COVID positive.   Kale Dols Ninfa Meeker, PA-C  05/19/2019 4:46 PM

## 2019-05-19 NOTE — Progress Notes (Signed)
Patient continues to decline meals. States "not hungry". Difficulty in swallowing meds evidenced by facial grimaces and cough while swallowing.  Prefers taking 1 pill at a time. Denies sore throat; MD Aware. Swallow Eval ordered/ pending

## 2019-05-19 NOTE — Progress Notes (Signed)
ANTICOAGULATION CONSULT NOTE  Pharmacy Consult for Heparin Indication: atrial fibrillation   Patient Measurements: Height: 5\' 9"  (175.3 cm) Weight: 267 lb (121.1 kg) IBW/kg (Calculated) : 70.7 Heparin Dosing Weight: 98 kg  Vital Signs: Temp: 98.2 F (36.8 C) (11/11 0759) Temp Source: Oral (11/11 0759) BP: 110/63 (11/11 0929) Pulse Rate: 80 (11/11 0929)  Labs: Recent Labs    05/17/19 0521 05/18/19 0500 05/19/19 0942  HGB 12.4* 11.3* 10.5*  HCT 38.4* 35.4* 32.0*  PLT 114* 123* 159  HEPARINUNFRC 0.70 0.61 0.36  CREATININE 0.79  --  0.91     Assessment: 68 year old male on Xarelto prior to admission for atrial fibrillation who presented post fall and was transitioned to Heparin IV due to need for possible lumbar surgery.   Heparin level is therapeutic (trended down but still well within range) on current rate of 2000 units/hr.  Hemoglobin is trending down slowly at 10.5. Platelets improved up to 159..  No bleeding reported.    Goal of Therapy:  Heparin level 0.3-0.7 units/ml Monitor platelets by anticoagulation protocol: Yes    Plan:  - Continue Heparin at 2000 units/hr - Daily HL, CBC - Follow-up TEE on Thursday  Sloan Leiter, PharmD, BCPS, BCCCP Clinical Pharmacist Please refer to [facility] for [place] numbers 05/19/2019 11:02 AM

## 2019-05-19 NOTE — Progress Notes (Signed)
Nutrition Follow-up  DOCUMENTATION CODES:   Obesity unspecified  INTERVENTION:   -Ensure MAX Protein po BID, each supplement provides 150 kcal and 30 grams of protein -Multivitamin with minerals daily -Recommend measure new weight for admission (last recorded 10/29)  NUTRITION DIAGNOSIS:   Increased nutrient needs related to wound healing, acute illness as evidenced by estimated needs.  Ongoing.  GOAL:   Patient will meet greater than or equal to 90% of their needs  Progressing.  MONITOR:   PO intake, Supplement acceptance, Labs, Weight trends, I & O's, Skin  ASSESSMENT:   68 y.o. 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  Patient fell onto his buttock.Patient is being admitted for further management of lumbar fracture. 10/29: admitted, COVID-19 positive  **RD working remotely**  Patient awaiting TEE 11/12. Pt did not eat well yesterday given worsening pain, refused breakfast and lunch. Per documentation today, pt is drinking fluids mainly. Pt accepting of supplements now.  Pt no longer on precautions for COVID-19, given asymptomatic.   Admission weight: 267 lbs. No new weights have been recorded.  I/Os: -9.2L since admit UOP: 1975 ml x 24 hrs  Medications: IV Lasix, Multivitamin with minerals daily, Vitamin C tablet, Zinc sulfate capsule  Labs reviewed: CBGs: 183-213 Low Na Elevated K  Diet Order:   Diet Order            Diet heart healthy/carb modified Room service appropriate? Yes; Fluid consistency: Thin  Diet effective now              EDUCATION NEEDS:   No education needs have been identified at this time  Skin:  Skin Assessment: Skin Integrity Issues: Skin Integrity Issues:: Stage II Stage II: right buttocks  Last BM:  11/8 -type 6  Height:   Ht  Readings from Last 1 Encounters:  05/06/19 5\' 9"  (1.753 m)    Weight:   Wt Readings from Last 1 Encounters:  05/06/19 121.1 kg    Ideal Body Weight:  72.7 kg  BMI:  Body mass index is 39.43 kg/m.  Estimated Nutritional Needs:   Kcal:  2100-2300  Protein:  100-110g  Fluid:  2.1L/day  [name] Bibles, MS, RD, LDN Inpatient Clinical Dietitian Pager: [phone] After Hours Pager: [phone]

## 2019-05-19 NOTE — H&P (View-Only) (Signed)
[facility]  PROGRESS NOTE  Cody Hale EHU:[id number] DOB: 1950-09-02 DOA: 05/06/2019 PCP: Cody Lima, MD Admit date - 05/06/2019   Admitting Physician Cody Friendly, MD  Outpatient Primary MD for the patient is Cody Lima, MD  LOS - 19 Brief Narrative   Cody Hale is a 68 y.o. 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  Hospital course complicated by [id number] infection positive with no symptoms though he did complete 5 days of remdesivir and full course of heparin drip GBS bacteremia IV penicillin, currently awaiting TEE  Subjective  FNAME@ [place] today has,some difficulty swallowing and productive cough. No fevers or chills. No nausea. Minimal appetite. Persistent back pain  A & P   1. Unstable L1-L2 fracture. 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  Neurosurgery plans for operative stabilization pending resolution of infectious process addressed in #2, continue conservative management with TLSO bracing and bed rest  2. GBS bacteremia with history of mitral prosthetic valve, stable. On penicillin and remains afebrile, white count trending down and no recurrent bacteremia. Pending TEE on 05/20/19 given high concern for endocarditis with history of prosthetic valve-if positive will need to add gentamicin and consult CT surgery  3. COVID 19 pneumonia. Repeat CXR shows resolution of consolidation/opacities. Ferritin, CRP are increasing Currently on 3 L with normal oxygen saturation.  Exam continue vitamin C and zinc, continue to monitor inflammatory  markers.  4. Hyponatremia with normal serum osm ( 281), slightly worsening.  Sodium currently 128 after repeat ( nadir of 127 in 24 hours), previously 129-132 in days prior.  Serum osm within normal limits.  Glucose within normal limits, no hyperproteinemia, check LDL, would expect hypotonic hyponatremia in the setting of diuresis.  Continue to monitor BMP.  5. Hyperkalemia, resolved.  Peak of 5.3, improved after Lokelma x1.  Not on any potassium supplementation, monitor BMP.  6. Concern for swallowing difficulty.  Patient denies however noticed grimacing in difficulty with swallowing pills.  Speech eval ordered.  7. CHF with reduced EF, mild exacerbation.  1.3 L out in last 24 hours, net -800 L.  No overt peripheral edema, stable O2, difficult to assess for crackles but recent chest x-ray shows no lung involvement.  It is possible hyponatremia related to some mild hypervolemia, continue to monitor, if no improvement may need to repeat TTE, continue fluid restriction, daily weights, monitoring BMP continue Coreg, IV Lasix (on torsemide 20 mg twice daily, home regimen) started last 24 hours related to crackles on exam and hyponatremia.  8. Chronic atrial fibrillation, rate controlled.  Holding home Xarelto, continue heparin drip given will likely need neurosurgical intervention once infection status stable.  9. Type 2 diabetes, continue Lantus 25 units CBGs at goal (reduced from home regimen of Tresiba 50 units daily), NovoLog scheduled 8 units 3 times daily, sliding scale as needed.  Holding home Hall  10. Thrombocytopenia, resolved.  Currently 10 mg, no active signs of bleeding, monitor CBC.   11. GERD, stable.  Continue ProtonixDiminished appetite.  Continue Ensure twice daily  12. COPD, stable.  Continue Incruse.     Family Communication  : Spoke with son Cody Hale  at [phone] on 05/19/19  Code Status :  FULL  Disposition Plan  : Continue inpatient stay, requires TEE to out  infective endocarditis, monitor electrolytes  Consults  : ID, neurosurgery, cardiology Procedures  : 10/31 venous duplex TEE, 05/1119  DVT Prophylaxis  : IV Heparin  Lab Results  Component Value Date   PLT 159 05/19/2019    Diet :  Diet Order            Diet NPO time specified  Diet effective midnight        Diet heart healthy/carb modified Room service appropriate? Yes; Fluid consistency: Thin  Diet effective now               Inpatient Medications Scheduled Meds:  carvedilol  3.125 mg Oral BID   Chlorhexidine Gluconate Cloth  6 each Topical Daily   docusate sodium  100 mg Oral Daily   fluticasone  2 spray Each Nare Daily   fluticasone furoate-vilanterol  1 puff Inhalation Daily   furosemide  40 mg Intravenous Daily   insulin aspart  0-5 Units Subcutaneous QHS   insulin aspart  0-9 Units Subcutaneous TID WC   insulin aspart  8 Units Subcutaneous TID WC   insulin glargine  45 Units Subcutaneous Daily   loratadine  10 mg Oral QPM   multivitamin with minerals  1 tablet Oral Daily   pantoprazole  40 mg Oral Daily   polyethylene glycol  17 g Oral Daily   Ensure Max Protein  11 oz Oral BID   senna-docusate  2 tablet Oral BID   umeclidinium bromide  1 puff Inhalation Daily   vitamin C  500 mg Oral Daily   zinc sulfate  220 mg Oral Daily   Continuous Infusions:  heparin 2,000 Units/hr (05/19/19 1529)   penicillin g continuous IV infusion 41.7 mL/hr at 05/19/19 1010   PRN Meds:.acetaminophen **OR** acetaminophen, chlorpheniramine-HYDROcodone, guaiFENesin-dextromethorphan, hydrALAZINE, lidocaine (PF), metoprolol tartrate, morphine injection, nitroGLYCERIN, ondansetron **OR** ondansetron (ZOFRAN) IV, oxyCODONE  Antibiotics  :   Anti-infectives (From admission, onward)   Start     Dose/Rate Route Frequency Ordered Stop   05/15/19 1430  penicillin G potassium 12 Million Units in dextrose 5 % 500 mL continuous infusion     12 Million Units 41.7 mL/hr  over 12 Hours Intravenous Every 12 hours 05/15/19 1343     05/13/19 1000  penicillin G potassium 12 Million Units in dextrose 5 % 500 mL continuous infusion  Status:  Discontinued     12 Million Units 41.7 mL/hr over 12 Hours Intravenous Every 12 hours 05/12/19 1159 05/15/19 1343   05/11/19 0730  cefTRIAXone (ROCEPHIN) 2 g in sodium chloride 0.9 % 100 mL IVPB  Status:  Discontinued     2 g 200 mL/hr over 30 Minutes Intravenous Every 24 hours 05/11/19 0721 05/12/19 1159   05/08/19 0200  remdesivir 100 mg in sodium chloride 0.9 % 250 mL IVPB     100 mg 500 mL/hr over 30 Minutes Intravenous Every 24 hours 05/07/19 0059 05/11/19 0513   05/07/19 0100  remdesivir 200 mg in sodium chloride 0.9 % 250 mL IVPB     200 mg 500 mL/hr over 30 Minutes Intravenous Once 05/07/19 0057 05/07/19 0512       Objective   Vitals:   05/19/19 0759 05/19/19 0806 05/19/19 0929 05/19/19 1707  BP: (!) 94/54  110/63 114/60  Pulse: 88 75 80 67  Resp: _0 Temp: 98.2 F (36.8 C)  99.5 F (37.5 C)  TempSrc: Oral   Oral  SpO2: 100% 98%  99%  Weight:      Height:        SpO2: 99 % O2 Flow Rate (L/min): 3 L/min FiO2 (%): 32 %  Wt Readings from Last 3 Encounters:  05/06/19 121.1 kg  03/24/19 120.7 kg  02/16/19 117.9 kg     Intake/Output Summary (Last 24 hours) at 05/19/2019 1808 Last data filed at 05/19/2019 1131 Gross per 24 hour  Intake 240 ml  Output 650 ml  Net -410 ml    Physical Exam:  Awake Alert, Oriented X 3, Normal affect No new F.N deficits,  [place].AT, No JVD appreciated Symmetrical Chest wall movement, Good air movement bilaterally, no respiratory distress on 3 L RRR,No Gallops,Rubs or new Murmurs,  +ve B.Sounds, Abd Soft, No tenderness, No rebound, guarding or rigidity. No Cyanosis, Clubbing or edema,  No new Rash or bruise     I have personally reviewed the following:   Data Reviewed:  CBC Recent Labs  Lab 05/15/19 0334 05/16/19 0354 05/17/19 0521 05/18/19 0500  05/19/19 0942  WBC 9.0 12.8* 13.5* 20.8* 13.5*  HGB 13.1 13.6 12.4* 11.3* 10.5*  HCT 40.8 41.3 38.4* 35.4* 32.0*  PLT 83* 99* 114* 123* 159  MCV 85.9 84.8 84.6 86.8 86.0  MCH 27.6 27.9 27.3 27.7 28.2  MCHC 32.1 32.9 32.3 31.9 32.8  RDW 15.3 15.3 15.1 15.3 15.4    Chemistries  Recent Labs  Lab 05/15/19 0334 05/17/19 0521 05/19/19 0942 05/19/19 1516  NA 132* 129* 127* 128*  K 5.3* 4.6 5.3* 4.9  CL 99 96* 93* 94*  CO2 _0 GLUCOSE 283* 194* 207* 182*  BUN _1 CREATININE 0.80 0.79 0.91 0.88  CALCIUM 8.3* 8.1* 7.9* 7.7*  AST 15 20 41  --   ALT _2 --   ALKPHOS 64 62 65  --   BILITOT 1.6* 1.1 1.2  --    ------------------------------------------------------------------------------------------------------------------ No results for input(s): CHOL, HDL, LDLCALC, TRIG, CHOLHDL, LDLDIRECT in the last 72 hours.  Lab Results  Component Value Date   HGBA1C 7.4 (H) 03/24/2019   ------------------------------------------------------------------------------------------------------------------ No results for input(s): TSH, T4TOTAL, T3FREE, THYROIDAB in the last 72 hours.  Invalid input(s): FREET3 ------------------------------------------------------------------------------------------------------------------ Recent Labs    05/18/19 0500 05/19/19 0942  FERRITIN 269 287    Coagulation profile No results for input(s): INR, PROTIME in the last 168 hours.  No results for input(s): DDIMER in the last 72 hours.  Cardiac Enzymes No results for input(s): CKMB, TROPONINI, MYOGLOBIN in the last 168 hours.  Invalid input(s): CK ------------------------------------------------------------------------------------------------------------------    Component Value Date/Time   BNP 326.0 (H) 05/06/2019 1552    Micro Results Recent Results (from the past 240 hour(s))  Culture, blood (routine x 2)     Status: Abnormal   Collection Time: 05/10/19  4:26 PM    Specimen: BLOOD LEFT HAND  Result Value Ref Range Status   Specimen Description BLOOD LEFT HAND  Final   Special Requests   Final    BOTTLES DRAWN AEROBIC ONLY Blood Culture adequate volume   Culture  Setup Time   Final    GRAM POSITIVE COCCI AEROBIC BOTTLE ONLY CRITICAL VALUE NOTED.  VALUE IS CONSISTENT WITH PREVIOUSLY REPORTED AND CALLED VALUE. Performed at [facility], [place] [address], [place], [place] [address]    Culture STREPTOCOCCUS GROUP G (A)  Final   Report Status  05/13/2019 FINAL  Final   Organism ID, Bacteria STREPTOCOCCUS GROUP G  Final      Susceptibility   Streptococcus group g - MIC*    CLINDAMYCIN <=0.25 SENSITIVE Sensitive     AMPICILLIN <=0.25 SENSITIVE Sensitive     ERYTHROMYCIN <=0.12 SENSITIVE Sensitive     VANCOMYCIN 0.25 SENSITIVE Sensitive     CEFTRIAXONE <=0.12 SENSITIVE Sensitive     LEVOFLOXACIN 0.5 SENSITIVE Sensitive     PENICILLIN Value in next row Sensitive      SENSITIVE<=0.06    * STREPTOCOCCUS GROUP G  Culture, blood (routine x 2)     Status: Abnormal   Collection Time: 05/10/19  4:26 PM   Specimen: BLOOD LEFT HAND  Result Value Ref Range Status   Specimen Description BLOOD LEFT HAND  Final   Special Requests   Final    BOTTLES DRAWN AEROBIC ONLY Blood Culture adequate volume   Culture  Setup Time   Final    GRAM POSITIVE COCCI AEROBIC BOTTLE ONLY CRITICAL RESULT CALLED TO, READ BACK BY AND VERIFIED WITH: Andres Shad PharmD 7:15 05/11/19 ([email or web address])    Culture (A)  Final    STREPTOCOCCUS GROUP G SUSCEPTIBILITIES PERFORMED ON PREVIOUS CULTURE WITHIN THE LAST 5 DAYS. Performed at [facility], [place] [address] [place], [place] [address]    Report Status 05/13/2019 FINAL  Final  Blood Culture ID Panel (Reflexed)     Status: Abnormal   Collection Time: 05/10/19  4:26 PM  Result Value Ref Range Status   Enterococcus species NOT DETECTED NOT DETECTED Final   Listeria monocytogenes NOT DETECTED NOT DETECTED Final   Staphylococcus  species NOT DETECTED NOT DETECTED Final   Staphylococcus aureus (BCID) NOT DETECTED NOT DETECTED Final   Streptococcus species DETECTED (A) NOT DETECTED Final    Comment: Not Enterococcus species, Streptococcus agalactiae, Streptococcus pyogenes, or Streptococcus pneumoniae. CRITICAL RESULT CALLED TO, READ BACK BY AND VERIFIED WITH: Andres Shad PharmD 7:15 05/11/19 ([email or web address])    Streptococcus agalactiae NOT DETECTED NOT DETECTED Final   Streptococcus pneumoniae NOT DETECTED NOT DETECTED Final   Streptococcus pyogenes NOT DETECTED NOT DETECTED Final   Acinetobacter baumannii NOT DETECTED NOT DETECTED Final   Enterobacteriaceae species NOT DETECTED NOT DETECTED Final   Enterobacter cloacae complex NOT DETECTED NOT DETECTED Final   Escherichia coli NOT DETECTED NOT DETECTED Final   Klebsiella oxytoca NOT DETECTED NOT DETECTED Final   Klebsiella pneumoniae NOT DETECTED NOT DETECTED Final   Proteus species NOT DETECTED NOT DETECTED Final   Serratia marcescens NOT DETECTED NOT DETECTED Final   Haemophilus influenzae NOT DETECTED NOT DETECTED Final   Neisseria meningitidis NOT DETECTED NOT DETECTED Final   Pseudomonas aeruginosa NOT DETECTED NOT DETECTED Final   Candida albicans NOT DETECTED NOT DETECTED Final   Candida glabrata NOT DETECTED NOT DETECTED Final   Candida krusei NOT DETECTED NOT DETECTED Final   Candida parapsilosis NOT DETECTED NOT DETECTED Final   Candida tropicalis NOT DETECTED NOT DETECTED Final    Comment: Performed at [facility], [place]. [address], [place], [place] [address]  Culture, Urine     Status: None   Collection Time: 05/10/19  7:13 PM   Specimen: Urine, Catheterized  Result Value Ref Range Status   Specimen Description URINE, CATHETERIZED  Final   Special Requests NONE  Final   Culture   Final    NO GROWTH Performed at [facility], [address] [place],  [address]    Report Status  05/11/2019 FINAL  Final  Culture, blood (routine x 2)      Status: Abnormal   Collection Time: 05/11/19 12:35 PM   Specimen: BLOOD  Result Value Ref Range Status   Specimen Description BLOOD RIGHT ANTECUBITAL  Final   Special Requests   Final    BOTTLES DRAWN AEROBIC ONLY Blood Culture results may not be optimal due to an inadequate volume of blood received in culture bottles   Culture  Setup Time   Final    GRAM POSITIVE COCCI AEROBIC BOTTLE ONLY CRITICAL RESULT CALLED TO, READ BACK BY AND VERIFIED WITH: C. WALSTON PHARMD, AT [phone] 05/12/19 BY D. VANHOOK    Culture (A)  Final    STAPHYLOCOCCUS SPECIES (COAGULASE NEGATIVE) THE SIGNIFICANCE OF ISOLATING THIS ORGANISM FROM A SINGLE SET OF BLOOD CULTURES WHEN MULTIPLE SETS ARE DRAWN IS UNCERTAIN. PLEASE NOTIFY THE MICROBIOLOGY DEPARTMENT WITHIN ONE WEEK IF SPECIATION AND SENSITIVITIES ARE REQUIRED. Performed at [facility], [place] [address] [place], [place] [address]    Report Status 05/13/2019 FINAL  Final  Culture, blood (routine x 2)     Status: None   Collection Time: 05/11/19 12:45 PM   Specimen: BLOOD RIGHT HAND  Result Value Ref Range Status   Specimen Description BLOOD RIGHT HAND  Final   Special Requests   Final    BOTTLES DRAWN AEROBIC ONLY Blood Culture results may not be optimal due to an inadequate volume of blood received in culture bottles   Culture   Final    NO GROWTH 5 DAYS Performed at [facility], [place] [address] [place], [place] [address]    Report Status 05/16/2019 FINAL  Final    Radiology Reports Dg Pelvis 1-2 Views  Result Date: 05/06/2019 CLINICAL DATA:  Low back pain after fall. EXAM: PELVIS - 1-2 VIEW COMPARISON:  Pelvic and right hip x-rays dated September 08, 2018. FINDINGS: There is no evidence of pelvic fracture or diastasis. Prior right total hip arthroplasty. Unchanged moderate left hip osteoarthritis. IMPRESSION: No acute osseous abnormality. Electronically Signed   By: Titus Dubin M.D.   On: 05/06/2019 19:24   Dg Forearm Right  Result Date:  05/06/2019 CLINICAL DATA:  Right forearm pain after fall. EXAM: RIGHT FOREARM - 2 VIEW COMPARISON:  Right wrist and elbow x-rays dated May 16, 2018. FINDINGS: There is no evidence of fracture or other focal bone lesions. Soft tissues are unremarkable. IMPRESSION: Negative. Electronically Signed   By: Titus Dubin M.D.   On: 05/06/2019 19:13   Dg Abd 1 View  Result Date: 05/15/2019 CLINICAL DATA:  Evaluate for constipation. EXAM: ABDOMEN - 1 VIEW COMPARISON:  CT abdomen pelvis 04/11/2018. FINDINGS: Single AP supine radiograph of the abdomen through the pubic symphysis. The lung bases are excluded from field of view. Gastric distention. There are no dilated loops of bowel to suggest obstruction. Mild stool burden. No supine evidence for free air. Status post right hip arthroplasty, incompletely visualized. IMPRESSION: Gastric distention. Nonobstructive bowel gas pattern. Mild stool burden. Electronically Signed   By: Audie Pinto M.D.   On: 05/15/2019 18:25   Ct Lumbar Spine Wo Contrast  Result Date: 05/11/2019 CLINICAL DATA:  Lumbar fracture.  Surgical planning. EXAM: CT LUMBAR SPINE WITHOUT CONTRAST TECHNIQUE: Multidetector CT imaging of the lumbar spine was performed without intravenous contrast administration. Multiplanar CT image reconstructions were also generated. COMPARISON:  05/06/2019 FINDINGS: The study is mildly motion degraded despite repeat imaging. Segmentation: 5 lumbar type vertebrae. Alignment: Trace retrolisthesis of  L5 on S1. Vertebrae: Diffuse ankylosis is again noted throughout the lumbar and included lower thoracic spine as well as across both SI joints. A transverse fracture is again seen through the L1-2 disc space including across the bridging osteophytes/syndesmophytes. Posterior element extension was better demonstrated on the prior study due to motion and image noise on today's examination. Anterior widening of the L1-2 disc space is unchanged. No new fracture is  identified. Paraspinal and other soft tissues: Small pleural effusions and dependent atelectasis in the lung bases. Aortic atherosclerosis. Partially visualized right hip arthroplasty. Disc levels: Disc space narrowing greatest at L5-S1 where endplate and facet spurring result in severe bilateral neural foraminal stenosis. IMPRESSION: 1. Lumbar spine CT for surgical planning. 2. Findings suggestive of ankylosing spondylitis with fracture through the L1-2 disc space as previously described. Unchanged anterior disc space widening. 3.  Aortic Atherosclerosis (ICD10-I70.0). Electronically Signed   By: Logan Bores M.D.   On: 05/11/2019 14:04   Ct Lumbar Spine Wo Contrast  Result Date: 05/06/2019 CLINICAL DATA:  Tender to palpation over the lower lumbar spine, fall EXAM: CT LUMBAR SPINE WITHOUT CONTRAST TECHNIQUE: Multidetector CT imaging of the lumbar spine was performed without intravenous contrast administration. Multiplanar CT image reconstructions were also generated. COMPARISON:  CT April 11, 2018 FINDINGS: Segmentation: There are 5 non-rib bearing lumbar type vertebral bodies with the last intervertebral disc space labeled as L5-S1. Alignment: Normal Vertebrae: There is a fracture seen through the anterior bridging osteophyte of L1 with widening of the anterior inter disc space which measures 1.6 cm. The fracture extends through the right posterior bridging osteophyte and pedicle as well as through the left-sided pedicle and superior articulating facet. There is also a minimally displaced fracture seen at the right-sided inferior corner of the L1 vertebral body. The fracture also extends through the L1 spinous process. There is minimal right-sided facet joint widening seen at L1-L2. No jumped facets however are noted. A small amount of air seen within the inter disc space at L1-L2. No other definite fracture is identified. There are bridging anterior osteophytes seen through the remainder of the lumbar  spine. There inter disc calcifications. There is findings of of diffuse enthesopathy seen between the spinous processes in the lumbar spine. Paraspinal and other soft tissues: Mild prevertebral soft tissue swelling is seen at L1-L2. No large hematoma however is noted. The sacroiliac joints are intact. There is diffuse ankylosis seen around the bilateral sacroiliac joints. Scattered aortic atherosclerosis is noted. Disc levels: Degenerative changes are seen throughout the lumbar spine with disc height loss and facet arthropathy which is most notable at L5-S1 with severe bilateral neural foraminal narrowing. IMPRESSION: 1. Fracture through the anterior bridging osteophyte of L1-2 with widening of the inter disc space . The fracture extends through the posterior columns at L1, as described above through the pedicles, facet, and spinous process. 2. Mild prevertebral soft tissue swelling is seen at this level. 3. Findings suggestive of ankylosing spondylitis. 4.  Aortic Atherosclerosis (ICD10-I70.0). These results were called by telephone at the time of interpretation on 05/06/2019 at 4:56 pm to provider [facility] , who verbally acknowledged these results. Electronically Signed   By: Prudencio Pair M.D.   On: 05/06/2019 17:02   Dg Chest Port 1 View  Result Date: 05/18/2019 CLINICAL DATA:  Pneumonia. EXAM: PORTABLE CHEST 1 VIEW COMPARISON:  May 15, 2019. FINDINGS: Stable cardiomegaly. No pneumothorax or pleural effusion is noted. Right-sided PICC line is noted with distal tip in expected position of  cavoatrial junction. No acute pulmonary disease is noted. Bony thorax is unremarkable. IMPRESSION: Right-sided PICC line is been repositioned with tip in expected position of cavoatrial junction. No acute cardiopulmonary abnormality seen. Electronically Signed   By: Marijo Conception M.D.   On: 05/18/2019 10:52   Dg Chest Port 1 View  Result Date: 05/15/2019 CLINICAL DATA:  68 year old male with a history of  PICC placement EXAM: PORTABLE CHEST 1 VIEW COMPARISON:  None. FINDINGS: Cardiomediastinal silhouette unchanged in size and contour with surgical changes of median sternotomy and cardiomegaly. No pneumothorax. No pleural effusion. Coarsened interstitial markings throughout. Right upper extremity PICC is redundant, without visualization of the point of termination of the tip. Redemonstration of partially healed right-sided rib fractures IMPRESSION: Right upper extremity PICC is redundant within the veins in the tip is not visualized. Repositioning is indicated. Similar appearance of the lungs with diffuse reticulonodular opacities. Similar appearance of surgical changes of median sternotomy and cardiomegaly. Electronically Signed   By: Corrie Mckusick D.O.   On: 05/15/2019 11:13   Dg Chest Port 1 View  Result Date: 05/10/2019 CLINICAL DATA:  68 year old male positive for COVID-19. Fever. EXAM: PORTABLE CHEST 1 VIEW COMPARISON:  Portable chest 05/06/2019 and earlier. FINDINGS: Portable AP semi upright view at 1645 hours. Chronic cardiomegaly. Mildly lower lung volumes compared to October. New bilateral basilar predominant confluent and indistinct pulmonary opacity. No superimposed pneumothorax or pleural effusion. Prior sternotomy. No acute osseous abnormality identified. IMPRESSION: 1. New from 05/06/2019 bilateral basilar predominant bilateral pulmonary opacity compatible with COVID-19 pneumonia. 2. Chronic cardiomegaly. Electronically Signed   By: Genevie Ann M.D.   On: 05/10/2019 17:04   Dg Chest Portable 1 View  Result Date: 05/06/2019 CLINICAL DATA:  Fall. EXAM: PORTABLE CHEST 1 VIEW COMPARISON:  Chest x-ray dated January 13, 2019. FINDINGS: Stable cardiomegaly. Pulmonary vascular congestion. No focal consolidation, pleural effusion, or pneumothorax. No acute osseous abnormality. Old right-sided rib fractures again noted. IMPRESSION: Cardiomegaly with pulmonary vascular congestion. Electronically Signed   By:  Titus Dubin M.D.   On: 05/06/2019 19:11   Vas [place] Lower Extremity Venous (dvt)  Result Date: 05/08/2019  Lower Venous Study Indications: Covid positive, elevated D-Dimer. Has unstable lumbar fracture.  Limitations: Patient in severe pain with compression and body habitus. Comparison Study: No prior study on file for comparison Performing Technologist: Sharion Dove RVS  Examination Guidelines: A complete evaluation includes B-mode imaging, spectral Doppler, color Doppler, and power Doppler as needed of all accessible portions of each vessel. Bilateral testing is considered an integral part of a complete examination. Limited examinations for reoccurring indications may be performed as noted.  +---------+---------------+---------+-----------+----------+--------------+  RIGHT     Compressibility Phasicity Spontaneity Properties Thrombus Aging  +---------+---------------+---------+-----------+----------+--------------+  CFV       Full            Yes       Yes                                    +---------+---------------+---------+-----------+----------+--------------+  SFJ       Full                                                             +---------+---------------+---------+-----------+----------+--------------+  FV Prox   Full                                                             +---------+---------------+---------+-----------+----------+--------------+  FV Mid    Full                                                             +---------+---------------+---------+-----------+----------+--------------+  FV Distal Full                                                             +---------+---------------+---------+-----------+----------+--------------+  PFV       Full                                                             +---------+---------------+---------+-----------+----------+--------------+  POP       Full            Yes       Yes                                     +---------+---------------+---------+-----------+----------+--------------+  PTV       Full                                                             +---------+---------------+---------+-----------+----------+--------------+  PERO      Full                                                             +---------+---------------+---------+-----------+----------+--------------+   +---------+---------------+---------+-----------+----------+--------------+  LEFT      Compressibility Phasicity Spontaneity Properties Thrombus Aging  +---------+---------------+---------+-----------+----------+--------------+  CFV       Full            Yes       Yes                                    +---------+---------------+---------+-----------+----------+--------------+  SFJ       Full                                                             +---------+---------------+---------+-----------+----------+--------------+  FV Prox   Full                                                             +---------+---------------+---------+-----------+----------+--------------+  FV Mid    Full                                                             +---------+---------------+---------+-----------+----------+--------------+  FV Distal Full                                                             +---------+---------------+---------+-----------+----------+--------------+  PFV       Full                                                             +---------+---------------+---------+-----------+----------+--------------+  POP       Full            Yes       Yes                                    +---------+---------------+---------+-----------+----------+--------------+  PTV       Full                                                             +---------+---------------+---------+-----------+----------+--------------+  PERO      Full                                                              +---------+---------------+---------+-----------+----------+--------------+     Summary: Right: There is no evidence of deep vein thrombosis in the lower extremity. Left: There is no evidence of deep vein thrombosis in the lower extremity.  *See table(s) above for measurements and observations. Electronically signed by Deitra Mayo MD on 05/08/2019 at 5:56:44 PM.    Final    Ir Picc Placement Right >5 Yrs Inc Img Guide  Result Date: 05/17/2019 INDICATION: BACTEREMIA, ACCESS FOR LONG-TERM ANTIBIOTICS EXAM: ULTRASOUND AND FLUOROSCOPIC GUIDED PICC LINE INSERTION MEDICATIONS: 1% LIDOCAINE LOCAL CONTRAST:  None FLUOROSCOPY TIME:  120 seconds (12 mGy) COMPLICATIONS: None immediate. TECHNIQUE: The procedure, risks, benefits, and alternatives were explained to the patient's family and informed written consent was obtained. A timeout was performed prior to the  initiation of the procedure. The right upper extremity was prepped with chlorhexidine in a sterile fashion, and a sterile drape was applied covering the operative field. Maximum barrier sterile technique with sterile gowns and gloves were used for the procedure. A timeout was performed prior to the initiation of the procedure. Local anesthesia was provided with 1% lidocaine. Under direct ultrasound guidance, the right brachial vein was accessed with a micropuncture kit after the overlying soft tissues were anesthetized with 1% lidocaine. An ultrasound image was saved for documentation purposes. A guidewire was advanced to the level of the superior caval-atrial junction for measurement purposes and the PICC line was cut to length. A peel-away sheath was placed and a 45 cm, 5 [place], dual lumen was inserted to level of the superior caval-atrial junction. A post procedure spot fluoroscopic was obtained. The catheter easily aspirated and flushed and was sutured in place. A dressing was placed. The patient tolerated the procedure well without immediate post  procedural complication. FINDINGS: After catheter placement, the tip lies within the superior cavoatrial junction. The catheter aspirates and flushes normally and is ready for immediate use. IMPRESSION: Successful ultrasound and fluoroscopic guided placement of a right brachial vein approach, 45 cm, 5 [place], dual lumen PICC with tip at the superior caval-atrial junction. The PICC line is ready for immediate use. Electronically Signed   By: Jerilynn Mages.  Shick M.D.   On: 05/17/2019 16:35   [place] Ekg Site Rite  Result Date: 05/14/2019 If Site Rite image not attached, placement could not be confirmed due to current cardiac rhythm.    Time Spent in minutes  30     Desiree Hane M.D on 05/19/2019 at 6:08 PM  To page go to [email or web address] - password [email or web address]

## 2019-05-19 NOTE — Plan of Care (Signed)
  Problem: Clinical Measurements: Goal: Respiratory complications will improve Outcome: Progressing   Problem: Coping: Goal: Level of anxiety will decrease Outcome: Progressing   Problem: Pain Managment: Goal: General experience of comfort will improve Outcome: Progressing   Problem: Education: Goal: Knowledge of General Education information will improve Description: Including pain rating scale, medication(s)/side effects and non-pharmacologic comfort measures Outcome: Not Progressing   Problem: Health Behavior/Discharge Planning: Goal: Ability to manage health-related needs will improve Outcome: Not Progressing   Problem: Nutrition: Goal: Adequate nutrition will be maintained Outcome: Not Progressing

## 2019-05-19 NOTE — Progress Notes (Addendum)
Received report from [facility], Therapist, sports. Patient resting.Continued complaints re: back pain. Will follow-up with administration of medication

## 2019-05-20 ENCOUNTER — Encounter (HOSPITAL_COMMUNITY)
Admission: EM | Disposition: A | Discharge status: Skilled Nursing Facility | Payer: Self-pay | Source: Home / Self Care | Attending: Internal Medicine

## 2019-05-20 ENCOUNTER — Inpatient Hospital Stay (HOSPITAL_COMMUNITY): Payer: Medicare Other

## 2019-05-20 DIAGNOSIS — R7881 Bacteremia: Secondary | ICD-10-CM

## 2019-05-20 DIAGNOSIS — I482 Chronic atrial fibrillation, unspecified: Secondary | ICD-10-CM | POA: Diagnosis not present

## 2019-05-20 DIAGNOSIS — S32009A Unspecified fracture of unspecified lumbar vertebra, initial encounter for closed fracture: Secondary | ICD-10-CM | POA: Diagnosis not present

## 2019-05-20 DIAGNOSIS — J9611 Chronic respiratory failure with hypoxia: Secondary | ICD-10-CM | POA: Diagnosis not present

## 2019-05-20 HISTORY — PX: TEE WITHOUT CARDIOVERSION: SHX5443

## 2019-05-20 LAB — BASIC METABOLIC PANEL
Anion gap: 7 (ref 5–15)
BUN: 16 mg/dL (ref 8–23)
CO2: 27 mmol/L (ref 22–32)
Calcium: 7.9 mg/dL — ABNORMAL LOW (ref 8.9–10.3)
Chloride: 96 mmol/L — ABNORMAL LOW (ref 98–111)
Creatinine, Ser: 0.79 mg/dL (ref 0.61–1.24)
GFR calc Af Amer: >60 mL/min (ref >60)
GFR calc non Af Amer: >60 mL/min (ref >60)
Glucose, Bld: 152 mg/dL — ABNORMAL HIGH (ref 70–99)
Potassium: 5.4 mmol/L — ABNORMAL HIGH (ref 3.5–5.1)
Sodium: 130 mmol/L — ABNORMAL LOW (ref 135–145)

## 2019-05-20 LAB — CBC
HCT: 29.6 % — ABNORMAL LOW (ref 39.0–52.0)
Hemoglobin: 9.4 g/dL — ABNORMAL LOW (ref 13.0–17.0)
MCH: 27.2 pg (ref 26.0–34.0)
MCHC: 31.8 g/dL (ref 30.0–36.0)
MCV: 85.8 fL (ref 80.0–100.0)
Platelets: 184 10*3/uL (ref 150–400)
RBC: 3.45 MIL/uL — ABNORMAL LOW (ref 4.22–5.81)
RDW: 15.5 % (ref 11.5–15.5)
WBC: 12.4 10*3/uL — ABNORMAL HIGH (ref 4.0–10.5)
nRBC: 0 % (ref 0.0–0.2)

## 2019-05-20 LAB — GLUCOSE, CAPILLARY
Glucose-Capillary: 156 mg/dL — ABNORMAL HIGH (ref 70–99)
Glucose-Capillary: 165 mg/dL — ABNORMAL HIGH (ref 70–99)
Glucose-Capillary: 168 mg/dL — ABNORMAL HIGH (ref 70–99)
Glucose-Capillary: 121 mg/dL — ABNORMAL HIGH (ref 70–99)

## 2019-05-20 LAB — HEPARIN LEVEL (UNFRACTIONATED)
Heparin Unfractionated: 0.24 IU/mL — ABNORMAL LOW (ref 0.30–0.70)
Heparin Unfractionated: 0.15 IU/mL — ABNORMAL LOW (ref 0.30–0.70)
Heparin Unfractionated: 0.36 IU/mL (ref 0.30–0.70)

## 2019-05-20 LAB — LDL CHOLESTEROL, DIRECT: Direct LDL: 41.1 mg/dL (ref 0–99)

## 2019-05-20 SURGERY — ECHOCARDIOGRAM, TRANSESOPHAGEAL
Anesthesia: Moderate Sedation

## 2019-05-20 MED ORDER — MIDAZOLAM HCL (PF) 10 MG/2ML IJ SOLN
INTRAMUSCULAR | Status: DC | PRN
  Administered 2019-05-20: 1 mg via INTRAVENOUS
  Administered 2019-05-20 (×2): 2 mg via INTRAVENOUS

## 2019-05-20 MED ORDER — TORSEMIDE 20 MG PO TABS: 20.0000 mg | ORAL_TABLET | Freq: Every day | ORAL | Status: DC

## 2019-05-20 MED ORDER — MUPIROCIN 2 % EX OINT: 1.0000 "application " | TOPICAL_OINTMENT | Freq: Two times a day (BID) | CUTANEOUS | Status: DC

## 2019-05-20 MED ORDER — MIDAZOLAM HCL (PF) 5 MG/ML IJ SOLN
INTRAMUSCULAR | Status: AC
  Filled 2019-05-20: qty 2

## 2019-05-20 MED ORDER — BUTAMBEN-TETRACAINE-BENZOCAINE 2-2-14 % EX AERO
INHALATION_SPRAY | CUTANEOUS | Status: DC | PRN
  Administered 2019-05-20: 2 via TOPICAL

## 2019-05-20 MED ORDER — SODIUM ZIRCONIUM CYCLOSILICATE 5 G PO PACK
5.0000 g | PACK | Freq: Once | ORAL | Status: DC
  Filled 2019-05-20: qty 1

## 2019-05-20 MED ORDER — FENTANYL CITRATE (PF) 100 MCG/2ML IJ SOLN
INTRAMUSCULAR | Status: DC | PRN
  Administered 2019-05-20 (×2): 25 ug via INTRAVENOUS

## 2019-05-20 MED ORDER — FENTANYL CITRATE (PF) 100 MCG/2ML IJ SOLN
INTRAMUSCULAR | Status: AC
  Filled 2019-05-20: qty 2

## 2019-05-20 NOTE — Progress Notes (Signed)
[facility]  PROGRESS NOTE  Cody Hale ENI:[id number] DOB: 1950-10-26 DOA: 05/06/2019 PCP: [name], MD Admit date - 05/06/2019   Admitting Physician Alma Friendly, MD  Outpatient Primary MD for the patient is [name], MD  LOS - 62 Brief Narrative   Cody Hale is a 68 y.o. 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  Hospital course complicated by IRWER-15 infection positive with no symptoms though he did complete 5 days of remdesivir and full course of heparin drip GBS bacteremia IV penicillin, currently awaiting TEE results  Subjective  [name] today has recently returned from TEE and is bit sleepy but appropriate on exam. Has a wet sounding cough. No fevers or chills. No nausea. Minimal appetite. Persistent back pain  A & P   1. Unstable L1-L2 fracture. 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  Neurosurgery plans for operative stabilization pending resolution of infectious process addressed in #2, continue conservative management with TLSO bracing and bed rest  2. GBS bacteremia with history of mitral prosthetic valve, stable. On penicillin and remains afebrile, white count trending down and no recurrent bacteremia. Pending TEE on 05/20/19 given high concern for endocarditis with history of prosthetic valve-if positive will need to add gentamicin and consult CT surgery  3. COVID 19 pneumonia. Repeat CXR shows resolution of consolidation/opacities. Ferritin, CRP are increasing Currently on 3 L with normal oxygen saturation.  Exam continue vitamin C  and zinc, continue to monitor inflammatory markers.  4. Hyponatremia with normal serum osm ( 281), improving.  Sodium currently 130 after r ( nadir of 127 ), previously 129-132 in days prior.  Serum osm within normal limits.  Glucose within normal limits, no hyperproteinemia, check LDL, would expect hypotonic hyponatremia in the setting of diuresis.  Continue to monitor BMP.  5. Hyperkalemia, intermittent. Previously responded to [place], will repeat and monitor.    Not on any potassium supplementation, monitor BMP.  6. Concern for swallowing difficulty.  Patient denies however noticed grimacing in difficulty with swallowing pills.  Speech eval ordered will follow up on 11/13.  7. CHF with reduced EF, mild exacerbation, appears compensated.  1.4 L out in last 24 hours, net -500 L.  No overt peripheral edema, stable O2, difficult to assess for crackles but recent chest x-ray shows no lung involvement.  It is possible hyponatremia related to some mild hypervolemia but now improving. Will resume home torsemide regimen and closely monitor,  continue fluid restriction, daily weights, monitoring BMP continue Coreg  8. Chronic atrial fibrillation, rate controlled.  Holding home Xarelto, continue heparin drip given will likely need neurosurgical intervention once infection status stable.  9. Type 2 diabetes, continue Lantus 45 units CBGs at goal (reduced from home regimen of Tresiba 50 units daily), NovoLog scheduled 8 units 3 times daily, sliding scale as needed.  Holding home [place]  10. Thrombocytopenia, resolved.   no active signs of bleeding, monitor CBC.  11. GERD, stable.  Continue ProtonixDiminished appetite.  Continue Ensure twice daily  12. COPD, stable.  Continue Incruse.     Family Communication  : Spoke with son Cody Hale at [phone] on  05/19/19 ° °Code Status :  FULL ° °Disposition Plan  : Continue inpatient stay, requires TEE to out infective endocarditis, monitor  electrolytes ° °Consults  : ID, neurosurgery, cardiology °Procedures  : °10/31 venous duplex °TEE, 05/20/19 pending results ° °DVT Prophylaxis  : IV Heparin ° °Lab Results  °Component Value Date  ° PLT 184 05/20/2019  ° ° °Diet :  °Diet Order   ° None  °  °  ° °Inpatient Medications °Scheduled Meds: °• carvedilol  3.125 mg Oral BID  °• Chlorhexidine Gluconate Cloth  6 each Topical Daily  °• docusate sodium  100 mg Oral Daily  °• fluticasone  2 spray Each Nare Daily  °• fluticasone furoate-vilanterol  1 puff Inhalation Daily  °• furosemide  40 mg Intravenous Daily  °• insulin aspart  0-5 Units Subcutaneous QHS  °• insulin aspart  0-9 Units Subcutaneous TID WC  °• insulin aspart  8 Units Subcutaneous TID WC  °• insulin glargine  45 Units Subcutaneous Daily  °• loratadine  10 mg Oral QPM  °• multivitamin with minerals  1 tablet Oral Daily  °• pantoprazole  40 mg Oral Daily  °• polyethylene glycol  17 g Oral Daily  °• Ensure Max Protein  11 oz Oral BID  °• senna-docusate  2 tablet Oral BID  °• umeclidinium bromide  1 puff Inhalation Daily  °• vitamin C  500 mg Oral Daily  °• zinc sulfate  220 mg Oral Daily  ° °Continuous Infusions: °• heparin 2,150 Units/hr (05/20/19 0548)  °• penicillin g continuous IV infusion 12 Million Units (05/20/19 0350)  ° °PRN Meds:.acetaminophen **OR** acetaminophen, chlorpheniramine-HYDROcodone, guaiFENesin-dextromethorphan, hydrALAZINE, lidocaine (PF), metoprolol tartrate, morphine injection, nitroGLYCERIN, ondansetron **OR** ondansetron (ZOFRAN) IV, oxyCODONE ° °Antibiotics  :   °Anti-infectives (From admission, onward)  ° Start     Dose/Rate Route Frequency Ordered Stop  ° 05/15/19 1430  penicillin G potassium 12 Million Units in dextrose 5 % 500 mL continuous infusion    ° 12 Million Units °41.7 mL/hr over 12 Hours Intravenous Every 12 hours 05/15/19 1343    ° 05/13/19 1000  penicillin G potassium 12 Million Units in dextrose 5 % 500 mL continuous infusion  Status:  Discontinued    ° 12  Million Units °41.7 mL/hr over 12 Hours Intravenous Every 12 hours 05/12/19 1159 05/15/19 1343  ° 05/11/19 0730  cefTRIAXone (ROCEPHIN) 2 g in sodium chloride 0.9 % 100 mL IVPB  Status:  Discontinued    ° 2 g °200 mL/hr over 30 Minutes Intravenous Every 24 hours 05/11/19 0721 05/12/19 1159  ° 05/08/19 0200  remdesivir 100 mg in sodium chloride 0.9 % 250 mL IVPB    ° 100 mg °500 mL/hr over 30 Minutes Intravenous Every 24 hours 05/07/19 0059 05/11/19 0513  ° 05/07/19 0100  remdesivir 200 mg in sodium chloride 0.9 % 250 mL IVPB    ° 200 mg °500 mL/hr over 30 Minutes Intravenous Once 05/07/19 0057 05/07/19 0512  °  ° ° ° °Objective  ° °Vitals:  ° 05/20/19 1305 05/20/19 1310 05/20/19 1315 05/20/19 1320  °BP: (!) 132/45 (!) 139/48 (!) 122/40 (!) 105/52  °Pulse: (!) 50 93 63 (!) 55  °Resp: (!) 29 (!) 24 (!) 25 (!) 22  °Temp:  (!) 97 °F (36.1 °C)    °TempSrc:  Temporal    °SpO2: 97% 98% 97% 98%  °Weight:      °Height:      ° ° °SpO2: 98 % °O2 Flow Rate (  L/min): 3 L/min °FiO2 (%): 32 % ° °Wt Readings from Last 3 Encounters:  °05/06/19 121.1 kg  °03/24/19 120.7 kg  °02/16/19 117.9 kg  ° ° ° °Intake/Output Summary (Last 24 hours) at 05/20/2019 1438 °Last data filed at 05/20/2019 1405 °Gross per 24 hour  °Intake 879.84 ml  °Output 1025 ml  °Net -145.16 ml  ° ° °Physical Exam: ° °Oriented X 3, sleepy but arousable °No new F.N deficits,  °[place].AT, °No JVD appreciated °Symmetrical Chest wall movement, Good air movement bilaterally, no respiratory distress on 3 L °RRR,No Gallops,Rubs or new Murmurs,  °+ve B.Sounds, Abd Soft, No tenderness, No rebound, guarding or rigidity. °No Cyanosis, Clubbing or edema,  °No new Rash or bruise   ° ° °I have personally reviewed the following:  ° °Data Reviewed: ° °CBC °Recent Labs  °Lab 05/16/19 °0354 05/17/19 °0521 05/18/19 °0500 05/19/19 °0942 05/20/19 °0406  °WBC 12.8* 13.5* 20.8* 13.5* 12.4*  °HGB 13.6 12.4* 11.3* 10.5* 9.4*  °HCT 41.3 38.4* 35.4* 32.0* 29.6*  °PLT 99* 114* 123* 159 184  °MCV  84.8 84.6 86.8 86.0 85.8  °MCH 27.9 27.3 27.7 28.2 27.2  °MCHC 32.9 32.3 31.9 32.8 31.8  °RDW 15.3 15.1 15.3 15.4 15.5  ° ° °Chemistries  °Recent Labs  °Lab 05/15/19 °0334 05/17/19 °0521 05/19/19 °0942 05/19/19 °1516 05/20/19 °0406  °NA 132* 129* 127* 128* 130*  °K 5.3* 4.6 5.3* 4.9 5.4*  °CL 99 96* 93* 94* 96*  °CO2 25 25 27 26 27  °GLUCOSE 283* 194* 207* 182* 152*  °BUN 21 14 20 19 16  °CREATININE 0.80 0.79 0.91 0.88 0.79  °CALCIUM 8.3* 8.1* 7.9* 7.7* 7.9*  °AST 15 20 41  --   --   °ALT 13 16 19  --   --   °ALKPHOS 64 62 65  --   --   °BILITOT 1.6* 1.1 1.2  --   --   ° °------------------------------------------------------------------------------------------------------------------ °Recent Labs  °  05/20/19 °0406  °LDLDIRECT 41.1  ° ° °Lab Results  °Component Value Date  ° HGBA1C 7.4 (H) 03/24/2019  ° °------------------------------------------------------------------------------------------------------------------ °No results for input(s): TSH, T4TOTAL, T3FREE, THYROIDAB in the last 72 hours. ° °Invalid input(s): FREET3 °------------------------------------------------------------------------------------------------------------------ °Recent Labs  °  05/18/19 °0500 05/19/19 °0942  °FERRITIN 269 287  ° ° °Coagulation profile °No results for input(s): INR, PROTIME in the last 168 hours. ° °No results for input(s): DDIMER in the last 72 hours. ° °Cardiac Enzymes °No results for input(s): CKMB, TROPONINI, MYOGLOBIN in the last 168 hours. ° °Invalid input(s): CK °------------------------------------------------------------------------------------------------------------------ °   °Component Value Date/Time  ° BNP 326.0 (H) 05/06/2019 1552  ° ° °Micro Results °Recent Results (from the past 240 hour(s))  °Culture, blood (routine x 2)     Status: Abnormal  ° Collection Time: 05/10/19  4:26 PM  ° Specimen: BLOOD LEFT HAND  °Result Value Ref Range Status  ° Specimen Description BLOOD LEFT HAND  Final  ° Special Requests    Final  °  BOTTLES DRAWN AEROBIC ONLY Blood Culture adequate volume  ° Culture  Setup Time   Final  °  GRAM POSITIVE COCCI °AEROBIC BOTTLE ONLY °CRITICAL VALUE NOTED.  VALUE IS CONSISTENT WITH PREVIOUSLY REPORTED AND CALLED VALUE. °Performed at [facility], [address] [place], [place] [address] °  ° Culture STREPTOCOCCUS GROUP G (A)  Final  ° Report Status 05/13/2019 FINAL  Final  ° Organism ID, Bacteria STREPTOCOCCUS GROUP G  Final  °      Susceptibility  ° Streptococcus group g - MIC*  °  CLINDAMYCIN <=0.25 SENSITIVE Sensitive   °  AMPICILLIN <=0.25 SENSITIVE Sensitive   °  ERYTHROMYCIN <=0.12 SENSITIVE Sensitive   °  VANCOMYCIN 0.25 SENSITIVE Sensitive   °  CEFTRIAXONE <=0.12 SENSITIVE Sensitive   °  LEVOFLOXACIN 0.5 SENSITIVE Sensitive   °  PENICILLIN Value in next row Sensitive   °   SENSITIVE<=0.06  °  * STREPTOCOCCUS GROUP G  °Culture, blood (routine x 2)     Status: Abnormal  ° Collection Time: 05/10/19  4:26 PM  ° Specimen: BLOOD LEFT HAND  °Result Value Ref Range Status  ° Specimen Description BLOOD LEFT HAND  Final  ° Special Requests   Final  °  BOTTLES DRAWN AEROBIC ONLY Blood Culture adequate volume  ° Culture  Setup Time   Final  °  GRAM POSITIVE COCCI °AEROBIC BOTTLE ONLY °CRITICAL RESULT CALLED TO, READ BACK BY AND VERIFIED WITH: J. Frens PharmD 7:15 05/11/19 ([email or web address]) °  ° Culture (A)  Final  °  STREPTOCOCCUS GROUP G °SUSCEPTIBILITIES PERFORMED ON PREVIOUS CULTURE WITHIN THE LAST 5 DAYS. °Performed at [facility], [address] [place], [place] [address] °  ° Report Status 05/13/2019 FINAL  Final  °Blood Culture ID Panel (Reflexed)     Status: Abnormal  ° Collection Time: 05/10/19  4:26 PM  °Result Value Ref Range Status  ° Enterococcus species NOT DETECTED NOT DETECTED Final  ° Listeria monocytogenes NOT DETECTED NOT DETECTED Final  ° Staphylococcus species NOT DETECTED NOT DETECTED Final  ° Staphylococcus aureus (BCID) NOT DETECTED NOT DETECTED Final  ° Streptococcus species  DETECTED (A) NOT DETECTED Final  °  Comment: Not Enterococcus species, Streptococcus agalactiae, Streptococcus pyogenes, or Streptococcus pneumoniae. °CRITICAL RESULT CALLED TO, READ BACK BY AND VERIFIED WITH: °J. Frens PharmD 7:15 05/11/19 ([email or web address]) °  ° Streptococcus agalactiae NOT DETECTED NOT DETECTED Final  ° Streptococcus pneumoniae NOT DETECTED NOT DETECTED Final  ° Streptococcus pyogenes NOT DETECTED NOT DETECTED Final  ° Acinetobacter baumannii NOT DETECTED NOT DETECTED Final  ° Enterobacteriaceae species NOT DETECTED NOT DETECTED Final  ° Enterobacter cloacae complex NOT DETECTED NOT DETECTED Final  ° Escherichia coli NOT DETECTED NOT DETECTED Final  ° Klebsiella oxytoca NOT DETECTED NOT DETECTED Final  ° Klebsiella pneumoniae NOT DETECTED NOT DETECTED Final  ° Proteus species NOT DETECTED NOT DETECTED Final  ° Serratia marcescens NOT DETECTED NOT DETECTED Final  ° Haemophilus influenzae NOT DETECTED NOT DETECTED Final  ° Neisseria meningitidis NOT DETECTED NOT DETECTED Final  ° Pseudomonas aeruginosa NOT DETECTED NOT DETECTED Final  ° Candida albicans NOT DETECTED NOT DETECTED Final  ° Candida glabrata NOT DETECTED NOT DETECTED Final  ° Candida krusei NOT DETECTED NOT DETECTED Final  ° Candida parapsilosis NOT DETECTED NOT DETECTED Final  ° Candida tropicalis NOT DETECTED NOT DETECTED Final  °  Comment: Performed at [facility], [address] [place], [place] [address]  °Culture, Urine     Status: None  ° Collection Time: 05/10/19  7:13 PM  ° Specimen: Urine, Catheterized  °Result Value Ref Range Status  ° Specimen Description URINE, CATHETERIZED  Final  ° Special Requests NONE  Final  ° Culture   Final  °  NO GROWTH °Performed at [facility], [address] [place], [place] [address] °  ° Report Status 05/11/2019 FINAL  Final  °Culture, blood (routine x 2)     Status: Abnormal  ° Collection   Time: 05/11/19 12:35 PM   Specimen: BLOOD  Result Value Ref Range Status   Specimen Description  BLOOD RIGHT ANTECUBITAL  Final   Special Requests   Final    BOTTLES DRAWN AEROBIC ONLY Blood Culture results may not be optimal due to an inadequate volume of blood received in culture bottles   Culture  Setup Time   Final    GRAM POSITIVE COCCI AEROBIC BOTTLE ONLY CRITICAL RESULT CALLED TO, READ BACK BY AND VERIFIED WITH: C. WALSTON PHARMD, AT [id number] 05/12/19 BY D. VANHOOK    Culture (A)  Final    STAPHYLOCOCCUS SPECIES (COAGULASE NEGATIVE) THE SIGNIFICANCE OF ISOLATING THIS ORGANISM FROM A SINGLE SET OF BLOOD CULTURES WHEN MULTIPLE SETS ARE DRAWN IS UNCERTAIN. PLEASE NOTIFY THE MICROBIOLOGY DEPARTMENT WITHIN ONE WEEK IF SPECIATION AND SENSITIVITIES ARE REQUIRED. Performed at [facility], [place] [address], [place], [place] [address]    Report Status 05/13/2019 FINAL  Final  Culture, blood (routine x 2)     Status: None   Collection Time: 05/11/19 12:45 PM   Specimen: BLOOD RIGHT HAND  Result Value Ref Range Status   Specimen Description BLOOD RIGHT HAND  Final   Special Requests   Final    BOTTLES DRAWN AEROBIC ONLY Blood Culture results may not be optimal due to an inadequate volume of blood received in culture bottles   Culture   Final    NO GROWTH 5 DAYS Performed at [facility], [place] [address], [place], [place] [address]    Report Status 05/16/2019 FINAL  Final    Radiology Reports Dg Pelvis 1-2 Views  Result Date: 05/06/2019 CLINICAL DATA:  Low back pain after fall. EXAM: PELVIS - 1-2 VIEW COMPARISON:  Pelvic and right hip x-rays dated September 08, 2018. FINDINGS: There is no evidence of pelvic fracture or diastasis. Prior right total hip arthroplasty. Unchanged moderate left hip osteoarthritis. IMPRESSION: No acute osseous abnormality. Electronically Signed   By: Titus Dubin M.D.   On: 05/06/2019 19:24   Dg Forearm Right  Result Date: 05/06/2019 CLINICAL DATA:  Right forearm pain after fall. EXAM: RIGHT FOREARM - 2 VIEW COMPARISON:  Right wrist and elbow x-rays  dated May 16, 2018. FINDINGS: There is no evidence of fracture or other focal bone lesions. Soft tissues are unremarkable. IMPRESSION: Negative. Electronically Signed   By: Titus Dubin M.D.   On: 05/06/2019 19:13   Dg Abd 1 View  Result Date: 05/15/2019 CLINICAL DATA:  Evaluate for constipation. EXAM: ABDOMEN - 1 VIEW COMPARISON:  CT abdomen pelvis 04/11/2018. FINDINGS: Single AP supine radiograph of the abdomen through the pubic symphysis. The lung bases are excluded from field of view. Gastric distention. There are no dilated loops of bowel to suggest obstruction. Mild stool burden. No supine evidence for free air. Status post right hip arthroplasty, incompletely visualized. IMPRESSION: Gastric distention. Nonobstructive bowel gas pattern. Mild stool burden. Electronically Signed   By: Audie Pinto M.D.   On: 05/15/2019 18:25   Ct Lumbar Spine Wo Contrast  Result Date: 05/11/2019 CLINICAL DATA:  Lumbar fracture.  Surgical planning. EXAM: CT LUMBAR SPINE WITHOUT CONTRAST TECHNIQUE: Multidetector CT imaging of the lumbar spine was performed without intravenous contrast administration. Multiplanar CT image reconstructions were also generated. COMPARISON:  05/06/2019 FINDINGS: The study is mildly motion degraded despite repeat imaging. Segmentation: 5 lumbar type vertebrae. Alignment: Trace retrolisthesis of L5 on S1. Vertebrae: Diffuse ankylosis is again noted throughout the lumbar and included lower thoracic spine as well  as across both SI joints. A transverse fracture is again seen through the L1-2 disc space including across the bridging osteophytes/syndesmophytes. Posterior element extension was better demonstrated on the prior study due to motion and image noise on today's examination. Anterior widening of the L1-2 disc space is unchanged. No new fracture is identified. Paraspinal and other soft tissues: Small pleural effusions and dependent atelectasis in the lung bases. Aortic  atherosclerosis. Partially visualized right hip arthroplasty. Disc levels: Disc space narrowing greatest at L5-S1 where endplate and facet spurring result in severe bilateral neural foraminal stenosis. IMPRESSION: 1. Lumbar spine CT for surgical planning. 2. Findings suggestive of ankylosing spondylitis with fracture through the L1-2 disc space as previously described. Unchanged anterior disc space widening. 3.  Aortic Atherosclerosis (ICD10-I70.0). Electronically Signed   By: Allen  Grady M.D.   On: 05/11/2019 14:04  ° °Ct Lumbar Spine Wo Contrast ° °Result Date: 05/06/2019 °CLINICAL DATA:  Tender to palpation over the lower lumbar spine, fall EXAM: CT LUMBAR SPINE WITHOUT CONTRAST TECHNIQUE: Multidetector CT imaging of the lumbar spine was performed without intravenous contrast administration. Multiplanar CT image reconstructions were also generated. COMPARISON:  CT April 11, 2018 FINDINGS: Segmentation: There are 5 non-rib bearing lumbar type vertebral bodies with the last intervertebral disc space labeled as L5-S1. Alignment: Normal Vertebrae: There is a fracture seen through the anterior bridging osteophyte of L1 with widening of the anterior inter disc space which measures 1.6 cm. The fracture extends through the right posterior bridging osteophyte and pedicle as well as through the left-sided pedicle and superior articulating facet. There is also a minimally displaced fracture seen at the right-sided inferior corner of the L1 vertebral body. The fracture also extends through the L1 spinous process. There is minimal right-sided facet joint widening seen at L1-L2. No jumped facets however are noted. A small amount of air seen within the inter disc space at L1-L2. No other definite fracture is identified. There are bridging anterior osteophytes seen through the remainder of the lumbar spine. There inter disc calcifications. There is findings of of diffuse enthesopathy seen between the spinous processes in the  lumbar spine. Paraspinal and other soft tissues: Mild prevertebral soft tissue swelling is seen at L1-L2. No large hematoma however is noted. The sacroiliac joints are intact. There is diffuse ankylosis seen around the bilateral sacroiliac joints. Scattered aortic atherosclerosis is noted. Disc levels: Degenerative changes are seen throughout the lumbar spine with disc height loss and facet arthropathy which is most notable at L5-S1 with severe bilateral neural foraminal narrowing. IMPRESSION: 1. Fracture through the anterior bridging osteophyte of L1-2 with widening of the inter disc space . The fracture extends through the posterior columns at L1, as described above through the pedicles, facet, and spinous process. 2. Mild prevertebral soft tissue swelling is seen at this level. 3. Findings suggestive of ankylosing spondylitis. 4.  Aortic Atherosclerosis (ICD10-I70.0). These results were called by telephone at the time of interpretation on 05/06/2019 at 4:56 pm to provider RICHARD DYKSTRA , who verbally acknowledged these results. Electronically Signed   By: Bindu  Avutu M.D.   On: 05/06/2019 17:02  ° °Dg Chest Port 1 View ° °Result Date: 05/18/2019 °CLINICAL DATA:  Pneumonia. EXAM: PORTABLE CHEST 1 VIEW COMPARISON:  May 15, 2019. FINDINGS: Stable cardiomegaly. No pneumothorax or pleural effusion is noted. Right-sided PICC line is noted with distal tip in expected position of cavoatrial junction. No acute pulmonary disease is noted. Bony thorax is unremarkable. IMPRESSION: Right-sided PICC line is been repositioned   with tip in expected position of cavoatrial junction. No acute cardiopulmonary abnormality seen. Electronically Signed   By: James  Green Jr M.D.   On: 05/18/2019 10:52  ° °Dg Chest Port 1 View ° °Result Date: 05/15/2019 °CLINICAL DATA:  68-year-old male with a history of PICC placement EXAM: PORTABLE CHEST 1 VIEW COMPARISON:  None. FINDINGS: Cardiomediastinal silhouette unchanged in size and contour  with surgical changes of median sternotomy and cardiomegaly. No pneumothorax. No pleural effusion. Coarsened interstitial markings throughout. Right upper extremity PICC is redundant, without visualization of the point of termination of the tip. Redemonstration of partially healed right-sided rib fractures IMPRESSION: Right upper extremity PICC is redundant within the veins in the tip is not visualized. Repositioning is indicated. Similar appearance of the lungs with diffuse reticulonodular opacities. Similar appearance of surgical changes of median sternotomy and cardiomegaly. Electronically Signed   By: Jaime  Wagner D.O.   On: 05/15/2019 11:13  ° °Dg Chest Port 1 View ° °Result Date: 05/10/2019 °CLINICAL DATA:  68-year-old male positive for COVID-19. Fever. EXAM: PORTABLE CHEST 1 VIEW COMPARISON:  Portable chest 05/06/2019 and earlier. FINDINGS: Portable AP semi upright view at 1645 hours. Chronic cardiomegaly. Mildly lower lung volumes compared to October. New bilateral basilar predominant confluent and indistinct pulmonary opacity. No superimposed pneumothorax or pleural effusion. Prior sternotomy. No acute osseous abnormality identified. IMPRESSION: 1. New from 05/06/2019 bilateral basilar predominant bilateral pulmonary opacity compatible with COVID-19 pneumonia. 2. Chronic cardiomegaly. Electronically Signed   By: H  Hall M.D.   On: 05/10/2019 17:04  ° °Dg Chest Portable 1 View ° °Result Date: 05/06/2019 °CLINICAL DATA:  Fall. EXAM: PORTABLE CHEST 1 VIEW COMPARISON:  Chest x-ray dated January 13, 2019. FINDINGS: Stable cardiomegaly. Pulmonary vascular congestion. No focal consolidation, pleural effusion, or pneumothorax. No acute osseous abnormality. Old right-sided rib fractures again noted. IMPRESSION: Cardiomegaly with pulmonary vascular congestion. Electronically Signed   By: William T Derry M.D.   On: 05/06/2019 19:11  ° °Vas Us Lower Extremity Venous (dvt) ° °Result Date: 05/08/2019 ° Lower Venous Study  Indications: Covid positive, elevated D-Dimer. Has unstable lumbar fracture.  Limitations: Patient in severe pain with compression and body habitus. Comparison Study: No prior study on file for comparison Performing Technologist: Candace Kanady RVS  Examination Guidelines: A complete evaluation includes B-mode imaging, spectral Doppler, color Doppler, and power Doppler as needed of all accessible portions of each vessel. Bilateral testing is considered an integral part of a complete examination. Limited examinations for reoccurring indications may be performed as noted.  +---------+---------------+---------+-----------+----------+--------------+  RIGHT     Compressibility Phasicity Spontaneity Properties Thrombus Aging  +---------+---------------+---------+-----------+----------+--------------+  CFV       Full            Yes       Yes                                    +---------+---------------+---------+-----------+----------+--------------+  SFJ       Full                                                             +---------+---------------+---------+-----------+----------+--------------+  FV Prox   Full                                                             +---------+---------------+---------+-----------+----------+--------------+    FV Mid    Full                                                             +---------+---------------+---------+-----------+----------+--------------+  FV Distal Full                                                             +---------+---------------+---------+-----------+----------+--------------+  PFV       Full                                                             +---------+---------------+---------+-----------+----------+--------------+  POP       Full            Yes       Yes                                    +---------+---------------+---------+-----------+----------+--------------+  PTV       Full                                                              +---------+---------------+---------+-----------+----------+--------------+  PERO      Full                                                             +---------+---------------+---------+-----------+----------+--------------+   +---------+---------------+---------+-----------+----------+--------------+  LEFT      Compressibility Phasicity Spontaneity Properties Thrombus Aging  +---------+---------------+---------+-----------+----------+--------------+  CFV       Full            Yes       Yes                                    +---------+---------------+---------+-----------+----------+--------------+  SFJ       Full                                                             +---------+---------------+---------+-----------+----------+--------------+  FV Prox   Full                                                             +---------+---------------+---------+-----------+----------+--------------+    FV Mid    Full                                                             +---------+---------------+---------+-----------+----------+--------------+  FV Distal Full                                                             +---------+---------------+---------+-----------+----------+--------------+  PFV       Full                                                             +---------+---------------+---------+-----------+----------+--------------+  POP       Full            Yes       Yes                                    +---------+---------------+---------+-----------+----------+--------------+  PTV       Full                                                             +---------+---------------+---------+-----------+----------+--------------+  PERO      Full                                                             +---------+---------------+---------+-----------+----------+--------------+     Summary: Right: There is no evidence of deep vein thrombosis in the lower extremity. Left: There is no evidence of deep  vein thrombosis in the lower extremity.  *See table(s) above for measurements and observations. Electronically signed by Christopher Dickson MD on 05/08/2019 at 5:56:44 PM.    Final   ° °Ir Picc Placement Right >5 Yrs Inc Img Guide ° °Result Date: 05/17/2019 °INDICATION: BACTEREMIA, ACCESS FOR LONG-TERM ANTIBIOTICS EXAM: ULTRASOUND AND FLUOROSCOPIC GUIDED PICC LINE INSERTION MEDICATIONS: 1% LIDOCAINE LOCAL CONTRAST:  None FLUOROSCOPY TIME:  120 seconds (12 mGy) COMPLICATIONS: None immediate. TECHNIQUE: The procedure, risks, benefits, and alternatives were explained to the patient's family and informed written consent was obtained. A timeout was performed prior to the initiation of the procedure. The right upper extremity was prepped with chlorhexidine in a sterile fashion, and a sterile drape was applied covering the operative field. Maximum barrier sterile technique with sterile gowns and gloves were used for the procedure. A timeout was performed prior to the initiation of the procedure. Local anesthesia was provided with 1% lidocaine. Under direct ultrasound guidance, the right brachial vein was   accessed with a micropuncture kit after the overlying soft tissues were anesthetized with 1% lidocaine. An ultrasound image was saved for documentation purposes. A guidewire was advanced to the level of the superior caval-atrial junction for measurement purposes and the PICC line was cut to length. A peel-away sheath was placed and a 45 cm, 5 [place], dual lumen was inserted to level of the superior caval-atrial junction. A post procedure spot fluoroscopic was obtained. The catheter easily aspirated and flushed and was sutured in place. A dressing was placed. The patient tolerated the procedure well without immediate post procedural complication. FINDINGS: After catheter placement, the tip lies within the superior cavoatrial junction. The catheter aspirates and flushes normally and is ready for immediate use. IMPRESSION:  Successful ultrasound and fluoroscopic guided placement of a right brachial vein approach, 45 cm, 5 French, dual lumen PICC with tip at the superior caval-atrial junction. The PICC line is ready for immediate use. Electronically Signed   By: Jerilynn Mages.  Shick M.D.   On: 05/17/2019 16:35   [place] Ekg Site Rite  Result Date: 05/14/2019 If Site Rite image not attached, placement could not be confirmed due to current cardiac rhythm.    Time Spent in minutes  30     Desiree Hane M.D on 05/20/2019 at 2:38 PM  To page go to [email or web address] - password [email or web address]

## 2019-05-20 NOTE — Progress Notes (Signed)
ANTICOAGULATION CONSULT NOTE  Pharmacy Consult for Heparin Indication: atrial fibrillation   Patient Measurements: Height: 5\' 9"  (175.3 cm) Weight: 267 lb (121.1 kg) IBW/kg (Calculated) : 70.7 Heparin Dosing Weight: 98 kg  Vital Signs: Temp: 99.1 F (37.3 C) (11/12 0756) Temp Source: Oral (11/12 0756) BP: 116/46 (11/12 0756) Pulse Rate: 71 (11/12 0756)  Labs: Recent Labs    05/18/19 0500 05/19/19 0942 05/19/19 1516 05/20/19 0406 05/20/19 1107  HGB 11.3* 10.5*  --  9.4*  --   HCT 35.4* 32.0*  --  29.6*  --   PLT 123* 159  --  184  --   HEPARINUNFRC 0.61 0.36  --  0.24* 0.15*  CREATININE  --  0.91 0.88 0.79  --      Assessment: 68 year old male on Xarelto prior to admission for atrial fibrillation who presented post fall and was transitioned to Heparin IV due to need for possible lumbar surgery.   Heparin level is subtherapeutic on current rate of 2150 units/hr.   Cbc stable  Goal of Therapy:  Heparin level 0.3-0.7 units/ml Monitor platelets by anticoagulation protocol: Yes    Plan:  - Increase Heparin to 2300 units/hr - Next lvl 2000 - daily hep lvl cbc  Barth Kirks, PharmD, BCPS, BCCCP Clinical Pharmacist  Please check AMION for all [place] numbers  05/20/2019 11:35 AM

## 2019-05-20 NOTE — Progress Notes (Signed)
SLP Cancellation Note  Patient Details Name: Cody Hale MRN: [id number] DOB: 1951/05/29   Cancelled treatment:       Reason Eval/Treat Not Completed: Other (comment)(Patient NPO for TEE planned for this afternoon. ) Planning to f/u 11/13 am.   Gabriel Rainwater MA, CCC-SLP     [name] 05/20/2019, 11:09 AM

## 2019-05-20 NOTE — Progress Notes (Signed)
  Echocardiogram 2D Echocardiogram has been performed.  Jannett Celestine 05/20/2019, 1:29 PM

## 2019-05-20 NOTE — Interval H&P Note (Signed)
History and Physical Interval Note:  05/20/2019 12:33 PM  Cody Hale  has presented today for surgery, with the diagnosis of BACTEREMIA.  The various methods of treatment have been discussed with the patient and family. After consideration of risks, benefits and other options for treatment, the patient has consented to  Procedure(s): TRANSESOPHAGEAL ECHOCARDIOGRAM (TEE) (N/A) as a surgical intervention.  The patient's history has been reviewed, patient examined, no change in status, stable for surgery.  I have reviewed the patient's chart and labs.  Questions were answered to the patient's satisfaction.     Donato Heinz

## 2019-05-20 NOTE — CV Procedure (Signed)
    TRANSESOPHAGEAL ECHOCARDIOGRAM   NAME:  Cody Hale   MRN: [id number] DOB:  05/19/1951   ADMIT DATE: 05/06/2019  INDICATIONS: Bacteremia  PROCEDURE:   Informed consent was obtained prior to the procedure. The risks, benefits and alternatives for the procedure were discussed and the patient comprehended these risks.  Risks include, but are not limited to, cough, sore throat, vomiting, nausea, somnolence, esophageal and stomach trauma or perforation, bleeding, low blood pressure, aspiration, pneumonia, infection, trauma to the teeth and death.    Procedural time out performed. The oropharynx was anesthetized with topical 1% benzocaine.    During this procedure the patient is administered a total of Versed 5 mg and Fentanyl 50 mcg to achieve and maintain moderate conscious sedation.  The patient's heart rate, blood pressure, and oxygen saturation are monitored continuously during the procedure. The period of conscious sedation is 32 minutes, of which I was present face-to-face 100% of this time.   The transesophageal probe was inserted in the esophagus and stomach without difficulty and multiple views were obtained.    COMPLICATIONS:    There were no immediate complications.  FINDINGS:  LEFT VENTRICLE: EF = 40-45%.  Septal dyskinesis.  RIGHT VENTRICLE: Mild enlargement and mild systolic dysfunction  LEFT ATRIUM: No thrombus/mass.  LEFT ATRIAL APPENDAGE: No thrombus/mass.   RIGHT ATRIUM: No thrombus/mass.  Catheter in right atrium  AORTIC VALVE:  Trileaflet. Trivial regurgitation. No vegetation.  MITRAL VALVE:    S/p bioprosthetic MVR. Trivial regurgitation. No vegetation.  TRICUSPID VALVE: Normal structure. Trivial regurgitation. No vegetation.  PULMONIC VALVE: Grossly normal structure. Trivial regurgitation. No apparent vegetation.  INTERATRIAL SEPTUM: No PFO or ASD seen by color Doppler.  PERICARDIUM: No effusion noted.  DESCENDING AORTA: Moderate diffuse plaque  seen   CONCLUSION: No vegetation seen.  Oswaldo Milian MD [place]  [address], [place],  [phone]   3:34 PM

## 2019-05-20 NOTE — Progress Notes (Signed)
ANTICOAGULATION CONSULT NOTE  Pharmacy Consult for Heparin Indication: atrial fibrillation   Patient Measurements: Height: 5\' 9"  (175.3 cm) Weight: 267 lb (121.1 kg) IBW/kg (Calculated) : 70.7 Heparin Dosing Weight: 98 kg  Vital Signs: Temp: 98.9 F (37.2 C) (11/12 1640) Temp Source: Oral (11/12 1640) BP: 124/52 (11/12 1640) Pulse Rate: 60 (11/12 1640)  Labs: Recent Labs    05/18/19 0500 05/19/19 0942 05/19/19 1516 05/20/19 0406 05/20/19 1107 05/20/19 1947  HGB 11.3* 10.5*  --  9.4*  --   --   HCT 35.4* 32.0*  --  29.6*  --   --   PLT 123* 159  --  184  --   --   HEPARINUNFRC 0.61 0.36  --  0.24* 0.15* 0.36  CREATININE  --  0.91 0.88 0.79  --   --      Assessment: 68 year old male on Xarelto prior to admission for atrial fibrillation who presented post fall and was transitioned to Heparin IV due to need for possible lumbar surgery.   Heparin level is therapeutic; no bleeding reported.  Goal of Therapy:  Heparin level 0.3-0.7 units/ml Monitor platelets by anticoagulation protocol: Yes  Plan:  Increase heparin gtt slightly to 2350 units/hr to prevent it from dropping again F/U AM labs  Raheim Beutler D. Mina Marble, PharmD, BCPS, [place] 05/20/2019, 8:37 PM

## 2019-05-20 NOTE — Progress Notes (Signed)
ANTICOAGULATION CONSULT NOTE  Pharmacy Consult for Heparin Indication: atrial fibrillation   Patient Measurements: Height: 5\' 9"  (175.3 cm) Weight: 267 lb (121.1 kg) IBW/kg (Calculated) : 70.7 Heparin Dosing Weight: 98 kg  Vital Signs: Temp: 98.7 F (37.1 C) (11/11 2321) Temp Source: Oral (11/11 1707) BP: 114/57 (11/11 2321) Pulse Rate: 74 (11/11 2321)  Labs: Recent Labs    05/17/19 0521 05/18/19 0500 05/19/19 0942 05/19/19 1516 05/20/19 0406  HGB 12.4* 11.3* 10.5*  --  9.4*  HCT 38.4* 35.4* 32.0*  --  29.6*  PLT 114* 123* 159  --  184  HEPARINUNFRC 0.70 0.61 0.36  --  0.24*  CREATININE 0.79  --  0.91 0.88  --      Assessment: 68 year old male on Xarelto prior to admission for atrial fibrillation who presented post fall and was transitioned to Heparin IV due to need for possible lumbar surgery.   Heparin level is subtherapeutic on current rate of 2000 units/hr.  Hemoglobin is trending down slowly at 9.4. Platelets improved up to 184.  No bleeding reported.    Goal of Therapy:  Heparin level 0.3-0.7 units/ml Monitor platelets by anticoagulation protocol: Yes    Plan:  - Increase Heparin at 2150 units/hr - Daily HL, CBC - Follow-up TEE on Thursday  Alanda Slim, PharmD, [facility] Clinical Pharmacist Please see AMION for all Pharmacists' Contact Phone Numbers 05/20/2019, 4:52 AM

## 2019-05-21 ENCOUNTER — Inpatient Hospital Stay (HOSPITAL_COMMUNITY): Payer: Medicare Other

## 2019-05-21 ENCOUNTER — Encounter (HOSPITAL_COMMUNITY): Payer: Self-pay | Admitting: Cardiology

## 2019-05-21 DIAGNOSIS — S32009A Unspecified fracture of unspecified lumbar vertebra, initial encounter for closed fracture: Secondary | ICD-10-CM | POA: Diagnosis not present

## 2019-05-21 DIAGNOSIS — S32012A Unstable burst fracture of first lumbar vertebra, initial encounter for closed fracture: Secondary | ICD-10-CM | POA: Diagnosis not present

## 2019-05-21 DIAGNOSIS — R7881 Bacteremia: Secondary | ICD-10-CM | POA: Diagnosis not present

## 2019-05-21 DIAGNOSIS — I482 Chronic atrial fibrillation, unspecified: Secondary | ICD-10-CM | POA: Diagnosis not present

## 2019-05-21 DIAGNOSIS — I5022 Chronic systolic (congestive) heart failure: Secondary | ICD-10-CM | POA: Diagnosis not present

## 2019-05-21 LAB — CBC
HCT: 28.2 % — ABNORMAL LOW (ref 39.0–52.0)
Hemoglobin: 9.4 g/dL — ABNORMAL LOW (ref 13.0–17.0)
MCH: 28.5 pg (ref 26.0–34.0)
MCHC: 33.3 g/dL (ref 30.0–36.0)
MCV: 85.5 fL (ref 80.0–100.0)
Platelets: 200 10*3/uL (ref 150–400)
RBC: 3.3 MIL/uL — ABNORMAL LOW (ref 4.22–5.81)
RDW: 15.6 % — ABNORMAL HIGH (ref 11.5–15.5)
WBC: 9.6 10*3/uL (ref 4.0–10.5)
nRBC: 0 % (ref 0.0–0.2)

## 2019-05-21 LAB — GLUCOSE, CAPILLARY
Glucose-Capillary: 144 mg/dL — ABNORMAL HIGH (ref 70–99)
Glucose-Capillary: 147 mg/dL — ABNORMAL HIGH (ref 70–99)
Glucose-Capillary: 150 mg/dL — ABNORMAL HIGH (ref 70–99)
Glucose-Capillary: 151 mg/dL — ABNORMAL HIGH (ref 70–99)
Glucose-Capillary: 162 mg/dL — ABNORMAL HIGH (ref 70–99)

## 2019-05-21 LAB — BASIC METABOLIC PANEL
Anion gap: 9 (ref 5–15)
BUN: 12 mg/dL (ref 8–23)
CO2: 27 mmol/L (ref 22–32)
Calcium: 7.9 mg/dL — ABNORMAL LOW (ref 8.9–10.3)
Chloride: 94 mmol/L — ABNORMAL LOW (ref 98–111)
Creatinine, Ser: 0.66 mg/dL (ref 0.61–1.24)
GFR calc Af Amer: >60 mL/min (ref >60)
GFR calc non Af Amer: >60 mL/min (ref >60)
Glucose, Bld: 130 mg/dL — ABNORMAL HIGH (ref 70–99)
Potassium: 4.8 mmol/L (ref 3.5–5.1)
Sodium: 130 mmol/L — ABNORMAL LOW (ref 135–145)

## 2019-05-21 LAB — HEPARIN LEVEL (UNFRACTIONATED): Heparin Unfractionated: 0.45 IU/mL (ref 0.30–0.70)

## 2019-05-21 MED ORDER — SENNOSIDES-DOCUSATE SODIUM 8.6-50 MG PO TABS
2.0000 | ORAL_TABLET | Freq: Two times a day (BID) | ORAL | Status: DC
  Administered 2019-05-21 – 2019-05-31 (×13): 2
  Filled 2019-05-21 (×15): qty 2

## 2019-05-21 MED ORDER — JEVITY 1.2 CAL PO LIQD
1000.0000 mL | ORAL | Status: DC
  Administered 2019-05-21 – 2019-05-24 (×4): 1000 mL
  Filled 2019-05-21 (×6): qty 1000

## 2019-05-21 MED ORDER — INSULIN ASPART 100 UNIT/ML ~~LOC~~ SOLN
0.0000 [IU] | SUBCUTANEOUS | Status: DC
  Administered 2019-05-21: 3 [IU] via SUBCUTANEOUS
  Administered 2019-05-22: 5 [IU] via SUBCUTANEOUS
  Administered 2019-05-22: 2 [IU] via SUBCUTANEOUS
  Administered 2019-05-22: 3 [IU] via SUBCUTANEOUS
  Administered 2019-05-22 (×2): 5 [IU] via SUBCUTANEOUS
  Administered 2019-05-22: 2 [IU] via SUBCUTANEOUS
  Administered 2019-05-23 (×2): 5 [IU] via SUBCUTANEOUS
  Administered 2019-05-23: 8 [IU] via SUBCUTANEOUS

## 2019-05-21 MED ORDER — VITAMIN C 500 MG/5ML PO SYRP
500.0000 mg | ORAL_SOLUTION | Freq: Every day | ORAL | Status: DC
  Administered 2019-05-21 – 2019-06-18 (×28): 500 mg
  Filled 2019-05-21 (×29): qty 5

## 2019-05-21 MED ORDER — ZINC SULFATE 220 (50 ZN) MG PO CAPS
220.0000 mg | ORAL_CAPSULE | Freq: Every day | ORAL | Status: DC
  Administered 2019-05-22 – 2019-05-28 (×6): 220 mg
  Filled 2019-05-21 (×6): qty 1

## 2019-05-21 MED ORDER — LORATADINE 10 MG PO TABS
10.0000 mg | ORAL_TABLET | Freq: Every evening | ORAL | Status: DC
  Administered 2019-05-21 – 2019-06-17 (×28): 10 mg
  Filled 2019-05-21 (×28): qty 1

## 2019-05-21 MED ORDER — CARVEDILOL 3.125 MG PO TABS
3.1250 mg | ORAL_TABLET | Freq: Two times a day (BID) | ORAL | Status: DC
  Administered 2019-05-21 – 2019-05-26 (×10): 3.125 mg
  Filled 2019-05-21 (×10): qty 1

## 2019-05-21 MED ORDER — DOCUSATE SODIUM 50 MG/5ML PO LIQD
100.0000 mg | Freq: Every day | ORAL | Status: DC
  Administered 2019-05-21 – 2019-05-31 (×10): 100 mg
  Filled 2019-05-21 (×10): qty 10

## 2019-05-21 MED ORDER — TORSEMIDE 20 MG PO TABS
20.0000 mg | ORAL_TABLET | Freq: Every day | ORAL | Status: DC
  Administered 2019-05-21: 20 mg
  Filled 2019-05-21: qty 1

## 2019-05-21 MED ORDER — PRO-STAT SUGAR FREE PO LIQD
30.0000 mL | Freq: Three times a day (TID) | ORAL | Status: DC
  Administered 2019-05-21 – 2019-05-24 (×10): 30 mL
  Filled 2019-05-21 (×12): qty 30

## 2019-05-21 MED ORDER — ADULT MULTIVITAMIN W/MINERALS CH
1.0000 | ORAL_TABLET | Freq: Every day | ORAL | Status: DC
  Administered 2019-05-22 – 2019-06-18 (×27): 1
  Filled 2019-05-21 (×27): qty 1

## 2019-05-21 MED ORDER — PRO-STAT SUGAR FREE PO LIQD: 30.0000 mL | Freq: Three times a day (TID) | ORAL | Status: DC

## 2019-05-21 MED ORDER — ACETAMINOPHEN 160 MG/5ML PO SOLN
650.0000 mg | Freq: Four times a day (QID) | ORAL | Status: DC | PRN
  Administered 2019-05-31 – 2019-06-17 (×7): 650 mg
  Filled 2019-05-21 (×11): qty 20.3

## 2019-05-21 NOTE — Progress Notes (Signed)
    [facility] for Infectious Disease   Reason for visit: Follow up on bacteremia  Interval History: REpeat blood cultures remained negative.  WBC now wnl.  Remains afebrile.  No acute events. TEE negative for vegetaation.   Physical Exam: Constitutional: alert, nad Vitals:   05/20/19 2300 05/21/19 0757  BP: (!) 122/52 121/60  Pulse: 63 (!) 55  Resp: 20 16  Temp: 98.8 F (37.1 C) (!) 97.5 F (36.4 C)  SpO2: 100% 100%  HENT: no thrush CV: RRR Lungs: CTA B; normal respiratory effort Abd: soft, nt MS: no edema   Lab Results  Component Value Date   WBC 9.6 05/21/2019   HGB 9.4 (L) 05/21/2019   HCT 28.2 (L) 05/21/2019   MCV 85.5 05/21/2019   PLT 200 05/21/2019    Lab Results  Component Value Date   CREATININE 0.66 05/21/2019   BUN 12 05/21/2019   NA 130 (L) 05/21/2019   K 4.8 05/21/2019   CL 94 (L) 05/21/2019   CO2 27 05/21/2019    Lab Results  Component Value Date   ALT 19 05/19/2019   AST 41 05/19/2019   ALKPHOS 65 05/19/2019     Microbiology: No results found for this or any previous visit (from the past 240 hour(s)).  Impression/Plan:   1.  Bacteremia - negative for vegetation on PV and clearing.  Will continue with IV penicillin through 11/16 and from an ID standpoint will be cleared for surgery.    2.  Fractured back - noted plan for surgery next week.    3.  Leukocytosis - resolved, now wnl.    I will sign off, call with any questions.

## 2019-05-21 NOTE — Procedures (Signed)
Cortrak  Person Inserting Tube:  Rosezetta Schlatter, RD Tube Type:  Cortrak - 43 inches Tube Location:  Right nare Initial Placement:  Stomach Secured by: Bridle Technique Used to Measure Tube Placement:  Documented cm marking at nare/ corner of mouth Cortrak Secured At:  70 cm Procedure Comments:  Cortrak Tube Team Note:  Consult received to place a Cortrak feeding tube.   No x-ray is required. RN may begin using tube.    If the tube becomes dislodged please keep the tube and contact the Cortrak team at [email or web address] (password [email or web address]) for replacement.  If after hours and replacement cannot be delayed, place a NG tube and confirm placement with an abdominal x-ray.     Jarome Matin, MS, RD, LDN, [facility] Inpatient Clinical Dietitian Pager # [phone] After hours/weekend pager # [phone]

## 2019-05-21 NOTE — Progress Notes (Signed)
Nutrition Follow-up  DOCUMENTATION CODES:   Obesity unspecified  INTERVENTION:    Jevity 1.2 at 25 ml/h, increase by 10 ml every 4 hours to goal rate of 65 ml/h.  Pro-stat 30 ml TID.  Provides 2172 kcal, 132 gm protein, 1264 ml free water daily.  Continue multivitamin with minerals daily, crushed via tube.  Recommend obtain new weight (last recorded 10/29).  NUTRITION DIAGNOSIS:   Increased nutrient needs related to wound healing, acute illness as evidenced by estimated needs.  Ongoing.  GOAL:   Patient will meet greater than or equal to 90% of their needs  Progressing.  MONITOR:   PO intake, Supplement acceptance, Labs, Weight trends, I & O's, Skin  ASSESSMENT:   68 y.o. 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  Patient fell onto his buttock.Patient is being admitted for further management of lumbar fracture.  S/P bedside swallow evaluation with SLP this morning. Patient is now NPO due to severe aspiration risk related to structural changes in the cervical spine, lethargy, and weakness. Cortrak tube placed this morning, tip in the stomach. RD to order TF per secure chat with MD.   Admission weight: 267 lbs. No new weights have been recorded.  I/Os: -9.2 L since admit  Medications: Colace, Novolog SSI, Lantus, Lokelma, Multivitamin with minerals daily, Senokot-S, Vitamin C tablet, Zinc sulfate capsule.  Labs reviewed: CBGs: 144-151 Low Na 130   Diet Order:   Diet Order            Diet NPO time specified  Diet effective now              EDUCATION NEEDS:   No education needs have been identified at this time  Skin:  Skin Assessment: Skin Integrity Issues: Skin Integrity Issues:: Stage II Stage II: right & left buttocks  Last BM:  11/8 -type 6  Height:   Ht  Readings from Last 1 Encounters:  05/06/19 5\' 9"  (1.753 m)    Weight:   Wt Readings from Last 1 Encounters:  05/06/19 121.1 kg  No new weight since admission  Ideal Body Weight:  72.7 kg  BMI:  Body mass index is 39.43 kg/m.  Estimated Nutritional Needs:   Kcal:  2100-2300  Protein:  120-140 gm  Fluid:  2.1L/day   Molli Barrows, RD, LDN, [place] Pager [phone] After Hours Pager [phone]

## 2019-05-21 NOTE — Progress Notes (Signed)
ANTICOAGULATION CONSULT NOTE  Pharmacy Consult for Heparin Indication: atrial fibrillation   Patient Measurements: Height: 5\' 9"  (175.3 cm) Weight: 267 lb (121.1 kg) IBW/kg (Calculated) : 70.7 Heparin Dosing Weight: 98 kg  Vital Signs: Temp: 97.5 F (36.4 C) (11/13 0757) Temp Source: Oral (11/13 0757) BP: 121/60 (11/13 0757) Pulse Rate: 55 (11/13 0757)  Labs: Recent Labs    05/19/19 0942 05/19/19 1516 05/20/19 0406 05/20/19 1107 05/20/19 1947 05/21/19 0340  HGB 10.5*  --  9.4*  --   --  9.4*  HCT 32.0*  --  29.6*  --   --  28.2*  PLT 159  --  184  --   --  200  HEPARINUNFRC 0.36  --  0.24* 0.15* 0.36 0.45  CREATININE 0.91 0.88 0.79  --   --  0.66     Assessment: 68 year old male on Xarelto prior to admission for atrial fibrillation who presented post fall and was transitioned to Heparin IV due to need for possible lumbar surgery.   Heparin level is therapeutic for two levels on current rate of 2350 units/hr.  Hemoglobin remains stable. Platelets are trending up at 200 and are within normal limits.  No bleeding reported.  Goal of Therapy:  Heparin level 0.3-0.7 units/ml Monitor platelets by anticoagulation protocol: Yes  Plan:  Continue heparin drip at 2350 units/hr. Continue to monitor daily heparin level and CBC while on therapy.  Follow-up surgical plans.     Sloan Leiter, PharmD, BCPS, BCCCP Clinical Pharmacist Please refer to [facility] for [place] numbers 05/21/2019, 8:48 AM

## 2019-05-21 NOTE — Progress Notes (Signed)
[facility]  PROGRESS NOTE  Cody Hale LKT:[id number] DOB: 1951/03/12 DOA: 05/06/2019 PCP: Cody Lima, MD Admit date - 05/06/2019   Admitting Physician Cody Friendly, MD  Outpatient Primary MD for the patient is Cody Lima, MD  LOS - 53 Brief Narrative   Cody Hale is a 68 y.o. 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  Hospital course complicated by [id number] infection positive with no symptoms though he did complete 5 days of remdesivir and full course of heparin drip GBS bacteremia IV penicillin, currently awaiting TEE results  Subjective  [name] today has recently returned from TEE and is bit sleepy but appropriate on exam. Has a wet sounding cough. No fevers or chills. No nausea. Minimal appetite. Persistent back pain  A & P   1. Unstable L1-L2 fracture. 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  Neurosurgery plans for operative stabilization pending stable clinically possibly next Tuesday after completion of IV penicillin on 11/16  2. GBS bacteremia with history of mitral prosthetic valve, stable. On penicillin till 11/16 given no vegetations on TEE per ID recs. Remains afebrile, white count trending down and no recurrent bacteremia.   3. COVID 19 pneumonia seems resolved. Repeat CXR shows resolution of consolidation/opacities. Ferritin wnl, CRP elevated but has concern for ankylosing spondylitis. CXR more consistent with likely pulm edema.  Exam continue vitamin C and zinc,   4. Hyponatremia with normal serum osm ( 281), improving.  Sodium  stable at 130 after r ( nadir of 127 ),  Continue to monitor BMP.  5. Hyperkalemia, intermittent. Previously responded to lokelma, K wnl today, monitor BMP.  6. Pharyngeal dysphagia and possible aspiration per Speech eval NPO, cortrak placed on 11/13, feeding tube via cortrak. CXR shows no pna   7. CHF with reduced EF (EF 40-45%, TTE 05/20/19), mild exacerbation, appears compensated but CXR shows interval development of mild interstitial pulmonary edema.  Positive fluid output today but has not been able to safely take oral meds, will monitor on oral torsemide given via NG if doesn't improve will switch back to IV lasix and closely monitor daily weights, strict I/o, bmp.   continue fluid restriction, continue Coreg  8. Chronic atrial fibrillation, rate controlled.  Holding home Xarelto, continue heparin drip given will likely need neurosurgical intervention once infection status stable.  9. Type 2 diabetes, continue Lantus 45 units CBGs at goal (reduced from home regimen of Tresiba 50 units daily), NovoLog scheduled 8 units 3 times daily, sliding scale as needed.  Holding home [place]  10. Thrombocytopenia, resolved.   no active signs of bleeding, monitor CBC.  11. GERD, stable.  Continue ProtonixDiminished appetite.  Continue Ensure twice daily  12. COPD, stable.  Continue Incruse.     Family Communication  : Spoke with son Cody Hale at [phone] on 05/20/19  Code Status :  FULL  Disposition Plan  : Continue inpatient stay, IV penicillin until 11/16, then plan for or intervention for fx's by neurosurgery  Consults  : ID, neurosurgery, cardiology Procedures  : 10/31 venous duplex TEE, 05/20/19 pending results  DVT Prophylaxis  : IV Heparin  Lab Results  Component Value Date   PLT 200 05/21/2019    Diet :  Diet Order            Diet NPO time specified  Diet effective now               Inpatient Medications Scheduled Meds:  ascorbic acid  500 mg Per Tube Daily     carvedilol  3.125 mg Per Tube BID   Chlorhexidine Gluconate Cloth  6 each Topical Daily   docusate  100 mg Per Tube Daily   feeding supplement (PRO-STAT SUGAR FREE 64)  30 mL Per Tube TID   fluticasone  2 spray Each Nare Daily   fluticasone furoate-vilanterol  1 puff Inhalation Daily   insulin aspart  0-5 Units Subcutaneous QHS   insulin aspart  0-9 Units Subcutaneous TID WC   insulin aspart  8 Units Subcutaneous TID WC   insulin glargine  45 Units Subcutaneous Daily   loratadine  10 mg Per Tube QPM   [START ON 05/22/2019] multivitamin with minerals  1 tablet Per Tube Daily   pantoprazole  40 mg Oral Daily   polyethylene glycol  17 g Oral Daily   senna-docusate  2 tablet Per Tube BID   sodium zirconium cyclosilicate  5 g Oral Once   torsemide  20 mg Per Tube q1800   umeclidinium bromide  1 puff Inhalation Daily   [START ON 05/22/2019] zinc sulfate  220 mg Per Tube Daily   Continuous Infusions:  feeding supplement (JEVITY 1.2 CAL)     heparin 2,350 Units/hr (05/21/19 0452)   penicillin g continuous IV infusion 12 Million Units (05/21/19 0854)   PRN Meds:.acetaminophen, [DISCONTINUED] acetaminophen **OR** acetaminophen, chlorpheniramine-HYDROcodone, guaiFENesin-dextromethorphan, hydrALAZINE, lidocaine (PF), metoprolol tartrate, morphine injection, nitroGLYCERIN, ondansetron **OR** ondansetron (ZOFRAN) IV, oxyCODONE  Antibiotics  :   Anti-infectives (From admission, onward)   Start     Dose/Rate Route Frequency Ordered Stop   05/15/19 1430  penicillin G potassium 12 Million Units in dextrose 5 % 500 mL continuous infusion     12 Million Units 41.7 mL/hr over 12 Hours Intravenous Every 12 hours 05/15/19 1343     05/13/19 1000  penicillin G potassium 12 Million Units in dextrose 5 % 500 mL continuous infusion  Status:  Discontinued     12 Million Units 41.7 mL/hr over 12 Hours Intravenous Every 12 hours 05/12/19 1159 05/15/19 1343   05/11/19 0730  cefTRIAXone  (ROCEPHIN) 2 g in sodium chloride 0.9 % 100 mL IVPB  Status:  Discontinued     2 g 200 mL/hr over 30 Minutes Intravenous Every 24 hours 05/11/19 0721 05/12/19 1159   05/08/19 0200  remdesivir 100 mg in sodium chloride 0.9 % 250 mL IVPB     100 mg 500 mL/hr over 30 Minutes Intravenous Every 24 hours 05/07/19 0059 05/11/19 0513   05/07/19 0100  remdesivir 200 mg in sodium chloride 0.9 % 250 mL IVPB     200 mg 500 mL/hr over 30 Minutes Intravenous Once 05/07/19 0057 05/07/19 0512       Objective   Vitals:   05/20/19 1320 05/20/19 1640 05/20/19 2300 05/21/19 0757  BP: (!) 105/52 (!) 124/52 (!) 122/52 121/60  Pulse: (!) 55 60 63 (!) 55  Resp: (!) _0 Temp:  98.9 F (37.2 C) 98.8 F (37.1 C) (!) 97.5 F (36.4 C)  TempSrc:  Oral Oral Oral  SpO2: 98% 100% 100% 100%  Weight:      Height:  SpO2: 100 % O2 Flow Rate (L/min): 3 L/min FiO2 (%): 32 %  Wt Readings from Last 3 Encounters:  05/06/19 121.1 kg  03/24/19 120.7 kg  02/16/19 117.9 kg     Intake/Output Summary (Last 24 hours) at 05/21/2019 1607 Last data filed at 05/21/2019 0625 Gross per 24 hour  Intake 1224.39 ml  Output 950 ml  Net 274.39 ml    Physical Exam:  Oriented X 3, sleepy but arousable No new F.N deficits,  [place].AT, No JVD appreciated Symmetrical Chest wall movement, Good air movement bilaterally, no respiratory distress on 3 L RRR,No Gallops,Rubs or new Murmurs,  +ve B.Sounds, Abd Soft, No tenderness, No rebound, guarding or rigidity. No Cyanosis, Clubbing or edema,  No new Rash or bruise     I have personally reviewed the following:   Data Reviewed:  CBC Recent Labs  Lab 05/17/19 0521 05/18/19 0500 05/19/19 0942 05/20/19 0406 05/21/19 0340  WBC 13.5* 20.8* 13.5* 12.4* 9.6  HGB 12.4* 11.3* 10.5* 9.4* 9.4*  HCT 38.4* 35.4* 32.0* 29.6* 28.2*  PLT 114* 123* 159 184 200  MCV 84.6 86.8 86.0 85.8 85.5  MCH 27.3 27.7 28.2 27.2 28.5  MCHC 32.3 31.9 32.8 31.8 33.3  RDW 15.1 15.3  15.4 15.5 15.6*    Chemistries  Recent Labs  Lab 05/15/19 0334 05/17/19 0521 05/19/19 0942 05/19/19 1516 05/20/19 0406 05/21/19 0340  NA 132* 129* 127* 128* 130* 130*  K 5.3* 4.6 5.3* 4.9 5.4* 4.8  CL 99 96* 93* 94* 96* 94*  CO2 _0 GLUCOSE 283* 194* 207* 182* 152* 130*  BUN _1 CREATININE 0.80 0.79 0.91 0.88 0.79 0.66  CALCIUM 8.3* 8.1* 7.9* 7.7* 7.9* 7.9*  AST 15 20 41  --   --   --   ALT _2 --   --   --   ALKPHOS 64 62 65  --   --   --   BILITOT 1.6* 1.1 1.2  --   --   --    ------------------------------------------------------------------------------------------------------------------ Recent Labs    05/20/19 0406  LDLDIRECT 41.1    Lab Results  Component Value Date   HGBA1C 7.4 (H) 03/24/2019   ------------------------------------------------------------------------------------------------------------------ No results for input(s): TSH, T4TOTAL, T3FREE, THYROIDAB in the last 72 hours.  Invalid input(s): FREET3 ------------------------------------------------------------------------------------------------------------------ Recent Labs    05/19/19 0942  FERRITIN 287    Coagulation profile No results for input(s): INR, PROTIME in the last 168 hours.  No results for input(s): DDIMER in the last 72 hours.  Cardiac Enzymes No results for input(s): CKMB, TROPONINI, MYOGLOBIN in the last 168 hours.  Invalid input(s): CK ------------------------------------------------------------------------------------------------------------------    Component Value Date/Time   BNP 326.0 (H) 05/06/2019 1552    Micro Results No results found for this or any previous visit (from the past 240 hour(s)).  Radiology Reports Dg Pelvis 1-2 Views  Result Date: 05/06/2019 CLINICAL DATA:  Low back pain after fall. EXAM: PELVIS - 1-2 VIEW COMPARISON:  Pelvic and right hip x-rays dated September 08, 2018. FINDINGS: There is no evidence of pelvic  fracture or diastasis. Prior right total hip arthroplasty. Unchanged moderate left hip osteoarthritis. IMPRESSION: No acute osseous abnormality. Electronically Signed   By: Titus Dubin M.D.   On: 05/06/2019 19:24   Dg Forearm Right  Result Date: 05/06/2019 CLINICAL DATA:  Right forearm pain after fall. EXAM: RIGHT FOREARM - 2 VIEW COMPARISON:  Right wrist and elbow x-rays  dated May 16, 2018. FINDINGS: There is no evidence of fracture or other focal bone lesions. Soft tissues are unremarkable. IMPRESSION: Negative. Electronically Signed   By: Titus Dubin M.D.   On: 05/06/2019 19:13   Dg Abd 1 View  Result Date: 05/15/2019 CLINICAL DATA:  Evaluate for constipation. EXAM: ABDOMEN - 1 VIEW COMPARISON:  CT abdomen pelvis 04/11/2018. FINDINGS: Single AP supine radiograph of the abdomen through the pubic symphysis. The lung bases are excluded from field of view. Gastric distention. There are no dilated loops of bowel to suggest obstruction. Mild stool burden. No supine evidence for free air. Status post right hip arthroplasty, incompletely visualized. IMPRESSION: Gastric distention. Nonobstructive bowel gas pattern. Mild stool burden. Electronically Signed   By: Audie Pinto M.D.   On: 05/15/2019 18:25   Ct Lumbar Spine Wo Contrast  Result Date: 05/11/2019 CLINICAL DATA:  Lumbar fracture.  Surgical planning. EXAM: CT LUMBAR SPINE WITHOUT CONTRAST TECHNIQUE: Multidetector CT imaging of the lumbar spine was performed without intravenous contrast administration. Multiplanar CT image reconstructions were also generated. COMPARISON:  05/06/2019 FINDINGS: The study is mildly motion degraded despite repeat imaging. Segmentation: 5 lumbar type vertebrae. Alignment: Trace retrolisthesis of L5 on S1. Vertebrae: Diffuse ankylosis is again noted throughout the lumbar and included lower thoracic spine as well as across both SI joints. A transverse fracture is again seen through the L1-2 disc space  including across the bridging osteophytes/syndesmophytes. Posterior element extension was better demonstrated on the prior study due to motion and image noise on today's examination. Anterior widening of the L1-2 disc space is unchanged. No new fracture is identified. Paraspinal and other soft tissues: Small pleural effusions and dependent atelectasis in the lung bases. Aortic atherosclerosis. Partially visualized right hip arthroplasty. Disc levels: Disc space narrowing greatest at L5-S1 where endplate and facet spurring result in severe bilateral neural foraminal stenosis. IMPRESSION: 1. Lumbar spine CT for surgical planning. 2. Findings suggestive of ankylosing spondylitis with fracture through the L1-2 disc space as previously described. Unchanged anterior disc space widening. 3.  Aortic Atherosclerosis (ICD10-I70.0). Electronically Signed   By: Logan Bores M.D.   On: 05/11/2019 14:04   Ct Lumbar Spine Wo Contrast  Result Date: 05/06/2019 CLINICAL DATA:  Tender to palpation over the lower lumbar spine, fall EXAM: CT LUMBAR SPINE WITHOUT CONTRAST TECHNIQUE: Multidetector CT imaging of the lumbar spine was performed without intravenous contrast administration. Multiplanar CT image reconstructions were also generated. COMPARISON:  CT April 11, 2018 FINDINGS: Segmentation: There are 5 non-rib bearing lumbar type vertebral bodies with the last intervertebral disc space labeled as L5-S1. Alignment: Normal Vertebrae: There is a fracture seen through the anterior bridging osteophyte of L1 with widening of the anterior inter disc space which measures 1.6 cm. The fracture extends through the right posterior bridging osteophyte and pedicle as well as through the left-sided pedicle and superior articulating facet. There is also a minimally displaced fracture seen at the right-sided inferior corner of the L1 vertebral body. The fracture also extends through the L1 spinous process. There is minimal right-sided facet  joint widening seen at L1-L2. No jumped facets however are noted. A small amount of air seen within the inter disc space at L1-L2. No other definite fracture is identified. There are bridging anterior osteophytes seen through the remainder of the lumbar spine. There inter disc calcifications. There is findings of of diffuse enthesopathy seen between the spinous processes in the lumbar spine. Paraspinal and other soft tissues: Mild prevertebral soft tissue swelling is  seen at L1-L2. No large hematoma however is noted. The sacroiliac joints are intact. There is diffuse ankylosis seen around the bilateral sacroiliac joints. Scattered aortic atherosclerosis is noted. Disc levels: Degenerative changes are seen throughout the lumbar spine with disc height loss and facet arthropathy which is most notable at L5-S1 with severe bilateral neural foraminal narrowing. IMPRESSION: 1. Fracture through the anterior bridging osteophyte of L1-2 with widening of the inter disc space . The fracture extends through the posterior columns at L1, as described above through the pedicles, facet, and spinous process. 2. Mild prevertebral soft tissue swelling is seen at this level. 3. Findings suggestive of ankylosing spondylitis. 4.  Aortic Atherosclerosis (ICD10-I70.0). These results were called by telephone at the time of interpretation on 05/06/2019 at 4:56 pm to provider [facility] , who verbally acknowledged these results. Electronically Signed   By: Prudencio Pair M.D.   On: 05/06/2019 17:02   Dg Chest Port 1 View  Result Date: 05/21/2019 CLINICAL DATA:  Dyspnea and aspiration EXAM: PORTABLE CHEST 1 VIEW COMPARISON:  May 18, 2019 FINDINGS: The mediastinal contour and cardiac silhouette are stable. A feeding tube is identified with distal tip not included on film. Right central venous line is identified with distal tip in the superior vena cava. The heart size is enlarged. Increased pulmonary interstitium is identified  bilaterally. There is no focal pneumonia or pleural effusion. The bony structures are stable. IMPRESSION: Cardiomegaly with mild interstitial edema. Electronically Signed   By: Abelardo Diesel M.D.   On: 05/21/2019 12:31   Dg Chest Port 1 View  Result Date: 05/18/2019 CLINICAL DATA:  Pneumonia. EXAM: PORTABLE CHEST 1 VIEW COMPARISON:  May 15, 2019. FINDINGS: Stable cardiomegaly. No pneumothorax or pleural effusion is noted. Right-sided PICC line is noted with distal tip in expected position of cavoatrial junction. No acute pulmonary disease is noted. Bony thorax is unremarkable. IMPRESSION: Right-sided PICC line is been repositioned with tip in expected position of cavoatrial junction. No acute cardiopulmonary abnormality seen. Electronically Signed   By: Marijo Conception M.D.   On: 05/18/2019 10:52   Dg Chest Port 1 View  Result Date: 05/15/2019 CLINICAL DATA:  68 year old male with a history of PICC placement EXAM: PORTABLE CHEST 1 VIEW COMPARISON:  None. FINDINGS: Cardiomediastinal silhouette unchanged in size and contour with surgical changes of median sternotomy and cardiomegaly. No pneumothorax. No pleural effusion. Coarsened interstitial markings throughout. Right upper extremity PICC is redundant, without visualization of the point of termination of the tip. Redemonstration of partially healed right-sided rib fractures IMPRESSION: Right upper extremity PICC is redundant within the veins in the tip is not visualized. Repositioning is indicated. Similar appearance of the lungs with diffuse reticulonodular opacities. Similar appearance of surgical changes of median sternotomy and cardiomegaly. Electronically Signed   By: Corrie Mckusick D.O.   On: 05/15/2019 11:13   Dg Chest Port 1 View  Result Date: 05/10/2019 CLINICAL DATA:  68 year old male positive for COVID-19. Fever. EXAM: PORTABLE CHEST 1 VIEW COMPARISON:  Portable chest 05/06/2019 and earlier. FINDINGS: Portable AP semi upright view at 1645  hours. Chronic cardiomegaly. Mildly lower lung volumes compared to October. New bilateral basilar predominant confluent and indistinct pulmonary opacity. No superimposed pneumothorax or pleural effusion. Prior sternotomy. No acute osseous abnormality identified. IMPRESSION: 1. New from 05/06/2019 bilateral basilar predominant bilateral pulmonary opacity compatible with COVID-19 pneumonia. 2. Chronic cardiomegaly. Electronically Signed   By: Genevie Ann M.D.   On: 05/10/2019 17:04   Dg Chest Portable 1 View  Result Date: 05/06/2019 CLINICAL DATA:  Fall. EXAM: PORTABLE CHEST 1 VIEW COMPARISON:  Chest x-ray dated January 13, 2019. FINDINGS: Stable cardiomegaly. Pulmonary vascular congestion. No focal consolidation, pleural effusion, or pneumothorax. No acute osseous abnormality. Old right-sided rib fractures again noted. IMPRESSION: Cardiomegaly with pulmonary vascular congestion. Electronically Signed   By: Titus Dubin M.D.   On: 05/06/2019 19:11   Vas [place] Lower Extremity Venous (dvt)  Result Date: 05/08/2019  Lower Venous Study Indications: Covid positive, elevated D-Dimer. Has unstable lumbar fracture.  Limitations: Patient in severe pain with compression and body habitus. Comparison Study: No prior study on file for comparison Performing Technologist: Sharion Dove RVS  Examination Guidelines: A complete evaluation includes B-mode imaging, spectral Doppler, color Doppler, and power Doppler as needed of all accessible portions of each vessel. Bilateral testing is considered an integral part of a complete examination. Limited examinations for reoccurring indications may be performed as noted.  +---------+---------------+---------+-----------+----------+--------------+  RIGHT     Compressibility Phasicity Spontaneity Properties Thrombus Aging  +---------+---------------+---------+-----------+----------+--------------+  CFV       Full            Yes       Yes                                     +---------+---------------+---------+-----------+----------+--------------+  SFJ       Full                                                             +---------+---------------+---------+-----------+----------+--------------+  FV Prox   Full                                                             +---------+---------------+---------+-----------+----------+--------------+  FV Mid    Full                                                             +---------+---------------+---------+-----------+----------+--------------+  FV Distal Full                                                             +---------+---------------+---------+-----------+----------+--------------+  PFV       Full                                                             +---------+---------------+---------+-----------+----------+--------------+  POP       Full  Yes       Yes                                    +---------+---------------+---------+-----------+----------+--------------+  PTV       Full                                                             +---------+---------------+---------+-----------+----------+--------------+  PERO      Full                                                             +---------+---------------+---------+-----------+----------+--------------+   +---------+---------------+---------+-----------+----------+--------------+  LEFT      Compressibility Phasicity Spontaneity Properties Thrombus Aging  +---------+---------------+---------+-----------+----------+--------------+  CFV       Full            Yes       Yes                                    +---------+---------------+---------+-----------+----------+--------------+  SFJ       Full                                                             +---------+---------------+---------+-----------+----------+--------------+  FV Prox   Full                                                              +---------+---------------+---------+-----------+----------+--------------+  FV Mid    Full                                                             +---------+---------------+---------+-----------+----------+--------------+  FV Distal Full                                                             +---------+---------------+---------+-----------+----------+--------------+  PFV       Full                                                             +---------+---------------+---------+-----------+----------+--------------+  POP       Full            Yes       Yes                                    +---------+---------------+---------+-----------+----------+--------------+  PTV       Full                                                             +---------+---------------+---------+-----------+----------+--------------+  PERO      Full                                                             +---------+---------------+---------+-----------+----------+--------------+     Summary: Right: There is no evidence of deep vein thrombosis in the lower extremity. Left: There is no evidence of deep vein thrombosis in the lower extremity.  *See table(s) above for measurements and observations. Electronically signed by Deitra Mayo MD on 05/08/2019 at 5:56:44 PM.    Final    Ir Picc Placement Right >5 Yrs Inc Img Guide  Result Date: 05/17/2019 INDICATION: BACTEREMIA, ACCESS FOR LONG-TERM ANTIBIOTICS EXAM: ULTRASOUND AND FLUOROSCOPIC GUIDED PICC LINE INSERTION MEDICATIONS: 1% LIDOCAINE LOCAL CONTRAST:  None FLUOROSCOPY TIME:  120 seconds (12 mGy) COMPLICATIONS: None immediate. TECHNIQUE: The procedure, risks, benefits, and alternatives were explained to the patient's family and informed written consent was obtained. A timeout was performed prior to the initiation of the procedure. The right upper extremity was prepped with chlorhexidine in a sterile fashion, and a sterile drape was applied covering the operative  field. Maximum barrier sterile technique with sterile gowns and gloves were used for the procedure. A timeout was performed prior to the initiation of the procedure. Local anesthesia was provided with 1% lidocaine. Under direct ultrasound guidance, the right brachial vein was accessed with a micropuncture kit after the overlying soft tissues were anesthetized with 1% lidocaine. An ultrasound image was saved for documentation purposes. A guidewire was advanced to the level of the superior caval-atrial junction for measurement purposes and the PICC line was cut to length. A peel-away sheath was placed and a 45 cm, 5 [place], dual lumen was inserted to level of the superior caval-atrial junction. A post procedure spot fluoroscopic was obtained. The catheter easily aspirated and flushed and was sutured in place. A dressing was placed. The patient tolerated the procedure well without immediate post procedural complication. FINDINGS: After catheter placement, the tip lies within the superior cavoatrial junction. The catheter aspirates and flushes normally and is ready for immediate use. IMPRESSION: Successful ultrasound and fluoroscopic guided placement of a right brachial vein approach, 45 cm, 5 French, dual lumen PICC with tip at the superior caval-atrial junction. The PICC line is ready for immediate use. Electronically Signed   By: Jerilynn Mages.  Shick M.D.   On: 05/17/2019 16:35   [place] Ekg Site Rite  Result Date: 05/14/2019 If Site Rite image not attached, placement could not be confirmed due to current cardiac rhythm.  Time Spent in minutes  30     Desiree Hane M.D on 05/21/2019 at 4:07 PM  To page go to [email or web address] - password [facility]

## 2019-05-21 NOTE — Progress Notes (Signed)
  NEUROSURGERY PROGRESS NOTE   No issues overnight. Pt remains very somnolent, answers simple questions.  EXAM:  BP 121/60 (BP Location: Left Arm)   Pulse (!) 55   Temp (!) 97.5 F (36.4 C) (Oral)   Resp 16   Ht 5\' 9"  (1.753 m)   Wt 121.1 kg   SpO2 100%   BMI 39.43 kg/m   Somnolent, arouses to voice Answers simple questions, falls back asleep Moves all extremities  IMPRESSION:  69 y.o. 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 TEE negative for endocarditis. If medically stable, could now proceed with operative stabilization.  PLAN: - Will plan on OR next week Tuesday (assuming he remains stable) for T11-L4 instrumented stabilization and fusion
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
## 2019-05-22 ENCOUNTER — Inpatient Hospital Stay (HOSPITAL_COMMUNITY): Payer: Medicare Other

## 2019-05-22 DIAGNOSIS — I482 Chronic atrial fibrillation, unspecified: Secondary | ICD-10-CM | POA: Diagnosis not present

## 2019-05-22 DIAGNOSIS — J9611 Chronic respiratory failure with hypoxia: Secondary | ICD-10-CM | POA: Diagnosis not present

## 2019-05-22 DIAGNOSIS — R1313 Dysphagia, pharyngeal phase: Secondary | ICD-10-CM

## 2019-05-22 DIAGNOSIS — R7881 Bacteremia: Secondary | ICD-10-CM | POA: Diagnosis not present

## 2019-05-22 DIAGNOSIS — I5022 Chronic systolic (congestive) heart failure: Secondary | ICD-10-CM | POA: Diagnosis not present

## 2019-05-22 LAB — BASIC METABOLIC PANEL
Anion gap: 10 (ref 5–15)
BUN: 18 mg/dL (ref 8–23)
CO2: 26 mmol/L (ref 22–32)
Calcium: 7.8 mg/dL — ABNORMAL LOW (ref 8.9–10.3)
Chloride: 91 mmol/L — ABNORMAL LOW (ref 98–111)
Creatinine, Ser: 0.75 mg/dL (ref 0.61–1.24)
GFR calc Af Amer: >60 mL/min (ref >60)
GFR calc non Af Amer: >60 mL/min (ref >60)
Glucose, Bld: 199 mg/dL — ABNORMAL HIGH (ref 70–99)
Potassium: 4.8 mmol/L (ref 3.5–5.1)
Sodium: 127 mmol/L — ABNORMAL LOW (ref 135–145)

## 2019-05-22 LAB — CBC
HCT: 28.8 % — ABNORMAL LOW (ref 39.0–52.0)
Hemoglobin: 9.5 g/dL — ABNORMAL LOW (ref 13.0–17.0)
MCH: 28.4 pg (ref 26.0–34.0)
MCHC: 33 g/dL (ref 30.0–36.0)
MCV: 86.2 fL (ref 80.0–100.0)
Platelets: 205 10*3/uL (ref 150–400)
RBC: 3.34 MIL/uL — ABNORMAL LOW (ref 4.22–5.81)
RDW: 15.8 % — ABNORMAL HIGH (ref 11.5–15.5)
WBC: 9.6 10*3/uL (ref 4.0–10.5)
nRBC: 0 % (ref 0.0–0.2)

## 2019-05-22 LAB — GLUCOSE, CAPILLARY
Glucose-Capillary: 168 mg/dL — ABNORMAL HIGH (ref 70–99)
Glucose-Capillary: 183 mg/dL — ABNORMAL HIGH (ref 70–99)
Glucose-Capillary: 207 mg/dL — ABNORMAL HIGH (ref 70–99)
Glucose-Capillary: 224 mg/dL — ABNORMAL HIGH (ref 70–99)
Glucose-Capillary: 244 mg/dL — ABNORMAL HIGH (ref 70–99)

## 2019-05-22 LAB — HEPARIN LEVEL (UNFRACTIONATED): Heparin Unfractionated: 0.36 IU/mL (ref 0.30–0.70)

## 2019-05-22 MED ORDER — FUROSEMIDE 10 MG/ML IJ SOLN
40.0000 mg | Freq: Once | INTRAMUSCULAR | Status: AC
  Administered 2019-05-22: 40 mg via INTRAVENOUS
  Filled 2019-05-22: qty 4

## 2019-05-22 MED ORDER — OXYCODONE HCL 5 MG PO TABS
5.0000 mg | ORAL_TABLET | ORAL | Status: DC | PRN
  Administered 2019-05-22 – 2019-05-26 (×5): 5 mg
  Filled 2019-05-22 (×4): qty 1

## 2019-05-22 MED ORDER — POLYETHYLENE GLYCOL 3350 17 G PO PACK
17.0000 g | PACK | Freq: Every day | ORAL | Status: DC
  Administered 2019-05-23 – 2019-05-29 (×6): 17 g
  Filled 2019-05-22 (×7): qty 1

## 2019-05-22 MED ORDER — PANTOPRAZOLE SODIUM 40 MG PO PACK
40.0000 mg | PACK | Freq: Every day | ORAL | Status: DC
  Administered 2019-05-22 – 2019-06-17 (×26): 40 mg
  Filled 2019-05-22 (×26): qty 20

## 2019-05-22 NOTE — Progress Notes (Signed)
Pt off unit at this time with transport.

## 2019-05-22 NOTE — Progress Notes (Signed)
ANTICOAGULATION CONSULT NOTE  Pharmacy Consult for Heparin Indication: atrial fibrillation   Patient Measurements: Height: 5\' 9"  (175.3 cm) Weight: 245 lb 9.5 oz (111.4 kg) IBW/kg (Calculated) : 70.7 Heparin Dosing Weight: 98 kg  Vital Signs: Temp: 98.4 F (36.9 C) (11/13 2159) Temp Source: Oral (11/13 2159) BP: 136/59 (11/13 2159) Pulse Rate: 62 (11/13 2159)  Labs: Recent Labs    05/20/19 0406  05/20/19 1947 05/21/19 0340 05/22/19 0415  HGB 9.4*  --   --  9.4* 9.5*  HCT 29.6*  --   --  28.2* 28.8*  PLT 184  --   --  200 205  HEPARINUNFRC 0.24*   < > 0.36 0.45 0.36  CREATININE 0.79  --   --  0.66 0.75   < > = values in this interval not displayed.     Assessment: 68 year old male on Xarelto prior to admission for atrial fibrillation who presented post fall and was transitioned to Heparin IV due to need for possible lumbar surgery.   Heparin level continues to be therapeutic at 0.36 on current rate of 2350 units/hr.  Hemoglobin remains stable. Platelets are trending up at 205 and are within normal limits.  No bleeding reported.  Goal of Therapy:  Heparin level 0.3-0.7 units/ml Monitor platelets by anticoagulation protocol: Yes  Plan:  Continue heparin drip at 2350 units/hr. Continue to monitor daily heparin level and CBC while on therapy.  Follow-up surgical plans.   Berenice Bouton, PharmD PGY1 Pharmacy Resident Office phone: [phone] Phone until 3:30 pm: [phone] 05/22/2019, 7:34 AM

## 2019-05-22 NOTE — Progress Notes (Addendum)
[facility]  PROGRESS NOTE  Cody Hale AVW:[id number] DOB: [birth year] DOA: 05/06/2019 PCP: [name], MD Admit date - 05/06/2019   Admitting Physician Alma Friendly, MD  Outpatient Primary MD for the patient is [name], MD  LOS - 49 Brief Narrative   Cody Hale is a 68 y.o. 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  Hospital course complicated by [id number] infection positive with no symptoms though he did complete 5 days of remdesivir and full course of heparin drip GBS bacteremia IV penicillin, currently awaiting TEE results  Subjective  Cody Hale today has wet sounding cough otherwise has no complaints. Tolerating the NG tube A & P   1. Unstable L1-L2 fracture, stable. 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  Neurosurgery plans for operative stabilization pending stable clinically possibly next Tuesday after completion of IV penicillin on 11/16  2. GBS bacteremia with history of mitral prosthetic valve, stable. On penicillin till 11/16 given no vegetations on TEE per ID recs. Remains afebrile, white count trending down and no recurrent bacteremia.   3. COVID 19 pneumonia seems resolved. Repeat CXR shows resolution of consolidation/opacities. Ferritin wnl, CRP elevated but has concern for ankylosing spondylitis. CXR more consistent with likely pulm edema.  Exam continue vitamin C and zinc,   4. Hyponatremia with normal serum osm ( 281), worsened.  Sodium now 127 and decrease in urinary output likely related to hypervolemia from CHF Restart IV lasix  trial and monitor BMP ( nadir of 127 ),  Continue to monitor BMP.  5. Hyperkalemia, intermittent. Previously responded to lokelma, K wnl today, monitor BMP.  6. Pharyngeal dysphagia and possible aspiration per Speech eval NPO, cortrak placed on 11/13, feeding tube via cortrak. CXR shows no pna   7. CHF with reduced EF (EF 40-45%, TTE 05/20/19), mild exacerbation, appears compensated but CXR shows interval development of mild interstitial pulmonary edema, urinary output decreased and hyponatremia worsening. Will switch back to IV lasix and closely monitor daily weights, strict I/o, bmp.   continue fluid restriction, continue Coreg  8. Chronic atrial fibrillation, rate controlled.  Holding home Xarelto, continue heparin drip given will likely need neurosurgical intervention once infection status stable.  9. Type 2 diabetes, continue Lantus 45 units CBGs at goal (reduced from home regimen of Tresiba 50 units daily), NovoLog scheduled 8 units 3 times daily, sliding scale as needed.  Holding home [place]  10. Thrombocytopenia, resolved.   no active signs of bleeding, monitor CBC.  11. GERD, stable.  Continue ProtonixDiminished appetite.  Continue Ensure twice daily  12. COPD, stable.  Continue Incruse.     Family Communication  : Spoke with son Cody Hale at [phone] on 05/22/19, will update  Code Status :  FULL  Disposition Plan  : Continue inpatient stay, IV penicillin until 11/16, then plan for or intervention for fx's by neurosurgery  Consults  : ID, neurosurgery, cardiology Procedures  : 10/31 venous duplex TEE, 05/20/19 pending results  DVT Prophylaxis  : IV Heparin  Lab Results  Component Value Date   PLT 205 05/22/2019    Diet :  Diet Order  Diet NPO time specified  Diet effective now               Inpatient Medications Scheduled Meds:  ascorbic acid  500 mg Per Tube Daily   carvedilol  3.125 mg Per Tube BID   Chlorhexidine Gluconate Cloth  6  each Topical Daily   docusate  100 mg Per Tube Daily   feeding supplement (PRO-STAT SUGAR FREE 64)  30 mL Per Tube TID   fluticasone  2 spray Each Nare Daily   fluticasone furoate-vilanterol  1 puff Inhalation Daily   insulin aspart  0-15 Units Subcutaneous Q4H   insulin aspart  8 Units Subcutaneous TID WC   insulin glargine  45 Units Subcutaneous Daily   loratadine  10 mg Per Tube QPM   multivitamin with minerals  1 tablet Per Tube Daily   pantoprazole sodium  40 mg Per Tube Daily   [START ON 05/23/2019] polyethylene glycol  17 g Per Tube Daily   senna-docusate  2 tablet Per Tube BID   sodium zirconium cyclosilicate  5 g Oral Once   torsemide  20 mg Per Tube q1800   umeclidinium bromide  1 puff Inhalation Daily   zinc sulfate  220 mg Per Tube Daily   Continuous Infusions:  feeding supplement (JEVITY 1.2 CAL) 55 mL/hr at 05/22/19 1200   heparin 2,350 Units/hr (05/22/19 0311)   penicillin g continuous IV infusion 12 Million Units (05/22/19 0944)   PRN Meds:.acetaminophen, [DISCONTINUED] acetaminophen **OR** acetaminophen, chlorpheniramine-HYDROcodone, guaiFENesin-dextromethorphan, hydrALAZINE, lidocaine (PF), metoprolol tartrate, morphine injection, nitroGLYCERIN, ondansetron **OR** ondansetron (ZOFRAN) IV, oxyCODONE  Antibiotics  :   Anti-infectives (From admission, onward)   Start     Dose/Rate Route Frequency Ordered Stop   05/15/19 1430  penicillin G potassium 12 Million Units in dextrose 5 % 500 mL continuous infusion     12 Million Units 41.7 mL/hr over 12 Hours Intravenous Every 12 hours 05/15/19 1343 05/24/19 2359   05/13/19 1000  penicillin G potassium 12 Million Units in dextrose 5 % 500 mL continuous infusion  Status:  Discontinued     12 Million Units 41.7 mL/hr over 12 Hours Intravenous Every 12 hours 05/12/19 1159 05/15/19 1343   05/11/19 0730  cefTRIAXone (ROCEPHIN) 2 g in sodium chloride 0.9 % 100 mL IVPB  Status:  Discontinued     2 g 200 mL/hr  over 30 Minutes Intravenous Every 24 hours 05/11/19 0721 05/12/19 1159   05/08/19 0200  remdesivir 100 mg in sodium chloride 0.9 % 250 mL IVPB     100 mg 500 mL/hr over 30 Minutes Intravenous Every 24 hours 05/07/19 0059 05/11/19 0513   05/07/19 0100  remdesivir 200 mg in sodium chloride 0.9 % 250 mL IVPB     200 mg 500 mL/hr over 30 Minutes Intravenous Once 05/07/19 0057 05/07/19 0512       Objective   Vitals:   05/21/19 1715 05/21/19 2159 05/22/19 0300 05/22/19 0753  BP: (!) 144/53 (!) 136/59  (!) 118/57  Pulse: 60 62  90  Resp: 16 20    Temp: 98 F (36.7 C) 98.4 F (36.9 C)  99.4 F (37.4 C)  TempSrc: Oral Oral  Axillary  SpO2: 97% 100%  97%  Weight:   111.4 kg   Height:        SpO2: 97 % O2 Flow Rate (L/min): 3 L/min FiO2 (%): 32 %  Wt Readings from Last 3 Encounters:  05/22/19 111.4 kg  03/24/19 120.7 kg  02/16/19 117.9  kg     Intake/Output Summary (Last 24 hours) at 05/22/2019 1300 Last data filed at 05/22/2019 1200 Gross per 24 hour  Intake 2194.14 ml  Output 926 ml  Net 1268.14 ml    Physical Exam:  Oriented to self, place, context, sleepy but easilyarousable No new F.N deficits, following commands and moving extremities appropriately No JVD appreciated Symmetrical Chest wall movement, Good air movement bilaterally, no respiratory distress on 3 L RRR,No Gallops,Rubs or new Murmurs,  +ve B.Sounds, Abd Soft, No tenderness, No rebound, guarding or rigidity. No Cyanosis, Clubbing or edema,  No new Rash or bruise     I have personally reviewed the following:   Data Reviewed:  CBC Recent Labs  Lab 05/18/19 0500 05/19/19 0942 05/20/19 0406 05/21/19 0340 05/22/19 0415  WBC 20.8* 13.5* 12.4* 9.6 9.6  HGB 11.3* 10.5* 9.4* 9.4* 9.5*  HCT 35.4* 32.0* 29.6* 28.2* 28.8*  PLT 123* 159 184 200 205  MCV 86.8 86.0 85.8 85.5 86.2  MCH 27.7 28.2 27.2 28.5 28.4  MCHC 31.9 32.8 31.8 33.3 33.0  RDW 15.3 15.4 15.5 15.6* 15.8*    Chemistries  Recent  Labs  Lab 05/17/19 0521 05/19/19 0942 05/19/19 1516 05/20/19 0406 05/21/19 0340 05/22/19 0415  NA 129* 127* 128* 130* 130* 127*  K 4.6 5.3* 4.9 5.4* 4.8 4.8  CL 96* 93* 94* 96* 94* 91*  CO2 25 27 26 27 27 26   GLUCOSE 194* 207* 182* 152* 130* 199*  BUN 14 20 19 16 12 18   CREATININE 0.79 0.91 0.88 0.79 0.66 0.75  CALCIUM 8.1* 7.9* 7.7* 7.9* 7.9* 7.8*  AST 20 41  --   --   --   --   ALT 16 19  --   --   --   --   ALKPHOS 62 65  --   --   --   --   BILITOT 1.1 1.2  --   --   --   --    ------------------------------------------------------------------------------------------------------------------ Recent Labs    05/20/19 0406  LDLDIRECT 41.1    Lab Results  Component Value Date   HGBA1C 7.4 (H) 03/24/2019   ------------------------------------------------------------------------------------------------------------------ No results for input(s): TSH, T4TOTAL, T3FREE, THYROIDAB in the last 72 hours.  Invalid input(s): FREET3 ------------------------------------------------------------------------------------------------------------------ No results for input(s): VITAMINB12, FOLATE, FERRITIN, TIBC, IRON, RETICCTPCT in the last 72 hours.  Coagulation profile No results for input(s): INR, PROTIME in the last 168 hours.  No results for input(s): DDIMER in the last 72 hours.  Cardiac Enzymes No results for input(s): CKMB, TROPONINI, MYOGLOBIN in the last 168 hours.  Invalid input(s): CK ------------------------------------------------------------------------------------------------------------------    Component Value Date/Time   BNP 326.0 (H) 05/06/2019 1552    Micro Results No results found for this or any previous visit (from the past 240 hour(s)).  Radiology Reports Dg Pelvis 1-2 Views  Result Date: 05/06/2019 CLINICAL DATA:  Low back pain after fall. EXAM: PELVIS - 1-2 VIEW COMPARISON:  Pelvic and right hip x-rays dated September 08, 2018. FINDINGS: There is no  evidence of pelvic fracture or diastasis. Prior right total hip arthroplasty. Unchanged moderate left hip osteoarthritis. IMPRESSION: No acute osseous abnormality. Electronically Signed   By: Titus Dubin M.D.   On: 05/06/2019 19:24   Dg Forearm Right  Result Date: 05/06/2019 CLINICAL DATA:  Right forearm pain after fall. EXAM: RIGHT FOREARM - 2 VIEW COMPARISON:  Right wrist and elbow x-rays dated May 16, 2018. FINDINGS: There is no evidence of fracture or other focal  bone lesions. Soft tissues are unremarkable. IMPRESSION: Negative. Electronically Signed   By: Titus Dubin M.D.   On: 05/06/2019 19:13   Dg Abd 1 View  Result Date: 05/15/2019 CLINICAL DATA:  Evaluate for constipation. EXAM: ABDOMEN - 1 VIEW COMPARISON:  CT abdomen pelvis 04/11/2018. FINDINGS: Single AP supine radiograph of the abdomen through the pubic symphysis. The lung bases are excluded from field of view. Gastric distention. There are no dilated loops of bowel to suggest obstruction. Mild stool burden. No supine evidence for free air. Status post right hip arthroplasty, incompletely visualized. IMPRESSION: Gastric distention. Nonobstructive bowel gas pattern. Mild stool burden. Electronically Signed   By: Audie Pinto M.D.   On: 05/15/2019 18:25   Ct Lumbar Spine Wo Contrast  Result Date: 05/22/2019 CLINICAL DATA:  Preop for spine surgery. Mazor protocol. History of fracture through the L1-2 disc space. EXAM: CT LUMBAR SPINE WITHOUT CONTRAST TECHNIQUE: Multidetector CT imaging of the lumbar spine was performed without intravenous contrast administration. Multiplanar CT image reconstructions were also generated. COMPARISON:  CT lumbar spine 05/11/2019 FINDINGS: Segmentation: There are five lumbar type vertebral bodies. The last full intervertebral disc space is labeled L5-S1. This correlates with the prior studies. Alignment: Normal in stable. Vertebrae: Stable age advanced osteoporosis and severe multilevel facet  disease. Stable widening anteriorly at the L1-2 disc space. I do not see any definite disruption of the facets at this level. No new/acute bony findings. Paraspinal and other soft tissues: No significant findings. Disc levels: No interval change since the recent CT scan from 10 days ago. There is multilevel disc disease and severe facet disease with large bridging fusing osteophytes likely changes of DISH. Ankylosing spondylitis is another possibility. Stable wide decompressive laminectomy at L5-S1. IMPRESSION: Stable appearance of the lumbar spine when compared to 2 prior studies from 05/06/2019 and 05/11/2019. Fracture through the L1-2 disc space with anterior widening but no obvious posterior element fractures. Stable underlying changes of ankylosing spondylitis or DISH. Electronically Signed   By: Marijo Sanes M.D.   On: 05/22/2019 11:48   Ct Lumbar Spine Wo Contrast  Result Date: 05/11/2019 CLINICAL DATA:  Lumbar fracture.  Surgical planning. EXAM: CT LUMBAR SPINE WITHOUT CONTRAST TECHNIQUE: Multidetector CT imaging of the lumbar spine was performed without intravenous contrast administration. Multiplanar CT image reconstructions were also generated. COMPARISON:  05/06/2019 FINDINGS: The study is mildly motion degraded despite repeat imaging. Segmentation: 5 lumbar type vertebrae. Alignment: Trace retrolisthesis of L5 on S1. Vertebrae: Diffuse ankylosis is again noted throughout the lumbar and included lower thoracic spine as well as across both SI joints. A transverse fracture is again seen through the L1-2 disc space including across the bridging osteophytes/syndesmophytes. Posterior element extension was better demonstrated on the prior study due to motion and image noise on today's examination. Anterior widening of the L1-2 disc space is unchanged. No new fracture is identified. Paraspinal and other soft tissues: Small pleural effusions and dependent atelectasis in the lung bases. Aortic  atherosclerosis. Partially visualized right hip arthroplasty. Disc levels: Disc space narrowing greatest at L5-S1 where endplate and facet spurring result in severe bilateral neural foraminal stenosis. IMPRESSION: 1. Lumbar spine CT for surgical planning. 2. Findings suggestive of ankylosing spondylitis with fracture through the L1-2 disc space as previously described. Unchanged anterior disc space widening. 3.  Aortic Atherosclerosis (ICD10-I70.0). Electronically Signed   By: Logan Bores M.D.   On: 05/11/2019 14:04   Ct Lumbar Spine Wo Contrast  Result Date: 05/06/2019 CLINICAL DATA:  Tender to  palpation over the lower lumbar spine, fall EXAM: CT LUMBAR SPINE WITHOUT CONTRAST TECHNIQUE: Multidetector CT imaging of the lumbar spine was performed without intravenous contrast administration. Multiplanar CT image reconstructions were also generated. COMPARISON:  CT April 11, 2018 FINDINGS: Segmentation: There are 5 non-rib bearing lumbar type vertebral bodies with the last intervertebral disc space labeled as L5-S1. Alignment: Normal Vertebrae: There is a fracture seen through the anterior bridging osteophyte of L1 with widening of the anterior inter disc space which measures 1.6 cm. The fracture extends through the right posterior bridging osteophyte and pedicle as well as through the left-sided pedicle and superior articulating facet. There is also a minimally displaced fracture seen at the right-sided inferior corner of the L1 vertebral body. The fracture also extends through the L1 spinous process. There is minimal right-sided facet joint widening seen at L1-L2. No jumped facets however are noted. A small amount of air seen within the inter disc space at L1-L2. No other definite fracture is identified. There are bridging anterior osteophytes seen through the remainder of the lumbar spine. There inter disc calcifications. There is findings of of diffuse enthesopathy seen between the spinous processes in the  lumbar spine. Paraspinal and other soft tissues: Mild prevertebral soft tissue swelling is seen at L1-L2. No large hematoma however is noted. The sacroiliac joints are intact. There is diffuse ankylosis seen around the bilateral sacroiliac joints. Scattered aortic atherosclerosis is noted. Disc levels: Degenerative changes are seen throughout the lumbar spine with disc height loss and facet arthropathy which is most notable at L5-S1 with severe bilateral neural foraminal narrowing. IMPRESSION: 1. Fracture through the anterior bridging osteophyte of L1-2 with widening of the inter disc space . The fracture extends through the posterior columns at L1, as described above through the pedicles, facet, and spinous process. 2. Mild prevertebral soft tissue swelling is seen at this level. 3. Findings suggestive of ankylosing spondylitis. 4.  Aortic Atherosclerosis (ICD10-I70.0). These results were called by telephone at the time of interpretation on 05/06/2019 at 4:56 pm to provider [facility] , who verbally acknowledged these results. Electronically Signed   By: Prudencio Pair M.D.   On: 05/06/2019 17:02   Dg Chest Port 1 View  Result Date: 05/21/2019 CLINICAL DATA:  Dyspnea and aspiration EXAM: PORTABLE CHEST 1 VIEW COMPARISON:  May 18, 2019 FINDINGS: The mediastinal contour and cardiac silhouette are stable. A feeding tube is identified with distal tip not included on film. Right central venous line is identified with distal tip in the superior vena cava. The heart size is enlarged. Increased pulmonary interstitium is identified bilaterally. There is no focal pneumonia or pleural effusion. The bony structures are stable. IMPRESSION: Cardiomegaly with mild interstitial edema. Electronically Signed   By: Abelardo Diesel M.D.   On: 05/21/2019 12:31   Dg Chest Port 1 View  Result Date: 05/18/2019 CLINICAL DATA:  Pneumonia. EXAM: PORTABLE CHEST 1 VIEW COMPARISON:  May 15, 2019. FINDINGS: Stable  cardiomegaly. No pneumothorax or pleural effusion is noted. Right-sided PICC line is noted with distal tip in expected position of cavoatrial junction. No acute pulmonary disease is noted. Bony thorax is unremarkable. IMPRESSION: Right-sided PICC line is been repositioned with tip in expected position of cavoatrial junction. No acute cardiopulmonary abnormality seen. Electronically Signed   By: Marijo Conception M.D.   On: 05/18/2019 10:52   Dg Chest Port 1 View  Result Date: 05/15/2019 CLINICAL DATA:  68 year old male with a history of PICC placement EXAM: PORTABLE CHEST 1  VIEW COMPARISON:  None. FINDINGS: Cardiomediastinal silhouette unchanged in size and contour with surgical changes of median sternotomy and cardiomegaly. No pneumothorax. No pleural effusion. Coarsened interstitial markings throughout. Right upper extremity PICC is redundant, without visualization of the point of termination of the tip. Redemonstration of partially healed right-sided rib fractures IMPRESSION: Right upper extremity PICC is redundant within the veins in the tip is not visualized. Repositioning is indicated. Similar appearance of the lungs with diffuse reticulonodular opacities. Similar appearance of surgical changes of median sternotomy and cardiomegaly. Electronically Signed   By: Corrie Mckusick D.O.   On: 05/15/2019 11:13   Dg Chest Port 1 View  Result Date: 05/10/2019 CLINICAL DATA:  68 year old male positive for COVID-19. Fever. EXAM: PORTABLE CHEST 1 VIEW COMPARISON:  Portable chest 05/06/2019 and earlier. FINDINGS: Portable AP semi upright view at 1645 hours. Chronic cardiomegaly. Mildly lower lung volumes compared to October. New bilateral basilar predominant confluent and indistinct pulmonary opacity. No superimposed pneumothorax or pleural effusion. Prior sternotomy. No acute osseous abnormality identified. IMPRESSION: 1. New from 05/06/2019 bilateral basilar predominant bilateral pulmonary opacity compatible with  COVID-19 pneumonia. 2. Chronic cardiomegaly. Electronically Signed   By: Genevie Ann M.D.   On: 05/10/2019 17:04   Dg Chest Portable 1 View  Result Date: 05/06/2019 CLINICAL DATA:  Fall. EXAM: PORTABLE CHEST 1 VIEW COMPARISON:  Chest x-ray dated January 13, 2019. FINDINGS: Stable cardiomegaly. Pulmonary vascular congestion. No focal consolidation, pleural effusion, or pneumothorax. No acute osseous abnormality. Old right-sided rib fractures again noted. IMPRESSION: Cardiomegaly with pulmonary vascular congestion. Electronically Signed   By: Titus Dubin M.D.   On: 05/06/2019 19:11   Vas [place] Lower Extremity Venous (dvt)  Result Date: 05/08/2019  Lower Venous Study Indications: Covid positive, elevated D-Dimer. Has unstable lumbar fracture.  Limitations: Patient in severe pain with compression and body habitus. Comparison Study: No prior study on file for comparison Performing Technologist: Sharion Dove RVS  Examination Guidelines: A complete evaluation includes B-mode imaging, spectral Doppler, color Doppler, and power Doppler as needed of all accessible portions of each vessel. Bilateral testing is considered an integral part of a complete examination. Limited examinations for reoccurring indications may be performed as noted.  +---------+---------------+---------+-----------+----------+--------------+  RIGHT     Compressibility Phasicity Spontaneity Properties Thrombus Aging  +---------+---------------+---------+-----------+----------+--------------+  CFV       Full            Yes       Yes                                    +---------+---------------+---------+-----------+----------+--------------+  SFJ       Full                                                             +---------+---------------+---------+-----------+----------+--------------+  FV Prox   Full                                                             +---------+---------------+---------+-----------+----------+--------------+  FV Mid  Full                                                             +---------+---------------+---------+-----------+----------+--------------+  FV Distal Full                                                             +---------+---------------+---------+-----------+----------+--------------+  PFV       Full                                                             +---------+---------------+---------+-----------+----------+--------------+  POP       Full            Yes       Yes                                    +---------+---------------+---------+-----------+----------+--------------+  PTV       Full                                                             +---------+---------------+---------+-----------+----------+--------------+  PERO      Full                                                             +---------+---------------+---------+-----------+----------+--------------+   +---------+---------------+---------+-----------+----------+--------------+  LEFT      Compressibility Phasicity Spontaneity Properties Thrombus Aging  +---------+---------------+---------+-----------+----------+--------------+  CFV       Full            Yes       Yes                                    +---------+---------------+---------+-----------+----------+--------------+  SFJ       Full                                                             +---------+---------------+---------+-----------+----------+--------------+  FV Prox   Full                                                             +---------+---------------+---------+-----------+----------+--------------+  FV Mid    Full                                                             +---------+---------------+---------+-----------+----------+--------------+  FV Distal Full                                                             +---------+---------------+---------+-----------+----------+--------------+  PFV       Full                                                              +---------+---------------+---------+-----------+----------+--------------+  POP       Full            Yes       Yes                                    +---------+---------------+---------+-----------+----------+--------------+  PTV       Full                                                             +---------+---------------+---------+-----------+----------+--------------+  PERO      Full                                                             +---------+---------------+---------+-----------+----------+--------------+     Summary: Right: There is no evidence of deep vein thrombosis in the lower extremity. Left: There is no evidence of deep vein thrombosis in the lower extremity.  *See table(s) above for measurements and observations. Electronically signed by Deitra Mayo MD on 05/08/2019 at 5:56:44 PM.    Final    Ir Picc Placement Right >5 Yrs Inc Img Guide  Result Date: 05/17/2019 INDICATION: BACTEREMIA, ACCESS FOR LONG-TERM ANTIBIOTICS EXAM: ULTRASOUND AND FLUOROSCOPIC GUIDED PICC LINE INSERTION MEDICATIONS: 1% LIDOCAINE LOCAL CONTRAST:  None FLUOROSCOPY TIME:  120 seconds (12 mGy) COMPLICATIONS: None immediate. TECHNIQUE: The procedure, risks, benefits, and alternatives were explained to the patient's family and informed written consent was obtained. A timeout was performed prior to the initiation of the procedure. The right upper extremity was prepped with chlorhexidine in a sterile fashion, and a sterile drape was applied covering the operative field. Maximum barrier sterile technique with sterile gowns and gloves were used for the procedure. A timeout was performed prior to the initiation of the procedure. Local anesthesia was provided with 1% lidocaine. Under direct ultrasound guidance, the right brachial vein was  accessed with a micropuncture kit after the overlying soft tissues were anesthetized with 1% lidocaine. An ultrasound image was saved for documentation  purposes. A guidewire was advanced to the level of the superior caval-atrial junction for measurement purposes and the PICC line was cut to length. A peel-away sheath was placed and a 45 cm, 5 [place], dual lumen was inserted to level of the superior caval-atrial junction. A post procedure spot fluoroscopic was obtained. The catheter easily aspirated and flushed and was sutured in place. A dressing was placed. The patient tolerated the procedure well without immediate post procedural complication. FINDINGS: After catheter placement, the tip lies within the superior cavoatrial junction. The catheter aspirates and flushes normally and is ready for immediate use. IMPRESSION: Successful ultrasound and fluoroscopic guided placement of a right brachial vein approach, 45 cm, 5 [place], dual lumen PICC with tip at the superior caval-atrial junction. The PICC line is ready for immediate use. Electronically Signed   By: Jerilynn Mages.  Shick M.D.   On: 05/17/2019 16:35   [place] Ekg Site Rite  Result Date: 05/14/2019 If Site Rite image not attached, placement could not be confirmed due to current cardiac rhythm.    Time Spent in minutes  30     Desiree Hane M.D on 05/22/2019 at 1:00 PM  To page go to [email or web address] - password [facility]

## 2019-05-22 NOTE — Progress Notes (Signed)
Pt returned to unit.

## 2019-05-23 DIAGNOSIS — R0600 Dyspnea, unspecified: Secondary | ICD-10-CM

## 2019-05-23 DIAGNOSIS — J9611 Chronic respiratory failure with hypoxia: Secondary | ICD-10-CM | POA: Diagnosis not present

## 2019-05-23 DIAGNOSIS — R7881 Bacteremia: Secondary | ICD-10-CM | POA: Diagnosis not present

## 2019-05-23 DIAGNOSIS — E871 Hypo-osmolality and hyponatremia: Secondary | ICD-10-CM

## 2019-05-23 DIAGNOSIS — I5022 Chronic systolic (congestive) heart failure: Secondary | ICD-10-CM | POA: Diagnosis not present

## 2019-05-23 DIAGNOSIS — I482 Chronic atrial fibrillation, unspecified: Secondary | ICD-10-CM | POA: Diagnosis not present

## 2019-05-23 DIAGNOSIS — R0689 Other abnormalities of breathing: Secondary | ICD-10-CM

## 2019-05-23 LAB — BASIC METABOLIC PANEL
Anion gap: 11 (ref 5–15)
BUN: 20 mg/dL (ref 8–23)
CO2: 27 mmol/L (ref 22–32)
Calcium: 7.9 mg/dL — ABNORMAL LOW (ref 8.9–10.3)
Chloride: 89 mmol/L — ABNORMAL LOW (ref 98–111)
Creatinine, Ser: 0.79 mg/dL (ref 0.61–1.24)
GFR calc Af Amer: >60 mL/min (ref >60)
GFR calc non Af Amer: >60 mL/min (ref >60)
Glucose, Bld: 300 mg/dL — ABNORMAL HIGH (ref 70–99)
Potassium: 5.1 mmol/L (ref 3.5–5.1)
Sodium: 127 mmol/L — ABNORMAL LOW (ref 135–145)

## 2019-05-23 LAB — CBC
HCT: 28.9 % — ABNORMAL LOW (ref 39.0–52.0)
Hemoglobin: 9.2 g/dL — ABNORMAL LOW (ref 13.0–17.0)
MCH: 27.9 pg (ref 26.0–34.0)
MCHC: 31.8 g/dL (ref 30.0–36.0)
MCV: 87.6 fL (ref 80.0–100.0)
Platelets: 207 10*3/uL (ref 150–400)
RBC: 3.3 MIL/uL — ABNORMAL LOW (ref 4.22–5.81)
RDW: 15.9 % — ABNORMAL HIGH (ref 11.5–15.5)
WBC: 9.5 10*3/uL (ref 4.0–10.5)
nRBC: 0 % (ref 0.0–0.2)

## 2019-05-23 LAB — GLUCOSE, CAPILLARY
Glucose-Capillary: 194 mg/dL — ABNORMAL HIGH (ref 70–99)
Glucose-Capillary: 217 mg/dL — ABNORMAL HIGH (ref 70–99)
Glucose-Capillary: 220 mg/dL — ABNORMAL HIGH (ref 70–99)
Glucose-Capillary: 242 mg/dL — ABNORMAL HIGH (ref 70–99)
Glucose-Capillary: 248 mg/dL — ABNORMAL HIGH (ref 70–99)
Glucose-Capillary: 276 mg/dL — ABNORMAL HIGH (ref 70–99)
Glucose-Capillary: 279 mg/dL — ABNORMAL HIGH (ref 70–99)

## 2019-05-23 LAB — HEPARIN LEVEL (UNFRACTIONATED): Heparin Unfractionated: 0.37 IU/mL (ref 0.30–0.70)

## 2019-05-23 LAB — OSMOLALITY: Osmolality: 278 mOsm/kg (ref 275–295)

## 2019-05-23 MED ORDER — INSULIN GLARGINE 100 UNIT/ML ~~LOC~~ SOLN
50.0000 [IU] | Freq: Every day | SUBCUTANEOUS | Status: DC
  Administered 2019-05-24: 50 [IU] via SUBCUTANEOUS
  Filled 2019-05-23: qty 0.5

## 2019-05-23 MED ORDER — FUROSEMIDE 10 MG/ML IJ SOLN
40.0000 mg | Freq: Once | INTRAMUSCULAR | Status: AC
  Administered 2019-05-23: 40 mg via INTRAVENOUS
  Filled 2019-05-23: qty 4

## 2019-05-23 MED ORDER — INSULIN ASPART 100 UNIT/ML ~~LOC~~ SOLN
0.0000 [IU] | SUBCUTANEOUS | Status: DC
  Administered 2019-05-23: 3 [IU] via SUBCUTANEOUS
  Administered 2019-05-23: 8 [IU] via SUBCUTANEOUS
  Administered 2019-05-23: 13 [IU] via SUBCUTANEOUS
  Administered 2019-05-24 (×2): 5 [IU] via SUBCUTANEOUS
  Administered 2019-05-24 (×5): 8 [IU] via SUBCUTANEOUS
  Administered 2019-05-25: 3 [IU] via SUBCUTANEOUS

## 2019-05-23 MED ORDER — INSULIN ASPART 100 UNIT/ML ~~LOC~~ SOLN
8.0000 [IU] | SUBCUTANEOUS | Status: DC
  Administered 2019-05-23 – 2019-05-24 (×8): 8 [IU] via SUBCUTANEOUS

## 2019-05-23 NOTE — Progress Notes (Signed)
ANTICOAGULATION CONSULT NOTE  Pharmacy Consult for Heparin Indication: atrial fibrillation   Patient Measurements: Height: 5\' 9"  (175.3 cm) Weight: 246 lb 0.5 oz (111.6 kg) IBW/kg (Calculated) : 70.7 Heparin Dosing Weight: 98 kg  Vital Signs: Temp: 98.4 F (36.9 C) (11/14 2219) Temp Source: Oral (11/14 2219) BP: 127/53 (11/14 2219) Pulse Rate: 88 (11/14 2219)  Labs: Recent Labs    05/21/19 0340 05/22/19 0415 05/23/19 0455  HGB 9.4* 9.5* 9.2*  HCT 28.2* 28.8* 28.9*  PLT 200 205 207  HEPARINUNFRC 0.45 0.36 0.37  CREATININE 0.66 0.75  --      Assessment: 68 year old male on Xarelto prior to admission for atrial fibrillation who presented post fall and was transitioned to Heparin IV due to need for possible lumbar surgery on 11/17.   Heparin level continues to be therapeutic at 0.37 on current rate of 2350 units/hr.  Hemoglobin remains stable. Platelets are trending up at 207 and are within normal limits.  No bleeding reported.  Goal of Therapy:  Heparin level 0.3-0.7 units/ml Monitor platelets by anticoagulation protocol: Yes  Plan:  Continue heparin drip at 2350 units/hr. Continue to monitor daily heparin level and CBC while on therapy.  Follow-up resuming Xarelto post-op  Berenice Bouton, PharmD PGY1 Pharmacy Resident Office phone: [phone] Phone until 3:30 pm: [phone] 05/23/2019, 7:55 AM

## 2019-05-23 NOTE — Progress Notes (Signed)
[facility]  PROGRESS NOTE  [name] PTW:[id number] DOB: [birth year] DOA: 05/06/2019 PCP: [name], MD Admit date - 05/06/2019   Admitting Physician Alma Friendly, MD  Outpatient Primary MD for the patient is [name], MD  LOS - 19 Brief Narrative   Cody Hale is a 68 y.o. 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  Hospital course complicated by [id number] infection positive with no symptoms though he did complete 5 days of remdesivir and full course of heparin drip GBS bacteremia IV penicillin, currently awaiting TEE results  Subjective  Mr.  [place] today seems to have wet sounding, no chest pain, no abdominal pain, no worsening shortness of breath. Tolerating the NG tube A & P   1. Unstable L1-L2 fracture, stable. 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  Neurosurgery plans for operative stabilization pending stable clinically possibly next Tuesday after completion of IV penicillin on 11/16  2. GBS bacteremia with history of mitral prosthetic valve, stable. On penicillin till 11/16 given no vegetations on TEE per ID recs. Remains afebrile, white count trending down and no recurrent bacteremia.   3. COVID 19 pneumonia seems resolved. Repeat CXR shows resolution of consolidation/opacities. Ferritin wnl, CRP elevated but has concern for ankylosing spondylitis. CXR more consistent with likely pulm edema.  Exam continue vitamin C and zinc,   4. Hyponatremia with normal serum osm ( 281), persists.  Sodium 127, good urinary output, continue IV lasix  diuresis. Serum osm isnormal. monitor BMP ( nadir of 127 ),    5. Hyperkalemia, intermittent.  Has responded well subtle,, potassium within normal limits today, monitor BMP.   6. Pharyngeal dysphagia and possible aspiration per Speech eval NPO, cortrak placed on 11/13, feeding tube via cortrak. CXR shows no pna   7. CHF with reduced EF (EF 40-45%, TTE 05/20/19), mild exacerbation, appears compensated but CXR shows interval development of mild interstitial pulmonary edema, continue IV lasix and closely monitor daily weights, strict I/o, bmp.   continue fluid restriction, continue Coreg  8. Chronic atrial fibrillation, rate controlled.  Holding home Xarelto, continue heparin drip given will likely need neurosurgical intervention once infection status stable.  9. Type 2 diabetes, with worsening hyperglycemia in the setting of continuous tube feeds, increase Lantus to 50 units, monitor CBG every 4H, add scheduled (every 4 hours) NovoLog 5 U with tube feeds, sliding scale as needed.    Holding home [place]  10. Thrombocytopenia, resolved.   no active signs of bleeding, monitor CBC.  11. GERD, stable.  Continue ProtonixDiminished appetite.  Continue Ensure twice daily  12. COPD, stable.  Continue Incruse.     Family Communication  : Spoke with son [name] at [phone] on 05/22/19, will update again today  Code Status :  FULL  Disposition Plan  : Continue inpatient stay, IV penicillin until 11/16, then plan for or intervention for fx's by neurosurgery  Consults  : ID, neurosurgery, cardiology Procedures  : 10/31 venous duplex TEE, 05/20/19 pending results  DVT Prophylaxis  : IV Heparin  Lab Results  Component Value Date   PLT 207 05/23/2019    Diet :  Diet  Order            Diet NPO time specified  Diet effective now               Inpatient Medications Scheduled Meds:  ascorbic acid  500 mg Per Tube Daily   carvedilol  3.125 mg Per Tube BID   Chlorhexidine  Gluconate Cloth  6 each Topical Daily   docusate  100 mg Per Tube Daily   feeding supplement (PRO-STAT SUGAR FREE 64)  30 mL Per Tube TID   fluticasone  2 spray Each Nare Daily   fluticasone furoate-vilanterol  1 puff Inhalation Daily   insulin aspart  0-15 Units Subcutaneous Q4H   insulin aspart  8 Units Subcutaneous Q4H   [START ON 05/24/2019] insulin glargine  50 Units Subcutaneous Daily   loratadine  10 mg Per Tube QPM   multivitamin with minerals  1 tablet Per Tube Daily   pantoprazole sodium  40 mg Per Tube Daily   polyethylene glycol  17 g Per Tube Daily   senna-docusate  2 tablet Per Tube BID   sodium zirconium cyclosilicate  5 g Oral Once   umeclidinium bromide  1 puff Inhalation Daily   zinc sulfate  220 mg Per Tube Daily   Continuous Infusions:  feeding supplement (JEVITY 1.2 CAL) 1,000 mL (05/23/19 1324)   heparin 2,350 Units/hr (05/23/19 1349)   penicillin g continuous IV infusion 12 Million Units (05/23/19 1337)   PRN Meds:.acetaminophen, [DISCONTINUED] acetaminophen **OR** acetaminophen, chlorpheniramine-HYDROcodone, guaiFENesin-dextromethorphan, hydrALAZINE, lidocaine (PF), metoprolol tartrate, morphine injection, nitroGLYCERIN, ondansetron **OR** ondansetron (ZOFRAN) IV, oxyCODONE  Antibiotics  :   Anti-infectives (From admission, onward)   Start     Dose/Rate Route Frequency Ordered Stop   05/15/19 1430  penicillin G potassium 12 Million Units in dextrose 5 % 500 mL continuous infusion     12 Million Units 41.7 mL/hr over 12 Hours Intravenous Every 12 hours 05/15/19 1343 05/24/19 2359   05/13/19 1000  penicillin G potassium 12 Million Units in dextrose 5 % 500 mL continuous infusion  Status:  Discontinued     12 Million Units 41.7 mL/hr over 12 Hours Intravenous Every 12 hours 05/12/19 1159 05/15/19 1343   05/11/19 0730  cefTRIAXone (ROCEPHIN) 2 g in sodium chloride 0.9 % 100 mL IVPB  Status:  Discontinued     2 g 200 mL/hr over 30 Minutes  Intravenous Every 24 hours 05/11/19 0721 05/12/19 1159   05/08/19 0200  remdesivir 100 mg in sodium chloride 0.9 % 250 mL IVPB     100 mg 500 mL/hr over 30 Minutes Intravenous Every 24 hours 05/07/19 0059 05/11/19 0513   05/07/19 0100  remdesivir 200 mg in sodium chloride 0.9 % 250 mL IVPB     200 mg 500 mL/hr over 30 Minutes Intravenous Once 05/07/19 0057 05/07/19 0512       Objective   Vitals:   05/22/19 2219 05/23/19 0300 05/23/19 0745 05/23/19 0803  BP: (!) 127/53   121/63  Pulse: 88   75  Resp: 20     Temp: 98.4 F (36.9 C)   99.8 F (37.7 C)  TempSrc: Oral   Oral  SpO2: 97%  96% 95%  Weight:  111.6 kg    Height:        SpO2: 95 % O2 Flow Rate (L/min): 2 L/min FiO2 (%): 32 %  Wt Readings from Last 3 Encounters:  05/23/19 111.6 kg  03/24/19 120.7 kg  02/16/19 117.9 kg  Intake/Output Summary (Last 24 hours) at 05/23/2019 1559 Last data filed at 05/23/2019 0800 Gross per 24 hour  Intake 0 ml  Output 901 ml  Net -901 ml    Physical Exam:  Oriented to self, place, context, sleepy but easily arousable No new F.N deficits, following commands and moving extremities appropriately (4-5 strength in upper extremities bilaterally, able to wiggle toes bilateral lower legs) No JVD appreciated Symmetrical Chest wall movement, diffuse rhonchi in upper chest field (unchanged with disease no respiratory distress on 3 L RRR,No Gallops,Rubs or new Murmurs,  +ve B.Sounds, Abd Soft, No tenderness, No rebound, guarding or rigidity. No Cyanosis, Clubbing or edema,  No new Rash or bruise     I have personally reviewed the following:   Data Reviewed:  CBC Recent Labs  Lab 05/19/19 0942 05/20/19 0406 05/21/19 0340 05/22/19 0415 05/23/19 0455  WBC 13.5* 12.4* 9.6 9.6 9.5  HGB 10.5* 9.4* 9.4* 9.5* 9.2*  HCT 32.0* 29.6* 28.2* 28.8* 28.9*  PLT 159 184 200 205 207  MCV 86.0 85.8 85.5 86.2 87.6  MCH 28.2 27.2 28.5 28.4 27.9  MCHC 32.8 31.8 33.3 33.0 31.8  RDW 15.4  15.5 15.6* 15.8* 15.9*    Chemistries  Recent Labs  Lab 05/17/19 0521 05/19/19 0942 05/19/19 1516 05/20/19 0406 05/21/19 0340 05/22/19 0415 05/23/19 0824  NA 129* 127* 128* 130* 130* 127* 127*  K 4.6 5.3* 4.9 5.4* 4.8 4.8 5.1  CL 96* 93* 94* 96* 94* 91* 89*  CO2 '25 27 26 27 27 26 27  '$ GLUCOSE 194* 207* 182* 152* 130* 199* 300*  BUN '14 20 19 16 12 18 20  '$ CREATININE 0.79 0.91 0.88 0.79 0.66 0.75 0.79  CALCIUM 8.1* 7.9* 7.7* 7.9* 7.9* 7.8* 7.9*  AST 20 41  --   --   --   --   --   ALT 16 19  --   --   --   --   --   ALKPHOS 62 65  --   --   --   --   --   BILITOT 1.1 1.2  --   --   --   --   --    ------------------------------------------------------------------------------------------------------------------ No results for input(s): CHOL, HDL, LDLCALC, TRIG, CHOLHDL, LDLDIRECT in the last 72 hours.  Lab Results  Component Value Date   HGBA1C 7.4 (H) 03/24/2019   ------------------------------------------------------------------------------------------------------------------ No results for input(s): TSH, T4TOTAL, T3FREE, THYROIDAB in the last 72 hours.  Invalid input(s): FREET3 ------------------------------------------------------------------------------------------------------------------ No results for input(s): VITAMINB12, FOLATE, FERRITIN, TIBC, IRON, RETICCTPCT in the last 72 hours.  Coagulation profile No results for input(s): INR, PROTIME in the last 168 hours.  No results for input(s): DDIMER in the last 72 hours.  Cardiac Enzymes No results for input(s): CKMB, TROPONINI, MYOGLOBIN in the last 168 hours.  Invalid input(s): CK ------------------------------------------------------------------------------------------------------------------    Component Value Date/Time   BNP 326.0 (H) 05/06/2019 1552    Micro Results No results found for this or any previous visit (from the past 240 hour(s)).  Radiology Reports Dg Pelvis 1-2 Views  Result Date:  05/06/2019 CLINICAL DATA:  Low back pain after fall. EXAM: PELVIS - 1-2 VIEW COMPARISON:  Pelvic and right hip x-rays dated September 08, 2018. FINDINGS: There is no evidence of pelvic fracture or diastasis. Prior right total hip arthroplasty. Unchanged moderate left hip osteoarthritis. IMPRESSION: No acute osseous abnormality. Electronically Signed   By: Titus Dubin M.D.   On: 05/06/2019 19:24   Dg Forearm Right  Result Date: 05/06/2019 CLINICAL DATA:  Right forearm pain after fall. EXAM: RIGHT FOREARM - 2 VIEW COMPARISON:  Right wrist and elbow x-rays dated May 16, 2018. FINDINGS: There is no evidence of fracture or other focal bone lesions. Soft tissues are unremarkable. IMPRESSION: Negative. Electronically Signed   By: Titus Dubin M.D.   On: 05/06/2019 19:13   Dg Abd 1 View  Result Date: 05/15/2019 CLINICAL DATA:  Evaluate for constipation. EXAM: ABDOMEN - 1 VIEW COMPARISON:  CT abdomen pelvis 04/11/2018. FINDINGS: Single AP supine radiograph of the abdomen through the pubic symphysis. The lung bases are excluded from field of view. Gastric distention. There are no dilated loops of bowel to suggest obstruction. Mild stool burden. No supine evidence for free air. Status post right hip arthroplasty, incompletely visualized. IMPRESSION: Gastric distention. Nonobstructive bowel gas pattern. Mild stool burden. Electronically Signed   By: Audie Pinto M.D.   On: 05/15/2019 18:25   Ct Lumbar Spine Wo Contrast  Result Date: 05/22/2019 CLINICAL DATA:  Preop for spine surgery. Mazor protocol. History of fracture through the L1-2 disc space. EXAM: CT LUMBAR SPINE WITHOUT CONTRAST TECHNIQUE: Multidetector CT imaging of the lumbar spine was performed without intravenous contrast administration. Multiplanar CT image reconstructions were also generated. COMPARISON:  CT lumbar spine 05/11/2019 FINDINGS: Segmentation: There are five lumbar type vertebral bodies. The last full intervertebral disc space  is labeled L5-S1. This correlates with the prior studies. Alignment: Normal in stable. Vertebrae: Stable age advanced osteoporosis and severe multilevel facet disease. Stable widening anteriorly at the L1-2 disc space. I do not see any definite disruption of the facets at this level. No new/acute bony findings. Paraspinal and other soft tissues: No significant findings. Disc levels: No interval change since the recent CT scan from 10 days ago. There is multilevel disc disease and severe facet disease with large bridging fusing osteophytes likely changes of DISH. Ankylosing spondylitis is another possibility. Stable wide decompressive laminectomy at L5-S1. IMPRESSION: Stable appearance of the lumbar spine when compared to 2 prior studies from 05/06/2019 and 05/11/2019. Fracture through the L1-2 disc space with anterior widening but no obvious posterior element fractures. Stable underlying changes of ankylosing spondylitis or DISH. Electronically Signed   By: Marijo Sanes M.D.   On: 05/22/2019 11:48   Ct Lumbar Spine Wo Contrast  Result Date: 05/11/2019 CLINICAL DATA:  Lumbar fracture.  Surgical planning. EXAM: CT LUMBAR SPINE WITHOUT CONTRAST TECHNIQUE: Multidetector CT imaging of the lumbar spine was performed without intravenous contrast administration. Multiplanar CT image reconstructions were also generated. COMPARISON:  05/06/2019 FINDINGS: The study is mildly motion degraded despite repeat imaging. Segmentation: 5 lumbar type vertebrae. Alignment: Trace retrolisthesis of L5 on S1. Vertebrae: Diffuse ankylosis is again noted throughout the lumbar and included lower thoracic spine as well as across both SI joints. A transverse fracture is again seen through the L1-2 disc space including across the bridging osteophytes/syndesmophytes. Posterior element extension was better demonstrated on the prior study due to motion and image noise on today's examination. Anterior widening of the L1-2 disc space is  unchanged. No new fracture is identified. Paraspinal and other soft tissues: Small pleural effusions and dependent atelectasis in the lung bases. Aortic atherosclerosis. Partially visualized right hip arthroplasty. Disc levels: Disc space narrowing greatest at L5-S1 where endplate and facet spurring result in severe bilateral neural foraminal stenosis. IMPRESSION: 1. Lumbar spine CT for surgical planning. 2. Findings suggestive of ankylosing spondylitis with fracture through the L1-2 disc space as previously described. Unchanged anterior  disc space widening. 3.  Aortic Atherosclerosis (ICD10-I70.0). Electronically Signed   By: Logan Bores M.D.   On: 05/11/2019 14:04   Ct Lumbar Spine Wo Contrast  Result Date: 05/06/2019 CLINICAL DATA:  Tender to palpation over the lower lumbar spine, fall EXAM: CT LUMBAR SPINE WITHOUT CONTRAST TECHNIQUE: Multidetector CT imaging of the lumbar spine was performed without intravenous contrast administration. Multiplanar CT image reconstructions were also generated. COMPARISON:  CT April 11, 2018 FINDINGS: Segmentation: There are 5 non-rib bearing lumbar type vertebral bodies with the last intervertebral disc space labeled as L5-S1. Alignment: Normal Vertebrae: There is a fracture seen through the anterior bridging osteophyte of L1 with widening of the anterior inter disc space which measures 1.6 cm. The fracture extends through the right posterior bridging osteophyte and pedicle as well as through the left-sided pedicle and superior articulating facet. There is also a minimally displaced fracture seen at the right-sided inferior corner of the L1 vertebral body. The fracture also extends through the L1 spinous process. There is minimal right-sided facet joint widening seen at L1-L2. No jumped facets however are noted. A small amount of air seen within the inter disc space at L1-L2. No other definite fracture is identified. There are bridging anterior osteophytes seen through the  remainder of the lumbar spine. There inter disc calcifications. There is findings of of diffuse enthesopathy seen between the spinous processes in the lumbar spine. Paraspinal and other soft tissues: Mild prevertebral soft tissue swelling is seen at L1-L2. No large hematoma however is noted. The sacroiliac joints are intact. There is diffuse ankylosis seen around the bilateral sacroiliac joints. Scattered aortic atherosclerosis is noted. Disc levels: Degenerative changes are seen throughout the lumbar spine with disc height loss and facet arthropathy which is most notable at L5-S1 with severe bilateral neural foraminal narrowing. IMPRESSION: 1. Fracture through the anterior bridging osteophyte of L1-2 with widening of the inter disc space . The fracture extends through the posterior columns at L1, as described above through the pedicles, facet, and spinous process. 2. Mild prevertebral soft tissue swelling is seen at this level. 3. Findings suggestive of ankylosing spondylitis. 4.  Aortic Atherosclerosis (ICD10-I70.0). These results were called by telephone at the time of interpretation on 05/06/2019 at 4:56 pm to provider [facility] , who verbally acknowledged these results. Electronically Signed   By: Prudencio Pair M.D.   On: 05/06/2019 17:02   Dg Chest Port 1 View  Result Date: 05/21/2019 CLINICAL DATA:  Dyspnea and aspiration EXAM: PORTABLE CHEST 1 VIEW COMPARISON:  May 18, 2019 FINDINGS: The mediastinal contour and cardiac silhouette are stable. A feeding tube is identified with distal tip not included on film. Right central venous line is identified with distal tip in the superior vena cava. The heart size is enlarged. Increased pulmonary interstitium is identified bilaterally. There is no focal pneumonia or pleural effusion. The bony structures are stable. IMPRESSION: Cardiomegaly with mild interstitial edema. Electronically Signed   By: Abelardo Diesel M.D.   On: 05/21/2019 12:31   Dg Chest  Port 1 View  Result Date: 05/18/2019 CLINICAL DATA:  Pneumonia. EXAM: PORTABLE CHEST 1 VIEW COMPARISON:  May 15, 2019. FINDINGS: Stable cardiomegaly. No pneumothorax or pleural effusion is noted. Right-sided PICC line is noted with distal tip in expected position of cavoatrial junction. No acute pulmonary disease is noted. Bony thorax is unremarkable. IMPRESSION: Right-sided PICC line is been repositioned with tip in expected position of cavoatrial junction. No acute cardiopulmonary abnormality seen. Electronically Signed  By: Marijo Conception M.D.   On: 05/18/2019 10:52   Dg Chest Port 1 View  Result Date: 05/15/2019 CLINICAL DATA:  68 year old male with a history of PICC placement EXAM: PORTABLE CHEST 1 VIEW COMPARISON:  None. FINDINGS: Cardiomediastinal silhouette unchanged in size and contour with surgical changes of median sternotomy and cardiomegaly. No pneumothorax. No pleural effusion. Coarsened interstitial markings throughout. Right upper extremity PICC is redundant, without visualization of the point of termination of the tip. Redemonstration of partially healed right-sided rib fractures IMPRESSION: Right upper extremity PICC is redundant within the veins in the tip is not visualized. Repositioning is indicated. Similar appearance of the lungs with diffuse reticulonodular opacities. Similar appearance of surgical changes of median sternotomy and cardiomegaly. Electronically Signed   By: Corrie Mckusick D.O.   On: 05/15/2019 11:13   Dg Chest Port 1 View  Result Date: 05/10/2019 CLINICAL DATA:  68 year old male positive for COVID-19. Fever. EXAM: PORTABLE CHEST 1 VIEW COMPARISON:  Portable chest 05/06/2019 and earlier. FINDINGS: Portable AP semi upright view at 1645 hours. Chronic cardiomegaly. Mildly lower lung volumes compared to October. New bilateral basilar predominant confluent and indistinct pulmonary opacity. No superimposed pneumothorax or pleural effusion. Prior sternotomy. No acute  osseous abnormality identified. IMPRESSION: 1. New from 05/06/2019 bilateral basilar predominant bilateral pulmonary opacity compatible with COVID-19 pneumonia. 2. Chronic cardiomegaly. Electronically Signed   By: Genevie Ann M.D.   On: 05/10/2019 17:04   Dg Chest Portable 1 View  Result Date: 05/06/2019 CLINICAL DATA:  Fall. EXAM: PORTABLE CHEST 1 VIEW COMPARISON:  Chest x-ray dated January 13, 2019. FINDINGS: Stable cardiomegaly. Pulmonary vascular congestion. No focal consolidation, pleural effusion, or pneumothorax. No acute osseous abnormality. Old right-sided rib fractures again noted. IMPRESSION: Cardiomegaly with pulmonary vascular congestion. Electronically Signed   By: Titus Dubin M.D.   On: 05/06/2019 19:11   Vas [place] Lower Extremity Venous (dvt)  Result Date: 05/08/2019  Lower Venous Study Indications: Covid positive, elevated D-Dimer. Has unstable lumbar fracture.  Limitations: Patient in severe pain with compression and body habitus. Comparison Study: No prior study on file for comparison Performing Technologist: Sharion Dove RVS  Examination Guidelines: A complete evaluation includes B-mode imaging, spectral Doppler, color Doppler, and power Doppler as needed of all accessible portions of each vessel. Bilateral testing is considered an integral part of a complete examination. Limited examinations for reoccurring indications may be performed as noted.  +---------+---------------+---------+-----------+----------+--------------+  RIGHT     Compressibility Phasicity Spontaneity Properties Thrombus Aging  +---------+---------------+---------+-----------+----------+--------------+  CFV       Full            Yes       Yes                                    +---------+---------------+---------+-----------+----------+--------------+  SFJ       Full                                                             +---------+---------------+---------+-----------+----------+--------------+  FV Prox   Full                                                              +---------+---------------+---------+-----------+----------+--------------+  FV Mid    Full                                                             +---------+---------------+---------+-----------+----------+--------------+  FV Distal Full                                                             +---------+---------------+---------+-----------+----------+--------------+  PFV       Full                                                             +---------+---------------+---------+-----------+----------+--------------+  POP       Full            Yes       Yes                                    +---------+---------------+---------+-----------+----------+--------------+  PTV       Full                                                             +---------+---------------+---------+-----------+----------+--------------+  PERO      Full                                                             +---------+---------------+---------+-----------+----------+--------------+   +---------+---------------+---------+-----------+----------+--------------+  LEFT      Compressibility Phasicity Spontaneity Properties Thrombus Aging  +---------+---------------+---------+-----------+----------+--------------+  CFV       Full            Yes       Yes                                    +---------+---------------+---------+-----------+----------+--------------+  SFJ       Full                                                             +---------+---------------+---------+-----------+----------+--------------+  FV Prox   Full                                                             +---------+---------------+---------+-----------+----------+--------------+  FV Mid    Full                                                             +---------+---------------+---------+-----------+----------+--------------+  FV Distal Full                                                              +---------+---------------+---------+-----------+----------+--------------+  PFV       Full                                                             +---------+---------------+---------+-----------+----------+--------------+  POP       Full            Yes       Yes                                    +---------+---------------+---------+-----------+----------+--------------+  PTV       Full                                                             +---------+---------------+---------+-----------+----------+--------------+  PERO      Full                                                             +---------+---------------+---------+-----------+----------+--------------+     Summary: Right: There is no evidence of deep vein thrombosis in the lower extremity. Left: There is no evidence of deep vein thrombosis in the lower extremity.  *See table(s) above for measurements and observations. Electronically signed by Deitra Mayo MD on 05/08/2019 at 5:56:44 PM.    Final    Ir Picc Placement Right >5 Yrs Inc Img Guide  Result Date: 05/17/2019 INDICATION: BACTEREMIA, ACCESS FOR LONG-TERM ANTIBIOTICS EXAM: ULTRASOUND AND FLUOROSCOPIC GUIDED PICC LINE INSERTION MEDICATIONS: 1% LIDOCAINE LOCAL CONTRAST:  None FLUOROSCOPY TIME:  120 seconds (12 mGy) COMPLICATIONS: None immediate. TECHNIQUE: The procedure, risks, benefits, and alternatives were explained to the patient's family and informed written consent was obtained. A timeout was performed prior to the initiation of the procedure. The right upper extremity was prepped with chlorhexidine in a sterile fashion, and a sterile drape was applied covering the operative field. Maximum barrier sterile technique with sterile gowns and gloves were used for the procedure. A timeout was performed prior to the initiation of the procedure. Local anesthesia was provided with 1% lidocaine. Under direct ultrasound guidance, the right brachial vein was  accessed with a  micropuncture kit after the overlying soft tissues were anesthetized with 1% lidocaine. An ultrasound image was saved for documentation purposes. A guidewire was advanced to the level of the superior caval-atrial junction for measurement purposes and the PICC line was cut to length. A peel-away sheath was placed and a 45 cm, 5 [place], dual lumen was inserted to level of the superior caval-atrial junction. A post procedure spot fluoroscopic was obtained. The catheter easily aspirated and flushed and was sutured in place. A dressing was placed. The patient tolerated the procedure well without immediate post procedural complication. FINDINGS: After catheter placement, the tip lies within the superior cavoatrial junction. The catheter aspirates and flushes normally and is ready for immediate use. IMPRESSION: Successful ultrasound and fluoroscopic guided placement of a right brachial vein approach, 45 cm, 5 [place], dual lumen PICC with tip at the superior caval-atrial junction. The PICC line is ready for immediate use. Electronically Signed   By: Jerilynn Mages.  Shick M.D.   On: 05/17/2019 16:35   [place] Ekg Site Rite  Result Date: 05/14/2019 If Site Rite image not attached, placement could not be confirmed due to current cardiac rhythm.    Time Spent in minutes  30     Desiree Hane M.D on 05/23/2019 at 3:59 PM  To page go to [email or web address] - password [email or web address]

## 2019-05-24 DIAGNOSIS — J9611 Chronic respiratory failure with hypoxia: Secondary | ICD-10-CM | POA: Diagnosis not present

## 2019-05-24 DIAGNOSIS — R7881 Bacteremia: Secondary | ICD-10-CM | POA: Diagnosis not present

## 2019-05-24 DIAGNOSIS — I5022 Chronic systolic (congestive) heart failure: Secondary | ICD-10-CM | POA: Diagnosis not present

## 2019-05-24 DIAGNOSIS — I482 Chronic atrial fibrillation, unspecified: Secondary | ICD-10-CM | POA: Diagnosis not present

## 2019-05-24 LAB — CBC
HCT: 28 % — ABNORMAL LOW (ref 39.0–52.0)
Hemoglobin: 9 g/dL — ABNORMAL LOW (ref 13.0–17.0)
MCH: 27.5 pg (ref 26.0–34.0)
MCHC: 32.1 g/dL (ref 30.0–36.0)
MCV: 85.6 fL (ref 80.0–100.0)
Platelets: 189 10*3/uL (ref 150–400)
RBC: 3.27 MIL/uL — ABNORMAL LOW (ref 4.22–5.81)
RDW: 15.8 % — ABNORMAL HIGH (ref 11.5–15.5)
WBC: 9.4 10*3/uL (ref 4.0–10.5)
nRBC: 0 % (ref 0.0–0.2)

## 2019-05-24 LAB — BASIC METABOLIC PANEL
Anion gap: 10 (ref 5–15)
Anion gap: 12 (ref 5–15)
BUN: 19 mg/dL (ref 8–23)
BUN: 19 mg/dL (ref 8–23)
CO2: 29 mmol/L (ref 22–32)
CO2: 29 mmol/L (ref 22–32)
Calcium: 7.9 mg/dL — ABNORMAL LOW (ref 8.9–10.3)
Calcium: 8 mg/dL — ABNORMAL LOW (ref 8.9–10.3)
Chloride: 87 mmol/L — ABNORMAL LOW (ref 98–111)
Chloride: 87 mmol/L — ABNORMAL LOW (ref 98–111)
Creatinine, Ser: 0.67 mg/dL (ref 0.61–1.24)
Creatinine, Ser: 0.84 mg/dL (ref 0.61–1.24)
GFR calc Af Amer: >60 mL/min (ref >60)
GFR calc Af Amer: >60 mL/min (ref >60)
GFR calc non Af Amer: >60 mL/min (ref >60)
GFR calc non Af Amer: >60 mL/min (ref >60)
Glucose, Bld: 260 mg/dL — ABNORMAL HIGH (ref 70–99)
Glucose, Bld: 292 mg/dL — ABNORMAL HIGH (ref 70–99)
Potassium: 4.9 mmol/L (ref 3.5–5.1)
Potassium: 5.1 mmol/L (ref 3.5–5.1)
Sodium: 126 mmol/L — ABNORMAL LOW (ref 135–145)
Sodium: 128 mmol/L — ABNORMAL LOW (ref 135–145)

## 2019-05-24 LAB — GLUCOSE, CAPILLARY
Glucose-Capillary: 242 mg/dL — ABNORMAL HIGH (ref 70–99)
Glucose-Capillary: 263 mg/dL — ABNORMAL HIGH (ref 70–99)
Glucose-Capillary: 284 mg/dL — ABNORMAL HIGH (ref 70–99)
Glucose-Capillary: 274 mg/dL — ABNORMAL HIGH (ref 70–99)
Glucose-Capillary: 267 mg/dL — ABNORMAL HIGH (ref 70–99)
Glucose-Capillary: 277 mg/dL — ABNORMAL HIGH (ref 70–99)

## 2019-05-24 LAB — CREATININE, URINE, RANDOM: Creatinine, Urine: 49.37 mg/dL

## 2019-05-24 LAB — OSMOLALITY, URINE: Osmolality, Ur: 457 mOsm/kg (ref 300–900)

## 2019-05-24 LAB — HEPARIN LEVEL (UNFRACTIONATED): Heparin Unfractionated: 0.33 IU/mL (ref 0.30–0.70)

## 2019-05-24 LAB — TSH: TSH: 0.485 u[IU]/mL (ref 0.350–4.500)

## 2019-05-24 LAB — SURGICAL PCR SCREEN
MRSA, PCR: NEGATIVE
Staphylococcus aureus: NEGATIVE

## 2019-05-24 LAB — SODIUM, URINE, RANDOM: Sodium, Ur: 42 mmol/L

## 2019-05-24 MED ORDER — FUROSEMIDE 10 MG/ML IJ SOLN
40.0000 mg | Freq: Two times a day (BID) | INTRAMUSCULAR | Status: DC
  Administered 2019-05-24 – 2019-05-25 (×2): 40 mg via INTRAVENOUS
  Filled 2019-05-24 (×2): qty 4

## 2019-05-24 MED ORDER — INSULIN GLARGINE 100 UNIT/ML ~~LOC~~ SOLN: 55.0000 [IU] | Freq: Every day | SUBCUTANEOUS | Status: DC

## 2019-05-24 MED ORDER — INSULIN GLARGINE 100 UNIT/ML ~~LOC~~ SOLN
25.0000 [IU] | Freq: Every day | SUBCUTANEOUS | Status: DC
  Filled 2019-05-24 (×2): qty 0.25

## 2019-05-24 NOTE — Anesthesia Preprocedure Evaluation (Addendum)
Anesthesia Evaluation  Patient identified by MRN, date of birth, ID band Patient awake    Reviewed: Allergy & Precautions, NPO status , Patient's Chart, lab work & pertinent test results  History of Anesthesia Complications Negative for: history of anesthetic complications  Airway Mallampati: II  TM Distance: >3 FB Neck ROM: Full    Dental  (+) Poor Dentition   Pulmonary sleep apnea , COPD, former smoker,  COVID-19 positive   Pulmonary exam normal        Cardiovascular hypertension, Pt. on medications and Pt. on home beta blockers + CAD (mild nonobstructive) and +CHF  + dysrhythmias Atrial Fibrillation + Valvular Problems/Murmurs (s/p MVR for h/o endocarditis (2014))  Rhythm:Irregular     Neuro/Psych Unstable lumbar spine fracture negative psych ROS   GI/Hepatic Neg liver ROS, GERD  ,gastroparesis   Endo/Other  diabetes, Type 2, Insulin Dependent  Renal/GU negative Renal ROS  negative genitourinary   Musculoskeletal negative musculoskeletal ROS (+)   Abdominal   Peds  Hematology negative hematology ROS (+)   Anesthesia Other Findings Echo 05/20/19: EF 40-45%, mod LVH, mildly reduced RV function, bioprosthetic mitral valve  Reproductive/Obstetrics                            Anesthesia Physical Anesthesia Plan  ASA: III  Anesthesia Plan: General   Post-op Pain Management:    Induction: Intravenous, Rapid sequence and Cricoid pressure planned  PONV Risk Score and Plan: 2 and Ondansetron, Dexamethasone, Treatment may vary due to age or medical condition and Midazolam  Airway Management Planned: Oral ETT  Additional Equipment: Arterial line  Intra-op Plan:   Post-operative Plan: Extubation in OR  Informed Consent: I have reviewed the patients History and Physical, chart, labs and discussed the procedure including the risks, benefits and alternatives for the proposed anesthesia  with the patient or authorized representative who has indicated his/her understanding and acceptance.     Dental advisory given  Plan Discussed with:   Anesthesia Plan Comments: (PIV x2, arterial line, RSI)       Anesthesia Quick Evaluation

## 2019-05-24 NOTE — Progress Notes (Signed)
  Speech Language Pathology Treatment: Dysphagia  Patient Details Name: Cody Hale MRN: [id number] DOB: [birth year] Today's Date: 05/24/2019 Time: 4098-1191 SLP Time Calculation (min) (ACUTE ONLY): 18 min  Assessment / Plan / Recommendation Clinical Impression  Pt was encountered asleep in bed; however, he roused to moderate verbal stimulation.  Pt was observed to have wet vocal quality and a weak, congested cough at baseline.  Attempted to sit pt's HOB up; however, he was only able to tolerate an increase to 32 degrees.  Oral care was completed with suction swab prior to po trials and pt was observed to have a dry oral cavity; particularly on his lingual surface.  Pt was seen with trials of small ice chips, thin liquid via 1/2 tsp, and 1/4 tsp of puree.  He exhibited poor bolus acceptance with reduced labial seal despite max verbal cues.  Instead of labial closure, pt attempted to remove the po trials from the spoon via lateral lingual movement on either side of the spoon.  He exhibited prolonged AP transport and suspected delayed swallow initiation with all po trials.  He additionally presented with an immediate, weak cough following all po trials. Oral suction was provided following trials.  Recommend continuation of NPO with alternative means of nutrition at this time.  Plan for instrumental swallow study when pt is able to tolerate.     HPI HPI: Cody Hale is a 68 y.o. 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 Hospital course complicated by [id number] infection positive with no symptoms though he did complete 5 days of remdesivir and full course of heparin drip  GBS bacteremia IV penicillin. Pt noted to grimace with pills.       SLP Plan  Continue with current plan of care;New goals to be determined pending instrumental study       Recommendations  Diet recommendations: NPO Medication Administration: Via alternative means                Oral Care Recommendations: Oral care QID;Staff/trained caregiver to provide oral care SLP Visit Diagnosis: Dysphagia, oropharyngeal phase (R13.12) Plan: Continue with current plan of care;New goals to be determined pending instrumental study       Colin Mulders M.S., [place] Office: [phone]               [place] 05/24/2019, 11:50 AM

## 2019-05-24 NOTE — Plan of Care (Signed)
  Problem: Clinical Measurements: Goal: Ability to maintain clinical measurements within normal limits will improve Outcome: Progressing Goal: Will remain free from infection Outcome: Progressing Goal: Respiratory complications will improve Outcome: Progressing   Problem: Pain Managment: Goal: General experience of comfort will improve Outcome: Progressing   Problem: Safety: Goal: Ability to remain free from injury will improve Outcome: Progressing   Problem: Skin Integrity: Goal: Risk for impaired skin integrity will decrease Outcome: Progressing   

## 2019-05-24 NOTE — Plan of Care (Signed)
  Problem: Activity: Goal: Risk for activity intolerance will decrease Outcome: Not Progressing   Problem: Nutrition: Goal: Adequate nutrition will be maintained Outcome: Progressing   Problem: Elimination: Goal: Will not experience complications related to bowel motility Outcome: Progressing Goal: Will not experience complications related to urinary retention Outcome: Progressing   Problem: Safety: Goal: Ability to remain free from injury will improve Outcome: Progressing

## 2019-05-24 NOTE — Progress Notes (Signed)
  NEUROSURGERY PROGRESS NOTE   No issues overnight.  Confused, although easily reoriented  EXAM:  BP 136/64   Pulse 80   Temp 99.5 F (37.5 C) (Oral)   Resp 20   Ht 5\' 9"  (1.753 m)   Wt 111.6 kg   SpO2 96%   BMI 36.33 kg/m   Awake, alert, oriented to self but not location or year Speech slow CN grossly intact  MAEW, nonfocal BLE  IMPRESSION/PLAN 68 y.o. male with unstable L1-2 fracture, COVID-19 pneumonia, A-fib, prosthetic mitral valve on heparin, and bacteremia which appears to have cleared. TEE negative for endocarditis. Encephalopathic. - Will proceed with T11-L4 fusion for stabilization of L1-2 fracture if medically cleared by primary team tomorrow - Need to hold all blood thinning medications in preparation for surgery tomorrow - NPO at midnight - Will discuss with son to obtain consent

## 2019-05-24 NOTE — Progress Notes (Signed)
Pt continues to refuse repositioning.  States he is comfortable. Denies pain.  No signs of distress.  Sleepy but awakens easily and responds appropriately.

## 2019-05-24 NOTE — Progress Notes (Signed)
Pt slept majority of this shift.  When awakened, he is oriented x3-4.  Able to correctly identify medication information.  Offered multiple times to reposition him, he refuses.  States he is comfortable how he is.    No signs of acute distress.  Good sensation to BLE.  Cough noted.  No SOB.    Continue to monitor.

## 2019-05-24 NOTE — Progress Notes (Signed)
[facility]  PROGRESS NOTE  Cody Hale WUJ:[id number] DOB: 15-Nov-1950 DOA: 05/06/2019 PCP: [name], MD Admit date - 05/06/2019   Admitting Physician Alma Friendly, MD  Outpatient Primary MD for the patient is [name], MD  LOS - 18 Brief Narrative   Cody Hale is a 68 y.o. 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  Hospital course complicated by [id number] infection positive with no symptoms though he did complete 5 days of remdesivir and full course of heparin drip GBS bacteremia IV penicillin, currently awaiting TEE results  Subjective  Cody Hale still has wet cough.  Denies any shortness of breath, fevers or chills.  Tolerating NG tube. A & P   Unstable L1-L2 fracture, stable. 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 -Plan for OR with neurosurgery, possibly 11/17 per neurosurgery  GBS bacteremia with history of mitral prosthetic valve, stable.  No vegetations on TEE -will complete penicillin on 11/16.     COVID 19 pneumonia seems resolved. Repeat CXR shows resolution of consolidation/opacities. Ferritin wnl, CRP elevated but has concern for ankylosing spondylitis. CXR more consistent with likely pulm edema. -Continue vitamin C and zinc,   Hyponatremia with normal serum osm ( 281), persists.  Suspect related to hypovolemia in setting of CHF flare. -Check urine osmolality, urine sodium, urine creatinine  -Repeat BMP in a.m. -C IV Lasix, monitor output.  Hyperkalemia, intermittent  Has responded well to [facility] -Monitor BMP.   Pharyngeal  dysphagia and possible aspiration per Speech eval on, currently receiving feedings via NG tube -NPO -Holding feedings at midnight in case of procedure 1/17 as well as scheduled insulin,   CHF with reduced EF (EF 40-45%, TTE 05/20/19), mild exacerbation, slightly hypervolemic with peripheral edema. -Continue IV Lasix -Daily weights, strict I's and O's -Monitor BMP -Continue Coreg   Chronic atrial fibrillation, rate controlled. -Holding home Xarelto, -Continue heparin drip--pending potential neurosurgery on 11/17  Type 2 diabetes, with worsening hyperglycemia in the setting of continuous tube feeds, -Decrease Lantus to 25 units given potential surgery on 11/17 -Discontinue scheduled short acting to prevent hypoglycemia related to above -Monitor sliding scale -Holding home oral hypoglycemics.    Thrombocytopenia, resolved.   no active signs of bleeding, -Monitor CBC.  GERD, stable. -Continue Protonix  Diminished appetite. -Continue Ensure twice daily  COPD, stable. -Continue Incruse.     Family Communication  : Spoke with son Kelon Easom at [phone] on 05/22/19, will update again   Code Status :  FULL  Disposition Plan  : Continue inpatient stay, IV penicillin until 11/16, then plan for or intervention for fx's by neurosurgery potentially on 11/17  Consults  : ID, neurosurgery, cardiology Procedures  : 10/31 venous duplex TEE, 05/20/19 pending results  DVT Prophylaxis  : IV Heparin  Lab Results  Component Value Date   PLT 189 05/24/2019    Diet :  Diet Order            Diet NPO time specified  Diet effective now               Inpatient Medications Scheduled Meds:  ascorbic acid  500 mg Per  Tube Daily   carvedilol  3.125 mg Per Tube BID   Chlorhexidine Gluconate Cloth  6 each Topical Daily   docusate  100 mg Per Tube Daily   feeding supplement (PRO-STAT SUGAR FREE 64)  30 mL Per Tube TID   fluticasone  2 spray Each Nare Daily   fluticasone  furoate-vilanterol  1 puff Inhalation Daily   furosemide  40 mg Intravenous BID   insulin aspart  0-15 Units Subcutaneous Q4H   insulin aspart  8 Units Subcutaneous Q4H   [START ON 05/25/2019] insulin glargine  55 Units Subcutaneous Daily   loratadine  10 mg Per Tube QPM   multivitamin with minerals  1 tablet Per Tube Daily   pantoprazole sodium  40 mg Per Tube Daily   polyethylene glycol  17 g Per Tube Daily   senna-docusate  2 tablet Per Tube BID   sodium zirconium cyclosilicate  5 g Oral Once   umeclidinium bromide  1 puff Inhalation Daily   zinc sulfate  220 mg Per Tube Daily   Continuous Infusions:  feeding supplement (JEVITY 1.2 CAL) 1,000 mL (05/24/19 1034)   heparin 2,350 Units/hr (05/24/19 1233)   penicillin g continuous IV infusion 12 Million Units (05/24/19 1521)   PRN Meds:.acetaminophen, [DISCONTINUED] acetaminophen **OR** acetaminophen, chlorpheniramine-HYDROcodone, guaiFENesin-dextromethorphan, hydrALAZINE, lidocaine (PF), metoprolol tartrate, morphine injection, nitroGLYCERIN, ondansetron **OR** ondansetron (ZOFRAN) IV, oxyCODONE  Antibiotics  :   Anti-infectives (From admission, onward)   Start     Dose/Rate Route Frequency Ordered Stop   05/15/19 1430  penicillin G potassium 12 Million Units in dextrose 5 % 500 mL continuous infusion     12 Million Units 41.7 mL/hr over 12 Hours Intravenous Every 12 hours 05/15/19 1343 05/24/19 2359   05/13/19 1000  penicillin G potassium 12 Million Units in dextrose 5 % 500 mL continuous infusion  Status:  Discontinued     12 Million Units 41.7 mL/hr over 12 Hours Intravenous Every 12 hours 05/12/19 1159 05/15/19 1343   05/11/19 0730  cefTRIAXone (ROCEPHIN) 2 g in sodium chloride 0.9 % 100 mL IVPB  Status:  Discontinued     2 g 200 mL/hr over 30 Minutes Intravenous Every 24 hours 05/11/19 0721 05/12/19 1159   05/08/19 0200  remdesivir 100 mg in sodium chloride 0.9 % 250 mL IVPB     100 mg 500 mL/hr over 30 Minutes  Intravenous Every 24 hours 05/07/19 0059 05/11/19 0513   05/07/19 0100  remdesivir 200 mg in sodium chloride 0.9 % 250 mL IVPB     200 mg 500 mL/hr over 30 Minutes Intravenous Once 05/07/19 0057 05/07/19 0512       Objective   Vitals:   05/23/19 2130 05/23/19 2300 05/24/19 0746 05/24/19 1716  BP: 118/68 (!) 118/52 136/64 103/66  Pulse: 77 63 80 80  Resp:  '20 20 19  '$ Temp:  98.4 F (36.9 C) 99.5 F (37.5 C) 98.7 F (37.1 C)  TempSrc:  Oral Oral   SpO2:  97% 96% 96%  Weight:      Height:        SpO2: 96 % O2 Flow Rate (L/min): 2 L/min FiO2 (%): 32 %  Wt Readings from Last 3 Encounters:  05/23/19 111.6 kg  03/24/19 120.7 kg  02/16/19 117.9 kg     Intake/Output Summary (Last 24 hours) at 05/24/2019 1941 Last data filed at 05/24/2019 1836 Gross per 24 hour  Intake 2345.34 ml  Output 2921 ml  Net -575.66 ml    Physical  Exam:  Oriented to self, place, context, sleepy but easily arousable No new F.N deficits, following commands and moving extremities appropriately (4-5 strength in upper extremities bilaterally, able to wiggle toes bilateral lower legs) No JVD appreciated Symmetrical Chest wall movement, diffuse rhonchi in upper chest field (unchanged) no respiratory distress on 3 L RRR,No Gallops,Rubs or new Murmurs,  +ve B.Sounds, Abd Soft, No tenderness, No rebound, guarding or rigidity. No Cyanosis, Clubbing or edema,  No new Rash or bruise     I have personally reviewed the following:   Data Reviewed:  CBC Recent Labs  Lab 05/20/19 0406 05/21/19 0340 05/22/19 0415 05/23/19 0455 05/24/19 0341  WBC 12.4* 9.6 9.6 9.5 9.4  HGB 9.4* 9.4* 9.5* 9.2* 9.0*  HCT 29.6* 28.2* 28.8* 28.9* 28.0*  PLT 184 200 205 207 189  MCV 85.8 85.5 86.2 87.6 85.6  MCH 27.2 28.5 28.4 27.9 27.5  MCHC 31.8 33.3 33.0 31.8 32.1  RDW 15.5 15.6* 15.8* 15.9* 15.8*    Chemistries  Recent Labs  Lab 05/19/19 0942  05/21/19 0340 05/22/19 0415 05/23/19 0824 05/24/19 0341  05/24/19 1808  NA 127*   < > 130* 127* 127* 126* 128*  K 5.3*   < > 4.8 4.8 5.1 4.9 5.1  CL 93*   < > 94* 91* 89* 87* 87*  CO2 27   < > '27 26 27 29 29  '$ GLUCOSE 207*   < > 130* 199* 300* 260* 292*  BUN 20   < > '12 18 20 19 19  '$ CREATININE 0.91   < > 0.66 0.75 0.79 0.67 0.84  CALCIUM 7.9*   < > 7.9* 7.8* 7.9* 7.9* 8.0*  AST 41  --   --   --   --   --   --   ALT 19  --   --   --   --   --   --   ALKPHOS 65  --   --   --   --   --   --   BILITOT 1.2  --   --   --   --   --   --    < > = values in this interval not displayed.   ------------------------------------------------------------------------------------------------------------------ No results for input(s): CHOL, HDL, LDLCALC, TRIG, CHOLHDL, LDLDIRECT in the last 72 hours.  Lab Results  Component Value Date   HGBA1C 7.4 (H) 03/24/2019   ------------------------------------------------------------------------------------------------------------------ No results for input(s): TSH, T4TOTAL, T3FREE, THYROIDAB in the last 72 hours.  Invalid input(s): FREET3 ------------------------------------------------------------------------------------------------------------------ No results for input(s): VITAMINB12, FOLATE, FERRITIN, TIBC, IRON, RETICCTPCT in the last 72 hours.  Coagulation profile No results for input(s): INR, PROTIME in the last 168 hours.  No results for input(s): DDIMER in the last 72 hours.  Cardiac Enzymes No results for input(s): CKMB, TROPONINI, MYOGLOBIN in the last 168 hours.  Invalid input(s): CK ------------------------------------------------------------------------------------------------------------------    Component Value Date/Time   BNP 326.0 (H) 05/06/2019 1552    Micro Results No results found for this or any previous visit (from the past 240 hour(s)).  Radiology Reports Dg Pelvis 1-2 Views  Result Date: 05/06/2019 CLINICAL DATA:  Low back pain after fall. EXAM: PELVIS - 1-2 VIEW COMPARISON:   Pelvic and right hip x-rays dated September 08, 2018. FINDINGS: There is no evidence of pelvic fracture or diastasis. Prior right total hip arthroplasty. Unchanged moderate left hip osteoarthritis. IMPRESSION: No acute osseous abnormality. Electronically Signed   By: Titus Dubin M.D.   On: 05/06/2019  19:24   Dg Forearm Right  Result Date: 05/06/2019 CLINICAL DATA:  Right forearm pain after fall. EXAM: RIGHT FOREARM - 2 VIEW COMPARISON:  Right wrist and elbow x-rays dated May 16, 2018. FINDINGS: There is no evidence of fracture or other focal bone lesions. Soft tissues are unremarkable. IMPRESSION: Negative. Electronically Signed   By: Titus Dubin M.D.   On: 05/06/2019 19:13   Dg Abd 1 View  Result Date: 05/15/2019 CLINICAL DATA:  Evaluate for constipation. EXAM: ABDOMEN - 1 VIEW COMPARISON:  CT abdomen pelvis 04/11/2018. FINDINGS: Single AP supine radiograph of the abdomen through the pubic symphysis. The lung bases are excluded from field of view. Gastric distention. There are no dilated loops of bowel to suggest obstruction. Mild stool burden. No supine evidence for free air. Status post right hip arthroplasty, incompletely visualized. IMPRESSION: Gastric distention. Nonobstructive bowel gas pattern. Mild stool burden. Electronically Signed   By: Audie Pinto M.D.   On: 05/15/2019 18:25   Ct Lumbar Spine Wo Contrast  Result Date: 05/22/2019 CLINICAL DATA:  Preop for spine surgery. Mazor protocol. History of fracture through the L1-2 disc space. EXAM: CT LUMBAR SPINE WITHOUT CONTRAST TECHNIQUE: Multidetector CT imaging of the lumbar spine was performed without intravenous contrast administration. Multiplanar CT image reconstructions were also generated. COMPARISON:  CT lumbar spine 05/11/2019 FINDINGS: Segmentation: There are five lumbar type vertebral bodies. The last full intervertebral disc space is labeled L5-S1. This correlates with the prior studies. Alignment: Normal in stable.  Vertebrae: Stable age advanced osteoporosis and severe multilevel facet disease. Stable widening anteriorly at the L1-2 disc space. I do not see any definite disruption of the facets at this level. No new/acute bony findings. Paraspinal and other soft tissues: No significant findings. Disc levels: No interval change since the recent CT scan from 10 days ago. There is multilevel disc disease and severe facet disease with large bridging fusing osteophytes likely changes of DISH. Ankylosing spondylitis is another possibility. Stable wide decompressive laminectomy at L5-S1. IMPRESSION: Stable appearance of the lumbar spine when compared to 2 prior studies from 05/06/2019 and 05/11/2019. Fracture through the L1-2 disc space with anterior widening but no obvious posterior element fractures. Stable underlying changes of ankylosing spondylitis or DISH. Electronically Signed   By: Marijo Sanes M.D.   On: 05/22/2019 11:48   Ct Lumbar Spine Wo Contrast  Result Date: 05/11/2019 CLINICAL DATA:  Lumbar fracture.  Surgical planning. EXAM: CT LUMBAR SPINE WITHOUT CONTRAST TECHNIQUE: Multidetector CT imaging of the lumbar spine was performed without intravenous contrast administration. Multiplanar CT image reconstructions were also generated. COMPARISON:  05/06/2019 FINDINGS: The study is mildly motion degraded despite repeat imaging. Segmentation: 5 lumbar type vertebrae. Alignment: Trace retrolisthesis of L5 on S1. Vertebrae: Diffuse ankylosis is again noted throughout the lumbar and included lower thoracic spine as well as across both SI joints. A transverse fracture is again seen through the L1-2 disc space including across the bridging osteophytes/syndesmophytes. Posterior element extension was better demonstrated on the prior study due to motion and image noise on today's examination. Anterior widening of the L1-2 disc space is unchanged. No new fracture is identified. Paraspinal and other soft tissues: Small pleural  effusions and dependent atelectasis in the lung bases. Aortic atherosclerosis. Partially visualized right hip arthroplasty. Disc levels: Disc space narrowing greatest at L5-S1 where endplate and facet spurring result in severe bilateral neural foraminal stenosis. IMPRESSION: 1. Lumbar spine CT for surgical planning. 2. Findings suggestive of ankylosing spondylitis with fracture through the L1-2  disc space as previously described. Unchanged anterior disc space widening. 3.  Aortic Atherosclerosis (ICD10-I70.0). Electronically Signed   By: Logan Bores M.D.   On: 05/11/2019 14:04   Ct Lumbar Spine Wo Contrast  Result Date: 05/06/2019 CLINICAL DATA:  Tender to palpation over the lower lumbar spine, fall EXAM: CT LUMBAR SPINE WITHOUT CONTRAST TECHNIQUE: Multidetector CT imaging of the lumbar spine was performed without intravenous contrast administration. Multiplanar CT image reconstructions were also generated. COMPARISON:  CT April 11, 2018 FINDINGS: Segmentation: There are 5 non-rib bearing lumbar type vertebral bodies with the last intervertebral disc space labeled as L5-S1. Alignment: Normal Vertebrae: There is a fracture seen through the anterior bridging osteophyte of L1 with widening of the anterior inter disc space which measures 1.6 cm. The fracture extends through the right posterior bridging osteophyte and pedicle as well as through the left-sided pedicle and superior articulating facet. There is also a minimally displaced fracture seen at the right-sided inferior corner of the L1 vertebral body. The fracture also extends through the L1 spinous process. There is minimal right-sided facet joint widening seen at L1-L2. No jumped facets however are noted. A small amount of air seen within the inter disc space at L1-L2. No other definite fracture is identified. There are bridging anterior osteophytes seen through the remainder of the lumbar spine. There inter disc calcifications. There is findings of of  diffuse enthesopathy seen between the spinous processes in the lumbar spine. Paraspinal and other soft tissues: Mild prevertebral soft tissue swelling is seen at L1-L2. No large hematoma however is noted. The sacroiliac joints are intact. There is diffuse ankylosis seen around the bilateral sacroiliac joints. Scattered aortic atherosclerosis is noted. Disc levels: Degenerative changes are seen throughout the lumbar spine with disc height loss and facet arthropathy which is most notable at L5-S1 with severe bilateral neural foraminal narrowing. IMPRESSION: 1. Fracture through the anterior bridging osteophyte of L1-2 with widening of the inter disc space . The fracture extends through the posterior columns at L1, as described above through the pedicles, facet, and spinous process. 2. Mild prevertebral soft tissue swelling is seen at this level. 3. Findings suggestive of ankylosing spondylitis. 4.  Aortic Atherosclerosis (ICD10-I70.0). These results were called by telephone at the time of interpretation on 05/06/2019 at 4:56 pm to provider [facility] , who verbally acknowledged these results. Electronically Signed   By: Prudencio Pair M.D.   On: 05/06/2019 17:02   Dg Chest Port 1 View  Result Date: 05/21/2019 CLINICAL DATA:  Dyspnea and aspiration EXAM: PORTABLE CHEST 1 VIEW COMPARISON:  May 18, 2019 FINDINGS: The mediastinal contour and cardiac silhouette are stable. A feeding tube is identified with distal tip not included on film. Right central venous line is identified with distal tip in the superior vena cava. The heart size is enlarged. Increased pulmonary interstitium is identified bilaterally. There is no focal pneumonia or pleural effusion. The bony structures are stable. IMPRESSION: Cardiomegaly with mild interstitial edema. Electronically Signed   By: Abelardo Diesel M.D.   On: 05/21/2019 12:31   Dg Chest Port 1 View  Result Date: 05/18/2019 CLINICAL DATA:  Pneumonia. EXAM: PORTABLE CHEST 1  VIEW COMPARISON:  May 15, 2019. FINDINGS: Stable cardiomegaly. No pneumothorax or pleural effusion is noted. Right-sided PICC line is noted with distal tip in expected position of cavoatrial junction. No acute pulmonary disease is noted. Bony thorax is unremarkable. IMPRESSION: Right-sided PICC line is been repositioned with tip in expected position of cavoatrial junction. No  acute cardiopulmonary abnormality seen. Electronically Signed   By: Marijo Conception M.D.   On: 05/18/2019 10:52   Dg Chest Port 1 View  Result Date: 05/15/2019 CLINICAL DATA:  68 year old male with a history of PICC placement EXAM: PORTABLE CHEST 1 VIEW COMPARISON:  None. FINDINGS: Cardiomediastinal silhouette unchanged in size and contour with surgical changes of median sternotomy and cardiomegaly. No pneumothorax. No pleural effusion. Coarsened interstitial markings throughout. Right upper extremity PICC is redundant, without visualization of the point of termination of the tip. Redemonstration of partially healed right-sided rib fractures IMPRESSION: Right upper extremity PICC is redundant within the veins in the tip is not visualized. Repositioning is indicated. Similar appearance of the lungs with diffuse reticulonodular opacities. Similar appearance of surgical changes of median sternotomy and cardiomegaly. Electronically Signed   By: Corrie Mckusick D.O.   On: 05/15/2019 11:13   Dg Chest Port 1 View  Result Date: 05/10/2019 CLINICAL DATA:  68 year old male positive for COVID-19. Fever. EXAM: PORTABLE CHEST 1 VIEW COMPARISON:  Portable chest 05/06/2019 and earlier. FINDINGS: Portable AP semi upright view at 1645 hours. Chronic cardiomegaly. Mildly lower lung volumes compared to October. New bilateral basilar predominant confluent and indistinct pulmonary opacity. No superimposed pneumothorax or pleural effusion. Prior sternotomy. No acute osseous abnormality identified. IMPRESSION: 1. New from 05/06/2019 bilateral basilar  predominant bilateral pulmonary opacity compatible with COVID-19 pneumonia. 2. Chronic cardiomegaly. Electronically Signed   By: Genevie Ann M.D.   On: 05/10/2019 17:04   Dg Chest Portable 1 View  Result Date: 05/06/2019 CLINICAL DATA:  Fall. EXAM: PORTABLE CHEST 1 VIEW COMPARISON:  Chest x-ray dated January 13, 2019. FINDINGS: Stable cardiomegaly. Pulmonary vascular congestion. No focal consolidation, pleural effusion, or pneumothorax. No acute osseous abnormality. Old right-sided rib fractures again noted. IMPRESSION: Cardiomegaly with pulmonary vascular congestion. Electronically Signed   By: Titus Dubin M.D.   On: 05/06/2019 19:11   Vas [place] Lower Extremity Venous (dvt)  Result Date: 05/08/2019  Lower Venous Study Indications: Covid positive, elevated D-Dimer. Has unstable lumbar fracture.  Limitations: Patient in severe pain with compression and body habitus. Comparison Study: No prior study on file for comparison Performing Technologist: Sharion Dove RVS  Examination Guidelines: A complete evaluation includes B-mode imaging, spectral Doppler, color Doppler, and power Doppler as needed of all accessible portions of each vessel. Bilateral testing is considered an integral part of a complete examination. Limited examinations for reoccurring indications may be performed as noted.  +---------+---------------+---------+-----------+----------+--------------+  RIGHT     Compressibility Phasicity Spontaneity Properties Thrombus Aging  +---------+---------------+---------+-----------+----------+--------------+  CFV       Full            Yes       Yes                                    +---------+---------------+---------+-----------+----------+--------------+  SFJ       Full                                                             +---------+---------------+---------+-----------+----------+--------------+  FV Prox   Full                                                              +---------+---------------+---------+-----------+----------+--------------+  FV Mid    Full                                                             +---------+---------------+---------+-----------+----------+--------------+  FV Distal Full                                                             +---------+---------------+---------+-----------+----------+--------------+  PFV       Full                                                             +---------+---------------+---------+-----------+----------+--------------+  POP       Full            Yes       Yes                                    +---------+---------------+---------+-----------+----------+--------------+  PTV       Full                                                             +---------+---------------+---------+-----------+----------+--------------+  PERO      Full                                                             +---------+---------------+---------+-----------+----------+--------------+   +---------+---------------+---------+-----------+----------+--------------+  LEFT      Compressibility Phasicity Spontaneity Properties Thrombus Aging  +---------+---------------+---------+-----------+----------+--------------+  CFV       Full            Yes       Yes                                    +---------+---------------+---------+-----------+----------+--------------+  SFJ       Full                                                             +---------+---------------+---------+-----------+----------+--------------+  FV Prox   Full                                                             +---------+---------------+---------+-----------+----------+--------------+  FV Mid    Full                                                             +---------+---------------+---------+-----------+----------+--------------+  FV Distal Full                                                              +---------+---------------+---------+-----------+----------+--------------+  PFV       Full                                                             +---------+---------------+---------+-----------+----------+--------------+  POP       Full            Yes       Yes                                    +---------+---------------+---------+-----------+----------+--------------+  PTV       Full                                                             +---------+---------------+---------+-----------+----------+--------------+  PERO      Full                                                             +---------+---------------+---------+-----------+----------+--------------+     Summary: Right: There is no evidence of deep vein thrombosis in the lower extremity. Left: There is no evidence of deep vein thrombosis in the lower extremity.  *See table(s) above for measurements and observations. Electronically signed by Deitra Mayo MD on 05/08/2019 at 5:56:44 PM.    Final    Ir Picc Placement Right >5 Yrs Inc Img Guide  Result Date: 05/17/2019 INDICATION: BACTEREMIA, ACCESS FOR LONG-TERM ANTIBIOTICS EXAM: ULTRASOUND AND FLUOROSCOPIC GUIDED PICC LINE INSERTION MEDICATIONS: 1% LIDOCAINE LOCAL CONTRAST:  None FLUOROSCOPY TIME:  120 seconds (12 mGy) COMPLICATIONS: None immediate. TECHNIQUE: The procedure, risks, benefits, and alternatives were explained to the patient's family and informed written consent was obtained. A timeout was performed prior to the initiation of the procedure. The right upper extremity was prepped with chlorhexidine in a sterile fashion, and a sterile drape was applied covering the operative field. Maximum barrier sterile technique with sterile gowns and gloves were used for the procedure. A timeout was performed prior to the initiation of the procedure. Local anesthesia was provided with 1% lidocaine. Under direct ultrasound guidance, the right brachial vein was  accessed with a  micropuncture kit after the overlying soft tissues were anesthetized with 1% lidocaine. An ultrasound image was saved for documentation purposes. A guidewire was advanced to the level of the superior caval-atrial junction for measurement purposes and the PICC line was cut to length. A peel-away sheath was placed and a 45 cm, 5 [place], dual lumen was inserted to level of the superior caval-atrial junction. A post procedure spot fluoroscopic was obtained. The catheter easily aspirated and flushed and was sutured in place. A dressing was placed. The patient tolerated the procedure well without immediate post procedural complication. FINDINGS: After catheter placement, the tip lies within the superior cavoatrial junction. The catheter aspirates and flushes normally and is ready for immediate use. IMPRESSION: Successful ultrasound and fluoroscopic guided placement of a right brachial vein approach, 45 cm, 5 French, dual lumen PICC with tip at the superior caval-atrial junction. The PICC line is ready for immediate use. Electronically Signed   By: Jerilynn Mages.  Shick M.D.   On: 05/17/2019 16:35   [place] Ekg Site Rite  Result Date: 05/14/2019 If Site Rite image not attached, placement could not be confirmed due to current cardiac rhythm.    Time Spent in minutes  30     Desiree Hane M.D on 05/24/2019 at 7:41 PM  To page go to [email or web address] - password [email or web address]

## 2019-05-24 NOTE — Progress Notes (Signed)
ANTICOAGULATION CONSULT NOTE  Pharmacy Consult for Heparin Indication: atrial fibrillation   Patient Measurements: Height: 5\' 9"  (175.3 cm) Weight: 246 lb 0.5 oz (111.6 kg) IBW/kg (Calculated) : 70.7 Heparin Dosing Weight: 98 kg  Vital Signs: Temp: 99.5 F (37.5 C) (11/16 0746) Temp Source: Oral (11/16 0746) BP: 136/64 (11/16 0746) Pulse Rate: 80 (11/16 0746)  Labs: Recent Labs    05/22/19 0415 05/23/19 0455 05/23/19 0824 05/24/19 0341  HGB 9.5* 9.2*  --  9.0*  HCT 28.8* 28.9*  --  28.0*  PLT 205 207  --  189  HEPARINUNFRC 0.36 0.37  --  0.33  CREATININE 0.75  --  0.79 0.67     Assessment: 68 year old male on Xarelto prior to admission for atrial fibrillation who presented post fall and was transitioned to Heparin IV due to need for possible lumbar surgery on 11/17.   Heparin level remains therapeutic on current rate of 2350 units/hr.  Hemoglobin remains stable. Platelets are within normal limits.  No bleeding reported. Plan for spinal surgery in AM and Heparin to be held starting at midnight.   Goal of Therapy:  Heparin level 0.3-0.7 units/ml Monitor platelets by anticoagulation protocol: Yes  Plan:  Continue heparin drip at 2350 units/hr - stop time in for midnight.  Follow-up anticoagulation plan post-operatively.   Sloan Leiter, PharmD, BCPS, BCCCP Clinical Pharmacist Please refer to [facility] for [place] numbers 05/24/2019, 1:30 PM

## 2019-05-25 ENCOUNTER — Inpatient Hospital Stay (HOSPITAL_COMMUNITY): Payer: Medicare Other | Admitting: Anesthesiology

## 2019-05-25 ENCOUNTER — Inpatient Hospital Stay (HOSPITAL_COMMUNITY)
Admission: EM | Disposition: A | Discharge status: Skilled Nursing Facility | Payer: Self-pay | Source: Home / Self Care | Attending: Internal Medicine

## 2019-05-25 ENCOUNTER — Inpatient Hospital Stay (HOSPITAL_COMMUNITY): Payer: Medicare Other

## 2019-05-25 DIAGNOSIS — S32002A Unstable burst fracture of unspecified lumbar vertebra, initial encounter for closed fracture: Secondary | ICD-10-CM

## 2019-05-25 DIAGNOSIS — J9602 Acute respiratory failure with hypercapnia: Secondary | ICD-10-CM

## 2019-05-25 HISTORY — PX: LUMBAR PERCUTANEOUS PEDICLE SCREW 4 LEVEL: SHX6318

## 2019-05-25 HISTORY — PX: APPLICATION OF ROBOTIC ASSISTANCE FOR SPINAL PROCEDURE: SHX6753

## 2019-05-25 LAB — POCT I-STAT 7, (LYTES, BLD GAS, ICA,H+H)
Acid-Base Excess: 1 mmol/L (ref 0.0–2.0)
Acid-Base Excess: 2 mmol/L (ref 0.0–2.0)
Bicarbonate: 28.1 mmol/L — ABNORMAL HIGH (ref 20.0–28.0)
Bicarbonate: 27.4 mmol/L (ref 20.0–28.0)
Calcium, Ion: 1.05 mmol/L — ABNORMAL LOW (ref 1.15–1.40)
Calcium, Ion: 1 mmol/L — ABNORMAL LOW (ref 1.15–1.40)
HCT: 28 % — ABNORMAL LOW (ref 39.0–52.0)
HCT: 26 % — ABNORMAL LOW (ref 39.0–52.0)
Hemoglobin: 9.5 g/dL — ABNORMAL LOW (ref 13.0–17.0)
Hemoglobin: 8.8 g/dL — ABNORMAL LOW (ref 13.0–17.0)
O2 Saturation: 100 %
O2 Saturation: 100 %
Patient temperature: 36.5
Patient temperature: 98.3
Potassium: 5.4 mmol/L — ABNORMAL HIGH (ref 3.5–5.1)
Potassium: 5.4 mmol/L — ABNORMAL HIGH (ref 3.5–5.1)
Sodium: 127 mmol/L — ABNORMAL LOW (ref 135–145)
Sodium: 126 mmol/L — ABNORMAL LOW (ref 135–145)
TCO2: 30 mmol/L (ref 22–32)
TCO2: 29 mmol/L (ref 22–32)
pCO2 arterial: 57.5 mmHg — ABNORMAL HIGH (ref 32.0–48.0)
pCO2 arterial: 43.6 mmHg (ref 32.0–48.0)
pH, Arterial: 7.294 — ABNORMAL LOW (ref 7.350–7.450)
pH, Arterial: 7.405 (ref 7.350–7.450)
pO2, Arterial: 227 mmHg — ABNORMAL HIGH (ref 83.0–108.0)
pO2, Arterial: 389 mmHg — ABNORMAL HIGH (ref 83.0–108.0)

## 2019-05-25 LAB — CBC
HCT: 28.5 % — ABNORMAL LOW (ref 39.0–52.0)
HCT: 29.9 % — ABNORMAL LOW (ref 39.0–52.0)
Hemoglobin: 9.1 g/dL — ABNORMAL LOW (ref 13.0–17.0)
Hemoglobin: 9.3 g/dL — ABNORMAL LOW (ref 13.0–17.0)
MCH: 27.6 pg (ref 26.0–34.0)
MCH: 28 pg (ref 26.0–34.0)
MCHC: 31.9 g/dL (ref 30.0–36.0)
MCHC: 31.1 g/dL (ref 30.0–36.0)
MCV: 86.4 fL (ref 80.0–100.0)
MCV: 90.1 fL (ref 80.0–100.0)
Platelets: 189 10*3/uL (ref 150–400)
Platelets: 241 10*3/uL (ref 150–400)
RBC: 3.3 MIL/uL — ABNORMAL LOW (ref 4.22–5.81)
RBC: 3.32 MIL/uL — ABNORMAL LOW (ref 4.22–5.81)
RDW: 16.1 % — ABNORMAL HIGH (ref 11.5–15.5)
RDW: 16.1 % — ABNORMAL HIGH (ref 11.5–15.5)
WBC: 8.3 10*3/uL (ref 4.0–10.5)
WBC: 15.2 10*3/uL — ABNORMAL HIGH (ref 4.0–10.5)
nRBC: 0 % (ref 0.0–0.2)
nRBC: 0 % (ref 0.0–0.2)

## 2019-05-25 LAB — GLUCOSE, CAPILLARY
Glucose-Capillary: 165 mg/dL — ABNORMAL HIGH (ref 70–99)
Glucose-Capillary: 364 mg/dL — ABNORMAL HIGH (ref 70–99)
Glucose-Capillary: 334 mg/dL — ABNORMAL HIGH (ref 70–99)
Glucose-Capillary: 308 mg/dL — ABNORMAL HIGH (ref 70–99)
Glucose-Capillary: 322 mg/dL — ABNORMAL HIGH (ref 70–99)
Glucose-Capillary: 288 mg/dL — ABNORMAL HIGH (ref 70–99)
Glucose-Capillary: 269 mg/dL — ABNORMAL HIGH (ref 70–99)
Glucose-Capillary: 218 mg/dL — ABNORMAL HIGH (ref 70–99)

## 2019-05-25 LAB — BASIC METABOLIC PANEL
Anion gap: 12 (ref 5–15)
BUN: 20 mg/dL (ref 8–23)
CO2: 29 mmol/L (ref 22–32)
Calcium: 8.2 mg/dL — ABNORMAL LOW (ref 8.9–10.3)
Chloride: 88 mmol/L — ABNORMAL LOW (ref 98–111)
Creatinine, Ser: 0.74 mg/dL (ref 0.61–1.24)
GFR calc Af Amer: >60 mL/min (ref >60)
GFR calc non Af Amer: >60 mL/min (ref >60)
Glucose, Bld: 184 mg/dL — ABNORMAL HIGH (ref 70–99)
Potassium: 4.8 mmol/L (ref 3.5–5.1)
Sodium: 129 mmol/L — ABNORMAL LOW (ref 135–145)

## 2019-05-25 LAB — CORTISOL: Cortisol, Plasma: 18.5 ug/dL

## 2019-05-25 LAB — POCT I-STAT, CHEM 8
BUN: 23 mg/dL (ref 8–23)
BUN: 26 mg/dL — ABNORMAL HIGH (ref 8–23)
Calcium, Ion: 1.13 mmol/L — ABNORMAL LOW (ref 1.15–1.40)
Calcium, Ion: 1.07 mmol/L — ABNORMAL LOW (ref 1.15–1.40)
Chloride: 90 mmol/L — ABNORMAL LOW (ref 98–111)
Chloride: 93 mmol/L — ABNORMAL LOW (ref 98–111)
Creatinine, Ser: 0.7 mg/dL (ref 0.61–1.24)
Creatinine, Ser: 0.9 mg/dL (ref 0.61–1.24)
Glucose, Bld: 242 mg/dL — ABNORMAL HIGH (ref 70–99)
Glucose, Bld: 312 mg/dL — ABNORMAL HIGH (ref 70–99)
HCT: 29 % — ABNORMAL LOW (ref 39.0–52.0)
HCT: 29 % — ABNORMAL LOW (ref 39.0–52.0)
Hemoglobin: 9.9 g/dL — ABNORMAL LOW (ref 13.0–17.0)
Hemoglobin: 9.9 g/dL — ABNORMAL LOW (ref 13.0–17.0)
Potassium: 5.5 mmol/L — ABNORMAL HIGH (ref 3.5–5.1)
Potassium: 5.8 mmol/L — ABNORMAL HIGH (ref 3.5–5.1)
Sodium: 125 mmol/L — ABNORMAL LOW (ref 135–145)
Sodium: 127 mmol/L — ABNORMAL LOW (ref 135–145)
TCO2: 27 mmol/L (ref 22–32)
TCO2: 29 mmol/L (ref 22–32)

## 2019-05-25 LAB — CK TOTAL AND CKMB (NOT AT ARMC)
CK, MB: 1.3 ng/mL (ref 0.5–5.0)
Relative Index: 1 (ref 0.0–2.5)
Total CK: 136 U/L (ref 49–397)

## 2019-05-25 LAB — PREPARE RBC (CROSSMATCH)

## 2019-05-25 SURGERY — LUMBAR PERCUTANEOUS PEDICLE SCREW 4 LEVEL
Anesthesia: General | Site: Spine Thoracic

## 2019-05-25 MED ORDER — SODIUM CHLORIDE 0.9 % IV SOLN
INTRAVENOUS | Status: DC
  Administered 2019-05-25: 17:00:00 via INTRAVENOUS

## 2019-05-25 MED ORDER — THROMBIN 5000 UNITS EX SOLR
CUTANEOUS | Status: AC
  Filled 2019-05-25: qty 5000

## 2019-05-25 MED ORDER — LIDOCAINE-EPINEPHRINE 1 %-1:100000 IJ SOLN
INTRAMUSCULAR | Status: AC
  Filled 2019-05-25: qty 1

## 2019-05-25 MED ORDER — SODIUM CHLORIDE 0.9% IV SOLUTION: Freq: Once | INTRAVENOUS | Status: DC

## 2019-05-25 MED ORDER — LIDOCAINE 2% (20 MG/ML) 5 ML SYRINGE
INTRAMUSCULAR | Status: AC
  Filled 2019-05-25: qty 5

## 2019-05-25 MED ORDER — CHLORHEXIDINE GLUCONATE 0.12% ORAL RINSE (MEDLINE KIT)
15.0000 mL | Freq: Two times a day (BID) | OROMUCOSAL | Status: DC
  Administered 2019-05-25 – 2019-05-26 (×2): 15 mL via OROMUCOSAL

## 2019-05-25 MED ORDER — SODIUM CHLORIDE 0.9 % IV SOLN
INTRAVENOUS | Status: DC | PRN
  Administered 2019-05-25 (×2): via INTRAVENOUS

## 2019-05-25 MED ORDER — SUCCINYLCHOLINE CHLORIDE 200 MG/10ML IV SOSY
PREFILLED_SYRINGE | INTRAVENOUS | Status: DC | PRN
  Administered 2019-05-25: 120 mg via INTRAVENOUS

## 2019-05-25 MED ORDER — FENTANYL CITRATE (PF) 250 MCG/5ML IJ SOLN
INTRAMUSCULAR | Status: AC
  Filled 2019-05-25: qty 5

## 2019-05-25 MED ORDER — PHENYLEPHRINE HCL-NACL 10-0.9 MG/250ML-% IV SOLN
INTRAVENOUS | Status: DC | PRN
  Administered 2019-05-25: 10 ug/min via INTRAVENOUS

## 2019-05-25 MED ORDER — MIDAZOLAM HCL 2 MG/2ML IJ SOLN
INTRAMUSCULAR | Status: AC
  Filled 2019-05-25: qty 2

## 2019-05-25 MED ORDER — METOPROLOL TARTRATE 5 MG/5ML IV SOLN
INTRAVENOUS | Status: DC | PRN
  Administered 2019-05-25: 1 mg via INTRAVENOUS
  Administered 2019-05-25: 2 mg via INTRAVENOUS
  Administered 2019-05-25 (×2): 1 mg via INTRAVENOUS

## 2019-05-25 MED ORDER — METOPROLOL TARTRATE 5 MG/5ML IV SOLN
INTRAVENOUS | Status: AC
  Filled 2019-05-25: qty 5

## 2019-05-25 MED ORDER — SODIUM CHLORIDE 0.9 % IV SOLN
INTRAVENOUS | Status: DC | PRN
  Administered 2019-05-25: 10:00:00 via INTRAVENOUS

## 2019-05-25 MED ORDER — THROMBIN 20000 UNITS EX SOLR
CUTANEOUS | Status: AC
  Filled 2019-05-25: qty 20000

## 2019-05-25 MED ORDER — 0.9 % SODIUM CHLORIDE (POUR BTL) OPTIME
TOPICAL | Status: DC | PRN
  Administered 2019-05-25: 1000 mL

## 2019-05-25 MED ORDER — PHENYLEPHRINE 40 MCG/ML (10ML) SYRINGE FOR IV PUSH (FOR BLOOD PRESSURE SUPPORT)
PREFILLED_SYRINGE | INTRAVENOUS | Status: AC
  Filled 2019-05-25: qty 10

## 2019-05-25 MED ORDER — DEXAMETHASONE SODIUM PHOSPHATE 10 MG/ML IJ SOLN
INTRAMUSCULAR | Status: DC | PRN
  Administered 2019-05-25: 10 mg via INTRAVENOUS

## 2019-05-25 MED ORDER — CEFAZOLIN SODIUM-DEXTROSE 2-3 GM-%(50ML) IV SOLR
INTRAVENOUS | Status: DC | PRN
  Administered 2019-05-25: 2 g via INTRAVENOUS

## 2019-05-25 MED ORDER — SODIUM CHLORIDE 0.9 % IV SOLN
250.0000 mL | INTRAVENOUS | Status: DC
  Administered 2019-06-03: 250 mL via INTRAVENOUS

## 2019-05-25 MED ORDER — DEXTROSE 50 % IV SOLN: 0.0000 mL | INTRAVENOUS | Status: DC | PRN

## 2019-05-25 MED ORDER — FENTANYL CITRATE (PF) 100 MCG/2ML IJ SOLN: 25.0000 ug | INTRAMUSCULAR | Status: DC | PRN

## 2019-05-25 MED ORDER — ONDANSETRON HCL 4 MG/2ML IJ SOLN
INTRAMUSCULAR | Status: DC | PRN
  Administered 2019-05-25: 4 mg via INTRAVENOUS

## 2019-05-25 MED ORDER — FENTANYL CITRATE (PF) 250 MCG/5ML IJ SOLN
INTRAMUSCULAR | Status: DC | PRN
  Administered 2019-05-25 (×2): 50 ug via INTRAVENOUS
  Administered 2019-05-25 (×2): 100 ug via INTRAVENOUS

## 2019-05-25 MED ORDER — FENTANYL BOLUS VIA INFUSION
25.0000 ug | INTRAVENOUS | Status: DC | PRN
  Filled 2019-05-25: qty 25

## 2019-05-25 MED ORDER — DEXAMETHASONE SODIUM PHOSPHATE 10 MG/ML IJ SOLN
INTRAMUSCULAR | Status: AC
  Filled 2019-05-25: qty 1

## 2019-05-25 MED ORDER — ORAL CARE MOUTH RINSE
15.0000 mL | OROMUCOSAL | Status: DC
  Administered 2019-05-25 – 2019-05-26 (×10): 15 mL via OROMUCOSAL

## 2019-05-25 MED ORDER — MIDAZOLAM HCL 2 MG/2ML IJ SOLN
1.0000 mg | INTRAMUSCULAR | Status: DC | PRN
  Filled 2019-05-25: qty 2

## 2019-05-25 MED ORDER — LIDOCAINE-EPINEPHRINE 1 %-1:100000 IJ SOLN
INTRAMUSCULAR | Status: DC | PRN
  Administered 2019-05-25: 5 mL

## 2019-05-25 MED ORDER — LACTATED RINGERS IV SOLN
INTRAVENOUS | Status: DC | PRN
  Administered 2019-05-25: 07:00:00 via INTRAVENOUS

## 2019-05-25 MED ORDER — PROPOFOL 10 MG/ML IV BOLUS
INTRAVENOUS | Status: DC | PRN
  Administered 2019-05-25: 50 mg via INTRAVENOUS
  Administered 2019-05-25: 150 mg via INTRAVENOUS

## 2019-05-25 MED ORDER — FENTANYL CITRATE (PF) 100 MCG/2ML IJ SOLN
25.0000 ug | INTRAMUSCULAR | Status: DC | PRN
  Administered 2019-05-25: 50 ug via INTRAVENOUS
  Administered 2019-05-26: 25 ug via INTRAVENOUS

## 2019-05-25 MED ORDER — INSULIN REGULAR(HUMAN) IN NACL 100-0.9 UT/100ML-% IV SOLN
INTRAVENOUS | Status: DC
  Administered 2019-05-25: 23:00:00 11.5 [IU]/h via INTRAVENOUS
  Administered 2019-05-25: 3 [IU]/h via INTRAVENOUS
  Filled 2019-05-25 (×2): qty 100

## 2019-05-25 MED ORDER — BISACODYL 10 MG RE SUPP: 10.0000 mg | Freq: Every day | RECTAL | Status: DC | PRN

## 2019-05-25 MED ORDER — PHENYLEPHRINE 40 MCG/ML (10ML) SYRINGE FOR IV PUSH (FOR BLOOD PRESSURE SUPPORT)
PREFILLED_SYRINGE | INTRAVENOUS | Status: DC | PRN
  Administered 2019-05-25: 80 ug via INTRAVENOUS
  Administered 2019-05-25 (×2): 120 ug via INTRAVENOUS
  Administered 2019-05-25: 80 ug via INTRAVENOUS

## 2019-05-25 MED ORDER — SODIUM CHLORIDE 0.9% FLUSH
3.0000 mL | Freq: Two times a day (BID) | INTRAVENOUS | Status: DC
  Administered 2019-05-25 – 2019-06-17 (×38): 3 mL via INTRAVENOUS

## 2019-05-25 MED ORDER — THROMBIN 20000 UNITS EX SOLR
CUTANEOUS | Status: DC | PRN
  Administered 2019-05-25: 20 mL via TOPICAL

## 2019-05-25 MED ORDER — BUPIVACAINE HCL (PF) 0.5 % IJ SOLN
INTRAMUSCULAR | Status: DC | PRN
  Administered 2019-05-25: 5 mL

## 2019-05-25 MED ORDER — ALBUMIN HUMAN 5 % IV SOLN
INTRAVENOUS | Status: DC | PRN
  Administered 2019-05-25: 10:00:00 via INTRAVENOUS

## 2019-05-25 MED ORDER — CEFAZOLIN SODIUM-DEXTROSE 2-4 GM/100ML-% IV SOLN
2.0000 g | Freq: Three times a day (TID) | INTRAVENOUS | Status: AC
  Administered 2019-05-25 (×2): 2 g via INTRAVENOUS
  Filled 2019-05-25 (×2): qty 100

## 2019-05-25 MED ORDER — CEFAZOLIN SODIUM 1 G IJ SOLR
INTRAMUSCULAR | Status: AC
  Filled 2019-05-25: qty 20

## 2019-05-25 MED ORDER — SODIUM CHLORIDE 0.9% FLUSH
3.0000 mL | INTRAVENOUS | Status: DC | PRN
  Administered 2019-05-30: 3 mL via INTRAVENOUS
  Filled 2019-05-25: qty 3

## 2019-05-25 MED ORDER — FENTANYL CITRATE (PF) 100 MCG/2ML IJ SOLN: 25.0000 ug | Freq: Once | INTRAMUSCULAR | Status: DC

## 2019-05-25 MED ORDER — BACITRACIN ZINC 500 UNIT/GM EX OINT
TOPICAL_OINTMENT | CUTANEOUS | Status: AC
  Filled 2019-05-25: qty 28.35

## 2019-05-25 MED ORDER — PROPOFOL 10 MG/ML IV BOLUS
INTRAVENOUS | Status: AC
  Filled 2019-05-25: qty 40

## 2019-05-25 MED ORDER — ONDANSETRON HCL 4 MG/2ML IJ SOLN
INTRAMUSCULAR | Status: AC
  Filled 2019-05-25: qty 2

## 2019-05-25 MED ORDER — LIDOCAINE 2% (20 MG/ML) 5 ML SYRINGE
INTRAMUSCULAR | Status: DC | PRN
  Administered 2019-05-25: 100 mg via INTRAVENOUS
  Administered 2019-05-25: 40 mg via INTRAVENOUS

## 2019-05-25 MED ORDER — PROPOFOL 500 MG/50ML IV EMUL
INTRAVENOUS | Status: DC | PRN
  Administered 2019-05-25: 30 ug/kg/min via INTRAVENOUS

## 2019-05-25 MED ORDER — FENTANYL 2500MCG IN NS 250ML (10MCG/ML) PREMIX INFUSION
25.0000 ug/h | INTRAVENOUS | Status: DC
  Administered 2019-05-25: 50 ug/h via INTRAVENOUS

## 2019-05-25 MED ORDER — SUCCINYLCHOLINE CHLORIDE 200 MG/10ML IV SOSY
PREFILLED_SYRINGE | INTRAVENOUS | Status: AC
  Filled 2019-05-25: qty 10

## 2019-05-25 MED ORDER — DEXTROSE-NACL 5-0.45 % IV SOLN
INTRAVENOUS | Status: DC
  Administered 2019-05-25: 23:00:00 via INTRAVENOUS

## 2019-05-25 MED ORDER — THROMBIN 5000 UNITS EX SOLR
OROMUCOSAL | Status: DC | PRN
  Administered 2019-05-25 (×2): 5 mL via TOPICAL

## 2019-05-25 MED ORDER — BUPIVACAINE HCL (PF) 0.5 % IJ SOLN
INTRAMUSCULAR | Status: AC
  Filled 2019-05-25: qty 30

## 2019-05-25 MED ORDER — MIDAZOLAM HCL 2 MG/2ML IJ SOLN: 1.0000 mg | INTRAMUSCULAR | Status: DC | PRN

## 2019-05-25 MED ORDER — MIDAZOLAM HCL 5 MG/5ML IJ SOLN
INTRAMUSCULAR | Status: DC | PRN
  Administered 2019-05-25: 2 mg via INTRAVENOUS

## 2019-05-25 MED ORDER — SODIUM CHLORIDE 0.9 % IV SOLN
INTRAVENOUS | Status: DC | PRN
  Administered 2019-05-25: 500 mL

## 2019-05-25 MED ORDER — ROCURONIUM BROMIDE 10 MG/ML (PF) SYRINGE
PREFILLED_SYRINGE | INTRAVENOUS | Status: AC
  Filled 2019-05-25: qty 10

## 2019-05-25 MED ORDER — SENNOSIDES 8.8 MG/5ML PO SYRP: 5.0000 mL | ORAL_SOLUTION | Freq: Two times a day (BID) | ORAL | Status: DC | PRN

## 2019-05-25 MED ORDER — PROPOFOL 1000 MG/100ML IV EMUL
INTRAVENOUS | Status: AC
  Filled 2019-05-25: qty 100

## 2019-05-25 MED ORDER — ROCURONIUM BROMIDE 10 MG/ML (PF) SYRINGE
PREFILLED_SYRINGE | INTRAVENOUS | Status: DC | PRN
  Administered 2019-05-25: 30 mg via INTRAVENOUS
  Administered 2019-05-25: 20 mg via INTRAVENOUS
  Administered 2019-05-25: 100 mg via INTRAVENOUS
  Administered 2019-05-25: 20 mg via INTRAVENOUS
  Administered 2019-05-25: 30 mg via INTRAVENOUS
  Administered 2019-05-25: 80 mg via INTRAVENOUS
  Administered 2019-05-25: 20 mg via INTRAVENOUS

## 2019-05-25 MED ORDER — SODIUM CHLORIDE 0.9 % IV SOLN
INTRAVENOUS | Status: DC | PRN
  Administered 2019-05-25: 19:00:00 1000 mL via INTRAVENOUS

## 2019-05-25 SURGICAL SUPPLY — 96 items
APL SKNCLS STERI-STRIP NONHPOA (GAUZE/BANDAGES/DRESSINGS)
BAG DECANTER FOR FLEXI CONT (MISCELLANEOUS) ×2 IMPLANT
BENZOIN TINCTURE PRP APPL 2/3 (GAUZE/BANDAGES/DRESSINGS) IMPLANT
BIT DRILL LONG 3.0X30 (BIT) IMPLANT
BIT DRILL LONG 3X80 (BIT) IMPLANT
BIT DRILL LONG 4X80 (BIT) IMPLANT
BIT DRILL SHORT 3.0X30 (BIT) IMPLANT
BIT DRILL SHORT 3X80 (BIT) IMPLANT
BLADE CLIPPER SURG (BLADE) IMPLANT
BLADE SURG 11 STRL SS (BLADE) ×3 IMPLANT
BUR MATCHSTICK NEURO 3.0 LAGG (BURR) ×2 IMPLANT
CANISTER SUCT 3000ML PPV (MISCELLANEOUS) ×2 IMPLANT
CARTRIDGE OIL MAESTRO DRILL (MISCELLANEOUS) ×1 IMPLANT
CATH FOLEY 2WAY SLVR  5CC 14FR (CATHETERS)
CATH FOLEY 2WAY SLVR 5CC 14FR (CATHETERS) ×1 IMPLANT
CEMENT BONE KYPHX HV R (Orthopedic Implant) ×1 IMPLANT
CEMENT KYPHON C01A KIT/MIXER (Cement) ×1 IMPLANT
CONT SPEC 4OZ CLIKSEAL STRL BL (MISCELLANEOUS) ×2 IMPLANT
COVER BACK TABLE 24X17X13 BIG (DRAPES) IMPLANT
COVER BACK TABLE 60X90IN (DRAPES) ×2 IMPLANT
COVER WAND RF STERILE (DRAPES) ×2 IMPLANT
DECANTER SPIKE VIAL GLASS SM (MISCELLANEOUS) ×2 IMPLANT
DEVICE BONE FILLER 6550102 2PK (MISCELLANEOUS) ×4 IMPLANT
DIFFUSER DRILL AIR PNEUMATIC (MISCELLANEOUS) ×2 IMPLANT
DRAIN JACKSON PRATT 10MM FLAT (MISCELLANEOUS) ×1 IMPLANT
DRAIN JACKSON RD 7FR 3/32 (WOUND CARE) IMPLANT
DRAPE C-ARM 42X72 X-RAY (DRAPES) ×3 IMPLANT
DRAPE C-ARMOR (DRAPES) ×2 IMPLANT
DRAPE LAPAROTOMY 100X72X124 (DRAPES) ×2 IMPLANT
DRAPE POUCH INSTRU U-SHP 10X18 (DRAPES) ×1 IMPLANT
DRAPE SHEET LG 3/4 BI-LAMINATE (DRAPES) ×2 IMPLANT
DRAPE SURG 17X23 STRL (DRAPES) ×2 IMPLANT
DRSG OPSITE POSTOP 4X10 (GAUZE/BANDAGES/DRESSINGS) ×1 IMPLANT
DURAPREP 26ML APPLICATOR (WOUND CARE) ×2 IMPLANT
ELECT REM PT RETURN 9FT ADLT (ELECTROSURGICAL) ×2
ELECTRODE REM PT RTRN 9FT ADLT (ELECTROSURGICAL) ×1 IMPLANT
EVACUATOR SILICONE 100CC (DRAIN) ×1 IMPLANT
EXTENDER TAB GUIDE SV 5.5/6.0 (INSTRUMENTS) ×24 IMPLANT
Extender ×24 IMPLANT
GAUZE 4X4 16PLY RFD (DISPOSABLE) ×1 IMPLANT
GAUZE SPONGE 4X4 12PLY STRL (GAUZE/BANDAGES/DRESSINGS) IMPLANT
GLOVE BIO SURGEON STRL SZ7 (GLOVE) ×1 IMPLANT
GLOVE BIOGEL PI IND STRL 6.5 (GLOVE) IMPLANT
GLOVE BIOGEL PI IND STRL 7.0 (GLOVE) IMPLANT
GLOVE BIOGEL PI IND STRL 7.5 (GLOVE) ×1 IMPLANT
GLOVE BIOGEL PI INDICATOR 6.5 (GLOVE) ×1
GLOVE BIOGEL PI INDICATOR 7.0 (GLOVE)
GLOVE BIOGEL PI INDICATOR 7.5 (GLOVE) ×2
GLOVE ECLIPSE 7.0 STRL STRAW (GLOVE) ×2 IMPLANT
GLOVE EXAM NITRILE XL STR (GLOVE) IMPLANT
GLOVE SURG SS PI 6.0 STRL IVOR (GLOVE) ×5 IMPLANT
GOWN STRL REUS W/ TWL LRG LVL3 (GOWN DISPOSABLE) ×2 IMPLANT
GOWN STRL REUS W/ TWL XL LVL3 (GOWN DISPOSABLE) IMPLANT
GOWN STRL REUS W/TWL 2XL LVL3 (GOWN DISPOSABLE) IMPLANT
GOWN STRL REUS W/TWL LRG LVL3 (GOWN DISPOSABLE) ×8
GOWN STRL REUS W/TWL XL LVL3 (GOWN DISPOSABLE)
GUIDEWIRE BLUNT NT 450 (WIRE) ×12 IMPLANT
HEMOSTAT POWDER KIT SURGIFOAM (HEMOSTASIS) ×3 IMPLANT
KIT BASIN OR (CUSTOM PROCEDURE TRAY) ×2 IMPLANT
KIT SPINE MAZOR X ROBO DISP (MISCELLANEOUS) ×2 IMPLANT
KIT TURNOVER KIT B (KITS) ×2 IMPLANT
NDL HYPO 18GX1.5 BLUNT FILL (NEEDLE) IMPLANT
NDL HYPO 25X1 1.5 SAFETY (NEEDLE) ×1 IMPLANT
NDL SPNL 18GX3.5 QUINCKE PK (NEEDLE) IMPLANT
NEEDLE HYPO 18GX1.5 BLUNT FILL (NEEDLE) IMPLANT
NEEDLE HYPO 25X1 1.5 SAFETY (NEEDLE) ×2 IMPLANT
NEEDLE SPNL 18GX3.5 QUINCKE PK (NEEDLE) ×4 IMPLANT
NS IRRIG 1000ML POUR BTL (IV SOLUTION) ×2 IMPLANT
OIL CARTRIDGE MAESTRO DRILL (MISCELLANEOUS) ×2
PACK LAMINECTOMY NEURO (CUSTOM PROCEDURE TRAY) ×2 IMPLANT
PAD ARMBOARD 7.5X6 YLW CONV (MISCELLANEOUS) ×6 IMPLANT
PASTE BONE GRAFTON 1CC (Bone Implant) ×1 IMPLANT
PIN HEAD 2.5X60MM (PIN) IMPLANT
ROD PERC STRT 5.5X180 (Rod) ×1 IMPLANT
ROD PERC STRT 5.5X260 (Rod) ×2 IMPLANT
SCREW FENS MAS 6.5X50 (Screw) ×4 IMPLANT
SCREW FENS MAS CCM 7.5X50 (Screw) ×4 IMPLANT
SCREW FENS MAS CCM 7.5X55 (Screw) ×2 IMPLANT
SCREW MAS FENS 6.5 45 (Screw) IMPLANT
SCREW MAS FENS 6.5X45 (Screw) ×4 IMPLANT
SCREW SCHANZ SA 4.0MM (MISCELLANEOUS) IMPLANT
SCREW SET 5.5/6.0MM SOLERA (Screw) ×12 IMPLANT
SPONGE LAP 4X18 RFD (DISPOSABLE) IMPLANT
SPONGE SURGIFOAM ABS GEL 100 (HEMOSTASIS) ×2 IMPLANT
STAPLER VISISTAT 35W (STAPLE) ×2 IMPLANT
STRIP CLOSURE SKIN 1/2X4 (GAUZE/BANDAGES/DRESSINGS) IMPLANT
SUT VIC AB 0 CT1 18XCR BRD8 (SUTURE) ×2 IMPLANT
SUT VIC AB 0 CT1 8-18 (SUTURE) ×8
SUT VIC AB 2-0 CT1 18 (SUTURE) ×1 IMPLANT
SUT VICRYL 3-0 RB1 18 ABS (SUTURE) ×2 IMPLANT
SYR 3ML LL SCALE MARK (SYRINGE) IMPLANT
TIP FENESTRATED 6550202 2PK (MISCELLANEOUS) ×4 IMPLANT
TOWEL GREEN STERILE (TOWEL DISPOSABLE) ×2 IMPLANT
TOWEL GREEN STERILE FF (TOWEL DISPOSABLE) ×2 IMPLANT
TUBE MAZOR SA REDUCTION (TUBING) ×2 IMPLANT
WATER STERILE IRR 1000ML POUR (IV SOLUTION) ×2 IMPLANT

## 2019-05-25 NOTE — Progress Notes (Signed)
Pt arrived to 4 No 29 on vent.

## 2019-05-25 NOTE — Op Note (Addendum)
NEUROSURGERY OPERATIVE NOTE   PREOP DIAGNOSIS:  1. L1-L2 Chance Fracture   POSTOP DIAGNOSIS: Same  PROCEDURE: 1. Open reduction, internal fixation L1-2 Chance fracture 2. Posterior segmental instrumented pedicle screw stabilization, T11-L4 3. Posterolateral arthrodesis, T11-L4 4. Use of non-structural morcellized bone allograft - DBX  SURGEON: Dr. Lisbeth Renshaw, MD  ASSISTANT: Cindra Presume, PA-C  ANESTHESIA: General Endotracheal  EBL: 50cc  SPECIMENS: None  DRAINS: None  COMPLICATIONS: None  CONDITION: Guarded condition to ICU  HISTORY: Cody Hale is a 68 y.o. male initially presented to the hospital nearly 3 weeks ago after a fall.  CT scan demonstrated unstable L1-2 chance type fracture.  He has significant medical comorbidities including COPD, CAD, CHF, history of mitral valve replacement for endocarditis, A. fib, hypertension, diabetes, and positive for COVID-19.  After he was medically stabilized, he presents for operative stabilization and fusion.  The risks and benefits of the operative procedure were reviewed in detail with the patient's son.  After all questions were answered informed consent was obtained and witnessed.  PROCEDURE IN DETAIL: The patient was brought to the operating room. After induction of general anesthesia, the patient was positioned on the operative table in the prone position. All pressure points were meticulously padded. Skin incision was then marked out and prepped and draped in the usual sterile fashion.  After timeout was conducted, a Schanz pin was introduced into the right posterior superior iliac spine.  The Mazor robot was then attached to the Schanz pin.  Oblique fluoroscopic images were taken and registered with the preoperative stereotactic CT scan.  Initially good accuracy was achieved for the upper levels.  The action of the T11 and T12 pedicles were then marked out.  Midline skin incision was then infiltrated with local  anesthetic with epinephrine.  Incision was then made sharply and carried down through the subcutaneous tissue until the thoracodorsal fascia was identified.  The skin was then undermined just above the level of the fascia.  Utilizing the robotic system, the T11 and T12 pedicles were accessed and K wires were placed.  Position was confirmed with AP and lateral fluoroscopy.  At this point, as attention was turned to the lower levels.  In a similar fashion, oblique x-rays were then taken to achieve satisfactory registration of the L1-L4 levels.  Skin incision was then carried inferiorly and again the skin was undermined just superficial to the lumbodorsal fascia.  The L1-L4 pedicles were then sequentially cannulated and K wires placed.  Position of the K wires was then confirmed again with AP and lateral fluoroscopy.  At this point, 6.5 millimeters screws were placed at the upper 2 levels, and seven 5.5 millimeter screws were placed at the lower 4 levels.  The upper 2 and lower 2 levels which we plan to augment the screws with cement were tapped to the depth of the planned screw placement.  Once the screws were placed, the cement towers were placed onto the T11, T12, L3, and L4 screw towers.  PMMA cement was then injected into all 4 levels bilaterally under fluoroscopic guidance.  There was no appearance of cement extravasation into the disc space, or posteriorly into the canal.  At this point a rod was sized to approximately 195 mm and a slight lordotic band was placed to accommodate the patient's anatomy.  Rods were then passed through the tulips bilaterally.  Setscrews were then placed and final tightened.  At this point the exposed bone visible through the multiple fascial incisions including portions  of the laminae and facet complexes at T11, T12, L1, L2, L3, and L4 bilaterally were decorticated with the high-speed drill. DBX was then placed over the exposed decorticated bony surfaces to promote  posterolateral arthrodesis from T11 through L4.  Final AP and lateral fluoroscopic images demonstrated good position of the hardware and good thoracolumbar alignment.  At this point, the fascial incisions were closed with interrupted 0 Vicryl stitches.  A flat drain was then placed and tunneled subcutaneously.  The subcutaneous layer was closed with interrupted 0 Vicryl stitches and the skin was closed with staples.  The incision for the Schanz pin was also closed with interrupted 3-0 Vicryl stitches.  Bacitracin ointment and sterile dressings were applied.  The patient was then transferred to the bed and taken to the intensive care unit in guarded condition.

## 2019-05-25 NOTE — Progress Notes (Signed)
PT Cancellation Note  Patient Details Name: Cody Hale MRN: [id number] DOB: [birth year]   Cancelled Treatment:    Reason Eval/Treat Not Completed: Patient not medically ready.  Pt unable to extubate post surgery.  Will hold today. 05/25/2019  Donnella Sham, [place] [phone]  (pager) [phone]  (office)   [name] 05/25/2019, 4:26 PM

## 2019-05-25 NOTE — Progress Notes (Signed)
Dr Lynetta Mare aware of sodium 129/126. (chem and gas)

## 2019-05-25 NOTE — Anesthesia Postprocedure Evaluation (Signed)
Anesthesia Post Note  Patient: Cody Hale  Procedure(s) Performed: Thoracic Eleven- Lumbar Four Instrumented Fusion (N/A Spine Thoracic) APPLICATION OF ROBOTIC ASSISTANCE FOR SPINAL PROCEDURE (N/A Spine Thoracic)     Patient location during evaluation: SICU Anesthesia Type: General Level of consciousness: sedated Pain management: pain level controlled Vital Signs Assessment: post-procedure vital signs reviewed and stable Respiratory status: patient remains intubated per anesthesia plan Cardiovascular status: stable Postop Assessment: no apparent nausea or vomiting Anesthetic complications: no    Last Vitals:  Vitals:   05/25/19 1400 05/25/19 1415  BP:    Pulse: 63 62  Resp: 16 14  Temp:    SpO2: 100% 100%    Last Pain:  Vitals:   05/24/19 2045  TempSrc:   PainSc: 0-No pain                 Lidia Collum

## 2019-05-25 NOTE — Anesthesia Procedure Notes (Signed)
Procedure Name: Intubation Date/Time: 05/25/2019 8:08 AM Performed by: Renato Shin, CRNA Pre-anesthesia Checklist: Patient identified, Emergency Drugs available, Suction available and Patient being monitored Patient Re-evaluated:Patient Re-evaluated prior to induction Oxygen Delivery Method: Circle system utilized Preoxygenation: Pre-oxygenation with 100% oxygen Induction Type: IV induction and Rapid sequence Laryngoscope Size: Glidescope and 4 Grade View: Grade I Tube type: Oral Tube size: 7.5 mm Number of attempts: 1 Airway Equipment and Method: Stylet and Oral airway Placement Confirmation: ETT inserted through vocal cords under direct vision,  positive ETCO2 and breath sounds checked- equal and bilateral Secured at: 22 cm Tube secured with: Tape Dental Injury: Teeth and Oropharynx as per pre-operative assessment

## 2019-05-25 NOTE — OR Nursing (Signed)
It was noted during case that foley bag had small hole. Due to patient having respiratory issues only the foley bag was changed. Bridgette Wolden RN.

## 2019-05-25 NOTE — Progress Notes (Signed)
OT Cancellation Note  Patient Details Name: Mandell Pangborn MRN: [id number] DOB: 1950-09-15   Cancelled Treatment:    Reason Eval/Treat Not Completed: Patient not medically ready RN requesting hold for therapy this date, remains intubated. Not medically ready. Will continue to follow as available and appropriate to initiate OT POC.  Zenovia Jarred, MSOT, OTR/L Behavioral Health OT/ Acute Relief OT [facility] Office: [place] 05/25/2019, 4:24 PM

## 2019-05-25 NOTE — Transfer of Care (Signed)
Immediate Anesthesia Transfer of Care Note  Patient: Cody Hale  Procedure(s) Performed: Thoracic Eleven- Lumbar Four Instrumented Fusion (N/A Spine Thoracic) APPLICATION OF ROBOTIC ASSISTANCE FOR SPINAL PROCEDURE (N/A Spine Thoracic)  Patient Location: ICU  Anesthesia Type:General  Level of Consciousness: sedated and Patient remains intubated per anesthesia plan  Airway & Oxygen Therapy: Patient remains intubated per anesthesia plan and Patient placed on Ventilator (see vital sign flow sheet for setting)  Post-op Assessment: Report given to RN and Post -op Vital signs reviewed and stable  Post vital signs: Reviewed and stable  Last Vitals:  Vitals Value Taken Time  BP 114/64 05/25/19 1425  Temp    Pulse 62 05/25/19 1430  Resp 15 05/25/19 1430  SpO2 100 % 05/25/19 1430  Vitals shown include unvalidated device data.  Last Pain:  Vitals:   05/24/19 2045  TempSrc:   PainSc: 0-No pain      Patients Stated Pain Goal: 1 (16/10/96 0454)  Complications: No apparent anesthesia complications

## 2019-05-25 NOTE — Progress Notes (Signed)
  NEUROSURGERY PROGRESS NOTE   No issues overnight.   EXAM:  BP (!) 117/53 (BP Location: Left Arm)   Pulse 81   Temp 98.3 F (36.8 C)   Resp 20   Ht 5\' 9"  (1.753 m)   Wt 114.3 kg   SpO2 94%   BMI 37.21 kg/m   Remains very somnolent Follows commands  IMPRESSION:  68 y.o. 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  PLAN: - OR today for T11-L4 stabilization of fracture  I reviewed the situation with the patient's son by phone. We discussed the unstable nature of the fracture which would normally be stabilized surgically. We discussed risks of surgery and general risks of anesthesia given his poor medical condition. We also discussed the expected postoperative course and overall prognosis. All his questions were answered and he provided verbal consent to proceed.

## 2019-05-25 NOTE — Progress Notes (Signed)
Attempted to see patient earlier today, however patient was already in surgery. Now on ventilator in ICU. PCCM is currently taking over care. TRH will sign off at this time. Please re-consult [place] when we are needed. Thanks.  Cordelia Poche, MD [facility] 05/25/2019, 2:04 PM

## 2019-05-25 NOTE — Progress Notes (Signed)
First fentanyl gtt bag leaking, wasted entire contents with TK Vallarie Mare and Vic Ripper RN.

## 2019-05-25 NOTE — Progress Notes (Signed)
Orthopedic Tech Progress Note Patient Details:  Cody Hale 03-11-1951 [id number] Called in order to [facility] for a TLSO/ [place]  Patient ID: Cody Hale, male   DOB: November 03, 1950, 68 y.o.   MRN: [id number]   Janit Pagan 05/25/2019, 2:10 PM

## 2019-05-25 NOTE — Progress Notes (Addendum)
Need sedation, analgesia orders, CCM notified. Hospitalist has signed off.

## 2019-05-25 NOTE — Progress Notes (Signed)
Per sending RN and central tele patient had ST elevation in MCL. Dr. Christella Hartigan aware. EKG ordered. Additional lab work ordered. Vida Roller, CRNA at bedside. EKG shown to Dr. Kerin Perna after obtaining EKG.

## 2019-05-25 NOTE — Progress Notes (Signed)
VO received for fentanyl gtt, Agarwala entering in computer, gtt started at 50 mcg

## 2019-05-25 NOTE — Anesthesia Procedure Notes (Signed)
Arterial Line Insertion Start/End11/17/2020 7:15 AM, 05/25/2019 7:20 AM Performed by: Renato Shin, CRNA, CRNA  Patient location: Pre-op. Preanesthetic checklist: patient identified, IV checked, site marked, risks and benefits discussed, surgical consent, monitors and equipment checked, pre-op evaluation, timeout performed and anesthesia consent Lidocaine 1% used for infiltration Left, radial was placed Catheter size: 20 G Hand hygiene performed , maximum sterile barriers used  and Seldinger technique used Allen's test indicative of satisfactory collateral circulation Attempts: 1 Procedure performed without using ultrasound guided technique. Following insertion, dressing applied and Biopatch. Post procedure assessment: normal  Patient tolerated the procedure well with no immediate complications.

## 2019-05-25 NOTE — Progress Notes (Signed)
NAMEDona Hale, MRN:  [id number], DOB:  06/16/1951, LOS: 19 ADMISSION DATE:  05/06/2019, CONSULTATION DATE: 05/25/2019 REFERRING MD: Cody Hale-neurosurgery, CHIEF COMPLAINT: Respiratory failure  Brief History   68 year old man who underwent multilevel spinal fusion today.  Difficult to ventilate in the operating room so was kept intubated and admitted to the intensive care unit for ongoing management.  History of present illness   11/17 underwent open reduction internal fixation of L1-L2 Chance fracture with multilevel spine stabilization from T11-L4. In prone position for most of the surgery, difficult to ventilate requiring respiratory rate of 25 x 8 mL/kg to achieve an end-tidal CO2 of 52.  pH was acidotic.  No improvement with bronchodilators. Possible ST-T wave changes intraoperatively with no change in position.  Minimal urine output.  Extensive past medical history with complicated recent past: He presented 10/29 with back pain after mechanical fall and was found to have an unstable lumbar fracture.  Course in hospital complicated due to asymptomatic COVID-19 infection and GBS bacteremia.  Both have resolved and he was brought to surgery today.  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  Past Medical History   Past Medical History:  Diagnosis Date  . A-fib (HCC)   . Anxiety   . Atrial fibrillation (HCC)   . Cellulitis   . CHF (congestive heart failure) (HCC)   . Chronic venous insufficiency   . COPD (chronic obstructive pulmonary disease) (HCC)   . Coronary atherosclerosis of native coronary artery    Mild nonobstructive 08/2012  . Degenerative joint disease   . Diabetes mellitus, type II (HCC)    Gastroparesis; GI care at [facility]  . 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 s/p zyvox rx.  (Cultures never positive); left bundle  branch block; H/o SVT; 09/2012: bioprosthetic MVR at [facility]; a. Severe dental caries and cavities s/p multiple extractions.  . Essential hypertension, benign   . Gastroparesis   . 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 Left bundle branch block   . Left leg cellulitis   . Major depressive disorder, recurrent severe without psychotic features (HCC)    BH admission 12/2012  . Morbid obesity (HCC) 06/27/2012  . Nephrolithiasis   . PSVT (paroxysmal supraventricular tachycardia) (HCC)    Post-op at [facility]  . Sleep apnea    a. uses CPAP nightly  . Urinary tract infection 08/30/2012   Proteus mirabilis    Significant Hospital Events   10/29 admitted for back pain  10/29 positive Covid test-asymptomatic GBS bacteremia, TEE negative for endocarditis.  Completed antibiotics on 1116 Mild exacerbation of COPD requiring diuresis 11/12 Multilevel spinal stabilization 11/17. Consults:  Neurosurgery Critical care  Procedures:  11/17: PROCEDURE: 1. Open reduction, internal fixation L1-2 Chance fracture 2. Posterior segmental instrumented pedicle screw stabilization, T11-L4 3. Posterolateral arthrodesis, T11-L4 Use of non-structural morcellized bone allograft - DBX  Significant Diagnostic Tests:    Micro Data:  11/2-group G Streptococcus in blood.  Antimicrobials:  Penicillin G 11/3-11/17  Interim history/subjective:  Admitted on propofol from operating room.  No surgical complications per Dr. Conchita Paris.  Blood loss within expected range.  Objective   Blood pressure 114/64, pulse 68, temperature 98.3 F (36.8 C), resp. rate 16, height 5\' 9"  (1.753 m), weight 114.3 kg, SpO2 100 %.    Vent Mode: PRVC FiO2 (%):  [100 %] 100 % Set Rate:  [14 bmp] 14  bmp Vt Set:  [560 mL] 560 mL PEEP:  [5 cmH20] 5 cmH20 Plateau Pressure:  [20 cmH20] 20 cmH20   Intake/Output Summary (Last 24 hours) at 05/25/2019 1505 Last data filed at 05/25/2019 1312 Gross per 24  hour  Intake 4694.03 ml  Output 2525 ml  Net 2169.03 ml   Filed Weights   05/22/19 0300 05/23/19 0300 05/25/19 0328  Weight: 111.4 kg 111.6 kg 114.3 kg    Examination: General: Mild obesity, appears mildly icteric HENT: No scleral icterus, ET tube in place. Lungs: Normal chest excursion.  Occasional wheezing.  Tolerating pressure support ventilation. Cardiovascular: Warm extremities.  Heart sounds unremarkable. Abdomen: Abdomen soft and nontender. Extremities: No peripheral edema.  Occasional cherry red spots on the chest. Neuro: Opens eyes to voice and painful stimuli.  No limb movement at this time.  On fentanyl infusion.  Jackson-Pratt with serosanguineous drainage. GU: Foley catheter in place.  Resolved Hospital Problem list   Group B strep bacteremia  Assessment & Plan:  Critically ill due to hypercarbic respiratory failure prior mechanical ventilation. This represents high dead space ventilation Suspect this was due to restrictive chest wall defect from ankylosing spondylitis superimposed on COPD and exacerbated by prone positioning. ABG supine postop is acceptable. -Proceed to SBT and possible extubation.  Status post multilevel spinal surgery. -Measure for back brace -PT OT and mobilization once extubated as per usual spine surgery protocol. -Provide adequate pain control as upper back incision may limit breathing.  Atrial fibrillation on Xarelto at home. -Resume IV heparin tomorrow.  COPD -Continue bronchodilators with DuoNeb. -Resume home regimen once extubated.  Chronic mixed heart failure No sign of decompensation at this time -Resume home medication once extubated.  Ankylosing spondylitis Spinal spinal precautions necessary if need reintubation Potential difficult airway.   Daily Goals Checklist  Pain/Anxiety/Delirium protocol (if indicated): Fentanyl infusion with Versed as needed VAP protocol (if indicated): Bundle in place Respiratory support  goals: Proceed to SBT, extubate if passes 1 hour SBT Blood pressure target: Less than 623 systolic DVT prophylaxis: SCDs for now, resume IV heparin tomorrow Nutritional status and feeding goals: N.p.o. swallow evaluation and enteral nutrition post extubation GI prophylaxis: Pantoprazole Fluid status goals: Allow autoregulation for now. Urinary catheter: Assessment of intravascular volume Central lines: Peripheral IVs only Glucose control: Phase 1 glycemic control Mobility/therapy needs: PT as per stroke rehabilitation Antibiotic de-escalation: Perioperative antibiotics only Home medication reconciliation: We will reconcile medications once extubated Daily labs: CBC and BMP daily Code Status: Full code Family Communication: Per neurosurgery, will update family as well. Disposition: ICU.  Plan to extubate today.   Labs   CBC: Recent Labs  Lab 05/22/19 0415 05/23/19 0455 05/24/19 0341 05/25/19 0415 05/25/19 1231  WBC 9.6 9.5 9.4 8.3 15.2*  HGB 9.5* 9.2* 9.0* 9.1* 9.3*  HCT 28.8* 28.9* 28.0* 28.5* 29.9*  MCV 86.2 87.6 85.6 86.4 90.1  PLT 205 207 189 189 762    Basic Metabolic Panel: Recent Labs  Lab 05/22/19 0415 05/23/19 0824 05/24/19 0341 05/24/19 1808 05/25/19 0415  NA 127* 127* 126* 128* 129*  K 4.8 5.1 4.9 5.1 4.8  CL 91* 89* 87* 87* 88*  CO2 26 27 29 29 29   GLUCOSE 199* 300* 260* 292* 184*  BUN 18 20 19 19 20   CREATININE 0.75 0.79 0.67 0.84 0.74  CALCIUM 7.8* 7.9* 7.9* 8.0* 8.2*   GFR: Estimated Creatinine Clearance: 110.1 mL/min (by C-G formula based on SCr of 0.74 mg/dL). Recent Labs  Lab 05/23/19 0455 05/24/19 0341  05/25/19 0415 05/25/19 1231  WBC 9.5 9.4 8.3 15.2*    Liver Function Tests: Recent Labs  Lab 05/19/19 0942  AST 41  ALT 19  ALKPHOS 65  BILITOT 1.2  PROT 5.4*  ALBUMIN 1.7*   No results for input(s): LIPASE, AMYLASE in the last 168 hours. No results for input(s): AMMONIA in the last 168 hours.  ABG    Component Value  Date/Time   PHART 7.545 (H) 05/23/2018 0743   PCO2ART 41.7 05/23/2018 0743   PO2ART 186 (H) 05/23/2018 0743   HCO3 36.0 (H) 05/23/2018 0743   TCO2 26 03/02/2018 1837   ACIDBASEDEF 1.7 03/02/2018 2230   O2SAT 99.6 05/23/2018 0743     Coagulation Profile: No results for input(s): INR, PROTIME in the last 168 hours.  Cardiac Enzymes: Recent Labs  Lab 05/25/19 1323  CKTOTAL 136  CKMB 1.3    HbA1C: Hgb A1c MFr Bld  Date/Time Value Ref Range Status  03/24/2019 02:13 PM 7.4 (H) 4.6 - 6.5 % Final    Comment:    Glycemic Control Guidelines for People with Diabetes:Non Diabetic:  <6%Goal of Therapy: <7%Additional Action Suggested:  >8%   12/31/2018 12:19 PM 8.3 (H) 4.6 - 6.5 % Final    Comment:    Glycemic Control Guidelines for People with Diabetes:Non Diabetic:  <6%Goal of Therapy: <7%Additional Action Suggested:  >8%     CBG: Recent Labs  Lab 05/24/19 1204 05/24/19 1711 05/24/19 1920 05/24/19 2317 05/25/19 0325  GLUCAP 284* 274* 267* 277* 165*    CRITICAL CARE Performed by: Lynnell Catalan   Total critical care time: 45 minutes  Critical care time was exclusive of separately billable procedures and treating other patients.  Critical care was necessary to treat or prevent imminent or life-threatening deterioration.  Critical care was time spent personally by me on the following activities: development of treatment plan with patient and/or surrogate as well as nursing, discussions with consultants, evaluation of patient's response to treatment, examination of patient, obtaining history from patient or surrogate, ordering and performing treatments and interventions, ordering and review of laboratory studies, ordering and review of radiographic studies, pulse oximetry, re-evaluation of patient's condition and participation in multidisciplinary rounds.  Lynnell Catalan, MD [facility] ICU Physician [facility] [place] Critical Care  Pager: [phone] Mobile: [phone] After hours:  [phone].

## 2019-05-26 DIAGNOSIS — J9602 Acute respiratory failure with hypercapnia: Secondary | ICD-10-CM | POA: Diagnosis not present

## 2019-05-26 LAB — CBC
HCT: 27.3 % — ABNORMAL LOW (ref 39.0–52.0)
Hemoglobin: 8.5 g/dL — ABNORMAL LOW (ref 13.0–17.0)
MCH: 27.7 pg (ref 26.0–34.0)
MCHC: 31.1 g/dL (ref 30.0–36.0)
MCV: 88.9 fL (ref 80.0–100.0)
Platelets: 169 10*3/uL (ref 150–400)
RBC: 3.07 MIL/uL — ABNORMAL LOW (ref 4.22–5.81)
RDW: 16.3 % — ABNORMAL HIGH (ref 11.5–15.5)
WBC: 11.9 10*3/uL — ABNORMAL HIGH (ref 4.0–10.5)
nRBC: 0 % (ref 0.0–0.2)

## 2019-05-26 LAB — BASIC METABOLIC PANEL
Anion gap: 10 (ref 5–15)
BUN: 20 mg/dL (ref 8–23)
CO2: 24 mmol/L (ref 22–32)
Calcium: 7.8 mg/dL — ABNORMAL LOW (ref 8.9–10.3)
Chloride: 96 mmol/L — ABNORMAL LOW (ref 98–111)
Creatinine, Ser: 0.6 mg/dL — ABNORMAL LOW (ref 0.61–1.24)
GFR calc Af Amer: >60 mL/min (ref >60)
GFR calc non Af Amer: >60 mL/min (ref >60)
Glucose, Bld: 140 mg/dL — ABNORMAL HIGH (ref 70–99)
Potassium: 4.7 mmol/L (ref 3.5–5.1)
Sodium: 130 mmol/L — ABNORMAL LOW (ref 135–145)

## 2019-05-26 LAB — GLUCOSE, CAPILLARY
Glucose-Capillary: 144 mg/dL — ABNORMAL HIGH (ref 70–99)
Glucose-Capillary: 147 mg/dL — ABNORMAL HIGH (ref 70–99)
Glucose-Capillary: 151 mg/dL — ABNORMAL HIGH (ref 70–99)
Glucose-Capillary: 157 mg/dL — ABNORMAL HIGH (ref 70–99)
Glucose-Capillary: 166 mg/dL — ABNORMAL HIGH (ref 70–99)
Glucose-Capillary: 174 mg/dL — ABNORMAL HIGH (ref 70–99)
Glucose-Capillary: 179 mg/dL — ABNORMAL HIGH (ref 70–99)
Glucose-Capillary: 194 mg/dL — ABNORMAL HIGH (ref 70–99)
Glucose-Capillary: 199 mg/dL — ABNORMAL HIGH (ref 70–99)
Glucose-Capillary: 321 mg/dL — ABNORMAL HIGH (ref 70–99)

## 2019-05-26 MED ORDER — CHLORHEXIDINE GLUCONATE 0.12 % MT SOLN
15.0000 mL | Freq: Two times a day (BID) | OROMUCOSAL | Status: DC
  Administered 2019-05-26 – 2019-06-18 (×46): 15 mL via OROMUCOSAL
  Filled 2019-05-26 (×33): qty 15

## 2019-05-26 MED ORDER — ORAL CARE MOUTH RINSE
15.0000 mL | Freq: Two times a day (BID) | OROMUCOSAL | Status: DC
  Administered 2019-05-27 – 2019-06-17 (×44): 15 mL via OROMUCOSAL

## 2019-05-26 MED ORDER — INSULIN DETEMIR 100 UNIT/ML ~~LOC~~ SOLN
26.0000 [IU] | Freq: Two times a day (BID) | SUBCUTANEOUS | Status: DC
  Administered 2019-05-26 (×2): 26 [IU] via SUBCUTANEOUS
  Filled 2019-05-26 (×3): qty 0.26

## 2019-05-26 MED ORDER — FUROSEMIDE 10 MG/ML IJ SOLN
80.0000 mg | Freq: Two times a day (BID) | INTRAMUSCULAR | Status: DC
  Administered 2019-05-26 – 2019-05-27 (×2): 80 mg via INTRAVENOUS
  Filled 2019-05-26 (×3): qty 8

## 2019-05-26 MED ORDER — MORPHINE SULFATE (PF) 2 MG/ML IV SOLN
1.0000 mg | INTRAVENOUS | Status: DC | PRN
  Administered 2019-05-26 – 2019-05-27 (×2): 1 mg via INTRAVENOUS
  Filled 2019-05-26 (×2): qty 1

## 2019-05-26 MED ORDER — DOPAMINE-DEXTROSE 3.2-5 MG/ML-% IV SOLN
5.0000 ug/kg/min | INTRAVENOUS | Status: DC
  Administered 2019-05-26: 2.5 ug/kg/min via INTRAVENOUS
  Filled 2019-05-26: qty 250

## 2019-05-26 MED ORDER — PRO-STAT SUGAR FREE PO LIQD
30.0000 mL | Freq: Three times a day (TID) | ORAL | Status: DC
  Filled 2019-05-26: qty 30

## 2019-05-26 MED ORDER — OXYCODONE HCL 5 MG PO TABS
5.0000 mg | ORAL_TABLET | ORAL | Status: DC | PRN
  Administered 2019-05-26: 5 mg
  Administered 2019-05-27: 10 mg
  Filled 2019-05-26: qty 2
  Filled 2019-05-26 (×2): qty 1

## 2019-05-26 MED ORDER — JEVITY 1.2 CAL PO LIQD
1000.0000 mL | ORAL | Status: DC
  Filled 2019-05-26: qty 1000

## 2019-05-26 MED ORDER — PIVOT 1.5 CAL PO LIQD
1000.0000 mL | ORAL | Status: DC
  Administered 2019-05-26 – 2019-06-02 (×7): 1000 mL
  Filled 2019-05-26 (×8): qty 1000

## 2019-05-26 MED ORDER — INSULIN ASPART 100 UNIT/ML ~~LOC~~ SOLN
2.0000 [IU] | SUBCUTANEOUS | Status: DC
  Administered 2019-05-26 (×2): 4 [IU] via SUBCUTANEOUS
  Administered 2019-05-26 (×2): 2 [IU] via SUBCUTANEOUS
  Administered 2019-05-26: 13:00:00 4 [IU] via SUBCUTANEOUS

## 2019-05-26 MED FILL — Thrombin For Soln 5000 Unit: CUTANEOUS | Qty: 5000 | Status: AC

## 2019-05-26 MED FILL — Gelatin Absorbable MT Powder: OROMUCOSAL | Qty: 1 | Status: AC

## 2019-05-26 NOTE — Procedures (Signed)
Extubation Procedure Note  Patient Details:   Name: Cody Hale DOB: [birth year] MRN: [id number]   Airway Documentation:    Vent end date: 05/26/19 Vent end time: 0934   Evaluation  O2 sats: stable throughout Complications: No apparent complications Patient did tolerate procedure well. Bilateral Breath Sounds: Diminished, Rhonchi   Yes   Patient extubated per order to 4L [place] with no apparent complications. Positive cuff leak was noted prior to extubation. Patient is alert and oriented and is able to speak with a strong voice. Vitals are stable. RT will continue to monitor.   Abdias Hickam Clyda Greener 05/26/2019, 9:42 AM

## 2019-05-26 NOTE — Progress Notes (Signed)
Initial Nutrition Assessment  DOCUMENTATION CODES:   Obesity unspecified  INTERVENTION:  -Pivot 1.5 @ 30 ml/hr, advance 10 ml every 4 hrs to goal rate of 60 ml/hr (1440 ml/day) - Provides 2160 kcal, 135 grams protein, 1094 ml free H20 daily -Continue MVI with minerals daily, crushed via tube -Discontinue Pro-stat  NUTRITION DIAGNOSIS:   Increased nutrient needs related to wound healing, acute illness as evidenced by estimated needs. Ongoing   GOAL:   Patient will meet greater than or equal to 90% of their needs Progressing  MONITOR:   PO intake, Supplement acceptance, Labs, Weight trends, I & O's, Skin  REASON FOR ASSESSMENT:   Consult Enteral/tube feeding initiation and management  ASSESSMENT:   68 y.o. 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  Patient fell onto his buttock.Patient is being admitted for further management of lumbar fracture.  10/29 admit, Covid+ 11/13 Cortrak tube placed 11/17 ORIF of L1-L2; multilevel spine stabilization from T11-L4 11/18 Extubated  Per chart review, plans to continue with tube feeds; patient with preexisting swallowing difficulties.   Admit wt 121.1 kg         Current wt 113.7 kg  I/Os: +2136 ml x 24 hrs       JP drains 145 ml x 24 hrs UOP 1325 ml x 24 hrs  Medications reviewed and include: vitamin C, colace, lasix, SSI, levemir 26 units every 12hr, MVI, protonix, miralax, senokot, zinc sulfate NaCl Labs:   NUTRITION - FOCUSED PHYSICAL EXAM: Unable to complete at this time, RD working remotely.   Diet Order:   Diet Order            Diet NPO time specified  Diet effective now              EDUCATION NEEDS:   No education needs have been identified at this time  Skin:  Skin Assessment: Skin Integrity Issues: Skin Integrity  Issues:: Stage II Stage II: right & left buttocks  Last BM:  11/16  Height:   Ht Readings from Last 1 Encounters:  05/06/19 5\' 9"  (1.753 m)    Weight:   Wt Readings from Last 1 Encounters:  05/26/19 113.7 kg    Ideal Body Weight:  72.7 kg  BMI:  Body mass index is 37.02 kg/m.  Estimated Nutritional Needs:   Kcal:  2100-2300  Protein:  116-130  Fluid:  >/= 2.1 L.day  Lajuan Lines, RD, LDN Clinical Nutrition Office [phone] After Hours/Weekend Pager: [phone]

## 2019-05-26 NOTE — Progress Notes (Signed)
   05/26/19 2330  Vitals  BP (!) 100/36  MAP (mmHg) (!) 53  Pulse Rate 98  ECG Heart Rate 99  Resp (!) 28  Oxygen Therapy  SpO2 99 %  MEWS Score  MEWS RR 2  MEWS Pulse 0  MEWS Systolic 1  MEWS LOC 1  MEWS Temp 0  MEWS Score 4  MEWS Score Color Red  Provider Notification  Provider Name/Title Dr. Su Grand  Date Provider Notified 05/26/19  Time Provider Notified 2335  Notification Type Call  Notification Reason Change in status (BP 100/36 despite on dopamine at set rate)

## 2019-05-26 NOTE — Progress Notes (Signed)
NAMEDimetri Hale, MRN:  [id number], DOB:  28-Mar-1951, LOS: 20 ADMISSION DATE:  05/06/2019, CONSULTATION DATE: 05/25/2019 REFERRING MD: Cody Hale-neurosurgery, CHIEF COMPLAINT: Respiratory failure  Brief History   68 year old man who underwent multilevel spinal fusion today.  Difficult to ventilate in the operating room so was kept intubated and admitted to the intensive care unit for ongoing management.  History of present illness   11/17 underwent open reduction internal fixation of L1-L2 Cody Hale fracture with multilevel spine stabilization from T11-L4. In prone position for most of the surgery, difficult to ventilate requiring respiratory rate of 25 x 8 mL/kg to achieve an end-tidal CO2 of 52.  pH was acidotic.  No improvement with bronchodilators. Possible ST-T wave changes intraoperatively with no change in position.  Minimal urine output.  Extensive past medical history with complicated recent past: He presented 10/29 with back pain after mechanical fall and was found to have an unstable lumbar fracture.  Course in hospital complicated due to asymptomatic COVID-19 infection and GBS bacteremia.  Both have resolved and he was brought to surgery today.  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  Past Medical History   Past Medical History:  Diagnosis Date  . A-fib (HCC)   . Anxiety   . Atrial fibrillation (HCC)   . Cellulitis   . CHF (congestive heart failure) (HCC)   . Chronic venous insufficiency   . COPD (chronic obstructive pulmonary disease) (HCC)   . Coronary atherosclerosis of native coronary artery    Mild nonobstructive 08/2012  . Degenerative joint disease   . Diabetes mellitus, type II (HCC)    Gastroparesis; GI care at [facility]  . 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 s/p zyvox rx.  (Cultures never positive); left bundle  branch block; H/o SVT; 09/2012: bioprosthetic MVR at [facility]; a. Severe dental caries and cavities s/p multiple extractions.  . Essential hypertension, benign   . Gastroparesis   . 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 Left bundle branch block   . Left leg cellulitis   . Major depressive disorder, recurrent severe without psychotic features (HCC)    BH admission 12/2012  . Morbid obesity (HCC) 06/27/2012  . Nephrolithiasis   . PSVT (paroxysmal supraventricular tachycardia) (HCC)    Post-op at [facility]  . Sleep apnea    a. uses CPAP nightly  . Urinary tract infection 08/30/2012   Proteus mirabilis    Significant Hospital Events   10/29 admitted for back pain  10/29 positive Covid test-asymptomatic GBS bacteremia, TEE negative for endocarditis.  Completed antibiotics on 1116 Mild exacerbation of COPD requiring diuresis 11/12 Multilevel spinal stabilization 11/17. Consults:  Neurosurgery Critical care  Procedures:  11/17: PROCEDURE: 1. Open reduction, internal fixation L1-2 Cody Hale fracture 2. Posterior segmental instrumented pedicle screw stabilization, T11-L4 3. Posterolateral arthrodesis, T11-L4 Use of non-structural morcellized bone allograft - DBX 05/25/2019 intubation in OR per anesthesia>> Significant Diagnostic Tests:    Micro Data:  11/2-group G Streptococcus in blood.  Antimicrobials:  Penicillin G 11/3-11/17  Interim history/subjective:  Admitted on propofol from operating room.  No surgical complications per Dr. Conchita Hale.  Blood loss within expected range.  Objective   Blood pressure 117/72, pulse (!) 55, temperature 98.9 F (37.2 C), temperature source Oral, resp. rate 16, height 5\' 9"  (1.753 m), weight 113.7 kg, SpO2 97 %.    Vent Mode: PRVC FiO2 (%):  [40 %-  100 %] 40 % Set Rate:  [14 bmp] 14 bmp Vt Set:  [560 mL] 560 mL PEEP:  [5 cmH20] 5 cmH20 Plateau Pressure:  [20 cmH20-21 cmH20] 21 cmH20   Intake/Output Summary  (Last 24 hours) at 05/26/2019 0734 Last data filed at 05/26/2019 0700 Gross per 24 hour  Intake 3656.53 ml  Output 1520 ml  Net 2136.53 ml   Filed Weights   05/23/19 0300 05/25/19 0328 05/26/19 0500  Weight: 111.6 kg 114.3 kg 113.7 kg    Examination: General: Morbidly obese male who is poorly responsive currently on fentanyl 12.5 mcg hourly via drip HEENT: Pupils equal and reactive to light, endotracheal tube is in place, gastric tube is in place, no JVD or lymphadenopathy is appreciated Neuro: Poorly responsive.  Currently sedated. CV: Heart sounds are irregular right bundle branch block with wide QRS PULM: Decreased air movement GI: soft, bsx4 active bowel sounds Extremities: warm/dry, 2+ edema Skin: Lumbar dressing with Jackson-Pratt with bloody drainage noted GU: Foley catheter in place. 05/26/2019 no chest x-ray available   Resolved Hospital Problem list   Group B strep bacteremia  Assessment & Plan:  Critically ill due to hypercarbic respiratory failure prior mechanical ventilation.  Complicated by underlying COPD prone ventilation restrictive component during surgery. Wean per protocol  Minimize sedation with narcotics His weakened state may preclude extubation. Attempt negative intake and output if possible  Status post multilevel spinal surgery. Per neurosurgery Continue mobilization when extubated Pain control as needed  Atrial fibrillation on Xarelto at home. Resume IV heparin if okay with neurosurgery  COPD Bronchodilators   Chronic mixed heart failure  Intake/Output Summary (Last 24 hours) at 05/26/2019 0739 Last data filed at 05/26/2019 0700 Gross per 24 hour  Intake 3656.53 ml  Output 1520 ml  Net 2136.53 ml   No signs of overt heart failure at this time. Resume home medications if and when extubated if not per gastric tube Keep euvolemic or negative intake and output if possible noted to be +2 L despite Lasix.  Ankylosing spondylitis Spinal  precautions as necessary This may have impact on airway and ability to breathe  Poorly controlled diabetes mellitus Discontinue Lantus and continue Levemir Transition off insulin drip per protocol  Daily Goals Checklist  Pain/Anxiety/Delirium protocol (if indicated): Fentanyl infusion with Versed as needed VAP protocol (if indicated): Bundle in place Respiratory support goals: Proceed to SBT, extubate if passes 1 hour SBT Blood pressure target: Less than 607 systolic DVT prophylaxis: SCDs for now, resume IV heparin tomorrow Nutritional status and feeding goals: N.p.o. swallow evaluation and enteral nutrition post extubation GI prophylaxis: Pantoprazole Fluid status goals: Allow autoregulation for now. Urinary catheter: Assessment of intravascular volume Central lines: Right double-lumen PICC placed 05/27/2019 Glucose control: Phase 1 glycemic control.  We will discontinue Lantus and continue Levemir 05/26/2019 Mobility/therapy needs: PT as per stroke rehabilitation Antibiotic de-escalation: Perioperative antibiotics only Home medication reconciliation: We will reconcile medications once extubated Daily labs: CBC and BMP daily Code Status: Full code Family Communication: Per neurosurgery, 05/26/2019 pulmonary critical care also update family intermittently. Disposition: ICU.  Plan to extubate today.   Labs   CBC: Recent Labs  Lab 05/23/19 0455 05/24/19 0341 05/25/19 0415  05/25/19 1125 05/25/19 1220 05/25/19 1231 05/25/19 1501 05/26/19 0459  WBC 9.5 9.4 8.3  --   --   --  15.2*  --  11.9*  HGB 9.2* 9.0* 9.1*   < > 9.5* 9.9* 9.3* 8.8* 8.5*  HCT 28.9* 28.0* 28.5*   < >  28.0* 29.0* 29.9* 26.0* 27.3*  MCV 87.6 85.6 86.4  --   --   --  90.1  --  88.9  PLT 207 189 189  --   --   --  241  --  169   < > = values in this interval not displayed.    Basic Metabolic Panel: Recent Labs  Lab 05/23/19 0824 05/24/19 0341 05/24/19 1808 05/25/19 0415 05/25/19 1001 05/25/19 1125  05/25/19 1220 05/25/19 1501 05/26/19 0459  NA 127* 126* 128* 129* 125* 127* 127* 126* 130*  K 5.1 4.9 5.1 4.8 5.5* 5.4* 5.8* 5.4* 4.7  CL 89* 87* 87* 88* 90*  --  93*  --  96*  CO2 27 29 29 29   --   --   --   --  24  GLUCOSE 300* 260* 292* 184* 242*  --  312*  --  140*  BUN 20 19 19 20 23   --  26*  --  20  CREATININE 0.79 0.67 0.84 0.74 0.70  --  0.90  --  0.60*  CALCIUM 7.9* 7.9* 8.0* 8.2*  --   --   --   --  7.8*   GFR: Estimated Creatinine Clearance: 109.9 mL/min (A) (by C-G formula based on SCr of 0.6 mg/dL (L)). Recent Labs  Lab 05/24/19 0341 05/25/19 0415 05/25/19 1231 05/26/19 0459  WBC 9.4 8.3 15.2* 11.9*    Liver Function Tests: Recent Labs  Lab 05/19/19 0942  AST 41  ALT 19  ALKPHOS 65  BILITOT 1.2  PROT 5.4*  ALBUMIN 1.7*   No results for input(s): LIPASE, AMYLASE in the last 168 hours. No results for input(s): AMMONIA in the last 168 hours.  ABG    Component Value Date/Time   PHART 7.405 05/25/2019 1501   PCO2ART 43.6 05/25/2019 1501   PO2ART 389.0 (H) 05/25/2019 1501   HCO3 27.4 05/25/2019 1501   TCO2 29 05/25/2019 1501   ACIDBASEDEF 1.7 03/02/2018 2230   O2SAT 100.0 05/25/2019 1501     Coagulation Profile: No results for input(s): INR, PROTIME in the last 168 hours.  Cardiac Enzymes: Recent Labs  Lab 05/25/19 1323  CKTOTAL 136  CKMB 1.3    HbA1C: Hgb A1c MFr Bld  Date/Time Value Ref Range Status  03/24/2019 02:13 PM 7.4 (H) 4.6 - 6.5 % Final    Comment:    Glycemic Control Guidelines for People with Diabetes:Non Diabetic:  <6%Goal of Therapy: <7%Additional Action Suggested:  >8%   12/31/2018 12:19 PM 8.3 (H) 4.6 - 6.5 % Final    Comment:    Glycemic Control Guidelines for People with Diabetes:Non Diabetic:  <6%Goal of Therapy: <7%Additional Action Suggested:  >8%     CBG: Recent Labs  Lab 05/26/19 0002 05/26/19 0103 05/26/19 0202 05/26/19 0303 05/26/19 0501  GLUCAP 199* 174* 166* 151* 144*    CRITICAL CARE Performed by:     App cct 30 min  Brett Canales  ACNP [facility] P[facility] Pager [phone] till 1 pm If no answer page [phone] 05/26/2019, 7:34 AM

## 2019-05-26 NOTE — Progress Notes (Signed)
SLP Cancellation Note  Patient Details Name: Cody Hale MRN: [id number] DOB: 05-24-1951   Cancelled treatment:        Pt on vent. Will follow.   Cody Hale 05/26/2019, 7:39 AM   Cody Hale [name].Ed Risk analyst [phone] Office [phone]

## 2019-05-26 NOTE — Progress Notes (Signed)
eLink Physician-Brief Progress Note Patient Name: Cody Hale DOB: 02-10-1951 MRN: [id number]   Date of Service  05/26/2019  HPI/Events of Note  BP 100/36,  MAP 53 on 2.5 mcg of Dopamine.  eICU Interventions  Dopamine infusion increased to 5 mcg.        [name] 05/26/2019, 11:43 PM

## 2019-05-26 NOTE — Progress Notes (Signed)
Physical Therapy Evaluation  Clinical impression: Patient now hospitalized 20 days s/p fall and multiple complications delaying back surgery. Attempted to sit pt in chair position with pt's BP dropping. Back brace has not been delivered, therefore left pt with HOB only 45 degrees to promote upright tolerance. Anticipate slower progress due to prolonged period of inactivity.  Patient has functional limitations due to the deficits listed below (see PT Problem List).  Patient will benefit from skilled PT to increase their independence and safety with mobility to allow discharge to the next venue (TBA as pt improves medically and tolerate more activity).      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 Remained intubated until 07/25/18 PMHx: Afib, MVR, SVT, anxiety, CAD, COPD, CHF, HTN,   Precautions  Precautions Back;Fall  Precaution Booklet Issued No  Precaution Comments Educated pt on no bending or twisting; very groggy  Required Braces or Orthoses Spinal Brace  Spinal Brace  (awaiting delivery of TLSO; to be donned in sitting)  Restrictions  Weight Bearing Restrictions No  Home Living  Family/patient expects to be discharged to: Private residence  Living Arrangements Alone  Available  Help at Discharge Family;Available PRN/intermittently (son and dtr-in-law work)  Type of [place] to enter  [employer] of Steps 8  Entrance Stairs-Rails Right  Home Layout One level  Bathroom Shower/Tub Walk-in shower  [place] - 2 wheels;Shower seat;Cane - single point;BSC  Prior Function  Level of Independence Needs assistance  Gait / Transfers Assistance Needed SPC for mobility in community or scooter at store; RW in home.  ADL's / Homemaking Assistance Needed Others provide transport; electric scooter at stores  Comments Does not drive  Communication  Communication No difficulties  Pain Assessment  Pain Assessment Faces  Faces Pain Scale 8  Pain Location bilateral legs  Pain Descriptors / Indicators Discomfort;Grimacing  Pain Intervention(s) Limited activity within patient's tolerance;Monitored during session;Premedicated before session;Repositioned  Cognition  Arousal/Alertness Lethargic;Suspect due to medications  Behavior During Therapy Flat affect (sleepy)  Overall Cognitive Status Within Functional Limits for tasks assessed (not fully assessed with pt falling asleep between tasks)  Upper Extremity Assessment  Upper Extremity Assessment Defer to OT evaluation  Lower Extremity Assessment  Lower Extremity Assessment RLE deficits/detail;LLE deficits/detail  RLE Deficits / Details AAROM in supine with pt grimacing/guarding when hip and knee flexed 30 degrees indicating pain in upper quads; able to wiggle toes and DF/PF ankle  RLE Unable to fully assess due to pain  LLE Deficits / Details Leg rests in external rotation, abduction with knee flexed; AAROM in supine could not achieve neutral rotation (~-15 degrees) with pt grimacing/guarding when hip and knee flexed 45 degrees indicating pain in upper quads; able to wiggle toes and DF/PF ankle  LLE Unable to fully assess due to pain  Cervical /  Trunk Assessment  Cervical / Trunk Assessment Other exceptions  Cervical / Trunk Exceptions obesity; s/p lumbar fusion  Bed Mobility  Overal bed mobility Needs Assistance  General bed mobility comments scoot to [facility] with trendelenburg and +2 total assist; began to elevate to chair position for gradual upright position with pt's BP dropping to MAP 65; pt with decr alertness throughout session due to pain meds therefore difficult to assess if any further decline as BP dropped.   Transfers  General transfer comment unable   Ambulation/Gait  General Gait Details unable  PT - End of Session  Equipment Utilized During Treatment Oxygen  Activity Tolerance Treatment limited secondary to medical complications (Comment)  Patient left in bed;with call bell/phone within reach;with bed alarm set;with SCD's reapplied  Nurse Communication Mobility status;Other (comment) (drop in BP)  PT Assessment  PT Recommendation/Assessment Patient needs continued PT services  PT Visit Diagnosis Muscle weakness (generalized) (M62.81);History of falling (Z91.81);Pain;Difficulty in walking, not elsewhere classified (R26.2)  Pain - part of body  (back)  PT Problem List Decreased strength;Decreased range of motion;Decreased activity tolerance;Decreased balance;Decreased mobility;Decreased knowledge of use of DME;Decreased knowledge of precautions;Cardiopulmonary status limiting activity;Obesity;Pain  Barriers to Discharge Decreased caregiver support  PT Plan  PT Frequency (ACUTE ONLY) Min 5X/week  PT Treatment/Interventions (ACUTE ONLY) DME instruction;Gait training;Functional mobility training;Therapeutic activities;Therapeutic exercise;Neuromuscular re-education;Patient/family education  AM-PAC PT "6 Clicks" Mobility Outcome Measure (Version 2)  Help needed turning from your back to your side while in a flat bed without using bedrails? 1  Help needed moving from lying on your back to sitting on the side of a flat bed  without using bedrails? 1  Help needed moving to and from a bed to a chair (including a wheelchair)? 1  Help needed standing up from a chair using your arms (e.g., wheelchair or bedside chair)? 1  Help needed to walk in hospital room? 1  Help needed climbing 3-5 steps with a railing?  1  6 Click Score 6  Consider Recommendation of Discharge To: CIR/SNF/LTACH  PT Recommendation  Follow Up Recommendations Other (comment) (anticipate will need post-acute therapies; TBA as progresses)  PT equipment Other (comment) (TBD)  Individuals Consulted  Consulted and Agree with Results and Recommendations Patient  Acute Rehab PT Goals  Patient Stated Goal get out of this bed  PT Goal Formulation With patient  Time For Goal Achievement 06/09/19  Potential to Achieve Goals Good  PT Time Calculation  PT Start Time (ACUTE ONLY) 1344  PT Stop Time (ACUTE ONLY) 1423  PT Time Calculation (min) (ACUTE ONLY) 39 min  PT General Charges  $$ ACUTE PT VISIT 1 Visit  PT Evaluation  $PT Eval High Complexity 1 High  Written Expression  Dominant Hand Right    Veda Canning, PT Pager [phone]

## 2019-05-26 NOTE — Progress Notes (Signed)
  NEUROSURGERY PROGRESS NOTE   No issues overnight.  Remains intubated   EXAM:  BP 102/78   Pulse 94   Temp 100.1 F (37.8 C) (Axillary)   Resp 17   Ht 5\' 9"  (1.753 m)   Wt 113.7 kg   SpO2 95%   BMI 37.02 kg/m   Intubated Opens eyes to voice Follows commands with wiggling bilateral toes, grip Incision: dried blood on bandage JP drain in place. 145cc output since yesterday  IMPRESSION/PLAN 68 y.o. male pod #1 T11-L4 fusion for L1 chance fracture. Remains intubated although following commands - wean to extubate per PCCM - Continue JP drain  - can restart heparin tomorrow am

## 2019-05-26 NOTE — Progress Notes (Signed)
  Speech Language Pathology  Patient Details Name: Cody Hale MRN: [id number] DOB: 04-28-1951 Today's Date: 05/26/2019 Time:  -     Pt extubated this morning. Seen by SLP prior to intubation 11/17 and had recommended FEES. Received order for swallow assessment. Will plan for FEES tomorrow                 GO                Cody Hale 05/26/2019, 3:31 PM  Cody Hale.Ed Risk analyst [phone] Office [phone]

## 2019-05-26 NOTE — Progress Notes (Signed)
   05/26/19 2034  Vitals  BP (!) 86/54  MAP (mmHg) (!) 62  Pulse Rate 86  ECG Heart Rate 91  Resp 16  Oxygen Therapy  SpO2 91 %  MEWS Score  MEWS RR 0  MEWS Pulse 0  MEWS Systolic 1  MEWS LOC 1  MEWS Temp 0  MEWS Score 2  MEWS Score Color Yellow  Provider Notification  Provider Name/Title Dr. Su Grand  Date Provider Notified 05/26/19  Time Provider Notified 2035  Notification Type Call  Notification Reason Change in status (BP low)  Response Other (Comment) (awaiting orders)

## 2019-05-26 NOTE — Progress Notes (Signed)
eLink Physician-Brief Progress Note Patient Name: Cody Hale DOB: [birth year] MRN: [id number]   Date of Service  05/26/2019  HPI/Events of Note  Hypotension, Pt has a history of moderately depressed LV function.  eICU Interventions  Dopamine 2.5 mcg/kg/min to support LV function and blood pressure.        Kerry Kass Colten Desroches 05/26/2019, 9:25 PM

## 2019-05-26 NOTE — Evaluation (Signed)
Occupational Therapy Evaluation Patient Details Name: Cody Hale MRN: [id number] DOB: 1951/04/25 Today's Date: 05/26/2019    History of Present Illness Pt is a 68 yo male presented 05/06/19 to ED after fall  (he tripped over some blankets), onto his buttocks with severe back pain;  CT lumbar spine showing unstable fracture L1-2 and likely ankylosing spondylitis; tested +COVID and developed fever and required TEE to r/o endocarditis prior to surgery;  11/17 underwent open reduction internal fixation of L1-L2 Chance fracture with multilevel spine stabilization from T11-L4. Remained intubated until 07/25/18 PMHx: Afib, MVR, SVT, anxiety, CAD, COPD, CHF, HTN,    Clinical Impression   Pt PTA: Pt living alone with family that I supportive, but work during the day. Pt reports independence with ADL and mobility with SPC vs RW depending on location. Pt reported no falls prior to this injury. Pt currently limited by decreased arousal, decreased strength, decreased activity tolerance, and decreased ability to care for self and perform ADL. Pt totalA +2 for for bed mobility and unable to transfer without back brace that has not arrived. Pt maxA to [place] for ADL as pt very lethargic and unable to arouse for more than a few seconds.BP remained soft: 84/57 65 MAP supine in bed with 30* elevation of HOB; 106/60 MAP of 73, HR 100. OT to continue to follow for ADL, mobility and HEP progression acutely and in post acute care settings. OT following.      Follow Up Recommendations  SNF;Supervision/Assistance - 24 hour    Equipment Recommendations  3 in 1 bedside commode    Recommendations for Other Services       Precautions / Restrictions Precautions Precautions: Back;Fall Precaution Booklet Issued: No Precaution Comments: Educated pt on no bending or twisting; very lethargic Required Braces or Orthoses: Spinal Brace Spinal Brace: Other (comment)(waiting on it) Restrictions Weight Bearing  Restrictions: No      Mobility Bed Mobility Overal bed mobility: Needs Assistance             General bed mobility comments: Pt scooting to HOB in trendelenberg with assist totalA+2. Pt with MAP decreasing to 65 and pt becoming les and less alert so pt returned to 30* bed HOB elevation.  Transfers                 General transfer comment: deferred as pt without back brace    Balance                                           ADL either performed or assessed with clinical judgement   ADL Overall ADL's : Needs assistance/impaired Eating/Feeding: NPO   Grooming: Moderate assistance;Bed level   Upper Body Bathing: Moderate assistance;Bed level   Lower Body Bathing: Maximal assistance;Cueing for safety;Cueing for sequencing;Sitting/lateral leans;Bed level   Upper Body Dressing : Moderate assistance;Sitting;Cueing for safety   Lower Body Dressing: Maximal assistance;Cueing for safety;Cueing for sequencing;Sitting/lateral leans;Bed level   Toilet Transfer: Total assistance Toilet Transfer Details (indicate cue type and reason): deferred as pt did not receive brace yet Toileting- Clothing Manipulation and Hygiene: Maximal assistance;Total assistance;Sitting/lateral lean;Bed level       Functional mobility during ADLs: Total assistance;+2 for physical assistance;+2 for safety/equipment General ADL Comments: Pt limited by decreased arousal, decreased strength, decreased activity tolerance, and decreased ability to care for self and perform ADL.     Vision Baseline Vision/History:  Wears glasses Wears Glasses: At all times Patient Visual Report: Other (comment)(does not have glasses on) Vision Assessment?: Yes     Perception     Praxis      Pertinent Vitals/Pain Pain Assessment: Faces Faces Pain Scale: Hurts whole lot Pain Location: bilateral legs Pain Descriptors / Indicators: Discomfort;Grimacing Pain Intervention(s): Limited activity within  patient's tolerance     Hand Dominance Right   Extremity/Trunk Assessment Upper Extremity Assessment Upper Extremity Assessment: Generalized weakness;RUE deficits/detail;LUE deficits/detail RUE Deficits / Details: 3-/5 MM grade overall, decreased grip strength; edema RUE Coordination: decreased fine motor LUE Deficits / Details: 3-/5 MM grade overall, decreased grip strength; edema LUE Coordination: decreased fine motor   Lower Extremity Assessment Lower Extremity Assessment: Defer to PT evaluation;Generalized weakness RLE Deficits / Details: AAROM in supine with pt grimacing/guarding when hip and knee flexed 30 degrees indicating pain in upper quads; able to wiggle toes and DF/PF ankle RLE: Unable to fully assess due to pain LLE Deficits / Details: Leg rests in external rotation, abduction with knee flexed; AAROM in supine could not achieve neutral rotation (~-15 degrees) with pt grimacing/guarding when hip and knee flexed 45 degrees indicating pain in upper quads; able to wiggle toes and DF/PF ankle LLE: Unable to fully assess due to pain   Cervical / Trunk Assessment Cervical / Trunk Assessment: Other exceptions Cervical / Trunk Exceptions: obesity; s/p lumbar fusion   Communication Communication Communication: No difficulties   Cognition Arousal/Alertness: Lethargic;Suspect due to medications Behavior During Therapy: Flat affect Overall Cognitive Status: Within Functional Limits for tasks assessed                                 General Comments: Pt required continued cues for arousal   General Comments  transfers deferred as pt unable to without back brace; BP remained soft: 84/57 65 MAP supine in bed with 30* elevation of HOB; 106/60 MAP of 73, HR 100.    Exercises Exercises: Other exercises Other Exercises Other Exercises: shoulder, elbow, wrist and hand AROM x5 reps flex/ext   Shoulder Instructions      Home Living Family/patient expects to be  discharged to:: Private residence Living Arrangements: Alone Available Help at Discharge: Family;Available PRN/intermittently(son and DIL work) Type of Home: Apartment Home Access: Stairs to enter Secretary/administrator of Steps: 8 Entrance Stairs-Rails: Right Home Layout: One level     Bathroom Shower/Tub: Producer, television/film/video: Handicapped height     Home Equipment: Environmental consultant - 2 wheels;Shower seat;Cane - single point;Bedside commode          Prior Functioning/Environment Level of Independence: Needs assistance  Gait / Transfers Assistance Needed: SPC for mobility in community or scooter at store; RW in home. ADL's / Homemaking Assistance Needed: Reports independence prior; Others provide transport; electric scooter at stores   Comments: Does not drive        OT Problem List: Decreased strength;Decreased activity tolerance;Impaired balance (sitting and/or standing);Decreased safety awareness;Impaired UE functional use;Pain;Increased edema;Decreased knowledge of use of DME or AE;Decreased coordination      OT Treatment/Interventions:      OT Goals(Current goals can be found in the care plan section) Acute Rehab OT Goals Patient Stated Goal: get out of this bed OT Goal Formulation: With patient Time For Goal Achievement: 06/09/19 Potential to Achieve Goals: Good ADL Goals Pt Will Perform Grooming: with min assist;sitting Pt Will Perform Upper Body Dressing: with min assist;sitting  Pt Will Transfer to Toilet: stand pivot transfer;bedside commode;with mod assist Pt/caregiver will Perform Home Exercise Program: Both right and left upper extremity;Increased strength;With minimal assist;With written HEP provided Additional ADL Goal #1: Pt will follow 100% commands in 4/6 trials with 90% accuracy.  OT Frequency: Min 2X/week   Barriers to D/C: Decreased caregiver support  no one to stay 24/7       Co-evaluation PT/OT/SLP Co-Evaluation/Treatment: Yes Reason for  Co-Treatment: Complexity of the patient's impairments (multi-system involvement);For patient/therapist safety   OT goals addressed during session: ADL's and self-care      AM-PAC OT "6 Clicks" Daily Activity     Outcome Measure Help from another person eating meals?: Total Help from another person taking care of personal grooming?: A Lot Help from another person toileting, which includes using toliet, bedpan, or urinal?: Total Help from another person bathing (including washing, rinsing, drying)?: Total Help from another person to put on and taking off regular upper body clothing?: A Lot Help from another person to put on and taking off regular lower body clothing?: Total 6 Click Score: 8   End of Session Nurse Communication: Mobility status;Precautions  Activity Tolerance: Patient limited by pain;Patient limited by lethargy Patient left: in bed;with call bell/phone within reach;with bed alarm set  OT Visit Diagnosis: Unsteadiness on feet (R26.81);Muscle weakness (generalized) (M62.81);Pain Pain - part of body: (back)                Time: 1350-1420 OT Time Calculation (min): 30 min Charges:  OT General Charges $OT Visit: 1 Visit OT Evaluation $OT Eval Moderate Complexity: 1 Mod  Cristi Loron) Glendell Docker OTR/L Acute Rehabilitation Services Pager: [phone] Office: [phone]    Lonzo Cloud 05/26/2019, 3:45 PM

## 2019-05-27 ENCOUNTER — Inpatient Hospital Stay (HOSPITAL_COMMUNITY): Payer: Medicare Other

## 2019-05-27 DIAGNOSIS — J9602 Acute respiratory failure with hypercapnia: Secondary | ICD-10-CM | POA: Diagnosis not present

## 2019-05-27 LAB — BASIC METABOLIC PANEL
Anion gap: 6 (ref 5–15)
BUN: 34 mg/dL — ABNORMAL HIGH (ref 8–23)
CO2: 28 mmol/L (ref 22–32)
Calcium: 7.7 mg/dL — ABNORMAL LOW (ref 8.9–10.3)
Chloride: 97 mmol/L — ABNORMAL LOW (ref 98–111)
Creatinine, Ser: 0.77 mg/dL (ref 0.61–1.24)
GFR calc Af Amer: >60 mL/min (ref >60)
GFR calc non Af Amer: >60 mL/min (ref >60)
Glucose, Bld: 315 mg/dL — ABNORMAL HIGH (ref 70–99)
Potassium: 4.3 mmol/L (ref 3.5–5.1)
Sodium: 131 mmol/L — ABNORMAL LOW (ref 135–145)

## 2019-05-27 LAB — CBC WITH DIFFERENTIAL/PLATELET
Abs Immature Granulocytes: 0.03 10*3/uL (ref 0.00–0.07)
Basophils Absolute: 0 10*3/uL (ref 0.0–0.1)
Basophils Relative: 0 %
Eosinophils Absolute: 0.1 10*3/uL (ref 0.0–0.5)
Eosinophils Relative: 1 %
HCT: 27.1 % — ABNORMAL LOW (ref 39.0–52.0)
Hemoglobin: 8.1 g/dL — ABNORMAL LOW (ref 13.0–17.0)
Immature Granulocytes: 0 %
Lymphocytes Relative: 6 %
Lymphs Abs: 0.4 10*3/uL — ABNORMAL LOW (ref 0.7–4.0)
MCH: 27.6 pg (ref 26.0–34.0)
MCHC: 29.9 g/dL — ABNORMAL LOW (ref 30.0–36.0)
MCV: 92.2 fL (ref 80.0–100.0)
Monocytes Absolute: 0.6 10*3/uL (ref 0.1–1.0)
Monocytes Relative: 8 %
Neutro Abs: 5.8 10*3/uL (ref 1.7–7.7)
Neutrophils Relative %: 85 %
Platelets: 131 10*3/uL — ABNORMAL LOW (ref 150–400)
RBC: 2.94 MIL/uL — ABNORMAL LOW (ref 4.22–5.81)
RDW: 17 % — ABNORMAL HIGH (ref 11.5–15.5)
WBC: 7 10*3/uL (ref 4.0–10.5)
nRBC: 0 % (ref 0.0–0.2)

## 2019-05-27 LAB — GLUCOSE, CAPILLARY
Glucose-Capillary: 184 mg/dL — ABNORMAL HIGH (ref 70–99)
Glucose-Capillary: 199 mg/dL — ABNORMAL HIGH (ref 70–99)
Glucose-Capillary: 218 mg/dL — ABNORMAL HIGH (ref 70–99)
Glucose-Capillary: 258 mg/dL — ABNORMAL HIGH (ref 70–99)
Glucose-Capillary: 267 mg/dL — ABNORMAL HIGH (ref 70–99)
Glucose-Capillary: 293 mg/dL — ABNORMAL HIGH (ref 70–99)
Glucose-Capillary: 307 mg/dL — ABNORMAL HIGH (ref 70–99)

## 2019-05-27 LAB — POCT I-STAT 7, (LYTES, BLD GAS, ICA,H+H)
Acid-Base Excess: 3 mmol/L — ABNORMAL HIGH (ref 0.0–2.0)
Bicarbonate: 28.8 mmol/L — ABNORMAL HIGH (ref 20.0–28.0)
Calcium, Ion: 1.11 mmol/L — ABNORMAL LOW (ref 1.15–1.40)
HCT: 25 % — ABNORMAL LOW (ref 39.0–52.0)
Hemoglobin: 8.5 g/dL — ABNORMAL LOW (ref 13.0–17.0)
O2 Saturation: 98 %
Potassium: 4.3 mmol/L (ref 3.5–5.1)
Sodium: 132 mmol/L — ABNORMAL LOW (ref 135–145)
TCO2: 30 mmol/L (ref 22–32)
pCO2 arterial: 52.5 mmHg — ABNORMAL HIGH (ref 32.0–48.0)
pH, Arterial: 7.347 — ABNORMAL LOW (ref 7.350–7.450)
pO2, Arterial: 104 mmHg (ref 83.0–108.0)

## 2019-05-27 LAB — MAGNESIUM: Magnesium: 2.3 mg/dL (ref 1.7–2.4)

## 2019-05-27 LAB — HEPARIN LEVEL (UNFRACTIONATED)
Heparin Unfractionated: 1.74 IU/mL — ABNORMAL HIGH (ref 0.30–0.70)
Heparin Unfractionated: 0.24 IU/mL — ABNORMAL LOW (ref 0.30–0.70)

## 2019-05-27 LAB — PHOSPHORUS: Phosphorus: 3.4 mg/dL (ref 2.5–4.6)

## 2019-05-27 MED ORDER — HYDROMORPHONE HCL 1 MG/ML IJ SOLN
INTRAMUSCULAR | Status: AC
  Filled 2019-05-27: qty 1

## 2019-05-27 MED ORDER — INSULIN ASPART 100 UNIT/ML ~~LOC~~ SOLN: 0.0000 [IU] | SUBCUTANEOUS | Status: DC

## 2019-05-27 MED ORDER — HEPARIN (PORCINE) 25000 UT/250ML-% IV SOLN
2450.0000 [IU]/h | INTRAVENOUS | Status: DC
  Administered 2019-05-27: 21:00:00 2450 [IU]/h via INTRAVENOUS
  Administered 2019-05-27: 2350 [IU]/h via INTRAVENOUS
  Administered 2019-05-28 – 2019-06-02 (×13): 2450 [IU]/h via INTRAVENOUS
  Filled 2019-05-27 (×14): qty 250

## 2019-05-27 MED ORDER — INSULIN DETEMIR 100 UNIT/ML ~~LOC~~ SOLN
30.0000 [IU] | Freq: Two times a day (BID) | SUBCUTANEOUS | Status: DC
  Administered 2019-05-27 (×2): 30 [IU] via SUBCUTANEOUS
  Filled 2019-05-27 (×4): qty 0.3

## 2019-05-27 MED ORDER — OXYCODONE HCL 5 MG PO TABS
5.0000 mg | ORAL_TABLET | ORAL | Status: DC | PRN
  Administered 2019-05-27 – 2019-06-17 (×50): 5 mg
  Filled 2019-05-27 (×52): qty 1

## 2019-05-27 MED ORDER — INSULIN ASPART 100 UNIT/ML ~~LOC~~ SOLN: 3.0000 [IU] | SUBCUTANEOUS | Status: DC

## 2019-05-27 MED ORDER — INSULIN ASPART 100 UNIT/ML ~~LOC~~ SOLN
0.0000 [IU] | SUBCUTANEOUS | Status: DC
  Administered 2019-05-27: 15 [IU] via SUBCUTANEOUS
  Administered 2019-05-27: 11 [IU] via SUBCUTANEOUS
  Administered 2019-05-27: 4 [IU] via SUBCUTANEOUS
  Administered 2019-05-27: 11 [IU] via SUBCUTANEOUS
  Administered 2019-05-27: 4 [IU] via SUBCUTANEOUS
  Administered 2019-05-27: 7 [IU] via SUBCUTANEOUS
  Administered 2019-05-27: 11 [IU] via SUBCUTANEOUS
  Administered 2019-05-28 (×3): 7 [IU] via SUBCUTANEOUS
  Administered 2019-05-28: 11 [IU] via SUBCUTANEOUS
  Administered 2019-05-28 (×2): 7 [IU] via SUBCUTANEOUS
  Administered 2019-05-29 (×2): 11 [IU] via SUBCUTANEOUS
  Administered 2019-05-29: 7 [IU] via SUBCUTANEOUS
  Administered 2019-05-29: 4 [IU] via SUBCUTANEOUS
  Administered 2019-05-29: 7 [IU] via SUBCUTANEOUS
  Administered 2019-05-30 (×4): 4 [IU] via SUBCUTANEOUS
  Administered 2019-05-30: 7 [IU] via SUBCUTANEOUS
  Administered 2019-05-30 – 2019-05-31 (×5): 4 [IU] via SUBCUTANEOUS
  Administered 2019-05-31 – 2019-06-01 (×2): 3 [IU] via SUBCUTANEOUS
  Administered 2019-06-01 (×2): 4 [IU] via SUBCUTANEOUS
  Administered 2019-06-01: 7 [IU] via SUBCUTANEOUS
  Administered 2019-06-01: 4 [IU] via SUBCUTANEOUS
  Administered 2019-06-01 – 2019-06-02 (×2): 3 [IU] via SUBCUTANEOUS
  Administered 2019-06-02 (×3): 4 [IU] via SUBCUTANEOUS
  Administered 2019-06-02: 3 [IU] via SUBCUTANEOUS
  Administered 2019-06-02 – 2019-06-03 (×7): 4 [IU] via SUBCUTANEOUS
  Administered 2019-06-04: 7 [IU] via SUBCUTANEOUS
  Administered 2019-06-04 (×2): 4 [IU] via SUBCUTANEOUS
  Administered 2019-06-04: 3 [IU] via SUBCUTANEOUS
  Administered 2019-06-05: 7 [IU] via SUBCUTANEOUS
  Administered 2019-06-05: 4 [IU] via SUBCUTANEOUS
  Administered 2019-06-05: 7 [IU] via SUBCUTANEOUS
  Administered 2019-06-05: 4 [IU] via SUBCUTANEOUS
  Administered 2019-06-06 (×4): 7 [IU] via SUBCUTANEOUS
  Administered 2019-06-06: 3 [IU] via SUBCUTANEOUS
  Administered 2019-06-06: 7 [IU] via SUBCUTANEOUS
  Administered 2019-06-07 (×3): 11 [IU] via SUBCUTANEOUS
  Administered 2019-06-07: 7 [IU] via SUBCUTANEOUS
  Administered 2019-06-07 – 2019-06-08 (×2): 11 [IU] via SUBCUTANEOUS
  Administered 2019-06-08: 17:00:00 7 [IU] via SUBCUTANEOUS
  Administered 2019-06-08: 11 [IU] via SUBCUTANEOUS
  Administered 2019-06-08: 15 [IU] via SUBCUTANEOUS
  Administered 2019-06-08 (×2): 11 [IU] via SUBCUTANEOUS
  Administered 2019-06-09: 7 [IU] via SUBCUTANEOUS
  Administered 2019-06-09 (×4): 11 [IU] via SUBCUTANEOUS
  Administered 2019-06-09: 7 [IU] via SUBCUTANEOUS
  Administered 2019-06-10: 15 [IU] via SUBCUTANEOUS
  Administered 2019-06-10: 01:00:00 4 [IU] via SUBCUTANEOUS
  Administered 2019-06-10 (×3): 7 [IU] via SUBCUTANEOUS
  Administered 2019-06-10 – 2019-06-11 (×3): 11 [IU] via SUBCUTANEOUS
  Administered 2019-06-11 (×3): 7 [IU] via SUBCUTANEOUS
  Administered 2019-06-11: 13:00:00 11 [IU] via SUBCUTANEOUS
  Administered 2019-06-11: 4 [IU] via SUBCUTANEOUS
  Administered 2019-06-12: 15 [IU] via SUBCUTANEOUS
  Administered 2019-06-12: 3 [IU] via SUBCUTANEOUS
  Administered 2019-06-12: 12:00:00 11 [IU] via SUBCUTANEOUS
  Administered 2019-06-12: 7 [IU] via SUBCUTANEOUS
  Administered 2019-06-12: 3 [IU] via SUBCUTANEOUS
  Administered 2019-06-13: 11 [IU] via SUBCUTANEOUS
  Administered 2019-06-13: 3 [IU] via SUBCUTANEOUS
  Administered 2019-06-13: 7 [IU] via SUBCUTANEOUS
  Administered 2019-06-13: 01:00:00 15 [IU] via SUBCUTANEOUS
  Administered 2019-06-13: 11 [IU] via SUBCUTANEOUS
  Administered 2019-06-13: 20 [IU] via SUBCUTANEOUS
  Administered 2019-06-13: 05:00:00 7 [IU] via SUBCUTANEOUS
  Administered 2019-06-14: 3 [IU] via SUBCUTANEOUS
  Administered 2019-06-14: 15 [IU] via SUBCUTANEOUS
  Administered 2019-06-14: 4 [IU] via SUBCUTANEOUS
  Administered 2019-06-14 (×2): 7 [IU] via SUBCUTANEOUS
  Administered 2019-06-15: 4 [IU] via SUBCUTANEOUS
  Administered 2019-06-15: 3 [IU] via SUBCUTANEOUS
  Administered 2019-06-15: 4 [IU] via SUBCUTANEOUS
  Administered 2019-06-15: 3 [IU] via SUBCUTANEOUS
  Administered 2019-06-15: 4 [IU] via SUBCUTANEOUS
  Administered 2019-06-16 (×2): 7 [IU] via SUBCUTANEOUS
  Administered 2019-06-16: 4 [IU] via SUBCUTANEOUS
  Administered 2019-06-16: 7 [IU] via SUBCUTANEOUS
  Administered 2019-06-16: 3 [IU] via SUBCUTANEOUS
  Administered 2019-06-16: 7 [IU] via SUBCUTANEOUS
  Administered 2019-06-17: 11 [IU] via SUBCUTANEOUS
  Administered 2019-06-17: 20:00:00 4 [IU] via SUBCUTANEOUS
  Administered 2019-06-17 (×2): 7 [IU] via SUBCUTANEOUS
  Administered 2019-06-17: 13:00:00 4 [IU] via SUBCUTANEOUS
  Administered 2019-06-18: 7 [IU] via SUBCUTANEOUS
  Administered 2019-06-18: 4 [IU] via SUBCUTANEOUS

## 2019-05-27 MED ORDER — INSULIN DETEMIR 100 UNIT/ML ~~LOC~~ SOLN
26.0000 [IU] | Freq: Two times a day (BID) | SUBCUTANEOUS | Status: DC
  Filled 2019-05-27 (×3): qty 0.26

## 2019-05-27 NOTE — Progress Notes (Signed)
Occupational Therapy Treatment Patient Details Name: Cody Hale MRN: [id number] DOB: [birth year] Today's Date: 05/27/2019    History of present illness Pt is a 68 yo male presented 05/06/19 to ED after fall  (he tripped over some blankets), onto his buttocks with severe back pain;  CT lumbar spine showing unstable fracture L1-2 and likely ankylosing spondylitis; tested +COVID and developed fever and required TEE to r/o endocarditis prior to surgery;  11/17 underwent open reduction internal fixation of L1-L2 Chance fracture with multilevel spine stabilization from T11-L4. Remained intubated until 07/25/18 PMHx: Afib, MVR, SVT, anxiety, CAD, COPD, CHF, HTN,    OT comments  Pt progressing with alertness and willingness to participate. Pt BP soft: 88/50 after BLE exercises; BP: 107/56 at 30* HOB. Pt rolling side to side several times with totalA+2 and to scoot to [facility] due to applying custom lumbar hard shell brace. Pt performing BUE AROM exercises shoulder through digits; BUE elevation for edema noted in hands. Pt would benefit from continued OT skilled services for ADL, mobility and safety in SNF setting. OT following.  95/69 BP at 40* HOB elevation.     Follow Up Recommendations  SNF;Supervision/Assistance - 24 hour    Equipment Recommendations  3 in 1 bedside commode    Recommendations for Other Services      Precautions / Restrictions Precautions Precautions: Back;Fall Precaution Booklet Issued: No Precaution Comments: Educated pt on no bending or twisting; very lethargic Required Braces or Orthoses: Spinal Brace Spinal Brace: Other (comment) Spinal Brace Comments: waiting on it Restrictions Weight Bearing Restrictions: No       Mobility Bed Mobility Overal bed mobility: Needs Assistance Bed Mobility: Rolling Rolling: Total assist;+2 for physical assistance         General bed mobility comments: Pt rolling side to side due to applying custom lumbar hard shell.    Transfers                 General transfer comment: Pt's BP is too soft for bed mobility    Balance                                           ADL either performed or assessed with clinical judgement   ADL Overall ADL's : Needs assistance/impaired Eating/Feeding: NPO   Grooming: Moderate assistance;Bed level                               Functional mobility during ADLs: Total assistance;+2 for physical assistance;+2 for safety/equipment General ADL Comments: Pt limited by decreased arousal, decreased strength, decreased activity tolerance, and decreased ability to care for self and perform ADL.     Vision   Vision Assessment?: No apparent visual deficits   Perception     Praxis      Cognition Arousal/Alertness: Lethargic;Suspect due to medications Behavior During Therapy: Flat affect Overall Cognitive Status: Within Functional Limits for tasks assessed                                 General Comments: Pt required continued cues for arousal        Exercises Exercises: Other exercises Other Exercises Other Exercises: shoulder, elbow, wrist and hand AROM x10 reps flex/ext   Shoulder Instructions  General Comments Pt BP soft: 88/50 after BLE exercises; BP: 107/56    Pertinent Vitals/ Pain       Pain Assessment: Faces Faces Pain Scale: Hurts little more Pain Intervention(s): Monitored during session  Home Living                                          Prior Functioning/Environment              Frequency  Min 2X/week        Progress Toward Goals  OT Goals(current goals can now be found in the care plan section)  Progress towards OT goals: Progressing toward goals  Acute Rehab OT Goals Patient Stated Goal: get out of this bed OT Goal Formulation: With patient Time For Goal Achievement: 06/09/19 Potential to Achieve Goals: Good ADL Goals Pt Will Perform Grooming: with  min assist;sitting Pt Will Perform Upper Body Dressing: with min assist;sitting Pt Will Transfer to Toilet: stand pivot transfer;bedside commode;with mod assist Pt/caregiver will Perform Home Exercise Program: Both right and left upper extremity;Increased strength;With minimal assist;With written HEP provided Additional ADL Goal #1: Pt will follow 100% commands in 4/6 trials with 90% accuracy.  Plan Discharge plan remains appropriate    Co-evaluation    PT/OT/SLP Co-Evaluation/Treatment: Yes Reason for Co-Treatment: Complexity of the patient's impairments (multi-system involvement)   OT goals addressed during session: ADL's and self-care      AM-PAC OT "6 Clicks" Daily Activity     Outcome Measure   Help from another person eating meals?: Total Help from another person taking care of personal grooming?: A Lot Help from another person toileting, which includes using toliet, bedpan, or urinal?: Total Help from another person bathing (including washing, rinsing, drying)?: Total Help from another person to put on and taking off regular upper body clothing?: A Lot Help from another person to put on and taking off regular lower body clothing?: Total 6 Click Score: 8    End of Session    OT Visit Diagnosis: Unsteadiness on feet (R26.81);Muscle weakness (generalized) (M62.81);Pain Pain - part of body: (back)   Activity Tolerance Patient limited by pain;Patient limited by lethargy   Patient Left in bed;with call bell/phone within reach   Nurse Communication Mobility status;Other (comment)(soft BP)        Time: 9935-7017 OT Time Calculation (min): 39 min  Charges: OT General Charges $OT Visit: 1 Visit OT Treatments $Therapeutic Activity: 8-22 mins $Therapeutic Exercise: 8-22 mins  Cristi Loron) Glendell Docker OTR/L Acute Rehabilitation Services Pager: [phone] Office: [phone]    Lonzo Cloud 05/27/2019, 5:04 PM

## 2019-05-27 NOTE — Progress Notes (Signed)
eLink Physician-Brief Progress Note Patient Name: Linkin Vizzini DOB: 02-Dec-1950 MRN: [id number]   Date of Service  05/27/2019  HPI/Events of Note  Hyperglycemia  eICU Interventions  Pt switched to resistant hyperglycemia SQ  correction insulin protocol for better glycemic control.        [place] U Ogan 05/27/2019, 12:12 AM

## 2019-05-27 NOTE — Progress Notes (Signed)
NAMEJuergen Hale, MRN:  [id number], DOB:  [birth year], LOS: 21 ADMISSION DATE:  05/06/2019, CONSULTATION DATE: 05/25/2019 REFERRING MD: [name]-neurosurgery, CHIEF COMPLAINT: Respiratory failure  Brief History   68 year old man who underwent multilevel spinal fusion today.  Difficult to ventilate in the operating room so was kept intubated and admitted to the intensive care unit for ongoing management.  History of present illness   11/17 underwent open reduction internal fixation of L1-L2 Chance fracture with multilevel spine stabilization from T11-L4. In prone position for most of the surgery, difficult to ventilate requiring respiratory rate of 25 x 8 mL/kg to achieve an end-tidal CO2 of 52.  pH was acidotic.  No improvement with bronchodilators. Possible ST-T wave changes intraoperatively with no change in position.  Minimal urine output.  Extensive past medical history with complicated recent past: He presented 10/29 with back pain after mechanical fall and was found to have an unstable lumbar fracture.  Course in hospital complicated due to asymptomatic COVID-19 infection and GBS bacteremia.  Both have resolved and he was brought to surgery today.  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  Past Medical History   Past Medical History:  Diagnosis Date  . A-fib (HCC)   . Anxiety   . Atrial fibrillation (HCC)   . Cellulitis   . CHF (congestive heart failure) (HCC)   . Chronic venous insufficiency   . COPD (chronic obstructive pulmonary disease) (HCC)   . Coronary atherosclerosis of native coronary artery    Mild nonobstructive 08/2012  . Degenerative joint disease   . Diabetes mellitus, type II (HCC)    Gastroparesis; GI care at [facility]  . 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 s/p zyvox rx.  (Cultures never positive); left bundle  branch block; H/o SVT; 09/2012: bioprosthetic MVR at [facility]; a. Severe dental caries and cavities s/p multiple extractions.  . Essential hypertension, benign   . Gastroparesis   . 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 Left bundle branch block   . Left leg cellulitis   . Major depressive disorder, recurrent severe without psychotic features (HCC)    BH admission 12/2012  . Morbid obesity (HCC) 06/27/2012  . Nephrolithiasis   . PSVT (paroxysmal supraventricular tachycardia) (HCC)    [facility]-op at [facility]  . Sleep apnea    a. uses CPAP nightly  . Urinary tract infection 08/30/2012   Proteus mirabilis    Significant Hospital Events   10/29 admitted for back pain  10/29 positive Covid test-asymptomatic GBS bacteremia, TEE negative for endocarditis.  Completed antibiotics on 1116 Mild exacerbation of COPD requiring diuresis 11/12 Multilevel spinal stabilization 11/17. Consults:  Neurosurgery Critical care  Procedures:  11/17: PROCEDURE: 1. Open reduction, internal fixation L1-2 Chance fracture 2. Posterior segmental instrumented pedicle screw stabilization, T11-L4 3. Posterolateral arthrodesis, T11-L4 Use of non-structural morcellized bone allograft - DBX 05/25/2019 intubation in OR per anesthesia>>extubated 11/18 Significant Diagnostic Tests:    Micro Data:  11/2-group G Streptococcus in blood.  Antimicrobials:  Penicillin G 11/3-11/17  Interim history/subjective:  Extubated on 05/27/2019.  Noted to be somnolent most likely from oxycodone.  Objective   Blood pressure 123/64, pulse 90, temperature 98.8 F (37.1 C), temperature source Axillary, resp. rate 16, height 5\' 9"  (1.753 m), weight 113.5 kg, SpO2 99 %.    FiO2 (%):  [36 %] 36 %   Intake/Output Summary (Last 24 hours)  at 05/27/2019 0831 Last data filed at 05/27/2019 0800 Gross per 24 hour  Intake 939.06 ml  Output 2005 ml  Net -1065.94 ml   Filed Weights   05/25/19 0328  05/26/19 0500 05/27/19 0500  Weight: 114.3 kg 113.7 kg 113.5 kg    Examination: General: Chronically ill obese male who is obviously oversedated when stimulated arouses and follows commands HEENT: No JVD or lymphadenopathy is appreciated Neuro: Somnolent until stimulated vigorously and follows commands with weakened lower extremities CV: Heart sounds are regular PULM: Congested cough decreased breath sounds in the bases GI: soft, bsx4 active  Extremities: warm/dry, lower extremities with 1-2+ edema back with Jackson-Pratt bloody drainage Skin: no rashes or lesions    Resolved Hospital Problem list   Group B strep bacteremia  Assessment & Plan:  Critically ill due to hypercarbic respiratory failure prior mechanical ventilation.  Complicated by underlying COPD prone ventilation restrictive component during surgery. Extubated 05/26/2019 Decrease sedation with narcotics maintain euvolemiaMaintain Maintain euvolemia Pulmonary toilet Decrease sedation   Status post multilevel spinal surgery. Per neurosurgery Mobilize Decrease oxycodone due to somnolence  Atrial fibrillation on Xarelto at home. Resume IV heparin 05/27/2019 Continue to monitor CBC for any drop in hemoglobin  COPD Continue bronchodilators   Chronic mixed heart failure  Intake/Output Summary (Last 24 hours) at 05/27/2019 0831 Last data filed at 05/27/2019 0800 Gross per 24 hour  Intake 939.06 ml  Output 2005 ml  Net -1065.94 ml   05/26/2019 Lasix was increased to 80 mg twice daily.  He is a -1 L over 24 hours.  He has been successfully liberated from mechanical ventilatory support but became hypotensive during the night.  Started on dopamine 05/27/2019 during the night. 05/27/2019 stop Lasix 05/27/2019 stop Coreg 05/27/2019 wean dopamine Once stabilized we can place Coreg and Lasix back into his pharmaceutical regimen  Ankylosing spondylitis Spinal precautions   Poorly controlled diabetes mellitus  CBG (last 3)  Recent Labs    05/26/19 2357 05/27/19 0346 05/27/19 0818  GLUCAP 307* 267* 258*   Increase Levemir 05/27/2019 l  Daily Goals Checklist  Pain/Anxiety/Delirium protocol (if indicated): DC fentanyl and Versed.  Decrease oxygen to 5 mg every 4 hours. VAP protocol (if indicated): Not applicable Respiratory support goals: Pulmonary toilet.  Decrease oxycodone due to somnolence. Blood pressure target: Less than 160 systolic DVT prophylaxis: SCDs for now, resume IV heparin 05/27/1999 Nutritional status and feeding goals: N.p.o. swallow evaluation and enteral nutrition post extubation GI prophylaxis: Pantoprazole Fluid status goals: Stopped Lasix and Coreg on 05/27/2019 due to hypotension and the initiation of dopamine drip. Urinary catheter: Assessment of intravascular volume Central lines: Right double-lumen PICC placed 05/27/2019 Glucose control: Phase 1 glycemic control.  We will discontinue Lantus and continue Levemir 05/26/2019, Levemir increased on 05/27/2019 Mobility/therapy needs: PT as per stroke rehabilitation Antibiotic de-escalation: Perioperative antibiotics only Home medication reconciliation: We will reconcile medications once extubated Daily labs: CBC and BMP daily Code Status: Full code Family Communication: Per neurosurgery,  intermittently per pulmonary critical care. Disposition: ICU.  Extubated 05/26/2019.  Hypotensive during the night most likely multifactorial therefore recommend 24 more hours in intensive care unit starting 05/27/2019   Labs   CBC: Recent Labs  Lab 05/23/19 0455 05/24/19 0341 05/25/19 0415  05/25/19 1125 05/25/19 1220 05/25/19 1231 05/25/19 1501 05/26/19 0459  WBC 9.5 9.4 8.3  --   --   --  15.2*  --  11.9*  HGB 9.2* 9.0* 9.1*   < > 9.5* 9.9* 9.3* 8.8* 8.5*  HCT 28.9* 28.0* 28.5*   < > 28.0* 29.0* 29.9* 26.0* 27.3*  MCV 87.6 85.6 86.4  --   --   --  90.1  --  88.9  PLT 207 189 189  --   --   --  241  --  169   < > =  values in this interval not displayed.    Basic Metabolic Panel: Recent Labs  Lab 05/23/19 0824 05/24/19 0341 05/24/19 1808 05/25/19 0415 05/25/19 1001 05/25/19 1125 05/25/19 1220 05/25/19 1501 05/26/19 0459  NA 127* 126* 128* 129* 125* 127* 127* 126* 130*  K 5.1 4.9 5.1 4.8 5.5* 5.4* 5.8* 5.4* 4.7  CL 89* 87* 87* 88* 90*  --  93*  --  96*  CO2 27 29 29 29   --   --   --   --  24  GLUCOSE 300* 260* 292* 184* 242*  --  312*  --  140*  BUN 20 19 19 20 23   --  26*  --  20  CREATININE 0.79 0.67 0.84 0.74 0.70  --  0.90  --  0.60*  CALCIUM 7.9* 7.9* 8.0* 8.2*  --   --   --   --  7.8*   GFR: Estimated Creatinine Clearance: 109.8 mL/min (A) (by C-G formula based on SCr of 0.6 mg/dL (L)). Recent Labs  Lab 05/24/19 0341 05/25/19 0415 05/25/19 1231 05/26/19 0459  WBC 9.4 8.3 15.2* 11.9*    Liver Function Tests: No results for input(s): AST, ALT, ALKPHOS, BILITOT, PROT, ALBUMIN in the last 168 hours. No results for input(s): LIPASE, AMYLASE in the last 168 hours. No results for input(s): AMMONIA in the last 168 hours.  ABG    Component Value Date/Time   PHART 7.405 05/25/2019 1501   PCO2ART 43.6 05/25/2019 1501   PO2ART 389.0 (H) 05/25/2019 1501   HCO3 27.4 05/25/2019 1501   TCO2 29 05/25/2019 1501   ACIDBASEDEF 1.7 03/02/2018 2230   O2SAT 100.0 05/25/2019 1501     Coagulation Profile: No results for input(s): INR, PROTIME in the last 168 hours.  Cardiac Enzymes: Recent Labs  Lab 05/25/19 1323  CKTOTAL 136  CKMB 1.3    HbA1C: Hgb A1c MFr Bld  Date/Time Value Ref Range Status  03/24/2019 02:13 PM 7.4 (H) 4.6 - 6.5 % Final    Comment:    Glycemic Control Guidelines for People with Diabetes:Non Diabetic:  <6%Goal of Therapy: <7%Additional Action Suggested:  >8%   12/31/2018 12:19 PM 8.3 (H) 4.6 - 6.5 % Final    Comment:    Glycemic Control Guidelines for People with Diabetes:Non Diabetic:  <6%Goal of Therapy: <7%Additional Action Suggested:  >8%     CBG:  Recent Labs  Lab 05/26/19 1952 05/26/19 2355 05/26/19 2357 05/27/19 0346 05/27/19 0818  GLUCAP 179* 321* 307* 267* 258*        App cct 30 min  Richardson Landry Minor ACNP Maryanna Shape PCCM
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
## 2019-05-27 NOTE — Progress Notes (Signed)
Physical Therapy Treatment Patient Details Name: Cody Hale MRN: [id number] DOB: 1951/02/28 Today's Date: 05/27/2019    History of Present Illness Pt is a 68 yo male presented 05/06/19 to ED after fall  (he tripped over some blankets), onto his buttocks with severe back pain;  CT lumbar spine showing unstable fracture L1-2 and likely ankylosing spondylitis; tested +COVID and developed fever and required TEE to r/o endocarditis prior to surgery;  11/17 underwent open reduction internal fixation of L1-L2 Chance fracture with multilevel spine stabilization from T11-L4. Remained intubated until 07/25/18 PMHx: Afib, MVR, SVT, anxiety, CAD, COPD, CHF, HTN,     PT Comments    Patient again limited by hypotension (was again weaned off dopamine earlier today). Participated with bil LE exercises and rolling to apply TLSO when it arrived. BP remained low and unable to progress beyond HOB 30 degrees.    Follow Up Recommendations  Other (comment)(anticipate will need post-acute therapies; TBA as progresses)     Equipment Recommendations  Other (comment)(TBD)    Recommendations for Other Services       Precautions / Restrictions Precautions Precautions: Back;Fall Precaution Booklet Issued: No Precaution Comments: Educated pt on no bending or twisting; very lethargic Required Braces or Orthoses: Spinal Brace Spinal Brace: Thoracolumbosacral orthotic;Applied in supine position Spinal Brace Comments: orthotist arrived with brace during exercise session Restrictions Weight Bearing Restrictions: No    Mobility  Bed Mobility Overal bed mobility: Needs Assistance Bed Mobility: Rolling Rolling: Total assist;+2 for physical assistance         General bed mobility comments: Pt rolling side to side due to applying custom lumbar hard shell.   Transfers                 General transfer comment: unable due to hypotension  Ambulation/Gait             General Gait Details:  unable   Stairs             Wheelchair Mobility    Modified Rankin (Stroke Patients Only)       Balance                                            Cognition Arousal/Alertness: Lethargic;Suspect due to medications Behavior During Therapy: Flat affect Overall Cognitive Status: Within Functional Limits for tasks assessed                                 General Comments: Pt required continued cues for arousal--less as session progressed      Exercises General Exercises - Lower Extremity Ankle Circles/Pumps: AROM;Both;15 reps Short Arc Quad: AROM;AAROM;Strengthening;Both;5 reps;Supine(LLE ~2/5 (assist to extend knee; pt able to slow descent of lower leg) Heel Slides: AAROM;Both;5 reps(resisted extension RLE stronger than LLE) Other: with pillows rolled under bil knees, hip extension/glut activation x 5 reps with pt getting good contraction bil glutes    General Comments General comments (skin integrity, edema, etc.): BP in supine 88/50 initially; performed LE exercises and improved to 107/56; after rolling to don brace and orthotist raised HOB to 30 degrees BP 87/34; returned to [facility] with BP improved      Pertinent Vitals/Pain Pain Assessment: Faces Faces Pain Scale: Hurts even more Pain Location: "butt" Pain Descriptors / Indicators: Discomfort;Grimacing Pain Intervention(s): Monitored during session;Repositioned  Home Living                      Prior Function            PT Goals (current goals can now be found in the care plan section) Acute Rehab PT Goals Patient Stated Goal: get out of this bed Time For Goal Achievement: 06/09/19 Potential to Achieve Goals: Good Progress towards PT goals: Not progressing toward goals - comment(hypotension remains an issue)    Frequency    Min 5X/week      PT Plan      Co-evaluation PT/OT/SLP Co-Evaluation/Treatment: Yes Reason for Co-Treatment: Complexity of the  patient's impairments (multi-system involvement);For patient/therapist safety PT goals addressed during session: Mobility/safety with mobility;Strengthening/ROM OT goals addressed during session: ADL's and self-care      AM-PAC PT "6 Clicks" Mobility   Outcome Measure  Help needed turning from your back to your side while in a flat bed without using bedrails?: Total Help needed moving from lying on your back to sitting on the side of a flat bed without using bedrails?: Total Help needed moving to and from a bed to a chair (including a wheelchair)?: Total Help needed standing up from a chair using your arms (e.g., wheelchair or bedside chair)?: Total Help needed to walk in hospital room?: Total Help needed climbing 3-5 steps with a railing? : Total 6 Click Score: 6    End of Session Equipment Utilized During Treatment: Oxygen Activity Tolerance: Treatment limited secondary to medical complications (Comment) Patient left: in bed;with call bell/phone within reach;with bed alarm set;with SCD's reapplied Nurse Communication: Mobility status;Other (comment)(drop in BP; how to remove TLSO (aim for 1 hour and check ski) PT Visit Diagnosis: Muscle weakness (generalized) (M62.81);History of falling (Z91.81);Pain;Difficulty in walking, not elsewhere classified (R26.2) Pain - part of body: (back)     Time: 0630-1601 PT Time Calculation (min) (ACUTE ONLY): 40 min  Charges:  $Therapeutic Exercise: 8-22 mins                      Barry Brunner, PT Pager [phone]    Rexanne Mano 05/27/2019, 5:40 PM

## 2019-05-27 NOTE — Progress Notes (Signed)
Inpatient Diabetes Program Recommendations  AACE/ADA: New Consensus Statement on Inpatient Glycemic Control (2015)  Target Ranges:  Prepandial:   less than 140 mg/dL      Peak postprandial:   less than 180 mg/dL (1-2 hours)      Critically ill patients:  140 - 180 mg/dL   Lab Results  Component Value Date   GLUCAP 258 (H) 05/27/2019   HGBA1C 7.4 (H) 03/24/2019    Review of Glycemic Control  Results for CRIST, KRUSZKA "DAVID" (MRN [id number]) as of 05/27/2019 11:43  Ref. Range 05/26/2019 23:55 05/26/2019 23:57 05/27/2019 03:46 05/27/2019 08:18  Glucose-Capillary Latest Ref Range: 70 - 99 mg/dL 321 (H) 307 (H) novolog 15units 267 (H) novolog 11units 258 (H) novolog 11units    Diabetes history: DM2 Outpatient Diabetes medications: Tresiba 50U QD + Novolog 5U TID with meals Current orders for Inpatient glycemic control: Lantus 30U QD + Correction Novolog 0-20 Q4HR  Inpatient Diabetes Program Recommendations:      -Please consider adding Novolog 4U tube feed coverage   Thank you, Geoffry Paradise, RN, BSN Diabetes Coordinator Inpatient Diabetes Program [phone] (team pager from 8a-5p)

## 2019-05-27 NOTE — Progress Notes (Signed)
ANTICOAGULATION CONSULT NOTE  Pharmacy Consult for Heparin Indication: atrial fibrillation   Patient Measurements: Height: 5\' 9"  (175.3 cm) Weight: 250 lb 3.6 oz (113.5 kg) IBW/kg (Calculated) : 70.7 Heparin Dosing Weight: 98 kg  Vital Signs: Temp: 98.8 F (37.1 C) (11/19 0400) Temp Source: Axillary (11/19 0400) BP: 123/64 (11/19 0800) Pulse Rate: 86 (11/19 0838)  Labs: Recent Labs    05/25/19 0415 05/25/19 1001  05/25/19 1220 05/25/19 1231 05/25/19 1323 05/25/19 1501 05/26/19 0459 05/27/19 0902  HGB 9.1* 9.9*   < > 9.9* 9.3*  --  8.8* 8.5* 8.5*  HCT 28.5* 29.0*   < > 29.0* 29.9*  --  26.0* 27.3* 25.0*  PLT 189  --   --   --  241  --   --  169  --   CREATININE 0.74 0.70  --  0.90  --   --   --  0.60*  --   CKTOTAL  --   --   --   --   --  136  --   --   --   CKMB  --   --   --   --   --  1.3  --   --   --    < > = values in this interval not displayed.     Assessment: 68 year old male on Xarelto prior to admission for atrial fibrillation who presented post fall and was transitioned to Heparin IV due to need for possible lumbar surgery on 11/17.   Pt is now POD#2 for spinal surgery and heparin will restart today.   Goal of Therapy:  Heparin level 0.3-0.7 units/ml Monitor platelets by anticoagulation protocol: Yes  Plan:  Restart heparin gtt at previously therapeutic rate of 2350 units/hr - will not bolus for now with recent surgery Check an 8 hr heparin level Daily heparin level and CBC  Salome Arnt, PharmD, BCPS Clinical Pharmacist Please see AMION for all pharmacy numbers 05/27/2019 9:18 AM

## 2019-05-27 NOTE — Progress Notes (Signed)
ANTICOAGULATION CONSULT NOTE  Pharmacy Consult for Heparin Indication: atrial fibrillation   Patient Measurements: Height: 5\' 9"  (175.3 cm) Weight: 250 lb 3.6 oz (113.5 kg) IBW/kg (Calculated) : 70.7 Heparin Dosing Weight: 98 kg  Vital Signs: Temp: 98.5 F (36.9 C) (11/19 1500) Temp Source: Axillary (11/19 1500) BP: 103/49 (11/19 1930) Pulse Rate: 87 (11/19 1930)  Labs: Recent Labs    05/25/19 1220 05/25/19 1231 05/25/19 1323  05/26/19 0459 05/27/19 0902 05/27/19 0911 05/27/19 1744 05/27/19 1931  HGB 9.9* 9.3*  --    < > 8.5* 8.5* 8.1*  --   --   HCT 29.0* 29.9*  --    < > 27.3* 25.0* 27.1*  --   --   PLT  --  241  --   --  169  --  131*  --   --   HEPARINUNFRC  --   --   --   --   --   --   --  1.74* 0.24*  CREATININE 0.90  --   --   --  0.60*  --  0.77  --   --   CKTOTAL  --   --  136  --   --   --   --   --   --   CKMB  --   --  1.3  --   --   --   --   --   --    < > = values in this interval not displayed.     Assessment: 68 year old male on Xarelto prior to admission for atrial fibrillation who presented post fall and was transitioned to Heparin IV due to need for possible lumbar surgery on 11/17.   Pt is now POD#2 for spinal surgery and heparin was restarted today  Initial hep lvl was 1.74 - but was drawn through PICC with hep infusing so will ignore Repeat with heparin peripheral now and drawn through PICC is 0.24  Goal of Therapy:  Heparin level 0.3-0.7 units/ml Monitor platelets by anticoagulation protocol: Yes  Plan:  Increase hep 2450 units/hr  Next lvl 0500  Barth Kirks, PharmD, BCPS, BCCCP Clinical Pharmacist [phone]  Please check AMION for all [place] numbers  05/27/2019 8:12 PM

## 2019-05-27 NOTE — Progress Notes (Signed)
  NEUROSURGERY PROGRESS NOTE   Extubated yesterday Complains of appropriate back soreness  EXAM:  BP (!) 105/55   Pulse 63   Temp 98.8 F (37.1 C) (Axillary)   Resp 14   Ht 5\' 9"  (1.753 m)   Wt 113.5 kg   SpO2 100%   BMI 36.95 kg/m   Awake, alert Speech appropriate  CN grossly intact  Generalized weakness in BLE but nonfocal Incision: dried blood JP drain in place  IMPRESSION/PLAN 68 y.o. male pod #2 T11-L4 fusion for L1 chance fracture. Generalized weakness, but nonfocal - heparin started this am. Monitor exam - continue JP drain - up with brace

## 2019-05-28 ENCOUNTER — Inpatient Hospital Stay (HOSPITAL_COMMUNITY): Payer: Medicare Other

## 2019-05-28 ENCOUNTER — Encounter (HOSPITAL_COMMUNITY): Payer: Self-pay | Admitting: Neurosurgery

## 2019-05-28 DIAGNOSIS — J9602 Acute respiratory failure with hypercapnia: Secondary | ICD-10-CM | POA: Diagnosis not present

## 2019-05-28 LAB — BASIC METABOLIC PANEL
Anion gap: 9 (ref 5–15)
BUN: 39 mg/dL — ABNORMAL HIGH (ref 8–23)
CO2: 28 mmol/L (ref 22–32)
Calcium: 7.8 mg/dL — ABNORMAL LOW (ref 8.9–10.3)
Chloride: 98 mmol/L (ref 98–111)
Creatinine, Ser: 0.7 mg/dL (ref 0.61–1.24)
GFR calc Af Amer: >60 mL/min (ref >60)
GFR calc non Af Amer: >60 mL/min (ref >60)
Glucose, Bld: 244 mg/dL — ABNORMAL HIGH (ref 70–99)
Potassium: 4.3 mmol/L (ref 3.5–5.1)
Sodium: 135 mmol/L (ref 135–145)

## 2019-05-28 LAB — CBC
HCT: 25.2 % — ABNORMAL LOW (ref 39.0–52.0)
Hemoglobin: 7.6 g/dL — ABNORMAL LOW (ref 13.0–17.0)
MCH: 27.9 pg (ref 26.0–34.0)
MCHC: 30.2 g/dL (ref 30.0–36.0)
MCV: 92.6 fL (ref 80.0–100.0)
Platelets: 128 10*3/uL — ABNORMAL LOW (ref 150–400)
RBC: 2.72 MIL/uL — ABNORMAL LOW (ref 4.22–5.81)
RDW: 17.2 % — ABNORMAL HIGH (ref 11.5–15.5)
WBC: 5.6 10*3/uL (ref 4.0–10.5)
nRBC: 0 % (ref 0.0–0.2)

## 2019-05-28 LAB — HEPARIN LEVEL (UNFRACTIONATED)
Heparin Unfractionated: 0.47 IU/mL (ref 0.30–0.70)
Heparin Unfractionated: 0.4 IU/mL (ref 0.30–0.70)

## 2019-05-28 LAB — MAGNESIUM: Magnesium: 2.5 mg/dL — ABNORMAL HIGH (ref 1.7–2.4)

## 2019-05-28 LAB — GLUCOSE, CAPILLARY
Glucose-Capillary: 205 mg/dL — ABNORMAL HIGH (ref 70–99)
Glucose-Capillary: 231 mg/dL — ABNORMAL HIGH (ref 70–99)
Glucose-Capillary: 218 mg/dL — ABNORMAL HIGH (ref 70–99)
Glucose-Capillary: 256 mg/dL — ABNORMAL HIGH (ref 70–99)
Glucose-Capillary: 242 mg/dL — ABNORMAL HIGH (ref 70–99)
Glucose-Capillary: 228 mg/dL — ABNORMAL HIGH (ref 70–99)

## 2019-05-28 LAB — PHOSPHORUS: Phosphorus: 2.8 mg/dL (ref 2.5–4.6)

## 2019-05-28 MED ORDER — LOSARTAN POTASSIUM 25 MG PO TABS
12.5000 mg | ORAL_TABLET | Freq: Every day | ORAL | Status: DC
  Filled 2019-05-28: qty 0.5

## 2019-05-28 MED ORDER — TORSEMIDE 20 MG PO TABS
20.0000 mg | ORAL_TABLET | Freq: Every day | ORAL | Status: DC
  Administered 2019-05-28: 18:00:00 20 mg via ORAL
  Filled 2019-05-28: qty 1

## 2019-05-28 MED ORDER — SIMVASTATIN 20 MG PO TABS
20.0000 mg | ORAL_TABLET | Freq: Every morning | ORAL | Status: DC
  Administered 2019-05-28 – 2019-05-29 (×2): 20 mg via ORAL
  Filled 2019-05-28 (×2): qty 1

## 2019-05-28 MED ORDER — VILAZODONE HCL 40 MG PO TABS
40.0000 mg | ORAL_TABLET | Freq: Every day | ORAL | Status: DC
  Administered 2019-05-28 – 2019-06-03 (×7): 40 mg via ORAL
  Filled 2019-05-28 (×8): qty 1

## 2019-05-28 MED ORDER — SODIUM CHLORIDE 0.9% FLUSH
10.0000 mL | Freq: Two times a day (BID) | INTRAVENOUS | Status: DC
  Administered 2019-05-28: 20 mL
  Administered 2019-05-28 – 2019-05-31 (×6): 10 mL
  Administered 2019-05-31: 20 mL
  Administered 2019-06-01 – 2019-06-10 (×19): 10 mL
  Administered 2019-06-10: 40 mL
  Administered 2019-06-11 – 2019-06-14 (×8): 10 mL
  Administered 2019-06-15: 20 mL
  Administered 2019-06-15 – 2019-06-17 (×6): 10 mL

## 2019-05-28 MED ORDER — CARVEDILOL 3.125 MG PO TABS
3.1250 mg | ORAL_TABLET | Freq: Two times a day (BID) | ORAL | Status: DC
  Administered 2019-05-28 – 2019-05-30 (×5): 3.125 mg
  Filled 2019-05-28 (×4): qty 1

## 2019-05-28 MED ORDER — LOSARTAN POTASSIUM 25 MG PO TABS
12.5000 mg | ORAL_TABLET | Freq: Every day | ORAL | Status: DC
  Administered 2019-05-28 – 2019-05-30 (×3): 12.5 mg
  Filled 2019-05-28 (×2): qty 0.5

## 2019-05-28 MED ORDER — SODIUM CHLORIDE 0.9% FLUSH: 10.0000 mL | INTRAVENOUS | Status: DC | PRN

## 2019-05-28 MED ORDER — CARVEDILOL 3.125 MG PO TABS
3.1250 mg | ORAL_TABLET | Freq: Two times a day (BID) | ORAL | Status: DC
  Filled 2019-05-28: qty 1

## 2019-05-28 MED ORDER — INSULIN DETEMIR 100 UNIT/ML ~~LOC~~ SOLN
40.0000 [IU] | Freq: Two times a day (BID) | SUBCUTANEOUS | Status: DC
  Administered 2019-05-28 – 2019-06-05 (×17): 40 [IU] via SUBCUTANEOUS
  Filled 2019-05-28 (×19): qty 0.4

## 2019-05-28 NOTE — Progress Notes (Signed)
PCCM to [facility] transfer:  Patient with a history of atrial fibrillation on chronic anticoagulation with Xarelto, CAD, history of endocarditis, left bundle branch block, history of mitral valve replacement, chronic systolic heart failure, SVT, COPD, chronic venous insufficiency, and hypertension who presented on 10/29 after a fall.  He was found to have an unstable fracture of his lumbar spine but was also found to be positive for (asymptomatic) COVID-19 infection.   While waiting for surgical clearance (increased risk of complications in the setting of surgery with COVID infection) and developed GBS bacteremia, now off antibiotics.  He had multilevel spinal fusion on 11/17.  He was difficult to ventilate in the OR and so kept intubated overnight.  Patient was previously on the hospitalist service.   Currently on RA.  He is quite deconditioned due to prolonged hospitalization.  He will need aggressive pulmonary toilet.  PT is seeing him.  Neurosurgery is following and he has a drain in place, but he is stable from this standpoint.  He is likely to need rehab - CIR vs. SNF, likely too early to tell which.  TRH will resume care of the patient on 11/21.   Carlyon Shadow, M.D.

## 2019-05-28 NOTE — Progress Notes (Signed)
ANTICOAGULATION CONSULT NOTE  Pharmacy Consult for Heparin Indication: atrial fibrillation   Patient Measurements: Height: 5\' 9"  (175.3 cm) Weight: 245 lb 6 oz (111.3 kg) IBW/kg (Calculated) : 70.7 Heparin Dosing Weight: 98 kg  Vital Signs: Temp: 98.8 F (37.1 C) (11/20 0400) Temp Source: Axillary (11/20 0400) BP: 107/61 (11/20 0630) Pulse Rate: 74 (11/20 0630)  Labs: Recent Labs    05/25/19 1323  05/26/19 0459 05/27/19 0902 05/27/19 0911 05/27/19 1744 05/27/19 1931 05/28/19 0511  HGB  --    < > 8.5* 8.5* 8.1*  --   --  7.6*  HCT  --    < > 27.3* 25.0* 27.1*  --   --  25.2*  PLT  --   --  169  --  131*  --   --  128*  HEPARINUNFRC  --   --   --   --   --  1.74* 0.24* 0.47  CREATININE  --   --  0.60*  --  0.77  --   --  0.70  CKTOTAL 136  --   --   --   --   --   --   --   CKMB 1.3  --   --   --   --   --   --   --    < > = values in this interval not displayed.     Assessment: 68 year old male on Xarelto prior to admission for atrial fibrillation who presented post fall and was transitioned to Heparin IV due to need for possible lumbar surgery on 11/17.   Pt is now POD#2 for spinal surgery and heparin was restarted today  Initial hep lvl was 1.74 - but was drawn through PICC with hep infusing  Repeat with heparin peripheral now and drawn through PICC is 0.24  11/20 AM update:  Heparin level therapeutic   Goal of Therapy:  Heparin level 0.3-0.7 units/ml Monitor platelets by anticoagulation protocol: Yes  Plan:  Cont heparin at 2450 units/hr Confirmatory heparin level at [place], PharmD, [place] Pharmacist Phone: [phone]

## 2019-05-28 NOTE — Progress Notes (Addendum)
  NEUROSURGERY PROGRESS NOTE   No issues overnight.  "I feel fine"  EXAM:  BP (!) 96/48   Pulse 76   Temp 98.7 F (37.1 C) (Axillary)   Resp 13   Ht 5\' 9"  (1.753 m)   Wt 111.3 kg   SpO2 98%   BMI 36.24 kg/m   Ill appearing, awake Speech slow Generalized weakness BLE, nonfocal Incision: dried blood JP drain in place  IMPRESSION/PLAN 68 y.o. male pod #3 T11-L4 fusion for L1 chance fracture. Generalized weakness, but nonfocal - continue JP drain for at least one more day due to heparin. If remains neuro stable, can d/c drain tomorrow - up with brace

## 2019-05-28 NOTE — Progress Notes (Signed)
NAMEApurva Hale, MRN:  [id number], DOB:  12/29/1950, LOS: 22 ADMISSION DATE:  05/06/2019, CONSULTATION DATE: 05/25/2019 REFERRING MD: [name]-neurosurgery, CHIEF COMPLAINT: Respiratory failure  Brief History   68 year old man who underwent multilevel spinal fusion today.  Difficult to ventilate in the operating room so was kept intubated and admitted to the intensive care unit for ongoing management.  History of present illness   11/17 underwent open reduction internal fixation of L1-L2 Chance fracture with multilevel spine stabilization from T11-L4. In prone position for most of the surgery, difficult to ventilate requiring respiratory rate of 25 x 8 mL/kg to achieve an end-tidal CO2 of 52.  pH was acidotic.  No improvement with bronchodilators. Possible ST-T wave changes intraoperatively with no change in position.  Minimal urine output.  Extensive past medical history with complicated recent past: He presented 10/29 with back pain after mechanical fall and was found to have an unstable lumbar fracture.  Course in hospital complicated due to asymptomatic COVID-19 infection and GBS bacteremia.  Both have resolved and he was brought to surgery today.  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  Past Medical History   Past Medical History:  Diagnosis Date  . A-fib (HCC)   . Anxiety   . Atrial fibrillation (HCC)   . Cellulitis   . CHF (congestive heart failure) (HCC)   . Chronic venous insufficiency   . COPD (chronic obstructive pulmonary disease) (HCC)   . Coronary atherosclerosis of native coronary artery    Mild nonobstructive 08/2012  . Degenerative joint disease   . Diabetes mellitus, type II (HCC)    Gastroparesis; GI care at [facility]  . 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 s/p zyvox rx.  (Cultures never positive); left bundle  branch block; H/o SVT; 09/2012: bioprosthetic MVR at [facility]; a. Severe dental caries and cavities s/p multiple extractions.  . Essential hypertension, benign   . Gastroparesis   . 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 Left bundle branch block   . Left leg cellulitis   . Major depressive disorder, recurrent severe without psychotic features (HCC)    [facility] admission 12/2012  . Morbid obesity (HCC) 06/27/2012  . Nephrolithiasis   . PSVT (paroxysmal supraventricular tachycardia) (HCC)    [facility]-op at [facility]  . Sleep apnea    a. uses CPAP nightly  . Urinary tract infection 08/30/2012   Proteus mirabilis    Significant Hospital Events   10/29 admitted for back pain  10/29 positive Covid test-asymptomatic GBS bacteremia, TEE negative for endocarditis.  Completed antibiotics on 1116 Mild exacerbation of COPD requiring diuresis 11/12 Multilevel spinal stabilization 11/17. Consults:  Neurosurgery Critical care  Procedures:  11/17: PROCEDURE: 1. Open reduction, internal fixation L1-2 Chance fracture 2. Posterior segmental instrumented pedicle screw stabilization, T11-L4 3. Posterolateral arthrodesis, T11-L4 Use of non-structural morcellized bone allograft - DBX 05/25/2019 intubation in OR per anesthesia>>extubated 11/18 Significant Diagnostic Tests:    Micro Data:  11/2-group G Streptococcus in blood.  Antimicrobials:  Penicillin G 11/3-11/17  Interim history/subjective:  Extubated on 05/27/2019.  Noted to be somnolent most likely from oxycodone.  Objective   Blood pressure (!) 96/51, pulse 75, temperature 98.7 F (37.1 C), temperature source Axillary, resp. rate 18, height 5\' 9"  (1.753 m), weight 111.3 kg, SpO2 98 %.    FiO2 (%):  [28 %] 28 %   Intake/Output Summary (Last 24  hours) at 05/28/2019 0800 Last data filed at 05/28/2019 0700 Gross per 24 hour  Intake 1891.69 ml  Output 1810 ml  Net 81.69 ml   Filed Weights   05/26/19 0500  05/27/19 0500 05/28/19 0500  Weight: 113.7 kg 113.5 kg 111.3 kg    Examination: General: Chronically ill obese male lying in bed.   HEENT: No JVD or lymphadenopathy is appreciated Neuro: Awake and following commands but diffusely weak. CV: Heart sounds are regular PULM: Congested cough with bilateral rhonchi. GI: soft, bsx4 active  Extremities: warm/dry, lower extremities with 1-2+ edema back with Jackson-Pratt minimal bloody drainage Skin: no rashes or lesions  Resolved Hospital Problem list   Group B strep bacteremia  Assessment & Plan:  Was critically ill due to hypercarbic respiratory failure prior mechanical ventilation.  Complicated by underlying COPD prone ventilation restrictive component during surgery. Status post multilevel spinal surgery. Atrial fibrillation on Xarelto at home. COPD Chronic mixed heart failure Ankylosing spondylitis Poorly controlled diabetes mellitus  Pulmonary toilet remains critical. Mobilization will be slow due to prolonged deconditioning. However ready to transition to PCU.   Daily Goals Checklist  Pain/Anxiety/Delirium protocol (if indicated): oxycodone 5 mg every 4 hours prn. VAP protocol (if indicated): Not applicable Respiratory support goals: Pulmonary toilet. Incentive spirometry at least every hour Blood pressure target: Less than 683 systolic DVT prophylaxis:  resumed IV heparin, consider switch to Xarelto once drains are out. Nutritional status and feeding goals: N.p.o. swallow evaluation and enteral nutrition post extubation GI prophylaxis: Pantoprazole Fluid status goals: resume maintenance dose of Lasix Urinary catheter: trial of voiding. Central lines: Right double-lumen PICC placed 05/27/2019 - continue for poor access Glucose control: Still hyperglycemic, will increase Levemir to 40 bid. Mobility/therapy needs: PT as per stroke rehabilitation Antibiotic de-escalation: Perioperative antibiotics only Home medication  reconciliation: resume home heart failure medications. Daily labs: CBC and BMP daily Code Status: Full code Family Communication:  Disposition: transfer to PCU, TRH contacted and orders reconciled.   Labs   CBC: Recent Labs  Lab 05/25/19 0415  05/25/19 1231 05/25/19 1501 05/26/19 0459 05/27/19 0902 05/27/19 0911 05/28/19 0511  WBC 8.3  --  15.2*  --  11.9*  --  7.0 5.6  NEUTROABS  --   --   --   --   --   --  5.8  --   HGB 9.1*   < > 9.3* 8.8* 8.5* 8.5* 8.1* 7.6*  HCT 28.5*   < > 29.9* 26.0* 27.3* 25.0* 27.1* 25.2*  MCV 86.4  --  90.1  --  88.9  --  92.2 92.6  PLT 189  --  241  --  169  --  131* 128*   < > = values in this interval not displayed.    Basic Metabolic Panel: Recent Labs  Lab 05/24/19 1808 05/25/19 0415 05/25/19 1001  05/25/19 1220 05/25/19 1501 05/26/19 0459 05/27/19 0902 05/27/19 0911 05/28/19 0511  NA 128* 129* 125*   < > 127* 126* 130* 132* 131* 135  K 5.1 4.8 5.5*   < > 5.8* 5.4* 4.7 4.3 4.3 4.3  CL 87* 88* 90*  --  93*  --  96*  --  97* 98  CO2 29 29  --   --   --   --  24  --  28 28  GLUCOSE 292* 184* 242*  --  312*  --  140*  --  315* 244*  BUN 19 20 23   --  26*  --  20  --  34* 39*  CREATININE 0.84 0.74 0.70  --  0.90  --  0.60*  --  0.77 0.70  CALCIUM 8.0* 8.2*  --   --   --   --  7.8*  --  7.7* 7.8*  MG  --   --   --   --   --   --   --   --  2.3 2.5*  PHOS  --   --   --   --   --   --   --   --  3.4 2.8   < > = values in this interval not displayed.   GFR: Estimated Creatinine Clearance: 108.6 mL/min (by C-G formula based on SCr of 0.7 mg/dL). Recent Labs  Lab 05/25/19 1231 05/26/19 0459 05/27/19 0911 05/28/19 0511  WBC 15.2* 11.9* 7.0 5.6    Liver Function Tests: No results for input(s): AST, ALT, ALKPHOS, BILITOT, PROT, ALBUMIN in the last 168 hours. No results for input(s): LIPASE, AMYLASE in the last 168 hours. No results for input(s): AMMONIA in the last 168 hours.  ABG    Component Value Date/Time   PHART 7.347 (L)  05/27/2019 0902   PCO2ART 52.5 (H) 05/27/2019 0902   PO2ART 104.0 05/27/2019 0902   HCO3 28.8 (H) 05/27/2019 0902   TCO2 30 05/27/2019 0902   ACIDBASEDEF 1.7 03/02/2018 2230   O2SAT 98.0 05/27/2019 0902     Coagulation Profile: No results for input(s): INR, PROTIME in the last 168 hours.  Cardiac Enzymes: Recent Labs  Lab 05/25/19 1323  CKTOTAL 136  CKMB 1.3    HbA1C: Hgb A1c MFr Bld  Date/Time Value Ref Range Status  03/24/2019 02:13 PM 7.4 (H) 4.6 - 6.5 % Final    Comment:    Glycemic Control Guidelines for People with Diabetes:Non Diabetic:  <6%Goal of Therapy: <7%Additional Action Suggested:  >8%   12/31/2018 12:19 PM 8.3 (H) 4.6 - 6.5 % Final    Comment:    Glycemic Control Guidelines for People with Diabetes:Non Diabetic:  <6%Goal of Therapy: <7%Additional Action Suggested:  >8%     CBG: Recent Labs  Lab 05/27/19 1626 05/27/19 1932 05/27/19 2330 05/28/19 0348 05/28/19 0741  GLUCAP 199* 184* 218* 205* 231*        App cct 30 min  Brett Canales Minor ACNP [facility] PCCM

## 2019-05-28 NOTE — Progress Notes (Signed)
Physical Therapy Treatment Patient Details Name: Cody Hale MRN: [id number] DOB: 1951/05/15 Today's Date: 05/28/2019    History of Present Illness Pt is a 68 yo male presented 05/06/19 to ED after fall  (he tripped over some blankets), onto his buttocks with severe back pain;  CT lumbar spine showing unstable fracture L1-2 and likely ankylosing spondylitis; tested +COVID and developed fever and required TEE to r/o endocarditis prior to surgery;  11/17 underwent open reduction internal fixation of L1-L2 Chance fracture with multilevel spine stabilization from T11-L4. Remained intubated until 07/25/18 PMHx: Afib, MVR, SVT, anxiety, CAD, COPD, CHF, HTN,     PT Comments    Pt was agreeable to any mobility.  TLSO difficult to donn and pt rolling extensively to get the brace centered.  Emphasized transition to sitting EOB, balancing EOB with/without UE assist and sit to stand x3 to position padding to help return to sidelying and reposition in bed.    Follow Up Recommendations  Other (comment)(post acute therapies, will work toward CIR level)     Equipment Recommendations  Other (comment)(TBD)    Recommendations for Other Services       Precautions / Restrictions Precautions Precautions: Back;Fall Precaution Booklet Issued: No Precaution Comments: Educated pt on no bending or twisting; very lethargic Required Braces or Orthoses: Spinal Brace Spinal Brace: Thoracolumbosacral orthotic;Applied in supine position    Mobility  Bed Mobility Overal bed mobility: Needs Assistance Bed Mobility: Rolling;Sidelying to Sit;Sit to Sidelying Rolling: Total assist;Max assist;+2 for physical assistance Sidelying to sit: Total assist;+2 for physical assistance     Sit to sidelying: Total assist;+2 for physical assistance;Max assist General bed mobility comments: practiced rolling extensively while donning and doffing the brace in supine.  Transfers Overall transfer level: Needs assistance    Transfers: Sit to/from Stand Sit to Stand: Max assist;+2 safety/equipment         General transfer comment: 3 face to face assisted sit to stands with 3 stand/scoots up toward [facility] before returning to sidelying.  Ambulation/Gait             General Gait Details: unable   Stairs             Wheelchair Mobility    Modified Rankin (Stroke Patients Only)       Balance                                            Cognition Arousal/Alertness: Awake/alert Behavior During Therapy: Flat affect Overall Cognitive Status: (NT formally, likely functional)                                        Exercises Other Exercises Other Exercises: ROM exercise with resisted gross extension and graded assist into flexion.    General Comments General comments (skin integrity, edema, etc.): pt reinforced in back care/prec, donning the brace, log roll, lifting restrictions and progression of activity.      Pertinent Vitals/Pain Pain Assessment: Faces Faces Pain Scale: Hurts even more Pain Location: legs L >R Pain Descriptors / Indicators: Discomfort;Grimacing Pain Intervention(s): Monitored during session;Limited activity within patient's tolerance    Home Living                      Prior Function  PT Goals (current goals can now be found in the care plan section) Acute Rehab PT Goals Patient Stated Goal: get out of this bed PT Goal Formulation: With patient Time For Goal Achievement: 06/09/19 Potential to Achieve Goals: Good Progress towards PT goals: Progressing toward goals    Frequency    Min 5X/week      PT Plan Current plan remains appropriate    Co-evaluation              AM-PAC PT "6 Clicks" Mobility   Outcome Measure  Help needed turning from your back to your side while in a flat bed without using bedrails?: Total Help needed moving from lying on your back to sitting on the side of a flat  bed without using bedrails?: Total Help needed moving to and from a bed to a chair (including a wheelchair)?: Total Help needed standing up from a chair using your arms (e.g., wheelchair or bedside chair)?: Total Help needed to walk in hospital room?: Total Help needed climbing 3-5 steps with a railing? : Total 6 Click Score: 6    End of Session Equipment Utilized During Treatment: Back brace Activity Tolerance: Patient tolerated treatment well   Nurse Communication: Mobility status PT Visit Diagnosis: Other abnormalities of gait and mobility (R26.89);Muscle weakness (generalized) (M62.81);Pain;History of falling (Z91.81) Pain - Right/Left: (bil) Pain - part of body: (legs, knees, back)     Time: 7035-0093 PT Time Calculation (min) (ACUTE ONLY): 34 min  Charges:  $Therapeutic Activity: 23-37 mins                     05/28/2019  Winfield Bing, PT Acute Rehabilitation Services [phone]  (pager) [phone]  (office)   Eliseo Gum Evelise Reine 05/28/2019, 6:41 PM

## 2019-05-28 NOTE — Progress Notes (Signed)
ANTICOAGULATION CONSULT NOTE  Pharmacy Consult for Heparin Indication: atrial fibrillation   Patient Measurements: Height: 5\' 9"  (175.3 cm) Weight: 245 lb 6 oz (111.3 kg) IBW/kg (Calculated) : 70.7 Heparin Dosing Weight: 98 kg  Vital Signs: Temp: 98.7 F (37.1 C) (11/20 0745) Temp Source: Axillary (11/20 0745) BP: 108/60 (11/20 1200) Pulse Rate: 70 (11/20 1300)  Labs: Recent Labs    05/26/19 0459 05/27/19 0902 05/27/19 0911  05/27/19 1931 05/28/19 0511 05/28/19 1230  HGB 8.5* 8.5* 8.1*  --   --  7.6*  --   HCT 27.3* 25.0* 27.1*  --   --  25.2*  --   PLT 169  --  131*  --   --  128*  --   HEPARINUNFRC  --   --   --    < > 0.24* 0.47 0.40  CREATININE 0.60*  --  0.77  --   --  0.70  --    < > = values in this interval not displayed.     Assessment: 68 year old male on Xarelto prior to admission for atrial fibrillation who presented post fall and was transitioned to Heparin IV due to need for possible lumbar surgery on 11/17.   Pt is now POD#2 for spinal surgery and heparin was restarted today  Initial hep lvl was 1.74 - but was drawn through PICC with hep infusing  Repeat with heparin peripheral now and drawn through PICC is 0.24  Confirmatory heparin level remains therapeutic  Goal of Therapy:  Heparin level 0.3-0.7 units/ml Monitor platelets by anticoagulation protocol: Yes  Plan:  Continue heparin at 2450 units/hr Daily heparin level, CBC, s/s bleeding  Bertis Ruddy, PharmD Clinical Pharmacist Please check AMION for all [place] numbers 05/28/2019 1:29 PM

## 2019-05-29 DIAGNOSIS — R7881 Bacteremia: Secondary | ICD-10-CM | POA: Diagnosis not present

## 2019-05-29 LAB — GLUCOSE, CAPILLARY
Glucose-Capillary: 224 mg/dL — ABNORMAL HIGH (ref 70–99)
Glucose-Capillary: 167 mg/dL — ABNORMAL HIGH (ref 70–99)
Glucose-Capillary: 262 mg/dL — ABNORMAL HIGH (ref 70–99)
Glucose-Capillary: 261 mg/dL — ABNORMAL HIGH (ref 70–99)
Glucose-Capillary: 201 mg/dL — ABNORMAL HIGH (ref 70–99)
Glucose-Capillary: 195 mg/dL — ABNORMAL HIGH (ref 70–99)

## 2019-05-29 LAB — BASIC METABOLIC PANEL
Anion gap: 8 (ref 5–15)
BUN: 38 mg/dL — ABNORMAL HIGH (ref 8–23)
CO2: 31 mmol/L (ref 22–32)
Calcium: 8 mg/dL — ABNORMAL LOW (ref 8.9–10.3)
Chloride: 101 mmol/L (ref 98–111)
Creatinine, Ser: 0.57 mg/dL — ABNORMAL LOW (ref 0.61–1.24)
GFR calc Af Amer: >60 mL/min (ref >60)
GFR calc non Af Amer: >60 mL/min (ref >60)
Glucose, Bld: 215 mg/dL — ABNORMAL HIGH (ref 70–99)
Potassium: 4.3 mmol/L (ref 3.5–5.1)
Sodium: 140 mmol/L (ref 135–145)

## 2019-05-29 LAB — TYPE AND SCREEN
ABO/RH(D): O POS
Antibody Screen: NEGATIVE
Unit division: 0
Unit division: 0

## 2019-05-29 LAB — CBC WITH DIFFERENTIAL/PLATELET
Abs Immature Granulocytes: 0.03 10*3/uL (ref 0.00–0.07)
Basophils Absolute: 0 10*3/uL (ref 0.0–0.1)
Basophils Relative: 0 %
Eosinophils Absolute: 0.1 10*3/uL (ref 0.0–0.5)
Eosinophils Relative: 1 %
HCT: 26.7 % — ABNORMAL LOW (ref 39.0–52.0)
Hemoglobin: 7.9 g/dL — ABNORMAL LOW (ref 13.0–17.0)
Immature Granulocytes: 0 %
Lymphocytes Relative: 6 %
Lymphs Abs: 0.4 10*3/uL — ABNORMAL LOW (ref 0.7–4.0)
MCH: 27.7 pg (ref 26.0–34.0)
MCHC: 29.6 g/dL — ABNORMAL LOW (ref 30.0–36.0)
MCV: 93.7 fL (ref 80.0–100.0)
Monocytes Absolute: 0.6 10*3/uL (ref 0.1–1.0)
Monocytes Relative: 8 %
Neutro Abs: 5.6 10*3/uL (ref 1.7–7.7)
Neutrophils Relative %: 85 %
Platelets: 167 10*3/uL (ref 150–400)
RBC: 2.85 MIL/uL — ABNORMAL LOW (ref 4.22–5.81)
RDW: 17.8 % — ABNORMAL HIGH (ref 11.5–15.5)
WBC: 6.7 10*3/uL (ref 4.0–10.5)
nRBC: 0 % (ref 0.0–0.2)

## 2019-05-29 LAB — BPAM RBC
Blood Product Expiration Date: 202012222359
Blood Product Expiration Date: 202012222359
Unit Type and Rh: 5100
Unit Type and Rh: 5100

## 2019-05-29 LAB — CBC
HCT: 25.1 % — ABNORMAL LOW (ref 39.0–52.0)
Hemoglobin: 7.4 g/dL — ABNORMAL LOW (ref 13.0–17.0)
MCH: 27.6 pg (ref 26.0–34.0)
MCHC: 29.5 g/dL — ABNORMAL LOW (ref 30.0–36.0)
MCV: 93.7 fL (ref 80.0–100.0)
Platelets: 146 10*3/uL — ABNORMAL LOW (ref 150–400)
RBC: 2.68 MIL/uL — ABNORMAL LOW (ref 4.22–5.81)
RDW: 17.4 % — ABNORMAL HIGH (ref 11.5–15.5)
WBC: 4.4 10*3/uL (ref 4.0–10.5)
nRBC: 0 % (ref 0.0–0.2)

## 2019-05-29 LAB — HEPARIN LEVEL (UNFRACTIONATED): Heparin Unfractionated: 0.5 IU/mL (ref 0.30–0.70)

## 2019-05-29 MED ORDER — TAMSULOSIN HCL 0.4 MG PO CAPS
0.4000 mg | ORAL_CAPSULE | Freq: Every day | ORAL | Status: DC
  Administered 2019-05-29: 13:00:00 0.4 mg via ORAL
  Filled 2019-05-29: qty 1

## 2019-05-29 MED ORDER — TORSEMIDE 20 MG PO TABS
20.0000 mg | ORAL_TABLET | Freq: Every day | ORAL | Status: DC
  Administered 2019-05-29 – 2019-06-04 (×7): 20 mg
  Filled 2019-05-29 (×7): qty 1

## 2019-05-29 MED ORDER — SIMVASTATIN 20 MG PO TABS
20.0000 mg | ORAL_TABLET | Freq: Every morning | ORAL | Status: DC
  Administered 2019-05-30 – 2019-06-18 (×20): 20 mg
  Filled 2019-05-29 (×20): qty 1

## 2019-05-29 NOTE — Progress Notes (Addendum)
PROGRESS NOTE  Cody Hale KGM:[id number] DOB: Mar 31, 1951 DOA: 05/06/2019 PCP: [name], MD  HPI/Recap of past 24 hours: Cody Hale is a 68 y.o. 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  Patient fell onto his buttock.  Started hurting and was brought to the ER.  Denies any incontinence of urine or bowel.  Patient's pain has been significant.  ED Course: In the ER at [facility] MRI showed unstable lumbar fracture and patient was transferred to [facility].  Patient had Covid test done which was positive.  Patient otherwise is denying any shortness of breath has been a some productive cough chest x-ray was showing cardiomegaly and nothing acute.  Other labs show BNP of 326 LDH 331 CRP 14.7 WBC 11.9 hemoglobin 13.8 platelets 104 creatinine 1.3.  Neurosurgery has been consulted patient is being admitted for further management of lumbar fracture with COVID-19 being positive.     Brief interval history:Patient with a history of atrial fibrillation on chronic anticoagulation with Xarelto, CAD, history of endocarditis, left bundle branch block, history of mitral valve replacement, chronic systolic heart failure, SVT, COPD, chronic venous insufficiency, and hypertension who presented on 10/29 after a fall.  He was found to have an unstable fracture of his lumbar spine but was also found to be positive for (asymptomatic) COVID-19 infection.   While waiting for surgical clearance (increased risk of complications in the setting of surgery with COVID infection) and developed GBS bacteremia, now off antibiotics.  He had multilevel spinal fusion on 11/17.  He was difficult to ventilate in the OR and so kept intubated overnight.  Patient was previously on the hospitalist service.   Currently on RA.  He  is quite deconditioned due to prolonged hospitalization.  He will need aggressive pulmonary toilet.  PT is seeing him.  Neurosurgery is following and he has a drain in place, but he is stable from this standpoint.  He is likely to need rehab - CIR vs. SNF, likely too early to tell which.  TRH will resume care of the patient on 11/21.  05/29/19: Patient was seen and examined at his bedside this morning.  Reports persistent nonproductive cough and back pain.  States breathing is improved.  Denies any chest pain.    Assessment/Plan: Active Problems:   OSA (obstructive sleep apnea)   Type II diabetes mellitus with manifestations (HCC)   Chronic systolic CHF (congestive heart failure) (HCC)   Hyperlipidemia with target LDL less than 70   Chronic atrial fibrillation   Chronic respiratory failure with hypoxia (HCC)   Unstable burst fracture of unspecified lumbar vertebra, initial encounter for closed fracture (HCC)   Closed unstable burst fracture of lumbar vertebra (HCC)   Closed unstable burst fracture of first lumbar vertebra (HCC)   Pressure injury of skin   Bacteremia   Hyponatremia  POD #5 post T11-L4 fusion for L1 Chance fracture. Neurosurgery following Up with brace per neurosurgery Pain management in place  Acute hypoxic hypercarbic respiratory failure requiring invasive mechanical ventilation likely secondary to acute COPD exacerbation Not On oxygen supplementation at baseline Extubated on 05/26/2019 Currently on nasal cannula 3 L with O2 saturation in the upper 90s Continue pulmonary toilet as recommended by PCCM  Acute urinary retention Unable to void with >400cc from bladder scan Insert foley cath Start flomax Closely monitor  UO  Chronic A. fib Continue Coreg for rate control Continue heparin drip for CVA prevention On Xarelto at home, switch back to Xarelto once no procedures are planned  Streptococcous Group G bacteremia, poa Blood cx x 2 positive on 05/10/19.  Repeat blood cx x2 done on 05/11/19 staph coag neg x1 (likely contaminant) and other bottle negative growth final. Completed course of treatment Afebrile, no leukocytosis  Acute COPD exacerbation Continue pulmonary toilet Independently viewed chest x-ray which shows mild bibasilar atelectasis. Maintain O2 saturation greater than 92% management as stated above  Chronic systolic CHF Last 2D echo showed LVEF 40 to 45% on 05/20/2019. Continue strict I's and O's and daily weight Continue current medication: On Coreg 3.125 mg twice daily, losartan 12.5 mg daily,Torsemide 20 mg daily  GERD Continue PPI  Hyperlipidemia Continue statin  Dysphagia, coretrack in place Continue aspiration precautions N.p.o. for now Nutrition and medications via NG tube Appreciate speech therapy for evaluation and recommendations  History of COVID-19 infection COVID-19 test positive on 05/06/2019. Afebrile   Code Status: Full code  Family Communication: None at bedside.  Disposition Plan: Pending PT OT evaluation and speech therapist reevaluation   Consultants:  Neurosurgery  PCCM  Procedures: 11/17: PROCEDURE: 2. Open reduction, internal fixation L1-2 Chance fracture 3. Posterior segmental instrumented pedicle screw stabilization, T11-L4 4. Posterolateral arthrodesis, T11-L4 Use of non-structural morcellized bone allograft - DBX 05/25/2019 intubation in OR per anesthesia>>extubated 11/18 Antimicrobials: Penicillin G 11/3-11/17  Significant Hospital Events   10/29 admitted for back pain  10/29 positive Covid test-asymptomatic GBS bacteremia, TEE negative for endocarditis.  Completed antibiotics on 1116 Mild exacerbation of COPD requiring diuresis 11/12 Multilevel spinal stabilization 11/17.   DVT prophylaxis: Heparin drip   Objective: Vitals:   05/29/19 0630 05/29/19 0700 05/29/19 0757 05/29/19 0800  BP: 91/65 (!) 88/54    Pulse: 76 (!) 53    Resp: (!) 22 19    Temp:    98  F (36.7 C)  TempSrc:    Axillary  SpO2: 96% 92% 100%   Weight:      Height:        Intake/Output Summary (Last 24 hours) at 05/29/2019 0953 Last data filed at 05/29/2019 0700 Gross per 24 hour  Intake 2001.57 ml  Output 1225 ml  Net 776.57 ml   Filed Weights   05/27/19 0500 05/28/19 0500 05/29/19 0500  Weight: 113.5 kg 111.3 kg 113.6 kg    Exam:   General: 68 y.o. year-old male well developed well nourished in no acute distress.  Alert and oriented x3.  Cardiovascular: Irregular rate and rhythm with no rubs or gallops.  No thyromegaly or JVD noted.    Respiratory: Clear to auscultation with no wheezes or rales. Good inspiratory effort.  Abdomen: Soft nontender nondistended with normal bowel sounds x4 quadrants.  Musculoskeletal: Trace lower extremity edema.  Psychiatry: Mood is appropriate for condition and setting   Data Reviewed: CBC: Recent Labs  Lab 05/25/19 1231  05/26/19 0459 05/27/19 0902 05/27/19 0911 05/28/19 0511 05/29/19 0558  WBC 15.2*  --  11.9*  --  7.0 5.6 4.4  NEUTROABS  --   --   --   --  5.8  --   --   HGB 9.3*   < > 8.5* 8.5* 8.1* 7.6* 7.4*  HCT 29.9*   < > 27.3* 25.0* 27.1* 25.2* 25.1*  MCV 90.1  --  88.9  --  92.2 92.6 93.7  PLT 241  --  169  --  131* 128* 146*   < > = values in this interval not displayed.   Basic Metabolic Panel: Recent Labs  Lab 05/25/19 0415  05/25/19 1220  05/26/19 0459 05/27/19 0902 05/27/19 0911 05/28/19 0511 05/29/19 0558  NA 129*   < > 127*   < > 130* 132* 131* 135 140  K 4.8   < > 5.8*   < > 4.7 4.3 4.3 4.3 4.3  CL 88*   < > 93*  --  96*  --  97* 98 101  CO2 29  --   --   --  24  --  28 28 31   GLUCOSE 184*   < > 312*  --  140*  --  315* 244* 215*  BUN 20   < > 26*  --  20  --  34* 39* 38*  CREATININE 0.74   < > 0.90  --  0.60*  --  0.77 0.70 0.57*  CALCIUM 8.2*  --   --   --  7.8*  --  7.7* 7.8* 8.0*  MG  --   --   --   --   --   --  2.3 2.5*  --   PHOS  --   --   --   --   --   --  3.4 2.8  --      < > = values in this interval not displayed.   GFR: Estimated Creatinine Clearance: 109.9 mL/min (A) (by C-G formula based on SCr of 0.57 mg/dL (L)). Liver Function Tests: No results for input(s): AST, ALT, ALKPHOS, BILITOT, PROT, ALBUMIN in the last 168 hours. No results for input(s): LIPASE, AMYLASE in the last 168 hours. No results for input(s): AMMONIA in the last 168 hours. Coagulation Profile: No results for input(s): INR, PROTIME in the last 168 hours. Cardiac Enzymes: Recent Labs  Lab 05/25/19 1323  CKTOTAL 136  CKMB 1.3   BNP (last 3 results) No results for input(s): PROBNP in the last 8760 hours. HbA1C: No results for input(s): HGBA1C in the last 72 hours. CBG: Recent Labs  Lab 05/28/19 1545 05/28/19 1925 05/28/19 2314 05/29/19 0320 05/29/19 0759  GLUCAP 256* 242* 228* 224* 167*   Lipid Profile: No results for input(s): CHOL, HDL, LDLCALC, TRIG, CHOLHDL, LDLDIRECT in the last 72 hours. Thyroid Function Tests: No results for input(s): TSH, T4TOTAL, FREET4, T3FREE, THYROIDAB in the last 72 hours. Anemia Panel: No results for input(s): VITAMINB12, FOLATE, FERRITIN, TIBC, IRON, RETICCTPCT in the last 72 hours. Urine analysis:    Component Value Date/Time   COLORURINE YELLOW 05/19/2019 1355   APPEARANCEUR CLEAR 05/19/2019 1355   APPEARANCEUR Clear 07/23/2017   LABSPEC 1.013 05/19/2019 1355   PHURINE 5.0 05/19/2019 1355   GLUCOSEU NEGATIVE 05/19/2019 1355   GLUCOSEU 500 (A) 07/14/2017 1053   HGBUR SMALL (A) 05/19/2019 1355   BILIRUBINUR NEGATIVE 05/19/2019 1355   BILIRUBINUR Negative 07/23/2017   KETONESUR NEGATIVE 05/19/2019 1355   PROTEINUR NEGATIVE 05/19/2019 1355   UROBILINOGEN 0.2 07/14/2017 1053   NITRITE NEGATIVE 05/19/2019 1355   LEUKOCYTESUR NEGATIVE 05/19/2019 1355   Sepsis Labs: @LABRCNTIP (procalcitonin:4,lacticidven:4)  ) Recent Results (from the past 240 hour(s))  Surgical pcr screen     Status: None   Collection Time: 05/24/19  7:39  PM   Specimen: Nasal Mucosa; Nasal Swab  Result Value Ref Range Status   MRSA, PCR NEGATIVE NEGATIVE Final   Staphylococcus aureus NEGATIVE NEGATIVE Final    Comment: (NOTE) The Xpert SA Assay (  FDA approved for NASAL specimens in patients 68 years of age and older), is one component of a comprehensive surveillance program. It is not intended to diagnose infection nor to guide or monitor treatment. Performed at [facility], [address] [place], [place] [address]       Studies: No results found.  Scheduled Meds:  ascorbic acid  500 mg Per Tube Daily   carvedilol  3.125 mg Per Tube BID   chlorhexidine  15 mL Mouth Rinse BID   Chlorhexidine Gluconate Cloth  6 each Topical Daily   docusate  100 mg Per Tube Daily   fluticasone  2 spray Each Nare Daily   fluticasone furoate-vilanterol  1 puff Inhalation Daily   insulin aspart  0-20 Units Subcutaneous Q4H   insulin detemir  40 Units Subcutaneous BID   loratadine  10 mg Per Tube QPM   losartan  12.5 mg Per Tube Daily   mouth rinse  15 mL Mouth Rinse q12n4p   multivitamin with minerals  1 tablet Per Tube Daily   pantoprazole sodium  40 mg Per Tube Daily   polyethylene glycol  17 g Per Tube Daily   senna-docusate  2 tablet Per Tube BID   simvastatin  20 mg Oral q morning - 10a   sodium chloride flush  10-40 mL Intracatheter Q12H   sodium chloride flush  3 mL Intravenous Q12H   torsemide  20 mg Oral q1800   umeclidinium bromide  1 puff Inhalation Daily   Vilazodone HCl  40 mg Oral Daily    Continuous Infusions:  sodium chloride Stopped (05/25/19 1944)   feeding supplement (PIVOT 1.5 CAL) 1,000 mL (05/29/19 0707)   heparin 2,450 Units/hr (05/29/19 0700)     LOS: 23 days     Darlin Droparole N Raeghan Demeter, MD [facility] Pager [phone]  If 7PM-7AM, please contact night-coverage [email or web address] Password [email or web address] 05/29/2019, 9:53 AM

## 2019-05-29 NOTE — Progress Notes (Signed)
Subjective: Patient reports pain back/surgical site, about the same as yesterday and no acute events overnight; resting/sleeping this a.m.No CP/palpitations/SOB/N/V  Objective: Vital signs in last 24 hours: Temp:  [97.9 F (36.6 C)-98.6 F (37 C)] 98 F (36.7 C) (11/21 0800) Pulse Rate:  [48-94] 53 (11/21 0700) Resp:  [13-22] 19 (11/21 0700) BP: (88-113)/(43-69) 88/54 (11/21 0700) SpO2:  [86 %-100 %] 100 % (11/21 0757) Weight:  [113.6 kg] 113.6 kg (11/21 0500)  Intake/Output from previous day: 11/20 0701 - 11/21 0700 In: 2026 [I.V.:586; NG/GT:1440] Out: 1225 [Urine:1150; Drains:75] Intake/Output this shift: No intake/output data recorded.  Neurologic: Grossly normal Resp: normal excursion, occasional coarse upper airway sounds Extremities: edema 1+ hands/feet  Lab Results: Recent Labs    05/28/19 0511 05/29/19 0558  WBC 5.6 4.4  HGB 7.6* 7.4*  HCT 25.2* 25.1*  PLT 128* 146*   BMET Recent Labs    05/28/19 0511 05/29/19 0558  NA 135 140  K 4.3 4.3  CL 98 101  CO2 28 31  GLUCOSE 244* 215*  BUN 39* 38*  CREATININE 0.70 0.57*  CALCIUM 7.8* 8.0*    Studies/Results: Am Dg Chest Port 1 View  Result Date: 05/28/2019 CLINICAL DATA:  Respiratory failure EXAM: PORTABLE CHEST 1 VIEW COMPARISON:  05/27/2019 FINDINGS: Mild bibasilar atelectasis. Pulmonary vascular congestion without frank interstitial edema. No pleural effusion or pneumothorax. Right arm PICC terminates the cavoatrial junction. Enteric tube courses below the stomach. Cardiomegaly.  Median sternotomy. Skin staples overlying the midline lower chest. Old right rib fracture deformities. Lumbar spine fixation hardware, incompletely visualized. IMPRESSION: Pulmonary vascular congestion with mild bibasilar atelectasis. Support apparatus and postsurgical changes, as above. Electronically Signed   By: Julian Hy M.D.   On: 05/28/2019 08:42    Assessment/Plan: POD #5 s/p decompression L1-2 chance fx,  stabilization T11-L4, to ICU post op with ventilation issues. Extubated w/o difficulty still on Kelliher.   LOS: 23 days  continue pulm toilet, anticoag for dysrhythmia; needs PT for conditioning, transition to home. To floor when bed available. Signing off for now but remain available   Bonna Gains 05/29/2019, 9:10 AM

## 2019-05-29 NOTE — Progress Notes (Signed)
Patient ID: Cody Hale, male   DOB: May 30, 1951, 68 y.o.   MRN: [id number] BP (!) 101/42   Pulse (!) 105   Temp 100.1 F (37.8 C) (Oral)   Resp (!) 21   Ht 5\' 9"  (1.753 m)   Wt 113.6 kg   SpO2 96%   BMI 36.98 kg/m  Alert, oriented x 4, following all commands Moving all extremities Febrile, will order blood cx, and cbc

## 2019-05-29 NOTE — Progress Notes (Signed)
ANTICOAGULATION CONSULT NOTE  Pharmacy Consult for Heparin Indication: atrial fibrillation   Patient Measurements: Height: 5\' 9"  (175.3 cm) Weight: 250 lb 7.1 oz (113.6 kg) IBW/kg (Calculated) : 70.7 Heparin Dosing Weight: 98 kg  Vital Signs: Temp: 98.6 F (37 C) (11/21 0400) Temp Source: Axillary (11/21 0400) BP: 88/54 (11/21 0700) Pulse Rate: 53 (11/21 0700)  Labs: Recent Labs    05/27/19 0911  05/28/19 0511 05/28/19 1230 05/29/19 0558  HGB 8.1*  --  7.6*  --  7.4*  HCT 27.1*  --  25.2*  --  25.1*  PLT 131*  --  128*  --  146*  HEPARINUNFRC  --    < > 0.47 0.40 0.50  CREATININE 0.77  --  0.70  --  0.57*   < > = values in this interval not displayed.     Assessment: 68 year old male on Xarelto prior to admission for atrial fibrillation who presented post fall and was transitioned to Heparin IV due to need for possible lumbar surgery on 11/17.   Heparin level remains therapeutic, H/H low but stable over last 48h and no bleeding reported, plts up 146.    Goal of Therapy:  Heparin level 0.3-0.7 units/ml Monitor platelets by anticoagulation protocol: Yes  Plan:  Continue heparin at 2450 units/hr Daily heparin level, CBC, s/s bleeding  Bertis Ruddy, PharmD Clinical Pharmacist Please check AMION for all [place] numbers 05/29/2019 8:12 AM

## 2019-05-30 DIAGNOSIS — R7881 Bacteremia: Secondary | ICD-10-CM | POA: Diagnosis not present

## 2019-05-30 LAB — BASIC METABOLIC PANEL
Anion gap: 6 (ref 5–15)
BUN: 42 mg/dL — ABNORMAL HIGH (ref 8–23)
CO2: 34 mmol/L — ABNORMAL HIGH (ref 22–32)
Calcium: 8.1 mg/dL — ABNORMAL LOW (ref 8.9–10.3)
Chloride: 102 mmol/L (ref 98–111)
Creatinine, Ser: 0.61 mg/dL (ref 0.61–1.24)
GFR calc Af Amer: >60 mL/min (ref >60)
GFR calc non Af Amer: >60 mL/min (ref >60)
Glucose, Bld: 208 mg/dL — ABNORMAL HIGH (ref 70–99)
Potassium: 4.5 mmol/L (ref 3.5–5.1)
Sodium: 142 mmol/L (ref 135–145)

## 2019-05-30 LAB — CBC
HCT: 27 % — ABNORMAL LOW (ref 39.0–52.0)
Hemoglobin: 7.9 g/dL — ABNORMAL LOW (ref 13.0–17.0)
MCH: 27.9 pg (ref 26.0–34.0)
MCHC: 29.3 g/dL — ABNORMAL LOW (ref 30.0–36.0)
MCV: 95.4 fL (ref 80.0–100.0)
Platelets: 173 10*3/uL (ref 150–400)
RBC: 2.83 MIL/uL — ABNORMAL LOW (ref 4.22–5.81)
RDW: 18 % — ABNORMAL HIGH (ref 11.5–15.5)
WBC: 4.4 10*3/uL (ref 4.0–10.5)
nRBC: 0 % (ref 0.0–0.2)

## 2019-05-30 LAB — GLUCOSE, CAPILLARY
Glucose-Capillary: 154 mg/dL — ABNORMAL HIGH (ref 70–99)
Glucose-Capillary: 168 mg/dL — ABNORMAL HIGH (ref 70–99)
Glucose-Capillary: 187 mg/dL — ABNORMAL HIGH (ref 70–99)
Glucose-Capillary: 190 mg/dL — ABNORMAL HIGH (ref 70–99)
Glucose-Capillary: 192 mg/dL — ABNORMAL HIGH (ref 70–99)
Glucose-Capillary: 209 mg/dL — ABNORMAL HIGH (ref 70–99)

## 2019-05-30 LAB — HEPARIN LEVEL (UNFRACTIONATED): Heparin Unfractionated: 0.64 IU/mL (ref 0.30–0.70)

## 2019-05-30 MED ORDER — LOSARTAN POTASSIUM 25 MG PO TABS: 12.5000 mg | ORAL_TABLET | Freq: Every day | ORAL | Status: DC

## 2019-05-30 MED ORDER — INSULIN ASPART 100 UNIT/ML ~~LOC~~ SOLN
4.0000 [IU] | Freq: Three times a day (TID) | SUBCUTANEOUS | Status: DC
  Administered 2019-05-30 – 2019-06-05 (×17): 4 [IU] via SUBCUTANEOUS

## 2019-05-30 MED ORDER — BETHANECHOL CHLORIDE 10 MG PO TABS
10.0000 mg | ORAL_TABLET | Freq: Two times a day (BID) | ORAL | Status: DC
  Administered 2019-05-30 – 2019-06-06 (×15): 10 mg
  Filled 2019-05-30 (×15): qty 1

## 2019-05-30 MED ORDER — CARVEDILOL 3.125 MG PO TABS: 3.1250 mg | ORAL_TABLET | Freq: Two times a day (BID) | ORAL | Status: DC

## 2019-05-30 NOTE — NC FL2 (Signed)
[place] MEDICAID FL2 LEVEL OF CARE SCREENING TOOL     IDENTIFICATION  Patient Name: Cody Hale Birthdate: 05-19-1951 Sex: male Admission Date (Current Location): 05/06/2019  [facility] and [place] Number:  [employer] and Address:  [facility], [address], [place], [place] [address]      Provider Number: [id number]  Attending Physician Name and Address:  Darlin Drop, DO  Relative Name and Phone Number:  Rhonda Vangieson, [phone]    Current Level of Care: Hospital Recommended Level of Care: Skilled Nursing Facility Prior Approval Number:    Date Approved/Denied: 01/17/18 PASRR Number: [id number]  Discharge Plan: SNF    Current Diagnoses: Patient Active Problem List   Diagnosis Date Noted  . Hyponatremia 05/23/2019  . Bacteremia   . Pressure injury of skin 05/09/2019  . Closed unstable burst fracture of first lumbar vertebra (HCC) 05/07/2019  . COVID-19   . Unstable burst fracture of unspecified lumbar vertebra, initial encounter for closed fracture (HCC) 05/06/2019  . Closed unstable burst fracture of lumbar vertebra (HCC) 05/06/2019  . Decubitus ulcer of sacral region, stage 2 (HCC) 01/21/2019  . Traumatic ecchymosis of left knee 01/13/2019  . COPD with acute exacerbation (HCC) 01/13/2019  . Chronic respiratory failure with hypoxia (HCC)   . Major depressive disorder, recurrent severe without psychotic features (HCC) 05/19/2018  . GERD with esophagitis 04/22/2018  . Nonischemic cardiomyopathy (HCC) 03/23/2018  . Therapeutic opioid-induced constipation (OIC) 12/04/2017  . Venous stasis dermatitis of both lower extremities 07/31/2017  . Unilateral primary osteoarthritis, right hip 04/22/2017  . 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 Seasonal allergic rhinitis due to pollen 03/13/2016  . Chronic atrial fibrillation 01/11/2015  . S/P mitral valve replacement with bioprosthetic valve 01/11/2015  . Depression with  somatization 11/22/2014  . Primary osteoarthritis of both knees 11/22/2014  . COPD (chronic obstructive pulmonary disease) with chronic bronchitis (HCC) 05/12/2014  . B12 deficiency anemia 02/03/2014  . Routine general medical examination at a health care facility 03/21/2013  . Hyperlipidemia with target LDL less than 70 03/19/2013  . Chronic systolic CHF (congestive heart failure) (HCC) 02/17/2013  . Essential hypertension, malignant 12/01/2012  . Type II diabetes mellitus with manifestations (HCC)   . Gastroparesis due to DM (HCC) 11/30/2012  . OSA (obstructive sleep apnea) 11/16/2012  . Endocarditis-resolved 08/26/2012  . Class 2 severe obesity due to excess calories with serious comorbidity and body mass index (BMI) of 35.0 to 35.9 in adult [facility]) 06/27/2012    Orientation RESPIRATION BLADDER Height & Weight     Self, Time, Situation, Place  O2(Nasal cannula 3 l/min) Continent Weight: 250 lb 7.1 oz (113.6 kg) Height:  5\' 9"  (175.3 cm)  BEHAVIORAL SYMPTOMS/MOOD NEUROLOGICAL BOWEL NUTRITION STATUS      Continent Diet  AMBULATORY STATUS COMMUNICATION OF NEEDS Skin   Extensive Assist Verbally Normal                       Personal Care Assistance Level of Assistance  Dressing, Feeding, Bathing Bathing Assistance: Maximum assistance Feeding assistance: Limited assistance Dressing Assistance: Maximum assistance     Functional Limitations Info             SPECIAL CARE FACTORS FREQUENCY  PT (By licensed PT), OT (By licensed OT)     PT Frequency: 5x weekly OT Frequency: 5x weekly            Contractures  Additional Factors Info  Code Status, Allergies Code Status Info: Full Allergies Info: Lisinopril, daptomycin, metformin           Current Medications (05/30/2019):  This is the current hospital active medication list Current Facility-Administered Medications  Medication Dose Route Frequency Provider Last Rate Last Dose  . 0.9 %  sodium chloride  infusion  250 mL Intravenous Continuous Kipp Brood, MD   Stopped at 05/25/19 1944  . acetaminophen (TYLENOL) 160 MG/5ML suspension 650 mg  650 mg Per Tube Q6H PRN Kipp Brood, MD      . acetaminophen (TYLENOL) suppository 650 mg  650 mg Rectal Q6H PRN Kipp Brood, MD   650 mg at 05/10/19 1547  . ascorbic acid (VITAMIN C) 500 MG/5ML syrup 500 mg  500 mg Per Tube Daily Agarwala, Einar Grad, MD   500 mg at 05/30/19 0938  . bethanechol (URECHOLINE) tablet 10 mg  10 mg Per Tube BID Irene Pap N, DO      . carvedilol (COREG) tablet 3.125 mg  3.125 mg Per Tube BID Kipp Brood, MD   3.125 mg at 05/30/19 0937  . chlorhexidine (PERIDEX) 0.12 % solution 15 mL  15 mL Mouth Rinse BID Kipp Brood, MD   15 mL at 05/30/19 0937  . Chlorhexidine Gluconate Cloth 2 % PADS 6 each  6 each Topical Daily Kipp Brood, MD   6 each at 05/30/19 0900  . docusate (COLACE) 50 MG/5ML liquid 100 mg  100 mg Per Tube Daily Agarwala, Einar Grad, MD   100 mg at 05/30/19 0937  . feeding supplement (PIVOT 1.5 CAL) liquid 1,000 mL  1,000 mL Per Tube Continuous Kipp Brood, MD 60 mL/hr at 05/30/19 0700    . fluticasone (FLONASE) 50 MCG/ACT nasal spray 2 spray  2 spray Each Nare Daily Kipp Brood, MD   2 spray at 05/30/19 0937  . fluticasone furoate-vilanterol (BREO ELLIPTA) 100-25 MCG/INH 1 puff  1 puff Inhalation Daily Agarwala, Ravi, MD   1 puff at 05/30/19 0848  . heparin ADULT infusion 100 units/mL (25000 units/26mL sodium chloride 0.45%)  2,450 Units/hr Intravenous Continuous Agarwala, Ravi, MD 24.5 mL/hr at 05/30/19 0800 2,450 Units/hr at 05/30/19 0800  . insulin aspart (novoLOG) injection 0-20 Units  0-20 Units Subcutaneous Q4H Kipp Brood, MD   4 Units at 05/30/19 0830  . insulin aspart (novoLOG) injection 4 Units  4 Units Subcutaneous TID WC Hall, Carole N, DO      . insulin detemir (LEVEMIR) injection 40 Units  40 Units Subcutaneous BID Kipp Brood, MD   40 Units at 05/30/19 0938  . lidocaine (PF) (XYLOCAINE)  1 % injection   Infiltration PRN Greggory Keen, MD   10 mL at 05/17/19 1623  . loratadine (CLARITIN) tablet 10 mg  10 mg Per Tube QPM Kipp Brood, MD   10 mg at 05/29/19 1721  . losartan (COZAAR) tablet 12.5 mg  12.5 mg Per Tube Daily Agarwala, Einar Grad, MD   12.5 mg at 05/30/19 0937  . MEDLINE mouth rinse  15 mL Mouth Rinse q12n4p Agarwala, Ravi, MD   15 mL at 05/29/19 1650  . multivitamin with minerals tablet 1 tablet  1 tablet Per Tube Daily Kipp Brood, MD   1 tablet at 05/30/19 0937  . ondansetron (ZOFRAN) tablet 4 mg  4 mg Oral Q6H PRN Kipp Brood, MD       Or  . ondansetron (ZOFRAN) injection 4 mg  4 mg Intravenous Q6H PRN Kipp Brood, MD   4 mg at 05/20/19  1036  . oxyCODONE (Oxy IR/ROXICODONE) immediate release tablet 5 mg  5 mg Per Tube Q4H PRN Lynnell Catalan, MD   5 mg at 05/30/19 0728  . pantoprazole sodium (PROTONIX) 40 mg/20 mL oral suspension 40 mg  40 mg Per Tube Daily Lynnell Catalan, MD   40 mg at 05/30/19 0937  . polyethylene glycol (MIRALAX / GLYCOLAX) packet 17 g  17 g Per Tube Daily Agarwala, Ravi, MD   17 g at 05/29/19 1030  . senna-docusate (Senokot-S) tablet 2 tablet  2 tablet Per Tube BID Lynnell Catalan, MD   2 tablet at 05/28/19 1023  . simvastatin (ZOCOR) tablet 20 mg  20 mg Per Tube q morning - 10a Dorcas Carrow, MD   20 mg at 05/30/19 0937  . sodium chloride flush (NS) 0.9 % injection 10-40 mL  10-40 mL Intracatheter Q12H Lynnell Catalan, MD   10 mL at 05/29/19 2316  . sodium chloride flush (NS) 0.9 % injection 10-40 mL  10-40 mL Intracatheter PRN Agarwala, Daleen Bo, MD      . sodium chloride flush (NS) 0.9 % injection 3 mL  3 mL Intravenous Q12H Lynnell Catalan, MD   3 mL at 05/29/19 2315  . sodium chloride flush (NS) 0.9 % injection 3 mL  3 mL Intravenous PRN Agarwala, Daleen Bo, MD      . torsemide (DEMADEX) tablet 20 mg  20 mg Per Tube [id number] Dorcas Carrow, MD   20 mg at 05/29/19 1722  . umeclidinium bromide (INCRUSE ELLIPTA) 62.5 MCG/INH 1 puff  1 puff Inhalation  Daily Lynnell Catalan, MD   1 puff at 05/30/19 0848  . Vilazodone HCl (VIIBRYD) TABS 40 mg  40 mg Oral Daily Dorcas Carrow, MD   40 mg at 05/30/19 4982     Discharge Medications: Please see discharge summary for a list of discharge medications.  Relevant Imaging Results:  Relevant Lab Results:   Additional Information SSN: [id number] [place], [place]

## 2019-05-30 NOTE — Progress Notes (Addendum)
PROGRESS NOTE  Cody Hale Castile GNF:[id number] DOB: 12/28/1950 DOA: 05/06/2019 PCP: Etta GrandchildJones, Thomas L, MD  HPI/Recap of past 24 hours: Cody Hale Duck is a 68 y.o. 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  Patient fell onto his buttock.  Started hurting and was brought to the ER.  Denies any incontinence of urine or bowel.  Patient's pain has been significant.  ED Course: In the ER at [facility] MRI showed unstable lumbar fracture and patient was transferred to [facility] .  Patient had Covid test done which was positive.  Patient otherwise is denying any shortness of breath has been a some productive cough chest x-ray was showing cardiomegaly and nothing acute.  Other labs show BNP of 326 LDH 331 CRP 14.7 WBC 11.9 hemoglobin 13.8 platelets 104 creatinine 1.3.  Neurosurgery has been consulted patient is being admitted for further management of lumbar fracture with COVID-19 being positive.     Brief interval history:Patient with a history of atrial fibrillation on chronic anticoagulation with Xarelto, CAD, history of endocarditis, left bundle branch block, history of mitral valve replacement, chronic systolic heart failure, SVT, COPD, chronic venous insufficiency, and hypertension who presented on 10/29 after a fall.  He was found to have an unstable fracture of his lumbar spine but was also found to be positive for (asymptomatic) COVID-19 infection.   While waiting for surgical clearance (increased risk of complications in the setting of surgery with COVID infection) and developed GBS bacteremia, now off antibiotics.  He had multilevel spinal fusion on 11/17.  He was difficult to ventilate in the OR and so kept intubated overnight.  Patient was previously on the hospitalist service.   Currently on RA.  He  is quite deconditioned due to prolonged hospitalization.  He will need aggressive pulmonary toilet.  PT is seeing him.  Neurosurgery is following and he has a drain in place, but he is stable from this standpoint.  He is likely to need rehab - CIR vs. SNF, likely too early to tell which.  TRH will resume care of the patient on 11/21.  11/21: Patient was seen and examined at his bedside this morning.  Reports persistent nonproductive cough and back pain.  States breathing is improved.  Denies any chest pain.  05/30/19: No new complaints. No acute events overnight.    Assessment/Plan: Active Problems:   OSA (obstructive sleep apnea)   Type II diabetes mellitus with manifestations (HCC)   Chronic systolic CHF (congestive heart failure) (HCC)   Hyperlipidemia with target LDL less than 70   Chronic atrial fibrillation   Chronic respiratory failure with hypoxia (HCC)   Unstable burst fracture of unspecified lumbar vertebra, initial encounter for closed fracture (HCC)   Closed unstable burst fracture of lumbar vertebra (HCC)   Closed unstable burst fracture of first lumbar vertebra (HCC)   Pressure injury of skin   Bacteremia   Hyponatremia  POD #6 post T11-L4 fusion for L1 Chance fracture. Neurosurgery following Stable, no new changes as per neurosurgery Continue pain management  Acute hypoxic hypercarbic respiratory failure requiring invasive mechanical ventilation likely secondary to COVID-19 pneumonia/COPD exacerbation Not On oxygen supplementation at baseline Extubated on 05/26/2019 Currently on nasal cannula 3 L with O2 saturation in the upper 90s Continue pulmonary toilet Continue to maintain O2 saturation greater than 92%  Acute urinary retention Indwelling  Foley catheter placed on 05/29/2019 Bethanechol started on 05/30/2019, continue Continue to monitor urine output Urine output 1.8 L recorded in the last 24 hours  Chronic A. Fib Rate is controlled Continue Coreg for  rate control Continue heparin drip for CVA prevention On Xarelto at home, switch back to Xarelto once no procedures are planned  Streptococcous Group G bacteremia, poa Blood cx x 2 positive on 05/10/19. Repeat blood cx x2 done on 05/11/19 staph coag neg x1 (likely contaminant) and other bottle negative growth final. Completed course of treatment Blood cultures drawn on 05/29/2019 due to concern for fever negative times less than 24-hour We will continue to follow cultures  Treated COVID-19 viral pneumonia  Treated with 5 days of remdesivir Continue bronchodilators and Lasix via NG tube Continue to maintain O2 saturation greater than 94%  COPD exacerbation Continue pulmonary toilet Independently viewed chest x-ray which shows mild bibasilar atelectasis. Rest of management as stated above  Dysphagia, coretrack in place Failed swallow evaluation by speech therapist N.p.o. per speech therapy recommendations Continue NG tube for nutrition and medication administration  Chronic systolic CHF Last 2D echo showed LVEF 40 to 45% on 05/20/2019. Continue strict I's and O's and daily weight Continue current medication: On Coreg 3.125 mg twice daily, losartan 12.5 mg daily,Torsemide 20 mg daily  GERD Continue PPI  Hyperlipidemia Continue statin  Physical debility/ambulatory dysfunction PT assessment recommended to continue improvement toward CIR. OT assessed and recommended SNF with 24-hour supervision/assistance and 3 in 1 bedside commode. Continue PT OT with assistance and fall precautions   Code Status: Full code  Family Communication: None at bedside.  Disposition Plan: SNF versus CIR when neurosurgery signs off.  Consultants:  Neurosurgery  PCCM  Procedures: 11/17: PROCEDURE: 2. Open reduction, internal fixation L1-2 Chance fracture 3. Posterior segmental instrumented pedicle screw stabilization, T11-L4 4. Posterolateral arthrodesis, T11-L4 Use of non-structural  morcellized bone allograft - DBX 05/25/2019 intubation in OR per anesthesia>>extubated 11/18 Antimicrobials: Penicillin G 11/3-11/17  Significant Hospital Events   10/29 admitted for back pain  10/29 positive Covid test-asymptomatic GBS bacteremia, TEE negative for endocarditis.  Completed antibiotics on 1116 Mild exacerbation of COPD requiring diuresis 11/12 Multilevel spinal stabilization 11/17.   DVT prophylaxis: Heparin drip   Objective: Vitals:   05/30/19 0849 05/30/19 0900 05/30/19 1000 05/30/19 1100  BP:  (!) 119/57 (!) 144/94 129/61  Pulse:  86 81 78  Resp:  18 20 16   Temp:      TempSrc:      SpO2: 99% 100% 100% 100%  Weight:      Height:        Intake/Output Summary (Last 24 hours) at 05/30/2019 1210 Last data filed at 05/30/2019 1100 Gross per 24 hour  Intake 1816.83 ml  Output 1875 ml  Net -58.17 ml   Filed Weights   05/28/19 0500 05/29/19 0500 05/30/19 0500  Weight: 111.3 kg 113.6 kg 113.6 kg    Exam:   General: 68 y.o. year-old male well-developed well-nourished in no acute distress.  Somnolent easily arousable to voices.  Cardiovascular: Irregular rate and rhythm no rubs or gallops no JVD or thyromegaly noted.  Respiratory: Mild rales at bases no wheezing noted.  Poor inspiratory effort.    Abdomen: Obese nontender bowel sounds present.  Musculoskeletal: Trace edema in lower extremities bilaterally. Psychiatry: Unable to assess mood due to somnolence.  Data Reviewed: CBC: Recent Labs  Lab 05/27/19 0911 05/28/19 0511 05/29/19 0558 05/29/19 1511 05/30/19 0631  WBC 7.0 5.6 4.4 6.7 4.4  NEUTROABS 5.8  --   --  5.6  --   HGB 8.1* 7.6* 7.4* 7.9* 7.9*  HCT 27.1* 25.2* 25.1* 26.7* 27.0*  MCV 92.2 92.6 93.7 93.7 95.4  PLT 131* 128* 146* 167 295   Basic Metabolic Panel: Recent Labs  Lab 05/26/19 0459 05/27/19 0902 05/27/19 0911 05/28/19 0511 05/29/19 0558 05/30/19 0631  NA 130* 132* 131* 135 140 142  K 4.7 4.3 4.3 4.3 4.3 4.5  CL 96*   --  97* 98 101 102  CO2 24  --  28 28 31  34*  GLUCOSE 140*  --  315* 244* 215* 208*  BUN 20  --  34* 39* 38* 42*  CREATININE 0.60*  --  0.77 0.70 0.57* 0.61  CALCIUM 7.8*  --  7.7* 7.8* 8.0* 8.1*  MG  --   --  2.3 2.5*  --   --   PHOS  --   --  3.4 2.8  --   --    GFR: Estimated Creatinine Clearance: 109.9 mL/min (by C-G formula based on SCr of 0.61 mg/dL). Liver Function Tests: No results for input(s): AST, ALT, ALKPHOS, BILITOT, PROT, ALBUMIN in the last 168 hours. No results for input(s): LIPASE, AMYLASE in the last 168 hours. No results for input(s): AMMONIA in the last 168 hours. Coagulation Profile: No results for input(s): INR, PROTIME in the last 168 hours. Cardiac Enzymes: Recent Labs  Lab 05/25/19 1323  CKTOTAL 136  CKMB 1.3   BNP (last 3 results) No results for input(s): PROBNP in the last 8760 hours. HbA1C: No results for input(s): HGBA1C in the last 72 hours. CBG: Recent Labs  Lab 05/29/19 1936 05/29/19 2320 05/30/19 0332 05/30/19 0745 05/30/19 1158  GLUCAP 201* 195* 187* 192* 190*   Lipid Profile: No results for input(s): CHOL, HDL, LDLCALC, TRIG, CHOLHDL, LDLDIRECT in the last 72 hours. Thyroid Function Tests: No results for input(s): TSH, T4TOTAL, FREET4, T3FREE, THYROIDAB in the last 72 hours. Anemia Panel: No results for input(s): VITAMINB12, FOLATE, FERRITIN, TIBC, IRON, RETICCTPCT in the last 72 hours. Urine analysis:    Component Value Date/Time   COLORURINE YELLOW 05/19/2019 [place] 05/19/2019 1355   APPEARANCEUR Clear 07/23/2017   LABSPEC 1.013 05/19/2019 1355   PHURINE 5.0 05/19/2019 1355   GLUCOSEU NEGATIVE 05/19/2019 1355   GLUCOSEU 500 (A) 07/14/2017 1053   HGBUR SMALL (A) 05/19/2019 1355   BILIRUBINUR NEGATIVE 05/19/2019 1355   BILIRUBINUR Negative 07/23/2017   KETONESUR NEGATIVE 05/19/2019 1355   PROTEINUR NEGATIVE 05/19/2019 1355   UROBILINOGEN 0.2 07/14/2017 1053   NITRITE NEGATIVE 05/19/2019 1355    LEUKOCYTESUR NEGATIVE 05/19/2019 1355   Sepsis Labs: @LABRCNTIP (procalcitonin:4,lacticidven:4)  ) Recent Results (from the past 240 hour(s))  Surgical pcr screen     Status: None   Collection Time: 05/24/19  7:39 PM   Specimen: Nasal Mucosa; Nasal Swab  Result Value Ref Range Status   MRSA, PCR NEGATIVE NEGATIVE Final   Staphylococcus aureus NEGATIVE NEGATIVE Final    Comment: (NOTE) The Xpert SA Assay (FDA approved for NASAL specimens in patients 52 years of age and older), is one component of a comprehensive surveillance program. It is not intended to diagnose infection nor to guide or monitor treatment. Performed at [facility], [place] [address], [place], [place] [address]   Culture, blood (routine x 2)     Status: None (Preliminary result)   Collection Time: 05/29/19  4:14 PM   Specimen: BLOOD LEFT HAND  Result  Value Ref Range Status   Specimen Description BLOOD LEFT HAND  Final   Special Requests AEROBIC BOTTLE ONLY Blood Culture adequate volume  Final   Culture   Final    NO GROWTH < 24 HOURS Performed at [facility], [address] [place], [place] [address]    Report Status PENDING  Incomplete  Culture, blood (routine x 2)     Status: None (Preliminary result)   Collection Time: 05/29/19  4:14 PM   Specimen: BLOOD LEFT HAND  Result Value Ref Range Status   Specimen Description BLOOD LEFT HAND  Final   Special Requests AEROBIC BOTTLE ONLY Blood Culture adequate volume  Final   Culture   Final    NO GROWTH < 24 HOURS Performed at [facility], [address], [place], [place] [address]    Report Status PENDING  Incomplete      Studies: No results found.  Scheduled Meds:  ascorbic acid  500 mg Per Tube Daily   bethanechol  10 mg Per Tube BID   carvedilol  3.125 mg Per Tube BID   chlorhexidine  15 mL Mouth Rinse BID   Chlorhexidine Gluconate Cloth  6 each Topical Daily   docusate  100 mg Per Tube Daily   fluticasone  2 spray Each  Nare Daily   fluticasone furoate-vilanterol  1 puff Inhalation Daily   insulin aspart  0-20 Units Subcutaneous Q4H   insulin aspart  4 Units Subcutaneous TID WC   insulin detemir  40 Units Subcutaneous BID   loratadine  10 mg Per Tube QPM   losartan  12.5 mg Per Tube Daily   mouth rinse  15 mL Mouth Rinse q12n4p   multivitamin with minerals  1 tablet Per Tube Daily   pantoprazole sodium  40 mg Per Tube Daily   polyethylene glycol  17 g Per Tube Daily   senna-docusate  2 tablet Per Tube BID   simvastatin  20 mg Per Tube q morning - 10a   sodium chloride flush  10-40 mL Intracatheter Q12H   sodium chloride flush  3 mL Intravenous Q12H   torsemide  20 mg Per Tube q1800   umeclidinium bromide  1 puff Inhalation Daily   Vilazodone HCl  40 mg Oral Daily    Continuous Infusions:  sodium chloride Stopped (05/25/19 1944)   feeding supplement (PIVOT 1.5 CAL) 60 mL/hr at 05/30/19 0700   heparin 2,450 Units/hr (05/30/19 1100)     LOS: 24 days     Darlin Drop, MD [facility] Pager [phone]  If 7PM-7AM, please contact night-coverage [email or web address] Password [facility] 05/30/2019, 12:10 PM

## 2019-05-30 NOTE — Progress Notes (Signed)
Patient ID: Cody Hale, male   DOB: 06/30/1951, 68 y.o.   MRN: [id number] BP (!) 144/60 (BP Location: Left Leg)   Pulse (!) 111   Temp 98 F (36.7 C) (Axillary)   Resp 15   Ht 5\' 9"  (1.753 m)   Wt 113.6 kg   SpO2 99%   BMI 36.98 kg/m  Alert and oriented Moving all extremities Wound is clean dry and without signs of infection No new recommendations

## 2019-05-30 NOTE — Progress Notes (Signed)
ANTICOAGULATION CONSULT NOTE  Pharmacy Consult for Heparin Indication: atrial fibrillation   Patient Measurements: Height: 5\' 9"  (175.3 cm) Weight: 250 lb 7.1 oz (113.6 kg) IBW/kg (Calculated) : 70.7 Heparin Dosing Weight: 98 kg  Vital Signs: Temp: 97.4 F (36.3 C) (11/22 0400) Temp Source: Oral (11/22 0400) BP: 141/53 (11/22 0700) Pulse Rate: 76 (11/22 0700)  Labs: Recent Labs    05/28/19 0511 05/28/19 1230 05/29/19 0558 05/29/19 1511 05/30/19 0438 05/30/19 0631  HGB 7.6*  --  7.4* 7.9*  --  7.9*  HCT 25.2*  --  25.1* 26.7*  --  27.0*  PLT 128*  --  146* 167  --  173  HEPARINUNFRC 0.47 0.40 0.50  --  0.64  --   CREATININE 0.70  --  0.57*  --   --  0.61     Assessment: 68 year old male on Xarelto prior to admission for atrial fibrillation who presented post fall and was transitioned to Heparin IV due to need for possible lumbar surgery on 11/17.   Heparin level therapeutic, CBC stable and no bleeding reported  Goal of Therapy:  Heparin level 0.3-0.7 units/ml Monitor platelets by anticoagulation protocol: Yes  Plan:  Continue heparin at 2450 units/hr Daily heparin level, CBC, s/s bleeding  Bertis Ruddy, PharmD Clinical Pharmacist Please check AMION for all [place] numbers 05/30/2019 7:39 AM

## 2019-05-30 NOTE — Progress Notes (Signed)
  Speech Language Pathology Treatment: Dysphagia  Patient Details Name: Cody Hale MRN: [id number] DOB: 11/23/1950 Today's Date: 05/30/2019 Time: 1015-1050 SLP Time Calculation (min) (ACUTE ONLY): 35 min  Assessment / Plan / Recommendation Clinical Impression  Pt seen at bedside for skilled ST intervention targeting goals for PO readiness and pharyngeal strengthening exercises. Pt is motivated to improve swallow function. Oral care was completed with suction. Following oral care, pt was provided individual ice chips with instruction to swallow HARD. Congested cough noted, usually nonproductive. Tongue base retraction exercises were completed with mild effort noted, likely due to significant weakness. Pt completed 10 repetitions with rest breaks. Will continue skilled ST intervention with focus on strengthening pharyngeal musculature for return to po intake. FEES results reviewed with RN.   HPI HPI: Cody Hale is a 68 y.o. year old male with medical history significant for combined systolic/diastolic CHF, prosthetic mitral valve replacement with history of endocarditis in 2014, atrial fibrillation, COPD, diabetes who presented on 05/06/2019 with buttocks and back pain after a mechanical fall at home and was found to have unstable lumbar fracture and was transferred from any pain hospital to [facility]. Hospital course complicated by [id number] infection positive with no symptoms though he did complete 5 days of remdesivir and full course of heparin drip GBS bacteremia IV penicillin. Pt noted to grimace with pills.       SLP Plan  Continue with current plan of care       Recommendations  Diet recommendations: NPO Medication Administration: Via alternative means                Oral Care Recommendations: Oral care QID;Staff/trained caregiver to provide oral care Follow up Recommendations: Skilled Nursing facility SLP Visit Diagnosis: Dysphagia, pharyngeal phase (R13.13) Plan: Continue  with current plan of care       [place], [facility], [place] Speech Language Pathologist Office: [phone] Pager: [phone]  Shonna Chock 05/30/2019, 10:51 AM

## 2019-05-30 NOTE — TOC Initial Note (Signed)
Transition of Care [facility]) - Initial/Assessment Note    Patient Details  Name: Cody Hale MRN: [id number] Date of Birth: [birth year]  [facility] CM/SW Contact:    Oretha Milch, LCSW Phone Number: 05/30/2019, 9:57 AM  Clinical Narrative: CSW received consult for skilled nursing facility placement. CSW met with patient and assessed for skilled nursing facility placement  concerns. CSW notes patient reports that patient has recently been recommended possible CIR. Patient was open to discussion of skilled nursing facility placement. CSW discussed community resources and options for support. CSW noted patient reported if he does need skilled nursing placement he would prefer to be in the [place] area has he has heard positive things. CSW noted patient lives in [place].           Expected Discharge Plan: Skilled Nursing Facility Barriers to Discharge: No SNF bed   Patient Goals and CMS Choice Patient states their goals for this hospitalization and ongoing recovery are:: "To be able to go back home." CMS Medicare.gov Compare Post Acute Care list provided to:: Patient Choice offered to / list presented to : Patient  Expected Discharge Plan and Services Expected Discharge Plan: [place] arrangements for the past 2 months: Single Family Home                                      Prior Living Arrangements/Services Living arrangements for the past 2 months: Single Family Home Lives with:: Self Patient language and need for interpreter reviewed:: Yes Do you feel safe going back to the place where you live?: Yes      Need for Family Participation in Patient Care: No (Comment) Care giver support system in place?: No (comment)   Criminal Activity/Legal Involvement Pertinent to Current Situation/Hospitalization: No - Comment as needed  Activities of Daily Living Home Assistive Devices/Equipment: Cane (specify quad or straight) ADL  Screening (condition at time of admission) Patient's cognitive ability adequate to safely complete daily activities?: Yes Is the patient deaf or have difficulty hearing?: No Does the patient have difficulty seeing, even when wearing glasses/contacts?: No Does the patient have difficulty concentrating, remembering, or making decisions?: No Patient able to express need for assistance with ADLs?: Yes Does the patient have difficulty dressing or bathing?: Yes Independently performs ADLs?: No Communication: Independent Dressing (OT): Dependent Is this a change from baseline?: Change from baseline, expected to last >3 days Grooming: Needs assistance Is this a change from baseline?: Change from baseline, expected to last >3 days Feeding: Independent Bathing: Dependent Is this a change from baseline?: Change from baseline, expected to last >3 days Toileting: Needs assistance Is this a change from baseline?: Change from baseline, expected to last >3days In/Out Bed: Needs assistance Is this a change from baseline?: Change from baseline, expected to last >3 days Does the patient have difficulty walking or climbing stairs?: Yes Weakness of Legs: Both Weakness of Arms/Hands: None  Permission Sought/Granted Permission sought to share information with : Facility Art therapist granted to share information with : Yes, Verbal Permission Granted  Share Information with NAME: Facility contacts for SNF referral           Emotional Assessment Appearance:: Appears older than stated age   Affect (typically observed): Calm Orientation: : Oriented to Self, Oriented to Place, Oriented to  Time, Oriented to Situation Alcohol / Substance Use: Not  Applicable Psych Involvement: No (comment)  Admission diagnosis:  Fall, initial encounter [W19.XXXA] Closed three column fracture of lumbar vertebra, initial encounter ([place]) [S32.009A] Compression fracture of lumbar vertebra, initial  encounter, unspecified lumbar vertebral level ([place]) [S32.000A] COVID-19 [U07.1] Closed unstable burst fracture of first lumbar vertebra ([place]) [S32.012A] Patient Active Problem List   Diagnosis Date Noted  . Hyponatremia 05/23/2019  . Bacteremia   . Pressure injury of skin 05/09/2019  . Closed unstable burst fracture of first lumbar vertebra ([place]) 05/07/2019  . COVID-19   . Unstable burst fracture of unspecified lumbar vertebra, initial encounter for closed fracture ([place]) 05/06/2019  . Closed unstable burst fracture of lumbar vertebra ([place]) 05/06/2019  . Decubitus ulcer of sacral region, stage 2 ([place]) 01/21/2019  . Traumatic ecchymosis of left knee 01/13/2019  . COPD with acute exacerbation ([place]) 01/13/2019  . Chronic respiratory failure with hypoxia ([place])   . Major depressive disorder, recurrent severe without psychotic features ([place]) 05/19/2018  . GERD with esophagitis 04/22/2018  . Nonischemic cardiomyopathy ([place]) 03/23/2018  . Therapeutic opioid-induced constipation ([facility]) 12/04/2017  . Venous stasis dermatitis of both lower extremities 07/31/2017  . Unilateral primary osteoarthritis, right hip 04/22/2017  . 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 Seasonal allergic rhinitis due to pollen 03/13/2016  . Chronic atrial fibrillation 01/11/2015  . S/P mitral valve replacement with bioprosthetic valve 01/11/2015  . Depression with somatization 11/22/2014  . Primary osteoarthritis of both knees 11/22/2014  . COPD (chronic obstructive pulmonary disease) with chronic bronchitis ([place]) 05/12/2014  . B12 deficiency anemia 02/03/2014  . Routine general medical examination at a health care facility 03/21/2013  . Hyperlipidemia with target LDL less than 70 03/19/2013  . Chronic systolic CHF (congestive heart failure) ([place]) 02/17/2013  . Essential hypertension, malignant 12/01/2012  . Type II diabetes mellitus with manifestations ([place])   . Gastroparesis due to DM ([place]) 11/30/2012  .  OSA (obstructive sleep apnea) 11/16/2012  . Endocarditis-resolved 08/26/2012  . Class 2 severe obesity due to excess calories with serious comorbidity and body mass index (BMI) of 35.0 to 35.9 in adult [facility]) 06/27/2012   PCP:  [name], MD Pharmacy:   [facility] [address], [address] [place] [address] Phone: [phone] Fax: [phone]     Social Determinants of Health (SDOH) Interventions    Readmission Risk Interventions No flowsheet data found.

## 2019-05-31 DIAGNOSIS — R7881 Bacteremia: Secondary | ICD-10-CM | POA: Diagnosis not present

## 2019-05-31 LAB — BASIC METABOLIC PANEL
Anion gap: 5 (ref 5–15)
BUN: 43 mg/dL — ABNORMAL HIGH (ref 8–23)
CO2: 35 mmol/L — ABNORMAL HIGH (ref 22–32)
Calcium: 8.3 mg/dL — ABNORMAL LOW (ref 8.9–10.3)
Chloride: 104 mmol/L (ref 98–111)
Creatinine, Ser: 0.63 mg/dL (ref 0.61–1.24)
GFR calc Af Amer: >60 mL/min (ref >60)
GFR calc non Af Amer: >60 mL/min (ref >60)
Glucose, Bld: 193 mg/dL — ABNORMAL HIGH (ref 70–99)
Potassium: 4.8 mmol/L (ref 3.5–5.1)
Sodium: 144 mmol/L (ref 135–145)

## 2019-05-31 LAB — CBC
HCT: 27.1 % — ABNORMAL LOW (ref 39.0–52.0)
Hemoglobin: 7.8 g/dL — ABNORMAL LOW (ref 13.0–17.0)
MCH: 27.7 pg (ref 26.0–34.0)
MCHC: 28.8 g/dL — ABNORMAL LOW (ref 30.0–36.0)
MCV: 96.1 fL (ref 80.0–100.0)
Platelets: 197 10*3/uL (ref 150–400)
RBC: 2.82 MIL/uL — ABNORMAL LOW (ref 4.22–5.81)
RDW: 18.1 % — ABNORMAL HIGH (ref 11.5–15.5)
WBC: 3.8 10*3/uL — ABNORMAL LOW (ref 4.0–10.5)
nRBC: 0 % (ref 0.0–0.2)

## 2019-05-31 LAB — GLUCOSE, CAPILLARY
Glucose-Capillary: 165 mg/dL — ABNORMAL HIGH (ref 70–99)
Glucose-Capillary: 166 mg/dL — ABNORMAL HIGH (ref 70–99)
Glucose-Capillary: 155 mg/dL — ABNORMAL HIGH (ref 70–99)
Glucose-Capillary: 139 mg/dL — ABNORMAL HIGH (ref 70–99)
Glucose-Capillary: 115 mg/dL — ABNORMAL HIGH (ref 70–99)

## 2019-05-31 LAB — HEPARIN LEVEL (UNFRACTIONATED): Heparin Unfractionated: 0.58 IU/mL (ref 0.30–0.70)

## 2019-05-31 MED ORDER — ALBUTEROL SULFATE (2.5 MG/3ML) 0.083% IN NEBU
2.5000 mg | INHALATION_SOLUTION | Freq: Four times a day (QID) | RESPIRATORY_TRACT | Status: DC | PRN
  Administered 2019-05-31 – 2019-06-01 (×3): 2.5 mg via RESPIRATORY_TRACT
  Filled 2019-05-31 (×3): qty 3

## 2019-05-31 MED ORDER — CARVEDILOL 3.125 MG PO TABS
3.1250 mg | ORAL_TABLET | Freq: Two times a day (BID) | ORAL | Status: DC
  Administered 2019-05-31 – 2019-06-01 (×3): 3.125 mg
  Filled 2019-05-31 (×3): qty 1

## 2019-05-31 MED ORDER — SODIUM CHLORIDE 3 % IN NEBU: 4.0000 mL | INHALATION_SOLUTION | Freq: Two times a day (BID) | RESPIRATORY_TRACT | Status: DC

## 2019-05-31 MED ORDER — LOSARTAN POTASSIUM 25 MG PO TABS
12.5000 mg | ORAL_TABLET | Freq: Every day | ORAL | Status: DC
  Administered 2019-05-31 – 2019-06-03 (×4): 12.5 mg
  Filled 2019-05-31 (×4): qty 0.5

## 2019-05-31 MED ORDER — SODIUM CHLORIDE 3 % IN NEBU
4.0000 mL | INHALATION_SOLUTION | Freq: Two times a day (BID) | RESPIRATORY_TRACT | Status: DC
  Administered 2019-05-31 – 2019-06-04 (×9): 4 mL via RESPIRATORY_TRACT
  Filled 2019-05-31 (×10): qty 4

## 2019-05-31 NOTE — Progress Notes (Signed)
Nutrition Follow-up  DOCUMENTATION CODES:   Obesity unspecified  INTERVENTION:   Continue Pivot 1.5 @ 60 ml/hr via Cortrak tube MVI daily  Provides: 2160 kcal, 135 grams protein, and 1094 ml free water.    NUTRITION DIAGNOSIS:   Increased nutrient needs related to wound healing, acute illness as evidenced by estimated needs.  Ongoing.   GOAL:   Patient will meet greater than or equal to 90% of their needs  Met.   MONITOR:   PO intake, Supplement acceptance, Labs, Weight trends, I & O's, Skin  REASON FOR ASSESSMENT:   Consult Enteral/tube feeding initiation and management  ASSESSMENT:   68 y.o. 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  Patient fell onto his buttock.Patient is being admitted for further management of lumbar fracture.  Pt discussed during ICU rounds and with RN.  JP drain discontinued over the weekend. Pt continues to fail swallow evaluations, SLP working with pt on swallowing exercises. Noted pt very weak.   10/29 admit, Covid+ 11/13 Cortrak tube placed 11/17 ORIF of L1-L2; multilevel spine stabilization from T11-L4  Medications reviewed and include: vitamin c, 4 units novolog TID with meals, 40 units levemir BID, MVI, miralax, senokot-s Labs reviewed: CBG's: [phone]    Diet Order:   Diet Order            Diet NPO time specified  Diet effective now              EDUCATION NEEDS:   No education needs have been identified at this time  Skin:  Skin Assessment: Skin Integrity Issues: Skin Integrity Issues:: Stage II Stage II: right & left buttocks  Last BM:  11/22  Height:   Ht Readings from Last 1 Encounters:  05/06/19 5' 9"  (1.753 m)    Weight:   Wt Readings from Last 1 Encounters:  05/31/19 115.8 kg    Ideal Body Weight:  72.7  kg  BMI:  Body mass index is 37.7 kg/m.  Estimated Nutritional Needs:   Kcal:  2100-2300  Protein:  116-130  Fluid:  >/= 2.1 L.day  [place], LDN, CNSC [phone] Pager [phone] After Hours Pager

## 2019-05-31 NOTE — Progress Notes (Addendum)
PROGRESS NOTE  Cody SitesCarlis Hale WJX:[id number] DOB: March 21, 1951 DOA: 05/06/2019 PCP: Etta GrandchildJones, Thomas L, MD  HPI/Recap of past 24 hours: Cody SitesCarlis Posten is a 68 y.o. 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  Patient fell onto his buttock.  Started hurting and was brought to the ER.  Denies any incontinence of urine or bowel.  Patient's pain has been significant.  ED Course: In the ER at [facility] MRI showed unstable lumbar fracture and patient [facility].  Patient had Covid test done which was positive.  Patient otherwise is denying any shortness of breath has been a some productive cough chest x-ray was showing cardiomegaly and nothing acute.  Other labs show BNP of 326 LDH 331 CRP 14.7 WBC 11.9 hemoglobin 13.8 platelets 104 creatinine 1.3.  Neurosurgery has been consulted patient is being admitted for further management of lumbar fracture with COVID-19 being positive.     Brief interval history:Patient with a history of atrial fibrillation on chronic anticoagulation with Xarelto, CAD, history of endocarditis, left bundle branch block, history of mitral valve replacement, chronic systolic heart failure, SVT, COPD, chronic venous insufficiency, and hypertension who presented on 10/29 after a fall.  He was found to have an unstable fracture of his lumbar spine but was also found to be positive for (asymptomatic) COVID-19 infection.   While waiting for surgical clearance (increased risk of complications in the setting of surgery with COVID infection) and developed GBS bacteremia, now off antibiotics.  He had multilevel spinal fusion on 11/17.  He was difficult to ventilate in the OR and so kept intubated overnight.  Patient was previously on the hospitalist service.   Currently on RA.  He  is quite deconditioned due to prolonged hospitalization.  He will need aggressive pulmonary toilet.  PT is seeing him.  Neurosurgery is following and he has a drain in place, but he is stable from this standpoint.  He is likely to need rehab - CIR vs. SNF, likely too early to tell which.  TRH will resume care of the patient on 11/21.  11/21: Patient was seen and examined at his bedside this morning.  Reports persistent nonproductive cough and back pain.  States breathing is improved.  Denies any chest pain.  11/22: No new complaints. No acute events overnight.  05/31/19: Patient was seen and examined at bedside this morning.  Reports right hip pain.  Wet cough noted on exam.  Ongoing suctioning by bedside RN.  Denies any chest pain.    Assessment/Plan: Active Problems:   OSA (obstructive sleep apnea)   Type II diabetes mellitus with manifestations (HCC)   Chronic systolic CHF (congestive heart failure) (HCC)   Hyperlipidemia with target LDL less than 70   Chronic atrial fibrillation   Chronic respiratory failure with hypoxia (HCC)   Unstable burst fracture of unspecified lumbar vertebra, initial encounter for closed fracture (HCC)   Closed unstable burst fracture of lumbar vertebra (HCC)   Closed unstable burst fracture of first lumbar vertebra (HCC)   Pressure injury of skin   Bacteremia   Hyponatremia  POD #7 post T11-L4 fusion for L1 Chance fracture. Neurosurgery following Stable, no new changes as per neurosurgery Continue pain management  Acute hypoxic hypercarbic respiratory failure requiring invasive mechanical ventilation likely secondary to COVID-19 pneumonia/COPD exacerbation Not On oxygen supplementation at  baseline Extubated on 05/26/2019 Currently on nasal cannula 3 L with O2 saturation in the upper 90s Continue pulmonary toilet; add hypersaline nebs BID Continue to maintain O2 saturation greater than 92% Frequent suctioning Sats 98% 1L  Acute urinary  retention Indwelling Foley catheter placed on 05/29/2019 Bethanechol started on 05/30/2019, continue Voiding trial, ongoing  Chronic A. Fib Rate is controlled Continue Coreg for rate control  heparin drip for CVA prevention will dc when no planned procedures May need peg tube placement if continues to fail swallow eval  Streptococcous Group G bacteremia, poa Blood cx x 2 positive on 05/10/19. Repeat blood cx x2 done on 05/11/19 staph coag neg x1 (likely contaminant) and other bottle negative growth final. Completed course of treatment Blood cultures drawn on 05/29/2019 due to concern for fever negative times less than 24-hour We will continue to follow cultures  Treated COVID-19 viral pneumonia  Treated with 5 days of remdesivir Continue bronchodilators and Lasix via NG tube Continue to maintain O2 saturation greater than 94%  COPD exacerbation Continue pulmonary toilet Independently viewed chest x-ray which shows mild bibasilar atelectasis. Rest of management as stated above  Dysphagia, coretrack in place Failed swallow evaluation by speech therapist N.p.o. per speech therapy recommendations Continue NG tube for nutrition and medication administration If continues to fail swallow eval may need peg tube  Chronic systolic CHF Last 2D echo showed LVEF 40 to 45% on 05/20/2019. Continue strict I's and O's and daily weight Continue current medication: On Coreg 3.125 mg twice daily, losartan 12.5 mg daily,Torsemide 20 mg daily  GERD Continue PPI  Hyperlipidemia Continue statin  Physical debility/ambulatory dysfunction PT assessment recommended to continue improvement toward CIR. OT assessed and recommended SNF with 24-hour supervision/assistance and 3 in 1 bedside commode. Continue PT OT with assistance and fall precautions   Code Status: Full code  Family Communication: None at bedside.  Disposition Plan: SNF versus CIR when neurosurgery signs  off.  Consultants:  Neurosurgery  PCCM  Procedures: 11/17: PROCEDURE: 2. Open reduction, internal fixation L1-2 Chance fracture 3. Posterior segmental instrumented pedicle screw stabilization, T11-L4 4. Posterolateral arthrodesis, T11-L4 Use of non-structural morcellized bone allograft - DBX 05/25/2019 intubation in OR per anesthesia>>extubated 11/18 Antimicrobials: Penicillin G 11/3-11/17  Significant Hospital Events   10/29 admitted for back pain  10/29 positive Covid test-asymptomatic GBS bacteremia, TEE negative for endocarditis.  Completed antibiotics on 1116 Mild exacerbation of COPD requiring diuresis 11/12 Multilevel spinal stabilization 11/17.   DVT prophylaxis: Heparin drip   Objective: Vitals:   05/31/19 0900 05/31/19 1000 05/31/19 1100 05/31/19 1200  BP: (!) 161/80 (!) 122/104 (!) 112/54   Pulse: 67 64 (!) 51   Resp: 13 19 15    Temp:    (!) 97.2 F (36.2 C)  TempSrc:    Axillary  SpO2: 100% 98% 98%   Weight:      Height:        Intake/Output Summary (Last 24 hours) at 05/31/2019 1353 Last data filed at 05/31/2019 1000 Gross per 24 hour  Intake 1854.73 ml  Output 1325 ml  Net 529.73 ml   Filed Weights   05/29/19 0500 05/30/19 0500 05/31/19 0438  Weight: 113.6 kg 113.6 kg 115.8 kg    Exam:   General: 68 y.o. year-old male well-developed well-nourished in no acute distress.  Interactive.  Cardiovascular: Irregular rate and rhythm no rubs or gallops.  No JVD or thyromegaly noted.    Respiratory: Rales noted upper airway.  Poor inspiratory effort. Abdomen: Obese nontender bowel  sounds present.  Musculoskeletal: Trace edema in lower extremities bilaterally.   Psychiatry: Mood is appropriate for condition and setting..  Data Reviewed: CBC: Recent Labs  Lab 05/27/19 0911 05/28/19 0511 05/29/19 0558 05/29/19 1511 05/30/19 0631 05/31/19 0500  WBC 7.0 5.6 4.4 6.7 4.4 3.8*  NEUTROABS 5.8  --   --  5.6  --   --   HGB 8.1* 7.6* 7.4* 7.9*  7.9* 7.8*  HCT 27.1* 25.2* 25.1* 26.7* 27.0* 27.1*  MCV 92.2 92.6 93.7 93.7 95.4 96.1  PLT 131* 128* 146* 167 173 197   Basic Metabolic Panel: Recent Labs  Lab 05/27/19 0911 05/28/19 0511 05/29/19 0558 05/30/19 0631 05/31/19 0500  NA 131* 135 140 142 144  K 4.3 4.3 4.3 4.5 4.8  CL 97* 98 101 102 104  CO2 28 28 31  34* 35*  GLUCOSE 315* 244* 215* 208* 193*  BUN 34* 39* 38* 42* 43*  CREATININE 0.77 0.70 0.57* 0.61 0.63  CALCIUM 7.7* 7.8* 8.0* 8.1* 8.3*  MG 2.3 2.5*  --   --   --   PHOS 3.4 2.8  --   --   --    GFR: Estimated Creatinine Clearance: 110.9 mL/min (by C-G formula based on SCr of 0.63 mg/dL). Liver Function Tests: No results for input(s): AST, ALT, ALKPHOS, BILITOT, PROT, ALBUMIN in the last 168 hours. No results for input(s): LIPASE, AMYLASE in the last 168 hours. No results for input(s): AMMONIA in the last 168 hours. Coagulation Profile: No results for input(s): INR, PROTIME in the last 168 hours. Cardiac Enzymes: Recent Labs  Lab 05/25/19 1323  CKTOTAL 136  CKMB 1.3   BNP (last 3 results) No results for input(s): PROBNP in the last 8760 hours. HbA1C: No results for input(s): HGBA1C in the last 72 hours. CBG: Recent Labs  Lab 05/30/19 1943 05/30/19 2350 05/31/19 0335 05/31/19 0803 05/31/19 1156  GLUCAP 209* 168* 165* 166* 155*   Lipid Profile: No results for input(s): CHOL, HDL, LDLCALC, TRIG, CHOLHDL, LDLDIRECT in the last 72 hours. Thyroid Function Tests: No results for input(s): TSH, T4TOTAL, FREET4, T3FREE, THYROIDAB in the last 72 hours. Anemia Panel: No results for input(s): VITAMINB12, FOLATE, FERRITIN, TIBC, IRON, RETICCTPCT in the last 72 hours. Urine analysis:    Component Value Date/Time   COLORURINE YELLOW 05/19/2019 1355   APPEARANCEUR CLEAR 05/19/2019 1355   APPEARANCEUR Clear 07/23/2017   LABSPEC 1.013 05/19/2019 1355   PHURINE 5.0 05/19/2019 1355   GLUCOSEU NEGATIVE 05/19/2019 1355   GLUCOSEU 500 (A) 07/14/2017 1053    HGBUR SMALL (A) 05/19/2019 1355   BILIRUBINUR NEGATIVE 05/19/2019 1355   BILIRUBINUR Negative 07/23/2017   KETONESUR NEGATIVE 05/19/2019 1355   PROTEINUR NEGATIVE 05/19/2019 1355   UROBILINOGEN 0.2 07/14/2017 1053   NITRITE NEGATIVE 05/19/2019 1355   LEUKOCYTESUR NEGATIVE 05/19/2019 1355   Sepsis Labs: @LABRCNTIP (procalcitonin:4,lacticidven:4)  ) Recent Results (from the past 240 hour(s))  Surgical pcr screen     Status: None   Collection Time: 05/24/19  7:39 PM   Specimen: Nasal Mucosa; Nasal Swab  Result Value Ref Range Status   MRSA, PCR NEGATIVE NEGATIVE Final   Staphylococcus aureus NEGATIVE NEGATIVE Final    Comment: (NOTE) The Xpert SA Assay (FDA approved for NASAL specimens in patients 40 years of age and older), is one component of a comprehensive surveillance program. It is not intended to diagnose infection nor to guide or monitor treatment. Performed at [facility], [address] [place], [place] [address]   Culture,  blood (routine x 2)     Status: None (Preliminary result)   Collection Time: 05/29/19  4:14 PM   Specimen: BLOOD LEFT HAND  Result Value Ref Range Status   Specimen Description BLOOD LEFT HAND  Final   Special Requests AEROBIC BOTTLE ONLY Blood Culture adequate volume  Final   Culture   Final    NO GROWTH 2 DAYS Performed at [facility], [place] [address] [place], [place] [address]    Report Status PENDING  Incomplete  Culture, blood (routine x 2)     Status: None (Preliminary result)   Collection Time: 05/29/19  4:14 PM   Specimen: BLOOD LEFT HAND  Result Value Ref Range Status   Specimen Description BLOOD LEFT HAND  Final   Special Requests AEROBIC BOTTLE ONLY Blood Culture adequate volume  Final   Culture   Final    NO GROWTH 2 DAYS Performed at [facility], [place] [address] [place], [place] [address]    Report Status PENDING  Incomplete      Studies: No results found.  Scheduled Meds:  ascorbic acid  500 mg Per  Tube Daily   bethanechol  10 mg Per Tube BID   carvedilol  3.125 mg Per Tube BID WC   chlorhexidine  15 mL Mouth Rinse BID   Chlorhexidine Gluconate Cloth  6 each Topical Daily   docusate  100 mg Per Tube Daily   fluticasone  2 spray Each Nare Daily   fluticasone furoate-vilanterol  1 puff Inhalation Daily   insulin aspart  0-20 Units Subcutaneous Q4H   insulin aspart  4 Units Subcutaneous TID WC   insulin detemir  40 Units Subcutaneous BID   loratadine  10 mg Per Tube QPM   losartan  12.5 mg Per Tube Daily   mouth rinse  15 mL Mouth Rinse q12n4p   multivitamin with minerals  1 tablet Per Tube Daily   pantoprazole sodium  40 mg Per Tube Daily   polyethylene glycol  17 g Per Tube Daily   senna-docusate  2 tablet Per Tube BID   simvastatin  20 mg Per Tube q morning - 10a   sodium chloride flush  10-40 mL Intracatheter Q12H   sodium chloride flush  3 mL Intravenous Q12H   torsemide  20 mg Per Tube q1800   umeclidinium bromide  1 puff Inhalation Daily   Vilazodone HCl  40 mg Oral Daily    Continuous Infusions:  sodium chloride Stopped (05/25/19 1944)   feeding supplement (PIVOT 1.5 CAL) 1,000 mL (05/31/19 1336)   heparin 2,450 Units/hr (05/31/19 1320)     LOS: 25 days     Kayleen Memos, MD [facility] Pager [phone]  If 7PM-7AM, please contact night-coverage [email or web address] Password [email or web address] 05/31/2019, 1:53 PM

## 2019-05-31 NOTE — Progress Notes (Signed)
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 Remained intubated until 07/25/18 PMHx: Afib, MVR, SVT, anxiety, CAD, COPD, CHF, HTN,     PT Comments    Per RN, pt was very awake all morning. She gave him pain medicine and he has been resting/quiet ever since. He would arouse and follow some commands, especially helping with rolling to don brace in supine. Due to lethargy, used chair position of ICU bed to achieve upright sitting. BP monitored with transition to upright sitting with appropriate rise in BP (HOB 10 degrees 117/57, 60 degrees 134/87). Patient performed AROM exercises RLE and AAROM LLE (significant quad weakness persists--1+/5; ankle 4/5). Attempted to have pt use UEs to pull to unsupported sitting with +2 max assist, however primarily limited by TLSO approximating with his thighs and preventing upright posture). After session, called [employer] re: brace needing adjustment to allow upright sitting (and even ability to lean torso forward for sit to stand). RN made aware of request to [place].      Follow Up Recommendations  Other (comment)(post acute therapies, will work toward CIR level); (now that BP stabilized, if no functional progress, will need to consider SNF).      Equipment Recommendations  Other (comment)(TBD)    Recommendations for Other Services       Precautions / Restrictions Precautions Precautions: Back;Fall Precaution Booklet Issued: No Precaution  Comments: pt very lethargic; unable to state or educate Required Braces or Orthoses: Spinal Brace Spinal Brace: Thoracolumbosacral orthotic;Applied in supine position Spinal Brace Comments: in full chair position, pt unable to lean forward to unsupported sitting due to brace digging into his thighs    Mobility  Bed Mobility Overal bed mobility: Needs Assistance Bed Mobility: Rolling Rolling: Max assist;+2 for physical assistance         General bed mobility comments: practiced rolling extensively while donning and doffing the brace in supine.  Transfers                 General transfer comment: too lethargic  Ambulation/Gait             General Gait Details: unable   Stairs             Wheelchair Mobility    Modified Rankin (Stroke Patients Only)       Balance                                            Cognition Arousal/Alertness: Lethargic;Suspect due to medications Behavior During Therapy: Flat affect Overall Cognitive Status: (NT formally, likely functional)                                 General Comments: opens eyes briefly; follows commands even with eyes closed      Exercises General Exercises - Lower Extremity Ankle Circles/Pumps: Left;AROM;5 reps Long Arc  Quad: AROM;Right;10 reps;AAROM;Left;5 reps Hip Flexion/Marching: AAROM;Both;Seated(2 reps with little ability to assist )    General Comments General comments (skin integrity, edema, etc.): ICU bed to full chair position; feet ~4 inches from floor; using bed rails attempted pull to unsupported sitting x 3 however brace pushing against his thighs precludes full upright (even with his knees lower than his hips)      Pertinent Vitals/Pain Pain Assessment: Faces Faces Pain Scale: Hurts little more Pain Location: back Pain Descriptors / Indicators: Discomfort;Grimacing(with rolling/donning brace) Pain Intervention(s): Limited activity within patient's  tolerance;Monitored during session;Premedicated before session;Repositioned    Home Living                      Prior Function            PT Goals (current goals can now be found in the care plan section) Acute Rehab PT Goals Patient Stated Goal: get out of this bed Time For Goal Achievement: 06/09/19 Potential to Achieve Goals: Good Progress towards PT goals: Not progressing toward goals - comment(lethargic ?pain meds)    Frequency    Min 5X/week      PT Plan Current plan remains appropriate    Co-evaluation              AM-PAC PT "6 Clicks" Mobility   Outcome Measure  Help needed turning from your back to your side while in a flat bed without using bedrails?: A Lot Help needed moving from lying on your back to sitting on the side of a flat bed without using bedrails?: Total Help needed moving to and from a bed to a chair (including a wheelchair)?: Total Help needed standing up from a chair using your arms (e.g., wheelchair or bedside chair)?: Total Help needed to walk in hospital room?: Total Help needed climbing 3-5 steps with a railing? : Total 6 Click Score: 7    End of Session Equipment Utilized During Treatment: Back brace Activity Tolerance: Patient limited by lethargy Patient left: in bed;with call bell/phone within reach;with SCD's reapplied(in chair-like position HOB 50; foot board on bed) Nurse Communication: Other (comment)(will call [employer] re: brace fit) PT Visit Diagnosis: Other abnormalities of gait and mobility (R26.89);Muscle weakness (generalized) (M62.81);Pain;History of falling (Z91.81) Pain - Right/Left: (bil) Pain - part of body: (legs, knees, back)     Time: 6834-1962 PT Time Calculation (min) (ACUTE ONLY): 41 min  Charges:  $Therapeutic Exercise: 8-22 mins $Therapeutic Activity: 23-37 mins                      Veda Canning, PT Pager [phone]    Zena Amos 05/31/2019, 3:47 PM

## 2019-05-31 NOTE — Progress Notes (Signed)
  NEUROSURGERY PROGRESS NOTE   No issues overnight.  JP Drain removed over the weekend.  EXAM:  BP 137/68 (BP Location: Left Arm)   Pulse (!) 126   Temp 98.3 F (36.8 C) (Axillary)   Resp 14   Ht 5\' 9"  (1.753 m)   Wt 115.8 kg   SpO2 100%   BMI 37.70 kg/m   Awake, alert Speech appropriate Generalized BLE, nonfocal Incision: c/d/i  IMPRESSION/PLAN 68 y.o. male  pod #6 T11-L4 fusion for L1 chance fracture. Generalized weakness, but nonfocal - stable neurologically - up with brace - No new NS recs. Will sign off. Please call for any concerns. Will need staples removed in 1 week.

## 2019-05-31 NOTE — Progress Notes (Signed)
ANTICOAGULATION CONSULT NOTE - Follow Up Consult  Pharmacy Consult for Heparin Indication: atrial fibrillation  Allergies  Allergen Reactions  . Lisinopril Cough  . Daptomycin Rash  . Metformin And Related Diarrhea  . Tape Rash and Other (See Comments)    Adhesive Tape-Burn skin.    Patient Measurements: Height: 5\' 9"  (175.3 cm) Weight: 255 lb 4.7 oz (115.8 kg) IBW/kg (Calculated) : 70.7 Heparin Dosing Weight:    Vital Signs: Temp: 98.3 F (36.8 C) (11/23 0400) Temp Source: Axillary (11/23 0400) BP: 122/104 (11/23 1000) Pulse Rate: 64 (11/23 1000)  Labs: Recent Labs    05/29/19 0558 05/29/19 1511 05/30/19 0438 05/30/19 0631 05/31/19 0500  HGB 7.4* 7.9*  --  7.9* 7.8*  HCT 25.1* 26.7*  --  27.0* 27.1*  PLT 146* 167  --  173 197  HEPARINUNFRC 0.50  --  0.64  --  0.58  CREATININE 0.57*  --   --  0.61 0.63    Estimated Creatinine Clearance: 110.9 mL/min (by C-G formula based on SCr of 0.63 mg/dL).   Assessment:  Anticoag: Heparin for afib (Xarelto PTA for Afib, LD 10/28 AM) - Hgb 7.8 down, plts 197, no bleeding noted. HL 0.58 in goal.  Goal of Therapy:  Heparin level 0.3-0.7 units/ml Monitor platelets by anticoagulation protocol: Yes   Plan:  Cont heparin at 2450 units/hr - plan for PO soon Daily HL and CBC   Allexa Acoff S. Alford Highland, PharmD, BCPS Clinical Staff Pharmacist Eilene Ghazi Stillinger 05/31/2019,10:58 AM

## 2019-05-31 NOTE — Progress Notes (Signed)
At approximately 23:27 pt had long (approximately 30 beats) v-tach showing on the monitor. Upon checking on the pt, SPO2 88% and O2 [place] placed back on the pt. Pt asymptomatic with no changes in mentation and denies chest pain or palpitations. EKG immediately obtained; showing afib with PVC's. It was reported pt has had various EKG rhythms since on/around 11/17. Text page sent to [facility]. Will continue to monitor and contact MD for further if needed.

## 2019-06-01 ENCOUNTER — Encounter (HOSPITAL_COMMUNITY): Payer: Self-pay | Admitting: Cardiology

## 2019-06-01 DIAGNOSIS — I482 Chronic atrial fibrillation, unspecified: Secondary | ICD-10-CM | POA: Diagnosis not present

## 2019-06-01 DIAGNOSIS — R7881 Bacteremia: Secondary | ICD-10-CM | POA: Diagnosis not present

## 2019-06-01 DIAGNOSIS — I472 Ventricular tachycardia: Secondary | ICD-10-CM

## 2019-06-01 LAB — GLUCOSE, CAPILLARY
Glucose-Capillary: 142 mg/dL — ABNORMAL HIGH (ref 70–99)
Glucose-Capillary: 164 mg/dL — ABNORMAL HIGH (ref 70–99)
Glucose-Capillary: 208 mg/dL — ABNORMAL HIGH (ref 70–99)
Glucose-Capillary: 163 mg/dL — ABNORMAL HIGH (ref 70–99)
Glucose-Capillary: 153 mg/dL — ABNORMAL HIGH (ref 70–99)
Glucose-Capillary: 148 mg/dL — ABNORMAL HIGH (ref 70–99)

## 2019-06-01 LAB — CBC
HCT: 27.9 % — ABNORMAL LOW (ref 39.0–52.0)
Hemoglobin: 8.1 g/dL — ABNORMAL LOW (ref 13.0–17.0)
MCH: 27.8 pg (ref 26.0–34.0)
MCHC: 29 g/dL — ABNORMAL LOW (ref 30.0–36.0)
MCV: 95.9 fL (ref 80.0–100.0)
Platelets: 224 10*3/uL (ref 150–400)
RBC: 2.91 MIL/uL — ABNORMAL LOW (ref 4.22–5.81)
RDW: 18.5 % — ABNORMAL HIGH (ref 11.5–15.5)
WBC: 4.3 10*3/uL (ref 4.0–10.5)
nRBC: 0 % (ref 0.0–0.2)

## 2019-06-01 LAB — BASIC METABOLIC PANEL
Anion gap: 6 (ref 5–15)
Anion gap: 7 (ref 5–15)
BUN: 37 mg/dL — ABNORMAL HIGH (ref 8–23)
BUN: 36 mg/dL — ABNORMAL HIGH (ref 8–23)
CO2: 35 mmol/L — ABNORMAL HIGH (ref 22–32)
CO2: 34 mmol/L — ABNORMAL HIGH (ref 22–32)
Calcium: 8.5 mg/dL — ABNORMAL LOW (ref 8.9–10.3)
Calcium: 8.6 mg/dL — ABNORMAL LOW (ref 8.9–10.3)
Chloride: 105 mmol/L (ref 98–111)
Chloride: 105 mmol/L (ref 98–111)
Creatinine, Ser: 0.57 mg/dL — ABNORMAL LOW (ref 0.61–1.24)
Creatinine, Ser: 0.57 mg/dL — ABNORMAL LOW (ref 0.61–1.24)
GFR calc Af Amer: >60 mL/min (ref >60)
GFR calc Af Amer: >60 mL/min (ref >60)
GFR calc non Af Amer: >60 mL/min (ref >60)
GFR calc non Af Amer: >60 mL/min (ref >60)
Glucose, Bld: 179 mg/dL — ABNORMAL HIGH (ref 70–99)
Glucose, Bld: 197 mg/dL — ABNORMAL HIGH (ref 70–99)
Potassium: 4.7 mmol/L (ref 3.5–5.1)
Potassium: 4.7 mmol/L (ref 3.5–5.1)
Sodium: 146 mmol/L — ABNORMAL HIGH (ref 135–145)
Sodium: 146 mmol/L — ABNORMAL HIGH (ref 135–145)

## 2019-06-01 LAB — TROPONIN I (HIGH SENSITIVITY)
Troponin I (High Sensitivity): 18 ng/L — ABNORMAL HIGH (ref <18)
Troponin I (High Sensitivity): 18 ng/L — ABNORMAL HIGH (ref <18)

## 2019-06-01 LAB — MAGNESIUM: Magnesium: 2.5 mg/dL — ABNORMAL HIGH (ref 1.7–2.4)

## 2019-06-01 LAB — PHOSPHORUS: Phosphorus: 3.3 mg/dL (ref 2.5–4.6)

## 2019-06-01 LAB — HEPARIN LEVEL (UNFRACTIONATED): Heparin Unfractionated: 0.56 IU/mL (ref 0.30–0.70)

## 2019-06-01 MED ORDER — ZINC OXIDE 40 % EX OINT
TOPICAL_OINTMENT | Freq: Four times a day (QID) | CUTANEOUS | Status: DC
  Administered 2019-06-01 – 2019-06-03 (×9): via TOPICAL
  Administered 2019-06-03 – 2019-06-04 (×2): 1 via TOPICAL
  Administered 2019-06-04 – 2019-06-05 (×4): via TOPICAL
  Administered 2019-06-05: 1 via TOPICAL
  Administered 2019-06-05 – 2019-06-10 (×18): via TOPICAL
  Administered 2019-06-10: 1 via TOPICAL
  Administered 2019-06-10 (×2): via TOPICAL
  Administered 2019-06-11: 1 via TOPICAL
  Administered 2019-06-11 – 2019-06-12 (×4): via TOPICAL
  Administered 2019-06-12: 1 via TOPICAL
  Administered 2019-06-12 – 2019-06-13 (×3): via TOPICAL
  Administered 2019-06-13: 1 via TOPICAL
  Administered 2019-06-13 – 2019-06-17 (×16): via TOPICAL
  Filled 2019-06-01 (×2): qty 57

## 2019-06-01 MED ORDER — DOCUSATE SODIUM 50 MG/5ML PO LIQD: 100.0000 mg | Freq: Every day | ORAL | Status: DC | PRN

## 2019-06-01 MED ORDER — ALTEPLASE 2 MG IJ SOLR
2.0000 mg | Freq: Once | INTRAMUSCULAR | Status: AC
  Administered 2019-06-01: 10:00:00 2 mg

## 2019-06-01 MED ORDER — CARVEDILOL 6.25 MG PO TABS
6.2500 mg | ORAL_TABLET | Freq: Two times a day (BID) | ORAL | Status: DC
  Administered 2019-06-01 – 2019-06-03 (×5): 6.25 mg
  Filled 2019-06-01 (×2): qty 1
  Filled 2019-06-01: qty 2
  Filled 2019-06-01 (×2): qty 1

## 2019-06-01 MED ORDER — POLYETHYLENE GLYCOL 3350 17 G PO PACK: 17.0000 g | PACK | Freq: Every day | ORAL | Status: DC | PRN

## 2019-06-01 NOTE — Progress Notes (Deleted)
Progress Note  Patient Name: Cody Hale Date of Encounter: 06/01/2019  Primary Cardiologist: Prentice Docker, MD   Subjective   Feeling better - congested cough no chest pain.  Not aware of rapid HR.   Inpatient Medications    Scheduled Meds: . ascorbic acid  500 mg Per Tube Daily  . bethanechol  10 mg Per Tube BID  . carvedilol  3.125 mg Per Tube BID WC  . chlorhexidine  15 mL Mouth Rinse BID  . Chlorhexidine Gluconate Cloth  6 each Topical Daily  . docusate  100 mg Per Tube Daily  . fluticasone  2 spray Each Nare Daily  . fluticasone furoate-vilanterol  1 puff Inhalation Daily  . insulin aspart  0-20 Units Subcutaneous Q4H  . insulin aspart  4 Units Subcutaneous TID WC  . insulin detemir  40 Units Subcutaneous BID  . liver oil-zinc oxide   Topical QID  . loratadine  10 mg Per Tube QPM  . losartan  12.5 mg Per Tube Daily  . mouth rinse  15 mL Mouth Rinse q12n4p  . multivitamin with minerals  1 tablet Per Tube Daily  . pantoprazole sodium  40 mg Per Tube Daily  . polyethylene glycol  17 g Per Tube Daily  . senna-docusate  2 tablet Per Tube BID  . simvastatin  20 mg Per Tube q morning - 10a  . sodium chloride flush  10-40 mL Intracatheter Q12H  . sodium chloride flush  3 mL Intravenous Q12H  . sodium chloride HYPERTONIC  4 mL Nebulization BID  . torsemide  20 mg Per Tube q1800  . umeclidinium bromide  1 puff Inhalation Daily  . Vilazodone HCl  40 mg Oral Daily   Continuous Infusions: . sodium chloride Stopped (05/25/19 1944)  . feeding supplement (PIVOT 1.5 CAL) 1,000 mL (05/31/19 1336)  . heparin 2,450 Units/hr (06/01/19 1116)   PRN Meds: acetaminophen, [DISCONTINUED] acetaminophen **OR** acetaminophen, albuterol, lidocaine (PF), ondansetron **OR** ondansetron (ZOFRAN) IV, oxyCODONE, sodium chloride flush, sodium chloride flush   Vital Signs    Vitals:   06/01/19 0823 06/01/19 0900 06/01/19 1000 06/01/19 1100  BP:  (!) 146/70 (!) 149/69 139/67  Pulse: 88  67 (!) 40 (!) 130  Resp: (!) 21 16 15 16   Temp:      TempSrc:      SpO2:  98% (!) 87% 96%  Weight:      Height:        Intake/Output Summary (Last 24 hours) at 06/01/2019 1204 Last data filed at 06/01/2019 1100 Gross per 24 hour  Intake 919.58 ml  Output 2300 ml  Net -1380.42 ml   Last 3 Weights 05/31/2019 05/30/2019 05/29/2019  Weight (lbs) 255 lb 4.7 oz 250 lb 7.1 oz 250 lb 7.1 oz  Weight (kg) 115.8 kg 113.6 kg 113.6 kg  Some encounter information is confidential and restricted. Go to Review Flowsheets activity to see all data.      Telemetry    A fib mostly rate controlled has occ brif burst of NSVT longest episode was 2327 last pm about 30 sec in length.   - Personally Reviewed  ECG    Last EKG a fib with LBBB and rate control  - Personally Reviewed  Physical Exam   GEN: No acute distress.   Neck: No JVD Cardiac: irreg irreg, no murmurs, rubs, or gallops.  Respiratory: + rhonchi  to auscultation bilaterally. GI: Soft, nontender, non-distended  MS: 1-2+ lower ext edema; No deformity. Neuro:  Nonfocal  Psych: Normal affect   Labs    High Sensitivity Troponin:   Recent Labs  Lab 06/01/19 0050 06/01/19 0316  TROPONINIHS 18* 18*      Chemistry Recent Labs  Lab 05/31/19 0500 06/01/19 0050 06/01/19 0316  NA 144 146* 146*  K 4.8 4.7 4.7  CL 104 105 105  CO2 35* 35* 34*  GLUCOSE 193* 179* 197*  BUN 43* 37* 36*  CREATININE 0.63 0.57* 0.57*  CALCIUM 8.3* 8.5* 8.6*  GFRNONAA >60 >60 >60  GFRAA >60 >60 >60  ANIONGAP 5 6 7      Hematology Recent Labs  Lab 05/30/19 0631 05/31/19 0500 06/01/19 0316  WBC 4.4 3.8* 4.3  RBC 2.83* 2.82* 2.91*  HGB 7.9* 7.8* 8.1*  HCT 27.0* 27.1* 27.9*  MCV 95.4 96.1 95.9  MCH 27.9 27.7 27.8  MCHC 29.3* 28.8* 29.0*  RDW 18.0* 18.1* 18.5*  PLT 173 197 224    BNPNo results for input(s): BNP, PROBNP in the last 168 hours.   DDimer No results for input(s): DDIMER in the last 168 hours.   Radiology    No results  found.  Cardiac Studies   05/20/19  TEE FINDINGS:  LEFT VENTRICLE: EF = 40-45%.  Septal dyskinesis.  RIGHT VENTRICLE: Mild enlargement and mild systolic dysfunction  LEFT ATRIUM: No thrombus/mass.  LEFT ATRIAL APPENDAGE: No thrombus/mass.   RIGHT ATRIUM: No thrombus/mass.  Catheter in right atrium  AORTIC VALVE:  Trileaflet. Trivial regurgitation. No vegetation.  MITRAL VALVE:    S/p bioprosthetic MVR. Trivial regurgitation. No vegetation.  TRICUSPID VALVE: Normal structure. Trivial regurgitation. No vegetation.  PULMONIC VALVE: Grossly normal structure. Trivial regurgitation. No apparent vegetation.  INTERATRIAL SEPTUM: No PFO or ASD seen by color Doppler.  PERICARDIUM: No effusion noted.  DESCENDING AORTA: Moderate diffuse plaque seen   CONCLUSION: No vegetation seen.   Cardiac cath 2014  Angiographic Findings:  Left main: No obstructive disease.   Left Anterior Descending Artery: Large caliber vessel that courses to the apex. There is a moderate caliber diagonal branch. No obstructive disease noted.   Circumflex Artery:  Large caliber vessel that gives rise to several obtuse marginal branches. No obstructive disease noted.   Right Coronary Artery: Large, dominant vessel with 30% eccentric stenosis in the distal vessel. The PDA is large in caliber and has mild plaque disease.   Left Ventricular Angiogram: LVEF 50-55%. Mild MR.   Impression: 1. Mild non-obstructive CAD 2. Preserved LV systolic function. 3. Mild mitral regurgitation 4. Non-cardiac chest pain  Recommendations: Continue medical management.        Complications:  None; patient tolerated the procedure well.                  Patient Profile     68 y.o. 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 For lumbar fracture and had + COVID on admit.  (had respiratory failure on vent and extubated 05/26/19)   His xarelto held.   + bacteremia and TEE 05/20/19  EF 40-45%, no vegetation on bioprosthetic MVR, trivial MR, no vegetation on any valve. . Surgery was done 05/25/19 with open reduction internal fixation L1-2 T11-L4 screw stabilization,  Pt has maintained A fib with LBBB.    Assessment & Plan    NSVT ?  30 beats last evening 23:27 pt with LBBB and chronic a fib.  --has had episodes of STE on tele but with EKG no ST elevation.  --K+ 4.7 and Mg+ 2.5  --EF 40-45%  --troponin hs 18, 18 flat  --Dr Mayford Knife to see.   Recent COVID treated with 5 days of remdesivir with PNA  Along with COPD exacerbation.     Bacteremia with no vegetation on TEE 05/20/19 treated by ID  Chronic combined systolic and diastolic HF with EF 40-45% on TEE.   --with this illness he is not +2400 and wt 115 kg down from admit --on coreg 3.125 BID losartan 12.5 daily and torsemiede 20 daily  Chronic a fib rate mostly controlled.  Was on xarelto as outpt. Now on IV heparin.   Bioprosthetic MV in 2014 and no endocarditis with recent TEE  HTN  Labile with 117/82 to 152/94  Lumbar fracture with surgery 05/25/19  Anemia post op with hgb 8.1 plts 224  DM-2 per IM  HLD on zocor home dose       For questions or updates, please contact [facility] Please consult [email or web address] for contact info under        Signed, Nada Boozer, NP  06/01/2019, 12:04 PM

## 2019-06-01 NOTE — TOC Progression Note (Addendum)
Transition of Care [facility]) - Progression Note    Patient Details  Name: Cody Hale MRN: [id number] Date of Birth: 04/27/1951  Transition of [facility]) CM/SW Contact  Ella Bodo, RN Phone Number: 06/01/2019, 2:13 PM  Clinical Narrative:   FEES results noted.  Pt may need more permanent means of feeding in order to place in SNF, as unable to place with [facility].   TOC team will continue to follow progress.      Expected Discharge Plan: Skilled Nursing Facility Barriers to Discharge: Continued Medical Work up  Expected Discharge Plan and Services Expected Discharge Plan: [place] arrangements for the past 2 months: [place], RN, BSN  Trauma/Neuro ICU Case Manager [phone]

## 2019-06-01 NOTE — Progress Notes (Signed)
MD made aware that patient has had three type 7 bowel movements.

## 2019-06-01 NOTE — Progress Notes (Signed)
Physical Therapy Treatment Patient Details Name: Cody Hale MRN: [id number] DOB: 10/11/1950 Today's Date: 06/01/2019    History of Present Illness Pt is a 68 yo male presented 05/06/19 to ED after fall  (he tripped over some blankets), onto his buttocks with severe back pain;  CT lumbar spine showing unstable fracture L1-2 and likely ankylosing spondylitis; tested +COVID and developed fever and required TEE to r/o endocarditis prior to surgery;  11/17 underwent open reduction internal fixation of L1-L2 Chance fracture with multilevel spine stabilization from T11-L4. Remained intubated until 07/25/18 PMHx: Afib, MVR, SVT, anxiety, CAD, COPD, CHF, HTN,     PT Comments    Patient more alert, although continues with delayed processing. Noted order for donning brace in sitting (previously pt's hypotension was limiting factor) and able to sit at EOB to don brace. Again difficult to get good fit in sitting due to length of brace and pushing up under his chin. Patient required up to mod assist to maintain balance at EOB x ~10 minutes. Investigating his reports of spinning and diplopia (reports new today) and then noted BP (via left lower leg) elevated (170s/120s) with pt reporting worsening dizziness and returned to supine. Patient had + loose BM and assisted with rolling for cleaning and changing linen. Noted pressure wounds on his sacral area (RN aware and re-dressed). Placed in left sidelying.    Follow Up Recommendations  SNF(if progressing, consider CIR)     Equipment Recommendations  Other (comment)(TBD)    Recommendations for Other Services       Precautions / Restrictions Precautions Precautions: Back;Fall Precaution Booklet Issued: No Required Braces or Orthoses: Spinal Brace Spinal Brace: Thoracolumbosacral orthotic;Applied in sitting position(*order is to don in sitting) Spinal Brace Comments: educated Technical brewer contacted and should be by to assess fit and  why Restrictions Weight Bearing Restrictions: No    Mobility  Bed Mobility Overal bed mobility: Needs Assistance Bed Mobility: Rolling Rolling: Max assist;+2 for physical assistance Sidelying to sit: +2 for physical assistance;+2 for safety/equipment;Max assist     Sit to sidelying: +2 for physical assistance;Max assist;+2 for safety/equipment General bed mobility comments: rolling to right and side to sit; step by step cues and incr processing time  Transfers                 General transfer comment: unable to attempt  Ambulation/Gait             General Gait Details: unable   Stairs             Wheelchair Mobility    Modified Rankin (Stroke Patients Only)       Balance Overall balance assessment: Needs assistance Sitting-balance support: Bilateral upper extremity supported;Feet supported Sitting balance-Leahy Scale: Poor Sitting balance - Comments: one person supporting torso from behind him                                    Cognition Arousal/Alertness: Awake/alert Behavior During Therapy: Flat affect Overall Cognitive Status: Impaired/Different from baseline Area of Impairment: Attention;Following commands;Problem solving                   Current Attention Level: Sustained   Following Commands: Follows one step commands with increased time     Problem Solving: Slow processing;Decreased initiation;Requires verbal cues;Requires tactile cues;Difficulty sequencing General Comments: slow to respond despite eyes open; does not appear related to decr hearing  Exercises General Exercises - Lower Extremity Ankle Circles/Pumps: AROM;5 reps;Both    General Comments General comments (skin integrity, edema, etc.): Patient reporting double (and later triple) vision; reported spinning sensation that continued throughout entire session (duration not indicative of BPPV)      Pertinent Vitals/Pain Pain Assessment:  0-10 Pain Score: 9  Faces Pain Scale: Hurts little more Pain Location: back Pain Descriptors / Indicators: Discomfort;Grimacing;Moaning Pain Intervention(s): Limited activity within patient's tolerance;Monitored during session;Repositioned    Home Living                      Prior Function            PT Goals (current goals can now be found in the care plan section) Acute Rehab PT Goals Patient Stated Goal: get out of this bed Time For Goal Achievement: 06/09/19 Potential to Achieve Goals: Good Progress towards PT goals: Progressing toward goals    Frequency    Min 5X/week      PT Plan Discharge plan needs to be updated    Co-evaluation PT/OT/SLP Co-Evaluation/Treatment: Yes Reason for Co-Treatment: For patient/therapist safety PT goals addressed during session: Mobility/safety with mobility;Balance        AM-PAC PT "6 Clicks" Mobility   Outcome Measure  Help needed turning from your back to your side while in a flat bed without using bedrails?: A Lot Help needed moving from lying on your back to sitting on the side of a flat bed without using bedrails?: Total Help needed moving to and from a bed to a chair (including a wheelchair)?: Total Help needed standing up from a chair using your arms (e.g., wheelchair or bedside chair)?: Total Help needed to walk in hospital room?: Total Help needed climbing 3-5 steps with a railing? : Total 6 Click Score: 7    End of Session Equipment Utilized During Treatment: Back brace Activity Tolerance: Treatment limited secondary to medical complications (Comment)(reporting dizziness; BP elevated not dropping) Patient left: in bed;with call bell/phone within reach;with bed alarm set;with nursing/sitter in room;with SCD's reapplied Nurse Communication: Other (comment)(re: brace and [employer]) PT Visit Diagnosis: Other abnormalities of gait and mobility (R26.89);Muscle weakness (generalized) (M62.81);Pain;History of falling  (Z91.81) Pain - Right/Left: (bil) Pain - part of body: (legs, knees, back)     Time: 8466-5993 PT Time Calculation (min) (ACUTE ONLY): 35 min  Charges:  $Therapeutic Activity: 8-22 mins                      Veda Canning, PT Pager [phone]    Zena Amos 06/01/2019, 2:03 PM

## 2019-06-01 NOTE — Progress Notes (Signed)
PROGRESS NOTE  Cody Hale Benjamin ZOX:[id number] DOB: 10-11-1950 DOA: 05/06/2019 PCP: Etta GrandchildJones, Thomas L, MD  HPI/Recap of past 24 hours: Cody Hale Skalla is a 68 y.o. 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  Patient fell onto his buttock.  Started hurting and was brought to the ER.  Denies any incontinence of urine or bowel.  Patient's pain has been significant.  ED Course: In the ER at [facility] MRI showed unstable lumbar fracture and patient was [facility].  Patient had Covid test done which was positive.  Patient otherwise is denying any shortness of breath has been a some productive cough chest x-ray was showing cardiomegaly and nothing acute.  Other labs show BNP of 326 LDH 331 CRP 14.7 WBC 11.9 hemoglobin 13.8 platelets 104 creatinine 1.3.  Neurosurgery has been consulted patient is being admitted for further management of lumbar fracture with COVID-19 being positive.     Brief interval history:Patient with a history of atrial fibrillation on chronic anticoagulation with Xarelto, CAD, history of endocarditis, left bundle branch block, history of mitral valve replacement, chronic systolic heart failure, SVT, COPD, chronic venous insufficiency, and hypertension who presented on 10/29 after a fall.  He was found to have an unstable fracture of his lumbar spine but was also found to be positive for (asymptomatic) COVID-19 infection.   While waiting for surgical clearance (increased risk of complications in the setting of surgery with COVID infection) and developed GBS bacteremia, now off antibiotics.  He had multilevel spinal fusion on 11/17.  He was difficult to ventilate in the OR and so kept intubated overnight.  Patient was previously on the hospitalist service.   Currently on RA.  He  is quite deconditioned due to prolonged hospitalization.  He will need aggressive pulmonary toilet.  PT is seeing him.  Neurosurgery is following and he has a drain in place, but he is stable from this standpoint.  He is likely to need rehab - CIR vs. SNF, likely too early to tell which.  TRH will resume care of the patient on 11/21.  11/21: Persistent nonproductive cough and back pain.  Breathing is improved.  No chest pain.  11/23: Right hip pain.  Wet cough noted on exam.  Ongoing suctioning by bedside RN.   06/01/19: Patient was seen and examined at his bedside this morning.  Cough is improved.  Bedside RN reports 30 beats of V. tach overnight, asymptomatic.  Twelve-lead EKG showed A. fib with frequent PVCs.  Last TEE done on 05/20/2019 showed LVEF 40 to 45%.  Cardiology consulted.     Assessment/Plan: Active Problems:   OSA (obstructive sleep apnea)   Type II diabetes mellitus with manifestations (HCC)   Chronic systolic CHF (congestive heart failure) (HCC)   Hyperlipidemia with target LDL less than 70   Chronic atrial fibrillation   Chronic respiratory failure with hypoxia (HCC)   Unstable burst fracture of unspecified lumbar vertebra, initial encounter for closed fracture (HCC)   Closed unstable burst fracture of lumbar vertebra (HCC)   Closed unstable burst fracture of first lumbar vertebra (HCC)   Pressure injury of skin   Bacteremia   Hyponatremia  POD #8 post T11-L4 fusion for L1 Chance fracture. Seen by neurosurgery, no new recommendations, signed off on 05/31/2019. Pain management and bowel regimen as needed.  Improving acute  hypoxic hypercarbic respiratory failure requiring invasive mechanical ventilation likely secondary to COVID-19 pneumonia versus acute COPD exacerbation Not On oxygen supplementation at baseline Extubated on 05/26/2019 Currently on nasal cannula 3 L with O2 saturation in the upper 90s Continue pulmonary toilet; add hypersaline nebs BID Continue to  maintain O2 saturation greater than 92% Continue frequent suctioning Sats 98% 1L  Nonsustained V. Tach 30 runs of V. tach overnight, asymptomatic Twelve-lead EKG shows A. fib with frequent PVCs TEE done on 05/20/2019 showed LVEF 40 to 45% Cardiology consulted Continue to monitor on telemetry  Acute urinary retention Indwelling Foley catheter placed on 05/29/2019 Bethanechol started on 05/30/2019, continue We will do voiding trial on 06/01/2019.  Chronic A. Fib Rate is controlled Continue Coreg for rate control  heparin drip for CVA prevention will dc when no planned procedures May need peg tube placement if continues to fail swallow eval  Streptococcous Group G bacteremia, poa Blood cx x 2 positive on 05/10/19. Repeat blood cx x2 done on 05/11/19 staph coag neg x1 (likely contaminant) and other bottle negative growth final. Completed course of treatment Blood cultures drawn on 05/29/2019 due to concern for fever negative times less than 24-hour We will continue to follow cultures  Treated COVID-19 viral pneumonia  Treated with 5 days of remdesivir Continue bronchodilators and Lasix via NG tube Continue to maintain O2 saturation greater than 94%  COPD exacerbation Continue pulmonary toilet Independently viewed chest x-ray which shows mild bibasilar atelectasis. Rest of management as stated above  Dysphagia, coretrack in place Failed swallow evaluation by speech therapist N.p.o. per speech therapy recommendations Continue NG tube for nutrition and medication administration If continues to fail swallow eval may need peg tube for DC planning  Chronic systolic CHF Last 2D echo showed LVEF 40 to 45% on 05/20/2019. Continue strict I's and O's and daily weight Continue current medication: On Coreg 3.125 mg twice daily, losartan 12.5 mg daily,Torsemide 20 mg daily  GERD Continue PPI  Hyperlipidemia Continue statin  Physical debility/ambulatory dysfunction PT assessment  recommended to continue improvement toward CIR. OT assessed and recommended SNF with 24-hour supervision/assistance and 3 in 1 bedside commode. Continue PT OT with assistance and fall precautions CSW consulted to assist with placement   Code Status: Full code  Family Communication: None at bedside.  Disposition Plan: SNF in the next 48 to 72 hours.  Consultants:  Neurosurgery, signed off on 05/31/2019.  PCCM  Cardiology consulted on 06/01/2019.  Procedures: 11/17: PROCEDURE: 2. Open reduction, internal fixation L1-2 Chance fracture 3. Posterior segmental instrumented pedicle screw stabilization, T11-L4 4. Posterolateral arthrodesis, T11-L4 Use of non-structural morcellized bone allograft - DBX 05/25/2019 intubation in OR per anesthesia>>extubated 11/18 Antimicrobials: Penicillin G 11/3-11/17  Significant Hospital Events   10/29 admitted for back pain  10/29 positive Covid test-asymptomatic GBS bacteremia, TEE negative for endocarditis.  Completed antibiotics on 1116 Mild exacerbation of COPD requiring diuresis 11/12 Multilevel spinal stabilization 11/17.   DVT prophylaxis: Heparin drip   Objective: Vitals:   06/01/19 0823 06/01/19 0900 06/01/19 1000 06/01/19 1100  BP:  (!) 146/70 (!) 149/69 139/67  Pulse: 88 67 (!) 40 (!) 130  Resp: (!) 21 16 15 16   Temp:      TempSrc:      SpO2:  98% (!) 87% 96%  Weight:      Height:        Intake/Output Summary (Last 24 hours) at 06/01/2019 1139 Last data filed at 06/01/2019 1100 Gross per 24 hour  Intake 1088.52 ml  Output 2300 ml  Net -1211.48 ml   Filed Weights   05/29/19 0500 05/30/19 0500 05/31/19 0438  Weight: 113.6 kg 113.6 kg 115.8 kg    Exam:  . General: 68 y.o. year-old male well-developed well-nourished in no acute distress.  Alert and interactive.   . Cardiovascular: Irregular rate and rhythm no rubs or gallops. Marland Kitchen Respiratory: Mild rales at bases.  No wheezing noted.  Poor inspiratory effort.  Abdomen: Obese nontender nondistended no bowel sounds present.  Musculoskeletal: Trace edema in lower extremities bilaterally. Psychiatry: Mood is appropriate for condition and setting.  Data Reviewed: CBC: Recent Labs  Lab 05/27/19 0911  05/29/19 0558 05/29/19 1511 05/30/19 0631 05/31/19 0500 06/01/19 0316  WBC 7.0   < > 4.4 6.7 4.4 3.8* 4.3  NEUTROABS 5.8  --   --  5.6  --   --   --   HGB 8.1*   < > 7.4* 7.9* 7.9* 7.8* 8.1*  HCT 27.1*   < > 25.1* 26.7* 27.0* 27.1* 27.9*  MCV 92.2   < > 93.7 93.7 95.4 96.1 95.9  PLT 131*   < > 146* 167 173 197 224   < > = values in this interval not displayed.   Basic Metabolic Panel: Recent Labs  Lab 05/27/19 0911 05/28/19 0511 05/29/19 0558 05/30/19 0631 05/31/19 0500 06/01/19 0050 06/01/19 0316  NA 131* 135 140 142 144 146* 146*  K 4.3 4.3 4.3 4.5 4.8 4.7 4.7  CL 97* 98 101 102 104 105 105  CO2 28 28 31  34* 35* 35* 34*  GLUCOSE 315* 244* 215* 208* 193* 179* 197*  BUN 34* 39* 38* 42* 43* 37* 36*  CREATININE 0.77 0.70 0.57* 0.61 0.63 0.57* 0.57*  CALCIUM 7.7* 7.8* 8.0* 8.1* 8.3* 8.5* 8.6*  MG 2.3 2.5*  --   --   --  2.5*  --   PHOS 3.4 2.8  --   --   --  3.3  --    GFR: Estimated Creatinine Clearance: 110.9 mL/min (A) (by C-G formula based on SCr of 0.57 mg/dL (L)). Liver Function Tests: No results for input(s): AST, ALT, ALKPHOS, BILITOT, PROT, ALBUMIN in the last 168 hours. No results for input(s): LIPASE, AMYLASE in the last 168 hours. No results for input(s): AMMONIA in the last 168 hours. Coagulation Profile: No results for input(s): INR, PROTIME in the last 168 hours. Cardiac Enzymes: Recent Labs  Lab 05/25/19 1323  CKTOTAL 136  CKMB 1.3   BNP (last 3 results) No results for input(s): PROBNP in the last 8760 hours. HbA1C: No results for input(s): HGBA1C in the last 72 hours. CBG: Recent Labs  Lab 05/31/19 1537 05/31/19 1953 05/31/19 2341 06/01/19 0335 06/01/19 0819  GLUCAP 139* 115* 142* 164* 208*    Lipid Profile: No results for input(s): CHOL, HDL, LDLCALC, TRIG, CHOLHDL, LDLDIRECT in the last 72 hours. Thyroid Function Tests: No results for input(s): TSH, T4TOTAL, FREET4, T3FREE, THYROIDAB in the last 72 hours. Anemia Panel: No results for input(s): VITAMINB12, FOLATE, FERRITIN, TIBC, IRON, RETICCTPCT in the last 72 hours. Urine analysis:    Component Value Date/Time   COLORURINE YELLOW 05/19/2019 [place] 05/19/2019 1355   APPEARANCEUR Clear 07/23/2017   LABSPEC 1.013 05/19/2019 1355   PHURINE 5.0 05/19/2019 1355   GLUCOSEU NEGATIVE 05/19/2019 1355   GLUCOSEU 500 (A) 07/14/2017 1053   HGBUR SMALL (A) 05/19/2019 1355   BILIRUBINUR NEGATIVE 05/19/2019 1355   BILIRUBINUR Negative 07/23/2017   KETONESUR NEGATIVE 05/19/2019  1355   PROTEINUR NEGATIVE 05/19/2019 1355   UROBILINOGEN 0.2 07/14/2017 1053   NITRITE NEGATIVE 05/19/2019 1355   LEUKOCYTESUR NEGATIVE 05/19/2019 1355   Sepsis Labs: @LABRCNTIP (procalcitonin:4,lacticidven:4)  ) Recent Results (from the past 240 hour(s))  Surgical pcr screen     Status: None   Collection Time: 05/24/19  7:39 PM   Specimen: Nasal Mucosa; Nasal Swab  Result Value Ref Range Status   MRSA, PCR NEGATIVE NEGATIVE Final   Staphylococcus aureus NEGATIVE NEGATIVE Final    Comment: (NOTE) The Xpert SA Assay (FDA approved for NASAL specimens in patients 36 years of age and older), is one component of a comprehensive surveillance program. It is not intended to diagnose infection nor to guide or monitor treatment. Performed at [facility], [address], [place], [place]   Culture, blood (routine x 2)     Status: None (Preliminary result)   Collection Time: 05/29/19  4:14 PM   Specimen: BLOOD LEFT HAND  Result Value Ref Range Status   Specimen Description BLOOD LEFT HAND  Final   Special Requests AEROBIC BOTTLE ONLY Blood Culture adequate volume  Final   Culture   Final    NO GROWTH 2 DAYS Performed at  [facility], [address] [place], [place]    Report Status PENDING  Incomplete  Culture, blood (routine x 2)     Status: None (Preliminary result)   Collection Time: 05/29/19  4:14 PM   Specimen: BLOOD LEFT HAND  Result Value Ref Range Status   Specimen Description BLOOD LEFT HAND  Final   Special Requests AEROBIC BOTTLE ONLY Blood Culture adequate volume  Final   Culture   Final    NO GROWTH 2 DAYS Performed at [name], [address] [place], [place]    Report Status PENDING  Incomplete      Studies: No results found.  Scheduled Meds: . ascorbic acid  500 mg Per Tube Daily  . bethanechol  10 mg Per Tube BID  . carvedilol  3.125 mg Per Tube BID WC  . chlorhexidine  15 mL Mouth Rinse BID  . Chlorhexidine Gluconate Cloth  6 each Topical Daily  . docusate  100 mg Per Tube Daily  . fluticasone  2 spray Each Nare Daily  . fluticasone furoate-vilanterol  1 puff Inhalation Daily  . insulin aspart  0-20 Units Subcutaneous Q4H  . insulin aspart  4 Units Subcutaneous TID WC  . insulin detemir  40 Units Subcutaneous BID  . liver oil-zinc oxide   Topical QID  . loratadine  10 mg Per Tube QPM  . losartan  12.5 mg Per Tube Daily  . mouth rinse  15 mL Mouth Rinse q12n4p  . multivitamin with minerals  1 tablet Per Tube Daily  . pantoprazole sodium  40 mg Per Tube Daily  . polyethylene glycol  17 g Per Tube Daily  . senna-docusate  2 tablet Per Tube BID  . simvastatin  20 mg Per Tube q morning - 10a  . sodium chloride flush  10-40 mL Intracatheter Q12H  . sodium chloride flush  3 mL Intravenous Q12H  . sodium chloride HYPERTONIC  4 mL Nebulization BID  . torsemide  20 mg Per Tube q1800  . umeclidinium bromide  1 puff Inhalation Daily  . Vilazodone HCl  40 mg Oral Daily    Continuous Infusions: . sodium chloride Stopped (05/25/19 1944)  . feeding supplement (PIVOT 1.5 CAL)  1,000 mL (05/31/19 1336)  . heparin 2,450 Units/hr (06/01/19 1116)      LOS: 26 days     Darlin Drop, MD [facility] Pager [phone]  If 7PM-7AM, please contact night-coverage [email or web address] Password [facility] 06/01/2019, 11:39 AM

## 2019-06-01 NOTE — Progress Notes (Signed)
  Speech Language Pathology Treatment: Dysphagia  Patient Details Name: Cody Hale MRN: [id number] DOB: [birth year] Today's Date: 06/01/2019 Time: 6160-7371 SLP Time Calculation (min) (ACUTE ONLY): 16 min  Assessment / Plan / Recommendation Clinical Impression  Pt was encountered awake/alert with baseline cough upon SLP arrival.  SLP completed oral care with suction toothbrush prior to po trials.  Pt completed effortful swallows with ice chips x10 given min-mod verbal cues.  He exhibited an immediate cough following 2/10 trials.  He was then educated regarding benefits of respiratory muscle training in regards to swallow function and how to use the Threshold PEP Respiratory Muscle Trainer.  Different levels of resistance were tested and it was eventually set on level 8.  Pt exhibited some difficulty coordinating expirations through the RMT and he required moderate verbal and tactile cues to properly use RMT x10.  SLP will continue to f/u for dysphagia tx per POC.     HPI HPI: Cody Hale is a 68 y.o. year old male with medical history significant for combined systolic/diastolic CHF, prosthetic mitral valve replacement with history of endocarditis in 2014, atrial fibrillation, COPD, diabetes who presented on 05/06/2019 with buttocks and back pain after a mechanical fall at home and was found to have unstable lumbar fracture and was transferred from any pain hospital to [facility]. Hospital course complicated by [id number] infection positive with no symptoms though he did complete 5 days of remdesivir and full course of heparin drip GBS bacteremia IV penicillin. Pt noted to grimace with pills.       SLP Plan  Continue with current plan of care       Recommendations  Diet recommendations: NPO Medication Administration: Via alternative means                Oral Care Recommendations: Oral care QID;Staff/trained caregiver to provide oral care Follow up Recommendations: Skilled Nursing  facility SLP Visit Diagnosis: Dysphagia, pharyngeal phase (R13.13) Plan: Continue with current plan of care       GO               Colin Mulders M.S., [place] Office: [phone]  [place] 06/01/2019, 12:08 PM

## 2019-06-01 NOTE — Consult Note (Signed)
WOC Nurse Consult Note: Patient receiving care in [place].  Consult completed remotely after review of record and ELink camera consultation with assistance of primary RN, T.K. and NT [place] [employer]. Reason for Consult: "wound in sacral area" Wound type: MASD, fecal incontinence, to right buttock area to the right of the medical gluteal fold Measurement: see flowsheet Wound bed: pink/red, superficial surrounded by blanchable purple Drainage (amount, consistency, odor) none Periwound: purple, but blanches Dressing procedure/placement/frequency: 4 times daily application of 03% Desitin. Off loading to area. Monitor the wound area(s) for worsening of condition such as: Signs/symptoms of infection,  Increase in size,  Development of or worsening of odor, Development of pain, or increased pain at the affected locations.  Notify the medical team if any of these develop.  Thank you for the consult.  Discussed plan of care with the patient and bedside nurse.  [place] nurse will not follow at this time.  Please re-consult the [place] team if needed.  Val Riles, RN, MSN, CWOCN, CNS-BC, pager [phone]

## 2019-06-01 NOTE — Progress Notes (Signed)
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 Remained intubated until 07/25/18 PMHx: Afib, MVR, SVT, anxiety, CAD, COPD, CHF, HTN,    OT comments  Pt noted to have wound on buttock that has purple border so RN called to room to visualize wound. Recommend frequent skin checks due to incontinence of bowel and rotation for lateral weight shift every 2 hours minimum to give pressure relief. Pt demonstrates cognitive deficits and now reporting diplopia of object over over the other with static sitting. Pt unable to tolerate sustain sitting at this time.   Follow Up Recommendations  SNF;Supervision/Assistance - 24 hour    Equipment Recommendations  3 in 1 bedside commode    Recommendations for Other Services      Precautions / Restrictions Precautions Precautions: Back;Fall Precaution Booklet Issued: No Required Braces or Orthoses: Spinal Brace Spinal Brace: Thoracolumbosacral orthotic;Applied in sitting position(*order is to don in sitting) Spinal Brace Comments: educated RN that [employer] contacted and should be by to assess fit and why Restrictions Weight Bearing Restrictions: No       Mobility Bed Mobility Overal bed mobility: Needs Assistance Bed Mobility: Rolling Rolling: Max assist;+2 for physical assistance Sidelying to sit: +2 for physical assistance;+2 for safety/equipment;Max assist     Sit to sidelying: +2 for physical assistance;Max assist;+2 for  safety/equipment General bed mobility comments: rolling to right and side to sit; step by step cues and incr processing time  Transfers                 General transfer comment: unable to attempt    Balance Overall balance assessment: Needs assistance Sitting-balance support: Bilateral upper extremity supported;Feet supported Sitting balance-Leahy Scale: Poor Sitting balance - Comments: one person supporting torso from behind him                                   ADL either performed or assessed with clinical judgement   ADL Overall ADL's : Needs assistance/impaired Eating/Feeding: NPO   Grooming: Maximal assistance;Sitting(hand over hand)                                 General ADL Comments: total +2 total (A) to don brace at EOB. pt unable to sustain his attention at the eob. pt c/o spinning and seeing double one over the other of therapist and requesting return to supine     Vision       Perception     Praxis      Cognition Arousal/Alertness: Awake/alert Behavior During Therapy: Flat affect Overall Cognitive Status: Impaired/Different from baseline Area of Impairment: Attention;Following commands;Problem solving                   Current Attention Level: Sustained   Following Commands: Follows one step commands with increased time     Problem Solving: Slow processing;Decreased initiation;Requires verbal cues;Requires tactile cues;Difficulty sequencing  General Comments: slow to respond despite eyes open; does not appear related to decr hearing, pt reports seeing 1 then 2 then 3 as if he is counting in sequence but then when asked at 3 states 3          Exercises General Exercises - Lower Extremity Ankle Circles/Pumps: AROM;5 reps;Both Other Exercises Other Exercises: educated on back precaution to log roll and body positioning Other Exercises: pt with pain with rolling R in L side. pt tolerates laying on L side more  than R side due to what patient calls a "cramp" down the entire L side of this body    Shoulder Instructions       General Comments pt reporting dobule vision one over the other and when supine reports seeing 3    Pertinent Vitals/ Pain       Pain Assessment: 0-10 Pain Score: 9  Faces Pain Scale: Hurts little more Pain Location: back Pain Descriptors / Indicators: Discomfort;Grimacing;Moaning Pain Intervention(s): Monitored during session;Repositioned  Home Living                                          Prior Functioning/Environment              Frequency  Min 2X/week        Progress Toward Goals  OT Goals(current goals can now be found in the care plan section)  Progress towards OT goals: Progressing toward goals  Acute Rehab OT Goals Patient Stated Goal: to get something to drink  OT Goal Formulation: With patient Time For Goal Achievement: 06/09/19 Potential to Achieve Goals: Good ADL Goals Pt Will Perform Grooming: with min assist;sitting Pt Will Perform Upper Body Dressing: with min assist;sitting Pt Will Transfer to Toilet: stand pivot transfer;bedside commode;with mod assist Pt/caregiver will Perform Home Exercise Program: Both right and left upper extremity;Increased strength;With minimal assist;With written HEP provided Additional ADL Goal #1: Pt will follow 100% commands in 4/6 trials with 90% accuracy.  Plan Discharge plan remains appropriate    Co-evaluation    PT/OT/SLP Co-Evaluation/Treatment: Yes Reason for Co-Treatment: For patient/therapist safety;To address functional/ADL transfers;Necessary to address cognition/behavior during functional activity PT goals addressed during session: Mobility/safety with mobility;Balance OT goals addressed during session: ADL's and self-care;Proper use of Adaptive equipment and DME;Strengthening/ROM      AM-PAC OT "6 Clicks" Daily Activity     Outcome Measure   Help from another person  eating meals?: Total Help from another person taking care of personal grooming?: A Lot Help from another person toileting, which includes using toliet, bedpan, or urinal?: Total Help from another person bathing (including washing, rinsing, drying)?: Total Help from another person to put on and taking off regular upper body clothing?: A Lot Help from another person to put on and taking off regular lower body clothing?: Total 6 Click Score: 8    End of Session Equipment Utilized During Treatment: Back brace  OT Visit Diagnosis: Unsteadiness on feet (R26.81);Muscle weakness (generalized) (M62.81);Pain   Activity Tolerance Patient limited by pain   Patient Left in bed;with call bell/phone within reach;with bed alarm set   Nurse Communication Mobility status;Precautions        Time: 8101-7510 OT Time Calculation (min): 35 min  Charges: OT General Charges $OT Visit: 1 Visit OT Treatments $Self Care/Home Management : 8-22 mins   [place], OTR/L  Acute Rehabilitation Services Pager: [phone] Office:  [phone] .    Jeri Modena 06/01/2019, 2:31 PM
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
## 2019-06-01 NOTE — Progress Notes (Signed)
ANTICOAGULATION CONSULT NOTE - Follow Up Consult  Pharmacy Consult for Heparin Indication: atrial fibrillation  Allergies  Allergen Reactions  . Lisinopril Cough  . Daptomycin Rash  . Metformin And Related Diarrhea  . Tape Rash and Other (See Comments)    Adhesive Tape-Burn skin.    Patient Measurements: Height: 5\' 9"  (175.3 cm) Weight: 255 lb 4.7 oz (115.8 kg) IBW/kg (Calculated) : 70.7 Heparin Dosing Weight:    Vital Signs: Temp: 98.4 F (36.9 C) (11/24 0400) Temp Source: Axillary (11/24 0400) BP: 145/77 (11/24 0700) Pulse Rate: 88 (11/24 0823)  Labs: Recent Labs    05/30/19 0438  05/30/19 0631 05/31/19 0500 06/01/19 0050 06/01/19 0316 06/01/19 0602  HGB  --   --  7.9* 7.8*  --  8.1*  --   HCT  --   --  27.0* 27.1*  --  27.9*  --   PLT  --   --  173 197  --  224  --   HEPARINUNFRC 0.64  --   --  0.58  --   --  0.56  CREATININE  --    < > 0.61 0.63 0.57* 0.57*  --   TROPONINIHS  --   --   --   --  18* 18*  --    < > = values in this interval not displayed.    Estimated Creatinine Clearance: 110.9 mL/min (A) (by C-G formula based on SCr of 0.57 mg/dL (L)).   Assessment:  Anticoag: Heparin for afib (Xarelto PTA for Afib, LD 10/28 AM) - Hgb 7.8>>8.1 today, plts 224, no bleeding noted. HL 0.56 in goal.  Goal of Therapy:  Heparin level 0.3-0.7 units/ml Monitor platelets by anticoagulation protocol: Yes   Plan:  Cont heparin at 2450 units/hr  Daily HL and CBC   Keiyon Plack S. Alford Highland, PharmD, BCPS Clinical Staff Pharmacist Eilene Ghazi Stillinger 06/01/2019,8:26 AM

## 2019-06-02 ENCOUNTER — Inpatient Hospital Stay (HOSPITAL_COMMUNITY): Payer: Medicare Other

## 2019-06-02 DIAGNOSIS — R7881 Bacteremia: Secondary | ICD-10-CM | POA: Diagnosis not present

## 2019-06-02 DIAGNOSIS — I5022 Chronic systolic (congestive) heart failure: Secondary | ICD-10-CM | POA: Diagnosis not present

## 2019-06-02 DIAGNOSIS — I472 Ventricular tachycardia: Secondary | ICD-10-CM | POA: Diagnosis not present

## 2019-06-02 DIAGNOSIS — I4729 Other ventricular tachycardia: Secondary | ICD-10-CM

## 2019-06-02 DIAGNOSIS — I482 Chronic atrial fibrillation, unspecified: Secondary | ICD-10-CM | POA: Diagnosis not present

## 2019-06-02 LAB — BASIC METABOLIC PANEL
Anion gap: 9 (ref 5–15)
BUN: 29 mg/dL — ABNORMAL HIGH (ref 8–23)
CO2: 32 mmol/L (ref 22–32)
Calcium: 8.4 mg/dL — ABNORMAL LOW (ref 8.9–10.3)
Chloride: 104 mmol/L (ref 98–111)
Creatinine, Ser: 0.6 mg/dL — ABNORMAL LOW (ref 0.61–1.24)
GFR calc Af Amer: >60 mL/min (ref >60)
GFR calc non Af Amer: >60 mL/min (ref >60)
Glucose, Bld: 196 mg/dL — ABNORMAL HIGH (ref 70–99)
Potassium: 4.3 mmol/L (ref 3.5–5.1)
Sodium: 145 mmol/L (ref 135–145)

## 2019-06-02 LAB — GLUCOSE, CAPILLARY
Glucose-Capillary: 128 mg/dL — ABNORMAL HIGH (ref 70–99)
Glucose-Capillary: 137 mg/dL — ABNORMAL HIGH (ref 70–99)
Glucose-Capillary: 161 mg/dL — ABNORMAL HIGH (ref 70–99)
Glucose-Capillary: 170 mg/dL — ABNORMAL HIGH (ref 70–99)
Glucose-Capillary: 185 mg/dL — ABNORMAL HIGH (ref 70–99)
Glucose-Capillary: 185 mg/dL — ABNORMAL HIGH (ref 70–99)
Glucose-Capillary: 189 mg/dL — ABNORMAL HIGH (ref 70–99)

## 2019-06-02 LAB — HEMOGLOBIN AND HEMATOCRIT, BLOOD
HCT: 28.6 % — ABNORMAL LOW (ref 39.0–52.0)
Hemoglobin: 8.5 g/dL — ABNORMAL LOW (ref 13.0–17.0)

## 2019-06-02 LAB — HEPARIN LEVEL (UNFRACTIONATED): Heparin Unfractionated: 0.48 IU/mL (ref 0.30–0.70)

## 2019-06-02 LAB — MAGNESIUM: Magnesium: 2.3 mg/dL (ref 1.7–2.4)

## 2019-06-02 MED ORDER — HEPARIN (PORCINE) 25000 UT/250ML-% IV SOLN
2250.0000 [IU]/h | INTRAVENOUS | Status: DC
  Administered 2019-06-02 – 2019-06-03 (×3): 2450 [IU]/h via INTRAVENOUS
  Filled 2019-06-02 (×4): qty 250

## 2019-06-02 MED ORDER — CEFAZOLIN SODIUM-DEXTROSE 2-4 GM/100ML-% IV SOLN
2.0000 g | INTRAVENOUS | Status: AC
  Administered 2019-06-04: 2 g via INTRAVENOUS
  Filled 2019-06-02: qty 100

## 2019-06-02 NOTE — Consult Note (Signed)
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 COMPARISON:  May 21, 2019. FINDINGS: Stable cardiomegaly. Feeding tube is seen entering stomach. Right-sided PICC line is unchanged. No pneumothorax is noted. Mild bibasilar atelectasis is noted. Bony thorax is unremarkable. IMPRESSION: Mild bibasilar subsegmental atelectasis. Electronically Signed   By: Marijo Conception M.D.   On: 05/27/2019 07:27   Dg Chest Port 1 View  Result Date: 05/21/2019 CLINICAL DATA:  Dyspnea and aspiration EXAM: PORTABLE CHEST 1 VIEW COMPARISON:  May 18, 2019 FINDINGS: The mediastinal contour and cardiac silhouette are stable. A feeding tube is identified with distal tip not included on film. Right central venous line is identified with distal tip in the superior vena cava. The heart size is enlarged. Increased pulmonary interstitium is identified bilaterally. There is no focal pneumonia or pleural effusion. The bony structures are stable. IMPRESSION: Cardiomegaly with mild  interstitial edema. Electronically Signed  By: Abelardo Diesel M.D.   On: 05/21/2019 12:31   Dg Chest Port 1 View  Result Date: 05/18/2019 CLINICAL DATA:  Pneumonia. EXAM: PORTABLE CHEST 1 VIEW COMPARISON:  May 15, 2019. FINDINGS: Stable cardiomegaly. No pneumothorax or pleural effusion is noted. Right-sided PICC line is noted with distal tip in expected position of cavoatrial junction. No acute pulmonary disease is noted. Bony thorax is unremarkable. IMPRESSION: Right-sided PICC line is been repositioned with tip in expected position of cavoatrial junction. No acute cardiopulmonary abnormality seen. Electronically Signed   By: Marijo Conception M.D.   On: 05/18/2019 10:52   Dg Chest Port 1 View  Result Date: 05/15/2019 CLINICAL DATA:  68 year old male with a history of PICC placement EXAM: PORTABLE CHEST 1 VIEW COMPARISON:  None. FINDINGS: Cardiomediastinal silhouette unchanged in size and contour with surgical changes of median sternotomy and cardiomegaly. No pneumothorax. No pleural effusion. Coarsened interstitial markings throughout. Right upper extremity PICC is redundant, without visualization of the point of termination of the tip. Redemonstration of partially healed right-sided rib fractures IMPRESSION: Right upper extremity PICC is redundant within the veins in the tip is not visualized. Repositioning is indicated. Similar appearance of the lungs with diffuse reticulonodular opacities. Similar appearance of surgical changes of median sternotomy and cardiomegaly. Electronically Signed   By: Corrie Mckusick D.O.   On: 05/15/2019 11:13   Dg Chest Port 1 View  Result Date: 05/10/2019 CLINICAL DATA:  68 year old male positive for COVID-19. Fever. EXAM: PORTABLE CHEST 1 VIEW COMPARISON:  Portable chest 05/06/2019 and earlier. FINDINGS: Portable AP semi upright view at 1645 hours. Chronic cardiomegaly. Mildly lower lung volumes compared to October. New bilateral basilar predominant confluent and  indistinct pulmonary opacity. No superimposed pneumothorax or pleural effusion. Prior sternotomy. No acute osseous abnormality identified. IMPRESSION: 1. New from 05/06/2019 bilateral basilar predominant bilateral pulmonary opacity compatible with COVID-19 pneumonia. 2. Chronic cardiomegaly. Electronically Signed   By: Genevie Ann M.D.   On: 05/10/2019 17:04   Dg Chest Portable 1 View  Result Date: 05/06/2019 CLINICAL DATA:  Fall. EXAM: PORTABLE CHEST 1 VIEW COMPARISON:  Chest x-ray dated January 13, 2019. FINDINGS: Stable cardiomegaly. Pulmonary vascular congestion. No focal consolidation, pleural effusion, or pneumothorax. No acute osseous abnormality. Old right-sided rib fractures again noted. IMPRESSION: Cardiomegaly with pulmonary vascular congestion. Electronically Signed   By: Titus Dubin M.D.   On: 05/06/2019 19:11   Dg C-arm 1-60 Min  Result Date: 05/25/2019 CLINICAL DATA:  Lumbar fracture fixation EXAM: DG C-ARM 1-60 MIN FLUOROSCOPY TIME:  Fluoroscopy Time:  2 minutes 35 seconds Number of Acquired Spot Images: 0 COMPARISON:  None. FINDINGS: Rod and pedicle screw fixation hardware spanning T11-L4. IMPRESSION: Fluoroscopic guidance for posterior lumbar fusion. Refer to operative report for additional information. Electronically Signed   By: Macy Mis M.D.   On: 05/25/2019 13:37   Vas [place] Lower Extremity Venous (dvt)  Result Date: 05/08/2019  Lower Venous Study Indications: Covid positive, elevated D-Dimer. Has unstable lumbar fracture.  Limitations: Patient in severe pain with compression and body habitus. Comparison Study: No prior study on file for comparison Performing Technologist: Sharion Dove RVS  Examination Guidelines: A complete evaluation includes B-mode imaging, spectral Doppler, color Doppler, and power Doppler as needed of all accessible portions of each vessel. Bilateral testing is considered an integral part of a complete examination. Limited examinations for reoccurring  indications may be performed as noted.  +---------+---------------+---------+-----------+----------+--------------+  RIGHT     Compressibility Phasicity Spontaneity Properties Thrombus Aging  +---------+---------------+---------+-----------+----------+--------------+  CFV       Full            Yes       Yes                                    +---------+---------------+---------+-----------+----------+--------------+  SFJ       Full                                                             +---------+---------------+---------+-----------+----------+--------------+  FV Prox   Full                                                             +---------+---------------+---------+-----------+----------+--------------+  FV Mid    Full                                                             +---------+---------------+---------+-----------+----------+--------------+  FV Distal Full                                                             +---------+---------------+---------+-----------+----------+--------------+  PFV       Full                                                             +---------+---------------+---------+-----------+----------+--------------+  POP       Full            Yes       Yes                                    +---------+---------------+---------+-----------+----------+--------------+  PTV       Full                                                             +---------+---------------+---------+-----------+----------+--------------+  PERO      Full                                                             +---------+---------------+---------+-----------+----------+--------------+   +---------+---------------+---------+-----------+----------+--------------+  LEFT      Compressibility Phasicity Spontaneity Properties Thrombus Aging  +---------+---------------+---------+-----------+----------+--------------+  CFV       Full            Yes       Yes                                     +---------+---------------+---------+-----------+----------+--------------+  SFJ       Full                                                             +---------+---------------+---------+-----------+----------+--------------+  FV Prox   Full                                                             +---------+---------------+---------+-----------+----------+--------------+  FV Mid    Full                                                             +---------+---------------+---------+-----------+----------+--------------+  FV Distal Full                                                             +---------+---------------+---------+-----------+----------+--------------+  PFV       Full                                                             +---------+---------------+---------+-----------+----------+--------------+  POP       Full            Yes       Yes                                    +---------+---------------+---------+-----------+----------+--------------+  PTV       Full                                                             +---------+---------------+---------+-----------+----------+--------------+  PERO      Full                                                             +---------+---------------+---------+-----------+----------+--------------+  Summary: Right: There is no evidence of deep vein thrombosis in the lower extremity. Left: There is no evidence of deep vein thrombosis in the lower extremity.  *See table(s) above for measurements and observations. Electronically signed by Deitra Mayo MD on 05/08/2019 at 5:56:44 PM.    Final    Ir Picc Placement Right >5 Yrs Inc Img Guide  Result Date: 05/17/2019 INDICATION: BACTEREMIA, ACCESS FOR LONG-TERM ANTIBIOTICS EXAM: ULTRASOUND AND FLUOROSCOPIC GUIDED PICC LINE INSERTION MEDICATIONS: 1% LIDOCAINE LOCAL CONTRAST:  None FLUOROSCOPY TIME:  120 seconds (12 mGy) COMPLICATIONS: None immediate. TECHNIQUE: The procedure, risks,  benefits, and alternatives were explained to the patient's family and informed written consent was obtained. A timeout was performed prior to the initiation of the procedure. The right upper extremity was prepped with chlorhexidine in a sterile fashion, and a sterile drape was applied covering the operative field. Maximum barrier sterile technique with sterile gowns and gloves were used for the procedure. A timeout was performed prior to the initiation of the procedure. Local anesthesia was provided with 1% lidocaine. Under direct ultrasound guidance, the right brachial vein was accessed with a micropuncture kit after the overlying soft tissues were anesthetized with 1% lidocaine. An ultrasound image was saved for documentation purposes. A guidewire was advanced to the level of the superior caval-atrial junction for measurement purposes and the PICC line was cut to length. A peel-away sheath was placed and a 45 cm, 5 [place], dual lumen was inserted to level of the superior caval-atrial junction. A post procedure spot fluoroscopic was obtained. The catheter easily aspirated and flushed and was sutured in place. A dressing was placed. The patient tolerated the procedure well without immediate post procedural complication. FINDINGS: After catheter placement, the tip lies within the superior cavoatrial junction. The catheter aspirates and flushes normally and is ready for immediate use. IMPRESSION: Successful ultrasound and fluoroscopic guided placement of a right brachial vein approach, 45 cm, 5 French, dual lumen PICC with tip at the superior caval-atrial junction. The PICC line is ready for immediate use. Electronically Signed   By: Jerilynn Mages.  Shick M.D.   On: 05/17/2019 16:35   [place] Ekg Site Rite  Result Date: 05/14/2019 If Site Rite image not attached, placement could not be confirmed due to current cardiac rhythm.   Labs:  CBC: Recent Labs    05/29/19 1511 05/30/19 0631 05/31/19 0500 06/01/19 0316  WBC 6.7  4.4 3.8* 4.3  HGB 7.9* 7.9* 7.8* 8.1*  HCT 26.7* 27.0* 27.1* 27.9*  PLT 167 173 197 224    COAGS: Recent Labs    05/06/19 1552  05/09/19 1225 05/09/19 1948 05/10/19 0406 05/11/19 2126  INR 1.2  --   --   --   --   --   APTT 36   < > 74* 78* 90* 96*   < > = values in this interval not displayed.    BMP: Recent Labs    05/31/19 0500 06/01/19 0050 06/01/19 0316 06/02/19 0643  NA 144 146* 146* 145  K 4.8 4.7 4.7 4.3  CL 104 105 105 104  CO2 35* 35* 34* 32  GLUCOSE 193* 179* 197* 196*  BUN 43* 37* 36* 29*  CALCIUM 8.3* 8.5* 8.6* 8.4*  CREATININE 0.63 0.57* 0.57* 0.60*  GFRNONAA >60 >60 >60 >60  GFRAA >60 >60 >60 >60    LIVER FUNCTION TESTS: Recent Labs    05/12/19 0344 05/15/19 0334 05/17/19 0521 05/19/19 0942  BILITOT 1.2 1.6* 1.1 1.2  AST 16 15  20 41  ALT '17 13 16 19  '$ ALKPHOS 69 64 62 65  PROT 5.7* 5.6* 5.3* 5.4*  ALBUMIN 2.2* 2.0* 1.9* 1.7*    TUMOR MARKERS: No results for input(s): AFPTM, CEA, CA199, CHROMGRNA in the last 8760 hours.  Assessment and Plan:  Dysphagia Malnutrition Long term care Tentatively scheduled for percutaneous gastric tube placement in IR 11/27 Will need hep off 3 hrs before procedure-- we will call RN Risks and benefits image guided gastrostomy tube placement was discussed with pts daughter [name] via phone including, but not limited to the need for a barium enema during the procedure, bleeding, infection, peritonitis and/or damage to adjacent structures.  All her questions were answered, she is agreeable to proceed. Consent signed and in chart.   Thank you for this interesting consult.  I greatly enjoyed meeting Donzel Mohler and look forward to participating in their care.  A copy of this report was sent to the requesting provider on this date.  Electronically Signed: Lavonia Drafts, PA-C 06/02/2019, 1:09 PM   I spent a total of 40 Minutes    in face to face in clinical consultation, greater than 50% of which was  counseling/coordinating care for percutaneous gastric tube placement

## 2019-06-02 NOTE — Progress Notes (Addendum)
ANTICOAGULATION CONSULT NOTE - Follow Up Consult  Pharmacy Consult for Heparin Indication: atrial fibrillation  Allergies  Allergen Reactions  . Lisinopril Cough  . Daptomycin Rash  . Metformin And Related Diarrhea  . Tape Rash and Other (See Comments)    Adhesive Tape-Burn skin.    Patient Measurements: Height: 5\' 9"  (175.3 cm) Weight: 252 lb 4.8 oz (114.4 kg) IBW/kg (Calculated) : 70.7 Heparin Dosing Weight:    Vital Signs: Temp: 99.1 F (37.3 C) (11/25 0800) Temp Source: Axillary (11/25 0800) BP: 159/63 (11/25 0800) Pulse Rate: 73 (11/25 0800)  Labs: Recent Labs    05/31/19 0500 06/01/19 0050 06/01/19 0316 06/01/19 0602 06/02/19 0543 06/02/19 0643  HGB 7.8*  --  8.1*  --   --   --   HCT 27.1*  --  27.9*  --   --   --   PLT 197  --  224  --   --   --   HEPARINUNFRC 0.58  --   --  0.56 0.48  --   CREATININE 0.63 0.57* 0.57*  --   --  0.60*  TROPONINIHS  --  18* 18*  --   --   --     Estimated Creatinine Clearance: 110.3 mL/min (A) (by C-G formula based on SCr of 0.6 mg/dL (L)).   Assessment: 68 yo m on Heparin for afib (Xarelto PTA for Afib, LD 10/28 AM) - holding po until patient has no more procedures, currently deciding whether peg will be needed or not  Hep lvl within goal  No cbc today  Goal of Therapy:  Heparin level 0.3-0.7 units/ml Monitor platelets by anticoagulation protocol: Yes   Plan:  Cont heparin at 2450 units/hr  Daily HL and CBC  Barth Kirks, PharmD, BCPS, BCCCP Clinical Pharmacist [phone]  Please check AMION for all [place] numbers  06/02/2019 8:54 AM   ADDENDUM:  Requested to hold heparin d/t RP hematomas  Will follow daily and discuss with MD when to resume

## 2019-06-02 NOTE — Progress Notes (Signed)
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 Remained intubated until 07/25/18 PMHx: Afib, MVR, SVT, anxiety, CAD, COPD, CHF, HTN,     PT Comments    Patient more alert, however once sitting EOB and donning brace began reporting incr pain LLE (arthritis) and need to have BM. Pt is unable to safely transfer to [facility], therefore required return to supine for bedpan. RN/NT in room and handed off his care to them. Consider Vital-Go bed to assist with progressing this pt to standing. Discussed with unit assistant director however she is not sure he will qualify as he can sit at EOB. Will continue to investigate.    Follow Up Recommendations  SNF(if progressing, consider CIR)     Equipment Recommendations  Other (comment)(TBD)    Recommendations for Other Services       Precautions / Restrictions Precautions Precautions: Back;Fall Precaution Booklet Issued: No Precaution Comments: more alert; pt instructed step by step to adhere to precautions Required Braces or Orthoses: Spinal Brace Spinal Brace: Thoracolumbosacral orthotic;Applied in sitting position(*order is to don in sitting) Restrictions Weight Bearing Restrictions: No    Mobility  Bed Mobility Overal bed mobility: Needs Assistance Bed Mobility: Rolling Rolling: Max assist;+2 for physical assistance Sidelying to sit: +2 for physical assistance;+2 for safety/equipment;Mod assist     Sit to sidelying: +2 for  physical assistance;+2 for safety/equipment;Mod assist General bed mobility comments: rolling to right and side to sit; step by step cues and incr processing time  Transfers                    Ambulation/Gait                 Stairs             Wheelchair Mobility    Modified Rankin (Stroke Patients Only)       Balance Overall balance assessment: Needs assistance Sitting-balance support: Bilateral upper extremity supported;Feet supported Sitting balance-Leahy Scale: Poor Sitting balance - Comments: one person can support from in front of him                                    Cognition Arousal/Alertness: Awake/alert Behavior During Therapy: Flat affect Overall Cognitive Status: Impaired/Different from baseline Area of Impairment: Attention;Following commands;Problem solving                   Current Attention Level: Sustained   Following Commands: Follows one step commands with increased time     Problem Solving: Slow processing;Decreased initiation;Requires verbal cues;Requires tactile cues;Difficulty sequencing General Comments: slow to respond despite eyes open; does not appear related to decr hearing, Asking to lie down due to need to have BM (not safe/able to transfer to [facility])      Exercises General Exercises - Lower Extremity Heel Slides: AAROM;5 reps;Left(resisted extension RLE stronger than LLE)    General Comments General comments (skin integrity, edema, etc.): No pain LLE  with AAROM prior to sitting up; once seated he began complaining of LLE pain and need to return to bed for BM therefore sitting EOB only ~5-7 minutes (donning and then doffing brace)      Pertinent Vitals/Pain Pain Assessment: 0-10 Pain Score: 8 (at end of session) Pain Location: back Pain Descriptors / Indicators: Discomfort;Grimacing;Moaning Pain Intervention(s): Limited activity within patient's tolerance;Monitored during  session;Repositioned;Patient requesting pain meds-RN notified;Relaxation    Home Living                      Prior Function            PT Goals (current goals can now be found in the care plan section) Acute Rehab PT Goals Patient Stated Goal: to get comfortable Time For Goal Achievement: 06/09/19 Potential to Achieve Goals: Good Progress towards PT goals: Progressing toward goals    Frequency    Min 5X/week      PT Plan Current plan remains appropriate    Co-evaluation              AM-PAC PT "6 Clicks" Mobility   Outcome Measure  Help needed turning from your back to your side while in a flat bed without using bedrails?: A Lot Help needed moving from lying on your back to sitting on the side of a flat bed without using bedrails?: A Lot Help needed moving to and from a bed to a chair (including a wheelchair)?: Total Help needed standing up from a chair using your arms (e.g., wheelchair or bedside chair)?: Total Help needed to walk in hospital room?: Total Help needed climbing 3-5 steps with a railing? : Total 6 Click Score: 8    End of Session Equipment Utilized During Treatment: Back brace Activity Tolerance: Treatment limited secondary to medical complications (Comment)(incr pain LLE) Patient left: in bed;with call bell/phone within reach;with nursing/sitter in room;with SCD's reapplied Nurse Communication: Mobility status;Need for lift equipment PT Visit Diagnosis: Other abnormalities of gait and mobility (R26.89);Muscle weakness (generalized) (M62.81);Pain;History of falling (Z91.81) Pain - Right/Left: (bil) Pain - part of body: (legs, knees, back)     Time: 0240-9735 PT Time Calculation (min) (ACUTE ONLY): 28 min  Charges:  $Therapeutic Activity: 23-37 mins                      Veda Canning, PT Pager [phone]    Zena Amos 06/02/2019, 12:41 PM

## 2019-06-02 NOTE — Plan of Care (Signed)
  Problem: Nutrition: Goal: Adequate nutrition will be maintained Outcome: Progressing Note: Pt with continuous tubefeedings Pivot 1.5 at goal, rate 53ml/hr for enteral nutrition. Denies any nausea/vomiting. Aspirations precautions in place. He does have small periods of diarrhea.  Will continue to monitor.   Problem: Pain Managment: Goal: General experience of comfort will improve Outcome: Progressing Note: Pt c/c of pain to lower back and L knee. Pain is being addressed by PRN medications, comfort measures, and distraction, but has had minimal relief. Pt re-assessed as needed. Will continue to monitor for effectiveness.     Problem: Skin Integrity: Goal: Risk for impaired skin integrity will decrease Outcome: Progressing Note: Pt with pressure injury to sacral area. Patient with braden score of 13. Patient has been educated on importance of, and methods to, maintain integrity of skin and mucous membranes and explained on turning schedule and why RN and staff will be performed Q2 hours turns while in bed for pressure re-distribution. Please see notes and flowsheets for additional details on interactions.

## 2019-06-02 NOTE — Progress Notes (Signed)
SLP Cancellation Note  Patient Details Name: Cody Hale MRN: [id number] DOB: [birth year]   Cancelled treatment:        Pt in significant pain when ST arrived asking for pain meds. He declined therapy for dysphagia today. RN notified pt in pain. Will follow up.   [name] 06/02/2019, 3:59 PM  [name].Ed Risk analyst [phone] Office [phone]

## 2019-06-02 NOTE — Progress Notes (Signed)
PROGRESS NOTE  Cody Hale ZOX:[id number] DOB: 1950/09/18 DOA: 05/06/2019 PCP: [name], MD  HPI/Recap of past 24 hours: Cody Hale is a 68 y.o. 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  Patient fell onto his buttock.  Started hurting and was brought to the ER.  Denies any incontinence of urine or bowel.  Patient's pain has been significant.  ED Course: In the ER at [facility] MRI showed unstable lumbar fracture and patient was transferred to [facility].  Patient had Covid test done which was positive.  Patient otherwise is denying any shortness of breath has been a some productive cough chest x-ray was showing cardiomegaly and nothing acute.  Other labs show BNP of 326 LDH 331 CRP 14.7 WBC 11.9 hemoglobin 13.8 platelets 104 creatinine 1.3.  Neurosurgery has been consulted patient is being admitted for further management of lumbar fracture with COVID-19 being positive.     Brief interval history:Patient with a history of atrial fibrillation on chronic anticoagulation with Xarelto, CAD, history of endocarditis, left bundle branch block, history of mitral valve replacement, chronic systolic heart failure, SVT, COPD, chronic venous insufficiency, and hypertension who presented on 10/29 after a fall.  He was found to have an unstable fracture of his lumbar spine but was also found to be positive for (asymptomatic) COVID-19 infection.   While waiting for surgical clearance (increased risk of complications in the setting of surgery with COVID infection) and developed GBS bacteremia, now off antibiotics.  He had multilevel spinal fusion on 11/17.  He was difficult to ventilate in the OR and so kept intubated overnight.  Patient was previously on the hospitalist service.   Currently on RA.  He  is quite deconditioned due to prolonged hospitalization.  He will need aggressive pulmonary toilet.  PT is seeing him.  Neurosurgery is following and he has a drain in place, but he is stable from this standpoint.  He is likely to need rehab - CIR vs. SNF, likely too early to tell which.  TRH will resume care of the patient on 11/21.  11/21: Persistent nonproductive cough and back pain.  Breathing is improved.  No chest pain.  11/23: Right hip pain.  Wet cough noted on exam.  Ongoing suctioning by bedside RN.   11/24: Patient was seen and examined at his bedside this morning.  Cough is improved.  Bedside RN reports 30 beats of V. tach overnight, asymptomatic.  Twelve-lead EKG showed A. fib with frequent PVCs.  Last TEE done on 05/20/2019 showed LVEF 40 to 45%.  Cardiology consulted.   06/02/19: seen and examined at his bedside this morning More alert and interactive. Discussed with IR for possible peg tube placement on 06/04/19. CT abd pelvis done to look at the structure showed incidentally found retroperitoneal hematomas affecting b/l iliacus musculature similar in hind site to lumbar spine CT performed 05/25/2019. Correlation with hematocrit levels is advised. Will hold hep drip until repeat H&H results.    Assessment/Plan: Active Problems:   OSA (obstructive sleep apnea)   Type II diabetes mellitus with manifestations (HCC)   Chronic systolic CHF (congestive heart failure) (HCC)   Hyperlipidemia with target LDL less than 70   Chronic atrial fibrillation   Chronic respiratory failure with hypoxia (HCC)   Unstable burst fracture of unspecified lumbar vertebra, initial encounter for  closed fracture (HCC)   Closed unstable burst fracture of lumbar vertebra (HCC)   Closed unstable burst fracture of first lumbar vertebra (HCC)   Pressure injury of skin   Bacteremia   Hyponatremia   NSVT (nonsustained ventricular tachycardia) (HCC)  POD #9 post T11-L4 fusion for L1 Chance fracture. Seen  by neurosurgery, no new recommendations, signed off on 05/31/2019. Pain management and bowel regimen as needed.  Retroperitoneal Hematomas affecting iliacus musculature similar in hind site to lumbar spine CT performed 05/25/2019. Correlation with hematocrit levels is advised. Will hold hep drip until repeat H&H results.  Dysphagia, coretrack in place Failed swallow evaluation by speech therapist consistently IR consulted. Plan for peg tube placement on 06/04/19. NPO after midnight and off blood thinners for at least 3 hrs  Improving acute hypoxic hypercarbic respiratory failure requiring invasive mechanical ventilation likely secondary to COVID-19 pneumonia versus acute COPD exacerbation Not On oxygen supplementation at baseline Extubated on 05/26/2019 Currently on nasal cannula 3 L with O2 saturation in the upper 90s Continue pulmonary toilet; add hypersaline nebs BID Continue to maintain O2 saturation greater than 92% Continue frequent suctioning Sats 98% 1L  Nonsustained V. Tach 30 runs of V. tach overnight, asymptomatic Twelve-lead EKG shows A. fib with frequent PVCs TEE done on 05/20/2019 showed LVEF 40 to 45% Cardiology consulted Continue to monitor on telemetry  Acute urinary retention Indwelling Foley catheter placed on 05/29/2019 Bethanechol started on 05/30/2019, continue We will do voiding trial on 06/01/2019.  Chronic A. Fib Rate is controlled Continue Coreg for rate control DC hep drip for now  Streptococcous Group G bacteremia, poa Blood cx x 2 positive on 05/10/19. Repeat blood cx x2 done on 05/11/19 staph coag neg x1 (likely contaminant) and other bottle negative growth final. Completed course of treatment Blood cultures drawn on 05/29/2019 due to concern for fever negative times less than 24-hour We will continue to follow cultures  Treated COVID-19 viral pneumonia  Treated with 5 days of remdesivir Continue bronchodilators and Lasix via NG  tube Continue to maintain O2 saturation greater than 94%  COPD exacerbation Continue pulmonary toilet Independently viewed chest x-ray which shows mild bibasilar atelectasis. Rest of management as stated above  Chronic systolic CHF Last 2D echo showed LVEF 40 to 45% on 05/20/2019. Continue strict I's and O's and daily weight Continue current medication: On Coreg 3.125 mg twice daily, losartan 12.5 mg daily,Torsemide 20 mg daily  GERD Continue PPI  Hyperlipidemia Continue statin  Physical debility/ambulatory dysfunction PT assessment recommended to continue improvement toward CIR. OT assessed and recommended SNF with 24-hour supervision/assistance and 3 in 1 bedside commode. Continue PT OT with assistance and fall precautions CSW consulted to assist with placement   Code Status: Full code  Family Communication: None at bedside.  Disposition Plan: SNF in the next 48 to 72 hours.  Consultants:  Neurosurgery, signed off on 05/31/2019.  PCCM  Cardiology consulted on 06/01/2019.  Procedures: 11/17: PROCEDURE: 2. Open reduction, internal fixation L1-2 Chance fracture 3. Posterior segmental instrumented pedicle screw stabilization, T11-L4 4. Posterolateral arthrodesis, T11-L4 Use of non-structural morcellized bone allograft - DBX 05/25/2019 intubation in OR per anesthesia>>extubated 11/18 Antimicrobials: Penicillin G 11/3-11/17  Significant Hospital Events   10/29 admitted for back pain  10/29 positive Covid test-asymptomatic GBS bacteremia, TEE negative for endocarditis.  Completed antibiotics on 1116 Mild exacerbation of COPD requiring diuresis 11/12 Multilevel spinal stabilization 11/17.   DVT prophylaxis: SCds  Objective: Vitals:   06/02/19 0800 06/02/19 1206 06/02/19 1207 06/02/19 1210  BP: (!) 159/63   (!) 137/56  Pulse: 73   65  Resp: 18   19  Temp: 99.1 F (37.3 C)   99.5 F (37.5 C)  TempSrc: Axillary   Axillary  SpO2: 95% 95% 95% 100%  Weight:       Height:        Intake/Output Summary (Last 24 hours) at 06/02/2019 1326 Last data filed at 06/02/2019 0900 Gross per 24 hour  Intake 1580.94 ml  Output 1040 ml  Net 540.94 ml   Filed Weights   05/30/19 0500 05/31/19 0438 06/02/19 0440  Weight: 113.6 kg 115.8 kg 114.4 kg    Exam:   General: 68 y.o. year-old male WD WN NAD alert and interactive.  Cardiovascular: IRRR no rubs or gallops Respiratory: Mild rales bases. No wheezes Abdomen: Obese NT ND NBS  Musculoskeletal: Trace edema LE bilaterally. Psychiatry: Mood is appropriate.  Data Reviewed: CBC: Recent Labs  Lab 05/27/19 0911  05/29/19 0558 05/29/19 1511 05/30/19 0631 05/31/19 0500 06/01/19 0316  WBC 7.0   < > 4.4 6.7 4.4 3.8* 4.3  NEUTROABS 5.8  --   --  5.6  --   --   --   HGB 8.1*   < > 7.4* 7.9* 7.9* 7.8* 8.1*  HCT 27.1*   < > 25.1* 26.7* 27.0* 27.1* 27.9*  MCV 92.2   < > 93.7 93.7 95.4 96.1 95.9  PLT 131*   < > 146* 167 173 197 224   < > = values in this interval not displayed.   Basic Metabolic Panel: Recent Labs  Lab 05/27/19 0911 05/28/19 0511  05/30/19 0631 05/31/19 0500 06/01/19 0050 06/01/19 0316 06/02/19 0643  NA 131* 135   < > 142 144 146* 146* 145  K 4.3 4.3   < > 4.5 4.8 4.7 4.7 4.3  CL 97* 98   < > 102 104 105 105 104  CO2 28 28   < > 34* 35* 35* 34* 32  GLUCOSE 315* 244*   < > 208* 193* 179* 197* 196*  BUN 34* 39*   < > 42* 43* 37* 36* 29*  CREATININE 0.77 0.70   < > 0.61 0.63 0.57* 0.57* 0.60*  CALCIUM 7.7* 7.8*   < > 8.1* 8.3* 8.5* 8.6* 8.4*  MG 2.3 2.5*  --   --   --  2.5*  --  2.3  PHOS 3.4 2.8  --   --   --  3.3  --   --    < > = values in this interval not displayed.   GFR: Estimated Creatinine Clearance: 110.3 mL/min (A) (by C-G formula based on SCr of 0.6 mg/dL (L)). Liver Function Tests: No results for input(s): AST, ALT, ALKPHOS, BILITOT, PROT, ALBUMIN in the last 168 hours. No results for input(s): LIPASE, AMYLASE in the last 168 hours. No results for input(s):  AMMONIA in the last 168 hours. Coagulation Profile: No results for input(s): INR, PROTIME in the last 168 hours. Cardiac Enzymes: No results for input(s): CKTOTAL, CKMB, CKMBINDEX, TROPONINI in the last 168 hours. BNP (last 3 results) No results for input(s): PROBNP in the last 8760 hours. HbA1C: No results for input(s): HGBA1C in the last 72 hours. CBG: Recent Labs  Lab 06/01/19 1955 06/02/19 0009 06/02/19 0449 06/02/19 0842 06/02/19 1208  GLUCAP 148* 161* 189* 185* 137*   Lipid Profile: No results for input(s): CHOL, HDL, LDLCALC, TRIG, CHOLHDL, LDLDIRECT in the last 72 hours. Thyroid Function Tests: No results  for input(s): TSH, T4TOTAL, FREET4, T3FREE, THYROIDAB in the last 72 hours. Anemia Panel: No results for input(s): VITAMINB12, FOLATE, FERRITIN, TIBC, IRON, RETICCTPCT in the last 72 hours. Urine analysis:    Component Value Date/Time   COLORURINE YELLOW 05/19/2019 1355   APPEARANCEUR CLEAR 05/19/2019 1355   APPEARANCEUR Clear 07/23/2017   LABSPEC 1.013 05/19/2019 1355   PHURINE 5.0 05/19/2019 1355   GLUCOSEU NEGATIVE 05/19/2019 1355   GLUCOSEU 500 (A) 07/14/2017 1053   HGBUR SMALL (A) 05/19/2019 1355   BILIRUBINUR NEGATIVE 05/19/2019 1355   BILIRUBINUR Negative 07/23/2017   KETONESUR NEGATIVE 05/19/2019 1355   PROTEINUR NEGATIVE 05/19/2019 1355   UROBILINOGEN 0.2 07/14/2017 1053   NITRITE NEGATIVE 05/19/2019 1355   LEUKOCYTESUR NEGATIVE 05/19/2019 1355   Sepsis Labs: @LABRCNTIP (procalcitonin:4,lacticidven:4)  ) Recent Results (from the past 240 hour(s))  Surgical pcr screen     Status: None   Collection Time: 05/24/19  7:39 PM   Specimen: Nasal Mucosa; Nasal Swab  Result Value Ref Range Status   MRSA, PCR NEGATIVE NEGATIVE Final   Staphylococcus aureus NEGATIVE NEGATIVE Final    Comment: (NOTE) The Xpert SA Assay (FDA approved for NASAL specimens in patients 52 years of age and older), is one component of a comprehensive surveillance program. It is  not intended to diagnose infection nor to guide or monitor treatment. Performed at [facility], [address], [place], [place] [address]   Culture, blood (routine x 2)     Status: None (Preliminary result)   Collection Time: 05/29/19  4:14 PM   Specimen: BLOOD LEFT HAND  Result Value Ref Range Status   Specimen Description BLOOD LEFT HAND  Final   Special Requests AEROBIC BOTTLE ONLY Blood Culture adequate volume  Final   Culture   Final    NO GROWTH 4 DAYS Performed at [facility], [address] [place], [place] [address]    Report Status PENDING  Incomplete  Culture, blood (routine x 2)     Status: None (Preliminary result)   Collection Time: 05/29/19  4:14 PM   Specimen: BLOOD LEFT HAND  Result Value Ref Range Status   Specimen Description BLOOD LEFT HAND  Final   Special Requests AEROBIC BOTTLE ONLY Blood Culture adequate volume  Final   Culture   Final    NO GROWTH 4 DAYS Performed at [facility], [address] [place], [place] [address]    Report Status PENDING  Incomplete      Studies: Ct Abdomen Wo Contrast  Addendum Date: 06/02/2019   ADDENDUM REPORT: 06/02/2019 11:48 ADDENDUM: Critical Value/emergent results were called by telephone at the time of interpretation on 06/02/2019 at 11:47 am to provider Lynnette Caffey, IR PA, who verbally acknowledged these results and will follow-up with the providing clinical team. Electronically Signed   By: Simonne Come M.D.   On: 06/02/2019 11:48   Result Date: 06/02/2019 CLINICAL DATA:  Evaluate anatomy prior to potential percutaneous gastrostomy tube placement. EXAM: CT ABDOMEN WITHOUT CONTRAST TECHNIQUE: Multidetector CT imaging of the abdomen was performed following the standard protocol without IV contrast. COMPARISON:  Chest CT-05/20/2019; lumbar spine CT-05/22/2019; 05/11/2019 FINDINGS: The lack of intravenous contrast limits the ability to evaluate solid abdominal organs. Lower chest: Limited  visualization of the lower thorax demonstrates trace bilateral effusions and associated bibasilar subsegmental atelectasis, suboptimally evaluated due to patient respiratory artifact. Cardiomegaly. Post mitral valve repair. Coronary artery calcifications. No pericardial effusion. Hepatobiliary: Nodularity hepatic contour as could be seen in  the setting cirrhotic change. Apparent high density material within the gallbladder could represent cholelithiasis though could be artifactual due to streak artifact from the patient's paraspinal fusion hardware. No definitive gallbladder wall thickening on this noncontrast examination. No ascites. Pancreas: The pancreas appears atrophic but otherwise normal. Spleen: Normal noncontrast appearance of the spleen. Adrenals/Urinary Tract: No evidence of nephrolithiasis. Note is made of a hypoattenuating (12 Hounsfield unit) approximately 2.7 cm partially exophytic lesion arising from the posterosuperior aspect the right kidney (image 34, series 3), incompletely characterized though favored to represent a renal cyst. No urinary obstruction or perinephric stranding. Normal noncontrast appearance of the bilateral adrenal glands. The urinary bladder was not imaged. Stomach/Bowel: The anterior wall the stomach is well apposed against the ventral wall of the upper abdomen without interposed hepatic parenchyma or transverse colon. Enteric tube terminates within the mid body of the stomach. Scattered minimal colonic diverticulosis without evidence of superimposed acute diverticulitis. No pneumoperitoneum, pneumatosis or portal venous gas. Vascular/Lymphatic: Atherosclerotic plaque within a normal caliber abdominal aorta. Other: Note is made of retroperitoneal hematomas affecting the bilateral iliacus musculature (image 63, series 3), similar in hind site compared to lumbar spine CT 05/25/2019. Scattered subcutaneous emphysema and stranding about the lower lumbar spine operative site  including an approximately 12.6 x 6.3 x 2.9 cm mixed air and fluid collection about the midline of the low back again (axial image 38, series 3; sagittal image 77, series 7). Musculoskeletal: The patient has undergone T11-L4 paraspinal fusion without evidence of hardware failure or loosening though there is persistent widening involving the anterior aspect of the L1-L2 intervertebral disc space. Stable sequela of ankylosing spondylitis. Old left 7th posterolateral rib fracture. IMPRESSION: 1. Gastric anatomy amenable to potential percutaneous gastrostomy tube placement as indicated. 2. Retroperitoneal hematomas affecting the bilateral iliacus musculature, similar in hind site to lumbar spine CT performed 05/25/2019. Correlation with hematocrit levels is advised. 3. Interval T11-L4 paraspinal fusion without evidence of hardware failure or loosening, though note is made of an approximately 12.6 cm mixed air and fluid collection about the midline of the low back, presumably postoperative in etiology though underlying infection is not excluded. Clinical correlation is advised. 4.  Aortic Atherosclerosis (ICD10-I70.0). Electronically Signed: By: Simonne ComeJohn  Watts M.D. On: 06/02/2019 11:30    Scheduled Meds:  ascorbic acid  500 mg Per Tube Daily   bethanechol  10 mg Per Tube BID   carvedilol  6.25 mg Per Tube BID WC   chlorhexidine  15 mL Mouth Rinse BID   Chlorhexidine Gluconate Cloth  6 each Topical Daily   fluticasone  2 spray Each Nare Daily   fluticasone furoate-vilanterol  1 puff Inhalation Daily   insulin aspart  0-20 Units Subcutaneous Q4H   insulin aspart  4 Units Subcutaneous TID WC   insulin detemir  40 Units Subcutaneous BID   liver oil-zinc oxide   Topical QID   loratadine  10 mg Per Tube QPM   losartan  12.5 mg Per Tube Daily   mouth rinse  15 mL Mouth Rinse q12n4p   multivitamin with minerals  1 tablet Per Tube Daily   pantoprazole sodium  40 mg Per Tube Daily   simvastatin   20 mg Per Tube q morning - 10a   sodium chloride flush  10-40 mL Intracatheter Q12H   sodium chloride flush  3 mL Intravenous Q12H   sodium chloride HYPERTONIC  4 mL Nebulization BID   torsemide  20 mg Per Tube q1800   umeclidinium bromide  1 puff Inhalation Daily   Vilazodone HCl  40 mg Oral Daily    Continuous Infusions:  sodium chloride Stopped (05/25/19 1944)   feeding supplement (PIVOT 1.5 CAL) 1,000 mL (06/02/19 0901)   heparin 2,450 Units/hr (06/02/19 0856)     LOS: 27 days     Kayleen Memos, MD [facility] Pager [phone]  If 7PM-7AM, please contact night-coverage [email or web address] Password [email or web address] 06/02/2019, 1:26 PM

## 2019-06-02 NOTE — Progress Notes (Signed)
Stopped Heparin IV infusion per order.

## 2019-06-02 NOTE — Progress Notes (Signed)
Central tele called reporting patient had 7 beats of Vtach . The patient was sleeping soundly and asymptomatic. Previous DO note mentioned 30 beats of VT. Will continue to monitor patient.

## 2019-06-02 NOTE — Progress Notes (Addendum)
Progress Note  Patient Name: Cody Hale Date of Encounter: 06/02/2019  Primary Cardiologist: Prentice Docker, MD   Subjective   Remains in afib.  No further VT.    Inpatient Medications    Scheduled Meds: . ascorbic acid  500 mg Per Tube Daily  . bethanechol  10 mg Per Tube BID  . carvedilol  6.25 mg Per Tube BID WC  . chlorhexidine  15 mL Mouth Rinse BID  . Chlorhexidine Gluconate Cloth  6 each Topical Daily  . fluticasone  2 spray Each Nare Daily  . fluticasone furoate-vilanterol  1 puff Inhalation Daily  . insulin aspart  0-20 Units Subcutaneous Q4H  . insulin aspart  4 Units Subcutaneous TID WC  . insulin detemir  40 Units Subcutaneous BID  . liver oil-zinc oxide   Topical QID  . loratadine  10 mg Per Tube QPM  . losartan  12.5 mg Per Tube Daily  . mouth rinse  15 mL Mouth Rinse q12n4p  . multivitamin with minerals  1 tablet Per Tube Daily  . pantoprazole sodium  40 mg Per Tube Daily  . simvastatin  20 mg Per Tube q morning - 10a  . sodium chloride flush  10-40 mL Intracatheter Q12H  . sodium chloride flush  3 mL Intravenous Q12H  . sodium chloride HYPERTONIC  4 mL Nebulization BID  . torsemide  20 mg Per Tube q1800  . umeclidinium bromide  1 puff Inhalation Daily  . Vilazodone HCl  40 mg Oral Daily   Continuous Infusions: . sodium chloride Stopped (05/25/19 1944)  . feeding supplement (PIVOT 1.5 CAL) 60 mL/hr at 06/01/19 2200  . heparin 2,450 Units/hr (06/01/19 2143)   PRN Meds: acetaminophen, [DISCONTINUED] acetaminophen **OR** acetaminophen, albuterol, docusate, lidocaine (PF), ondansetron **OR** ondansetron (ZOFRAN) IV, oxyCODONE, polyethylene glycol, sodium chloride flush, sodium chloride flush   Vital Signs    Vitals:   06/01/19 2141 06/02/19 0007 06/02/19 0433 06/02/19 0440  BP: 129/75  (!) 123/52   Pulse: 75  77   Resp: 17  15   Temp: 98.7 F (37.1 C) 98.3 F (36.8 C) 98.7 F (37.1 C)   TempSrc: Oral Oral Oral   SpO2: 97%  99%   Weight:     114.4 kg  Height:        Intake/Output Summary (Last 24 hours) at 06/02/2019 0758 Last data filed at 06/02/2019 0743 Gross per 24 hour  Intake 2867.54 ml  Output 1050 ml  Net 1817.54 ml   Filed Weights   05/30/19 0500 05/31/19 0438 06/02/19 0440  Weight: 113.6 kg 115.8 kg 114.4 kg    Telemetry    Atrial fibrillation with occasional aberration - Personally Reviewed  ECG    No new EKG to review - Personally Reviewed  Physical Exam   Not done due to hx of recent COVID +  Labs    Chemistry Recent Labs  Lab 06/01/19 0050 06/01/19 0316 06/02/19 0643  NA 146* 146* 145  K 4.7 4.7 4.3  CL 105 105 104  CO2 35* 34* 32  GLUCOSE 179* 197* 196*  BUN 37* 36* 29*  CREATININE 0.57* 0.57* 0.60*  CALCIUM 8.5* 8.6* 8.4*  GFRNONAA >60 >60 >60  GFRAA >60 >60 >60  ANIONGAP 6 7 9      Hematology Recent Labs  Lab 05/30/19 0631 05/31/19 0500 06/01/19 0316  WBC 4.4 3.8* 4.3  RBC 2.83* 2.82* 2.91*  HGB 7.9* 7.8* 8.1*  HCT 27.0* 27.1* 27.9*  MCV 95.4 96.1 95.9  MCH 27.9 27.7 27.8  MCHC 29.3* 28.8* 29.0*  RDW 18.0* 18.1* 18.5*  PLT 173 197 224    Cardiac EnzymesNo results for input(s): TROPONINI in the last 168 hours. No results for input(s): TROPIPOC in the last 168 hours.   BNPNo results for input(s): BNP, PROBNP in the last 168 hours.   DDimer No results for input(s): DDIMER in the last 168 hours.   Radiology    No results found.  Cardiac Studies      1. Left ventricular ejection fraction, by visual estimation, is 40 to 45%. The left ventricle has mild to moderately decreased function. There is moderately increased left ventricular hypertrophy.  2. Global right ventricle has mildly reduced systolic function.The right ventricular size is mildly enlarged.  3. 65mm St Jude bioprosthetic mitral valve. Trace mitral valve regurgitation.     Mean gradient 9 mmHg at 87 bpm, stable from prior echo 05/17/18  4. The tricuspid valve is normal in structure. Tricuspid  valve regurgitation is trivial.  5. The aortic valve is tricuspid. Aortic valve regurgitation is trivial.  6. The pulmonic valve was not well visualized. Pulmonic valve regurgitation is trivial.  7. The atrial septum is grossly normal.  8. No vegetation seen  Patient Profile     68 y.o. male with a hx of chronic combined systolic and diastolic CHF with EF 26-71% by echo 2019 and recent TEE, NICM with cath in 2011 showing nonobstructive CAD, bioprosthetic MVR in 2014 in setting of endocarditis, chronic atrial fibrillation, OSA on PAP, HTN, HLD, IDDM  And seen by cards 10/30 for pre op eval for lumbar fx.  COVID + on admit with respiratory failure on vent and extubated on 11/18.  Xarelto held.  + bacteremia with TEE 11.12 with no veg.  Surgery done 11/17 for open reduction internal fix of spine.  He has been in afib with LBBB.  Cards asked to see again for long runs of VT.    Assessment & Plan    1.  NSVT -had several runs of NSVT -appreciate EP consult -BB increased and no further VT overnight -discussed with Dr. Rayann Heman - feel VT likely related to underlying acute illness in setting of LV dysfunction.  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  Will continue to follow with conservative Rx for now and consider Lexiscan myoview if his condition improves.   2.  Chronic atrial fibrillation -HR controlled -Xarelto on hold -continue IV Heparin  3.  Bacteremia -no veg on TEE 11/12 -per TRH  4.  Chronic combined systolic/diastolic CHF with EF 24-58% on TEE -continue Carvedilol, losartan and torsemide -creatinine stable at 0.6.  5.  Bioprosthetic MV in 2014 -no endocarditis on recent TEE'  6.  HTN -BP controlled        For questions or updates, please contact [place] Please consult [email or web address] for contact info under Cardiology/STEMI.      Signed, Fransico Him, MD  06/02/2019, 7:58 AM

## 2019-06-02 NOTE — Progress Notes (Signed)
ANTICOAGULATION CONSULT NOTE - Follow Up Consult  Pharmacy Consult for Heparin Indication: atrial fibrillation  Allergies  Allergen Reactions  . Lisinopril Cough  . Daptomycin Rash  . Metformin And Related Diarrhea  . Tape Rash and Other (See Comments)    Adhesive Tape-Burn skin.    Patient Measurements: Height: 5\' 9"  (175.3 cm) Weight: 252 lb 4.8 oz (114.4 kg) IBW/kg (Calculated) : 70.7 Heparin Dosing Weight: 96.6 kg  Vital Signs: Temp: 98.8 F (37.1 C) (11/25 1600) Temp Source: Oral (11/25 1600) BP: 132/66 (11/25 1600) Pulse Rate: 95 (11/25 1600)  Labs: Recent Labs    05/31/19 0500 06/01/19 0050 06/01/19 0316 06/01/19 0602 06/02/19 0543 06/02/19 0643 06/02/19 1324  HGB 7.8*  --  8.1*  --   --   --  8.5*  HCT 27.1*  --  27.9*  --   --   --  28.6*  PLT 197  --  224  --   --   --   --   HEPARINUNFRC 0.58  --   --  0.56 0.48  --   --   CREATININE 0.63 0.57* 0.57*  --   --  0.60*  --   TROPONINIHS  --  18* 18*  --   --   --   --     Estimated Creatinine Clearance: 110.3 mL/min (A) (by C-G formula based on SCr of 0.6 mg/dL (L)).   Assessment: 68 yr old male on heparin for afib (Xarelto PTA for Afib, LD 10/28 AM) - holding po until patient has no more procedures, possible PEG tube placement on 06/04/19.  Heparin level earlier today on heparin infusion at 2450 units/hr was 0.48 units/ml, which is within the desired goal range. Heparin infusion was stopped at ~2 PM today to recheck H/H after CT showed retroperitoneal hematomas affecting the bilateral iliacus musculature. H/H 8.5/28.6 this afternoon (stable), last platelet count (yesterday) 224. Per RN, no bleeding observed.  Pharmacy is consulted to restart heparin infusion, using lower goal range (heparin level 0.3-0.5 units/ml).   Goal of Therapy:  Heparin level 0.3-0.5 units/ml  Monitor platelets by anticoagulation protocol: Yes   Plan:  Restart heparin at 2450 units/hr with new goal range of 0.3-0.5  units/ml Check 6-hr heparin level Monitor daily heparin level, CBC Monitor for signs/symptoms of bleeding  Gillermina Hu, PharmD, BCPS, [facility] Clinical Pharmacist 06/02/2019 5:35 PM

## 2019-06-03 ENCOUNTER — Inpatient Hospital Stay (HOSPITAL_COMMUNITY): Payer: Medicare Other

## 2019-06-03 DIAGNOSIS — R7881 Bacteremia: Secondary | ICD-10-CM | POA: Diagnosis not present

## 2019-06-03 LAB — HEPARIN LEVEL (UNFRACTIONATED)
Heparin Unfractionated: 0.47 IU/mL (ref 0.30–0.70)
Heparin Unfractionated: 0.5 IU/mL (ref 0.30–0.70)
Heparin Unfractionated: 0.69 IU/mL (ref 0.30–0.70)

## 2019-06-03 LAB — CULTURE, BLOOD (ROUTINE X 2)
Culture: NO GROWTH
Culture: NO GROWTH
Special Requests: ADEQUATE
Special Requests: ADEQUATE

## 2019-06-03 LAB — CBC
HCT: 27.3 % — ABNORMAL LOW (ref 39.0–52.0)
Hemoglobin: 8.1 g/dL — ABNORMAL LOW (ref 13.0–17.0)
MCH: 28.2 pg (ref 26.0–34.0)
MCHC: 29.7 g/dL — ABNORMAL LOW (ref 30.0–36.0)
MCV: 95.1 fL (ref 80.0–100.0)
Platelets: 270 10*3/uL (ref 150–400)
RBC: 2.87 MIL/uL — ABNORMAL LOW (ref 4.22–5.81)
RDW: 19.2 % — ABNORMAL HIGH (ref 11.5–15.5)
WBC: 6.9 10*3/uL (ref 4.0–10.5)
nRBC: 0.6 % — ABNORMAL HIGH (ref 0.0–0.2)

## 2019-06-03 LAB — GLUCOSE, CAPILLARY
Glucose-Capillary: 174 mg/dL — ABNORMAL HIGH (ref 70–99)
Glucose-Capillary: 167 mg/dL — ABNORMAL HIGH (ref 70–99)
Glucose-Capillary: 179 mg/dL — ABNORMAL HIGH (ref 70–99)
Glucose-Capillary: 153 mg/dL — ABNORMAL HIGH (ref 70–99)
Glucose-Capillary: 174 mg/dL — ABNORMAL HIGH (ref 70–99)

## 2019-06-03 LAB — HEMOGLOBIN AND HEMATOCRIT, BLOOD
HCT: 25.4 % — ABNORMAL LOW (ref 39.0–52.0)
Hemoglobin: 7.4 g/dL — ABNORMAL LOW (ref 13.0–17.0)

## 2019-06-03 LAB — BRAIN NATRIURETIC PEPTIDE: B Natriuretic Peptide: 368.9 pg/mL — ABNORMAL HIGH (ref 0.0–100.0)

## 2019-06-03 LAB — PROCALCITONIN: Procalcitonin: 0.24 ng/mL

## 2019-06-03 MED ORDER — CARVEDILOL 3.125 MG PO TABS
3.1250 mg | ORAL_TABLET | Freq: Two times a day (BID) | ORAL | Status: DC
  Administered 2019-06-04 (×2): 3.125 mg
  Filled 2019-06-03 (×2): qty 1

## 2019-06-03 MED ORDER — LEVALBUTEROL HCL 0.63 MG/3ML IN NEBU
0.6300 mg | INHALATION_SOLUTION | Freq: Three times a day (TID) | RESPIRATORY_TRACT | Status: DC
  Administered 2019-06-04 (×2): 0.63 mg via RESPIRATORY_TRACT
  Filled 2019-06-03 (×3): qty 3

## 2019-06-03 MED ORDER — SODIUM CHLORIDE 0.9 % IV SOLN
2.0000 g | Freq: Three times a day (TID) | INTRAVENOUS | Status: DC
  Administered 2019-06-03 – 2019-06-08 (×15): 2 g via INTRAVENOUS
  Filled 2019-06-03 (×17): qty 2

## 2019-06-03 MED ORDER — VANCOMYCIN HCL 10 G IV SOLR
2000.0000 mg | INTRAVENOUS | Status: DC
  Administered 2019-06-03 – 2019-06-05 (×3): 2000 mg via INTRAVENOUS
  Filled 2019-06-03 (×4): qty 2000

## 2019-06-03 MED ORDER — LEVALBUTEROL HCL 0.63 MG/3ML IN NEBU
0.6300 mg | INHALATION_SOLUTION | Freq: Three times a day (TID) | RESPIRATORY_TRACT | Status: DC
  Administered 2019-06-03: 0.63 mg via RESPIRATORY_TRACT
  Filled 2019-06-03 (×2): qty 3

## 2019-06-03 MED ORDER — LOSARTAN POTASSIUM 25 MG PO TABS
12.5000 mg | ORAL_TABLET | Freq: Every day | ORAL | Status: DC
  Filled 2019-06-03: qty 0.5

## 2019-06-03 NOTE — Progress Notes (Signed)
      Dr Theodosia Blender rounding note reviewed, we have been following for NSVT.  Has been on coreg 6.25mg  bid with recent dose increase on 11/25 to further suppress NSVT. Tele review shows rate controlled afib, rare PVCs. No significant NSVT or VT. Continue current therapy, keep K at 4 and Mg at 2, no additional recs today, call with questions.      Merrily Pew, MD  06/03/2019, 6:58 AM

## 2019-06-03 NOTE — Progress Notes (Signed)
PROGRESS NOTE  Cody Hale NVB:[id number] DOB: Nov 13, 1950 DOA: 05/06/2019 PCP: [name], MD  HPI/Recap of past 24 hours: Cody Hale is a 67 y.o. 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  Patient fell onto his buttock.  Started hurting and was brought to the ER.  Denies any incontinence of urine or bowel.  Patient's pain has been significant.  ED Course: In the ER at [facility] MRI showed unstable lumbar fracture and patient was transferred to [facility].  Patient had Covid test done which was positive.  Patient otherwise is denying any shortness of breath has been a some productive cough chest x-ray was showing cardiomegaly and nothing acute.  Other labs show BNP of 326 LDH 331 CRP 14.7 WBC 11.9 hemoglobin 13.8 platelets 104 creatinine 1.3.  Neurosurgery has been consulted patient is being admitted for further management of lumbar fracture with COVID-19 being positive.     Brief interval history:Patient with a history of atrial fibrillation on chronic anticoagulation with Xarelto, CAD, history of endocarditis, left bundle branch block, history of mitral valve replacement, chronic systolic heart failure, SVT, COPD, chronic venous insufficiency, and hypertension who presented on 10/29 after a fall.  He was found to have an unstable fracture of his lumbar spine but was also found to be positive for (asymptomatic) COVID-19 infection.   While waiting for surgical clearance (increased risk of complications in the setting of surgery with COVID infection) and developed GBS bacteremia, now off antibiotics.  He had multilevel spinal fusion on 11/17.  He was difficult to ventilate in the OR and so kept intubated overnight.  Patient was previously on the hospitalist service.   Currently on RA.  He  is quite deconditioned due to prolonged hospitalization.  He will need aggressive pulmonary toilet.  PT is seeing him.  Neurosurgery is following and he has a drain in place, but he is stable from this standpoint.  He is likely to need rehab - CIR vs. SNF, likely too early to tell which.  TRH will resume care of the patient on 11/21.  11/21: Persistent nonproductive cough and back pain.  Breathing is improved.  No chest pain.  11/23: Right hip pain.  Wet cough noted on exam.  Ongoing suctioning by bedside RN.   11/24: Patient was seen and examined at his bedside this morning.  Cough is improved.  Bedside RN reports 30 beats of V. tach overnight, asymptomatic.  Twelve-lead EKG showed A. fib with frequent PVCs.  Last TEE done on 05/20/2019 showed LVEF 40 to 45%.  Cardiology consulted.   11/25: seen and examined at his bedside this morning More alert and interactive. Discussed with IR for possible peg tube placement on 06/04/19. CT abd pelvis done to look at the structure showed incidentally found retroperitoneal hematomas affecting b/l iliacus musculature similar in hind site to lumbar spine CT performed 05/25/2019. Correlation with hematocrit levels is advised. Will hold hep drip until repeat H&H results.  06/03/19: Patient was seen and examined at bedside this morning.  Productive cough noted on exam.  He has no new complaints.  Plan for PEG tube placement tomorrow 06/04/2019 by interventional radiology.    Assessment/Plan: Active Problems:   OSA (obstructive sleep apnea)   Type II diabetes mellitus with manifestations (HCC)   Chronic systolic CHF (congestive heart failure) (HCC)  Hyperlipidemia with target LDL less than 70   Chronic atrial fibrillation   Chronic respiratory failure with hypoxia (HCC)   Unstable burst fracture of unspecified lumbar vertebra, initial encounter for closed fracture (HCC)   Closed unstable burst fracture of lumbar vertebra (HCC)   Closed unstable burst  fracture of first lumbar vertebra (HCC)   Pressure injury of skin   Bacteremia   Hyponatremia   NSVT (nonsustained ventricular tachycardia) (HCC)  POD #10 post T11-L4 fusion for L1 Chance fracture. Seen by neurosurgery, no new recommendations, signed off on 05/31/2019. Pain management and bowel regimen as needed.  Retroperitoneal Hematomas affecting iliacus musculature similar in hind site to lumbar spine CT performed 05/25/2019. Correlation with hematocrit levels is advised. Will hold hep drip until repeat H&H results. Restarted heparin drip at lower dose on 06/02/2019>> trend up of hemoglobin from 8.1 to 8.5>> chronic a-fib with elevated CHADSVASC, recent covid infection. High risk for thromboembolism. C/ to monitor H&H  Dysphagia, coretrack in place Failed swallow evaluation by speech therapist consistently IR consulted. Plan for peg tube placement on 06/04/19. NPO after midnight and off blood thinners for at least 3 hrs RT suctioning BID  Acute hypoxic respiratory failure possible silent aspiration O2 sat drop this am with audible rhonchorous sounds and wet cough Will RT suction BID C/w aspiration precautions>> head of bed elevated >30 degrees during feeding Obtain cxr at bedside Obtain procalcitonin Maintain O2 sat>92%  Improving acute hypoxic hypercarbic respiratory failure requiring invasive mechanical ventilation likely secondary to COVID-19 pneumonia versus acute COPD exacerbation Not On oxygen supplementation at baseline Extubated on 05/26/2019 Currently on nasal cannula 3 L with O2 saturation in the upper 90s Continue pulmonary toilet; add hypersaline nebs BID Continue to maintain O2 saturation greater than 92% RT suctioning twice daily  Nonsustained V. Tach 30 runs of V. tach overnight, asymptomatic Twelve-lead EKG shows A. fib with frequent PVCs TEE done on 05/20/2019 showed LVEF 40 to 45% Cardiology following. Beta-blocker dose adjusted by cardiology with  improvement Continue to monitor on telemetry  Acute urinary retention Indwelling Foley catheter placed on 05/29/2019, removed on 06/01/2019. Bethanechol started on 05/30/2019, continue Continue bethanechol, good urine output 2.3 L recorded in the last 24 hours. Continue to monitor urine output  Chronic A. Fib Rate is controlled Continue Coreg for rate control DC hep drip for now  Streptococcous Group G bacteremia, poa Blood cx x 2 positive on 05/10/19. Repeat blood cx x2 done on 05/11/19 staph coag neg x1 (likely contaminant) and other bottle negative growth final. Completed course of treatment Blood cultures drawn on 05/29/2019 due to concern for fever negative times less than 24-hour We will continue to follow cultures  Treated COVID-19 viral pneumonia  Treated with 5 days of remdesivir Continue bronchodilators and Lasix via NG tube Continue to maintain O2 saturation greater than 94%  COPD exacerbation Continue bronchodialators Independently viewed chest x-ray which shows mild bibasilar atelectasis. Rest of management as stated above  Chronic systolic CHF Last 2D echo showed LVEF 40 to 45% on 05/20/2019. Continue strict I's and O's and daily weight Continue current medication: On Coreg 3.125 mg twice daily, losartan 12.5 mg daily,Torsemide 20 mg daily  GERD Continue PPI  Hyperlipidemia Continue statin  Physical debility/ambulatory dysfunction PT assessment recommended to continue improvement toward CIR. OT assessed and recommended SNF with 24-hour supervision/assistance and 3 in 1 bedside commode. Continue PT OT with assistance and fall precautions CSW consulted to assist with placement   Code Status: Full code  Family  Communication: None at bedside.  Disposition Plan: SNF in the next 48 to 72 hours.  Consultants:  Neurosurgery, signed off on 05/31/2019.  PCCM  Cardiology consulted on 06/01/2019.  Procedures: 11/17: PROCEDURE: 2. Open reduction,  internal fixation L1-2 Chance fracture 3. Posterior segmental instrumented pedicle screw stabilization, T11-L4 4. Posterolateral arthrodesis, T11-L4 Use of non-structural morcellized bone allograft - DBX 05/25/2019 intubation in OR per anesthesia>>extubated 11/18 Antimicrobials: Penicillin G 11/3-11/17  Significant Hospital Events   10/29 admitted for back pain  10/29 positive Covid test-asymptomatic GBS bacteremia, TEE negative for endocarditis.  Completed antibiotics on 1116 Mild exacerbation of COPD requiring diuresis 11/12 Multilevel spinal stabilization 11/17.   DVT prophylaxis: SCds  Objective: Vitals:   06/03/19 0347 06/03/19 0802 06/03/19 0843 06/03/19 1300  BP:  (!) 157/61  136/71  Pulse:  69  (!) 40  Resp:  20  18  Temp:  98.3 F (36.8 C)  98.1 F (36.7 C)  TempSrc:  Oral    SpO2:  93% 96% 92%  Weight: 113.7 kg     Height:        Intake/Output Summary (Last 24 hours) at 06/03/2019 1441 Last data filed at 06/03/2019 0800 Gross per 24 hour  Intake 1057.14 ml  Output 1850 ml  Net -792.86 ml   Filed Weights   05/31/19 0438 06/02/19 0440 06/03/19 0347  Weight: 115.8 kg 114.4 kg 113.7 kg    Exam:   General: 68 y.o. year-old male well-developed well-nourished. Coretrack in place.  Alert and interactive. Audible rhonchorous sound and wet cough. Cardiovascular: Irregular rate and rhythm no rubs or gallops.  Respiratory: Mild rales at bases rhonchorous sounds noted anteriorly. Abdomen: Obese nontender nondistended normal bowel sounds present.   Musculoskeletal: Trace edema in lower extremities bilaterally. Psychiatry: Mood is appropriate for condition and setting.  Data Reviewed: CBC: Recent Labs  Lab 05/29/19 1511 05/30/19 0631 05/31/19 0500 06/01/19 0316 06/02/19 1324 06/03/19 0345  WBC 6.7 4.4 3.8* 4.3  --  6.9  NEUTROABS 5.6  --   --   --   --   --   HGB 7.9* 7.9* 7.8* 8.1* 8.5* 8.1*  HCT 26.7* 27.0* 27.1* 27.9* 28.6* 27.3*  MCV 93.7 95.4 96.1  95.9  --  95.1  PLT 167 173 197 224  --  387   Basic Metabolic Panel: Recent Labs  Lab 05/28/19 0511  05/30/19 0631 05/31/19 0500 06/01/19 0050 06/01/19 0316 06/02/19 0643  NA 135   < > 142 144 146* 146* 145  K 4.3   < > 4.5 4.8 4.7 4.7 4.3  CL 98   < > 102 104 105 105 104  CO2 28   < > 34* 35* 35* 34* 32  GLUCOSE 244*   < > 208* 193* 179* 197* 196*  BUN 39*   < > 42* 43* 37* 36* 29*  CREATININE 0.70   < > 0.61 0.63 0.57* 0.57* 0.60*  CALCIUM 7.8*   < > 8.1* 8.3* 8.5* 8.6* 8.4*  MG 2.5*  --   --   --  2.5*  --  2.3  PHOS 2.8  --   --   --  3.3  --   --    < > = values in this interval not displayed.   GFR: Estimated Creatinine Clearance: 109.9 mL/min (A) (by C-G formula based on SCr of 0.6 mg/dL (L)). Liver Function Tests: No results for input(s): AST, ALT, ALKPHOS, BILITOT, PROT, ALBUMIN in the last 168 hours. No results for  input(s): LIPASE, AMYLASE in the last 168 hours. No results for input(s): AMMONIA in the last 168 hours. Coagulation Profile: No results for input(s): INR, PROTIME in the last 168 hours. Cardiac Enzymes: No results for input(s): CKTOTAL, CKMB, CKMBINDEX, TROPONINI in the last 168 hours. BNP (last 3 results) No results for input(s): PROBNP in the last 8760 hours. HbA1C: No results for input(s): HGBA1C in the last 72 hours. CBG: Recent Labs  Lab 06/02/19 1950 06/02/19 2354 06/03/19 0335 06/03/19 0804 06/03/19 1301  GLUCAP 170* 185* 174* 167* 179*   Lipid Profile: No results for input(s): CHOL, HDL, LDLCALC, TRIG, CHOLHDL, LDLDIRECT in the last 72 hours. Thyroid Function Tests: No results for input(s): TSH, T4TOTAL, FREET4, T3FREE, THYROIDAB in the last 72 hours. Anemia Panel: No results for input(s): VITAMINB12, FOLATE, FERRITIN, TIBC, IRON, RETICCTPCT in the last 72 hours. Urine analysis:    Component Value Date/Time   COLORURINE YELLOW 05/19/2019 1355   APPEARANCEUR CLEAR 05/19/2019 1355   APPEARANCEUR Clear 07/23/2017   LABSPEC 1.013  05/19/2019 1355   PHURINE 5.0 05/19/2019 1355   GLUCOSEU NEGATIVE 05/19/2019 1355   GLUCOSEU 500 (A) 07/14/2017 1053   HGBUR SMALL (A) 05/19/2019 1355   BILIRUBINUR NEGATIVE 05/19/2019 1355   BILIRUBINUR Negative 07/23/2017   KETONESUR NEGATIVE 05/19/2019 1355   PROTEINUR NEGATIVE 05/19/2019 1355   UROBILINOGEN 0.2 07/14/2017 1053   NITRITE NEGATIVE 05/19/2019 1355   LEUKOCYTESUR NEGATIVE 05/19/2019 1355   Sepsis Labs: @LABRCNTIP (procalcitonin:4,lacticidven:4)  ) Recent Results (from the past 240 hour(s))  Surgical pcr screen     Status: None   Collection Time: 05/24/19  7:39 PM   Specimen: Nasal Mucosa; Nasal Swab  Result Value Ref Range Status   MRSA, PCR NEGATIVE NEGATIVE Final   Staphylococcus aureus NEGATIVE NEGATIVE Final    Comment: (NOTE) The Xpert SA Assay (FDA approved for NASAL specimens in patients 68 years of age and older), is one component of a comprehensive surveillance program. It is not intended to diagnose infection nor to guide or monitor treatment. Performed at [facility], [address] [place], [place] [address]   Culture, blood (routine x 2)     Status: None   Collection Time: 05/29/19  4:14 PM   Specimen: BLOOD LEFT HAND  Result Value Ref Range Status   Specimen Description BLOOD LEFT HAND  Final   Special Requests AEROBIC BOTTLE ONLY Blood Culture adequate volume  Final   Culture   Final    NO GROWTH 5 DAYS Performed at [facility], [address] [place], [place] [address]    Report Status 06/03/2019 FINAL  Final  Culture, blood (routine x 2)     Status: None   Collection Time: 05/29/19  4:14 PM   Specimen: BLOOD LEFT HAND  Result Value Ref Range Status   Specimen Description BLOOD LEFT HAND  Final   Special Requests AEROBIC BOTTLE ONLY Blood Culture adequate volume  Final   Culture   Final    NO GROWTH 5 DAYS Performed at [facility], [address] [place], [place] [address]    Report Status 06/03/2019 FINAL   Final      Studies: No results found.  Scheduled Meds:  ascorbic acid  500 mg Per Tube Daily   bethanechol  10 mg Per Tube BID   carvedilol  6.25 mg Per Tube BID WC   chlorhexidine  15 mL Mouth Rinse BID   Chlorhexidine Gluconate Cloth  6 each Topical Daily  fluticasone  2 spray Each Nare Daily   fluticasone furoate-vilanterol  1 puff Inhalation Daily   insulin aspart  0-20 Units Subcutaneous Q4H   insulin aspart  4 Units Subcutaneous TID WC   insulin detemir  40 Units Subcutaneous BID   liver oil-zinc oxide   Topical QID   loratadine  10 mg Per Tube QPM   losartan  12.5 mg Per Tube Daily   mouth rinse  15 mL Mouth Rinse q12n4p   multivitamin with minerals  1 tablet Per Tube Daily   pantoprazole sodium  40 mg Per Tube Daily   simvastatin  20 mg Per Tube q morning - 10a   sodium chloride flush  10-40 mL Intracatheter Q12H   sodium chloride flush  3 mL Intravenous Q12H   sodium chloride HYPERTONIC  4 mL Nebulization BID   torsemide  20 mg Per Tube q1800   umeclidinium bromide  1 puff Inhalation Daily   Vilazodone HCl  40 mg Oral Daily    Continuous Infusions:  sodium chloride Stopped (05/25/19 1944)   [START ON 06/04/2019]  ceFAZolin (ANCEF) IV     feeding supplement (PIVOT 1.5 CAL) 60 mL/hr at 06/02/19 1808   heparin 2,400 Units/hr (06/03/19 1315)     LOS: 28 days     Darlin Droparole N Clarise Chacko, MD [facility] Pager [phone]  If 7PM-7AM, please contact night-coverage [email or web address] Password [email or web address] 06/03/2019, 2:41 PM

## 2019-06-03 NOTE — Progress Notes (Signed)
ANTICOAGULATION CONSULT NOTE - Follow Up Consult  Pharmacy Consult for heparin Indication: atrial fibrillation  Labs: Recent Labs    05/31/19 0500 06/01/19 0050 06/01/19 0316 06/01/19 0602 06/02/19 0543 06/02/19 0643 06/02/19 1324 06/03/19 0345  HGB 7.8*  --  8.1*  --   --   --  8.5* 8.1*  HCT 27.1*  --  27.9*  --   --   --  28.6* 27.3*  PLT 197  --  224  --   --   --   --  270  HEPARINUNFRC 0.58  --   --  0.56 0.48  --   --  0.47  CREATININE 0.63 0.57* 0.57*  --   --  0.60*  --   --   TROPONINIHS  --  18* 18*  --   --   --   --   --     Assessment/Plan:  68yo male therapeutic on heparin after resumed at previous rate. Will continue gtt at current rate and confirm stable with additional level.   Wynona Neat, PharmD, BCPS  06/03/2019,4:18 AM

## 2019-06-03 NOTE — Progress Notes (Signed)
Physical Therapy Treatment Patient Details Name: Cody Hale MRN: [id number] DOB: April 18, 1951 Today's Date: 06/03/2019    History of Present Illness Pt is a 68 yo male presented 05/06/19 to ED after fall  (he tripped over some blankets), onto his buttocks with severe back pain;  CT lumbar spine showing unstable fracture L1-2 and likely ankylosing spondylitis; tested +COVID and developed fever and required TEE to r/o endocarditis prior to surgery;  11/17 underwent open reduction internal fixation of L1-L2 Chance fracture with multilevel spine stabilization from T11-L4. Remained intubated until 07/25/18 PMHx: Afib, MVR, SVT, anxiety, CAD, COPD, CHF, HTN,     PT Comments    Pt shouting out of the room on arrival.  He calmed down once he was engaged.  Emphasis on rolling and transitioning to EOB, sitting EOB while donning the TLSO.  Finally, stood pt to see how much w/bearing pt could attain then completed 3 squat/scoots to [facility] before returning to supine.    Follow Up Recommendations  SNF(if progressing, consider CIR)     Equipment Recommendations  Other (comment)(TBD)    Recommendations for Other Services       Precautions / Restrictions Precautions Precautions: Back;Fall Precaution Booklet Issued: No Precaution Comments: alert, yelling out and singing loudly.  pt quietened down when therapies entered. Required Braces or Orthoses: Spinal Brace Spinal Brace: Thoracolumbosacral orthotic;Applied in sitting position(*order is to don in sitting) Restrictions Weight Bearing Restrictions: No    Mobility  Bed Mobility Overal bed mobility: Needs Assistance Bed Mobility: Rolling;Sidelying to Sit;Sit to Supine Rolling: Max assist;+2 for physical assistance Sidelying to sit: +2 for physical assistance;+2 for safety/equipment;Mod assist;Max assist     Sit to sidelying: +2 for physical assistance;+2 for safety/equipment;Mod assist General bed mobility comments: cued for technique,  assisted  role and hold at right side for dressiing change.  Truncal assist for transition up vis R UE..  Transfers Overall transfer level: Needs assistance   Transfers: Sit to/from Stand;Lateral/Scoot Transfers Sit to Stand: Max assist;Total assist;+2 safety/equipment        Lateral/Scoot Transfers: Max assist;+2 physical assistance(x3 up toward [facility]) General transfer comment: required use of the bed pad and knee blocking to stand and scoot toward HOB.  Ambulation/Gait             General Gait Details: unable   Stairs             Wheelchair Mobility    Modified Rankin (Stroke Patients Only)       Balance Overall balance assessment: Needs assistance Sitting-balance support: Bilateral upper extremity supported;Feet supported Sitting balance-Leahy Scale: Poor Sitting balance - Comments: Initially, pt with noticeable list posteriorly and need for support while brace donned in sitting.  Finally pt brought to EOB and propped on elbows on tray table.  Pt able to maintain sitting without assist only guard.  Total time at EOB 8-10 min.                                    Cognition Arousal/Alertness: Awake/alert Behavior During Therapy: Flat affect Overall Cognitive Status: Impaired/Different from baseline Area of Impairment: Attention;Following commands;Problem solving                   Current Attention Level: Sustained   Following Commands: Follows one step commands with increased time     Problem Solving: Slow processing;Decreased initiation;Requires verbal cues;Requires tactile cues;Difficulty sequencing  Exercises General Exercises - Lower Extremity Heel Slides: AAROM;5 reps;Left(resisted extension RLE stronger than LLE) Other Exercises Other Exercises: AAROM bil LE in hip/knee flex/ext and bil hip add/IR with stretch for better positioning at EOB    General Comments General comments (skin integrity, edema, etc.): HR up to the 120's  otherwise VSS      Pertinent Vitals/Pain Faces Pain Scale: Hurts even more Pain Location: back Pain Descriptors / Indicators: Discomfort;Grimacing;Moaning Pain Intervention(s): Monitored during session;Repositioned;Limited activity within patient's tolerance    Home Living                      Prior Function            PT Goals (current goals can now be found in the care plan section) Acute Rehab PT Goals Patient Stated Goal: to get comfortable PT Goal Formulation: With patient Time For Goal Achievement: 06/09/19 Potential to Achieve Goals: Fair Progress towards PT goals: Progressing toward goals(slower than hoped)    Frequency    Min 5X/week      PT Plan Current plan remains appropriate    Co-evaluation              AM-PAC PT "6 Clicks" Mobility   Outcome Measure  Help needed turning from your back to your side while in a flat bed without using bedrails?: A Lot Help needed moving from lying on your back to sitting on the side of a flat bed without using bedrails?: A Lot Help needed moving to and from a bed to a chair (including a wheelchair)?: Total Help needed standing up from a chair using your arms (e.g., wheelchair or bedside chair)?: Total Help needed to walk in hospital room?: Total Help needed climbing 3-5 steps with a railing? : Total 6 Click Score: 8    End of Session Equipment Utilized During Treatment: Back brace Activity Tolerance: Treatment limited secondary to medical complications (Comment)(incr pain LLE) Patient left: in bed;with call bell/phone within reach;with SCD's reapplied Nurse Communication: Mobility status;Need for lift equipment PT Visit Diagnosis: Other abnormalities of gait and mobility (R26.89);Muscle weakness (generalized) (M62.81);Pain;History of falling (Z91.81) Pain - Right/Left: (bil L>R) Pain - part of body: Hip;Leg(legs, knees, back)     Time: 1000-1028 PT Time Calculation (min) (ACUTE ONLY): 28  min  Charges:  $Therapeutic Activity: 23-37 mins                     06/03/2019  Donnella Sham, PT Acute Rehabilitation Services [phone]  (pager) [phone]  (office)   Tessie Fass Deserea Bordley 06/03/2019, 12:09 PM

## 2019-06-03 NOTE — Progress Notes (Signed)
Pharmacy Antibiotic Note  Devean Skoczylas is a 68 y.o. male admitted on 05/06/2019 for lumbar fracture. Tm 102 tonight WBC wnl broaden antibiotics and recheck cultures.  Pharmacy has been consulted for Cefepime and vancomycin  dosing.  Plan: Cefepime 2gm IV q8h Vancomycin 2gm IV q24h Cr1, est AUC 487   Height: 5\' 9"  (962.8 cm) Weight: 250 lb 10.6 oz (113.7 kg) IBW/kg (Calculated) : 70.7  Temp (24hrs), Avg:99.1 F (37.3 C), Min:98.1 F (36.7 C), Max:102 F (38.9 C)  Recent Labs  Lab 05/29/19 1511 05/30/19 0631 05/31/19 0500 06/01/19 0050 06/01/19 0316 06/02/19 0643 06/03/19 0345  WBC 6.7 4.4 3.8*  --  4.3  --  6.9  CREATININE  --  0.61 0.63 0.57* 0.57* 0.60*  --     Estimated Creatinine Clearance: 109.9 mL/min (A) (by C-G formula based on SCr of 0.6 mg/dL (L)).    Allergies  Allergen Reactions  . Lisinopril Cough  . Daptomycin Rash  . Metformin And Related Diarrhea  . Tape Rash and Other (See Comments)    Adhesive Tape-Burn skin.    Antimicrobials this admission:  CTX 11/3>>11/4 Pen G 11/4 >> 11/16 Remdesivir 10/30 >> 11/3  Dose adjustments this admission:   Microbiology results:  Bonnita Nasuti Pharm.D. CPP, BCPS Clinical Pharmacist [phone] 06/03/2019 9:30 PM

## 2019-06-03 NOTE — Progress Notes (Signed)
ANTICOAGULATION CONSULT NOTE - Follow Up Consult  Pharmacy Consult for Heparin Indication: atrial fibrillation  Allergies  Allergen Reactions  . Lisinopril Cough  . Daptomycin Rash  . Metformin And Related Diarrhea  . Tape Rash and Other (See Comments)    Adhesive Tape-Burn skin.    Patient Measurements: Height: 5\' 9"  (175.3 cm) Weight: 250 lb 10.6 oz (113.7 kg) IBW/kg (Calculated) : 70.7 Heparin Dosing Weight: 96.6 kg  Vital Signs: Temp: 98.3 F (36.8 C) (11/26 0802) Temp Source: Oral (11/26 0802) BP: 157/61 (11/26 0802) Pulse Rate: 69 (11/26 0802)  Labs: Recent Labs    06/01/19 0050  06/01/19 0316  06/02/19 0543 06/02/19 0643 06/02/19 1324 06/03/19 0345 06/03/19 1106  HGB  --    < > 8.1*  --   --   --  8.5* 8.1*  --   HCT  --   --  27.9*  --   --   --  28.6* 27.3*  --   PLT  --   --  224  --   --   --   --  270  --   HEPARINUNFRC  --   --   --    < > 0.48  --   --  0.47 0.50  CREATININE 0.57*  --  0.57*  --   --  0.60*  --   --   --   TROPONINIHS 18*  --  18*  --   --   --   --   --   --    < > = values in this interval not displayed.    Estimated Creatinine Clearance: 109.9 mL/min (A) (by C-G formula based on SCr of 0.6 mg/dL (L)).   Assessment: 68 yr old male on heparin for afib (Xarelto PTA for Afib, LD 10/28 AM) - holding po until patient has no more procedures, possible PEG tube placement on 06/04/19.  Heparin level earlier today on heparin infusion at 2450 units/hr was 0.48 units/ml, which is within the desired goal range. Heparin infusion was stopped at ~2 PM today to recheck H/H after CT showed retroperitoneal hematomas affecting the bilateral iliacus musculature. H/H 8.5/28.6 this afternoon (stable), last platelet count (yesterday) 224. Per RN, no bleeding observed.  Pharmacy is consulted to restart heparin infusion, using lower goal range (heparin level 0.3-0.5 units/ml).  Heparin level 0.5 (at high end of goal range)  Goal of Therapy:  Heparin  level 0.3-0.5 units/ml  Monitor platelets by anticoagulation protocol: Yes   Plan:  Decrease heparin to 2400 units/hr (goal range of 0.3-0.5 units/ml) Check 6 - 8 hr heparin level Monitor daily heparin level, CBC Monitor for signs/symptoms of bleeding  Alanda Slim, PharmD, [facility] Clinical Pharmacist Please see AMION for all Pharmacists' Contact Phone Numbers 06/03/2019, 12:26 PM

## 2019-06-03 NOTE — Progress Notes (Signed)
ANTICOAGULATION CONSULT NOTE - Follow Up Consult  Pharmacy Consult for Heparin Indication: atrial fibrillation  Allergies  Allergen Reactions  . Lisinopril Cough  . Daptomycin Rash  . Metformin And Related Diarrhea  . Tape Rash and Other (See Comments)    Adhesive Tape-Burn skin.    Patient Measurements: Height: 5\' 9"  (175.3 cm) Weight: 250 lb 10.6 oz (113.7 kg) IBW/kg (Calculated) : 70.7 Heparin Dosing Weight: 96.6 kg  Vital Signs: Temp: 102 F (38.9 C) (11/26 2011) Temp Source: Oral (11/26 2011) BP: 116/66 (11/26 2011) Pulse Rate: 116 (11/26 2011)  Labs: Recent Labs    06/01/19 0050  06/01/19 0316  06/02/19 0643 06/02/19 1324 06/03/19 0345 06/03/19 1106 06/03/19 2005  HGB  --    < > 8.1*  --   --  8.5* 8.1*  --   --   HCT  --   --  27.9*  --   --  28.6* 27.3*  --   --   PLT  --   --  224  --   --   --  270  --   --   HEPARINUNFRC  --   --   --    < >  --   --  0.47 0.50 0.69  CREATININE 0.57*  --  0.57*  --  0.60*  --   --   --   --   TROPONINIHS 18*  --  18*  --   --   --   --   --   --    < > = values in this interval not displayed.    Estimated Creatinine Clearance: 109.9 mL/min (A) (by C-G formula based on SCr of 0.6 mg/dL (L)).   Assessment: 68 yr old male on heparin for afib (Xarelto PTA for Afib, LD 10/28 AM) - holding po until patient has no more procedures, possible PEG tube placement on 06/04/19.  Heparin level earlier today on heparin infusion at 2450 units/hr was 0.48 units/ml, which is within the desired goal range. Heparin infusion was stopped at ~2 PM today to recheck H/H after CT showed retroperitoneal hematomas affecting the bilateral iliacus musculature. H/H 8.5/28.6 this afternoon (stable), last platelet count (yesterday) 224. Per RN, no bleeding observed.  Pharmacy is consulted to restart heparin infusion, using lower goal range (heparin level 0.3-0.5 units/ml).  Heparin level tonight elevated 0.69   - repeat h/h ordered not resulted  yet  Goal of Therapy:  Heparin level 0.3-0.5 units/ml  Monitor platelets by anticoagulation protocol: Yes   Plan:  Decrease heparin to 2250 units/hr  Monitor daily heparin level, CBC Monitor for signs/symptoms of bleeding  Bonnita Nasuti Pharm.D. CPP, BCPS Clinical Pharmacist [phone] 06/03/2019 9:17 PM

## 2019-06-04 ENCOUNTER — Encounter (HOSPITAL_COMMUNITY): Payer: Self-pay | Admitting: Interventional Radiology

## 2019-06-04 ENCOUNTER — Inpatient Hospital Stay (HOSPITAL_COMMUNITY): Payer: Medicare Other

## 2019-06-04 DIAGNOSIS — R7881 Bacteremia: Secondary | ICD-10-CM | POA: Diagnosis not present

## 2019-06-04 HISTORY — PX: IR GASTROSTOMY TUBE MOD SED: IMG625

## 2019-06-04 LAB — GLUCOSE, CAPILLARY
Glucose-Capillary: 105 mg/dL — ABNORMAL HIGH (ref 70–99)
Glucose-Capillary: 108 mg/dL — ABNORMAL HIGH (ref 70–99)
Glucose-Capillary: 122 mg/dL — ABNORMAL HIGH (ref 70–99)
Glucose-Capillary: 186 mg/dL — ABNORMAL HIGH (ref 70–99)
Glucose-Capillary: 194 mg/dL — ABNORMAL HIGH (ref 70–99)
Glucose-Capillary: 215 mg/dL — ABNORMAL HIGH (ref 70–99)
Glucose-Capillary: 58 mg/dL — ABNORMAL LOW (ref 70–99)

## 2019-06-04 LAB — CBC
HCT: 25.7 % — ABNORMAL LOW (ref 39.0–52.0)
Hemoglobin: 7.5 g/dL — ABNORMAL LOW (ref 13.0–17.0)
MCH: 27.6 pg (ref 26.0–34.0)
MCHC: 29.2 g/dL — ABNORMAL LOW (ref 30.0–36.0)
MCV: 94.5 fL (ref 80.0–100.0)
Platelets: 301 10*3/uL (ref 150–400)
RBC: 2.72 MIL/uL — ABNORMAL LOW (ref 4.22–5.81)
RDW: 19.5 % — ABNORMAL HIGH (ref 11.5–15.5)
WBC: 10.6 10*3/uL — ABNORMAL HIGH (ref 4.0–10.5)
nRBC: 0.3 % — ABNORMAL HIGH (ref 0.0–0.2)

## 2019-06-04 LAB — BASIC METABOLIC PANEL
Anion gap: 10 (ref 5–15)
BUN: 43 mg/dL — ABNORMAL HIGH (ref 8–23)
CO2: 31 mmol/L (ref 22–32)
Calcium: 8.3 mg/dL — ABNORMAL LOW (ref 8.9–10.3)
Chloride: 107 mmol/L (ref 98–111)
Creatinine, Ser: 0.84 mg/dL (ref 0.61–1.24)
GFR calc Af Amer: >60 mL/min (ref >60)
GFR calc non Af Amer: >60 mL/min (ref >60)
Glucose, Bld: 158 mg/dL — ABNORMAL HIGH (ref 70–99)
Potassium: 4 mmol/L (ref 3.5–5.1)
Sodium: 148 mmol/L — ABNORMAL HIGH (ref 135–145)

## 2019-06-04 LAB — HEPARIN LEVEL (UNFRACTIONATED): Heparin Unfractionated: 0.46 IU/mL (ref 0.30–0.70)

## 2019-06-04 MED ORDER — MIDAZOLAM HCL 2 MG/2ML IJ SOLN
INTRAMUSCULAR | Status: AC | PRN
  Administered 2019-06-04: 1 mg via INTRAVENOUS

## 2019-06-04 MED ORDER — VILAZODONE HCL 20 MG PO TABS
40.0000 mg | ORAL_TABLET | Freq: Every day | ORAL | Status: DC
  Administered 2019-06-04 – 2019-06-18 (×14): 40 mg via ORAL
  Filled 2019-06-04 (×15): qty 2

## 2019-06-04 MED ORDER — FENTANYL CITRATE (PF) 100 MCG/2ML IJ SOLN
INTRAMUSCULAR | Status: AC | PRN
  Administered 2019-06-04: 50 ug via INTRAVENOUS

## 2019-06-04 MED ORDER — FUROSEMIDE 10 MG/ML IJ SOLN
20.0000 mg | Freq: Every day | INTRAMUSCULAR | Status: DC
  Administered 2019-06-04: 20 mg via INTRAVENOUS
  Filled 2019-06-04: qty 2

## 2019-06-04 MED ORDER — LIDOCAINE HCL 1 % IJ SOLN
INTRAMUSCULAR | Status: AC | PRN
  Administered 2019-06-04: 10 mL

## 2019-06-04 MED ORDER — GLUCAGON HCL RDNA (DIAGNOSTIC) 1 MG IJ SOLR
INTRAMUSCULAR | Status: AC
  Filled 2019-06-04: qty 1

## 2019-06-04 MED ORDER — LIDOCAINE HCL 1 % IJ SOLN
INTRAMUSCULAR | Status: AC
  Filled 2019-06-04: qty 20

## 2019-06-04 MED ORDER — FENTANYL CITRATE (PF) 100 MCG/2ML IJ SOLN
INTRAMUSCULAR | Status: AC
  Filled 2019-06-04: qty 2

## 2019-06-04 MED ORDER — MIDAZOLAM HCL 2 MG/2ML IJ SOLN
INTRAMUSCULAR | Status: AC
  Filled 2019-06-04: qty 2

## 2019-06-04 MED ORDER — IOHEXOL 300 MG/ML  SOLN
50.0000 mL | Freq: Once | INTRAMUSCULAR | Status: AC | PRN
  Administered 2019-06-04: 15 mL

## 2019-06-04 MED ORDER — DEXTROSE 50 % IV SOLN
12.5000 g | INTRAVENOUS | Status: AC
  Administered 2019-06-04: 12.5 g via INTRAVENOUS

## 2019-06-04 MED ORDER — DEXTROSE 50 % IV SOLN
INTRAVENOUS | Status: AC
  Filled 2019-06-04: qty 50

## 2019-06-04 NOTE — Progress Notes (Signed)
Orthopedic Tech Progress Note Patient Details:  Cody Hale 02/27/1951 [id number] RN called requesting HANGER to come out and fit brace better.  Patient ID: Cody Hale, male   DOB: Mar 21, 1951, 68 y.o.   MRN: [id number]   Janit Pagan 06/04/2019, 12:43 PM

## 2019-06-04 NOTE — Progress Notes (Signed)
Tube feedings turned off for possible peg tube placement today.

## 2019-06-04 NOTE — Progress Notes (Signed)
Hypoglycemic Event  CBG: 58  Treatment: D50 25 mL (12.5 gm) and restart tubefeeding  Symptoms: None  Follow-up CBG: Time:1830 CBG Result:108  Possible Reasons for Event: Other: tubefeedings turned off earlier for peg tube placement  Comments/MD notified:MD [place] Crane notified of event.

## 2019-06-04 NOTE — Progress Notes (Signed)
ANTICOAGULATION CONSULT NOTE - Follow Up Consult  Pharmacy Consult for Heparin Indication: atrial fibrillation  Allergies  Allergen Reactions  . Lisinopril Cough  . Daptomycin Rash  . Metformin And Related Diarrhea  . Tape Rash and Other (See Comments)    Adhesive Tape-Burn skin.    Patient Measurements: Height: 5\' 9"  (175.3 cm) Weight: 258 lb 6.1 oz (117.2 kg) IBW/kg (Calculated) : 70.7 Heparin Dosing Weight: 96.6 kg  Vital Signs: Temp: 98.1 F (36.7 C) (11/27 1142) Temp Source: Oral (11/27 1142) BP: 134/56 (11/27 1142) Pulse Rate: 78 (11/27 1142)  Labs: Recent Labs    06/02/19 0643  06/02/19 1324 06/03/19 0345 06/03/19 1106 06/03/19 2005 06/03/19 2149 06/04/19 1000 06/04/19 1115  HGB  --    < > 8.5* 8.1*  --   --  7.4*  --   --   HCT  --   --  28.6* 27.3*  --   --  25.4*  --   --   PLT  --   --   --  270  --   --   --   --   --   HEPARINUNFRC  --   --   --  0.47 0.50 0.69  --   --  0.46  CREATININE 0.60*  --   --   --   --   --   --  0.84  --    < > = values in this interval not displayed.    Estimated Creatinine Clearance: 106.3 mL/min (by C-G formula based on SCr of 0.84 mg/dL).   Assessment: 68 yr old male on heparin for afib (Xarelto PTA for Afib, LD 10/28 AM) - holding po until patient has no more procedures, possible PEG tube placement on 06/04/19.  Heparin level earlier today on heparin infusion at 2450 units/hr was 0.48 units/ml, which is within the desired goal range. Heparin infusion was stopped at ~2 PM today to recheck H/H after CT showed retroperitoneal hematomas affecting the bilateral iliacus musculature. H/H 8.5/28.6 this afternoon (stable), last platelet count (yesterday) 224. Per RN, no bleeding observed.  Pharmacy is consulted to restart heparin infusion, using lower goal range (heparin level 0.3-0.5 units/ml).  Heparin level with goal range 0.46   Goal of Therapy:  Heparin level 0.3-0.5 units/ml  Monitor platelets by anticoagulation  protocol: Yes   Plan:  Continue heparin to 2250 units/hr  Monitor daily heparin level, CBC Monitor for signs/symptoms of bleeding  Alanda Slim, PharmD, [facility] Clinical Pharmacist Please see AMION for all Pharmacists' Contact Phone Numbers 06/04/2019, 12:39 PM

## 2019-06-04 NOTE — Procedures (Signed)
Interventional Radiology Procedure Note  Procedure: Gastrostomy tube placement  Complications: None  Estimated Blood Loss: None  Findings: 20 Fr bumper retention gastrostomy tube placed with tip in body of stomach. OK to use in 24 hours.  Trayson Stitely T. Joeli Fenner, M.D Pager:  [phone]   

## 2019-06-04 NOTE — Progress Notes (Signed)
Called RN on floor to turn off heparin gtt for procedure this am. She states that pt tube feeds were not turned off last night. Advised that we will be able to do procedure after 2pm today, she verbalized understanding. IR aware. Heparin gtt to be turned off at 1100 this AM.

## 2019-06-04 NOTE — Progress Notes (Signed)
Stopped heparin infusion for Peg tube placement later today

## 2019-06-04 NOTE — Progress Notes (Signed)
Pt off unit via stretcher to IR for peg tube placement.

## 2019-06-04 NOTE — Progress Notes (Signed)
Physical Therapy Treatment Patient Details Name: Cody Hale MRN: [id number] DOB: [birth year] Today's Date: 06/04/2019    History of Present Illness Pt is a 68 yo male presented 05/06/19 to ED after fall  (he tripped over some blankets), onto his buttocks with severe back pain;  CT lumbar spine showing unstable fracture L1-2 and likely ankylosing spondylitis; tested +COVID and developed fever and required TEE to r/o endocarditis prior to surgery;  11/17 underwent open reduction internal fixation of L1-L2 Chance fracture with multilevel spine stabilization from T11-L4. Remained intubated until 07/25/18 PMHx: Afib, MVR, SVT, anxiety, CAD, COPD, CHF, HTN,     PT Comments    Pt moving slowly.  Minimally assistive overall.  Emphasis on transitions to EOB, balance EOB while donning the TLSO and transfers to chair onto a maximove lift pad.    Follow Up Recommendations  SNF(if progressing, consider CIR)     Equipment Recommendations  Other (comment)(TBD)    Recommendations for Other Services       Precautions / Restrictions Precautions Precautions: Back;Fall Precaution Booklet Issued: No Required Braces or Orthoses: Spinal Brace Spinal Brace: Thoracolumbosacral orthotic;Applied in sitting position(*order is to don in sitting) Restrictions Weight Bearing Restrictions: No    Mobility  Bed Mobility Overal bed mobility: Needs Assistance Bed Mobility: Rolling;Sidelying to Sit;Sit to Supine Rolling: Max assist;+2 for physical assistance Sidelying to sit: +2 for physical assistance;+2 for safety/equipment;Mod assist;Max assist       General bed mobility comments: cued for technique,  assisted role and hold at right side for dressiing change.  Truncal assist for transition up vis R UE..  Transfers Overall transfer level: Needs assistance   Transfers: Squat Pivot Transfers Sit to Stand: Max assist;Total assist;+2 safety/equipment        Lateral/Scoot Transfers: (x3 up toward  [facility]) General transfer comment: required use of the bed pad and knee blocking to stand and scoot toward HOB.  Ambulation/Gait             General Gait Details: unable   Stairs             Wheelchair Mobility    Modified Rankin (Stroke Patients Only)       Balance Overall balance assessment: Needs assistance Sitting-balance support: Bilateral upper extremity supported;Feet supported Sitting balance-Leahy Scale: Poor Sitting balance - Comments: pt needing external support for sitting EOB                                    Cognition Arousal/Alertness: Awake/alert Behavior During Therapy: Flat affect Overall Cognitive Status: Impaired/Different from baseline Area of Impairment: Attention;Following commands;Problem solving                   Current Attention Level: Sustained   Following Commands: Follows one step commands with increased time     Problem Solving: Slow processing;Decreased initiation;Requires verbal cues;Requires tactile cues;Difficulty sequencing        Exercises General Exercises - Lower Extremity Heel Slides: (resisted extension RLE stronger than LLE) Other Exercises Other Exercises: AAROM bil LE in hip/knee flex/ext and bil hip add/IR with stretch for better positioning at EOB    General Comments        Pertinent Vitals/Pain Pain Assessment: Faces Faces Pain Scale: Hurts even more Pain Location: back Pain Descriptors / Indicators: Discomfort;Grimacing;Moaning Pain Intervention(s): Monitored during session    Home Living  Prior Function            PT Goals (current goals can now be found in the care plan section) Acute Rehab PT Goals Patient Stated Goal: to get comfortable PT Goal Formulation: With patient Time For Goal Achievement: 06/09/19 Potential to Achieve Goals: Fair Progress towards PT goals: Progressing toward goals(very slow , BP's soft.)    Frequency    Min  5X/week      PT Plan Current plan remains appropriate    Co-evaluation              AM-PAC PT "6 Clicks" Mobility   Outcome Measure  Help needed turning from your back to your side while in a flat bed without using bedrails?: A Lot Help needed moving from lying on your back to sitting on the side of a flat bed without using bedrails?: A Lot Help needed moving to and from a bed to a chair (including a wheelchair)?: Total Help needed standing up from a chair using your arms (e.g., wheelchair or bedside chair)?: Total Help needed to walk in hospital room?: Total Help needed climbing 3-5 steps with a railing? : Total 6 Click Score: 8    End of Session Equipment Utilized During Treatment: Back brace Activity Tolerance: Treatment limited secondary to medical complications (Comment)(incr pain LLE) Patient left: in chair;with call bell/phone within reach(on maxi move pad) Nurse Communication: Mobility status;Need for lift equipment PT Visit Diagnosis: Other abnormalities of gait and mobility (R26.89);Muscle weakness (generalized) (M62.81);Pain;History of falling (Z91.81) Pain - Right/Left: (bil L>R) Pain - part of body: Hip;Leg(legs, knees, back)     Time: 4431-5400 PT Time Calculation (min) (ACUTE ONLY): 42 min  Charges:  $Therapeutic Activity: 23-37 mins $Neuromuscular Re-education: 8-22 mins                     06/04/2019  Donnella Sham, PT Acute Rehabilitation Services [phone]  (pager) [phone]  (office)   Tessie Fass Pennie Vanblarcom 06/04/2019, 12:30 PM

## 2019-06-04 NOTE — Progress Notes (Signed)
PROGRESS NOTE  Cody Hale RUE:[id number]:[id number] DOB: 02-02-1951 DOA: 05/06/2019 PCP: Etta GrandchildJones, Thomas L, MD  HPI/Recap of past 24 hours: Cody SitesCarlis Ickes is a 68 y.o. 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  Patient fell onto his buttock.  Started hurting and was brought to the ER.  Denies any incontinence of urine or bowel.  Patient's pain has been significant.  ED Course: In the ER at [facility] MRI showed unstable lumbar fracture and patient was [facility].  Patient had Covid test done which was positive.  Patient otherwise is denying any shortness of breath has been a some productive cough chest x-ray was showing cardiomegaly and nothing acute.  Other labs show BNP of 326 LDH 331 CRP 14.7 WBC 11.9 hemoglobin 13.8 platelets 104 creatinine 1.3.  Neurosurgery has been consulted patient is being admitted for further management of lumbar fracture with COVID-19 being positive.     Brief interval history:Patient with a history of atrial fibrillation on chronic anticoagulation with Xarelto, CAD, history of endocarditis, left bundle branch block, history of mitral valve replacement, chronic systolic heart failure, SVT, COPD, chronic venous insufficiency, and hypertension who presented on 10/29 after a fall.  He was found to have an unstable fracture of his lumbar spine but was also found to be positive for (asymptomatic) COVID-19 infection.   While waiting for surgical clearance (increased risk of complications in the setting of surgery with COVID infection) and developed GBS bacteremia, now off antibiotics.  He had multilevel spinal fusion on 11/17.  He was difficult to ventilate in the OR and so kept intubated overnight.  Patient was previously on the hospitalist service.   Currently on RA.  He  is quite deconditioned due to prolonged hospitalization.  He will need aggressive pulmonary toilet.  PT is seeing him.  Neurosurgery is following and he has a drain in place, but he is stable from this standpoint.  He is likely to need rehab - CIR vs. SNF, likely too early to tell which.  TRH will resume care of the patient on 11/21.  11/21: Nonproductive cough and back pain. 11/23: Right hip pain.   11/24: Bedside RN reports 30 beats of V. tach overnight, asymptomatic.  Twelve-lead EKG showed A. fib with frequent PVCs.  Last TEE done on 05/20/2019 showed LVEF 40 to 45%.  Cardiology consulted.  11/25: Discussed with IR for possible peg tube placement on 06/04/19. CT abd pelvis done to look at the structure showed incidentally found retroperitoneal hematomas affecting b/l iliacus musculature similar in hind site to lumbar spine CT performed 05/25/2019. Correlation with hematocrit levels is advised. Will hold hep drip until repeat H&H results. Hg trend up 11/26: Plan for PEG tube placement 06/04/2019 by interventional radiology.  06/04/19: Seen and examined at his bedside this morning.  He is more alert and interactive this morning.  Alert and oriented to place.  Planned peg tube placement by interventional radiology today.    Assessment/Plan: Active Problems:   OSA (obstructive sleep apnea)   Type II diabetes mellitus with manifestations (HCC)   Chronic systolic CHF (congestive heart failure) (HCC)   Hyperlipidemia with target LDL less than 70   Chronic atrial fibrillation   Chronic respiratory failure with hypoxia (HCC)   Unstable burst fracture of unspecified lumbar vertebra, initial encounter for closed fracture (HCC)  Closed unstable burst fracture of lumbar vertebra (HCC)   Closed unstable burst fracture of first lumbar vertebra (HCC)   Pressure injury of skin   Bacteremia   Hyponatremia   NSVT (nonsustained ventricular tachycardia) (HCC)  POD #11 post T11-L4 fusion for L1 Chance  fracture. Seen by neurosurgery, no new recommendations, signed off on 05/31/2019. Pain management and bowel regimen as needed.  Retroperitoneal Hematomas affecting iliacus musculature similar in hind site to lumbar spine CT performed 05/25/2019. Correlation with hematocrit levels is advised. Held heparin drip briefly. H&H trended up. Resumed hep drip.  Restarted heparin drip at lower dose on 06/02/2019>> trend up of hemoglobin from 8.1 to 8.5>> chronic a-fib with elevated CHADSVASC, recent covid infection. High risk for thromboembolism. CBC ordered end pending  Dysphagia, coretrack in place Failed swallow evaluation by speech therapist consistently IR consulted. Plan for peg tube placement on 06/04/19 by IR. NPO after midnight and off blood thinners for at least 3 hrs RT suctioning BID C/w aspiration precautions  Acute hypoxic respiratory failure possible silent aspiration O2 sat drop this am with audible rhonchorous sounds and wet cough Will RT suction BID C/w aspiration precautions>> head of bed elevated >30 degrees during feeding Independently reviewed chest x-ray done on 06/03/2019 which shows left lung base infiltrates suggestive of pneumonia cardiomegaly and increasing pulmonary vascularity. Continue to maintain O2 saturation greater than 92%  Fever with T-max 102 on 06/03/2019 Chest x-ray obtained, findings as stated above Sputum culture and blood cultures obtained Sputum culture shows moderate gram-positive rods and rare gram positive cocci in pairs. Blood cultures negative to date Continue to follow cultures Started on IV antibiotics empirically, he is on cefepime and IV vancomycin. Continue to monitor fever curve and WBC  Acute hypoxic hypercarbic respiratory failure requiring invasive mechanical ventilation likely secondary to COVID-19 pneumonia versus acute COPD exacerbation Not On oxygen supplementation at baseline Extubated on 05/26/2019 Currently on nasal cannula 3  L with O2 saturation in the upper 90s Continue pulmonary toilet; add hypersaline nebs BID Continue to maintain O2 saturation greater than 92% RT suctioning twice daily  Nonsustained V. Tach 30 runs of V. tach overnight, asymptomatic Twelve-lead EKG shows A. fib with frequent PVCs TEE done on 05/20/2019 showed LVEF 40 to 45% Cardiology following. Beta-blocker dose adjusted by cardiology with improvement Continue to monitor on telemetry  Acute urinary retention Indwelling Foley catheter placed on 05/29/2019, removed on 06/01/2019. Bethanechol started on 05/30/2019, continue Continue bethanechol, good urine output 2.3 L recorded in the last 24 hours. Continue to monitor urine output  Chronic A. Fib Rate is controlled Continue Coreg for rate control DC hep drip for now  Streptococcous Group G bacteremia, poa Blood cx x 2 positive on 05/10/19. Repeat blood cx x2 done on 05/11/19 staph coag neg x1 (likely contaminant) and other bottle negative growth final. Completed course of treatment Blood cultures drawn on 05/29/2019 due to concern for fever negative times less than 24-hour We will continue to follow cultures  Treated COVID-19 viral pneumonia  Treated with 5 days of remdesivir Continue bronchodilators and Lasix via NG tube Continue to maintain O2 saturation greater than 94%  COPD exacerbation Continue bronchodialators Independently viewed chest x-ray which shows mild bibasilar atelectasis. Rest of management as stated above  Chronic systolic CHF Last 2D echo showed LVEF 40 to 45% on 05/20/2019. Continue strict I's and O's and daily weight Continue current medication: On Coreg 3.125 mg twice daily, losartan 12.5 mg daily,Torsemide 20 mg daily  GERD Continue PPI  Hyperlipidemia Continue statin  Physical debility/ambulatory dysfunction PT assessment recommended to continue improvement toward CIR. OT assessed and recommended SNF with 24-hour supervision/assistance and 3  in 1 bedside commode. Continue PT OT with assistance and fall precautions CSW consulted to assist with placement   Code Status: Full code  Family Communication: None at bedside.  Disposition Plan: SNF in the next 48 to 72 hours.  Consultants:  Neurosurgery, signed off on 05/31/2019.  PCCM  Cardiology consulted on 06/01/2019.  Interventional radiology  Procedures: 11/17: PROCEDURE: 2. Open reduction, internal fixation L1-2 Chance fracture 3. Posterior segmental instrumented pedicle screw stabilization, T11-L4 4. Posterolateral arthrodesis, T11-L4 Use of non-structural morcellized bone allograft - DBX 05/25/2019 intubation in OR per anesthesia>>extubated 11/18 Antimicrobials: Penicillin G 11/3-11/17  Significant Hospital Events   10/29 admitted for back pain  10/29 positive Covid test-asymptomatic GBS bacteremia, TEE negative for endocarditis.  Completed antibiotics on 1116 Mild exacerbation of COPD requiring diuresis 11/12 Multilevel spinal stabilization 11/17.   DVT prophylaxis: SCds  Objective: Vitals:   06/04/19 0421 06/04/19 0431 06/04/19 0732 06/04/19 1142  BP: 121/65  106/60 (!) 134/56  Pulse: 79  82 78  Resp: 14  18 (!) 22  Temp: 98.9 F (37.2 C)  98.7 F (37.1 C) 98.1 F (36.7 C)  TempSrc: Oral  Oral Oral  SpO2: 96%  97% 96%  Weight:  117.2 kg    Height:        Intake/Output Summary (Last 24 hours) at 06/04/2019 1154 Last data filed at 06/04/2019 0946 Gross per 24 hour  Intake 10 ml  Output 1600 ml  Net -1590 ml   Filed Weights   06/02/19 0440 06/03/19 0347 06/04/19 0431  Weight: 114.4 kg 113.7 kg 117.2 kg    Exam:  General: 68 y.o. year-old male well-developed and nourished in no acute distress.  Alert and interactive.  Coretrack in place Cardiovascular: Irregular rate and rhythm no rubs or gallops.  Respiratory: Very mild rales at bases.  No wheezing noted.  Poor inspiratory effort.   Abdomen: Obese nontender bowel sounds present.    Musculoskeletal: Trace lower extremity edema bilaterally. Psychiatry: Mood is appropriate for condition and setting. Data Reviewed: CBC: Recent Labs  Lab 05/29/19 1511 05/30/19 0631 05/31/19 0500 06/01/19 0316 06/02/19 1324 06/03/19 0345 06/03/19 2149  WBC 6.7 4.4 3.8* 4.3  --  6.9  --   NEUTROABS 5.6  --   --   --   --   --   --   HGB 7.9* 7.9* 7.8* 8.1* 8.5* 8.1* 7.4*  HCT 26.7* 27.0* 27.1* 27.9* 28.6* 27.3* 25.4*  MCV 93.7 95.4 96.1 95.9  --  95.1  --   PLT 167 173 197 224  --  270  --    Basic Metabolic Panel: Recent Labs  Lab 05/31/19 0500 06/01/19 0050 06/01/19 0316 06/02/19 0643 06/04/19 1000  NA 144 146* 146* 145 148*  K 4.8 4.7 4.7 4.3 4.0  CL 104 105 105 104 107  CO2 35* 35* 34* 32 31  GLUCOSE 193* 179* 197* 196* 158*  BUN 43* 37* 36* 29* 43*  CREATININE 0.63 0.57* 0.57* 0.60* 0.84  CALCIUM 8.3* 8.5* 8.6* 8.4* 8.3*  MG  --  2.5*  --  2.3  --   PHOS  --  3.3  --   --   --    GFR: Estimated Creatinine Clearance: 106.3 mL/min (by C-G formula based on SCr of 0.84 mg/dL). Liver Function Tests: No results for input(s): AST, ALT, ALKPHOS,  BILITOT, PROT, ALBUMIN in the last 168 hours. No results for input(s): LIPASE, AMYLASE in the last 168 hours. No results for input(s): AMMONIA in the last 168 hours. Coagulation Profile: No results for input(s): INR, PROTIME in the last 168 hours. Cardiac Enzymes: No results for input(s): CKTOTAL, CKMB, CKMBINDEX, TROPONINI in the last 168 hours. BNP (last 3 results) No results for input(s): PROBNP in the last 8760 hours. HbA1C: No results for input(s): HGBA1C in the last 72 hours. CBG: Recent Labs  Lab 06/03/19 2006 06/04/19 0011 06/04/19 0428 06/04/19 0736 06/04/19 1142  GLUCAP 174* 215* 186* 194* 105*   Lipid Profile: No results for input(s): CHOL, HDL, LDLCALC, TRIG, CHOLHDL, LDLDIRECT in the last 72 hours. Thyroid Function Tests: No results for input(s): TSH, T4TOTAL, FREET4, T3FREE, THYROIDAB in the last 72  hours. Anemia Panel: No results for input(s): VITAMINB12, FOLATE, FERRITIN, TIBC, IRON, RETICCTPCT in the last 72 hours. Urine analysis:    Component Value Date/Time   COLORURINE YELLOW 05/19/2019 1355   APPEARANCEUR CLEAR 05/19/2019 1355   APPEARANCEUR Clear 07/23/2017   LABSPEC 1.013 05/19/2019 1355   PHURINE 5.0 05/19/2019 1355   GLUCOSEU NEGATIVE 05/19/2019 1355   GLUCOSEU 500 (A) 07/14/2017 1053   HGBUR SMALL (A) 05/19/2019 1355   BILIRUBINUR NEGATIVE 05/19/2019 1355   BILIRUBINUR Negative 07/23/2017   KETONESUR NEGATIVE 05/19/2019 1355   PROTEINUR NEGATIVE 05/19/2019 1355   UROBILINOGEN 0.2 07/14/2017 1053   NITRITE NEGATIVE 05/19/2019 1355   LEUKOCYTESUR NEGATIVE 05/19/2019 1355   Sepsis Labs: @LABRCNTIP (procalcitonin:4,lacticidven:4)  ) Recent Results (from the past 240 hour(s))  Culture, blood (routine x 2)     Status: None   Collection Time: 05/29/19  4:14 PM   Specimen: BLOOD LEFT HAND  Result Value Ref Range Status   Specimen Description BLOOD LEFT HAND  Final   Special Requests AEROBIC BOTTLE ONLY Blood Culture adequate volume  Final   Culture   Final    NO GROWTH 5 DAYS Performed at [facility], [address], [place], [place] [address]    Report Status 06/03/2019 FINAL  Final  Culture, blood (routine x 2)     Status: None   Collection Time: 05/29/19  4:14 PM   Specimen: BLOOD LEFT HAND  Result Value Ref Range Status   Specimen Description BLOOD LEFT HAND  Final   Special Requests AEROBIC BOTTLE ONLY Blood Culture adequate volume  Final   Culture   Final    NO GROWTH 5 DAYS Performed at [facility], [address], [place], [place] [address]    Report Status 06/03/2019 FINAL  Final  Expectorated sputum assessment w rflx to resp cult     Status: None (Preliminary result)   Collection Time: 06/03/19  9:44 PM   Specimen: Expectorated Sputum  Result Value Ref Range Status   Specimen Description EXPECTORATED SPUTUM  Final   Special  Requests Normal  Final   Sputum evaluation   Final    Sputum specimen not acceptable for testing.  Please recollect.   RESULT CALLED TO, READ BACK BY AND VERIFIED WITH: H HUNT RN 06/04/19 0100 JDW Performed at [facility], [address], [place], [place] [address]    Report Status PENDING  Incomplete  Expectorated sputum assessment w rflx to resp cult     Status: None (Preliminary result)   Collection Time: 06/04/19  3:21 AM   Specimen: SPU  Result Value Ref Range Status   Specimen Description SPUTUM  Final  Special Requests NONE  Final   Sputum evaluation   Final    MODERATE WBC PRESENT, PREDOMINANTLY PMN MODERATE GRAM POSITIVE RODS RARE GRAM POSITIVE COCCI IN PAIRS Performed at [facility], [address] [place], [place] [address]    Report Status PENDING  Incomplete      Studies: Dg Chest Port 1 View  Result Date: 06/03/2019 CLINICAL DATA:  Hypoxia. EXAM: PORTABLE CHEST 1 VIEW COMPARISON:  Eleven 20 undo that 05/28/2019 FINDINGS: Right-sided PICC line terminates in the area of the caval to atrial junction. Enteric tube courses through in off the field of the radiograph. Signs of median sternotomy and partial visualization of lumbar spinal fusion as before. Cardiomediastinal contours are enlarged with signs of in-situ creased interstitial prominence bilaterally. No dense consolidation but with worsening opacification in the retrocardiac region compared to prior study. Blunting of left costophrenic angle. No acute bone process. Deformity of bilateral ribs is similar to prior study and also noted on 06/02/2019 CT of the abdomen pelvis. IMPRESSION: Findings suggestive of volume overload or heart failure. Can not exclude developing infection in the left lung base. Cardiomegaly as before. Electronically Signed   By: Donzetta Kohut M.D.   On: 06/03/2019 19:24    Scheduled Meds:  ascorbic acid  500 mg Per Tube Daily   bethanechol  10 mg Per Tube BID   carvedilol   3.125 mg Per Tube BID WC   chlorhexidine  15 mL Mouth Rinse BID   Chlorhexidine Gluconate Cloth  6 each Topical Daily   fluticasone  2 spray Each Nare Daily   fluticasone furoate-vilanterol  1 puff Inhalation Daily   furosemide  20 mg Intravenous Daily   insulin aspart  0-20 Units Subcutaneous Q4H   insulin aspart  4 Units Subcutaneous TID WC   insulin detemir  40 Units Subcutaneous BID   levalbuterol  0.63 mg Nebulization TID   liver oil-zinc oxide   Topical QID   loratadine  10 mg Per Tube QPM   [START ON 06/05/2019] losartan  12.5 mg Per Tube Daily   mouth rinse  15 mL Mouth Rinse q12n4p   multivitamin with minerals  1 tablet Per Tube Daily   pantoprazole sodium  40 mg Per Tube Daily   simvastatin  20 mg Per Tube q morning - 10a   sodium chloride flush  10-40 mL Intracatheter Q12H   sodium chloride flush  3 mL Intravenous Q12H   sodium chloride HYPERTONIC  4 mL Nebulization BID   torsemide  20 mg Per Tube q1800   umeclidinium bromide  1 puff Inhalation Daily   Vilazodone HCl  40 mg Oral Daily    Continuous Infusions:  sodium chloride 250 mL (06/03/19 2141)   ceFEPime (MAXIPIME) IV 2 g (06/04/19 0503)   feeding supplement (PIVOT 1.5 CAL) Stopped (06/04/19 0734)   heparin Stopped (06/04/19 1105)   vancomycin 2,000 mg (06/03/19 2140)     LOS: 29 days     Darlin Drop, MD [facility] Pager [phone]  If 7PM-7AM, please contact night-coverage [email or web address] Password [facility] 06/04/2019, 11:54 AM

## 2019-06-05 ENCOUNTER — Inpatient Hospital Stay (HOSPITAL_COMMUNITY): Payer: Medicare Other

## 2019-06-05 DIAGNOSIS — R7881 Bacteremia: Secondary | ICD-10-CM | POA: Diagnosis not present

## 2019-06-05 DIAGNOSIS — S32012A Unstable burst fracture of first lumbar vertebra, initial encounter for closed fracture: Secondary | ICD-10-CM | POA: Diagnosis not present

## 2019-06-05 LAB — CBC
HCT: 23.7 % — ABNORMAL LOW (ref 39.0–52.0)
Hemoglobin: 6.9 g/dL — CL (ref 13.0–17.0)
MCH: 27.6 pg (ref 26.0–34.0)
MCHC: 29.1 g/dL — ABNORMAL LOW (ref 30.0–36.0)
MCV: 94.8 fL (ref 80.0–100.0)
Platelets: 285 10*3/uL (ref 150–400)
RBC: 2.5 MIL/uL — ABNORMAL LOW (ref 4.22–5.81)
RDW: 19.7 % — ABNORMAL HIGH (ref 11.5–15.5)
WBC: 8.9 10*3/uL (ref 4.0–10.5)
nRBC: 0.7 % — ABNORMAL HIGH (ref 0.0–0.2)

## 2019-06-05 LAB — PREPARE RBC (CROSSMATCH)

## 2019-06-05 LAB — GLUCOSE, CAPILLARY
Glucose-Capillary: 124 mg/dL — ABNORMAL HIGH (ref 70–99)
Glucose-Capillary: 154 mg/dL — ABNORMAL HIGH (ref 70–99)
Glucose-Capillary: 171 mg/dL — ABNORMAL HIGH (ref 70–99)
Glucose-Capillary: 215 mg/dL — ABNORMAL HIGH (ref 70–99)
Glucose-Capillary: 238 mg/dL — ABNORMAL HIGH (ref 70–99)
Glucose-Capillary: 41 mg/dL — CL (ref 70–99)
Glucose-Capillary: 80 mg/dL (ref 70–99)

## 2019-06-05 LAB — EXPECTORATED SPUTUM ASSESSMENT W GRAM STAIN, RFLX TO RESP C: Special Requests: NORMAL

## 2019-06-05 LAB — HEMOGLOBIN AND HEMATOCRIT, BLOOD
HCT: 28.2 % — ABNORMAL LOW (ref 39.0–52.0)
Hemoglobin: 8.3 g/dL — ABNORMAL LOW (ref 13.0–17.0)

## 2019-06-05 LAB — HEPARIN LEVEL (UNFRACTIONATED): Heparin Unfractionated: 0.22 IU/mL — ABNORMAL LOW (ref 0.30–0.70)

## 2019-06-05 LAB — VANCOMYCIN, TROUGH: Vancomycin Tr: 15 ug/mL (ref 15–20)

## 2019-06-05 MED ORDER — OSMOLITE 1.5 CAL PO LIQD
1000.0000 mL | ORAL | Status: DC
  Administered 2019-06-05 – 2019-06-07 (×4): 1000 mL
  Filled 2019-06-05 (×10): qty 1000

## 2019-06-05 MED ORDER — PRO-STAT SUGAR FREE PO LIQD
30.0000 mL | Freq: Two times a day (BID) | ORAL | Status: DC
  Administered 2019-06-06 – 2019-06-11 (×11): 30 mL
  Filled 2019-06-05 (×11): qty 30

## 2019-06-05 MED ORDER — TORSEMIDE 20 MG PO TABS: 20.0000 mg | ORAL_TABLET | Freq: Every day | ORAL | Status: DC

## 2019-06-05 MED ORDER — LOSARTAN POTASSIUM 25 MG PO TABS
12.5000 mg | ORAL_TABLET | Freq: Every day | ORAL | Status: DC
  Filled 2019-06-05: qty 0.5

## 2019-06-05 MED ORDER — LEVALBUTEROL HCL 0.63 MG/3ML IN NEBU: 0.6300 mg | INHALATION_SOLUTION | Freq: Four times a day (QID) | RESPIRATORY_TRACT | Status: DC | PRN

## 2019-06-05 MED ORDER — DEXTROSE 50 % IV SOLN
25.0000 g | INTRAVENOUS | Status: AC
  Administered 2019-06-05: 16:00:00 25 g via INTRAVENOUS
  Filled 2019-06-05: qty 50

## 2019-06-05 MED ORDER — FUROSEMIDE 10 MG/ML IJ SOLN
20.0000 mg | Freq: Every day | INTRAMUSCULAR | Status: DC
  Administered 2019-06-06 – 2019-06-07 (×2): 20 mg via INTRAVENOUS
  Filled 2019-06-05 (×2): qty 2

## 2019-06-05 MED ORDER — CARVEDILOL 3.125 MG PO TABS
3.1250 mg | ORAL_TABLET | Freq: Two times a day (BID) | ORAL | Status: DC
  Filled 2019-06-05: qty 1

## 2019-06-05 MED ORDER — SODIUM CHLORIDE 0.9% IV SOLUTION
Freq: Once | INTRAVENOUS | Status: AC
  Administered 2019-06-05: 08:00:00 via INTRAVENOUS

## 2019-06-05 MED ORDER — FREE WATER
200.0000 mL | Status: DC
  Administered 2019-06-05 – 2019-06-18 (×69): 200 mL

## 2019-06-05 NOTE — Progress Notes (Signed)
Hypoglycemic Event  CBG: 41   Treatment: D50 50 mL (25 gm) and started on new tubefeedings per order  Symptoms: None  Follow-up CBG: Time:1655 CBG Result:124  Possible Reasons for Event: Other: stopped tubfeedings  Comments/MD notified:MD [place] Crane paged

## 2019-06-05 NOTE — Progress Notes (Signed)
ANTICOAGULATION CONSULT NOTE - Follow Up Consult  Pharmacy Consult for heparin Indication: atrial fibrillation  Labs: Recent Labs    06/02/19 0643  06/03/19 0345  06/03/19 2005 06/03/19 2149 06/04/19 1000 06/04/19 1115 06/04/19 1815 06/05/19 0448  HGB  --    < > 8.1*  --   --  7.4*  --   --  7.5* 6.9*  HCT  --    < > 27.3*  --   --  25.4*  --   --  25.7* 23.7*  PLT  --   --  270  --   --   --   --   --  301 285  HEPARINUNFRC  --   --  0.47   < > 0.69  --   --  0.46  --  0.22*  CREATININE 0.60*  --   --   --   --   --  0.84  --   --   --    < > = values in this interval not displayed.    Assessment/Plan:  68yo male subtherapeutic on heparin though had been held for PEG placement and was just resumed at 10p. Will continue gtt at current rate and check additional level.   Wynona Neat, PharmD, BCPS  06/05/2019,5:40 AM

## 2019-06-05 NOTE — Plan of Care (Signed)
  Problem: Nutrition: Goal: Adequate nutrition will be maintained Outcome: Progressing Note: DHT tube discontinued per order, J tube intact and flushed. Started him on new tubefeeding Osmolite 1.5 at 49ml/hr.Will increase rate to goal per order. No issues at this time, will continue to monitor.   Problem: Pain Managment: Goal: General experience of comfort will improve Outcome: Progressing Note: Pt continued to complain of back pain r/t surgical incision and left knee pain. Pain is being addressed by PRN pain medication and repositioning for comfort. Will continue to monitor.

## 2019-06-05 NOTE — Progress Notes (Signed)
Patient had a abdominal CT done to evaluate for PEG placement and noted to have bilateral retroperitoneal hematomas.  Hbg has dropped to 6.9.  Remains in afib with CVR and BP soft at 101/9mmHg.  IV Heparin now off due to bleeding.  He has chronic afib and at increased risk of cardioembolic events off anticoagulation, but in the setting of life threatening bleeding with hbg 6.9, risks of further anticoagulation far out weight the benefits on the heart. Patient being transfused.   Consider getting surgical consult.

## 2019-06-05 NOTE — Progress Notes (Signed)
S/p perc G-tube yesterday. BP (!) 98/51   Pulse 77   Temp 98.1 F (36.7 C) (Oral)   Resp (!) 21   Ht 5\' 9"  (1.753 m)   Wt 117.2 kg   SpO2 91%   BMI 38.16 kg/m  G-tube intact, site clean, no bleeding or leak. Bumper snug but not too tight. Ok to use for TF as recommended by RD. Call IR with issues.  Ascencion Dike PA-C Interventional Radiology 06/05/2019 11:24 AM

## 2019-06-05 NOTE — Progress Notes (Signed)
Nutrition Follow-up  RD working remotely.  DOCUMENTATION CODES:   Obesity unspecified  INTERVENTION:   -D/c Pivot 1.5  Initiate Osmolite 1.5 @ 20 ml/hr via g-tube and increase by 10 ml every 4 hours to goal rate of 60 ml/hr.   30 ml Prostat BID.    200 ml free water flush every 4 hours  Tube feeding regimen provides 2360 kcal (100% of needs), 120 grams of protein, and 2297 ml of H2O.   NUTRITION DIAGNOSIS:   Increased nutrient needs related to wound healing, acute illness as evidenced by estimated needs.  Ongoing  GOAL:   Patient will meet greater than or equal to 90% of their needs  Progressing  MONITOR:   Diet advancement, Labs, Weight trends, TF tolerance, Skin, I & O's  REASON FOR ASSESSMENT:   Consult Enteral/tube feeding initiation and management  ASSESSMENT:   68 y.o. 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  Patient fell onto his buttock.Patient is being admitted for further management of lumbar fracture.  10/29 admit, Covid+ 11/13 Cortrak tube placed 11/17 ORIF of L1-L2; multilevel spine stabilization from T11-L4 11/27- s/p g-tube placement  Reviewed I/O's: -3.2 L x 24 hours and -3.6 L since 05/22/19  UOP: 3.2 L x 24 hours  Pt with hypoglycemic even secondary to TF on hold secondary to PEG placement. SLP continues to follow pt; recommend continued NPO status with nutrition via alternative means.   Per IR notes, ok to use g-tube 24 hours after placement. RD will transition to standard formula as pt has transitioned out of the ICU.   Labs reviewed: CBGS: 171 (inpatient orders for glycemic control are 0-20 units insulin aspart every 4 hours, 4 units insulin aspart TID with meals, and 40 units insulin detemir BID).   Diet Order:   Diet Order            Diet NPO  time specified Except for: Sips with Meds  Diet effective midnight              EDUCATION NEEDS:   No education needs have been identified at this time  Skin:  Skin Assessment: Skin Integrity Issues: Skin Integrity Issues:: Stage II, Incisions Stage II: rt and lt buttocks Incisions: closed back  Last BM:  06/02/19  Height:   Ht Readings from Last 1 Encounters:  05/06/19 5\' 9"  (1.753 m)    Weight:   Wt Readings from Last 1 Encounters:  06/04/19 117.2 kg    Ideal Body Weight:  72.7 kg  BMI:  Body mass index is 38.16 kg/m.  Estimated Nutritional Needs:   Kcal:  2200-2400  Protein:  115-130 grams  Fluid:  > 2 L    Mackenzey Crownover A. Jimmye Norman, RD, LDN, [place] Registered Dietitian II Certified Diabetes Care and Education Specialist Pager: [phone] After hours Pager: [phone]

## 2019-06-05 NOTE — Progress Notes (Signed)
PROGRESS NOTE  Cody Hale EEF:[id number] DOB: 17-Jun-1951 DOA: 05/06/2019 PCP: [name], MD  HPI/Recap of past 24 hours: Cody Hale is a 68 y.o. 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  Patient fell onto his buttock.  Started hurting and was brought to the ER.  Denies any incontinence of urine or bowel.  Patient's pain has been significant.  ED Course: In the ER at [facility] MRI showed unstable lumbar fracture and patient was transferred to [facility].  Patient had Covid test done which was positive.  Patient otherwise is denying any shortness of breath has been a some productive cough chest x-ray was showing cardiomegaly and nothing acute.  Other labs show BNP of 326 LDH 331 CRP 14.7 WBC 11.9 hemoglobin 13.8 platelets 104 creatinine 1.3.  Neurosurgery has been consulted patient is being admitted for further management of lumbar fracture with COVID-19 being positive.     Brief interval history:Patient with a history of atrial fibrillation on chronic anticoagulation with Xarelto, CAD, history of endocarditis, left bundle branch block, history of mitral valve replacement, chronic systolic heart failure, SVT, COPD, chronic venous insufficiency, and hypertension who presented on 10/29 after a fall.  He was found to have an unstable fracture of his lumbar spine but was also found to be positive for (asymptomatic) COVID-19 infection.   While waiting for surgical clearance (increased risk of complications in the setting of surgery with COVID infection) and developed GBS bacteremia, now off antibiotics.  He had multilevel spinal fusion on 11/17.  He was difficult to ventilate in the OR and so kept intubated overnight.  Patient was previously on the hospitalist service.   Currently on RA.  He  is quite deconditioned due to prolonged hospitalization.  He will need aggressive pulmonary toilet.  PT is seeing him.  Neurosurgery is following and he has a drain in place, but he is stable from this standpoint.  He is likely to need rehab - CIR vs. SNF, likely too early to tell which.  TRH will resume care of the patient on 11/21.  11/21: Nonproductive cough and back pain. 11/23: Right hip pain.   11/24: Bedside RN reports 30 beats of V. tach overnight, asymptomatic.  Twelve-lead EKG showed A. fib with frequent PVCs.  Last TEE done on 05/20/2019 showed LVEF 40 to 45%.  Cardiology consulted.  11/25: Discussed with IR for possible peg tube placement on 06/04/19. CT abd pelvis done to look at the structure showed incidentally found retroperitoneal hematomas affecting b/l iliacus musculature similar in hind site to lumbar spine CT performed 05/25/2019. Correlation with hematocrit levels is advised. Will hold hep drip until repeat H&H results. Hg trend up 11/26: Plan for PEG tube placement 06/04/2019 by interventional radiology.  11/27: POD #0 post peg tube placement by interventional radiology on 06/04/19.  06/05/19: Patient was seen and examined at his bedside this morning.  He is more alert and interactive.  Hemoglobin dropped this morning, 6.9.  Stopped IV heparin.  1 unit PRBC ordered to be transfused.  Stat CT abdomen and pelvis showed enlarging bilateral retroperitoneal hematomas.  Cardiology team PA Barrett paged as re-consult and made aware.    Assessment/Plan: Active Problems:   OSA (obstructive sleep apnea)   Type II diabetes mellitus with manifestations (HCC)   Chronic systolic CHF (congestive heart failure) (  [place])   Hyperlipidemia with target LDL less than 70   Chronic atrial fibrillation   Chronic respiratory failure with hypoxia (HCC)   Unstable burst fracture of unspecified lumbar vertebra, initial encounter for closed fracture (HCC)   Closed unstable burst fracture of lumbar  vertebra (HCC)   Closed unstable burst fracture of first lumbar vertebra (HCC)   Pressure injury of skin   Bacteremia   Hyponatremia   NSVT (nonsustained ventricular tachycardia) (HCC)  POD #12 post T11-L4 fusion for L1 Chance fracture. Seen by neurosurgery, no new recommendations, signed off on 05/31/2019. Pain management and bowel regimen as needed.  Enlarging retroperitoneal Hematomas affecting iliacus musculature bilaterally similar in hind site to lumbar spine CT performed 05/25/2019. Correlation with hematocrit levels is advised. Held heparin drip briefly. H&H trended up. Resumed hep drip.  Restarted heparin drip at lower dose on 06/02/2019>> trend up of hemoglobin from 8.1 to 8.5>> chronic a-fib with CHADSVASC 4, recent covid infection. High risk. 06/05/19:Hg dropped to 6.9>> heparin drip stopped. 1U PRBC ordered to be transfused. CT abd pelvis done on 06/05/2019 showed enlarging retroperitoneal hematomas affecting iliacus musculature bilaterally.  Continue to hold off anticoagulation  Dysphagia post PEG tube placement by IR on 06/04/2019 Continue PEG tube feeding Continue aspiration precautions Continue frequent suctioning by RT Coretract removed on 06/05/2019.  Acute hypoxic respiratory failure possible silent aspiration O2 sat drop this am with audible rhonchorous sounds and wet cough Will RT suction BID C/w aspiration precautions>> head of bed elevated >30 degrees during feeding Independently reviewed chest x-ray done on 06/03/2019 which shows left lung base infiltrates suggestive of pneumonia cardiomegaly and increasing pulmonary vascularity. Continue to maintain O2 saturation greater than 92% Started gentle IV Lasix 20 mg daily, judicious, due to soft blood pressures.  Essential hypertension Blood pressure is Continue to hold blood pressure medications Continue MAP greater than 65 Continue to closely monitor vital signs  Fever with T-max 102 on 06/03/2019 Chest  x-ray obtained, findings as stated above Sputum culture and blood cultures obtained Sputum culture shows moderate gram-positive rods and rare gram positive cocci in pairs. Blood cultures negative to date Continue to follow cultures Continue IV antibiotics empirically, he is on cefepime and IV vancomycin. Continue to monitor fever curve and WBC  Acute hypoxic hypercarbic respiratory failure requiring invasive mechanical ventilation likely secondary to COVID-19 pneumonia versus acute COPD exacerbation Not On oxygen supplementation at baseline Extubated on 05/26/2019 Currently on nasal cannula 3 L with O2 saturation in the upper 90s Continue pulmonary toilet; add hypersaline nebs BID Continue to maintain O2 saturation greater than 92% RT suctioning twice daily  Nonsustained V. Tach 30 runs of V. tach overnight, asymptomatic Twelve-lead EKG shows A. fib with frequent PVCs TEE done on 05/20/2019 showed LVEF 40 to 45% Cardiology following. Beta-blocker dose adjusted by cardiology with improvement Continue to monitor on telemetry  Acute urinary retention Indwelling Foley catheter placed on 05/29/2019, removed on 06/01/2019. Bethanechol started on 05/30/2019, continue Continue bethanechol, good urine output 2.3 L recorded in the last 24 hours. Continue to monitor urine output  Chronic A. Fib Hold off Coreg due to soft blood pressures Hold off anticoagulation due to retroperitoneal hematoma  Streptococcous Group G bacteremia, poa Blood cx x 2 positive on 05/10/19. Repeat blood cx x2 done on 05/11/19 staph coag neg x1 (likely contaminant) and other bottle negative growth final. Completed course of treatment Blood cultures drawn on 05/29/2019 due to concern for fever negative times less than 24-hour Continue to follow cultures  Treated COVID-19 viral pneumonia  Treated with 5 days of remdesivir Continue bronchodilators and Lasix via NG tube Continue to maintain O2 saturation greater  than 94%  COPD exacerbation Continue bronchodialators Independently viewed chest x-ray which shows mild bibasilar atelectasis. Rest of management as stated above  Chronic systolic CHF Last 2D echo showed LVEF 40 to 45% on 05/20/2019. Continue strict I's and O's and daily weight Continue current medication: On Coreg 3.125 mg twice daily, losartan 12.5 mg daily,Torsemide 20 mg daily  GERD Continue PPI  Hyperlipidemia Continue statin  Physical debility/ambulatory dysfunction PT assessment recommended to continue improvement toward CIR. OT assessed and recommended SNF with 24-hour supervision/assistance and 3 in 1 bedside commode. Continue PT OT with assistance and fall precautions CSW consulted to assist with placement   Code Status: Full code  Family Communication: None at bedside.  Disposition Plan: SNF in the next 48 to 72 hours.  Consultants:  Neurosurgery, signed off on 05/31/2019.  PCCM  Cardiology consulted on 06/01/2019.  Interventional radiology  Procedures: 11/17: PROCEDURE: 2. Open reduction, internal fixation L1-2 Chance fracture 3. Posterior segmental instrumented pedicle screw stabilization, T11-L4 4. Posterolateral arthrodesis, T11-L4 Use of non-structural morcellized bone allograft - DBX 05/25/2019 intubation in OR per anesthesia>>extubated 11/18 Antimicrobials: Penicillin G 11/3-11/17  Significant Hospital Events   10/29 admitted for back pain  10/29 positive Covid test-asymptomatic GBS bacteremia, TEE negative for endocarditis.  Completed antibiotics on 1116 Mild exacerbation of COPD requiring diuresis 11/12 Multilevel spinal stabilization 11/17.   DVT prophylaxis: SCds  Objective: Vitals:   06/05/19 0913 06/05/19 0916 06/05/19 0937 06/05/19 1124  BP: (!) 106/43 (!) 100/49 (!) 98/51 (!) 101/49  Pulse: 73 91 77 77  Resp: (!) 23 (!) 22 (!) 21 20  Temp: 97.9 F (36.6 C) 97.9 F (36.6 C) 98.1 F (36.7 C) 98.2 F (36.8 C)  TempSrc:  Oral Oral Oral Axillary  SpO2: 97% 95% 91% 94%  Weight:      Height:        Intake/Output Summary (Last 24 hours) at 06/05/2019 1307 Last data filed at 06/05/2019 1153 Gross per 24 hour  Intake 705.5 ml  Output 3000 ml  Net -2294.5 ml   Filed Weights   06/02/19 0440 06/03/19 0347 06/04/19 0431  Weight: 114.4 kg 113.7 kg 117.2 kg    Exam:  General: 68 y.o. year-old male obese in no acute distress.  Alert and interactive.  Coretract in place. Cardiovascular: Irregular rate and rhythm no rubs or gallops.  Respiratory: Rales noted anteriorly.  No wheezing noted.  Poor inspiratory effort.   Abdomen: Bowel sounds present.  PEG tube noted.  Obese abdomen. Musculoskeletal: Trace lower extremity edema bilaterally. Psychiatry: Mood is appropriate for condition and setting.   Data Reviewed: CBC: Recent Labs  Lab 05/29/19 1511  05/31/19 0500 06/01/19 0316 06/02/19 1324 06/03/19 0345 06/03/19 2149 06/04/19 1815 06/05/19 0448  WBC 6.7   < > 3.8* 4.3  --  6.9  --  10.6* 8.9  NEUTROABS 5.6  --   --   --   --   --   --   --   --   HGB 7.9*   < > 7.8* 8.1* 8.5* 8.1* 7.4* 7.5* 6.9*  HCT 26.7*   < > 27.1* 27.9* 28.6* 27.3* 25.4* 25.7* 23.7*  MCV 93.7   < > 96.1 95.9  --  95.1  --  94.5 94.8  PLT 167   < > 197 224  --  270  --  301  285   < > = values in this interval not displayed.   Basic Metabolic Panel: Recent Labs  Lab 05/31/19 0500 06/01/19 0050 06/01/19 0316 06/02/19 0643 06/04/19 1000  NA 144 146* 146* 145 148*  K 4.8 4.7 4.7 4.3 4.0  CL 104 105 105 104 107  CO2 35* 35* 34* 32 31  GLUCOSE 193* 179* 197* 196* 158*  BUN 43* 37* 36* 29* 43*  CREATININE 0.63 0.57* 0.57* 0.60* 0.84  CALCIUM 8.3* 8.5* 8.6* 8.4* 8.3*  MG  --  2.5*  --  2.3  --   PHOS  --  3.3  --   --   --    GFR: Estimated Creatinine Clearance: 106.3 mL/min (by C-G formula based on SCr of 0.84 mg/dL). Liver Function Tests: No results for input(s): AST, ALT, ALKPHOS, BILITOT, PROT, ALBUMIN in the last  168 hours. No results for input(s): LIPASE, AMYLASE in the last 168 hours. No results for input(s): AMMONIA in the last 168 hours. Coagulation Profile: No results for input(s): INR, PROTIME in the last 168 hours. Cardiac Enzymes: No results for input(s): CKTOTAL, CKMB, CKMBINDEX, TROPONINI in the last 168 hours. BNP (last 3 results) No results for input(s): PROBNP in the last 8760 hours. HbA1C: No results for input(s): HGBA1C in the last 72 hours. CBG: Recent Labs  Lab 06/04/19 2052 06/05/19 0007 06/05/19 0420 06/05/19 0831 06/05/19 1133  GLUCAP 122* 154* 171* 215* 238*   Lipid Profile: No results for input(s): CHOL, HDL, LDLCALC, TRIG, CHOLHDL, LDLDIRECT in the last 72 hours. Thyroid Function Tests: No results for input(s): TSH, T4TOTAL, FREET4, T3FREE, THYROIDAB in the last 72 hours. Anemia Panel: No results for input(s): VITAMINB12, FOLATE, FERRITIN, TIBC, IRON, RETICCTPCT in the last 72 hours. Urine analysis:    Component Value Date/Time   COLORURINE YELLOW 05/19/2019 [place] 05/19/2019 1355   APPEARANCEUR Clear 07/23/2017   LABSPEC 1.013 05/19/2019 1355   PHURINE 5.0 05/19/2019 1355   GLUCOSEU NEGATIVE 05/19/2019 1355   GLUCOSEU 500 (A) 07/14/2017 1053   HGBUR SMALL (A) 05/19/2019 1355   BILIRUBINUR NEGATIVE 05/19/2019 1355   BILIRUBINUR Negative 07/23/2017   KETONESUR NEGATIVE 05/19/2019 1355   PROTEINUR NEGATIVE 05/19/2019 1355   UROBILINOGEN 0.2 07/14/2017 1053   NITRITE NEGATIVE 05/19/2019 1355   LEUKOCYTESUR NEGATIVE 05/19/2019 1355   Sepsis Labs: '@LABRCNTIP'$ (procalcitonin:4,lacticidven:4)  ) Recent Results (from the past 240 hour(s))  Culture, blood (routine x 2)     Status: None   Collection Time: 05/29/19  4:14 PM   Specimen: BLOOD LEFT HAND  Result Value Ref Range Status   Specimen Description BLOOD LEFT HAND  Final   Special Requests AEROBIC BOTTLE ONLY Blood Culture adequate volume  Final   Culture   Final    NO GROWTH 5  DAYS Performed at [facility], [place] [address], [place], [place] [address]    Report Status 06/03/2019 FINAL  Final  Culture, blood (routine x 2)     Status: None   Collection Time: 05/29/19  4:14 PM   Specimen: BLOOD LEFT HAND  Result Value Ref Range Status   Specimen Description BLOOD LEFT HAND  Final   Special Requests AEROBIC BOTTLE ONLY Blood Culture adequate volume  Final   Culture   Final    NO GROWTH 5 DAYS Performed at [facility], [place] [address], [place], [place] [address]    Report Status 06/03/2019 FINAL  Final  Expectorated sputum assessment w rflx to resp cult  Status: None (Preliminary result)   Collection Time: 06/03/19  9:44 PM   Specimen: Expectorated Sputum  Result Value Ref Range Status   Specimen Description EXPECTORATED SPUTUM  Final   Special Requests Normal  Final   Sputum evaluation   Final    Sputum specimen not acceptable for testing.  Please recollect.   RESULT CALLED TO, READ BACK BY AND VERIFIED WITH: H HUNT RN 06/04/19 0100 JDW Performed at [facility], [address] [place], [place] [address]    Report Status PENDING  Incomplete  Culture, blood (routine x 2)     Status: None (Preliminary result)   Collection Time: 06/03/19  9:45 PM   Specimen: BLOOD LEFT ARM  Result Value Ref Range Status   Specimen Description BLOOD LEFT ARM  Final   Special Requests   Final    BOTTLES DRAWN AEROBIC AND ANAEROBIC Blood Culture adequate volume   Culture   Final    NO GROWTH < 24 HOURS Performed at [facility], [place] [address], [place], [place] [address]    Report Status PENDING  Incomplete  Culture, blood (routine x 2)     Status: None (Preliminary result)   Collection Time: 06/03/19  9:50 PM   Specimen: BLOOD LEFT HAND  Result Value Ref Range Status   Specimen Description BLOOD LEFT HAND  Final   Special Requests AEROBIC BOTTLE ONLY Blood Culture adequate volume  Final   Culture   Final    NO GROWTH < 24 HOURS Performed at  [facility], [place] [address] [place], [place] [address]    Report Status PENDING  Incomplete  Expectorated sputum assessment w rflx to resp cult     Status: None (Preliminary result)   Collection Time: 06/04/19  3:21 AM   Specimen: SPU  Result Value Ref Range Status   Specimen Description SPUTUM  Final   Special Requests NONE  Final   Sputum evaluation   Final    MODERATE WBC PRESENT, PREDOMINANTLY PMN MODERATE GRAM POSITIVE RODS RARE GRAM POSITIVE COCCI IN PAIRS Performed at [facility], [place] [address] [place], [place] [address]    Report Status PENDING  Incomplete      Studies: Ct Abdomen Pelvis Wo Contrast  Result Date: 06/05/2019 CLINICAL DATA:  Unexplained anemia, recent lumbar surgery. EXAM: CT ABDOMEN AND PELVIS WITHOUT CONTRAST TECHNIQUE: Multidetector CT imaging of the abdomen and pelvis was performed following the standard protocol without IV contrast. COMPARISON:  CT abdomen dated 06/02/2019. FINDINGS: Lower chest: LEFT basilar pleural effusion with associated atelectasis, incompletely imaged. Mild atelectasis and/or effusion at the RIGHT lung base. Hepatobiliary: No focal liver abnormality is seen. No gallstones, gallbladder wall thickening, or biliary dilatation. Pancreas: Unremarkable. No pancreatic ductal dilatation or surrounding inflammatory changes. Spleen: Normal in size without focal abnormality. Adrenals/Urinary Tract: Adrenal glands appear normal. Probable RIGHT renal cysts, as previously described. Kidneys otherwise unremarkable without suspicious mass, stone or hydronephrosis. Bladder appears normal. Stomach/Bowel: No dilated large or small bowel loops. No evidence of bowel wall inflammation. Scattered diverticulosis but no focal inflammatory change to suggest acute diverticulitis. New gastrostomy tube in place. Vascular/Lymphatic: Aortic atherosclerosis. No enlarged lymph nodes seen in the abdomen or pelvis. Reproductive: Prostate is unremarkable. Other:  Again noted are bilateral retroperitoneal hematomas at the levels of the bilateral iliacus musculature. In the interval, this has increased in extent superiorly to the LEFT psoas musculature, measuring approximately 11 cm extent. Low-density components of the collection posterior and  anterior to the LEFT psoas musculature could represent concomitant infection/abscess. Small amount of free intraperitoneal air, presumably related to the interval gastrostomy placement. Musculoskeletal: T11-L4 paraspinal fusion. Hardware appears intact and stable in position. Again noted is the presumably postsurgical collection of fluid and air within the midline of the lower back soft tissues, stable in extent. IMPRESSION: 1. Bilateral retroperitoneal hematomas again noted at the levels of the bilateral iliacus musculature. In the interval, this has increased in extent superiorly to the LEFT psoas musculature, measuring approximately 11 cm extent at the level of the LEFT psoas. Low-density components of the collection both posterior and anterior to the LEFT psoas musculature could represent extension of hematoma or concomitant infection/abscess (febrile?). 2. Small amount of free intraperitoneal air, presumably related to the interval gastrostomy tube placement. 3. LEFT basilar pleural effusion with associated atelectasis, incompletely imaged. Mild atelectasis and/or effusion at the RIGHT lung base. 4. Stable appearance of the presumably postsurgical collection of fluid and air within the soft tissues of the midline lower back. As indicated on CT report of 06/02/2019, underlying infection cannot be excluded based on appearance alone. 5. Colonic diverticulosis without evidence of acute diverticulitis. Aortic Atherosclerosis (ICD10-I70.0). Electronically Signed   By: Franki Cabot M.D.   On: 06/05/2019 08:18   Ir Gastrostomy Tube Mod Sed  Result Date: 06/04/2019 CLINICAL DATA:  Dysphagia and need for percutaneous gastrostomy tube  for long-term nutrition. EXAM: PERCUTANEOUS GASTROSTOMY TUBE PLACEMENT ANESTHESIA/SEDATION: 1.0 mg IV Versed; 50 mcg IV Fentanyl. Total Moderate Sedation Time 10 minutes. The patient's level of consciousness and physiologic status were continuously monitored during the procedure by Radiology nursing. CONTRAST:  45m OMNIPAQUE IOHEXOL 300 MG/ML  SOLN MEDICATIONS: The patient is receiving multiple IV antibiotics and additional antibiotics were not administered for the procedure. FLUOROSCOPY TIME:  1 minutes and 30 seconds.  111 mGy. PROCEDURE: The procedure, risks, benefits, and alternatives were explained to the patient's daughter. Questions regarding the procedure were encouraged and answered. The patient's daughter understands and consents to the procedure. A time-out was performed prior to initiating the procedure. A 5-French catheter was then advanced through the patient's mouth under fluoroscopy into the esophagus and to the level of the stomach. This catheter was used to insufflate the stomach with air under fluoroscopy. The abdominal wall was prepped with chlorhexidine in a sterile fashion, and a sterile drape was applied covering the operative field. A sterile gown and sterile gloves were used for the procedure. Local anesthesia was provided with 1% Lidocaine. A skin incision was made in the upper abdominal wall. Under fluoroscopy, an 18 gauge trocar needle was advanced into the stomach. Contrast injection was performed to confirm intraluminal position of the needle tip. A single T tack was then deployed in the lumen of the stomach. This was brought up to tension at the skin surface. Over a guidewire, a 9-French sheath was advanced into the lumen of the stomach. The wire was left in place as a safety wire. A loop snare device from a percutaneous gastrostomy kit was then advanced into the stomach. A floppy guide wire was advanced through the orogastric catheter under fluoroscopy in the stomach. The loop snare  advanced through the percutaneous gastric access was used to snare the guide wire. This allowed withdrawal of the loop snare out of the patient's mouth by retraction of the orogastric catheter and wire. A 20-French bumper retention gastrostomy tube was looped around the snare device. It was then pulled back through the patient's mouth. The retention bumper  was brought up to the anterior gastric wall. The T tack suture was cut at the skin. The exiting gastrostomy tube was cut to appropriate length and a feeding adapter applied. The catheter was injected with contrast material to confirm position and a fluoroscopic spot image saved. The tube was then flushed with saline. A dressing was applied over the gastrostomy exit site. COMPLICATIONS: None. FINDINGS: The stomach distended well with air allowing safe placement of the gastrostomy tube. After placement, the tip of the gastrostomy tube lies in the body of the stomach. IMPRESSION: Percutaneous gastrostomy with placement of a 20-French bumper retention tube in the body of the stomach. This tube can be used for percutaneous feeds beginning in 24 hours after placement. Electronically Signed   By: Aletta Edouard M.D.   On: 06/04/2019 17:31    Scheduled Meds:  ascorbic acid  500 mg Per Tube Daily   bethanechol  10 mg Per Tube BID   [START ON 06/06/2019] carvedilol  3.125 mg Per Tube BID WC   chlorhexidine  15 mL Mouth Rinse BID   Chlorhexidine Gluconate Cloth  6 each Topical Daily   [START ON 06/06/2019] feeding supplement (PRO-STAT SUGAR FREE 64)  30 mL Per Tube BID   fluticasone  2 spray Each Nare Daily   fluticasone furoate-vilanterol  1 puff Inhalation Daily   free water  200 mL Per Tube Q4H   [START ON 06/06/2019] furosemide  20 mg Intravenous Daily   insulin aspart  0-20 Units Subcutaneous Q4H   insulin aspart  4 Units Subcutaneous TID WC   insulin detemir  40 Units Subcutaneous BID   liver oil-zinc oxide   Topical QID   loratadine   10 mg Per Tube QPM   [START ON 06/06/2019] losartan  12.5 mg Per Tube Daily   mouth rinse  15 mL Mouth Rinse q12n4p   multivitamin with minerals  1 tablet Per Tube Daily   pantoprazole sodium  40 mg Per Tube Daily   simvastatin  20 mg Per Tube q morning - 10a   sodium chloride flush  10-40 mL Intracatheter Q12H   sodium chloride flush  3 mL Intravenous Q12H   [START ON 06/06/2019] torsemide  20 mg Per Tube q1800   umeclidinium bromide  1 puff Inhalation Daily   Vilazodone HCl  40 mg Oral Daily    Continuous Infusions:  sodium chloride 250 mL (06/03/19 2141)   ceFEPime (MAXIPIME) IV 2 g (06/05/19 0530)   feeding supplement (OSMOLITE 1.5 CAL)     vancomycin 2,000 mg (06/04/19 2159)     LOS: 30 days     Kayleen Memos, MD [facility] Pager [phone]  If 7PM-7AM, please contact night-coverage [email or web address] Password [email or web address] 06/05/2019, 1:07 PM

## 2019-06-05 NOTE — Progress Notes (Signed)
Physical Therapy Treatment Patient Details Name: Cody Hale MRN: [id number] DOB: 01/27/1951 Today's Date: 06/05/2019    History of Present Illness Pt is a 68 yo male presented 05/06/19 to ED after fall  (he tripped over some blankets), onto his buttocks with severe back pain;  CT lumbar spine showing unstable fracture L1-2 and likely ankylosing spondylitis; tested +COVID and developed fever and required TEE to r/o endocarditis prior to surgery;  11/17 underwent open reduction internal fixation of L1-L2 Chance fracture with multilevel spine stabilization from T11-L4. Remained intubated until 07/25/18 PMHx: Afib, MVR, SVT, anxiety, CAD, COPD, CHF, HTN,     PT Comments    Pt was more focused and participative.  He was able to tolerate the pain better and stay on task.  Emphasis on warm up, transition to sit, sit to stand in RW and with 2 person assist, transfer to chair.    Follow Up Recommendations  SNF(if progressing, consider CIR)     Equipment Recommendations  Other (comment)(TBD)    Recommendations for Other Services       Precautions / Restrictions Precautions Precautions: Back;Fall Precaution Booklet Issued: No Required Braces or Orthoses: Spinal Brace Spinal Brace: Thoracolumbosacral orthotic;Applied in sitting position(*order is to don in sitting) Restrictions Weight Bearing Restrictions: No    Mobility  Bed Mobility Overal bed mobility: Needs Assistance Bed Mobility: Rolling;Sidelying to Sit;Sit to Supine Rolling: Max assist;+2 for physical assistance Sidelying to sit: +2 for physical assistance;+2 for safety/equipment;Mod assist;Max assist       General bed mobility comments: cued for technique,  assisted role and hold at right side for dressiing change.  Truncal assist for transition up vis R UE..  Transfers Overall transfer level: Needs assistance Equipment used: Rolling walker (2 wheeled) Transfers: Systems analyst;Sit to/from Stand Sit to Stand: Max  assist;+2 safety/equipment   Squat pivot transfers: Max assist;+2 safety/equipment     General transfer comment: Stood in the RW for the first time and was able to attain submaximal stance bilaterally with flexed knees x2.  Used bed pad to help pt stand and pivot to the chair with significantly more pt help today.  Ambulation/Gait             General Gait Details: unable   Stairs             Wheelchair Mobility    Modified Rankin (Stroke Patients Only)       Balance Overall balance assessment: Needs assistance Sitting-balance support: Bilateral upper extremity supported;Feet supported;Single extremity supported Sitting balance-Leahy Scale: Poor Sitting balance - Comments: pt needing external support for sitting EOB, still tending to list posteriorly if has no assist                                    Cognition Arousal/Alertness: Awake/alert Behavior During Therapy: Flat affect Overall Cognitive Status: Impaired/Different from baseline Area of Impairment: Attention;Following commands;Problem solving                   Current Attention Level: Sustained   Following Commands: Follows one step commands with increased time     Problem Solving: Slow processing;Decreased initiation;Requires verbal cues;Requires tactile cues;Difficulty sequencing        Exercises General Exercises - Lower Extremity Heel Slides: (resisted extension RLE stronger than LLE) Other Exercises Other Exercises: AAROM bil LE in hip/knee flex/ext and bil hip add/IR with stretch for better positioning at EOB  General Comments        Pertinent Vitals/Pain Pain Assessment: Faces Faces Pain Scale: Hurts little more Pain Location: back Pain Descriptors / Indicators: Discomfort;Grimacing;Moaning    Home Living                      Prior Function            PT Goals (current goals can now be found in the care plan section) Acute Rehab PT  Goals Patient Stated Goal: to get comfortable PT Goal Formulation: With patient Time For Goal Achievement: 06/09/19 Potential to Achieve Goals: Fair Progress towards PT goals: Progressing toward goals    Frequency    Min 5X/week      PT Plan Current plan remains appropriate    Co-evaluation              AM-PAC PT "6 Clicks" Mobility   Outcome Measure  Help needed turning from your back to your side while in a flat bed without using bedrails?: A Lot Help needed moving from lying on your back to sitting on the side of a flat bed without using bedrails?: A Lot Help needed moving to and from a bed to a chair (including a wheelchair)?: Total Help needed standing up from a chair using your arms (e.g., wheelchair or bedside chair)?: Total Help needed to walk in hospital room?: Total Help needed climbing 3-5 steps with a railing? : Total 6 Click Score: 8    End of Session Equipment Utilized During Treatment: Back brace Activity Tolerance: Treatment limited secondary to medical complications (Comment);Patient limited by pain(incr pain LLE) Patient left: in chair;with call bell/phone within reach;with chair alarm set(on maxi move pad) Nurse Communication: Mobility status;Need for lift equipment PT Visit Diagnosis: Other abnormalities of gait and mobility (R26.89);Muscle weakness (generalized) (M62.81);Pain;History of falling (Z91.81) Pain - Right/Left: (bil L>R) Pain - part of body: Hip;Leg(legs, knees, back)     Time: 3818-2993 PT Time Calculation (min) (ACUTE ONLY): 26 min  Charges:  $Therapeutic Activity: 23-37 mins                     06/05/2019  Donnella Sham, PT Acute Rehabilitation Services [phone]  (pager) [phone]  (office)   Tessie Fass Mazi Brailsford 06/05/2019, 3:18 PM

## 2019-06-06 DIAGNOSIS — R7881 Bacteremia: Secondary | ICD-10-CM | POA: Diagnosis not present

## 2019-06-06 LAB — GLUCOSE, CAPILLARY
Glucose-Capillary: 133 mg/dL — ABNORMAL HIGH (ref 70–99)
Glucose-Capillary: 220 mg/dL — ABNORMAL HIGH (ref 70–99)
Glucose-Capillary: 240 mg/dL — ABNORMAL HIGH (ref 70–99)
Glucose-Capillary: 242 mg/dL — ABNORMAL HIGH (ref 70–99)
Glucose-Capillary: 248 mg/dL — ABNORMAL HIGH (ref 70–99)
Glucose-Capillary: 92 mg/dL (ref 70–99)

## 2019-06-06 LAB — CBC
HCT: 26.5 % — ABNORMAL LOW (ref 39.0–52.0)
Hemoglobin: 7.7 g/dL — ABNORMAL LOW (ref 13.0–17.0)
MCH: 27.9 pg (ref 26.0–34.0)
MCHC: 29.1 g/dL — ABNORMAL LOW (ref 30.0–36.0)
MCV: 96 fL (ref 80.0–100.0)
Platelets: 277 10*3/uL (ref 150–400)
RBC: 2.76 MIL/uL — ABNORMAL LOW (ref 4.22–5.81)
RDW: 19.3 % — ABNORMAL HIGH (ref 11.5–15.5)
WBC: 10.3 10*3/uL (ref 4.0–10.5)
nRBC: 0.7 % — ABNORMAL HIGH (ref 0.0–0.2)

## 2019-06-06 LAB — VANCOMYCIN, PEAK: Vancomycin Pk: 39 ug/mL (ref 30–40)

## 2019-06-06 LAB — PREPARE RBC (CROSSMATCH)

## 2019-06-06 LAB — HEMOGLOBIN AND HEMATOCRIT, BLOOD
HCT: 28.8 % — ABNORMAL LOW (ref 39.0–52.0)
Hemoglobin: 8.7 g/dL — ABNORMAL LOW (ref 13.0–17.0)

## 2019-06-06 MED ORDER — TAMSULOSIN HCL 0.4 MG PO CAPS
0.4000 mg | ORAL_CAPSULE | Freq: Every day | ORAL | Status: DC
  Administered 2019-06-07 – 2019-06-18 (×12): 0.4 mg via ORAL
  Filled 2019-06-06 (×13): qty 1

## 2019-06-06 MED ORDER — SODIUM CHLORIDE 0.9% IV SOLUTION: Freq: Once | INTRAVENOUS | Status: DC

## 2019-06-06 MED ORDER — CARVEDILOL 3.125 MG PO TABS
3.1250 mg | ORAL_TABLET | Freq: Two times a day (BID) | ORAL | Status: DC
  Administered 2019-06-07 – 2019-06-18 (×23): 3.125 mg
  Filled 2019-06-06 (×22): qty 1

## 2019-06-06 MED ORDER — VANCOMYCIN HCL 10 G IV SOLR
1500.0000 mg | INTRAVENOUS | Status: AC
  Administered 2019-06-07 – 2019-06-09 (×4): 1500 mg via INTRAVENOUS
  Filled 2019-06-06 (×5): qty 1500

## 2019-06-06 MED ORDER — LOSARTAN POTASSIUM 25 MG PO TABS
12.5000 mg | ORAL_TABLET | Freq: Every day | ORAL | Status: DC
  Administered 2019-06-07 – 2019-06-09 (×3): 12.5 mg
  Filled 2019-06-06 (×4): qty 0.5

## 2019-06-06 MED ORDER — TORSEMIDE 20 MG PO TABS
20.0000 mg | ORAL_TABLET | Freq: Every day | ORAL | Status: DC
  Administered 2019-06-07 – 2019-06-17 (×11): 20 mg
  Filled 2019-06-06 (×11): qty 1

## 2019-06-06 NOTE — Progress Notes (Addendum)
PROGRESS NOTE  Cody Hale WGN:[id number] DOB: 1950-08-10 DOA: 05/06/2019 PCP: Etta GrandchildJones, Thomas L, MD  HPI/Recap of past 24 hours: Cody SitesCarlis Aguinaldo is a 68 y.o. 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  Patient fell onto his buttock.  Started hurting and was brought to the ER.  Denies any incontinence of urine or bowel.  Patient's pain has been significant.  ED Course: In the ER at [facility] MRI showed unstable lumbar fracture and patient was [facility].  Patient had Covid test done which was positive.  Patient otherwise is denying any shortness of breath has been a some productive cough chest x-ray was showing cardiomegaly and nothing acute.  Other labs show BNP of 326 LDH 331 CRP 14.7 WBC 11.9 hemoglobin 13.8 platelets 104 creatinine 1.3.  Neurosurgery has been consulted patient is being admitted for further management of lumbar fracture with COVID-19 being positive.     Brief interval history:Patient with a history of atrial fibrillation on chronic anticoagulation with Xarelto, CAD, history of endocarditis, left bundle branch block, history of mitral valve replacement, chronic systolic heart failure, SVT, COPD, chronic venous insufficiency, and hypertension who presented on 10/29 after a fall.  He was found to have an unstable fracture of his lumbar spine but was also found to be positive for (asymptomatic) COVID-19 infection.   While waiting for surgical clearance (increased risk of complications in the setting of surgery with COVID infection) and developed GBS bacteremia, now off antibiotics.  He had multilevel spinal fusion on 11/17.  He was difficult to ventilate in the OR and so kept intubated overnight.  Patient was previously on the hospitalist service.   Currently on RA.  He  is quite deconditioned due to prolonged hospitalization.  He will need aggressive pulmonary toilet.  PT is seeing him.  Neurosurgery is following and he has a drain in place, but he is stable from this standpoint.  He is likely to need rehab - CIR vs. SNF, likely too early to tell which.  TRH will resume care of the patient on 11/21.  11/21: Nonproductive cough and back pain. 11/23: Right hip pain.   11/24: Bedside RN reports 30 beats of V. tach overnight, asymptomatic.  Twelve-lead EKG showed A. fib with frequent PVCs.  Last TEE done on 05/20/2019 showed LVEF 40 to 45%.  Cardiology consulted.  11/25: Discussed with IR for possible peg tube placement on 06/04/19. CT abd pelvis done to look at the structure showed incidentally found retroperitoneal hematomas affecting b/l iliacus musculature similar in hind site to lumbar spine CT performed 05/25/2019. Correlation with hematocrit levels is advised. Will hold hep drip until repeat H&H results. Hg trend up 11/26: Plan for PEG tube placement 06/04/2019 by interventional radiology.  11/27: POD #0 post peg tube placement by interventional radiology on 06/04/19.  11/28: Hemoglobin dropped this morning, 6.9.  Stopped IV heparin.  1 unit PRBC ordered to be transfused.  Stat CT abdomen and pelvis showed enlarging bilateral retroperitoneal hematomas.  Cardiology team PA Barrett paged as re-consult and made aware.    06/06/19: Patient was seen and examined at his bedside this morning.  At the time of this visit he was no complaining of abdominal pain however he received pain medication earlier this morning due to lower abdominal pain.  Seen by general surgery, no  procedures planned, not indicated at this time.  Patient scheduled for staples removal 1 week from 11/23 per neurosurgery, Dr. Luanna Salk note.  Neurosurgery has been curb-sided regarding hematoma..  Of note, per radiology report done on 06/02/19, bilateral retroperitoneal hematomas involving iliacus  muscles were present prior to neurosurgery on 05/22/19.  Assessment/Plan: Active Problems:   OSA (obstructive sleep apnea)   Type II diabetes mellitus with manifestations (HCC)   Chronic systolic CHF (congestive heart failure) (HCC)   Hyperlipidemia with target LDL less than 70   Chronic atrial fibrillation   Chronic respiratory failure with hypoxia (HCC)   Unstable burst fracture of unspecified lumbar vertebra, initial encounter for closed fracture (HCC)   Closed unstable burst fracture of lumbar vertebra (HCC)   Closed unstable burst fracture of first lumbar vertebra (HCC)   Pressure injury of skin   Bacteremia   Hyponatremia   NSVT (nonsustained ventricular tachycardia) (HCC)  POD #13 post T11-L4 fusion for L1 Chance fracture. Seen by neurosurgery, no new recommendations, signed off on 05/31/2019. Pain management and bowel regimen as needed.  Incidentally found retroperitoneal Hematomas affecting iliacus musculature bilaterally, first noted per radiology report on 05/22/19 (on report it is dated 05/25/19 but no CT was done at that date) Found incidentally from CT abd/pelvis ordered to evaluate abdominal structure prior to peg tube placement. Off heparin drip Chronic Afib with Chadsvasc 4. Ok to keep off anticoagulation per cardiology. Hg drop 7.7 from 8.2 yesterday Will transfuse to keep hg >8.0 Seen by general surgery, surgical intervention not indicated at this time. Contacted neurosurgery to further assess 06/06/19. Neurosurgical intervention not indicated. Continue to hold off anticoagulation and continue to follow H&H  Dysphagia post PEG tube placement by IR on 06/04/2019 Continue PEG tube feeding Continue aspiration precautions Continue frequent suctioning by RT Coretract removed on 06/05/2019.  Acute hypoxic respiratory failure possible silent aspiration Continue RT suctioning Continue to maintain O2 saturation greater than 92% Continue aspiration precautions  Continue bronchodilators and IV Lasix  Essential hypertension with soft blood pressures Continue to hold off blood pressure medications Currently on Lasix 20 mg daily Maintain MAP greater than 65  Resolved fever with T-max 102 on 06/03/2019 on IV antibiotics Chest x-ray obtained, findings as stated above Sputum culture and blood cultures obtained Sputum culture shows moderate gram-positive rods and rare gram positive cocci in pairs. Blood cultures negative to date Continue to follow cultures Continue empiric antibiotics  Post acute hypoxic hypercarbic respiratory failure requiring invasive mechanical ventilation likely secondary to COVID-19 pneumonia versus severe COPD exacerbation Not On oxygen supplementation at baseline Extubated on 05/26/2019 Currently on room air 96%.  Nonsustained V. Tach Resolved  Acute urinary retention Indwelling Foley catheter placed on 05/29/2019, removed on 06/01/2019. Bethanechol started on 05/30/2019, continue DC bethanechol Start tamsulosin  Chronic A. Fib Continue to hold off Coreg due to soft blood pressures Continue to hold off anticoagulation due to retroperitoneal hematoma  Streptococcous Group G bacteremia, poa Blood cx x 2 positive on 05/10/19. Repeat blood cx x2 done on 05/11/19 staph coag neg x1 (likely contaminant) and other bottle negative growth final. Treated.  Completed course of treatment Blood cultures drawn on 05/29/2019 due to concern for fever negative today. Continue to follow cultures  Treated COVID-19 viral pneumonia  Treated with 5 days of remdesivir Continue bronchodilators and Lasix  COPD exacerbation Continue bronchodialators  Chronic systolic CHF Last 2D echo showed LVEF 40 to 45% on 05/20/2019. Continue strict I's and O's and daily weight Following medications on hold:  Coreg 3.125 mg twice daily, losartan 12.5 mg daily,Torsemide 20 mg daily  GERD Continue PPI  Hyperlipidemia Continue statin  Physical  debility/ambulatory dysfunction PT assessment recommended to continue improvement toward CIR. OT assessed and recommended SNF with 24-hour supervision/assistance and 3 in 1 bedside commode. Continue PT OT with assistance and fall precautions CSW consulted to assist with placement   Code Status: Full code  Family Communication: None at bedside.  Disposition Plan: SNF in the next 48 to 72 hours.  Consultants:  Neurosurgery, signed off on 05/31/2019.  PCCM  Cardiology consulted on 06/01/2019.  Interventional radiology  Procedures: 11/17: PROCEDURE: 2. Open reduction, internal fixation L1-2 Chance fracture 3. Posterior segmental instrumented pedicle screw stabilization, T11-L4 4. Posterolateral arthrodesis, T11-L4 Use of non-structural morcellized bone allograft - DBX 05/25/2019 intubation in OR per anesthesia>>extubated 11/18 PEG tube placement on 06/04/2019 by interventional radiology.  Antimicrobials: Penicillin G 11/3-11/17 Cefepime 06/03/2019>> IV vancomycin 06/03/2019>>  Significant Hospital Events   10/29 admitted for back pain  10/29 positive Covid test-asymptomatic GBS bacteremia, TEE negative for endocarditis.  Completed antibiotics on 1116 Mild exacerbation of COPD requiring diuresis 11/12 Multilevel spinal stabilization 11/17.   DVT prophylaxis: SCds  Objective: Vitals:   06/06/19 0715 06/06/19 0807 06/06/19 0808 06/06/19 1001  BP: (!) 115/54  (!) 101/52 (!) 113/56  Pulse: 94  94   Resp: (!) 22     Temp: 97.8 F (36.6 C)  99.3 F (37.4 C) 97.8 F (36.6 C)  TempSrc: Oral  Oral Oral  SpO2: 96% 96% 96%   Weight:      Height:        Intake/Output Summary (Last 24 hours) at 06/06/2019 1019 Last data filed at 06/06/2019 1001 Gross per 24 hour  Intake 791.5 ml  Output 1500 ml  Net -708.5 ml   Filed Weights   06/02/19 0440 06/03/19 0347 06/04/19 0431  Weight: 114.4 kg 113.7 kg 117.2 kg    Exam:  General: 68 y.o. year-old male obese in no  acute distress.  Alert and very talkative.  No acute distress.  Alert and oriented x1. Cardiovascular: Irregular rate and rhythm no rubs or gallops.  Respiratory: Improved rales bilaterally.  No wheezing noted.   Abdomen: Bowel sounds present PEG tube in place.. Musculoskeletal: Trace lower extremity edema bilaterally. Psychiatry: Mood is appropriate for condition and setting.   Data Reviewed: CBC: Recent Labs  Lab 06/01/19 0316  06/03/19 0345 06/03/19 2149 06/04/19 1815 06/05/19 0448 06/05/19 2050 06/06/19 0335  WBC 4.3  --  6.9  --  10.6* 8.9  --  10.3  HGB 8.1*   < > 8.1* 7.4* 7.5* 6.9* 8.3* 7.7*  HCT 27.9*   < > 27.3* 25.4* 25.7* 23.7* 28.2* 26.5*  MCV 95.9  --  95.1  --  94.5 94.8  --  96.0  PLT 224  --  270  --  301 285  --  277   < > = values in this interval not displayed.   Basic Metabolic Panel: Recent Labs  Lab 05/31/19 0500 06/01/19 0050 06/01/19 0316 06/02/19 0643 06/04/19 1000  NA 144 146* 146* 145 148*  K 4.8 4.7 4.7 4.3 4.0  CL 104 105 105 104 107  CO2 35* 35* 34* 32 31  GLUCOSE 193* 179* 197* 196* 158*  BUN 43* 37* 36* 29* 43*  CREATININE 0.63 0.57* 0.57* 0.60* 0.84  CALCIUM 8.3* 8.5* 8.6* 8.4* 8.3*  MG  --  2.5*  --  2.3  --   PHOS  --  3.3  --   --   --    GFR: Estimated Creatinine Clearance: 106.3 mL/min (by C-G formula based on SCr of 0.84 mg/dL). Liver Function Tests: No results for input(s): AST, ALT, ALKPHOS, BILITOT, PROT, ALBUMIN in the last 168 hours. No results for input(s): LIPASE, AMYLASE in the last 168 hours. No results for input(s): AMMONIA in the last 168 hours. Coagulation Profile: No results for input(s): INR, PROTIME in the last 168 hours. Cardiac Enzymes: No results for input(s): CKTOTAL, CKMB, CKMBINDEX, TROPONINI in the last 168 hours. BNP (last 3 results) No results for input(s): PROBNP in the last 8760 hours. HbA1C: No results for input(s): HGBA1C in the last 72 hours. CBG: Recent Labs  Lab 06/05/19 1655 06/05/19  2005 06/06/19 0007 06/06/19 0440 06/06/19 0748  GLUCAP 124* 80 92 133* 242*   Lipid Profile: No results for input(s): CHOL, HDL, LDLCALC, TRIG, CHOLHDL, LDLDIRECT in the last 72 hours. Thyroid Function Tests: No results for input(s): TSH, T4TOTAL, FREET4, T3FREE, THYROIDAB in the last 72 hours. Anemia Panel: No results for input(s): VITAMINB12, FOLATE, FERRITIN, TIBC, IRON, RETICCTPCT in the last 72 hours. Urine analysis:    Component Value Date/Time   COLORURINE YELLOW 05/19/2019 [place] 05/19/2019 1355   APPEARANCEUR Clear 07/23/2017   LABSPEC 1.013 05/19/2019 1355   PHURINE 5.0 05/19/2019 1355   GLUCOSEU NEGATIVE 05/19/2019 1355   GLUCOSEU 500 (A) 07/14/2017 1053   HGBUR SMALL (A) 05/19/2019 1355   BILIRUBINUR NEGATIVE 05/19/2019 1355   BILIRUBINUR Negative 07/23/2017   KETONESUR NEGATIVE 05/19/2019 1355   PROTEINUR NEGATIVE 05/19/2019 1355   UROBILINOGEN 0.2 07/14/2017 1053   NITRITE NEGATIVE 05/19/2019 1355   LEUKOCYTESUR NEGATIVE 05/19/2019 1355   Sepsis Labs: @LABRCNTIP (procalcitonin:4,lacticidven:4)  ) Recent Results (from the past 240 hour(s))  Culture, blood (routine x 2)     Status: None   Collection Time: 05/29/19  4:14 PM   Specimen: BLOOD LEFT HAND  Result Value Ref Range Status   Specimen Description BLOOD LEFT HAND  Final   Special Requests AEROBIC BOTTLE ONLY Blood Culture adequate volume  Final   Culture   Final    NO GROWTH 5 DAYS Performed at [facility], [place] [address], [place], [place] [address]    Report Status 06/03/2019 FINAL  Final  Culture, blood (routine x 2)     Status: None   Collection Time: 05/29/19  4:14 PM   Specimen: BLOOD LEFT HAND  Result Value Ref Range Status   Specimen Description BLOOD LEFT HAND  Final   Special Requests AEROBIC BOTTLE ONLY Blood Culture adequate volume  Final   Culture   Final    NO GROWTH 5 DAYS Performed at [facility], [place] [address] [place], [place] [address]     Report Status 06/03/2019 FINAL  Final  Expectorated sputum assessment w rflx to resp cult     Status: None   Collection Time: 06/03/19  9:44 PM   Specimen: Expectorated Sputum  Result Value Ref Range Status   Specimen Description EXPECTORATED SPUTUM  Final   Special Requests Normal  Final   Sputum evaluation   Final    Sputum specimen not acceptable for testing.  Please recollect.   RESULT CALLED TO, READ BACK BY AND VERIFIED WITH: H HUNT RN 06/04/19 0100 JDW Performed at [facility], [address], [place], [place] [address]    Report Status 06/05/2019 FINAL  Final  Culture, blood (routine x 2)  Status: None (Preliminary result)   Collection Time: 06/03/19  9:45 PM   Specimen: BLOOD LEFT ARM  Result Value Ref Range Status   Specimen Description BLOOD LEFT ARM  Final   Special Requests   Final    BOTTLES DRAWN AEROBIC AND ANAEROBIC Blood Culture adequate volume   Culture   Final    NO GROWTH 3 DAYS Performed at [facility], [address], [place], [place] [address]    Report Status PENDING  Incomplete  Culture, blood (routine x 2)     Status: None (Preliminary result)   Collection Time: 06/03/19  9:50 PM   Specimen: BLOOD LEFT HAND  Result Value Ref Range Status   Specimen Description BLOOD LEFT HAND  Final   Special Requests AEROBIC BOTTLE ONLY Blood Culture adequate volume  Final   Culture   Final    NO GROWTH 3 DAYS Performed at [facility], [address], [place], [place] [address]    Report Status PENDING  Incomplete  Expectorated sputum assessment w rflx to resp cult     Status: None   Collection Time: 06/04/19  3:21 AM   Specimen: SPU  Result Value Ref Range Status   Specimen Description SPUTUM  Final   Special Requests NONE  Final   Sputum evaluation   Final    THIS SPECIMEN IS ACCEPTABLE FOR SPUTUM CULTURE Performed at [facility], [address] [place], [place] [address]    Report Status 06/05/2019 FINAL  Final  Culture,  respiratory     Status: None (Preliminary result)   Collection Time: 06/04/19  3:21 AM   Specimen: SPU  Result Value Ref Range Status   Specimen Description SPUTUM  Final   Special Requests NONE Reflexed from [id number]  Final   Gram Stain   Final    ABUNDANT WBC PRESENT, PREDOMINANTLY PMN MODERATE GRAM POSITIVE RODS Performed at [facility], [address], [place], [place] [address]    Culture PENDING  Incomplete   Report Status PENDING  Incomplete      Studies: No results found.  Scheduled Meds: . sodium chloride   Intravenous Once  . ascorbic acid  500 mg Per Tube Daily  . bethanechol  10 mg Per Tube BID  . [START ON 06/07/2019] carvedilol  3.125 mg Per Tube BID WC  . chlorhexidine  15 mL Mouth Rinse BID  . Chlorhexidine Gluconate Cloth  6 each Topical Daily  . feeding supplement (PRO-STAT SUGAR FREE 64)  30 mL Per Tube BID  . fluticasone  2 spray Each Nare Daily  . fluticasone furoate-vilanterol  1 puff Inhalation Daily  . free water  200 mL Per Tube Q4H  . furosemide  20 mg Intravenous Daily  . insulin aspart  0-20 Units Subcutaneous Q4H  . liver oil-zinc oxide   Topical QID  . loratadine  10 mg Per Tube QPM  . [START ON 06/07/2019] losartan  12.5 mg Per Tube Daily  . mouth rinse  15 mL Mouth Rinse q12n4p  . multivitamin with minerals  1 tablet Per Tube Daily  . pantoprazole sodium  40 mg Per Tube Daily  . simvastatin  20 mg Per Tube q morning - 10a  . sodium chloride flush  10-40 mL Intracatheter Q12H  . sodium chloride flush  3 mL Intravenous Q12H  . [START ON 06/07/2019] torsemide  20 mg Per Tube q1800  . umeclidinium bromide  1 puff Inhalation Daily  .  Vilazodone HCl  40 mg Oral Daily    Continuous Infusions: . sodium chloride 250 mL (06/03/19 2141)  . ceFEPime (MAXIPIME) IV 2 g (06/06/19 0541)  . feeding supplement (OSMOLITE 1.5 CAL) 1,000 mL (06/05/19 2107)  . vancomycin 2,000 mg (06/05/19 2306)     LOS: 31 days     Darlin Drop, MD [facility] Pager [phone]  If 7PM-7AM, please contact night-coverage [email or web address] Password [facility] 06/06/2019, 10:19 AM
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
## 2019-06-06 NOTE — Progress Notes (Signed)
Pharmacy Antibiotic Note  Cody Hale is a 68 y.o. male admitted on 05/06/2019 for lumbar fracture. Tm 102 tonight WBC wnl broaden antibiotics and recheck cultures.  Pharmacy has been consulted for Cefepime and vancomycin  dosing.  Vanc levels at steady state on 2 gms IV q24hr where as follows: Vanc trough @2247  was 15 mcg/ml Vanc peak @0300  was 39 mcg/ml  This yeilds an supratherapeutic AUC of 639  Will change to Vancomycin 1500 mg IV q24hr for an AUC of 479  Plan: Continue Cefepime 2gm IV q8h Change Vancomycin to 1500 mg IV q24h Monitor renal function, clinical status, C&S and vanc levels as needed   Height: 5\' 9"  (175.3 cm) Weight: 258 lb 6.1 oz (117.2 kg) IBW/kg (Calculated) : 70.7  Temp (24hrs), Avg:98 F (36.7 C), Min:97.6 F (36.4 C), Max:99.3 F (37.4 C)  Recent Labs  Lab 05/31/19 0500 06/01/19 0050 06/01/19 0316 06/02/19 0643 06/03/19 0345 06/04/19 1000 06/04/19 1815 06/05/19 0448 06/05/19 2130 06/06/19 0300 06/06/19 0335  WBC 3.8*  --  4.3  --  6.9  --  10.6* 8.9  --   --  10.3  CREATININE 0.63 0.57* 0.57* 0.60*  --  0.84  --   --   --   --   --   VANCOTROUGH  --   --   --   --   --   --   --   --  15  --   --   VANCOPEAK  --   --   --   --   --   --   --   --   --  39  --     Estimated Creatinine Clearance: 106.3 mL/min (by C-G formula based on SCr of 0.84 mg/dL).    Allergies  Allergen Reactions  . Lisinopril Cough  . Daptomycin Rash  . Metformin And Related Diarrhea  . Tape Rash and Other (See Comments)    Adhesive Tape-Burn skin.    Antimicrobials this admission:  Vanc 11/26>  Cefepime 11/26> CTX 11/3>>11/4 Pen G 11/4 >> 11/16 Remdesivir 10/30 >> 11/3  Alanda Slim, PharmD, [facility] Clinical Pharmacist Please see AMION for all Pharmacists' Contact Phone Numbers 06/06/2019, 11:01 AM

## 2019-06-07 ENCOUNTER — Inpatient Hospital Stay (HOSPITAL_COMMUNITY): Payer: Medicare Other

## 2019-06-07 DIAGNOSIS — I482 Chronic atrial fibrillation, unspecified: Secondary | ICD-10-CM | POA: Diagnosis not present

## 2019-06-07 DIAGNOSIS — I5022 Chronic systolic (congestive) heart failure: Secondary | ICD-10-CM | POA: Diagnosis not present

## 2019-06-07 DIAGNOSIS — I472 Ventricular tachycardia: Secondary | ICD-10-CM | POA: Diagnosis not present

## 2019-06-07 LAB — CBC
HCT: 30 % — ABNORMAL LOW (ref 39.0–52.0)
Hemoglobin: 8.8 g/dL — ABNORMAL LOW (ref 13.0–17.0)
MCH: 28 pg (ref 26.0–34.0)
MCHC: 29.3 g/dL — ABNORMAL LOW (ref 30.0–36.0)
MCV: 95.5 fL (ref 80.0–100.0)
Platelets: 234 10*3/uL (ref 150–400)
RBC: 3.14 MIL/uL — ABNORMAL LOW (ref 4.22–5.81)
RDW: 18.7 % — ABNORMAL HIGH (ref 11.5–15.5)
WBC: 10 10*3/uL (ref 4.0–10.5)
nRBC: 0.6 % — ABNORMAL HIGH (ref 0.0–0.2)

## 2019-06-07 LAB — TYPE AND SCREEN
ABO/RH(D): O POS
Antibody Screen: NEGATIVE
Unit division: 0
Unit division: 0

## 2019-06-07 LAB — GLUCOSE, CAPILLARY
Glucose-Capillary: 246 mg/dL — ABNORMAL HIGH (ref 70–99)
Glucose-Capillary: 260 mg/dL — ABNORMAL HIGH (ref 70–99)
Glucose-Capillary: 273 mg/dL — ABNORMAL HIGH (ref 70–99)
Glucose-Capillary: 265 mg/dL — ABNORMAL HIGH (ref 70–99)
Glucose-Capillary: 272 mg/dL — ABNORMAL HIGH (ref 70–99)
Glucose-Capillary: 319 mg/dL — ABNORMAL HIGH (ref 70–99)
Glucose-Capillary: 274 mg/dL — ABNORMAL HIGH (ref 70–99)

## 2019-06-07 LAB — TROPONIN I (HIGH SENSITIVITY)
Troponin I (High Sensitivity): 32 ng/L — ABNORMAL HIGH (ref <18)
Troponin I (High Sensitivity): 34 ng/L — ABNORMAL HIGH (ref <18)
Troponin I (High Sensitivity): 36 ng/L — ABNORMAL HIGH (ref <18)

## 2019-06-07 LAB — BASIC METABOLIC PANEL
Anion gap: 11 (ref 5–15)
BUN: 28 mg/dL — ABNORMAL HIGH (ref 8–23)
CO2: 27 mmol/L (ref 22–32)
Calcium: 8.3 mg/dL — ABNORMAL LOW (ref 8.9–10.3)
Chloride: 110 mmol/L (ref 98–111)
Creatinine, Ser: 0.77 mg/dL (ref 0.61–1.24)
GFR calc Af Amer: >60 mL/min (ref >60)
GFR calc non Af Amer: >60 mL/min (ref >60)
Glucose, Bld: 300 mg/dL — ABNORMAL HIGH (ref 70–99)
Potassium: 4.3 mmol/L (ref 3.5–5.1)
Sodium: 148 mmol/L — ABNORMAL HIGH (ref 135–145)

## 2019-06-07 LAB — BPAM RBC
Blood Product Expiration Date: 202012062359
Blood Product Expiration Date: 202012262359
ISSUE DATE / TIME: 202011280855
ISSUE DATE / TIME: 202011290644
Unit Type and Rh: 5100
Unit Type and Rh: 5100

## 2019-06-07 LAB — MAGNESIUM: Magnesium: 2.4 mg/dL (ref 1.7–2.4)

## 2019-06-07 MED ORDER — FUROSEMIDE 10 MG/ML IJ SOLN
20.0000 mg | Freq: Two times a day (BID) | INTRAMUSCULAR | Status: DC
  Administered 2019-06-07 – 2019-06-10 (×6): 20 mg via INTRAVENOUS
  Filled 2019-06-07 (×6): qty 2

## 2019-06-07 MED ORDER — HALOPERIDOL LACTATE 5 MG/ML IJ SOLN
5.0000 mg | Freq: Once | INTRAMUSCULAR | Status: AC
  Administered 2019-06-07: 5 mg via INTRAVENOUS
  Filled 2019-06-07: qty 1

## 2019-06-07 MED ORDER — LEVALBUTEROL HCL 0.63 MG/3ML IN NEBU
0.6300 mg | INHALATION_SOLUTION | Freq: Four times a day (QID) | RESPIRATORY_TRACT | Status: DC
  Administered 2019-06-07 (×2): 0.63 mg via RESPIRATORY_TRACT
  Filled 2019-06-07 (×2): qty 3

## 2019-06-07 MED ORDER — INSULIN GLARGINE 100 UNIT/ML ~~LOC~~ SOLN
6.0000 [IU] | Freq: Every day | SUBCUTANEOUS | Status: DC
  Administered 2019-06-07 – 2019-06-08 (×2): 6 [IU] via SUBCUTANEOUS
  Filled 2019-06-07 (×2): qty 0.06

## 2019-06-07 NOTE — Care Management Important Message (Signed)
Important Message  Patient Details  Name: Cody Hale MRN: [id number] Date of Birth: Nov 23, 1950   Medicare Important Message Given:  Yes     Memory Argue 06/07/2019, 1:37 PM

## 2019-06-07 NOTE — Progress Notes (Signed)
Progress Note  Patient Name: Cody Hale Date of Encounter: 06/07/2019  Primary Cardiologist: Prentice Docker, MD  Subjective   Sleeping comfortably in bed, awakens to voice. Denies pain.  Inpatient Medications    Scheduled Meds: . sodium chloride   Intravenous Once  . ascorbic acid  500 mg Per Tube Daily  . carvedilol  3.125 mg Per Tube BID WC  . chlorhexidine  15 mL Mouth Rinse BID  . Chlorhexidine Gluconate Cloth  6 each Topical Daily  . feeding supplement (PRO-STAT SUGAR FREE 64)  30 mL Per Tube BID  . fluticasone  2 spray Each Nare Daily  . fluticasone furoate-vilanterol  1 puff Inhalation Daily  . free water  200 mL Per Tube Q4H  . furosemide  20 mg Intravenous Q12H  . insulin aspart  0-20 Units Subcutaneous Q4H  . insulin glargine  6 Units Subcutaneous Daily  . levalbuterol  0.63 mg Nebulization Q6H  . liver oil-zinc oxide   Topical QID  . loratadine  10 mg Per Tube QPM  . losartan  12.5 mg Per Tube Daily  . mouth rinse  15 mL Mouth Rinse q12n4p  . multivitamin with minerals  1 tablet Per Tube Daily  . pantoprazole sodium  40 mg Per Tube Daily  . simvastatin  20 mg Per Tube q morning - 10a  . sodium chloride flush  10-40 mL Intracatheter Q12H  . sodium chloride flush  3 mL Intravenous Q12H  . tamsulosin  0.4 mg Oral QPC breakfast  . torsemide  20 mg Per Tube q1800  . umeclidinium bromide  1 puff Inhalation Daily  . Vilazodone HCl  40 mg Oral Daily   Continuous Infusions: . sodium chloride 250 mL (06/03/19 2141)  . ceFEPime (MAXIPIME) IV 2 g (06/07/19 1438)  . feeding supplement (OSMOLITE 1.5 CAL) 1,000 mL (06/07/19 0849)  . vancomycin 1,500 mg (06/07/19 0007)   PRN Meds: acetaminophen, [DISCONTINUED] acetaminophen **OR** acetaminophen, docusate, ondansetron **OR** ondansetron (ZOFRAN) IV, oxyCODONE, polyethylene glycol, sodium chloride flush, sodium chloride flush   Vital Signs    Vitals:   06/07/19 0356 06/07/19 0752 06/07/19 1217 06/07/19 1618   BP: (!) 141/92 (!) 152/57 (!) 149/71 131/65  Pulse: 91 89 78 (!) 38  Resp:  18 20 20   Temp: 99.3 F (37.4 C) 98.4 F (36.9 C) 98.5 F (36.9 C) 98.1 F (36.7 C)  TempSrc: Axillary   Oral  SpO2: 96% 95% 94% 97%  Weight:      Height:        Intake/Output Summary (Last 24 hours) at 06/07/2019 1740 Last data filed at 06/07/2019 1500 Gross per 24 hour  Intake 2683.33 ml  Output 1300 ml  Net 1383.33 ml   Last 3 Weights 06/04/2019 06/03/2019 06/02/2019  Weight (lbs) 258 lb 6.1 oz 250 lb 10.6 oz 252 lb 4.8 oz  Weight (kg) 117.2 kg 113.7 kg 114.443 kg  Some encounter information is confidential and restricted. Go to Review Flowsheets activity to see all data.      Telemetry    Rate controlled afib with frequent PVCs vs aberrant conduction - Personally Reviewed  ECG    Afib, LBBB - Personally Reviewed  Physical Exam   GEN: Frail/ill appearing  Neck: No JVD sitting nearly upright Cardiac: irregularly irregular S1 and S2, no murmurs, rubs, or gallops.  Respiratory: Coarse but air movement heart throughout GI: Soft, nontender, non-distended. PEG tube in palce MS: bilateral trace LE edema; No deformity. Neuro:  Nonfocal  Psych: not  very conversant but appropriate to questions   Labs    High Sensitivity Troponin:   Recent Labs  Lab 06/01/19 0050 06/01/19 0316 06/07/19 1135 06/07/19 1334  TROPONINIHS 18* 18* 32* 34*      Chemistry Recent Labs  Lab 06/02/19 0643 06/04/19 1000 06/07/19 1611  NA 145 148* 148*  K 4.3 4.0 4.3  CL 104 107 110  CO2 32 31 27  GLUCOSE 196* 158* 300*  BUN 29* 43* 28*  CREATININE 0.60* 0.84 0.77  CALCIUM 8.4* 8.3* 8.3*  GFRNONAA >60 >60 >60  GFRAA >60 >60 >60  ANIONGAP 9 10 11      Hematology Recent Labs  Lab 06/05/19 0448  06/06/19 0335 06/06/19 1227 06/07/19 0525  WBC 8.9  --  10.3  --  10.0  RBC 2.50*  --  2.76*  --  3.14*  HGB 6.9*   < > 7.7* 8.7* 8.8*  HCT 23.7*   < > 26.5* 28.8* 30.0*  MCV 94.8  --  96.0  --  95.5   MCH 27.6  --  27.9  --  28.0  MCHC 29.1*  --  29.1*  --  29.3*  RDW 19.7*  --  19.3*  --  18.7*  PLT 285  --  277  --  234   < > = values in this interval not displayed.    BNP Recent Labs  Lab 06/03/19 2149  BNP 368.9*     DDimer No results for input(s): DDIMER in the last 168 hours.   Radiology    Dg Chest Port 1 View  Result Date: 06/07/2019 CLINICAL DATA:  Hypoxia, dyspnea, with smoking history EXAM: PORTABLE CHEST 1 VIEW COMPARISON:  06/03/2019 chest radiograph. FINDINGS: Right PICC terminates at the cavoatrial junction. Intact sternotomy wires. Stable cardiomediastinal silhouette with mild to moderate cardiomegaly. No pneumothorax. Stable small left pleural effusion. No right pleural effusion. Hazy bilateral parahilar lung opacities, asymmetric to the left, mildly worsened. IMPRESSION: 1. Mild-to-moderate cardiomegaly. Mildly worsened bilateral hazy parahilar lung opacities, asymmetric to the left, favor asymmetric pulmonary edema. 2. Stable small left pleural effusion. Electronically Signed   By: Delbert Phenix M.D.   On: 06/07/2019 12:43    Cardiac Studies   TEE 05/20/19  1. Left ventricular ejection fraction, by visual estimation, is 40 to 45%. The left ventricle has mild to moderately decreased function. There is moderately increased left ventricular hypertrophy.  2. Global right ventricle has mildly reduced systolic function.The right ventricular size is mildly enlarged.  3. 58mm [employer] Jude bioprosthetic mitral valve. Trace mitral valve regurgitation.     Mean gradient 9 mmHg at 87 bpm, stable from prior echo 05/17/18  4. The tricuspid valve is normal in structure. Tricuspid valve regurgitation is trivial.  5. The aortic valve is tricuspid. Aortic valve regurgitation is trivial.  6. The pulmonic valve was not well visualized. Pulmonic valve regurgitation is trivial.  7. The atrial septum is grossly normal.  8. No vegetation seen  Patient Profile     68 y.o. 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 Recent history notable for Covid infection with respiratory failure. Bacteremia, no vegetation by TEE. Follow by cardiology most recently for NSVT.  Assessment & Plan    NSVT: no recent long events. Has PVCs vs. afib with aberrancy on telemetry -on carvedilol 3.125 mg BID, continue  Chronic atrial fibrillation: rate control  with carvedilol, as above -continue rate control -CHA2DS2/VAS Stroke Risk Points=5, but anticoagulation on hold given anemia and retroperitoneal hematomas. Restart rivaroxaban when able  Chronic systolic and diastolic heart failure -EF 40-45% -continue carvedilol 3.125 mg BID -appears to be transitioning from IV to po diuretic -on losartan 12.5 mg daily, monitor blood pressure  [facility] will sign off.   Medication Recommendations:  As ordered. Restart anticoagulation when able based on RP hematoma and anemia.  Other recommendations (labs, testing, etc):  none Follow up as an outpatient: Please contact us when he is approaching discharge, and we will arrange follow up with Dr. Bronson Ing or his team.  For questions or updates, please contact [facility] Please consult [email or web address] for contact info under     Signed, Buford Dresser, MD  06/07/2019, 5:40 PM

## 2019-06-07 NOTE — Progress Notes (Signed)
0240: Pt. Confused, A&O x1 to self, agitated, yelling, and pulling at lines/tubes. MD on call notified. Order for Haldol received and administered to pt. Pt. Now calm and resting. Will continue to monitor.

## 2019-06-07 NOTE — Progress Notes (Signed)
PROGRESS NOTE  Cody Hale WUJ:[id number] DOB: Jun 17, 1951 DOA: 05/06/2019 PCP: Etta GrandchildJones, Thomas L, MD  HPI/Recap of past 24 hours: Cody Hale is a 68 y.o. 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  Patient fell onto his buttock.  Started hurting and was brought to the ER.  Denies any incontinence of urine or bowel.  Patient's pain has been significant.  ED Course: In the ER at [facility] MRI showed unstable lumbar fracture and patient was transferred to [facility].  Patient had Covid test done which was positive.  Patient otherwise is denying any shortness of breath has been a some productive cough chest x-ray was showing cardiomegaly and nothing acute.  Other labs show BNP of 326 LDH 331 CRP 14.7 WBC 11.9 hemoglobin 13.8 platelets 104 creatinine 1.3.  Neurosurgery has been consulted patient is being admitted for further management of lumbar fracture with COVID-19 being positive.     Brief interval history:Patient with a history of atrial fibrillation on chronic anticoagulation with Xarelto, CAD, history of endocarditis, left bundle branch block, history of mitral valve replacement, chronic systolic heart failure, SVT, COPD, chronic venous insufficiency, and hypertension who presented on 10/29 after a fall.  He was found to have an unstable fracture of his lumbar spine but was also found to be positive for (asymptomatic) COVID-19 infection.   While waiting for surgical clearance (increased risk of complications in the setting of surgery with COVID infection) and developed GBS bacteremia, now off antibiotics.  He had multilevel spinal fusion on 11/17.  He was difficult to ventilate in the OR and so kept intubated overnight.  Patient was previously on the hospitalist service.   Currently on RA.  He  is quite deconditioned due to prolonged hospitalization.  He will need aggressive pulmonary toilet.  PT is seeing him.  Neurosurgery is following and he has a drain in place, but he is stable from this standpoint.  He is likely to need rehab - CIR vs. SNF, likely too early to tell which.  TRH will resume care of the patient on 11/21.  11/21: Nonproductive cough and back pain. 11/23: Right hip pain.   11/24: Bedside RN reports 30 beats of V. tach overnight, asymptomatic.  Twelve-lead EKG showed A. fib with frequent PVCs.  Last TEE done on 05/20/2019 showed LVEF 40 to 45%.  Cardiology consulted.  11/25: Discussed with IR for possible peg tube placement on 06/04/19. CT abd pelvis done to look at the structure showed incidentally found retroperitoneal hematomas affecting b/l iliacus musculature similar in hind site to lumbar spine CT performed 05/25/2019. Correlation with hematocrit levels is advised. Will hold hep drip until repeat H&H results. Hg trend up 11/26: Plan for PEG tube placement 06/04/2019 by interventional radiology.  11/27: POD #0 post peg tube placement by interventional radiology on 06/04/19. 11/28: Hemoglobin dropped this morning, 6.9.  Stopped IV heparin.  1 unit PRBC ordered to be transfused.  Stat CT abdomen and pelvis showed enlarging bilateral retroperitoneal hematomas.  Cardiology team PA Barrett paged as re-consult and made aware.   11/29: Patient was seen and examined at his bedside this morning.  At the time of this visit he was no complaining of abdominal pain however he received pain medication earlier this morning due to lower abdominal pain.  Seen by general surgery, no procedures planned,  not indicated at this time.  Patient scheduled for staples removal 1 week from 11/23 per neurosurgery, Dr. Luanna Salk note.  Neurosurgery has been curb-sided regarding hematoma..  Of note, per radiology report done on 06/02/19, bilateral retroperitoneal hematomas involving iliacus muscles  were present prior to neurosurgery on 05/22/19.  06/07/19: Patient was seen and examined at his bedside this morning.  Agitated overnight and received Haldol.  Bigeminy reported by telemetry this morning.  Will obtain a twelve-lead EKG to assess QTC.  Assessment/Plan: Active Problems:   OSA (obstructive sleep apnea)   Type II diabetes mellitus with manifestations (HCC)   Chronic systolic CHF (congestive heart failure) (HCC)   Hyperlipidemia with target LDL less than 70   Chronic atrial fibrillation   Chronic respiratory failure with hypoxia (HCC)   Unstable burst fracture of unspecified lumbar vertebra, initial encounter for closed fracture (HCC)   Closed unstable burst fracture of lumbar vertebra (HCC)   Closed unstable burst fracture of first lumbar vertebra (HCC)   Pressure injury of skin   Bacteremia   Hyponatremia   NSVT (nonsustained ventricular tachycardia) (HCC)  POD #14 post T11-L4 fusion for L1 Chance fracture. Seen by neurosurgery, no new recommendations, signed off on 05/31/2019. Pain management and bowel regimen as needed.  Incidentally found retroperitoneal Hematomas affecting iliacus musculature bilaterally, first noted per radiology report on 05/22/19 (on report it is dated 05/25/19 but no CT was done at that date) Found incidentally from CT abd/pelvis ordered to evaluate abdominal structure prior to peg tube placement. Off heparin drip Chronic Afib with Chadsvasc 4. Ok to keep off anticoagulation per cardiology. Hg drop 7.7 from 8.2 yesterday Will transfuse to keep hg >8.0 Seen by general surgery, surgical intervention not indicated at this time. Contacted neurosurgery to further assess 06/06/19. Neurosurgical intervention not indicated. Continue to hold off anticoagulation and continue to follow H&H Hemoglobin is stable 8.8 on 06/07/2019 from 8.7 on 06/06/2019. No sign Of overt bleeding.  Acute metabolic encephalopathy Possibly delirium in the setting of acute  illness Received Haldol overnight Continue to monitor and reorient as needed.  Bigeminy reported by telemetry Back on Coreg which was held yesterday due to concern for hypotension Will obtain a twelve-lead EKG. Cardiology informed by bedside RN.  Dysphagia post PEG tube placement by IR on 06/04/2019 Continue PEG tube feeding for nutrition and medication Continue aspiration precautions Continue frequent suctioning by RT Coretract removed on 06/05/2019.  Acute hypoxic respiratory failure possible silent aspiration Continue RT suctioning Continue to maintain O2 saturation greater than 92% Continue aspiration precautions Continue bronchodilators and IV Lasix  Essential hypertension  Soft blood pressures have resolved Resume blood pressure medication  Resolved fever with T-max 102 on 06/03/2019 on IV antibiotics Chest x-ray obtained, findings as stated above Sputum culture and blood cultures obtained Sputum culture shows moderate gram-positive rods and rare gram positive cocci in pairs. Blood cultures negative to date Continue to follow cultures Continue empiric antibiotics  Post acute hypoxic hypercarbic respiratory failure requiring invasive mechanical ventilation likely secondary to COVID-19 pneumonia versus severe COPD exacerbation Not On oxygen supplementation at baseline Extubated on 05/26/2019 Currently on room air 96%.  Acute urinary retention Indwelling Foley catheter placed on 05/29/2019, removed on 06/01/2019. Bethanechol started on 05/30/2019, continue DC bethanechol on 06/06/2019 Continue tamsulosin  Chronic A. Fib Back on Coreg 3.125 mg twice daily Continue to hold off anticoagulation due to retroperitoneal hematoma Continue to closely monitor on telemetry  Streptococcous Group G bacteremia, poa Blood cx x 2 positive on 05/10/19.  Repeat blood cx x2 done on 05/11/19 staph coag neg x1 (likely contaminant) and other bottle negative growth final. Treated.   Completed course of treatment Blood cultures drawn on 05/29/2019 due to concern for fever negative today. Continue to follow cultures  Treated COVID-19 viral pneumonia  Treated with 5 days of remdesivir Continue bronchodilators and Lasix  COPD exacerbation Continue bronchodialators  Chronic systolic CHF Last 2D echo showed LVEF 40 to 45% on 05/20/2019. Continue strict I's and O's and daily weight Resume Coreg 3.125 mg twice daily, losartan 12.5 mg daily,Torsemide 20 mg daily  GERD Continue PPI  Hyperlipidemia Continue statin  Physical debility/ambulatory dysfunction PT assessment recommended to continue improvement toward CIR. OT assessed and recommended SNF with 24-hour supervision/assistance and 3 in 1 bedside commode. Continue PT OT with assistance and fall precautions CSW consulted to assist with placement   Code Status: Full code  Family Communication: None at bedside.  Disposition Plan: SNF in the next 48 to 72 hours.  Consultants:  Neurosurgery, signed off on 05/31/2019.  PCCM  Cardiology consulted on 06/01/2019.  Interventional radiology  Procedures: 11/17: PROCEDURE: 2. Open reduction, internal fixation L1-2 Chance fracture 3. Posterior segmental instrumented pedicle screw stabilization, T11-L4 4. Posterolateral arthrodesis, T11-L4 Use of non-structural morcellized bone allograft - DBX 05/25/2019 intubation in OR per anesthesia>>extubated 11/18 PEG tube placement on 06/04/2019 by interventional radiology.  Antimicrobials: Penicillin G 11/3-11/17 Cefepime 06/03/2019>> IV vancomycin 06/03/2019>>  Significant Hospital Events   10/29 admitted for back pain  10/29 positive Covid test-asymptomatic GBS bacteremia, TEE negative for endocarditis.  Completed antibiotics on 1116 Mild exacerbation of COPD requiring diuresis 11/12 Multilevel spinal stabilization 11/17.   DVT prophylaxis: SCds  Objective: Vitals:   06/06/19 1915 06/06/19 2328  06/07/19 0356 06/07/19 0752  BP: 111/63 (!) 127/57 (!) 141/92 (!) 152/57  Pulse: 98 92 91 89  Resp: 20   18  Temp: 98.3 F (36.8 C) 98.2 F (36.8 C) 99.3 F (37.4 C) 98.4 F (36.9 C)  TempSrc: Oral Oral Axillary   SpO2: 100%  96% 95%  Weight:      Height:        Intake/Output Summary (Last 24 hours) at 06/07/2019 1013 Last data filed at 06/07/2019 0900 Gross per 24 hour  Intake 2683.33 ml  Output 1000 ml  Net 1683.33 ml   Filed Weights   06/02/19 0440 06/03/19 0347 06/04/19 0431  Weight: 114.4 kg 113.7 kg 117.2 kg    Exam:  General: 68 y.o. year-old male obese in no acute distress.  Alert and confused.   Cardiovascular: IrRegular rate and rhythm no rubs or gallops. Respiratory: Improved breath bilaterally.  No wheezing noted. Abdomen: Bowel sounds present.  PEG tube in place.  Musculoskeletal: Trace lower extremities bilaterally.   Psychiatry: Mood is appropriate for condition and setting.   Data Reviewed: CBC: Recent Labs  Lab 06/03/19 0345  06/04/19 1815 06/05/19 0448 06/05/19 2050 06/06/19 0335 06/06/19 1227 06/07/19 0525  WBC 6.9  --  10.6* 8.9  --  10.3  --  10.0  HGB 8.1*   < > 7.5* 6.9* 8.3* 7.7* 8.7* 8.8*  HCT 27.3*   < > 25.7* 23.7* 28.2* 26.5* 28.8* 30.0*  MCV 95.1  --  94.5 94.8  --  96.0  --  95.5  PLT 270  --  301 285  --  277  --  234   < > = values in this interval not displayed.   Basic Metabolic Panel: Recent Labs  Lab 06/01/19 0050  06/01/19 0316 06/02/19 0643 06/04/19 1000  NA 146* 146* 145 148*  K 4.7 4.7 4.3 4.0  CL 105 105 104 107  CO2 35* 34* 32 31  GLUCOSE 179* 197* 196* 158*  BUN 37* 36* 29* 43*  CREATININE 0.57* 0.57* 0.60* 0.84  CALCIUM 8.5* 8.6* 8.4* 8.3*  MG 2.5*  --  2.3  --   PHOS 3.3  --   --   --    GFR: Estimated Creatinine Clearance: 106.3 mL/min (by C-G formula based on SCr of 0.84 mg/dL). Liver Function Tests: No results for input(s): AST, ALT, ALKPHOS, BILITOT, PROT, ALBUMIN in the last 168 hours. No  results for input(s): LIPASE, AMYLASE in the last 168 hours. No results for input(s): AMMONIA in the last 168 hours. Coagulation Profile: No results for input(s): INR, PROTIME in the last 168 hours. Cardiac Enzymes: No results for input(s): CKTOTAL, CKMB, CKMBINDEX, TROPONINI in the last 168 hours. BNP (last 3 results) No results for input(s): PROBNP in the last 8760 hours. HbA1C: No results for input(s): HGBA1C in the last 72 hours. CBG: Recent Labs  Lab 06/06/19 1257 06/06/19 1617 06/06/19 1958 06/07/19 0423 06/07/19 0748  GLUCAP 248* 220* 240* 246* 260*   Lipid Profile: No results for input(s): CHOL, HDL, LDLCALC, TRIG, CHOLHDL, LDLDIRECT in the last 72 hours. Thyroid Function Tests: No results for input(s): TSH, T4TOTAL, FREET4, T3FREE, THYROIDAB in the last 72 hours. Anemia Panel: No results for input(s): VITAMINB12, FOLATE, FERRITIN, TIBC, IRON, RETICCTPCT in the last 72 hours. Urine analysis:    Component Value Date/Time   COLORURINE YELLOW 05/19/2019 1355   APPEARANCEUR CLEAR 05/19/2019 1355   APPEARANCEUR Clear 07/23/2017   LABSPEC 1.013 05/19/2019 1355   PHURINE 5.0 05/19/2019 1355   GLUCOSEU NEGATIVE 05/19/2019 1355   GLUCOSEU 500 (A) 07/14/2017 1053   HGBUR SMALL (A) 05/19/2019 1355   BILIRUBINUR NEGATIVE 05/19/2019 1355   BILIRUBINUR Negative 07/23/2017   KETONESUR NEGATIVE 05/19/2019 1355   PROTEINUR NEGATIVE 05/19/2019 1355   UROBILINOGEN 0.2 07/14/2017 1053   NITRITE NEGATIVE 05/19/2019 1355   LEUKOCYTESUR NEGATIVE 05/19/2019 1355   Sepsis Labs: @LABRCNTIP (procalcitonin:4,lacticidven:4)  ) Recent Results (from the past 240 hour(s))  Culture, blood (routine x 2)     Status: None   Collection Time: 05/29/19  4:14 PM   Specimen: BLOOD LEFT HAND  Result Value Ref Range Status   Specimen Description BLOOD LEFT HAND  Final   Special Requests AEROBIC BOTTLE ONLY Blood Culture adequate volume  Final   Culture   Final    NO GROWTH 5 DAYS Performed at  [facility], [address] [place], [place] [address]    Report Status 06/03/2019 FINAL  Final  Culture, blood (routine x 2)     Status: None   Collection Time: 05/29/19  4:14 PM   Specimen: BLOOD LEFT HAND  Result Value Ref Range Status   Specimen Description BLOOD LEFT HAND  Final   Special Requests AEROBIC BOTTLE ONLY Blood Culture adequate volume  Final   Culture   Final    NO GROWTH 5 DAYS Performed at [facility], [address] [place], [place] [address]    Report Status 06/03/2019 FINAL  Final  Expectorated sputum assessment w rflx to resp cult     Status: None   Collection Time: 06/03/19  9:44 PM   Specimen: Expectorated Sputum  Result Value Ref Range Status   Specimen Description EXPECTORATED SPUTUM  Final   Special Requests Normal  Final   Sputum evaluation   Final    Sputum specimen not acceptable for testing.  Please recollect.   RESULT CALLED TO, READ BACK BY AND VERIFIED WITH: H HUNT RN 06/04/19 0100 JDW Performed at [facility], [address], [place], [place] [address]    Report Status 06/05/2019 FINAL  Final  Culture, blood (routine x 2)     Status: None (Preliminary result)   Collection Time: 06/03/19  9:45 PM   Specimen: BLOOD LEFT ARM  Result Value Ref Range Status   Specimen Description BLOOD LEFT ARM  Final   Special Requests   Final    BOTTLES DRAWN AEROBIC AND ANAEROBIC Blood Culture adequate volume   Culture   Final    NO GROWTH 4 DAYS Performed at [facility], [place] [address], [place], [place] [address]    Report Status PENDING  Incomplete  Culture, blood (routine x 2)     Status: None (Preliminary result)   Collection Time: 06/03/19  9:50 PM   Specimen: BLOOD LEFT HAND  Result Value Ref Range Status   Specimen Description BLOOD LEFT HAND  Final   Special Requests AEROBIC BOTTLE ONLY Blood Culture adequate volume  Final   Culture   Final    NO GROWTH 4 DAYS Performed at [facility], [place] [address],  [place], [place] [address]    Report Status PENDING  Incomplete  Expectorated sputum assessment w rflx to resp cult     Status: None   Collection Time: 06/04/19  3:21 AM   Specimen: SPU  Result Value Ref Range Status   Specimen Description SPUTUM  Final   Special Requests NONE  Final   Sputum evaluation   Final    THIS SPECIMEN IS ACCEPTABLE FOR SPUTUM CULTURE Performed at [facility], [address], [place], [place] [address]    Report Status 06/05/2019 FINAL  Final  Culture, respiratory     Status: None (Preliminary result)   Collection Time: 06/04/19  3:21 AM   Specimen: SPU  Result Value Ref Range Status   Specimen Description SPUTUM  Final   Special Requests NONE Reflexed from [id number]  Final   Gram Stain   Final    ABUNDANT WBC PRESENT, PREDOMINANTLY PMN MODERATE GRAM POSITIVE RODS Performed at [facility], [place] [address], [place], [place] [address]    Culture ABUNDANT DIPHTHEROIDS(CORYNEBACTERIUM SPECIES)  Final   Report Status PENDING  Incomplete      Studies: No results found.  Scheduled Meds: . sodium chloride   Intravenous Once  . ascorbic acid  500 mg Per Tube Daily  . carvedilol  3.125 mg Per Tube BID WC  . chlorhexidine  15 mL Mouth Rinse BID  . Chlorhexidine Gluconate Cloth  6 each Topical Daily  . feeding supplement (PRO-STAT SUGAR FREE 64)  30 mL Per Tube BID  . fluticasone  2 spray Each Nare Daily  . fluticasone furoate-vilanterol  1 puff Inhalation Daily  . free water  200 mL Per Tube Q4H  . furosemide  20 mg Intravenous Daily  . insulin aspart  0-20 Units Subcutaneous Q4H  . liver oil-zinc oxide   Topical QID  . loratadine  10 mg Per Tube QPM  . losartan  12.5 mg Per Tube Daily  . mouth rinse  15 mL Mouth Rinse q12n4p  . multivitamin with minerals  1 tablet Per Tube Daily  . pantoprazole sodium  40 mg Per Tube  Daily  . simvastatin  20 mg Per Tube q morning - 10a  . sodium chloride flush  10-40 mL Intracatheter Q12H  . sodium chloride  flush  3 mL Intravenous Q12H  . tamsulosin  0.4 mg Oral QPC breakfast  . torsemide  20 mg Per Tube q1800  . umeclidinium bromide  1 puff Inhalation Daily  . Vilazodone HCl  40 mg Oral Daily    Continuous Infusions: . sodium chloride 250 mL (06/03/19 2141)  . ceFEPime (MAXIPIME) IV Stopped (06/07/19 0556)  . feeding supplement (OSMOLITE 1.5 CAL) 1,000 mL (06/07/19 0849)  . vancomycin 1,500 mg (06/07/19 0007)     LOS: 32 days     Darlin Drop, MD [facility] Pager [phone]  If 7PM-7AM, please contact night-coverage [email or web address] Password [email or web address] 06/07/2019, 10:13 AM

## 2019-06-07 NOTE — Progress Notes (Signed)
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 Remained intubated until 07/25/18 PMHx: Afib, MVR, SVT, anxiety, CAD, COPD, CHF, HTN,     PT Comments    Pt has ST elevation and elevated troponins, but cleared to see by Dr Nevada Crane.  Despite that, the pt was too lethargic and mildly agitated, so worked on ROM and stretching of bil LE.    Follow Up Recommendations  SNF     Equipment Recommendations  Other (comment)    Recommendations for Other Services       Precautions / Restrictions Precautions Precautions: Back;Fall Required Braces or Orthoses: Spinal Brace Spinal Brace: Thoracolumbosacral orthotic;Applied in sitting position    Mobility  Bed Mobility                  Transfers                    Ambulation/Gait                 Stairs             Wheelchair Mobility    Modified Rankin (Stroke Patients Only)       Balance                                            Cognition Arousal/Alertness: Awake/alert Behavior During Therapy: Agitated Overall Cognitive Status: Impaired/Different from baseline                                        Exercises Other Exercises Other Exercises: P/AAROM bil LE in hip/knee flex/ext and bil hip add/IR with stretch for better positioning when get to EOB next treatment    General Comments        Pertinent Vitals/Pain Pain Assessment: Faces Faces  Pain Scale: Hurts even more Pain Location: hip/knee bil Pain Descriptors / Indicators: Discomfort;Grimacing;Moaning Pain Intervention(s): Monitored during session;Limited activity within patient's tolerance    Home Living                      Prior Function            PT Goals (current goals can now be found in the care plan section) Acute Rehab PT Goals Patient Stated Goal: to get comfortable PT Goal Formulation: With patient Time For Goal Achievement: 06/09/19 Potential to Achieve Goals: Fair Progress towards PT goals: Not progressing toward goals - comment(agitated, troponins up)    Frequency    Min 5X/week      PT Plan Current plan remains appropriate    Co-evaluation              AM-PAC PT "6 Clicks" Mobility   Outcome Measure  Help needed turning from your back to your side while in a flat bed without  using bedrails?: A Lot Help needed moving from lying on your back to sitting on the side of a flat bed without using bedrails?: A Lot Help needed moving to and from a bed to a chair (including a wheelchair)?: Total Help needed standing up from a chair using your arms (e.g., wheelchair or bedside chair)?: Total Help needed to walk in hospital room?: Total Help needed climbing 3-5 steps with a railing? : Total 6 Click Score: 8    End of Session   Activity Tolerance: Patient limited by lethargy;Treatment limited secondary to agitation Patient left: in bed;with call bell/phone within reach;with bed alarm set Nurse Communication: Mobility status PT Visit Diagnosis: Other abnormalities of gait and mobility (R26.89);Muscle weakness (generalized) (M62.81);Pain;History of falling (Z91.81) Pain - part of body: Hip;Leg     Time: 6440-3474 PT Time Calculation (min) (ACUTE ONLY): 12 min  Charges:  $Therapeutic Exercise: 8-22 mins                     06/07/2019  Avilla Bing, PT Acute Rehabilitation Services [phone]  (pager) [phone]   (office)   Eliseo Gum Aijalon Demuro 06/07/2019, 5:41 PM

## 2019-06-07 NOTE — Progress Notes (Signed)
Dr. Nevada Crane advised of ventricular bigeminy and ST elevation reported by Tele.  EKG and troponins ordered, Dr. Nevada Crane to contact Cards.

## 2019-06-07 NOTE — Progress Notes (Addendum)
  Speech Language Pathology Treatment: Dysphagia  Patient Details Name: Cody Hale MRN: [id number] DOB: Jan 13, 1951 Today's Date: 06/07/2019 Time: 9509-3267 SLP Time Calculation (min) (ACUTE ONLY): 13 min  Assessment / Plan / Recommendation Clinical Impression  Pt was encountered asleep in bed and he remained lethargic throughout this tx session.  Pt with a baseline congested cough upon SLP arrival.  Unable to find respiratory muscle trainer (RMT) in the pt's room and RN stated that she had not seen it recently.  She provided pt with spirometer in place of RMT.  Pt completed 5 sets of breaths with 242ml of resistance given moderate verbal cues.  Pt was too fatigued to complete additional sets.  SLP then completed oral care with suction swab without difficulty prior to po trials.  Pt was seen with one small ice chip trial.  Pt accepted the bolus passively into his oral cavity and he exhibited labial closure and lingual manipulation in response to stimuli.  Pt was unable to tolerate HOB raised beyond 40 degrees secondary to back pain, therefore he was not in an ideal position for safe swallowing.  He exhibited a delayed swallow initiation with slightly increased RR and HR.  No additional trials were given on this date secondary to lethargy and subsequent increased risk of aspiration.  Suspect that pt will be able to participate more with ST when he is more alert.  SLP will continue to f/u for dysphagia tx per POC.      HPI HPI: Cody Hale is a 68 y.o. 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 Hospital course complicated by [id number] infection positive with no  symptoms though he did complete 5 days of remdesivir and full course of heparin drip GBS bacteremia IV penicillin. Pt noted to grimace with pills.       SLP Plan  Continue with current plan of care       Recommendations  Diet recommendations: NPO Medication Administration: Via alternative means                Oral Care Recommendations: Oral care QID;Staff/trained caregiver to provide oral care Follow up Recommendations: Skilled Nursing facility SLP Visit Diagnosis: Dysphagia, pharyngeal phase (R13.13) Plan: Continue with current plan of care                     Colin Mulders M.S., [place] Office: [phone]  [place] 06/07/2019, 2:57 PM

## 2019-06-08 LAB — BASIC METABOLIC PANEL
Anion gap: 13 (ref 5–15)
BUN: 28 mg/dL — ABNORMAL HIGH (ref 8–23)
CO2: 27 mmol/L (ref 22–32)
Calcium: 8.2 mg/dL — ABNORMAL LOW (ref 8.9–10.3)
Chloride: 105 mmol/L (ref 98–111)
Creatinine, Ser: 0.68 mg/dL (ref 0.61–1.24)
GFR calc Af Amer: >60 mL/min (ref >60)
GFR calc non Af Amer: >60 mL/min (ref >60)
Glucose, Bld: 293 mg/dL — ABNORMAL HIGH (ref 70–99)
Potassium: 4.2 mmol/L (ref 3.5–5.1)
Sodium: 145 mmol/L (ref 135–145)

## 2019-06-08 LAB — CULTURE, BLOOD (ROUTINE X 2)
Culture: NO GROWTH
Culture: NO GROWTH
Special Requests: ADEQUATE
Special Requests: ADEQUATE

## 2019-06-08 LAB — GLUCOSE, CAPILLARY
Glucose-Capillary: 203 mg/dL — ABNORMAL HIGH (ref 70–99)
Glucose-Capillary: 232 mg/dL — ABNORMAL HIGH (ref 70–99)
Glucose-Capillary: 255 mg/dL — ABNORMAL HIGH (ref 70–99)
Glucose-Capillary: 263 mg/dL — ABNORMAL HIGH (ref 70–99)
Glucose-Capillary: 271 mg/dL — ABNORMAL HIGH (ref 70–99)
Glucose-Capillary: 291 mg/dL — ABNORMAL HIGH (ref 70–99)
Glucose-Capillary: 304 mg/dL — ABNORMAL HIGH (ref 70–99)

## 2019-06-08 LAB — CULTURE, RESPIRATORY W GRAM STAIN

## 2019-06-08 MED ORDER — INSULIN GLARGINE 100 UNIT/ML ~~LOC~~ SOLN
15.0000 [IU] | Freq: Every day | SUBCUTANEOUS | Status: DC
  Administered 2019-06-09: 15 [IU] via SUBCUTANEOUS
  Filled 2019-06-08: qty 0.15

## 2019-06-08 MED ORDER — INSULIN GLARGINE 100 UNIT/ML ~~LOC~~ SOLN
8.0000 [IU] | Freq: Once | SUBCUTANEOUS | Status: AC
  Administered 2019-06-08: 8 [IU] via SUBCUTANEOUS
  Filled 2019-06-08: qty 0.08

## 2019-06-08 MED ORDER — INSULIN GLARGINE 100 UNIT/ML ~~LOC~~ SOLN: 14.0000 [IU] | Freq: Every day | SUBCUTANEOUS | Status: DC

## 2019-06-08 NOTE — Progress Notes (Addendum)
PROGRESS NOTE  Cody Hale FMB:[id number] DOB: 1950/09/14 DOA: 05/06/2019 PCP: [name], MD  HPI/Recap of past 24 hours: Cody Hale is a 68 y.o. 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  Patient fell onto his buttock.  Started hurting and was brought to the ER.  Denies any incontinence of urine or bowel.  Patient's pain has been significant.  ED Course: In the ER at [facility] MRI showed unstable lumbar fracture and patient was transferred to [facility].  Patient had Covid test done which was positive.  Patient otherwise is denying any shortness of breath has been a some productive cough chest x-ray was showing cardiomegaly and nothing acute.  Other labs show BNP of 326 LDH 331 CRP 14.7 WBC 11.9 hemoglobin 13.8 platelets 104 creatinine 1.3.  Neurosurgery has been consulted patient is being admitted for further management of lumbar fracture with COVID-19 being positive.     Brief interval history:Patient with a history of atrial fibrillation on chronic anticoagulation with Xarelto, CAD, history of endocarditis, left bundle branch block, history of mitral valve replacement, chronic systolic heart failure, SVT, COPD, chronic venous insufficiency, and hypertension who presented on 10/29 after a fall.  He was found to have an unstable fracture of his lumbar spine but was also found to be positive for (asymptomatic) COVID-19 infection.   While waiting for surgical clearance (increased risk of complications in the setting of surgery with COVID infection) and developed GBS bacteremia, now off antibiotics.  He had multilevel spinal fusion on 11/17.  He was difficult to ventilate in the OR and so kept intubated overnight.  Patient was previously on the hospitalist service.   Currently on RA.  He  is quite deconditioned due to prolonged hospitalization.  He will need aggressive pulmonary toilet.  PT is seeing him.  Neurosurgery is following and he has a drain in place, but he is stable from this standpoint.  He is likely to need rehab - CIR vs. SNF, likely too early to tell which.  TRH will resume care of the patient on 11/21.  11/21: Nonproductive cough and back pain. 11/23: Right hip pain.   11/24: Bedside RN reports 30 beats of V. tach overnight, asymptomatic.  Twelve-lead EKG showed A. fib with frequent PVCs.  Last TEE done on 05/20/2019 showed LVEF 40 to 45%.  Cardiology consulted.  11/25: Discussed with IR for possible peg tube placement on 06/04/19. CT abd pelvis done to look at the structure showed incidentally found retroperitoneal hematomas affecting b/l iliacus musculature similar in hind site to lumbar spine CT performed 05/25/2019. Correlation with hematocrit levels is advised. Will hold hep drip until repeat H&H results. Hg trend up 11/26: Plan for PEG tube placement 06/04/2019 by interventional radiology.  11/27: POD #0 post peg tube placement by interventional radiology on 06/04/19. 11/28: Hemoglobin dropped this morning, 6.9.  Stopped IV heparin.  1 unit PRBC ordered to be transfused.  Stat CT abdomen and pelvis showed enlarging bilateral retroperitoneal hematomas.  Cardiology team PA Barrett paged as re-consult and made aware.   11/29: Patient was seen and examined at his bedside this morning.  At the time of this visit he was no complaining of abdominal pain however he received pain medication earlier this morning due to lower abdominal pain.  Seen by general surgery, no procedures planned,  not indicated at this time.  Patient scheduled for staples removal 1 week from 11/23 per neurosurgery, Dr. Luanna Salk note.  Neurosurgery has been curb-sided regarding hematoma..  Of note, per radiology report done on 06/02/19, bilateral retroperitoneal hematomas involving iliacus muscles  were present prior to neurosurgery on 05/22/19.  11/30:  Agitated overnight and received Haldol.  Bigeminy reported by telemetry this morning. Reassessed by cardiology. Continue current management.  06/08/19: Patient was seen and examined at his bedside this morning.  No acute events overnight.  He is more interactive today.  He is alert and oriented x3 and answers to questions appropriately.  Assessment/Plan: Active Problems:   OSA (obstructive sleep apnea)   Type II diabetes mellitus with manifestations (HCC)   Chronic systolic CHF (congestive heart failure) (HCC)   Hyperlipidemia with target LDL less than 70   Chronic atrial fibrillation   Chronic respiratory failure with hypoxia (HCC)   Unstable burst fracture of unspecified lumbar vertebra, initial encounter for closed fracture (HCC)   Closed unstable burst fracture of lumbar vertebra (HCC)   Closed unstable burst fracture of first lumbar vertebra (HCC)   Pressure injury of skin   Bacteremia   Hyponatremia   NSVT (nonsustained ventricular tachycardia) (HCC)  POD #15 post T11-L4 fusion for L1 Chance fracture. Seen by neurosurgery, no new recommendations, signed off on 05/31/2019. Pain management and bowel regimen as needed.  Incidentally found retroperitoneal Hematomas affecting iliacus musculature bilaterally, first noted per radiology report on 05/22/19 (on report it is dated 05/25/19 but no CT was done at that date) Found incidentally from CT abd/pelvis ordered to evaluate abdominal structure prior to peg tube placement. Off heparin drip Chronic Afib with Chadsvasc 4. Ok to keep off anticoagulation for now per cardiology. Hg drop 7.7 on 11/29. Transfused 1U PRBC>> 8.7 post transfusion on 06/06/19. Repeat Hg 8.8 on 06/07/19. Seen by general surgery, surgical intervention not indicated at this time. Contacted neurosurgery to further assess 06/06/19. Neurosurgical intervention not indicated. Continue to hold off anticoagulation  and continue to follow H&H  Resolved fever with T-max 102 on 06/03/2019 on IV antibiotics Chest x-ray obtained, findings as stated above Sputum culture and blood cultures obtained Sputum culture shows Corynebacterium.   Completed course of cefepime IV vancomycin will be discontinued on 06/09/2019. Blood cultures negative to date.  Corynebacterium HCAP Sputum culture taken on 06/04/2019 grew abundant Corynebacterium Improving afebrile no leukocytosis. Continue pulmonary toilet Completed 5 days of cefepime Completed IV vancomycin on 06/09/2019. Appreciate pharmacy's assistance.  Resolved Acute metabolic encephalopathy Possibly delirium in the setting of acute illness At the time of this visit patient is alert oriented x3.  Answers all questions appropriately. Continue to redirect.  Transient bigeminy reported by telemetry Continue medications as recommended by cardiology.  Dysphagia post PEG tube placement by IR on 06/04/2019 Continue PEG tube feeding for nutrition and medication Continue aspiration precautions Continue frequent suctioning by RT Coretract removed on 06/05/2019.  Acute hypoxic respiratory failure possible silent aspiration Continue RT suctioning Continue to maintain O2 saturation greater than 92% Continue aspiration precautions Continue bronchodilators and IV Lasix  Essential hypertension  Soft blood pressures have resolved Resume blood pressure medication  Post acute hypoxic hypercarbic respiratory failure requiring invasive mechanical ventilation likely secondary to COVID-19 pneumonia versus severe COPD exacerbation Not On oxygen supplementation at baseline Extubated on 05/26/2019 Maintain O2 saturation greater than 92%.  Resolved acute urinary retention Indwelling Foley catheter placed on 05/29/2019, removed on 06/01/2019. Bethanechol started on 05/30/2019. DC bethanechol on 06/06/2019 Started tamsulosin  on 06/06/2019, continue.   Monitor urine  output.  Chronic A. Fib Back on Coreg 3.125 mg twice daily Continue to hold off anticoagulation due to retroperitoneal hematoma Continue to monitor on telemetry.  Streptococcous Group G bacteremia, poa Blood cx x 2 positive on 05/10/19. Repeat blood cx x2 done on 05/11/19 staph coag neg x1 (likely contaminant) and other bottle negative growth final. Treated.  Completed course of treatment Blood cultures drawn on 05/29/2019 due to concern for fever negative today.  Negative to date.  Treated COVID-19 viral pneumonia  Treated with 5 days of remdesivir Continue bronchodilators and Lasix  COPD exacerbation Continue bronchodialators Oxygen supplementation as needed.  Chronic systolic CHF Last 2D echo showed LVEF 40 to 45% on 05/20/2019. Continue strict I's and O's and daily weight Resume Coreg 3.125 mg twice daily, losartan 12.5 mg daily,Torsemide 20 mg daily  GERD Continue PPI  Hyperlipidemia Continue statin  Physical debility/ambulatory dysfunction PT assessment recommended to continue improvement toward CIR. OT assessed and recommended SNF with 24-hour supervision/assistance and 3 in 1 bedside commode. Continue PT OT with assistance and fall precautions CSW consulted to assist with placement   Code Status: Full code  Family Communication: None at bedside.  Disposition Plan: SNF in the next 48 to 72 hours.  Consultants:  Neurosurgery, signed off on 05/31/2019.  PCCM  Cardiology consulted on 06/01/2019.  Interventional radiology  Procedures: 11/17: PROCEDURE: 2. Open reduction, internal fixation L1-2 Chance fracture 3. Posterior segmental instrumented pedicle screw stabilization, T11-L4 4. Posterolateral arthrodesis, T11-L4 Use of non-structural morcellized bone allograft - DBX 05/25/2019 intubation in OR per anesthesia>>extubated 11/18 PEG tube placement on 06/04/2019 by interventional radiology.  Antimicrobials: Penicillin G 11/3-11/17 Cefepime  06/03/2019>> IV vancomycin 06/03/2019>>  Significant Hospital Events   10/29 admitted for back pain  10/29 positive Covid test-asymptomatic GBS bacteremia, TEE negative for endocarditis.  Completed antibiotics on 1116 Mild exacerbation of COPD requiring diuresis 11/12 Multilevel spinal stabilization 11/17.   DVT prophylaxis: SCds, not on pharmacological DVT prophylaxis due to retroperitoneal hematoma.  Objective: Vitals:   06/08/19 0401 06/08/19 0500 06/08/19 0814 06/08/19 1218  BP:   (!) 138/50 (!) 121/55  Pulse:   (!) 114 82  Resp:   20 (!) 22  Temp: (!) 97.5 F (36.4 C)  97.9 F (36.6 C) (!) 97.4 F (36.3 C)  TempSrc: Oral  Oral   SpO2:   98% 95%  Weight:  116.5 kg    Height:        Intake/Output Summary (Last 24 hours) at 06/08/2019 1422 Last data filed at 06/08/2019 0900 Gross per 24 hour  Intake 800 ml  Output 3075 ml  Net -2275 ml   Filed Weights   06/03/19 0347 06/04/19 0431 06/08/19 0500  Weight: 113.7 kg 117.2 kg 116.5 kg    Exam:  General: 68 y.o. year-old male obese pleasant in no acute distress.  Alert and oriented x3.   Cardiovascular: Irregular rate and rhythm no rubs or gallops. Respiratory: Mild rales at bases no wheezing noted.  Poor inspiratory effort.   Abdomen: Bowel sounds present.  PEG tube in place.  Musculoskeletal: Trace lower extremity edema bilaterally.   Psychiatry: Mood is appropriate for condition and setting.  Data Reviewed: CBC: Recent Labs  Lab 06/03/19 0345  06/04/19 1815 06/05/19 0448 06/05/19 2050 06/06/19 0335 06/06/19 1227 06/07/19 0525  WBC 6.9  --  10.6* 8.9  --  10.3  --  10.0  HGB 8.1*   < > 7.5* 6.9* 8.3* 7.7* 8.7*  8.8*  HCT 27.3*   < > 25.7* 23.7* 28.2* 26.5* 28.8* 30.0*  MCV 95.1  --  94.5 94.8  --  96.0  --  95.5  PLT 270  --  301 285  --  277  --  234   < > = values in this interval not displayed.   Basic Metabolic Panel: Recent Labs  Lab 06/02/19 0643 06/04/19 1000 06/07/19 1611 06/08/19 0500  NA  145 148* 148* 145  K 4.3 4.0 4.3 4.2  CL 104 107 110 105  CO2 32 GLUCOSE 196* 158* 300* 293*  BUN 29* 43* 28* 28*  CREATININE 0.60* 0.84 0.77 0.68  CALCIUM 8.4* 8.3* 8.3* 8.2*  MG 2.3  --  2.4  --    GFR: Estimated Creatinine Clearance: 111.3 mL/min (by C-G formula based on SCr of 0.68 mg/dL). Liver Function Tests: No results for input(s): AST, ALT, ALKPHOS, BILITOT, PROT, ALBUMIN in the last 168 hours. No results for input(s): LIPASE, AMYLASE in the last 168 hours. No results for input(s): AMMONIA in the last 168 hours. Coagulation Profile: No results for input(s): INR, PROTIME in the last 168 hours. Cardiac Enzymes: No results for input(s): CKTOTAL, CKMB, CKMBINDEX, TROPONINI in the last 168 hours. BNP (last 3 results) No results for input(s): PROBNP in the last 8760 hours. HbA1C: No results for input(s): HGBA1C in the last 72 hours. CBG: Recent Labs  Lab 06/07/19 2341 06/08/19 0404 06/08/19 0605 06/08/19 0813 06/08/19 1232  GLUCAP 291* 271* 255* 232* 304*   Lipid Profile: No results for input(s): CHOL, HDL, LDLCALC, TRIG, CHOLHDL, LDLDIRECT in the last 72 hours. Thyroid Function Tests: No results for input(s): TSH, T4TOTAL, FREET4, T3FREE, THYROIDAB in the last 72 hours. Anemia Panel: No results for input(s): VITAMINB12, FOLATE, FERRITIN, TIBC, IRON, RETICCTPCT in the last 72 hours. Urine analysis:    Component Value Date/Time   COLORURINE YELLOW 05/19/2019 1355   APPEARANCEUR CLEAR 05/19/2019 1355   APPEARANCEUR Clear 07/23/2017   LABSPEC 1.013 05/19/2019 1355   PHURINE 5.0 05/19/2019 1355   GLUCOSEU NEGATIVE 05/19/2019 1355   GLUCOSEU 500 (A) 07/14/2017 1053   HGBUR SMALL (A) 05/19/2019 1355   BILIRUBINUR NEGATIVE 05/19/2019 1355   BILIRUBINUR Negative 07/23/2017   KETONESUR NEGATIVE 05/19/2019 1355   PROTEINUR NEGATIVE 05/19/2019 1355   UROBILINOGEN 0.2 07/14/2017 1053   NITRITE NEGATIVE 05/19/2019 1355   LEUKOCYTESUR NEGATIVE 05/19/2019 1355     Sepsis Labs: (procalcitonin:4,lacticidven:4)  ) Recent Results (from the past 240 hour(s))  Culture, blood (routine x 2)     Status: None   Collection Time: 05/29/19  4:14 PM   Specimen: BLOOD LEFT HAND  Result Value Ref Range Status   Specimen Description BLOOD LEFT HAND  Final   Special Requests AEROBIC BOTTLE ONLY Blood Culture adequate volume  Final   Culture   Final    NO GROWTH 5 DAYS Performed at [facility], [address] [place], [place] [address]    Report Status 06/03/2019 FINAL  Final  Culture, blood (routine x 2)     Status: None   Collection Time: 05/29/19  4:14 PM   Specimen: BLOOD LEFT HAND  Result Value Ref Range Status   Specimen Description BLOOD LEFT HAND  Final   Special Requests AEROBIC BOTTLE ONLY Blood Culture adequate volume  Final   Culture   Final    NO GROWTH 5 DAYS Performed at [facility], [address] [place], [place] [address]  Report Status 06/03/2019 FINAL  Final  Expectorated sputum assessment w rflx to resp cult     Status: None   Collection Time: 06/03/19  9:44 PM   Specimen: Expectorated Sputum  Result Value Ref Range Status   Specimen Description EXPECTORATED SPUTUM  Final   Special Requests Normal  Final   Sputum evaluation   Final    Sputum specimen not acceptable for testing.  Please recollect.   RESULT CALLED TO, READ BACK BY AND VERIFIED WITH: H HUNT RN 06/04/19 0100 JDW Performed at [facility], [address] [place], [place] [address]    Report Status 06/05/2019 FINAL  Final  Culture, blood (routine x 2)     Status: None   Collection Time: 06/03/19  9:45 PM   Specimen: BLOOD LEFT ARM  Result Value Ref Range Status   Specimen Description BLOOD LEFT ARM  Final   Special Requests   Final    BOTTLES DRAWN AEROBIC AND ANAEROBIC Blood Culture adequate volume   Culture   Final    NO GROWTH 5 DAYS Performed at [facility], [address] [place], [place] [address]    Report  Status 06/08/2019 FINAL  Final  Culture, blood (routine x 2)     Status: None   Collection Time: 06/03/19  9:50 PM   Specimen: BLOOD LEFT HAND  Result Value Ref Range Status   Specimen Description BLOOD LEFT HAND  Final   Special Requests AEROBIC BOTTLE ONLY Blood Culture adequate volume  Final   Culture   Final    NO GROWTH 5 DAYS Performed at [facility], [address] [place], [place] [address]    Report Status 06/08/2019 FINAL  Final  Expectorated sputum assessment w rflx to resp cult     Status: None   Collection Time: 06/04/19  3:21 AM   Specimen: SPU  Result Value Ref Range Status   Specimen Description SPUTUM  Final   Special Requests NONE  Final   Sputum evaluation   Final    THIS SPECIMEN IS ACCEPTABLE FOR SPUTUM CULTURE Performed at [facility], [address] [place], [place] [address]    Report Status 06/05/2019 FINAL  Final  Culture, respiratory     Status: None   Collection Time: 06/04/19  3:21 AM   Specimen: SPU  Result Value Ref Range Status   Specimen Description SPUTUM  Final   Special Requests NONE Reflexed from [id number]  Final   Gram Stain   Final    ABUNDANT WBC PRESENT, PREDOMINANTLY PMN MODERATE GRAM POSITIVE RODS    Culture   Final    ABUNDANT CORYNEBACTERIUM STRIATUM Standardized susceptibility testing for this organism is not available. Performed at [facility]  [facility], [address] [place], [place] [address]    Report Status 06/08/2019 FINAL  Final      Studies: No results found.  Scheduled Meds:  sodium chloride   Intravenous Once   ascorbic acid  500 mg Per Tube Daily   carvedilol  3.125 mg Per Tube BID WC   chlorhexidine  15 mL Mouth Rinse BID   Chlorhexidine Gluconate Cloth  6 each Topical Daily   feeding supplement (PRO-STAT SUGAR FREE 64)  30 mL Per Tube BID   fluticasone  2 spray Each Nare Daily   fluticasone furoate-vilanterol  1 puff Inhalation Daily   free water  200 mL Per Tube Q4H   furosemide  20  mg Intravenous Q12H  insulin aspart  0-20 Units Subcutaneous Q4H   [START ON 06/09/2019] insulin glargine  14 Units Subcutaneous Daily   insulin glargine  8 Units Subcutaneous Once   liver oil-zinc oxide   Topical QID   loratadine  10 mg Per Tube QPM   losartan  12.5 mg Per Tube Daily   mouth rinse  15 mL Mouth Rinse q12n4p   multivitamin with minerals  1 tablet Per Tube Daily   pantoprazole sodium  40 mg Per Tube Daily   simvastatin  20 mg Per Tube q morning - 10a   sodium chloride flush  10-40 mL Intracatheter Q12H   sodium chloride flush  3 mL Intravenous Q12H   tamsulosin  0.4 mg Oral QPC breakfast   torsemide  20 mg Per Tube q1800   umeclidinium bromide  1 puff Inhalation Daily   Vilazodone HCl  40 mg Oral Daily    Continuous Infusions:  sodium chloride 250 mL (06/03/19 2141)   ceFEPime (MAXIPIME) IV 2 g (06/08/19 1312)   feeding supplement (OSMOLITE 1.5 CAL) 1,000 mL (06/07/19 0849)   vancomycin Stopped (06/08/19 0020)     LOS: 33 days     Darlin Droparole N Jettie Mannor, MD [facility] Pager [phone]  If 7PM-7AM, please contact night-coverage [email or web address] Password [email or web address] 06/08/2019, 2:22 PM

## 2019-06-08 NOTE — Progress Notes (Signed)
Inpatient Diabetes Program Recommendations  AACE/ADA: New Consensus Statement on Inpatient Glycemic Control (2015)  Target Ranges:  Prepandial:   less than 140 mg/dL      Peak postprandial:   less than 180 mg/dL (1-2 hours)      Critically ill patients:  140 - 180 mg/dL   Lab Results  Component Value Date   GLUCAP 232 (H) 06/08/2019   HGBA1C 7.4 (H) 03/24/2019    Review of Glycemic Control Results for Cody Hale, Cody "DAVID" (MRN [id number]) as of 06/08/2019 10:52  Ref. Range 06/07/2019 23:41 06/08/2019 04:04 06/08/2019 06:05 06/08/2019 08:13  Glucose-Capillary Latest Ref Range: 70 - 99 mg/dL 291 (H) 271 (H) 255 (H) 232 (H)   Diabetes history: DM2 Outpatient Diabetes medications: Tresiba 50 units daily, Novolog 5 units TID with meals, Farxiga 10 mg daily, Trulicity 1.5 mg Qweek Current orders for Inpatient glycemic control: Lantus 6 units daily, Novolog 0-20 units Q4H;  Osmolite 1.5 cal @ 50ml/hr  Inpatient Diabetes Program Recommendations:   Glucose trends exceeding 250's mg/dL. Consider increasing Lantus to 20 units QD and adding Novolog 3 units Q4H (to be stopped or held if tube feeds are stopped).  Thanks, Bronson Curb, MSN, RNC-OB Diabetes Coordinator [phone] (8a-5p)

## 2019-06-08 NOTE — Evaluation (Signed)
Occupational Therapy Evaluation Patient Details Name: Cody Hale MRN: [id number] DOB: [birth year] Today's Date: 06/08/2019    History of Present Illness Pt is a 68 yo male presented 05/06/19 to ED after fall  (he tripped over some blankets), onto his buttocks with severe back pain;  CT lumbar spine showing unstable fracture L1-2 and likely ankylosing spondylitis; tested +COVID and developed fever and required TEE to r/o endocarditis prior to surgery;  11/17 underwent open reduction internal fixation of L1-L2 Chance fracture with multilevel spine stabilization from T11-L4. Remained intubated until 07/25/18 PMHx: Afib, MVR, SVT, anxiety, CAD, COPD, CHF, HTN,    Clinical Impression   Pt continues to present with decreased balance, strength, and activity tolerance. Pt significant limited by pain. Pt tolerating sitting at EOB with Min Guard A demonstrating increased sitting balance; requiring occasionally Min A for balance corrections. Pt requiring Max A for donning socks at bed level and then Max A for donning brace at EOB. Pt requiring Max A +2 for sit<>stand and Total A for peri care after bowel incontinence. Continue to recommend dc to SNF and will continue to follow acutely as admitted.     Follow Up Recommendations  SNF;Supervision/Assistance - 24 hour    Equipment Recommendations  3 in 1 bedside commode    Recommendations for Other Services       Precautions / Restrictions Precautions Precautions: Back;Fall Precaution Booklet Issued: No Required Braces or Orthoses: Spinal Brace Spinal Brace: Thoracolumbosacral orthotic;Applied in sitting position      Mobility Bed Mobility Overal bed mobility: Needs Assistance Bed Mobility: Rolling;Sidelying to Sit;Sit to Supine Rolling: Max assist;+2 for physical assistance Sidelying to sit: Max assist;+2 for physical assistance;+2 for safety/equipment   Sit to supine: Total assist;+2 for physical assistance;Max assist   General bed mobility  comments: repetitively cued for best technique, worked on normalized roll and coming up from the right side.  Transfers Overall transfer level: Needs assistance Equipment used: Rolling walker (2 wheeled);None Transfers: Sit to/from Stand Sit to Stand: Max assist;+2 safety/equipment         General transfer comment: Used RW x1 standing trial and face to face assist for 2 trials for peri-care after stool.  Added two stand/scoot transfers up toward [facility], before returning to supine..    Balance Overall balance assessment: Needs assistance Sitting-balance support: Bilateral upper extremity supported;No upper extremity supported Sitting balance-Leahy Scale: Poor Sitting balance - Comments: pt can balance with UE assist, but tends to list posterior with fatigue or loss of focus.  Otherwise, pt needs minimal external support for safety   Standing balance support: Bilateral upper extremity supported;During functional activity Standing balance-Leahy Scale: Zero Standing balance comment: full external support during standing                           ADL either performed or assessed with clinical judgement   ADL Overall ADL's : Needs assistance/impaired                 Upper Body Dressing : Maximal assistance;Sitting Upper Body Dressing Details (indicate cue type and reason): Max A for donning brace while sitting at EOB Lower Body Dressing: Maximal assistance;Bed level Lower Body Dressing Details (indicate cue type and reason): Max A for donning socks at bedlevel.      Toileting- Clothing Manipulation and Hygiene: Total assistance;+2 for physical assistance;Sit to/from stand Toileting - Clothing Manipulation Details (indicate cue type and reason): +2 for sit<>stand and then Total A  for peri care after BM     Functional mobility during ADLs: Maximal assistance;+2 for physical assistance(sit<>stand only) General ADL Comments: Pt presenting with decreased balance, strength,  and activity tolerance. limited by pain     Vision         Perception     Praxis      Pertinent Vitals/Pain Pain Assessment: Faces Faces Pain Scale: Hurts little more Pain Location: right hip/back Pain Descriptors / Indicators: Tender;Stabbing(flinching at all movement) Pain Intervention(s): Monitored during session;Limited activity within patient's tolerance;Repositioned     Hand Dominance     Extremity/Trunk Assessment Upper Extremity Assessment Upper Extremity Assessment: Generalized weakness   Lower Extremity Assessment Lower Extremity Assessment: Defer to PT evaluation       Communication     Cognition Arousal/Alertness: Awake/alert Behavior During Therapy: Restless Overall Cognitive Status: Impaired/Different from baseline Area of Impairment: Attention;Following commands;Problem solving                   Current Attention Level: Sustained   Following Commands: Follows one step commands with increased time     Problem Solving: Slow processing;Decreased initiation     General Comments  Called Chris at [employer] to come once more to trim pt's TLSO for better fit under arms and lower front edge where it approximates to the thighs.    Exercises Exercises: Other exercises Other Exercises Other Exercises: P/AAROM bil LE in hip/knee flex/ext and bil hip add/IR with stretch for better positioning when get to EOB next treatment Other Exercises: Active shoulder flexion ROM of bil UE's   Shoulder Instructions      Home Living                                          Prior Functioning/Environment                   OT Problem List:        OT Treatment/Interventions:      OT Goals(Current goals can be found in the care plan section) Acute Rehab OT Goals Patient Stated Goal: to get comfortable OT Goal Formulation: With patient Time For Goal Achievement: 06/09/19 Potential to Achieve Goals: Good ADL Goals Pt Will Perform  Grooming: with min assist;sitting Pt Will Perform Upper Body Dressing: with min assist;sitting Pt Will Transfer to Toilet: stand pivot transfer;bedside commode;with mod assist Pt/caregiver will Perform Home Exercise Program: Both right and left upper extremity;Increased strength;With minimal assist;With written HEP provided Additional ADL Goal #1: Pt will follow 100% commands in 4/6 trials with 90% accuracy.  OT Frequency: Min 2X/week   Barriers to D/C:            Co-evaluation PT/OT/SLP Co-Evaluation/Treatment: Yes Reason for Co-Treatment: Complexity of the patient's impairments (multi-system involvement);For patient/therapist safety;To address functional/ADL transfers PT goals addressed during session: Mobility/safety with mobility OT goals addressed during session: ADL's and self-care      AM-PAC OT "6 Clicks" Daily Activity     Outcome Measure Help from another person eating meals?: Total Help from another person taking care of personal grooming?: A Lot Help from another person toileting, which includes using toliet, bedpan, or urinal?: Total Help from another person bathing (including washing, rinsing, drying)?: Total Help from another person to put on and taking off regular upper body clothing?: A Lot Help from another person to put on and taking off regular lower  body clothing?: Total 6 Click Score: 8   End of Session Equipment Utilized During Treatment: Back brace Nurse Communication: Mobility status;Precautions  Activity Tolerance: Patient limited by pain Patient left: in bed;with call bell/phone within reach;with bed alarm set  OT Visit Diagnosis: Unsteadiness on feet (R26.81);Muscle weakness (generalized) (M62.81);Pain                Time: 1400-1441 OT Time Calculation (min): 41 min Charges:  OT General Charges $OT Visit: 1 Visit OT Treatments $Self Care/Home Management : 23-37 mins  Sintia Mckissic MSOT, OTR/L Acute Rehab Pager: [phone] Office:  [phone]  Theodoro Grist Zennie Ayars 06/08/2019, 5:36 PM

## 2019-06-08 NOTE — TOC Progression Note (Addendum)
Transition of Care [facility]) - Progression Note    Patient Details  Name: Cody Hale MRN: [id number] Date of Birth: 05-22-1951  Transition of Care [facility]) CM/SW [place], [place] Phone Number: 06/08/2019, 1:02 PM  Clinical Narrative:     CSW spoke with patient's son, [name] to explore PT recommendations of CIR or SNF pending on the patient's progress. Patient's son states patient lives home alone in an apartment , prior to that he was staying at [facility] in [place]. Patient's son reports he lives in close proximity to the patient. However, he works 3rd shift 12 hr rotation and his spouse works 1st shift 12 hr rotation and is unable to provide 24 hrs supervision. Patient's son was agreeable to SNF placement if needed and preferred SNF in the [facility] or "where my fanther can get some good care". Patient son states family support also includes the patient's siblings and his sister.   CSW will continue to follow and assist with discharge planning.  Thurmond Butts, MSW, [facility] Clinical Social Worker [phone]   Expected Discharge Plan: Skilled Nursing Facility Barriers to Discharge: Continued Medical Work up  Expected Discharge Plan and Services Expected Discharge Plan: [place] arrangements for the past 2 months: Single Family Home                                       Social Determinants of Health (SDOH) Interventions    Readmission Risk Interventions No flowsheet data found.

## 2019-06-08 NOTE — Progress Notes (Signed)
  Speech Language Pathology Treatment: Dysphagia  Patient Details Name: Cody Hale MRN: [id number] DOB: 1950/07/18 Today's Date: 06/08/2019 Time: 0940-1006 SLP Time Calculation (min) (ACUTE ONLY): 26 min  Assessment / Plan / Recommendation Clinical Impression  Cody Hale is making progress toward goals. Decreased baseline congestion. No s/s aspiration today with single ice chips, tsp trials water and applesauce after oral care. Strength of volitional cough improved. He is ready for repeat instrumental assessment of swallow. Complete and consistent view of anatomy difficult during prior assessment with FEES, therefore recommend MBS. Discussed need for him to tolerate sitting in much more upright position and challenges due to pain. Pt feels he will be able to try. Will plan for 12/2. Used incentive spirometer and able to increase exhalation force/support.     HPI HPI: Cody Hale is a 68 y.o. 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 Hospital course complicated by [id number] infection positive with no symptoms though he did complete 5 days of remdesivir and full course of heparin drip GBS bacteremia IV penicillin. Pt noted to grimace with pills.       SLP Plan  New goals to be determined pending instrumental study       Recommendations  Diet recommendations: (ice chips)                Plan: New goals to be determined pending instrumental study       GO                [name] 06/08/2019, 10:29 AM    Cody Hale.Ed Risk analyst [phone] Office [phone]

## 2019-06-09 ENCOUNTER — Inpatient Hospital Stay (HOSPITAL_COMMUNITY): Payer: Medicare Other

## 2019-06-09 DIAGNOSIS — S32012G Unstable burst fracture of first lumbar vertebra, subsequent encounter for fracture with delayed healing: Secondary | ICD-10-CM

## 2019-06-09 LAB — GLUCOSE, CAPILLARY
Glucose-Capillary: 202 mg/dL — ABNORMAL HIGH (ref 70–99)
Glucose-Capillary: 248 mg/dL — ABNORMAL HIGH (ref 70–99)
Glucose-Capillary: 252 mg/dL — ABNORMAL HIGH (ref 70–99)
Glucose-Capillary: 265 mg/dL — ABNORMAL HIGH (ref 70–99)
Glucose-Capillary: 271 mg/dL — ABNORMAL HIGH (ref 70–99)
Glucose-Capillary: 279 mg/dL — ABNORMAL HIGH (ref 70–99)

## 2019-06-09 LAB — CBC
HCT: 29.5 % — ABNORMAL LOW (ref 39.0–52.0)
Hemoglobin: 8.7 g/dL — ABNORMAL LOW (ref 13.0–17.0)
MCH: 28.1 pg (ref 26.0–34.0)
MCHC: 29.5 g/dL — ABNORMAL LOW (ref 30.0–36.0)
MCV: 95.2 fL (ref 80.0–100.0)
Platelets: 209 10*3/uL (ref 150–400)
RBC: 3.1 MIL/uL — ABNORMAL LOW (ref 4.22–5.81)
RDW: 17.8 % — ABNORMAL HIGH (ref 11.5–15.5)
WBC: 11.9 10*3/uL — ABNORMAL HIGH (ref 4.0–10.5)
nRBC: 0.2 % (ref 0.0–0.2)

## 2019-06-09 LAB — BASIC METABOLIC PANEL
Anion gap: 10 (ref 5–15)
BUN: 32 mg/dL — ABNORMAL HIGH (ref 8–23)
CO2: 30 mmol/L (ref 22–32)
Calcium: 8.2 mg/dL — ABNORMAL LOW (ref 8.9–10.3)
Chloride: 104 mmol/L (ref 98–111)
Creatinine, Ser: 0.85 mg/dL (ref 0.61–1.24)
GFR calc Af Amer: >60 mL/min (ref >60)
GFR calc non Af Amer: >60 mL/min (ref >60)
Glucose, Bld: 297 mg/dL — ABNORMAL HIGH (ref 70–99)
Potassium: 4.3 mmol/L (ref 3.5–5.1)
Sodium: 144 mmol/L (ref 135–145)

## 2019-06-09 MED ORDER — ALTEPLASE 2 MG IJ SOLR
2.0000 mg | Freq: Once | INTRAMUSCULAR | Status: AC
  Administered 2019-06-09: 2 mg

## 2019-06-09 MED ORDER — INSULIN GLARGINE 100 UNIT/ML ~~LOC~~ SOLN
20.0000 [IU] | Freq: Every day | SUBCUTANEOUS | Status: DC
  Administered 2019-06-10 – 2019-06-18 (×9): 20 [IU] via SUBCUTANEOUS
  Filled 2019-06-09 (×9): qty 0.2

## 2019-06-09 MED ORDER — INSULIN ASPART 100 UNIT/ML ~~LOC~~ SOLN
4.0000 [IU] | SUBCUTANEOUS | Status: DC
  Administered 2019-06-09 – 2019-06-18 (×47): 4 [IU] via SUBCUTANEOUS

## 2019-06-09 NOTE — Progress Notes (Signed)
Modified Barium Swallow Progress Note  Patient Details  Name: Cody Hale MRN: [id number] Date of Birth: [birth year]  Today's Date: 06/09/2019  Modified Barium Swallow completed.  Full report located under Chart Review in the Imaging Section.  Brief recommendations include the following:  Clinical Impression  Pt's swallow function has not improved from prior instrumental exam to initiate po's. He exhibits an anatomical dysphagia marked by osteophyte around C 3-4 as well as what appears to be fusion of majority of cervical spine. Current illness and debilitation leaves him unable to compensate for chronic cervical anomoly resulting in laryngeal penetration with puree and honey thick and silent aspiration of nectar and thin barium. Epiglottic deflection is late and barium is pushed into laryngeal vestibule during the swallow. One instance of penetration after the swallow with puree. Increased vallecular residue with puree which is minimally reduced with cued second swallows. ROM on chin tuck suboptimal and was not signifcant during study. Greater ROM with left side head turn resulting in frank aspiration. There was no safe consistency to initiate po's however SLP will trial puree in therapy and likely initiate water protocol tomorrow. Presently he may have supervised ice chips spaced out during shift. As his overall strength improves with therapy and time his swallow function should get close to baseline.          Swallow Evaluation Recommendations       SLP Diet Recommendations: NPO(possible water protocol)       Medication Administration: Via alternative means               Oral Care Recommendations: Oral care QID        [name] 06/09/2019,4:58 PM   [name] [place].Ed Risk analyst [phone] Office [phone]

## 2019-06-09 NOTE — Progress Notes (Signed)
  Speech Language Pathology    Patient Details Name: Cody Hale MRN: [id number] DOB: 05/29/1951 Today's Date: 06/09/2019 Time: 0940-7680 SLP Time Calculation (min) (ACUTE ONLY): 16 min      MBS completed. Full documentation to follow. Continue NPO status via his PEG. SLP to continue intervention and incorporate trials of honey-puree (with SLP) only and may initiate the free water protocol - more details to follow. Can have supervised ice chips intermittently                   [name] 06/09/2019, 10:27 AM  [name].Ed Risk analyst [phone] Office [phone]

## 2019-06-09 NOTE — Progress Notes (Signed)
PROGRESS NOTE    Cody SitesCarlis Hale  RUE:[id number] DOB: 1951-04-02 DOA: 05/06/2019 PCP: Etta GrandchildJones, Cody L, MD   Brief Narrative:  68 year old with history of combined systolic and diastolic CHF, bioprosthetic mitral valve replacement endocarditis 2014, A. fib, COPD, diabetes presented with fall at home initially from the buttock.  Had any pain ER MRI showed unstable lumbar fracture [facility].  He was tested positive for Covid.  Denied any shortness of breath with minimal productive cough.  Neurosurgery was consulted.  Hospital course further complicated by GBS bacteremia on antibiotics.  After spinal fusion it was difficult to ventilate him requiring him to be intubated.  Drain was placed by neurosurgery.  Echocardiogram showed new onset CHF with EF of 45%.  Also had brief run of V. tach and A. fib.  Further complicated by incidental finding of retroperitoneal hematoma.  Heparin drip was stopped.  IR placed PEG tube on 11/27.   Assessment & Plan:   Active Problems:   OSA (obstructive sleep apnea)   Type II diabetes mellitus with manifestations (HCC)   Chronic systolic CHF (congestive heart failure) (HCC)   Hyperlipidemia with target LDL less than 70   Chronic atrial fibrillation   Chronic respiratory failure with hypoxia (HCC)   Unstable burst fracture of unspecified lumbar vertebra, initial encounter for closed fracture (HCC)   Closed unstable burst fracture of lumbar vertebra (HCC)   Closed unstable burst fracture of first lumbar vertebra (HCC)   Pressure injury of skin   Bacteremia   Hyponatremia   NSVT (nonsustained ventricular tachycardia) (HCC)  Retroperitoneal hematoma affecting gluteus musculature bilaterally -Incidental finding on the scan which was done prior to PEG tube placement.  Currently off anticoagulation.  Monitor hemoglobin, transfuse as necessary.  No surgical intervention.  T11/L4 fusion after her lumbar fracture., postop day 16. -Seen by  neurosurgery.  Pain management.  Bowel control.  Healthcare acquired pneumonia, Cody Hale -Treated with 5 days of cefepime.  Vancomycin discontinued 12/2. -Clinically followed.  Bronchodilators as needed.  Dysphagia requiring PEG tube placement 06/04/2019 -Continue aspiration precaution.  PEG tube feeding per nutrition team.  Mild acute hypoxia -Suspect some silent aspiration.  Supportive care.  As needed bronchodilators.  COVID-19 pneumonia, resolved -Supportive care.  Previously treated with steroids, remdesivir, bronchodilators  Chronic atrial fibrillation -No anticoagulation due to retroperitoneal bleed.  On Coreg 3.125 mg twice daily.  Chronic systolic congestive heart failure -Continuemedications including Coreg, losartan and torsemide.  Ejection fraction 40-45% on the echo performed on 11/12.  Diabetes mellitus type 2, hyperglycemia -Holding outpatient medication.  Will increase Lantus to 20 units.  NovoLog 3 units every 4 hours while tube feeds are ongoing.  GERD -PPI  Hyperlipidemia -Statin  DVT prophylaxis: None Code Status: Full code Family Communication: None Disposition Plan: Awaiting safe disposition planning at skilled nursing facility  Consultants:   Neurosurgery  PCCM  Cardiology  Dementia radiology  Procedures:     Antimicrobials:      Subjective:   Review of Systems Otherwise negative except as per HPI, including: General: Denies fever, chills, night sweats or unintended weight loss. Resp: Denies cough, wheezing, shortness of breath. Cardiac: Denies chest pain, palpitations, orthopnea, paroxysmal nocturnal dyspnea. GI: Denies abdominal pain, nausea, vomiting, diarrhea or constipation GU: Denies dysuria, frequency, hesitancy or incontinence MS: Denies muscle aches, joint pain or swelling Neuro: Denies headache, neurologic deficits (focal weakness, numbness, tingling), abnormal gait Psych: Denies anxiety, depression, SI/HI/AVH  Skin: Denies new rashes or lesions ID: Denies sick contacts, exotic exposures,  travel  Objective: Vitals:   06/09/19 0500 06/09/19 0604 06/09/19 0607 06/09/19 1208  BP:  (!) 127/57 (!) 108/59 (!) 117/52  Pulse:    (!) 119  Resp:   15 (!) 22  Temp:    98 F (36.7 C)  TempSrc:    Oral  SpO2:    92%  Weight: 119 kg     Height:        Intake/Output Summary (Last 24 hours) at 06/09/2019 1330 Last data filed at 06/09/2019 0500 Gross per 24 hour  Intake 100 ml  Output 1401 ml  Net -1301 ml   Filed Weights   06/04/19 0431 06/08/19 0500 06/09/19 0500  Weight: 117.2 kg 116.5 kg 119 kg    Examination:  General exam: Appears calm and comfortable, dry mouth Respiratory system: Anterior slight bilateral rhonchi Cardiovascular system: S1 & S2 heard, RRR. No JVD, murmurs, rubs, gallops or clicks. No pedal edema. Gastrointestinal system: Abdomen is nondistended, soft and nontender. No organomegaly or masses felt. Normal bowel sounds heard.  PEG tube noted Central nervous system: Alert and oriented. No focal neurological deficits. Extremities: Symmetric 4 x 5 power. Skin: No rashes, lesions or ulcers Psychiatry: Poor judgment and insight  Right upper extremity PICC line PEG tube in place External catheter Stage II sacral blood ulcer  Data Reviewed:   CBC: Recent Labs  Lab 06/04/19 1815 06/05/19 0448 06/05/19 2050 06/06/19 0335 06/06/19 1227 06/07/19 0525 06/09/19 0500  WBC 10.6* 8.9  --  10.3  --  10.0 11.9*  HGB 7.5* 6.9* 8.3* 7.7* 8.7* 8.8* 8.7*  HCT 25.7* 23.7* 28.2* 26.5* 28.8* 30.0* 29.5*  MCV 94.5 94.8  --  96.0  --  95.5 95.2  PLT 301 285  --  277  --  234 209   Basic Metabolic Panel: Recent Labs  Lab 06/04/19 1000 06/07/19 1611 06/08/19 0500 06/09/19 0500  NA 148* 148* 145 144  K 4.0 4.3 4.2 4.3  CL 107 110 105 104  CO2 31 27 27 30   GLUCOSE 158* 300* 293* 297*  BUN 43* 28* 28* 32*  CREATININE 0.84 0.77 0.68 0.85  CALCIUM 8.3* 8.3* 8.2* 8.2*  MG  --   2.4  --   --    GFR: Estimated Creatinine Clearance: 105.9 mL/min (by C-G formula based on SCr of 0.85 mg/dL). Liver Function Tests: No results for input(s): AST, ALT, ALKPHOS, BILITOT, PROT, ALBUMIN in the last 168 hours. No results for input(s): LIPASE, AMYLASE in the last 168 hours. No results for input(s): AMMONIA in the last 168 hours. Coagulation Profile: No results for input(s): INR, PROTIME in the last 168 hours. Cardiac Enzymes: No results for input(s): CKTOTAL, CKMB, CKMBINDEX, TROPONINI in the last 168 hours. BNP (last 3 results) No results for input(s): PROBNP in the last 8760 hours. HbA1C: No results for input(s): HGBA1C in the last 72 hours. CBG: Recent Labs  Lab 06/08/19 2012 06/09/19 0003 06/09/19 0451 06/09/19 0845 06/09/19 1203  GLUCAP 263* 252* 265* 279* 202*   Lipid Profile: No results for input(s): CHOL, HDL, LDLCALC, TRIG, CHOLHDL, LDLDIRECT in the last 72 hours. Thyroid Function Tests: No results for input(s): TSH, T4TOTAL, FREET4, T3FREE, THYROIDAB in the last 72 hours. Anemia Panel: No results for input(s): VITAMINB12, FOLATE, FERRITIN, TIBC, IRON, RETICCTPCT in the last 72 hours. Sepsis Labs: Recent Labs  Lab 06/03/19 2149  PROCALCITON 0.24    Recent Results (from the past 240 hour(s))  Expectorated sputum assessment w rflx to resp cult  Status: None   Collection Time: 06/03/19  9:44 PM   Specimen: Expectorated Sputum  Result Value Ref Range Status   Specimen Description EXPECTORATED SPUTUM  Final   Special Requests Normal  Final   Sputum evaluation   Final    Sputum specimen not acceptable for testing.  Please recollect.   RESULT CALLED TO, READ BACK BY AND VERIFIED WITH: H HUNT RN 06/04/19 0100 JDW Performed at [facility], [address] [place], [place] [address]    Report Status 06/05/2019 FINAL  Final  Culture, blood (routine x 2)     Status: None   Collection Time: 06/03/19  9:45 PM   Specimen: BLOOD LEFT ARM  Result  Value Ref Range Status   Specimen Description BLOOD LEFT ARM  Final   Special Requests   Final    BOTTLES DRAWN AEROBIC AND ANAEROBIC Blood Culture adequate volume   Culture   Final    NO GROWTH 5 DAYS Performed at [facility], [place] [address], [place], [place] [address]    Report Status 06/08/2019 FINAL  Final  Culture, blood (routine x 2)     Status: None   Collection Time: 06/03/19  9:50 PM   Specimen: BLOOD LEFT HAND  Result Value Ref Range Status   Specimen Description BLOOD LEFT HAND  Final   Special Requests AEROBIC BOTTLE ONLY Blood Culture adequate volume  Final   Culture   Final    NO GROWTH 5 DAYS Performed at [facility], [place] [address] [place], [place] [address]    Report Status 06/08/2019 FINAL  Final  Expectorated sputum assessment w rflx to resp cult     Status: None   Collection Time: 06/04/19  3:21 AM   Specimen: SPU  Result Value Ref Range Status   Specimen Description SPUTUM  Final   Special Requests NONE  Final   Sputum evaluation   Final    THIS SPECIMEN IS ACCEPTABLE FOR SPUTUM CULTURE Performed at [facility], [place] [address] [place], [place] [address]    Report Status 06/05/2019 FINAL  Final  Culture, respiratory     Status: None   Collection Time: 06/04/19  3:21 AM   Specimen: SPU  Result Value Ref Range Status   Specimen Description SPUTUM  Final   Special Requests NONE Reflexed from [id number]  Final   Gram Stain   Final    ABUNDANT WBC PRESENT, PREDOMINANTLY PMN MODERATE GRAM POSITIVE RODS    Culture   Final    ABUNDANT Cody Hale STRIATUM Standardized susceptibility testing for this organism is not available. Performed at [facility], [place] [address] [place],  [address]    Report Status 06/08/2019 FINAL  Final         Radiology Studies: No results found.      Scheduled Meds: . sodium chloride   Intravenous Once  . ascorbic acid  500 mg Per Tube Daily  . carvedilol  3.125 mg Per Tube BID WC   . chlorhexidine  15 mL Mouth Rinse BID  . Chlorhexidine Gluconate Cloth  6 each Topical Daily  . feeding supplement (PRO-STAT SUGAR FREE 64)  30 mL Per Tube BID  . fluticasone  2 spray Each Nare Daily  . fluticasone furoate-vilanterol  1 puff Inhalation Daily  . free water  200 mL Per Tube Q4H  . furosemide  20 mg Intravenous Q12H  . insulin aspart  0-20 Units Subcutaneous Q4H  .  insulin glargine  15 Units Subcutaneous Daily  . liver oil-zinc oxide   Topical QID  . loratadine  10 mg Per Tube QPM  . losartan  12.5 mg Per Tube Daily  . mouth rinse  15 mL Mouth Rinse q12n4p  . multivitamin with minerals  1 tablet Per Tube Daily  . pantoprazole sodium  40 mg Per Tube Daily  . simvastatin  20 mg Per Tube q morning - 10a  . sodium chloride flush  10-40 mL Intracatheter Q12H  . sodium chloride flush  3 mL Intravenous Q12H  . tamsulosin  0.4 mg Oral QPC breakfast  . torsemide  20 mg Per Tube q1800  . umeclidinium bromide  1 puff Inhalation Daily  . Vilazodone HCl  40 mg Oral Daily   Continuous Infusions: . sodium chloride 250 mL (06/03/19 2141)  . feeding supplement (OSMOLITE 1.5 CAL) 1,000 mL (06/07/19 0849)  . vancomycin 1,500 mg (06/08/19 2128)     LOS: 34 days   Time spent=25 mins    Dagmawi Venable Joline Maxcy, MD [facility]  If 7PM-7AM, please contact night-coverage  06/09/2019, 1:30 PM

## 2019-06-09 NOTE — Progress Notes (Signed)
PT Cancellation Note  Patient Details Name: Cody Hale MRN: [id number] DOB: 05/24/1951   Cancelled Treatment:    Reason Eval/Treat Not Completed: Patient declined, no reason specified.  I'm sick--stomach's upset, my nose is running and my head feels twice it's size.  Will try to see pt 12/3 as able. 06/09/2019  Donnella Sham, PT Acute Rehabilitation Services [phone]  (pager) [phone]  (office)  Cody Hale 06/09/2019, 1:49 PM

## 2019-06-09 NOTE — Consult Note (Addendum)
   [facility] CM Inpatient Consult   06/09/2019  Nathen Balaban [birth year] [id number]   Patient screened for extreme high risk score for unplanned readmission for long length of stay hospitalization noted in the Medicare NextGen ACO.   Patient had previous [facility] outreach attempts without success earlier this year noted as patient was at [facility].  Review of patient's medical record reveals patient is patient is being considered for a skilled facility verse inpatient rehab.  Review of inpatient TOC LCSW notes reveals patient's family is likely for a skilled nursing facility stay. *Addendum: note also reveals patient had returned from ALF to home alone prior to admission.  Primary Care Provider is Scarlette Calico, MD [place]  Follow inpatient [facility] team for disposition needs.  If patient goes to a [facility] can follow for transitional barriers and needs.  Plan: Continue to follow progress and disposition to assess for post hospital care management needs.   Please place a [facility] Care Management consult as appropriate and for questions contact:   Natividad Brood, RN BSN [facility] Liaison  [phone] business mobile phone Toll free office [phone]  Fax number: [phone] [email or web address]

## 2019-06-09 NOTE — Progress Notes (Signed)
Inpatient Diabetes Program Recommendations  AACE/ADA: New Consensus Statement on Inpatient Glycemic Control (2015)  Target Ranges:  Prepandial:   less than 140 mg/dL      Peak postprandial:   less than 180 mg/dL (1-2 hours)      Critically ill patients:  140 - 180 mg/dL   Lab Results  Component Value Date   GLUCAP 279 (H) 06/09/2019   HGBA1C 7.4 (H) 03/24/2019    Review of Glycemic Control Results for Cody Hale, Cody "DAVID" (MRN [id number]) as of 06/09/2019 11:16  Ref. Range 06/08/2019 20:12 06/09/2019 00:03 06/09/2019 04:51 06/09/2019 08:45  Glucose-Capillary Latest Ref Range: 70 - 99 mg/dL 263 (H) 252 (H) 265 (H) 279 (H)   Diabetes history:DM2 Outpatient Diabetes medications:Tresiba 50 units daily, Novolog 5 units TID with meals, Farxiga 10 mg daily, Trulicity 1.5 mg Qweek Current orders for Inpatient glycemic control:Lantus 15 units daily, Novolog 0-20 units Q4H;  Osmolite 1.5 cal @ 14ml/hr  Inpatient Diabetes Program Recommendations:  Glucose trends exceeding 250's mg/dL. Consider increasing Lantus to 20 units QD and adding Novolog 3 units Q4H (to be stopped or held if tube feeds are stopped).  Thanks, Bronson Curb, MSN, RNC-OB Diabetes Coordinator [phone] (8a-5p)

## 2019-06-10 LAB — BASIC METABOLIC PANEL
Anion gap: 11 (ref 5–15)
BUN: 31 mg/dL — ABNORMAL HIGH (ref 8–23)
CO2: 28 mmol/L (ref 22–32)
Calcium: 8.2 mg/dL — ABNORMAL LOW (ref 8.9–10.3)
Chloride: 101 mmol/L (ref 98–111)
Creatinine, Ser: 0.7 mg/dL (ref 0.61–1.24)
GFR calc Af Amer: >60 mL/min (ref >60)
GFR calc non Af Amer: >60 mL/min (ref >60)
Glucose, Bld: 265 mg/dL — ABNORMAL HIGH (ref 70–99)
Potassium: 4.6 mmol/L (ref 3.5–5.1)
Sodium: 140 mmol/L (ref 135–145)

## 2019-06-10 LAB — CBC
HCT: 30.1 % — ABNORMAL LOW (ref 39.0–52.0)
Hemoglobin: 9.1 g/dL — ABNORMAL LOW (ref 13.0–17.0)
MCH: 28.2 pg (ref 26.0–34.0)
MCHC: 30.2 g/dL (ref 30.0–36.0)
MCV: 93.2 fL (ref 80.0–100.0)
Platelets: 199 10*3/uL (ref 150–400)
RBC: 3.23 MIL/uL — ABNORMAL LOW (ref 4.22–5.81)
RDW: 17.5 % — ABNORMAL HIGH (ref 11.5–15.5)
WBC: 13.7 10*3/uL — ABNORMAL HIGH (ref 4.0–10.5)
nRBC: 0 % (ref 0.0–0.2)

## 2019-06-10 LAB — GLUCOSE, CAPILLARY
Glucose-Capillary: 200 mg/dL — ABNORMAL HIGH (ref 70–99)
Glucose-Capillary: 228 mg/dL — ABNORMAL HIGH (ref 70–99)
Glucose-Capillary: 231 mg/dL — ABNORMAL HIGH (ref 70–99)
Glucose-Capillary: 247 mg/dL — ABNORMAL HIGH (ref 70–99)
Glucose-Capillary: 268 mg/dL — ABNORMAL HIGH (ref 70–99)
Glucose-Capillary: 314 mg/dL — ABNORMAL HIGH (ref 70–99)

## 2019-06-10 LAB — MAGNESIUM: Magnesium: 2 mg/dL (ref 1.7–2.4)

## 2019-06-10 NOTE — Progress Notes (Signed)
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 Remained intubated until 07/25/18 PMHx: Afib, MVR, SVT, anxiety, CAD, COPD, CHF, HTN,     PT Comments    Pt attempted to decline again today, but after much encouragement, he agreed to do a little.  Emphasis on strengthening, rolling, transitions, sitting tolerance/balance while donning the brace at EOB, standing and transfer to chair.    Follow Up Recommendations  SNF     Equipment Recommendations  Other (comment)(TBA later)    Recommendations for Other Services       Precautions / Restrictions Precautions Precautions: Back;Fall Required Braces or Orthoses: Spinal Brace Spinal Brace: Thoracolumbosacral orthotic;Applied in sitting position    Mobility  Bed Mobility Overal bed mobility: Needs Assistance Bed Mobility: Rolling;Sidelying to Sit Rolling: Max assist;+2 for physical assistance Sidelying to sit: Max assist;+2 for physical assistance;+2 for safety/equipment          Transfers Overall transfer level: Needs assistance   Transfers: Squat Pivot Transfers     Squat pivot transfers: Max assist;+2 safety/equipment     General transfer comment: pt was not happy about getting OOB, so completed squat pivot with face to face assist instead of the RW, which is more difficult for him.  Ambulation/Gait             General Gait Details: unable   Stairs             Wheelchair Mobility     Modified Rankin (Stroke Patients Only)       Balance Overall balance assessment: Needs assistance Sitting-balance support: Bilateral upper extremity supported;Single extremity supported Sitting balance-Leahy Scale: Poor Sitting balance - Comments: worked on balance at EOB during and after donning the TLSO.  Total time 10 min.  spotting pt and assisting forward due to his tendency to list posteriorly     Standing balance-Leahy Scale: Zero                              Cognition Arousal/Alertness: Awake/alert Behavior During Therapy: Flat affect;Restless Overall Cognitive Status: Impaired/Different from baseline Area of Impairment: Attention;Following commands;Problem solving                   Current Attention Level: Sustained   Following Commands: Follows one step commands with increased time     Problem Solving: Slow processing;Decreased initiation        Exercises Other Exercises Other Exercises: P/AAROM bil LE in hip/knee flex/ext and bil hip add/IR with stretch for better positioning when get to EOB next treatment    General Comments General comments (skin integrity, edema, etc.): BP sitting EOB immediately after sitting  113/64 (74), after 20 min sitting 92/51 (64).      Pertinent Vitals/Pain Pain Assessment: Faces Faces Pain Scale: Hurts little more Pain Location: right hip/back Pain Descriptors / Indicators: Tender;Stabbing Pain Intervention(s): Monitored during session;Limited activity within patient's tolerance    Home Living  Prior Function            PT Goals (current goals can now be found in the care plan section) Acute Rehab PT Goals Patient Stated Goal: to get comfortable PT Goal Formulation: With patient Time For Goal Achievement: 06/23/19 Potential to Achieve Goals: Fair Progress towards PT goals: Not progressing toward goals - comment;Goals downgraded-see care plan(pt is not able to push  himself to progress.)    Frequency    Min 5X/week      PT Plan Current plan remains appropriate    Co-evaluation              AM-PAC PT "6 Clicks" Mobility   Outcome Measure  Help needed turning from your back to your side while in a flat bed without using bedrails?: Total Help needed moving from lying on your back to sitting on the side of a flat bed without using bedrails?: Total Help needed moving to and from a bed to a chair (including a wheelchair)?: Total Help needed standing up from a chair using your arms (e.g., wheelchair or bedside chair)?: Total Help needed to walk in hospital room?: Total Help needed climbing 3-5 steps with a railing? : Total 6 Click Score: 6    End of Session   Activity Tolerance: Patient limited by pain;Patient limited by fatigue Patient left: in chair;with call bell/phone within reach;with chair alarm set;Other (comment)(on a lift pad.) Nurse Communication: Mobility status PT Visit Diagnosis: Other abnormalities of gait and mobility (R26.89);Muscle weakness (generalized) (M62.81);Pain;History of falling (Z91.81) Pain - part of body: Hip;Leg     Time: 1540-0867 PT Time Calculation (min) (ACUTE ONLY): 36 min  Charges:  $Therapeutic Activity: 23-37 mins                     06/10/2019  Donnella Sham, PT Acute Rehabilitation Services [phone]  (pager) [phone]  (office)   Tessie Fass Raniya Golembeski 06/10/2019, 5:00 PM

## 2019-06-10 NOTE — Progress Notes (Signed)
PROGRESS NOTE  Cody Hale AVW:[id number] DOB: Jan 05, 1951 DOA: 05/06/2019 PCP: [name], MD  HPI/Recap of past 12 hours: 68 year old with history of combined systolic and diastolic CHF, bioprosthetic mitral valve replacement endocarditis 2014, A. fib, COPD, diabetes presented with fall at home initially from the buttock.  Had any pain ER MRI showed unstable lumbar fracture transferred to [facility].  He was tested positive for Covid.  Denied any shortness of breath with minimal productive cough.  Neurosurgery was consulted.  Hospital course further complicated by GBS bacteremia on antibiotics.  After spinal fusion it was difficult to ventilate him requiring him to be intubated.  Drain was placed by neurosurgery.  Echocardiogram showed new onset CHF with EF of 45%.  Also had brief run of V. tach and A. fib.  Further complicated by incidental finding of retroperitoneal hematoma.  Heparin drip was stopped.  IR placed PEG tube on 11/27.  06/10/19: Patient was seen and examined at bedside this morning.  No acute events overnight.  States his cough is better.  He has no new complaints.  NPO.  PEG tube in place.  PT OT recommended SNF.  CSW assisting with placement.  Assessment/Plan: Active Problems:   OSA (obstructive sleep apnea)   Type II diabetes mellitus with manifestations (HCC)   Chronic systolic CHF (congestive heart failure) (HCC)   Hyperlipidemia with target LDL less than 70   Chronic atrial fibrillation   Chronic respiratory failure with hypoxia (HCC)   Unstable burst fracture of unspecified lumbar vertebra, initial encounter for closed fracture (HCC)   Closed unstable burst fracture of lumbar vertebra (HCC)   Closed unstable burst fracture of first lumbar vertebra (HCC)   Pressure injury of skin   Bacteremia   Hyponatremia   NSVT (nonsustained ventricular tachycardia) (HCC)   Retroperitoneal hematoma affecting gluteus musculature bilaterally -Incidental finding on  the scan which was done prior to PEG tube placement.  Currently off anticoagulation.  Monitor hemoglobin, transfuse as necessary.  No surgical intervention. Hemoglobin is stable this morning 9.1 on 06/10/2019 from 8.7 on 06/09/2019. No sign of overt bleeding. Continue to hold off anticoagulation for now.  T11/L4 fusion after her lumbar fracture., postop day 17. -Seen by neurosurgery.  Pain management.  Bowel control. Seen by PT OT with recommendation for SNF. CSW assisting with SNF placement.  Healthcare acquired pneumonia, Corynebacterium -Treated with 5 days of cefepime.  Vancomycin discontinued 12/2. -Clinically followed.  Bronchodilators as needed. Completed treatment. Clinically improved  Dysphagia requiring PEG tube placement 06/04/2019 -Continue aspiration precaution.  PEG tube feeding per nutrition team. Continue PEG tube feedings with medications via tube  Mild acute hypoxia -Suspect some silent aspiration.  Supportive care.  As needed bronchodilators. Maintain O2 saturation greater than 92% Home O2 evaluation for DC planning  COVID-19 pneumonia, resolved -Supportive care.  Previously treated with steroids, remdesivir, bronchodilators  Chronic atrial fibrillation -No anticoagulation due to retroperitoneal bleed.  On Coreg 3.125 mg twice daily for rate control. Rate is controlled on Coreg  Chronic systolic congestive heart failure Continue Coreg Hold off losartan due to soft blood pressure Continue torsemide to maintain euvolemia   Ejection fraction 40-45% on the echo performed on 11/12.  Diabetes mellitus type 2, hyperglycemia -Holding outpatient medication.  Continue Lantus to 20 units.    Increase NovoLog dose to 4 units every 4 hours with continuous tube feeding   GERD -PPI  Hyperlipidemia -Statin  DVT prophylaxis:  SCDs Code Status: Full code Family Communication: None Disposition Plan:  Possible discharge to SNF within the next 24 to 48 hours.   Pending bed placement.  Consultants:   Neurosurgery  PCCM  Cardiology  Dementia radiology  Procedures:   PEG tube placement by interventional radiology.   Objective: Vitals:   06/10/19 0500 06/10/19 0505 06/10/19 0715 06/10/19 0734  BP:    111/90  Pulse:    60  Resp:    15  Temp:  98.3 F (36.8 C)  98.7 F (37.1 C)  TempSrc:  Oral  Oral  SpO2:   100% 100%  Weight: 117.4 kg     Height:        Intake/Output Summary (Last 24 hours) at 06/10/2019 1032 Last data filed at 06/09/2019 2125 Gross per 24 hour  Intake -  Output 1450 ml  Net -1450 ml   Filed Weights   06/08/19 0500 06/09/19 0500 06/10/19 0500  Weight: 116.5 kg 119 kg 117.4 kg    Exam:  . General: 68 y.o. year-old male well developed well nourished in no acute distress.  Alert and interactive. . Cardiovascular: Irregular rate and rhythm with no rubs or gallops.  No thyromegaly or JVD noted.   Marland Kitchen. Respiratory: Clear to auscultation with no wheezes or rales. Good inspiratory effort. . Abdomen: Soft nontender nondistended with normal bowel sounds x4 quadrants. . Musculoskeletal: Trace lower extremity edema. 2/4 pulses in all 4 extremities. Marland Kitchen. Psychiatry: Mood is appropriate for condition and setting   Data Reviewed: CBC: Recent Labs  Lab 06/05/19 0448  06/06/19 0335 06/06/19 1227 06/07/19 0525 06/09/19 0500 06/10/19 0456  WBC 8.9  --  10.3  --  10.0 11.9* 13.7*  HGB 6.9*   < > 7.7* 8.7* 8.8* 8.7* 9.1*  HCT 23.7*   < > 26.5* 28.8* 30.0* 29.5* 30.1*  MCV 94.8  --  96.0  --  95.5 95.2 93.2  PLT 285  --  277  --  234 209 199   < > = values in this interval not displayed.   Basic Metabolic Panel: Recent Labs  Lab 06/04/19 1000 06/07/19 1611 06/08/19 0500 06/09/19 0500 06/10/19 0456  NA 148* 148* 145 144 140  K 4.0 4.3 4.2 4.3 4.6  CL 107 110 105 104 101  CO2 31 27 27 30 28   GLUCOSE 158* 300* 293* 297* 265*  BUN 43* 28* 28* 32* 31*  CREATININE 0.84 0.77 0.68 0.85 0.70  CALCIUM 8.3* 8.3*  8.2* 8.2* 8.2*  MG  --  2.4  --   --  2.0   GFR: Estimated Creatinine Clearance: 111.8 mL/min (by C-G formula based on SCr of 0.7 mg/dL). Liver Function Tests: No results for input(s): AST, ALT, ALKPHOS, BILITOT, PROT, ALBUMIN in the last 168 hours. No results for input(s): LIPASE, AMYLASE in the last 168 hours. No results for input(s): AMMONIA in the last 168 hours. Coagulation Profile: No results for input(s): INR, PROTIME in the last 168 hours. Cardiac Enzymes: No results for input(s): CKTOTAL, CKMB, CKMBINDEX, TROPONINI in the last 168 hours. BNP (last 3 results) No results for input(s): PROBNP in the last 8760 hours. HbA1C: No results for input(s): HGBA1C in the last 72 hours. CBG: Recent Labs  Lab 06/09/19 1558 06/09/19 2038 06/10/19 0036 06/10/19 0506 06/10/19 0732  GLUCAP 271* 248* 200* 247* 228*   Lipid Profile: No results for input(s): CHOL, HDL, LDLCALC, TRIG, CHOLHDL, LDLDIRECT in the last 72 hours. Thyroid Function Tests: No results for input(s): TSH, T4TOTAL, FREET4, T3FREE, THYROIDAB in the last 72 hours. Anemia Panel:  No results for input(s): VITAMINB12, FOLATE, FERRITIN, TIBC, IRON, RETICCTPCT in the last 72 hours. Urine analysis:    Component Value Date/Time   COLORURINE YELLOW 05/19/2019 [place] 05/19/2019 1355   APPEARANCEUR Clear 07/23/2017   LABSPEC 1.013 05/19/2019 1355   PHURINE 5.0 05/19/2019 1355   GLUCOSEU NEGATIVE 05/19/2019 1355   GLUCOSEU 500 (A) 07/14/2017 1053   HGBUR SMALL (A) 05/19/2019 1355   BILIRUBINUR NEGATIVE 05/19/2019 1355   BILIRUBINUR Negative 07/23/2017   KETONESUR NEGATIVE 05/19/2019 1355   PROTEINUR NEGATIVE 05/19/2019 1355   UROBILINOGEN 0.2 07/14/2017 1053   NITRITE NEGATIVE 05/19/2019 1355   LEUKOCYTESUR NEGATIVE 05/19/2019 1355   Sepsis Labs: @LABRCNTIP (procalcitonin:4,lacticidven:4)  ) Recent Results (from the past 240 hour(s))  Expectorated sputum assessment w rflx to resp cult     Status:  None   Collection Time: 06/03/19  9:44 PM   Specimen: Expectorated Sputum  Result Value Ref Range Status   Specimen Description EXPECTORATED SPUTUM  Final   Special Requests Normal  Final   Sputum evaluation   Final    Sputum specimen not acceptable for testing.  Please recollect.   RESULT CALLED TO, READ BACK BY AND VERIFIED WITH: H HUNT RN 06/04/19 0100 JDW Performed at [facility], [address] [place], [place] [address]    Report Status 06/05/2019 FINAL  Final  Culture, blood (routine x 2)     Status: None   Collection Time: 06/03/19  9:45 PM   Specimen: BLOOD LEFT ARM  Result Value Ref Range Status   Specimen Description BLOOD LEFT ARM  Final   Special Requests   Final    BOTTLES DRAWN AEROBIC AND ANAEROBIC Blood Culture adequate volume   Culture   Final    NO GROWTH 5 DAYS Performed at [facility], [place] [address] [place], [place] [address]    Report Status 06/08/2019 FINAL  Final  Culture, blood (routine x 2)     Status: None   Collection Time: 06/03/19  9:50 PM   Specimen: BLOOD LEFT HAND  Result Value Ref Range Status   Specimen Description BLOOD LEFT HAND  Final   Special Requests AEROBIC BOTTLE ONLY Blood Culture adequate volume  Final   Culture   Final    NO GROWTH 5 DAYS Performed at [facility], [place] [address], [place],  [address]    Report Status 06/08/2019 FINAL  Final  Expectorated sputum assessment w rflx to resp cult     Status: None   Collection Time: 06/04/19  3:21 AM   Specimen: SPU  Result Value Ref Range Status   Specimen Description SPUTUM  Final   Special Requests NONE  Final   Sputum evaluation   Final    THIS SPECIMEN IS ACCEPTABLE FOR SPUTUM CULTURE Performed at [facility], [place] [address], [place],  [address]    Report Status 06/05/2019 FINAL  Final  Culture, respiratory     Status: None   Collection Time: 06/04/19  3:21 AM   Specimen: SPU  Result Value Ref Range Status   Specimen Description  SPUTUM  Final   Special Requests NONE Reflexed from [id number]  Final   Gram Stain   Final    ABUNDANT WBC PRESENT, PREDOMINANTLY PMN MODERATE GRAM POSITIVE RODS    Culture   Final    ABUNDANT CORYNEBACTERIUM STRIATUM Standardized susceptibility testing for this organism is not available. Performed at [facility], [place]  [address], [place], [place] [address]    Report Status 06/08/2019 FINAL  Final      Studies: No results found.  Scheduled Meds: . sodium chloride   Intravenous Once  . ascorbic acid  500 mg Per Tube Daily  . carvedilol  3.125 mg Per Tube BID WC  . chlorhexidine  15 mL Mouth Rinse BID  . Chlorhexidine Gluconate Cloth  6 each Topical Daily  . feeding supplement (PRO-STAT SUGAR FREE 64)  30 mL Per Tube BID  . fluticasone  2 spray Each Nare Daily  . fluticasone furoate-vilanterol  1 puff Inhalation Daily  . free water  200 mL Per Tube Q4H  . furosemide  20 mg Intravenous Q12H  . insulin aspart  0-20 Units Subcutaneous Q4H  . insulin aspart  4 Units Subcutaneous Q4H  . insulin glargine  20 Units Subcutaneous Daily  . liver oil-zinc oxide   Topical QID  . loratadine  10 mg Per Tube QPM  . losartan  12.5 mg Per Tube Daily  . mouth rinse  15 mL Mouth Rinse q12n4p  . multivitamin with minerals  1 tablet Per Tube Daily  . pantoprazole sodium  40 mg Per Tube Daily  . simvastatin  20 mg Per Tube q morning - 10a  . sodium chloride flush  10-40 mL Intracatheter Q12H  . sodium chloride flush  3 mL Intravenous Q12H  . tamsulosin  0.4 mg Oral QPC breakfast  . torsemide  20 mg Per Tube q1800  . umeclidinium bromide  1 puff Inhalation Daily  . Vilazodone HCl  40 mg Oral Daily    Continuous Infusions: . sodium chloride 250 mL (06/03/19 2141)  . feeding supplement (OSMOLITE 1.5 CAL) 1,000 mL (06/07/19 0849)     LOS: 35 days     Darlin Drop, MD [facility] Pager [phone]  If 7PM-7AM, please contact night-coverage [email or web address] Password [email or web address]  06/10/2019, 10:32 AM

## 2019-06-10 NOTE — TOC Progression Note (Signed)
Transition of Care [facility]) - Progression Note    Patient Details  Name: Cody Hale MRN: [id number] Date of Birth: 1951-06-13  Transition of Care [facility]) CM/SW [place], [place] Phone Number: 06/10/2019, 1:57 PM  Clinical Narrative:     CSW spoke with patient's daughter in law, Cody Hale( [name]) and provided bed offers. CSW requested [name] return call  with top 3 SNF choices.   Thurmond Butts, MSW, [facility] Clinical Social Worker [phone]   Expected Discharge Plan: Skilled Nursing Facility Barriers to Discharge: Continued Medical Work up  Expected Discharge Plan and Services Expected Discharge Plan: [place] arrangements for the past 2 months: Single Family Home                                       Social Determinants of Health (SDOH) Interventions    Readmission Risk Interventions No flowsheet data found.

## 2019-06-11 LAB — BASIC METABOLIC PANEL
Anion gap: 9 (ref 5–15)
BUN: 35 mg/dL — ABNORMAL HIGH (ref 8–23)
CO2: 30 mmol/L (ref 22–32)
Calcium: 8.2 mg/dL — ABNORMAL LOW (ref 8.9–10.3)
Chloride: 99 mmol/L (ref 98–111)
Creatinine, Ser: 0.78 mg/dL (ref 0.61–1.24)
GFR calc Af Amer: >60 mL/min (ref >60)
GFR calc non Af Amer: >60 mL/min (ref >60)
Glucose, Bld: 264 mg/dL — ABNORMAL HIGH (ref 70–99)
Potassium: 4.5 mmol/L (ref 3.5–5.1)
Sodium: 138 mmol/L (ref 135–145)

## 2019-06-11 LAB — CBC
HCT: 31.4 % — ABNORMAL LOW (ref 39.0–52.0)
Hemoglobin: 9.3 g/dL — ABNORMAL LOW (ref 13.0–17.0)
MCH: 28.1 pg (ref 26.0–34.0)
MCHC: 29.6 g/dL — ABNORMAL LOW (ref 30.0–36.0)
MCV: 94.9 fL (ref 80.0–100.0)
Platelets: 184 10*3/uL (ref 150–400)
RBC: 3.31 MIL/uL — ABNORMAL LOW (ref 4.22–5.81)
RDW: 17.5 % — ABNORMAL HIGH (ref 11.5–15.5)
WBC: 11.3 10*3/uL — ABNORMAL HIGH (ref 4.0–10.5)
nRBC: 0 % (ref 0.0–0.2)

## 2019-06-11 LAB — GLUCOSE, CAPILLARY
Glucose-Capillary: 178 mg/dL — ABNORMAL HIGH (ref 70–99)
Glucose-Capillary: 214 mg/dL — ABNORMAL HIGH (ref 70–99)
Glucose-Capillary: 222 mg/dL — ABNORMAL HIGH (ref 70–99)
Glucose-Capillary: 242 mg/dL — ABNORMAL HIGH (ref 70–99)
Glucose-Capillary: 252 mg/dL — ABNORMAL HIGH (ref 70–99)
Glucose-Capillary: 266 mg/dL — ABNORMAL HIGH (ref 70–99)
Glucose-Capillary: 272 mg/dL — ABNORMAL HIGH (ref 70–99)

## 2019-06-11 LAB — MAGNESIUM: Magnesium: 2.2 mg/dL (ref 1.7–2.4)

## 2019-06-11 MED ORDER — BUPIVACAINE HCL (PF) 0.25 % IJ SOLN
INTRAMUSCULAR | Status: AC
  Filled 2019-06-11: qty 30

## 2019-06-11 MED ORDER — OSMOLITE 1.5 CAL PO LIQD
474.0000 mL | Freq: Three times a day (TID) | ORAL | Status: DC
  Administered 2019-06-11: 120 mL
  Administered 2019-06-11 – 2019-06-18 (×17): 474 mL
  Filled 2019-06-11 (×2): qty 1000
  Filled 2019-06-11 (×4): qty 474
  Filled 2019-06-11: qty 1000
  Filled 2019-06-11: qty 474
  Filled 2019-06-11: qty 1000
  Filled 2019-06-11: qty 474
  Filled 2019-06-11 (×2): qty 1000
  Filled 2019-06-11 (×3): qty 474
  Filled 2019-06-11 (×2): qty 1000
  Filled 2019-06-11: qty 474
  Filled 2019-06-11 (×5): qty 1000
  Filled 2019-06-11: qty 474

## 2019-06-11 MED ORDER — SULFAMETHOXAZOLE-TRIMETHOPRIM 800-160 MG PO TABS
1.0000 | ORAL_TABLET | Freq: Two times a day (BID) | ORAL | Status: DC
  Administered 2019-06-11 – 2019-06-18 (×14): 1
  Filled 2019-06-11 (×15): qty 1

## 2019-06-11 MED ORDER — SULFAMETHOXAZOLE-TRIMETHOPRIM 800-160 MG PO TABS
1.0000 | ORAL_TABLET | Freq: Two times a day (BID) | ORAL | Status: DC
  Filled 2019-06-11 (×2): qty 1

## 2019-06-11 MED ORDER — PRO-STAT SUGAR FREE PO LIQD
30.0000 mL | Freq: Two times a day (BID) | ORAL | Status: DC
  Administered 2019-06-11 – 2019-06-18 (×12): 30 mL
  Filled 2019-06-11 (×12): qty 30

## 2019-06-11 NOTE — Progress Notes (Signed)
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 Remained intubated until 07/25/18 PMHx: Afib, MVR, SVT, anxiety, CAD, COPD, CHF, HTN,    OT comments  Pt OOB to chair this session with heavy total +2 max (A) and recommend RN staff hoyer lift to chair. Pt lethargic despite no medications prior to session. Pt demonstrates cognitive deficits and benefits from reorientation as patient was able to hold onto information for speech session (today is Friday). Pt could benefit from hoyer lift daily by RN staff to help with arousal.    Follow Up Recommendations  SNF;Supervision/Assistance - 24 hour    Equipment Recommendations  3 in 1 bedside commode;Wheelchair (measurements OT);Wheelchair cushion (measurements OT);Hospital bed    Recommendations for Other Services      Precautions / Restrictions Precautions Precautions: Back;Fall Precaution Comments: no awareness to back preacutions Required Braces or Orthoses: Spinal Brace Spinal Brace: Thoracolumbosacral orthotic;Applied in sitting position       Mobility Bed Mobility Overal bed mobility: Needs Assistance Bed Mobility: Rolling;Supine to Sit Rolling: Max assist;+2 for physical assistance   Supine to sit: +2 for physical assistance;Max assist     General bed mobility comments: pt attempting to push with R UE and initiates bringing bil LE toward EOB. once eob total (A) for balance  Transfers Overall transfer level:  Needs assistance Equipment used: 2 person hand held assist Transfers: Squat Pivot Transfers Sit to Stand: Max assist;+2 safety/equipment   Squat pivot transfers: Max assist;+2 safety/equipment     General transfer comment: pt requires pad for hip extension and swing hips to chair with R LE blocked. pt with lift pad placed for RN staff to lift back to bed. pt engaged in selecting tv program to attempt to keep patient aroused and engaged    Balance Overall balance assessment: Needs assistance Sitting-balance support: Bilateral upper extremity supported;Feet supported Sitting balance-Leahy Scale: Poor Sitting balance - Comments: posterior lean and pushing posteriorly   Standing balance support: Bilateral upper extremity supported Standing balance-Leahy Scale: Poor Standing balance comment: chair placed in front of patient for full upright posture and bil Ue support. pt requires blocking of R LE for full knee extension. pt with hip flexion into to chair. pt is able to stand and say abc song all the way through prior to sit. pt completed x3 sit>Stand this sesssion                           ADL either performed or assessed with clinical judgement   ADL Overall ADL's : Needs assistance/impaired Eating/Feeding: NPO   Grooming: Maximal assistance   Upper Body Bathing: Maximal assistance   Lower Body Bathing: Total assistance   Upper Body Dressing : Maximal assistance       Toilet Transfer: +2 for physical assistance;+2 for safety/equipment;Maximal assistance Toilet Transfer Details (indicate cue type and reason): simulated EOB to chair with heavy max (A) from therapsit            General ADL Comments:  pt needs encouragement to keep pushing during session more due to fatigue than from any pain complaints. pt noted to have drainage from incision.      Vision       Perception     Praxis      Cognition Arousal/Alertness: Lethargic Behavior During Therapy: Flat  affect Overall Cognitive Status: Impaired/Different from baseline Area of Impairment: Orientation;Attention;Memory;Following commands;Safety/judgement;Awareness                 Orientation Level: Disoriented to;Place;Time Current Attention Level: Sustained Memory: Decreased recall of precautions;Decreased short-term memory Following Commands: Follows one step commands inconsistently;Follows one step commands with increased time   Awareness: Intellectual Problem Solving: Slow processing General Comments: RN holding all medications prior to session due to patient lethargic all day . pt with only complaints of pain during application of TLSO. pt needs name call to keep aroused at times. pt agreeable to all task but demonstrates sequencing deficits        Exercises     Shoulder Instructions       General Comments      Pertinent Vitals/ Pain       Pain Assessment: Faces Faces Pain Scale: Hurts little more Pain Location: over all generalized Pain Descriptors / Indicators: Operative site guarding Pain Intervention(s): Monitored during session;Repositioned  Home Living                                          Prior Functioning/Environment              Frequency  Min 2X/week        Progress Toward Goals  OT Goals(current goals can now be found in the care plan section)  Progress towards OT goals: Progressing toward goals  Acute Rehab OT Goals Patient Stated Goal: none stated OT Goal Formulation: With patient Time For Goal Achievement: 06/25/19 Potential to Achieve Goals: Good ADL Goals Pt Will Perform Grooming: with min assist;sitting Pt Will Perform Upper Body Dressing: with min assist;sitting Pt Will Transfer to Toilet: stand pivot transfer;bedside commode;with mod assist Pt/caregiver will Perform Home Exercise Program: Both right and left upper extremity;Increased strength;With minimal assist;With written HEP provided Additional ADL Goal  #1: Pt will follow 100% commands in 4/6 trials with 90% accuracy.  Plan Discharge plan remains appropriate    Co-evaluation    PT/OT/SLP Co-Evaluation/Treatment: Yes Reason for Co-Treatment: Complexity of the patient's impairments (multi-system involvement);Necessary to address cognition/behavior during functional activity;For patient/therapist safety;To address functional/ADL transfers   OT goals addressed during session: ADL's and self-care;Proper use of Adaptive equipment and DME;Strengthening/ROM      AM-PAC OT "6 Clicks" Daily Activity     Outcome Measure   Help from another person eating meals?: Total Help from another person taking care of personal grooming?: Total Help from another person toileting, which includes using toliet, bedpan, or urinal?: Total Help from another person bathing (including washing, rinsing, drying)?: Total Help from another person to put on and taking off regular upper body clothing?: Total Help from another person to put on and taking off regular lower body clothing?: Total 6 Click Score: 6    End of Session Equipment Utilized During Treatment: Back brace  OT Visit Diagnosis: Unsteadiness on feet (R26.81);Muscle weakness (generalized) (M62.81);Pain   Activity Tolerance Patient limited by lethargy;Patient limited by pain   Patient Left in chair;with call bell/phone within reach;with chair alarm set  Nurse Communication Mobility status;Precautions        Time: 4818-5631 OT Time Calculation (min): 31 min  Charges: OT General Charges $OT Visit: 1 Visit OT Treatments $Therapeutic Activity: 8-22 mins   Brynn, OTR/L  Acute Rehabilitation Services Pager: [phone] Office: [phone] .    Mateo Flow 06/11/2019, 3:42 PM

## 2019-06-11 NOTE — Progress Notes (Signed)
Inpatient Diabetes Program Recommendations  AACE/ADA: New Consensus Statement on Inpatient Glycemic Control (2015)  Target Ranges:  Prepandial:   less than 140 mg/dL      Peak postprandial:   less than 180 mg/dL (1-2 hours)      Critically ill patients:  140 - 180 mg/dL   Lab Results  Component Value Date   GLUCAP 252 (H) 06/11/2019   HGBA1C 7.4 (H) 03/24/2019    Review of Glycemic Control Results for Cody Hale, Cody "DAVID" (MRN [id number]) as of 06/11/2019 12:18  Ref. Range 06/10/2019 15:14 06/10/2019 19:51 06/11/2019 00:08 06/11/2019 04:24 06/11/2019 08:38  Glucose-Capillary Latest Ref Range: 70 - 99 mg/dL 314 (H) 231 (H) 272 (H) 242 (H) 252 (H)   Diabetes history: DM 2 Outpatient Diabetes medications:  Trulicity 1.5 mg weekly, Novolog 5 units tid with meals, Tresiba 50 units daily Current orders for Inpatient glycemic control:  Novolog resistant q 4 hours Osmolite 60 cc/hr Novolog 4 units q 4 hours Lantus 20 units daily  Inpatient Diabetes Program Recommendations:    Consider increasing Lantus to 30 units daily and increasing Novolog tube feed coverage to 6 units q 4 hours.   Thanks  Adah Perl, RN, BC-ADM Inpatient Diabetes Coordinator Pager [phone] (8a-5p)

## 2019-06-11 NOTE — Progress Notes (Signed)
Nutrition Follow-up  DOCUMENTATION CODES:   Obesity unspecified  INTERVENTION:  Stop current continuous tube feeds for transition to bolus feeds.  At 1800, start bolus tube feeds using Osmolite 1.5 formula via PEG at starting volume of 120 ml (half carton/ARC) and increase by 120 ml at each feeding until goal volume of 474 ml (2 cartons/ARCs) given TID.   Provide 30 ml Prostat BID per tube.   Free water flushes of 200 ml every 4 hours per tube. (MD to adjust as appropriate)  Tube feeding regimen to provide 2333 kcal (100% of needs), 119 grams of protein, and 2281 ml free water.  NUTRITION DIAGNOSIS:   Increased nutrient needs related to wound healing, acute illness as evidenced by estimated needs; ongoing  GOAL:   Patient will meet greater than or equal to 90% of their needs; met with TF  MONITOR:   Diet advancement, Labs, Weight trends, TF tolerance, Skin, I & O's  REASON FOR ASSESSMENT:   Consult Enteral/tube feeding initiation and management  ASSESSMENT:   68 y.o. 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  Patient fell onto his buttock.Patient is being admitted for further management of lumbar fracture.  10/29 admit, Covid+ 11/13 Cortrak tube placed 11/17 ORIF of L1-L2; multilevel spine stabilization from T11-L4 11/27- s/p g-tube placement  12/2- Underwent MBS, pt with silent aspiration, severe aspiration risk, continued NPO status  Pt has been tolerating his tube feeding well. RD to adjust tube feeding and transition to bolus feeds via PEG. RD to continue to monitor for tolerance.   Labs and medications reviewed.   Diet Order:   Diet Order            Diet NPO time specified Except for: Sips with Meds  Diet effective midnight              EDUCATION NEEDS:   No  education needs have been identified at this time  Skin:  Skin Assessment: Skin Integrity Issues: Skin Integrity Issues:: Stage II Stage II: buttocks Incisions: closed back  Last BM:  12/4  Height:   Ht Readings from Last 1 Encounters:  05/06/19 _0  (1.753 m)    Weight:   Wt Readings from Last 1 Encounters:  06/10/19 117.4 kg    Ideal Body Weight:  72.7 kg  BMI:  Body mass index is 38.22 kg/m.  Estimated Nutritional Needs:   Kcal:  2200-2400  Protein:  115-130 grams  Fluid:  > 2 L    Corrin Parker, MS, RD, LDN Pager # [phone] After hours/ weekend pager # [phone]

## 2019-06-11 NOTE — TOC Progression Note (Addendum)
Transition of Care [facility]) - Progression Note    Patient Details  Name: Cody Hale MRN: [id number] Date of Birth: [birth year]  Transition of Care [facility]) CM/SW [place], [place] Phone Number: 06/11/2019, 5:23 PM  Clinical Narrative:     Patient's son called and selected [place]. CSW contacted [facility] to confirm bed offer. CSW waiting on response.  CSW will continue to follow and assist with discharge planning.  Thurmond Butts, MSW, [facility] Clinical Social Worker [phone]    Expected Discharge Plan: Skilled Nursing Facility Barriers to Discharge: Continued Medical Work up  Expected Discharge Plan and Services Expected Discharge Plan: [place] arrangements for the past 2 months: Single Family Home                                       Social Determinants of Health (SDOH) Interventions    Readmission Risk Interventions No flowsheet data found.

## 2019-06-11 NOTE — Progress Notes (Signed)
PROGRESS NOTE  Cody Hale LXB:[id number] DOB: Mar 01, 1951 DOA: 05/06/2019 PCP: [name], MD  HPI/Recap of past 52 hours: 68 year old with history of combined systolic and diastolic CHF, bioprosthetic mitral valve replacement endocarditis 2014, A. fib, COPD, diabetes presented with fall at home initially from the buttock.  Had any pain ER MRI showed unstable lumbar fracture transferred to [facility].  He was tested positive for Covid.  Denied any shortness of breath with minimal productive cough.  Neurosurgery was consulted.  Hospital course further complicated by GBS bacteremia on antibiotics.  After spinal fusion it was difficult to ventilate him requiring him to be intubated.  Drain was placed by neurosurgery.  Echocardiogram showed new onset CHF with EF of 45%.  Also had brief run of V. tach and A. fib.  Further complicated by incidental finding of retroperitoneal hematoma.  Heparin drip was stopped.  IR placed PEG tube on 11/27.   PT OT recommended SNF.  CSW assisting with placement.  06/11/19: Patient was seen and examined at his bedside this morning.  No acute events overnight.  T-max 99.6 this morning. Bedside RN noted serosanguineous drainage from back wound. Checked by neurosurgery does not appear overtly infected, started on bactrim DS BID x 10 days. Will follow up Monday for wound check and for possible staples removal.  Assessment/Plan: Active Problems:   OSA (obstructive sleep apnea)   Type II diabetes mellitus with manifestations (HCC)   Chronic systolic CHF (congestive heart failure) (HCC)   Hyperlipidemia with target LDL less than 70   Chronic atrial fibrillation   Chronic respiratory failure with hypoxia (HCC)   Unstable burst fracture of unspecified lumbar vertebra, initial encounter for closed fracture (HCC)   Closed unstable burst fracture of lumbar vertebra (HCC)   Closed unstable burst fracture of first lumbar vertebra (HCC)   Pressure injury of skin  Bacteremia   Hyponatremia   NSVT (nonsustained ventricular tachycardia) (HCC)   Retroperitoneal hematoma affecting gluteus musculature bilaterally -Incidental finding on the scan which was done prior to PEG tube placement.  Currently off anticoagulation.  Monitor hemoglobin, transfuse as necessary.  No surgical intervention. Hemoglobin is stable this morning 9.3 on 06/11/2019 from 8.7 on 06/09/2019. No sign of overt bleeding. Continue to hold off anticoagulation for now.  T11/L4 fusion after her lumbar fracture, postop day 16. -Seen by neurosurgery.  Pain management.  Bowel control. Seen by PT OT with recommendation for SNF. CSW assisting with SNF placement.  Bedside RN noted serosanguineous drainage from back wound. Checked by neurosurgery does not appear overtly infected, started on bactrim DS BID x 10 days. Will follow up Monday for wound check and for possible staples removal.  Healthcare acquired pneumonia, Corynebacterium -Treated with 5 days of cefepime.  Vancomycin discontinued 12/2. -Clinically followed.  Bronchodilators as needed. Completed treatment. Clinically improved  Dysphagia requiring PEG tube placement 06/04/2019 -Continue aspiration precaution.  PEG tube feeding per nutrition team. Continue PEG tube feedings with medications via tube  Mild acute hypoxia -Suspect some silent aspiration.  Supportive care.  As needed bronchodilators. Maintain O2 saturation greater than 92% Home O2 evaluation for DC planning  COVID-19 pneumonia, resolved -Supportive care.  Previously treated with steroids, remdesivir, bronchodilators  Chronic atrial fibrillation -No anticoagulation due to retroperitoneal bleed.  On Coreg 3.125 mg twice daily for rate control. Rate is controlled on Coreg  Chronic systolic congestive heart failure Continue Coreg Hold off losartan due to soft blood pressure Continue torsemide to maintain euvolemia   Ejection fraction  40-45% on the echo  performed on 11/12.  Diabetes mellitus type 2, hyperglycemia -Holding outpatient medication.  Continue Lantus to 20 units.    Increase NovoLog dose to 4 units every 4 hours with continuous tube feeding   GERD -PPI  Hyperlipidemia -Statin  DVT prophylaxis:  SCDs Code Status: Full code Family Communication: None Disposition Plan:  Possible discharge to SNF within the next 24 to 48 hours.  Pending bed placement.  Consultants:   Neurosurgery  PCCM  Cardiology  Dementia radiology  Procedures:   PEG tube placement by interventional radiology.   Objective: Vitals:   06/11/19 0839 06/11/19 0922 06/11/19 1200 06/11/19 1201  BP: (!) 115/49  103/72   Pulse: 90  79 68  Resp: 20  (!) 26 16  Temp: 98.6 F (37 C)  98.7 F (37.1 C)   TempSrc: Oral  Oral   SpO2: 100% 97% 96% 98%  Weight:      Height:        Intake/Output Summary (Last 24 hours) at 06/11/2019 1535 Last data filed at 06/11/2019 0300 Gross per 24 hour  Intake -  Output 2000 ml  Net -2000 ml   Filed Weights   06/08/19 0500 06/09/19 0500 06/10/19 0500  Weight: 116.5 kg 119 kg 117.4 kg    Exam:  . General: 68 y.o. year-old male obese in no acute distress.  Alert and interactive.   . Cardiovascular: Irregular rate and rhythm no rubs or gallops.  Respiratory: Mild rales bilateral.  No wheezing noted.   Abdomen: Obese bowel sounds present.  PEG tube in place.  Musculoskeletal: Trace lower extremity edema bilaterally.   Psychiatry: Mood is appropriate for condition and setting.  Data Reviewed: CBC: Recent Labs  Lab 06/06/19 0335 06/06/19 1227 06/07/19 0525 06/09/19 0500 06/10/19 0456 06/11/19 0622  WBC 10.3  --  10.0 11.9* 13.7* 11.3*  HGB 7.7* 8.7* 8.8* 8.7* 9.1* 9.3*  HCT 26.5* 28.8* 30.0* 29.5* 30.1* 31.4*  MCV 96.0  --  95.5 95.2 93.2 94.9  PLT 277  --  234 209 199 184   Basic Metabolic Panel: Recent Labs  Lab 06/07/19 1611 06/08/19 0500 06/09/19 0500 06/10/19 0456 06/11/19 0622   NA 148* 145 144 140 138  K 4.3 4.2 4.3 4.6 4.5  CL 110 105 104 101 99  CO2 27 27 30 28 30   GLUCOSE 300* 293* 297* 265* 264*  BUN 28* 28* 32* 31* 35*  CREATININE 0.77 0.68 0.85 0.70 0.78  CALCIUM 8.3* 8.2* 8.2* 8.2* 8.2*  MG 2.4  --   --  2.0 2.2   GFR: Estimated Creatinine Clearance: 111.8 mL/min (by C-G formula based on SCr of 0.78 mg/dL). Liver Function Tests: No results for input(s): AST, ALT, ALKPHOS, BILITOT, PROT, ALBUMIN in the last 168 hours. No results for input(s): LIPASE, AMYLASE in the last 168 hours. No results for input(s): AMMONIA in the last 168 hours. Coagulation Profile: No results for input(s): INR, PROTIME in the last 168 hours. Cardiac Enzymes: No results for input(s): CKTOTAL, CKMB, CKMBINDEX, TROPONINI in the last 168 hours. BNP (last 3 results) No results for input(s): PROBNP in the last 8760 hours. HbA1C: No results for input(s): HGBA1C in the last 72 hours. CBG: Recent Labs  Lab 06/10/19 1951 06/11/19 0008 06/11/19 0424 06/11/19 0838 06/11/19 1315  GLUCAP 231* 272* 242* 252* 266*   Lipid Profile: No results for input(s): CHOL, HDL, LDLCALC, TRIG, CHOLHDL, LDLDIRECT in the last 72 hours. Thyroid Function Tests: No results for input(s):  TSH, T4TOTAL, FREET4, T3FREE, THYROIDAB in the last 72 hours. Anemia Panel: No results for input(s): VITAMINB12, FOLATE, FERRITIN, TIBC, IRON, RETICCTPCT in the last 72 hours. Urine analysis:    Component Value Date/Time   COLORURINE YELLOW 05/19/2019 1355   APPEARANCEUR CLEAR 05/19/2019 1355   APPEARANCEUR Clear 07/23/2017   LABSPEC 1.013 05/19/2019 1355   PHURINE 5.0 05/19/2019 1355   GLUCOSEU NEGATIVE 05/19/2019 1355   GLUCOSEU 500 (A) 07/14/2017 1053   HGBUR SMALL (A) 05/19/2019 1355   BILIRUBINUR NEGATIVE 05/19/2019 1355   BILIRUBINUR Negative 07/23/2017   KETONESUR NEGATIVE 05/19/2019 1355   PROTEINUR NEGATIVE 05/19/2019 1355   UROBILINOGEN 0.2 07/14/2017 1053   NITRITE NEGATIVE 05/19/2019 1355    LEUKOCYTESUR NEGATIVE 05/19/2019 1355   Sepsis Labs: @LABRCNTIP (procalcitonin:4,lacticidven:4)  ) Recent Results (from the past 240 hour(s))  Expectorated sputum assessment w rflx to resp cult     Status: None   Collection Time: 06/03/19  9:44 PM   Specimen: Expectorated Sputum  Result Value Ref Range Status   Specimen Description EXPECTORATED SPUTUM  Final   Special Requests Normal  Final   Sputum evaluation   Final    Sputum specimen not acceptable for testing.  Please recollect.   RESULT CALLED TO, READ BACK BY AND VERIFIED WITH: H HUNT RN 06/04/19 0100 JDW Performed at [facility], [address] [place], [place] [address]    Report Status 06/05/2019 FINAL  Final  Culture, blood (routine x 2)     Status: None   Collection Time: 06/03/19  9:45 PM   Specimen: BLOOD LEFT ARM  Result Value Ref Range Status   Specimen Description BLOOD LEFT ARM  Final   Special Requests   Final    BOTTLES DRAWN AEROBIC AND ANAEROBIC Blood Culture adequate volume   Culture   Final    NO GROWTH 5 DAYS Performed at [facility], [address] [place], [place] [address]    Report Status 06/08/2019 FINAL  Final  Culture, blood (routine x 2)     Status: None   Collection Time: 06/03/19  9:50 PM   Specimen: BLOOD LEFT HAND  Result Value Ref Range Status   Specimen Description BLOOD LEFT HAND  Final   Special Requests AEROBIC BOTTLE ONLY Blood Culture adequate volume  Final   Culture   Final    NO GROWTH 5 DAYS Performed at [facility], [address] [place], [place] [address]    Report Status 06/08/2019 FINAL  Final  Expectorated sputum assessment w rflx to resp cult     Status: None   Collection Time: 06/04/19  3:21 AM   Specimen: SPU  Result Value Ref Range Status   Specimen Description SPUTUM  Final   Special Requests NONE  Final   Sputum evaluation   Final    THIS SPECIMEN IS ACCEPTABLE FOR SPUTUM CULTURE Performed at [facility], [address]  [place], [place] [address]    Report Status 06/05/2019 FINAL  Final  Culture, respiratory     Status: None   Collection Time: 06/04/19  3:21 AM   Specimen: SPU  Result Value Ref Range Status   Specimen Description SPUTUM  Final   Special Requests NONE Reflexed from [id number]  Final   Gram Stain   Final    ABUNDANT WBC PRESENT, PREDOMINANTLY PMN MODERATE GRAM POSITIVE RODS    Culture   Final    ABUNDANT CORYNEBACTERIUM STRIATUM Standardized susceptibility testing for this organism  is not available. Performed at [facility], [place] [address], [place], [place] [address]    Report Status 06/08/2019 FINAL  Final      Studies: No results found.  Scheduled Meds: . sodium chloride   Intravenous Once  . ascorbic acid  500 mg Per Tube Daily  . carvedilol  3.125 mg Per Tube BID WC  . chlorhexidine  15 mL Mouth Rinse BID  . Chlorhexidine Gluconate Cloth  6 each Topical Daily  . feeding supplement (OSMOLITE 1.5 CAL)  474 mL Per Tube TID  . feeding supplement (PRO-STAT SUGAR FREE 64)  30 mL Per Tube BID  . fluticasone  2 spray Each Nare Daily  . fluticasone furoate-vilanterol  1 puff Inhalation Daily  . free water  200 mL Per Tube Q4H  . insulin aspart  0-20 Units Subcutaneous Q4H  . insulin aspart  4 Units Subcutaneous Q4H  . insulin glargine  20 Units Subcutaneous Daily  . liver oil-zinc oxide   Topical QID  . loratadine  10 mg Per Tube QPM  . mouth rinse  15 mL Mouth Rinse q12n4p  . multivitamin with minerals  1 tablet Per Tube Daily  . pantoprazole sodium  40 mg Per Tube Daily  . simvastatin  20 mg Per Tube q morning - 10a  . sodium chloride flush  10-40 mL Intracatheter Q12H  . sodium chloride flush  3 mL Intravenous Q12H  . sulfamethoxazole-trimethoprim  1 tablet Oral Q12H  . tamsulosin  0.4 mg Oral QPC breakfast  . torsemide  20 mg Per Tube q1800  . umeclidinium bromide  1 puff Inhalation Daily  . Vilazodone HCl  40 mg Oral Daily    Continuous Infusions: . sodium chloride  250 mL (06/03/19 2141)     LOS: 36 days     Kayleen Memos, MD [facility] Pager [phone]  If 7PM-7AM, please contact night-coverage [email or web address] Password [email or web address] 06/11/2019, 3:35 PM

## 2019-06-11 NOTE — Progress Notes (Signed)
  Speech Language Pathology Treatment: Dysphagia  Patient Details Name: Cody Hale MRN: [id number] DOB: [birth year] Today's Date: 06/11/2019 Time: 4098-1191 SLP Time Calculation (min) (ACUTE ONLY): 12 min  Assessment / Plan / Recommendation Clinical Impression  Pt found up in chair with mild amount of dry secretions on hard palate removed with toothette. Pt is a silent aspirator as seen on MBS 12/2. He consumed teaspoon sips water with weak appearing swallow and delayed. SLP verbally cued pt to try and swallow hard and fast. He swallowed approximately 3 times after each trial coresponding to residue viewed during MBS. Having pt swallow teaspoon amounts of water and/or ice will assist in keepong his musculature in use more than just swallowing his saliva. This should be given only after oral care, 4-5 teaspoon size amounts water, or separate ice chips with full supervision every 2-3 hours a shift.    HPI HPI: Cody Hale is a 68 y.o. year old male with medical history significant for combined systolic/diastolic CHF, prosthetic mitral valve replacement with history of endocarditis in 2014, atrial fibrillation, COPD, diabetes who presented on 05/06/2019 with buttocks and back pain after a mechanical fall at home and was found to have unstable lumbar fracture and was transferred from any pain hospital to [facility]. Hospital course complicated by [id number] infection positive with no symptoms though he did complete 5 days of remdesivir and full course of heparin drip GBS bacteremia IV penicillin. Had FEES 11/19- difficulty viewing anatomy sufficiently- NPO recommended. Repeat instrumental wi MBS.        SLP Plan  Continue with current plan of care       Recommendations  Diet recommendations: Other(comment)(ice chips or SPOON sips water several times a shift after or) Liquids provided via: Teaspoon Medication Administration: Via alternative means                Oral Care Recommendations: Oral  care QID Follow up Recommendations: Skilled Nursing facility SLP Visit Diagnosis: Dysphagia, oropharyngeal phase (R13.12) Plan: Continue with current plan of care                       [name] 06/11/2019, 2:34 PM  [name].Ed Risk analyst [phone] Office [phone]

## 2019-06-11 NOTE — TOC Progression Note (Signed)
Transition of Care [facility]) - Progression Note    Patient Details  Name: Cody Hale MRN: [id number] Date of Birth: 11-17-1950  Transition of Care [facility]) CM/SW [place], [place] Phone Number: 06/11/2019, 3:13 PM  Clinical Narrative:     CSW called patient's son, [name], left voice message to return call.  Thurmond Butts, MSW, [facility] Clinical Social Worker [phone]   Expected Discharge Plan: Skilled Nursing Facility Barriers to Discharge: Continued Medical Work up  Expected Discharge Plan and Services Expected Discharge Plan: [place] arrangements for the past 2 months: Single Family Home                                       Social Determinants of Health (SDOH) Interventions    Readmission Risk Interventions No flowsheet data found.

## 2019-06-11 NOTE — Progress Notes (Signed)
  NEUROSURGERY PROGRESS NOTE   Called by Dr [place] Crane with [facility] for a wound check.Patient is post op day 16 from T11-L4 fusion for L1-L2 chance type fracture.  Noted by nursing of bloody serosanguinous drainage. Came by to evaluate the patient. Tmax 99.6 yesterday, but otherwise normal.  On exam, staples are still in place. Incision is well approximated. At the superior aspect of the wound there is some redness and bruising that extends <25% down the incision. There is a small amount of skin irritation and peeling skin where the staples are superior aspect. No significant tenderness, warmth. No streaking noted. No active drainage. Small amount of bloody serosanguinous drainage on bed sheet.  A/P: Post day #17 T11-L4 fusion. Wound does not appear overtly infected. Question if area of redness is more from positioning and constant pressure in that area. Will cover for superficial infection with Bactrim DS BID x10days. Will follow up Monday for wound check. At that time, will remove staples.

## 2019-06-11 NOTE — Progress Notes (Signed)
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 Remained intubated until 07/25/18 PMHx: Afib, MVR, SVT, anxiety, CAD, COPD, CHF, HTN,     PT Comments    Pt still lethargic on arrival, but he perked up with warm up ROM.  Not quite as painful in right LE today.  Emphasis on transition to EOB, sitting balance/tolerance while donning the TLSO, sit to stand and transfer to the recliner.    Follow Up Recommendations  SNF     Equipment Recommendations  Other (comment)    Recommendations for Other Services       Precautions / Restrictions Precautions Precautions: Back;Fall Precaution Comments: no awareness to back preacutions Required Braces or Orthoses: Spinal Brace Spinal Brace: Thoracolumbosacral orthotic;Applied in sitting position    Mobility  Bed Mobility Overal bed mobility: Needs Assistance Bed Mobility: Rolling;Supine to Sit Rolling: Max assist;+2 for physical assistance   Supine to sit: +2 for physical assistance;Max assist     General bed mobility comments: pt attempting to push with R UE and initiates bringing bil LE toward EOB. once eob total (A) for balance  Transfers Overall transfer level: Needs assistance Equipment used: 2 person hand held assist Transfers: Squat Pivot Transfers Sit to Stand: Max assist;+2 safety/equipment   Squat pivot transfers: Max assist;+2 safety/equipment     General transfer comment: pt requires pad for hip extension  and swing hips to chair with R LE blocked. pt with lift pad placed for RN staff to lift back to bed. pt engaged in selecting tv program to attempt to keep patient aroused and engaged  Ambulation/Gait             General Gait Details: unable   Stairs             Wheelchair Mobility    Modified Rankin (Stroke Patients Only)       Balance Overall balance assessment: Needs assistance Sitting-balance support: Bilateral upper extremity supported;Feet supported Sitting balance-Leahy Scale: Poor Sitting balance - Comments: posterior lean and pushing posteriorly   Standing balance support: Bilateral upper extremity supported Standing balance-Leahy Scale: Poor Standing balance comment: chair placed in front of patient for full upright posture and bil Ue support. pt requires blocking of R LE for full knee extension. pt with hip flexion into to chair. pt is able to stand and say abc song all the way through prior to sit. pt completed x3 sit>Stand this sesssion                            Cognition Arousal/Alertness: Lethargic Behavior During Therapy: Flat affect Overall Cognitive Status: Impaired/Different from baseline Area of Impairment: Orientation;Attention;Memory;Following commands;Safety/judgement;Awareness                 Orientation Level: Disoriented to;Place;Time Current Attention Level: Sustained Memory: Decreased recall of precautions;Decreased short-term memory Following Commands: Follows one step commands inconsistently;Follows one step commands with increased time   Awareness: Intellectual Problem Solving: Slow processing General Comments: RN holding all  medications prior to session due to patient lethargic all day . pt with only complaints of pain during application of TLSO. pt needs name call to keep aroused at times. pt agreeable to all task but demonstrates sequencing deficits      Exercises Other Exercises Other Exercises: P/AAROM bil  LE in hip/knee flex/ext and bil hip add/IR with stretch for better positioning when get to EOB next treatment Other Exercises: Active shoulder flexion ROM of bil UE's    General Comments General comments (skin integrity, edema, etc.): pt tolerated over an e chair and with new brace trimming and soft cloth under axilla, pt did not complain of pain.      Pertinent Vitals/Pain Pain Assessment: Faces Faces Pain Scale: Hurts little more Pain Location: over all generalized Pain Descriptors / Indicators: Operative site guarding Pain Intervention(s): Monitored during session    Home Living                      Prior Function            PT Goals (current goals can now be found in the care plan section) Acute Rehab PT Goals Patient Stated Goal: none stated PT Goal Formulation: With patient Time For Goal Achievement: 06/23/19 Potential to Achieve Goals: Fair Progress towards PT goals: Progressing toward goals    Frequency    Min 5X/week      PT Plan Current plan remains appropriate    Co-evaluation PT/OT/SLP Co-Evaluation/Treatment: Yes Reason for Co-Treatment: Complexity of the patient's impairments (multi-system involvement) PT goals addressed during session: Mobility/safety with mobility OT goals addressed during session: ADL's and self-care      AM-PAC PT "6 Clicks" Mobility   Outcome Measure  Help needed turning from your back to your side while in a flat bed without using bedrails?: Total Help needed moving from lying on your back to sitting on the side of a flat bed without using bedrails?: Total Help needed moving to and from a bed to a chair (including a wheelchair)?: Total Help needed standing up from a chair using your arms (e.g., wheelchair or bedside chair)?: Total Help needed to walk in hospital room?: Total Help needed climbing 3-5 steps with a railing? : Total 6 Click Score: 6    End of Session Equipment Utilized During Treatment: Back  brace Activity Tolerance: Patient limited by pain;Patient limited by fatigue Patient left: in chair;with call bell/phone within reach;with chair alarm set;Other (comment) Nurse Communication: Mobility status PT Visit Diagnosis: Other abnormalities of gait and mobility (R26.89);Muscle weakness (generalized) (M62.81);Pain;History of falling (Z91.81) Pain - part of body: Hip;Leg     Time: 2774-1287 PT Time Calculation (min) (ACUTE ONLY): 31 min  Charges:  $Therapeutic Activity: 8-22 mins                     06/11/2019  Donnella Sham, PT Acute Rehabilitation Services [phone]  (pager) [phone]  (office)   Tessie Fass Mabel Unrein 06/11/2019, 6:06 PM

## 2019-06-12 DIAGNOSIS — S32012K Unstable burst fracture of first lumbar vertebra, subsequent encounter for fracture with nonunion: Secondary | ICD-10-CM

## 2019-06-12 LAB — CBC
HCT: 29.7 % — ABNORMAL LOW (ref 39.0–52.0)
Hemoglobin: 8.8 g/dL — ABNORMAL LOW (ref 13.0–17.0)
MCH: 27.8 pg (ref 26.0–34.0)
MCHC: 29.6 g/dL — ABNORMAL LOW (ref 30.0–36.0)
MCV: 94 fL (ref 80.0–100.0)
Platelets: 190 10*3/uL (ref 150–400)
RBC: 3.16 MIL/uL — ABNORMAL LOW (ref 4.22–5.81)
RDW: 17.2 % — ABNORMAL HIGH (ref 11.5–15.5)
WBC: 9.4 10*3/uL (ref 4.0–10.5)
nRBC: 0 % (ref 0.0–0.2)

## 2019-06-12 LAB — BASIC METABOLIC PANEL
Anion gap: 8 (ref 5–15)
BUN: 30 mg/dL — ABNORMAL HIGH (ref 8–23)
CO2: 34 mmol/L — ABNORMAL HIGH (ref 22–32)
Calcium: 8.2 mg/dL — ABNORMAL LOW (ref 8.9–10.3)
Chloride: 98 mmol/L (ref 98–111)
Creatinine, Ser: 0.59 mg/dL — ABNORMAL LOW (ref 0.61–1.24)
GFR calc Af Amer: >60 mL/min (ref >60)
GFR calc non Af Amer: >60 mL/min (ref >60)
Glucose, Bld: 205 mg/dL — ABNORMAL HIGH (ref 70–99)
Potassium: 4.4 mmol/L (ref 3.5–5.1)
Sodium: 140 mmol/L (ref 135–145)

## 2019-06-12 LAB — GLUCOSE, CAPILLARY
Glucose-Capillary: 210 mg/dL — ABNORMAL HIGH (ref 70–99)
Glucose-Capillary: 147 mg/dL — ABNORMAL HIGH (ref 70–99)
Glucose-Capillary: 258 mg/dL — ABNORMAL HIGH (ref 70–99)
Glucose-Capillary: 149 mg/dL — ABNORMAL HIGH (ref 70–99)
Glucose-Capillary: 337 mg/dL — ABNORMAL HIGH (ref 70–99)

## 2019-06-12 LAB — MAGNESIUM: Magnesium: 2.2 mg/dL (ref 1.7–2.4)

## 2019-06-12 NOTE — Progress Notes (Addendum)
PROGRESS NOTE  Cody Hale GYB:[id number] DOB: 02/24/1951 DOA: 05/06/2019 PCP: [name], MD  HPI/Recap of past 55 hours: 68 year old male with history of combined systolic and diastolic congestive heart failure, bioprosthetic mitral valve, endocarditis 2014, atrial fibrillation, A. fib, COPD, diabetes, who presented from home after a fall at home his MRI in the ER showed lumbar fracture and transferred to [facility] was positive for Covid neurosurgery was consulted his hospital course was complicated by gram-positive strep bacteremia and completed antibiotics he underwent spinal fusion with placement of a drain by neurosurgery.  After his spinal fusion it was difficult to ventilate him fusion  so he required to be intubated.  His  hospital course was further complicated by incidental finding of retroperitoneal hematoma so his heparin drip was stopped, IR placed a PEG tube on June 04, 2019  Subjective: Patient seen and examined at bedside he has a little cough and some rhonchi noted.  He denies any complaints  Assessment/Plan: Active Problems:   OSA (obstructive sleep apnea)   Type II diabetes mellitus with manifestations (HCC)   Chronic systolic CHF (congestive heart failure) (HCC)   Hyperlipidemia with target LDL less than 70   Chronic atrial fibrillation   Chronic respiratory failure with hypoxia (HCC)   Unstable burst fracture of unspecified lumbar vertebra, initial encounter for closed fracture (HCC)   Closed unstable burst fracture of lumbar vertebra (HCC)   Closed unstable burst fracture of first lumbar vertebra (HCC)   Pressure injury of skin   Bacteremia   Hyponatremia   NSVT (nonsustained ventricular tachycardia) ([place])  1 retroperitoneal hematoma.  Currently off anticoagulation.  We will continue to monitor his H&H  2.  T11 L4 fusion after her lumbar fracture status post day 16 Continue pain management and bowel control PT OT recommend SNF Case worker is  assisting with SNF placement  3.  Chronic atrial fibrillation currently no anticoagulation use due to retroperitoneal bleed continue Coreg twice daily for rate control  4.  COVID-19 pneumonia resolved patient was treated with steroids remdesivir and bronchodilators   5 chronic systolic congestive heart failure we will continue Coreg we will continue torsemide to maintain euvolemia his ejection fraction is 40 to 45% on echo that was done May 20, 2019  6.  Diabetes mellitus type 2, continue Lantus 20 units daily a sliding scale dose to 4 units every 4 hours  7.  Dysphagia requiring tube placement on June 04, 2019.  Continue tube feeding  8.  Healthcare acquired pneumonia patient has completed cefepime.  Vancomycin was discontinued  Code Status: Full  Severity of Illness: The appropriate patient status for this patient is INPATIENT. Inpatient status is judged to be reasonable and necessary in order to provide the required intensity of service to ensure the patient's safety. The patient's presenting symptoms, physical exam findings, and initial radiographic and laboratory data in the context of their chronic comorbidities is felt to place them at high risk for further clinical deterioration. Furthermore, it is not anticipated that the patient will be medically stable for discharge from the hospital within 2 midnights of admission. The following factors support the patient status of inpatient.  Waiting for nursing home placement*.   * I certify that at the point of admission it is my clinical judgment that the patient will require inpatient hospital care spanning beyond 2 midnights from the point of admission due to high intensity of service, high risk for further deterioration and high frequency of surveillance required.*  Family Communication: None at bedside  Disposition Plan: For SNF   Consultants:  Neurosurgery  PCCM  Cardiology  Dementia radiology  Procedures:   PEG tube placement by interventional radiology  Antimicrobials:  None  DVT prophylaxis: SCD   Objective: Vitals:   06/11/19 2303 06/12/19 0421 06/12/19 0650 06/12/19 0823  BP: 107/61 (!) 105/59  130/74  Pulse: 86 85  72  Resp: 19 18  20   Temp: 99.3 F (37.4 C) 97.9 F (36.6 C)  97.6 F (36.4 C)  TempSrc: Oral Oral  Oral  SpO2: 98%   100%  Weight:   115.4 kg   Height:        Intake/Output Summary (Last 24 hours) at 06/12/2019 1033 Last data filed at 06/12/2019 0422 Gross per 24 hour  Intake -  Output 3450 ml  Net -3450 ml   Filed Weights   06/09/19 0500 06/10/19 0500 06/12/19 0650  Weight: 119 kg 117.4 kg 115.4 kg   Body mass index is 37.57 kg/m.  Exam:  . General: 68 y.o. year-old male well developed well nourished in no acute distress.  Alert and oriented x3.  Obese positive cough . Cardiovascular: Regular rate and rhythm with no rubs or gallops.  No thyromegaly or JVD noted.   Marland Kitchen Respiratory:auscultation with no wheezes has rales and some rhonchi's rales.  Poor inspiratory effort. . Abdomen: Soft nontender nondistended with normal bowel sounds x4 quadrants. . Musculoskeletal: No lower extremity edema. 2/4 pulses in all 4 extremities. . Skin: No ulcerative lesions noted or rashes, . Psychiatry: Mood is appropriate for condition and setting    Data Reviewed: CBC: Recent Labs  Lab 06/07/19 0525 06/09/19 0500 06/10/19 0456 06/11/19 0622 06/12/19 0510  WBC 10.0 11.9* 13.7* 11.3* 9.4  HGB 8.8* 8.7* 9.1* 9.3* 8.8*  HCT 30.0* 29.5* 30.1* 31.4* 29.7*  MCV 95.5 95.2 93.2 94.9 94.0  PLT 234 209 199 184 190   Basic Metabolic Panel: Recent Labs  Lab 06/07/19 1611 06/08/19 0500 06/09/19 0500 06/10/19 0456 06/11/19 0622 06/12/19 0510  NA 148* 145 144 140 138 140  K 4.3 4.2 4.3 4.6 4.5 4.4  CL 110 105 104 101 99 98  CO2 27 27 30 28 30  34*  GLUCOSE 300* 293* 297* 265* 264* 205*  BUN 28* 28* 32* 31* 35* 30*  CREATININE 0.77 0.68 0.85 0.70 0.78 0.59*   CALCIUM 8.3* 8.2* 8.2* 8.2* 8.2* 8.2*  MG 2.4  --   --  2.0 2.2 2.2   GFR: Estimated Creatinine Clearance: 110.8 mL/min (A) (by C-G formula based on SCr of 0.59 mg/dL (L)). Liver Function Tests: No results for input(s): AST, ALT, ALKPHOS, BILITOT, PROT, ALBUMIN in the last 168 hours. No results for input(s): LIPASE, AMYLASE in the last 168 hours. No results for input(s): AMMONIA in the last 168 hours. Coagulation Profile: No results for input(s): INR, PROTIME in the last 168 hours. Cardiac Enzymes: No results for input(s): CKTOTAL, CKMB, CKMBINDEX, TROPONINI in the last 168 hours. BNP (last 3 results) No results for input(s): PROBNP in the last 8760 hours. HbA1C: No results for input(s): HGBA1C in the last 72 hours. CBG: Recent Labs  Lab 06/11/19 1653 06/11/19 2009 06/11/19 2315 06/12/19 0424 06/12/19 0824  GLUCAP 214* 222* 178* 210* 147*   Lipid Profile: No results for input(s): CHOL, HDL, LDLCALC, TRIG, CHOLHDL, LDLDIRECT in the last 72 hours. Thyroid Function Tests: No results for input(s): TSH, T4TOTAL, FREET4, T3FREE, THYROIDAB in the last 72 hours. Anemia Panel: No results for  input(s): VITAMINB12, FOLATE, FERRITIN, TIBC, IRON, RETICCTPCT in the last 72 hours. Urine analysis:    Component Value Date/Time   COLORURINE YELLOW 05/19/2019 1355   APPEARANCEUR CLEAR 05/19/2019 1355   APPEARANCEUR Clear 07/23/2017   LABSPEC 1.013 05/19/2019 1355   PHURINE 5.0 05/19/2019 1355   GLUCOSEU NEGATIVE 05/19/2019 1355   GLUCOSEU 500 (A) 07/14/2017 1053   HGBUR SMALL (A) 05/19/2019 1355   BILIRUBINUR NEGATIVE 05/19/2019 1355   BILIRUBINUR Negative 07/23/2017   KETONESUR NEGATIVE 05/19/2019 1355   PROTEINUR NEGATIVE 05/19/2019 1355   UROBILINOGEN 0.2 07/14/2017 1053   NITRITE NEGATIVE 05/19/2019 1355   LEUKOCYTESUR NEGATIVE 05/19/2019 1355   Sepsis Labs: @LABRCNTIP (procalcitonin:4,lacticidven:4)  ) Recent Results (from the past 240 hour(s))  Expectorated sputum  assessment w rflx to resp cult     Status: None   Collection Time: 06/03/19  9:44 PM   Specimen: Expectorated Sputum  Result Value Ref Range Status   Specimen Description EXPECTORATED SPUTUM  Final   Special Requests Normal  Final   Sputum evaluation   Final    Sputum specimen not acceptable for testing.  Please recollect.   RESULT CALLED TO, READ BACK BY AND VERIFIED WITH: H HUNT RN 06/04/19 0100 JDW Performed at [facility], [address], [place], [place]    Report Status 06/05/2019 FINAL  Final  Culture, blood (routine x 2)     Status: None   Collection Time: 06/03/19  9:45 PM   Specimen: BLOOD LEFT ARM  Result Value Ref Range Status   Specimen Description BLOOD LEFT ARM  Final   Special Requests   Final    BOTTLES DRAWN AEROBIC AND ANAEROBIC Blood Culture adequate volume   Culture   Final    NO GROWTH 5 DAYS Performed at [facility], [address] [place], [place]    Report Status 06/08/2019 FINAL  Final  Culture, blood (routine x 2)     Status: None   Collection Time: 06/03/19  9:50 PM   Specimen: BLOOD LEFT HAND  Result Value Ref Range Status   Specimen Description BLOOD LEFT HAND  Final   Special Requests AEROBIC BOTTLE ONLY Blood Culture adequate volume  Final   Culture   Final    NO GROWTH 5 DAYS Performed at [facility], [address] [place], [place]    Report Status 06/08/2019 FINAL  Final  Expectorated sputum assessment w rflx to resp cult     Status: None   Collection Time: 06/04/19  3:21 AM   Specimen: SPU  Result Value Ref Range Status   Specimen Description SPUTUM  Final   Special Requests NONE  Final   Sputum evaluation   Final    THIS SPECIMEN IS ACCEPTABLE FOR SPUTUM CULTURE Performed at [facility], [address] [place], [place]    Report Status 06/05/2019 FINAL  Final  Culture, respiratory     Status: None   Collection Time: 06/04/19  3:21 AM   Specimen: SPU  Result  Value Ref Range Status   Specimen Description SPUTUM  Final   Special Requests NONE Reflexed from [id number]  Final   Gram Stain   Final    ABUNDANT WBC PRESENT, PREDOMINANTLY PMN MODERATE GRAM POSITIVE RODS    Culture   Final    ABUNDANT CORYNEBACTERIUM STRIATUM Standardized susceptibility testing for this organism is not available. Performed at [facility], [address] [place], [place]  [phone]    Report Status 06/08/2019 FINAL  Final      Studies: No results found.  Scheduled Meds: . sodium chloride   Intravenous Once  . ascorbic acid  500 mg Per Tube Daily  . carvedilol  3.125 mg Per Tube BID WC  . chlorhexidine  15 mL Mouth Rinse BID  . Chlorhexidine Gluconate Cloth  6 each Topical Daily  . feeding supplement (OSMOLITE 1.5 CAL)  474 mL Per Tube TID  . feeding supplement (PRO-STAT SUGAR FREE 64)  30 mL Per Tube BID  . fluticasone  2 spray Each Nare Daily  . fluticasone furoate-vilanterol  1 puff Inhalation Daily  . free water  200 mL Per Tube Q4H  . insulin aspart  0-20 Units Subcutaneous Q4H  . insulin aspart  4 Units Subcutaneous Q4H  . insulin glargine  20 Units Subcutaneous Daily  . liver oil-zinc oxide   Topical QID  . loratadine  10 mg Per Tube QPM  . mouth rinse  15 mL Mouth Rinse q12n4p  . multivitamin with minerals  1 tablet Per Tube Daily  . pantoprazole sodium  40 mg Per Tube Daily  . simvastatin  20 mg Per Tube q morning - 10a  . sodium chloride flush  10-40 mL Intracatheter Q12H  . sodium chloride flush  3 mL Intravenous Q12H  . sulfamethoxazole-trimethoprim  1 tablet Per Tube Q12H  . tamsulosin  0.4 mg Oral QPC breakfast  . torsemide  20 mg Per Tube q1800  . umeclidinium bromide  1 puff Inhalation Daily  . Vilazodone HCl  40 mg Oral Daily    Continuous Infusions: . sodium chloride 250 mL (06/03/19 2141)     LOS: 37 days     Myrtie NeitherNwannadiya Kalven Ganim, MD [facility]  To reach me or the doctor on call, go to: [email or web address] Password [email or web address]   06/12/2019, 10:33 AM

## 2019-06-13 ENCOUNTER — Inpatient Hospital Stay (HOSPITAL_COMMUNITY): Payer: Medicare Other

## 2019-06-13 LAB — GLUCOSE, CAPILLARY
Glucose-Capillary: 140 mg/dL — ABNORMAL HIGH (ref 70–99)
Glucose-Capillary: 212 mg/dL — ABNORMAL HIGH (ref 70–99)
Glucose-Capillary: 226 mg/dL — ABNORMAL HIGH (ref 70–99)
Glucose-Capillary: 263 mg/dL — ABNORMAL HIGH (ref 70–99)
Glucose-Capillary: 264 mg/dL — ABNORMAL HIGH (ref 70–99)
Glucose-Capillary: 335 mg/dL — ABNORMAL HIGH (ref 70–99)
Glucose-Capillary: 364 mg/dL — ABNORMAL HIGH (ref 70–99)
Glucose-Capillary: 428 mg/dL — ABNORMAL HIGH (ref 70–99)

## 2019-06-13 LAB — BASIC METABOLIC PANEL
Anion gap: 6 (ref 5–15)
BUN: 29 mg/dL — ABNORMAL HIGH (ref 8–23)
CO2: 35 mmol/L — ABNORMAL HIGH (ref 22–32)
Calcium: 8 mg/dL — ABNORMAL LOW (ref 8.9–10.3)
Chloride: 96 mmol/L — ABNORMAL LOW (ref 98–111)
Creatinine, Ser: 0.69 mg/dL (ref 0.61–1.24)
GFR calc Af Amer: >60 mL/min (ref >60)
GFR calc non Af Amer: >60 mL/min (ref >60)
Glucose, Bld: 173 mg/dL — ABNORMAL HIGH (ref 70–99)
Potassium: 4.5 mmol/L (ref 3.5–5.1)
Sodium: 137 mmol/L (ref 135–145)

## 2019-06-13 LAB — CBC
HCT: 30 % — ABNORMAL LOW (ref 39.0–52.0)
Hemoglobin: 9 g/dL — ABNORMAL LOW (ref 13.0–17.0)
MCH: 28 pg (ref 26.0–34.0)
MCHC: 30 g/dL (ref 30.0–36.0)
MCV: 93.2 fL (ref 80.0–100.0)
Platelets: 230 10*3/uL (ref 150–400)
RBC: 3.22 MIL/uL — ABNORMAL LOW (ref 4.22–5.81)
RDW: 17.1 % — ABNORMAL HIGH (ref 11.5–15.5)
WBC: 10.9 10*3/uL — ABNORMAL HIGH (ref 4.0–10.5)
nRBC: 0 % (ref 0.0–0.2)

## 2019-06-13 LAB — MAGNESIUM: Magnesium: 2.3 mg/dL (ref 1.7–2.4)

## 2019-06-13 NOTE — Progress Notes (Signed)
0630: Foul smelling, purulent drainage was coming from around PEG tube when changing dressing. Site is red and firm to touch. Will pass on findings to next shift and continue to monitor.

## 2019-06-13 NOTE — Progress Notes (Signed)
Patient refused CPT at this time. Stated he wanted to sleep. Encouraged him to cough first, BBS rhonchi. Will check back in a bit

## 2019-06-13 NOTE — Progress Notes (Signed)
PROGRESS NOTE  Cody Hale HTX:[id number] DOB: 1950/07/09 DOA: 05/06/2019 PCP: [name], MD  HPI/Recap of past 42 hours: 68 year old male with history of combined systolic and diastolic congestive heart failure, bioprosthetic mitral valve, endocarditis 2014, atrial fibrillation, A. fib, COPD, diabetes, who presented from home after a fall at home his MRI in the ER showed lumbar fracture and transferred to [facility] was positive for Covid neurosurgery was consulted his hospital course was complicated by gram-positive strep bacteremia and completed antibiotics he underwent spinal fusion with placement of a drain by neurosurgery.  After his spinal fusion it was difficult to ventilate him fusion  so he required to be intubated.  His  hospital course was further complicated by incidental finding of retroperitoneal hematoma so his heparin drip was stopped, IR placed a PEG tube on June 04, 2019  Subjective: Patient seen and examined at bedside he has a little cough and some rhonchi noted.  He denies any complaints  June 13, 2019: Subjective: Patient seen and examined he is lying in bed he was groaning little bit when I walked in is complaining of stomach pain he is coughing but making very little effort to expectorate  Assessment/Plan: Active Problems:   OSA (obstructive sleep apnea)   Type II diabetes mellitus with manifestations (HCC)   Chronic systolic CHF (congestive heart failure) (HCC)   Hyperlipidemia with target LDL less than 70   Chronic atrial fibrillation   Chronic respiratory failure with hypoxia (HCC)   Unstable burst fracture of unspecified lumbar vertebra, initial encounter for closed fracture (HCC)   Closed unstable burst fracture of lumbar vertebra (HCC)   Closed unstable burst fracture of first lumbar vertebra (HCC)   Pressure injury of skin   Bacteremia   Hyponatremia   NSVT (nonsustained ventricular tachycardia) (HCC)  1 retroperitoneal hematoma.   Currently off anticoagulation.  We will continue to monitor his H&H  2.  T11 L4 fusion after her lumbar fracture status post day 16 Continue pain management and bowel control PT OT recommend SNF Case worker is assisting with SNF placement  3.  Chronic atrial fibrillation currently no anticoagulation use due to retroperitoneal bleed continue Coreg twice daily for rate control  4.  COVID-19 pneumonia resolved patient was treated with steroids remdesivir and bronchodilators.  He might still be having post Covid malaise/syndrome is looking still very poorly   5 chronic systolic congestive heart failure we will continue Coreg we will continue torsemide to maintain euvolemia his ejection fraction is 40 to 45% on echo that was done May 20, 2019  6.  Diabetes mellitus type 2, continue Lantus 20 units daily a sliding scale dose to 4 units every 4 hours  7.  Dysphagia requiring tube placement on June 04, 2019.  Continue tube feeding  8.  Healthcare acquired pneumonia patient has completed cefepime.  Vancomycin was discontinued  Code Status: Full  Severity of Illness: The appropriate patient status for this patient is INPATIENT. Inpatient status is judged to be reasonable and necessary in order to provide the required intensity of service to ensure the patient's safety. The patient's presenting symptoms, physical exam findings, and initial radiographic and laboratory data in the context of their chronic comorbidities is felt to place them at high risk for further clinical deterioration. Furthermore, it is not anticipated that the patient will be medically stable for discharge from the hospital within 2 midnights of admission. The following factors support the patient status of inpatient.  Waiting for nursing home  placement*.   * I certify that at the point of admission it is my clinical judgment that the patient will require inpatient hospital care spanning beyond 2 midnights from the point of  admission due to high intensity of service, high risk for further deterioration and high frequency of surveillance required.*    Family Communication: None at bedside  Disposition Plan: For SNF   Consultants:  Neurosurgery  PCCM  Cardiology  Dementia radiology  Procedures:  PEG tube placement by interventional radiology  Antimicrobials:  None  DVT prophylaxis: SCD   Objective: Vitals:   06/13/19 0400 06/13/19 0745 06/13/19 0820 06/13/19 0821  BP: 132/65 137/61    Pulse: 96 78    Resp: 20     Temp: 97.9 F (36.6 C) 98.4 F (36.9 C)    TempSrc: Oral     SpO2: 98% 96% 97% 100%  Weight:      Height:        Intake/Output Summary (Last 24 hours) at 06/13/2019 0909 Last data filed at 06/13/2019 0203 Gross per 24 hour  Intake -  Output 1875 ml  Net -1875 ml   Filed Weights   06/09/19 0500 06/10/19 0500 06/12/19 0650  Weight: 119 kg 117.4 kg 115.4 kg   Body mass index is 37.57 kg/m.  Exam:  . General: 68 y.o. year-old male well developed well nourished in no acute distress.  Alert and oriented x3.  Obese positive cough.  Still looks poorly . Cardiovascular: Regular rate and rhythm with no rubs or gallops.  No thyromegaly or JVD noted.   Marland Kitchen. Respiratory:auscultation with no wheezes has rales, coarse breath sounds and some rhonchi's rales.  Poor inspiratory effort. . Abdomen: Soft nontender nondistended with normal bowel sounds x4 quadrants. . Musculoskeletal: No lower extremity edema. 2/4 pulses in all 4 extremities. . Skin: No ulcerative lesions noted or rashes, . Psychiatry: Mood is appropriate for condition and setting    Data Reviewed: CBC: Recent Labs  Lab 06/09/19 0500 06/10/19 0456 06/11/19 0622 06/12/19 0510 06/13/19 0600  WBC 11.9* 13.7* 11.3* 9.4 10.9*  HGB 8.7* 9.1* 9.3* 8.8* 9.0*  HCT 29.5* 30.1* 31.4* 29.7* 30.0*  MCV 95.2 93.2 94.9 94.0 93.2  PLT 209 199 184 190 230   Basic Metabolic Panel: Recent Labs  Lab 06/07/19 1611   06/09/19 0500 06/10/19 0456 06/11/19 0622 06/12/19 0510 06/13/19 0600  NA 148*   < > 144 140 138 140 137  K 4.3   < > 4.3 4.6 4.5 4.4 4.5  CL 110   < > 104 101 99 98 96*  CO2 27   < > 30 28 30  34* 35*  GLUCOSE 300*   < > 297* 265* 264* 205* 173*  BUN 28*   < > 32* 31* 35* 30* 29*  CREATININE 0.77   < > 0.85 0.70 0.78 0.59* 0.69  CALCIUM 8.3*   < > 8.2* 8.2* 8.2* 8.2* 8.0*  MG 2.4  --   --  2.0 2.2 2.2 2.3   < > = values in this interval not displayed.   GFR: Estimated Creatinine Clearance: 110.8 mL/min (by C-G formula based on SCr of 0.69 mg/dL). Liver Function Tests: No results for input(s): AST, ALT, ALKPHOS, BILITOT, PROT, ALBUMIN in the last 168 hours. No results for input(s): LIPASE, AMYLASE in the last 168 hours. No results for input(s): AMMONIA in the last 168 hours. Coagulation Profile: No results for input(s): INR, PROTIME in the last 168 hours. Cardiac Enzymes: No results  for input(s): CKTOTAL, CKMB, CKMBINDEX, TROPONINI in the last 168 hours. BNP (last 3 results) No results for input(s): PROBNP in the last 8760 hours. HbA1C: No results for input(s): HGBA1C in the last 72 hours. CBG: Recent Labs  Lab 06/12/19 1542 06/12/19 2057 06/13/19 0035 06/13/19 0441 06/13/19 0745  GLUCAP 149* 337* 335* 212* 140*   Lipid Profile: No results for input(s): CHOL, HDL, LDLCALC, TRIG, CHOLHDL, LDLDIRECT in the last 72 hours. Thyroid Function Tests: No results for input(s): TSH, T4TOTAL, FREET4, T3FREE, THYROIDAB in the last 72 hours. Anemia Panel: No results for input(s): VITAMINB12, FOLATE, FERRITIN, TIBC, IRON, RETICCTPCT in the last 72 hours. Urine analysis:    Component Value Date/Time   COLORURINE YELLOW 05/19/2019 1355   APPEARANCEUR CLEAR 05/19/2019 1355   APPEARANCEUR Clear 07/23/2017   LABSPEC 1.013 05/19/2019 1355   PHURINE 5.0 05/19/2019 1355   GLUCOSEU NEGATIVE 05/19/2019 1355   GLUCOSEU 500 (A) 07/14/2017 1053   HGBUR SMALL (A) 05/19/2019 1355    BILIRUBINUR NEGATIVE 05/19/2019 1355   BILIRUBINUR Negative 07/23/2017   KETONESUR NEGATIVE 05/19/2019 1355   PROTEINUR NEGATIVE 05/19/2019 1355   UROBILINOGEN 0.2 07/14/2017 1053   NITRITE NEGATIVE 05/19/2019 1355   LEUKOCYTESUR NEGATIVE 05/19/2019 1355   Sepsis Labs: @LABRCNTIP (procalcitonin:4,lacticidven:4)  ) Recent Results (from the past 240 hour(s))  Expectorated sputum assessment w rflx to resp cult     Status: None   Collection Time: 06/03/19  9:44 PM   Specimen: Expectorated Sputum  Result Value Ref Range Status   Specimen Description EXPECTORATED SPUTUM  Final   Special Requests Normal  Final   Sputum evaluation   Final    Sputum specimen not acceptable for testing.  Please recollect.   RESULT CALLED TO, READ BACK BY AND VERIFIED WITH: H HUNT RN 06/04/19 0100 JDW Performed at [facility], [address], [place], [place]    Report Status 06/05/2019 FINAL  Final  Culture, blood (routine x 2)     Status: None   Collection Time: 06/03/19  9:45 PM   Specimen: BLOOD LEFT ARM  Result Value Ref Range Status   Specimen Description BLOOD LEFT ARM  Final   Special Requests   Final    BOTTLES DRAWN AEROBIC AND ANAEROBIC Blood Culture adequate volume   Culture   Final    NO GROWTH 5 DAYS Performed at [facility], [address], [place], [place]    Report Status 06/08/2019 FINAL  Final  Culture, blood (routine x 2)     Status: None   Collection Time: 06/03/19  9:50 PM   Specimen: BLOOD LEFT HAND  Result Value Ref Range Status   Specimen Description BLOOD LEFT HAND  Final   Special Requests AEROBIC BOTTLE ONLY Blood Culture adequate volume  Final   Culture   Final    NO GROWTH 5 DAYS Performed at [facility], [address] [place], [place]    Report Status 06/08/2019 FINAL  Final  Expectorated sputum assessment w rflx to resp cult     Status: None   Collection Time: 06/04/19  3:21 AM   Specimen: SPU  Result Value Ref  Range Status   Specimen Description SPUTUM  Final   Special Requests NONE  Final   Sputum evaluation   Final    THIS SPECIMEN IS ACCEPTABLE FOR SPUTUM CULTURE Performed at [facility], [address] [place], [place]    Report Status 06/05/2019 FINAL  Final  Culture, respiratory     Status: None   Collection Time: 06/04/19  3:21 AM   Specimen: SPU  Result Value Ref Range Status   Specimen Description SPUTUM  Final   Special Requests NONE Reflexed from [id number]  Final   Gram Stain   Final    ABUNDANT WBC PRESENT, PREDOMINANTLY PMN MODERATE GRAM POSITIVE RODS    Culture   Final    ABUNDANT CORYNEBACTERIUM STRIATUM Standardized susceptibility testing for this organism is not available. Performed at [facility], [place] [address] [place], [place] [address]    Report Status 06/08/2019 FINAL  Final      Studies: No results found.  Scheduled Meds: . sodium chloride   Intravenous Once  . ascorbic acid  500 mg Per Tube Daily  . carvedilol  3.125 mg Per Tube BID WC  . chlorhexidine  15 mL Mouth Rinse BID  . Chlorhexidine Gluconate Cloth  6 each Topical Daily  . feeding supplement (OSMOLITE 1.5 CAL)  474 mL Per Tube TID  . feeding supplement (PRO-STAT SUGAR FREE 64)  30 mL Per Tube BID  . fluticasone  2 spray Each Nare Daily  . fluticasone furoate-vilanterol  1 puff Inhalation Daily  . free water  200 mL Per Tube Q4H  . insulin aspart  0-20 Units Subcutaneous Q4H  . insulin aspart  4 Units Subcutaneous Q4H  . insulin glargine  20 Units Subcutaneous Daily  . liver oil-zinc oxide   Topical QID  . loratadine  10 mg Per Tube QPM  . mouth rinse  15 mL Mouth Rinse q12n4p  . multivitamin with minerals  1 tablet Per Tube Daily  . pantoprazole sodium  40 mg Per Tube Daily  . simvastatin  20 mg Per Tube q morning - 10a  . sodium chloride flush  10-40 mL Intracatheter Q12H  . sodium chloride flush  3 mL Intravenous Q12H  . sulfamethoxazole-trimethoprim  1 tablet Per  Tube Q12H  . tamsulosin  0.4 mg Oral QPC breakfast  . torsemide  20 mg Per Tube q1800  . umeclidinium bromide  1 puff Inhalation Daily  . Vilazodone HCl  40 mg Oral Daily    Continuous Infusions: . sodium chloride 250 mL (06/03/19 2141)     LOS: 38 days     Cristal Deer, MD [facility]  To reach me or the doctor on call, go to: [email or web address] Password [email or web address]  06/13/2019, 9:09 AM

## 2019-06-14 ENCOUNTER — Inpatient Hospital Stay (HOSPITAL_COMMUNITY): Payer: Medicare Other

## 2019-06-14 LAB — CBC
HCT: 30.3 % — ABNORMAL LOW (ref 39.0–52.0)
Hemoglobin: 8.9 g/dL — ABNORMAL LOW (ref 13.0–17.0)
MCH: 27.6 pg (ref 26.0–34.0)
MCHC: 29.4 g/dL — ABNORMAL LOW (ref 30.0–36.0)
MCV: 94.1 fL (ref 80.0–100.0)
Platelets: 218 10*3/uL (ref 150–400)
RBC: 3.22 MIL/uL — ABNORMAL LOW (ref 4.22–5.81)
RDW: 17.2 % — ABNORMAL HIGH (ref 11.5–15.5)
WBC: 11.8 10*3/uL — ABNORMAL HIGH (ref 4.0–10.5)
nRBC: 0 % (ref 0.0–0.2)

## 2019-06-14 LAB — GLUCOSE, CAPILLARY
Glucose-Capillary: 227 mg/dL — ABNORMAL HIGH (ref 70–99)
Glucose-Capillary: 165 mg/dL — ABNORMAL HIGH (ref 70–99)
Glucose-Capillary: 324 mg/dL — ABNORMAL HIGH (ref 70–99)
Glucose-Capillary: 208 mg/dL — ABNORMAL HIGH (ref 70–99)
Glucose-Capillary: 135 mg/dL — ABNORMAL HIGH (ref 70–99)

## 2019-06-14 LAB — BASIC METABOLIC PANEL
Anion gap: 7 (ref 5–15)
BUN: 30 mg/dL — ABNORMAL HIGH (ref 8–23)
CO2: 34 mmol/L — ABNORMAL HIGH (ref 22–32)
Calcium: 8.1 mg/dL — ABNORMAL LOW (ref 8.9–10.3)
Chloride: 96 mmol/L — ABNORMAL LOW (ref 98–111)
Creatinine, Ser: 0.74 mg/dL (ref 0.61–1.24)
GFR calc Af Amer: >60 mL/min (ref >60)
GFR calc non Af Amer: >60 mL/min (ref >60)
Glucose, Bld: 220 mg/dL — ABNORMAL HIGH (ref 70–99)
Potassium: 5.2 mmol/L — ABNORMAL HIGH (ref 3.5–5.1)
Sodium: 137 mmol/L (ref 135–145)

## 2019-06-14 LAB — MAGNESIUM: Magnesium: 2.1 mg/dL (ref 1.7–2.4)

## 2019-06-14 MED ORDER — IOHEXOL 300 MG/ML  SOLN
50.0000 mL | Freq: Once | INTRAMUSCULAR | Status: AC | PRN
  Administered 2019-06-14: 50 mL

## 2019-06-14 NOTE — Progress Notes (Signed)
Chest pt not done this round. Patient is resting comfortably and RN asked not to wake him. Will continue next rounds.

## 2019-06-14 NOTE — Progress Notes (Signed)
Inpatient Diabetes Program Recommendations  AACE/ADA: New Consensus Statement on Inpatient Glycemic Control (2015)  Target Ranges:  Prepandial:   less than 140 mg/dL      Peak postprandial:   less than 180 mg/dL (1-2 hours)      Critically ill patients:  140 - 180 mg/dL   Lab Results  Component Value Date   GLUCAP 324 (H) 06/14/2019   HGBA1C 7.4 (H) 03/24/2019    Review of Glycemic Control Results for Cody Hale, Cody "DAVID" (MRN [id number]) as of 06/14/2019 12:58  Ref. Range 06/13/2019 23:28 06/13/2019 23:30 06/14/2019 03:54 06/14/2019 08:40 06/14/2019 12:46  Glucose-Capillary Latest Ref Range: 70 - 99 mg/dL 428 (H) 364 (H) 227 (H) 165 (H) 324 (H)   Diabetes history: DM 2 Outpatient Diabetes medications:  Trulicity 1.5 mg weekly, Novolog 5 units tid with meals, Tresiba 50 units daily Current orders for Inpatient glycemic control:  Novolog resistant q 4 hours Osmolite 474 ml at 1000, 1600, 2200 Novolog 4 units q 4 hours Lantus 20 units daily  Inpatient Diabetes Program Recommendations:    Consider increasing Lantus to 30 units daily and change Novolog tube feed coverage to 8 units with bolus feeds at 1000, 1600, and 2200.  Change Novolog correction to pre tube feed bolus at 1000, 1600, and 2200.   Thanks,  Adah Perl, RN, BC-ADM Inpatient Diabetes Coordinator Pager [phone]

## 2019-06-14 NOTE — Progress Notes (Signed)
Physical Therapy Treatment Patient Details Name: Cody Hale MRN: [id number] DOB: [birth year] Today's Date: 06/14/2019    History of Present Illness Pt is a 68 yo male presented 05/06/19 to ED after fall  (he tripped over some blankets), onto his buttocks with severe back pain;  CT lumbar spine showing unstable fracture L1-2 and likely ankylosing spondylitis; tested +COVID and developed fever and required TEE to r/o endocarditis prior to surgery;  11/17 underwent open reduction internal fixation of L1-L2 Chance fracture with multilevel spine stabilization from T11-L4. Remained intubated until 07/25/18 PMHx: Afib, MVR, SVT, anxiety, CAD, COPD, CHF, HTN,     PT Comments    Pt seen with OT to advance OOB mobility. Pt remains to have impaired cognition and inability to comprehend and process importance for OOB as well back precautions. Pt very anxious about pain and falling requiring totalAx2 for bed mobiltiy and lateral scoot to the chair. Pt with strong retropulsion at EOB requiring maxA to maintain EOB balance. Worked on pt pulling self forward to neutral however unable to maintain. Completed slide board transfer to drop arm recliner with totalAx2, pt with poor processing to participate in transfer. Acute PT to cont to fllow.    Follow Up Recommendations  SNF     Equipment Recommendations  Other (comment)    Recommendations for Other Services       Precautions / Restrictions Precautions Precautions: Back;Fall Precaution Booklet Issued: No Precaution Comments: no awareness to back preacutions Required Braces or Orthoses: Spinal Brace Spinal Brace: Thoracolumbosacral orthotic;Applied in sitting position Spinal Brace Comments: pt dependent to doff at EOB Restrictions Weight Bearing Restrictions: No    Mobility  Bed Mobility Overal bed mobility: Needs Assistance Bed Mobility: Rolling;Supine to Sit Rolling: Max assist;+2 for physical assistance Sidelying to sit: Total assist;+2 for  physical assistance       General bed mobility comments: max verbal and tactile cues to assist with rolling, total assist for trunk elevation and to achieve EOB upright position with hips square ont he edge, pt with strong retropulsion  Transfers Overall transfer level: Needs assistance Equipment used: 2 person hand held assist;Sliding board Transfers: Lateral/Scoot Transfers          Lateral/Scoot Transfers: Total assist;+2 physical assistance;With slide board General transfer comment: despite maximal verbal cues pt with heavy posterior lean making transfers difficult.  Pt not able to assist until finally could reach R arm rest to help pull.    Ambulation/Gait             General Gait Details: unable   Stairs             Wheelchair Mobility    Modified Rankin (Stroke Patients Only)       Balance Overall balance assessment: Needs assistance Sitting-balance support: Bilateral upper extremity supported;Feet supported Sitting balance-Leahy Scale: Zero Sitting balance - Comments: pt with strong retropulsion, with max verbal and tactile cues pt able to pull self forward to midline however unable to maintain >5 sec Postural control: Posterior lean Standing balance support: Bilateral upper extremity supported Standing balance-Leahy Scale: Poor Standing balance comment: Pt unable to stand on this date.                            Cognition Arousal/Alertness: Awake/alert Behavior During Therapy: Anxious Overall Cognitive Status: Impaired/Different from baseline Area of Impairment: Orientation;Attention;Memory;Following commands;Safety/judgement;Awareness;Problem solving  Orientation Level: Disoriented to;Time;Situation Current Attention Level: Sustained Memory: Decreased recall of precautions;Decreased short-term memory Following Commands: Follows one step commands consistently Safety/Judgement: Decreased awareness of  safety;Decreased awareness of deficits Awareness: Intellectual Problem Solving: Slow processing;Decreased initiation;Requires verbal cues;Requires tactile cues General Comments: pt able to state he's in the hospital and he injured his back however unable to retain what PT/OT was there to do and why it's important to be up and OOB, pt also unable to process time      Exercises      General Comments General comments (skin integrity, edema, etc.): VSS however pt confused      Pertinent Vitals/Pain Pain Assessment: Faces Faces Pain Scale: Hurts whole lot Pain Location: over all generalized Pain Descriptors / Indicators: Operative site guarding;Grimacing;Guarding(during mobiltiy) Pain Intervention(s): Limited activity within patient's tolerance    Home Living                      Prior Function            PT Goals (current goals can now be found in the care plan section) Acute Rehab PT Goals Patient Stated Goal: none stated PT Goal Formulation: With patient Time For Goal Achievement: 06/23/19 Potential to Achieve Goals: Fair    Frequency    Min 5X/week      PT Plan Current plan remains appropriate    Co-evaluation PT/OT/SLP Co-Evaluation/Treatment: Yes Reason for Co-Treatment: For patient/therapist safety;Complexity of the patient's impairments (multi-system involvement) PT goals addressed during session: Mobility/safety with mobility        AM-PAC PT "6 Clicks" Mobility   Outcome Measure  Help needed turning from your back to your side while in a flat bed without using bedrails?: Total Help needed moving from lying on your back to sitting on the side of a flat bed without using bedrails?: Total Help needed moving to and from a bed to a chair (including a wheelchair)?: Total Help needed standing up from a chair using your arms (e.g., wheelchair or bedside chair)?: Total Help needed to walk in hospital room?: Total Help needed climbing 3-5 steps with a  railing? : Total 6 Click Score: 6    End of Session Equipment Utilized During Treatment: Back brace Activity Tolerance: Patient limited by pain;Patient limited by fatigue Patient left: in chair;with call bell/phone within reach;with chair alarm set;Other (comment) Nurse Communication: Mobility status PT Visit Diagnosis: Other abnormalities of gait and mobility (R26.89);Muscle weakness (generalized) (M62.81);Pain;History of falling (Z91.81) Pain - part of body: Hip;Leg     Time: 6045-4098 PT Time Calculation (min) (ACUTE ONLY): 27 min  Charges:  $Therapeutic Activity: 8-22 mins                     Lewis Shock, PT, DPT Acute Rehabilitation Services Pager #: [phone] Office #: [phone]    Iona Hansen 06/14/2019, 1:49 PM

## 2019-06-14 NOTE — Progress Notes (Signed)
Occupational Therapy Treatment Patient Details Name: Cody Hale MRN: [id number] DOB: September 30, 1950 Today's Date: 06/14/2019    History of present illness Pt is a 68 yo male presented 05/06/19 to ED after fall  (he tripped over some blankets), onto his buttocks with severe back pain;  CT lumbar spine showing unstable fracture L1-2 and likely ankylosing spondylitis; tested +COVID and developed fever and required TEE to r/o endocarditis prior to surgery;  11/17 underwent open reduction internal fixation of L1-L2 Chance fracture with multilevel spine stabilization from T11-L4. Remained intubated until 07/25/18 PMHx: Afib, MVR, SVT, anxiety, CAD, COPD, CHF, HTN,    OT comments  Pt seen for bed mobility and slide board tranfers in prep for toileting tasks.  Pt very resistive to any activity and requires encouragement to do any adls or movement.  Pt continues to require total assist x2 for all mobility and adls.  Limited by pain.  Will continue to follow.  Follow Up Recommendations  SNF;Supervision/Assistance - 24 hour    Equipment Recommendations  3 in 1 bedside commode;Wheelchair (measurements OT);Wheelchair cushion (measurements OT);Hospital bed    Recommendations for Other Services      Precautions / Restrictions Precautions Precautions: Back;Fall Precaution Booklet Issued: No Precaution Comments: no awareness to back preacutions Required Braces or Orthoses: Spinal Brace Spinal Brace: Thoracolumbosacral orthotic;Applied in sitting position Restrictions Weight Bearing Restrictions: No       Mobility Bed Mobility Overal bed mobility: Needs Assistance Bed Mobility: Rolling;Supine to Sit Rolling: Max assist;+2 for physical assistance Sidelying to sit: Total assist;+2 for physical assistance       General bed mobility comments: Pt assisted with rolling.  Once in sidelying, pt did assist to get to sitting.  Pt unable to maintain sitting without max to total assist x2.  Pt with heavy  posterior lean.   Transfers Overall transfer level: Needs assistance Equipment used: 2 person hand held assist;Sliding board Transfers: Lateral/Scoot Transfers          Lateral/Scoot Transfers: Total assist;+2 physical assistance;With slide board General transfer comment: Pt with heavy posterior lean making transfers difficult.  Pt not able to assist until finally could reach R arm rest to help pull.      Balance Overall balance assessment: Needs assistance Sitting-balance support: Bilateral upper extremity supported;Feet supported Sitting balance-Leahy Scale: Zero Sitting balance - Comments: posterior lean and pushing posteriorly Postural control: Posterior lean     Standing balance comment: Pt unable to stand on this date.                           ADL either performed or assessed with clinical judgement   ADL Overall ADL's : Needs assistance/impaired     Grooming: Moderate assistance Grooming Details (indicate cue type and reason): Pt able to suction self.  Pt very quick to let others do for him and has the ability to do more for himself.                   Toilet Transfer: Total assistance;+2 for physical assistance;Requires drop arm;Transfer board;Cueing for sequencing;Cueing for safety Toilet Transfer Details (indicate cue type and reason): Bed to chair with slide board.   Toileting- Clothing Manipulation and Hygiene: Total assistance;+2 for physical assistance Toileting - Clothing Manipulation Details (indicate cue type and reason): +2 for sit<>stand and then Total A for peri care after BM     Functional mobility during ADLs: Total assistance;+2 for physical assistance General ADL Comments: Pt  continues with drainage from incision.  Pt requires max encouragment to participate in all activities and is very resistant.      Vision   Vision Assessment?: No apparent visual deficits   Perception     Praxis      Cognition Arousal/Alertness:  Awake/alert Behavior During Therapy: Anxious Overall Cognitive Status: Impaired/Different from baseline Area of Impairment: Orientation;Attention;Memory;Following commands;Safety/judgement;Awareness;Problem solving                 Orientation Level: Disoriented to;Time;Situation Current Attention Level: Sustained Memory: Decreased recall of precautions;Decreased short-term memory Following Commands: Follows one step commands consistently Safety/Judgement: Decreased awareness of safety;Decreased awareness of deficits Awareness: Intellectual Problem Solving: Slow processing;Decreased initiation;Requires verbal cues;Requires tactile cues General Comments: Pt confused.  Yelling to get back in chair despite having call button.  Mildly confused as to where he is and wants to get back in bed.         Exercises     Shoulder Instructions       General Comments Pt making very slow progress    Pertinent Vitals/ Pain       Pain Assessment: 0-10 Pain Score: 8  Faces Pain Scale: Hurts whole lot Pain Location: over all generalized Pain Descriptors / Indicators: Operative site guarding Pain Intervention(s): Limited activity within patient's tolerance;Monitored during session;Repositioned  Home Living                                          Prior Functioning/Environment              Frequency  Min 2X/week        Progress Toward Goals  OT Goals(current goals can now be found in the care plan section)  Progress towards OT goals: Progressing toward goals  Acute Rehab OT Goals Patient Stated Goal: none stated OT Goal Formulation: With patient Time For Goal Achievement: 06/25/19 Potential to Achieve Goals: Good ADL Goals Pt Will Perform Grooming: with min assist;sitting Pt Will Perform Upper Body Dressing: with min assist;sitting Pt Will Transfer to Toilet: stand pivot transfer;bedside commode;with mod assist Pt/caregiver will Perform Home Exercise  Program: Both right and left upper extremity;Increased strength;With minimal assist;With written HEP provided Additional ADL Goal #1: Pt will follow 100% commands in 4/6 trials with 90% accuracy.  Plan Discharge plan remains appropriate    Co-evaluation    PT/OT/SLP Co-Evaluation/Treatment: Yes Reason for Co-Treatment: For patient/therapist safety;To address functional/ADL transfers;Complexity of the patient's impairments (multi-system involvement) PT goals addressed during session: Mobility/safety with mobility OT goals addressed during session: ADL's and self-care      AM-PAC OT "6 Clicks" Daily Activity     Outcome Measure   Help from another person eating meals?: Total Help from another person taking care of personal grooming?: Total Help from another person toileting, which includes using toliet, bedpan, or urinal?: Total Help from another person bathing (including washing, rinsing, drying)?: Total Help from another person to put on and taking off regular upper body clothing?: Total Help from another person to put on and taking off regular lower body clothing?: Total 6 Click Score: 6    End of Session Equipment Utilized During Treatment: Back brace  OT Visit Diagnosis: Unsteadiness on feet (R26.81);Muscle weakness (generalized) (M62.81);Pain Pain - part of body: (back)   Activity Tolerance Patient limited by pain;Patient limited by fatigue   Patient Left in chair;with call bell/phone within reach;with chair  alarm set   Nurse Communication Mobility status;Precautions        Time: [phone] OT Time Calculation (min): 27 min  Charges: OT General Charges $OT Visit: 1 Visit OT Treatments $Self Care/Home Management : 8-22 mins     Glenford Peers 06/14/2019, 10:11 AM

## 2019-06-14 NOTE — TOC Progression Note (Signed)
Transition of Care [facility]) - Progression Note    Patient Details  Name: Jonavan Vanhorn MRN: [id number] Date of Birth: 17-Feb-1951  Transition of Care [facility]) CM/SW [place], [place] Phone Number: 06/14/2019, 11:46 AM  Clinical Narrative:     CSW spoke with the patient's son, provided updates on SNF placement. He requested CSW check for availability at [facility] and [place] [place] confirmed availability. Patient's agreed to [facility] rehab at [facility].   SNF inquired if patient will discharge on Trulicity. CSW will follow up.   Thurmond Butts, MSW, [facility] Clinical Social Worker [phone]   Expected Discharge Plan: Skilled Nursing Facility Barriers to Discharge: Continued Medical Work up  Expected Discharge Plan and Services Expected Discharge Plan: [place] arrangements for the past 2 months: Single Family Home                                       Social Determinants of Health (SDOH) Interventions    Readmission Risk Interventions No flowsheet data found.

## 2019-06-14 NOTE — Progress Notes (Addendum)
  NEUROSURGERY PROGRESS NOTE   Came by for wound check. Patient is POD# 19 from T11-L4 fusion for L1-L2 chance type fracture.    Staples in place. Further skin breakdown at previous site. Staples removed in all areas except where there is breakdown. When staples were removed close to wound breakdown, there was yellow, clear discharge from wound. Some redness surrounding incision, but appears from bandage. No streaking, warmth. No frank purulent discharge. No odor.  A/P: Thoracolumbar wound dehiscence, possible superficial infection vs underlying seroma - surgery was all percutaneous. If this is truly infection, would suspect more superficial. No intervention at this point. -  Obtain wound cultures including Gram stain -  Will consult Wound Care -  Okay to remove staples if necessary per WOC -  Will continue to monitor incision -  bandage placed over top -  Continue po bactrim

## 2019-06-14 NOTE — Progress Notes (Signed)
PROGRESS NOTE  Cody Hale ENI:[id number] DOB: 1951-04-30 DOA: 05/06/2019 PCP: [name], MD  HPI/Recap of past 48 hours: 68 year old male with history of combined systolic and diastolic congestive heart failure, bioprosthetic mitral valve, endocarditis 2014, atrial fibrillation, A. fib, COPD, diabetes, who presented from home after a fall at home his MRI in the ER showed lumbar fracture and transferred to [facility] was positive for Covid neurosurgery was consulted his hospital course was complicated by gram-positive strep bacteremia and completed antibiotics he underwent spinal fusion with placement of a drain by neurosurgery.  After his spinal fusion it was difficult to ventilate him fusion  so he required to be intubated.  His  hospital course was further complicated by incidental finding of retroperitoneal hematoma so his heparin drip was stopped, IR placed a PEG tube on June 04, 2019  Subjective: Patient seen and examined he is lying in bed he was groaning little bit when I walked in is complaining of stomach pain he is coughing but making very little effort to expectorate  Assessment/Plan: Active Problems:   OSA (obstructive sleep apnea)   Type II diabetes mellitus with manifestations (HCC)   Chronic systolic CHF (congestive heart failure) (HCC)   Hyperlipidemia with target LDL less than 70   Chronic atrial fibrillation   Chronic respiratory failure with hypoxia (HCC)   Unstable burst fracture of unspecified lumbar vertebra, initial encounter for closed fracture (HCC)   Closed unstable burst fracture of lumbar vertebra (HCC)   Closed unstable burst fracture of first lumbar vertebra (HCC)   Pressure injury of skin   Bacteremia   Hyponatremia   NSVT (nonsustained ventricular tachycardia) (HCC)   T11/L4 fusion after her lumbar fracture 05/25/19 Wound dehiscence noted Neurosurgery continues to follow, appreciate insight and recommendations  Seen by PT OT with  recommendation for SNF. CSW assisting with SNF placement. Continue bactrim DS BID x 10 days. Staple removal concerning for purulent material below -follow wound cultures  Dysphagia requiring PEG tube placement 06/04/2019 -Continue aspiration precaution.  -Questionable PEG tube dysfunction overnight, noted leakage and erythema at PEG tube insert location, unclear if leakage overnight was truly purulent versus tube feedings. -Imaging ordered for IR to evaluate PEG tube location, currently pending PEG tube feeding per nutrition team. We will hold PEG tube feedings for now until further evaluation  Retroperitoneal hematoma affecting gluteus musculature bilaterally Incidental finding on the scan which was done prior to PEG tube placement.  Currently off anticoagulation. No surgical intervention indicated. Hemoglobin stable around 9 No sign of overt bleeding. Continue to hold anticoagulation for now.  Healthcare acquired pneumonia, Corynebacterium Completed treatment with cefepime/vancomycin Clinically improved  Acute hypoxic respiratory failure, likely in the setting of above, POA ongoing Likely silent aspiration. Supportive care. As needed bronchodilators. Maintain O2 saturation greater than 92% Home O2 evaluation for DC planning -ambulatory O2 screen if patient able to tolerate  COVID-19 pneumonia, resolved -Supportive care. Previously treated with steroids, remdesivir, bronchodilators  Chronic atrial fibrillation -No anticoagulation due to retroperitoneal bleed. On Coreg 3.125 mg twice daily for rate control. Rate is controlled on Coreg  Chronic systolic congestive heart failure Continue Coreg Hold off losartan due to soft blood pressure Continue torsemide to maintain euvolemia  Ejection fraction 40-45% on the echo performed on 11/12.  Diabetes mellitus type 2, hyperglycemia -Holding outpatient medication.  Continue Lantus to 20 units.   Increase NovoLog dose to  4 units every 4 hours with continuous tube feeding   GERD -PPI  Hyperlipidemia -Statin  DVT prophylaxis: SCDs Code Status:Full code Family Communication:None Disposition Plan: Possible discharge to SNF within the next 24 to 48 hours.  Pending bed placement.  Code Status: Full  Family Communication: None at bedside  Disposition Plan: SNF pending bed assignment and placement.  Consultants:  Neurosurgery  PCCM  Cardiology  Dementia radiology  Procedures:  PEG tube placement by interventional radiology  Antimicrobials:  None  DVT prophylaxis: SCD only   Objective: Vitals:   06/13/19 2331 06/14/19 0000 06/14/19 0354 06/14/19 0400  BP:  (!) 130/46  (!) 109/96  Pulse:  74  83  Resp:  18  15  Temp: 99.8 F (37.7 C)  99.1 F (37.3 C)   TempSrc: Oral  Oral   SpO2:  96%  98%  Weight:      Height:        Intake/Output Summary (Last 24 hours) at 06/14/2019 0751 Last data filed at 06/14/2019 0357 Gross per 24 hour  Intake 720 ml  Output 2551 ml  Net -1831 ml   Filed Weights   06/09/19 0500 06/10/19 0500 06/12/19 0650  Weight: 119 kg 117.4 kg 115.4 kg   Body mass index is 37.57 kg/m.  Exam:  . General: 68 y.o. year-old male well developed well nourished in no acute distress.  Alert and oriented x3.  Nontoxic in appearance, minimal distress due to pain while attempting to transition to bed this morning . Cardiovascular: Regular rate and rhythm with no rubs or gallops.  No thyromegaly or JVD noted.   Marland Kitchen Respiratory: Or inspiratory effort, bibasilar coarse breath sounds without overt wheeze or rales . Abdomen: PEG tube insertion site erythematous, bandage wet with tan-colored fluid, questionably tube feed versus purulence . Musculoskeletal: No lower extremity edema. 2/4 pulses in all 4 extremities. . Skin: No ulcerative lesions noted or rashes,  Data Reviewed: CBC: Recent Labs  Lab 06/10/19 0456 06/11/19 0622 06/12/19 0510 06/13/19 0600 06/14/19  0617  WBC 13.7* 11.3* 9.4 10.9* 11.8*  HGB 9.1* 9.3* 8.8* 9.0* 8.9*  HCT 30.1* 31.4* 29.7* 30.0* 30.3*  MCV 93.2 94.9 94.0 93.2 94.1  PLT 199 184 190 230 218   Basic Metabolic Panel: Recent Labs  Lab 06/10/19 0456 06/11/19 0622 06/12/19 0510 06/13/19 0600 06/14/19 0617  NA 140 138 140 137 137  K 4.6 4.5 4.4 4.5 5.2*  CL 101 99 98 96* 96*  CO2 28 30 34* 35* 34*  GLUCOSE 265* 264* 205* 173* 220*  BUN 31* 35* 30* 29* 30*  CREATININE 0.70 0.78 0.59* 0.69 0.74  CALCIUM 8.2* 8.2* 8.2* 8.0* 8.1*  MG 2.0 2.2 2.2 2.3 2.1   CBG: Recent Labs  Lab 06/13/19 1658 06/13/19 2012 06/13/19 2328 06/13/19 2330 06/14/19 0354  GLUCAP 263* 264* 428* 364* 227*   Urine analysis:    Component Value Date/Time   COLORURINE YELLOW 05/19/2019 1355   APPEARANCEUR CLEAR 05/19/2019 1355   APPEARANCEUR Clear 07/23/2017   LABSPEC 1.013 05/19/2019 1355   PHURINE 5.0 05/19/2019 1355   GLUCOSEU NEGATIVE 05/19/2019 1355   GLUCOSEU 500 (A) 07/14/2017 1053   HGBUR SMALL (A) 05/19/2019 1355   BILIRUBINUR NEGATIVE 05/19/2019 1355   BILIRUBINUR Negative 07/23/2017   KETONESUR NEGATIVE 05/19/2019 1355   PROTEINUR NEGATIVE 05/19/2019 1355   UROBILINOGEN 0.2 07/14/2017 1053   NITRITE NEGATIVE 05/19/2019 1355   LEUKOCYTESUR NEGATIVE 05/19/2019 1355    No results found for this or any previous visit (from the past 240 hour(s)).    Studies: Dg Abd 1 View  Result  Date: 06/13/2019 CLINICAL DATA:  Abdominal pain EXAM: ABDOMEN - 1 VIEW COMPARISON:  CT abdomen pelvis dated 06/05/2019 FINDINGS: The bowel gas pattern is nonobstructive. Air-fluid levels and free intraperitoneal air cannot be excluded on this supine exam. Lumbar spine fixation hardware is noted. Median sternotomy wires and a right hip arthroplasty are seen. Degenerative changes are seen in the spine and left hip. IMPRESSION: Nonobstructive bowel gas pattern. Electronically Signed   By: Zerita Boers M.D.   On: 06/13/2019 15:25   Dg Chest Port 1  View  Result Date: 06/13/2019 CLINICAL DATA:  Chest pain EXAM: PORTABLE CHEST 1 VIEW COMPARISON:  Chest radiograph dated 06/07/2019 FINDINGS: The heart is enlarged. Median sternotomy wires and lumbar fixation hardware are seen. A right upper extremity peripherally inserted central venous catheter tip overlies the superior vena cava. Bilateral lower lung predominant airspace opacities have decreased and are now mild. There is no pleural effusion or pneumothorax. IMPRESSION: 1. Mild bilateral lower lung predominant airspace opacities are decreased since 06/07/2019. 2. Cardiomegaly. Electronically Signed   By: Zerita Boers M.D.   On: 06/13/2019 15:27    Scheduled Meds: . sodium chloride   Intravenous Once  . ascorbic acid  500 mg Per Tube Daily  . carvedilol  3.125 mg Per Tube BID WC  . chlorhexidine  15 mL Mouth Rinse BID  . Chlorhexidine Gluconate Cloth  6 each Topical Daily  . feeding supplement (OSMOLITE 1.5 CAL)  474 mL Per Tube TID  . feeding supplement (PRO-STAT SUGAR FREE 64)  30 mL Per Tube BID  . fluticasone  2 spray Each Nare Daily  . fluticasone furoate-vilanterol  1 puff Inhalation Daily  . free water  200 mL Per Tube Q4H  . insulin aspart  0-20 Units Subcutaneous Q4H  . insulin aspart  4 Units Subcutaneous Q4H  . insulin glargine  20 Units Subcutaneous Daily  . liver oil-zinc oxide   Topical QID  . loratadine  10 mg Per Tube QPM  . mouth rinse  15 mL Mouth Rinse q12n4p  . multivitamin with minerals  1 tablet Per Tube Daily  . pantoprazole sodium  40 mg Per Tube Daily  . simvastatin  20 mg Per Tube q morning - 10a  . sodium chloride flush  10-40 mL Intracatheter Q12H  . sodium chloride flush  3 mL Intravenous Q12H  . sulfamethoxazole-trimethoprim  1 tablet Per Tube Q12H  . tamsulosin  0.4 mg Oral QPC breakfast  . torsemide  20 mg Per Tube q1800  . umeclidinium bromide  1 puff Inhalation Daily  . Vilazodone HCl  40 mg Oral Daily    Continuous Infusions: . sodium chloride  250 mL (06/03/19 2141)     LOS: 28 days     Little Ishikawa, DO [facility]  To reach me or the doctor on call, go to: [email or web address] Password [email or web address]  06/14/2019, 7:51 AM

## 2019-06-14 NOTE — Consult Note (Signed)
WOC Nurse Consult Note: Assessed by neurosurgery and found thoracolumbar wound dehiscence, possible superficial infection vs underlying seroma.  Proximal end staples removed, distal end had been removed as well.  The area with noted skin breakdown with remaining staples in place.  Added Xeroform gauze and foam today.  Leave in place until Wednesday and place to remove remaining staples then.  Patient and bedside nurse informed of plan and Xeroform implemented.  No further action needed until Wednesday.   Reason for Consult: Dehiscence to spinal fusion site.  Some purulence noted when staples removed.  I cannot appreciate any purulence, fluctuance or induration.  Some erythema noted.  Wound type:surgical  Pressure Injury POA:NA Measurement: 22 cm surgical incision site with some remaining staples.  Proximal end with 3 cm dehiscence.  Cannot visualize wound bed.  Cannot appreciate wound depth with cotton tip applicator.  Wound ZOX:WRUEAV to visualize Drainage (amount, consistency, odor) None noted Periwound:erythema Dressing procedure/placement/frequency: Cleanse surgical incision to spine with NS.  Cover with Xeroform gauze and secure with foam dressing.  Re-assess Wednesday 06/16/19 [place] team will follow.  Domenic Moras MSN, RN, FNP-BC CWON Wound, Ostomy, Continence Nurse Pager [phone]

## 2019-06-15 LAB — GLUCOSE, CAPILLARY
Glucose-Capillary: 132 mg/dL — ABNORMAL HIGH (ref 70–99)
Glucose-Capillary: 135 mg/dL — ABNORMAL HIGH (ref 70–99)
Glucose-Capillary: 151 mg/dL — ABNORMAL HIGH (ref 70–99)
Glucose-Capillary: 156 mg/dL — ABNORMAL HIGH (ref 70–99)
Glucose-Capillary: 158 mg/dL — ABNORMAL HIGH (ref 70–99)
Glucose-Capillary: 173 mg/dL — ABNORMAL HIGH (ref 70–99)

## 2019-06-15 LAB — BASIC METABOLIC PANEL
Anion gap: 8 (ref 5–15)
BUN: 29 mg/dL — ABNORMAL HIGH (ref 8–23)
CO2: 34 mmol/L — ABNORMAL HIGH (ref 22–32)
Calcium: 8.1 mg/dL — ABNORMAL LOW (ref 8.9–10.3)
Chloride: 94 mmol/L — ABNORMAL LOW (ref 98–111)
Creatinine, Ser: 0.72 mg/dL (ref 0.61–1.24)
GFR calc Af Amer: >60 mL/min (ref >60)
GFR calc non Af Amer: >60 mL/min (ref >60)
Glucose, Bld: 159 mg/dL — ABNORMAL HIGH (ref 70–99)
Potassium: 4.6 mmol/L (ref 3.5–5.1)
Sodium: 136 mmol/L (ref 135–145)

## 2019-06-15 LAB — CBC
HCT: 29.3 % — ABNORMAL LOW (ref 39.0–52.0)
Hemoglobin: 8.8 g/dL — ABNORMAL LOW (ref 13.0–17.0)
MCH: 27.8 pg (ref 26.0–34.0)
MCHC: 30 g/dL (ref 30.0–36.0)
MCV: 92.4 fL (ref 80.0–100.0)
Platelets: 217 10*3/uL (ref 150–400)
RBC: 3.17 MIL/uL — ABNORMAL LOW (ref 4.22–5.81)
RDW: 17.2 % — ABNORMAL HIGH (ref 11.5–15.5)
WBC: 8.8 10*3/uL (ref 4.0–10.5)
nRBC: 0 % (ref 0.0–0.2)

## 2019-06-15 NOTE — Progress Notes (Addendum)
PROGRESS NOTE  Cody Hale QVZ:[id number] DOB: 08/31/1950 DOA: 05/06/2019 PCP: [name], MD  HPI/Recap of past 18 hours: 68 year old male with history of combined systolic and diastolic congestive heart failure, bioprosthetic mitral valve, endocarditis 2014, atrial fibrillation, A. fib, COPD, diabetes, who presented from home after a fall at home his MRI in the ER showed lumbar fracture and transferred to [facility] was positive for Covid neurosurgery was consulted his hospital course was complicated by gram-positive strep bacteremia and completed antibiotics he underwent spinal fusion with placement of a drain by neurosurgery.  After his spinal fusion it was difficult to ventilate him fusion  so he required to be intubated.  His  hospital course was further complicated by incidental finding of retroperitoneal hematoma so his heparin drip was stopped, IR placed a PEG tube on June 04, 2019  Subjective: Patient seen and examined he is lying in bed sonorous and quite somnolent, just received narcotics for back pain.  Declines headache, fever, chills, nausea, vomiting, diarrhea, constipation.  Assessment/Plan: Active Problems:   OSA (obstructive sleep apnea)   Type II diabetes mellitus with manifestations (HCC)   Chronic systolic CHF (congestive heart failure) (HCC)   Hyperlipidemia with target LDL less than 70   Chronic atrial fibrillation   Chronic respiratory failure with hypoxia (HCC)   Unstable burst fracture of unspecified lumbar vertebra, initial encounter for closed fracture (HCC)   Closed unstable burst fracture of lumbar vertebra (HCC)   Closed unstable burst fracture of first lumbar vertebra (HCC)   Pressure injury of skin   Bacteremia   Hyponatremia   NSVT (nonsustained ventricular tachycardia) ([place])   T11/L4 fusion after her lumbar fracture 05/25/19 Wound dehiscence noted 06/14/19 -Neurosurgery continues to follow, appreciate insight and recommendations  -Seen by  PT OT with recommendation for SNF - defer to CSW/CM -Continue bactrim DS BID stop date 06/20/2019 -Staple removal concerning for purulent material below -follow wound cultures - prelim negative -WOC following - likely to readdress removal of staples 06/16/19 per their documentation  Dysphagia requiring PEG tube placement 06/04/2019 -Continue aspiration precaution.  -Questionable PEG tube dysfunction previously, PEG tube appears to be in adequate position per IR imaging, continue tube feeds free water flushes and meds -follow for issues -Given ongoing leakage around tube insertion site will order swab/culture -PEG tube feeding per nutrition team.  Retroperitoneal hematoma affecting gluteus musculature bilaterally -Incidental finding on the scan which was done prior to PEG tube placement.  -Currently off anticoagulation. No surgical intervention indicated. -Hemoglobin stable around 9 -No sign of overt bleeding. -Continue to hold anticoagulation for now.  Healthcare acquired pneumonia, Corynebacterium -Completed treatment with cefepime/vancomycin -Clinically improved  Acute hypoxic respiratory failure, likely in the setting of above, POA ongoing -Likely silent aspiration. Supportive care. As needed bronchodilators. -Maintain O2 saturation greater than 92% -Home O2 evaluation for DC planning -ambulatory O2 screen if patient able to tolerate  COVID-19 pneumonia, resolved -Supportive care. Previously treated with steroids, remdesivir, bronchodilators  Chronic atrial fibrillation -No anticoagulation due to retroperitoneal bleed.  -On Coreg 3.125 mg twice daily for rate control.  Chronic systolic congestive heart failure -Continue Coreg -Hold off losartan due to soft blood pressure -Continue torsemide to maintain euvolemia -Ejection fraction 40-45% on the echo performed on 11/12.  Diabetes mellitus type 2, hyperglycemia -Holding outpatient medication.  -Continue Lantus to  20 units.   Increase NovoLog dose to 4 units every 4 hours with continuous tube feeding   GERD -PPI  Hyperlipidemia -Statin  DVT prophylaxis:  SCDs Code Status:Full code Family Communication:None Disposition Plan:Discharge to SNF pending futher evaluation and treatment as dictated by neurosurgery - plan to evaluate postop wound on 06/16/2019.  Depending on that evaluation and possible need for further prevention; if their evaluation is unremarkable medically patient will likely be stable for discharge to SNF as early as 12/9-12/10/20.  Code Status: Full  Family Communication: None at bedside  Consultants:  Neurosurgery  PCCM  Cardiology  Interventional radiology  Procedures:  PEG tube placement by interventional radiology 11/27  Antimicrobials:  None  DVT prophylaxis: SCD only   Objective: Vitals:   06/15/19 0013 06/15/19 0439 06/15/19 0500 06/15/19 0746  BP:    (!) 108/50  Pulse:    80  Resp:    18  Temp: 97.9 F (36.6 C) 98.8 F (37.1 C)  97.7 F (36.5 C)  TempSrc: Oral Oral  Oral  SpO2:    100%  Weight:   118.3 kg   Height:        Intake/Output Summary (Last 24 hours) at 06/15/2019 0819 Last data filed at 06/15/2019 0542 Gross per 24 hour  Intake 3 ml  Output 1725 ml  Net -1722 ml   Filed Weights   06/10/19 0500 06/12/19 0650 06/15/19 0500  Weight: 117.4 kg 115.4 kg 118.3 kg   Body mass index is 38.51 kg/m.  Exam:  . General: 68 y.o. year-old male well developed well nourished in no acute distress.  Alert and oriented x3.  Nontoxic in appearance, somewhat somnolent after recent narcotics but interactive and follows commands appropriately  . Cardiovascular: Regular rate and rhythm with no rubs or gallops.  No thyromegaly or JVD noted.   Marland Kitchen Respiratory: Poor inspiratory effort, bibasilar coarse breath sounds without overt wheeze or rales . Abdomen: Nontender nondistended, PEG tube bandage green/brown discoloration at insertion site .  Musculoskeletal: No lower extremity edema. 2/4 pulses in all 4 extremities. . Skin: No ulcerative lesions noted or rashes,  Data Reviewed: CBC: Recent Labs  Lab 06/11/19 0622 06/12/19 0510 06/13/19 0600 06/14/19 0617 06/15/19 0414  WBC 11.3* 9.4 10.9* 11.8* 8.8  HGB 9.3* 8.8* 9.0* 8.9* 8.8*  HCT 31.4* 29.7* 30.0* 30.3* 29.3*  MCV 94.9 94.0 93.2 94.1 92.4  PLT 184 190 230 218 217   Basic Metabolic Panel: Recent Labs  Lab 06/10/19 0456 06/11/19 0622 06/12/19 0510 06/13/19 0600 06/14/19 0617 06/15/19 0414  NA 140 138 140 137 137 136  K 4.6 4.5 4.4 4.5 5.2* 4.6  CL 101 99 98 96* 96* 94*  CO2 28 30 34* 35* 34* 34*  GLUCOSE 265* 264* 205* 173* 220* 159*  BUN 31* 35* 30* 29* 30* 29*  CREATININE 0.70 0.78 0.59* 0.69 0.74 0.72  CALCIUM 8.2* 8.2* 8.2* 8.0* 8.1* 8.1*  MG 2.0 2.2 2.2 2.3 2.1  --    CBG: Recent Labs  Lab 06/14/19 1657 06/14/19 2014 06/15/19 0014 06/15/19 0441 06/15/19 0743  GLUCAP 208* 135* 132* 156* 151*   Urine analysis:    Component Value Date/Time   COLORURINE YELLOW 05/19/2019 1355   APPEARANCEUR CLEAR 05/19/2019 1355   APPEARANCEUR Clear 07/23/2017   LABSPEC 1.013 05/19/2019 1355   PHURINE 5.0 05/19/2019 1355   GLUCOSEU NEGATIVE 05/19/2019 1355   GLUCOSEU 500 (A) 07/14/2017 1053   HGBUR SMALL (A) 05/19/2019 1355   BILIRUBINUR NEGATIVE 05/19/2019 1355   BILIRUBINUR Negative 07/23/2017   KETONESUR NEGATIVE 05/19/2019 1355   PROTEINUR NEGATIVE 05/19/2019 1355   UROBILINOGEN 0.2 07/14/2017 1053   NITRITE NEGATIVE 05/19/2019  1355   LEUKOCYTESUR NEGATIVE 05/19/2019 1355    Recent Results (from the past 240 hour(s))  Aerobic/Anaerobic Culture (surgical/deep wound)     Status: None (Preliminary result)   Collection Time: 06/14/19 11:54 AM   Specimen: Back  Result Value Ref Range Status   Specimen Description BACK  Final   Special Requests NONE  Final   Gram Stain   Final    NO WBC SEEN NO ORGANISMS SEEN Performed at [facility]  Lab, [address], [place], [place] [address]    Culture PENDING  Incomplete   Report Status PENDING  Incomplete      Studies: Dg Abdomen Peg Tube Location  Result Date: 06/14/2019 CLINICAL DATA:  PEG tube malfunction EXAM: ABDOMEN - 1 VIEW COMPARISON:  None. FINDINGS: There is oral contrast material hand injected through the gastrostomy tube opacifying the stomach. No extraluminal contrast is observed. There is no bowel dilatation to suggest obstruction. There is no evidence of pneumoperitoneum, portal venous gas or pneumatosis. There are no pathologic calcifications along the expected course of the ureters. There is posterior lumbar fusion hardware present. There is a right hip arthroplasty. IMPRESSION: Percutaneous gastrostomy tube in satisfactory position. Electronically Signed   By: Elige Ko   On: 06/14/2019 17:15    Scheduled Meds: . sodium chloride   Intravenous Once  . ascorbic acid  500 mg Per Tube Daily  . carvedilol  3.125 mg Per Tube BID WC  . chlorhexidine  15 mL Mouth Rinse BID  . Chlorhexidine Gluconate Cloth  6 each Topical Daily  . feeding supplement (OSMOLITE 1.5 CAL)  474 mL Per Tube TID  . feeding supplement (PRO-STAT SUGAR FREE 64)  30 mL Per Tube BID  . fluticasone  2 spray Each Nare Daily  . fluticasone furoate-vilanterol  1 puff Inhalation Daily  . free water  200 mL Per Tube Q4H  . insulin aspart  0-20 Units Subcutaneous Q4H  . insulin aspart  4 Units Subcutaneous Q4H  . insulin glargine  20 Units Subcutaneous Daily  . liver oil-zinc oxide   Topical QID  . loratadine  10 mg Per Tube QPM  . mouth rinse  15 mL Mouth Rinse q12n4p  . multivitamin with minerals  1 tablet Per Tube Daily  . pantoprazole sodium  40 mg Per Tube Daily  . simvastatin  20 mg Per Tube q morning - 10a  . sodium chloride flush  10-40 mL Intracatheter Q12H  . sodium chloride flush  3 mL Intravenous Q12H  . sulfamethoxazole-trimethoprim  1 tablet Per Tube Q12H  . tamsulosin  0.4 mg Oral  QPC breakfast  . torsemide  20 mg Per Tube q1800  . umeclidinium bromide  1 puff Inhalation Daily  . Vilazodone HCl  40 mg Oral Daily    Continuous Infusions: . sodium chloride 250 mL (06/03/19 2141)     LOS: 40 days   Azucena Fallen, DO [facility]  To reach me or the doctor on call, go to: [email or web address] Password [facility]  06/15/2019, 8:19 AM

## 2019-06-16 LAB — BASIC METABOLIC PANEL
Anion gap: 9 (ref 5–15)
BUN: 32 mg/dL — ABNORMAL HIGH (ref 8–23)
CO2: 35 mmol/L — ABNORMAL HIGH (ref 22–32)
Calcium: 8.3 mg/dL — ABNORMAL LOW (ref 8.9–10.3)
Chloride: 96 mmol/L — ABNORMAL LOW (ref 98–111)
Creatinine, Ser: 0.78 mg/dL (ref 0.61–1.24)
GFR calc Af Amer: >60 mL/min (ref >60)
GFR calc non Af Amer: >60 mL/min (ref >60)
Glucose, Bld: 271 mg/dL — ABNORMAL HIGH (ref 70–99)
Potassium: 4.7 mmol/L (ref 3.5–5.1)
Sodium: 140 mmol/L (ref 135–145)

## 2019-06-16 LAB — CBC
HCT: 30.9 % — ABNORMAL LOW (ref 39.0–52.0)
Hemoglobin: 8.9 g/dL — ABNORMAL LOW (ref 13.0–17.0)
MCH: 27.2 pg (ref 26.0–34.0)
MCHC: 28.8 g/dL — ABNORMAL LOW (ref 30.0–36.0)
MCV: 94.5 fL (ref 80.0–100.0)
Platelets: 226 10*3/uL (ref 150–400)
RBC: 3.27 MIL/uL — ABNORMAL LOW (ref 4.22–5.81)
RDW: 16.8 % — ABNORMAL HIGH (ref 11.5–15.5)
WBC: 6.1 10*3/uL (ref 4.0–10.5)
nRBC: 0 % (ref 0.0–0.2)

## 2019-06-16 LAB — GLUCOSE, CAPILLARY
Glucose-Capillary: 146 mg/dL — ABNORMAL HIGH (ref 70–99)
Glucose-Capillary: 187 mg/dL — ABNORMAL HIGH (ref 70–99)
Glucose-Capillary: 217 mg/dL — ABNORMAL HIGH (ref 70–99)
Glucose-Capillary: 229 mg/dL — ABNORMAL HIGH (ref 70–99)
Glucose-Capillary: 234 mg/dL — ABNORMAL HIGH (ref 70–99)
Glucose-Capillary: 247 mg/dL — ABNORMAL HIGH (ref 70–99)

## 2019-06-16 MED ORDER — METHOCARBAMOL 1000 MG/10ML IJ SOLN
500.0000 mg | Freq: Three times a day (TID) | INTRAVENOUS | Status: DC | PRN
  Administered 2019-06-16 – 2019-06-18 (×3): 500 mg via INTRAVENOUS
  Filled 2019-06-16: qty 5
  Filled 2019-06-16: qty 500
  Filled 2019-06-16: qty 5
  Filled 2019-06-16: qty 500

## 2019-06-16 NOTE — Consult Note (Signed)
[place] Nurse wound follow up Wound type:surigcal incision to spine.  Some remaining staples to be removed today. Wound is less friable today.   Measurement: 22 cm staple line.   Wound bed:not visible Drainage (amount, consistency, odor) minimal bleeding with staple remover.  Periwound: Some friable tissue to periwound . This is being treated with Xerform gauze and foam dressing.  Dressing procedure/placement/frequency:Staple removed.  Cleansed with betadine and Xeroform gauze applied.  Covered with silicone foam dressing. Change M/W/F Will not follow at this time.  Please re-consult if needed.  Domenic Moras MSN, RN, FNP-BC CWON Wound, Ostomy, Continence Nurse Pager [phone]

## 2019-06-16 NOTE — Progress Notes (Addendum)
IR.  Patient with history of dysphagia s/p percutaneous gastrostomy tube placement in IR 06/04/2019 by Dr. Kathlene Cote. Received consult regarding management of infected gastrostomy tube.  Went to 4NP to evaluate patient bedside. Patient awake and alert laying in bed. Gastrostomy tube site stable with bumper cinched to skin, with surrounding erythema and small amount of purulent drainage within track. Per patient, site is only tender in track. No active bleeding.  Discussed case with Dr. Pascal Lux. Given that gastrostomy tube is newly placed, recommend treating through possible infection with antibiotics. It is too early to remove or exchange gastrostomy tube- need a minimum of 6-8 weeks after placement before removal/exchange. Ok to use tube at this timeCaryl Pina, RN aware. Will delete order. Dr. Ree Kida made aware.  Please call IR with questions/concerns.   Bea Graff Nysir Fergusson, PA-C 06/16/2019, 3:03 PM

## 2019-06-16 NOTE — Progress Notes (Signed)
Physical Therapy Treatment Patient Details Name: Cody Hale MRN: [id number] DOB: 01-04-1951 Today's Date: 06/16/2019    History of Present Illness Pt is a 68 yo male presented 05/06/19 to ED after fall  (he tripped over some blankets), onto his buttocks with severe back pain;  CT lumbar spine showing unstable fracture L1-2 and likely ankylosing spondylitis; tested +COVID and developed fever and required TEE to r/o endocarditis prior to surgery;  11/17 underwent open reduction internal fixation of L1-L2 Chance fracture with multilevel spine stabilization from T11-L4. Remained intubated until 07/25/18 PMHx: Afib, MVR, SVT, anxiety, CAD, COPD, CHF, HTN,     PT Comments    Pt complaining of severe back pain today, limited to EOB and in-bed activity only this session. PT focus of session was on log roll technique and preventative positioning in bed, pt requiring mod-total assist +2 for all bed mobility today. RN notified of pt's spasms, PT continuing to recommend SNF and will continue to follow acutely.    Follow Up Recommendations  SNF     Equipment Recommendations  Other (comment)    Recommendations for Other Services       Precautions / Restrictions Precautions Precautions: Back;Fall Precaution Booklet Issued: No Precaution Comments: requiring cues for back precautions, log roll technique Required Braces or Orthoses: Spinal Brace Spinal Brace: Thoracolumbosacral orthotic;Applied in sitting position Spinal Brace Comments: Pt sitting EOB <1 minute and refusing TLSO Restrictions Weight Bearing Restrictions: No    Mobility  Bed Mobility Overal bed mobility: Needs Assistance Bed Mobility: Rolling;Sidelying to Sit;Sit to Sidelying Rolling: Mod assist;+2 for physical assistance;+2 for safety/equipment Sidelying to sit: Total assist;+2 for physical assistance     Sit to sidelying: +2 for physical assistance;+2 for safety/equipment;Max assist General bed mobility comments: mod assist  for rolling bilaterally for progression to EOB and positioning, max-total assist +2 for sidelying<>sit for trunk and LE management, positioning in bed upon return to supine.  Transfers Overall transfer level: (NT - pt in severe pain)               General transfer comment: deferred as pt in too severe of pain and denying need to put brace on even after education  Ambulation/Gait                 Stairs             Wheelchair Mobility    Modified Rankin (Stroke Patients Only)       Balance Overall balance assessment: Needs assistance Sitting-balance support: Bilateral upper extremity supported;Feet supported Sitting balance-Leahy Scale: Poor Sitting balance - Comments: posterior leaning in sitting requiring mod assist to correct from PT/OT; tolerance for EOB sitting ~2 minutes only due to pain Postural control: Posterior lean                                  Cognition Arousal/Alertness: Awake/alert Behavior During Therapy: Anxious Overall Cognitive Status: Impaired/Different from baseline Area of Impairment: Memory;Safety/judgement;Problem solving;Following commands                   Current Attention Level: Sustained Memory: Decreased recall of precautions;Decreased short-term memory Following Commands: Follows one step commands consistently Safety/Judgement: Decreased awareness of safety Awareness: Intellectual Problem Solving: Decreased initiation;Requires verbal cues;Requires tactile cues;Difficulty sequencing General Comments: Pt following all commands today, limited by pain      Exercises      General Comments General comments (skin integrity, edema,  etc.): VSS.      Pertinent Vitals/Pain Pain Assessment: Faces Faces Pain Scale: Hurts whole lot Pain Location: low back Pain Descriptors / Indicators: Spasm Pain Intervention(s): Limited activity within patient's tolerance;Monitored during session;Repositioned;Patient  requesting pain meds-RN notified    Home Living                      Prior Function            PT Goals (current goals can now be found in the care plan section) Acute Rehab PT Goals Patient Stated Goal: none stated PT Goal Formulation: With patient Time For Goal Achievement: 06/23/19 Potential to Achieve Goals: Fair Progress towards PT goals: Progressing toward goals    Frequency    Min 5X/week      PT Plan Current plan remains appropriate    Co-evaluation PT/OT/SLP Co-Evaluation/Treatment: Yes Reason for Co-Treatment: For patient/therapist safety;To address functional/ADL transfers PT goals addressed during session: Mobility/safety with mobility OT goals addressed during session: ADL's and self-care      AM-PAC PT "6 Clicks" Mobility   Outcome Measure  Help needed turning from your back to your side while in a flat bed without using bedrails?: A Lot Help needed moving from lying on your back to sitting on the side of a flat bed without using bedrails?: Total Help needed moving to and from a bed to a chair (including a wheelchair)?: Total Help needed standing up from a chair using your arms (Hale.g., wheelchair or bedside chair)?: Total Help needed to walk in hospital room?: Total Help needed climbing 3-5 steps with a railing? : Total 6 Click Score: 7    End of Session Equipment Utilized During Treatment: Back brace(pt refused to use back brace, states it is too uncomfortable) Activity Tolerance: Patient limited by pain;Patient limited by fatigue Patient left: in bed;with bed alarm set(SCDs taken off due to wear marks on LEs) Nurse Communication: Mobility status PT Visit Diagnosis: Other abnormalities of gait and mobility (R26.89);Muscle weakness (generalized) (M62.81);Pain;History of falling (Z91.81) Pain - part of body: Hip;Leg     Time: 1027-2536 PT Time Calculation (min) (ACUTE ONLY): 29 min  Charges:  $Therapeutic Activity: 8-22 mins                      Cody Hale, PT Acute Rehabilitation Services Pager [phone]  Office [phone]  Cody Hale 06/16/2019, 5:03 PM

## 2019-06-16 NOTE — Progress Notes (Signed)
  NEUROSURGERY PROGRESS NOTE   Wound check note No drainage today. Gram stain without organism. Cultures negative to date. Would complete 10 day course of Bactrim due to open wound. WOC has seen and evaluated the patient. They will follow up today. Appreciate assistance. No plan to take patient back to OR.

## 2019-06-16 NOTE — TOC Progression Note (Addendum)
Transition of Care [facility]) - Progression Note    Patient Details  Name: Cody Hale MRN: [id number] Date of Birth: 04-21-1951  Transition of [facility]) CM/SW [place], [place] Phone Number: 06/16/2019, 4:25 PM  Clinical Narrative:     Wandra Feinstein can admit patient tomorrow. Per SNF another Covid not needed since the patient tested positive in November, as long as the patient does not have any covid symptoms.    CSW left voice message with patient's son, [name].  Thurmond Butts, MSW, [facility] Clinical Social Worker [phone]   Expected Discharge Plan: Skilled Nursing Facility Barriers to Discharge: Continued Medical Work up, Ship broker  Expected Discharge Plan and Services Expected Discharge Plan: [place]       Living arrangements for the past 2 months: Single Family Home                                       Social Determinants of Health (SDOH) Interventions    Readmission Risk Interventions No flowsheet data found.

## 2019-06-16 NOTE — Progress Notes (Signed)
Inpatient Diabetes Program Recommendations  AACE/ADA: New Consensus Statement on Inpatient Glycemic Control (2015)  Target Ranges:  Prepandial:   less than 140 mg/dL      Peak postprandial:   less than 180 mg/dL (1-2 hours)      Critically ill patients:  140 - 180 mg/dL   Lab Results  Component Value Date   GLUCAP 247 (H) 06/16/2019   HGBA1C 7.4 (H) 03/24/2019    Review of Glycemic Control Results for JORAN, KALLAL "DAVID" (MRN [id number]) as of 06/16/2019 09:56  Ref. Range 06/15/2019 07:43 06/15/2019 11:36 06/15/2019 17:39 06/15/2019 20:23 06/16/2019 00:32 06/16/2019 03:35 06/16/2019 08:44  Glucose-Capillary Latest Ref Range: 70 - 99 mg/dL 151 (H) 158 (H) 135 (H) 173 (H) 229 (H) 217 (H) 247 (H)    Diabetes history: DM 2 Outpatient Diabetes medications:  Trulicity 1.5 mg weekly, Novolog 5 units tid with meals, Tresiba 50 units daily Current orders for Inpatient glycemic control:  Novolog Resistant q 4 hours Novolog 4 units q 4 hours Lantus 20 units daily  Osmolite 474 ml Tube feed bolus tid (1000, 1600, 2200)  Inpatient Diabetes Program Recommendations:     Consider increasing Novolog tube feed coverage to 6-8 units tid with tube feed boluses (1000, 1600, 2200).   Thanks  Tama Headings RN, MSN, BC-ADM Inpatient Diabetes Coordinator Team Pager [phone] (8a-5p)

## 2019-06-16 NOTE — Progress Notes (Signed)
  Speech Language Pathology Treatment: Dysphagia  Patient Details Name: Cody Hale MRN: [id number] DOB: 31-Mar-1951 Today's Date: 06/16/2019 Time: 8416-6063 SLP Time Calculation (min) (ACUTE ONLY): 19 min  Assessment / Plan / Recommendation Clinical Impression  Pt is resistant to sitting more upright but does so with encouragement and assistance from SLP and RN. Ice chips and small spoonfuls of water were provided with Min cues to perform multiple, effortful swallows. Occasional delayed coughing is also noted. RN reports that se has been offering ice chips intermittently as has been recommended to increase use of swallowing musculature. SLP reinforced precautions - particularly for oral care first. Education was also reinforced to pt, who kept asking for other types of beverages. Recommend continuing NPO except for a few ice chips or a few spoonfuls of water given sparingly and after oral care.    HPI HPI: Cody Hale is a 68 y.o. 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 Hospital course complicated by [id number] infection positive with no symptoms though he did complete 5 days of remdesivir and full course of heparin drip GBS bacteremia IV penicillin. Had FEES 11/19- difficulty viewing anatomy sufficiently- NPO recommended. Repeat instrumental wi MBS.        SLP Plan  Continue with current plan of care       Recommendations  Diet recommendations: NPO;Other(comment)(few ice chips or spoonfuls of water after oral care) Liquids provided via: Teaspoon Medication Administration: Via alternative means                Oral Care Recommendations: Oral care  QID;Oral care prior to ice chip/H20 Follow up Recommendations: Skilled Nursing facility SLP Visit Diagnosis: Dysphagia, oropharyngeal phase (R13.12) Plan: Continue with current plan of care       GO                Cody Hale Cody Hale  06/16/2019, 11:53 AM  Cody Hale, M.A. Cody Hale Acute Environmental education officer [phone] Office [phone]

## 2019-06-16 NOTE — Progress Notes (Signed)
PROGRESS NOTE    Cody Hale  ZOX:[id number] DOB: 1950-09-28 DOA: 05/06/2019 PCP: Cody Lima, MD   Brief Narrative:  68 year old male with history of combined systolic and diastolic congestive heart failure, bioprosthetic mitral valve, endocarditis 2014, atrial fibrillation, A. fib, COPD, diabetes, who presented from home after a fall at home his MRI in the ER showed lumbar fracture and transferred to [facility] was positive for Covid neurosurgery was consulted his hospital course was complicated by gram-positive strep bacteremia and completed antibiotics he underwent spinal fusion with placement of a drain by neurosurgery.  After his spinal fusion it was difficult to ventilate him fusion  so he required to be intubated.  His  hospital course was further complicated by incidental finding of retroperitoneal hematoma so his heparin drip was stopped, IR placed a PEG tube on June 04, 2019  Assessment & Plan   T11/L4 fusion after lumbar fracture 05/25/19 with Wound dehiscence noted 06/14/19 -Neurosurgery continues to follow, appreciate insight and recommendations  -Seen by PT and OT with recommendation for SNF - defer to CSW/CM -Continue bactrim DS BID stop date 06/20/2019 -Staple removal concerning for purulent material below - cultures show no growth to date -Wound care following, staples to be removed today. Wound appears less friable.  Dysphagia requiring PEG tube placement 06/04/2019 -Continue aspiration precaution.  -Questionable PEG tube dysfunction previously, PEG tube appears to be in adequate position per IR imaging, continue tube feeds free water flushes and meds -follow for issues -Given ongoing leakage around tube insertion site,  Swab/culture pending -PEG tube feeding per nutrition team -noted some erythema and drainage around PEG site- of note patient is on bactrim -IR reconsulted  Retroperitoneal hematoma affecting gluteus musculature bilaterally -Incidental finding  on the scan which was done prior to PEG tube placement.  -Currently off anticoagulation. No surgical intervention indicated. -Hemoglobin currently stable 8.9 -No sign of overt bleeding. -Continue to hold anticoagulation for now.  Healthcare acquired pneumonia, Corynebacterium -Completed treatment with cefepime/vancomycin -Clinically improved  Acute hypoxic respiratory failure, likely in the setting of above, POA ongoing -Likely silent aspiration -Continue order of care, bronchodilators as needed -Maintain oxygen saturations to greater than 90% -Patient will likely need ambulatory oxygen screen prior to discharge  COVID-19 pneumonia, resolved -Supportive care. Previously treated with steroids, remdesivir, bronchodilators  Chronic atrial fibrillation -No anticoagulation due to retroperitoneal bleed.  -On Coreg 3.125 mg twice daily for rate control.  Chronic systolic congestive heart failure -Appears to be stable and euvolemic -Continue Coreg, torsemide -Echocardiogram 05/20/2019 showed an EF of 40 to 45% -Losartan held due to soft BP  Diabetes mellitus type 2, hyperglycemia -Holding outpatient medication.  -Continue Lantus, insulin sliding scale and CBG monitoring.   GERD -Continue PPI  Hyperlipidemia -Continue statin  DVT Prophylaxis  SCDs  Code Status: Full  Family Communication: None at bedside  Disposition Plan: Admitted. Pending SNF placement  Consultants Neurosurgery Interventional radiology Cardiology PCCM  Procedures  PEG tube placement by interventional radiology 11/27  Antibiotics   Anti-infectives (From admission, onward)   Start     Dose/Rate Route Frequency Ordered Stop   06/11/19 2315  sulfamethoxazole-trimethoprim (BACTRIM DS) 800-160 MG per tablet 1 tablet     1 tablet Per Tube Every 12 hours 06/11/19 2306 06/20/19 2159   06/11/19 1315  sulfamethoxazole-trimethoprim (BACTRIM DS) 800-160 MG per tablet 1 tablet  Status:  Discontinued      1 tablet Oral Every 12 hours 06/11/19 1310 06/11/19 2306   06/06/19 2200  vancomycin (VANCOCIN)  1,500 mg in sodium chloride 0.9 % 500 mL IVPB     1,500 mg 250 mL/hr over 120 Minutes Intravenous Every 24 hours 06/06/19 1103 06/09/19 2312   06/04/19 0000  ceFAZolin (ANCEF) IVPB 2g/100 mL premix     2 g 200 mL/hr over 30 Minutes Intravenous To Radiology 06/02/19 1327 06/04/19 0704   06/03/19 2200  ceFEPIme (MAXIPIME) 2 g in sodium chloride 0.9 % 100 mL IVPB  Status:  Discontinued     2 g 200 mL/hr over 30 Minutes Intravenous Every 8 hours 06/03/19 2055 06/08/19 1435   06/03/19 2200  vancomycin (VANCOCIN) 2,000 mg in sodium chloride 0.9 % 500 mL IVPB  Status:  Discontinued     2,000 mg 250 mL/hr over 120 Minutes Intravenous Every 24 hours 06/03/19 2104 06/06/19 1103   05/25/19 1415  ceFAZolin (ANCEF) IVPB 2g/100 mL premix     2 g 200 mL/hr over 30 Minutes Intravenous Every 8 hours 05/25/19 1405 05/25/19 2209   05/25/19 0943  bacitracin 50,000 Units in sodium chloride 0.9 % 500 mL irrigation  Status:  Discontinued       As needed 05/25/19 0944 05/25/19 1324   05/15/19 1430  penicillin G potassium 12 Million Units in dextrose 5 % 500 mL continuous infusion     12 Million Units 41.7 mL/hr over 12 Hours Intravenous Every 12 hours 05/15/19 1343 05/25/19 0001   05/13/19 1000  penicillin G potassium 12 Million Units in dextrose 5 % 500 mL continuous infusion  Status:  Discontinued     12 Million Units 41.7 mL/hr over 12 Hours Intravenous Every 12 hours 05/12/19 1159 05/15/19 1343   05/11/19 0730  cefTRIAXone (ROCEPHIN) 2 g in sodium chloride 0.9 % 100 mL IVPB  Status:  Discontinued     2 g 200 mL/hr over 30 Minutes Intravenous Every 24 hours 05/11/19 0721 05/12/19 1159   05/08/19 0200  remdesivir 100 mg in sodium chloride 0.9 % 250 mL IVPB     100 mg 500 mL/hr over 30 Minutes Intravenous Every 24 hours 05/07/19 0059 05/11/19 0513   05/07/19 0100  remdesivir 200 mg in sodium chloride 0.9 % 250  mL IVPB     200 mg 500 mL/hr over 30 Minutes Intravenous Once 05/07/19 0057 05/07/19 [id number]      Subjective:   Cody Hale seen and examined today.  Patient wanting to drink a Dr. Reino Hale. Has no complaints of chest pain, shortness of breath, abdominal pain, nausea, vomiting.    Objective:   Vitals:   06/16/19 0831 06/16/19 0844 06/16/19 1034 06/16/19 1109  BP:  (!) 127/54  (!) 125/52  Pulse: 80 (!) 43 78 78  Resp: 19 (!) 21  20  Temp:  97.6 F (36.4 C)  97.8 F (36.6 C)  TempSrc:  Oral  Oral  SpO2: 97% 100%  96%  Weight:      Height:        Intake/Output Summary (Last 24 hours) at 06/16/2019 1244 Last data filed at 06/16/2019 1216 Gross per 24 hour  Intake 9225.67 ml  Output 1750 ml  Net 7475.67 ml   Filed Weights   06/12/19 0650 06/15/19 0500 06/16/19 0500  Weight: 115.4 kg 118.3 kg 115.2 kg    Exam  General: Well developed, well nourished, NAD, appears stated age  HEENT: NCAT, mucous membranes moist.   Cardiovascular: S1 S2 auscultated, RRR  Respiratory: Clear to auscultation bilaterally with equal chest rise  Abdomen: Soft, nontender, nondistended, + bowel sounds, +peg  with surrounding erythema and small amount of drainage  Extremities: warm dry without cyanosis clubbing or edema  Neuro: AAOx3, nonfocal  Psych: Pleasant, appropriate   Data Reviewed: I have personally reviewed following labs and imaging studies  CBC: Recent Labs  Lab 06/12/19 0510 06/13/19 0600 06/14/19 0617 06/15/19 0414 06/16/19 0442  WBC 9.4 10.9* 11.8* 8.8 6.1  HGB 8.8* 9.0* 8.9* 8.8* 8.9*  HCT 29.7* 30.0* 30.3* 29.3* 30.9*  MCV 94.0 93.2 94.1 92.4 94.5  PLT 190 230 218 217 226   Basic Metabolic Panel: Recent Labs  Lab 06/10/19 0456 06/11/19 0622 06/12/19 0510 06/13/19 0600 06/14/19 0617 06/15/19 0414 06/16/19 0442  NA 140 138 140 137 137 136 140  K 4.6 4.5 4.4 4.5 5.2* 4.6 4.7  CL 101 99 98 96* 96* 94* 96*  CO2 28 30 34* 35* 34* 34* 35*  GLUCOSE 265* 264* 205*  173* 220* 159* 271*  BUN 31* 35* 30* 29* 30* 29* 32*  CREATININE 0.70 0.78 0.59* 0.69 0.74 0.72 0.78  CALCIUM 8.2* 8.2* 8.2* 8.0* 8.1* 8.1* 8.3*  MG 2.0 2.2 2.2 2.3 2.1  --   --    GFR: Estimated Creatinine Clearance: 110.6 mL/min (by C-G formula based on SCr of 0.78 mg/dL). Liver Function Tests: No results for input(s): AST, ALT, ALKPHOS, BILITOT, PROT, ALBUMIN in the last 168 hours. No results for input(s): LIPASE, AMYLASE in the last 168 hours. No results for input(s): AMMONIA in the last 168 hours. Coagulation Profile: No results for input(s): INR, PROTIME in the last 168 hours. Cardiac Enzymes: No results for input(s): CKTOTAL, CKMB, CKMBINDEX, TROPONINI in the last 168 hours. BNP (last 3 results) No results for input(s): PROBNP in the last 8760 hours. HbA1C: No results for input(s): HGBA1C in the last 72 hours. CBG: Recent Labs  Lab 06/15/19 2023 06/16/19 0032 06/16/19 0335 06/16/19 0844 06/16/19 1110  GLUCAP 173* 229* 217* 247* 234*   Lipid Profile: No results for input(s): CHOL, HDL, LDLCALC, TRIG, CHOLHDL, LDLDIRECT in the last 72 hours. Thyroid Function Tests: No results for input(s): TSH, T4TOTAL, FREET4, T3FREE, THYROIDAB in the last 72 hours. Anemia Panel: No results for input(s): VITAMINB12, FOLATE, FERRITIN, TIBC, IRON, RETICCTPCT in the last 72 hours. Urine analysis:    Component Value Date/Time   COLORURINE YELLOW 05/19/2019 1355   APPEARANCEUR CLEAR 05/19/2019 1355   APPEARANCEUR Clear 07/23/2017   LABSPEC 1.013 05/19/2019 1355   PHURINE 5.0 05/19/2019 1355   GLUCOSEU NEGATIVE 05/19/2019 1355   GLUCOSEU 500 (A) 07/14/2017 1053   HGBUR SMALL (A) 05/19/2019 1355   BILIRUBINUR NEGATIVE 05/19/2019 1355   BILIRUBINUR Negative 07/23/2017   KETONESUR NEGATIVE 05/19/2019 1355   PROTEINUR NEGATIVE 05/19/2019 1355   UROBILINOGEN 0.2 07/14/2017 1053   NITRITE NEGATIVE 05/19/2019 1355   LEUKOCYTESUR NEGATIVE 05/19/2019 1355   Sepsis Labs:  @[email or web address] (procalcitonin:4,lacticidven:4)  ) Recent Results (from the past 240 hour(s))  Aerobic/Anaerobic Culture (surgical/deep wound)     Status: None (Preliminary result)   Collection Time: 06/14/19 11:54 AM   Specimen: Back  Result Value Ref Range Status   Specimen Description BACK  Final   Special Requests NONE  Final   Gram Stain NO WBC SEEN NO ORGANISMS SEEN   Final   Culture   Final    CULTURE REINCUBATED FOR BETTER GROWTH Performed at [facility], [address], [place], [place] [address]    Report Status PENDING  Incomplete      Radiology Studies: Dg Abdomen Peg Tube Location  Result Date: 06/14/2019 CLINICAL DATA:  PEG tube malfunction EXAM: ABDOMEN - 1 VIEW COMPARISON:  None. FINDINGS: There is oral contrast material hand injected through the gastrostomy tube opacifying the stomach. No extraluminal contrast is observed. There is no bowel dilatation to suggest obstruction. There is no evidence of pneumoperitoneum, portal venous gas or pneumatosis. There are no pathologic calcifications along the expected course of the ureters. There is posterior lumbar fusion hardware present. There is a right hip arthroplasty. IMPRESSION: Percutaneous gastrostomy tube in satisfactory position. Electronically Signed   By: Elige Ko   On: 06/14/2019 17:15     Scheduled Meds: . sodium chloride   Intravenous Once  . ascorbic acid  500 mg Per Tube Daily  . carvedilol  3.125 mg Per Tube BID WC  . chlorhexidine  15 mL Mouth Rinse BID  . Chlorhexidine Gluconate Cloth  6 each Topical Daily  . feeding supplement (OSMOLITE 1.5 CAL)  474 mL Per Tube TID  . feeding supplement (PRO-STAT SUGAR FREE 64)  30 mL Per Tube BID  . fluticasone  2 spray Each Nare Daily  . fluticasone furoate-vilanterol  1 puff Inhalation Daily  . free water  200 mL Per Tube Q4H  . insulin aspart  0-20 Units Subcutaneous Q4H  . insulin aspart  4 Units Subcutaneous Q4H  . insulin glargine  20 Units  Subcutaneous Daily  . liver oil-zinc oxide   Topical QID  . loratadine  10 mg Per Tube QPM  . mouth rinse  15 mL Mouth Rinse q12n4p  . multivitamin with minerals  1 tablet Per Tube Daily  . pantoprazole sodium  40 mg Per Tube Daily  . simvastatin  20 mg Per Tube q morning - 10a  . sodium chloride flush  10-40 mL Intracatheter Q12H  . sodium chloride flush  3 mL Intravenous Q12H  . sulfamethoxazole-trimethoprim  1 tablet Per Tube Q12H  . tamsulosin  0.4 mg Oral QPC breakfast  . torsemide  20 mg Per Tube q1800  . umeclidinium bromide  1 puff Inhalation Daily  . Vilazodone HCl  40 mg Oral Daily   Continuous Infusions: . sodium chloride 250 mL (06/03/19 2141)     LOS: 41 days   Time Spent in minutes   45 minutes  Janiah Devinney D.O. on 06/16/2019 at 12:44 PM  Between 7am to 7pm - Please see pager noted on [email or web address]  After 7pm go to [email or web address]  And look for the night coverage person covering for me after hours  [facility] Office  [phone]

## 2019-06-16 NOTE — Progress Notes (Signed)
Occupational Therapy Treatment Patient Details Name: Cody Hale MRN: [id number] DOB: 23-Apr-1951 Today's Date: 06/16/2019    History of present illness Pt is a 68 yo male presented 05/06/19 to ED after fall  (he tripped over some blankets), onto his buttocks with severe back pain;  CT lumbar spine showing unstable fracture L1-2 and likely ankylosing spondylitis; tested +COVID and developed fever and required TEE to r/o endocarditis prior to surgery;  11/17 underwent open reduction internal fixation of L1-L2 Chance fracture with multilevel spine stabilization from T11-L4. Remained intubated until 07/25/18 PMHx: Afib, MVR, SVT, anxiety, CAD, COPD, CHF, HTN,    OT comments  Pt progressing to sitting EOB <1 minute and refusing TLSO. Pt limited by increased pain and spasms in back, decreased ability to care for self and increased assist required for overall mobility. Pt totalA +2 for sidelying to sit, pt unable to assist with trunk support and BLE required assist; pt modA for rolling and maxA+2 for sitting to supine. Pt set-upA for light grooming for pt in sidelying. Pt would greatly benefit from continued OT skilled services for ADL, mobility and safety in SNF setting.  OT following.    Follow Up Recommendations  SNF;Supervision/Assistance - 24 hour    Equipment Recommendations  3 in 1 bedside commode;Wheelchair (measurements OT);Wheelchair cushion (measurements OT);Hospital bed    Recommendations for Other Services      Precautions / Restrictions Precautions Precautions: Back;Fall Precaution Booklet Issued: No Precaution Comments: requiring cues for back precautions Spinal Brace Comments: Pt sitting EOB <1 minute and refusing TLSO Restrictions Weight Bearing Restrictions: No       Mobility Bed Mobility Overal bed mobility: Needs Assistance Bed Mobility: Rolling;Supine to Sit Rolling: Mod assist;+2 for physical assistance;+2 for safety/equipment Sidelying to sit: Total assist;+2 for  physical assistance     Sit to sidelying: Mod assist;+2 for physical assistance;+2 for safety/equipment General bed mobility comments: Pt assistance with rolling to side and cues for sitting upright max encouragement at EOB.  Transfers                 General transfer comment: deferred as pt in too severe of pain and denying need to put brace on even after education    Balance Overall balance assessment: Needs assistance Sitting-balance support: Bilateral upper extremity supported;Feet supported Sitting balance-Leahy Scale: Poor Sitting balance - Comments: pt with strong retropulsion, with max verbal and tactile cues pt able to pull self forward to midline however unable to maintain >5 sec Postural control: Posterior lean                                 ADL either performed or assessed with clinical judgement   ADL Overall ADL's : Needs assistance/impaired Eating/Feeding: Set up;Bed level(ice chip only)   Grooming: Set up;Bed level                               Functional mobility during ADLs: Maximal assistance;Total assistance;+2 for physical assistance;+2 for safety/equipment General ADL Comments: Pt limited by increased pain and spasms in back, decreased ability to care for self and increased assist required for overall mobility.     Vision   Vision Assessment?: No apparent visual deficits   Perception     Praxis      Cognition Arousal/Alertness: Awake/alert Behavior During Therapy: Anxious Overall Cognitive Status: Impaired/Different from baseline Area of Impairment:  Memory;Safety/judgement;Problem solving;Awareness                   Current Attention Level: Sustained Memory: Decreased recall of precautions;Decreased short-term memory Following Commands: Follows one step commands consistently Safety/Judgement: Decreased awareness of safety;Decreased awareness of deficits Awareness: Intellectual Problem Solving: Slow  processing;Decreased initiation;Requires verbal cues;Requires tactile cues General Comments: Pt following all commands stoday        Exercises     Shoulder Instructions       General Comments VSS.    Pertinent Vitals/ Pain       Pain Assessment: Faces Faces Pain Scale: Hurts whole lot Pain Location: low back Pain Descriptors / Indicators: Spasm;Sore Pain Intervention(s): Monitored during session  Home Living                                          Prior Functioning/Environment              Frequency  Min 2X/week        Progress Toward Goals  OT Goals(current goals can now be found in the care plan section)  Progress towards OT goals: Progressing toward goals  Acute Rehab OT Goals Patient Stated Goal: none stated OT Goal Formulation: With patient Time For Goal Achievement: 06/25/19 Potential to Achieve Goals: Good ADL Goals Pt Will Perform Grooming: with min assist;sitting Pt Will Perform Upper Body Dressing: with min assist;sitting Pt Will Transfer to Toilet: stand pivot transfer;bedside commode;with mod assist Pt/caregiver will Perform Home Exercise Program: Both right and left upper extremity;Increased strength;With minimal assist;With written HEP provided Additional ADL Goal #1: Pt will follow 100% commands in 4/6 trials with 90% accuracy.  Plan Discharge plan remains appropriate    Co-evaluation    PT/OT/SLP Co-Evaluation/Treatment: Yes Reason for Co-Treatment: Complexity of the patient's impairments (multi-system involvement);To address functional/ADL transfers   OT goals addressed during session: ADL's and self-care      AM-PAC OT "6 Clicks" Daily Activity     Outcome Measure   Help from another person eating meals?: Total Help from another person taking care of personal grooming?: A Lot Help from another person toileting, which includes using toliet, bedpan, or urinal?: Total Help from another person bathing (including  washing, rinsing, drying)?: A Lot Help from another person to put on and taking off regular upper body clothing?: A Lot Help from another person to put on and taking off regular lower body clothing?: Total 6 Click Score: 9    End of Session Equipment Utilized During Treatment: Oxygen  OT Visit Diagnosis: Unsteadiness on feet (R26.81);Muscle weakness (generalized) (M62.81);Pain Pain - part of body: (back)   Activity Tolerance Patient limited by pain;Patient limited by fatigue   Patient Left in bed;with call bell/phone within reach;with bed alarm set   Nurse Communication Mobility status;Precautions        Time: 3810-1751 OT Time Calculation (min): 29 min  Charges: OT General Charges $OT Visit: 1 Visit OT Treatments $Self Care/Home Management : 8-22 mins  Darryl Nestle) Marsa Aris OTR/L Acute Rehabilitation Services Pager: [phone] Office: [phone]    Jenene Slicker Yvonnie Schinke 06/16/2019, 4:20 PM

## 2019-06-17 ENCOUNTER — Inpatient Hospital Stay (HOSPITAL_COMMUNITY): Payer: Medicare Other

## 2019-06-17 LAB — GLUCOSE, CAPILLARY
Glucose-Capillary: 105 mg/dL — ABNORMAL HIGH (ref 70–99)
Glucose-Capillary: 175 mg/dL — ABNORMAL HIGH (ref 70–99)
Glucose-Capillary: 194 mg/dL — ABNORMAL HIGH (ref 70–99)
Glucose-Capillary: 218 mg/dL — ABNORMAL HIGH (ref 70–99)
Glucose-Capillary: 235 mg/dL — ABNORMAL HIGH (ref 70–99)
Glucose-Capillary: 257 mg/dL — ABNORMAL HIGH (ref 70–99)

## 2019-06-17 LAB — BASIC METABOLIC PANEL
Anion gap: 7 (ref 5–15)
BUN: 25 mg/dL — ABNORMAL HIGH (ref 8–23)
CO2: 34 mmol/L — ABNORMAL HIGH (ref 22–32)
Calcium: 8 mg/dL — ABNORMAL LOW (ref 8.9–10.3)
Chloride: 95 mmol/L — ABNORMAL LOW (ref 98–111)
Creatinine, Ser: 0.68 mg/dL (ref 0.61–1.24)
GFR calc Af Amer: >60 mL/min (ref >60)
GFR calc non Af Amer: >60 mL/min (ref >60)
Glucose, Bld: 296 mg/dL — ABNORMAL HIGH (ref 70–99)
Potassium: 4.5 mmol/L (ref 3.5–5.1)
Sodium: 136 mmol/L (ref 135–145)

## 2019-06-17 LAB — CBC
HCT: 30.3 % — ABNORMAL LOW (ref 39.0–52.0)
Hemoglobin: 8.8 g/dL — ABNORMAL LOW (ref 13.0–17.0)
MCH: 27.4 pg (ref 26.0–34.0)
MCHC: 29 g/dL — ABNORMAL LOW (ref 30.0–36.0)
MCV: 94.4 fL (ref 80.0–100.0)
Platelets: 243 10*3/uL (ref 150–400)
RBC: 3.21 MIL/uL — ABNORMAL LOW (ref 4.22–5.81)
RDW: 17 % — ABNORMAL HIGH (ref 11.5–15.5)
WBC: 5.7 10*3/uL (ref 4.0–10.5)
nRBC: 0 % (ref 0.0–0.2)

## 2019-06-17 MED ORDER — RIVAROXABAN 20 MG PO TABS: 20.0000 mg | ORAL_TABLET | Freq: Every day | ORAL | Status: DC

## 2019-06-17 MED ORDER — RIVAROXABAN 20 MG PO TABS
20.0000 mg | ORAL_TABLET | Freq: Every day | ORAL | Status: DC
  Administered 2019-06-17: 20 mg
  Filled 2019-06-17: qty 1

## 2019-06-17 NOTE — Progress Notes (Signed)
PT Cancellation Note  Patient Details Name: Cody Hale MRN: [id number] DOB: [birth year]   Cancelled Treatment:    Reason Eval/Treat Not Completed: Patient declined, no reason specified;Pain limiting ability to participate - Pt reports being in too much pain to mobilize, declined x3 with max verbal encouragement. PT will check back as schedule allows.  [place] Pager [phone]  Office [phone]    Roxine Caddy D Elonda Husky 06/17/2019, 5:01 PM

## 2019-06-17 NOTE — Progress Notes (Addendum)
PROGRESS NOTE    Cody Hale  EXH:[id number] DOB: 1950-11-17 DOA: 05/06/2019 PCP: [name], MD   Brief Narrative:  68 year old male with history of combined systolic and diastolic congestive heart failure, bioprosthetic mitral valve, endocarditis 2014, atrial fibrillation, A. fib, COPD, diabetes, who presented from home after a fall at home his MRI in the ER showed lumbar fracture and transferred to [facility] was positive for Covid neurosurgery was consulted his hospital course was complicated by gram-positive strep bacteremia and completed antibiotics he underwent spinal fusion with placement of a drain by neurosurgery.  After his spinal fusion it was difficult to ventilate him fusion  so he required to be intubated.  His  hospital course was further complicated by incidental finding of retroperitoneal hematoma so his heparin drip was stopped, IR placed a PEG tube on June 04, 2019  Assessment & Plan   T11/L4 fusion after lumbar fracture 05/25/19 with Wound dehiscence noted 06/14/19 -Neurosurgery continues to follow, appreciate insight and recommendations  -Seen by PT and OT with recommendation for SNF - defer to CSW/CM -Continue bactrim DS BID stop date 06/20/2019 -Staple removal concerning for purulent material below - cultures show no growth to date -Wound care following, staples to be removed today. Wound appears less friable. -Added Robaxin to help with pain control  Dysphagia requiring PEG tube placement 06/04/2019 -Continue aspiration precaution.  -Questionable PEG tube dysfunction previously, PEG tube appears to be in adequate position per IR imaging, continue tube feeds free water flushes and meds -follow for issues -Given ongoing leakage around tube insertion site,  Swab/culture pending -PEG tube feeding per nutrition team -noted some erythema and drainage around PEG site- of note patient is on bactrim -IR reconsulted-given that gastrostomy tube is newly placed,  recommended treating through possible infection on antibiotics.  It is too early to remove or exchange the gastrostomy tube, minimum of 6 to 8 weeks after placement as needed for removal or exchange. -Patient currently on Bactrim  Retroperitoneal hematoma affecting gluteus musculature bilaterally -Incidental finding on the scan which was done prior to PEG tube placement.  -Currently off anticoagulation. No surgical intervention indicated. -Hemoglobin currently stable 8.8 -No sign of overt bleeding. -Will restart anticoagulation today and monitor closely  Healthcare acquired pneumonia, Corynebacterium -Completed treatment with cefepime/vancomycin -Clinically improved  Acute hypoxic respiratory failure, likely in the setting of above, POA ongoing -Likely silent aspiration -Continue order of care, bronchodilators as needed -Maintain oxygen saturations to greater than 90% -Patient will likely need ambulatory oxygen screen prior to discharge -Patient with productive cough this morning, will obtain chest x-ray  COVID-19 pneumonia, resolved -Supportive care. Previously treated with steroids, remdesivir, bronchodilators  Chronic atrial fibrillation -Anticoagulation was held due to retroperitoneal bleed, however will restart Xarelto today and monitor closely. -On Coreg 3.125 mg twice daily for rate control.  Chronic systolic congestive heart failure -Appears to be stable and euvolemic -Continue Coreg, torsemide -Echocardiogram 05/20/2019 showed an EF of 40 to 45% -Losartan held due to soft BP  Diabetes mellitus type 2, hyperglycemia -Holding outpatient medication.  -Continue Lantus, insulin sliding scale and CBG monitoring.   GERD -Continue PPI  Hyperlipidemia -Continue statin  DVT Prophylaxis  SCDs --> Xarelto  Code Status: Full  Family Communication: None at bedside  Disposition Plan: Admitted. Pending SNF placement, likely on 12/11. Will restart anticoagulation  today and monitor hemoglobin  Consultants Neurosurgery Interventional radiology Cardiology PCCM  Procedures  PEG tube placement by interventional radiology 11/27  Antibiotics   Anti-infectives (From admission,  onward)   Start     Dose/Rate Route Frequency Ordered Stop   06/11/19 2315  sulfamethoxazole-trimethoprim (BACTRIM DS) 800-160 MG per tablet 1 tablet     1 tablet Per Tube Every 12 hours 06/11/19 2306 06/20/19 2159   06/11/19 1315  sulfamethoxazole-trimethoprim (BACTRIM DS) 800-160 MG per tablet 1 tablet  Status:  Discontinued     1 tablet Oral Every 12 hours 06/11/19 1310 06/11/19 2306   06/06/19 2200  vancomycin (VANCOCIN) 1,500 mg in sodium chloride 0.9 % 500 mL IVPB     1,500 mg 250 mL/hr over 120 Minutes Intravenous Every 24 hours 06/06/19 1103 06/09/19 2312   06/04/19 0000  ceFAZolin (ANCEF) IVPB 2g/100 mL premix     2 g 200 mL/hr over 30 Minutes Intravenous To Radiology 06/02/19 1327 06/04/19 0704   06/03/19 2200  ceFEPIme (MAXIPIME) 2 g in sodium chloride 0.9 % 100 mL IVPB  Status:  Discontinued     2 g 200 mL/hr over 30 Minutes Intravenous Every 8 hours 06/03/19 2055 06/08/19 1435   06/03/19 2200  vancomycin (VANCOCIN) 2,000 mg in sodium chloride 0.9 % 500 mL IVPB  Status:  Discontinued     2,000 mg 250 mL/hr over 120 Minutes Intravenous Every 24 hours 06/03/19 2104 06/06/19 1103   05/25/19 1415  ceFAZolin (ANCEF) IVPB 2g/100 mL premix     2 g 200 mL/hr over 30 Minutes Intravenous Every 8 hours 05/25/19 1405 05/25/19 2209   05/25/19 0943  bacitracin 50,000 Units in sodium chloride 0.9 % 500 mL irrigation  Status:  Discontinued       As needed 05/25/19 0944 05/25/19 1324   05/15/19 1430  penicillin G potassium 12 Million Units in dextrose 5 % 500 mL continuous infusion     12 Million Units 41.7 mL/hr over 12 Hours Intravenous Every 12 hours 05/15/19 1343 05/25/19 0001   05/13/19 1000  penicillin G potassium 12 Million Units in dextrose 5 % 500 mL continuous  infusion  Status:  Discontinued     12 Million Units 41.7 mL/hr over 12 Hours Intravenous Every 12 hours 05/12/19 1159 05/15/19 1343   05/11/19 0730  cefTRIAXone (ROCEPHIN) 2 g in sodium chloride 0.9 % 100 mL IVPB  Status:  Discontinued     2 g 200 mL/hr over 30 Minutes Intravenous Every 24 hours 05/11/19 0721 05/12/19 1159   05/08/19 0200  remdesivir 100 mg in sodium chloride 0.9 % 250 mL IVPB     100 mg 500 mL/hr over 30 Minutes Intravenous Every 24 hours 05/07/19 0059 05/11/19 0513   05/07/19 0100  remdesivir 200 mg in sodium chloride 0.9 % 250 mL IVPB     200 mg 500 mL/hr over 30 Minutes Intravenous Once 05/07/19 0057 05/07/19 2694      Subjective:   Jonta Peffley seen and examined today.  Patient complaining of pain all over the.  Has not had any pain medications today.  Denies current chest pain, shortness of breath, abdominal pain, nausea or vomiting, dizziness or headache.  Does state he has a cough that is been productive.  Objective:   Vitals:   06/17/19 0500 06/17/19 0800 06/17/19 0937 06/17/19 1046  BP:  127/60  (!) 131/57  Pulse:  78 81 81  Resp:  20 20 (!) 23  Temp:  (!) 97.5 F (36.4 C)    TempSrc:  Oral    SpO2:  98% 98% 98%  Weight: 115.2 kg     Height:  Intake/Output Summary (Last 24 hours) at 06/17/2019 1127 Last data filed at 06/17/2019 0500 Gross per 24 hour  Intake 183 ml  Output 2350 ml  Net -2167 ml   Filed Weights   06/15/19 0500 06/16/19 0500 06/17/19 0500  Weight: 118.3 kg 115.2 kg 115.2 kg   Exam  General: Well developed, chronically ill appearing, NAD  HEENT: NCAT, mucous membranes moist.   Cardiovascular: S1 S2 auscultated, irregular  Respiratory: Diminished breath sounds, productive cough  Abdomen: Soft, nontender, nondistended, + bowel sounds, Peg with dressing in place- when removed, slight drainage and improving erythema  Extremities: warm dry without cyanosis clubbing or edema  Neuro: AAOx3, nonfocal  Psych:  Appropriate mood and affect  Data Reviewed: I have personally reviewed following labs and imaging studies  CBC: Recent Labs  Lab 06/13/19 0600 06/14/19 0617 06/15/19 0414 06/16/19 0442 06/17/19 0540  WBC 10.9* 11.8* 8.8 6.1 5.7  HGB 9.0* 8.9* 8.8* 8.9* 8.8*  HCT 30.0* 30.3* 29.3* 30.9* 30.3*  MCV 93.2 94.1 92.4 94.5 94.4  PLT 230 218 217 226 243   Basic Metabolic Panel: Recent Labs  Lab 06/11/19 0622 06/12/19 0510 06/13/19 0600 06/14/19 0617 06/15/19 0414 06/16/19 0442 06/17/19 0615  NA 138 140 137 137 136 140 136  K 4.5 4.4 4.5 5.2* 4.6 4.7 4.5  CL 99 98 96* 96* 94* 96* 95*  CO2 30 34* 35* 34* 34* 35* 34*  GLUCOSE 264* 205* 173* 220* 159* 271* 296*  BUN 35* 30* 29* 30* 29* 32* 25*  CREATININE 0.78 0.59* 0.69 0.74 0.72 0.78 0.68  CALCIUM 8.2* 8.2* 8.0* 8.1* 8.1* 8.3* 8.0*  MG 2.2 2.2 2.3 2.1  --   --   --    GFR: Estimated Creatinine Clearance: 110.6 mL/min (by C-G formula based on SCr of 0.68 mg/dL). Liver Function Tests: No results for input(s): AST, ALT, ALKPHOS, BILITOT, PROT, ALBUMIN in the last 168 hours. No results for input(s): LIPASE, AMYLASE in the last 168 hours. No results for input(s): AMMONIA in the last 168 hours. Coagulation Profile: No results for input(s): INR, PROTIME in the last 168 hours. Cardiac Enzymes: No results for input(s): CKTOTAL, CKMB, CKMBINDEX, TROPONINI in the last 168 hours. BNP (last 3 results) No results for input(s): PROBNP in the last 8760 hours. HbA1C: No results for input(s): HGBA1C in the last 72 hours. CBG: Recent Labs  Lab 06/16/19 1704 06/16/19 2004 06/17/19 0026 06/17/19 0417 06/17/19 0841  GLUCAP 187* 146* 235* 257* 218*   Lipid Profile: No results for input(s): CHOL, HDL, LDLCALC, TRIG, CHOLHDL, LDLDIRECT in the last 72 hours. Thyroid Function Tests: No results for input(s): TSH, T4TOTAL, FREET4, T3FREE, THYROIDAB in the last 72 hours. Anemia Panel: No results for input(s): VITAMINB12, FOLATE, FERRITIN,  TIBC, IRON, RETICCTPCT in the last 72 hours. Urine analysis:    Component Value Date/Time   COLORURINE YELLOW 05/19/2019 1355   APPEARANCEUR CLEAR 05/19/2019 1355   APPEARANCEUR Clear 07/23/2017 0000   LABSPEC 1.013 05/19/2019 1355   PHURINE 5.0 05/19/2019 1355   GLUCOSEU NEGATIVE 05/19/2019 1355   GLUCOSEU 500 (A) 07/14/2017 1053   HGBUR SMALL (A) 05/19/2019 1355   BILIRUBINUR NEGATIVE 05/19/2019 1355   BILIRUBINUR Negative 07/23/2017 0000   KETONESUR NEGATIVE 05/19/2019 1355   PROTEINUR NEGATIVE 05/19/2019 1355   UROBILINOGEN 0.2 07/14/2017 1053   NITRITE NEGATIVE 05/19/2019 1355   LEUKOCYTESUR NEGATIVE 05/19/2019 1355   Sepsis Labs: @LABRCNTIP (procalcitonin:4,lacticidven:4)  ) Recent Results (from the past 240 hour(s))  Aerobic/Anaerobic Culture (surgical/deep wound)  Status: None (Preliminary result)   Collection Time: 06/14/19 11:54 AM   Specimen: Back  Result Value Ref Range Status   Specimen Description BACK  Final   Special Requests NONE  Final   Gram Stain NO WBC SEEN NO ORGANISMS SEEN   Final   Culture   Final    CULTURE REINCUBATED FOR BETTER GROWTH Performed at [facility], [address], [place], [place] [address]    Report Status PENDING  Incomplete      Radiology Studies: No results found.   Scheduled Meds: . sodium chloride   Intravenous Once  . ascorbic acid  500 mg Per Tube Daily  . carvedilol  3.125 mg Per Tube BID WC  . chlorhexidine  15 mL Mouth Rinse BID  . Chlorhexidine Gluconate Cloth  6 each Topical Daily  . feeding supplement (OSMOLITE 1.5 CAL)  474 mL Per Tube TID  . feeding supplement (PRO-STAT SUGAR FREE 64)  30 mL Per Tube BID  . fluticasone  2 spray Each Nare Daily  . fluticasone furoate-vilanterol  1 puff Inhalation Daily  . free water  200 mL Per Tube Q4H  . insulin aspart  0-20 Units Subcutaneous Q4H  . insulin aspart  4 Units Subcutaneous Q4H  . insulin glargine  20 Units Subcutaneous Daily  . liver oil-zinc  oxide   Topical QID  . loratadine  10 mg Per Tube QPM  . mouth rinse  15 mL Mouth Rinse q12n4p  . multivitamin with minerals  1 tablet Per Tube Daily  . pantoprazole sodium  40 mg Per Tube Daily  . rivaroxaban  20 mg Per Tube Q supper  . simvastatin  20 mg Per Tube q morning - 10a  . sodium chloride flush  10-40 mL Intracatheter Q12H  . sodium chloride flush  3 mL Intravenous Q12H  . sulfamethoxazole-trimethoprim  1 tablet Per Tube Q12H  . tamsulosin  0.4 mg Oral QPC breakfast  . torsemide  20 mg Per Tube q1800  . umeclidinium bromide  1 puff Inhalation Daily  . Vilazodone HCl  40 mg Oral Daily   Continuous Infusions: . sodium chloride 250 mL (06/03/19 2141)  . methocarbamol (ROBAXIN) IV 500 mg (06/16/19 2237)     LOS: 42 days   Time Spent in minutes   45 minutes  Ibraham Levi D.O. on 06/17/2019 at 11:27 AM  Between 7am to 7pm - Please see pager noted on [email or web address]  After 7pm go to [email or web address]  And look for the night coverage person covering for me after hours  [facility] Office  [phone]

## 2019-06-17 NOTE — Discharge Summary (Signed)
Physician Discharge Summary  [name] XJO:[id number] DOB: [birth year] DOA: 05/06/2019  PCP: [name], MD  Admit date: 05/06/2019 Discharge date: 06/18/2019  Time spent: 45 minutes  Recommendations for Outpatient Follow-up:  Patient will be discharged to skilled nursing facility, continue physical, occupational, and speech therapy.  Patient will need to follow up with primary care provider within one week of discharge, repeat CBC.  Follow up with neurosurgery. Patient should continue medications as prescribed.  Continue tube feeds.  Discharge Diagnoses:  T11/L4 fusion after lumbar fracture 05/25/19 with Wound dehiscence noted12/7/20 Dysphagia requiring PEG tube placement 06/04/2019 Retroperitoneal hematoma affecting gluteus musculature bilaterally Healthcare acquired pneumonia, Corynebacterium Acute hypoxic respiratory failure, likely in the setting of above, POA ongoing COVID-19 pneumonia, resolved Chronic atrial fibrillation Chronic systolic congestive heart failure Diabetes mellitus type 2, hyperglycemia GERD Hyperlipidemia  Discharge Condition: stable  Diet recommendation: Tube feeds  Filed Weights   06/16/19 0500 06/17/19 0500 06/18/19 0500  Weight: 115.2 kg 115.2 kg 115.2 kg    History of present illness:  On 05/07/2019 by Dr. Gean Birchwood Cody Hale is a 68 y.o. 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  Patient fell onto his buttock.  Started hurting and was brought to the ER.  Denies any incontinence of urine or bowel.  Patient's pain has been significant.  Hospital Course:  T11/L4 fusion after lumbar fracture 05/25/19 with Wound dehiscence noted12/7/20 -Neurosurgery continues to follow, appreciate insight and recommendations -Seen by PT and OT with  recommendation for SNF- defer to CSW/CM -Continue bactrim DS BIDstop date 06/20/2019 -Staple removal concerning for purulent material below - cultures show no growth to date -Wound care following, staples removed. Cleansed with betadine and Xeroform gauze applied.  Covered with silicone foam dressing. Change M/W/F  Dysphagia requiring PEG tube placement 06/04/2019 -Continue aspiration precaution.  -Questionable PEG tube dysfunctionpreviously, PEG tube appears to be in adequate position per IR imaging, continue tube feeds free water flushes and meds -follow for issues -Given ongoing leakage around tube insertion site,  Swab/culture pending -PEG tube feeding per nutrition team -noted some erythema and drainage around PEG site- of note patient is on bactrim -IR reconsulted-given that gastrostomy tube is newly placed, recommended treating through possible infection on antibiotics.  It is too early to remove or exchange the gastrostomy tube, minimum of 6 to 8 weeks after placement as needed for removal or exchange. -Continue Bactrim   Retroperitoneal hematoma affecting gluteus musculature bilaterally -Incidental finding on the scan which was done prior to PEG tube placement.  -Currently off anticoagulation. No surgical intervention indicated. -Hemoglobin currently stable 8.9 -No sign of overt bleeding. -Xarelto restarted on 12/10, hemoglobin stable  Healthcare acquired pneumonia, Corynebacterium -Completed treatment with cefepime/vancomycin -Clinically improved  Acute hypoxic respiratory failure, likely in the setting of above, POA ongoing -Likely silent aspiration -Continue order of care, bronchodilators as needed -Maintain oxygen saturations to greater than 90% -Will need supplemental oxygen upon discharge   COVID-19 pneumonia, resolved -Supportive care. Previously treated with steroids, remdesivir, bronchodilators  Chronic atrial fibrillation -No anticoagulation due to  retroperitoneal bleed.  -On Coreg 3.125 mg twice daily for rate control.  Chronic systolic congestive heart failure -Appears to be stable and euvolemic -Continue Coreg, torsemide -Echocardiogram 05/20/2019 showed an EF of 40 to 45% -Losartan can be restarted on discharge as BP stable  Diabetes mellitus type  2, hyperglycemia -Continue sliding scale, trulicity, Tresiba   GERD -Continue PPI  Hyperlipidemia -Continue statin  Consultants Neurosurgery Interventional radiology Cardiology PCCM  Procedures  PEG tube placement by interventional radiology11/27  Discharge Exam: Vitals:   06/18/19 0827 06/18/19 0840  BP:  128/76  Pulse: 83 86  Resp: 15 16  Temp:  98.7 F (37.1 C)  SpO2: 98% 97%     General: Well developed, chronically ill appearing, NAD  HEENT: NCAT, mucous membranes moist.  Cardiovascular: S1 S2 auscultated, irregular  Respiratory: Diminished breath sounds  Abdomen: Soft, nontender, nondistended, + bowel sounds, +peg with dressing in place  Extremities: warm dry without cyanosis clubbing or edema  Neuro: AAOx3, nonfocal  Psych: Normal affect and demeanor   Discharge Instructions Discharge Instructions    Discharge instructions   Complete by: As directed    Patient will be discharged to skilled nursing facility, continue physical, occupational, and speech therapy.  Patient will need to follow up with primary care provider within one week of discharge, repeat CBC.  Follow up with neurosurgery. Patient should continue medications as prescribed.  Continue tube feeds.     Allergies as of 06/18/2019      Reactions   Lisinopril Cough   Daptomycin Rash   Metformin And Related Diarrhea   Tape Rash, Other (See Comments)   Adhesive Tape-Burn skin.      Medication List    STOP taking these medications   acetaminophen 500 MG tablet Commonly known as: TYLENOL Replaced by: acetaminophen 160 MG/5ML solution   Farxiga 10 MG Tabs tablet Generic  drug: dapagliflozin propanediol   insulin aspart 100 UNIT/ML FlexPen Commonly known as: NOVOLOG Replaced by: insulin aspart 100 UNIT/ML injection   metoCLOPramide 5 MG tablet Commonly known as: REGLAN   pantoprazole 40 MG tablet Commonly known as: Protonix Replaced by: pantoprazole sodium 40 mg/20 mL Pack   potassium chloride SA 20 MEQ tablet Commonly known as: KLOR-CON     TAKE these medications   accu-chek soft touch lancets Use to check blood sugar 3x per day. DX: E11.8 Accu Check   acetaminophen 160 MG/5ML solution Commonly known as: TYLENOL Place 20.3 mLs (650 mg total) into feeding tube every 6 (six) hours as needed (mild pain or fever </= 101). Replaces: acetaminophen 500 MG tablet   ascorbic acid 500 MG/5ML syrup Commonly known as: VITAMIN C Place 5 mLs (500 mg total) into feeding tube daily.   B-D SYRINGE/NEEDLE 3CC/25GX5/8 25G X 5/8" 3 ML Misc Generic drug: SYRINGE-NEEDLE (DISP) 3 ML Use to inject b12 every month   blood glucose meter kit and supplies Kit Use to check blood sugar 3x per day. DX: E11.8 Accu Check   carvedilol 3.125 MG tablet Commonly known as: COREG Place 1 tablet (3.125 mg total) into feeding tube 2 (two) times daily with a meal. What changed:   how to take this  when to take this   Cool Blood Glucose Test Strips test strip Generic drug: glucose blood Use to check blood sugar 3x per day. DX: E11.8 Accu Check   feeding supplement (OSMOLITE 1.5 CAL) Liqd Place 474 mLs into feeding tube 3 (three) times daily.   feeding supplement (PRO-STAT SUGAR FREE 64) Liqd Place 30 mLs into feeding tube 2 (two) times daily.   fluticasone 50 MCG/ACT nasal spray Commonly known as: FLONASE Place 2 sprays into both nostrils daily.   free water Soln Place 200 mLs into feeding tube every 4 (four) hours.   insulin aspart 100 UNIT/ML injection  Commonly known as: novoLOG CBG 121 - 150: 3 units CBG 151 - 200: 4 units CBG 201 - 250: 7 units CBG 251 -  300: 11 units CBG 301 - 350: 15 units CBG 351 - 400: 20 units Replaces: insulin aspart 100 UNIT/ML FlexPen   levocetirizine 5 MG tablet Commonly known as: XYZAL Place 1 tablet (5 mg total) into feeding tube every evening. What changed: how to take this   liver oil-zinc oxide 40 % ointment Commonly known as: DESITIN Apply topically 4 (four) times daily.   losartan 25 MG tablet Commonly known as: COZAAR Place 0.5 tablets (12.5 mg total) into feeding tube daily. What changed: how to take this   Magnesium Oxide 400 (240 Mg) MG Tabs Give 1 tablet (400 mg total) by tube 2 (two) times daily. What changed: how to take this   methocarbamol 500 MG tablet Commonly known as: Robaxin Take 1 tablet (500 mg total) by mouth every 8 (eight) hours as needed for muscle spasms.   multivitamin with minerals Tabs tablet Place 1 tablet into feeding tube daily. What changed: how to take this   nitroGLYCERIN 0.4 MG SL tablet Commonly known as: NITROSTAT Place 1 tablet (0.4 mg total) under the tongue every 5 (five) minutes as needed for chest pain. Max 3 doses.   NovoFine Autocover 30G X 8 MM Misc Generic drug: Insulin Pen Needle USE TID.   oxyCODONE 5 MG immediate release tablet Commonly known as: Oxy IR/ROXICODONE Place 1 tablet (5 mg total) into feeding tube every 4 (four) hours as needed for moderate pain.   pantoprazole sodium 40 mg/20 mL Pack Commonly known as: PROTONIX Place 20 mLs (40 mg total) into feeding tube daily. Replaces: pantoprazole 40 MG tablet   polyethylene glycol 17 g packet Commonly known as: MIRALAX / GLYCOLAX Place 17 g into feeding tube daily as needed for moderate constipation. What changed:   how to take this  when to take this  reasons to take this   rivaroxaban 20 MG Tabs tablet Commonly known as: Xarelto Take one tablet daily via tube. What changed:   how much to take  how to take this  when to take this  additional instructions     simvastatin 20 MG tablet Commonly known as: ZOCOR Place 1 tablet (20 mg total) into feeding tube every morning. What changed: how to take this   sulfamethoxazole-trimethoprim 800-160 MG tablet Commonly known as: BACTRIM DS Place 1 tablet into feeding tube every 12 (twelve) hours for 3 days.   tamsulosin 0.4 MG Caps capsule Commonly known as: FLOMAX Take 1 capsule (0.4 mg total) by mouth daily after breakfast.   torsemide 20 MG tablet Commonly known as: DEMADEX Place 1 tablet (20 mg total) into feeding tube daily at 6 PM. What changed:   how much to take  how to take this  when to take this   Trelegy Ellipta 100-62.5-25 MCG/INH Aepb Generic drug: Fluticasone-Umeclidin-Vilant Inhale 1 puff into the lungs daily.   Tyler Aas FlexTouch 200 UNIT/ML Sopn Generic drug: Insulin Degludec Inject 50 Units into the skin daily.   Trulicity 1.5 BO/1.7PZ Sopn Generic drug: Dulaglutide Inject 1 Act into the skin once a week.   umeclidinium bromide 62.5 MCG/INH Aepb Commonly known as: INCRUSE ELLIPTA Inhale 1 puff into the lungs daily.   Vilazodone HCl 20 MG Tabs Take 2 tablets (40 mg total) by mouth daily. What changed: medication strength      Allergies  Allergen Reactions   Lisinopril Cough  Daptomycin Rash   Metformin And Related Diarrhea   Tape Rash and Other (See Comments)    Adhesive Tape-Burn skin.   Follow-up Information    [name], MD. Schedule an appointment as soon as possible for a visit in 1 week(s).   Specialty: Internal Medicine Why: Hospital follow up Contact information: [address] [place] [address] [phone]        Herminio Commons, MD .   Specialty: Cardiology Contact information: [place] [address] [phone]            The results of significant diagnostics from this hospitalization (including imaging, microbiology, ancillary and laboratory) are listed below for reference.     Significant Diagnostic Studies: CT ABDOMEN PELVIS WO CONTRAST  Result Date: 06/05/2019 CLINICAL DATA:  Unexplained anemia, recent lumbar surgery. EXAM: CT ABDOMEN AND PELVIS WITHOUT CONTRAST TECHNIQUE: Multidetector CT imaging of the abdomen and pelvis was performed following the standard protocol without IV contrast. COMPARISON:  CT abdomen dated 06/02/2019. FINDINGS: Lower chest: LEFT basilar pleural effusion with associated atelectasis, incompletely imaged. Mild atelectasis and/or effusion at the RIGHT lung base. Hepatobiliary: No focal liver abnormality is seen. No gallstones, gallbladder wall thickening, or biliary dilatation. Pancreas: Unremarkable. No pancreatic ductal dilatation or surrounding inflammatory changes. Spleen: Normal in size without focal abnormality. Adrenals/Urinary Tract: Adrenal glands appear normal. Probable RIGHT renal cysts, as previously described. Kidneys otherwise unremarkable without suspicious mass, stone or hydronephrosis. Bladder appears normal. Stomach/Bowel: No dilated large or small bowel loops. No evidence of bowel wall inflammation. Scattered diverticulosis but no focal inflammatory change to suggest acute diverticulitis. New gastrostomy tube in place. Vascular/Lymphatic: Aortic atherosclerosis. No enlarged lymph nodes seen in the abdomen or pelvis. Reproductive: Prostate is unremarkable. Other: Again noted are bilateral retroperitoneal hematomas at the levels of the bilateral iliacus musculature. In the interval, this has increased in extent superiorly to the LEFT psoas musculature, measuring approximately 11 cm extent. Low-density components of the collection posterior and anterior to the LEFT psoas musculature could represent concomitant infection/abscess. Small amount of free intraperitoneal air, presumably related to the interval gastrostomy placement. Musculoskeletal: T11-L4 paraspinal fusion. Hardware appears intact and stable in position. Again noted is the  presumably postsurgical collection of fluid and air within the midline of the lower back soft tissues, stable in extent. IMPRESSION: 1. Bilateral retroperitoneal hematomas again noted at the levels of the bilateral iliacus musculature. In the interval, this has increased in extent superiorly to the LEFT psoas musculature, measuring approximately 11 cm extent at the level of the LEFT psoas. Low-density components of the collection both posterior and anterior to the LEFT psoas musculature could represent extension of hematoma or concomitant infection/abscess (febrile?). 2. Small amount of free intraperitoneal air, presumably related to the interval gastrostomy tube placement. 3. LEFT basilar pleural effusion with associated atelectasis, incompletely imaged. Mild atelectasis and/or effusion at the RIGHT lung base. 4. Stable appearance of the presumably postsurgical collection of fluid and air within the soft tissues of the midline lower back. As indicated on CT report of 06/02/2019, underlying infection cannot be excluded based on appearance alone. 5. Colonic diverticulosis without evidence of acute diverticulitis. Aortic Atherosclerosis (ICD10-I70.0). Electronically Signed   By: Franki Cabot M.D.   On: 06/05/2019 08:18   CT ABDOMEN WO CONTRAST  Addendum Date: 06/02/2019   ADDENDUM REPORT: 06/02/2019 11:48 ADDENDUM: Critical Value/emergent results were called by telephone at the time of interpretation on 06/02/2019 at 11:47 am to provider Candiss Norse,  IR PA, who verbally acknowledged these results and will follow-up with the providing clinical team. Electronically Signed   By: Sandi Mariscal M.D.   On: 06/02/2019 11:48   Result Date: 06/02/2019 CLINICAL DATA:  Evaluate anatomy prior to potential percutaneous gastrostomy tube placement. EXAM: CT ABDOMEN WITHOUT CONTRAST TECHNIQUE: Multidetector CT imaging of the abdomen was performed following the standard protocol without IV contrast. COMPARISON:  Chest  CT-05/20/2019; lumbar spine CT-05/22/2019; 05/11/2019 FINDINGS: The lack of intravenous contrast limits the ability to evaluate solid abdominal organs. Lower chest: Limited visualization of the lower thorax demonstrates trace bilateral effusions and associated bibasilar subsegmental atelectasis, suboptimally evaluated due to patient respiratory artifact. Cardiomegaly. Post mitral valve repair. Coronary artery calcifications. No pericardial effusion. Hepatobiliary: Nodularity hepatic contour as could be seen in the setting cirrhotic change. Apparent high density material within the gallbladder could represent cholelithiasis though could be artifactual due to streak artifact from the patient's paraspinal fusion hardware. No definitive gallbladder wall thickening on this noncontrast examination. No ascites. Pancreas: The pancreas appears atrophic but otherwise normal. Spleen: Normal noncontrast appearance of the spleen. Adrenals/Urinary Tract: No evidence of nephrolithiasis. Note is made of a hypoattenuating (12 Hounsfield unit) approximately 2.7 cm partially exophytic lesion arising from the posterosuperior aspect the right kidney (image 34, series 3), incompletely characterized though favored to represent a renal cyst. No urinary obstruction or perinephric stranding. Normal noncontrast appearance of the bilateral adrenal glands. The urinary bladder was not imaged. Stomach/Bowel: The anterior wall the stomach is well apposed against the ventral wall of the upper abdomen without interposed hepatic parenchyma or transverse colon. Enteric tube terminates within the mid body of the stomach. Scattered minimal colonic diverticulosis without evidence of superimposed acute diverticulitis. No pneumoperitoneum, pneumatosis or portal venous gas. Vascular/Lymphatic: Atherosclerotic plaque within a normal caliber abdominal aorta. Other: Note is made of retroperitoneal hematomas affecting the bilateral iliacus musculature (image  63, series 3), similar in hind site compared to lumbar spine CT 05/25/2019. Scattered subcutaneous emphysema and stranding about the lower lumbar spine operative site including an approximately 12.6 x 6.3 x 2.9 cm mixed air and fluid collection about the midline of the low back again (axial image 38, series 3; sagittal image 77, series 7). Musculoskeletal: The patient has undergone T11-L4 paraspinal fusion without evidence of hardware failure or loosening though there is persistent widening involving the anterior aspect of the L1-L2 intervertebral disc space. Stable sequela of ankylosing spondylitis. Old left 7th posterolateral rib fracture. IMPRESSION: 1. Gastric anatomy amenable to potential percutaneous gastrostomy tube placement as indicated. 2. Retroperitoneal hematomas affecting the bilateral iliacus musculature, similar in hind site to lumbar spine CT performed 05/25/2019. Correlation with hematocrit levels is advised. 3. Interval T11-L4 paraspinal fusion without evidence of hardware failure or loosening, though note is made of an approximately 12.6 cm mixed air and fluid collection about the midline of the low back, presumably postoperative in etiology though underlying infection is not excluded. Clinical correlation is advised. 4.  Aortic Atherosclerosis (ICD10-I70.0). Electronically Signed: By: Sandi Mariscal M.D. On: 06/02/2019 11:30   DG Lumbar Spine 2-3 Views  Result Date: 05/25/2019 CLINICAL DATA:  T11-L4 instrumentation EXAM: LUMBAR SPINE - 2-3 VIEW COMPARISON:  None. FINDINGS: Multiple intraoperative fluoroscopic spot images are provided. Posterior lumbar fusion from T11 through L4. FLUOROSCOPY TIME:  2 minutes 35 seconds IMPRESSION: Intraoperative localization. Electronically Signed   By: Kathreen Devoid   On: 05/25/2019 13:21   DG Abd 1 View  Result Date: 06/13/2019 CLINICAL DATA:  Abdominal pain EXAM:  ABDOMEN - 1 VIEW COMPARISON:  CT abdomen pelvis dated 06/05/2019 FINDINGS: The bowel gas  pattern is nonobstructive. Air-fluid levels and free intraperitoneal air cannot be excluded on this supine exam. Lumbar spine fixation hardware is noted. Median sternotomy wires and a right hip arthroplasty are seen. Degenerative changes are seen in the spine and left hip. IMPRESSION: Nonobstructive bowel gas pattern. Electronically Signed   By: Zerita Boers M.D.   On: 06/13/2019 15:25   CT LUMBAR SPINE WO CONTRAST  Result Date: 05/22/2019 CLINICAL DATA:  Preop for spine surgery. Mazor protocol. History of fracture through the L1-2 disc space. EXAM: CT LUMBAR SPINE WITHOUT CONTRAST TECHNIQUE: Multidetector CT imaging of the lumbar spine was performed without intravenous contrast administration. Multiplanar CT image reconstructions were also generated. COMPARISON:  CT lumbar spine 05/11/2019 FINDINGS: Segmentation: There are five lumbar type vertebral bodies. The last full intervertebral disc space is labeled L5-S1. This correlates with the prior studies. Alignment: Normal in stable. Vertebrae: Stable age advanced osteoporosis and severe multilevel facet disease. Stable widening anteriorly at the L1-2 disc space. I do not see any definite disruption of the facets at this level. No new/acute bony findings. Paraspinal and other soft tissues: No significant findings. Disc levels: No interval change since the recent CT scan from 10 days ago. There is multilevel disc disease and severe facet disease with large bridging fusing osteophytes likely changes of DISH. Ankylosing spondylitis is another possibility. Stable wide decompressive laminectomy at L5-S1. IMPRESSION: Stable appearance of the lumbar spine when compared to 2 prior studies from 05/06/2019 and 05/11/2019. Fracture through the L1-2 disc space with anterior widening but no obvious posterior element fractures. Stable underlying changes of ankylosing spondylitis or DISH. Electronically Signed   By: Marijo Sanes M.D.   On: 05/22/2019 11:48   IR GASTROSTOMY  TUBE MOD SED  Result Date: 06/04/2019 CLINICAL DATA:  Dysphagia and need for percutaneous gastrostomy tube for long-term nutrition. EXAM: PERCUTANEOUS GASTROSTOMY TUBE PLACEMENT ANESTHESIA/SEDATION: 1.0 mg IV Versed; 50 mcg IV Fentanyl. Total Moderate Sedation Time 10 minutes. The patient's level of consciousness and physiologic status were continuously monitored during the procedure by Radiology nursing. CONTRAST:  45m OMNIPAQUE IOHEXOL 300 MG/ML  SOLN MEDICATIONS: The patient is receiving multiple IV antibiotics and additional antibiotics were not administered for the procedure. FLUOROSCOPY TIME:  1 minutes and 30 seconds.  111 mGy. PROCEDURE: The procedure, risks, benefits, and alternatives were explained to the patient's daughter. Questions regarding the procedure were encouraged and answered. The patient's daughter understands and consents to the procedure. A time-out was performed prior to initiating the procedure. A 5-French catheter was then advanced through the patient's mouth under fluoroscopy into the esophagus and to the level of the stomach. This catheter was used to insufflate the stomach with air under fluoroscopy. The abdominal wall was prepped with chlorhexidine in a sterile fashion, and a sterile drape was applied covering the operative field. A sterile gown and sterile gloves were used for the procedure. Local anesthesia was provided with 1% Lidocaine. A skin incision was made in the upper abdominal wall. Under fluoroscopy, an 18 gauge trocar needle was advanced into the stomach. Contrast injection was performed to confirm intraluminal position of the needle tip. A single T tack was then deployed in the lumen of the stomach. This was brought up to tension at the skin surface. Over a guidewire, a 9-French sheath was advanced into the lumen of the stomach. The wire was left in place as a safety wire. A loop  snare device from a percutaneous gastrostomy kit was then advanced into the stomach. A  floppy guide wire was advanced through the orogastric catheter under fluoroscopy in the stomach. The loop snare advanced through the percutaneous gastric access was used to snare the guide wire. This allowed withdrawal of the loop snare out of the patient's mouth by retraction of the orogastric catheter and wire. A 20-French bumper retention gastrostomy tube was looped around the snare device. It was then pulled back through the patient's mouth. The retention bumper was brought up to the anterior gastric wall. The T tack suture was cut at the skin. The exiting gastrostomy tube was cut to appropriate length and a feeding adapter applied. The catheter was injected with contrast material to confirm position and a fluoroscopic spot image saved. The tube was then flushed with saline. A dressing was applied over the gastrostomy exit site. COMPLICATIONS: None. FINDINGS: The stomach distended well with air allowing safe placement of the gastrostomy tube. After placement, the tip of the gastrostomy tube lies in the body of the stomach. IMPRESSION: Percutaneous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

## 2019-06-17 NOTE — Progress Notes (Signed)
Inpatient Diabetes Program Recommendations  AACE/ADA: New Consensus Statement on Inpatient Glycemic Control (2015)  Target Ranges:  Prepandial:   less than 140 mg/dL      Peak postprandial:   less than 180 mg/dL (1-2 hours)      Critically ill patients:  140 - 180 mg/dL   Lab Results  Component Value Date   GLUCAP 218 (H) 06/17/2019   HGBA1C 7.4 (H) 03/24/2019    Review of Glycemic Control Results for Cody Hale, Cody "DAVID" (MRN [id number]) as of 06/17/2019 09:51  Ref. Range 06/16/2019 08:44 06/16/2019 11:10 06/16/2019 17:04 06/16/2019 20:04 06/17/2019 00:26 06/17/2019 04:17 06/17/2019 08:41  Glucose-Capillary Latest Ref Range: 70 - 99 mg/dL 247 (H) 234 (H) 187 (H) 146 (H) 235 (H) 257 (H) 218 (H)     Diabetes history: DM 2 Outpatient Diabetes medications:  Trulicity 1.5 mg weekly, Novolog 5 units tid with meals, Tresiba 50 units daily Current orders for Inpatient glycemic control:  Novolog Resistant q 4 hours Novolog 4 units q 4 hours Lantus 20 units daily  Osmolite 474 ml Tube feed bolus tid (1000, 1600, 2200)  Inpatient Diabetes Program Recommendations:     Glucose in the 200's still.  Consider increasing Novolog tube feed coverage to 6-8 units tid with tube feed boluses (1000, 1600, 2200).   Thanks  Tama Headings RN, MSN, BC-ADM Inpatient Diabetes Coordinator Team Pager [phone] (8a-5p)

## 2019-06-17 NOTE — Discharge Instructions (Signed)

## 2019-06-17 NOTE — Progress Notes (Signed)
Nutrition Follow-up  DOCUMENTATION CODES:   Obesity unspecified  INTERVENTION:  Continue bolus tube feeds using Osmolite 1.5 formula via PEG at goal volume of 474 ml (2 cartons/ARCs) given TID.   Provide 30 ml Prostat BID per tube.   Free water flushes of 200 ml every 4 hours per tube. (MD to adjust as appropriate)  Tube feeding regimen to provide 2333 kcal (100% of needs), 119 grams of protein, and 2281 ml free water.  NUTRITION DIAGNOSIS:   Increased nutrient needs related to wound healing, acute illness as evidenced by estimated needs; ongoing  GOAL:   Patient will meet greater than or equal to 90% of their needs; met with TF  MONITOR:   Skin, TF tolerance, Weight trends, Labs, I & O's  REASON FOR ASSESSMENT:   Consult Enteral/tube feeding initiation and management  ASSESSMENT:   68 y.o. 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  Patient fell onto his buttock.Patient is being admitted for further management of lumbar fracture.  10/29 admit, Covid+ 11/13 Cortrak tube placed 11/17 ORIF of L1-L2; multilevel spine stabilization from T11-L4 11/27- s/p g-tube placement  12/2- Underwent MBS, pt with silent aspiration, severe aspiration risk, continued NPO status   Pt has been tolerating his bolus tube feeds well viva PEG. RD to continue with current orders and monitor for tolerance. Noted, pt with infected gastrostomy tube. Plans to treat through possible infection with antibiotics.   Labs and medications reviewed.   Diet Order:   Diet Order            Diet NPO time specified Except for: Sips with Meds  Diet effective midnight              EDUCATION NEEDS:   No education needs have been identified at this time  Skin:  Skin Assessment: Skin Integrity Issues: Skin  Integrity Issues:: Stage II, Incisions Stage II: buttocks Incisions: closed back  Last BM:  12/9  Height:   Ht Readings from Last 1 Encounters:  05/06/19 _0  (1.753 m)    Weight:   Wt Readings from Last 1 Encounters:  06/17/19 115.2 kg    Ideal Body Weight:  72.7 kg  BMI:  Body mass index is 37.5 kg/m.  Estimated Nutritional Needs:   Kcal:  2200-2400  Protein:  115-130 grams  Fluid:  > 2 L    Corrin Parker, MS, RD, LDN Pager # [phone] After hours/ weekend pager # [phone]

## 2019-06-17 NOTE — Progress Notes (Signed)
Patient refused flutter. Cody Hale he has too much pain.

## 2019-06-17 NOTE — Progress Notes (Signed)
  NEUROSURGERY PROGRESS NOTE   Chart reviewed.  Cultures remain negative to date No change in NS plan of care Wound care per [facility] nursing

## 2019-06-18 DIAGNOSIS — Z87891 Personal history of nicotine dependence: Secondary | ICD-10-CM | POA: Diagnosis not present

## 2019-06-18 DIAGNOSIS — Z23 Encounter for immunization: Secondary | ICD-10-CM | POA: Diagnosis not present

## 2019-06-18 DIAGNOSIS — R0902 Hypoxemia: Secondary | ICD-10-CM | POA: Diagnosis not present

## 2019-06-18 DIAGNOSIS — Z794 Long term (current) use of insulin: Secondary | ICD-10-CM | POA: Diagnosis not present

## 2019-06-18 DIAGNOSIS — Z7401 Bed confinement status: Secondary | ICD-10-CM | POA: Diagnosis not present

## 2019-06-18 DIAGNOSIS — F4321 Adjustment disorder with depressed mood: Secondary | ICD-10-CM | POA: Diagnosis not present

## 2019-06-18 DIAGNOSIS — K3184 Gastroparesis: Secondary | ICD-10-CM | POA: Diagnosis not present

## 2019-06-18 DIAGNOSIS — Z8616 Personal history of COVID-19: Secondary | ICD-10-CM | POA: Diagnosis not present

## 2019-06-18 DIAGNOSIS — R52 Pain, unspecified: Secondary | ICD-10-CM | POA: Diagnosis not present

## 2019-06-18 DIAGNOSIS — I4891 Unspecified atrial fibrillation: Secondary | ICD-10-CM | POA: Diagnosis not present

## 2019-06-18 DIAGNOSIS — R1312 Dysphagia, oropharyngeal phase: Secondary | ICD-10-CM | POA: Diagnosis present

## 2019-06-18 DIAGNOSIS — E1169 Type 2 diabetes mellitus with other specified complication: Secondary | ICD-10-CM | POA: Diagnosis not present

## 2019-06-18 DIAGNOSIS — R1310 Dysphagia, unspecified: Secondary | ICD-10-CM | POA: Diagnosis present

## 2019-06-18 DIAGNOSIS — I5022 Chronic systolic (congestive) heart failure: Secondary | ICD-10-CM | POA: Diagnosis not present

## 2019-06-18 DIAGNOSIS — L89122 Pressure ulcer of left upper back, stage 2: Secondary | ICD-10-CM | POA: Diagnosis not present

## 2019-06-18 DIAGNOSIS — I2510 Atherosclerotic heart disease of native coronary artery without angina pectoris: Secondary | ICD-10-CM | POA: Diagnosis not present

## 2019-06-18 DIAGNOSIS — M4327 Fusion of spine, lumbosacral region: Secondary | ICD-10-CM | POA: Diagnosis present

## 2019-06-18 DIAGNOSIS — I482 Chronic atrial fibrillation, unspecified: Secondary | ICD-10-CM | POA: Diagnosis not present

## 2019-06-18 DIAGNOSIS — E119 Type 2 diabetes mellitus without complications: Secondary | ICD-10-CM | POA: Diagnosis not present

## 2019-06-18 DIAGNOSIS — J9601 Acute respiratory failure with hypoxia: Secondary | ICD-10-CM | POA: Diagnosis not present

## 2019-06-18 DIAGNOSIS — M6281 Muscle weakness (generalized): Secondary | ICD-10-CM | POA: Diagnosis present

## 2019-06-18 DIAGNOSIS — J984 Other disorders of lung: Secondary | ICD-10-CM | POA: Diagnosis not present

## 2019-06-18 DIAGNOSIS — J449 Chronic obstructive pulmonary disease, unspecified: Secondary | ICD-10-CM | POA: Diagnosis present

## 2019-06-18 DIAGNOSIS — J9611 Chronic respiratory failure with hypoxia: Secondary | ICD-10-CM | POA: Diagnosis not present

## 2019-06-18 DIAGNOSIS — I5030 Unspecified diastolic (congestive) heart failure: Secondary | ICD-10-CM | POA: Diagnosis not present

## 2019-06-18 DIAGNOSIS — R279 Unspecified lack of coordination: Secondary | ICD-10-CM | POA: Diagnosis not present

## 2019-06-18 DIAGNOSIS — T85528A Displacement of other gastrointestinal prosthetic devices, implants and grafts, initial encounter: Secondary | ICD-10-CM | POA: Diagnosis not present

## 2019-06-18 DIAGNOSIS — I110 Hypertensive heart disease with heart failure: Secondary | ICD-10-CM | POA: Diagnosis not present

## 2019-06-18 DIAGNOSIS — E1143 Type 2 diabetes mellitus with diabetic autonomic (poly)neuropathy: Secondary | ICD-10-CM | POA: Diagnosis not present

## 2019-06-18 DIAGNOSIS — Z79899 Other long term (current) drug therapy: Secondary | ICD-10-CM | POA: Diagnosis not present

## 2019-06-18 DIAGNOSIS — E11628 Type 2 diabetes mellitus with other skin complications: Secondary | ICD-10-CM | POA: Diagnosis not present

## 2019-06-18 DIAGNOSIS — T8131XD Disruption of external operation (surgical) wound, not elsewhere classified, subsequent encounter: Secondary | ICD-10-CM | POA: Diagnosis not present

## 2019-06-18 DIAGNOSIS — Z743 Need for continuous supervision: Secondary | ICD-10-CM | POA: Diagnosis not present

## 2019-06-18 DIAGNOSIS — Z7901 Long term (current) use of anticoagulants: Secondary | ICD-10-CM | POA: Diagnosis not present

## 2019-06-18 DIAGNOSIS — R112 Nausea with vomiting, unspecified: Secondary | ICD-10-CM | POA: Diagnosis not present

## 2019-06-18 DIAGNOSIS — R4182 Altered mental status, unspecified: Secondary | ICD-10-CM | POA: Diagnosis not present

## 2019-06-18 DIAGNOSIS — R2689 Other abnormalities of gait and mobility: Secondary | ICD-10-CM | POA: Diagnosis present

## 2019-06-18 DIAGNOSIS — F29 Unspecified psychosis not due to a substance or known physiological condition: Secondary | ICD-10-CM | POA: Diagnosis not present

## 2019-06-18 DIAGNOSIS — K9423 Gastrostomy malfunction: Secondary | ICD-10-CM | POA: Diagnosis not present

## 2019-06-18 DIAGNOSIS — J9621 Acute and chronic respiratory failure with hypoxia: Secondary | ICD-10-CM | POA: Diagnosis present

## 2019-06-18 DIAGNOSIS — T8131XA Disruption of external operation (surgical) wound, not elsewhere classified, initial encounter: Secondary | ICD-10-CM | POA: Diagnosis not present

## 2019-06-18 DIAGNOSIS — K9429 Other complications of gastrostomy: Secondary | ICD-10-CM | POA: Diagnosis not present

## 2019-06-18 DIAGNOSIS — R5381 Other malaise: Secondary | ICD-10-CM | POA: Diagnosis not present

## 2019-06-18 DIAGNOSIS — Z741 Need for assistance with personal care: Secondary | ICD-10-CM | POA: Diagnosis present

## 2019-06-18 DIAGNOSIS — R7881 Bacteremia: Secondary | ICD-10-CM | POA: Diagnosis not present

## 2019-06-18 DIAGNOSIS — R488 Other symbolic dysfunctions: Secondary | ICD-10-CM | POA: Diagnosis present

## 2019-06-18 DIAGNOSIS — E1165 Type 2 diabetes mellitus with hyperglycemia: Secondary | ICD-10-CM | POA: Diagnosis present

## 2019-06-18 DIAGNOSIS — Y738 Miscellaneous gastroenterology and urology devices associated with adverse incidents, not elsewhere classified: Secondary | ICD-10-CM | POA: Diagnosis not present

## 2019-06-18 DIAGNOSIS — I509 Heart failure, unspecified: Secondary | ICD-10-CM | POA: Diagnosis not present

## 2019-06-18 DIAGNOSIS — M2550 Pain in unspecified joint: Secondary | ICD-10-CM | POA: Diagnosis not present

## 2019-06-18 DIAGNOSIS — E639 Nutritional deficiency, unspecified: Secondary | ICD-10-CM | POA: Diagnosis not present

## 2019-06-18 DIAGNOSIS — B351 Tinea unguium: Secondary | ICD-10-CM | POA: Diagnosis not present

## 2019-06-18 DIAGNOSIS — M5489 Other dorsalgia: Secondary | ICD-10-CM | POA: Diagnosis not present

## 2019-06-18 LAB — CBC
HCT: 29.4 % — ABNORMAL LOW (ref 39.0–52.0)
Hemoglobin: 8.9 g/dL — ABNORMAL LOW (ref 13.0–17.0)
MCH: 27.8 pg (ref 26.0–34.0)
MCHC: 30.3 g/dL (ref 30.0–36.0)
MCV: 91.9 fL (ref 80.0–100.0)
Platelets: 265 10*3/uL (ref 150–400)
RBC: 3.2 MIL/uL — ABNORMAL LOW (ref 4.22–5.81)
RDW: 17.1 % — ABNORMAL HIGH (ref 11.5–15.5)
WBC: 5.7 10*3/uL (ref 4.0–10.5)
nRBC: 0 % (ref 0.0–0.2)

## 2019-06-18 LAB — BASIC METABOLIC PANEL
Anion gap: 8 (ref 5–15)
BUN: 21 mg/dL (ref 8–23)
CO2: 33 mmol/L — ABNORMAL HIGH (ref 22–32)
Calcium: 8.4 mg/dL — ABNORMAL LOW (ref 8.9–10.3)
Chloride: 95 mmol/L — ABNORMAL LOW (ref 98–111)
Creatinine, Ser: 0.66 mg/dL (ref 0.61–1.24)
GFR calc Af Amer: >60 mL/min (ref >60)
GFR calc non Af Amer: >60 mL/min (ref >60)
Glucose, Bld: 147 mg/dL — ABNORMAL HIGH (ref 70–99)
Potassium: 4.5 mmol/L (ref 3.5–5.1)
Sodium: 136 mmol/L (ref 135–145)

## 2019-06-18 LAB — GLUCOSE, CAPILLARY
Glucose-Capillary: 110 mg/dL — ABNORMAL HIGH (ref 70–99)
Glucose-Capillary: 163 mg/dL — ABNORMAL HIGH (ref 70–99)
Glucose-Capillary: 220 mg/dL — ABNORMAL HIGH (ref 70–99)
Glucose-Capillary: 238 mg/dL — ABNORMAL HIGH (ref 70–99)

## 2019-06-18 MED ORDER — MAGNESIUM OXIDE -MG SUPPLEMENT 400 (240 MG) MG PO TABS: 1.0000 | ORAL_TABLET | Freq: Two times a day (BID) | ORAL | 1 refills | Status: DC

## 2019-06-18 MED ORDER — RIVAROXABAN 20 MG PO TABS: ORAL_TABLET | ORAL | Status: AC

## 2019-06-18 MED ORDER — ACETAMINOPHEN 160 MG/5ML PO SOLN: 650.0000 mg | Freq: Four times a day (QID) | ORAL | 0 refills | Status: DC | PRN

## 2019-06-18 MED ORDER — FREE WATER: 200.0000 mL | Status: DC

## 2019-06-18 MED ORDER — OSMOLITE 1.5 CAL PO LIQD: 474.0000 mL | Freq: Three times a day (TID) | ORAL | 0 refills | Status: DC

## 2019-06-18 MED ORDER — TORSEMIDE 20 MG PO TABS: 20.0000 mg | ORAL_TABLET | Freq: Every day | ORAL | Status: DC

## 2019-06-18 MED ORDER — LOSARTAN POTASSIUM 25 MG PO TABS: 12.5000 mg | ORAL_TABLET | Freq: Every day | ORAL | Status: DC

## 2019-06-18 MED ORDER — TAMSULOSIN HCL 0.4 MG PO CAPS: 0.4000 mg | ORAL_CAPSULE | Freq: Every day | ORAL | Status: AC

## 2019-06-18 MED ORDER — NITROGLYCERIN 0.4 MG SL SUBL: 0.4000 mg | SUBLINGUAL_TABLET | SUBLINGUAL | 0 refills | Status: DC | PRN

## 2019-06-18 MED ORDER — SIMVASTATIN 20 MG PO TABS: 20.0000 mg | ORAL_TABLET | Freq: Every morning | ORAL | Status: DC

## 2019-06-18 MED ORDER — VITAMIN C 500 MG/5ML PO SYRP: 500.0000 mg | ORAL_SOLUTION | Freq: Every day | ORAL | 12 refills | Status: DC

## 2019-06-18 MED ORDER — POLYETHYLENE GLYCOL 3350 17 G PO PACK: 17.0000 g | PACK | Freq: Every day | ORAL | 0 refills | Status: DC | PRN

## 2019-06-18 MED ORDER — ZINC OXIDE 40 % EX OINT: TOPICAL_OINTMENT | Freq: Four times a day (QID) | CUTANEOUS | 0 refills | Status: DC

## 2019-06-18 MED ORDER — UMECLIDINIUM BROMIDE 62.5 MCG/INH IN AEPB: 1.0000 | INHALATION_SPRAY | Freq: Every day | RESPIRATORY_TRACT | Status: DC

## 2019-06-18 MED ORDER — SULFAMETHOXAZOLE-TRIMETHOPRIM 800-160 MG PO TABS: 1.0000 | ORAL_TABLET | Freq: Two times a day (BID) | ORAL | Status: AC

## 2019-06-18 MED ORDER — VILAZODONE HCL 20 MG PO TABS: 40.0000 mg | ORAL_TABLET | Freq: Every day | ORAL | Status: DC

## 2019-06-18 MED ORDER — METHOCARBAMOL 500 MG PO TABS: 500.0000 mg | ORAL_TABLET | Freq: Three times a day (TID) | ORAL | Status: DC | PRN

## 2019-06-18 MED ORDER — INSULIN ASPART 100 UNIT/ML ~~LOC~~ SOLN: SUBCUTANEOUS | Status: DC

## 2019-06-18 MED ORDER — CARVEDILOL 3.125 MG PO TABS: 3.1250 mg | ORAL_TABLET | Freq: Two times a day (BID) | ORAL | Status: DC

## 2019-06-18 MED ORDER — PRO-STAT SUGAR FREE PO LIQD: 30.0000 mL | Freq: Two times a day (BID) | ORAL | 0 refills | Status: DC

## 2019-06-18 MED ORDER — LEVOCETIRIZINE DIHYDROCHLORIDE 5 MG PO TABS: 5.0000 mg | ORAL_TABLET | Freq: Every evening | ORAL | 1 refills | Status: AC

## 2019-06-18 MED ORDER — OXYCODONE HCL 5 MG PO TABS: 5.0000 mg | ORAL_TABLET | ORAL | 0 refills | Status: DC | PRN

## 2019-06-18 MED ORDER — ADULT MULTIVITAMIN W/MINERALS CH: 1.0000 | ORAL_TABLET | Freq: Every day | ORAL | Status: AC

## 2019-06-18 MED ORDER — PANTOPRAZOLE SODIUM 40 MG PO PACK: 40.0000 mg | PACK | Freq: Every day | ORAL | Status: AC

## 2019-06-18 NOTE — Progress Notes (Signed)
Physical Therapy Treatment Patient Details Name: Cody Hale MRN: [id number] DOB: 07/08/1951 Today's Date: 06/18/2019    History of Present Illness Pt is a 68 yo male presented 05/06/19 to ED after fall  (he tripped over some blankets), onto his buttocks with severe back pain;  CT lumbar spine showing unstable fracture L1-2 and likely ankylosing spondylitis; tested +COVID and developed fever and required TEE to r/o endocarditis prior to surgery;  11/17 underwent open reduction internal fixation of L1-L2 Chance fracture with multilevel spine stabilization from T11-L4. Remained intubated until 07/25/18 PMHx: Afib, MVR, SVT, anxiety, CAD, COPD, CHF, HTN,     PT Comments    Pt with improved tolerance for EOB sitting today, and tolerated low-level LE exercises. Pt with difficulty maintaining balance at EOB with dynamic LE movement, requiring mod assist for trunk support during activity. Pt continues to refuse to use brace due to discomfort, RN aware. Pt plans to d/c to SNF today.    Follow Up Recommendations  SNF     Equipment Recommendations  Other (comment)    Recommendations for Other Services       Precautions / Restrictions Precautions Precautions: Back;Fall Precaution Booklet Issued: No Precaution Comments: requiring cues for back precautions, log roll technique Required Braces or Orthoses: Spinal Brace Spinal Brace: Thoracolumbosacral orthotic;Applied in sitting position Spinal Brace Comments: pt refuses TLSO due to pain, even with PT encouragement to don Restrictions Weight Bearing Restrictions: No    Mobility  Bed Mobility Overal bed mobility: Needs Assistance Bed Mobility: Rolling;Sidelying to Sit;Sit to Sidelying Rolling: Mod assist;+2 for physical assistance;+2 for safety/equipment Sidelying to sit: Max assist     Sit to sidelying: Max assist;+2 for safety/equipment General bed mobility comments: mod assist for rolling bilaterally, towards R to sit EOB and towards L  for positioning post-PT. Verbal cuing for log roll technique and use of bedrails for rolling. Max assist for sidelying<>sit for trunk and LE management, scooting to and from EOB, and scooting pt up in bed upon return to supine. Pt positioning in slight L sidelying for posterior pressure relief.  Transfers                    Ambulation/Gait                 Stairs             Wheelchair Mobility    Modified Rankin (Stroke Patients Only)       Balance Overall balance assessment: Needs assistance Sitting-balance support: Bilateral upper extremity supported;Feet supported Sitting balance-Leahy Scale: Fair Sitting balance - Comments: able to sit EOB with min Hale assist for ~8 minutes, requires min-mod assist to correct posterior leaning with dynamic sitting. Postural control: Posterior lean     Standing balance comment: Pt unable to stand on this date.                            Cognition Arousal/Alertness: Awake/alert Behavior During Therapy: Anxious;WFL for tasks assessed/performed Overall Cognitive Status: Impaired/Different from baseline Area of Impairment: Memory;Following commands;Safety/judgement;Problem solving                     Memory: Decreased recall of precautions;Decreased short-term memory Following Commands: Follows one step commands with increased time;Follows one step commands consistently Safety/Judgement: Decreased awareness of safety   Problem Solving: Decreased initiation;Requires verbal cues;Requires tactile cues;Difficulty sequencing General Comments: Pt anxious sitting EOB, but otherwise pt with improved affect  and cooperation with PT today. PT reinforced back precautions throughout bed mobility for pt safety, multimodal cuing for sequencing to and from EOB.      Exercises General Exercises - Lower Extremity Quad Sets: AAROM;Both;10 reps;Supine Toe Raises: AROM;Both;10 reps;Seated Heel Raises: AROM;Both;10  reps;Seated    General Comments General comments (skin integrity, edema, etc.): vss - ST change on monitor, RN states this is normal for pt      Pertinent Vitals/Pain Pain Assessment: Faces Faces Pain Scale: Hurts even more Pain Location: low back Pain Descriptors / Indicators: Spasm;Sore Pain Intervention(s): Limited activity within patient's tolerance;Premedicated before session;Monitored during session;Repositioned    Home Living                      Prior Function            PT Goals (current goals can now be found in the care plan section) Acute Rehab PT Goals Patient Stated Goal: none stated PT Goal Formulation: With patient Time For Goal Achievement: 06/23/19 Potential to Achieve Goals: Fair Progress towards PT goals: Progressing toward goals    Frequency    Min 5X/week      PT Plan Current plan remains appropriate    Co-evaluation              AM-PAC PT "6 Clicks" Mobility   Outcome Measure  Help needed turning from your back to your side while in a flat bed without using bedrails?: A Lot Help needed moving from lying on your back to sitting on the side of a flat bed without using bedrails?: Total Help needed moving to and from a bed to a chair (including a wheelchair)?: Total Help needed standing up from a chair using your arms (e.g., wheelchair or bedside chair)?: Total Help needed to walk in hospital room?: Total Help needed climbing 3-5 steps with a railing? : Total 6 Click Score: 7    End of Session Equipment Utilized During Treatment: Back brace(pt refused to use back brace, states it is too uncomfortable) Activity Tolerance: Patient limited by pain;Patient limited by fatigue Patient left: in bed;with nursing/sitter in room;with SCD's reapplied;with call bell/phone within reach;with bed alarm set(SCDs taken off due to wear marks on LEs) Nurse Communication: Mobility status PT Visit Diagnosis: Other abnormalities of gait and mobility  (R26.89);Muscle weakness (generalized) (M62.81);Pain;History of falling (Z91.81) Pain - part of body: Hip;Leg     Time: 6834-1962 PT Time Calculation (min) (ACUTE ONLY): 26 min  Charges:  $Therapeutic Exercise: 8-22 mins $Neuromuscular Re-education: 8-22 mins                     Espn Zeman E, PT Acute Rehabilitation Services Pager [phone]  Office [phone]    Rashay Barnette D Averianna Brugger 06/18/2019, 9:42 AM

## 2019-06-18 NOTE — TOC Transition Note (Signed)
Transition of Care [facility]) - CM/SW Discharge Note   Patient Details  Name: Cody Hale MRN: [id number] Date of Birth: 06/01/1951  [facility] CM/SW Contact:  Vinie Sill, [place] Phone Number: 06/18/2019, 11:56 AM   Clinical Narrative:     Patient will DC to: [place]  DC Date: 06/18/2019 Family Notified: Domingo Dimes Transport BP:[facility]  RN, patient, and facility notified of DC. Discharge Summary sent to facility. RN given number for [phone], Room 125. Ambulance transport requested for patient.   Clinical Social Worker signing off. Thurmond Butts, MSW, [facility] Clinical Social Worker [phone]    Final next level of care: Skilled Nursing Facility Barriers to Discharge: Barriers Resolved   Patient Goals and CMS Choice Patient states their goals for this hospitalization and ongoing recovery are:: "To be able to go back home." CMS Medicare.gov Compare Post Acute Care list provided to:: Patient Choice offered to / list presented to : Patient  Discharge Placement PASRR number recieved: 05/30/19            Patient chooses bed at: [facility]) Patient to be transferred to facility by: [place] Name of family member notified: Keith,son Patient and family notified of of transfer: 06/18/19  Discharge Plan and Services                                     Social Determinants of Health (SDOH) Interventions     Readmission Risk Interventions No flowsheet data found.

## 2019-06-18 NOTE — Progress Notes (Signed)
  NEUROSURGERY PROGRESS NOTE   No issues overnight.  Restarted Xarelto yesterday Denies change in motor/sensory No significant back pain  EXAM:  BP 129/74 (BP Location: Left Arm)   Pulse 78   Temp 99.2 F (37.3 C) (Oral)   Resp 20   Ht 5\' 9"  (1.753 m)   Wt 115.2 kg   SpO2 91%   BMI 37.50 kg/m   Awake, alert Speech slow but appropriate   Stable motor exam, BUE/BLE Incision: bandage in place. No drainage  PLAN Stable neurologically Plan to be discharged to SNF today Wound care per [facility] F/U 1-2 weeks

## 2019-06-18 NOTE — Progress Notes (Addendum)
[facility] arrived to transport patient to [place]. PICC line dc'd, tip intact. PEG intact with clean and dry dressing. Rectal pouch condom cath left in place for transport. Belongings and AVS to Transport.  Report called to [facility] and hand off given to [place]. My number left for her to call if any questions. Vital signs stable, pt. Alert and aware of transport. Condom cath in place. Afib continued prior to discharge from telemetry. PM x4 with weakness and spasms in legs.PT worked with pt and sat on side of bed for 5 minutes. PT needs a lot of encouragement. Back dressing changed, minimal drainage. Sacral foam replaced for Stage 2 right sacral area, also reported to [place].  Simmie Davies RN

## 2019-06-19 LAB — AEROBIC/ANAEROBIC CULTURE W GRAM STAIN (SURGICAL/DEEP WOUND)
Culture: NORMAL
Gram Stain: NONE SEEN

## 2019-06-22 DIAGNOSIS — J9601 Acute respiratory failure with hypoxia: Secondary | ICD-10-CM | POA: Diagnosis not present

## 2019-06-22 DIAGNOSIS — I509 Heart failure, unspecified: Secondary | ICD-10-CM | POA: Diagnosis not present

## 2019-06-22 DIAGNOSIS — I4891 Unspecified atrial fibrillation: Secondary | ICD-10-CM | POA: Diagnosis not present

## 2019-06-22 DIAGNOSIS — J449 Chronic obstructive pulmonary disease, unspecified: Secondary | ICD-10-CM | POA: Diagnosis not present

## 2019-06-24 ENCOUNTER — Encounter: Payer: Self-pay | Admitting: Cardiovascular Disease

## 2019-06-24 ENCOUNTER — Encounter (INDEPENDENT_AMBULATORY_CARE_PROVIDER_SITE_OTHER): Payer: Medicare Other | Admitting: Cardiovascular Disease

## 2019-06-24 DIAGNOSIS — R5381 Other malaise: Secondary | ICD-10-CM | POA: Diagnosis not present

## 2019-06-24 DIAGNOSIS — J9601 Acute respiratory failure with hypoxia: Secondary | ICD-10-CM | POA: Diagnosis not present

## 2019-06-24 DIAGNOSIS — J449 Chronic obstructive pulmonary disease, unspecified: Secondary | ICD-10-CM | POA: Diagnosis not present

## 2019-06-24 DIAGNOSIS — T8131XA Disruption of external operation (surgical) wound, not elsewhere classified, initial encounter: Secondary | ICD-10-CM | POA: Diagnosis not present

## 2019-06-24 NOTE — Progress Notes (Signed)
This encounter was created in error - please disregard.

## 2019-06-24 NOTE — Progress Notes (Signed)
Erroneous encounter This encounter was created in error - please disregard. 
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
## 2019-06-30 IMAGING — DX DG PORTABLE PELVIS
1 series · 1 of 1 positions shown · non-contrast
Comparison: Radiograph May 20, 2017.

CLINICAL DATA: Status post right total hip replacement.

EXAM:
PORTABLE PELVIS 1-2 VIEWS

[pelvis ap]
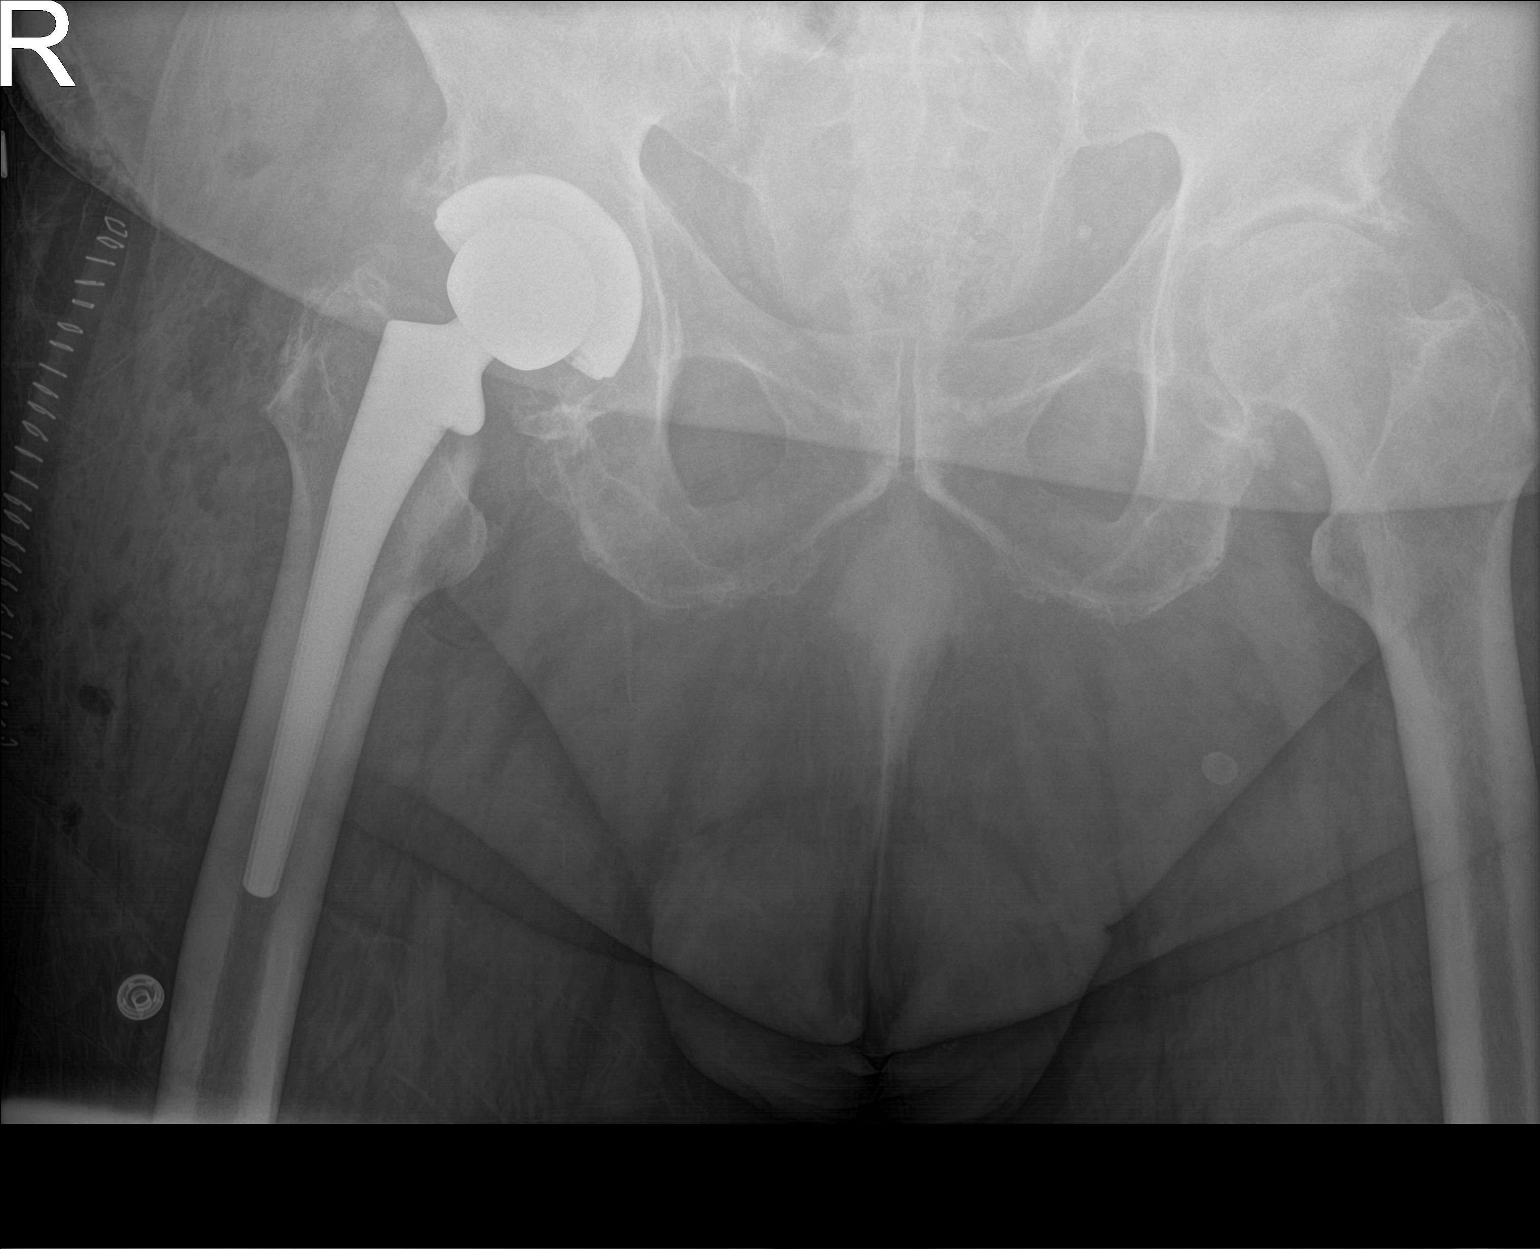

[1 of 1 positions shown; findings below may reference images not displayed]

FINDINGS: The right femoral and acetabular components are well situated. No
fracture or dislocation is noted. Expected postoperative changes are
noted in the surrounding soft tissues.
IMPRESSION: Status post right total hip arthroplasty.

## 2019-07-05 ENCOUNTER — Ambulatory Visit: Payer: Medicare Other | Admitting: Internal Medicine

## 2019-07-09 DIAGNOSIS — M4327 Fusion of spine, lumbosacral region: Secondary | ICD-10-CM | POA: Diagnosis present

## 2019-07-09 DIAGNOSIS — J9621 Acute and chronic respiratory failure with hypoxia: Secondary | ICD-10-CM | POA: Diagnosis present

## 2019-07-09 DIAGNOSIS — R0902 Hypoxemia: Secondary | ICD-10-CM | POA: Diagnosis not present

## 2019-07-09 DIAGNOSIS — K9429 Other complications of gastrostomy: Secondary | ICD-10-CM | POA: Diagnosis not present

## 2019-07-09 DIAGNOSIS — E1169 Type 2 diabetes mellitus with other specified complication: Secondary | ICD-10-CM | POA: Diagnosis not present

## 2019-07-09 DIAGNOSIS — R1310 Dysphagia, unspecified: Secondary | ICD-10-CM | POA: Diagnosis present

## 2019-07-09 DIAGNOSIS — R488 Other symbolic dysfunctions: Secondary | ICD-10-CM | POA: Diagnosis present

## 2019-07-09 DIAGNOSIS — Z741 Need for assistance with personal care: Secondary | ICD-10-CM | POA: Diagnosis present

## 2019-07-09 DIAGNOSIS — R5381 Other malaise: Secondary | ICD-10-CM | POA: Diagnosis not present

## 2019-07-09 DIAGNOSIS — I2510 Atherosclerotic heart disease of native coronary artery without angina pectoris: Secondary | ICD-10-CM | POA: Diagnosis not present

## 2019-07-09 DIAGNOSIS — E1165 Type 2 diabetes mellitus with hyperglycemia: Secondary | ICD-10-CM | POA: Diagnosis present

## 2019-07-09 DIAGNOSIS — Y738 Miscellaneous gastroenterology and urology devices associated with adverse incidents, not elsewhere classified: Secondary | ICD-10-CM | POA: Diagnosis not present

## 2019-07-09 DIAGNOSIS — T8131XD Disruption of external operation (surgical) wound, not elsewhere classified, subsequent encounter: Secondary | ICD-10-CM | POA: Diagnosis not present

## 2019-07-09 DIAGNOSIS — I5022 Chronic systolic (congestive) heart failure: Secondary | ICD-10-CM | POA: Diagnosis not present

## 2019-07-09 DIAGNOSIS — Z87891 Personal history of nicotine dependence: Secondary | ICD-10-CM | POA: Diagnosis not present

## 2019-07-09 DIAGNOSIS — J449 Chronic obstructive pulmonary disease, unspecified: Secondary | ICD-10-CM | POA: Diagnosis present

## 2019-07-09 DIAGNOSIS — Z794 Long term (current) use of insulin: Secondary | ICD-10-CM | POA: Diagnosis not present

## 2019-07-09 DIAGNOSIS — R2689 Other abnormalities of gait and mobility: Secondary | ICD-10-CM | POA: Diagnosis present

## 2019-07-09 DIAGNOSIS — Z7901 Long term (current) use of anticoagulants: Secondary | ICD-10-CM | POA: Diagnosis not present

## 2019-07-09 DIAGNOSIS — M2550 Pain in unspecified joint: Secondary | ICD-10-CM | POA: Diagnosis not present

## 2019-07-09 DIAGNOSIS — E119 Type 2 diabetes mellitus without complications: Secondary | ICD-10-CM | POA: Diagnosis not present

## 2019-07-09 DIAGNOSIS — Z79899 Other long term (current) drug therapy: Secondary | ICD-10-CM | POA: Diagnosis not present

## 2019-07-09 DIAGNOSIS — Z7401 Bed confinement status: Secondary | ICD-10-CM | POA: Diagnosis not present

## 2019-07-09 DIAGNOSIS — K3184 Gastroparesis: Secondary | ICD-10-CM | POA: Diagnosis not present

## 2019-07-09 DIAGNOSIS — I110 Hypertensive heart disease with heart failure: Secondary | ICD-10-CM | POA: Diagnosis not present

## 2019-07-09 DIAGNOSIS — M6281 Muscle weakness (generalized): Secondary | ICD-10-CM | POA: Diagnosis present

## 2019-07-09 DIAGNOSIS — Z8616 Personal history of covid-19: Secondary | ICD-10-CM | POA: Diagnosis not present

## 2019-07-09 DIAGNOSIS — K9423 Gastrostomy malfunction: Secondary | ICD-10-CM | POA: Diagnosis present

## 2019-07-09 DIAGNOSIS — R1312 Dysphagia, oropharyngeal phase: Secondary | ICD-10-CM | POA: Diagnosis present

## 2019-07-09 DIAGNOSIS — E1143 Type 2 diabetes mellitus with diabetic autonomic (poly)neuropathy: Secondary | ICD-10-CM | POA: Diagnosis not present

## 2019-07-09 DIAGNOSIS — T8131XA Disruption of external operation (surgical) wound, not elsewhere classified, initial encounter: Secondary | ICD-10-CM | POA: Diagnosis not present

## 2019-07-09 DIAGNOSIS — T85528A Displacement of other gastrointestinal prosthetic devices, implants and grafts, initial encounter: Secondary | ICD-10-CM | POA: Diagnosis present

## 2019-07-09 DIAGNOSIS — E639 Nutritional deficiency, unspecified: Secondary | ICD-10-CM | POA: Diagnosis not present

## 2019-07-09 DIAGNOSIS — I4891 Unspecified atrial fibrillation: Secondary | ICD-10-CM | POA: Diagnosis not present

## 2019-07-09 DIAGNOSIS — R4182 Altered mental status, unspecified: Secondary | ICD-10-CM | POA: Diagnosis not present

## 2019-07-14 DIAGNOSIS — I5030 Unspecified diastolic (congestive) heart failure: Secondary | ICD-10-CM | POA: Diagnosis not present

## 2019-07-14 DIAGNOSIS — L89122 Pressure ulcer of left upper back, stage 2: Secondary | ICD-10-CM | POA: Diagnosis not present

## 2019-07-14 DIAGNOSIS — E11628 Type 2 diabetes mellitus with other skin complications: Secondary | ICD-10-CM | POA: Diagnosis not present

## 2019-07-14 DIAGNOSIS — R1310 Dysphagia, unspecified: Secondary | ICD-10-CM | POA: Diagnosis not present

## 2019-07-18 ENCOUNTER — Emergency Department (HOSPITAL_COMMUNITY)
Admission: EM | Admit: 2019-07-18 | Discharge: 2019-07-18 | Disposition: A | Discharge status: Home / Self Care | Payer: Medicare Other | Attending: Emergency Medicine | Admitting: Emergency Medicine

## 2019-07-18 ENCOUNTER — Emergency Department (HOSPITAL_COMMUNITY): Payer: Medicare Other

## 2019-07-18 ENCOUNTER — Other Ambulatory Visit: Payer: Self-pay

## 2019-07-18 ENCOUNTER — Encounter (HOSPITAL_COMMUNITY): Payer: Self-pay | Admitting: Emergency Medicine

## 2019-07-18 DIAGNOSIS — I2510 Atherosclerotic heart disease of native coronary artery without angina pectoris: Secondary | ICD-10-CM | POA: Diagnosis not present

## 2019-07-18 DIAGNOSIS — Z7901 Long term (current) use of anticoagulants: Secondary | ICD-10-CM | POA: Insufficient documentation

## 2019-07-18 DIAGNOSIS — K9423 Gastrostomy malfunction: Secondary | ICD-10-CM | POA: Diagnosis not present

## 2019-07-18 DIAGNOSIS — T85528A Displacement of other gastrointestinal prosthetic devices, implants and grafts, initial encounter: Secondary | ICD-10-CM | POA: Diagnosis not present

## 2019-07-18 DIAGNOSIS — Y738 Miscellaneous gastroenterology and urology devices associated with adverse incidents, not elsewhere classified: Secondary | ICD-10-CM | POA: Insufficient documentation

## 2019-07-18 DIAGNOSIS — E639 Nutritional deficiency, unspecified: Secondary | ICD-10-CM | POA: Diagnosis not present

## 2019-07-18 DIAGNOSIS — I110 Hypertensive heart disease with heart failure: Secondary | ICD-10-CM | POA: Diagnosis not present

## 2019-07-18 DIAGNOSIS — Z87891 Personal history of nicotine dependence: Secondary | ICD-10-CM | POA: Insufficient documentation

## 2019-07-18 DIAGNOSIS — R1310 Dysphagia, unspecified: Secondary | ICD-10-CM | POA: Diagnosis not present

## 2019-07-18 DIAGNOSIS — E119 Type 2 diabetes mellitus without complications: Secondary | ICD-10-CM | POA: Insufficient documentation

## 2019-07-18 DIAGNOSIS — Z794 Long term (current) use of insulin: Secondary | ICD-10-CM | POA: Insufficient documentation

## 2019-07-18 DIAGNOSIS — I5022 Chronic systolic (congestive) heart failure: Secondary | ICD-10-CM | POA: Diagnosis not present

## 2019-07-18 DIAGNOSIS — J449 Chronic obstructive pulmonary disease, unspecified: Secondary | ICD-10-CM | POA: Insufficient documentation

## 2019-07-18 DIAGNOSIS — Z79899 Other long term (current) drug therapy: Secondary | ICD-10-CM | POA: Insufficient documentation

## 2019-07-18 HISTORY — PX: IR REPLC GASTRO/COLONIC TUBE PERCUT W/FLUORO: IMG2333

## 2019-07-18 MED ORDER — LIDOCAINE VISCOUS HCL 2 % MT SOLN
OROMUCOSAL | Status: AC
  Filled 2019-07-18: qty 15

## 2019-07-18 MED ORDER — LIDOCAINE VISCOUS HCL 2 % MT SOLN
OROMUCOSAL | Status: AC | PRN
  Administered 2019-07-18: 15 mL via OROMUCOSAL

## 2019-07-18 MED ORDER — IOHEXOL 300 MG/ML  SOLN
50.0000 mL | Freq: Once | INTRAMUSCULAR | Status: AC | PRN
  Administered 2019-07-18: 10 mL

## 2019-07-18 NOTE — ED Notes (Signed)
Pt left via PTAR, attempts were made to call facility with no asnwer, Discharge instructions discussed with Pt. Pt verbalized understanding.

## 2019-07-18 NOTE — Procedures (Signed)
Interventional Radiology Procedure:   Indications: Dislodged gastrostomy tube  Procedure: Gastrostomy replacement  Findings: New 20 Fr gastrostomy in stomach  Complications: None     EBL: None  Plan: Gastrostomy is ready to be used.    Latrina Guttman R. Lowella Dandy, MD  Pager: [phone]
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
## 2019-07-18 NOTE — ED Notes (Signed)
Patient transported to IR 

## 2019-07-18 NOTE — ED Triage Notes (Signed)
Pt to triage via [facility] from [facility].  G-tube out this morning.  Pt denies pain.  Alert to person and place.

## 2019-07-21 DIAGNOSIS — I5030 Unspecified diastolic (congestive) heart failure: Secondary | ICD-10-CM | POA: Diagnosis not present

## 2019-07-21 DIAGNOSIS — L89122 Pressure ulcer of left upper back, stage 2: Secondary | ICD-10-CM | POA: Diagnosis not present

## 2019-07-21 DIAGNOSIS — E11628 Type 2 diabetes mellitus with other skin complications: Secondary | ICD-10-CM | POA: Diagnosis not present

## 2019-07-21 DIAGNOSIS — R1310 Dysphagia, unspecified: Secondary | ICD-10-CM | POA: Diagnosis not present

## 2019-07-22 DIAGNOSIS — R1310 Dysphagia, unspecified: Secondary | ICD-10-CM | POA: Diagnosis not present

## 2019-07-22 DIAGNOSIS — T8131XA Disruption of external operation (surgical) wound, not elsewhere classified, initial encounter: Secondary | ICD-10-CM | POA: Diagnosis not present

## 2019-07-22 DIAGNOSIS — R5381 Other malaise: Secondary | ICD-10-CM | POA: Diagnosis not present

## 2019-07-22 DIAGNOSIS — I4891 Unspecified atrial fibrillation: Secondary | ICD-10-CM | POA: Diagnosis not present

## 2019-07-23 ENCOUNTER — Ambulatory Visit: Payer: Medicare Other

## 2019-07-27 DIAGNOSIS — K9429 Other complications of gastrostomy: Secondary | ICD-10-CM | POA: Diagnosis not present

## 2019-08-05 ENCOUNTER — Encounter (HOSPITAL_COMMUNITY): Payer: Self-pay | Admitting: Emergency Medicine

## 2019-08-05 ENCOUNTER — Other Ambulatory Visit: Payer: Self-pay

## 2019-08-05 ENCOUNTER — Emergency Department (HOSPITAL_COMMUNITY)
Admission: EM | Admit: 2019-08-05 | Discharge: 2019-08-05 | Disposition: A | Discharge status: Skilled Nursing Facility | Payer: Medicare Other | Attending: Emergency Medicine | Admitting: Emergency Medicine

## 2019-08-05 DIAGNOSIS — E1143 Type 2 diabetes mellitus with diabetic autonomic (poly)neuropathy: Secondary | ICD-10-CM | POA: Insufficient documentation

## 2019-08-05 DIAGNOSIS — Z8616 Personal history of COVID-19: Secondary | ICD-10-CM | POA: Insufficient documentation

## 2019-08-05 DIAGNOSIS — Z794 Long term (current) use of insulin: Secondary | ICD-10-CM | POA: Insufficient documentation

## 2019-08-05 DIAGNOSIS — T85598A Other mechanical complication of other gastrointestinal prosthetic devices, implants and grafts, initial encounter: Secondary | ICD-10-CM

## 2019-08-05 DIAGNOSIS — K9423 Gastrostomy malfunction: Secondary | ICD-10-CM | POA: Diagnosis not present

## 2019-08-05 DIAGNOSIS — K3184 Gastroparesis: Secondary | ICD-10-CM | POA: Insufficient documentation
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
## 2019-08-05 NOTE — ED Notes (Signed)
[facility] called for transport to [facility]. Papers at nurse's station. [employer] notified of patient's return.

## 2019-08-05 NOTE — Discharge Instructions (Signed)
Make sure that the feeding tube is flushed after each feeding.  Return here as needed.

## 2019-08-05 NOTE — ED Notes (Signed)
Thayer Ohm PA was successful at getting PEG unclogged.

## 2019-08-05 NOTE — ED Triage Notes (Signed)
Per [facility] pt from [place] for clogged PEG tube. Vitals: 134/78, 60, 96% on RA, 98.1, CBG 161.

## 2019-08-05 NOTE — ED Notes (Signed)
Thayer Ohm PA and Lela RN at bedside working on Peg tube.

## 2019-08-06 DIAGNOSIS — T8131XA Disruption of external operation (surgical) wound, not elsewhere classified, initial encounter: Secondary | ICD-10-CM | POA: Diagnosis not present

## 2019-08-06 DIAGNOSIS — E1169 Type 2 diabetes mellitus with other specified complication: Secondary | ICD-10-CM | POA: Diagnosis not present

## 2019-08-06 DIAGNOSIS — R1310 Dysphagia, unspecified: Secondary | ICD-10-CM | POA: Diagnosis not present

## 2019-08-06 DIAGNOSIS — R5381 Other malaise: Secondary | ICD-10-CM | POA: Diagnosis not present

## 2019-08-08 DIAGNOSIS — J449 Chronic obstructive pulmonary disease, unspecified: Secondary | ICD-10-CM | POA: Diagnosis not present

## 2019-08-08 DIAGNOSIS — I4891 Unspecified atrial fibrillation: Secondary | ICD-10-CM | POA: Diagnosis not present

## 2019-08-08 DIAGNOSIS — I509 Heart failure, unspecified: Secondary | ICD-10-CM | POA: Diagnosis not present

## 2019-08-08 DIAGNOSIS — E1169 Type 2 diabetes mellitus with other specified complication: Secondary | ICD-10-CM | POA: Diagnosis not present

## 2019-08-08 IMAGING — CR DG CHEST 1V PORT
1 series · 1 of 1 positions shown · non-contrast
Comparison: None.

CLINICAL DATA: Hypotension

EXAM:
PORTABLE CHEST 1 VIEW

[portable]
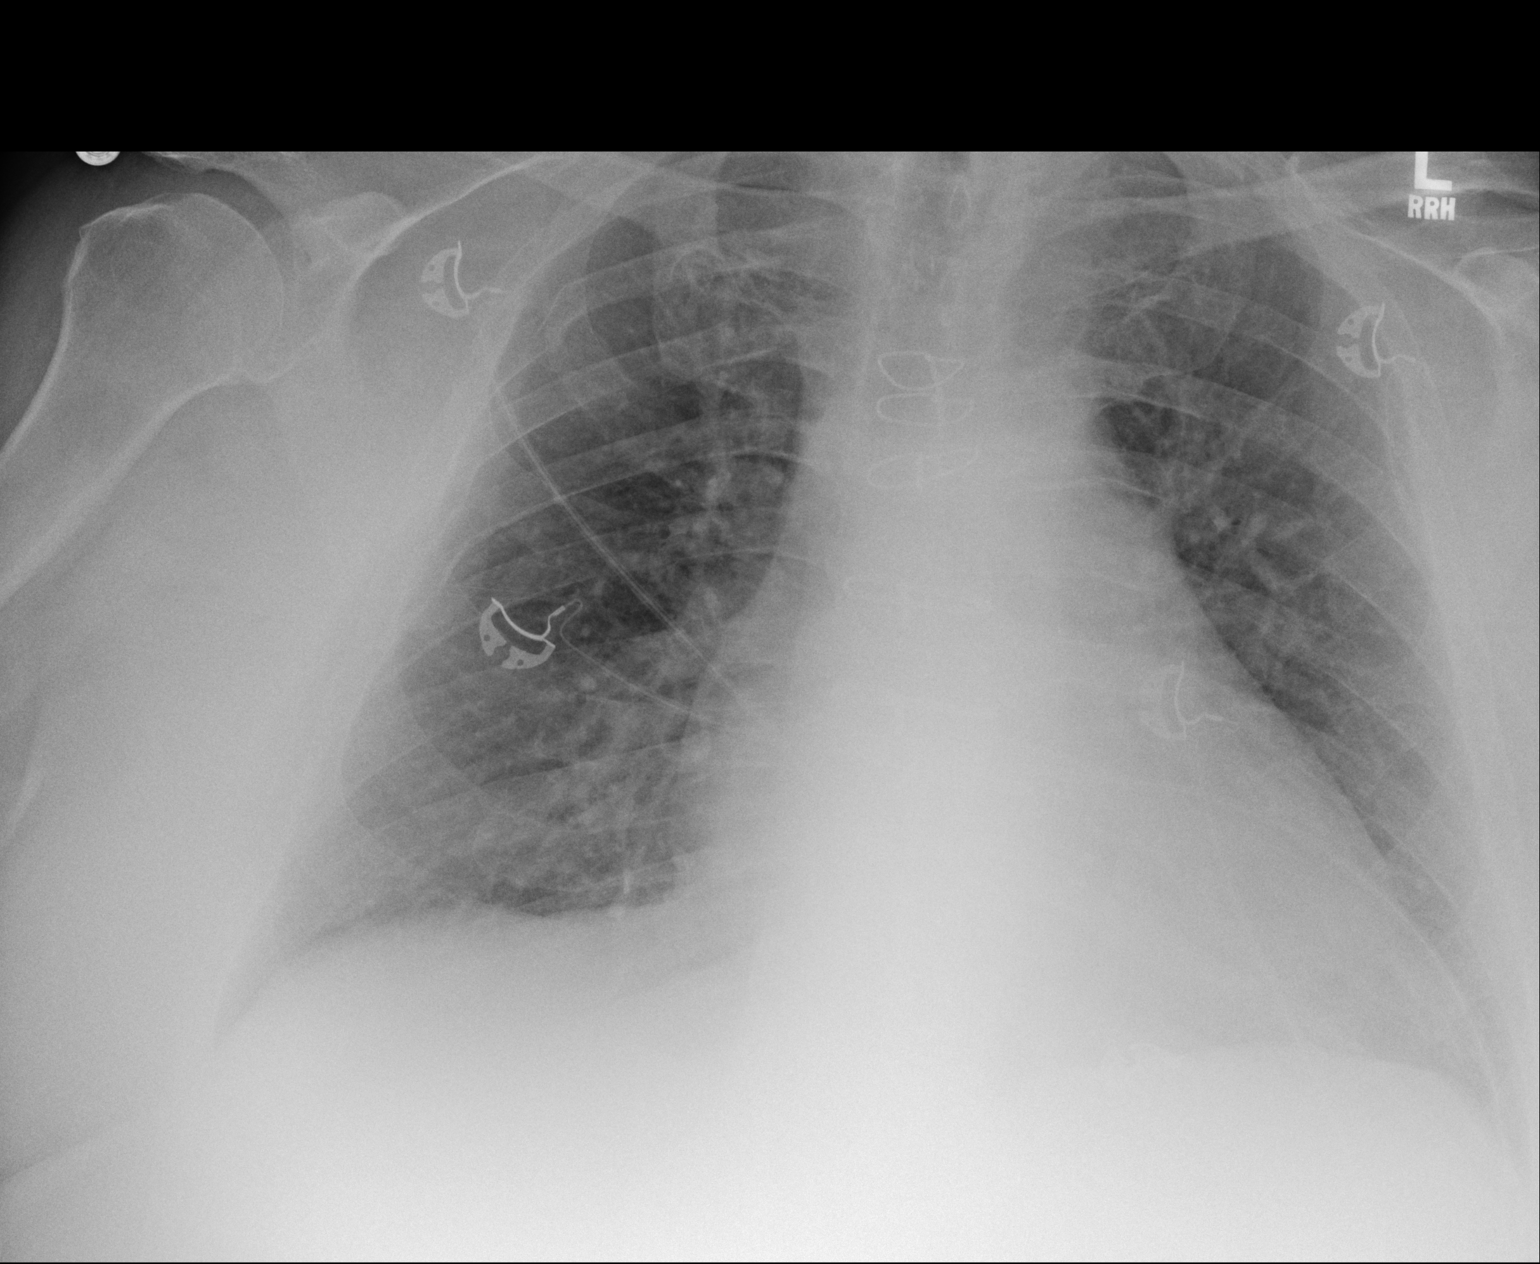

[1 of 1 positions shown; findings below may reference images not displayed]

FINDINGS: There is mild bilateral interstitial thickening. There is no focal
parenchymal opacity. There is no pleural effusion or pneumothorax.
There is stable cardiomegaly. There is evidence of prior median
sternotomy.

The osseous structures are unremarkable.
IMPRESSION: No active disease.  Stable cardiomegaly.

## 2019-08-12 DIAGNOSIS — Z23 Encounter for immunization: Secondary | ICD-10-CM | POA: Diagnosis not present

## 2019-08-14 IMAGING — CR DG CHEST 1V PORT
1 series · 1 of 1 positions shown · non-contrast
Comparison: 02/24/2018.

CLINICAL DATA: Shortness of breath with nausea and dry heaves.
V-tach.

EXAM:
PORTABLE CHEST 1 VIEW

[ap portable]
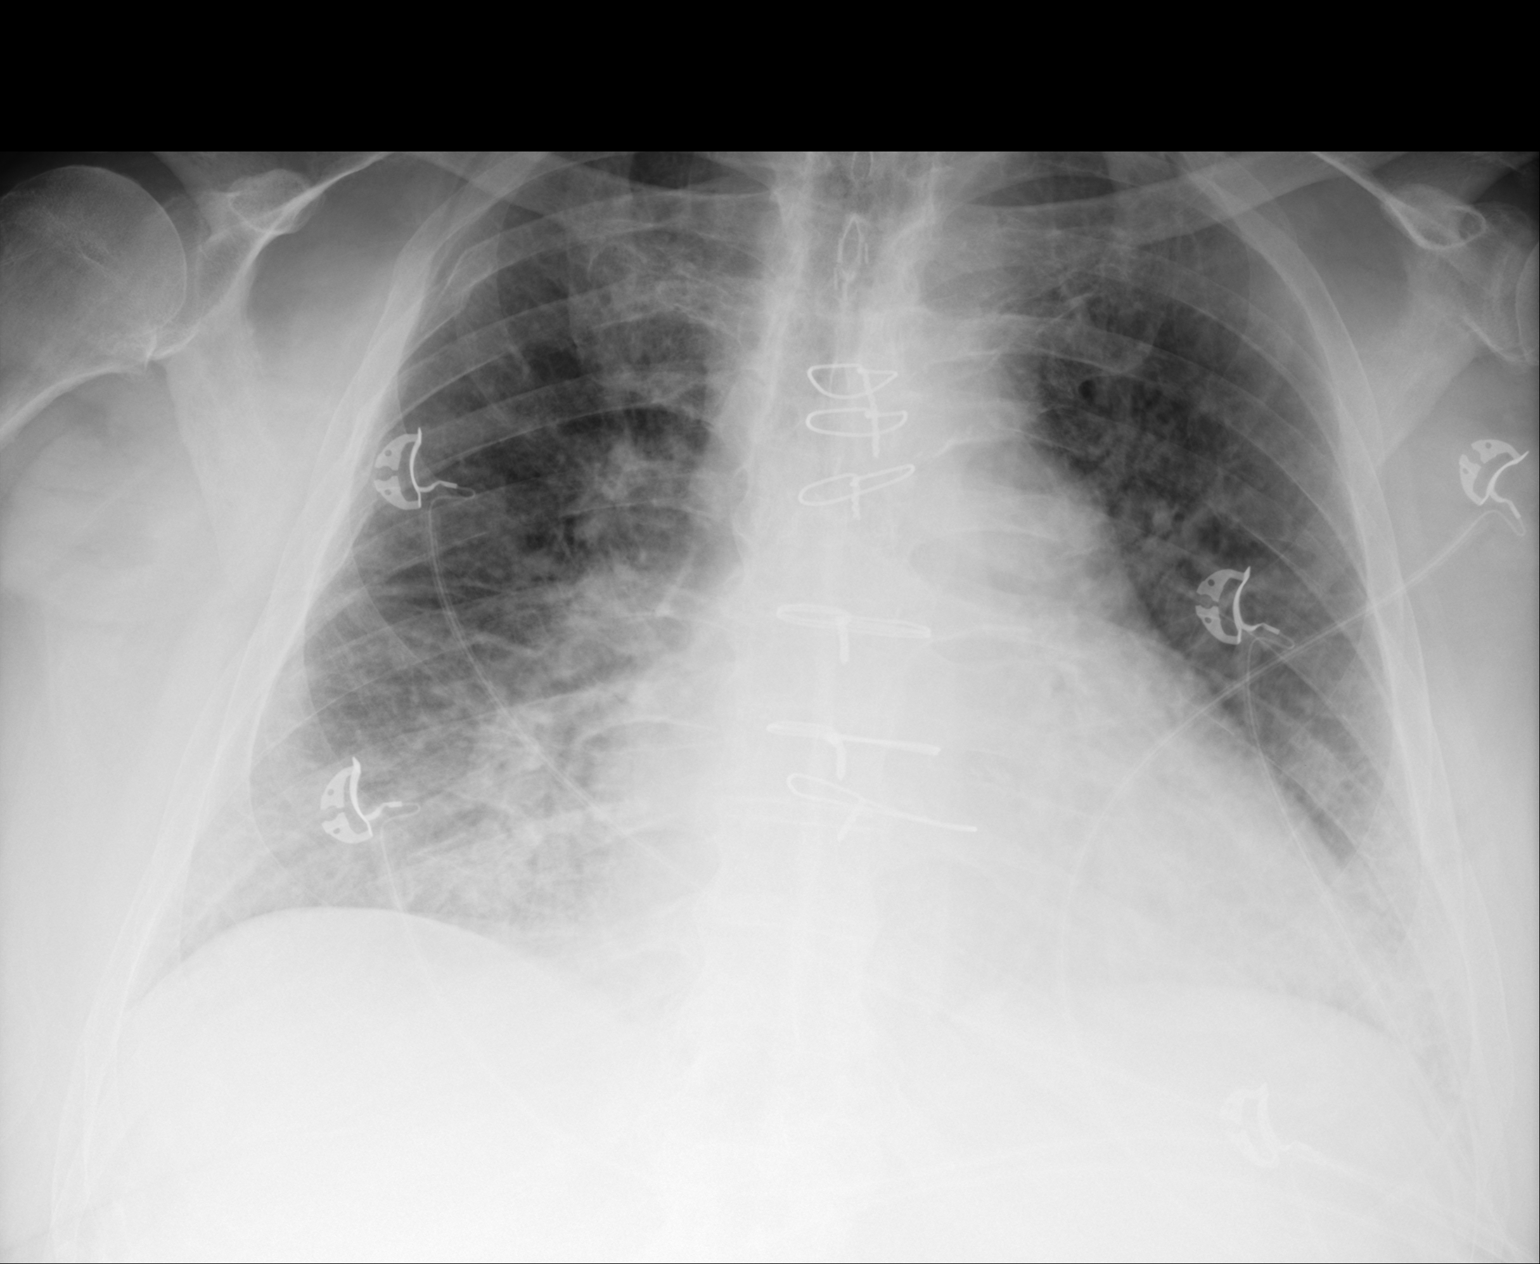

[1 of 1 positions shown; findings below may reference images not displayed]

FINDINGS: Cardiomegaly. Prior CABG. BILATERAL pulmonary opacities most
consistent with pulmonary edema although BILATERAL infiltrates are
not excluded. No pneumothorax. Old rib fractures.
IMPRESSION: Cardiomegaly with BILATERAL pulmonary opacities most consistent with
pulmonary edema.

## 2019-08-15 DIAGNOSIS — I509 Heart failure, unspecified: Secondary | ICD-10-CM | POA: Diagnosis not present

## 2019-08-15 DIAGNOSIS — E1169 Type 2 diabetes mellitus with other specified complication: Secondary | ICD-10-CM | POA: Diagnosis not present

## 2019-08-15 DIAGNOSIS — J449 Chronic obstructive pulmonary disease, unspecified: Secondary | ICD-10-CM | POA: Diagnosis not present

## 2019-08-15 DIAGNOSIS — I4891 Unspecified atrial fibrillation: Secondary | ICD-10-CM | POA: Diagnosis not present

## 2019-08-15 IMAGING — CR DG CHEST 1V PORT SAME DAY
1 series · 1 of 1 positions shown · non-contrast
Comparison: Portable chest x-ray of today's date at [DATE] a.m..

CLINICAL DATA: Post intubation radiograph.

EXAM:
PORTABLE CHEST 1 VIEW

[portable]
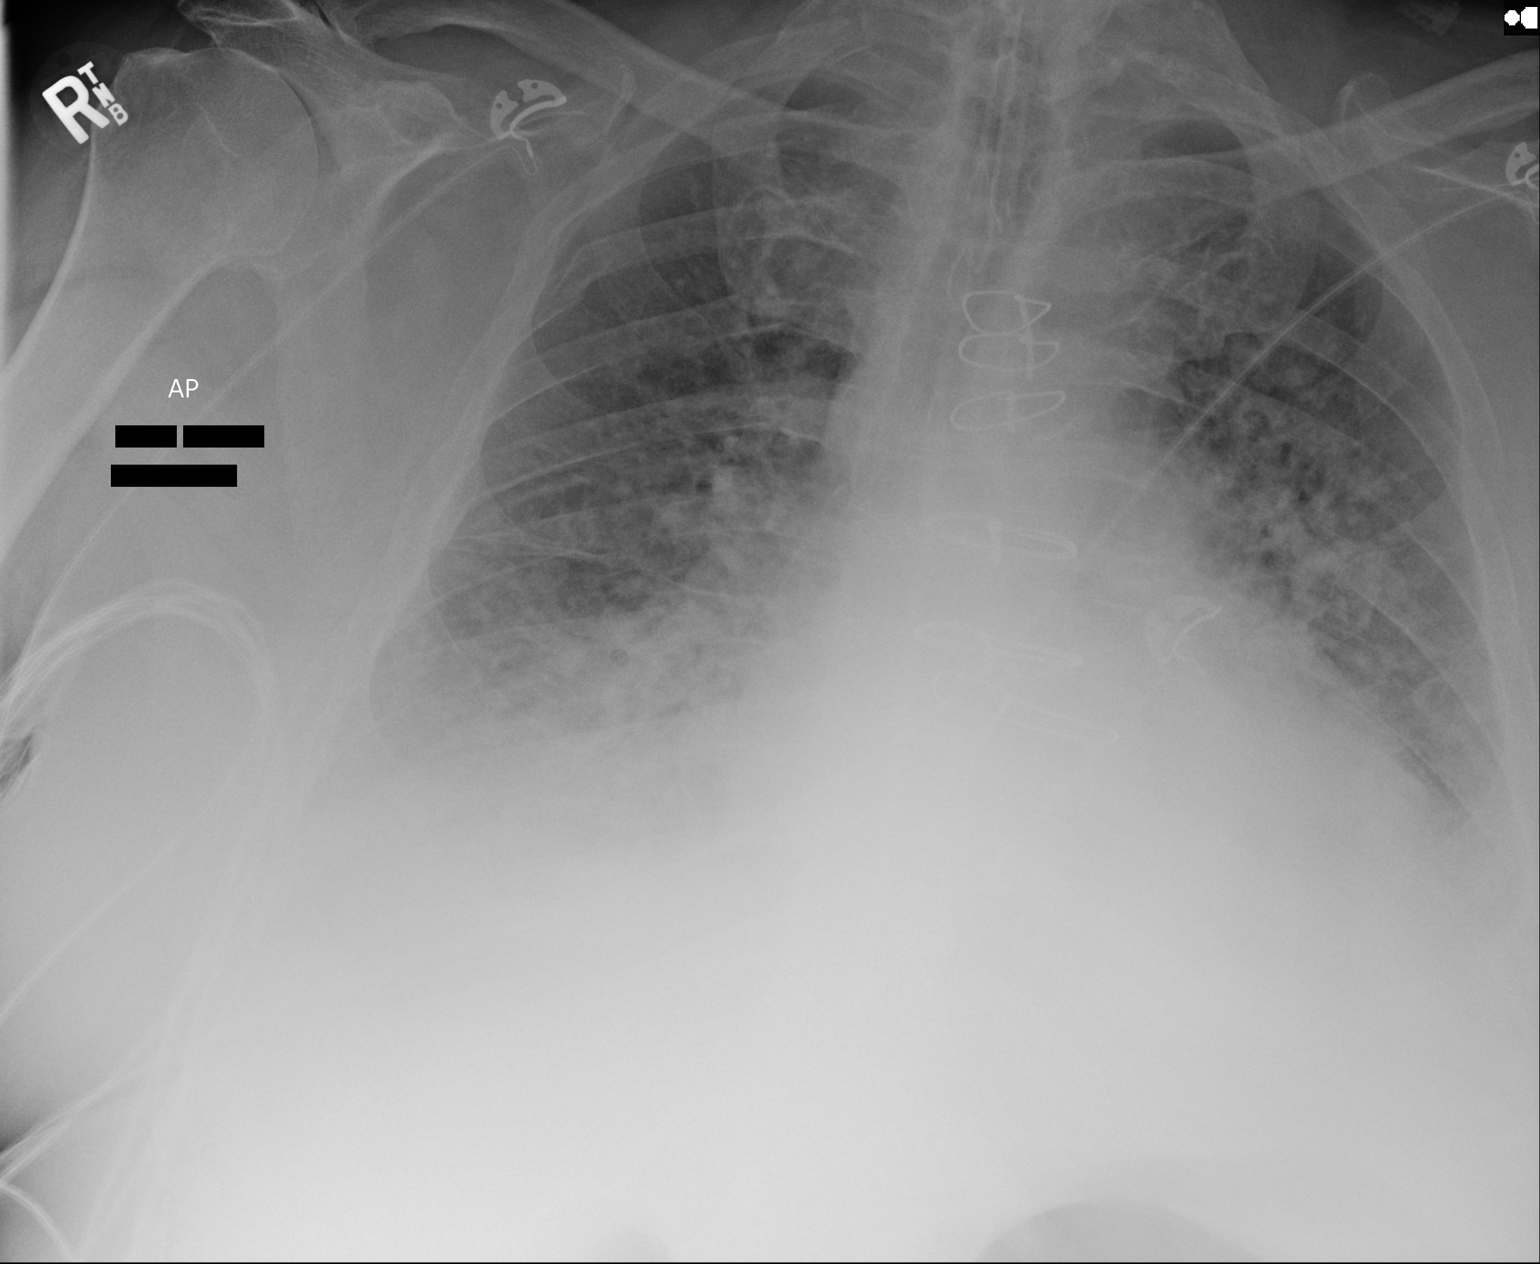

[1 of 1 positions shown; findings below may reference images not displayed]

FINDINGS: The endotracheal tube tip projects approximately 6.5 cm above the
carina. The tip is at the level of the inferior margin of the
clavicular heads. Widespread interstitial densities are present with
confluent densities at the lung bases. Small bilateral pleural
effusions are present. The cardiac silhouette remains enlarged and
the pulmonary vascularity engorged. The visualized sternal wires are
intact.
IMPRESSION: Interval intubation of the trachea. Advancement of the endotracheal
tube by approximately 2-3 cm would be useful.

Widespread interstitial and airspace opacities most compatible with
pulmonary edema.

## 2019-08-15 IMAGING — CR DG CHEST 1V PORT
1 series · 1 of 1 positions shown · non-contrast
Comparison: Chest radiograph dated 03/02/2018

CLINICAL DATA: 67-year-old male with shortness of breath.

EXAM:
PORTABLE CHEST 1 VIEW

[portable]
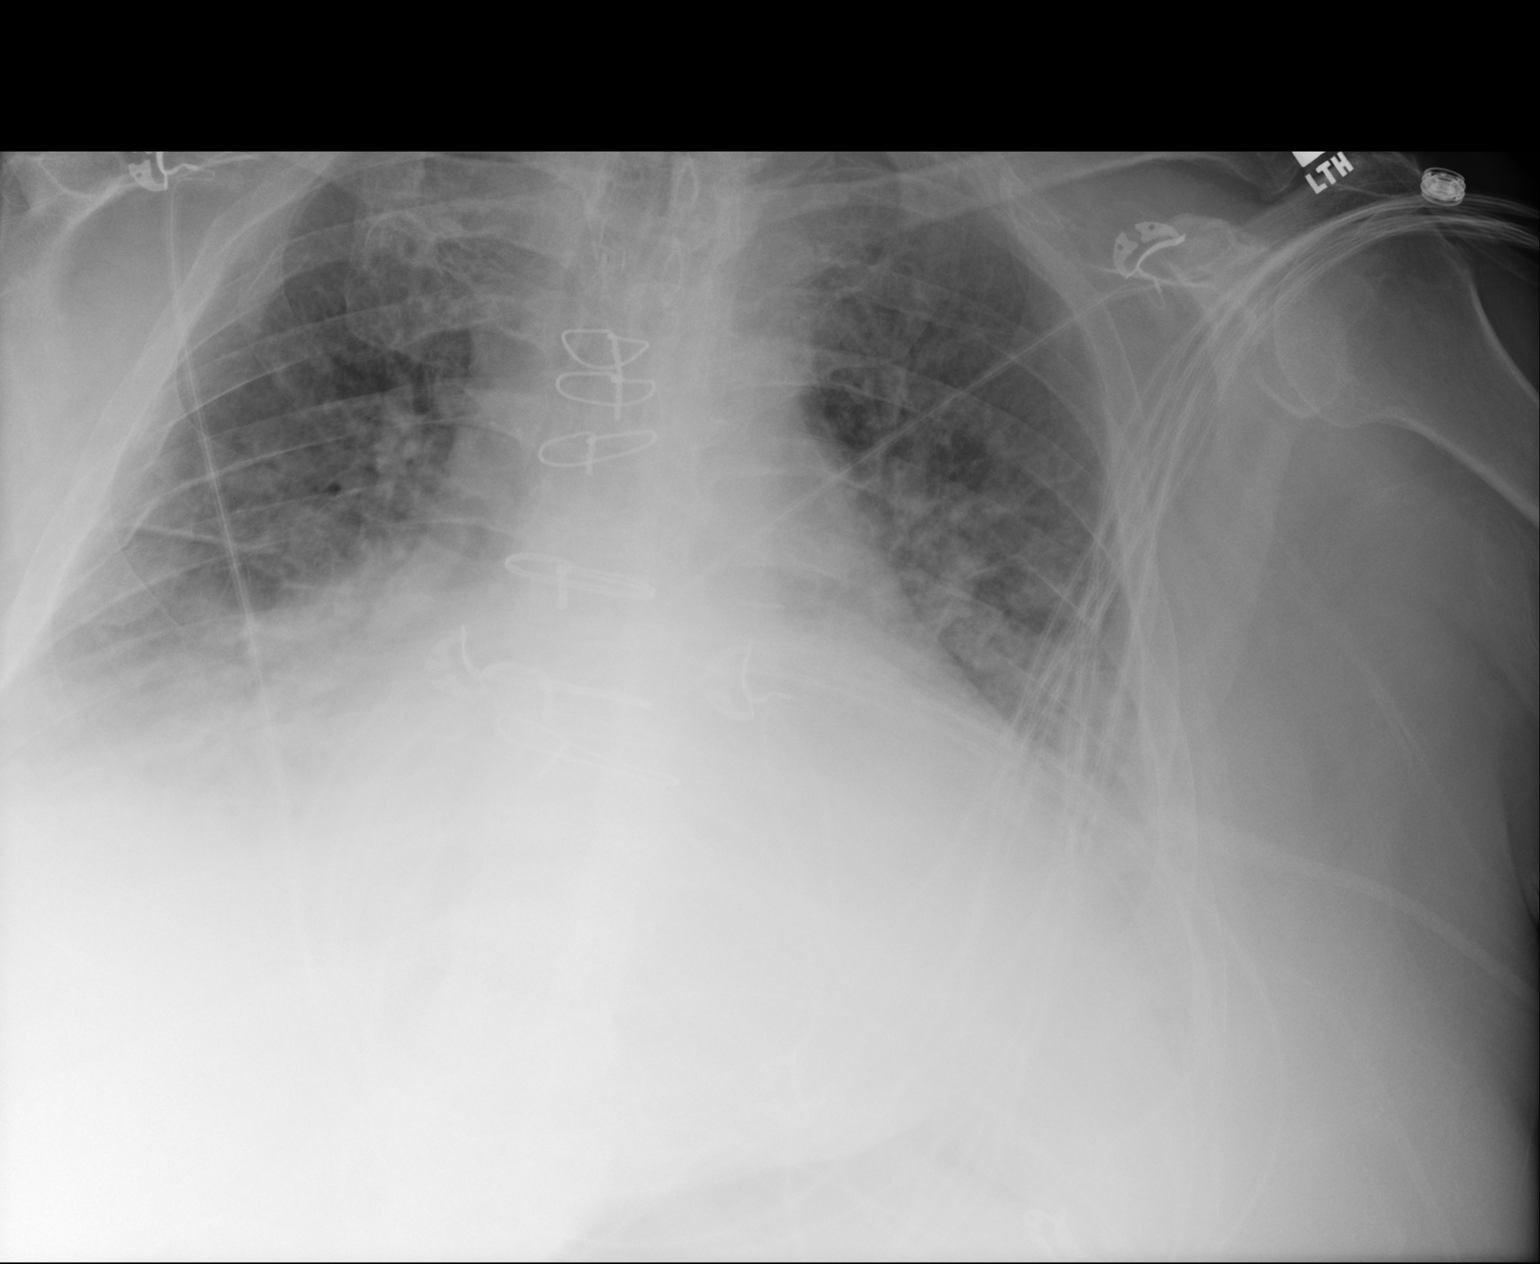

[1 of 1 positions shown; findings below may reference images not displayed]

FINDINGS: There is shallow inspiration with bibasilar atelectasis versus
infiltrate. Probable small bilateral pleural effusions. The right
costophrenic angle has been excluded from the image. Overall there
is poor inspiratory effort compared to the prior radiograph with
increased bibasilar densities. No pneumothorax. Stable cardiomegaly.
Median sternotomy wires. No acute osseous pathology. Old right rib
fractures.
IMPRESSION: Shallow inspiration with bibasilar atelectasis versus infiltrate and
probable small bilateral pleural effusions.

## 2019-08-16 IMAGING — CR DG CHEST 1V PORT
1 series · 1 of 1 positions shown · non-contrast
Comparison: Radiograph March 03, 2018.

CLINICAL DATA: Respiratory failure.

EXAM:
PORTABLE CHEST 1 VIEW

[portable]
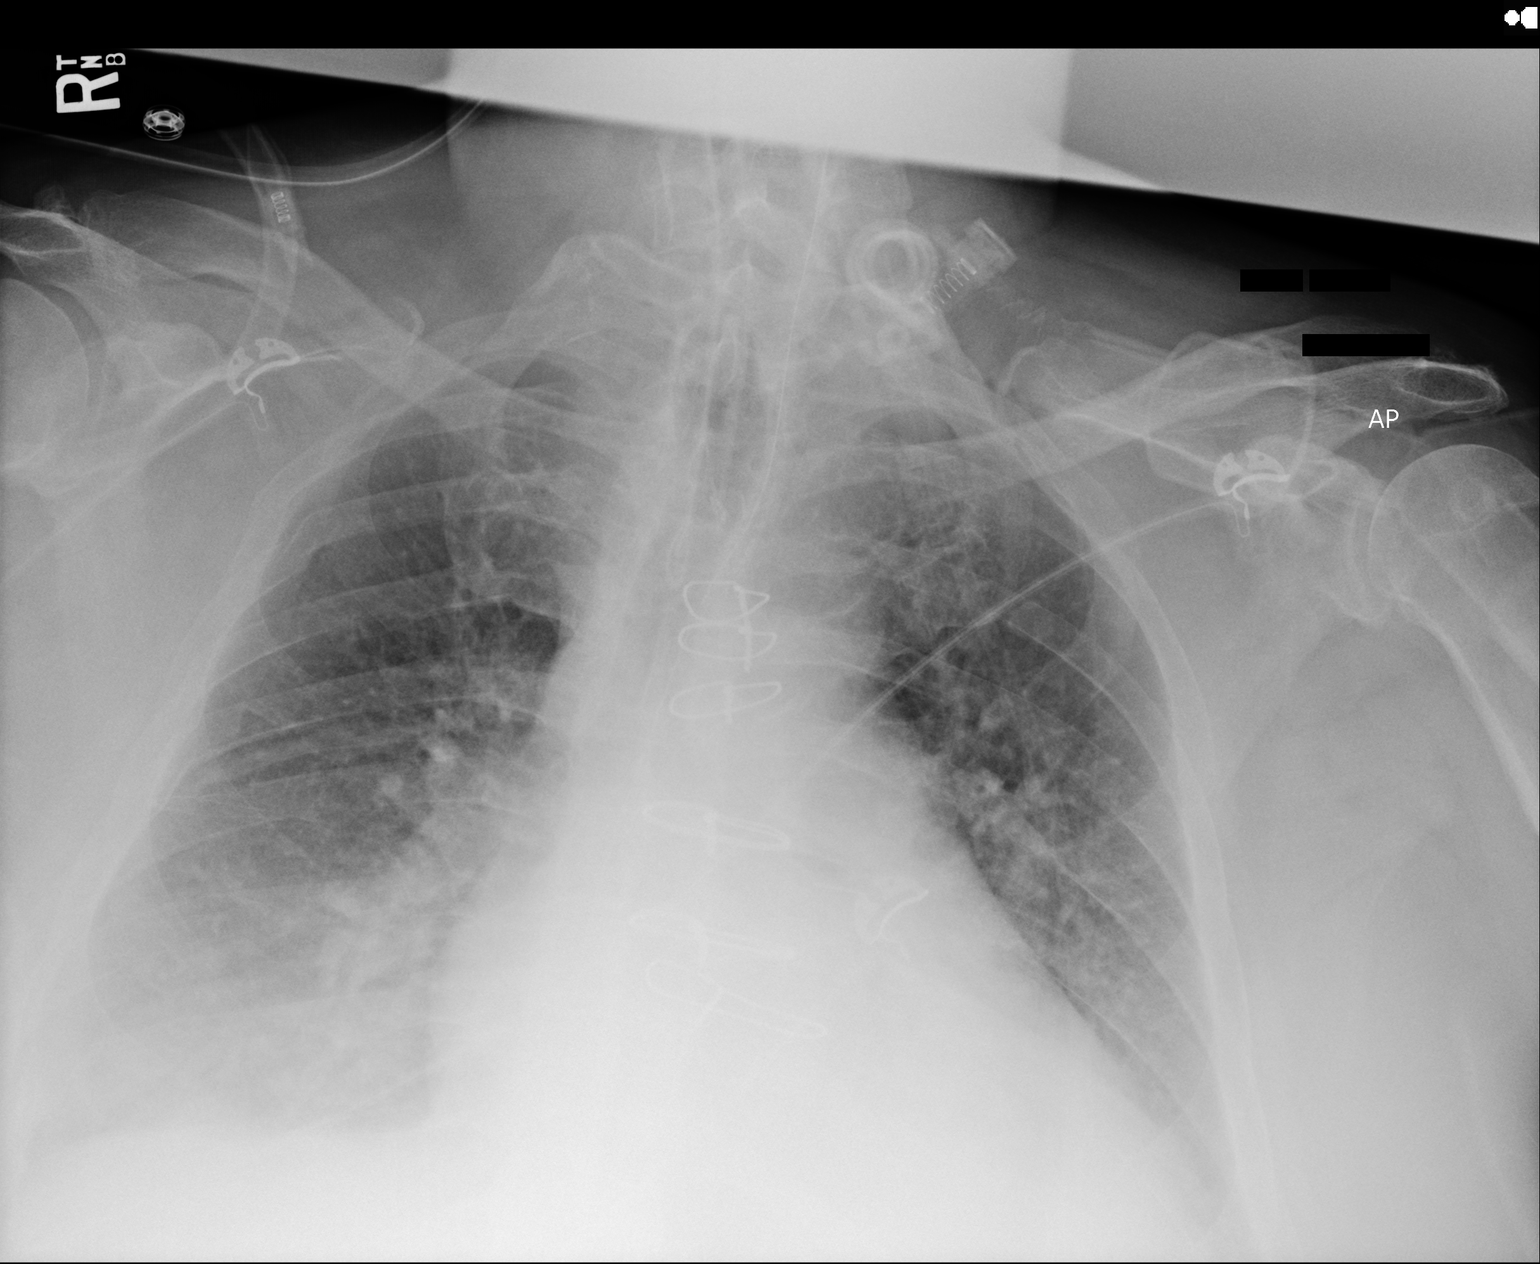

[1 of 1 positions shown; findings below may reference images not displayed]

FINDINGS: Stable cardiomegaly. Endotracheal and nasogastric tubes are
unchanged in position. No pneumothorax is noted. Stable bibasilar
opacities are noted concerning for edema or infiltrates. Small
bilateral pleural effusions cannot be excluded. Bony thorax is
unremarkable.
IMPRESSION: Stable support apparatus. Stable bibasilar opacities as described
above.

## 2019-08-16 IMAGING — CR DG CHEST 1V PORT SAME DAY
1 series · 1 of 1 positions shown · non-contrast
Comparison: Portable chest x-ray December 02, 2017

CLINICAL DATA: Respiratory distress, atrial fibrillation, CHF,
COPD, former smoker.

EXAM:
PORTABLE CHEST 1 VIEW

[portable]
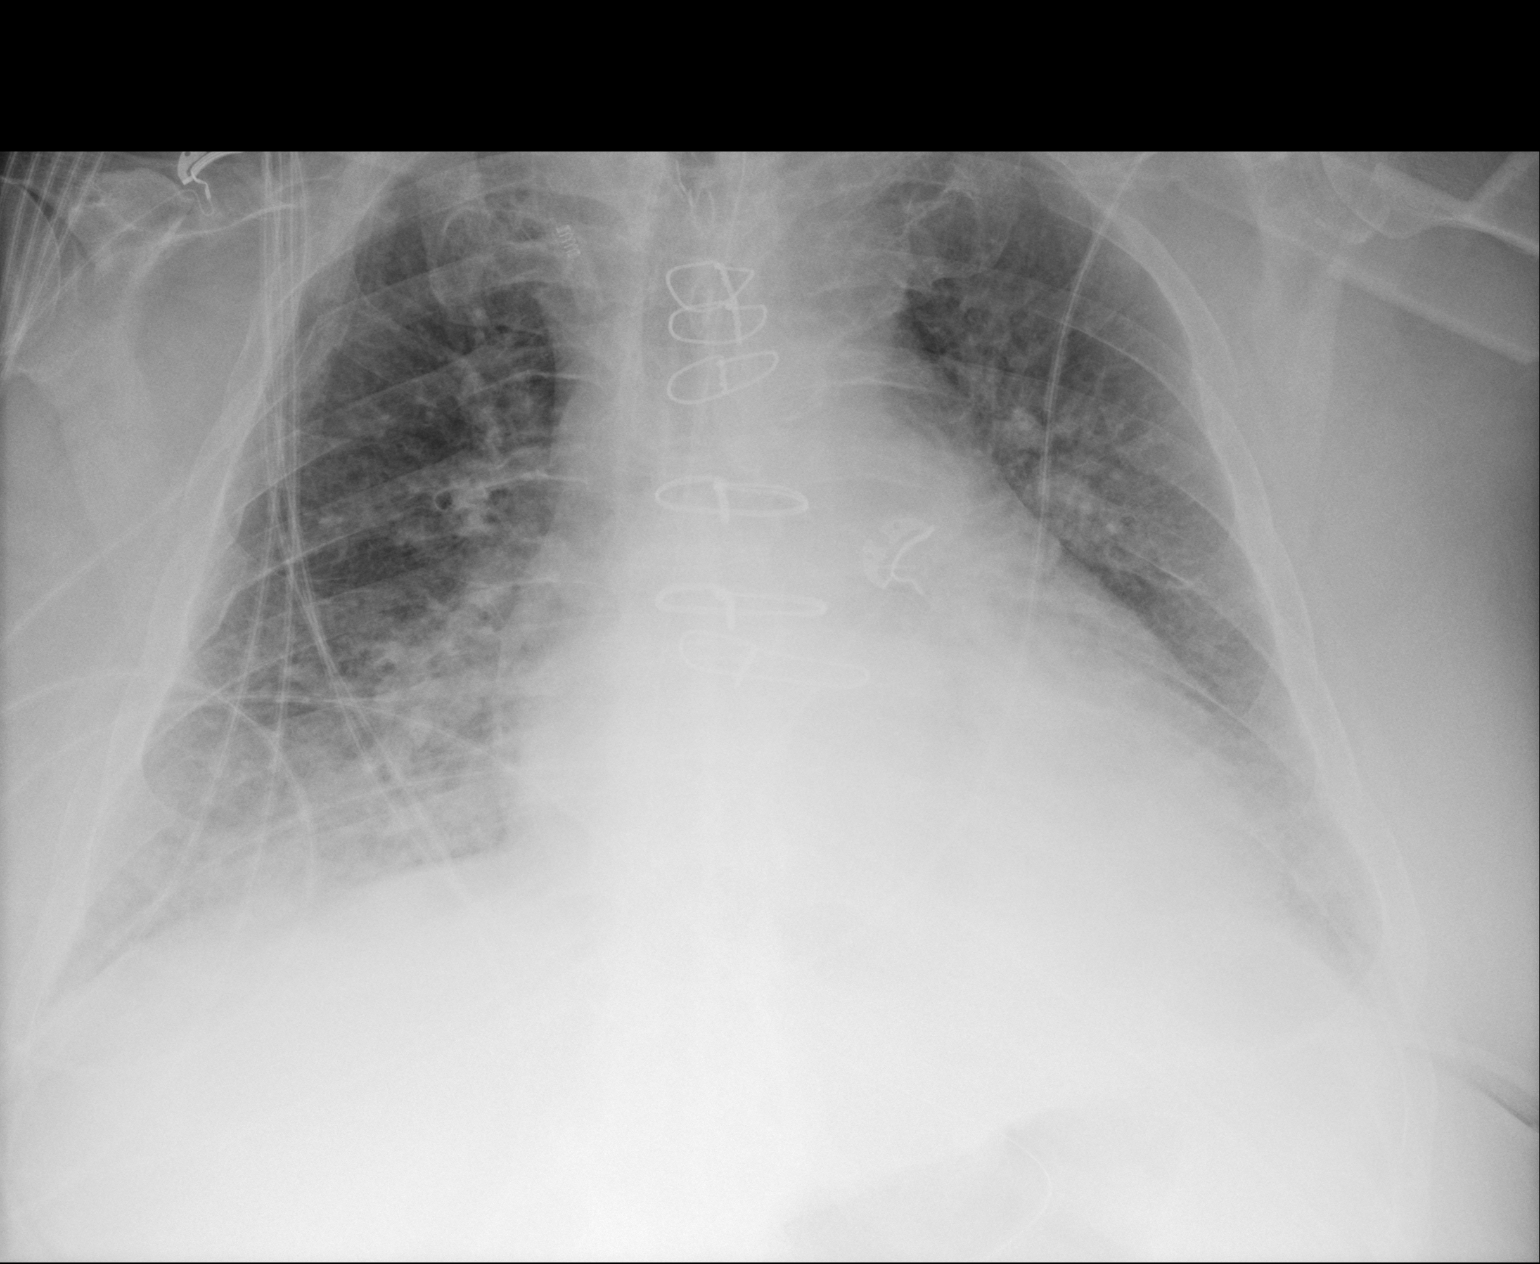

[1 of 1 positions shown; findings below may reference images not displayed]

FINDINGS: The lungs are adequately inflated. The interstitial markings are
increased bilaterally. The retrocardiac region on the left and the
right infrahilar regions remain dense. The cardiac silhouette is
enlarged and the pulmonary vascularity engorged. The sternal wires
are intact. There is calcification in the wall of the aortic arch. A
nasogastric tube is present whose tip projects below the inferior
margin of the image.
IMPRESSION: CHF with pulmonary interstitial and alveolar edema. Bibasilar
atelectasis. One cannot exclude bibasilar pneumonia in the
appropriate clinical setting.

Thoracic aortic atherosclerosis.

## 2019-08-26 DIAGNOSIS — I4891 Unspecified atrial fibrillation: Secondary | ICD-10-CM | POA: Diagnosis not present

## 2019-08-26 DIAGNOSIS — E1169 Type 2 diabetes mellitus with other specified complication: Secondary | ICD-10-CM | POA: Diagnosis not present

## 2019-08-26 DIAGNOSIS — R1310 Dysphagia, unspecified: Secondary | ICD-10-CM | POA: Diagnosis not present

## 2019-08-26 DIAGNOSIS — J449 Chronic obstructive pulmonary disease, unspecified: Secondary | ICD-10-CM | POA: Diagnosis not present

## 2019-09-03 DIAGNOSIS — I509 Heart failure, unspecified: Secondary | ICD-10-CM | POA: Diagnosis not present

## 2019-09-03 DIAGNOSIS — B351 Tinea unguium: Secondary | ICD-10-CM | POA: Diagnosis not present

## 2019-09-03 DIAGNOSIS — I4891 Unspecified atrial fibrillation: Secondary | ICD-10-CM | POA: Diagnosis not present

## 2019-09-03 DIAGNOSIS — T8131XA Disruption of external operation (surgical) wound, not elsewhere classified, initial encounter: Secondary | ICD-10-CM | POA: Diagnosis not present

## 2019-09-03 DIAGNOSIS — J449 Chronic obstructive pulmonary disease, unspecified: Secondary | ICD-10-CM | POA: Diagnosis not present

## 2019-09-10 DIAGNOSIS — F4321 Adjustment disorder with depressed mood: Secondary | ICD-10-CM | POA: Diagnosis not present

## 2019-09-11 IMAGING — CT CT ABD-PELV W/O CM
2 of 3 series · 16 of 46 positions shown, 18 images · non-contrast
Comparison: 05/20/2016 CT.

CLINICAL DATA: 67-year-old male with nausea, vomiting and diarrhea
with 20 pound weight loss in the past 2-3 months. Hypotensive.
Initial encounter.

EXAM:
CT ABDOMEN AND PELVIS WITHOUT CONTRAST
TECHNIQUE: Multidetector CT imaging of the abdomen and pelvis was performed
following the standard protocol without IV contrast.

[Series 2: axial st · axial · 0.79mm/px · z∈[-726,-321]mm · 13 of 93 slices shown, 15 images]
[im 6/93  soft-tissue]
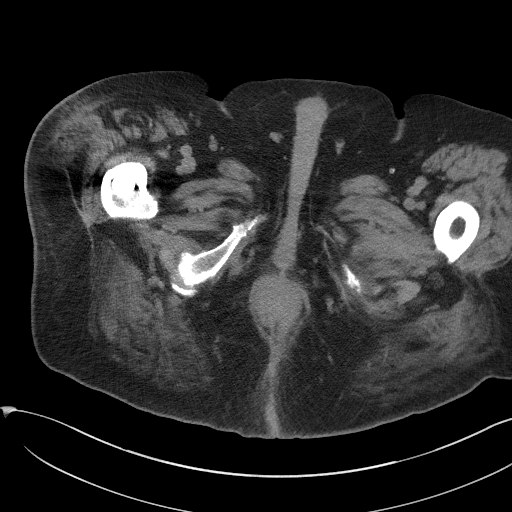
[im 6/93  bone]
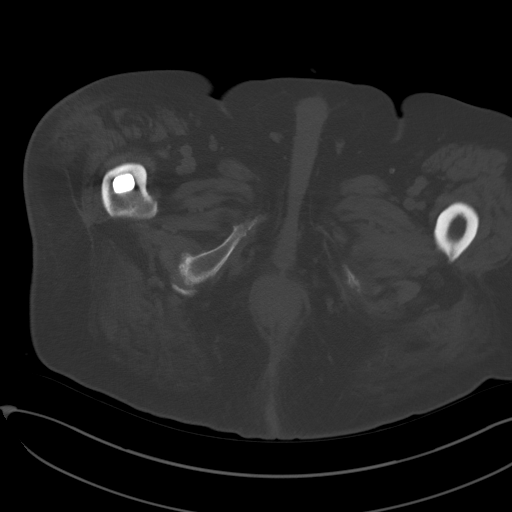
[im 12/93  soft-tissue]
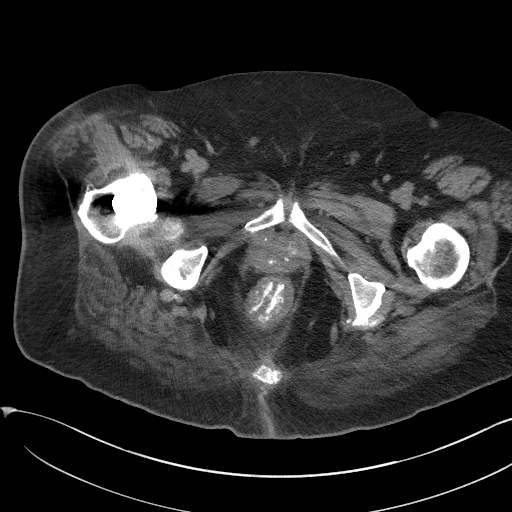
[im 18/93  soft-tissue]
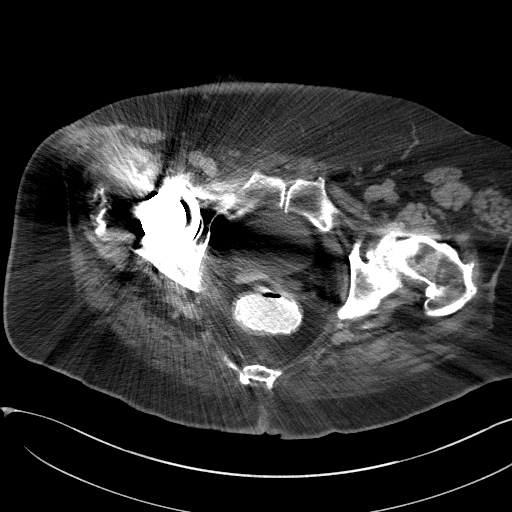
[im 27/93  soft-tissue]
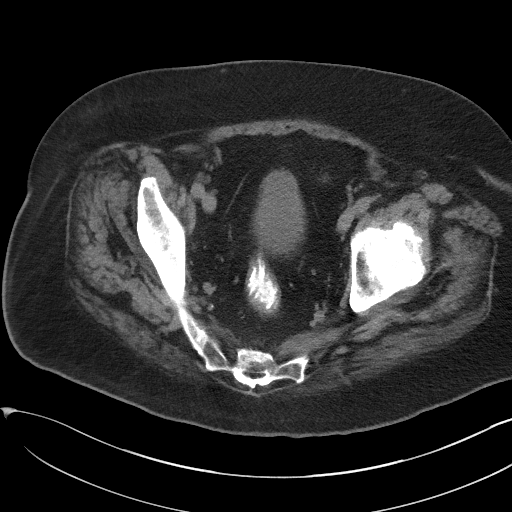
[im 33/93  soft-tissue]
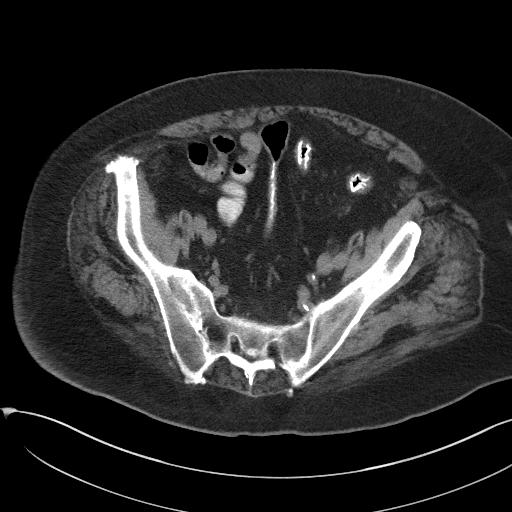
[im 39/93  soft-tissue]
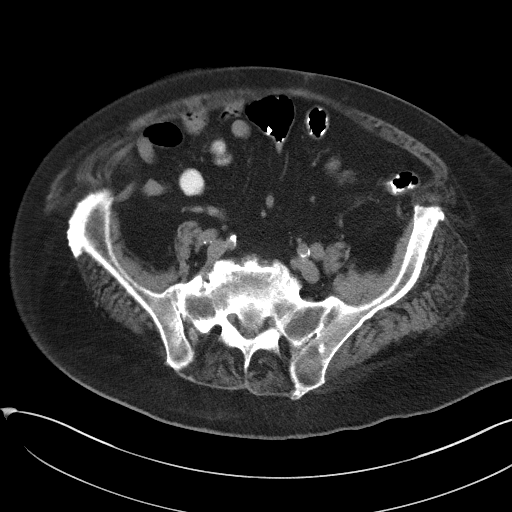
[im 48/93  soft-tissue]
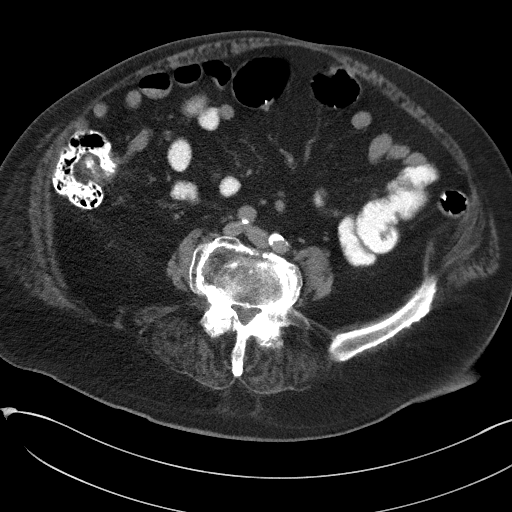
[im 54/93  soft-tissue]
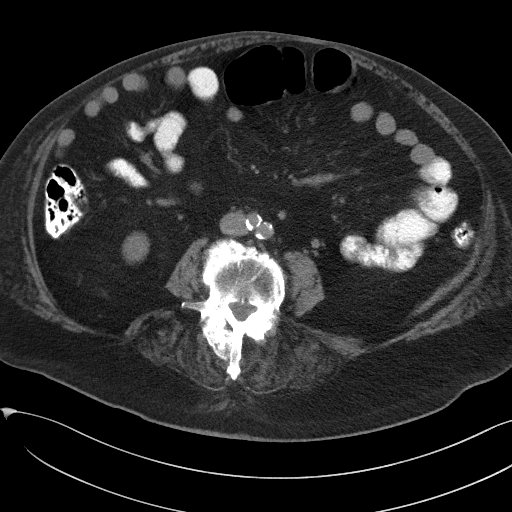
[im 60/93  soft-tissue]
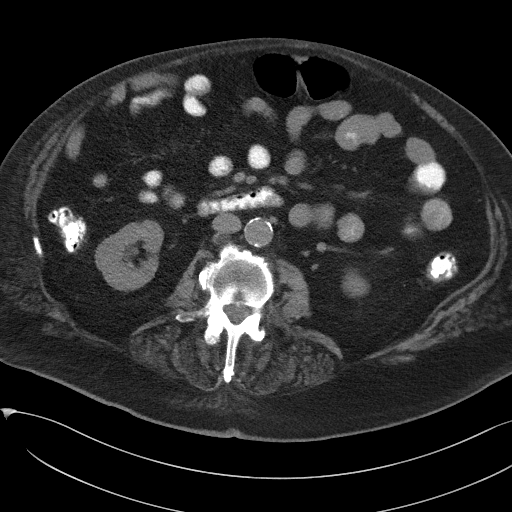
[im 60/93  bone]
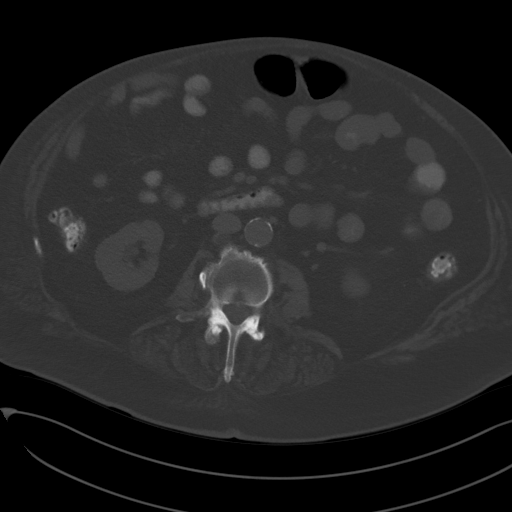
[im 66/93  soft-tissue]
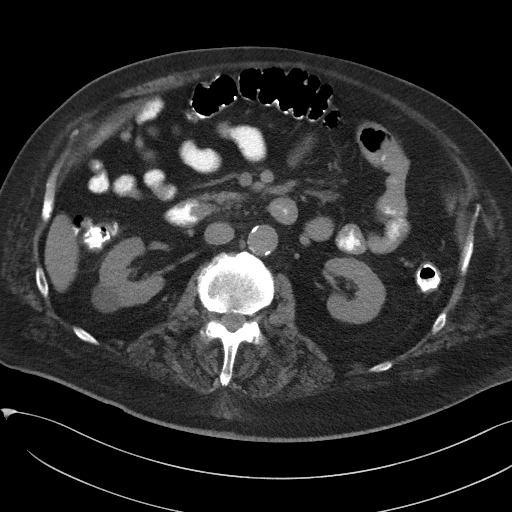
[im 75/93  soft-tissue]
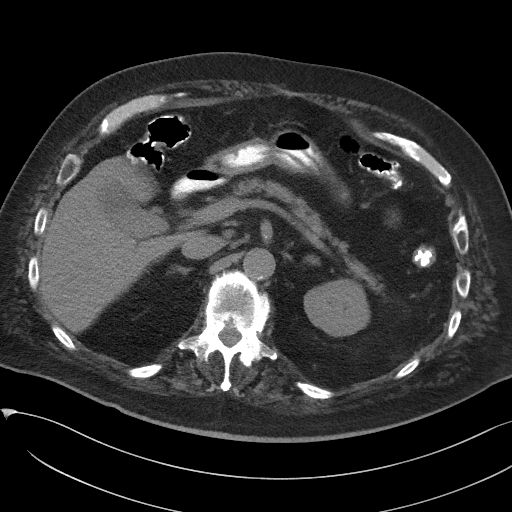
[im 81/93  soft-tissue]
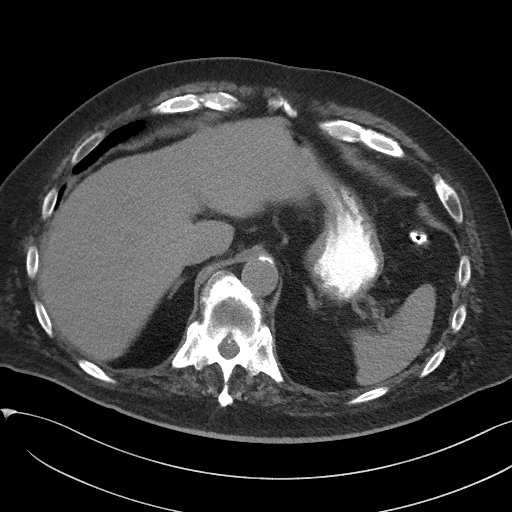
[im 87/93  soft-tissue]
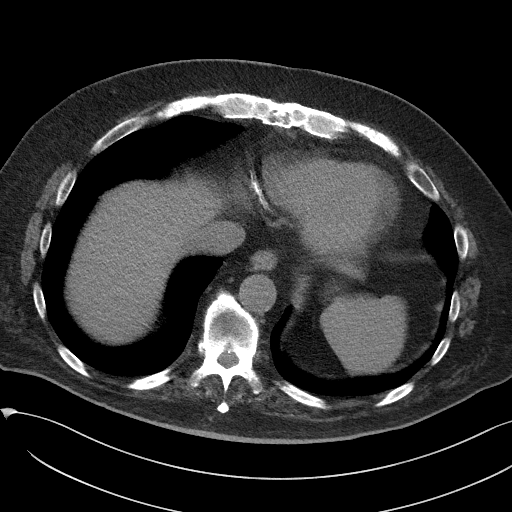

[Series 5: coronal st · coronal · 0.90mm/px · 3 of 109 slices shown]
[im 37/109  soft-tissue]
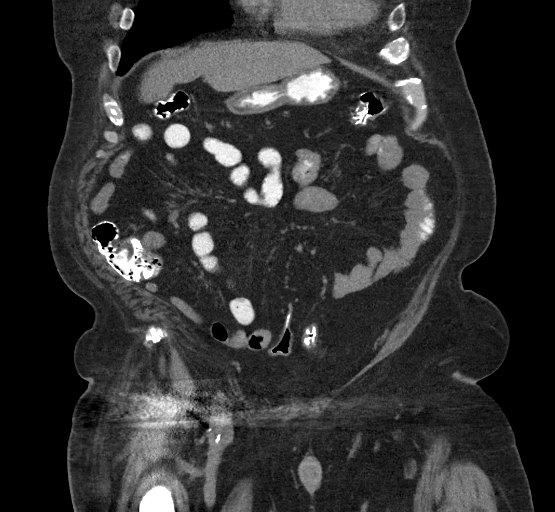
[im 49/109  soft-tissue]
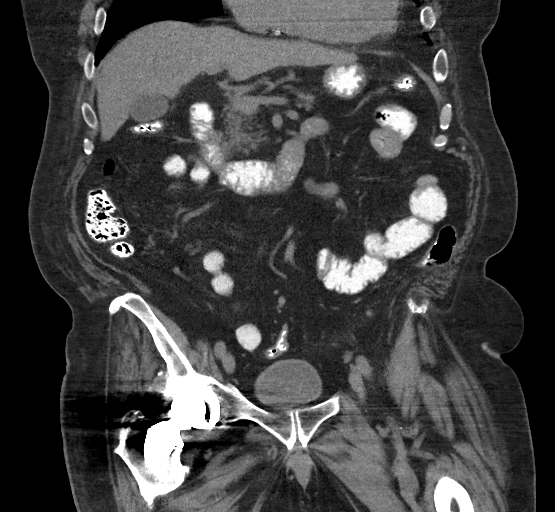
[im 61/109  soft-tissue]
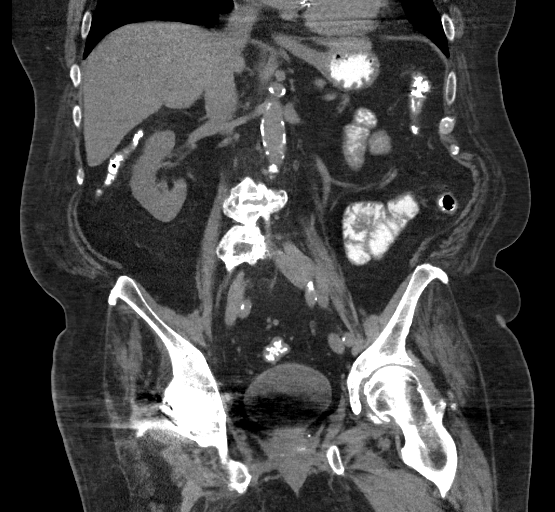

[16 of 46 positions shown; findings below may reference images not displayed]

FINDINGS: Lower chest: No worrisome lung base abnormality. Heart size
top-normal. Mitral valve and coronary artery calcifications.

Hepatobiliary: Taking into account limitation by non contrast
imaging, no worrisome hepatic lesion. No calcified gallstone or CT
evidence of inflammation. Gallbladder sludge may be present.

Pancreas: At the pancreatic head duodenal interface, 2.2 cm nodular
structure (series 5, images 51 through 53) is noted. It is possible
this represents a prominent ampulla as there is no evidence of
pancreatic duct or common bile duct dilation however, mass at this
level cannot be excluded. Pancreas otherwise unremarkable.

Spleen: Taking into account limitation by non contrast imaging, no
splenic mass or enlargement.

Adrenals/Urinary Tract: No obstructing renal/ureteral stone or
hydronephrosis. Left lower pole 4 and 2 mm nonobstructing stone.
Right renal 2.2 cm upper pole cyst. No adrenal mass. Evaluation of
urinary bladder limited by streak artifact from right hip
replacement. No gross abnormality noted.

Stomach/Bowel: No extraluminal bowel inflammatory process noted.
Portions of bowel/stomach are under distended limiting evaluation
for detection of a mass.

Vascular/Lymphatic: Atherosclerotic changes aorta and aortic branch
vessels. No abdominal aortic aneurysm.

Scattered normal size lymph nodes.

Reproductive: No gross abnormality.

Other: No free air or bowel containing hernia.

Musculoskeletal: Prior right hip replacement. Left hip joint
degenerative changes. Fusion sacroiliac joints with confluent
osteophyte and fusion lower thoracic and lumbar spine.
IMPRESSION: 1. No extraluminal bowel inflammatory process.
2. At the pancreatic head duodenal interface, 2.2 cm nodular
structure (series 5, images 51 through 53) is noted. It is possible
this represents a prominent ampulla as there is no evidence of
pancreatic duct or common bile duct dilation however, mass at this
level cannot be excluded.
3. Nonobstructing left renal calculi.
4. Aortic Atherosclerosis ([id number]). Coronary artery
calcification.
5. Fusion sacroiliac joints, lumbar spine and thoracic spine raises
possibility of ankylosing spondylitis.

## 2019-09-17 DIAGNOSIS — F4321 Adjustment disorder with depressed mood: Secondary | ICD-10-CM | POA: Diagnosis not present

## 2019-09-19 IMAGING — MR MR ABDOMEN WO/W CM
6 of 22 series · 17 of 48 positions shown · IV contrast (10 gadavist)
Comparison: Noncontrast CT on 03/30/2018

CLINICAL DATA: Possible ampullary soft tissue mass on recent
noncontrast CT.

EXAM:
MRI ABDOMEN WITHOUT AND WITH CONTRAST (INCLUDING MRCP)
TECHNIQUE: Multiplanar multisequence MR imaging of the abdomen was performed
both before and after the administration of intravenous contrast.
Heavily T2-weighted images of the biliary and pancreatic ducts were
obtained, and three-dimensional MRCP images were rendered by post
processing.
CONTRAST:  10 mL Gadavist

[Series 4: T2 · coronal · 5.0mm · 1.37mm/px · 2 of 43 slices shown (1 of 2)]
[im 1/43]
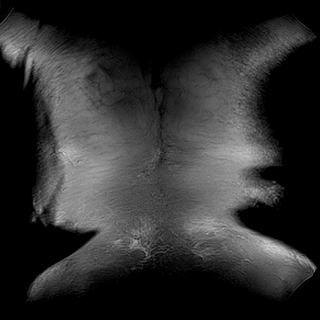
[im 43/43]
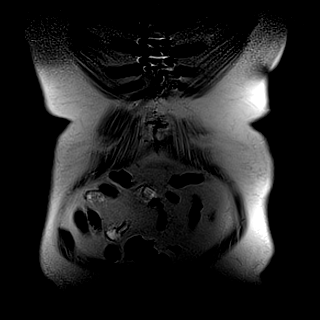

[Series 5: t2fs axial blade · axial · 4.0mm · 1.25mm/px · z∈[-82,+143]mm · 3 of 48 slices shown]
[im 1/48]
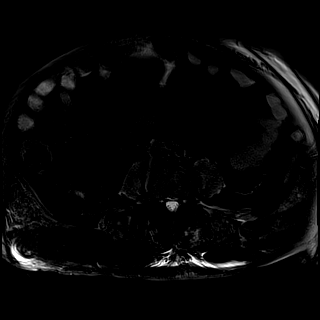
[im 24/48]
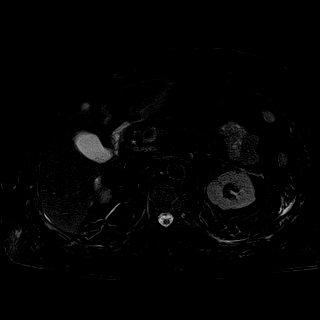
[im 48/48]
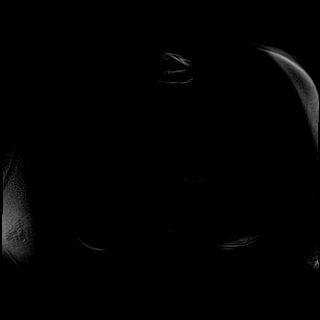

[Series 6: T2 · coronal · 1.0mm · 0.42mm/px · 4 of 104 slices shown (2 of 2)]
[im 1/104]
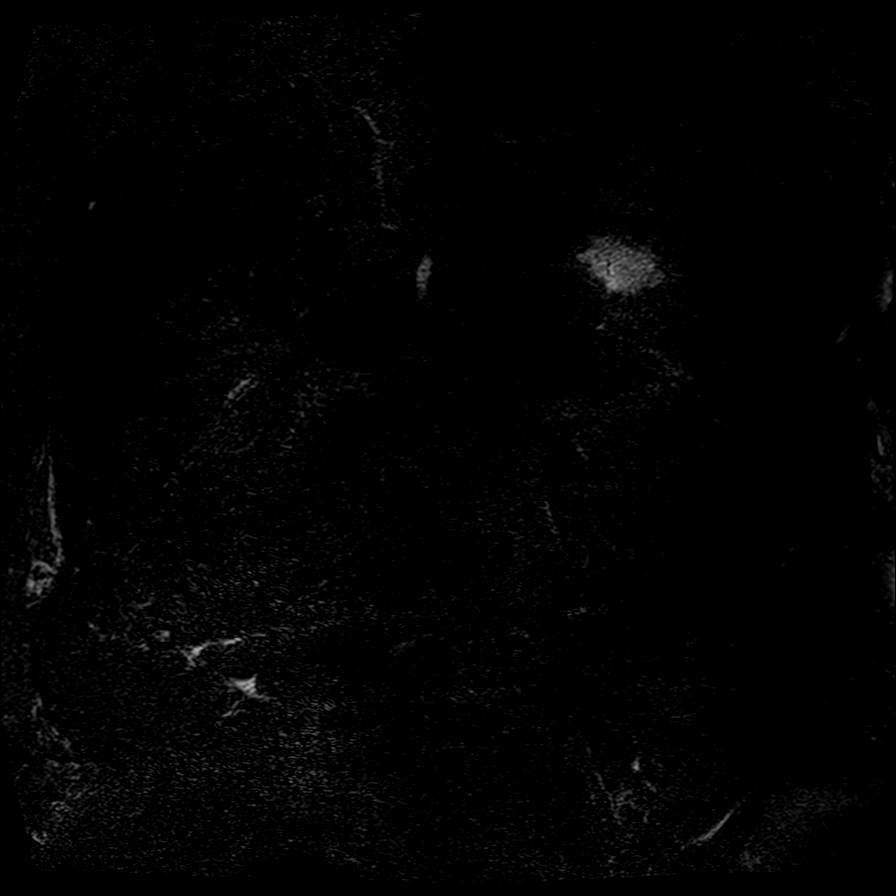
[im 35/104]
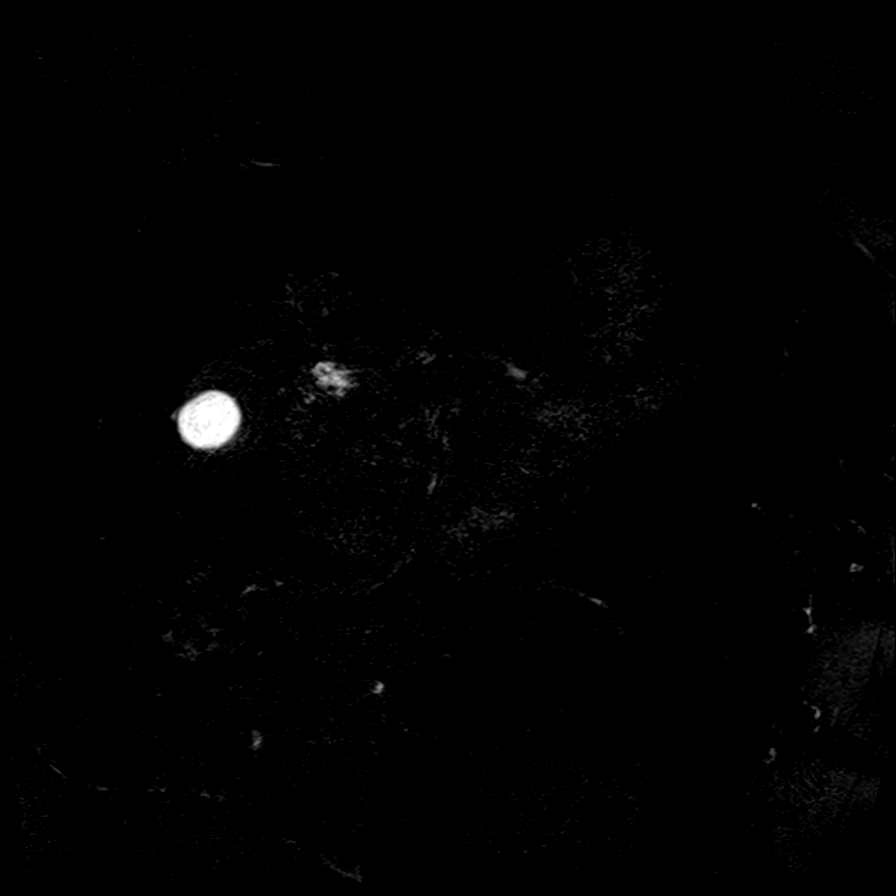
[im 69/104]
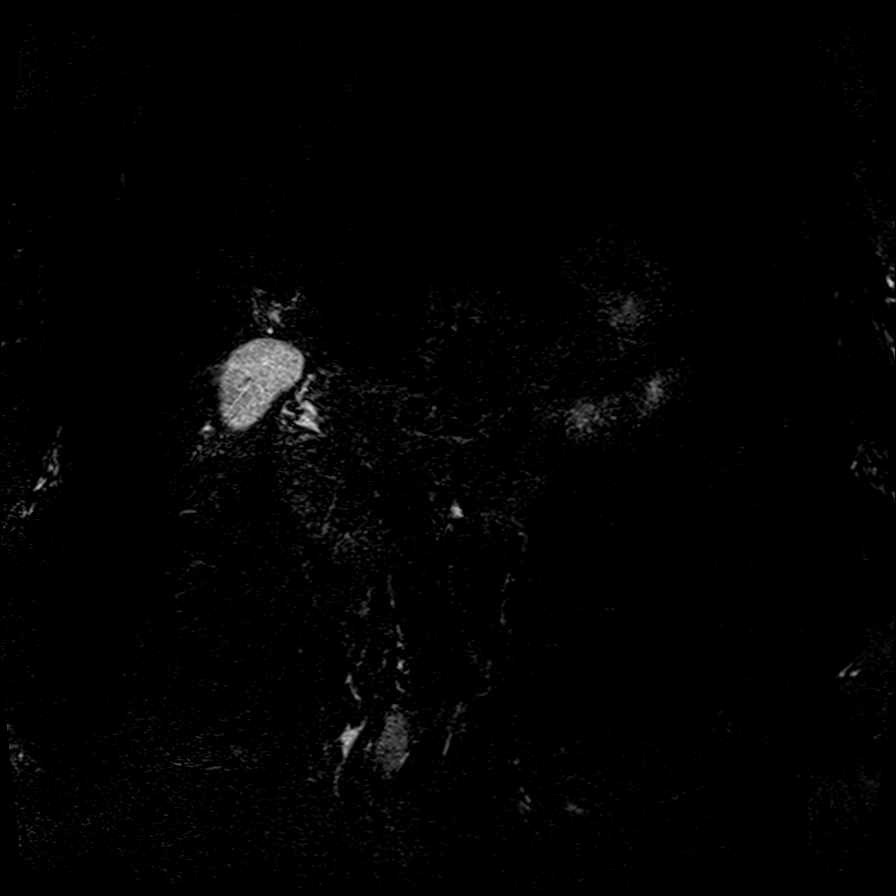
[im 104/104]
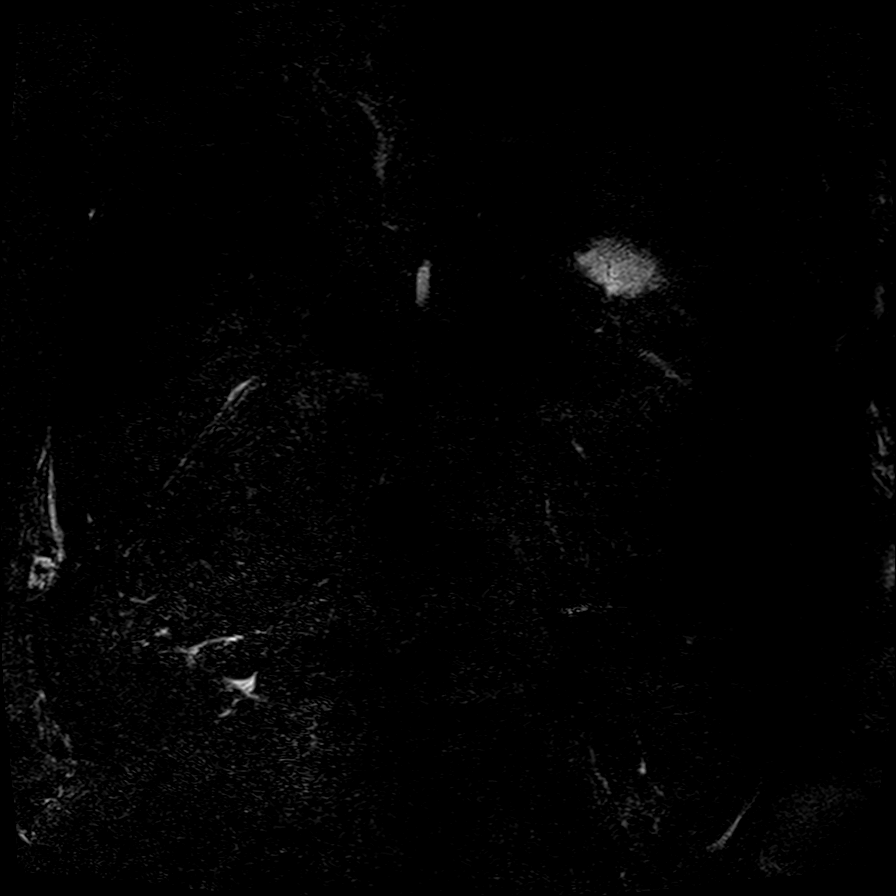

[Series 9: DWI · axial · 5.0mm · 0.99mm/px · z∈[-86,+148]mm · 4 of 80 slices shown (1 of 2)]
[im 1/80]
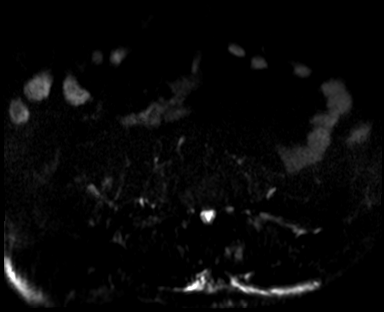
[im 27/80]
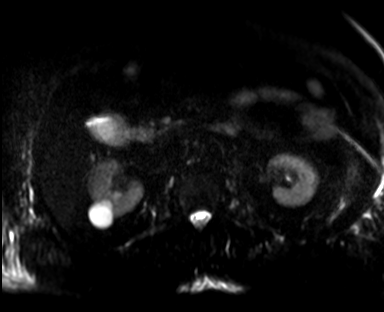
[im 53/80]
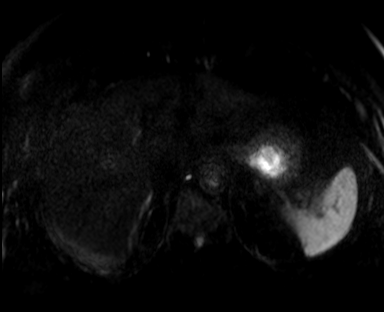
[im 80/80]
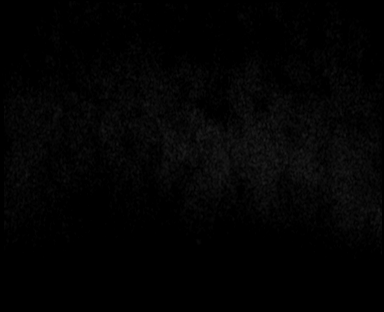

[Series 10: DWI · axial · 5.0mm · 0.99mm/px · z∈[-86,+148]mm · 2 of 40 slices shown (2 of 2)]
[im 1/40]
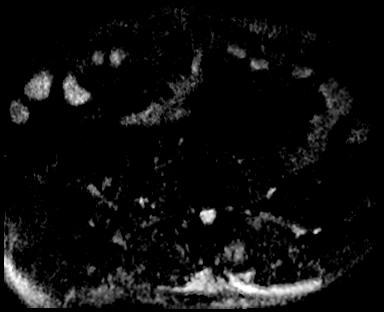
[im 40/40]
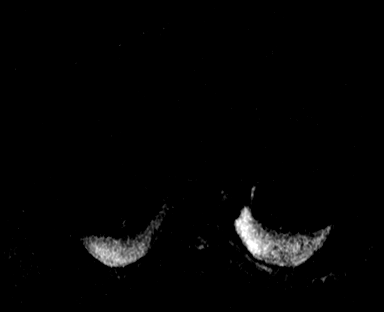

[Series 11: bSSFP · axial · 4.0mm · 0.78mm/px · z∈[-107,+29]mm · 2 of 70 slices shown]
[im 1/70]
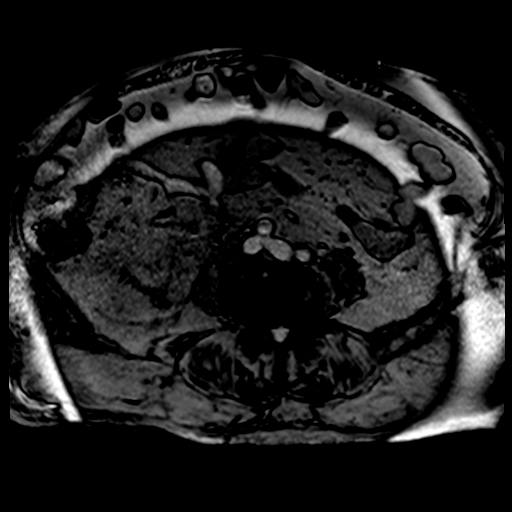
[im 35/70]
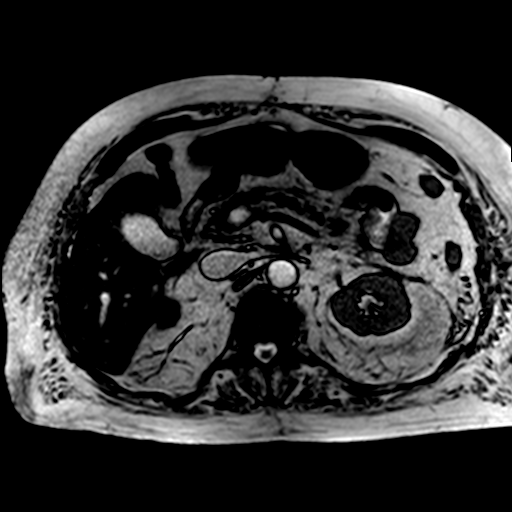

[17 of 48 positions shown; findings below may reference images not displayed]

FINDINGS: Lower chest: Small bilateral pleural effusions are new since
previous study.

Hepatobiliary: No hepatic masses identified. Gallbladder is
unremarkable. No evidence of biliary ductal dilatation or
choledocholithiasis.

Pancreas: No evidence of pancreatic ductal dilatation or pancreas
divisum. 2 tiny cystic lesions are seen in the pancreatic head and
uncinate process, which measure 9 mm and 12 mm respectively on image
32/5. These show no evidence of contrast enhancement or other
complex features.

Spleen:  Within normal limits in size and appearance.

Adrenals/Urinary Tract: No masses identified. A few small
benign-appearing right renal cysts are noted. No evidence of
hydronephrosis.

Stomach/Bowel: There is a small nodular soft tissue density seen in
the duodenum in the expected region of the ampulla, and a small
intraluminal duodenal or ampullary mass cannot be excluded.

Vascular/Lymphatic: No pathologically enlarged lymph nodes
identified. No abdominal aortic aneurysm.

Other:  None.

Musculoskeletal:  No suspicious bone lesions identified.
IMPRESSION: Two tiny cystic lesions in the pancreatic head and uncinate process,
largest measuring 12 mm. These are suspicious for indolent cystic
pancreatic neoplasms, and show no high risk features. Recommend
continued follow-up by MRI in 2 years. This recommendation follows
ACR consensus guidelines: Management of Incidental Pancreatic Cysts:
A White Paper of the ACR Incidental Findings Committee. [HOSPITAL] 3091;[DATE].

Small nodular soft tissue density in duodenum in expected region of
the ampulla; a small intraluminal duodenal or ampullary mass cannot
be excluded. Consider upper GI series or upper endoscopy for further
evaluation.

New small bilateral pleural effusions.

## 2019-09-23 DIAGNOSIS — I509 Heart failure, unspecified: Secondary | ICD-10-CM | POA: Diagnosis not present

## 2019-09-23 DIAGNOSIS — R112 Nausea with vomiting, unspecified: Secondary | ICD-10-CM | POA: Diagnosis not present

## 2019-09-23 DIAGNOSIS — E1169 Type 2 diabetes mellitus with other specified complication: Secondary | ICD-10-CM | POA: Diagnosis not present

## 2019-09-23 DIAGNOSIS — I4891 Unspecified atrial fibrillation: Secondary | ICD-10-CM | POA: Diagnosis not present

## 2019-09-23 IMAGING — CT CT ABD-PELV W/ CM
2 of 5 series · 16 of 46 positions shown, 18 images · IV contrast (Isovue)
Comparison: MR 04/07/2018, and previous

CLINICAL DATA: INDIGESTION AND NAUSEA TODAY, HX DM, COPD, CHF

EXAM:
CT ABDOMEN AND PELVIS WITH CONTRAST
TECHNIQUE: Multidetector CT imaging of the abdomen and pelvis was performed
using the standard protocol following bolus administration of
intravenous contrast.
CONTRAST:  100mL 5PEMST-011 IOPAMIDOL (5PEMST-011) INJECTION 61%

[Series 2: axial st · axial · 0.91mm/px · z∈[-299,+121]mm · 13 of 96 slices shown, 15 images]
[im 6/96  soft-tissue]
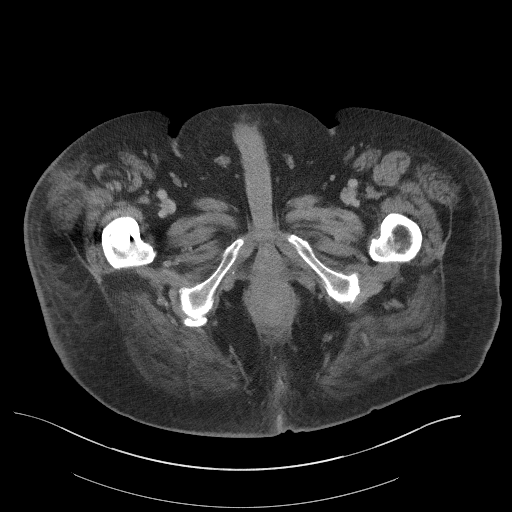
[im 6/96  bone]
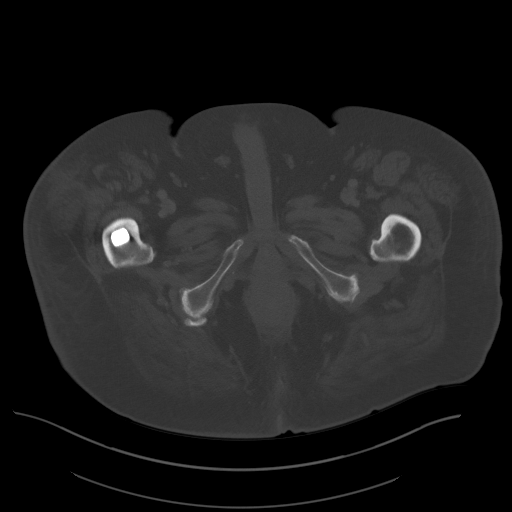
[im 11/96  soft-tissue]
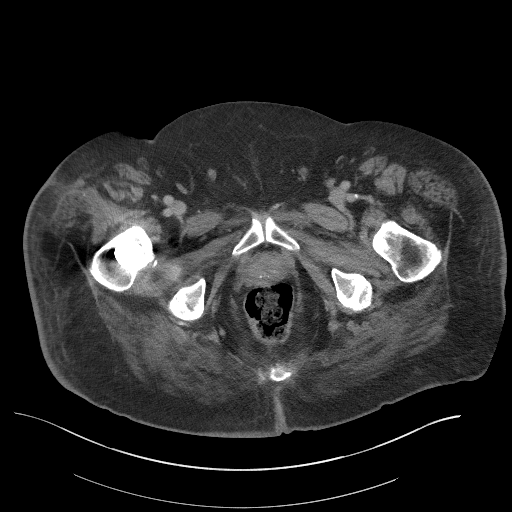
[im 22/96  soft-tissue]
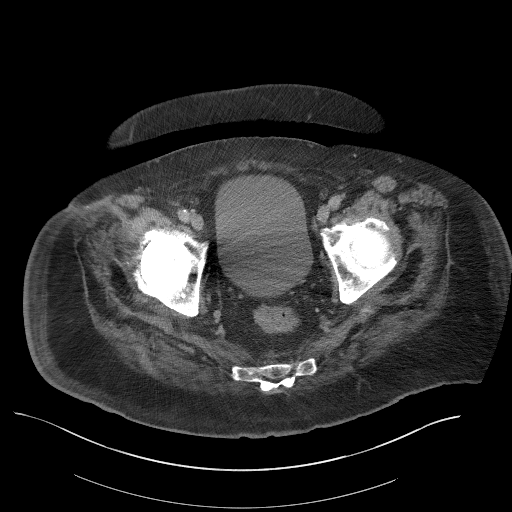
[im 27/96  soft-tissue]
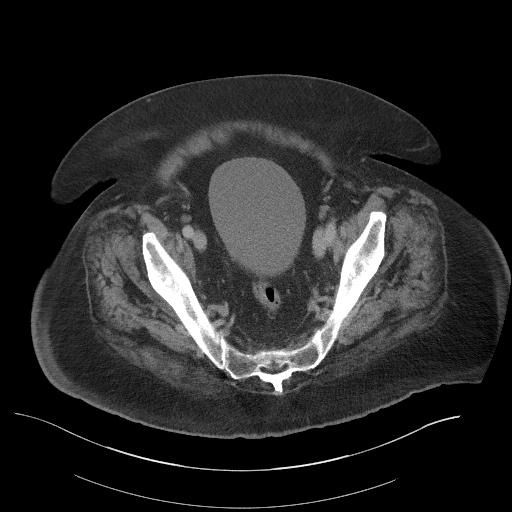
[im 32/96  soft-tissue]
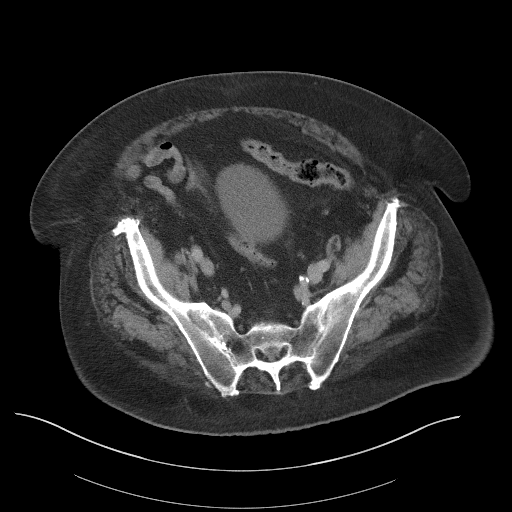
[im 43/96  soft-tissue]
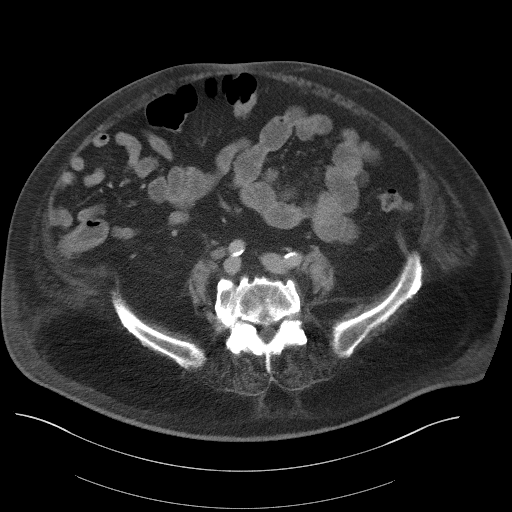
[im 48/96  soft-tissue]
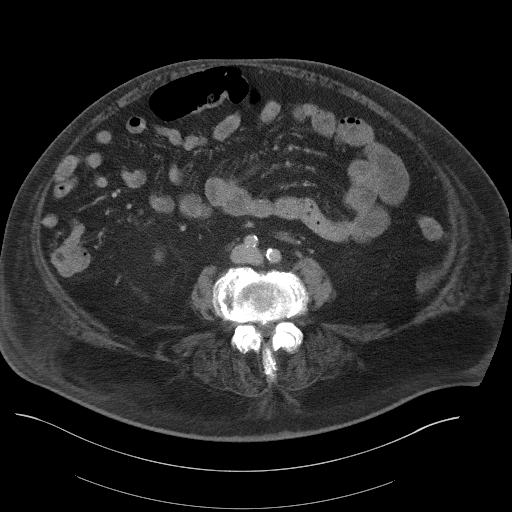
[im 53/96  soft-tissue]
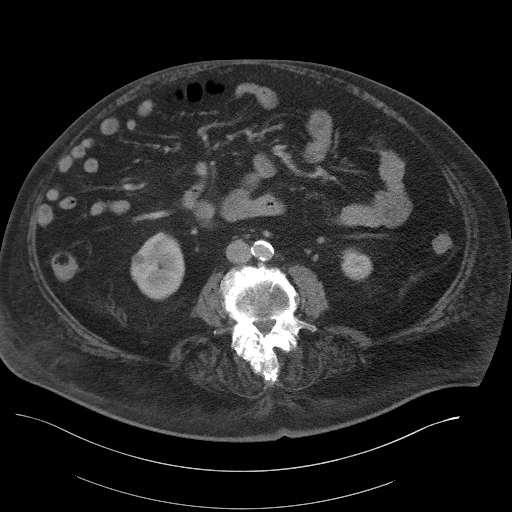
[im 64/96  soft-tissue]
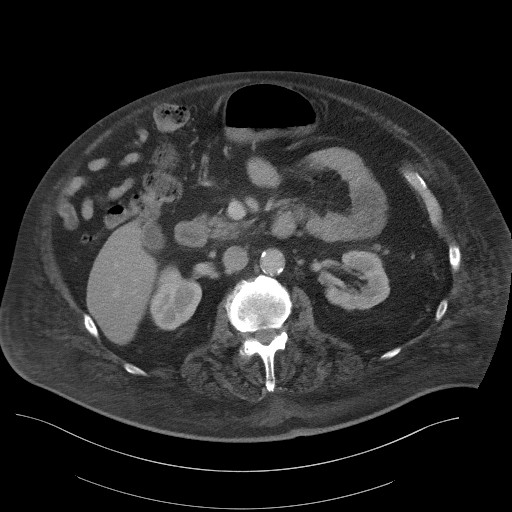
[im 64/96  bone]
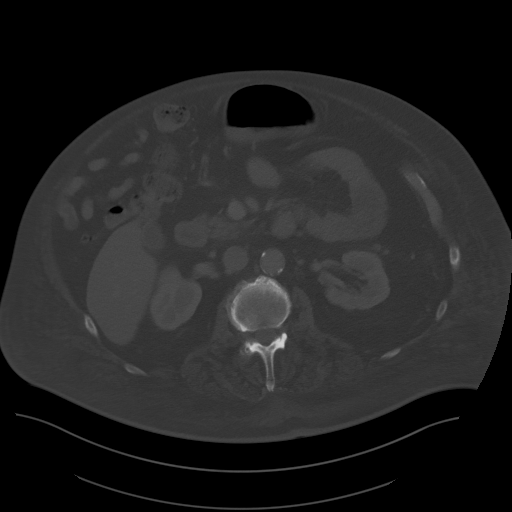
[im 69/96  soft-tissue]
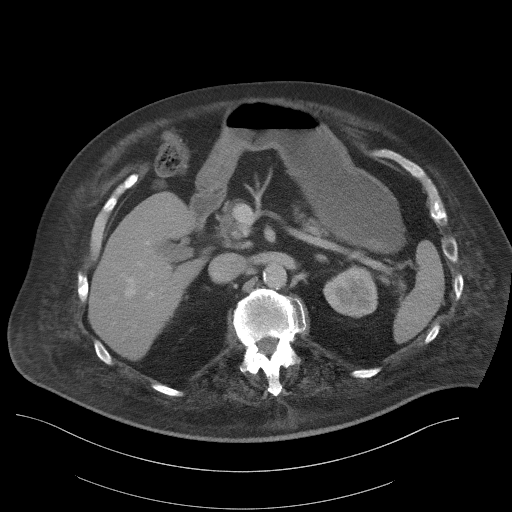
[im 74/96  soft-tissue]
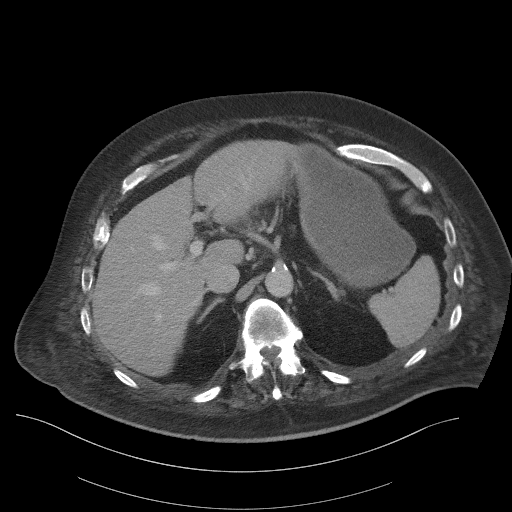
[im 85/96  soft-tissue]
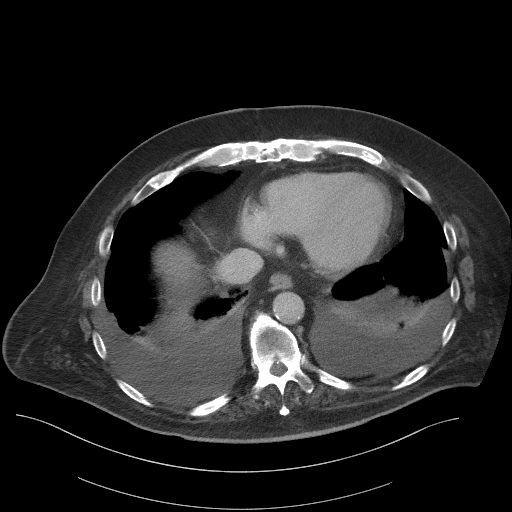
[im 90/96  soft-tissue]
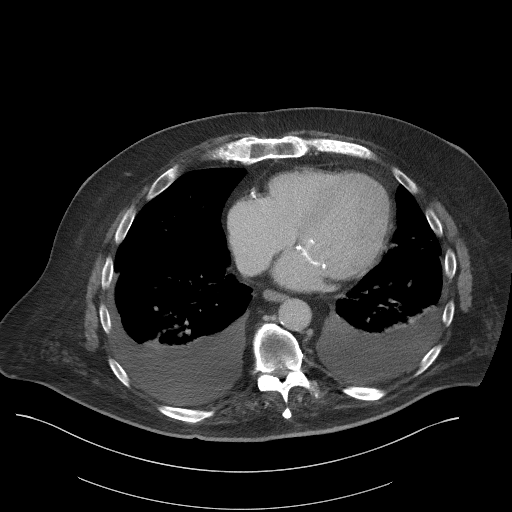

[Series 6: coronal st · coronal · 0.93mm/px · 3 of 120 slices shown]
[im 40/120  soft-tissue]
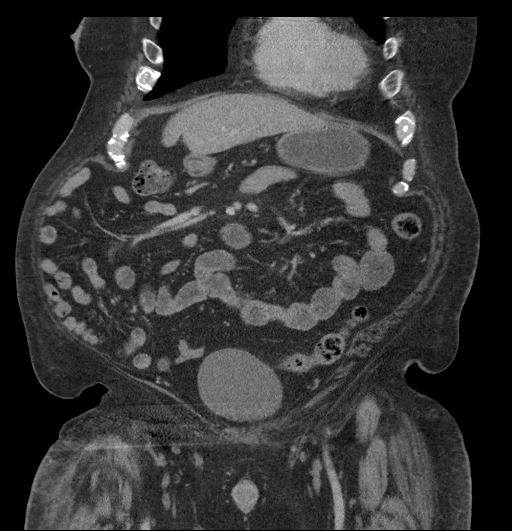
[im 53/120  soft-tissue]
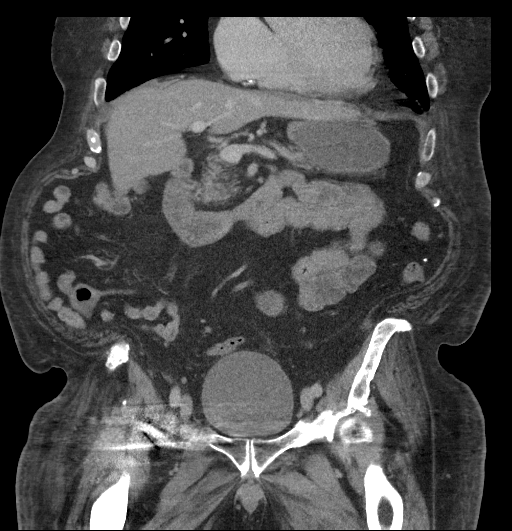
[im 67/120  soft-tissue]
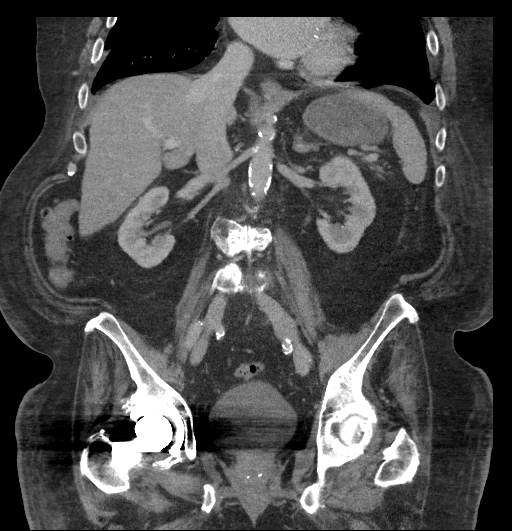

[16 of 46 positions shown; findings below may reference images not displayed]

FINDINGS: Lower chest: Moderate bilateral pleural effusions, new since CT
03/30/2018. Associated dependent atelectasis/consolidation
posteriorly in the visualized lung bases. Coronary calcifications.
Previous median sternotomy and mitral valve surgery.

Hepatobiliary: No focal liver abnormality is seen. No gallstones,
gallbladder wall thickening, or biliary dilatation.

Pancreas: The ampullary soft tissue fullness described on prior
studies is less conspicuous. Mild pancreatic parenchymal atrophy
without ductal dilatation.

Spleen: Normal in size without focal abnormality.

Adrenals/Urinary Tract: Normal adrenals. 4 mm lower pole left renal
calculus. No hydronephrosis. 2.7 cm upper pole right renal cyst
partially exophytic. Urinary bladder physiologically distended.

Stomach/Bowel: Mild fluid distention of the stomach. The small bowel
is nondilated. Normal appendix. The colon is nondilated with
scattered diverticula; no adjacent inflammatory/edematous change or
abscess.

Vascular/Lymphatic: Aortoiliac atherosclerosis ([id number]-170.0) without
aneurysm. Portal vein patent. No abdominal or pelvic adenopathy.

Reproductive: Mild prostatic prominence with central coarse
calcifications.

Other: No ascites.  No free air.

Musculoskeletal: Right hip arthroplasty hardware resulting in streak
artifact degrading some of the images. Left hip DJD. Bridging
endplate osteophytes throughout the visualized lower thoracic and
lumbar spine. Fusion across sacroiliac joints. Previous median
sternotomy. No fracture or worrisome bone lesion.
IMPRESSION: 1. Increase in bilateral moderate pleural effusions.
2. No evidence of mass, adenopathy, or other acute finding.
3. Ampullary soft tissue prominence described previously is less
conspicuous on the current examination.
4. Left nephrolithiasis.
5. Scattered colonic diverticula.

## 2019-09-24 IMAGING — DX DG CHEST 2V
2 series · 2 of 2 positions shown · non-contrast
Comparison: Lung bases from abdominal CT yesterday. Chest
radiograph 03/04/2018

CLINICAL DATA: Cough.

EXAM:
CHEST - 2 VIEW

[chest lat]
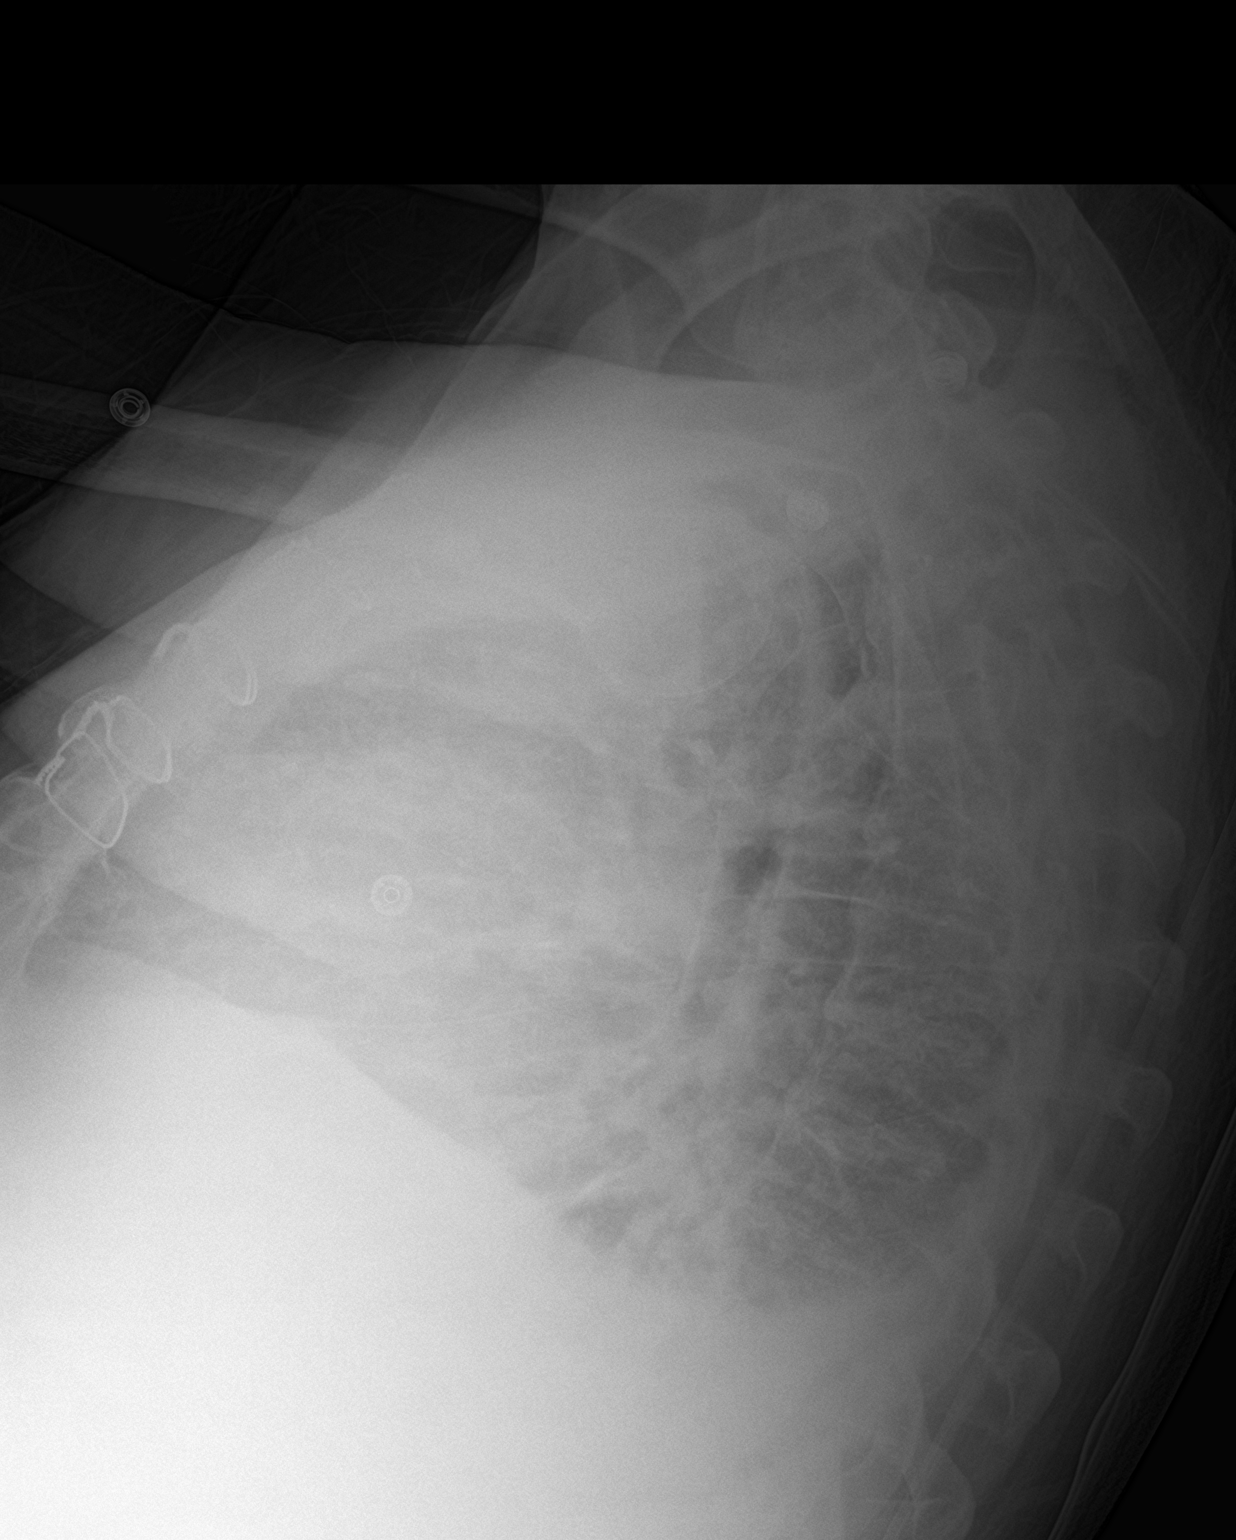

[chest ap]
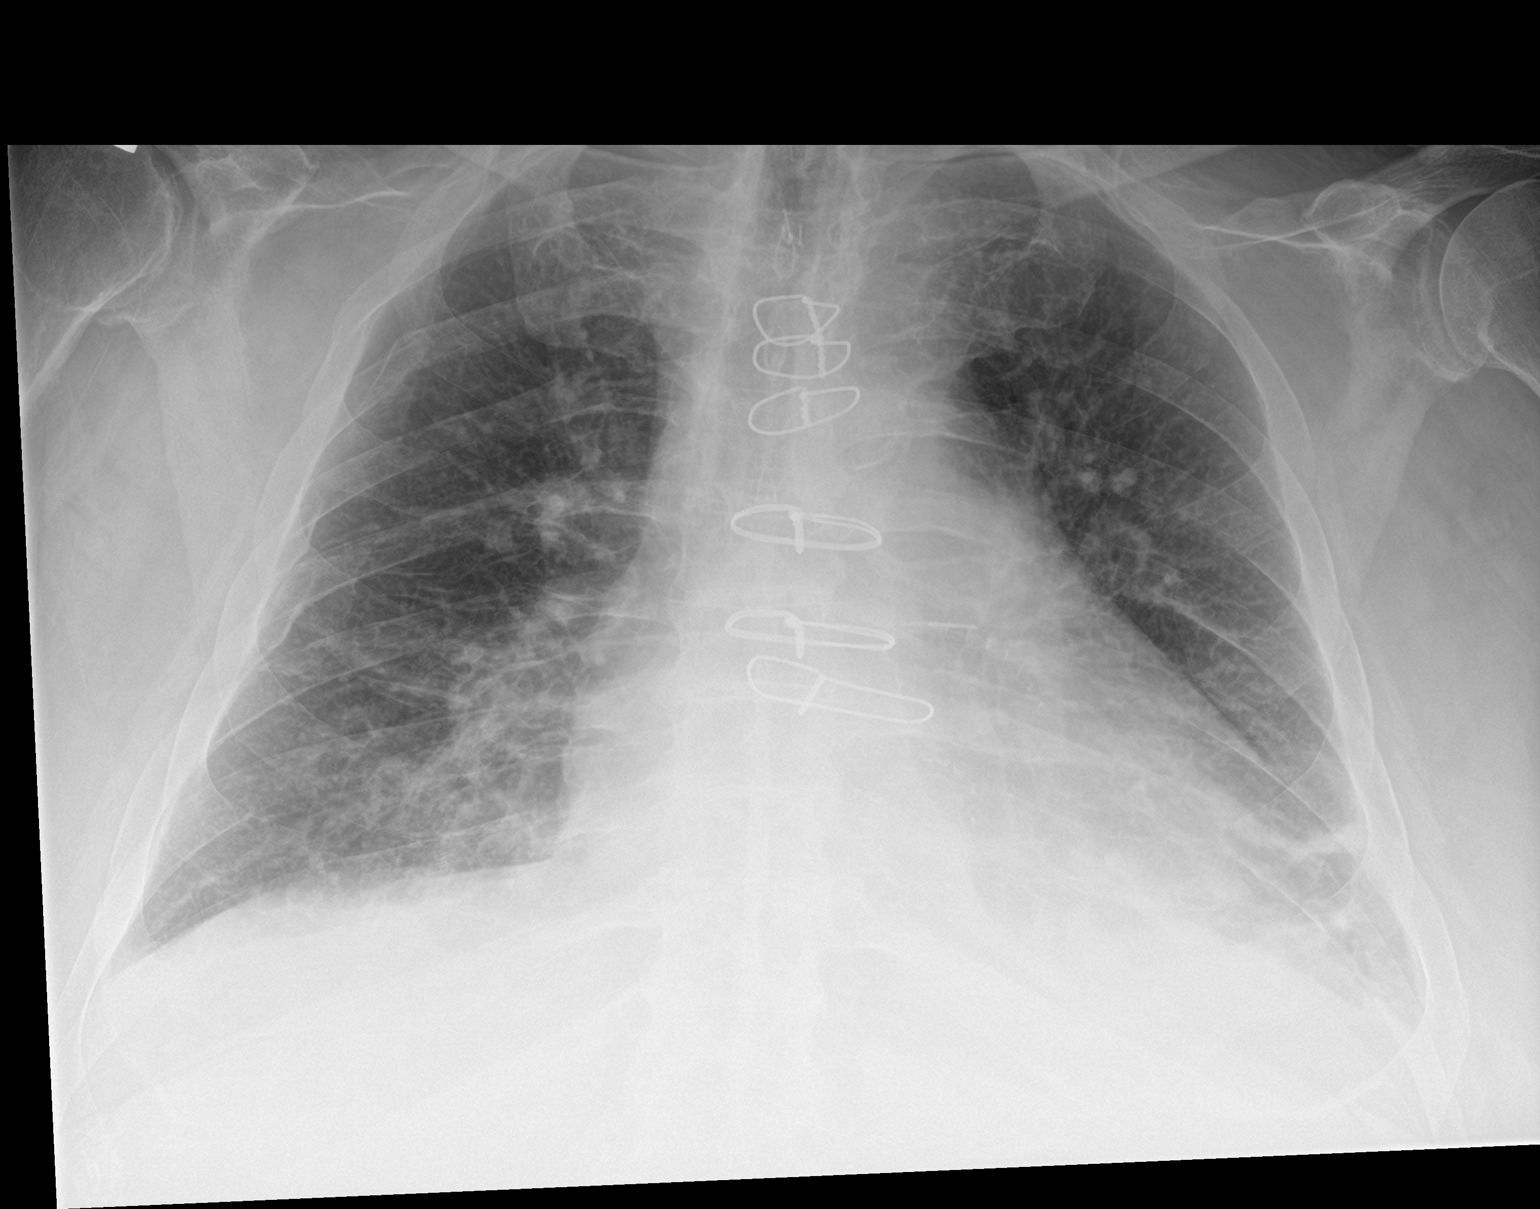

[2 of 2 positions shown; findings below may reference images not displayed]

FINDINGS: Post median sternotomy. Cardiomegaly is unchanged. Bilateral pleural
effusions and bibasilar atelectasis. No pulmonary edema. No
pneumothorax. Remote right rib fractures.
IMPRESSION: Cardiomegaly with bilateral pleural effusions and bibasilar
atelectasis. No pulmonary edema.

## 2019-09-27 DIAGNOSIS — Z20828 Contact with and (suspected) exposure to other viral communicable diseases: Secondary | ICD-10-CM | POA: Diagnosis not present

## 2019-09-28 DIAGNOSIS — R293 Abnormal posture: Secondary | ICD-10-CM | POA: Diagnosis not present

## 2019-09-28 DIAGNOSIS — I482 Chronic atrial fibrillation, unspecified: Secondary | ICD-10-CM | POA: Diagnosis not present

## 2019-09-28 DIAGNOSIS — M4327 Fusion of spine, lumbosacral region: Secondary | ICD-10-CM | POA: Diagnosis not present

## 2019-09-28 DIAGNOSIS — M6281 Muscle weakness (generalized): Secondary | ICD-10-CM | POA: Diagnosis not present

## 2019-09-28 DIAGNOSIS — I5022 Chronic systolic (congestive) heart failure: Secondary | ICD-10-CM | POA: Diagnosis not present

## 2019-09-28 DIAGNOSIS — R2689 Other abnormalities of gait and mobility: Secondary | ICD-10-CM | POA: Diagnosis not present

## 2019-09-28 DIAGNOSIS — R278 Other lack of coordination: Secondary | ICD-10-CM | POA: Diagnosis not present

## 2019-09-28 DIAGNOSIS — Z741 Need for assistance with personal care: Secondary | ICD-10-CM | POA: Diagnosis not present

## 2019-09-29 DIAGNOSIS — I5022 Chronic systolic (congestive) heart failure: Secondary | ICD-10-CM | POA: Diagnosis not present

## 2019-09-29 DIAGNOSIS — R2689 Other abnormalities of gait and mobility: Secondary | ICD-10-CM | POA: Diagnosis not present

## 2019-09-29 DIAGNOSIS — M6281 Muscle weakness (generalized): Secondary | ICD-10-CM | POA: Diagnosis not present

## 2019-09-29 DIAGNOSIS — R278 Other lack of coordination: Secondary | ICD-10-CM | POA: Diagnosis not present

## 2019-09-29 DIAGNOSIS — I482 Chronic atrial fibrillation, unspecified: Secondary | ICD-10-CM | POA: Diagnosis not present

## 2019-09-29 DIAGNOSIS — M4327 Fusion of spine, lumbosacral region: Secondary | ICD-10-CM | POA: Diagnosis not present

## 2019-09-29 IMAGING — DX DG CHEST 2V
2 series · 2 of 2 positions shown · non-contrast
Comparison: Prior radiograph from 04/12/2018.

CLINICAL DATA: Initial evaluation for acute cough.

EXAM:
CHEST - 2 VIEW

[chest lat]
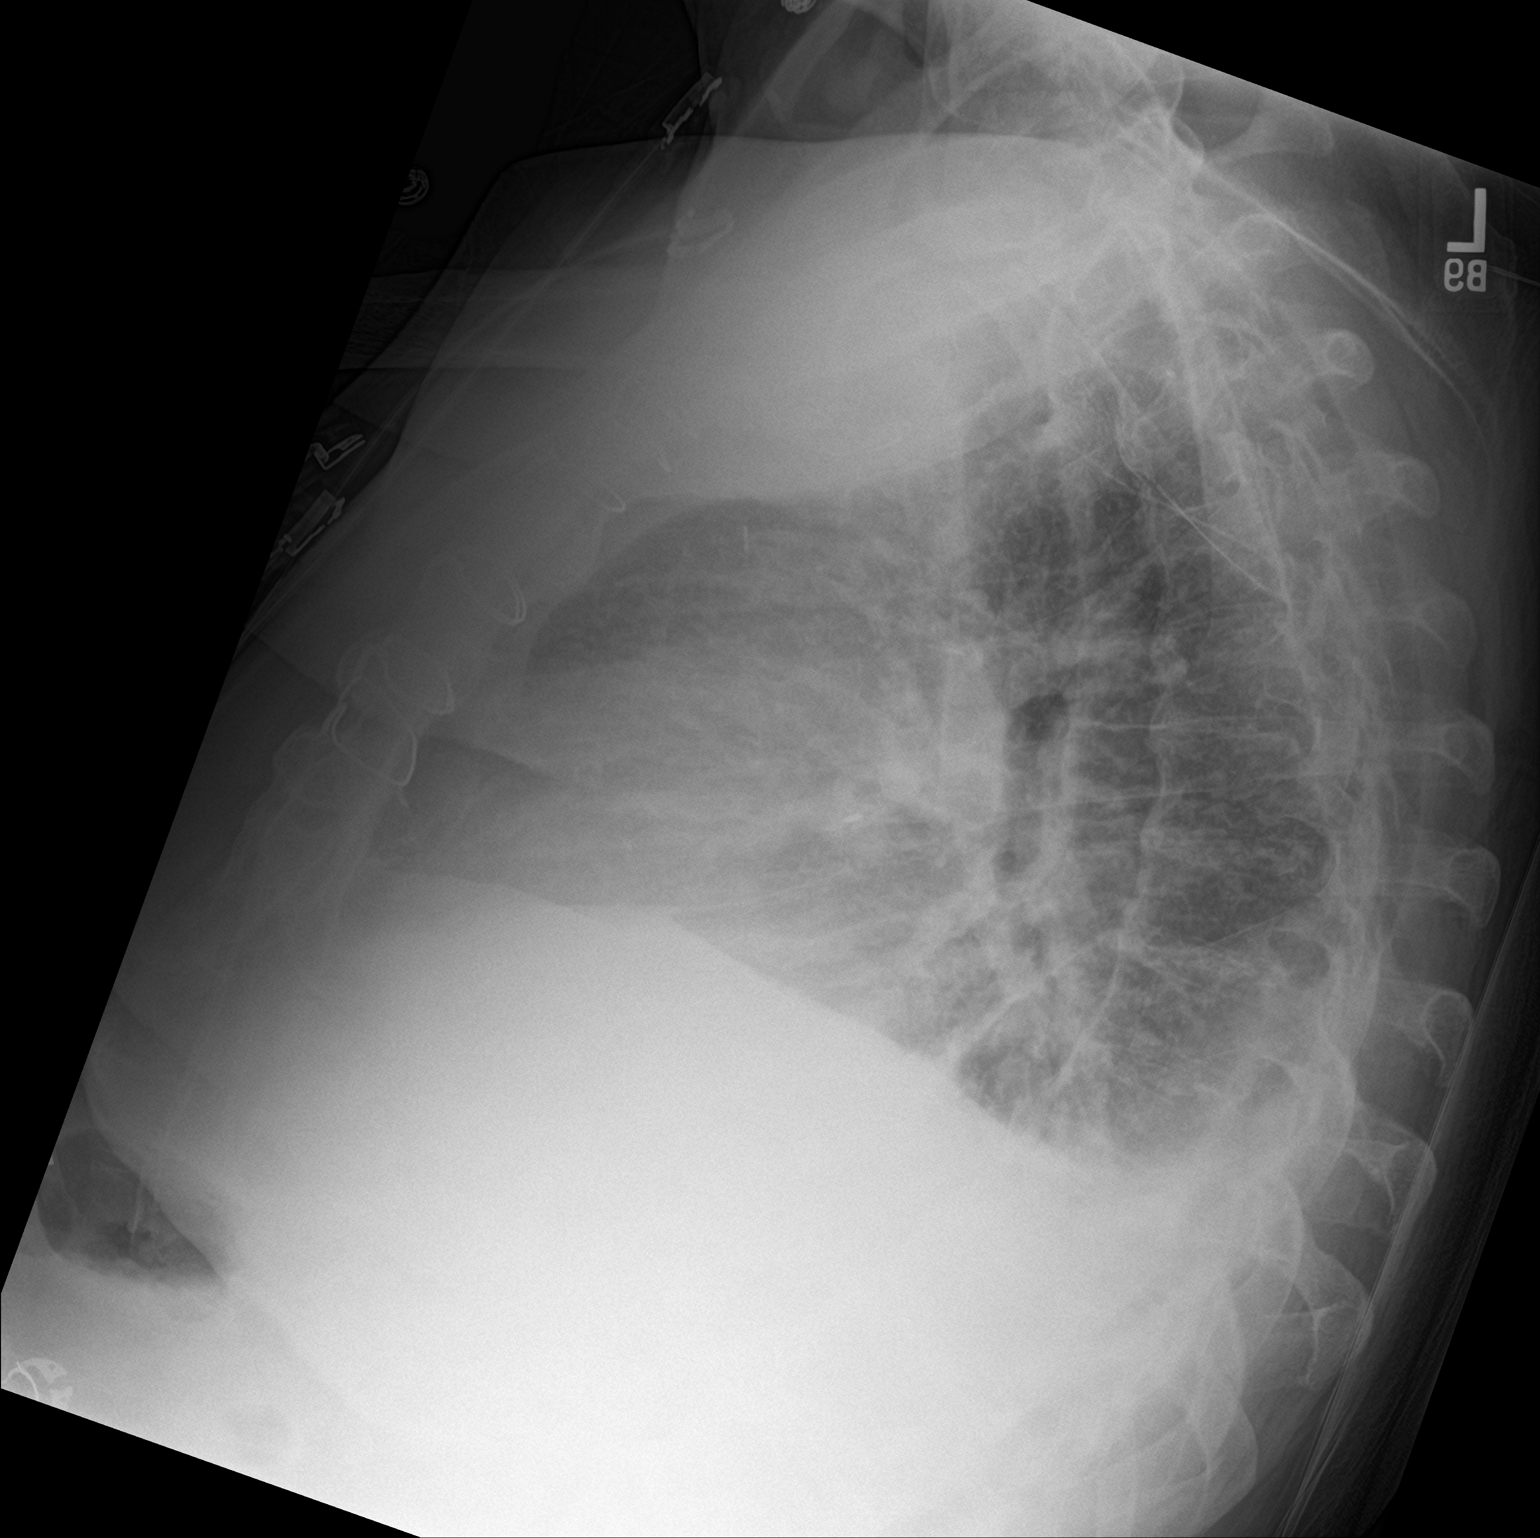

[chest ap]
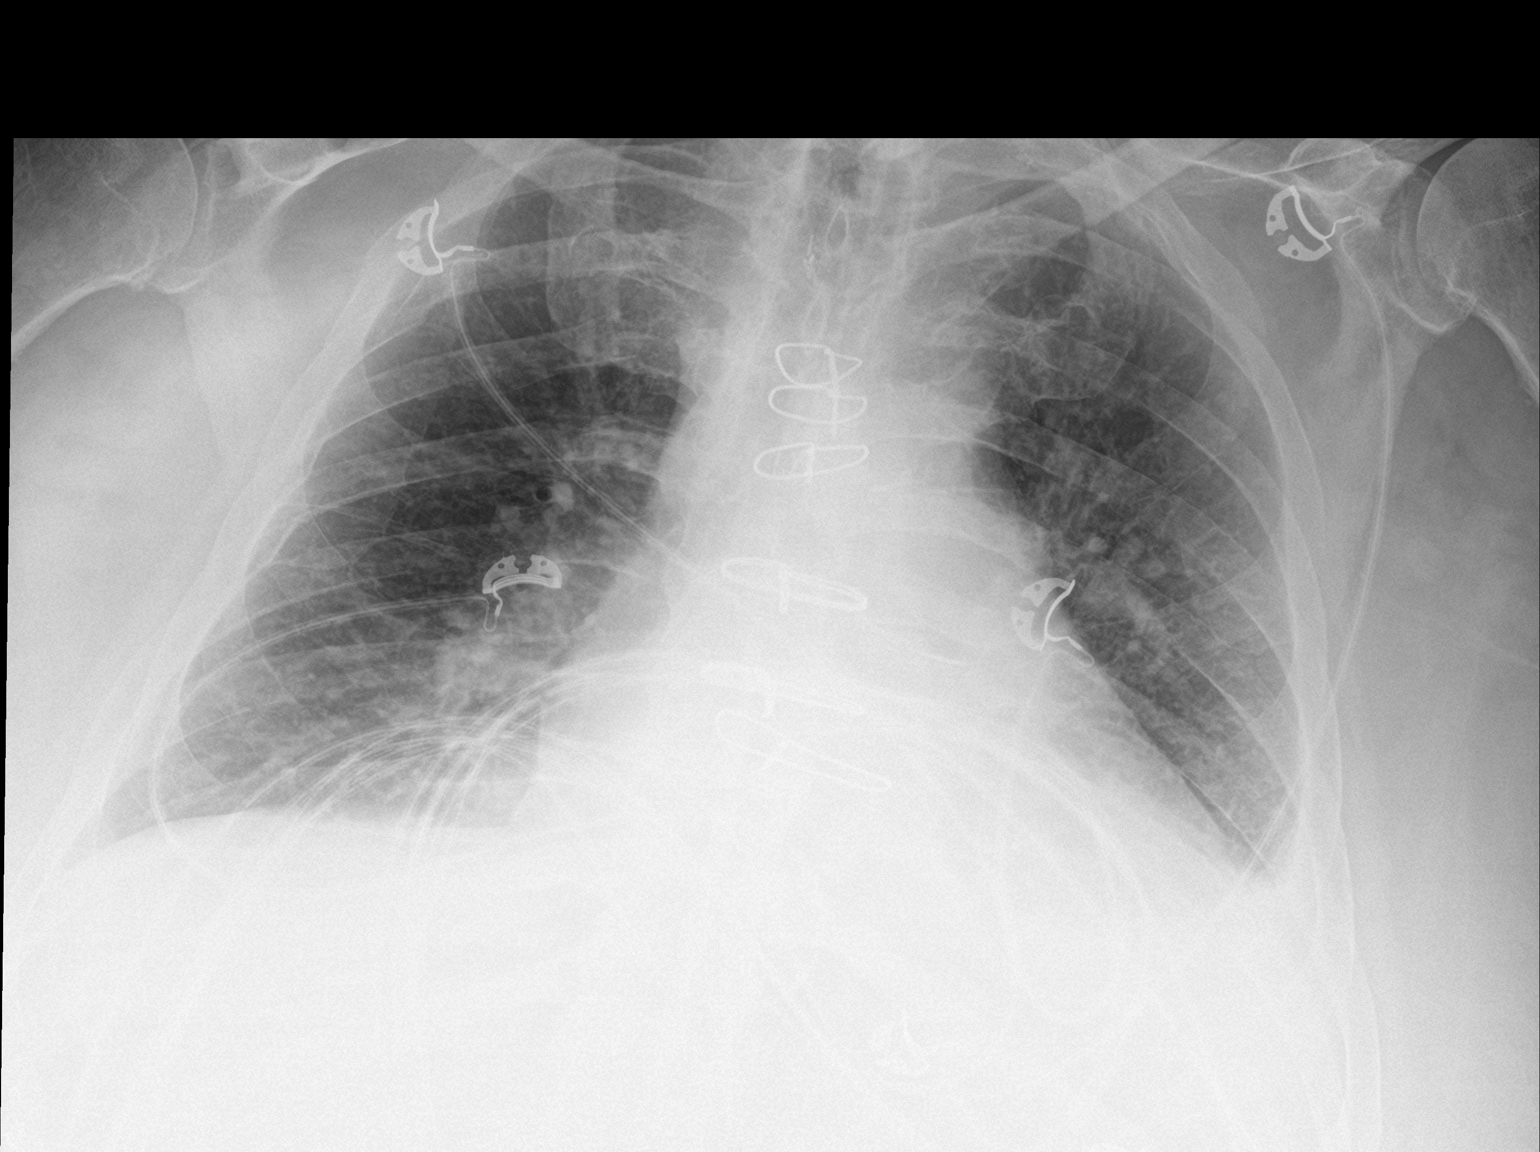

[2 of 2 positions shown; findings below may reference images not displayed]

FINDINGS: Median sternotomy wires underlying cardiomegaly, stable. Mediastinal
silhouette within normal limits.

Lungs mildly hypoinflated. Small layering left pleural effusion,
increased from previous. Associated left basilar opacity may reflect
atelectasis or infiltrate. Mild diffuse vascular congestion without
overt pulmonary edema. No pneumothorax.

No acute osseous abnormality.
IMPRESSION: 1. Cardiomegaly with mild diffuse pulmonary vascular congestion with
small left pleural effusion.
2. Associated left basilar opacity may reflect atelectasis or
infiltrate.

## 2019-09-30 DIAGNOSIS — M6281 Muscle weakness (generalized): Secondary | ICD-10-CM | POA: Diagnosis not present

## 2019-09-30 DIAGNOSIS — M4327 Fusion of spine, lumbosacral region: Secondary | ICD-10-CM | POA: Diagnosis not present

## 2019-09-30 DIAGNOSIS — R2689 Other abnormalities of gait and mobility: Secondary | ICD-10-CM | POA: Diagnosis not present

## 2019-09-30 DIAGNOSIS — R278 Other lack of coordination: Secondary | ICD-10-CM | POA: Diagnosis not present

## 2019-09-30 DIAGNOSIS — I5022 Chronic systolic (congestive) heart failure: Secondary | ICD-10-CM | POA: Diagnosis not present

## 2019-09-30 DIAGNOSIS — I482 Chronic atrial fibrillation, unspecified: Secondary | ICD-10-CM | POA: Diagnosis not present

## 2019-10-01 DIAGNOSIS — I482 Chronic atrial fibrillation, unspecified: Secondary | ICD-10-CM | POA: Diagnosis not present

## 2019-10-01 DIAGNOSIS — M6281 Muscle weakness (generalized): Secondary | ICD-10-CM | POA: Diagnosis not present

## 2019-10-01 DIAGNOSIS — M4327 Fusion of spine, lumbosacral region: Secondary | ICD-10-CM | POA: Diagnosis not present

## 2019-10-01 DIAGNOSIS — I509 Heart failure, unspecified: Secondary | ICD-10-CM | POA: Diagnosis not present

## 2019-10-01 DIAGNOSIS — R2689 Other abnormalities of gait and mobility: Secondary | ICD-10-CM | POA: Diagnosis not present

## 2019-10-01 DIAGNOSIS — I4891 Unspecified atrial fibrillation: Secondary | ICD-10-CM | POA: Diagnosis not present

## 2019-10-01 DIAGNOSIS — F4321 Adjustment disorder with depressed mood: Secondary | ICD-10-CM | POA: Diagnosis not present

## 2019-10-01 DIAGNOSIS — E1169 Type 2 diabetes mellitus with other specified complication: Secondary | ICD-10-CM | POA: Diagnosis not present

## 2019-10-01 DIAGNOSIS — R278 Other lack of coordination: Secondary | ICD-10-CM | POA: Diagnosis not present

## 2019-10-01 DIAGNOSIS — I5022 Chronic systolic (congestive) heart failure: Secondary | ICD-10-CM | POA: Diagnosis not present

## 2019-10-01 DIAGNOSIS — J449 Chronic obstructive pulmonary disease, unspecified: Secondary | ICD-10-CM | POA: Diagnosis not present

## 2019-10-02 DIAGNOSIS — Z20828 Contact with and (suspected) exposure to other viral communicable diseases: Secondary | ICD-10-CM | POA: Diagnosis not present

## 2019-10-03 DIAGNOSIS — E1169 Type 2 diabetes mellitus with other specified complication: Secondary | ICD-10-CM | POA: Diagnosis not present

## 2019-10-03 DIAGNOSIS — I4891 Unspecified atrial fibrillation: Secondary | ICD-10-CM | POA: Diagnosis not present

## 2019-10-03 DIAGNOSIS — J449 Chronic obstructive pulmonary disease, unspecified: Secondary | ICD-10-CM | POA: Diagnosis not present

## 2019-10-03 DIAGNOSIS — I509 Heart failure, unspecified: Secondary | ICD-10-CM | POA: Diagnosis not present

## 2019-10-04 DIAGNOSIS — M6281 Muscle weakness (generalized): Secondary | ICD-10-CM | POA: Diagnosis not present

## 2019-10-04 DIAGNOSIS — I482 Chronic atrial fibrillation, unspecified: Secondary | ICD-10-CM | POA: Diagnosis not present

## 2019-10-04 DIAGNOSIS — R278 Other lack of coordination: Secondary | ICD-10-CM | POA: Diagnosis not present

## 2019-10-04 DIAGNOSIS — M4327 Fusion of spine, lumbosacral region: Secondary | ICD-10-CM | POA: Diagnosis not present

## 2019-10-04 DIAGNOSIS — R2689 Other abnormalities of gait and mobility: Secondary | ICD-10-CM | POA: Diagnosis not present

## 2019-10-04 DIAGNOSIS — I5022 Chronic systolic (congestive) heart failure: Secondary | ICD-10-CM | POA: Diagnosis not present

## 2019-10-05 DIAGNOSIS — R2689 Other abnormalities of gait and mobility: Secondary | ICD-10-CM | POA: Diagnosis not present

## 2019-10-05 DIAGNOSIS — F4321 Adjustment disorder with depressed mood: Secondary | ICD-10-CM | POA: Diagnosis not present

## 2019-10-05 DIAGNOSIS — I5022 Chronic systolic (congestive) heart failure: Secondary | ICD-10-CM | POA: Diagnosis not present

## 2019-10-05 DIAGNOSIS — M6281 Muscle weakness (generalized): Secondary | ICD-10-CM | POA: Diagnosis not present

## 2019-10-05 DIAGNOSIS — I482 Chronic atrial fibrillation, unspecified: Secondary | ICD-10-CM | POA: Diagnosis not present

## 2019-10-05 DIAGNOSIS — R278 Other lack of coordination: Secondary | ICD-10-CM | POA: Diagnosis not present

## 2019-10-05 DIAGNOSIS — M4327 Fusion of spine, lumbosacral region: Secondary | ICD-10-CM | POA: Diagnosis not present

## 2019-10-06 DIAGNOSIS — I5022 Chronic systolic (congestive) heart failure: Secondary | ICD-10-CM | POA: Diagnosis not present

## 2019-10-06 DIAGNOSIS — R2689 Other abnormalities of gait and mobility: Secondary | ICD-10-CM | POA: Diagnosis not present

## 2019-10-06 DIAGNOSIS — I482 Chronic atrial fibrillation, unspecified: Secondary | ICD-10-CM | POA: Diagnosis not present

## 2019-10-06 DIAGNOSIS — M4327 Fusion of spine, lumbosacral region: Secondary | ICD-10-CM | POA: Diagnosis not present

## 2019-10-06 DIAGNOSIS — R278 Other lack of coordination: Secondary | ICD-10-CM | POA: Diagnosis not present

## 2019-10-06 DIAGNOSIS — M6281 Muscle weakness (generalized): Secondary | ICD-10-CM | POA: Diagnosis not present

## 2019-10-06 DIAGNOSIS — Z20828 Contact with and (suspected) exposure to other viral communicable diseases: Secondary | ICD-10-CM | POA: Diagnosis not present

## 2019-10-07 DIAGNOSIS — M4327 Fusion of spine, lumbosacral region: Secondary | ICD-10-CM | POA: Diagnosis not present

## 2019-10-07 DIAGNOSIS — J449 Chronic obstructive pulmonary disease, unspecified: Secondary | ICD-10-CM | POA: Diagnosis not present

## 2019-10-07 DIAGNOSIS — R197 Diarrhea, unspecified: Secondary | ICD-10-CM | POA: Diagnosis not present

## 2019-10-07 DIAGNOSIS — M6281 Muscle weakness (generalized): Secondary | ICD-10-CM | POA: Diagnosis not present

## 2019-10-07 DIAGNOSIS — I5022 Chronic systolic (congestive) heart failure: Secondary | ICD-10-CM | POA: Diagnosis not present

## 2019-10-07 DIAGNOSIS — I482 Chronic atrial fibrillation, unspecified: Secondary | ICD-10-CM | POA: Diagnosis not present

## 2019-10-07 DIAGNOSIS — R293 Abnormal posture: Secondary | ICD-10-CM | POA: Diagnosis not present

## 2019-10-07 DIAGNOSIS — R278 Other lack of coordination: Secondary | ICD-10-CM | POA: Diagnosis not present

## 2019-10-07 DIAGNOSIS — Z741 Need for assistance with personal care: Secondary | ICD-10-CM | POA: Diagnosis not present

## 2019-10-07 DIAGNOSIS — K3184 Gastroparesis: Secondary | ICD-10-CM | POA: Diagnosis not present

## 2019-10-07 DIAGNOSIS — E1169 Type 2 diabetes mellitus with other specified complication: Secondary | ICD-10-CM | POA: Diagnosis not present

## 2019-10-07 DIAGNOSIS — R2689 Other abnormalities of gait and mobility: Secondary | ICD-10-CM | POA: Diagnosis not present

## 2019-10-08 DIAGNOSIS — I5022 Chronic systolic (congestive) heart failure: Secondary | ICD-10-CM | POA: Diagnosis not present

## 2019-10-08 DIAGNOSIS — I482 Chronic atrial fibrillation, unspecified: Secondary | ICD-10-CM | POA: Diagnosis not present

## 2019-10-08 DIAGNOSIS — M6281 Muscle weakness (generalized): Secondary | ICD-10-CM | POA: Diagnosis not present

## 2019-10-08 DIAGNOSIS — M4327 Fusion of spine, lumbosacral region: Secondary | ICD-10-CM | POA: Diagnosis not present

## 2019-10-08 DIAGNOSIS — R278 Other lack of coordination: Secondary | ICD-10-CM | POA: Diagnosis not present

## 2019-10-08 DIAGNOSIS — R2689 Other abnormalities of gait and mobility: Secondary | ICD-10-CM | POA: Diagnosis not present

## 2019-10-11 DIAGNOSIS — R278 Other lack of coordination: Secondary | ICD-10-CM | POA: Diagnosis not present

## 2019-10-11 DIAGNOSIS — M6281 Muscle weakness (generalized): Secondary | ICD-10-CM | POA: Diagnosis not present

## 2019-10-11 DIAGNOSIS — I5022 Chronic systolic (congestive) heart failure: Secondary | ICD-10-CM | POA: Diagnosis not present

## 2019-10-11 DIAGNOSIS — M4327 Fusion of spine, lumbosacral region: Secondary | ICD-10-CM | POA: Diagnosis not present

## 2019-10-11 DIAGNOSIS — R2689 Other abnormalities of gait and mobility: Secondary | ICD-10-CM | POA: Diagnosis not present

## 2019-10-11 DIAGNOSIS — I482 Chronic atrial fibrillation, unspecified: Secondary | ICD-10-CM | POA: Diagnosis not present

## 2019-10-12 DIAGNOSIS — M6281 Muscle weakness (generalized): Secondary | ICD-10-CM | POA: Diagnosis not present

## 2019-10-12 DIAGNOSIS — R2689 Other abnormalities of gait and mobility: Secondary | ICD-10-CM | POA: Diagnosis not present

## 2019-10-12 DIAGNOSIS — M4327 Fusion of spine, lumbosacral region: Secondary | ICD-10-CM | POA: Diagnosis not present

## 2019-10-12 DIAGNOSIS — R278 Other lack of coordination: Secondary | ICD-10-CM | POA: Diagnosis not present

## 2019-10-12 DIAGNOSIS — I482 Chronic atrial fibrillation, unspecified: Secondary | ICD-10-CM | POA: Diagnosis not present

## 2019-10-12 DIAGNOSIS — I5022 Chronic systolic (congestive) heart failure: Secondary | ICD-10-CM | POA: Diagnosis not present

## 2019-10-12 DIAGNOSIS — Z1159 Encounter for screening for other viral diseases: Secondary | ICD-10-CM | POA: Diagnosis not present

## 2019-10-13 DIAGNOSIS — A0472 Enterocolitis due to Clostridium difficile, not specified as recurrent: Secondary | ICD-10-CM | POA: Diagnosis not present

## 2019-10-13 DIAGNOSIS — M6281 Muscle weakness (generalized): Secondary | ICD-10-CM | POA: Diagnosis not present

## 2019-10-13 DIAGNOSIS — M4327 Fusion of spine, lumbosacral region: Secondary | ICD-10-CM | POA: Diagnosis not present

## 2019-10-13 DIAGNOSIS — R278 Other lack of coordination: Secondary | ICD-10-CM | POA: Diagnosis not present

## 2019-10-13 DIAGNOSIS — I482 Chronic atrial fibrillation, unspecified: Secondary | ICD-10-CM | POA: Diagnosis not present

## 2019-10-13 DIAGNOSIS — A048 Other specified bacterial intestinal infections: Secondary | ICD-10-CM | POA: Diagnosis not present

## 2019-10-13 DIAGNOSIS — I5022 Chronic systolic (congestive) heart failure: Secondary | ICD-10-CM | POA: Diagnosis not present

## 2019-10-13 DIAGNOSIS — R2689 Other abnormalities of gait and mobility: Secondary | ICD-10-CM | POA: Diagnosis not present

## 2019-10-14 DIAGNOSIS — R2689 Other abnormalities of gait and mobility: Secondary | ICD-10-CM | POA: Diagnosis not present

## 2019-10-14 DIAGNOSIS — I5022 Chronic systolic (congestive) heart failure: Secondary | ICD-10-CM | POA: Diagnosis not present

## 2019-10-14 DIAGNOSIS — I482 Chronic atrial fibrillation, unspecified: Secondary | ICD-10-CM | POA: Diagnosis not present

## 2019-10-14 DIAGNOSIS — R278 Other lack of coordination: Secondary | ICD-10-CM | POA: Diagnosis not present

## 2019-10-14 DIAGNOSIS — M6281 Muscle weakness (generalized): Secondary | ICD-10-CM | POA: Diagnosis not present

## 2019-10-14 DIAGNOSIS — M4327 Fusion of spine, lumbosacral region: Secondary | ICD-10-CM | POA: Diagnosis not present

## 2019-10-15 DIAGNOSIS — I5022 Chronic systolic (congestive) heart failure: Secondary | ICD-10-CM | POA: Diagnosis not present

## 2019-10-15 DIAGNOSIS — R2689 Other abnormalities of gait and mobility: Secondary | ICD-10-CM | POA: Diagnosis not present

## 2019-10-15 DIAGNOSIS — M4327 Fusion of spine, lumbosacral region: Secondary | ICD-10-CM | POA: Diagnosis not present

## 2019-10-15 DIAGNOSIS — I482 Chronic atrial fibrillation, unspecified: Secondary | ICD-10-CM | POA: Diagnosis not present

## 2019-10-15 DIAGNOSIS — R278 Other lack of coordination: Secondary | ICD-10-CM | POA: Diagnosis not present

## 2019-10-15 DIAGNOSIS — M6281 Muscle weakness (generalized): Secondary | ICD-10-CM | POA: Diagnosis not present

## 2019-10-18 DIAGNOSIS — R278 Other lack of coordination: Secondary | ICD-10-CM | POA: Diagnosis not present

## 2019-10-18 DIAGNOSIS — M6281 Muscle weakness (generalized): Secondary | ICD-10-CM | POA: Diagnosis not present

## 2019-10-18 DIAGNOSIS — M4327 Fusion of spine, lumbosacral region: Secondary | ICD-10-CM | POA: Diagnosis not present

## 2019-10-18 DIAGNOSIS — I482 Chronic atrial fibrillation, unspecified: Secondary | ICD-10-CM | POA: Diagnosis not present

## 2019-10-18 DIAGNOSIS — I5022 Chronic systolic (congestive) heart failure: Secondary | ICD-10-CM | POA: Diagnosis not present

## 2019-10-18 DIAGNOSIS — R2689 Other abnormalities of gait and mobility: Secondary | ICD-10-CM | POA: Diagnosis not present

## 2019-10-19 DIAGNOSIS — Z20828 Contact with and (suspected) exposure to other viral communicable diseases: Secondary | ICD-10-CM | POA: Diagnosis not present

## 2019-10-19 DIAGNOSIS — R2689 Other abnormalities of gait and mobility: Secondary | ICD-10-CM | POA: Diagnosis not present

## 2019-10-19 DIAGNOSIS — R278 Other lack of coordination: Secondary | ICD-10-CM | POA: Diagnosis not present

## 2019-10-19 DIAGNOSIS — I5022 Chronic systolic (congestive) heart failure: Secondary | ICD-10-CM | POA: Diagnosis not present

## 2019-10-19 DIAGNOSIS — M6281 Muscle weakness (generalized): Secondary | ICD-10-CM | POA: Diagnosis not present

## 2019-10-19 DIAGNOSIS — M4327 Fusion of spine, lumbosacral region: Secondary | ICD-10-CM | POA: Diagnosis not present

## 2019-10-19 DIAGNOSIS — I482 Chronic atrial fibrillation, unspecified: Secondary | ICD-10-CM | POA: Diagnosis not present

## 2019-10-20 DIAGNOSIS — I482 Chronic atrial fibrillation, unspecified: Secondary | ICD-10-CM | POA: Diagnosis not present

## 2019-10-20 DIAGNOSIS — I5022 Chronic systolic (congestive) heart failure: Secondary | ICD-10-CM | POA: Diagnosis not present

## 2019-10-20 DIAGNOSIS — R2689 Other abnormalities of gait and mobility: Secondary | ICD-10-CM | POA: Diagnosis not present

## 2019-10-20 DIAGNOSIS — R278 Other lack of coordination: Secondary | ICD-10-CM | POA: Diagnosis not present

## 2019-10-20 DIAGNOSIS — M6281 Muscle weakness (generalized): Secondary | ICD-10-CM | POA: Diagnosis not present

## 2019-10-20 DIAGNOSIS — M4327 Fusion of spine, lumbosacral region: Secondary | ICD-10-CM | POA: Diagnosis not present

## 2019-10-21 DIAGNOSIS — R2689 Other abnormalities of gait and mobility: Secondary | ICD-10-CM | POA: Diagnosis not present

## 2019-10-21 DIAGNOSIS — M6281 Muscle weakness (generalized): Secondary | ICD-10-CM | POA: Diagnosis not present

## 2019-10-21 DIAGNOSIS — I482 Chronic atrial fibrillation, unspecified: Secondary | ICD-10-CM | POA: Diagnosis not present

## 2019-10-21 DIAGNOSIS — I5022 Chronic systolic (congestive) heart failure: Secondary | ICD-10-CM | POA: Diagnosis not present

## 2019-10-21 DIAGNOSIS — R278 Other lack of coordination: Secondary | ICD-10-CM | POA: Diagnosis not present

## 2019-10-21 DIAGNOSIS — M4327 Fusion of spine, lumbosacral region: Secondary | ICD-10-CM | POA: Diagnosis not present

## 2019-10-22 DIAGNOSIS — I5022 Chronic systolic (congestive) heart failure: Secondary | ICD-10-CM | POA: Diagnosis not present

## 2019-10-22 DIAGNOSIS — R2689 Other abnormalities of gait and mobility: Secondary | ICD-10-CM | POA: Diagnosis not present

## 2019-10-22 DIAGNOSIS — F4321 Adjustment disorder with depressed mood: Secondary | ICD-10-CM | POA: Diagnosis not present

## 2019-10-22 DIAGNOSIS — I482 Chronic atrial fibrillation, unspecified: Secondary | ICD-10-CM | POA: Diagnosis not present

## 2019-10-22 DIAGNOSIS — M6281 Muscle weakness (generalized): Secondary | ICD-10-CM | POA: Diagnosis not present

## 2019-10-22 DIAGNOSIS — M4327 Fusion of spine, lumbosacral region: Secondary | ICD-10-CM | POA: Diagnosis not present

## 2019-10-22 DIAGNOSIS — R278 Other lack of coordination: Secondary | ICD-10-CM | POA: Diagnosis not present

## 2019-10-24 DIAGNOSIS — M4327 Fusion of spine, lumbosacral region: Secondary | ICD-10-CM | POA: Diagnosis not present

## 2019-10-24 DIAGNOSIS — I482 Chronic atrial fibrillation, unspecified: Secondary | ICD-10-CM | POA: Diagnosis not present

## 2019-10-24 DIAGNOSIS — M6281 Muscle weakness (generalized): Secondary | ICD-10-CM | POA: Diagnosis not present

## 2019-10-24 DIAGNOSIS — I5022 Chronic systolic (congestive) heart failure: Secondary | ICD-10-CM | POA: Diagnosis not present

## 2019-10-24 DIAGNOSIS — R278 Other lack of coordination: Secondary | ICD-10-CM | POA: Diagnosis not present

## 2019-10-24 DIAGNOSIS — R2689 Other abnormalities of gait and mobility: Secondary | ICD-10-CM | POA: Diagnosis not present

## 2019-10-25 DIAGNOSIS — M6281 Muscle weakness (generalized): Secondary | ICD-10-CM | POA: Diagnosis not present

## 2019-10-25 DIAGNOSIS — R2689 Other abnormalities of gait and mobility: Secondary | ICD-10-CM | POA: Diagnosis not present

## 2019-10-25 DIAGNOSIS — I482 Chronic atrial fibrillation, unspecified: Secondary | ICD-10-CM | POA: Diagnosis not present

## 2019-10-25 DIAGNOSIS — M4327 Fusion of spine, lumbosacral region: Secondary | ICD-10-CM | POA: Diagnosis not present

## 2019-10-25 DIAGNOSIS — I5022 Chronic systolic (congestive) heart failure: Secondary | ICD-10-CM | POA: Diagnosis not present

## 2019-10-25 DIAGNOSIS — R278 Other lack of coordination: Secondary | ICD-10-CM | POA: Diagnosis not present

## 2019-10-26 DIAGNOSIS — I5022 Chronic systolic (congestive) heart failure: Secondary | ICD-10-CM | POA: Diagnosis not present

## 2019-10-26 DIAGNOSIS — R2689 Other abnormalities of gait and mobility: Secondary | ICD-10-CM | POA: Diagnosis not present

## 2019-10-26 DIAGNOSIS — R278 Other lack of coordination: Secondary | ICD-10-CM | POA: Diagnosis not present

## 2019-10-26 DIAGNOSIS — M4327 Fusion of spine, lumbosacral region: Secondary | ICD-10-CM | POA: Diagnosis not present

## 2019-10-26 DIAGNOSIS — I482 Chronic atrial fibrillation, unspecified: Secondary | ICD-10-CM | POA: Diagnosis not present

## 2019-10-26 DIAGNOSIS — M6281 Muscle weakness (generalized): Secondary | ICD-10-CM | POA: Diagnosis not present

## 2019-10-27 DIAGNOSIS — I5022 Chronic systolic (congestive) heart failure: Secondary | ICD-10-CM | POA: Diagnosis not present

## 2019-10-27 DIAGNOSIS — I482 Chronic atrial fibrillation, unspecified: Secondary | ICD-10-CM | POA: Diagnosis not present

## 2019-10-27 DIAGNOSIS — R278 Other lack of coordination: Secondary | ICD-10-CM | POA: Diagnosis not present

## 2019-10-27 DIAGNOSIS — M6281 Muscle weakness (generalized): Secondary | ICD-10-CM | POA: Diagnosis not present

## 2019-10-27 DIAGNOSIS — Z20828 Contact with and (suspected) exposure to other viral communicable diseases: Secondary | ICD-10-CM | POA: Diagnosis not present

## 2019-10-27 DIAGNOSIS — R2689 Other abnormalities of gait and mobility: Secondary | ICD-10-CM | POA: Diagnosis not present

## 2019-10-27 DIAGNOSIS — M4327 Fusion of spine, lumbosacral region: Secondary | ICD-10-CM | POA: Diagnosis not present

## 2019-10-28 DIAGNOSIS — M6281 Muscle weakness (generalized): Secondary | ICD-10-CM | POA: Diagnosis not present

## 2019-10-28 DIAGNOSIS — M4327 Fusion of spine, lumbosacral region: Secondary | ICD-10-CM | POA: Diagnosis not present

## 2019-10-28 DIAGNOSIS — I5022 Chronic systolic (congestive) heart failure: Secondary | ICD-10-CM | POA: Diagnosis not present

## 2019-10-28 DIAGNOSIS — I482 Chronic atrial fibrillation, unspecified: Secondary | ICD-10-CM | POA: Diagnosis not present

## 2019-10-28 DIAGNOSIS — R278 Other lack of coordination: Secondary | ICD-10-CM | POA: Diagnosis not present

## 2019-10-28 DIAGNOSIS — R2689 Other abnormalities of gait and mobility: Secondary | ICD-10-CM | POA: Diagnosis not present

## 2019-10-28 IMAGING — DX DG CHEST 2V
2 series · 2 of 2 positions shown · non-contrast
Comparison: 04/17/2018

CLINICAL DATA: Fall this morning.

EXAM:
CHEST - 2 VIEW

[chest lat]
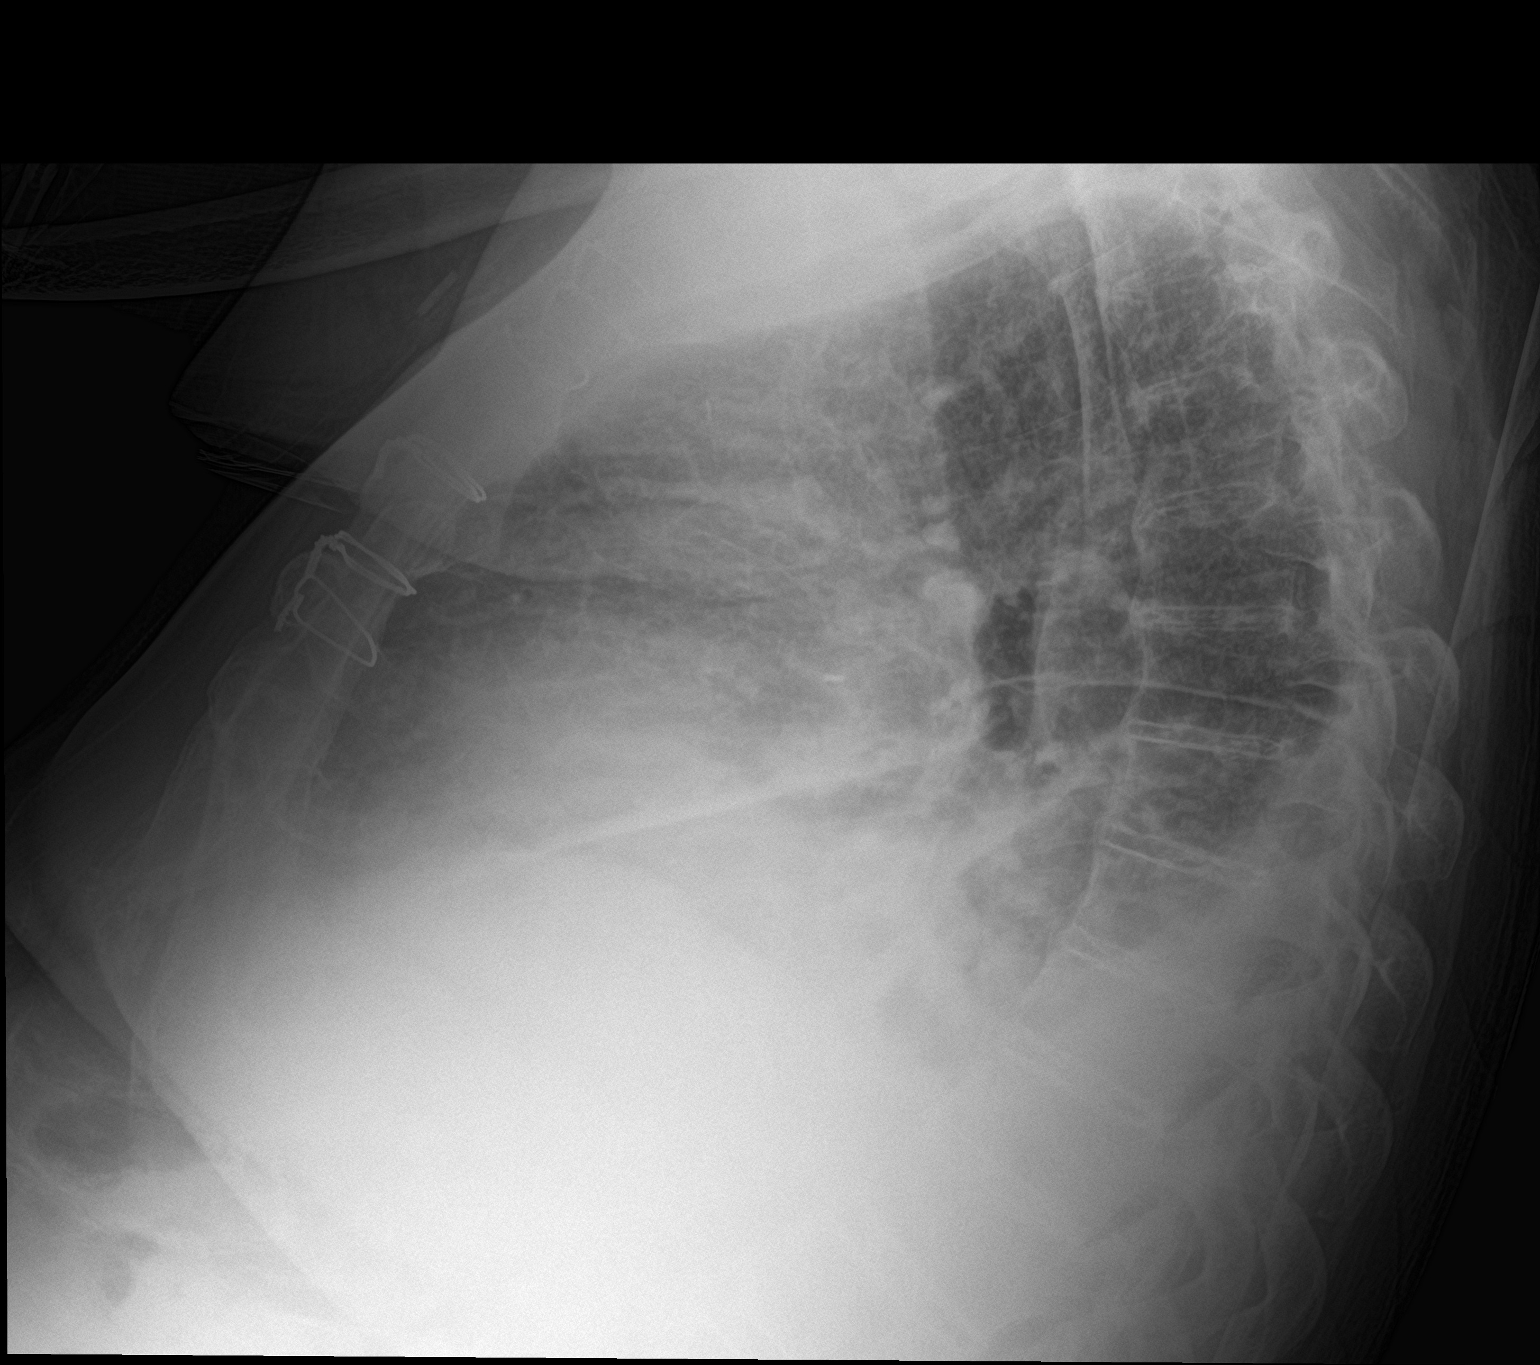

[chest ap]
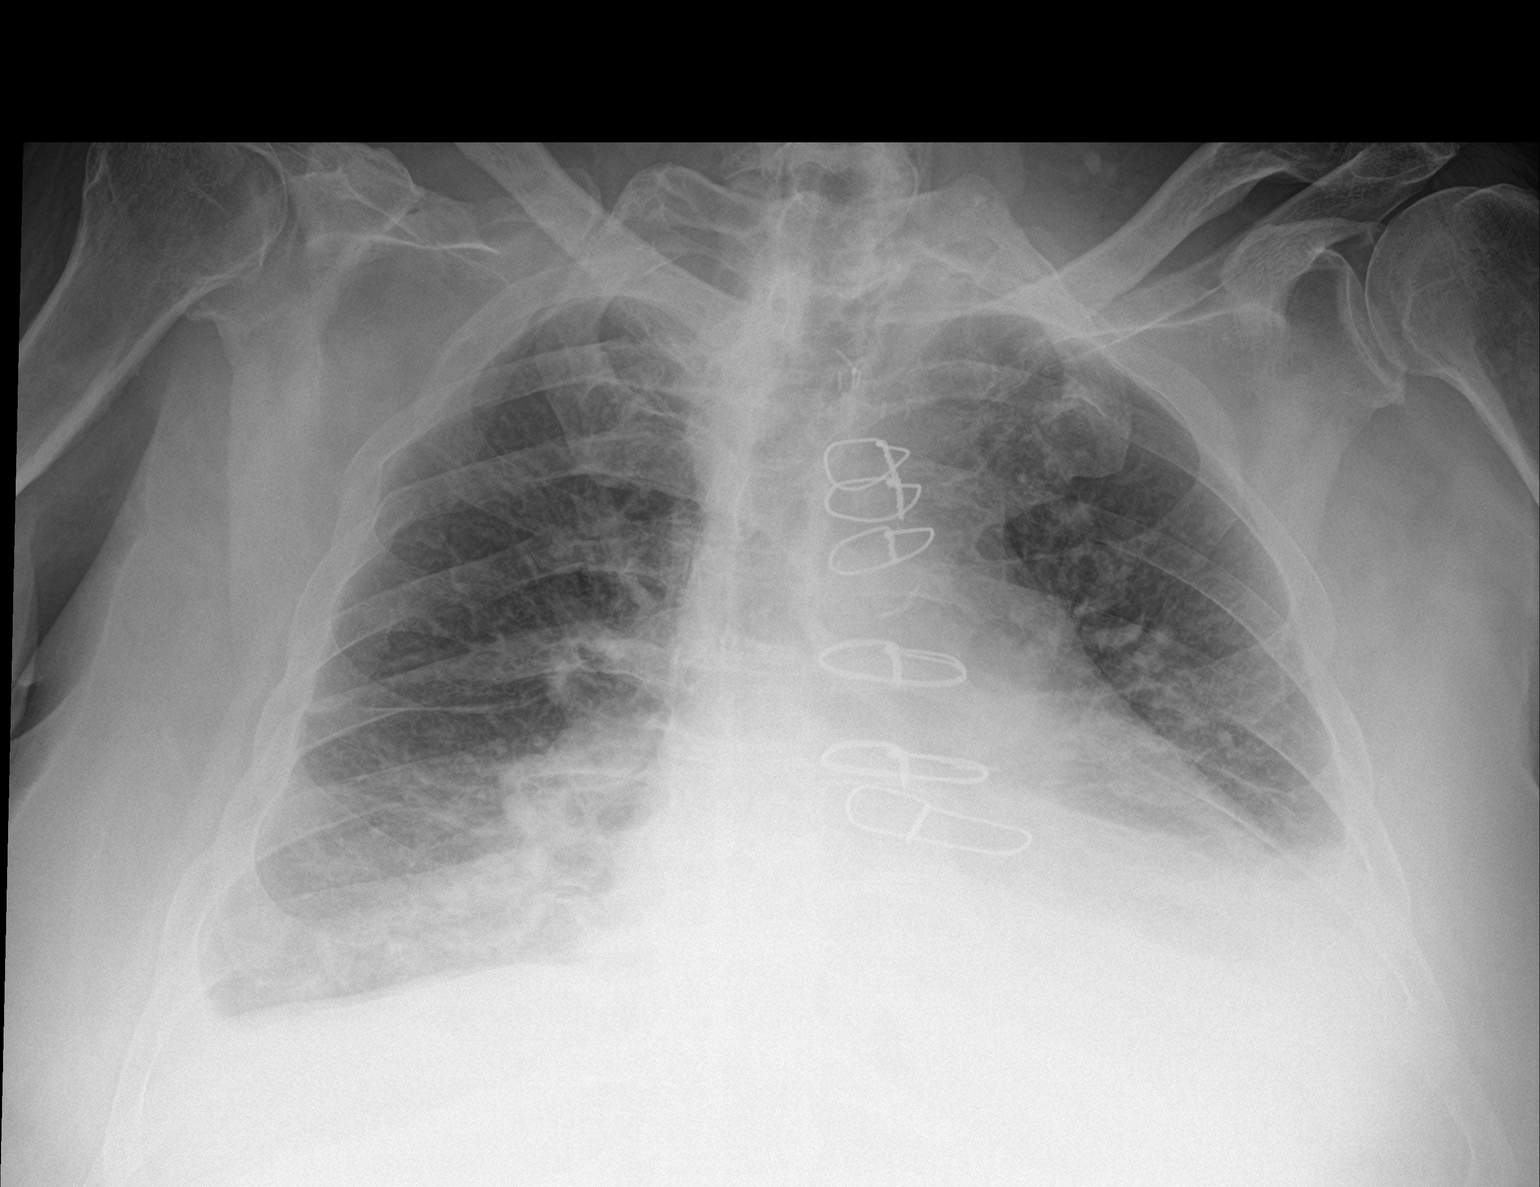

[2 of 2 positions shown; findings below may reference images not displayed]

FINDINGS: Stable enlargement of the cardiac silhouette with median sternotomy
wires. Persistent densities at the left lung base compatible with
pleural fluid and atelectasis. Increased densities at the right lung
base and suggestive for pleural fluid with atelectasis or airspace
disease. Negative for a pneumothorax. No acute bone abnormality.
IMPRESSION: Bibasilar chest densities compatible with bilateral pleural
effusions with atelectasis or airspace disease. Basilar chest
densities have progressed, particularly on the right side compared
to 04/17/2018.

## 2019-10-28 IMAGING — CT CT CERVICAL SPINE W/O CM
3 of 5 series · 15 of 33 positions shown, 17 images · non-contrast
Comparison: 03/12/2015

CLINICAL DATA: Fall transitioning from bed to wheelchair. Head
trauma.

EXAM:
CT HEAD WITHOUT CONTRAST
CT CERVICAL SPINE WITHOUT CONTRAST
TECHNIQUE: Multidetector CT imaging of the head and cervical spine was
performed following the standard protocol without intravenous
contrast. Multiplanar CT image reconstructions of the cervical spine
were also generated.

[Series 5: coronal soft tissue · coronal · 0.31mm/px · 3 of 78 slices shown]
[im 16/78  bone]
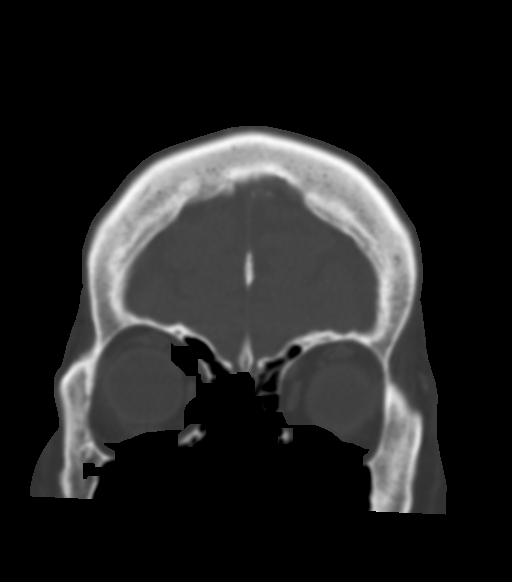
[im 31/78  bone]
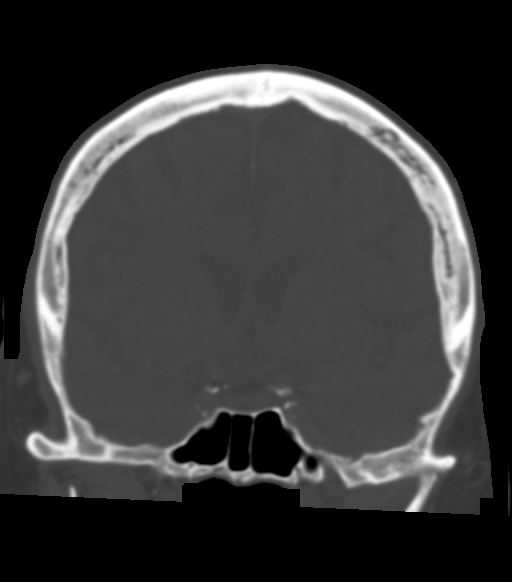
[im 47/78  bone]
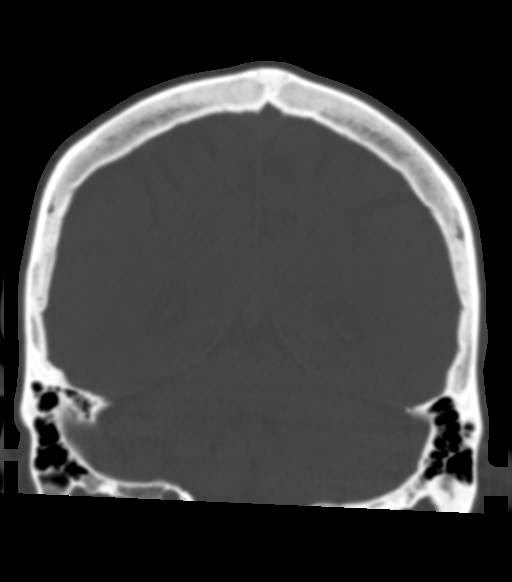

[Series 8: c spine soft · axial · 0.52mm/px · z∈[-206,-66]mm · 8 of 91 slices shown, 10 images]
[im 11/91  soft-tissue]
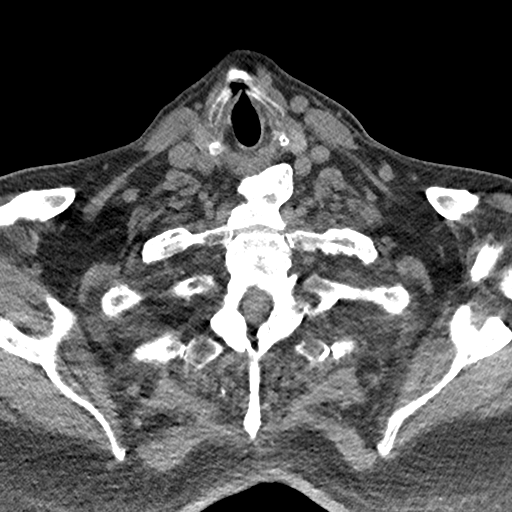
[im 11/91  bone]
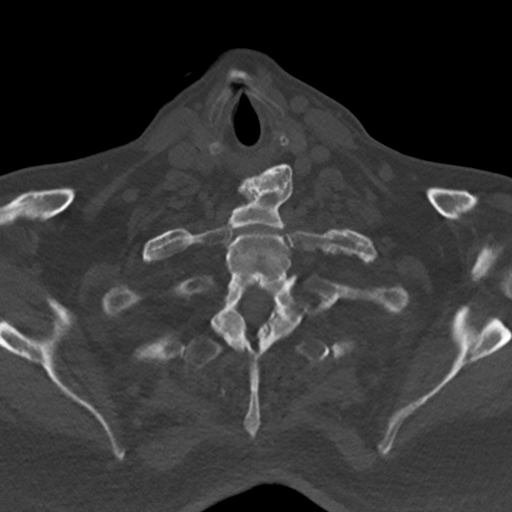
[im 21/91  bone]
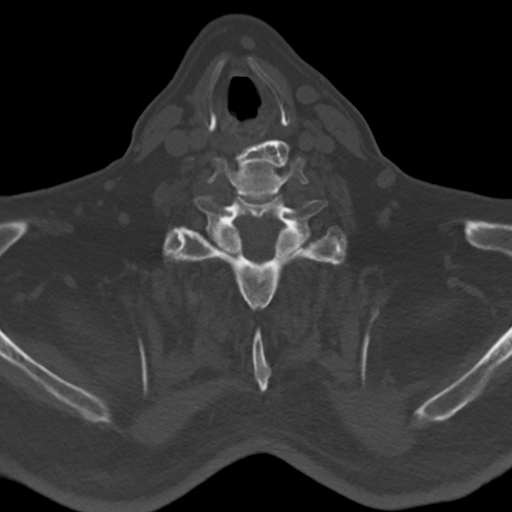
[im 31/91  bone]
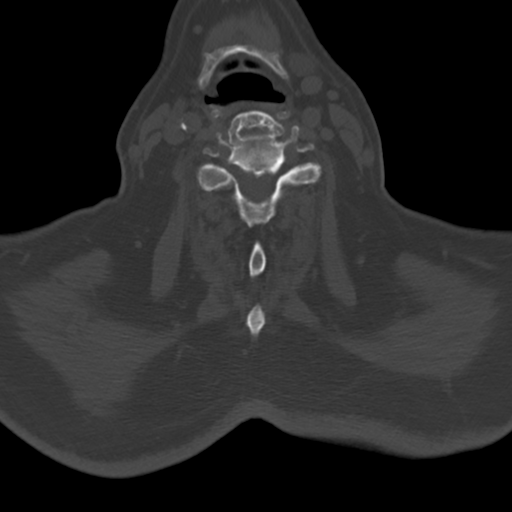
[im 41/91  bone]
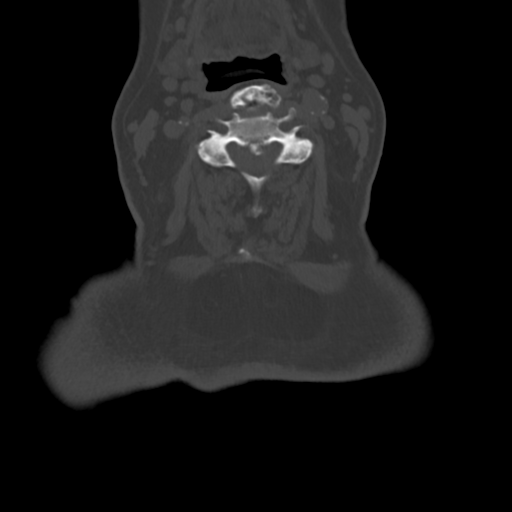
[im 51/91  soft-tissue]
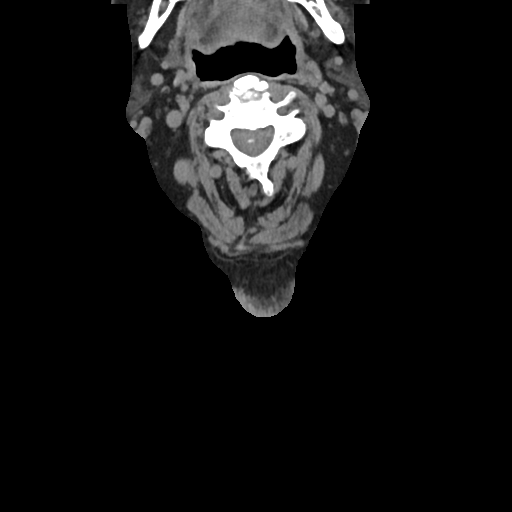
[im 51/91  bone]
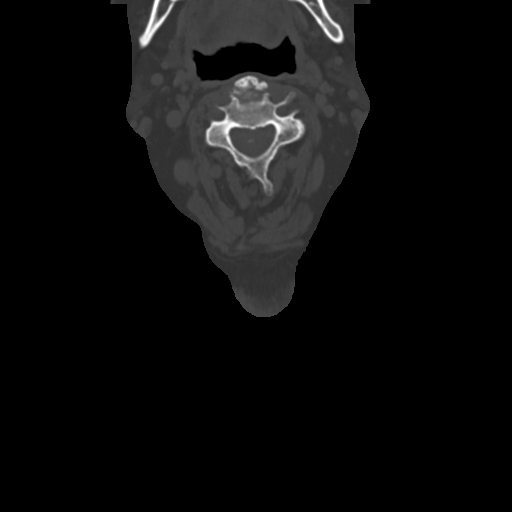
[im 61/91  bone]
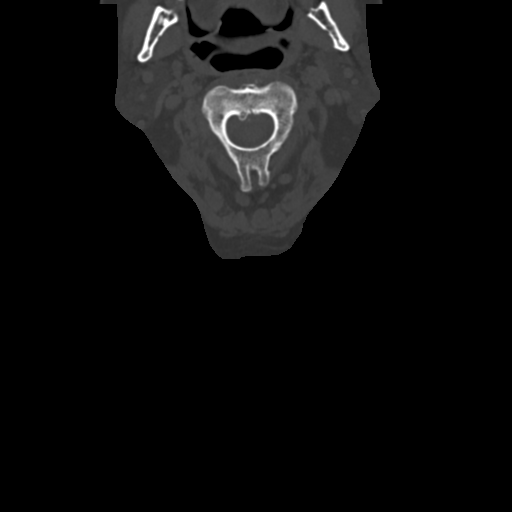
[im 71/91  bone]
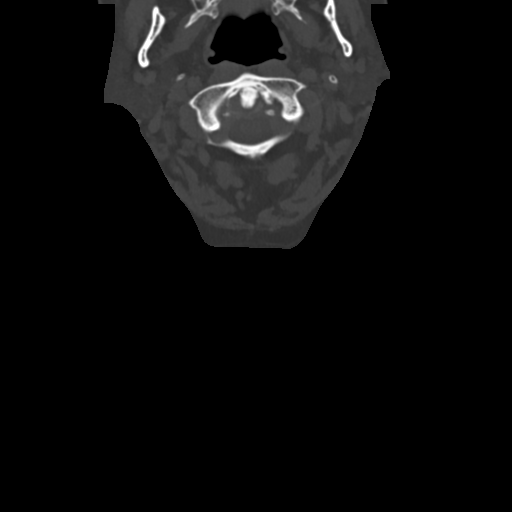
[im 81/91  bone]
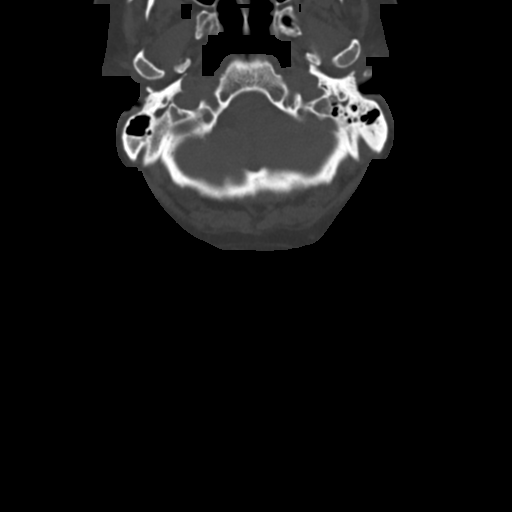

[Series 9: sagittal bone · sagittal · 0.34mm/px · 4 of 61 slices shown]
[im 13/61  bone]
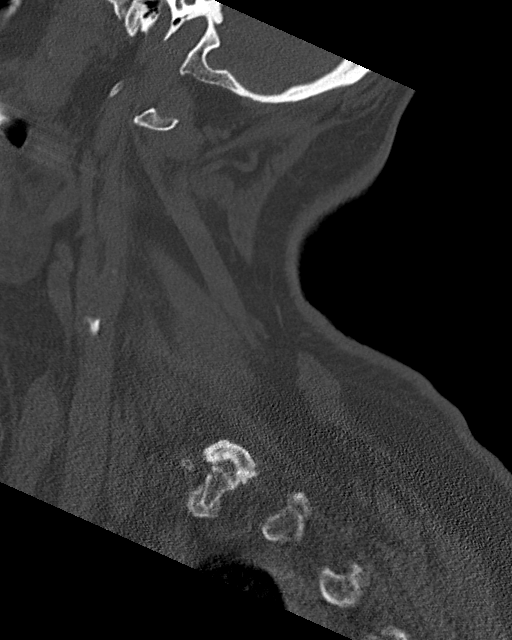
[im 25/61  bone]
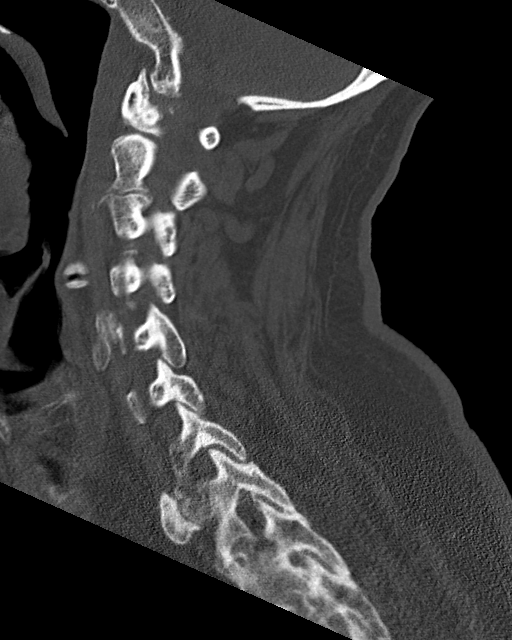
[im 37/61  bone]
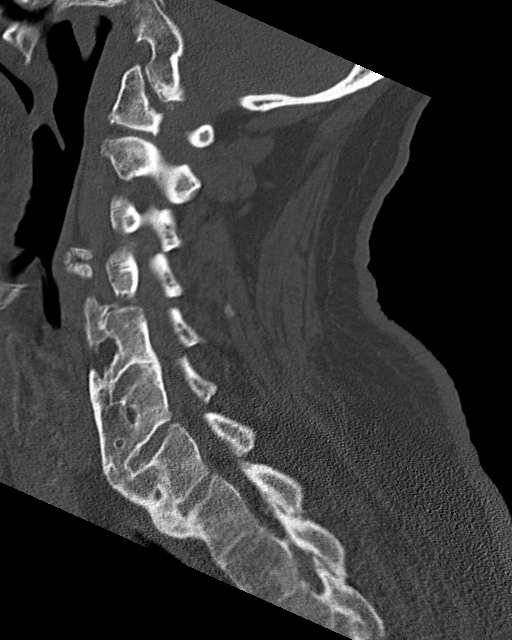
[im 49/61  bone]
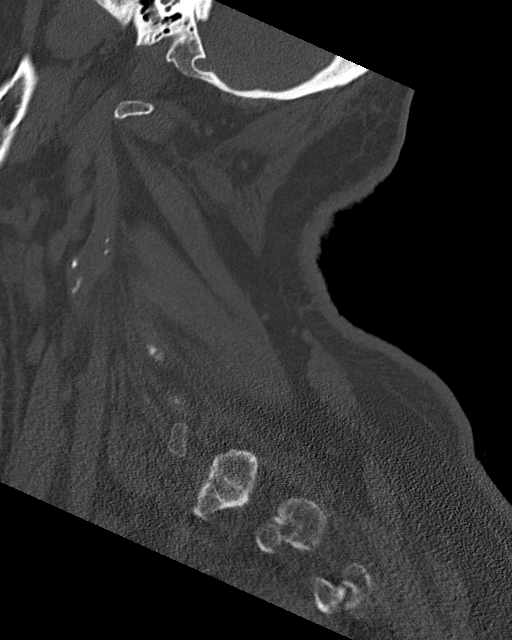

[15 of 33 positions shown; findings below may reference images not displayed]

FINDINGS: CT HEAD FINDINGS

Brain: 4 mm colloid cyst on image [DATE] without ventricular
dilatation.

Otherwise, the brainstem, cerebellum, cerebral peduncles, thalami,
basal ganglia, basilar cisterns, and ventricular system appear
within normal limits. No intracranial hemorrhage, mass lesion, or
acute CVA.

Vascular: Atherosclerotic calcification of the left vertebral artery
and of the cavernous carotid arteries.

Skull: Unremarkable

Sinuses/Orbits: Unremarkable

Other: No supplemental non-categorized findings.

CT CERVICAL SPINE FINDINGS

Alignment: No vertebral subluxation is observed.

Skull base and vertebrae: Extensive spur formation in the cervical
spine with continuous bridging spurring at C4-C5-C6-C7-T1, and non
continuous spurring anteriorly at C2-3 and C3-4 with chronic
fragmentation. There is bridging spurring of the left C4-5 facet
joint. No fracture observed.

Soft tissues and spinal canal: Atherosclerotic calcification of the
common carotid arteries.

Disc levels: Severe central narrowing of the thecal sac at C3-4 due
to posterior intervertebral spurring as on image 32/9, with
resulting prominent central narrowing of the thecal sac

Right foraminal impingement due to spurring at C2-3 and possibly
C4-5.

Upper chest: Suspected pleural effusions of the lung apices.

Other: No supplemental non-categorized findings.
IMPRESSION: 1. No acute intracranial findings or acute cervical spine findings.
2. Suspected bilateral pleural effusions, dedicated chest
radiography recommended.
3. Chronic 4 mm colloid cyst slightly eccentric to the right along
the third ventricle, without ventricular dilatation.
4. Diffuse idiopathic skeletal hyperostosis with prominent spurring
in the cervical spine. There is severe central narrowing of the
thecal sac at C3-4 due to a large posterior intervertebral spur, as
well as suspected right foraminal impingement at C2-3 and C4-5.
5. Atherosclerosis.

## 2019-10-28 IMAGING — DX DG HIP (WITH OR WITHOUT PELVIS) 2-3V*R*
3 series · 3 of 3 positions shown · non-contrast
Comparison: 01/16/2018

CLINICAL DATA: Fall this morning with right hip pain.

EXAM:
DG HIP (WITH OR WITHOUT PELVIS) 2-3V RIGHT

[pelvis ap]
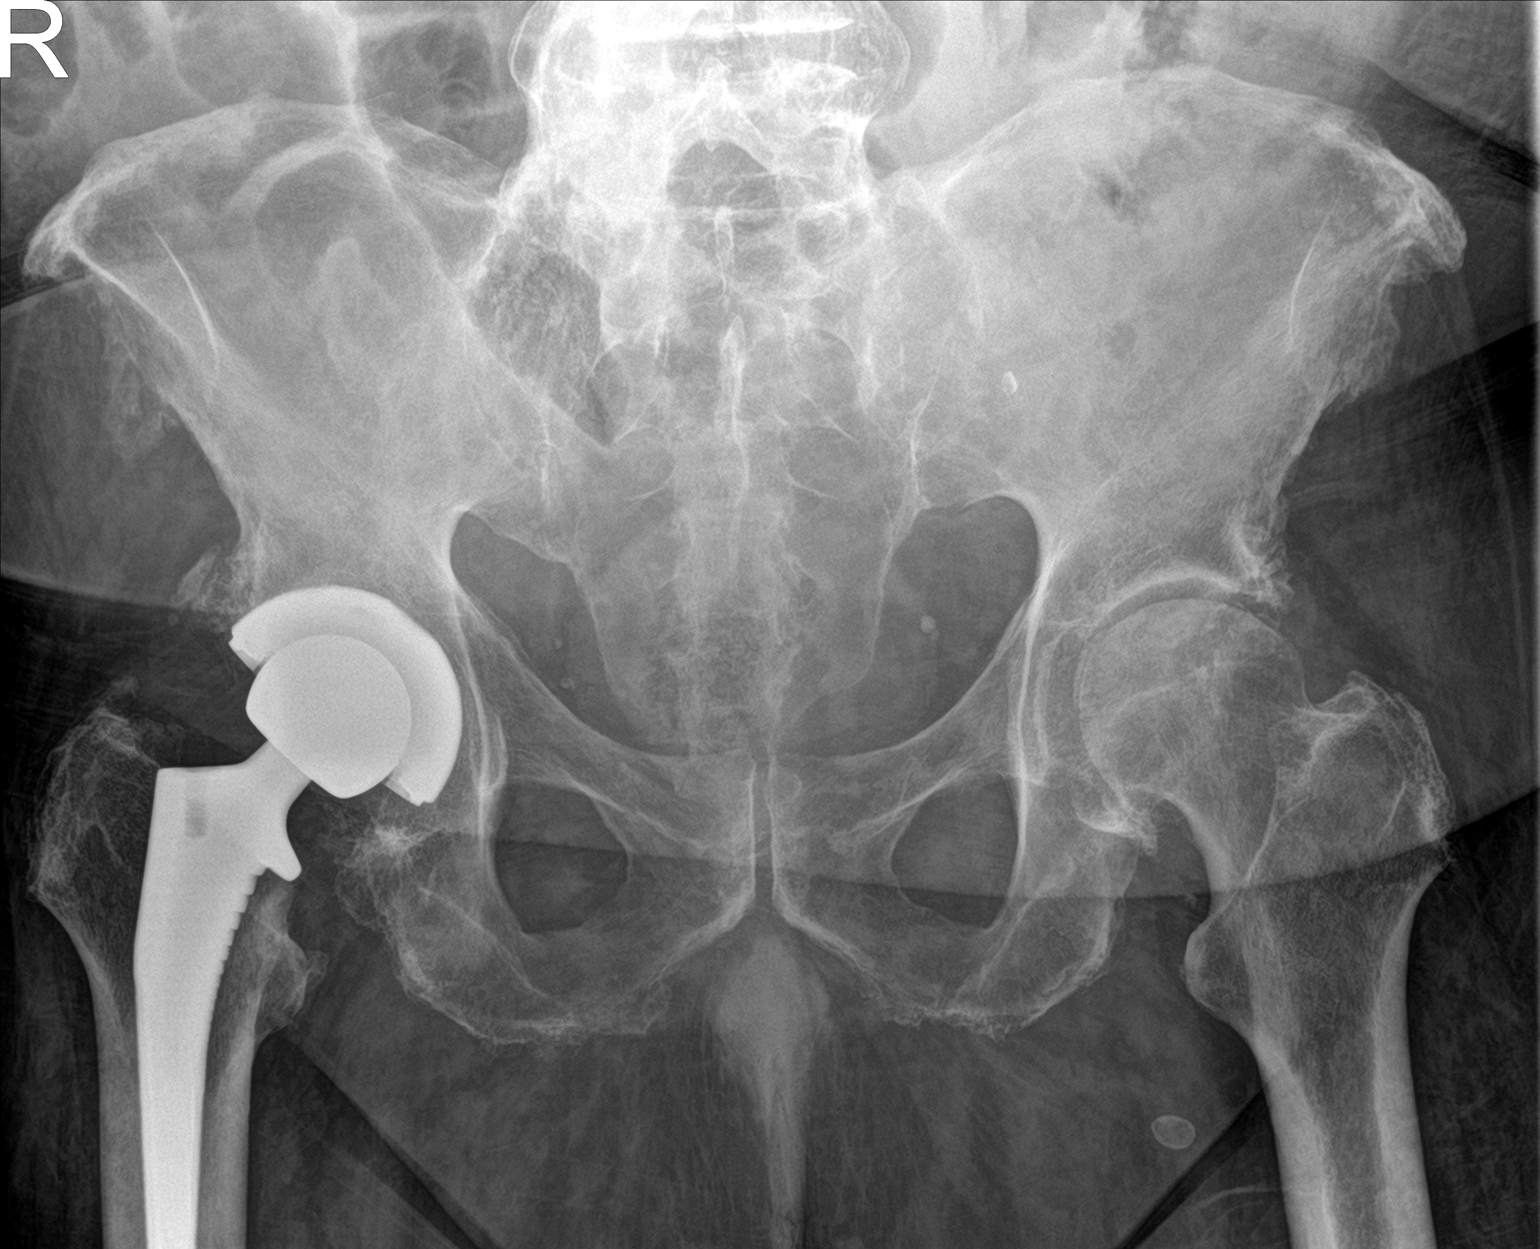

[hip ap]
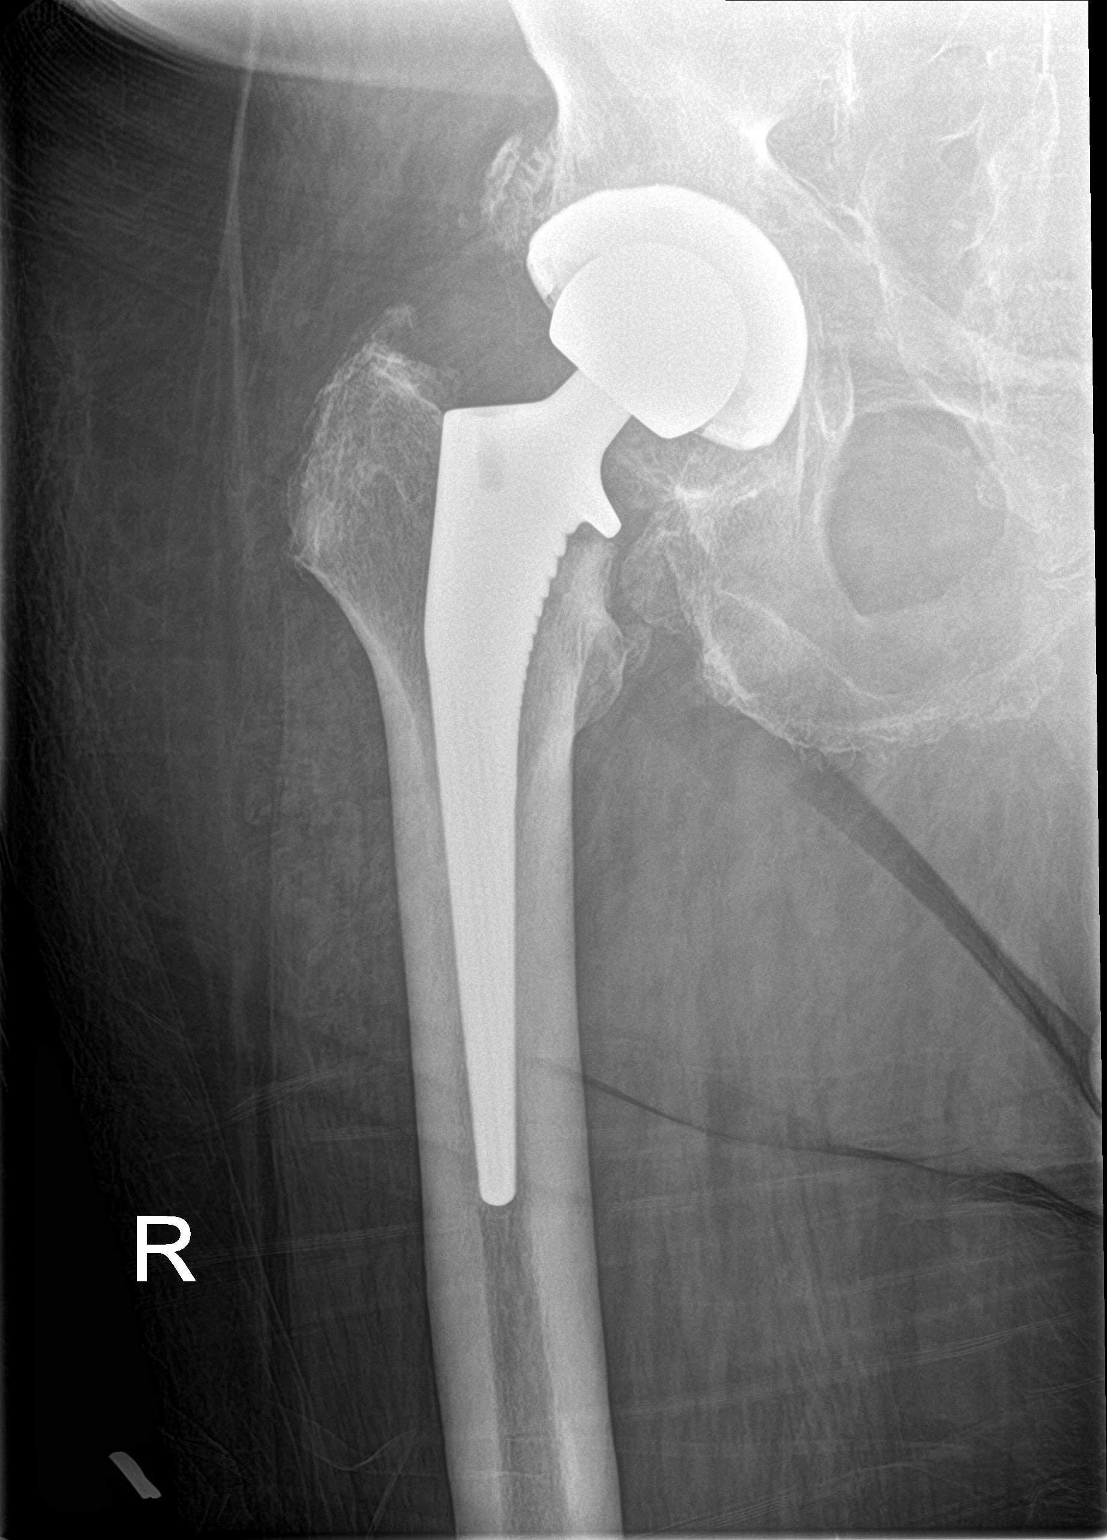

[hip lat]
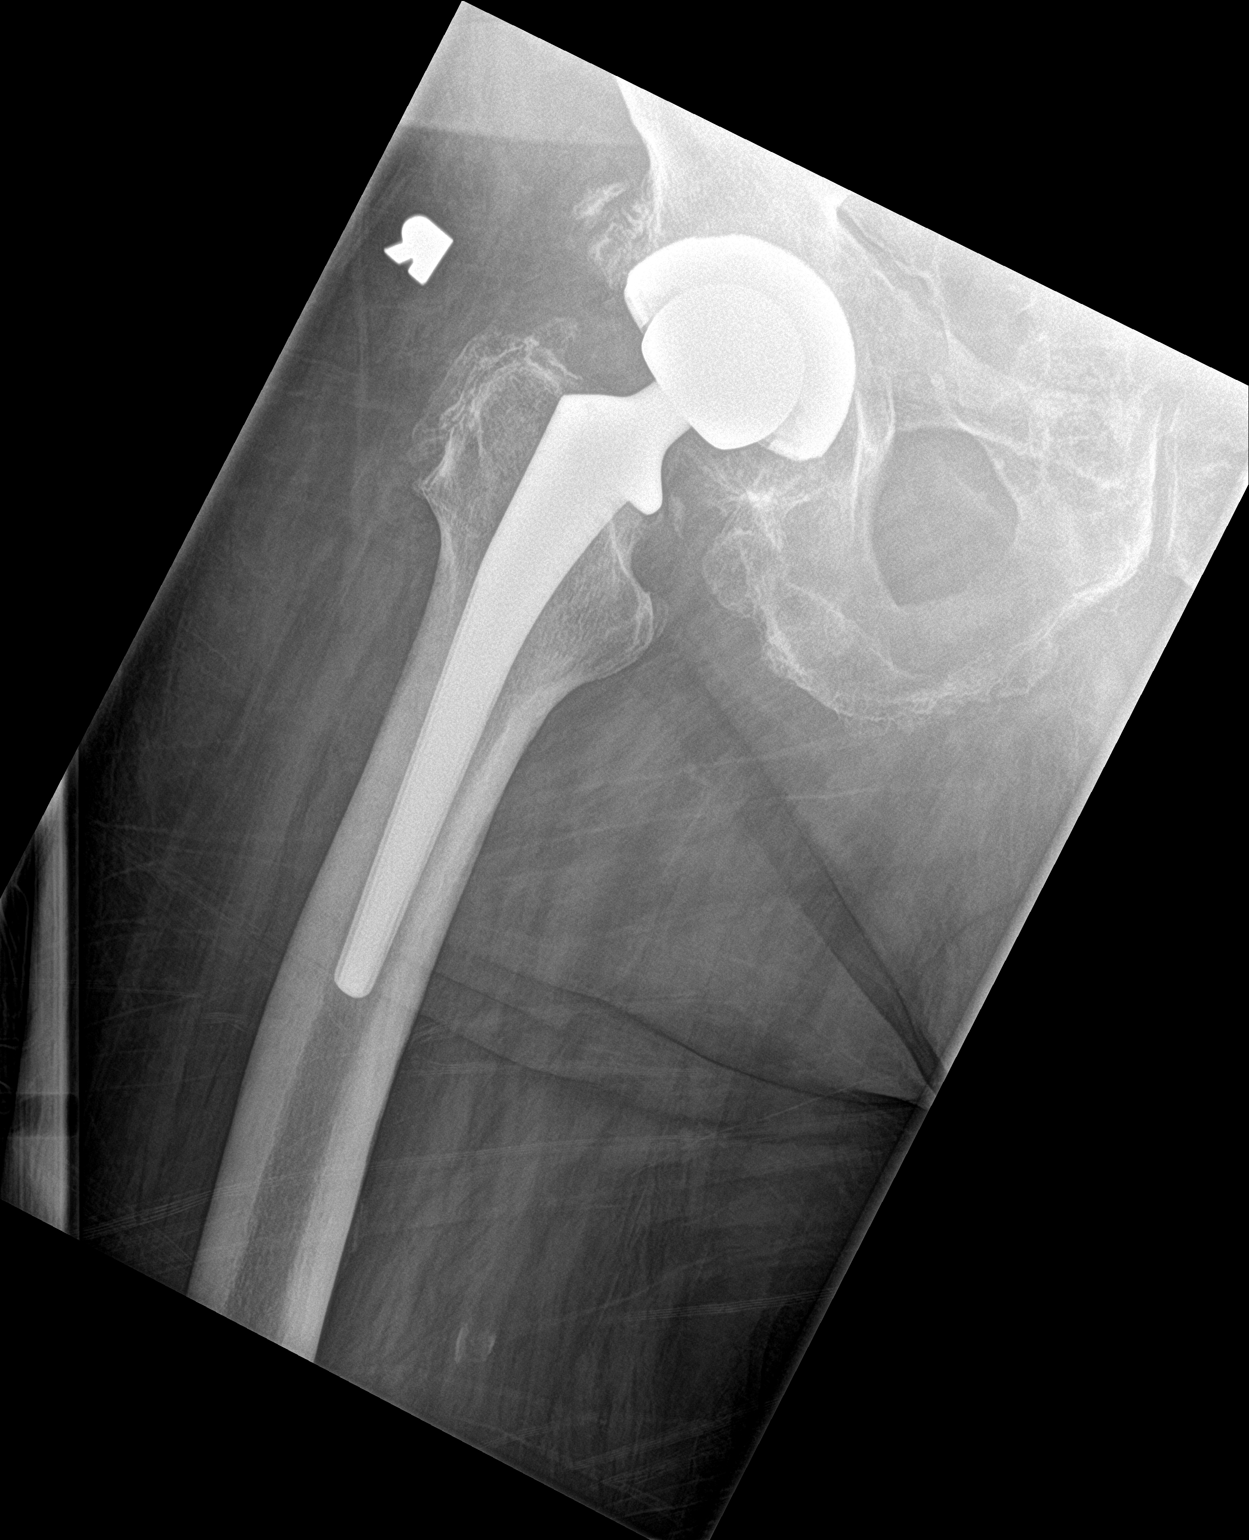

[3 of 3 positions shown; findings below may reference images not displayed]

FINDINGS: Right total hip arthroplasty intact and normally located. No
evidence of acute fracture or dislocation. Degenerative change of
the left hip. Degenerative change of the spine. Mild diffuse
osteopenia.
IMPRESSION: No acute findings.

Right total hip arthroplasty intact and unchanged.

## 2019-10-28 IMAGING — DX DG WRIST COMPLETE 3+V*R*
3 series · 3 of 3 positions shown · non-contrast
Comparison: None.

CLINICAL DATA: Fall this morning with right wrist pain.

EXAM:
RIGHT WRIST - COMPLETE 3+ VIEW

[wrist pa]
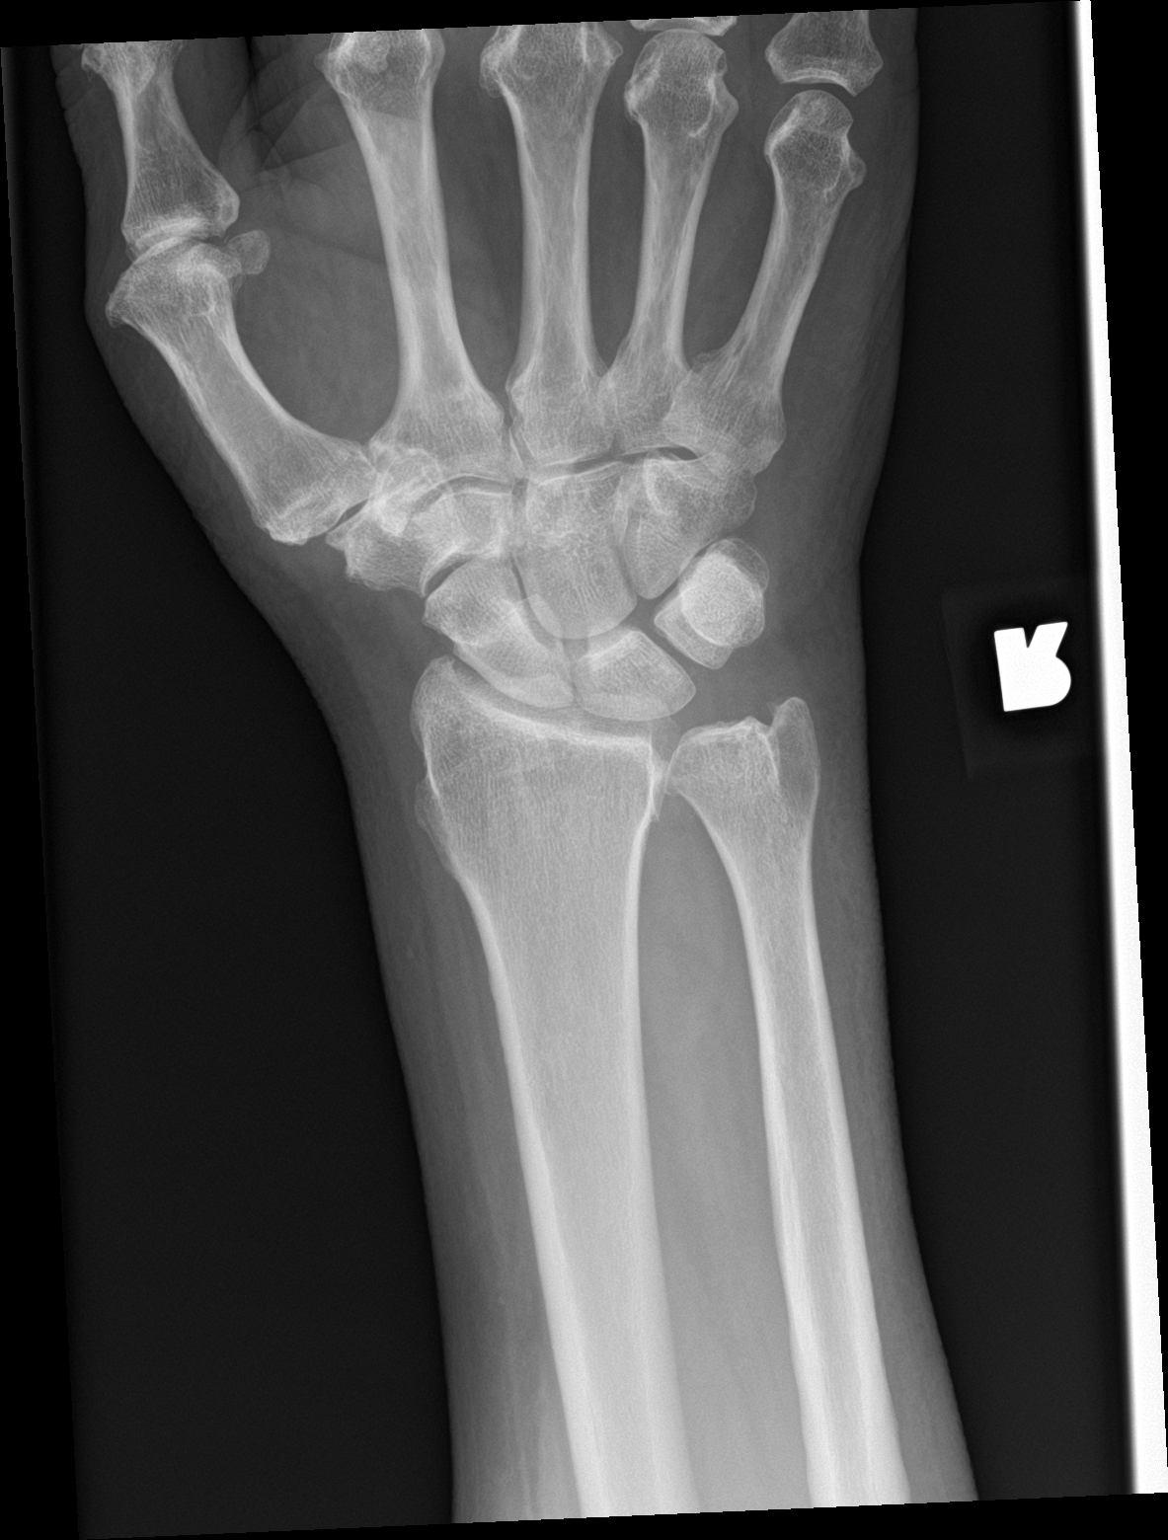

[wrist obl]
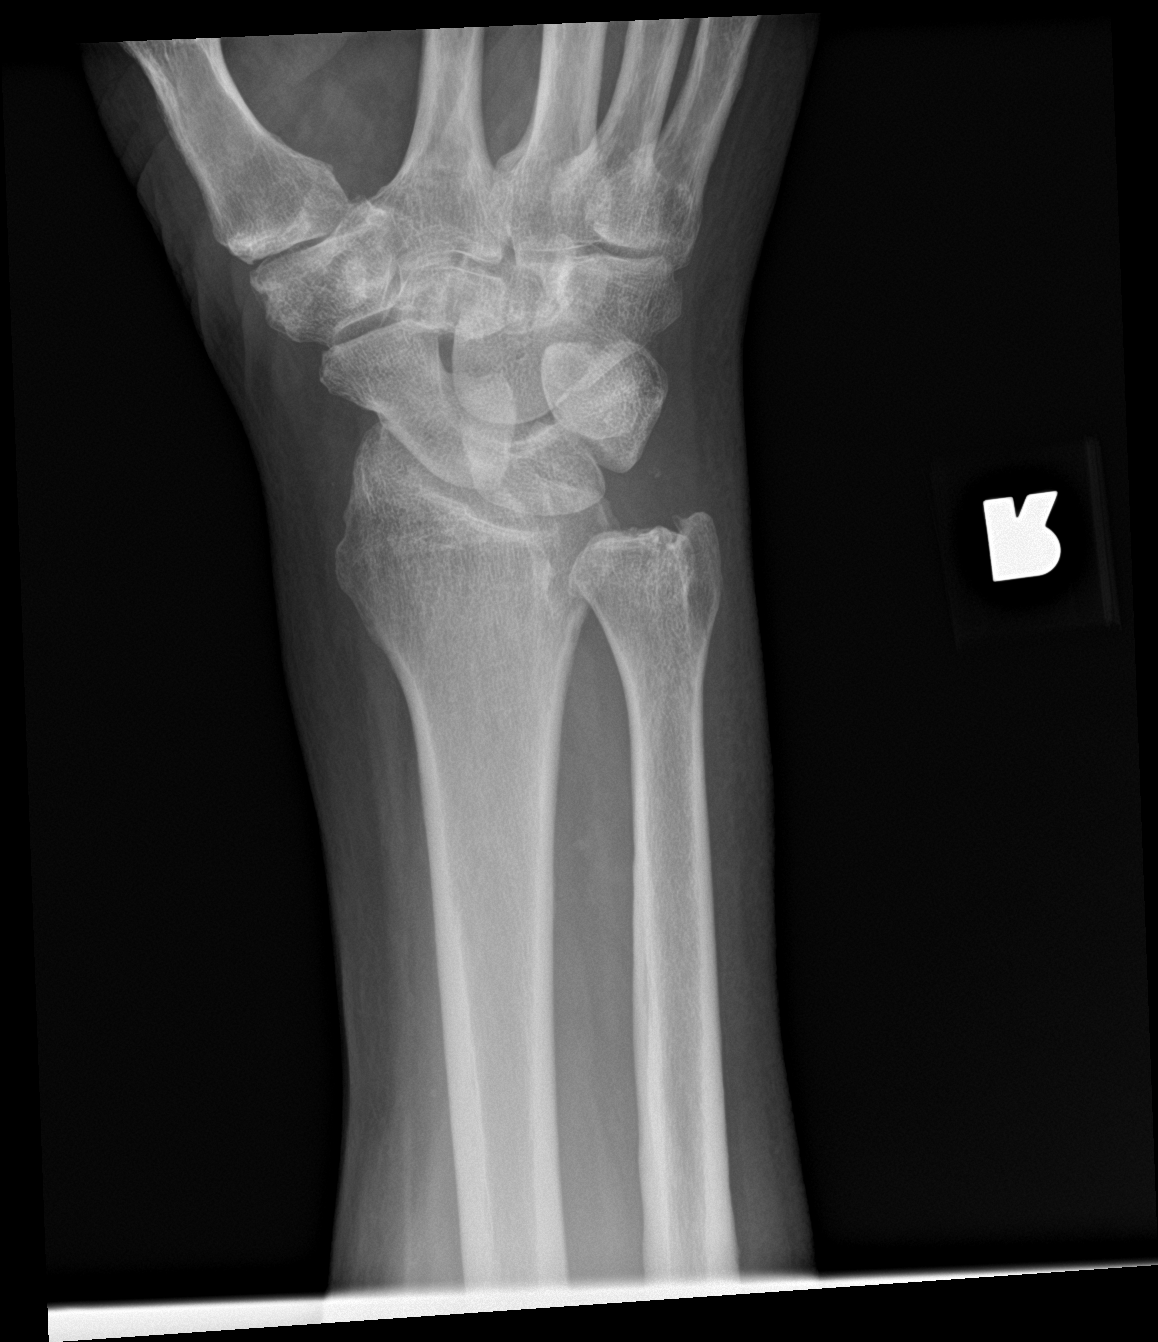

[wrist lat]
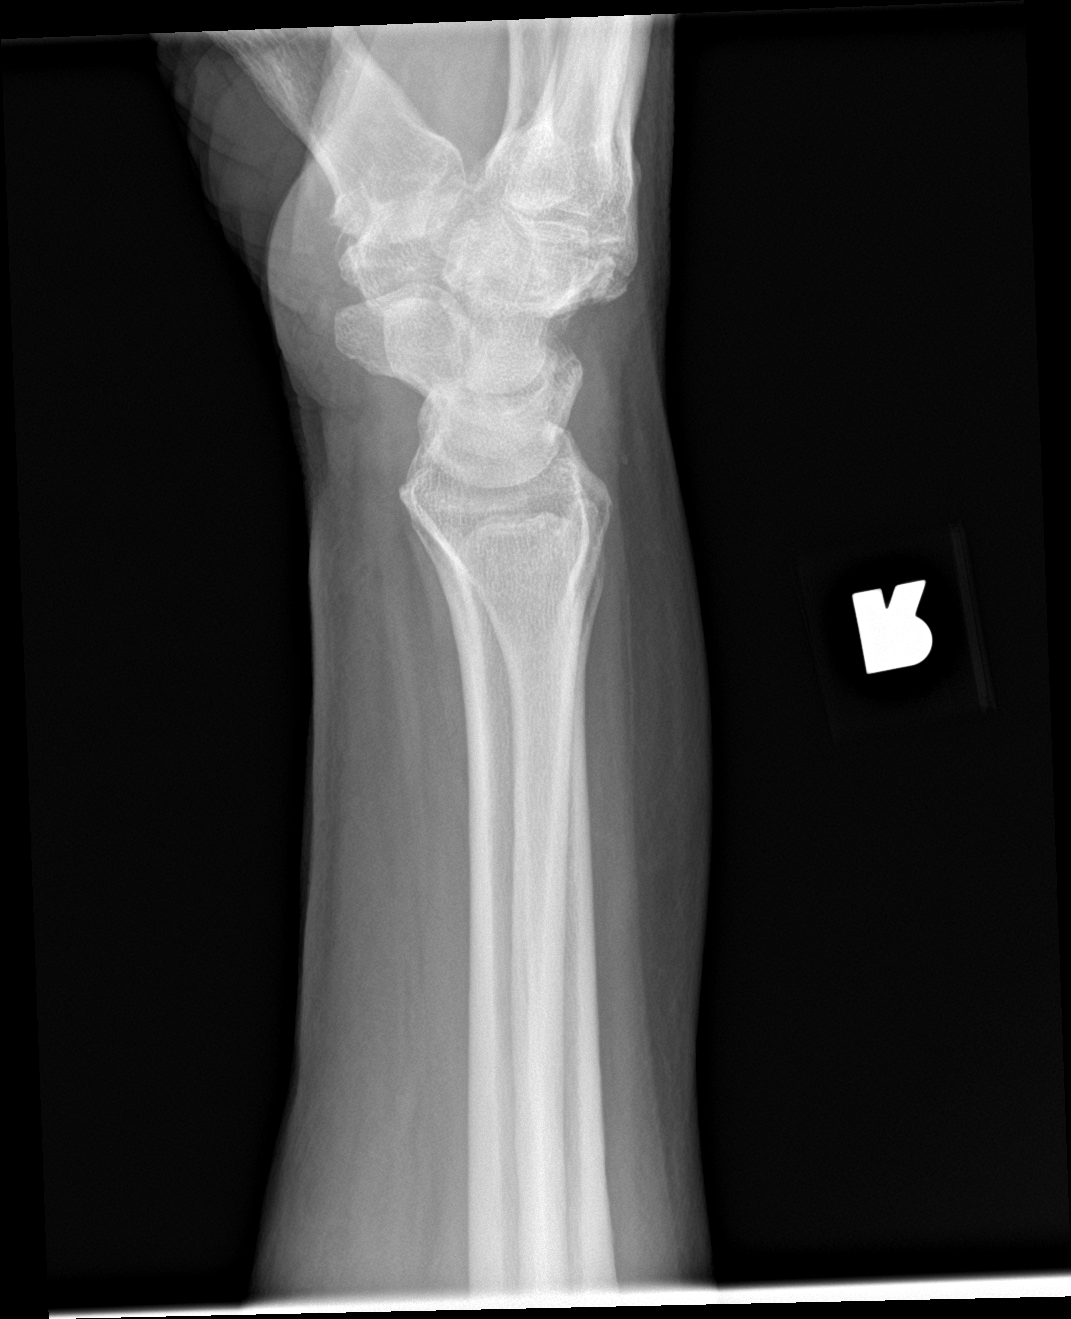

[3 of 3 positions shown; findings below may reference images not displayed]

FINDINGS: Mild degenerate changes over the radiocarpal joint and carpal bones
as well as first carpometacarpal joint and first MCP joints. No
evidence of acute fracture or dislocation.
IMPRESSION: No acute findings.

## 2019-10-28 IMAGING — DX DG ELBOW COMPLETE 3+V*R*
4 series · 4 of 4 positions shown · non-contrast
Comparison: None.

CLINICAL DATA: Fall this morning with right elbow pain.

EXAM:
RIGHT ELBOW - COMPLETE 3+ VIEW

[elbow ap]
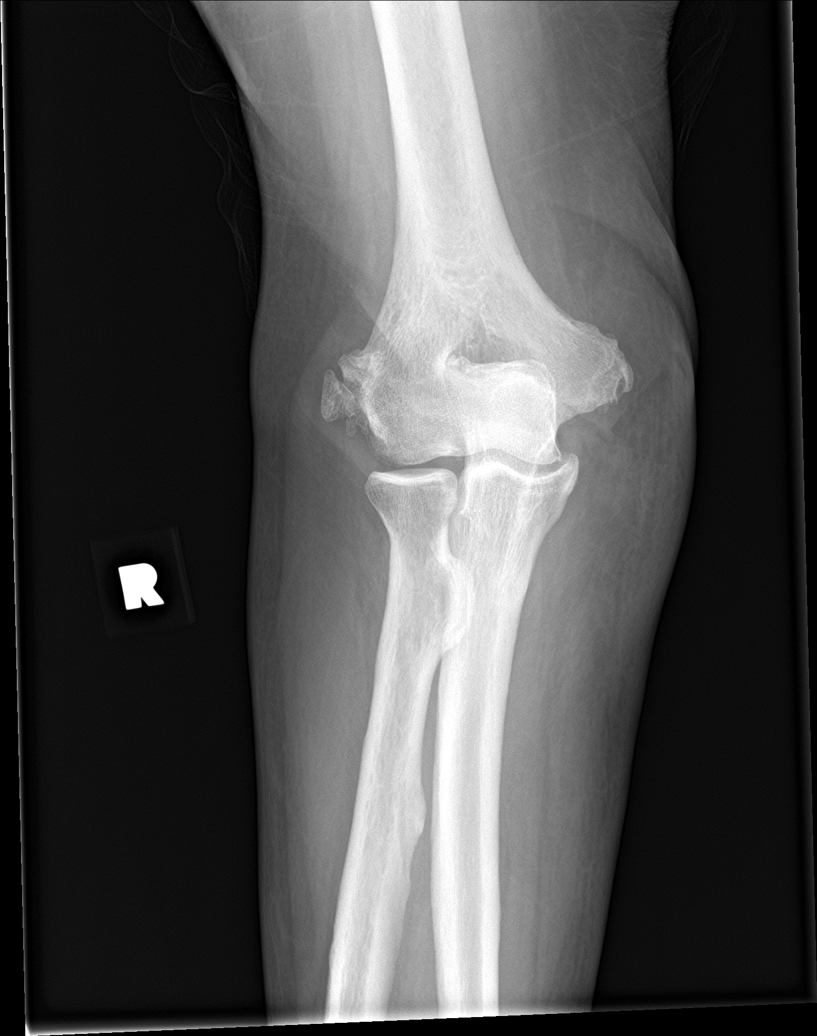

[elbow obl (1 of 2)]
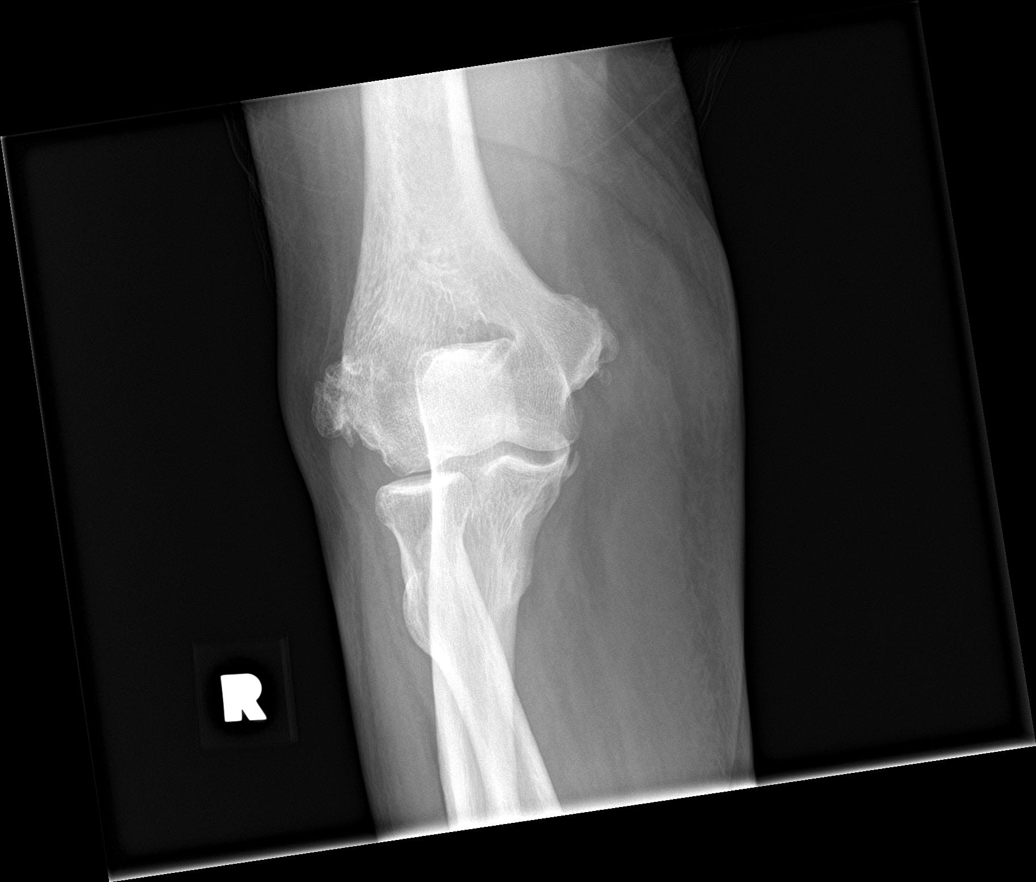

[elbow lat]
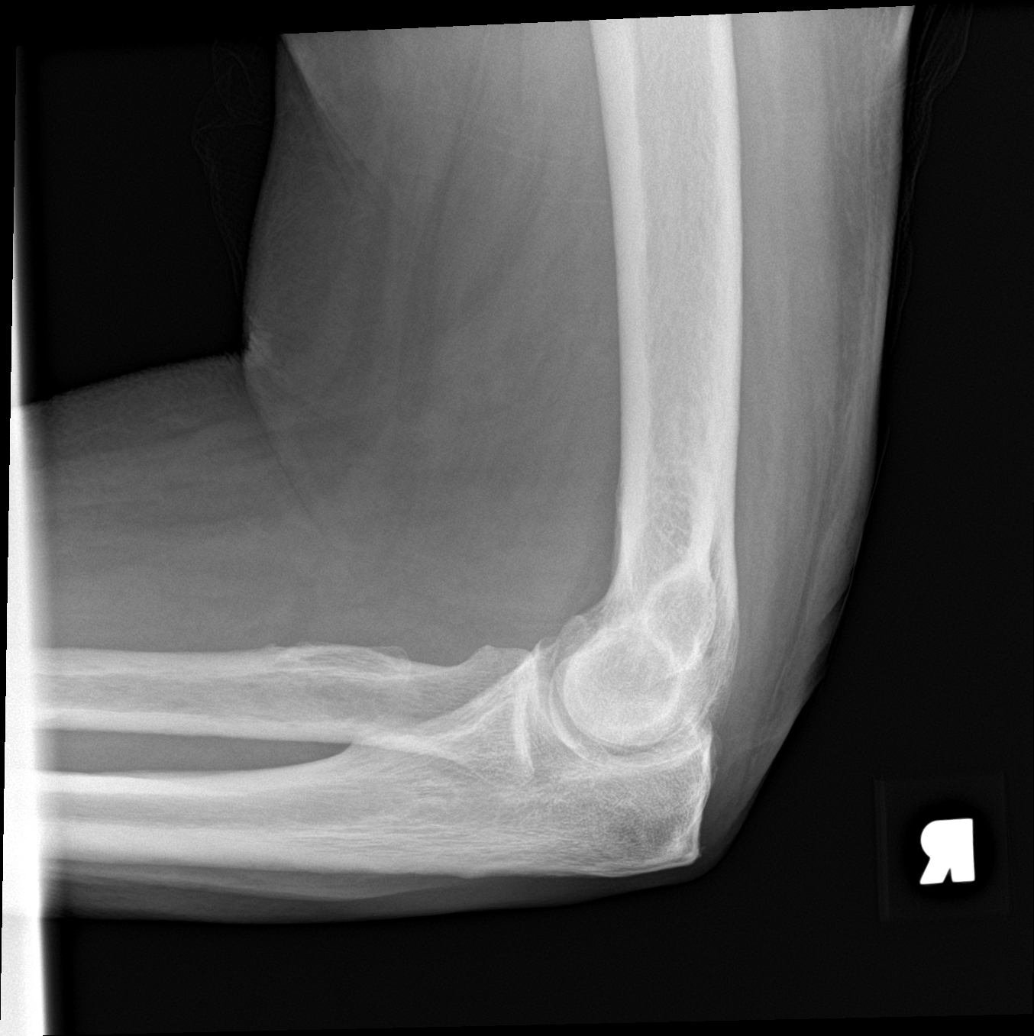

[elbow obl (2 of 2)]
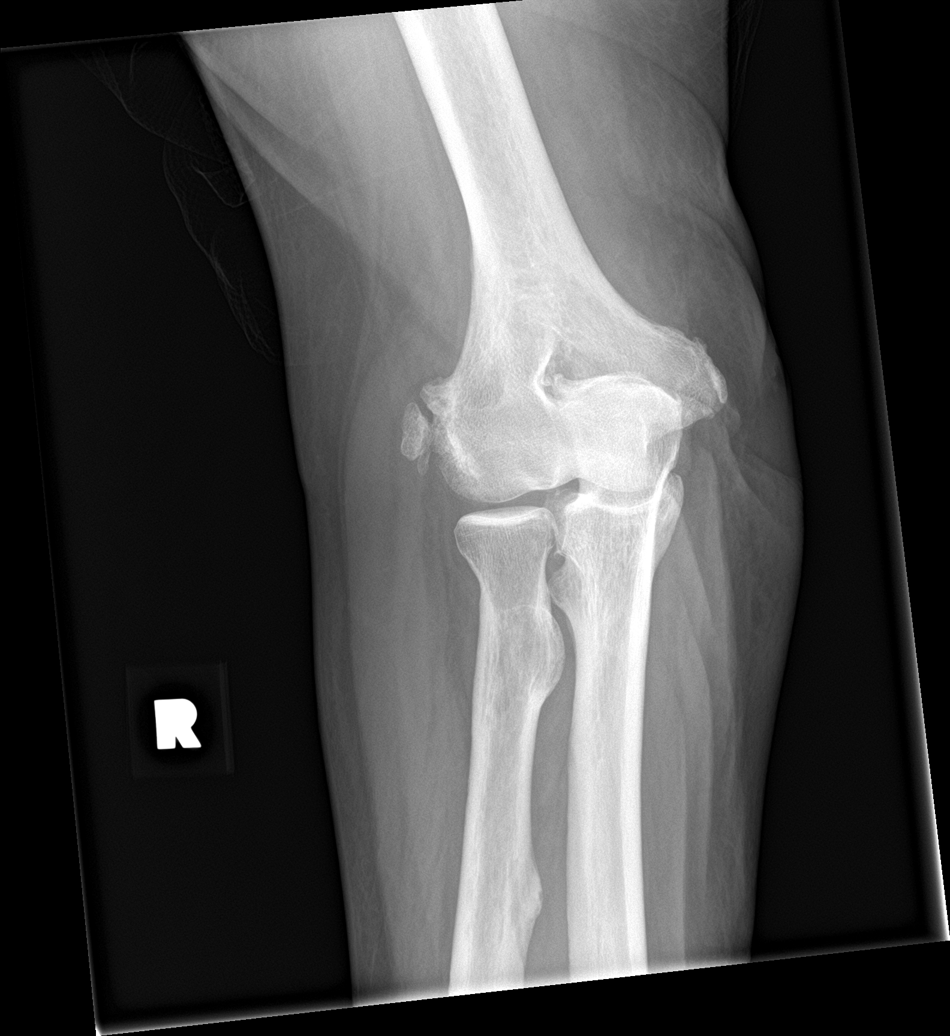

[4 of 4 positions shown; findings below may reference images not displayed]

FINDINGS: Mild degenerate change of the right elbow joint. No evidence of
acute fracture or dislocation.
IMPRESSION: No acute findings.

## 2019-10-28 IMAGING — DX DG SHOULDER 2+V*R*
2 series · 2 of 2 positions shown · non-contrast
Comparison: Chest x-ray 04/12/2018

CLINICAL DATA: Fall this morning with right shoulder pain.

EXAM:
RIGHT SHOULDER - 2+ VIEW

[shoulder grashey]
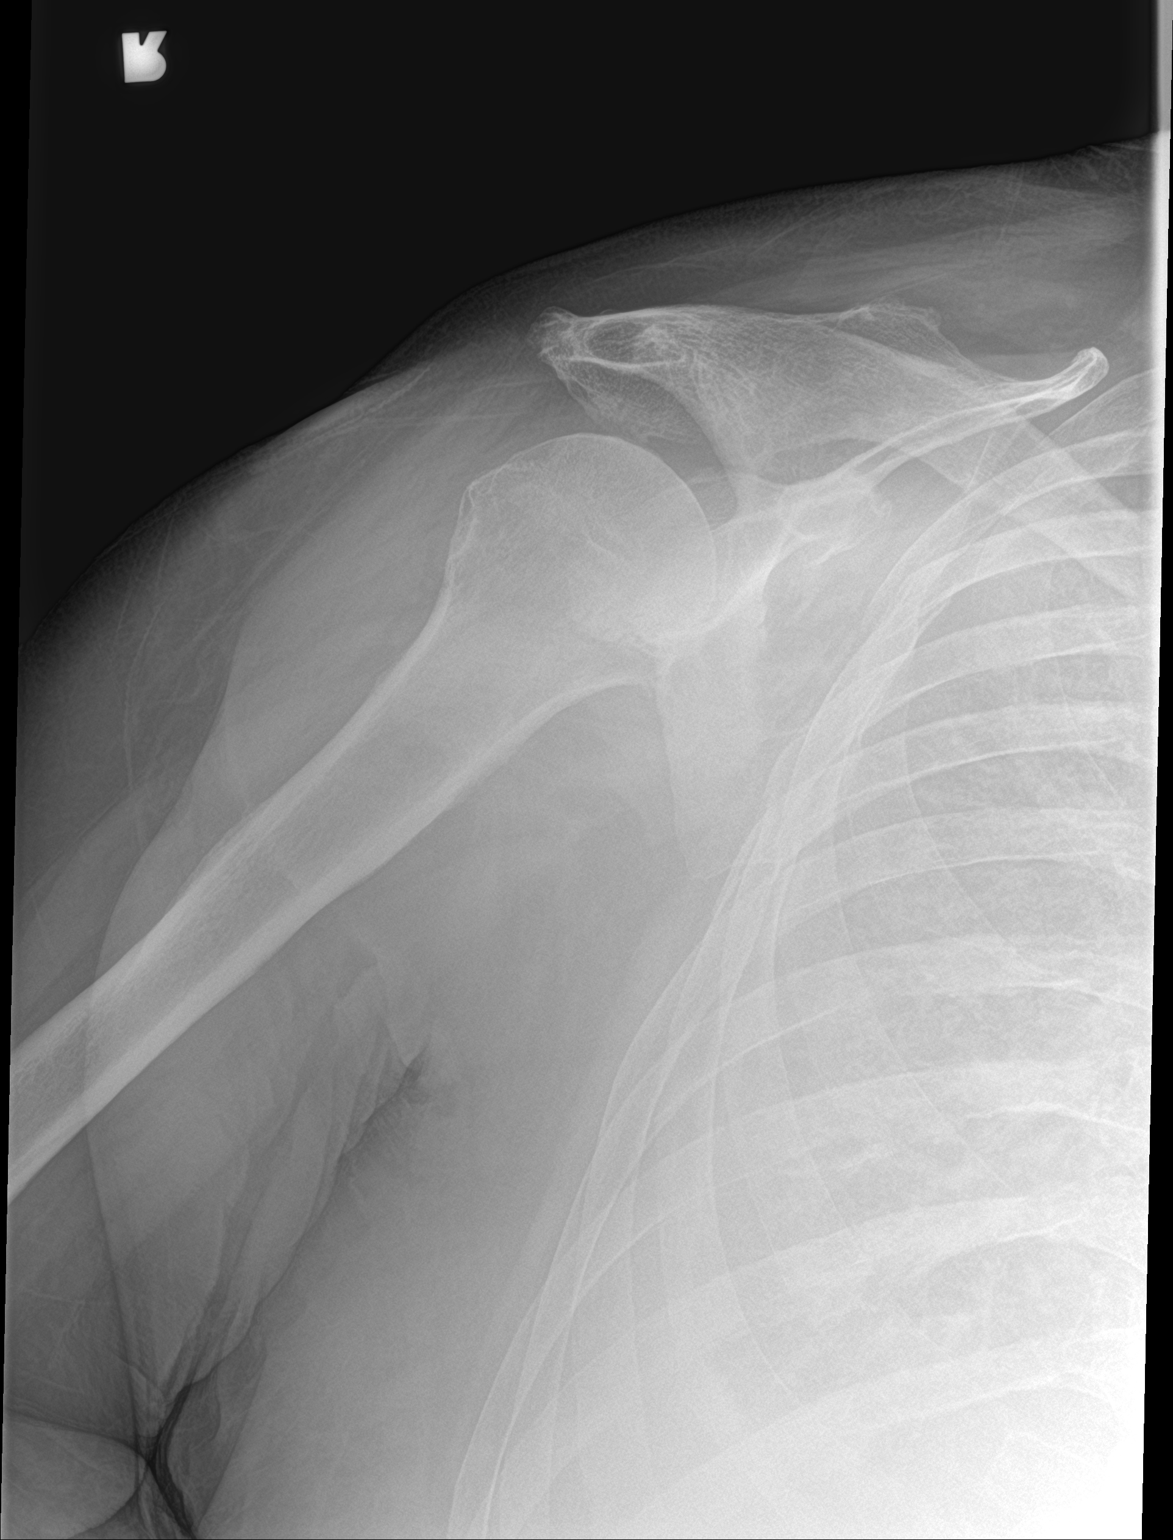

[shoulder y view]
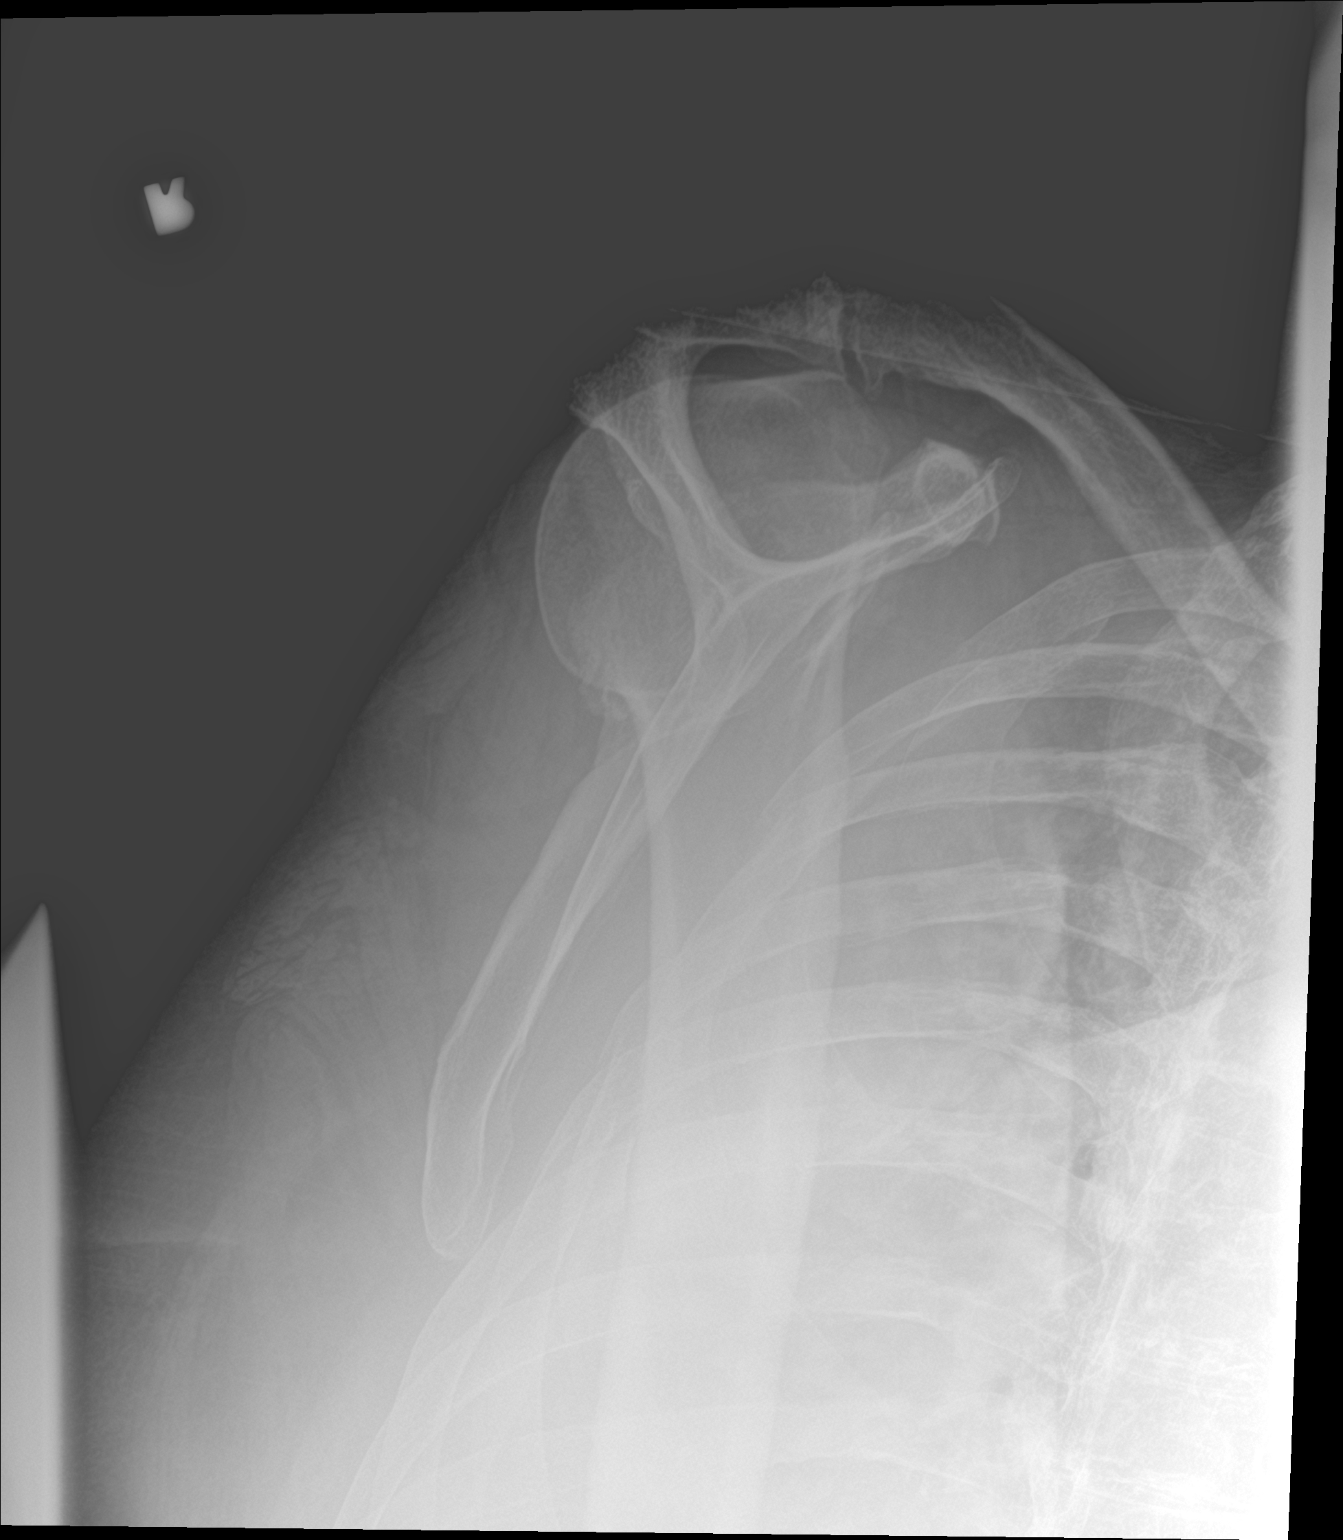

[2 of 2 positions shown; findings below may reference images not displayed]

FINDINGS: Mild degenerative change of the AC joint and glenohumeral joints. No
evidence of acute fracture or dislocation involving the shoulder.
Several old right lateral rib fractures.
IMPRESSION: No acute shoulder injury.

Old right rib fractures.

## 2019-10-29 DIAGNOSIS — R278 Other lack of coordination: Secondary | ICD-10-CM | POA: Diagnosis not present

## 2019-10-29 DIAGNOSIS — I5022 Chronic systolic (congestive) heart failure: Secondary | ICD-10-CM | POA: Diagnosis not present

## 2019-10-29 DIAGNOSIS — R2689 Other abnormalities of gait and mobility: Secondary | ICD-10-CM | POA: Diagnosis not present

## 2019-10-29 DIAGNOSIS — I482 Chronic atrial fibrillation, unspecified: Secondary | ICD-10-CM | POA: Diagnosis not present

## 2019-10-29 DIAGNOSIS — M6281 Muscle weakness (generalized): Secondary | ICD-10-CM | POA: Diagnosis not present

## 2019-10-29 DIAGNOSIS — M4327 Fusion of spine, lumbosacral region: Secondary | ICD-10-CM | POA: Diagnosis not present

## 2019-10-31 IMAGING — CT CT HEAD W/O CM
3 series · 16 of 47 positions shown, 19 images · non-contrast
Comparison: 05/16/2018; 03/12/2015; 08/24/2012

CLINICAL DATA: Recent fall.

EXAM:
CT HEAD WITHOUT CONTRAST
TECHNIQUE: Contiguous axial images were obtained from the base of the skull
through the vertex without intravenous contrast.

[Series 2: head trauma wo · axial · 0.47mm/px · z∈[+140,+275]mm · 10 of 33 slices shown, 13 images]
[im 3/33  brain]
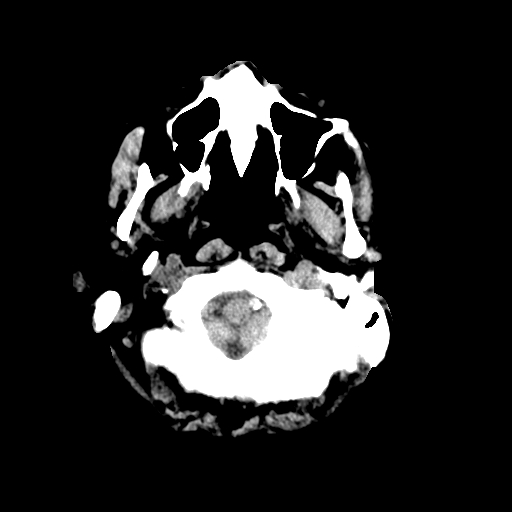
[im 3/33  bone]
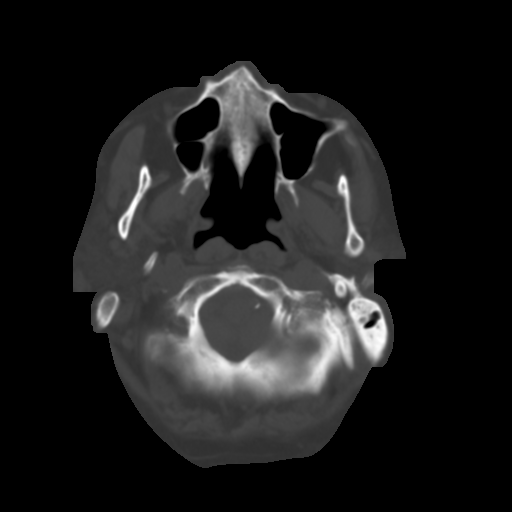
[im 6/33  brain]
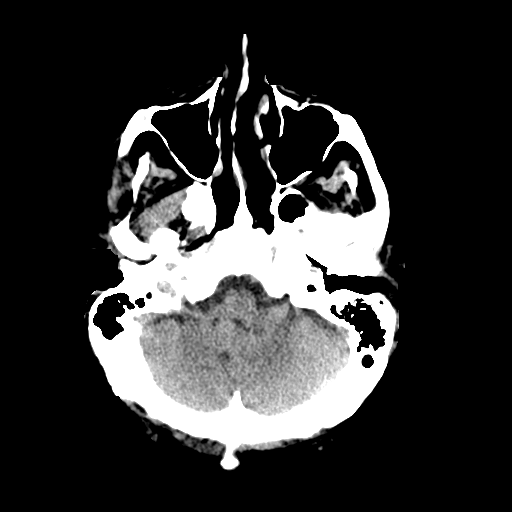
[im 9/33  brain]
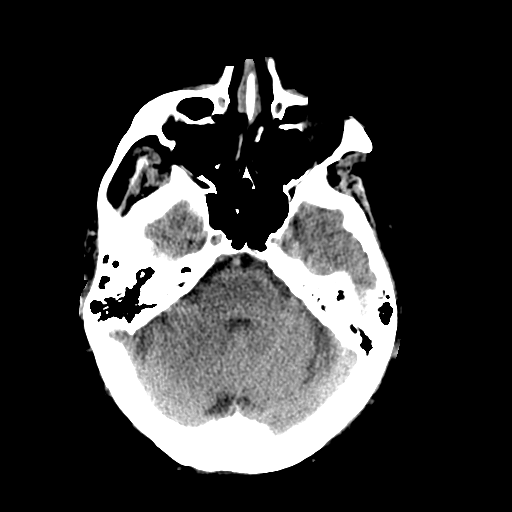
[im 12/33  brain]
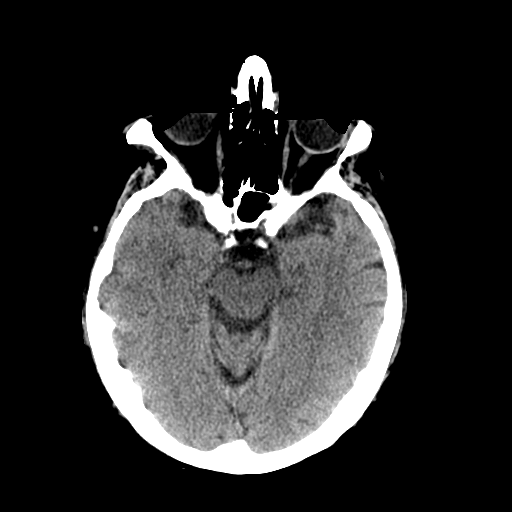
[im 15/33  brain]
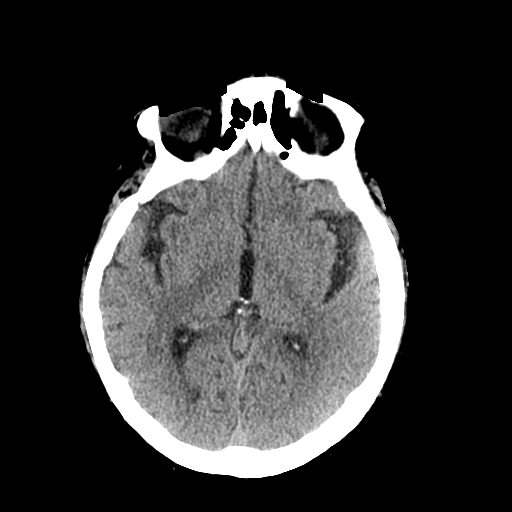
[im 15/33  bone]
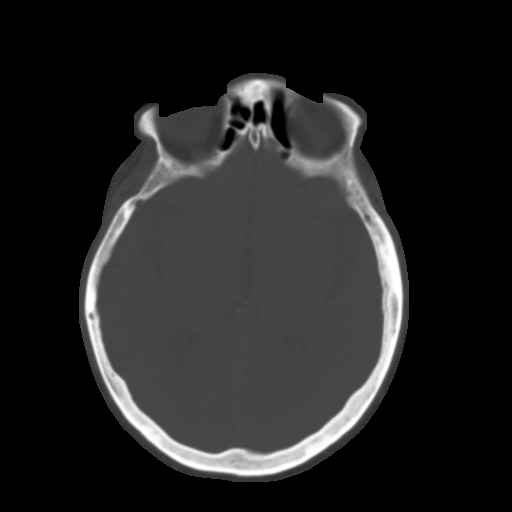
[im 18/33  brain]
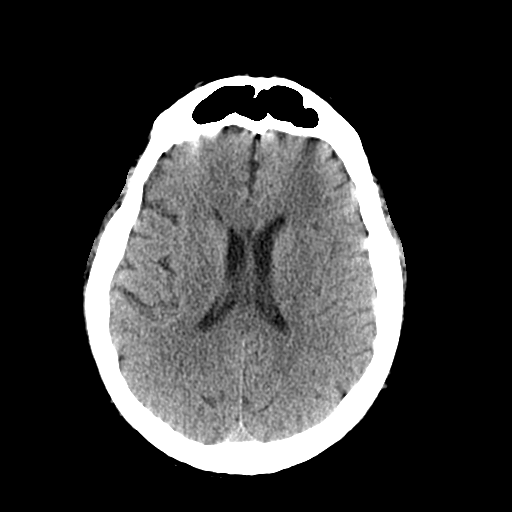
[im 21/33  brain]
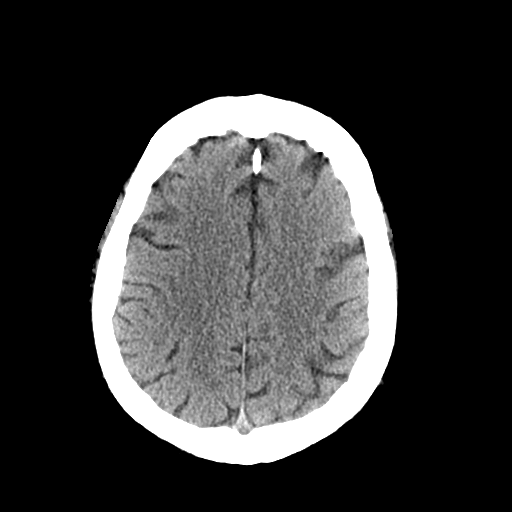
[im 25/33  brain]
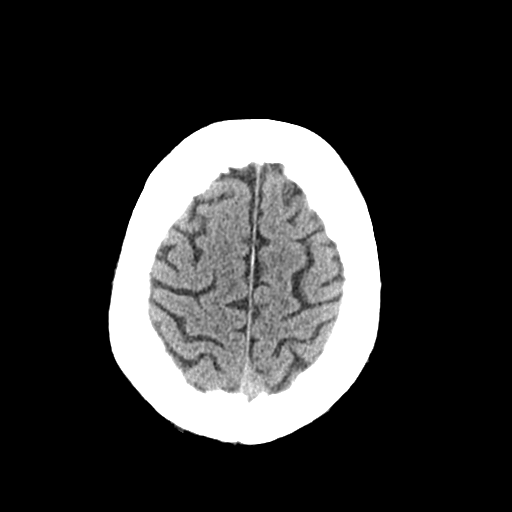
[im 27/33  brain]
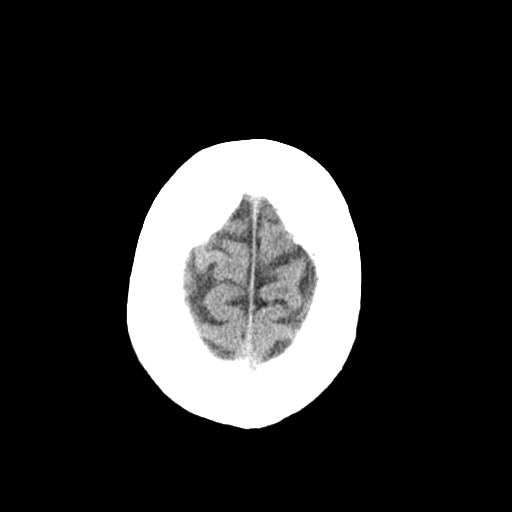
[im 27/33  bone]
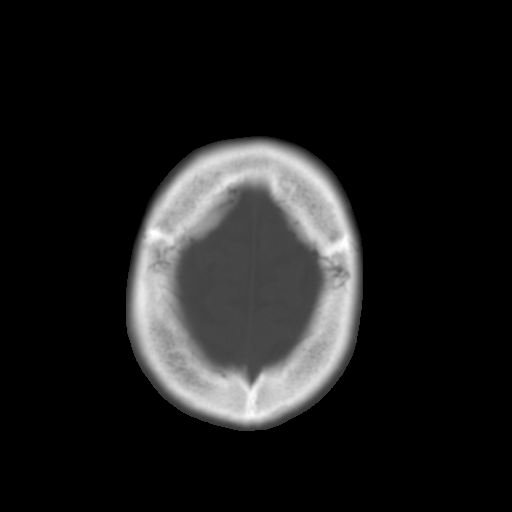
[im 30/33  brain]
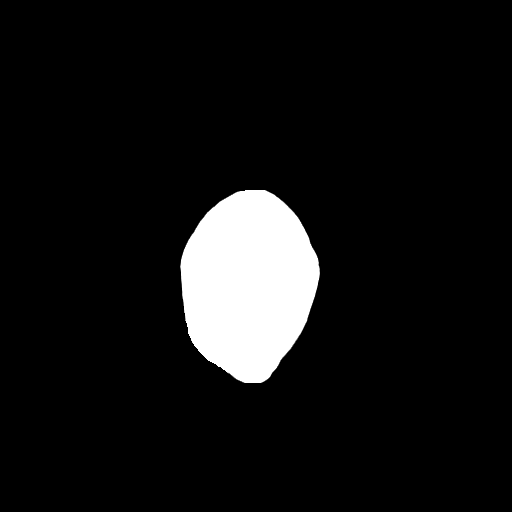

[Series 4: coronal soft tissue · coronal · 0.33mm/px · 3 of 72 slices shown]
[im 24/72  brain]
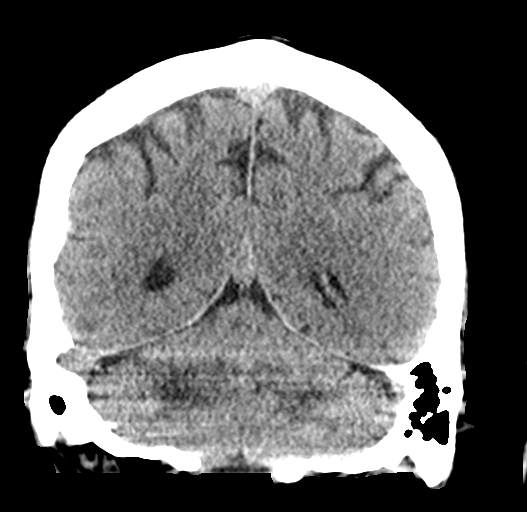
[im 32/72  brain]
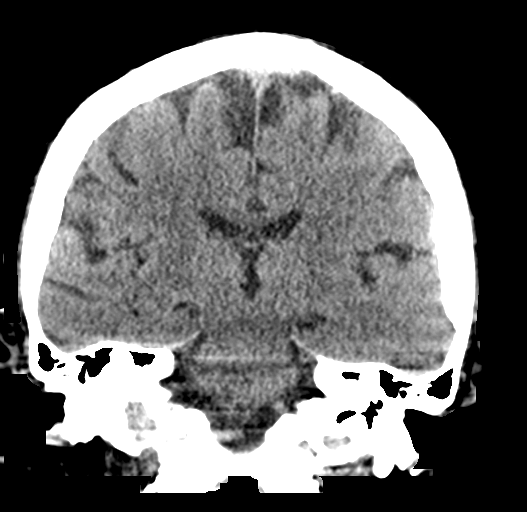
[im 40/72  brain]
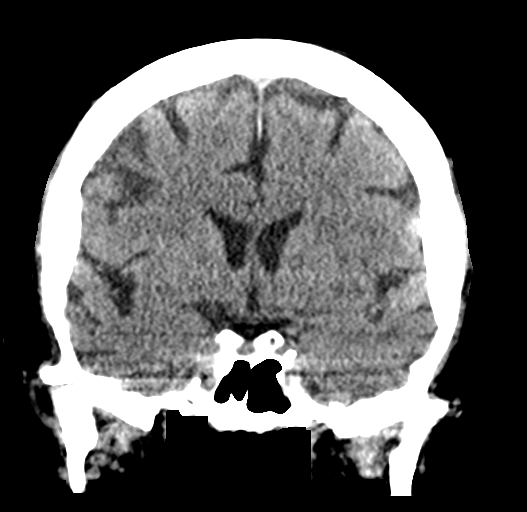

[Series 5: sagittal soft tissue · sagittal · 0.34mm/px · 3 of 59 slices shown]
[im 20/59  brain]
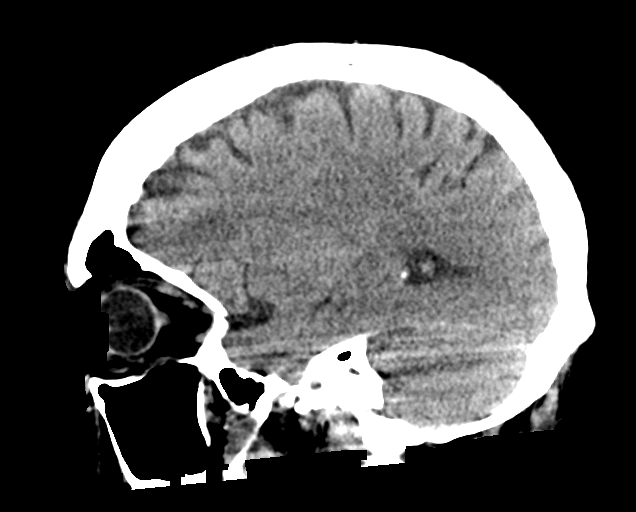
[im 30/59  brain]
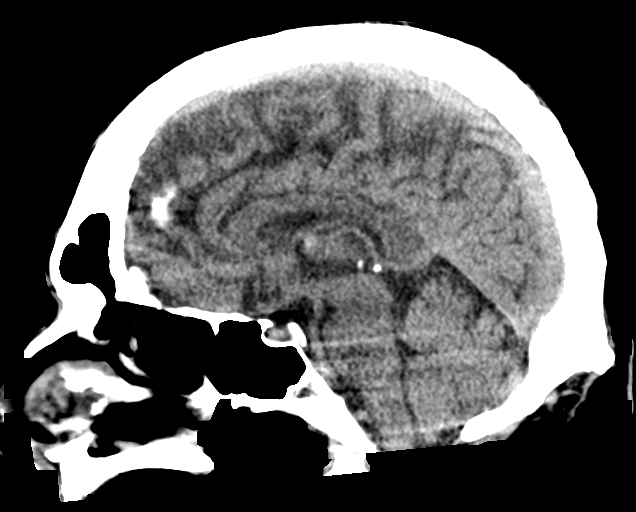
[im 39/59  brain]
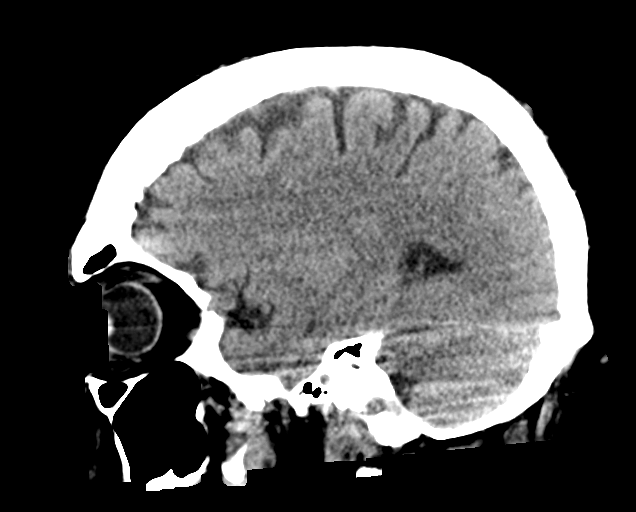

[16 of 47 positions shown; findings below may reference images not displayed]

FINDINGS: Brain: Gray-white differentiation is maintained. No CT evidence of
acute large territory infarct. Unchanged size of approximately
cm colloid cyst without associated ventricular dilatation, grossly
unchanged compared to the [DATE] examination. Otherwise, no
intraparenchymal or extra-axial mass. No intraparenchymal or
extra-axial hemorrhage. Normal size and configuration of the
ventricles and the basilar cisterns. No midline shift.

Vascular: Intracranial atherosclerosis.

Skull: No displaced calvarial fracture.

Sinuses/Orbits: Limited visualization the paranasal sinuses and
mastoid air cells is normal. No air-fluid levels.

Other: Regional soft tissues appear normal.
IMPRESSION: 1. Negative noncontrast head CT.
2. Punctate (approximately 0.5 cm) colloid cyst without associated
ventricular dilatation, grossly unchanged compared to the [DATE]
examination

## 2019-10-31 IMAGING — CT CT CHEST W/O CM
2 of 4 series · 15 of 36 positions shown, 18 images · non-contrast
Comparison: CXR 05/19/2018

CLINICAL DATA: Chest pain and dyspnea. Pleurisy.

EXAM:
CT CHEST WITHOUT CONTRAST
TECHNIQUE: Multidetector CT imaging of the chest was performed following the
standard protocol without IV contrast.

[Series 2: thorax · axial · 0.81mm/px · z∈[+1063,+1363]mm · 12 of 176 slices shown, 15 images]
[im 13/176  mediastinal]
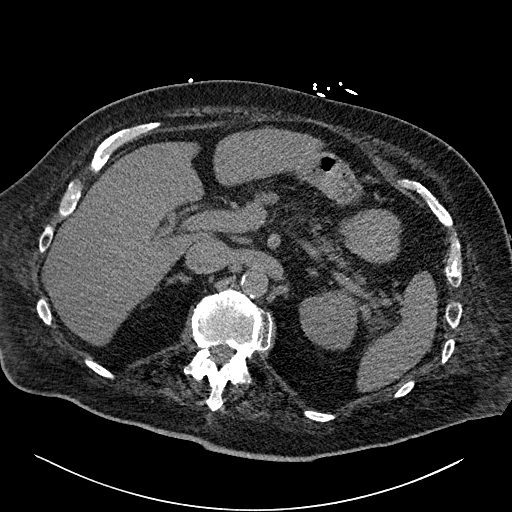
[im 13/176  lung]
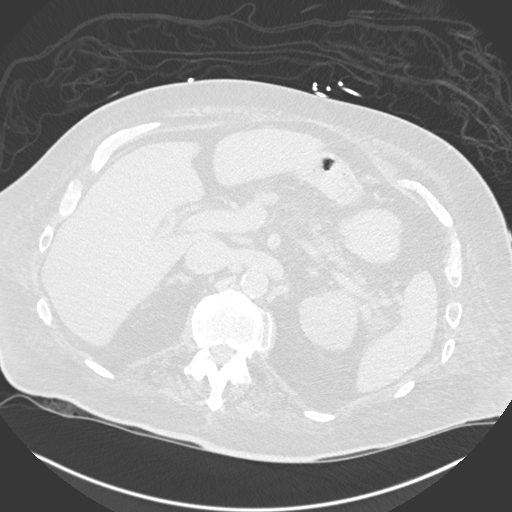
[im 26/176  lung]
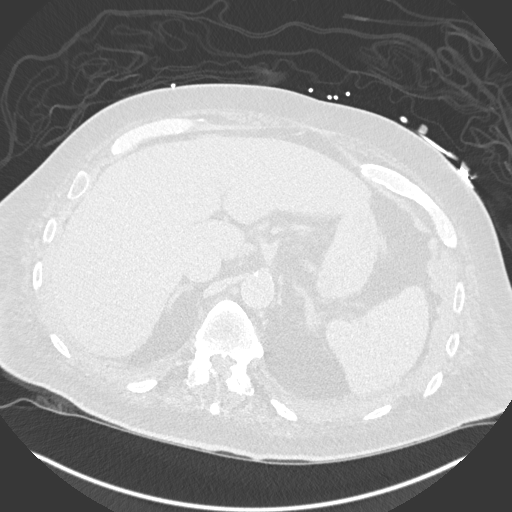
[im 38/176  lung]
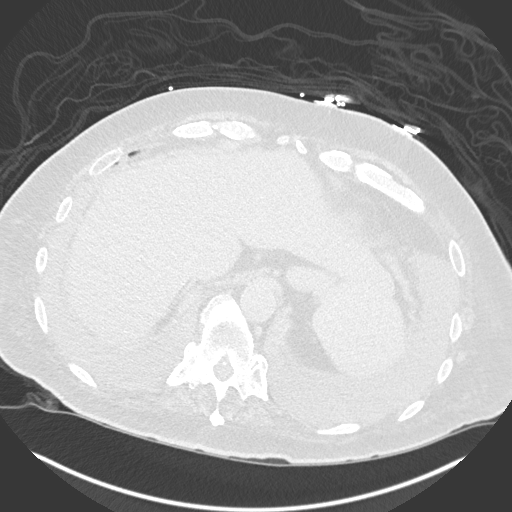
[im 51/176  lung]
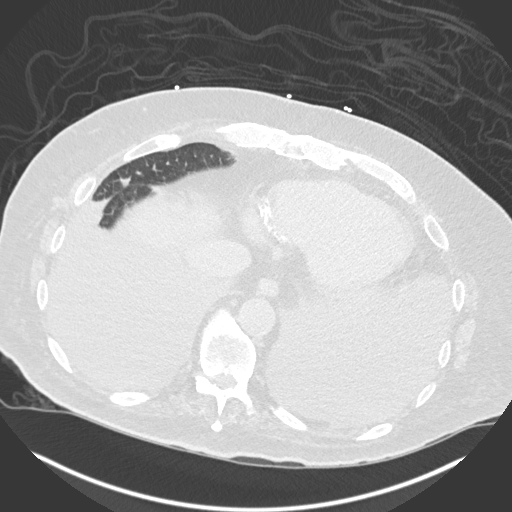
[im 63/176  mediastinal]
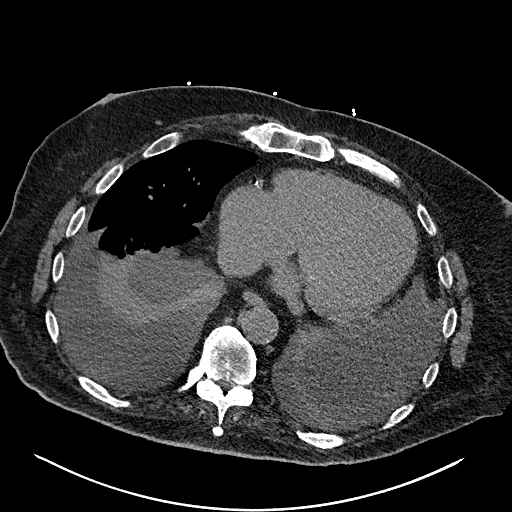
[im 63/176  lung]
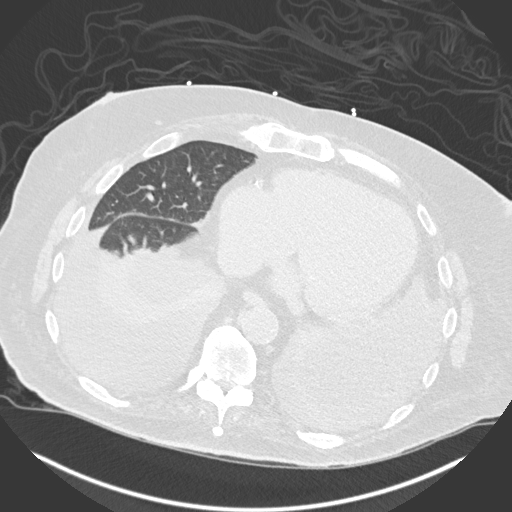
[im 76/176  lung]
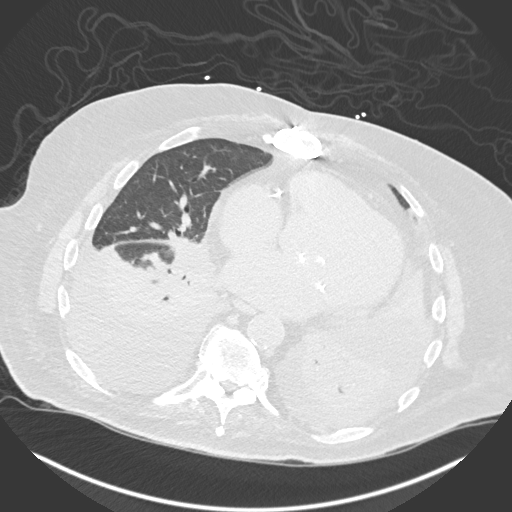
[im 101/176  lung]
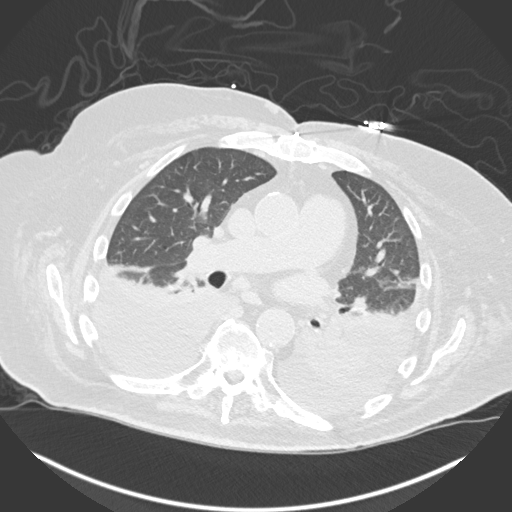
[im 113/176  lung]
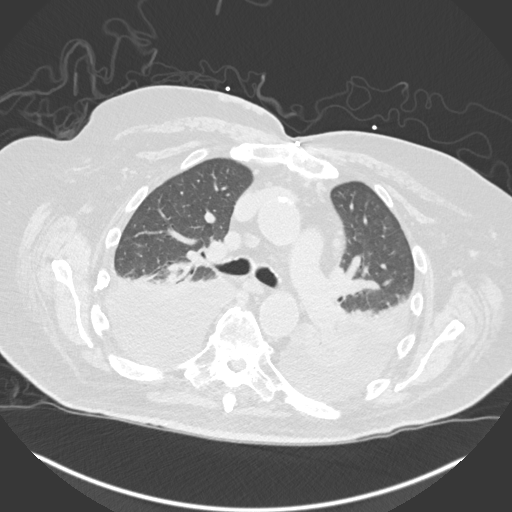
[im 126/176  mediastinal]
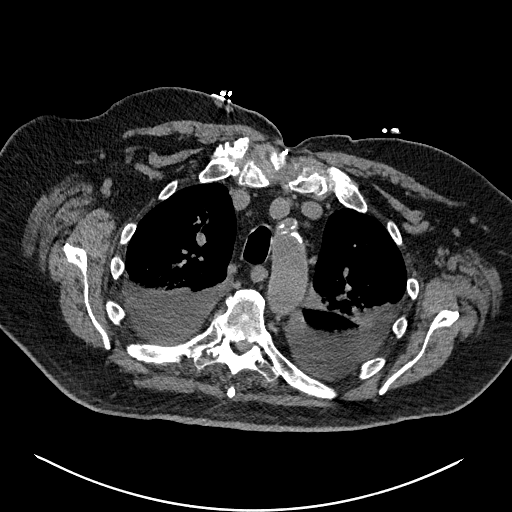
[im 126/176  lung]
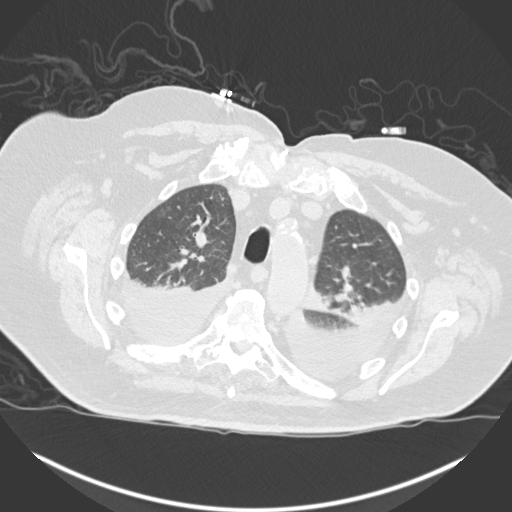
[im 138/176  lung]
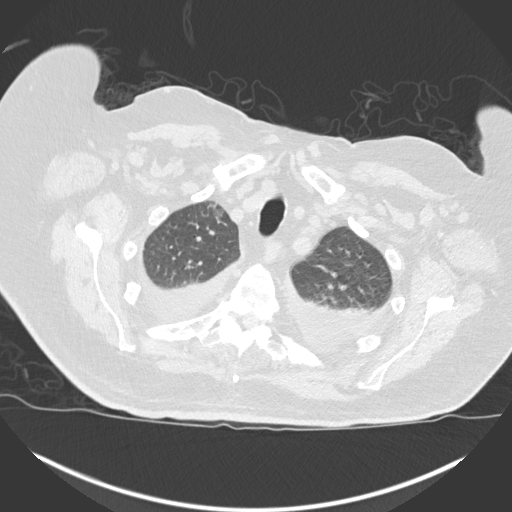
[im 151/176  lung]
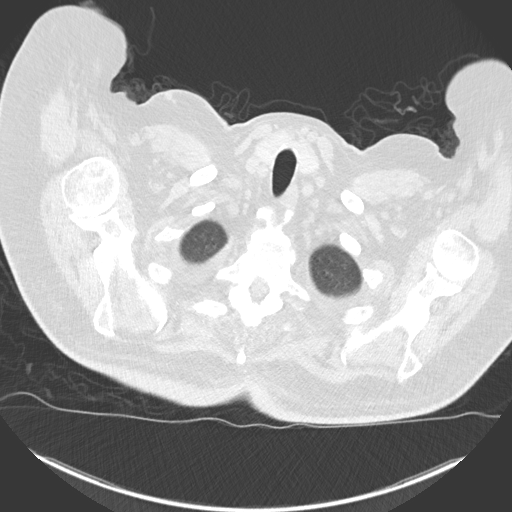
[im 163/176  lung]
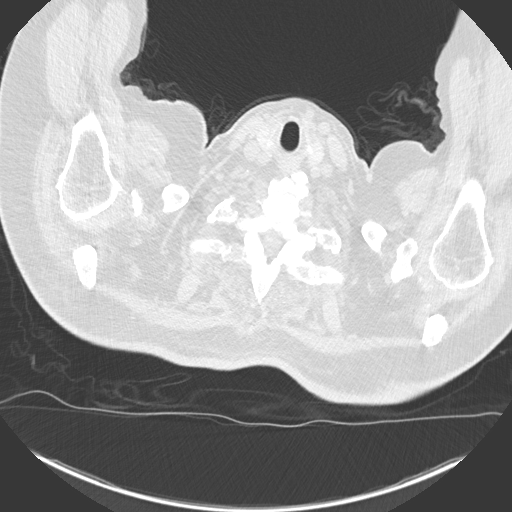

[Series 5: coronal · coronal · 0.71mm/px · 3 of 139 slices shown]
[im 28/139  lung]
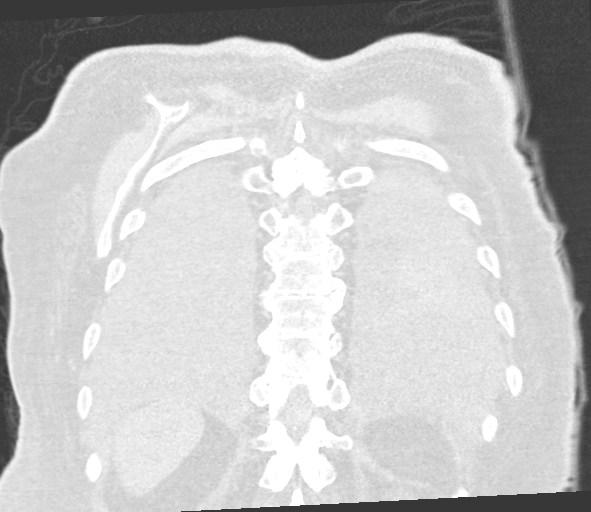
[im 56/139  lung]
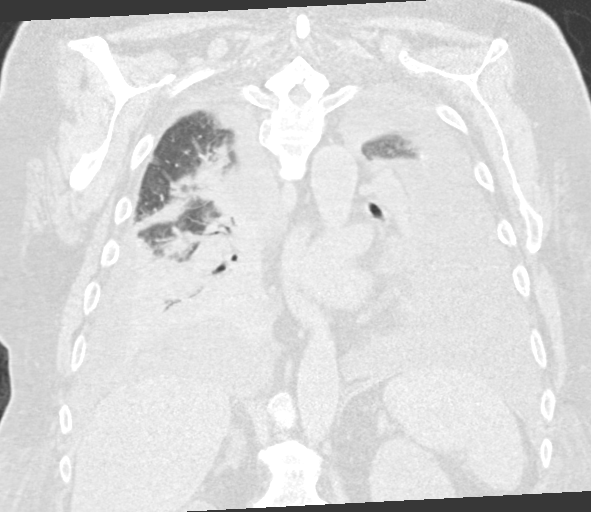
[im 83/139  lung]
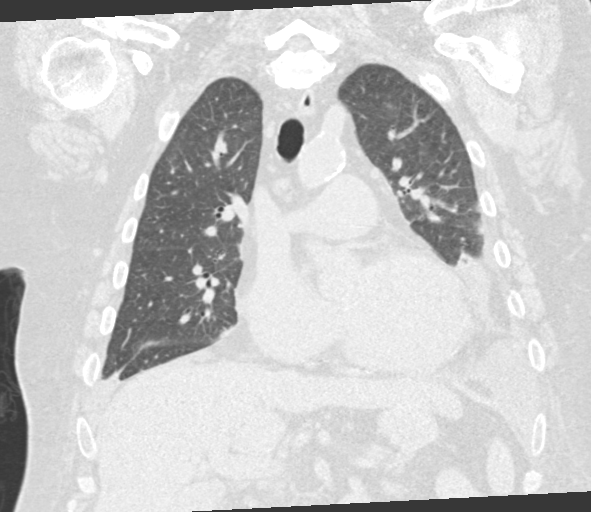

[15 of 36 positions shown; findings below may reference images not displayed]

FINDINGS: Cardiovascular: Atherosclerosis of the great vessels with
conventional branching pattern. Moderate atherosclerosis of the
thoracic aorta without aneurysm. Dilatation of the main pulmonary
artery to 3.8 cm consistent with chronic pulmonary hypertension.
Left main and three-vessel coronary arteriosclerosis is identified.
Status post mitral valvular repair. Median sternotomy sutures are
noted. No pericardial effusion or thickening. Heart size is
enlarged.

Mediastinum/Nodes: No enlarged mediastinal or axillary lymph nodes.
Thyroid gland, trachea, and esophagus demonstrate no significant
findings.

Lungs/Pleura: Moderate bilateral pleural effusions with compressive
atelectasis. Faint ground-glass opacities in the aerated lungs
bilaterally compatible with stigmata of CHF.

Upper Abdomen: No acute abnormality.

Musculoskeletal: No chest wall mass or suspicious bone lesions
identified. Spondylosis is noted of the included lower cervical,
thoracic and upper lumbar spine.
IMPRESSION: 1. Moderate bilateral pleural effusions with compressive
atelectasis.
2. Dilatation of the main pulmonary artery to 3.8 cm consistent with
chronic pulmonary hypertension.
3. Thoracolumbar spondylosis.
4. Coronary arteriosclerosis and aortic atherosclerosis.

Aortic Atherosclerosis ([id number]).

## 2019-10-31 IMAGING — DX DG CHEST 1V
1 series · 1 of 1 positions shown · non-contrast
Comparison: 05/16/2018

CLINICAL DATA: Altered level of consciousness

EXAM:
CHEST  1 VIEW

[chest ap]
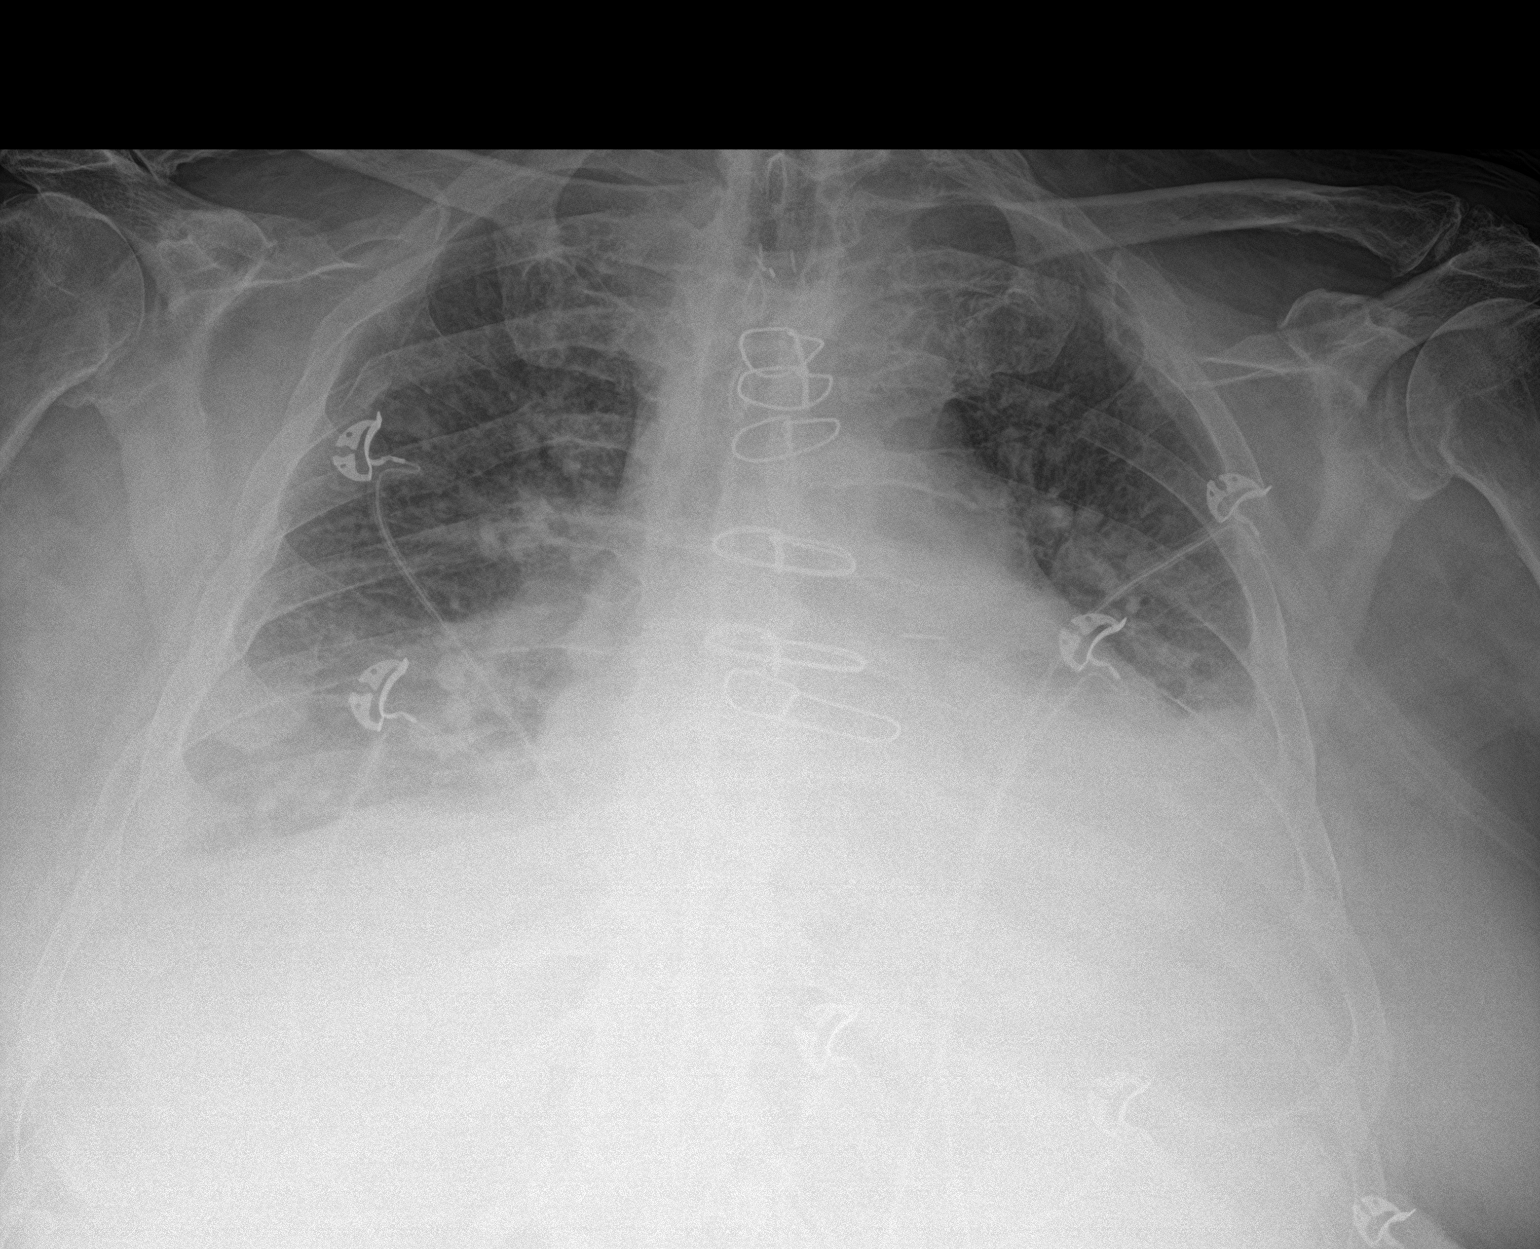

[1 of 1 positions shown; findings below may reference images not displayed]

FINDINGS: Cardiac shadow is enlarged. Postsurgical changes are again seen.
Bibasilar atelectasis and effusions are again identified and stable.
No new focal infiltrate is seen. No bony abnormality is noted.
IMPRESSION: Stable bibasilar atelectasis and effusions.

## 2019-11-01 DIAGNOSIS — M4327 Fusion of spine, lumbosacral region: Secondary | ICD-10-CM | POA: Diagnosis not present

## 2019-11-01 DIAGNOSIS — I482 Chronic atrial fibrillation, unspecified: Secondary | ICD-10-CM | POA: Diagnosis not present

## 2019-11-01 DIAGNOSIS — T8131XA Disruption of external operation (surgical) wound, not elsewhere classified, initial encounter: Secondary | ICD-10-CM | POA: Diagnosis not present

## 2019-11-01 DIAGNOSIS — R278 Other lack of coordination: Secondary | ICD-10-CM | POA: Diagnosis not present

## 2019-11-01 DIAGNOSIS — I5022 Chronic systolic (congestive) heart failure: Secondary | ICD-10-CM | POA: Diagnosis not present

## 2019-11-01 DIAGNOSIS — E1169 Type 2 diabetes mellitus with other specified complication: Secondary | ICD-10-CM | POA: Diagnosis not present

## 2019-11-01 DIAGNOSIS — M6281 Muscle weakness (generalized): Secondary | ICD-10-CM | POA: Diagnosis not present

## 2019-11-01 DIAGNOSIS — I4891 Unspecified atrial fibrillation: Secondary | ICD-10-CM | POA: Diagnosis not present

## 2019-11-01 DIAGNOSIS — S32012A Unstable burst fracture of first lumbar vertebra, initial encounter for closed fracture: Secondary | ICD-10-CM | POA: Diagnosis not present

## 2019-11-01 DIAGNOSIS — E669 Obesity, unspecified: Secondary | ICD-10-CM | POA: Diagnosis not present

## 2019-11-01 DIAGNOSIS — R2689 Other abnormalities of gait and mobility: Secondary | ICD-10-CM | POA: Diagnosis not present

## 2019-11-02 ENCOUNTER — Telehealth: Payer: Self-pay | Admitting: Internal Medicine

## 2019-11-02 DIAGNOSIS — R278 Other lack of coordination: Secondary | ICD-10-CM | POA: Diagnosis not present

## 2019-11-02 DIAGNOSIS — Z794 Long term (current) use of insulin: Secondary | ICD-10-CM | POA: Diagnosis not present

## 2019-11-02 DIAGNOSIS — R2689 Other abnormalities of gait and mobility: Secondary | ICD-10-CM | POA: Diagnosis not present

## 2019-11-02 DIAGNOSIS — I5022 Chronic systolic (congestive) heart failure: Secondary | ICD-10-CM | POA: Diagnosis not present

## 2019-11-02 DIAGNOSIS — J449 Chronic obstructive pulmonary disease, unspecified: Secondary | ICD-10-CM | POA: Diagnosis not present

## 2019-11-02 DIAGNOSIS — E1169 Type 2 diabetes mellitus with other specified complication: Secondary | ICD-10-CM | POA: Diagnosis not present

## 2019-11-02 DIAGNOSIS — E119 Type 2 diabetes mellitus without complications: Secondary | ICD-10-CM | POA: Diagnosis not present

## 2019-11-02 DIAGNOSIS — H2513 Age-related nuclear cataract, bilateral: Secondary | ICD-10-CM | POA: Diagnosis not present

## 2019-11-02 DIAGNOSIS — M6281 Muscle weakness (generalized): Secondary | ICD-10-CM | POA: Diagnosis not present

## 2019-11-02 DIAGNOSIS — I482 Chronic atrial fibrillation, unspecified: Secondary | ICD-10-CM | POA: Diagnosis not present

## 2019-11-02 DIAGNOSIS — I4891 Unspecified atrial fibrillation: Secondary | ICD-10-CM | POA: Diagnosis not present

## 2019-11-02 DIAGNOSIS — Z7984 Long term (current) use of oral hypoglycemic drugs: Secondary | ICD-10-CM | POA: Diagnosis not present

## 2019-11-02 DIAGNOSIS — I509 Heart failure, unspecified: Secondary | ICD-10-CM | POA: Diagnosis not present

## 2019-11-02 DIAGNOSIS — M4327 Fusion of spine, lumbosacral region: Secondary | ICD-10-CM | POA: Diagnosis not present

## 2019-11-02 IMAGING — CR DG CHEST 1V PORT
1 series · 1 of 1 positions shown · non-contrast
Comparison: May 19, 2018

CLINICAL DATA: Hyperglycemia.  Acute mental status change.

EXAM:
PORTABLE CHEST 1 VIEW

[ap]
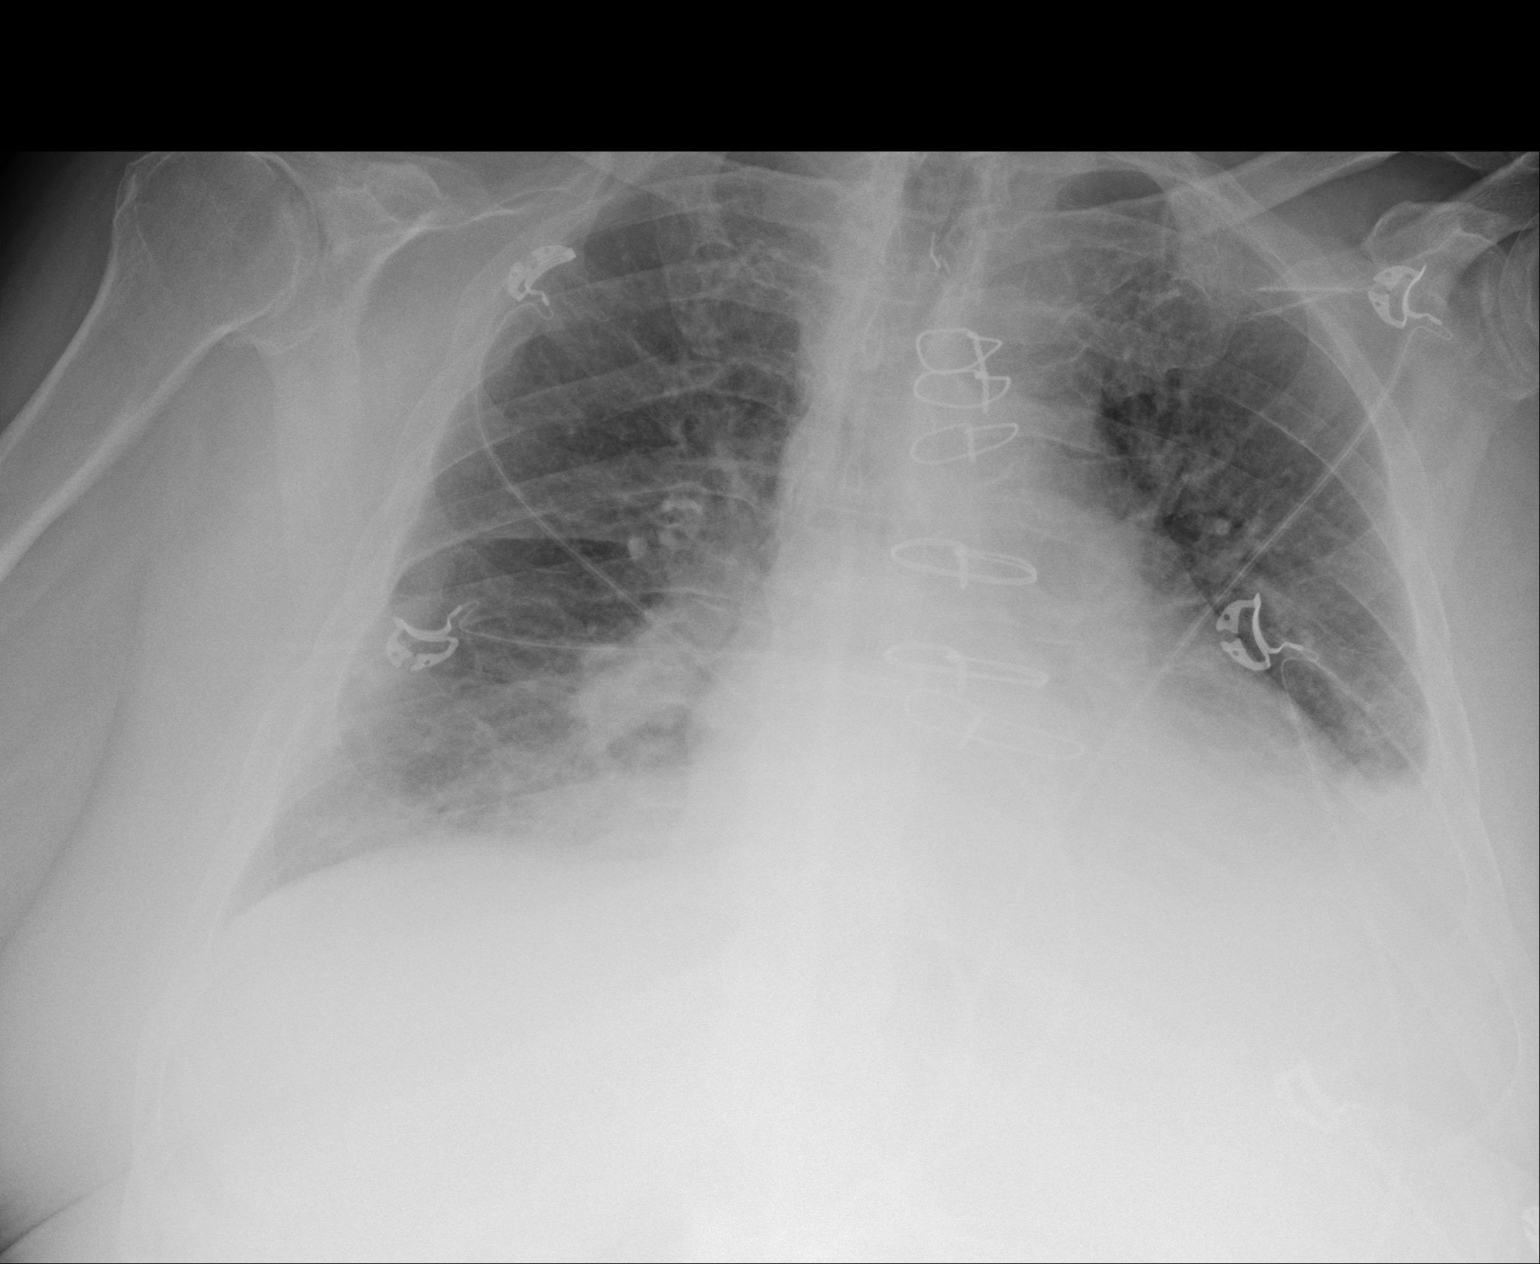

[1 of 1 positions shown; findings below may reference images not displayed]

FINDINGS: Stable cardiomegaly. Hila and mediastinum are unchanged. No
pneumothorax. Bilateral pleural effusions with underlying
atelectasis. Mild edema. No other acute abnormalities.
IMPRESSION: Cardiomegaly, small effusions, and pulmonary edema.

## 2019-11-02 NOTE — Telephone Encounter (Signed)
Spoke with [name]. She is going to speak further with her team, since she found that the tube has been placed at [facility].

## 2019-11-02 NOTE — Telephone Encounter (Signed)
[name] from [place] called in regards to patient needing a Peg Tube changed. She said [facility] did the peg tube in 06/04/2019 per her notes. Please advise. [phone]

## 2019-11-03 ENCOUNTER — Other Ambulatory Visit (HOSPITAL_COMMUNITY): Payer: Self-pay | Admitting: Family Medicine

## 2019-11-03 DIAGNOSIS — R633 Feeding difficulties, unspecified: Secondary | ICD-10-CM

## 2019-11-03 DIAGNOSIS — M6281 Muscle weakness (generalized): Secondary | ICD-10-CM | POA: Diagnosis not present

## 2019-11-03 DIAGNOSIS — M4327 Fusion of spine, lumbosacral region: Secondary | ICD-10-CM | POA: Diagnosis not present

## 2019-11-03 DIAGNOSIS — I482 Chronic atrial fibrillation, unspecified: Secondary | ICD-10-CM | POA: Diagnosis not present

## 2019-11-03 DIAGNOSIS — R2689 Other abnormalities of gait and mobility: Secondary | ICD-10-CM | POA: Diagnosis not present

## 2019-11-03 DIAGNOSIS — R278 Other lack of coordination: Secondary | ICD-10-CM | POA: Diagnosis not present

## 2019-11-03 DIAGNOSIS — I5022 Chronic systolic (congestive) heart failure: Secondary | ICD-10-CM | POA: Diagnosis not present

## 2019-11-04 ENCOUNTER — Other Ambulatory Visit: Payer: Self-pay | Admitting: Physician Assistant

## 2019-11-04 DIAGNOSIS — R2689 Other abnormalities of gait and mobility: Secondary | ICD-10-CM | POA: Diagnosis not present

## 2019-11-04 DIAGNOSIS — R278 Other lack of coordination: Secondary | ICD-10-CM | POA: Diagnosis not present

## 2019-11-04 DIAGNOSIS — I5022 Chronic systolic (congestive) heart failure: Secondary | ICD-10-CM | POA: Diagnosis not present

## 2019-11-04 DIAGNOSIS — S32012A Unstable burst fracture of first lumbar vertebra, initial encounter for closed fracture: Secondary | ICD-10-CM

## 2019-11-04 DIAGNOSIS — Z20828 Contact with and (suspected) exposure to other viral communicable diseases: Secondary | ICD-10-CM | POA: Diagnosis not present

## 2019-11-04 DIAGNOSIS — M4327 Fusion of spine, lumbosacral region: Secondary | ICD-10-CM | POA: Diagnosis not present

## 2019-11-04 DIAGNOSIS — M6281 Muscle weakness (generalized): Secondary | ICD-10-CM | POA: Diagnosis not present

## 2019-11-04 DIAGNOSIS — I482 Chronic atrial fibrillation, unspecified: Secondary | ICD-10-CM | POA: Diagnosis not present

## 2019-11-05 DIAGNOSIS — I5022 Chronic systolic (congestive) heart failure: Secondary | ICD-10-CM | POA: Diagnosis not present

## 2019-11-05 DIAGNOSIS — R2689 Other abnormalities of gait and mobility: Secondary | ICD-10-CM | POA: Diagnosis not present

## 2019-11-05 DIAGNOSIS — M6281 Muscle weakness (generalized): Secondary | ICD-10-CM | POA: Diagnosis not present

## 2019-11-05 DIAGNOSIS — R278 Other lack of coordination: Secondary | ICD-10-CM | POA: Diagnosis not present

## 2019-11-05 DIAGNOSIS — M4327 Fusion of spine, lumbosacral region: Secondary | ICD-10-CM | POA: Diagnosis not present

## 2019-11-05 DIAGNOSIS — F4321 Adjustment disorder with depressed mood: Secondary | ICD-10-CM | POA: Diagnosis not present

## 2019-11-05 DIAGNOSIS — I482 Chronic atrial fibrillation, unspecified: Secondary | ICD-10-CM | POA: Diagnosis not present

## 2019-11-08 ENCOUNTER — Other Ambulatory Visit: Payer: Self-pay

## 2019-11-08 ENCOUNTER — Ambulatory Visit (HOSPITAL_COMMUNITY)
Admission: RE | Admit: 2019-11-08 | Discharge: 2019-11-08 | Disposition: A | Discharge status: Home / Self Care | Payer: Medicare Other | Source: Ambulatory Visit | Attending: Family Medicine | Admitting: Family Medicine

## 2019-11-08 DIAGNOSIS — I482 Chronic atrial fibrillation, unspecified: Secondary | ICD-10-CM | POA: Diagnosis not present

## 2019-11-08 DIAGNOSIS — M4327 Fusion of spine, lumbosacral region: Secondary | ICD-10-CM | POA: Diagnosis not present

## 2019-11-08 DIAGNOSIS — Z431 Encounter for attention to gastrostomy: Secondary | ICD-10-CM | POA: Insufficient documentation

## 2019-11-08 DIAGNOSIS — R2689 Other abnormalities of gait and mobility: Secondary | ICD-10-CM | POA: Diagnosis not present

## 2019-11-08 DIAGNOSIS — I5022 Chronic systolic (congestive) heart failure: Secondary | ICD-10-CM | POA: Diagnosis not present

## 2019-11-08 DIAGNOSIS — R278 Other lack of coordination: Secondary | ICD-10-CM | POA: Diagnosis not present

## 2019-11-08 DIAGNOSIS — R633 Feeding difficulties, unspecified: Secondary | ICD-10-CM

## 2019-11-08 DIAGNOSIS — Z741 Need for assistance with personal care: Secondary | ICD-10-CM | POA: Diagnosis not present

## 2019-11-08 DIAGNOSIS — M6281 Muscle weakness (generalized): Secondary | ICD-10-CM | POA: Diagnosis not present

## 2019-11-08 DIAGNOSIS — R293 Abnormal posture: Secondary | ICD-10-CM | POA: Diagnosis not present

## 2019-11-08 HISTORY — PX: IR GASTROSTOMY TUBE REMOVAL: IMG5492

## 2019-11-08 MED ORDER — LIDOCAINE VISCOUS HCL 2 % MT SOLN
OROMUCOSAL | Status: AC
  Filled 2019-11-08: qty 15

## 2019-11-08 NOTE — Procedures (Signed)
Patient's 20 French balloon retention gastrostomy tube was removed in its entirety without immediate complications.  Gauze dressing applied over site. EBL< 2cc.

## 2019-11-09 DIAGNOSIS — R1110 Vomiting, unspecified: Secondary | ICD-10-CM | POA: Diagnosis not present

## 2019-11-09 DIAGNOSIS — D649 Anemia, unspecified: Secondary | ICD-10-CM | POA: Diagnosis not present

## 2019-11-10 DIAGNOSIS — Z20828 Contact with and (suspected) exposure to other viral communicable diseases: Secondary | ICD-10-CM | POA: Diagnosis not present

## 2019-11-11 DIAGNOSIS — M4327 Fusion of spine, lumbosacral region: Secondary | ICD-10-CM | POA: Diagnosis not present

## 2019-11-11 DIAGNOSIS — R1310 Dysphagia, unspecified: Secondary | ICD-10-CM | POA: Diagnosis not present

## 2019-11-11 DIAGNOSIS — I482 Chronic atrial fibrillation, unspecified: Secondary | ICD-10-CM | POA: Diagnosis not present

## 2019-11-11 DIAGNOSIS — I5022 Chronic systolic (congestive) heart failure: Secondary | ICD-10-CM | POA: Diagnosis not present

## 2019-11-11 DIAGNOSIS — I4891 Unspecified atrial fibrillation: Secondary | ICD-10-CM | POA: Diagnosis not present

## 2019-11-11 DIAGNOSIS — R278 Other lack of coordination: Secondary | ICD-10-CM | POA: Diagnosis not present

## 2019-11-11 DIAGNOSIS — M6281 Muscle weakness (generalized): Secondary | ICD-10-CM | POA: Diagnosis not present

## 2019-11-11 DIAGNOSIS — E1143 Type 2 diabetes mellitus with diabetic autonomic (poly)neuropathy: Secondary | ICD-10-CM | POA: Diagnosis not present

## 2019-11-11 DIAGNOSIS — R2689 Other abnormalities of gait and mobility: Secondary | ICD-10-CM | POA: Diagnosis not present

## 2019-11-11 DIAGNOSIS — E1169 Type 2 diabetes mellitus with other specified complication: Secondary | ICD-10-CM | POA: Diagnosis not present

## 2019-11-12 DIAGNOSIS — I5022 Chronic systolic (congestive) heart failure: Secondary | ICD-10-CM | POA: Diagnosis not present

## 2019-11-12 DIAGNOSIS — R2689 Other abnormalities of gait and mobility: Secondary | ICD-10-CM | POA: Diagnosis not present

## 2019-11-12 DIAGNOSIS — M4327 Fusion of spine, lumbosacral region: Secondary | ICD-10-CM | POA: Diagnosis not present

## 2019-11-12 DIAGNOSIS — I482 Chronic atrial fibrillation, unspecified: Secondary | ICD-10-CM | POA: Diagnosis not present

## 2019-11-12 DIAGNOSIS — R278 Other lack of coordination: Secondary | ICD-10-CM | POA: Diagnosis not present

## 2019-11-12 DIAGNOSIS — M6281 Muscle weakness (generalized): Secondary | ICD-10-CM | POA: Diagnosis not present

## 2019-11-15 DIAGNOSIS — M6281 Muscle weakness (generalized): Secondary | ICD-10-CM | POA: Diagnosis not present

## 2019-11-15 DIAGNOSIS — I5022 Chronic systolic (congestive) heart failure: Secondary | ICD-10-CM | POA: Diagnosis not present

## 2019-11-15 DIAGNOSIS — I482 Chronic atrial fibrillation, unspecified: Secondary | ICD-10-CM | POA: Diagnosis not present

## 2019-11-15 DIAGNOSIS — R2689 Other abnormalities of gait and mobility: Secondary | ICD-10-CM | POA: Diagnosis not present

## 2019-11-15 DIAGNOSIS — M4327 Fusion of spine, lumbosacral region: Secondary | ICD-10-CM | POA: Diagnosis not present

## 2019-11-15 DIAGNOSIS — R278 Other lack of coordination: Secondary | ICD-10-CM | POA: Diagnosis not present

## 2019-11-16 DIAGNOSIS — M4327 Fusion of spine, lumbosacral region: Secondary | ICD-10-CM | POA: Diagnosis not present

## 2019-11-16 DIAGNOSIS — Z20828 Contact with and (suspected) exposure to other viral communicable diseases: Secondary | ICD-10-CM | POA: Diagnosis not present

## 2019-11-16 DIAGNOSIS — I482 Chronic atrial fibrillation, unspecified: Secondary | ICD-10-CM | POA: Diagnosis not present

## 2019-11-16 DIAGNOSIS — R278 Other lack of coordination: Secondary | ICD-10-CM | POA: Diagnosis not present

## 2019-11-16 DIAGNOSIS — M6281 Muscle weakness (generalized): Secondary | ICD-10-CM | POA: Diagnosis not present

## 2019-11-16 DIAGNOSIS — I5022 Chronic systolic (congestive) heart failure: Secondary | ICD-10-CM | POA: Diagnosis not present

## 2019-11-16 DIAGNOSIS — R2689 Other abnormalities of gait and mobility: Secondary | ICD-10-CM | POA: Diagnosis not present

## 2019-11-17 ENCOUNTER — Telehealth: Payer: Self-pay

## 2019-11-17 DIAGNOSIS — M4327 Fusion of spine, lumbosacral region: Secondary | ICD-10-CM | POA: Diagnosis not present

## 2019-11-17 DIAGNOSIS — I509 Heart failure, unspecified: Secondary | ICD-10-CM | POA: Diagnosis not present

## 2019-11-17 DIAGNOSIS — R278 Other lack of coordination: Secondary | ICD-10-CM | POA: Diagnosis not present

## 2019-11-17 DIAGNOSIS — R2689 Other abnormalities of gait and mobility: Secondary | ICD-10-CM | POA: Diagnosis not present

## 2019-11-17 DIAGNOSIS — E1143 Type 2 diabetes mellitus with diabetic autonomic (poly)neuropathy: Secondary | ICD-10-CM | POA: Diagnosis not present

## 2019-11-17 DIAGNOSIS — M6281 Muscle weakness (generalized): Secondary | ICD-10-CM | POA: Diagnosis not present

## 2019-11-17 DIAGNOSIS — I5022 Chronic systolic (congestive) heart failure: Secondary | ICD-10-CM | POA: Diagnosis not present

## 2019-11-17 DIAGNOSIS — I482 Chronic atrial fibrillation, unspecified: Secondary | ICD-10-CM | POA: Diagnosis not present

## 2019-11-17 DIAGNOSIS — E1169 Type 2 diabetes mellitus with other specified complication: Secondary | ICD-10-CM | POA: Diagnosis not present

## 2019-11-17 DIAGNOSIS — R1310 Dysphagia, unspecified: Secondary | ICD-10-CM | POA: Diagnosis not present

## 2019-11-17 NOTE — Telephone Encounter (Signed)
lmom letting him know we have been trying since November 04, 2019 to schedule his dad's thoracic and lumbar myelogram with [facility], but they have not returned any of our numerous phone calls (messages left on nurse voice mail) nor have they responded to the letter I faxed them (in epic under "letters").  Later today I will call the referring provider, Cindra Presume, PA-C, to let them know why this procedure has not been scheduled.

## 2019-11-18 DIAGNOSIS — R2689 Other abnormalities of gait and mobility: Secondary | ICD-10-CM | POA: Diagnosis not present

## 2019-11-18 DIAGNOSIS — M6281 Muscle weakness (generalized): Secondary | ICD-10-CM | POA: Diagnosis not present

## 2019-11-18 DIAGNOSIS — M4327 Fusion of spine, lumbosacral region: Secondary | ICD-10-CM | POA: Diagnosis not present

## 2019-11-18 DIAGNOSIS — Z20828 Contact with and (suspected) exposure to other viral communicable diseases: Secondary | ICD-10-CM | POA: Diagnosis not present

## 2019-11-18 DIAGNOSIS — R278 Other lack of coordination: Secondary | ICD-10-CM | POA: Diagnosis not present

## 2019-11-18 DIAGNOSIS — I5022 Chronic systolic (congestive) heart failure: Secondary | ICD-10-CM | POA: Diagnosis not present

## 2019-11-18 DIAGNOSIS — I482 Chronic atrial fibrillation, unspecified: Secondary | ICD-10-CM | POA: Diagnosis not present

## 2019-11-19 DIAGNOSIS — I482 Chronic atrial fibrillation, unspecified: Secondary | ICD-10-CM | POA: Diagnosis not present

## 2019-11-19 DIAGNOSIS — I5022 Chronic systolic (congestive) heart failure: Secondary | ICD-10-CM | POA: Diagnosis not present

## 2019-11-19 DIAGNOSIS — M6281 Muscle weakness (generalized): Secondary | ICD-10-CM | POA: Diagnosis not present

## 2019-11-19 DIAGNOSIS — M4327 Fusion of spine, lumbosacral region: Secondary | ICD-10-CM | POA: Diagnosis not present

## 2019-11-19 DIAGNOSIS — R2689 Other abnormalities of gait and mobility: Secondary | ICD-10-CM | POA: Diagnosis not present

## 2019-11-19 DIAGNOSIS — R278 Other lack of coordination: Secondary | ICD-10-CM | POA: Diagnosis not present

## 2019-11-22 DIAGNOSIS — R278 Other lack of coordination: Secondary | ICD-10-CM | POA: Diagnosis not present

## 2019-11-22 DIAGNOSIS — I482 Chronic atrial fibrillation, unspecified: Secondary | ICD-10-CM | POA: Diagnosis not present

## 2019-11-22 DIAGNOSIS — I5022 Chronic systolic (congestive) heart failure: Secondary | ICD-10-CM | POA: Diagnosis not present

## 2019-11-22 DIAGNOSIS — M4327 Fusion of spine, lumbosacral region: Secondary | ICD-10-CM | POA: Diagnosis not present

## 2019-11-22 DIAGNOSIS — R2689 Other abnormalities of gait and mobility: Secondary | ICD-10-CM | POA: Diagnosis not present

## 2019-11-22 DIAGNOSIS — M6281 Muscle weakness (generalized): Secondary | ICD-10-CM | POA: Diagnosis not present

## 2019-11-23 DIAGNOSIS — I5022 Chronic systolic (congestive) heart failure: Secondary | ICD-10-CM | POA: Diagnosis not present

## 2019-11-23 DIAGNOSIS — I482 Chronic atrial fibrillation, unspecified: Secondary | ICD-10-CM | POA: Diagnosis not present

## 2019-11-23 DIAGNOSIS — R278 Other lack of coordination: Secondary | ICD-10-CM | POA: Diagnosis not present

## 2019-11-23 DIAGNOSIS — F4321 Adjustment disorder with depressed mood: Secondary | ICD-10-CM | POA: Diagnosis not present

## 2019-11-23 DIAGNOSIS — Z20828 Contact with and (suspected) exposure to other viral communicable diseases: Secondary | ICD-10-CM | POA: Diagnosis not present

## 2019-11-23 DIAGNOSIS — R2689 Other abnormalities of gait and mobility: Secondary | ICD-10-CM | POA: Diagnosis not present

## 2019-11-23 DIAGNOSIS — M6281 Muscle weakness (generalized): Secondary | ICD-10-CM | POA: Diagnosis not present

## 2019-11-23 DIAGNOSIS — M4327 Fusion of spine, lumbosacral region: Secondary | ICD-10-CM | POA: Diagnosis not present

## 2019-11-24 DIAGNOSIS — M6281 Muscle weakness (generalized): Secondary | ICD-10-CM | POA: Diagnosis not present

## 2019-11-24 DIAGNOSIS — I5022 Chronic systolic (congestive) heart failure: Secondary | ICD-10-CM | POA: Diagnosis not present

## 2019-11-24 DIAGNOSIS — K9422 Gastrostomy infection: Secondary | ICD-10-CM | POA: Diagnosis not present

## 2019-11-24 DIAGNOSIS — R278 Other lack of coordination: Secondary | ICD-10-CM | POA: Diagnosis not present

## 2019-11-24 DIAGNOSIS — R2689 Other abnormalities of gait and mobility: Secondary | ICD-10-CM | POA: Diagnosis not present

## 2019-11-24 DIAGNOSIS — I482 Chronic atrial fibrillation, unspecified: Secondary | ICD-10-CM | POA: Diagnosis not present

## 2019-11-24 DIAGNOSIS — M4327 Fusion of spine, lumbosacral region: Secondary | ICD-10-CM | POA: Diagnosis not present

## 2019-11-25 DIAGNOSIS — I5022 Chronic systolic (congestive) heart failure: Secondary | ICD-10-CM | POA: Diagnosis not present

## 2019-11-25 DIAGNOSIS — Z20828 Contact with and (suspected) exposure to other viral communicable diseases: Secondary | ICD-10-CM | POA: Diagnosis not present

## 2019-11-25 DIAGNOSIS — M4327 Fusion of spine, lumbosacral region: Secondary | ICD-10-CM | POA: Diagnosis not present

## 2019-11-25 DIAGNOSIS — I482 Chronic atrial fibrillation, unspecified: Secondary | ICD-10-CM | POA: Diagnosis not present

## 2019-11-25 DIAGNOSIS — R278 Other lack of coordination: Secondary | ICD-10-CM | POA: Diagnosis not present

## 2019-11-25 DIAGNOSIS — R2689 Other abnormalities of gait and mobility: Secondary | ICD-10-CM | POA: Diagnosis not present

## 2019-11-25 DIAGNOSIS — M6281 Muscle weakness (generalized): Secondary | ICD-10-CM | POA: Diagnosis not present

## 2019-11-26 DIAGNOSIS — R278 Other lack of coordination: Secondary | ICD-10-CM | POA: Diagnosis not present

## 2019-11-26 DIAGNOSIS — R2689 Other abnormalities of gait and mobility: Secondary | ICD-10-CM | POA: Diagnosis not present

## 2019-11-26 DIAGNOSIS — I5022 Chronic systolic (congestive) heart failure: Secondary | ICD-10-CM | POA: Diagnosis not present

## 2019-11-26 DIAGNOSIS — I482 Chronic atrial fibrillation, unspecified: Secondary | ICD-10-CM | POA: Diagnosis not present

## 2019-11-26 DIAGNOSIS — M6281 Muscle weakness (generalized): Secondary | ICD-10-CM | POA: Diagnosis not present

## 2019-11-26 DIAGNOSIS — M4327 Fusion of spine, lumbosacral region: Secondary | ICD-10-CM | POA: Diagnosis not present

## 2019-11-29 DIAGNOSIS — M4327 Fusion of spine, lumbosacral region: Secondary | ICD-10-CM | POA: Diagnosis not present

## 2019-11-29 DIAGNOSIS — R2689 Other abnormalities of gait and mobility: Secondary | ICD-10-CM | POA: Diagnosis not present

## 2019-11-29 DIAGNOSIS — M6281 Muscle weakness (generalized): Secondary | ICD-10-CM | POA: Diagnosis not present

## 2019-11-29 DIAGNOSIS — R278 Other lack of coordination: Secondary | ICD-10-CM | POA: Diagnosis not present

## 2019-11-29 DIAGNOSIS — I5022 Chronic systolic (congestive) heart failure: Secondary | ICD-10-CM | POA: Diagnosis not present

## 2019-11-29 DIAGNOSIS — I482 Chronic atrial fibrillation, unspecified: Secondary | ICD-10-CM | POA: Diagnosis not present

## 2019-11-30 ENCOUNTER — Other Ambulatory Visit: Payer: Medicare Other

## 2019-11-30 ENCOUNTER — Inpatient Hospital Stay
Admission: RE | Admit: 2019-11-30 | Discharge: 2019-11-30 | Disposition: A | Discharge status: Home / Self Care | Payer: Medicare Other | Source: Ambulatory Visit | Attending: Physician Assistant | Admitting: Physician Assistant

## 2019-11-30 DIAGNOSIS — R278 Other lack of coordination: Secondary | ICD-10-CM | POA: Diagnosis not present

## 2019-11-30 DIAGNOSIS — M4327 Fusion of spine, lumbosacral region: Secondary | ICD-10-CM | POA: Diagnosis not present

## 2019-11-30 DIAGNOSIS — M6281 Muscle weakness (generalized): Secondary | ICD-10-CM | POA: Diagnosis not present

## 2019-11-30 DIAGNOSIS — R2689 Other abnormalities of gait and mobility: Secondary | ICD-10-CM | POA: Diagnosis not present

## 2019-11-30 DIAGNOSIS — I482 Chronic atrial fibrillation, unspecified: Secondary | ICD-10-CM | POA: Diagnosis not present

## 2019-11-30 DIAGNOSIS — I5022 Chronic systolic (congestive) heart failure: Secondary | ICD-10-CM | POA: Diagnosis not present

## 2019-11-30 NOTE — Discharge Instructions (Signed)
Myelogram Discharge Instructions  1. Go home and rest quietly for the next 24 hours.  It is important to lie flat for the next 24 hours.  Get up only to go to the restroom.  You may lie in the bed or on a couch on your back, your stomach, your left side or your right side.  You may have one pillow under your head.  You may have pillows between your knees while you are on your side or under your knees while you are on your back.  2. DO NOT drive today.  Recline the seat as far back as it will go, while still wearing your seat belt, on the way home.  3. You may get up to go to the bathroom as needed.  You may sit up for 10 minutes to eat.  You may resume your normal diet and medications unless otherwise indicated.  Drink lots of extra fluids today and tomorrow.  4. The incidence of headache, nausea, or vomiting is about 5% (one in 20 patients).  If you develop a headache, lie flat and drink plenty of fluids until the headache goes away.  Caffeinated beverages may be helpful.  If you develop severe nausea and vomiting or a headache that does not go away with flat bed rest, call [phone].  5. You may resume normal activities after your 24 hours of bed rest is over; however, do not exert yourself strongly or do any heavy lifting tomorrow. If when you get up you have a headache when standing, go back to bed and force fluids for another 24 hours.  6. Call your physician for a follow-up appointment.  The results of your myelogram will be sent directly to your physician by the following day.  7. If you have any questions or if complications develop after you arrive home, please call [phone].  Discharge instructions have been explained to the patient.  The patient, or the person responsible for the patient, fully understands these instructions.  YOU MAY RESTART YOUR METOCLOPRAMIDE AND VIIBRYD TOMORROW 12/01/2019 AT 09:30AM.

## 2019-12-01 DIAGNOSIS — M4327 Fusion of spine, lumbosacral region: Secondary | ICD-10-CM | POA: Diagnosis not present

## 2019-12-01 DIAGNOSIS — R2689 Other abnormalities of gait and mobility: Secondary | ICD-10-CM | POA: Diagnosis not present

## 2019-12-01 DIAGNOSIS — I509 Heart failure, unspecified: Secondary | ICD-10-CM | POA: Diagnosis not present

## 2019-12-01 DIAGNOSIS — I482 Chronic atrial fibrillation, unspecified: Secondary | ICD-10-CM | POA: Diagnosis not present

## 2019-12-01 DIAGNOSIS — R278 Other lack of coordination: Secondary | ICD-10-CM | POA: Diagnosis not present

## 2019-12-01 DIAGNOSIS — E1169 Type 2 diabetes mellitus with other specified complication: Secondary | ICD-10-CM | POA: Diagnosis not present

## 2019-12-01 DIAGNOSIS — F329 Major depressive disorder, single episode, unspecified: Secondary | ICD-10-CM | POA: Diagnosis not present

## 2019-12-01 DIAGNOSIS — M6281 Muscle weakness (generalized): Secondary | ICD-10-CM | POA: Diagnosis not present

## 2019-12-01 DIAGNOSIS — I4891 Unspecified atrial fibrillation: Secondary | ICD-10-CM | POA: Diagnosis not present

## 2019-12-01 DIAGNOSIS — I5022 Chronic systolic (congestive) heart failure: Secondary | ICD-10-CM | POA: Diagnosis not present

## 2019-12-02 ENCOUNTER — Other Ambulatory Visit: Payer: Medicare Other

## 2019-12-02 ENCOUNTER — Inpatient Hospital Stay
Admission: RE | Admit: 2019-12-02 | Discharge: 2019-12-02 | Disposition: A | Discharge status: Home / Self Care | Payer: Medicare Other | Source: Ambulatory Visit | Attending: Physician Assistant | Admitting: Physician Assistant

## 2019-12-02 DIAGNOSIS — I482 Chronic atrial fibrillation, unspecified: Secondary | ICD-10-CM | POA: Diagnosis not present

## 2019-12-02 DIAGNOSIS — R5381 Other malaise: Secondary | ICD-10-CM | POA: Diagnosis not present

## 2019-12-02 DIAGNOSIS — M4327 Fusion of spine, lumbosacral region: Secondary | ICD-10-CM | POA: Diagnosis not present

## 2019-12-02 DIAGNOSIS — Z7401 Bed confinement status: Secondary | ICD-10-CM | POA: Diagnosis not present

## 2019-12-02 DIAGNOSIS — I5022 Chronic systolic (congestive) heart failure: Secondary | ICD-10-CM | POA: Diagnosis not present

## 2019-12-02 DIAGNOSIS — R52 Pain, unspecified: Secondary | ICD-10-CM | POA: Diagnosis not present

## 2019-12-02 DIAGNOSIS — R2689 Other abnormalities of gait and mobility: Secondary | ICD-10-CM | POA: Diagnosis not present

## 2019-12-02 DIAGNOSIS — R278 Other lack of coordination: Secondary | ICD-10-CM | POA: Diagnosis not present

## 2019-12-02 DIAGNOSIS — M6281 Muscle weakness (generalized): Secondary | ICD-10-CM | POA: Diagnosis not present

## 2019-12-02 DIAGNOSIS — M2550 Pain in unspecified joint: Secondary | ICD-10-CM | POA: Diagnosis not present

## 2019-12-02 NOTE — Discharge Instructions (Signed)
Myelogram Discharge Instructions  1. Go home and rest quietly for the next 24 hours.  It is important to lie flat for the next 24 hours.  Get up only to go to the restroom.  You may lie in the bed or on a couch on your back, your stomach, your left side or your right side.  You may have one pillow under your head.  You may have pillows between your knees while you are on your side or under your knees while you are on your back.  2. DO NOT drive today.  Recline the seat as far back as it will go, while still wearing your seat belt, on the way home.  3. You may get up to go to the bathroom as needed.  You may sit up for 10 minutes to eat.  You may resume your normal diet and medications unless otherwise indicated.  Drink lots of extra fluids today and tomorrow.  4. The incidence of headache, nausea, or vomiting is about 5% (one in 20 patients).  If you develop a headache, lie flat and drink plenty of fluids until the headache goes away.  Caffeinated beverages may be helpful.  If you develop severe nausea and vomiting or a headache that does not go away with flat bed rest, call [phone].  5. You may resume normal activities after your 24 hours of bed rest is over; however, do not exert yourself strongly or do any heavy lifting tomorrow. If when you get up you have a headache when standing, go back to bed and force fluids for another 24 hours.  6. Call your physician for a follow-up appointment.  The results of your myelogram will be sent directly to your physician by the following day.  7. If you have any questions or if complications develop after you arrive home, please call [phone].  Discharge instructions have been explained to the patient.  The patient, or the person responsible for the patient, fully understands these instructions.  YOU MAY RESTART YOUR VIIBRYD AND METOCLOPRAMIDE TOMORROW 12/03/19 AT 1:00PM.

## 2019-12-06 DIAGNOSIS — M4327 Fusion of spine, lumbosacral region: Secondary | ICD-10-CM | POA: Diagnosis not present

## 2019-12-06 DIAGNOSIS — M6281 Muscle weakness (generalized): Secondary | ICD-10-CM | POA: Diagnosis not present

## 2019-12-06 DIAGNOSIS — I482 Chronic atrial fibrillation, unspecified: Secondary | ICD-10-CM | POA: Diagnosis not present

## 2019-12-06 DIAGNOSIS — R278 Other lack of coordination: Secondary | ICD-10-CM | POA: Diagnosis not present

## 2019-12-06 DIAGNOSIS — R2689 Other abnormalities of gait and mobility: Secondary | ICD-10-CM | POA: Diagnosis not present

## 2019-12-06 DIAGNOSIS — I5022 Chronic systolic (congestive) heart failure: Secondary | ICD-10-CM | POA: Diagnosis not present

## 2019-12-07 DIAGNOSIS — F4321 Adjustment disorder with depressed mood: Secondary | ICD-10-CM | POA: Diagnosis not present

## 2019-12-15 ENCOUNTER — Other Ambulatory Visit: Payer: Medicare Other

## 2019-12-15 ENCOUNTER — Inpatient Hospital Stay
Admission: RE | Admit: 2019-12-15 | Discharge: 2019-12-15 | Disposition: A | Discharge status: Home / Self Care | Payer: Medicare Other | Source: Ambulatory Visit | Attending: Physician Assistant | Admitting: Physician Assistant

## 2019-12-15 NOTE — Discharge Instructions (Signed)
Myelogram Discharge Instructions  1. Go home and rest quietly for the next 24 hours.  It is important to lie flat for the next 24 hours.  Get up only to go to the restroom.  You may lie in the bed or on a couch on your back, your stomach, your left side or your right side.  You may have one pillow under your head.  You may have pillows between your knees while you are on your side or under your knees while you are on your back.  2. DO NOT drive today.  Recline the seat as far back as it will go, while still wearing your seat belt, on the way home.  3. You may get up to go to the bathroom as needed.  You may sit up for 10 minutes to eat.  You may resume your normal diet and medications unless otherwise indicated.  Drink plenty of extra fluids today and tomorrow.  4. The incidence of a spinal headache with nausea and/or vomiting is about 5% (one in 20 patients).  If you develop a headache, lie flat and drink plenty of fluids until the headache goes away.  Caffeinated beverages may be helpful.  If you develop severe nausea and vomiting or a headache that does not go away with flat bed rest, call [phone].  5. You may resume normal activities after your 24 hours of bed rest is over; however, do not exert yourself strongly or do any heavy lifting tomorrow.  6. Call your physician for a follow-up appointment.    You may resume Metoclopramide and Viibryd on Thursday, December 16, 2019 after 1:00p.m.

## 2019-12-16 DIAGNOSIS — J449 Chronic obstructive pulmonary disease, unspecified: Secondary | ICD-10-CM | POA: Diagnosis not present

## 2019-12-16 DIAGNOSIS — L03031 Cellulitis of right toe: Secondary | ICD-10-CM | POA: Diagnosis not present

## 2019-12-16 DIAGNOSIS — S91209A Unspecified open wound of unspecified toe(s) with damage to nail, initial encounter: Secondary | ICD-10-CM | POA: Diagnosis not present

## 2019-12-16 DIAGNOSIS — E1169 Type 2 diabetes mellitus with other specified complication: Secondary | ICD-10-CM | POA: Diagnosis not present

## 2019-12-16 DIAGNOSIS — F4321 Adjustment disorder with depressed mood: Secondary | ICD-10-CM | POA: Diagnosis not present

## 2019-12-21 DIAGNOSIS — I10 Essential (primary) hypertension: Secondary | ICD-10-CM | POA: Diagnosis not present

## 2019-12-21 DIAGNOSIS — E1143 Type 2 diabetes mellitus with diabetic autonomic (poly)neuropathy: Secondary | ICD-10-CM | POA: Diagnosis not present

## 2019-12-21 DIAGNOSIS — F329 Major depressive disorder, single episode, unspecified: Secondary | ICD-10-CM | POA: Diagnosis not present

## 2019-12-21 DIAGNOSIS — I509 Heart failure, unspecified: Secondary | ICD-10-CM | POA: Diagnosis not present

## 2019-12-21 DIAGNOSIS — E1169 Type 2 diabetes mellitus with other specified complication: Secondary | ICD-10-CM | POA: Diagnosis not present

## 2019-12-21 DIAGNOSIS — J449 Chronic obstructive pulmonary disease, unspecified: Secondary | ICD-10-CM | POA: Diagnosis not present

## 2019-12-21 DIAGNOSIS — I4891 Unspecified atrial fibrillation: Secondary | ICD-10-CM | POA: Diagnosis not present

## 2019-12-27 DIAGNOSIS — F4321 Adjustment disorder with depressed mood: Secondary | ICD-10-CM | POA: Diagnosis not present

## 2020-01-10 DIAGNOSIS — M6281 Muscle weakness (generalized): Secondary | ICD-10-CM | POA: Diagnosis not present

## 2020-01-10 DIAGNOSIS — I482 Chronic atrial fibrillation, unspecified: Secondary | ICD-10-CM | POA: Diagnosis not present

## 2020-01-10 DIAGNOSIS — M4327 Fusion of spine, lumbosacral region: Secondary | ICD-10-CM | POA: Diagnosis not present

## 2020-01-10 DIAGNOSIS — R293 Abnormal posture: Secondary | ICD-10-CM | POA: Diagnosis not present

## 2020-01-10 DIAGNOSIS — R278 Other lack of coordination: Secondary | ICD-10-CM | POA: Diagnosis not present

## 2020-01-10 DIAGNOSIS — Z741 Need for assistance with personal care: Secondary | ICD-10-CM | POA: Diagnosis not present

## 2020-01-10 DIAGNOSIS — R2689 Other abnormalities of gait and mobility: Secondary | ICD-10-CM | POA: Diagnosis not present

## 2020-01-10 DIAGNOSIS — I5022 Chronic systolic (congestive) heart failure: Secondary | ICD-10-CM | POA: Diagnosis not present

## 2020-01-11 DIAGNOSIS — M4327 Fusion of spine, lumbosacral region: Secondary | ICD-10-CM | POA: Diagnosis not present

## 2020-01-11 DIAGNOSIS — M6281 Muscle weakness (generalized): Secondary | ICD-10-CM | POA: Diagnosis not present

## 2020-01-11 DIAGNOSIS — R2689 Other abnormalities of gait and mobility: Secondary | ICD-10-CM | POA: Diagnosis not present

## 2020-01-11 DIAGNOSIS — R278 Other lack of coordination: Secondary | ICD-10-CM | POA: Diagnosis not present

## 2020-01-11 DIAGNOSIS — F4321 Adjustment disorder with depressed mood: Secondary | ICD-10-CM | POA: Diagnosis not present

## 2020-01-11 DIAGNOSIS — I482 Chronic atrial fibrillation, unspecified: Secondary | ICD-10-CM | POA: Diagnosis not present

## 2020-01-11 DIAGNOSIS — I5022 Chronic systolic (congestive) heart failure: Secondary | ICD-10-CM | POA: Diagnosis not present

## 2020-01-12 DIAGNOSIS — M4327 Fusion of spine, lumbosacral region: Secondary | ICD-10-CM | POA: Diagnosis not present

## 2020-01-12 DIAGNOSIS — I482 Chronic atrial fibrillation, unspecified: Secondary | ICD-10-CM | POA: Diagnosis not present

## 2020-01-12 DIAGNOSIS — R2689 Other abnormalities of gait and mobility: Secondary | ICD-10-CM | POA: Diagnosis not present

## 2020-01-12 DIAGNOSIS — I5022 Chronic systolic (congestive) heart failure: Secondary | ICD-10-CM | POA: Diagnosis not present

## 2020-01-12 DIAGNOSIS — R278 Other lack of coordination: Secondary | ICD-10-CM | POA: Diagnosis not present

## 2020-01-12 DIAGNOSIS — M6281 Muscle weakness (generalized): Secondary | ICD-10-CM | POA: Diagnosis not present

## 2020-01-13 DIAGNOSIS — I482 Chronic atrial fibrillation, unspecified: Secondary | ICD-10-CM | POA: Diagnosis not present

## 2020-01-13 DIAGNOSIS — R2689 Other abnormalities of gait and mobility: Secondary | ICD-10-CM | POA: Diagnosis not present

## 2020-01-13 DIAGNOSIS — I5022 Chronic systolic (congestive) heart failure: Secondary | ICD-10-CM | POA: Diagnosis not present

## 2020-01-13 DIAGNOSIS — M4327 Fusion of spine, lumbosacral region: Secondary | ICD-10-CM | POA: Diagnosis not present

## 2020-01-13 DIAGNOSIS — R278 Other lack of coordination: Secondary | ICD-10-CM | POA: Diagnosis not present

## 2020-01-13 DIAGNOSIS — M6281 Muscle weakness (generalized): Secondary | ICD-10-CM | POA: Diagnosis not present

## 2020-01-14 DIAGNOSIS — R278 Other lack of coordination: Secondary | ICD-10-CM | POA: Diagnosis not present

## 2020-01-14 DIAGNOSIS — R2689 Other abnormalities of gait and mobility: Secondary | ICD-10-CM | POA: Diagnosis not present

## 2020-01-14 DIAGNOSIS — M6281 Muscle weakness (generalized): Secondary | ICD-10-CM | POA: Diagnosis not present

## 2020-01-14 DIAGNOSIS — I5022 Chronic systolic (congestive) heart failure: Secondary | ICD-10-CM | POA: Diagnosis not present

## 2020-01-14 DIAGNOSIS — I482 Chronic atrial fibrillation, unspecified: Secondary | ICD-10-CM | POA: Diagnosis not present

## 2020-01-14 DIAGNOSIS — M4327 Fusion of spine, lumbosacral region: Secondary | ICD-10-CM | POA: Diagnosis not present

## 2020-01-17 DIAGNOSIS — I482 Chronic atrial fibrillation, unspecified: Secondary | ICD-10-CM | POA: Diagnosis not present

## 2020-01-17 DIAGNOSIS — M4327 Fusion of spine, lumbosacral region: Secondary | ICD-10-CM | POA: Diagnosis not present

## 2020-01-17 DIAGNOSIS — M6281 Muscle weakness (generalized): Secondary | ICD-10-CM | POA: Diagnosis not present

## 2020-01-17 DIAGNOSIS — I5022 Chronic systolic (congestive) heart failure: Secondary | ICD-10-CM | POA: Diagnosis not present

## 2020-01-17 DIAGNOSIS — R2689 Other abnormalities of gait and mobility: Secondary | ICD-10-CM | POA: Diagnosis not present

## 2020-01-17 DIAGNOSIS — R278 Other lack of coordination: Secondary | ICD-10-CM | POA: Diagnosis not present

## 2020-01-17 IMAGING — DX DG CHEST 2V
2 series · 3 of 3 positions shown · non-contrast
Comparison: Chest x-ray 05/21/2018 and chest CT 05/19/2018

CLINICAL DATA: Cough, congestion and shortness of breath for 5
days.

EXAM:
CHEST - 2 VIEW

[Series 1: chest pa · 0.14mm/px · 2 of 2 slices shown]
[im 1/2]
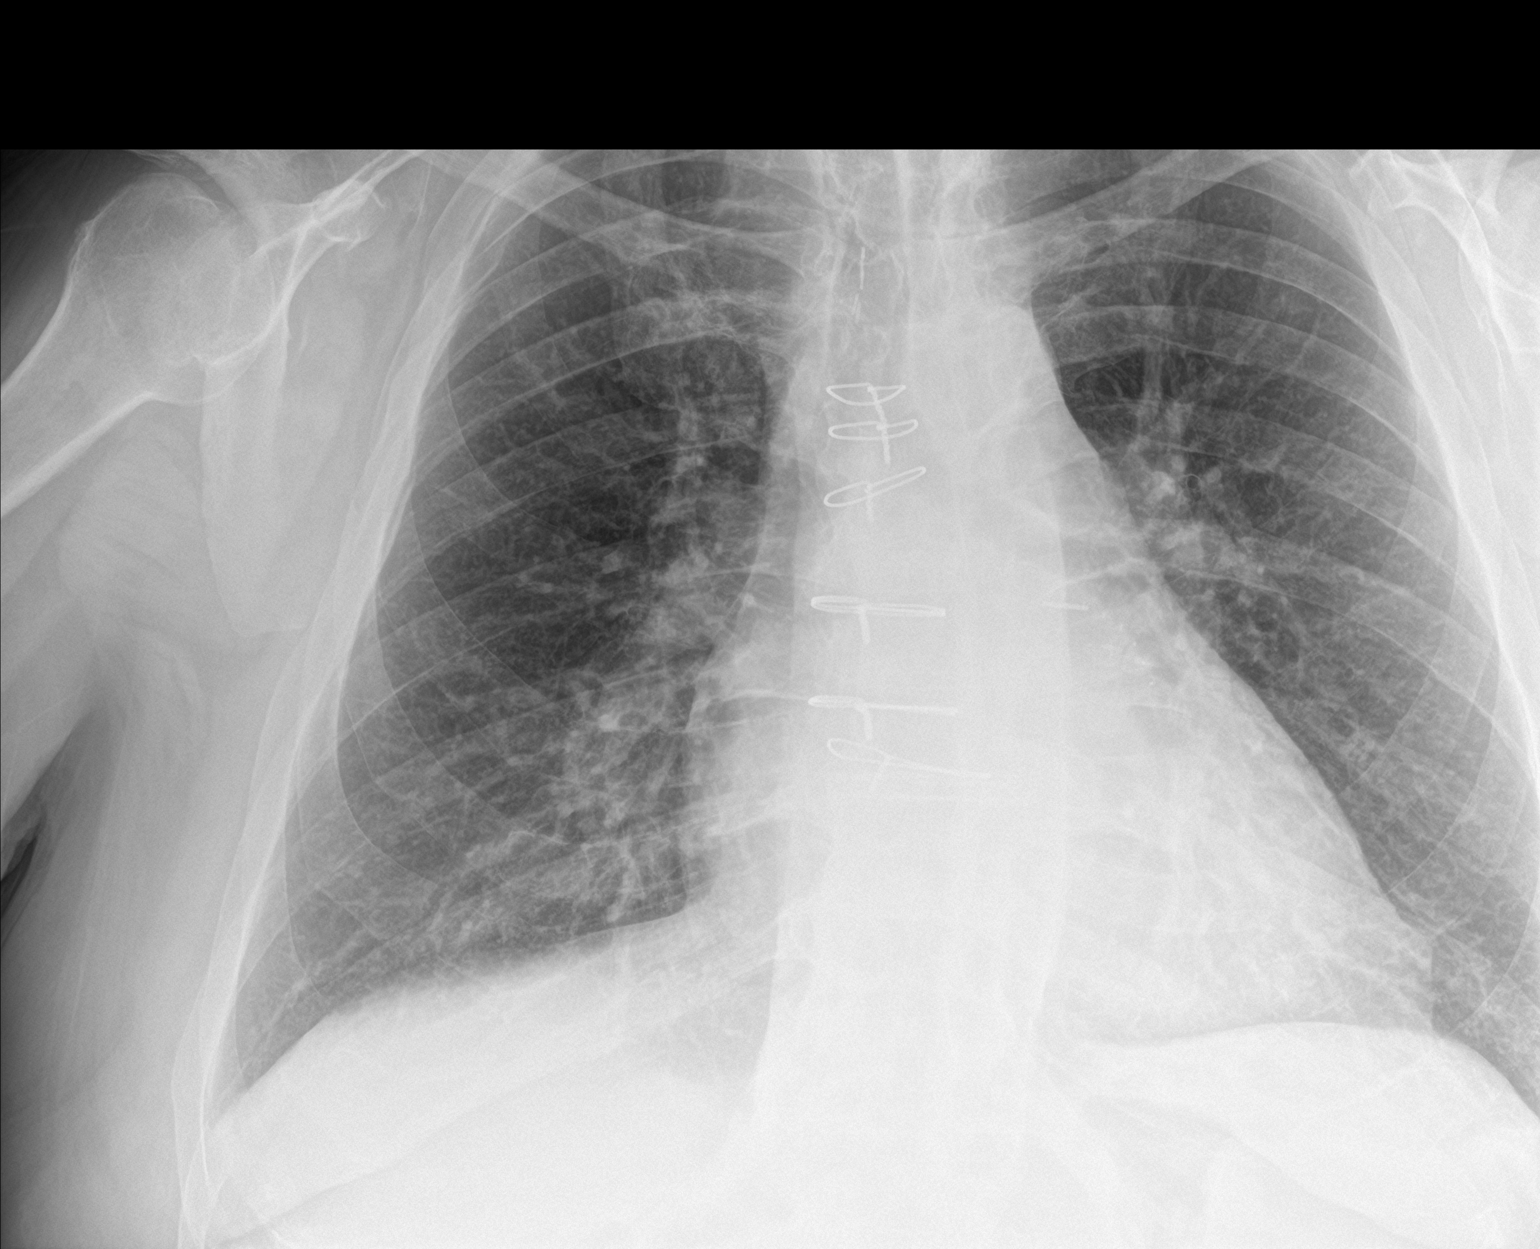
[im 2/2]
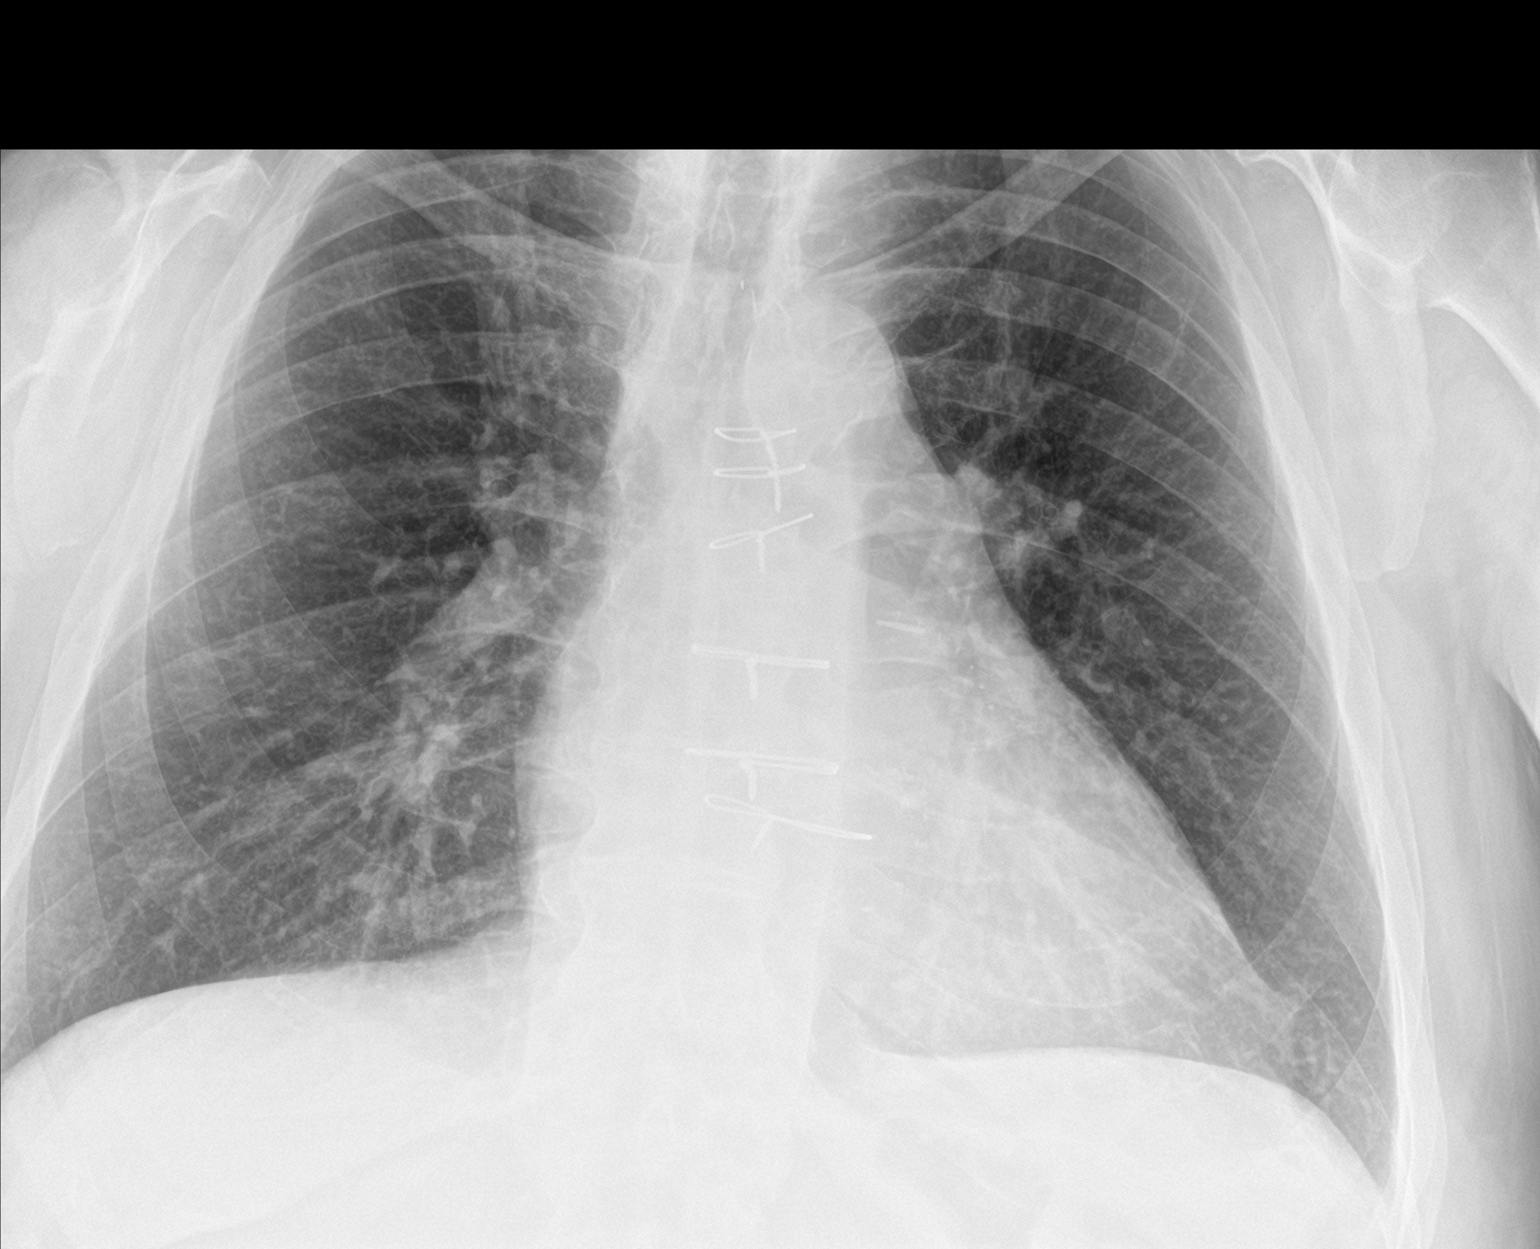

[chest lat]
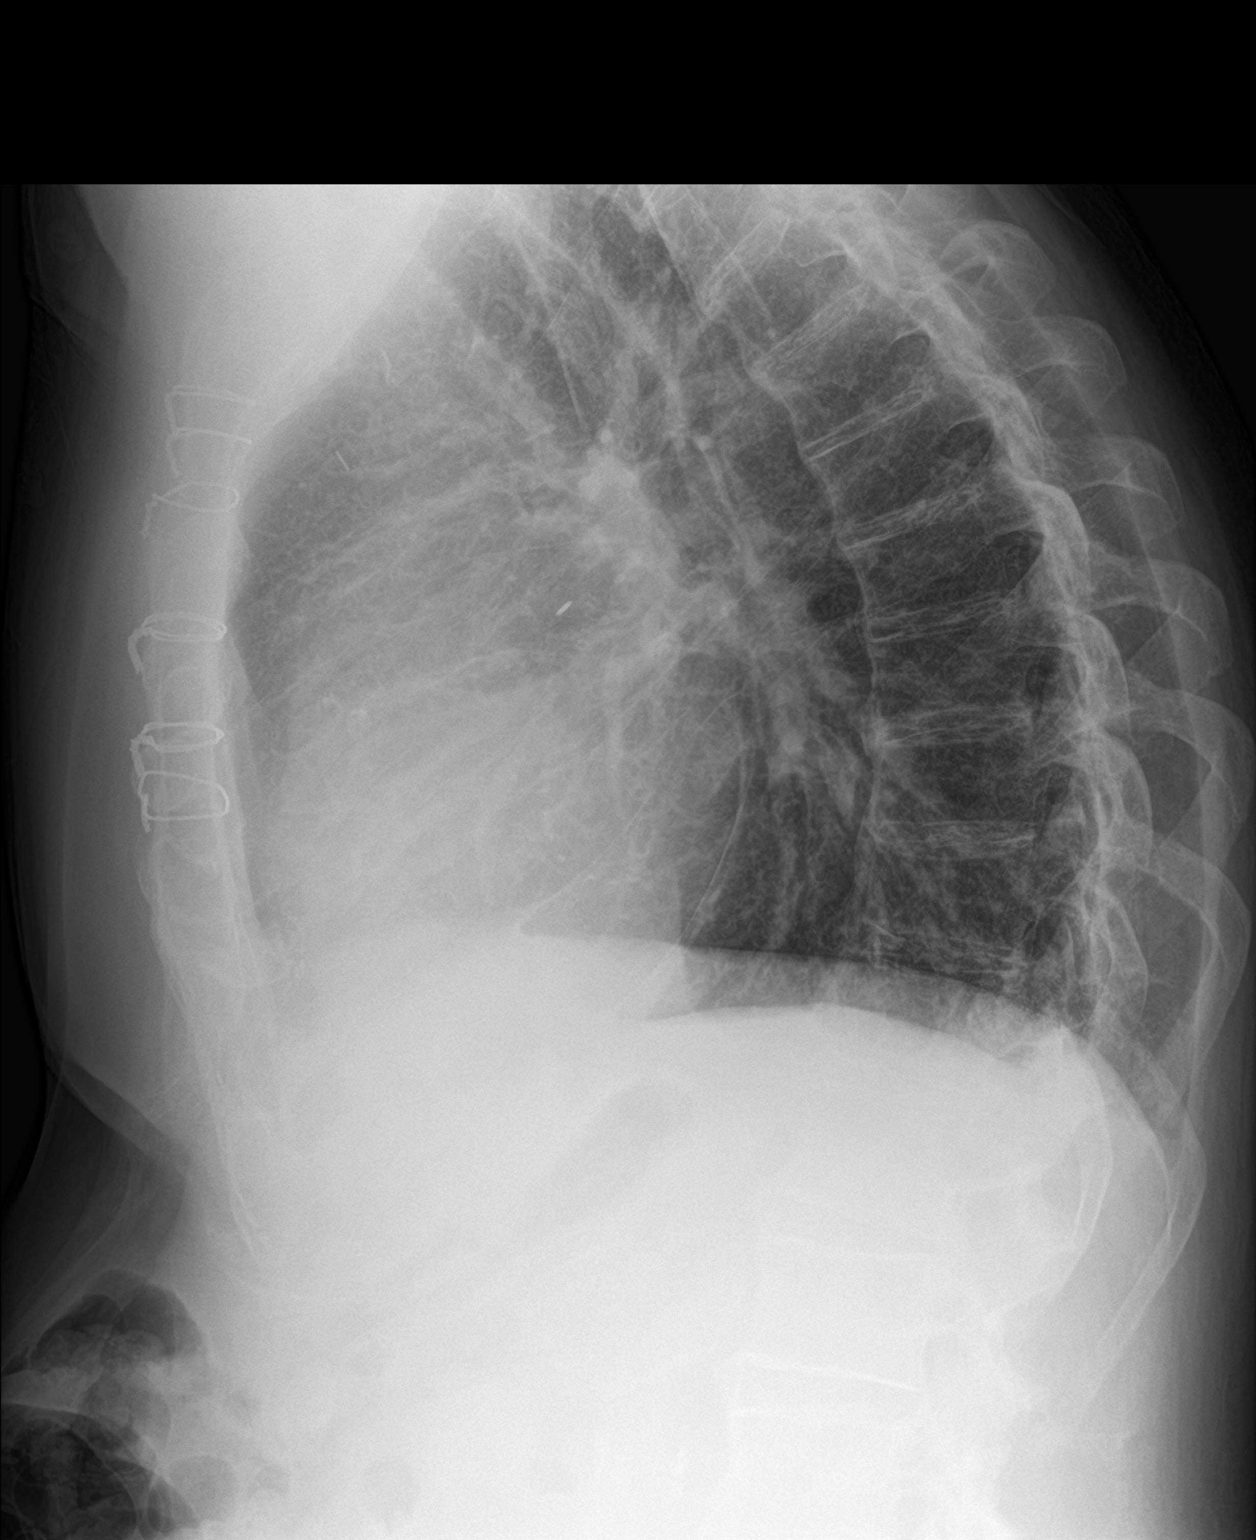

[3 of 3 positions shown; findings below may reference images not displayed]

FINDINGS: The heart is mildly enlarged but stable. Stable mild tortuosity and
calcification of the thoracic aorta.

Stable surgical changes from bypass surgery.

No acute pulmonary findings. No pleural effusion. No worrisome
pulmonary lesions. The bony thorax is intact.
IMPRESSION: No acute cardiopulmonary findings.  Mild stable cardiac enlargement.

## 2020-01-18 DIAGNOSIS — M4327 Fusion of spine, lumbosacral region: Secondary | ICD-10-CM | POA: Diagnosis not present

## 2020-01-18 DIAGNOSIS — R2689 Other abnormalities of gait and mobility: Secondary | ICD-10-CM | POA: Diagnosis not present

## 2020-01-18 DIAGNOSIS — M6281 Muscle weakness (generalized): Secondary | ICD-10-CM | POA: Diagnosis not present

## 2020-01-18 DIAGNOSIS — I5022 Chronic systolic (congestive) heart failure: Secondary | ICD-10-CM | POA: Diagnosis not present

## 2020-01-18 DIAGNOSIS — I482 Chronic atrial fibrillation, unspecified: Secondary | ICD-10-CM | POA: Diagnosis not present

## 2020-01-18 DIAGNOSIS — R278 Other lack of coordination: Secondary | ICD-10-CM | POA: Diagnosis not present

## 2020-01-19 ENCOUNTER — Ambulatory Visit
Admission: RE | Admit: 2020-01-19 | Discharge: 2020-01-19 | Disposition: A | Discharge status: Home / Self Care | Payer: Medicare Other | Source: Ambulatory Visit | Attending: Physician Assistant | Admitting: Physician Assistant

## 2020-01-19 DIAGNOSIS — R279 Unspecified lack of coordination: Secondary | ICD-10-CM | POA: Diagnosis not present

## 2020-01-19 DIAGNOSIS — I5022 Chronic systolic (congestive) heart failure: Secondary | ICD-10-CM | POA: Diagnosis not present

## 2020-01-19 DIAGNOSIS — R278 Other lack of coordination: Secondary | ICD-10-CM | POA: Diagnosis not present

## 2020-01-19 DIAGNOSIS — I482 Chronic atrial fibrillation, unspecified: Secondary | ICD-10-CM | POA: Diagnosis not present

## 2020-01-19 DIAGNOSIS — M5126 Other intervertebral disc displacement, lumbar region: Secondary | ICD-10-CM | POA: Diagnosis not present

## 2020-01-19 DIAGNOSIS — M6281 Muscle weakness (generalized): Secondary | ICD-10-CM | POA: Diagnosis not present

## 2020-01-19 DIAGNOSIS — S32012A Unstable burst fracture of first lumbar vertebra, initial encounter for closed fracture: Secondary | ICD-10-CM

## 2020-01-19 DIAGNOSIS — I10 Essential (primary) hypertension: Secondary | ICD-10-CM | POA: Diagnosis not present

## 2020-01-19 DIAGNOSIS — R531 Weakness: Secondary | ICD-10-CM | POA: Diagnosis not present

## 2020-01-19 DIAGNOSIS — R5381 Other malaise: Secondary | ICD-10-CM | POA: Diagnosis not present

## 2020-01-19 DIAGNOSIS — M4327 Fusion of spine, lumbosacral region: Secondary | ICD-10-CM | POA: Diagnosis not present

## 2020-01-19 DIAGNOSIS — M454 Ankylosing spondylitis of thoracic region: Secondary | ICD-10-CM | POA: Diagnosis not present

## 2020-01-19 DIAGNOSIS — Z743 Need for continuous supervision: Secondary | ICD-10-CM | POA: Diagnosis not present

## 2020-01-19 DIAGNOSIS — R2689 Other abnormalities of gait and mobility: Secondary | ICD-10-CM | POA: Diagnosis not present

## 2020-01-19 MED ORDER — IOPAMIDOL (ISOVUE-M 300) INJECTION 61%
10.0000 mL | Freq: Once | INTRAMUSCULAR | Status: AC
  Administered 2020-01-19: 10 mL via INTRATHECAL

## 2020-01-19 NOTE — Discharge Instructions (Signed)
Myelogram Discharge Instructions  1. Go home and rest quietly for the next 24 hours.  It is important to lie flat for the next 24 hours.  Get up only to go to the restroom.  You may lie in the bed or on a couch on your back, your stomach, your left side or your right side.  You may have one pillow under your head.  You may have pillows between your knees while you are on your side or under your knees while you are on your back.  2. DO NOT drive today.  Recline the seat as far back as it will go, while still wearing your seat belt, on the way home.  3. You may get up to go to the bathroom as needed.  You may sit up for 10 minutes to eat.  You may resume your normal diet and medications unless otherwise indicated.  Drink plenty of extra fluids today and tomorrow.  4. The incidence of a spinal headache with nausea and/or vomiting is about 5% (one in 20 patients).  If you develop a headache, lie flat and drink plenty of fluids until the headache goes away.  Caffeinated beverages may be helpful.  If you develop severe nausea and vomiting or a headache that does not go away with flat bed rest, call [phone].  5. You may resume normal activities after your 24 hours of bed rest is over; however, do not exert yourself strongly or do any heavy lifting tomorrow.  6. Call your physician for a follow-up appointment.    You may resume Metoclopramide and Viibryd on Thursday, January 20, 2020 after 1:00p.m.

## 2020-01-19 NOTE — Progress Notes (Addendum)
Per patient's MAR from facility, he has been off Reglan and Viibryd for at least the past two days.  Patient does not care for any valium before we get started with the myelogram.

## 2020-01-21 DIAGNOSIS — I5022 Chronic systolic (congestive) heart failure: Secondary | ICD-10-CM | POA: Diagnosis not present

## 2020-01-21 DIAGNOSIS — R2689 Other abnormalities of gait and mobility: Secondary | ICD-10-CM | POA: Diagnosis not present

## 2020-01-21 DIAGNOSIS — R278 Other lack of coordination: Secondary | ICD-10-CM | POA: Diagnosis not present

## 2020-01-21 DIAGNOSIS — I482 Chronic atrial fibrillation, unspecified: Secondary | ICD-10-CM | POA: Diagnosis not present

## 2020-01-21 DIAGNOSIS — M6281 Muscle weakness (generalized): Secondary | ICD-10-CM | POA: Diagnosis not present

## 2020-01-21 DIAGNOSIS — M4327 Fusion of spine, lumbosacral region: Secondary | ICD-10-CM | POA: Diagnosis not present

## 2020-01-22 DIAGNOSIS — J189 Pneumonia, unspecified organism: Secondary | ICD-10-CM | POA: Diagnosis not present

## 2020-01-24 DIAGNOSIS — I5022 Chronic systolic (congestive) heart failure: Secondary | ICD-10-CM | POA: Diagnosis not present

## 2020-01-24 DIAGNOSIS — R2689 Other abnormalities of gait and mobility: Secondary | ICD-10-CM | POA: Diagnosis not present

## 2020-01-24 DIAGNOSIS — M6281 Muscle weakness (generalized): Secondary | ICD-10-CM | POA: Diagnosis not present

## 2020-01-24 DIAGNOSIS — M4327 Fusion of spine, lumbosacral region: Secondary | ICD-10-CM | POA: Diagnosis not present

## 2020-01-24 DIAGNOSIS — R278 Other lack of coordination: Secondary | ICD-10-CM | POA: Diagnosis not present

## 2020-01-24 DIAGNOSIS — I482 Chronic atrial fibrillation, unspecified: Secondary | ICD-10-CM | POA: Diagnosis not present

## 2020-01-25 DIAGNOSIS — E1143 Type 2 diabetes mellitus with diabetic autonomic (poly)neuropathy: Secondary | ICD-10-CM | POA: Diagnosis not present

## 2020-01-25 DIAGNOSIS — I4891 Unspecified atrial fibrillation: Secondary | ICD-10-CM | POA: Diagnosis not present

## 2020-01-25 DIAGNOSIS — I5022 Chronic systolic (congestive) heart failure: Secondary | ICD-10-CM | POA: Diagnosis not present

## 2020-01-25 DIAGNOSIS — E1169 Type 2 diabetes mellitus with other specified complication: Secondary | ICD-10-CM | POA: Diagnosis not present

## 2020-01-25 DIAGNOSIS — I509 Heart failure, unspecified: Secondary | ICD-10-CM | POA: Diagnosis not present

## 2020-01-25 DIAGNOSIS — J449 Chronic obstructive pulmonary disease, unspecified: Secondary | ICD-10-CM | POA: Diagnosis not present

## 2020-01-25 DIAGNOSIS — I482 Chronic atrial fibrillation, unspecified: Secondary | ICD-10-CM | POA: Diagnosis not present

## 2020-01-25 DIAGNOSIS — I10 Essential (primary) hypertension: Secondary | ICD-10-CM | POA: Diagnosis not present

## 2020-01-25 DIAGNOSIS — R2689 Other abnormalities of gait and mobility: Secondary | ICD-10-CM | POA: Diagnosis not present

## 2020-01-25 DIAGNOSIS — M6281 Muscle weakness (generalized): Secondary | ICD-10-CM | POA: Diagnosis not present

## 2020-01-25 DIAGNOSIS — R278 Other lack of coordination: Secondary | ICD-10-CM | POA: Diagnosis not present

## 2020-01-25 DIAGNOSIS — F329 Major depressive disorder, single episode, unspecified: Secondary | ICD-10-CM | POA: Diagnosis not present

## 2020-01-25 DIAGNOSIS — M4327 Fusion of spine, lumbosacral region: Secondary | ICD-10-CM | POA: Diagnosis not present

## 2020-01-26 DIAGNOSIS — I482 Chronic atrial fibrillation, unspecified: Secondary | ICD-10-CM | POA: Diagnosis not present

## 2020-01-26 DIAGNOSIS — I5022 Chronic systolic (congestive) heart failure: Secondary | ICD-10-CM | POA: Diagnosis not present

## 2020-01-26 DIAGNOSIS — M4327 Fusion of spine, lumbosacral region: Secondary | ICD-10-CM | POA: Diagnosis not present

## 2020-01-26 DIAGNOSIS — R278 Other lack of coordination: Secondary | ICD-10-CM | POA: Diagnosis not present

## 2020-01-26 DIAGNOSIS — M6281 Muscle weakness (generalized): Secondary | ICD-10-CM | POA: Diagnosis not present

## 2020-01-26 DIAGNOSIS — R2689 Other abnormalities of gait and mobility: Secondary | ICD-10-CM | POA: Diagnosis not present

## 2020-01-27 DIAGNOSIS — R278 Other lack of coordination: Secondary | ICD-10-CM | POA: Diagnosis not present

## 2020-01-27 DIAGNOSIS — R2689 Other abnormalities of gait and mobility: Secondary | ICD-10-CM | POA: Diagnosis not present

## 2020-01-27 DIAGNOSIS — M4327 Fusion of spine, lumbosacral region: Secondary | ICD-10-CM | POA: Diagnosis not present

## 2020-01-27 DIAGNOSIS — M6281 Muscle weakness (generalized): Secondary | ICD-10-CM | POA: Diagnosis not present

## 2020-01-27 DIAGNOSIS — I482 Chronic atrial fibrillation, unspecified: Secondary | ICD-10-CM | POA: Diagnosis not present

## 2020-01-27 DIAGNOSIS — I5022 Chronic systolic (congestive) heart failure: Secondary | ICD-10-CM | POA: Diagnosis not present

## 2020-01-28 DIAGNOSIS — R2689 Other abnormalities of gait and mobility: Secondary | ICD-10-CM | POA: Diagnosis not present

## 2020-01-28 DIAGNOSIS — I482 Chronic atrial fibrillation, unspecified: Secondary | ICD-10-CM | POA: Diagnosis not present

## 2020-01-28 DIAGNOSIS — M6281 Muscle weakness (generalized): Secondary | ICD-10-CM | POA: Diagnosis not present

## 2020-01-28 DIAGNOSIS — R278 Other lack of coordination: Secondary | ICD-10-CM | POA: Diagnosis not present

## 2020-01-28 DIAGNOSIS — M4327 Fusion of spine, lumbosacral region: Secondary | ICD-10-CM | POA: Diagnosis not present

## 2020-01-28 DIAGNOSIS — I5022 Chronic systolic (congestive) heart failure: Secondary | ICD-10-CM | POA: Diagnosis not present

## 2020-01-31 DIAGNOSIS — E1169 Type 2 diabetes mellitus with other specified complication: Secondary | ICD-10-CM | POA: Diagnosis not present

## 2020-01-31 DIAGNOSIS — I5022 Chronic systolic (congestive) heart failure: Secondary | ICD-10-CM | POA: Diagnosis not present

## 2020-01-31 DIAGNOSIS — R2689 Other abnormalities of gait and mobility: Secondary | ICD-10-CM | POA: Diagnosis not present

## 2020-01-31 DIAGNOSIS — R278 Other lack of coordination: Secondary | ICD-10-CM | POA: Diagnosis not present

## 2020-01-31 DIAGNOSIS — I4891 Unspecified atrial fibrillation: Secondary | ICD-10-CM | POA: Diagnosis not present

## 2020-01-31 DIAGNOSIS — I482 Chronic atrial fibrillation, unspecified: Secondary | ICD-10-CM | POA: Diagnosis not present

## 2020-01-31 DIAGNOSIS — J449 Chronic obstructive pulmonary disease, unspecified: Secondary | ICD-10-CM | POA: Diagnosis not present

## 2020-01-31 DIAGNOSIS — I509 Heart failure, unspecified: Secondary | ICD-10-CM | POA: Diagnosis not present

## 2020-01-31 DIAGNOSIS — M6281 Muscle weakness (generalized): Secondary | ICD-10-CM | POA: Diagnosis not present

## 2020-01-31 DIAGNOSIS — M4327 Fusion of spine, lumbosacral region: Secondary | ICD-10-CM | POA: Diagnosis not present

## 2020-02-01 DIAGNOSIS — I482 Chronic atrial fibrillation, unspecified: Secondary | ICD-10-CM | POA: Diagnosis not present

## 2020-02-01 DIAGNOSIS — R2689 Other abnormalities of gait and mobility: Secondary | ICD-10-CM | POA: Diagnosis not present

## 2020-02-01 DIAGNOSIS — R278 Other lack of coordination: Secondary | ICD-10-CM | POA: Diagnosis not present

## 2020-02-01 DIAGNOSIS — I5022 Chronic systolic (congestive) heart failure: Secondary | ICD-10-CM | POA: Diagnosis not present

## 2020-02-01 DIAGNOSIS — M4327 Fusion of spine, lumbosacral region: Secondary | ICD-10-CM | POA: Diagnosis not present

## 2020-02-01 DIAGNOSIS — M6281 Muscle weakness (generalized): Secondary | ICD-10-CM | POA: Diagnosis not present

## 2020-02-02 DIAGNOSIS — R2689 Other abnormalities of gait and mobility: Secondary | ICD-10-CM | POA: Diagnosis not present

## 2020-02-02 DIAGNOSIS — M4327 Fusion of spine, lumbosacral region: Secondary | ICD-10-CM | POA: Diagnosis not present

## 2020-02-02 DIAGNOSIS — M6281 Muscle weakness (generalized): Secondary | ICD-10-CM | POA: Diagnosis not present

## 2020-02-02 DIAGNOSIS — R278 Other lack of coordination: Secondary | ICD-10-CM | POA: Diagnosis not present

## 2020-02-02 DIAGNOSIS — I5022 Chronic systolic (congestive) heart failure: Secondary | ICD-10-CM | POA: Diagnosis not present

## 2020-02-02 DIAGNOSIS — I482 Chronic atrial fibrillation, unspecified: Secondary | ICD-10-CM | POA: Diagnosis not present

## 2020-02-03 DIAGNOSIS — I5022 Chronic systolic (congestive) heart failure: Secondary | ICD-10-CM | POA: Diagnosis not present

## 2020-02-03 DIAGNOSIS — M6281 Muscle weakness (generalized): Secondary | ICD-10-CM | POA: Diagnosis not present

## 2020-02-03 DIAGNOSIS — R2689 Other abnormalities of gait and mobility: Secondary | ICD-10-CM | POA: Diagnosis not present

## 2020-02-03 DIAGNOSIS — M4327 Fusion of spine, lumbosacral region: Secondary | ICD-10-CM | POA: Diagnosis not present

## 2020-02-03 DIAGNOSIS — R278 Other lack of coordination: Secondary | ICD-10-CM | POA: Diagnosis not present

## 2020-02-03 DIAGNOSIS — I482 Chronic atrial fibrillation, unspecified: Secondary | ICD-10-CM | POA: Diagnosis not present

## 2020-02-04 DIAGNOSIS — R278 Other lack of coordination: Secondary | ICD-10-CM | POA: Diagnosis not present

## 2020-02-04 DIAGNOSIS — M6281 Muscle weakness (generalized): Secondary | ICD-10-CM | POA: Diagnosis not present

## 2020-02-04 DIAGNOSIS — I5022 Chronic systolic (congestive) heart failure: Secondary | ICD-10-CM | POA: Diagnosis not present

## 2020-02-04 DIAGNOSIS — R2689 Other abnormalities of gait and mobility: Secondary | ICD-10-CM | POA: Diagnosis not present

## 2020-02-04 DIAGNOSIS — M4327 Fusion of spine, lumbosacral region: Secondary | ICD-10-CM | POA: Diagnosis not present

## 2020-02-04 DIAGNOSIS — I482 Chronic atrial fibrillation, unspecified: Secondary | ICD-10-CM | POA: Diagnosis not present

## 2020-02-07 DIAGNOSIS — E1169 Type 2 diabetes mellitus with other specified complication: Secondary | ICD-10-CM | POA: Diagnosis not present

## 2020-02-07 DIAGNOSIS — J302 Other seasonal allergic rhinitis: Secondary | ICD-10-CM | POA: Diagnosis not present

## 2020-02-07 DIAGNOSIS — J449 Chronic obstructive pulmonary disease, unspecified: Secondary | ICD-10-CM | POA: Diagnosis not present

## 2020-02-07 DIAGNOSIS — I509 Heart failure, unspecified: Secondary | ICD-10-CM | POA: Diagnosis not present

## 2020-02-07 DIAGNOSIS — I10 Essential (primary) hypertension: Secondary | ICD-10-CM | POA: Diagnosis not present

## 2020-02-07 DIAGNOSIS — I4891 Unspecified atrial fibrillation: Secondary | ICD-10-CM | POA: Diagnosis not present

## 2020-02-09 DIAGNOSIS — Z79899 Other long term (current) drug therapy: Secondary | ICD-10-CM | POA: Diagnosis not present

## 2020-02-09 DIAGNOSIS — I110 Hypertensive heart disease with heart failure: Secondary | ICD-10-CM | POA: Diagnosis not present

## 2020-02-09 DIAGNOSIS — S32000D Wedge compression fracture of unspecified lumbar vertebra, subsequent encounter for fracture with routine healing: Secondary | ICD-10-CM | POA: Diagnosis not present

## 2020-02-09 DIAGNOSIS — G4733 Obstructive sleep apnea (adult) (pediatric): Secondary | ICD-10-CM | POA: Diagnosis not present

## 2020-02-09 DIAGNOSIS — Z981 Arthrodesis status: Secondary | ICD-10-CM | POA: Diagnosis not present

## 2020-02-09 DIAGNOSIS — R262 Difficulty in walking, not elsewhere classified: Secondary | ICD-10-CM | POA: Diagnosis not present

## 2020-02-09 DIAGNOSIS — Z87891 Personal history of nicotine dependence: Secondary | ICD-10-CM | POA: Diagnosis not present

## 2020-02-09 DIAGNOSIS — J449 Chronic obstructive pulmonary disease, unspecified: Secondary | ICD-10-CM | POA: Diagnosis not present

## 2020-02-09 DIAGNOSIS — E1143 Type 2 diabetes mellitus with diabetic autonomic (poly)neuropathy: Secondary | ICD-10-CM | POA: Diagnosis not present

## 2020-02-09 DIAGNOSIS — I872 Venous insufficiency (chronic) (peripheral): Secondary | ICD-10-CM | POA: Diagnosis not present

## 2020-02-09 DIAGNOSIS — I482 Chronic atrial fibrillation, unspecified: Secondary | ICD-10-CM | POA: Diagnosis not present

## 2020-02-09 DIAGNOSIS — K3184 Gastroparesis: Secondary | ICD-10-CM | POA: Diagnosis not present

## 2020-02-09 DIAGNOSIS — Z9989 Dependence on other enabling machines and devices: Secondary | ICD-10-CM | POA: Diagnosis not present

## 2020-02-09 DIAGNOSIS — I5022 Chronic systolic (congestive) heart failure: Secondary | ICD-10-CM | POA: Diagnosis not present

## 2020-02-09 DIAGNOSIS — M170 Bilateral primary osteoarthritis of knee: Secondary | ICD-10-CM | POA: Diagnosis not present

## 2020-02-09 DIAGNOSIS — E1151 Type 2 diabetes mellitus with diabetic peripheral angiopathy without gangrene: Secondary | ICD-10-CM | POA: Diagnosis not present

## 2020-02-09 DIAGNOSIS — I420 Dilated cardiomyopathy: Secondary | ICD-10-CM | POA: Diagnosis not present

## 2020-02-09 DIAGNOSIS — M109 Gout, unspecified: Secondary | ICD-10-CM | POA: Diagnosis not present

## 2020-02-09 DIAGNOSIS — S90811D Abrasion, right foot, subsequent encounter: Secondary | ICD-10-CM | POA: Diagnosis not present

## 2020-02-09 DIAGNOSIS — Z794 Long term (current) use of insulin: Secondary | ICD-10-CM | POA: Diagnosis not present

## 2020-02-10 ENCOUNTER — Other Ambulatory Visit: Payer: Self-pay

## 2020-02-10 ENCOUNTER — Encounter (HOSPITAL_COMMUNITY): Payer: Self-pay

## 2020-02-10 ENCOUNTER — Emergency Department (HOSPITAL_COMMUNITY): Payer: Medicare Other

## 2020-02-10 ENCOUNTER — Emergency Department (HOSPITAL_COMMUNITY)
Admission: EM | Admit: 2020-02-10 | Discharge: 2020-02-10 | Disposition: A | Discharge status: Home / Self Care | Payer: Medicare Other | Attending: Emergency Medicine | Admitting: Emergency Medicine

## 2020-02-10 DIAGNOSIS — R262 Difficulty in walking, not elsewhere classified: Secondary | ICD-10-CM | POA: Diagnosis not present

## 2020-02-10 DIAGNOSIS — Y999 Unspecified external cause status: Secondary | ICD-10-CM | POA: Insufficient documentation

## 2020-02-10 DIAGNOSIS — I10 Essential (primary) hypertension: Secondary | ICD-10-CM | POA: Insufficient documentation

## 2020-02-10 DIAGNOSIS — R58 Hemorrhage, not elsewhere classified: Secondary | ICD-10-CM | POA: Diagnosis not present

## 2020-02-10 DIAGNOSIS — J449 Chronic obstructive pulmonary disease, unspecified: Secondary | ICD-10-CM | POA: Insufficient documentation

## 2020-02-10 DIAGNOSIS — S32000D Wedge compression fracture of unspecified lumbar vertebra, subsequent encounter for fracture with routine healing: Secondary | ICD-10-CM | POA: Diagnosis not present

## 2020-02-10 DIAGNOSIS — S91311A Laceration without foreign body, right foot, initial encounter: Secondary | ICD-10-CM | POA: Insufficient documentation

## 2020-02-10 DIAGNOSIS — I5022 Chronic systolic (congestive) heart failure: Secondary | ICD-10-CM | POA: Diagnosis not present

## 2020-02-10 DIAGNOSIS — W182XXA Fall in (into) shower or empty bathtub, initial encounter: Secondary | ICD-10-CM | POA: Insufficient documentation

## 2020-02-10 DIAGNOSIS — Y92091 Bathroom in other non-institutional residence as the place of occurrence of the external cause: Secondary | ICD-10-CM | POA: Diagnosis not present

## 2020-02-10 DIAGNOSIS — S0990XA Unspecified injury of head, initial encounter: Secondary | ICD-10-CM | POA: Insufficient documentation

## 2020-02-10 DIAGNOSIS — I509 Heart failure, unspecified: Secondary | ICD-10-CM | POA: Diagnosis not present

## 2020-02-10 DIAGNOSIS — I420 Dilated cardiomyopathy: Secondary | ICD-10-CM | POA: Diagnosis not present

## 2020-02-10 DIAGNOSIS — Z87891 Personal history of nicotine dependence: Secondary | ICD-10-CM | POA: Diagnosis not present

## 2020-02-10 DIAGNOSIS — E119 Type 2 diabetes mellitus without complications: Secondary | ICD-10-CM | POA: Diagnosis not present

## 2020-02-10 DIAGNOSIS — Z794 Long term (current) use of insulin: Secondary | ICD-10-CM | POA: Diagnosis not present

## 2020-02-10 DIAGNOSIS — Z79899 Other long term (current) drug therapy: Secondary | ICD-10-CM | POA: Insufficient documentation

## 2020-02-10 DIAGNOSIS — M79671 Pain in right foot: Secondary | ICD-10-CM | POA: Diagnosis not present

## 2020-02-10 DIAGNOSIS — W19XXXA Unspecified fall, initial encounter: Secondary | ICD-10-CM | POA: Diagnosis not present

## 2020-02-10 DIAGNOSIS — S99921A Unspecified injury of right foot, initial encounter: Secondary | ICD-10-CM | POA: Diagnosis not present

## 2020-02-10 DIAGNOSIS — S92425A Nondisplaced fracture of distal phalanx of left great toe, initial encounter for closed fracture: Secondary | ICD-10-CM | POA: Insufficient documentation

## 2020-02-10 DIAGNOSIS — Y939 Activity, unspecified: Secondary | ICD-10-CM | POA: Insufficient documentation

## 2020-02-10 DIAGNOSIS — I110 Hypertensive heart disease with heart failure: Secondary | ICD-10-CM | POA: Diagnosis not present

## 2020-02-10 MED ORDER — DOXYCYCLINE HYCLATE 100 MG PO CAPS: 100.0000 mg | ORAL_CAPSULE | Freq: Two times a day (BID) | ORAL | 0 refills | Status: DC

## 2020-02-10 MED ORDER — DOXYCYCLINE HYCLATE 100 MG PO TABS
100.0000 mg | ORAL_TABLET | Freq: Once | ORAL | Status: AC
  Administered 2020-02-10: 100 mg via ORAL
  Filled 2020-02-10: qty 1

## 2020-02-10 NOTE — Discharge Instructions (Addendum)
Your x-rays today shows that you have a broken bone in your left great toe.  You will need to keep this toe buddy taped and wear the postop shoe when standing or walking.  Call Dr. Mort Sawyers office to arrange a follow-up appointment if needed.  Keep the wounds of your right foot clean with mild soap and water and keep them bandaged.  Be sure to wear shoes and socks.  Follow-up with your primary doctor for recheck.  You may take Tylenol if needed for pain.
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
## 2020-02-10 NOTE — ED Triage Notes (Signed)
Pt reports had back surgery Oct of last year and was in rehab until last Tuesday.  Reports was readmitted back to [facility] and today slipped in the shower.  Pt says hit his head and kicked something in the shower cutting bottom of r foot.  Also had previous skin tear to r elbow that was reinjured.

## 2020-02-10 NOTE — ED Notes (Signed)
c-com called to transport pt back to [facility] [place] [facility]

## 2020-02-11 DIAGNOSIS — S32000D Wedge compression fracture of unspecified lumbar vertebra, subsequent encounter for fracture with routine healing: Secondary | ICD-10-CM | POA: Diagnosis not present

## 2020-02-11 DIAGNOSIS — I420 Dilated cardiomyopathy: Secondary | ICD-10-CM | POA: Diagnosis not present

## 2020-02-11 DIAGNOSIS — I110 Hypertensive heart disease with heart failure: Secondary | ICD-10-CM | POA: Diagnosis not present

## 2020-02-11 DIAGNOSIS — R262 Difficulty in walking, not elsewhere classified: Secondary | ICD-10-CM | POA: Diagnosis not present

## 2020-02-11 DIAGNOSIS — J449 Chronic obstructive pulmonary disease, unspecified: Secondary | ICD-10-CM | POA: Diagnosis not present

## 2020-02-11 DIAGNOSIS — I5022 Chronic systolic (congestive) heart failure: Secondary | ICD-10-CM | POA: Diagnosis not present

## 2020-02-14 DIAGNOSIS — I5022 Chronic systolic (congestive) heart failure: Secondary | ICD-10-CM | POA: Diagnosis not present

## 2020-02-14 DIAGNOSIS — R262 Difficulty in walking, not elsewhere classified: Secondary | ICD-10-CM | POA: Diagnosis not present

## 2020-02-14 DIAGNOSIS — J449 Chronic obstructive pulmonary disease, unspecified: Secondary | ICD-10-CM | POA: Diagnosis not present

## 2020-02-14 DIAGNOSIS — S32000D Wedge compression fracture of unspecified lumbar vertebra, subsequent encounter for fracture with routine healing: Secondary | ICD-10-CM | POA: Diagnosis not present

## 2020-02-14 DIAGNOSIS — I110 Hypertensive heart disease with heart failure: Secondary | ICD-10-CM | POA: Diagnosis not present

## 2020-02-14 DIAGNOSIS — I420 Dilated cardiomyopathy: Secondary | ICD-10-CM | POA: Diagnosis not present

## 2020-02-17 ENCOUNTER — Ambulatory Visit (INDEPENDENT_AMBULATORY_CARE_PROVIDER_SITE_OTHER): Payer: Medicare Other | Admitting: Internal Medicine

## 2020-02-17 ENCOUNTER — Encounter: Payer: Self-pay | Admitting: Internal Medicine

## 2020-02-17 ENCOUNTER — Other Ambulatory Visit: Payer: Self-pay

## 2020-02-17 DIAGNOSIS — R21 Rash and other nonspecific skin eruption: Secondary | ICD-10-CM | POA: Diagnosis not present

## 2020-02-17 DIAGNOSIS — J9611 Chronic respiratory failure with hypoxia: Secondary | ICD-10-CM | POA: Diagnosis not present

## 2020-02-17 DIAGNOSIS — E118 Type 2 diabetes mellitus with unspecified complications: Secondary | ICD-10-CM | POA: Diagnosis not present

## 2020-02-17 DIAGNOSIS — I5022 Chronic systolic (congestive) heart failure: Secondary | ICD-10-CM

## 2020-02-17 MED ORDER — MUPIROCIN 2 % EX OINT: TOPICAL_OINTMENT | CUTANEOUS | 0 refills | Status: AC

## 2020-02-17 MED ORDER — VALACYCLOVIR HCL 1 G PO TABS
1000.0000 mg | ORAL_TABLET | Freq: Three times a day (TID) | ORAL | 0 refills | Status: DC
Start: 2020-02-17 — End: 2020-08-19

## 2020-02-17 NOTE — Assessment & Plan Note (Addendum)
Possible H zoster vs other.  He is already treated for cellulitis  Finish Doxy po Valtrex x 7 d Bactroban topically

## 2020-02-17 NOTE — Progress Notes (Signed)
Subjective:  Patient ID: Cody Hale, male    DOB: 05-26-1951  Age: 69 y.o. MRN: [id number]  CC: No chief complaint on file.   HPI Cody Hale presents for rash on LLE - worse; pain in LLE - more in the thigh He is on Doxy po for L foot infection General concerns about congestive heart failure, diabetes  Outpatient Medications Prior to Visit  Medication Sig Dispense Refill  . acetaminophen (TYLENOL) 325 MG tablet Take 325 mg by mouth every 6 (six) hours as needed.    Marland Kitchen ascorbic acid (VITAMIN C) 500 MG/5ML syrup Place 5 mLs (500 mg total) into feeding tube daily. 473 mL 12  . blood glucose meter kit and supplies KIT Use to check blood sugar 3x per day. DX: E11.8 Accu Check 1 each 0  . carvedilol (COREG) 3.125 MG tablet Place 1 tablet (3.125 mg total) into feeding tube 2 (two) times daily with a meal.    . doxycycline (VIBRAMYCIN) 100 MG capsule Take 1 capsule (100 mg total) by mouth 2 (two) times daily. 20 capsule 0  . Dulaglutide (TRULICITY) 1.5 IN/8.6VE SOPN Inject 1 Act into the skin once a week. 12 pen 1  . fluticasone (FLONASE) 50 MCG/ACT nasal spray Place 2 sprays into both nostrils daily. (Patient taking differently: Place 2 sprays into both nostrils 2 (two) times daily. ) 16 g 5  . Fluticasone-Umeclidin-Vilant (TRELEGY ELLIPTA) 100-62.5-25 MCG/INH AEPB Inhale 1 puff into the lungs daily. 120 each 1  . glucose blood (COOL BLOOD GLUCOSE TEST STRIPS) test strip Use to check blood sugar 3x per day. DX: E11.8 Accu Check 300 each 3  . insulin aspart (NOVOLOG) 100 UNIT/ML injection CBG 121 - 150: 3 units CBG 151 - 200: 4 units CBG 201 - 250: 7 units CBG 251 - 300: 11 units CBG 301 - 350: 15 units CBG 351 - 400: 20 units 10 mL   . Insulin Degludec (TRESIBA FLEXTOUCH) 200 UNIT/ML SOPN Inject 50 Units into the skin daily. 9 mL 1  . Insulin Pen Needle (NOVOFINE AUTOCOVER) 30G X 8 MM MISC USE TID. 100 each 1  . ipratropium-albuterol (DUONEB) 0.5-2.5 (3) MG/3ML SOLN Take 3 mLs by  nebulization every 2 (two) hours as needed.    . Lancets (ACCU-CHEK SOFT TOUCH) lancets Use to check blood sugar 3x per day. DX: E11.8 Accu Check 300 each 3  . levocetirizine (XYZAL) 5 MG tablet Place 1 tablet (5 mg total) into feeding tube every evening. 90 tablet 1  . Magnesium Oxide 400 (240 Mg) MG TABS Give 1 tablet (400 mg total) by tube 2 (two) times daily. 90 tablet 1  . methocarbamol (ROBAXIN) 500 MG tablet Take 1 tablet (500 mg total) by mouth every 8 (eight) hours as needed for muscle spasms.    . metoCLOPramide (REGLAN) 5 MG tablet Take 5 mg by mouth 3 (three) times daily before meals.    . Multiple Vitamin (MULTIVITAMIN WITH MINERALS) TABS tablet Place 1 tablet into feeding tube daily.    . nitroGLYCERIN (NITROSTAT) 0.4 MG SL tablet Place 1 tablet (0.4 mg total) under the tongue every 5 (five) minutes as needed for chest pain. Max 3 doses. 10 tablet 0  . Nutritional Supplements (ENSURE ACTIVE PO) Take 120 mLs by mouth 2 (two) times daily.    . ondansetron (ZOFRAN) 4 MG tablet Take 4 mg by mouth every 8 (eight) hours as needed for nausea or vomiting.    Marland Kitchen oxycodone (OXY-IR) 5 MG capsule  Take 5 mg by mouth every 4 (four) hours as needed.    . OXYGEN Inhale 2 L/min into the lungs continuous.    . pantoprazole sodium (PROTONIX) 40 mg/20 mL PACK Place 20 mLs (40 mg total) into feeding tube daily. 30 mL   . rivaroxaban (XARELTO) 20 MG TABS tablet Take one tablet daily via tube. 30 tablet   . rosuvastatin (CRESTOR) 5 MG tablet Take 5 mg by mouth daily.    . tamsulosin (FLOMAX) 0.4 MG CAPS capsule Take 1 capsule (0.4 mg total) by mouth daily after breakfast. 30 capsule   . torsemide (DEMADEX) 20 MG tablet Place 1 tablet (20 mg total) into feeding tube daily at 6 PM.    . Vilazodone HCl (VIIBRYD) 40 MG TABS Take 40 mg by mouth daily.    Marland Kitchen losartan (COZAAR) 25 MG tablet Place 0.5 tablets (12.5 mg total) into feeding tube daily.     No facility-administered medications prior to visit.     ROS: Review of Systems  Constitutional: Positive for fatigue. Negative for appetite change and unexpected weight change.  HENT: Negative for congestion, nosebleeds, sneezing, sore throat and trouble swallowing.   Eyes: Negative for itching and visual disturbance.  Respiratory: Negative for cough.   Cardiovascular: Positive for leg swelling. Negative for chest pain and palpitations.  Gastrointestinal: Negative for abdominal distention, blood in stool, diarrhea and nausea.  Genitourinary: Negative for frequency and hematuria.  Musculoskeletal: Positive for gait problem. Negative for back pain, joint swelling and neck pain.  Skin: Positive for rash and wound.  Neurological: Negative for dizziness, tremors, speech difficulty and weakness.  Psychiatric/Behavioral: Negative for agitation, dysphoric mood and sleep disturbance. The patient is not nervous/anxious.     Objective:  BP (!) 104/40 (BP Location: Right Arm, Patient Position: Sitting, Cuff Size: Large)   Pulse (!) 44   Temp 98.4 F (36.9 C) (Oral)   Ht 5' 9"  (1.753 m)   Wt 225 lb (102.1 kg)   SpO2 93%   BMI 33.23 kg/m   BP Readings from Last 3 Encounters:  02/17/20 (!) 104/40  02/10/20 (!) 144/76  01/19/20 (!) 165/68    Wt Readings from Last 3 Encounters:  02/17/20 225 lb (102.1 kg)  02/10/20 240 lb (108.9 kg)  06/24/19 253 lb 14.4 oz (115.2 kg)    Physical Exam Constitutional:      General: He is not in acute distress.    Appearance: He is well-developed.     Comments: NAD  Eyes:     Conjunctiva/sclera: Conjunctivae normal.     Pupils: Pupils are equal, round, and reactive to light.  Neck:     Thyroid: No thyromegaly.     Vascular: No JVD.  Cardiovascular:     Rate and Rhythm: Normal rate and regular rhythm.     Heart sounds: Normal heart sounds. No murmur heard.  No friction rub. No gallop.   Pulmonary:     Effort: Pulmonary effort is normal. No respiratory distress.     Breath sounds: Normal breath  sounds. No wheezing or rales.  Chest:     Chest wall: No tenderness.  Abdominal:     General: Bowel sounds are normal. There is no distension.     Palpations: Abdomen is soft. There is no mass.     Tenderness: There is no abdominal tenderness. There is no guarding or rebound.  Musculoskeletal:        General: Tenderness present. Normal range of motion.     Cervical  back: Normal range of motion.  Lymphadenopathy:     Cervical: No cervical adenopathy.  Skin:    General: Skin is warm and dry.     Findings: Rash present.  Neurological:     Mental Status: He is alert.     Cranial Nerves: No cranial nerve deficit.     Motor: Weakness present. No abnormal muscle tone.     Coordination: Coordination abnormal.     Gait: Gait abnormal.     Deep Tendon Reflexes: Reflexes are normal and symmetric.  Psychiatric:        Behavior: Behavior normal.        Thought Content: Thought content normal.        Judgment: Judgment normal.    Vesicular rash on LLE - shin Foot is dressed, surg shoe    Lab Results  Component Value Date   WBC 5.7 06/18/2019   HGB 8.9 (L) 06/18/2019   HCT 29.4 (L) 06/18/2019   PLT 265 06/18/2019   GLUCOSE 147 (H) 06/18/2019   CHOL 121 03/24/2019   TRIG 181.0 (H) 03/24/2019   HDL 34.20 (L) 03/24/2019   LDLDIRECT 41.1 05/20/2019   LDLCALC 51 03/24/2019   ALT 19 05/19/2019   AST 41 05/19/2019   NA 136 06/18/2019   K 4.5 06/18/2019   CL 95 (L) 06/18/2019   CREATININE 0.66 06/18/2019   BUN 21 06/18/2019   CO2 33 (H) 06/18/2019   TSH 0.485 05/24/2019   PSA 0.36 05/30/2014   INR 1.2 05/06/2019   HGBA1C 7.4 (H) 03/24/2019   MICROALBUR 1.7 07/22/2018    CT Head Wo Contrast  Result Date: 02/10/2020 CLINICAL DATA:  Head trauma, minor. Additional provided: Fall in shower today, hitting head. EXAM: CT HEAD WITHOUT CONTRAST TECHNIQUE: Contiguous axial images were obtained from the base of the skull through the vertex without intravenous contrast. COMPARISON:  Prior  head CT examinations 05/19/2018 and earlier FINDINGS: Brain: Stable, mild generalized parenchymal atrophy. Redemonstrated 5 mm rounded hyperdense focus within the anterior aspect of the third ventricle consistent with colloid cyst. There is no associated hydrocephalus. There is no acute intracranial hemorrhage. No demarcated cortical infarct. No extra-axial fluid collection. No midline shift. Vascular: No hyperdense vessel.  Atherosclerotic calcifications. Skull: No calvarial fracture. Sinuses/Orbits: Visualized orbits show no acute finding. Mild ethmoid sinus mucosal thickening. No significant mastoid effusion. IMPRESSION: No evidence of acute intracranial abnormality. 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 No associated hydrocephalus. Stable, mild generalized parenchymal atrophy. Mild ethmoid sinus mucosal thickening. Electronically Signed   By: Kellie Simmering DO   On: 02/10/2020 17:26   DG Foot Complete Right  Result Date: 02/10/2020 CLINICAL DATA:  Pain status post fall EXAM: RIGHT FOOT COMPLETE - 3+ VIEW COMPARISON:  None. FINDINGS: There is heterogeneous osteopenia. There are degenerative changes of the interphalangeal joints as well as the first metatarsophalangeal joint. There is no acute displaced fracture or dislocation. There are degenerative changes of the midfoot. There is a large plantar calcaneal spur. There is an Achilles tendon enthesophyte. There are degenerative changes of the ankle mortise. IMPRESSION: 1. No acute displaced fracture or dislocation. 2. Osteoarthritis as above. 3. Osteopenia. 4. Large plantar calcaneal spur. Electronically Signed   By: Constance Holster M.D.   On: 02/10/2020 17:15   DG Toe Great Left  Result Date: 02/10/2020 CLINICAL DATA:  Pain EXAM: LEFT GREAT TOE COMPARISON:  None. FINDINGS: There is an acute, nondisplaced, intra-articular fracture through the base of the distal phalanx of  the first digit. There is surrounding soft tissue swelling.  There is osteopenia. There are degenerative changes at the first metatarsophalangeal joint. IMPRESSION: Acute nondisplaced intra-articular fracture through the base of the distal phalanx of the first digit. Electronically Signed   By: Constance Holster M.D.   On: 02/10/2020 17:14    Assessment & Plan:   There are no diagnoses linked to this encounter.   No orders of the defined types were placed in this encounter.    Follow-up: No follow-ups on file.  Walker Kehr, MD

## 2020-02-18 DIAGNOSIS — I5022 Chronic systolic (congestive) heart failure: Secondary | ICD-10-CM | POA: Diagnosis not present

## 2020-02-18 DIAGNOSIS — I110 Hypertensive heart disease with heart failure: Secondary | ICD-10-CM | POA: Diagnosis not present

## 2020-02-18 DIAGNOSIS — R262 Difficulty in walking, not elsewhere classified: Secondary | ICD-10-CM | POA: Diagnosis not present

## 2020-02-18 DIAGNOSIS — I420 Dilated cardiomyopathy: Secondary | ICD-10-CM | POA: Diagnosis not present

## 2020-02-18 DIAGNOSIS — J449 Chronic obstructive pulmonary disease, unspecified: Secondary | ICD-10-CM | POA: Diagnosis not present

## 2020-02-18 DIAGNOSIS — S32000D Wedge compression fracture of unspecified lumbar vertebra, subsequent encounter for fracture with routine healing: Secondary | ICD-10-CM | POA: Diagnosis not present

## 2020-02-20 ENCOUNTER — Encounter: Payer: Self-pay | Admitting: Internal Medicine

## 2020-02-20 NOTE — Assessment & Plan Note (Signed)
On oxygen 

## 2020-02-20 NOTE — Assessment & Plan Note (Signed)
Trulicity, Evaristo Bury

## 2020-02-20 NOTE — Assessment & Plan Note (Signed)
On Demadex, oxygen, Coreg

## 2020-02-21 ENCOUNTER — Telehealth: Payer: Self-pay | Admitting: Internal Medicine

## 2020-02-21 DIAGNOSIS — S32000D Wedge compression fracture of unspecified lumbar vertebra, subsequent encounter for fracture with routine healing: Secondary | ICD-10-CM | POA: Diagnosis not present

## 2020-02-21 DIAGNOSIS — I5022 Chronic systolic (congestive) heart failure: Secondary | ICD-10-CM | POA: Diagnosis not present

## 2020-02-21 DIAGNOSIS — I110 Hypertensive heart disease with heart failure: Secondary | ICD-10-CM | POA: Diagnosis not present

## 2020-02-21 DIAGNOSIS — R262 Difficulty in walking, not elsewhere classified: Secondary | ICD-10-CM | POA: Diagnosis not present

## 2020-02-21 DIAGNOSIS — I420 Dilated cardiomyopathy: Secondary | ICD-10-CM | POA: Diagnosis not present

## 2020-02-21 DIAGNOSIS — J449 Chronic obstructive pulmonary disease, unspecified: Secondary | ICD-10-CM | POA: Diagnosis not present

## 2020-02-21 IMAGING — DX DG ELBOW 2V*L*
2 series · 2 of 2 positions shown · non-contrast
Comparison: None.

CLINICAL DATA: Left elbow pain following a fall.

EXAM:
LEFT ELBOW - 2 VIEW

[elbow ap]
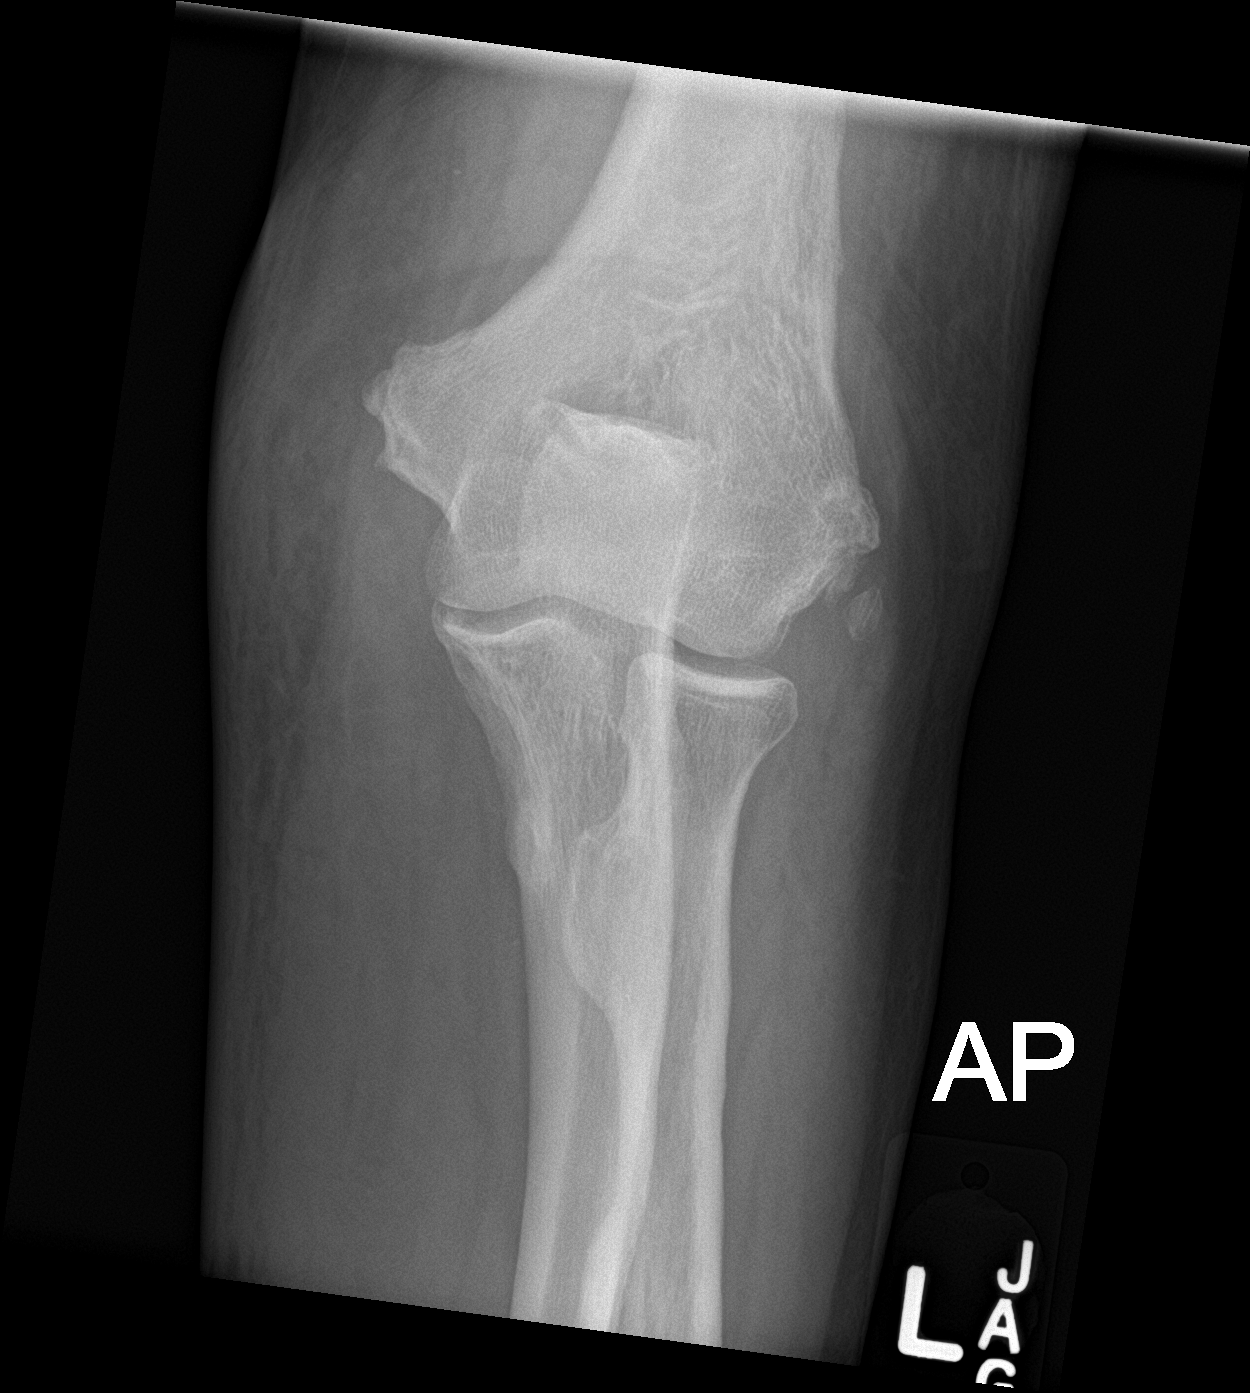

[elbow lat]
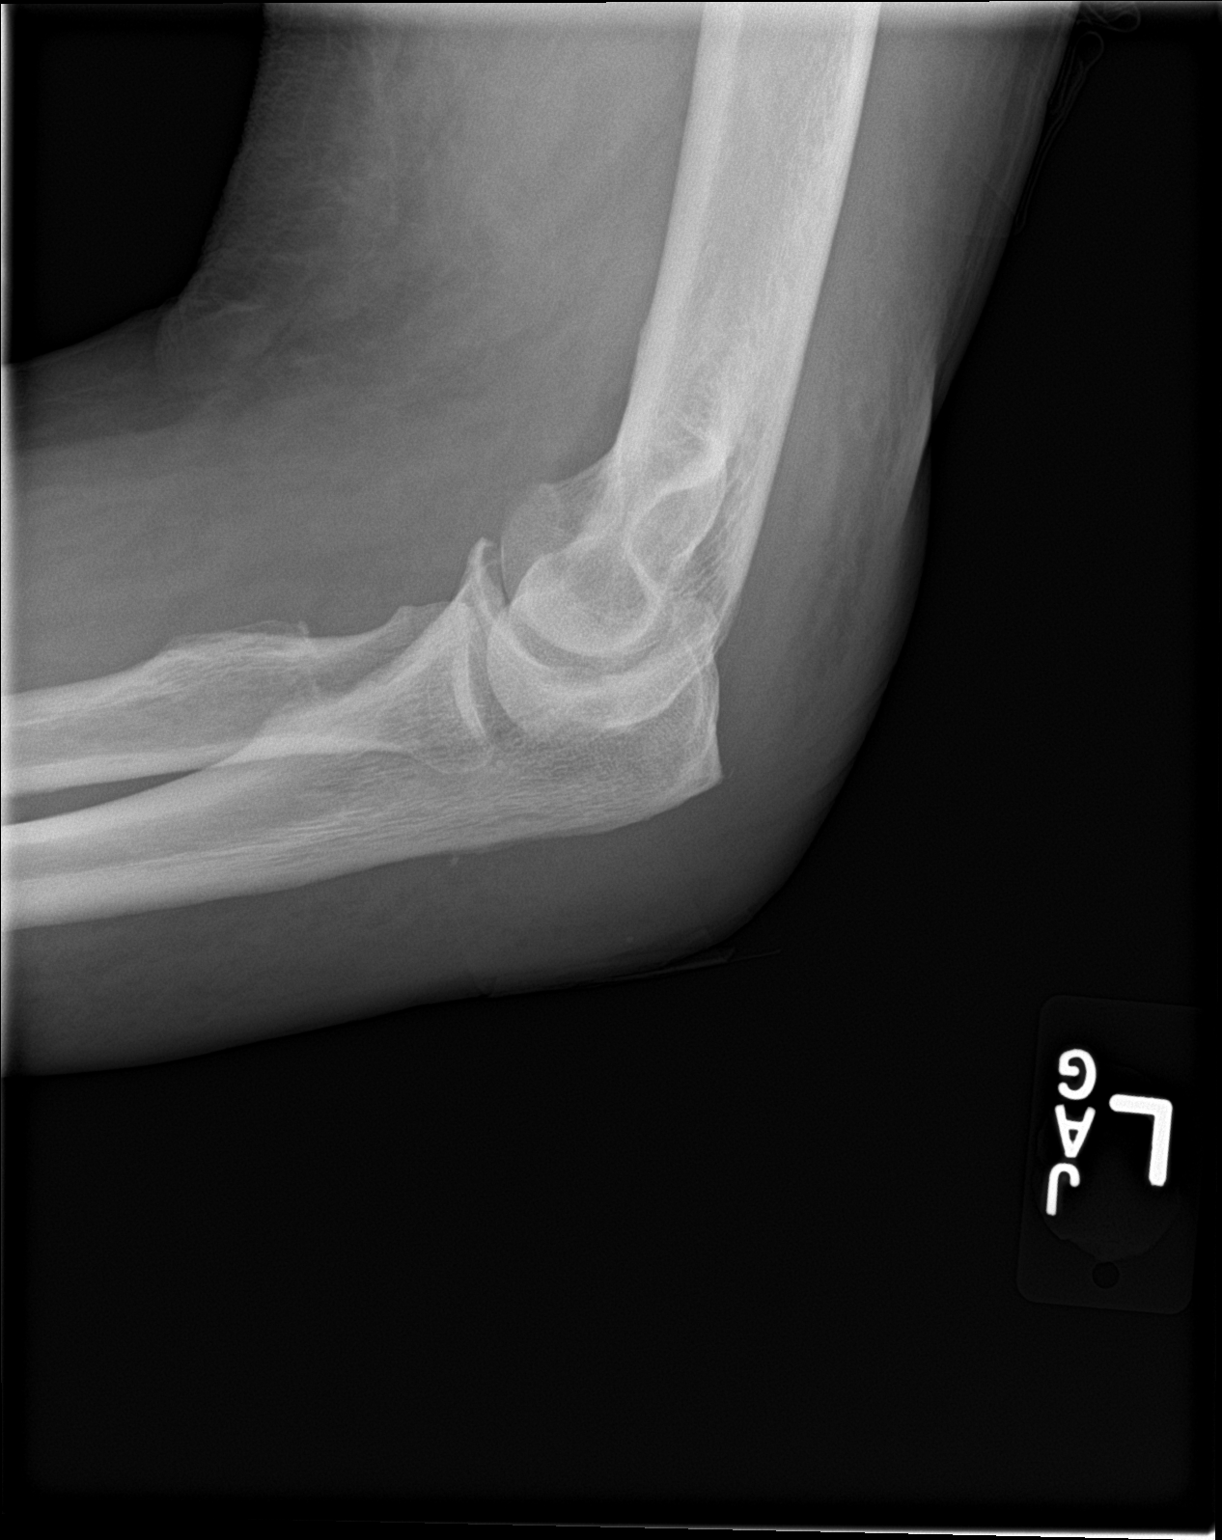

[2 of 2 positions shown; findings below may reference images not displayed]

FINDINGS: Two views of the left elbow demonstrate mild degenerative spur
formation. No fracture, dislocation or effusion is seen. There is
posterior soft tissue swelling. There are no oblique views to
absolutely exclude a fracture.
IMPRESSION: 1. Limited examination demonstrating no visible fracture or
effusion.
2. Mild degenerative changes.

## 2020-02-21 IMAGING — CT CT ELBOW*L* W/O CM
3 of 5 series · 14 of 34 positions shown, 16 images · non-contrast
Comparison: None.

CLINICAL DATA: Elbow pain after fall 2 days ago. Patient is on
Xarelto.

EXAM:
CT OF THE UPPER LEFT EXTREMITY WITHOUT CONTRAST
TECHNIQUE: Multidetector CT imaging of the upper left extremity was performed
according to the standard protocol.

[Series 3: axial bone · axial · 0.26mm/px · z∈[+202,+340]mm · 5 of 139 slices shown, 7 images]
[im 24/139  soft-tissue]
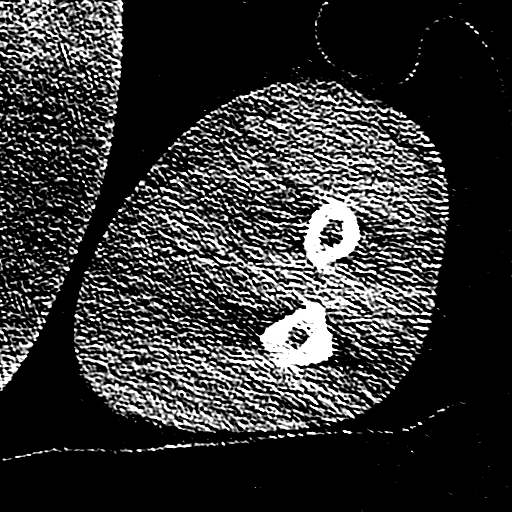
[im 24/139  bone]
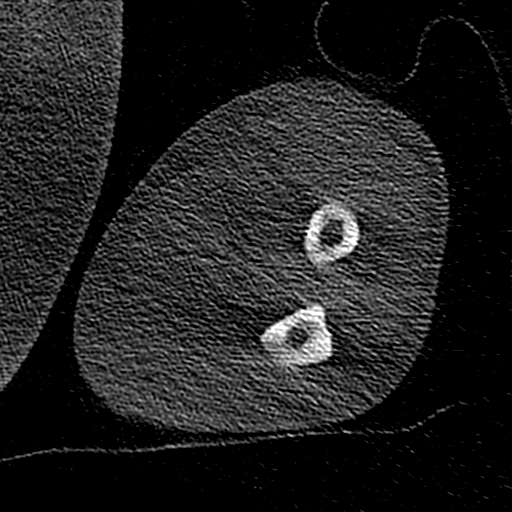
[im 47/139  bone]
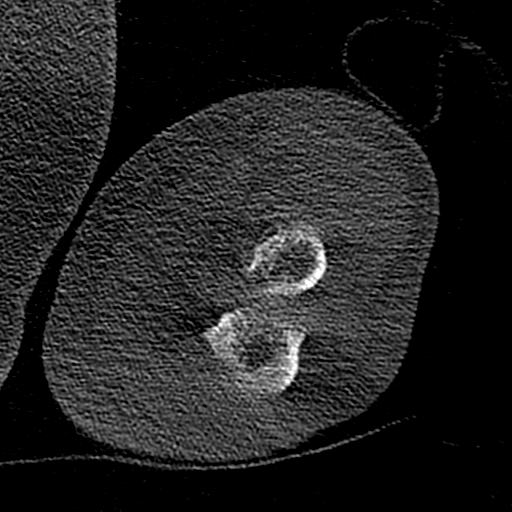
[im 70/139  bone]
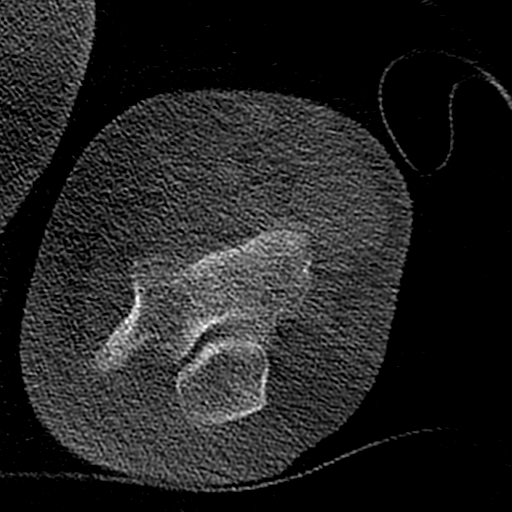
[im 93/139  bone]
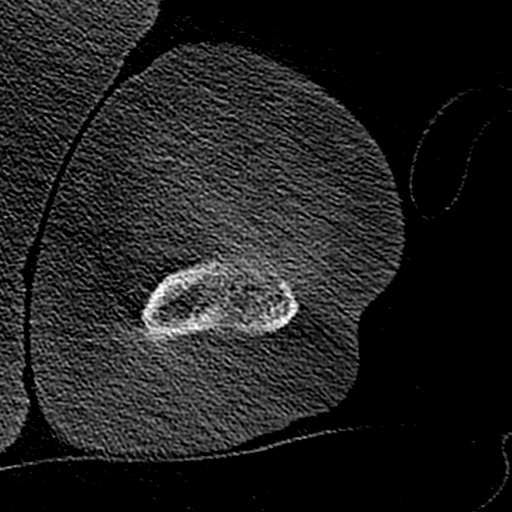
[im 116/139  soft-tissue]
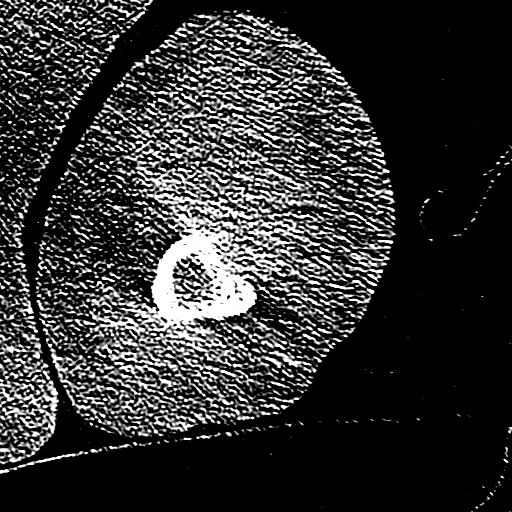
[im 116/139  bone]
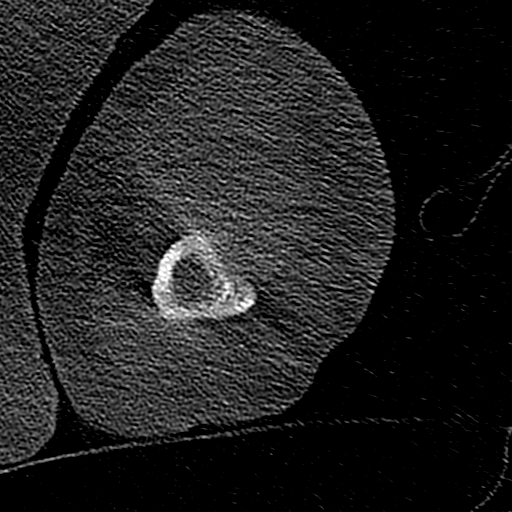

[Series 8: cor st · coronal · 0.34mm/px · 3 of 112 slices shown]
[im 23/112  bone]
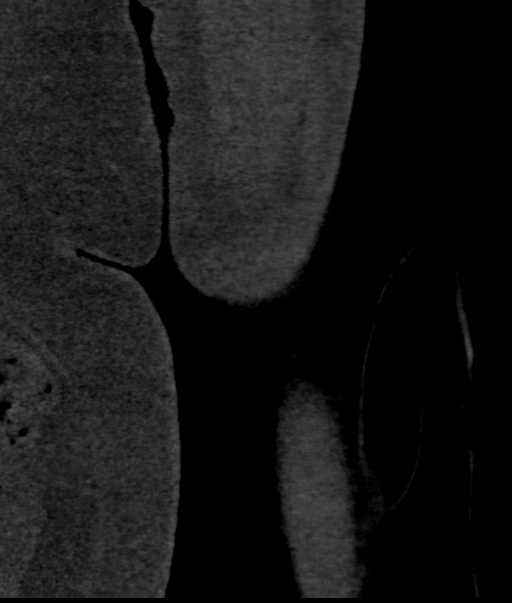
[im 45/112  bone]
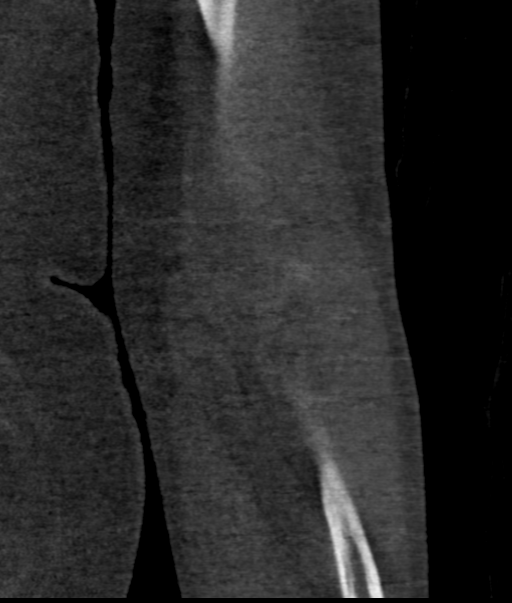
[im 67/112  bone]
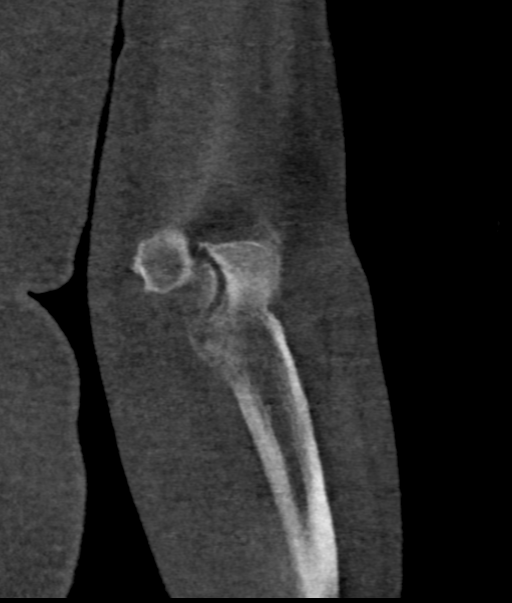

[Series 9: sag st · sagittal · 0.32mm/px · 6 of 131 slices shown]
[im 22/131  bone]
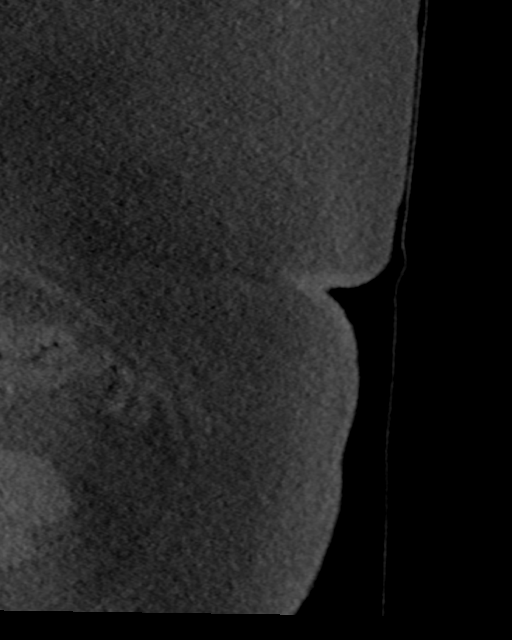
[im 44/131  bone]
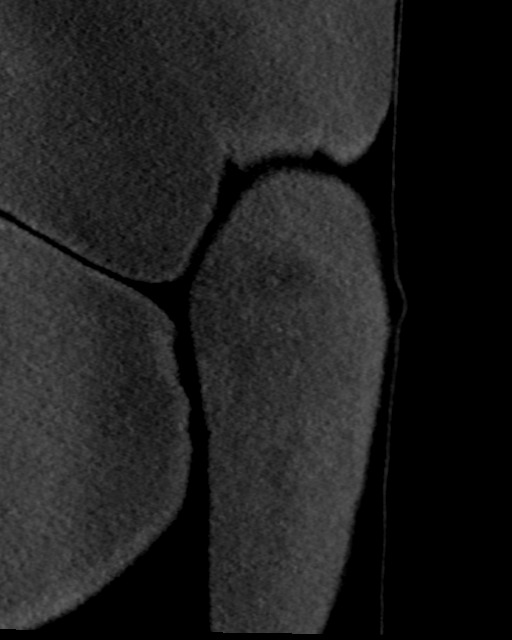
[im 51/131  soft-tissue]
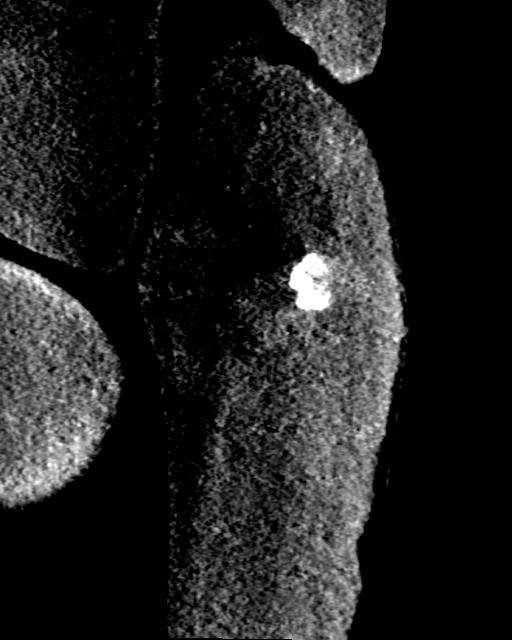
[im 66/131  bone]
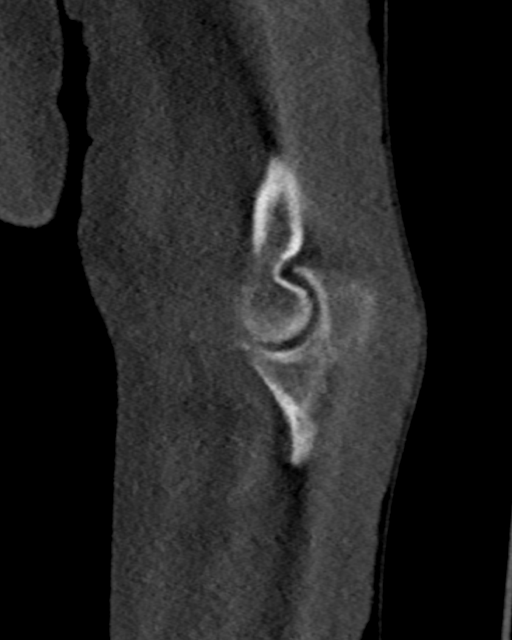
[im 87/131  bone]
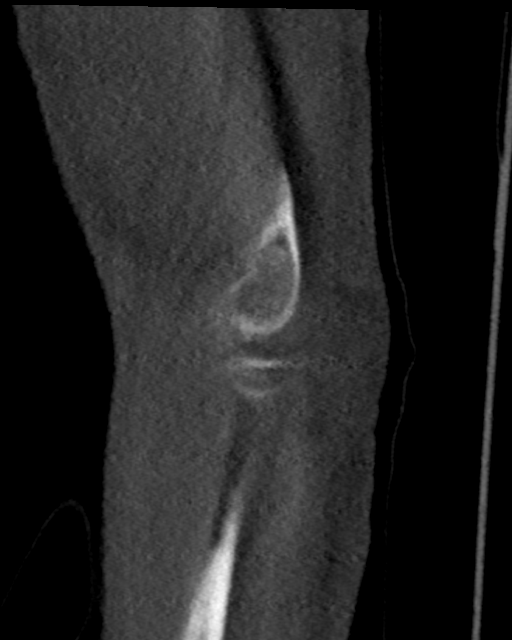
[im 109/131  bone]
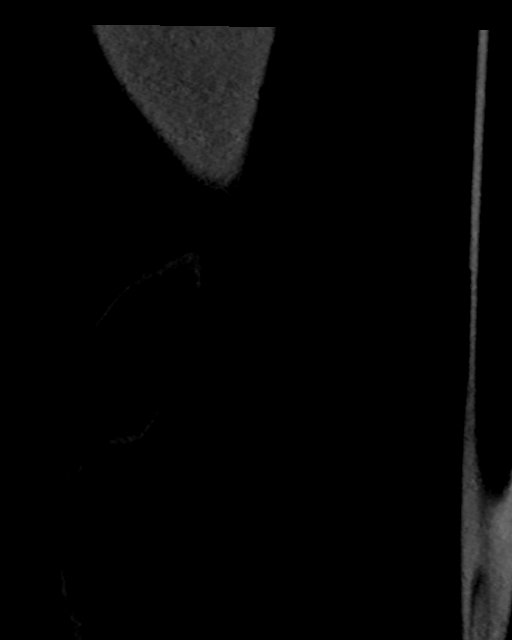

[14 of 34 positions shown; findings below may reference images not displayed]

FINDINGS: The patient was unable to position adequately and had to be imaged
with the arm down by the patient's side. This limits fine bony
detail.

Bones/Joint/Cartilage

Osteoarthritic joint space narrowing of the elbow with spurring off
the coronoid process of the ulna. No definite joint effusion or
fracture. There is a linear lucency with sclerotic appearing margins
traversing the olecranon, nonspecific possibly a vascular channel or
stigmata of old remote trauma. No joint dislocation. Stigmata of
bilateral epicondylitis with ossifications off the epicondyles
bilaterally.

Ligaments

Suboptimally assessed by CT.

Muscles and Tendons

No intramuscular hematoma or hemorrhage. No apparent biceps tendon
tear with retraction.

Soft tissues

Soft tissue contusion over the posterior aspect of the elbow.
IMPRESSION: Fine bony detail is limited due to suboptimal positioning of the
patient's elbow due to the patient's inability to raise his arm
above his head for unimpeded imaging. Attenuation from the patient's
body limits bony detail.

Soft tissue contusion over the posterior aspect of the elbow joint.
No acute displaced fracture or joint dislocation.

Lucency through the olecranon process with sclerotic appearing
margins may reflect a cortical groove, vascular channel or stigmata
of old remote trauma. Lack of joint effusion would suggest that this
is not an acute fracture. Should symptoms persist, consider
alternative imaging such as MRI.

## 2020-02-21 IMAGING — DX DG SHOULDER 2+V*L*
3 series · 3 of 3 positions shown · non-contrast
Comparison: 06/22/2013.

CLINICAL DATA: Left shoulder pain following a fall 2 days ago.

EXAM:
LEFT SHOULDER - 2+ VIEW

[shoulder grashey]
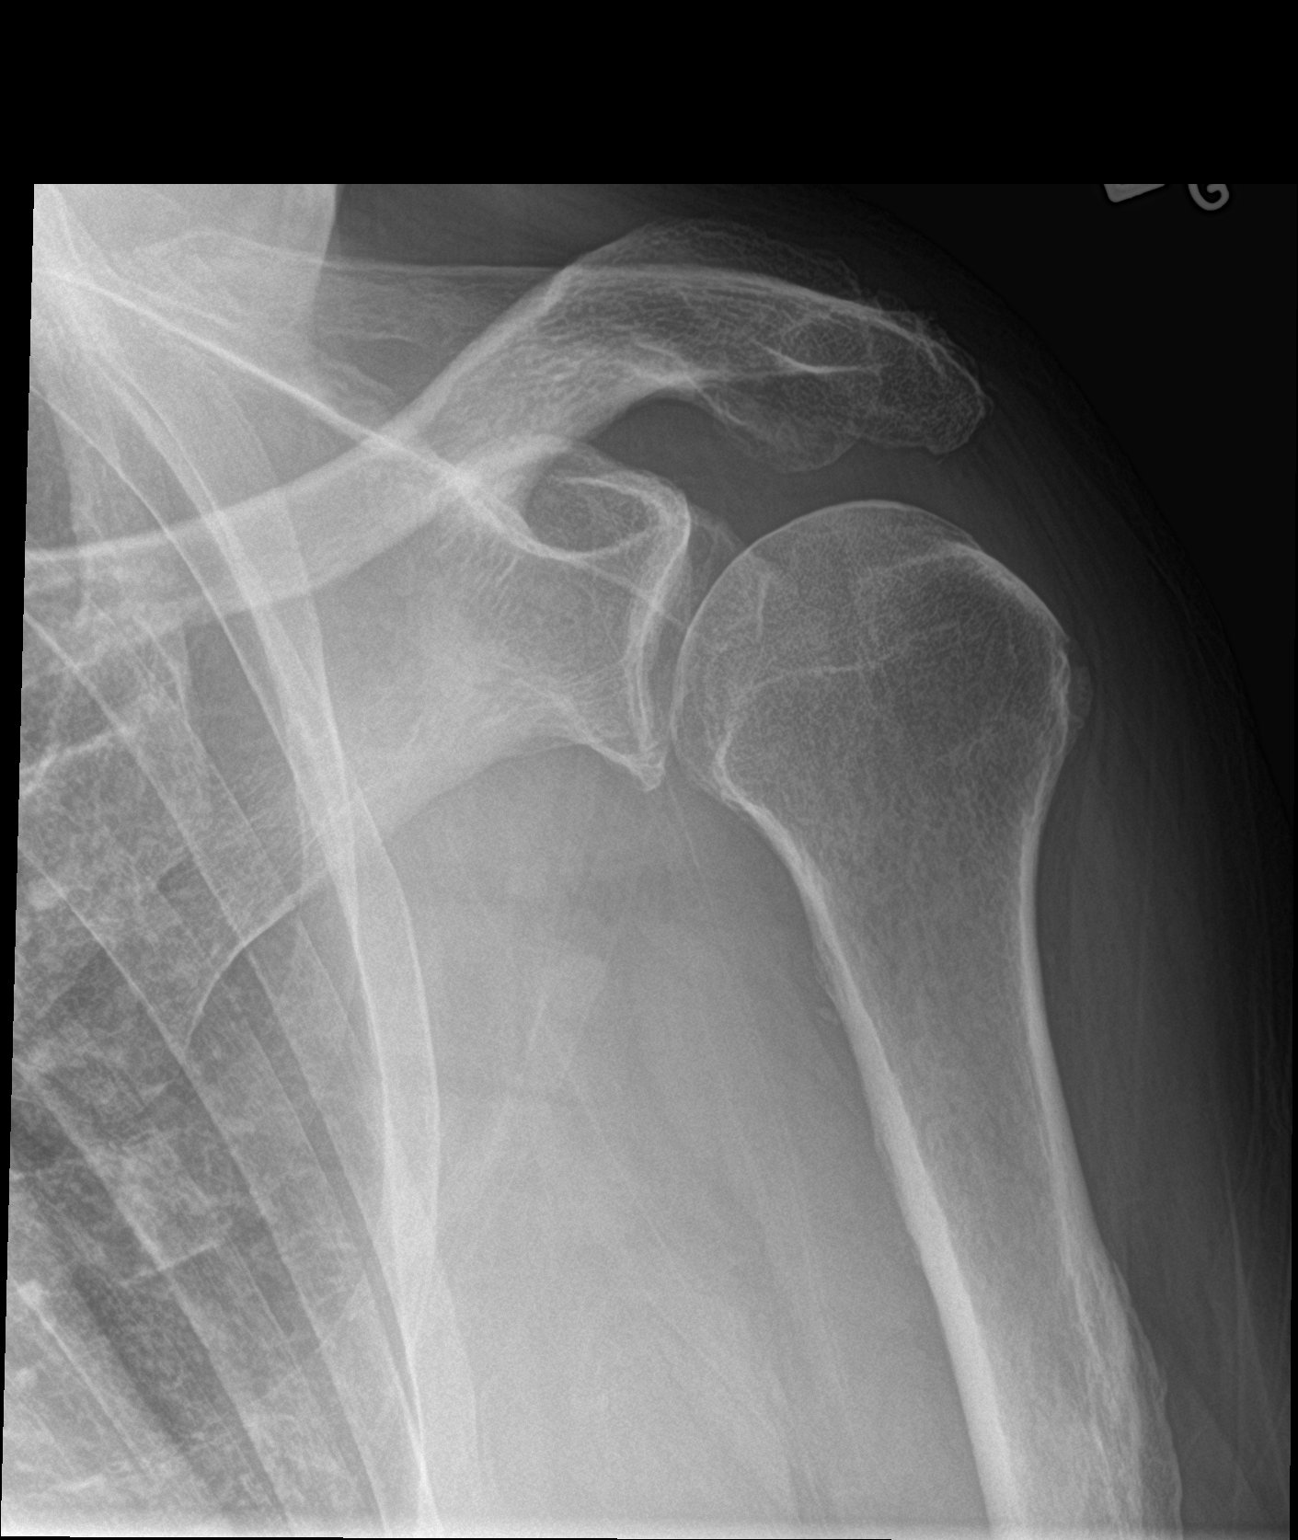

[shoulder y view]
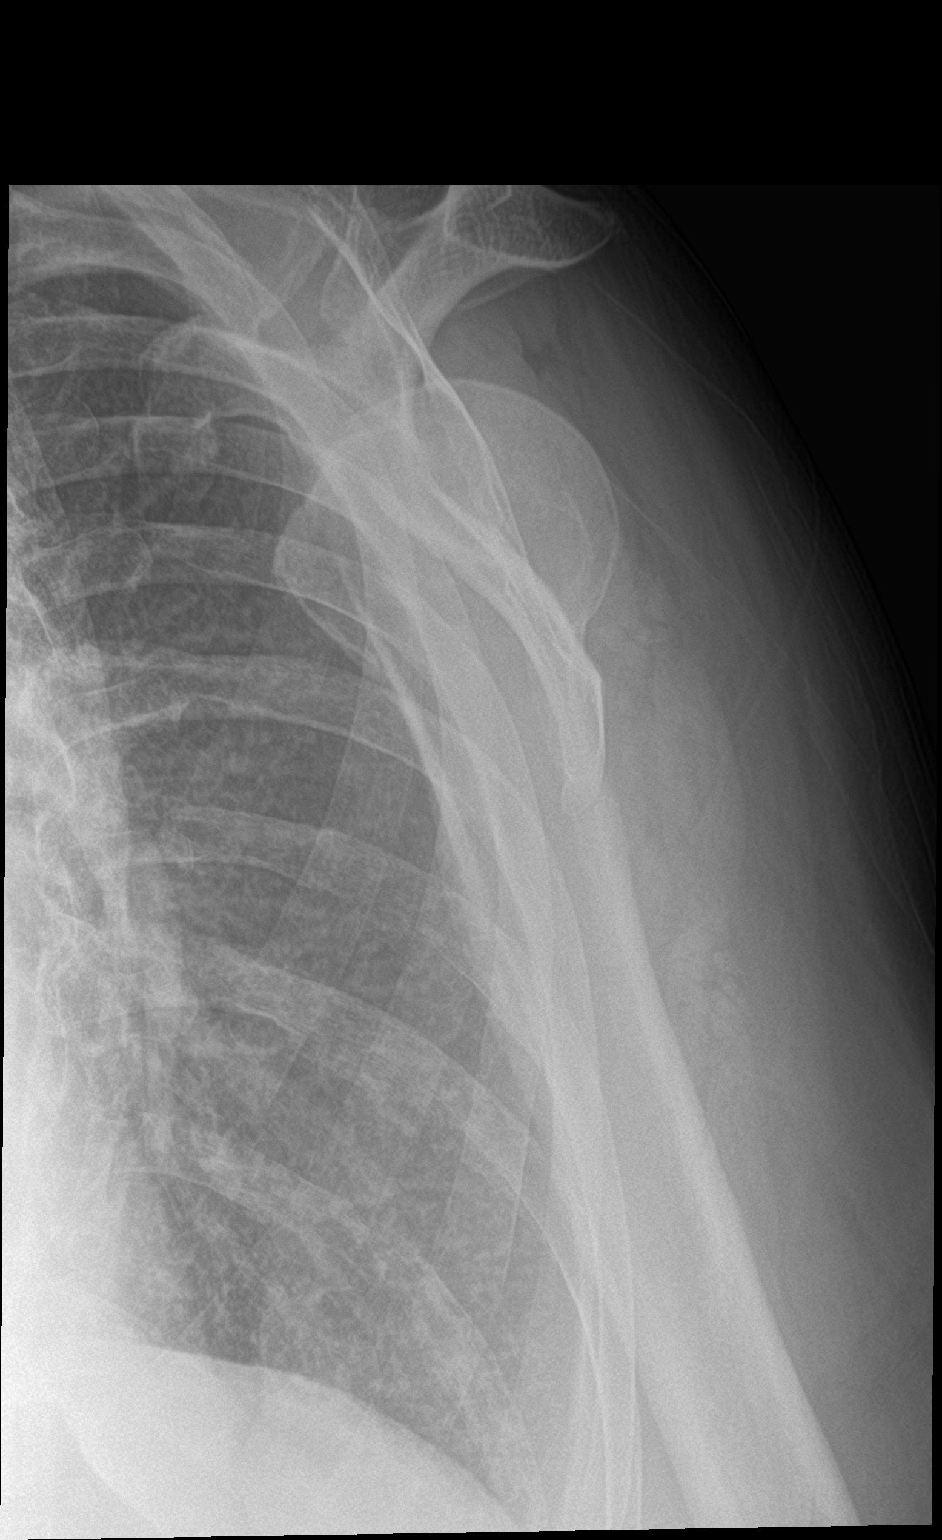

[shoulder axillary]
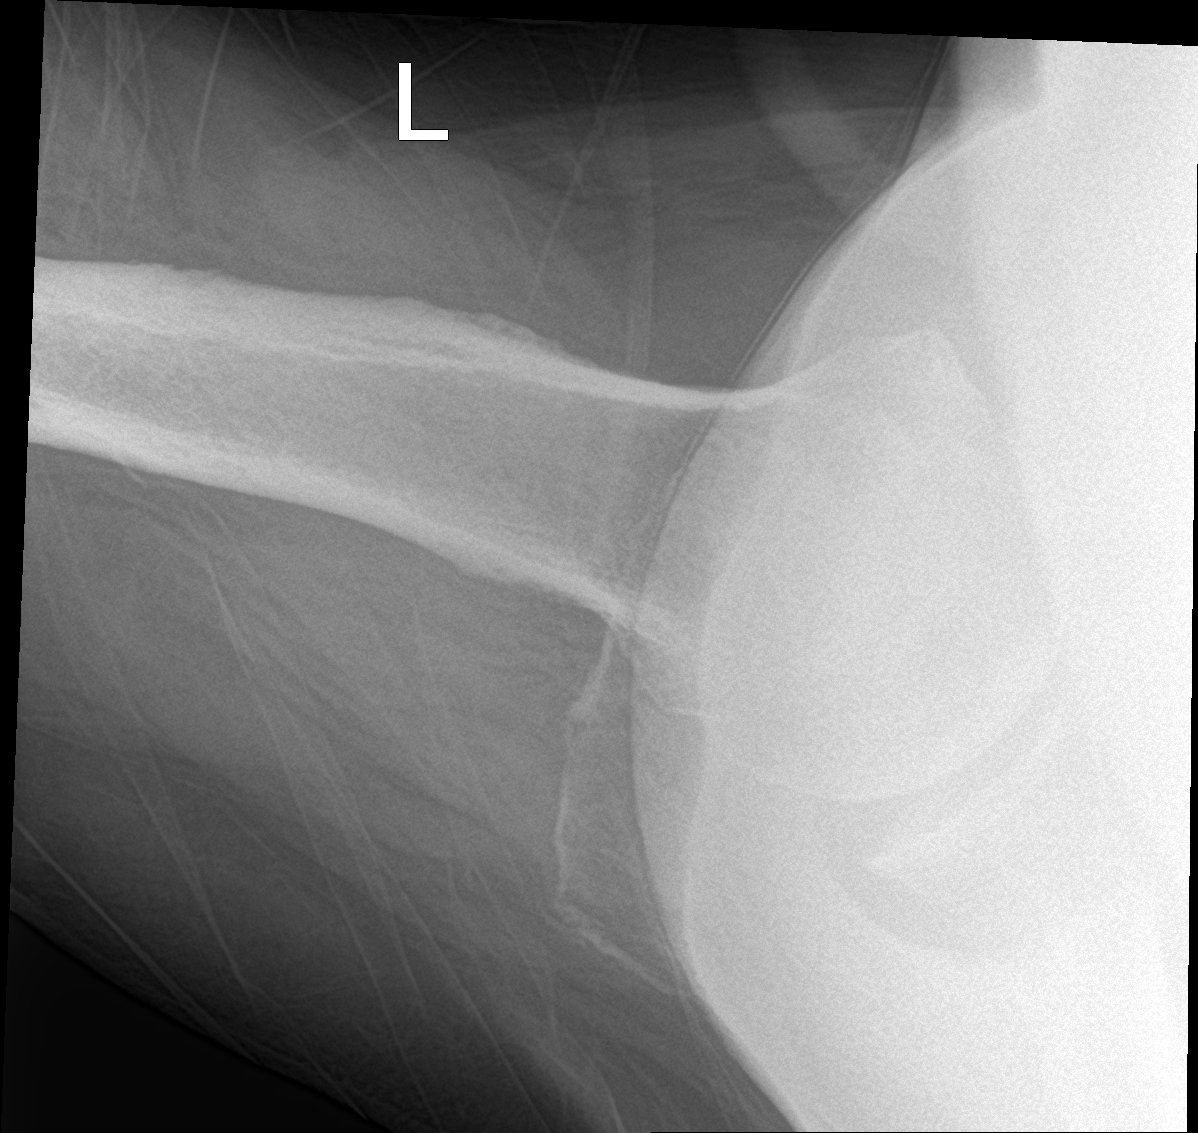

[3 of 3 positions shown; findings below may reference images not displayed]

FINDINGS: Mild inferior glenohumeral spur formation. No fracture or
dislocation.
IMPRESSION: No fracture or dislocation.  Mild degenerative changes.

## 2020-02-21 NOTE — Telephone Encounter (Signed)
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 They have a positive Covid-19 case in his facility and they will be testing everyone tomorrow.

## 2020-02-22 DIAGNOSIS — S32000D Wedge compression fracture of unspecified lumbar vertebra, subsequent encounter for fracture with routine healing: Secondary | ICD-10-CM | POA: Diagnosis not present

## 2020-02-22 DIAGNOSIS — I420 Dilated cardiomyopathy: Secondary | ICD-10-CM | POA: Diagnosis not present

## 2020-02-22 DIAGNOSIS — I5022 Chronic systolic (congestive) heart failure: Secondary | ICD-10-CM | POA: Diagnosis not present

## 2020-02-22 DIAGNOSIS — J449 Chronic obstructive pulmonary disease, unspecified: Secondary | ICD-10-CM | POA: Diagnosis not present

## 2020-02-22 DIAGNOSIS — R262 Difficulty in walking, not elsewhere classified: Secondary | ICD-10-CM | POA: Diagnosis not present

## 2020-02-22 DIAGNOSIS — I110 Hypertensive heart disease with heart failure: Secondary | ICD-10-CM | POA: Diagnosis not present

## 2020-02-23 ENCOUNTER — Telehealth: Payer: Self-pay | Admitting: Internal Medicine

## 2020-02-23 DIAGNOSIS — I420 Dilated cardiomyopathy: Secondary | ICD-10-CM | POA: Diagnosis not present

## 2020-02-23 DIAGNOSIS — R262 Difficulty in walking, not elsewhere classified: Secondary | ICD-10-CM | POA: Diagnosis not present

## 2020-02-23 DIAGNOSIS — S32000D Wedge compression fracture of unspecified lumbar vertebra, subsequent encounter for fracture with routine healing: Secondary | ICD-10-CM | POA: Diagnosis not present

## 2020-02-23 DIAGNOSIS — I110 Hypertensive heart disease with heart failure: Secondary | ICD-10-CM | POA: Diagnosis not present

## 2020-02-23 DIAGNOSIS — I5022 Chronic systolic (congestive) heart failure: Secondary | ICD-10-CM | POA: Diagnosis not present

## 2020-02-23 DIAGNOSIS — J449 Chronic obstructive pulmonary disease, unspecified: Secondary | ICD-10-CM | POA: Diagnosis not present

## 2020-02-23 NOTE — Telephone Encounter (Signed)
Diarrhea over the weekend, incontinence dermatitis  Foam dressing change twice weekly   Please call Marisue Ivan with [facility] @ [phone]

## 2020-02-23 NOTE — Telephone Encounter (Signed)
LVM for Marisue Ivan to call back.

## 2020-02-24 ENCOUNTER — Telehealth: Payer: Medicare Other | Admitting: Internal Medicine

## 2020-02-24 ENCOUNTER — Ambulatory Visit: Payer: Medicare Other | Admitting: Internal Medicine

## 2020-02-24 DIAGNOSIS — I420 Dilated cardiomyopathy: Secondary | ICD-10-CM | POA: Diagnosis not present

## 2020-02-24 DIAGNOSIS — I110 Hypertensive heart disease with heart failure: Secondary | ICD-10-CM | POA: Diagnosis not present

## 2020-02-24 DIAGNOSIS — S32000D Wedge compression fracture of unspecified lumbar vertebra, subsequent encounter for fracture with routine healing: Secondary | ICD-10-CM | POA: Diagnosis not present

## 2020-02-24 DIAGNOSIS — J449 Chronic obstructive pulmonary disease, unspecified: Secondary | ICD-10-CM

## 2020-02-24 DIAGNOSIS — I5022 Chronic systolic (congestive) heart failure: Secondary | ICD-10-CM | POA: Diagnosis not present

## 2020-02-24 DIAGNOSIS — R262 Difficulty in walking, not elsewhere classified: Secondary | ICD-10-CM | POA: Diagnosis not present

## 2020-02-24 NOTE — Patient Instructions (Signed)
none

## 2020-02-24 NOTE — Progress Notes (Signed)
Patient ID: Cody Hale, male   DOB: 03-02-1951, 69 y.o.   MRN: [id number]  Virtual Visit via Video Note  Unable to contact pt by video or phone after multiple tries

## 2020-02-25 ENCOUNTER — Telehealth: Payer: Self-pay

## 2020-02-25 DIAGNOSIS — R262 Difficulty in walking, not elsewhere classified: Secondary | ICD-10-CM | POA: Diagnosis not present

## 2020-02-25 DIAGNOSIS — J449 Chronic obstructive pulmonary disease, unspecified: Secondary | ICD-10-CM | POA: Diagnosis not present

## 2020-02-25 DIAGNOSIS — S32000D Wedge compression fracture of unspecified lumbar vertebra, subsequent encounter for fracture with routine healing: Secondary | ICD-10-CM | POA: Diagnosis not present

## 2020-02-25 DIAGNOSIS — U071 COVID-19: Secondary | ICD-10-CM | POA: Diagnosis not present

## 2020-02-25 DIAGNOSIS — I5022 Chronic systolic (congestive) heart failure: Secondary | ICD-10-CM | POA: Diagnosis not present

## 2020-02-25 DIAGNOSIS — I420 Dilated cardiomyopathy: Secondary | ICD-10-CM | POA: Diagnosis not present

## 2020-02-25 DIAGNOSIS — I110 Hypertensive heart disease with heart failure: Secondary | ICD-10-CM | POA: Diagnosis not present

## 2020-02-25 DIAGNOSIS — Z20828 Contact with and (suspected) exposure to other viral communicable diseases: Secondary | ICD-10-CM | POA: Diagnosis not present

## 2020-02-25 NOTE — Telephone Encounter (Deleted)
Error

## 2020-02-25 NOTE — Telephone Encounter (Signed)
New message    1.Medication Requested:  oxycodone (OXY-IR) 5 MG capsule  2. Pharmacy (Name, Street, City):[facility] - [place], [place] - [address]  3. On Med List: Yes  4. Last Visit with PCP: 8.12.21   5. Next visit date with PCP: 8.23.21    Agent: Please be advised that RX refills may take up to 3 business days. We ask that you follow-up with your pharmacy.

## 2020-02-28 ENCOUNTER — Telehealth (INDEPENDENT_AMBULATORY_CARE_PROVIDER_SITE_OTHER): Payer: Medicare Other | Admitting: Family

## 2020-02-28 DIAGNOSIS — G8929 Other chronic pain: Secondary | ICD-10-CM

## 2020-02-28 DIAGNOSIS — I420 Dilated cardiomyopathy: Secondary | ICD-10-CM | POA: Diagnosis not present

## 2020-02-28 DIAGNOSIS — S32000D Wedge compression fracture of unspecified lumbar vertebra, subsequent encounter for fracture with routine healing: Secondary | ICD-10-CM | POA: Diagnosis not present

## 2020-02-28 DIAGNOSIS — I5022 Chronic systolic (congestive) heart failure: Secondary | ICD-10-CM | POA: Diagnosis not present

## 2020-02-28 DIAGNOSIS — R262 Difficulty in walking, not elsewhere classified: Secondary | ICD-10-CM | POA: Diagnosis not present

## 2020-02-28 DIAGNOSIS — I110 Hypertensive heart disease with heart failure: Secondary | ICD-10-CM | POA: Diagnosis not present

## 2020-02-28 DIAGNOSIS — J449 Chronic obstructive pulmonary disease, unspecified: Secondary | ICD-10-CM | POA: Diagnosis not present
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
## 2020-02-29 DIAGNOSIS — I420 Dilated cardiomyopathy: Secondary | ICD-10-CM | POA: Diagnosis not present

## 2020-02-29 DIAGNOSIS — I5022 Chronic systolic (congestive) heart failure: Secondary | ICD-10-CM | POA: Diagnosis not present

## 2020-02-29 DIAGNOSIS — Z20828 Contact with and (suspected) exposure to other viral communicable diseases: Secondary | ICD-10-CM | POA: Diagnosis not present

## 2020-02-29 DIAGNOSIS — R262 Difficulty in walking, not elsewhere classified: Secondary | ICD-10-CM | POA: Diagnosis not present

## 2020-02-29 DIAGNOSIS — J449 Chronic obstructive pulmonary disease, unspecified: Secondary | ICD-10-CM | POA: Diagnosis not present

## 2020-02-29 DIAGNOSIS — I110 Hypertensive heart disease with heart failure: Secondary | ICD-10-CM | POA: Diagnosis not present

## 2020-02-29 DIAGNOSIS — S32000D Wedge compression fracture of unspecified lumbar vertebra, subsequent encounter for fracture with routine healing: Secondary | ICD-10-CM | POA: Diagnosis not present

## 2020-02-29 DIAGNOSIS — U071 COVID-19: Secondary | ICD-10-CM | POA: Diagnosis not present

## 2020-03-01 ENCOUNTER — Other Ambulatory Visit: Payer: Self-pay

## 2020-03-01 ENCOUNTER — Ambulatory Visit (INDEPENDENT_AMBULATORY_CARE_PROVIDER_SITE_OTHER): Payer: Medicare Other | Admitting: Internal Medicine

## 2020-03-01 ENCOUNTER — Encounter: Payer: Self-pay | Admitting: Internal Medicine

## 2020-03-01 VITALS — BP 132/68 | HR 78 | Temp 98.3°F | Resp 16 | Ht 69.0 in | Wt 244.0 lb

## 2020-03-01 DIAGNOSIS — I110 Hypertensive heart disease with heart failure: Secondary | ICD-10-CM | POA: Diagnosis not present

## 2020-03-01 DIAGNOSIS — E118 Type 2 diabetes mellitus with unspecified complications: Secondary | ICD-10-CM | POA: Diagnosis not present

## 2020-03-01 DIAGNOSIS — D510 Vitamin B12 deficiency anemia due to intrinsic factor deficiency: Secondary | ICD-10-CM | POA: Diagnosis not present

## 2020-03-01 DIAGNOSIS — M170 Bilateral primary osteoarthritis of knee: Secondary | ICD-10-CM

## 2020-03-01 DIAGNOSIS — E785 Hyperlipidemia, unspecified: Secondary | ICD-10-CM | POA: Diagnosis not present

## 2020-03-01 DIAGNOSIS — J301 Allergic rhinitis due to pollen: Secondary | ICD-10-CM

## 2020-03-01 DIAGNOSIS — D500 Iron deficiency anemia secondary to blood loss (chronic): Secondary | ICD-10-CM

## 2020-03-01 DIAGNOSIS — D539 Nutritional anemia, unspecified: Secondary | ICD-10-CM

## 2020-03-01 DIAGNOSIS — U071 COVID-19: Secondary | ICD-10-CM | POA: Diagnosis not present

## 2020-03-01 DIAGNOSIS — Z23 Encounter for immunization: Secondary | ICD-10-CM | POA: Diagnosis not present

## 2020-03-01 DIAGNOSIS — I5022 Chronic systolic (congestive) heart failure: Secondary | ICD-10-CM

## 2020-03-01 DIAGNOSIS — Z20828 Contact with and (suspected) exposure to other viral communicable diseases: Secondary | ICD-10-CM | POA: Diagnosis not present

## 2020-03-01 DIAGNOSIS — I420 Dilated cardiomyopathy: Secondary | ICD-10-CM | POA: Diagnosis not present

## 2020-03-01 DIAGNOSIS — J449 Chronic obstructive pulmonary disease, unspecified: Secondary | ICD-10-CM | POA: Diagnosis not present

## 2020-03-01 DIAGNOSIS — S32000D Wedge compression fracture of unspecified lumbar vertebra, subsequent encounter for fracture with routine healing: Secondary | ICD-10-CM | POA: Diagnosis not present

## 2020-03-01 DIAGNOSIS — R262 Difficulty in walking, not elsewhere classified: Secondary | ICD-10-CM | POA: Diagnosis not present

## 2020-03-01 LAB — POCT GLYCOSYLATED HEMOGLOBIN (HGB A1C): Hemoglobin A1C: 5.6 % (ref 4.0–5.6)

## 2020-03-01 MED ORDER — CYANOCOBALAMIN 1000 MCG/ML IJ SOLN
1000.0000 ug | Freq: Once | INTRAMUSCULAR | Status: AC
  Administered 2020-03-01: 1000 ug via INTRAMUSCULAR

## 2020-03-01 MED ORDER — FLUTICASONE PROPIONATE 50 MCG/ACT NA SUSP: 2.0000 | Freq: Every day | NASAL | 5 refills | Status: DC

## 2020-03-01 MED ORDER — TORSEMIDE 20 MG PO TABS: 20.0000 mg | ORAL_TABLET | Freq: Every day | ORAL | 1 refills | Status: DC

## 2020-03-01 MED ORDER — OXYCODONE HCL 5 MG PO CAPS: 5.0000 mg | ORAL_CAPSULE | Freq: Four times a day (QID) | ORAL | 0 refills | Status: DC | PRN

## 2020-03-01 NOTE — Patient Instructions (Signed)
Edema  Edema is when you have too much fluid in your body or under your skin. Edema may make your legs, feet, and ankles swell up. Swelling is also common in looser tissues, like around your eyes. This is a common condition. It gets more common as you get older. There are many possible causes of edema. Eating too much salt (sodium) and being on your feet or sitting for a long time can cause edema in your legs, feet, and ankles. Hot weather may make edema worse. Edema is usually painless. Your skin may look swollen or shiny. Follow these instructions at home:  Keep the swollen body part raised (elevated) above the level of your heart when you are sitting or lying down.  Do not sit still or stand for a long time.  Do not wear tight clothes. Do not wear garters on your upper legs.  Exercise your legs. This can help the swelling go down.  Wear elastic bandages or support stockings as told by your doctor.  Eat a low-salt (low-sodium) diet to reduce fluid as told by your doctor.  Depending on the cause of your swelling, you may need to limit how much fluid you drink (fluid restriction).  Take over-the-counter and prescription medicines only as told by your doctor. Contact a doctor if:  Treatment is not working.  You have heart, liver, or kidney disease and have symptoms of edema.  You have sudden and unexplained weight gain. Get help right away if:  You have shortness of breath or chest pain.  You cannot breathe when you lie down.  You have pain, redness, or warmth in the swollen areas.  You have heart, liver, or kidney disease and get edema all of a sudden.  You have a fever and your symptoms get worse all of a sudden. Summary  Edema is when you have too much fluid in your body or under your skin.  Edema may make your legs, feet, and ankles swell up. Swelling is also common in looser tissues, like around your eyes.  Raise (elevate) the swollen body part above the level of your  heart when you are sitting or lying down.  Follow your doctor's instructions about diet and how much fluid you can drink (fluid restriction). This information is not intended to replace advice given to you by your health care provider. Make sure you discuss any questions you have with your health care provider. Document Revised: 06/27/2017 Document Reviewed: 07/12/2016 Elsevier Patient Education  2020 [employer]  

## 2020-03-01 NOTE — Progress Notes (Signed)
Subjective:  Patient ID: Cody Hale, male    DOB: July 30, 1950  Age: 69 y.o. MRN: [id number]  CC: Diabetes, Anemia, and Congestive Heart Failure  This visit occurred during the SARS-CoV-2 public health emergency.  Safety protocols were in place, including screening questions prior to the visit, additional usage of staff PPE, and extensive cleaning of exam room while observing appropriate contact time as indicated for disinfecting solutions.    HPI Matan Shon presents for f/up - He has his usual complaints including chronic musculoskeletal pain in his back and large joints.  Left lower extremity weakness.  Lower extremity edema.  Some shortness of breath.  Rare cough but no production of phlegm or blood.  He denies fever, chills, abdominal pain, diarrhea, constipation, melena, bright red blood per rectum.  Outpatient Medications Prior to Visit  Medication Sig Dispense Refill  . acetaminophen (TYLENOL) 325 MG tablet Take 325 mg by mouth every 6 (six) hours as needed.    Marland Kitchen ascorbic acid (VITAMIN C) 500 MG/5ML syrup Place 5 mLs (500 mg total) into feeding tube daily. 473 mL 12  . blood glucose meter kit and supplies KIT Use to check blood sugar 3x per day. DX: E11.8 Accu Check 1 each 0  . carvedilol (COREG) 3.125 MG tablet Place 1 tablet (3.125 mg total) into feeding tube 2 (two) times daily with a meal.    . glucose blood (COOL BLOOD GLUCOSE TEST STRIPS) test strip Use to check blood sugar 3x per day. DX: E11.8 Accu Check 300 each 3  . Insulin Pen Needle (NOVOFINE AUTOCOVER) 30G X 8 MM MISC USE TID. 100 each 1  . ipratropium-albuterol (DUONEB) 0.5-2.5 (3) MG/3ML SOLN Take 3 mLs by nebulization every 2 (two) hours as needed.    . Lancets (ACCU-CHEK SOFT TOUCH) lancets Use to check blood sugar 3x per day. DX: E11.8 Accu Check 300 each 3  . levocetirizine (XYZAL) 5 MG tablet Place 1 tablet (5 mg total) into feeding tube every evening. 90 tablet 1  . losartan (COZAAR) 25 MG tablet Place 0.5  tablets (12.5 mg total) into feeding tube daily.    . metoCLOPramide (REGLAN) 5 MG tablet Take 5 mg by mouth 3 (three) times daily before meals.    . Multiple Vitamin (MULTIVITAMIN WITH MINERALS) TABS tablet Place 1 tablet into feeding tube daily.    . mupirocin ointment (BACTROBAN) 2 % On leg rash bid 30 g 0  . nitroGLYCERIN (NITROSTAT) 0.4 MG SL tablet Place 1 tablet (0.4 mg total) under the tongue every 5 (five) minutes as needed for chest pain. Max 3 doses. 10 tablet 0  . Nutritional Supplements (ENSURE ACTIVE PO) Take 120 mLs by mouth 2 (two) times daily.    . ondansetron (ZOFRAN) 4 MG tablet Take 4 mg by mouth every 8 (eight) hours as needed for nausea or vomiting.    . OXYGEN Inhale 2 L/min into the lungs continuous.    . pantoprazole sodium (PROTONIX) 40 mg/20 mL PACK Place 20 mLs (40 mg total) into feeding tube daily. 30 mL   . rivaroxaban (XARELTO) 20 MG TABS tablet Take one tablet daily via tube. 30 tablet   . rosuvastatin (CRESTOR) 5 MG tablet Take 5 mg by mouth daily.    . tamsulosin (FLOMAX) 0.4 MG CAPS capsule Take 1 capsule (0.4 mg total) by mouth daily after breakfast. 30 capsule   . valACYclovir (VALTREX) 1000 MG tablet Take 1 tablet (1,000 mg total) by mouth 3 (three) times daily. 21 tablet 0  .  Vilazodone HCl (VIIBRYD) 40 MG TABS Take 40 mg by mouth daily.    Marland Kitchen doxycycline (VIBRAMYCIN) 100 MG capsule Take 1 capsule (100 mg total) by mouth 2 (two) times daily. 20 capsule 0  . Dulaglutide (TRULICITY) 1.5 DJ/4.9FW SOPN Inject 1 Act into the skin once a week. 12 pen 1  . fluticasone (FLONASE) 50 MCG/ACT nasal spray Place 2 sprays into both nostrils daily. (Patient taking differently: Place 2 sprays into both nostrils 2 (two) times daily. ) 16 g 5  . Fluticasone-Umeclidin-Vilant (TRELEGY ELLIPTA) 100-62.5-25 MCG/INH AEPB Inhale 1 puff into the lungs daily. 120 each 1  . insulin aspart (NOVOLOG) 100 UNIT/ML injection CBG 121 - 150: 3 units CBG 151 - 200: 4 units CBG 201 - 250: 7  units CBG 251 - 300: 11 units CBG 301 - 350: 15 units CBG 351 - 400: 20 units 10 mL   . Insulin Degludec (TRESIBA FLEXTOUCH) 200 UNIT/ML SOPN Inject 50 Units into the skin daily. 9 mL 1  . Magnesium Oxide 400 (240 Mg) MG TABS Give 1 tablet (400 mg total) by tube 2 (two) times daily. 90 tablet 1  . methocarbamol (ROBAXIN) 500 MG tablet Take 1 tablet (500 mg total) by mouth every 8 (eight) hours as needed for muscle spasms.    Marland Kitchen oxycodone (OXY-IR) 5 MG capsule Take 5 mg by mouth every 4 (four) hours as needed.    . torsemide (DEMADEX) 20 MG tablet Place 1 tablet (20 mg total) into feeding tube daily at 6 PM.     No facility-administered medications prior to visit.    ROS Review of Systems  Constitutional: Positive for fatigue. Negative for appetite change, chills, diaphoresis and fever.  HENT: Positive for congestion. Negative for facial swelling, nosebleeds and postnasal drip.   Eyes: Negative.   Respiratory: Positive for cough and shortness of breath. Negative for chest tightness and wheezing.   Cardiovascular: Positive for leg swelling. Negative for chest pain and palpitations.  Gastrointestinal: Negative for abdominal pain, blood in stool, constipation, diarrhea, nausea and vomiting.  Endocrine: Negative.   Genitourinary: Negative.  Negative for difficulty urinating.  Musculoskeletal: Positive for arthralgias and back pain. Negative for myalgias and neck pain.  Skin: Negative for color change, pallor and rash.  Neurological: Positive for weakness. Negative for dizziness, light-headedness, numbness and headaches.  Hematological: Negative for adenopathy. Does not bruise/bleed easily.  Psychiatric/Behavioral: Negative.     Objective:  BP 132/68 (BP Location: Left Arm, Patient Position: Sitting, Cuff Size: Normal)   Pulse 78   Temp 98.3 F (36.8 C) (Oral)   Resp 16   Ht _0  (1.753 m)   Wt 244 lb (110.7 kg)   SpO2 96%   BMI 36.03 kg/m   BP Readings from Last 3 Encounters:   03/01/20 132/68  02/17/20 (!) 104/40  02/10/20 (!) 144/76    Wt Readings from Last 3 Encounters:  03/01/20 244 lb (110.7 kg)  02/17/20 225 lb (102.1 kg)  02/10/20 240 lb (108.9 kg)    Physical Exam Vitals reviewed.  Constitutional:      General: He is not in acute distress.    Appearance: He is obese. He is ill-appearing (in a roller chair with an aide). He is not toxic-appearing or diaphoretic.  HENT:     Nose: Nose normal.     Mouth/Throat:     Mouth: Mucous membranes are moist.  Eyes:     General: No scleral icterus.    Conjunctiva/sclera: Conjunctivae normal.  Cardiovascular:  Rate and Rhythm: Normal rate and regular rhythm.     Heart sounds: Murmur heard.   Pulmonary:     Effort: Pulmonary effort is normal.     Breath sounds: No stridor. Rhonchi present. No wheezing or rales.  Chest:     Chest wall: No tenderness.  Abdominal:     General: Abdomen is protuberant. Bowel sounds are normal. There is no distension.     Palpations: Abdomen is soft. There is no hepatomegaly, splenomegaly or mass.     Tenderness: There is no abdominal tenderness.  Musculoskeletal:     Cervical back: Neck supple.     Right lower leg: 3+ Edema present.     Left lower leg: 3+ Edema present.  Lymphadenopathy:     Cervical: No cervical adenopathy.  Skin:    General: Skin is warm and dry.     Coloration: Skin is pale.  Neurological:     Mental Status: He is alert and oriented to person, place, and time. Mental status is at baseline.     Motor: Weakness present.     Gait: Gait abnormal.  Psychiatric:        Mood and Affect: Mood normal.        Behavior: Behavior normal.     Lab Results  Component Value Date   WBC 5.8 03/01/2020   HGB 11.8 (L) 03/01/2020   HCT 36.4 (L) 03/01/2020   PLT 177 03/01/2020   GLUCOSE 116 (H) 03/01/2020   CHOL 77 03/01/2020   TRIG 167 (H) 03/01/2020   HDL 32 (L) 03/01/2020   LDLDIRECT 41.1 05/20/2019   LDLCALC 20 03/01/2020   ALT 19 05/19/2019    AST 41 05/19/2019   NA 141 03/01/2020   K 5.2 03/01/2020   CL 101 03/01/2020   CREATININE 1.07 03/01/2020   BUN 21 03/01/2020   CO2 36 (H) 03/01/2020   TSH 1.14 03/01/2020   PSA 0.36 05/30/2014   INR 1.2 05/06/2019   HGBA1C 5.6 03/01/2020   MICROALBUR 1.7 07/22/2018    CT Head Wo Contrast  Result Date: 02/10/2020 CLINICAL DATA:  Head trauma, minor. Additional provided: Fall in shower today, hitting head. EXAM: CT HEAD WITHOUT CONTRAST TECHNIQUE: Contiguous axial images were obtained from the base of the skull through the vertex without intravenous contrast. COMPARISON:  Prior head CT examinations 05/19/2018 and earlier FINDINGS: Brain: Stable, mild generalized parenchymal atrophy. Redemonstrated 5 mm rounded hyperdense focus within the anterior aspect of the third ventricle consistent with colloid cyst. There is no associated hydrocephalus. There is no acute intracranial hemorrhage. No demarcated cortical infarct. No extra-axial fluid collection. No midline shift. Vascular: No hyperdense vessel.  Atherosclerotic calcifications. Skull: No calvarial fracture. Sinuses/Orbits: Visualized orbits show no acute finding. Mild ethmoid sinus mucosal thickening. No significant mastoid effusion. IMPRESSION: No evidence of acute intracranial abnormality. 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 No associated hydrocephalus. Stable, mild generalized parenchymal atrophy. Mild ethmoid sinus mucosal thickening. Electronically Signed   By: Kellie Simmering DO   On: 02/10/2020 17:26   DG Foot Complete Right  Result Date: 02/10/2020 CLINICAL DATA:  Pain status post fall EXAM: RIGHT FOOT COMPLETE - 3+ VIEW COMPARISON:  None. FINDINGS: There is heterogeneous osteopenia. There are degenerative changes of the interphalangeal joints as well as the first metatarsophalangeal joint. There is no acute displaced fracture or dislocation. There are degenerative changes of the midfoot. There is a large plantar  calcaneal spur. There is an Achilles tendon enthesophyte. There are  degenerative changes of the ankle mortise. IMPRESSION: 1. No acute displaced fracture or dislocation. 2. Osteoarthritis as above. 3. Osteopenia. 4. Large plantar calcaneal spur. Electronically Signed   By: Constance Holster M.D.   On: 02/10/2020 17:15   DG Toe Great Left  Result Date: 02/10/2020 CLINICAL DATA:  Pain EXAM: LEFT GREAT TOE COMPARISON:  None. FINDINGS: There is an acute, nondisplaced, intra-articular fracture through the base of the distal phalanx of the first digit. There is surrounding soft tissue swelling. There is osteopenia. There are degenerative changes at the first metatarsophalangeal joint. IMPRESSION: Acute nondisplaced intra-articular fracture through the base of the distal phalanx of the first digit. Electronically Signed   By: Constance Holster M.D.   On: 02/10/2020 17:14    Assessment & Plan:   Adlai was seen today for diabetes, anemia and congestive heart failure.  Diagnoses and all orders for this visit:  Type II diabetes mellitus with manifestations ([place])- His A1c is down to 5.6%.  Medical therapy is not indicated. -     POCT glycosylated hemoglobin (Hb A1C) -     BASIC METABOLIC PANEL WITH GFR; Future -     Urinalysis, Routine w reflex microscopic; Future -     Microalbumin / creatinine urine ratio; Future -     BASIC METABOLIC PANEL WITH GFR  Allergic rhinitis due to pollen -     fluticasone (FLONASE) 50 MCG/ACT nasal spray; Place 2 sprays into both nostrils daily.  Primary osteoarthritis of both knees -     oxycodone (OXY-IR) 5 MG capsule; Take 1 capsule (5 mg total) by mouth every 6 (six) hours as needed.  Chronic systolic CHF (congestive heart failure) ([place])- He has signs of fluid overload.  I recommended that he start taking a loop diuretic. -     torsemide (DEMADEX) 20 MG tablet; Place 1 tablet (20 mg total) into feeding tube daily at 6 PM.  Deficiency anemia- He remains anemic.   Will continue parenteral B12 replacement therapy.  His iron level is low.  His other vitamin levels are normal. -     CBC with Differential/Platelet; Future -     Iron; Future -     Ferritin; Future -     Vitamin B1; Future -     Vitamin B1 -     Ferritin -     Iron -     CBC with Differential/Platelet  Hyperlipidemia with target LDL less than 70- He has achieved his LDL goal and is doing well on the statin. -     Lipid panel; Future -     TSH; Future -     Hepatic function panel; Future -     Hepatic function panel -     TSH -     Lipid panel  Vitamin B12 deficiency anemia due to intrinsic factor deficiency -     CBC with Differential/Platelet; Future -     Folate; Future -     Folate -     CBC with Differential/Platelet -     cyanocobalamin ((VITAMIN B-12)) injection 1,000 mcg  Need for Tdap vaccination -     Tdap vaccine greater than or equal to 7yo IM  Need for influenza vaccination -     Flu Vaccine QUAD 36+ mos IM  Iron deficiency anemia due to chronic blood loss- I recommended that he see GI to undergo upper and lower endoscopy to screen for sources of blood loss.  Will treat this with oral iron  and I have also recommended that he receive an iron infusion. -     ferrous sulfate 325 (65 FE) MG tablet; Take 1 tablet (325 mg total) by mouth 2 (two) times daily with a meal. -     Ambulatory referral to Gastroenterology   I have discontinued Burlie Egelston "David"'s Trulicity, Trelegy Ellipta, Tresiba FlexTouch, insulin aspart, Magnesium Oxide, methocarbamol, and doxycycline. I have also changed his oxycodone. Additionally, I am having him start on ferrous sulfate. Lastly, I am having him maintain his NovoFine Autocover, blood glucose meter kit and supplies, Cool Blood Glucose Test Strips, accu-chek soft touch, ascorbic acid, carvedilol, levocetirizine, multivitamin with minerals, nitroGLYCERIN, pantoprazole sodium, tamsulosin, losartan, rivaroxaban, Vilazodone HCl, rosuvastatin,  Nutritional Supplements (ENSURE ACTIVE PO), OXYGEN, metoCLOPramide, acetaminophen, ipratropium-albuterol, ondansetron, valACYclovir, mupirocin ointment, fluticasone, and torsemide. We administered cyanocobalamin.  Meds ordered this encounter  Medications  . fluticasone (FLONASE) 50 MCG/ACT nasal spray    Sig: Place 2 sprays into both nostrils daily.    Dispense:  16 g    Refill:  5  . torsemide (DEMADEX) 20 MG tablet    Sig: Place 1 tablet (20 mg total) into feeding tube daily at 6 PM.    Dispense:  90 tablet    Refill:  1  . oxycodone (OXY-IR) 5 MG capsule    Sig: Take 1 capsule (5 mg total) by mouth every 6 (six) hours as needed.    Dispense:  45 capsule    Refill:  0  . cyanocobalamin ((VITAMIN B-12)) injection 1,000 mcg  . ferrous sulfate 325 (65 FE) MG tablet    Sig: Take 1 tablet (325 mg total) by mouth 2 (two) times daily with a meal.    Dispense:  180 tablet    Refill:  1   I spent 50 minutes in preparing to see the patient by review of recent labs, imaging and procedures, obtaining and reviewing separately obtained history, communicating with the patient and family or caregiver, ordering medications, tests or procedures, and documenting clinical information in the EHR including the differential Dx, treatment, and any further evaluation and other management of 1. Type II diabetes mellitus with manifestations ([place]) 2. Allergic rhinitis due to pollen 3. Primary osteoarthritis of both knees 4. Chronic systolic CHF (congestive heart failure) ([place]) 5. Deficiency anemia 6. Hyperlipidemia with target LDL less than 70 7. 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 Iron deficiency anemia due to chronic blood loss     Follow-up: Return in about 3 months (around 06/01/2020).  Scarlette Calico, MD

## 2020-03-02 DIAGNOSIS — I420 Dilated cardiomyopathy: Secondary | ICD-10-CM | POA: Diagnosis not present

## 2020-03-02 DIAGNOSIS — R262 Difficulty in walking, not elsewhere classified: Secondary | ICD-10-CM | POA: Diagnosis not present

## 2020-03-02 DIAGNOSIS — D500 Iron deficiency anemia secondary to blood loss (chronic): Secondary | ICD-10-CM | POA: Insufficient documentation

## 2020-03-02 DIAGNOSIS — S32000D Wedge compression fracture of unspecified lumbar vertebra, subsequent encounter for fracture with routine healing: Secondary | ICD-10-CM | POA: Diagnosis not present

## 2020-03-02 DIAGNOSIS — Z20828 Contact with and (suspected) exposure to other viral communicable diseases: Secondary | ICD-10-CM | POA: Diagnosis not present

## 2020-03-02 DIAGNOSIS — U071 COVID-19: Secondary | ICD-10-CM | POA: Diagnosis not present

## 2020-03-02 DIAGNOSIS — J449 Chronic obstructive pulmonary disease, unspecified: Secondary | ICD-10-CM | POA: Diagnosis not present

## 2020-03-02 DIAGNOSIS — Z23 Encounter for immunization: Secondary | ICD-10-CM | POA: Insufficient documentation

## 2020-03-02 DIAGNOSIS — I5022 Chronic systolic (congestive) heart failure: Secondary | ICD-10-CM | POA: Diagnosis not present

## 2020-03-02 DIAGNOSIS — I110 Hypertensive heart disease with heart failure: Secondary | ICD-10-CM | POA: Diagnosis not present

## 2020-03-02 LAB — CBC WITH DIFFERENTIAL/PLATELET
Absolute Monocytes: 586 cells/uL (ref 200–950)
Basophils Absolute: 41 cells/uL (ref 0–200)
Basophils Relative: 0.7 %
Eosinophils Absolute: 133 cells/uL (ref 15–500)
Eosinophils Relative: 2.3 %
HCT: 36.4 % — ABNORMAL LOW (ref 38.5–50.0)
Hemoglobin: 11.8 g/dL — ABNORMAL LOW (ref 13.2–17.1)
Lymphs Abs: 667 cells/uL — ABNORMAL LOW (ref 850–3900)
MCH: 29.8 pg (ref 27.0–33.0)
MCHC: 32.4 g/dL (ref 32.0–36.0)
MCV: 91.9 fL (ref 80.0–100.0)
MPV: 13.4 fL — ABNORMAL HIGH (ref 7.5–12.5)
Monocytes Relative: 10.1 %
Neutro Abs: 4373 cells/uL (ref 1500–7800)
Neutrophils Relative %: 75.4 %
Platelets: 177 10*3/uL (ref 140–400)
RBC: 3.96 10*6/uL — ABNORMAL LOW (ref 4.20–5.80)
RDW: 14.8 % (ref 11.0–15.0)
Total Lymphocyte: 11.5 %
WBC: 5.8 10*3/uL (ref 3.8–10.8)

## 2020-03-02 LAB — BASIC METABOLIC PANEL WITH GFR
BUN: 21 mg/dL (ref 7–25)
CO2: 36 mmol/L — ABNORMAL HIGH (ref 20–32)
Calcium: 8.9 mg/dL (ref 8.6–10.3)
Chloride: 101 mmol/L (ref 98–110)
Creat: 1.07 mg/dL (ref 0.70–1.25)
GFR, Est African American: 82 mL/min/{1.73_m2} (ref >=60)
GFR, Est Non African American: 70 mL/min/{1.73_m2} (ref >=60)
Glucose, Bld: 116 mg/dL — ABNORMAL HIGH (ref 65–99)
Potassium: 5.2 mmol/L (ref 3.5–5.3)
Sodium: 141 mmol/L (ref 135–146)

## 2020-03-02 LAB — LIPID PANEL
Cholesterol: 77 mg/dL (ref <200)
HDL: 32 mg/dL — ABNORMAL LOW (ref >=40)
LDL Cholesterol (Calc): 20 mg/dL (calc)
Non-HDL Cholesterol (Calc): 45 mg/dL (calc) (ref <130)
Total CHOL/HDL Ratio: 2.4 (calc) (ref <5.0)
Triglycerides: 167 mg/dL — ABNORMAL HIGH (ref <150)

## 2020-03-02 LAB — TSH: TSH: 1.14 mIU/L (ref 0.40–4.50)

## 2020-03-02 LAB — IRON: Iron: 26 ug/dL — ABNORMAL LOW (ref 50–180)

## 2020-03-02 MED ORDER — FERROUS SULFATE 325 (65 FE) MG PO TABS: 325.0000 mg | ORAL_TABLET | Freq: Two times a day (BID) | ORAL | 1 refills | Status: AC

## 2020-03-03 DIAGNOSIS — M79675 Pain in left toe(s): Secondary | ICD-10-CM | POA: Diagnosis not present

## 2020-03-03 DIAGNOSIS — B351 Tinea unguium: Secondary | ICD-10-CM | POA: Diagnosis not present

## 2020-03-03 DIAGNOSIS — M79674 Pain in right toe(s): Secondary | ICD-10-CM | POA: Diagnosis not present

## 2020-03-04 LAB — HEPATIC FUNCTION PANEL
AG Ratio: 1.4 (calc) (ref 1.0–2.5)
ALT: 15 U/L (ref 9–46)
AST: 19 U/L (ref 10–35)
Albumin: 3.4 g/dL — ABNORMAL LOW (ref 3.6–5.1)
Alkaline phosphatase (APISO): 113 U/L (ref 35–144)
Bilirubin, Direct: 0.1 mg/dL (ref 0.0–0.2)
Globulin: 2.5 g/dL (calc) (ref 1.9–3.7)
Indirect Bilirubin: 0.4 mg/dL (calc) (ref 0.2–1.2)
Total Bilirubin: 0.5 mg/dL (ref 0.2–1.2)
Total Protein: 5.9 g/dL — ABNORMAL LOW (ref 6.1–8.1)

## 2020-03-04 LAB — VITAMIN B1: Vitamin B1 (Thiamine): 31 nmol/L — ABNORMAL HIGH (ref 8–30)

## 2020-03-04 LAB — FERRITIN: Ferritin: 98 ng/mL (ref 24–380)

## 2020-03-04 LAB — FOLATE: Folate: 16.9 ng/mL

## 2020-03-06 ENCOUNTER — Telehealth: Payer: Self-pay | Admitting: Internal Medicine

## 2020-03-06 DIAGNOSIS — S32000D Wedge compression fracture of unspecified lumbar vertebra, subsequent encounter for fracture with routine healing: Secondary | ICD-10-CM | POA: Diagnosis not present

## 2020-03-06 DIAGNOSIS — I5022 Chronic systolic (congestive) heart failure: Secondary | ICD-10-CM | POA: Diagnosis not present

## 2020-03-06 DIAGNOSIS — I420 Dilated cardiomyopathy: Secondary | ICD-10-CM | POA: Diagnosis not present

## 2020-03-06 DIAGNOSIS — J449 Chronic obstructive pulmonary disease, unspecified: Secondary | ICD-10-CM | POA: Diagnosis not present

## 2020-03-06 DIAGNOSIS — I110 Hypertensive heart disease with heart failure: Secondary | ICD-10-CM | POA: Diagnosis not present

## 2020-03-06 DIAGNOSIS — R262 Difficulty in walking, not elsewhere classified: Secondary | ICD-10-CM | POA: Diagnosis not present

## 2020-03-06 NOTE — Telephone Encounter (Signed)
[facility] [phone]  Skin tear left fore arm  Need an order for xeroform gauze covered by a dry dressing changed twice weekly  Both legs are really swollen, shiny, skin taunt, patient states had bloodwork, anything being done about the swelling

## 2020-03-07 DIAGNOSIS — U071 COVID-19: Secondary | ICD-10-CM | POA: Diagnosis not present

## 2020-03-07 DIAGNOSIS — Z20828 Contact with and (suspected) exposure to other viral communicable diseases: Secondary | ICD-10-CM | POA: Diagnosis not present

## 2020-03-07 DIAGNOSIS — R262 Difficulty in walking, not elsewhere classified: Secondary | ICD-10-CM | POA: Diagnosis not present

## 2020-03-07 DIAGNOSIS — I5022 Chronic systolic (congestive) heart failure: Secondary | ICD-10-CM | POA: Diagnosis not present

## 2020-03-07 DIAGNOSIS — S32000D Wedge compression fracture of unspecified lumbar vertebra, subsequent encounter for fracture with routine healing: Secondary | ICD-10-CM | POA: Diagnosis not present

## 2020-03-07 DIAGNOSIS — I420 Dilated cardiomyopathy: Secondary | ICD-10-CM | POA: Diagnosis not present

## 2020-03-07 DIAGNOSIS — J449 Chronic obstructive pulmonary disease, unspecified: Secondary | ICD-10-CM | POA: Diagnosis not present

## 2020-03-07 DIAGNOSIS — I110 Hypertensive heart disease with heart failure: Secondary | ICD-10-CM | POA: Diagnosis not present

## 2020-03-07 NOTE — Telephone Encounter (Signed)
Called Marisue Ivan, LVM giving the ok to proceed.

## 2020-03-08 DIAGNOSIS — I5022 Chronic systolic (congestive) heart failure: Secondary | ICD-10-CM | POA: Diagnosis not present

## 2020-03-08 DIAGNOSIS — R262 Difficulty in walking, not elsewhere classified: Secondary | ICD-10-CM | POA: Diagnosis not present

## 2020-03-08 DIAGNOSIS — I110 Hypertensive heart disease with heart failure: Secondary | ICD-10-CM | POA: Diagnosis not present

## 2020-03-08 DIAGNOSIS — I420 Dilated cardiomyopathy: Secondary | ICD-10-CM | POA: Diagnosis not present

## 2020-03-08 DIAGNOSIS — J449 Chronic obstructive pulmonary disease, unspecified: Secondary | ICD-10-CM | POA: Diagnosis not present

## 2020-03-08 DIAGNOSIS — S32000D Wedge compression fracture of unspecified lumbar vertebra, subsequent encounter for fracture with routine healing: Secondary | ICD-10-CM | POA: Diagnosis not present

## 2020-03-09 DIAGNOSIS — I110 Hypertensive heart disease with heart failure: Secondary | ICD-10-CM | POA: Diagnosis not present

## 2020-03-09 DIAGNOSIS — J449 Chronic obstructive pulmonary disease, unspecified: Secondary | ICD-10-CM | POA: Diagnosis not present

## 2020-03-09 DIAGNOSIS — U071 COVID-19: Secondary | ICD-10-CM | POA: Diagnosis not present

## 2020-03-09 DIAGNOSIS — S32000D Wedge compression fracture of unspecified lumbar vertebra, subsequent encounter for fracture with routine healing: Secondary | ICD-10-CM | POA: Diagnosis not present

## 2020-03-09 DIAGNOSIS — R262 Difficulty in walking, not elsewhere classified: Secondary | ICD-10-CM | POA: Diagnosis not present

## 2020-03-09 DIAGNOSIS — I420 Dilated cardiomyopathy: Secondary | ICD-10-CM | POA: Diagnosis not present

## 2020-03-09 DIAGNOSIS — Z20828 Contact with and (suspected) exposure to other viral communicable diseases: Secondary | ICD-10-CM | POA: Diagnosis not present

## 2020-03-09 DIAGNOSIS — I5022 Chronic systolic (congestive) heart failure: Secondary | ICD-10-CM | POA: Diagnosis not present

## 2020-03-10 DIAGNOSIS — K3184 Gastroparesis: Secondary | ICD-10-CM | POA: Diagnosis not present

## 2020-03-10 DIAGNOSIS — M170 Bilateral primary osteoarthritis of knee: Secondary | ICD-10-CM | POA: Diagnosis not present

## 2020-03-10 DIAGNOSIS — I482 Chronic atrial fibrillation, unspecified: Secondary | ICD-10-CM | POA: Diagnosis not present

## 2020-03-10 DIAGNOSIS — Z794 Long term (current) use of insulin: Secondary | ICD-10-CM | POA: Diagnosis not present

## 2020-03-10 DIAGNOSIS — E1151 Type 2 diabetes mellitus with diabetic peripheral angiopathy without gangrene: Secondary | ICD-10-CM | POA: Diagnosis not present

## 2020-03-10 DIAGNOSIS — R262 Difficulty in walking, not elsewhere classified: Secondary | ICD-10-CM | POA: Diagnosis not present

## 2020-03-10 DIAGNOSIS — I420 Dilated cardiomyopathy: Secondary | ICD-10-CM | POA: Diagnosis not present

## 2020-03-10 DIAGNOSIS — I5022 Chronic systolic (congestive) heart failure: Secondary | ICD-10-CM | POA: Diagnosis not present

## 2020-03-10 DIAGNOSIS — Z87891 Personal history of nicotine dependence: Secondary | ICD-10-CM | POA: Diagnosis not present

## 2020-03-10 DIAGNOSIS — Z79899 Other long term (current) drug therapy: Secondary | ICD-10-CM | POA: Diagnosis not present

## 2020-03-10 DIAGNOSIS — S32000D Wedge compression fracture of unspecified lumbar vertebra, subsequent encounter for fracture with routine healing: Secondary | ICD-10-CM | POA: Diagnosis not present

## 2020-03-10 DIAGNOSIS — E1143 Type 2 diabetes mellitus with diabetic autonomic (poly)neuropathy: Secondary | ICD-10-CM | POA: Diagnosis not present

## 2020-03-10 DIAGNOSIS — I872 Venous insufficiency (chronic) (peripheral): Secondary | ICD-10-CM | POA: Diagnosis not present

## 2020-03-10 DIAGNOSIS — S90811D Abrasion, right foot, subsequent encounter: Secondary | ICD-10-CM | POA: Diagnosis not present

## 2020-03-10 DIAGNOSIS — Z9989 Dependence on other enabling machines and devices: Secondary | ICD-10-CM | POA: Diagnosis not present

## 2020-03-10 DIAGNOSIS — M109 Gout, unspecified: Secondary | ICD-10-CM | POA: Diagnosis not present

## 2020-03-10 DIAGNOSIS — I110 Hypertensive heart disease with heart failure: Secondary | ICD-10-CM | POA: Diagnosis not present

## 2020-03-10 DIAGNOSIS — Z981 Arthrodesis status: Secondary | ICD-10-CM | POA: Diagnosis not present

## 2020-03-10 DIAGNOSIS — J449 Chronic obstructive pulmonary disease, unspecified: Secondary | ICD-10-CM | POA: Diagnosis not present

## 2020-03-10 DIAGNOSIS — G4733 Obstructive sleep apnea (adult) (pediatric): Secondary | ICD-10-CM | POA: Diagnosis not present

## 2020-03-14 ENCOUNTER — Telehealth: Payer: Self-pay | Admitting: Internal Medicine

## 2020-03-14 DIAGNOSIS — S32000D Wedge compression fracture of unspecified lumbar vertebra, subsequent encounter for fracture with routine healing: Secondary | ICD-10-CM | POA: Diagnosis not present

## 2020-03-14 DIAGNOSIS — R262 Difficulty in walking, not elsewhere classified: Secondary | ICD-10-CM | POA: Diagnosis not present

## 2020-03-14 DIAGNOSIS — I110 Hypertensive heart disease with heart failure: Secondary | ICD-10-CM | POA: Diagnosis not present

## 2020-03-14 DIAGNOSIS — I420 Dilated cardiomyopathy: Secondary | ICD-10-CM | POA: Diagnosis not present

## 2020-03-14 DIAGNOSIS — I5022 Chronic systolic (congestive) heart failure: Secondary | ICD-10-CM | POA: Diagnosis not present

## 2020-03-14 DIAGNOSIS — J449 Chronic obstructive pulmonary disease, unspecified: Secondary | ICD-10-CM | POA: Diagnosis not present

## 2020-03-14 NOTE — Telephone Encounter (Signed)
° ° °  Kim from [facility] calling to request order to increase Torsemide Patient has 3+ pitting edema in legs, swollen and shiny  Please call [phone], ok to leave message

## 2020-03-14 NOTE — Telephone Encounter (Signed)
Verbal orders given via VM 

## 2020-03-15 DIAGNOSIS — Z20828 Contact with and (suspected) exposure to other viral communicable diseases: Secondary | ICD-10-CM | POA: Diagnosis not present

## 2020-03-15 DIAGNOSIS — U071 COVID-19: Secondary | ICD-10-CM | POA: Diagnosis not present

## 2020-03-16 ENCOUNTER — Encounter (HOSPITAL_COMMUNITY): Payer: Self-pay | Admitting: Cardiology

## 2020-03-16 ENCOUNTER — Inpatient Hospital Stay (HOSPITAL_COMMUNITY)
Admission: EM | Admit: 2020-03-16 | Discharge: 2020-03-30 | DRG: 870 | Disposition: A | Discharge status: Other Acute Inpatient Hospital | Payer: Medicare Other | Source: Skilled Nursing Facility | Attending: Internal Medicine | Admitting: Internal Medicine

## 2020-03-16 ENCOUNTER — Emergency Department (HOSPITAL_COMMUNITY): Payer: Medicare Other

## 2020-03-16 ENCOUNTER — Other Ambulatory Visit: Payer: Self-pay | Admitting: Internal Medicine

## 2020-03-16 DIAGNOSIS — N400 Enlarged prostate without lower urinary tract symptoms: Secondary | ICD-10-CM | POA: Diagnosis present

## 2020-03-16 DIAGNOSIS — I447 Left bundle-branch block, unspecified: Secondary | ICD-10-CM | POA: Diagnosis present

## 2020-03-16 DIAGNOSIS — A408 Other streptococcal sepsis: Secondary | ICD-10-CM | POA: Diagnosis not present

## 2020-03-16 DIAGNOSIS — G9341 Metabolic encephalopathy: Secondary | ICD-10-CM | POA: Diagnosis not present

## 2020-03-16 DIAGNOSIS — R0602 Shortness of breath: Secondary | ICD-10-CM

## 2020-03-16 DIAGNOSIS — Z953 Presence of xenogenic heart valve: Secondary | ICD-10-CM | POA: Diagnosis not present

## 2020-03-16 DIAGNOSIS — D649 Anemia, unspecified: Secondary | ICD-10-CM | POA: Diagnosis present

## 2020-03-16 DIAGNOSIS — J189 Pneumonia, unspecified organism: Secondary | ICD-10-CM

## 2020-03-16 DIAGNOSIS — B954 Other streptococcus as the cause of diseases classified elsewhere: Secondary | ICD-10-CM | POA: Diagnosis not present

## 2020-03-16 DIAGNOSIS — I10 Essential (primary) hypertension: Secondary | ICD-10-CM | POA: Diagnosis not present

## 2020-03-16 DIAGNOSIS — Z7189 Other specified counseling: Secondary | ICD-10-CM

## 2020-03-16 DIAGNOSIS — Z8041 Family history of malignant neoplasm of ovary: Secondary | ICD-10-CM

## 2020-03-16 DIAGNOSIS — J9621 Acute and chronic respiratory failure with hypoxia: Secondary | ICD-10-CM | POA: Diagnosis not present

## 2020-03-16 DIAGNOSIS — E118 Type 2 diabetes mellitus with unspecified complications: Secondary | ICD-10-CM | POA: Diagnosis present

## 2020-03-16 DIAGNOSIS — J690 Pneumonitis due to inhalation of food and vomit: Secondary | ICD-10-CM | POA: Diagnosis not present

## 2020-03-16 DIAGNOSIS — I509 Heart failure, unspecified: Secondary | ICD-10-CM | POA: Diagnosis not present

## 2020-03-16 DIAGNOSIS — Z20822 Contact with and (suspected) exposure to covid-19: Secondary | ICD-10-CM | POA: Diagnosis present

## 2020-03-16 DIAGNOSIS — F419 Anxiety disorder, unspecified: Secondary | ICD-10-CM | POA: Diagnosis present

## 2020-03-16 DIAGNOSIS — I110 Hypertensive heart disease with heart failure: Secondary | ICD-10-CM | POA: Diagnosis present

## 2020-03-16 DIAGNOSIS — R6520 Severe sepsis without septic shock: Secondary | ICD-10-CM

## 2020-03-16 DIAGNOSIS — I38 Endocarditis, valve unspecified: Secondary | ICD-10-CM | POA: Diagnosis present

## 2020-03-16 DIAGNOSIS — A419 Sepsis, unspecified organism: Secondary | ICD-10-CM | POA: Diagnosis not present

## 2020-03-16 DIAGNOSIS — I5042 Chronic combined systolic (congestive) and diastolic (congestive) heart failure: Secondary | ICD-10-CM | POA: Diagnosis present

## 2020-03-16 DIAGNOSIS — J449 Chronic obstructive pulmonary disease, unspecified: Secondary | ICD-10-CM | POA: Diagnosis not present

## 2020-03-16 DIAGNOSIS — R0902 Hypoxemia: Secondary | ICD-10-CM | POA: Diagnosis not present

## 2020-03-16 DIAGNOSIS — J9601 Acute respiratory failure with hypoxia: Secondary | ICD-10-CM | POA: Diagnosis not present

## 2020-03-16 DIAGNOSIS — R009 Unspecified abnormalities of heart beat: Secondary | ICD-10-CM | POA: Diagnosis present

## 2020-03-16 DIAGNOSIS — R1310 Dysphagia, unspecified: Secondary | ICD-10-CM | POA: Diagnosis not present

## 2020-03-16 DIAGNOSIS — R0689 Other abnormalities of breathing: Secondary | ICD-10-CM

## 2020-03-16 DIAGNOSIS — G4733 Obstructive sleep apnea (adult) (pediatric): Secondary | ICD-10-CM | POA: Diagnosis present

## 2020-03-16 DIAGNOSIS — R918 Other nonspecific abnormal finding of lung field: Secondary | ICD-10-CM | POA: Diagnosis not present

## 2020-03-16 DIAGNOSIS — E11622 Type 2 diabetes mellitus with other skin ulcer: Secondary | ICD-10-CM | POA: Diagnosis not present

## 2020-03-16 DIAGNOSIS — Z4682 Encounter for fitting and adjustment of non-vascular catheter: Secondary | ICD-10-CM | POA: Diagnosis not present

## 2020-03-16 DIAGNOSIS — Z811 Family history of alcohol abuse and dependence: Secondary | ICD-10-CM

## 2020-03-16 DIAGNOSIS — J9611 Chronic respiratory failure with hypoxia: Secondary | ICD-10-CM | POA: Diagnosis not present

## 2020-03-16 DIAGNOSIS — J811 Chronic pulmonary edema: Secondary | ICD-10-CM | POA: Diagnosis not present

## 2020-03-16 DIAGNOSIS — Z87891 Personal history of nicotine dependence: Secondary | ICD-10-CM | POA: Diagnosis not present

## 2020-03-16 DIAGNOSIS — Z833 Family history of diabetes mellitus: Secondary | ICD-10-CM

## 2020-03-16 DIAGNOSIS — Z981 Arthrodesis status: Secondary | ICD-10-CM

## 2020-03-16 DIAGNOSIS — J9600 Acute respiratory failure, unspecified whether with hypoxia or hypercapnia: Secondary | ICD-10-CM | POA: Diagnosis not present

## 2020-03-16 DIAGNOSIS — E1165 Type 2 diabetes mellitus with hyperglycemia: Secondary | ICD-10-CM | POA: Diagnosis not present

## 2020-03-16 DIAGNOSIS — Z8261 Family history of arthritis: Secondary | ICD-10-CM

## 2020-03-16 DIAGNOSIS — Z96641 Presence of right artificial hip joint: Secondary | ICD-10-CM | POA: Diagnosis present

## 2020-03-16 DIAGNOSIS — Z91048 Other nonmedicinal substance allergy status: Secondary | ICD-10-CM

## 2020-03-16 DIAGNOSIS — R5381 Other malaise: Secondary | ICD-10-CM | POA: Diagnosis not present

## 2020-03-16 DIAGNOSIS — L89613 Pressure ulcer of right heel, stage 3: Secondary | ICD-10-CM | POA: Diagnosis present

## 2020-03-16 DIAGNOSIS — I5022 Chronic systolic (congestive) heart failure: Secondary | ICD-10-CM

## 2020-03-16 DIAGNOSIS — Z8679 Personal history of other diseases of the circulatory system: Secondary | ICD-10-CM

## 2020-03-16 DIAGNOSIS — R000 Tachycardia, unspecified: Secondary | ICD-10-CM | POA: Diagnosis not present

## 2020-03-16 DIAGNOSIS — L89312 Pressure ulcer of right buttock, stage 2: Secondary | ICD-10-CM | POA: Diagnosis present

## 2020-03-16 DIAGNOSIS — R7881 Bacteremia: Secondary | ICD-10-CM | POA: Diagnosis not present

## 2020-03-16 DIAGNOSIS — I482 Chronic atrial fibrillation, unspecified: Secondary | ICD-10-CM | POA: Diagnosis present

## 2020-03-16 DIAGNOSIS — Z888 Allergy status to other drugs, medicaments and biological substances status: Secondary | ICD-10-CM

## 2020-03-16 DIAGNOSIS — R069 Unspecified abnormalities of breathing: Secondary | ICD-10-CM

## 2020-03-16 DIAGNOSIS — B955 Unspecified streptococcus as the cause of diseases classified elsewhere: Secondary | ICD-10-CM | POA: Diagnosis not present

## 2020-03-16 DIAGNOSIS — Z978 Presence of other specified devices: Secondary | ICD-10-CM | POA: Diagnosis not present

## 2020-03-16 DIAGNOSIS — J4489 Other specified chronic obstructive pulmonary disease: Secondary | ICD-10-CM | POA: Diagnosis present

## 2020-03-16 DIAGNOSIS — Z515 Encounter for palliative care: Secondary | ICD-10-CM | POA: Diagnosis not present

## 2020-03-16 DIAGNOSIS — L89159 Pressure ulcer of sacral region, unspecified stage: Secondary | ICD-10-CM | POA: Diagnosis not present

## 2020-03-16 DIAGNOSIS — I517 Cardiomegaly: Secondary | ICD-10-CM | POA: Diagnosis not present

## 2020-03-16 DIAGNOSIS — J9809 Other diseases of bronchus, not elsewhere classified: Secondary | ICD-10-CM | POA: Diagnosis not present

## 2020-03-16 DIAGNOSIS — I2510 Atherosclerotic heart disease of native coronary artery without angina pectoris: Secondary | ICD-10-CM | POA: Diagnosis present

## 2020-03-16 DIAGNOSIS — Z7901 Long term (current) use of anticoagulants: Secondary | ICD-10-CM | POA: Diagnosis not present

## 2020-03-16 DIAGNOSIS — J90 Pleural effusion, not elsewhere classified: Secondary | ICD-10-CM | POA: Diagnosis not present

## 2020-03-16 DIAGNOSIS — J9690 Respiratory failure, unspecified, unspecified whether with hypoxia or hypercapnia: Secondary | ICD-10-CM

## 2020-03-16 DIAGNOSIS — F332 Major depressive disorder, recurrent severe without psychotic features: Secondary | ICD-10-CM | POA: Diagnosis present

## 2020-03-16 DIAGNOSIS — J440 Chronic obstructive pulmonary disease with acute lower respiratory infection: Secondary | ICD-10-CM | POA: Diagnosis present

## 2020-03-16 DIAGNOSIS — R5383 Other fatigue: Secondary | ICD-10-CM

## 2020-03-16 DIAGNOSIS — I959 Hypotension, unspecified: Secondary | ICD-10-CM | POA: Diagnosis not present

## 2020-03-16 DIAGNOSIS — Z4659 Encounter for fitting and adjustment of other gastrointestinal appliance and device: Secondary | ICD-10-CM

## 2020-03-16 DIAGNOSIS — E876 Hypokalemia: Secondary | ICD-10-CM | POA: Diagnosis not present

## 2020-03-16 DIAGNOSIS — J9622 Acute and chronic respiratory failure with hypercapnia: Secondary | ICD-10-CM | POA: Diagnosis not present

## 2020-03-16 DIAGNOSIS — R609 Edema, unspecified: Secondary | ICD-10-CM | POA: Diagnosis not present

## 2020-03-16 DIAGNOSIS — Z794 Long term (current) use of insulin: Secondary | ICD-10-CM

## 2020-03-16 DIAGNOSIS — Z79899 Other long term (current) drug therapy: Secondary | ICD-10-CM

## 2020-03-16 DIAGNOSIS — Z801 Family history of malignant neoplasm of trachea, bronchus and lung: Secondary | ICD-10-CM

## 2020-03-16 DIAGNOSIS — R0600 Dyspnea, unspecified: Secondary | ICD-10-CM

## 2020-03-16 DIAGNOSIS — R509 Fever, unspecified: Secondary | ICD-10-CM

## 2020-03-16 DIAGNOSIS — J9811 Atelectasis: Secondary | ICD-10-CM | POA: Diagnosis not present

## 2020-03-16 DIAGNOSIS — Z7984 Long term (current) use of oral hypoglycemic drugs: Secondary | ICD-10-CM

## 2020-03-16 DIAGNOSIS — I5023 Acute on chronic systolic (congestive) heart failure: Secondary | ICD-10-CM | POA: Diagnosis not present

## 2020-03-16 DIAGNOSIS — Z8249 Family history of ischemic heart disease and other diseases of the circulatory system: Secondary | ICD-10-CM

## 2020-03-16 DIAGNOSIS — L89322 Pressure ulcer of left buttock, stage 2: Secondary | ICD-10-CM | POA: Diagnosis present

## 2020-03-16 LAB — URINALYSIS, ROUTINE W REFLEX MICROSCOPIC
Bacteria, UA: NONE SEEN
Bilirubin Urine: NEGATIVE
Bilirubin Urine: NEGATIVE
Glucose, UA: NEGATIVE mg/dL
Glucose, UA: NEGATIVE mg/dL
Hgb urine dipstick: NEGATIVE
Hgb urine dipstick: NEGATIVE
Ketones, ur: NEGATIVE mg/dL
Ketones, ur: NEGATIVE mg/dL
Leukocytes,Ua: NEGATIVE
Nitrite: NEGATIVE
Nitrite: NEGATIVE
Protein, ur: NEGATIVE mg/dL
Protein, ur: NEGATIVE mg/dL
Specific Gravity, Urine: 1.017 (ref 1.005–1.030)
Specific Gravity, Urine: 1.017 (ref 1.005–1.030)
pH: 7 (ref 5.0–8.0)
pH: 7 (ref 5.0–8.0)

## 2020-03-16 LAB — COMPREHENSIVE METABOLIC PANEL
ALT: 10 U/L (ref 0–44)
AST: 15 U/L (ref 15–41)
Albumin: 3 g/dL — ABNORMAL LOW (ref 3.5–5.0)
Alkaline Phosphatase: 75 U/L (ref 38–126)
Anion gap: 10 (ref 5–15)
BUN: 24 mg/dL — ABNORMAL HIGH (ref 8–23)
CO2: 32 mmol/L (ref 22–32)
Calcium: 8.6 mg/dL — ABNORMAL LOW (ref 8.9–10.3)
Chloride: 99 mmol/L (ref 98–111)
Creatinine, Ser: 1.04 mg/dL (ref 0.61–1.24)
GFR calc Af Amer: >60 mL/min (ref >60)
GFR calc non Af Amer: >60 mL/min (ref >60)
Glucose, Bld: 120 mg/dL — ABNORMAL HIGH (ref 70–99)
Potassium: 4.2 mmol/L (ref 3.5–5.1)
Sodium: 141 mmol/L (ref 135–145)
Total Bilirubin: 0.9 mg/dL (ref 0.3–1.2)
Total Protein: 6.5 g/dL (ref 6.5–8.1)

## 2020-03-16 LAB — CBC WITH DIFFERENTIAL/PLATELET
Abs Immature Granulocytes: 0.07 10*3/uL (ref 0.00–0.07)
Basophils Absolute: 0 10*3/uL (ref 0.0–0.1)
Basophils Relative: 0 %
Eosinophils Absolute: 0 10*3/uL (ref 0.0–0.5)
Eosinophils Relative: 0 %
HCT: 38.8 % — ABNORMAL LOW (ref 39.0–52.0)
Hemoglobin: 11.7 g/dL — ABNORMAL LOW (ref 13.0–17.0)
Immature Granulocytes: 0 %
Lymphocytes Relative: 3 %
Lymphs Abs: 0.5 10*3/uL — ABNORMAL LOW (ref 0.7–4.0)
MCH: 29 pg (ref 26.0–34.0)
MCHC: 30.2 g/dL (ref 30.0–36.0)
MCV: 96 fL (ref 80.0–100.0)
Monocytes Absolute: 1.2 10*3/uL — ABNORMAL HIGH (ref 0.1–1.0)
Monocytes Relative: 7 %
Neutro Abs: 14.9 10*3/uL — ABNORMAL HIGH (ref 1.7–7.7)
Neutrophils Relative %: 90 %
Platelets: 148 10*3/uL — ABNORMAL LOW (ref 150–400)
RBC: 4.04 MIL/uL — ABNORMAL LOW (ref 4.22–5.81)
RDW: 17.1 % — ABNORMAL HIGH (ref 11.5–15.5)
WBC: 16.7 10*3/uL — ABNORMAL HIGH (ref 4.0–10.5)
nRBC: 0 % (ref 0.0–0.2)

## 2020-03-16 LAB — PROTIME-INR
INR: 2.3 — ABNORMAL HIGH (ref 0.8–1.2)
Prothrombin Time: 24.8 seconds — ABNORMAL HIGH (ref 11.4–15.2)

## 2020-03-16 LAB — APTT: aPTT: 45 seconds — ABNORMAL HIGH (ref 24–36)

## 2020-03-16 LAB — LACTIC ACID, PLASMA
Lactic Acid, Venous: 1.4 mmol/L (ref 0.5–1.9)
Lactic Acid, Venous: 1.3 mmol/L (ref 0.5–1.9)

## 2020-03-16 LAB — SARS CORONAVIRUS 2 BY RT PCR (HOSPITAL ORDER, PERFORMED IN ~~LOC~~ HOSPITAL LAB): SARS Coronavirus 2: NEGATIVE

## 2020-03-16 MED ORDER — SODIUM CHLORIDE 0.9 % IV SOLN: INTRAVENOUS | Status: DC

## 2020-03-16 MED ORDER — ACETAMINOPHEN 325 MG PO TABS: 325.0000 mg | ORAL_TABLET | Freq: Four times a day (QID) | ORAL | Status: DC | PRN

## 2020-03-16 MED ORDER — ONDANSETRON HCL 4 MG PO TABS: 4.0000 mg | ORAL_TABLET | Freq: Four times a day (QID) | ORAL | Status: DC | PRN

## 2020-03-16 MED ORDER — FLUTICASONE FUROATE-VILANTEROL 100-25 MCG/INH IN AEPB: 1.0000 | INHALATION_SPRAY | Freq: Every day | RESPIRATORY_TRACT | Status: DC

## 2020-03-16 MED ORDER — INSULIN ASPART 100 UNIT/ML ~~LOC~~ SOLN
0.0000 [IU] | Freq: Three times a day (TID) | SUBCUTANEOUS | Status: DC
  Administered 2020-03-18 – 2020-03-21 (×6): 1 [IU] via SUBCUTANEOUS

## 2020-03-16 MED ORDER — INSULIN ASPART 100 UNIT/ML ~~LOC~~ SOLN: 0.0000 [IU] | Freq: Every day | SUBCUTANEOUS | Status: DC

## 2020-03-16 MED ORDER — SODIUM CHLORIDE 0.9 % IV SOLN: 1.0000 g | INTRAVENOUS | Status: DC

## 2020-03-16 MED ORDER — DOXEPIN HCL 10 MG/ML PO CONC
3.0000 mg | Freq: Every day | ORAL | Status: DC
  Filled 2020-03-16 (×4): qty 0.3

## 2020-03-16 MED ORDER — FERROUS SULFATE 325 (65 FE) MG PO TABS
325.0000 mg | ORAL_TABLET | Freq: Two times a day (BID) | ORAL | Status: DC
  Administered 2020-03-17 – 2020-03-21 (×9): 325 mg via ORAL
  Filled 2020-03-16 (×10): qty 1

## 2020-03-16 MED ORDER — DOXEPIN HCL 10 MG/ML PO CONC
3.0000 mg | Freq: Every day | ORAL | Status: DC
  Filled 2020-03-16 (×5): qty 0.3

## 2020-03-16 MED ORDER — SODIUM CHLORIDE 0.9 % IV SOLN: 250.0000 mL | INTRAVENOUS | Status: DC | PRN

## 2020-03-16 MED ORDER — POLYETHYLENE GLYCOL 3350 17 G PO PACK: 17.0000 g | PACK | Freq: Every day | ORAL | Status: DC | PRN

## 2020-03-16 MED ORDER — PANTOPRAZOLE SODIUM 40 MG PO PACK
40.0000 mg | PACK | Freq: Every day | ORAL | Status: DC
  Administered 2020-03-16 – 2020-03-18 (×3): 40 mg
  Filled 2020-03-16 (×6): qty 20

## 2020-03-16 MED ORDER — ACETAMINOPHEN 500 MG PO TABS
1000.0000 mg | ORAL_TABLET | Freq: Once | ORAL | Status: AC
  Administered 2020-03-16: 1000 mg via ORAL
  Filled 2020-03-16: qty 2

## 2020-03-16 MED ORDER — CARVEDILOL 3.125 MG PO TABS
3.1250 mg | ORAL_TABLET | Freq: Two times a day (BID) | ORAL | Status: DC
  Administered 2020-03-17 – 2020-03-26 (×16): 3.125 mg
  Filled 2020-03-16 (×16): qty 1

## 2020-03-16 MED ORDER — SODIUM CHLORIDE 0.9% FLUSH
3.0000 mL | Freq: Two times a day (BID) | INTRAVENOUS | Status: DC
  Administered 2020-03-17 – 2020-03-29 (×23): 3 mL via INTRAVENOUS

## 2020-03-16 MED ORDER — SODIUM CHLORIDE 0.9 % IV BOLUS
1000.0000 mL | Freq: Once | INTRAVENOUS | Status: AC
  Administered 2020-03-16: 1000 mL via INTRAVENOUS

## 2020-03-16 MED ORDER — ENSURE ENLIVE PO LIQD
Freq: Two times a day (BID) | ORAL | Status: DC
  Administered 2020-03-17 – 2020-03-19 (×3): 237 mL via ORAL
  Filled 2020-03-16 (×2): qty 237

## 2020-03-16 MED ORDER — ADULT MULTIVITAMIN W/MINERALS CH
1.0000 | ORAL_TABLET | Freq: Every day | ORAL | Status: DC
  Administered 2020-03-16 – 2020-03-22 (×7): 1
  Filled 2020-03-16 (×7): qty 1

## 2020-03-16 MED ORDER — SODIUM CHLORIDE 0.9 % IV SOLN
500.0000 mg | INTRAVENOUS | Status: DC
  Administered 2020-03-17 – 2020-03-20 (×4): 500 mg via INTRAVENOUS
  Filled 2020-03-16 (×4): qty 500

## 2020-03-16 MED ORDER — FLUTICASONE-UMECLIDIN-VILANT 100-62.5-25 MCG/INH IN AEPB: 1.0000 | INHALATION_SPRAY | Freq: Every day | RESPIRATORY_TRACT | Status: DC

## 2020-03-16 MED ORDER — ALBUTEROL SULFATE (2.5 MG/3ML) 0.083% IN NEBU
2.5000 mg | INHALATION_SOLUTION | Freq: Four times a day (QID) | RESPIRATORY_TRACT | Status: DC | PRN
  Administered 2020-03-19 – 2020-03-20 (×3): 2.5 mg via RESPIRATORY_TRACT
  Filled 2020-03-16 (×4): qty 3

## 2020-03-16 MED ORDER — SODIUM CHLORIDE 0.9 % IV SOLN
500.0000 mg | Freq: Once | INTRAVENOUS | Status: AC
  Administered 2020-03-16: 500 mg via INTRAVENOUS
  Filled 2020-03-16: qty 500

## 2020-03-16 MED ORDER — VILAZODONE HCL 40 MG PO TABS
40.0000 mg | ORAL_TABLET | Freq: Every day | ORAL | Status: DC
  Administered 2020-03-17 – 2020-03-28 (×10): 40 mg via ORAL
  Filled 2020-03-16 (×19): qty 1

## 2020-03-16 MED ORDER — TAMSULOSIN HCL 0.4 MG PO CAPS
0.4000 mg | ORAL_CAPSULE | Freq: Every day | ORAL | Status: DC
  Administered 2020-03-17 – 2020-03-30 (×11): 0.4 mg via ORAL
  Filled 2020-03-16 (×13): qty 1

## 2020-03-16 MED ORDER — TRAZODONE HCL 50 MG PO TABS: 50.0000 mg | ORAL_TABLET | Freq: Every evening | ORAL | Status: DC | PRN

## 2020-03-16 MED ORDER — RIVAROXABAN 20 MG PO TABS
20.0000 mg | ORAL_TABLET | Freq: Every day | ORAL | Status: DC
  Administered 2020-03-17 – 2020-03-29 (×12): 20 mg
  Filled 2020-03-16 (×12): qty 1

## 2020-03-16 MED ORDER — ASCORBIC ACID 500 MG PO TABS
500.0000 mg | ORAL_TABLET | Freq: Every day | ORAL | Status: DC
  Administered 2020-03-16 – 2020-03-21 (×6): 500 mg via ORAL
  Filled 2020-03-16 (×6): qty 1

## 2020-03-16 MED ORDER — ONDANSETRON HCL 4 MG/2ML IJ SOLN: 4.0000 mg | Freq: Four times a day (QID) | INTRAMUSCULAR | Status: DC | PRN

## 2020-03-16 MED ORDER — ROSUVASTATIN CALCIUM 5 MG PO TABS
5.0000 mg | ORAL_TABLET | Freq: Every day | ORAL | Status: DC
  Administered 2020-03-16 – 2020-03-21 (×6): 5 mg via ORAL
  Filled 2020-03-16 (×8): qty 1

## 2020-03-16 MED ORDER — SODIUM CHLORIDE 0.9 % IV SOLN
1.0000 g | Freq: Once | INTRAVENOUS | Status: AC
  Administered 2020-03-16: 1 g via INTRAVENOUS
  Filled 2020-03-16: qty 10

## 2020-03-16 MED ORDER — OXYCODONE HCL 5 MG PO TABS
5.0000 mg | ORAL_TABLET | Freq: Four times a day (QID) | ORAL | Status: DC | PRN
  Filled 2020-03-16: qty 1

## 2020-03-16 MED ORDER — SODIUM CHLORIDE 0.9% FLUSH: 3.0000 mL | INTRAVENOUS | Status: DC | PRN

## 2020-03-16 MED ORDER — UMECLIDINIUM BROMIDE 62.5 MCG/INH IN AEPB: 1.0000 | INHALATION_SPRAY | Freq: Every day | RESPIRATORY_TRACT | Status: DC

## 2020-03-16 MED ORDER — METOCLOPRAMIDE HCL 10 MG PO TABS
5.0000 mg | ORAL_TABLET | Freq: Three times a day (TID) | ORAL | Status: DC
  Administered 2020-03-17 – 2020-03-21 (×12): 5 mg via ORAL
  Filled 2020-03-16 (×13): qty 1

## 2020-03-16 MED ORDER — MAGNESIUM OXIDE 400 (241.3 MG) MG PO TABS
400.0000 mg | ORAL_TABLET | Freq: Two times a day (BID) | ORAL | Status: DC
  Administered 2020-03-16 – 2020-03-21 (×10): 400 mg via ORAL
  Filled 2020-03-16 (×18): qty 1
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
## 2020-03-16 NOTE — Sepsis Progress Note (Signed)
Notified bedside nurse of need to draw lactic acid and blood cultures.  

## 2020-03-16 NOTE — ED Triage Notes (Signed)
From [facility] with fever 105.  Wounds to lower extremities and sacral area.   Alert and Oriented.

## 2020-03-17 ENCOUNTER — Other Ambulatory Visit: Payer: Self-pay

## 2020-03-17 DIAGNOSIS — I482 Chronic atrial fibrillation, unspecified: Secondary | ICD-10-CM

## 2020-03-17 LAB — URINE CULTURE

## 2020-03-17 LAB — CBC
HCT: 37.6 % — ABNORMAL LOW (ref 39.0–52.0)
Hemoglobin: 11.2 g/dL — ABNORMAL LOW (ref 13.0–17.0)
MCH: 29.3 pg (ref 26.0–34.0)
MCHC: 29.8 g/dL — ABNORMAL LOW (ref 30.0–36.0)
MCV: 98.4 fL (ref 80.0–100.0)
Platelets: 116 10*3/uL — ABNORMAL LOW (ref 150–400)
RBC: 3.82 MIL/uL — ABNORMAL LOW (ref 4.22–5.81)
RDW: 17.3 % — ABNORMAL HIGH (ref 11.5–15.5)
WBC: 9.9 10*3/uL (ref 4.0–10.5)
nRBC: 0 % (ref 0.0–0.2)

## 2020-03-17 LAB — BLOOD CULTURE ID PANEL (REFLEXED) - BCID2

## 2020-03-17 LAB — GLUCOSE, CAPILLARY
Glucose-Capillary: 83 mg/dL (ref 70–99)
Glucose-Capillary: 83 mg/dL (ref 70–99)
Glucose-Capillary: 117 mg/dL — ABNORMAL HIGH (ref 70–99)
Glucose-Capillary: 141 mg/dL — ABNORMAL HIGH (ref 70–99)

## 2020-03-17 LAB — BASIC METABOLIC PANEL
Anion gap: 7 (ref 5–15)
BUN: 25 mg/dL — ABNORMAL HIGH (ref 8–23)
CO2: 30 mmol/L (ref 22–32)
Calcium: 8.3 mg/dL — ABNORMAL LOW (ref 8.9–10.3)
Chloride: 104 mmol/L (ref 98–111)
Creatinine, Ser: 0.87 mg/dL (ref 0.61–1.24)
GFR calc Af Amer: >60 mL/min (ref >60)
GFR calc non Af Amer: >60 mL/min (ref >60)
Glucose, Bld: 82 mg/dL (ref 70–99)
Potassium: 4 mmol/L (ref 3.5–5.1)
Sodium: 141 mmol/L (ref 135–145)

## 2020-03-17 LAB — MRSA PCR SCREENING: MRSA by PCR: NEGATIVE

## 2020-03-17 MED ORDER — UMECLIDINIUM BROMIDE 62.5 MCG/INH IN AEPB
1.0000 | INHALATION_SPRAY | Freq: Every day | RESPIRATORY_TRACT | Status: DC
  Administered 2020-03-18 – 2020-03-21 (×4): 1 via RESPIRATORY_TRACT
  Filled 2020-03-17: qty 7

## 2020-03-17 MED ORDER — FLUTICASONE FUROATE-VILANTEROL 100-25 MCG/INH IN AEPB
1.0000 | INHALATION_SPRAY | Freq: Every day | RESPIRATORY_TRACT | Status: DC
  Administered 2020-03-18 – 2020-03-21 (×4): 1 via RESPIRATORY_TRACT
  Filled 2020-03-17: qty 28

## 2020-03-17 MED ORDER — SODIUM CHLORIDE 0.9 % IV SOLN
2.0000 g | INTRAVENOUS | Status: DC
  Administered 2020-03-17 – 2020-03-30 (×14): 2 g via INTRAVENOUS
  Filled 2020-03-17 (×15): qty 20

## 2020-03-17 NOTE — TOC Initial Note (Signed)
Transition of Care [facility]) - Initial/Assessment Note    Patient Details  Name: Cody Hale MRN: [id number] Date of Birth: 1951-04-03  Transition of Care [facility] CM/SW Contact:    Villa Herb, LCSWA Phone Number: 03/17/2020, 11:28 AM  Clinical Narrative:                 Pt admitted for sepsis due to pneumonia. Pt high risk for readmission. Pt from [place]. TOC spoke with Nettie Elm from [facility] to learn pts baseline. Pt able to feed himself. Pt needs assistance with bathing and dressing. Pt receives PT and OT 2-3 times weekly. Pt to return to [facility] upon discharge.     Expected Discharge Plan: Assisted Living Barriers to Discharge: Continued Medical Work up   Patient Goals and CMS Choice Patient states their goals for this hospitalization and ongoing recovery are:: Return to [facility]   Choice offered to / list presented to : NA  Expected Discharge Plan and Services Expected Discharge Plan: Assisted Living     Post Acute Care Choice: NA Living arrangements for the past 2 months: Assisted Living Facility                 DME Arranged: N/A DME Agency: NA       HH Arranged: NA HH Agency: NA        Prior Living Arrangements/Services Living arrangements for the past 2 months: Assisted Living Facility Lives with:: Facility Resident (Assisted Living Facility) Patient language and need for interpreter reviewed:: Yes Do you feel safe going back to the place where you live?: Yes        Care giver support system in place?: Yes (comment)   Criminal Activity/Legal Involvement Pertinent to Current Situation/Hospitalization: No - Comment as needed  Activities of Daily Living Home Assistive Devices/Equipment: Wheelchair ADL Screening (condition at time of admission) Patient's cognitive ability adequate to safely complete daily activities?: Yes Is the patient deaf or have difficulty hearing?: No Does the patient have difficulty seeing, even when wearing glasses/contacts?:  No Does the patient have difficulty concentrating, remembering, or making decisions?: No Patient able to express need for assistance with ADLs?: Yes Does the patient have difficulty dressing or bathing?: Yes Independently performs ADLs?: No Communication: Dependent Is this a change from baseline?: Pre-admission baseline Dressing (OT): Dependent Is this a change from baseline?: Pre-admission baseline Grooming: Dependent Is this a change from baseline?: Pre-admission baseline Feeding: Independent Bathing: Dependent Is this a change from baseline?: Pre-admission baseline Toileting: Dependent Is this a change from baseline?: Pre-admission baseline In/Out Bed: Dependent Is this a change from baseline?: Pre-admission baseline Walks in Home: Dependent Is this a change from baseline?: Pre-admission baseline Does the patient have difficulty walking or climbing stairs?: Yes Weakness of Legs: None Weakness of Arms/Hands: None  Permission Sought/Granted                  Emotional Assessment       Orientation: : Oriented to Self, Oriented to Place, Oriented to  Time, Oriented to Situation   Psych Involvement: No (comment)  Admission diagnosis:  Hypoxia [R09.02] PNA (pneumonia) [J18.9] Malaise and fatigue [R53.81, R53.83] Fever in adult [R50.9] Community acquired pneumonia, unspecified laterality [J18.9] Patient Active Problem List   Diagnosis Date Noted  . PNA (pneumonia) 03/16/2020  . Sepsis due to pneumonia 03/16/2020  . Need for Tdap vaccination 03/02/2020  . Need for influenza vaccination 03/02/2020  . Iron deficiency anemia due to chronic blood loss 03/02/2020  . Deficiency anemia  03/01/2020  . Allergic rhinitis due to pollen 03/01/2020  . NSVT (nonsustained ventricular tachycardia) (HCC)   . COVID-19   . COPD with acute exacerbation (HCC) 01/13/2019  . Chronic respiratory failure with hypoxia (HCC)   . Major depressive disorder, recurrent severe without psychotic  features (HCC) 05/19/2018  . GERD with esophagitis 04/22/2018  . Nonischemic cardiomyopathy (HCC) 03/23/2018  . Therapeutic opioid-induced constipation (OIC) 12/04/2017  . Venous stasis dermatitis of both lower extremities 07/31/2017  . Unilateral primary osteoarthritis, right hip 04/22/2017  . 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 Seasonal allergic rhinitis due to pollen 03/13/2016  . Chronic atrial fibrillation 01/11/2015  . S/P mitral valve replacement with bioprosthetic valve 01/11/2015  . Depression with somatization 11/22/2014  . Primary osteoarthritis of both knees 11/22/2014  . COPD (chronic obstructive pulmonary disease) with chronic bronchitis (HCC) 05/12/2014  . B12 deficiency anemia 02/03/2014  . Routine general medical examination at a health care facility 03/21/2013  . Hyperlipidemia with target LDL less than 70 03/19/2013  . Chronic systolic CHF (congestive heart failure) (HCC) 02/17/2013  . Essential hypertension, malignant 12/01/2012  . Type II diabetes mellitus with manifestations (HCC)   . Gastroparesis due to DM (HCC) 11/30/2012  . OSA (obstructive sleep apnea) 11/16/2012  . Endocarditis-resolved 08/26/2012  . Class 2 severe obesity due to excess calories with serious comorbidity and body mass index (BMI) of 35.0 to 35.9 in adult [facility]) 06/27/2012   PCP:  [name], MD Pharmacy:   [facility] [place] - [address] [place] [address] Phone: [phone] Fax: [phone]     Social Determinants of Health (SDOH) Interventions    Readmission Risk Interventions No flowsheet data found.

## 2020-03-17 NOTE — Progress Notes (Signed)
Patient Demographics:    Cody Hale, is a 69 y.o. male, DOB - 05-13-1951, ZOX:[id number]  Admit date - 03/16/2020   Admitting Physician Joclyn Alsobrook Mariea Clonts, MD  Outpatient Primary MD for the patient is [name], MD  LOS - 1   Chief Complaint  Patient presents with  . Fever        Subjective:    Cody Hale today has  no emesis,  No chest pain,  -T-max 102 T-current 98.2 Slight cough  Assessment  & Plan :    Principal Problem:   Sepsis due to pneumonia Active Problems:   PNA (pneumonia)   OSA (obstructive sleep apnea)   Essential hypertension, malignant   Type II diabetes mellitus with manifestations (HCC)   Chronic systolic CHF (congestive heart failure) (HCC)   COPD (chronic obstructive pulmonary disease) with chronic bronchitis (HCC)   Chronic atrial fibrillation   S/P mitral valve replacement with bioprosthetic valve   Major depressive disorder, recurrent severe without psychotic features (HCC)   Chronic respiratory failure with hypoxia (HCC)  Brief Summary:- 69 y.o. 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, chronic atrial fibrillation with chronic anticoagulation, COPD, diabetes mellitus , h/o T11/L4 fusion after lumbar fracture 11/17/20withWound dehiscence noted12/7/20 Prior history of dysphagia requiring PEG tube placement 06/04/2019 chronic hypoxic respiratory failure admitted on 03/17/2019 from [facility] with acute on chronic hypoxic respiratory failure secondary to community-acquired pneumonia  A/p 1) community-acquired pneumonia---  --In the ED chest x-ray was suggestive of pulmonary venous congestion versus pneumonia -However given fevers, leukocytosis cough and dyspnea and hypoxia favor pneumonia -Continue IV Rocephin and azithromycin, bronchodilators and  mucolytics -Repeat chest x-ray on 03/18/2020   2) sepsis secondary to #1 above--- patient had leukocytosis, tachypnea, tachycardia and fevers --- as well as confusion and hypoxia -- suspect secondary to #1 above -UA not suggestive of UTI -Patient's right foot wound and sacral wounds do not look particularly infected--please see photos in epic --Lactic acid is not elevated -Antibiotics as above #1 -Preliminary blood cultures with strep, await final ID--may need echocardiogram -WBC is down to 9.9 from 16.7  3) chronic anemia----hemoglobin currently above 11 which is higher than recent baseline, -No bleeding concerns at this time  4) chronic atrial fibrillation   --continue anticoagulation with Xarelto  5) 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--no concerns about valve function at this time  6)DM2- Use Novolog/Humalog Sliding scale insulin with Accu-Cheks/Fingersticks as ordered     Disposition/Need for in-Hospital Stay- patient unable to be discharged at this time due to --pneumonia/sepsis with fevers leukocytosis and hypoxia requiring IV antibiotics pending further culture and clinical data*  Status is: Inpatient  Remains inpatient appropriate because:pneumonia/sepsis with fevers leukocytosis and hypoxia requiring IV antibiotics pending further culture and clinical data*   Dispo: The patient is from: ALF  Anticipated d/c is to:  [facility]  Anticipated d/c date is: 2 days  Patient currently is not medically stable to d/c. Barriers: Not Clinically Stable- pneumonia/sepsis with fevers leukocytosis and hypoxia requiring IV antibiotics pending further culture and clinical data  Code Status : full  Family Communication:   (patient is alert, awake and coherent)  Consults  :  na  DVT Prophylaxis  :  Xarelto- SCDs   Lab Results  Component Value Date   PLT 116 (L) 03/17/2020    Inpatient  Medications  Scheduled Meds: . ascorbic acid  500 mg Oral Daily  . carvedilol  3.125 mg Per Tube BID WC  . feeding supplement (ENSURE ENLIVE)   Oral BID  . ferrous sulfate  325 mg Oral BID WC  . fluticasone furoate-vilanterol  1 puff Inhalation Daily   And  . umeclidinium bromide  1 puff Inhalation Daily  . insulin aspart  0-5 Units Subcutaneous QHS  . insulin aspart  0-6 Units Subcutaneous TID WC  . magnesium oxide  400 mg Oral BID  . metoCLOPramide  5 mg Oral TID AC  . multivitamin with minerals  1 tablet Per Tube Daily  . pantoprazole sodium  40 mg Per Tube Daily  . rivaroxaban  20 mg Per Tube Q supper  . rosuvastatin  5 mg Oral Daily  . sodium chloride flush  3 mL Intravenous Q12H  . tamsulosin  0.4 mg Oral QPC breakfast  . Vilazodone HCl  40 mg Oral Daily   Continuous Infusions: . sodium chloride 150 mL/hr at 03/16/20 1653  . sodium chloride    . sodium chloride 50 mL/hr at 03/17/20 1015  . azithromycin 500 mg (03/17/20 1253)  . cefTRIAXone (ROCEPHIN)  IV 2 g (03/17/20 1016)   PRN Meds:.sodium chloride, acetaminophen, albuterol, ondansetron **OR** ondansetron (ZOFRAN) IV, oxyCODONE, polyethylene glycol, sodium chloride flush, traZODone    Anti-infectives (From admission, onward)   Start     Dose/Rate Route Frequency Ordered Stop   03/17/20 0830  cefTRIAXone (ROCEPHIN) 2 g in sodium chloride 0.9 % 100 mL IVPB        2 g 200 mL/hr over 30 Minutes Intravenous Every 24 hours 03/17/20 0813     03/16/20 1945  cefTRIAXone (ROCEPHIN) 1 g in sodium chloride 0.9 % 100 mL IVPB  Status:  Discontinued        1 g 200 mL/hr over 30 Minutes Intravenous Every 24 hours 03/16/20 1933 03/17/20 0813   03/16/20 1945  azithromycin (ZITHROMAX) 500 mg in sodium chloride 0.9 % 250 mL IVPB        500 mg 250 mL/hr over 60 Minutes Intravenous Every 24 hours 03/16/20 1933     03/16/20 1115  cefTRIAXone (ROCEPHIN) 1 g in sodium chloride 0.9 % 100 mL IVPB        1 g 200 mL/hr over 30 Minutes  Intravenous  Once 03/16/20 1103 03/16/20 1210   03/16/20 1115  azithromycin (ZITHROMAX) 500 mg in sodium chloride 0.9 % 250 mL IVPB        500 mg 250 mL/hr over 60 Minutes Intravenous  Once 03/16/20 1103 03/16/20 1326        Objective:   Vitals:   03/16/20 1930 03/16/20 1953 03/16/20 2027 03/17/20 1611  BP: (!) 133/40  (!) 122/100 (!) 108/54  Pulse: (!) 45  (!) 52 70  Resp: (!) 21  16 18   Temp:  98.1 F (36.7 C) 98.7 F (37.1 C) 98.2 F (36.8 C)  TempSrc:  Oral Oral Oral  SpO2: 94%  91% 95%  Weight:      Height:        Wt Readings from Last 3 Encounters:  03/16/20 108.9 kg  03/01/20 110.7 kg  02/17/20 102.1 kg     Intake/Output Summary (Last 24 hours) at 03/17/2020 1814 Last data filed at  03/17/2020 1523 Gross per 24 hour  Intake 1794.37 ml  Output --  Net 1794.37 ml     Physical Exam  Gen:- Awake Alert,  In no apparent distress  HEENT:- [place].AT, No sclera icterus Neck-Supple Neck,No JVD,.  Lungs-diminished in bases with scattered rhonchi CV- S1, S2 normal, regular  Abd-  +ve B.Sounds, Abd Soft, No tenderness,    Psych-affect is appropriate, oriented x3 Neurological -generalized weakness and deconditioning but no new focal deficits  extremities -chronic lower extremity pedal edema noted, intact peripheral pulses  Skin - warm, dry--right foot and sacral decubitus please see photos in epic   Data Review:   Micro Results Recent Results (from the past 240 hour(s))  Urine culture     Status: Abnormal   Collection Time: 03/16/20  7:39 AM   Specimen: Urine, Catheterized  Result Value Ref Range Status   Specimen Description   Final    URINE, CATHETERIZED Performed at [facility], [address], [place], [place] [address]    Special Requests   Final    NONE Performed at [facility], [address], [place], [place] [address]    Culture MULTIPLE SPECIES PRESENT, SUGGEST RECOLLECTION (A)  Final   Report Status 03/17/2020 FINAL  Final  SARS Coronavirus 2 by RT  PCR (hospital order, performed in [facility] hospital lab) Nasopharyngeal Nasopharyngeal Swab     Status: None   Collection Time: 03/16/20 10:03 AM   Specimen: Nasopharyngeal Swab  Result Value Ref Range Status   SARS Coronavirus 2 NEGATIVE NEGATIVE Final    Comment: (NOTE) SARS-CoV-2 target nucleic acids are NOT DETECTED.  The SARS-CoV-2 RNA is generally detectable in upper and lower respiratory specimens during the acute phase of infection. The lowest concentration of SARS-CoV-2 viral copies this assay can detect is 250 copies / mL. A negative result does not preclude SARS-CoV-2 infection and should not be used as the sole basis for treatment or other patient management decisions.  A negative result may occur with improper specimen collection / handling, submission of specimen other than nasopharyngeal swab, presence of viral mutation(s) within the areas targeted by this assay, and inadequate number of viral copies (<250 copies / mL). A negative result must be combined with clinical observations, patient history, and epidemiological information.  Fact Sheet for Patients:   BoilerBrush.com.cy  Fact Sheet for Healthcare Providers: https://pope.com/  This test is not yet approved or  cleared by the [place] FDA and has been authorized for detection and/or diagnosis of SARS-CoV-2 by FDA under an Emergency Use Authorization (EUA).  This EUA will remain in effect (meaning this test can be used) for the duration of the COVID-19 declaration under Section 564(b)(1) of the Act, 21 U.S.C. section 360bbb-3(b)(1), unless the authorization is terminated or revoked sooner.  Performed at [facility], [address], [place], [place] [address]   Blood Culture (routine x 2)     Status: None (Preliminary result)   Collection Time: 03/16/20 10:22 AM   Specimen: BLOOD  Result Value Ref Range Status   Specimen Description   Final    BLOOD LEFT  ANTECUBITAL Performed at [facility], [address], [place], [place] [address]    Special Requests   Final    BOTTLES DRAWN AEROBIC ONLY Blood Culture adequate volume Performed at [facility], [address], [place], [place] [address]    Culture  Setup Time   Final    AEROBIC BOTTLE ONLY GRAM POSITIVE COCCI IN CHAINS Gram Stain Report Called  to,Read Back By and Verified With: C THOMAS,RN@[id number]  03/17/20 MKELLY Organism ID to follow CRITICAL RESULT CALLED TO, READ BACK BY AND VERIFIED WITH: T MAYO RN 03/17/20 0618 JDW Performed at [facility], [address], [place], [place] [address]    Culture GRAM POSITIVE COCCI  Final   Report Status PENDING  Incomplete  Blood Culture (routine x 2)     Status: None (Preliminary result)   Collection Time: 03/16/20 10:22 AM   Specimen: BLOOD  Result Value Ref Range Status   Specimen Description BLOOD LEFT HAND  Final   Special Requests   Final    BOTTLES DRAWN AEROBIC ONLY Blood Culture adequate volume   Culture   Final    NO GROWTH 1 DAY Performed at [facility], [address], [place], [place] [address]    Report Status PENDING  Incomplete  Blood Culture ID Panel (Reflexed)     Status: Abnormal   Collection Time: 03/16/20 10:22 AM  Result Value Ref Range Status   Enterococcus faecalis NOT DETECTED NOT DETECTED Final   Enterococcus Faecium NOT DETECTED NOT DETECTED Final   Listeria monocytogenes NOT DETECTED NOT DETECTED Final   Staphylococcus species NOT DETECTED NOT DETECTED Final   Staphylococcus aureus (BCID) NOT DETECTED NOT DETECTED Final   Staphylococcus epidermidis NOT DETECTED NOT DETECTED Final   Staphylococcus lugdunensis NOT DETECTED NOT DETECTED Final   Streptococcus species DETECTED (A) NOT DETECTED Final    Comment: Not Enterococcus species, Streptococcus agalactiae, Streptococcus pyogenes, or Streptococcus pneumoniae. CRITICAL RESULT CALLED TO, READ BACK BY AND VERIFIED WITH: T MAYO RN 03/17/20 0618 JDW     Streptococcus agalactiae NOT DETECTED NOT DETECTED Final   Streptococcus pneumoniae NOT DETECTED NOT DETECTED Final   Streptococcus pyogenes NOT DETECTED NOT DETECTED Final   A.calcoaceticus-baumannii NOT DETECTED NOT DETECTED Final   Bacteroides fragilis NOT DETECTED NOT DETECTED Final   Enterobacterales NOT DETECTED NOT DETECTED Final   Enterobacter cloacae complex NOT DETECTED NOT DETECTED Final   Escherichia coli NOT DETECTED NOT DETECTED Final   Klebsiella aerogenes NOT DETECTED NOT DETECTED Final   Klebsiella oxytoca NOT DETECTED NOT DETECTED Final   Klebsiella pneumoniae NOT DETECTED NOT DETECTED Final   Proteus species NOT DETECTED NOT DETECTED Final   Salmonella species NOT DETECTED NOT DETECTED Final   Serratia marcescens NOT DETECTED NOT DETECTED Final   Haemophilus influenzae NOT DETECTED NOT DETECTED Final   Neisseria meningitidis NOT DETECTED NOT DETECTED Final   Pseudomonas aeruginosa NOT DETECTED NOT DETECTED Final   Stenotrophomonas maltophilia NOT DETECTED NOT DETECTED Final   Candida albicans NOT DETECTED NOT DETECTED Final   Candida auris NOT DETECTED NOT DETECTED Final   Candida glabrata NOT DETECTED NOT DETECTED Final   Candida krusei NOT DETECTED NOT DETECTED Final   Candida parapsilosis NOT DETECTED NOT DETECTED Final   Candida tropicalis NOT DETECTED NOT DETECTED Final   Cryptococcus neoformans/gattii NOT DETECTED NOT DETECTED Final    Comment: Performed at [facility], [address] [place], [place] [address]  MRSA PCR Screening     Status: None   Collection Time: 03/16/20  8:44 PM   Specimen: Nasopharyngeal  Result Value Ref Range Status   MRSA by PCR NEGATIVE NEGATIVE Final    Comment:        The GeneXpert MRSA Assay (FDA approved for NASAL specimens only), is one component of a comprehensive MRSA colonization surveillance program. It is not intended to diagnose MRSA infection nor to guide or monitor  treatment for MRSA  infections. Performed at [facility], [address] [place], [place] [address]     Radiology Reports DG Chest [place] 1 View  Result Date: 03/16/2020 CLINICAL DATA:  Concern for sepsis. EXAM: PORTABLE CHEST 1 VIEW COMPARISON:  06/17/2019 chest radiograph and prior. FINDINGS: Left basilar/retrocardiac patchy opacities. No pneumothorax. Small left pleural effusion. Cardiomegaly and prominence of the central pulmonary vessels. Post sternotomy sequela. IMPRESSION: Cardiomegaly and central pulmonary vascular congestion. Left basilar opacities. Differential includes infection, edema and atelectasis. Electronically Signed   By: Stana Bunting M.D.   On: 03/16/2020 08:14     CBC Recent Labs  Lab 03/16/20 1022 03/17/20 0449  WBC 16.7* 9.9  HGB 11.7* 11.2*  HCT 38.8* 37.6*  PLT 148* 116*  MCV 96.0 98.4  MCH 29.0 29.3  MCHC 30.2 29.8*  RDW 17.1* 17.3*  LYMPHSABS 0.5*  --   MONOABS 1.2*  --   EOSABS 0.0  --   BASOSABS 0.0  --     Chemistries  Recent Labs  Lab 03/16/20 1022 03/17/20 0449  NA 141 141  K 4.2 4.0  CL 99 104  CO2 32 30  GLUCOSE 120* 82  BUN 24* 25*  CREATININE 1.04 0.87  CALCIUM 8.6* 8.3*  AST 15  --   ALT 10  --   ALKPHOS 75  --   BILITOT 0.9  --    ------------------------------------------------------------------------------------------------------------------ No results for input(s): CHOL, HDL, LDLCALC, TRIG, CHOLHDL, LDLDIRECT in the last 72 hours.  Lab Results  Component Value Date   HGBA1C 5.6 03/01/2020   ------------------------------------------------------------------------------------------------------------------ No results for input(s): TSH, T4TOTAL, T3FREE, THYROIDAB in the last 72 hours.  Invalid input(s): FREET3 ------------------------------------------------------------------------------------------------------------------ No results for input(s): VITAMINB12, FOLATE, FERRITIN, TIBC, IRON, RETICCTPCT in the last 72 hours.  Coagulation  profile Recent Labs  Lab 03/16/20 1022  INR 2.3*    No results for input(s): DDIMER in the last 72 hours.  Cardiac Enzymes No results for input(s): CKMB, TROPONINI, MYOGLOBIN in the last 168 hours.  Invalid input(s): CK ------------------------------------------------------------------------------------------------------------------    Component Value Date/Time   BNP 368.9 (H) 06/03/2019 2149     Shon Hale M.D on 03/17/2020 at 6:14 PM  Go to [email or web address] - for contact info  [facility] - Office  [phone]

## 2020-03-18 ENCOUNTER — Inpatient Hospital Stay (HOSPITAL_COMMUNITY): Payer: Medicare Other

## 2020-03-18 DIAGNOSIS — R7881 Bacteremia: Secondary | ICD-10-CM

## 2020-03-18 LAB — ECHOCARDIOGRAM COMPLETE
Area-P 1/2: 2.16 cm2
Height: 69 in
S' Lateral: 4.46 cm
Weight: 3840 oz

## 2020-03-18 LAB — GLUCOSE, CAPILLARY
Glucose-Capillary: 164 mg/dL — ABNORMAL HIGH (ref 70–99)
Glucose-Capillary: 168 mg/dL — ABNORMAL HIGH (ref 70–99)
Glucose-Capillary: 162 mg/dL — ABNORMAL HIGH (ref 70–99)
Glucose-Capillary: 155 mg/dL — ABNORMAL HIGH (ref 70–99)

## 2020-03-18 MED ORDER — PERFLUTREN LIPID MICROSPHERE
1.0000 mL | INTRAVENOUS | Status: AC | PRN
  Administered 2020-03-18: 3 mL via INTRAVENOUS
  Filled 2020-03-18: qty 10

## 2020-03-18 MED ORDER — GERHARDT'S BUTT CREAM
TOPICAL_CREAM | Freq: Four times a day (QID) | CUTANEOUS | Status: DC
  Administered 2020-03-18 – 2020-03-29 (×7): 1 via TOPICAL
  Filled 2020-03-18 (×2): qty 1

## 2020-03-18 NOTE — Consult Note (Signed)
WOC Nurse Consult Note: Reason for Consult:POA wounds on bilateral buttocks (partial thickness) and right foot (Plantar and right heel, Stage 3). Photos on EMR appreciated. Wound type:Moissture, friction, pressure Pressure Injury POA: Yes Measurement:To be obtained by bedside RN with first dressing change and documented on Nursing Flow Sheet. Wound bed: Buttocks are discolored purple/brown and may be associated with chronic friction injury vs DTPI. Partial thickness skin loss noted with bright red, moist wound bed, scant serous exudate. Right foot, Plantar aspect at forefoot with circular callous, neuropathic etiology. Dry, no exudate. Right heel with Stage 3 pressure injury. Red, moist wound bed.Small amount serous drainage. Drainage (amount, consistency, odor) As noted above Periwound:Very dry, intact Dressing procedure/placement/frequency: I have provided Nursing with guidance for the care of the patient's wounds via the orders. A mattress replacement with low air loss feature has been ordered. Bilateral Pressure redistribution heel boots are provided. Topical care to the buttocks will be with Gerhart's Butt Cream, a compounded prescriptive of 1:1:1 hydrocortisone cream, lotrimin cream and zinc oxide. A pressure redistribution chair pad is provided for use when OOB in chair. Turning and repositioning while in bed and minimization of the supine position is a cornerstone of this care plan.  WOC nursing team will not follow, but will remain available to this patient, the nursing and medical teams.  Please re-consult if needed. Thanks, Ladona Mow, MSN, RN, GNP, [place]  Pager# [phone]

## 2020-03-18 NOTE — Progress Notes (Addendum)
Patient Demographics:    Cody Hale, is a 69 y.o. male, DOB - 1951/03/30, WUJ:[id number]  Admit date - 03/16/2020   Admitting Physician Cody Yeung Mariea Clonts, MD  Outpatient Primary MD for the patient is Cody Grandchild, MD  LOS - 2  Chief Complaint  Patient presents with  . Fever        Subjective:    Cody Hale today has  no emesis,  No chest pain,   -Cough and shortness of breath is not worse Appears fatigued  Assessment  & Plan :    Principal Problem:   Sepsis due to pneumonia Active Problems:   PNA (pneumonia)   OSA (obstructive sleep apnea)   Essential hypertension, malignant   Type II diabetes mellitus with manifestations (HCC)   Chronic systolic CHF (congestive heart failure) (HCC)   COPD (chronic obstructive pulmonary disease) with chronic bronchitis (HCC)   Chronic atrial fibrillation   S/P mitral valve replacement with bioprosthetic valve   Major depressive disorder, recurrent severe without psychotic features (HCC)   Chronic respiratory failure with hypoxia (HCC)  Brief Summary:- 69 y.o. 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, chronic atrial fibrillation with chronic anticoagulation, COPD, diabetes mellitus , h/o T11/L4 fusion after lumbar fracture 11/17/20withWound dehiscence noted12/7/20 Prior history of dysphagia requiring PEG tube placement 06/04/2019 chronic hypoxic respiratory failure admitted on 03/17/2019 from [facility] with acute on chronic hypoxic respiratory failure secondary to community-acquired pneumonia  A/p 1)Community-acquired Pneumonia---  --In the ED chest x-ray was suggestive of pulmonary venous congestion versus pneumonia -However given fevers, leukocytosis cough and dyspnea and hypoxia favor pneumonia -Continue IV Rocephin and azithromycin,  bronchodilators and mucolytics -Repeat chest x-ray on 03/18/2020   2)Sepsis secondary to #1 above--- patient had leukocytosis, tachypnea, tachycardia and fevers --- as well as confusion and hypoxia -- suspect secondary to #1 above -UA not suggestive of UTI -Patient's right foot wound and sacral wounds do not look particularly infected--please see photos in epic --Lactic acid is not elevated -Antibiotics as above #1 blood cultures from 03/16/20 with strep group G,--patient with prior history of endocarditis , repeat TTE from 03/18/2020 noted -We will repeat blood cultures on 03/19/2020 -WBC is down to 9.9 from 16.7  3) chronic anemia----hemoglobin currently above 11 which is higher than recent baseline, -No bleeding concerns at this time  4) chronic atrial fibrillation   --continue anticoagulation with Xarelto  5) 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 No evidence of mitral valve regurgitation. Moderate to severe mitral stenosis. The mean mitral valve gradient is 11.7 mmHg at 89bpm. There is a 29 mm St. Jude tissue valve present in the mitral position.    6)DM2- Use Novolog/Humalog Sliding scale insulin with Accu-Cheks/Fingersticks as ordered   7) decubitus ulcers--mostly on the buttocks and right foot----wound care consult appreciated, recommendations noted  Disposition/Need for in-Hospital Stay- patient unable to be discharged at this time due to --pneumonia/sepsis with fevers leukocytosis  requiring IV antibiotics pending further culture and clinical data*  Status is: Inpatient  Remains inpatient appropriate because:pneumonia/sepsis with fevers leukocytosis requiring IV antibiotics pending further culture and clinical data*  Dispo: The patient is from: ALF  Anticipated d/c is to:  [facility]  Anticipated d/c date is: 2 days  Patient  currently is not medically stable to d/c. Barriers: Not Clinically Stable- pneumonia/sepsis with fevers leukocytosis  requiring IV antibiotics pending further culture and clinical data  Code Status : full  Family Communication:   (patient is alert, awake and coherent) Left voicemail for son Cody Hale [phone] Consults  :  na  DVT Prophylaxis  :  Xarelto- SCDs   Lab Results  Component Value Date   PLT 116 (L) 03/17/2020    Inpatient Medications  Scheduled Meds: . ascorbic acid  500 mg Oral Daily  . carvedilol  3.125 mg Per Tube BID WC  . feeding supplement (ENSURE ENLIVE)   Oral BID  . ferrous sulfate  325 mg Oral BID WC  . fluticasone furoate-vilanterol  1 puff Inhalation Daily   And  . [START ON 03/19/2020] umeclidinium bromide  1 puff Inhalation Daily  . Gerhardt's butt cream   Topical QID  . insulin aspart  0-5 Units Subcutaneous QHS  . insulin aspart  0-6 Units Subcutaneous TID WC  . magnesium oxide  400 mg Oral BID  . metoCLOPramide  5 mg Oral TID AC  . multivitamin with minerals  1 tablet Per Tube Daily  . pantoprazole sodium  40 mg Per Tube Daily  . rivaroxaban  20 mg Per Tube Q supper  . rosuvastatin  5 mg Oral Daily  . sodium chloride flush  3 mL Intravenous Q12H  . tamsulosin  0.4 mg Oral QPC breakfast  . Vilazodone HCl  40 mg Oral Daily   Continuous Infusions: . sodium chloride    . sodium chloride 50 mL/hr at 03/18/20 0627  . azithromycin 500 mg (03/18/20 1300)  . cefTRIAXone (ROCEPHIN)  IV 2 g (03/18/20 0838)   PRN Meds:.sodium chloride, acetaminophen, albuterol, ondansetron **OR** ondansetron (ZOFRAN) IV, oxyCODONE, polyethylene glycol, sodium chloride flush, traZODone    Anti-infectives (From admission, onward)   Start     Dose/Rate Route Frequency Ordered Stop   03/17/20 0830  cefTRIAXone (ROCEPHIN) 2 g in sodium chloride 0.9 % 100 mL IVPB        2 g 200 mL/hr over 30 Minutes Intravenous Every 24 hours 03/17/20 0813     03/16/20 1945  cefTRIAXone  (ROCEPHIN) 1 g in sodium chloride 0.9 % 100 mL IVPB  Status:  Discontinued        1 g 200 mL/hr over 30 Minutes Intravenous Every 24 hours 03/16/20 1933 03/17/20 0813   03/16/20 1945  azithromycin (ZITHROMAX) 500 mg in sodium chloride 0.9 % 250 mL IVPB        500 mg 250 mL/hr over 60 Minutes Intravenous Every 24 hours 03/16/20 1933     03/16/20 1115  cefTRIAXone (ROCEPHIN) 1 g in sodium chloride 0.9 % 100 mL IVPB        1 g 200 mL/hr over 30 Minutes Intravenous  Once 03/16/20 1103 03/16/20 1210   03/16/20 1115  azithromycin (ZITHROMAX) 500 mg in sodium chloride 0.9 % 250 mL IVPB        500 mg 250 mL/hr over 60 Minutes Intravenous  Once 03/16/20 1103 03/16/20 1326        Objective:   Vitals:   03/18/20 0628 03/18/20 0814 03/18/20 0835 03/18/20 1414  BP: (!) 102/46  (!) 112/53 (!) 114/59  Pulse: (!) 109  (!) 101 63  Resp: 15  15 18   Temp:  98.9 F (37.2 C)   98.9 F (37.2 C)  TempSrc: Oral     SpO2: 93% 91% 91% 95%  Weight:      Height:        Wt Readings from Last 3 Encounters:  03/16/20 108.9 kg  03/01/20 110.7 kg  02/17/20 102.1 kg     Intake/Output Summary (Last 24 hours) at 03/18/2020 1804 Last data filed at 03/18/2020 1300 Gross per 24 hour  Intake 826.63 ml  Output --  Net 826.63 ml    Physical Exam  Gen:- Awake Alert,  In no apparent distress  HEENT:- [place].AT, No sclera icterus Neck-Supple Neck,No JVD,.  Lungs-diminished in bases with scattered rhonchi CV- S1, S2 normal, regular  Abd-  +ve B.Sounds, Abd Soft, No tenderness,    Psych-affect is appropriate, oriented x3 Neurological -generalized weakness and deconditioning but no new focal deficits  extremities -chronic lower extremity pedal edema noted, intact peripheral pulses  Skin - warm, dry--right foot and sacral decubitus please see photos in epic   Data Review:   Micro Results Recent Results (from the past 240 hour(s))  Urine culture     Status: Abnormal   Collection Time: 03/16/20  7:39 AM    Specimen: Urine, Catheterized  Result Value Ref Range Status   Specimen Description   Final    URINE, CATHETERIZED Performed at [facility], [address] [place], [place] [address]    Special Requests   Final    NONE Performed at [facility], [address], [place], [place] [address]    Culture MULTIPLE SPECIES PRESENT, SUGGEST RECOLLECTION (A)  Final   Report Status 03/17/2020 FINAL  Final  SARS Coronavirus 2 by RT PCR (hospital order, performed in [facility] hospital lab) Nasopharyngeal Nasopharyngeal Swab     Status: None   Collection Time: 03/16/20 10:03 AM   Specimen: Nasopharyngeal Swab  Result Value Ref Range Status   SARS Coronavirus 2 NEGATIVE NEGATIVE Final    Comment: (NOTE) SARS-CoV-2 target nucleic acids are NOT DETECTED.  The SARS-CoV-2 RNA is generally detectable in upper and lower respiratory specimens during the acute phase of infection. The lowest concentration of SARS-CoV-2 viral copies this assay can detect is 250 copies / mL. A negative result does not preclude SARS-CoV-2 infection and should not be used as the sole basis for treatment or other patient management decisions.  A negative result may occur with improper specimen collection / handling, submission of specimen other than nasopharyngeal swab, presence of viral mutation(s) within the areas targeted by this assay, and inadequate number of viral copies (<250 copies / mL). A negative result must be combined with clinical observations, patient history, and epidemiological information.  Fact Sheet for Patients:   BoilerBrush.com.cy  Fact Sheet for Healthcare Providers: https://pope.com/  This test is not yet approved or  cleared by the [place] FDA and has been authorized for detection and/or diagnosis of SARS-CoV-2 by FDA under an Emergency Use Authorization (EUA).  This EUA will remain in effect (meaning this test can be used) for the duration  of the COVID-19 declaration under Section 564(b)(1) of the Act, 21 U.S.C. section 360bbb-3(b)(1), unless the authorization is terminated or revoked sooner.  Performed at [facility], [address] [place], [place] [address]   Blood Culture (routine x 2)     Status: Abnormal (Preliminary result)   Collection Time: 03/16/20 10:22 AM   Specimen: BLOOD  Result Value Ref Range Status   Specimen Description   Final  BLOOD LEFT ANTECUBITAL Performed at [facility], [address], [place], [place] [address]    Special Requests   Final    BOTTLES DRAWN AEROBIC ONLY Blood Culture adequate volume Performed at [facility], [address] [place], [place] [address]    Culture  Setup Time   Final    AEROBIC BOTTLE ONLY GRAM POSITIVE COCCI IN CHAINS Gram Stain Report Called to,Read Back By and Verified With: C THOMAS,RN@[id number]  03/17/20 MKELLY Organism ID to follow CRITICAL RESULT CALLED TO, READ BACK BY AND VERIFIED WITH: T MAYO RN 03/17/20 0618 JDW    Culture (A)  Final    STREPTOCOCCUS GROUP G SUSCEPTIBILITIES TO FOLLOW Performed at [facility], [address], [place], [place] [address]    Report Status PENDING  Incomplete  Blood Culture (routine x 2)     Status: None (Preliminary result)   Collection Time: 03/16/20 10:22 AM   Specimen: BLOOD  Result Value Ref Range Status   Specimen Description BLOOD LEFT HAND  Final   Special Requests   Final    BOTTLES DRAWN AEROBIC ONLY Blood Culture adequate volume   Culture   Final    NO GROWTH 2 DAYS Performed at [facility], [address] [place], [place] [address]    Report Status PENDING  Incomplete  Blood Culture ID Panel (Reflexed)     Status: Abnormal   Collection Time: 03/16/20 10:22 AM  Result Value Ref Range Status   Enterococcus faecalis NOT DETECTED NOT DETECTED Final   Enterococcus Faecium NOT DETECTED NOT DETECTED Final   Listeria monocytogenes NOT DETECTED NOT DETECTED Final   Staphylococcus species NOT DETECTED  NOT DETECTED Final   Staphylococcus aureus (BCID) NOT DETECTED NOT DETECTED Final   Staphylococcus epidermidis NOT DETECTED NOT DETECTED Final   Staphylococcus lugdunensis NOT DETECTED NOT DETECTED Final   Streptococcus species DETECTED (A) NOT DETECTED Final    Comment: Not Enterococcus species, Streptococcus agalactiae, Streptococcus pyogenes, or Streptococcus pneumoniae. CRITICAL RESULT CALLED TO, READ BACK BY AND VERIFIED WITH: T MAYO RN 03/17/20 0618 JDW    Streptococcus agalactiae NOT DETECTED NOT DETECTED Final   Streptococcus pneumoniae NOT DETECTED NOT DETECTED Final   Streptococcus pyogenes NOT DETECTED NOT DETECTED Final   A.calcoaceticus-baumannii NOT DETECTED NOT DETECTED Final   Bacteroides fragilis NOT DETECTED NOT DETECTED Final   Enterobacterales NOT DETECTED NOT DETECTED Final   Enterobacter cloacae complex NOT DETECTED NOT DETECTED Final   Escherichia coli NOT DETECTED NOT DETECTED Final   Klebsiella aerogenes NOT DETECTED NOT DETECTED Final   Klebsiella oxytoca NOT DETECTED NOT DETECTED Final   Klebsiella pneumoniae NOT DETECTED NOT DETECTED Final   Proteus species NOT DETECTED NOT DETECTED Final   Salmonella species NOT DETECTED NOT DETECTED Final   Serratia marcescens NOT DETECTED NOT DETECTED Final   Haemophilus influenzae NOT DETECTED NOT DETECTED Final   Neisseria meningitidis NOT DETECTED NOT DETECTED Final   Pseudomonas aeruginosa NOT DETECTED NOT DETECTED Final   Stenotrophomonas maltophilia NOT DETECTED NOT DETECTED Final   Candida albicans NOT DETECTED NOT DETECTED Final   Candida auris NOT DETECTED NOT DETECTED Final   Candida glabrata NOT DETECTED NOT DETECTED Final   Candida krusei NOT DETECTED NOT DETECTED Final   Candida parapsilosis NOT DETECTED NOT DETECTED Final   Candida tropicalis NOT DETECTED NOT DETECTED Final   Cryptococcus neoformans/gattii NOT DETECTED NOT DETECTED Final    Comment: Performed at [facility], [address],  [place], [place] [address]  MRSA  PCR Screening     Status: None   Collection Time: 03/16/20  8:44 PM   Specimen: Nasopharyngeal  Result Value Ref Range Status   MRSA by PCR NEGATIVE NEGATIVE Final    Comment:        The GeneXpert MRSA Assay (FDA approved for NASAL specimens only), is one component of a comprehensive MRSA colonization surveillance program. It is not intended to diagnose MRSA infection nor to guide or monitor treatment for MRSA infections. Performed at [facility], [address], [place], [place] [address]     Radiology Reports DG Chest [place] 1 View  Result Date: 03/16/2020 CLINICAL DATA:  Concern for sepsis. EXAM: PORTABLE CHEST 1 VIEW COMPARISON:  06/17/2019 chest radiograph and prior. FINDINGS: Left basilar/retrocardiac patchy opacities. No pneumothorax. Small left pleural effusion. Cardiomegaly and prominence of the central pulmonary vessels. Post sternotomy sequela. IMPRESSION: Cardiomegaly and central pulmonary vascular congestion. Left basilar opacities. Differential includes infection, edema and atelectasis. Electronically Signed   By: Stana Bunting M.D.   On: 03/16/2020 08:14   ECHOCARDIOGRAM COMPLETE  Result Date: 03/18/2020    ECHOCARDIOGRAM REPORT   Patient Name:   OSSIE BELTRAN Date of Exam: 03/18/2020 Medical Rec #:  [id number]    Height:       69.0 in Accession #:    [id number]   Weight:       240.0 lb Date of Birth:  [birth year]    BSA:          2.232 m Patient Age:    69 years     BP:           110/68 mmHg Patient Gender: M            HR:           67 bpm. [facility] Procedure: 2D Echo and Intracardiac Opacification Agent Indications:    Bacteremia R78.81  History:        Patient has prior history of Echocardiogram examinations, most                 recent 05/20/2019. CHF, COPD, Arrythmias:Atrial Fibrillation;                 Risk Factors:Hypertension and Diabetes.                  Mitral Valve: 29 mm St. Jude tissue valve valve is present in                  the mitral position. Procedure Date: 09/07/2012.  Sonographer:    Thurman Coyer RDCS (AE) Referring Phys: [id number] Simcha Speir IMPRESSIONS  1. Left ventricular ejection fraction, by estimation, is 35 to 40%. The left ventricle has moderately decreased function. The left ventricle demonstrates global hypokinesis. There is mild concentric left ventricular hypertrophy. Left ventricular diastolic parameters are indeterminate.  2. Right ventricular systolic function is normal. The right ventricular size is normal. There is moderately elevated pulmonary artery systolic pressure. The estimated right ventricular systolic pressure is 54.9 mmHg.  3. Left atrial size was severely dilated.  4. The mitral valve has been repaired/replaced. No evidence of mitral valve regurgitation. Moderate to severe mitral stenosis. The mean mitral valve gradient is 11.7 mmHg at 89bpm. There is a 29 mm St. Jude tissue valve present in the mitral position. Procedure Date: 09/07/2012. Echo findings are consistent with normal structure and function of the mitral valve prosthesis.  5. The aortic valve is tricuspid. Aortic valve regurgitation is  not visualized. Mild aortic valve sclerosis is present, with no evidence of aortic valve stenosis.  6. The inferior vena cava is dilated in size with <50% respiratory variability, suggesting right atrial pressure of 15 mmHg.  7. Compared to prior study 05/2018, the mean MV gradient of the MV tissue valve has increased from 8 to . FINDINGS  Left Ventricle: Left ventricular ejection fraction, by estimation, is 35 to 40%. The left ventricle has moderately decreased function. The left ventricle demonstrates global hypokinesis. Definity contrast agent was given IV to delineate the left ventricular endocardial borders. The left ventricular internal cavity size was normal in size. There is mild concentric left ventricular hypertrophy. Left ventricular diastolic parameters are indeterminate. Right  Ventricle: The right ventricular size is normal. No increase in right ventricular wall thickness. Right ventricular systolic function is normal. There is moderately elevated pulmonary artery systolic pressure. The tricuspid regurgitant velocity is 3.16 m/s, and with an assumed right atrial pressure of 15 mmHg, the estimated right ventricular systolic pressure is 54.9 mmHg. Left Atrium: Left atrial size was severely dilated. Right Atrium: Right atrial size was normal in size. Pericardium: There is no evidence of pericardial effusion. Mitral Valve: The mitral valve has been repaired/replaced. No evidence of mitral valve regurgitation. There is a 29 mm St. Jude tissue valve present in the mitral position. Procedure Date: 09/07/2012. Echo findings are consistent with normal structure and function of the mitral valve prosthesis. Moderate to severe mitral valve stenosis. MV peak gradient, 28.7 mmHg. The mean mitral valve gradient is 11.7 mmHg. Tricuspid Valve: The tricuspid valve is normal in structure. Tricuspid valve regurgitation is mild . No evidence of tricuspid stenosis. Aortic Valve: The aortic valve is tricuspid. Aortic valve regurgitation is not visualized. Mild aortic valve sclerosis is present, with no evidence of aortic valve stenosis. Pulmonic Valve: The pulmonic valve was normal in structure. Pulmonic valve regurgitation is trivial. No evidence of pulmonic stenosis. Aorta: The aortic root is normal in size and structure. Venous: The inferior vena cava is dilated in size with less than 50% respiratory variability, suggesting right atrial pressure of 15 mmHg. IAS/Shunts: No atrial level shunt detected by color flow Doppler.  LEFT VENTRICLE PLAX 2D LVIDd:         5.42 cm LVIDs:         4.46 cm LV PW:         1.22 cm LV IVS:        1.30 cm LVOT diam:     2.50 cm LV SV:         59 LV SV Index:   26 LVOT Area:     4.91 cm  RIGHT VENTRICLE TAPSE (M-mode): 1.0 cm LEFT ATRIUM              Index       RIGHT ATRIUM            Index LA diam:        5.10 cm  2.28 cm/m  RA Area:     20.30 cm LA Vol (A2C):   121.0 ml 54.20 ml/m RA Volume:   59.20 ml  26.52 ml/m LA Vol (A4C):   114.0 ml 51.06 ml/m LA Biplane Vol: 123.0 ml 55.10 ml/m  AORTIC VALVE LVOT Vmax:   72.10 cm/s LVOT Vmean:  48.067 cm/s LVOT VTI:    0.120 m  AORTA Ao Root diam: 3.60 cm MITRAL VALVE              TRICUSPID  VALVE MV Area (PHT): 2.16 cm   TR Peak grad:   39.9 mmHg MV Peak grad:  28.7 mmHg  TR Vmax:        316.00 cm/s MV Mean grad:  11.7 mmHg MV Vmax:       2.68 m/s   SHUNTS MV Vmean:      159.3 cm/s Systemic VTI:  0.12 m                           Systemic Diam: 2.50 cm Armanda Magic MD Electronically signed by Armanda Magic MD Signature Date/Time: 03/18/2020/4:27:42 PM    Final      CBC Recent Labs  Lab 03/16/20 1022 03/17/20 0449  WBC 16.7* 9.9  HGB 11.7* 11.2*  HCT 38.8* 37.6*  PLT 148* 116*  MCV 96.0 98.4  MCH 29.0 29.3  MCHC 30.2 29.8*  RDW 17.1* 17.3*  LYMPHSABS 0.5*  --   MONOABS 1.2*  --   EOSABS 0.0  --   BASOSABS 0.0  --     Chemistries  Recent Labs  Lab 03/16/20 1022 03/17/20 0449  NA 141 141  K 4.2 4.0  CL 99 104  CO2 32 30  GLUCOSE 120* 82  BUN 24* 25*  CREATININE 1.04 0.87  CALCIUM 8.6* 8.3*  AST 15  --   ALT 10  --   ALKPHOS 75  --   BILITOT 0.9  --    ------------------------------------------------------------------------------------------------------------------ No results for input(s): CHOL, HDL, LDLCALC, TRIG, CHOLHDL, LDLDIRECT in the last 72 hours.  Lab Results  Component Value Date   HGBA1C 5.6 03/01/2020   ------------------------------------------------------------------------------------------------------------------ No results for input(s): TSH, T4TOTAL, T3FREE, THYROIDAB in the last 72 hours.  Invalid input(s): FREET3 ------------------------------------------------------------------------------------------------------------------ No results for input(s): VITAMINB12, FOLATE, FERRITIN,  TIBC, IRON, RETICCTPCT in the last 72 hours.  Coagulation profile Recent Labs  Lab 03/16/20 1022  INR 2.3*    No results for input(s): DDIMER in the last 72 hours.  Cardiac Enzymes No results for input(s): CKMB, TROPONINI, MYOGLOBIN in the last 168 hours.  Invalid input(s): CK ------------------------------------------------------------------------------------------------------------------    Component Value Date/Time   BNP 368.9 (H) 06/03/2019 2149     Shon Hale M.D on 03/18/2020 at 6:04 PM  Go to [email or web address] - for contact info  [facility] - Office  [phone]

## 2020-03-18 NOTE — Progress Notes (Signed)
  Echocardiogram 2D Echocardiogram has been performed.  Tye Savoy 03/18/2020, 12:30 PM

## 2020-03-19 ENCOUNTER — Inpatient Hospital Stay (HOSPITAL_COMMUNITY): Payer: Medicare Other

## 2020-03-19 DIAGNOSIS — B954 Other streptococcus as the cause of diseases classified elsewhere: Secondary | ICD-10-CM | POA: Diagnosis present

## 2020-03-19 DIAGNOSIS — F332 Major depressive disorder, recurrent severe without psychotic features: Secondary | ICD-10-CM

## 2020-03-19 LAB — CBC
HCT: 38 % — ABNORMAL LOW (ref 39.0–52.0)
Hemoglobin: 11.3 g/dL — ABNORMAL LOW (ref 13.0–17.0)
MCH: 28.9 pg (ref 26.0–34.0)
MCHC: 29.7 g/dL — ABNORMAL LOW (ref 30.0–36.0)
MCV: 97.2 fL (ref 80.0–100.0)
Platelets: 108 10*3/uL — ABNORMAL LOW (ref 150–400)
RBC: 3.91 MIL/uL — ABNORMAL LOW (ref 4.22–5.81)
RDW: 16.5 % — ABNORMAL HIGH (ref 11.5–15.5)
WBC: 6 10*3/uL (ref 4.0–10.5)
nRBC: 0 % (ref 0.0–0.2)

## 2020-03-19 LAB — BASIC METABOLIC PANEL
Anion gap: 7 (ref 5–15)
BUN: 30 mg/dL — ABNORMAL HIGH (ref 8–23)
CO2: 29 mmol/L (ref 22–32)
Calcium: 8.3 mg/dL — ABNORMAL LOW (ref 8.9–10.3)
Chloride: 100 mmol/L (ref 98–111)
Creatinine, Ser: 0.72 mg/dL (ref 0.61–1.24)
GFR calc Af Amer: >60 mL/min (ref >60)
GFR calc non Af Amer: >60 mL/min (ref >60)
Glucose, Bld: 156 mg/dL — ABNORMAL HIGH (ref 70–99)
Potassium: 4.4 mmol/L (ref 3.5–5.1)
Sodium: 136 mmol/L (ref 135–145)

## 2020-03-19 LAB — CULTURE, BLOOD (ROUTINE X 2): Special Requests: ADEQUATE

## 2020-03-19 LAB — GLUCOSE, CAPILLARY
Glucose-Capillary: 156 mg/dL — ABNORMAL HIGH (ref 70–99)
Glucose-Capillary: 137 mg/dL — ABNORMAL HIGH (ref 70–99)
Glucose-Capillary: 150 mg/dL — ABNORMAL HIGH (ref 70–99)
Glucose-Capillary: 143 mg/dL — ABNORMAL HIGH (ref 70–99)

## 2020-03-19 MED ORDER — PANTOPRAZOLE SODIUM 40 MG PO TBEC
40.0000 mg | DELAYED_RELEASE_TABLET | Freq: Every day | ORAL | Status: DC
  Administered 2020-03-20 – 2020-03-21 (×2): 40 mg via ORAL
  Filled 2020-03-19 (×2): qty 1

## 2020-03-19 NOTE — Progress Notes (Signed)
-   Patient with increased shortness of breath and persistent hypoxia --He is reevaluated at bedside   CXR 03/19/20 -IMPRESSION: Patchy RIGHT lung infiltrates with basilar pleural effusion and atelectasis.  Opacified LEFT hemithorax by combination of atelectasis and pleural effusion, underlying infiltrate not excluded.  --Diagnostic and therapeutic left-sided thoracentesis in a.m. on 03/20/2020  Shon Hale, MD

## 2020-03-19 NOTE — Progress Notes (Addendum)
Patient Demographics:    Cody Hale, is a 69 y.o. male, DOB - 1951-03-01, IFO:[id number]  Admit date - 03/16/2020   Admitting Physician Briley Bumgarner Mariea Clonts, MD  Outpatient Primary MD for the patient is Cody Grandchild, MD  LOS - 3  Chief Complaint  Patient presents with  . Fever        Subjective:    Cody Hale today has  no emesis,  No chest pain,  -Cough and shortness of breath persist -Requiring 3 to 4 L of oxygen via nasal cannula --no fever  Or chills  --   Assessment  & Plan :    Principal Problem:   Sepsis due to pneumonia Active Problems:   PNA (pneumonia)   Streptococcus G Bacteremia   OSA (obstructive sleep apnea)   Essential hypertension, malignant   Type II diabetes mellitus with manifestations (HCC)   Chronic systolic CHF (congestive heart failure) (HCC)   COPD (chronic obstructive pulmonary disease) with chronic bronchitis (HCC)   Chronic atrial fibrillation   S/P mitral valve replacement with bioprosthetic valve   Major depressive disorder, recurrent severe without psychotic features (HCC)   Chronic respiratory failure with hypoxia (HCC)  Brief Summary:- 69 y.o. 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, chronic atrial fibrillation with chronic anticoagulation, COPD, diabetes mellitus , h/o T11/L4 fusion after lumbar fracture 11/17/20withWound dehiscence noted12/7/20 Prior history of dysphagia requiring PEG tube placement 06/04/2019 chronic hypoxic respiratory failure admitted on 03/17/2019 from [facility] with acute on chronic hypoxic respiratory failure secondary to community-acquired pneumonia -Prior H/o Bacterial Endocarditis --s/p Mitral Valve Repair Previously Now has Strep group G Bacteremia---Needs TEE  A/p 1)Community-acquired Pneumonia---  --In the  ED chest x-ray was suggestive of pulmonary venous congestion Versus pneumonia -However given fevers, leukocytosis cough and dyspnea and hypoxia favor pneumonia -Continue IV Rocephin and azithromycin, bronchodilators and mucolytics -Repeat chest x-ray on 03/20/2020  2)Strep Group G Sepsis --patient had leukocytosis, tachypnea, tachycardia and fevers --- as well as confusion and hypoxia -- suspect secondary to #1 above -UA not suggestive of UTI -Patient's right foot wound and sacral wounds do not look particularly infected--please see photos in epic --Lactic acid is not elevated -Antibiotics as above #1 blood cultures from 03/16/20 with strep group G,--patient with prior history of endocarditis , repeat TTE from 03/18/2020 noted  repeat blood cultures on 03/19/2020 pending -WBC 16.7 >>9.9>>6.0 -Prior H/o Bacterial Endocarditis --s/p Mitral Valve Repair Previously Now has Strep group G Bacteremia---Needs TEE -If TEE is negative patient may be discharged home on p.o. Keflex to complete 2 weeks of antibiotic therapy for bacteremia (2 weeks from first negative culture) --Phone consult with Dr. Johny Sax from infectious disease  3) chronic anemia----hemoglobin currently above 11 which is higher than recent baseline, -No bleeding concerns at this time  4) chronic atrial fibrillation   --continue anticoagulation with Xarelto  5)Bioprosthetic Mitral Valve Replacement after Endocarditis in 2014--repeat echo from 03/18/20 showed---mitral valve has been repaired/replaced. No evidence of mitral valve regurgitation. Moderate to severe mitral stenosis. The mean mitral valve gradient is 11.7 mmHg at 89bpm. There is a 29 mm St. Jude tissue valve present in the mitral position.  --Cardiology  consult for possible TEE requested due to strep group G bacteremia  6)DM2- Use Novolog/Humalog Sliding scale insulin with Accu-Cheks/Fingersticks as ordered   7) decubitus ulcers--mostly on the buttocks and right  foot----wound care consult appreciated, recommendations noted  8) acute hypoxic respiratory failure--- secondary to #1 above, repeat chest x-ray on 03/20/2020 -Currently requiring 3 to 4 L of oxygen via nasal cannula  Disposition/Need for in-Hospital Stay- patient unable to be discharged at this time due to --pneumonia/sepsis strep group G bacteremia requiring IV antibiotics .-Awaiting cardiology input on possible TEE  Status is: Inpatient  Remains inpatient appropriate because:pneumonia/sepsis strep group G bacteremia requiring IV antibiotics .-Awaiting cardiology input on possible TEE  Dispo: The patient is from: ALF  Anticipated d/c is to:  [facility]  Anticipated d/c date is: 1 days  Patient currently is not medically stable to d/c. Barriers: Not Clinically Stable-pneumonia/sepsis strep group G bacteremia requiring IV antibiotics .-Awaiting cardiology input on possible TEE  Code Status : full  Family Communication:   (patient is alert, awake and coherent) Discussed with  son- [phone] on  Discussed with daughter at [phone]  Consults  :  Phone consult with ID and Cardiologist DVT Prophylaxis  :  Xarelto- SCDs   Lab Results  Component Value Date   PLT 108 (L) 03/19/2020    Inpatient Medications  Scheduled Meds: . ascorbic acid  500 mg Oral Daily  . carvedilol  3.125 mg Per Tube BID WC  . feeding supplement (ENSURE ENLIVE)   Oral BID  . ferrous sulfate  325 mg Oral BID WC  . fluticasone furoate-vilanterol  1 puff Inhalation Daily   And  . umeclidinium bromide  1 puff Inhalation Daily  . Gerhardt's butt cream   Topical QID  . insulin aspart  0-5 Units Subcutaneous QHS  . insulin aspart  0-6 Units Subcutaneous TID WC  . magnesium oxide  400 mg Oral BID  . metoCLOPramide  5 mg Oral TID AC  . multivitamin with minerals  1 tablet Per Tube Daily  . pantoprazole sodium  40 mg Per Tube Daily  . rivaroxaban  20 mg Per Tube Q  supper  . rosuvastatin  5 mg Oral Daily  . sodium chloride flush  3 mL Intravenous Q12H  . tamsulosin  0.4 mg Oral QPC breakfast  . Vilazodone HCl  40 mg Oral Daily   Continuous Infusions: . sodium chloride    . sodium chloride 50 mL/hr at 03/18/20 0627  . azithromycin 500 mg (03/18/20 1300)  . cefTRIAXone (ROCEPHIN)  IV 2 g (03/19/20 0914)   PRN Meds:.sodium chloride, acetaminophen, albuterol, ondansetron **OR** ondansetron (ZOFRAN) IV, oxyCODONE, polyethylene glycol, sodium chloride flush    Anti-infectives (From admission, onward)   Start     Dose/Rate Route Frequency Ordered Stop   03/17/20 0830  cefTRIAXone (ROCEPHIN) 2 g in sodium chloride 0.9 % 100 mL IVPB        2 g 200 mL/hr over 30 Minutes Intravenous Every 24 hours 03/17/20 0813     03/16/20 1945  cefTRIAXone (ROCEPHIN) 1 g in sodium chloride 0.9 % 100 mL IVPB  Status:  Discontinued        1 g 200 mL/hr over 30 Minutes Intravenous Every 24 hours 03/16/20 1933 03/17/20 0813   03/16/20 1945  azithromycin (ZITHROMAX) 500 mg in sodium chloride 0.9 % 250 mL IVPB        500 mg 250 mL/hr over 60 Minutes Intravenous Every 24 hours 03/16/20 1933  03/16/20 1115  cefTRIAXone (ROCEPHIN) 1 g in sodium chloride 0.9 % 100 mL IVPB        1 g 200 mL/hr over 30 Minutes Intravenous  Once 03/16/20 1103 03/16/20 1210   03/16/20 1115  azithromycin (ZITHROMAX) 500 mg in sodium chloride 0.9 % 250 mL IVPB        500 mg 250 mL/hr over 60 Minutes Intravenous  Once 03/16/20 1103 03/16/20 1326        Objective:   Vitals:   03/18/20 1800 03/18/20 2059 03/19/20 0607 03/19/20 0902  BP: 112/61 (!) 133/47 (!) 121/58 (!) 143/86  Pulse: 60 (!) 52 100   Resp:  19 20   Temp:  98.2 F (36.8 C) 98.2 F (36.8 C)   TempSrc:      SpO2:  95% 94%   Weight:      Height:        Wt Readings from Last 3 Encounters:  03/18/20 115.4 kg  03/01/20 110.7 kg  02/17/20 102.1 kg     Intake/Output Summary (Last 24 hours) at 03/19/2020 1144 Last data  filed at 03/18/2020 1822 Gross per 24 hour  Intake 300 ml  Output --  Net 300 ml    Physical Exam  Gen:- Awake Alert,  In no apparent distress  HEENT:- [place].AT, No sclera icterus Nose- [place] 4L/min Neck-Supple Neck,No JVD,.  Lungs-diminished breath sounds, few scattered rhonchi, no wheezing  CV- S1, S2 normal, regular , 3/6 SM Abd-  +ve B.Sounds, Abd Soft, No tenderness,    Psych-affect is appropriate, oriented x3 Neurological -generalized weakness and deconditioning but no new focal deficits  extremities -chronic lower extremity pedal edema noted, intact peripheral pulses  Skin - warm, dry--right foot and sacral decubitus please see photos in epic   Data Review:   Micro Results Recent Results (from the past 240 hour(s))  Urine culture     Status: Abnormal   Collection Time: 03/16/20  7:39 AM   Specimen: Urine, Catheterized  Result Value Ref Range Status   Specimen Description   Final    URINE, CATHETERIZED Performed at [facility], [address], [place], [place] [address]    Special Requests   Final    NONE Performed at [facility], [address], [place], [place] [address]    Culture MULTIPLE SPECIES PRESENT, SUGGEST RECOLLECTION (A)  Final   Report Status 03/17/2020 FINAL  Final  SARS Coronavirus 2 by RT PCR (hospital order, performed in [facility] hospital lab) Nasopharyngeal Nasopharyngeal Swab     Status: None   Collection Time: 03/16/20 10:03 AM   Specimen: Nasopharyngeal Swab  Result Value Ref Range Status   SARS Coronavirus 2 NEGATIVE NEGATIVE Final    Comment: (NOTE) SARS-CoV-2 target nucleic acids are NOT DETECTED.  The SARS-CoV-2 RNA is generally detectable in upper and lower respiratory specimens during the acute phase of infection. The lowest concentration of SARS-CoV-2 viral copies this assay can detect is 250 copies / mL. A negative result does not preclude SARS-CoV-2 infection and should not be used as the sole basis for treatment or other patient  management decisions.  A negative result may occur with improper specimen collection / handling, submission of specimen other than nasopharyngeal swab, presence of viral mutation(s) within the areas targeted by this assay, and inadequate number of viral copies (<250 copies / mL). A negative result must be combined with clinical observations, patient history, and epidemiological information.  Fact Sheet for Patients:   BoilerBrush.com.cy  Fact Sheet for  Healthcare Providers: https://pope.com/  This test is not yet approved or  cleared by the [place] and has been authorized for detection and/or diagnosis of SARS-CoV-2 by FDA under an Emergency Use Authorization (EUA).  This EUA will remain in effect (meaning this test can be used) for the duration of the COVID-19 declaration under Section 564(b)(1) of the Act, 21 U.S.C. section 360bbb-3(b)(1), unless the authorization is terminated or revoked sooner.  Performed at [facility], [address], [place], [place] [address]   Blood Culture (routine x 2)     Status: Abnormal   Collection Time: 03/16/20 10:22 AM   Specimen: BLOOD  Result Value Ref Range Status   Specimen Description   Final    BLOOD LEFT ANTECUBITAL Performed at [facility], [address], [place], [place] [address]    Special Requests   Final    BOTTLES DRAWN AEROBIC ONLY Blood Culture adequate volume Performed at [facility], [address], [place], [place] [address]    Culture  Setup Time   Final    AEROBIC BOTTLE ONLY GRAM POSITIVE COCCI IN CHAINS Gram Stain Report Called to,Read Back By and Verified With: C THOMAS,RN@[id number]  03/17/20 MKELLY Organism ID to follow CRITICAL RESULT CALLED TO, READ BACK BY AND VERIFIED WITH: T MAYO RN 03/17/20 0618 JDW Performed at [facility], [address], [place], [place] [address]    Culture STREPTOCOCCUS GROUP G (A)  Final   Report Status 03/19/2020 FINAL  Final     Organism ID, Bacteria STREPTOCOCCUS GROUP G  Final      Susceptibility   Streptococcus group g - MIC*    CLINDAMYCIN <=0.25 SENSITIVE Sensitive     AMPICILLIN <=0.25 SENSITIVE Sensitive     ERYTHROMYCIN <=0.12 SENSITIVE Sensitive     VANCOMYCIN <=0.12 SENSITIVE Sensitive     CEFTRIAXONE <=0.12 SENSITIVE Sensitive     LEVOFLOXACIN 0.5 SENSITIVE Sensitive     PENICILLIN Value in next row Sensitive      SENSITIVE<=0.06    * STREPTOCOCCUS GROUP G  Blood Culture (routine x 2)     Status: None (Preliminary result)   Collection Time: 03/16/20 10:22 AM   Specimen: BLOOD  Result Value Ref Range Status   Specimen Description BLOOD LEFT HAND  Final   Special Requests   Final    BOTTLES DRAWN AEROBIC ONLY Blood Culture adequate volume   Culture   Final    NO GROWTH 2 DAYS Performed at [facility], [address], [place], [place] [address]    Report Status PENDING  Incomplete  Blood Culture ID Panel (Reflexed)     Status: Abnormal   Collection Time: 03/16/20 10:22 AM  Result Value Ref Range Status   Enterococcus faecalis NOT DETECTED NOT DETECTED Final   Enterococcus Faecium NOT DETECTED NOT DETECTED Final   Listeria monocytogenes NOT DETECTED NOT DETECTED Final   Staphylococcus species NOT DETECTED NOT DETECTED Final   Staphylococcus aureus (BCID) NOT DETECTED NOT DETECTED Final   Staphylococcus epidermidis NOT DETECTED NOT DETECTED Final   Staphylococcus lugdunensis NOT DETECTED NOT DETECTED Final   Streptococcus species DETECTED (A) NOT DETECTED Final    Comment: Not Enterococcus species, Streptococcus agalactiae, Streptococcus pyogenes, or Streptococcus pneumoniae. CRITICAL RESULT CALLED TO, READ BACK BY AND VERIFIED WITH: T MAYO RN 03/17/20 0618 JDW    Streptococcus agalactiae NOT DETECTED NOT DETECTED Final   Streptococcus pneumoniae NOT DETECTED NOT DETECTED Final   Streptococcus pyogenes NOT DETECTED NOT DETECTED Final   A.calcoaceticus-baumannii  NOT DETECTED NOT DETECTED  Final   Bacteroides fragilis NOT DETECTED NOT DETECTED Final   Enterobacterales NOT DETECTED NOT DETECTED Final   Enterobacter cloacae complex NOT DETECTED NOT DETECTED Final   Escherichia coli NOT DETECTED NOT DETECTED Final   Klebsiella aerogenes NOT DETECTED NOT DETECTED Final   Klebsiella oxytoca NOT DETECTED NOT DETECTED Final   Klebsiella pneumoniae NOT DETECTED NOT DETECTED Final   Proteus species NOT DETECTED NOT DETECTED Final   Salmonella species NOT DETECTED NOT DETECTED Final   Serratia marcescens NOT DETECTED NOT DETECTED Final   Haemophilus influenzae NOT DETECTED NOT DETECTED Final   Neisseria meningitidis NOT DETECTED NOT DETECTED Final   Pseudomonas aeruginosa NOT DETECTED NOT DETECTED Final   Stenotrophomonas maltophilia NOT DETECTED NOT DETECTED Final   Candida albicans NOT DETECTED NOT DETECTED Final   Candida auris NOT DETECTED NOT DETECTED Final   Candida glabrata NOT DETECTED NOT DETECTED Final   Candida krusei NOT DETECTED NOT DETECTED Final   Candida parapsilosis NOT DETECTED NOT DETECTED Final   Candida tropicalis NOT DETECTED NOT DETECTED Final   Cryptococcus neoformans/gattii NOT DETECTED NOT DETECTED Final    Comment: Performed at [facility], [address], [place], [place] [address]  MRSA PCR Screening     Status: None   Collection Time: 03/16/20  8:44 PM   Specimen: Nasopharyngeal  Result Value Ref Range Status   MRSA by PCR NEGATIVE NEGATIVE Final    Comment:        The GeneXpert MRSA Assay (FDA approved for NASAL specimens only), is one component of a comprehensive MRSA colonization surveillance program. It is not intended to diagnose MRSA infection nor to guide or monitor treatment for MRSA infections. Performed at [facility], [address] [place], [place] [address]     Radiology Reports DG Chest [place] 1 View  Result Date: 03/16/2020 CLINICAL DATA:  Concern for sepsis. EXAM: PORTABLE CHEST 1 VIEW COMPARISON:  06/17/2019 chest  radiograph and prior. FINDINGS: Left basilar/retrocardiac patchy opacities. No pneumothorax. Small left pleural effusion. Cardiomegaly and prominence of the central pulmonary vessels. Post sternotomy sequela. IMPRESSION: Cardiomegaly and central pulmonary vascular congestion. Left basilar opacities. Differential includes infection, edema and atelectasis. Electronically Signed   By: Stana Bunting M.D.   On: 03/16/2020 08:14   ECHOCARDIOGRAM COMPLETE  Result Date: 03/18/2020    ECHOCARDIOGRAM REPORT   Patient Name:   CASTIN CAMPILLO Date of Exam: 03/18/2020 Medical Rec #:  [id number]    Height:       69.0 in Accession #:    [id number]   Weight:       240.0 lb Date of Birth:  March 26, 1951    BSA:          2.232 m Patient Age:    69 years     BP:           110/68 mmHg Patient Gender: M            HR:           67 bpm. [facility] Procedure: 2D Echo and Intracardiac Opacification Agent Indications:    Bacteremia R78.81  History:        Patient has prior history of Echocardiogram examinations, most                 recent 05/20/2019. CHF, COPD, Arrythmias:Atrial Fibrillation;                 Risk Factors:Hypertension and Diabetes.  Mitral Valve: 29 mm St. Jude tissue valve valve is present in                 the mitral position. Procedure Date: 09/07/2012.  Sonographer:    Thurman Coyer RDCS (AE) Referring Phys: [id number] [place] IMPRESSIONS  1. Left ventricular ejection fraction, by estimation, is 35 to 40%. The left ventricle has moderately decreased function. The left ventricle demonstrates global hypokinesis. There is mild concentric left ventricular hypertrophy. Left ventricular diastolic parameters are indeterminate.  2. Right ventricular systolic function is normal. The right ventricular size is normal. There is moderately elevated pulmonary artery systolic pressure. The estimated right ventricular systolic pressure is 54.9 mmHg.  3. Left atrial size was severely dilated.  4.  The mitral valve has been repaired/replaced. No evidence of mitral valve regurgitation. Moderate to severe mitral stenosis. The mean mitral valve gradient is 11.7 mmHg at 89bpm. There is a 29 mm St. Jude tissue valve present in the mitral position. Procedure Date: 09/07/2012. Echo findings are consistent with normal structure and function of the mitral valve prosthesis.  5. The aortic valve is tricuspid. Aortic valve regurgitation is not visualized. Mild aortic valve sclerosis is present, with no evidence of aortic valve stenosis.  6. The inferior vena cava is dilated in size with <50% respiratory variability, suggesting right atrial pressure of 15 mmHg.  7. Compared to prior study 05/2018, the mean MV gradient of the MV tissue valve has increased from 8 to . FINDINGS  Left Ventricle: Left ventricular ejection fraction, by estimation, is 35 to 40%. The left ventricle has moderately decreased function. The left ventricle demonstrates global hypokinesis. Definity contrast agent was given IV to delineate the left ventricular endocardial borders. The left ventricular internal cavity size was normal in size. There is mild concentric left ventricular hypertrophy. Left ventricular diastolic parameters are indeterminate. Right Ventricle: The right ventricular size is normal. No increase in right ventricular wall thickness. Right ventricular systolic function is normal. There is moderately elevated pulmonary artery systolic pressure. The tricuspid regurgitant velocity is 3.16 m/s, and with an assumed right atrial pressure of 15 mmHg, the estimated right ventricular systolic pressure is 54.9 mmHg. Left Atrium: Left atrial size was severely dilated. Right Atrium: Right atrial size was normal in size. Pericardium: There is no evidence of pericardial effusion. Mitral Valve: The mitral valve has been repaired/replaced. No evidence of mitral valve regurgitation. There is a 29 mm St. Jude tissue valve present in the mitral  position. Procedure Date: 09/07/2012. Echo findings are consistent with normal structure and function of the mitral valve prosthesis. Moderate to severe mitral valve stenosis. MV peak gradient, 28.7 mmHg. The mean mitral valve gradient is 11.7 mmHg. Tricuspid Valve: The tricuspid valve is normal in structure. Tricuspid valve regurgitation is mild . No evidence of tricuspid stenosis. Aortic Valve: The aortic valve is tricuspid. Aortic valve regurgitation is not visualized. Mild aortic valve sclerosis is present, with no evidence of aortic valve stenosis. Pulmonic Valve: The pulmonic valve was normal in structure. Pulmonic valve regurgitation is trivial. No evidence of pulmonic stenosis. Aorta: The aortic root is normal in size and structure. Venous: The inferior vena cava is dilated in size with less than 50% respiratory variability, suggesting right atrial pressure of 15 mmHg. IAS/Shunts: No atrial level shunt detected by color flow Doppler.  LEFT VENTRICLE PLAX 2D LVIDd:         5.42 cm LVIDs:         4.46 cm LV PW:  1.22 cm LV IVS:        1.30 cm LVOT diam:     2.50 cm LV SV:         59 LV SV Index:   26 LVOT Area:     4.91 cm  RIGHT VENTRICLE TAPSE (M-mode): 1.0 cm LEFT ATRIUM              Index       RIGHT ATRIUM           Index LA diam:        5.10 cm  2.28 cm/m  RA Area:     20.30 cm LA Vol (A2C):   121.0 ml 54.20 ml/m RA Volume:   59.20 ml  26.52 ml/m LA Vol (A4C):   114.0 ml 51.06 ml/m LA Biplane Vol: 123.0 ml 55.10 ml/m  AORTIC VALVE LVOT Vmax:   72.10 cm/s LVOT Vmean:  48.067 cm/s LVOT VTI:    0.120 m  AORTA Ao Root diam: 3.60 cm MITRAL VALVE              TRICUSPID VALVE MV Area (PHT): 2.16 cm   TR Peak grad:   39.9 mmHg MV Peak grad:  28.7 mmHg  TR Vmax:        316.00 cm/s MV Mean grad:  11.7 mmHg MV Vmax:       2.68 m/s   SHUNTS MV Vmean:      159.3 cm/s Systemic VTI:  0.12 m                           Systemic Diam: 2.50 cm Armanda Magic MD Electronically signed by Armanda Magic MD Signature  Date/Time: 03/18/2020/4:27:42 PM    Final      CBC Recent Labs  Lab 03/16/20 1022 03/17/20 0449 03/19/20 0840  WBC 16.7* 9.9 6.0  HGB 11.7* 11.2* 11.3*  HCT 38.8* 37.6* 38.0*  PLT 148* 116* 108*  MCV 96.0 98.4 97.2  MCH 29.0 29.3 28.9  MCHC 30.2 29.8* 29.7*  RDW 17.1* 17.3* 16.5*  LYMPHSABS 0.5*  --   --   MONOABS 1.2*  --   --   EOSABS 0.0  --   --   BASOSABS 0.0  --   --     Chemistries  Recent Labs  Lab 03/16/20 1022 03/17/20 0449 03/19/20 0840  NA 141 141 136  K 4.2 4.0 4.4  CL 99 104 100  CO2 32 30 29  GLUCOSE 120* 82 156*  BUN 24* 25* 30*  CREATININE 1.04 0.87 0.72  CALCIUM 8.6* 8.3* 8.3*  AST 15  --   --   ALT 10  --   --   ALKPHOS 75  --   --   BILITOT 0.9  --   --    ------------------------------------------------------------------------------------------------------------------ No results for input(s): CHOL, HDL, LDLCALC, TRIG, CHOLHDL, LDLDIRECT in the last 72 hours.  Lab Results  Component Value Date   HGBA1C 5.6 03/01/2020   ------------------------------------------------------------------------------------------------------------------ No results for input(s): TSH, T4TOTAL, T3FREE, THYROIDAB in the last 72 hours.  Invalid input(s): FREET3 ------------------------------------------------------------------------------------------------------------------ No results for input(s): VITAMINB12, FOLATE, FERRITIN, TIBC, IRON, RETICCTPCT in the last 72 hours.  Coagulation profile Recent Labs  Lab 03/16/20 1022  INR 2.3*    No results for input(s): DDIMER in the last 72 hours.  Cardiac Enzymes No results for input(s): CKMB, TROPONINI, MYOGLOBIN in the last 168 hours.  Invalid input(s): CK ------------------------------------------------------------------------------------------------------------------  Component Value Date/Time   BNP 368.9 (H) 06/03/2019 2149     Shon Hale M.D on 03/19/2020 at 11:44 AM  Go to [email or web address] - for  contact info  [facility] - Office  [phone]

## 2020-03-20 ENCOUNTER — Inpatient Hospital Stay (HOSPITAL_COMMUNITY): Payer: Medicare Other

## 2020-03-20 DIAGNOSIS — Z953 Presence of xenogenic heart valve: Secondary | ICD-10-CM

## 2020-03-20 DIAGNOSIS — G4733 Obstructive sleep apnea (adult) (pediatric): Secondary | ICD-10-CM

## 2020-03-20 LAB — GLUCOSE, CAPILLARY
Glucose-Capillary: 137 mg/dL — ABNORMAL HIGH (ref 70–99)
Glucose-Capillary: 134 mg/dL — ABNORMAL HIGH (ref 70–99)
Glucose-Capillary: 149 mg/dL — ABNORMAL HIGH (ref 70–99)
Glucose-Capillary: 155 mg/dL — ABNORMAL HIGH (ref 70–99)

## 2020-03-20 NOTE — Progress Notes (Signed)
Consulted for TEE for this patient in setting of bacteremia. On 5L [place] as recent as 1pm this AM, would need to have respiratory status improved prior to procedure. Having thoracentesis today, perhaps will have improvement after. We will evalaute on rounds tomorrow, have not scheduled TEE at this time   Dina Rich MD

## 2020-03-20 NOTE — Progress Notes (Signed)
Unable to perform thoracentesis today due to patient unable to position for procedure.  Has remained npo and says does not want anything to eat.  Audible crackles.  Seems to aspirate on thin liquids.  Notified Dr. Macky Lower and she is ordering SLP.

## 2020-03-20 NOTE — Progress Notes (Addendum)
Patient Demographics:    Cody Hale, is a 69 y.o. male, DOB - 16-Jan-1951, DXA:[id number]  Admit date - 03/16/2020   Admitting Physician Courage Mariea Clonts, MD  Outpatient Primary MD for the patient is [name], MD  LOS - 4  Chief Complaint  Patient presents with  . Fever        Subjective:    Cody Hale remains on oxygen nasal cannula.  He appears lethargic but arousable and oriented.   Assessment  & Plan :    Principal Problem:   Sepsis due to pneumonia Active Problems:   OSA (obstructive sleep apnea)   Essential hypertension, malignant   Type II diabetes mellitus with manifestations (HCC)   Chronic systolic CHF (congestive heart failure) (HCC)   COPD (chronic obstructive pulmonary disease) with chronic bronchitis (HCC)   Chronic atrial fibrillation   S/P mitral valve replacement with bioprosthetic valve   Major depressive disorder, recurrent severe without psychotic features (HCC)   Chronic respiratory failure with hypoxia (HCC)   PNA (pneumonia)   Streptococcus G Bacteremia  Brief Summary:- 69 y.o. 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, chronic atrial fibrillation with chronic anticoagulation, COPD, diabetes mellitus , h/o T11/L4 fusion after lumbar fracture 11/17/20withWound dehiscence noted12/7/20 Prior history of dysphagia requiring PEG tube placement 06/04/2019 chronic hypoxic respiratory failure admitted on 03/17/2019 from [facility] with acute on chronic hypoxic respiratory failure secondary to community-acquired pneumonia -Prior H/o Bacterial Endocarditis --s/p Mitral Valve Repair Previously Now has Strep group G Bacteremia---Needs TEE  A/p 1)Community-acquired Pneumonia---  --In the ED chest x-ray was suggestive of pulmonary venous congestion Versus  pneumonia -However given fevers, leukocytosis cough and dyspnea and hypoxia favor pneumonia -Continue IV Rocephin and azithromycin, bronchodilators and mucolytics -Repeat chest x-ray today showing stable complete opacification of the left hemithorax likely combination of effusion and atelectasis. -Plan for thoracentesis today. -He remains on oxygen by nasal cannula.  If his respite status is not improving consider CT of the chest to better evaluate.  2)Strep Group G Sepsis --patient had leukocytosis, tachypnea, tachycardia and fevers --- as well as confusion and hypoxia -- suspect secondary to #1 above -UA not suggestive of UTI -Patient's right foot wound and sacral wounds do not look particularly infected--please see photos in epic --Lactic acid is not elevated -Antibiotics as above #1 blood cultures from 03/16/20 with strep group G,--patient with prior history of endocarditis , repeat TTE from 03/18/2020 noted  repeat blood cultures on 03/19/2020 no growth so far. -WBC 16.7 >>9.9>>6.0 -Prior H/o Bacterial Endocarditis --s/p Mitral Valve Repair Previously Now has Strep group G Bacteremia---Needs TEE -If TEE is negative patient may be discharged home on p.o. Keflex to complete 2 weeks of antibiotic therapy for bacteremia (2 weeks from first negative culture) --Phone consult with Dr. Johny Sax from infectious disease  3) chronic anemia----hemoglobin currently above 11 which is higher than recent baseline, -No bleeding concerns at this time  4) chronic atrial fibrillation   --continue anticoagulation with Xarelto  5)Bioprosthetic Mitral Valve Replacement after Endocarditis in 2014--repeat echo from 03/18/20 showed---mitral valve has been repaired/replaced. No evidence of mitral valve regurgitation. Moderate to severe mitral stenosis. The mean mitral  valve gradient is 11.7 mmHg at 89bpm. There is a 29 mm St. Jude tissue valve present in the mitral position.  --Cardiology consult for  possible TEE requested due to strep group G bacteremia  6)DM2- Use Novolog/Humalog Sliding scale insulin with Accu-Cheks/Fingersticks as ordered   7) decubitus ulcers--mostly on the buttocks and right foot----wound care consult appreciated, recommendations noted  8) acute hypoxic respiratory failure--- secondary to #1 above, repeat chest x-ray on 03/20/2020 -Currently requiring 5 L of oxygen via nasal cannula  Disposition/Need for in-Hospital Stay- patient unable to be discharged at this time due to --pneumonia/sepsis strep group G bacteremia requiring IV antibiotics .-Awaiting cardiology input on possible TEE  Status is: Inpatient  Remains inpatient appropriate because:pneumonia/sepsis strep group G bacteremia requiring IV antibiotics .-Awaiting cardiology input on possible TEE  Dispo: The patient is from: ALF  Anticipated d/c is to:  [facility]  Anticipated d/c date is: 2 days  Patient currently is not medically stable to d/c. Barriers: Not Clinically Stable-pneumonia/sepsis strep group G bacteremia requiring IV antibiotics .-Awaiting cardiology input on possible TEE  Code Status : full  Family Communication: No family at bedside today.  Consults  :  Phone consult with ID and Cardiologist DVT Prophylaxis  :  Xarelto- SCDs   Lab Results  Component Value Date   PLT 108 (L) 03/19/2020    Inpatient Medications  Scheduled Meds: . ascorbic acid  500 mg Oral Daily  . carvedilol  3.125 mg Per Tube BID WC  . feeding supplement (ENSURE ENLIVE)   Oral BID  . ferrous sulfate  325 mg Oral BID WC  . fluticasone furoate-vilanterol  1 puff Inhalation Daily   And  . umeclidinium bromide  1 puff Inhalation Daily  . Gerhardt's butt cream   Topical QID  . insulin aspart  0-5 Units Subcutaneous QHS  . insulin aspart  0-6 Units Subcutaneous TID WC  . magnesium oxide  400 mg Oral BID  . metoCLOPramide  5 mg Oral TID AC  . multivitamin with  minerals  1 tablet Per Tube Daily  . pantoprazole  40 mg Oral Daily  . rivaroxaban  20 mg Per Tube Q supper  . rosuvastatin  5 mg Oral Daily  . sodium chloride flush  3 mL Intravenous Q12H  . tamsulosin  0.4 mg Oral QPC breakfast  . Vilazodone HCl  40 mg Oral Daily   Continuous Infusions: . sodium chloride    . sodium chloride 20 mL/hr at 03/19/20 1315  . azithromycin 500 mg (03/19/20 1317)  . cefTRIAXone (ROCEPHIN)  IV 2 g (03/19/20 0914)   PRN Meds:.sodium chloride, acetaminophen, albuterol, ondansetron **OR** ondansetron (ZOFRAN) IV, oxyCODONE, polyethylene glycol, sodium chloride flush    Anti-infectives (From admission, onward)   Start     Dose/Rate Route Frequency Ordered Stop   03/17/20 0830  cefTRIAXone (ROCEPHIN) 2 g in sodium chloride 0.9 % 100 mL IVPB        2 g 200 mL/hr over 30 Minutes Intravenous Every 24 hours 03/17/20 0813     03/16/20 1945  cefTRIAXone (ROCEPHIN) 1 g in sodium chloride 0.9 % 100 mL IVPB  Status:  Discontinued        1 g 200 mL/hr over 30 Minutes Intravenous Every 24 hours 03/16/20 1933 03/17/20 0813   03/16/20 1945  azithromycin (ZITHROMAX) 500 mg in sodium chloride 0.9 % 250 mL IVPB        500 mg 250 mL/hr over 60 Minutes Intravenous Every 24 hours 03/16/20  1933     03/16/20 1115  cefTRIAXone (ROCEPHIN) 1 g in sodium chloride 0.9 % 100 mL IVPB        1 g 200 mL/hr over 30 Minutes Intravenous  Once 03/16/20 1103 03/16/20 1210   03/16/20 1115  azithromycin (ZITHROMAX) 500 mg in sodium chloride 0.9 % 250 mL IVPB        500 mg 250 mL/hr over 60 Minutes Intravenous  Once 03/16/20 1103 03/16/20 1326        Objective:   Vitals:   03/19/20 1533 03/19/20 1730 03/19/20 2239 03/20/20 0806  BP:  (!) 153/64 (!) 133/54   Pulse:  100 86   Resp:   20   Temp:   99.2 F (37.3 C)   TempSrc:      SpO2: (!) 88% 95% 97% 92%  Weight:      Height:        Wt Readings from Last 3 Encounters:  03/18/20 115.4 kg  03/01/20 110.7 kg  02/17/20 102.1 kg      Intake/Output Summary (Last 24 hours) at 03/20/2020 0845 Last data filed at 03/19/2020 1629 Gross per 24 hour  Intake --  Output 50 ml  Net -50 ml    Physical Exam  Gen: Lethargic but arousable HEENT:- [place].AT, No sclera icterus Nose-  4L/min Neck-Supple Neck,No JVD,.  Lungs-diminished breath sounds, scattered rhonchi, no wheezing  CV- S1, S2 normal, regular , 3/6 SM Abd-  +ve B.Sounds, Abd Soft, No tenderness,    Psych-affect is appropriate, oriented x3 Neurological -lethargic but arousable, not following commands at this time. extremities -chronic lower extremity pedal edema noted, intact peripheral pulses  Skin - warm, dry--right foot and sacral decubitus please see photos in epic   Data Review:   Micro Results Recent Results (from the past 240 hour(s))  Urine culture     Status: Abnormal   Collection Time: 03/16/20  7:39 AM   Specimen: Urine, Catheterized  Result Value Ref Range Status   Specimen Description   Final    URINE, CATHETERIZED Performed at [facility], [address] [place], [place] [address]    Special Requests   Final    NONE Performed at [facility], [address], [place], [place] [address]    Culture MULTIPLE SPECIES PRESENT, SUGGEST RECOLLECTION (A)  Final   Report Status 03/17/2020 FINAL  Final  SARS Coronavirus 2 by RT PCR (hospital order, performed in [facility] hospital lab) Nasopharyngeal Nasopharyngeal Swab     Status: None   Collection Time: 03/16/20 10:03 AM   Specimen: Nasopharyngeal Swab  Result Value Ref Range Status   SARS Coronavirus 2 NEGATIVE NEGATIVE Final    Comment: (NOTE) SARS-CoV-2 target nucleic acids are NOT DETECTED.  The SARS-CoV-2 RNA is generally detectable in upper and lower respiratory specimens during the acute phase of infection. The lowest concentration of SARS-CoV-2 viral copies this assay can detect is 250 copies / mL. A negative result does not preclude SARS-CoV-2 infection and should not be used as  the sole basis for treatment or other patient management decisions.  A negative result may occur with improper specimen collection / handling, submission of specimen other than nasopharyngeal swab, presence of viral mutation(s) within the areas targeted by this assay, and inadequate number of viral copies (<250 copies / mL). A negative result must be combined with clinical observations, patient history, and epidemiological information.  Fact Sheet for Patients:   BoilerBrush.com.cy  Fact Sheet for Healthcare Providers: https://pope.com/  This test  is not yet approved or  cleared by the [place] and has been authorized for detection and/or diagnosis of SARS-CoV-2 by FDA under an Emergency Use Authorization (EUA).  This EUA will remain in effect (meaning this test can be used) for the duration of the COVID-19 declaration under Section 564(b)(1) of the Act, 21 U.S.C. section 360bbb-3(b)(1), unless the authorization is terminated or revoked sooner.  Performed at [facility], [address] [place], [place] [address]   Blood Culture (routine x 2)     Status: Abnormal   Collection Time: 03/16/20 10:22 AM   Specimen: BLOOD  Result Value Ref Range Status   Specimen Description   Final    BLOOD LEFT ANTECUBITAL Performed at [facility], [address], [place], [place] [address]    Special Requests   Final    BOTTLES DRAWN AEROBIC ONLY Blood Culture adequate volume Performed at [facility], [address], [place], [place] [address]    Culture  Setup Time   Final    AEROBIC BOTTLE ONLY GRAM POSITIVE COCCI IN CHAINS Gram Stain Report Called to,Read Back By and Verified With: C THOMAS,RN@[id number]  03/17/20 MKELLY Organism ID to follow CRITICAL RESULT CALLED TO, READ BACK BY AND VERIFIED WITH: T MAYO RN 03/17/20 0618 JDW Performed at [facility], [address] [place], [place] [address]    Culture STREPTOCOCCUS GROUP G (A)   Final   Report Status 03/19/2020 FINAL  Final   Organism ID, Bacteria STREPTOCOCCUS GROUP G  Final      Susceptibility   Streptococcus group g - MIC*    CLINDAMYCIN <=0.25 SENSITIVE Sensitive     AMPICILLIN <=0.25 SENSITIVE Sensitive     ERYTHROMYCIN <=0.12 SENSITIVE Sensitive     VANCOMYCIN <=0.12 SENSITIVE Sensitive     CEFTRIAXONE <=0.12 SENSITIVE Sensitive     LEVOFLOXACIN 0.5 SENSITIVE Sensitive     PENICILLIN Value in next row Sensitive      SENSITIVE<=0.06    * STREPTOCOCCUS GROUP G  Blood Culture (routine x 2)     Status: None (Preliminary result)   Collection Time: 03/16/20 10:22 AM   Specimen: BLOOD  Result Value Ref Range Status   Specimen Description BLOOD LEFT HAND  Final   Special Requests   Final    BOTTLES DRAWN AEROBIC ONLY Blood Culture adequate volume   Culture   Final    NO GROWTH 2 DAYS Performed at [facility], [address] [place], [place] [address]    Report Status PENDING  Incomplete  Blood Culture ID Panel (Reflexed)     Status: Abnormal   Collection Time: 03/16/20 10:22 AM  Result Value Ref Range Status   Enterococcus faecalis NOT DETECTED NOT DETECTED Final   Enterococcus Faecium NOT DETECTED NOT DETECTED Final   Listeria monocytogenes NOT DETECTED NOT DETECTED Final   Staphylococcus species NOT DETECTED NOT DETECTED Final   Staphylococcus aureus (BCID) NOT DETECTED NOT DETECTED Final   Staphylococcus epidermidis NOT DETECTED NOT DETECTED Final   Staphylococcus lugdunensis NOT DETECTED NOT DETECTED Final   Streptococcus species DETECTED (A) NOT DETECTED Final    Comment: Not Enterococcus species, Streptococcus agalactiae, Streptococcus pyogenes, or Streptococcus pneumoniae. CRITICAL RESULT CALLED TO, READ BACK BY AND VERIFIED WITH: T MAYO RN 03/17/20 0618 JDW    Streptococcus agalactiae NOT DETECTED NOT DETECTED Final   Streptococcus pneumoniae NOT DETECTED NOT DETECTED Final   Streptococcus pyogenes NOT DETECTED NOT DETECTED Final    A.calcoaceticus-baumannii NOT DETECTED NOT DETECTED Final  Bacteroides fragilis NOT DETECTED NOT DETECTED Final   Enterobacterales NOT DETECTED NOT DETECTED Final   Enterobacter cloacae complex NOT DETECTED NOT DETECTED Final   Escherichia coli NOT DETECTED NOT DETECTED Final   Klebsiella aerogenes NOT DETECTED NOT DETECTED Final   Klebsiella oxytoca NOT DETECTED NOT DETECTED Final   Klebsiella pneumoniae NOT DETECTED NOT DETECTED Final   Proteus species NOT DETECTED NOT DETECTED Final   Salmonella species NOT DETECTED NOT DETECTED Final   Serratia marcescens NOT DETECTED NOT DETECTED Final   Haemophilus influenzae NOT DETECTED NOT DETECTED Final   Neisseria meningitidis NOT DETECTED NOT DETECTED Final   Pseudomonas aeruginosa NOT DETECTED NOT DETECTED Final   Stenotrophomonas maltophilia NOT DETECTED NOT DETECTED Final   Candida albicans NOT DETECTED NOT DETECTED Final   Candida auris NOT DETECTED NOT DETECTED Final   Candida glabrata NOT DETECTED NOT DETECTED Final   Candida krusei NOT DETECTED NOT DETECTED Final   Candida parapsilosis NOT DETECTED NOT DETECTED Final   Candida tropicalis NOT DETECTED NOT DETECTED Final   Cryptococcus neoformans/gattii NOT DETECTED NOT DETECTED Final    Comment: Performed at [facility], [address] [place], [place] [address]  MRSA PCR Screening     Status: None   Collection Time: 03/16/20  8:44 PM   Specimen: Nasopharyngeal  Result Value Ref Range Status   MRSA by PCR NEGATIVE NEGATIVE Final    Comment:        The GeneXpert MRSA Assay (FDA approved for NASAL specimens only), is one component of a comprehensive MRSA colonization surveillance program. It is not intended to diagnose MRSA infection nor to guide or monitor treatment for MRSA infections. Performed at [facility], [address], [place], [place] [address]   Culture, blood (Routine X 2) w Reflex to ID Panel     Status: None (Preliminary result)   Collection Time:  03/19/20  8:44 AM   Specimen: Right Antecubital; Blood  Result Value Ref Range Status   Specimen Description   Final    RIGHT ANTECUBITAL BOTTLES DRAWN AEROBIC AND ANAEROBIC   Special Requests   Final    Blood Culture adequate volume Performed at [facility], [address], [place], [place] [address]    Culture PENDING  Incomplete   Report Status PENDING  Incomplete  Culture, blood (Routine X 2) w Reflex to ID Panel     Status: None (Preliminary result)   Collection Time: 03/19/20  8:44 AM   Specimen: BLOOD RIGHT HAND  Result Value Ref Range Status   Specimen Description   Final    BLOOD RIGHT HAND BOTTLES DRAWN AEROBIC AND ANAEROBIC   Special Requests   Final    Blood Culture adequate volume Performed at [facility], [address], [place], [place] [address]    Culture PENDING  Incomplete   Report Status PENDING  Incomplete    Radiology Reports DG CHEST PORT 1 VIEW  Result Date: 03/19/2020 CLINICAL DATA:  Worsening dyspnea today, COPD, diabetes mellitus, hypertension, negative COVID-19 test on 03/16/2020 EXAM: PORTABLE CHEST 1 VIEW COMPARISON:  Portable exam 1603 hours compared to 03/16/2020 FINDINGS: Opacification of LEFT hemithorax by pleural effusion and atelectasis increased from prior study. LEFT heart border obscured. Patchy infiltrates mid to lower RIGHT lung with minimal basilar atelectasis and probable small pleural effusion. No pneumothorax. Bones demineralized. IMPRESSION: Patchy RIGHT lung infiltrates with basilar pleural effusion and atelectasis. Opacified LEFT hemithorax by combination of atelectasis and pleural effusion, underlying infiltrate not excluded. Electronically Signed  By: Ulyses Southward M.D.   On: 03/19/2020 16:21   DG Chest Port 1 View  Result Date: 03/16/2020 CLINICAL DATA:  Concern for sepsis. EXAM: PORTABLE CHEST 1 VIEW COMPARISON:  06/17/2019 chest radiograph and prior. FINDINGS: Left basilar/retrocardiac patchy opacities. No pneumothorax. Small left  pleural effusion. Cardiomegaly and prominence of the central pulmonary vessels. Post sternotomy sequela. IMPRESSION: Cardiomegaly and central pulmonary vascular congestion. Left basilar opacities. Differential includes infection, edema and atelectasis. Electronically Signed   By: Stana Bunting M.D.   On: 03/16/2020 08:14   ECHOCARDIOGRAM COMPLETE  Result Date: 03/18/2020    ECHOCARDIOGRAM REPORT   Patient Name:   JAKYLIN HONAN Date of Exam: 03/18/2020 Medical Rec #:  [id number]    Height:       69.0 in Accession #:    [id number]   Weight:       240.0 lb Date of Birth:  1950-10-09    BSA:          2.232 m Patient Age:    69 years     BP:           110/68 mmHg Patient Gender: M            HR:           67 bpm. [facility] Procedure: 2D Echo and Intracardiac Opacification Agent Indications:    Bacteremia R78.81  History:        Patient has prior history of Echocardiogram examinations, most                 recent 05/20/2019. CHF, COPD, Arrythmias:Atrial Fibrillation;                 Risk Factors:Hypertension and Diabetes.                  Mitral Valve: 29 mm St. Jude tissue valve valve is present in                 the mitral position. Procedure Date: 09/07/2012.  Sonographer:    Thurman Coyer RDCS (AE) Referring Phys: [id number] COURAGE EMOKPAE IMPRESSIONS  1. Left ventricular ejection fraction, by estimation, is 35 to 40%. The left ventricle has moderately decreased function. The left ventricle demonstrates global hypokinesis. There is mild concentric left ventricular hypertrophy. Left ventricular diastolic parameters are indeterminate.  2. Right ventricular systolic function is normal. The right ventricular size is normal. There is moderately elevated pulmonary artery systolic pressure. The estimated right ventricular systolic pressure is 54.9 mmHg.  3. Left atrial size was severely dilated.  4. The mitral valve has been repaired/replaced. No evidence of mitral valve regurgitation. Moderate to  severe mitral stenosis. The mean mitral valve gradient is 11.7 mmHg at 89bpm. There is a 29 mm St. Jude tissue valve present in the mitral position. Procedure Date: 09/07/2012. Echo findings are consistent with normal structure and function of the mitral valve prosthesis.  5. The aortic valve is tricuspid. Aortic valve regurgitation is not visualized. Mild aortic valve sclerosis is present, with no evidence of aortic valve stenosis.  6. The inferior vena cava is dilated in size with <50% respiratory variability, suggesting right atrial pressure of 15 mmHg.  7. Compared to prior study 05/2018, the mean MV gradient of the MV tissue valve has increased from 8 to . FINDINGS  Left Ventricle: Left ventricular ejection fraction, by estimation, is 35 to 40%. The left ventricle has moderately decreased function. The left ventricle demonstrates global hypokinesis. Definity  contrast agent was given IV to delineate the left ventricular endocardial borders. The left ventricular internal cavity size was normal in size. There is mild concentric left ventricular hypertrophy. Left ventricular diastolic parameters are indeterminate. Right Ventricle: The right ventricular size is normal. No increase in right ventricular wall thickness. Right ventricular systolic function is normal. There is moderately elevated pulmonary artery systolic pressure. The tricuspid regurgitant velocity is 3.16 m/s, and with an assumed right atrial pressure of 15 mmHg, the estimated right ventricular systolic pressure is 54.9 mmHg. Left Atrium: Left atrial size was severely dilated. Right Atrium: Right atrial size was normal in size. Pericardium: There is no evidence of pericardial effusion. Mitral Valve: The mitral valve has been repaired/replaced. No evidence of mitral valve regurgitation. There is a 29 mm St. Jude tissue valve present in the mitral position. Procedure Date: 09/07/2012. Echo findings are consistent with normal structure and function of  the mitral valve prosthesis. Moderate to severe mitral valve stenosis. MV peak gradient, 28.7 mmHg. The mean mitral valve gradient is 11.7 mmHg. Tricuspid Valve: The tricuspid valve is normal in structure. Tricuspid valve regurgitation is mild . No evidence of tricuspid stenosis. Aortic Valve: The aortic valve is tricuspid. Aortic valve regurgitation is not visualized. Mild aortic valve sclerosis is present, with no evidence of aortic valve stenosis. Pulmonic Valve: The pulmonic valve was normal in structure. Pulmonic valve regurgitation is trivial. No evidence of pulmonic stenosis. Aorta: The aortic root is normal in size and structure. Venous: The inferior vena cava is dilated in size with less than 50% respiratory variability, suggesting right atrial pressure of 15 mmHg. IAS/Shunts: No atrial level shunt detected by color flow Doppler.  LEFT VENTRICLE PLAX 2D LVIDd:         5.42 cm LVIDs:         4.46 cm LV PW:         1.22 cm LV IVS:        1.30 cm LVOT diam:     2.50 cm LV SV:         59 LV SV Index:   26 LVOT Area:     4.91 cm  RIGHT VENTRICLE TAPSE (M-mode): 1.0 cm LEFT ATRIUM              Index       RIGHT ATRIUM           Index LA diam:        5.10 cm  2.28 cm/m  RA Area:     20.30 cm LA Vol (A2C):   121.0 ml 54.20 ml/m RA Volume:   59.20 ml  26.52 ml/m LA Vol (A4C):   114.0 ml 51.06 ml/m LA Biplane Vol: 123.0 ml 55.10 ml/m  AORTIC VALVE LVOT Vmax:   72.10 cm/s LVOT Vmean:  48.067 cm/s LVOT VTI:    0.120 m  AORTA Ao Root diam: 3.60 cm MITRAL VALVE              TRICUSPID VALVE MV Area (PHT): 2.16 cm   TR Peak grad:   39.9 mmHg MV Peak grad:  28.7 mmHg  TR Vmax:        316.00 cm/s MV Mean grad:  11.7 mmHg MV Vmax:       2.68 m/s   SHUNTS MV Vmean:      159.3 cm/s Systemic VTI:  0.12 m  Systemic Diam: 2.50 cm Armanda Magic MD Electronically signed by Armanda Magic MD Signature Date/Time: 03/18/2020/4:27:42 PM    Final      CBC Recent Labs  Lab 03/16/20 1022 03/17/20 0449  03/19/20 0840  WBC 16.7* 9.9 6.0  HGB 11.7* 11.2* 11.3*  HCT 38.8* 37.6* 38.0*  PLT 148* 116* 108*  MCV 96.0 98.4 97.2  MCH 29.0 29.3 28.9  MCHC 30.2 29.8* 29.7*  RDW 17.1* 17.3* 16.5*  LYMPHSABS 0.5*  --   --   MONOABS 1.2*  --   --   EOSABS 0.0  --   --   BASOSABS 0.0  --   --     Chemistries  Recent Labs  Lab 03/16/20 1022 03/17/20 0449 03/19/20 0840  NA 141 141 136  K 4.2 4.0 4.4  CL 99 104 100  CO2 32 30 29  GLUCOSE 120* 82 156*  BUN 24* 25* 30*  CREATININE 1.04 0.87 0.72  CALCIUM 8.6* 8.3* 8.3*  AST 15  --   --   ALT 10  --   --   ALKPHOS 75  --   --   BILITOT 0.9  --   --    ------------------------------------------------------------------------------------------------------------------ No results for input(s): CHOL, HDL, LDLCALC, TRIG, CHOLHDL, LDLDIRECT in the last 72 hours.  Lab Results  Component Value Date   HGBA1C 5.6 03/01/2020   ------------------------------------------------------------------------------------------------------------------ No results for input(s): TSH, T4TOTAL, T3FREE, THYROIDAB in the last 72 hours.  Invalid input(s): FREET3 ------------------------------------------------------------------------------------------------------------------ No results for input(s): VITAMINB12, FOLATE, FERRITIN, TIBC, IRON, RETICCTPCT in the last 72 hours.  Coagulation profile Recent Labs  Lab 03/16/20 1022  INR 2.3*    No results for input(s): DDIMER in the last 72 hours.  Cardiac Enzymes No results for input(s): CKMB, TROPONINI, MYOGLOBIN in the last 168 hours.  Invalid input(s): CK ------------------------------------------------------------------------------------------------------------------    Component Value Date/Time   BNP 368.9 (H) 06/03/2019 2149     Vonzella Nipple M.D on 03/20/2020 at 8:45 AM  Go to [email or web address] - for contact info  [facility] - Office  [phone]

## 2020-03-21 ENCOUNTER — Inpatient Hospital Stay (HOSPITAL_COMMUNITY): Payer: Medicare Other | Admitting: Anesthesiology

## 2020-03-21 ENCOUNTER — Inpatient Hospital Stay (HOSPITAL_COMMUNITY): Payer: Medicare Other

## 2020-03-21 DIAGNOSIS — G9341 Metabolic encephalopathy: Secondary | ICD-10-CM

## 2020-03-21 DIAGNOSIS — B954 Other streptococcus as the cause of diseases classified elsewhere: Secondary | ICD-10-CM

## 2020-03-21 DIAGNOSIS — E118 Type 2 diabetes mellitus with unspecified complications: Secondary | ICD-10-CM

## 2020-03-21 DIAGNOSIS — J690 Pneumonitis due to inhalation of food and vomit: Secondary | ICD-10-CM

## 2020-03-21 DIAGNOSIS — B955 Unspecified streptococcus as the cause of diseases classified elsewhere: Secondary | ICD-10-CM

## 2020-03-21 DIAGNOSIS — R7881 Bacteremia: Secondary | ICD-10-CM

## 2020-03-21 DIAGNOSIS — J9809 Other diseases of bronchus, not elsewhere classified: Secondary | ICD-10-CM

## 2020-03-21 LAB — COMPREHENSIVE METABOLIC PANEL
ALT: 17 U/L (ref 0–44)
AST: 18 U/L (ref 15–41)
Albumin: 2.6 g/dL — ABNORMAL LOW (ref 3.5–5.0)
Alkaline Phosphatase: 73 U/L (ref 38–126)
Anion gap: 6 (ref 5–15)
BUN: 31 mg/dL — ABNORMAL HIGH (ref 8–23)
CO2: 32 mmol/L (ref 22–32)
Calcium: 9 mg/dL (ref 8.9–10.3)
Chloride: 106 mmol/L (ref 98–111)
Creatinine, Ser: 0.62 mg/dL (ref 0.61–1.24)
GFR calc Af Amer: >60 mL/min (ref >60)
GFR calc non Af Amer: >60 mL/min (ref >60)
Glucose, Bld: 145 mg/dL — ABNORMAL HIGH (ref 70–99)
Potassium: 5 mmol/L (ref 3.5–5.1)
Sodium: 144 mmol/L (ref 135–145)
Total Bilirubin: 0.8 mg/dL (ref 0.3–1.2)
Total Protein: 5.8 g/dL — ABNORMAL LOW (ref 6.5–8.1)

## 2020-03-21 LAB — CBC
HCT: 40.4 % (ref 39.0–52.0)
Hemoglobin: 11.8 g/dL — ABNORMAL LOW (ref 13.0–17.0)
MCH: 28.7 pg (ref 26.0–34.0)
MCHC: 29.2 g/dL — ABNORMAL LOW (ref 30.0–36.0)
MCV: 98.3 fL (ref 80.0–100.0)
Platelets: 146 10*3/uL — ABNORMAL LOW (ref 150–400)
RBC: 4.11 MIL/uL — ABNORMAL LOW (ref 4.22–5.81)
RDW: 16.1 % — ABNORMAL HIGH (ref 11.5–15.5)
WBC: 5.9 10*3/uL (ref 4.0–10.5)
nRBC: 0 % (ref 0.0–0.2)

## 2020-03-21 LAB — PHOSPHORUS
Phosphorus: 3.2 mg/dL (ref 2.5–4.6)
Phosphorus: 2.3 mg/dL — ABNORMAL LOW (ref 2.5–4.6)

## 2020-03-21 LAB — BLOOD GAS, ARTERIAL
Acid-Base Excess: 6.5 mmol/L — ABNORMAL HIGH (ref 0.0–2.0)
Bicarbonate: 29.5 mmol/L — ABNORMAL HIGH (ref 20.0–28.0)
FIO2: 100
O2 Saturation: 97.7 %
Patient temperature: 36.9
pCO2 arterial: 53.6 mmHg — ABNORMAL HIGH (ref 32.0–48.0)
pH, Arterial: 7.385 (ref 7.350–7.450)
pO2, Arterial: 103 mmHg (ref 83.0–108.0)

## 2020-03-21 LAB — MAGNESIUM
Magnesium: 2.3 mg/dL (ref 1.7–2.4)
Magnesium: 2.3 mg/dL (ref 1.7–2.4)

## 2020-03-21 LAB — CULTURE, BLOOD (ROUTINE X 2)
Culture: NO GROWTH
Special Requests: ADEQUATE

## 2020-03-21 LAB — GLUCOSE, CAPILLARY
Glucose-Capillary: 153 mg/dL — ABNORMAL HIGH (ref 70–99)
Glucose-Capillary: 153 mg/dL — ABNORMAL HIGH (ref 70–99)
Glucose-Capillary: 144 mg/dL — ABNORMAL HIGH (ref 70–99)
Glucose-Capillary: 116 mg/dL — ABNORMAL HIGH (ref 70–99)

## 2020-03-21 LAB — MRSA PCR SCREENING: MRSA by PCR: NEGATIVE

## 2020-03-21 MED ORDER — DOCUSATE SODIUM 50 MG/5ML PO LIQD
100.0000 mg | Freq: Two times a day (BID) | ORAL | Status: DC
  Administered 2020-03-21 – 2020-03-29 (×7): 100 mg via ORAL
  Filled 2020-03-21 (×10): qty 10

## 2020-03-21 MED ORDER — POLYETHYLENE GLYCOL 3350 17 G PO PACK
17.0000 g | PACK | Freq: Every day | ORAL | Status: DC
  Administered 2020-03-22 – 2020-03-27 (×2): 17 g via ORAL
  Filled 2020-03-21 (×2): qty 1

## 2020-03-21 MED ORDER — PANTOPRAZOLE SODIUM 40 MG PO PACK
40.0000 mg | PACK | ORAL | Status: DC
  Administered 2020-03-21 – 2020-03-29 (×7): 40 mg
  Filled 2020-03-21 (×8): qty 20

## 2020-03-21 MED ORDER — SUCCINYLCHOLINE CHLORIDE 20 MG/ML IJ SOLN
INTRAMUSCULAR | Status: DC | PRN
  Administered 2020-03-21: 120 mg via INTRAVENOUS

## 2020-03-21 MED ORDER — CHLORHEXIDINE GLUCONATE CLOTH 2 % EX PADS
6.0000 | MEDICATED_PAD | Freq: Every day | CUTANEOUS | Status: DC
  Administered 2020-03-21 – 2020-03-29 (×9): 6 via TOPICAL

## 2020-03-21 MED ORDER — IPRATROPIUM-ALBUTEROL 0.5-2.5 (3) MG/3ML IN SOLN
3.0000 mL | Freq: Four times a day (QID) | RESPIRATORY_TRACT | Status: DC
  Administered 2020-03-21 – 2020-03-30 (×35): 3 mL via RESPIRATORY_TRACT
  Filled 2020-03-21 (×5): qty 3
  Filled 2020-03-21: qty 6
  Filled 2020-03-21 (×25): qty 3

## 2020-03-21 MED ORDER — ORAL CARE MOUTH RINSE
15.0000 mL | OROMUCOSAL | Status: DC
  Administered 2020-03-21 – 2020-03-24 (×30): 15 mL via OROMUCOSAL

## 2020-03-21 MED ORDER — PROSOURCE TF PO LIQD
45.0000 mL | Freq: Two times a day (BID) | ORAL | Status: DC
  Administered 2020-03-21 – 2020-03-22 (×2): 45 mL
  Filled 2020-03-21 (×2): qty 45

## 2020-03-21 MED ORDER — ROSUVASTATIN CALCIUM 5 MG PO TABS
5.0000 mg | ORAL_TABLET | Freq: Every day | ORAL | Status: DC
  Administered 2020-03-22 – 2020-03-30 (×9): 5 mg
  Filled 2020-03-21 (×9): qty 1

## 2020-03-21 MED ORDER — PROPOFOL 10 MG/ML IV BOLUS
INTRAVENOUS | Status: DC | PRN
  Administered 2020-03-21: 50 mg via INTRAVENOUS

## 2020-03-21 MED ORDER — METOCLOPRAMIDE HCL 10 MG PO TABS
5.0000 mg | ORAL_TABLET | Freq: Three times a day (TID) | ORAL | Status: DC
  Administered 2020-03-21 – 2020-03-30 (×21): 5 mg
  Filled 2020-03-21 (×21): qty 1

## 2020-03-21 MED ORDER — FUROSEMIDE 10 MG/ML IJ SOLN
20.0000 mg | Freq: Once | INTRAMUSCULAR | Status: AC
  Administered 2020-03-21: 20 mg via INTRAVENOUS

## 2020-03-21 MED ORDER — MIDAZOLAM HCL 2 MG/2ML IJ SOLN: 1.0000 mg | INTRAMUSCULAR | Status: DC | PRN

## 2020-03-21 MED ORDER — VITAL HIGH PROTEIN PO LIQD
1000.0000 mL | ORAL | Status: DC
  Administered 2020-03-21 – 2020-03-23 (×3): 1000 mL

## 2020-03-21 MED ORDER — FUROSEMIDE 10 MG/ML IJ SOLN
INTRAMUSCULAR | Status: AC
  Filled 2020-03-21: qty 2

## 2020-03-21 MED ORDER — PROPOFOL 1000 MG/100ML IV EMUL
INTRAVENOUS | Status: AC
  Administered 2020-03-21: 10 ug/kg/min
  Filled 2020-03-21: qty 100

## 2020-03-21 MED ORDER — ASCORBIC ACID 500 MG PO TABS
500.0000 mg | ORAL_TABLET | Freq: Every day | ORAL | Status: DC
  Administered 2020-03-22 – 2020-03-30 (×9): 500 mg
  Filled 2020-03-21 (×9): qty 1

## 2020-03-21 MED ORDER — ACETYLCYSTEINE 20 % IN SOLN
2.0000 mL | Freq: Two times a day (BID) | RESPIRATORY_TRACT | Status: DC
  Administered 2020-03-21 – 2020-03-23 (×6): 2 mL via RESPIRATORY_TRACT
  Filled 2020-03-21 (×5): qty 4

## 2020-03-21 MED ORDER — ACETYLCYSTEINE 10 % IN SOLN
4.0000 mL | Freq: Two times a day (BID) | RESPIRATORY_TRACT | Status: DC
  Filled 2020-03-21 (×2): qty 4

## 2020-03-21 MED ORDER — INSULIN ASPART 100 UNIT/ML ~~LOC~~ SOLN
0.0000 [IU] | SUBCUTANEOUS | Status: DC
  Administered 2020-03-21: 3 [IU] via SUBCUTANEOUS
  Administered 2020-03-22: 4 [IU] via SUBCUTANEOUS
  Administered 2020-03-22 (×2): 3 [IU] via SUBCUTANEOUS
  Administered 2020-03-22 (×2): 4 [IU] via SUBCUTANEOUS
  Administered 2020-03-22: 3 [IU] via SUBCUTANEOUS
  Administered 2020-03-23 (×5): 4 [IU] via SUBCUTANEOUS
  Administered 2020-03-24 (×5): 3 [IU] via SUBCUTANEOUS
  Administered 2020-03-24 – 2020-03-26 (×2): 4 [IU] via SUBCUTANEOUS
  Administered 2020-03-27 – 2020-03-28 (×4): 3 [IU] via SUBCUTANEOUS
  Administered 2020-03-28: 4 [IU] via SUBCUTANEOUS
  Administered 2020-03-28 – 2020-03-29 (×4): 3 [IU] via SUBCUTANEOUS
  Administered 2020-03-29: 4 [IU] via SUBCUTANEOUS
  Administered 2020-03-29 – 2020-03-30 (×3): 3 [IU] via SUBCUTANEOUS
  Administered 2020-03-30: 4 [IU] via SUBCUTANEOUS

## 2020-03-21 MED ORDER — FENTANYL CITRATE (PF) 100 MCG/2ML IJ SOLN: 25.0000 ug | INTRAMUSCULAR | Status: DC | PRN

## 2020-03-21 MED ORDER — ACETAMINOPHEN 325 MG PO TABS
325.0000 mg | ORAL_TABLET | Freq: Four times a day (QID) | ORAL | Status: DC | PRN
  Administered 2020-03-29: 325 mg
  Filled 2020-03-21: qty 1

## 2020-03-21 MED ORDER — CHLORHEXIDINE GLUCONATE 0.12% ORAL RINSE (MEDLINE KIT)
15.0000 mL | Freq: Two times a day (BID) | OROMUCOSAL | Status: DC
  Administered 2020-03-21 – 2020-03-24 (×6): 15 mL via OROMUCOSAL

## 2020-03-21 MED ORDER — PROPOFOL 1000 MG/100ML IV EMUL
5.0000 ug/kg/min | INTRAVENOUS | Status: DC
  Administered 2020-03-22: 30 ug/kg/min via INTRAVENOUS
  Administered 2020-03-22 (×2): 20 ug/kg/min via INTRAVENOUS
  Administered 2020-03-23: 15 ug/kg/min via INTRAVENOUS
  Administered 2020-03-23: 20 ug/kg/min via INTRAVENOUS
  Administered 2020-03-24: 20.023 ug/kg/min via INTRAVENOUS
  Filled 2020-03-21 (×9): qty 100

## 2020-03-21 NOTE — Progress Notes (Signed)
Critical Care note: Patient was noted to be gurgling, rhonchorous.  Requested respiratory therapy for deep suctioning.  However, patient desaturated.  Rapid response was called.  Placed on noninvasive ventilation with BiPAP.  Discussed with the patient's daughter over the phone and updated her about the patient's condition.  She wanted the patient to be a full code at this time.  Patient transferred to the ICU.  Consulted anesthesia for intubation.  Initially plan was to consult surgery for possible chest tube on the left side because the patient was not moving air on the left side likely secondary to the effusion/atelectasis and possible mucous plugging.  However discussed with pulmonary critical care Dr. Craige Cotta and he suggested for the patient to be intubated and for bedside bronchoscopy with suctioning of the mucous plugging.  Called the patient's daughter back and updated her about the patient's condition.  At this time he remains full code.  However, daughter agreeable to palliative consult.  Therefore also consulted palliative care.  Appreciate help by anesthesia and pulmonary/critical care. Critical care time spent 1 hour.

## 2020-03-21 NOTE — Progress Notes (Signed)
RT transported patient to and from CT with no complications noted, RN at bedside.

## 2020-03-21 NOTE — Progress Notes (Signed)
   03/21/20 1303  Assess: MEWS Score  Temp 98.4 F (36.9 C)  BP (!) 176/82 (notified MD)  Pulse Rate 98  Resp (!) 28 (notified MD)  SpO2 95 %  O2 Device Bi-PAP  Patient Activity (if Appropriate) In bed  FiO2 (%) 100 %  Assess: MEWS Score  MEWS Temp 0  MEWS Systolic 0  MEWS Pulse 0  MEWS RR 2  MEWS LOC 1  MEWS Score 3  MEWS Score Color Yellow  Assess: if the MEWS score is Yellow or Red  Were vital signs taken at a resting state? Yes  Focused Assessment Change from prior assessment (see assessment flowsheet)  Early Detection of Sepsis Score *See Row Information* Medium  MEWS guidelines implemented *See Row Information* Yes  Treat  MEWS Interventions Administered scheduled meds/treatments;Escalated (See documentation below);Consulted Respiratory Therapy  Take Vital Signs  Increase Vital Sign Frequency  Yellow: Q 2hr X 2 then Q 4hr X 2, if remains yellow, continue Q 4hrs  Escalate  MEWS: Escalate Yellow: discuss with charge nurse/RN and consider discussing with provider and RRT  Notify: Charge Nurse/RN  Name of Charge Nurse/RN Notified tiffany vogler, RN (RN documenting is Consulting civil engineer today)  Date Charge Nurse/RN Notified 03/21/20  Time Charge Nurse/RN Notified 1306  Notify: Provider  Provider Name/Title dr Macky Lower  Date Provider Notified 03/21/20  Time Provider Notified 1306  Notification Type Page  Notification Reason Change in status  Response See new orders  Date of Provider Response 03/21/20  Time of Provider Response 1306  Notify: Rapid Response  Name of Rapid Response RN Notified tiffany vogler, RN  Date Rapid Response Notified 03/21/20  Time Rapid Response Notified 1307  Document  Patient Outcome Transferred/level of care increased

## 2020-03-21 NOTE — Anesthesia Procedure Notes (Signed)
Procedure Name: Intubation Date/Time: 03/21/2020 1:55 PM Performed by: Molli Barrows, MD Pre-anesthesia Checklist: Patient identified, Emergency Drugs available, Suction available and Patient being monitored Patient Re-evaluated:Patient Re-evaluated prior to induction Preoxygenation: Pre-oxygenation with 100% oxygen Induction Type: IV induction Laryngoscope Size: Glidescope and 4 Grade View: Grade I Tube type: Oral Tube size: 8.0 mm Number of attempts: 1 Airway Equipment and Method: Stylet Placement Confirmation: ETT inserted through vocal cords under direct vision,  positive ETCO2 and breath sounds checked- equal and bilateral Secured at: 27 cm Tube secured with: Tape Dental Injury: Teeth and Oropharynx as per pre-operative assessment
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
## 2020-03-21 NOTE — Progress Notes (Addendum)
Patient Demographics:    Cody Hale, is a 69 y.o. male, DOB - [birth year], ZOX:[id number]  Admit date - 03/16/2020   Admitting Physician Courage Mariea Clonts, MD  Outpatient Primary MD for the patient is [name], MD  LOS - 5  Chief Complaint  Patient presents with  . Fever        Subjective:    Cody Hale is more awake but having rhonchorous breathing with increased secretions.   Assessment  & Plan :    Principal Problem:   Sepsis due to pneumonia Active Problems:   OSA (obstructive sleep apnea)   Essential hypertension, malignant   Type II diabetes mellitus with manifestations (HCC)   Chronic systolic CHF (congestive heart failure) (HCC)   COPD (chronic obstructive pulmonary disease) with chronic bronchitis (HCC)   Chronic atrial fibrillation   S/P mitral valve replacement with bioprosthetic valve   Major depressive disorder, recurrent severe without psychotic features (HCC)   Chronic respiratory failure with hypoxia (HCC)   PNA (pneumonia)   Streptococcus G Bacteremia  Brief Summary:- 69 y.o. 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, chronic atrial fibrillation with chronic anticoagulation, COPD, diabetes mellitus , h/o T11/L4 fusion after lumbar fracture 11/17/20withWound dehiscence noted12/7/20, foot wound Prior history of dysphagia requiring PEG tube placement 06/04/2019 chronic hypoxic respiratory failure admitted on 03/17/2019 from [facility] with acute on chronic hypoxic respiratory failure secondary to community-acquired pneumonia -Prior H/o Bacterial Endocarditis --s/p Mitral Valve Repair Previously Now has Strep group G Bacteremia---Needs TEE  A/p 1)Community-acquired Pneumonia---  --In the ED chest x-ray was suggestive of pulmonary venous congestion Versus  pneumonia -However given fevers, leukocytosis cough and dyspnea and hypoxia favor pneumonia -On IV Rocephin and azithromycin, bronchodilators and mucolytics -Repeat chest x-ray yesterday showed stable complete opacification of the left hemithorax likely combination of effusion and atelectasis. -Per interventional radiology thoracentesis could not be done yesterday because they were unable to position the patient favorably given his severe debility and weakness. -He continues to be on oxygen, noted to have rhonchorous breath sounds this morning, very high suspicion for aspiration.  Consulted speech/swallow evaluation. -Ordered chest CT to better evaluate.  2)Strep Group G Sepsis --patient had leukocytosis, tachypnea, tachycardia and fevers --- as well as confusion and hypoxia -- suspect secondary to #1 above -UA not suggestive of UTI -Patient also has right foot wound and sacral wounds do not look particularly infected--please see photos in epic --Lactic acid is not elevated -Antibiotics as above #1 blood cultures from 03/16/20 with strep group G,--patient with prior history of endocarditis repeat TTE from 03/18/2020 noted  repeat blood cultures on 03/19/2020 no growth so far. -WBC 16.7 >>9.9>>6.0>>5.9 -Prior H/o Bacterial Endocarditis --s/p Mitral Valve Repair Previously Now has Strep group G Bacteremia---Needs TEE -If TEE is negative patient may be discharged home on p.o. Keflex to complete 2 weeks of antibiotic therapy for bacteremia (2 weeks from first negative culture) --Phone consult with Dr. Johny Sax from infectious disease 9/14: Unable to do transesophageal echocardiogram because of his respiratory status.  3) chronic anemia----hemoglobin currently above 11 which is higher than recent baseline, -No bleeding concerns at this time  4) chronic atrial fibrillation   --  on anticoagulation with Xarelto  5)Bioprosthetic Mitral Valve Replacement after Endocarditis in 2014--repeat echo  from 03/18/20 showed---mitral valve has been repaired/replaced. No evidence of mitral valve regurgitation. Moderate to severe mitral stenosis. The mean mitral valve gradient is 11.7 mmHg at 89bpm. There is a 29 mm St. Jude tissue valve present in the mitral position.  --Cardiology consult for possible TEE requested due to strep group G bacteremia -Unable to do TEE secondary to his respiratory status.  6)DM2- Use Novolog/Humalog Sliding scale insulin with Accu-Cheks/Fingersticks as ordered   7) decubitus ulcers--mostly on the buttocks and right foot----wound care consult appreciated, recommendations noted  8) acute on chronic hypoxic respiratory failure--- secondary to #1 above, repeat chest x-ray on 03/20/2020 - On 5 L of oxygen via nasal cannula -He is having gurgling, rhonchorous breath sounds.  Very high suspicion for ongoing aspiration.  Discussed with nursing staff.  Will request respiratory consult for suctioning.  Ordered chest CT.  Disposition/Need for in-Hospital Stay- patient unable to be discharged at this time due to --pneumonia/sepsis strep group G bacteremia requiring IV antibiotics.  Also has very high suspicion for ongoing aspiration, acute respiratory failure. Status is: Inpatient  Remains inpatient appropriate because:pneumonia/sepsis strep group G bacteremia requiring IV antibiotics.  Dispo: The patient is from: ALF  Anticipated d/c is to:  [facility]  Anticipated d/c date is: Unknown at this time.  Patient currently is not medically stable to d/c. Barriers: Not Clinically Stable-pneumonia/sepsis strep group G bacteremia requiring IV antibiotics.  Also having acute hypoxemic respiratory failure, requiring oxygen.  Very high suspicion for aspiration.  Code Status : full  Family Communication: Discussed with the patient's daughter on phone at [phone].  Explained to her about the high suspicion for aspiration and his risk for  worsening respiratory failure.  Daughter at this time wishes for the patient to be full code.  Consults  :  Phone consult with ID and Cardiologist DVT Prophylaxis  :  Xarelto- SCDs   Lab Results  Component Value Date   PLT 146 (L) 03/21/2020    Inpatient Medications  Scheduled Meds: . ascorbic acid  500 mg Oral Daily  . carvedilol  3.125 mg Per Tube BID WC  . feeding supplement (ENSURE ENLIVE)   Oral BID  . ferrous sulfate  325 mg Oral BID WC  . fluticasone furoate-vilanterol  1 puff Inhalation Daily   And  . umeclidinium bromide  1 puff Inhalation Daily  . Gerhardt's butt cream   Topical QID  . insulin aspart  0-5 Units Subcutaneous QHS  . insulin aspart  0-6 Units Subcutaneous TID WC  . magnesium oxide  400 mg Oral BID  . metoCLOPramide  5 mg Oral TID AC  . multivitamin with minerals  1 tablet Per Tube Daily  . pantoprazole  40 mg Oral Daily  . rivaroxaban  20 mg Per Tube Q supper  . rosuvastatin  5 mg Oral Daily  . sodium chloride flush  3 mL Intravenous Q12H  . tamsulosin  0.4 mg Oral QPC breakfast  . Vilazodone HCl  40 mg Oral Daily   Continuous Infusions: . sodium chloride    . sodium chloride 20 mL/hr at 03/19/20 1315  . azithromycin 500 mg (03/20/20 1222)  . cefTRIAXone (ROCEPHIN)  IV 2 g (03/20/20 0906)   PRN Meds:.sodium chloride, acetaminophen, albuterol, ondansetron **OR** ondansetron (ZOFRAN) IV, oxyCODONE, polyethylene glycol, sodium chloride flush    Anti-infectives (From admission, onward)   Start     Dose/Rate Route Frequency  Ordered Stop   03/17/20 0830  cefTRIAXone (ROCEPHIN) 2 g in sodium chloride 0.9 % 100 mL IVPB        2 g 200 mL/hr over 30 Minutes Intravenous Every 24 hours 03/17/20 0813     03/16/20 1945  cefTRIAXone (ROCEPHIN) 1 g in sodium chloride 0.9 % 100 mL IVPB  Status:  Discontinued        1 g 200 mL/hr over 30 Minutes Intravenous Every 24 hours 03/16/20 1933 03/17/20 0813   03/16/20 1945  azithromycin (ZITHROMAX) 500 mg in sodium  chloride 0.9 % 250 mL IVPB        500 mg 250 mL/hr over 60 Minutes Intravenous Every 24 hours 03/16/20 1933     03/16/20 1115  cefTRIAXone (ROCEPHIN) 1 g in sodium chloride 0.9 % 100 mL IVPB        1 g 200 mL/hr over 30 Minutes Intravenous  Once 03/16/20 1103 03/16/20 1210   03/16/20 1115  azithromycin (ZITHROMAX) 500 mg in sodium chloride 0.9 % 250 mL IVPB        500 mg 250 mL/hr over 60 Minutes Intravenous  Once 03/16/20 1103 03/16/20 1326        Objective:   Vitals:   03/20/20 2008 03/20/20 2058 03/21/20 0509 03/21/20 0801  BP:  (!) 113/54 124/68   Pulse: 94 68 85   Resp: 18 (!) 24 (!) 24   Temp:  97.9 F (36.6 C) (!) 97.5 F (36.4 C)   TempSrc:  Oral Oral   SpO2: 95% 93% 92% 91%  Weight:      Height:        Wt Readings from Last 3 Encounters:  03/18/20 115.4 kg  03/01/20 110.7 kg  02/17/20 102.1 kg     Intake/Output Summary (Last 24 hours) at 03/21/2020 0837 Last data filed at 03/21/2020 0512 Gross per 24 hour  Intake 0 ml  Output 1150 ml  Net -1150 ml    Physical Exam Gen: Lethargic but more awake than yesterday HEENT:- [place].AT, No sclera icterus Nose- [place] 5 L/min Neck-Supple Neck,No JVD,.  Lungs-diminished breath sounds especially on the left side, coarse breath sounds, scattered rhonchi, no wheezing  CV- S1, S2 normal, regular , 3/6 SM Abd-  +ve B.Sounds, Abd Soft, No tenderness,    Psych-affect is appropriate, oriented x3 Neurological -lethargic but arousable, following few commands extremities -chronic lower extremity pedal edema noted, intact peripheral pulses  Skin - warm, dry--right foot and sacral decubitus please see photos in epic   Data Review:   Micro Results Recent Results (from the past 240 hour(s))  Urine culture     Status: Abnormal   Collection Time: 03/16/20  7:39 AM   Specimen: Urine, Catheterized  Result Value Ref Range Status   Specimen Description   Final    URINE, CATHETERIZED Performed at [facility], [address],  [place], [place] [address]    Special Requests   Final    NONE Performed at [facility], [address], [place], [place] [address]    Culture MULTIPLE SPECIES PRESENT, SUGGEST RECOLLECTION (A)  Final   Report Status 03/17/2020 FINAL  Final  SARS Coronavirus 2 by RT PCR (hospital order, performed in [facility] hospital lab) Nasopharyngeal Nasopharyngeal Swab     Status: None   Collection Time: 03/16/20 10:03 AM   Specimen: Nasopharyngeal Swab  Result Value Ref Range Status   SARS Coronavirus 2 NEGATIVE NEGATIVE Final    Comment: (NOTE) SARS-CoV-2 target nucleic acids are  NOT DETECTED.  The SARS-CoV-2 RNA is generally detectable in upper and lower respiratory specimens during the acute phase of infection. The lowest concentration of SARS-CoV-2 viral copies this assay can detect is 250 copies / mL. A negative result does not preclude SARS-CoV-2 infection and should not be used as the sole basis for treatment or other patient management decisions.  A negative result may occur with improper specimen collection / handling, submission of specimen other than nasopharyngeal swab, presence of viral mutation(s) within the areas targeted by this assay, and inadequate number of viral copies (<250 copies / mL). A negative result must be combined with clinical observations, patient history, and epidemiological information.  Fact Sheet for Patients:   BoilerBrush.com.cy  Fact Sheet for Healthcare Providers: https://pope.com/  This test is not yet approved or  cleared by the [place] FDA and has been authorized for detection and/or diagnosis of SARS-CoV-2 by FDA under an Emergency Use Authorization (EUA).  This EUA will remain in effect (meaning this test can be used) for the duration of the COVID-19 declaration under Section 564(b)(1) of the Act, 21 U.S.C. section 360bbb-3(b)(1), unless the authorization is terminated or revoked  sooner.  Performed at [facility], [address] [place], [place] [address]   Blood Culture (routine x 2)     Status: Abnormal   Collection Time: 03/16/20 10:22 AM   Specimen: BLOOD  Result Value Ref Range Status   Specimen Description   Final    BLOOD LEFT ANTECUBITAL Performed at [facility], [address], [place], [place] [address]    Special Requests   Final    BOTTLES DRAWN AEROBIC ONLY Blood Culture adequate volume Performed at [facility], [address], [place], [place] [address]    Culture  Setup Time   Final    AEROBIC BOTTLE ONLY GRAM POSITIVE COCCI IN CHAINS Gram Stain Report Called to,Read Back By and Verified With: C THOMAS,RN@[id number]  03/17/20 MKELLY Organism ID to follow CRITICAL RESULT CALLED TO, READ BACK BY AND VERIFIED WITH: T MAYO RN 03/17/20 0618 JDW Performed at [facility], [address], [place], [place] [address]    Culture STREPTOCOCCUS GROUP G (A)  Final   Report Status 03/19/2020 FINAL  Final   Organism ID, Bacteria STREPTOCOCCUS GROUP G  Final      Susceptibility   Streptococcus group g - MIC*    CLINDAMYCIN <=0.25 SENSITIVE Sensitive     AMPICILLIN <=0.25 SENSITIVE Sensitive     ERYTHROMYCIN <=0.12 SENSITIVE Sensitive     VANCOMYCIN <=0.12 SENSITIVE Sensitive     CEFTRIAXONE <=0.12 SENSITIVE Sensitive     LEVOFLOXACIN 0.5 SENSITIVE Sensitive     PENICILLIN Value in next row Sensitive      SENSITIVE<=0.06    * STREPTOCOCCUS GROUP G  Blood Culture (routine x 2)     Status: None (Preliminary result)   Collection Time: 03/16/20 10:22 AM   Specimen: BLOOD  Result Value Ref Range Status   Specimen Description BLOOD LEFT HAND  Final   Special Requests   Final    BOTTLES DRAWN AEROBIC ONLY Blood Culture adequate volume   Culture   Final    NO GROWTH 4 DAYS Performed at [facility], [address] [place], [place] [address]    Report Status PENDING  Incomplete  Blood Culture ID Panel (Reflexed)     Status: Abnormal   Collection Time:  03/16/20 10:22 AM  Result Value Ref Range Status   Enterococcus faecalis NOT DETECTED NOT  DETECTED Final   Enterococcus Faecium NOT DETECTED NOT DETECTED Final   Listeria monocytogenes NOT DETECTED NOT DETECTED Final   Staphylococcus species NOT DETECTED NOT DETECTED Final   Staphylococcus aureus (BCID) NOT DETECTED NOT DETECTED Final   Staphylococcus epidermidis NOT DETECTED NOT DETECTED Final   Staphylococcus lugdunensis NOT DETECTED NOT DETECTED Final   Streptococcus species DETECTED (A) NOT DETECTED Final    Comment: Not Enterococcus species, Streptococcus agalactiae, Streptococcus pyogenes, or Streptococcus pneumoniae. CRITICAL RESULT CALLED TO, READ BACK BY AND VERIFIED WITH: T MAYO RN 03/17/20 0618 JDW    Streptococcus agalactiae NOT DETECTED NOT DETECTED Final   Streptococcus pneumoniae NOT DETECTED NOT DETECTED Final   Streptococcus pyogenes NOT DETECTED NOT DETECTED Final   A.calcoaceticus-baumannii NOT DETECTED NOT DETECTED Final   Bacteroides fragilis NOT DETECTED NOT DETECTED Final   Enterobacterales NOT DETECTED NOT DETECTED Final   Enterobacter cloacae complex NOT DETECTED NOT DETECTED Final   Escherichia coli NOT DETECTED NOT DETECTED Final   Klebsiella aerogenes NOT DETECTED NOT DETECTED Final   Klebsiella oxytoca NOT DETECTED NOT DETECTED Final   Klebsiella pneumoniae NOT DETECTED NOT DETECTED Final   Proteus species NOT DETECTED NOT DETECTED Final   Salmonella species NOT DETECTED NOT DETECTED Final   Serratia marcescens NOT DETECTED NOT DETECTED Final   Haemophilus influenzae NOT DETECTED NOT DETECTED Final   Neisseria meningitidis NOT DETECTED NOT DETECTED Final   Pseudomonas aeruginosa NOT DETECTED NOT DETECTED Final   Stenotrophomonas maltophilia NOT DETECTED NOT DETECTED Final   Candida albicans NOT DETECTED NOT DETECTED Final   Candida auris NOT DETECTED NOT DETECTED Final   Candida glabrata NOT DETECTED NOT DETECTED Final   Candida krusei NOT DETECTED NOT  DETECTED Final   Candida parapsilosis NOT DETECTED NOT DETECTED Final   Candida tropicalis NOT DETECTED NOT DETECTED Final   Cryptococcus neoformans/gattii NOT DETECTED NOT DETECTED Final    Comment: Performed at [facility], [address], [place], [place] [address]  MRSA PCR Screening     Status: None   Collection Time: 03/16/20  8:44 PM   Specimen: Nasopharyngeal  Result Value Ref Range Status   MRSA by PCR NEGATIVE NEGATIVE Final    Comment:        The GeneXpert MRSA Assay (FDA approved for NASAL specimens only), is one component of a comprehensive MRSA colonization surveillance program. It is not intended to diagnose MRSA infection nor to guide or monitor treatment for MRSA infections. Performed at [facility], [address], [place], [place] [address]   Culture, blood (Routine X 2) w Reflex to ID Panel     Status: None (Preliminary result)   Collection Time: 03/19/20  8:44 AM   Specimen: Right Antecubital; Blood  Result Value Ref Range Status   Specimen Description   Final    RIGHT ANTECUBITAL BOTTLES DRAWN AEROBIC AND ANAEROBIC   Special Requests Blood Culture adequate volume  Final   Culture   Final    NO GROWTH 1 DAY Performed at [facility], [address], [place], [place] [address]    Report Status PENDING  Incomplete  Culture, blood (Routine X 2) w Reflex to ID Panel     Status: None (Preliminary result)   Collection Time: 03/19/20  8:44 AM   Specimen: BLOOD RIGHT HAND  Result Value Ref Range Status   Specimen Description   Final    BLOOD RIGHT HAND BOTTLES DRAWN AEROBIC AND ANAEROBIC   Special Requests Blood Culture adequate volume  Final  Culture   Final    NO GROWTH 1 DAY Performed at [facility], [address], [place], [place] [address]    Report Status PENDING  Incomplete    Radiology Reports DG Chest 2 View  Result Date: 03/20/2020 CLINICAL DATA:  Shortness of breath. EXAM: CHEST - 2 VIEW COMPARISON:  March 19, 2020. FINDINGS:  There is stable complete opacification of left hemithorax most likely due to a combination of effusion and atelectasis. Sternotomy wires are noted. Stable right basilar opacity is noted concerning for edema or atelectasis. No pneumothorax is noted. IMPRESSION: Stable complete opacification of left hemithorax most likely due to combination of effusion and atelectasis. Stable right basilar opacity concerning for edema or atelectasis. Electronically Signed   By: Lupita Raider M.D.   On: 03/20/2020 14:41   [place] CHEST (PLEURAL EFFUSION)  Result Date: 03/20/2020 CLINICAL DATA:  Dyspnea. EXAM: CHEST ULTRASOUND - LIMITED COMPARISON:  Chest radiograph 03/20/2020 FINDINGS: Left chest sonography demonstrates a moderate left pleural effusion. We were unable to sit the patient up to perform thoracentesis. The patient was very stiff and even with several caregivers holding the patient, we were not able to hold him in a steady position at the site of the bed adequate to perform thoracentesis. There is too great of a risk of him falling or sliding during thoracentesis due to his rigidity in the hips and legs. IMPRESSION: 1. At least moderate left pleural effusion is visualized sonographically. Thoracentesis was not attempted as the patient could not be positioned adequately. Electronically Signed   By: Gaylyn Rong M.D.   On: 03/20/2020 15:00   DG CHEST PORT 1 VIEW  Result Date: 03/19/2020 CLINICAL DATA:  Worsening dyspnea today, COPD, diabetes mellitus, hypertension, negative COVID-19 test on 03/16/2020 EXAM: PORTABLE CHEST 1 VIEW COMPARISON:  Portable exam 1603 hours compared to 03/16/2020 FINDINGS: Opacification of LEFT hemithorax by pleural effusion and atelectasis increased from prior study. LEFT heart border obscured. Patchy infiltrates mid to lower RIGHT lung with minimal basilar atelectasis and probable small pleural effusion. No pneumothorax. Bones demineralized. IMPRESSION: Patchy RIGHT lung infiltrates with  basilar pleural effusion and atelectasis. Opacified LEFT hemithorax by combination of atelectasis and pleural effusion, underlying infiltrate not excluded. Electronically Signed   By: Ulyses Southward M.D.   On: 03/19/2020 16:21   DG Chest Port 1 View  Result Date: 03/16/2020 CLINICAL DATA:  Concern for sepsis. EXAM: PORTABLE CHEST 1 VIEW COMPARISON:  06/17/2019 chest radiograph and prior. FINDINGS: Left basilar/retrocardiac patchy opacities. No pneumothorax. Small left pleural effusion. Cardiomegaly and prominence of the central pulmonary vessels. Post sternotomy sequela. IMPRESSION: Cardiomegaly and central pulmonary vascular congestion. Left basilar opacities. Differential includes infection, edema and atelectasis. Electronically Signed   By: Stana Bunting M.D.   On: 03/16/2020 08:14   ECHOCARDIOGRAM COMPLETE  Result Date: 03/18/2020    ECHOCARDIOGRAM REPORT   Patient Name:   Cody Hale Date of Exam: 03/18/2020 Medical Rec #:  [id number]    Height:       69.0 in Accession #:    [id number]   Weight:       240.0 lb Date of Birth:  10/09/1950    BSA:          2.232 m Patient Age:    69 years     BP:           110/68 mmHg Patient Gender: M            HR:  67 bpm. [facility] Procedure: 2D Echo and Intracardiac Opacification Agent Indications:    Bacteremia R78.81  History:        Patient has prior history of Echocardiogram examinations, most                 recent 05/20/2019. CHF, COPD, Arrythmias:Atrial Fibrillation;                 Risk Factors:Hypertension and Diabetes.                  Mitral Valve: 29 mm St. Jude tissue valve valve is present in                 the mitral position. Procedure Date: 09/07/2012.  Sonographer:    Thurman Coyer RDCS (AE) Referring Phys: [id number] COURAGE EMOKPAE IMPRESSIONS  1. Left ventricular ejection fraction, by estimation, is 35 to 40%. The left ventricle has moderately decreased function. The left ventricle demonstrates global hypokinesis. There  is mild concentric left ventricular hypertrophy. Left ventricular diastolic parameters are indeterminate.  2. Right ventricular systolic function is normal. The right ventricular size is normal. There is moderately elevated pulmonary artery systolic pressure. The estimated right ventricular systolic pressure is 54.9 mmHg.  3. Left atrial size was severely dilated.  4. The mitral valve has been repaired/replaced. No evidence of mitral valve regurgitation. Moderate to severe mitral stenosis. The mean mitral valve gradient is 11.7 mmHg at 89bpm. There is a 29 mm St. Jude tissue valve present in the mitral position. Procedure Date: 09/07/2012. Echo findings are consistent with normal structure and function of the mitral valve prosthesis.  5. The aortic valve is tricuspid. Aortic valve regurgitation is not visualized. Mild aortic valve sclerosis is present, with no evidence of aortic valve stenosis.  6. The inferior vena cava is dilated in size with <50% respiratory variability, suggesting right atrial pressure of 15 mmHg.  7. Compared to prior study 05/2018, the mean MV gradient of the MV tissue valve has increased from 8 to . FINDINGS  Left Ventricle: Left ventricular ejection fraction, by estimation, is 35 to 40%. The left ventricle has moderately decreased function. The left ventricle demonstrates global hypokinesis. Definity contrast agent was given IV to delineate the left ventricular endocardial borders. The left ventricular internal cavity size was normal in size. There is mild concentric left ventricular hypertrophy. Left ventricular diastolic parameters are indeterminate. Right Ventricle: The right ventricular size is normal. No increase in right ventricular wall thickness. Right ventricular systolic function is normal. There is moderately elevated pulmonary artery systolic pressure. The tricuspid regurgitant velocity is 3.16 m/s, and with an assumed right atrial pressure of 15 mmHg, the estimated right  ventricular systolic pressure is 54.9 mmHg. Left Atrium: Left atrial size was severely dilated. Right Atrium: Right atrial size was normal in size. Pericardium: There is no evidence of pericardial effusion. Mitral Valve: The mitral valve has been repaired/replaced. No evidence of mitral valve regurgitation. There is a 29 mm St. Jude tissue valve present in the mitral position. Procedure Date: 09/07/2012. Echo findings are consistent with normal structure and function of the mitral valve prosthesis. Moderate to severe mitral valve stenosis. MV peak gradient, 28.7 mmHg. The mean mitral valve gradient is 11.7 mmHg. Tricuspid Valve: The tricuspid valve is normal in structure. Tricuspid valve regurgitation is mild . No evidence of tricuspid stenosis. Aortic Valve: The aortic valve is tricuspid. Aortic valve regurgitation is not visualized. Mild aortic valve sclerosis is present, with  no evidence of aortic valve stenosis. Pulmonic Valve: The pulmonic valve was normal in structure. Pulmonic valve regurgitation is trivial. No evidence of pulmonic stenosis. Aorta: The aortic root is normal in size and structure. Venous: The inferior vena cava is dilated in size with less than 50% respiratory variability, suggesting right atrial pressure of 15 mmHg. IAS/Shunts: No atrial level shunt detected by color flow Doppler.  LEFT VENTRICLE PLAX 2D LVIDd:         5.42 cm LVIDs:         4.46 cm LV PW:         1.22 cm LV IVS:        1.30 cm LVOT diam:     2.50 cm LV SV:         59 LV SV Index:   26 LVOT Area:     4.91 cm  RIGHT VENTRICLE TAPSE (M-mode): 1.0 cm LEFT ATRIUM              Index       RIGHT ATRIUM           Index LA diam:        5.10 cm  2.28 cm/m  RA Area:     20.30 cm LA Vol (A2C):   121.0 ml 54.20 ml/m RA Volume:   59.20 ml  26.52 ml/m LA Vol (A4C):   114.0 ml 51.06 ml/m LA Biplane Vol: 123.0 ml 55.10 ml/m  AORTIC VALVE LVOT Vmax:   72.10 cm/s LVOT Vmean:  48.067 cm/s LVOT VTI:    0.120 m  AORTA Ao Root diam: 3.60 cm  MITRAL VALVE              TRICUSPID VALVE MV Area (PHT): 2.16 cm   TR Peak grad:   39.9 mmHg MV Peak grad:  28.7 mmHg  TR Vmax:        316.00 cm/s MV Mean grad:  11.7 mmHg MV Vmax:       2.68 m/s   SHUNTS MV Vmean:      159.3 cm/s Systemic VTI:  0.12 m                           Systemic Diam: 2.50 cm Armanda Magic MD Electronically signed by Armanda Magic MD Signature Date/Time: 03/18/2020/4:27:42 PM    Final      CBC Recent Labs  Lab 03/16/20 1022 03/17/20 0449 03/19/20 0840 03/21/20 0554  WBC 16.7* 9.9 6.0 5.9  HGB 11.7* 11.2* 11.3* 11.8*  HCT 38.8* 37.6* 38.0* 40.4  PLT 148* 116* 108* 146*  MCV 96.0 98.4 97.2 98.3  MCH 29.0 29.3 28.9 28.7  MCHC 30.2 29.8* 29.7* 29.2*  RDW 17.1* 17.3* 16.5* 16.1*  LYMPHSABS 0.5*  --   --   --   MONOABS 1.2*  --   --   --   EOSABS 0.0  --   --   --   BASOSABS 0.0  --   --   --     Chemistries  Recent Labs  Lab 03/16/20 1022 03/17/20 0449 03/19/20 0840 03/21/20 0554  NA 141 141 136 144  K 4.2 4.0 4.4 5.0  CL 99 104 100 106  CO2 32 30 29 32  GLUCOSE 120* 82 156* 145*  BUN 24* 25* 30* 31*  CREATININE 1.04 0.87 0.72 0.62  CALCIUM 8.6* 8.3* 8.3* 9.0  AST 15  --   --  18  ALT 10  --   --  17  ALKPHOS 75  --   --  73  BILITOT 0.9  --   --  0.8   ------------------------------------------------------------------------------------------------------------------ No results for input(s): CHOL, HDL, LDLCALC, TRIG, CHOLHDL, LDLDIRECT in the last 72 hours.  Lab Results  Component Value Date   HGBA1C 5.6 03/01/2020   ------------------------------------------------------------------------------------------------------------------ No results for input(s): TSH, T4TOTAL, T3FREE, THYROIDAB in the last 72 hours.  Invalid input(s): FREET3 ------------------------------------------------------------------------------------------------------------------ No results for input(s): VITAMINB12, FOLATE, FERRITIN, TIBC, IRON, RETICCTPCT in the last 72  hours.  Coagulation profile Recent Labs  Lab 03/16/20 1022  INR 2.3*    No results for input(s): DDIMER in the last 72 hours.  Cardiac Enzymes No results for input(s): CKMB, TROPONINI, MYOGLOBIN in the last 168 hours.  Invalid input(s): CK ------------------------------------------------------------------------------------------------------------------    Component Value Date/Time   BNP 368.9 (H) 06/03/2019 2149     Vonzella Nipple M.D on 03/21/2020 at 8:37 AM  Go to [email or web address] - for contact info  [facility] - Office  [phone]

## 2020-03-21 NOTE — Progress Notes (Signed)
SLP Cancellation Note  Patient Details Name: Cody Hale MRN: [id number] DOB: 1950/12/07   Cancelled treatment:       Reason Eval/Treat Not Completed: Medical issues which prohibited therapy;Patient not medically ready. Pt was transferred to step down and is not currently appropriate for BSE. ST will continue efforts, Thank you, Shannan Slinker H. Romie Levee, CCC-SLP Speech Language Pathologist   Georgetta Haber 03/21/2020, 1:42 PM

## 2020-03-21 NOTE — Progress Notes (Signed)
Chart reviewed, remains on 5L [place] this AM. Continue to hold off on TEE until respiratory status is further improved.   Dina Rich MD

## 2020-03-21 NOTE — Procedures (Signed)
Bronchoscopy Procedure Note  Cody Hale  [id number]  [birth year]  Date:03/21/20  Time:3:09 PM   Provider Performing:Doretta Remmert   Procedure(s):  Initial Therapeutic Aspiration of Tracheobronchial Tree [phone])  Indication(s) 69 yr old male with aspiration pneumonitis, acute respiratory failure requiring intubation and mucus plugging of airway.  Consent Risks of the procedure as well as the alternatives and risks of each were explained to the patient and/or caregiver.  Consent for the procedure was obtained and is signed in the bedside chart  Anesthesia Versed, fentanyl  Time Out Verified patient identification, verified procedure, site/side was marked, verified correct patient position, special equipment/implants available, medications/allergies/relevant history reviewed, required imaging and test results available.   Sterile Technique Usual hand hygiene, masks, gowns, and gloves were used   Procedure Description He was placed on 100% FiO2 on ventilator.  Bronchoscope entered through the endotracheal tube and advanced to carina.    Right main bronchus entered.  Thick yellow secretions suctioned from all airways on the right.  Rt upper, middle, and lower lobes visualized.  No endobronchial lesions.  Left main bronchus visualized.  Thick white to yellow mucus collection obstructing left main bronchus.  Instilled saline and with brisk suction, able to clear mucus plug.  Continued suctioning of Lt upper, lingulair, and lower lobes with clearing of airways.  No endobronchial lesions.  Wedged bronchoscope into Lt upper lobe.  Instilled 40 ml of saline with 20 ml of cloudy, yellow fluid returned.    Complications/Tolerance None; patient tolerated the procedure well.   Specimen(s) BAL Lt upper lobe sent for gram stain and culture.  Chesley Mires, MD [place] Pager - [phone] 03/21/2020, 3:14 PM

## 2020-03-21 NOTE — Progress Notes (Signed)
MD at bedside, anesthesia at bedside, multiple RNs at bedside, respiratory therapy at bedside.  Orders from Dr. Craige Cotta to intubate. Bronchoscopy later.  9/14 @ 1355  50 mcg propofol 120mg  succ  Positive color change - sats 96%  8 ETT, 27 lip

## 2020-03-22 DIAGNOSIS — Z7189 Other specified counseling: Secondary | ICD-10-CM

## 2020-03-22 DIAGNOSIS — Z4659 Encounter for fitting and adjustment of other gastrointestinal appliance and device: Secondary | ICD-10-CM

## 2020-03-22 DIAGNOSIS — I5023 Acute on chronic systolic (congestive) heart failure: Secondary | ICD-10-CM

## 2020-03-22 DIAGNOSIS — J9621 Acute and chronic respiratory failure with hypoxia: Secondary | ICD-10-CM

## 2020-03-22 DIAGNOSIS — Z515 Encounter for palliative care: Secondary | ICD-10-CM

## 2020-03-22 DIAGNOSIS — J9622 Acute and chronic respiratory failure with hypercapnia: Secondary | ICD-10-CM

## 2020-03-22 DIAGNOSIS — Z978 Presence of other specified devices: Secondary | ICD-10-CM

## 2020-03-22 LAB — TRIGLYCERIDES: Triglycerides: 92 mg/dL (ref <150)

## 2020-03-22 LAB — GLUCOSE, CAPILLARY
Glucose-Capillary: 126 mg/dL — ABNORMAL HIGH (ref 70–99)
Glucose-Capillary: 131 mg/dL — ABNORMAL HIGH (ref 70–99)
Glucose-Capillary: 141 mg/dL — ABNORMAL HIGH (ref 70–99)
Glucose-Capillary: 161 mg/dL — ABNORMAL HIGH (ref 70–99)
Glucose-Capillary: 167 mg/dL — ABNORMAL HIGH (ref 70–99)
Glucose-Capillary: 191 mg/dL — ABNORMAL HIGH (ref 70–99)

## 2020-03-22 LAB — COMPREHENSIVE METABOLIC PANEL
ALT: 21 U/L (ref 0–44)
AST: 26 U/L (ref 15–41)
Albumin: 2.2 g/dL — ABNORMAL LOW (ref 3.5–5.0)
Alkaline Phosphatase: 73 U/L (ref 38–126)
Anion gap: 7 (ref 5–15)
BUN: 35 mg/dL — ABNORMAL HIGH (ref 8–23)
CO2: 30 mmol/L (ref 22–32)
Calcium: 8.5 mg/dL — ABNORMAL LOW (ref 8.9–10.3)
Chloride: 108 mmol/L (ref 98–111)
Creatinine, Ser: 0.62 mg/dL (ref 0.61–1.24)
GFR calc Af Amer: >60 mL/min (ref >60)
GFR calc non Af Amer: >60 mL/min (ref >60)
Glucose, Bld: 121 mg/dL — ABNORMAL HIGH (ref 70–99)
Potassium: 4.2 mmol/L (ref 3.5–5.1)
Sodium: 145 mmol/L (ref 135–145)
Total Bilirubin: 0.6 mg/dL (ref 0.3–1.2)
Total Protein: 5.1 g/dL — ABNORMAL LOW (ref 6.5–8.1)

## 2020-03-22 LAB — CBC
HCT: 36.1 % — ABNORMAL LOW (ref 39.0–52.0)
Hemoglobin: 10.7 g/dL — ABNORMAL LOW (ref 13.0–17.0)
MCH: 28.5 pg (ref 26.0–34.0)
MCHC: 29.6 g/dL — ABNORMAL LOW (ref 30.0–36.0)
MCV: 96.3 fL (ref 80.0–100.0)
Platelets: 139 10*3/uL — ABNORMAL LOW (ref 150–400)
RBC: 3.75 MIL/uL — ABNORMAL LOW (ref 4.22–5.81)
RDW: 16.1 % — ABNORMAL HIGH (ref 11.5–15.5)
WBC: 6.2 10*3/uL (ref 4.0–10.5)
nRBC: 0 % (ref 0.0–0.2)

## 2020-03-22 LAB — MAGNESIUM
Magnesium: 2.3 mg/dL (ref 1.7–2.4)
Magnesium: 2 mg/dL (ref 1.7–2.4)

## 2020-03-22 LAB — PHOSPHORUS
Phosphorus: 1.7 mg/dL — ABNORMAL LOW (ref 2.5–4.6)
Phosphorus: 4.8 mg/dL — ABNORMAL HIGH (ref 2.5–4.6)

## 2020-03-22 MED ORDER — FUROSEMIDE 10 MG/ML IJ SOLN
40.0000 mg | Freq: Four times a day (QID) | INTRAMUSCULAR | Status: AC
  Administered 2020-03-22 (×2): 40 mg via INTRAVENOUS
  Filled 2020-03-22 (×2): qty 4

## 2020-03-22 MED ORDER — ADULT MULTIVITAMIN W/MINERALS CH
1.0000 | ORAL_TABLET | Freq: Every day | ORAL | Status: DC
  Administered 2020-03-23 – 2020-03-30 (×8): 1
  Filled 2020-03-22 (×8): qty 1

## 2020-03-22 MED ORDER — ADULT MULTIVITAMIN LIQUID CH: 15.0000 mL | Freq: Every day | ORAL | Status: DC

## 2020-03-22 MED ORDER — PROSOURCE TF PO LIQD
45.0000 mL | Freq: Every day | ORAL | Status: DC
  Administered 2020-03-22 – 2020-03-27 (×20): 45 mL
  Filled 2020-03-22 (×18): qty 45

## 2020-03-22 MED ORDER — FERROUS SULFATE 300 (60 FE) MG/5ML PO SYRP
300.0000 mg | ORAL_SOLUTION | Freq: Two times a day (BID) | ORAL | Status: DC
  Administered 2020-03-22 – 2020-03-29 (×11): 300 mg
  Filled 2020-03-22 (×18): qty 5

## 2020-03-22 MED ORDER — POTASSIUM PHOSPHATES 15 MMOLE/5ML IV SOLN
20.0000 mmol | Freq: Once | INTRAVENOUS | Status: AC
  Administered 2020-03-22: 20 mmol via INTRAVENOUS
  Filled 2020-03-22: qty 6.67

## 2020-03-22 NOTE — Progress Notes (Signed)
Cardiology has been consulted for TEE. Ongoing respiratory issues throughout the week causes postponing of the procedure, patient intubated yesterday. May contact us back after extubation and when stable from respiratory standpoint to perform TEE.   Dina Rich MD

## 2020-03-22 NOTE — Progress Notes (Signed)
NAMEDieter Hale, MRN:  [id number], DOB:  10/02/1950, LOS: 6 ADMISSION DATE:  03/16/2020, CONSULTATION DATE:  03/21/2020 REFERRING MD:  Dr. Macky Lower, CHIEF COMPLAINT:  Hypoxia   Brief History   69 yo male former smoker brought to ER from [facility] with fever 105F and wound on Rt lower leg and sacrum.  Found to have pneumonia, sepsis, and bacteremia.  Developed progressive hypoxia with respiratory distress from aspiration and mucus plugging.  Transferred to ICU and required intubation.  Past Medical History  OSA, PSVT, Nephrolithiasis, Depression, LBBB, Bioprosthetic mitral valve, Gastroparesis, HTN, Endocarditis 2014, DM type 2, DJD, CAD, COPD, A fib, systolic CHF  Significant Hospital Events   9/09 Admit 9/14 transfer to ICU, intubated  Consults:    Procedures:  ETT 9/14 >>   Significant Diagnostic Tests:   Echo 03/18/20 >> EF 35 to 40%, RVSP 54.9 mmHg, s/p MVR  Bronchoscopy 03/21/20 >> mucus plug Lt main bronchus  CT chest 9/14//21 >> b/l consolidation and ATX, b/l effusions  Micro Data:  COVID 9/09 >> negative MRSA PCR 9/09 >> negative Blood 9/09 >> Group G Streptococcus Blood 9/12 >> Sputum 9/14 >>   Antimicrobials:  Zithromax 9/09 >> 9/13 Rocephin 9/09 >>   Interim history/subjective:  Remains on sedation, increased FiO2.  Objective   Blood pressure (!) 110/57, pulse 62, temperature 98.4 F (36.9 C), temperature source Axillary, resp. rate 15, height 5\' 9"  (1.753 m), weight 115.7 kg, SpO2 95 %.    Vent Mode: PRVC FiO2 (%):  [75 %-100 %] 75 % Set Rate:  [15 bmp] 15 bmp Vt Set:  [560 mL] 560 mL PEEP:  [5 cmH20] 5 cmH20 Plateau Pressure:  [16 cmH20-24 cmH20] 24 cmH20   Intake/Output Summary (Last 24 hours) at 03/22/2020 1434 Last data filed at 03/22/2020 1148 Gross per 24 hour  Intake 2462.46 ml  Output 600 ml  Net 1862.46 ml   Filed Weights   03/16/20 0720 03/18/20 0500 03/21/20 1410  Weight: 108.9 kg 115.4 kg 115.7 kg    Examination:  General -  sedated Eyes - pupils reactive ENT - ETT in place Cardiac - bradycardic, irregular Chest - b/l crackles Abdomen - soft, non tender, + bowel sounds Extremities - Rt lower leg in wrap, 2+ edema Skin - no rashes Neuro - RASS -2   Resolved Hospital Problem list     Assessment & Plan:   Acute hypoxic respiratory failure from aspiration pneumonitis with mucus plugging and b/l pleural effusions. Hx of COPD. - change vent to rate 16, PEEP 10 - Vt at 8 cc/kg - goal SpO2 90 to 95% - f/u CXR - lasix 40 mg q6h x 2 doses - scheduled BDs  Group G Streptococcal bacteremia. - Abx per primary team  S/p Mitral valve replacement with concern for endocarditis. Hx of A fib, HTN, acute on chronic systolic CHF, CAD. - increase diuresis - might need to hold coreg in setting of bradycardia >> defer to primary team - continue crestor, xarelto  DM type II poorly controlled with hyperglycemia. - SSI  Pressure ulcers. - sacrum and Rt foot, both present prior to admission - wound care  Acute metabolic encephalopathy from hypoxia, sepsis. - RASS goal 0 to -1  Best practice:  Diet: tube feeds DVT prophylaxis: xarelto GI prophylaxis: protonix Mobility: bed rest Code Status: full code Disposition: ICU  Labs    CMP Latest Ref Rng & Units 03/22/2020 03/21/2020 03/19/2020  Glucose 70 - 99 mg/dL 05/19/2020) 778(E) 423(N)  BUN  8 - 23 mg/dL 82(N) 05(L) 97(Q)  Creatinine 0.61 - 1.24 mg/dL 7.34 1.93 7.90  Sodium 135 - 145 mmol/L 145 144 136  Potassium 3.5 - 5.1 mmol/L 4.2 5.0 4.4  Chloride 98 - 111 mmol/L 108 106 100  CO2 22 - 32 mmol/L 30 32 29  Calcium 8.9 - 10.3 mg/dL 2.4(O) 9.0 9.7(D)  Total Protein 6.5 - 8.1 g/dL 5.1(L) 5.8(L) -  Total Bilirubin 0.3 - 1.2 mg/dL 0.6 0.8 -  Alkaline Phos 38 - 126 U/L 73 73 -  AST 15 - 41 U/L 26 18 -  ALT 0 - 44 U/L 21 17 -    CBC Latest Ref Rng & Units 03/22/2020 03/21/2020 03/19/2020  WBC 4.0 - 10.5 K/uL 6.2 5.9 6.0  Hemoglobin 13.0 - 17.0 g/dL 10.7(L) 11.8(L)  11.3(L)  Hematocrit 39 - 52 % 36.1(L) 40.4 38.0(L)  Platelets 150 - 400 K/uL 139(L) 146(L) 108(L)    ABG    Component Value Date/Time   PHART 7.385 03/21/2020 1730   PCO2ART 53.6 (H) 03/21/2020 1730   PO2ART 103 03/21/2020 1730   HCO3 29.5 (H) 03/21/2020 1730   TCO2 30 05/27/2019 0902   ACIDBASEDEF 1.7 03/02/2018 2230   O2SAT 97.7 03/21/2020 1730    CBG (last 3)  Recent Labs    03/22/20 0332 03/22/20 0807 03/22/20 1148  GLUCAP 126* 131* 167*    Critical care time: 33 minutes  Coralyn Helling, MD [place] [facility] Pager - [phone] 03/22/2020, 2:34 PM

## 2020-03-22 NOTE — Progress Notes (Signed)
Initial Nutrition Assessment  DOCUMENTATION CODES:   Obesity unspecified  INTERVENTION:  D/c Ensure Enlive, pt NPO  Vital High Protein @ 40 ml/hr via with 45 ml Prosource TF via tube 6x/day  MVI with minerals daily, via tube  This regimen provides 1200 kcal (1749 with propofol at current rate), 150 grams protein, and 806 ml free water  Free water flushes per MD  Monitor magnesium, potassium, and phosphorus daily for at least 3 days, MD to replete as needed, as pt is at risk for refeeding syndrome given poor po intake/NPO    NUTRITION DIAGNOSIS:   Inadequate oral intake related to inability to eat as evidenced by NPO status.  GOAL:   Provide needs based on ASPEN/SCCM guidelines  MONITOR:   Vent status, Labs, Weight trends, I & O's, TF tolerance, Diet advancement  REASON FOR ASSESSMENT:   Ventilator, Consult Enteral/tube feeding initiation and management  ASSESSMENT:  69 year old male with history of Afib, mitral valve replacement on Xarelto, COPD, DM2, CHF, Gastroparesis, OSA on CPAP nightly, dysphagia s/p PEG tube placement 06/04/19, chronic respiratory failure who presented from ALF and admitted with acute on chronic hypoxic respiratory failure secondary to CAP  9/9-admit 9/14 - transfer to ICU; intubated   Diet was advanced to heart healthy on 9/10, per flowsheets he consumed 25% of lunch and 50% of dinner on 9/11, then was NPO for possible TEE on 9/13. Patient developed progressive hypoxia with respiratory distress from aspiration and mucus plugging, transferred to ICU and required intubation. TEE postponed until after extubation and stable from respiratory standpoint.   +1 generalized; +2 BLE edema noted per 9/14 assessment.  Per chart, weights have fluctuated 102.1 kg - 121.6 kg over the last year, suspect this secondary to fluid shifts related to CHF.  BP: 110/57 (cuff) MAP: 74 (cuff)   I/O: +4336 ml since admit UOP: 600 ml x 24 hrs   Patient is currently  sedated and intubated on ventilator support MV: 9 L/min Temp (24hrs), Avg:98.4 F (36.9 C), Min:97.3 F (36.3 C), Max:99.6 F (37.6 C)  Propofol: 20.8 ml/hr provided 549 kcal  Medications reviewed and include: Vit C, Coreg, Colace, Miralax, Protonix, Ferrous sulfate, SSI, ProSource TF,  Reglan, MVI Potassium phosphate 20 mmol in D5 Rocephin  IVF: NaCl @ 20 ml/hr  Labs: CBGs 131,126,141,116, BUN 35 (H), P 1.7 (L), Hgb 10.7 (L), HCT 36.1 (L) K/Mg WNL  NUTRITION - FOCUSED PHYSICAL EXAM: Deferred   Diet Order:   Diet Order            Diet NPO time specified  Diet effective now                 EDUCATION NEEDS:   Not appropriate for education at this time  Skin:  Skin Assessment: Skin Integrity Issues: Skin Integrity Issues:: Diabetic Ulcer, Stage II Stage II: buttocks (05/08/19) Diabetic Ulcer: R;heel  Last BM:  9/12  Height:   Ht Readings from Last 1 Encounters:  03/22/20 5\' 9"  (1.753 m)    Weight:   Wt Readings from Last 1 Encounters:  03/21/20 115.7 kg    Ideal Body Weight:  72.7 kg  BMI:  Body mass index is 37.66 kg/m.  Estimated Nutritional Needs:   Kcal:  1388-1620 (12-14 kcal/kg)  Protein:  145 (2 g/kg/IBW)  Fluid:  > 1.3 L/day   03/23/20, RD, LDN Clinical Nutrition After Hours/Weekend Pager # in Amion

## 2020-03-22 NOTE — Consult Note (Signed)
Consultation Note Date: 03/22/20  Patient Name: Cody Hale  DOB: [birth year]  MRN: [id number]  Age / Sex: 69 y.o., male  PCP: Cody Lima, MD Referring Physician: Barton Dubois, MD  Reason for Consultation: Establishing goals of care  HPI/Patient Profile: 69 y.o. 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 9/14, worsening respiratory status requiring transfer to ICU and intubation. Pulmonology following. Palliative medicine consultation for goals of care.   Clinical Assessment and Goals of Care:  I have reviewed medical records, discussed with Dr. Dyann Hale and RN, and met patient at bedside. He is intubated and sedated. Wake up assessment not attempted this morning. He does not appear to be in pain or discomfort. No family at bedside.   Call placed to daughter, Cody Hale to discuss goals of care.  I introduced Palliative Medicine as specialized medical care for people living with serious illness. It focuses on providing relief from the symptoms and stress of a serious illness. The goal is to improve quality of life for both the patient and the family.  We discussed a brief life review of the patient. She reports that it has "been awhile since healthy" with history of smoking, ETOH use and multiple chronic conditions. She has one step-brother, Cody Hale from her father's previous marriage. He was living at [facility] up until about 4 months ago, when the patient's son Cody Hale moved him back out to an apartment to  "have a better life." Cody Hale unfortunately reports this did not go well with multiple falls, broke his back, and also suffered from covid during this time. He has been back at [facility] for about one month. Prior to admit, attempting to walk with therapy but most of the day is spent in the wheelchair. Cognitively intact with good appetite.   Discussed events leading up to admission and course of hospitalization including diagnoses, interventions, plan of care. Reviewed recommendations from specialists.   I attempted to elicit values and goals of care important to the patient and daughter. Advanced directives, concepts specific to code status, artifical feeding and hydration were discussed. Cody Hale shares that he does not have a documented living will or POA. Cody Hale also shares that he has not spoken in detail with her about his wishes but during a previous hospitalization when requiring intubation, the patient told Cody Hale he wanted to "live."   She wishes for him to remain a full code and ongoing aggressive medical management. Discussed watchful waiting and 'hoping for the best but also preparing for the worst' explaining what an aggressive pathway would look like if he is unable to successfully wean from ventilator (trach/peg/dependent at [facility]). Cody Hale does share thoughts of wanting him to have a good quality of  life and if there was "no hope" she wonders if he would want ongoing aggressive medical management. Reassured of ongoing support from palliative pending his clinical course.   Answered questions.    SUMMARY OF RECOMMENDATIONS    Continue full code/full scope treatment as patient as previously told daughter he wants to "live."  Ongoing palliative discussions pending clinical course. Watchful waiting.  PMT will follow inpatient.  Code Status/Advance Care Planning:  Full code  Symptom Management:   Per attending  Palliative Prophylaxis:   Aspiration, Delirium Protocol, Frequent Pain  Assessment, Oral Care and Turn Reposition  Psycho-social/Spiritual:   Desire for further Chaplaincy support: yes  Additional Recommendations: Caregiving  Support/Resources and Compassionate Wean Education  Prognosis:   Unable to determine  Discharge Planning: To Be Determined      Primary Diagnoses: Present on Admission: . Chronic atrial fibrillation . Chronic respiratory failure with hypoxia ([place]) . Chronic systolic CHF (congestive heart failure) ([place]) . 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 Essential hypertension, malignant . Major depressive disorder, recurrent severe without psychotic features ([place]) . OSA (obstructive sleep apnea) . Type II diabetes mellitus with manifestations ([place]) . Sepsis due to pneumonia . Streptococcus G Bacteremia   I have reviewed the medical record, interviewed the patient and family, and examined the patient. The following aspects are pertinent.  Past Medical History:  Diagnosis Date  . A-fib ([place])   . Anxiety   . Atrial fibrillation ([place])   . Cellulitis   . CHF (congestive heart failure) ([place])   . Chronic venous insufficiency   . COPD (chronic obstructive pulmonary disease) ([place])   . Coronary atherosclerosis of native coronary artery    Mild nonobstructive 08/2012  . Degenerative joint disease   . Diabetes mellitus, type II ([place])    Gastroparesis; GI care at [facility]  . 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 s/p zyvox rx.  (Cultures never positive); left bundle branch block; H/o SVT; 09/2012: bioprosthetic MVR at [facility]; a. Severe dental caries and cavities s/p multiple extractions.  . Essential hypertension, benign   . Gastroparesis   . 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 Left bundle branch block   . Left leg cellulitis   . Major depressive disorder, recurrent severe without psychotic features ([place])    [facility] admission 12/2012  . Morbid obesity ([place])  06/27/2012  . Nephrolithiasis   . PSVT (paroxysmal supraventricular tachycardia) ([facility])    Post-op at [facility]  . Sleep apnea    a. uses CPAP nightly  . Urinary tract infection 08/30/2012   Proteus mirabilis   Social History   Socioeconomic History  . Marital status: Legally Separated    Spouse name: Not on file  . Number of children: 2  . Years of education: Not on file  . Highest education level: Not on file  Occupational History  . Not on file  Tobacco Use  . Smoking status: Former Smoker    Packs/day: 1.00    Years: 20.00    Pack years: 20.00    Types: Cigarettes    Quit date: 07/08/1992    Years since quitting: 27.7  . Smokeless tobacco: Never Used  . Tobacco comment: smoked about 1.5ppd x 15 yrs, quit 15 yrs ago.  Vaping Use  . Vaping Use: Never used  Substance and Sexual Activity  . Alcohol use: No    Alcohol/week: 0.0 standard drinks  . Drug use: No  . Sexual activity: Not  Currently  Other Topics Concern  . Not on file  Social History Narrative   Lives in [place] --split with his wife 12/2012.  Has one living daughter, 2 grandchildren.   Had a son who died of a brain tumor at age 57yr.   No longer works since having mitral valve replacement.   Does not routinely exercise but starts cardiac rehab 11/18/12.   Tob 30 pack-yr hx, quit 1990s.     Alcohol: none in 30 yrs.  Distant history of heavy alcohol use.   No drug use.                  Social Determinants of Health   Financial Resource Strain:   . Difficulty of Paying Living Expenses: Not on file  Food Insecurity:   . Worried About RCharity fundraiserin the Last Year: Not on file  . Ran Out of Food in the Last Year: Not on file  Transportation Needs:   . Lack of Transportation (Medical): Not on file  . Lack of Transportation (Non-Medical): Not on file  Physical Activity:   . Days of Exercise per Week: Not on file  . Minutes of Exercise per Session: Not on file  Stress:   . Feeling of Stress : Not on file    Social Connections:   . Frequency of Communication with Friends and Family: Not on file  . Frequency of Social Gatherings with Friends and Family: Not on file  . Attends Religious Services: Not on file  . Active Member of Clubs or Organizations: Not on file  . Attends CArchivistMeetings: Not on file  . Marital Status: Not on file   Family History  Problem Relation Age of Onset  . Lung cancer Father        died @ 543 . Alcohol abuse Father   . Heart disease Father   . Diabetes Father   . Arthritis Father   . Ovarian cancer Mother        died @ 765 . Hypertension Sister   . Hypertension Sister   . Hypertension Sister   . Hypertension Brother   . Early death Neg Hx   . Hyperlipidemia Neg Hx   . Kidney disease Neg Hx   . Stroke Neg Hx   . Colon cancer Neg Hx   . Colon polyps Neg Hx    Scheduled Meds: . acetylcysteine  2 mL Nebulization BID  . ascorbic acid  500 mg Per Tube Daily  . carvedilol  3.125 mg Per Tube BID WC  . chlorhexidine gluconate (MEDLINE KIT)  15 mL Mouth Rinse BID  . Chlorhexidine Gluconate Cloth  6 each Topical Daily  . docusate  100 mg Oral BID  . feeding supplement (PROSource TF)  45 mL Per Tube 6 X Daily  . feeding supplement (VITAL HIGH PROTEIN)  1,000 mL Per Tube Q24H  . ferrous sulfate  300 mg Per Tube BID WC  . furosemide  40 mg Intravenous Q6H  . Gerhardt's butt cream   Topical QID  . insulin aspart  0-20 Units Subcutaneous Q4H  . ipratropium-albuterol  3 mL Nebulization Q6H  . mouth rinse  15 mL Mouth Rinse 10 times per day  . metoCLOPramide  5 mg Per Tube Q8H  . multivitamin with minerals  1 tablet Per Tube Daily  . pantoprazole sodium  40 mg Per Tube Q24H  . polyethylene glycol  17 g Oral Daily  . rivaroxaban  20 mg Per Tube Q supper  . rosuvastatin  5 mg Per Tube Daily  . sodium chloride flush  3 mL Intravenous Q12H  . tamsulosin  0.4 mg Oral QPC breakfast  . Vilazodone HCl  40 mg Oral Daily   Continuous Infusions: . sodium  chloride    . sodium chloride 20 mL/hr at 03/22/20 1148  . cefTRIAXone (ROCEPHIN)  IV Stopped (03/22/20 1016)  . propofol (DIPRIVAN) infusion 30 mcg/kg/min (03/22/20 1148)   PRN Meds:.sodium chloride, acetaminophen, albuterol, fentaNYL (SUBLIMAZE) injection, midazolam, [DISCONTINUED] ondansetron **OR** ondansetron (ZOFRAN) IV, polyethylene glycol, sodium chloride flush Medications Prior to Admission:  Prior to Admission medications   Medication Sig Start Date End Date Taking? Authorizing Provider  acetaminophen (TYLENOL) 325 MG tablet Take 325 mg by mouth every 6 (six) hours as needed.   Yes [provider]  albuterol (PROVENTIL) (2.5 MG/3ML) 0.083% nebulizer solution Take 2.5 mg by nebulization every 6 (six) hours as needed for wheezing or shortness of breath.   Yes [provider]  ascorbic acid (VITAMIN C) 500 MG tablet Take 500 mg by mouth daily.   Yes [provider]  blood glucose meter kit and supplies KIT Use to check blood sugar 3x per day. DX: E11.8 Accu Check 03/04/19  Yes Cody Lima, MD  carvedilol (COREG) 3.125 MG tablet Place 1 tablet (3.125 mg total) into feeding tube 2 (two) times daily with a meal. 06/18/19  Yes Mikhail, [place], DO  Doxepin HCl 3 MG TABS Take 1 tablet by mouth daily.   Yes [provider]  ferrous sulfate 325 (65 FE) MG tablet Take 1 tablet (325 mg total) by mouth 2 (two) times daily with a meal. 03/02/20  Yes Cody Lima, MD  fluticasone (FLONASE) 50 MCG/ACT nasal spray Place 2 sprays into both nostrils daily. 03/01/20  Yes Cody Lima, MD  Fluticasone-Umeclidin-Vilant (TRELEGY ELLIPTA) 100-62.5-25 MCG/INH AEPB Inhale 1 puff into the lungs daily.   Yes [provider]  glucose blood (COOL BLOOD GLUCOSE TEST STRIPS) test strip Use to check blood sugar 3x per day. DX: E11.8 Accu Check 03/04/19  Yes Cody Lima, MD  insulin aspart (NOVOLOG FLEXPEN) 100 UNIT/ML FlexPen Inject 5 Units into the skin with  breakfast, with lunch, and with evening meal. 01/13/19  Yes [provider]  Insulin Degludec (TRESIBA) 100 UNIT/ML SOLN Inject 40 Units into the skin daily.   Yes [provider]  Insulin Pen Needle (NOVOFINE AUTOCOVER) 30G X 8 MM MISC USE TID. 02/23/19  Yes Cody Lima, MD  ipratropium-albuterol (DUONEB) 0.5-2.5 (3) MG/3ML SOLN Take 3 mLs by nebulization every 6 (six) hours as needed.    Yes [provider]  Lancets (ACCU-CHEK SOFT TOUCH) lancets Use to check blood sugar 3x per day. DX: E11.8 Accu Check 03/04/19  Yes Cody Lima, MD  levocetirizine (XYZAL) 5 MG tablet Place 1 tablet (5 mg total) into feeding tube every evening. 06/18/19  Yes Mikhail, Velta Add[place], DO  losartan (COZAAR) 25 MG tablet Place 0.5 tablets (12.5 mg total) into feeding tube daily. 06/18/19 03/16/20 Yes Mikhail, Maryann, DO  magnesium oxide (MAG-OX) 400 MG tablet Take 1 tablet by mouth 2 (two) times daily. 02/05/19  Yes [provider]  metoCLOPramide (REGLAN) 5 MG tablet Take 5 mg by mouth 3 (three) times daily before meals.   Yes [provider]  Multiple Vitamin (MULTIVITAMIN WITH MINERALS) TABS tablet Place 1 tablet into feeding tube daily. 06/18/19  Yes Mikhail, Velta Addison, DO  nitroGLYCERIN (NITROSTAT) 0.4 MG SL tablet Place 1 tablet (0.4 mg total) under the tongue every 5 (five) minutes as needed for chest pain. Max 3 doses. 06/18/19  Yes Mikhail, Velta Addison, DO  omeprazole (PRILOSEC) 40 MG capsule Take 40 mg by mouth daily.   Yes [provider]  ondansetron (ZOFRAN) 4 MG tablet Take 4 mg by mouth every 8 (eight) hours as needed for nausea or vomiting.   Yes [provider]  oxycodone (OXY-IR) 5 MG capsule Take 1 capsule (5 mg total) by mouth every 6 (six) hours as needed. 03/01/20  Yes Cody Lima, MD  rivaroxaban (XARELTO) 20 MG TABS tablet Take one tablet daily via tube. 06/18/19  Yes Mikhail, Velta Addison, DO  rosuvastatin (CRESTOR) 5 MG tablet Take 5 mg by mouth  daily. 10/23/19  Yes [provider]  tamsulosin (FLOMAX) 0.4 MG CAPS capsule Take 1 capsule (0.4 mg total) by mouth daily after breakfast. 06/18/19  Yes Mikhail, [place], DO  Vilazodone HCl (VIIBRYD) 40 MG TABS Take 40 mg by mouth daily.   Yes [provider]  mupirocin ointment (BACTROBAN) 2 % On leg rash bid Patient not taking: Reported on 03/16/2020 02/17/20   Plotnikov, Evie Lacks, MD  Nutritional Supplements (ENSURE ACTIVE PO) Take 120 mLs by mouth 2 (two) times daily. Patient not taking: Reported on 03/16/2020    [provider]  OXYGEN Inhale 2 L/min into the lungs continuous. Patient not taking: Reported on 03/16/2020    [provider]  pantoprazole sodium (PROTONIX) 40 mg/20 mL PACK Place 20 mLs (40 mg total) into feeding tube daily. Patient not taking: Reported on 03/16/2020 06/18/19   Cristal Ford, DO  torsemide (DEMADEX) 20 MG tablet Place 1 tablet (20 mg total) into feeding tube daily at 6 PM. Patient not taking: Reported on 03/16/2020 03/01/20   Cody Lima, MD  valACYclovir (VALTREX) 1000 MG tablet Take 1 tablet (1,000 mg total) by mouth 3 (three) times daily. Patient not taking: Reported on 03/16/2020 02/17/20   Plotnikov, Evie Lacks, MD   Allergies  Allergen Reactions  . Daptomycin Rash  . Lisinopril Cough  . Metformin And Related Diarrhea  . Tape Rash and Other (See Comments)    Adhesive Tape-Burn skin.   Review of Systems  Unable to perform ROS: Acuity of condition    Physical Exam Vitals and nursing note reviewed.  Constitutional:      Appearance: He is ill-appearing.     Interventions: He is sedated and intubated.  HENT:     Head: Normocephalic and atraumatic.  Cardiovascular:     Rate and Rhythm: Bradycardia present.  Pulmonary:     Effort: No tachypnea, accessory muscle usage or respiratory distress. He is intubated.     Breath sounds: Decreased breath sounds present.     Comments: intubated Abdominal:     Tenderness: There  is no abdominal tenderness.  Skin:    General: Skin is warm and dry.  Neurological:     Comments: sedated    Vital Signs: BP (!) 124/42   Pulse 67   Temp 98.1 F (36.7 C) (Axillary)   Resp 18   Ht 5' 9"  (1.753 m)   Wt 115.7 kg   SpO2 95%   BMI 37.66 kg/m  Pain Scale: 0-10   Pain Score: Asleep   SpO2: SpO2: 95 % O2 Device:SpO2: 95 % O2 Flow Rate: .O2 Flow Rate (L/min): 6 L/min  IO: Intake/output summary:   Intake/Output Summary (Last 24 hours) at 03/22/2020  1636 Last data filed at 03/22/2020 1148 Gross per 24 hour  Intake 2462.46 ml  Output 600 ml  Net 1862.46 ml    LBM: Last BM Date: 03/19/20 Baseline Weight: Weight: 108.9 kg Most recent weight: Weight:  (scale not working on the bed)     Palliative Assessment/Data: PPS 30%   Flowsheet Rows     Most Recent Value  Intake Tab  Referral Department Hospitalist  Unit at Time of Referral ICU  Palliative Care Primary Diagnosis Other (Comment)  Palliative Care Type New Palliative care  Reason for referral Clarify Goals of Care  Date first seen by Palliative Care 03/22/20  Clinical Assessment  Palliative Performance Scale Score 30%  Psychosocial & Spiritual Assessment  Palliative Care Outcomes  Patient/Family meeting held? Yes  Who was at the meeting? daughter Cody Hale)  [place] goals of care, Provided psychosocial or spiritual support, ACP counseling assistance      Time In: 1400 Time Out: 1500 Time Total: 60 Greater than 50%  of this time was spent counseling and coordinating care related to the above assessment and plan.  Signed by:  Ihor Dow, DNP, FNP-C Palliative Medicine Team  Phone: [phone] Fax: [phone]   Please contact Palliative Medicine Team phone at [phone] for questions and concerns.  For individual provider: See Shea Evans

## 2020-03-22 NOTE — Progress Notes (Signed)
Patient Demographics:    Cody Hale, is a 69 y.o. male, DOB - 1951/06/30, DSK:[id number]  Admit date - 03/16/2020   Admitting Physician Courage Denton Brick, MD  Outpatient Primary MD for the patient is [name], MD  LOS - 6  Chief Complaint  Patient presents with  . Fever        Subjective:    Cody Hale intubated, sedated and mechanically ventilated. Currently afebrile.    Assessment  & Plan :    Principal Problem:   Sepsis due to pneumonia Active Problems:   OSA (obstructive sleep apnea)   Essential hypertension, malignant   Type II diabetes mellitus with manifestations (HCC)   Chronic systolic CHF (congestive heart failure) (HCC)   Goals of care, counseling/discussion   COPD (chronic obstructive pulmonary disease) with chronic bronchitis (HCC)   Chronic atrial fibrillation   S/P mitral valve replacement with bioprosthetic valve   Major depressive disorder, recurrent severe without psychotic features ([place])   Chronic respiratory failure with hypoxia ([place])   Community acquired pneumonia   Streptococcus G Bacteremia   Endotracheally intubated   Palliative care by specialist  Brief Summary:- 69 y.o. 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, chronic atrial fibrillation with chronic anticoagulation, COPD, diabetes mellitus , h/o T11/L4 fusion after lumbar fracture 11/17/20withWound dehiscence noted12/7/20, foot wound Prior history of dysphagia requiring PEG tube placement 06/04/2019 chronic hypoxic respiratory failure admitted on 03/17/2019 from [facility] with acute on chronic hypoxic respiratory failure secondary to community-acquired pneumonia -Prior H/o Bacterial Endocarditis --s/p Mitral Valve Repair Previously Now has Strep group G Bacteremia---Needs  TEE  A/p 1)Community-acquired Pneumonia---  --In the ED chest x-ray was suggestive of pulmonary venous congestion Versus pneumonia -However given fevers, leukocytosis cough and dyspnea and hypoxia, favor pneumonia -On IV Rocephin and azithromycin, bronchodilators and mucolytics -Repeat chest x-ray yesterday showed stable complete opacification of the left hemithorax likely combination of effusion and atelectasis. -aspiration and bilateral pleural effusion also playing a role in his difficulty breathing. -currently intubated and mechanically ventilated; please see below.  2)Strep Group G Sepsis --patient had leukocytosis, tachypnea, tachycardia and fevers --- as well as confusion and hypoxia -- suspect secondary to. -UA not suggestive of UTI -Patient also has right foot wound and sacral wounds do not look particularly infected--please see photos in epic --Lactic acid is not elevated -Continue Rocephin blood cultures from 03/16/20 with strep group G,--patient with prior history of endocarditis repeat TTE from 03/18/2020 noted  repeat blood cultures on 03/19/2020 no growth so far. -WBC 16.7 >>9.9>>6.0>>5.9 -Prior H/o Bacterial Endocarditis --s/p Mitral Valve Repair Previously Now has Strep group G Bacteremia---Needs TEE -If TEE is negative patient may be discharged home on p.o. Keflex to complete 2 weeks of antibiotic therapy for bacteremia (2 weeks from first negative culture) --Phone consult with Dr. Bobby Rumpf from infectious disease 9/14: Unable to do transesophageal echocardiogram because of his respiratory status.  3) chronic anemia----hemoglobin currently above 11 which is higher than recent baseline, -No bleeding concerns at this time -Continue to follow hemoglobin trend.  4) chronic atrial fibrillation   --on anticoagulation with Xarelto -Rate control overall.  5)Bioprosthetic Mitral Valve Replacement after Endocarditis  in 2014--repeat echo from 03/18/20 showed---mitral valve  has been repaired/replaced. No evidence of mitral valve regurgitation. Moderate to severe mitral stenosis. The mean mitral valve gradient is 11.7 mmHg at 89bpm. There is a 29 mm St. Jude tissue valve present in the mitral position.  --Cardiology consult for possible TEE requested due to strep group G bacteremia -Unable to do TEE secondary to his respiratory status. -Continue current IV antibiotics.  6)DM2-  -Continue sliding scale insulin -Follow CBGs and adjust hypoglycemic regimen as required.   7) decubitus ulcers--mostly on the buttocks and right foot----wound care consult appreciated, -Continue to follow wound care recommendations and preventive measures  8) acute on chronic hypoxic respiratory failure--- secondary to #1 above, with aspiration pneumonitis and concern for mucous plugging  -Currently intubated and mechanically ventilated -Ventilator rate 16, PEEP 10 volume ACC per kilogram -Continue scheduled bronchodilators IV Lasix x2 to further assist drying long-distance telemedicine volume. -Wean as tolerated and follow pulmonology recommendation.  Status is: Inpatient   Dispo: The patient is from: ALF  Anticipated d/c is to:  To be determined; but possible will require skilled nursing facility at discharge.  Anticipated d/c date is: Unknown at this time.  Patient currently is not medically stable to d/c. Barriers: Not Clinically Stable-pneumonia/sepsis strep group G bacteremia requiring IV antibiotics.  Also having acute hypoxemic respiratory failure, requiring mechanical ventilation.  Continue current IV antibiotics, follow clinical response and recommendations by pulmonologist.  Code Status : full  Family Communication: Discussed with the patient's daughter on phone at [phone].  Explained to her about the high suspicion for aspiration and his risk for worsening respiratory failure.  Daughter at this time wishes for the patient to be  full code.  Consults  :  Phone consult with ID and Cardiologist DVT Prophylaxis  :  Xarelto- SCDs   Lab Results  Component Value Date   PLT 139 (L) 03/22/2020    Inpatient Medications  Scheduled Meds: . acetylcysteine  2 mL Nebulization BID  . ascorbic acid  500 mg Per Tube Daily  . carvedilol  3.125 mg Per Tube BID WC  . chlorhexidine gluconate (MEDLINE KIT)  15 mL Mouth Rinse BID  . Chlorhexidine Gluconate Cloth  6 each Topical Daily  . docusate  100 mg Oral BID  . feeding supplement (PROSource TF)  45 mL Per Tube 6 X Daily  . feeding supplement (VITAL HIGH PROTEIN)  1,000 mL Per Tube Q24H  . ferrous sulfate  300 mg Per Tube BID WC  . furosemide  40 mg Intravenous Q6H  . Gerhardt's butt cream   Topical QID  . insulin aspart  0-20 Units Subcutaneous Q4H  . ipratropium-albuterol  3 mL Nebulization Q6H  . mouth rinse  15 mL Mouth Rinse 10 times per day  . metoCLOPramide  5 mg Per Tube Q8H  . multivitamin with minerals  1 tablet Per Tube Daily  . pantoprazole sodium  40 mg Per Tube Q24H  . polyethylene glycol  17 g Oral Daily  . rivaroxaban  20 mg Per Tube Q supper  . rosuvastatin  5 mg Per Tube Daily  . sodium chloride flush  3 mL Intravenous Q12H  . tamsulosin  0.4 mg Oral QPC breakfast  . Vilazodone HCl  40 mg Oral Daily   Continuous Infusions: . sodium chloride    . sodium chloride 20 mL/hr at 03/22/20 1649  . cefTRIAXone (ROCEPHIN)  IV Stopped (03/22/20 1016)  . propofol (DIPRIVAN) infusion 20 mcg/kg/min (03/22/20 1804)  PRN Meds:.sodium chloride, acetaminophen, albuterol, fentaNYL (SUBLIMAZE) injection, midazolam, [DISCONTINUED] ondansetron **OR** ondansetron (ZOFRAN) IV, polyethylene glycol, sodium chloride flush    Anti-infectives (From admission, onward)   Start     Dose/Rate Route Frequency Ordered Stop   03/17/20 0830  cefTRIAXone (ROCEPHIN) 2 g in sodium chloride 0.9 % 100 mL IVPB        2 g 200 mL/hr over 30 Minutes Intravenous Every 24 hours 03/17/20  0813     03/16/20 1945  cefTRIAXone (ROCEPHIN) 1 g in sodium chloride 0.9 % 100 mL IVPB  Status:  Discontinued        1 g 200 mL/hr over 30 Minutes Intravenous Every 24 hours 03/16/20 1933 03/17/20 0813   03/16/20 1945  azithromycin (ZITHROMAX) 500 mg in sodium chloride 0.9 % 250 mL IVPB  Status:  Discontinued        500 mg 250 mL/hr over 60 Minutes Intravenous Every 24 hours 03/16/20 1933 03/21/20 1206   03/16/20 1115  cefTRIAXone (ROCEPHIN) 1 g in sodium chloride 0.9 % 100 mL IVPB        1 g 200 mL/hr over 30 Minutes Intravenous  Once 03/16/20 1103 03/16/20 1210   03/16/20 1115  azithromycin (ZITHROMAX) 500 mg in sodium chloride 0.9 % 250 mL IVPB        500 mg 250 mL/hr over 60 Minutes Intravenous  Once 03/16/20 1103 03/16/20 1326        Objective:   Vitals:   03/22/20 1530 03/22/20 1600 03/22/20 1623 03/22/20 1630  BP: (!) 134/49 (!) 124/42  (!) 132/49  Pulse: (!) 59 (!) 59 67 100  Resp: _0 Temp:   98.1 F (36.7 C)   TempSrc:   Axillary   SpO2: 93% 94% 95% 97%  Weight:      Height:        Wt Readings from Last 3 Encounters:  03/21/20 115.7 kg  03/01/20 110.7 kg  02/17/20 102.1 kg     Intake/Output Summary (Last 24 hours) at 03/22/2020 1918 Last data filed at 03/22/2020 1756 Gross per 24 hour  Intake 1314.04 ml  Output 2100 ml  Net -785.96 ml    Physical Exam General exam: Sedated, intubated and mechanically ventilated.  During my examination unable to follow commands. Respiratory system: Positive bilateral crackles at the bases, diffuse rhonchi, no wheezing. Cardiovascular system: Irregular, no rubs, no gallops Gastrointestinal system: Abdomen is obese, nondistended, soft and nontender. No organomegaly or masses felt. Normal bowel sounds heard. Central nervous system: Unable to properly assess secondary to ongoing sedation Extremities: N cyanosis or clubbing; 1+ edema bilaterally. Skin: No rashes, no petechiae. Psychiatry: Unable to properly assess  secondary to ongoing sedation.    Data Review:   Micro Results Recent Results (from the past 240 hour(s))  Urine culture     Status: Abnormal   Collection Time: 03/16/20  7:39 AM   Specimen: Urine, Catheterized  Result Value Ref Range Status   Specimen Description   Final    URINE, CATHETERIZED Performed at [facility], [address], [place], [place] [address]    Special Requests   Final    NONE Performed at [facility], [address], [place], [place] [address]    Culture MULTIPLE SPECIES PRESENT, SUGGEST RECOLLECTION (A)  Final   Report Status 03/17/2020 FINAL  Final  SARS Coronavirus 2 by RT PCR (hospital order, performed in [facility] hospital lab) Nasopharyngeal Nasopharyngeal Swab     Status: None  Collection Time: 03/16/20 10:03 AM   Specimen: Nasopharyngeal Swab  Result Value Ref Range Status   SARS Coronavirus 2 NEGATIVE NEGATIVE Final    Comment: (NOTE) SARS-CoV-2 target nucleic acids are NOT DETECTED.  The SARS-CoV-2 RNA is generally detectable in upper and lower respiratory specimens during the acute phase of infection. The lowest concentration of SARS-CoV-2 viral copies this assay can detect is 250 copies / mL. A negative result does not preclude SARS-CoV-2 infection and should not be used as the sole basis for treatment or other patient management decisions.  A negative result may occur with improper specimen collection / handling, submission of specimen other than nasopharyngeal swab, presence of viral mutation(s) within the areas targeted by this assay, and inadequate number of viral copies (<250 copies / mL). A negative result must be combined with clinical observations, patient history, and epidemiological information.  Fact Sheet for Patients:   StrictlyIdeas.no  Fact Sheet for Healthcare Providers: BankingDealers.co.za  This test is not yet approved or  cleared by the [place] FDA and has been  authorized for detection and/or diagnosis of SARS-CoV-2 by FDA under an Emergency Use Authorization (EUA).  This EUA will remain in effect (meaning this test can be used) for the duration of the COVID-19 declaration under Section 564(b)(1) of the Act, 21 U.S.C. section 360bbb-3(b)(1), unless the authorization is terminated or revoked sooner.  Performed at [facility], [address] [place], [place] [address]   Blood Culture (routine x 2)     Status: Abnormal   Collection Time: 03/16/20 10:22 AM   Specimen: BLOOD  Result Value Ref Range Status   Specimen Description   Final    BLOOD LEFT ANTECUBITAL Performed at [facility], [address], [place], [place] [address]    Special Requests   Final    BOTTLES DRAWN AEROBIC ONLY Blood Culture adequate volume Performed at [facility], [address], [place], [place] [address]    Culture  Setup Time   Final    AEROBIC BOTTLE ONLY GRAM POSITIVE COCCI IN CHAINS Gram Stain Report Called to,Read Back By and Verified With: C THOMAS,RN_[id number]  03/17/20 MKELLY Organism ID to follow CRITICAL RESULT CALLED TO, READ BACK BY AND VERIFIED WITH: T MAYO RN 03/17/20 0618 JDW Performed at [facility], [address], [place], [place] [address]    Culture STREPTOCOCCUS GROUP G (A)  Final   Report Status 03/19/2020 FINAL  Final   Organism ID, Bacteria STREPTOCOCCUS GROUP G  Final      Susceptibility   Streptococcus group g - MIC*    CLINDAMYCIN <=0.25 SENSITIVE Sensitive     AMPICILLIN <=0.25 SENSITIVE Sensitive     ERYTHROMYCIN <=0.12 SENSITIVE Sensitive     VANCOMYCIN <=0.12 SENSITIVE Sensitive     CEFTRIAXONE <=0.12 SENSITIVE Sensitive     LEVOFLOXACIN 0.5 SENSITIVE Sensitive     PENICILLIN Value in next row Sensitive      SENSITIVE<=0.06    * STREPTOCOCCUS GROUP G  Blood Culture (routine x 2)     Status: None   Collection Time: 03/16/20 10:22 AM   Specimen: BLOOD  Result Value Ref Range Status   Specimen Description BLOOD LEFT HAND   Final   Special Requests   Final    BOTTLES DRAWN AEROBIC ONLY Blood Culture adequate volume   Culture   Final    NO GROWTH 5 DAYS Performed at [facility], [address] [place], [place] [address]    Report Status 03/21/2020 FINAL  Final  Blood Culture ID Panel (Reflexed)     Status: Abnormal   Collection Time: 03/16/20 10:22 AM  Result Value Ref Range Status   Enterococcus faecalis NOT DETECTED NOT DETECTED Final   Enterococcus Faecium NOT DETECTED NOT DETECTED Final   Listeria monocytogenes NOT DETECTED NOT DETECTED Final   Staphylococcus species NOT DETECTED NOT DETECTED Final   Staphylococcus aureus (BCID) NOT DETECTED NOT DETECTED Final   Staphylococcus epidermidis NOT DETECTED NOT DETECTED Final   Staphylococcus lugdunensis NOT DETECTED NOT DETECTED Final   Streptococcus species DETECTED (A) NOT DETECTED Final    Comment: Not Enterococcus species, Streptococcus agalactiae, Streptococcus pyogenes, or Streptococcus pneumoniae. CRITICAL RESULT CALLED TO, READ BACK BY AND VERIFIED WITH: T MAYO RN 03/17/20 0618 JDW    Streptococcus agalactiae NOT DETECTED NOT DETECTED Final   Streptococcus pneumoniae NOT DETECTED NOT DETECTED Final   Streptococcus pyogenes NOT DETECTED NOT DETECTED Final   A.calcoaceticus-baumannii NOT DETECTED NOT DETECTED Final   Bacteroides fragilis NOT DETECTED NOT DETECTED Final   Enterobacterales NOT DETECTED NOT DETECTED Final   Enterobacter cloacae complex NOT DETECTED NOT DETECTED Final   Escherichia coli NOT DETECTED NOT DETECTED Final   Klebsiella aerogenes NOT DETECTED NOT DETECTED Final   Klebsiella oxytoca NOT DETECTED NOT DETECTED Final   Klebsiella pneumoniae NOT DETECTED NOT DETECTED Final   Proteus species NOT DETECTED NOT DETECTED Final   Salmonella species NOT DETECTED NOT DETECTED Final   Serratia marcescens NOT DETECTED NOT DETECTED Final   Haemophilus influenzae NOT DETECTED NOT DETECTED Final   Neisseria meningitidis NOT DETECTED NOT  DETECTED Final   Pseudomonas aeruginosa NOT DETECTED NOT DETECTED Final   Stenotrophomonas maltophilia NOT DETECTED NOT DETECTED Final   Candida albicans NOT DETECTED NOT DETECTED Final   Candida auris NOT DETECTED NOT DETECTED Final   Candida glabrata NOT DETECTED NOT DETECTED Final   Candida krusei NOT DETECTED NOT DETECTED Final   Candida parapsilosis NOT DETECTED NOT DETECTED Final   Candida tropicalis NOT DETECTED NOT DETECTED Final   Cryptococcus neoformans/gattii NOT DETECTED NOT DETECTED Final    Comment: Performed at [facility], [address] [place], [place] [address]  MRSA PCR Screening     Status: None   Collection Time: 03/16/20  8:44 PM   Specimen: Nasopharyngeal  Result Value Ref Range Status   MRSA by PCR NEGATIVE NEGATIVE Final    Comment:        The GeneXpert MRSA Assay (FDA approved for NASAL specimens only), is one component of a comprehensive MRSA colonization surveillance program. It is not intended to diagnose MRSA infection nor to guide or monitor treatment for MRSA infections. Performed at [facility], [address] [place], [place] [address]   Culture, blood (Routine X 2) w Reflex to ID Panel     Status: None (Preliminary result)   Collection Time: 03/19/20  8:44 AM   Specimen: Right Antecubital; Blood  Result Value Ref Range Status   Specimen Description   Final    RIGHT ANTECUBITAL BOTTLES DRAWN AEROBIC AND ANAEROBIC   Special Requests Blood Culture adequate volume  Final   Culture   Final    NO GROWTH 3 DAYS Performed at [facility], [address] [place], [place] [address]    Report Status PENDING  Incomplete  Culture, blood (Routine X 2) w Reflex to ID Panel     Status: None (Preliminary result)   Collection Time: 03/19/20  8:44 AM   Specimen: BLOOD RIGHT HAND  Result Value Ref Range Status   Specimen Description   Final    BLOOD RIGHT HAND BOTTLES DRAWN AEROBIC AND ANAEROBIC   Special Requests Blood Culture adequate volume   Final   Culture   Final    NO GROWTH 3 DAYS Performed at [facility], [address], [place], [place] [address]    Report Status PENDING  Incomplete  MRSA PCR Screening     Status: None   Collection Time: 03/21/20  1:20 PM   Specimen: Nasopharyngeal  Result Value Ref Range Status   MRSA by PCR NEGATIVE NEGATIVE Final    Comment:        The GeneXpert MRSA Assay (FDA approved for NASAL specimens only), is one component of a comprehensive MRSA colonization surveillance program. It is not intended to diagnose MRSA infection nor to guide or monitor treatment for MRSA infections. Performed at [facility], [address], [place], [place] [address]   Culture, respiratory     Status: None (Preliminary result)   Collection Time: 03/21/20  3:10 PM   Specimen: Bronchoalveolar Lavage; Respiratory  Result Value Ref Range Status   Specimen Description   Final    BRONCHIAL ALVEOLAR LAVAGE Performed at [facility], [address], [place], [place] [address]    Special Requests   Final    NONE Performed at [facility], [address], [place], [place] [address]    Gram Stain NO WBC SEEN NO ORGANISMS SEEN   Final   Culture   Final    NO GROWTH < 12 HOURS Performed at [facility], [address] [place], [place] [address]    Report Status PENDING  Incomplete    Radiology Reports DG Chest 2 View  Result Date: 03/20/2020 CLINICAL DATA:  Shortness of breath. EXAM: CHEST - 2 VIEW COMPARISON:  March 19, 2020. FINDINGS: There is stable complete opacification of left hemithorax most likely due to a combination of effusion and atelectasis. Sternotomy wires are noted. Stable right basilar opacity is noted concerning for edema or atelectasis. No pneumothorax is noted. IMPRESSION: Stable complete opacification of left hemithorax most likely due to combination of effusion and atelectasis. Stable right basilar opacity concerning for edema or atelectasis. Electronically Signed   By: Marijo Conception M.D.   On: 03/20/2020 14:41   CT CHEST WO CONTRAST  Result Date: 03/21/2020 CLINICAL DATA:  Shortness of breath, history of CHF, COPD. Thoracentesis 03/21/2019 EXAM: CT CHEST WITHOUT CONTRAST TECHNIQUE: Multidetector CT imaging of the chest was performed following the standard protocol without IV contrast. COMPARISON:  Chest CT 05/19/2018 FINDINGS: Imaging quality is severely degraded due to patient body habitus resulting in extensive photon starvation which also exacerbates the streak artifact from patient's hardware Cardiovascular: Cardiomegaly with four-chamber cardiac enlargement. Dense calcification of mitral annulus. Three-vessel coronary artery atherosclerosis. Focal pericardial thickening towards the apical septum (4/99). Atherosclerotic plaque within the normal caliber aorta. No periaortic stranding or hemorrhage. Normal 3 vessel branching of the aortic arch. Proximal great vessels are calcified but otherwise of grossly unremarkable. Central pulmonary artery enlargement, nonspecific but similar to prior and possibly reflecting some chronic pulmonary hypertension. Luminal evaluation precluded in the absence of contrast media. Pulmonary vascularity within the lung parenchyma is cephalized and redistributed. Luminal evaluation of the vasculature precluded in the absence of contrast. Mediastinum/Nodes: Mild edematous changes in the mediastinum. Few enlarged and borderline enlarged low-attenuation mediastinal nodes are present including a 14 mm subcarinal node (2/71). No axillary adenopathy. Hilar nodal evaluation is  severely limited in the absence intravenous contrast media. Postsurgical changes in the anterior mediastinum likely related prior sternotomy and CABG. Endotracheal tube terminates low within the trachea, 1.7 cm from the carina. Consider retracting at least 2 cm to the mid trachea. Transesophageal tube is in place. Tip terminates below the level of imaging with but with the side port  appropriately positioned distal to the GE junction. Thyroid gland and thoracic inlet are unremarkable. Lungs/Pleura: Moderate to large bilateral pleural effusions, right greater than left with essentially complete atelectatic collapse of both lower lobes, subsegmental collapse of the right middle lobe and lingula as well. Underlying consolidation is not excluded particularly given areas of mixed consolidation and ground-glass within the aerated portions of the upper lungs. Some mild interlobular septal thickening is noted as well. No pneumothorax. Upper Abdomen: Small amount of subdiaphragmatic ascites. Questionable stranding centered upon the pancreatic tail. Upper abdominal atherosclerosis. Musculoskeletal: Appearance of extensive bridging syndesmophytes as well as fusion across the spinous processes with some exaggerated thoracic kyphosis. Could correlate for features ankylosing spondylitis. No acute fracture or suspicious osseous lesions. Prior sternotomy as well. Lower thoracolumbar fusion hardware is noted including vertebral augmentation changes at the T11 and 12 levels. Additional degenerative changes in both shoulders. Moderate body wall edema. IMPRESSION: 1. Imaging quality is severely degraded due to patient body habitus resulting in extensive photon starvation which also exacerbates the streak artifact from patient's hardware. 2. Moderate to large bilateral pleural effusions, right greater than left, with essentially complete atelectatic collapse of both lower lobes, subsegmental collapse of the right middle lobe and lingula as well. Underlying consolidation in the regions of volume loss is not excluded with evidence of additional areas of consolidation and ground-glass within the aerated portions of the upper lungs concerning for an acute infectious/inflammatory process including atypical viral etiologies. 3. Additional features throughout the chest suggesting CHF/volume overload with pulmonary edema,  cardiomegaly, ascites and body wall edema. 4. Dense mitral annular calcifications, could consider nonemergent echocardiography for evaluation of valvulae op a 3. 5. Focal pericardial thickening towards the apical septum. Nonspecific appearance. Could correlate for features of pericarditis or ischemic change. 6. Central pulmonary artery enlargement, nonspecific and incompletely evaluated in the absence of contrast media but similar to prior and possibly reflecting some chronic pulmonary hypertension. 7. Small amount of subdiaphragmatic ascites. 8. Questionable stranding centered upon the pancreatic tail. Possibly redistributed ascites though should correlate with lipase to exclude pancreatitis. 9. Appearance of extensive bridging syndesmophytes as well as fusion across the spinous processes with some exaggerated thoracic kyphosis. Such imaging features can be seen in the setting of ankylosing spondylitis. 10. Aortic Atherosclerosis (ICD10-I70.0). Electronically Signed   By: Lovena Le M.D.   On: 03/21/2020 18:16   [place] CHEST (PLEURAL EFFUSION)  Result Date: 03/20/2020 CLINICAL DATA:  Dyspnea. EXAM: CHEST ULTRASOUND - LIMITED COMPARISON:  Chest radiograph 03/20/2020 FINDINGS: Left chest sonography demonstrates a moderate left pleural effusion. We were unable to sit the patient up to perform thoracentesis. The patient was very stiff and even with several caregivers holding the patient, we were not able to hold him in a steady position at the site of the bed adequate to perform thoracentesis. There is too great of a risk of him falling or sliding during thoracentesis due to his rigidity in the hips and legs. IMPRESSION: 1. At least moderate left pleural effusion is visualized sonographically. Thoracentesis was not attempted as the patient could not be positioned adequately. Electronically Signed   By: Cindra Eves.D.  On: 03/20/2020 15:00   DG CHEST PORT 1 VIEW  Result Date: 03/21/2020 CLINICAL DATA:   Hypoxia EXAM: PORTABLE CHEST 1 VIEW COMPARISON:  March 21, 2020 study obtained earlier in the day FINDINGS: Endotracheal tube tip is 4.5 cm above the carina. Nasogastric tube tip is in the proximal stomach with the side port near the gastroesophageal junction, not well seen. No pneumothorax. There is extensive pleural effusion on the left with essentially complete opacification of the left hemithorax. There may well be underlying atelectasis and infiltrate on the left. On the right, there is apparent loculated effusion in the lateral right base. Right lung otherwise clear. Heart is prominent, stable, with evidence of previous median sternotomy. There is aortic atherosclerosis. Pulmonary vascularity on the right appears unremarkable. Pulmonary vascularity on the left is obscured fluid. No adenopathy evident in areas that can be assessed for potential adenopathy. Postoperative change noted in the lower thoracic and lumbar regions. IMPRESSION: Tube and catheter positions as described without pneumothorax. It may be prudent to consider advancing endotracheal tube 4-5 cm to insure that both tube tip and side port are well within the stomach. Extensive opacification on the left with essentially complete opacification of the left hemithorax, likely due to combination of fluid and atelectasis. Superimposed pneumonia on the left cannot be excluded. Probable loculated pleural effusion lateral right base. Right lung otherwise clear. Stable cardiac silhouette. Aortic Atherosclerosis (ICD10-I70.0). Electronically Signed   By: Lowella Grip III M.D.   On: 03/21/2020 15:23   DG CHEST PORT 1 VIEW  Result Date: 03/21/2020 CLINICAL DATA:  Shortness of breath, history CHF, COPD, atrial fibrillation, obesity, was COVID-19 negative on 03/16/2020, post thoracentesis 03/20/2020 EXAM: PORTABLE CHEST 1 VIEW COMPARISON:  Portable exam 1342 hours compared to 03/20/2020 FINDINGS: Rotated to the LEFT. Upper normal size of cardiac  silhouette post median sternotomy. Pulmonary vascular congestion. Bibasilar effusions and atelectasis, significantly decreased on LEFT since previous exam. Minimal pulmonary edema question. No pneumothorax. Bones demineralized with prior thoracolumbar fusion. IMPRESSION: Bibasilar pleural effusions and atelectasis, decreased on LEFT since prior exam. Question minimal pulmonary edema. Electronically Signed   By: Lavonia Dana M.D.   On: 03/21/2020 14:02   DG CHEST PORT 1 VIEW  Result Date: 03/19/2020 CLINICAL DATA:  Worsening dyspnea today, COPD, diabetes mellitus, hypertension, negative COVID-19 test on 03/16/2020 EXAM: PORTABLE CHEST 1 VIEW COMPARISON:  Portable exam 1603 hours compared to 03/16/2020 FINDINGS: Opacification of LEFT hemithorax by pleural effusion and atelectasis increased from prior study. LEFT heart border obscured. Patchy infiltrates mid to lower RIGHT lung with minimal basilar atelectasis and probable small pleural effusion. No pneumothorax. Bones demineralized. IMPRESSION: Patchy RIGHT lung infiltrates with basilar pleural effusion and atelectasis. Opacified LEFT hemithorax by combination of atelectasis and pleural effusion, underlying infiltrate not excluded. Electronically Signed   By: Lavonia Dana M.D.   On: 03/19/2020 16:21   DG Chest Port 1 View  Result Date: 03/16/2020 CLINICAL DATA:  Concern for sepsis. EXAM: PORTABLE CHEST 1 VIEW COMPARISON:  06/17/2019 chest radiograph and prior. FINDINGS: Left basilar/retrocardiac patchy opacities. No pneumothorax. Small left pleural effusion. Cardiomegaly and prominence of the central pulmonary vessels. Post sternotomy sequela. IMPRESSION: Cardiomegaly and central pulmonary vascular congestion. Left basilar opacities. Differential includes infection, edema and atelectasis. Electronically Signed   By: Primitivo Gauze M.D.   On: 03/16/2020 08:14   ECHOCARDIOGRAM COMPLETE  Result Date: 03/18/2020    ECHOCARDIOGRAM REPORT   Patient Name:    Cody Hale Date of Exam: 03/18/2020 Medical Rec #:  [id number]    Height:       69.0 in Accession #:    [id number]   Weight:       240.0 lb Date of Birth:  06/08/1951    BSA:          2.232 m Patient Age:    84 years     BP:           110/68 mmHg Patient Gender: M            HR:           67 bpm. Exam Location:  [facility] Procedure: 2D Echo and Intracardiac Opacification Agent Indications:    Bacteremia R78.81  History:        Patient has prior history of Echocardiogram examinations, most                 recent 05/20/2019. CHF, COPD, Arrythmias:Atrial Fibrillation;                 Risk Factors:Hypertension and Diabetes.                  Mitral Valve: 29 mm St. Jude tissue valve valve is present in                 the mitral position. Procedure Date: 09/07/2012.  Sonographer:    Mikki Santee RDCS (AE) Referring Phys: [id number] COURAGE EMOKPAE IMPRESSIONS  1. Left ventricular ejection fraction, by estimation, is 35 to 40%. The left ventricle has moderately decreased function. The left ventricle demonstrates global hypokinesis. There is mild concentric left ventricular hypertrophy. Left ventricular diastolic parameters are indeterminate.  2. Right ventricular systolic function is normal. The right ventricular size is normal. There is moderately elevated pulmonary artery systolic pressure. The estimated right ventricular systolic pressure is 31.5 mmHg.  3. Left atrial size was severely dilated.  4. The mitral valve has been repaired/replaced. No evidence of mitral valve regurgitation. Moderate to severe mitral stenosis. The mean mitral valve gradient is 11.7 mmHg at 89bpm. There is a 29 mm St. Jude tissue valve present in the mitral position. Procedure Date: 09/07/2012. Echo findings are consistent with normal structure and function of the mitral valve prosthesis.  5. The aortic valve is tricuspid. Aortic valve regurgitation is not visualized. Mild aortic valve sclerosis is present, with no evidence of aortic valve  stenosis.  6. The inferior vena cava is dilated in size with <50% respiratory variability, suggesting right atrial pressure of 15 mmHg.  7. Compared to prior study 05/2018, the mean MV gradient of the MV tissue valve has increased from 8 to 76mHg. FINDINGS  Left Ventricle: Left ventricular ejection fraction, by estimation, is 35 to 40%. The left ventricle has moderately decreased function. The left ventricle demonstrates global hypokinesis. Definity contrast agent was given IV to delineate the left ventricular endocardial borders. The left ventricular internal cavity size was normal in size. There is mild concentric left ventricular hypertrophy. Left ventricular diastolic parameters are indeterminate. Right Ventricle: The right ventricular size is normal. No increase in right ventricular wall thickness. Right ventricular systolic function is normal. There is moderately elevated pulmonary artery systolic pressure. The tricuspid regurgitant velocity is 3.16 m/s, and with an assumed right atrial pressure of 15 mmHg, the estimated right ventricular systolic pressure is 517.6mmHg. Left Atrium: Left atrial size was severely dilated. Right Atrium: Right atrial size was normal in size. Pericardium: There is no evidence of pericardial effusion. Mitral Valve: The mitral valve has  been repaired/replaced. No evidence of mitral valve regurgitation. There is a 29 mm St. Jude tissue valve present in the mitral position. Procedure Date: 09/07/2012. Echo findings are consistent with normal structure and function of the mitral valve prosthesis. Moderate to severe mitral valve stenosis. MV peak gradient, 28.7 mmHg. The mean mitral valve gradient is 11.7 mmHg. Tricuspid Valve: The tricuspid valve is normal in structure. Tricuspid valve regurgitation is mild . No evidence of tricuspid stenosis. Aortic Valve: The aortic valve is tricuspid. Aortic valve regurgitation is not visualized. Mild aortic valve sclerosis is present, with no  evidence of aortic valve stenosis. Pulmonic Valve: The pulmonic valve was normal in structure. Pulmonic valve regurgitation is trivial. No evidence of pulmonic stenosis. Aorta: The aortic root is normal in size and structure. Venous: The inferior vena cava is dilated in size with less than 50% respiratory variability, suggesting right atrial pressure of 15 mmHg. IAS/Shunts: No atrial level shunt detected by color flow Doppler.  LEFT VENTRICLE PLAX 2D LVIDd:         5.42 cm LVIDs:         4.46 cm LV PW:         1.22 cm LV IVS:        1.30 cm LVOT diam:     2.50 cm LV SV:         59 LV SV Index:   26 LVOT Area:     4.91 cm  RIGHT VENTRICLE TAPSE (M-mode): 1.0 cm LEFT ATRIUM              Index       RIGHT ATRIUM           Index LA diam:        5.10 cm  2.28 cm/m  RA Area:     20.30 cm LA Vol (A2C):   121.0 ml 54.20 ml/m RA Volume:   59.20 ml  26.52 ml/m LA Vol (A4C):   114.0 ml 51.06 ml/m LA Biplane Vol: 123.0 ml 55.10 ml/m  AORTIC VALVE LVOT Vmax:   72.10 cm/s LVOT Vmean:  48.067 cm/s LVOT VTI:    0.120 m  AORTA Ao Root diam: 3.60 cm MITRAL VALVE              TRICUSPID VALVE MV Area (PHT): 2.16 cm   TR Peak grad:   39.9 mmHg MV Peak grad:  28.7 mmHg  TR Vmax:        316.00 cm/s MV Mean grad:  11.7 mmHg MV Vmax:       2.68 m/s   SHUNTS MV Vmean:      159.3 cm/s Systemic VTI:  0.12 m                           Systemic Diam: 2.50 cm Fransico Him MD Electronically signed by Fransico Him MD Signature Date/Time: 03/18/2020/4:27:42 PM    Final      CBC Recent Labs  Lab 03/16/20 1022 03/17/20 0449 03/19/20 0840 03/21/20 0554 03/22/20 0332  WBC 16.7* 9.9 6.0 5.9 6.2  HGB 11.7* 11.2* 11.3* 11.8* 10.7*  HCT 38.8* 37.6* 38.0* 40.4 36.1*  PLT 148* 116* 108* 146* 139*  MCV 96.0 98.4 97.2 98.3 96.3  MCH 29.0 29.3 28.9 28.7 28.5  MCHC 30.2 29.8* 29.7* 29.2* 29.6*  RDW 17.1* 17.3* 16.5* 16.1* 16.1*  LYMPHSABS 0.5*  --   --   --   --   MONOABS 1.2*  --   --   --   --  EOSABS 0.0  --   --   --   --     BASOSABS 0.0  --   --   --   --     Chemistries  Recent Labs  Lab 03/16/20 1022 03/17/20 0449 03/19/20 0840 03/21/20 0554 03/21/20 1521 03/21/20 1840 03/22/20 0332 03/22/20 1647  NA 141 141 136 144  --   --  145  --   K 4.2 4.0 4.4 5.0  --   --  4.2  --   CL 99 104 100 106  --   --  108  --   CO2 32 30 29 32  --   --  30  --   GLUCOSE 120* 82 156* 145*  --   --  121*  --   BUN 24* 25* 30* 31*  --   --  35*  --   CREATININE 1.04 0.87 0.72 0.62  --   --  0.62  --   CALCIUM 8.6* 8.3* 8.3* 9.0  --   --  8.5*  --   MG  --   --   --   --  2.3 2.3 2.3 2.0  AST 15  --   --  18  --   --  26  --   ALT 10  --   --  17  --   --  21  --   ALKPHOS 75  --   --  73  --   --  73  --   BILITOT 0.9  --   --  0.8  --   --  0.6  --    ------------------------------------------------------------------------------------------------------------------ Recent Labs    03/22/20 0332  TRIG 92    Lab Results  Component Value Date   HGBA1C 5.6 03/01/2020   Coagulation profile Recent Labs  Lab 03/16/20 1022  INR 2.3*    ------------------------------------------------------------------------------------------------------------------    Component Value Date/Time   BNP 368.9 (H) 06/03/2019 2149     Barton Dubois MD on 03/22/2020 at 7:18 PM  Go to [email or web address] - for contact info  [facility] - Office  [phone]

## 2020-03-23 ENCOUNTER — Inpatient Hospital Stay (HOSPITAL_COMMUNITY): Payer: Medicare Other

## 2020-03-23 DIAGNOSIS — J90 Pleural effusion, not elsewhere classified: Secondary | ICD-10-CM

## 2020-03-23 LAB — CBC
HCT: 34.8 % — ABNORMAL LOW (ref 39.0–52.0)
Hemoglobin: 10.5 g/dL — ABNORMAL LOW (ref 13.0–17.0)
MCH: 28.5 pg (ref 26.0–34.0)
MCHC: 30.2 g/dL (ref 30.0–36.0)
MCV: 94.3 fL (ref 80.0–100.0)
Platelets: 155 10*3/uL (ref 150–400)
RBC: 3.69 MIL/uL — ABNORMAL LOW (ref 4.22–5.81)
RDW: 16.4 % — ABNORMAL HIGH (ref 11.5–15.5)
WBC: 7.3 10*3/uL (ref 4.0–10.5)
nRBC: 0 % (ref 0.0–0.2)

## 2020-03-23 LAB — GLUCOSE, CAPILLARY
Glucose-Capillary: 146 mg/dL — ABNORMAL HIGH (ref 70–99)
Glucose-Capillary: 152 mg/dL — ABNORMAL HIGH (ref 70–99)
Glucose-Capillary: 158 mg/dL — ABNORMAL HIGH (ref 70–99)
Glucose-Capillary: 169 mg/dL — ABNORMAL HIGH (ref 70–99)
Glucose-Capillary: 172 mg/dL — ABNORMAL HIGH (ref 70–99)
Glucose-Capillary: 192 mg/dL — ABNORMAL HIGH (ref 70–99)

## 2020-03-23 LAB — BASIC METABOLIC PANEL
Anion gap: 8 (ref 5–15)
BUN: 35 mg/dL — ABNORMAL HIGH (ref 8–23)
CO2: 32 mmol/L (ref 22–32)
Calcium: 8.1 mg/dL — ABNORMAL LOW (ref 8.9–10.3)
Chloride: 105 mmol/L (ref 98–111)
Creatinine, Ser: 0.59 mg/dL — ABNORMAL LOW (ref 0.61–1.24)
GFR calc Af Amer: >60 mL/min (ref >60)
GFR calc non Af Amer: >60 mL/min (ref >60)
Glucose, Bld: 182 mg/dL — ABNORMAL HIGH (ref 70–99)
Potassium: 3.6 mmol/L (ref 3.5–5.1)
Sodium: 145 mmol/L (ref 135–145)

## 2020-03-23 MED ORDER — FUROSEMIDE 10 MG/ML IJ SOLN
40.0000 mg | Freq: Four times a day (QID) | INTRAMUSCULAR | Status: AC
  Administered 2020-03-23 (×2): 40 mg via INTRAVENOUS
  Filled 2020-03-23 (×2): qty 4

## 2020-03-23 MED ORDER — ACETYLCYSTEINE 20 % IN SOLN
2.0000 mL | Freq: Two times a day (BID) | RESPIRATORY_TRACT | Status: DC
  Administered 2020-03-24: 2 mL via RESPIRATORY_TRACT
  Filled 2020-03-23: qty 4

## 2020-03-23 NOTE — Progress Notes (Signed)
Patient Demographics:    Cody Hale, is a 69 y.o. male, DOB - 20-May-1951, BWL:[id number]  Admit date - 03/16/2020   Admitting Physician Courage Denton Brick, MD  Outpatient Primary MD for the patient is [name], MD  LOS - 7  Chief Complaint  Patient presents with  . Fever        Subjective:    Cody Hale afebrile, no overt bleeding, no nausea, no vomiting.  Vital with improvement in his ventilatory support parameters; remains sedated, intubated and mechanically ventilated   Assessment  & Plan :    Principal Problem:   Sepsis due to pneumonia Active Problems:   OSA (obstructive sleep apnea)   Essential hypertension, malignant   Type II diabetes mellitus with manifestations (HCC)   Chronic systolic CHF (congestive heart failure) (HCC)   Goals of care, counseling/discussion   COPD (chronic obstructive pulmonary disease) with chronic bronchitis (HCC)   Chronic atrial fibrillation   S/P mitral valve replacement with bioprosthetic valve   Major depressive disorder, recurrent severe without psychotic features ([place])   Chronic respiratory failure with hypoxia ([place])   Community acquired pneumonia   Streptococcus G Bacteremia   Endotracheally intubated   Palliative care by specialist  Brief Summary:- 69 y.o. 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, chronic atrial fibrillation with chronic anticoagulation, COPD, diabetes mellitus , h/o T11/L4 fusion after lumbar fracture 11/17/20withWound dehiscence noted12/7/20, foot wound Prior history of dysphagia requiring PEG tube placement 06/04/2019 chronic hypoxic respiratory failure admitted on 03/17/2019 from [facility] with acute on chronic hypoxic respiratory failure secondary to community-acquired pneumonia -Prior H/o Bacterial  Endocarditis --s/p Mitral Valve Repair Previously Now has Strep group G Bacteremia---Needs TEE  A/p 1)Community-acquired Pneumonia---  --In the ED chest x-ray was suggestive of pulmonary venous congestion Versus pneumonia -However given fevers, leukocytosis cough and dyspnea and hypoxia, favor pneumonia -On IV Rocephin and azithromycin initially; has now completed zithromax therapy and remains on rocephin as per ID rec's. -Repeat chest x-ray yesterday showed stable complete opacification of the left hemithorax likely combination of effusion and atelectasis. -aspiration and bilateral pleural effusion also playing a role in his difficulty breathing. -currently intubated and mechanically ventilated; please see below.  2)Strep Group G Sepsis --patient had leukocytosis, tachypnea, tachycardia and fevers --- as well as confusion and hypoxia -- suspect secondary to. -UA not suggestive of UTI -Patient also has right foot wound and sacral wounds do not look particularly infected--please see photos in epic --Lactic acid is not elevated -Continue Rocephin blood cultures from 03/16/20 with strep group G,--patient with prior history of endocarditis repeat TTE from 03/18/2020 noted  repeat blood cultures on 03/19/2020 no growth so far. -WBC 16.7 >>9.9>>6.0>>5.9 -Prior H/o Bacterial Endocarditis --s/p Mitral Valve Repair Previously Now has Strep group G Bacteremia---Needs TEE -If TEE is negative patient may be discharged home on p.o. Keflex to complete 2 weeks of antibiotic therapy for bacteremia (2 weeks from first negative culture) --Phone consult with Dr. Bobby Rumpf from infectious disease 9/14: Unable to do transesophageal echocardiogram because of his respiratory status.  3) chronic anemia----hemoglobin currently above 11 which is higher than recent baseline, -No bleeding concerns at this time -  Continue to follow hemoglobin trend.  4) chronic atrial fibrillation    -on anticoagulation with  Xarelto -Rate controlled overall.  5)Bioprosthetic Mitral Valve Replacement after Endocarditis in 2014--repeat echo from 03/18/20 showed---mitral valve has been repaired/replaced. No evidence of mitral valve regurgitation. Moderate to severe mitral stenosis. The mean mitral valve gradient is 11.7 mmHg at 89bpm. There is a 29 mm St. Jude tissue valve present in the mitral position.  --Cardiology consult for possible TEE requested due to strep group G bacteremia -Unable to do TEE secondary to his respiratory status. -Continue current IV antibiotics.  6)DM2-  -Continue sliding scale insulin -Follow CBGs and adjust hypoglycemic regimen as required.   7) decubitus ulcers--mostly on the buttocks and right foot----wound care consult appreciated, -Continue to follow wound care recommendations and preventive measures -pressure injuries were present on admission.   8) acute on chronic hypoxic respiratory failure--- secondary to #1 above, with aspiration pneumonitis and concern for mucous plugging  -Currently intubated and mechanically ventilated -Ventilator rate 16, PEEP 5,  volume 8 CC per kilogram; FIO2 40% -Continue scheduled bronchodilators, continue IV Lasix to further assist drying long-distance telemedicine volume. -continue mucolytics -continue to wean as tolerated and follow pulmonology recommendation.  9-BPH -continue flomax  Status is: Inpatient   Dispo: The patient is from: ALF  Anticipated d/c is to:  To be determined; but possible will require skilled nursing facility at discharge.  Anticipated d/c date is: Unknown at this time.  Patient currently is not medically stable to d/c. Barriers: Not Clinically Stable-pneumonia/sepsis strep group G bacteremia requiring IV antibiotics.  Also having acute hypoxemic respiratory failure, requiring mechanical ventilation.  Continue current IV antibiotics, follow clinical response and recommendations by  pulmonologist.  Code Status : full  Family Communication: daughter was updated by palliative care service   Consults  :  Phone consult with ID and Cardiologist DVT Prophylaxis  :  Xarelto- SCDs   Lab Results  Component Value Date   PLT 155 03/23/2020    Inpatient Medications  Scheduled Meds: . acetylcysteine  2 mL Nebulization BID  . ascorbic acid  500 mg Per Tube Daily  . carvedilol  3.125 mg Per Tube BID WC  . chlorhexidine gluconate (MEDLINE KIT)  15 mL Mouth Rinse BID  . Chlorhexidine Gluconate Cloth  6 each Topical Daily  . docusate  100 mg Oral BID  . feeding supplement (PROSource TF)  45 mL Per Tube 6 X Daily  . feeding supplement (VITAL HIGH PROTEIN)  1,000 mL Per Tube Q24H  . ferrous sulfate  300 mg Per Tube BID WC  . furosemide  40 mg Intravenous Q6H  . Gerhardt's butt cream   Topical QID  . insulin aspart  0-20 Units Subcutaneous Q4H  . ipratropium-albuterol  3 mL Nebulization Q6H  . mouth rinse  15 mL Mouth Rinse 10 times per day  . metoCLOPramide  5 mg Per Tube Q8H  . multivitamin with minerals  1 tablet Per Tube Daily  . pantoprazole sodium  40 mg Per Tube Q24H  . polyethylene glycol  17 g Oral Daily  . rivaroxaban  20 mg Per Tube Q supper  . rosuvastatin  5 mg Per Tube Daily  . sodium chloride flush  3 mL Intravenous Q12H  . tamsulosin  0.4 mg Oral QPC breakfast  . Vilazodone HCl  40 mg Oral Daily   Continuous Infusions: . sodium chloride    . sodium chloride 20 mL/hr at 03/23/20 1005  . cefTRIAXone (ROCEPHIN)  IV  Stopped (03/23/20 0856)  . propofol (DIPRIVAN) infusion 20 mcg/kg/min (03/23/20 1005)   PRN Meds:.sodium chloride, acetaminophen, albuterol, fentaNYL (SUBLIMAZE) injection, midazolam, [DISCONTINUED] ondansetron **OR** ondansetron (ZOFRAN) IV, polyethylene glycol, sodium chloride flush    Anti-infectives (From admission, onward)   Start     Dose/Rate Route Frequency Ordered Stop   03/17/20 0830  cefTRIAXone (ROCEPHIN) 2 g in sodium chloride  0.9 % 100 mL IVPB        2 g 200 mL/hr over 30 Minutes Intravenous Every 24 hours 03/17/20 0813     03/16/20 1945  cefTRIAXone (ROCEPHIN) 1 g in sodium chloride 0.9 % 100 mL IVPB  Status:  Discontinued        1 g 200 mL/hr over 30 Minutes Intravenous Every 24 hours 03/16/20 1933 03/17/20 0813   03/16/20 1945  azithromycin (ZITHROMAX) 500 mg in sodium chloride 0.9 % 250 mL IVPB  Status:  Discontinued        500 mg 250 mL/hr over 60 Minutes Intravenous Every 24 hours 03/16/20 1933 03/21/20 1206   03/16/20 1115  cefTRIAXone (ROCEPHIN) 1 g in sodium chloride 0.9 % 100 mL IVPB        1 g 200 mL/hr over 30 Minutes Intravenous  Once 03/16/20 1103 03/16/20 1210   03/16/20 1115  azithromycin (ZITHROMAX) 500 mg in sodium chloride 0.9 % 250 mL IVPB        500 mg 250 mL/hr over 60 Minutes Intravenous  Once 03/16/20 1103 03/16/20 1326        Objective:   Vitals:   03/23/20 1136 03/23/20 1200 03/23/20 1351 03/23/20 1512  BP:  (!) 105/35    Pulse: 61 94    Resp: 16 15    Temp: 99.8 F (37.7 C)     TempSrc: Axillary     SpO2: 97% 97% 98% 96%  Weight:      Height:        Wt Readings from Last 3 Encounters:  03/01/20 110.7 kg  02/17/20 102.1 kg  02/10/20 108.9 kg     Intake/Output Summary (Last 24 hours) at 03/23/2020 1622 Last data filed at 03/23/2020 1026 Gross per 24 hour  Intake 2664.39 ml  Output 3950 ml  Net -1285.61 ml    Physical Exam General exam: Afebrile, sedated, intubated on mechanically ventilated; able to open his eyes on command; no nausea, no vomiting, no overt bleeding.  Good urine output recorded. Respiratory system: Positive rhonchi bilaterally; mild expiratory wheezing, decreased breath sounds at the bases.  No using accessory muscles and no breathing above ventilatory rate.  FiO2 40%.  PEEP of 5 Cardiovascular system: Rate controlled, irregular rhythm; no rubs, no gallops, 1+ edema appreciated bilaterally. Gastrointestinal system: Abdomen is obese, nondistended,  soft and nontender. No organomegaly or masses felt. Normal bowel sounds heard. Central nervous system: Unable to properly assess due to ongoing sedation. Extremities: No cyanosis or clubbing. Skin: No rashes, lesions or ulcers Psychiatry: Judgement and insight unable to properly assess due to ongoing sedation at this time    Data Review:   Micro Results Recent Results (from the past 240 hour(s))  Urine culture     Status: Abnormal   Collection Time: 03/16/20  7:39 AM   Specimen: Urine, Catheterized  Result Value Ref Range Status   Specimen Description   Final    URINE, CATHETERIZED Performed at [facility], [address] [place], [place] [address]    Special Requests   Final    NONE Performed at [facility]  [address] [place], [place] [address]    Culture MULTIPLE SPECIES PRESENT, SUGGEST RECOLLECTION (A)  Final   Report Status 03/17/2020 FINAL  Final  SARS Coronavirus 2 by RT PCR (hospital order, performed in [facility] hospital lab) Nasopharyngeal Nasopharyngeal Swab     Status: None   Collection Time: 03/16/20 10:03 AM   Specimen: Nasopharyngeal Swab  Result Value Ref Range Status   SARS Coronavirus 2 NEGATIVE NEGATIVE Final    Comment: (NOTE) SARS-CoV-2 target nucleic acids are NOT DETECTED.  The SARS-CoV-2 RNA is generally detectable in upper and lower respiratory specimens during the acute phase of infection. The lowest concentration of SARS-CoV-2 viral copies this assay can detect is 250 copies / mL. A negative result does not preclude SARS-CoV-2 infection and should not be used as the sole basis for treatment or other patient management decisions.  A negative result may occur with improper specimen collection / handling, submission of specimen other than nasopharyngeal swab, presence of viral mutation(s) within the areas targeted by this assay, and inadequate number of viral copies (<250 copies / mL). A negative result must be combined with  clinical observations, patient history, and epidemiological information.  Fact Sheet for Patients:   StrictlyIdeas.no  Fact Sheet for Healthcare Providers: BankingDealers.co.za  This test is not yet approved or  cleared by the [place] FDA and has been authorized for detection and/or diagnosis of SARS-CoV-2 by FDA under an Emergency Use Authorization (EUA).  This EUA will remain in effect (meaning this test can be used) for the duration of the COVID-19 declaration under Section 564(b)(1) of the Act, 21 U.S.C. section 360bbb-3(b)(1), unless the authorization is terminated or revoked sooner.  Performed at [facility], [address] [place], [place] [address]   Blood Culture (routine x 2)     Status: Abnormal   Collection Time: 03/16/20 10:22 AM   Specimen: BLOOD  Result Value Ref Range Status   Specimen Description   Final    BLOOD LEFT ANTECUBITAL Performed at [facility], [address], [place], [place] [address]    Special Requests   Final    BOTTLES DRAWN AEROBIC ONLY Blood Culture adequate volume Performed at [facility], [address], [place], [place] [address]    Culture  Setup Time   Final    AEROBIC BOTTLE ONLY GRAM POSITIVE COCCI IN CHAINS Gram Stain Report Called to,Read Back By and Verified With: C THOMAS,RN_[email or web address]  03/17/20 MKELLY Organism ID to follow CRITICAL RESULT CALLED TO, READ BACK BY AND VERIFIED WITH: T MAYO RN 03/17/20 0618 JDW Performed at [facility], [address] [place], [place] [address]    Culture STREPTOCOCCUS GROUP G (A)  Final   Report Status 03/19/2020 FINAL  Final   Organism ID, Bacteria STREPTOCOCCUS GROUP G  Final      Susceptibility   Streptococcus group g - MIC*    CLINDAMYCIN <=0.25 SENSITIVE Sensitive     AMPICILLIN <=0.25 SENSITIVE Sensitive     ERYTHROMYCIN <=0.12 SENSITIVE Sensitive     VANCOMYCIN <=0.12 SENSITIVE Sensitive     CEFTRIAXONE <=0.12 SENSITIVE Sensitive      LEVOFLOXACIN 0.5 SENSITIVE Sensitive     PENICILLIN Value in next row Sensitive      SENSITIVE<=0.06    * STREPTOCOCCUS GROUP G  Blood Culture (routine x 2)     Status: None   Collection Time: 03/16/20 10:22 AM   Specimen: BLOOD  Result Value Ref Range Status   Specimen Description BLOOD LEFT  HAND  Final   Special Requests   Final    BOTTLES DRAWN AEROBIC ONLY Blood Culture adequate volume   Culture   Final    NO GROWTH 5 DAYS Performed at [facility], [address] [place], [place] [address]    Report Status 03/21/2020 FINAL  Final  Blood Culture ID Panel (Reflexed)     Status: Abnormal   Collection Time: 03/16/20 10:22 AM  Result Value Ref Range Status   Enterococcus faecalis NOT DETECTED NOT DETECTED Final   Enterococcus Faecium NOT DETECTED NOT DETECTED Final   Listeria monocytogenes NOT DETECTED NOT DETECTED Final   Staphylococcus species NOT DETECTED NOT DETECTED Final   Staphylococcus aureus (BCID) NOT DETECTED NOT DETECTED Final   Staphylococcus epidermidis NOT DETECTED NOT DETECTED Final   Staphylococcus lugdunensis NOT DETECTED NOT DETECTED Final   Streptococcus species DETECTED (A) NOT DETECTED Final    Comment: Not Enterococcus species, Streptococcus agalactiae, Streptococcus pyogenes, or Streptococcus pneumoniae. CRITICAL RESULT CALLED TO, READ BACK BY AND VERIFIED WITH: T MAYO RN 03/17/20 0618 JDW    Streptococcus agalactiae NOT DETECTED NOT DETECTED Final   Streptococcus pneumoniae NOT DETECTED NOT DETECTED Final   Streptococcus pyogenes NOT DETECTED NOT DETECTED Final   A.calcoaceticus-baumannii NOT DETECTED NOT DETECTED Final   Bacteroides fragilis NOT DETECTED NOT DETECTED Final   Enterobacterales NOT DETECTED NOT DETECTED Final   Enterobacter cloacae complex NOT DETECTED NOT DETECTED Final   Escherichia coli NOT DETECTED NOT DETECTED Final   Klebsiella aerogenes NOT DETECTED NOT DETECTED Final   Klebsiella oxytoca NOT DETECTED NOT DETECTED Final    Klebsiella pneumoniae NOT DETECTED NOT DETECTED Final   Proteus species NOT DETECTED NOT DETECTED Final   Salmonella species NOT DETECTED NOT DETECTED Final   Serratia marcescens NOT DETECTED NOT DETECTED Final   Haemophilus influenzae NOT DETECTED NOT DETECTED Final   Neisseria meningitidis NOT DETECTED NOT DETECTED Final   Pseudomonas aeruginosa NOT DETECTED NOT DETECTED Final   Stenotrophomonas maltophilia NOT DETECTED NOT DETECTED Final   Candida albicans NOT DETECTED NOT DETECTED Final   Candida auris NOT DETECTED NOT DETECTED Final   Candida glabrata NOT DETECTED NOT DETECTED Final   Candida krusei NOT DETECTED NOT DETECTED Final   Candida parapsilosis NOT DETECTED NOT DETECTED Final   Candida tropicalis NOT DETECTED NOT DETECTED Final   Cryptococcus neoformans/gattii NOT DETECTED NOT DETECTED Final    Comment: Performed at [facility], [address], [place], [place] [address]  MRSA PCR Screening     Status: None   Collection Time: 03/16/20  8:44 PM   Specimen: Nasopharyngeal  Result Value Ref Range Status   MRSA by PCR NEGATIVE NEGATIVE Final    Comment:        The GeneXpert MRSA Assay (FDA approved for NASAL specimens only), is one component of a comprehensive MRSA colonization surveillance program. It is not intended to diagnose MRSA infection nor to guide or monitor treatment for MRSA infections. Performed at [facility], [address], [place], [place] [address]   Culture, blood (Routine X 2) w Reflex to ID Panel     Status: None (Preliminary result)   Collection Time: 03/19/20  8:44 AM   Specimen: Right Antecubital; Blood  Result Value Ref Range Status   Specimen Description   Final    RIGHT ANTECUBITAL BOTTLES DRAWN AEROBIC AND ANAEROBIC   Special Requests Blood Culture adequate volume  Final   Culture   Final    NO GROWTH 3 DAYS  Performed at [facility], [address], [place], [place] [address]    Report Status PENDING  Incomplete  Culture, blood  (Routine X 2) w Reflex to ID Panel     Status: None (Preliminary result)   Collection Time: 03/19/20  8:44 AM   Specimen: BLOOD RIGHT HAND  Result Value Ref Range Status   Specimen Description   Final    BLOOD RIGHT HAND BOTTLES DRAWN AEROBIC AND ANAEROBIC   Special Requests Blood Culture adequate volume  Final   Culture   Final    NO GROWTH 3 DAYS Performed at [facility], [address] [place], [place] [address]    Report Status PENDING  Incomplete  MRSA PCR Screening     Status: None   Collection Time: 03/21/20  1:20 PM   Specimen: Nasopharyngeal  Result Value Ref Range Status   MRSA by PCR NEGATIVE NEGATIVE Final    Comment:        The GeneXpert MRSA Assay (FDA approved for NASAL specimens only), is one component of a comprehensive MRSA colonization surveillance program. It is not intended to diagnose MRSA infection nor to guide or monitor treatment for MRSA infections. Performed at [facility], [address], [place], [place] [address]   Culture, respiratory     Status: None (Preliminary result)   Collection Time: 03/21/20  3:10 PM   Specimen: Bronchoalveolar Lavage; Respiratory  Result Value Ref Range Status   Specimen Description   Final    BRONCHIAL ALVEOLAR LAVAGE Performed at [facility], [address], [place], [place] [address]    Special Requests   Final    NONE Performed at [facility], [address] [place], [place] [address]    Gram Stain NO WBC SEEN NO ORGANISMS SEEN   Final   Culture   Final    CULTURE REINCUBATED FOR BETTER GROWTH Performed at [facility], [place] [address], [place], [place] [address]    Report Status PENDING  Incomplete    Radiology Reports DG Chest 2 View  Result Date: 03/20/2020 CLINICAL DATA:  Shortness of breath. EXAM: CHEST - 2 VIEW COMPARISON:  March 19, 2020. FINDINGS: There is stable complete opacification of left hemithorax most likely due to a combination of effusion and atelectasis. Sternotomy wires  are noted. Stable right basilar opacity is noted concerning for edema or atelectasis. No pneumothorax is noted. IMPRESSION: Stable complete opacification of left hemithorax most likely due to combination of effusion and atelectasis. Stable right basilar opacity concerning for edema or atelectasis. Electronically Signed   By: Marijo Conception M.D.   On: 03/20/2020 14:41   CT CHEST WO CONTRAST  Result Date: 03/21/2020 CLINICAL DATA:  Shortness of breath, history of CHF, COPD. Thoracentesis 03/21/2019 EXAM: CT CHEST WITHOUT CONTRAST TECHNIQUE: Multidetector CT imaging of the chest was performed following the standard protocol without IV contrast. COMPARISON:  Chest CT 05/19/2018 FINDINGS: Imaging quality is severely degraded due to patient body habitus resulting in extensive photon starvation which also exacerbates the streak artifact from patient's hardware Cardiovascular: Cardiomegaly with four-chamber cardiac enlargement. Dense calcification of mitral annulus. Three-vessel coronary artery atherosclerosis. Focal pericardial thickening towards the apical septum (4/99). Atherosclerotic plaque within the normal caliber aorta. No periaortic stranding or hemorrhage. Normal 3 vessel branching of the aortic arch. Proximal great vessels are calcified but otherwise of grossly unremarkable. Central pulmonary artery enlargement, nonspecific but similar to prior and possibly reflecting some chronic pulmonary hypertension. Luminal evaluation precluded in the absence of contrast  media. Pulmonary vascularity within the lung parenchyma is cephalized and redistributed. Luminal evaluation of the vasculature precluded in the absence of contrast. Mediastinum/Nodes: Mild edematous changes in the mediastinum. Few enlarged and borderline enlarged low-attenuation mediastinal nodes are present including a 14 mm subcarinal node (2/71). No axillary adenopathy. Hilar nodal evaluation is severely limited in the absence intravenous contrast  media. Postsurgical changes in the anterior mediastinum likely related prior sternotomy and CABG. Endotracheal tube terminates low within the trachea, 1.7 cm from the carina. Consider retracting at least 2 cm to the mid trachea. Transesophageal tube is in place. Tip terminates below the level of imaging with but with the side port appropriately positioned distal to the GE junction. Thyroid gland and thoracic inlet are unremarkable. Lungs/Pleura: Moderate to large bilateral pleural effusions, right greater than left with essentially complete atelectatic collapse of both lower lobes, subsegmental collapse of the right middle lobe and lingula as well. Underlying consolidation is not excluded particularly given areas of mixed consolidation and ground-glass within the aerated portions of the upper lungs. Some mild interlobular septal thickening is noted as well. No pneumothorax. Upper Abdomen: Small amount of subdiaphragmatic ascites. Questionable stranding centered upon the pancreatic tail. Upper abdominal atherosclerosis. Musculoskeletal: Appearance of extensive bridging syndesmophytes as well as fusion across the spinous processes with some exaggerated thoracic kyphosis. Could correlate for features ankylosing spondylitis. No acute fracture or suspicious osseous lesions. Prior sternotomy as well. Lower thoracolumbar fusion hardware is noted including vertebral augmentation changes at the T11 and 12 levels. Additional degenerative changes in both shoulders. Moderate body wall edema. IMPRESSION: 1. Imaging quality is severely degraded due to patient body habitus resulting in extensive photon starvation which also exacerbates the streak artifact from patient's hardware. 2. Moderate to large bilateral pleural effusions, right greater than left, with essentially complete atelectatic collapse of both lower lobes, subsegmental collapse of the right middle lobe and lingula as well. Underlying consolidation in the regions of  volume loss is not excluded with evidence of additional areas of consolidation and ground-glass within the aerated portions of the upper lungs concerning for an acute infectious/inflammatory process including atypical viral etiologies. 3. Additional features throughout the chest suggesting CHF/volume overload with pulmonary edema, cardiomegaly, ascites and body wall edema. 4. Dense mitral annular calcifications, could consider nonemergent echocardiography for evaluation of valvulae op a 3. 5. Focal pericardial thickening towards the apical septum. Nonspecific appearance. Could correlate for features of pericarditis or ischemic change. 6. Central pulmonary artery enlargement, nonspecific and incompletely evaluated in the absence of contrast media but similar to prior and possibly reflecting some chronic pulmonary hypertension. 7. Small amount of subdiaphragmatic ascites. 8. Questionable stranding centered upon the pancreatic tail. Possibly redistributed ascites though should correlate with lipase to exclude pancreatitis. 9. Appearance of extensive bridging syndesmophytes as well as fusion across the spinous processes with some exaggerated thoracic kyphosis. Such imaging features can be seen in the setting of ankylosing spondylitis. 10. Aortic Atherosclerosis (ICD10-I70.0). Electronically Signed   By: Lovena Le M.D.   On: 03/21/2020 18:16   [place] CHEST (PLEURAL EFFUSION)  Result Date: 03/20/2020 CLINICAL DATA:  Dyspnea. EXAM: CHEST ULTRASOUND - LIMITED COMPARISON:  Chest radiograph 03/20/2020 FINDINGS: Left chest sonography demonstrates a moderate left pleural effusion. We were unable to sit the patient up to perform thoracentesis. The patient was very stiff and even with several caregivers holding the patient, we were not able to hold him in a steady position at the site of the bed adequate to perform thoracentesis. There is  too great of a risk of him falling or sliding during thoracentesis due to his rigidity in  the hips and legs. IMPRESSION: 1. At least moderate left pleural effusion is visualized sonographically. Thoracentesis was not attempted as the patient could not be positioned adequately. Electronically Signed   By: Van Clines M.D.   On: 03/20/2020 15:00   DG Chest Port 1 View  Result Date: 03/23/2020 CLINICAL DATA:  Respiratory failure. EXAM: PORTABLE CHEST 1 VIEW COMPARISON:  03/21/2020 FINDINGS: The endotracheal tube and NG tubes are stable. Stable mild cardiac enlargement. Significant interval improved aeration of the left lung probable resolution of mucous plugging. Persistent left basilar atelectasis and small bilateral effusions. IMPRESSION: Stable support apparatus. Significant interval improved aeration of the left lung with probable resolution of mucous plugging. Electronically Signed   By: Marijo Sanes M.D.   On: 03/23/2020 07:21   DG CHEST PORT 1 VIEW  Result Date: 03/21/2020 CLINICAL DATA:  Hypoxia EXAM: PORTABLE CHEST 1 VIEW COMPARISON:  March 21, 2020 study obtained earlier in the day FINDINGS: Endotracheal tube tip is 4.5 cm above the carina. Nasogastric tube tip is in the proximal stomach with the side port near the gastroesophageal junction, not well seen. No pneumothorax. There is extensive pleural effusion on the left with essentially complete opacification of the left hemithorax. There may well be underlying atelectasis and infiltrate on the left. On the right, there is apparent loculated effusion in the lateral right base. Right lung otherwise clear. Heart is prominent, stable, with evidence of previous median sternotomy. There is aortic atherosclerosis. Pulmonary vascularity on the right appears unremarkable. Pulmonary vascularity on the left is obscured fluid. No adenopathy evident in areas that can be assessed for potential adenopathy. Postoperative change noted in the lower thoracic and lumbar regions. IMPRESSION: Tube and catheter positions as described without  pneumothorax. It may be prudent to consider advancing endotracheal tube 4-5 cm to insure that both tube tip and side port are well within the stomach. Extensive opacification on the left with essentially complete opacification of the left hemithorax, likely due to combination of fluid and atelectasis. Superimposed pneumonia on the left cannot be excluded. Probable loculated pleural effusion lateral right base. Right lung otherwise clear. Stable cardiac silhouette. Aortic Atherosclerosis (ICD10-I70.0). Electronically Signed   By: Lowella Grip III M.D.   On: 03/21/2020 15:23   DG CHEST PORT 1 VIEW  Result Date: 03/21/2020 CLINICAL DATA:  Shortness of breath, history CHF, COPD, atrial fibrillation, obesity, was COVID-19 negative on 03/16/2020, post thoracentesis 03/20/2020 EXAM: PORTABLE CHEST 1 VIEW COMPARISON:  Portable exam 1342 hours compared to 03/20/2020 FINDINGS: Rotated to the LEFT. Upper normal size of cardiac silhouette post median sternotomy. Pulmonary vascular congestion. Bibasilar effusions and atelectasis, significantly decreased on LEFT since previous exam. Minimal pulmonary edema question. No pneumothorax. Bones demineralized with prior thoracolumbar fusion. IMPRESSION: Bibasilar pleural effusions and atelectasis, decreased on LEFT since prior exam. Question minimal pulmonary edema. Electronically Signed   By: Lavonia Dana M.D.   On: 03/21/2020 14:02   DG CHEST PORT 1 VIEW  Result Date: 03/19/2020 CLINICAL DATA:  Worsening dyspnea today, COPD, diabetes mellitus, hypertension, negative COVID-19 test on 03/16/2020 EXAM: PORTABLE CHEST 1 VIEW COMPARISON:  Portable exam 1603 hours compared to 03/16/2020 FINDINGS: Opacification of LEFT hemithorax by pleural effusion and atelectasis increased from prior study. LEFT heart border obscured. Patchy infiltrates mid to lower RIGHT lung with minimal basilar atelectasis and probable small pleural effusion. No pneumothorax. Bones demineralized. IMPRESSION:  Patchy RIGHT lung  infiltrates with basilar pleural effusion and atelectasis. Opacified LEFT hemithorax by combination of atelectasis and pleural effusion, underlying infiltrate not excluded. Electronically Signed   By: Lavonia Dana M.D.   On: 03/19/2020 16:21   DG Chest Port 1 View  Result Date: 03/16/2020 CLINICAL DATA:  Concern for sepsis. EXAM: PORTABLE CHEST 1 VIEW COMPARISON:  06/17/2019 chest radiograph and prior. FINDINGS: Left basilar/retrocardiac patchy opacities. No pneumothorax. Small left pleural effusion. Cardiomegaly and prominence of the central pulmonary vessels. Post sternotomy sequela. IMPRESSION: Cardiomegaly and central pulmonary vascular congestion. Left basilar opacities. Differential includes infection, edema and atelectasis. Electronically Signed   By: Primitivo Gauze M.D.   On: 03/16/2020 08:14   ECHOCARDIOGRAM COMPLETE  Result Date: 03/18/2020    ECHOCARDIOGRAM REPORT   Patient Name:   KAIRON SHOCK Date of Exam: 03/18/2020 Medical Rec #:  [id number]    Height:       69.0 in Accession #:    [id number]   Weight:       240.0 lb Date of Birth:  02-24-1951    BSA:          2.232 m Patient Age:    69 years     BP:           110/68 mmHg Patient Gender: M            HR:           67 bpm. Exam Location:  [facility] Procedure: 2D Echo and Intracardiac Opacification Agent Indications:    Bacteremia R78.81  History:        Patient has prior history of Echocardiogram examinations, most                 recent 05/20/2019. CHF, COPD, Arrythmias:Atrial Fibrillation;                 Risk Factors:Hypertension and Diabetes.                  Mitral Valve: 29 mm St. Jude tissue valve valve is present in                 the mitral position. Procedure Date: 09/07/2012.  Sonographer:    Mikki Santee RDCS (AE) Referring Phys: [id number] COURAGE EMOKPAE IMPRESSIONS  1. Left ventricular ejection fraction, by estimation, is 35 to 40%. The left ventricle has moderately decreased function. The left ventricle  demonstrates global hypokinesis. There is mild concentric left ventricular hypertrophy. Left ventricular diastolic parameters are indeterminate.  2. Right ventricular systolic function is normal. The right ventricular size is normal. There is moderately elevated pulmonary artery systolic pressure. The estimated right ventricular systolic pressure is 92.4 mmHg.  3. Left atrial size was severely dilated.  4. The mitral valve has been repaired/replaced. No evidence of mitral valve regurgitation. Moderate to severe mitral stenosis. The mean mitral valve gradient is 11.7 mmHg at 89bpm. There is a 29 mm St. Jude tissue valve present in the mitral position. Procedure Date: 09/07/2012. Echo findings are consistent with normal structure and function of the mitral valve prosthesis.  5. The aortic valve is tricuspid. Aortic valve regurgitation is not visualized. Mild aortic valve sclerosis is present, with no evidence of aortic valve stenosis.  6. The inferior vena cava is dilated in size with <50% respiratory variability, suggesting right atrial pressure of 15 mmHg.  7. Compared to prior study 05/2018, the mean MV gradient of the MV tissue valve has increased from 8 to 53mHg. FINDINGS  Left Ventricle:  Left ventricular ejection fraction, by estimation, is 35 to 40%. The left ventricle has moderately decreased function. The left ventricle demonstrates global hypokinesis. Definity contrast agent was given IV to delineate the left ventricular endocardial borders. The left ventricular internal cavity size was normal in size. There is mild concentric left ventricular hypertrophy. Left ventricular diastolic parameters are indeterminate. Right Ventricle: The right ventricular size is normal. No increase in right ventricular wall thickness. Right ventricular systolic function is normal. There is moderately elevated pulmonary artery systolic pressure. The tricuspid regurgitant velocity is 3.16 m/s, and with an assumed right atrial  pressure of 15 mmHg, the estimated right ventricular systolic pressure is 29.9 mmHg. Left Atrium: Left atrial size was severely dilated. Right Atrium: Right atrial size was normal in size. Pericardium: There is no evidence of pericardial effusion. Mitral Valve: The mitral valve has been repaired/replaced. No evidence of mitral valve regurgitation. There is a 29 mm St. Jude tissue valve present in the mitral position. Procedure Date: 09/07/2012. Echo findings are consistent with normal structure and function of the mitral valve prosthesis. Moderate to severe mitral valve stenosis. MV peak gradient, 28.7 mmHg. The mean mitral valve gradient is 11.7 mmHg. Tricuspid Valve: The tricuspid valve is normal in structure. Tricuspid valve regurgitation is mild . No evidence of tricuspid stenosis. Aortic Valve: The aortic valve is tricuspid. Aortic valve regurgitation is not visualized. Mild aortic valve sclerosis is present, with no evidence of aortic valve stenosis. Pulmonic Valve: The pulmonic valve was normal in structure. Pulmonic valve regurgitation is trivial. No evidence of pulmonic stenosis. Aorta: The aortic root is normal in size and structure. Venous: The inferior vena cava is dilated in size with less than 50% respiratory variability, suggesting right atrial pressure of 15 mmHg. IAS/Shunts: No atrial level shunt detected by color flow Doppler.  LEFT VENTRICLE PLAX 2D LVIDd:         5.42 cm LVIDs:         4.46 cm LV PW:         1.22 cm LV IVS:        1.30 cm LVOT diam:     2.50 cm LV SV:         59 LV SV Index:   26 LVOT Area:     4.91 cm  RIGHT VENTRICLE TAPSE (M-mode): 1.0 cm LEFT ATRIUM              Index       RIGHT ATRIUM           Index LA diam:        5.10 cm  2.28 cm/m  RA Area:     20.30 cm LA Vol (A2C):   121.0 ml 54.20 ml/m RA Volume:   59.20 ml  26.52 ml/m LA Vol (A4C):   114.0 ml 51.06 ml/m LA Biplane Vol: 123.0 ml 55.10 ml/m  AORTIC VALVE LVOT Vmax:   72.10 cm/s LVOT Vmean:  48.067 cm/s LVOT VTI:     0.120 m  AORTA Ao Root diam: 3.60 cm MITRAL VALVE              TRICUSPID VALVE MV Area (PHT): 2.16 cm   TR Peak grad:   39.9 mmHg MV Peak grad:  28.7 mmHg  TR Vmax:        316.00 cm/s MV Mean grad:  11.7 mmHg MV Vmax:       2.68 m/s   SHUNTS MV Vmean:      159.3 cm/s Systemic  VTI:  0.12 m                           Systemic Diam: 2.50 cm Fransico Him MD Electronically signed by Fransico Him MD Signature Date/Time: 03/18/2020/4:27:42 PM    Final      CBC Recent Labs  Lab 03/17/20 0449 03/19/20 0840 03/21/20 0554 03/22/20 0332 03/23/20 0307  WBC 9.9 6.0 5.9 6.2 7.3  HGB 11.2* 11.3* 11.8* 10.7* 10.5*  HCT 37.6* 38.0* 40.4 36.1* 34.8*  PLT 116* 108* 146* 139* 155  MCV 98.4 97.2 98.3 96.3 94.3  MCH 29.3 28.9 28.7 28.5 28.5  MCHC 29.8* 29.7* 29.2* 29.6* 30.2  RDW 17.3* 16.5* 16.1* 16.1* 16.4*    Chemistries  Recent Labs  Lab 03/17/20 0449 03/19/20 0840 03/21/20 0554 03/21/20 1521 03/21/20 1840 03/22/20 0332 03/22/20 1647 03/23/20 0307  NA 141 136 144  --   --  145  --  145  K 4.0 4.4 5.0  --   --  4.2  --  3.6  CL 104 100 106  --   --  108  --  105  CO2 30 29 32  --   --  30  --  32  GLUCOSE 82 156* 145*  --   --  121*  --  182*  BUN 25* 30* 31*  --   --  35*  --  35*  CREATININE 0.87 0.72 0.62  --   --  0.62  --  0.59*  CALCIUM 8.3* 8.3* 9.0  --   --  8.5*  --  8.1*  MG  --   --   --  2.3 2.3 2.3 2.0  --   AST  --   --  18  --   --  26  --   --   ALT  --   --  17  --   --  21  --   --   ALKPHOS  --   --  73  --   --  73  --   --   BILITOT  --   --  0.8  --   --  0.6  --   --    ------------------------------------------------------------------------------------------------------------------ Recent Labs    03/22/20 0332  TRIG 92    Lab Results  Component Value Date   HGBA1C 5.6 03/01/2020   Coagulation profile No results for input(s): INR, PROTIME in the last 168  hours.  ------------------------------------------------------------------------------------------------------------------    Component Value Date/Time   BNP 368.9 (H) 06/03/2019 2149   Time spent: 40 minutes.  Barton Dubois MD on 03/23/2020 at 4:22 PM  Go to [email or web address] - for contact info  [facility] - Office  [phone]

## 2020-03-23 NOTE — Progress Notes (Signed)
NAMEJasani Hale, MRN:  [id number], DOB:  Feb 15, 1951, LOS: 7 ADMISSION DATE:  03/16/2020, CONSULTATION DATE:  03/21/2020 REFERRING MD:  Dr. Barth Kirks, CHIEF COMPLAINT:  Hypoxia   Brief History   69 yo male former smoker brought to ER from [facility] with fever 105F and wound on Rt lower leg and sacrum.  Found to have pneumonia, sepsis, and bacteremia.  Developed progressive hypoxia with respiratory distress from aspiration and mucus plugging.  Transferred to ICU and required intubation.  Past Medical History  OSA, PSVT, Nephrolithiasis, Depression, LBBB, Bioprosthetic mitral valve, Gastroparesis, HTN, Endocarditis 2014, DM type 2, DJD, CAD, COPD, A fib, systolic CHF  Significant Hospital Events   9/09 Admit 9/14 transfer to ICU, intubated  Consults:    Procedures:  ETT 9/14 >>   Significant Diagnostic Tests:   Echo 03/18/20 >> EF 35 to 40%, RVSP 54.9 mmHg, s/p MVR  Bronchoscopy 03/21/20 >> mucus plug Lt main bronchus  CT chest 9/14//21 >> b/l consolidation and ATX, b/l effusions  Micro Data:  COVID 9/09 >> negative MRSA PCR 9/09 >> negative Blood 9/09 >> Group G Streptococcus Blood 9/12 >> BAL 9/14 >>   Antimicrobials:  Zithromax 9/09 >> 9/13 Rocephin 9/09 >>   Interim history/subjective:  Good response to lasix yesterday.  Low Vt with pressure support.  Objective   Blood pressure (!) 105/35, pulse 94, temperature 99.8 F (37.7 C), temperature source Axillary, resp. rate 15, height _0  (1.753 m), weight 115.7 kg, SpO2 98 %.    Vent Mode: PRVC FiO2 (%):  [40 %-60 %] 40 % Set Rate:  [15 bmp-16 bmp] 16 bmp Vt Set:  [560 mL] 560 mL PEEP:  [5 cmH20-10 cmH20] 5 cmH20 Pressure Support:  [10 cmH20] 10 cmH20 Plateau Pressure:  [18 cmH20-30 cmH20] 18 cmH20   Intake/Output Summary (Last 24 hours) at 03/23/2020 1434 Last data filed at 03/23/2020 1026 Gross per 24 hour  Intake 2664.39 ml  Output 3950 ml  Net -1285.61 ml   Filed Weights   03/18/20 0500 03/21/20 1410    Weight: 115.4 kg 115.7 kg    Examination:  General - sedated Eyes - pupils reactive ENT - ETT in place Cardiac - regular rate/rhythm, no murmur Chest - better air movement, decreased BS at bases Abdomen - soft, non tender, + bowel sounds Extremities - 1+ edema Skin - Rt lower leg in wrap Neuro - RASS 0, moves upper extremities, follows commands  Resolved Hospital Problem list     Assessment & Plan:   Acute hypoxic respiratory failure from aspiration pneumonitis with mucus plugging and b/l pleural effusions. Hx of COPD. - pressure support as able; not ready for extubation trial yet - lasix 40 mg IV x 2 doses - f/u CXR intermittently - scheduled BDs  Group G Streptococcal bacteremia. - ABx per primary team  S/p Mitral valve replacement with concern for endocarditis. Hx of A fib, HTN, acute on chronic systolic CHF, CAD. - continue lasix - continue crestor, crestor, xarelto  DM type II poorly controlled with hyperglycemia. - SSI  Pressure ulcers. - sacrum and Rt foot, both present prior to admission - wound care  Acute metabolic encephalopathy from hypoxia, sepsis. - RASS goal 0 to -1  Best practice:  Diet: tube feeds DVT prophylaxis: xarelto GI prophylaxis: protonix Mobility: bed rest Code Status: full code Disposition: ICU  Labs    CMP Latest Ref Rng & Units 03/23/2020 03/22/2020 03/21/2020  Glucose 70 - 99 mg/dL 182(H) 121(H) 145(H)  BUN  8 - 23 mg/dL 35(H) 35(H) 31(H)  Creatinine 0.61 - 1.24 mg/dL 0.59(L) 0.62 0.62  Sodium 135 - 145 mmol/L 145 145 144  Potassium 3.5 - 5.1 mmol/L 3.6 4.2 5.0  Chloride 98 - 111 mmol/L 105 108 106  CO2 22 - 32 mmol/L 32 30 32  Calcium 8.9 - 10.3 mg/dL 8.1(L) 8.5(L) 9.0  Total Protein 6.5 - 8.1 g/dL - 5.1(L) 5.8(L)  Total Bilirubin 0.3 - 1.2 mg/dL - 0.6 0.8  Alkaline Phos 38 - 126 U/L - 73 73  AST 15 - 41 U/L - 26 18  ALT 0 - 44 U/L - 21 17    CBC Latest Ref Rng & Units 03/23/2020 03/22/2020 03/21/2020  WBC 4.0 - 10.5  K/uL 7.3 6.2 5.9  Hemoglobin 13.0 - 17.0 g/dL 10.5(L) 10.7(L) 11.8(L)  Hematocrit 39 - 52 % 34.8(L) 36.1(L) 40.4  Platelets 150 - 400 K/uL 155 139(L) 146(L)    ABG    Component Value Date/Time   PHART 7.385 03/21/2020 1730   PCO2ART 53.6 (H) 03/21/2020 1730   PO2ART 103 03/21/2020 1730   HCO3 29.5 (H) 03/21/2020 1730   TCO2 30 05/27/2019 0902   ACIDBASEDEF 1.7 03/02/2018 2230   O2SAT 97.7 03/21/2020 1730    CBG (last 3)  Recent Labs    03/23/20 0457 03/23/20 0755 03/23/20 1138  GLUCAP 192* 169* 158*    Critical care time: 32 minutes  Chesley Mires, MD [place] Pager - [phone] 03/23/2020, 2:34 PM

## 2020-03-24 DIAGNOSIS — J9601 Acute respiratory failure with hypoxia: Secondary | ICD-10-CM

## 2020-03-24 DIAGNOSIS — R0600 Dyspnea, unspecified: Secondary | ICD-10-CM

## 2020-03-24 DIAGNOSIS — R0902 Hypoxemia: Secondary | ICD-10-CM

## 2020-03-24 DIAGNOSIS — J449 Chronic obstructive pulmonary disease, unspecified: Secondary | ICD-10-CM

## 2020-03-24 DIAGNOSIS — R0689 Other abnormalities of breathing: Secondary | ICD-10-CM

## 2020-03-24 LAB — CULTURE, RESPIRATORY W GRAM STAIN
Culture: NORMAL
Gram Stain: NONE SEEN

## 2020-03-24 LAB — BASIC METABOLIC PANEL
Anion gap: 7 (ref 5–15)
BUN: 31 mg/dL — ABNORMAL HIGH (ref 8–23)
CO2: 34 mmol/L — ABNORMAL HIGH (ref 22–32)
Calcium: 7.8 mg/dL — ABNORMAL LOW (ref 8.9–10.3)
Chloride: 104 mmol/L (ref 98–111)
Creatinine, Ser: 0.55 mg/dL — ABNORMAL LOW (ref 0.61–1.24)
GFR calc Af Amer: >60 mL/min (ref >60)
GFR calc non Af Amer: >60 mL/min (ref >60)
Glucose, Bld: 150 mg/dL — ABNORMAL HIGH (ref 70–99)
Potassium: 3.2 mmol/L — ABNORMAL LOW (ref 3.5–5.1)
Sodium: 145 mmol/L (ref 135–145)

## 2020-03-24 LAB — CULTURE, BLOOD (ROUTINE X 2)
Culture: NO GROWTH
Culture: NO GROWTH
Special Requests: ADEQUATE
Special Requests: ADEQUATE

## 2020-03-24 LAB — GLUCOSE, CAPILLARY
Glucose-Capillary: 112 mg/dL — ABNORMAL HIGH (ref 70–99)
Glucose-Capillary: 127 mg/dL — ABNORMAL HIGH (ref 70–99)
Glucose-Capillary: 147 mg/dL — ABNORMAL HIGH (ref 70–99)
Glucose-Capillary: 148 mg/dL — ABNORMAL HIGH (ref 70–99)
Glucose-Capillary: 150 mg/dL — ABNORMAL HIGH (ref 70–99)
Glucose-Capillary: 153 mg/dL — ABNORMAL HIGH (ref 70–99)

## 2020-03-24 MED ORDER — FUROSEMIDE 10 MG/ML IJ SOLN
40.0000 mg | Freq: Once | INTRAMUSCULAR | Status: AC
  Administered 2020-03-24: 40 mg via INTRAVENOUS
  Filled 2020-03-24: qty 4

## 2020-03-24 MED ORDER — INFLUENZA VAC A&B SA ADJ QUAD 0.5 ML IM PRSY
0.5000 mL | PREFILLED_SYRINGE | INTRAMUSCULAR | Status: DC
  Filled 2020-03-24: qty 0.5

## 2020-03-24 MED ORDER — POTASSIUM CHLORIDE 10 MEQ/100ML IV SOLN
10.0000 meq | INTRAVENOUS | Status: AC
  Administered 2020-03-24 (×2): 10 meq via INTRAVENOUS
  Filled 2020-03-24 (×2): qty 100

## 2020-03-24 NOTE — Progress Notes (Signed)
Patient Demographics:    Cody Hale, is a 69 y.o. male, DOB - 10-Aug-1950, QQP:[id number]  Admit date - 03/16/2020   Admitting Physician Courage Denton Brick, MD  Outpatient Primary MD for the patient is Cody Lima, MD  LOS - 8  Chief Complaint  Patient presents with  . Fever        Subjective:    Cody Hale hemodynamically stable; no fever, overt bleeding, no nausea, no vomiting.  Patient is more alert, following commands, weaning and having good SBT.  Hopefully extubated later today.   Assessment  & Plan :    Principal Problem:   Sepsis due to pneumonia Active Problems:   OSA (obstructive sleep apnea)   Essential hypertension, malignant   Type II diabetes mellitus with manifestations (HCC)   Chronic systolic CHF (congestive heart failure) (HCC)   Goals of care, counseling/discussion   COPD (chronic obstructive pulmonary disease) with chronic bronchitis (HCC)   Chronic atrial fibrillation   S/P mitral valve replacement with bioprosthetic valve   Major depressive disorder, recurrent severe without psychotic features ([name])   Chronic respiratory failure with hypoxia ([name])   Community acquired pneumonia   Streptococcus G Bacteremia   Endotracheally intubated   Palliative care by specialist  Brief Summary:- 69 y.o. 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, chronic atrial fibrillation with chronic anticoagulation, COPD, diabetes mellitus , h/o T11/L4 fusion after lumbar fracture 11/17/20withWound dehiscence noted12/7/20, foot wound Prior history of dysphagia requiring PEG tube placement 06/04/2019 chronic hypoxic respiratory failure admitted on 03/17/2019 from [facility] ALF with acute on chronic hypoxic respiratory failure secondary to community-acquired pneumonia -Prior H/o  Bacterial Endocarditis --s/p Mitral Valve Repair Previously Now has Strep group G Bacteremia---Needs TEE  A/p 1)Community-acquired Pneumonia---  --In the ED chest x-ray was suggestive of pulmonary venous congestion Versus pneumonia -However given fevers, leukocytosis cough and dyspnea and hypoxia, favor pneumonia -On IV Rocephin and azithromycin initially; has now completed zithromax therapy and remains on rocephin as per ID rec's. -Repeat chest x-ray yesterday showed stable complete opacification of the left hemithorax likely combination of effusion and atelectasis. -aspiration and bilateral pleural effusion also playing a role in his difficulty breathing. -currently intubated and mechanically ventilated; please see below.  2)Strep Group G Sepsis --patient had leukocytosis, tachypnea, tachycardia and fevers --- as well as confusion and hypoxia -- suspect secondary to. -UA not suggestive of UTI -Patient also has right foot wound and sacral wounds do not look particularly infected--please see photos in epic --Lactic acid is not elevated -Continue Rocephin blood cultures from 03/16/20 with strep group G,--patient with prior history of endocarditis repeat TTE from 03/18/2020 noted  repeat blood cultures on 03/19/2020 no growth so far. -WBC 16.7 >>9.9>>6.0>>5.9 -Prior H/o Bacterial Endocarditis --s/p Mitral Valve Repair Previously Now has Strep group G Bacteremia---Needs TEE -If TEE is negative patient may be discharged home on p.o. Keflex to complete 2 weeks of antibiotic therapy for bacteremia (2 weeks from first negative culture) --Phone consult with Dr. Bobby Rumpf from infectious disease -TEE still pending at this time.  3) chronic anemia----hemoglobin currently above 11 which is higher than recent baseline, -No bleeding concerns at this time -Continue  to follow hemoglobin trend.  4) chronic atrial fibrillation    -on anticoagulation with Xarelto -Rate controlled  overall.  5)Bioprosthetic Mitral Valve Replacement after Endocarditis in 2014--repeat echo from 03/18/20 showed---mitral valve has been repaired/replaced. No evidence of mitral valve regurgitation. Moderate to severe mitral stenosis. The mean mitral valve gradient is 11.7 mmHg at 89bpm. There is a 29 mm St. Jude tissue valve present in the mitral position.  --Cardiology consult for possible TEE requested due to strep group G bacteremia -Unable to do TEE secondary to his respiratory status. -Continue current IV antibiotics.  6)DM2-  -Continue sliding scale insulin -Follow CBGs and adjust hypoglycemic regimen as required.   7) decubitus ulcers--mostly on the buttocks and right foot----wound care consult appreciated, -Continue to follow wound care recommendations and preventive measures -pressure injuries were present on admission.   8) acute on chronic hypoxic respiratory failure--- secondary to #1 above, with aspiration pneumonitis and concern for mucous plugging  -Currently intubated and mechanically ventilated -Ventilator rate 16, PEEP 5,  volume 8 CC per kilogram; FIO2 40% -Continue scheduled bronchodilators, continue IV Lasix to further assist drying lungs -Continue to follow recommendations by pulmonology service.   -continue mucolytics -Excellent weaning and SBT so far; anticipate probably extubation later today.  9-BPH -continue flomax  Status is: Inpatient   Dispo: The patient is from: ALF  Anticipated d/c is to:  To be determined; but possible will require skilled nursing facility at discharge.  Anticipated d/c date is: Unknown at this time.  Patient currently is not medically stable to d/c. Barriers: Not Clinically Stable-pneumonia/sepsis strep group G bacteremia requiring IV antibiotics.  Also having acute hypoxemic respiratory failure, requiring mechanical ventilation.  Continue current IV antibiotics, follow clinical response and  recommendations by pulmonologist.  Code Status : full  Family Communication: daughter was updated by palliative care service   Consults  :  Phone consult with ID and Cardiologist DVT Prophylaxis  :  Xarelto- SCDs   Lab Results  Component Value Date   PLT 155 03/23/2020    Inpatient Medications  Scheduled Meds: . ascorbic acid  500 mg Per Tube Daily  . carvedilol  3.125 mg Per Tube BID WC  . chlorhexidine gluconate (MEDLINE KIT)  15 mL Mouth Rinse BID  . Chlorhexidine Gluconate Cloth  6 each Topical Daily  . docusate  100 mg Oral BID  . feeding supplement (PROSource TF)  45 mL Per Tube 6 X Daily  . ferrous sulfate  300 mg Per Tube BID WC  . Gerhardt's butt cream   Topical QID  . [START ON 03/27/2020] influenza vaccine adjuvanted  0.5 mL Intramuscular Tomorrow-1000  . insulin aspart  0-20 Units Subcutaneous Q4H  . ipratropium-albuterol  3 mL Nebulization Q6H  . mouth rinse  15 mL Mouth Rinse 10 times per day  . metoCLOPramide  5 mg Per Tube Q8H  . multivitamin with minerals  1 tablet Per Tube Daily  . pantoprazole sodium  40 mg Per Tube Q24H  . polyethylene glycol  17 g Oral Daily  . rivaroxaban  20 mg Per Tube Q supper  . rosuvastatin  5 mg Per Tube Daily  . sodium chloride flush  3 mL Intravenous Q12H  . tamsulosin  0.4 mg Oral QPC breakfast  . Vilazodone HCl  40 mg Oral Daily   Continuous Infusions: . sodium chloride    . cefTRIAXone (ROCEPHIN)  IV 2 g (03/24/20 0906)   PRN Meds:.sodium chloride, acetaminophen, albuterol, [DISCONTINUED] ondansetron **OR** ondansetron (ZOFRAN) IV,  polyethylene glycol, sodium chloride flush    Anti-infectives (From admission, onward)   Start     Dose/Rate Route Frequency Ordered Stop   03/17/20 0830  cefTRIAXone (ROCEPHIN) 2 g in sodium chloride 0.9 % 100 mL IVPB        2 g 200 mL/hr over 30 Minutes Intravenous Every 24 hours 03/17/20 0813     03/16/20 1945  cefTRIAXone (ROCEPHIN) 1 g in sodium chloride 0.9 % 100 mL IVPB  Status:   Discontinued        1 g 200 mL/hr over 30 Minutes Intravenous Every 24 hours 03/16/20 1933 03/17/20 0813   03/16/20 1945  azithromycin (ZITHROMAX) 500 mg in sodium chloride 0.9 % 250 mL IVPB  Status:  Discontinued        500 mg 250 mL/hr over 60 Minutes Intravenous Every 24 hours 03/16/20 1933 03/21/20 1206   03/16/20 1115  cefTRIAXone (ROCEPHIN) 1 g in sodium chloride 0.9 % 100 mL IVPB        1 g 200 mL/hr over 30 Minutes Intravenous  Once 03/16/20 1103 03/16/20 1210   03/16/20 1115  azithromycin (ZITHROMAX) 500 mg in sodium chloride 0.9 % 250 mL IVPB        500 mg 250 mL/hr over 60 Minutes Intravenous  Once 03/16/20 1103 03/16/20 1326        Objective:   Vitals:   03/24/20 1600 03/24/20 1700 03/24/20 1800 03/24/20 1900  BP: (!) 139/56 (!) 123/39 (!) 129/45 (!) 132/44  Pulse: 66 94 70 69  Resp: (!) 24 15 (!) 23 (!) 21  Temp: 97.9 F (36.6 C)     TempSrc: Oral     SpO2: 95% 96% 95% 98%  Weight:      Height:        Wt Readings from Last 3 Encounters:  03/01/20 110.7 kg  02/17/20 102.1 kg  02/10/20 108.9 kg     Intake/Output Summary (Last 24 hours) at 03/24/2020 1920 Last data filed at 03/24/2020 1712 Gross per 24 hour  Intake 398.3 ml  Output 4450 ml  Net -4051.7 ml    Physical Exam General exam: Intubated, mechanically ventilated; RASS of 0, following commands and able to communicate properly.  So far good urine output has been reported, improved air movement bilaterally, with good weaning and SBT process.  Will most likely be extubated later today. Respiratory system: Improved air movement bilaterally; positive rhonchi right, no wheezing appreciated during examination.  Decreased breath sounds at the bases without frank crackles. Cardiovascular system: RRR. No rubs or gallops; irregular rhythm, positive murmur appreciated on exam.  Trace to 1+ edema bilaterally.   Gastrointestinal system: Abdomen is obese, nondistended, soft and nontender. No organomegaly or masses  felt. Normal bowel sounds heard. Central nervous system: Alert and following simple commands.  No focal neurological deficits. Extremities: No cyanosis or clubbing. Skin: No rashes, no petechiae.  On change pressure injuries especially affecting right foot and decubitus area; no signs of superimposed infection. Psychiatry: Limited evaluation; but appear to have appropriate mood & affect.     Data Review:   Micro Results Recent Results (from the past 240 hour(s))  Urine culture     Status: Abnormal   Collection Time: 03/16/20  7:39 AM   Specimen: Urine, Catheterized  Result Value Ref Range Status   Specimen Description   Final    URINE, CATHETERIZED Performed at [facility], [address] [place], [place] [address]    Special Requests   Final  NONE Performed at [facility], [address] [place], [place] [address]    Culture MULTIPLE SPECIES PRESENT, SUGGEST RECOLLECTION (A)  Final   Report Status 03/17/2020 FINAL  Final  SARS Coronavirus 2 by RT PCR (hospital order, performed in [facility] hospital lab) Nasopharyngeal Nasopharyngeal Swab     Status: None   Collection Time: 03/16/20 10:03 AM   Specimen: Nasopharyngeal Swab  Result Value Ref Range Status   SARS Coronavirus 2 NEGATIVE NEGATIVE Final    Comment: (NOTE) SARS-CoV-2 target nucleic acids are NOT DETECTED.  The SARS-CoV-2 RNA is generally detectable in upper and lower respiratory specimens during the acute phase of infection. The lowest concentration of SARS-CoV-2 viral copies this assay can detect is 250 copies / mL. A negative result does not preclude SARS-CoV-2 infection and should not be used as the sole basis for treatment or other patient management decisions.  A negative result may occur with improper specimen collection / handling, submission of specimen other than nasopharyngeal swab, presence of viral mutation(s) within the areas targeted by this assay, and inadequate number of viral copies (<250  copies / mL). A negative result must be combined with clinical observations, patient history, and epidemiological information.  Fact Sheet for Patients:   StrictlyIdeas.no  Fact Sheet for Healthcare Providers: BankingDealers.co.za  This test is not yet approved or  cleared by the [place] FDA and has been authorized for detection and/or diagnosis of SARS-CoV-2 by FDA under an Emergency Use Authorization (EUA).  This EUA will remain in effect (meaning this test can be used) for the duration of the COVID-19 declaration under Section 564(b)(1) of the Act, 21 U.S.C. section 360bbb-3(b)(1), unless the authorization is terminated or revoked sooner.  Performed at [facility], [address], [place], [place] [address]   Blood Culture (routine x 2)     Status: Abnormal   Collection Time: 03/16/20 10:22 AM   Specimen: BLOOD  Result Value Ref Range Status   Specimen Description   Final    BLOOD LEFT ANTECUBITAL Performed at [facility], [address] [place] , [place] [address]    Special Requests   Final    BOTTLES DRAWN AEROBIC ONLY Blood Culture adequate volume Performed at [facility], [address] [place], [place] [address]    Culture  Setup Time   Final    AEROBIC BOTTLE ONLY GRAM POSITIVE COCCI IN CHAINS Gram Stain Report Called to,Read Back By and Verified With: C THOMAS,[email or web address]  03/17/20 MKELLY Organism ID to follow CRITICAL RESULT CALLED TO, READ BACK BY AND VERIFIED WITH: T MAYO RN 03/17/20 0618 JDW Performed at [facility], [address] [place], [place] [address]    Culture STREPTOCOCCUS GROUP G (A)  Final   Report Status 03/19/2020 FINAL  Final   Organism ID, Bacteria STREPTOCOCCUS GROUP G  Final      Susceptibility   Streptococcus group g - MIC*    CLINDAMYCIN <=0.25 SENSITIVE Sensitive     AMPICILLIN <=0.25 SENSITIVE Sensitive     ERYTHROMYCIN <=0.12 SENSITIVE Sensitive     VANCOMYCIN <=0.12 SENSITIVE  Sensitive     CEFTRIAXONE <=0.12 SENSITIVE Sensitive     LEVOFLOXACIN 0.5 SENSITIVE Sensitive     PENICILLIN Value in next row Sensitive      SENSITIVE<=0.06    * STREPTOCOCCUS GROUP G  Blood Culture (routine x 2)     Status: None   Collection Time: 03/16/20 10:22 AM   Specimen: BLOOD  Result Value Ref Range Status  Specimen Description BLOOD LEFT HAND  Final   Special Requests   Final    BOTTLES DRAWN AEROBIC ONLY Blood Culture adequate volume   Culture   Final    NO GROWTH 5 DAYS Performed at [facility], [address] [place], [place] [address]    Report Status 03/21/2020 FINAL  Final  Blood Culture ID Panel (Reflexed)     Status: Abnormal   Collection Time: 03/16/20 10:22 AM  Result Value Ref Range Status   Enterococcus faecalis NOT DETECTED NOT DETECTED Final   Enterococcus Faecium NOT DETECTED NOT DETECTED Final   Listeria monocytogenes NOT DETECTED NOT DETECTED Final   Staphylococcus species NOT DETECTED NOT DETECTED Final   Staphylococcus aureus (BCID) NOT DETECTED NOT DETECTED Final   Staphylococcus epidermidis NOT DETECTED NOT DETECTED Final   Staphylococcus lugdunensis NOT DETECTED NOT DETECTED Final   Streptococcus species DETECTED (A) NOT DETECTED Final    Comment: Not Enterococcus species, Streptococcus agalactiae, Streptococcus pyogenes, or Streptococcus pneumoniae. CRITICAL RESULT CALLED TO, READ BACK BY AND VERIFIED WITH: T MAYO RN 03/17/20 0618 JDW    Streptococcus agalactiae NOT DETECTED NOT DETECTED Final   Streptococcus pneumoniae NOT DETECTED NOT DETECTED Final   Streptococcus pyogenes NOT DETECTED NOT DETECTED Final   A.calcoaceticus-baumannii NOT DETECTED NOT DETECTED Final   Bacteroides fragilis NOT DETECTED NOT DETECTED Final   Enterobacterales NOT DETECTED NOT DETECTED Final   Enterobacter cloacae complex NOT DETECTED NOT DETECTED Final   Escherichia coli NOT DETECTED NOT DETECTED Final   Klebsiella aerogenes NOT DETECTED NOT DETECTED Final    Klebsiella oxytoca NOT DETECTED NOT DETECTED Final   Klebsiella pneumoniae NOT DETECTED NOT DETECTED Final   Proteus species NOT DETECTED NOT DETECTED Final   Salmonella species NOT DETECTED NOT DETECTED Final   Serratia marcescens NOT DETECTED NOT DETECTED Final   Haemophilus influenzae NOT DETECTED NOT DETECTED Final   Neisseria meningitidis NOT DETECTED NOT DETECTED Final   Pseudomonas aeruginosa NOT DETECTED NOT DETECTED Final   Stenotrophomonas maltophilia NOT DETECTED NOT DETECTED Final   Candida albicans NOT DETECTED NOT DETECTED Final   Candida auris NOT DETECTED NOT DETECTED Final   Candida glabrata NOT DETECTED NOT DETECTED Final   Candida krusei NOT DETECTED NOT DETECTED Final   Candida parapsilosis NOT DETECTED NOT DETECTED Final   Candida tropicalis NOT DETECTED NOT DETECTED Final   Cryptococcus neoformans/gattii NOT DETECTED NOT DETECTED Final    Comment: Performed at [facility], [address] [place], [place] [address]  MRSA PCR Screening     Status: None   Collection Time: 03/16/20  8:44 PM   Specimen: Nasopharyngeal  Result Value Ref Range Status   MRSA by PCR NEGATIVE NEGATIVE Final    Comment:        The GeneXpert MRSA Assay (FDA approved for NASAL specimens only), is one component of a comprehensive MRSA colonization surveillance program. It is not intended to diagnose MRSA infection nor to guide or monitor treatment for MRSA infections. Performed at [facility], [address], [place], [place] [address]   Culture, blood (Routine X 2) w Reflex to ID Panel     Status: None   Collection Time: 03/19/20  8:44 AM   Specimen: Right Antecubital; Blood  Result Value Ref Range Status   Specimen Description   Final    RIGHT ANTECUBITAL BOTTLES DRAWN AEROBIC AND ANAEROBIC   Special Requests Blood Culture adequate volume  Final   Culture   Final    NO GROWTH  5 DAYS Performed at [facility] [place], [address] [place], [place] [address]    Report Status  03/24/2020 FINAL  Final  Culture, blood (Routine X 2) w Reflex to ID Panel     Status: None   Collection Time: 03/19/20  8:44 AM   Specimen: BLOOD RIGHT HAND  Result Value Ref Range Status   Specimen Description   Final    BLOOD RIGHT HAND BOTTLES DRAWN AEROBIC AND ANAEROBIC   Special Requests Blood Culture adequate volume  Final   Culture   Final    NO GROWTH 5 DAYS Performed at [facility], [address], [place], [place] [address]    Report Status 03/24/2020 FINAL  Final  MRSA PCR Screening     Status: None   Collection Time: 03/21/20  1:20 PM   Specimen: Nasopharyngeal  Result Value Ref Range Status   MRSA by PCR NEGATIVE NEGATIVE Final    Comment:        The GeneXpert MRSA Assay (FDA approved for NASAL specimens only), is one component of a comprehensive MRSA colonization surveillance program. It is not intended to diagnose MRSA infection nor to guide or monitor treatment for MRSA infections. Performed at [facility], [address], [place], [place] [address]   Culture, respiratory     Status: None   Collection Time: 03/21/20  3:10 PM   Specimen: Bronchoalveolar Lavage; Respiratory  Result Value Ref Range Status   Specimen Description   Final    BRONCHIAL ALVEOLAR LAVAGE Performed at [facility], [address], [place], [place] [address]    Special Requests   Final    NONE Performed at [facility], [address], [place], [place] [address]    Gram Stain NO WBC SEEN NO ORGANISMS SEEN   Final   Culture   Final    Normal respiratory flora-no Staph aureus or Pseudomonas seen Performed at [facility] [address], [place], [place] [address]    Report Status 03/24/2020 FINAL  Final    Radiology Reports DG Chest 2 View  Result Date: 03/20/2020 CLINICAL DATA:  Shortness of breath. EXAM: CHEST - 2 VIEW COMPARISON:  March 19, 2020. FINDINGS: There is stable complete opacification of left hemithorax most likely due to a combination of effusion and  atelectasis. Sternotomy wires are noted. Stable right basilar opacity is noted concerning for edema or atelectasis. No pneumothorax is noted. IMPRESSION: Stable complete opacification of left hemithorax most likely due to combination of effusion and atelectasis. Stable right basilar opacity concerning for edema or atelectasis. Electronically Signed   By: Marijo Conception M.D.   On: 03/20/2020 14:41   CT CHEST WO CONTRAST  Addendum Date: 03/23/2020   ADDENDUM REPORT: 03/23/2020 18:53 ADDENDUM: Transcription error: Impression point #4 should read: Dense mitral annular calcifications, could consider nonemergent echocardiography for evaluation of valve/cardiac function. Electronically Signed   By: Lovena Le M.D.   On: 03/23/2020 18:53   Result Date: 03/23/2020 CLINICAL DATA:  Shortness of breath, history of CHF, COPD. Thoracentesis 03/21/2019 EXAM: CT CHEST WITHOUT CONTRAST TECHNIQUE: Multidetector CT imaging of the chest was performed following the standard protocol without IV contrast. COMPARISON:  Chest CT 05/19/2018 FINDINGS: Imaging quality is severely degraded due to patient body habitus resulting in extensive photon starvation which also exacerbates the streak artifact from patient's hardware Cardiovascular: Cardiomegaly with four-chamber cardiac enlargement. Dense calcification of mitral annulus. Three-vessel coronary artery atherosclerosis. Focal pericardial thickening towards the apical septum (4/99). Atherosclerotic plaque within the  normal caliber aorta. No periaortic stranding or hemorrhage. Normal 3 vessel branching of the aortic arch. Proximal great vessels are calcified but otherwise of grossly unremarkable. Central pulmonary artery enlargement, nonspecific but similar to prior and possibly reflecting some chronic pulmonary hypertension. Luminal evaluation precluded in the absence of contrast media. Pulmonary vascularity within the lung parenchyma is cephalized and redistributed. Luminal  evaluation of the vasculature precluded in the absence of contrast. Mediastinum/Nodes: Mild edematous changes in the mediastinum. Few enlarged and borderline enlarged low-attenuation mediastinal nodes are present including a 14 mm subcarinal node (2/71). No axillary adenopathy. Hilar nodal evaluation is severely limited in the absence intravenous contrast media. Postsurgical changes in the anterior mediastinum likely related prior sternotomy and CABG. Endotracheal tube terminates low within the trachea, 1.7 cm from the carina. Consider retracting at least 2 cm to the mid trachea. Transesophageal tube is in place. Tip terminates below the level of imaging with but with the side port appropriately positioned distal to the GE junction. Thyroid gland and thoracic inlet are unremarkable. Lungs/Pleura: Moderate to large bilateral pleural effusions, right greater than left with essentially complete atelectatic collapse of both lower lobes, subsegmental collapse of the right middle lobe and lingula as well. Underlying consolidation is not excluded particularly given areas of mixed consolidation and ground-glass within the aerated portions of the upper lungs. Some mild interlobular septal thickening is noted as well. No pneumothorax. Upper Abdomen: Small amount of subdiaphragmatic ascites. Questionable stranding centered upon the pancreatic tail. Upper abdominal atherosclerosis. Musculoskeletal: Appearance of extensive bridging syndesmophytes as well as fusion across the spinous processes with some exaggerated thoracic kyphosis. Could correlate for features ankylosing spondylitis. No acute fracture or suspicious osseous lesions. Prior sternotomy as well. Lower thoracolumbar fusion hardware is noted including vertebral augmentation changes at the T11 and 12 levels. Additional degenerative changes in both shoulders. Moderate body wall edema. IMPRESSION: 1. Imaging quality is severely degraded due to patient body habitus  resulting in extensive photon starvation which also exacerbates the streak artifact from patient's hardware. 2. Moderate to large bilateral pleural effusions, right greater than left, with essentially complete atelectatic collapse of both lower lobes, subsegmental collapse of the right middle lobe and lingula as well. Underlying consolidation in the regions of volume loss is not excluded with evidence of additional areas of consolidation and ground-glass within the aerated portions of the upper lungs concerning for an acute infectious/inflammatory process including atypical viral etiologies. 3. Additional features throughout the chest suggesting CHF/volume overload with pulmonary edema, cardiomegaly, ascites and body wall edema. 4. Dense mitral annular calcifications, could consider nonemergent echocardiography for evaluation of valvulae op a 3. 5. Focal pericardial thickening towards the apical septum. Nonspecific appearance. Could correlate for features of pericarditis or ischemic change. 6. Central pulmonary artery enlargement, nonspecific and incompletely evaluated in the absence of contrast media but similar to prior and possibly reflecting some chronic pulmonary hypertension. 7. Small amount of subdiaphragmatic ascites. 8. Questionable stranding centered upon the pancreatic tail. Possibly redistributed ascites though should correlate with lipase to exclude pancreatitis. 9. Appearance of extensive bridging syndesmophytes as well as fusion across the spinous processes with some exaggerated thoracic kyphosis. Such imaging features can be seen in the setting of ankylosing spondylitis. 10. Aortic Atherosclerosis (ICD10-I70.0). Electronically Signed: By: Lovena Le M.D. On: 03/21/2020 18:16   [place] CHEST (PLEURAL EFFUSION)  Result Date: 03/20/2020 CLINICAL DATA:  Dyspnea. EXAM: CHEST ULTRASOUND - LIMITED COMPARISON:  Chest radiograph 03/20/2020 FINDINGS: Left chest sonography demonstrates a moderate left pleural  effusion.  We were unable to sit the patient up to perform thoracentesis. The patient was very stiff and even with several caregivers holding the patient, we were not able to hold him in a steady position at the site of the bed adequate to perform thoracentesis. There is too great of a risk of him falling or sliding during thoracentesis due to his rigidity in the hips and legs. IMPRESSION: 1. At least moderate left pleural effusion is visualized sonographically. Thoracentesis was not attempted as the patient could not be positioned adequately. Electronically Signed   By: Van Clines M.D.   On: 03/20/2020 15:00   DG CHEST PORT 1 VIEW  Result Date: 03/23/2020 CLINICAL DATA:  OG tube placement EXAM: PORTABLE CHEST 1 VIEW COMPARISON:  Radiograph 03/23/2020 FINDINGS: Endotracheal tube tip in the mid to lower trachea, 2.7 cm from the carina. Transesophageal tube tip terminates below the level of imaging with the side port beyond the GE junction. Telemetry leads overlie the chest. Postsurgical changes from prior sternotomy. Persistent layering bilateral effusions with hazy opacities in the lung bases likely reflecting combination of airspace disease, atelectasis and superimposed edema. IMPRESSION: 1. Endotracheal tube tip in the mid to lower trachea, 2.7 cm from the carina. 2. Transesophageal tube tip terminates below the level of imaging with the side port beyond the GE junction. 3. Persistent layering effusions. 4. Residual airspace opacities could reflect edema, atelectasis or airspace disease or combination there of. Electronically Signed   By: Lovena Le M.D.   On: 03/23/2020 18:54   DG Chest Port 1 View  Result Date: 03/23/2020 CLINICAL DATA:  Respiratory failure. EXAM: PORTABLE CHEST 1 VIEW COMPARISON:  03/21/2020 FINDINGS: The endotracheal tube and NG tubes are stable. Stable mild cardiac enlargement. Significant interval improved aeration of the left lung probable resolution of mucous plugging.  Persistent left basilar atelectasis and small bilateral effusions. IMPRESSION: Stable support apparatus. Significant interval improved aeration of the left lung with probable resolution of mucous plugging. Electronically Signed   By: Marijo Sanes M.D.   On: 03/23/2020 07:21   DG CHEST PORT 1 VIEW  Result Date: 03/21/2020 CLINICAL DATA:  Hypoxia EXAM: PORTABLE CHEST 1 VIEW COMPARISON:  March 21, 2020 study obtained earlier in the day FINDINGS: Endotracheal tube tip is 4.5 cm above the carina. Nasogastric tube tip is in the proximal stomach with the side port near the gastroesophageal junction, not well seen. No pneumothorax. There is extensive pleural effusion on the left with essentially complete opacification of the left hemithorax. There may well be underlying atelectasis and infiltrate on the left. On the right, there is apparent loculated effusion in the lateral right base. Right lung otherwise clear. Heart is prominent, stable, with evidence of previous median sternotomy. There is aortic atherosclerosis. Pulmonary vascularity on the right appears unremarkable. Pulmonary vascularity on the left is obscured fluid. No adenopathy evident in areas that can be assessed for potential adenopathy. Postoperative change noted in the lower thoracic and lumbar regions. IMPRESSION: Tube and catheter positions as described without pneumothorax. It may be prudent to consider advancing endotracheal tube 4-5 cm to insure that both tube tip and side port are well within the stomach. Extensive opacification on the left with essentially complete opacification of the left hemithorax, likely due to combination of fluid and atelectasis. Superimposed pneumonia on the left cannot be excluded. Probable loculated pleural effusion lateral right base. Right lung otherwise clear. Stable cardiac silhouette. Aortic Atherosclerosis (ICD10-I70.0). Electronically Signed   By: Lowella Grip III M.D.  On: 03/21/2020 15:23   DG CHEST  PORT 1 VIEW  Result Date: 03/21/2020 CLINICAL DATA:  Shortness of breath, history CHF, COPD, atrial fibrillation, obesity, was COVID-19 negative on 03/16/2020, post thoracentesis 03/20/2020 EXAM: PORTABLE CHEST 1 VIEW COMPARISON:  Portable exam 1342 hours compared to 03/20/2020 FINDINGS: Rotated to the LEFT. Upper normal size of cardiac silhouette post median sternotomy. Pulmonary vascular congestion. Bibasilar effusions and atelectasis, significantly decreased on LEFT since previous exam. Minimal pulmonary edema question. No pneumothorax. Bones demineralized with prior thoracolumbar fusion. IMPRESSION: Bibasilar pleural effusions and atelectasis, decreased on LEFT since prior exam. Question minimal pulmonary edema. Electronically Signed   By: Lavonia Dana M.D.   On: 03/21/2020 14:02   DG CHEST PORT 1 VIEW  Result Date: 03/19/2020 CLINICAL DATA:  Worsening dyspnea today, COPD, diabetes mellitus, hypertension, negative COVID-19 test on 03/16/2020 EXAM: PORTABLE CHEST 1 VIEW COMPARISON:  Portable exam 1603 hours compared to 03/16/2020 FINDINGS: Opacification of LEFT hemithorax by pleural effusion and atelectasis increased from prior study. LEFT heart border obscured. Patchy infiltrates mid to lower RIGHT lung with minimal basilar atelectasis and probable small pleural effusion. No pneumothorax. Bones demineralized. IMPRESSION: Patchy RIGHT lung infiltrates with basilar pleural effusion and atelectasis. Opacified LEFT hemithorax by combination of atelectasis and pleural effusion, underlying infiltrate not excluded. Electronically Signed   By: Lavonia Dana M.D.   On: 03/19/2020 16:21   DG Chest Port 1 View  Result Date: 03/16/2020 CLINICAL DATA:  Concern for sepsis. EXAM: PORTABLE CHEST 1 VIEW COMPARISON:  06/17/2019 chest radiograph and prior. FINDINGS: Left basilar/retrocardiac patchy opacities. No pneumothorax. Small left pleural effusion. Cardiomegaly and prominence of the central pulmonary vessels. Post  sternotomy sequela. IMPRESSION: Cardiomegaly and central pulmonary vascular congestion. Left basilar opacities. Differential includes infection, edema and atelectasis. Electronically Signed   By: Primitivo Gauze M.D.   On: 03/16/2020 08:14   ECHOCARDIOGRAM COMPLETE  Result Date: 03/18/2020    ECHOCARDIOGRAM REPORT   Patient Name:   JOVANTE HAMMITT Date of Exam: 03/18/2020 Medical Rec #:  [id number]    Height:       69.0 in Accession #:    [id number]   Weight:       240.0 lb Date of Birth:  [birth year]    BSA:          2.232 m Patient Age:    55 years     BP:           110/68 mmHg Patient Gender: M            HR:           67 bpm. Exam Location:  [facility] Procedure: 2D Echo and Intracardiac Opacification Agent Indications:    Bacteremia R78.81  History:        Patient has prior history of Echocardiogram examinations, most                 recent 05/20/2019. CHF, COPD, Arrythmias:Atrial Fibrillation;                 Risk Factors:Hypertension and Diabetes.                  Mitral Valve: 29 mm St. Jude tissue valve valve is present in                 the mitral position. Procedure Date: 09/07/2012.  Sonographer:    Mikki Santee RDCS (AE) Referring Phys: [id number] COURAGE EMOKPAE IMPRESSIONS  1. Left ventricular ejection fraction, by estimation, is  35 to 40%. The left ventricle has moderately decreased function. The left ventricle demonstrates global hypokinesis. There is mild concentric left ventricular hypertrophy. Left ventricular diastolic parameters are indeterminate.  2. Right ventricular systolic function is normal. The right ventricular size is normal. There is moderately elevated pulmonary artery systolic pressure. The estimated right ventricular systolic pressure is 03.5 mmHg.  3. Left atrial size was severely dilated.  4. The mitral valve has been repaired/replaced. No evidence of mitral valve regurgitation. Moderate to severe mitral stenosis. The mean mitral valve gradient is 11.7 mmHg at 89bpm. There is a  29 mm St. Jude tissue valve present in the mitral position. Procedure Date: 09/07/2012. Echo findings are consistent with normal structure and function of the mitral valve prosthesis.  5. The aortic valve is tricuspid. Aortic valve regurgitation is not visualized. Mild aortic valve sclerosis is present, with no evidence of aortic valve stenosis.  6. The inferior vena cava is dilated in size with <50% respiratory variability, suggesting right atrial pressure of 15 mmHg.  7. Compared to prior study 05/2018, the mean MV gradient of the MV tissue valve has increased from 8 to 5mHg. FINDINGS  Left Ventricle: Left ventricular ejection fraction, by estimation, is 35 to 40%. The left ventricle has moderately decreased function. The left ventricle demonstrates global hypokinesis. Definity contrast agent was given IV to delineate the left ventricular endocardial borders. The left ventricular internal cavity size was normal in size. There is mild concentric left ventricular hypertrophy. Left ventricular diastolic parameters are indeterminate. Right Ventricle: The right ventricular size is normal. No increase in right ventricular wall thickness. Right ventricular systolic function is normal. There is moderately elevated pulmonary artery systolic pressure. The tricuspid regurgitant velocity is 3.16 m/s, and with an assumed right atrial pressure of 15 mmHg, the estimated right ventricular systolic pressure is 546.5mmHg. Left Atrium: Left atrial size was severely dilated. Right Atrium: Right atrial size was normal in size. Pericardium: There is no evidence of pericardial effusion. Mitral Valve: The mitral valve has been repaired/replaced. No evidence of mitral valve regurgitation. There is a 29 mm St. Jude tissue valve present in the mitral position. Procedure Date: 09/07/2012. Echo findings are consistent with normal structure and function of the mitral valve prosthesis. Moderate to severe mitral valve stenosis. MV peak gradient,  28.7 mmHg. The mean mitral valve gradient is 11.7 mmHg. Tricuspid Valve: The tricuspid valve is normal in structure. Tricuspid valve regurgitation is mild . No evidence of tricuspid stenosis. Aortic Valve: The aortic valve is tricuspid. Aortic valve regurgitation is not visualized. Mild aortic valve sclerosis is present, with no evidence of aortic valve stenosis. Pulmonic Valve: The pulmonic valve was normal in structure. Pulmonic valve regurgitation is trivial. No evidence of pulmonic stenosis. Aorta: The aortic root is normal in size and structure. Venous: The inferior vena cava is dilated in size with less than 50% respiratory variability, suggesting right atrial pressure of 15 mmHg. IAS/Shunts: No atrial level shunt detected by color flow Doppler.  LEFT VENTRICLE PLAX 2D LVIDd:         5.42 cm LVIDs:         4.46 cm LV PW:         1.22 cm LV IVS:        1.30 cm LVOT diam:     2.50 cm LV SV:         59 LV SV Index:   26 LVOT Area:     4.91 cm  RIGHT VENTRICLE TAPSE (  M-mode): 1.0 cm LEFT ATRIUM              Index       RIGHT ATRIUM           Index LA diam:        5.10 cm  2.28 cm/m  RA Area:     20.30 cm LA Vol (A2C):   121.0 ml 54.20 ml/m RA Volume:   59.20 ml  26.52 ml/m LA Vol (A4C):   114.0 ml 51.06 ml/m LA Biplane Vol: 123.0 ml 55.10 ml/m  AORTIC VALVE LVOT Vmax:   72.10 cm/s LVOT Vmean:  48.067 cm/s LVOT VTI:    0.120 m  AORTA Ao Root diam: 3.60 cm MITRAL VALVE              TRICUSPID VALVE MV Area (PHT): 2.16 cm   TR Peak grad:   39.9 mmHg MV Peak grad:  28.7 mmHg  TR Vmax:        316.00 cm/s MV Mean grad:  11.7 mmHg MV Vmax:       2.68 m/s   SHUNTS MV Vmean:      159.3 cm/s Systemic VTI:  0.12 m                           Systemic Diam: 2.50 cm Fransico Him MD Electronically signed by Fransico Him MD Signature Date/Time: 03/18/2020/4:27:42 PM    Final      CBC Recent Labs  Lab 03/19/20 0840 03/21/20 0554 03/22/20 0332 03/23/20 0307  WBC 6.0 5.9 6.2 7.3  HGB 11.3* 11.8* 10.7* 10.5*  HCT  38.0* 40.4 36.1* 34.8*  PLT 108* 146* 139* 155  MCV 97.2 98.3 96.3 94.3  MCH 28.9 28.7 28.5 28.5  MCHC 29.7* 29.2* 29.6* 30.2  RDW 16.5* 16.1* 16.1* 16.4*    Chemistries  Recent Labs  Lab 03/19/20 0840 03/21/20 0554 03/21/20 1521 03/21/20 1840 03/22/20 0332 03/22/20 1647 03/23/20 0307 03/24/20 0536  NA 136 144  --   --  145  --  145 145  K 4.4 5.0  --   --  4.2  --  3.6 3.2*  CL 100 106  --   --  108  --  105 104  CO2 29 32  --   --  30  --  32 34*  GLUCOSE 156* 145*  --   --  121*  --  182* 150*  BUN 30* 31*  --   --  35*  --  35* 31*  CREATININE 0.72 0.62  --   --  0.62  --  0.59* 0.55*  CALCIUM 8.3* 9.0  --   --  8.5*  --  8.1* 7.8*  MG  --   --  2.3 2.3 2.3 2.0  --   --   AST  --  18  --   --  26  --   --   --   ALT  --  17  --   --  21  --   --   --   ALKPHOS  --  73  --   --  73  --   --   --   BILITOT  --  0.8  --   --  0.6  --   --   --    ------------------------------------------------------------------------------------------------------------------ Recent Labs    03/22/20 0332  TRIG 92    Lab Results  Component Value Date  HGBA1C 5.6 03/01/2020   Coagulation profile No results for input(s): INR, PROTIME in the last 168 hours.  ------------------------------------------------------------------------------------------------------------------    Component Value Date/Time   BNP 368.9 (H) 06/03/2019 2149   Time spent: 40 minutes.  Barton Dubois MD on 03/24/2020 at 7:20 PM  Go to [email or web address] - for contact info  [facility] - Office  [phone]

## 2020-03-24 NOTE — Progress Notes (Signed)
NAMEBalian Hale, MRN:  [id number], DOB:  [birth year], LOS: 8 ADMISSION DATE:  03/16/2020, CONSULTATION DATE:  03/21/2020 REFERRING MD:  Dr. Barth Kirks, CHIEF COMPLAINT:  Hypoxia   Brief History   69 yo male former smoker brought to ER from [facility] with fever 105F and wound on Rt lower leg and sacrum.  Found to have pneumonia, sepsis, and bacteremia.  Developed progressive hypoxia with respiratory distress from aspiration and mucus plugging.  Transferred to ICU and required intubation.  Past Medical History  OSA, PSVT, Nephrolithiasis, Depression, LBBB, Bioprosthetic mitral valve, Gastroparesis, HTN, Endocarditis 2014, DM type 2, DJD, CAD, COPD, A fib, systolic CHF  Significant Hospital Events   9/09 Admit 9/14 transfer to ICU, intubated 9/17 extubate  Consults:    Procedures:  ETT 9/14 >> 9/17  Significant Diagnostic Tests:   Echo 03/18/20 >> EF 35 to 40%, RVSP 54.9 mmHg, s/p MVR  Bronchoscopy 03/21/20 >> mucus plug Lt main bronchus  CT chest 9/14//21 >> b/l consolidation and ATX, b/l effusions  Micro Data:  COVID 9/09 >> negative MRSA PCR 9/09 >> negative Blood 9/09 >> Group G Streptococcus Blood 9/12 >> negative BAL 9/14 >> oral flora  Antimicrobials:  Zithromax 9/09 >> 9/13 Rocephin 9/09 >>   Interim history/subjective:  On pressure support.  Objective   Blood pressure (!) 149/64, pulse 74, temperature 98.8 F (37.1 C), temperature source Axillary, resp. rate 15, height _0  (1.753 m), weight 115.7 kg, SpO2 97 %.    Vent Mode: CPAP FiO2 (%):  [40 %] 40 % Set Rate:  [16 bmp] 16 bmp Vt Set:  [560 mL] 560 mL PEEP:  [5 cmH20] 5 cmH20 Pressure Support:  [5 cmH20-10 cmH20] 10 cmH20 Plateau Pressure:  [18 cmH20-23 cmH20] 18 cmH20   Intake/Output Summary (Last 24 hours) at 03/24/2020 1305 Last data filed at 03/24/2020 0451 Gross per 24 hour  Intake 576.12 ml  Output 3200 ml  Net -2623.88 ml   Filed Weights   03/21/20 1410  Weight: 115.7 kg    Examination:   General - alert Eyes - pupils reactive ENT - ETT in place Cardiac - regular rate/rhythm, no murmur Chest - equal breath sounds b/l, no wheezing or rales Abdomen - soft, non tender, + bowel sounds Extremities - no cyanosis, clubbing, or edema Skin - no rashes Neuro - RASS 0, follows commands   Resolved Hospital Problem list     Assessment & Plan:   Acute hypoxic respiratory failure from aspiration pneumonitis with mucus plugging and b/l pleural effusions. Hx of COPD. - extubation trial 9/17 - lasix 40 mg IV x one on 9/17 - f/u CXR intermittently - goal SpO2 > 90% - scheduled BDs  Group G Streptococcal bacteremia. - ABx per primary team  S/p Mitral valve replacement with concern for endocarditis. Hx of A fib, HTN, acute on chronic systolic CHF, CAD. - continue lasix - continue crestor, crestor, xarelto  DM type II poorly controlled with hyperglycemia. - SSI  Pressure ulcers. - sacrum and Rt foot, both present prior to admission - wound care  Acute metabolic encephalopathy from hypoxia, sepsis. - monitor mental status after extubation  Dysphagia. - speech therapy to assess swallowing  Deconditioning. - PT/OT  Best practice:  Diet: NPO DVT prophylaxis: xarelto GI prophylaxis: protonix Mobility: bed rest Code Status: full code Disposition: ICU  Labs    CMP Latest Ref Rng & Units 03/24/2020 03/23/2020 03/22/2020  Glucose 70 - 99 mg/dL 150(H) 182(H) 121(H)  BUN 8 -  23 mg/dL 31(H) 35(H) 35(H)  Creatinine 0.61 - 1.24 mg/dL 0.55(L) 0.59(L) 0.62  Sodium 135 - 145 mmol/L 145 145 145  Potassium 3.5 - 5.1 mmol/L 3.2(L) 3.6 4.2  Chloride 98 - 111 mmol/L 104 105 108  CO2 22 - 32 mmol/L 34(H) 32 30  Calcium 8.9 - 10.3 mg/dL 7.8(L) 8.1(L) 8.5(L)  Total Protein 6.5 - 8.1 g/dL - - 5.1(L)  Total Bilirubin 0.3 - 1.2 mg/dL - - 0.6  Alkaline Phos 38 - 126 U/L - - 73  AST 15 - 41 U/L - - 26  ALT 0 - 44 U/L - - 21    CBC Latest Ref Rng & Units 03/23/2020 03/22/2020  03/21/2020  WBC 4.0 - 10.5 K/uL 7.3 6.2 5.9  Hemoglobin 13.0 - 17.0 g/dL 10.5(L) 10.7(L) 11.8(L)  Hematocrit 39 - 52 % 34.8(L) 36.1(L) 40.4  Platelets 150 - 400 K/uL 155 139(L) 146(L)    ABG    Component Value Date/Time   PHART 7.385 03/21/2020 1730   PCO2ART 53.6 (H) 03/21/2020 1730   PO2ART 103 03/21/2020 1730   HCO3 29.5 (H) 03/21/2020 1730   TCO2 30 05/27/2019 0902   ACIDBASEDEF 1.7 03/02/2018 2230   O2SAT 97.7 03/21/2020 1730    CBG (last 3)  Recent Labs    03/24/20 0304 03/24/20 0741 03/24/20 1146  GLUCAP 127* 153* 148*    Critical care time: 33 minutes  Chesley Mires, MD [place] Pager - [phone] 03/24/2020, 1:05 PM

## 2020-03-24 NOTE — Plan of Care (Signed)
  Problem: Acute Rehab PT Goals(only PT should resolve) Goal: Pt will Roll Supine to Side Outcome: Progressing Flowsheets (Taken 03/24/2020 1423) Pt will Roll Supine to Side: with min assist Goal: Pt Will Go Supine/Side To Sit Outcome: Progressing Flowsheets (Taken 03/24/2020 1423) Pt will go Supine/Side to Sit: with moderate assist Goal: Pt Will Go Sit To Supine/Side Outcome: Progressing Flowsheets (Taken 03/24/2020 1423) Pt will go Sit to Supine/Side: with moderate assist Goal: Patient Will Perform Sitting Balance Outcome: Progressing Flowsheets (Taken 03/24/2020 1423) Patient will perform sitting balance:  with moderate assist  with minimal assist  2:24 PM, 03/24/20 Wyman Songster PT, DPT Physical Therapist at [facility]

## 2020-03-24 NOTE — Progress Notes (Signed)
AT 1320hr, RT extubated patient to 4LO2 via nasal cannula, SATs 997%,HR 73 and BP 149/43. NO compications noted andpatient tolerated well. RN and MD at bedside. RT will continue to monitor and assess.

## 2020-03-24 NOTE — Progress Notes (Signed)
Dressing to right foot and sacrum changed as ordered. Continue to assess and monitor.
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
## 2020-03-24 NOTE — Evaluation (Signed)
Physical Therapy Evaluation Patient Details Name: Cody Hale MRN: [id number] DOB: 07/30/1950 Today's Date: 03/24/2020   History of Present Illness  Cody Hale  is a 69 y.o. 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 Patient fatigued/ lethargic secondary to being extubated earlier. Patient showing generalized weakness with limited AROM in LE secondary to weakness and fatigue. Patient attempt to pull to long sitting in bed with max assist but patient unable to pull to full sitting position due to weakness. Patient attempts to roll to right with great difficulty and requires max assist for rolling. Patient will benefit from continued physical therapy in hospital and recommended venue below to increase strength, balance, endurance for safe ADLs and gait.     Follow Up Recommendations SNF    Equipment Recommendations  None recommended by PT    Recommendations for Other Services       Precautions / Restrictions Precautions Precautions: Fall Precaution Comments: sacral and R LE wounds Restrictions Weight Bearing Restrictions: No      Mobility  Bed Mobility Overal bed mobility: Needs Assistance Bed Mobility: Rolling;Supine to Sit Rolling: Max assist   Supine to sit: Max assist;HOB elevated     General bed mobility comments: attempt to pull to seated in bed but  patient was unable to due to fatigue; attempt to roll to R requiring max assist  Transfers                    Ambulation/Gait                Stairs            Wheelchair Mobility    Modified Rankin (Stroke Patients Only)       Balance                                             Pertinent Vitals/Pain Pain Assessment: No/denies pain    Home Living Family/patient expects to be discharged to:: Skilled nursing facility                      Prior Function Level of Independence: Needs assistance   Gait / Transfers Assistance Needed: Patient states wheelchair bound for last month due to weakness; was ambulating with RW  ADL's / Homemaking Assistance Needed: Requires assist        Hand Dominance        Extremity/Trunk Assessment   Upper Extremity Assessment Upper Extremity Assessment: Defer to OT evaluation    Lower Extremity Assessment Lower Extremity Assessment: Generalized weakness       Communication   Communication: No difficulties  Cognition Arousal/Alertness: Awake/alert Behavior During Therapy: WFL for tasks assessed/performed Overall Cognitive Status: Within Functional Limits for tasks assessed  General Comments      Exercises     Assessment/Plan    PT Assessment Patient needs continued PT services  PT Problem List Decreased strength;Decreased mobility;Decreased activity tolerance;Decreased balance;Cardiopulmonary status limiting activity       PT Treatment Interventions DME instruction;Therapeutic exercise;Gait training;Balance training;Stair training;Neuromuscular re-education;Therapeutic activities;Patient/family education;Functional mobility training    PT Goals (Current goals can be found in the Care Plan section)  Acute Rehab PT Goals Patient Stated Goal: Return home PT Goal Formulation: With patient Time For Goal Achievement:  04/07/20 Potential to Achieve Goals: Fair    Frequency Min 3X/week   Barriers to discharge        Co-evaluation               AM-PAC PT "6 Clicks" Mobility  Outcome Measure Help needed turning from your back to your side while in a flat bed without using bedrails?: A Lot Help needed moving from lying on your back to sitting on the side of a flat bed without using bedrails?: A Lot Help needed moving to and from a bed to a chair (including a wheelchair)?: Total Help needed standing up from a chair using your arms (e.g., wheelchair or bedside chair)?: Total Help needed to walk in hospital room?: Total Help needed climbing 3-5 steps with a railing? : Total 6 Click Score: 8    End of Session Equipment Utilized During Treatment: Oxygen Activity Tolerance: Patient limited by fatigue;Patient limited by lethargy Patient left: in bed;with call bell/phone within reach Nurse Communication: Mobility status PT Visit Diagnosis: Unsteadiness on feet (R26.81);Other abnormalities of gait and mobility (R26.89);Muscle weakness (generalized) (M62.81)    Time: 3845-3646 PT Time Calculation (min) (ACUTE ONLY): 9 min   Charges:   PT Evaluation $PT Eval Moderate Complexity: 1 Mod          2:22 PM, 03/24/20 Wyman Songster PT, DPT Physical Therapist at [facility]

## 2020-03-25 LAB — GLUCOSE, CAPILLARY
Glucose-Capillary: 107 mg/dL — ABNORMAL HIGH (ref 70–99)
Glucose-Capillary: 117 mg/dL — ABNORMAL HIGH (ref 70–99)
Glucose-Capillary: 114 mg/dL — ABNORMAL HIGH (ref 70–99)
Glucose-Capillary: 114 mg/dL — ABNORMAL HIGH (ref 70–99)
Glucose-Capillary: 108 mg/dL — ABNORMAL HIGH (ref 70–99)

## 2020-03-25 LAB — BASIC METABOLIC PANEL
Anion gap: 6 (ref 5–15)
BUN: 21 mg/dL (ref 8–23)
CO2: 35 mmol/L — ABNORMAL HIGH (ref 22–32)
Calcium: 8.1 mg/dL — ABNORMAL LOW (ref 8.9–10.3)
Chloride: 105 mmol/L (ref 98–111)
Creatinine, Ser: 0.47 mg/dL — ABNORMAL LOW (ref 0.61–1.24)
GFR calc Af Amer: >60 mL/min (ref >60)
GFR calc non Af Amer: >60 mL/min (ref >60)
Glucose, Bld: 123 mg/dL — ABNORMAL HIGH (ref 70–99)
Potassium: 3.6 mmol/L (ref 3.5–5.1)
Sodium: 146 mmol/L — ABNORMAL HIGH (ref 135–145)

## 2020-03-25 LAB — CBC
HCT: 35.6 % — ABNORMAL LOW (ref 39.0–52.0)
Hemoglobin: 10.4 g/dL — ABNORMAL LOW (ref 13.0–17.0)
MCH: 28.6 pg (ref 26.0–34.0)
MCHC: 29.2 g/dL — ABNORMAL LOW (ref 30.0–36.0)
MCV: 97.8 fL (ref 80.0–100.0)
Platelets: 159 10*3/uL (ref 150–400)
RBC: 3.64 MIL/uL — ABNORMAL LOW (ref 4.22–5.81)
RDW: 16.4 % — ABNORMAL HIGH (ref 11.5–15.5)
WBC: 4.4 10*3/uL (ref 4.0–10.5)
nRBC: 0 % (ref 0.0–0.2)

## 2020-03-25 MED ORDER — GUAIFENESIN ER 600 MG PO TB12
600.0000 mg | ORAL_TABLET | Freq: Two times a day (BID) | ORAL | Status: DC
  Administered 2020-03-25 – 2020-03-30 (×4): 600 mg via ORAL
  Filled 2020-03-25 (×10): qty 1

## 2020-03-25 NOTE — Progress Notes (Signed)
Patient Demographics:    Cody Hale, is a 69 y.o. male, DOB - 07-28-1950, UUE:[id number]  Admit date - 03/16/2020   Admitting Physician Courage Denton Brick, MD  Outpatient Primary MD for the patient is [name], MD  LOS - 9  Chief Complaint  Patient presents with  . Fever        Subjective:    Cody Hale patient successfully extubated on 03/24/20; still with some difficulty speaking in full sentences, requiring 4L [place] supplementation and with increased productive coughing spells.   Assessment  & Plan :    Principal Problem:   Sepsis due to pneumonia Active Problems:   OSA (obstructive sleep apnea)   Essential hypertension, malignant   Type II diabetes mellitus with manifestations (HCC)   Chronic systolic CHF (congestive heart failure) (HCC)   Goals of care, counseling/discussion   COPD (chronic obstructive pulmonary disease) with chronic bronchitis (HCC)   Chronic atrial fibrillation   S/P mitral valve replacement with bioprosthetic valve   Major depressive disorder, recurrent severe without psychotic features ([place])   Chronic respiratory failure with hypoxia ([place])   Community acquired pneumonia   Streptococcus G Bacteremia   Endotracheally intubated   Palliative care by specialist  Brief Summary:- 69 y.o. 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, chronic atrial fibrillation with chronic anticoagulation, COPD, diabetes mellitus , h/o T11/L4 fusion after lumbar fracture 11/17/20withWound dehiscence noted12/7/20, foot wound Prior history of dysphagia requiring PEG tube placement 06/04/2019 chronic hypoxic respiratory failure admitted on 03/17/2019 from [facility] ALF with acute on chronic hypoxic respiratory failure secondary to community-acquired pneumonia -Prior H/o Bacterial  Endocarditis --s/p Mitral Valve Repair Previously Now has Strep group G Bacteremia---Needs TEE  A/p 1)Community-acquired Pneumonia---  --In the ED chest x-ray was suggestive of pulmonary venous congestion Versus pneumonia -However given fevers, leukocytosis cough and dyspnea and hypoxia, favor pneumonia -On IV Rocephin and azithromycin initially; has now completed zithromax therapy and remains on rocephin as per ID rec's. -Repeat chest x-ray yesterday showed stable complete opacification of the left hemithorax likely combination of effusion and atelectasis. -aspiration and bilateral pleural effusion also playing a role in his difficulty breathing. -Successfully extubated 2020-03-24; using 4 L nasal cannula supplementation at this time.  2)Strep Group G Sepsis --patient had leukocytosis, tachypnea, tachycardia and fevers --- as well as confusion and hypoxia -- suspect secondary to. -UA not suggestive of UTI -Patient also has right foot wound and sacral wounds do not look particularly infected--please see photos in epic --Lactic acid is not elevated -Continue Rocephin blood cultures from 03/16/20 with strep group G,--patient with prior history of endocarditis repeat TTE from 03/18/2020 noted  repeat blood cultures on 03/19/2020 no growth so far. -WBC within normal limits and no longer febrile. -Prior H/o Bacterial Endocarditis --s/p Mitral Valve Repair Previously Now has Strep group G Bacteremia- -If TEE is negative patient may be discharged home on p.o. Keflex to complete 2 weeks of antibiotic therapy for bacteremia (2 weeks from first negative culture) --Phone consult with Dr. Bobby Rumpf from infectious disease -TEE still pending at this time.  3) chronic anemia----hemoglobin currently above 11 which is higher than recent baseline, -No bleeding concerns  at this time -Continue to follow hemoglobin trend.  4) chronic atrial fibrillation    -on anticoagulation with Xarelto -Rate  controlled overall.  5)Bioprosthetic Mitral Valve Replacement after Endocarditis in 2014--repeat echo from 03/18/20 showed---mitral valve has been repaired/replaced. No evidence of mitral valve regurgitation. Moderate to severe mitral stenosis. The mean mitral valve gradient is 11.7 mmHg at 89bpm. There is a 29 mm St. Jude tissue valve present in the mitral position.  --Cardiology consult for possible TEE requested due to strep group G bacteremia -Unable to do TEE secondary to his respiratory status. -Continue current IV antibiotics.  6)DM2-  -Continue sliding scale insulin -Follow CBGs and adjust hypoglycemic regimen as required.   7) decubitus ulcers--mostly on the buttocks and right foot----wound care consult appreciated, -Continue to follow wound care recommendations and preventive measures -pressure injuries were present on admission.   8) acute on chronic hypoxic respiratory failure--- secondary to #1 above, with aspiration pneumonitis and concern for mucous plugging  -Currently intubated and mechanically ventilated -Successfully extubated 2020-03-24 -Will continue nasal cannula supplementation and nightly CPAP. -Continue scheduled bronchodilators, continue IV Lasix to further assist drying lungs -Continue to follow recommendations by pulmonology service.   -continue mucolytics   9-BPH -continue flomax  Status is: Inpatient   Dispo: The patient is from: ALF  Anticipated d/c is to:  To be determined; but possible will require skilled nursing facility at discharge.  Anticipated d/c date is: Unknown at this time.  Patient currently is not medically stable to d/c. Barriers: Not Clinically Stable-pneumonia/sepsis strep group G bacteremia requiring IV antibiotics.  Also having acute on chronic hypoxemic respiratory failure, requiring oxygen supplementation.  He was extubated on 2020-03-24. Continue current IV antibiotics, follow clinical  response and recommendations by pulmonologist.  Code Status : full  Family Communication: daughter was updated by palliative care service   Consults  :  Phone consult with ID and Cardiologist DVT Prophylaxis  :  Xarelto- SCDs   Lab Results  Component Value Date   PLT 159 03/25/2020    Inpatient Medications  Scheduled Meds: . ascorbic acid  500 mg Per Tube Daily  . carvedilol  3.125 mg Per Tube BID WC  . Chlorhexidine Gluconate Cloth  6 each Topical Daily  . docusate  100 mg Oral BID  . feeding supplement (PROSource TF)  45 mL Per Tube 6 X Daily  . ferrous sulfate  300 mg Per Tube BID WC  . Gerhardt's butt cream   Topical QID  . [START ON 03/27/2020] influenza vaccine adjuvanted  0.5 mL Intramuscular Tomorrow-1000  . insulin aspart  0-20 Units Subcutaneous Q4H  . ipratropium-albuterol  3 mL Nebulization Q6H  . metoCLOPramide  5 mg Per Tube Q8H  . multivitamin with minerals  1 tablet Per Tube Daily  . pantoprazole sodium  40 mg Per Tube Q24H  . polyethylene glycol  17 g Oral Daily  . rivaroxaban  20 mg Per Tube Q supper  . rosuvastatin  5 mg Per Tube Daily  . sodium chloride flush  3 mL Intravenous Q12H  . tamsulosin  0.4 mg Oral QPC breakfast  . Vilazodone HCl  40 mg Oral Daily   Continuous Infusions: . sodium chloride    . cefTRIAXone (ROCEPHIN)  IV 2 g (03/25/20 0831)   PRN Meds:.sodium chloride, acetaminophen, albuterol, [DISCONTINUED] ondansetron **OR** ondansetron (ZOFRAN) IV, polyethylene glycol, sodium chloride flush    Anti-infectives (From admission, onward)   Start     Dose/Rate Route Frequency Ordered Stop  03/17/20 0830  cefTRIAXone (ROCEPHIN) 2 g in sodium chloride 0.9 % 100 mL IVPB        2 g 200 mL/hr over 30 Minutes Intravenous Every 24 hours 03/17/20 0813     03/16/20 1945  cefTRIAXone (ROCEPHIN) 1 g in sodium chloride 0.9 % 100 mL IVPB  Status:  Discontinued        1 g 200 mL/hr over 30 Minutes Intravenous Every 24 hours 03/16/20 1933 03/17/20 0813    03/16/20 1945  azithromycin (ZITHROMAX) 500 mg in sodium chloride 0.9 % 250 mL IVPB  Status:  Discontinued        500 mg 250 mL/hr over 60 Minutes Intravenous Every 24 hours 03/16/20 1933 03/21/20 1206   03/16/20 1115  cefTRIAXone (ROCEPHIN) 1 g in sodium chloride 0.9 % 100 mL IVPB        1 g 200 mL/hr over 30 Minutes Intravenous  Once 03/16/20 1103 03/16/20 1210   03/16/20 1115  azithromycin (ZITHROMAX) 500 mg in sodium chloride 0.9 % 250 mL IVPB        500 mg 250 mL/hr over 60 Minutes Intravenous  Once 03/16/20 1103 03/16/20 1326        Objective:   Vitals:   03/25/20 0800 03/25/20 0900 03/25/20 0954 03/25/20 1000  BP: (!) 129/38 (!) 132/51  (!) 114/51  Pulse:      Resp: _0 (!) 21  Temp:      TempSrc:      SpO2: 93% 94% 95% 95%  Height:        Wt Readings from Last 3 Encounters:  03/01/20 110.7 kg  02/17/20 102.1 kg  02/10/20 108.9 kg     Intake/Output Summary (Last 24 hours) at 03/25/2020 1038 Last data filed at 03/25/2020 0757 Gross per 24 hour  Intake 322.85 ml  Output 2700 ml  Net -2377.15 ml    Physical Exam General exam: Alert, awake, oriented x 3, extubated and using 4L [place] supplementation, rhodochrous on exam, having difficulties speaking in full sentences and SOB with minimal activity.  Chronically ill in appearance and feeling weak/deconditioned. Respiratory system: Positive rhonchi, no wheezing, no using accessory muscle. Cardiovascular system: Soft murmur, no rubs, no gallops, irregular rhythm.  Rate controlled.  Trace to 1+ edema appreciated bilaterally. Gastrointestinal system: Abdomen is nondistended, soft and nontender. No organomegaly or masses felt. Normal bowel sounds heard. Central nervous system: Alert and oriented. No focal neurological deficits. Extremities: No cyanosis or clubbing. Skin: No rashes, no petechiae.  Unchanged pressure injuries especially affecting right foot decubitus area; no signs of superimposed infection. Psychiatry:  Judgement and insight appear normal. Mood & affect appropriate.     Data Review:   Micro Results Recent Results (from the past 240 hour(s))  Urine culture     Status: Abnormal   Collection Time: 03/16/20  7:39 AM   Specimen: Urine, Catheterized  Result Value Ref Range Status   Specimen Description   Final    URINE, CATHETERIZED Performed at [facility], [address], [place], [place] [address]    Special Requests   Final    NONE Performed at [facility], [address], [place],  [address]    Culture MULTIPLE SPECIES PRESENT, SUGGEST RECOLLECTION (A)  Final   Report Status 03/17/2020 FINAL  Final  SARS Coronavirus 2 by RT PCR (hospital order, performed in [place] hospital lab) Nasopharyngeal Nasopharyngeal Swab     Status: None   Collection Time: 03/16/20 10:03 AM   Specimen:  Nasopharyngeal Swab  Result Value Ref Range Status   SARS Coronavirus 2 NEGATIVE NEGATIVE Final    Comment: (NOTE) SARS-CoV-2 target nucleic acids are NOT DETECTED.  The SARS-CoV-2 RNA is generally detectable in upper and lower respiratory specimens during the acute phase of infection. The lowest concentration of SARS-CoV-2 viral copies this assay can detect is 250 copies / mL. A negative result does not preclude SARS-CoV-2 infection and should not be used as the sole basis for treatment or other patient management decisions.  A negative result may occur with improper specimen collection / handling, submission of specimen other than nasopharyngeal swab, presence of viral mutation(s) within the areas targeted by this assay, and inadequate number of viral copies (<250 copies / mL). A negative result must be combined with clinical observations, patient history, and epidemiological information.  Fact Sheet for Patients:   StrictlyIdeas.no  Fact Sheet for Healthcare Providers: BankingDealers.co.za  This test is not yet approved or  cleared by  the [place] FDA and has been authorized for detection and/or diagnosis of SARS-CoV-2 by FDA under an Emergency Use Authorization (EUA).  This EUA will remain in effect (meaning this test can be used) for the duration of the COVID-19 declaration under Section 564(b)(1) of the Act, 21 U.S.C. section 360bbb-3(b)(1), unless the authorization is terminated or revoked sooner.  Performed at [facility], [address] [place], [place] [address]   Blood Culture (routine x 2)     Status: Abnormal   Collection Time: 03/16/20 10:22 AM   Specimen: BLOOD  Result Value Ref Range Status   Specimen Description   Final    BLOOD LEFT ANTECUBITAL Performed at [facility], [address] [place], [place] [address]    Special Requests   Final    BOTTLES DRAWN AEROBIC ONLY Blood Culture adequate volume Performed at [facility], [address] [place], [place] [address]    Culture  Setup Time   Final    AEROBIC BOTTLE ONLY GRAM POSITIVE COCCI IN CHAINS Gram Stain Report Called to,Read Back By and Verified With: C THOMAS,[email or web address]  03/17/20 MKELLY Organism ID to follow CRITICAL RESULT CALLED TO, READ BACK BY AND VERIFIED WITH: T MAYO RN 03/17/20 0618 JDW Performed at [facility], [address] [place], [place] [address]    Culture STREPTOCOCCUS GROUP G (A)  Final   Report Status 03/19/2020 FINAL  Final   Organism ID, Bacteria STREPTOCOCCUS GROUP G  Final      Susceptibility   Streptococcus group g - MIC*    CLINDAMYCIN <=0.25 SENSITIVE Sensitive     AMPICILLIN <=0.25 SENSITIVE Sensitive     ERYTHROMYCIN <=0.12 SENSITIVE Sensitive     VANCOMYCIN <=0.12 SENSITIVE Sensitive     CEFTRIAXONE <=0.12 SENSITIVE Sensitive     LEVOFLOXACIN 0.5 SENSITIVE Sensitive     PENICILLIN Value in next row Sensitive      SENSITIVE<=0.06    * STREPTOCOCCUS GROUP G  Blood Culture (routine x 2)     Status: None   Collection Time: 03/16/20 10:22 AM   Specimen: BLOOD  Result Value Ref Range Status    Specimen Description BLOOD LEFT HAND  Final   Special Requests   Final    BOTTLES DRAWN AEROBIC ONLY Blood Culture adequate volume   Culture   Final    NO GROWTH 5 DAYS Performed at [facility], [address], [place], [place] [address]    Report Status 03/21/2020 FINAL  Final  Blood Culture ID Panel (Reflexed)  Status: Abnormal   Collection Time: 03/16/20 10:22 AM  Result Value Ref Range Status   Enterococcus faecalis NOT DETECTED NOT DETECTED Final   Enterococcus Faecium NOT DETECTED NOT DETECTED Final   Listeria monocytogenes NOT DETECTED NOT DETECTED Final   Staphylococcus species NOT DETECTED NOT DETECTED Final   Staphylococcus aureus (BCID) NOT DETECTED NOT DETECTED Final   Staphylococcus epidermidis NOT DETECTED NOT DETECTED Final   Staphylococcus lugdunensis NOT DETECTED NOT DETECTED Final   Streptococcus species DETECTED (A) NOT DETECTED Final    Comment: Not Enterococcus species, Streptococcus agalactiae, Streptococcus pyogenes, or Streptococcus pneumoniae. CRITICAL RESULT CALLED TO, READ BACK BY AND VERIFIED WITH: T MAYO RN 03/17/20 0618 JDW    Streptococcus agalactiae NOT DETECTED NOT DETECTED Final   Streptococcus pneumoniae NOT DETECTED NOT DETECTED Final   Streptococcus pyogenes NOT DETECTED NOT DETECTED Final   A.calcoaceticus-baumannii NOT DETECTED NOT DETECTED Final   Bacteroides fragilis NOT DETECTED NOT DETECTED Final   Enterobacterales NOT DETECTED NOT DETECTED Final   Enterobacter cloacae complex NOT DETECTED NOT DETECTED Final   Escherichia coli NOT DETECTED NOT DETECTED Final   Klebsiella aerogenes NOT DETECTED NOT DETECTED Final   Klebsiella oxytoca NOT DETECTED NOT DETECTED Final   Klebsiella pneumoniae NOT DETECTED NOT DETECTED Final   Proteus species NOT DETECTED NOT DETECTED Final   Salmonella species NOT DETECTED NOT DETECTED Final   Serratia marcescens NOT DETECTED NOT DETECTED Final   Haemophilus influenzae NOT DETECTED NOT DETECTED Final    Neisseria meningitidis NOT DETECTED NOT DETECTED Final   Pseudomonas aeruginosa NOT DETECTED NOT DETECTED Final   Stenotrophomonas maltophilia NOT DETECTED NOT DETECTED Final   Candida albicans NOT DETECTED NOT DETECTED Final   Candida auris NOT DETECTED NOT DETECTED Final   Candida glabrata NOT DETECTED NOT DETECTED Final   Candida krusei NOT DETECTED NOT DETECTED Final   Candida parapsilosis NOT DETECTED NOT DETECTED Final   Candida tropicalis NOT DETECTED NOT DETECTED Final   Cryptococcus neoformans/gattii NOT DETECTED NOT DETECTED Final    Comment: Performed at [facility], [address] [place], [place] [address]  MRSA PCR Screening     Status: None   Collection Time: 03/16/20  8:44 PM   Specimen: Nasopharyngeal  Result Value Ref Range Status   MRSA by PCR NEGATIVE NEGATIVE Final    Comment:        The GeneXpert MRSA Assay (FDA approved for NASAL specimens only), is one component of a comprehensive MRSA colonization surveillance program. It is not intended to diagnose MRSA infection nor to guide or monitor treatment for MRSA infections. Performed at [facility], [address] [place], [place] [address]   Culture, blood (Routine X 2) w Reflex to ID Panel     Status: None   Collection Time: 03/19/20  8:44 AM   Specimen: Right Antecubital; Blood  Result Value Ref Range Status   Specimen Description   Final    RIGHT ANTECUBITAL BOTTLES DRAWN AEROBIC AND ANAEROBIC   Special Requests Blood Culture adequate volume  Final   Culture   Final    NO GROWTH 5 DAYS Performed at [facility], [address] [place], [place] [address]    Report Status 03/24/2020 FINAL  Final  Culture, blood (Routine X 2) w Reflex to ID Panel     Status: None   Collection Time: 03/19/20  8:44 AM   Specimen: BLOOD RIGHT HAND  Result Value Ref Range Status   Specimen Description   Final  BLOOD RIGHT HAND BOTTLES DRAWN AEROBIC AND ANAEROBIC   Special Requests Blood Culture adequate volume   Final   Culture   Final    NO GROWTH 5 DAYS Performed at [facility], [address] [place], [place] [address]    Report Status 03/24/2020 FINAL  Final  MRSA PCR Screening     Status: None   Collection Time: 03/21/20  1:20 PM   Specimen: Nasopharyngeal  Result Value Ref Range Status   MRSA by PCR NEGATIVE NEGATIVE Final    Comment:        The GeneXpert MRSA Assay (FDA approved for NASAL specimens only), is one component of a comprehensive MRSA colonization surveillance program. It is not intended to diagnose MRSA infection nor to guide or monitor treatment for MRSA infections. Performed at [facility], [address] [place], [place] [address]   Culture, respiratory     Status: None   Collection Time: 03/21/20  3:10 PM   Specimen: Bronchoalveolar Lavage; Respiratory  Result Value Ref Range Status   Specimen Description   Final    BRONCHIAL ALVEOLAR LAVAGE Performed at [facility], [address], [place], [place] [address]    Special Requests   Final    NONE Performed at [facility], [address], [place], [place] [address]    Gram Stain NO WBC SEEN NO ORGANISMS SEEN   Final   Culture   Final    Normal respiratory flora-no Staph aureus or Pseudomonas seen Performed at [name] [address] [place], [place] [address]    Report Status 03/24/2020 FINAL  Final    Radiology Reports DG Chest 2 View  Result Date: 03/20/2020 CLINICAL DATA:  Shortness of breath. EXAM: CHEST - 2 VIEW COMPARISON:  March 19, 2020. FINDINGS: There is stable complete opacification of left hemithorax most likely due to a combination of effusion and atelectasis. Sternotomy wires are noted. Stable right basilar opacity is noted concerning for edema or atelectasis. No pneumothorax is noted. IMPRESSION: Stable complete opacification of left hemithorax most likely due to combination of effusion and atelectasis. Stable right basilar opacity concerning for edema or atelectasis.  Electronically Signed   By: Marijo Conception M.D.   On: 03/20/2020 14:41   CT CHEST WO CONTRAST  Addendum Date: 03/23/2020   ADDENDUM REPORT: 03/23/2020 18:53 ADDENDUM: Transcription error: Impression point #4 should read: Dense mitral annular calcifications, could consider nonemergent echocardiography for evaluation of valve/cardiac function. Electronically Signed   By: Lovena Le M.D.   On: 03/23/2020 18:53   Result Date: 03/23/2020 CLINICAL DATA:  Shortness of breath, history of CHF, COPD. Thoracentesis 03/21/2019 EXAM: CT CHEST WITHOUT CONTRAST TECHNIQUE: Multidetector CT imaging of the chest was performed following the standard protocol without IV contrast. COMPARISON:  Chest CT 05/19/2018 FINDINGS: Imaging quality is severely degraded due to patient body habitus resulting in extensive photon starvation which also exacerbates the streak artifact from patient's hardware Cardiovascular: Cardiomegaly with four-chamber cardiac enlargement. Dense calcification of mitral annulus. Three-vessel coronary artery atherosclerosis. Focal pericardial thickening towards the apical septum (4/99). Atherosclerotic plaque within the normal caliber aorta. No periaortic stranding or hemorrhage. Normal 3 vessel branching of the aortic arch. Proximal great vessels are calcified but otherwise of grossly unremarkable. Central pulmonary artery enlargement, nonspecific but similar to prior and possibly reflecting some chronic pulmonary hypertension. Luminal evaluation precluded in the absence of contrast media. Pulmonary vascularity within the lung parenchyma is cephalized and redistributed. Luminal evaluation of the vasculature precluded in the  absence of contrast. Mediastinum/Nodes: Mild edematous changes in the mediastinum. Few enlarged and borderline enlarged low-attenuation mediastinal nodes are present including a 14 mm subcarinal node (2/71). No axillary adenopathy. Hilar nodal evaluation is severely limited in the absence  intravenous contrast media. Postsurgical changes in the anterior mediastinum likely related prior sternotomy and CABG. Endotracheal tube terminates low within the trachea, 1.7 cm from the carina. Consider retracting at least 2 cm to the mid trachea. Transesophageal tube is in place. Tip terminates below the level of imaging with but with the side port appropriately positioned distal to the GE junction. Thyroid gland and thoracic inlet are unremarkable. Lungs/Pleura: Moderate to large bilateral pleural effusions, right greater than left with essentially complete atelectatic collapse of both lower lobes, subsegmental collapse of the right middle lobe and lingula as well. Underlying consolidation is not excluded particularly given areas of mixed consolidation and ground-glass within the aerated portions of the upper lungs. Some mild interlobular septal thickening is noted as well. No pneumothorax. Upper Abdomen: Small amount of subdiaphragmatic ascites. Questionable stranding centered upon the pancreatic tail. Upper abdominal atherosclerosis. Musculoskeletal: Appearance of extensive bridging syndesmophytes as well as fusion across the spinous processes with some exaggerated thoracic kyphosis. Could correlate for features ankylosing spondylitis. No acute fracture or suspicious osseous lesions. Prior sternotomy as well. Lower thoracolumbar fusion hardware is noted including vertebral augmentation changes at the T11 and 12 levels. Additional degenerative changes in both shoulders. Moderate body wall edema. IMPRESSION: 1. Imaging quality is severely degraded due to patient body habitus resulting in extensive photon starvation which also exacerbates the streak artifact from patient's hardware. 2. Moderate to large bilateral pleural effusions, right greater than left, with essentially complete atelectatic collapse of both lower lobes, subsegmental collapse of the right middle lobe and lingula as well. Underlying  consolidation in the regions of volume loss is not excluded with evidence of additional areas of consolidation and ground-glass within the aerated portions of the upper lungs concerning for an acute infectious/inflammatory process including atypical viral etiologies. 3. Additional features throughout the chest suggesting CHF/volume overload with pulmonary edema, cardiomegaly, ascites and body wall edema. 4. Dense mitral annular calcifications, could consider nonemergent echocardiography for evaluation of valvulae op a 3. 5. Focal pericardial thickening towards the apical septum. Nonspecific appearance. Could correlate for features of pericarditis or ischemic change. 6. Central pulmonary artery enlargement, nonspecific and incompletely evaluated in the absence of contrast media but similar to prior and possibly reflecting some chronic pulmonary hypertension. 7. Small amount of subdiaphragmatic ascites. 8. Questionable stranding centered upon the pancreatic tail. Possibly redistributed ascites though should correlate with lipase to exclude pancreatitis. 9. Appearance of extensive bridging syndesmophytes as well as fusion across the spinous processes with some exaggerated thoracic kyphosis. Such imaging features can be seen in the setting of ankylosing spondylitis. 10. Aortic Atherosclerosis (ICD10-I70.0). Electronically Signed: By: Lovena Le M.D. On: 03/21/2020 18:16   [place] CHEST (PLEURAL EFFUSION)  Result Date: 03/20/2020 CLINICAL DATA:  Dyspnea. EXAM: CHEST ULTRASOUND - LIMITED COMPARISON:  Chest radiograph 03/20/2020 FINDINGS: Left chest sonography demonstrates a moderate left pleural effusion. We were unable to sit the patient up to perform thoracentesis. The patient was very stiff and even with several caregivers holding the patient, we were not able to hold him in a steady position at the site of the bed adequate to perform thoracentesis. There is too great of a risk of him falling or sliding during  thoracentesis due to his rigidity in the hips and legs. IMPRESSION:  1. At least moderate left pleural effusion is visualized sonographically. Thoracentesis was not attempted as the patient could not be positioned adequately. Electronically Signed   By: Van Clines M.D.   On: 03/20/2020 15:00   DG CHEST PORT 1 VIEW  Result Date: 03/23/2020 CLINICAL DATA:  OG tube placement EXAM: PORTABLE CHEST 1 VIEW COMPARISON:  Radiograph 03/23/2020 FINDINGS: Endotracheal tube tip in the mid to lower trachea, 2.7 cm from the carina. Transesophageal tube tip terminates below the level of imaging with the side port beyond the GE junction. Telemetry leads overlie the chest. Postsurgical changes from prior sternotomy. Persistent layering bilateral effusions with hazy opacities in the lung bases likely reflecting combination of airspace disease, atelectasis and superimposed edema. IMPRESSION: 1. Endotracheal tube tip in the mid to lower trachea, 2.7 cm from the carina. 2. Transesophageal tube tip terminates below the level of imaging with the side port beyond the GE junction. 3. Persistent layering effusions. 4. Residual airspace opacities could reflect edema, atelectasis or airspace disease or combination there of. Electronically Signed   By: Lovena Le M.D.   On: 03/23/2020 18:54   DG Chest Port 1 View  Result Date: 03/23/2020 CLINICAL DATA:  Respiratory failure. EXAM: PORTABLE CHEST 1 VIEW COMPARISON:  03/21/2020 FINDINGS: The endotracheal tube and NG tubes are stable. Stable mild cardiac enlargement. Significant interval improved aeration of the left lung probable resolution of mucous plugging. Persistent left basilar atelectasis and small bilateral effusions. IMPRESSION: Stable support apparatus. Significant interval improved aeration of the left lung with probable resolution of mucous plugging. Electronically Signed   By: Marijo Sanes M.D.   On: 03/23/2020 07:21   DG CHEST PORT 1 VIEW  Result Date:  03/21/2020 CLINICAL DATA:  Hypoxia EXAM: PORTABLE CHEST 1 VIEW COMPARISON:  March 21, 2020 study obtained earlier in the day FINDINGS: Endotracheal tube tip is 4.5 cm above the carina. Nasogastric tube tip is in the proximal stomach with the side port near the gastroesophageal junction, not well seen. No pneumothorax. There is extensive pleural effusion on the left with essentially complete opacification of the left hemithorax. There may well be underlying atelectasis and infiltrate on the left. On the right, there is apparent loculated effusion in the lateral right base. Right lung otherwise clear. Heart is prominent, stable, with evidence of previous median sternotomy. There is aortic atherosclerosis. Pulmonary vascularity on the right appears unremarkable. Pulmonary vascularity on the left is obscured fluid. No adenopathy evident in areas that can be assessed for potential adenopathy. Postoperative change noted in the lower thoracic and lumbar regions. IMPRESSION: Tube and catheter positions as described without pneumothorax. It may be prudent to consider advancing endotracheal tube 4-5 cm to insure that both tube tip and side port are well within the stomach. Extensive opacification on the left with essentially complete opacification of the left hemithorax, likely due to combination of fluid and atelectasis. Superimposed pneumonia on the left cannot be excluded. Probable loculated pleural effusion lateral right base. Right lung otherwise clear. Stable cardiac silhouette. Aortic Atherosclerosis (ICD10-I70.0). Electronically Signed   By: Lowella Grip III M.D.   On: 03/21/2020 15:23   DG CHEST PORT 1 VIEW  Result Date: 03/21/2020 CLINICAL DATA:  Shortness of breath, history CHF, COPD, atrial fibrillation, obesity, was COVID-19 negative on 03/16/2020, post thoracentesis 03/20/2020 EXAM: PORTABLE CHEST 1 VIEW COMPARISON:  Portable exam 1342 hours compared to 03/20/2020 FINDINGS: Rotated to the LEFT.  Upper normal size of cardiac silhouette post median sternotomy. Pulmonary vascular congestion. Bibasilar effusions  and atelectasis, significantly decreased on LEFT since previous exam. Minimal pulmonary edema question. No pneumothorax. Bones demineralized with prior thoracolumbar fusion. IMPRESSION: Bibasilar pleural effusions and atelectasis, decreased on LEFT since prior exam. Question minimal pulmonary edema. Electronically Signed   By: Lavonia Dana M.D.   On: 03/21/2020 14:02   DG CHEST PORT 1 VIEW  Result Date: 03/19/2020 CLINICAL DATA:  Worsening dyspnea today, COPD, diabetes mellitus, hypertension, negative COVID-19 test on 03/16/2020 EXAM: PORTABLE CHEST 1 VIEW COMPARISON:  Portable exam 1603 hours compared to 03/16/2020 FINDINGS: Opacification of LEFT hemithorax by pleural effusion and atelectasis increased from prior study. LEFT heart border obscured. Patchy infiltrates mid to lower RIGHT lung with minimal basilar atelectasis and probable small pleural effusion. No pneumothorax. Bones demineralized. IMPRESSION: Patchy RIGHT lung infiltrates with basilar pleural effusion and atelectasis. Opacified LEFT hemithorax by combination of atelectasis and pleural effusion, underlying infiltrate not excluded. Electronically Signed   By: Lavonia Dana M.D.   On: 03/19/2020 16:21   DG Chest Port 1 View  Result Date: 03/16/2020 CLINICAL DATA:  Concern for sepsis. EXAM: PORTABLE CHEST 1 VIEW COMPARISON:  06/17/2019 chest radiograph and prior. FINDINGS: Left basilar/retrocardiac patchy opacities. No pneumothorax. Small left pleural effusion. Cardiomegaly and prominence of the central pulmonary vessels. Post sternotomy sequela. IMPRESSION: Cardiomegaly and central pulmonary vascular congestion. Left basilar opacities. Differential includes infection, edema and atelectasis. Electronically Signed   By: Primitivo Gauze M.D.   On: 03/16/2020 08:14   ECHOCARDIOGRAM COMPLETE  Result Date: 03/18/2020    ECHOCARDIOGRAM  REPORT   Patient Name:   Cody Hale Date of Exam: 03/18/2020 Medical Rec #:  [id number]    Height:       69.0 in Accession #:    [id number]   Weight:       240.0 lb Date of Birth:  September 18, 1950    BSA:          2.232 m Patient Age:    65 years     BP:           110/68 mmHg Patient Gender: M            HR:           67 bpm. Exam Location:  [facility] Procedure: 2D Echo and Intracardiac Opacification Agent Indications:    Bacteremia R78.81  History:        Patient has prior history of Echocardiogram examinations, most                 recent 05/20/2019. CHF, COPD, Arrythmias:Atrial Fibrillation;                 Risk Factors:Hypertension and Diabetes.                  Mitral Valve: 29 mm St. Jude tissue valve valve is present in                 the mitral position. Procedure Date: 09/07/2012.  Sonographer:    Mikki Santee RDCS (AE) Referring Phys: [id number] COURAGE EMOKPAE IMPRESSIONS  1. Left ventricular ejection fraction, by estimation, is 35 to 40%. The left ventricle has moderately decreased function. The left ventricle demonstrates global hypokinesis. There is mild concentric left ventricular hypertrophy. Left ventricular diastolic parameters are indeterminate.  2. Right ventricular systolic function is normal. The right ventricular size is normal. There is moderately elevated pulmonary artery systolic pressure. The estimated right ventricular systolic pressure is 51.8 mmHg.  3. Left atrial size was severely dilated.  4. The mitral valve has been repaired/replaced. No evidence of mitral valve regurgitation. Moderate to severe mitral stenosis. The mean mitral valve gradient is 11.7 mmHg at 89bpm. There is a 29 mm St. Jude tissue valve present in the mitral position. Procedure Date: 09/07/2012. Echo findings are consistent with normal structure and function of the mitral valve prosthesis.  5. The aortic valve is tricuspid. Aortic valve regurgitation is not visualized. Mild aortic valve sclerosis is present, with no  evidence of aortic valve stenosis.  6. The inferior vena cava is dilated in size with <50% respiratory variability, suggesting right atrial pressure of 15 mmHg.  7. Compared to prior study 05/2018, the mean MV gradient of the MV tissue valve has increased from 8 to 33mHg. FINDINGS  Left Ventricle: Left ventricular ejection fraction, by estimation, is 35 to 40%. The left ventricle has moderately decreased function. The left ventricle demonstrates global hypokinesis. Definity contrast agent was given IV to delineate the left ventricular endocardial borders. The left ventricular internal cavity size was normal in size. There is mild concentric left ventricular hypertrophy. Left ventricular diastolic parameters are indeterminate. Right Ventricle: The right ventricular size is normal. No increase in right ventricular wall thickness. Right ventricular systolic function is normal. There is moderately elevated pulmonary artery systolic pressure. The tricuspid regurgitant velocity is 3.16 m/s, and with an assumed right atrial pressure of 15 mmHg, the estimated right ventricular systolic pressure is 578.2mmHg. Left Atrium: Left atrial size was severely dilated. Right Atrium: Right atrial size was normal in size. Pericardium: There is no evidence of pericardial effusion. Mitral Valve: The mitral valve has been repaired/replaced. No evidence of mitral valve regurgitation. There is a 29 mm St. Jude tissue valve present in the mitral position. Procedure Date: 09/07/2012. Echo findings are consistent with normal structure and function of the mitral valve prosthesis. Moderate to severe mitral valve stenosis. MV peak gradient, 28.7 mmHg. The mean mitral valve gradient is 11.7 mmHg. Tricuspid Valve: The tricuspid valve is normal in structure. Tricuspid valve regurgitation is mild . No evidence of tricuspid stenosis. Aortic Valve: The aortic valve is tricuspid. Aortic valve regurgitation is not visualized. Mild aortic valve sclerosis  is present, with no evidence of aortic valve stenosis. Pulmonic Valve: The pulmonic valve was normal in structure. Pulmonic valve regurgitation is trivial. No evidence of pulmonic stenosis. Aorta: The aortic root is normal in size and structure. Venous: The inferior vena cava is dilated in size with less than 50% respiratory variability, suggesting right atrial pressure of 15 mmHg. IAS/Shunts: No atrial level shunt detected by color flow Doppler.  LEFT VENTRICLE PLAX 2D LVIDd:         5.42 cm LVIDs:         4.46 cm LV PW:         1.22 cm LV IVS:        1.30 cm LVOT diam:     2.50 cm LV SV:         59 LV SV Index:   26 LVOT Area:     4.91 cm  RIGHT VENTRICLE TAPSE (M-mode): 1.0 cm LEFT ATRIUM              Index       RIGHT ATRIUM           Index LA diam:        5.10 cm  2.28 cm/m  RA Area:     20.30 cm LA Vol (A2C):   121.0 ml 54.20  ml/m RA Volume:   59.20 ml  26.52 ml/m LA Vol (A4C):   114.0 ml 51.06 ml/m LA Biplane Vol: 123.0 ml 55.10 ml/m  AORTIC VALVE LVOT Vmax:   72.10 cm/s LVOT Vmean:  48.067 cm/s LVOT VTI:    0.120 m  AORTA Ao Root diam: 3.60 cm MITRAL VALVE              TRICUSPID VALVE MV Area (PHT): 2.16 cm   TR Peak grad:   39.9 mmHg MV Peak grad:  28.7 mmHg  TR Vmax:        316.00 cm/s MV Mean grad:  11.7 mmHg MV Vmax:       2.68 m/s   SHUNTS MV Vmean:      159.3 cm/s Systemic VTI:  0.12 m                           Systemic Diam: 2.50 cm Fransico Him MD Electronically signed by Fransico Him MD Signature Date/Time: 03/18/2020/4:27:42 PM    Final      CBC Recent Labs  Lab 03/19/20 0840 03/21/20 0554 03/22/20 0332 03/23/20 0307 03/25/20 0412  WBC 6.0 5.9 6.2 7.3 4.4  HGB 11.3* 11.8* 10.7* 10.5* 10.4*  HCT 38.0* 40.4 36.1* 34.8* 35.6*  PLT 108* 146* 139* 155 159  MCV 97.2 98.3 96.3 94.3 97.8  MCH 28.9 28.7 28.5 28.5 28.6  MCHC 29.7* 29.2* 29.6* 30.2 29.2*  RDW 16.5* 16.1* 16.1* 16.4* 16.4*    Chemistries  Recent Labs  Lab 03/21/20 0554 03/21/20 1521 03/21/20 1840 03/22/20 0332  03/22/20 1647 03/23/20 0307 03/24/20 0536 03/25/20 0412  NA 144  --   --  145  --  145 145 146*  K 5.0  --   --  4.2  --  3.6 3.2* 3.6  CL 106  --   --  108  --  105 104 105  CO2 32  --   --  30  --  32 34* 35*  GLUCOSE 145*  --   --  121*  --  182* 150* 123*  BUN 31*  --   --  35*  --  35* 31* 21  CREATININE 0.62  --   --  0.62  --  0.59* 0.55* 0.47*  CALCIUM 9.0  --   --  8.5*  --  8.1* 7.8* 8.1*  MG  --  2.3 2.3 2.3 2.0  --   --   --   AST 18  --   --  26  --   --   --   --   ALT 17  --   --  21  --   --   --   --   ALKPHOS 73  --   --  73  --   --   --   --   BILITOT 0.8  --   --  0.6  --   --   --   --     Lab Results  Component Value Date   HGBA1C 5.6 03/01/2020   Coagulation profile No results for input(s): INR, PROTIME in the last 168 hours.  ------------------------------------------------------------------------------------------------------------------    Component Value Date/Time   BNP 368.9 (H) 06/03/2019 2149   Time spent: 35 minutes.  Barton Dubois MD on 03/25/2020 at 10:38 AM  Go to [email or web address] - for contact info  [facility] - Office  [phone]

## 2020-03-26 ENCOUNTER — Inpatient Hospital Stay (HOSPITAL_COMMUNITY): Payer: Medicare Other

## 2020-03-26 LAB — BLOOD GAS, ARTERIAL
Acid-Base Excess: 8.6 mmol/L — ABNORMAL HIGH (ref 0.0–2.0)
Bicarbonate: 31.6 mmol/L — ABNORMAL HIGH (ref 20.0–28.0)
FIO2: 100
O2 Saturation: 98.2 %
Patient temperature: 36.3
pCO2 arterial: 50 mmHg — ABNORMAL HIGH (ref 32.0–48.0)
pH, Arterial: 7.433 (ref 7.350–7.450)
pO2, Arterial: 107 mmHg (ref 83.0–108.0)

## 2020-03-26 LAB — GLUCOSE, CAPILLARY
Glucose-Capillary: 104 mg/dL — ABNORMAL HIGH (ref 70–99)
Glucose-Capillary: 109 mg/dL — ABNORMAL HIGH (ref 70–99)
Glucose-Capillary: 113 mg/dL — ABNORMAL HIGH (ref 70–99)
Glucose-Capillary: 118 mg/dL — ABNORMAL HIGH (ref 70–99)
Glucose-Capillary: 178 mg/dL — ABNORMAL HIGH (ref 70–99)
Glucose-Capillary: 97 mg/dL (ref 70–99)

## 2020-03-26 MED ORDER — METOPROLOL TARTRATE 5 MG/5ML IV SOLN
5.0000 mg | Freq: Three times a day (TID) | INTRAVENOUS | Status: DC
  Filled 2020-03-26: qty 5

## 2020-03-26 MED ORDER — LORAZEPAM 2 MG/ML IJ SOLN: 1.0000 mg | Freq: Once | INTRAMUSCULAR | Status: DC

## 2020-03-26 MED ORDER — METOPROLOL TARTRATE 5 MG/5ML IV SOLN
5.0000 mg | Freq: Three times a day (TID) | INTRAVENOUS | Status: DC
  Administered 2020-03-26 – 2020-03-29 (×5): 5 mg via INTRAVENOUS
  Filled 2020-03-26 (×11): qty 5

## 2020-03-26 MED ORDER — MIDAZOLAM HCL 2 MG/2ML IJ SOLN
1.0000 mg | INTRAMUSCULAR | Status: DC | PRN
  Filled 2020-03-26: qty 2

## 2020-03-26 MED ORDER — FUROSEMIDE 10 MG/ML IJ SOLN
20.0000 mg | Freq: Every day | INTRAMUSCULAR | Status: DC
  Administered 2020-03-26 – 2020-03-30 (×5): 20 mg via INTRAVENOUS
  Filled 2020-03-26 (×5): qty 2

## 2020-03-26 MED ORDER — MIDAZOLAM HCL 2 MG/2ML IJ SOLN
1.0000 mg | INTRAMUSCULAR | Status: DC | PRN
  Administered 2020-03-27: 1 mg via INTRAVENOUS

## 2020-03-26 MED ORDER — FENTANYL CITRATE (PF) 100 MCG/2ML IJ SOLN: 25.0000 ug | INTRAMUSCULAR | Status: DC | PRN

## 2020-03-26 MED ORDER — FREE WATER
100.0000 mL | Freq: Three times a day (TID) | Status: DC
  Administered 2020-03-26 – 2020-03-30 (×12): 100 mL

## 2020-03-26 MED ORDER — FENTANYL CITRATE (PF) 100 MCG/2ML IJ SOLN
25.0000 ug | INTRAMUSCULAR | Status: DC | PRN
  Administered 2020-03-26: 50 ug via INTRAVENOUS
  Administered 2020-03-26 – 2020-03-30 (×9): 100 ug via INTRAVENOUS
  Filled 2020-03-26 (×10): qty 2

## 2020-03-26 MED ORDER — ROCURONIUM BROMIDE 50 MG/5ML IV SOLN
100.0000 mg | Freq: Once | INTRAVENOUS | Status: AC
  Administered 2020-03-26: 100 mg via INTRAVENOUS

## 2020-03-26 MED ORDER — ETOMIDATE 2 MG/ML IV SOLN
20.0000 mg | Freq: Once | INTRAVENOUS | Status: AC
  Administered 2020-03-26: 20 mg via INTRAVENOUS

## 2020-03-26 NOTE — Progress Notes (Signed)
Nurse notified MD about pt's condition steadily declining and requiring more oxygenation, pt's oxygen dropped into the low 60s, nurse started bag valve mask, oxygen increased to the high 80s/ low 90s, orders obtained to prepare for intubation

## 2020-03-26 NOTE — Progress Notes (Signed)
Nurse notified MD about pt became extremely restless, SBP 200s, HR in the 140s at times, and oxygen levels dropping to the low 80s; nurse increase oxygen to 6L at this time.

## 2020-03-26 NOTE — Progress Notes (Signed)
Patient only wore BiPAP briefly he is doing a great deal of coughing thick secretions which he is not clearing.

## 2020-03-26 NOTE — ED Provider Notes (Signed)
Staff from critical care unit called down to the emergency department and needed assistance of patient becoming more hypoxic and concern for airway and possible coding.  On assessment patient confused, hypoxic, respiratory distress, rales bilateral, tachypnea, diaphoretic. Patient being bagged by respiratory therapist, I assisted shortly after arrival.  Discussed with internal medicine hospitalist, patient having worsening respiratory and clinical status since yesterday evening.  Patient is a full code.  Using etomidate and rocuronium patient intubated with glide scope, one attempt.  Capnography checked, portable chest x-ray ordered.  Procedure Name: Intubation Date/Time: 03/26/2020 10:49 AM Performed by: Blane Ohara, MD Pre-anesthesia Checklist: Patient identified, Patient being monitored, Emergency Drugs available, Timeout performed and Suction available Oxygen Delivery Method: Non-rebreather mask Preoxygenation: Pre-oxygenation with 100% oxygen Induction Type: Rapid sequence Ventilation: Mask ventilation without difficulty Laryngoscope Size: Glidescope Grade View: Grade I Tube size: 7.5 mm Number of attempts: 1 Placement Confirmation: ETT inserted through vocal cords under direct vision,  CO2 detector and Breath sounds checked- equal and bilateral Secured at: 24 cm Tube secured with: ETT holder     Acute respiratory failure  Cody Kingfisher, MD 03/26/20 1050

## 2020-03-26 NOTE — Progress Notes (Addendum)
Patient Demographics:    Cody Hale, is a 69 y.o. male, DOB - [birth year], QTM:[id number]  Admit date - 03/16/2020   Admitting Physician Courage Denton Brick, MD  Outpatient Primary MD for the patient is [name], MD  LOS - 10  Chief Complaint  Patient presents with  . Fever        Subjective:    [name] afebrile, no CP, increase work of breathing, productive coughing spells/desaturation. Unable to protect airways and ended requiring to be intubated and mechanically ventilated.    Assessment  & Plan :    Principal Problem:   Sepsis due to pneumonia Active Problems:   OSA (obstructive sleep apnea)   Essential hypertension, malignant   Type II diabetes mellitus with manifestations (HCC)   Chronic systolic CHF (congestive heart failure) (HCC)   Goals of care, counseling/discussion   COPD (chronic obstructive pulmonary disease) with chronic bronchitis (HCC)   Chronic atrial fibrillation   S/P mitral valve replacement with bioprosthetic valve   Major depressive disorder, recurrent severe without psychotic features ([name])   Chronic respiratory failure with hypoxia ([name])   Community acquired pneumonia   Streptococcus G Bacteremia   Endotracheally intubated   Palliative care by specialist  Brief Summary:- 69 y.o. 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, chronic atrial fibrillation with chronic anticoagulation, COPD, diabetes mellitus , h/o T11/L4 fusion after lumbar fracture 11/17/20withWound dehiscence noted12/7/20, foot wound Prior history of dysphagia requiring PEG tube placement 06/04/2019 chronic hypoxic respiratory failure admitted on 03/17/2019 from [facility]F with acute on chronic hypoxic respiratory failure secondary to community-acquired pneumonia -Prior H/o Bacterial  Endocarditis --s/p Mitral Valve Repair Previously Now has Strep group G Bacteremia---Needs TEE  A/p 1)Community-acquired Pneumonia---  --In the ED chest x-ray was suggestive of pulmonary venous congestion Versus pneumonia -However given fevers, leukocytosis cough and dyspnea and hypoxia, favor pneumonia -On IV Rocephin and azithromycin initially; has now completed zithromax therapy and remains on rocephin as per ID rec's. -Repeat chest x-ray yesterday showed stable complete opacification of the left hemithorax likely combination of effusion and atelectasis. -aspiration and bilateral pleural effusion also playing a role in his difficulty breathing. -re-intubated due to increase work of breathing, confusion, desaturation and inability to protect airways. Aspiration/mucus plugging as main culprit.   2)Strep Group G Sepsis --patient had leukocytosis, tachypnea, tachycardia and fevers --- as well as confusion and hypoxia -- suspect secondary to. -UA not suggestive of UTI -Patient also has right foot wound and sacral wounds do not look particularly infected--please see photos in epic --Lactic acid is not elevated -Continue Rocephin blood cultures from 03/16/20 with strep group G,--patient with prior history of endocarditis repeat TTE from 03/18/2020 noted  repeat blood cultures on 03/19/2020 no growth so far. -WBC within normal limits and no longer febrile. -Prior H/o Bacterial Endocarditis --s/p Mitral Valve Repair Previously Now has Strep group G Bacteremia- -If TEE is negative patient may be discharged home on p.o. Keflex to complete 2 weeks of antibiotic therapy for bacteremia (2 weeks from first negative culture) --Phone consult with Dr. Bobby Rumpf from infectious disease -TEE still pending at this time.  3) chronic anemia----hemoglobin currently above 11  which is higher than recent baseline, -No bleeding concerns at this time -Continue to follow hemoglobin trend.  4) chronic atrial  fibrillation    -on anticoagulation with Xarelto -Rate controlled overall.  5)Bioprosthetic Mitral Valve Replacement after Endocarditis in 2014--repeat echo from 03/18/20 showed---mitral valve has been repaired/replaced. No evidence of mitral valve regurgitation. Moderate to severe mitral stenosis. The mean mitral valve gradient is 11.7 mmHg at 89bpm. There is a 29 mm St. Jude tissue valve present in the mitral position.  --Cardiology consult for possible TEE requested due to strep group G bacteremia -Unable to do TEE secondary to his respiratory status. -Continue current IV antibiotics.  6)DM2-  -Continue sliding scale insulin -Follow CBGs and adjust hypoglycemic regimen as required.   7) decubitus ulcers--mostly on the buttocks and right foot----wound care consult appreciated, -Continue to follow wound care recommendations and preventive measures -pressure injuries were present on admission.   8) acute on chronic hypoxic respiratory failure--- secondary to #1 above, with aspiration pneumonitis and concern for mucous plugging  -Currently intubated and mechanically ventilated -Successfully extubated 03/24/20; unfortunately developed episode of increase work of breathing, desaturation, confusion and inability to protect airways on 03/1920, patient was re-intubated. I believed main cause was aspiration/mucus plugging. -continue ventilatory support, will most likely bronchoscopy; follow pulmonologist rec's.  -Continue scheduled bronchodilators, continue IV Lasix to further assist drying lungs -Continue mucolytics  9-BPH -continue flomax  Status is: Inpatient   Dispo: The patient is from: ALF  Anticipated d/c is to:  To be determined; but possible will require skilled nursing facility at discharge.  Anticipated d/c date is: Unknown at this time.               Patient currently is not medically stable to d/c.  Barriers: Not Clinically  Stable-pneumonia/sepsis strep group G bacteremia requiring IV antibiotics.  Also having acute on chronic hypoxemic respiratory failure, requiring intubation and mechanical ventilatory support. He was extubated on 03-24-20 and re-intubated on 03/26/20. Continue current IV antibiotics, follow clinical response and recommendations by pulmonologist.  Code Status : full  Family Communication: daughter was updated by phone and all questions answered (03/26/20).  Consults  :  Phone consult with ID and Cardiologist DVT Prophylaxis  :  Xarelto- SCDs   Lab Results  Component Value Date   PLT 159 03/25/2020    Inpatient Medications  Scheduled Meds: . ascorbic acid  500 mg Per Tube Daily  . Chlorhexidine Gluconate Cloth  6 each Topical Daily  . docusate  100 mg Oral BID  . feeding supplement (PROSource TF)  45 mL Per Tube 6 X Daily  . ferrous sulfate  300 mg Per Tube BID WC  . Gerhardt's butt cream   Topical QID  . guaiFENesin  600 mg Oral BID  . [START ON 03/27/2020] influenza vaccine adjuvanted  0.5 mL Intramuscular Tomorrow-1000  . insulin aspart  0-20 Units Subcutaneous Q4H  . ipratropium-albuterol  3 mL Nebulization Q6H  . LORazepam  1 mg Intravenous Once  . metoCLOPramide  5 mg Per Tube Q8H  . metoprolol tartrate  5 mg Intravenous Q8H  . multivitamin with minerals  1 tablet Per Tube Daily  . pantoprazole sodium  40 mg Per Tube Q24H  . polyethylene glycol  17 g Oral Daily  . rivaroxaban  20 mg Per Tube Q supper  . rosuvastatin  5 mg Per Tube Daily  . sodium chloride flush  3 mL Intravenous Q12H  . tamsulosin  0.4 mg Oral QPC breakfast  .  Vilazodone HCl  40 mg Oral Daily   Continuous Infusions: . sodium chloride    . cefTRIAXone (ROCEPHIN)  IV 2 g (03/26/20 0942)   PRN Meds:.sodium chloride, acetaminophen, albuterol, fentaNYL (SUBLIMAZE) injection, fentaNYL (SUBLIMAZE) injection, midazolam, midazolam, [DISCONTINUED] ondansetron **OR** ondansetron (ZOFRAN) IV, polyethylene glycol,  sodium chloride flush    Anti-infectives (From admission, onward)   Start     Dose/Rate Route Frequency Ordered Stop   03/17/20 0830  cefTRIAXone (ROCEPHIN) 2 g in sodium chloride 0.9 % 100 mL IVPB        2 g 200 mL/hr over 30 Minutes Intravenous Every 24 hours 03/17/20 0813     03/16/20 1945  cefTRIAXone (ROCEPHIN) 1 g in sodium chloride 0.9 % 100 mL IVPB  Status:  Discontinued        1 g 200 mL/hr over 30 Minutes Intravenous Every 24 hours 03/16/20 1933 03/17/20 0813   03/16/20 1945  azithromycin (ZITHROMAX) 500 mg in sodium chloride 0.9 % 250 mL IVPB  Status:  Discontinued        500 mg 250 mL/hr over 60 Minutes Intravenous Every 24 hours 03/16/20 1933 03/21/20 1206   03/16/20 1115  cefTRIAXone (ROCEPHIN) 1 g in sodium chloride 0.9 % 100 mL IVPB        1 g 200 mL/hr over 30 Minutes Intravenous  Once 03/16/20 1103 03/16/20 1210   03/16/20 1115  azithromycin (ZITHROMAX) 500 mg in sodium chloride 0.9 % 250 mL IVPB        500 mg 250 mL/hr over 60 Minutes Intravenous  Once 03/16/20 1103 03/16/20 1326        Objective:   Vitals:   03/26/20 1009 03/26/20 1025 03/26/20 1046 03/26/20 1048  BP:  (!) 160/101 (!) 154/80   Pulse:      Resp: (!) _0 Temp:    (!) 97.4 F (36.3 C)  TempSrc:    Oral  SpO2: (!) 83% (!) 88% 98%   Height:        Wt Readings from Last 3 Encounters:  03/01/20 110.7 kg  02/17/20 102.1 kg  02/10/20 108.9 kg     Intake/Output Summary (Last 24 hours) at 03/26/2020 1052 Last data filed at 03/26/2020 0500 Gross per 24 hour  Intake 240.38 ml  Output 750 ml  Net -509.62 ml    Physical Exam General exam: patient in acute resp distress, confused/difficult to redirect and with increased work of breathing; O2 sat down to low 70's on HFNC 15L. Respiratory system: diffuse rhonchi, no wheezing, using accessory muscles and tachypneic. Cardiovascular system: irregular, irregular, no rubs, no gallops, soft murmur appreciated. No JVD. Gastrointestinal system:  Abdomen is obese, nondistended, soft and nontender. No organomegaly or masses felt. Normal bowel sounds heard. Central nervous system:confused; No focal neurological deficits. Extremities: No cyanosis, no clubbing, trace edema bilaterally. Skin: No petechiae, unchanged pressure injuries in his right foot and decubitus area. No signs of superimposed infection.  Psychiatry: Judgement and insight appear normal. Mood & affect appropriate.      Data Review:   Micro Results Recent Results (from the past 240 hour(s))  MRSA PCR Screening     Status: None   Collection Time: 03/16/20  8:44 PM   Specimen: Nasopharyngeal  Result Value Ref Range Status   MRSA by PCR NEGATIVE NEGATIVE Final    Comment:        The GeneXpert MRSA Assay (FDA approved for NASAL specimens only), is one component of a comprehensive MRSA colonization  surveillance program. It is not intended to diagnose MRSA infection nor to guide or monitor treatment for MRSA infections. Performed at [facility], [address] [place], [place] [address]   Culture, blood (Routine X 2) w Reflex to ID Panel     Status: None   Collection Time: 03/19/20  8:44 AM   Specimen: Right Antecubital; Blood  Result Value Ref Range Status   Specimen Description   Final    RIGHT ANTECUBITAL BOTTLES DRAWN AEROBIC AND ANAEROBIC   Special Requests Blood Culture adequate volume  Final   Culture   Final    NO GROWTH 5 DAYS Performed at [facility], [address] [place], [place] [address]    Report Status 03/24/2020 FINAL  Final  Culture, blood (Routine X 2) w Reflex to ID Panel     Status: None   Collection Time: 03/19/20  8:44 AM   Specimen: BLOOD RIGHT HAND  Result Value Ref Range Status   Specimen Description   Final    BLOOD RIGHT HAND BOTTLES DRAWN AEROBIC AND ANAEROBIC   Special Requests Blood Culture adequate volume  Final   Culture   Final    NO GROWTH 5 DAYS Performed at [facility], [address] [place], [place] [address]     Report Status 03/24/2020 FINAL  Final  MRSA PCR Screening     Status: None   Collection Time: 03/21/20  1:20 PM   Specimen: Nasopharyngeal  Result Value Ref Range Status   MRSA by PCR NEGATIVE NEGATIVE Final    Comment:        The GeneXpert MRSA Assay (FDA approved for NASAL specimens only), is one component of a comprehensive MRSA colonization surveillance program. It is not intended to diagnose MRSA infection nor to guide or monitor treatment for MRSA infections. Performed at [facility], [address] [place], [place] [address]   Culture, respiratory     Status: None   Collection Time: 03/21/20  3:10 PM   Specimen: Bronchoalveolar Lavage; Respiratory  Result Value Ref Range Status   Specimen Description   Final    BRONCHIAL ALVEOLAR LAVAGE Performed at [facility], [address] [place], [place] [address]    Special Requests   Final    NONE Performed at [facility], [address], [place], [place] [address]    Gram Stain NO WBC SEEN NO ORGANISMS SEEN   Final   Culture   Final    Normal respiratory flora-no Staph aureus or Pseudomonas seen Performed at [facility] [address] [place], [place] [address]    Report Status 03/24/2020 FINAL  Final    Radiology Reports DG Chest 2 View  Result Date: 03/20/2020 CLINICAL DATA:  Shortness of breath. EXAM: CHEST - 2 VIEW COMPARISON:  March 19, 2020. FINDINGS: There is stable complete opacification of left hemithorax most likely due to a combination of effusion and atelectasis. Sternotomy wires are noted. Stable right basilar opacity is noted concerning for edema or atelectasis. No pneumothorax is noted. IMPRESSION: Stable complete opacification of left hemithorax most likely due to combination of effusion and atelectasis. Stable right basilar opacity concerning for edema or atelectasis. Electronically Signed   By: Marijo Conception M.D.   On: 03/20/2020 14:41   CT CHEST WO CONTRAST  Addendum Date: 03/23/2020    ADDENDUM REPORT: 03/23/2020 18:53 ADDENDUM: Transcription error: Impression point #4 should read: Dense mitral annular calcifications, could consider nonemergent echocardiography for evaluation of valve/cardiac function. Electronically Signed  By: Lovena Le M.D.   On: 03/23/2020 18:53   Result Date: 03/23/2020 CLINICAL DATA:  Shortness of breath, history of CHF, COPD. Thoracentesis 03/21/2019 EXAM: CT CHEST WITHOUT CONTRAST TECHNIQUE: Multidetector CT imaging of the chest was performed following the standard protocol without IV contrast. COMPARISON:  Chest CT 05/19/2018 FINDINGS: Imaging quality is severely degraded due to patient body habitus resulting in extensive photon starvation which also exacerbates the streak artifact from patient's hardware Cardiovascular: Cardiomegaly with four-chamber cardiac enlargement. Dense calcification of mitral annulus. Three-vessel coronary artery atherosclerosis. Focal pericardial thickening towards the apical septum (4/99). Atherosclerotic plaque within the normal caliber aorta. No periaortic stranding or hemorrhage. Normal 3 vessel branching of the aortic arch. Proximal great vessels are calcified but otherwise of grossly unremarkable. Central pulmonary artery enlargement, nonspecific but similar to prior and possibly reflecting some chronic pulmonary hypertension. Luminal evaluation precluded in the absence of contrast media. Pulmonary vascularity within the lung parenchyma is cephalized and redistributed. Luminal evaluation of the vasculature precluded in the absence of contrast. Mediastinum/Nodes: Mild edematous changes in the mediastinum. Few enlarged and borderline enlarged low-attenuation mediastinal nodes are present including a 14 mm subcarinal node (2/71). No axillary adenopathy. Hilar nodal evaluation is severely limited in the absence intravenous contrast media. Postsurgical changes in the anterior mediastinum likely related prior sternotomy and CABG.  Endotracheal tube terminates low within the trachea, 1.7 cm from the carina. Consider retracting at least 2 cm to the mid trachea. Transesophageal tube is in place. Tip terminates below the level of imaging with but with the side port appropriately positioned distal to the GE junction. Thyroid gland and thoracic inlet are unremarkable. Lungs/Pleura: Moderate to large bilateral pleural effusions, right greater than left with essentially complete atelectatic collapse of both lower lobes, subsegmental collapse of the right middle lobe and lingula as well. Underlying consolidation is not excluded particularly given areas of mixed consolidation and ground-glass within the aerated portions of the upper lungs. Some mild interlobular septal thickening is noted as well. No pneumothorax. Upper Abdomen: Small amount of subdiaphragmatic ascites. Questionable stranding centered upon the pancreatic tail. Upper abdominal atherosclerosis. Musculoskeletal: Appearance of extensive bridging syndesmophytes as well as fusion across the spinous processes with some exaggerated thoracic kyphosis. Could correlate for features ankylosing spondylitis. No acute fracture or suspicious osseous lesions. Prior sternotomy as well. Lower thoracolumbar fusion hardware is noted including vertebral augmentation changes at the T11 and 12 levels. Additional degenerative changes in both shoulders. Moderate body wall edema. IMPRESSION: 1. Imaging quality is severely degraded due to patient body habitus resulting in extensive photon starvation which also exacerbates the streak artifact from patient's hardware. 2. Moderate to large bilateral pleural effusions, right greater than left, with essentially complete atelectatic collapse of both lower lobes, subsegmental collapse of the right middle lobe and lingula as well. Underlying consolidation in the regions of volume loss is not excluded with evidence of additional areas of consolidation and ground-glass  within the aerated portions of the upper lungs concerning for an acute infectious/inflammatory process including atypical viral etiologies. 3. Additional features throughout the chest suggesting CHF/volume overload with pulmonary edema, cardiomegaly, ascites and body wall edema. 4. Dense mitral annular calcifications, could consider nonemergent echocardiography for evaluation of valvulae op a 3. 5. Focal pericardial thickening towards the apical septum. Nonspecific appearance. Could correlate for features of pericarditis or ischemic change. 6. Central pulmonary artery enlargement, nonspecific and incompletely evaluated in the absence of contrast media but similar to prior and possibly reflecting some chronic pulmonary hypertension.  7. Small amount of subdiaphragmatic ascites. 8. Questionable stranding centered upon the pancreatic tail. Possibly redistributed ascites though should correlate with lipase to exclude pancreatitis. 9. Appearance of extensive bridging syndesmophytes as well as fusion across the spinous processes with some exaggerated thoracic kyphosis. Such imaging features can be seen in the setting of ankylosing spondylitis. 10. Aortic Atherosclerosis (ICD10-I70.0). Electronically Signed: By: Lovena Le M.D. On: 03/21/2020 18:16   [place] CHEST (PLEURAL EFFUSION)  Result Date: 03/20/2020 CLINICAL DATA:  Dyspnea. EXAM: CHEST ULTRASOUND - LIMITED COMPARISON:  Chest radiograph 03/20/2020 FINDINGS: Left chest sonography demonstrates a moderate left pleural effusion. We were unable to sit the patient up to perform thoracentesis. The patient was very stiff and even with several caregivers holding the patient, we were not able to hold him in a steady position at the site of the bed adequate to perform thoracentesis. There is too great of a risk of him falling or sliding during thoracentesis due to his rigidity in the hips and legs. IMPRESSION: 1. At least moderate left pleural effusion is visualized  sonographically. Thoracentesis was not attempted as the patient could not be positioned adequately. Electronically Signed   By: Van Clines M.D.   On: 03/20/2020 15:00   DG CHEST PORT 1 VIEW  Result Date: 03/23/2020 CLINICAL DATA:  OG tube placement EXAM: PORTABLE CHEST 1 VIEW COMPARISON:  Radiograph 03/23/2020 FINDINGS: Endotracheal tube tip in the mid to lower trachea, 2.7 cm from the carina. Transesophageal tube tip terminates below the level of imaging with the side port beyond the GE junction. Telemetry leads overlie the chest. Postsurgical changes from prior sternotomy. Persistent layering bilateral effusions with hazy opacities in the lung bases likely reflecting combination of airspace disease, atelectasis and superimposed edema. IMPRESSION: 1. Endotracheal tube tip in the mid to lower trachea, 2.7 cm from the carina. 2. Transesophageal tube tip terminates below the level of imaging with the side port beyond the GE junction. 3. Persistent layering effusions. 4. Residual airspace opacities could reflect edema, atelectasis or airspace disease or combination there of. Electronically Signed   By: Lovena Le M.D.   On: 03/23/2020 18:54   DG Chest Port 1 View  Result Date: 03/23/2020 CLINICAL DATA:  Respiratory failure. EXAM: PORTABLE CHEST 1 VIEW COMPARISON:  03/21/2020 FINDINGS: The endotracheal tube and NG tubes are stable. Stable mild cardiac enlargement. Significant interval improved aeration of the left lung probable resolution of mucous plugging. Persistent left basilar atelectasis and small bilateral effusions. IMPRESSION: Stable support apparatus. Significant interval improved aeration of the left lung with probable resolution of mucous plugging. Electronically Signed   By: Marijo Sanes M.D.   On: 03/23/2020 07:21   DG CHEST PORT 1 VIEW  Result Date: 03/21/2020 CLINICAL DATA:  Hypoxia EXAM: PORTABLE CHEST 1 VIEW COMPARISON:  March 21, 2020 study obtained earlier in the day  FINDINGS: Endotracheal tube tip is 4.5 cm above the carina. Nasogastric tube tip is in the proximal stomach with the side port near the gastroesophageal junction, not well seen. No pneumothorax. There is extensive pleural effusion on the left with essentially complete opacification of the left hemithorax. There may well be underlying atelectasis and infiltrate on the left. On the right, there is apparent loculated effusion in the lateral right base. Right lung otherwise clear. Heart is prominent, stable, with evidence of previous median sternotomy. There is aortic atherosclerosis. Pulmonary vascularity on the right appears unremarkable. Pulmonary vascularity on the left is obscured fluid. No adenopathy evident in areas that can be  assessed for potential adenopathy. Postoperative change noted in the lower thoracic and lumbar regions. IMPRESSION: Tube and catheter positions as described without pneumothorax. It may be prudent to consider advancing endotracheal tube 4-5 cm to insure that both tube tip and side port are well within the stomach. Extensive opacification on the left with essentially complete opacification of the left hemithorax, likely due to combination of fluid and atelectasis. Superimposed pneumonia on the left cannot be excluded. Probable loculated pleural effusion lateral right base. Right lung otherwise clear. Stable cardiac silhouette. Aortic Atherosclerosis (ICD10-I70.0). Electronically Signed   By: Lowella Grip III M.D.   On: 03/21/2020 15:23   DG CHEST PORT 1 VIEW  Result Date: 03/21/2020 CLINICAL DATA:  Shortness of breath, history CHF, COPD, atrial fibrillation, obesity, was COVID-19 negative on 03/16/2020, post thoracentesis 03/20/2020 EXAM: PORTABLE CHEST 1 VIEW COMPARISON:  Portable exam 1342 hours compared to 03/20/2020 FINDINGS: Rotated to the LEFT. Upper normal size of cardiac silhouette post median sternotomy. Pulmonary vascular congestion. Bibasilar effusions and atelectasis,  significantly decreased on LEFT since previous exam. Minimal pulmonary edema question. No pneumothorax. Bones demineralized with prior thoracolumbar fusion. IMPRESSION: Bibasilar pleural effusions and atelectasis, decreased on LEFT since prior exam. Question minimal pulmonary edema. Electronically Signed   By: Lavonia Dana M.D.   On: 03/21/2020 14:02   DG CHEST PORT 1 VIEW  Result Date: 03/19/2020 CLINICAL DATA:  Worsening dyspnea today, COPD, diabetes mellitus, hypertension, negative COVID-19 test on 03/16/2020 EXAM: PORTABLE CHEST 1 VIEW COMPARISON:  Portable exam 1603 hours compared to 03/16/2020 FINDINGS: Opacification of LEFT hemithorax by pleural effusion and atelectasis increased from prior study. LEFT heart border obscured. Patchy infiltrates mid to lower RIGHT lung with minimal basilar atelectasis and probable small pleural effusion. No pneumothorax. Bones demineralized. IMPRESSION: Patchy RIGHT lung infiltrates with basilar pleural effusion and atelectasis. Opacified LEFT hemithorax by combination of atelectasis and pleural effusion, underlying infiltrate not excluded. Electronically Signed   By: Lavonia Dana M.D.   On: 03/19/2020 16:21   DG Chest Port 1 View  Result Date: 03/16/2020 CLINICAL DATA:  Concern for sepsis. EXAM: PORTABLE CHEST 1 VIEW COMPARISON:  06/17/2019 chest radiograph and prior. FINDINGS: Left basilar/retrocardiac patchy opacities. No pneumothorax. Small left pleural effusion. Cardiomegaly and prominence of the central pulmonary vessels. Post sternotomy sequela. IMPRESSION: Cardiomegaly and central pulmonary vascular congestion. Left basilar opacities. Differential includes infection, edema and atelectasis. Electronically Signed   By: Primitivo Gauze M.D.   On: 03/16/2020 08:14   ECHOCARDIOGRAM COMPLETE  Result Date: 03/18/2020    ECHOCARDIOGRAM REPORT   Patient Name:   MOUHAMADOU GITTLEMAN Date of Exam: 03/18/2020 Medical Rec #:  [id number]    Height:       69.0 in Accession #:     [id number]   Weight:       240.0 lb Date of Birth:  [birth year]    BSA:          2.232 m Patient Age:    64 years     BP:           110/68 mmHg Patient Gender: M            HR:           67 bpm. Exam Location:  [facility] Procedure: 2D Echo and Intracardiac Opacification Agent Indications:    Bacteremia R78.81  History:        Patient has prior history of Echocardiogram examinations, most  recent 05/20/2019. CHF, COPD, Arrythmias:Atrial Fibrillation;                 Risk Factors:Hypertension and Diabetes.                  Mitral Valve: 29 mm St. Jude tissue valve valve is present in                 the mitral position. Procedure Date: 09/07/2012.  Sonographer:    Mikki Santee RDCS (AE) Referring Phys: [id number] COURAGE EMOKPAE IMPRESSIONS  1. Left ventricular ejection fraction, by estimation, is 35 to 40%. The left ventricle has moderately decreased function. The left ventricle demonstrates global hypokinesis. There is mild concentric left ventricular hypertrophy. Left ventricular diastolic parameters are indeterminate.  2. Right ventricular systolic function is normal. The right ventricular size is normal. There is moderately elevated pulmonary artery systolic pressure. The estimated right ventricular systolic pressure is 02.7 mmHg.  3. Left atrial size was severely dilated.  4. The mitral valve has been repaired/replaced. No evidence of mitral valve regurgitation. Moderate to severe mitral stenosis. The mean mitral valve gradient is 11.7 mmHg at 89bpm. There is a 29 mm St. Jude tissue valve present in the mitral position. Procedure Date: 09/07/2012. Echo findings are consistent with normal structure and function of the mitral valve prosthesis.  5. The aortic valve is tricuspid. Aortic valve regurgitation is not visualized. Mild aortic valve sclerosis is present, with no evidence of aortic valve stenosis.  6. The inferior vena cava is dilated in size with <50% respiratory variability, suggesting right  atrial pressure of 15 mmHg.  7. Compared to prior study 05/2018, the mean MV gradient of the MV tissue valve has increased from 8 to 110mHg. FINDINGS  Left Ventricle: Left ventricular ejection fraction, by estimation, is 35 to 40%. The left ventricle has moderately decreased function. The left ventricle demonstrates global hypokinesis. Definity contrast agent was given IV to delineate the left ventricular endocardial borders. The left ventricular internal cavity size was normal in size. There is mild concentric left ventricular hypertrophy. Left ventricular diastolic parameters are indeterminate. Right Ventricle: The right ventricular size is normal. No increase in right ventricular wall thickness. Right ventricular systolic function is normal. There is moderately elevated pulmonary artery systolic pressure. The tricuspid regurgitant velocity is 3.16 m/s, and with an assumed right atrial pressure of 15 mmHg, the estimated right ventricular systolic pressure is 525.3mmHg. Left Atrium: Left atrial size was severely dilated. Right Atrium: Right atrial size was normal in size. Pericardium: There is no evidence of pericardial effusion. Mitral Valve: The mitral valve has been repaired/replaced. No evidence of mitral valve regurgitation. There is a 29 mm St. Jude tissue valve present in the mitral position. Procedure Date: 09/07/2012. Echo findings are consistent with normal structure and function of the mitral valve prosthesis. Moderate to severe mitral valve stenosis. MV peak gradient, 28.7 mmHg. The mean mitral valve gradient is 11.7 mmHg. Tricuspid Valve: The tricuspid valve is normal in structure. Tricuspid valve regurgitation is mild . No evidence of tricuspid stenosis. Aortic Valve: The aortic valve is tricuspid. Aortic valve regurgitation is not visualized. Mild aortic valve sclerosis is present, with no evidence of aortic valve stenosis. Pulmonic Valve: The pulmonic valve was normal in structure. Pulmonic valve  regurgitation is trivial. No evidence of pulmonic stenosis. Aorta: The aortic root is normal in size and structure. Venous: The inferior vena cava is dilated in size with less than 50% respiratory variability, suggesting right atrial  pressure of 15 mmHg. IAS/Shunts: No atrial level shunt detected by color flow Doppler.  LEFT VENTRICLE PLAX 2D LVIDd:         5.42 cm LVIDs:         4.46 cm LV PW:         1.22 cm LV IVS:        1.30 cm LVOT diam:     2.50 cm LV SV:         59 LV SV Index:   26 LVOT Area:     4.91 cm  RIGHT VENTRICLE TAPSE (M-mode): 1.0 cm LEFT ATRIUM              Index       RIGHT ATRIUM           Index LA diam:        5.10 cm  2.28 cm/m  RA Area:     20.30 cm LA Vol (A2C):   121.0 ml 54.20 ml/m RA Volume:   59.20 ml  26.52 ml/m LA Vol (A4C):   114.0 ml 51.06 ml/m LA Biplane Vol: 123.0 ml 55.10 ml/m  AORTIC VALVE LVOT Vmax:   72.10 cm/s LVOT Vmean:  48.067 cm/s LVOT VTI:    0.120 m  AORTA Ao Root diam: 3.60 cm MITRAL VALVE              TRICUSPID VALVE MV Area (PHT): 2.16 cm   TR Peak grad:   39.9 mmHg MV Peak grad:  28.7 mmHg  TR Vmax:        316.00 cm/s MV Mean grad:  11.7 mmHg MV Vmax:       2.68 m/s   SHUNTS MV Vmean:      159.3 cm/s Systemic VTI:  0.12 m                           Systemic Diam: 2.50 cm Fransico Him MD Electronically signed by Fransico Him MD Signature Date/Time: 03/18/2020/4:27:42 PM    Final      CBC Recent Labs  Lab 03/21/20 0554 03/22/20 0332 03/23/20 0307 03/25/20 0412  WBC 5.9 6.2 7.3 4.4  HGB 11.8* 10.7* 10.5* 10.4*  HCT 40.4 36.1* 34.8* 35.6*  PLT 146* 139* 155 159  MCV 98.3 96.3 94.3 97.8  MCH 28.7 28.5 28.5 28.6  MCHC 29.2* 29.6* 30.2 29.2*  RDW 16.1* 16.1* 16.4* 16.4*    Chemistries  Recent Labs  Lab 03/21/20 0554 03/21/20 1521 03/21/20 1840 03/22/20 0332 03/22/20 1647 03/23/20 0307 03/24/20 0536 03/25/20 0412  NA 144  --   --  145  --  145 145 146*  K 5.0  --   --  4.2  --  3.6 3.2* 3.6  CL 106  --   --  108  --  105 104 105  CO2  32  --   --  30  --  32 34* 35*  GLUCOSE 145*  --   --  121*  --  182* 150* 123*  BUN 31*  --   --  35*  --  35* 31* 21  CREATININE 0.62  --   --  0.62  --  0.59* 0.55* 0.47*  CALCIUM 9.0  --   --  8.5*  --  8.1* 7.8* 8.1*  MG  --  2.3 2.3 2.3 2.0  --   --   --   AST 18  --   --  26  --   --   --   --   ALT 17  --   --  21  --   --   --   --   ALKPHOS 73  --   --  73  --   --   --   --   BILITOT 0.8  --   --  0.6  --   --   --   --     Lab Results  Component Value Date   HGBA1C 5.6 03/01/2020   Coagulation profile No results for input(s): INR, PROTIME in the last 168 hours.  ------------------------------------------------------------------------------------------------------------------    Component Value Date/Time   BNP 368.9 (H) 06/03/2019 2149   CRITICAL CARE Performed by: Barton Dubois   Total critical care time: 45 minutes  Critical care time was exclusive of separately billable procedures and treating other patients.  Critical care was necessary to treat or prevent imminent or life-threatening deterioration.  Critical care was time spent personally by me on the following activities: development of treatment plan with patient and/or surrogate as well as nursing, discussions with consultants, evaluation of patient's response to treatment, examination of patient, obtaining history from patient or surrogate, ordering and performing treatments and interventions, ordering and review of laboratory studies, ordering and review of radiographic studies, pulse oximetry and re-evaluation of patient's condition.   Barton Dubois MD on 03/26/2020 at 10:52 AM  Go to [email or web address] - for contact info  [facility] - Office  [phone]

## 2020-03-27 LAB — CBC
HCT: 34.7 % — ABNORMAL LOW (ref 39.0–52.0)
Hemoglobin: 10.6 g/dL — ABNORMAL LOW (ref 13.0–17.0)
MCH: 28.6 pg (ref 26.0–34.0)
MCHC: 30.5 g/dL (ref 30.0–36.0)
MCV: 93.5 fL (ref 80.0–100.0)
Platelets: 165 10*3/uL (ref 150–400)
RBC: 3.71 MIL/uL — ABNORMAL LOW (ref 4.22–5.81)
RDW: 15.9 % — ABNORMAL HIGH (ref 11.5–15.5)
WBC: 4.7 10*3/uL (ref 4.0–10.5)
nRBC: 0 % (ref 0.0–0.2)

## 2020-03-27 LAB — COMPREHENSIVE METABOLIC PANEL
ALT: 14 U/L (ref 0–44)
AST: 20 U/L (ref 15–41)
Albumin: 2.2 g/dL — ABNORMAL LOW (ref 3.5–5.0)
Alkaline Phosphatase: 64 U/L (ref 38–126)
Anion gap: 10 (ref 5–15)
BUN: 21 mg/dL (ref 8–23)
CO2: 30 mmol/L (ref 22–32)
Calcium: 8.3 mg/dL — ABNORMAL LOW (ref 8.9–10.3)
Chloride: 105 mmol/L (ref 98–111)
Creatinine, Ser: 0.54 mg/dL — ABNORMAL LOW (ref 0.61–1.24)
GFR calc Af Amer: >60 mL/min (ref >60)
GFR calc non Af Amer: >60 mL/min (ref >60)
Glucose, Bld: 102 mg/dL — ABNORMAL HIGH (ref 70–99)
Potassium: 3.2 mmol/L — ABNORMAL LOW (ref 3.5–5.1)
Sodium: 145 mmol/L (ref 135–145)
Total Bilirubin: 1 mg/dL (ref 0.3–1.2)
Total Protein: 5.6 g/dL — ABNORMAL LOW (ref 6.5–8.1)

## 2020-03-27 LAB — GLUCOSE, CAPILLARY
Glucose-Capillary: 102 mg/dL — ABNORMAL HIGH (ref 70–99)
Glucose-Capillary: 105 mg/dL — ABNORMAL HIGH (ref 70–99)
Glucose-Capillary: 109 mg/dL — ABNORMAL HIGH (ref 70–99)
Glucose-Capillary: 117 mg/dL — ABNORMAL HIGH (ref 70–99)
Glucose-Capillary: 119 mg/dL — ABNORMAL HIGH (ref 70–99)
Glucose-Capillary: 139 mg/dL — ABNORMAL HIGH (ref 70–99)

## 2020-03-27 MED ORDER — ORAL CARE MOUTH RINSE
15.0000 mL | OROMUCOSAL | Status: DC
  Administered 2020-03-27 – 2020-03-30 (×28): 15 mL via OROMUCOSAL

## 2020-03-27 MED ORDER — METHYLPREDNISOLONE SODIUM SUCC 125 MG IJ SOLR: 80.0000 mg | Freq: Two times a day (BID) | INTRAMUSCULAR | Status: DC

## 2020-03-27 MED ORDER — POTASSIUM CHLORIDE 20 MEQ/15ML (10%) PO SOLN
20.0000 meq | Freq: Every day | ORAL | Status: DC
  Administered 2020-03-27 – 2020-03-30 (×4): 20 meq via ORAL
  Filled 2020-03-27 (×5): qty 30

## 2020-03-27 MED ORDER — CHLORHEXIDINE GLUCONATE 0.12% ORAL RINSE (MEDLINE KIT)
15.0000 mL | Freq: Two times a day (BID) | OROMUCOSAL | Status: DC
  Administered 2020-03-27 – 2020-03-30 (×7): 15 mL via OROMUCOSAL

## 2020-03-27 MED ORDER — MIDAZOLAM BOLUS VIA INFUSION
1.0000 mg | INTRAVENOUS | Status: DC | PRN
  Administered 2020-03-27 (×2): 2 mg via INTRAVENOUS
  Filled 2020-03-27: qty 2

## 2020-03-27 MED ORDER — VITAL HIGH PROTEIN PO LIQD
1000.0000 mL | ORAL | Status: DC
  Administered 2020-03-27: 1000 mL

## 2020-03-27 MED ORDER — PROSOURCE TF PO LIQD
45.0000 mL | Freq: Two times a day (BID) | ORAL | Status: DC
  Administered 2020-03-27 – 2020-03-30 (×6): 45 mL
  Filled 2020-03-27 (×6): qty 45

## 2020-03-27 MED ORDER — MIDAZOLAM 50MG/50ML (1MG/ML) PREMIX INFUSION
0.0000 mg/h | INTRAVENOUS | Status: DC
  Administered 2020-03-27: 2 mg/h via INTRAVENOUS
  Administered 2020-03-27 – 2020-03-28 (×3): 4 mg/h via INTRAVENOUS
  Administered 2020-03-29 – 2020-03-30 (×2): 3 mg/h via INTRAVENOUS
  Filled 2020-03-27 (×6): qty 50

## 2020-03-27 MED ORDER — DOCUSATE SODIUM 50 MG/5ML PO LIQD: 100.0000 mg | Freq: Two times a day (BID) | ORAL | Status: DC

## 2020-03-27 MED ORDER — POLYETHYLENE GLYCOL 3350 17 G PO PACK
17.0000 g | PACK | Freq: Every day | ORAL | Status: DC
  Filled 2020-03-27: qty 1

## 2020-03-27 NOTE — Progress Notes (Signed)
OT Cancellation Note  Patient Details Name: Kellan Raffield MRN: [id number] DOB: 1950-10-30   Cancelled Treatment:    Reason Eval/Treat Not Completed: Medical issues which prohibited therapy.  Patient re-intubated and unable to participate in OT evaluation at this time. Will continue to monitor patient's status and evaluate when medically able to participate.    Limmie Patricia, OTR/L,CBIS  [phone]  03/27/2020, 8:35 AM

## 2020-03-27 NOTE — Progress Notes (Signed)
Patient Demographics:    Cody Hale, is a 69 y.o. male, DOB - Sep 08, 1950, JJO:[id number]  Admit date - 03/16/2020   Admitting Physician Courage Denton Brick, MD  Outpatient Primary MD for the patient is [name], MD  LOS - 11  Chief Complaint  Patient presents with  . Fever        Subjective:    Cody Hale afebrile, no CP, increase work of breathing, productive coughing spells/desaturation. Unable to protect airways and ended requiring to be intubated and mechanically ventilated.    Assessment  & Plan :    Principal Problem:   Sepsis due to pneumonia Active Problems:   OSA (obstructive sleep apnea)   Essential hypertension, malignant   Type II diabetes mellitus with manifestations (HCC)   Chronic systolic CHF (congestive heart failure) (HCC)   Goals of care, counseling/discussion   COPD (chronic obstructive pulmonary disease) with chronic bronchitis (HCC)   Chronic atrial fibrillation   S/P mitral valve replacement with bioprosthetic valve   Major depressive disorder, recurrent severe without psychotic features ([place])   Chronic respiratory failure with hypoxia ([place])   Community acquired pneumonia   Streptococcus G Bacteremia   Endotracheally intubated   Palliative care by specialist  Brief Summary:- 69 y.o. 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, chronic atrial fibrillation with chronic anticoagulation, COPD, diabetes mellitus , h/o T11/L4 fusion after lumbar fracture 11/17/20withWound dehiscence noted12/7/20, foot wound Prior history of dysphagia requiring PEG tube placement 06/04/2019 chronic hypoxic respiratory failure admitted on 03/17/2019 from [facility] with acute on chronic hypoxic respiratory failure secondary to community-acquired pneumonia -Prior H/o Bacterial  Endocarditis --s/p Mitral Valve Repair Previously Now has Strep group G Bacteremia---Needs TEE  A/p 1)Community-acquired Pneumonia---  --In the ED chest x-ray was suggestive of pulmonary venous congestion Versus pneumonia -However given fevers, leukocytosis cough and dyspnea and hypoxia, favor pneumonia -On IV Rocephin and azithromycin initially; has now completed zithromax therapy and remains on rocephin as per ID rec's. -Repeat chest x-ray yesterday showed stable complete opacification of the left hemithorax likely combination of effusion and atelectasis. -aspiration and bilateral pleural effusion also playing a role in his difficulty breathing. -re-intubated due to increase work of breathing, confusion, desaturation and inability to protect airways. -Aspiration/mucus plugging as main culprit.   2)Strep Group G Sepsis --patient had leukocytosis, tachypnea, tachycardia and fevers --- as well as confusion and hypoxia -- suspect secondary to. -UA not suggestive of UTI -Patient also has right foot wound and sacral wounds do not look particularly infected--please see photos in epic --Lactic acid is not elevated -Continue Rocephin blood cultures from 03/16/20 with strep group G,--patient with prior history of endocarditis repeat TTE from 03/18/2020 noted  repeat blood cultures on 03/19/2020 no growth so far. -WBC within normal limits and no longer febrile. -Prior H/o Bacterial Endocarditis --s/p Mitral Valve Repair Previously Now has Strep group G Bacteremia- -If TEE is negative patient may be discharged home on p.o. Keflex to complete 2 weeks of antibiotic therapy for bacteremia (2 weeks from first negative culture) --Phone consult with Dr. Bobby Rumpf from infectious disease -TEE still pending at this time.  3) chronic anemia----hemoglobin currently above 11  which is higher than recent baseline, -No bleeding concerns at this time -Continue to follow hemoglobin trend.  4) chronic atrial  fibrillation    -on anticoagulation with Xarelto -Rate controlled overall.  5)Bioprosthetic Mitral Valve Replacement after Endocarditis in 2014--repeat echo from 03/18/20 showed---mitral valve has been repaired/replaced. No evidence of mitral valve regurgitation. Moderate to severe mitral stenosis. The mean mitral valve gradient is 11.7 mmHg at 89bpm. There is a 29 mm St. Jude tissue valve present in the mitral position.  --Cardiology consult for possible TEE requested due to strep group G bacteremia -Unable to do TEE secondary to his respiratory status. -Continue current IV antibiotics.  6)DM2-  -Continue sliding scale insulin -Follow CBGs and adjust hypoglycemic regimen as required.   7) decubitus ulcers--mostly on the buttocks and right foot----wound care consult appreciated, -Continue to follow wound care recommendations and preventive measures -pressure injuries were present on admission.   8) acute on chronic hypoxic respiratory failure--- secondary to #1 above, with aspiration pneumonitis and concern for mucous plugging  -Currently intubated and mechanically ventilated; still with increased FiO2 and ventilatory support.  Not ready for extubation.  Significant rhonchi appreciated on examination. -Successfully extubated 03/24/20; unfortunately developed episode of increase work of breathing, desaturation, confusion and inability to protect airways on 03/1920, patient was re-intubated. I believed main cause was aspiration/mucus plugging. -continue ventilatory support, will most likely bronchoscopy; follow pulmonologist rec's.  -Continue scheduled bronchodilators, continue IV Lasix to further assist drying lungs -Continue mucolytics  9-BPH -continue flomax  Status is: Inpatient   Dispo: The patient is from: ALF  Anticipated d/c is to:  To be determined; but possible will require skilled nursing facility at discharge.  Anticipated d/c date is: Unknown at this  time.               Patient currently is not medically stable to d/c.  Barriers: Not Clinically Stable-pneumonia/sepsis strep group G bacteremia requiring IV antibiotics.  Also having acute on chronic hypoxemic respiratory failure, requiring intubation and mechanical ventilatory support. He was extubated on 03-24-20 and re-intubated on 03/26/20. Continue current IV antibiotics, follow clinical response and recommendations by pulmonologist.  Code Status : full  Family Communication: daughter was updated by phone and all questions answered (03/26/20).  Consults  :  Phone consult with ID and Cardiologist DVT Prophylaxis  :  Xarelto- SCDs   Lab Results  Component Value Date   PLT 165 03/27/2020    Inpatient Medications  Scheduled Meds: . ascorbic acid  500 mg Per Tube Daily  . chlorhexidine gluconate (MEDLINE KIT)  15 mL Mouth Rinse BID  . Chlorhexidine Gluconate Cloth  6 each Topical Daily  . docusate  100 mg Oral BID  . feeding supplement (PROSource TF)  45 mL Per Tube BID  . ferrous sulfate  300 mg Per Tube BID WC  . free water  100 mL Per Tube Q8H  . furosemide  20 mg Intravenous Daily  . Gerhardt's butt cream   Topical QID  . guaiFENesin  600 mg Oral BID  . influenza vaccine adjuvanted  0.5 mL Intramuscular Tomorrow-1000  . insulin aspart  0-20 Units Subcutaneous Q4H  . ipratropium-albuterol  3 mL Nebulization Q6H  . LORazepam  1 mg Intravenous Once  . mouth rinse  15 mL Mouth Rinse 10 times per day  . metoCLOPramide  5 mg Per Tube Q8H  . metoprolol tartrate  5 mg Intravenous Q8H  . multivitamin with minerals  1 tablet Per Tube Daily  .  pantoprazole sodium  40 mg Per Tube Q24H  . polyethylene glycol  17 g Oral Daily  . rivaroxaban  20 mg Per Tube Q supper  . rosuvastatin  5 mg Per Tube Daily  . sodium chloride flush  3 mL Intravenous Q12H  . tamsulosin  0.4 mg Oral QPC breakfast  . Vilazodone HCl  40 mg Oral Daily   Continuous Infusions: . sodium chloride     . cefTRIAXone (ROCEPHIN)  IV Stopped (03/27/20 0801)  . feeding supplement (VITAL HIGH PROTEIN) 1,000 mL (03/27/20 1247)  . midazolam 4 mg/hr (03/27/20 1413)   PRN Meds:.sodium chloride, acetaminophen, albuterol, fentaNYL (SUBLIMAZE) injection, fentaNYL (SUBLIMAZE) injection, midazolam, midazolam, midazolam, [DISCONTINUED] ondansetron **OR** ondansetron (ZOFRAN) IV, polyethylene glycol, sodium chloride flush    Anti-infectives (From admission, onward)   Start     Dose/Rate Route Frequency Ordered Stop   03/17/20 0830  cefTRIAXone (ROCEPHIN) 2 g in sodium chloride 0.9 % 100 mL IVPB        2 g 200 mL/hr over 30 Minutes Intravenous Every 24 hours 03/17/20 0813     03/16/20 1945  cefTRIAXone (ROCEPHIN) 1 g in sodium chloride 0.9 % 100 mL IVPB  Status:  Discontinued        1 g 200 mL/hr over 30 Minutes Intravenous Every 24 hours 03/16/20 1933 03/17/20 0813   03/16/20 1945  azithromycin (ZITHROMAX) 500 mg in sodium chloride 0.9 % 250 mL IVPB  Status:  Discontinued        500 mg 250 mL/hr over 60 Minutes Intravenous Every 24 hours 03/16/20 1933 03/21/20 1206   03/16/20 1115  cefTRIAXone (ROCEPHIN) 1 g in sodium chloride 0.9 % 100 mL IVPB        1 g 200 mL/hr over 30 Minutes Intravenous  Once 03/16/20 1103 03/16/20 1210   03/16/20 1115  azithromycin (ZITHROMAX) 500 mg in sodium chloride 0.9 % 250 mL IVPB        500 mg 250 mL/hr over 60 Minutes Intravenous  Once 03/16/20 1103 03/16/20 1326        Objective:   Vitals:   03/27/20 1300 03/27/20 1400 03/27/20 1500 03/27/20 1559  BP: (!) 127/51 (!) 143/48    Pulse:      Resp: _0 Temp:    99.9 F (37.7 C)  TempSrc:    Axillary  SpO2: 94% 94% 95% 94%  Height:        Wt Readings from Last 3 Encounters:  03/01/20 110.7 kg  02/17/20 102.1 kg  02/10/20 108.9 kg     Intake/Output Summary (Last 24 hours) at 03/27/2020 1716 Last data filed at 03/27/2020 1413 Gross per 24 hour  Intake 419.2 ml  Output 1000 ml  Net -580.8 ml     Physical Exam General exam: Afebrile, sedated, intubated on mechanical ventilator.  While receiving sedation holiday able to answer simple questions with yes or no movement from his head.  Ventilatory support parameters with an FiO2 of 65%; rate of 20, volume 570 PEEP of 5. Respiratory system: Diffuse rhonchi, no expiratory wheezing.  Decreased breath sounds at the bases. Cardiovascular system: Soft systolic murmur appreciated.  Irregular rhythm, no rubs, no gallops, no murmurs.  No JVD on exam. Gastrointestinal system: Abdomen is nondistended, soft and nontender. No organomegaly or masses felt. Normal bowel sounds heard. Central nervous system: Unable to properly assess due to current sedation. Extremities: No cyanosis or clubbing.  Trace edema appreciated bilaterally. Skin: No rashes, no petechiae; patient with right  foot pressure injuries and also pressure injury in his decubitus area.  Both present on admission.  No signs of superimposed infection. Psychiatry: Unable to properly assess due to current sedation.     Data Review:   Micro Results Recent Results (from the past 240 hour(s))  Culture, blood (Routine X 2) w Reflex to ID Panel     Status: None   Collection Time: 03/19/20  8:44 AM   Specimen: Right Antecubital; Blood  Result Value Ref Range Status   Specimen Description   Final    RIGHT ANTECUBITAL BOTTLES DRAWN AEROBIC AND ANAEROBIC   Special Requests Blood Culture adequate volume  Final   Culture   Final    NO GROWTH 5 DAYS Performed at [facility], [address] [place], [place] [address]    Report Status 03/24/2020 FINAL  Final  Culture, blood (Routine X 2) w Reflex to ID Panel     Status: None   Collection Time: 03/19/20  8:44 AM   Specimen: BLOOD RIGHT HAND  Result Value Ref Range Status   Specimen Description   Final    BLOOD RIGHT HAND BOTTLES DRAWN AEROBIC AND ANAEROBIC   Special Requests Blood Culture adequate volume  Final   Culture   Final    NO GROWTH  5 DAYS Performed at [facility], [address] [place], [place] [address]    Report Status 03/24/2020 FINAL  Final  MRSA PCR Screening     Status: None   Collection Time: 03/21/20  1:20 PM   Specimen: Nasopharyngeal  Result Value Ref Range Status   MRSA by PCR NEGATIVE NEGATIVE Final    Comment:        The GeneXpert MRSA Assay (FDA approved for NASAL specimens only), is one component of a comprehensive MRSA colonization surveillance program. It is not intended to diagnose MRSA infection nor to guide or monitor treatment for MRSA infections. Performed at [facility], [address] [place], [place] [address]   Culture, respiratory     Status: None   Collection Time: 03/21/20  3:10 PM   Specimen: Bronchoalveolar Lavage; Respiratory  Result Value Ref Range Status   Specimen Description   Final    BRONCHIAL ALVEOLAR LAVAGE Performed at [facility], [address] [place], [place] [address]    Special Requests   Final    NONE Performed at [facility], [name], [place], [place] [address]    Gram Stain NO WBC SEEN NO ORGANISMS SEEN   Final   Culture   Final    Normal respiratory flora-no Staph aureus or Pseudomonas seen Performed at [facility] [address], [place], [place] [address]    Report Status 03/24/2020 FINAL  Final    Radiology Reports DG Chest 2 View  Result Date: 03/20/2020 CLINICAL DATA:  Shortness of breath. EXAM: CHEST - 2 VIEW COMPARISON:  March 19, 2020. FINDINGS: There is stable complete opacification of left hemithorax most likely due to a combination of effusion and atelectasis. Sternotomy wires are noted. Stable right basilar opacity is noted concerning for edema or atelectasis. No pneumothorax is noted. IMPRESSION: Stable complete opacification of left hemithorax most likely due to combination of effusion and atelectasis. Stable right basilar opacity concerning for edema or atelectasis. Electronically Signed   By: Marijo Conception  M.D.   On: 03/20/2020 14:41   CT CHEST WO CONTRAST  Addendum Date: 03/23/2020   ADDENDUM REPORT: 03/23/2020 18:53 ADDENDUM: Transcription error: Impression point #4 should  read: Dense mitral annular calcifications, could consider nonemergent echocardiography for evaluation of valve/cardiac function. Electronically Signed   By: Lovena Le M.D.   On: 03/23/2020 18:53   Result Date: 03/23/2020 CLINICAL DATA:  Shortness of breath, history of CHF, COPD. Thoracentesis 03/21/2019 EXAM: CT CHEST WITHOUT CONTRAST TECHNIQUE: Multidetector CT imaging of the chest was performed following the standard protocol without IV contrast. COMPARISON:  Chest CT 05/19/2018 FINDINGS: Imaging quality is severely degraded due to patient body habitus resulting in extensive photon starvation which also exacerbates the streak artifact from patient's hardware Cardiovascular: Cardiomegaly with four-chamber cardiac enlargement. Dense calcification of mitral annulus. Three-vessel coronary artery atherosclerosis. Focal pericardial thickening towards the apical septum (4/99). Atherosclerotic plaque within the normal caliber aorta. No periaortic stranding or hemorrhage. Normal 3 vessel branching of the aortic arch. Proximal great vessels are calcified but otherwise of grossly unremarkable. Central pulmonary artery enlargement, nonspecific but similar to prior and possibly reflecting some chronic pulmonary hypertension. Luminal evaluation precluded in the absence of contrast media. Pulmonary vascularity within the lung parenchyma is cephalized and redistributed. Luminal evaluation of the vasculature precluded in the absence of contrast. Mediastinum/Nodes: Mild edematous changes in the mediastinum. Few enlarged and borderline enlarged low-attenuation mediastinal nodes are present including a 14 mm subcarinal node (2/71). No axillary adenopathy. Hilar nodal evaluation is severely limited in the absence intravenous contrast media. Postsurgical  changes in the anterior mediastinum likely related prior sternotomy and CABG. Endotracheal tube terminates low within the trachea, 1.7 cm from the carina. Consider retracting at least 2 cm to the mid trachea. Transesophageal tube is in place. Tip terminates below the level of imaging with but with the side port appropriately positioned distal to the GE junction. Thyroid gland and thoracic inlet are unremarkable. Lungs/Pleura: Moderate to large bilateral pleural effusions, right greater than left with essentially complete atelectatic collapse of both lower lobes, subsegmental collapse of the right middle lobe and lingula as well. Underlying consolidation is not excluded particularly given areas of mixed consolidation and ground-glass within the aerated portions of the upper lungs. Some mild interlobular septal thickening is noted as well. No pneumothorax. Upper Abdomen: Small amount of subdiaphragmatic ascites. Questionable stranding centered upon the pancreatic tail. Upper abdominal atherosclerosis. Musculoskeletal: Appearance of extensive bridging syndesmophytes as well as fusion across the spinous processes with some exaggerated thoracic kyphosis. Could correlate for features ankylosing spondylitis. No acute fracture or suspicious osseous lesions. Prior sternotomy as well. Lower thoracolumbar fusion hardware is noted including vertebral augmentation changes at the T11 and 12 levels. Additional degenerative changes in both shoulders. Moderate body wall edema. IMPRESSION: 1. Imaging quality is severely degraded due to patient body habitus resulting in extensive photon starvation which also exacerbates the streak artifact from patient's hardware. 2. Moderate to large bilateral pleural effusions, right greater than left, with essentially complete atelectatic collapse of both lower lobes, subsegmental collapse of the right middle lobe and lingula as well. Underlying consolidation in the regions of volume loss is not  excluded with evidence of additional areas of consolidation and ground-glass within the aerated portions of the upper lungs concerning for an acute infectious/inflammatory process including atypical viral etiologies. 3. Additional features throughout the chest suggesting CHF/volume overload with pulmonary edema, cardiomegaly, ascites and body wall edema. 4. Dense mitral annular calcifications, could consider nonemergent echocardiography for evaluation of valvulae op a 3. 5. Focal pericardial thickening towards the apical septum. Nonspecific appearance. Could correlate for features of pericarditis or ischemic change. 6. Central pulmonary artery enlargement, nonspecific and incompletely  evaluated in the absence of contrast media but similar to prior and possibly reflecting some chronic pulmonary hypertension. 7. Small amount of subdiaphragmatic ascites. 8. Questionable stranding centered upon the pancreatic tail. Possibly redistributed ascites though should correlate with lipase to exclude pancreatitis. 9. Appearance of extensive bridging syndesmophytes as well as fusion across the spinous processes with some exaggerated thoracic kyphosis. Such imaging features can be seen in the setting of ankylosing spondylitis. 10. Aortic Atherosclerosis (ICD10-I70.0). Electronically Signed: By: Lovena Le M.D. On: 03/21/2020 18:16   [place] CHEST (PLEURAL EFFUSION)  Result Date: 03/20/2020 CLINICAL DATA:  Dyspnea. EXAM: CHEST ULTRASOUND - LIMITED COMPARISON:  Chest radiograph 03/20/2020 FINDINGS: Left chest sonography demonstrates a moderate left pleural effusion. We were unable to sit the patient up to perform thoracentesis. The patient was very stiff and even with several caregivers holding the patient, we were not able to hold him in a steady position at the site of the bed adequate to perform thoracentesis. There is too great of a risk of him falling or sliding during thoracentesis due to his rigidity in the hips and legs.  IMPRESSION: 1. At least moderate left pleural effusion is visualized sonographically. Thoracentesis was not attempted as the patient could not be positioned adequately. Electronically Signed   By: Van Clines M.D.   On: 03/20/2020 15:00   DG CHEST PORT 1 VIEW  Result Date: 03/26/2020 CLINICAL DATA:  Status post intubation.  COPD.  Former smoker. EXAM: PORTABLE CHEST 1 VIEW COMPARISON:  03/21/2020 FINDINGS: ET tube tip is above the carina. The NG tube tip is below the GE junction. Previous median sternotomy. Stable cardiac enlargement. Moderate to large bilateral pleural effusions are again noted and appear increased from previous exam. Persistent bibasilar atelectasis and airspace consolidation. IMPRESSION: 1. Increase in volume of bilateral pleural effusions. 2. Persistent bibasilar atelectasis and airspace consolidation. Electronically Signed   By: Kerby Moors M.D.   On: 03/26/2020 11:09   DG CHEST PORT 1 VIEW  Result Date: 03/23/2020 CLINICAL DATA:  OG tube placement EXAM: PORTABLE CHEST 1 VIEW COMPARISON:  Radiograph 03/23/2020 FINDINGS: Endotracheal tube tip in the mid to lower trachea, 2.7 cm from the carina. Transesophageal tube tip terminates below the level of imaging with the side port beyond the GE junction. Telemetry leads overlie the chest. Postsurgical changes from prior sternotomy. Persistent layering bilateral effusions with hazy opacities in the lung bases likely reflecting combination of airspace disease, atelectasis and superimposed edema. IMPRESSION: 1. Endotracheal tube tip in the mid to lower trachea, 2.7 cm from the carina. 2. Transesophageal tube tip terminates below the level of imaging with the side port beyond the GE junction. 3. Persistent layering effusions. 4. Residual airspace opacities could reflect edema, atelectasis or airspace disease or combination there of. Electronically Signed   By: Lovena Le M.D.   On: 03/23/2020 18:54   DG Chest Port 1 View  Result  Date: 03/23/2020 CLINICAL DATA:  Respiratory failure. EXAM: PORTABLE CHEST 1 VIEW COMPARISON:  03/21/2020 FINDINGS: The endotracheal tube and NG tubes are stable. Stable mild cardiac enlargement. Significant interval improved aeration of the left lung probable resolution of mucous plugging. Persistent left basilar atelectasis and small bilateral effusions. IMPRESSION: Stable support apparatus. Significant interval improved aeration of the left lung with probable resolution of mucous plugging. Electronically Signed   By: Marijo Sanes M.D.   On: 03/23/2020 07:21   DG CHEST PORT 1 VIEW  Result Date: 03/21/2020 CLINICAL DATA:  Hypoxia EXAM: PORTABLE CHEST 1 VIEW COMPARISON:  March 21, 2020 study obtained earlier in the day FINDINGS: Endotracheal tube tip is 4.5 cm above the carina. Nasogastric tube tip is in the proximal stomach with the side port near the gastroesophageal junction, not well seen. No pneumothorax. There is extensive pleural effusion on the left with essentially complete opacification of the left hemithorax. There may well be underlying atelectasis and infiltrate on the left. On the right, there is apparent loculated effusion in the lateral right base. Right lung otherwise clear. Heart is prominent, stable, with evidence of previous median sternotomy. There is aortic atherosclerosis. Pulmonary vascularity on the right appears unremarkable. Pulmonary vascularity on the left is obscured fluid. No adenopathy evident in areas that can be assessed for potential adenopathy. Postoperative change noted in the lower thoracic and lumbar regions. IMPRESSION: Tube and catheter positions as described without pneumothorax. It may be prudent to consider advancing endotracheal tube 4-5 cm to insure that both tube tip and side port are well within the stomach. Extensive opacification on the left with essentially complete opacification of the left hemithorax, likely due to combination of fluid and atelectasis.  Superimposed pneumonia on the left cannot be excluded. Probable loculated pleural effusion lateral right base. Right lung otherwise clear. Stable cardiac silhouette. Aortic Atherosclerosis (ICD10-I70.0). Electronically Signed   By: Lowella Grip III M.D.   On: 03/21/2020 15:23   DG CHEST PORT 1 VIEW  Result Date: 03/21/2020 CLINICAL DATA:  Shortness of breath, history CHF, COPD, atrial fibrillation, obesity, was COVID-19 negative on 03/16/2020, post thoracentesis 03/20/2020 EXAM: PORTABLE CHEST 1 VIEW COMPARISON:  Portable exam 1342 hours compared to 03/20/2020 FINDINGS: Rotated to the LEFT. Upper normal size of cardiac silhouette post median sternotomy. Pulmonary vascular congestion. Bibasilar effusions and atelectasis, significantly decreased on LEFT since previous exam. Minimal pulmonary edema question. No pneumothorax. Bones demineralized with prior thoracolumbar fusion. IMPRESSION: Bibasilar pleural effusions and atelectasis, decreased on LEFT since prior exam. Question minimal pulmonary edema. Electronically Signed   By: Lavonia Dana M.D.   On: 03/21/2020 14:02   DG CHEST PORT 1 VIEW  Result Date: 03/19/2020 CLINICAL DATA:  Worsening dyspnea today, COPD, diabetes mellitus, hypertension, negative COVID-19 test on 03/16/2020 EXAM: PORTABLE CHEST 1 VIEW COMPARISON:  Portable exam 1603 hours compared to 03/16/2020 FINDINGS: Opacification of LEFT hemithorax by pleural effusion and atelectasis increased from prior study. LEFT heart border obscured. Patchy infiltrates mid to lower RIGHT lung with minimal basilar atelectasis and probable small pleural effusion. No pneumothorax. Bones demineralized. IMPRESSION: Patchy RIGHT lung infiltrates with basilar pleural effusion and atelectasis. Opacified LEFT hemithorax by combination of atelectasis and pleural effusion, underlying infiltrate not excluded. Electronically Signed   By: Lavonia Dana M.D.   On: 03/19/2020 16:21   DG Chest Port 1 View  Result Date:  03/16/2020 CLINICAL DATA:  Concern for sepsis. EXAM: PORTABLE CHEST 1 VIEW COMPARISON:  06/17/2019 chest radiograph and prior. FINDINGS: Left basilar/retrocardiac patchy opacities. No pneumothorax. Small left pleural effusion. Cardiomegaly and prominence of the central pulmonary vessels. Post sternotomy sequela. IMPRESSION: Cardiomegaly and central pulmonary vascular congestion. Left basilar opacities. Differential includes infection, edema and atelectasis. Electronically Signed   By: Primitivo Gauze M.D.   On: 03/16/2020 08:14   ECHOCARDIOGRAM COMPLETE  Result Date: 03/18/2020    ECHOCARDIOGRAM REPORT   Patient Name:   Cody Hale Date of Exam: 03/18/2020 Medical Rec #:  [id number]    Height:       69.0 in Accession #:    [id number]   Weight:  240.0 lb Date of Birth:  1950/11/03    BSA:          2.232 m Patient Age:    75 years     BP:           110/68 mmHg Patient Gender: M            HR:           67 bpm. Exam Location:  [facility] Procedure: 2D Echo and Intracardiac Opacification Agent Indications:    Bacteremia R78.81  History:        Patient has prior history of Echocardiogram examinations, most                 recent 05/20/2019. CHF, COPD, Arrythmias:Atrial Fibrillation;                 Risk Factors:Hypertension and Diabetes.                  Mitral Valve: 29 mm St. Jude tissue valve valve is present in                 the mitral position. Procedure Date: 09/07/2012.  Sonographer:    Mikki Santee RDCS (AE) Referring Phys: [id number] COURAGE EMOKPAE IMPRESSIONS  1. Left ventricular ejection fraction, by estimation, is 35 to 40%. The left ventricle has moderately decreased function. The left ventricle demonstrates global hypokinesis. There is mild concentric left ventricular hypertrophy. Left ventricular diastolic parameters are indeterminate.  2. Right ventricular systolic function is normal. The right ventricular size is normal. There is moderately elevated pulmonary artery systolic pressure. The  estimated right ventricular systolic pressure is 75.1 mmHg.  3. Left atrial size was severely dilated.  4. The mitral valve has been repaired/replaced. No evidence of mitral valve regurgitation. Moderate to severe mitral stenosis. The mean mitral valve gradient is 11.7 mmHg at 89bpm. There is a 29 mm St. Jude tissue valve present in the mitral position. Procedure Date: 09/07/2012. Echo findings are consistent with normal structure and function of the mitral valve prosthesis.  5. The aortic valve is tricuspid. Aortic valve regurgitation is not visualized. Mild aortic valve sclerosis is present, with no evidence of aortic valve stenosis.  6. The inferior vena cava is dilated in size with <50% respiratory variability, suggesting right atrial pressure of 15 mmHg.  7. Compared to prior study 05/2018, the mean MV gradient of the MV tissue valve has increased from 8 to 53mHg. FINDINGS  Left Ventricle: Left ventricular ejection fraction, by estimation, is 35 to 40%. The left ventricle has moderately decreased function. The left ventricle demonstrates global hypokinesis. Definity contrast agent was given IV to delineate the left ventricular endocardial borders. The left ventricular internal cavity size was normal in size. There is mild concentric left ventricular hypertrophy. Left ventricular diastolic parameters are indeterminate. Right Ventricle: The right ventricular size is normal. No increase in right ventricular wall thickness. Right ventricular systolic function is normal. There is moderately elevated pulmonary artery systolic pressure. The tricuspid regurgitant velocity is 3.16 m/s, and with an assumed right atrial pressure of 15 mmHg, the estimated right ventricular systolic pressure is 502.5mmHg. Left Atrium: Left atrial size was severely dilated. Right Atrium: Right atrial size was normal in size. Pericardium: There is no evidence of pericardial effusion. Mitral Valve: The mitral valve has been repaired/replaced.  No evidence of mitral valve regurgitation. There is a 29 mm St. Jude tissue valve present in the mitral position. Procedure Date: 09/07/2012. Echo findings  are consistent with normal structure and function of the mitral valve prosthesis. Moderate to severe mitral valve stenosis. MV peak gradient, 28.7 mmHg. The mean mitral valve gradient is 11.7 mmHg. Tricuspid Valve: The tricuspid valve is normal in structure. Tricuspid valve regurgitation is mild . No evidence of tricuspid stenosis. Aortic Valve: The aortic valve is tricuspid. Aortic valve regurgitation is not visualized. Mild aortic valve sclerosis is present, with no evidence of aortic valve stenosis. Pulmonic Valve: The pulmonic valve was normal in structure. Pulmonic valve regurgitation is trivial. No evidence of pulmonic stenosis. Aorta: The aortic root is normal in size and structure. Venous: The inferior vena cava is dilated in size with less than 50% respiratory variability, suggesting right atrial pressure of 15 mmHg. IAS/Shunts: No atrial level shunt detected by color flow Doppler.  LEFT VENTRICLE PLAX 2D LVIDd:         5.42 cm LVIDs:         4.46 cm LV PW:         1.22 cm LV IVS:        1.30 cm LVOT diam:     2.50 cm LV SV:         59 LV SV Index:   26 LVOT Area:     4.91 cm  RIGHT VENTRICLE TAPSE (M-mode): 1.0 cm LEFT ATRIUM              Index       RIGHT ATRIUM           Index LA diam:        5.10 cm  2.28 cm/m  RA Area:     20.30 cm LA Vol (A2C):   121.0 ml 54.20 ml/m RA Volume:   59.20 ml  26.52 ml/m LA Vol (A4C):   114.0 ml 51.06 ml/m LA Biplane Vol: 123.0 ml 55.10 ml/m  AORTIC VALVE LVOT Vmax:   72.10 cm/s LVOT Vmean:  48.067 cm/s LVOT VTI:    0.120 m  AORTA Ao Root diam: 3.60 cm MITRAL VALVE              TRICUSPID VALVE MV Area (PHT): 2.16 cm   TR Peak grad:   39.9 mmHg MV Peak grad:  28.7 mmHg  TR Vmax:        316.00 cm/s MV Mean grad:  11.7 mmHg MV Vmax:       2.68 m/s   SHUNTS MV Vmean:      159.3 cm/s Systemic VTI:  0.12 m                            Systemic Diam: 2.50 cm Fransico Him MD Electronically signed by Fransico Him MD Signature Date/Time: 03/18/2020/4:27:42 PM    Final      CBC Recent Labs  Lab 03/21/20 4315 03/22/20 0332 03/23/20 0307 03/25/20 0412 03/27/20 0559  WBC 5.9 6.2 7.3 4.4 4.7  HGB 11.8* 10.7* 10.5* 10.4* 10.6*  HCT 40.4 36.1* 34.8* 35.6* 34.7*  PLT 146* 139* 155 159 165  MCV 98.3 96.3 94.3 97.8 93.5  MCH 28.7 28.5 28.5 28.6 28.6  MCHC 29.2* 29.6* 30.2 29.2* 30.5  RDW 16.1* 16.1* 16.4* 16.4* 15.9*    Chemistries  Recent Labs  Lab 03/21/20 0554 03/21/20 0554 03/21/20 1521 03/21/20 1840 03/22/20 0332 03/22/20 1647 03/23/20 0307 03/24/20 0536 03/25/20 0412 03/27/20 0559  NA 144   < >  --   --  145  --  145 145 146* 145  K 5.0   < >  --   --  4.2  --  3.6 3.2* 3.6 3.2*  CL 106   < >  --   --  108  --  105 104 105 105  CO2 32   < >  --   --  30  --  32 34* 35* 30  GLUCOSE 145*   < >  --   --  121*  --  182* 150* 123* 102*  BUN 31*   < >  --   --  35*  --  35* 31* 21 21  CREATININE 0.62   < >  --   --  0.62  --  0.59* 0.55* 0.47* 0.54*  CALCIUM 9.0   < >  --   --  8.5*  --  8.1* 7.8* 8.1* 8.3*  MG  --   --  2.3 2.3 2.3 2.0  --   --   --   --   AST 18  --   --   --  26  --   --   --   --  20  ALT 17  --   --   --  21  --   --   --   --  14  ALKPHOS 73  --   --   --  73  --   --   --   --  64  BILITOT 0.8  --   --   --  0.6  --   --   --   --  1.0   < > = values in this interval not displayed.    Lab Results  Component Value Date   HGBA1C 5.6 03/01/2020   Coagulation profile No results for input(s): INR, PROTIME in the last 168 hours.  ------------------------------------------------------------------------------------------------------------------    Component Value Date/Time   BNP 368.9 (H) 06/03/2019 2149   CRITICAL CARE Performed by: Barton Dubois   Total critical care time: 40 minutes  Critical care time was exclusive of separately billable procedures and treating  other patients.  Critical care was necessary to treat or prevent imminent or life-threatening deterioration.  Critical care was time spent personally by me on the following activities: development of treatment plan with patient and/or surrogate as well as nursing, discussions with consultants, evaluation of patient's response to treatment, examination of patient, obtaining history from patient or surrogate, ordering and performing treatments and interventions, ordering and review of laboratory studies, ordering and review of radiographic studies, pulse oximetry and re-evaluation of patient's condition.   Barton Dubois MD on 03/27/2020 at 5:16 PM  Go to [email or web address] - for contact info  [facility] - Office  [phone]

## 2020-03-27 NOTE — TOC Progression Note (Addendum)
Transition of Care [facility]) - Progression Note    Patient Details  Name: Cody Hale MRN: [id number] Date of Birth: 07-25-1950  [facility] CM/SW Contact  Leitha Bleak, RN Phone Number: 03/27/2020, 12:45 PM  Clinical Narrative:    TOC spoke with Alcario Drought at [facility]. Over the weekend they had discuss patient. She will be able to make a bed offer and possible ready tomorrow. Updated her that patient had to be reintubated. TOC called family, left a message to update, Unsure if anyone has spoke with family about [facility].  Addendum: [facility] will take on 9/22, requesting [facility] be called and set up today. Lyman Bishop RN will get patient scheduled. TOC called [facility] to update them with discharge plan and left daughter another message.  Expected Discharge Plan: Long Term Acute Care (LTAC) Barriers to Discharge: Continued Medical Work up  Expected Discharge Plan and Services Expected Discharge Plan: Long Term Acute Care (LTAC)     Post Acute Care Choice: NA Living arrangements for the past 2 months: Assisted Living Facility                 DME Arranged: N/A DME Agency: NA       HH Arranged: PT, OT HH Agency: [facility]     Readmission Risk Interventions Readmission Risk Prevention Plan 03/17/2020  Transportation Screening Complete  Medication Review [facility]) Complete  HRI or Home Care Consult Complete  SW Recovery Care/Counseling Consult Complete  Palliative Care Screening Not Applicable  Skilled Nursing Facility Not Applicable  Some recent data might be hidden

## 2020-03-27 NOTE — Progress Notes (Signed)
SLP Cancellation Note  Patient Details Name: Cody Hale MRN: [id number] DOB: [birth year]   Cancelled treatment:       Reason Eval/Treat Not Completed: Medical issues which prohibited therapy;Patient not medically ready (Pt re-intubated. Please reconsult when appropriate.). Pt had PEG removed in May 2021.  Thank you,  Havery Moros, CCC-SLP [phone]    Oren Barella 03/27/2020, 9:04 AM

## 2020-03-27 NOTE — Progress Notes (Addendum)
Initial Nutrition Assessment  DOCUMENTATION CODES:   Obesity unspecified  INTERVENTION:  Resume Vital High Protien @ 40 ml/hr via OG tube. Advance 10 ml/hr q 6 hr to goal 60 ml/hr (1440 ml daily).   Add ProSource TF 45 ml BID per OG tube.  This regimen provides 1520 kcal , 148 grams protein, and 1204 ml free water   Free water 100 ml q 8 hr  NUTRITION DIAGNOSIS:   Inadequate oral intake related to inability to eat as evidenced by NPO status.  Onging  GOAL:   Provide needs based on ASPEN/SCCM guidelines   Progressing- tube feeding initiation  MONITOR:   Vent status, Labs, Weight trends, I & O's, TF tolerance, Diet advancement  REASON FOR ASSESSMENT:   Consult Enteral/tube feeding initiation and management  ASSESSMENT:   69 year old male with history of Afib, mitral valve replacement on Xarelto, COPD, DM2, CHF, Gastroparesis, OSA on CPAP nightly, dysphagia s/p PEG tube placement 06/04/19, chronic respiratory failure who presented from ALF and admitted with acute on chronic hypoxic respiratory failure secondary to CAP  Patient re-intubated yesterday on ventilator support. Tube feeds being resumed.  MV: 10.6  L/min Temp (24hrs), Avg:98.1 F (36.7 C), Min:97.3 F (36.3 C), Max:98.9 F (37.2 C)  Sedative: Versed   Medications reviewed and include: ascorbic acid, colace, ProSource 45 ml BID daily, ferrous sulfate, lasix, insulin, reglan, lopressor, MVI, Protonix, miralax  Labs: BMP Latest Ref Rng & Units 03/25/2020 03/24/2020 03/23/2020  Glucose 70 - 99 mg/dL 616(W) 737(T) 062(I)  BUN 8 - 23 mg/dL 21 94(W) 54(O)  Creatinine 0.61 - 1.24 mg/dL 2.70(J) 5.00(X) 3.81(W)  BUN/Creat Ratio 6 - 22 (calc) - - -  Sodium 135 - 145 mmol/L 146(H) 145 145  Potassium 3.5 - 5.1 mmol/L 3.6 3.2(L) 3.6  Chloride 98 - 111 mmol/L 105 104 105  CO2 22 - 32 mmol/L 35(H) 34(H) 32  Calcium 8.9 - 10.3 mg/dL 8.1(L) 7.8(L) 8.1(L)     Intake/Output Summary (Last 24 hours) at 03/27/2020  2993 Last data filed at 03/27/2020 0500 Gross per 24 hour  Intake 200 ml  Output 1400 ml  Net -1200 ml     Diet Order:   Diet Order            Diet NPO time specified  Diet effective now                 EDUCATION NEEDS:   Not appropriate for education at this time  Skin:  Skin Assessment: Skin Integrity Issues: Skin Integrity Issues:: Diabetic Ulcer, Stage II Stage II: buttocks (05/08/19) Diabetic Ulcer: R;heel  Last BM:  9/18  Height:   Ht Readings from Last 1 Encounters:  03/27/20 5\' 9"  (1.753 m)    Weight:   Wt Readings from Last 1 Encounters:  03/01/20 110.7 kg  Admit wt.-254.5 kg (115.4 kg)   Ideal Body Weight:  72.7 kg  BMI:  Body mass index is 37.66 kg/m.  Estimated Nutritional Needs:   Kcal:  1332-1596 (12-14 kcal/kg)  Protein:  145 (2 g/kg/IBW)  Fluid:  > 1.3 L/day   03/03/20 MS,RD,CSG,LDN Pager: # Royann Shivers

## 2020-03-28 LAB — CBC
HCT: 34.1 % — ABNORMAL LOW (ref 39.0–52.0)
Hemoglobin: 10.4 g/dL — ABNORMAL LOW (ref 13.0–17.0)
MCH: 28.5 pg (ref 26.0–34.0)
MCHC: 30.5 g/dL (ref 30.0–36.0)
MCV: 93.4 fL (ref 80.0–100.0)
Platelets: 156 10*3/uL (ref 150–400)
RBC: 3.65 MIL/uL — ABNORMAL LOW (ref 4.22–5.81)
RDW: 16 % — ABNORMAL HIGH (ref 11.5–15.5)
WBC: 5 10*3/uL (ref 4.0–10.5)
nRBC: 0 % (ref 0.0–0.2)

## 2020-03-28 LAB — BASIC METABOLIC PANEL
Anion gap: 8 (ref 5–15)
BUN: 20 mg/dL (ref 8–23)
CO2: 29 mmol/L (ref 22–32)
Calcium: 8 mg/dL — ABNORMAL LOW (ref 8.9–10.3)
Chloride: 106 mmol/L (ref 98–111)
Creatinine, Ser: 0.56 mg/dL — ABNORMAL LOW (ref 0.61–1.24)
GFR calc Af Amer: >60 mL/min (ref >60)
GFR calc non Af Amer: >60 mL/min (ref >60)
Glucose, Bld: 136 mg/dL — ABNORMAL HIGH (ref 70–99)
Potassium: 3.1 mmol/L — ABNORMAL LOW (ref 3.5–5.1)
Sodium: 143 mmol/L (ref 135–145)

## 2020-03-28 LAB — GLUCOSE, CAPILLARY
Glucose-Capillary: 122 mg/dL — ABNORMAL HIGH (ref 70–99)
Glucose-Capillary: 124 mg/dL — ABNORMAL HIGH (ref 70–99)
Glucose-Capillary: 146 mg/dL — ABNORMAL HIGH (ref 70–99)
Glucose-Capillary: 147 mg/dL — ABNORMAL HIGH (ref 70–99)
Glucose-Capillary: 147 mg/dL — ABNORMAL HIGH (ref 70–99)
Glucose-Capillary: 164 mg/dL — ABNORMAL HIGH (ref 70–99)

## 2020-03-28 LAB — PHOSPHORUS: Phosphorus: 2.6 mg/dL (ref 2.5–4.6)

## 2020-03-28 LAB — MAGNESIUM: Magnesium: 2 mg/dL (ref 1.7–2.4)

## 2020-03-28 NOTE — Progress Notes (Signed)
Patient Demographics:    Cody Hale, is a 69 y.o. male, DOB - 02-Jan-1951, BJS:[id number]  Admit date - 03/16/2020   Admitting Physician Courage Denton Brick, MD  Outpatient Primary MD for the patient is [name], MD  LOS - 12  Chief Complaint  Patient presents with  . Fever        Subjective:    Cody Hale currently afebrile, no nausea or vomiting. Loose stools appreciated after initiation of the feedings. Still sedated, intubated and mechanically ventilated. Improvement in FiO2 (down to 50-45%) and ventilatory support appreciated.    Assessment  & Plan :    Principal Problem:   Sepsis due to pneumonia Active Problems:   OSA (obstructive sleep apnea)   Essential hypertension, malignant   Type II diabetes mellitus with manifestations (HCC)   Chronic systolic CHF (congestive heart failure) (HCC)   Goals of care, counseling/discussion   COPD (chronic obstructive pulmonary disease) with chronic bronchitis (HCC)   Chronic atrial fibrillation   S/P mitral valve replacement with bioprosthetic valve   Major depressive disorder, recurrent severe without psychotic features ([place])   Chronic respiratory failure with hypoxia ([place])   Community acquired pneumonia   Streptococcus G Bacteremia   Endotracheally intubated   Palliative care by specialist  Brief Summary:- 69 y.o. 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, chronic atrial fibrillation with chronic anticoagulation, COPD, diabetes mellitus , h/o T11/L4 fusion after lumbar fracture 11/17/20withWound dehiscence noted12/7/20, foot wound Prior history of dysphagia requiring PEG tube placement 06/04/2019 chronic hypoxic respiratory failure admitted on 03/17/2019 from [facility]F with acute on chronic hypoxic respiratory failure secondary  to community-acquired pneumonia -Prior H/o Bacterial Endocarditis --s/p Mitral Valve Repair Previously Now has Strep group G Bacteremia---Needs TEE  A/p 1)Community-acquired Pneumonia---  --In the ED chest x-ray was suggestive of pulmonary venous congestion Versus pneumonia -However given fevers, leukocytosis cough and dyspnea and hypoxia, favor pneumonia -On IV Rocephin and azithromycin initially; has now completed zithromax therapy and remains on rocephin as per ID rec's. -Repeat chest x-ray yesterday showed stable complete opacification of the left hemithorax likely combination of effusion and atelectasis. -aspiration and bilateral pleural effusion also playing a role in his difficulty breathing. -re-intubated due to increase work of breathing, confusion, desaturation and inability to protect airways. -Aspiration/mucus plugging as main culprit.   2)Strep Group G Sepsis --patient had leukocytosis, tachypnea, tachycardia and fevers --- as well as confusion and hypoxia -- suspect secondary to. -UA not suggestive of UTI -Patient also has right foot wound and sacral wounds do not look particularly infected--please see photos in epic --Lactic acid is not elevated -Continue Rocephin blood cultures from 03/16/20 with strep group G,--patient with prior history of endocarditis repeat TTE from 03/18/2020 noted  repeat blood cultures on 03/19/2020 no growth so far. -WBC within normal limits and no longer febrile. -Prior H/o Bacterial Endocarditis --s/p Mitral Valve Repair Previously Now has Strep group G Bacteremia- -If TEE is negative patient may be discharged home on p.o. Keflex to complete 2 weeks of antibiotic therapy for bacteremia (2 weeks from first negative culture) --Phone consult with Dr. Bobby Rumpf from infectious disease -TEE still pending at this time.  3) chronic anemia----hemoglobin currently above 11 which is higher than recent baseline, -No bleeding concerns at this  time -Continue to follow hemoglobin trend.  4) chronic atrial fibrillation    -on anticoagulation with Xarelto -Rate controlled overall.  5)Bioprosthetic Mitral Valve Replacement after Endocarditis in 2014--repeat echo from 03/18/20 showed---mitral valve has been repaired/replaced. No evidence of mitral valve regurgitation. Moderate to severe mitral stenosis. The mean mitral valve gradient is 11.7 mmHg at 89bpm. There is a 29 mm St. Jude tissue valve present in the mitral position.  --Cardiology consult for possible TEE requested due to strep group G bacteremia -Unable to do TEE secondary to his respiratory status. -Continue current IV antibiotics.  6)DM2-  -Continue sliding scale insulin -Follow CBGs and adjust hypoglycemic regimen as required.   7) decubitus ulcers--mostly on the buttocks and right foot----wound care consult appreciated, -Continue to follow wound care recommendations and preventive measures -pressure injuries were present on admission.   8) acute on chronic hypoxic respiratory failure--- secondary to #1 above, with aspiration pneumonitis and concern for mucous plugging  -Currently intubated and mechanically ventilated; still with increased FiO2 and ventilatory support.  Not ready for extubation.  Significant rhonchi appreciated on examination. -Successfully extubated 03/24/20; unfortunately developed episode of increase work of breathing, desaturation, confusion and inability to protect airways on 03/1920, patient was re-intubated. I believed main cause was aspiration/mucus plugging. -continue ventilatory support, will most likely bronchoscopy; follow pulmonologist rec's.  -Continue scheduled bronchodilators, continue IV Lasix to further assist drying lungs -Continue mucolytics  9-BPH -continue flomax  10-hypokalemia -Continue daily supplementation -Use of Lasix and GI losses from loose stools with tube feedings initiation as a culprit for abnormal  electrolytes. -Continue to follow trend.  Status is: Inpatient   Dispo: The patient is from: ALF  Anticipated d/c is to:  To be determined; but possible will require skilled nursing facility at discharge.  Anticipated d/c date is: Unknown at this time.               Patient currently is not medically stable to d/c.  Barriers: Not Clinically Stable-pneumonia/sepsis strep group G bacteremia requiring IV antibiotics.  Also having acute on chronic hypoxemic respiratory failure, requiring intubation and mechanical ventilatory support. He was extubated on 03-24-20 and re-intubated on 03/26/20. Continue current IV antibiotics, follow clinical response and recommendations by pulmonologist.  Code Status : full  Family Communication: daughter was updated by phone and all questions answered (03/26/20).  Consults  :  Phone consult with ID and Cardiologist DVT Prophylaxis  :  Xarelto- SCDs   Lab Results  Component Value Date   PLT 156 03/28/2020    Inpatient Medications  Scheduled Meds: . ascorbic acid  500 mg Per Tube Daily  . chlorhexidine gluconate (MEDLINE KIT)  15 mL Mouth Rinse BID  . Chlorhexidine Gluconate Cloth  6 each Topical Daily  . docusate  100 mg Oral BID  . feeding supplement (PROSource TF)  45 mL Per Tube BID  . ferrous sulfate  300 mg Per Tube BID WC  . free water  100 mL Per Tube Q8H  . furosemide  20 mg Intravenous Daily  . Gerhardt's butt cream   Topical QID  . guaiFENesin  600 mg Oral BID  . influenza vaccine adjuvanted  0.5 mL Intramuscular Tomorrow-1000  . insulin aspart  0-20 Units Subcutaneous Q4H  . ipratropium-albuterol  3 mL Nebulization Q6H  . LORazepam  1 mg Intravenous Once  . mouth rinse  15 mL Mouth Rinse 10  times per day  . metoCLOPramide  5 mg Per Tube Q8H  . metoprolol tartrate  5 mg Intravenous Q8H  . multivitamin with minerals  1 tablet Per Tube Daily  . pantoprazole sodium  40 mg Per Tube Q24H  .  polyethylene glycol  17 g Oral Daily  . potassium chloride  20 mEq Oral Daily  . rivaroxaban  20 mg Per Tube Q supper  . rosuvastatin  5 mg Per Tube Daily  . sodium chloride flush  3 mL Intravenous Q12H  . tamsulosin  0.4 mg Oral QPC breakfast  . Vilazodone HCl  40 mg Oral Daily   Continuous Infusions: . sodium chloride    . cefTRIAXone (ROCEPHIN)  IV 2 g (03/28/20 0905)  . feeding supplement (VITAL HIGH PROTEIN) 1,000 mL (03/27/20 1247)  . midazolam 4 mg/hr (03/28/20 0840)   PRN Meds:.sodium chloride, acetaminophen, albuterol, fentaNYL (SUBLIMAZE) injection, fentaNYL (SUBLIMAZE) injection, midazolam, midazolam, midazolam, [DISCONTINUED] ondansetron **OR** ondansetron (ZOFRAN) IV, polyethylene glycol, sodium chloride flush    Anti-infectives (From admission, onward)   Start     Dose/Rate Route Frequency Ordered Stop   03/17/20 0830  cefTRIAXone (ROCEPHIN) 2 g in sodium chloride 0.9 % 100 mL IVPB        2 g 200 mL/hr over 30 Minutes Intravenous Every 24 hours 03/17/20 0813     03/16/20 1945  cefTRIAXone (ROCEPHIN) 1 g in sodium chloride 0.9 % 100 mL IVPB  Status:  Discontinued        1 g 200 mL/hr over 30 Minutes Intravenous Every 24 hours 03/16/20 1933 03/17/20 0813   03/16/20 1945  azithromycin (ZITHROMAX) 500 mg in sodium chloride 0.9 % 250 mL IVPB  Status:  Discontinued        500 mg 250 mL/hr over 60 Minutes Intravenous Every 24 hours 03/16/20 1933 03/21/20 1206   03/16/20 1115  cefTRIAXone (ROCEPHIN) 1 g in sodium chloride 0.9 % 100 mL IVPB        1 g 200 mL/hr over 30 Minutes Intravenous  Once 03/16/20 1103 03/16/20 1210   03/16/20 1115  azithromycin (ZITHROMAX) 500 mg in sodium chloride 0.9 % 250 mL IVPB        500 mg 250 mL/hr over 60 Minutes Intravenous  Once 03/16/20 1103 03/16/20 1326        Objective:   Vitals:   03/28/20 0900 03/28/20 1000 03/28/20 1100 03/28/20 1118  BP: (!) 133/44 (!) 110/48 (!) 116/98   Pulse:      Resp: _0 Temp:    98.7 F (37.1  C)  TempSrc:    Axillary  SpO2: 93% 95% 95% 96%  Weight:      Height: _1  (1.753 m)       Wt Readings from Last 3 Encounters:  03/28/20 100.2 kg  03/01/20 110.7 kg  02/17/20 102.1 kg     Intake/Output Summary (Last 24 hours) at 03/28/2020 1341 Last data filed at 03/28/2020 1100 Gross per 24 hour  Intake 481.12 ml  Output 1900 ml  Net -1418.88 ml    Physical Exam General exam: Sedated, intubated and mechanically ventilated; improvement appreciated in his FiO2 now down to 50-45%; also overall improvement in rhonchorous sounds noted during lungs auscultation; good urine output reported. Having loose stools after two feedings initiated. Respiratory system: Improved air movement bilaterally; positive rhonchi right, no wheezing currently. No using accessory muscles of breathing above ventilatory rate. Cardiovascular system: Irregular rhythm, soft systolic murmur appreciated on exam. No  rubs, no gallops, no JVD. Gastrointestinal system: Abdomen is nondistended, soft and nontender. No organomegaly or masses felt. Normal bowel sounds heard. Central nervous system: Unable to properly assess secondary to sedation. Extremities: No cyanosis or clubbing; trace edema bilaterally. Skin: No petechiae; patient with right foot pressure injuries and also pressure injury in his decubitus area (both present on admission; no signs of superimposed infection). Psychiatry: Unable to properly assess secondary to sedation.    Data Review:   Micro Results Recent Results (from the past 240 hour(s))  Culture, blood (Routine X 2) w Reflex to ID Panel     Status: None   Collection Time: 03/19/20  8:44 AM   Specimen: Right Antecubital; Blood  Result Value Ref Range Status   Specimen Description   Final    RIGHT ANTECUBITAL BOTTLES DRAWN AEROBIC AND ANAEROBIC   Special Requests Blood Culture adequate volume  Final   Culture   Final    NO GROWTH 5 DAYS Performed at [facility], [address],  [place], [place] [address]    Report Status 03/24/2020 FINAL  Final  Culture, blood (Routine X 2) w Reflex to ID Panel     Status: None   Collection Time: 03/19/20  8:44 AM   Specimen: BLOOD RIGHT HAND  Result Value Ref Range Status   Specimen Description   Final    BLOOD RIGHT HAND BOTTLES DRAWN AEROBIC AND ANAEROBIC   Special Requests Blood Culture adequate volume  Final   Culture   Final    NO GROWTH 5 DAYS Performed at [facility], [address], [place], [place] [address]    Report Status 03/24/2020 FINAL  Final  MRSA PCR Screening     Status: None   Collection Time: 03/21/20  1:20 PM   Specimen: Nasopharyngeal  Result Value Ref Range Status   MRSA by PCR NEGATIVE NEGATIVE Final    Comment:        The GeneXpert MRSA Assay (FDA approved for NASAL specimens only), is one component of a comprehensive MRSA colonization surveillance program. It is not intended to diagnose MRSA infection nor to guide or monitor treatment for MRSA infections. Performed at [facility], [address] [place], [place] [address]   Culture, respiratory     Status: None   Collection Time: 03/21/20  3:10 PM   Specimen: Bronchoalveolar Lavage; Respiratory  Result Value Ref Range Status   Specimen Description   Final    BRONCHIAL ALVEOLAR LAVAGE Performed at [facility], [address] [place], [place] [address]    Special Requests   Final    NONE Performed at [facility], [address] [place], [place] [address]    Gram Stain NO WBC SEEN NO ORGANISMS SEEN   Final   Culture   Final    Normal respiratory flora-no Staph aureus or Pseudomonas seen Performed at [facility] [address] [place], [place] [address]    Report Status 03/24/2020 FINAL  Final    Radiology Reports DG Chest 2 View  Result Date: 03/20/2020 CLINICAL DATA:  Shortness of breath. EXAM: CHEST - 2 VIEW COMPARISON:  March 19, 2020. FINDINGS: There is stable complete opacification of left hemithorax most likely  due to a combination of effusion and atelectasis. Sternotomy wires are noted. Stable right basilar opacity is noted concerning for edema or atelectasis. No pneumothorax is noted. IMPRESSION: Stable complete opacification of left hemithorax most likely due to combination of effusion and atelectasis. Stable right basilar opacity concerning  for edema or atelectasis. Electronically Signed   By: Marijo Conception M.D.   On: 03/20/2020 14:41   CT CHEST WO CONTRAST  Addendum Date: 03/23/2020   ADDENDUM REPORT: 03/23/2020 18:53 ADDENDUM: Transcription error: Impression point #4 should read: Dense mitral annular calcifications, could consider nonemergent echocardiography for evaluation of valve/cardiac function. Electronically Signed   By: Lovena Le M.D.   On: 03/23/2020 18:53   Result Date: 03/23/2020 CLINICAL DATA:  Shortness of breath, history of CHF, COPD. Thoracentesis 03/21/2019 EXAM: CT CHEST WITHOUT CONTRAST TECHNIQUE: Multidetector CT imaging of the chest was performed following the standard protocol without IV contrast. COMPARISON:  Chest CT 05/19/2018 FINDINGS: Imaging quality is severely degraded due to patient body habitus resulting in extensive photon starvation which also exacerbates the streak artifact from patient's hardware Cardiovascular: Cardiomegaly with four-chamber cardiac enlargement. Dense calcification of mitral annulus. Three-vessel coronary artery atherosclerosis. Focal pericardial thickening towards the apical septum (4/99). Atherosclerotic plaque within the normal caliber aorta. No periaortic stranding or hemorrhage. Normal 3 vessel branching of the aortic arch. Proximal great vessels are calcified but otherwise of grossly unremarkable. Central pulmonary artery enlargement, nonspecific but similar to prior and possibly reflecting some chronic pulmonary hypertension. Luminal evaluation precluded in the absence of contrast media. Pulmonary vascularity within the lung parenchyma is  cephalized and redistributed. Luminal evaluation of the vasculature precluded in the absence of contrast. Mediastinum/Nodes: Mild edematous changes in the mediastinum. Few enlarged and borderline enlarged low-attenuation mediastinal nodes are present including a 14 mm subcarinal node (2/71). No axillary adenopathy. Hilar nodal evaluation is severely limited in the absence intravenous contrast media. Postsurgical changes in the anterior mediastinum likely related prior sternotomy and CABG. Endotracheal tube terminates low within the trachea, 1.7 cm from the carina. Consider retracting at least 2 cm to the mid trachea. Transesophageal tube is in place. Tip terminates below the level of imaging with but with the side port appropriately positioned distal to the GE junction. Thyroid gland and thoracic inlet are unremarkable. Lungs/Pleura: Moderate to large bilateral pleural effusions, right greater than left with essentially complete atelectatic collapse of both lower lobes, subsegmental collapse of the right middle lobe and lingula as well. Underlying consolidation is not excluded particularly given areas of mixed consolidation and ground-glass within the aerated portions of the upper lungs. Some mild interlobular septal thickening is noted as well. No pneumothorax. Upper Abdomen: Small amount of subdiaphragmatic ascites. Questionable stranding centered upon the pancreatic tail. Upper abdominal atherosclerosis. Musculoskeletal: Appearance of extensive bridging syndesmophytes as well as fusion across the spinous processes with some exaggerated thoracic kyphosis. Could correlate for features ankylosing spondylitis. No acute fracture or suspicious osseous lesions. Prior sternotomy as well. Lower thoracolumbar fusion hardware is noted including vertebral augmentation changes at the T11 and 12 levels. Additional degenerative changes in both shoulders. Moderate body wall edema. IMPRESSION: 1. Imaging quality is severely  degraded due to patient body habitus resulting in extensive photon starvation which also exacerbates the streak artifact from patient's hardware. 2. Moderate to large bilateral pleural effusions, right greater than left, with essentially complete atelectatic collapse of both lower lobes, subsegmental collapse of the right middle lobe and lingula as well. Underlying consolidation in the regions of volume loss is not excluded with evidence of additional areas of consolidation and ground-glass within the aerated portions of the upper lungs concerning for an acute infectious/inflammatory process including atypical viral etiologies. 3. Additional features throughout the chest suggesting CHF/volume overload with pulmonary edema, cardiomegaly, ascites and body wall edema. 4.  Dense mitral annular calcifications, could consider nonemergent echocardiography for evaluation of valvulae op a 3. 5. Focal pericardial thickening towards the apical septum. Nonspecific appearance. Could correlate for features of pericarditis or ischemic change. 6. Central pulmonary artery enlargement, nonspecific and incompletely evaluated in the absence of contrast media but similar to prior and possibly reflecting some chronic pulmonary hypertension. 7. Small amount of subdiaphragmatic ascites. 8. Questionable stranding centered upon the pancreatic tail. Possibly redistributed ascites though should correlate with lipase to exclude pancreatitis. 9. Appearance of extensive bridging syndesmophytes as well as fusion across the spinous processes with some exaggerated thoracic kyphosis. Such imaging features can be seen in the setting of ankylosing spondylitis. 10. Aortic Atherosclerosis (ICD10-I70.0). Electronically Signed: By: Lovena Le M.D. On: 03/21/2020 18:16   [place] CHEST (PLEURAL EFFUSION)  Result Date: 03/20/2020 CLINICAL DATA:  Dyspnea. EXAM: CHEST ULTRASOUND - LIMITED COMPARISON:  Chest radiograph 03/20/2020 FINDINGS: Left chest sonography  demonstrates a moderate left pleural effusion. We were unable to sit the patient up to perform thoracentesis. The patient was very stiff and even with several caregivers holding the patient, we were not able to hold him in a steady position at the site of the bed adequate to perform thoracentesis. There is too great of a risk of him falling or sliding during thoracentesis due to his rigidity in the hips and legs. IMPRESSION: 1. At least moderate left pleural effusion is visualized sonographically. Thoracentesis was not attempted as the patient could not be positioned adequately. Electronically Signed   By: Van Clines M.D.   On: 03/20/2020 15:00   DG CHEST PORT 1 VIEW  Result Date: 03/26/2020 CLINICAL DATA:  Status post intubation.  COPD.  Former smoker. EXAM: PORTABLE CHEST 1 VIEW COMPARISON:  03/21/2020 FINDINGS: ET tube tip is above the carina. The NG tube tip is below the GE junction. Previous median sternotomy. Stable cardiac enlargement. Moderate to large bilateral pleural effusions are again noted and appear increased from previous exam. Persistent bibasilar atelectasis and airspace consolidation. IMPRESSION: 1. Increase in volume of bilateral pleural effusions. 2. Persistent bibasilar atelectasis and airspace consolidation. Electronically Signed   By: Kerby Moors M.D.   On: 03/26/2020 11:09   DG CHEST PORT 1 VIEW  Result Date: 03/23/2020 CLINICAL DATA:  OG tube placement EXAM: PORTABLE CHEST 1 VIEW COMPARISON:  Radiograph 03/23/2020 FINDINGS: Endotracheal tube tip in the mid to lower trachea, 2.7 cm from the carina. Transesophageal tube tip terminates below the level of imaging with the side port beyond the GE junction. Telemetry leads overlie the chest. Postsurgical changes from prior sternotomy. Persistent layering bilateral effusions with hazy opacities in the lung bases likely reflecting combination of airspace disease, atelectasis and superimposed edema. IMPRESSION: 1. Endotracheal  tube tip in the mid to lower trachea, 2.7 cm from the carina. 2. Transesophageal tube tip terminates below the level of imaging with the side port beyond the GE junction. 3. Persistent layering effusions. 4. Residual airspace opacities could reflect edema, atelectasis or airspace disease or combination there of. Electronically Signed   By: Lovena Le M.D.   On: 03/23/2020 18:54   DG Chest Port 1 View  Result Date: 03/23/2020 CLINICAL DATA:  Respiratory failure. EXAM: PORTABLE CHEST 1 VIEW COMPARISON:  03/21/2020 FINDINGS: The endotracheal tube and NG tubes are stable. Stable mild cardiac enlargement. Significant interval improved aeration of the left lung probable resolution of mucous plugging. Persistent left basilar atelectasis and small bilateral effusions. IMPRESSION: Stable support apparatus. Significant interval improved aeration of the left  lung with probable resolution of mucous plugging. Electronically Signed   By: Marijo Sanes M.D.   On: 03/23/2020 07:21   DG CHEST PORT 1 VIEW  Result Date: 03/21/2020 CLINICAL DATA:  Hypoxia EXAM: PORTABLE CHEST 1 VIEW COMPARISON:  March 21, 2020 study obtained earlier in the day FINDINGS: Endotracheal tube tip is 4.5 cm above the carina. Nasogastric tube tip is in the proximal stomach with the side port near the gastroesophageal junction, not well seen. No pneumothorax. There is extensive pleural effusion on the left with essentially complete opacification of the left hemithorax. There may well be underlying atelectasis and infiltrate on the left. On the right, there is apparent loculated effusion in the lateral right base. Right lung otherwise clear. Heart is prominent, stable, with evidence of previous median sternotomy. There is aortic atherosclerosis. Pulmonary vascularity on the right appears unremarkable. Pulmonary vascularity on the left is obscured fluid. No adenopathy evident in areas that can be assessed for potential adenopathy. Postoperative  change noted in the lower thoracic and lumbar regions. IMPRESSION: Tube and catheter positions as described without pneumothorax. It may be prudent to consider advancing endotracheal tube 4-5 cm to insure that both tube tip and side port are well within the stomach. Extensive opacification on the left with essentially complete opacification of the left hemithorax, likely due to combination of fluid and atelectasis. Superimposed pneumonia on the left cannot be excluded. Probable loculated pleural effusion lateral right base. Right lung otherwise clear. Stable cardiac silhouette. Aortic Atherosclerosis (ICD10-I70.0). Electronically Signed   By: Lowella Grip III M.D.   On: 03/21/2020 15:23   DG CHEST PORT 1 VIEW  Result Date: 03/21/2020 CLINICAL DATA:  Shortness of breath, history CHF, COPD, atrial fibrillation, obesity, was COVID-19 negative on 03/16/2020, post thoracentesis 03/20/2020 EXAM: PORTABLE CHEST 1 VIEW COMPARISON:  Portable exam 1342 hours compared to 03/20/2020 FINDINGS: Rotated to the LEFT. Upper normal size of cardiac silhouette post median sternotomy. Pulmonary vascular congestion. Bibasilar effusions and atelectasis, significantly decreased on LEFT since previous exam. Minimal pulmonary edema question. No pneumothorax. Bones demineralized with prior thoracolumbar fusion. IMPRESSION: Bibasilar pleural effusions and atelectasis, decreased on LEFT since prior exam. Question minimal pulmonary edema. Electronically Signed   By: Lavonia Dana M.D.   On: 03/21/2020 14:02   DG CHEST PORT 1 VIEW  Result Date: 03/19/2020 CLINICAL DATA:  Worsening dyspnea today, COPD, diabetes mellitus, hypertension, negative COVID-19 test on 03/16/2020 EXAM: PORTABLE CHEST 1 VIEW COMPARISON:  Portable exam 1603 hours compared to 03/16/2020 FINDINGS: Opacification of LEFT hemithorax by pleural effusion and atelectasis increased from prior study. LEFT heart border obscured. Patchy infiltrates mid to lower RIGHT lung  with minimal basilar atelectasis and probable small pleural effusion. No pneumothorax. Bones demineralized. IMPRESSION: Patchy RIGHT lung infiltrates with basilar pleural effusion and atelectasis. Opacified LEFT hemithorax by combination of atelectasis and pleural effusion, underlying infiltrate not excluded. Electronically Signed   By: Lavonia Dana M.D.   On: 03/19/2020 16:21   DG Chest Port 1 View  Result Date: 03/16/2020 CLINICAL DATA:  Concern for sepsis. EXAM: PORTABLE CHEST 1 VIEW COMPARISON:  06/17/2019 chest radiograph and prior. FINDINGS: Left basilar/retrocardiac patchy opacities. No pneumothorax. Small left pleural effusion. Cardiomegaly and prominence of the central pulmonary vessels. Post sternotomy sequela. IMPRESSION: Cardiomegaly and central pulmonary vascular congestion. Left basilar opacities. Differential includes infection, edema and atelectasis. Electronically Signed   By: Primitivo Gauze M.D.   On: 03/16/2020 08:14   ECHOCARDIOGRAM COMPLETE  Result Date: 03/18/2020    ECHOCARDIOGRAM  REPORT   Patient Name:   EZREAL TURAY Date of Exam: 03/18/2020 Medical Rec #:  [id number]    Height:       69.0 in Accession #:    [id number]   Weight:       240.0 lb Date of Birth:  1951-01-08    BSA:          2.232 m Patient Age:    37 years     BP:           110/68 mmHg Patient Gender: M            HR:           67 bpm. Exam Location:  [facility] Procedure: 2D Echo and Intracardiac Opacification Agent Indications:    Bacteremia R78.81  History:        Patient has prior history of Echocardiogram examinations, most                 recent 05/20/2019. CHF, COPD, Arrythmias:Atrial Fibrillation;                 Risk Factors:Hypertension and Diabetes.                  Mitral Valve: 29 mm St. Jude tissue valve valve is present in                 the mitral position. Procedure Date: 09/07/2012.  Sonographer:    Mikki Santee RDCS (AE) Referring Phys: [id number] COURAGE EMOKPAE IMPRESSIONS  1. Left ventricular  ejection fraction, by estimation, is 35 to 40%. The left ventricle has moderately decreased function. The left ventricle demonstrates global hypokinesis. There is mild concentric left ventricular hypertrophy. Left ventricular diastolic parameters are indeterminate.  2. Right ventricular systolic function is normal. The right ventricular size is normal. There is moderately elevated pulmonary artery systolic pressure. The estimated right ventricular systolic pressure is 01.7 mmHg.  3. Left atrial size was severely dilated.  4. The mitral valve has been repaired/replaced. No evidence of mitral valve regurgitation. Moderate to severe mitral stenosis. The mean mitral valve gradient is 11.7 mmHg at 89bpm. There is a 29 mm St. Jude tissue valve present in the mitral position. Procedure Date: 09/07/2012. Echo findings are consistent with normal structure and function of the mitral valve prosthesis.  5. The aortic valve is tricuspid. Aortic valve regurgitation is not visualized. Mild aortic valve sclerosis is present, with no evidence of aortic valve stenosis.  6. The inferior vena cava is dilated in size with <50% respiratory variability, suggesting right atrial pressure of 15 mmHg.  7. Compared to prior study 05/2018, the mean MV gradient of the MV tissue valve has increased from 8 to 33mHg. FINDINGS  Left Ventricle: Left ventricular ejection fraction, by estimation, is 35 to 40%. The left ventricle has moderately decreased function. The left ventricle demonstrates global hypokinesis. Definity contrast agent was given IV to delineate the left ventricular endocardial borders. The left ventricular internal cavity size was normal in size. There is mild concentric left ventricular hypertrophy. Left ventricular diastolic parameters are indeterminate. Right Ventricle: The right ventricular size is normal. No increase in right ventricular wall thickness. Right ventricular systolic function is normal. There is moderately elevated  pulmonary artery systolic pressure. The tricuspid regurgitant velocity is 3.16 m/s, and with an assumed right atrial pressure of 15 mmHg, the estimated right ventricular systolic pressure is 579.3mmHg. Left Atrium: Left atrial size was severely dilated. Right Atrium: Right atrial size  was normal in size. Pericardium: There is no evidence of pericardial effusion. Mitral Valve: The mitral valve has been repaired/replaced. No evidence of mitral valve regurgitation. There is a 29 mm St. Jude tissue valve present in the mitral position. Procedure Date: 09/07/2012. Echo findings are consistent with normal structure and function of the mitral valve prosthesis. Moderate to severe mitral valve stenosis. MV peak gradient, 28.7 mmHg. The mean mitral valve gradient is 11.7 mmHg. Tricuspid Valve: The tricuspid valve is normal in structure. Tricuspid valve regurgitation is mild . No evidence of tricuspid stenosis. Aortic Valve: The aortic valve is tricuspid. Aortic valve regurgitation is not visualized. Mild aortic valve sclerosis is present, with no evidence of aortic valve stenosis. Pulmonic Valve: The pulmonic valve was normal in structure. Pulmonic valve regurgitation is trivial. No evidence of pulmonic stenosis. Aorta: The aortic root is normal in size and structure. Venous: The inferior vena cava is dilated in size with less than 50% respiratory variability, suggesting right atrial pressure of 15 mmHg. IAS/Shunts: No atrial level shunt detected by color flow Doppler.  LEFT VENTRICLE PLAX 2D LVIDd:         5.42 cm LVIDs:         4.46 cm LV PW:         1.22 cm LV IVS:        1.30 cm LVOT diam:     2.50 cm LV SV:         59 LV SV Index:   26 LVOT Area:     4.91 cm  RIGHT VENTRICLE TAPSE (M-mode): 1.0 cm LEFT ATRIUM              Index       RIGHT ATRIUM           Index LA diam:        5.10 cm  2.28 cm/m  RA Area:     20.30 cm LA Vol (A2C):   121.0 ml 54.20 ml/m RA Volume:   59.20 ml  26.52 ml/m LA Vol (A4C):   114.0 ml 51.06  ml/m LA Biplane Vol: 123.0 ml 55.10 ml/m  AORTIC VALVE LVOT Vmax:   72.10 cm/s LVOT Vmean:  48.067 cm/s LVOT VTI:    0.120 m  AORTA Ao Root diam: 3.60 cm MITRAL VALVE              TRICUSPID VALVE MV Area (PHT): 2.16 cm   TR Peak grad:   39.9 mmHg MV Peak grad:  28.7 mmHg  TR Vmax:        316.00 cm/s MV Mean grad:  11.7 mmHg MV Vmax:       2.68 m/s   SHUNTS MV Vmean:      159.3 cm/s Systemic VTI:  0.12 m                           Systemic Diam: 2.50 cm Fransico Him MD Electronically signed by Fransico Him MD Signature Date/Time: 03/18/2020/4:27:42 PM    Final      CBC Recent Labs  Lab 03/22/20 4680 03/23/20 0307 03/25/20 0412 03/27/20 0559 03/28/20 0325  WBC 6.2 7.3 4.4 4.7 5.0  HGB 10.7* 10.5* 10.4* 10.6* 10.4*  HCT 36.1* 34.8* 35.6* 34.7* 34.1*  PLT 139* 155 159 165 156  MCV 96.3 94.3 97.8 93.5 93.4  MCH 28.5 28.5 28.6 28.6 28.5  MCHC 29.6* 30.2 29.2* 30.5 30.5  RDW 16.1* 16.4* 16.4* 15.9* 16.0*  Chemistries  Recent Labs  Lab 03/21/20 1521 03/21/20 1840 03/22/20 0332 03/22/20 0332 03/22/20 1647 03/23/20 0307 03/24/20 0536 03/25/20 0412 03/27/20 0559 03/28/20 0325  NA  --   --  145   < >  --  145 145 146* 145 143  K  --   --  4.2   < >  --  3.6 3.2* 3.6 3.2* 3.1*  CL  --   --  108   < >  --  105 104 105 105 106  CO2  --   --  30   < >  --  32 34* 35* 30 29  GLUCOSE  --   --  121*   < >  --  182* 150* 123* 102* 136*  BUN  --   --  35*   < >  --  35* 31* _0 CREATININE  --   --  0.62   < >  --  0.59* 0.55* 0.47* 0.54* 0.56*  CALCIUM  --   --  8.5*   < >  --  8.1* 7.8* 8.1* 8.3* 8.0*  MG 2.3 2.3 2.3  --  2.0  --   --   --   --  2.0  AST  --   --  26  --   --   --   --   --  20  --   ALT  --   --  21  --   --   --   --   --  14  --   ALKPHOS  --   --  73  --   --   --   --   --  64  --   BILITOT  --   --  0.6  --   --   --   --   --  1.0  --    < > = values in this interval not displayed.    Lab Results  Component Value Date   HGBA1C 5.6 03/01/2020    Coagulation profile No results for input(s): INR, PROTIME in the last 168 hours.  ------------------------------------------------------------------------------------------------------------------    Component Value Date/Time   BNP 368.9 (H) 06/03/2019 2149   CRITICAL CARE Performed by: Barton Dubois   Total critical care time: 40 minutes  Critical care time was exclusive of separately billable procedures and treating other patients.  Critical care was necessary to treat or prevent imminent or life-threatening deterioration.  Critical care was time spent personally by me on the following activities: development of treatment plan with patient and/or surrogate as well as nursing, discussions with consultants, evaluation of patient's response to treatment, examination of patient, obtaining history from patient or surrogate, ordering and performing treatments and interventions, ordering and review of laboratory studies, ordering and review of radiographic studies, pulse oximetry and re-evaluation of patient's condition.    Barton Dubois MD on 03/28/2020 at 1:41 PM  Go to [email or web address] - for contact info  [facility] - Office  [phone]

## 2020-03-28 NOTE — Progress Notes (Signed)
OT Cancellation Note  Patient Details Name: Cody Hale MRN: [id number] DOB: 24-May-1951   Cancelled Treatment:    Reason Eval/Treat Not Completed: Medical issues which prohibited therapy;Patient not medically ready. Pt initially extubated on 03/24/20, however has been re-intubated on 03/26/20. Please reconsult when pt is medically appropriate for rehab services.    Ezra Sites, OTR/L  [phone] 03/28/2020, 7:14 AM

## 2020-03-28 NOTE — Progress Notes (Signed)
Flexi-seal inserted at 1500 for frequent loose stools and to prevent further skin breakdown. Patient tolerated the procedure well. Will continue to monitor.

## 2020-03-29 LAB — GLUCOSE, CAPILLARY
Glucose-Capillary: 127 mg/dL — ABNORMAL HIGH (ref 70–99)
Glucose-Capillary: 131 mg/dL — ABNORMAL HIGH (ref 70–99)
Glucose-Capillary: 136 mg/dL — ABNORMAL HIGH (ref 70–99)
Glucose-Capillary: 140 mg/dL — ABNORMAL HIGH (ref 70–99)
Glucose-Capillary: 155 mg/dL — ABNORMAL HIGH (ref 70–99)

## 2020-03-29 MED ORDER — FUROSEMIDE 10 MG/ML IJ SOLN: 20.0000 mg | Freq: Every day | INTRAMUSCULAR | 0 refills | Status: DC

## 2020-03-29 MED ORDER — OSMOLITE 1.5 CAL PO LIQD: 1000.0000 mL | ORAL | Status: DC

## 2020-03-29 MED ORDER — POTASSIUM CHLORIDE 20 MEQ/15ML (10%) PO SOLN: 20.0000 meq | Freq: Every day | ORAL | 0 refills | Status: AC

## 2020-03-29 MED ORDER — VITAL HIGH PROTEIN PO LIQD: 1000.0000 mL | ORAL | 1 refills | Status: AC

## 2020-03-29 MED ORDER — CEFTRIAXONE IV (FOR PTA / DISCHARGE USE ONLY): 2.0000 g | INTRAVENOUS | 0 refills | Status: AC

## 2020-03-29 MED ORDER — VILAZODONE HCL 20 MG PO TABS
40.0000 mg | ORAL_TABLET | Freq: Every day | ORAL | Status: DC
  Administered 2020-03-29 – 2020-03-30 (×2): 40 mg via ORAL
  Filled 2020-03-29 (×3): qty 2

## 2020-03-29 MED ORDER — OSMOLITE 1.5 CAL PO LIQD
1000.0000 mL | ORAL | Status: DC
  Administered 2020-03-29: 1000 mL

## 2020-03-29 MED ORDER — PROSOURCE TF PO LIQD: 45.0000 mL | Freq: Two times a day (BID) | ORAL | 1 refills | Status: AC

## 2020-03-29 MED ORDER — MIDAZOLAM 50MG/50ML (1MG/ML) PREMIX INFUSION: 0.0000 mg/h | INTRAVENOUS | 0 refills | Status: AC

## 2020-03-29 MED ORDER — METOPROLOL TARTRATE 5 MG/5ML IV SOLN: 5.0000 mg | Freq: Three times a day (TID) | INTRAVENOUS | 0 refills | Status: AC

## 2020-03-29 NOTE — Discharge Summary (Signed)
Physician Discharge Summary  Cody Hale UEK:[id number] DOB: [birth year] DOA: 03/16/2020  PCP: [name], MD  Admit date: 03/16/2020  Discharge date: 03/29/2020  Admitted From: Assisted living facility  Disposition:  LTACH  Recommendations for Outpatient Follow-up:  1. Follow up with PCP in 1-2 weeks 2. Please obtain BMP/CBC in one week 3. 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 Plan to discharge to LTAC for ventilator weaning 5. Follow-up in outpatient setting with cardiology to consider TEE at a later date and time once respiratory status is further improved. 6. Continue on Lasix and potassium supplementation as prescribed.  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  -Patient was initially started on IV Rocephin and azithromycin for treatment of community-acquired pneumonia.  Patient was eventually noted to have growth of strep group G and blood cultures and Rocephin was continued for ongoing treatment based on ID and sensitivity panels.  Patient did undergo transthoracic echocardiogram with no findings of overt endocarditis noted and this was performed on 03/18/2020.   Repeat blood cultures did return negative and ID consultation recommended TEE for further evaluation, however patient's respiratory status precluded him from undergoing a TEE at this time and therefore, plans are to continue IV antibiotics with Rocephin as prescribed for total of 6 weeks duration to cover for possible endocarditis.  Patient has had an ongoing difficult time with weaning off the ventilator and therefore, will go to long-term acute care facility for ongoing weaning process to continue at that facility.  Discharge Diagnoses:  Principal Problem:   Sepsis due to pneumonia Active Problems:   OSA (obstructive sleep apnea)   Essential hypertension, malignant   Type II diabetes mellitus with manifestations (HCC)   Chronic systolic CHF (congestive heart failure) (HCC)   Goals of care, counseling/discussion   COPD (chronic obstructive pulmonary disease) with chronic bronchitis (HCC)   Chronic atrial fibrillation   S/P mitral valve replacement with bioprosthetic valve   Major depressive disorder, recurrent severe without psychotic features ([place])   Chronic respiratory failure with hypoxia ([place])   Community acquired pneumonia   Streptococcus G Bacteremia   Endotracheally intubated   Palliative care by specialist  Principal discharge diagnosis: Sepsis secondary to strep group G bacteremia.  Discharge Instructions  Discharge Instructions    [facility] pharmacist to adjust dose for Vancomycin, Aminoglycosides and other anti-infective therapies as requested by physician.   Complete by: As directed    [facility] to provide Cath Flo 82m   Complete by: As directed    Administer for PICC line occlusion and as ordered by physician for other access device issues.   Anaphylaxis Kit: Provided to treat any anaphylactic reaction to the medication being provided to the patient if First Dose or when requested by physician   Complete by: As directed    Epinephrine 146mml  vial / amp: Administer 0.50m150m0.50ml84mubcutaneously once for moderate to severe anaphylaxis, nurse to  call physician and pharmacy when reaction occurs and call 911 if needed for immediate care   Diphenhydramine 24m/ml IV vial: Administer 25-55mIV/IM PRN for first dose reaction, rash, itching, mild reaction, nurse to call physician and pharmacy when reaction occurs   Sodium Chloride 0.9% NS 50047mV: Administer if needed for hypovolemic blood pressure drop or as ordered by physician after call to physician with anaphylactic reaction   Change dressing on IV access line weekly and PRN   Complete by: As directed    Diet - low sodium heart healthy   Complete by: As directed    Discharge wound care:   Complete by: As directed    Wound care daily.   Flush IV access with Sodium Chloride 0.9% and Heparin 10 units/ml or 100 units/ml   Complete by: As directed    Home infusion instructions - [facility]   Complete by: As directed    Instructions: Flush IV access with Sodium Chloride 0.9% and Heparin 10units/ml or 100units/ml   Change dressing on IV access line: Weekly and PRN   Instructions Cath Flo 2mg22mdminister for PICC Line occlusion and as ordered by physician for other access device   [facility] pharmacist to adjust dose for: Vancomycin, Aminoglycosides and other anti-infective therapies as requested by physician   Increase activity slowly   Complete by: As directed    Method of administration may be changed at the discretion of home infusion pharmacist based upon assessment of the patient and/or caregiver's ability to self-administer the medication ordered   Complete by: As directed      Allergies as of 03/29/2020      Reactions   Daptomycin Rash   Lisinopril Cough   Metformin And Related Diarrhea   Tape Rash, Other (See Comments)   Adhesive Tape-Burn skin.      Medication List    STOP taking these medications   carvedilol 3.125 MG tablet Commonly known as:  COREG   Doxepin HCl 3 MG Tabs   fluticasone 50 MCG/ACT nasal spray Commonly known as: FLONASE   losartan 25 MG tablet Commonly known as: COZAAR   nitroGLYCERIN 0.4 MG SL tablet Commonly known as: NITROSTAT   oxycodone 5 MG capsule Commonly known as: OXY-IR   torsemide 20 MG tablet Commonly known as: DEMADEX   Trelegy Ellipta 100-62.5-25 MCG/INH Aepb Generic drug: Fluticasone-Umeclidin-Vilant     TAKE these medications   accu-chek soft touch lancets Use to check blood sugar 3x per day. DX: E11.8 Accu Check   acetaminophen 325 MG tablet Commonly known as: TYLENOL Take 325 mg by mouth every 6 (six) hours as needed.   albuterol (2.5 MG/3ML) 0.083% nebulizer solution Commonly known as: PROVENTIL Take 2.5 mg by nebulization every 6 (six) hours as needed for wheezing or shortness of breath.   ascorbic acid 500 MG tablet Commonly known as: VITAMIN C Take 500 mg by mouth daily.   blood glucose meter kit and supplies Kit Use to check blood sugar 3x per day. DX: E11.8 Accu Check   cefTRIAXone  IVPB Commonly known as: ROCEPHIN Inject 2 g into the vein daily. Indication:  Bacteremia First Dose: Yes Last Day of Therapy:  04/30/20 Labs - Once weekly:  CBC/D and BMP, Labs - Every other week:  ESR and CRP Method of administration: IV Push Method of administration may be changed at the discretion of home infusion pharmacist based upon assessment of the patient and/or caregiver's ability to self-administer the medication ordered. Start taking  on: March 30, 2020   Cool Blood Glucose Test Strips test strip Generic drug: glucose blood Use to check blood sugar 3x per day. DX: E11.8 Accu Check   feeding supplement (VITAL HIGH PROTEIN) Liqd liquid Place 1,000 mLs into feeding tube continuous. What changed:   how much to take  how to take this  when to take this   feeding supplement (PROSource TF) liquid Place 45 mLs into feeding tube 2 (two) times daily. What changed:  You were already taking a medication with the same name, and this prescription was added. Make sure you understand how and when to take each.   ferrous sulfate 325 (65 FE) MG tablet Take 1 tablet (325 mg total) by mouth 2 (two) times daily with a meal.   furosemide 10 MG/ML injection Commonly known as: LASIX Inject 2 mLs (20 mg total) into the vein daily. Start taking on: March 30, 2020   ipratropium-albuterol 0.5-2.5 (3) MG/3ML Soln Commonly known as: DUONEB Take 3 mLs by nebulization every 6 (six) hours as needed.   levocetirizine 5 MG tablet Commonly known as: XYZAL Place 1 tablet (5 mg total) into feeding tube every evening.   magnesium oxide 400 MG tablet Commonly known as: MAG-OX Take 1 tablet by mouth 2 (two) times daily.   metoCLOPramide 5 MG tablet Commonly known as: REGLAN Take 5 mg by mouth 3 (three) times daily before meals.   metoprolol tartrate 5 MG/5ML Soln injection Commonly known as: LOPRESSOR Inject 5 mLs (5 mg total) into the vein every 8 (eight) hours.   MIDAZOLAM 50MG/50ML (1MG/ML) PREMIX INFUSION Inject 0-10 mg/hr into the vein continuous.   multivitamin with minerals Tabs tablet Place 1 tablet into feeding tube daily.   mupirocin ointment 2 % Commonly known as: BACTROBAN On leg rash bid   NovoFine Autocover 30G X 8 MM Misc Generic drug: Insulin Pen Needle USE TID.   NovoLOG FlexPen 100 UNIT/ML FlexPen Generic drug: insulin aspart Inject 5 Units into the skin with breakfast, with lunch, and with evening meal.   omeprazole 40 MG capsule Commonly known as: PRILOSEC Take 40 mg by mouth daily.   ondansetron 4 MG tablet Commonly known as: ZOFRAN Take 4 mg by mouth every 8 (eight) hours as needed for nausea or vomiting.   OXYGEN Inhale 2 L/min into the lungs continuous.   pantoprazole sodium 40 mg/20 mL Pack Commonly known as: PROTONIX Place 20 mLs (40 mg total) into feeding tube daily.   potassium chloride 20 MEQ/15ML (10%)  Soln Take 15 mLs (20 mEq total) by mouth daily. Start taking on: March 30, 2020   rivaroxaban 20 MG Tabs tablet Commonly known as: Xarelto Take one tablet daily via tube.   rosuvastatin 5 MG tablet Commonly known as: CRESTOR Take 5 mg by mouth daily.   tamsulosin 0.4 MG Caps capsule Commonly known as: FLOMAX Take 1 capsule (0.4 mg total) by mouth daily after breakfast.   Tyler Aas 100 UNIT/ML Soln Generic drug: Insulin Degludec Inject 40 Units into the skin daily.   valACYclovir 1000 MG tablet Commonly known as: VALTREX Take 1 tablet (1,000 mg total) by mouth 3 (three) times daily.   Vilazodone HCl 40 MG Tabs Commonly known as: VIIBRYD Take 40 mg by mouth daily.            Discharge Care Instructions  (From admission, onward)         Start     Ordered   03/29/20 0000  Change dressing on IV access  line weekly and PRN  (Home infusion instructions - [facility] )        03/29/20 1125   03/29/20 0000  Discharge wound care:       Comments: Wound care daily.   03/29/20 1125          Follow-up Information    [name], MD Follow up in 1 week(s).   Specialty: Internal Medicine Contact information: [place] [address] [phone]              Allergies  Allergen Reactions  . Daptomycin Rash  . Lisinopril Cough  . Metformin And Related Diarrhea  . Tape Rash and Other (See Comments)    Adhesive Tape-Burn skin.    Consultations:  Cardiology  ID on phone  PCCM   Procedures/Studies: DG Chest 2 View  Result Date: 03/20/2020 CLINICAL DATA:  Shortness of breath. EXAM: CHEST - 2 VIEW COMPARISON:  March 19, 2020. FINDINGS: There is stable complete opacification of left hemithorax most likely due to a combination of effusion and atelectasis. Sternotomy wires are noted. Stable right basilar opacity is noted concerning for edema or atelectasis. No pneumothorax is noted. IMPRESSION: Stable complete opacification  of left hemithorax most likely due to combination of effusion and atelectasis. Stable right basilar opacity concerning for edema or atelectasis. Electronically Signed   By: Marijo Conception M.D.   On: 03/20/2020 14:41   CT CHEST WO CONTRAST  Addendum Date: 03/23/2020   ADDENDUM REPORT: 03/23/2020 18:53 ADDENDUM: Transcription error: Impression point #4 should read: Dense mitral annular calcifications, could consider nonemergent echocardiography for evaluation of valve/cardiac function. Electronically Signed   By: Lovena Le M.D.   On: 03/23/2020 18:53   Result Date: 03/23/2020 CLINICAL DATA:  Shortness of breath, history of CHF, COPD. Thoracentesis 03/21/2019 EXAM: CT CHEST WITHOUT CONTRAST TECHNIQUE: Multidetector CT imaging of the chest was performed following the standard protocol without IV contrast. COMPARISON:  Chest CT 05/19/2018 FINDINGS: Imaging quality is severely degraded due to patient body habitus resulting in extensive photon starvation which also exacerbates the streak artifact from patient's hardware Cardiovascular: Cardiomegaly with four-chamber cardiac enlargement. Dense calcification of mitral annulus. Three-vessel coronary artery atherosclerosis. Focal pericardial thickening towards the apical septum (4/99). Atherosclerotic plaque within the normal caliber aorta. No periaortic stranding or hemorrhage. Normal 3 vessel branching of the aortic arch. Proximal great vessels are calcified but otherwise of grossly unremarkable. Central pulmonary artery enlargement, nonspecific but similar to prior and possibly reflecting some chronic pulmonary hypertension. Luminal evaluation precluded in the absence of contrast media. Pulmonary vascularity within the lung parenchyma is cephalized and redistributed. Luminal evaluation of the vasculature precluded in the absence of contrast. Mediastinum/Nodes: Mild edematous changes in the mediastinum. Few enlarged and borderline enlarged low-attenuation  mediastinal nodes are present including a 14 mm subcarinal node (2/71). No axillary adenopathy. Hilar nodal evaluation is severely limited in the absence intravenous contrast media. Postsurgical changes in the anterior mediastinum likely related prior sternotomy and CABG. Endotracheal tube terminates low within the trachea, 1.7 cm from the carina. Consider retracting at least 2 cm to the mid trachea. Transesophageal tube is in place. Tip terminates below the level of imaging with but with the side port appropriately positioned distal to the GE junction. Thyroid gland and thoracic inlet are unremarkable. Lungs/Pleura: Moderate to large bilateral pleural effusions, right greater than left with essentially complete atelectatic collapse of both lower lobes, subsegmental collapse of the right middle lobe and lingula as well.  Underlying consolidation is not excluded particularly given areas of mixed consolidation and ground-glass within the aerated portions of the upper lungs. Some mild interlobular septal thickening is noted as well. No pneumothorax. Upper Abdomen: Small amount of subdiaphragmatic ascites. Questionable stranding centered upon the pancreatic tail. Upper abdominal atherosclerosis. Musculoskeletal: Appearance of extensive bridging syndesmophytes as well as fusion across the spinous processes with some exaggerated thoracic kyphosis. Could correlate for features ankylosing spondylitis. No acute fracture or suspicious osseous lesions. Prior sternotomy as well. Lower thoracolumbar fusion hardware is noted including vertebral augmentation changes at the T11 and 12 levels. Additional degenerative changes in both shoulders. Moderate body wall edema. IMPRESSION: 1. Imaging quality is severely degraded due to patient body habitus resulting in extensive photon starvation which also exacerbates the streak artifact from patient's hardware. 2. Moderate to large bilateral pleural effusions, right greater than left, with  essentially complete atelectatic collapse of both lower lobes, subsegmental collapse of the right middle lobe and lingula as well. Underlying consolidation in the regions of volume loss is not excluded with evidence of additional areas of consolidation and ground-glass within the aerated portions of the upper lungs concerning for an acute infectious/inflammatory process including atypical viral etiologies. 3. Additional features throughout the chest suggesting CHF/volume overload with pulmonary edema, cardiomegaly, ascites and body wall edema. 4. Dense mitral annular calcifications, could consider nonemergent echocardiography for evaluation of valvulae op a 3. 5. Focal pericardial thickening towards the apical septum. Nonspecific appearance. Could correlate for features of pericarditis or ischemic change. 6. Central pulmonary artery enlargement, nonspecific and incompletely evaluated in the absence of contrast media but similar to prior and possibly reflecting some chronic pulmonary hypertension. 7. Small amount of subdiaphragmatic ascites. 8. Questionable stranding centered upon the pancreatic tail. Possibly redistributed ascites though should correlate with lipase to exclude pancreatitis. 9. Appearance of extensive bridging syndesmophytes as well as fusion across the spinous processes with some exaggerated thoracic kyphosis. Such imaging features can be seen in the setting of ankylosing spondylitis. 10. Aortic Atherosclerosis (ICD10-I70.0). Electronically Signed: By: Lovena Le M.D. On: 03/21/2020 18:16   [place] CHEST (PLEURAL EFFUSION)  Result Date: 03/20/2020 CLINICAL DATA:  Dyspnea. EXAM: CHEST ULTRASOUND - LIMITED COMPARISON:  Chest radiograph 03/20/2020 FINDINGS: Left chest sonography demonstrates a moderate left pleural effusion. We were unable to sit the patient up to perform thoracentesis. The patient was very stiff and even with several caregivers holding the patient, we were not able to hold him in a  steady position at the site of the bed adequate to perform thoracentesis. There is too great of a risk of him falling or sliding during thoracentesis due to his rigidity in the hips and legs. IMPRESSION: 1. At least moderate left pleural effusion is visualized sonographically. Thoracentesis was not attempted as the patient could not be positioned adequately. Electronically Signed   By: Van Clines M.D.   On: 03/20/2020 15:00   DG CHEST PORT 1 VIEW  Result Date: 03/26/2020 CLINICAL DATA:  Status post intubation.  COPD.  Former smoker. EXAM: PORTABLE CHEST 1 VIEW COMPARISON:  03/21/2020 FINDINGS: ET tube tip is above the carina. The NG tube tip is below the GE junction. Previous median sternotomy. Stable cardiac enlargement. Moderate to large bilateral pleural effusions are again noted and appear increased from previous exam. Persistent bibasilar atelectasis and airspace consolidation. IMPRESSION: 1. Increase in volume of bilateral pleural effusions. 2. Persistent bibasilar atelectasis and airspace consolidation. Electronically Signed   By: Kerby Moors M.D.   On: 03/26/2020 11:09  DG CHEST PORT 1 VIEW  Result Date: 03/23/2020 CLINICAL DATA:  OG tube placement EXAM: PORTABLE CHEST 1 VIEW COMPARISON:  Radiograph 03/23/2020 FINDINGS: Endotracheal tube tip in the mid to lower trachea, 2.7 cm from the carina. Transesophageal tube tip terminates below the level of imaging with the side port beyond the GE junction. Telemetry leads overlie the chest. Postsurgical changes from prior sternotomy. Persistent layering bilateral effusions with hazy opacities in the lung bases likely reflecting combination of airspace disease, atelectasis and superimposed edema. IMPRESSION: 1. Endotracheal tube tip in the mid to lower trachea, 2.7 cm from the carina. 2. Transesophageal tube tip terminates below the level of imaging with the side port beyond the GE junction. 3. Persistent layering effusions. 4. Residual airspace  opacities could reflect edema, atelectasis or airspace disease or combination there of. Electronically Signed   By: Lovena Le M.D.   On: 03/23/2020 18:54   DG Chest Port 1 View  Result Date: 03/23/2020 CLINICAL DATA:  Respiratory failure. EXAM: PORTABLE CHEST 1 VIEW COMPARISON:  03/21/2020 FINDINGS: The endotracheal tube and NG tubes are stable. Stable mild cardiac enlargement. Significant interval improved aeration of the left lung probable resolution of mucous plugging. Persistent left basilar atelectasis and small bilateral effusions. IMPRESSION: Stable support apparatus. Significant interval improved aeration of the left lung with probable resolution of mucous plugging. Electronically Signed   By: Marijo Sanes M.D.   On: 03/23/2020 07:21   DG CHEST PORT 1 VIEW  Result Date: 03/21/2020 CLINICAL DATA:  Hypoxia EXAM: PORTABLE CHEST 1 VIEW COMPARISON:  March 21, 2020 study obtained earlier in the day FINDINGS: Endotracheal tube tip is 4.5 cm above the carina. Nasogastric tube tip is in the proximal stomach with the side port near the gastroesophageal junction, not well seen. No pneumothorax. There is extensive pleural effusion on the left with essentially complete opacification of the left hemithorax. There may well be underlying atelectasis and infiltrate on the left. On the right, there is apparent loculated effusion in the lateral right base. Right lung otherwise clear. Heart is prominent, stable, with evidence of previous median sternotomy. There is aortic atherosclerosis. Pulmonary vascularity on the right appears unremarkable. Pulmonary vascularity on the left is obscured fluid. No adenopathy evident in areas that can be assessed for potential adenopathy. Postoperative change noted in the lower thoracic and lumbar regions. IMPRESSION: Tube and catheter positions as described without pneumothorax. It may be prudent to consider advancing endotracheal tube 4-5 cm to insure that both tube tip and  side port are well within the stomach. Extensive opacification on the left with essentially complete opacification of the left hemithorax, likely due to combination of fluid and atelectasis. Superimposed pneumonia on the left cannot be excluded. Probable loculated pleural effusion lateral right base. Right lung otherwise clear. Stable cardiac silhouette. Aortic Atherosclerosis (ICD10-I70.0). Electronically Signed   By: Lowella Grip III M.D.   On: 03/21/2020 15:23   DG CHEST PORT 1 VIEW  Result Date: 03/21/2020 CLINICAL DATA:  Shortness of breath, history CHF, COPD, atrial fibrillation, obesity, was COVID-19 negative on 03/16/2020, post thoracentesis 03/20/2020 EXAM: PORTABLE CHEST 1 VIEW COMPARISON:  Portable exam 1342 hours compared to 03/20/2020 FINDINGS: Rotated to the LEFT. Upper normal size of cardiac silhouette post median sternotomy. Pulmonary vascular congestion. Bibasilar effusions and atelectasis, significantly decreased on LEFT since previous exam. Minimal pulmonary edema question. No pneumothorax. Bones demineralized with prior thoracolumbar fusion. IMPRESSION: Bibasilar pleural effusions and atelectasis, decreased on LEFT since prior exam. Question minimal pulmonary edema. Electronically  Signed   By: Lavonia Dana M.D.   On: 03/21/2020 14:02   DG CHEST PORT 1 VIEW  Result Date: 03/19/2020 CLINICAL DATA:  Worsening dyspnea today, COPD, diabetes mellitus, hypertension, negative COVID-19 test on 03/16/2020 EXAM: PORTABLE CHEST 1 VIEW COMPARISON:  Portable exam 1603 hours compared to 03/16/2020 FINDINGS: Opacification of LEFT hemithorax by pleural effusion and atelectasis increased from prior study. LEFT heart border obscured. Patchy infiltrates mid to lower RIGHT lung with minimal basilar atelectasis and probable small pleural effusion. No pneumothorax. Bones demineralized. IMPRESSION: Patchy RIGHT lung infiltrates with basilar pleural effusion and atelectasis. Opacified LEFT hemithorax by  combination of atelectasis and pleural effusion, underlying infiltrate not excluded. Electronically Signed   By: Lavonia Dana M.D.   On: 03/19/2020 16:21   DG Chest Port 1 View  Result Date: 03/16/2020 CLINICAL DATA:  Concern for sepsis. EXAM: PORTABLE CHEST 1 VIEW COMPARISON:  06/17/2019 chest radiograph and prior. FINDINGS: Left basilar/retrocardiac patchy opacities. No pneumothorax. Small left pleural effusion. Cardiomegaly and prominence of the central pulmonary vessels. Post sternotomy sequela. IMPRESSION: Cardiomegaly and central pulmonary vascular congestion. Left basilar opacities. Differential includes infection, edema and atelectasis. Electronically Signed   By: Primitivo Gauze M.D.   On: 03/16/2020 08:14   ECHOCARDIOGRAM COMPLETE  Result Date: 03/18/2020    ECHOCARDIOGRAM REPORT   Patient Name:   KAVEN CUMBIE Date of Exam: 03/18/2020 Medical Rec #:  [id number]    Height:       69.0 in Accession #:    [id number]   Weight:       240.0 lb Date of Birth:  [birth year]    BSA:          2.232 m Patient Age:    57 years     BP:           110/68 mmHg Patient Gender: M            HR:           67 bpm. Exam Location:  [facility] Procedure: 2D Echo and Intracardiac Opacification Agent Indications:    Bacteremia R78.81  History:        Patient has prior history of Echocardiogram examinations, most                 recent 05/20/2019. CHF, COPD, Arrythmias:Atrial Fibrillation;                 Risk Factors:Hypertension and Diabetes.                  Mitral Valve: 29 mm St. Jude tissue valve valve is present in                 the mitral position. Procedure Date: 09/07/2012.  Sonographer:    Mikki Santee RDCS (AE) Referring Phys: [id number] COURAGE EMOKPAE IMPRESSIONS  1. Left ventricular ejection fraction, by estimation, is 35 to 40%. The left ventricle has moderately decreased function. The left ventricle demonstrates global hypokinesis. There is mild concentric left ventricular hypertrophy. Left ventricular  diastolic parameters are indeterminate.  2. Right ventricular systolic function is normal. The right ventricular size is normal. There is moderately elevated pulmonary artery systolic pressure. The estimated right ventricular systolic pressure is 93.8 mmHg.  3. Left atrial size was severely dilated.  4. The mitral valve has been repaired/replaced. No evidence of mitral valve regurgitation. Moderate to severe mitral stenosis. The mean mitral valve gradient is 11.7 mmHg at 89bpm. There is a 29 mm St. Jude tissue  valve present in the mitral position. Procedure Date: 09/07/2012. Echo findings are consistent with normal structure and function of the mitral valve prosthesis.  5. The aortic valve is tricuspid. Aortic valve regurgitation is not visualized. Mild aortic valve sclerosis is present, with no evidence of aortic valve stenosis.  6. The inferior vena cava is dilated in size with <50% respiratory variability, suggesting right atrial pressure of 15 mmHg.  7. Compared to prior study 05/2018, the mean MV gradient of the MV tissue valve has increased from 8 to 78mHg. FINDINGS  Left Ventricle: Left ventricular ejection fraction, by estimation, is 35 to 40%. The left ventricle has moderately decreased function. The left ventricle demonstrates global hypokinesis. Definity contrast agent was given IV to delineate the left ventricular endocardial borders. The left ventricular internal cavity size was normal in size. There is mild concentric left ventricular hypertrophy. Left ventricular diastolic parameters are indeterminate. Right Ventricle: The right ventricular size is normal. No increase in right ventricular wall thickness. Right ventricular systolic function is normal. There is moderately elevated pulmonary artery systolic pressure. The tricuspid regurgitant velocity is 3.16 m/s, and with an assumed right atrial pressure of 15 mmHg, the estimated right ventricular systolic pressure is 576.1mmHg. Left Atrium: Left atrial  size was severely dilated. Right Atrium: Right atrial size was normal in size. Pericardium: There is no evidence of pericardial effusion. Mitral Valve: The mitral valve has been repaired/replaced. No evidence of mitral valve regurgitation. There is a 29 mm St. Jude tissue valve present in the mitral position. Procedure Date: 09/07/2012. Echo findings are consistent with normal structure and function of the mitral valve prosthesis. Moderate to severe mitral valve stenosis. MV peak gradient, 28.7 mmHg. The mean mitral valve gradient is 11.7 mmHg. Tricuspid Valve: The tricuspid valve is normal in structure. Tricuspid valve regurgitation is mild . No evidence of tricuspid stenosis. Aortic Valve: The aortic valve is tricuspid. Aortic valve regurgitation is not visualized. Mild aortic valve sclerosis is present, with no evidence of aortic valve stenosis. Pulmonic Valve: The pulmonic valve was normal in structure. Pulmonic valve regurgitation is trivial. No evidence of pulmonic stenosis. Aorta: The aortic root is normal in size and structure. Venous: The inferior vena cava is dilated in size with less than 50% respiratory variability, suggesting right atrial pressure of 15 mmHg. IAS/Shunts: No atrial level shunt detected by color flow Doppler.  LEFT VENTRICLE PLAX 2D LVIDd:         5.42 cm LVIDs:         4.46 cm LV PW:         1.22 cm LV IVS:        1.30 cm LVOT diam:     2.50 cm LV SV:         59 LV SV Index:   26 LVOT Area:     4.91 cm  RIGHT VENTRICLE TAPSE (M-mode): 1.0 cm LEFT ATRIUM              Index       RIGHT ATRIUM           Index LA diam:        5.10 cm  2.28 cm/m  RA Area:     20.30 cm LA Vol (A2C):   121.0 ml 54.20 ml/m RA Volume:   59.20 ml  26.52 ml/m LA Vol (A4C):   114.0 ml 51.06 ml/m LA Biplane Vol: 123.0 ml 55.10 ml/m  AORTIC VALVE LVOT Vmax:   72.10 cm/s LVOT Vmean:  48.067 cm/s LVOT VTI:    0.120 m  AORTA Ao Root diam: 3.60 cm MITRAL VALVE              TRICUSPID VALVE MV Area (PHT): 2.16 cm   TR  Peak grad:   39.9 mmHg MV Peak grad:  28.7 mmHg  TR Vmax:        316.00 cm/s MV Mean grad:  11.7 mmHg MV Vmax:       2.68 m/s   SHUNTS MV Vmean:      159.3 cm/s Systemic VTI:  0.12 m                           Systemic Diam: 2.50 cm Fransico Him MD Electronically signed by Fransico Him MD Signature Date/Time: 03/18/2020/4:27:42 PM    Final       Discharge Exam: Vitals:   03/29/20 0800 03/29/20 0850  BP: 138/72   Pulse:    Resp: 20   Temp:  99.6 F (37.6 C)  SpO2: 93% 94%   Vitals:   03/29/20 0600 03/29/20 0700 03/29/20 0800 03/29/20 0850  BP: 129/87 134/67 138/72   Pulse:      Resp: _0 Temp:    99.6 F (37.6 C)  TempSrc:    Axillary  SpO2: 94% 93% 93% 94%  Weight:      Height:    _1  (1.753 m)    General: Pt is currently sedated on ventilator/intubated.  FiO2 40%. Cardiovascular: RRR, S1/S2 +, no rubs, no gallops Respiratory: CTA bilaterally, no wheezing, no rhonchi Abdominal: Soft, NT, ND, bowel sounds + Extremities: no edema, no cyanosis    The results of significant diagnostics from this hospitalization (including imaging, microbiology, ancillary and laboratory) are listed below for reference.     Microbiology: Recent Results (from the past 240 hour(s))  MRSA PCR Screening     Status: None   Collection Time: 03/21/20  1:20 PM   Specimen: Nasopharyngeal  Result Value Ref Range Status   MRSA by PCR NEGATIVE NEGATIVE Final    Comment:        The GeneXpert MRSA Assay (FDA approved for NASAL specimens only), is one component of a comprehensive MRSA colonization surveillance program. It is not intended to diagnose MRSA infection nor to guide or monitor treatment for MRSA infections. Performed at [facility], [address] [place], [place] [address]   Culture, respiratory     Status: None   Collection Time: 03/21/20  3:10 PM   Specimen: Bronchoalveolar Lavage; Respiratory  Result Value Ref Range Status   Specimen Description   Final    BRONCHIAL  ALVEOLAR LAVAGE Performed at [facility], [address], [place], [place] [address]    Special Requests   Final    NONE Performed at [facility], [address], [place], [place] [address]    Gram Stain NO WBC SEEN NO ORGANISMS SEEN   Final   Culture   Final    Normal respiratory flora-no Staph aureus or Pseudomonas seen Performed at [facility] [address] [place], [place] [address]    Report Status 03/24/2020 FINAL  Final     Labs: BNP (last 3 results) Recent Labs    05/06/19 1552 06/03/19 2149  BNP 326.0* 488.8*   Basic Metabolic Panel: Recent Labs  Lab 03/22/20 1647 03/23/20 0307 03/24/20 0536 03/25/20 0412 03/27/20 0559 03/28/20 0325  NA  --  145  145 146* 145 143  K  --  3.6 3.2* 3.6 3.2* 3.1*  CL  --  105 104 105 105 106  CO2  --  32 34* 35* 30 29  GLUCOSE  --  182* 150* 123* 102* 136*  BUN  --  35* 31* _0 CREATININE  --  0.59* 0.55* 0.47* 0.54* 0.56*  CALCIUM  --  8.1* 7.8* 8.1* 8.3* 8.0*  MG 2.0  --   --   --   --  2.0  PHOS 4.8*  --   --   --   --  2.6   Liver Function Tests: Recent Labs  Lab 03/27/20 0559  AST 20  ALT 14  ALKPHOS 64  BILITOT 1.0  PROT 5.6*  ALBUMIN 2.2*   No results for input(s): LIPASE, AMYLASE in the last 168 hours. No results for input(s): AMMONIA in the last 168 hours. CBC: Recent Labs  Lab 03/23/20 0307 03/25/20 0412 03/27/20 0559 03/28/20 0325  WBC 7.3 4.4 4.7 5.0  HGB 10.5* 10.4* 10.6* 10.4*  HCT 34.8* 35.6* 34.7* 34.1*  MCV 94.3 97.8 93.5 93.4  PLT 155 159 165 156   Cardiac Enzymes: No results for input(s): CKTOTAL, CKMB, CKMBINDEX, TROPONINI in the last 168 hours. BNP: Invalid input(s): POCBNP CBG: Recent Labs  Lab 03/28/20 1614 03/28/20 2006 03/29/20 0035 03/29/20 0726 03/29/20 1103  GLUCAP 146* 164* 155* 140* 131*   D-Dimer No results for input(s): DDIMER in the last 72 hours. Hgb A1c No results for input(s): HGBA1C in the last 72 hours. Lipid Profile No results for  input(s): CHOL, HDL, LDLCALC, TRIG, CHOLHDL, LDLDIRECT in the last 72 hours. Thyroid function studies No results for input(s): TSH, T4TOTAL, T3FREE, THYROIDAB in the last 72 hours.  Invalid input(s): FREET3 Anemia work up No results for input(s): VITAMINB12, FOLATE, FERRITIN, TIBC, IRON, RETICCTPCT in the last 72 hours. Urinalysis    Component Value Date/Time   COLORURINE YELLOW 03/16/2020 1538   APPEARANCEUR CLEAR 03/16/2020 1538   APPEARANCEUR Clear 07/23/2017 0000   LABSPEC 1.017 03/16/2020 1538   PHURINE 7.0 03/16/2020 1538   GLUCOSEU NEGATIVE 03/16/2020 1538   GLUCOSEU 500 (A) 07/14/2017 1053   HGBUR NEGATIVE 03/16/2020 1538   BILIRUBINUR NEGATIVE 03/16/2020 1538   BILIRUBINUR Negative 07/23/2017 0000   KETONESUR NEGATIVE 03/16/2020 1538   PROTEINUR NEGATIVE 03/16/2020 1538   UROBILINOGEN 0.2 07/14/2017 1053   NITRITE NEGATIVE 03/16/2020 1538   LEUKOCYTESUR NEGATIVE 03/16/2020 1538   Sepsis Labs Invalid input(s): PROCALCITONIN,  WBC,  LACTICIDVEN Microbiology Recent Results (from the past 240 hour(s))  MRSA PCR Screening     Status: None   Collection Time: 03/21/20  1:20 PM   Specimen: Nasopharyngeal  Result Value Ref Range Status   MRSA by PCR NEGATIVE NEGATIVE Final    Comment:        The GeneXpert MRSA Assay (FDA approved for NASAL specimens only), is one component of a comprehensive MRSA colonization surveillance program. It is not intended to diagnose MRSA infection nor to guide or monitor treatment for MRSA infections. Performed at [facility], [address], [place], [place] [address]   Culture, respiratory     Status: None   Collection Time: 03/21/20  3:10 PM   Specimen: Bronchoalveolar Lavage; Respiratory  Result Value Ref Range Status   Specimen Description   Final    BRONCHIAL ALVEOLAR LAVAGE Performed at [facility], [address] [place], [place] [address]    Special Requests   Final  NONE Performed at [facility], [address],  [place], [place] [address]    Gram Stain NO WBC SEEN NO ORGANISMS SEEN   Final   Culture   Final    Normal respiratory flora-no Staph aureus or Pseudomonas seen Performed at [facility] [address], [place], [place] [address]    Report Status 03/24/2020 FINAL  Final     Time coordinating discharge: 40 minutes  SIGNED:   Rodena Goldmann, DO [facility] 03/29/2020, 11:30 AM  If 7PM-7AM, please contact night-coverage [email or web address]

## 2020-03-29 NOTE — Care Management Important Message (Signed)
Important Message  Patient Details  Name: Cody Hale MRN: [id number] Date of Birth: 1951-06-20   Medicare Important Message Given:  Yes     Corey Harold 03/29/2020, 12:19 PM

## 2020-03-29 NOTE — Progress Notes (Signed)
PHARMACY CONSULT NOTE FOR:  OUTPATIENT  PARENTERAL ANTIBIOTIC THERAPY (OPAT)  Indication: Bacteremia  Regimen: Ceftriaxone 2000 mg IV every 24 hours End date: Last day of therapy 04/30/20  IV antibiotic discharge orders are pended. To discharging provider:  please sign these orders via discharge navigator,  Select New Orders & click on the button choice - Manage This Unsigned Work.     Thank you for allowing pharmacy to be a part of this patient's care.  Tad Moore 03/29/2020, 10:45 AM

## 2020-03-29 NOTE — TOC Transition Note (Addendum)
Transition of Care [facility]) - CM/SW Discharge Note   Patient Details  Name: Cody Hale MRN: [id number] Date of Birth: [birth year]  [facility] CM/SW Contact:  Cody Bleak, RN Phone Number: 03/29/2020, 10:23 AM   Clinical Narrative:   Patient has been approved for a bed at [facility]. Alcario Drought was able to get consent from daughter - Cody Hale. TOC updated daughter as well. Transport scheduled with [facility] for today at [facility]  For Room 5E10 acccepting MD Carron Curie.  RN and MD updated.   Addendum Alcario Drought from [facility] called to say Sharin Mons is behind and this patient will be on the [facility] schedule for after 11Am on 9/23. TOC updated MD and RN.   Final next level of care: Long Term Acute Care (LTAC) Barriers to Discharge: Barriers Resolved   Patient Goals and CMS Choice Patient states their goals for this hospitalization and ongoing recovery are:: spoke with daughter, to transfere to [facility] CMS Medicare.gov Compare Post Acute Care list provided to:: Patient Represenative (must comment) Choice offered to / list presented to : Adult Children  Discharge Placement              Patient chooses bed at:  ([facility]) Patient to be transferred to facility by: [facility] Name of family member notified: Cody Hale - daughter Patient and family notified of of transfer: 03/29/20  Discharge Plan and Services     Post Acute Care Choice: NA          DME Arranged: N/A DME Agency: NA       HH Arranged: PT, OT HH Agency: [facility]     Readmission Risk Interventions Readmission Risk Prevention Plan 03/17/2020  Transportation Screening Complete  Medication Review Ocean[facility]) Complete  HRI or Home Care Consult Complete  SW Recovery Care/Counseling Consult Complete  Palliative Care Screening Not Applicable  Skilled Nursing Facility Not Applicable  Some recent data might be hidden

## 2020-03-29 NOTE — Progress Notes (Signed)
PT Cancellation Note  Patient Details Name: Cody Hale MRN: [id number] DOB: 1950-11-28   Cancelled Treatment:    Reason Eval/Treat Not Completed: Medical issues which prohibited therapy.  Patient intubated and transferring to a LTACH. Patient discharged from physical therapy to care of nursing.   11:57 AM, 03/29/20 Ocie Bob, MPT Physical Therapist with [facility] 336 [phone] office [phone] mobile phone

## 2020-03-30 ENCOUNTER — Other Ambulatory Visit (HOSPITAL_COMMUNITY): Payer: Self-pay

## 2020-03-30 ENCOUNTER — Inpatient Hospital Stay
Admission: AD | Admit: 2020-03-30 | Discharge: 2020-04-27 | Disposition: A | Discharge status: Skilled Nursing Facility | Payer: Medicare Other | Source: Other Acute Inpatient Hospital | Attending: Internal Medicine | Admitting: Internal Medicine

## 2020-03-30 DIAGNOSIS — A409 Streptococcal sepsis, unspecified: Secondary | ICD-10-CM | POA: Diagnosis not present

## 2020-03-30 DIAGNOSIS — Z4682 Encounter for fitting and adjustment of non-vascular catheter: Secondary | ICD-10-CM | POA: Diagnosis not present

## 2020-03-30 DIAGNOSIS — F419 Anxiety disorder, unspecified: Secondary | ICD-10-CM | POA: Diagnosis present

## 2020-03-30 DIAGNOSIS — I5043 Acute on chronic combined systolic (congestive) and diastolic (congestive) heart failure: Secondary | ICD-10-CM | POA: Diagnosis present

## 2020-03-30 DIAGNOSIS — Z96641 Presence of right artificial hip joint: Secondary | ICD-10-CM | POA: Diagnosis not present

## 2020-03-30 DIAGNOSIS — J9690 Respiratory failure, unspecified, unspecified whether with hypoxia or hypercapnia: Secondary | ICD-10-CM | POA: Diagnosis not present

## 2020-03-30 DIAGNOSIS — I517 Cardiomegaly: Secondary | ICD-10-CM | POA: Diagnosis not present

## 2020-03-30 DIAGNOSIS — I5042 Chronic combined systolic (congestive) and diastolic (congestive) heart failure: Secondary | ICD-10-CM | POA: Diagnosis not present

## 2020-03-30 DIAGNOSIS — I509 Heart failure, unspecified: Secondary | ICD-10-CM | POA: Diagnosis not present

## 2020-03-30 DIAGNOSIS — J90 Pleural effusion, not elsewhere classified: Secondary | ICD-10-CM

## 2020-03-30 DIAGNOSIS — I482 Chronic atrial fibrillation, unspecified: Secondary | ICD-10-CM | POA: Diagnosis not present

## 2020-03-30 DIAGNOSIS — I5022 Chronic systolic (congestive) heart failure: Secondary | ICD-10-CM | POA: Diagnosis not present

## 2020-03-30 DIAGNOSIS — Z9911 Dependence on respirator [ventilator] status: Secondary | ICD-10-CM

## 2020-03-30 DIAGNOSIS — I5032 Chronic diastolic (congestive) heart failure: Secondary | ICD-10-CM | POA: Diagnosis not present

## 2020-03-30 DIAGNOSIS — R531 Weakness: Secondary | ICD-10-CM | POA: Diagnosis present

## 2020-03-30 DIAGNOSIS — L89890 Pressure ulcer of other site, unstageable: Secondary | ICD-10-CM | POA: Diagnosis not present

## 2020-03-30 DIAGNOSIS — Z0189 Encounter for other specified special examinations: Secondary | ICD-10-CM

## 2020-03-30 DIAGNOSIS — M6281 Muscle weakness (generalized): Secondary | ICD-10-CM | POA: Diagnosis not present

## 2020-03-30 DIAGNOSIS — J189 Pneumonia, unspecified organism: Secondary | ICD-10-CM

## 2020-03-30 DIAGNOSIS — J95851 Ventilator associated pneumonia: Secondary | ICD-10-CM | POA: Diagnosis present

## 2020-03-30 DIAGNOSIS — J440 Chronic obstructive pulmonary disease with acute lower respiratory infection: Secondary | ICD-10-CM | POA: Diagnosis present

## 2020-03-30 DIAGNOSIS — L89613 Pressure ulcer of right heel, stage 3: Secondary | ICD-10-CM | POA: Diagnosis not present

## 2020-03-30 DIAGNOSIS — A4189 Other specified sepsis: Secondary | ICD-10-CM | POA: Diagnosis not present

## 2020-03-30 DIAGNOSIS — Z981 Arthrodesis status: Secondary | ICD-10-CM | POA: Diagnosis not present

## 2020-03-30 DIAGNOSIS — Z743 Need for continuous supervision: Secondary | ICD-10-CM | POA: Diagnosis not present

## 2020-03-30 DIAGNOSIS — R5381 Other malaise: Secondary | ICD-10-CM | POA: Diagnosis not present

## 2020-03-30 DIAGNOSIS — I361 Nonrheumatic tricuspid (valve) insufficiency: Secondary | ICD-10-CM | POA: Diagnosis not present

## 2020-03-30 DIAGNOSIS — E46 Unspecified protein-calorie malnutrition: Secondary | ICD-10-CM | POA: Diagnosis present

## 2020-03-30 DIAGNOSIS — J9621 Acute and chronic respiratory failure with hypoxia: Secondary | ICD-10-CM | POA: Diagnosis present

## 2020-03-30 DIAGNOSIS — Z20822 Contact with and (suspected) exposure to covid-19: Secondary | ICD-10-CM | POA: Diagnosis not present

## 2020-03-30 DIAGNOSIS — J9600 Acute respiratory failure, unspecified whether with hypoxia or hypercapnia: Secondary | ICD-10-CM | POA: Diagnosis not present

## 2020-03-30 DIAGNOSIS — N179 Acute kidney failure, unspecified: Secondary | ICD-10-CM | POA: Diagnosis not present

## 2020-03-30 DIAGNOSIS — F339 Major depressive disorder, recurrent, unspecified: Secondary | ICD-10-CM | POA: Diagnosis not present

## 2020-03-30 DIAGNOSIS — R1312 Dysphagia, oropharyngeal phase: Secondary | ICD-10-CM | POA: Diagnosis not present

## 2020-03-30 DIAGNOSIS — I4819 Other persistent atrial fibrillation: Secondary | ICD-10-CM | POA: Diagnosis not present

## 2020-03-30 DIAGNOSIS — J811 Chronic pulmonary edema: Secondary | ICD-10-CM

## 2020-03-30 DIAGNOSIS — A408 Other streptococcal sepsis: Secondary | ICD-10-CM | POA: Diagnosis present

## 2020-03-30 DIAGNOSIS — I38 Endocarditis, valve unspecified: Secondary | ICD-10-CM | POA: Diagnosis present

## 2020-03-30 DIAGNOSIS — J9601 Acute respiratory failure with hypoxia: Secondary | ICD-10-CM | POA: Diagnosis not present

## 2020-03-30 DIAGNOSIS — R0902 Hypoxemia: Secondary | ICD-10-CM | POA: Diagnosis present

## 2020-03-30 DIAGNOSIS — Z4659 Encounter for fitting and adjustment of other gastrointestinal appliance and device: Secondary | ICD-10-CM

## 2020-03-30 DIAGNOSIS — J9811 Atelectasis: Secondary | ICD-10-CM | POA: Diagnosis not present

## 2020-03-30 DIAGNOSIS — Z7901 Long term (current) use of anticoagulants: Secondary | ICD-10-CM | POA: Diagnosis not present

## 2020-03-30 DIAGNOSIS — R001 Bradycardia, unspecified: Secondary | ICD-10-CM | POA: Diagnosis not present

## 2020-03-30 DIAGNOSIS — K567 Ileus, unspecified: Secondary | ICD-10-CM | POA: Diagnosis not present

## 2020-03-30 DIAGNOSIS — I4821 Permanent atrial fibrillation: Secondary | ICD-10-CM | POA: Diagnosis not present

## 2020-03-30 DIAGNOSIS — Z9289 Personal history of other medical treatment: Secondary | ICD-10-CM

## 2020-03-30 DIAGNOSIS — R279 Unspecified lack of coordination: Secondary | ICD-10-CM | POA: Diagnosis not present

## 2020-03-30 DIAGNOSIS — J449 Chronic obstructive pulmonary disease, unspecified: Secondary | ICD-10-CM | POA: Diagnosis not present

## 2020-03-30 DIAGNOSIS — A419 Sepsis, unspecified organism: Secondary | ICD-10-CM | POA: Diagnosis not present

## 2020-03-30 DIAGNOSIS — I447 Left bundle-branch block, unspecified: Secondary | ICD-10-CM | POA: Diagnosis present

## 2020-03-30 DIAGNOSIS — J1282 Pneumonia due to coronavirus disease 2019: Secondary | ICD-10-CM | POA: Diagnosis not present

## 2020-03-30 DIAGNOSIS — I110 Hypertensive heart disease with heart failure: Secondary | ICD-10-CM | POA: Diagnosis present

## 2020-03-30 DIAGNOSIS — Z952 Presence of prosthetic heart valve: Secondary | ICD-10-CM | POA: Diagnosis not present

## 2020-03-30 DIAGNOSIS — D649 Anemia, unspecified: Secondary | ICD-10-CM | POA: Diagnosis present

## 2020-03-30 DIAGNOSIS — Z96649 Presence of unspecified artificial hip joint: Secondary | ICD-10-CM | POA: Diagnosis present

## 2020-03-30 DIAGNOSIS — G4730 Sleep apnea, unspecified: Secondary | ICD-10-CM | POA: Diagnosis present

## 2020-03-30 DIAGNOSIS — J9620 Acute and chronic respiratory failure, unspecified whether with hypoxia or hypercapnia: Secondary | ICD-10-CM | POA: Diagnosis not present

## 2020-03-30 DIAGNOSIS — Z953 Presence of xenogenic heart valve: Secondary | ICD-10-CM | POA: Diagnosis not present

## 2020-03-30 DIAGNOSIS — I2510 Atherosclerotic heart disease of native coronary artery without angina pectoris: Secondary | ICD-10-CM | POA: Diagnosis not present

## 2020-03-30 DIAGNOSIS — Z87891 Personal history of nicotine dependence: Secondary | ICD-10-CM | POA: Diagnosis not present

## 2020-03-30 DIAGNOSIS — R0989 Other specified symptoms and signs involving the circulatory and respiratory systems: Secondary | ICD-10-CM

## 2020-03-30 DIAGNOSIS — L97419 Non-pressure chronic ulcer of right heel and midfoot with unspecified severity: Secondary | ICD-10-CM | POA: Diagnosis present

## 2020-03-30 DIAGNOSIS — K6389 Other specified diseases of intestine: Secondary | ICD-10-CM | POA: Diagnosis not present

## 2020-03-30 DIAGNOSIS — R2689 Other abnormalities of gait and mobility: Secondary | ICD-10-CM | POA: Diagnosis not present

## 2020-03-30 DIAGNOSIS — R0603 Acute respiratory distress: Secondary | ICD-10-CM

## 2020-03-30 DIAGNOSIS — F332 Major depressive disorder, recurrent severe without psychotic features: Secondary | ICD-10-CM | POA: Diagnosis not present

## 2020-03-30 DIAGNOSIS — L89159 Pressure ulcer of sacral region, unspecified stage: Secondary | ICD-10-CM | POA: Diagnosis not present

## 2020-03-30 DIAGNOSIS — J918 Pleural effusion in other conditions classified elsewhere: Secondary | ICD-10-CM | POA: Diagnosis not present

## 2020-03-30 DIAGNOSIS — U071 COVID-19: Secondary | ICD-10-CM | POA: Diagnosis not present

## 2020-03-30 DIAGNOSIS — R6520 Severe sepsis without septic shock: Secondary | ICD-10-CM | POA: Diagnosis not present

## 2020-03-30 DIAGNOSIS — R7881 Bacteremia: Secondary | ICD-10-CM | POA: Diagnosis not present

## 2020-03-30 DIAGNOSIS — L89153 Pressure ulcer of sacral region, stage 3: Secondary | ICD-10-CM | POA: Diagnosis present

## 2020-03-30 DIAGNOSIS — G9341 Metabolic encephalopathy: Secondary | ICD-10-CM | POA: Diagnosis not present

## 2020-03-30 DIAGNOSIS — J40 Bronchitis, not specified as acute or chronic: Secondary | ICD-10-CM | POA: Diagnosis not present

## 2020-03-30 DIAGNOSIS — E1165 Type 2 diabetes mellitus with hyperglycemia: Secondary | ICD-10-CM | POA: Diagnosis not present

## 2020-03-30 DIAGNOSIS — J690 Pneumonitis due to inhalation of food and vomit: Secondary | ICD-10-CM | POA: Diagnosis not present

## 2020-03-30 DIAGNOSIS — E11622 Type 2 diabetes mellitus with other skin ulcer: Secondary | ICD-10-CM | POA: Diagnosis not present

## 2020-03-30 DIAGNOSIS — G4733 Obstructive sleep apnea (adult) (pediatric): Secondary | ICD-10-CM | POA: Diagnosis not present

## 2020-03-30 DIAGNOSIS — E119 Type 2 diabetes mellitus without complications: Secondary | ICD-10-CM | POA: Diagnosis present

## 2020-03-30 HISTORY — DX: Sepsis, unspecified organism: A41.9

## 2020-03-30 HISTORY — DX: Chronic combined systolic (congestive) and diastolic (congestive) heart failure: I50.42

## 2020-03-30 HISTORY — DX: Sepsis, unspecified organism: R65.20

## 2020-03-30 HISTORY — DX: Acute and chronic respiratory failure with hypoxia: J96.21

## 2020-03-30 HISTORY — DX: Chronic atrial fibrillation, unspecified: I48.20

## 2020-03-30 HISTORY — DX: Chronic obstructive pulmonary disease, unspecified: J44.9

## 2020-03-30 LAB — BLOOD GAS, ARTERIAL
Acid-Base Excess: 4 mmol/L — ABNORMAL HIGH (ref 0.0–2.0)
Bicarbonate: 26.8 mmol/L (ref 20.0–28.0)
FIO2: 30
O2 Saturation: 95.2 %
Patient temperature: 37.5
pCO2 arterial: 31.6 mmHg — ABNORMAL LOW (ref 32.0–48.0)
pH, Arterial: 7.538 — ABNORMAL HIGH (ref 7.350–7.450)
pO2, Arterial: 71.8 mmHg — ABNORMAL LOW (ref 83.0–108.0)

## 2020-03-30 LAB — GLUCOSE, CAPILLARY
Glucose-Capillary: 116 mg/dL — ABNORMAL HIGH (ref 70–99)
Glucose-Capillary: 141 mg/dL — ABNORMAL HIGH (ref 70–99)
Glucose-Capillary: 164 mg/dL — ABNORMAL HIGH (ref 70–99)

## 2020-03-30 MED ORDER — OSMOLITE 1.5 CAL PO LIQD: 1000.0000 mL | ORAL | 1 refills | Status: AC
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
## 2020-03-30 NOTE — Progress Notes (Signed)
Patient seen and evaluated this morning with no acute overnight events noted.  He is sedated and on ventilator.  Please refer to discharge summary dictated 9/22 for full details.  He is stable for transfer to LTAC to pursue ventilator weaning and further care.  Total care time: 20 minutes.

## 2020-03-31 ENCOUNTER — Other Ambulatory Visit (HOSPITAL_COMMUNITY): Payer: Self-pay

## 2020-03-31 DIAGNOSIS — R001 Bradycardia, unspecified: Secondary | ICD-10-CM | POA: Diagnosis not present

## 2020-03-31 DIAGNOSIS — J9621 Acute and chronic respiratory failure with hypoxia: Secondary | ICD-10-CM | POA: Diagnosis not present

## 2020-03-31 DIAGNOSIS — A419 Sepsis, unspecified organism: Secondary | ICD-10-CM

## 2020-03-31 DIAGNOSIS — R6520 Severe sepsis without septic shock: Secondary | ICD-10-CM | POA: Diagnosis not present

## 2020-03-31 DIAGNOSIS — Z9911 Dependence on respirator [ventilator] status: Secondary | ICD-10-CM | POA: Diagnosis not present

## 2020-03-31 DIAGNOSIS — J449 Chronic obstructive pulmonary disease, unspecified: Secondary | ICD-10-CM | POA: Diagnosis not present

## 2020-03-31 DIAGNOSIS — G4733 Obstructive sleep apnea (adult) (pediatric): Secondary | ICD-10-CM | POA: Diagnosis not present

## 2020-03-31 DIAGNOSIS — Z952 Presence of prosthetic heart valve: Secondary | ICD-10-CM | POA: Diagnosis not present

## 2020-03-31 DIAGNOSIS — I482 Chronic atrial fibrillation, unspecified: Secondary | ICD-10-CM

## 2020-03-31 DIAGNOSIS — J9620 Acute and chronic respiratory failure, unspecified whether with hypoxia or hypercapnia: Secondary | ICD-10-CM | POA: Diagnosis not present

## 2020-03-31 DIAGNOSIS — I5042 Chronic combined systolic (congestive) and diastolic (congestive) heart failure: Secondary | ICD-10-CM

## 2020-03-31 DIAGNOSIS — L89159 Pressure ulcer of sacral region, unspecified stage: Secondary | ICD-10-CM | POA: Diagnosis not present

## 2020-03-31 DIAGNOSIS — I4821 Permanent atrial fibrillation: Secondary | ICD-10-CM | POA: Diagnosis not present

## 2020-03-31 LAB — COMPREHENSIVE METABOLIC PANEL
ALT: 16 U/L (ref 0–44)
AST: 18 U/L (ref 15–41)
Albumin: 2.2 g/dL — ABNORMAL LOW (ref 3.5–5.0)
Alkaline Phosphatase: 71 U/L (ref 38–126)
Anion gap: 7 (ref 5–15)
BUN: 15 mg/dL (ref 8–23)
CO2: 27 mmol/L (ref 22–32)
Calcium: 8 mg/dL — ABNORMAL LOW (ref 8.9–10.3)
Chloride: 109 mmol/L (ref 98–111)
Creatinine, Ser: 0.63 mg/dL (ref 0.61–1.24)
GFR calc Af Amer: >60 mL/min (ref >60)
GFR calc non Af Amer: >60 mL/min (ref >60)
Glucose, Bld: 157 mg/dL — ABNORMAL HIGH (ref 70–99)
Potassium: 3.5 mmol/L (ref 3.5–5.1)
Sodium: 143 mmol/L (ref 135–145)
Total Bilirubin: 0.4 mg/dL (ref 0.3–1.2)
Total Protein: 5.8 g/dL — ABNORMAL LOW (ref 6.5–8.1)

## 2020-03-31 LAB — CBC WITH DIFFERENTIAL/PLATELET
Abs Immature Granulocytes: 0.02 10*3/uL (ref 0.00–0.07)
Basophils Absolute: 0 10*3/uL (ref 0.0–0.1)
Basophils Relative: 1 %
Eosinophils Absolute: 0.1 10*3/uL (ref 0.0–0.5)
Eosinophils Relative: 2 %
HCT: 35.5 % — ABNORMAL LOW (ref 39.0–52.0)
Hemoglobin: 10.5 g/dL — ABNORMAL LOW (ref 13.0–17.0)
Immature Granulocytes: 0 %
Lymphocytes Relative: 10 %
Lymphs Abs: 0.5 10*3/uL — ABNORMAL LOW (ref 0.7–4.0)
MCH: 27.9 pg (ref 26.0–34.0)
MCHC: 29.6 g/dL — ABNORMAL LOW (ref 30.0–36.0)
MCV: 94.4 fL (ref 80.0–100.0)
Monocytes Absolute: 0.4 10*3/uL (ref 0.1–1.0)
Monocytes Relative: 8 %
Neutro Abs: 4 10*3/uL (ref 1.7–7.7)
Neutrophils Relative %: 79 %
Platelets: 155 10*3/uL (ref 150–400)
RBC: 3.76 MIL/uL — ABNORMAL LOW (ref 4.22–5.81)
RDW: 16.2 % — ABNORMAL HIGH (ref 11.5–15.5)
WBC: 5.1 10*3/uL (ref 4.0–10.5)
nRBC: 0 % (ref 0.0–0.2)

## 2020-03-31 LAB — PROTIME-INR
INR: 1.6 — ABNORMAL HIGH (ref 0.8–1.2)
Prothrombin Time: 18 seconds — ABNORMAL HIGH (ref 11.4–15.2)

## 2020-03-31 LAB — HEMOGLOBIN A1C
Hgb A1c MFr Bld: 6 % — ABNORMAL HIGH (ref 4.8–5.6)
Mean Plasma Glucose: 125.5 mg/dL

## 2020-03-31 LAB — MAGNESIUM: Magnesium: 2 mg/dL (ref 1.7–2.4)

## 2020-03-31 LAB — PHOSPHORUS: Phosphorus: 3.3 mg/dL (ref 2.5–4.6)
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
## 2020-04-01 ENCOUNTER — Encounter: Payer: Self-pay | Admitting: Internal Medicine

## 2020-04-01 DIAGNOSIS — A419 Sepsis, unspecified organism: Secondary | ICD-10-CM | POA: Diagnosis present

## 2020-04-01 DIAGNOSIS — I482 Chronic atrial fibrillation, unspecified: Secondary | ICD-10-CM | POA: Diagnosis not present

## 2020-04-01 DIAGNOSIS — Z9911 Dependence on respirator [ventilator] status: Secondary | ICD-10-CM | POA: Diagnosis not present

## 2020-04-01 DIAGNOSIS — J189 Pneumonia, unspecified organism: Secondary | ICD-10-CM | POA: Diagnosis not present

## 2020-04-01 DIAGNOSIS — J9621 Acute and chronic respiratory failure with hypoxia: Secondary | ICD-10-CM | POA: Diagnosis not present

## 2020-04-01 DIAGNOSIS — I5043 Acute on chronic combined systolic (congestive) and diastolic (congestive) heart failure: Secondary | ICD-10-CM | POA: Diagnosis present

## 2020-04-01 DIAGNOSIS — R6520 Severe sepsis without septic shock: Secondary | ICD-10-CM | POA: Diagnosis present

## 2020-04-01 DIAGNOSIS — J9620 Acute and chronic respiratory failure, unspecified whether with hypoxia or hypercapnia: Secondary | ICD-10-CM | POA: Diagnosis not present

## 2020-04-01 DIAGNOSIS — I5042 Chronic combined systolic (congestive) and diastolic (congestive) heart failure: Secondary | ICD-10-CM | POA: Diagnosis not present

## 2020-04-01 DIAGNOSIS — J449 Chronic obstructive pulmonary disease, unspecified: Secondary | ICD-10-CM | POA: Diagnosis not present

## 2020-04-01 DIAGNOSIS — G4733 Obstructive sleep apnea (adult) (pediatric): Secondary | ICD-10-CM | POA: Diagnosis not present

## 2020-04-01 DIAGNOSIS — G4730 Sleep apnea, unspecified: Secondary | ICD-10-CM | POA: Diagnosis present
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
## 2020-04-01 NOTE — Progress Notes (Signed)
Pulmonary Critical Care Medicine [facility] [place]   PULMONARY CRITICAL CARE SERVICE  PROGRESS NOTE  Date of Service: 04/01/2020  Cody Hale  QQV:[id number]  DOB: March 09, 1951   DOA: 03/30/2020  Referring Physician: Carron Curie, MD  HPI: Cody Hale is a 69 y.o. male seen for follow up of Acute on Chronic Respiratory Failure.  Patient at this time is on assist control mode has been on 20% FiO2 has been having increasing heart rate so therefore not able to tolerate weaning  Medications: Reviewed on Rounds  Physical Exam:  Vitals: Temperature is 97.1 pulse 70 respiratory rate is 19 blood pressure is 150/87 saturations 100%  Ventilator Settings on assist control FiO2 28% tidal volume 585 PEEP 5  . General: Comfortable at this time . Eyes: Grossly normal lids, irises & conjunctiva . ENT: grossly tongue is normal . Neck: no obvious mass . Cardiovascular: S1 S2 normal no gallop . Respiratory: No rhonchi very coarse breath sounds . Abdomen: soft . Skin: no rash seen on limited exam . Musculoskeletal: not rigid . Psychiatric:unable to assess . Neurologic: no seizure no involuntary movements         Lab Data:   Basic Metabolic Panel: Recent Labs  Lab 03/27/20 0559 03/28/20 0325 03/31/20 1351  NA 145 143 143  K 3.2* 3.1* 3.5  CL 105 106 109  CO2 30 29 27   GLUCOSE 102* 136* 157*  BUN 21 20 15   CREATININE 0.54* 0.56* 0.63  CALCIUM 8.3* 8.0* 8.0*  MG  --  2.0 2.0  PHOS  --  2.6 3.3    ABG: Recent Labs  Lab 03/26/20 1200 03/30/20 1644  PHART 7.433 7.538*  PCO2ART 50.0* 31.6*  PO2ART 107 71.8*  HCO3 31.6* 26.8  O2SAT 98.2 95.2    Liver Function Tests: Recent Labs  Lab 03/27/20 0559 03/31/20 1351  AST 20 18  ALT 14 16  ALKPHOS 64 71  BILITOT 1.0 0.4  PROT 5.6* 5.8*  ALBUMIN 2.2* 2.2*   No results for input(s): LIPASE, AMYLASE in the last 168 hours. No results for input(s): AMMONIA in the last 168 hours.  CBC: Recent Labs  Lab  03/27/20 0559 03/28/20 0325 03/31/20 1351  WBC 4.7 5.0 5.1  NEUTROABS  --   --  4.0  HGB 10.6* 10.4* 10.5*  HCT 34.7* 34.1* 35.5*  MCV 93.5 93.4 94.4  PLT 165 156 155    Cardiac Enzymes: No results for input(s): CKTOTAL, CKMB, CKMBINDEX, TROPONINI in the last 168 hours.  BNP (last 3 results) Recent Labs    05/06/19 1552 06/03/19 2149  BNP 326.0* 368.9*    ProBNP (last 3 results) No results for input(s): PROBNP in the last 8760 hours.  Radiological Exams: DG Abd 1 View  Result Date: 03/30/2020 CLINICAL DATA:  Encounter for OG-tube placement. EXAM: ABDOMEN - 1 VIEW COMPARISON:  None. FINDINGS: Tip and side port of the enteric tube below the diaphragm in the left upper quadrant of the abdomen, expected location of the stomach. Air scattered throughout nondilated bowel loops throughout the abdomen. IMPRESSION: Tip and side port of the enteric tube below the diaphragm in the stomach. Electronically Signed   By: [id number] M.D.   On: 03/30/2020 16:57   DG CHEST PORT 1 VIEW  Result Date: 03/31/2020 CLINICAL DATA:  Respiratory failure EXAM: PORTABLE CHEST 1 VIEW COMPARISON:  03/26/2020 FINDINGS: Endotracheal tube is seen 5.7 cm above the carina. Nasogastric tube extends into the upper abdomen beyond the margin of the examination.  Bilateral pleural effusions have significantly reduced in size since prior examination. Retrocardiac opacification persists, representing atelectasis or infiltrate within this region. No pneumothorax. Median sternotomy has been performed. Cardiac size is at the upper limits of normal. Multiple healed bilateral rib fractures are noted. No acute bone abnormality. IMPRESSION: Near complete resolution of bilateral pleural effusions. Persistent retrocardiac atelectasis or infiltrate. Stable support tubes. Electronically Signed   By: Helyn Numbers MD   On: 03/31/2020 06:21    Assessment/Plan Active Problems:   Acute on chronic respiratory failure with hypoxia  (HCC)   Severe sepsis (HCC)   Chronic combined systolic and diastolic heart failure (HCC)   Chronic atrial fibrillation (HCC)   Sleep apnea   COPD, severe (HCC)   1. Acute on chronic respiratory failure hypoxia we will continue with full support on assist control titrate oxygen continue pulmonary toilet. 2. Severe sepsis resolved hemodynamics are stable 3. Chronic combined systolic and diastolic heart failure appears to be compensated 4. Chronic atrial fibrillation rate is controlled 5. Sleep apnea nonissue 6. Severe COPD medical management   I have personally seen and evaluated the patient, evaluated laboratory and imaging results, formulated the assessment and plan and placed orders. The Patient requires high complexity decision making with multiple systems involvement.  Rounds were done with the Respiratory Therapy Director and Staff therapists and discussed with nursing staff also.  Yevonne Pax, MD [facility] Pulmonary Critical Care Medicine Sleep Medicine

## 2020-04-02 DIAGNOSIS — J449 Chronic obstructive pulmonary disease, unspecified: Secondary | ICD-10-CM | POA: Diagnosis not present

## 2020-04-02 DIAGNOSIS — G4733 Obstructive sleep apnea (adult) (pediatric): Secondary | ICD-10-CM | POA: Diagnosis not present

## 2020-04-02 DIAGNOSIS — R6520 Severe sepsis without septic shock: Secondary | ICD-10-CM | POA: Diagnosis not present

## 2020-04-02 DIAGNOSIS — I5042 Chronic combined systolic (congestive) and diastolic (congestive) heart failure: Secondary | ICD-10-CM | POA: Diagnosis not present

## 2020-04-02 DIAGNOSIS — J9621 Acute and chronic respiratory failure with hypoxia: Secondary | ICD-10-CM | POA: Diagnosis not present

## 2020-04-02 DIAGNOSIS — I482 Chronic atrial fibrillation, unspecified: Secondary | ICD-10-CM | POA: Diagnosis not present

## 2020-04-02 DIAGNOSIS — J189 Pneumonia, unspecified organism: Secondary | ICD-10-CM | POA: Diagnosis not present

## 2020-04-02 DIAGNOSIS — A419 Sepsis, unspecified organism: Secondary | ICD-10-CM | POA: Diagnosis not present

## 2020-04-02 DIAGNOSIS — Z9911 Dependence on respirator [ventilator] status: Secondary | ICD-10-CM | POA: Diagnosis not present

## 2020-04-02 DIAGNOSIS — J9620 Acute and chronic respiratory failure, unspecified whether with hypoxia or hypercapnia: Secondary | ICD-10-CM | POA: Diagnosis not present

## 2020-04-02 NOTE — Progress Notes (Signed)
Pulmonary Critical Care Medicine [facility] [place]   PULMONARY CRITICAL CARE SERVICE  PROGRESS NOTE  Date of Service: 04/02/2020  Cody Hale  [id number]  DOB: 06-16-1951   DOA: 03/30/2020  Referring Physician: Carron Curie, MD  HPI: Cody Hale is a 69 y.o. male seen for follow up of Acute on Chronic Respiratory Failure.  Patient currently is on pressure support has been on 28% FiO2 and the goal is for 2 hours today  Medications: Reviewed on Rounds  Physical Exam:  Vitals: Temperature is 96.2 pulse 79 respiratory rate 16 pressure is 105/51 saturations 97%  Ventilator Settings on pressure support FiO2 is 28% pressure 12/5  . General: Comfortable at this time . Eyes: Grossly normal lids, irises & conjunctiva . ENT: grossly tongue is normal . Neck: no obvious mass . Cardiovascular: S1 S2 normal no gallop . Respiratory: No rhonchi very coarse breath sounds . Abdomen: soft . Skin: no rash seen on limited exam . Musculoskeletal: not rigid . Psychiatric:unable to assess . Neurologic: no seizure no involuntary movements         Lab Data:   Basic Metabolic Panel: Recent Labs  Lab 03/27/20 0559 03/28/20 0325 03/31/20 1351  NA 145 143 143  K 3.2* 3.1* 3.5  CL 105 106 109  CO2 30 29 27   GLUCOSE 102* 136* 157*  BUN 21 20 15   CREATININE 0.54* 0.56* 0.63  CALCIUM 8.3* 8.0* 8.0*  MG  --  2.0 2.0  PHOS  --  2.6 3.3    ABG: Recent Labs  Lab 03/30/20 1644  PHART 7.538*  PCO2ART 31.6*  PO2ART 71.8*  HCO3 26.8  O2SAT 95.2    Liver Function Tests: Recent Labs  Lab 03/27/20 0559 03/31/20 1351  AST 20 18  ALT 14 16  ALKPHOS 64 71  BILITOT 1.0 0.4  PROT 5.6* 5.8*  ALBUMIN 2.2* 2.2*   No results for input(s): LIPASE, AMYLASE in the last 168 hours. No results for input(s): AMMONIA in the last 168 hours.  CBC: Recent Labs  Lab 03/27/20 0559 03/28/20 0325 03/31/20 1351  WBC 4.7 5.0 5.1  NEUTROABS  --   --  4.0  HGB 10.6* 10.4* 10.5*  HCT  34.7* 34.1* 35.5*  MCV 93.5 93.4 94.4  PLT 165 156 155    Cardiac Enzymes: No results for input(s): CKTOTAL, CKMB, CKMBINDEX, TROPONINI in the last 168 hours.  BNP (last 3 results) Recent Labs    05/06/19 1552 06/03/19 2149  BNP 326.0* 368.9*    ProBNP (last 3 results) No results for input(s): PROBNP in the last 8760 hours.  Radiological Exams: No results found.  Assessment/Plan Active Problems:   Acute on chronic respiratory failure with hypoxia (HCC)   Severe sepsis (HCC)   Chronic combined systolic and diastolic heart failure (HCC)   Chronic atrial fibrillation (HCC)   Sleep apnea   COPD, severe (HCC)   1. Acute on chronic respiratory failure with hypoxia we will continue with pressure support weaning as tolerated. 2. Severe sepsis resolved hemodynamics are stable 3. Chronic combined systolic and diastolic heart failure at baseline we will continue to monitor 4. Chronic atrial fibrillation rate is controlled 5. Sleep apnea at baseline 6. Severe COPD medical management   I have personally seen and evaluated the patient, evaluated laboratory and imaging results, formulated the assessment and plan and placed orders. The Patient requires high complexity decision making with multiple systems involvement.  Rounds were done with the Respiratory Therapy Director and Staff therapists and  discussed with nursing staff also.  Allyne Gee, MD [facility] Pulmonary Critical Care Medicine Sleep Medicine

## 2020-04-03 DIAGNOSIS — I482 Chronic atrial fibrillation, unspecified: Secondary | ICD-10-CM | POA: Diagnosis not present

## 2020-04-03 DIAGNOSIS — J189 Pneumonia, unspecified organism: Secondary | ICD-10-CM | POA: Diagnosis not present

## 2020-04-03 DIAGNOSIS — J9620 Acute and chronic respiratory failure, unspecified whether with hypoxia or hypercapnia: Secondary | ICD-10-CM | POA: Diagnosis not present

## 2020-04-03 DIAGNOSIS — J9621 Acute and chronic respiratory failure with hypoxia: Secondary | ICD-10-CM | POA: Diagnosis not present

## 2020-04-03 DIAGNOSIS — Z9911 Dependence on respirator [ventilator] status: Secondary | ICD-10-CM | POA: Diagnosis not present

## 2020-04-03 DIAGNOSIS — J449 Chronic obstructive pulmonary disease, unspecified: Secondary | ICD-10-CM | POA: Diagnosis not present

## 2020-04-03 DIAGNOSIS — R6520 Severe sepsis without septic shock: Secondary | ICD-10-CM | POA: Diagnosis not present

## 2020-04-03 DIAGNOSIS — I5042 Chronic combined systolic (congestive) and diastolic (congestive) heart failure: Secondary | ICD-10-CM | POA: Diagnosis not present

## 2020-04-03 DIAGNOSIS — A419 Sepsis, unspecified organism: Secondary | ICD-10-CM | POA: Diagnosis not present

## 2020-04-03 DIAGNOSIS — G4733 Obstructive sleep apnea (adult) (pediatric): Secondary | ICD-10-CM | POA: Diagnosis not present

## 2020-04-03 NOTE — Progress Notes (Signed)
Pulmonary Critical Care Medicine [facility] [place]   PULMONARY CRITICAL CARE SERVICE  PROGRESS NOTE  Date of Service: 04/03/2020  Cody Hale  YKZ:[id number]  DOB: Sep 26, 1950   DOA: 03/30/2020  Referring Physician: Carron Curie, MD  HPI: Cody Dorner is a 69 y.o. male seen for follow up of Acute on Chronic Respiratory Failure.  Patient currently is on pressure support has been on 28% FiO2 12/5 the goal today is for 4 hours  Medications: Reviewed on Rounds  Physical Exam:  Vitals: Temperature 96.7 pulse 51 respiratory rate 17 blood pressure is 167/78 saturations 100%  Ventilator Settings on pressure support FiO2 28% pressure 12/5  . General: Comfortable at this time . Eyes: Grossly normal lids, irises & conjunctiva . ENT: grossly tongue is normal . Neck: no obvious mass . Cardiovascular: S1 S2 normal no gallop . Respiratory: No rhonchi very coarse breath sounds . Abdomen: soft . Skin: no rash seen on limited exam . Musculoskeletal: not rigid . Psychiatric:unable to assess . Neurologic: no seizure no involuntary movements         Lab Data:   Basic Metabolic Panel: Recent Labs  Lab 03/28/20 0325 03/31/20 1351  NA 143 143  K 3.1* 3.5  CL 106 109  CO2 29 27  GLUCOSE 136* 157*  BUN 20 15  CREATININE 0.56* 0.63  CALCIUM 8.0* 8.0*  MG 2.0 2.0  PHOS 2.6 3.3    ABG: Recent Labs  Lab 03/30/20 1644  PHART 7.538*  PCO2ART 31.6*  PO2ART 71.8*  HCO3 26.8  O2SAT 95.2    Liver Function Tests: Recent Labs  Lab 03/31/20 1351  AST 18  ALT 16  ALKPHOS 71  BILITOT 0.4  PROT 5.8*  ALBUMIN 2.2*   No results for input(s): LIPASE, AMYLASE in the last 168 hours. No results for input(s): AMMONIA in the last 168 hours.  CBC: Recent Labs  Lab 03/28/20 0325 03/31/20 1351  WBC 5.0 5.1  NEUTROABS  --  4.0  HGB 10.4* 10.5*  HCT 34.1* 35.5*  MCV 93.4 94.4  PLT 156 155    Cardiac Enzymes: No results for input(s): CKTOTAL, CKMB, CKMBINDEX,  TROPONINI in the last 168 hours.  BNP (last 3 results) Recent Labs    05/06/19 1552 06/03/19 2149  BNP 326.0* 368.9*    ProBNP (last 3 results) No results for input(s): PROBNP in the last 8760 hours.  Radiological Exams: No results found.  Assessment/Plan Active Problems:   Acute on chronic respiratory failure with hypoxia (HCC)   Severe sepsis (HCC)   Chronic combined systolic and diastolic heart failure (HCC)   Chronic atrial fibrillation (HCC)   Sleep apnea   COPD, severe (HCC)   1. Acute on chronic respiratory failure with hypoxia we will continue with weaning on pressure support 12/5 goal 4 hours 2. Severe sepsis resolved hemodynamics are stable 3. Chronic combined systolic and diastolic heart failure appears to be compensated we will monitor 4. Chronic atrial fibrillation rate is controlled 5. Severe COPD medical management 6. Sleep apnea nonissue right now   I have personally seen and evaluated the patient, evaluated laboratory and imaging results, formulated the assessment and plan and placed orders. The Patient requires high complexity decision making with multiple systems involvement.  Rounds were done with the Respiratory Therapy Director and Staff therapists and discussed with nursing staff also.  Yevonne Pax, MD [facility] Pulmonary Critical Care Medicine Sleep Medicine

## 2020-04-04 ENCOUNTER — Other Ambulatory Visit (HOSPITAL_COMMUNITY): Payer: Self-pay

## 2020-04-04 DIAGNOSIS — I482 Chronic atrial fibrillation, unspecified: Secondary | ICD-10-CM | POA: Diagnosis not present

## 2020-04-04 DIAGNOSIS — I5042 Chronic combined systolic (congestive) and diastolic (congestive) heart failure: Secondary | ICD-10-CM | POA: Diagnosis not present

## 2020-04-04 DIAGNOSIS — G4733 Obstructive sleep apnea (adult) (pediatric): Secondary | ICD-10-CM | POA: Diagnosis not present

## 2020-04-04 DIAGNOSIS — Z9911 Dependence on respirator [ventilator] status: Secondary | ICD-10-CM | POA: Diagnosis not present

## 2020-04-04 DIAGNOSIS — I517 Cardiomegaly: Secondary | ICD-10-CM | POA: Diagnosis not present

## 2020-04-04 DIAGNOSIS — J9690 Respiratory failure, unspecified, unspecified whether with hypoxia or hypercapnia: Secondary | ICD-10-CM | POA: Diagnosis not present

## 2020-04-04 DIAGNOSIS — A419 Sepsis, unspecified organism: Secondary | ICD-10-CM | POA: Diagnosis not present

## 2020-04-04 DIAGNOSIS — J189 Pneumonia, unspecified organism: Secondary | ICD-10-CM | POA: Diagnosis not present

## 2020-04-04 DIAGNOSIS — J9621 Acute and chronic respiratory failure with hypoxia: Secondary | ICD-10-CM | POA: Diagnosis not present

## 2020-04-04 DIAGNOSIS — J90 Pleural effusion, not elsewhere classified: Secondary | ICD-10-CM | POA: Diagnosis not present

## 2020-04-04 DIAGNOSIS — J9620 Acute and chronic respiratory failure, unspecified whether with hypoxia or hypercapnia: Secondary | ICD-10-CM | POA: Diagnosis not present

## 2020-04-04 DIAGNOSIS — R6520 Severe sepsis without septic shock: Secondary | ICD-10-CM | POA: Diagnosis not present

## 2020-04-04 DIAGNOSIS — J449 Chronic obstructive pulmonary disease, unspecified: Secondary | ICD-10-CM | POA: Diagnosis not present

## 2020-04-04 LAB — CBC
HCT: 35.2 % — ABNORMAL LOW (ref 39.0–52.0)
Hemoglobin: 10.5 g/dL — ABNORMAL LOW (ref 13.0–17.0)
MCH: 27.3 pg (ref 26.0–34.0)
MCHC: 29.8 g/dL — ABNORMAL LOW (ref 30.0–36.0)
MCV: 91.7 fL (ref 80.0–100.0)
Platelets: 177 10*3/uL (ref 150–400)
RBC: 3.84 MIL/uL — ABNORMAL LOW (ref 4.22–5.81)
RDW: 15.9 % — ABNORMAL HIGH (ref 11.5–15.5)
WBC: 5.5 10*3/uL (ref 4.0–10.5)
nRBC: 0 % (ref 0.0–0.2)

## 2020-04-04 LAB — BASIC METABOLIC PANEL
Anion gap: 8 (ref 5–15)
BUN: 16 mg/dL (ref 8–23)
CO2: 28 mmol/L (ref 22–32)
Calcium: 8.6 mg/dL — ABNORMAL LOW (ref 8.9–10.3)
Chloride: 98 mmol/L (ref 98–111)
Creatinine, Ser: 0.56 mg/dL — ABNORMAL LOW (ref 0.61–1.24)
GFR calc Af Amer: >60 mL/min (ref >60)
GFR calc non Af Amer: >60 mL/min (ref >60)
Glucose, Bld: 162 mg/dL — ABNORMAL HIGH (ref 70–99)
Potassium: 4.8 mmol/L (ref 3.5–5.1)
Sodium: 134 mmol/L — ABNORMAL LOW (ref 135–145)

## 2020-04-04 LAB — PHOSPHORUS: Phosphorus: 3.1 mg/dL (ref 2.5–4.6)

## 2020-04-04 LAB — MAGNESIUM: Magnesium: 2.1 mg/dL (ref 1.7–2.4)

## 2020-04-04 NOTE — Progress Notes (Addendum)
Pulmonary Critical Care Medicine [facility] [place]   PULMONARY CRITICAL CARE SERVICE  PROGRESS NOTE  Date of Service: 04/04/2020  Cody Hale  WCB:[id number]  DOB: Nov 04, 1950   DOA: 03/30/2020  Referring Physician: Carron Curie, MD  HPI: Cody Hale is a 69 y.o. male seen for follow up of Acute on Chronic Respiratory Failure.  Patient remains on pressure support for an 8-hour goal today currently 28% FiO2 satting well no distress.  Medications: Reviewed on Rounds  Physical Exam:  Vitals: Pulse 50 respirations 27 BP 140/79 O2 sat 99% temp 98.9  Ventilator Settings pressure support 10/5 FiO2 28%  . General: Comfortable at this time . Eyes: Grossly normal lids, irises & conjunctiva . ENT: grossly tongue is normal . Neck: no obvious mass . Cardiovascular: S1 S2 normal no gallop . Respiratory: Coarse breath sounds . Abdomen: soft . Skin: no rash seen on limited exam . Musculoskeletal: not rigid . Psychiatric:unable to assess . Neurologic: no seizure no involuntary movements         Lab Data:   Basic Metabolic Panel: Recent Labs  Lab 03/31/20 1351 04/04/20 0943  NA 143 134*  K 3.5 4.8  CL 109 98  CO2 27 28  GLUCOSE 157* 162*  BUN 15 16  CREATININE 0.63 0.56*  CALCIUM 8.0* 8.6*  MG 2.0 2.1  PHOS 3.3 3.1    ABG: Recent Labs  Lab 03/30/20 1644  PHART 7.538*  PCO2ART 31.6*  PO2ART 71.8*  HCO3 26.8  O2SAT 95.2    Liver Function Tests: Recent Labs  Lab 03/31/20 1351  AST 18  ALT 16  ALKPHOS 71  BILITOT 0.4  PROT 5.8*  ALBUMIN 2.2*   No results for input(s): LIPASE, AMYLASE in the last 168 hours. No results for input(s): AMMONIA in the last 168 hours.  CBC: Recent Labs  Lab 03/31/20 1351  WBC 5.1  NEUTROABS 4.0  HGB 10.5*  HCT 35.5*  MCV 94.4  PLT 155    Cardiac Enzymes: No results for input(s): CKTOTAL, CKMB, CKMBINDEX, TROPONINI in the last 168 hours.  BNP (last 3 results) Recent Labs    05/06/19 1552 06/03/19 2149   BNP 326.0* 368.9*    ProBNP (last 3 results) No results for input(s): PROBNP in the last 8760 hours.  Radiological Exams: No results found.  Assessment/Plan Active Problems:   Acute on chronic respiratory failure with hypoxia (HCC)   Severe sepsis (HCC)   Chronic combined systolic and diastolic heart failure (HCC)   Chronic atrial fibrillation (HCC)   Sleep apnea   COPD, severe (HCC)   1. Acute on chronic respiratory failure with hypoxia we will continue with weaning on pressure support 10/5 goal 8 hours.  Currently on 20% FiO2 we will continue supportive measures and aggressive pulmonary toilet. 2. Severe sepsis resolved hemodynamics are stable 3. Chronic combined systolic and diastolic heart failure appears to be compensated we will monitor 4. Chronic atrial fibrillation rate is controlled 5. Severe COPD medical management 6. Sleep apnea nonissue right now   I have personally seen and evaluated the patient, evaluated laboratory and imaging results, formulated the assessment and plan and placed orders. The Patient requires high complexity decision making with multiple systems involvement.  Rounds were done with the Respiratory Therapy Director and Staff therapists and discussed with nursing staff also.  Yevonne Pax, MD [facility] Pulmonary Critical Care Medicine Sleep Medicine

## 2020-04-05 DIAGNOSIS — I5042 Chronic combined systolic (congestive) and diastolic (congestive) heart failure: Secondary | ICD-10-CM | POA: Diagnosis not present

## 2020-04-05 DIAGNOSIS — J189 Pneumonia, unspecified organism: Secondary | ICD-10-CM | POA: Diagnosis not present

## 2020-04-05 DIAGNOSIS — I482 Chronic atrial fibrillation, unspecified: Secondary | ICD-10-CM | POA: Diagnosis not present

## 2020-04-05 DIAGNOSIS — Z9911 Dependence on respirator [ventilator] status: Secondary | ICD-10-CM | POA: Diagnosis not present

## 2020-04-05 DIAGNOSIS — J449 Chronic obstructive pulmonary disease, unspecified: Secondary | ICD-10-CM | POA: Diagnosis not present

## 2020-04-05 DIAGNOSIS — J9621 Acute and chronic respiratory failure with hypoxia: Secondary | ICD-10-CM | POA: Diagnosis not present

## 2020-04-05 DIAGNOSIS — R6520 Severe sepsis without septic shock: Secondary | ICD-10-CM | POA: Diagnosis not present

## 2020-04-05 DIAGNOSIS — G4733 Obstructive sleep apnea (adult) (pediatric): Secondary | ICD-10-CM | POA: Diagnosis not present

## 2020-04-05 DIAGNOSIS — J9620 Acute and chronic respiratory failure, unspecified whether with hypoxia or hypercapnia: Secondary | ICD-10-CM | POA: Diagnosis not present

## 2020-04-05 DIAGNOSIS — A419 Sepsis, unspecified organism: Secondary | ICD-10-CM | POA: Diagnosis not present

## 2020-04-05 LAB — COMPREHENSIVE METABOLIC PANEL
ALT: 16 U/L (ref 0–44)
AST: 19 U/L (ref 15–41)
Albumin: 2.2 g/dL — ABNORMAL LOW (ref 3.5–5.0)
Alkaline Phosphatase: 83 U/L (ref 38–126)
Anion gap: 7 (ref 5–15)
BUN: 17 mg/dL (ref 8–23)
CO2: 28 mmol/L (ref 22–32)
Calcium: 8.4 mg/dL — ABNORMAL LOW (ref 8.9–10.3)
Chloride: 100 mmol/L (ref 98–111)
Creatinine, Ser: 0.55 mg/dL — ABNORMAL LOW (ref 0.61–1.24)
GFR calc Af Amer: >60 mL/min (ref >60)
GFR calc non Af Amer: >60 mL/min (ref >60)
Glucose, Bld: 146 mg/dL — ABNORMAL HIGH (ref 70–99)
Potassium: 4.6 mmol/L (ref 3.5–5.1)
Sodium: 135 mmol/L (ref 135–145)
Total Bilirubin: 0.6 mg/dL (ref 0.3–1.2)
Total Protein: 6 g/dL — ABNORMAL LOW (ref 6.5–8.1)

## 2020-04-05 LAB — CBC
HCT: 33.9 % — ABNORMAL LOW (ref 39.0–52.0)
Hemoglobin: 10.1 g/dL — ABNORMAL LOW (ref 13.0–17.0)
MCH: 27.6 pg (ref 26.0–34.0)
MCHC: 29.8 g/dL — ABNORMAL LOW (ref 30.0–36.0)
MCV: 92.6 fL (ref 80.0–100.0)
Platelets: 152 10*3/uL (ref 150–400)
RBC: 3.66 MIL/uL — ABNORMAL LOW (ref 4.22–5.81)
RDW: 15.9 % — ABNORMAL HIGH (ref 11.5–15.5)
WBC: 4.9 10*3/uL (ref 4.0–10.5)
nRBC: 0 % (ref 0.0–0.2)

## 2020-04-05 LAB — TROPONIN I (HIGH SENSITIVITY): Troponin I (High Sensitivity): 15 ng/L (ref <18)

## 2020-04-05 LAB — CK TOTAL AND CKMB (NOT AT ARMC)
CK, MB: 0.6 ng/mL (ref 0.5–5.0)
Relative Index: INVALID (ref 0.0–2.5)
Total CK: 26 U/L — ABNORMAL LOW (ref 49–397)

## 2020-04-05 LAB — PHOSPHORUS: Phosphorus: 3.8 mg/dL (ref 2.5–4.6)

## 2020-04-05 LAB — MAGNESIUM: Magnesium: 2 mg/dL (ref 1.7–2.4)

## 2020-04-05 LAB — BRAIN NATRIURETIC PEPTIDE: B Natriuretic Peptide: 184.4 pg/mL — ABNORMAL HIGH (ref 0.0–100.0)

## 2020-04-05 NOTE — Progress Notes (Addendum)
Pulmonary Critical Care Medicine [facility] [place]   PULMONARY CRITICAL CARE SERVICE  PROGRESS NOTE  Date of Service: 04/05/2020  Cody Hale  HQI:[id number]  DOB: July 31, 1950   DOA: 03/30/2020  Referring Physician: Carron Curie, MD  HPI: Cody Hale is a 69 y.o. male seen for follow up of Acute on Chronic Respiratory Failure. Patient has a 12-hour goal today on pressure support 12/5 FiO2 28% currently satting well no distress.   Medications: Reviewed on Rounds  Physical Exam:  Vitals: Pulse 47 respirations 15 BP 141/77 O2 sat 100% temp 96.7  Ventilator Settings ventilator mode pressure support 12/5 FiO2 28%  . General: Comfortable at this time . Eyes: Grossly normal lids, irises & conjunctiva . ENT: grossly tongue is normal . Neck: no obvious mass . Cardiovascular: S1 S2 normal no gallop . Respiratory: No rales or rhonchi noted . Abdomen: soft . Skin: no rash seen on limited exam . Musculoskeletal: not rigid . Psychiatric:unable to assess . Neurologic: no seizure no involuntary movements         Lab Data:   Basic Metabolic Panel: Recent Labs  Lab 03/31/20 1351 04/04/20 0943 04/05/20 0053  NA 143 134* 135  K 3.5 4.8 4.6  CL 109 98 100  CO2 27 28 28   GLUCOSE 157* 162* 146*  BUN 15 16 17   CREATININE 0.63 0.56* 0.55*  CALCIUM 8.0* 8.6* 8.4*  MG 2.0 2.1 2.0  PHOS 3.3 3.1 3.8    ABG: Recent Labs  Lab 03/30/20 1644  PHART 7.538*  PCO2ART 31.6*  PO2ART 71.8*  HCO3 26.8  O2SAT 95.2    Liver Function Tests: Recent Labs  Lab 03/31/20 1351 04/05/20 0053  AST 18 19  ALT 16 16  ALKPHOS 71 83  BILITOT 0.4 0.6  PROT 5.8* 6.0*  ALBUMIN 2.2* 2.2*   No results for input(s): LIPASE, AMYLASE in the last 168 hours. No results for input(s): AMMONIA in the last 168 hours.  CBC: Recent Labs  Lab 03/31/20 1351 04/04/20 0943 04/05/20 0053  WBC 5.1 5.5 4.9  NEUTROABS 4.0  --   --   HGB 10.5* 10.5* 10.1*  HCT 35.5* 35.2* 33.9*  MCV 94.4 91.7  92.6  PLT 155 177 152    Cardiac Enzymes: Recent Labs  Lab 04/05/20 0053  CKTOTAL 26*  CKMB 0.6    BNP (last 3 results) Recent Labs    05/06/19 1552 06/03/19 2149 04/05/20 0053  BNP 326.0* 368.9* 184.4*    ProBNP (last 3 results) No results for input(s): PROBNP in the last 8760 hours.  Radiological Exams: DG Chest Port 1 View  Result Date: 04/04/2020 CLINICAL DATA:  Respiratory failure. EXAM: PORTABLE CHEST 1 VIEW COMPARISON:  04/01/2023 FINDINGS: Endotracheal tube tip at the thoracic inlet. Enteric tube tip below the diaphragm not included in the field of view. Post median sternotomy. Stable cardiomegaly. Dense retrocardiac opacity is unchanged. Hazy right lung base opacity is unchanged. Mild vascular congestion. No pneumothorax. Remote bilateral rib fractures. IMPRESSION: 1. Unchanged cardiomegaly. Unchanged retrocardiac opacity which may represent airspace disease, pleural effusion or combination there of. Similar small right pleural effusion. 2. Slight vascular congestion. Electronically Signed   By: 04/06/2020 M.D.   On: 04/04/2020 23:37    Assessment/Plan Active Problems:   Acute on chronic respiratory failure with hypoxia (HCC)   Severe sepsis (HCC)   Chronic combined systolic and diastolic heart failure (HCC)   Chronic atrial fibrillation (HCC)   Sleep apnea   COPD, severe (HCC)  1. Acute on chronic respiratory failure with hypoxia patient will continue weaning for a 12-hour goal today on pressure support currently 20% FiO2 we'll continue aggressive pulmonary toilet supportive measures. 2. Severe sepsis resolved hemodynamics are stable 3. Chronic combined systolic and diastolic heart failure appears to be compensated we will monitor 4. Chronic atrial fibrillation rate is controlled 5. Severe COPD medical management 6. Sleep apnea nonissue right now   I have personally seen and evaluated the patient, evaluated laboratory and imaging results, formulated  the assessment and plan and placed orders. The Patient requires high complexity decision making with multiple systems involvement.  Rounds were done with the Respiratory Therapy Director and Staff therapists and discussed with nursing staff also.  Yevonne Pax, MD [facility] Pulmonary Critical Care Medicine Sleep Medicine

## 2020-04-06 DIAGNOSIS — U071 COVID-19: Secondary | ICD-10-CM | POA: Diagnosis not present

## 2020-04-06 DIAGNOSIS — J9621 Acute and chronic respiratory failure with hypoxia: Secondary | ICD-10-CM | POA: Diagnosis not present

## 2020-04-06 DIAGNOSIS — R6520 Severe sepsis without septic shock: Secondary | ICD-10-CM | POA: Diagnosis not present

## 2020-04-06 DIAGNOSIS — Z9911 Dependence on respirator [ventilator] status: Secondary | ICD-10-CM | POA: Diagnosis not present

## 2020-04-06 DIAGNOSIS — A419 Sepsis, unspecified organism: Secondary | ICD-10-CM | POA: Diagnosis not present

## 2020-04-06 DIAGNOSIS — J449 Chronic obstructive pulmonary disease, unspecified: Secondary | ICD-10-CM | POA: Diagnosis not present

## 2020-04-06 DIAGNOSIS — J9690 Respiratory failure, unspecified, unspecified whether with hypoxia or hypercapnia: Secondary | ICD-10-CM | POA: Diagnosis not present

## 2020-04-06 DIAGNOSIS — I5042 Chronic combined systolic (congestive) and diastolic (congestive) heart failure: Secondary | ICD-10-CM | POA: Diagnosis not present

## 2020-04-06 DIAGNOSIS — J1282 Pneumonia due to coronavirus disease 2019: Secondary | ICD-10-CM | POA: Diagnosis not present

## 2020-04-06 DIAGNOSIS — N179 Acute kidney failure, unspecified: Secondary | ICD-10-CM | POA: Diagnosis not present

## 2020-04-06 DIAGNOSIS — I482 Chronic atrial fibrillation, unspecified: Secondary | ICD-10-CM | POA: Diagnosis not present

## 2020-04-06 DIAGNOSIS — G4733 Obstructive sleep apnea (adult) (pediatric): Secondary | ICD-10-CM | POA: Diagnosis not present

## 2020-04-06 DIAGNOSIS — J40 Bronchitis, not specified as acute or chronic: Secondary | ICD-10-CM | POA: Diagnosis not present

## 2020-04-06 LAB — BASIC METABOLIC PANEL
Anion gap: 10 (ref 5–15)
BUN: 15 mg/dL (ref 8–23)
CO2: 28 mmol/L (ref 22–32)
Calcium: 8.3 mg/dL — ABNORMAL LOW (ref 8.9–10.3)
Chloride: 97 mmol/L — ABNORMAL LOW (ref 98–111)
Creatinine, Ser: 0.63 mg/dL (ref 0.61–1.24)
GFR calc Af Amer: >60 mL/min (ref >60)
GFR calc non Af Amer: >60 mL/min (ref >60)
Glucose, Bld: 165 mg/dL — ABNORMAL HIGH (ref 70–99)
Potassium: 4.6 mmol/L (ref 3.5–5.1)
Sodium: 135 mmol/L (ref 135–145)

## 2020-04-06 LAB — CBC
HCT: 34.2 % — ABNORMAL LOW (ref 39.0–52.0)
Hemoglobin: 10.3 g/dL — ABNORMAL LOW (ref 13.0–17.0)
MCH: 27.6 pg (ref 26.0–34.0)
MCHC: 30.1 g/dL (ref 30.0–36.0)
MCV: 91.7 fL (ref 80.0–100.0)
Platelets: 163 10*3/uL (ref 150–400)
RBC: 3.73 MIL/uL — ABNORMAL LOW (ref 4.22–5.81)
RDW: 15.9 % — ABNORMAL HIGH (ref 11.5–15.5)
WBC: 5 10*3/uL (ref 4.0–10.5)
nRBC: 0 % (ref 0.0–0.2)

## 2020-04-06 LAB — MAGNESIUM: Magnesium: 1.9 mg/dL (ref 1.7–2.4)

## 2020-04-06 LAB — PHOSPHORUS: Phosphorus: 3.3 mg/dL (ref 2.5–4.6)

## 2020-04-06 NOTE — Progress Notes (Addendum)
Pulmonary Critical Care Medicine [facility] [place]   PULMONARY CRITICAL CARE SERVICE  PROGRESS NOTE  Date of Service: 04/06/2020  Cody Hale  UVO:[id number]  DOB: Aug 21, 1950   DOA: 03/30/2020  Referring Physician: Carron Curie, MD  HPI: Cody Hale is a 69 y.o. male seen for follow up of Acute on Chronic Respiratory Failure.  Patient failed pressure support today remains on assist control mode rate of 15 with an FiO2 of 28% satting well this time no fever distress.  Medications: Reviewed on Rounds  Physical Exam:  Vitals: Pulse 49 respirations 18 BP 129/66 O2 sat 97% temp 98.5  Ventilator Settings ventilator mode AC VC rate 15 tidal volume 550 PEEP of 5 and FiO2 of 20%  . General: Comfortable at this time . Eyes: Grossly normal lids, irises & conjunctiva . ENT: grossly tongue is normal . Neck: no obvious mass . Cardiovascular: S1 S2 normal no gallop . Respiratory: Coarse breath sounds . Abdomen: soft . Skin: no rash seen on limited exam . Musculoskeletal: not rigid . Psychiatric:unable to assess . Neurologic: no seizure no involuntary movements         Lab Data:   Basic Metabolic Panel: Recent Labs  Lab 03/31/20 1351 04/04/20 0943 04/05/20 0053 04/06/20 0808  NA 143 134* 135 135  K 3.5 4.8 4.6 4.6  CL 109 98 100 97*  CO2 27 28 28 28   GLUCOSE 157* 162* 146* 165*  BUN 15 16 17 15   CREATININE 0.63 0.56* 0.55* 0.63  CALCIUM 8.0* 8.6* 8.4* 8.3*  MG 2.0 2.1 2.0 1.9  PHOS 3.3 3.1 3.8 3.3    ABG: Recent Labs  Lab 03/30/20 1644  PHART 7.538*  PCO2ART 31.6*  PO2ART 71.8*  HCO3 26.8  O2SAT 95.2    Liver Function Tests: Recent Labs  Lab 03/31/20 1351 04/05/20 0053  AST 18 19  ALT 16 16  ALKPHOS 71 83  BILITOT 0.4 0.6  PROT 5.8* 6.0*  ALBUMIN 2.2* 2.2*   No results for input(s): LIPASE, AMYLASE in the last 168 hours. No results for input(s): AMMONIA in the last 168 hours.  CBC: Recent Labs  Lab 03/31/20 1351 04/04/20 0943  04/05/20 0053 04/06/20 0808  WBC 5.1 5.5 4.9 5.0  NEUTROABS 4.0  --   --   --   HGB 10.5* 10.5* 10.1* 10.3*  HCT 35.5* 35.2* 33.9* 34.2*  MCV 94.4 91.7 92.6 91.7  PLT 155 177 152 163    Cardiac Enzymes: Recent Labs  Lab 04/05/20 0053  CKTOTAL 26*  CKMB 0.6    BNP (last 3 results) Recent Labs    05/06/19 1552 06/03/19 2149 04/05/20 0053  BNP 326.0* 368.9* 184.4*    ProBNP (last 3 results) No results for input(s): PROBNP in the last 8760 hours.  Radiological Exams: DG Chest Port 1 View  Result Date: 04/04/2020 CLINICAL DATA:  Respiratory failure. EXAM: PORTABLE CHEST 1 VIEW COMPARISON:  04/01/2023 FINDINGS: Endotracheal tube tip at the thoracic inlet. Enteric tube tip below the diaphragm not included in the field of view. Post median sternotomy. Stable cardiomegaly. Dense retrocardiac opacity is unchanged. Hazy right lung base opacity is unchanged. Mild vascular congestion. No pneumothorax. Remote bilateral rib fractures. IMPRESSION: 1. Unchanged cardiomegaly. Unchanged retrocardiac opacity which may represent airspace disease, pleural effusion or combination there of. Similar small right pleural effusion. 2. Slight vascular congestion. Electronically Signed   By: 04/06/2020 M.D.   On: 04/04/2020 23:37    Assessment/Plan Active Problems:   Acute on chronic  respiratory failure with hypoxia (HCC)   Severe sepsis (HCC)   Chronic combined systolic and diastolic heart failure (HCC)   Chronic atrial fibrillation (HCC)   Sleep apnea   COPD, severe (HCC)   1. Acute on chronic respiratory failure with hypoxia  patient will continue on full support at this time see settings above.  We will continue to wean to pressure support as tolerated. 2. Severe sepsis resolved hemodynamics are stable 3. Chronic combined systolic and diastolic heart failure appears to be compensated we will monitor 4. Chronic atrial fibrillation rate is controlled 5. Severe COPD medical  management 6. Sleep apnea nonissue right now   I have personally seen and evaluated the patient, evaluated laboratory and imaging results, formulated the assessment and plan and placed orders. The Patient requires high complexity decision making with multiple systems involvement.  Rounds were done with the Respiratory Therapy Director and Staff therapists and discussed with nursing staff also.  Yevonne Pax, MD [facility] Pulmonary Critical Care Medicine Sleep Medicine

## 2020-04-06 NOTE — Progress Notes (Signed)
PROGRESS NOTE    Cody Hale  UDJ:[id number] DOB: [birth year] DOA: 03/30/2020  Brief Narrative:  Cody Hale is an 69 y.o. male with medical history significant of combined systolic/diastolic congestive heart failure, bioprosthetic mitral valve placement of endocarditis in 2014, chronic atrial fibrillation on chronic anticoagulation, COPD, diabetes mellitus, history of T11/L4 fusion of the lumbar fracture 05/25/2019 with wound dehiscence noted 06/14/2019, foot wound, prior history of dysphagia requiring PEG tube placement on 06/04/2019, chronic hypoxemic respiratory failure who was admitted to [facility] on 03/16/2020 from [facility] [place] assisted living facility with acute on chronic hypoxemic respiratory failure.  Chest x-ray showed findings concerning for pneumonia.  He was started on ceftriaxone, azithromycin for community-acquired pneumonia.  His blood cultures showed group G Streptococcus and ceftriaxone was continued.  Patient underwent transthoracic echocardiogram with no overt findings of endocarditis.  Repeat blood cultures were negative.  Infectious disease recommended transesophageal echocardiogram.  However, patient had worsening respiratory status and required oxygen which precluded him from undergoing a TEE.  His hospital stay was complicated by acute on chronic hypoxemic respiratory failure.  Patient was placed on noninvasive ventilation with BiPAP.  However, he decompensated and had to be intubated on 03/21/2020.  Pulmonary was consulted and he underwent bedside bronchoscopy with suctioning of the mucous plug.  He had difficult time weaning from the ventilator therefore transferred to [facility] in [place].   -He remains orally intubated.  He is currently on 28% FiO2, 5 of PEEP. -Per primary team patient is having diarrhea.  Assessment & Plan: Acute on chronic respiratory failure with hypoxemia, ventilator dependent Pneumonia Sepsis with Streptococcus group G History  of bacterial endocarditis Bioprosthetic mitral valve replacement Diabetes mellitus type 2 Sacral pressure ulcer unspecified stage Right foot pressure ulcer Chronic atrial fibrillation Diarrhea Dysphagia/protein calorie malnutrition  Acute on chronic hypoxemic respiratory failure, ventilator dependent: Likely secondary to pneumonia.  At the acute facility he received treatment with IV ceftriaxone, azithromycin. However, he continued to have opacification of left hemithorax likely combination of effusion, atelectasis with mucous plugging.  He apparently failed pressure support and remains on assist control mode.  He is on 28% FiO2, PEEP of 5. -Continues on treatment with ceftriaxone for Streptococcus group G bacteremia.  Continue current management.  Pneumonia: As mentioned above he was already treated with ceftriaxone, azithromycin.  However, after that unfortunately continues to have mucous plugging, suspected aspiration with worsening respiratory failure.  Is currently intubated.  Remains on treatment with ceftriaxone.  Due to high suspicion for ongoing aspiration he is at risk for worsening respiratory failure despite being on antibiotics.  If his respiratory status worsens, would recommend repeat chest imaging preferably chest CT and repeat respiratory cultures. -Patient apparently has been mostly bedbound since his back surgery last year.  Because of this he is high risk for atelectasis/recurrent mucous plugging.  Sepsis with Streptococcus group G: Patient at the acute facility had leukocytosis, tachycardia, tachycardia and fevers along with confusion.  There was also high concern for endocarditis given his bioprosthetic mitral valve and prior history of bacterial endocarditis.  Transthoracic echo did not show any acute findings.  TEE was recommended but unfortunately due to his respiratory status TEE could not be done.  Currently on IV ceftriaxone.  He had blood cultures from 03/20/2020 which  have been negative.  Tentative end date for the ceftriaxone will be 04/30/2020 which would be 6 weeks. -Due to the need for prolonged antibiotics he is at risk for C. difficile.  -Per primary team  and nursing he started having diarrhea therefore started on p.o. vancomycin.  Will order stool for C. difficile. -If he starts having high fevers or worsening leukocytosis would recommend to send for repeat pancultures.  Continue to monitor closely. -Please monitor BUN/cr closely while on antibiotics.  Diabetes mellitus type 2: Continue to monitor Accu-Cheks, management of diabetes per primary team.  Sacral pressure ulcer unspecified stage, right foot pressure ulcer: Continue local wound care.  If the wound is worsening consider imaging and consult surgery.  Chronic atrial fibrillation: Continue medication and management per primary team.  Unfortunately due to his complex medical problems he is high risk for worsening and decompensation.    Subjective: He remains orally intubated.  He is opening eyes but not following commands.  Has hand mittens.  Objective: Vitals: Temperature 97.6, pulse 50, respiratory rate 20, blood pressure 134/54, pulse oximetry 99% on 28% FiO2, 5 PEEP.  Examination: Constitutional: Intubated, opening eyes but not following commands at this time Head: Atraumatic, normocephalic, pupils equal and reactive Eyes: PERLA ENMT: external ears and nose appear normal, Lips appears normal, orally intubated Neck: supple CVS: S1-S2, murmur Respiratory: occasional rhonchi, no wheezing Abdomen: obese, positive bowel sounds Musculoskeletal: no edema, feet with dressing in place Neuro: Intubated, opening eyes but not following commands.  Unable to do a neurologic exam at this time. Psych: Unable to assess at this time. Skin: no rashes    Data Reviewed: I have personally reviewed following labs and imaging studies  CBC: Recent Labs  Lab 03/31/20 1351 04/04/20 0943  04/05/20 0053 04/06/20 0808  WBC 5.1 5.5 4.9 5.0  NEUTROABS 4.0  --   --   --   HGB 10.5* 10.5* 10.1* 10.3*  HCT 35.5* 35.2* 33.9* 34.2*  MCV 94.4 91.7 92.6 91.7  PLT 155 177 152 163    Basic Metabolic Panel: Recent Labs  Lab 03/31/20 1351 04/04/20 0943 04/05/20 0053 04/06/20 0808  NA 143 134* 135 135  K 3.5 4.8 4.6 4.6  CL 109 98 100 97*  CO2 27 28 28 28   GLUCOSE 157* 162* 146* 165*  BUN 15 16 17 15   CREATININE 0.63 0.56* 0.55* 0.63  CALCIUM 8.0* 8.6* 8.4* 8.3*  MG 2.0 2.1 2.0 1.9  PHOS 3.3 3.1 3.8 3.3    GFR: Estimated Creatinine Clearance: 102.1 mL/min (by C-G formula based on SCr of 0.63 mg/dL).  Liver Function Tests: Recent Labs  Lab 03/31/20 1351 04/05/20 0053  AST 18 19  ALT 16 16  ALKPHOS 71 83  BILITOT 0.4 0.6  PROT 5.8* 6.0*  ALBUMIN 2.2* 2.2*    CBG: No results for input(s): GLUCAP in the last 168 hours.   No results found for this or any previous visit (from the past 240 hour(s)).     Radiology Studies: DG Chest Port 1 View  Result Date: 04/04/2020 CLINICAL DATA:  Respiratory failure. EXAM: PORTABLE CHEST 1 VIEW COMPARISON:  04/01/2023 FINDINGS: Endotracheal tube tip at the thoracic inlet. Enteric tube tip below the diaphragm not included in the field of view. Post median sternotomy. Stable cardiomegaly. Dense retrocardiac opacity is unchanged. Hazy right lung base opacity is unchanged. Mild vascular congestion. No pneumothorax. Remote bilateral rib fractures. IMPRESSION: 1. Unchanged cardiomegaly. Unchanged retrocardiac opacity which may represent airspace disease, pleural effusion or combination there of. Similar small right pleural effusion. 2. Slight vascular congestion. Electronically Signed   By: 04/06/2020 M.D.   On: 04/04/2020 23:37    Scheduled Meds: Please see MAR  Malanie Koloski,  MD  04/06/2020, 2:14 PM

## 2020-04-07 DIAGNOSIS — J9620 Acute and chronic respiratory failure, unspecified whether with hypoxia or hypercapnia: Secondary | ICD-10-CM | POA: Diagnosis not present

## 2020-04-07 DIAGNOSIS — J9621 Acute and chronic respiratory failure with hypoxia: Secondary | ICD-10-CM | POA: Diagnosis not present

## 2020-04-07 DIAGNOSIS — G4733 Obstructive sleep apnea (adult) (pediatric): Secondary | ICD-10-CM | POA: Diagnosis not present

## 2020-04-07 DIAGNOSIS — R6520 Severe sepsis without septic shock: Secondary | ICD-10-CM | POA: Diagnosis not present

## 2020-04-07 DIAGNOSIS — J40 Bronchitis, not specified as acute or chronic: Secondary | ICD-10-CM | POA: Diagnosis not present

## 2020-04-07 DIAGNOSIS — A419 Sepsis, unspecified organism: Secondary | ICD-10-CM | POA: Diagnosis not present

## 2020-04-07 DIAGNOSIS — I482 Chronic atrial fibrillation, unspecified: Secondary | ICD-10-CM | POA: Diagnosis not present

## 2020-04-07 DIAGNOSIS — J1282 Pneumonia due to coronavirus disease 2019: Secondary | ICD-10-CM | POA: Diagnosis not present

## 2020-04-07 DIAGNOSIS — U071 COVID-19: Secondary | ICD-10-CM | POA: Diagnosis not present

## 2020-04-07 DIAGNOSIS — Z9911 Dependence on respirator [ventilator] status: Secondary | ICD-10-CM | POA: Diagnosis not present

## 2020-04-07 DIAGNOSIS — I5042 Chronic combined systolic (congestive) and diastolic (congestive) heart failure: Secondary | ICD-10-CM | POA: Diagnosis not present

## 2020-04-07 DIAGNOSIS — J449 Chronic obstructive pulmonary disease, unspecified: Secondary | ICD-10-CM | POA: Diagnosis not present

## 2020-04-07 LAB — C DIFFICILE (CDIFF) QUICK SCRN (NO PCR REFLEX)
C Diff antigen: NEGATIVE
C Diff interpretation: NOT DETECTED
C Diff toxin: NEGATIVE

## 2020-04-07 NOTE — Progress Notes (Addendum)
Pulmonary Critical Care Medicine [facility] [place]   PULMONARY CRITICAL CARE SERVICE  PROGRESS NOTE  Date of Service: 04/07/2020  Cody Hale  EXH:[id number]  DOB: 03-Feb-1951   DOA: 03/30/2020  Referring Physician: Carron Curie, MD  HPI: Cody Hale is a 69 y.o. male seen for follow up of Acute on Chronic Respiratory Failure.  Patient continues on pressure support at this time for 12-hour goal currently on 20% FiO2 satting well no distress.  Medications: Reviewed on Rounds  Physical Exam:  Vitals: Pulse 80 respirations 19 BP 119/61 O2 sat 99% temp 97.8  Ventilator Settings per support 12/5 FiO2 28%  . General: Comfortable at this time . Eyes: Grossly normal lids, irises & conjunctiva . ENT: grossly tongue is normal . Neck: no obvious mass . Cardiovascular: S1 S2 normal no gallop . Respiratory: No rales or rhonchi noted . Abdomen: soft . Skin: no rash seen on limited exam . Musculoskeletal: not rigid . Psychiatric:unable to assess . Neurologic: no seizure no involuntary movements         Lab Data:   Basic Metabolic Panel: Recent Labs  Lab 03/31/20 1351 04/04/20 0943 04/05/20 0053 04/06/20 0808  NA 143 134* 135 135  K 3.5 4.8 4.6 4.6  CL 109 98 100 97*  CO2 27 28 28 28   GLUCOSE 157* 162* 146* 165*  BUN 15 16 17 15   CREATININE 0.63 0.56* 0.55* 0.63  CALCIUM 8.0* 8.6* 8.4* 8.3*  MG 2.0 2.1 2.0 1.9  PHOS 3.3 3.1 3.8 3.3    ABG: No results for input(s): PHART, PCO2ART, PO2ART, HCO3, O2SAT in the last 168 hours.  Liver Function Tests: Recent Labs  Lab 03/31/20 1351 04/05/20 0053  AST 18 19  ALT 16 16  ALKPHOS 71 83  BILITOT 0.4 0.6  PROT 5.8* 6.0*  ALBUMIN 2.2* 2.2*   No results for input(s): LIPASE, AMYLASE in the last 168 hours. No results for input(s): AMMONIA in the last 168 hours.  CBC: Recent Labs  Lab 03/31/20 1351 04/04/20 0943 04/05/20 0053 04/06/20 0808  WBC 5.1 5.5 4.9 5.0  NEUTROABS 4.0  --   --   --   HGB 10.5*  10.5* 10.1* 10.3*  HCT 35.5* 35.2* 33.9* 34.2*  MCV 94.4 91.7 92.6 91.7  PLT 155 177 152 163    Cardiac Enzymes: Recent Labs  Lab 04/05/20 0053  CKTOTAL 26*  CKMB 0.6    BNP (last 3 results) Recent Labs    05/06/19 1552 06/03/19 2149 04/05/20 0053  BNP 326.0* 368.9* 184.4*    ProBNP (last 3 results) No results for input(s): PROBNP in the last 8760 hours.  Radiological Exams: No results found.  Assessment/Plan Active Problems:   Acute on chronic respiratory failure with hypoxia (HCC)   Severe sepsis (HCC)   Chronic combined systolic and diastolic heart failure (HCC)   Chronic atrial fibrillation (HCC)   Sleep apnea   COPD, severe (HCC)   1. Acute on chronic respiratory failure with hypoxia  patient will continue to wean for goal of 12 hours on pressure support 12/5 FiO2 28% we will continue aggressive pulmonary toilet supportive measures. 2. Severe sepsis resolved hemodynamics are stable 3. Chronic combined systolic and diastolic heart failure appears to be compensated we will monitor 4. Chronic atrial fibrillation rate is controlled 5. Severe COPD medical management 6. Sleep apnea nonissue right now   I have personally seen and evaluated the patient, evaluated laboratory and imaging results, formulated the assessment and plan and placed orders.  The Patient requires high complexity decision making with multiple systems involvement.  Rounds were done with the Respiratory Therapy Director and Staff therapists and discussed with nursing staff also.  Allyne Gee, MD [facility] Pulmonary Critical Care Medicine Sleep Medicine

## 2020-04-08 DIAGNOSIS — J9621 Acute and chronic respiratory failure with hypoxia: Secondary | ICD-10-CM | POA: Diagnosis not present

## 2020-04-08 DIAGNOSIS — R6520 Severe sepsis without septic shock: Secondary | ICD-10-CM | POA: Diagnosis not present

## 2020-04-08 DIAGNOSIS — J9620 Acute and chronic respiratory failure, unspecified whether with hypoxia or hypercapnia: Secondary | ICD-10-CM | POA: Diagnosis not present

## 2020-04-08 DIAGNOSIS — I5042 Chronic combined systolic (congestive) and diastolic (congestive) heart failure: Secondary | ICD-10-CM | POA: Diagnosis not present

## 2020-04-08 DIAGNOSIS — U071 COVID-19: Secondary | ICD-10-CM | POA: Diagnosis not present

## 2020-04-08 DIAGNOSIS — Z9911 Dependence on respirator [ventilator] status: Secondary | ICD-10-CM | POA: Diagnosis not present

## 2020-04-08 DIAGNOSIS — J449 Chronic obstructive pulmonary disease, unspecified: Secondary | ICD-10-CM | POA: Diagnosis not present

## 2020-04-08 DIAGNOSIS — A419 Sepsis, unspecified organism: Secondary | ICD-10-CM | POA: Diagnosis not present

## 2020-04-08 DIAGNOSIS — I482 Chronic atrial fibrillation, unspecified: Secondary | ICD-10-CM | POA: Diagnosis not present

## 2020-04-08 DIAGNOSIS — G4733 Obstructive sleep apnea (adult) (pediatric): Secondary | ICD-10-CM | POA: Diagnosis not present

## 2020-04-08 DIAGNOSIS — J1282 Pneumonia due to coronavirus disease 2019: Secondary | ICD-10-CM | POA: Diagnosis not present

## 2020-04-08 DIAGNOSIS — J40 Bronchitis, not specified as acute or chronic: Secondary | ICD-10-CM | POA: Diagnosis not present

## 2020-04-08 NOTE — Progress Notes (Signed)
Pulmonary Critical Care Medicine [facility] [place]   PULMONARY CRITICAL CARE SERVICE  PROGRESS NOTE  Date of Service: 04/08/2020  Cody Hale  GXQ:[id number]  DOB: 1951-05-22   DOA: 03/30/2020  Referring Physician: Carron Curie, MD  HPI: Cody Hale is a 69 y.o. male seen for follow up of Acute on Chronic Respiratory Failure.  Patient is on pressure support mode currently on 28% FiO2 has been on a pressure of 12/5 goal today is 16 hours  Medications: Reviewed on Rounds  Physical Exam:  Vitals: Temperature is 98.5 pulse 54 respiratory rate is 15 blood pressure is 126/58 saturations 99%  Ventilator Settings on pressure support FiO2 is 28% pressure 12/5  . General: Comfortable at this time . Eyes: Grossly normal lids, irises & conjunctiva . ENT: grossly tongue is normal . Neck: no obvious mass . Cardiovascular: S1 S2 normal no gallop . Respiratory: No rhonchi very coarse breath sounds . Abdomen: soft . Skin: no rash seen on limited exam . Musculoskeletal: not rigid . Psychiatric:unable to assess . Neurologic: no seizure no involuntary movements         Lab Data:   Basic Metabolic Panel: Recent Labs  Lab 04/04/20 0943 04/05/20 0053 04/06/20 0808  NA 134* 135 135  K 4.8 4.6 4.6  CL 98 100 97*  CO2 28 28 28   GLUCOSE 162* 146* 165*  BUN 16 17 15   CREATININE 0.56* 0.55* 0.63  CALCIUM 8.6* 8.4* 8.3*  MG 2.1 2.0 1.9  PHOS 3.1 3.8 3.3    ABG: No results for input(s): PHART, PCO2ART, PO2ART, HCO3, O2SAT in the last 168 hours.  Liver Function Tests: Recent Labs  Lab 04/05/20 0053  AST 19  ALT 16  ALKPHOS 83  BILITOT 0.6  PROT 6.0*  ALBUMIN 2.2*   No results for input(s): LIPASE, AMYLASE in the last 168 hours. No results for input(s): AMMONIA in the last 168 hours.  CBC: Recent Labs  Lab 04/04/20 0943 04/05/20 0053 04/06/20 0808  WBC 5.5 4.9 5.0  HGB 10.5* 10.1* 10.3*  HCT 35.2* 33.9* 34.2*  MCV 91.7 92.6 91.7  PLT 177 152 163     Cardiac Enzymes: Recent Labs  Lab 04/05/20 0053  CKTOTAL 26*  CKMB 0.6    BNP (last 3 results) Recent Labs    05/06/19 1552 06/03/19 2149 04/05/20 0053  BNP 326.0* 368.9* 184.4*    ProBNP (last 3 results) No results for input(s): PROBNP in the last 8760 hours.  Radiological Exams: No results found.  Assessment/Plan Active Problems:   Acute on chronic respiratory failure with hypoxia (HCC)   Severe sepsis (HCC)   Chronic combined systolic and diastolic heart failure (HCC)   Chronic atrial fibrillation (HCC)   Sleep apnea   COPD, severe (HCC)   1. Acute on chronic respiratory failure with hypoxia we will continue to wean on pressure support the goal today is 16 hours 2. Severe sepsis resolved hemodynamics are stable 3. Chronic systolic and diastolic heart failure appears to be compensated 4. Chronic atrial fibrillation rate controlled 5. Sleep apnea nonissue right now 6. Severe COPD medical management   I have personally seen and evaluated the patient, evaluated laboratory and imaging results, formulated the assessment and plan and placed orders. The Patient requires high complexity decision making with multiple systems involvement.  Rounds were done with the Respiratory Therapy Director and Staff therapists and discussed with nursing staff also.  2150, MD [facility] Pulmonary Critical Care Medicine Sleep Medicine

## 2020-04-09 ENCOUNTER — Other Ambulatory Visit (HOSPITAL_COMMUNITY): Payer: Self-pay

## 2020-04-09 DIAGNOSIS — J40 Bronchitis, not specified as acute or chronic: Secondary | ICD-10-CM | POA: Diagnosis not present

## 2020-04-09 DIAGNOSIS — J9621 Acute and chronic respiratory failure with hypoxia: Secondary | ICD-10-CM | POA: Diagnosis not present

## 2020-04-09 DIAGNOSIS — J9811 Atelectasis: Secondary | ICD-10-CM | POA: Diagnosis not present

## 2020-04-09 DIAGNOSIS — J9690 Respiratory failure, unspecified, unspecified whether with hypoxia or hypercapnia: Secondary | ICD-10-CM | POA: Diagnosis not present

## 2020-04-09 DIAGNOSIS — J449 Chronic obstructive pulmonary disease, unspecified: Secondary | ICD-10-CM | POA: Diagnosis not present

## 2020-04-09 DIAGNOSIS — Z9911 Dependence on respirator [ventilator] status: Secondary | ICD-10-CM | POA: Diagnosis not present

## 2020-04-09 DIAGNOSIS — J90 Pleural effusion, not elsewhere classified: Secondary | ICD-10-CM | POA: Diagnosis not present

## 2020-04-09 DIAGNOSIS — A419 Sepsis, unspecified organism: Secondary | ICD-10-CM | POA: Diagnosis not present

## 2020-04-09 DIAGNOSIS — J1282 Pneumonia due to coronavirus disease 2019: Secondary | ICD-10-CM | POA: Diagnosis not present

## 2020-04-09 DIAGNOSIS — U071 COVID-19: Secondary | ICD-10-CM | POA: Diagnosis not present

## 2020-04-09 DIAGNOSIS — I482 Chronic atrial fibrillation, unspecified: Secondary | ICD-10-CM | POA: Diagnosis not present

## 2020-04-09 DIAGNOSIS — I5042 Chronic combined systolic (congestive) and diastolic (congestive) heart failure: Secondary | ICD-10-CM | POA: Diagnosis not present

## 2020-04-09 DIAGNOSIS — I517 Cardiomegaly: Secondary | ICD-10-CM | POA: Diagnosis not present

## 2020-04-09 DIAGNOSIS — G4733 Obstructive sleep apnea (adult) (pediatric): Secondary | ICD-10-CM | POA: Diagnosis not present

## 2020-04-09 DIAGNOSIS — R6520 Severe sepsis without septic shock: Secondary | ICD-10-CM | POA: Diagnosis not present

## 2020-04-09 LAB — BASIC METABOLIC PANEL
Anion gap: 9 (ref 5–15)
BUN: 14 mg/dL (ref 8–23)
CO2: 27 mmol/L (ref 22–32)
Calcium: 8.5 mg/dL — ABNORMAL LOW (ref 8.9–10.3)
Chloride: 99 mmol/L (ref 98–111)
Creatinine, Ser: 0.6 mg/dL — ABNORMAL LOW (ref 0.61–1.24)
GFR calc Af Amer: >60 mL/min (ref >60)
GFR calc non Af Amer: >60 mL/min (ref >60)
Glucose, Bld: 129 mg/dL — ABNORMAL HIGH (ref 70–99)
Potassium: 4.4 mmol/L (ref 3.5–5.1)
Sodium: 135 mmol/L (ref 135–145)

## 2020-04-09 LAB — CBC
HCT: 34.5 % — ABNORMAL LOW (ref 39.0–52.0)
Hemoglobin: 10.4 g/dL — ABNORMAL LOW (ref 13.0–17.0)
MCH: 27.6 pg (ref 26.0–34.0)
MCHC: 30.1 g/dL (ref 30.0–36.0)
MCV: 91.5 fL (ref 80.0–100.0)
Platelets: 161 10*3/uL (ref 150–400)
RBC: 3.77 MIL/uL — ABNORMAL LOW (ref 4.22–5.81)
RDW: 15.7 % — ABNORMAL HIGH (ref 11.5–15.5)
WBC: 3.8 10*3/uL — ABNORMAL LOW (ref 4.0–10.5)
nRBC: 0 % (ref 0.0–0.2)

## 2020-04-09 LAB — PHOSPHORUS: Phosphorus: 3.1 mg/dL (ref 2.5–4.6)

## 2020-04-09 LAB — MAGNESIUM: Magnesium: 1.9 mg/dL (ref 1.7–2.4)

## 2020-04-09 NOTE — Progress Notes (Signed)
Pulmonary Critical Care Medicine [facility] [place]   PULMONARY CRITICAL CARE SERVICE  PROGRESS NOTE  Date of Service: 04/09/2020  Cody Hale  [id number]  DOB: February 21, 1951   DOA: 03/30/2020  Referring Physician: Carron Curie, MD  HPI: Cody Hale is a 69 y.o. male seen for follow up of Acute on Chronic Respiratory Failure.  Patient remains on pressure support has been on 28% FiO2 good saturations are noted.  Medications: Reviewed on Rounds  Physical Exam:  Vitals: Temperature is 98.7 pulse 52 respiratory rate 16 blood pressure is 129/68 saturations 99%  Ventilator Settings on pressure support FiO2 is 20% pressure 12/5  . General: Comfortable at this time . Eyes: Grossly normal lids, irises & conjunctiva . ENT: grossly tongue is normal . Neck: no obvious mass . Cardiovascular: S1 S2 normal no gallop . Respiratory: No rhonchi very coarse breath sounds . Abdomen: soft . Skin: no rash seen on limited exam . Musculoskeletal: not rigid . Psychiatric:unable to assess . Neurologic: no seizure no involuntary movements         Lab Data:   Basic Metabolic Panel: Recent Labs  Lab 04/04/20 0943 04/05/20 0053 04/06/20 0808 04/09/20 0348  NA 134* 135 135 135  K 4.8 4.6 4.6 4.4  CL 98 100 97* 99  CO2 28 28 28 27   GLUCOSE 162* 146* 165* 129*  BUN 16 17 15 14   CREATININE 0.56* 0.55* 0.63 0.60*  CALCIUM 8.6* 8.4* 8.3* 8.5*  MG 2.1 2.0 1.9 1.9  PHOS 3.1 3.8 3.3 3.1    ABG: No results for input(s): PHART, PCO2ART, PO2ART, HCO3, O2SAT in the last 168 hours.  Liver Function Tests: Recent Labs  Lab 04/05/20 0053  AST 19  ALT 16  ALKPHOS 83  BILITOT 0.6  PROT 6.0*  ALBUMIN 2.2*   No results for input(s): LIPASE, AMYLASE in the last 168 hours. No results for input(s): AMMONIA in the last 168 hours.  CBC: Recent Labs  Lab 04/04/20 0943 04/05/20 0053 04/06/20 0808 04/09/20 0348  WBC 5.5 4.9 5.0 3.8*  HGB 10.5* 10.1* 10.3* 10.4*  HCT 35.2* 33.9*  34.2* 34.5*  MCV 91.7 92.6 91.7 91.5  PLT 177 152 163 161    Cardiac Enzymes: Recent Labs  Lab 04/05/20 0053  CKTOTAL 26*  CKMB 0.6    BNP (last 3 results) Recent Labs    05/06/19 1552 06/03/19 2149 04/05/20 0053  BNP 326.0* 368.9* 184.4*    ProBNP (last 3 results) No results for input(s): PROBNP in the last 8760 hours.  Radiological Exams: DG CHEST PORT 1 VIEW  Result Date: 04/09/2020 CLINICAL DATA:  Intubated EXAM: PORTABLE CHEST 1 VIEW COMPARISON:  04/04/2020 chest radiograph. FINDINGS: Endotracheal tube tip is 5.6 cm above the carina. Enteric tube enters stomach with the tip not seen on this image. Intact sternotomy wires. Stable cardiomediastinal silhouette with mild cardiomegaly. No pneumothorax. Probable stable small left pleural effusion. No significant right pleural effusion. No overt pulmonary edema. Left retrocardiac opacity is unchanged. IMPRESSION: 1. Well-positioned support structures. 2. Stable mild cardiomegaly without overt pulmonary edema. 3. Stable small left pleural effusion and left retrocardiac lung opacity, favor atelectasis. Electronically Signed   By: 06/09/2020 M.D.   On: 04/09/2020 05:30    Assessment/Plan Active Problems:   Acute on chronic respiratory failure with hypoxia (HCC)   Severe sepsis (HCC)   Chronic combined systolic and diastolic heart failure (HCC)   Chronic atrial fibrillation (HCC)   Sleep apnea   COPD, severe (HCC)  1. Acute on chronic respiratory failure with hypoxia we will continue pressure support titrate oxygen continue pulmonary toilet. 2. Severe sepsis resolved hemodynamics are stable. 3. Chronic combined systolic diastolic heart failure at baseline we will continue to follow 4. Chronic atrial fibrillation rate is controlled 5. Sleep apnea nonissue right now 6. Severe COPD medical management   I have personally seen and evaluated the patient, evaluated laboratory and imaging results, formulated the assessment and  plan and placed orders. The Patient requires high complexity decision making with multiple systems involvement.  Rounds were done with the Respiratory Therapy Director and Staff therapists and discussed with nursing staff also.  Yevonne Pax, MD [facility] Pulmonary Critical Care Medicine Sleep Medicine

## 2020-04-10 DIAGNOSIS — J449 Chronic obstructive pulmonary disease, unspecified: Secondary | ICD-10-CM | POA: Diagnosis not present

## 2020-04-10 DIAGNOSIS — Z9911 Dependence on respirator [ventilator] status: Secondary | ICD-10-CM | POA: Diagnosis not present

## 2020-04-10 DIAGNOSIS — I482 Chronic atrial fibrillation, unspecified: Secondary | ICD-10-CM | POA: Diagnosis not present

## 2020-04-10 DIAGNOSIS — G4733 Obstructive sleep apnea (adult) (pediatric): Secondary | ICD-10-CM | POA: Diagnosis not present

## 2020-04-10 DIAGNOSIS — J9621 Acute and chronic respiratory failure with hypoxia: Secondary | ICD-10-CM | POA: Diagnosis not present

## 2020-04-10 DIAGNOSIS — J9620 Acute and chronic respiratory failure, unspecified whether with hypoxia or hypercapnia: Secondary | ICD-10-CM | POA: Diagnosis not present

## 2020-04-10 DIAGNOSIS — I5042 Chronic combined systolic (congestive) and diastolic (congestive) heart failure: Secondary | ICD-10-CM | POA: Diagnosis not present

## 2020-04-10 DIAGNOSIS — R6520 Severe sepsis without septic shock: Secondary | ICD-10-CM | POA: Diagnosis not present

## 2020-04-10 DIAGNOSIS — J40 Bronchitis, not specified as acute or chronic: Secondary | ICD-10-CM | POA: Diagnosis not present

## 2020-04-10 DIAGNOSIS — A419 Sepsis, unspecified organism: Secondary | ICD-10-CM | POA: Diagnosis not present

## 2020-04-10 DIAGNOSIS — U071 COVID-19: Secondary | ICD-10-CM | POA: Diagnosis not present

## 2020-04-10 DIAGNOSIS — J1282 Pneumonia due to coronavirus disease 2019: Secondary | ICD-10-CM | POA: Diagnosis not present

## 2020-04-10 NOTE — Progress Notes (Signed)
Pulmonary Critical Care Medicine [facility] [place]   PULMONARY CRITICAL CARE SERVICE  PROGRESS NOTE  Date of Service: 04/10/2020  Cody Hale  VFI:[id number]  DOB: August 11, 1950   DOA: 03/30/2020  Referring Physician: Carron Curie, MD  HPI: Cody Hale is a 69 y.o. male seen for follow up of Acute on Chronic Respiratory Failure.  Patient at this time is on pressure support currently on 28% FiO2 with a pressure of 12/5  Medications: Reviewed on Rounds  Physical Exam:  Vitals: Temperature is 98.6 pulse 45 respiratory rate 16 blood pressure is 151/63 saturations 98%  Ventilator Settings on pressure support FiO2 is 28% pressure 12/5  . General: Comfortable at this time . Eyes: Grossly normal lids, irises & conjunctiva . ENT: grossly tongue is normal . Neck: no obvious mass . Cardiovascular: S1 S2 normal no gallop . Respiratory: No rhonchi no rales are noted at this time . Abdomen: soft . Skin: no rash seen on limited exam . Musculoskeletal: not rigid . Psychiatric:unable to assess . Neurologic: no seizure no involuntary movements         Lab Data:   Basic Metabolic Panel: Recent Labs  Lab 04/04/20 0943 04/05/20 0053 04/06/20 0808 04/09/20 0348  NA 134* 135 135 135  K 4.8 4.6 4.6 4.4  CL 98 100 97* 99  CO2 28 28 28 27   GLUCOSE 162* 146* 165* 129*  BUN 16 17 15 14   CREATININE 0.56* 0.55* 0.63 0.60*  CALCIUM 8.6* 8.4* 8.3* 8.5*  MG 2.1 2.0 1.9 1.9  PHOS 3.1 3.8 3.3 3.1    ABG: No results for input(s): PHART, PCO2ART, PO2ART, HCO3, O2SAT in the last 168 hours.  Liver Function Tests: Recent Labs  Lab 04/05/20 0053  AST 19  ALT 16  ALKPHOS 83  BILITOT 0.6  PROT 6.0*  ALBUMIN 2.2*   No results for input(s): LIPASE, AMYLASE in the last 168 hours. No results for input(s): AMMONIA in the last 168 hours.  CBC: Recent Labs  Lab 04/04/20 0943 04/05/20 0053 04/06/20 0808 04/09/20 0348  WBC 5.5 4.9 5.0 3.8*  HGB 10.5* 10.1* 10.3* 10.4*  HCT  35.2* 33.9* 34.2* 34.5*  MCV 91.7 92.6 91.7 91.5  PLT 177 152 163 161    Cardiac Enzymes: Recent Labs  Lab 04/05/20 0053  CKTOTAL 26*  CKMB 0.6    BNP (last 3 results) Recent Labs    05/06/19 1552 06/03/19 2149 04/05/20 0053  BNP 326.0* 368.9* 184.4*    ProBNP (last 3 results) No results for input(s): PROBNP in the last 8760 hours.  Radiological Exams: DG CHEST PORT 1 VIEW  Result Date: 04/09/2020 CLINICAL DATA:  Intubated EXAM: PORTABLE CHEST 1 VIEW COMPARISON:  04/04/2020 chest radiograph. FINDINGS: Endotracheal tube tip is 5.6 cm above the carina. Enteric tube enters stomach with the tip not seen on this image. Intact sternotomy wires. Stable cardiomediastinal silhouette with mild cardiomegaly. No pneumothorax. Probable stable small left pleural effusion. No significant right pleural effusion. No overt pulmonary edema. Left retrocardiac opacity is unchanged. IMPRESSION: 1. Well-positioned support structures. 2. Stable mild cardiomegaly without overt pulmonary edema. 3. Stable small left pleural effusion and left retrocardiac lung opacity, favor atelectasis. Electronically Signed   By: 06/09/2020 M.D.   On: 04/09/2020 05:30    Assessment/Plan Active Problems:   Acute on chronic respiratory failure with hypoxia (HCC)   Severe sepsis (HCC)   Chronic combined systolic and diastolic heart failure (HCC)   Chronic atrial fibrillation (HCC)   Sleep apnea  COPD, severe (HCC)   1. Acute on chronic respiratory failure with hypoxia we will continue with the pressure support wean titrate oxygen continue pulmonary toilet. 2. Severe sepsis resolved hemodynamics are stable. 3. Chronic combined systolic diastolic heart failure compensated monitor fluid status closely. 4. Chronic atrial fibrillation rate is controlled 5. Sleep apnea no change 6. Severe COPD at baseline   I have personally seen and evaluated the patient, evaluated laboratory and imaging results, formulated the  assessment and plan and placed orders. The Patient requires high complexity decision making with multiple systems involvement.  Rounds were done with the Respiratory Therapy Director and Staff therapists and discussed with nursing staff also.  Yevonne Pax, MD [facility] Pulmonary Critical Care Medicine Sleep Medicine

## 2020-04-11 ENCOUNTER — Other Ambulatory Visit (HOSPITAL_COMMUNITY): Payer: Self-pay

## 2020-04-11 DIAGNOSIS — J9621 Acute and chronic respiratory failure with hypoxia: Secondary | ICD-10-CM | POA: Diagnosis not present

## 2020-04-11 DIAGNOSIS — J9620 Acute and chronic respiratory failure, unspecified whether with hypoxia or hypercapnia: Secondary | ICD-10-CM | POA: Diagnosis not present

## 2020-04-11 DIAGNOSIS — J40 Bronchitis, not specified as acute or chronic: Secondary | ICD-10-CM | POA: Diagnosis not present

## 2020-04-11 DIAGNOSIS — K6389 Other specified diseases of intestine: Secondary | ICD-10-CM | POA: Diagnosis not present

## 2020-04-11 DIAGNOSIS — I5042 Chronic combined systolic (congestive) and diastolic (congestive) heart failure: Secondary | ICD-10-CM | POA: Diagnosis not present

## 2020-04-11 DIAGNOSIS — U071 COVID-19: Secondary | ICD-10-CM | POA: Diagnosis not present

## 2020-04-11 DIAGNOSIS — Z9911 Dependence on respirator [ventilator] status: Secondary | ICD-10-CM | POA: Diagnosis not present

## 2020-04-11 DIAGNOSIS — A419 Sepsis, unspecified organism: Secondary | ICD-10-CM | POA: Diagnosis not present

## 2020-04-11 DIAGNOSIS — G4733 Obstructive sleep apnea (adult) (pediatric): Secondary | ICD-10-CM | POA: Diagnosis not present

## 2020-04-11 DIAGNOSIS — Z4682 Encounter for fitting and adjustment of non-vascular catheter: Secondary | ICD-10-CM | POA: Diagnosis not present

## 2020-04-11 DIAGNOSIS — J449 Chronic obstructive pulmonary disease, unspecified: Secondary | ICD-10-CM | POA: Diagnosis not present

## 2020-04-11 DIAGNOSIS — R6520 Severe sepsis without septic shock: Secondary | ICD-10-CM | POA: Diagnosis not present

## 2020-04-11 DIAGNOSIS — J1282 Pneumonia due to coronavirus disease 2019: Secondary | ICD-10-CM | POA: Diagnosis not present

## 2020-04-11 DIAGNOSIS — I482 Chronic atrial fibrillation, unspecified: Secondary | ICD-10-CM | POA: Diagnosis not present

## 2020-04-11 NOTE — Progress Notes (Addendum)
Pulmonary Critical Care Medicine [facility] [place]   PULMONARY CRITICAL CARE SERVICE  PROGRESS NOTE  Date of Service: 04/11/2020  Cody Hale  HQP:[id number]  DOB: 1951/02/28   DOA: 03/30/2020  Referring Physician: Carron Curie, MD  HPI: Cody Hale is a 69 y.o. male seen for follow up of Acute on Chronic Respiratory Failure.  Patient remains on BiPAP at this time 14/6 with FiO2 28% was extubated yesterday and the plan is to stay on BiPAP for 24 hours total and then wean as tolerated.  Currently satting well no distress.  Medications: Reviewed on Rounds  Physical Exam:  Vitals: Pulse 82 respiration 20 BP 164/63 O2 sat 99% temp 96.6  Ventilator Settings BiPAP 14/6 with FiO2 28%  . General: Comfortable at this time . Eyes: Grossly normal lids, irises & conjunctiva . ENT: grossly tongue is normal . Neck: no obvious mass . Cardiovascular: S1 S2 normal no gallop . Respiratory: No rales or rhonchi noted . Abdomen: soft . Skin: no rash seen on limited exam . Musculoskeletal: not rigid . Psychiatric:unable to assess . Neurologic: no seizure no involuntary movements         Lab Data:   Basic Metabolic Panel: Recent Labs  Lab 04/05/20 0053 04/06/20 0808 04/09/20 0348  NA 135 135 135  K 4.6 4.6 4.4  CL 100 97* 99  CO2 28 28 27   GLUCOSE 146* 165* 129*  BUN 17 15 14   CREATININE 0.55* 0.63 0.60*  CALCIUM 8.4* 8.3* 8.5*  MG 2.0 1.9 1.9  PHOS 3.8 3.3 3.1    ABG: No results for input(s): PHART, PCO2ART, PO2ART, HCO3, O2SAT in the last 168 hours.  Liver Function Tests: Recent Labs  Lab 04/05/20 0053  AST 19  ALT 16  ALKPHOS 83  BILITOT 0.6  PROT 6.0*  ALBUMIN 2.2*   No results for input(s): LIPASE, AMYLASE in the last 168 hours. No results for input(s): AMMONIA in the last 168 hours.  CBC: Recent Labs  Lab 04/05/20 0053 04/06/20 0808 04/09/20 0348  WBC 4.9 5.0 3.8*  HGB 10.1* 10.3* 10.4*  HCT 33.9* 34.2* 34.5*  MCV 92.6 91.7 91.5  PLT 152  163 161    Cardiac Enzymes: Recent Labs  Lab 04/05/20 0053  CKTOTAL 26*  CKMB 0.6    BNP (last 3 results) Recent Labs    05/06/19 1552 06/03/19 2149 04/05/20 0053  BNP 326.0* 368.9* 184.4*    ProBNP (last 3 results) No results for input(s): PROBNP in the last 8760 hours.  Radiological Exams: No results found.  Assessment/Plan Active Problems:   Acute on chronic respiratory failure with hypoxia (HCC)   Severe sepsis (HCC)   Chronic combined systolic and diastolic heart failure (HCC)   Chronic atrial fibrillation (HCC)   Sleep apnea   COPD, severe (HCC)   1. Acute on chronic respiratory failure with hypoxia we will continue with BiPAP at this time with plans to wean this afternoon.  Continue supportive measures and pulmonary toilet. 2. Severe sepsis resolved hemodynamics are stable. 3. Chronic combined systolic diastolic heart failure compensated monitor fluid status closely. 4. Chronic atrial fibrillation rate is controlled 5. Sleep apnea no change 6. Severe COPD at baseline   I have personally seen and evaluated the patient, evaluated laboratory and imaging results, formulated the assessment and plan and placed orders. The Patient requires high complexity decision making with multiple systems involvement.  Rounds were done with the Respiratory Therapy Director and Staff therapists and discussed with nursing staff also.  Allyne Gee, MD [facility] Pulmonary Critical Care Medicine Sleep Medicine

## 2020-04-11 NOTE — Consult Note (Signed)
Ref: Shayne Alken, MD   Subjective:  Low heart rate. Asymptomatic. Good blood pressure. Off beta-blocker for long time. Bed ridden. Heart rate improves to 50's with 1/2 minute of bending knees several times.  Objective:  Vital Signs in the last 24 hours:  HR: 37, R: 18, BP: 150/87. O2 sat is 97 % on RA.  Physical Exam: BP Readings from Last 1 Encounters:  03/30/20 133/64     Wt Readings from Last 1 Encounters:  03/30/20 100.9 kg    Weight change:  There is no height or weight on file to calculate BMI. HEENT: [place]/AT, Eyes-Blue, PERL, EOMI, Conjunctiva-Pale pink, Sclera-Non-icteric Neck: No JVD, No bruit, Trachea midline. Lungs:  Clearing, Bilateral. Cardiac:  Regular rhythm, normal S1 and S2, no S3. II/VI systolic murmur. Abdomen:  Soft, non-tender. BS present. Extremities:  Trace edema present. No cyanosis. No clubbing. CNS: AxOx2, Cranial nerves grossly intact, moves all 4 extremities.  Skin: Warm and dry.   Intake/Output from previous day: No intake/output data recorded.    Lab Results: BMET    Component Value Date/Time   NA 135 04/09/2020 0348   NA 135 04/06/2020 0808   NA 135 04/05/2020 0053   K 4.4 04/09/2020 0348   K 4.6 04/06/2020 0808   K 4.6 04/05/2020 0053   CL 99 04/09/2020 0348   CL 97 (L) 04/06/2020 0808   CL 100 04/05/2020 0053   CO2 27 04/09/2020 0348   CO2 28 04/06/2020 0808   CO2 28 04/05/2020 0053   GLUCOSE 129 (H) 04/09/2020 0348   GLUCOSE 165 (H) 04/06/2020 0808   GLUCOSE 146 (H) 04/05/2020 0053   BUN 14 04/09/2020 0348   BUN 15 04/06/2020 0808   BUN 17 04/05/2020 0053   CREATININE 0.60 (L) 04/09/2020 0348   CREATININE 0.63 04/06/2020 0808   CREATININE 0.55 (L) 04/05/2020 0053   CREATININE 1.07 03/01/2020 1507   CREATININE 1.17 12/23/2017 0714   CREATININE 1.33 (H) 12/15/2017 0759   CALCIUM 8.5 (L) 04/09/2020 0348   CALCIUM 8.3 (L) 04/06/2020 0808   CALCIUM 8.4 (L) 04/05/2020 0053   GFRNONAA >60 04/09/2020 0348    GFRNONAA >60 04/06/2020 0808   GFRNONAA >60 04/05/2020 0053   GFRNONAA 70 03/01/2020 1507   GFRAA >60 04/09/2020 0348   GFRAA >60 04/06/2020 0808   GFRAA >60 04/05/2020 0053   GFRAA 82 03/01/2020 1507   CBC    Component Value Date/Time   WBC 3.8 (L) 04/09/2020 0348   RBC 3.77 (L) 04/09/2020 0348   HGB 10.4 (L) 04/09/2020 0348   HCT 34.5 (L) 04/09/2020 0348   PLT 161 04/09/2020 0348   MCV 91.5 04/09/2020 0348   MCH 27.6 04/09/2020 0348   MCHC 30.1 04/09/2020 0348   RDW 15.7 (H) 04/09/2020 0348   LYMPHSABS 0.5 (L) 03/31/2020 1351   MONOABS 0.4 03/31/2020 1351   EOSABS 0.1 03/31/2020 1351   BASOSABS 0.0 03/31/2020 1351   HEPATIC Function Panel Recent Labs    03/27/20 0559 03/31/20 1351 04/05/20 0053  PROT 5.6* 5.8* 6.0*   HEMOGLOBIN A1C No components found for: HGA1C,  MPG CARDIAC ENZYMES Lab Results  Component Value Date   CKTOTAL 26 (L) 04/05/2020   CKMB 0.6 04/05/2020   TROPONINI 0.03 (HH) 05/19/2018   TROPONINI 0.03 (HH) 05/19/2018   TROPONINI 0.04 (HH) 05/16/2018   BNP No results for input(s): PROBNP in the last 8760 hours. TSH Recent Labs    05/24/19 1808 03/01/20 1507  TSH 0.485 1.14   CHOLESTEROL  Recent Labs    03/01/20 1507  CHOL 77    Scheduled Meds: Continuous Infusions: PRN Meds:.  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 Continue antibiotics.  IV fluids as needed.  Continue medical treatment. Agree with increasing hydralaz    LOS: 0 days   Time spent including chart review, lab review, examination, discussion with patient PA, Tech and nurse : 30 min   Orpah Cobb  MD  04/11/2020, 1:36 PM

## 2020-04-12 ENCOUNTER — Other Ambulatory Visit (HOSPITAL_COMMUNITY): Payer: Self-pay

## 2020-04-12 DIAGNOSIS — Z9911 Dependence on respirator [ventilator] status: Secondary | ICD-10-CM | POA: Diagnosis not present

## 2020-04-12 DIAGNOSIS — J40 Bronchitis, not specified as acute or chronic: Secondary | ICD-10-CM | POA: Diagnosis not present

## 2020-04-12 DIAGNOSIS — J9620 Acute and chronic respiratory failure, unspecified whether with hypoxia or hypercapnia: Secondary | ICD-10-CM | POA: Diagnosis not present

## 2020-04-12 DIAGNOSIS — J449 Chronic obstructive pulmonary disease, unspecified: Secondary | ICD-10-CM | POA: Diagnosis not present

## 2020-04-12 DIAGNOSIS — G4733 Obstructive sleep apnea (adult) (pediatric): Secondary | ICD-10-CM | POA: Diagnosis not present

## 2020-04-12 DIAGNOSIS — J9621 Acute and chronic respiratory failure with hypoxia: Secondary | ICD-10-CM | POA: Diagnosis not present

## 2020-04-12 DIAGNOSIS — I482 Chronic atrial fibrillation, unspecified: Secondary | ICD-10-CM | POA: Diagnosis not present

## 2020-04-12 DIAGNOSIS — I5042 Chronic combined systolic (congestive) and diastolic (congestive) heart failure: Secondary | ICD-10-CM | POA: Diagnosis not present

## 2020-04-12 DIAGNOSIS — R6520 Severe sepsis without septic shock: Secondary | ICD-10-CM | POA: Diagnosis not present

## 2020-04-12 DIAGNOSIS — U071 COVID-19: Secondary | ICD-10-CM | POA: Diagnosis not present

## 2020-04-12 DIAGNOSIS — A419 Sepsis, unspecified organism: Secondary | ICD-10-CM | POA: Diagnosis not present

## 2020-04-12 DIAGNOSIS — J1282 Pneumonia due to coronavirus disease 2019: Secondary | ICD-10-CM | POA: Diagnosis not present

## 2020-04-12 LAB — RENAL FUNCTION PANEL
Albumin: 2.5 g/dL — ABNORMAL LOW (ref 3.5–5.0)
Anion gap: 9 (ref 5–15)
BUN: 7 mg/dL — ABNORMAL LOW (ref 8–23)
CO2: 27 mmol/L (ref 22–32)
Calcium: 8.5 mg/dL — ABNORMAL LOW (ref 8.9–10.3)
Chloride: 100 mmol/L (ref 98–111)
Creatinine, Ser: 0.51 mg/dL — ABNORMAL LOW (ref 0.61–1.24)
GFR calc non Af Amer: >60 mL/min (ref >60)
Glucose, Bld: 110 mg/dL — ABNORMAL HIGH (ref 70–99)
Phosphorus: 3.3 mg/dL (ref 2.5–4.6)
Potassium: 4.2 mmol/L (ref 3.5–5.1)
Sodium: 136 mmol/L (ref 135–145)

## 2020-04-12 LAB — CBC
HCT: 36.8 % — ABNORMAL LOW (ref 39.0–52.0)
Hemoglobin: 10.9 g/dL — ABNORMAL LOW (ref 13.0–17.0)
MCH: 27.4 pg (ref 26.0–34.0)
MCHC: 29.6 g/dL — ABNORMAL LOW (ref 30.0–36.0)
MCV: 92.5 fL (ref 80.0–100.0)
Platelets: 148 10*3/uL — ABNORMAL LOW (ref 150–400)
RBC: 3.98 MIL/uL — ABNORMAL LOW (ref 4.22–5.81)
RDW: 15.9 % — ABNORMAL HIGH (ref 11.5–15.5)
WBC: 3.4 10*3/uL — ABNORMAL LOW (ref 4.0–10.5)
nRBC: 0 % (ref 0.0–0.2)

## 2020-04-12 LAB — MAGNESIUM: Magnesium: 1.7 mg/dL (ref 1.7–2.4)

## 2020-04-12 NOTE — Progress Notes (Addendum)
Pulmonary Critical Care Medicine [facility] [place]   PULMONARY CRITICAL CARE SERVICE  PROGRESS NOTE  Date of Service: 04/12/2020  Cody Hale  [id number]  DOB: 1951-05-26   DOA: 03/30/2020  Referring Physician: Carron Curie, MD  HPI: Cody Hale is a 69 y.o. male seen for follow up of Acute on Chronic Respiratory Failure.  Patient's BiPAP was discontinued last evening was placed on 2 L nasal cannula and is now down to 1 L nasal cannula post extubation satting well no fever distress.  Medications: Reviewed on Rounds  Physical Exam:  Vitals: Pulse 71 respirations 31 BP 124/67 O2 sat 95% temp 97.6  Ventilator Settings not currently on ventilator  . General: Comfortable at this time . Eyes: Grossly normal lids, irises & conjunctiva . ENT: grossly tongue is normal . Neck: no obvious mass . Cardiovascular: S1 S2 normal no gallop . Respiratory: No rales or rhonchi noted . Abdomen: soft . Skin: no rash seen on limited exam . Musculoskeletal: not rigid . Psychiatric:unable to assess . Neurologic: no seizure no involuntary movements         Lab Data:   Basic Metabolic Panel: Recent Labs  Lab 04/06/20 0808 04/09/20 0348  NA 135 135  K 4.6 4.4  CL 97* 99  CO2 28 27  GLUCOSE 165* 129*  BUN 15 14  CREATININE 0.63 0.60*  CALCIUM 8.3* 8.5*  MG 1.9 1.9  PHOS 3.3 3.1    ABG: No results for input(s): PHART, PCO2ART, PO2ART, HCO3, O2SAT in the last 168 hours.  Liver Function Tests: No results for input(s): AST, ALT, ALKPHOS, BILITOT, PROT, ALBUMIN in the last 168 hours. No results for input(s): LIPASE, AMYLASE in the last 168 hours. No results for input(s): AMMONIA in the last 168 hours.  CBC: Recent Labs  Lab 04/06/20 0808 04/09/20 0348  WBC 5.0 3.8*  HGB 10.3* 10.4*  HCT 34.2* 34.5*  MCV 91.7 91.5  PLT 163 161    Cardiac Enzymes: No results for input(s): CKTOTAL, CKMB, CKMBINDEX, TROPONINI in the last 168 hours.  BNP (last 3  results) Recent Labs    05/06/19 1552 06/03/19 2149 04/05/20 0053  BNP 326.0* 368.9* 184.4*    ProBNP (last 3 results) No results for input(s): PROBNP in the last 8760 hours.  Radiological Exams: DG Abd 1 View  Result Date: 04/11/2020 CLINICAL DATA:  NG tube placement. EXAM: ABDOMEN - 1 VIEW COMPARISON:  03/30/2020 FINDINGS: The NG tube tip is in the body region the stomach. Persistent dilated air-filled small bowel.  No free air. IMPRESSION: NG tube tip is in the body region of the stomach. Electronically Signed   By: Rudie Meyer M.D.   On: 04/11/2020 15:38    Assessment/Plan Active Problems:   Acute on chronic respiratory failure with hypoxia (HCC)   Severe sepsis (HCC)   Chronic combined systolic and diastolic heart failure (HCC)   Chronic atrial fibrillation (HCC)   Sleep apnea   COPD, severe (HCC)   1. Acute on chronic respiratory failure with hypoxia  patient has done well weaning from BiPAP after 24 hours.  Currently on 1 L nasal cannula satting well no distress.  Continue supportive measures and pulmonary toilet. 2. Severe sepsis resolved hemodynamics are stable. 3. Chronic combined systolic diastolic heart failure compensated monitor fluid status closely. 4. Chronic atrial fibrillation rate is controlled 5. Sleep apnea no change 6. Severe COPD at baseline   I have personally seen and evaluated the patient, evaluated laboratory and imaging results, formulated  the assessment and plan and placed orders. The Patient requires high complexity decision making with multiple systems involvement.  Rounds were done with the Respiratory Therapy Director and Staff therapists and discussed with nursing staff also.  Allyne Gee, MD [facility] Pulmonary Critical Care Medicine Sleep Medicine

## 2020-04-13 ENCOUNTER — Other Ambulatory Visit (HOSPITAL_COMMUNITY): Payer: Self-pay

## 2020-04-13 DIAGNOSIS — J40 Bronchitis, not specified as acute or chronic: Secondary | ICD-10-CM | POA: Diagnosis not present

## 2020-04-13 DIAGNOSIS — J1282 Pneumonia due to coronavirus disease 2019: Secondary | ICD-10-CM | POA: Diagnosis not present

## 2020-04-13 DIAGNOSIS — J9620 Acute and chronic respiratory failure, unspecified whether with hypoxia or hypercapnia: Secondary | ICD-10-CM | POA: Diagnosis not present

## 2020-04-13 DIAGNOSIS — Z4682 Encounter for fitting and adjustment of non-vascular catheter: Secondary | ICD-10-CM | POA: Diagnosis not present

## 2020-04-13 DIAGNOSIS — U071 COVID-19: Secondary | ICD-10-CM | POA: Diagnosis not present

## 2020-04-13 LAB — MAGNESIUM: Magnesium: 2 mg/dL (ref 1.7–2.4)

## 2020-04-13 LAB — POTASSIUM: Potassium: 4.3 mmol/L (ref 3.5–5.1)

## 2020-04-13 NOTE — Progress Notes (Signed)
PROGRESS NOTE    Cody Hale  BHA:[id number] DOB: [birth year] DOA: 03/30/2020  Brief Narrative:  Cody Hale is an 69 y.o. male with medical history significant of combined systolic/diastolic congestive heart failure, bioprosthetic mitral valve placement of endocarditis in 2014, chronic atrial fibrillation on chronic anticoagulation, COPD, diabetes mellitus, history of T11/L4 fusion of the lumbar fracture 05/25/2019 with wound dehiscence noted 06/14/2019, foot wound, prior history of dysphagia requiring PEG tube placement on 06/04/2019, chronic hypoxemic respiratory failure who was admitted to [facility] on 03/16/2020 from high [facility] assisted living facility with acute on chronic hypoxemic respiratory failure.  Chest x-ray showed findings concerning for pneumonia.  He was started on ceftriaxone, azithromycin for community-acquired pneumonia.  His blood cultures showed group G Streptococcus and ceftriaxone was continued.  Patient underwent transthoracic echocardiogram with no overt findings of endocarditis.  Repeat blood cultures were negative.  Infectious disease recommended transesophageal echocardiogram.  However, patient had worsening respiratory status and required oxygen which precluded him from undergoing a TEE.  His hospital stay was complicated by acute on chronic hypoxemic respiratory failure.  Patient was placed on noninvasive ventilation with BiPAP.  However, he decompensated and had to be intubated on 03/21/2020.  Pulmonary was consulted and he underwent bedside bronchoscopy with suctioning of the mucous plug.  He had difficult time weaning from the ventilator therefore transferred to [facility] in [place].   -He is currently extubated and on oxygen by nasal cannula. -He still having some loose stools but stool for C. difficile negative.  Denies having any abdominal pain.  Assessment & Plan: Acute on chronic respiratory failure with hypoxemia Pneumonia Sepsis/bacteremia  with Streptococcus group G History of bacterial endocarditis Bioprosthetic mitral valve replacement Diabetes mellitus type 2 Sacral pressure ulcer unspecified stage Severe COPD Right foot pressure ulcer Chronic atrial fibrillation Congestive heart failure Diarrhea Dysphagia/protein calorie malnutrition  Acute on chronic hypoxemic respiratory failure: Likely secondary to pneumonia.  At the acute facility he received treatment with IV ceftriaxone, azithromycin. However, he continued to have opacification of left hemithorax likely combination of effusion, atelectasis with mucous plugging. He is extubated at this time on oxygen by nasal cannula. -Continues on treatment with ceftriaxone for Streptococcus group G bacteremia.  Continue current management. -He also has severe COPD.  Continue home medication and management per primary team.  Pneumonia: As mentioned above he was already treated with ceftriaxone, azithromycin.  However, after that unfortunately continues to have mucous plugging, suspected aspiration with worsening respiratory failure. Currently extubated. Remains on treatment with ceftriaxone. Per primary team he failed speech/swallow evaluation therefore high risk for aspiration and worsening respiratory failure.  Sepsis with Streptococcus group G: Patient at the acute facility had leukocytosis, tachycardia, tachycardia and fevers along with confusion.  There was also high concern for endocarditis given his bioprosthetic mitral valve and prior history of bacterial endocarditis.  Transthoracic echo did not show any acute findings.  TEE was recommended but unfortunately due to his respiratory status TEE could not be done.  Remains on IV ceftriaxone.  He had blood cultures from 03/20/2020 which have been negative.  Tentative end date for the ceftriaxone will be 04/30/2020 which would be 6 weeks. -Due to the need for prolonged antibiotics he is at risk for C. difficile.  -Per primary team  and nursing he started having diarrhea therefore started on p.o. vancomycin.  Stool for C. difficile was negative.  He still having some loose stools but denies having any abdominal pain.  Improved from previous. -If he  starts having high fevers or worsening leukocytosis would recommend to send for repeat pancultures.  Continue to monitor closely. -Please monitor BUN/cr closely while on antibiotics. 04/13/2020: Now that his respiratory status has improved ideally would want transesophageal echocardiogram.  However, patient failed swallow evaluation and at risk for aspiration.  If possible would recommend TEE.  However, if for some reason TEE is not considered safe in the patient would at least recommend transthoracic echocardiogram towards the end of antibiotic therapy.  Diabetes mellitus type 2: Continue to monitor Accu-Cheks, management of diabetes per primary team.  Sacral pressure ulcer unspecified stage, right foot pressure ulcer: Continue local wound care.  If the wound is worsening consider imaging and consult surgery.  Chronic atrial fibrillation: Continue medication and management per primary team.  Congestive heart failure: Continue medications and management per the primary team.  Dysphagia: Patient apparently failed a speech/swallow evaluation.  Due to his dysphagia he is at risk for aspiration and aspiration pneumonia.  Further management per the primary team.  Plan of care discussed with the patient, primary team and pharmacy.   Subjective: He is extubated.  He is on oxygen by nasal cannula.  He is awake and oriented x3 and looks improved.  He still having some loose stools but improving from previous.  Denies having any abdominal pain.  Objective: Vitals: Temperature 98.7, pulse 72, respiratory rate 20, blood pressure 129/76, pulse oximetry 97% on oxygen nasal cannula.  Examination: Constitutional:  Extubated, awake and oriented, does not appear to be in any acute distress  at this time.  Head: Atraumatic, normocephalic, pupils equal and reactive Eyes: PERLA ENMT: external ears and nose appear normal, Lips appears normal Neck: supple CVS: S1-S2, murmur Respiratory: few rhonchi, no wheezing Abdomen: obese, positive bowel sounds Musculoskeletal: no edema, feet with dressing in place Neuro:  Awake, oriented x3, upper extremity strength intact, has some debility with lower extremity weakness, no focal neurologic deficits noted at this time. Psych:  Stable Skin: no rashes   Data Reviewed: I have personally reviewed following labs and imaging studies  CBC: Recent Labs  Lab 04/09/20 0348 04/12/20 0954  WBC 3.8* 3.4*  HGB 10.4* 10.9*  HCT 34.5* 36.8*  MCV 91.5 92.5  PLT 161 148*    Basic Metabolic Panel: Recent Labs  Lab 04/09/20 0348 04/12/20 0954 04/13/20 0605  NA 135 136  --   K 4.4 4.2 4.3  CL 99 100  --   CO2 27 27  --   GLUCOSE 129* 110*  --   BUN 14 7*  --   CREATININE 0.60* 0.51*  --   CALCIUM 8.5* 8.5*  --   MG 1.9 1.7 2.0  PHOS 3.1 3.3  --     GFR: CrCl cannot be calculated (Unknown ideal weight.).  Liver Function Tests: Recent Labs  Lab 04/12/20 0954  ALBUMIN 2.5*    CBG: No results for input(s): GLUCAP in the last 168 hours.   Recent Results (from the past 240 hour(s))  C Difficile Quick Screen (NO PCR Reflex)     Status: None   Collection Time: 04/07/20 11:48 AM   Specimen: STOOL  Result Value Ref Range Status   C Diff antigen NEGATIVE NEGATIVE Final   C Diff toxin NEGATIVE NEGATIVE Final   C Diff interpretation No C. difficile detected.  Final    Comment: Performed at [facility], [address] [place], [place] [address]       Radiology Studies: DG Abd 1 View  Result Date: 04/11/2020 CLINICAL DATA:  NG tube placement. EXAM: ABDOMEN - 1 VIEW COMPARISON:  03/30/2020 FINDINGS: The NG tube tip is in the body region the stomach. Persistent dilated air-filled small bowel.  No free air. IMPRESSION: NG  tube tip is in the body region of the stomach. Electronically Signed   By: Rudie Meyer M.D.   On: 04/11/2020 15:38   DG Abd Portable 1V  Result Date: 04/13/2020 CLINICAL DATA:  Ileus EXAM: PORTABLE ABDOMEN - 1 VIEW COMPARISON:  04/11/2020 FINDINGS: 0641 hours. NG tube tip is in the mid to distal stomach. Diffuse gaseous small bowel distention seen previously has decreased in the interval. Extensive thoracolumbar fusion hardware evident. IMPRESSION: NG tube tip is in the mid to distal stomach. Electronically Signed   By: Kennith Center M.D.   On: 04/13/2020 06:50    Scheduled Meds: Please see MAR  Vonzella Nipple, MD  04/13/2020, 2:37 PM

## 2020-04-14 DIAGNOSIS — J40 Bronchitis, not specified as acute or chronic: Secondary | ICD-10-CM | POA: Diagnosis not present

## 2020-04-14 DIAGNOSIS — U071 COVID-19: Secondary | ICD-10-CM | POA: Diagnosis not present

## 2020-04-14 DIAGNOSIS — J1282 Pneumonia due to coronavirus disease 2019: Secondary | ICD-10-CM | POA: Diagnosis not present

## 2020-04-14 DIAGNOSIS — J9620 Acute and chronic respiratory failure, unspecified whether with hypoxia or hypercapnia: Secondary | ICD-10-CM | POA: Diagnosis not present

## 2020-04-15 DIAGNOSIS — J9620 Acute and chronic respiratory failure, unspecified whether with hypoxia or hypercapnia: Secondary | ICD-10-CM | POA: Diagnosis not present

## 2020-04-15 DIAGNOSIS — U071 COVID-19: Secondary | ICD-10-CM | POA: Diagnosis not present

## 2020-04-15 DIAGNOSIS — J1282 Pneumonia due to coronavirus disease 2019: Secondary | ICD-10-CM | POA: Diagnosis not present

## 2020-04-15 DIAGNOSIS — J40 Bronchitis, not specified as acute or chronic: Secondary | ICD-10-CM | POA: Diagnosis not present

## 2020-04-15 LAB — BASIC METABOLIC PANEL
Anion gap: 7 (ref 5–15)
BUN: 8 mg/dL (ref 8–23)
CO2: 32 mmol/L (ref 22–32)
Calcium: 8.5 mg/dL — ABNORMAL LOW (ref 8.9–10.3)
Chloride: 98 mmol/L (ref 98–111)
Creatinine, Ser: 0.55 mg/dL — ABNORMAL LOW (ref 0.61–1.24)
GFR, Estimated: >60 mL/min (ref >60)
Glucose, Bld: 184 mg/dL — ABNORMAL HIGH (ref 70–99)
Potassium: 3.7 mmol/L (ref 3.5–5.1)
Sodium: 137 mmol/L (ref 135–145)

## 2020-04-15 LAB — CBC
HCT: 37.9 % — ABNORMAL LOW (ref 39.0–52.0)
Hemoglobin: 11.2 g/dL — ABNORMAL LOW (ref 13.0–17.0)
MCH: 27.5 pg (ref 26.0–34.0)
MCHC: 29.6 g/dL — ABNORMAL LOW (ref 30.0–36.0)
MCV: 93.1 fL (ref 80.0–100.0)
Platelets: 144 10*3/uL — ABNORMAL LOW (ref 150–400)
RBC: 4.07 MIL/uL — ABNORMAL LOW (ref 4.22–5.81)
RDW: 16.1 % — ABNORMAL HIGH (ref 11.5–15.5)
WBC: 3.4 10*3/uL — ABNORMAL LOW (ref 4.0–10.5)
nRBC: 0 % (ref 0.0–0.2)

## 2020-04-16 DIAGNOSIS — J1282 Pneumonia due to coronavirus disease 2019: Secondary | ICD-10-CM | POA: Diagnosis not present

## 2020-04-16 DIAGNOSIS — J40 Bronchitis, not specified as acute or chronic: Secondary | ICD-10-CM | POA: Diagnosis not present

## 2020-04-16 DIAGNOSIS — U071 COVID-19: Secondary | ICD-10-CM | POA: Diagnosis not present

## 2020-04-16 DIAGNOSIS — J9620 Acute and chronic respiratory failure, unspecified whether with hypoxia or hypercapnia: Secondary | ICD-10-CM | POA: Diagnosis not present

## 2020-04-17 ENCOUNTER — Other Ambulatory Visit (HOSPITAL_COMMUNITY): Payer: Self-pay

## 2020-04-17 DIAGNOSIS — U071 COVID-19: Secondary | ICD-10-CM | POA: Diagnosis not present

## 2020-04-17 DIAGNOSIS — I482 Chronic atrial fibrillation, unspecified: Secondary | ICD-10-CM | POA: Diagnosis not present

## 2020-04-17 DIAGNOSIS — I517 Cardiomegaly: Secondary | ICD-10-CM | POA: Diagnosis not present

## 2020-04-17 DIAGNOSIS — I4821 Permanent atrial fibrillation: Secondary | ICD-10-CM | POA: Diagnosis not present

## 2020-04-17 DIAGNOSIS — I5042 Chronic combined systolic (congestive) and diastolic (congestive) heart failure: Secondary | ICD-10-CM | POA: Diagnosis not present

## 2020-04-17 DIAGNOSIS — Z9911 Dependence on respirator [ventilator] status: Secondary | ICD-10-CM | POA: Diagnosis not present

## 2020-04-17 DIAGNOSIS — I5022 Chronic systolic (congestive) heart failure: Secondary | ICD-10-CM | POA: Diagnosis not present

## 2020-04-17 DIAGNOSIS — I361 Nonrheumatic tricuspid (valve) insufficiency: Secondary | ICD-10-CM | POA: Diagnosis not present

## 2020-04-17 DIAGNOSIS — J449 Chronic obstructive pulmonary disease, unspecified: Secondary | ICD-10-CM | POA: Diagnosis not present

## 2020-04-17 DIAGNOSIS — A419 Sepsis, unspecified organism: Secondary | ICD-10-CM | POA: Diagnosis not present

## 2020-04-17 DIAGNOSIS — J1282 Pneumonia due to coronavirus disease 2019: Secondary | ICD-10-CM | POA: Diagnosis not present

## 2020-04-17 DIAGNOSIS — J9620 Acute and chronic respiratory failure, unspecified whether with hypoxia or hypercapnia: Secondary | ICD-10-CM | POA: Diagnosis not present

## 2020-04-17 DIAGNOSIS — J9621 Acute and chronic respiratory failure with hypoxia: Secondary | ICD-10-CM | POA: Diagnosis not present

## 2020-04-17 DIAGNOSIS — R6520 Severe sepsis without septic shock: Secondary | ICD-10-CM | POA: Diagnosis not present

## 2020-04-17 DIAGNOSIS — J90 Pleural effusion, not elsewhere classified: Secondary | ICD-10-CM | POA: Diagnosis not present

## 2020-04-17 DIAGNOSIS — J40 Bronchitis, not specified as acute or chronic: Secondary | ICD-10-CM | POA: Diagnosis not present

## 2020-04-17 DIAGNOSIS — Z952 Presence of prosthetic heart valve: Secondary | ICD-10-CM | POA: Diagnosis not present

## 2020-04-17 DIAGNOSIS — J811 Chronic pulmonary edema: Secondary | ICD-10-CM | POA: Diagnosis not present

## 2020-04-17 DIAGNOSIS — G4733 Obstructive sleep apnea (adult) (pediatric): Secondary | ICD-10-CM | POA: Diagnosis not present

## 2020-04-17 LAB — BLOOD GAS, ARTERIAL
Acid-Base Excess: 7.9 mmol/L — ABNORMAL HIGH (ref 0.0–2.0)
Acid-Base Excess: 5.7 mmol/L — ABNORMAL HIGH (ref 0.0–2.0)
Acid-Base Excess: 8.2 mmol/L — ABNORMAL HIGH (ref 0.0–2.0)
Bicarbonate: 34.5 mmol/L — ABNORMAL HIGH (ref 20.0–28.0)
Bicarbonate: 33.5 mmol/L — ABNORMAL HIGH (ref 20.0–28.0)
Bicarbonate: 33.3 mmol/L — ABNORMAL HIGH (ref 20.0–28.0)
FIO2: 32
FIO2: 40
FIO2: 35
O2 Saturation: 86.5 %
O2 Saturation: 96.9 %
O2 Saturation: 97.5 %
Patient temperature: 36.1
Patient temperature: 35.8
Patient temperature: 37
pCO2 arterial: 72.5 mmHg (ref 32.0–48.0)
pCO2 arterial: 84.7 mmHg (ref 32.0–48.0)
pCO2 arterial: 56.3 mmHg — ABNORMAL HIGH (ref 32.0–48.0)
pH, Arterial: 7.293 — ABNORMAL LOW (ref 7.350–7.450)
pH, Arterial: 7.212 — ABNORMAL LOW (ref 7.350–7.450)
pH, Arterial: 7.389 (ref 7.350–7.450)
pO2, Arterial: 52.7 mmHg — ABNORMAL LOW (ref 83.0–108.0)
pO2, Arterial: 96.5 mmHg (ref 83.0–108.0)
pO2, Arterial: 86.7 mmHg (ref 83.0–108.0)

## 2020-04-17 NOTE — Progress Notes (Signed)
  Echocardiogram 2D Echocardiogram has been performed.  Cody Hale 04/17/2020, 10:22 AM

## 2020-04-17 NOTE — Progress Notes (Signed)
Pulmonary Critical Care Medicine [facility] [place]   PULMONARY CRITICAL CARE SERVICE  PROGRESS NOTE  Date of Service: 04/17/2020  Cody Hale  LID:[id number]  DOB: 17-Apr-1951   DOA: 03/30/2020  Referring Physician: Carron Curie, MD  HPI: Cody Hale is a 69 y.o. male seen for follow up of Acute on Chronic Respiratory Failure.  Overnight patient was placed on BiPAP apparently had increased episodes of desaturations right now is on BiPAP 35% FiO2.  On further evaluation patient received some sedation yesterday afternoon in the form of Ativan for increased agitation anxiety.  Medications: Reviewed on Rounds  Physical Exam:  Vitals: Temperature is 96.4 pulse 94 respiratory rate 31 blood pressure is 188/94 saturations 94%  Ventilator Settings on BiPAP FiO2 35% pressure 20/5  . General: Comfortable at this time . Eyes: Grossly normal lids, irises & conjunctiva . ENT: grossly tongue is normal . Neck: no obvious mass . Cardiovascular: S1 S2 normal no gallop . Respiratory: Scattered rhonchi expansion is equal . Abdomen: soft . Skin: no rash seen on limited exam . Musculoskeletal: not rigid . Psychiatric:unable to assess . Neurologic: no seizure no involuntary movements         Lab Data:   Basic Metabolic Panel: Recent Labs  Lab 04/12/20 0954 04/13/20 0605 04/15/20 0437  NA 136  --  137  K 4.2 4.3 3.7  CL 100  --  98  CO2 27  --  32  GLUCOSE 110*  --  184*  BUN 7*  --  8  CREATININE 0.51*  --  0.55*  CALCIUM 8.5*  --  8.5*  MG 1.7 2.0  --   PHOS 3.3  --   --     ABG: Recent Labs  Lab 04/17/20 0330 04/17/20 0610  PHART 7.293* 7.212*  PCO2ART 72.5* 84.7*  PO2ART 52.7* 96.5  HCO3 34.5* 33.5*  O2SAT 86.5 96.9    Liver Function Tests: Recent Labs  Lab 04/12/20 0954  ALBUMIN 2.5*   No results for input(s): LIPASE, AMYLASE in the last 168 hours. No results for input(s): AMMONIA in the last 168 hours.  CBC: Recent Labs  Lab 04/12/20 0954  04/15/20 0437  WBC 3.4* 3.4*  HGB 10.9* 11.2*  HCT 36.8* 37.9*  MCV 92.5 93.1  PLT 148* 144*    Cardiac Enzymes: No results for input(s): CKTOTAL, CKMB, CKMBINDEX, TROPONINI in the last 168 hours.  BNP (last 3 results) Recent Labs    05/06/19 1552 06/03/19 2149 04/05/20 0053  BNP 326.0* 368.9* 184.4*    ProBNP (last 3 results) No results for input(s): PROBNP in the last 8760 hours.  Radiological Exams: DG CHEST PORT 1 VIEW  Result Date: 04/17/2020 CLINICAL DATA:  Respiratory distress. EXAM: PORTABLE CHEST 1 VIEW COMPARISON:  04/09/2020 FINDINGS: The endotracheal tube has been removed. An enteric tube courses into the abdomen with tip not imaged. The cardiac silhouette remains enlarged. Aortic atherosclerosis and prior sternotomy are noted. There is pulmonary vascular congestion with interstitial and hazy airspace opacities bilaterally. There is persistent asymmetric retrocardiac opacity in the left lung base, and there are small bilateral pleural effusions. No pneumothorax is identified. IMPRESSION: Interval extubation. Cardiomegaly with edema and small bilateral pleural effusions. Persistent asymmetric left basilar opacity may reflect atelectasis or pneumonia. Electronically Signed   By: Sebastian Ache M.D.   On: 04/17/2020 04:03    Assessment/Plan Active Problems:   Acute on chronic respiratory failure with hypoxia (HCC)   Severe sepsis (HCC)   Chronic combined systolic and  diastolic heart failure (HCC)   Chronic atrial fibrillation (HCC)   Sleep apnea   COPD, severe (HCC)   1. Acute on chronic respiratory failure hypoxia we will continue with BiPAP FiO2 35% for now.  Would recommend doing a repeat ABG.  Also back off on sedation right now hopefully we can avoid weighted reintubated 2. Severe sepsis resolved hemodynamics are stable. 3. Chronic combined systolic and diastolic heart failure compensated 4. Chronic atrial fibrillation rate is controlled 5. Sleep apnea patient  does have a history of sleep apnea will benefit from ongoing BiPAP use. 6. Severe COPD medical management   I have personally seen and evaluated the patient, evaluated laboratory and imaging results, formulated the assessment and plan and placed orders. The Patient requires high complexity decision making with multiple systems involvement.  Rounds were done with the Respiratory Therapy Director and Staff therapists and discussed with nursing staff also.  Yevonne Pax, MD [facility] Pulmonary Critical Care Medicine Sleep Medicine

## 2020-04-18 ENCOUNTER — Other Ambulatory Visit (HOSPITAL_COMMUNITY): Payer: Self-pay

## 2020-04-18 DIAGNOSIS — U071 COVID-19: Secondary | ICD-10-CM | POA: Diagnosis not present

## 2020-04-18 DIAGNOSIS — I482 Chronic atrial fibrillation, unspecified: Secondary | ICD-10-CM | POA: Diagnosis not present

## 2020-04-18 DIAGNOSIS — A419 Sepsis, unspecified organism: Secondary | ICD-10-CM | POA: Diagnosis not present

## 2020-04-18 DIAGNOSIS — J449 Chronic obstructive pulmonary disease, unspecified: Secondary | ICD-10-CM | POA: Diagnosis not present

## 2020-04-18 DIAGNOSIS — I517 Cardiomegaly: Secondary | ICD-10-CM | POA: Diagnosis not present

## 2020-04-18 DIAGNOSIS — J1282 Pneumonia due to coronavirus disease 2019: Secondary | ICD-10-CM | POA: Diagnosis not present

## 2020-04-18 DIAGNOSIS — J9621 Acute and chronic respiratory failure with hypoxia: Secondary | ICD-10-CM | POA: Diagnosis not present

## 2020-04-18 DIAGNOSIS — I5042 Chronic combined systolic (congestive) and diastolic (congestive) heart failure: Secondary | ICD-10-CM | POA: Diagnosis not present

## 2020-04-18 DIAGNOSIS — J90 Pleural effusion, not elsewhere classified: Secondary | ICD-10-CM | POA: Diagnosis not present

## 2020-04-18 DIAGNOSIS — J9690 Respiratory failure, unspecified, unspecified whether with hypoxia or hypercapnia: Secondary | ICD-10-CM | POA: Diagnosis not present

## 2020-04-18 DIAGNOSIS — J40 Bronchitis, not specified as acute or chronic: Secondary | ICD-10-CM | POA: Diagnosis not present

## 2020-04-18 DIAGNOSIS — R6520 Severe sepsis without septic shock: Secondary | ICD-10-CM | POA: Diagnosis not present

## 2020-04-18 DIAGNOSIS — G4733 Obstructive sleep apnea (adult) (pediatric): Secondary | ICD-10-CM | POA: Diagnosis not present

## 2020-04-18 DIAGNOSIS — Z9911 Dependence on respirator [ventilator] status: Secondary | ICD-10-CM | POA: Diagnosis not present

## 2020-04-18 DIAGNOSIS — J9620 Acute and chronic respiratory failure, unspecified whether with hypoxia or hypercapnia: Secondary | ICD-10-CM | POA: Diagnosis not present

## 2020-04-18 LAB — CBC
HCT: 38.9 % — ABNORMAL LOW (ref 39.0–52.0)
Hemoglobin: 12.2 g/dL — ABNORMAL LOW (ref 13.0–17.0)
MCH: 29.2 pg (ref 26.0–34.0)
MCHC: 31.4 g/dL (ref 30.0–36.0)
MCV: 93.1 fL (ref 80.0–100.0)
Platelets: 150 10*3/uL (ref 150–400)
RBC: 4.18 MIL/uL — ABNORMAL LOW (ref 4.22–5.81)
RDW: 16.2 % — ABNORMAL HIGH (ref 11.5–15.5)
WBC: 4.7 10*3/uL (ref 4.0–10.5)
nRBC: 0 % (ref 0.0–0.2)

## 2020-04-18 LAB — ECHOCARDIOGRAM COMPLETE
Area-P 1/2: 2.55 cm2
Height: 69 in
S' Lateral: 4.8 cm
Weight: 3559.11 oz

## 2020-04-18 LAB — RENAL FUNCTION PANEL
Albumin: 2.6 g/dL — ABNORMAL LOW (ref 3.5–5.0)
Anion gap: 10 (ref 5–15)
BUN: 18 mg/dL (ref 8–23)
CO2: 34 mmol/L — ABNORMAL HIGH (ref 22–32)
Calcium: 9.2 mg/dL (ref 8.9–10.3)
Chloride: 97 mmol/L — ABNORMAL LOW (ref 98–111)
Creatinine, Ser: 0.57 mg/dL — ABNORMAL LOW (ref 0.61–1.24)
GFR, Estimated: >60 mL/min (ref >60)
Glucose, Bld: 156 mg/dL — ABNORMAL HIGH (ref 70–99)
Phosphorus: 2.3 mg/dL — ABNORMAL LOW (ref 2.5–4.6)
Potassium: 4.1 mmol/L (ref 3.5–5.1)
Sodium: 141 mmol/L (ref 135–145)

## 2020-04-18 LAB — MAGNESIUM: Magnesium: 1.8 mg/dL (ref 1.7–2.4)

## 2020-04-18 MED ORDER — LIDOCAINE HCL 1 % IJ SOLN
INTRAMUSCULAR | Status: AC
  Filled 2020-04-18: qty 20

## 2020-04-18 NOTE — Progress Notes (Addendum)
Pulmonary Critical Care Medicine [facility] [place]   PULMONARY CRITICAL CARE SERVICE  PROGRESS NOTE  Date of Service: 04/18/2020  Korbyn Chopin  ZOX:[id number]  DOB: [birth year]   DOA: 03/30/2020  Referring Physician: Carron Curie, MD  HPI: Laron Boorman is a 69 y.o. male seen for follow up of Acute on Chronic Respiratory Failure.  Patient remains on 2 L nasal cannula at this time satting well no fever distress.  Medications: Reviewed on Rounds  Physical Exam:  Vitals: Pulse 80 respirations 20 BP 149/70 O2 sat 95% temp 96.2  Ventilator Settings not currently on ventilator  . General: Comfortable at this time . Eyes: Grossly normal lids, irises & conjunctiva . ENT: grossly tongue is normal . Neck: no obvious mass . Cardiovascular: S1 S2 normal no gallop . Respiratory: No rales rhonchi noted . Abdomen: soft . Skin: no rash seen on limited exam . Musculoskeletal: not rigid . Psychiatric:unable to assess . Neurologic: no seizure no involuntary movements         Lab Data:   Basic Metabolic Panel: Recent Labs  Lab 04/12/20 0954 04/13/20 0605 04/15/20 0437 04/18/20 0535  NA 136  --  137 141  K 4.2 4.3 3.7 4.1  CL 100  --  98 97*  CO2 27  --  32 34*  GLUCOSE 110*  --  184* 156*  BUN 7*  --  8 18  CREATININE 0.51*  --  0.55* 0.57*  CALCIUM 8.5*  --  8.5* 9.2  MG 1.7 2.0  --  1.8  PHOS 3.3  --   --  2.3*    ABG: Recent Labs  Lab 04/17/20 0330 04/17/20 0610 04/17/20 1022  PHART 7.293* 7.212* 7.389  PCO2ART 72.5* 84.7* 56.3*  PO2ART 52.7* 96.5 86.7  HCO3 34.5* 33.5* 33.3*  O2SAT 86.5 96.9 97.5    Liver Function Tests: Recent Labs  Lab 04/12/20 0954 04/18/20 0535  ALBUMIN 2.5* 2.6*   No results for input(s): LIPASE, AMYLASE in the last 168 hours. No results for input(s): AMMONIA in the last 168 hours.  CBC: Recent Labs  Lab 04/12/20 0954 04/15/20 0437 04/18/20 0535  WBC 3.4* 3.4* 4.7  HGB 10.9* 11.2* 12.2*  HCT 36.8* 37.9* 38.9*   MCV 92.5 93.1 93.1  PLT 148* 144* 150    Cardiac Enzymes: No results for input(s): CKTOTAL, CKMB, CKMBINDEX, TROPONINI in the last 168 hours.  BNP (last 3 results) Recent Labs    05/06/19 1552 06/03/19 2149 04/05/20 0053  BNP 326.0* 368.9* 184.4*    ProBNP (last 3 results) No results for input(s): PROBNP in the last 8760 hours.  Radiological Exams: DG CHEST PORT 1 VIEW  Result Date: 04/18/2020 CLINICAL DATA:  Respiratory failure.  History of COPD. EXAM: PORTABLE CHEST 1 VIEW COMPARISON:  Single-view of the chest 04/17/2020 and 04/09/2020 FINDINGS: NG tube remains in place. Bilateral pleural effusions and diffuse airspace disease appear unchanged since the most recent exam. Cardiomegaly. No pneumothorax. Remote bilateral rib fractures noted. IMPRESSION: No change in diffuse bilateral airspace disease and pleural effusions. Electronically Signed   By: Drusilla Kanner M.D.   On: 04/18/2020 07:47   DG CHEST PORT 1 VIEW  Result Date: 04/17/2020 CLINICAL DATA:  Respiratory distress. EXAM: PORTABLE CHEST 1 VIEW COMPARISON:  04/09/2020 FINDINGS: The endotracheal tube has been removed. An enteric tube courses into the abdomen with tip not imaged. The cardiac silhouette remains enlarged. Aortic atherosclerosis and prior sternotomy are noted. There is pulmonary vascular congestion with interstitial and hazy  airspace opacities bilaterally. There is persistent asymmetric retrocardiac opacity in the left lung base, and there are small bilateral pleural effusions. No pneumothorax is identified. IMPRESSION: Interval extubation. Cardiomegaly with edema and small bilateral pleural effusions. Persistent asymmetric left basilar opacity may reflect atelectasis or pneumonia. Electronically Signed   By: Sebastian Ache M.D.   On: 04/17/2020 04:03   ECHOCARDIOGRAM COMPLETE  Result Date: 04/18/2020    ECHOCARDIOGRAM REPORT   Patient Name:   BRAE GARTMAN Date of Exam: 04/17/2020 Medical Rec #:  [id number]     Height:       69.0 in Accession #:    [id number]   Weight:       222.4 lb Date of Birth:  [birth year]    BSA:          2.162 m Patient Age:    69 years     BP:           133/64 mmHg Patient Gender: M            HR:           64 bpm. Exam Location:  Inpatient Procedure: 2D Echo, Cardiac Doppler and Color Doppler Indications:     CHF  History:         Patient has prior history of Echocardiogram examinations, most                  recent 03/18/2020. CHF, COPD, Mitral Valve Disease,                  Arrythmias:Atrial Fibrillation; Risk Factors:Diabetes.                  Respiratory failure, sepsis.                   Mitral Valve: 29 mm St. Jude valve is present in the mitral                  position.  Sonographer:     Lavenia Atlas RDCS Referring Phys:  [id number] Tyresse Jayson A Lyncoln Ledgerwood Diagnosing Phys: Orpah Cobb MD  Sonographer Comments: Image acquisition challenging due to uncooperative patient. IMPRESSIONS  1. Left ventricular ejection fraction, by estimation, is 25 to 30%. The left ventricle has severely decreased function. The left ventricle demonstrates regional wall motion abnormalities (see scoring diagram/findings for description). The left ventricular internal cavity size was mildly dilated. Left ventricular diastolic parameters are indeterminate.  2. Right ventricular systolic function is moderately reduced. The right ventricular size is mildly enlarged. There is moderately elevated pulmonary artery systolic pressure.  3. Left atrial size was severely dilated.  4. Right atrial size was severely dilated.  5. Can not exclude vegetation. The mitral valve has been repaired/replaced. Trivial mitral valve regurgitation. Moderate mitral stenosis. There is a 29 mm St. Jude present in the mitral position.  6. Large pleural effusion in the left lateral region.  7. Tricuspid valve regurgitation is severe.  8. Cannot exclude vegetation. The aortic valve is tricuspid. There is moderate calcification of the aortic valve. There is  mild thickening of the aortic valve. Aortic valve regurgitation is trivial. Mild aortic valve sclerosis is present, with no evidence of aortic valve stenosis.  9. There is mild (Grade II) atheroma plaque involving the aortic root and ascending aorta. FINDINGS  Left Ventricle: Left ventricular ejection fraction, by estimation, is 25 to 30%. The left ventricle has severely decreased function. The left ventricle demonstrates regional wall motion abnormalities. Severe akinesis of  the left ventricular, entire anterior wall. Moderate dyskinesis of the left ventricular, entire septal wall. Mild hypokinesis of the left ventricular, entire anterolateral wall. The left ventricular internal cavity size was mildly dilated. There is borderline left ventricular hypertrophy. Left ventricular diastolic parameters are indeterminate.  LV Wall Scoring: The entire anterior wall and entire septum are dyskinetic. The entire lateral wall, entire inferior wall, and apex are hypokinetic. Right Ventricle: The right ventricular size is mildly enlarged. No increase in right ventricular wall thickness. Right ventricular systolic function is moderately reduced. There is moderately elevated pulmonary artery systolic pressure. The tricuspid regurgitant velocity is 3.39 m/s, and with an assumed right atrial pressure of 8 mmHg, the estimated right ventricular systolic pressure is 54.0 mmHg. Left Atrium: Left atrial size was severely dilated. Right Atrium: Right atrial size was severely dilated. Pericardium: There is no evidence of pericardial effusion. Mitral Valve: Can not exclude vegetation. The mitral valve has been repaired/replaced. Trivial mitral valve regurgitation. There is a 29 mm St. Jude present in the mitral position. Moderate mitral valve stenosis. MV peak gradient, 20.6 mmHg. The mean mitral valve gradient is 7.0 mmHg. Tricuspid Valve: The tricuspid valve is normal in structure. Tricuspid valve regurgitation is severe. Aortic Valve:  Cannot exclude vegetation. The aortic valve is tricuspid. There is moderate calcification of the aortic valve. There is mild thickening of the aortic valve. There is mild aortic valve annular calcification. Aortic valve regurgitation is trivial. Mild aortic valve sclerosis is present, with no evidence of aortic valve stenosis. Pulmonic Valve: The pulmonic valve was normal in structure. Pulmonic valve regurgitation is not visualized. Aorta: The aortic root is normal in size and structure. There is mild (Grade II) atheroma plaque involving the aortic root and ascending aorta. Venous: The inferior vena cava was not well visualized. IAS/Shunts: The interatrial septum was not assessed. Additional Comments: There is a large pleural effusion in the left lateral region.  LEFT VENTRICLE PLAX 2D LVIDd:         5.40 cm  Diastology LVIDs:         4.80 cm  LV e' medial:    3.81 cm/s LV PW:         1.10 cm  LV E/e' medial:  47.8 LV IVS:        1.10 cm  LV e' lateral:   9.03 cm/s LVOT diam:     2.40 cm  LV E/e' lateral: 20.2 LV SV:         75 LV SV Index:   35 LVOT Area:     4.52 cm  RIGHT VENTRICLE RV Basal diam:  4.70 cm RV S prime:     8.70 cm/s TAPSE (M-mode): 2.1 cm LEFT ATRIUM              Index       RIGHT ATRIUM           Index LA diam:        4.80 cm  2.22 cm/m  RA Area:     23.10 cm LA Vol (A2C):   97.8 ml  45.25 ml/m RA Volume:   73.20 ml  33.86 ml/m LA Vol (A4C):   111.0 ml 51.35 ml/m LA Biplane Vol: 115.0 ml 53.20 ml/m  AORTIC VALVE LVOT Vmax:   92.40 cm/s LVOT Vmean:  57.700 cm/s LVOT VTI:    0.166 m  AORTA Ao Root diam: 3.10 cm MITRAL VALVE  TRICUSPID VALVE MV Area (PHT): 2.55 cm     TR Peak grad:   46.0 mmHg MV Peak grad:  20.6 mmHg    TR Vmax:        339.00 cm/s MV Mean grad:  7.0 mmHg MV Vmax:       2.27 m/s     SHUNTS MV Vmean:      111.0 cm/s   Systemic VTI:  0.17 m MV Decel Time: 298 msec     Systemic Diam: 2.40 cm MV E velocity: 182.00 cm/s Orpah Cobb MD Electronically signed by Orpah Cobb MD Signature Date/Time: 04/18/2020/8:57:01 AM    Final     Assessment/Plan Active Problems:   Acute on chronic respiratory failure with hypoxia (HCC)   Severe sepsis (HCC)   Chronic combined systolic and diastolic heart failure (HCC)   Chronic atrial fibrillation (HCC)   Sleep apnea   COPD, severe (HCC)   1. Acute on chronic respiratory failure hypoxia patient continue on nasal cannula at this time currently on 2 L.  We will continue supportive measures and pulmonary toilet. 2. Severe sepsis resolved hemodynamics are stable. 3. Chronic combined systolic and diastolic heart failure compensated 4. Chronic atrial fibrillation rate is controlled 5. Sleep apnea patient does have a history of sleep apnea will benefit from ongoing BiPAP use. 6. Severe COPD medical management   I have personally seen and evaluated the patient, evaluated laboratory and imaging results, formulated the assessment and plan and placed orders. The Patient requires high complexity decision making with multiple systems involvement.  Rounds were done with the Respiratory Therapy Director and Staff therapists and discussed with nursing staff also.  Yevonne Pax, MD [facility] Pulmonary Critical Care Medicine Sleep Medicine

## 2020-04-19 ENCOUNTER — Other Ambulatory Visit (HOSPITAL_COMMUNITY): Payer: Self-pay

## 2020-04-19 DIAGNOSIS — I482 Chronic atrial fibrillation, unspecified: Secondary | ICD-10-CM | POA: Diagnosis not present

## 2020-04-19 DIAGNOSIS — J9621 Acute and chronic respiratory failure with hypoxia: Secondary | ICD-10-CM | POA: Diagnosis not present

## 2020-04-19 DIAGNOSIS — G4733 Obstructive sleep apnea (adult) (pediatric): Secondary | ICD-10-CM | POA: Diagnosis not present

## 2020-04-19 DIAGNOSIS — J449 Chronic obstructive pulmonary disease, unspecified: Secondary | ICD-10-CM | POA: Diagnosis not present

## 2020-04-19 DIAGNOSIS — I5042 Chronic combined systolic (congestive) and diastolic (congestive) heart failure: Secondary | ICD-10-CM | POA: Diagnosis not present

## 2020-04-19 DIAGNOSIS — Z9911 Dependence on respirator [ventilator] status: Secondary | ICD-10-CM | POA: Diagnosis not present

## 2020-04-19 DIAGNOSIS — R6520 Severe sepsis without septic shock: Secondary | ICD-10-CM | POA: Diagnosis not present

## 2020-04-19 DIAGNOSIS — J90 Pleural effusion, not elsewhere classified: Secondary | ICD-10-CM | POA: Diagnosis not present

## 2020-04-19 DIAGNOSIS — A419 Sepsis, unspecified organism: Secondary | ICD-10-CM | POA: Diagnosis not present

## 2020-04-19 HISTORY — PX: IR THORACENTESIS ASP PLEURAL SPACE W/IMG GUIDE: IMG5380

## 2020-04-19 MED ORDER — LIDOCAINE HCL 1 % IJ SOLN
INTRAMUSCULAR | Status: AC
  Filled 2020-04-19: qty 20

## 2020-04-19 MED ORDER — LIDOCAINE HCL (PF) 1 % IJ SOLN
INTRAMUSCULAR | Status: DC | PRN
  Administered 2020-04-19: 10 mL

## 2020-04-19 NOTE — Procedures (Signed)
PROCEDURE SUMMARY:  Successful US guided therapeutic left thoracentesis. Yielded 1.5 liters of amber fluid. Pt tolerated procedure well. No immediate complications.  Specimen was not sent for labs. CXR ordered.  EBL < 5 mL  Hoyt Koch PA-C 04/19/2020 11:23 AM

## 2020-04-19 NOTE — Progress Notes (Addendum)
Pulmonary Critical Care Medicine [facility] [place]   PULMONARY CRITICAL CARE SERVICE  PROGRESS NOTE  Date of Service: 04/19/2020  Cody Hale  ERX:[id number]  DOB: Mar 21, 1951   DOA: 03/30/2020  Referring Physician: Carron Curie, MD  HPI: Cody Hale is a 69 y.o. male seen for follow up of Acute on Chronic Respiratory Failure.  Patient remains on nasal cannula during the day using BiPAP at night satting well currently no distress.  Medications: Reviewed on Rounds  Physical Exam:  Vitals: Pulse 81 respirations 30 BP 135/67 O2 sat 96% temp 97.2  Ventilator Settings not currently on ventilator  . General: Comfortable at this time . Eyes: Grossly normal lids, irises & conjunctiva . ENT: grossly tongue is normal . Neck: no obvious mass . Cardiovascular: S1 S2 normal no gallop . Respiratory: No rales or rhonchi noted . Abdomen: soft . Skin: no rash seen on limited exam . Musculoskeletal: not rigid . Psychiatric:unable to assess . Neurologic: no seizure no involuntary movements         Lab Data:   Basic Metabolic Panel: Recent Labs  Lab 04/13/20 0605 04/15/20 0437 04/18/20 0535  NA  --  137 141  K 4.3 3.7 4.1  CL  --  98 97*  CO2  --  32 34*  GLUCOSE  --  184* 156*  BUN  --  8 18  CREATININE  --  0.55* 0.57*  CALCIUM  --  8.5* 9.2  MG 2.0  --  1.8  PHOS  --   --  2.3*    ABG: Recent Labs  Lab 04/17/20 0330 04/17/20 0610 04/17/20 1022  PHART 7.293* 7.212* 7.389  PCO2ART 72.5* 84.7* 56.3*  PO2ART 52.7* 96.5 86.7  HCO3 34.5* 33.5* 33.3*  O2SAT 86.5 96.9 97.5    Liver Function Tests: Recent Labs  Lab 04/18/20 0535  ALBUMIN 2.6*   No results for input(s): LIPASE, AMYLASE in the last 168 hours. No results for input(s): AMMONIA in the last 168 hours.  CBC: Recent Labs  Lab 04/15/20 0437 04/18/20 0535  WBC 3.4* 4.7  HGB 11.2* 12.2*  HCT 37.9* 38.9*  MCV 93.1 93.1  PLT 144* 150    Cardiac Enzymes: No results for input(s):  CKTOTAL, CKMB, CKMBINDEX, TROPONINI in the last 168 hours.  BNP (last 3 results) Recent Labs    05/06/19 1552 06/03/19 2149 04/05/20 0053  BNP 326.0* 368.9* 184.4*    ProBNP (last 3 results) No results for input(s): PROBNP in the last 8760 hours.  Radiological Exams: DG CHEST PORT 1 VIEW  Result Date: 04/19/2020 CLINICAL DATA:  Pleural effusion.  Pulmonary edema. EXAM: PORTABLE CHEST 1 VIEW COMPARISON:  04/18/2020 FINDINGS: 0522 hours. The cardio pericardial silhouette is enlarged. Diffuse bilateral airspace disease is similar. Retrocardiac collapse/consolidation again noted with bilateral pleural effusions similar to prior. The NG tube passes into the stomach although the distal tip position is not included on the film. IMPRESSION: No substantial interval change. Diffuse bilateral airspace disease with retrocardiac collapse/consolidation and bilateral pleural effusions. Electronically Signed   By: Kennith Center M.D.   On: 04/19/2020 05:59   DG CHEST PORT 1 VIEW  Result Date: 04/18/2020 CLINICAL DATA:  Respiratory failure.  History of COPD. EXAM: PORTABLE CHEST 1 VIEW COMPARISON:  Single-view of the chest 04/17/2020 and 04/09/2020 FINDINGS: NG tube remains in place. Bilateral pleural effusions and diffuse airspace disease appear unchanged since the most recent exam. Cardiomegaly. No pneumothorax. Remote bilateral rib fractures noted. IMPRESSION: No change in diffuse bilateral  airspace disease and pleural effusions. Electronically Signed   By: Drusilla Kanner M.D.   On: 04/18/2020 07:47    Assessment/Plan Active Problems:   Acute on chronic respiratory failure with hypoxia (HCC)   Severe sepsis (HCC)   Chronic combined systolic and diastolic heart failure (HCC)   Chronic atrial fibrillation (HCC)   Sleep apnea   COPD, severe (HCC)   1. Acute on chronic respiratory failure hypoxia  patient continue on nasal cannula during the day resting on BiPAP at night we will continue  supportive measures and pulmonary toilet 2. Severe sepsis resolved hemodynamics are stable. 3. Chronic combined systolic and diastolic heart failure compensated 4. Chronic atrial fibrillation rate is controlled 5. Sleep apnea patient does have a history of sleep apnea will benefit from ongoing BiPAP use. 6. Severe COPD medical management   I have personally seen and evaluated the patient, evaluated laboratory and imaging results, formulated the assessment and plan and placed orders. The Patient requires high complexity decision making with multiple systems involvement.  Rounds were done with the Respiratory Therapy Director and Staff therapists and discussed with nursing staff also.  Yevonne Pax, MD [facility] Pulmonary Critical Care Medicine Sleep Medicine

## 2020-04-20 DIAGNOSIS — A419 Sepsis, unspecified organism: Secondary | ICD-10-CM | POA: Diagnosis not present

## 2020-04-20 DIAGNOSIS — J9621 Acute and chronic respiratory failure with hypoxia: Secondary | ICD-10-CM | POA: Diagnosis not present

## 2020-04-20 DIAGNOSIS — G4733 Obstructive sleep apnea (adult) (pediatric): Secondary | ICD-10-CM | POA: Diagnosis not present

## 2020-04-20 DIAGNOSIS — J449 Chronic obstructive pulmonary disease, unspecified: Secondary | ICD-10-CM | POA: Diagnosis not present

## 2020-04-20 DIAGNOSIS — Z9911 Dependence on respirator [ventilator] status: Secondary | ICD-10-CM | POA: Diagnosis not present

## 2020-04-20 DIAGNOSIS — I482 Chronic atrial fibrillation, unspecified: Secondary | ICD-10-CM | POA: Diagnosis not present

## 2020-04-20 DIAGNOSIS — R6520 Severe sepsis without septic shock: Secondary | ICD-10-CM | POA: Diagnosis not present

## 2020-04-20 DIAGNOSIS — I5042 Chronic combined systolic (congestive) and diastolic (congestive) heart failure: Secondary | ICD-10-CM | POA: Diagnosis not present

## 2020-04-20 LAB — RENAL FUNCTION PANEL
Albumin: 2.5 g/dL — ABNORMAL LOW (ref 3.5–5.0)
Anion gap: 8 (ref 5–15)
BUN: 17 mg/dL (ref 8–23)
CO2: 35 mmol/L — ABNORMAL HIGH (ref 22–32)
Calcium: 9.1 mg/dL (ref 8.9–10.3)
Chloride: 96 mmol/L — ABNORMAL LOW (ref 98–111)
Creatinine, Ser: 0.47 mg/dL — ABNORMAL LOW (ref 0.61–1.24)
GFR, Estimated: >60 mL/min (ref >60)
Glucose, Bld: 166 mg/dL — ABNORMAL HIGH (ref 70–99)
Phosphorus: 3.7 mg/dL (ref 2.5–4.6)
Potassium: 4.6 mmol/L (ref 3.5–5.1)
Sodium: 139 mmol/L (ref 135–145)

## 2020-04-20 LAB — CBC
HCT: 36.6 % — ABNORMAL LOW (ref 39.0–52.0)
Hemoglobin: 10.7 g/dL — ABNORMAL LOW (ref 13.0–17.0)
MCH: 27.5 pg (ref 26.0–34.0)
MCHC: 29.2 g/dL — ABNORMAL LOW (ref 30.0–36.0)
MCV: 94.1 fL (ref 80.0–100.0)
Platelets: 134 10*3/uL — ABNORMAL LOW (ref 150–400)
RBC: 3.89 MIL/uL — ABNORMAL LOW (ref 4.22–5.81)
RDW: 16.1 % — ABNORMAL HIGH (ref 11.5–15.5)
WBC: 3.9 10*3/uL — ABNORMAL LOW (ref 4.0–10.5)
nRBC: 0 % (ref 0.0–0.2)

## 2020-04-20 LAB — MAGNESIUM: Magnesium: 1.9 mg/dL (ref 1.7–2.4)

## 2020-04-20 NOTE — Progress Notes (Addendum)
Pulmonary Critical Care Medicine [facility] [place]   PULMONARY CRITICAL CARE SERVICE  PROGRESS NOTE  Date of Service: 04/20/2020  Cody Hale  RCB:[id number]  DOB: 1950/09/05   DOA: 03/30/2020  Referring Physician: Carron Curie, MD  HPI: Cody Hale is a 69 y.o. male seen for follow up of Acute on Chronic Respiratory Failure.  Patient continues on 2 L nasal cannula using BiPAP at night.  Satting well no distress.  Medications: Reviewed on Rounds  Physical Exam:  Vitals: Pulse 85 respirations 15 BP 142/74 O2 sat 98% temp 97.7  Ventilator Settings not currently on ventilator  . General: Comfortable at this time . Eyes: Grossly normal lids, irises & conjunctiva . ENT: grossly tongue is normal . Neck: no obvious mass . Cardiovascular: S1 S2 normal no gallop . Respiratory: No rales or rhonchi noted . Abdomen: soft . Skin: no rash seen on limited exam . Musculoskeletal: not rigid . Psychiatric:unable to assess . Neurologic: no seizure no involuntary movements         Lab Data:   Basic Metabolic Panel: Recent Labs  Lab 04/15/20 0437 04/18/20 0535 04/20/20 0427  NA 137 141 139  K 3.7 4.1 4.6  CL 98 97* 96*  CO2 32 34* 35*  GLUCOSE 184* 156* 166*  BUN 8 18 17   CREATININE 0.55* 0.57* 0.47*  CALCIUM 8.5* 9.2 9.1  MG  --  1.8 1.9  PHOS  --  2.3* 3.7    ABG: Recent Labs  Lab 04/17/20 0330 04/17/20 0610 04/17/20 1022  PHART 7.293* 7.212* 7.389  PCO2ART 72.5* 84.7* 56.3*  PO2ART 52.7* 96.5 86.7  HCO3 34.5* 33.5* 33.3*  O2SAT 86.5 96.9 97.5    Liver Function Tests: Recent Labs  Lab 04/18/20 0535 04/20/20 0427  ALBUMIN 2.6* 2.5*   No results for input(s): LIPASE, AMYLASE in the last 168 hours. No results for input(s): AMMONIA in the last 168 hours.  CBC: Recent Labs  Lab 04/15/20 0437 04/18/20 0535 04/20/20 0427  WBC 3.4* 4.7 3.9*  HGB 11.2* 12.2* 10.7*  HCT 37.9* 38.9* 36.6*  MCV 93.1 93.1 94.1  PLT 144* 150 134*    Cardiac  Enzymes: No results for input(s): CKTOTAL, CKMB, CKMBINDEX, TROPONINI in the last 168 hours.  BNP (last 3 results) Recent Labs    05/06/19 1552 06/03/19 2149 04/05/20 0053  BNP 326.0* 368.9* 184.4*    ProBNP (last 3 results) No results for input(s): PROBNP in the last 8760 hours.  Radiological Exams: DG Chest 1 View  Result Date: 04/19/2020 CLINICAL DATA:  Post left thoracentesis EXAM: CHEST  1 VIEW COMPARISON:  Earlier same day. FINDINGS: Decreased left pleural effusion and improved left basilar lung aeration post thoracentesis. No pneumothorax. Similar right pleural effusion. Similar patchy bilateral opacities. Stable heart size. IMPRESSION: Decreased left pleural effusion and improved aeration at the left lung base post thoracentesis. No pneumothorax. Otherwise unchanged appearance. Electronically Signed   By: 04/21/2020 M.D.   On: 04/19/2020 11:53   DG CHEST PORT 1 VIEW  Result Date: 04/19/2020 CLINICAL DATA:  Pleural effusion.  Pulmonary edema. EXAM: PORTABLE CHEST 1 VIEW COMPARISON:  04/18/2020 FINDINGS: 0522 hours. The cardio pericardial silhouette is enlarged. Diffuse bilateral airspace disease is similar. Retrocardiac collapse/consolidation again noted with bilateral pleural effusions similar to prior. The NG tube passes into the stomach although the distal tip position is not included on the film. IMPRESSION: No substantial interval change. Diffuse bilateral airspace disease with retrocardiac collapse/consolidation and bilateral pleural effusions. Electronically Signed  By: Kennith Center M.D.   On: 04/19/2020 05:59   IR THORACENTESIS ASP PLEURAL SPACE W/IMG GUIDE  Result Date: 04/19/2020 INDICATION: Patient with bilateral pleural effusions. Request made for left therapeutic thoracentesis. EXAM: ULTRASOUND GUIDED LEFT THORACENTESIS MEDICATIONS: 10 ML 1% LIDOCAINE COMPLICATIONS: None immediate. PROCEDURE: An ultrasound guided thoracentesis was thoroughly discussed with the  patient and questions answered. The benefits, risks, alternatives and complications were also discussed. The patient understands and wishes to proceed with the procedure. Written consent was obtained. Ultrasound was performed to localize and mark an adequate pocket of fluid in the left chest. The area was then prepped and draped in the normal sterile fashion. 1% Lidocaine was used for local anesthesia. A 22g spinal needle was used for administration of lidocaine. Under ultrasound guidance a 6 Fr Safe-T-Centesis catheter was introduced. Thoracentesis was performed. The catheter was removed and a dressing applied. FINDINGS: A total of approximately 1.5 liters of amber fluid was removed. Samples were sent to the laboratory as requested by the clinical team. IMPRESSION: Successful ultrasound guided therapeutic left thoracentesis yielding 1.5 liters of pleural fluid. Read by: Loyce Dys PA-C Electronically Signed   By: Irish Lack M.D.   On: 04/19/2020 11:29    Assessment/Plan Active Problems:   Acute on chronic respiratory failure with hypoxia (HCC)   Severe sepsis (HCC)   Chronic combined systolic and diastolic heart failure (HCC)   Chronic atrial fibrillation (HCC)   Sleep apnea   COPD, severe (HCC)   1. Acute on chronic respiratory failure hypoxia patient continue on nasal cannula during the day resting on BiPAP at night we will continue supportive measures and pulmonary toilet 2. Severe sepsis resolved hemodynamics are stable. 3. Chronic combined systolic and diastolic heart failure compensated 4. Chronic atrial fibrillation rate is controlled 5. Sleep apnea patient does have a history of sleep apnea will benefit from ongoing BiPAP use. 6. Severe COPD medical management   I have personally seen and evaluated the patient, evaluated laboratory and imaging results, formulated the assessment and plan and placed orders. The Patient requires high complexity decision making with multiple  systems involvement.  Rounds were done with the Respiratory Therapy Director and Staff therapists and discussed with nursing staff also.  Yevonne Pax, MD [facility] Pulmonary Critical Care Medicine Sleep Medicine

## 2020-04-21 ENCOUNTER — Other Ambulatory Visit (HOSPITAL_COMMUNITY): Payer: Self-pay

## 2020-04-21 NOTE — Progress Notes (Addendum)
Pulmonary Critical Care Medicine [facility] [place]   PULMONARY CRITICAL CARE SERVICE  PROGRESS NOTE  Date of Service: 04/21/2020  Cody Hale  EXB:[id number]  DOB: 03-Jun-1951   DOA: 03/30/2020  Referring Physician: Carron Curie, MD  HPI: Cody Hale is a 69 y.o. male seen for follow up of Acute on Chronic Respiratory Failure.  Patient mains on 1 L nasal cannula this time satting well no distress.  Medications: Reviewed on Rounds  Physical Exam:  Vitals: Pulse 65 respirations 20 BP 145/67 O2 sat 96% temp 98.1  Ventilator Settings not currently on ventilator  . General: Comfortable at this time . Eyes: Grossly normal lids, irises & conjunctiva . ENT: grossly tongue is normal . Neck: no obvious mass . Cardiovascular: S1 S2 normal no gallop . Respiratory: No rales or rhonchi noted . Abdomen: soft . Skin: no rash seen on limited exam . Musculoskeletal: not rigid . Psychiatric:unable to assess . Neurologic: no seizure no involuntary movements         Lab Data:   Basic Metabolic Panel: Recent Labs  Lab 04/15/20 0437 04/18/20 0535 04/20/20 0427  NA 137 141 139  K 3.7 4.1 4.6  CL 98 97* 96*  CO2 32 34* 35*  GLUCOSE 184* 156* 166*  BUN 8 18 17   CREATININE 0.55* 0.57* 0.47*  CALCIUM 8.5* 9.2 9.1  MG  --  1.8 1.9  PHOS  --  2.3* 3.7    ABG: Recent Labs  Lab 04/17/20 0330 04/17/20 0610 04/17/20 1022  PHART 7.293* 7.212* 7.389  PCO2ART 72.5* 84.7* 56.3*  PO2ART 52.7* 96.5 86.7  HCO3 34.5* 33.5* 33.3*  O2SAT 86.5 96.9 97.5    Liver Function Tests: Recent Labs  Lab 04/18/20 0535 04/20/20 0427  ALBUMIN 2.6* 2.5*   No results for input(s): LIPASE, AMYLASE in the last 168 hours. No results for input(s): AMMONIA in the last 168 hours.  CBC: Recent Labs  Lab 04/15/20 0437 04/18/20 0535 04/20/20 0427  WBC 3.4* 4.7 3.9*  HGB 11.2* 12.2* 10.7*  HCT 37.9* 38.9* 36.6*  MCV 93.1 93.1 94.1  PLT 144* 150 134*    Cardiac Enzymes: No results  for input(s): CKTOTAL, CKMB, CKMBINDEX, TROPONINI in the last 168 hours.  BNP (last 3 results) Recent Labs    05/06/19 1552 06/03/19 2149 04/05/20 0053  BNP 326.0* 368.9* 184.4*    ProBNP (last 3 results) No results for input(s): PROBNP in the last 8760 hours.  Radiological Exams: DG Chest 1 View  Result Date: 04/19/2020 CLINICAL DATA:  Post left thoracentesis EXAM: CHEST  1 VIEW COMPARISON:  Earlier same day. FINDINGS: Decreased left pleural effusion and improved left basilar lung aeration post thoracentesis. No pneumothorax. Similar right pleural effusion. Similar patchy bilateral opacities. Stable heart size. IMPRESSION: Decreased left pleural effusion and improved aeration at the left lung base post thoracentesis. No pneumothorax. Otherwise unchanged appearance. Electronically Signed   By: 04/21/2020 M.D.   On: 04/19/2020 11:53   DG CHEST PORT 1 VIEW  Result Date: 04/21/2020 CLINICAL DATA:  Pleural effusion no pneumothorax. Thoracentesis yesterday. EXAM: PORTABLE CHEST 1 VIEW COMPARISON:  One-view chest x-ray 04/19/2020 FINDINGS: Heart is enlarged. Bilateral interstitial edema scratched at bilateral interstitial and airspace disease is present. Bilateral pleural effusions are stable. No pneumothorax is present. Enteric tube courses off the inferior border of the film. IMPRESSION: 1. Stable cardiomegaly and bilateral interstitial and airspace disease compatible with congestive heart failure. 2. No pneumothorax. Electronically Signed   By: 04/21/2020.D.  On: 04/21/2020 04:49   IR THORACENTESIS ASP PLEURAL SPACE W/IMG GUIDE  Result Date: 04/19/2020 INDICATION: Patient with bilateral pleural effusions. Request made for left therapeutic thoracentesis. EXAM: ULTRASOUND GUIDED LEFT THORACENTESIS MEDICATIONS: 10 ML 1% LIDOCAINE COMPLICATIONS: None immediate. PROCEDURE: An ultrasound guided thoracentesis was thoroughly discussed with the patient and questions answered. The  benefits, risks, alternatives and complications were also discussed. The patient understands and wishes to proceed with the procedure. Written consent was obtained. Ultrasound was performed to localize and mark an adequate pocket of fluid in the left chest. The area was then prepped and draped in the normal sterile fashion. 1% Lidocaine was used for local anesthesia. A 22g spinal needle was used for administration of lidocaine. Under ultrasound guidance a 6 Fr Safe-T-Centesis catheter was introduced. Thoracentesis was performed. The catheter was removed and a dressing applied. FINDINGS: A total of approximately 1.5 liters of amber fluid was removed. Samples were sent to the laboratory as requested by the clinical team. IMPRESSION: Successful ultrasound guided therapeutic left thoracentesis yielding 1.5 liters of pleural fluid. Read by: Loyce Dys PA-C Electronically Signed   By: Irish Lack M.D.   On: 04/19/2020 11:29    Assessment/Plan Active Problems:   Acute on chronic respiratory failure with hypoxia (HCC)   Severe sepsis (HCC)   Chronic combined systolic and diastolic heart failure (HCC)   Chronic atrial fibrillation (HCC)   Sleep apnea   COPD, severe (HCC)   1. Acute on chronic respiratory failure hypoxiapatient continues on nasal cannula 1 L during the day is supposed to be on BiPAP at night however is refusing at this time we will continue supportive measures as well as pulmonary toilet. 2. Severe sepsis resolved hemodynamics are stable. 3. Chronic combined systolic and diastolic heart failure compensated 4. Chronic atrial fibrillation rate is controlled 5. Sleep apnea patient does have a history of sleep apnea will benefit from ongoing BiPAP use. 6. Severe COPD medical management   I have personally seen and evaluated the patient, evaluated laboratory and imaging results, formulated the assessment and plan and placed orders. The Patient requires high complexity decision making  with multiple systems involvement.  Rounds were done with the Respiratory Therapy Director and Staff therapists and discussed with nursing staff also.  Yevonne Pax, MD [facility] Pulmonary Critical Care Medicine Sleep Medicine

## 2020-04-22 NOTE — Progress Notes (Signed)
Pulmonary Critical Care Medicine [facility] [place]   PULMONARY CRITICAL CARE SERVICE  PROGRESS NOTE  Date of Service: 04/22/2020  Cody Hale  RDE:[id number]  DOB: 1950/10/01   DOA: 03/30/2020  Referring Physician: Carron Curie, MD  HPI: Cody Hale is a 69 y.o. male seen for follow up of Acute on Chronic Respiratory Failure.  Currently patient is on BiPAP 16/5 will be going on nasal cannula  Medications: Reviewed on Rounds  Physical Exam:  Vitals: Temperature 98.2 pulse 56 respiratory rate is 20 blood pressure is 128/56 saturations 97%  Ventilator Settings BiPAP at nighttime nasal cannula during daytime  . General: Comfortable at this time . Eyes: Grossly normal lids, irises & conjunctiva . ENT: grossly tongue is normal . Neck: no obvious mass . Cardiovascular: S1 S2 normal no gallop . Respiratory: Scattered rhonchi expansion is equal . Abdomen: soft . Skin: no rash seen on limited exam . Musculoskeletal: not rigid . Psychiatric:unable to assess . Neurologic: no seizure no involuntary movements         Lab Data:   Basic Metabolic Panel: Recent Labs  Lab 04/18/20 0535 04/20/20 0427  NA 141 139  K 4.1 4.6  CL 97* 96*  CO2 34* 35*  GLUCOSE 156* 166*  BUN 18 17  CREATININE 0.57* 0.47*  CALCIUM 9.2 9.1  MG 1.8 1.9  PHOS 2.3* 3.7    ABG: Recent Labs  Lab 04/17/20 0330 04/17/20 0610 04/17/20 1022  PHART 7.293* 7.212* 7.389  PCO2ART 72.5* 84.7* 56.3*  PO2ART 52.7* 96.5 86.7  HCO3 34.5* 33.5* 33.3*  O2SAT 86.5 96.9 97.5    Liver Function Tests: Recent Labs  Lab 04/18/20 0535 04/20/20 0427  ALBUMIN 2.6* 2.5*   No results for input(s): LIPASE, AMYLASE in the last 168 hours. No results for input(s): AMMONIA in the last 168 hours.  CBC: Recent Labs  Lab 04/18/20 0535 04/20/20 0427  WBC 4.7 3.9*  HGB 12.2* 10.7*  HCT 38.9* 36.6*  MCV 93.1 94.1  PLT 150 134*    Cardiac Enzymes: No results for input(s): CKTOTAL, CKMB,  CKMBINDEX, TROPONINI in the last 168 hours.  BNP (last 3 results) Recent Labs    05/06/19 1552 06/03/19 2149 04/05/20 0053  BNP 326.0* 368.9* 184.4*    ProBNP (last 3 results) No results for input(s): PROBNP in the last 8760 hours.  Radiological Exams: DG CHEST PORT 1 VIEW  Result Date: 04/21/2020 CLINICAL DATA:  Pleural effusion no pneumothorax. Thoracentesis yesterday. EXAM: PORTABLE CHEST 1 VIEW COMPARISON:  One-view chest x-ray 04/19/2020 FINDINGS: Heart is enlarged. Bilateral interstitial edema scratched at bilateral interstitial and airspace disease is present. Bilateral pleural effusions are stable. No pneumothorax is present. Enteric tube courses off the inferior border of the film. IMPRESSION: 1. Stable cardiomegaly and bilateral interstitial and airspace disease compatible with congestive heart failure. 2. No pneumothorax. Electronically Signed   By: Marin Roberts M.D.   On: 04/21/2020 04:49    Assessment/Plan Active Problems:   Acute on chronic respiratory failure with hypoxia (HCC)   Severe sepsis (HCC)   Chronic combined systolic and diastolic heart failure (HCC)   Chronic atrial fibrillation (HCC)   Sleep apnea   COPD, severe (HCC)   1. Acute on chronic respiratory failure hypoxia we will continue with the nasal cannula during daytime resting on BiPAP at night 2. Severe sepsis resolved 3. Chronic combined systolic diastolic heart failure supportive care. 4. Chronic atrial fibrillation rate is controlled 5. Sleep apnea using BiPAP at night 6. Severe COPD  medical management   I have personally seen and evaluated the patient, evaluated laboratory and imaging results, formulated the assessment and plan and placed orders. The Patient requires high complexity decision making with multiple systems involvement.  Rounds were done with the Respiratory Therapy Director and Staff therapists and discussed with nursing staff also.  Yevonne Pax, MD [facility] Pulmonary  Critical Care Medicine Sleep Medicine

## 2020-04-22 NOTE — Consult Note (Signed)
Ref: Shayne Alken, MD   Subjective:  Low heart rate of 40's without symptoms. Heart rate improves to 50's with increasing leg motion for 30 seconds. BP is stable. Off B-blocker and Calcium channel blockers.   Objective:  Vital Signs in the last 24 hours:  P: 48, R: 18, BP : 130/60  Physical Exam: BP Readings from Last 1 Encounters:  03/30/20 133/64     Wt Readings from Last 1 Encounters:  03/30/20 100.9 kg    Weight change:  There is no height or weight on file to calculate BMI. HEENT: [place]/AT, Eyes-Blue, Conjunctiva-Pale pink, Sclera-Non-icteric Neck: No JVD, No bruit, Trachea midline. Lungs:  Clearing, Bilateral. Cardiac:  Regular rhythm, normal S1 and S2, no S3. II/VI systolic murmur. Abdomen:  Soft, non-tender. BS present. Extremities:  Trace edema present. No cyanosis. No clubbing. Bilateral feet soft heel protectors and dressing. CNS: AxOx2, Cranial nerves grossly intact, moves all 4 extremities.  Skin: Warm and dry.   Intake/Output from previous day: No intake/output data recorded.    Lab Results: BMET    Component Value Date/Time   NA 139 04/20/2020 0427   NA 141 04/18/2020 0535   NA 137 04/15/2020 0437   K 4.6 04/20/2020 0427   K 4.1 04/18/2020 0535   K 3.7 04/15/2020 0437   CL 96 (L) 04/20/2020 0427   CL 97 (L) 04/18/2020 0535   CL 98 04/15/2020 0437   CO2 35 (H) 04/20/2020 0427   CO2 34 (H) 04/18/2020 0535   CO2 32 04/15/2020 0437   GLUCOSE 166 (H) 04/20/2020 0427   GLUCOSE 156 (H) 04/18/2020 0535   GLUCOSE 184 (H) 04/15/2020 0437   BUN 17 04/20/2020 0427   BUN 18 04/18/2020 0535   BUN 8 04/15/2020 0437   CREATININE 0.47 (L) 04/20/2020 0427   CREATININE 0.57 (L) 04/18/2020 0535   CREATININE 0.55 (L) 04/15/2020 0437   CREATININE 1.07 03/01/2020 1507   CREATININE 1.17 12/23/2017 0714   CREATININE 1.33 (H) 12/15/2017 0759   CALCIUM 9.1 04/20/2020 0427   CALCIUM 9.2 04/18/2020 0535   CALCIUM 8.5 (L) 04/15/2020 0437   GFRNONAA >60  04/20/2020 0427   GFRNONAA >60 04/18/2020 0535   GFRNONAA >60 04/15/2020 0437   GFRNONAA 70 03/01/2020 1507   GFRAA >60 04/09/2020 0348   GFRAA >60 04/06/2020 0808   GFRAA >60 04/05/2020 0053   GFRAA 82 03/01/2020 1507   CBC    Component Value Date/Time   WBC 3.9 (L) 04/20/2020 0427   RBC 3.89 (L) 04/20/2020 0427   HGB 10.7 (L) 04/20/2020 0427   HCT 36.6 (L) 04/20/2020 0427   PLT 134 (L) 04/20/2020 0427   MCV 94.1 04/20/2020 0427   MCH 27.5 04/20/2020 0427   MCHC 29.2 (L) 04/20/2020 0427   RDW 16.1 (H) 04/20/2020 0427   LYMPHSABS 0.5 (L) 03/31/2020 1351   MONOABS 0.4 03/31/2020 1351   EOSABS 0.1 03/31/2020 1351   BASOSABS 0.0 03/31/2020 1351   HEPATIC Function Panel Recent Labs    03/27/20 0559 03/31/20 1351 04/05/20 0053  PROT 5.6* 5.8* 6.0*   HEMOGLOBIN A1C No components found for: HGA1C,  MPG CARDIAC ENZYMES Lab Results  Component Value Date   CKTOTAL 26 (L) 04/05/2020   CKMB 0.6 04/05/2020   TROPONINI 0.03 (HH) 05/19/2018   TROPONINI 0.03 (HH) 05/19/2018   TROPONINI 0.04 (HH) 05/16/2018   BNP No results for input(s): PROBNP in the last 8760 hours. TSH Recent Labs    05/24/19 1808 03/01/20 1507  TSH  0.485 1.14   CHOLESTEROL Recent Labs    03/01/20 1507  CHOL 77    Scheduled Meds: Continuous Infusions: PRN Meds:.lidocaine (PF)  Assessment/Plan: Chronic atrial fibrillation with occasional high grade AV block. Acute on chronic respiratory failure Bioprosthetic MV Sepsis COPD Type 2 DM S/P feet wounds  Continue monitoring.   LOS: 0 days   Time spent including chart review, lab review, examination, discussion with patient, tech, PA : 30 min   Orpah Cobb  MD  04/22/2020, 11:50 AM

## 2020-04-23 ENCOUNTER — Other Ambulatory Visit (HOSPITAL_COMMUNITY): Payer: Self-pay

## 2020-04-23 LAB — BASIC METABOLIC PANEL
Anion gap: 5 (ref 5–15)
BUN: 18 mg/dL (ref 8–23)
CO2: 38 mmol/L — ABNORMAL HIGH (ref 22–32)
Calcium: 9.1 mg/dL (ref 8.9–10.3)
Chloride: 95 mmol/L — ABNORMAL LOW (ref 98–111)
Creatinine, Ser: 0.47 mg/dL — ABNORMAL LOW (ref 0.61–1.24)
GFR, Estimated: >60 mL/min (ref >60)
Glucose, Bld: 158 mg/dL — ABNORMAL HIGH (ref 70–99)
Potassium: 5.1 mmol/L (ref 3.5–5.1)
Sodium: 138 mmol/L (ref 135–145)

## 2020-04-23 LAB — CBC
HCT: 37.3 % — ABNORMAL LOW (ref 39.0–52.0)
Hemoglobin: 11.1 g/dL — ABNORMAL LOW (ref 13.0–17.0)
MCH: 27.8 pg (ref 26.0–34.0)
MCHC: 29.8 g/dL — ABNORMAL LOW (ref 30.0–36.0)
MCV: 93.5 fL (ref 80.0–100.0)
Platelets: 138 10*3/uL — ABNORMAL LOW (ref 150–400)
RBC: 3.99 MIL/uL — ABNORMAL LOW (ref 4.22–5.81)
RDW: 16 % — ABNORMAL HIGH (ref 11.5–15.5)
WBC: 5.3 10*3/uL (ref 4.0–10.5)
nRBC: 0 % (ref 0.0–0.2)

## 2020-04-23 LAB — PHOSPHORUS: Phosphorus: 3.5 mg/dL (ref 2.5–4.6)

## 2020-04-23 LAB — MAGNESIUM: Magnesium: 2.2 mg/dL (ref 1.7–2.4)

## 2020-04-23 NOTE — Progress Notes (Addendum)
Pulmonary Critical Care Medicine [facility] [place]   PULMONARY CRITICAL CARE SERVICE  PROGRESS NOTE  Date of Service: 04/23/2020  Cody Hale  JME:[id number]  DOB: [birth year]   DOA: 03/30/2020  Referring Physician: Carron Curie, MD  HPI: Cody Hale is a 69 y.o. male seen for follow up of Acute on Chronic Respiratory Failure.  Patient remains on 3 L nasal cannula during the day resting on BiPAP at night currently satting well no distress.  Medications: Reviewed on Rounds  Physical Exam:  Vitals: Pulse 65 respirations 20 BP 157/78 O2 sat 98% temp 97.6  Ventilator Settings 3 L nasal cannula  . General: Comfortable at this time . Eyes: Grossly normal lids, irises & conjunctiva . ENT: grossly tongue is normal . Neck: no obvious mass . Cardiovascular: S1 S2 normal no gallop . Respiratory: No rales or rhonchi noted . Abdomen: soft . Skin: no rash seen on limited exam . Musculoskeletal: not rigid . Psychiatric:unable to assess . Neurologic: no seizure no involuntary movements         Lab Data:   Basic Metabolic Panel: Recent Labs  Lab 04/18/20 0535 04/20/20 0427 04/23/20 0400  NA 141 139 138  K 4.1 4.6 5.1  CL 97* 96* 95*  CO2 34* 35* 38*  GLUCOSE 156* 166* 158*  BUN 18 17 18   CREATININE 0.57* 0.47* 0.47*  CALCIUM 9.2 9.1 9.1  MG 1.8 1.9 2.2  PHOS 2.3* 3.7 3.5    ABG: Recent Labs  Lab 04/17/20 0330 04/17/20 0610 04/17/20 1022  PHART 7.293* 7.212* 7.389  PCO2ART 72.5* 84.7* 56.3*  PO2ART 52.7* 96.5 86.7  HCO3 34.5* 33.5* 33.3*  O2SAT 86.5 96.9 97.5    Liver Function Tests: Recent Labs  Lab 04/18/20 0535 04/20/20 0427  ALBUMIN 2.6* 2.5*   No results for input(s): LIPASE, AMYLASE in the last 168 hours. No results for input(s): AMMONIA in the last 168 hours.  CBC: Recent Labs  Lab 04/18/20 0535 04/20/20 0427 04/23/20 0400  WBC 4.7 3.9* 5.3  HGB 12.2* 10.7* 11.1*  HCT 38.9* 36.6* 37.3*  MCV 93.1 94.1 93.5  PLT 150 134* 138*     Cardiac Enzymes: No results for input(s): CKTOTAL, CKMB, CKMBINDEX, TROPONINI in the last 168 hours.  BNP (last 3 results) Recent Labs    05/06/19 1552 06/03/19 2149 04/05/20 0053  BNP 326.0* 368.9* 184.4*    ProBNP (last 3 results) No results for input(s): PROBNP in the last 8760 hours.  Radiological Exams: DG CHEST PORT 1 VIEW  Result Date: 04/23/2020 CLINICAL DATA:  Pleural effusion EXAM: PORTABLE CHEST 1 VIEW COMPARISON:  04/21/2020 FINDINGS: Layering moderate bilateral pleural effusions, right greater than left. Associated lower lobe opacities, likely atelectasis. No pneumothorax. This appearance is unchanged. Heart is top-normal in size. Enteric tube likely courses below the diaphragm but is poorly evaluated. Median sternotomy. Lumbar spine fixation hardware, incompletely visualized. IMPRESSION: Layering moderate bilateral pleural effusions, right greater than left, unchanged. Associated lower lobe opacities, likely atelectasis. Electronically Signed   By: 04/23/2020 M.D.   On: 04/23/2020 06:11    Assessment/Plan Active Problems:   Acute on chronic respiratory failure with hypoxia (HCC)   Severe sepsis (HCC)   Chronic combined systolic and diastolic heart failure (HCC)   Chronic atrial fibrillation (HCC)   Sleep apnea   COPD, severe (HCC)   1. Acute on chronic respiratory failure hypoxia patient will continue on 3 L during the day resting on BiPAP at night we will continue supportive measures pulmonary  toilet. 2. Severe sepsis resolved 3. Chronic combined systolic diastolic heart failure supportive care. 4. Chronic atrial fibrillation rate is controlled 5. Sleep apnea using BiPAP at night 6. Severe COPD medical management   I have personally seen and evaluated the patient, evaluated laboratory and imaging results, formulated the assessment and plan and placed orders. The Patient requires high complexity decision making with multiple systems involvement.   Rounds were done with the Respiratory Therapy Director and Staff therapists and discussed with nursing staff also.  Yevonne Pax, MD [facility] [place] Pulmonary Critical Care Medicine Sleep Medicine

## 2020-04-23 NOTE — Consult Note (Addendum)
Ref: Shayne Alken, MD   Subjective:  Heart rate in 40's to 50's at rest without symptoms.  Objective:  Vital Signs in the last 24 hours:  P: 54, R: 18, BP : 120/60, O2 sat 100 %.  Physical Exam: BP Readings from Last 1 Encounters:  03/30/20 133/64     Wt Readings from Last 1 Encounters:  03/30/20 100.9 kg    Weight change:  There is no height or weight on file to calculate BMI. HEENT: [place]/AT, Eyes-Blue, PERL, EOMI, Conjunctiva-Pale pink, Sclera-Non-icteric Neck: No JVD, No bruit, Trachea midline. Lungs:  Clear, Bilateral. Cardiac:  Regular rhythm, normal S1 and S2, no S3. II/VI systolic murmur. Abdomen:  Soft, non-tender. BS present. Extremities:  Trace edema present. No cyanosis. No clubbing. CNS: AxOx2, Cranial nerves grossly intact, moves all 4 extremities.  Skin: Warm and dry.   Intake/Output from previous day: No intake/output data recorded.    Lab Results: BMET    Component Value Date/Time   NA 138 04/23/2020 0400   NA 139 04/20/2020 0427   NA 141 04/18/2020 0535   K 5.1 04/23/2020 0400   K 4.6 04/20/2020 0427   K 4.1 04/18/2020 0535   CL 95 (L) 04/23/2020 0400   CL 96 (L) 04/20/2020 0427   CL 97 (L) 04/18/2020 0535   CO2 38 (H) 04/23/2020 0400   CO2 35 (H) 04/20/2020 0427   CO2 34 (H) 04/18/2020 0535   GLUCOSE 158 (H) 04/23/2020 0400   GLUCOSE 166 (H) 04/20/2020 0427   GLUCOSE 156 (H) 04/18/2020 0535   BUN 18 04/23/2020 0400   BUN 17 04/20/2020 0427   BUN 18 04/18/2020 0535   CREATININE 0.47 (L) 04/23/2020 0400   CREATININE 0.47 (L) 04/20/2020 0427   CREATININE 0.57 (L) 04/18/2020 0535   CREATININE 1.07 03/01/2020 1507   CREATININE 1.17 12/23/2017 0714   CREATININE 1.33 (H) 12/15/2017 0759   CALCIUM 9.1 04/23/2020 0400   CALCIUM 9.1 04/20/2020 0427   CALCIUM 9.2 04/18/2020 0535   GFRNONAA >60 04/23/2020 0400   GFRNONAA >60 04/20/2020 0427   GFRNONAA >60 04/18/2020 0535   GFRNONAA 70 03/01/2020 1507   GFRAA >60 04/09/2020 0348    GFRAA >60 04/06/2020 0808   GFRAA >60 04/05/2020 0053   GFRAA 82 03/01/2020 1507   CBC    Component Value Date/Time   WBC 5.3 04/23/2020 0400   RBC 3.99 (L) 04/23/2020 0400   HGB 11.1 (L) 04/23/2020 0400   HCT 37.3 (L) 04/23/2020 0400   PLT 138 (L) 04/23/2020 0400   MCV 93.5 04/23/2020 0400   MCH 27.8 04/23/2020 0400   MCHC 29.8 (L) 04/23/2020 0400   RDW 16.0 (H) 04/23/2020 0400   LYMPHSABS 0.5 (L) 03/31/2020 1351   MONOABS 0.4 03/31/2020 1351   EOSABS 0.1 03/31/2020 1351   BASOSABS 0.0 03/31/2020 1351   HEPATIC Function Panel Recent Labs    03/27/20 0559 03/31/20 1351 04/05/20 0053  PROT 5.6* 5.8* 6.0*   HEMOGLOBIN A1C No components found for: HGA1C,  MPG CARDIAC ENZYMES Lab Results  Component Value Date   CKTOTAL 26 (L) 04/05/2020   CKMB 0.6 04/05/2020   TROPONINI 0.03 (HH) 05/19/2018   TROPONINI 0.03 (HH) 05/19/2018   TROPONINI 0.04 (HH) 05/16/2018   BNP No results for input(s): PROBNP in the last 8760 hours. 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 Acute on chronic respiratory failure Bioprosthetic MV Sepsis COPD Type 2 DM S/P bilateral feet wounds  Continue medical treatment.   LOS: 0 days   Time spent including chart review, lab review, examination, discussion with patient : 25 min   Orpah Cobb  MD  04/23/2020, 11:06 AM

## 2020-04-24 ENCOUNTER — Other Ambulatory Visit (HOSPITAL_COMMUNITY): Payer: Self-pay

## 2020-04-24 LAB — BASIC METABOLIC PANEL
Anion gap: 10 (ref 5–15)
BUN: 19 mg/dL (ref 8–23)
CO2: 35 mmol/L — ABNORMAL HIGH (ref 22–32)
Calcium: 8.9 mg/dL (ref 8.9–10.3)
Chloride: 93 mmol/L — ABNORMAL LOW (ref 98–111)
Creatinine, Ser: 0.49 mg/dL — ABNORMAL LOW (ref 0.61–1.24)
GFR, Estimated: >60 mL/min (ref >60)
Glucose, Bld: 135 mg/dL — ABNORMAL HIGH (ref 70–99)
Potassium: 4.7 mmol/L (ref 3.5–5.1)
Sodium: 138 mmol/L (ref 135–145)

## 2020-04-24 LAB — MAGNESIUM: Magnesium: 1.9 mg/dL (ref 1.7–2.4)

## 2020-04-24 NOTE — Progress Notes (Signed)
Pulmonary Critical Care Medicine [facility] [place]   PULMONARY CRITICAL CARE SERVICE  PROGRESS NOTE  Date of Service: 04/24/2020  Cody Hale  MBT:[id number]  DOB: Nov 14, 1950   DOA: 03/30/2020  Referring Physician: Carron Curie, MD  HPI: Cody Hale is a 69 y.o. male seen for follow up of Acute on Chronic Respiratory Failure.  Currently on BiPAP has been on 2 L of oxygen during the daytime  Medications: Reviewed on Rounds  Physical Exam:  Vitals: Temperature is 98.3 pulse 60 respiratory 26 blood pressure is 130/61 saturations 99%  Ventilator Settings on BiPAP at nighttime  . General: Comfortable at this time . Eyes: Grossly normal lids, irises & conjunctiva . ENT: grossly tongue is normal . Neck: no obvious mass . Cardiovascular: S1 S2 normal no gallop . Respiratory: No rhonchi no rales noted . Abdomen: soft . Skin: no rash seen on limited exam . Musculoskeletal: not rigid . Psychiatric:unable to assess . Neurologic: no seizure no involuntary movements         Lab Data:   Basic Metabolic Panel: Recent Labs  Lab 04/18/20 0535 04/20/20 0427 04/23/20 0400 04/24/20 0603  NA 141 139 138 138  K 4.1 4.6 5.1 4.7  CL 97* 96* 95* 93*  CO2 34* 35* 38* 35*  GLUCOSE 156* 166* 158* 135*  BUN 18 17 18 19   CREATININE 0.57* 0.47* 0.47* 0.49*  CALCIUM 9.2 9.1 9.1 8.9  MG 1.8 1.9 2.2 1.9  PHOS 2.3* 3.7 3.5  --     ABG: Recent Labs  Lab 04/17/20 1022  PHART 7.389  PCO2ART 56.3*  PO2ART 86.7  HCO3 33.3*  O2SAT 97.5    Liver Function Tests: Recent Labs  Lab 04/18/20 0535 04/20/20 0427  ALBUMIN 2.6* 2.5*   No results for input(s): LIPASE, AMYLASE in the last 168 hours. No results for input(s): AMMONIA in the last 168 hours.  CBC: Recent Labs  Lab 04/18/20 0535 04/20/20 0427 04/23/20 0400  WBC 4.7 3.9* 5.3  HGB 12.2* 10.7* 11.1*  HCT 38.9* 36.6* 37.3*  MCV 93.1 94.1 93.5  PLT 150 134* 138*    Cardiac Enzymes: No results for input(s):  CKTOTAL, CKMB, CKMBINDEX, TROPONINI in the last 168 hours.  BNP (last 3 results) Recent Labs    05/06/19 1552 06/03/19 2149 04/05/20 0053  BNP 326.0* 368.9* 184.4*    ProBNP (last 3 results) No results for input(s): PROBNP in the last 8760 hours.  Radiological Exams: DG CHEST PORT 1 VIEW  Result Date: 04/24/2020 CLINICAL DATA:  Pleural effusion EXAM: PORTABLE CHEST 1 VIEW COMPARISON:  04/23/2020 FINDINGS: Gastric tube courses through in off the field of the radiograph, tip below the LEFT hemidiaphragm. Image rotated to the LEFT. Heart size and mediastinal contours partially obscured and grossly stable accounting for rotation and partial obscuration. Increased interstitial markings bilaterally. The graded opacity in the LEFT and RIGHT hemithorax, greatest on the LEFT similar to the prior exam. Basilar airspace disease as before. On limited assessment no acute skeletal process. Spinal fusion hardware as before. This is incompletely imaged. Subacute fractures of LEFT-sided ribs are similar to the prior study. IMPRESSION: 1. Bilateral effusions and atelectasis/airspace disease, similar to the prior exam. 2. Increased interstitial markings bilaterally, likely pulmonary edema also unchanged. 3. Subacute fractures of LEFT-sided ribs. Electronically Signed   By: 04/25/2020 M.D.   On: 04/24/2020 07:34   DG CHEST PORT 1 VIEW  Result Date: 04/23/2020 CLINICAL DATA:  Pleural effusion EXAM: PORTABLE CHEST 1 VIEW COMPARISON:  04/21/2020 FINDINGS: Layering moderate bilateral pleural effusions, right greater than left. Associated lower lobe opacities, likely atelectasis. No pneumothorax. This appearance is unchanged. Heart is top-normal in size. Enteric tube likely courses below the diaphragm but is poorly evaluated. Median sternotomy. Lumbar spine fixation hardware, incompletely visualized. IMPRESSION: Layering moderate bilateral pleural effusions, right greater than left, unchanged. Associated lower  lobe opacities, likely atelectasis. Electronically Signed   By: Charline Bills M.D.   On: 04/23/2020 06:11    Assessment/Plan Active Problems:   Acute on chronic respiratory failure with hypoxia (HCC)   Severe sepsis (HCC)   Chronic combined systolic and diastolic heart failure (HCC)   Chronic atrial fibrillation (HCC)   Sleep apnea   COPD, severe (HCC)   1. Acute on chronic respiratory failure hypoxia we'll continue with the BiPAP at night titrate as tolerated. 2. Severe sepsis resolved 3. Chronic combined systolic diastolic heart failure supportive care. 4. Chronic atrial fibrillation rate is controlled. 5. Sleep apnea continue with the BiPAP 6. Severe COPD medical management   I have personally seen and evaluated the patient, evaluated laboratory and imaging results, formulated the assessment and plan and placed orders. The Patient requires high complexity decision making with multiple systems involvement.  Rounds were done with the Respiratory Therapy Director and Staff therapists and discussed with nursing staff also.  Yevonne Pax, MD [facility] Pulmonary Critical Care Medicine Sleep Medicine

## 2020-04-25 LAB — RENAL FUNCTION PANEL
Albumin: 2.6 g/dL — ABNORMAL LOW (ref 3.5–5.0)
Anion gap: 9 (ref 5–15)
BUN: 19 mg/dL (ref 8–23)
CO2: 40 mmol/L — ABNORMAL HIGH (ref 22–32)
Calcium: 9.3 mg/dL (ref 8.9–10.3)
Chloride: 93 mmol/L — ABNORMAL LOW (ref 98–111)
Creatinine, Ser: 0.64 mg/dL (ref 0.61–1.24)
GFR, Estimated: >60 mL/min (ref >60)
Glucose, Bld: 126 mg/dL — ABNORMAL HIGH (ref 70–99)
Phosphorus: 3.7 mg/dL (ref 2.5–4.6)
Potassium: 4.5 mmol/L (ref 3.5–5.1)
Sodium: 142 mmol/L (ref 135–145)

## 2020-04-25 LAB — MAGNESIUM: Magnesium: 1.9 mg/dL (ref 1.7–2.4)

## 2020-04-25 LAB — CBC
HCT: 36.1 % — ABNORMAL LOW (ref 39.0–52.0)
Hemoglobin: 10.6 g/dL — ABNORMAL LOW (ref 13.0–17.0)
MCH: 27.5 pg (ref 26.0–34.0)
MCHC: 29.4 g/dL — ABNORMAL LOW (ref 30.0–36.0)
MCV: 93.5 fL (ref 80.0–100.0)
Platelets: 145 10*3/uL — ABNORMAL LOW (ref 150–400)
RBC: 3.86 MIL/uL — ABNORMAL LOW (ref 4.22–5.81)
RDW: 16 % — ABNORMAL HIGH (ref 11.5–15.5)
WBC: 3.5 10*3/uL — ABNORMAL LOW (ref 4.0–10.5)
nRBC: 0 % (ref 0.0–0.2)

## 2020-04-25 NOTE — Progress Notes (Addendum)
Pulmonary Critical Care Medicine [facility] [place]   PULMONARY CRITICAL CARE SERVICE  PROGRESS NOTE  Date of Service: 04/25/2020  Cody Hale  XFG:[id number]  DOB: 1951-06-28   DOA: 03/30/2020  Referring Physician: Carron Curie, MD  HPI: Cody Hale is a 69 y.o. male seen for follow up of Acute on Chronic Respiratory Failure.  Patient continues to do well on 1 L nasal cannula during the day resting on BiPAP at night satting well this time no distress.  Medications: Reviewed on Rounds  Physical Exam:  Vitals: Pulse 73 respirations 18 BP 167/86 O2 sat 98% temp 96.9  Ventilator Settings not currently on ventilator  . General: Comfortable at this time . Eyes: Grossly normal lids, irises & conjunctiva . ENT: grossly tongue is normal . Neck: no obvious mass . Cardiovascular: S1 S2 normal no gallop . Respiratory: No rales or rhonchi noted . Abdomen: soft . Skin: no rash seen on limited exam . Musculoskeletal: not rigid . Psychiatric:unable to assess . Neurologic: no seizure no involuntary movements         Lab Data:   Basic Metabolic Panel: Recent Labs  Lab 04/20/20 0427 04/23/20 0400 04/24/20 0603 04/25/20 0448  NA 139 138 138 142  K 4.6 5.1 4.7 4.5  CL 96* 95* 93* 93*  CO2 35* 38* 35* 40*  GLUCOSE 166* 158* 135* 126*  BUN 17 18 19 19   CREATININE 0.47* 0.47* 0.49* 0.64  CALCIUM 9.1 9.1 8.9 9.3  MG 1.9 2.2 1.9 1.9  PHOS 3.7 3.5  --  3.7    ABG: No results for input(s): PHART, PCO2ART, PO2ART, HCO3, O2SAT in the last 168 hours.  Liver Function Tests: Recent Labs  Lab 04/20/20 0427 04/25/20 0448  ALBUMIN 2.5* 2.6*   No results for input(s): LIPASE, AMYLASE in the last 168 hours. No results for input(s): AMMONIA in the last 168 hours.  CBC: Recent Labs  Lab 04/20/20 0427 04/23/20 0400 04/25/20 0448  WBC 3.9* 5.3 3.5*  HGB 10.7* 11.1* 10.6*  HCT 36.6* 37.3* 36.1*  MCV 94.1 93.5 93.5  PLT 134* 138* 145*    Cardiac Enzymes: No  results for input(s): CKTOTAL, CKMB, CKMBINDEX, TROPONINI in the last 168 hours.  BNP (last 3 results) Recent Labs    05/06/19 1552 06/03/19 2149 04/05/20 0053  BNP 326.0* 368.9* 184.4*    ProBNP (last 3 results) No results for input(s): PROBNP in the last 8760 hours.  Radiological Exams: DG CHEST PORT 1 VIEW  Result Date: 04/24/2020 CLINICAL DATA:  Pleural effusion EXAM: PORTABLE CHEST 1 VIEW COMPARISON:  04/23/2020 FINDINGS: Gastric tube courses through in off the field of the radiograph, tip below the LEFT hemidiaphragm. Image rotated to the LEFT. Heart size and mediastinal contours partially obscured and grossly stable accounting for rotation and partial obscuration. Increased interstitial markings bilaterally. The graded opacity in the LEFT and RIGHT hemithorax, greatest on the LEFT similar to the prior exam. Basilar airspace disease as before. On limited assessment no acute skeletal process. Spinal fusion hardware as before. This is incompletely imaged. Subacute fractures of LEFT-sided ribs are similar to the prior study. IMPRESSION: 1. Bilateral effusions and atelectasis/airspace disease, similar to the prior exam. 2. Increased interstitial markings bilaterally, likely pulmonary edema also unchanged. 3. Subacute fractures of LEFT-sided ribs. Electronically Signed   By: 04/25/2020 M.D.   On: 04/24/2020 07:34    Assessment/Plan Active Problems:   Acute on chronic respiratory failure with hypoxia (HCC)   Severe sepsis (HCC)  Chronic combined systolic and diastolic heart failure (HCC)   Chronic atrial fibrillation (HCC)   Sleep apnea   COPD, severe (HCC)   1. Acute on chronic respiratory failure hypoxia patient continue on 1 L nasal cannula during the day resting on BiPAP at night we will continue to titrate as tolerated.  Continue supportive measures. 2. Severe sepsis resolved 3. Chronic combined systolic diastolic heart failure supportive care. 4. Chronic atrial  fibrillation rate is controlled. 5. Sleep apnea continue with the BiPAP 6. Severe COPD medical management   I have personally seen and evaluated the patient, evaluated laboratory and imaging results, formulated the assessment and plan and placed orders. The Patient requires high complexity decision making with multiple systems involvement.  Rounds were done with the Respiratory Therapy Director and Staff therapists and discussed with nursing staff also.  Yevonne Pax, MD [facility] Pulmonary Critical Care Medicine Sleep Medicine

## 2020-04-26 ENCOUNTER — Other Ambulatory Visit (HOSPITAL_COMMUNITY): Payer: Self-pay

## 2020-04-26 LAB — SARS CORONAVIRUS 2 (TAT 6-24 HRS): SARS Coronavirus 2: NEGATIVE

## 2020-04-26 LAB — VANCOMYCIN, TROUGH: Vancomycin Tr: <4 ug/mL — ABNORMAL LOW (ref 15–20)

## 2020-04-26 NOTE — Consult Note (Signed)
Ref: Shayne Alken, MD   Subjective:  Awake. Off ventilator for few days now. Awaiting SNF placement. Echocardiogram with Severe LV systolic, moderate RV systolic dysfunction, moderate MS, Severe TR and possible need for TEE to r/o vegetation. Patient not ready for TEE, he just wants to go back to his old nursing home.  Objective:  Vital Signs in the last 24 hours:  P: 50 R: 20 BP: 160/80, O2 sat 98 %.  Physical Exam: BP Readings from Last 1 Encounters:  03/30/20 133/64     Wt Readings from Last 1 Encounters:  03/30/20 100.9 kg    Weight change:  There is no height or weight on file to calculate BMI. HEENT: [place]/AT, Eyes-Blue, Conjunctiva-Pale pink, Sclera-Non-icteric Neck: No JVD, No bruit, Tracheostomy capped. Lungs:  Clear, Bilateral. Cardiac:  Irregular rhythm, normal S1 and S2, no S3. II/VI systolic murmur. Abdomen:  Soft, non-tender. BS present. Extremities:  No edema present. No cyanosis. No clubbing. CNS: AxOx2, Cranial nerves grossly intact, moves all 4 extremities.  Skin: Warm and dry.   Intake/Output from previous day: No intake/output data recorded.    Lab Results: BMET    Component Value Date/Time   NA 142 04/25/2020 0448   NA 138 04/24/2020 0603   NA 138 04/23/2020 0400   K 4.5 04/25/2020 0448   K 4.7 04/24/2020 0603   K 5.1 04/23/2020 0400   CL 93 (L) 04/25/2020 0448   CL 93 (L) 04/24/2020 0603   CL 95 (L) 04/23/2020 0400   CO2 40 (H) 04/25/2020 0448   CO2 35 (H) 04/24/2020 0603   CO2 38 (H) 04/23/2020 0400   GLUCOSE 126 (H) 04/25/2020 0448   GLUCOSE 135 (H) 04/24/2020 0603   GLUCOSE 158 (H) 04/23/2020 0400   BUN 19 04/25/2020 0448   BUN 19 04/24/2020 0603   BUN 18 04/23/2020 0400   CREATININE 0.64 04/25/2020 0448   CREATININE 0.49 (L) 04/24/2020 0603   CREATININE 0.47 (L) 04/23/2020 0400   CREATININE 1.07 03/01/2020 1507   CREATININE 1.17 12/23/2017 0714   CREATININE 1.33 (H) 12/15/2017 0759   CALCIUM 9.3 04/25/2020 0448    CALCIUM 8.9 04/24/2020 0603   CALCIUM 9.1 04/23/2020 0400   GFRNONAA >60 04/25/2020 0448   GFRNONAA >60 04/24/2020 0603   GFRNONAA >60 04/23/2020 0400   GFRNONAA 70 03/01/2020 1507   GFRAA >60 04/09/2020 0348   GFRAA >60 04/06/2020 0808   GFRAA >60 04/05/2020 0053   GFRAA 82 03/01/2020 1507   CBC    Component Value Date/Time   WBC 3.5 (L) 04/25/2020 0448   RBC 3.86 (L) 04/25/2020 0448   HGB 10.6 (L) 04/25/2020 0448   HCT 36.1 (L) 04/25/2020 0448   PLT 145 (L) 04/25/2020 0448   MCV 93.5 04/25/2020 0448   MCH 27.5 04/25/2020 0448   MCHC 29.4 (L) 04/25/2020 0448   RDW 16.0 (H) 04/25/2020 0448   LYMPHSABS 0.5 (L) 03/31/2020 1351   MONOABS 0.4 03/31/2020 1351   EOSABS 0.1 03/31/2020 1351   BASOSABS 0.0 03/31/2020 1351   HEPATIC Function Panel Recent Labs    03/27/20 0559 03/31/20 1351 04/05/20 0053  PROT 5.6* 5.8* 6.0*   HEMOGLOBIN A1C No components found for: HGA1C,  MPG CARDIAC ENZYMES Lab Results  Component Value Date   CKTOTAL 26 (L) 04/05/2020   CKMB 0.6 04/05/2020   TROPONINI 0.03 (HH) 05/19/2018   TROPONINI 0.03 (HH) 05/19/2018   TROPONINI 0.04 (HH) 05/16/2018   BNP No results for input(s): PROBNP in the last  8760 hours. 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 Awaiting SNF placement. Repeat blood cultures are negative after prolonged antibiotics therapy. If becomes bacteremic again then reconsider TEE.    LOS: 0 days   Time spent including chart review, lab review, examination, discussion with patient and PA : 30 min   Orpah Cobb  MD  04/26/2020, 8:35  PM

## 2020-04-27 DIAGNOSIS — F419 Anxiety disorder, unspecified: Secondary | ICD-10-CM | POA: Diagnosis not present

## 2020-04-27 DIAGNOSIS — I10 Essential (primary) hypertension: Secondary | ICD-10-CM | POA: Diagnosis not present

## 2020-04-27 DIAGNOSIS — R279 Unspecified lack of coordination: Secondary | ICD-10-CM | POA: Diagnosis not present

## 2020-04-27 DIAGNOSIS — R001 Bradycardia, unspecified: Secondary | ICD-10-CM | POA: Diagnosis not present

## 2020-04-27 DIAGNOSIS — J189 Pneumonia, unspecified organism: Secondary | ICD-10-CM | POA: Diagnosis not present

## 2020-04-27 DIAGNOSIS — L89159 Pressure ulcer of sacral region, unspecified stage: Secondary | ICD-10-CM | POA: Diagnosis not present

## 2020-04-27 DIAGNOSIS — J9620 Acute and chronic respiratory failure, unspecified whether with hypoxia or hypercapnia: Secondary | ICD-10-CM | POA: Diagnosis not present

## 2020-04-27 DIAGNOSIS — Z23 Encounter for immunization: Secondary | ICD-10-CM | POA: Diagnosis not present

## 2020-04-27 DIAGNOSIS — I110 Hypertensive heart disease with heart failure: Secondary | ICD-10-CM | POA: Diagnosis not present

## 2020-04-27 DIAGNOSIS — I5032 Chronic diastolic (congestive) heart failure: Secondary | ICD-10-CM | POA: Diagnosis not present

## 2020-04-27 DIAGNOSIS — E7849 Other hyperlipidemia: Secondary | ICD-10-CM | POA: Diagnosis not present

## 2020-04-27 DIAGNOSIS — R2689 Other abnormalities of gait and mobility: Secondary | ICD-10-CM | POA: Diagnosis not present

## 2020-04-27 DIAGNOSIS — I5042 Chronic combined systolic (congestive) and diastolic (congestive) heart failure: Secondary | ICD-10-CM | POA: Diagnosis not present

## 2020-04-27 DIAGNOSIS — A4189 Other specified sepsis: Secondary | ICD-10-CM | POA: Diagnosis not present

## 2020-04-27 DIAGNOSIS — F339 Major depressive disorder, recurrent, unspecified: Secondary | ICD-10-CM | POA: Diagnosis not present

## 2020-04-27 DIAGNOSIS — L89153 Pressure ulcer of sacral region, stage 3: Secondary | ICD-10-CM | POA: Diagnosis not present

## 2020-04-27 DIAGNOSIS — I4819 Other persistent atrial fibrillation: Secondary | ICD-10-CM | POA: Diagnosis not present

## 2020-04-27 DIAGNOSIS — R1312 Dysphagia, oropharyngeal phase: Secondary | ICD-10-CM | POA: Diagnosis not present

## 2020-04-27 DIAGNOSIS — E46 Unspecified protein-calorie malnutrition: Secondary | ICD-10-CM | POA: Diagnosis not present

## 2020-04-27 DIAGNOSIS — M6281 Muscle weakness (generalized): Secondary | ICD-10-CM | POA: Diagnosis not present

## 2020-04-27 DIAGNOSIS — E119 Type 2 diabetes mellitus without complications: Secondary | ICD-10-CM | POA: Diagnosis not present

## 2020-04-27 DIAGNOSIS — R5381 Other malaise: Secondary | ICD-10-CM | POA: Diagnosis not present

## 2020-04-27 DIAGNOSIS — A409 Streptococcal sepsis, unspecified: Secondary | ICD-10-CM | POA: Diagnosis not present

## 2020-04-27 DIAGNOSIS — J9600 Acute respiratory failure, unspecified whether with hypoxia or hypercapnia: Secondary | ICD-10-CM | POA: Diagnosis not present

## 2020-04-27 DIAGNOSIS — L89890 Pressure ulcer of other site, unstageable: Secondary | ICD-10-CM | POA: Diagnosis not present

## 2020-04-27 DIAGNOSIS — I482 Chronic atrial fibrillation, unspecified: Secondary | ICD-10-CM | POA: Diagnosis not present

## 2020-04-27 DIAGNOSIS — J918 Pleural effusion in other conditions classified elsewhere: Secondary | ICD-10-CM | POA: Diagnosis not present

## 2020-04-27 DIAGNOSIS — J449 Chronic obstructive pulmonary disease, unspecified: Secondary | ICD-10-CM | POA: Diagnosis not present

## 2020-04-27 DIAGNOSIS — Z743 Need for continuous supervision: Secondary | ICD-10-CM | POA: Diagnosis not present

## 2020-05-15 ENCOUNTER — Other Ambulatory Visit: Payer: Self-pay | Admitting: *Deleted

## 2020-05-15 NOTE — Patient Outreach (Signed)
Member screened for potential [facility] Care Management needs as a benefit of [employer].  Per Patient Ping member resides in [facility].   Communication sent to [facility] SW to collaborate about anticipated dc plans and potential [facility] Care Management needs.  Will continue to follow while member resides in SNF.   Jacey Pelc, MSN-Ed, RN,BSN [facility] Post Acute Care Coordinator [phone] ( [employer]) [phone]  (Toll free office)  

## 2020-05-16 ENCOUNTER — Other Ambulatory Visit: Payer: Self-pay | Admitting: *Deleted

## 2020-05-16 NOTE — Patient Outreach (Signed)
[facility] Post- Acute Care Coordinator follow up.Member screened for potential [facility] Care Management needs as a benefit of [employer].  Update received from [facility] SNF SW indicating member's goal is to return to [facility]. He is progressing well with therapy.   No identifiable [facility] Care Management needs at this time.    Raiford Noble, MSN-Ed, RN,BSN [facility] Post Acute Care Coordinator [phone] [facility]) [phone]  (Toll free office)

## 2020-06-05 ENCOUNTER — Other Ambulatory Visit: Payer: Self-pay | Admitting: *Deleted

## 2020-06-05 NOTE — Patient Outreach (Signed)
[facility] Post- Acute Care Coordinator follow up. Member screened for potential [facility] Care Management needs as a benefit of [employer].  Per Patient Ping member resides in [facility].  Communication sent to [facility] SWs to request update on anticipated transition plans and potential [facility] Care Management needs.   Caleigh Rabelo, MSN, RN,BSN [facility] Post Acute Care Coordinator [phone] ( [employer]) [phone]  (Toll free office) 

## 2020-06-06 ENCOUNTER — Other Ambulatory Visit: Payer: Self-pay | Admitting: *Deleted

## 2020-06-06 NOTE — Patient Outreach (Signed)
[facility] Post- Acute Care Coordinator follow up. Member screened for potential [facility] Care Management needs as a benefit of [employer].  Update received from [facility] SN[facility] SW indicating transition plan remains to return to [facility]. Member progressing well with therapy.   No identifiable [facility] Care Management needs at this time.   Raiford Noble, MSN, RN,BSN [facility] Post Acute Care Coordinator [phone] [facility]) [phone]  (Toll free office)

## 2020-06-20 DIAGNOSIS — I10 Essential (primary) hypertension: Secondary | ICD-10-CM | POA: Diagnosis not present

## 2020-06-20 DIAGNOSIS — E7849 Other hyperlipidemia: Secondary | ICD-10-CM | POA: Diagnosis not present

## 2020-06-20 DIAGNOSIS — J449 Chronic obstructive pulmonary disease, unspecified: Secondary | ICD-10-CM | POA: Diagnosis not present

## 2020-06-20 DIAGNOSIS — I5032 Chronic diastolic (congestive) heart failure: Secondary | ICD-10-CM | POA: Diagnosis not present

## 2020-06-26 IMAGING — DX FACIAL BONES COMPLETE 3+V
4 series · 5 of 5 positions shown · non-contrast
Comparison: None.

CLINICAL DATA: Status post fall with left facial pain.

EXAM:
FACIAL BONES COMPLETE 3+V

[pa]
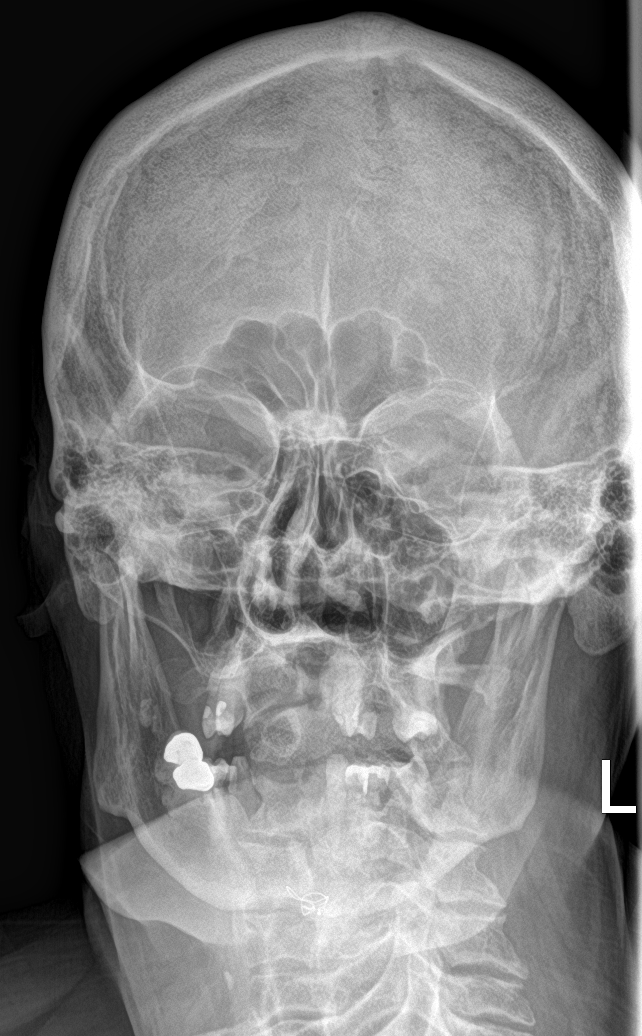

[townes]
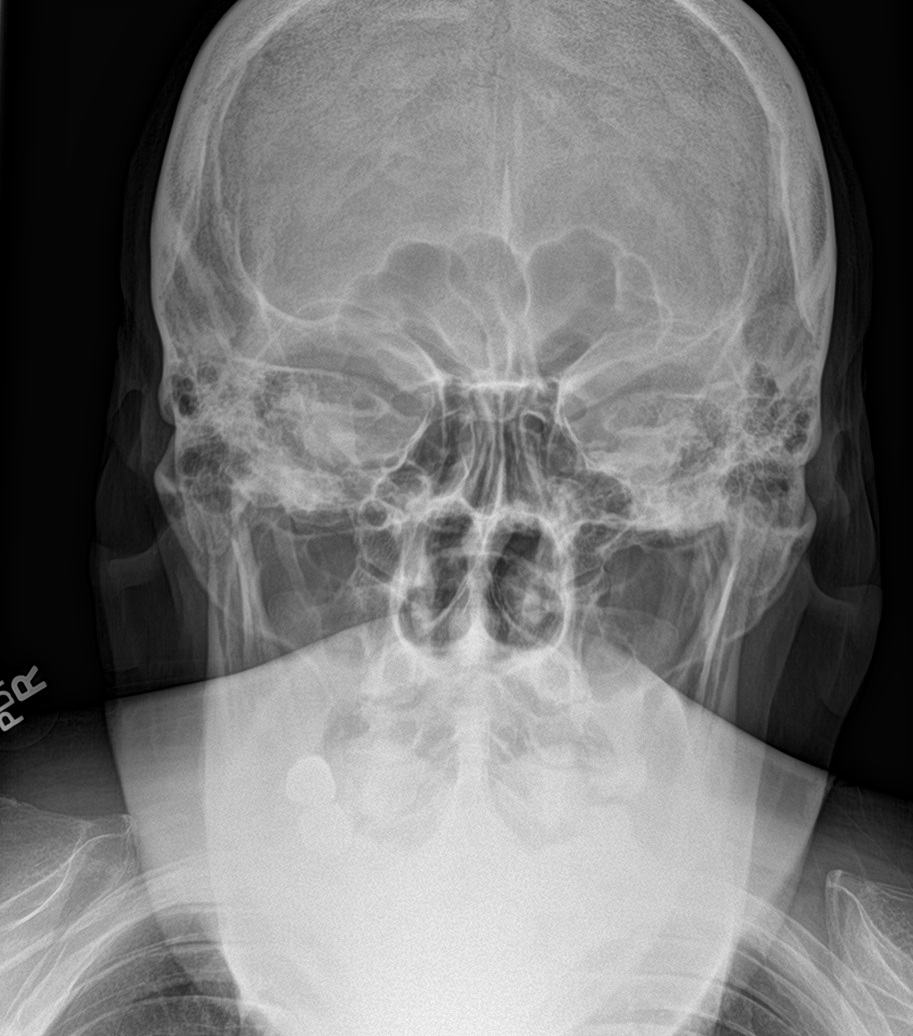

[waters]
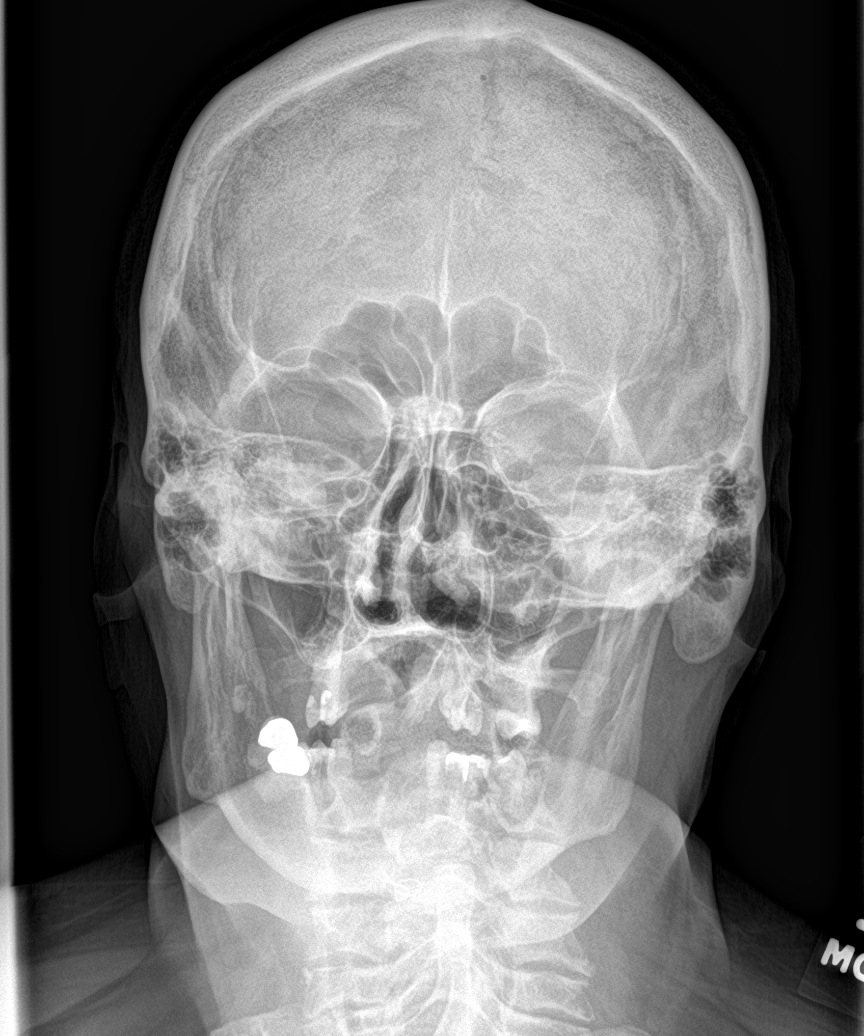

[Series 4: lateral · 0.14mm/px · 2 of 2 slices shown]
[im 1/2]
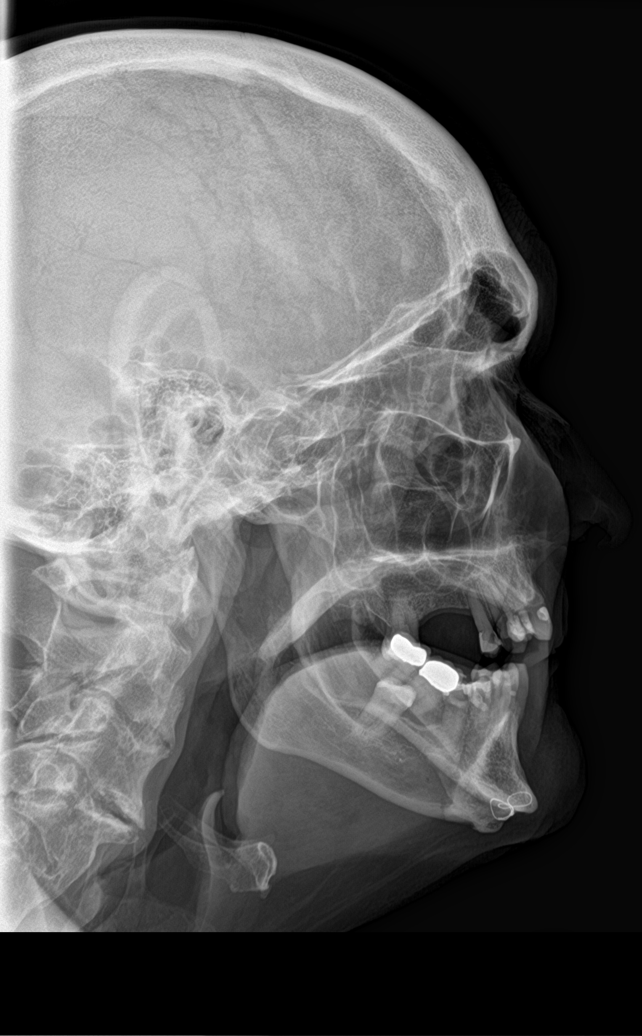
[im 2/2]
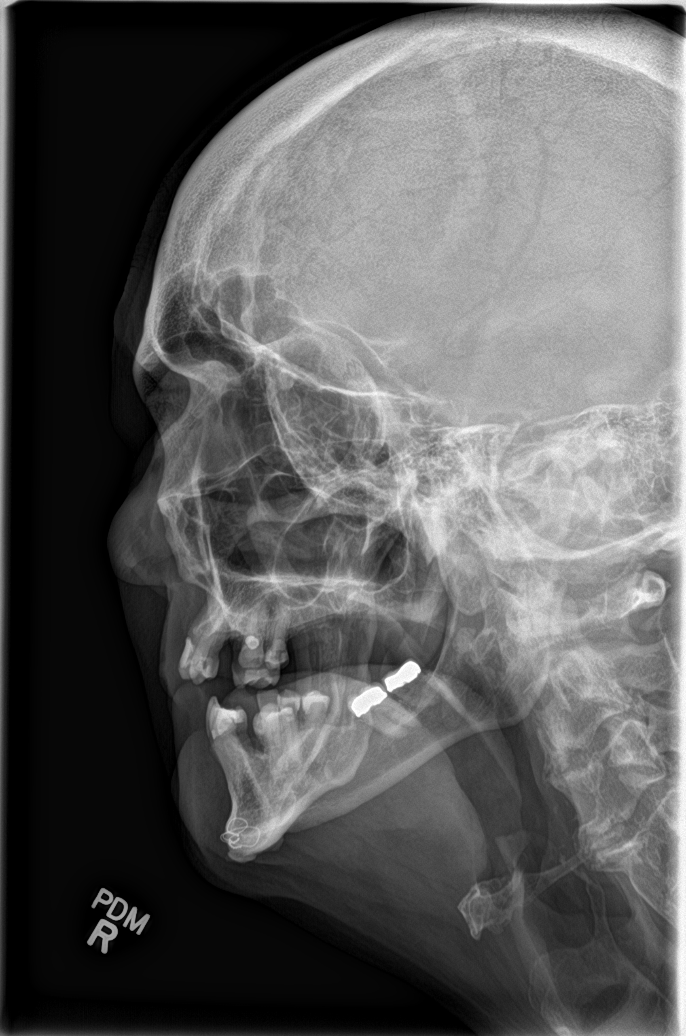

[5 of 5 positions shown; findings below may reference images not displayed]

FINDINGS: There is no evidence of fracture or dislocation. No orbital
emphysema or sinus air-fluid levels are seen.
IMPRESSION: No definite acute fracture or dislocation noted.

## 2020-06-26 IMAGING — DX CHEST - 2 VIEW
2 series · 2 of 2 positions shown · non-contrast
Comparison: August 05, 2018

CLINICAL DATA: Status post fall 5 days ago with left chest pain.

EXAM:
CHEST - 2 VIEW

[chest pa]
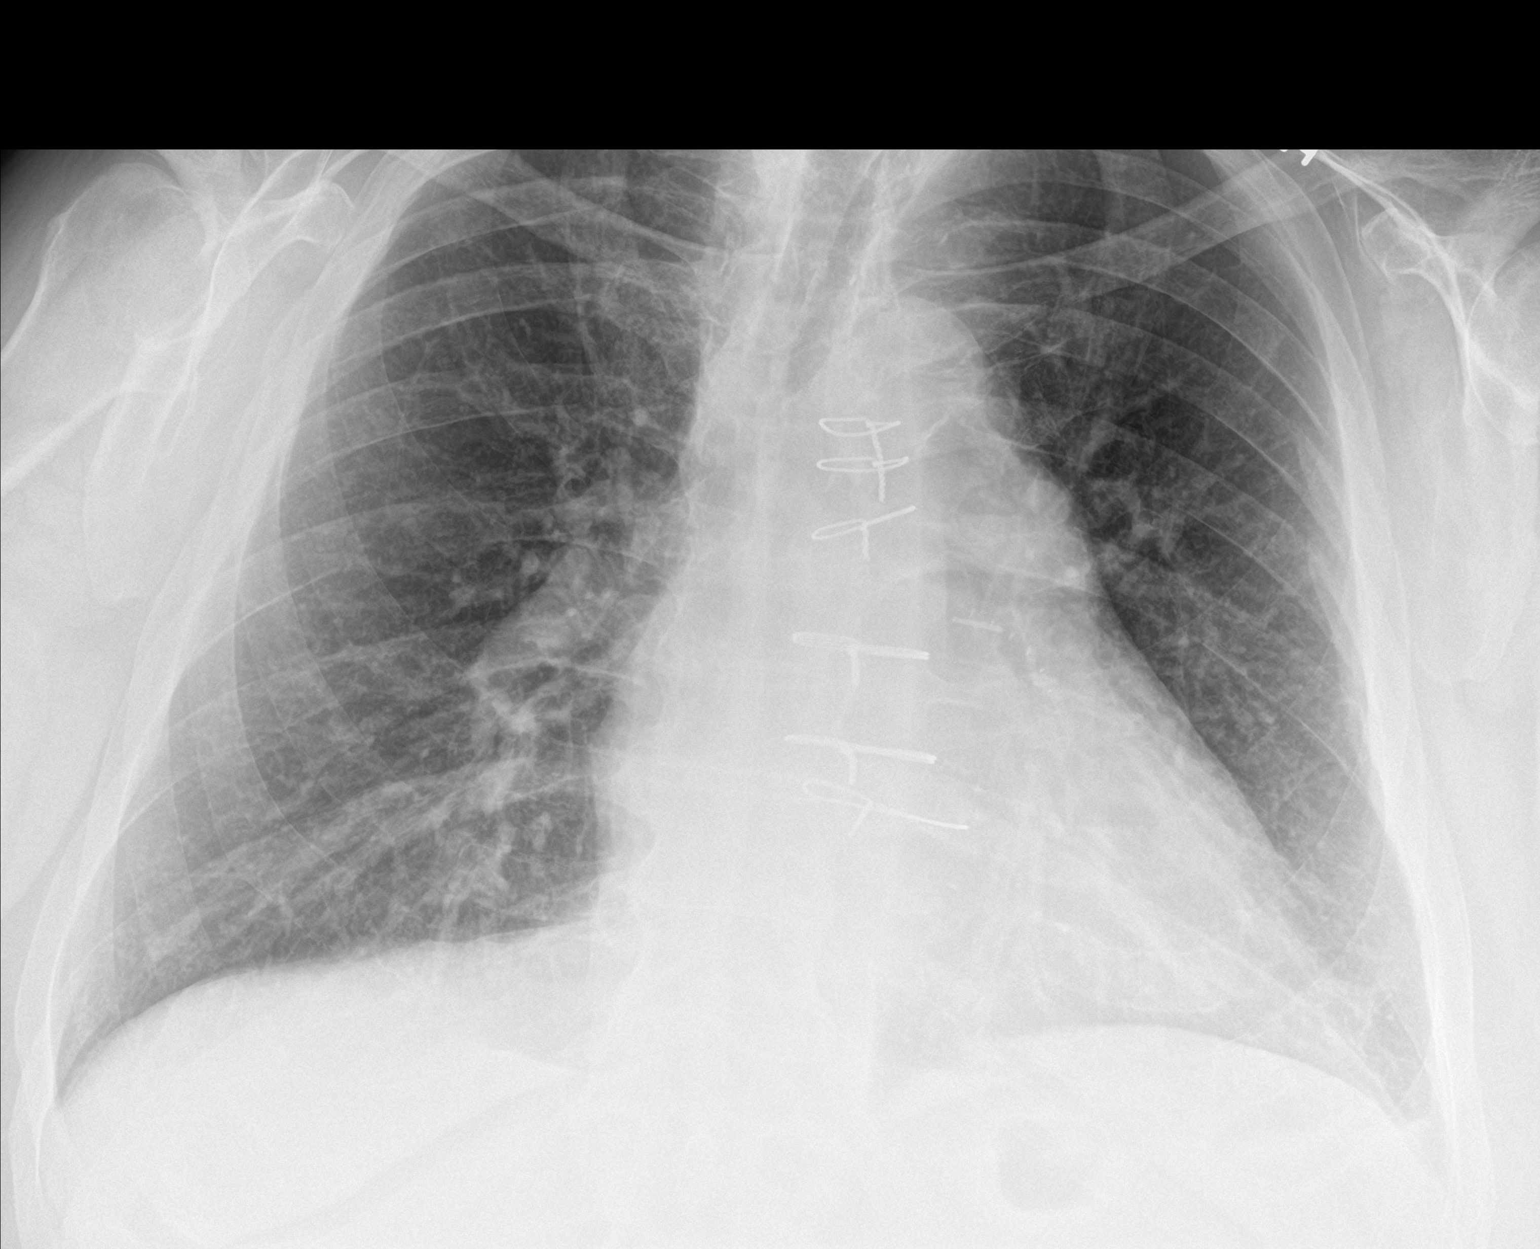

[chest lat]
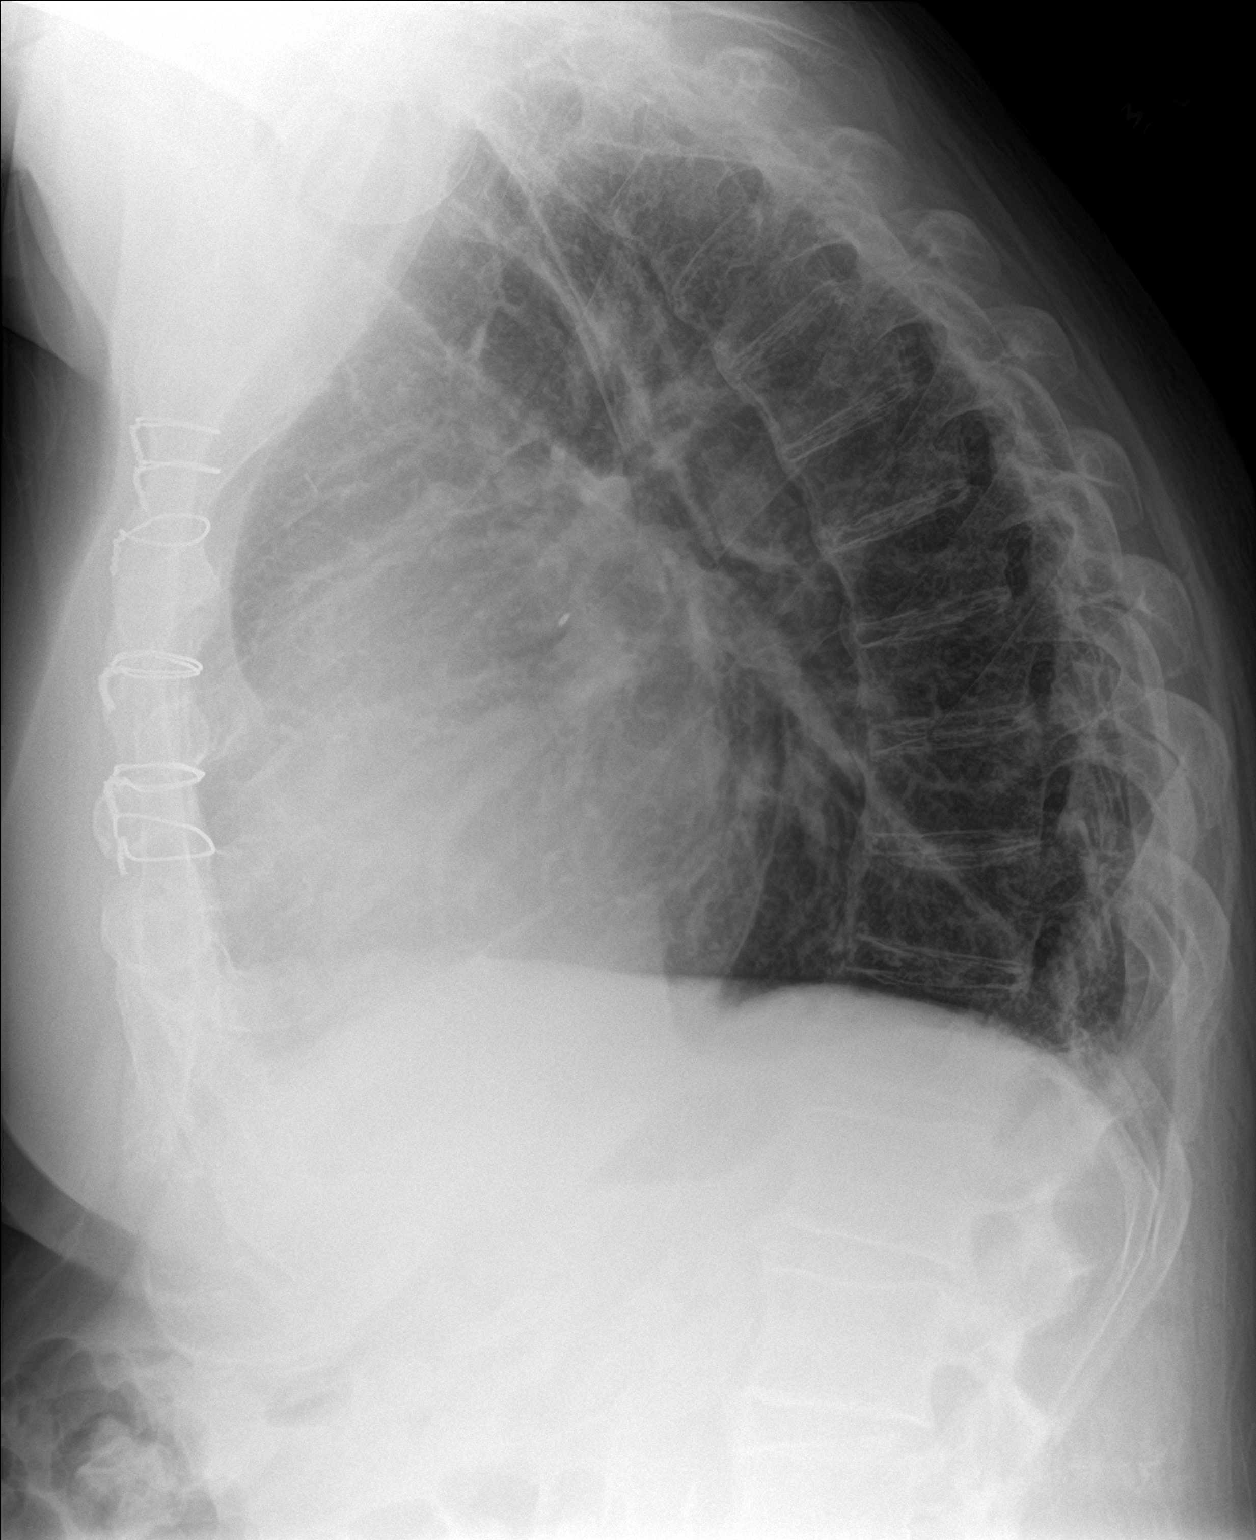

[2 of 2 positions shown; findings below may reference images not displayed]

FINDINGS: The heart size and mediastinal contours are stable. No focal
infiltrate, pulmonary edema, or pleural effusion is identified.
There is no pneumothorax. There are possible deformities of several
lateral left mid ribs fractures are not excluded.
IMPRESSION: Possible deformities of several lateral left mid ribs, fracture is
not excluded. Further evaluation with a left rib series is
recommended.

Lungs are clear.  There is no pneumothorax.

## 2020-06-26 IMAGING — DX LEFT KNEE - COMPLETE 4+ VIEW
4 series · 4 of 4 positions shown · non-contrast
Comparison: None.

CLINICAL DATA: Status post fall five days ago with left knee pain.

EXAM:
LEFT KNEE - COMPLETE 4+ VIEW

[knee ap]
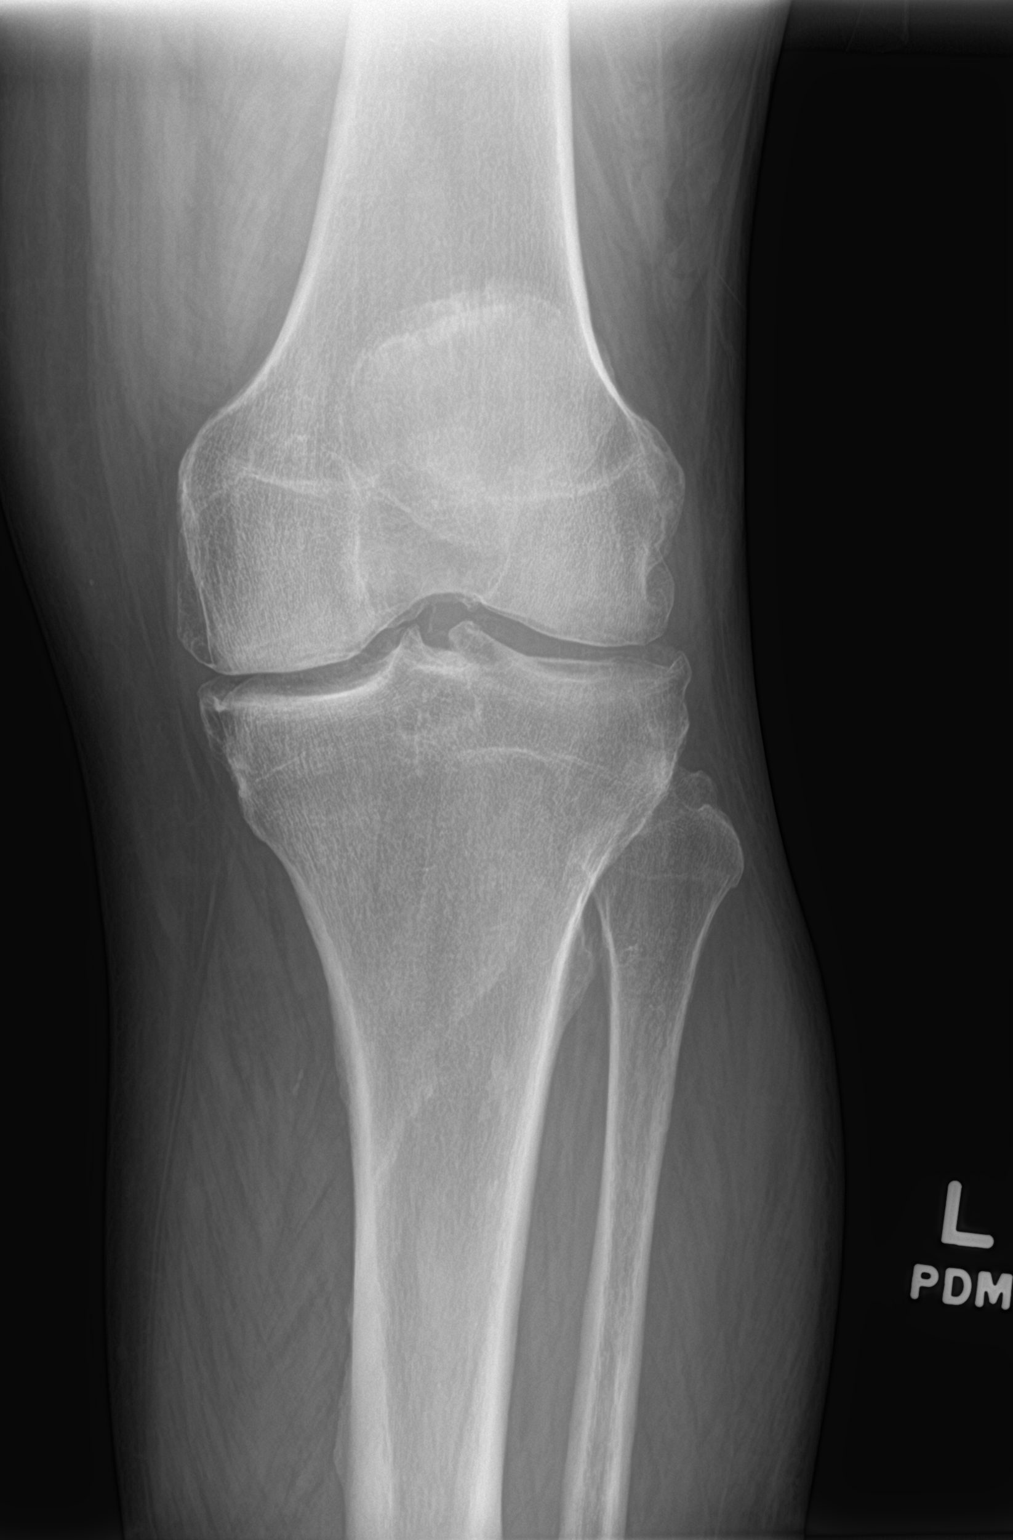

[knee lat]
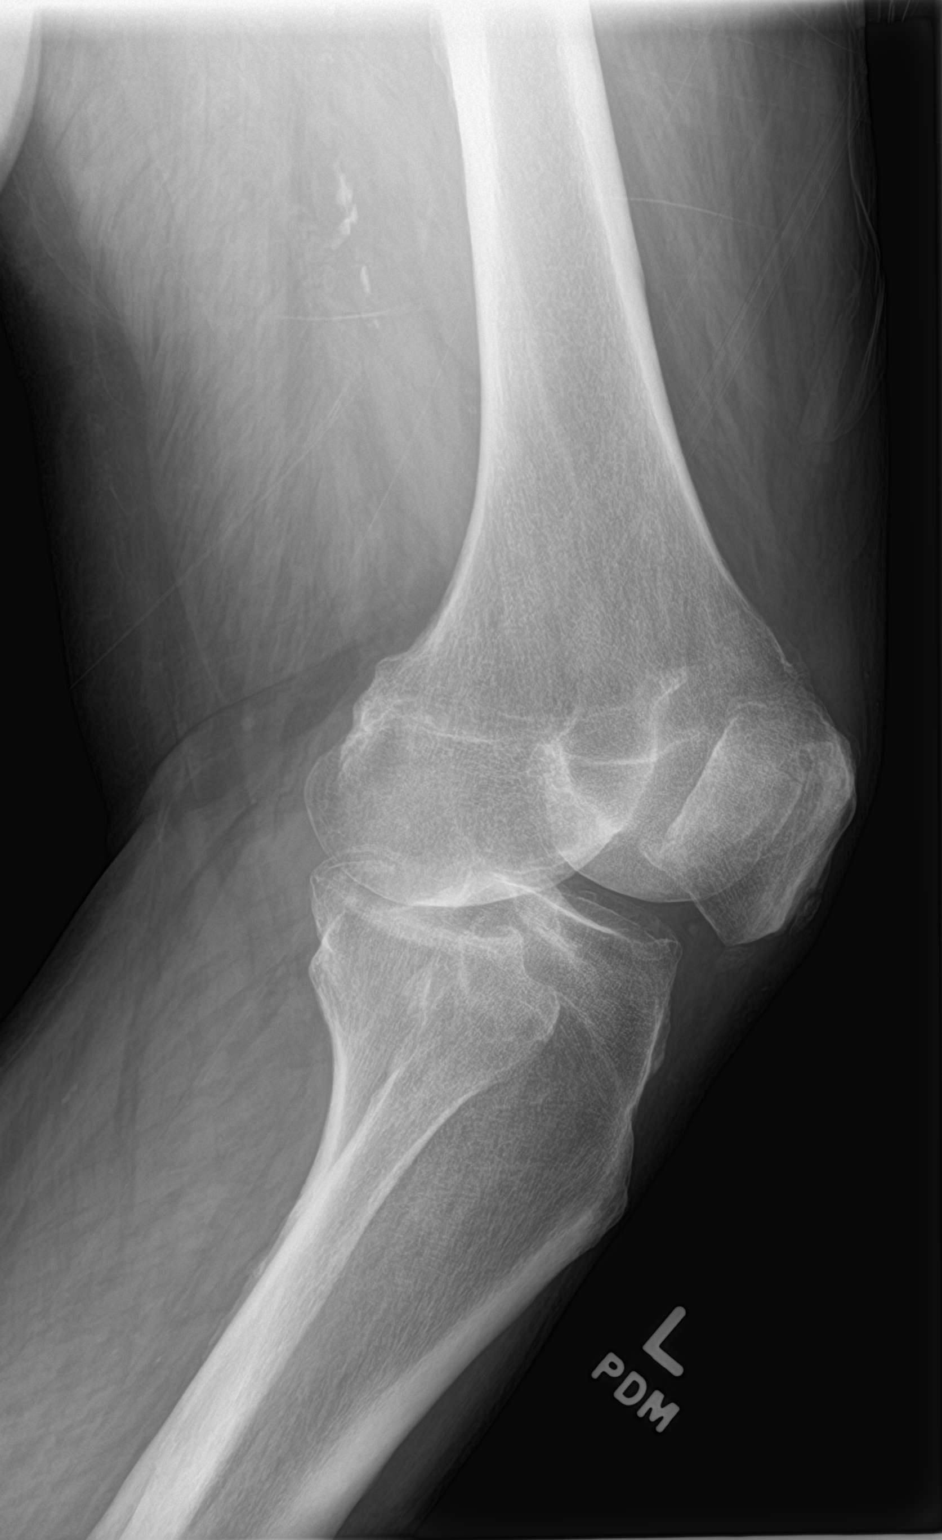

[knee obl (1 of 2)]
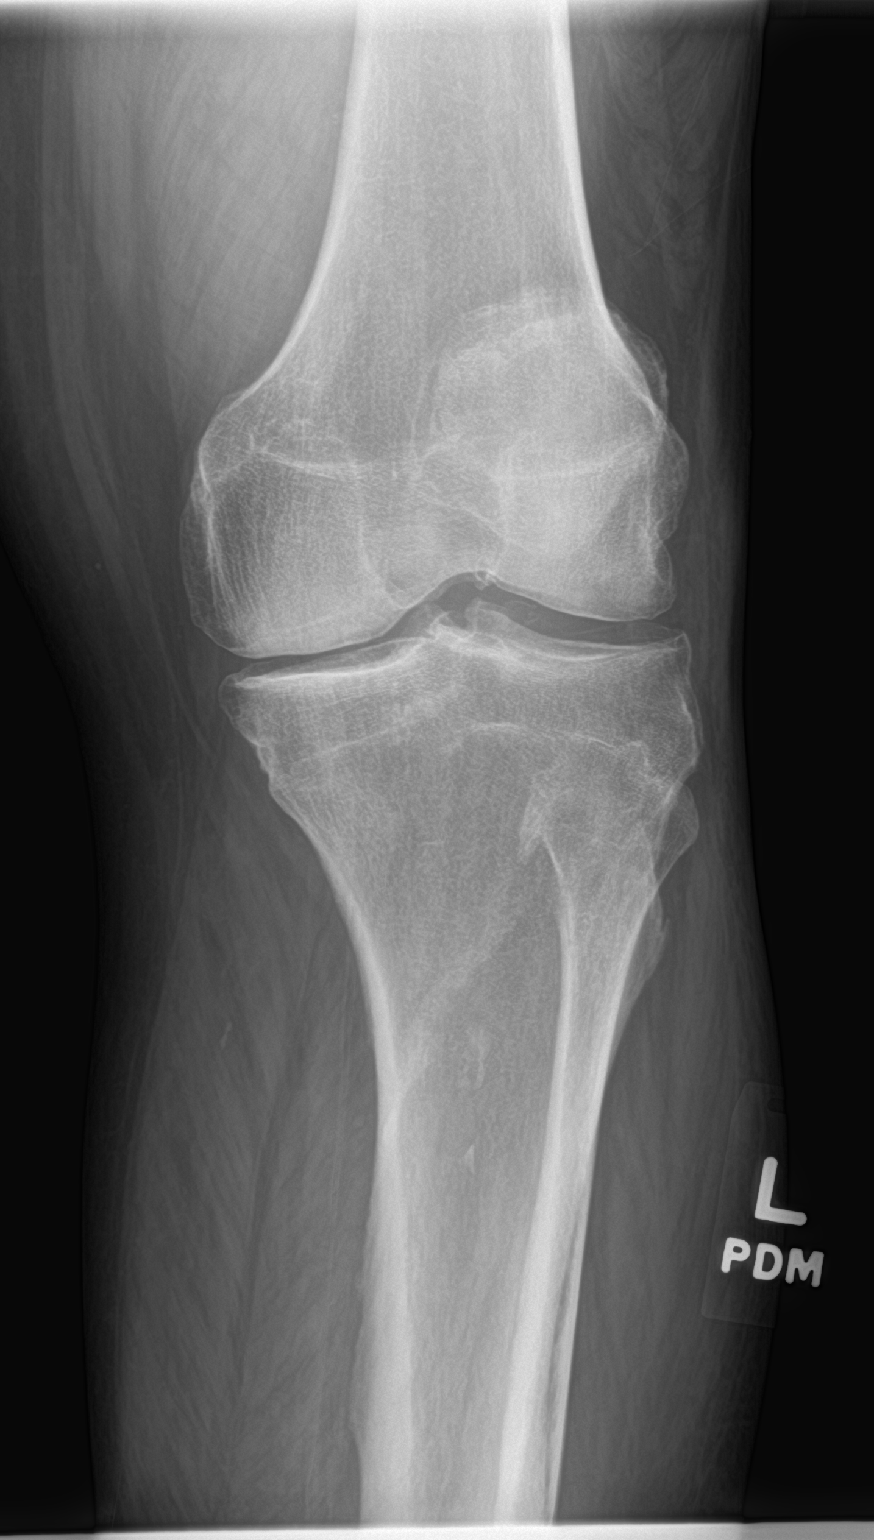

[knee obl (2 of 2)]
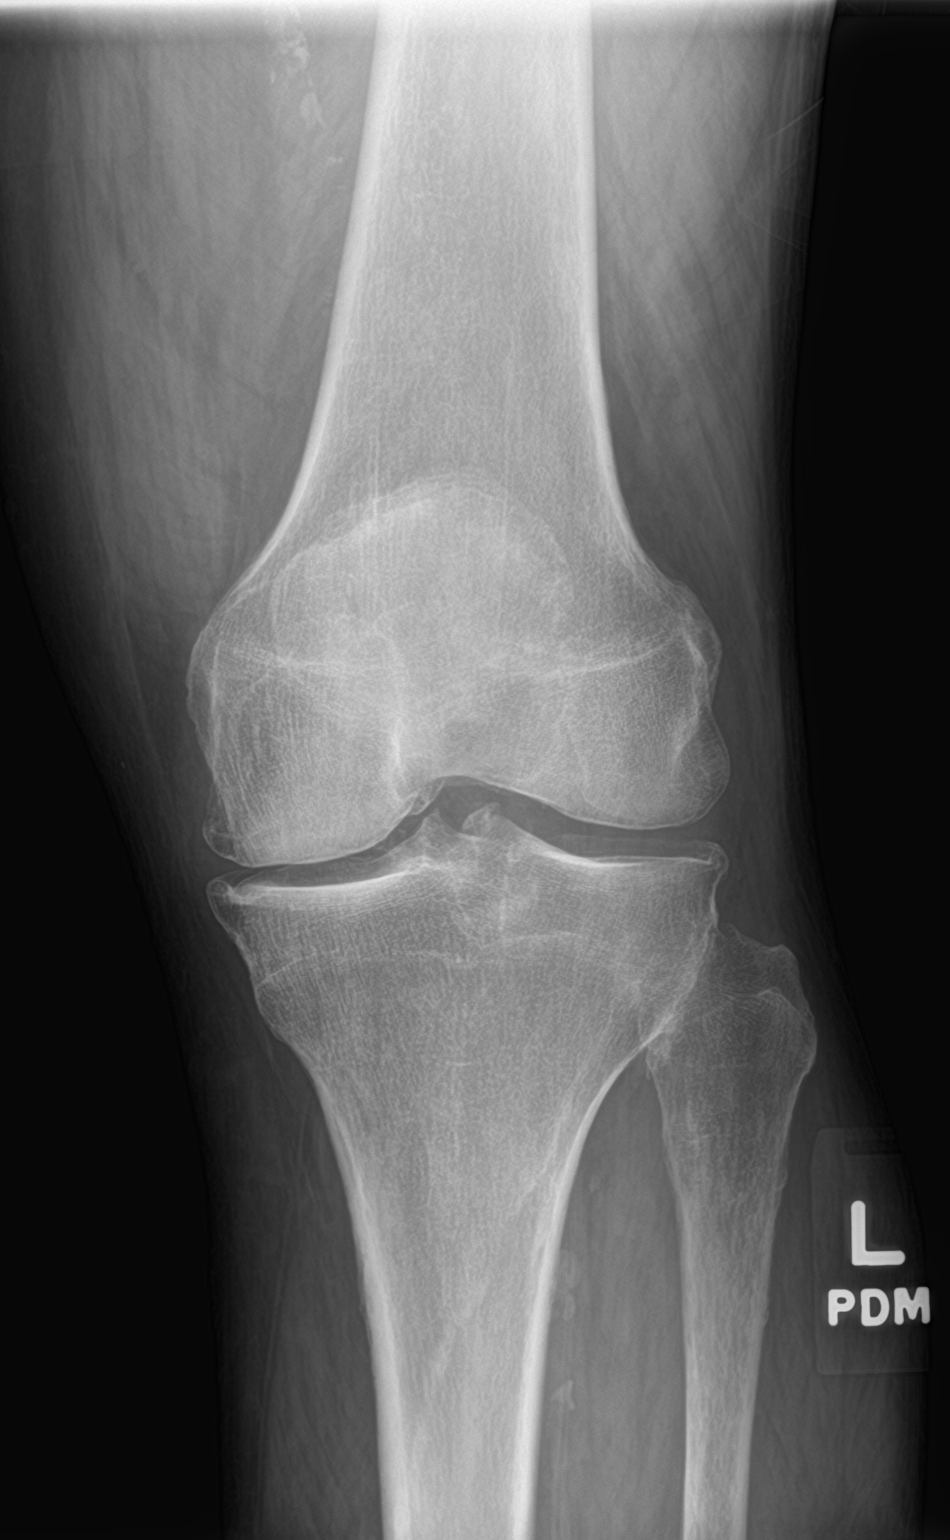

[4 of 4 positions shown; findings below may reference images not displayed]

FINDINGS: No evidence of fracture, dislocation, or joint effusion. Narrow
femoral tibial joint space with osteophyte formation are noted. Soft
tissues are unremarkable.
IMPRESSION: Osteoarthritic changes of left knee.

## 2020-07-08 DIAGNOSIS — J449 Chronic obstructive pulmonary disease, unspecified: Secondary | ICD-10-CM | POA: Diagnosis not present

## 2020-07-08 DIAGNOSIS — J189 Pneumonia, unspecified organism: Secondary | ICD-10-CM | POA: Diagnosis not present

## 2020-07-08 DIAGNOSIS — R2689 Other abnormalities of gait and mobility: Secondary | ICD-10-CM | POA: Diagnosis not present

## 2020-07-08 DIAGNOSIS — F339 Major depressive disorder, recurrent, unspecified: Secondary | ICD-10-CM | POA: Diagnosis not present

## 2020-07-08 DIAGNOSIS — L89153 Pressure ulcer of sacral region, stage 3: Secondary | ICD-10-CM | POA: Diagnosis not present

## 2020-07-08 DIAGNOSIS — I5042 Chronic combined systolic (congestive) and diastolic (congestive) heart failure: Secondary | ICD-10-CM | POA: Diagnosis not present

## 2020-07-08 DIAGNOSIS — L89890 Pressure ulcer of other site, unstageable: Secondary | ICD-10-CM | POA: Diagnosis not present

## 2020-07-08 DIAGNOSIS — E7849 Other hyperlipidemia: Secondary | ICD-10-CM | POA: Diagnosis not present

## 2020-07-08 DIAGNOSIS — I110 Hypertensive heart disease with heart failure: Secondary | ICD-10-CM | POA: Diagnosis not present

## 2020-07-08 DIAGNOSIS — I5032 Chronic diastolic (congestive) heart failure: Secondary | ICD-10-CM | POA: Diagnosis not present

## 2020-07-08 DIAGNOSIS — R1312 Dysphagia, oropharyngeal phase: Secondary | ICD-10-CM | POA: Diagnosis not present

## 2020-07-08 DIAGNOSIS — E119 Type 2 diabetes mellitus without complications: Secondary | ICD-10-CM | POA: Diagnosis not present

## 2020-07-08 DIAGNOSIS — I482 Chronic atrial fibrillation, unspecified: Secondary | ICD-10-CM | POA: Diagnosis not present

## 2020-07-08 DIAGNOSIS — F419 Anxiety disorder, unspecified: Secondary | ICD-10-CM | POA: Diagnosis not present

## 2020-07-08 DIAGNOSIS — E46 Unspecified protein-calorie malnutrition: Secondary | ICD-10-CM | POA: Diagnosis not present

## 2020-07-08 DIAGNOSIS — J918 Pleural effusion in other conditions classified elsewhere: Secondary | ICD-10-CM | POA: Diagnosis not present

## 2020-07-08 DIAGNOSIS — J9620 Acute and chronic respiratory failure, unspecified whether with hypoxia or hypercapnia: Secondary | ICD-10-CM | POA: Diagnosis not present

## 2020-07-08 DIAGNOSIS — I10 Essential (primary) hypertension: Secondary | ICD-10-CM | POA: Diagnosis not present

## 2020-07-08 DIAGNOSIS — M6281 Muscle weakness (generalized): Secondary | ICD-10-CM | POA: Diagnosis not present

## 2020-07-18 ENCOUNTER — Other Ambulatory Visit: Payer: Self-pay | Admitting: *Deleted

## 2020-07-18 NOTE — Patient Outreach (Signed)
[facility] Post- Acute Care Coordinator follow up. Mr. Kohrs remains in [facility] SN[facility].   Facility SW reports alternative transition plans have been discussed with Mr. Pflaum. However, member remains adamant to return to [facility] ALF.   Member will need reassessment prior to ALF return.   Raiford Noble, MSN, RN,BSN [facility] Post Acute Care Coordinator [phone] [facility]) [phone]  (Toll free office)

## 2020-07-20 DIAGNOSIS — I10 Essential (primary) hypertension: Secondary | ICD-10-CM | POA: Diagnosis not present

## 2020-07-20 DIAGNOSIS — J449 Chronic obstructive pulmonary disease, unspecified: Secondary | ICD-10-CM | POA: Diagnosis not present

## 2020-07-20 DIAGNOSIS — I5032 Chronic diastolic (congestive) heart failure: Secondary | ICD-10-CM | POA: Diagnosis not present

## 2020-07-20 DIAGNOSIS — E7849 Other hyperlipidemia: Secondary | ICD-10-CM | POA: Diagnosis not present

## 2020-08-08 DIAGNOSIS — Z20828 Contact with and (suspected) exposure to other viral communicable diseases: Secondary | ICD-10-CM | POA: Diagnosis not present

## 2020-08-08 DIAGNOSIS — U071 COVID-19: Secondary | ICD-10-CM | POA: Diagnosis not present

## 2020-08-10 DIAGNOSIS — U071 COVID-19: Secondary | ICD-10-CM | POA: Diagnosis not present

## 2020-08-10 DIAGNOSIS — Z20828 Contact with and (suspected) exposure to other viral communicable diseases: Secondary | ICD-10-CM | POA: Diagnosis not present

## 2020-08-15 DIAGNOSIS — F330 Major depressive disorder, recurrent, mild: Secondary | ICD-10-CM | POA: Diagnosis not present

## 2020-08-15 DIAGNOSIS — I4811 Longstanding persistent atrial fibrillation: Secondary | ICD-10-CM | POA: Diagnosis not present

## 2020-08-15 DIAGNOSIS — I5022 Chronic systolic (congestive) heart failure: Secondary | ICD-10-CM | POA: Diagnosis not present

## 2020-08-15 DIAGNOSIS — E0843 Diabetes mellitus due to underlying condition with diabetic autonomic (poly)neuropathy: Secondary | ICD-10-CM | POA: Diagnosis not present

## 2020-08-15 DIAGNOSIS — E785 Hyperlipidemia, unspecified: Secondary | ICD-10-CM | POA: Diagnosis not present

## 2020-08-15 DIAGNOSIS — I10 Essential (primary) hypertension: Secondary | ICD-10-CM | POA: Diagnosis not present

## 2020-08-15 DIAGNOSIS — J449 Chronic obstructive pulmonary disease, unspecified: Secondary | ICD-10-CM | POA: Diagnosis not present

## 2020-08-15 DIAGNOSIS — U071 COVID-19: Secondary | ICD-10-CM | POA: Diagnosis not present

## 2020-08-17 DIAGNOSIS — Z20828 Contact with and (suspected) exposure to other viral communicable diseases: Secondary | ICD-10-CM | POA: Diagnosis not present

## 2020-08-17 DIAGNOSIS — U071 COVID-19: Secondary | ICD-10-CM | POA: Diagnosis not present

## 2020-08-19 ENCOUNTER — Encounter (HOSPITAL_COMMUNITY): Payer: Self-pay | Admitting: Emergency Medicine

## 2020-08-19 ENCOUNTER — Other Ambulatory Visit: Payer: Self-pay

## 2020-08-19 ENCOUNTER — Inpatient Hospital Stay (HOSPITAL_COMMUNITY)
Admission: EM | Admit: 2020-08-19 | Discharge: 2020-09-05 | DRG: 291 | Disposition: E | Payer: Medicare Other | Attending: Family Medicine | Admitting: Family Medicine

## 2020-08-19 ENCOUNTER — Emergency Department (HOSPITAL_COMMUNITY): Payer: Medicare Other

## 2020-08-19 DIAGNOSIS — Z91048 Other nonmedicinal substance allergy status: Secondary | ICD-10-CM

## 2020-08-19 DIAGNOSIS — I5043 Acute on chronic combined systolic (congestive) and diastolic (congestive) heart failure: Secondary | ICD-10-CM | POA: Diagnosis present

## 2020-08-19 DIAGNOSIS — Z981 Arthrodesis status: Secondary | ICD-10-CM

## 2020-08-19 DIAGNOSIS — I428 Other cardiomyopathies: Secondary | ICD-10-CM | POA: Diagnosis present

## 2020-08-19 DIAGNOSIS — L8990 Pressure ulcer of unspecified site, unspecified stage: Secondary | ICD-10-CM | POA: Insufficient documentation

## 2020-08-19 DIAGNOSIS — I472 Ventricular tachycardia: Secondary | ICD-10-CM | POA: Diagnosis not present

## 2020-08-19 DIAGNOSIS — N179 Acute kidney failure, unspecified: Secondary | ICD-10-CM | POA: Diagnosis present

## 2020-08-19 DIAGNOSIS — J9601 Acute respiratory failure with hypoxia: Secondary | ICD-10-CM

## 2020-08-19 DIAGNOSIS — R627 Adult failure to thrive: Secondary | ICD-10-CM | POA: Diagnosis present

## 2020-08-19 DIAGNOSIS — J449 Chronic obstructive pulmonary disease, unspecified: Secondary | ICD-10-CM | POA: Diagnosis present

## 2020-08-19 DIAGNOSIS — K3184 Gastroparesis: Secondary | ICD-10-CM | POA: Diagnosis present

## 2020-08-19 DIAGNOSIS — I2510 Atherosclerotic heart disease of native coronary artery without angina pectoris: Secondary | ICD-10-CM | POA: Diagnosis present

## 2020-08-19 DIAGNOSIS — L89322 Pressure ulcer of left buttock, stage 2: Secondary | ICD-10-CM | POA: Diagnosis present

## 2020-08-19 DIAGNOSIS — I110 Hypertensive heart disease with heart failure: Principal | ICD-10-CM | POA: Diagnosis present

## 2020-08-19 DIAGNOSIS — E872 Acidosis: Secondary | ICD-10-CM | POA: Diagnosis present

## 2020-08-19 DIAGNOSIS — J9620 Acute and chronic respiratory failure, unspecified whether with hypoxia or hypercapnia: Secondary | ICD-10-CM | POA: Diagnosis present

## 2020-08-19 DIAGNOSIS — J9622 Acute and chronic respiratory failure with hypercapnia: Secondary | ICD-10-CM

## 2020-08-19 DIAGNOSIS — E875 Hyperkalemia: Secondary | ICD-10-CM | POA: Diagnosis not present

## 2020-08-19 DIAGNOSIS — I10 Essential (primary) hypertension: Secondary | ICD-10-CM | POA: Diagnosis not present

## 2020-08-19 DIAGNOSIS — Z87891 Personal history of nicotine dependence: Secondary | ICD-10-CM

## 2020-08-19 DIAGNOSIS — Z8249 Family history of ischemic heart disease and other diseases of the circulatory system: Secondary | ICD-10-CM

## 2020-08-19 DIAGNOSIS — I482 Chronic atrial fibrillation, unspecified: Secondary | ICD-10-CM | POA: Diagnosis present

## 2020-08-19 DIAGNOSIS — R54 Age-related physical debility: Secondary | ICD-10-CM | POA: Diagnosis not present

## 2020-08-19 DIAGNOSIS — J4489 Other specified chronic obstructive pulmonary disease: Secondary | ICD-10-CM | POA: Diagnosis present

## 2020-08-19 DIAGNOSIS — E1143 Type 2 diabetes mellitus with diabetic autonomic (poly)neuropathy: Secondary | ICD-10-CM | POA: Diagnosis present

## 2020-08-19 DIAGNOSIS — J9811 Atelectasis: Secondary | ICD-10-CM | POA: Diagnosis present

## 2020-08-19 DIAGNOSIS — Z20822 Contact with and (suspected) exposure to covid-19: Secondary | ICD-10-CM | POA: Diagnosis present

## 2020-08-19 DIAGNOSIS — Z515 Encounter for palliative care: Secondary | ICD-10-CM

## 2020-08-19 DIAGNOSIS — R009 Unspecified abnormalities of heart beat: Secondary | ICD-10-CM

## 2020-08-19 DIAGNOSIS — G4733 Obstructive sleep apnea (adult) (pediatric): Secondary | ICD-10-CM | POA: Diagnosis present

## 2020-08-19 DIAGNOSIS — Z888 Allergy status to other drugs, medicaments and biological substances status: Secondary | ICD-10-CM

## 2020-08-19 DIAGNOSIS — Z833 Family history of diabetes mellitus: Secondary | ICD-10-CM

## 2020-08-19 DIAGNOSIS — Z6834 Body mass index (BMI) 34.0-34.9, adult: Secondary | ICD-10-CM

## 2020-08-19 DIAGNOSIS — I447 Left bundle-branch block, unspecified: Secondary | ICD-10-CM | POA: Diagnosis present

## 2020-08-19 DIAGNOSIS — R609 Edema, unspecified: Secondary | ICD-10-CM | POA: Diagnosis present

## 2020-08-19 DIAGNOSIS — K2100 Gastro-esophageal reflux disease with esophagitis, without bleeding: Secondary | ICD-10-CM | POA: Diagnosis present

## 2020-08-19 DIAGNOSIS — Z79899 Other long term (current) drug therapy: Secondary | ICD-10-CM

## 2020-08-19 DIAGNOSIS — M459 Ankylosing spondylitis of unspecified sites in spine: Secondary | ICD-10-CM | POA: Diagnosis present

## 2020-08-19 DIAGNOSIS — Z953 Presence of xenogenic heart valve: Secondary | ICD-10-CM | POA: Diagnosis not present

## 2020-08-19 DIAGNOSIS — G9340 Encephalopathy, unspecified: Secondary | ICD-10-CM | POA: Diagnosis not present

## 2020-08-19 DIAGNOSIS — E785 Hyperlipidemia, unspecified: Secondary | ICD-10-CM | POA: Diagnosis present

## 2020-08-19 DIAGNOSIS — E118 Type 2 diabetes mellitus with unspecified complications: Secondary | ICD-10-CM | POA: Diagnosis not present

## 2020-08-19 DIAGNOSIS — Z66 Do not resuscitate: Secondary | ICD-10-CM | POA: Diagnosis not present

## 2020-08-19 DIAGNOSIS — R600 Localized edema: Secondary | ICD-10-CM

## 2020-08-19 DIAGNOSIS — Z7901 Long term (current) use of anticoagulants: Secondary | ICD-10-CM

## 2020-08-19 DIAGNOSIS — L89312 Pressure ulcer of right buttock, stage 2: Secondary | ICD-10-CM | POA: Diagnosis present

## 2020-08-19 DIAGNOSIS — J9621 Acute and chronic respiratory failure with hypoxia: Secondary | ICD-10-CM | POA: Diagnosis present

## 2020-08-19 DIAGNOSIS — Z7189 Other specified counseling: Secondary | ICD-10-CM | POA: Diagnosis not present

## 2020-08-19 DIAGNOSIS — R0902 Hypoxemia: Secondary | ICD-10-CM | POA: Diagnosis not present

## 2020-08-19 DIAGNOSIS — Z0189 Encounter for other specified special examinations: Secondary | ICD-10-CM

## 2020-08-19 DIAGNOSIS — Z96641 Presence of right artificial hip joint: Secondary | ICD-10-CM | POA: Diagnosis present

## 2020-08-19 DIAGNOSIS — Z794 Long term (current) use of insulin: Secondary | ICD-10-CM

## 2020-08-19 LAB — COMPREHENSIVE METABOLIC PANEL
ALT: 13 U/L (ref 0–44)
AST: 17 U/L (ref 15–41)
Albumin: 3.5 g/dL (ref 3.5–5.0)
Alkaline Phosphatase: 83 U/L (ref 38–126)
Anion gap: 8 (ref 5–15)
BUN: 18 mg/dL (ref 8–23)
CO2: 35 mmol/L — ABNORMAL HIGH (ref 22–32)
Calcium: 8.7 mg/dL — ABNORMAL LOW (ref 8.9–10.3)
Chloride: 97 mmol/L — ABNORMAL LOW (ref 98–111)
Creatinine, Ser: 0.8 mg/dL (ref 0.61–1.24)
GFR, Estimated: >60 mL/min (ref >60)
Glucose, Bld: 111 mg/dL — ABNORMAL HIGH (ref 70–99)
Potassium: 4.2 mmol/L (ref 3.5–5.1)
Sodium: 140 mmol/L (ref 135–145)
Total Bilirubin: 1.1 mg/dL (ref 0.3–1.2)
Total Protein: 6.7 g/dL (ref 6.5–8.1)

## 2020-08-19 LAB — CBC
HCT: 44.7 % (ref 39.0–52.0)
Hemoglobin: 14 g/dL (ref 13.0–17.0)
MCH: 29.7 pg (ref 26.0–34.0)
MCHC: 31.3 g/dL (ref 30.0–36.0)
MCV: 94.7 fL (ref 80.0–100.0)
Platelets: 113 10*3/uL — ABNORMAL LOW (ref 150–400)
RBC: 4.72 MIL/uL (ref 4.22–5.81)
RDW: 16.3 % — ABNORMAL HIGH (ref 11.5–15.5)
WBC: 3.4 10*3/uL — ABNORMAL LOW (ref 4.0–10.5)
nRBC: 0 % (ref 0.0–0.2)

## 2020-08-19 LAB — LACTIC ACID, PLASMA
Lactic Acid, Venous: 1.3 mmol/L (ref 0.5–1.9)
Lactic Acid, Venous: 0.8 mmol/L (ref 0.5–1.9)

## 2020-08-19 LAB — PROTIME-INR
INR: 1.5 — ABNORMAL HIGH (ref 0.8–1.2)
Prothrombin Time: 17.9 seconds — ABNORMAL HIGH (ref 11.4–15.2)

## 2020-08-19 LAB — SARS CORONAVIRUS 2 (TAT 6-24 HRS): SARS Coronavirus 2: NEGATIVE

## 2020-08-19 LAB — CBG MONITORING, ED: Glucose-Capillary: 109 mg/dL — ABNORMAL HIGH (ref 70–99)

## 2020-08-19 LAB — BRAIN NATRIURETIC PEPTIDE: B Natriuretic Peptide: 386 pg/mL — ABNORMAL HIGH (ref 0.0–100.0)

## 2020-08-19 LAB — MAGNESIUM: Magnesium: 2.1 mg/dL (ref 1.7–2.4)

## 2020-08-19 MED ORDER — FUROSEMIDE 10 MG/ML IJ SOLN
40.0000 mg | Freq: Two times a day (BID) | INTRAMUSCULAR | Status: DC
  Administered 2020-08-20: 40 mg via INTRAVENOUS
  Filled 2020-08-19: qty 4

## 2020-08-19 MED ORDER — INSULIN ASPART 100 UNIT/ML ~~LOC~~ SOLN
5.0000 [IU] | Freq: Three times a day (TID) | SUBCUTANEOUS | Status: DC
  Administered 2020-08-20 – 2020-08-21 (×3): 5 [IU] via SUBCUTANEOUS
  Filled 2020-08-19: qty 1

## 2020-08-19 MED ORDER — ROSUVASTATIN CALCIUM 5 MG PO TABS
5.0000 mg | ORAL_TABLET | Freq: Every day | ORAL | Status: DC
  Administered 2020-08-21 – 2020-08-24 (×3): 5 mg via ORAL
  Filled 2020-08-19 (×3): qty 1

## 2020-08-19 MED ORDER — FUROSEMIDE 10 MG/ML IJ SOLN
40.0000 mg | Freq: Once | INTRAMUSCULAR | Status: AC
  Administered 2020-08-19: 40 mg via INTRAVENOUS
  Filled 2020-08-19: qty 4

## 2020-08-19 MED ORDER — IOHEXOL 350 MG/ML SOLN
100.0000 mL | Freq: Once | INTRAVENOUS | Status: AC | PRN
  Administered 2020-08-19: 100 mL via INTRAVENOUS

## 2020-08-19 MED ORDER — ONDANSETRON HCL 4 MG PO TABS: 4.0000 mg | ORAL_TABLET | Freq: Four times a day (QID) | ORAL | Status: DC | PRN

## 2020-08-19 MED ORDER — MAGNESIUM OXIDE 400 (241.3 MG) MG PO TABS
400.0000 mg | ORAL_TABLET | Freq: Two times a day (BID) | ORAL | Status: DC
  Administered 2020-08-19 – 2020-08-22 (×6): 400 mg via ORAL
  Filled 2020-08-19 (×10): qty 1

## 2020-08-19 MED ORDER — TAMSULOSIN HCL 0.4 MG PO CAPS
0.4000 mg | ORAL_CAPSULE | Freq: Every day | ORAL | Status: DC
  Administered 2020-08-20 – 2020-08-24 (×4): 0.4 mg via ORAL
  Filled 2020-08-19 (×4): qty 1

## 2020-08-19 MED ORDER — IPRATROPIUM-ALBUTEROL 0.5-2.5 (3) MG/3ML IN SOLN
3.0000 mL | Freq: Four times a day (QID) | RESPIRATORY_TRACT | Status: DC | PRN
  Administered 2020-08-23: 3 mL via RESPIRATORY_TRACT
  Filled 2020-08-19: qty 3

## 2020-08-19 MED ORDER — RIVAROXABAN 20 MG PO TABS: 20.0000 mg | ORAL_TABLET | Freq: Every day | ORAL | Status: DC

## 2020-08-19 MED ORDER — ONDANSETRON HCL 4 MG/2ML IJ SOLN: 4.0000 mg | Freq: Four times a day (QID) | INTRAMUSCULAR | Status: DC | PRN

## 2020-08-19 MED ORDER — POTASSIUM CHLORIDE CRYS ER 20 MEQ PO TBCR
20.0000 meq | EXTENDED_RELEASE_TABLET | Freq: Once | ORAL | Status: AC
  Administered 2020-08-19: 20 meq via ORAL
  Filled 2020-08-19: qty 1

## 2020-08-19 MED ORDER — INSULIN ASPART 100 UNIT/ML ~~LOC~~ SOLN: 0.0000 [IU] | Freq: Every day | SUBCUTANEOUS | Status: DC

## 2020-08-19 MED ORDER — CETIRIZINE HCL 10 MG PO TABS
5.0000 mg | ORAL_TABLET | Freq: Every evening | ORAL | Status: DC
  Filled 2020-08-19: qty 1

## 2020-08-19 MED ORDER — VILAZODONE HCL 40 MG PO TABS
40.0000 mg | ORAL_TABLET | Freq: Every day | ORAL | Status: DC
  Filled 2020-08-19 (×2): qty 1

## 2020-08-19 MED ORDER — POTASSIUM CHLORIDE CRYS ER 20 MEQ PO TBCR
40.0000 meq | EXTENDED_RELEASE_TABLET | Freq: Two times a day (BID) | ORAL | Status: DC
  Administered 2020-08-19: 40 meq via ORAL
  Filled 2020-08-19: qty 2

## 2020-08-19 MED ORDER — PANTOPRAZOLE SODIUM 40 MG PO TBEC
40.0000 mg | DELAYED_RELEASE_TABLET | Freq: Every day | ORAL | Status: DC
  Administered 2020-08-20 – 2020-08-24 (×4): 40 mg via ORAL
  Filled 2020-08-19 (×4): qty 1

## 2020-08-19 MED ORDER — INSULIN DEGLUDEC 100 UNIT/ML ~~LOC~~ SOLN: 40.0000 [IU] | Freq: Every day | SUBCUTANEOUS | Status: DC

## 2020-08-19 MED ORDER — INSULIN ASPART 100 UNIT/ML ~~LOC~~ SOLN
0.0000 [IU] | Freq: Three times a day (TID) | SUBCUTANEOUS | Status: DC
  Administered 2020-08-20: 3 [IU] via SUBCUTANEOUS
  Administered 2020-08-22 – 2020-08-24 (×5): 2 [IU] via SUBCUTANEOUS

## 2020-08-19 MED ORDER — LORATADINE 10 MG PO TABS
10.0000 mg | ORAL_TABLET | Freq: Every day | ORAL | Status: DC
  Administered 2020-08-19 – 2020-08-24 (×5): 10 mg via ORAL
  Filled 2020-08-19 (×5): qty 1

## 2020-08-19 MED ORDER — METOCLOPRAMIDE HCL 10 MG PO TABS
5.0000 mg | ORAL_TABLET | Freq: Three times a day (TID) | ORAL | Status: DC
  Administered 2020-08-20 – 2020-08-24 (×9): 5 mg via ORAL
  Filled 2020-08-19 (×10): qty 1

## 2020-08-19 MED ORDER — APIXABAN 5 MG PO TABS
5.0000 mg | ORAL_TABLET | Freq: Two times a day (BID) | ORAL | Status: DC
  Administered 2020-08-19 – 2020-08-22 (×6): 5 mg via ORAL
  Filled 2020-08-19 (×5): qty 1

## 2020-08-19 MED ORDER — ALBUTEROL SULFATE (2.5 MG/3ML) 0.083% IN NEBU: 2.5000 mg | INHALATION_SOLUTION | Freq: Four times a day (QID) | RESPIRATORY_TRACT | Status: DC | PRN

## 2020-08-19 NOTE — ED Triage Notes (Addendum)
Pt arrived by [facility] from [facility] for bilateral leg swelling for approx 1 week. Pt oxygen on arrival is 84% on RA. Pt placed on 3L [place] oxygen 93 %. Denies difficulty breathing.
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
## 2020-08-20 ENCOUNTER — Encounter (HOSPITAL_COMMUNITY): Payer: Self-pay | Admitting: Family Medicine

## 2020-08-20 ENCOUNTER — Other Ambulatory Visit: Payer: Self-pay

## 2020-08-20 ENCOUNTER — Inpatient Hospital Stay (HOSPITAL_COMMUNITY): Payer: Medicare Other

## 2020-08-20 DIAGNOSIS — I5043 Acute on chronic combined systolic (congestive) and diastolic (congestive) heart failure: Secondary | ICD-10-CM

## 2020-08-20 LAB — BASIC METABOLIC PANEL
Anion gap: 7 (ref 5–15)
BUN: 15 mg/dL (ref 8–23)
CO2: 36 mmol/L — ABNORMAL HIGH (ref 22–32)
Calcium: 9.1 mg/dL (ref 8.9–10.3)
Chloride: 100 mmol/L (ref 98–111)
Creatinine, Ser: 0.77 mg/dL (ref 0.61–1.24)
GFR, Estimated: >60 mL/min (ref >60)
Glucose, Bld: 107 mg/dL — ABNORMAL HIGH (ref 70–99)
Potassium: 5.2 mmol/L — ABNORMAL HIGH (ref 3.5–5.1)
Sodium: 143 mmol/L (ref 135–145)

## 2020-08-20 LAB — HEMOGLOBIN A1C
Hgb A1c MFr Bld: 6.6 % — ABNORMAL HIGH (ref 4.8–5.6)
Mean Plasma Glucose: 142.72 mg/dL

## 2020-08-20 LAB — ECHOCARDIOGRAM COMPLETE
Area-P 1/2: 1.65 cm2
Height: 72 in
MV VTI: 1.2 cm2
S' Lateral: 4.74 cm
Weight: 3841.3 oz

## 2020-08-20 LAB — GLUCOSE, CAPILLARY
Glucose-Capillary: 81 mg/dL (ref 70–99)
Glucose-Capillary: 179 mg/dL — ABNORMAL HIGH (ref 70–99)
Glucose-Capillary: 113 mg/dL — ABNORMAL HIGH (ref 70–99)

## 2020-08-20 LAB — CBG MONITORING, ED: Glucose-Capillary: 93 mg/dL (ref 70–99)

## 2020-08-20 MED ORDER — FUROSEMIDE 10 MG/ML IJ SOLN
80.0000 mg | Freq: Two times a day (BID) | INTRAMUSCULAR | Status: DC
  Administered 2020-08-20 – 2020-08-24 (×9): 80 mg via INTRAVENOUS
  Filled 2020-08-20 (×11): qty 8

## 2020-08-20 MED ORDER — VILAZODONE HCL 20 MG PO TABS
40.0000 mg | ORAL_TABLET | Freq: Every day | ORAL | Status: DC
  Administered 2020-08-20 – 2020-08-22 (×3): 40 mg via ORAL
  Filled 2020-08-20 (×6): qty 2

## 2020-08-20 MED ORDER — INSULIN GLARGINE 100 UNIT/ML ~~LOC~~ SOLN
40.0000 [IU] | Freq: Every day | SUBCUTANEOUS | Status: DC
  Administered 2020-08-22: 40 [IU] via SUBCUTANEOUS
  Filled 2020-08-20 (×4): qty 0.4

## 2020-08-20 MED ORDER — PERFLUTREN LIPID MICROSPHERE
1.0000 mL | INTRAVENOUS | Status: AC | PRN
  Administered 2020-08-20: 2 mL via INTRAVENOUS
  Filled 2020-08-20: qty 10

## 2020-08-20 NOTE — Progress Notes (Signed)
PROGRESS NOTE    Cody Hale  OIT:[id number] DOB: 27-Apr-1951 DOA: 08/29/2020 PCP: Shayne Alken, MD   Brief Narrative:  Per HPI: Cody Hale is a 70 y.o. male with a history of atrial fibrillation chronic anticoagulation, chronic systolic and diastolic heart failure, COPD, type 2 diabetes, hypertension.  [facility]ident of [facility].  Patient was called to the facility due to hypoxia and difficulty breathing.  Initially on EMS arrival he was found to have an oxygen saturation in the 80s.  He was placed on 2 L nasal cannula.  He did have some leg swellings for a couple weeks.  His shortness of breath is worse with lying down flat and exertion.  Improved with rest and sitting up in bed.  No other palliating or provoking factors.  Emergency Department Course: Initial oxygen saturation in the 80s.  Improved with nasal cannula 3 L.  Patient given IV Lasix.  Checks x-ray shows atelectasis and effusions.  CTA showed was negative for PE but showed pulmonary edema.   Assessment & Plan:   Active Problems:   OSA (obstructive sleep apnea)   Type II diabetes mellitus with manifestations (HCC)   COPD (chronic obstructive pulmonary disease) with chronic bronchitis (HCC)   GERD with esophagitis   Acute on chronic respiratory failure with hypoxia (HCC)   Acute on chronic combined systolic and diastolic CHF (congestive heart failure) (HCC)   Chronic atrial fibrillation (HCC)   Acute on chronic respiratory failure (HCC)   Acute hypoxemic respiratory failure secondary to acute on chronic combined systolic and diastolic heart failure exacerbation -Continue on Lasix IV twice daily increased to 80 mg -Continue to monitor daily weights and strict I's and O's.  Current weight 240 pounds -Hold potassium supplementation given hyperkalemia -2D echocardiogram performed with results pending -Repeat BMP in a.m.  Hyperkalemia -Hold further potassium supplementation and monitor IV  diuresis  Atrial fibrillation-rate controlled -Patient on Eliquis for anticoagulation -Continue telemetry monitoring  COPD -No signs of wheezing noted -DuoNebs as needed  Type 2 diabetes -Currently without significant hyperglycemia -Continue SSI -Continue long-acting insulin  OSA -CPAP at night   DVT prophylaxis: Eliquis Code Status: Full Family Communication: Tried calling daughter 2/13 with no response Disposition Plan:  Status is: Inpatient  Remains inpatient appropriate because:IV treatments appropriate due to intensity of illness or inability to take PO and Inpatient level of care appropriate due to severity of illness   Dispo: The patient is from: ALF              Anticipated d/c is to: ALF              Anticipated d/c date is: 3 days              Patient currently is not medically stable to d/c.   Difficult to place patient No  Consultants:   None  Procedures:   See below  Antimicrobials:   None   Subjective: Patient seen and evaluated today with no new acute complaints or concerns. No acute concerns or events noted overnight.  He is currently resting comfortably on his CPAP mask.  Objective: Vitals:   08/20/20 0600 08/20/20 0700 08/20/20 0730 08/20/20 0800  BP: 136/77 139/64 122/69 (!) 150/87  Pulse: 93 75 (!) 46 (!) 50  Resp: 17 20 18 20   Temp:      TempSrc:      SpO2: 94% 91% 93% 94%  Weight:      Height:  Intake/Output Summary (Last 24 hours) at 08/20/2020 1058 Last data filed at 2020-09-15 1421 Gross per 24 hour  Intake -  Output 400 ml  Net -400 ml   Filed Weights   September 15, 2020 0746 09/15/2020 0843  Weight: 107.5 kg 108.9 kg    Examination:  General exam: Appears calm and comfortable, currently on CPAP mask Respiratory system: Diminished bilaterally.  No wheezing. Cardiovascular system: S1 & S2 heard, RRR.  Gastrointestinal system: Abdomen is soft Central nervous system: Alert and awake Extremities: 2+ pitting edema noted  bilaterally. Skin: No significant lesions noted Psychiatry: Flat affect.    Data Reviewed: I have personally reviewed following labs and imaging studies  CBC: Recent Labs  Lab 2020-09-15 0811  WBC 3.4*  HGB 14.0  HCT 44.7  MCV 94.7  PLT 113*   Basic Metabolic Panel: Recent Labs  Lab September 15, 2020 0811 08/20/20 0501  NA 140 143  K 4.2 5.2*  CL 97* 100  CO2 35* 36*  GLUCOSE 111* 107*  BUN 18 15  CREATININE 0.80 0.77  CALCIUM 8.7* 9.1  MG 2.1  --    GFR: Estimated Creatinine Clearance: 111.1 mL/min (by C-G formula based on SCr of 0.77 mg/dL). Liver Function Tests: Recent Labs  Lab 09/15/2020 0811  AST 17  ALT 13  ALKPHOS 83  BILITOT 1.1  PROT 6.7  ALBUMIN 3.5   No results for input(s): LIPASE, AMYLASE in the last 168 hours. No results for input(s): AMMONIA in the last 168 hours. Coagulation Profile: Recent Labs  Lab Sep 15, 2020 0811  INR 1.5*   Cardiac Enzymes: No results for input(s): CKTOTAL, CKMB, CKMBINDEX, TROPONINI in the last 168 hours. BNP (last 3 results) No results for input(s): PROBNP in the last 8760 hours. HbA1C: No results for input(s): HGBA1C in the last 72 hours. CBG: Recent Labs  Lab 09-15-2020 2125 08/20/20 0835  GLUCAP 109* 93   Lipid Profile: No results for input(s): CHOL, HDL, LDLCALC, TRIG, CHOLHDL, LDLDIRECT in the last 72 hours. Thyroid Function Tests: No results for input(s): TSH, T4TOTAL, FREET4, T3FREE, THYROIDAB in the last 72 hours. Anemia Panel: No results for input(s): VITAMINB12, FOLATE, FERRITIN, TIBC, IRON, RETICCTPCT in the last 72 hours. Sepsis Labs: Recent Labs  Lab September 15, 2020 0813 09/15/20 1011  LATICACIDVEN 1.3 0.8    Recent Results (from the past 240 hour(s))  Culture, blood (routine x 2)     Status: None (Preliminary result)   Collection Time: 2020/09/15  8:18 AM   Specimen: BLOOD RIGHT FOREARM  Result Value Ref Range Status   Specimen Description BLOOD RIGHT FOREARM  Final   Special Requests   Final     BOTTLES DRAWN AEROBIC AND ANAEROBIC Blood Culture adequate volume   Culture   Final    NO GROWTH < 12 HOURS Performed at [facility], [address], [place], [place] [address]    Report Status PENDING  Incomplete  Culture, blood (routine x 2)     Status: None (Preliminary result)   Collection Time: 09/15/20  8:40 AM   Specimen: BLOOD RIGHT ARM  Result Value Ref Range Status   Specimen Description BLOOD RIGHT ARM  Final   Special Requests   Final    BOTTLES DRAWN AEROBIC AND ANAEROBIC Blood Culture adequate volume   Culture   Final    NO GROWTH < 12 HOURS Performed at [facility], [address], [place], [place] [address]    Report Status PENDING  Incomplete  SARS CORONAVIRUS 2 (TAT 6-24 HRS) Nasopharyngeal Nasopharyngeal Swab  Status: None   Collection Time: Aug 24, 2020  8:50 AM   Specimen: Nasopharyngeal Swab  Result Value Ref Range Status   SARS Coronavirus 2 NEGATIVE NEGATIVE Final    Comment: (NOTE) SARS-CoV-2 target nucleic acids are NOT DETECTED.  The SARS-CoV-2 RNA is generally detectable in upper and lower respiratory specimens during the acute phase of infection. Negative results do not preclude SARS-CoV-2 infection, do not rule out co-infections with other pathogens, and should not be used as the sole basis for treatment or other patient management decisions. Negative results must be combined with clinical observations, patient history, and epidemiological information. The expected result is Negative.  Fact Sheet for Patients: HairSlick.no  Fact Sheet for Healthcare Providers: quierodirigir.com  This test is not yet approved or cleared by the [place] FDA and  has been authorized for detection and/or diagnosis of SARS-CoV-2 by FDA under an Emergency Use Authorization (EUA). This EUA will remain  in effect (meaning this test can be used) for the duration of the COVID-19 declaration under Se ction  564(b)(1) of the Act, 21 U.S.C. section 360bbb-3(b)(1), unless the authorization is terminated or revoked sooner.  Performed at [facility], [address] [place], [place] [address]          Radiology Studies: CT Angio Chest PE W/Cm &/Or Wo Cm  Result Date: 24-Aug-2020 CLINICAL DATA:  Bilateral leg swelling for approximately 1 week. Decreased oxygen saturation on room air. History of hypertension, diabetes, COPD and CHF. EXAM: CT ANGIOGRAPHY CHEST WITH CONTRAST TECHNIQUE: Multidetector CT imaging of the chest was performed using the standard protocol during bolus administration of intravenous contrast. Multiplanar CT image reconstructions and MIPs were obtained to evaluate the vascular anatomy. CONTRAST:  OMNIPAQUE IOHEXOL 350 MG/ML SOLN COMPARISON:  Chest CT, 03/21/2020.  Current chest radiograph. FINDINGS: Cardiovascular: Pulmonary arteries are well opacified. No evidence of a pulmonary embolism. Stable changes from previous cardiac surgery aortic valve replacement. Three-vessel coronary artery calcifications. No pericardial effusion. Heart normal in size. Mild dilation of the main pulmonary artery, 3.6 cm. Aorta normal in caliber. Mild aortic atherosclerosis. No dissection. Mediastinum/Nodes: No neck base or axillary masses or enlarged lymph nodes. There scattered prominent mediastinal lymph nodes, precarinal node measuring 11 mm in short axis, subcarinal node measuring 11 mm in short axis. Nodes are stable from the prior CT. Trachea and esophagus are unremarkable. Lungs/Pleura: Moderate, right greater than left, pleural effusions. Left lower lobe is completely collapsed with atelectasis. There is significant atelectasis the right lower lobe. Additional atelectasis is noted in the dependent upper lobes, left greater than right. Aerated portions of the lungs show interstitial thickening. No mass or suspicious nodule. No pneumothorax. Upper Abdomen: No acute abnormality.  Musculoskeletal: No acute fractures. No osteoblastic or osteolytic lesions. Old rib fractures. Vertebroplasty cement T11 and T12 vertebra. Previous posterior fusion from T11 into the lumbar spine below the included field of view. Review of the MIP images confirms the above findings. IMPRESSION: 1. No evidence of a pulmonary embolism. 2. Moderate bilateral pleural effusions, greater on the right. Significant associated dependent atelectasis similar to the prior CT. Aerated lungs show interstitial thickening consistent with interstitial pulmonary edema. No convincing pneumonia. 3. Stable changes from prior cardiac surgery. Three-vessel coronary artery calcifications. 4. Mildly enlarged main pulmonary artery suggesting a component pulmonary hypertension. This is stable. 5. Aortic atherosclerosis. Aortic Atherosclerosis (ICD10-I70.0). Electronically Signed   By: Amie Portland M.D.   On: 2020/08/24 16:02   DG Chest Portable 1 View  Result Date:  02-Sep-2020 CLINICAL DATA:  Hypoxia EXAM: PORTABLE CHEST 1 VIEW COMPARISON:  April 26, 2020 FINDINGS: There is persistent airspace opacity/consolidation in the left lower lobe with small left pleural effusion. Right lung is clear. There is cardiomegaly with pulmonary vascularity within normal limits. Status post median sternotomy. No adenopathy. No bone lesions. IMPRESSION: Persistent left lower lobe consolidation with small left pleural effusion. Right lung clear. Stable cardiomegaly. Electronically Signed   By: Bretta Bang III M.D.   On: September 02, 2020 08:34        Scheduled Meds: . apixaban  5 mg Oral BID  . furosemide  80 mg Intravenous BID  . insulin aspart  0-15 Units Subcutaneous TID WC  . insulin aspart  0-5 Units Subcutaneous QHS  . insulin aspart  5 Units Subcutaneous TID with meals  . insulin glargine  40 Units Subcutaneous Daily  . loratadine  10 mg Oral Daily  . magnesium oxide  400 mg Oral BID  . metoCLOPramide  5 mg Oral TID AC  .  pantoprazole  40 mg Oral Daily  . rosuvastatin  5 mg Oral Daily  . tamsulosin  0.4 mg Oral QPC breakfast  . Vilazodone HCl  40 mg Oral Daily    LOS: 1 day    Time spent: 35 minutes    Mariana Goytia Hoover Brunette, DO [facility]  If 7PM-7AM, please contact night-coverage [email or web address] 08/20/2020, 10:58 AM

## 2020-08-20 NOTE — Progress Notes (Signed)
  Echocardiogram 2D Echocardiogram has been performed.  Pieter Partridge 08/20/2020, 8:28 AM

## 2020-08-21 ENCOUNTER — Inpatient Hospital Stay (HOSPITAL_COMMUNITY): Payer: Medicare Other

## 2020-08-21 DIAGNOSIS — I482 Chronic atrial fibrillation, unspecified: Secondary | ICD-10-CM | POA: Diagnosis not present

## 2020-08-21 DIAGNOSIS — I472 Ventricular tachycardia: Secondary | ICD-10-CM

## 2020-08-21 DIAGNOSIS — I5043 Acute on chronic combined systolic (congestive) and diastolic (congestive) heart failure: Secondary | ICD-10-CM | POA: Diagnosis not present

## 2020-08-21 DIAGNOSIS — I10 Essential (primary) hypertension: Secondary | ICD-10-CM | POA: Diagnosis not present

## 2020-08-21 LAB — TROPONIN I (HIGH SENSITIVITY)
Troponin I (High Sensitivity): 44 ng/L — ABNORMAL HIGH (ref <18)
Troponin I (High Sensitivity): 39 ng/L — ABNORMAL HIGH (ref <18)

## 2020-08-21 LAB — GLUCOSE, CAPILLARY
Glucose-Capillary: 107 mg/dL — ABNORMAL HIGH (ref 70–99)
Glucose-Capillary: 93 mg/dL (ref 70–99)
Glucose-Capillary: 97 mg/dL (ref 70–99)
Glucose-Capillary: 125 mg/dL — ABNORMAL HIGH (ref 70–99)
Glucose-Capillary: 127 mg/dL — ABNORMAL HIGH (ref 70–99)

## 2020-08-21 LAB — CBC
HCT: 45.9 % (ref 39.0–52.0)
Hemoglobin: 13.8 g/dL (ref 13.0–17.0)
MCH: 29.4 pg (ref 26.0–34.0)
MCHC: 30.1 g/dL (ref 30.0–36.0)
MCV: 97.7 fL (ref 80.0–100.0)
Platelets: 127 10*3/uL — ABNORMAL LOW (ref 150–400)
RBC: 4.7 MIL/uL (ref 4.22–5.81)
RDW: 16 % — ABNORMAL HIGH (ref 11.5–15.5)
WBC: 5.1 10*3/uL (ref 4.0–10.5)
nRBC: 0 % (ref 0.0–0.2)

## 2020-08-21 LAB — BASIC METABOLIC PANEL
Anion gap: 12 (ref 5–15)
BUN: 21 mg/dL (ref 8–23)
CO2: 33 mmol/L — ABNORMAL HIGH (ref 22–32)
Calcium: 9.1 mg/dL (ref 8.9–10.3)
Chloride: 97 mmol/L — ABNORMAL LOW (ref 98–111)
Creatinine, Ser: 1.07 mg/dL (ref 0.61–1.24)
GFR, Estimated: >60 mL/min (ref >60)
Glucose, Bld: 93 mg/dL (ref 70–99)
Potassium: 5.1 mmol/L (ref 3.5–5.1)
Sodium: 142 mmol/L (ref 135–145)

## 2020-08-21 LAB — MAGNESIUM: Magnesium: 2.2 mg/dL (ref 1.7–2.4)

## 2020-08-21 MED ORDER — METOLAZONE 5 MG PO TABS
2.5000 mg | ORAL_TABLET | Freq: Once | ORAL | Status: AC
  Administered 2020-08-21: 2.5 mg via ORAL
  Filled 2020-08-21: qty 1

## 2020-08-21 MED ORDER — ACETAMINOPHEN 325 MG PO TABS
650.0000 mg | ORAL_TABLET | Freq: Four times a day (QID) | ORAL | Status: DC | PRN
  Administered 2020-08-21 (×2): 650 mg via ORAL
  Filled 2020-08-21 (×2): qty 2

## 2020-08-21 NOTE — Consult Note (Addendum)
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 reduced. There is severely elevated pulmonary artery systolic pressure. The tricuspid regurgitant velocity is 3.66 m/s, and with an assumed right atrial pressure of 15 mmHg, the estimated right ventricular systolic pressure is 19.5 mmHg. Left Atrium: Left atrial size was severely dilated. Right Atrium: Right atrial size was normal in size. Pericardium: There is no evidence of pericardial effusion. Mitral Valve: PHT > 130 (160 ms). The mitral valve has been repaired/replaced. No evidence of mitral valve regurgitation. 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 Moderate  mitral valve stenosis. MV peak gradient, 25.8  mmHg. The mean mitral valve gradient is 7.5 mmHg. Tricuspid Valve: The tricuspid valve is normal in structure. Tricuspid valve regurgitation is trivial. Aortic Valve: The aortic valve is tricuspid. There is mild calcification of the aortic valve. There is moderate thickening of the aortic valve. Aortic valve regurgitation is not visualized. No aortic stenosis is present. Pulmonic Valve: The pulmonic valve was not well visualized. Pulmonic valve regurgitation is not visualized. No evidence of pulmonic stenosis. Aorta: The aortic root is normal in size and structure and the ascending aorta was not well visualized. Venous: The inferior vena cava is dilated in size with less than 50% respiratory variability, suggesting right atrial pressure of 15 mmHg. IAS/Shunts: The atrial septum is grossly normal. Additional Comments: There is a large pleural effusion.  LEFT VENTRICLE PLAX 2D LVIDd:         5.27 cm  Diastology LVIDs:         4.74 cm  LV e' medial:    3.81 cm/s LV PW:         1.26 cm  LV E/e' medial:  48.0 LV IVS:        1.24 cm  LV e' lateral:   5.55 cm/s LVOT diam:     2.50 cm  LV E/e' lateral: 33.0 LV SV:         91 LV SV Index:   40 LVOT Area:     4.91 cm  RIGHT VENTRICLE RV Basal diam:  4.52 cm RV S prime:     5.44 cm/s TAPSE (M-mode): 2.0 cm LEFT ATRIUM  Index       RIGHT ATRIUM           Index LA diam:        4.50 cm  1.95 cm/m  RA Area:     17.90 cm LA Vol (A2C):   129.0 ml 56.02 ml/m RA Volume:   44.90 ml  19.50 ml/m LA Vol (A4C):   145.0 ml 62.97 ml/m LA Biplane Vol: 138.0 ml 59.93 ml/m  AORTIC VALVE LVOT Vmax:   98.90 cm/s LVOT Vmean:  57.400 cm/s LVOT VTI:    0.186 m  AORTA Ao Root diam: 2.80 cm MITRAL VALVE                TRICUSPID VALVE MV Area (PHT): 1.65 cm     TR Peak grad:   53.6 mmHg MV Area VTI:   1.20 cm     TR Vmax:        366.00 cm/s MV Peak grad:  25.8 mmHg MV Mean grad:  7.5 mmHg     SHUNTS MV Vmax:       2.54 m/s     Systemic  VTI:  0.19 m MV Vmean:      117.5 cm/s   Systemic Diam: 2.50 cm MV Decel Time: 459 msec MV E velocity: 183.00 cm/s Rudean Haskell MD Electronically signed by Rudean Haskell MD Signature Date/Time: 08/20/2020/12:23:20 PM    Final      Assessment and Plan:   Acute hypoxic respiratory failure secondary to acute on chronic systolic and diastolic CHF, pulmonary edema LVEF 25 to 30% on echo yesterday similar to prior echo 04/2020.  I/O- 670 overnight. Was on lasix 40 am 20 pm now on 80 mg IV BID. Needs aggressive diuresis. See how much he puts out today. Renal ok  Chronic atrial fibrillation on Eliquis  NSVT-has had for a long time-seen by Dr. Rayann Heman in 2020 but no ischemic work up pursued at that time. Patient asymptomatic. Previously on beta blocker but stopped due to bradycardia and AV block per notes.  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 The patient's score is based upon: CHF History: Yes HTN History: Yes Diabetes History: Yes Stroke History: No Vascular Disease History: Yes Age Score: 1 Gender Score: 0          For questions or updates, please contact [place] Please consult [email or web address] for contact info under    Signed, Ermalinda Barrios, PA-C  08/21/2020 8:01 AM  Patient examined chart reviewed Discussed care with patient and PA Exam with chronically ill male basilar crackles UE tremulous with some Rigidity no no MR murmur bilateral LE edema. Agree with diuresis Continue  Eliquis for anticoagulation rates are ok. Still appears to need lots of rehab  Jenkins Rouge MD [facility]

## 2020-08-21 NOTE — Progress Notes (Signed)
Pt complains of SOB, and continues readjustment in chair. Vitals stable MD made aware will continue to monitor.

## 2020-08-21 NOTE — Progress Notes (Signed)
80fr Foley inserted due to urinary retention, 200 ML urine return. Pt tolerated well. Peri care preformed before and after insertion.

## 2020-08-21 NOTE — Progress Notes (Signed)
Pt refuses to eat lunch or dinner, He states" I just dont feel like eating" he continues to say he doesn't feel too good" MD notified told to hold insulin and reglan.

## 2020-08-21 NOTE — Progress Notes (Signed)
PROGRESS NOTE    Zarif Rathje  ZOX:[id number] DOB: Oct 11, 1950 DOA: August 29, 2020 PCP: Shayne Alken, MD   Brief Narrative:  Per HPI: Cody Smithis a 70 y.o.malewith a history of atrial fibrillation chronic anticoagulation, chronic systolic and diastolic heart failure, COPD, type 2 diabetes, hypertension. [facility] of [facility]. Patient was called to the facility due to hypoxia and difficulty breathing. Initially on EMS arrival he was found to have an oxygen saturation in the 80s. He was placed on 2 L nasal cannula. He did have some leg swellings for a couple weeks. His shortness of breath is worse with lying down flat and exertion. Improved with rest and sitting up in bed. No other palliating or provoking factors.  Emergency Department Course: Initial oxygen saturation in the 80s. Improved with nasal cannula 3 L. Patient given IV Lasix. Checks x-ray shows atelectasis and effusions. CTA showed was negative for PE but showed pulmonary edema.  Assessment & Plan:   Active Problems:   OSA (obstructive sleep apnea)   Type II diabetes mellitus with manifestations (HCC)   COPD (chronic obstructive pulmonary disease) with chronic bronchitis (HCC)   GERD with esophagitis   Acute on chronic respiratory failure with hypoxia (HCC)   Acute on chronic combined systolic and diastolic CHF (congestive heart failure) (HCC)   Chronic atrial fibrillation (HCC)   Acute on chronic respiratory failure (HCC)   Acute hypoxemic respiratory failure secondary to acute on chronic combined systolic and diastolic heart failure exacerbation -Continue on Lasix IV twice daily increased to 80 mg -Continue to monitor daily weights and strict I's and O's.  Current weight 239 pounds -Hold potassium supplementation given hyperkalemia -2D echocardiogram performed with similar results as before with LVEF 25-30% -Repeat BMP in a.m. -Appreciate Cardiology input  Atrial  fibrillation-rate controlled -Patient on Eliquis for anticoagulation -Continue telemetry monitoring  COPD -No signs of wheezing noted -DuoNebs as needed  Type 2 diabetes -Currently without significant hyperglycemia -Continue SSI -Continue long-acting insulin  OSA -CPAP at night   DVT prophylaxis: Eliquis Code Status: Full Family Communication: Called daughter 2/14 Disposition Plan:  Status is: Inpatient  Remains inpatient appropriate because:IV treatments appropriate due to intensity of illness or inability to take PO and Inpatient level of care appropriate due to severity of illness   Dispo: The patient is from: ALF  Anticipated d/c is to: ALF  Anticipated d/c date is: 3 days  Patient currently is not medically stable to d/c.              Difficult to place patient No  Consultants:   None  Procedures:   See below  Antimicrobials:   None   Subjective: Patient seen and evaluated today with no new acute complaints or concerns. No acute concerns or events noted overnight.  Objective: Vitals:   08/20/20 2202 08/21/20 0500 08/21/20 0537 08/21/20 0925  BP: (!) 153/134  117/86 114/84  Pulse: 90  (!) 51 91  Resp: 18  16 14   Temp: 99.1 F (37.3 C)  98.8 F (37.1 C) 98 F (36.7 C)  TempSrc: Oral  Oral Oral  SpO2: 92%  93% 99%  Weight:  108.8 kg    Height:        Intake/Output Summary (Last 24 hours) at 08/21/2020 0952 Last data filed at 08/21/2020 0925 Gross per 24 hour  Intake 720 ml  Output 750 ml  Net -30 ml   Filed Weights   August 29, 2020 0746 29-Aug-2020 0843 08/21/20 0500  Weight: 107.5 kg  108.9 kg 108.8 kg    Examination:  General exam: Appears calm and comfortable  Respiratory system: Clear to auscultation. Respiratory effort normal. Currently 6L [place]. Cardiovascular system: S1 & S2 heard, RRR.  Gastrointestinal system: Abdomen is soft Central nervous system: Alert and awake Extremities: 2-3+ pitting  edema Skin: No significant lesions noted Psychiatry: Flat affect.    Data Reviewed: I have personally reviewed following labs and imaging studies  CBC: Recent Labs  Lab 08/28/2020 0811 08/21/20 0410  WBC 3.4* 5.1  HGB 14.0 13.8  HCT 44.7 45.9  MCV 94.7 97.7  PLT 113* 127*   Basic Metabolic Panel: Recent Labs  Lab 08/24/2020 0811 08/20/20 0501 08/21/20 0410  NA 140 143 142  K 4.2 5.2* 5.1  CL 97* 100 97*  CO2 35* 36* 33*  GLUCOSE 111* 107* 93  BUN 18 15 21   CREATININE 0.80 0.77 1.07  CALCIUM 8.7* 9.1 9.1  MG 2.1  --  2.2   GFR: Estimated Creatinine Clearance: 79.2 mL/min (by C-G formula based on SCr of 1.07 mg/dL). Liver Function Tests: Recent Labs  Lab 09/02/2020 0811  AST 17  ALT 13  ALKPHOS 83  BILITOT 1.1  PROT 6.7  ALBUMIN 3.5   No results for input(s): LIPASE, AMYLASE in the last 168 hours. No results for input(s): AMMONIA in the last 168 hours. Coagulation Profile: Recent Labs  Lab 08/13/2020 0811  INR 1.5*   Cardiac Enzymes: No results for input(s): CKTOTAL, CKMB, CKMBINDEX, TROPONINI in the last 168 hours. BNP (last 3 results) No results for input(s): PROBNP in the last 8760 hours. HbA1C: Recent Labs    08/12/2020 1841  HGBA1C 6.6*   CBG: Recent Labs  Lab 08/20/20 0835 08/20/20 1318 08/20/20 1645 08/20/20 2209 08/21/20 0725  GLUCAP 93 81 179* 113* 107*   Lipid Profile: No results for input(s): CHOL, HDL, LDLCALC, TRIG, CHOLHDL, LDLDIRECT in the last 72 hours. Thyroid Function Tests: No results for input(s): TSH, T4TOTAL, FREET4, T3FREE, THYROIDAB in the last 72 hours. Anemia Panel: No results for input(s): VITAMINB12, FOLATE, FERRITIN, TIBC, IRON, RETICCTPCT in the last 72 hours. Sepsis Labs: Recent Labs  Lab 08/10/2020 0813 08/09/2020 1011  LATICACIDVEN 1.3 0.8    Recent Results (from the past 240 hour(s))  Culture, blood (routine x 2)     Status: None (Preliminary result)   Collection Time: 08/28/2020  8:18 AM   Specimen: BLOOD  RIGHT FOREARM  Result Value Ref Range Status   Specimen Description BLOOD RIGHT FOREARM  Final   Special Requests   Final    BOTTLES DRAWN AEROBIC AND ANAEROBIC Blood Culture adequate volume   Culture   Final    NO GROWTH < 12 HOURS Performed at [facility], [address], [place], [place]    Report Status PENDING  Incomplete  Culture, blood (routine x 2)     Status: None (Preliminary result)   Collection Time: 08/14/2020  8:40 AM   Specimen: BLOOD RIGHT ARM  Result Value Ref Range Status   Specimen Description BLOOD RIGHT ARM  Final   Special Requests   Final    BOTTLES DRAWN AEROBIC AND ANAEROBIC Blood Culture adequate volume   Culture   Final    NO GROWTH < 12 HOURS Performed at [facility], [address], [place], [place]    Report Status PENDING  Incomplete  SARS CORONAVIRUS 2 (TAT 6-24 HRS) Nasopharyngeal Nasopharyngeal Swab     Status: None   Collection Time: 08/17/2020  8:50  AM   Specimen: Nasopharyngeal Swab  Result Value Ref Range Status   SARS Coronavirus 2 NEGATIVE NEGATIVE Final    Comment: (NOTE) SARS-CoV-2 target nucleic acids are NOT DETECTED.  The SARS-CoV-2 RNA is generally detectable in upper and lower respiratory specimens during the acute phase of infection. Negative results do not preclude SARS-CoV-2 infection, do not rule out co-infections with other pathogens, and should not be used as the sole basis for treatment or other patient management decisions. Negative results must be combined with clinical observations, patient history, and epidemiological information. The expected result is Negative.  Fact Sheet for Patients: HairSlick.no  Fact Sheet for Healthcare Providers: quierodirigir.com  This test is not yet approved or cleared by the [place] FDA and  has been authorized for detection and/or diagnosis of SARS-CoV-2 by FDA under an Emergency Use Authorization (EUA). This  EUA will remain  in effect (meaning this test can be used) for the duration of the COVID-19 declaration under Se ction 564(b)(1) of the Act, 21 U.S.C. section 360bbb-3(b)(1), unless the authorization is terminated or revoked sooner.  Performed at [facility], [address], [place], [place] [address]          Radiology Studies: CT Angio Chest PE W/Cm &/Or Wo Cm  Result Date: 08/24/2020 CLINICAL DATA:  Bilateral leg swelling for approximately 1 week. Decreased oxygen saturation on room air. History of hypertension, diabetes, COPD and CHF. EXAM: CT ANGIOGRAPHY CHEST WITH CONTRAST TECHNIQUE: Multidetector CT imaging of the chest was performed using the standard protocol during bolus administration of intravenous contrast. Multiplanar CT image reconstructions and MIPs were obtained to evaluate the vascular anatomy. CONTRAST:  OMNIPAQUE IOHEXOL 350 MG/ML SOLN COMPARISON:  Chest CT, 03/21/2020.  Current chest radiograph. FINDINGS: Cardiovascular: Pulmonary arteries are well opacified. No evidence of a pulmonary embolism. Stable changes from previous cardiac surgery aortic valve replacement. Three-vessel coronary artery calcifications. No pericardial effusion. Heart normal in size. Mild dilation of the main pulmonary artery, 3.6 cm. Aorta normal in caliber. Mild aortic atherosclerosis. No dissection. Mediastinum/Nodes: No neck base or axillary masses or enlarged lymph nodes. There scattered prominent mediastinal lymph nodes, precarinal node measuring 11 mm in short axis, subcarinal node measuring 11 mm in short axis. Nodes are stable from the prior CT. Trachea and esophagus are unremarkable. Lungs/Pleura: Moderate, right greater than left, pleural effusions. Left lower lobe is completely collapsed with atelectasis. There is significant atelectasis the right lower lobe. Additional atelectasis is noted in the dependent upper lobes, left greater than right. Aerated portions of the lungs show  interstitial thickening. No mass or suspicious nodule. No pneumothorax. Upper Abdomen: No acute abnormality. Musculoskeletal: No acute fractures. No osteoblastic or osteolytic lesions. Old rib fractures. Vertebroplasty cement T11 and T12 vertebra. Previous posterior fusion from T11 into the lumbar spine below the included field of view. Review of the MIP images confirms the above findings. IMPRESSION: 1. No evidence of a pulmonary embolism. 2. Moderate bilateral pleural effusions, greater on the right. Significant associated dependent atelectasis similar to the prior CT. Aerated lungs show interstitial thickening consistent with interstitial pulmonary edema. No convincing pneumonia. 3. Stable changes from prior cardiac surgery. Three-vessel coronary artery calcifications. 4. Mildly enlarged main pulmonary artery suggesting a component pulmonary hypertension. This is stable. 5. Aortic atherosclerosis. Aortic Atherosclerosis (ICD10-I70.0). Electronically Signed   By: Amie Portland M.D.   On: 08-24-2020 16:02   ECHOCARDIOGRAM COMPLETE  Result Date: 08/20/2020    ECHOCARDIOGRAM REPORT   Patient Name:  Sharilyn SitesARLIS Shorty Date of Exam: 08/20/2020 Medical Rec #:  [id number]    Height:       72.0 in Accession #:    [id number]   Weight:       240.1 lb Date of Birth:  28-Nov-1950    BSA:          2.303 m Patient Age:    69 years     BP:           150/87 mmHg Patient Gender: M            HR:           75 bpm. [facility] Procedure: 2D Echo, Cardiac Doppler, Color Doppler and Intracardiac            Opacification Agent Indications:    CHF  History:        Patient has prior history of Echocardiogram examinations, most                 recent 04/17/2020. CHF, CAD, Prior CABG, COPD, Mitral Valve                 Disease, Arrythmias:Atrial Fibrillation,                 Signs/Symptoms:Shortness of Breath; Risk Factors:Hypertension                 and Diabetes. Resp. failure.  Sonographer:    Lavenia AtlasBrooke Strickland RDCS Referring  Phys: [id number] JACOB J STINSON IMPRESSIONS  1. Left ventricular ejection fraction, by estimation, is 25 to 30%. The left ventricle has severely decreased function. The left ventricle demonstrates global hypokinesis. There is mild concentric left ventricular hypertrophy. Left ventricular diastolic  parameters are indeterminate.  2. Right ventricular systolic function is moderately reduced. The right ventricular size is mildly enlarged. There is severely elevated pulmonary artery systolic pressure. The estimated right ventricular systolic pressure is 68.6 mmHg.  3. Left atrial size was severely dilated.  4. The mitral valve has been repaired/replaced. No evidence of mitral valve regurgitation. Moderate mitral stenosis with normal flow suggestived by gradient assessment.  5. Large pleural effusion.  6. The aortic valve is tricuspid. There is mild calcification of the aortic valve. There is moderate thickening of the aortic valve. Aortic valve regurgitation is not visualized. No aortic stenosis is present.  7. The inferior vena cava is dilated in size with <50% respiratory variability, suggesting right atrial pressure of 15 mmHg. Comparison(s): A prior study was performed on 04/17/2020. No significant change from prior study. Prior images reviewed side by side. FINDINGS  Left Ventricle: Left ventricular ejection fraction, by estimation, is 25 to 30%. The left ventricle has severely decreased function. The left ventricle demonstrates global hypokinesis. Definity contrast agent was given IV to delineate the left ventricular endocardial borders. The left ventricular internal cavity size was normal in size. There is mild concentric left ventricular hypertrophy. Left ventricular diastolic parameters are indeterminate. Right Ventricle: The right ventricular size is mildly enlarged. No increase in right ventricular wall thickness. Right ventricular systolic function is moderately reduced. There is severely elevated pulmonary  artery systolic pressure. The tricuspid regurgitant velocity is 3.66 m/s, and with an assumed right atrial pressure of 15 mmHg, the estimated right ventricular systolic pressure is 68.6 mmHg. Left Atrium: Left atrial size was severely dilated. Right Atrium: Right atrial size was normal in size. Pericardium: There is no evidence of pericardial effusion. Mitral Valve: PHT > 130 (160 ms). The mitral  valve has been repaired/replaced. No evidence of mitral valve regurgitation. 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 Moderate mitral valve stenosis. MV peak gradient, 25.8  mmHg. The mean mitral valve gradient is 7.5 mmHg. Tricuspid Valve: The tricuspid valve is normal in structure. Tricuspid valve regurgitation is trivial. Aortic Valve: The aortic valve is tricuspid. There is mild calcification of the aortic valve. There is moderate thickening of the aortic valve. Aortic valve regurgitation is not visualized. No aortic stenosis is present. Pulmonic Valve: The pulmonic valve was not well visualized. Pulmonic valve regurgitation is not visualized. No evidence of pulmonic stenosis. Aorta: The aortic root is normal in size and structure and the ascending aorta was not well visualized. Venous: The inferior vena cava is dilated in size with less than 50% respiratory variability, suggesting right atrial pressure of 15 mmHg. IAS/Shunts: The atrial septum is grossly normal. Additional Comments: There is a large pleural effusion.  LEFT VENTRICLE PLAX 2D LVIDd:         5.27 cm  Diastology LVIDs:         4.74 cm  LV e' medial:    3.81 cm/s LV PW:         1.26 cm  LV E/e' medial:  48.0 LV IVS:        1.24 cm  LV e' lateral:   5.55 cm/s LVOT diam:     2.50 cm  LV E/e' lateral: 33.0 LV SV:         91 LV SV Index:   40 LVOT Area:     4.91 cm  RIGHT VENTRICLE RV Basal diam:  4.52 cm RV S prime:     5.44 cm/s TAPSE (M-mode): 2.0 cm LEFT ATRIUM              Index       RIGHT ATRIUM           Index LA  diam:        4.50 cm  1.95 cm/m  RA Area:     17.90 cm LA Vol (A2C):   129.0 ml 56.02 ml/m RA Volume:   44.90 ml  19.50 ml/m LA Vol (A4C):   145.0 ml 62.97 ml/m LA Biplane Vol: 138.0 ml 59.93 ml/m  AORTIC VALVE LVOT Vmax:   98.90 cm/s LVOT Vmean:  57.400 cm/s LVOT VTI:    0.186 m  AORTA Ao Root diam: 2.80 cm MITRAL VALVE                TRICUSPID VALVE MV Area (PHT): 1.65 cm     TR Peak grad:   53.6 mmHg MV Area VTI:   1.20 cm     TR Vmax:        366.00 cm/s MV Peak grad:  25.8 mmHg MV Mean grad:  7.5 mmHg     SHUNTS MV Vmax:       2.54 m/s     Systemic VTI:  0.19 m MV Vmean:      117.5 cm/s   Systemic Diam: 2.50 cm MV Decel Time: 459 msec MV E velocity: 183.00 cm/s Riley Lam MD Electronically signed by Riley Lam MD Signature Date/Time: 08/20/2020/12:23:20 PM    Final         Scheduled Meds: . apixaban  5 mg Oral BID  . furosemide  80 mg Intravenous BID  . insulin aspart  0-15 Units Subcutaneous TID WC  . insulin aspart  0-5 Units Subcutaneous QHS  .  insulin aspart  5 Units Subcutaneous TID with meals  . insulin glargine  40 Units Subcutaneous Daily  . loratadine  10 mg Oral Daily  . magnesium oxide  400 mg Oral BID  . metoCLOPramide  5 mg Oral TID AC  . pantoprazole  40 mg Oral Daily  . rosuvastatin  5 mg Oral Daily  . tamsulosin  0.4 mg Oral QPC breakfast  . Vilazodone HCl  40 mg Oral Daily    LOS: 2 days    Time spent: 35 minutes    Kert Shackett Hoover Brunette, DO [facility]  If 7PM-7AM, please contact night-coverage [email or web address] 08/21/2020, 9:52 AM

## 2020-08-21 NOTE — Progress Notes (Signed)
I was notified by CCMD that pts HR was 120 I went into the room and found the charge nurse assessing patient. Vital signs obtained and Dr Sherryll Burger paged orders obtained for EKG, Troponin and chest xray.

## 2020-08-21 NOTE — TOC Initial Note (Signed)
Transition of Care [facility]) - Initial/Assessment Note   Patient Details  Name: Cody Hale MRN: [id number] Date of Birth: December 20, 1950  Transition of Care [facility]) CM/SW Contact:    Ewing Schlein, LCSW Phone Number: 08/21/2020, 9:56 AM  Clinical Narrative: Patient is a 70 year old male who was admitted for acute on chronic respiratory failure with hypoxia. Patient has a history of OSA, type II diabetes mellitus, COPD, GERD, chronic combined systolic and diastolic CHF, and chronic atrial fibrillation.  TOC received consult for CHF screening. Per patient, he resides at [facility]. He currently receives HHPT from [facility]. Current DME includes a walker and wheelchair. Patient reported he requires a fair amount of assistance with bathing and dressing at baseline, but is independent with feeding. Patient reported no issues with being able to take his medications as prescribed. Patient uses [facility] for transportation to appointments.  Per CHF screening, patient reported he follows a heart healthy diet at [facility] and limits his salt intake. Patient reported he does not formally restrict his fluids, but drinks very little throughout the day. Patient reported he does not complete daily weights at [facility] to monitor fluid retention, but staff assists with weekly weight checks.  CSW confirmed with Dondra Spry at [facility] that patient can return to the facility and he is already active with [facility] for HHPT. FL2 started. TOC to follow.  Expected Discharge Plan: Assisted Living Barriers to Discharge: Continued Medical Work up  Patient Goals and CMS Choice Patient states their goals for this hospitalization and ongoing recovery are:: Return to [facility].gov Compare Post Acute Care list provided to:: Patient Choice offered to / list presented to : Patient  Expected Discharge Plan and Services Expected Discharge Plan: Assisted Living In-house Referral: Clinical Social Work Discharge  Planning Services: NA Post Acute Care Choice: Home Health Living arrangements for the past 2 months: Assisted Living Facility              DME Arranged: N/A DME Agency: NA HH Arranged: PT HH Agency: [facility]  Prior Living Arrangements/Services Living arrangements for the past 2 months: Assisted Living Facility Lives with:: Facility Resident Patient language and need for interpreter reviewed:: Yes Do you feel safe going back to the place where you live?: Yes      Need for Family Participation in Patient Care: No (Comment) Care giver support system in place?: Yes (comment) Current home services: DME,Home PT [facility]) Criminal Activity/Legal Involvement Pertinent to Current Situation/Hospitalization: No - Comment as needed  Activities of Daily Living Home Assistive Devices/Equipment: CBG Meter,Walker (specify type),Wheelchair ADL Screening (condition at time of admission) Patient's cognitive ability adequate to safely complete daily activities?: Yes Is the patient deaf or have difficulty hearing?: No Does the patient have difficulty seeing, even when wearing glasses/contacts?: No Does the patient have difficulty concentrating, remembering, or making decisions?: No Patient able to express need for assistance with ADLs?: Yes Does the patient have difficulty dressing or bathing?: Yes Independently performs ADLs?: No Communication: Independent Dressing (OT): Needs assistance Is this a change from baseline?: Pre-admission baseline Grooming: Needs assistance Is this a change from baseline?: Pre-admission baseline Feeding: Independent Bathing: Needs assistance Is this a change from baseline?: Pre-admission baseline Toileting: Needs assistance Is this a change from baseline?: Pre-admission baseline In/Out Bed: Needs assistance Is this a change from baseline?: Pre-admission baseline Walks in Home: Needs assistance Is this a change from baseline?: Pre-admission baseline Does  the patient have difficulty walking or climbing stairs?: Yes Weakness of  Legs: Both Weakness of Arms/Hands: None  Permission Sought/Granted Permission sought to share information with : Oceanographer granted to share info w AGENCY: [facility]; [facility]  Emotional Assessment Appearance:: Appears stated age Attitude/Demeanor/Rapport: Engaged Affect (typically observed): Accepting Orientation: : Oriented to Self,Oriented to Place,Oriented to  Time,Oriented to Situation Alcohol / Substance Use: Not Applicable Psych Involvement: No (comment)  Admission diagnosis:  Peripheral edema [R60.9] Hypoxia [R09.02] Acute on chronic respiratory failure (HCC) [J96.20] Patient Active Problem List   Diagnosis Date Noted  . Acute on chronic respiratory failure (HCC) 08/13/2020  . Acute on chronic respiratory failure with hypoxia (HCC)   . Severe sepsis (HCC)   . Acute on chronic combined systolic and diastolic CHF (congestive heart failure) (HCC)   . Chronic atrial fibrillation (HCC)   . Sleep apnea   . COPD, severe (HCC)   . Endotracheally intubated   . Palliative care by specialist   . Streptococcus G Bacteremia 03/19/2020  . Community acquired pneumonia 03/16/2020  . Sepsis due to pneumonia 03/16/2020  . Need for Tdap vaccination 03/02/2020  . Need for influenza vaccination 03/02/2020  . Iron deficiency anemia due to chronic blood loss 03/02/2020  . Deficiency anemia 03/01/2020  . Allergic rhinitis due to pollen 03/01/2020  . NSVT (nonsustained ventricular tachycardia) (HCC)   . COVID-19   . COPD with acute exacerbation (HCC) 01/13/2019  . Chronic respiratory failure with hypoxia (HCC)   . Major depressive disorder, recurrent severe without psychotic features (HCC) 05/19/2018  . GERD with esophagitis 04/22/2018  . Nonischemic cardiomyopathy (HCC) 03/23/2018  . Therapeutic opioid-induced constipation (OIC) 12/04/2017  . Venous stasis dermatitis of both  lower extremities 07/31/2017  . Unilateral primary osteoarthritis, right hip 04/22/2017  . 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 Seasonal allergic rhinitis due to pollen 03/13/2016  . Chronic atrial fibrillation 01/11/2015  . S/P mitral valve replacement with bioprosthetic valve 01/11/2015  . Depression with somatization 11/22/2014  . Primary osteoarthritis of both knees 11/22/2014  . COPD (chronic obstructive pulmonary disease) with chronic bronchitis (HCC) 05/12/2014  . B12 deficiency anemia 02/03/2014  . Goals of care, counseling/discussion 03/21/2013  . Hyperlipidemia with target LDL less than 70 03/19/2013  . Chronic systolic CHF (congestive heart failure) (HCC) 02/17/2013  . Essential hypertension, malignant 12/01/2012  . Type II diabetes mellitus with manifestations (HCC)   . Gastroparesis due to DM (HCC) 11/30/2012  . OSA (obstructive sleep apnea) 11/16/2012  . Endocarditis-resolved 08/26/2012  . Class 2 severe obesity due to excess calories with serious comorbidity and body mass index (BMI) of 35.0 to 35.9 in adult [facility]) 06/27/2012   PCP:  Shayne Alken, MD Pharmacy:   [facility] [place] - [address]  [place] [address] Phone: [phone] Fax: [phone]  Readmission Risk Interventions Readmission Risk Prevention Plan 03/17/2020  Transportation Screening Complete  Medication Review (RN Care Manager) Complete  HRI or Home Care Consult Complete  SW Recovery Care/Counseling Consult Complete  Palliative Care Screening Not Applicable  Skilled Nursing Facility Not Applicable  Some recent data might be hidden

## 2020-08-22 ENCOUNTER — Inpatient Hospital Stay (HOSPITAL_COMMUNITY): Payer: Medicare Other

## 2020-08-22 DIAGNOSIS — I5043 Acute on chronic combined systolic (congestive) and diastolic (congestive) heart failure: Secondary | ICD-10-CM | POA: Diagnosis not present

## 2020-08-22 DIAGNOSIS — Z515 Encounter for palliative care: Secondary | ICD-10-CM | POA: Diagnosis not present

## 2020-08-22 DIAGNOSIS — J449 Chronic obstructive pulmonary disease, unspecified: Secondary | ICD-10-CM | POA: Diagnosis not present

## 2020-08-22 DIAGNOSIS — J9621 Acute and chronic respiratory failure with hypoxia: Secondary | ICD-10-CM | POA: Diagnosis not present

## 2020-08-22 LAB — GLUCOSE, CAPILLARY
Glucose-Capillary: 122 mg/dL — ABNORMAL HIGH (ref 70–99)
Glucose-Capillary: 136 mg/dL — ABNORMAL HIGH (ref 70–99)
Glucose-Capillary: 146 mg/dL — ABNORMAL HIGH (ref 70–99)
Glucose-Capillary: 117 mg/dL — ABNORMAL HIGH (ref 70–99)

## 2020-08-22 LAB — MAGNESIUM: Magnesium: 2.4 mg/dL (ref 1.7–2.4)

## 2020-08-22 LAB — BLOOD GAS, ARTERIAL
Acid-Base Excess: 7.7 mmol/L — ABNORMAL HIGH (ref 0.0–2.0)
Acid-Base Excess: 7.2 mmol/L — ABNORMAL HIGH (ref 0.0–2.0)
Acid-Base Excess: 8.9 mmol/L — ABNORMAL HIGH (ref 0.0–2.0)
Bicarbonate: 27.7 mmol/L (ref 20.0–28.0)
Bicarbonate: 26.3 mmol/L (ref 20.0–28.0)
Bicarbonate: 31.9 mmol/L — ABNORMAL HIGH (ref 20.0–28.0)
FIO2: 40
FIO2: 45
FIO2: 50
O2 Saturation: 94.2 %
O2 Saturation: 93.6 %
O2 Saturation: 92.7 %
Patient temperature: 37
Patient temperature: 36.4
Patient temperature: 37
pCO2 arterial: 92.6 mmHg (ref 32.0–48.0)
pCO2 arterial: 105 mmHg (ref 32.0–48.0)
pCO2 arterial: 47.9 mmHg (ref 32.0–48.0)
pH, Arterial: 7.207 — ABNORMAL LOW (ref 7.350–7.450)
pH, Arterial: 7.156 — CL (ref 7.350–7.450)
pH, Arterial: 7.455 — ABNORMAL HIGH (ref 7.350–7.450)
pO2, Arterial: 78.6 mmHg — ABNORMAL LOW (ref 83.0–108.0)
pO2, Arterial: 77.7 mmHg — ABNORMAL LOW (ref 83.0–108.0)
pO2, Arterial: 53.6 mmHg — ABNORMAL LOW (ref 83.0–108.0)

## 2020-08-22 LAB — BASIC METABOLIC PANEL
Anion gap: 13 (ref 5–15)
BUN: 32 mg/dL — ABNORMAL HIGH (ref 8–23)
CO2: 31 mmol/L (ref 22–32)
Calcium: 9.1 mg/dL (ref 8.9–10.3)
Chloride: 95 mmol/L — ABNORMAL LOW (ref 98–111)
Creatinine, Ser: 1.46 mg/dL — ABNORMAL HIGH (ref 0.61–1.24)
GFR, Estimated: 52 mL/min — ABNORMAL LOW (ref >60)
Glucose, Bld: 112 mg/dL — ABNORMAL HIGH (ref 70–99)
Potassium: 5.4 mmol/L — ABNORMAL HIGH (ref 3.5–5.1)
Sodium: 139 mmol/L (ref 135–145)

## 2020-08-22 LAB — MRSA PCR SCREENING: MRSA by PCR: POSITIVE — AB

## 2020-08-22 MED ORDER — PROPOFOL 1000 MG/100ML IV EMUL
INTRAVENOUS | Status: AC
  Filled 2020-08-22: qty 100

## 2020-08-22 MED ORDER — CHLORHEXIDINE GLUCONATE 0.12 % MT SOLN: 15.0000 mL | Freq: Two times a day (BID) | OROMUCOSAL | Status: DC

## 2020-08-22 MED ORDER — INSULIN GLARGINE 100 UNIT/ML ~~LOC~~ SOLN
20.0000 [IU] | Freq: Every day | SUBCUTANEOUS | Status: DC
  Administered 2020-08-23 – 2020-08-24 (×2): 20 [IU] via SUBCUTANEOUS
  Filled 2020-08-22 (×4): qty 0.2

## 2020-08-22 MED ORDER — ENOXAPARIN SODIUM 120 MG/0.8ML ~~LOC~~ SOLN
110.0000 mg | Freq: Two times a day (BID) | SUBCUTANEOUS | Status: DC
  Administered 2020-08-22 – 2020-08-24 (×4): 110 mg via SUBCUTANEOUS
  Filled 2020-08-22 (×4): qty 0.8

## 2020-08-22 MED ORDER — ORAL CARE MOUTH RINSE
15.0000 mL | Freq: Two times a day (BID) | OROMUCOSAL | Status: DC
  Administered 2020-08-22: 15 mL via OROMUCOSAL

## 2020-08-22 MED ORDER — MUPIROCIN 2 % EX OINT
1.0000 "application " | TOPICAL_OINTMENT | Freq: Two times a day (BID) | CUTANEOUS | Status: DC
  Administered 2020-08-23 – 2020-08-25 (×5): 1 via NASAL
  Filled 2020-08-22: qty 22

## 2020-08-22 MED ORDER — CHLORHEXIDINE GLUCONATE CLOTH 2 % EX PADS
6.0000 | MEDICATED_PAD | Freq: Every day | CUTANEOUS | Status: DC
  Administered 2020-08-22 – 2020-08-25 (×2): 6 via TOPICAL

## 2020-08-22 MED ORDER — PROPOFOL 1000 MG/100ML IV EMUL
5.0000 ug/kg/min | INTRAVENOUS | Status: DC
  Administered 2020-08-22: 10 ug/kg/min via INTRAVENOUS
  Administered 2020-08-22: 40 ug/kg/min via INTRAVENOUS
  Administered 2020-08-23: 30 ug/kg/min via INTRAVENOUS
  Administered 2020-08-23: 40 ug/kg/min via INTRAVENOUS
  Administered 2020-08-23: 20 ug/kg/min via INTRAVENOUS
  Administered 2020-08-23 – 2020-08-24 (×2): 30 ug/kg/min via INTRAVENOUS
  Administered 2020-08-24: 20 ug/kg/min via INTRAVENOUS
  Administered 2020-08-24: 30.006 ug/kg/min via INTRAVENOUS
  Administered 2020-08-24: 20 ug/kg/min via INTRAVENOUS
  Administered 2020-08-25 (×2): 30 ug/kg/min via INTRAVENOUS
  Filled 2020-08-22 (×5): qty 100
  Filled 2020-08-22: qty 200
  Filled 2020-08-22 (×6): qty 100

## 2020-08-22 MED ORDER — CHLORHEXIDINE GLUCONATE 0.12% ORAL RINSE (MEDLINE KIT)
15.0000 mL | Freq: Two times a day (BID) | OROMUCOSAL | Status: DC
  Administered 2020-08-22 – 2020-08-25 (×6): 15 mL via OROMUCOSAL

## 2020-08-22 MED ORDER — ORAL CARE MOUTH RINSE
15.0000 mL | OROMUCOSAL | Status: DC
  Administered 2020-08-22 – 2020-08-25 (×25): 15 mL via OROMUCOSAL

## 2020-08-22 NOTE — Progress Notes (Signed)
MD made aware of PCO2 of 96.2, orders given to transfer patient to stepdown. Report given to the swot nurse Marisue Ivan, patient transferred off unit to ICU

## 2020-08-22 NOTE — Progress Notes (Signed)
ANTICOAGULATION CONSULT NOTE - Initial Consult  Pharmacy Consult for Lovenox Indication: atrial fibrillation (Apixaban on hold)  Allergies  Allergen Reactions  . Daptomycin Rash  . Lisinopril Cough  . Metformin And Related Diarrhea  . Tape Rash and Other (See Comments)    Adhesive Tape-Burn skin.    Patient Measurements: Height: 5' 9"  (175.3 cm) Weight: 110.2 kg (242 lb 15.2 oz) IBW/kg (Calculated) : 70.7  Vital Signs: Temp: 97.7 F (36.5 C) (02/15 1959) Temp Source: Axillary (02/15 1959) BP: 142/68 (02/15 2000) Pulse Rate: 99 (02/15 2000)  Labs: Recent Labs    08/20/20 0501 08/21/20 0410 08/21/20 1346 08/21/20 1821 08/22/20 0349  HGB  --  13.8  --   --   --   HCT  --  45.9  --   --   --   PLT  --  127*  --   --   --   CREATININE 0.77 1.07  --   --  1.46*  TROPONINIHS  --   --  44* 39*  --     Estimated Creatinine Clearance: 58.4 mL/min (A) (by C-G formula based on SCr of 1.46 mg/dL (H)).   Medical History: Past Medical History:  Diagnosis Date  . A-fib ([place])   . Acute on chronic respiratory failure with hypoxia ([place])   . Anxiety   . Atrial fibrillation ([place])   . Cellulitis   . CHF (congestive heart failure) ([place])   . Chronic atrial fibrillation ([place])   . Chronic combined systolic and diastolic heart failure ([place])   . Chronic venous insufficiency   . COPD (chronic obstructive pulmonary disease) ([place])   . COPD, severe ([place])   . Coronary atherosclerosis of native coronary artery    Mild nonobstructive 08/2012  . Degenerative joint disease   . Diabetes mellitus, type II ([place])    Gastroparesis; GI care at [facility]  . 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 s/p zyvox rx.  (Cultures never positive); left bundle branch block; H/o SVT; 09/2012: bioprosthetic MVR at [facility]; a. Severe dental caries and cavities s/p multiple extractions.  . Essential hypertension, benign   . Gastroparesis   . History of prosthetic mitral valve 09/2012   Bioprosthetic -  [facility]  . Left bundle branch block   . Left leg cellulitis   . Major depressive disorder, recurrent severe without psychotic features ([place])    [place] admission 12/2012  . Morbid obesity ([place]) 06/27/2012  . Nephrolithiasis   . PSVT (paroxysmal supraventricular tachycardia) (HCC)    [facility]-op at [facility]  . Severe sepsis ([place])   . Sleep apnea    a. uses CPAP nightly  . Sleep apnea   . Urinary tract infection 08/30/2012   Proteus mirabilis    Medications:  Scheduled:  . chlorhexidine gluconate (MEDLINE KIT)  15 mL Mouth Rinse BID  . Chlorhexidine Gluconate Cloth  6 each Topical Daily  . furosemide  80 mg Intravenous BID  . insulin aspart  0-15 Units Subcutaneous TID WC  . insulin aspart  0-5 Units Subcutaneous QHS  . [START ON 08/23/2020] insulin glargine  20 Units Subcutaneous Daily  . loratadine  10 mg Oral Daily  . mouth rinse  15 mL Mouth Rinse 10 times per day  . metoCLOPramide  5 mg Oral TID AC  . pantoprazole  40 mg Oral Daily  . rosuvastatin  5 mg Oral Daily  . tamsulosin  0.4 mg Oral QPC breakfast  . Vilazodone HCl  40 mg Oral Daily  Infusions:  . propofol (DIPRIVAN) infusion 20 mcg/kg/min (08/22/20 1949)    Assessment: 70 yo M on Apixaban PTA for afib.  Pt currently intubated and NPO.  Pharmacy has been consulted to dose Lovenox for afib while apixaban on hold.  Last dose given 2/15 at 0800.  Hgb 13.8.  Watch SCr 0.7 > 1 > 1.46.  Will need to adjust Lovenox dose if CrCl falls <30.  Goal of Therapy:  Anti-Xa level 0.6-1 units/ml 4hrs after LMWH dose given Monitor platelets by anticoagulation protocol: Yes   Plan:  Start Lovenox 110 mg sq q12h Follow up renal function and CBC  Latarsha Zani, Pharm.D., BCPS Clinical Pharmacist  **Pharmacist phone directory can be found on [email or web address] listed under [place].  08/22/2020 9:38 PM

## 2020-08-22 NOTE — Progress Notes (Signed)
PROGRESS NOTE    Cody Hale  XBW:[id number] DOB: [birth year] DOA: 09-03-2020 PCP: Cody Alken, MD   Brief Narrative:  Per HPI: Cody Smithis a 70 y.o.malewith a history of atrial fibrillation chronic anticoagulation, chronic systolic and diastolic heart failure, COPD, type 2 diabetes, hypertension. [facility]. Patient was called to the facility due to hypoxia and difficulty breathing. Initially on EMS arrival he was found to have an oxygen saturation in the 80s. He was placed on 2 L nasal cannula. He did have some leg swellings for a couple weeks. His shortness of breath is worse with lying down flat and exertion. Improved with rest and sitting up in bed. No other palliating or provoking factors.  Emergency Department Course: Initial oxygen saturation in the 80s. Improved with nasal cannula 3 L. Patient given IV Lasix. Checks x-ray shows atelectasis and effusions. CTA showed was negative for PE but showed pulmonary edema.  -Patient noted to have recent decline and now appears to have poor oral intake.  He is not responding very well to IV diuresis. Consult palliative care to address goals of care. SLP evaluation ordered as well.  Assessment & Plan:   Active Problems:   OSA (obstructive sleep apnea)   Type II diabetes mellitus with manifestations (HCC)   COPD (chronic obstructive pulmonary disease) with chronic bronchitis (HCC)   GERD with esophagitis   Acute on chronic respiratory failure with hypoxia (HCC)   Acute on chronic combined systolic and diastolic CHF (congestive heart failure) (HCC)   Chronic atrial fibrillation (HCC)   Acute on chronic respiratory failure (HCC)   Acute hypoxemic respiratory failure secondary to acute on chronic combined systolic and diastolic heart failure exacerbation -Continue on Lasix IV twice daily at 80 mg -Continue to monitor daily weights and strict I's and O's. Current weight 239  pounds -Hold potassium supplementation given hyperkalemia -2D echocardiogram performed with similar results as before with LVEF 25-30% -Repeat BMP in a.m. -Appreciate ongoing Cardiology input  Mild hyperkalemia -Currently on IV Lasix -Repeat labs in a.m.  Poor oral intake -SLP evaluation ordered and pending -Palliative evaluation  AKI -Continue to monitor on diuresis -Poor urine output noted  Atrial fibrillation-rate controlled -Patient onEliquisfor anticoagulation -Continue telemetry monitoring  COPD -No signs of wheezing noted -DuoNebs as needed  Type 2 diabetes -Currently without significant hyperglycemia -Continue SSI -Continue long-acting insulin with dose decrease  OSA -CPAP at night   DVT prophylaxis:Eliquis Code Status:Full Family Communication:Called daughter 2/15 Disposition Plan: Status is: Inpatient  Remains inpatient appropriate because:IV treatments appropriate due to intensity of illness or inability to take PO and Inpatient level of care appropriate due to severity of illness   Dispo: The patient is from:ALF Anticipated d/c is to:TBD Anticipated d/c date is: TBD Patient currently is not medically stable to d/c.  He continues to require diuresis, but has poor oral intake.  Palliative consulted to address the goals of care.  SLP evaluation ordered. Difficult to place patient No  Consultants:  Cardiology  Palliative care  Procedures:  See below  Antimicrobials:  None  Subjective: Patient seen and evaluated today.  She overall seems more lethargic and has not been having much oral intake.  He denies any chest pain or shortness of breath.  Objective: Vitals:   08/21/20 2011 08/22/20 0100 08/22/20 0500 08/22/20 0800  BP: (!) 142/72 121/73 (!) 142/81 131/67  Pulse: 73 71 70 66  Resp: 20 18 18 18   Temp: (!) 97.3 F (36.3 C)  98 F (36.7 C)   TempSrc: Oral      SpO2: 99% 96% 93% 93%  Weight:      Height:        Intake/Output Summary (Last 24 hours) at 08/22/2020 1030 Last data filed at 08/22/2020 0500 Gross per 24 hour  Intake 240 ml  Output 200 ml  Net 40 ml   Filed Weights   09/03/2020 0746 September 03, 2020 0843 08/21/20 0500  Weight: 107.5 kg 108.9 kg 108.8 kg    Examination:  General exam: Appears calm and comfortable  Respiratory system: Clear to auscultation. Respiratory effort normal.  Currently on 5 L nasal cannula oxygen. Cardiovascular system: S1 & S2 heard, RRR.  Gastrointestinal system: Abdomen is soft Central nervous system: Alert and awake Extremities: No edema Skin: No significant lesions noted Psychiatry: Flat affect.    Data Reviewed: I have personally reviewed following labs and imaging studies  CBC: Recent Labs  Lab September 03, 2020 0811 08/21/20 0410  WBC 3.4* 5.1  HGB 14.0 13.8  HCT 44.7 45.9  MCV 94.7 97.7  PLT 113* 127*   Basic Metabolic Panel: Recent Labs  Lab 09/03/20 0811 08/20/20 0501 08/21/20 0410 08/22/20 0349  NA 140 143 142 139  K 4.2 5.2* 5.1 5.4*  CL 97* 100 97* 95*  CO2 35* 36* 33* 31  GLUCOSE 111* 107* 93 112*  BUN 18 15 21  32*  CREATININE 0.80 0.77 1.07 1.46*  CALCIUM 8.7* 9.1 9.1 9.1  MG 2.1  --  2.2 2.4   GFR: Estimated Creatinine Clearance: 58 mL/min (A) (by C-G formula based on SCr of 1.46 mg/dL (H)). Liver Function Tests: Recent Labs  Lab 09/03/2020 0811  AST 17  ALT 13  ALKPHOS 83  BILITOT 1.1  PROT 6.7  ALBUMIN 3.5   No results for input(s): LIPASE, AMYLASE in the last 168 hours. No results for input(s): AMMONIA in the last 168 hours. Coagulation Profile: Recent Labs  Lab 09-03-2020 0811  INR 1.5*   Cardiac Enzymes: No results for input(s): CKTOTAL, CKMB, CKMBINDEX, TROPONINI in the last 168 hours. BNP (last 3 results) No results for input(s): PROBNP in the last 8760 hours. HbA1C: Recent Labs    09-03-20 1841  HGBA1C 6.6*   CBG: Recent Labs  Lab 08/21/20 1105  08/21/20 1250 08/21/20 1700 08/21/20 2013 08/22/20 0740  GLUCAP 93 97 125* 127* 122*   Lipid Profile: No results for input(s): CHOL, HDL, LDLCALC, TRIG, CHOLHDL, LDLDIRECT in the last 72 hours. Thyroid Function Tests: No results for input(s): TSH, T4TOTAL, FREET4, T3FREE, THYROIDAB in the last 72 hours. Anemia Panel: No results for input(s): VITAMINB12, FOLATE, FERRITIN, TIBC, IRON, RETICCTPCT in the last 72 hours. Sepsis Labs: Recent Labs  Lab Sep 03, 2020 0813 Sep 03, 2020 1011  LATICACIDVEN 1.3 0.8    Recent Results (from the past 240 hour(s))  Culture, blood (routine x 2)     Status: None (Preliminary result)   Collection Time: 2020/09/03  8:18 AM   Specimen: BLOOD RIGHT FOREARM  Result Value Ref Range Status   Specimen Description BLOOD RIGHT FOREARM  Final   Special Requests   Final    BOTTLES DRAWN AEROBIC AND ANAEROBIC Blood Culture adequate volume   Culture   Final    NO GROWTH 3 DAYS Performed at [facility], [address] [place], [place]    Report Status PENDING  Incomplete  Culture, blood (routine x 2)     Status: None (Preliminary result)   Collection Time: 09/03/2020  8:40 AM  Specimen: BLOOD RIGHT ARM  Result Value Ref Range Status   Specimen Description BLOOD RIGHT ARM  Final   Special Requests   Final    BOTTLES DRAWN AEROBIC AND ANAEROBIC Blood Culture adequate volume   Culture   Final    NO GROWTH 3 DAYS Performed at [facility], [address], [place], [place] [address]    Report Status PENDING  Incomplete  SARS CORONAVIRUS 2 (TAT 6-24 HRS) Nasopharyngeal Nasopharyngeal Swab     Status: None   Collection Time: 2020-09-12  8:50 AM   Specimen: Nasopharyngeal Swab  Result Value Ref Range Status   SARS Coronavirus 2 NEGATIVE NEGATIVE Final    Comment: (NOTE) SARS-CoV-2 target nucleic acids are NOT DETECTED.  The SARS-CoV-2 RNA is generally detectable in upper and lower respiratory specimens during the acute phase of infection. Negative results  do not preclude SARS-CoV-2 infection, do not rule out co-infections with other pathogens, and should not be used as the sole basis for treatment or other patient management decisions. Negative results must be combined with clinical observations, patient history, and epidemiological information. The expected result is Negative.  Fact Sheet for Patients: HairSlick.no  Fact Sheet for Healthcare Providers: quierodirigir.com  This test is not yet approved or cleared by the [place] FDA and  has been authorized for detection and/or diagnosis of SARS-CoV-2 by FDA under an Emergency Use Authorization (EUA). This EUA will remain  in effect (meaning this test can be used) for the duration of the COVID-19 declaration under Se ction 564(b)(1) of the Act, 21 U.S.C. section 360bbb-3(b)(1), unless the authorization is terminated or revoked sooner.  Performed at [facility], [address] [place], [place] [address]          Radiology Studies: DG Chest 1 View  Result Date: 08/21/2020 CLINICAL DATA:  Tachycardia. EXAM: CHEST  1 VIEW COMPARISON:  2020-09-12. FINDINGS: Stable cardiomegaly. Sternotomy wires are noted. No pneumothorax is noted. Old bilateral rib fractures are noted increased bibasilar opacities are noted concerning for worsening edema or atelectasis with associated pleural effusions, left greater than right. IMPRESSION: Increased bibasilar opacities are noted concerning for worsening edema or atelectasis with associated pleural effusions, left greater than right. Electronically Signed   By: Lupita Raider M.D.   On: 08/21/2020 13:58        Scheduled Meds: . apixaban  5 mg Oral BID  . Chlorhexidine Gluconate Cloth  6 each Topical Daily  . furosemide  80 mg Intravenous BID  . insulin aspart  0-15 Units Subcutaneous TID WC  . insulin aspart  0-5 Units Subcutaneous QHS  . insulin aspart  5 Units  Subcutaneous TID with meals  . insulin glargine  40 Units Subcutaneous Daily  . loratadine  10 mg Oral Daily  . magnesium oxide  400 mg Oral BID  . metoCLOPramide  5 mg Oral TID AC  . pantoprazole  40 mg Oral Daily  . rosuvastatin  5 mg Oral Daily  . tamsulosin  0.4 mg Oral QPC breakfast  . Vilazodone HCl  40 mg Oral Daily    LOS: 3 days    Time spent: 35 minutes    Refael Fulop Hoover Brunette, DO [facility]  If 7PM-7AM, please contact night-coverage [email or web address] 08/22/2020, 10:30 AM

## 2020-08-22 NOTE — Progress Notes (Addendum)
Progress Note  Patient Name: Cody Hale Date of Encounter: 08/22/2020  Primary Cardiologist: Prentice Docker, MD (Inactive) --> Needs to switch to new MD  Subjective   Says he did not feel like eating last night or this morning. Has consumed minimal liquids as well. No chest pain or palpitations. Still with orthopnea and lower extremity edema.   Inpatient Medications    Scheduled Meds:  apixaban  5 mg Oral BID   furosemide  80 mg Intravenous BID   insulin aspart  0-15 Units Subcutaneous TID WC   insulin aspart  0-5 Units Subcutaneous QHS   insulin aspart  5 Units Subcutaneous TID with meals   insulin glargine  40 Units Subcutaneous Daily   loratadine  10 mg Oral Daily   magnesium oxide  400 mg Oral BID   metoCLOPramide  5 mg Oral TID AC   pantoprazole  40 mg Oral Daily   rosuvastatin  5 mg Oral Daily   tamsulosin  0.4 mg Oral QPC breakfast   Vilazodone HCl  40 mg Oral Daily   Continuous Infusions:   PRN Meds: acetaminophen, ipratropium-albuterol, ondansetron **OR** ondansetron (ZOFRAN) IV   Vital Signs    Vitals:   08/21/20 2011 08/22/20 0100 08/22/20 0500 08/22/20 0800  BP: (!) 142/72 121/73 (!) 142/81 131/67  Pulse: 73 71 70 66  Resp: 20 18 18 18   Temp: (!) 97.3 F (36.3 C)  98 F (36.7 C)   TempSrc: Oral     SpO2: 99% 96% 93% 93%  Weight:      Height:        Intake/Output Summary (Last 24 hours) at 08/22/2020 0949 Last data filed at 08/22/2020 0500 Gross per 24 hour  Intake 240 ml  Output 200 ml  Net 40 ml    Last 3 Weights 08/21/2020 09/09/2020 09-09-2020  Weight (lbs) 239 lb 13.8 oz 240 lb 1.3 oz 237 lb  Weight (kg) 108.8 kg 108.9 kg 107.502 kg  Some encounter information is confidential and restricted. Go to Review Flowsheets activity to see all data.      Telemetry    Atrial fibrillation, HR in 80's to 90's. PVC's with occasional couplets - Personally Reviewed  ECG    Atrial fibrillation, HR 77 with known LBBB.  - Personally  Reviewed  Physical Exam   General: Elderly Caucasian male appearing in no acute distress. Head: Normocephalic, atraumatic.  Neck: Supple without bruits, JVD at 9 cm. Lungs:  Resp regular and unlabored, rales and scattered rhonchi. Heart: Irregularly irregular, S1, S2, no S3, S4, 2/6 systolic murmur along Apex.  Abdomen: Soft, non-tender, non-distended with normoactive bowel sounds. No hepatomegaly. No rebound/guarding. No obvious abdominal masses. Extremities: No clubbing or cyanosis, 2+ pitting edema bilaterally. Distal pedal pulses are 2+ bilaterally. Neuro: Alert and oriented X 3. Moves all extremities spontaneously. Psych: Normal affect.  Labs    Chemistry Recent Labs  Lab 09-Sep-2020 0811 08/20/20 0501 08/21/20 0410 08/22/20 0349  NA 140 143 142 139  K 4.2 5.2* 5.1 5.4*  CL 97* 100 97* 95*  CO2 35* 36* 33* 31  GLUCOSE 111* 107* 93 112*  BUN 18 15 21  32*  CREATININE 0.80 0.77 1.07 1.46*  CALCIUM 8.7* 9.1 9.1 9.1  PROT 6.7  --   --   --   ALBUMIN 3.5  --   --   --   AST 17  --   --   --   ALT 13  --   --   --  ALKPHOS 83  --   --   --   BILITOT 1.1  --   --   --   GFRNONAA >60 >60 >60 52*  ANIONGAP 8 7 12 13      Hematology Recent Labs  Lab 08/10/2020 0811 08/21/20 0410  WBC 3.4* 5.1  RBC 4.72 4.70  HGB 14.0 13.8  HCT 44.7 45.9  MCV 94.7 97.7  MCH 29.7 29.4  MCHC 31.3 30.1  RDW 16.3* 16.0*  PLT 113* 127*    Cardiac EnzymesNo results for input(s): TROPONINI in the last 168 hours. No results for input(s): TROPIPOC in the last 168 hours.   BNP Recent Labs  Lab 08/23/2020 0811  BNP 386.0*     DDimer No results for input(s): DDIMER in the last 168 hours.   Radiology    DG Chest 1 View  Result Date: 08/21/2020 CLINICAL DATA:  Tachycardia. EXAM: CHEST  1 VIEW COMPARISON:  August 19, 2020. FINDINGS: Stable cardiomegaly. Sternotomy wires are noted. No pneumothorax is noted. Old bilateral rib fractures are noted increased bibasilar opacities are noted  concerning for worsening edema or atelectasis with associated pleural effusions, left greater than right. IMPRESSION: Increased bibasilar opacities are noted concerning for worsening edema or atelectasis with associated pleural effusions, left greater than right. Electronically Signed   By: August 21, 2020 M.D.   On: 08/21/2020 13:58    Cardiac Studies   Echocardiogram: 08/20/2020 IMPRESSIONS     1. Left ventricular ejection fraction, by estimation, is 25 to 30%. The  left ventricle has severely decreased function. The left ventricle  demonstrates global hypokinesis. There is mild concentric left ventricular  hypertrophy. Left ventricular diastolic   parameters are indeterminate.   2. Right ventricular systolic function is moderately reduced. The right  ventricular size is mildly enlarged. There is severely elevated pulmonary  artery systolic pressure. The estimated right ventricular systolic  pressure is 68.6 mmHg.   3. Left atrial size was severely dilated.   4. The mitral valve has been repaired/replaced. No evidence of mitral  valve regurgitation. Moderate mitral stenosis with normal flow suggestived  by gradient assessment.   5. Large pleural effusion.   6. The aortic valve is tricuspid. There is mild calcification of the  aortic valve. There is moderate thickening of the aortic valve. Aortic  valve regurgitation is not visualized. No aortic stenosis is present.   7. The inferior vena cava is dilated in size with <50% respiratory  variability, suggesting right atrial pressure of 15 mmHg.   Comparison(s): A prior study was performed on 04/17/2020. No significant  change from prior study. Prior images reviewed side by side.   Patient Profile     70 y.o. 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   Assessment & Plan    1. Acute on Chronic Combined Systolic and Diastolic CHF - He presented with worsening dyspnea on exertion, orthopnea and edema for the past two weeks with BNP found to be elevated to 386 on admission and CXR consistent with CHF. CTA negative for a PE. Repeat echo shows a reduced EF of 25-30% which is similar to prior imaging  last year.  - He was initially receiving IV Lasix 40mg  BID but this was titrated to 80mg  BID yesterday. I&O's not recorded and weight not yet recorded for today (had declined from 240 --> 239 lbs as of 02/14). He also received 2.5mg  Metolazone yesterday and creatinine has increased from 1.07 to 1.46. Would hold Metolazone today and continue with IV Lasix. May need to readjust Lasix dosing if PO intake remains poor.   2. Chronic Atrial Fibrillation - Rates remain well-controlled by review of telemetry. He has not been on AV nodal blocking agents given prior pauses in 2020. - Remains on Eliquis 5mg  BID for anticoagulation. Hgb at 13.8 on 2/14 with platelets at 127.  3. Bioprosthetic Mitral Valve - S/p surgical repair in 09/2012. Echo this admission shows moderate stenosis with no evidence of regurgitation.   4. HTN - BP was elevated yesterday, improved to 131/67 on most recent check today. Previously on Hydralazine. Continue to follow to see if this needs to be restarted.   5. HLD - He has been continued on PTA Crestor 5mg  daily.   6. Concern for Aspiration - The patient requested water during this encounter and upon drinking, started to cough. Reports he was previously on thickened liquids at rehab but unsure if liquids were thickened at [facility]. Will make the admitting team aware as he will likely need speech therapy evaluation.    For questions or updates, please  contact [facility] Please consult [email or web address] for contact info under Cardiology/STEMI.   Signed, 3/14 , PA-C 9:49 AM 08/22/2020 Pager: [phone]  Patient examined chart reviewed Exam with chronically ill male basilar crackles no murmur LE edema Continue lasix for diuresis agree with holding zaroxyln Afib well rate controlled and nothing to help MVR stenosis except diruetic and rate control to maximize diastolic filling time. He has had a long hospitalization prior to this admission Consider goals of care he appears to be more of a palliative patient to me Not a candidate for inotrope's for CHF given comorbidities and overall functional status   [facility] MD [facility]

## 2020-08-22 NOTE — Progress Notes (Signed)
Pt was noted to have worsening mentation throughout the morning and therefore, ABG was checked demonstrating hypercapnia for which he was transferred to SDU and started on bipap. He continues to have a clinical decline with increase in pCO2 levels and will now require intubation. Notified pulmonology to see in am.

## 2020-08-22 NOTE — Progress Notes (Signed)
Notified Dr. Sherryll Burger of patient neuro status. Notified of ABG results, pH 7.156,CO2 105. Pt remains on bipap.

## 2020-08-22 NOTE — ED Provider Notes (Signed)
I was called at approximately 6:45 PM to intubate the patient.  Patient was not doing well on BiPAP.  The patient was intubated using the glide scope and a 7-1/2 tube was placed without problems.  The tube was placed on the first attempt.  Patient was given etomidate and succinylcholine   Bethann Berkshire, MD 08/22/20 1919

## 2020-08-22 NOTE — Progress Notes (Signed)
SLP Cancellation Note  Patient Details Name: Cody Hale MRN: [id number] DOB: 1951-05-08   Cancelled treatment:       Reason Eval/Treat Not Completed: Medical issues which prohibited therapy (Pt transferred to ICU); BSE ordered this AM, however Pt transferred to ICU and is now on Bi-PAP. Will need new order for BSE if/when medical team would like it.  Thank you,  Havery Moros, CCC-SLP [phone]    August Longest 08/22/2020, 2:23 PM

## 2020-08-22 NOTE — Consult Note (Signed)
Consultation Note Date: 08/22/2020   Patient Name: Cody Hale  DOB: July 08, 1951  MRN: [id number]  Age / Sex: 70 y.o., male  PCP: Rosaria Ferries, MD Referring Physician: Rodena Goldmann, DO  Reason for Consultation: Establishing goals of care  HPI/Patient Profile: 70 y.o. male  with past medical history of CHF (most recent ECHO shows EF 25-30%), mitral valve replacement, a fib, COPD, DM2, HTN, T-11/L4 fusion with wound dehiscence, history of dysphagia with PEG placed 05/2019 and was removed 11/2019 admitted on 08/09/2020 with hypoxia and lower extremity swelling. Workup reveals CHF exacerbation with pleural effusion. Hypercarbic today and transferred to stepdown and placed on bipap. He has been reporting that he doesn't feel well and has poor po intake. There are concerns he is not responding to IV diuresis. Palliative medicine consulted for goals of care.     Clinical Assessment and Goals of Care: Consult received, chart reviewed and patient evaluated.  His most recent admission was this past September 2021 for sepsis/pneumonia during which he spent a prolonged time on the ventilator, unable to wean, and was discharged to [facility] where he was eventually extubated and discharged to SNF and then eventually discharged to Assisted Living facility about 2 weeks ago. Reports he did not have a hospital bed at ALF and does not tolerate laying flat. Per chart review prior to admission he required assistance with bathing and dressing, fed himself, used a walker. Albumin on admission was 3.5.  Palliative medicine note from consult 03/22/2020 reviewed at which time patient's daughter indicated desire for full scope/full code.  Evaluated patient- he was lethargic, on bipap. Did follow commands to squeeze my hand. Unable to participate in [place].  Called his daughter- Cody Hale- left message requesting return call.         Primary Decision Maker NEXT OF KIN- daughter- Cody Hale    SUMMARY OF RECOMMENDATIONS -Full scope/full code- per previous discussions and palliative consults -PMT will continue attempts to reach patient's daughter- Cody Hale -If patient is able to discharge- he is likely to require higher level of care than assisted living as he was at assisted living only 2 weeks before requiring rehospitalization- would also recommend Palliative vs hospice determined by patient's and family's goals -Current SOFA score calculated to be 8 indicating initial risk of mortality 26.3%, [place] mortality risk-  32.81%     Code Status/Advance Care Planning:  Full code  Additional Recommendations (Limitations, Scope, Preferences):  Full Scope Treatment  Prognosis:    Unable to determine-   Discharge Planning: To Be Determined  Primary Diagnoses: Present on Admission: . Acute on chronic respiratory failure with hypoxia ([place]) . Chronic atrial fibrillation ([place]) . COPD (chronic obstructive pulmonary disease) with chronic bronchitis ([place]) . GERD with esophagitis . OSA (obstructive sleep apnea) . Type II diabetes mellitus with manifestations ([place]) . Acute on chronic respiratory failure ([place])   I have reviewed the medical record, interviewed the patient and family, and examined the patient. The following aspects are pertinent.  Past Medical History:  Diagnosis Date  . A-fib ([place])   . Acute on chronic respiratory failure with hypoxia ([place])   . Anxiety   . Atrial fibrillation ([place])   . Cellulitis   . CHF (congestive heart failure) ([place])   . Chronic atrial fibrillation ([place])   . Chronic combined systolic and diastolic heart failure ([place])   . Chronic venous insufficiency   . COPD (chronic obstructive pulmonary disease) ([place])   . COPD, severe ([place])   . Coronary atherosclerosis of native coronary artery    Mild nonobstructive 08/2012  . Degenerative joint disease   . Diabetes mellitus,  type II ([place])    Gastroparesis; [facility] care at [facility]  . 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 s/p zyvox rx.  (Cultures never positive); left bundle branch block; H/o SVT; 09/2012: bioprosthetic MVR at [facility]; a. Severe dental caries and cavities s/p multiple extractions.  . Essential hypertension, benign   . Gastroparesis   . 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 Left bundle branch block   . Left leg cellulitis   . Major depressive disorder, recurrent severe without psychotic features ([place])    [place] admission 12/2012  . Morbid obesity ([place]) 06/27/2012  . Nephrolithiasis   . PSVT (paroxysmal supraventricular tachycardia) ([facility])    Post-op at [facility]  . Severe sepsis ([place])   . Sleep apnea    a. uses CPAP nightly  . Sleep apnea   . Urinary tract infection 08/30/2012   Proteus mirabilis   Social History   Socioeconomic History  . Marital status: Legally Separated    Spouse name: Not on file  . Number of children: 2  . Years of education: Not on file  . Highest education level: Not on file  Occupational History  . Not on file  Tobacco Use  . Smoking status: Former Smoker    Packs/day: 1.00    Years: 20.00    Pack years: 20.00    Types: Cigarettes    Quit date: 07/08/1992    Years since quitting: 28.1  . Smokeless tobacco: Never Used  . Tobacco comment: smoked about 1.5ppd x 15 yrs, quit 15 yrs ago.  Vaping Use  . Vaping Use: Never used  Substance and Sexual Activity  . Alcohol use: No    Alcohol/week: 0.0 standard drinks  . Drug use: No  . Sexual activity: Not Currently  Other Topics Concern  . Not on file  Social History Narrative   Lives in [place] --split with his wife 12/2012.  Has one living daughter, 2 grandchildren.   Had a son who died of a brain tumor at age 35yr.   No longer works since having mitral valve replacement.   Does not routinely exercise but starts cardiac rehab 11/18/12.   Tob 30 pack-yr hx, quit 1990s.     Alcohol: none  in 30 yrs.  Distant history of heavy alcohol use.   No drug use.                  Social Determinants of Health   Financial Resource Strain: Not on file  Food Insecurity: Not on file  Transportation Needs: Not on file  Physical Activity: Not on file  Stress: Not on file  Social Connections: Not on file   Scheduled Meds: . apixaban  5 mg Oral BID  . chlorhexidine  15 mL Mouth Rinse BID  . Chlorhexidine Gluconate Cloth  6 each Topical Daily  . furosemide  80 mg  Intravenous BID  . insulin aspart  0-15 Units Subcutaneous TID WC  . insulin aspart  0-5 Units Subcutaneous QHS  . [START ON 08/23/2020] insulin glargine  20 Units Subcutaneous Daily  . loratadine  10 mg Oral Daily  . mouth rinse  15 mL Mouth Rinse q12n4p  . metoCLOPramide  5 mg Oral TID AC  . pantoprazole  40 mg Oral Daily  . rosuvastatin  5 mg Oral Daily  . tamsulosin  0.4 mg Oral QPC breakfast  . Vilazodone HCl  40 mg Oral Daily   Continuous Infusions: PRN Meds:.acetaminophen, ipratropium-albuterol, ondansetron **OR** ondansetron (ZOFRAN) IV Medications Prior to Admission:  Prior to Admission medications   Medication Sig Start Date End Date Taking? Authorizing Provider  acetaminophen (TYLENOL) 325 MG tablet Take 325 mg by mouth every 6 (six) hours as needed.   Yes [provider]  albuterol (PROVENTIL) (2.5 MG/3ML) 0.083% nebulizer solution Take 2.5 mg by nebulization every 6 (six) hours as needed for wheezing or shortness of breath.   Yes [provider]  ascorbic acid (VITAMIN C) 500 MG tablet Take 500 mg by mouth daily.   Yes [provider]  atorvastatin (LIPITOR) 10 MG tablet Take 10 mg by mouth at bedtime. 08/03/20  Yes [provider]  blood glucose meter kit and supplies KIT Use to check blood sugar 3x per day. DX: E11.8 Accu Check 03/04/19  Yes [name], MD  ELIQUIS 5 MG TABS tablet Take 5 mg by mouth 2 (two) times daily. For Atrial Fibrillation 08/03/20  Yes [provider]  ferrous sulfate 325 (65 FE) MG tablet Take 1 tablet (325 mg total) by mouth 2 (two) times daily with a meal. 03/02/20  Yes [name], MD  furosemide (LASIX) 20 MG tablet Take 20-40 mg by mouth 3 (three) times daily. Take $RemoveBef'40mg'vTyVecWdGv$  PO in the morning. Take $RemoveBefor'20mg'SiohEhTrYSRs$  PO in the evening 08/15/20  Yes [provider]  glucose blood (COOL BLOOD GLUCOSE TEST STRIPS) test strip Use to check blood sugar 3x per day. DX: E11.8 Accu Check 03/04/19  Yes [name], MD  hydrALAZINE (APRESOLINE) 10 MG tablet Take 3 tablets by mouth every 6 (six) hours. 08/03/20  Yes [provider]  Insulin Pen Needle (NOVOFINE AUTOCOVER) 30G X 8 MM MISC USE TID. 02/23/19  Yes [name], MD  ipratropium-albuterol (DUONEB) 0.5-2.5 (3) MG/3ML SOLN Take 3 mLs by nebulization every 6 (six) hours as needed.    Yes [provider]  Lancets (ACCU-CHEK SOFT TOUCH) lancets Use to check blood sugar 3x per day. DX: E11.8 Accu Check 03/04/19  Yes [name], MD  loratadine (CLARITIN) 10 MG tablet Take 10 mg by mouth daily.   Yes [provider]  Magnesium Oxide 400 (240 Mg) MG TABS Take 1 tablet by mouth 2 (two) times daily. 08/03/20  Yes [provider]  Melatonin 3 MG SUBL Take 1 tablet by mouth at bedtime as needed for sleep. 08/03/20  Yes [provider]  Multiple Vitamin (MULTIVITAMIN WITH MINERALS) TABS tablet Place 1 tablet into feeding tube daily. 06/18/19  Yes Mikhail, [place], DO  Nutritional Supplements (FEEDING SUPPLEMENT, OSMOLITE 1.5 CAL,) LIQD Place 1,000 mLs into feeding tube continuous. 03/30/20  Yes Shah, Pratik D, DO  Nutritional Supplements (FEEDING SUPPLEMENT, PROSOURCE TF,) liquid Place 45 mLs into feeding tube 2 (two) times daily. 03/29/20  Yes Shah, Pratik D, DO  Nutritional Supplements (FEEDING SUPPLEMENT, VITAL HIGH PROTEIN,) LIQD liquid Place 1,000 mLs into  feeding tube continuous. 03/29/20  Yes Shah, Pratik D, DO  pantoprazole (PROTONIX) 40 MG  tablet Take 40 mg by mouth daily. 08/03/20  Yes [provider]  saccharomyces boulardii (FLORASTOR) 250 MG capsule Take 250 mg by mouth daily.   Yes [provider]  tamsulosin (FLOMAX) 0.4 MG CAPS capsule Take 1 capsule (0.4 mg total) by mouth daily after breakfast. 06/18/19  Yes Mikhail, [place], DO  TUSSIN MUCUS+CHEST CONGESTION 100 MG/5ML syrup Take 10 mLs by mouth 4 (four) times daily. For CHF 08/03/20  Yes [provider]  Vilazodone HCl (VIIBRYD) 40 MG TABS Take 40 mg by mouth daily.   Yes [provider]  ALLERGY RELIEF 10 MG tablet Take 10 mg by mouth daily. 08/03/20   [provider]  insulin aspart (NOVOLOG FLEXPEN) 100 UNIT/ML FlexPen Inject 5 Units into the skin with breakfast, with lunch, and with evening meal. Patient not taking: Reported on 08/30/2020 01/13/19   [provider]  levocetirizine (XYZAL) 5 MG tablet Place 1 tablet (5 mg total) into feeding tube every evening. Patient not taking: Reported on 08/28/2020 06/18/19   Cristal Ford, DO  metoprolol tartrate (LOPRESSOR) 5 MG/5ML SOLN injection Inject 5 mLs (5 mg total) into the vein every 8 (eight) hours. Patient not taking: No sig reported 03/29/20   Manuella Ghazi, Pratik D, DO  Midazolam HCl (MIDAZOLAM 50MG/50ML, 1MG/ML, PREMIX INFUSION) Inject 0-10 mg/hr into the vein continuous. Patient not taking: No sig reported 03/29/20   Heath Lark D, DO  mupirocin ointment (BACTROBAN) 2 % On leg rash bid Patient not taking: No sig reported 02/17/20   Plotnikov, Evie Lacks, MD  omeprazole (PRILOSEC) 40 MG capsule Take 40 mg by mouth daily. Patient not taking: Reported on 08/24/2020    [provider]  pantoprazole sodium (PROTONIX) 40 mg/20 mL PACK Place 20 mLs (40 mg total) into feeding tube daily. Patient not taking: No sig reported 06/18/19   Cristal Ford, DO  potassium chloride 20 MEQ/15ML (10%) SOLN Take 15 mLs (20 mEq total) by mouth daily. Patient not taking: No sig reported  03/30/20   Heath Lark D, DO  rivaroxaban (XARELTO) 20 MG TABS tablet Take one tablet daily via tube. Patient not taking: Reported on 09/02/2020 06/18/19   Cristal Ford, DO   Allergies  Allergen Reactions  . Daptomycin Rash  . Lisinopril Cough  . Metformin And Related Diarrhea  . Tape Rash and Other (See Comments)    Adhesive Tape-Burn skin.   Review of Systems  Unable to perform ROS: Acuity of condition    Physical Exam Vitals and nursing note reviewed.  Constitutional:      Appearance: He is ill-appearing.     Comments: lethargic  Neurological:     Comments: Not following commands     Vital Signs: BP (!) 128/51   Pulse (!) 102   Temp 98.3 F (36.8 C) (Axillary)   Resp (!) 26   Ht _0  (1.753 m)   Wt 110.2 kg   SpO2 90%   BMI 35.88 kg/m  Pain Scale: 0-10   Pain Score: 0-No pain   SpO2: SpO2: 90 % O2 Device:SpO2: 90 % O2 Flow Rate: .O2 Flow Rate (L/min): 5.2 L/min  IO: Intake/output summary:   Intake/Output Summary (Last 24 hours) at 08/22/2020 1345 Last data filed at 08/22/2020 0500 Gross per 24 hour  Intake -  Output 200 ml  Net -200 ml    LBM: Last BM Date: 08/20/20 Baseline Weight: Weight: 107.5 kg Most  recent weight: Weight: 110.2 kg     Palliative Assessment/Data: PPS: 20%     Thank you for this consult. Palliative medicine will continue to follow and assist as needed.   Time In: 1242 Time Out: 1344 Time Total: 62 minutes Greater than 50%  of this time was spent counseling and coordinating care related to the above assessment and plan.  Signed by: Mariana Kaufman, AGNP-C Palliative Medicine    Please contact Palliative Medicine Team phone at [phone] for questions and concerns.  For individual provider: See Shea Evans

## 2020-08-22 NOTE — Progress Notes (Signed)
Additional non face to face encounter-   Received return call from patient's daughter- Cody Hale.   Discussed concerns for her father's current state of health and trajectory.  She notes that she was having similar conversations about her Dad last September.  Cody Hale was reporting to her that he was doing well at his ALF.  She and her Dad have not had an opportunity to discuss advanced care planning decisions.  Based on his wishes expressed previously- Cody Hale prefers for full scope treatment, with Full Code and intubation if needed.   PMT will follow. Cody Hale is hopeful to be able to have discussions with her Dad as well regarding his care decisions.   Ocie Bob, AGNP-C Palliative Medicine  Greater than 50%  of this time was spent counseling and coordinating care related to the patient's medical conditions and plan of care.   Please call Palliative Medicine team phone with any questions [phone]. For individual providers please see AMION.  Total time: 28 minutes

## 2020-08-23 ENCOUNTER — Inpatient Hospital Stay (HOSPITAL_COMMUNITY): Payer: Medicare Other

## 2020-08-23 DIAGNOSIS — I482 Chronic atrial fibrillation, unspecified: Secondary | ICD-10-CM

## 2020-08-23 DIAGNOSIS — J9601 Acute respiratory failure with hypoxia: Secondary | ICD-10-CM

## 2020-08-23 DIAGNOSIS — R0902 Hypoxemia: Secondary | ICD-10-CM | POA: Diagnosis not present

## 2020-08-23 DIAGNOSIS — I5043 Acute on chronic combined systolic (congestive) and diastolic (congestive) heart failure: Secondary | ICD-10-CM | POA: Diagnosis not present

## 2020-08-23 DIAGNOSIS — J9621 Acute and chronic respiratory failure with hypoxia: Secondary | ICD-10-CM

## 2020-08-23 DIAGNOSIS — Z515 Encounter for palliative care: Secondary | ICD-10-CM | POA: Diagnosis not present

## 2020-08-23 DIAGNOSIS — J449 Chronic obstructive pulmonary disease, unspecified: Secondary | ICD-10-CM | POA: Diagnosis not present

## 2020-08-23 LAB — BLOOD GAS, ARTERIAL
Acid-Base Excess: 9.9 mmol/L — ABNORMAL HIGH (ref 0.0–2.0)
Acid-Base Excess: 10.8 mmol/L — ABNORMAL HIGH (ref 0.0–2.0)
Bicarbonate: 34.2 mmol/L — ABNORMAL HIGH (ref 20.0–28.0)
Bicarbonate: 34.3 mmol/L — ABNORMAL HIGH (ref 20.0–28.0)
FIO2: 50
FIO2: 50
O2 Saturation: 96.4 %
O2 Saturation: 98.4 %
Patient temperature: 37
Patient temperature: 37.6
pCO2 arterial: 32.9 mmHg (ref 32.0–48.0)
pCO2 arterial: 43.4 mmHg (ref 32.0–48.0)
pH, Arterial: 7.602 (ref 7.350–7.450)
pH, Arterial: 7.513 — ABNORMAL HIGH (ref 7.350–7.450)
pO2, Arterial: 63.3 mmHg — ABNORMAL LOW (ref 83.0–108.0)
pO2, Arterial: 115 mmHg — ABNORMAL HIGH (ref 83.0–108.0)

## 2020-08-23 LAB — CBC
HCT: 41.9 % (ref 39.0–52.0)
Hemoglobin: 12.5 g/dL — ABNORMAL LOW (ref 13.0–17.0)
MCH: 28.9 pg (ref 26.0–34.0)
MCHC: 29.8 g/dL — ABNORMAL LOW (ref 30.0–36.0)
MCV: 96.8 fL (ref 80.0–100.0)
Platelets: 109 10*3/uL — ABNORMAL LOW (ref 150–400)
RBC: 4.33 MIL/uL (ref 4.22–5.81)
RDW: 15.9 % — ABNORMAL HIGH (ref 11.5–15.5)
WBC: 5.7 10*3/uL (ref 4.0–10.5)
nRBC: 0 % (ref 0.0–0.2)

## 2020-08-23 LAB — BASIC METABOLIC PANEL
Anion gap: 14 (ref 5–15)
BUN: 50 mg/dL — ABNORMAL HIGH (ref 8–23)
CO2: 29 mmol/L (ref 22–32)
Calcium: 8.9 mg/dL (ref 8.9–10.3)
Chloride: 96 mmol/L — ABNORMAL LOW (ref 98–111)
Creatinine, Ser: 1.62 mg/dL — ABNORMAL HIGH (ref 0.61–1.24)
GFR, Estimated: 46 mL/min — ABNORMAL LOW (ref >60)
Glucose, Bld: 76 mg/dL (ref 70–99)
Potassium: 4.2 mmol/L (ref 3.5–5.1)
Sodium: 139 mmol/L (ref 135–145)

## 2020-08-23 LAB — MAGNESIUM
Magnesium: 2.4 mg/dL (ref 1.7–2.4)
Magnesium: 2.3 mg/dL (ref 1.7–2.4)

## 2020-08-23 LAB — TRIGLYCERIDES: Triglycerides: 110 mg/dL (ref <150)

## 2020-08-23 LAB — GLUCOSE, CAPILLARY
Glucose-Capillary: 85 mg/dL (ref 70–99)
Glucose-Capillary: 95 mg/dL (ref 70–99)
Glucose-Capillary: 91 mg/dL (ref 70–99)
Glucose-Capillary: 77 mg/dL (ref 70–99)
Glucose-Capillary: 83 mg/dL (ref 70–99)

## 2020-08-23 LAB — PHOSPHORUS: Phosphorus: 2.7 mg/dL (ref 2.5–4.6)

## 2020-08-23 MED ORDER — FENTANYL CITRATE (PF) 100 MCG/2ML IJ SOLN: 25.0000 ug | INTRAMUSCULAR | Status: DC | PRN

## 2020-08-23 MED ORDER — IPRATROPIUM-ALBUTEROL 0.5-2.5 (3) MG/3ML IN SOLN
3.0000 mL | Freq: Three times a day (TID) | RESPIRATORY_TRACT | Status: DC
  Administered 2020-08-23 – 2020-08-24 (×5): 3 mL via RESPIRATORY_TRACT
  Filled 2020-08-23 (×5): qty 3

## 2020-08-23 MED ORDER — VITAL AF 1.2 CAL PO LIQD
1000.0000 mL | ORAL | Status: DC
  Administered 2020-08-23: 1000 mL

## 2020-08-23 MED ORDER — VITAL HIGH PROTEIN PO LIQD: 1000.0000 mL | ORAL | Status: DC

## 2020-08-23 MED ORDER — MILRINONE LACTATE IN DEXTROSE 20-5 MG/100ML-% IV SOLN
0.2500 ug/kg/min | INTRAVENOUS | Status: DC
  Administered 2020-08-23 – 2020-08-24 (×2): 0.25 ug/kg/min via INTRAVENOUS
  Filled 2020-08-23 (×4): qty 100

## 2020-08-23 MED ORDER — IOPAMIDOL (ISOVUE-300) INJECTION 61%
50.0000 mL | Freq: Once | INTRAVENOUS | Status: AC | PRN
  Administered 2020-08-23: 20 mL via ORAL

## 2020-08-23 MED ORDER — PROSOURCE TF PO LIQD
90.0000 mL | Freq: Three times a day (TID) | ORAL | Status: DC
  Administered 2020-08-23 – 2020-08-24 (×2): 90 mL
  Filled 2020-08-23 (×2): qty 90

## 2020-08-23 MED ORDER — BUDESONIDE 0.5 MG/2ML IN SUSP
0.5000 mg | Freq: Two times a day (BID) | RESPIRATORY_TRACT | Status: DC
  Administered 2020-08-23 – 2020-08-24 (×2): 0.5 mg via RESPIRATORY_TRACT
  Filled 2020-08-23 (×2): qty 2

## 2020-08-23 NOTE — Progress Notes (Signed)
CRITICAL VALUE ALERT  Critical Value:  PH- 7.602  Date & Time Notied:  08/23/2020 0450  Provider Notified: Thomes Dinning DO  Orders Received/Actions taken: awaiting orders

## 2020-08-23 NOTE — Progress Notes (Addendum)
PROGRESS NOTE  Cody Hale  JOI:[id number] DOB: Jan 24, 1951 DOA: 09/02/2020 PCP: Rosaria Ferries, MD  Brief Narrative:  Per HPI: Cody Smithis a 70 y.o.malewith a history of atrial fibrillation chronic anticoagulation, chronic systolic and diastolic heart failure, COPD, type 2 diabetes, hypertension. [facility]ident of [facility]. Patient was called to the facility due to hypoxia and difficulty breathing. Initially on EMS arrival he was found to have an oxygen saturation in the 80s. He was placed on 2 L nasal cannula. He did have some leg swellings for a couple weeks. His shortness of breath is worse with lying down flat and exertion. Improved with rest and sitting up in bed. No other palliating or provoking factors.  He had increasing CO2 and poor response to bipap therapy and subsequently intubated due to altered mentation on 2/15.    Assessment & Plan:   Active Problems:   OSA (obstructive sleep apnea)   Type II diabetes mellitus with manifestations (HCC)   COPD (chronic obstructive pulmonary disease) with chronic bronchitis (HCC)   GERD with esophagitis   Acute on chronic respiratory failure with hypoxia (HCC)   Acute on chronic combined systolic and diastolic CHF (congestive heart failure) (HCC)   Chronic atrial fibrillation (HCC)   Acute on chronic respiratory failure (HCC)   Acute hypoxemic respiratory failure secondary to acute on chronic combined systolic and diastolic heart failure exacerbation -Continue on Lasix 80 mg IV BID management per cardiology team -Continue to monitor daily weights and strict I's and O's.  -2D echocardiogram performed with similar results as before with LVEF 25-30% -Follow BMP -Appreciate ongoing Cardiology input -PCCM consulted for vent management.  -wean from vent as able   [facility] Weights   08/21/20 0500 08/22/20 1249 08/23/20 0334  Weight: 108.8 kg 110.2 kg 106.3 kg    Intake/Output Summary (Last 24 hours) at  08/23/2020 1152 Last data filed at 08/23/2020 1108 Gross per 24 hour  Intake 394.06 ml  Output 640 ml  Net -245.94 ml   Mild hyperkalemia - RESOLVED   Poor oral intake/Failure to thrive -NG placement under fluoro has been ordered.  -Palliative evaluation  AKI -Continue to monitor on diuresis -Poor urine output noted  Atrial fibrillation-rate controlled -Patient onEliquisfor anticoagulation -Continue telemetry monitoring  COPD -No signs of acute exacerbation -DuoNebs as needed  Type 2 diabetes -Currently without significant hyperglycemia -Continue SSI -Continue long-acting insulin with dose decrease  CBG (last 3)  Recent Labs    08/22/20 1956 08/23/20 0810 08/23/20 1112  GLUCAP 117* 85 95    OSA -He remains intubated  Goals of care -Discussed with cardiology, pt not a candidate for advanced therapies given comorbidities, cardiology recommended palliative care.  I am reaching out to palliative care team for further input.    DVT prophylaxis:Eliquis Code Status:Full Family Communication:Called daughter 2/15 Disposition Plan: Status is: Inpatient  Remains inpatient appropriate because:IV treatments appropriate due to intensity of illness or inability to take PO and Inpatient level of care appropriate due to severity of illness  Dispo: The patient is from:ALF Anticipated d/c is to:TBD Anticipated d/c date is: TBD Patient currently is not medically stable to d/c.  He continues to require diuresis, but has poor oral intake.  Palliative consulted to address the goals of care.  SLP evaluation ordered. Difficult to place patient No  Consultants:  Cardiology  Palliative care  Procedures:  See below  Antimicrobials:  None  Subjective: Patient ended up getting intubated on 2/15 due to progressive obtundation and not  responding well to bipap  Objective: Vitals:   08/23/20 0900  08/23/20 1000 08/23/20 1100 08/23/20 1109  BP: 128/87 (!) 141/47 (!) 113/46   Pulse: 99 78 70 65  Resp: 20 (!) 22 (!) 30 (!) 26  Temp:    98.3 F (36.8 C)  TempSrc:    Axillary  SpO2: 99% 99% 98% 99%  Weight:      Height:        Intake/Output Summary (Last 24 hours) at 08/23/2020 1149 Last data filed at 08/23/2020 1108 Gross per 24 hour  Intake 394.06 ml  Output 640 ml  Net -245.94 ml   Filed Weights   08/21/20 0500 08/22/20 1249 08/23/20 0334  Weight: 108.8 kg 110.2 kg 106.3 kg    Examination:  General exam: sedated on vent.   Respiratory system: coarse upper airway congestion and crackles heard.  Cardiovascular system: normal S1 & S2 heard. No M/R/G.  Gastrointestinal system: Abdomen is soft, nondistended, no masses palpated Central nervous system: sedated Extremities: no C/C/E.  Skin: no gross lesions seen.  Psychiatry: sedated.   Data Reviewed: I have personally reviewed following labs and imaging studies  CBC: Recent Labs  Lab 08/24/2020 0811 08/21/20 0410 08/23/20 0518  WBC 3.4* 5.1 5.7  HGB 14.0 13.8 12.5*  HCT 44.7 45.9 41.9  MCV 94.7 97.7 96.8  PLT 113* 127* 700*   Basic Metabolic Panel: Recent Labs  Lab 08/11/2020 0811 08/20/20 0501 08/21/20 0410 08/22/20 0349 08/23/20 0518  NA 140 143 142 139 139  K 4.2 5.2* 5.1 5.4* 4.2  CL 97* 100 97* 95* 96*  CO2 35* 36* 33* 31 29  GLUCOSE 111* 107* 93 112* 76  BUN _0 32* 50*  CREATININE 0.80 0.77 1.07 1.46* 1.62*  CALCIUM 8.7* 9.1 9.1 9.1 8.9  MG 2.1  --  2.2 2.4 2.4   GFR: Estimated Creatinine Clearance: 51.7 mL/min (A) (by C-G formula based on SCr of 1.62 mg/dL (H)). Liver Function Tests: Recent Labs  Lab 08/17/2020 0811  AST 17  ALT 13  ALKPHOS 83  BILITOT 1.1  PROT 6.7  ALBUMIN 3.5   No results for input(s): LIPASE, AMYLASE in the last 168 hours. No results for input(s): AMMONIA in the last 168 hours. Coagulation Profile: Recent Labs  Lab 08/10/2020 0811  INR 1.5*   Cardiac  Enzymes: No results for input(s): CKTOTAL, CKMB, CKMBINDEX, TROPONINI in the last 168 hours. BNP (last 3 results) No results for input(s): PROBNP in the last 8760 hours. HbA1C: No results for input(s): HGBA1C in the last 72 hours. CBG: Recent Labs  Lab 08/22/20 1204 08/22/20 1540 08/22/20 1956 08/23/20 0810 08/23/20 1112  GLUCAP 136* 146* 117* 85 95   Lipid Profile: Recent Labs    08/23/20 0518  TRIG 110   Thyroid Function Tests: No results for input(s): TSH, T4TOTAL, FREET4, T3FREE, THYROIDAB in the last 72 hours. Anemia Panel: No results for input(s): VITAMINB12, FOLATE, FERRITIN, TIBC, IRON, RETICCTPCT in the last 72 hours. Sepsis Labs: Recent Labs  Lab 08/23/2020 0813 08/18/2020 1011  LATICACIDVEN 1.3 0.8    Recent Results (from the past 240 hour(s))  Culture, blood (routine x 2)     Status: None (Preliminary result)   Collection Time: 09/03/2020  8:18 AM   Specimen: BLOOD RIGHT FOREARM  Result Value Ref Range Status   Specimen Description BLOOD RIGHT FOREARM  Final   Special Requests   Final    BOTTLES DRAWN AEROBIC AND ANAEROBIC Blood Culture adequate volume  Culture   Final    NO GROWTH 4 DAYS Performed at [facility], [address] [place], [place] [address]    Report Status PENDING  Incomplete  Culture, blood (routine x 2)     Status: None (Preliminary result)   Collection Time: 08/31/2020  8:40 AM   Specimen: BLOOD RIGHT ARM  Result Value Ref Range Status   Specimen Description BLOOD RIGHT ARM  Final   Special Requests   Final    BOTTLES DRAWN AEROBIC AND ANAEROBIC Blood Culture adequate volume   Culture   Final    NO GROWTH 4 DAYS Performed at [facility], [address], [place], [place] [address]    Report Status PENDING  Incomplete  SARS CORONAVIRUS 2 (TAT 6-24 HRS) Nasopharyngeal Nasopharyngeal Swab     Status: None   Collection Time: 08/18/2020  8:50 AM   Specimen: Nasopharyngeal Swab  Result Value Ref Range Status   SARS Coronavirus 2 NEGATIVE  NEGATIVE Final    Comment: (NOTE) SARS-CoV-2 target nucleic acids are NOT DETECTED.  The SARS-CoV-2 RNA is generally detectable in upper and lower respiratory specimens during the acute phase of infection. Negative results do not preclude SARS-CoV-2 infection, do not rule out co-infections with other pathogens, and should not be used as the sole basis for treatment or other patient management decisions. Negative results must be combined with clinical observations, patient history, and epidemiological information. The expected result is Negative.  Fact Sheet for Patients: SugarRoll.be  Fact Sheet for Healthcare Providers: https://www.woods-mathews.com/  This test is not yet approved or cleared by the [place] FDA and  has been authorized for detection and/or diagnosis of SARS-CoV-2 by FDA under an Emergency Use Authorization (EUA). This EUA will remain  in effect (meaning this test can be used) for the duration of the COVID-19 declaration under Se ction 564(b)(1) of the Act, 21 U.S.C. section 360bbb-3(b)(1), unless the authorization is terminated or revoked sooner.  Performed at [facility], [place] [address], [place], [place] [address]   MRSA PCR Screening     Status: Abnormal   Collection Time: 08/22/20 12:55 PM   Specimen: Nasopharyngeal  Result Value Ref Range Status   MRSA by PCR POSITIVE (A) NEGATIVE Final    Comment:        The GeneXpert MRSA Assay (FDA approved for NASAL specimens only), is one component of a comprehensive MRSA colonization surveillance program. It is not intended to diagnose MRSA infection nor to guide or monitor treatment for MRSA infections. RESULT CALLED TO, READ BACK BY AND VERIFIED WITH: DELLER,J @ 2113 ON 08/22/20 BY JUW Performed at [facility], [address], [place],  [address]    Radiology Studies: DG Chest 1 View  Result Date: 08/23/2020 CLINICAL DATA:  Respiratory failure.  EXAM: CHEST  1 VIEW COMPARISON:  08/22/2020. FINDINGS: Endotracheal tube in stable position. Prior cardiac valve replacement. Cardiomegaly with pulmonary venous congestion again noted. Bilateral interstitial prominence consistent interstitial edema again noted. Left base atelectasis. Bilateral pleural effusions left side greater than right again noted. Similar findings noted on prior exam. Old bilateral rib fractures again noted. Prior thoracolumbar spine fusion. IMPRESSION: 1.  Endotracheal tube in stable position. 2. Prior cardiac valve replacement. Cardiomegaly with pulmonary venous congestion. Bilateral interstitial prominence most consistent with interstitial edema again noted. Left base atelectasis. Bilateral pleural effusions left side greater than right again noted. Chest is unchanged from prior exam. Electronically Signed   By: Marcello Moores  Register   On: 08/23/2020 06:31  DG Chest 1 View  Result Date: 08/21/2020 CLINICAL DATA:  Tachycardia. EXAM: CHEST  1 VIEW COMPARISON:  August 19, 2020. FINDINGS: Stable cardiomegaly. Sternotomy wires are noted. No pneumothorax is noted. Old bilateral rib fractures are noted increased bibasilar opacities are noted concerning for worsening edema or atelectasis with associated pleural effusions, left greater than right. IMPRESSION: Increased bibasilar opacities are noted concerning for worsening edema or atelectasis with associated pleural effusions, left greater than right. Electronically Signed   By: Marijo Conception M.D.   On: 08/21/2020 13:58   DG CHEST PORT 1 VIEW  Result Date: 08/22/2020 CLINICAL DATA:  Intubated EXAM: PORTABLE CHEST 1 VIEW COMPARISON:  08/21/2020, CT chest 08/30/2020, radiograph 04/26/2020 FINDINGS: Endotracheal tube tip is about 4 cm superior to the carina. There are post sternotomy changes. Large left pleural effusion and moderate right pleural effusion, appears increased on the left side. Cardiomegaly with vascular congestion and pulmonary  edema. Left greater than right basilar airspace disease. Aortic atherosclerosis. No pneumothorax. Bilateral rib fractures appear chronic. IMPRESSION: 1. Endotracheal tube tip about 4 cm superior to the carina. 2. Cardiomegaly with vascular congestion and pulmonary edema. Large left and moderate right pleural effusions with left greater than right basilar airspace disease. Effusion on the left is increased compared to prior. Electronically Signed   By: Donavan Foil M.D.   On: 08/22/2020 20:13   Scheduled Meds: . chlorhexidine gluconate (MEDLINE KIT)  15 mL Mouth Rinse BID  . Chlorhexidine Gluconate Cloth  6 each Topical Daily  . enoxaparin (LOVENOX) injection  110 mg Subcutaneous BID  . furosemide  80 mg Intravenous BID  . insulin aspart  0-15 Units Subcutaneous TID WC  . insulin aspart  0-5 Units Subcutaneous QHS  . insulin glargine  20 Units Subcutaneous Daily  . ipratropium-albuterol  3 mL Nebulization TID  . loratadine  10 mg Oral Daily  . mouth rinse  15 mL Mouth Rinse 10 times per day  . metoCLOPramide  5 mg Oral TID AC  . mupirocin ointment  1 application Nasal BID  . pantoprazole  40 mg Oral Daily  . rosuvastatin  5 mg Oral Daily  . tamsulosin  0.4 mg Oral QPC breakfast  . Vilazodone HCl  40 mg Oral Daily    LOS: 4 days   Time spent: 35 minutes  Irwin Brakeman, MD [facility]  If 7PM-7AM, please contact night-coverage [email or web address] 08/23/2020, 11:49 AM

## 2020-08-23 NOTE — Progress Notes (Signed)
   08/23/20 1630  Vitals  BP (!) 101/39  MAP (mmHg) (!) 53    Notified Dr. Vassie Loll of BP. No new orders received. Notify MD if SBP <90.

## 2020-08-23 NOTE — Progress Notes (Signed)
eLink Physician-Brief Progress Note Patient Name: Cody Hale DOB: [birth year] MRN: [id number]   Date of Service  08/23/2020  HPI/Events of Note  ABG on 50%/PRVC 24/TV 560/P 5 = 7.602/32.9/63.3.  eICU Interventions  Plan: 1. Decrease PRVC rate to 16. 2. Repeat ABG at 7:30 AM.     Intervention Category Major Interventions: Acid-Base disturbance - evaluation and management;Respiratory failure - evaluation and management  Tasheba Henson Dennard Nip 08/23/2020, 5:11 AM

## 2020-08-23 NOTE — Progress Notes (Signed)
Progress Note  Patient Name: Cody Hale Date of Encounter: 08/23/2020  Primary Cardiologist: Kate Sable, MD (Inactive) --> Needs to switch to new MD  Subjective   Intubated in ICU discussed care with nurse, CCM and primary service   Inpatient Medications    Scheduled Meds: . chlorhexidine gluconate (MEDLINE KIT)  15 mL Mouth Rinse BID  . Chlorhexidine Gluconate Cloth  6 each Topical Daily  . enoxaparin (LOVENOX) injection  110 mg Subcutaneous BID  . furosemide  80 mg Intravenous BID  . insulin aspart  0-15 Units Subcutaneous TID WC  . insulin aspart  0-5 Units Subcutaneous QHS  . insulin glargine  20 Units Subcutaneous Daily  . ipratropium-albuterol  3 mL Nebulization TID  . loratadine  10 mg Oral Daily  . mouth rinse  15 mL Mouth Rinse 10 times per day  . metoCLOPramide  5 mg Oral TID AC  . mupirocin ointment  1 application Nasal BID  . pantoprazole  40 mg Oral Daily  . rosuvastatin  5 mg Oral Daily  . tamsulosin  0.4 mg Oral QPC breakfast  . Vilazodone HCl  40 mg Oral Daily   Continuous Infusions: . propofol (DIPRIVAN) infusion 39.927 mcg/kg/min (08/23/20 0810)   PRN Meds: acetaminophen, ipratropium-albuterol, ondansetron **OR** ondansetron (ZOFRAN) IV   Vital Signs    Vitals:   08/23/20 0700 08/23/20 0757 08/23/20 0800 08/23/20 0811  BP: (!) 103/51  (!) 106/42   Pulse: (!) 41  (!) 43 61  Resp: (!) 25 16 20 17   Temp:    99.1 F (37.3 C)  TempSrc:    Axillary  SpO2: 99% 99% 99% 99%  Weight:      Height:        Intake/Output Summary (Last 24 hours) at 08/23/2020 0847 Last data filed at 08/23/2020 0810 Gross per 24 hour  Intake 338.55 ml  Output 640 ml  Net -301.45 ml    Last 3 Weights 08/23/2020 08/22/2020 08/21/2020  Weight (lbs) 234 lb 5.6 oz 242 lb 15.2 oz 239 lb 13.8 oz  Weight (kg) 106.3 kg 110.2 kg 108.8 kg  Some encounter information is confidential and restricted. Go to Review Flowsheets activity to see all data.      Telemetry     Slow afib with ventricular PVCls 08/23/2020   ECG    Atrial fibrillation, HR 77 with known LBBB.  - Personally Reviewed  Physical Exam   Intubated Sedated  SEM Basilar crackles  Plus 1 edema bilaterally Foley   Labs    Chemistry Recent Labs  Lab 08/31/2020 0811 08/20/20 0501 08/21/20 0410 08/22/20 0349 08/23/20 0518  NA 140   < > 142 139 139  K 4.2   < > 5.1 5.4* 4.2  CL 97*   < > 97* 95* 96*  CO2 35*   < > 33* 31 29  GLUCOSE 111*   < > 93 112* 76  BUN 18   < > 21 32* 50*  CREATININE 0.80   < > 1.07 1.46* 1.62*  CALCIUM 8.7*   < > 9.1 9.1 8.9  PROT 6.7  --   --   --   --   ALBUMIN 3.5  --   --   --   --   AST 17  --   --   --   --   ALT 13  --   --   --   --   ALKPHOS 83  --   --   --   --  BILITOT 1.1  --   --   --   --   GFRNONAA >60   < > >60 52* 46*  ANIONGAP 8   < > 12 13 14    < > = values in this interval not displayed.     Hematology Recent Labs  Lab 09/02/2020 0811 08/21/20 0410 08/23/20 0518  WBC 3.4* 5.1 5.7  RBC 4.72 4.70 4.33  HGB 14.0 13.8 12.5*  HCT 44.7 45.9 41.9  MCV 94.7 97.7 96.8  MCH 29.7 29.4 28.9  MCHC 31.3 30.1 29.8*  RDW 16.3* 16.0* 15.9*  PLT 113* 127* 109*    Cardiac EnzymesNo results for input(s): TROPONINI in the last 168 hours. No results for input(s): TROPIPOC in the last 168 hours.   BNP Recent Labs  Lab 08/10/2020 0811  BNP 386.0*     DDimer No results for input(s): DDIMER in the last 168 hours.   Radiology    DG Chest 1 View  Result Date: 08/21/2020 CLINICAL DATA:  Tachycardia. EXAM: CHEST  1 VIEW COMPARISON:  August 19, 2020. FINDINGS: Stable cardiomegaly. Sternotomy wires are noted. No pneumothorax is noted. Old bilateral rib fractures are noted increased bibasilar opacities are noted concerning for worsening edema or atelectasis with associated pleural effusions, left greater than right. IMPRESSION: Increased bibasilar opacities are noted concerning for worsening edema or atelectasis with associated pleural  effusions, left greater than right. Electronically Signed   By: Marijo Conception M.D.   On: 08/21/2020 13:58    Cardiac Studies   Echocardiogram: 08/20/2020 IMPRESSIONS     1. Left ventricular ejection fraction, by estimation, is 25 to 30%. The  left ventricle has severely decreased function. The left ventricle  demonstrates global hypokinesis. There is mild concentric left ventricular  hypertrophy. Left ventricular diastolic   parameters are indeterminate.   2. Right ventricular systolic function is moderately reduced. The right  ventricular size is mildly enlarged. There is severely elevated pulmonary  artery systolic pressure. The estimated right ventricular systolic  pressure is 25.6 mmHg.   3. Left atrial size was severely dilated.   4. The mitral valve has been repaired/replaced. No evidence of mitral  valve regurgitation. Moderate mitral stenosis with normal flow suggestived  by gradient assessment.   5. Large pleural effusion.   6. The aortic valve is tricuspid. There is mild calcification of the  aortic valve. There is moderate thickening of the aortic valve. Aortic  valve regurgitation is not visualized. No aortic stenosis is present.   7. The inferior vena cava is dilated in size with <50% respiratory  variability, suggesting right atrial pressure of 15 mmHg.   Comparison(s): A prior study was performed on 04/17/2020. No significant  change from prior study. Prior images reviewed side by side.   Patient Profile     70 y.o. 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   Assessment & Plan    1. Acute on Chronic  Combined Systolic and Diastolic CHF - BNP was only 386 EF 25-30% intubated for respiratory failure with hypercapnia yesterday  With CO2 > 100 CXR with   2. Chronic Atrial Fibrillation - rates  are slow with ectopy ok with degenerative MVR maximize diastolic filling time cover with heparin   3. Bioprosthetic Mitral Valve - S/p surgical repair in 09/2012. Echo this admission shows moderate stenosis with no evidence of regurgitation.   4. HTN - stable   5. HLD - on statin when taking oral   6. Concern for Aspiration - not taking PO prior to intubation FTT NG tube and nutrition per tube CCM   For questions or updates, please contact [place] Please consult [email or web address] for contact info under Cardiology/STEMI.   Rozann Lesches , PA-C 8:47 AM 08/23/2020 Pager: [phone]
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
## 2020-08-23 NOTE — Progress Notes (Signed)
Daily Progress Note   Patient Name: Cody Hale       Date: 08/23/2020 DOB: 1950-09-03  Age: 70 y.o. MRN#: [id number] Attending Physician: Murlean Iba, MD Primary Care Physician: Rosaria Ferries, MD Admit Date: 08/28/2020  Reason for Consultation/Follow-up: Establishing goals of care  Subjective: Noted patient's respiratory status decreased and he was intubated overnight. Cr is trending up. Patient currently sedated. No family at bedside.   Review of Systems  Unable to perform ROS: Intubated    Length of Stay: 4  Current Medications: Scheduled Meds:  . budesonide (PULMICORT) nebulizer solution  0.5 mg Nebulization BID  . chlorhexidine gluconate (MEDLINE KIT)  15 mL Mouth Rinse BID  . Chlorhexidine Gluconate Cloth  6 each Topical Daily  . enoxaparin (LOVENOX) injection  110 mg Subcutaneous BID  . furosemide  80 mg Intravenous BID  . insulin aspart  0-15 Units Subcutaneous TID WC  . insulin aspart  0-5 Units Subcutaneous QHS  . insulin glargine  20 Units Subcutaneous Daily  . ipratropium-albuterol  3 mL Nebulization TID  . loratadine  10 mg Oral Daily  . mouth rinse  15 mL Mouth Rinse 10 times per day  . metoCLOPramide  5 mg Oral TID AC  . mupirocin ointment  1 application Nasal BID  . pantoprazole  40 mg Oral Daily  . rosuvastatin  5 mg Oral Daily  . tamsulosin  0.4 mg Oral QPC breakfast  . Vilazodone HCl  40 mg Oral Daily    Continuous Infusions: . milrinone 0.25 mcg/kg/min (08/23/20 1606)  . propofol (DIPRIVAN) infusion 30 mcg/kg/min (08/23/20 1702)    PRN Meds: acetaminophen, fentaNYL (SUBLIMAZE) injection, ipratropium-albuterol, ondansetron **OR** ondansetron (ZOFRAN) IV  Physical Exam Vitals and nursing note reviewed.  Pulmonary:     Comments:  Intubated on full support Neurological:     Comments: sedated             Vital Signs: BP (!) 118/53   Pulse 81   Temp 97.7 F (36.5 C) (Axillary)   Resp 15   Ht 5' 9"  (1.753 m)   Wt 106.3 kg   SpO2 95%   BMI 34.61 kg/m  SpO2: SpO2: 95 % O2 Device: O2 Device: Ventilator O2 Flow Rate: O2 Flow Rate (L/min): 5.2 L/min  Intake/output summary:   Intake/Output Summary (Last 24  hours) at 08/23/2020 1702 Last data filed at 08/23/2020 1606 Gross per 24 hour  Intake 485.68 ml  Output 2140 ml  Net -1654.32 ml   LBM: Last BM Date: 08/20/20 Baseline Weight: Weight: 107.5 kg Most recent weight: Weight: 106.3 kg       Palliative Assessment/Data: PPS: 10%      Patient Active Problem List   Diagnosis Date Noted  . Acute hypoxemic respiratory failure ([place])   . Acute on chronic respiratory failure ([place]) 09/02/2020  . Acute on chronic respiratory failure with hypoxia ([place])   . Severe sepsis ([place])   . Acute on chronic combined systolic and diastolic CHF (congestive heart failure) ([place])   . Chronic atrial fibrillation ([place])   . Sleep apnea   . COPD, severe ([place])   . Endotracheally intubated   . Palliative care by specialist   . Streptococcus G Bacteremia 03/19/2020  . Community acquired pneumonia 03/16/2020  . Sepsis due to pneumonia 03/16/2020  . Need for Tdap vaccination 03/02/2020  . Need for influenza vaccination 03/02/2020  . Iron deficiency anemia due to chronic blood loss 03/02/2020  . Deficiency anemia 03/01/2020  . Allergic rhinitis due to pollen 03/01/2020  . NSVT (nonsustained ventricular tachycardia) ([place])   . COVID-19   . COPD with acute exacerbation ([place]) 01/13/2019  . Chronic respiratory failure with hypoxia ([place])   . Major depressive disorder, recurrent severe without psychotic features ([place]) 05/19/2018  . GERD with esophagitis 04/22/2018  . Nonischemic cardiomyopathy ([place]) 03/23/2018  . Therapeutic opioid-induced constipation (OIC) 12/04/2017  . Venous  stasis dermatitis of both lower extremities 07/31/2017  . Unilateral primary osteoarthritis, right hip 04/22/2017  . 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 Seasonal allergic rhinitis due to pollen 03/13/2016  . Chronic atrial fibrillation 01/11/2015  . S/P mitral valve replacement with bioprosthetic valve 01/11/2015  . Depression with somatization 11/22/2014  . Primary osteoarthritis of both knees 11/22/2014  . COPD (chronic obstructive pulmonary disease) with chronic bronchitis ([place]) 05/12/2014  . B12 deficiency anemia 02/03/2014  . Goals of care, counseling/discussion 03/21/2013  . Hyperlipidemia with target LDL less than 70 03/19/2013  . Chronic systolic CHF (congestive heart failure) ([place]) 02/17/2013  . Essential hypertension, malignant 12/01/2012  . Type II diabetes mellitus with manifestations ([place])   . Gastroparesis due to DM ([place]) 11/30/2012  . OSA (obstructive sleep apnea) 11/16/2012  . Endocarditis-resolved 08/26/2012  . Class 2 severe obesity due to excess calories with serious comorbidity and body mass index (BMI) of 35.0 to 35.9 in adult [facility]) 06/27/2012    Palliative Care Assessment & Plan   Patient Profile: 70 y.o. male  with past medical history of CHF (most recent ECHO shows EF 25-30%), mitral valve replacement, a fib, COPD, DM2, HTN, T-11/L4 fusion with wound dehiscence, history of dysphagia with PEG placed 05/2019 and was removed 11/2019 admitted on 08/17/2020 with hypoxia and lower extremity swelling. Workup reveals CHF exacerbation with pleural effusion. Hypercarbic today and transferred to stepdown and placed on bipap. He has been reporting that he doesn't feel well and has poor po intake. There are concerns he is not responding to IV diuresis. Palliative medicine consulted for goals of care.     Assessment/Recommendations/Plan   No family at bedside- called daughter Cody Hale- no answer  Continue current scope of care  PMT will follow  Goals of Care and  Additional Recommendations:  Limitations on Scope of Treatment: Full Scope Treatment  Code Status:  Full code  Prognosis:  Unable to determine  Discharge Planning:  To Be Determined  Thank you for allowing the Palliative Medicine Team to assist in the care of this patient.   Total time: 16 mins Greater than 50%  of this time was spent counseling and coordinating care related to the above assessment and plan.  Mariana Kaufman, AGNP-C Palliative Medicine   Please contact Palliative Medicine Team phone at [phone] for questions and concerns.

## 2020-08-23 NOTE — Progress Notes (Signed)
Initial Nutrition Assessment  DOCUMENTATION CODES:   Obesity unspecified  INTERVENTION:  Initiate Vital 1.2 @ 25 ml/hr, advance 10 ml every 6 hours as tolerated to goal rate 45 ml hr (1080 ml/day)  90 ml Prosource TF TID  MVI with mineral daily via tube  Provides 1536 kcal (103% of kcal needs), 147 grams of protein (101% of protein needs), and 875 ml free water  Tube feeding at goal  with propofol at current rate provides a total of 2059 kcal/day   Additional free water per CCM  Monitor magnesium, potassium, and phosphorus daily for at least 3 days, MD to replete as needed, as pt is at risk for refeeding syndrome given very poor po intake reported since admission  NUTRITION DIAGNOSIS:   Inadequate oral intake related to inability to eat as evidenced by NPO status.   GOAL:   Provide needs based on ASPEN/SCCM guidelines  MONITOR:   TF tolerance,I & O's,Skin,Labs,Vent status,Weight trends,Diet advancement  REASON FOR ASSESSMENT:   Consult Enteral/tube feeding initiation and management  ASSESSMENT:  70 year old male admitted for acute respiratory failure in the setting of acute on chronic combined CHF. Past medical history of A-fib on chronic anticoagulation, respiratory failure with hypoxia, cellulitis, COPD, DM2, Gastroparesis, HTN, left bundle branch block, prosthetic mitral valve, OSA. Pt presented from [facility] nursing facility with hypoxia and difficulty breathing.  Admit 2/12  Pt had increasing CO2 and poor response to BiPAP therapy and subsequently intubated due to altered mentation on 2/15  Discussed pt with nurse, reports pt refusing po intake per nursing staff while on floor.  Meal intake 15% x 1 documented meal on 2/14  Weight history reviewed, fluctuated ~20 lbs over the last 6 months. Suspect this is due to fluid shifts given chronic combined CHF. Deep pitting BLE edema noted.   08/23/20 106.3 kg  03/30/20 100.9 kg  03/01/20 110.7 kg  02/17/20 102.1  kg  02/10/20 108.9 kg   I/O: -983.5 ml since admit UOP: 600 ml x 24 hrs  Patient is currently sedated and intubated on ventilator support MV: 8.8 L/min Temp (24hrs), Avg:98.9 F (37.2 C), Min:97.7 F (36.5 C), Max:100.5 F (38.1 C) BP: 101/39 (cuff) MAP: 53 (cuff)  Propofol: 19.8 ml/hr (523 kcal)  Medications reviewed and include: Lasix IV 80 mg 2 times daily, SSI, Reglan, Protonix, Vilazodone HCl, Primacor  Labs: CBGs 91,95,85, BUN 50 (H), Cr 1.62 (H) K/Mg - WNL  A1c 6.6 on 08/20/19 (fairly well controlled)  NUTRITION - FOCUSED PHYSICAL EXAM: Mild orbital fat depletion; Mild temple muscle depletion   Diet Order:   Diet Order            Diet NPO time specified  Diet effective now                 EDUCATION NEEDS:   Not appropriate for education at this time  Skin:  Skin Assessment: Reviewed RN Assessment  Last BM:  2/16 type 6  Height:   Ht Readings from Last 1 Encounters:  08/22/20 5\' 9"  (1.753 m)    Weight:   Wt Readings from Last 1 Encounters:  08/23/20 106.3 kg    BMI:  Body mass index is 34.61 kg/m.  Estimated Nutritional Needs:   Kcal:  09/04/2020  Protein:  145  Fluid:  >1.2 L   [id number], RD, LDN Clinical Nutrition After Hours/Weekend Pager # in Amion

## 2020-08-24 ENCOUNTER — Inpatient Hospital Stay (HOSPITAL_COMMUNITY): Payer: Medicare Other

## 2020-08-24 DIAGNOSIS — Z7189 Other specified counseling: Secondary | ICD-10-CM

## 2020-08-24 DIAGNOSIS — J9622 Acute and chronic respiratory failure with hypercapnia: Secondary | ICD-10-CM | POA: Diagnosis not present

## 2020-08-24 DIAGNOSIS — J9601 Acute respiratory failure with hypoxia: Secondary | ICD-10-CM | POA: Diagnosis not present

## 2020-08-24 DIAGNOSIS — J9621 Acute and chronic respiratory failure with hypoxia: Secondary | ICD-10-CM | POA: Diagnosis not present

## 2020-08-24 DIAGNOSIS — I5043 Acute on chronic combined systolic (congestive) and diastolic (congestive) heart failure: Secondary | ICD-10-CM | POA: Diagnosis not present

## 2020-08-24 DIAGNOSIS — G4733 Obstructive sleep apnea (adult) (pediatric): Secondary | ICD-10-CM

## 2020-08-24 DIAGNOSIS — I482 Chronic atrial fibrillation, unspecified: Secondary | ICD-10-CM | POA: Diagnosis not present

## 2020-08-24 DIAGNOSIS — J449 Chronic obstructive pulmonary disease, unspecified: Secondary | ICD-10-CM

## 2020-08-24 DIAGNOSIS — Z515 Encounter for palliative care: Secondary | ICD-10-CM | POA: Diagnosis not present

## 2020-08-24 DIAGNOSIS — K2100 Gastro-esophageal reflux disease with esophagitis, without bleeding: Secondary | ICD-10-CM

## 2020-08-24 LAB — MAGNESIUM: Magnesium: 2.2 mg/dL (ref 1.7–2.4)

## 2020-08-24 LAB — BASIC METABOLIC PANEL
Anion gap: 13 (ref 5–15)
BUN: 51 mg/dL — ABNORMAL HIGH (ref 8–23)
CO2: 36 mmol/L — ABNORMAL HIGH (ref 22–32)
Calcium: 9.1 mg/dL (ref 8.9–10.3)
Chloride: 93 mmol/L — ABNORMAL LOW (ref 98–111)
Creatinine, Ser: 1.22 mg/dL (ref 0.61–1.24)
GFR, Estimated: >60 mL/min (ref >60)
Glucose, Bld: 118 mg/dL — ABNORMAL HIGH (ref 70–99)
Potassium: 3.2 mmol/L — ABNORMAL LOW (ref 3.5–5.1)
Sodium: 142 mmol/L (ref 135–145)

## 2020-08-24 LAB — GLUCOSE, CAPILLARY
Glucose-Capillary: 120 mg/dL — ABNORMAL HIGH (ref 70–99)
Glucose-Capillary: 133 mg/dL — ABNORMAL HIGH (ref 70–99)
Glucose-Capillary: 135 mg/dL — ABNORMAL HIGH (ref 70–99)
Glucose-Capillary: 90 mg/dL (ref 70–99)

## 2020-08-24 LAB — CULTURE, BLOOD (ROUTINE X 2)
Culture: NO GROWTH
Special Requests: ADEQUATE

## 2020-08-24 LAB — PHOSPHORUS: Phosphorus: 2.9 mg/dL (ref 2.5–4.6)

## 2020-08-24 MED ORDER — SODIUM CHLORIDE 0.9 % IV SOLN
2.0000 g | Freq: Two times a day (BID) | INTRAVENOUS | Status: DC
  Administered 2020-08-24: 2 g via INTRAVENOUS
  Filled 2020-08-24: qty 2

## 2020-08-24 MED ORDER — PROSOURCE TF PO LIQD: 45.0000 mL | Freq: Two times a day (BID) | ORAL | Status: DC

## 2020-08-24 MED ORDER — POTASSIUM CHLORIDE 10 MEQ/100ML IV SOLN
10.0000 meq | INTRAVENOUS | Status: AC
  Administered 2020-08-24 (×4): 10 meq via INTRAVENOUS
  Filled 2020-08-24 (×4): qty 100

## 2020-08-24 MED ORDER — VITAL HIGH PROTEIN PO LIQD: 1000.0000 mL | ORAL | Status: DC

## 2020-08-24 MED ORDER — MORPHINE SULFATE (PF) 4 MG/ML IV SOLN: 4.0000 mg | INTRAVENOUS | Status: DC | PRN

## 2020-08-24 MED ORDER — VANCOMYCIN HCL 2000 MG/400ML IV SOLN
2000.0000 mg | INTRAVENOUS | Status: DC
  Administered 2020-08-24: 2000 mg via INTRAVENOUS
  Filled 2020-08-24: qty 400

## 2020-08-24 NOTE — Progress Notes (Signed)
Contacted patient's daughter, Thayer Ohm to schedule a meeting with family to discuss POC. Thayer Ohm stated she did not want to come to the hospital for a meeting but would be willing to have a phone conference. She stated she would contact her brother Mellody Dance and they would be available at 2:00 pm. Dr. Laural Benes and Palliative care notified.

## 2020-08-24 NOTE — Progress Notes (Signed)
Progress Note  Patient Name: Cody Hale Date of Encounter: 08/24/2020  [facility] Cardiologist: Kate Sable, MD (Inactive) --> Needs to switch to new MD  Subjective   Intubated and sedated. Unable to contribute to history.   Inpatient Medications    Scheduled Meds: . budesonide (PULMICORT) nebulizer solution  0.5 mg Nebulization BID  . chlorhexidine gluconate (MEDLINE KIT)  15 mL Mouth Rinse BID  . Chlorhexidine Gluconate Cloth  6 each Topical Daily  . enoxaparin (LOVENOX) injection  110 mg Subcutaneous BID  . feeding supplement (PROSource TF)  90 mL Per Tube TID  . furosemide  80 mg Intravenous BID  . insulin aspart  0-15 Units Subcutaneous TID WC  . insulin aspart  0-5 Units Subcutaneous QHS  . insulin glargine  20 Units Subcutaneous Daily  . ipratropium-albuterol  3 mL Nebulization TID  . loratadine  10 mg Oral Daily  . mouth rinse  15 mL Mouth Rinse 10 times per day  . metoCLOPramide  5 mg Oral TID AC  . mupirocin ointment  1 application Nasal BID  . pantoprazole  40 mg Oral Daily  . rosuvastatin  5 mg Oral Daily  . tamsulosin  0.4 mg Oral QPC breakfast  . Vilazodone HCl  40 mg Oral Daily   Continuous Infusions: . feeding supplement (VITAL AF 1.2 CAL) 35 mL/hr at 08/24/20 0721  . milrinone 0.25 mcg/kg/min (08/24/20 1002)  . potassium chloride 10 mEq (08/24/20 1012)  . propofol (DIPRIVAN) infusion 30 mcg/kg/min (08/24/20 1002)   PRN Meds: acetaminophen, fentaNYL (SUBLIMAZE) injection, ipratropium-albuterol, ondansetron **OR** ondansetron (ZOFRAN) IV   Vital Signs    Vitals:   08/24/20 0821 08/24/20 0900 08/24/20 0936 08/24/20 1000  BP:  (!) 173/131 111/90 124/90  Pulse: (!) 44 74 (!) 54 (!) 55  Resp: 16 16 17 18   Temp: 99.7 F (37.6 C)     TempSrc: Axillary     SpO2: 100% 99% 98% 99%  Weight:      Height:        Intake/Output Summary (Last 24 hours) at 08/24/2020 1022 Last data filed at 08/24/2020 1002 Gross per 24 hour  Intake 762.81 ml   Output 4375 ml  Net -3612.19 ml   Last 3 Weights 08/23/2020 08/22/2020 08/21/2020  Weight (lbs) 234 lb 5.6 oz 242 lb 15.2 oz 239 lb 13.8 oz  Weight (kg) 106.3 kg 110.2 kg 108.8 kg  Some encounter information is confidential and restricted. Go to Review Flowsheets activity to see all data.      Telemetry    Atrial fibrillation, HR in 80's to 90's. Frequent ectopy with PVC's and episodes of ventricular bigeminy.  - Personally Reviewed  ECG    No new tracings.   Physical Exam   GEN: Resting in bed. Sedated.   Neck: JVD elevated to 10 cm.  Cardiac: Irregularly irregular, 2/6 SEM along Apex. Respiratory: Rales along bases. Intubated.  GI: Soft, nontender, non-distended  MS: 2+ pitting edema bilaterally; No deformity. Neuro:  Nonfocal  Psych: Normal affect   Labs    High Sensitivity Troponin:   Recent Labs  Lab 08/21/20 1346 08/21/20 1821  TROPONINIHS 44* 39*      Chemistry Recent Labs  Lab 08/15/2020 0811 08/20/20 0501 08/22/20 0349 08/23/20 0518 08/24/20 0517  NA 140   < > 139 139 142  K 4.2   < > 5.4* 4.2 3.2*  CL 97*   < > 95* 96* 93*  CO2 35*   < > 31 29  36*  GLUCOSE 111*   < > 112* 76 118*  BUN 18   < > 32* 50* 51*  CREATININE 0.80   < > 1.46* 1.62* 1.22  CALCIUM 8.7*   < > 9.1 8.9 9.1  PROT 6.7  --   --   --   --   ALBUMIN 3.5  --   --   --   --   AST 17  --   --   --   --   ALT 13  --   --   --   --   ALKPHOS 83  --   --   --   --   BILITOT 1.1  --   --   --   --   GFRNONAA >60   < > 52* 46* >60  ANIONGAP 8   < > 13 14 13    < > = values in this interval not displayed.     Hematology Recent Labs  Lab 08/27/2020 0811 08/21/20 0410 08/23/20 0518  WBC 3.4* 5.1 5.7  RBC 4.72 4.70 4.33  HGB 14.0 13.8 12.5*  HCT 44.7 45.9 41.9  MCV 94.7 97.7 96.8  MCH 29.7 29.4 28.9  MCHC 31.3 30.1 29.8*  RDW 16.3* 16.0* 15.9*  PLT 113* 127* 109*    BNP Recent Labs  Lab 08/21/2020 0811  BNP 386.0*     DDimer No results for input(s): DDIMER in the last 168  hours.   Radiology    DG Chest 1 View  Result Date: 08/23/2020 CLINICAL DATA:  Respiratory failure. EXAM: CHEST  1 VIEW COMPARISON:  08/22/2020. FINDINGS: Endotracheal tube in stable position. Prior cardiac valve replacement. Cardiomegaly with pulmonary venous congestion again noted. Bilateral interstitial prominence consistent interstitial edema again noted. Left base atelectasis. Bilateral pleural effusions left side greater than right again noted. Similar findings noted on prior exam. Old bilateral rib fractures again noted. Prior thoracolumbar spine fusion. IMPRESSION: 1.  Endotracheal tube in stable position. 2. Prior cardiac valve replacement. Cardiomegaly with pulmonary venous congestion. Bilateral interstitial prominence most consistent with interstitial edema again noted. Left base atelectasis. Bilateral pleural effusions left side greater than right again noted. Chest is unchanged from prior exam. Electronically Signed   By: Marcello Moores  Register   On: 08/23/2020 06:31   DG CHEST PORT 1 VIEW  Result Date: 08/22/2020 CLINICAL DATA:  Intubated EXAM: PORTABLE CHEST 1 VIEW COMPARISON:  08/21/2020, CT chest 08/17/2020, radiograph 04/26/2020 FINDINGS: Endotracheal tube tip is about 4 cm superior to the carina. There are post sternotomy changes. Large left pleural effusion and moderate right pleural effusion, appears increased on the left side. Cardiomegaly with vascular congestion and pulmonary edema. Left greater than right basilar airspace disease. Aortic atherosclerosis. No pneumothorax. Bilateral rib fractures appear chronic. IMPRESSION: 1. Endotracheal tube tip about 4 cm superior to the carina. 2. Cardiomegaly with vascular congestion and pulmonary edema. Large left and moderate right pleural effusions with left greater than right basilar airspace disease. Effusion on the left is increased compared to prior. Electronically Signed   By: Donavan Foil M.D.   On: 08/22/2020 20:13   DG [facility] Dys Tube [facility]c  W/Fl W/Rad  Result Date: 08/23/2020 CLINICAL DATA:  Nursing and position unable to pass nasogastric tube in ICU EXAM: NASO G TUBE PLACEMENT WITH FL AND WITH RADIOLOGIST CONTRAST:  59m ISOVUE-300 IOPAMIDOL (ISOVUE-300) INJECTION 61% FLUOROSCOPY TIME:  Fluoroscopy Time:  2 minutes 30 seconds Radiation Exposure Index (if provided by the fluoroscopic device): 52.1 mGy Number  of Acquired Spot Images: 1 COMPARISON:  None FINDINGS: Standard 18 French nasogastric tube was placed via the RIGHT nostril using lubricant. Tube was placed into the proximal esophagus. Under fluoroscopic visualization, 2 was advanced into the stomach. Intragastric position confirmed with 20 cc of contrast via the tube. Tube was secured by nursing. IMPRESSION: Nasogastric tube placement as above Electronically Signed   By: Lavonia Dana M.D.   On: 08/23/2020 14:44    Cardiac Studies   Echocardiogram: 08/20/2020 IMPRESSIONS    1. Left ventricular ejection fraction, by estimation, is 25 to 30%. The  left ventricle has severely decreased function. The left ventricle  demonstrates global hypokinesis. There is mild concentric left ventricular  hypertrophy. Left ventricular diastolic  parameters are indeterminate.  2. Right ventricular systolic function is moderately reduced. The right  ventricular size is mildly enlarged. There is severely elevated pulmonary  artery systolic pressure. The estimated right ventricular systolic  pressure is 09.3 mmHg.  3. Left atrial size was severely dilated.  4. The mitral valve has been repaired/replaced. No evidence of mitral  valve regurgitation. Moderate mitral stenosis with normal flow suggestived  by gradient assessment.  5. Large pleural effusion.  6. The aortic valve is tricuspid. There is mild calcification of the  aortic valve. There is moderate thickening of the aortic valve. Aortic  valve regurgitation is not visualized. No aortic stenosis is present.  7. The inferior vena  cava is dilated in size with <50% respiratory  variability, suggesting right atrial pressure of 15 mmHg.   Comparison(s): A prior study was performed on 04/17/2020. No significant  change from prior study. Prior images reviewed side by side.   Patient Profile     70 y.o. 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   Assessment & Plan   1. Acute on Chronic Combined Systolic and Diastolic CHF - Presented reporting dyspnea on exertion, orthopnea and edema for the past 2 weeks and found to have an acute CHF exacerbation. Repeat echocardiogram this admission showed a known reduced EF of 25-30% with moderately reduced RV function.  - Currently receiving IV Lasix 64m BID with a recorded output of -4.8 L this admission (-3.8 L yesterday and creatinine improved to 1.22 this AM. Weight listed as declining by 8 lbs over the past 3 days but a weight has not yet been recorded for today. Would continue with IV Lasix at current dosing given his appropriate response. By review of Dr. NKyla Balzarinenotes, not felt to be a good candidate for inotropic support. Palliative Care is following as well.   2. Chronic Atrial Fibrillation/Ventricular Bigeminy - He was not on AV nodal blocking agents given prior pauses in 2020. He is having bigeminy on telemetry and K+ is at 3.2 this AM and Mg at 2.2. K+ supplementation has been ordered. Try to keep Mg ~ 2.0 and K+ ~ 4.0. - On Eliquis prior to admission. Currently held given NPO status and receiving full-dose Lovenox.   3. Bioprosthetic Mitral Valve - S/p surgical repair in 09/2012. Repeat echo on 2/13 showed moderate MS  with no evidence of MR.   4. HTN - BP  has been soft this morning, at 93/51 on most recent check. PTA Hydralazine is held. Continue with IV Lasix.   5. Acute Hypoxic Respiratory Failure - He developed worsening mentation on 2/15 and was found to have acute respiratory acidosis requiring BiPAP and ultimately intubation. Also a concern for aspiration when he was taking PO's this admission. Pulmonology/Critical Care following.     For questions or updates, please contact [place] Please consult [email or web address] for contact info under        Signed, Erma Heritage, PA-C  08/24/2020, 10:22 AM

## 2020-08-24 NOTE — Progress Notes (Signed)
Pharmacy Antibiotic Note  Cody Hale is a 70 y.o. male admitted on 08/14/2020 with pneumonia.  Pharmacy has been consulted for Vancomycin and cefepime dosing.  Plan: Vancomycin 2000mg   IV every 24 hours.  Goal trough 15-20 mcg/mL. Projected AUC 474 Cefepime 2gm IV q12h F/U cxs and clinical progress Monitor V/S, labs and levels as indicated  Height: 5\' 9"  (175.3 cm) Weight: 106.3 kg (234 lb 5.6 oz) IBW/kg (Calculated) : 70.7  Temp (24hrs), Avg:98.3 F (36.8 C), Min:97.7 F (36.5 C), Max:99.7 F (37.6 C)  Recent Labs  Lab 09/02/2020 0811 08/08/2020 0813 08/14/2020 1011 08/20/20 0501 08/21/20 0410 08/22/20 0349 08/23/20 0518 08/24/20 0517  WBC 3.4*  --   --   --  5.1  --  5.7  --   CREATININE 0.80  --   --  0.77 1.07 1.46* 1.62* 1.22  LATICACIDVEN  --  1.3 0.8  --   --   --   --   --     Estimated Creatinine Clearance: 68.6 mL/min (by C-G formula based on SCr of 1.22 mg/dL).   Normalized CrCl is 13mls/min  Allergies  Allergen Reactions  . Daptomycin Rash  . Lisinopril Cough  . Metformin And Related Diarrhea  . Tape Rash and Other (See Comments)    Adhesive Tape-Burn skin.    Antimicrobials this admission: Vancomycin 2/17 >>  Cefepime 2/17 >>   Dose adjustments this admission: prn  Microbiology results: 2/15 BCx: ngtd 2/15 MRSA PCR: positive  Thank you for allowing pharmacy to be a part of this patient's care.  3/15, BS 3/15, Elder Cyphers Clinical Pharmacist Pager [phone] 08/24/2020 11:23 AM

## 2020-08-24 NOTE — Progress Notes (Signed)
PROGRESS NOTE  Cody Hale  RSW:[id number] DOB: Jan 02, 1951 DOA: 08/13/2020 PCP: Rosaria Ferries, MD  Brief Narrative:  Per HPI: Cody Smithis a 70 y.o.malewith a history of atrial fibrillation chronic anticoagulation, chronic systolic and diastolic heart failure, COPD, type 2 diabetes, hypertension. Resident of high [place] facility. Patient was called to the facility due to hypoxia and difficulty breathing. Initially on EMS arrival he was found to have an oxygen saturation in the 80s. He was placed on 2 L nasal cannula. He did have some leg swellings for a couple weeks. His shortness of breath is worse with lying down flat and exertion. Improved with rest and sitting up in bed. No other palliating or provoking factors.  He had increasing CO2 and poor response to bipap therapy and subsequently intubated due to altered mentation on 2/15.    Assessment & Plan:   Active Problems:   OSA (obstructive sleep apnea)   Type II diabetes mellitus with manifestations (HCC)   COPD (chronic obstructive pulmonary disease) with chronic bronchitis (HCC)   GERD with esophagitis   Acute on chronic respiratory failure with hypoxia (HCC)   Acute on chronic combined systolic and diastolic CHF (congestive heart failure) (HCC)   Chronic atrial fibrillation (HCC)   Acute on chronic respiratory failure (HCC)   Acute hypoxemic respiratory failure (HCC)  Acute hypoxemic respiratory failure secondary to acute on chronic combined systolic and diastolic heart failure exacerbation -Continue on Lasix 80 mg IV BID management per cardiology team.  Milronone infusion started by PCCM.  -Continue to monitor daily weights and strict I's and O's.  -2D echocardiogram performed with similar results as before with LVEF 25-30% -Follow BMP -Appreciate ongoing Cardiology input -PCCM consulted for vent management.  -wean from vent as able   [facility] Weights   08/21/20 0500 08/22/20 1249 08/23/20 0334   Weight: 108.8 kg 110.2 kg 106.3 kg    Intake/Output Summary (Last 24 hours) at 08/24/2020 0915 Last data filed at 08/24/2020 0938 Gross per 24 hour  Intake 563.05 ml  Output 4375 ml  Net -3811.95 ml   Mild hyperkalemia - RESOLVED   Poor oral intake/Failure to thrive -NG placement under fluoro completed and tube feeding started.  -Palliative evaluation  AKI - resolved  -Continue to monitor on diuresis -Poor urine output noted  Atrial fibrillation-rate controlled -resumeEliquisfor anticoagulation -Continue telemetry monitoring  COPD -No signs of acute exacerbation -DuoNebs as needed  Type 2 diabetes -Currently without significant hyperglycemia -Continue SSI -Continue long-acting insulin with dose decrease  CBG (last 3)  Recent Labs    08/24/20 0016 08/24/20 0446 08/24/20 0819  GLUCAP 90 120* 135*    OSA -He remains intubated  Goals of care -Discussed with cardiology, pt not a candidate for advanced therapies given comorbidities, cardiology recommended palliative care.  I am reaching out to palliative care team for further input.    DVT prophylaxis:Eliquis Code Status:Full Family Communication:unable to reach any family member by telephone 2/16, reach out again if time permits Disposition Plan: Status is: Inpatient  Remains inpatient appropriate because:IV treatments appropriate due to intensity of illness or inability to take PO and Inpatient level of care appropriate due to severity of illness  Dispo: The patient is from:ALF Anticipated d/c is to:TBD Anticipated d/c date is: TBD Patient currently is not medically stable to d/c.  He is critically ill on ventilator.  Palliative consulted to address the goals of care.  Difficult to place patient No  Consultants:  Cardiology  Palliative care  Procedures:  See below  Antimicrobials:  None  Subjective: Patient remains  sedated on vent.   Objective: Vitals:   08/24/20 0754 08/24/20 0755 08/24/20 0800 08/24/20 0821  BP:   (!) 129/47   Pulse:   (!) 51 (!) 44  Resp:   13 16  Temp:    99.7 F (37.6 C)  TempSrc:    Axillary  SpO2: 100%  99% 100%  Weight:      Height:  5' 9"  (1.753 m)      Intake/Output Summary (Last 24 hours) at 08/24/2020 0915 Last data filed at 08/24/2020 2902 Gross per 24 hour  Intake 563.05 ml  Output 4375 ml  Net -3811.95 ml   Filed Weights   08/21/20 0500 08/22/20 1249 08/23/20 0334  Weight: 108.8 kg 110.2 kg 106.3 kg   Examination:  General exam: sedated on vent.  Appears calm and comfortable  Respiratory system: intubated coarse rhonchorous sounds  Cardiovascular system: normal S1 & S2 heard, RRR.  Gastrointestinal system: Abdomen is soft Central nervous system: sedated.  Extremities: No edema Skin: No significant lesions noted Psychiatry: sedated.   Data Reviewed: I have personally reviewed following labs and imaging studies  CBC: Recent Labs  Lab 08/24/2020 0811 08/21/20 0410 08/23/20 0518  WBC 3.4* 5.1 5.7  HGB 14.0 13.8 12.5*  HCT 44.7 45.9 41.9  MCV 94.7 97.7 96.8  PLT 113* 127* 111*   Basic Metabolic Panel: Recent Labs  Lab 08/20/20 0501 08/21/20 0410 08/22/20 0349 08/23/20 0518 08/23/20 1948 08/24/20 0517  NA 143 142 139 139  --  142  K 5.2* 5.1 5.4* 4.2  --  3.2*  CL 100 97* 95* 96*  --  93*  CO2 36* 33* 31 29  --  36*  GLUCOSE 107* 93 112* 76  --  118*  BUN 15 21 32* 50*  --  51*  CREATININE 0.77 1.07 1.46* 1.62*  --  1.22  CALCIUM 9.1 9.1 9.1 8.9  --  9.1  MG  --  2.2 2.4 2.4 2.3 2.2  PHOS  --   --   --   --  2.7 2.9   GFR: Estimated Creatinine Clearance: 68.6 mL/min (by C-G formula based on SCr of 1.22 mg/dL). Liver Function Tests: Recent Labs  Lab 08/24/2020 0811  AST 17  ALT 13  ALKPHOS 83  BILITOT 1.1  PROT 6.7  ALBUMIN 3.5   No results for input(s): LIPASE, AMYLASE in the last 168 hours. No results for input(s):  AMMONIA in the last 168 hours. Coagulation Profile: Recent Labs  Lab 08/27/2020 0811  INR 1.5*   Cardiac Enzymes: No results for input(s): CKTOTAL, CKMB, CKMBINDEX, TROPONINI in the last 168 hours. BNP (last 3 results) No results for input(s): PROBNP in the last 8760 hours. HbA1C: No results for input(s): HGBA1C in the last 72 hours. CBG: Recent Labs  Lab 08/23/20 1928 08/23/20 2042 08/24/20 0016 08/24/20 0446 08/24/20 0819  GLUCAP 77 83 90 120* 135*   Lipid Profile: Recent Labs    08/23/20 0518  TRIG 110   Thyroid Function Tests: No results for input(s): TSH, T4TOTAL, FREET4, T3FREE, THYROIDAB in the last 72 hours. Anemia Panel: No results for input(s): VITAMINB12, FOLATE, FERRITIN, TIBC, IRON, RETICCTPCT in the last 72 hours. Sepsis Labs: Recent Labs  Lab 08/14/2020 0813 08/24/2020 1011  LATICACIDVEN 1.3 0.8    Recent Results (from the past 240 hour(s))  Culture, blood (routine x 2)     Status: None (Preliminary result)   Collection  Time: 08/08/2020  8:18 AM   Specimen: BLOOD RIGHT FOREARM  Result Value Ref Range Status   Specimen Description BLOOD RIGHT FOREARM  Final   Special Requests   Final    BOTTLES DRAWN AEROBIC AND ANAEROBIC Blood Culture adequate volume   Culture   Final    NO GROWTH 4 DAYS Performed at [facility], [address], [place], [place] [address]    Report Status PENDING  Incomplete  Culture, blood (routine x 2)     Status: None   Collection Time: 08/18/2020  8:40 AM   Specimen: BLOOD RIGHT ARM  Result Value Ref Range Status   Specimen Description   Final    BLOOD RIGHT ARM Performed at [facility], [address] [place], [place] [address]    Special Requests   Final    BOTTLES DRAWN AEROBIC AND ANAEROBIC Blood Culture adequate volume Performed at [facility], [address] [place], [place] [address]    Culture   Final    NO GROWTH 5 DAYS Performed at [facility], [place] [address], [place], [place] [address]    Report Status  08/24/2020 FINAL  Final  SARS CORONAVIRUS 2 (TAT 6-24 HRS) Nasopharyngeal Nasopharyngeal Swab     Status: None   Collection Time: 08/29/2020  8:50 AM   Specimen: Nasopharyngeal Swab  Result Value Ref Range Status   SARS Coronavirus 2 NEGATIVE NEGATIVE Final    Comment: (NOTE) SARS-CoV-2 target nucleic acids are NOT DETECTED.  The SARS-CoV-2 RNA is generally detectable in upper and lower respiratory specimens during the acute phase of infection. Negative results do not preclude SARS-CoV-2 infection, do not rule out co-infections with other pathogens, and should not be used as the sole basis for treatment or other patient management decisions. Negative results must be combined with clinical observations, patient history, and epidemiological information. The expected result is Negative.  Fact Sheet for Patients: SugarRoll.be  Fact Sheet for Healthcare Providers: https://www.woods-mathews.com/  This test is not yet approved or cleared by the [place] FDA and  has been authorized for detection and/or diagnosis of SARS-CoV-2 by FDA under an Emergency Use Authorization (EUA). This EUA will remain  in effect (meaning this test can be used) for the duration of the COVID-19 declaration under Se ction 564(b)(1) of the Act, 21 U.S.C. section 360bbb-3(b)(1), unless the authorization is terminated or revoked sooner.  Performed at [facility], [place] [address], [place], [place] [address]   MRSA PCR Screening     Status: Abnormal   Collection Time: 08/22/20 12:55 PM   Specimen: Nasopharyngeal  Result Value Ref Range Status   MRSA by PCR POSITIVE (A) NEGATIVE Final    Comment:        The GeneXpert MRSA Assay (FDA approved for NASAL specimens only), is one component of a comprehensive MRSA colonization surveillance program. It is not intended to diagnose MRSA infection nor to guide or monitor treatment for MRSA infections. RESULT CALLED  TO, READ BACK BY AND VERIFIED WITH: DELLER,J @ 2113 ON 08/22/20 BY JUW Performed at [facility], [address] [place], [place] [address]   Culture, Respiratory w Gram Stain     Status: None (Preliminary result)   Collection Time: 08/23/20  9:16 AM   Specimen: Tracheal Aspirate; Respiratory  Result Value Ref Range Status   Specimen Description   Final    TRACHEAL ASPIRATE Performed at [facility], [address], [place], [place] [address]    Special Requests  Final    NONE Performed at [facility], [address], [place], [place] [address]    Gram Stain   Final    ABUNDANT WBC PRESENT, PREDOMINANTLY PMN ABUNDANT GRAM POSITIVE COCCI ABUNDANT GRAM NEGATIVE RODS FEW GRAM POSITIVE RODS    Culture   Final    CULTURE REINCUBATED FOR BETTER GROWTH Performed at [facility], [place] [address] [place], [place] [address]    Report Status PENDING  Incomplete   Radiology Studies: DG Chest 1 View  Result Date: 08/23/2020 CLINICAL DATA:  Respiratory failure. EXAM: CHEST  1 VIEW COMPARISON:  08/22/2020. FINDINGS: Endotracheal tube in stable position. Prior cardiac valve replacement. Cardiomegaly with pulmonary venous congestion again noted. Bilateral interstitial prominence consistent interstitial edema again noted. Left base atelectasis. Bilateral pleural effusions left side greater than right again noted. Similar findings noted on prior exam. Old bilateral rib fractures again noted. Prior thoracolumbar spine fusion. IMPRESSION: 1.  Endotracheal tube in stable position. 2. Prior cardiac valve replacement. Cardiomegaly with pulmonary venous congestion. Bilateral interstitial prominence most consistent with interstitial edema again noted. Left base atelectasis. Bilateral pleural effusions left side greater than right again noted. Chest is unchanged from prior exam. Electronically Signed   By: Marcello Moores  Register   On: 08/23/2020 06:31   DG CHEST PORT 1 VIEW  Result Date: 08/22/2020 CLINICAL  DATA:  Intubated EXAM: PORTABLE CHEST 1 VIEW COMPARISON:  08/21/2020, CT chest 08/29/2020, radiograph 04/26/2020 FINDINGS: Endotracheal tube tip is about 4 cm superior to the carina. There are post sternotomy changes. Large left pleural effusion and moderate right pleural effusion, appears increased on the left side. Cardiomegaly with vascular congestion and pulmonary edema. Left greater than right basilar airspace disease. Aortic atherosclerosis. No pneumothorax. Bilateral rib fractures appear chronic. IMPRESSION: 1. Endotracheal tube tip about 4 cm superior to the carina. 2. Cardiomegaly with vascular congestion and pulmonary edema. Large left and moderate right pleural effusions with left greater than right basilar airspace disease. Effusion on the left is increased compared to prior. Electronically Signed   By: Donavan Foil M.D.   On: 08/22/2020 20:13   DG Loyce Dys Tube Plc W/Fl W/Rad  Result Date: 08/23/2020 CLINICAL DATA:  Nursing and position unable to pass nasogastric tube in ICU EXAM: NASO G TUBE PLACEMENT WITH FL AND WITH RADIOLOGIST CONTRAST:  80m ISOVUE-300 IOPAMIDOL (ISOVUE-300) INJECTION 61% FLUOROSCOPY TIME:  Fluoroscopy Time:  2 minutes 30 seconds Radiation Exposure Index (if provided by the fluoroscopic device): 52.1 mGy Number of Acquired Spot Images: 1 COMPARISON:  None FINDINGS: Standard 18 French nasogastric tube was placed via the RIGHT nostril using lubricant. Tube was placed into the proximal esophagus. Under fluoroscopic visualization, 2 was advanced into the stomach. Intragastric position confirmed with 20 cc of contrast via the tube. Tube was secured by nursing. IMPRESSION: Nasogastric tube placement as above Electronically Signed   By: MLavonia DanaM.D.   On: 08/23/2020 14:44   Scheduled Meds: . budesonide (PULMICORT) nebulizer solution  0.5 mg Nebulization BID  . chlorhexidine gluconate (MEDLINE KIT)  15 mL Mouth Rinse BID  . Chlorhexidine Gluconate Cloth  6 each Topical Daily   . enoxaparin (LOVENOX) injection  110 mg Subcutaneous BID  . feeding supplement (PROSource TF)  90 mL Per Tube TID  . furosemide  80 mg Intravenous BID  . insulin aspart  0-15 Units Subcutaneous TID WC  . insulin aspart  0-5 Units Subcutaneous QHS  . insulin glargine  20 Units Subcutaneous Daily  . ipratropium-albuterol  3 mL Nebulization TID  . loratadine  10 mg Oral Daily  . mouth rinse  15 mL Mouth Rinse 10 times per day  . metoCLOPramide  5 mg Oral TID AC  . mupirocin ointment  1 application Nasal BID  . pantoprazole  40 mg Oral Daily  . rosuvastatin  5 mg Oral Daily  . tamsulosin  0.4 mg Oral QPC breakfast  . Vilazodone HCl  40 mg Oral Daily    LOS: 5 days   Critical Care Procedure Note Authorized and Performed by: Murvin Natal MD  Total Critical Care time:  40 mins  Due to a high probability of clinically significant, life threatening deterioration, the patient required my highest level of preparedness to intervene emergently and I personally spent this critical care time directly and personally managing the patient.  This critical care time included obtaining a history; examining the patient, pulse oximetry; ordering and review of studies; arranging urgent treatment with development of a management plan; evaluation of patient's response of treatment; frequent reassessment; and discussions with other providers.  This critical care time was performed to assess and manage the high probability of imminent and life threatening deterioration that could result in multi-organ failure.  It was exclusive of separately billable procedures and treating other patients and teaching time.    Irwin Brakeman, MD [facility]  If 7PM-7AM, please contact night-coverage [email or web address] 08/24/2020, 9:15 AM

## 2020-08-24 NOTE — Progress Notes (Signed)
NAME:  Torri Langston, MRN:  [id number], DOB:  [birth year], LOS: 5 ADMISSION DATE:  08/27/2020, CONSULTATION DATE:  08/24/2020  REFERRING MD:  Gwendolyn Lima, CHIEF COMPLAINT: Mechanical ventilation  Brief History:  70 year old NHR with HFrEF EF 25%, bioprosthetic mitral valve, chronic atrial fibrillation and COPD with acute hypercarbic respiratory failure requiring mechanical ventilation 2/15  History of Present Illness:  He is a nursing home resident .  EMS was called due to saturations 80% and placed on nasal cannula.  In the ED was given IV Lasix, CT angiogram was negative for PE and showed pulmonary edema.  He was admitted with a presumptive diagnosis of acute on chronic systolic heart failure.  Lasix was started at 40 and then increased to 80 mg, unfortunately did not have significant diuresis .  He was noted to have poor oral intake.  Palliative care was consulted.  He developed worsening mentation and ABG showed acute respiratory acidosis, he was placed on BiPAP but ABG was worse at 7.1 5/105/77 at which point he was intubated on 2/15.  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 Echo 2/13 EF 25 to 30%, global hypokinesis  Micro Data:   [facility] 2/12 >> ng Respiratory 2/16 >> GNR >>  Antimicrobials:    cfepime 2/17 >> vanc 2/17 >>  Interim History / Subjective:    Remains critically ill, intubated Sedated with propofol gtt Good UO overnight  Objective   Blood pressure (!) 113/30, pulse (!) 44, temperature 99.7 F (37.6 C), temperature source Axillary, resp. rate 16, height 5\' 9"  (1.753 m), weight 106.3 kg, SpO2 98 %.    Vent Mode: PRVC FiO2 (%):  [40 %] 40 % Set Rate:  [12 bmp] 12 bmp Vt Set:  [560 mL] 560 mL PEEP:  [5 cmH20] 5 cmH20 Pressure Support:  [12 cmH20] 12 cmH20 Plateau Pressure:  [19 cmH20-24 cmH20] 19 cmH20   Intake/Output Summary (Last 24 hours) at 08/24/2020 1121 Last data filed at 08/24/2020 1102 Gross per 24 hour  Intake 876.24 ml  Output 5000 ml  Net -4123.76 ml   Filed Weights   08/21/20 0500 08/22/20 1249 08/23/20 0334  Weight: 108.8 kg 110.2 kg 106.3 kg    Examination: General: Elderly man, orally intubated, sedated with propofol, no distress HENT: Mild pallor, no icterus, no JVD Lungs: decreased BS BL bases, no rhonchi, no accessory muscle use Cardiovascular: Midline sternotomy scar, s1s2 irregular, no murmur, PVCs + Abdomen: Soft nontender, no organomegaly Extremities: No deformity, 2+ peripheral edema Neuro: sedate , RASS-2 GU: Clear urine  Chest x-ray 2/16 independently reviewed which shows bilateral effusions, no new infiltrate, ET tube in position  Labs show hypokalemia , improved creatinine from 1.6-1.2  Resolved Hospital Problem list     Assessment & Plan:  Acute on chronic hypoxic/hypercarbic respiratory failure Related to pulmonary edema with underlying COPD Baseline is 1 L  nasal cannula daytime and BiPAP at night  -Daily wake-up assessment with spontaneous breathing trials -Ongoing palliative conversation about care limitations, Hopeful to extubate if current diuresis continues -Change to Precedex from propofol -Given thick secretions add empiric cefepime +  vanc for HAP while awaiting resp cx  COPD -no evidence of bronchospasm DuoNebs every 6, added Pulmicort  Acute on chronic systolic heart failure -  Started on milrinone 2/16 with brisk diuresis -We will continue for 48 hours. Continue Lasix 80 every 12 for negative balance   Chronic atrial fibrillation -Per cardiology, currently rate controlled  NG tube placed under fluoroscopy, tube feeds started  Best practice (evaluated daily)  Diet: Tube feeds  Pain/Anxiety/Delirium protocol (if indicated): Precedex/fentanyl, goal RASS zero to -1 VAP protocol (if indicated): Yes DVT prophylaxis: Therapeutic Lovenox GI prophylaxis: Protonix Glucose control: SSI Mobility: Bedrest Disposition: ICU  Goals of Care:  Last date of multidisciplinary goals of care discussion: 2/15 by palliative care Family and staff present: Daughter Summary of discussion: Full code Follow up goals of care discussion due: Per primary and palliative care Code Status: Full code  Labs   CBC: Recent Labs  Lab September 13, 2020 0811 08/21/20 0410 08/23/20 0518  WBC 3.4* 5.1 5.7  HGB 14.0 13.8 12.5*  HCT 44.7 45.9 41.9  MCV 94.7 97.7 96.8  PLT 113* 127* 109*    Basic Metabolic Panel: Recent Labs  Lab 08/20/20 0501 08/21/20 0410 08/22/20 0349 08/23/20 0518 08/23/20 1948 08/24/20 0517  NA 143 142 139 139  --  142  K 5.2* 5.1 5.4* 4.2  --  3.2*  CL 100 97* 95* 96*  --  93*  CO2 36* 33* 31 29  --  36*  GLUCOSE 107* 93 112* 76  --  118*  BUN 15 21 32* 50*  --  51*  CREATININE 0.77 1.07 1.46* 1.62*  --  1.22  CALCIUM 9.1 9.1 9.1 8.9  --  9.1  MG  --  2.2 2.4 2.4 2.3 2.2  PHOS  --   --   --   --  2.7 2.9   GFR: Estimated Creatinine Clearance: 68.6 mL/min (by C-G formula based on SCr of 1.22 mg/dL). Recent Labs  Lab 2020/09/13 0811 09/13/20 0813 09-13-20 1011 08/21/20 0410 08/23/20 0518  WBC 3.4*  --   --  5.1 5.7  LATICACIDVEN  --  1.3 0.8  --   --     Liver Function Tests: Recent Labs  Lab  09-13-20 0811  AST 17  ALT 13  ALKPHOS 83  BILITOT 1.1  PROT 6.7  ALBUMIN 3.5   No results for input(s): LIPASE, AMYLASE in the last 168 hours. No results for input(s): AMMONIA in the last 168 hours.  ABG    Component Value Date/Time   PHART 7.513 (H) 08/23/2020 0750   PCO2ART 43.4 08/23/2020 0750   PO2ART 115 (H) 08/23/2020 0750   HCO3 34.3 (H) 08/23/2020 0750   TCO2 30 05/27/2019 0902   ACIDBASEDEF 1.7 03/02/2018 2230   O2SAT 98.4 08/23/2020 0750     Coagulation Profile: Recent Labs  Lab September 13, 2020 0811  INR 1.5*     Critical care time: 31m     Cyril Mourning MD. FCCP.  Pulmonary & Critical care Pager : [phone]  If no response to pager , please call [phone] until 7 pm After 7:00 pm call Elink  [phone]   08/24/2020

## 2020-08-24 NOTE — Progress Notes (Addendum)
Daily Progress Note   Patient Name: Leven Hoel       Date: 08/24/2020 DOB: Mar 02, 1951  Age: 70 y.o. MRN#: [id number] Attending Physician: Murlean Iba, MD Primary Care Physician: Rosaria Ferries, MD Admit Date: 08/14/2020  Reason for Consultation/Follow-up: Establishing goals of care  Subjective: Conference call for [place] discussion had with Dr. Wynetta Emery and Estill Bamberg, RN in attendance and patient's son- [name], and daughter- [name] on speakerphone.  Patient's current illness state and prognosis for recovery were discussed. Currently with even minor decreases in sedation patient immediately becomes very uncomfortable. Patient has severe heart failure and COPD, cardiology notes not candidate for further interventions.  We discussed the progressive nature of heart failure and COPD.  After discussion and consideration of recommendations made by multiple care providers- decision made for patient to change code status to DNR.  Family is in agreement with focusing on comfort measures and extubation after all family have had a chance to visit.   Review of Systems  Unable to perform ROS: Intubated    Length of Stay: 5  Current Medications: Scheduled Meds:  . chlorhexidine gluconate (MEDLINE KIT)  15 mL Mouth Rinse BID  . Chlorhexidine Gluconate Cloth  6 each Topical Daily  . furosemide  80 mg Intravenous BID  . mouth rinse  15 mL Mouth Rinse 10 times per day  . mupirocin ointment  1 application Nasal BID    Continuous Infusions: . propofol (DIPRIVAN) infusion 20 mcg/kg/min (08/24/20 1453)    PRN Meds: acetaminophen, fentaNYL (SUBLIMAZE) injection, morphine injection, ondansetron **OR** ondansetron (ZOFRAN) IV  Physical Exam Vitals and nursing note reviewed.  Constitutional:       Comments: frail  Cardiovascular:     Rate and Rhythm: Rhythm irregular.  Pulmonary:     Comments: intubated Neurological:     Comments: sedated             Vital Signs: BP (!) 111/39   Pulse (!) 48   Temp 99.4 F (37.4 C) (Axillary)   Resp (!) 29   Ht 5' 9"  (1.753 m)   Wt 106.3 kg   SpO2 98%   BMI 34.61 kg/m  SpO2: SpO2: 98 % O2 Device: O2 Device: Ventilator O2 Flow Rate: O2 Flow Rate (L/min): 5.2 L/min  Intake/output summary:   Intake/Output Summary (Last 24 hours) at  08/24/2020 1523 Last data filed at 08/24/2020 1453 Gross per 24 hour  Intake 1484.85 ml  Output 4525 ml  Net -3040.15 ml   LBM: Last BM Date: 08/24/20 Baseline Weight: Weight: 107.5 kg Most recent weight: Weight: 106.3 kg       Palliative Assessment/Data: PPS: 10%      Patient Active Problem List   Diagnosis Date Noted  . Acute hypoxemic respiratory failure ([place])   . Acute and chronic respiratory failure with hypercapnia ([place]) 09/01/2020  . Acute on chronic respiratory failure with hypoxia ([place])   . Severe sepsis ([place])   . Acute on chronic combined systolic and diastolic CHF (congestive heart failure) ([place])   . Chronic atrial fibrillation ([place])   . Sleep apnea   . COPD, severe ([place])   . Endotracheally intubated   . Palliative care by specialist   . Streptococcus G Bacteremia 03/19/2020  . Community acquired pneumonia 03/16/2020  . Sepsis due to pneumonia 03/16/2020  . Need for Tdap vaccination 03/02/2020  . Need for influenza vaccination 03/02/2020  . Iron deficiency anemia due to chronic blood loss 03/02/2020  . Deficiency anemia 03/01/2020  . Allergic rhinitis due to pollen 03/01/2020  . NSVT (nonsustained ventricular tachycardia) ([place])   . COVID-19   . COPD with acute exacerbation ([place]) 01/13/2019  . Chronic respiratory failure with hypoxia ([place])   . Major depressive disorder, recurrent severe without psychotic features ([place]) 05/19/2018  . GERD with esophagitis 04/22/2018  .  Nonischemic cardiomyopathy ([place]) 03/23/2018  . Therapeutic opioid-induced constipation (OIC) 12/04/2017  . Venous stasis dermatitis of both lower extremities 07/31/2017  . Unilateral primary osteoarthritis, right hip 04/22/2017  . 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 Seasonal allergic rhinitis due to pollen 03/13/2016  . Chronic atrial fibrillation 01/11/2015  . S/P mitral valve replacement with bioprosthetic valve 01/11/2015  . Depression with somatization 11/22/2014  . Primary osteoarthritis of both knees 11/22/2014  . COPD (chronic obstructive pulmonary disease) with chronic bronchitis ([place]) 05/12/2014  . B12 deficiency anemia 02/03/2014  . Goals of care, counseling/discussion 03/21/2013  . Hyperlipidemia with target LDL less than 70 03/19/2013  . Chronic systolic CHF (congestive heart failure) ([place]) 02/17/2013  . Essential hypertension, malignant 12/01/2012  . Type II diabetes mellitus with manifestations ([place])   . Gastroparesis due to DM ([place]) 11/30/2012  . OSA (obstructive sleep apnea) 11/16/2012  . Endocarditis-resolved 08/26/2012  . Class 2 severe obesity due to excess calories with serious comorbidity and body mass index (BMI) of 35.0 to 35.9 in adult [facility]) 06/27/2012    Palliative Care Assessment & Plan   HPI: 70 y.o. male  with past medical history of CHF (most recent ECHO shows EF 25-30%), mitral valve replacement, a fib, COPD, DM2, HTN, T-11/L4 fusion with wound dehiscence, history of dysphagia with PEG placed 05/2019 and was removed 11/2019 admitted on 08/31/2020 with hypoxia and lower extremity swelling. Workup reveals CHF exacerbation with pleural effusion. Hypercarbic today and transferred to stepdown and placed on bipap. He has been reporting that he doesn't feel well and has poor po intake. There are concerns he is not responding to IV diuresis. Palliative medicine consulted for goals of care.     Assessment/Recommendations/Plan   DNR  Gentle transition  to comfort measures- no further labs, no tube feeding, no glucose checks, stop medications that are not making difference in trajectory, will add prn morphine for comfort  Family to call later today or tomorrow morning to discuss timing of extubation  Goals of Care and Additional Recommendations:  Limitations on Scope of Treatment: Full Comfort Care  Code Status:  DNR  Prognosis:   Hours - Days after extubation  Discharge Planning:  Anticipated Hospital Death  Care plan was discussed with patient's family and care team.   Thank you for allowing the Palliative Medicine Team to assist in the care of this patient.   Total time: 42 minutes Greater than 50%  of this time was spent counseling and coordinating care related to the above assessment and plan.  Mariana Kaufman, AGNP-C Palliative Medicine   Please contact Palliative Medicine Team phone at [phone] for questions and concerns.

## 2020-08-25 DIAGNOSIS — E118 Type 2 diabetes mellitus with unspecified complications: Secondary | ICD-10-CM

## 2020-08-25 DIAGNOSIS — J9601 Acute respiratory failure with hypoxia: Secondary | ICD-10-CM | POA: Diagnosis not present

## 2020-08-25 DIAGNOSIS — I10 Essential (primary) hypertension: Secondary | ICD-10-CM

## 2020-08-25 DIAGNOSIS — R54 Age-related physical debility: Secondary | ICD-10-CM

## 2020-08-25 DIAGNOSIS — L8990 Pressure ulcer of unspecified site, unspecified stage: Secondary | ICD-10-CM | POA: Insufficient documentation

## 2020-08-25 DIAGNOSIS — Z7189 Other specified counseling: Secondary | ICD-10-CM | POA: Diagnosis not present

## 2020-08-25 DIAGNOSIS — R609 Edema, unspecified: Secondary | ICD-10-CM | POA: Diagnosis not present

## 2020-08-25 DIAGNOSIS — I482 Chronic atrial fibrillation, unspecified: Secondary | ICD-10-CM | POA: Diagnosis not present

## 2020-08-25 DIAGNOSIS — R0902 Hypoxemia: Secondary | ICD-10-CM

## 2020-08-25 DIAGNOSIS — I5043 Acute on chronic combined systolic (congestive) and diastolic (congestive) heart failure: Secondary | ICD-10-CM | POA: Diagnosis not present

## 2020-08-25 DIAGNOSIS — R009 Unspecified abnormalities of heart beat: Secondary | ICD-10-CM

## 2020-08-25 DIAGNOSIS — Z515 Encounter for palliative care: Secondary | ICD-10-CM | POA: Diagnosis not present

## 2020-08-25 DIAGNOSIS — G9340 Encephalopathy, unspecified: Secondary | ICD-10-CM | POA: Diagnosis not present

## 2020-08-25 DIAGNOSIS — J9622 Acute and chronic respiratory failure with hypercapnia: Secondary | ICD-10-CM | POA: Diagnosis not present

## 2020-08-25 LAB — GLUCOSE, CAPILLARY
Glucose-Capillary: 99 mg/dL (ref 70–99)
Glucose-Capillary: 76 mg/dL (ref 70–99)

## 2020-08-25 LAB — CULTURE, BLOOD (ROUTINE X 2)
Culture: NO GROWTH
Special Requests: ADEQUATE

## 2020-08-25 LAB — CULTURE, RESPIRATORY W GRAM STAIN: Culture: NORMAL

## 2020-08-25 MED ORDER — MORPHINE BOLUS VIA INFUSION
5.0000 mg | INTRAVENOUS | Status: DC | PRN
  Administered 2020-08-25: 5 mg via INTRAVENOUS
  Filled 2020-08-25: qty 5

## 2020-08-25 MED ORDER — PROPOFOL BOLUS VIA INFUSION
2.0000 mg/kg | INTRAVENOUS | Status: DC | PRN
  Filled 2020-08-25: qty 213

## 2020-08-25 MED ORDER — MORPHINE SULFATE (PF) 2 MG/ML IV SOLN: 2.0000 mg | INTRAVENOUS | Status: DC | PRN

## 2020-08-25 MED ORDER — GLYCOPYRROLATE 0.2 MG/ML IJ SOLN
0.2000 mg | INTRAMUSCULAR | Status: AC
  Administered 2020-08-25: 0.2 mg via INTRAVENOUS
  Filled 2020-08-25: qty 1

## 2020-08-25 MED ORDER — GLYCOPYRROLATE 1 MG PO TABS: 1.0000 mg | ORAL_TABLET | ORAL | Status: DC | PRN

## 2020-08-25 MED ORDER — DIPHENHYDRAMINE HCL 50 MG/ML IJ SOLN: 25.0000 mg | INTRAMUSCULAR | Status: DC | PRN

## 2020-08-25 MED ORDER — MORPHINE 100MG IN NS 100ML (1MG/ML) PREMIX INFUSION
0.0000 mg/h | INTRAVENOUS | Status: DC
  Administered 2020-08-25: 5 mg/h via INTRAVENOUS
  Filled 2020-08-25: qty 100

## 2020-08-25 MED ORDER — POLYVINYL ALCOHOL 1.4 % OP SOLN: 1.0000 [drp] | Freq: Four times a day (QID) | OPHTHALMIC | Status: DC | PRN

## 2020-08-25 MED ORDER — GLYCOPYRROLATE 0.2 MG/ML IJ SOLN: 0.2000 mg | INTRAMUSCULAR | Status: DC | PRN

## 2020-08-25 MED ORDER — LORAZEPAM 2 MG/ML IJ SOLN: 2.0000 mg | INTRAMUSCULAR | Status: DC | PRN

## 2020-08-27 LAB — CULTURE, RESPIRATORY W GRAM STAIN

## 2020-09-05 NOTE — Progress Notes (Signed)
Progress Note  Patient Name: Cody Hale Date of Encounter: 28-Aug-2020  [facility] Cardiologist: Kate Sable, MD (Inactive) --> Needs to switch to new MD  Subjective   Seen on rounds this am Intubated Plans for terminal extubation no reintubation   Inpatient Medications    Scheduled Meds: . chlorhexidine gluconate (MEDLINE KIT)  15 mL Mouth Rinse BID  . Chlorhexidine Gluconate Cloth  6 each Topical Daily  . mouth rinse  15 mL Mouth Rinse 10 times per day  . mupirocin ointment  1 application Nasal BID   Continuous Infusions: . morphine 5 mg/hr (Aug 28, 2020 1116)  . propofol (DIPRIVAN) infusion 30 mcg/kg/min (08/28/20 0914)   PRN Meds: acetaminophen, diphenhydrAMINE, glycopyrrolate **OR** glycopyrrolate **OR** glycopyrrolate, LORazepam, morphine, ondansetron **OR** ondansetron (ZOFRAN) IV, polyvinyl alcohol   Vital Signs    Vitals:   08-28-2020 0630 Aug 28, 2020 0753 28-Aug-2020 0800 08/28/20 1153  BP: (!) 118/53  (!) 123/44   Pulse: (!) 55  (!) 51   Resp: 20  14   Temp:  98.8 F (37.1 C)    TempSrc:  Axillary    SpO2: 100%  99%   Weight:      Height:   _0  (1.753 m) _1  (1.753 m)    Intake/Output Summary (Last 24 hours) at 08/28/20 1222 Last data filed at 08-28-2020 0436 Gross per 24 hour  Intake 918.99 ml  Output 3525 ml  Net -2606.01 ml   Last 3 Weights 08/23/2020 08/22/2020 08/21/2020  Weight (lbs) 234 lb 5.6 oz 242 lb 15.2 oz 239 lb 13.8 oz  Weight (kg) 106.3 kg 110.2 kg 108.8 kg  Some encounter information is confidential and restricted. Go to Review Flowsheets activity to see all data.      Telemetry    Atrial fibrillation, HR in 80's to 90's. Frequent ectopy with PVC's and episodes of ventricular bigeminy.  - Personally Reviewed  ECG    No new tracings.   Physical Exam   Intubated Sedated No murmur  Basilar crackles Plus one edema   Labs    High Sensitivity Troponin:   Recent Labs  Lab 08/21/20 1346 08/21/20 1821  TROPONINIHS 44*  39*      Chemistry Recent Labs  Lab 08/28/2020 0811 08/20/20 0501 08/22/20 0349 08/23/20 0518 08/24/20 0517  NA 140   < > 139 139 142  K 4.2   < > 5.4* 4.2 3.2*  CL 97*   < > 95* 96* 93*  CO2 35*   < > 31 29 36*  GLUCOSE 111*   < > 112* 76 118*  BUN 18   < > 32* 50* 51*  CREATININE 0.80   < > 1.46* 1.62* 1.22  CALCIUM 8.7*   < > 9.1 8.9 9.1  PROT 6.7  --   --   --   --   ALBUMIN 3.5  --   --   --   --   AST 17  --   --   --   --   ALT 13  --   --   --   --   ALKPHOS 83  --   --   --   --   BILITOT 1.1  --   --   --   --   GFRNONAA >60   < > 52* 46* >60  ANIONGAP 8   < > _2 < > = values in this interval not displayed.     Hematology Recent Labs  Lab 08/26/2020 0811 08/21/20 0410 08/23/20 0518  WBC 3.4* 5.1 5.7  RBC 4.72 4.70 4.33  HGB 14.0 13.8 12.5*  HCT 44.7 45.9 41.9  MCV 94.7 97.7 96.8  MCH 29.7 29.4 28.9  MCHC 31.3 30.1 29.8*  RDW 16.3* 16.0* 15.9*  PLT 113* 127* 109*    BNP Recent Labs  Lab 08/15/2020 0811  BNP 386.0*     DDimer No results for input(s): DDIMER in the last 168 hours.   Radiology    DG Chest 1 View  Result Date: 08/23/2020 CLINICAL DATA:  Respiratory failure. EXAM: CHEST  1 VIEW COMPARISON:  08/22/2020. FINDINGS: Endotracheal tube in stable position. Prior cardiac valve replacement. Cardiomegaly with pulmonary venous congestion again noted. Bilateral interstitial prominence consistent interstitial edema again noted. Left base atelectasis. Bilateral pleural effusions left side greater than right again noted. Similar findings noted on prior exam. Old bilateral rib fractures again noted. Prior thoracolumbar spine fusion. IMPRESSION: 1.  Endotracheal tube in stable position. 2. Prior cardiac valve replacement. Cardiomegaly with pulmonary venous congestion. Bilateral interstitial prominence most consistent with interstitial edema again noted. Left base atelectasis. Bilateral pleural effusions left side greater than right again noted. Chest  is unchanged from prior exam. Electronically Signed   By: Marcello Moores  Register   On: 08/23/2020 06:31   DG CHEST PORT 1 VIEW  Result Date: 08/22/2020 CLINICAL DATA:  Intubated EXAM: PORTABLE CHEST 1 VIEW COMPARISON:  08/21/2020, CT chest 08/31/2020, radiograph 04/26/2020 FINDINGS: Endotracheal tube tip is about 4 cm superior to the carina. There are post sternotomy changes. Large left pleural effusion and moderate right pleural effusion, appears increased on the left side. Cardiomegaly with vascular congestion and pulmonary edema. Left greater than right basilar airspace disease. Aortic atherosclerosis. No pneumothorax. Bilateral rib fractures appear chronic. IMPRESSION: 1. Endotracheal tube tip about 4 cm superior to the carina. 2. Cardiomegaly with vascular congestion and pulmonary edema. Large left and moderate right pleural effusions with left greater than right basilar airspace disease. Effusion on the left is increased compared to prior. Electronically Signed   By: Donavan Foil M.D.   On: 08/22/2020 20:13   DG [place] Dys Tube Plc W/Fl W/Rad  Result Date: 08/23/2020 CLINICAL DATA:  Nursing and position unable to pass nasogastric tube in ICU EXAM: NASO G TUBE PLACEMENT WITH FL AND WITH RADIOLOGIST CONTRAST:  79m ISOVUE-300 IOPAMIDOL (ISOVUE-300) INJECTION 61% FLUOROSCOPY TIME:  Fluoroscopy Time:  2 minutes 30 seconds Radiation Exposure Index (if provided by the fluoroscopic device): 52.1 mGy Number of Acquired Spot Images: 1 COMPARISON:  None FINDINGS: Standard 18 French nasogastric tube was placed via the RIGHT nostril using lubricant. Tube was placed into the proximal esophagus. Under fluoroscopic visualization, 2 was advanced into the stomach. Intragastric position confirmed with 20 cc of contrast via the tube. Tube was secured by nursing. IMPRESSION: Nasogastric tube placement as above Electronically Signed   By: MLavonia DanaM.D.   On: 08/23/2020 14:44    Cardiac Studies   Echocardiogram:  08/20/2020 IMPRESSIONS    1. Left ventricular ejection fraction, by estimation, is 25 to 30%. The  left ventricle has severely decreased function. The left ventricle  demonstrates global hypokinesis. There is mild concentric left ventricular  hypertrophy. Left ventricular diastolic  parameters are indeterminate.  2. Right ventricular systolic function is moderately reduced. The right  ventricular size is mildly enlarged. There is severely elevated pulmonary  artery systolic pressure. The estimated right ventricular systolic  pressure is 640.3mmHg.  3. Left atrial size was  severely dilated.  4. The mitral valve has been repaired/replaced. No evidence of mitral  valve regurgitation. Moderate mitral stenosis with normal flow suggestived  by gradient assessment.  5. Large pleural effusion.  6. The aortic valve is tricuspid. There is mild calcification of the  aortic valve. There is moderate thickening of the aortic valve. Aortic  valve regurgitation is not visualized. No aortic stenosis is present.  7. The inferior vena cava is dilated in size with <50% respiratory  variability, suggesting right atrial pressure of 15 mmHg.   Comparison(s): A prior study was performed on 04/17/2020. No significant  change from prior study. Prior images reviewed side by side.   Patient Profile     70 y.o. 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   Assessment & Plan   1. Acute on Chronic Combined Systolic and Diastolic CHF - EF 94-44% ok to d/c milrinone given DNR and comfort care has diuresed   2. Chronic Atrial Fibrillation/Ventricular  Bigeminy - rate is fine and no longer with PVCls   3. Bioprosthetic Mitral Valve - S/p surgical repair in 09/2012. Repeat echo on 2/13 showed moderate MS with no evidence of MR.   5. Acute Hypoxic Respiratory Failure -  DNR comfort care terminal extubation with no reintubation this am   Cardiology will sign off     For questions or updates, please contact [place] Please consult [email or web address] for contact info under        Signed, Jenkins Rouge, MD  2020/09/01, 12:22 PM

## 2020-09-05 NOTE — Progress Notes (Signed)
NAME:  Cody Hale, MRN:  [id number], DOB:  June 19, 1951, LOS: 6 ADMISSION DATE:  08-24-2020, CONSULTATION DATE:  08/10/2020  REFERRING MD:  Gwendolyn Lima, CHIEF COMPLAINT: Mechanical ventilation  Brief History:  70 year old NHR with HFrEF EF 25%, bioprosthetic mitral valve, chronic atrial fibrillation and COPD with acute hypercarbic respiratory failure requiring mechanical ventilation 2/15  History of Present Illness:  He is a nursing home resident .  EMS was called due to saturations 80% and placed on nasal cannula.  In the ED was given IV Lasix, CT angiogram was negative for PE and showed pulmonary edema.  He was admitted with a presumptive diagnosis of acute on chronic systolic heart failure.  Lasix was started at 40 and then increased to 80 mg, unfortunately did not have significant diuresis .  He was noted to have poor oral intake.  Palliative care was consulted.  He developed worsening mentation and ABG showed acute respiratory acidosis, he was placed on BiPAP but ABG was worse at 7.1 5/105/77 at which point he was intubated on 2/15.  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 Echo 2/13 EF 25 to 30%, global hypokinesis  Micro Data:   [facility] 2/12 >> ng Respiratory 2/16 >> GNR >>nml flora  Antimicrobials:    cefepime 2/17 >> vanc 2/17 >>  Interim History / Subjective:   Remains sedated on propofol Critically ill, intubated Diuresed well again last 24 hours about 4.1 L  Objective   Blood pressure (!) 123/44, pulse (!) 51, temperature 98.8 F (37.1 C), temperature source Axillary, resp. rate 14, height 5\' 9"  (1.753 m), weight 106.3 kg, SpO2 99 %.    Vent Mode: PRVC FiO2 (%):  [40 %] 40 % Set Rate:  [12 bmp] 12 bmp Vt Set:  [560 mL] 560 mL PEEP:  [5 cmH20] 5 cmH20 Plateau Pressure:  [19 cmH20-22 cmH20] 19 cmH20   Intake/Output Summary (Last 24 hours) at 08/21/2020 1012 Last data filed at 08/30/2020 0436 Gross per 24 hour  Intake 1043.03 ml  Output 4150 ml  Net -3106.97 ml   Filed Weights   08/21/20 0500 08/22/20 1249 08/23/20 0334  Weight: 108.8 kg 110.2 kg 106.3 kg    Examination: General: Elderly man, orally intubated, sedated with propofol, no distress HENT: Mild pallor, no icterus, no JVD Lungs: No accessory muscle use, decreased breath sounds bilateral, no rhonchi Cardiovascular: Midline sternotomy scar, s1s2 irregular, no murmur, PVCs + Abdomen: Soft nontender, no organomegaly Extremities: No deformity, 2+ peripheral edema Neuro: RASS -2, sedated GU: Clear urine  Chest x-ray 2/17 independently reviewed which shows improved aeration and decreased effusions  Labs from 2/17 show hypokalemia and improved renal function  Resolved Hospital Problem list     Assessment & Plan:  Acute on chronic hypoxic/hypercarbic respiratory failure Related to pulmonary edema with underlying COPD Baseline is 1 L nasal cannula daytime and BiPAP  at night  -Daily wake-up assessment with spontaneous breathing trials Palliative care goals of care noted - He has diuresed well & I am hopeful he can wean to a succesful 'one-way'extubation.  I would give him that chance. I fully agree with DNR & no reintubation  HAP vs  aspiration -We will treat for 7 days with cefepime and vancomycin  COPD -no evidence of bronchospasm DuoNebs every 6 prn +  Pulmicort  Acute on chronic systolic heart failure -  Started on milrinone 2/16 with brisk diuresis -Can DC after extubation Continue Lasix 80 every 12 for negative balance , add empiric potassium supplementation  Chronic atrial fibrillation -Per cardiology, currently rate controlled  NG tube placed under fluoroscopy, tube feeds started  Best practice (evaluated daily)  Diet: Tube feeds dc'd Pain/Anxiety/Delirium protocol (if indicated): Precedex/fentanyl, goal RASS zero to -1 VAP protocol (if indicated): Yes DVT prophylaxis: Therapeutic Lovenox GI prophylaxis: Protonix Glucose control: SSI Mobility: Bedrest Disposition: ICU  Goals of Care:  Last date of multidisciplinary goals of care discussion: 2/17 by palliative care Family and staff present: Daughter Summary of discussion: Full code Follow up goals of care discussion due: Per primary and palliative care Code Status: DNR  Labs   CBC: Recent Labs  Lab 08/16/2020 0811 08/21/20 0410 08/23/20 0518  WBC 3.4* 5.1 5.7  HGB 14.0 13.8 12.5*  HCT 44.7 45.9 41.9  MCV 94.7 97.7 96.8  PLT 113* 127* 109*    Basic Metabolic Panel: Recent Labs  Lab 08/20/20 0501 08/21/20 0410 08/22/20 0349 08/23/20 0518 08/23/20 1948 08/24/20 0517  NA 143 142 139 139  --  142  K 5.2* 5.1 5.4* 4.2  --  3.2*  CL 100 97* 95* 96*  --  93*  CO2 36* 33* 31 29  --  36*  GLUCOSE 107* 93 112* 76  --  118*  BUN 15 21 32* 50*  --  51*  CREATININE 0.77 1.07 1.46* 1.62*  --  1.22  CALCIUM 9.1 9.1 9.1 8.9  --  9.1  MG  --  2.2 2.4 2.4 2.3 2.2  PHOS  --   --   --   --  2.7 2.9   GFR: Estimated Creatinine Clearance: 68.6 mL/min (by C-G formula based on SCr of 1.22 mg/dL). Recent Labs  Lab 09/02/2020 0811 09/04/2020 0813 08/26/2020 1011 08/21/20 0410 08/23/20 0518  WBC 3.4*  --   --  5.1 5.7  LATICACIDVEN  --  1.3 0.8  --    --     Liver Function Tests: Recent Labs  Lab 08/08/2020 0811  AST 17  ALT 13  ALKPHOS 83  BILITOT 1.1  PROT 6.7  ALBUMIN 3.5   No results for input(s): LIPASE, AMYLASE in the last 168 hours. No results for input(s): AMMONIA in the last 168 hours.  ABG    Component Value Date/Time   PHART 7.513 (H) 08/23/2020 0750   PCO2ART 43.4 08/23/2020 0750   PO2ART 115 (H) 08/23/2020 0750   HCO3 34.3 (H) 08/23/2020 0750   TCO2 30 05/27/2019 0902   ACIDBASEDEF 1.7 03/02/2018 2230   O2SAT 98.4 08/23/2020 0750     Coagulation Profile: Recent Labs  Lab 08/30/2020 0811  INR 1.5*     Critical care time: 31 m      Cyril Mourning MD. [facility] Pager : [phone]  If no response to pager , please call [phone] until 7 pm After 7:00 pm call Elink  [phone]  08/28/2020     

## 2020-09-05 NOTE — Progress Notes (Signed)
RT extubated patient to 4L O2 via nasal cannula per orders. Patient is comfort care ,RN and Family at bedside

## 2020-09-05 NOTE — Death Summary Note (Signed)
DEATH SUMMARY   Patient Details  Name: Cody Hale MRN: [id number] DOB: 11-Jun-1951  Admission/Discharge Information   Admit Date:  Sep 17, 2020  Date of Death: Date of Death: 23-Sep-2020  Time of Death: Time of Death: 77  Length of Stay: 09/11/22  Referring Physician: Rosaria Ferries, MD   Reason(s) for Hospitalization  Leandro Leinberger is a 70 y.o. male with a history of atrial fibrillation chronic anticoagulation, chronic systolic and diastolic heart failure, COPD, type 2 diabetes, hypertension.  Resident of high [facility] facility.  Patient was called to the facility due to hypoxia and difficulty breathing.  Initially on EMS arrival he was found to have an oxygen saturation in the 80s.  He was placed on supplemental oxygen.  He did have some leg swellings for a couple weeks.  His shortness of breath is worse with lying down flat and exertion.  No other palliating or provoking factors.  Initial oxygen saturation in the 80s. Patient given IV Lasix.  Chest x-ray shows atelectasis and effusions.  CTA showed was negative for PE but showed pulmonary edema.   Diagnoses  Preliminary cause of death:  Congestive heart failure combined systolic and diastolic   Secondary Diagnoses (including complications and co-morbidities):  Active Problems:   OSA (obstructive sleep apnea)   Type II diabetes mellitus with manifestations (HCC)   COPD (chronic obstructive pulmonary disease) with chronic bronchitis (HCC)   GERD with esophagitis   Acute on chronic respiratory failure with hypoxia (HCC)   Acute on chronic combined systolic and diastolic CHF (congestive heart failure) (HCC)   Chronic atrial fibrillation (HCC)   Acute and chronic respiratory failure with hypercapnia (HCC)   Acute hypoxemic respiratory failure (HCC)   Pressure injury of skin   Do Not Resuscitate   Brief Hospital Course (including significant findings, care, treatment, and services provided and events leading to death)   Acute  hypoxemic respiratory failure secondary to acute on chronic combined systolic and diastolic heart failure exacerbation -Pt was initially treated with Lasix 80 mg IV BID management per cardiology team.  Milronone infusion started by PCCM with only minimal improvement in diuresis.  Cardiology felt patient not a good candidate for advanced heart failure therapies and recommended palliative medicine.    -2D echocardiogram performedwith similar results as prior studies with LVEF 25-30% -Cardiology and pulmonology consulted this admission.   Mild hyperkalemia - RESOLVED   Poor oral intake/Failure to thrive -NG placement under fluoroscopy completed and tube feeding started.  -Palliative evaluation was requested.   AKI - resolved  -Poor urine output noted  Atrial fibrillation-rate controlled  COPD --Bronchodilators ordered and given  Type 2 diabetes -Managed per protocol with SSI and CBG testing.   OSA -He was intubated until compassionate extubation  Goals of care -I have discussed with 2 different cardiologists and also with intensivist/pulmonologist, pt not a candidate for advanced therapies given comorbidities, cardiology recommended palliative care.  I met with palliative medicine, bedside RN and family and conferenced with them for goals of care.  The decision was made for DNR status.  Also family requested compassionate extubation on AM Sep 23, 2020.  Pt expired shortly after extubation.      Pertinent Labs and Studies  Significant Diagnostic Studies DG Chest 1 View  Result Date: 08/23/2020 CLINICAL DATA:  Respiratory failure. EXAM: CHEST  1 VIEW COMPARISON:  08/22/2020. FINDINGS: Endotracheal tube in stable position. Prior cardiac valve replacement. Cardiomegaly with pulmonary venous congestion again noted. Bilateral interstitial prominence consistent interstitial edema again noted. Left base  atelectasis. Bilateral pleural effusions left side greater than right again noted.  Similar findings noted on prior exam. Old bilateral rib fractures again noted. Prior thoracolumbar spine fusion. IMPRESSION: 1.  Endotracheal tube in stable position. 2. Prior cardiac valve replacement. Cardiomegaly with pulmonary venous congestion. Bilateral interstitial prominence most consistent with interstitial edema again noted. Left base atelectasis. Bilateral pleural effusions left side greater than right again noted. Chest is unchanged from prior exam. Electronically Signed   By: Marcello Moores  Register   On: 08/23/2020 06:31   DG Chest 1 View  Result Date: 08/21/2020 CLINICAL DATA:  Tachycardia. EXAM: CHEST  1 VIEW COMPARISON:  August 19, 2020. FINDINGS: Stable cardiomegaly. Sternotomy wires are noted. No pneumothorax is noted. Old bilateral rib fractures are noted increased bibasilar opacities are noted concerning for worsening edema or atelectasis with associated pleural effusions, left greater than right. IMPRESSION: Increased bibasilar opacities are noted concerning for worsening edema or atelectasis with associated pleural effusions, left greater than right. Electronically Signed   By: Marijo Conception M.D.   On: 08/21/2020 13:58   CT Angio Chest PE W/Cm &/Or Wo Cm  Result Date: 08/12/2020 CLINICAL DATA:  Bilateral leg swelling for approximately 1 week. Decreased oxygen saturation on room air. History of hypertension, diabetes, COPD and CHF. EXAM: CT ANGIOGRAPHY CHEST WITH CONTRAST TECHNIQUE: Multidetector CT imaging of the chest was performed using the standard protocol during bolus administration of intravenous contrast. Multiplanar CT image reconstructions and MIPs were obtained to evaluate the vascular anatomy. CONTRAST:  123m OMNIPAQUE IOHEXOL 350 MG/ML SOLN COMPARISON:  Chest CT, 03/21/2020.  Current chest radiograph. FINDINGS: Cardiovascular: Pulmonary arteries are well opacified. No evidence of a pulmonary embolism. Stable changes from previous cardiac surgery aortic valve replacement.  Three-vessel coronary artery calcifications. No pericardial effusion. Heart normal in size. Mild dilation of the main pulmonary artery, 3.6 cm. Aorta normal in caliber. Mild aortic atherosclerosis. No dissection. Mediastinum/Nodes: No neck base or axillary masses or enlarged lymph nodes. There scattered prominent mediastinal lymph nodes, precarinal node measuring 11 mm in short axis, subcarinal node measuring 11 mm in short axis. Nodes are stable from the prior CT. Trachea and esophagus are unremarkable. Lungs/Pleura: Moderate, right greater than left, pleural effusions. Left lower lobe is completely collapsed with atelectasis. There is significant atelectasis the right lower lobe. Additional atelectasis is noted in the dependent upper lobes, left greater than right. Aerated portions of the lungs show interstitial thickening. No mass or suspicious nodule. No pneumothorax. Upper Abdomen: No acute abnormality. Musculoskeletal: No acute fractures. No osteoblastic or osteolytic lesions. Old rib fractures. Vertebroplasty cement T11 and T12 vertebra. Previous posterior fusion from T11 into the lumbar spine below the included field of view. Review of the MIP images confirms the above findings. IMPRESSION: 1. No evidence of a pulmonary embolism. 2. Moderate bilateral pleural effusions, greater on the right. Significant associated dependent atelectasis similar to the prior CT. Aerated lungs show interstitial thickening consistent with interstitial pulmonary edema. No convincing pneumonia. 3. Stable changes from prior cardiac surgery. Three-vessel coronary artery calcifications. 4. Mildly enlarged main pulmonary artery suggesting a component pulmonary hypertension. This is stable. 5. Aortic atherosclerosis. Aortic Atherosclerosis (ICD10-I70.0). Electronically Signed   By: DLajean ManesM.D.   On: 08/18/2020 16:02   DG CHEST PORT 1 VIEW  Result Date: 08/24/2020 CLINICAL DATA:  Respiratory failure EXAM: PORTABLE CHEST 1  VIEW COMPARISON:  08/23/2020 FINDINGS: Endotracheal and enteric tubes remain in good position. Post CABG changes. Stable cardiomegaly. Persistent bilateral pleural effusions, left greater  than right, with associated bibasilar atelectasis. Improving aeration of the lung fields compared to prior. No pneumothorax. IMPRESSION: Improving aeration of the lung fields compared to prior. Electronically Signed   By: Davina Poke D.O.   On: 08/24/2020 11:22   DG CHEST PORT 1 VIEW  Result Date: 08/22/2020 CLINICAL DATA:  Intubated EXAM: PORTABLE CHEST 1 VIEW COMPARISON:  08/21/2020, CT chest 08/18/2020, radiograph 04/26/2020 FINDINGS: Endotracheal tube tip is about 4 cm superior to the carina. There are post sternotomy changes. Large left pleural effusion and moderate right pleural effusion, appears increased on the left side. Cardiomegaly with vascular congestion and pulmonary edema. Left greater than right basilar airspace disease. Aortic atherosclerosis. No pneumothorax. Bilateral rib fractures appear chronic. IMPRESSION: 1. Endotracheal tube tip about 4 cm superior to the carina. 2. Cardiomegaly with vascular congestion and pulmonary edema. Large left and moderate right pleural effusions with left greater than right basilar airspace disease. Effusion on the left is increased compared to prior. Electronically Signed   By: Donavan Foil M.D.   On: 08/22/2020 20:13   DG Chest Portable 1 View  Result Date: 08/30/2020 CLINICAL DATA:  Hypoxia EXAM: PORTABLE CHEST 1 VIEW COMPARISON:  April 26, 2020 FINDINGS: There is persistent airspace opacity/consolidation in the left lower lobe with small left pleural effusion. Right lung is clear. There is cardiomegaly with pulmonary vascularity within normal limits. Status post median sternotomy. No adenopathy. No bone lesions. IMPRESSION: Persistent left lower lobe consolidation with small left pleural effusion. Right lung clear. Stable cardiomegaly. Electronically Signed   By:  Lowella Grip III M.D.   On: 08/13/2020 08:34   DG [place] Dys Tube Plc W/Fl W/Rad  Result Date: 08/23/2020 CLINICAL DATA:  Nursing and position unable to pass nasogastric tube in ICU EXAM: NASO G TUBE PLACEMENT WITH FL AND WITH RADIOLOGIST CONTRAST:  25m ISOVUE-300 IOPAMIDOL (ISOVUE-300) INJECTION 61% FLUOROSCOPY TIME:  Fluoroscopy Time:  2 minutes 30 seconds Radiation Exposure Index (if provided by the fluoroscopic device): 52.1 mGy Number of Acquired Spot Images: 1 COMPARISON:  None FINDINGS: Standard 18 French nasogastric tube was placed via the RIGHT nostril using lubricant. Tube was placed into the proximal esophagus. Under fluoroscopic visualization, 2 was advanced into the stomach. Intragastric position confirmed with 20 cc of contrast via the tube. Tube was secured by nursing. IMPRESSION: Nasogastric tube placement as above Electronically Signed   By: MLavonia DanaM.D.   On: 08/23/2020 14:44   ECHOCARDIOGRAM COMPLETE  Result Date: 08/20/2020    ECHOCARDIOGRAM REPORT   Patient Name:   CKEMONTE ULLMANDate of Exam: 08/20/2020 Medical Rec #:  [id number]   Height:       72.0 in Accession #:    [id number]  Weight:       240.1 lb Date of Birth:  410-09-1950   BSA:          2.303 m Patient Age:    89years     BP:           150/87 mmHg Patient Gender: M            HR:           75 bpm. Exam [facility]:  [facility]cedure: 2D Echo, Cardiac Doppler, Color Doppler and Intracardiac            Opacification Agent Indications:    CHF  History:        Patient has prior history of Echocardiogram examinations, most  recent 04/17/2020. CHF, CAD, Prior CABG, COPD, Mitral Valve                 Disease, Arrythmias:Atrial Fibrillation,                 Signs/Symptoms:Shortness of Breath; Risk Factors:Hypertension                 and Diabetes. Resp. failure.  Sonographer:    Dustin Flock RDCS Referring Phys: [place]  1. Left ventricular ejection fraction, by estimation, is 25 to  30%. The left ventricle has severely decreased function. The left ventricle demonstrates global hypokinesis. There is mild concentric left ventricular hypertrophy. Left ventricular diastolic  parameters are indeterminate.  2. Right ventricular systolic function is moderately reduced. The right ventricular size is mildly enlarged. There is severely elevated pulmonary artery systolic pressure. The estimated right ventricular systolic pressure is 93.5 mmHg.  3. Left atrial size was severely dilated.  4. The mitral valve has been repaired/replaced. No evidence of mitral valve regurgitation. Moderate mitral stenosis with normal flow suggestived by gradient assessment.  5. Large pleural effusion.  6. The aortic valve is tricuspid. There is mild calcification of the aortic valve. There is moderate thickening of the aortic valve. Aortic valve regurgitation is not visualized. No aortic stenosis is present.  7. The inferior vena cava is dilated in size with <50% respiratory variability, suggesting right atrial pressure of 15 mmHg. Comparison(s): A prior study was performed on 04/17/2020. No significant change from prior study. Prior images reviewed side by side. FINDINGS  Left Ventricle: Left ventricular ejection fraction, by estimation, is 25 to 30%. The left ventricle has severely decreased function. The left ventricle demonstrates global hypokinesis. Definity contrast agent was given IV to delineate the left ventricular endocardial borders. The left ventricular internal cavity size was normal in size. There is mild concentric left ventricular hypertrophy. Left ventricular diastolic parameters are indeterminate. Right Ventricle: The right ventricular size is mildly enlarged. No increase in right ventricular wall thickness. Right ventricular systolic function is moderately reduced. There is severely elevated pulmonary artery systolic pressure. The tricuspid regurgitant velocity is 3.66 m/s, and with an assumed right atrial  pressure of 15 mmHg, the estimated right ventricular systolic pressure is 70.1 mmHg. Left Atrium: Left atrial size was severely dilated. Right Atrium: Right atrial size was normal in size. Pericardium: There is no evidence of pericardial effusion. Mitral Valve: PHT > 130 (160 ms). The mitral valve has been repaired/replaced. No evidence of mitral valve regurgitation. 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 Moderate mitral valve stenosis. MV peak gradient, 25.8  mmHg. The mean mitral valve gradient is 7.5 mmHg. Tricuspid Valve: The tricuspid valve is normal in structure. Tricuspid valve regurgitation is trivial. Aortic Valve: The aortic valve is tricuspid. There is mild calcification of the aortic valve. There is moderate thickening of the aortic valve. Aortic valve regurgitation is not visualized. No aortic stenosis is present. Pulmonic Valve: The pulmonic valve was not well visualized. Pulmonic valve regurgitation is not visualized. No evidence of pulmonic stenosis. Aorta: The aortic root is normal in size and structure and the ascending aorta was not well visualized. Venous: The inferior vena cava is dilated in size with less than 50% respiratory variability, suggesting right atrial pressure of 15 mmHg. IAS/Shunts: The atrial septum is grossly normal. Additional Comments: There is a large pleural effusion.  LEFT VENTRICLE PLAX 2D LVIDd:  5.27 cm  Diastology LVIDs:         4.74 cm  LV e' medial:    3.81 cm/s LV PW:         1.26 cm  LV E/e' medial:  48.0 LV IVS:        1.24 cm  LV e' lateral:   5.55 cm/s LVOT diam:     2.50 cm  LV E/e' lateral: 33.0 LV SV:         91 LV SV Index:   40 LVOT Area:     4.91 cm  RIGHT VENTRICLE RV Basal diam:  4.52 cm RV S prime:     5.44 cm/s TAPSE (M-mode): 2.0 cm LEFT ATRIUM              Index       RIGHT ATRIUM           Index LA diam:        4.50 cm  1.95 cm/m  RA Area:     17.90 cm LA Vol (A2C):   129.0 ml 56.02 ml/m RA Volume:    44.90 ml  19.50 ml/m LA Vol (A4C):   145.0 ml 62.97 ml/m LA Biplane Vol: 138.0 ml 59.93 ml/m  AORTIC VALVE LVOT Vmax:   98.90 cm/s LVOT Vmean:  57.400 cm/s LVOT VTI:    0.186 m  AORTA Ao Root diam: 2.80 cm MITRAL VALVE                TRICUSPID VALVE MV Area (PHT): 1.65 cm     TR Peak grad:   53.6 mmHg MV Area VTI:   1.20 cm     TR Vmax:        366.00 cm/s MV Peak grad:  25.8 mmHg MV Mean grad:  7.5 mmHg     SHUNTS MV Vmax:       2.54 m/s     Systemic VTI:  0.19 m MV Vmean:      117.5 cm/s   Systemic Diam: 2.50 cm MV Decel Time: 459 msec MV E velocity: 183.00 cm/s Rudean Haskell MD Electronically signed by Rudean Haskell MD Signature Date/Time: 08/20/2020/12:23:20 PM    Final     Microbiology Recent Results (from the past 240 hour(s))  Culture, blood (routine x 2)     Status: None   Collection Time: 08/16/2020  8:18 AM   Specimen: BLOOD RIGHT FOREARM  Result Value Ref Range Status   Specimen Description BLOOD RIGHT FOREARM  Final   Special Requests   Final    BOTTLES DRAWN AEROBIC AND ANAEROBIC Blood Culture adequate volume   Culture   Final    NO GROWTH 6 DAYS Performed at [facility], [address] [place], [place] [address]    Report Status September 18, 2020 FINAL  Final  Culture, blood (routine x 2)     Status: None   Collection Time: 09/01/2020  8:40 AM   Specimen: BLOOD RIGHT ARM  Result Value Ref Range Status   Specimen Description   Final    BLOOD RIGHT ARM Performed at [facility], [address], [place], [place] [address]    Special Requests   Final    BOTTLES DRAWN AEROBIC AND ANAEROBIC Blood Culture adequate volume Performed at [facility], [address], [place],  [address]    Culture   Final    NO GROWTH 5 DAYS Performed at [facility], [place] [address], [place],  [address]    Report  Status 08/24/2020 FINAL  Final  SARS CORONAVIRUS 2 (TAT 6-24 HRS) Nasopharyngeal Nasopharyngeal Swab     Status: None   Collection Time: 08/23/2020  8:50 AM    Specimen: Nasopharyngeal Swab  Result Value Ref Range Status   SARS Coronavirus 2 NEGATIVE NEGATIVE Final    Comment: (NOTE) SARS-CoV-2 target nucleic acids are NOT DETECTED.  The SARS-CoV-2 RNA is generally detectable in upper and lower respiratory specimens during the acute phase of infection. Negative results do not preclude SARS-CoV-2 infection, do not rule out co-infections with other pathogens, and should not be used as the sole basis for treatment or other patient management decisions. Negative results must be combined with clinical observations, patient history, and epidemiological information. The expected result is Negative.  Fact Sheet for Patients: SugarRoll.be  Fact Sheet for Healthcare Providers: https://www.woods-mathews.com/  This test is not yet approved or cleared by the [place] FDA and  has been authorized for detection and/or diagnosis of SARS-CoV-2 by FDA under an Emergency Use Authorization (EUA). This EUA will remain  in effect (meaning this test can be used) for the duration of the COVID-19 declaration under Se ction 564(b)(1) of the Act, 21 U.S.C. section 360bbb-3(b)(1), unless the authorization is terminated or revoked sooner.  Performed at [facility], [place] [address], [place], [place] [address]   MRSA PCR Screening     Status: Abnormal   Collection Time: 08/22/20 12:55 PM   Specimen: Nasopharyngeal  Result Value Ref Range Status   MRSA by PCR POSITIVE (A) NEGATIVE Final    Comment:        The GeneXpert MRSA Assay (FDA approved for NASAL specimens only), is one component of a comprehensive MRSA colonization surveillance program. It is not intended to diagnose MRSA infection nor to guide or monitor treatment for MRSA infections. RESULT CALLED TO, READ BACK BY AND VERIFIED WITH: DELLER,J @ 2113 ON 08/22/20 BY JUW Performed at [facility], [address] [place], [place] [address]   Culture,  Respiratory w Gram Stain     Status: None   Collection Time: 08/23/20  9:16 AM   Specimen: Tracheal Aspirate; Respiratory  Result Value Ref Range Status   Specimen Description   Final    TRACHEAL ASPIRATE Performed at [facility], [address], [place], [place] [address]    Special Requests   Final    NONE Performed at [facility], [address] [place], [place] [address]    Gram Stain   Final    ABUNDANT WBC PRESENT, PREDOMINANTLY PMN ABUNDANT GRAM POSITIVE COCCI ABUNDANT GRAM NEGATIVE RODS FEW GRAM POSITIVE RODS    Culture   Final    MODERATE Consistent with normal respiratory flora. No Pseudomonas species isolated Performed at [place] [address], [place], [place] [address]    Report Status September 24, 2020 FINAL  Final  Culture, Respiratory w Gram Stain     Status: None (Preliminary result)   Collection Time: 08/24/20 12:22 PM   Specimen: Tracheal Aspirate; Respiratory  Result Value Ref Range Status   Specimen Description   Final    TRACHEAL ASPIRATE Performed at [facility], [address] [place], [place] [address]    Special Requests   Final    NONE Performed at [facility], [address], [place], [place] [address]    Gram Stain   Final    ABUNDANT WBC PRESENT, PREDOMINANTLY PMN MODERATE GRAM POSITIVE COCCI MODERATE GRAM POSITIVE RODS    Culture   Final  CULTURE REINCUBATED FOR BETTER GROWTH Performed at [facility], [place] [address], [place], [place] [address]    Report Status PENDING  Incomplete    Lab Basic Metabolic Panel: Recent Labs  Lab 08/20/20 0501 08/21/20 0410 08/22/20 0349 08/23/20 0518 08/23/20 1948 08/24/20 0517  NA 143 142 139 139  --  142  K 5.2* 5.1 5.4* 4.2  --  3.2*  CL 100 97* 95* 96*  --  93*  CO2 36* 33* 31 29  --  36*  GLUCOSE 107* 93 112* 76  --  118*  BUN 15 21 32* 50*  --  51*  CREATININE 0.77 1.07 1.46* 1.62*  --  1.22  CALCIUM 9.1 9.1 9.1 8.9  --  9.1  MG  --  2.2 2.4 2.4 2.3 2.2  PHOS  --    --   --   --  2.7 2.9   Liver Function Tests: Recent Labs  Lab 08/23/2020 0811  AST 17  ALT 13  ALKPHOS 83  BILITOT 1.1  PROT 6.7  ALBUMIN 3.5   No results for input(s): LIPASE, AMYLASE in the last 168 hours. No results for input(s): AMMONIA in the last 168 hours. CBC: Recent Labs  Lab 08/12/2020 0811 08/21/20 0410 08/23/20 0518  WBC 3.4* 5.1 5.7  HGB 14.0 13.8 12.5*  HCT 44.7 45.9 41.9  MCV 94.7 97.7 96.8  PLT 113* 127* 109*   Cardiac Enzymes: No results for input(s): CKTOTAL, CKMB, CKMBINDEX, TROPONINI in the last 168 hours. Sepsis Labs: Recent Labs  Lab 09/04/2020 0811 08/11/2020 0813 08/29/2020 1011 08/21/20 0410 08/23/20 0518  WBC 3.4*  --   --  5.1 5.7  LATICACIDVEN  --  1.3 0.8  --   --     Procedures/Operations   Tomeika Weinmann Wynetta Emery MD 2020/08/29, 3:21 PM  How to contact the [facility] Attending or Consulting provider [place] or covering provider during after hours [place], for this patient?  1. Check the care team in [facility] and look for a) attending/consulting [facility] provider listed and b) the [facility] team listed 2. Log into [email or web address] and use [place]'s universal password to access. If you do not have the password, please contact the hospital operator. 3. Locate the [facility] provider you are looking for under [facility] and page to a number that you can be directly reached. 4. If you still have difficulty reaching the provider, please page the [facility] (Director on Call) for the Hospitalists listed on amion for assistance.

## 2020-09-05 NOTE — Progress Notes (Signed)
Referral from NP Constance Haw that patient family would be arriving soon and conversation around EOL and comfort care. Chaplain found sisters, brother, and son present bedside. Introduced self and provided opportunity for reflection, life review, affirmation of faith, and prayer today. Remained available and present at time of extubation and death. Family coping appropriately. PC will remain available as needed or requested in order to provide spiritual support and to assess for spiritual need.

## 2020-09-05 NOTE — Progress Notes (Signed)
Daily Progress Note   Patient Name: Cody Hale       Date: 02-Sep-2020 DOB: 03-05-1951  Age: 70 y.o. MRN#: [id number] Attending Physician: Murlean Iba, MD Primary Care Physician: Rosaria Ferries, MD Admit Date: 08/27/2020  Reason for Consultation/Follow-up: Establishing goals of care  Subjective: Family is ready to transition to full comfort and extubate this morning.  Met with patient's son and his sisters prior to extubation. They asked if there was anyway that he could recover. We discussed that the possibility of recovery always remains for any patient. However, given his functional status over the last year and his poor heart function- it is likely he would again have a prolonged amount of time on ventilator and prolonged rehab and likely to decline even faster than this time. Family expressed the last year had been very difficult for Shanon Brow and they would not wish for him to suffer through that again. They wish for him to be extubated to comfort and allowed to go with symptom management and support.   Review of Systems  Unable to perform ROS: Acuity of condition    Length of Stay: 6  Current Medications: Scheduled Meds:  . chlorhexidine gluconate (MEDLINE KIT)  15 mL Mouth Rinse BID  . Chlorhexidine Gluconate Cloth  6 each Topical Daily  . glycopyrrolate  0.2 mg Intravenous NOW  . mouth rinse  15 mL Mouth Rinse 10 times per day  . mupirocin ointment  1 application Nasal BID    Continuous Infusions: . morphine    . propofol (DIPRIVAN) infusion 30 mcg/kg/min (2020-09-02 0914)    PRN Meds: acetaminophen, diphenhydrAMINE, glycopyrrolate **OR** glycopyrrolate **OR** glycopyrrolate, LORazepam, morphine injection, morphine, ondansetron **OR** ondansetron (ZOFRAN) IV,  polyvinyl alcohol, propofol  Physical Exam Vitals and nursing note reviewed.  Constitutional:      Appearance: He is ill-appearing.  Neurological:     Comments: Responds to noxious stimuli- sedated             Vital Signs: BP (!) 123/44   Pulse (!) 51   Temp 98.8 F (37.1 C) (Axillary)   Resp 14   Ht 5' 9"  (1.753 m)   Wt 106.3 kg   SpO2 99%   BMI 34.61 kg/m  SpO2: SpO2: 99 % O2 Device: O2 Device: Ventilator O2 Flow Rate: O2 Flow Rate (  L/min): 5.2 L/min  Intake/output summary:   Intake/Output Summary (Last 24 hours) at 09/14/2020 0958 Last data filed at 09/14/2020 0436 Gross per 24 hour  Intake 1130.37 ml  Output 4150 ml  Net -3019.63 ml   LBM: Last BM Date: 08/24/20 Baseline Weight: Weight: 107.5 kg Most recent weight: Weight: 106.3 kg       Palliative Assessment/Data: PPS: 10%      Patient Active Problem List   Diagnosis Date Noted  . Pressure injury of skin 09-14-2020  . Acute hypoxemic respiratory failure ([place])   . Acute and chronic respiratory failure with hypercapnia ([place]) 08/14/2020  . Acute on chronic respiratory failure with hypoxia ([place])   . Severe sepsis ([place])   . Acute on chronic combined systolic and diastolic CHF (congestive heart failure) ([place])   . Chronic atrial fibrillation ([place])   . Sleep apnea   . COPD, severe ([place])   . Endotracheally intubated   . Palliative care by specialist   . Streptococcus G Bacteremia 03/19/2020  . Community acquired pneumonia 03/16/2020  . Sepsis due to pneumonia 03/16/2020  . Need for Tdap vaccination 03/02/2020  . Need for influenza vaccination 03/02/2020  . Iron deficiency anemia due to chronic blood loss 03/02/2020  . Deficiency anemia 03/01/2020  . Allergic rhinitis due to pollen 03/01/2020  . NSVT (nonsustained ventricular tachycardia) ([place])   . COVID-19   . COPD with acute exacerbation ([place]) 01/13/2019  . Chronic respiratory failure with hypoxia ([place])   . Major depressive disorder, recurrent severe  without psychotic features ([place]) 05/19/2018  . GERD with esophagitis 04/22/2018  . Nonischemic cardiomyopathy ([place]) 03/23/2018  . Therapeutic opioid-induced constipation ([facility]) 12/04/2017  . Venous stasis dermatitis of both lower extremities 07/31/2017  . Unilateral primary osteoarthritis, right hip 04/22/2017  . 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 Seasonal allergic rhinitis due to pollen 03/13/2016  . Chronic atrial fibrillation 01/11/2015  . S/P mitral valve replacement with bioprosthetic valve 01/11/2015  . Depression with somatization 11/22/2014  . Primary osteoarthritis of both knees 11/22/2014  . COPD (chronic obstructive pulmonary disease) with chronic bronchitis ([place]) 05/12/2014  . B12 deficiency anemia 02/03/2014  . Goals of care, counseling/discussion 03/21/2013  . Hyperlipidemia with target LDL less than 70 03/19/2013  . Chronic systolic CHF (congestive heart failure) ([place]) 02/17/2013  . Essential hypertension, malignant 12/01/2012  . Type II diabetes mellitus with manifestations ([place])   . Gastroparesis due to DM ([place]) 11/30/2012  . OSA (obstructive sleep apnea) 11/16/2012  . Endocarditis-resolved 08/26/2012  . Class 2 severe obesity due to excess calories with serious comorbidity and body mass index (BMI) of 35.0 to 35.9 in adult [facility]) 06/27/2012    Palliative Care Assessment & Plan   Patient Profile: 70 y.o. male  with past medical history of CHF (most recent ECHO shows EF 25-30%), mitral valve replacement, a fib, COPD, DM2, HTN, T-11/L4 fusion with wound dehiscence, history of dysphagia with PEG placed 05/2019 and was removed 11/2019 admitted on 08/22/2020 with hypoxia and lower extremity swelling. Workup reveals CHF exacerbation with pleural effusion. Hypercarbic today and transferred to stepdown and placed on bipap. He has been reporting that he doesn't feel well and has poor po intake. He failed bipap, and intubation with ventilation was required. Palliative  medicine consulted for goals of care.     Assessment/Recommendations/Plan   Plan for extubation to comfort  Continue propofol at current rate  Add morphine for comfort   Goals of Care and Additional Recommendations:  Limitations on Scope of Treatment: Full Comfort Care  Code Status:  DNR  Prognosis:   Hours - Days  Discharge Planning:  Anticipated Hospital Death  Care plan was discussed with patient's family members and care team.   Thank you for allowing the Palliative Medicine Team to assist in the care of this patient.   Addendum- I remained at bedside with patient and family during extubation. He required minimal bolus dosing of morphine to achieve comfort due to grimacing and some air hunger after extubation. Comfort was achieved and maintained until the time of his death.   Greater than 50%  of this time was spent counseling and coordinating care related to the above assessment and plan.  Mariana Kaufman, AGNP-C Palliative Medicine   Please contact Palliative Medicine Team phone at [phone] for questions and concerns.   Total time: 58 minutes

## 2020-09-05 DEATH — deceased

## 2020-10-17 IMAGING — DX DG FOREARM 2V*R*
2 series · 2 of 2 positions shown · non-contrast
Comparison: Right wrist and elbow x-rays dated May 16, 2018.

CLINICAL DATA: Right forearm pain after fall.

EXAM:
RIGHT FOREARM - 2 VIEW

[forearm lat]
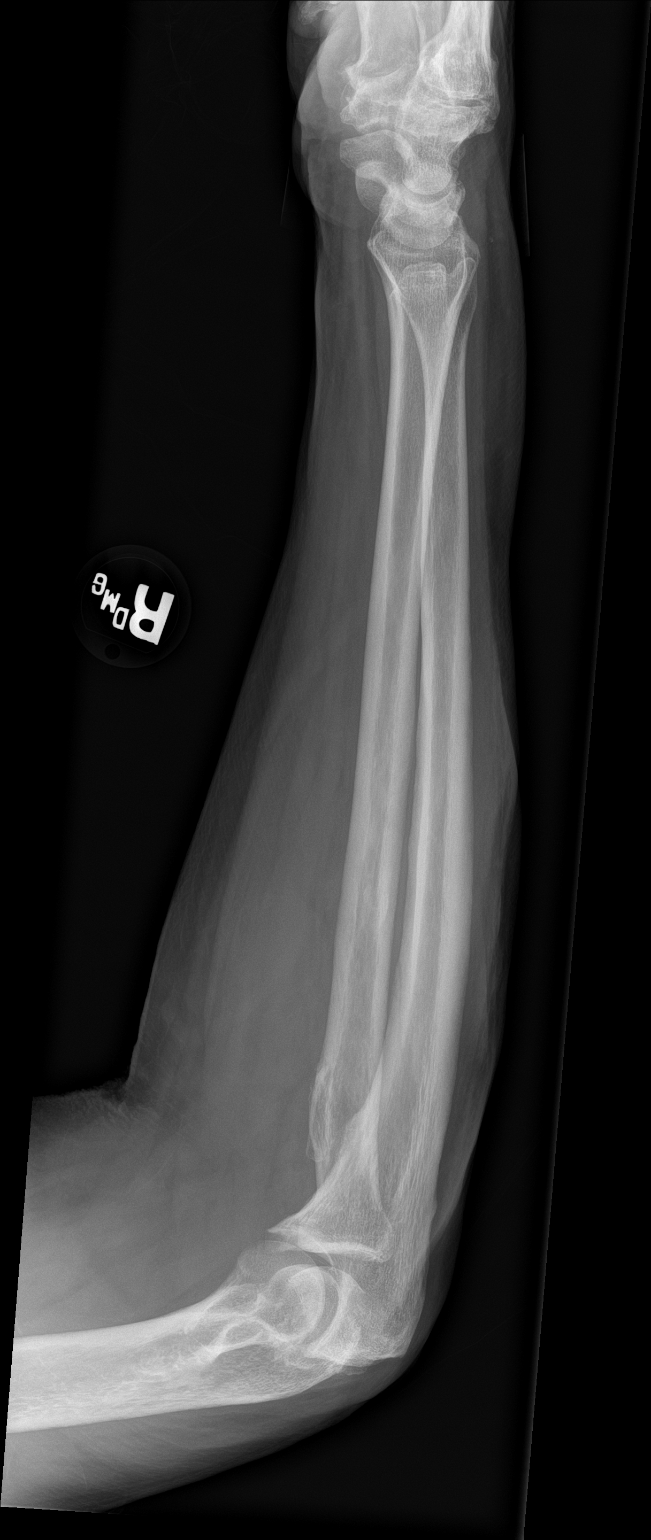

[forearm ap]
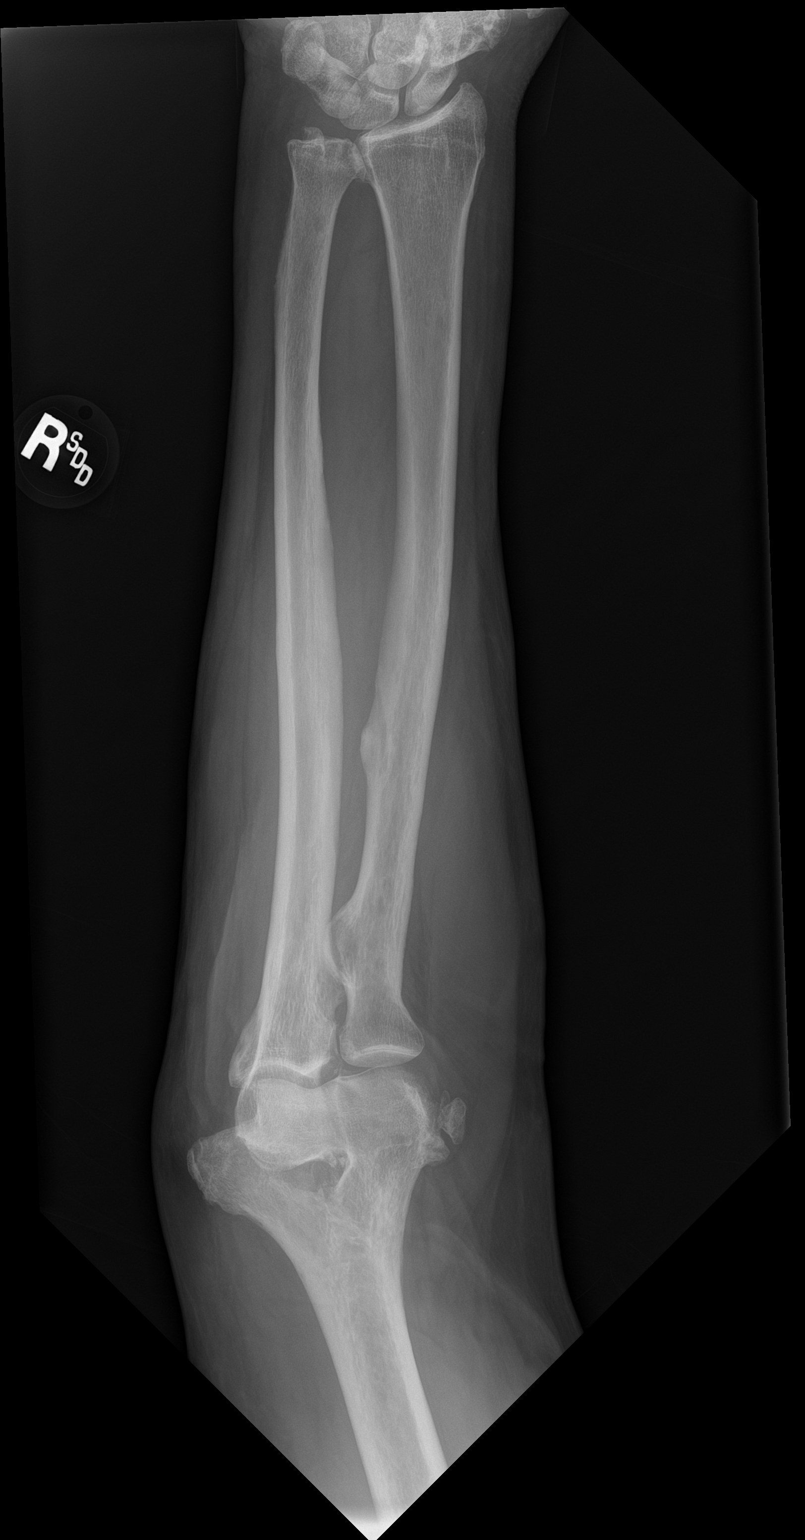

[2 of 2 positions shown; findings below may reference images not displayed]

FINDINGS: There is no evidence of fracture or other focal bone lesions. Soft
tissues are unremarkable.
IMPRESSION: Negative.

## 2020-10-17 IMAGING — DX DG PELVIS 1-2V
1 series · 1 of 1 positions shown · non-contrast
Comparison: Pelvic and right hip x-rays dated September 08, 2018.

CLINICAL DATA: Low back pain after fall.

EXAM:
PELVIS - 1-2 VIEW

[pelvis ap]
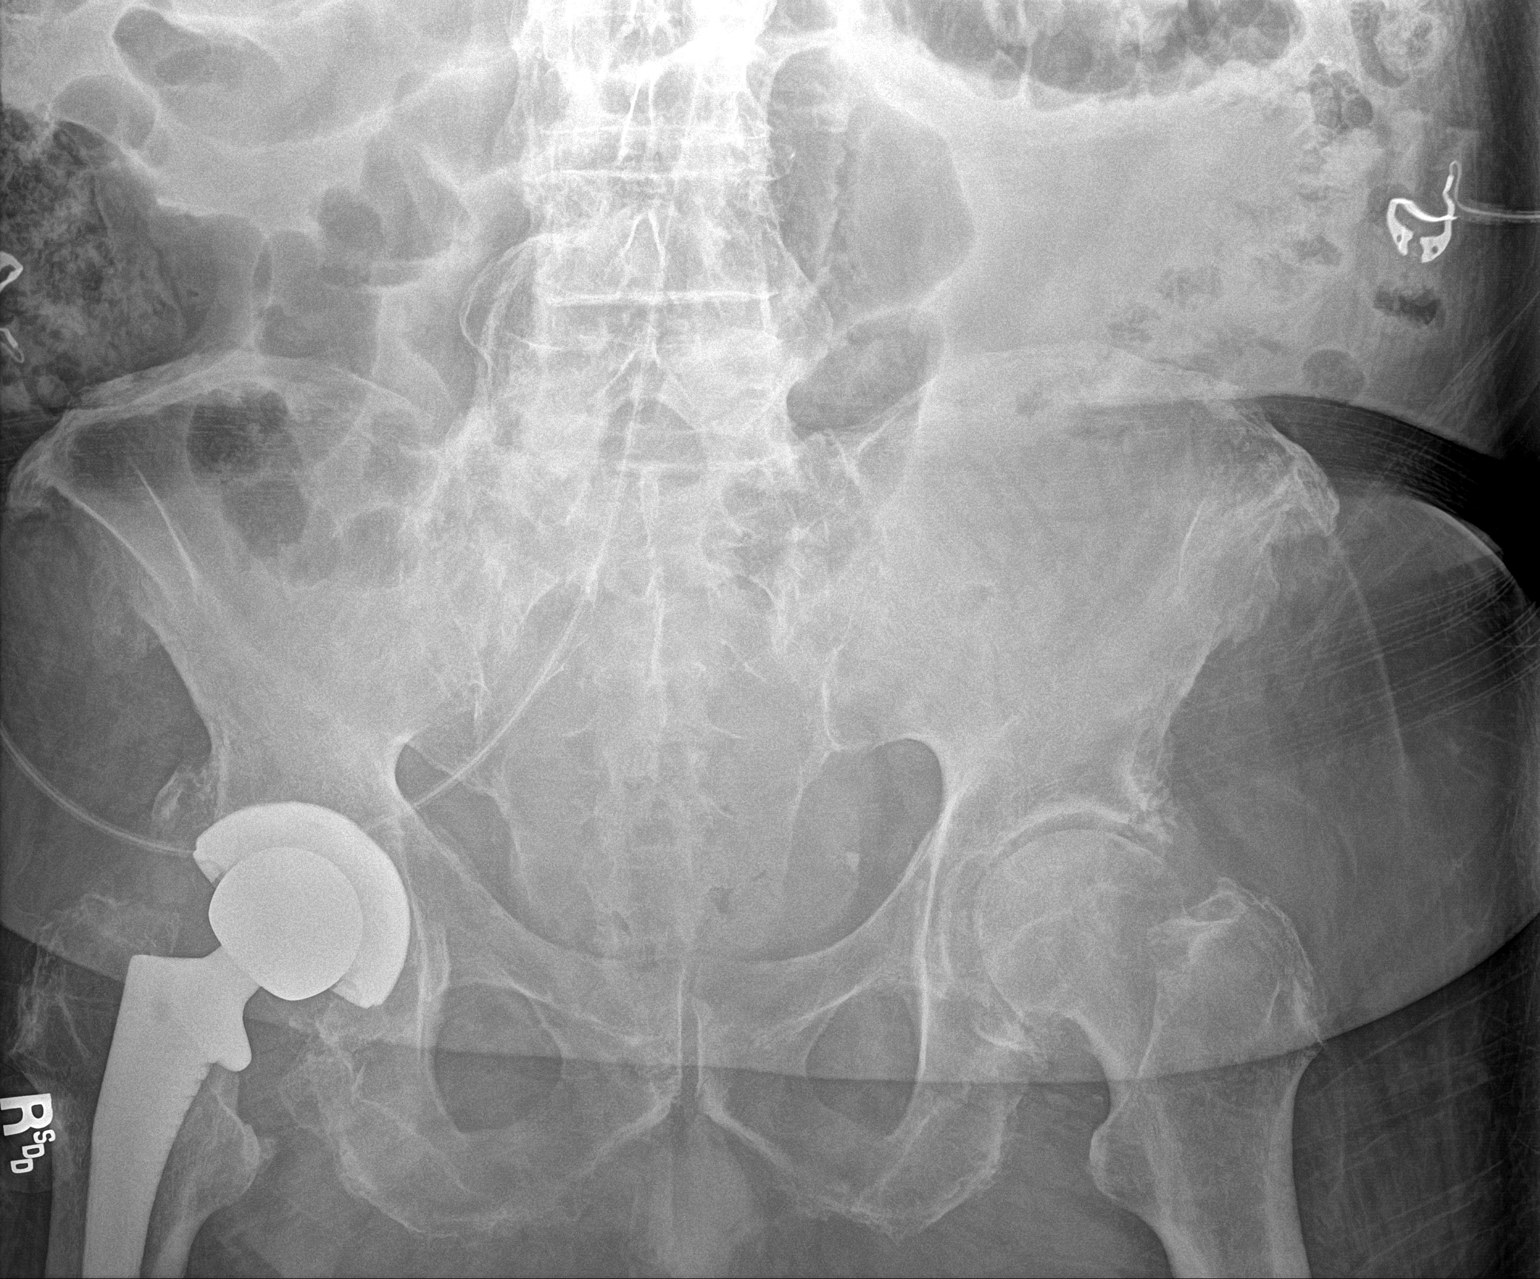

[1 of 1 positions shown; findings below may reference images not displayed]

FINDINGS: There is no evidence of pelvic fracture or diastasis. Prior right
total hip arthroplasty. Unchanged moderate left hip osteoarthritis.
IMPRESSION: No acute osseous abnormality.

## 2020-10-17 IMAGING — DX DG CHEST 1V PORT
2 series · 2 of 2 positions shown · non-contrast
Comparison: Chest x-ray dated January 13, 2019.

CLINICAL DATA: Fall.

EXAM:
PORTABLE CHEST 1 VIEW

[chest ap (1 of 2)]
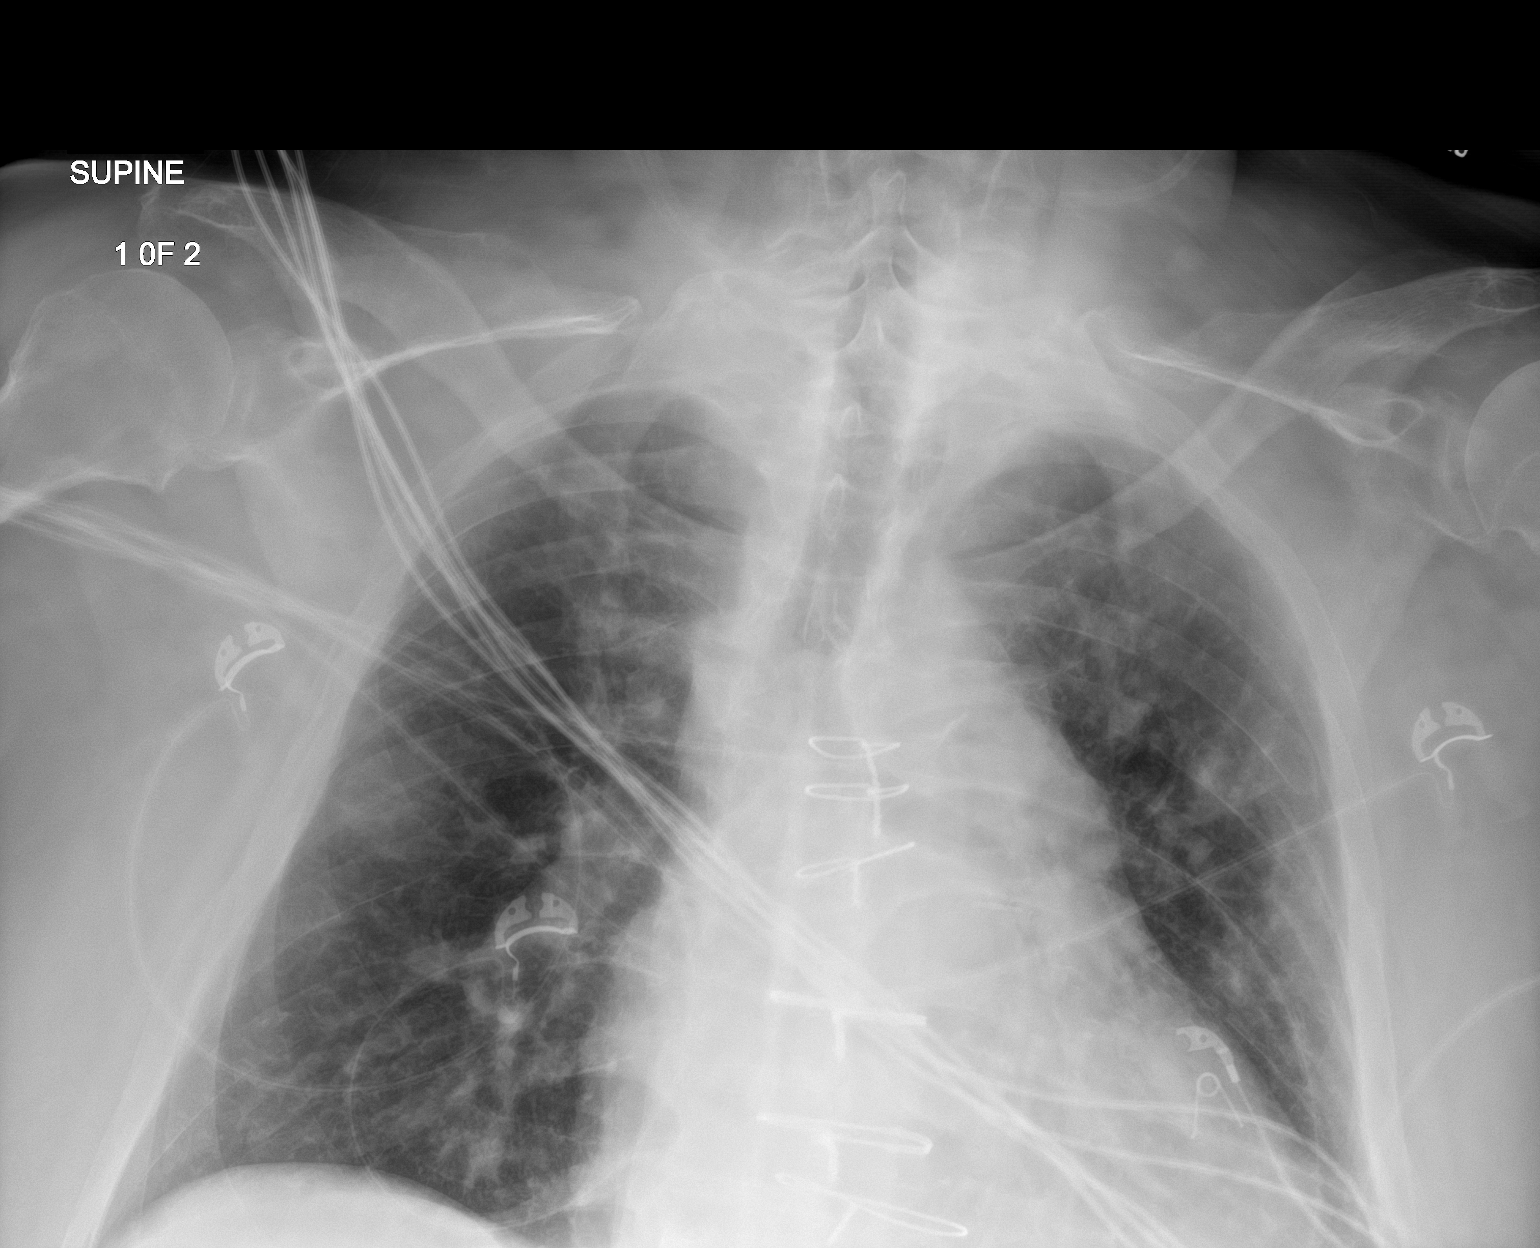

[chest ap (2 of 2)]
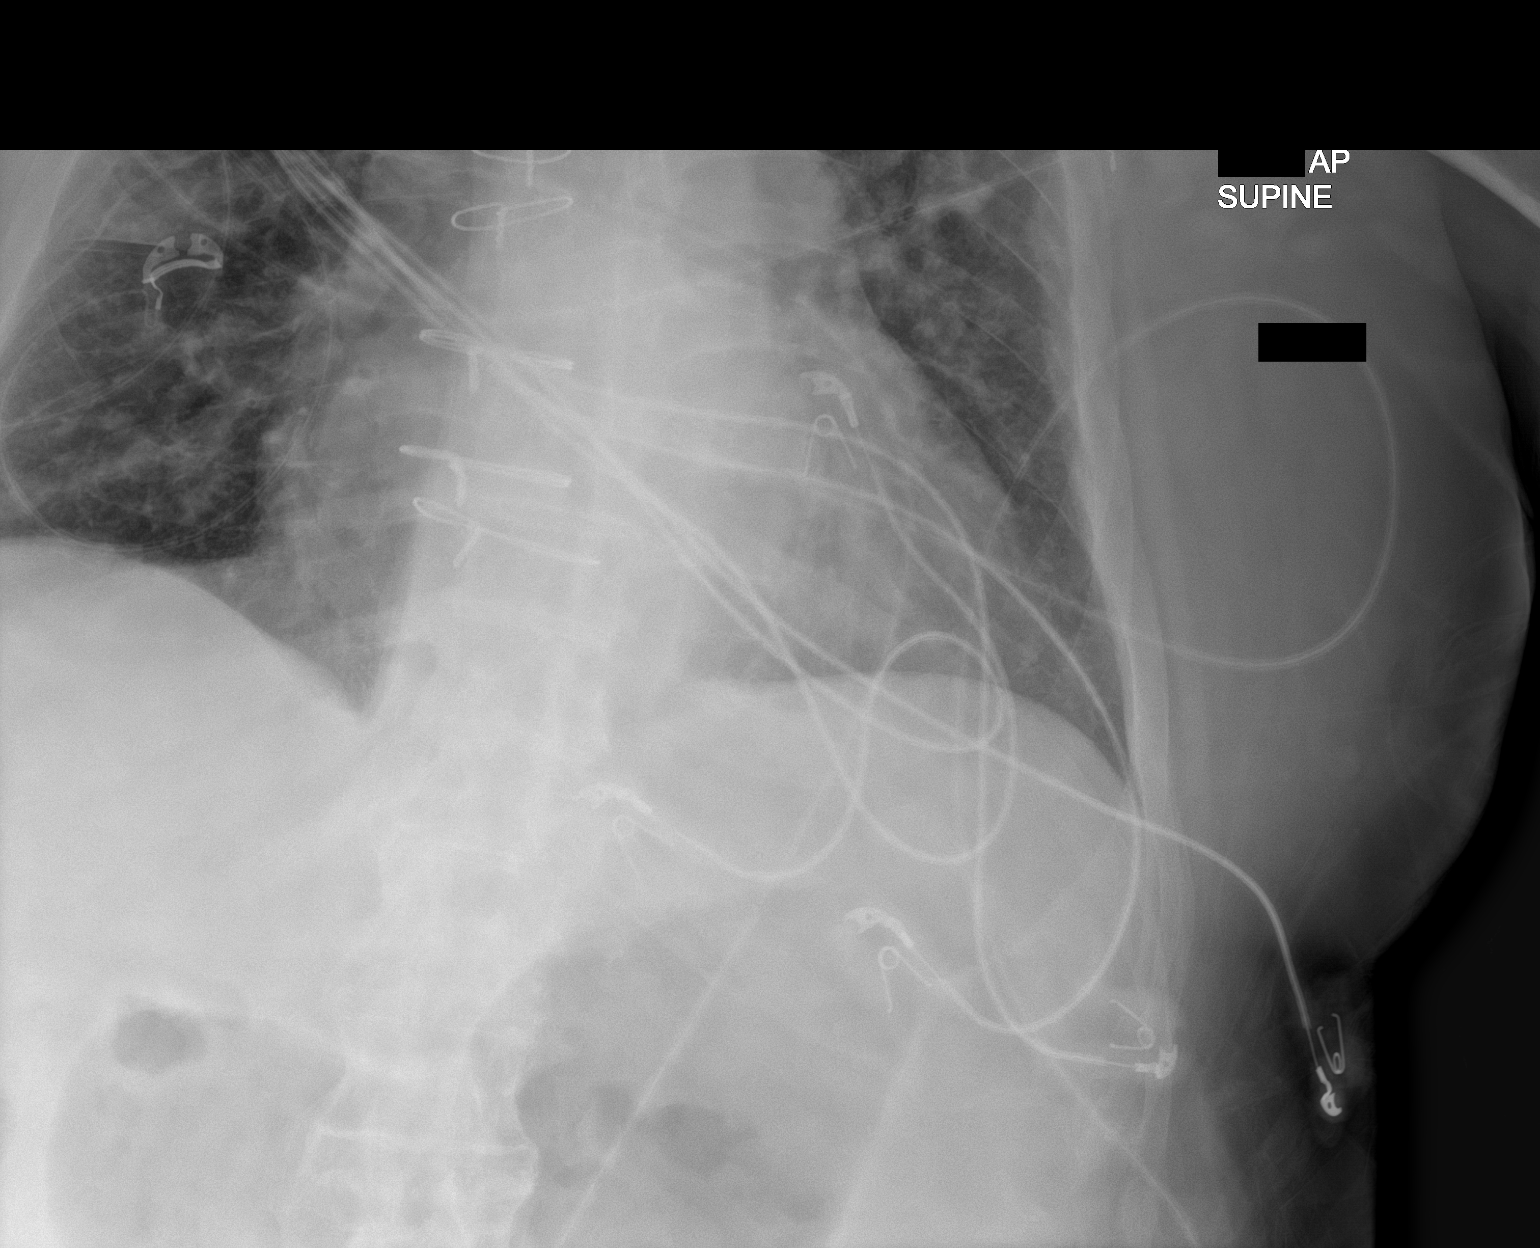

[2 of 2 positions shown; findings below may reference images not displayed]

FINDINGS: Stable cardiomegaly. Pulmonary vascular congestion. No focal
consolidation, pleural effusion, or pneumothorax. No acute osseous
abnormality. Old right-sided rib fractures again noted.
IMPRESSION: Cardiomegaly with pulmonary vascular congestion.

## 2020-10-21 IMAGING — DX DG CHEST 1V PORT
1 series · 1 of 1 positions shown · non-contrast
Comparison: Portable chest 05/06/2019 and earlier.

CLINICAL DATA: 68-year-old male positive for 174L6-VL. Fever.

EXAM:
PORTABLE CHEST 1 VIEW

[chest]
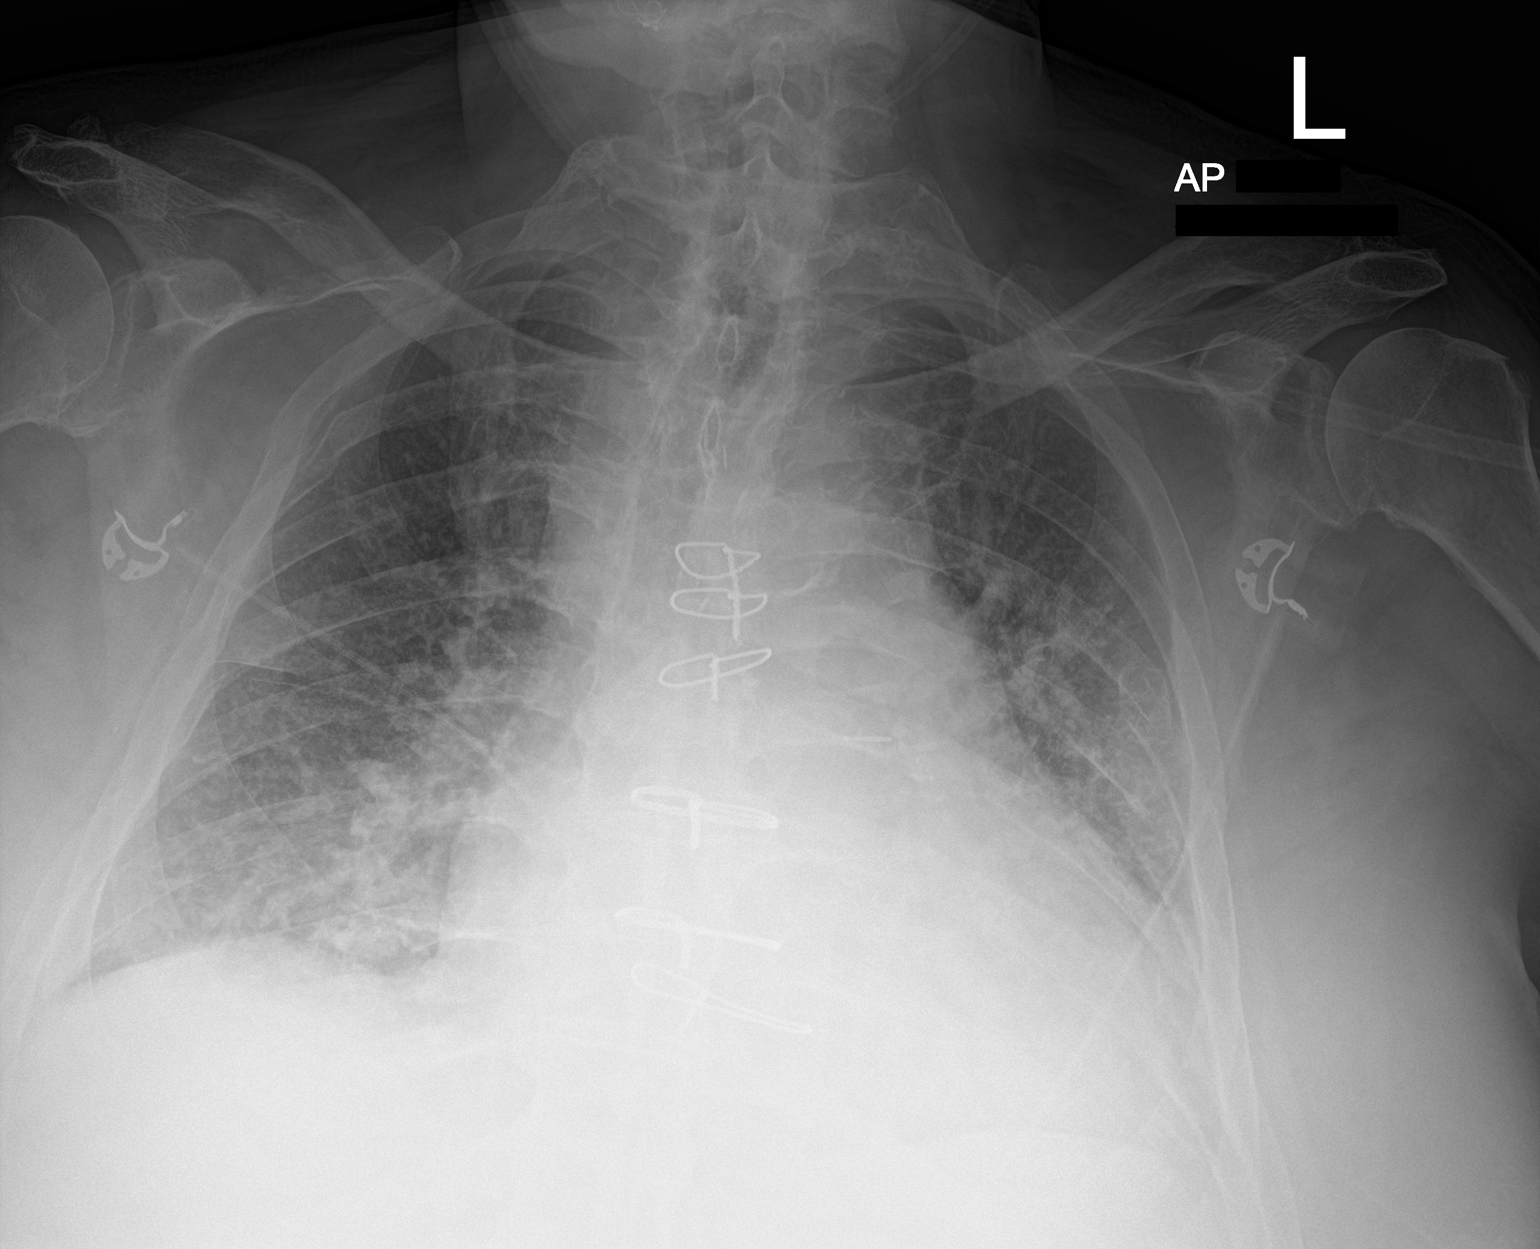

[1 of 1 positions shown; findings below may reference images not displayed]

FINDINGS: Portable AP semi upright view at 7767 hours. Chronic cardiomegaly.
Mildly lower lung volumes compared to [place]. New bilateral basilar
predominant confluent and indistinct pulmonary opacity. No
superimposed pneumothorax or pleural effusion. Prior sternotomy. No
acute osseous abnormality identified.
IMPRESSION: 1. New from 05/06/2019 bilateral basilar predominant bilateral
pulmonary opacity compatible with 174L6-VL pneumonia.
2. Chronic cardiomegaly.

## 2020-10-21 IMAGING — CT CT L SPINE W/O CM
3 of 6 series · 11 of 33 positions shown, 13 images · non-contrast
Comparison: 05/06/2019

CLINICAL DATA: Lumbar fracture.  Surgical planning.

EXAM:
CT LUMBAR SPINE WITHOUT CONTRAST
TECHNIQUE: Multidetector CT imaging of the lumbar spine was performed without
intravenous contrast administration. Multiplanar CT image
reconstructions were also generated.

[Series 5: l spine 2.0 i30s 3 · axial · 0.42mm/px · z∈[+1295,+1569]mm · 5 of 207 slices shown, 7 images]
[im 35/207  soft-tissue]
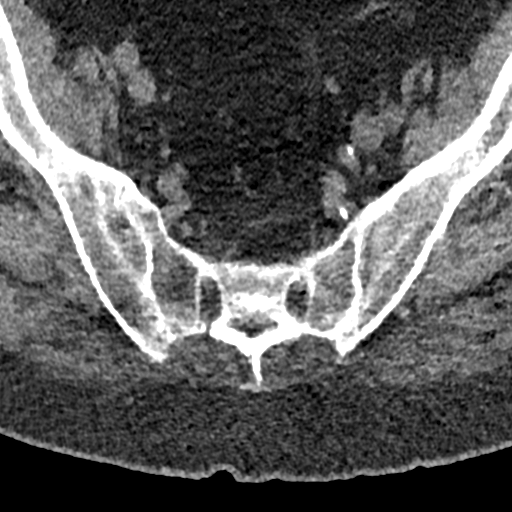
[im 35/207  bone]
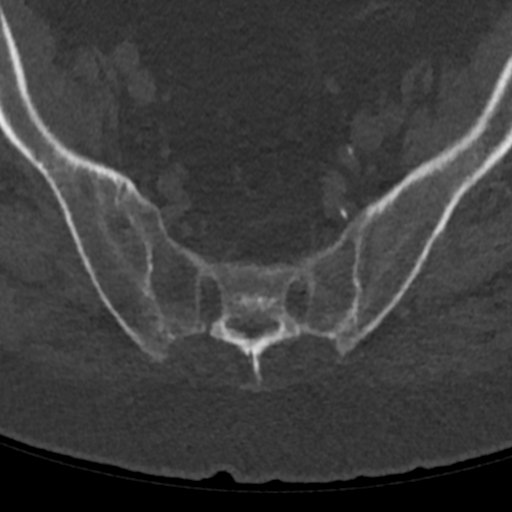
[im 69/207  bone]
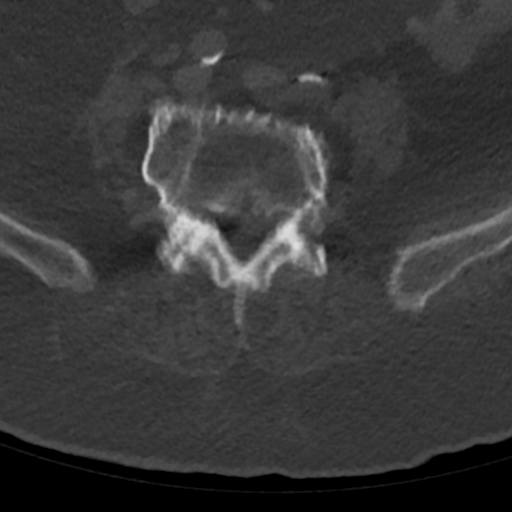
[im 104/207  bone]
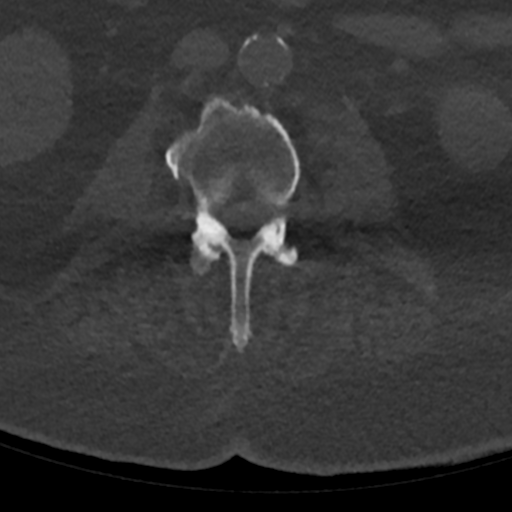
[im 138/207  bone]
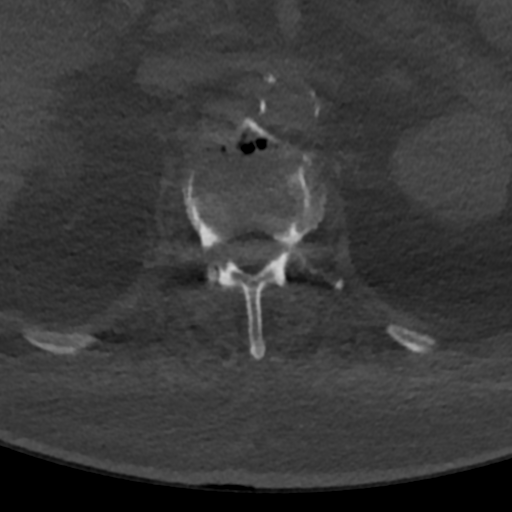
[im 172/207  soft-tissue]
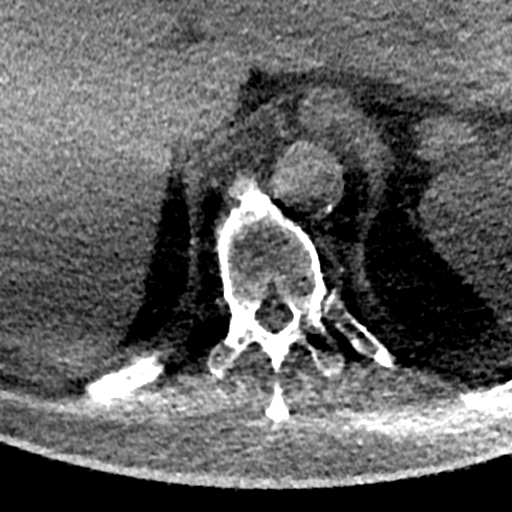
[im 172/207  bone]
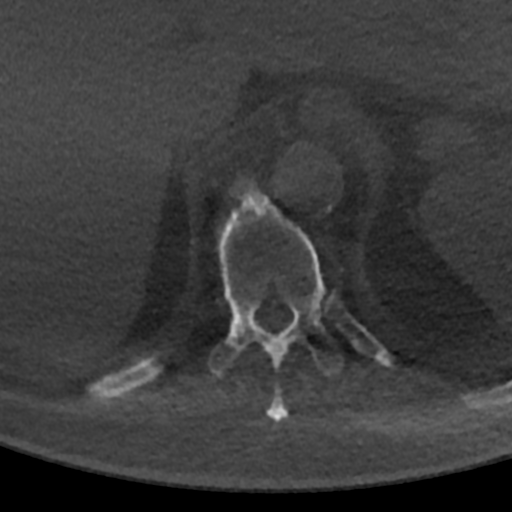

[Series 6: l spine 2.0 mpr st cor · coronal · 0.51mm/px · 1 of 101 slices shown]
[im 51/101  bone]
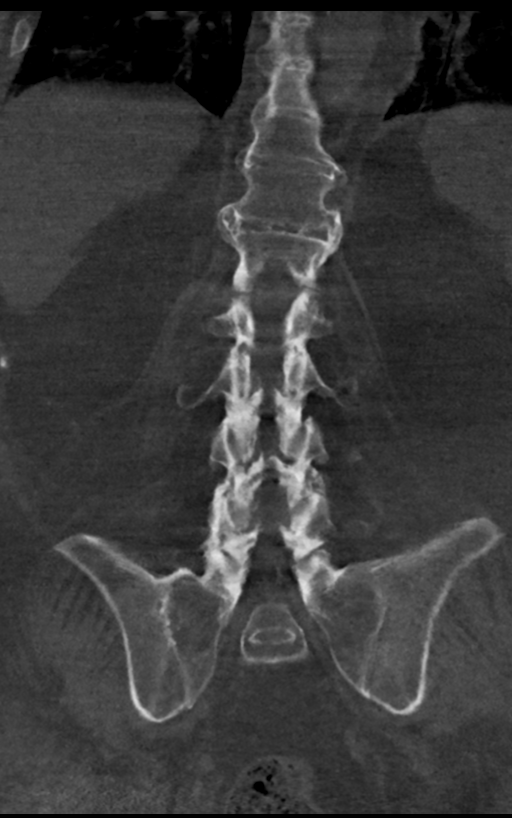

[Series 7: l spine 2.0 mpr st sag · sagittal · 0.44mm/px · 5 of 101 slices shown]
[im 17/101  bone]
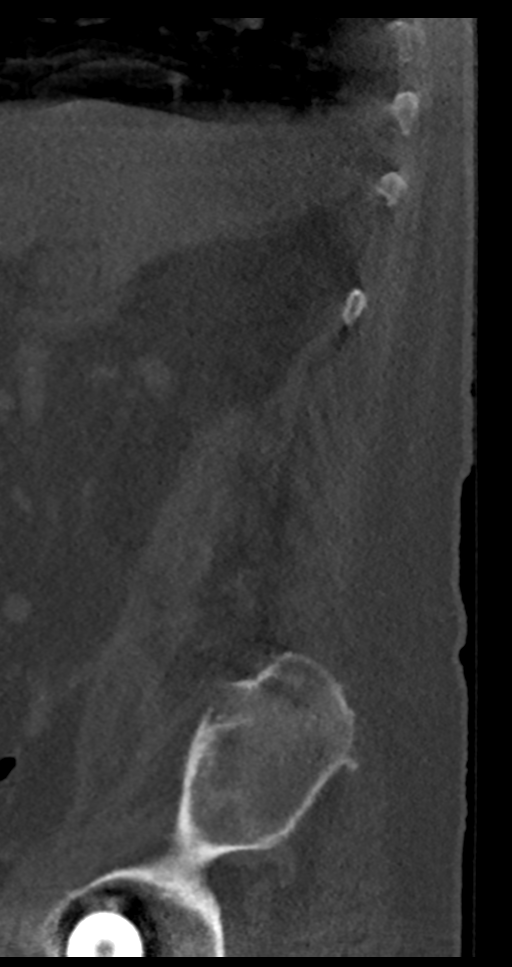
[im 34/101  bone]
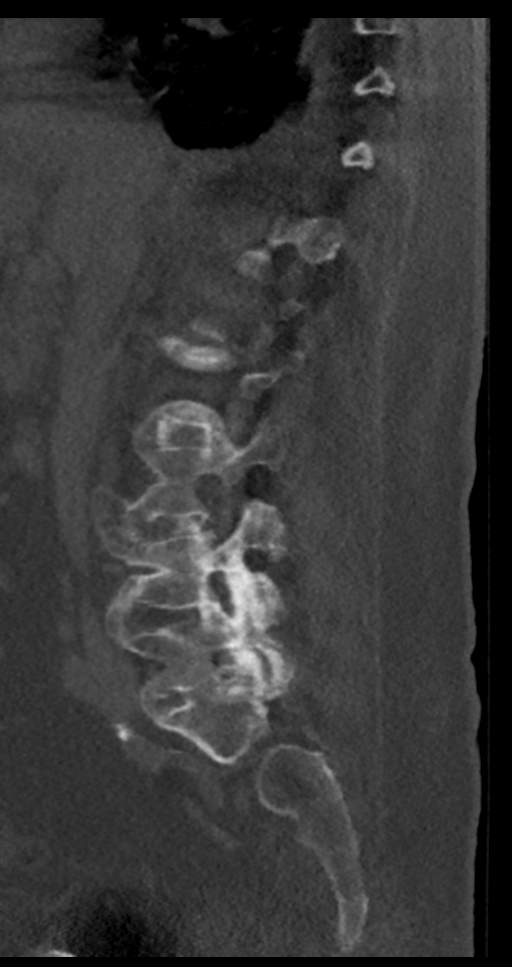
[im 51/101  bone]
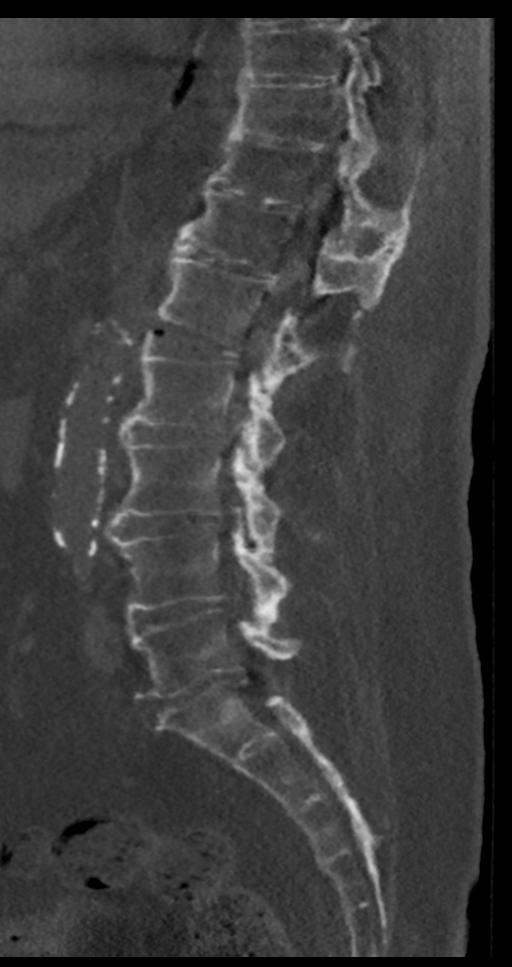
[im 67/101  bone]
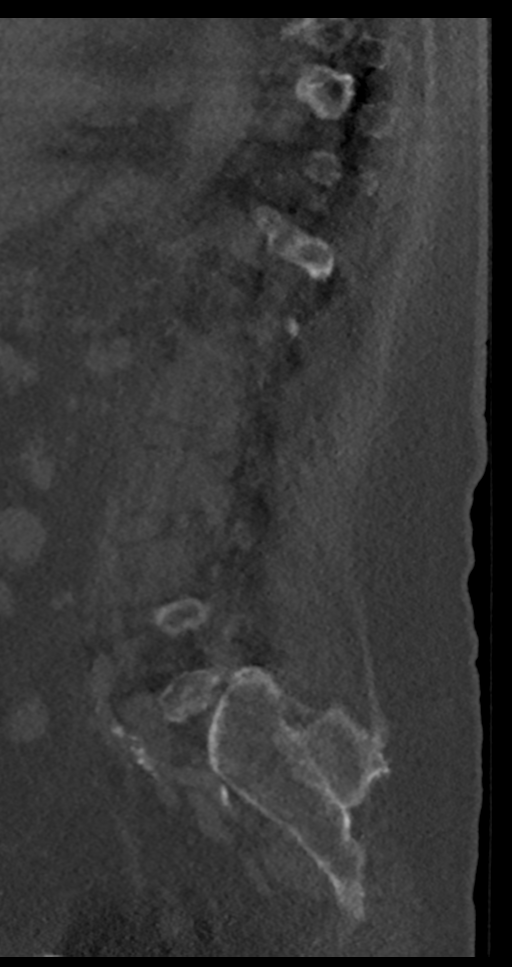
[im 84/101  bone]
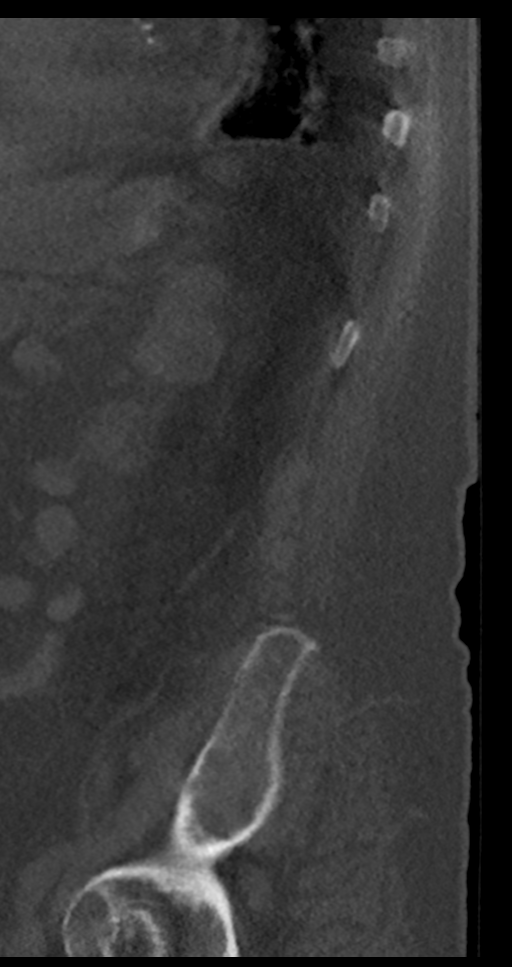

[11 of 33 positions shown; findings below may reference images not displayed]

FINDINGS: The study is mildly motion degraded despite repeat imaging.

Segmentation: 5 lumbar type vertebrae.

Alignment: Trace retrolisthesis of L5 on S1.

Vertebrae: Diffuse ankylosis is again noted throughout the lumbar
and included lower thoracic spine as well as across both SI joints.
A transverse fracture is again seen through the L1-2 disc space
including across the bridging osteophytes/syndesmophytes. Posterior
element extension was better demonstrated on the prior study due to
motion and image noise on today's examination. Anterior widening of
the L1-2 disc space is unchanged. No new fracture is identified.

Paraspinal and other soft tissues: Small pleural effusions and
dependent atelectasis in the lung bases. Aortic atherosclerosis.
Partially visualized right hip arthroplasty.

Disc levels: Disc space narrowing greatest at L5-S1 where endplate
and facet spurring result in severe bilateral neural foraminal
stenosis.
IMPRESSION: 1. Lumbar spine CT for surgical planning.
2. Findings suggestive of ankylosing spondylitis with fracture
through the L1-2 disc space as previously described. Unchanged
anterior disc space widening.
3.  Aortic Atherosclerosis ([id number]).

## 2020-10-26 IMAGING — DX DG CHEST 1V PORT
1 series · 1 of 1 positions shown · non-contrast
Comparison: None.

CLINICAL DATA: 68-year-old male with a history of PICC placement

EXAM:
PORTABLE CHEST 1 VIEW

[chest ap]
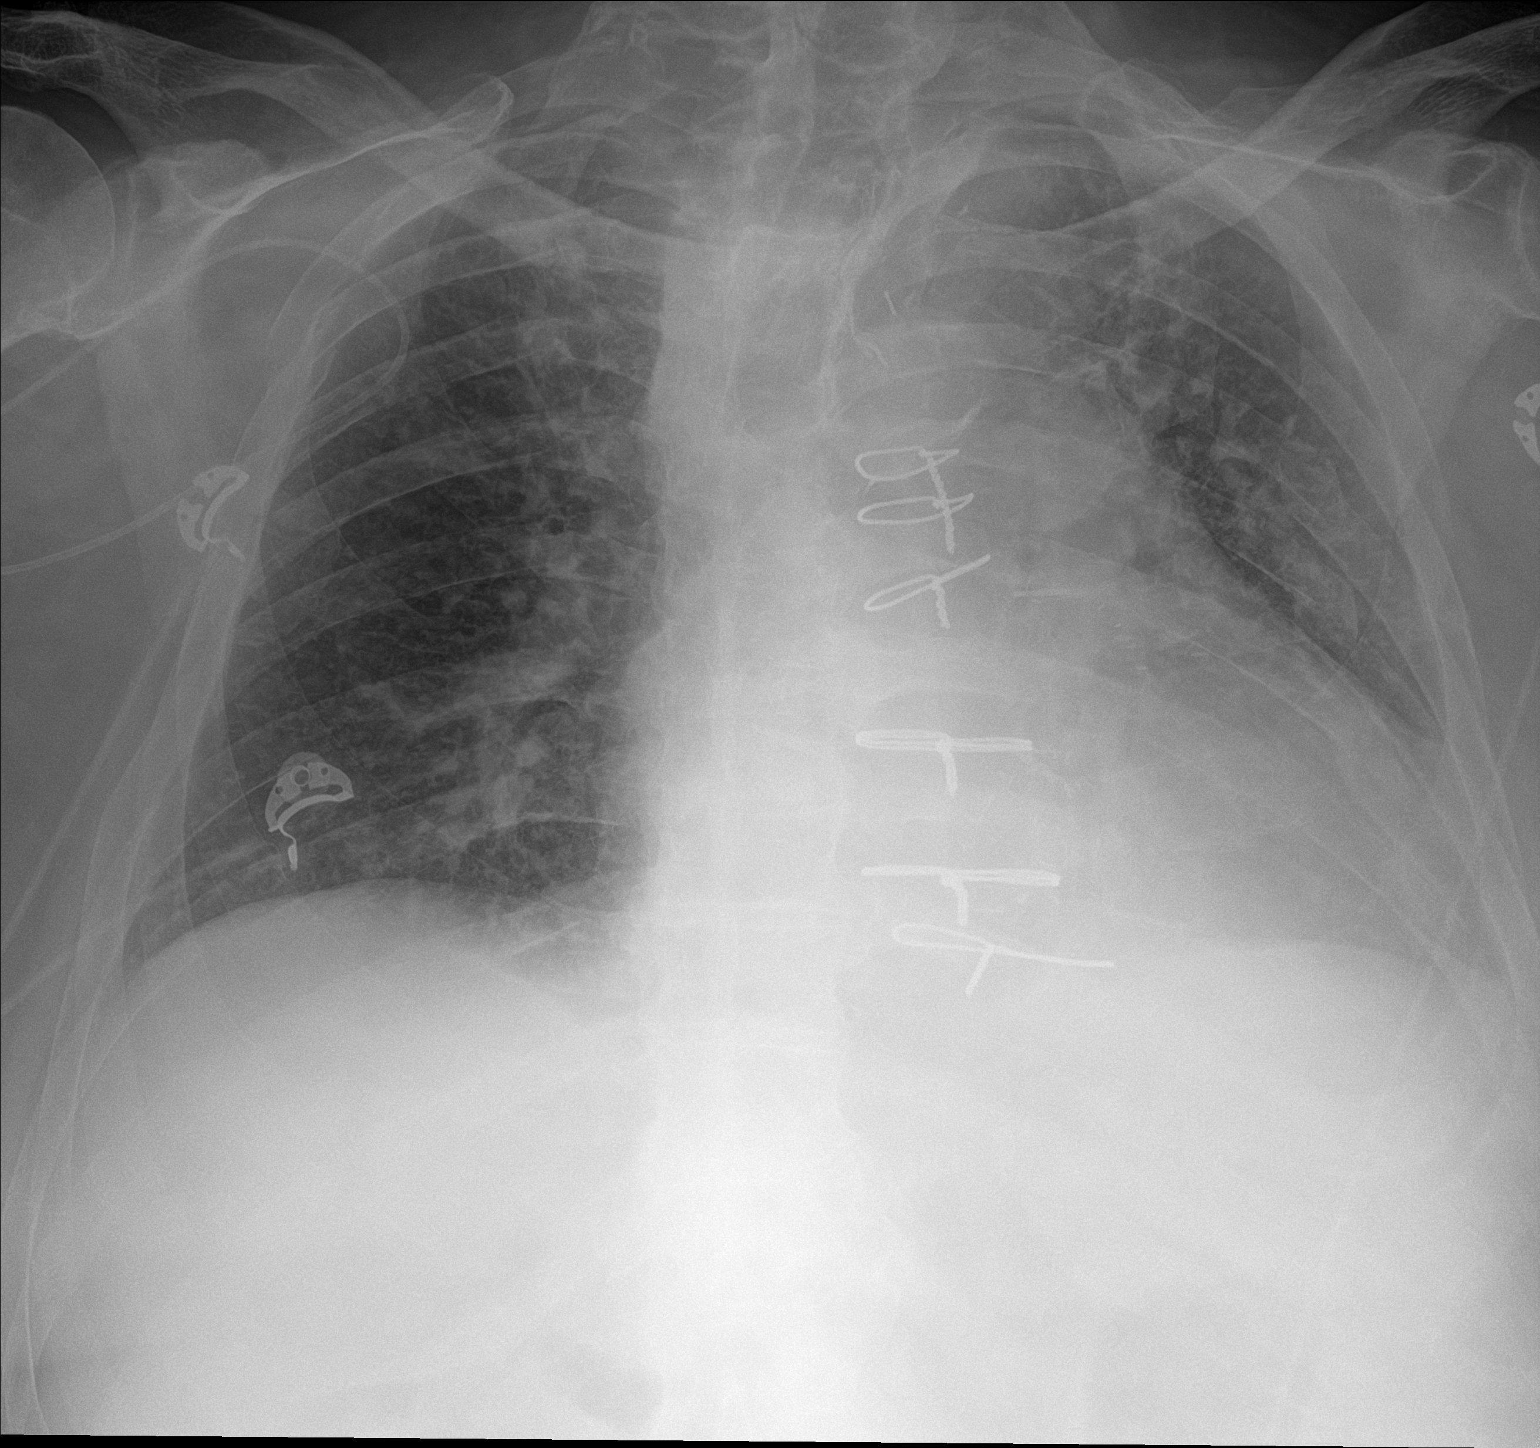

[1 of 1 positions shown; findings below may reference images not displayed]

FINDINGS: Cardiomediastinal silhouette unchanged in size and contour with
surgical changes of median sternotomy and cardiomegaly.

No pneumothorax. No pleural effusion. Coarsened interstitial
markings throughout.

Right upper extremity PICC is redundant, without visualization of
the point of termination of the tip.

Redemonstration of partially healed right-sided rib fractures
IMPRESSION: Right upper extremity PICC is redundant within the veins in the tip
is not visualized. Repositioning is indicated.

Similar appearance of the lungs with diffuse reticulonodular
opacities.

Similar appearance of surgical changes of median sternotomy and
cardiomegaly.

## 2020-10-26 IMAGING — DX DG ABDOMEN 1V
1 series · 1 of 1 positions shown · non-contrast
Comparison: CT abdomen pelvis 04/11/2018.

CLINICAL DATA: Evaluate for constipation.

EXAM:
ABDOMEN - 1 VIEW

[abdomen kub]
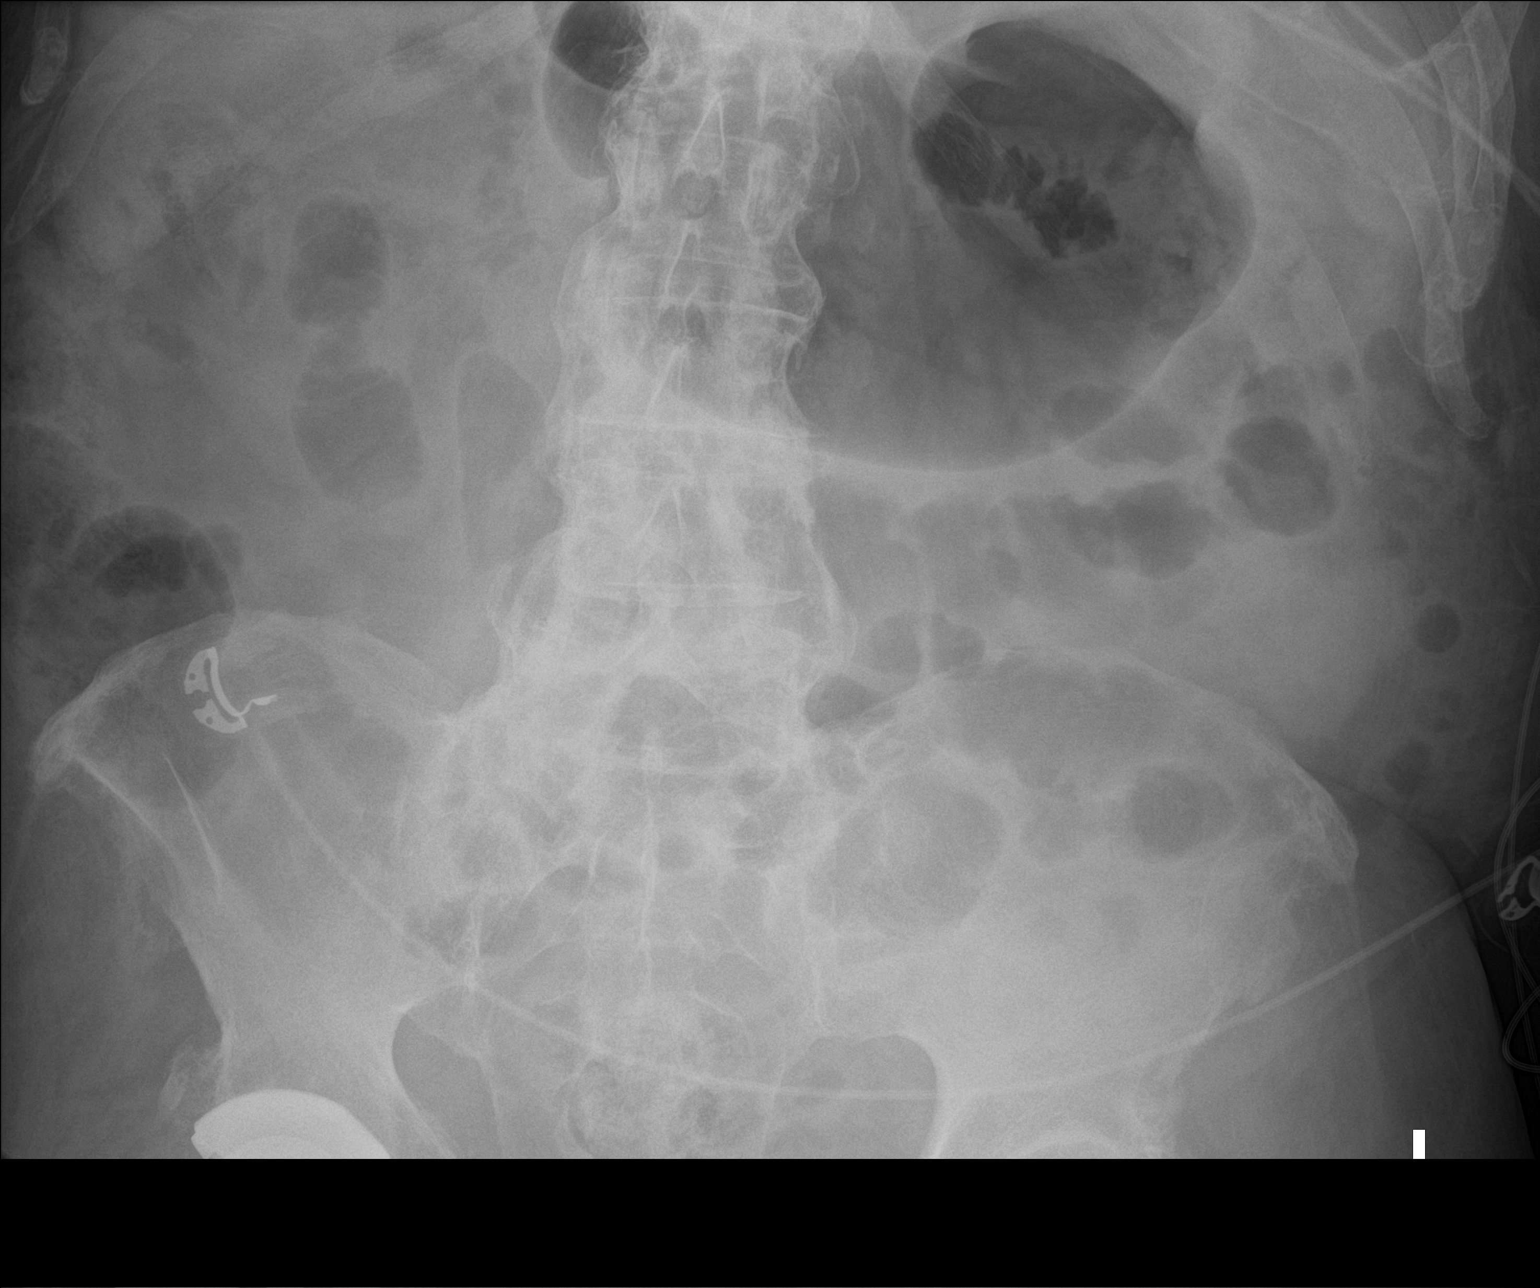

[1 of 1 positions shown; findings below may reference images not displayed]

FINDINGS: Single AP supine radiograph of the abdomen through the pubic
symphysis. The lung bases are excluded from field of view.

Gastric distention. There are no dilated loops of bowel to suggest
obstruction. Mild stool burden. No supine evidence for free air.
Status post right hip arthroplasty, incompletely visualized.
IMPRESSION: Gastric distention. Nonobstructive bowel gas pattern. Mild stool
burden.

## 2020-10-28 IMAGING — XA IR PICC >5YO
1 series · 1 of 1 positions shown · IV contrast (agent unspecified)
Comparison: none

INDICATION: BACTEREMIA, ACCESS FOR LONG-TERM ANTIBIOTICS

EXAM:
ULTRASOUND AND FLUOROSCOPIC GUIDED PICC LINE INSERTION
MEDICATIONS:
1% LIDOCAINE LOCAL
CONTRAST:  None
FLUOROSCOPY TIME:  120 seconds (12 mGy)
COMPLICATIONS:
None immediate.
TECHNIQUE: The procedure, risks, benefits, and alternatives were explained to
the patient's family and informed written consent was obtained. A
timeout was performed prior to the initiation of the procedure.

[Series 2: fl (-) angio · 1 of 1 slices shown]
[im 1/1]
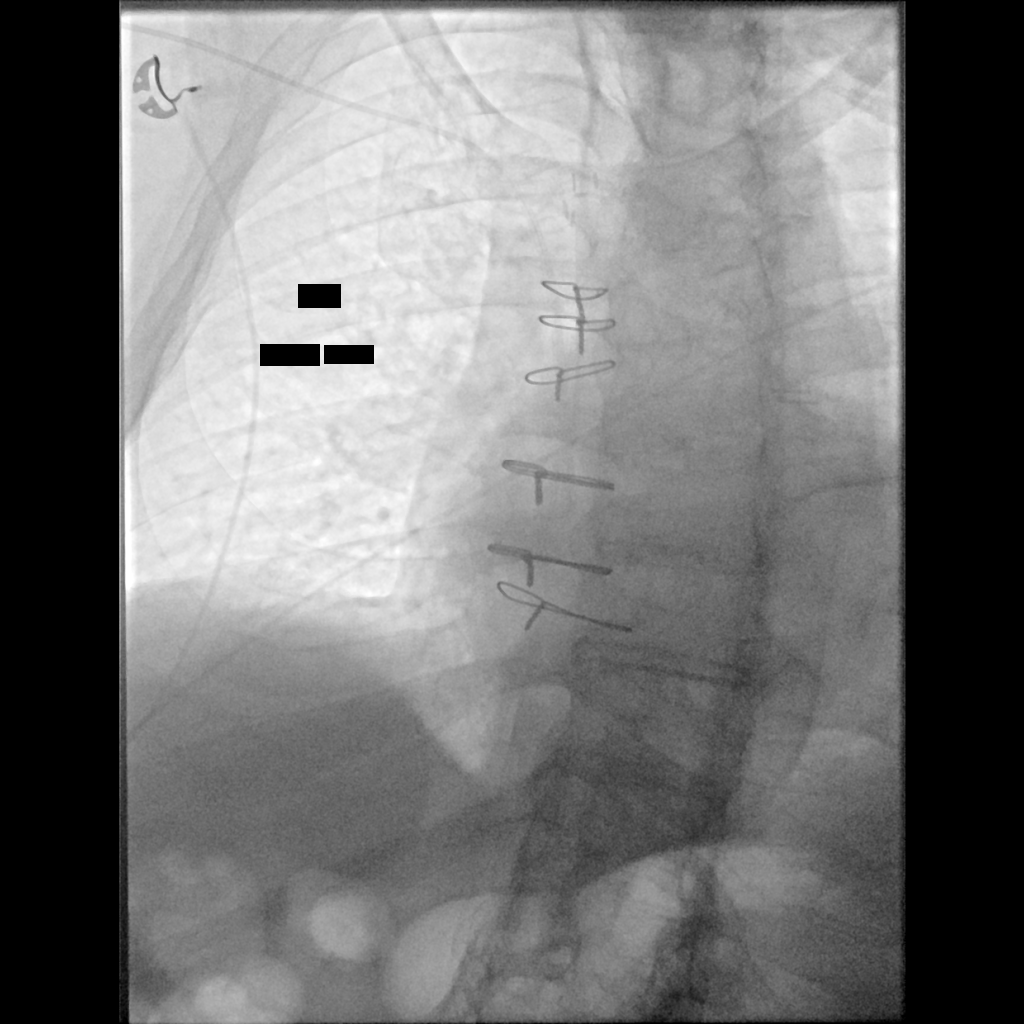

[1 of 1 positions shown; findings below may reference images not displayed]

The right upper extremity was prepped with chlorhexidine in a
sterile fashion, and a sterile drape was applied covering the
operative field. Maximum barrier sterile technique with sterile
gowns and gloves were used for the procedure. A timeout was
performed prior to the initiation of the procedure. Local anesthesia
was provided with 1% lidocaine.

Under direct ultrasound guidance, the right brachial vein was
accessed with a micropuncture kit after the overlying soft tissues
were anesthetized with 1% lidocaine. An ultrasound image was saved
for documentation purposes. A guidewire was advanced to the level of
the superior caval-atrial junction for measurement purposes and the
PICC line was cut to length. A peel-away sheath was placed and a 45
cm, 5 French, dual lumen was inserted to level of the superior
caval-atrial junction. A post procedure spot fluoroscopic was
obtained. The catheter easily aspirated and flushed and was sutured
in place. A dressing was placed. The patient tolerated the procedure
well without immediate post procedural complication.
FINDINGS: After catheter placement, the tip lies within the superior
cavoatrial junction. The catheter aspirates and flushes normally and
is ready for immediate use.
IMPRESSION: Successful ultrasound and fluoroscopic guided placement of a right
brachial vein approach, 45 cm, 5 French, dual lumen PICC with tip at
the superior caval-atrial junction. The PICC line is ready for
immediate use.

## 2020-10-29 IMAGING — DX DG CHEST 1V PORT
1 series · 1 of 1 positions shown · non-contrast
Comparison: May 15, 2019.

CLINICAL DATA: Pneumonia.

EXAM:
PORTABLE CHEST 1 VIEW

[chest]
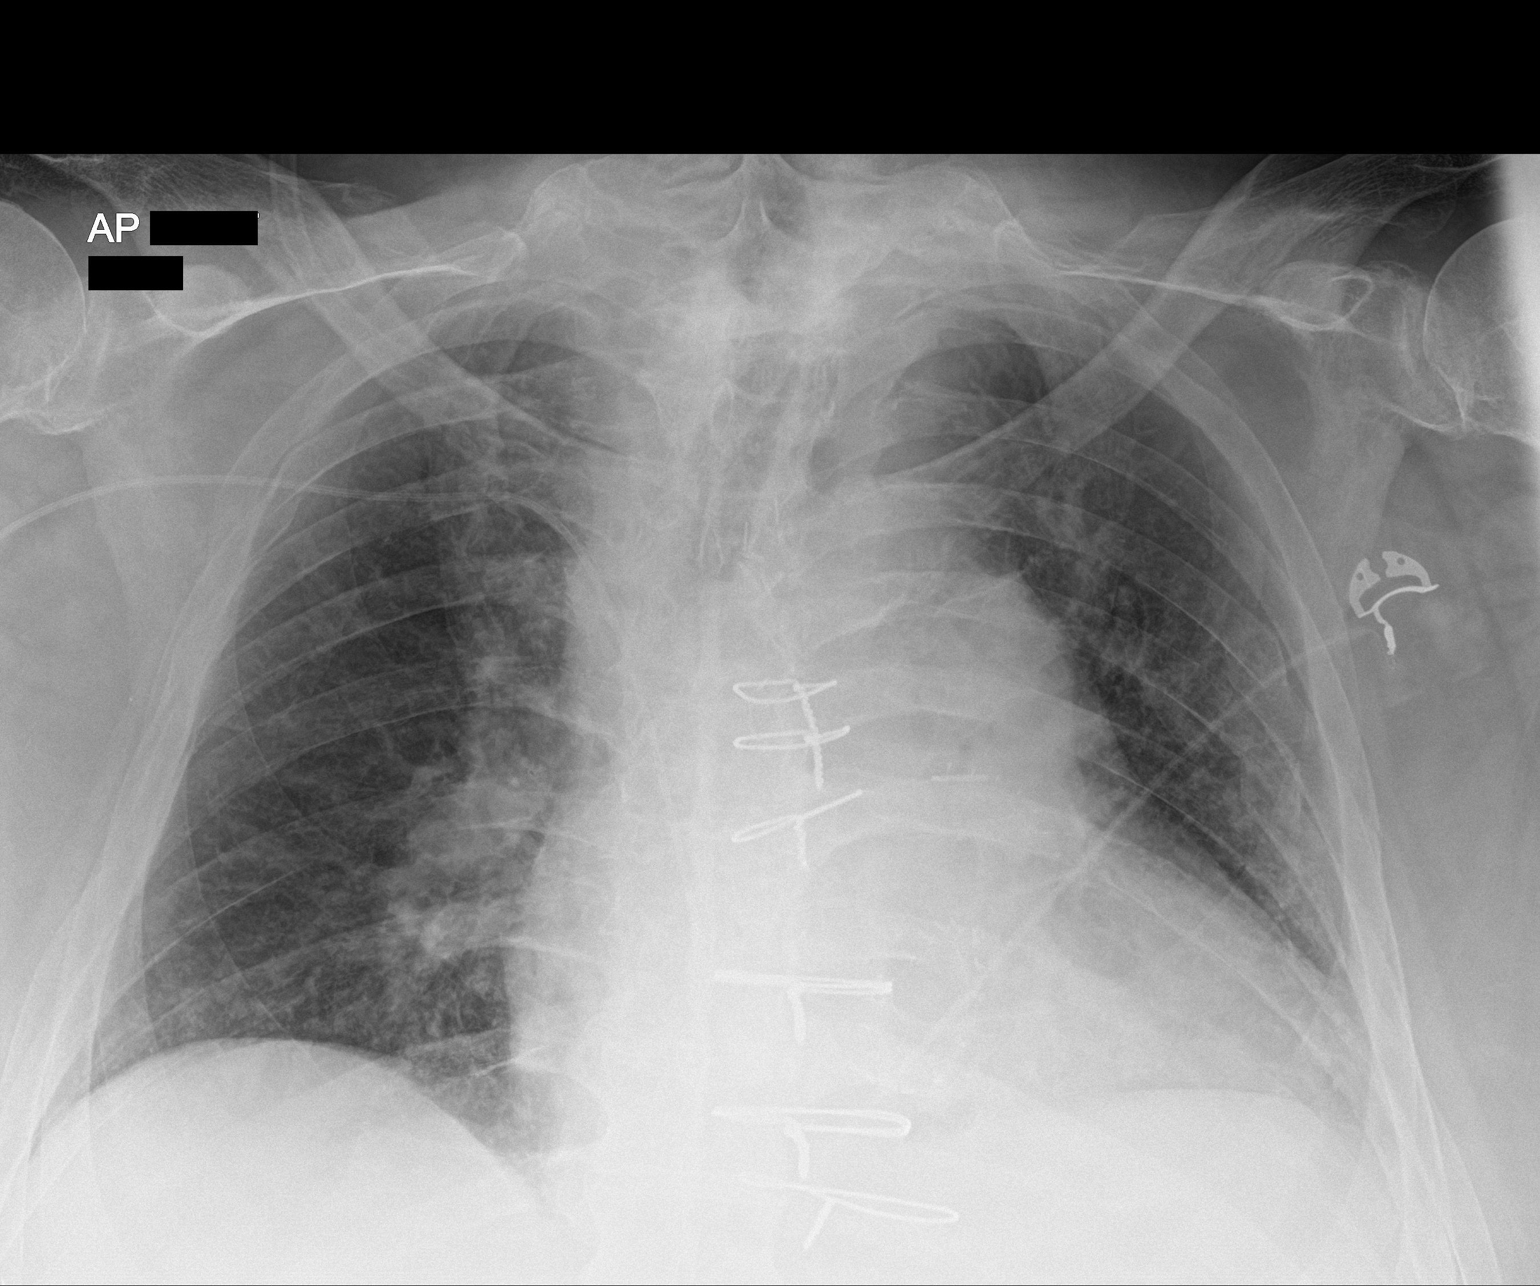

[1 of 1 positions shown; findings below may reference images not displayed]

FINDINGS: Stable cardiomegaly. No pneumothorax or pleural effusion is noted.
Right-sided PICC line is noted with distal tip in expected position
of cavoatrial junction. No acute pulmonary disease is noted. Bony
thorax is unremarkable.
IMPRESSION: Right-sided PICC line is been repositioned with tip in expected
position of cavoatrial junction. No acute cardiopulmonary
abnormality seen.

## 2020-11-01 IMAGING — DX DG CHEST 1V PORT
1 series · 1 of 1 positions shown · non-contrast
Comparison: May 18, 2019

CLINICAL DATA: Dyspnea and aspiration

EXAM:
PORTABLE CHEST 1 VIEW

[chest ap]
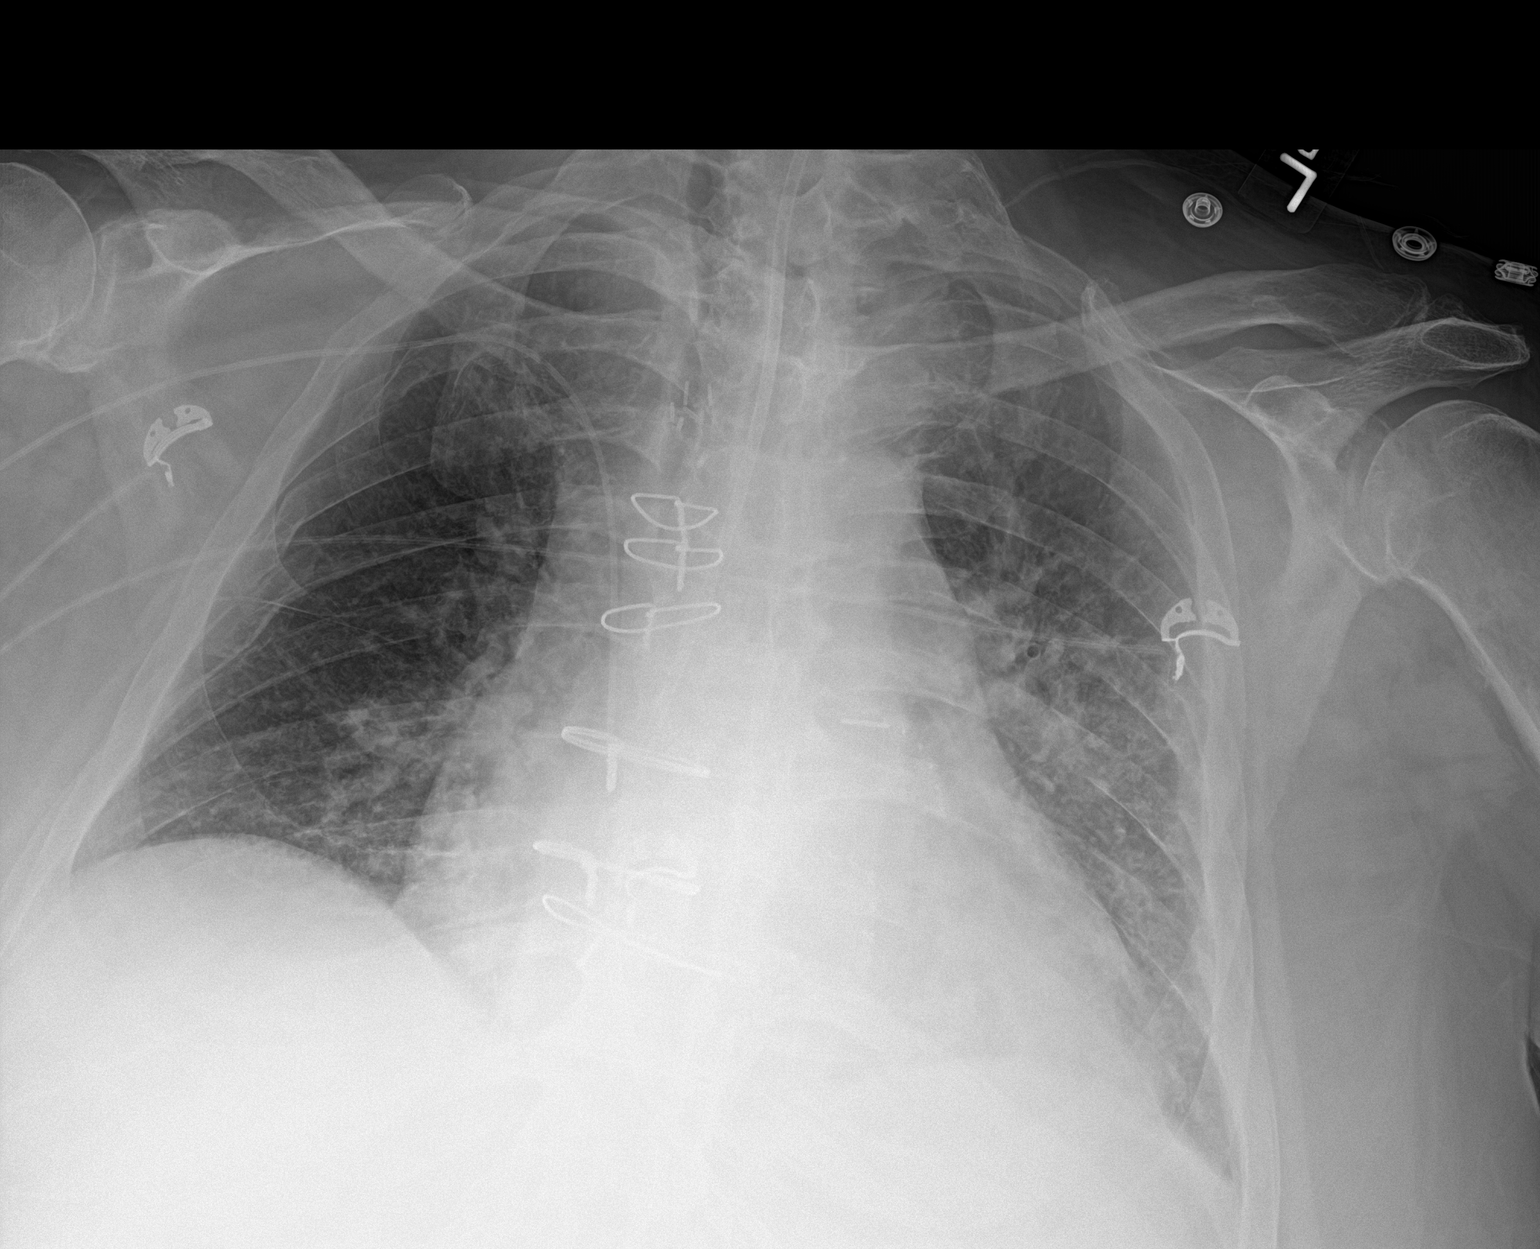

[1 of 1 positions shown; findings below may reference images not displayed]

FINDINGS: The mediastinal contour and cardiac silhouette are stable. A feeding
tube is identified with distal tip not included on film. Right
central venous line is identified with distal tip in the superior
vena cava. The heart size is enlarged. Increased pulmonary
interstitium is identified bilaterally. There is no focal pneumonia
or pleural effusion. The bony structures are stable.
IMPRESSION: Cardiomegaly with mild interstitial edema.

## 2020-11-05 IMAGING — RF DG C-ARM 1-60 MIN
1 series · 2 of 2 positions shown · non-contrast
Comparison: None.

CLINICAL DATA: Lumbar fracture fixation

EXAM:
DG C-ARM 1-60 MIN
FLUOROSCOPY TIME:  Fluoroscopy Time:  2 minutes 35 seconds
Number of Acquired Spot Images: 0

[Series 1: run · 2 of 2 slices shown]
[im 1/2]
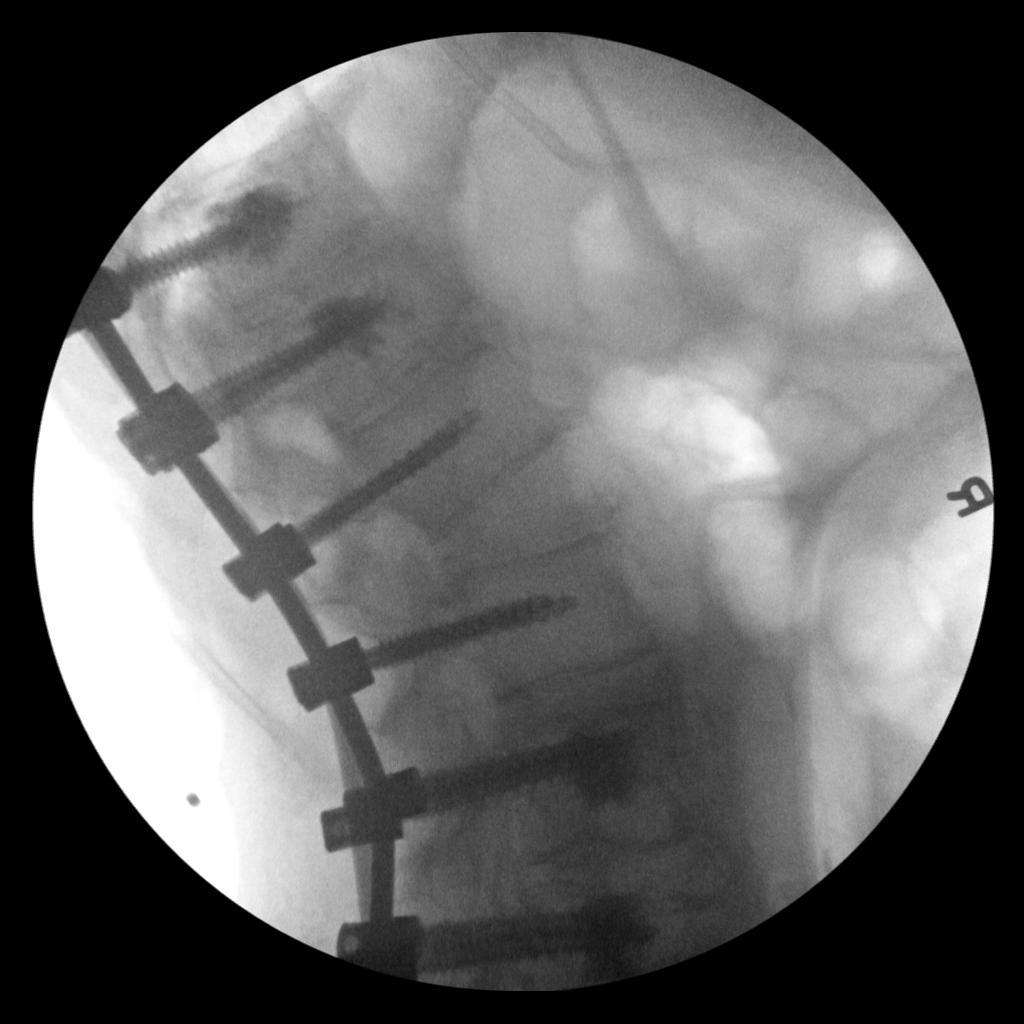
[im 2/2]
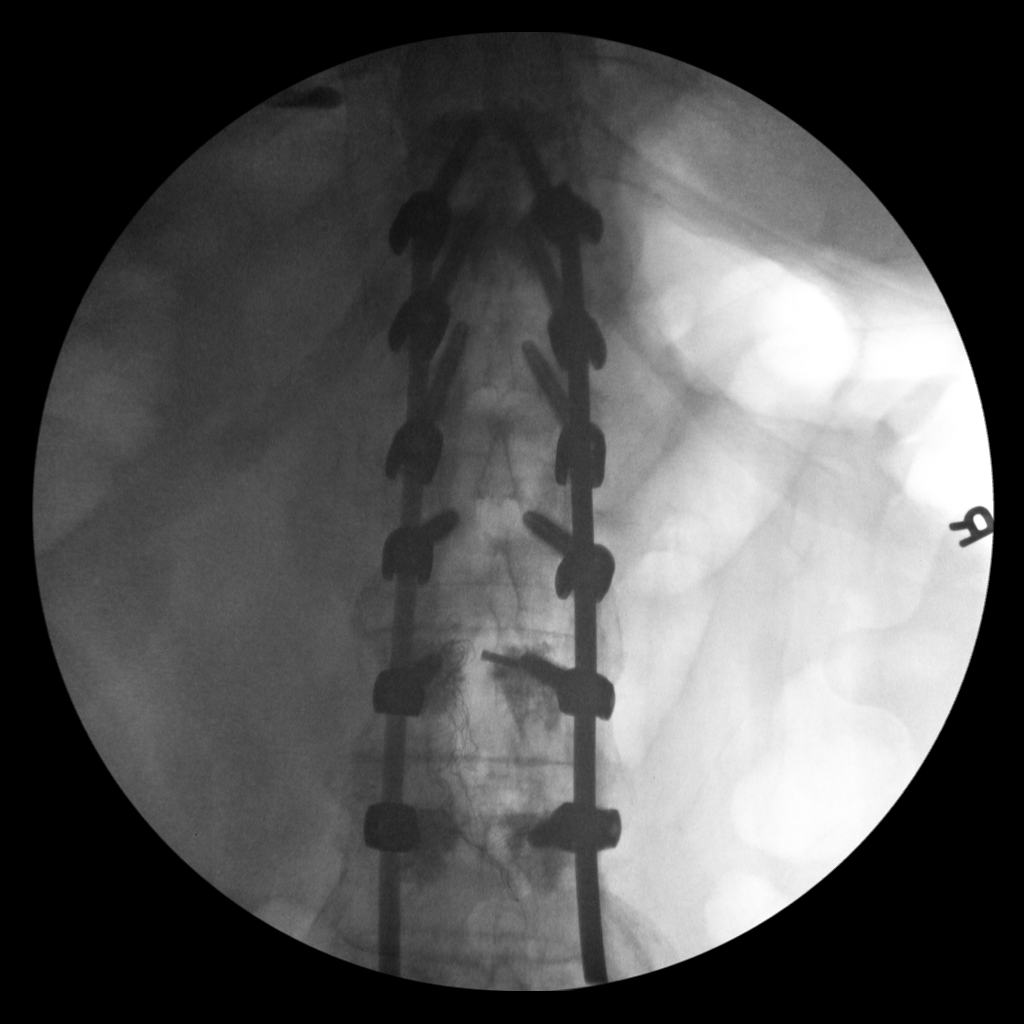

[2 of 2 positions shown; findings below may reference images not displayed]

FINDINGS: Rod and pedicle screw fixation hardware spanning T11-L4.
IMPRESSION: Fluoroscopic guidance for posterior lumbar fusion. Refer to
operative report for additional information.

## 2020-11-05 IMAGING — RF DG LUMBAR SPINE 2-3V
1 series · 2 of 2 positions shown · non-contrast
Comparison: None.

CLINICAL DATA: T11-L4 instrumentation

EXAM:
LUMBAR SPINE - 2-3 VIEW

[Series 1: run · 2 of 2 slices shown]
[im 1/2]
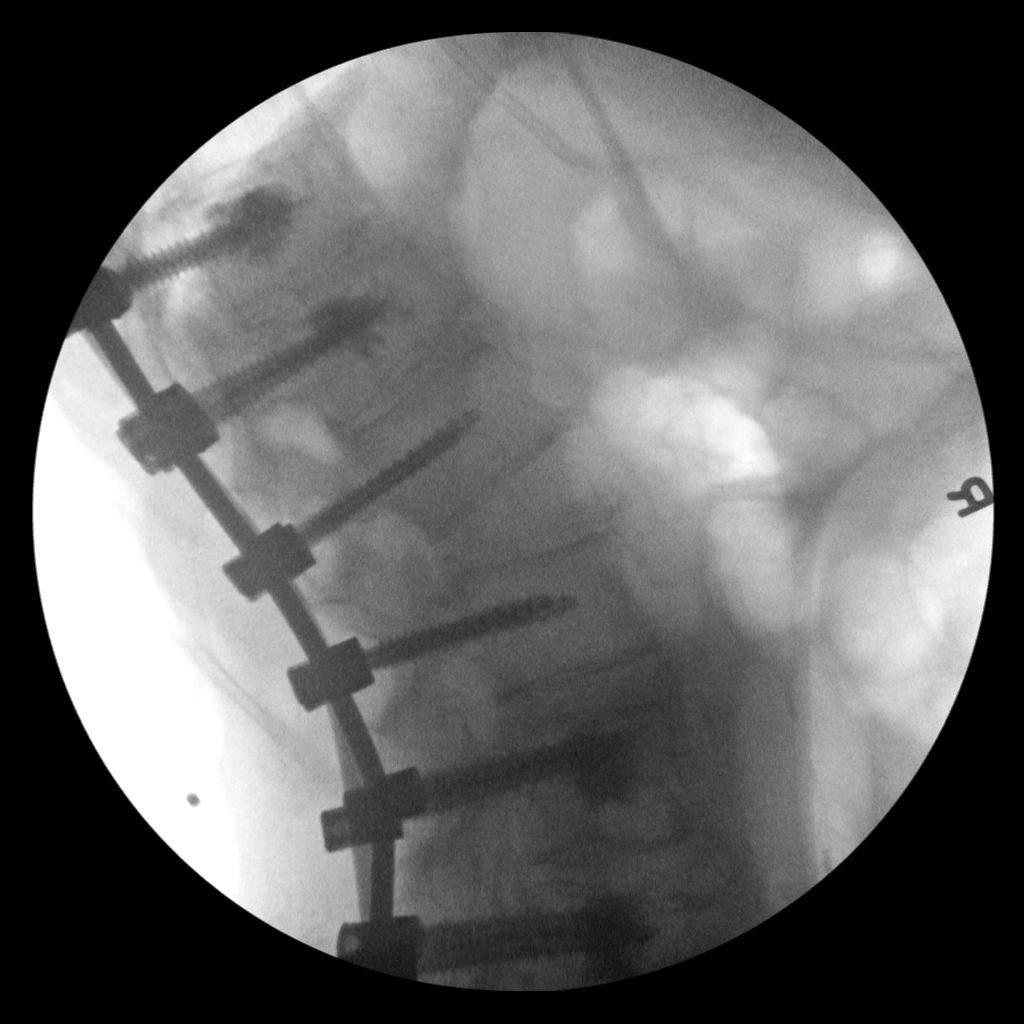
[im 2/2]
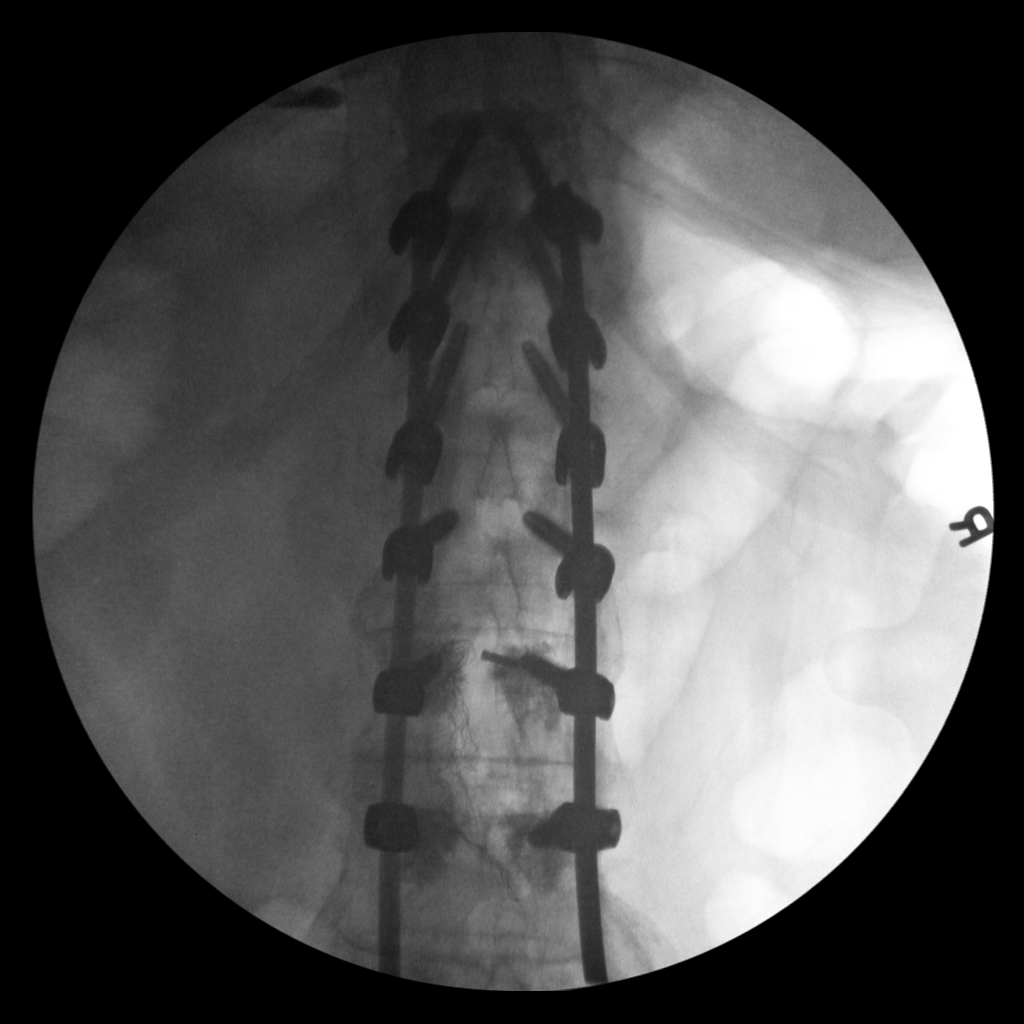

[2 of 2 positions shown; findings below may reference images not displayed]

FINDINGS: Multiple intraoperative fluoroscopic spot images are provided.
Posterior lumbar fusion from T11 through L4.

FLUOROSCOPY TIME:  2 minutes 35 seconds
IMPRESSION: Intraoperative localization.

## 2020-11-08 IMAGING — DX DG CHEST 1V PORT
1 series · 1 of 1 positions shown · non-contrast
Comparison: 05/27/2019

CLINICAL DATA: Respiratory failure

EXAM:
PORTABLE CHEST 1 VIEW

[chest]
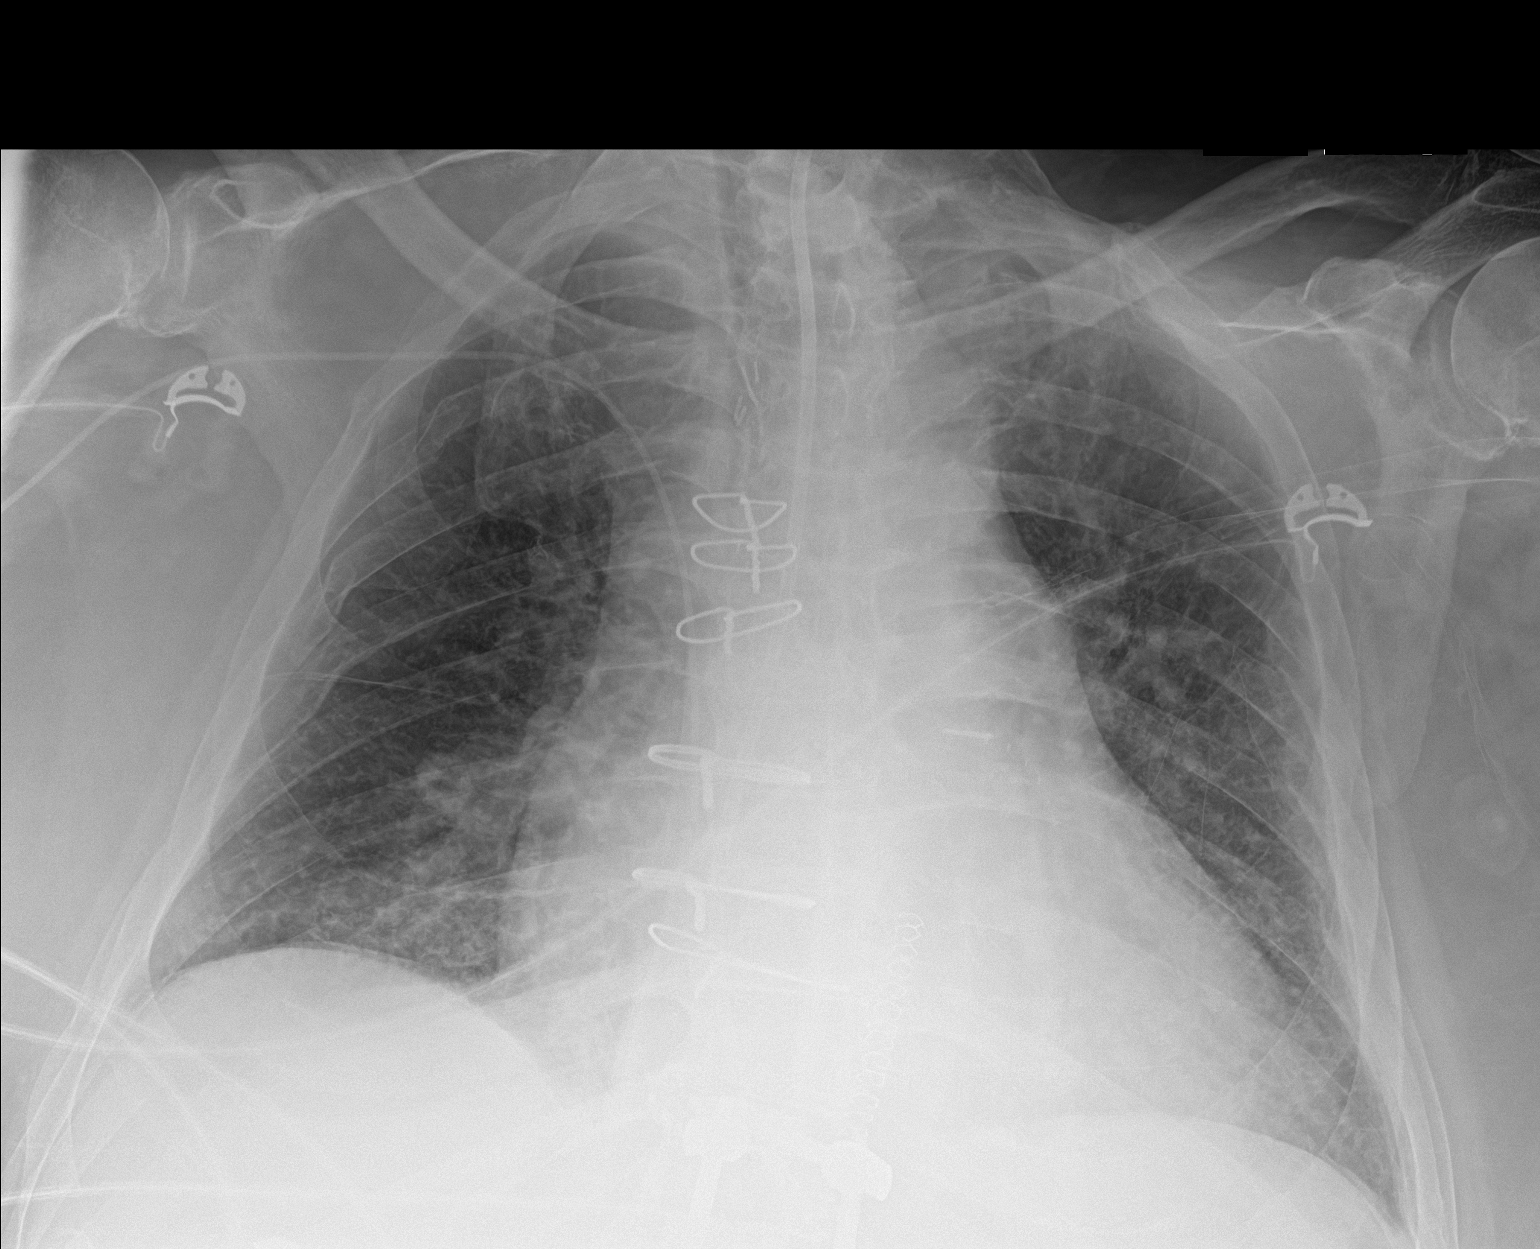

[1 of 1 positions shown; findings below may reference images not displayed]

FINDINGS: Mild bibasilar atelectasis. Pulmonary vascular congestion without
frank interstitial edema. No pleural effusion or pneumothorax.

Right arm PICC terminates the cavoatrial junction.

Enteric tube courses below the stomach.

Cardiomegaly.  Median sternotomy.

Skin staples overlying the midline lower chest.

Old right rib fracture deformities.

Lumbar spine fixation hardware, incompletely visualized.
IMPRESSION: Pulmonary vascular congestion with mild bibasilar atelectasis.

Support apparatus and postsurgical changes, as above.

## 2020-11-13 IMAGING — CT CT ABDOMEN W/O CM
2 of 4 series · 11 of 46 positions shown, 12 images · non-contrast
Comparison: Chest CT-05/20/2019; lumbar spine CT-05/22/2019;
05/11/2019
COMPARISON: Chest CT-05/20/2019; lumbar spine CT-05/22/2019;
05/11/2019

Addendum:
CLINICAL DATA: Evaluate anatomy prior to potential percutaneous
gastrostomy tube placement.

EXAM:
CT ABDOMEN WITHOUT CONTRAST
TECHNIQUE: Multidetector CT imaging of the abdomen was performed following the
standard protocol without IV contrast.

[Series 3: ap without · axial · non-contrast · 0.98mm/px · z∈[-5,+240]mm · 8 of 63 slices shown, 9 images]
[im 7/63  soft-tissue]
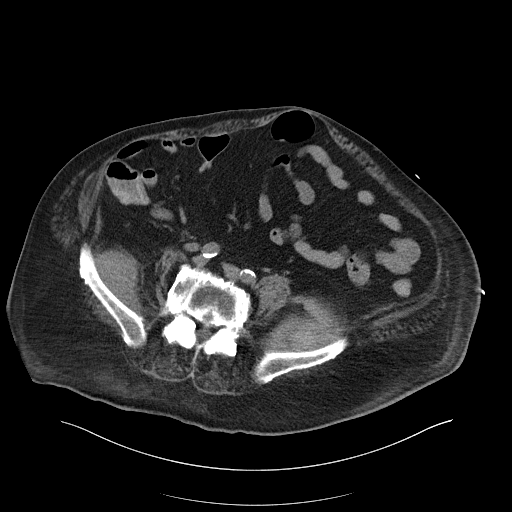
[im 7/63  bone]
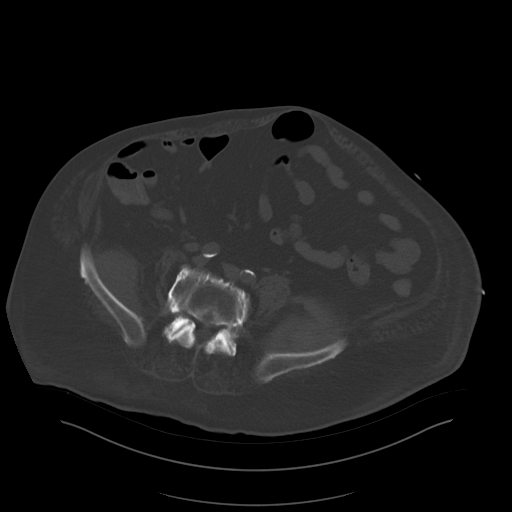
[im 14/63  soft-tissue]
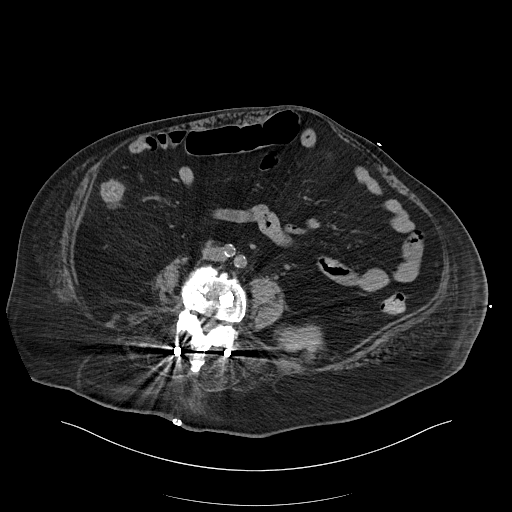
[im 21/63  soft-tissue]
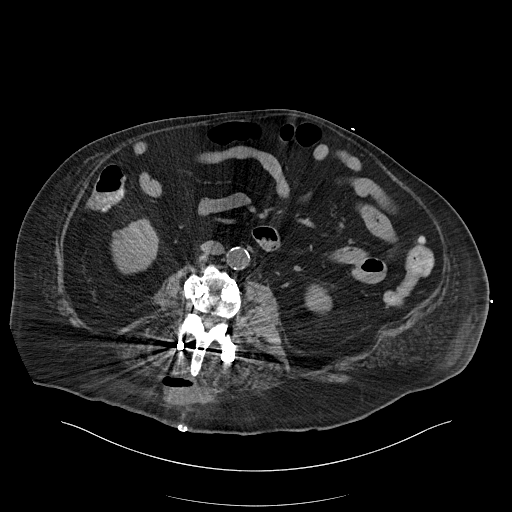
[im 28/63  soft-tissue]
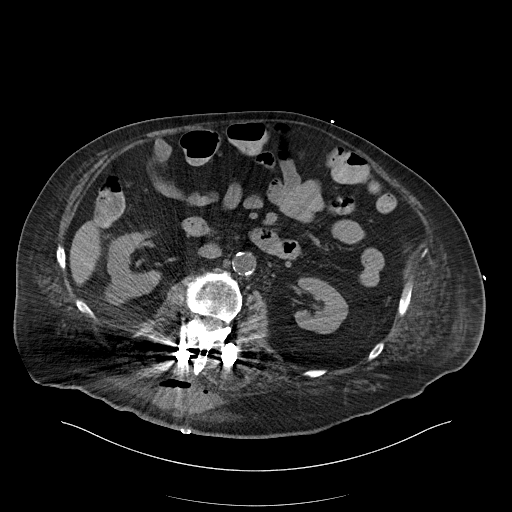
[im 35/63  soft-tissue]
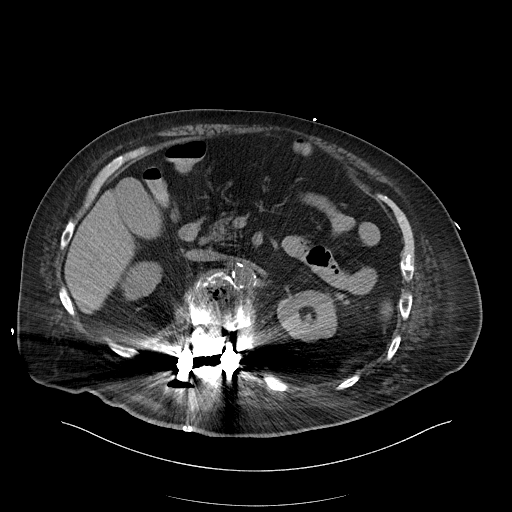
[im 42/63  soft-tissue]
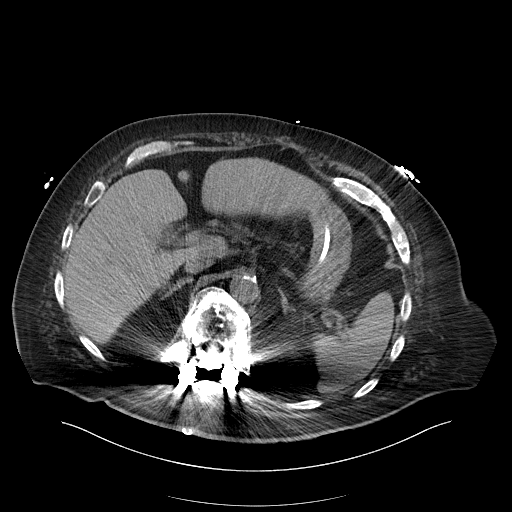
[im 49/63  soft-tissue]
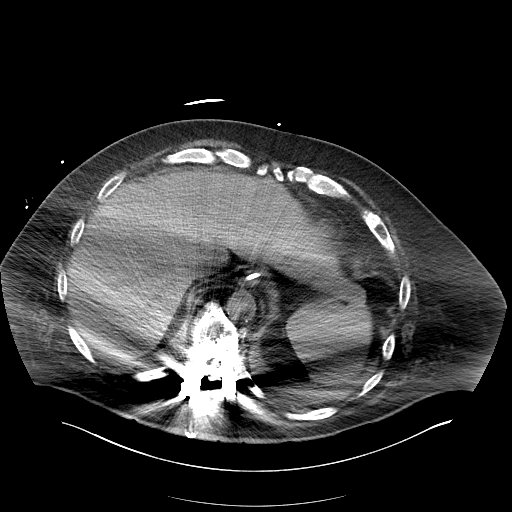
[im 56/63  soft-tissue]
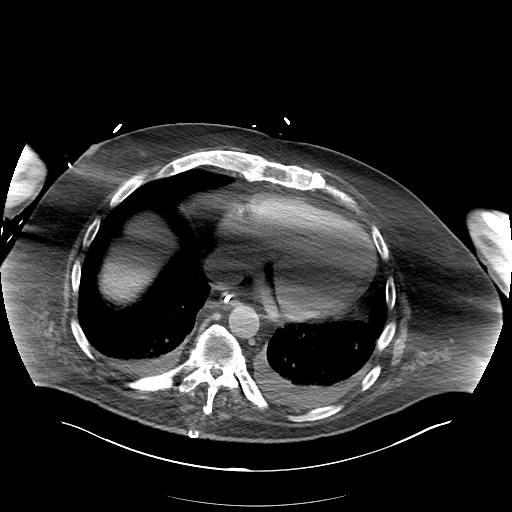

[Series 6: cor · coronal · 0.61mm/px · 3 of 114 slices shown]
[im 38/114  soft-tissue]
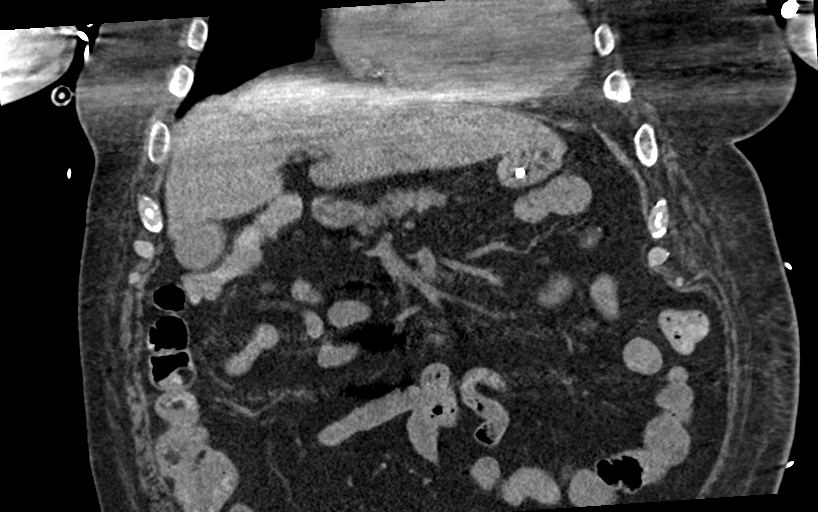
[im 51/114  soft-tissue]
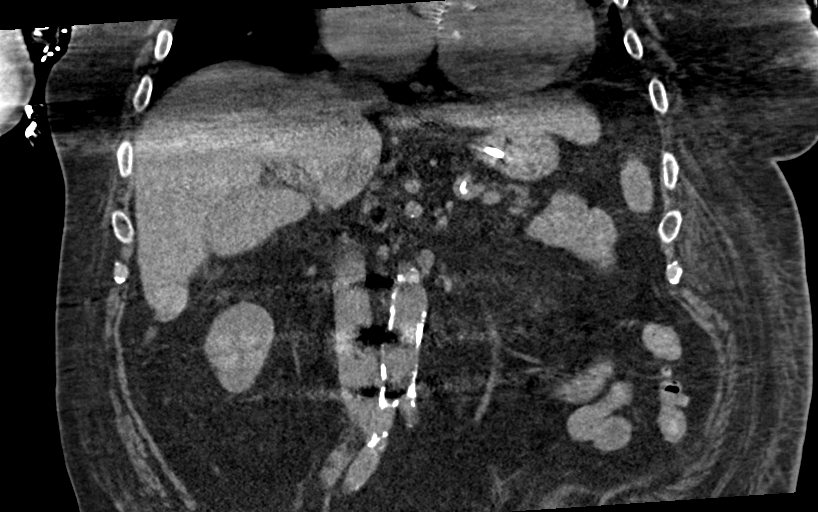
[im 63/114  soft-tissue]
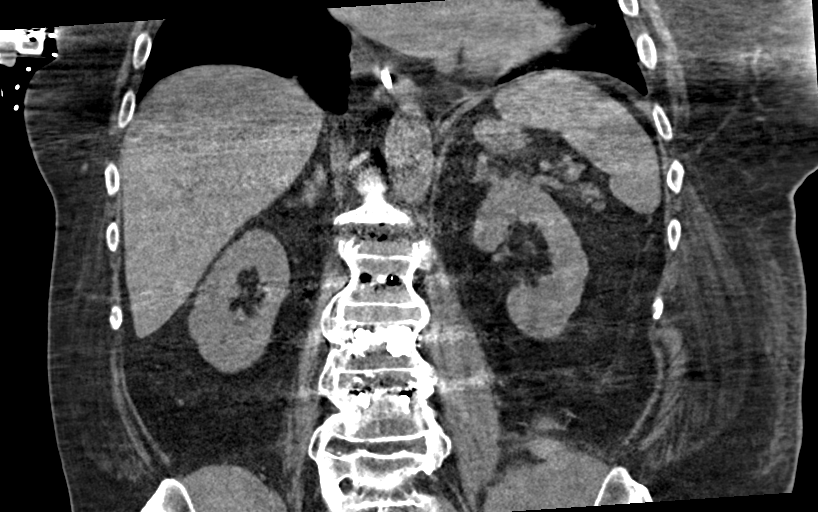

[11 of 46 positions shown; findings below may reference images not displayed]

FINDINGS: The lack of intravenous contrast limits the ability to evaluate
solid abdominal organs.

Lower chest: Limited visualization of the lower thorax demonstrates
trace bilateral effusions and associated bibasilar subsegmental
atelectasis, suboptimally evaluated due to patient respiratory
artifact.

Cardiomegaly. Post mitral valve repair. Coronary artery
calcifications. No pericardial effusion.

Hepatobiliary: Nodularity hepatic contour as could be seen in the
setting cirrhotic change. Apparent high density material within the
gallbladder could represent cholelithiasis though could be
artifactual due to streak artifact from the patient's paraspinal
fusion hardware. No definitive gallbladder wall thickening on this
noncontrast examination. No ascites.

Pancreas: The pancreas appears atrophic but otherwise normal.

Spleen: Normal noncontrast appearance of the spleen.

Adrenals/Urinary Tract: No evidence of nephrolithiasis. Note is made
of a hypoattenuating (12 Hounsfield unit) approximately 2.7 cm
partially exophytic lesion arising from the posterosuperior aspect
the right kidney (image 34, series 3), incompletely characterized
though favored to represent a renal cyst. No urinary obstruction or
perinephric stranding. Normal noncontrast appearance of the
bilateral adrenal glands. The urinary bladder was not imaged.

Stomach/Bowel: The anterior wall the stomach is well apposed against
the ventral wall of the upper abdomen without interposed hepatic
parenchyma or transverse colon.

Enteric tube terminates within the mid body of the stomach.
Scattered minimal colonic diverticulosis without evidence of
superimposed acute diverticulitis. No pneumoperitoneum, pneumatosis
or portal venous gas.

Vascular/Lymphatic: Atherosclerotic plaque within a normal caliber
abdominal aorta.

Other: Note is made of retroperitoneal hematomas affecting the
bilateral iliacus musculature (image 63, series 3), similar in hind
site compared to lumbar spine CT 05/25/2019.

Scattered subcutaneous emphysema and stranding about the lower
lumbar spine operative site including an approximately 12.6 x 6.3 x
2.9 cm mixed air and fluid collection about the midline of the low
back again (axial image 38, series 3; sagittal image 77, series 7).

Musculoskeletal: The patient has undergone T11-L4 paraspinal fusion
without evidence of hardware failure or loosening though there is
persistent widening involving the anterior aspect of the L1-L2
intervertebral disc space. Stable sequela of ankylosing spondylitis.
Old left 7th posterolateral rib fracture.
IMPRESSION: 1. Gastric anatomy amenable to potential percutaneous gastrostomy
tube placement as indicated.
2. Retroperitoneal hematomas affecting the bilateral iliacus
musculature, similar in hind site to lumbar spine CT performed
05/25/2019. Correlation with hematocrit levels is advised.
3. Interval T11-L4 paraspinal fusion without evidence of hardware
failure or loosening, though note is made of an approximately
cm mixed air and fluid collection about the midline of the low back,
presumably postoperative in etiology though underlying infection is
not excluded. Clinical correlation is advised.
4.  Aortic Atherosclerosis (V4PE9-C2P.P).

ADDENDUM:
Critical Value/emergent results were called by telephone at the time
of interpretation on 06/02/2019 at [DATE] to provider Rashard
Doria, IR PA, who verbally acknowledged these results and will
follow-up with the providing clinical team.

*** End of Addendum ***
FINDINGS: The lack of intravenous contrast limits the ability to evaluate
solid abdominal organs.

Lower chest: Limited visualization of the lower thorax demonstrates
trace bilateral effusions and associated bibasilar subsegmental
atelectasis, suboptimally evaluated due to patient respiratory
artifact.

Cardiomegaly. Post mitral valve repair. Coronary artery
calcifications. No pericardial effusion.

Hepatobiliary: Nodularity hepatic contour as could be seen in the
setting cirrhotic change. Apparent high density material within the
gallbladder could represent cholelithiasis though could be
artifactual due to streak artifact from the patient's paraspinal
fusion hardware. No definitive gallbladder wall thickening on this
noncontrast examination. No ascites.

Pancreas: The pancreas appears atrophic but otherwise normal.

Spleen: Normal noncontrast appearance of the spleen.

Adrenals/Urinary Tract: No evidence of nephrolithiasis. Note is made
of a hypoattenuating (12 Hounsfield unit) approximately 2.7 cm
partially exophytic lesion arising from the posterosuperior aspect
the right kidney (image 34, series 3), incompletely characterized
though favored to represent a renal cyst. No urinary obstruction or
perinephric stranding. Normal noncontrast appearance of the
bilateral adrenal glands. The urinary bladder was not imaged.

Stomach/Bowel: The anterior wall the stomach is well apposed against
the ventral wall of the upper abdomen without interposed hepatic
parenchyma or transverse colon.

Enteric tube terminates within the mid body of the stomach.
Scattered minimal colonic diverticulosis without evidence of
superimposed acute diverticulitis. No pneumoperitoneum, pneumatosis
or portal venous gas.

Vascular/Lymphatic: Atherosclerotic plaque within a normal caliber
abdominal aorta.

Other: Note is made of retroperitoneal hematomas affecting the
bilateral iliacus musculature (image 63, series 3), similar in hind
site compared to lumbar spine CT 05/25/2019.

Scattered subcutaneous emphysema and stranding about the lower
lumbar spine operative site including an approximately 12.6 x 6.3 x
2.9 cm mixed air and fluid collection about the midline of the low
back again (axial image 38, series 3; sagittal image 77, series 7).

Musculoskeletal: The patient has undergone T11-L4 paraspinal fusion
without evidence of hardware failure or loosening though there is
persistent widening involving the anterior aspect of the L1-L2
intervertebral disc space. Stable sequela of ankylosing spondylitis.
Old left 7th posterolateral rib fracture.
IMPRESSION: 1. Gastric anatomy amenable to potential percutaneous gastrostomy
tube placement as indicated.
2. Retroperitoneal hematomas affecting the bilateral iliacus
musculature, similar in hind site to lumbar spine CT performed
05/25/2019. Correlation with hematocrit levels is advised.
3. Interval T11-L4 paraspinal fusion without evidence of hardware
failure or loosening, though note is made of an approximately
cm mixed air and fluid collection about the midline of the low back,
presumably postoperative in etiology though underlying infection is
not excluded. Clinical correlation is advised.
4.  Aortic Atherosclerosis (V4PE9-C2P.P).

## 2020-11-14 IMAGING — DX DG CHEST 1V PORT
1 series · 1 of 1 positions shown · non-contrast
Comparison: Eleven 20 undo that 05/28/2019

CLINICAL DATA: Hypoxia.

EXAM:
PORTABLE CHEST 1 VIEW

[chest]
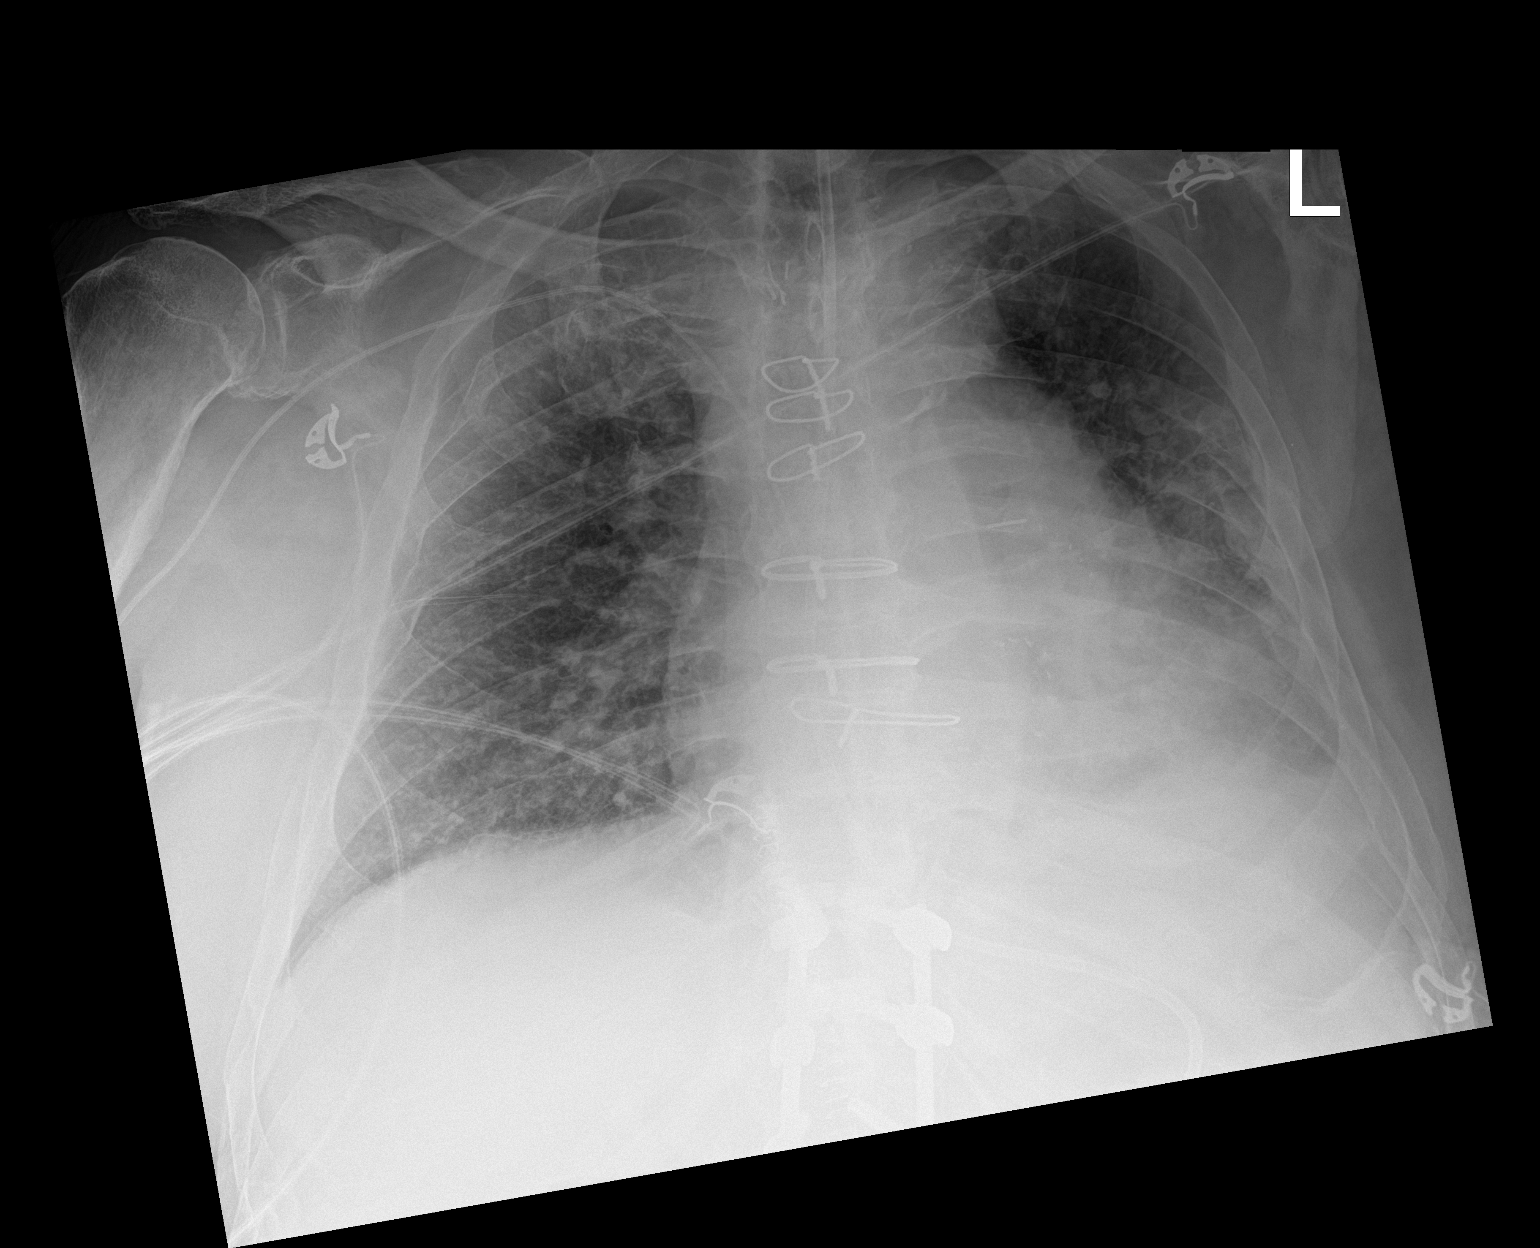

[1 of 1 positions shown; findings below may reference images not displayed]

FINDINGS: Right-sided PICC line terminates in the area of the caval to atrial
junction. Enteric tube courses through in off the field of the
radiograph.

Signs of median sternotomy and partial visualization of lumbar
spinal fusion as before.

Cardiomediastinal contours are enlarged with signs of in-situ
creased interstitial prominence bilaterally. No dense consolidation
but with worsening opacification in the retrocardiac region compared
to prior study. Blunting of left costophrenic angle.

No acute bone process. Deformity of bilateral ribs is similar to
prior study and also noted on 06/02/2019 CT of the abdomen pelvis.
IMPRESSION: Findings suggestive of volume overload or heart failure. Can not
exclude developing infection in the left lung base.

Cardiomegaly as before.

## 2020-11-15 IMAGING — XA IR PERC PLACEMENT GASTROSTOMY
2 series · 5 of 5 positions shown · non-contrast
Comparison: none

CLINICAL DATA: Dysphagia and need for percutaneous gastrostomy tube
for long-term nutrition.

[Series 1: fl angio · 4 of 51 frames shown]
[frame 8/51]
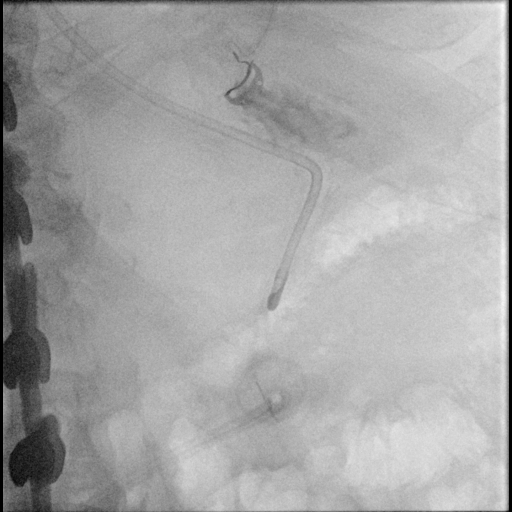
[frame 15/51]
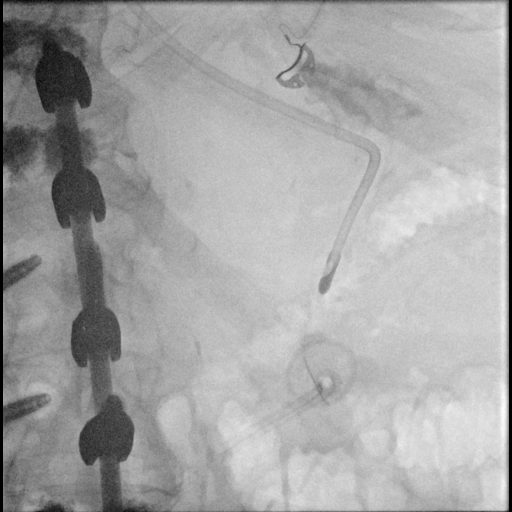
[frame 26/51]
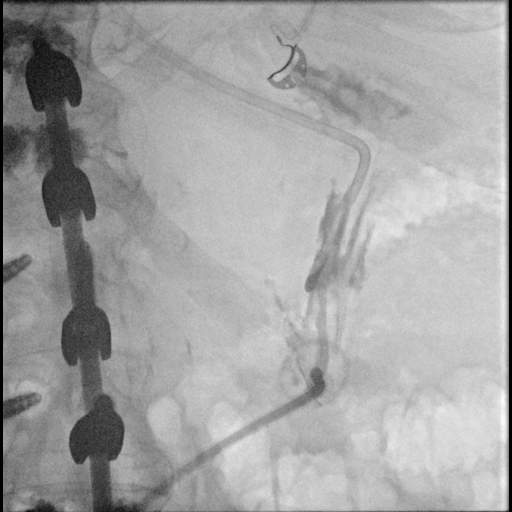
[frame 44/51]
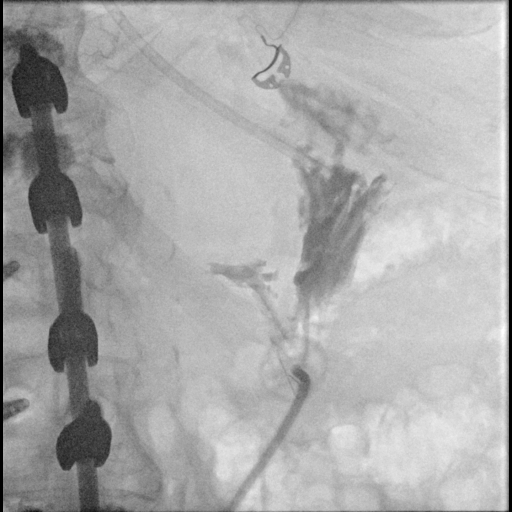

[Series 300: dsa body · 1 of 1 slices shown]
[im 1/1]
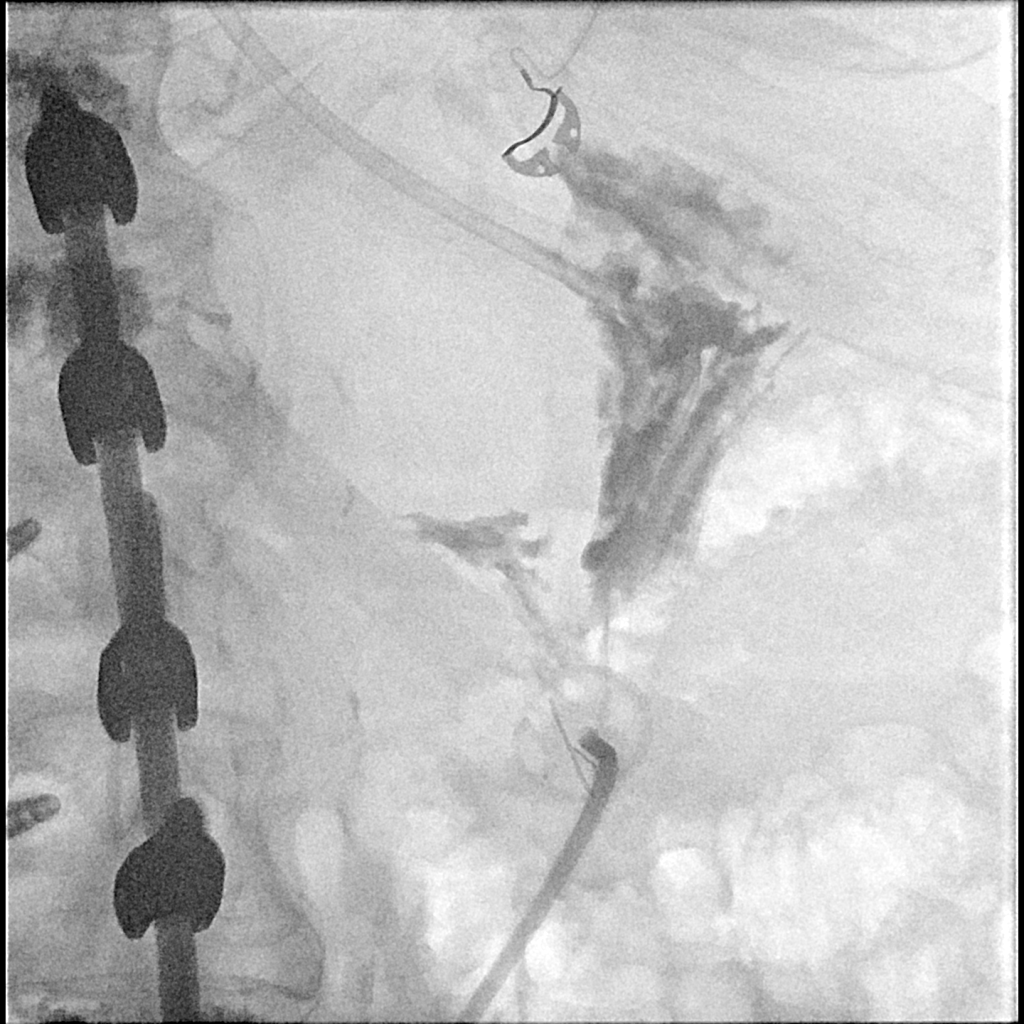

[5 of 5 positions shown; findings below may reference images not displayed]

EXAM:
PERCUTANEOUS GASTROSTOMY TUBE PLACEMENT

ANESTHESIA/SEDATION:
1.0 mg IV Versed; 50 mcg IV Fentanyl.

Total Moderate Sedation Time

10 minutes.

The patient's level of consciousness and physiologic status were
continuously monitored during the procedure by Radiology nursing.

CONTRAST:  15mL OMNIPAQUE IOHEXOL 300 MG/ML  SOLN

MEDICATIONS:
The patient is receiving multiple IV antibiotics and additional
antibiotics were not administered for the procedure.

FLUOROSCOPY TIME:  1 minutes and 30 seconds.  111 mGy.

PROCEDURE:
The procedure, risks, benefits, and alternatives were explained to
the patient's daughter. Questions regarding the procedure were
encouraged and answered. The patient's daughter understands and
consents to the procedure. A time-out was performed prior to
initiating the procedure.

A 5-French catheter was then advanced through the patient's mouth
under fluoroscopy into the esophagus and to the level of the
stomach. This catheter was used to insufflate the stomach with air
under fluoroscopy.

The abdominal wall was prepped with chlorhexidine in a sterile
fashion, and a sterile drape was applied covering the operative
field. A sterile gown and sterile gloves were used for the
procedure. Local anesthesia was provided with 1% Lidocaine.

A skin incision was made in the upper abdominal wall. Under
fluoroscopy, an 18 gauge trocar needle was advanced into the
stomach. Contrast injection was performed to confirm intraluminal
position of the needle tip. A single T tack was then deployed in the
lumen of the stomach. This was brought up to tension at the skin
surface.

Over a guidewire, a 9-French sheath was advanced into the lumen of
the stomach. The wire was left in place as a safety wire. A loop
snare device from a percutaneous gastrostomy kit was then advanced
into the stomach.

A floppy guide wire was advanced through the orogastric catheter
under fluoroscopy in the stomach. The loop snare advanced through
the percutaneous gastric access was used to snare the guide wire.
This allowed withdrawal of the loop snare out of the patient's mouth
by retraction of the orogastric catheter and wire.

A 20-French bumper retention gastrostomy tube was looped around the
snare device. It was then pulled back through the patient's mouth.
The retention bumper was brought up to the anterior gastric wall.
The T tack suture was cut at the skin. The exiting gastrostomy tube
was cut to appropriate length and a feeding adapter applied. The
catheter was injected with contrast material to confirm position and
a fluoroscopic spot image saved. The tube was then flushed with
saline. A dressing was applied over the gastrostomy exit site.

COMPLICATIONS:
None.
FINDINGS: The stomach distended well with air allowing safe placement of the
gastrostomy tube. After placement, the tip of the gastrostomy tube
lies in the body of the stomach.
IMPRESSION: Percutaneous gastrostomy with placement of a 20-French bumper
retention tube in the body of the stomach. This tube can be used for
percutaneous feeds beginning in 24 hours after placement.

## 2020-11-16 IMAGING — CT CT ABD-PELV W/O CM
2 of 4 series · 15 of 46 positions shown, 17 images · non-contrast
Comparison: CT abdomen dated 06/02/2019.

CLINICAL DATA: Unexplained anemia, recent lumbar surgery.

EXAM:
CT ABDOMEN AND PELVIS WITHOUT CONTRAST
TECHNIQUE: Multidetector CT imaging of the abdomen and pelvis was performed
following the standard protocol without IV contrast.

[Series 3: a/p w/o 5mm · axial · non-contrast · 0.96mm/px · z∈[+1036,+1446]mm · 12 of 98 slices shown, 14 images]
[im 8/98  soft-tissue]
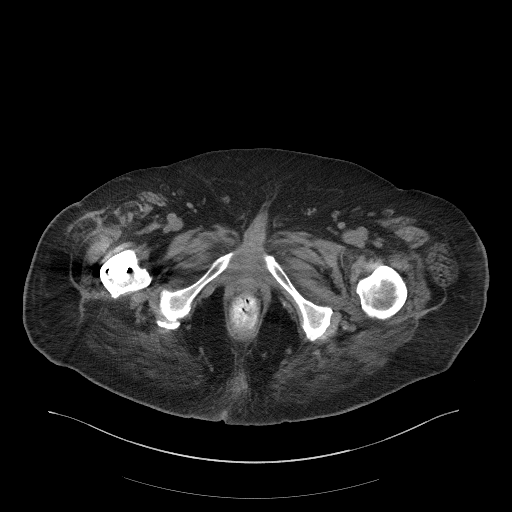
[im 8/98  bone]
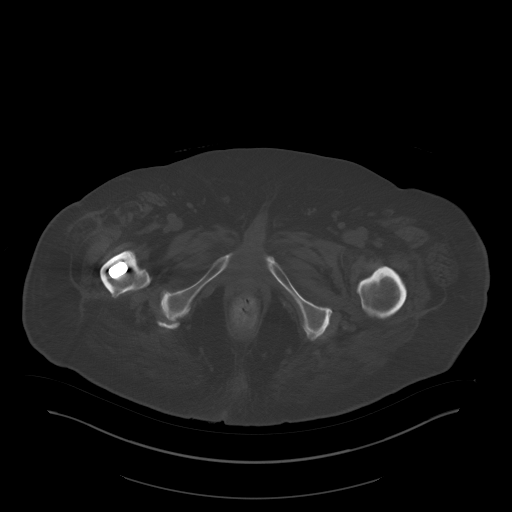
[im 15/98  soft-tissue]
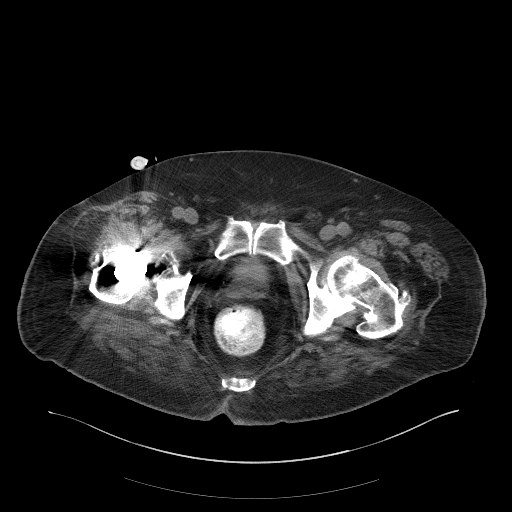
[im 23/98  soft-tissue]
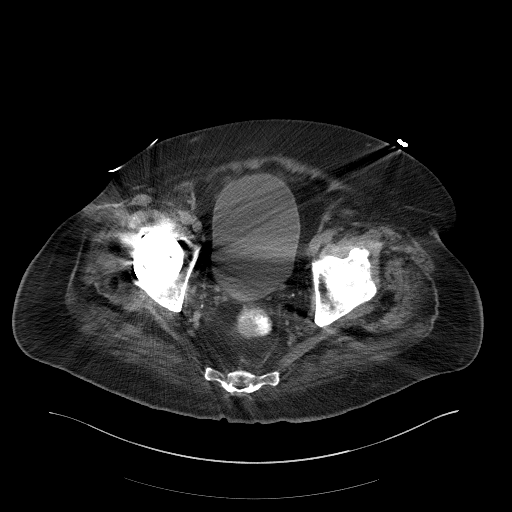
[im 30/98  soft-tissue]
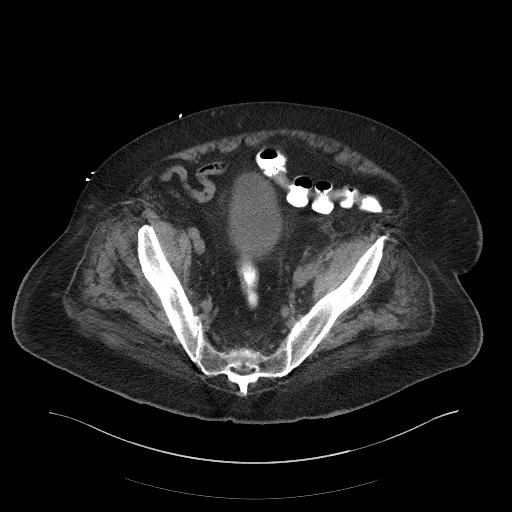
[im 38/98  soft-tissue]
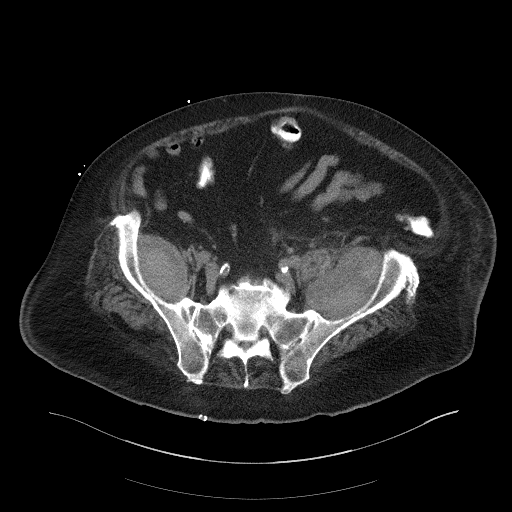
[im 45/98  soft-tissue]
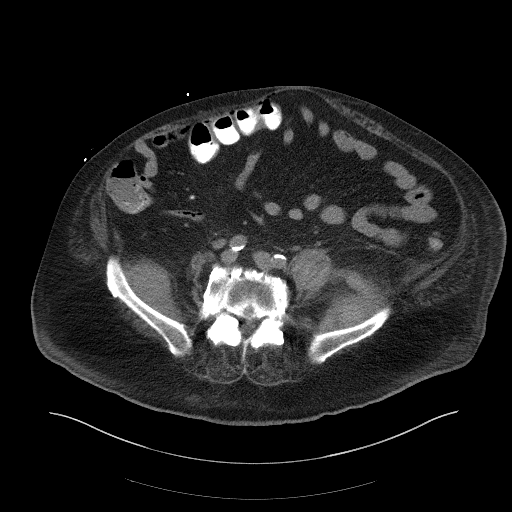
[im 53/98  soft-tissue]
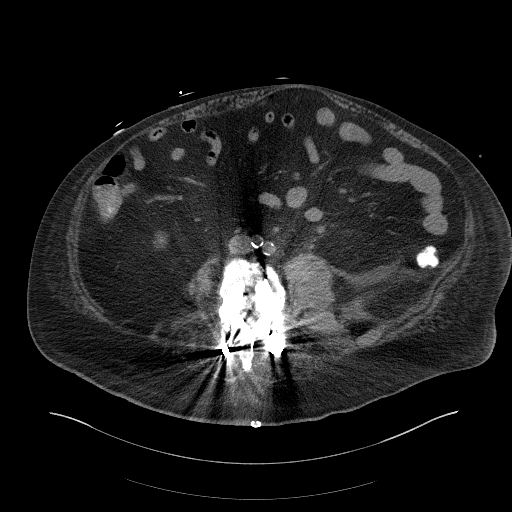
[im 60/98  soft-tissue]
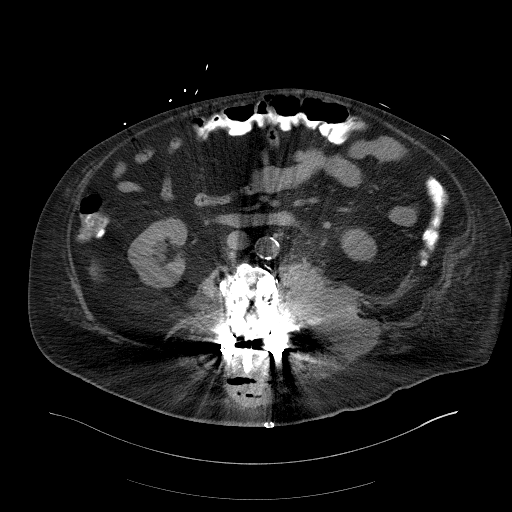
[im 68/98  soft-tissue]
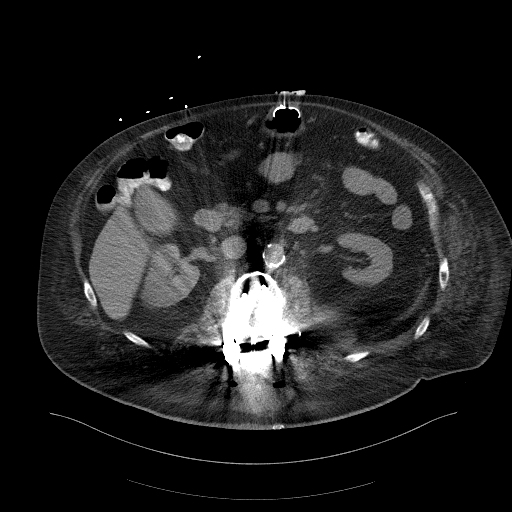
[im 68/98  bone]
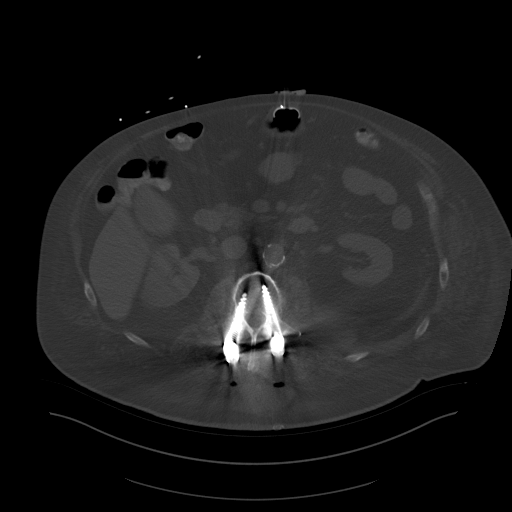
[im 75/98  soft-tissue]
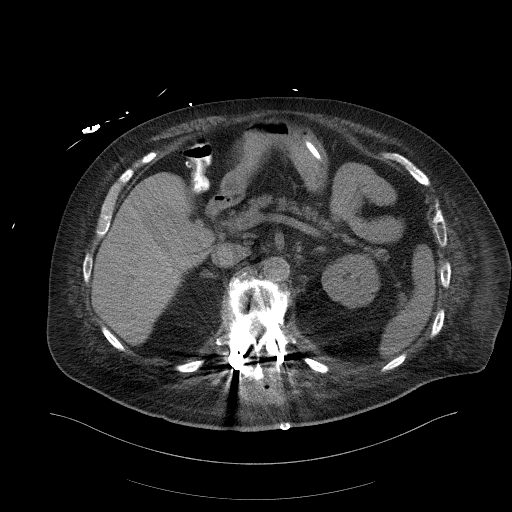
[im 83/98  soft-tissue]
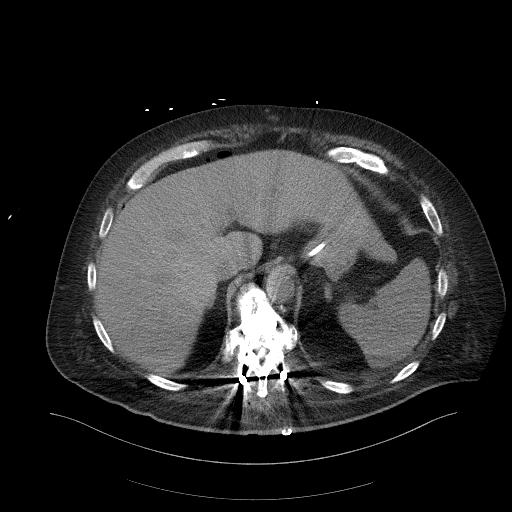
[im 90/98  soft-tissue]
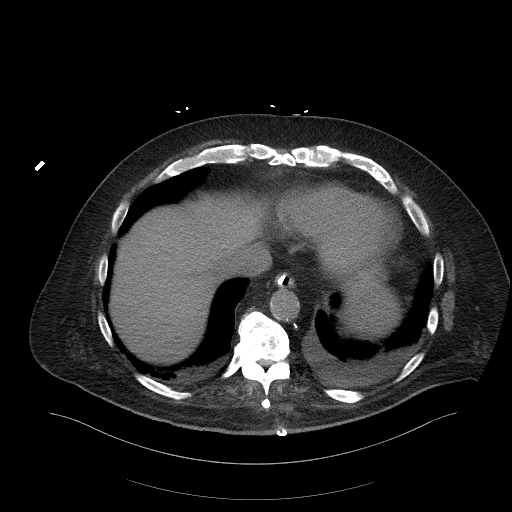

[Series 6: a/p w/o cor · coronal · non-contrast · 0.97mm/px · 3 of 167 slices shown]
[im 56/167  soft-tissue]
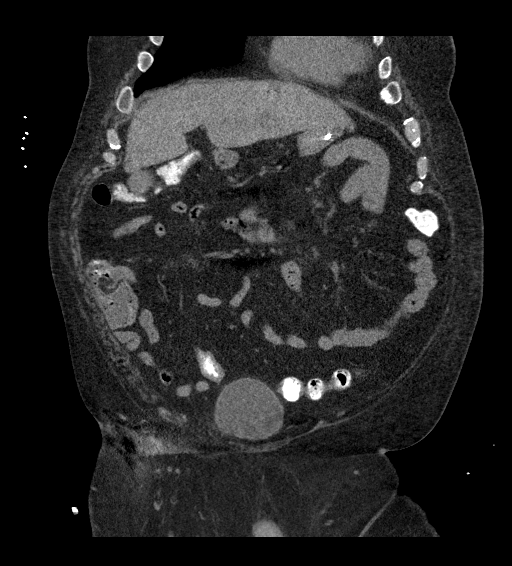
[im 74/167  soft-tissue]
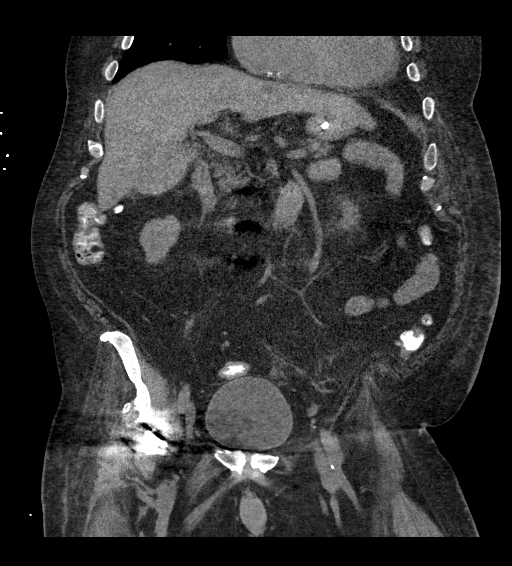
[im 93/167  soft-tissue]
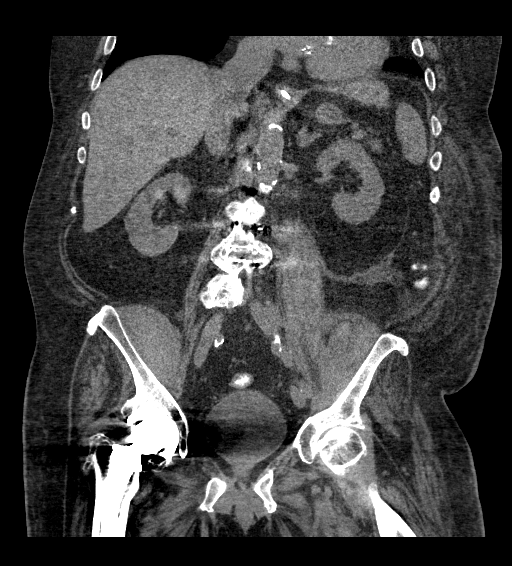

[15 of 46 positions shown; findings below may reference images not displayed]

FINDINGS: Lower chest: LEFT basilar pleural effusion with associated
atelectasis, incompletely imaged. Mild atelectasis and/or effusion
at the RIGHT lung base.

Hepatobiliary: No focal liver abnormality is seen. No gallstones,
gallbladder wall thickening, or biliary dilatation.

Pancreas: Unremarkable. No pancreatic ductal dilatation or
surrounding inflammatory changes.

Spleen: Normal in size without focal abnormality.

Adrenals/Urinary Tract: Adrenal glands appear normal. Probable RIGHT
renal cysts, as previously described. Kidneys otherwise unremarkable
without suspicious mass, stone or hydronephrosis. Bladder appears
normal.

Stomach/Bowel: No dilated large or small bowel loops. No evidence of
bowel wall inflammation. Scattered diverticulosis but no focal
inflammatory change to suggest acute diverticulitis. New gastrostomy
tube in place.

Vascular/Lymphatic: Aortic atherosclerosis. No enlarged lymph nodes
seen in the abdomen or pelvis.

Reproductive: Prostate is unremarkable.

Other: Again noted are bilateral retroperitoneal hematomas at the
levels of the bilateral iliacus musculature. In the interval, this
has increased in extent superiorly to the LEFT psoas musculature,
measuring approximately 11 cm extent. Low-density components of the
collection posterior and anterior to the LEFT psoas musculature
could represent concomitant infection/abscess.

Small amount of free intraperitoneal air, presumably related to the
interval gastrostomy placement.

Musculoskeletal: T11-L4 paraspinal fusion. Hardware appears intact
and stable in position. Again noted is the presumably postsurgical
collection of fluid and air within the midline of the lower back
soft tissues, stable in extent.
IMPRESSION: 1. Bilateral retroperitoneal hematomas again noted at the levels of
the bilateral iliacus musculature. In the interval, this has
increased in extent superiorly to the LEFT psoas musculature,
measuring approximately 11 cm extent at the level of the LEFT psoas.
Low-density components of the collection both posterior and anterior
to the LEFT psoas musculature could represent extension of hematoma
or concomitant infection/abscess (febrile?).
2. Small amount of free intraperitoneal air, presumably related to
the interval gastrostomy tube placement.
3. LEFT basilar pleural effusion with associated atelectasis,
incompletely imaged. Mild atelectasis and/or effusion at the RIGHT
lung base.
4. Stable appearance of the presumably postsurgical collection of
fluid and air within the soft tissues of the midline lower back. As
indicated on CT report of 06/02/2019, underlying infection cannot be
excluded based on appearance alone.
5. Colonic diverticulosis without evidence of acute diverticulitis.

Aortic Atherosclerosis ([id number].T).

## 2020-11-18 IMAGING — DX DG CHEST 1V PORT
1 series · 1 of 1 positions shown · non-contrast
Comparison: 06/03/2019 chest radiograph.

CLINICAL DATA: Hypoxia, dyspnea, with smoking history

EXAM:
PORTABLE CHEST 1 VIEW

[chest]
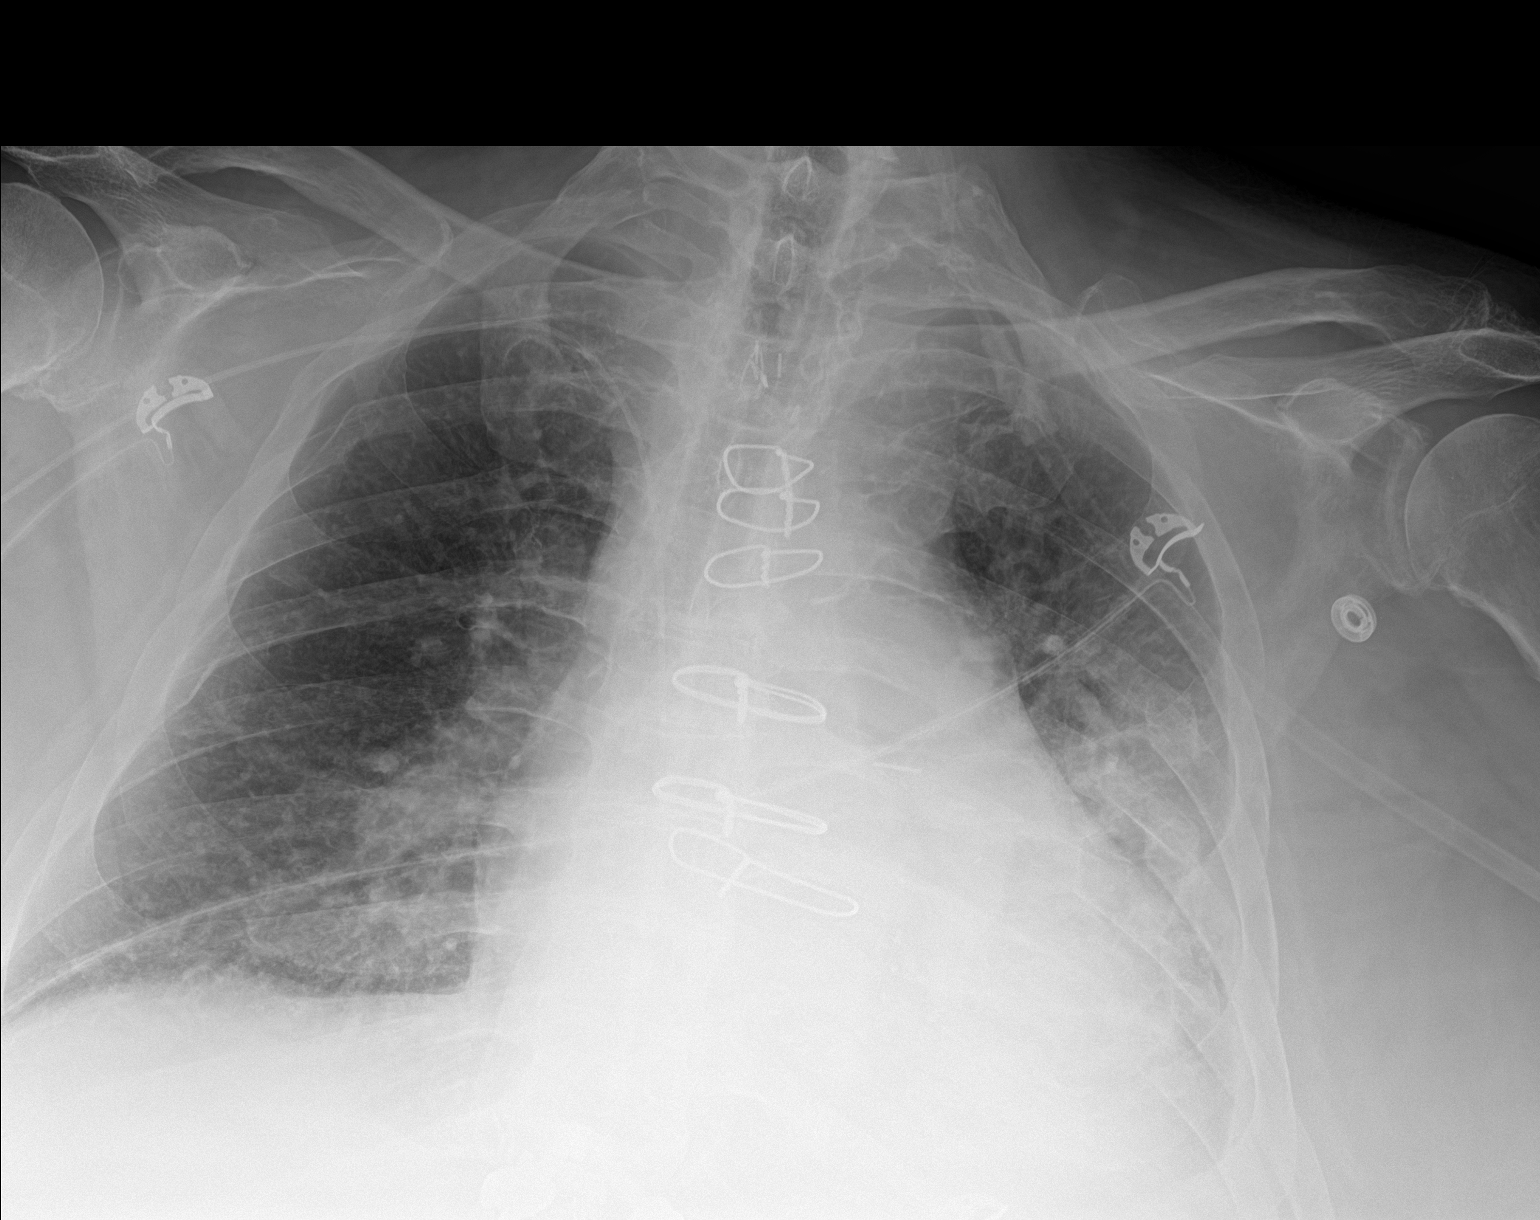

[1 of 1 positions shown; findings below may reference images not displayed]

FINDINGS: Right PICC terminates at the cavoatrial junction. Intact sternotomy
wires. Stable cardiomediastinal silhouette with mild to moderate
cardiomegaly. No pneumothorax. Stable small left pleural effusion.
No right pleural effusion. Hazy bilateral parahilar lung opacities,
asymmetric to the left, mildly worsened.
IMPRESSION: 1. Mild-to-moderate cardiomegaly. Mildly worsened bilateral hazy
parahilar lung opacities, asymmetric to the left, favor asymmetric
pulmonary edema.
2. Stable small left pleural effusion.

## 2020-11-24 IMAGING — DX DG CHEST 1V PORT
1 series · 1 of 1 positions shown · non-contrast
Comparison: Chest radiograph dated 06/07/2019

CLINICAL DATA: Chest pain

EXAM:
PORTABLE CHEST 1 VIEW

[chest]
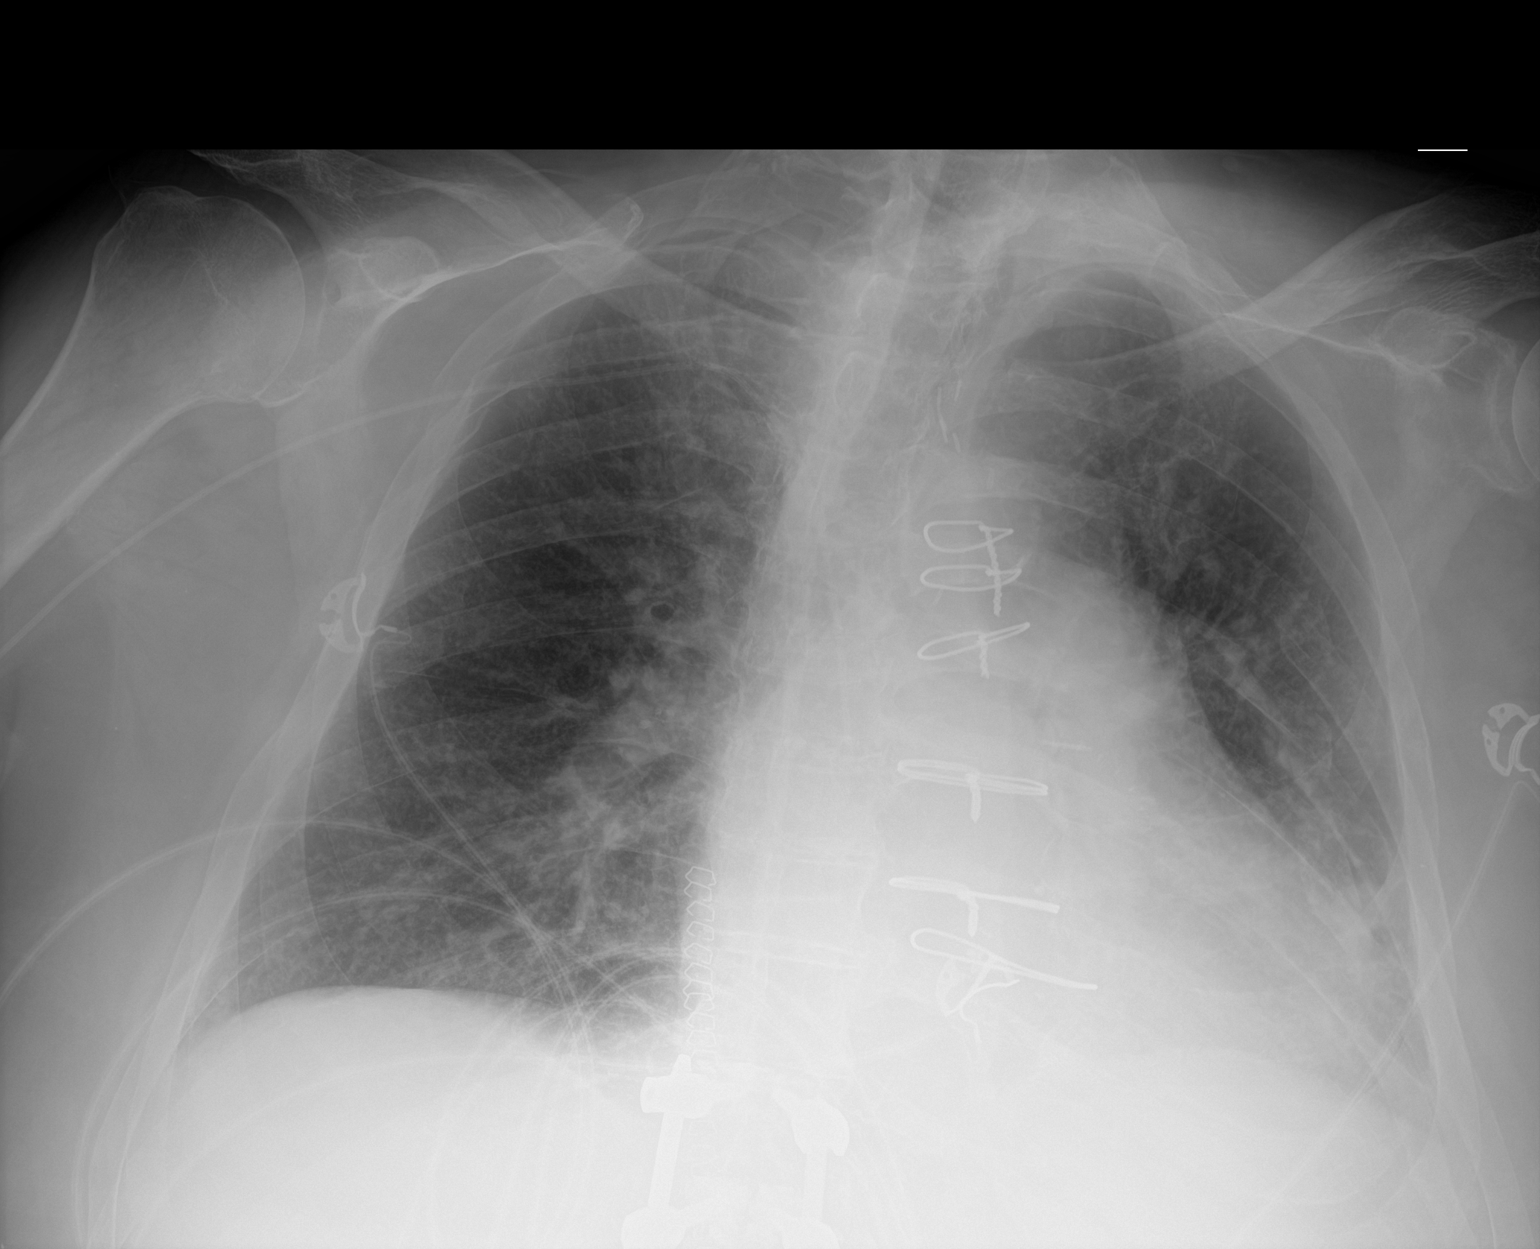

[1 of 1 positions shown; findings below may reference images not displayed]

FINDINGS: The heart is enlarged. Median sternotomy wires and lumbar fixation
hardware are seen. A right upper extremity peripherally inserted
central venous catheter tip overlies the superior vena cava.
Bilateral lower lung predominant airspace opacities have decreased
and are now mild. There is no pleural effusion or pneumothorax.
IMPRESSION: 1. Mild bilateral lower lung predominant airspace opacities are
decreased since 06/07/2019.
[DATE]. Cardiomegaly.

## 2020-11-24 IMAGING — DX DG ABDOMEN 1V
1 series · 2 of 2 positions shown · non-contrast
Comparison: CT abdomen pelvis dated 06/05/2019

CLINICAL DATA: Abdominal pain

EXAM:
ABDOMEN - 1 VIEW

[Series 1: abdomen · 0.14mm/px · 2 of 2 slices shown]
[im 1/2]
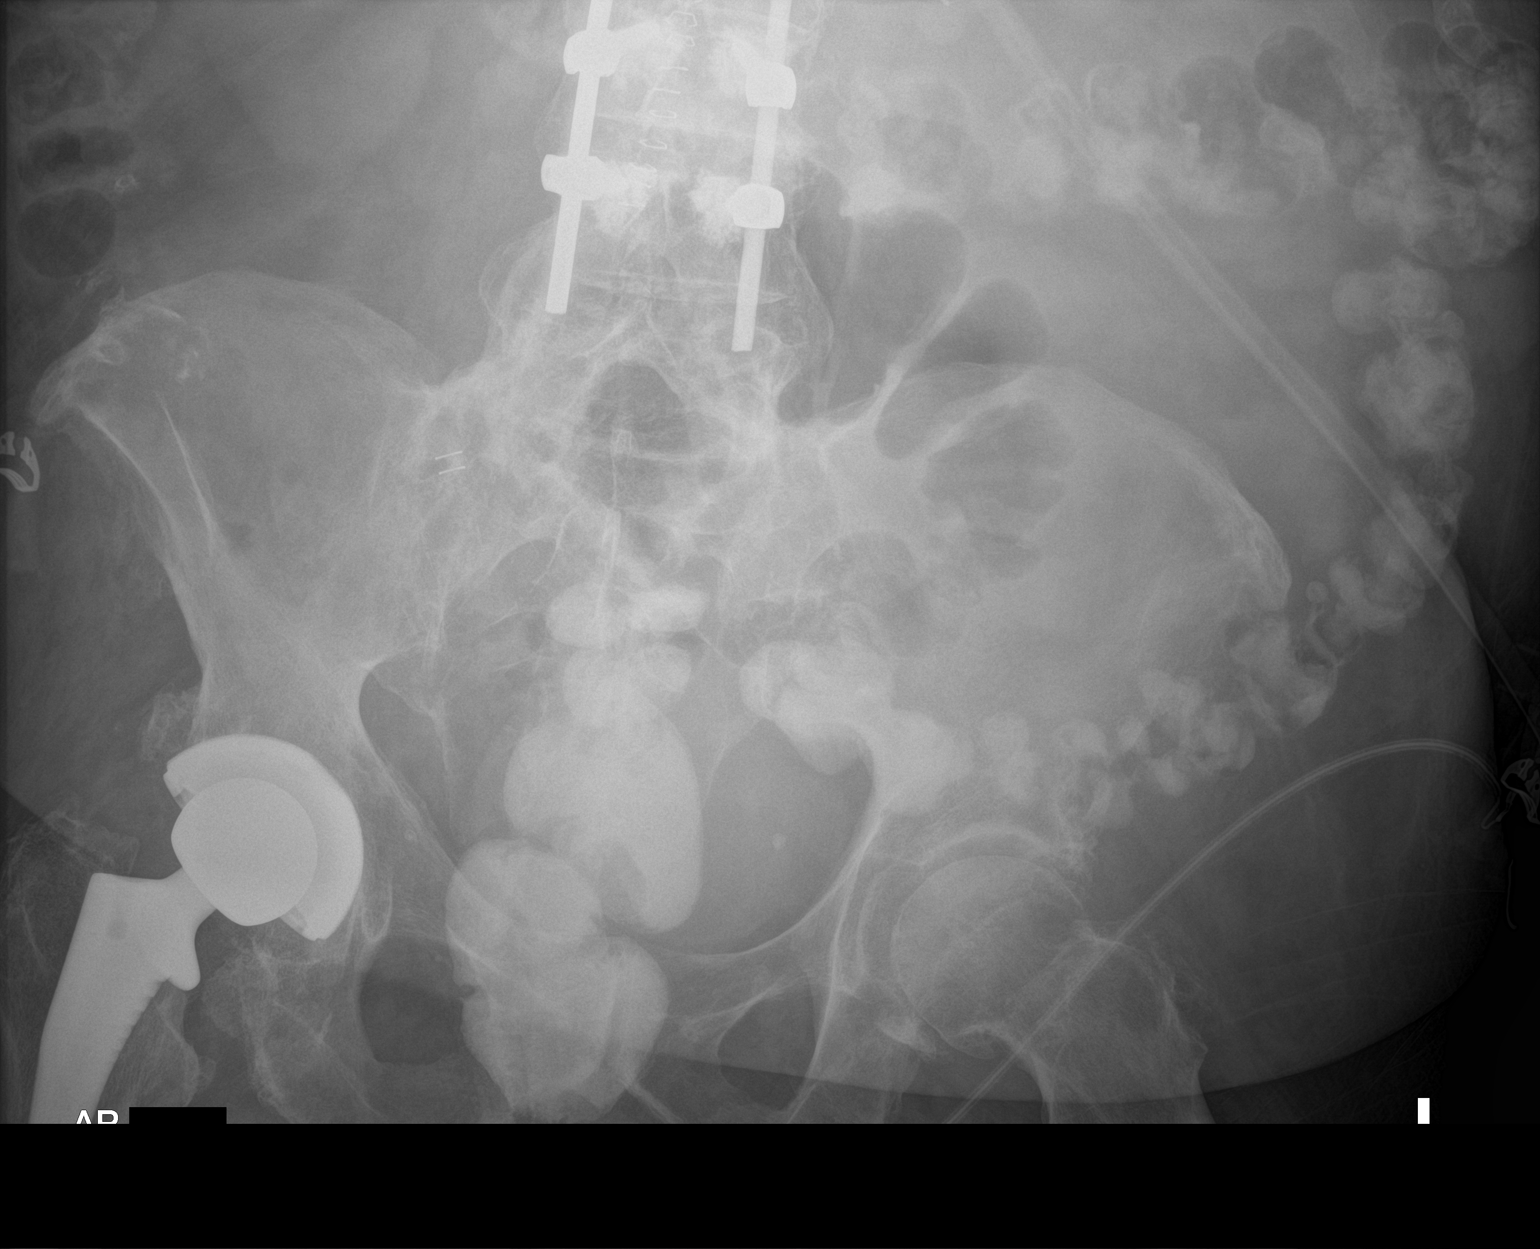
[im 2/2]
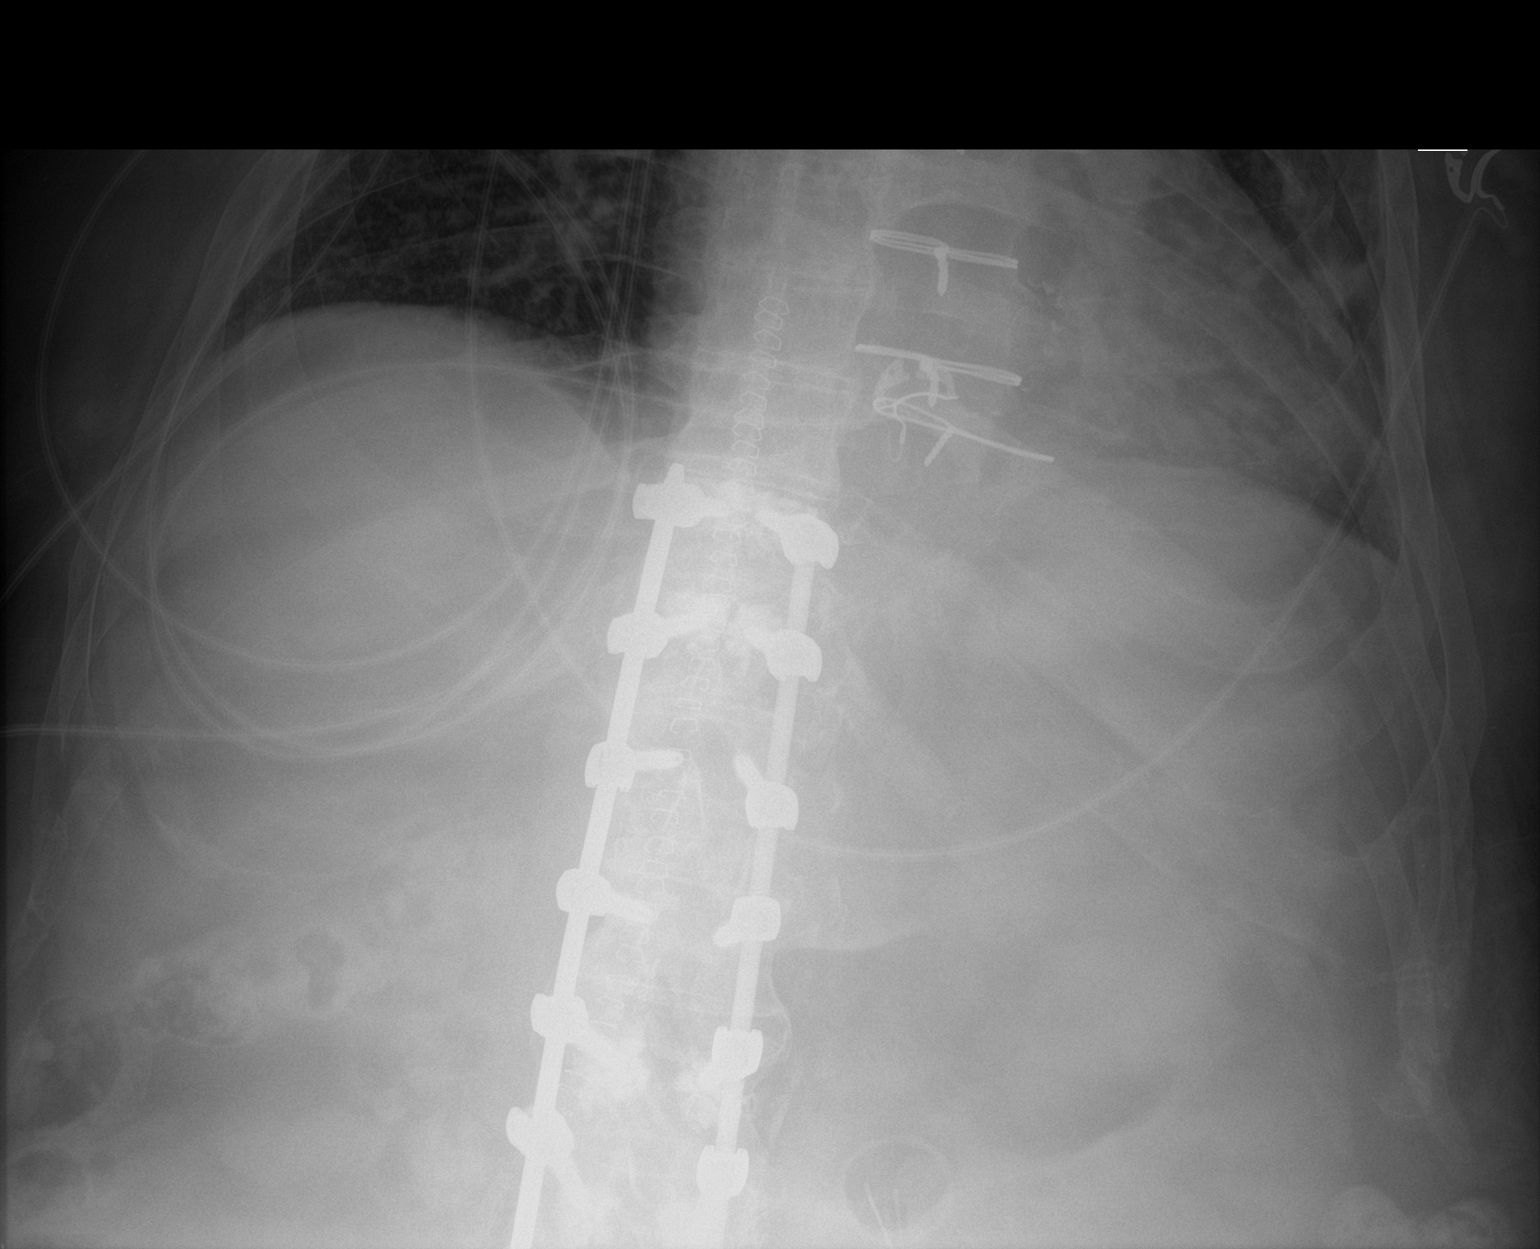

[2 of 2 positions shown; findings below may reference images not displayed]

FINDINGS: The bowel gas pattern is nonobstructive. Air-fluid levels and free
intraperitoneal air cannot be excluded on this supine exam. Lumbar
spine fixation hardware is noted. Median sternotomy wires and a
right hip arthroplasty are seen. Degenerative changes are seen in
the spine and left hip.
IMPRESSION: Nonobstructive bowel gas pattern.

## 2020-11-25 IMAGING — DX DG ABDOMEN 1V
1 series · 1 of 1 positions shown · non-contrast
Comparison: None.

CLINICAL DATA: PEG tube malfunction

EXAM:
ABDOMEN - 1 VIEW

[abdomen kub]
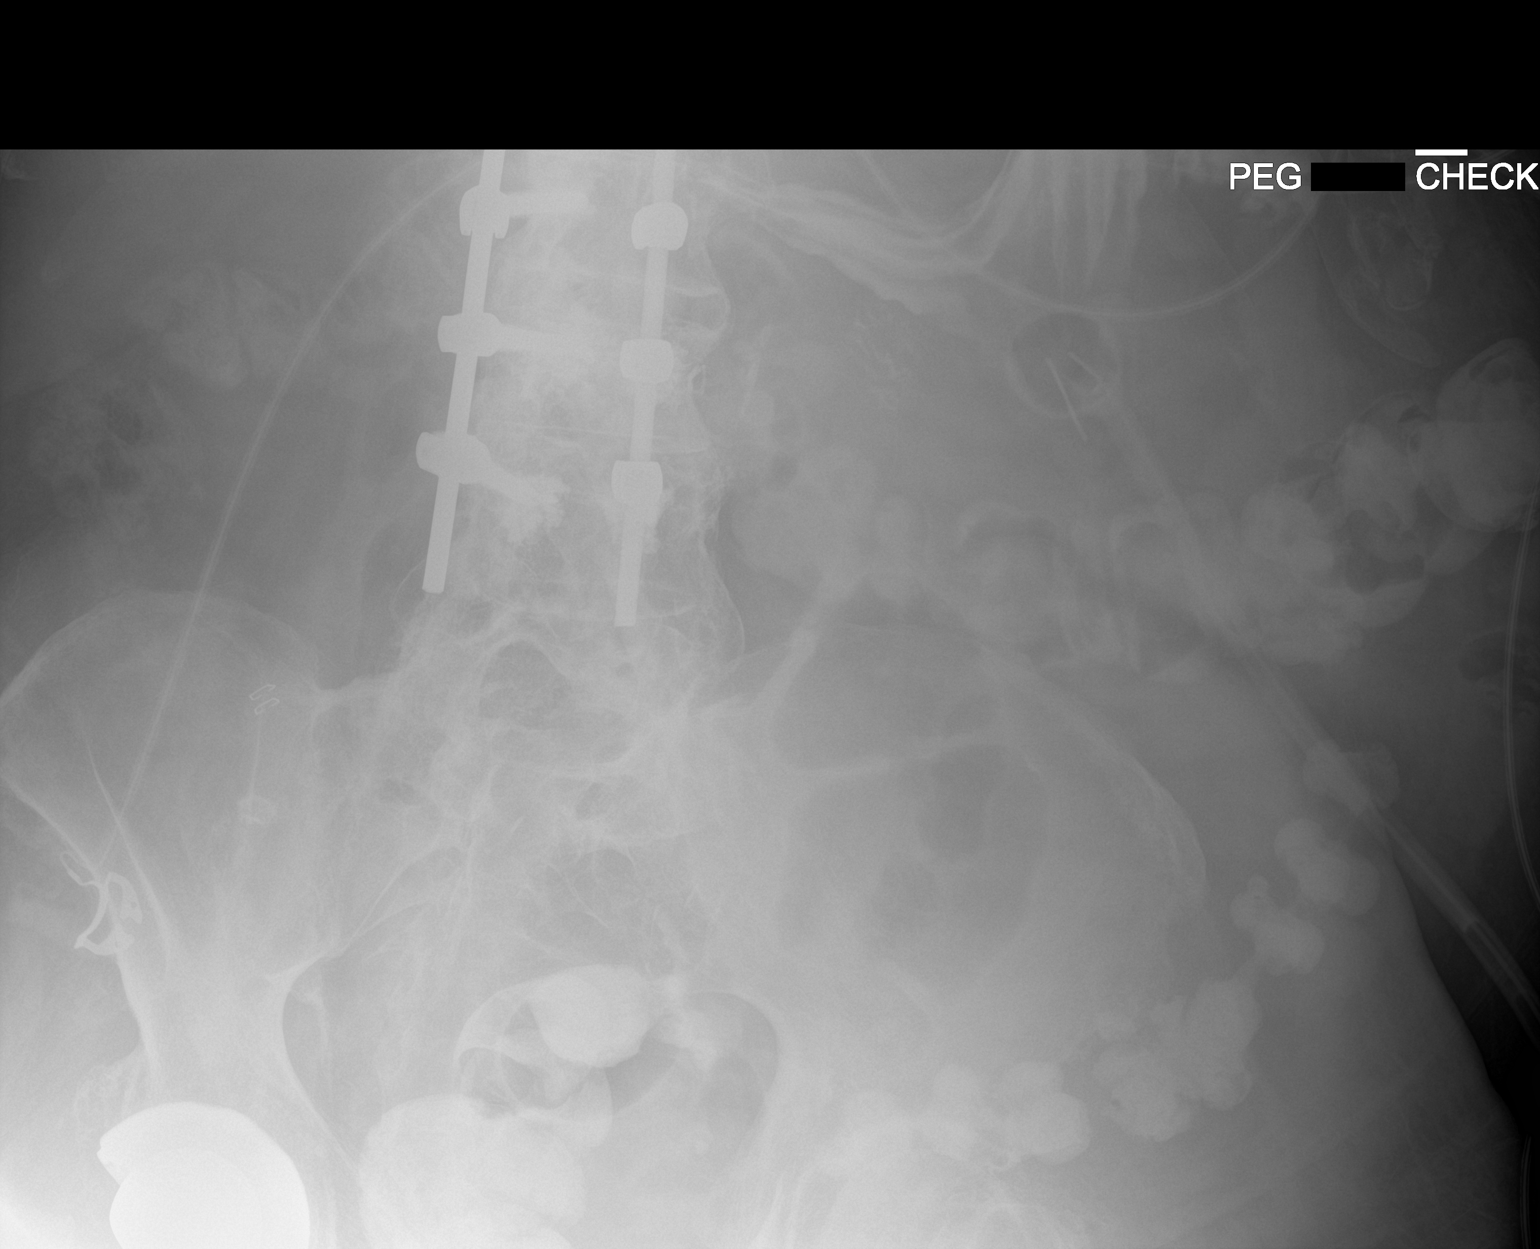

[1 of 1 positions shown; findings below may reference images not displayed]

FINDINGS: There is oral contrast material hand injected through the
gastrostomy tube opacifying the stomach. No extraluminal contrast is
observed. There is no bowel dilatation to suggest obstruction. There
is no evidence of pneumoperitoneum, portal venous gas or
pneumatosis.

There are no pathologic calcifications along the expected course of
the ureters.

There is posterior lumbar fusion hardware present. There is a right
hip arthroplasty.
IMPRESSION: Percutaneous gastrostomy tube in satisfactory position.

## 2020-11-28 IMAGING — DX DG CHEST 1V PORT
2 series · 2 of 2 positions shown · non-contrast
Comparison: Single-view of the chest 06/13/2019 and 06/07/2019.

CLINICAL DATA: Cough.  History of congestive heart failure.

EXAM:
PORTABLE CHEST 1 VIEW

[chest ap (1 of 2)]
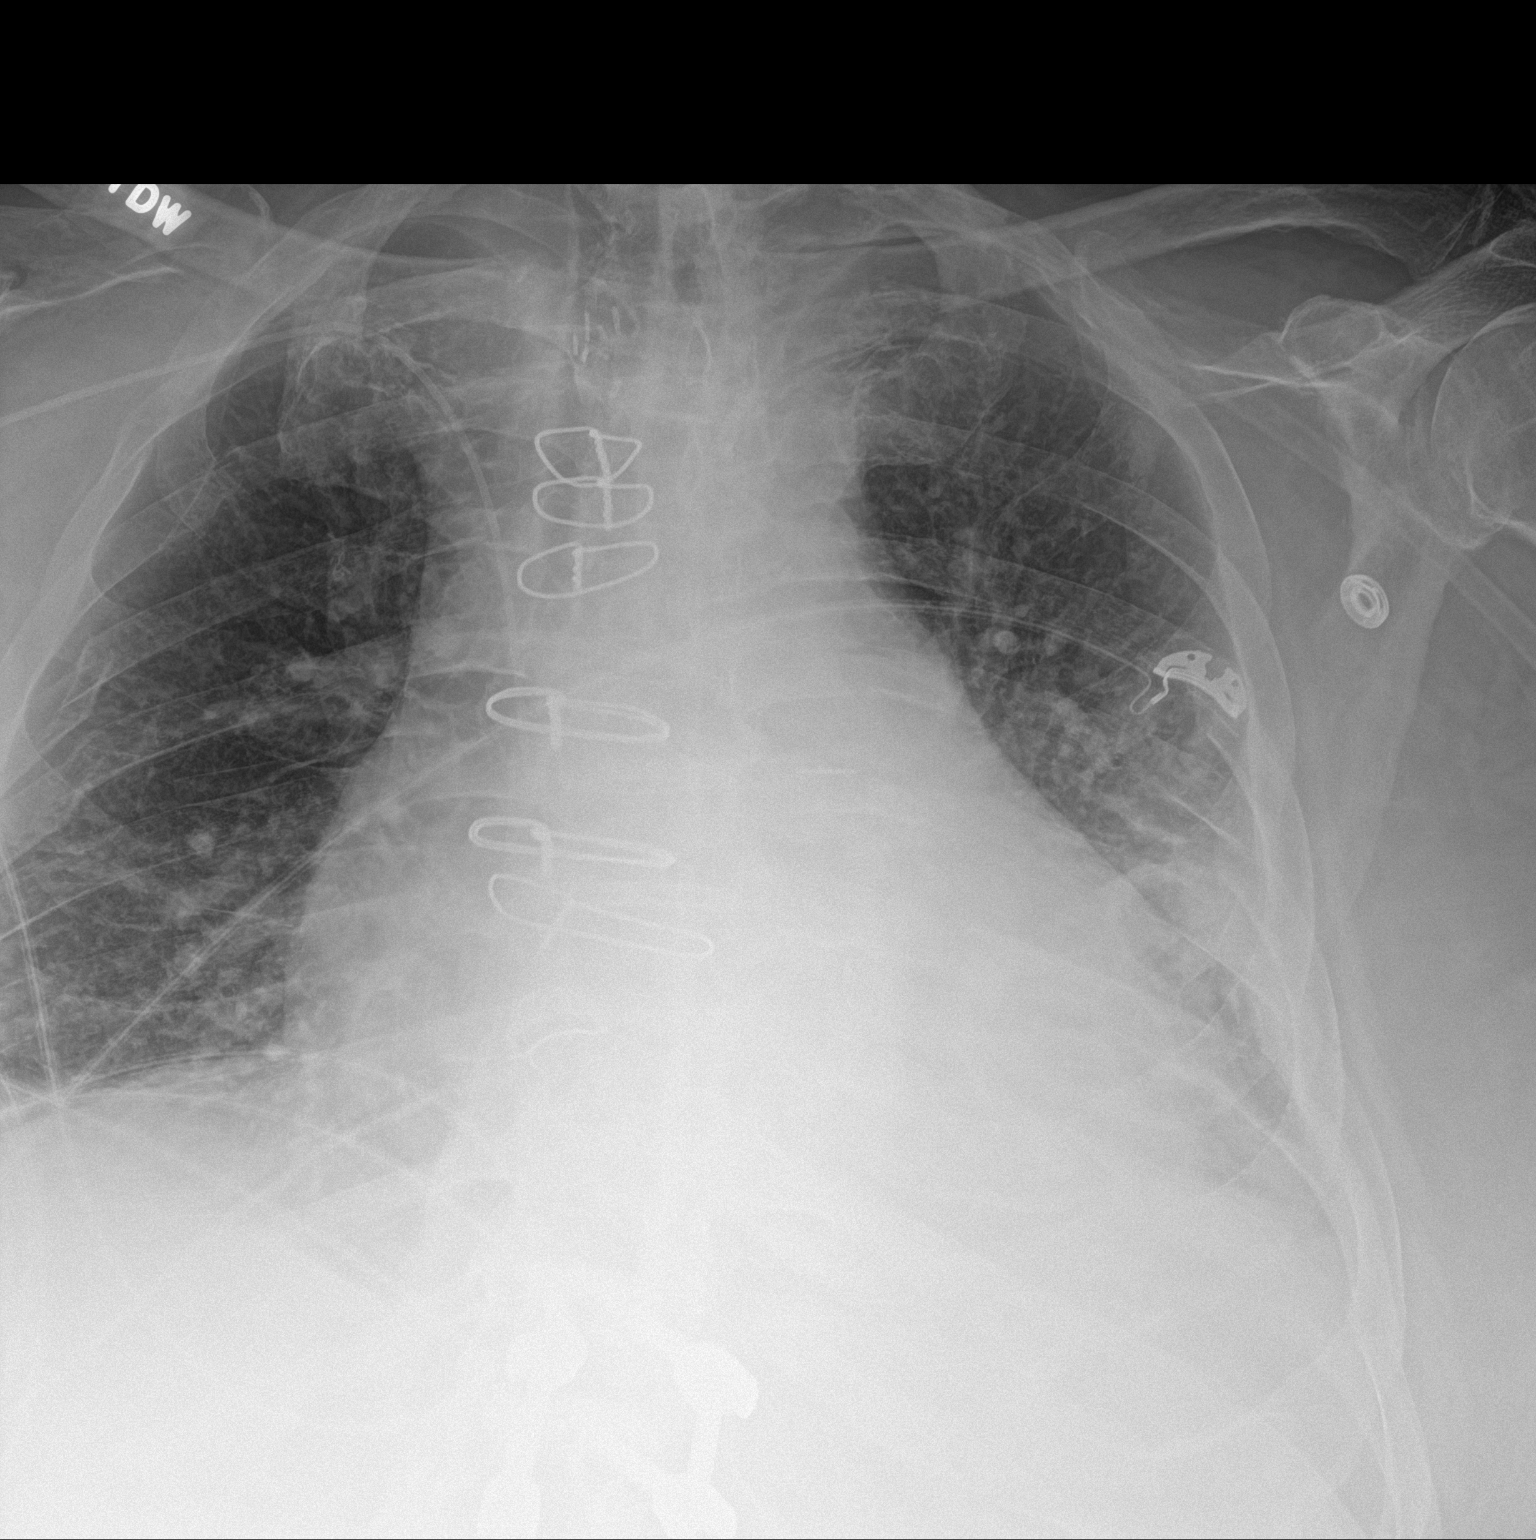

[chest ap (2 of 2)]
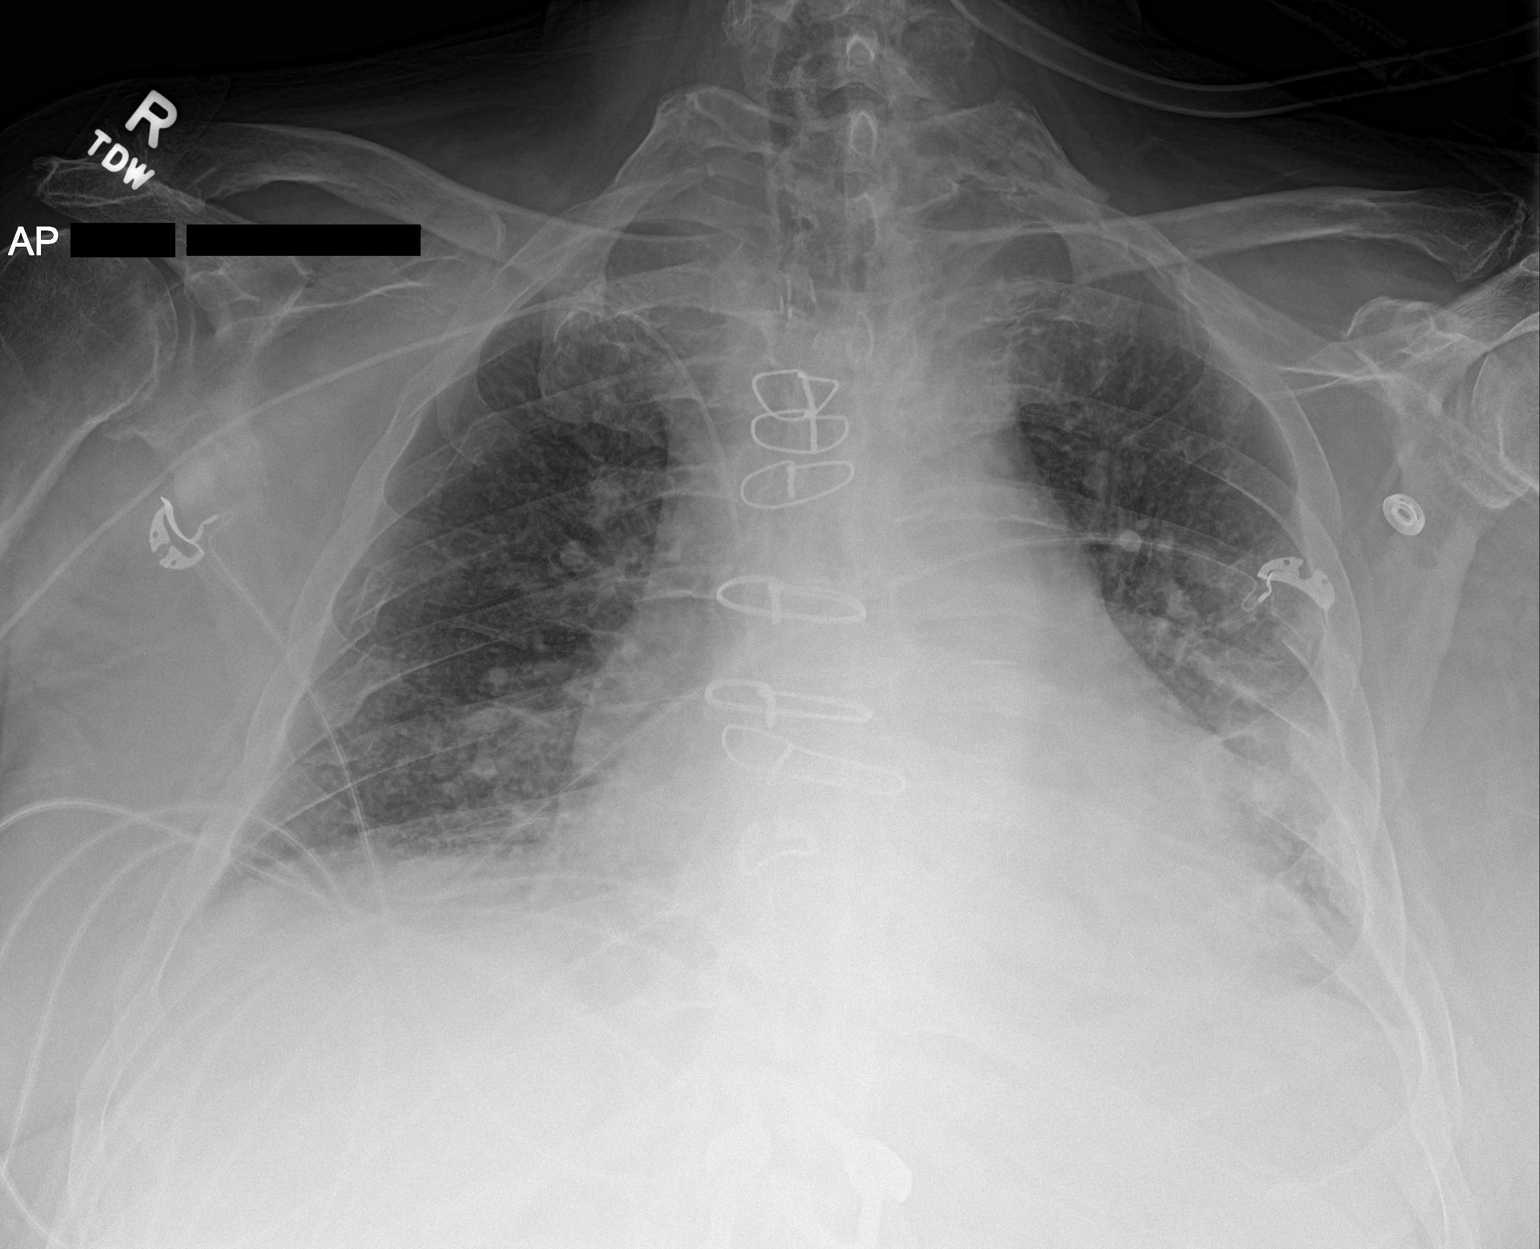

[2 of 2 positions shown; findings below may reference images not displayed]

FINDINGS: Right PICC remains in place, unchanged. There is cardiomegaly and
vascular congestion. Patchy airspace disease in the left lung base
is unchanged. Bilateral rib fractures are again seen. No
pneumothorax.
IMPRESSION: No change in patchy airspace disease in left lung base which could
be due to atelectasis or pneumonia.

Cardiomegaly.

Bilateral rib fractures.

## 2020-12-29 IMAGING — XA IR REPLACE G-TUBE/COLONIC TUBE
1 series · 1 of 1 positions shown · non-contrast
Comparison: none

INDICATION: Dislodged gastrostomy tube. Gastrostomy tube was originally placed
on 06/04/2019 for dysphagia and long term nutrition.

[Series 1: fl (-) angio · 1 of 1 slices shown]
[im 1/1]
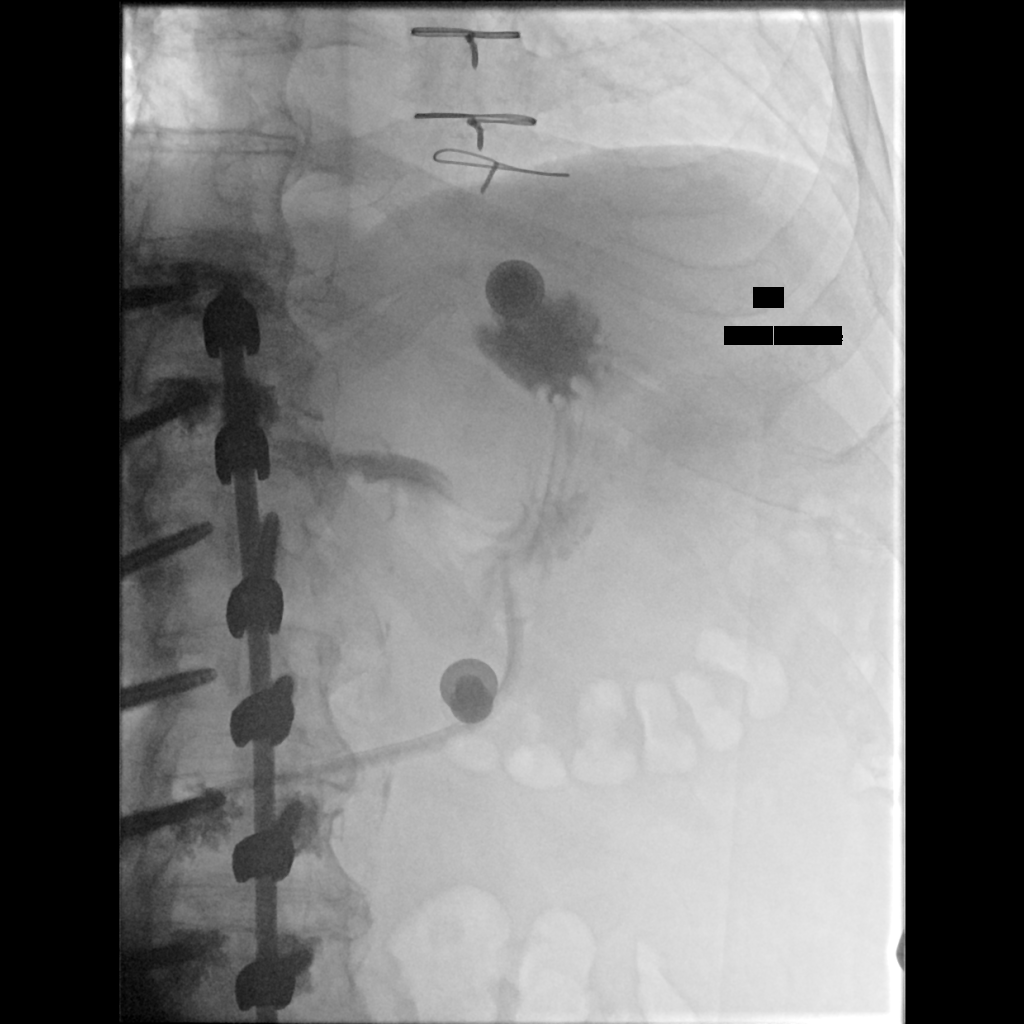

[1 of 1 positions shown; findings below may reference images not displayed]

EXAM:
REPLACEMENT OF GASTROSTOMY TUBE WITH FLUOROSCOPY

MEDICATIONS:
None

ANESTHESIA/SEDATION:
None

CONTRAST:  10 mL Omnipaque 300-administered into the gastric lumen.

FLUOROSCOPY TIME:  Fluoroscopy Time: 6 seconds, 2 mGy

COMPLICATIONS:
None immediate.

PROCEDURE:
Old gastrostomy tube was completely dislodged and not present. Old
gastrostomy tube site was prepped and draped in sterile fashion.
Maximal barrier sterile technique was utilized including caps, mask,
sterile gowns, sterile gloves, sterile drape, hand hygiene and skin
antiseptic. 20 French balloon retention tube was easily advanced
into the stomach. Contrast injection confirmed placement in the
stomach. The balloon was inflated with 8 mL of saline. Tube was
flushed with saline.

Fluoroscopic images were taken and saved for this procedure.
IMPRESSION: Successful replacement of the gastrostomy tube with fluoroscopy.
Patient now has a 20 French balloon retention gastrostomy tube.

## 2021-07-02 IMAGING — XA DG MYELOGRAPHY LUMBAR INJ MULTI REGION
12 of 18 series · 12 of 18 positions shown · non-contrast
Comparison: Noncontrast lumbar spine CT 05/22/2019

CLINICAL DATA: Left leg weakness. History of ankylosing spondylitis
and fracture through the L1-2 disc space treated with thoracolumbar
fusion.
TECHNIQUE: Contiguous axial images were obtained through the Thoracic and
Lumbar spine after the intrathecal infusion of infusion. Coronal and
sagittal reconstructions were obtained of the axial image sets.

[Series 1: vasc adipose · 1 of 1 slices shown (1 of 12)]
[im 1/1]
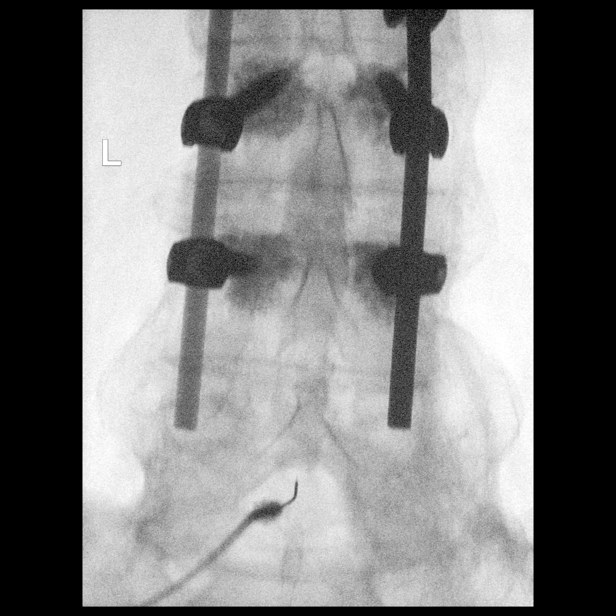

[Series 3: vasc adipose · 1 of 1 slices shown (2 of 12)]
[im 1/1]
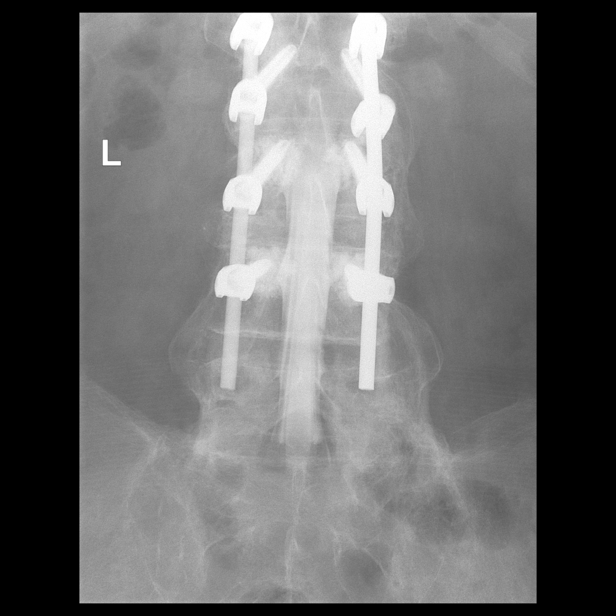

[Series 4: vasc adipose · 1 of 1 slices shown (3 of 12)]
[im 1/1]
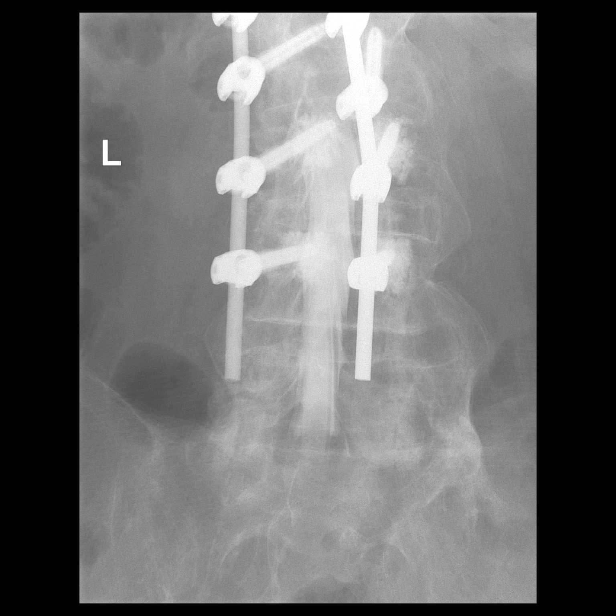

[Series 6: vasc adipose · 1 of 1 slices shown (4 of 12)]
[im 1/1]
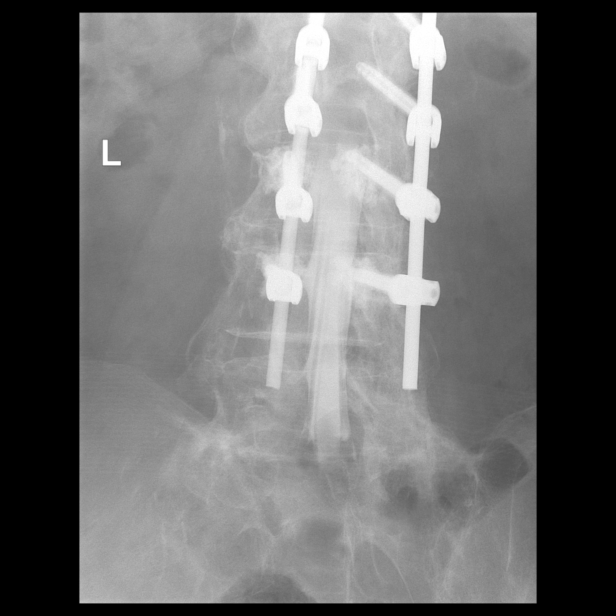

[Series 7: vasc adipose · 1 of 1 slices shown (5 of 12)]
[im 1/1]
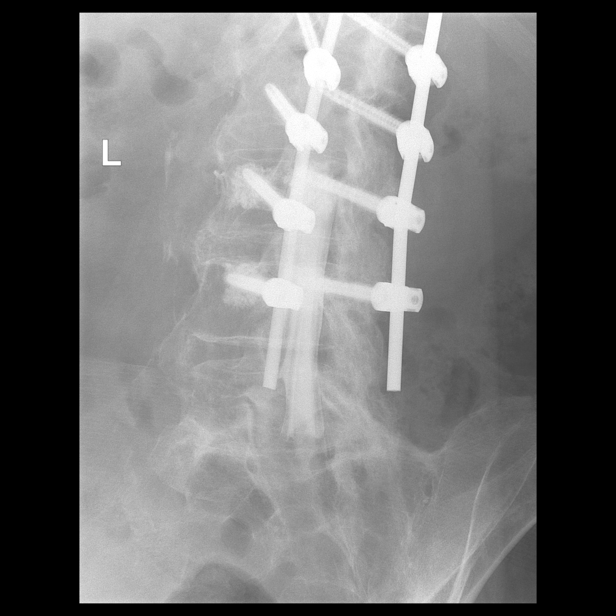

[Series 9: vasc adipose · 1 of 1 slices shown (6 of 12)]
[im 1/1]
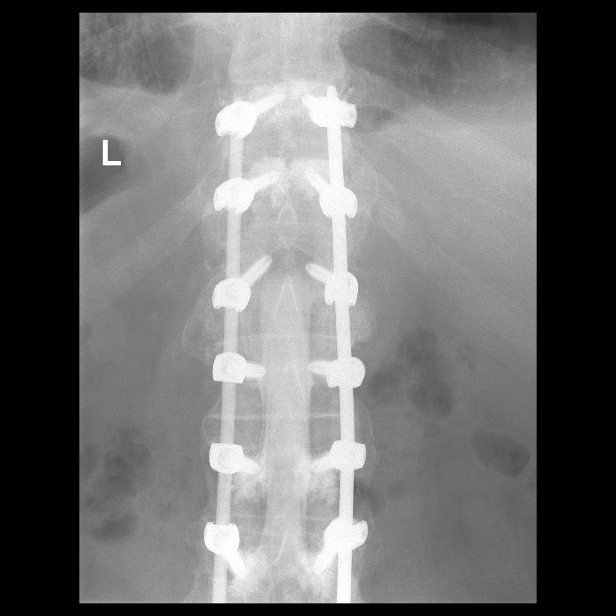

[Series 10: vasc adipose · 1 of 1 slices shown (7 of 12)]
[im 1/1]
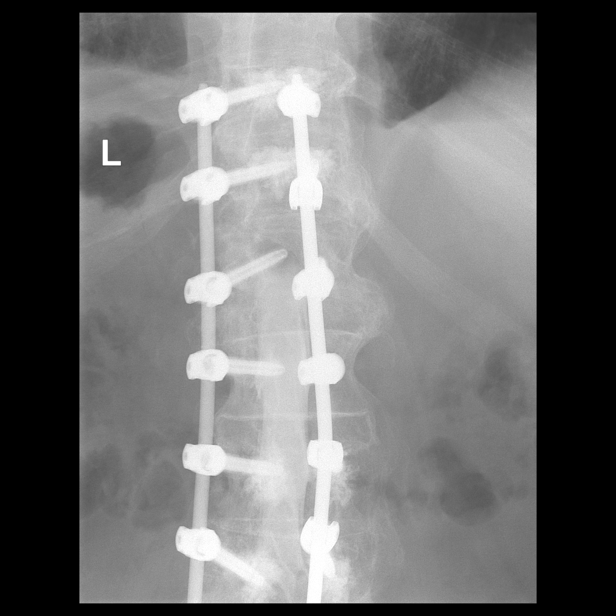

[Series 12: vasc adipose · 1 of 1 slices shown (8 of 12)]
[im 1/1]
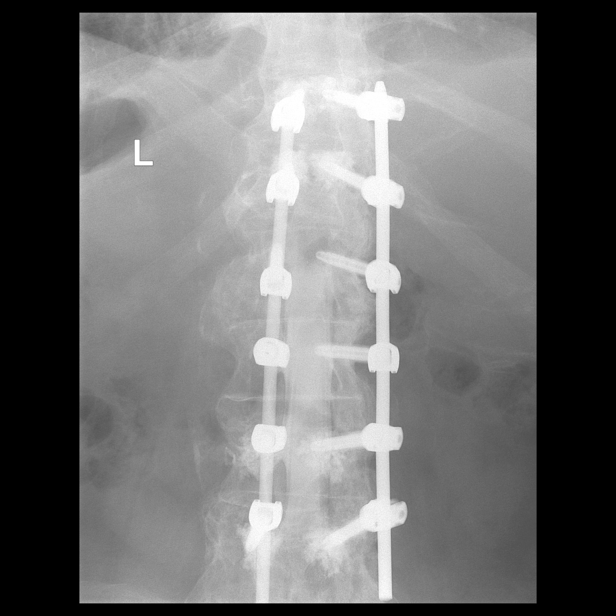

[Series 13: vasc adipose · 1 of 1 slices shown (9 of 12)]
[im 1/1]
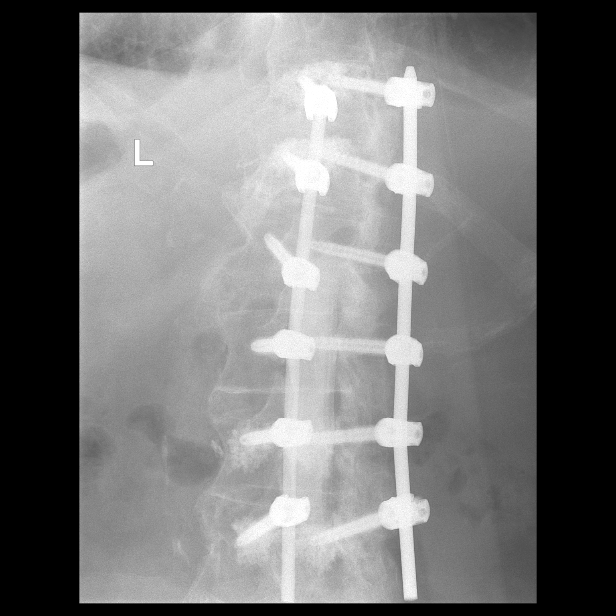

[Series 15: vasc adipose · 1 of 1 slices shown (10 of 12)]
[im 1/1]
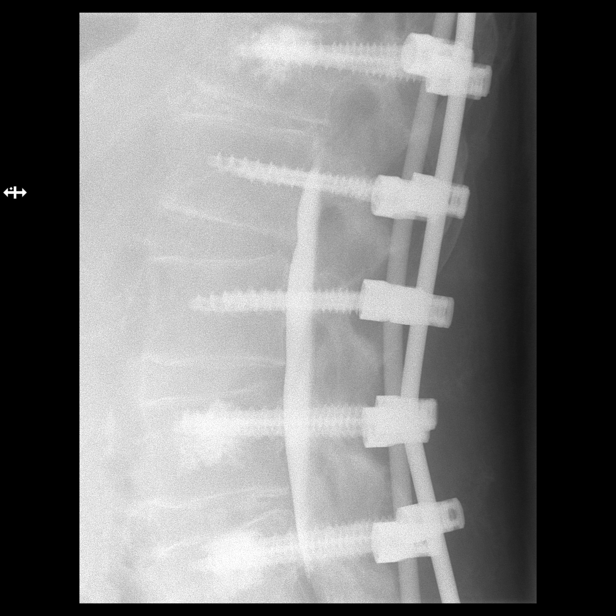

[Series 16: vasc adipose · 1 of 1 slices shown (11 of 12)]
[im 1/1]
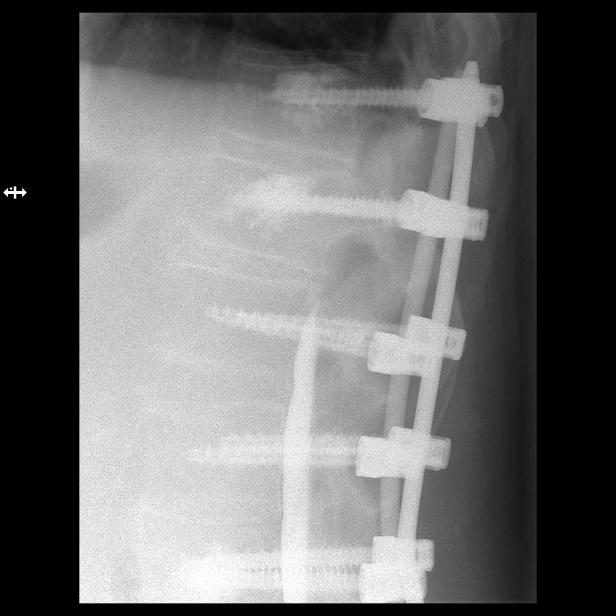

[Series 18: vasc adipose · 1 of 1 slices shown (12 of 12)]
[im 1/1]
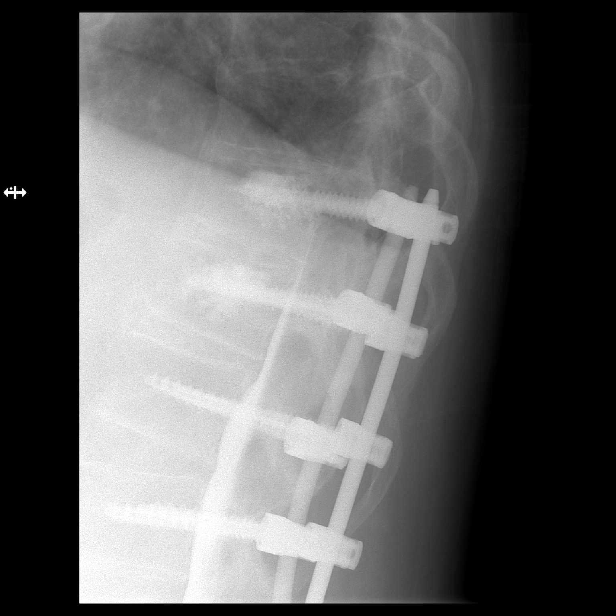

[12 of 18 positions shown; findings below may reference images not displayed]

FLUOROSCOPY TIME:  Fluoroscopy Time: 53 seconds

Radiation Exposure Index: 591.20 microGray*m^2

PROCEDURE:
LUMBAR PUNCTURE FOR THORACIC AND LUMBAR MYELOGRAM

After thorough discussion of risks and benefits of the procedure
including bleeding, infection, injury to nerves, blood vessels,
adjacent structures as well as headache and CSF leak, written and
oral informed consent was obtained. Consent was obtained by Dr.
Moisiadis Goseni.

Patient was positioned prone on the fluoroscopy table. Local
anesthesia was provided with 1% lidocaine without epinephrine after
prepped and draped in the usual sterile fashion. Puncture was
performed at L5-S1 using a 3 1/2 inch 22-gauge spinal needle via a
midline approach. Using a single pass through the dura, the needle
was placed within the thecal sac, with return of clear CSF. 10 mL of
Isovue L-EWW was injected into the thecal sac, with normal
opacification of the nerve roots and cauda equina consistent with
free flow within the subarachnoid space. The patient was then moved
to the trendelenburg position and contrast flowed into the Thoracic
spine region.

I personally performed the lumbar puncture and administered the
intrathecal contrast. I also personally supervised acquisition of
the myelogram images.
FINDINGS: THORACIC AND LUMBAR MYELOGRAM FINDINGS:

There are 5 non rib-bearing lumbar type vertebrae. Lumbar vertebral
alignment is normal. Sequelae of T11-L4 posterior fusion are
identified. The lumbar spinal canal appears widely patent without
evidence of nerve root cut off. With the patient prone and in the
Trendelenburg position, contrast was visible to the T11 level but no
higher. The patient could not tolerate attempts to move the contrast
higher in the thoracic spine via alternative positioning due to pain
and limited mobility.

CT THORACIC MYELOGRAM FINDINGS:

There is exaggerated thoracic kyphosis without listhesis. No
fracture or suspicious osseous lesion is identified. There is
diffuse thoracic spine ankylosis by bridging syndesmophytes, and
there is diffuse supraspinous ligament ossification beginning at the
T3 level. There is partial ankylosis of multiple facet joints as
well.

There has been interval posterior fusion from T11-L4. Pedicle screws
at T11 and T12 appear well-positioned without evidence of loosening.
There is cement in the T11 and T12 vertebral bodies adjacent to the
screws with a small amount of cement noted in the ventral epidural
space at T11.

There are small bilateral pleural effusions, left larger than right.
Mild atelectasis is noted in the lower lobes. Hyperdense material in
the right upper lobe appears to be associated with a vessel and may
represent a small amount of embolized methylmethacrylate. There is
aortic and coronary artery atherosclerosis, and the heart is
enlarged.

There is excellent subarachnoid space opacification on CT. No disc
herniation or spinal stenosis is evident. There is scattered mild
osseous neural foraminal stenosis, for example on the left at T1-2,
on the right at T2-3, and on the right at T9-10.

CT LUMBAR MYELOGRAM FINDINGS:

Trace retrolisthesis of L5 on S1 is unchanged. A transdiscal
fracture is again seen at L1-2 with unchanged anterior disc space
widening. There has been interval healing of the fracture through
the bridging syndesmophytes/osteophytes with a largely healed
appearance on the right and with a fracture line still partially
visible on the left. No new fracture or suspicious osseous lesion is
identified. Bridging syndesmophytes/osteophytes are again noted
throughout the lumbar spine with widespread facet and bilateral
sacroiliac ankylosis as well.

Pedicle screws are in place bilaterally from L1-L4 and appear
well-positioned without evidence of loosening. Cement is noted in
the L3 and L4 vertebral bodies adjacent to the screws without
evidence of extravasation.

The conus medullaris terminates at L2. The cauda equina is
unremarkable. Abdominal aortic atherosclerosis is noted without
aneurysm. A right renal cyst is partially visualized.

There is a small central disc osteophyte complex at T12-L1 without
spinal stenosis. Osseous neural foraminal stenosis is moderate
bilaterally at L1-2 and moderate to severe on the right and moderate
on the left at L5-S1. Disc bulging is noted at L5-S1 without
significant spinal stenosis status post presumed remote posterior
decompression.
IMPRESSION: 1. Healing L1-2 transdiscal fracture with unchanged anterior disc
space widening.
2. Interval T11-L4 posterior fusion.
3. No thoracic or lumbar spinal stenosis.
4. Moderate to severe neural foraminal stenosis at L5-S1.
5. Moderate neural foraminal stenosis at L1-2.
6. Mild multilevel neural foraminal stenosis in the thoracic spine.
7. Findings suggestive of ankylosing spondylitis.
8. Small bilateral pleural effusions.
9. Aortic Atherosclerosis ([id number]).

## 2021-07-02 IMAGING — CT CT T SPINE W/ CM
1 series · 12 of 14 positions shown, 15 images · non-contrast
Comparison: Noncontrast lumbar spine CT 05/22/2019

CLINICAL DATA: Left leg weakness. History of ankylosing spondylitis
and fracture through the L1-2 disc space treated with thoracolumbar
fusion.
TECHNIQUE: Contiguous axial images were obtained through the Thoracic and
Lumbar spine after the intrathecal infusion of infusion. Coronal and
sagittal reconstructions were obtained of the axial image sets.

[Series 3: t spine soft (person_name) · axial · 0.42mm/px · z∈[-317,-20]mm · 12 of 118 slices shown, 15 images]
[im 10/118  soft-tissue]
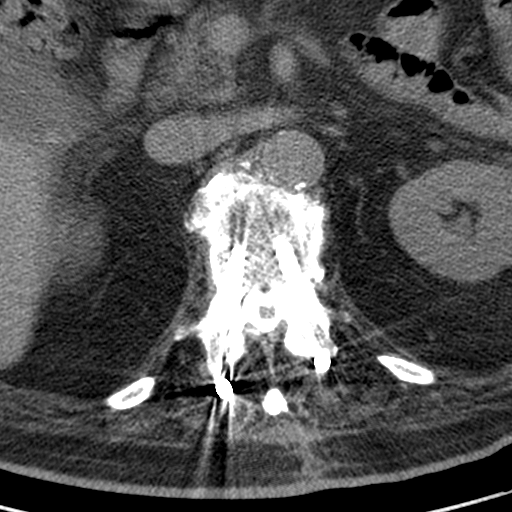
[im 10/118  bone]
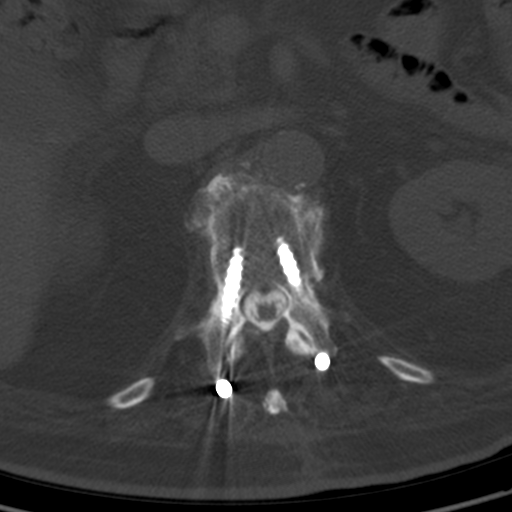
[im 19/118  bone]
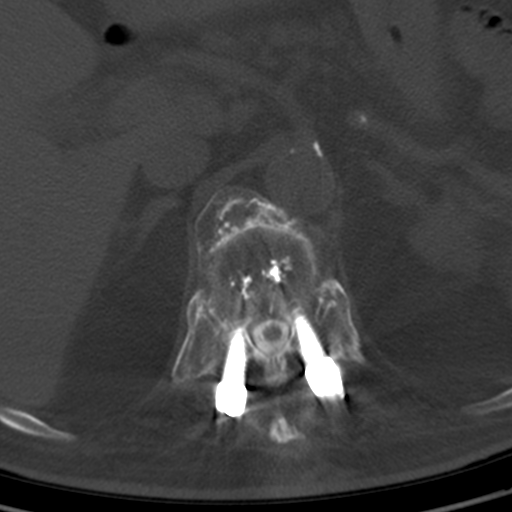
[im 28/118  bone]
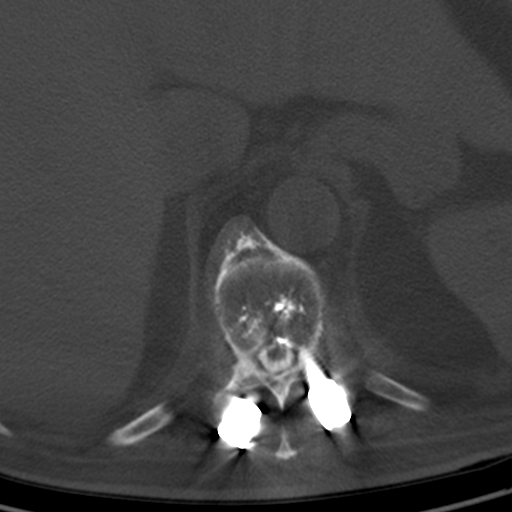
[im 37/118  bone]
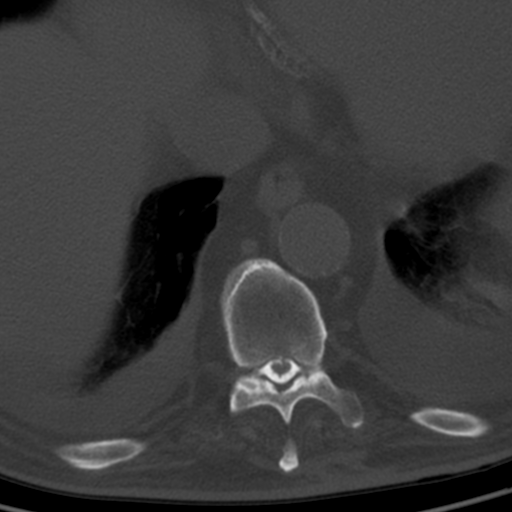
[im 46/118  soft-tissue]
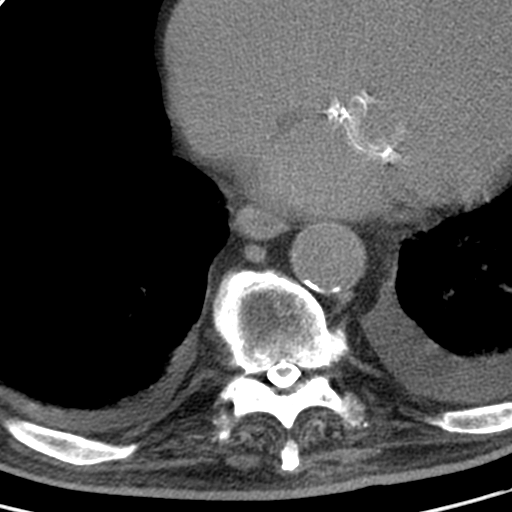
[im 46/118  bone]
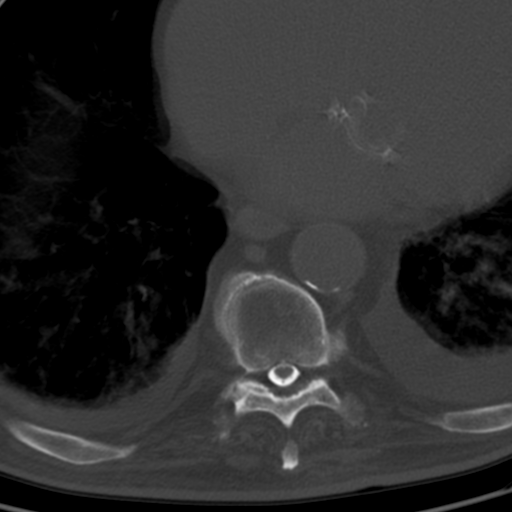
[im 55/118  bone]
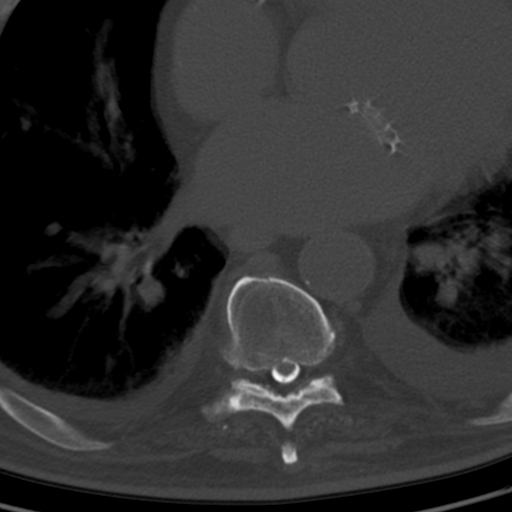
[im 64/118  bone]
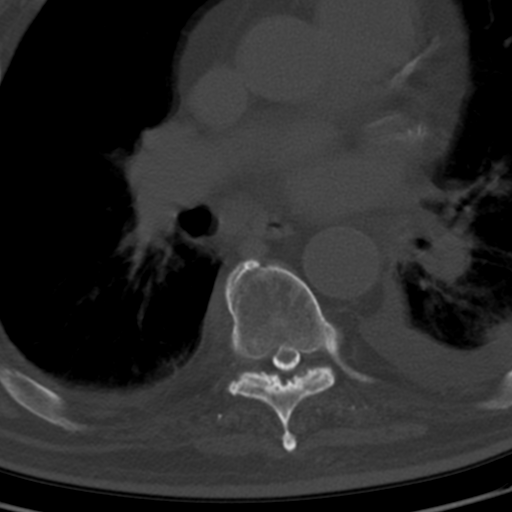
[im 73/118  bone]
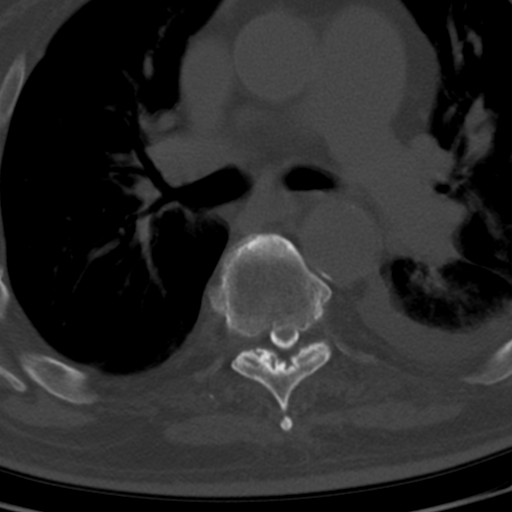
[im 82/118  soft-tissue]
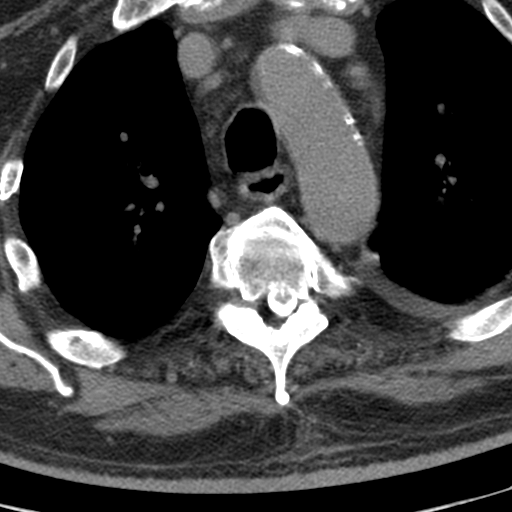
[im 82/118  bone]
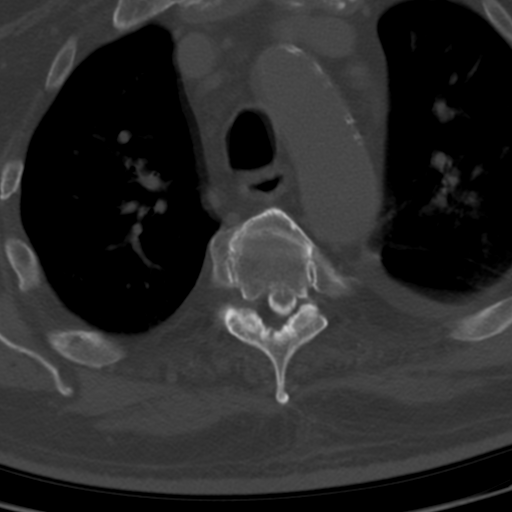
[im 91/118  bone]
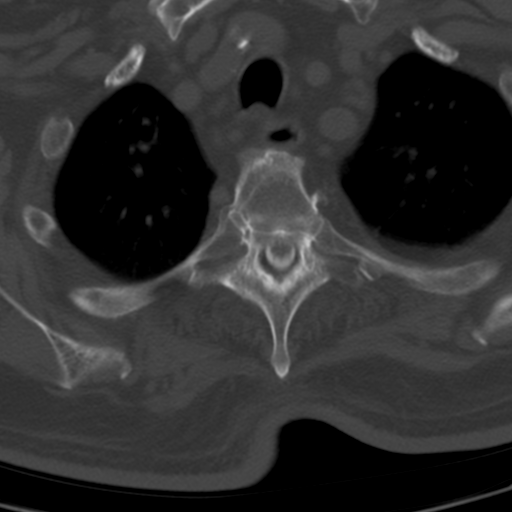
[im 100/118  bone]
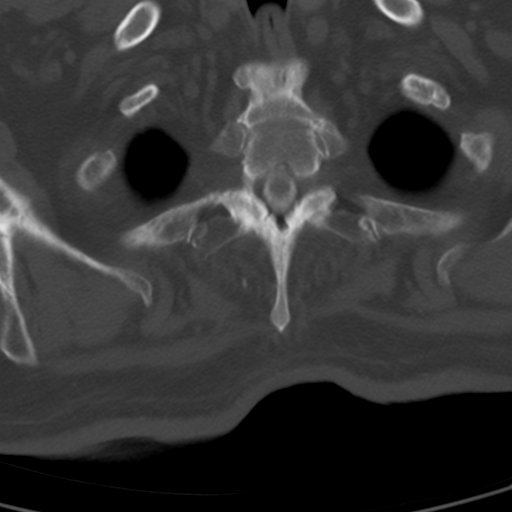
[im 109/118  bone]
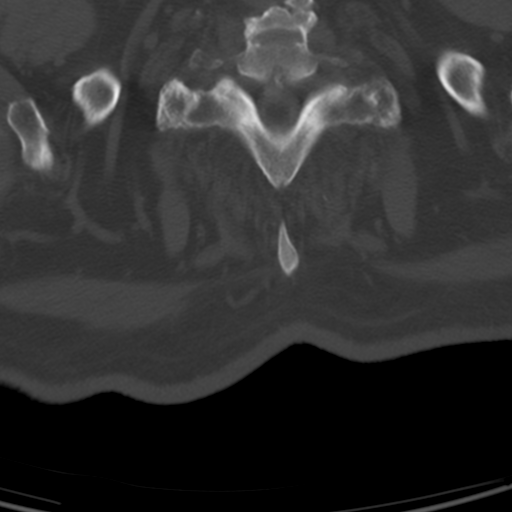

[12 of 14 positions shown; findings below may reference images not displayed]

FLUOROSCOPY TIME:  Fluoroscopy Time: 53 seconds

Radiation Exposure Index: 591.20 microGray*m^2

PROCEDURE:
LUMBAR PUNCTURE FOR THORACIC AND LUMBAR MYELOGRAM

After thorough discussion of risks and benefits of the procedure
including bleeding, infection, injury to nerves, blood vessels,
adjacent structures as well as headache and CSF leak, written and
oral informed consent was obtained. Consent was obtained by Dr.
Moisiadis Goseni.

Patient was positioned prone on the fluoroscopy table. Local
anesthesia was provided with 1% lidocaine without epinephrine after
prepped and draped in the usual sterile fashion. Puncture was
performed at L5-S1 using a 3 1/2 inch 22-gauge spinal needle via a
midline approach. Using a single pass through the dura, the needle
was placed within the thecal sac, with return of clear CSF. 10 mL of
Isovue L-EWW was injected into the thecal sac, with normal
opacification of the nerve roots and cauda equina consistent with
free flow within the subarachnoid space. The patient was then moved
to the trendelenburg position and contrast flowed into the Thoracic
spine region.

I personally performed the lumbar puncture and administered the
intrathecal contrast. I also personally supervised acquisition of
the myelogram images.
FINDINGS: THORACIC AND LUMBAR MYELOGRAM FINDINGS:

There are 5 non rib-bearing lumbar type vertebrae. Lumbar vertebral
alignment is normal. Sequelae of T11-L4 posterior fusion are
identified. The lumbar spinal canal appears widely patent without
evidence of nerve root cut off. With the patient prone and in the
Trendelenburg position, contrast was visible to the T11 level but no
higher. The patient could not tolerate attempts to move the contrast
higher in the thoracic spine via alternative positioning due to pain
and limited mobility.

CT THORACIC MYELOGRAM FINDINGS:

There is exaggerated thoracic kyphosis without listhesis. No
fracture or suspicious osseous lesion is identified. There is
diffuse thoracic spine ankylosis by bridging syndesmophytes, and
there is diffuse supraspinous ligament ossification beginning at the
T3 level. There is partial ankylosis of multiple facet joints as
well.

There has been interval posterior fusion from T11-L4. Pedicle screws
at T11 and T12 appear well-positioned without evidence of loosening.
There is cement in the T11 and T12 vertebral bodies adjacent to the
screws with a small amount of cement noted in the ventral epidural
space at T11.

There are small bilateral pleural effusions, left larger than right.
Mild atelectasis is noted in the lower lobes. Hyperdense material in
the right upper lobe appears to be associated with a vessel and may
represent a small amount of embolized methylmethacrylate. There is
aortic and coronary artery atherosclerosis, and the heart is
enlarged.

There is excellent subarachnoid space opacification on CT. No disc
herniation or spinal stenosis is evident. There is scattered mild
osseous neural foraminal stenosis, for example on the left at T1-2,
on the right at T2-3, and on the right at T9-10.

CT LUMBAR MYELOGRAM FINDINGS:

Trace retrolisthesis of L5 on S1 is unchanged. A transdiscal
fracture is again seen at L1-2 with unchanged anterior disc space
widening. There has been interval healing of the fracture through
the bridging syndesmophytes/osteophytes with a largely healed
appearance on the right and with a fracture line still partially
visible on the left. No new fracture or suspicious osseous lesion is
identified. Bridging syndesmophytes/osteophytes are again noted
throughout the lumbar spine with widespread facet and bilateral
sacroiliac ankylosis as well.

Pedicle screws are in place bilaterally from L1-L4 and appear
well-positioned without evidence of loosening. Cement is noted in
the L3 and L4 vertebral bodies adjacent to the screws without
evidence of extravasation.

The conus medullaris terminates at L2. The cauda equina is
unremarkable. Abdominal aortic atherosclerosis is noted without
aneurysm. A right renal cyst is partially visualized.

There is a small central disc osteophyte complex at T12-L1 without
spinal stenosis. Osseous neural foraminal stenosis is moderate
bilaterally at L1-2 and moderate to severe on the right and moderate
on the left at L5-S1. Disc bulging is noted at L5-S1 without
significant spinal stenosis status post presumed remote posterior
decompression.
IMPRESSION: 1. Healing L1-2 transdiscal fracture with unchanged anterior disc
space widening.
2. Interval T11-L4 posterior fusion.
3. No thoracic or lumbar spinal stenosis.
4. Moderate to severe neural foraminal stenosis at L5-S1.
5. Moderate neural foraminal stenosis at L1-2.
6. Mild multilevel neural foraminal stenosis in the thoracic spine.
7. Findings suggestive of ankylosing spondylitis.
8. Small bilateral pleural effusions.
9. Aortic Atherosclerosis ([id number]).

## 2021-07-02 IMAGING — CT CT L SPINE W/ CM
1 of 6 series · 5 of 14 positions shown, 7 images · non-contrast
Comparison: Noncontrast lumbar spine CT 05/22/2019

CLINICAL DATA: Left leg weakness. History of ankylosing spondylitis
and fracture through the L1-2 disc space treated with thoracolumbar
fusion.
TECHNIQUE: Contiguous axial images were obtained through the Thoracic and
Lumbar spine after the intrathecal infusion of infusion. Coronal and
sagittal reconstructions were obtained of the axial image sets.

[Series 2: l spine soft (person_name) · axial · 0.35mm/px · z∈[-269,-77]mm · 5 of 96 slices shown, 7 images]
[im 16/96  soft-tissue]
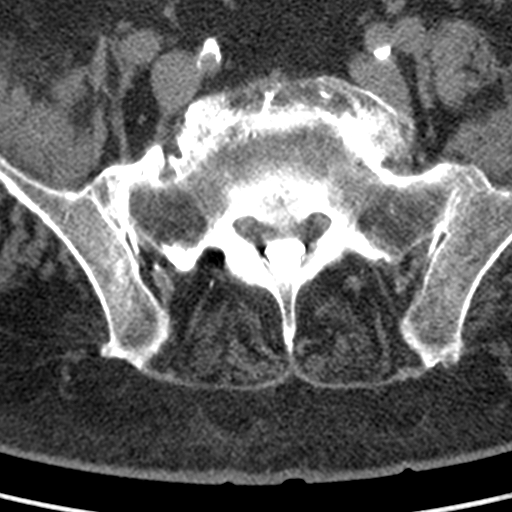
[im 16/96  bone]
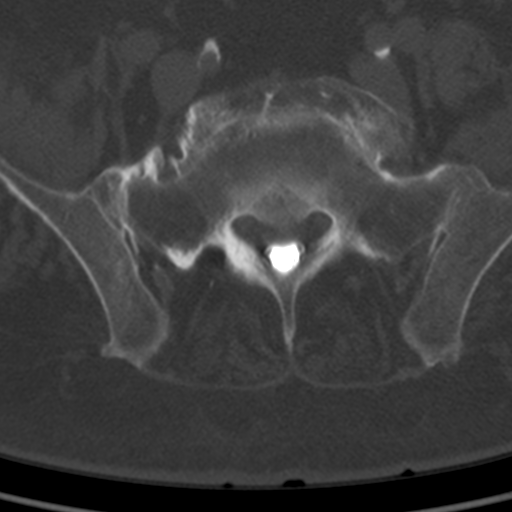
[im 32/96  bone]
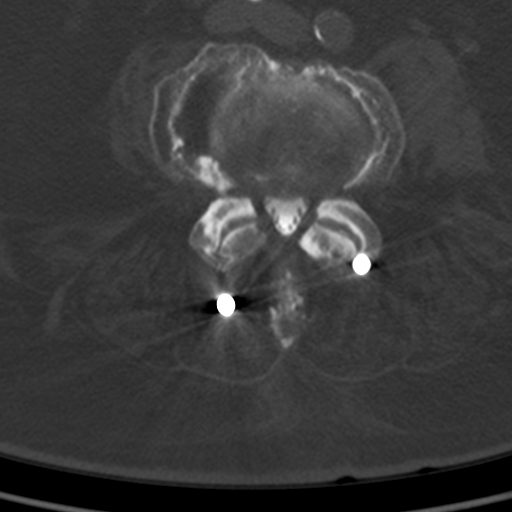
[im 48/96  bone]
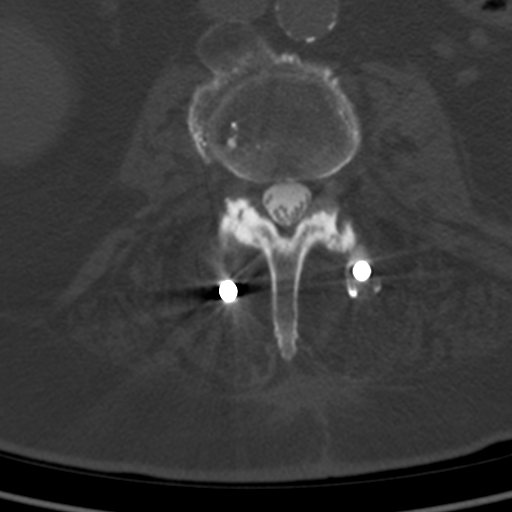
[im 64/96  bone]
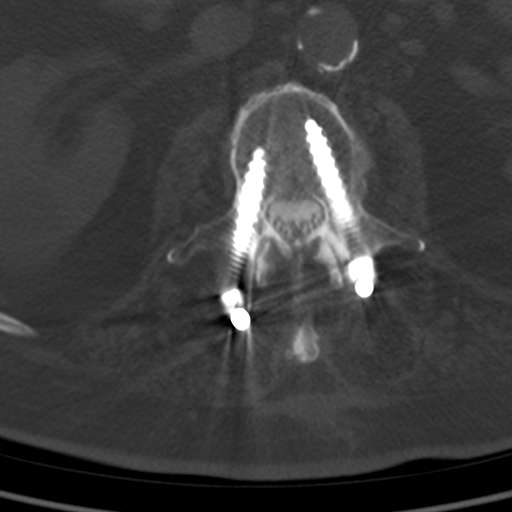
[im 80/96  soft-tissue]
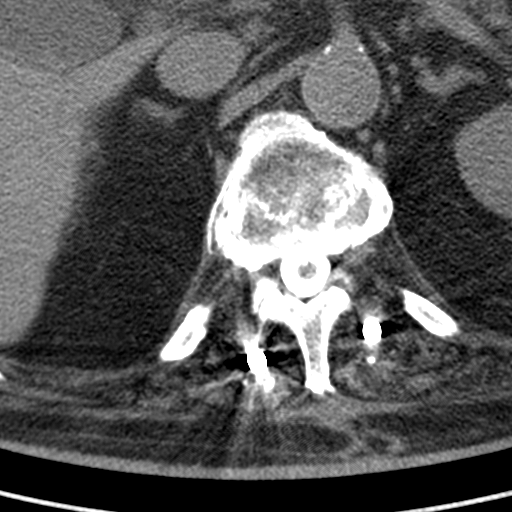
[im 80/96  bone]
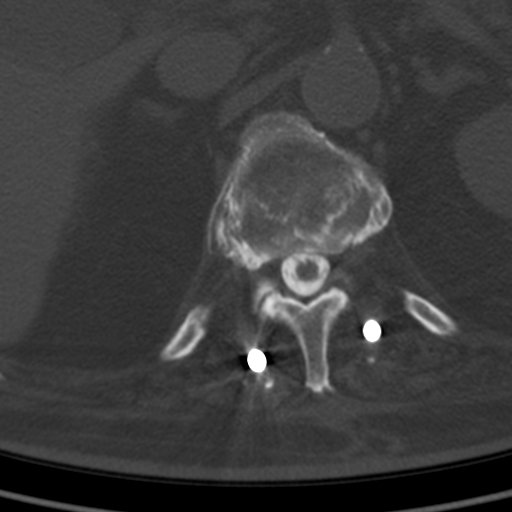

[5 of 14 positions shown; findings below may reference images not displayed]

FLUOROSCOPY TIME:  Fluoroscopy Time: 53 seconds

Radiation Exposure Index: 591.20 microGray*m^2

PROCEDURE:
LUMBAR PUNCTURE FOR THORACIC AND LUMBAR MYELOGRAM

After thorough discussion of risks and benefits of the procedure
including bleeding, infection, injury to nerves, blood vessels,
adjacent structures as well as headache and CSF leak, written and
oral informed consent was obtained. Consent was obtained by Dr.
Moisiadis Goseni.

Patient was positioned prone on the fluoroscopy table. Local
anesthesia was provided with 1% lidocaine without epinephrine after
prepped and draped in the usual sterile fashion. Puncture was
performed at L5-S1 using a 3 1/2 inch 22-gauge spinal needle via a
midline approach. Using a single pass through the dura, the needle
was placed within the thecal sac, with return of clear CSF. 10 mL of
Isovue L-EWW was injected into the thecal sac, with normal
opacification of the nerve roots and cauda equina consistent with
free flow within the subarachnoid space. The patient was then moved
to the trendelenburg position and contrast flowed into the Thoracic
spine region.

I personally performed the lumbar puncture and administered the
intrathecal contrast. I also personally supervised acquisition of
the myelogram images.
FINDINGS: THORACIC AND LUMBAR MYELOGRAM FINDINGS:

There are 5 non rib-bearing lumbar type vertebrae. Lumbar vertebral
alignment is normal. Sequelae of T11-L4 posterior fusion are
identified. The lumbar spinal canal appears widely patent without
evidence of nerve root cut off. With the patient prone and in the
Trendelenburg position, contrast was visible to the T11 level but no
higher. The patient could not tolerate attempts to move the contrast
higher in the thoracic spine via alternative positioning due to pain
and limited mobility.

CT THORACIC MYELOGRAM FINDINGS:

There is exaggerated thoracic kyphosis without listhesis. No
fracture or suspicious osseous lesion is identified. There is
diffuse thoracic spine ankylosis by bridging syndesmophytes, and
there is diffuse supraspinous ligament ossification beginning at the
T3 level. There is partial ankylosis of multiple facet joints as
well.

There has been interval posterior fusion from T11-L4. Pedicle screws
at T11 and T12 appear well-positioned without evidence of loosening.
There is cement in the T11 and T12 vertebral bodies adjacent to the
screws with a small amount of cement noted in the ventral epidural
space at T11.

There are small bilateral pleural effusions, left larger than right.
Mild atelectasis is noted in the lower lobes. Hyperdense material in
the right upper lobe appears to be associated with a vessel and may
represent a small amount of embolized methylmethacrylate. There is
aortic and coronary artery atherosclerosis, and the heart is
enlarged.

There is excellent subarachnoid space opacification on CT. No disc
herniation or spinal stenosis is evident. There is scattered mild
osseous neural foraminal stenosis, for example on the left at T1-2,
on the right at T2-3, and on the right at T9-10.

CT LUMBAR MYELOGRAM FINDINGS:

Trace retrolisthesis of L5 on S1 is unchanged. A transdiscal
fracture is again seen at L1-2 with unchanged anterior disc space
widening. There has been interval healing of the fracture through
the bridging syndesmophytes/osteophytes with a largely healed
appearance on the right and with a fracture line still partially
visible on the left. No new fracture or suspicious osseous lesion is
identified. Bridging syndesmophytes/osteophytes are again noted
throughout the lumbar spine with widespread facet and bilateral
sacroiliac ankylosis as well.

Pedicle screws are in place bilaterally from L1-L4 and appear
well-positioned without evidence of loosening. Cement is noted in
the L3 and L4 vertebral bodies adjacent to the screws without
evidence of extravasation.

The conus medullaris terminates at L2. The cauda equina is
unremarkable. Abdominal aortic atherosclerosis is noted without
aneurysm. A right renal cyst is partially visualized.

There is a small central disc osteophyte complex at T12-L1 without
spinal stenosis. Osseous neural foraminal stenosis is moderate
bilaterally at L1-2 and moderate to severe on the right and moderate
on the left at L5-S1. Disc bulging is noted at L5-S1 without
significant spinal stenosis status post presumed remote posterior
decompression.
IMPRESSION: 1. Healing L1-2 transdiscal fracture with unchanged anterior disc
space widening.
2. Interval T11-L4 posterior fusion.
3. No thoracic or lumbar spinal stenosis.
4. Moderate to severe neural foraminal stenosis at L5-S1.
5. Moderate neural foraminal stenosis at L1-2.
6. Mild multilevel neural foraminal stenosis in the thoracic spine.
7. Findings suggestive of ankylosing spondylitis.
8. Small bilateral pleural effusions.
9. Aortic Atherosclerosis ([id number]).

## 2021-07-24 IMAGING — CT CT HEAD W/O CM
3 series · 16 of 47 positions shown, 19 images · non-contrast
Comparison: Prior head CT examinations 05/19/2018 and earlier

CLINICAL DATA: Head trauma, minor. Additional provided: Fall in
shower today, hitting head.

EXAM:
CT HEAD WITHOUT CONTRAST
TECHNIQUE: Contiguous axial images were obtained from the base of the skull
through the vertex without intravenous contrast.

[Series 2: head w o · axial · 0.42mm/px · z∈[+20,+145]mm · 10 of 31 slices shown, 13 images]
[im 3/31  brain]
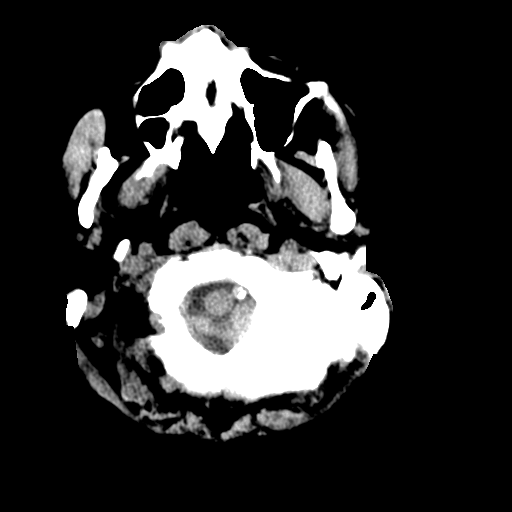
[im 3/31  bone]
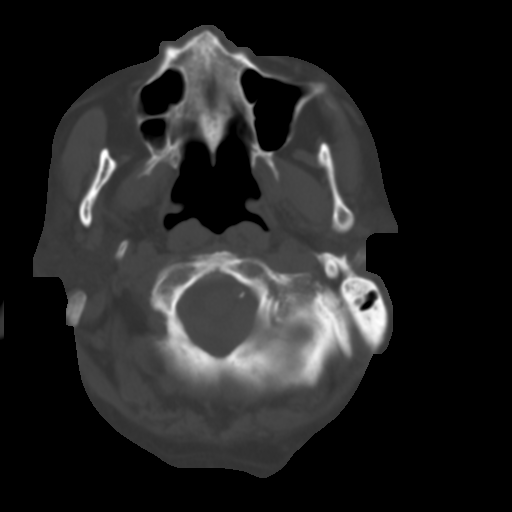
[im 6/31  brain]
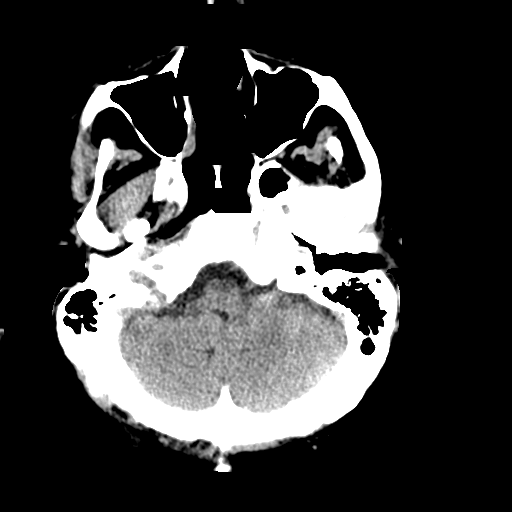
[im 9/31  brain]
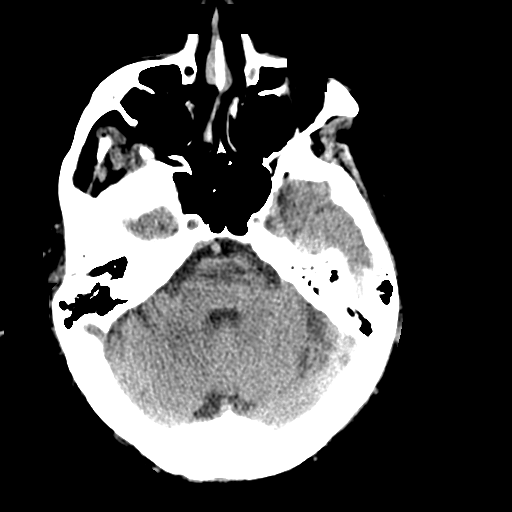
[im 11/31  brain]
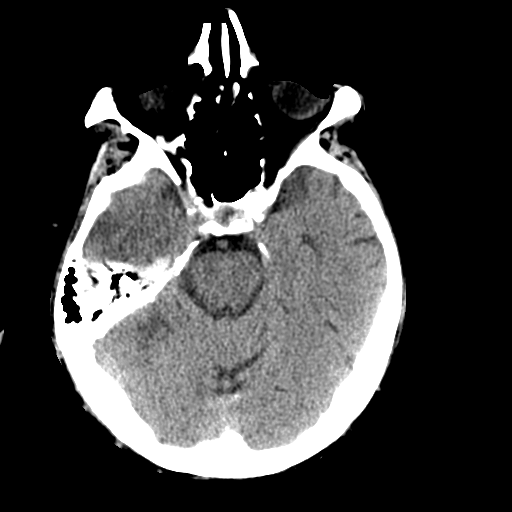
[im 14/31  brain]
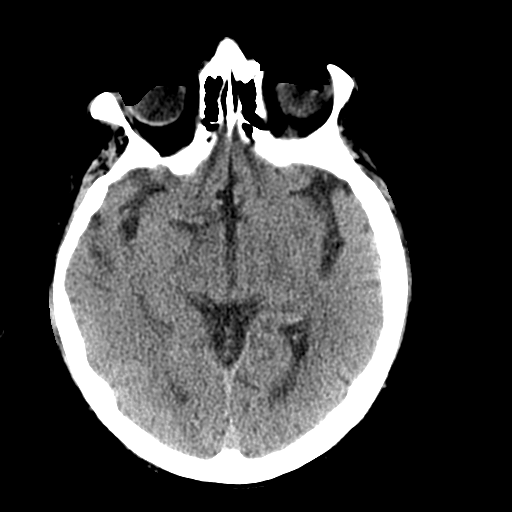
[im 14/31  bone]
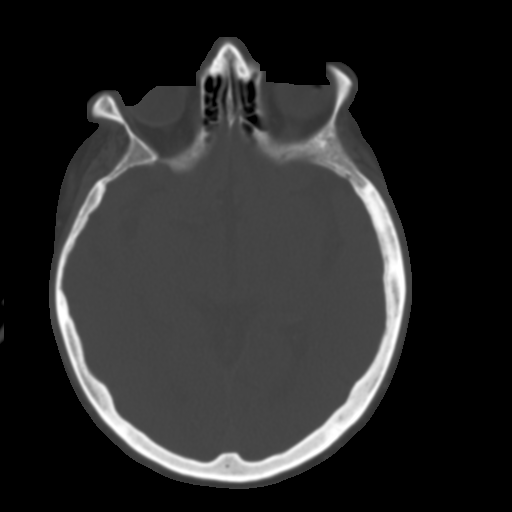
[im 17/31  brain]
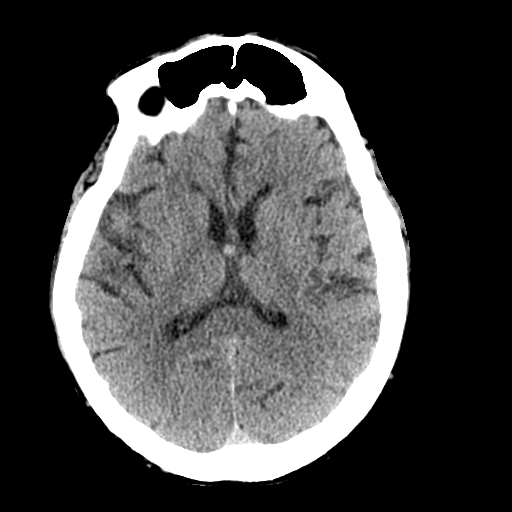
[im 20/31  brain]
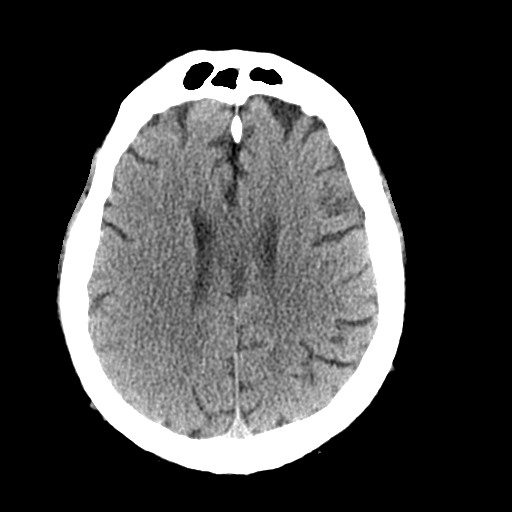
[im 23/31  brain]
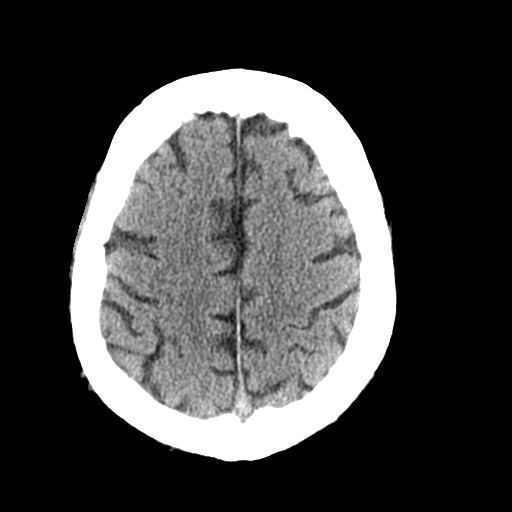
[im 25/31  brain]
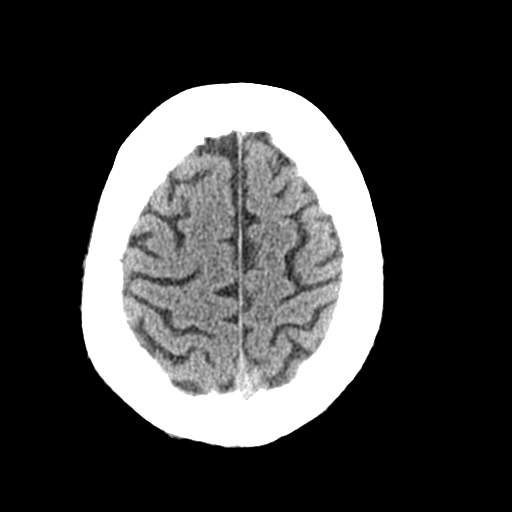
[im 25/31  bone]
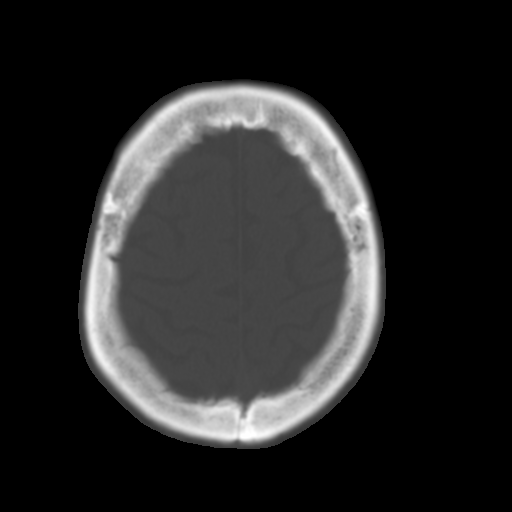
[im 28/31  brain]
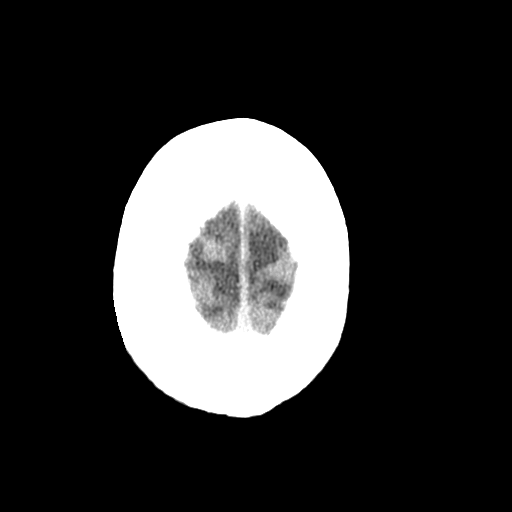

[Series 4: coronal soft · coronal · 0.35mm/px · 3 of 70 slices shown]
[im 24/70  brain]
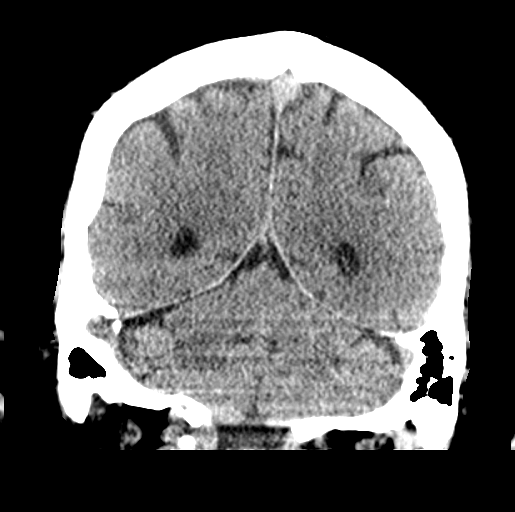
[im 31/70  brain]
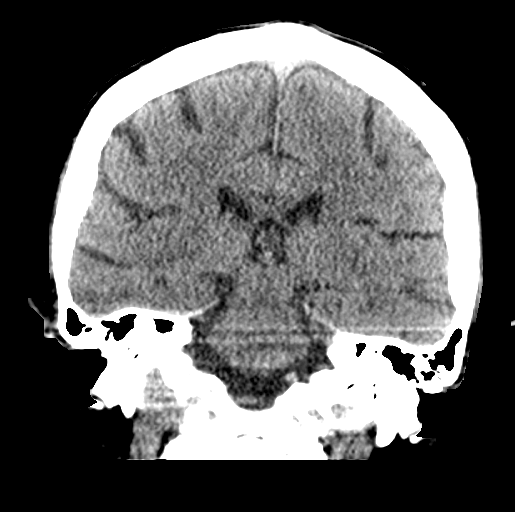
[im 39/70  brain]
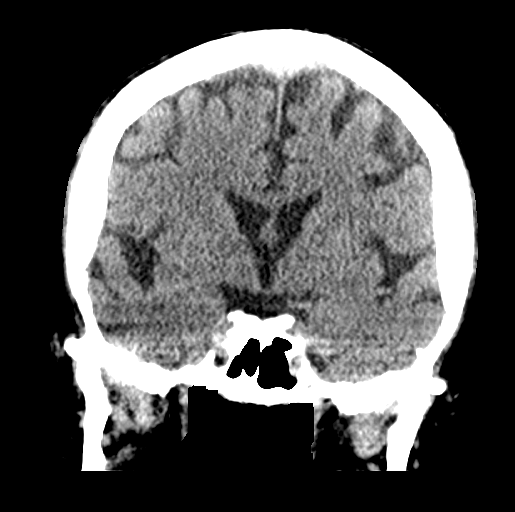

[Series 5: sagittal soft · sagittal · 0.35mm/px · 3 of 58 slices shown]
[im 20/58  brain]
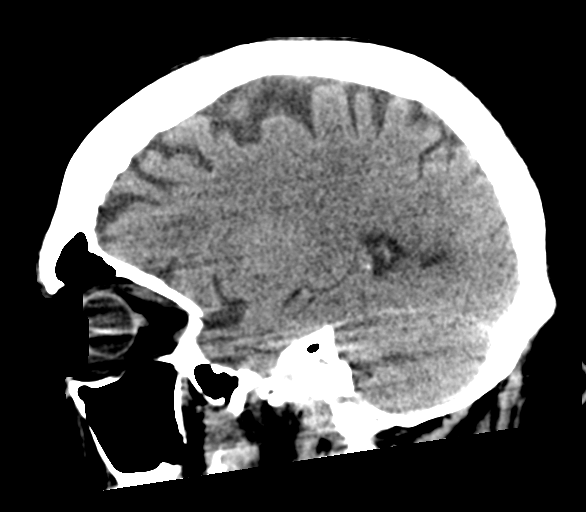
[im 29/58  brain]
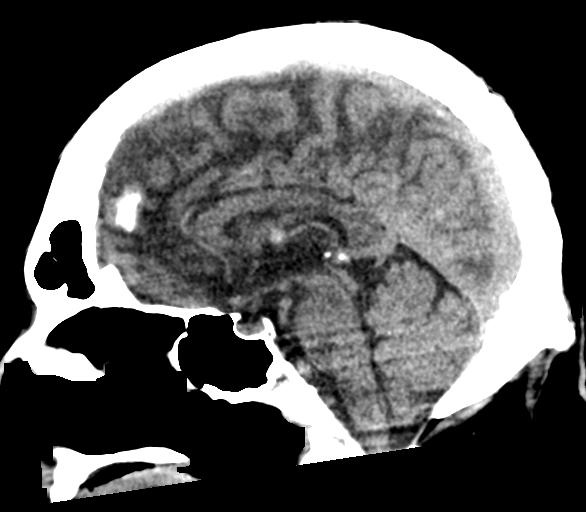
[im 39/58  brain]
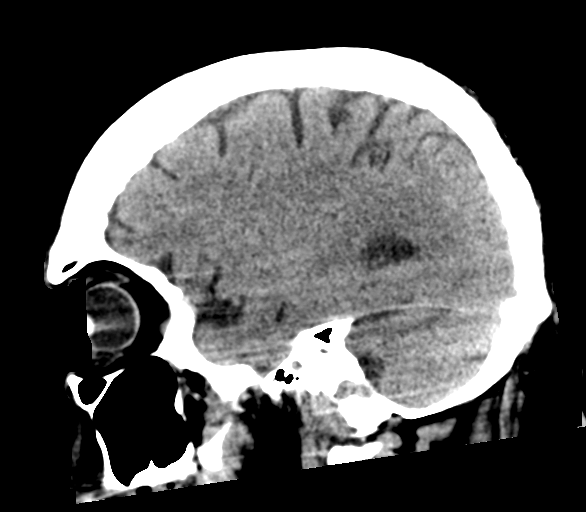

[16 of 47 positions shown; findings below may reference images not displayed]

FINDINGS: Brain:

Stable, mild generalized parenchymal atrophy.

Redemonstrated 5 mm rounded hyperdense focus within the anterior
aspect of the third ventricle consistent with colloid cyst. There is
no associated hydrocephalus.

There is no acute intracranial hemorrhage.

No demarcated cortical infarct.

No extra-axial fluid collection.

No midline shift.

Vascular: No hyperdense vessel.  Atherosclerotic calcifications.

Skull: No calvarial fracture.

Sinuses/Orbits: Visualized orbits show no acute finding. Mild
ethmoid sinus mucosal thickening. No significant mastoid effusion.
IMPRESSION: No evidence of acute intracranial abnormality.

Unchanged 5 mm colloid cyst within the anterior third ventricle. No
associated hydrocephalus.

Stable, mild generalized parenchymal atrophy.

Mild ethmoid sinus mucosal thickening.

## 2021-07-24 IMAGING — DX DG TOE GREAT 2+V*L*
3 series · 3 of 3 positions shown · non-contrast
Comparison: None.

CLINICAL DATA: Pain

EXAM:
LEFT GREAT TOE

[toe ap]
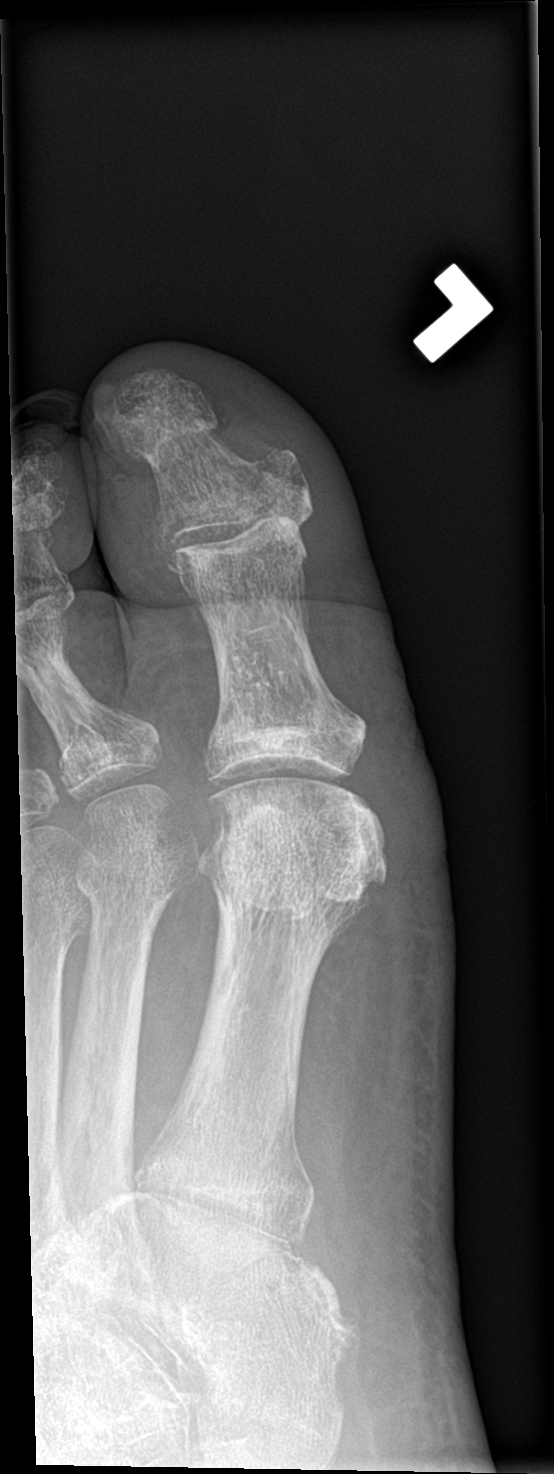

[toe obl]
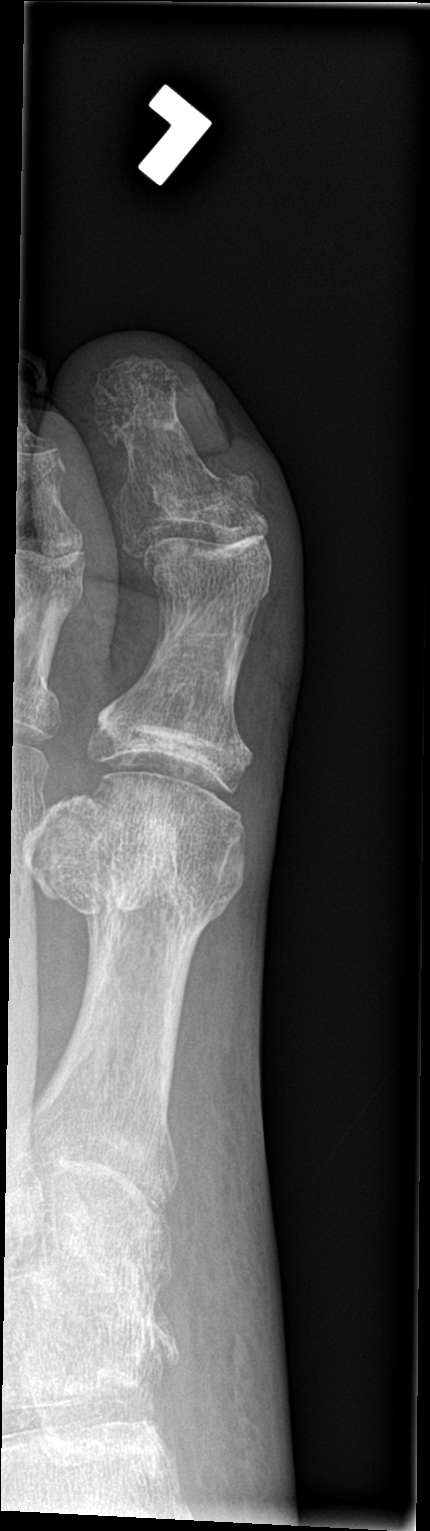

[toe lat]
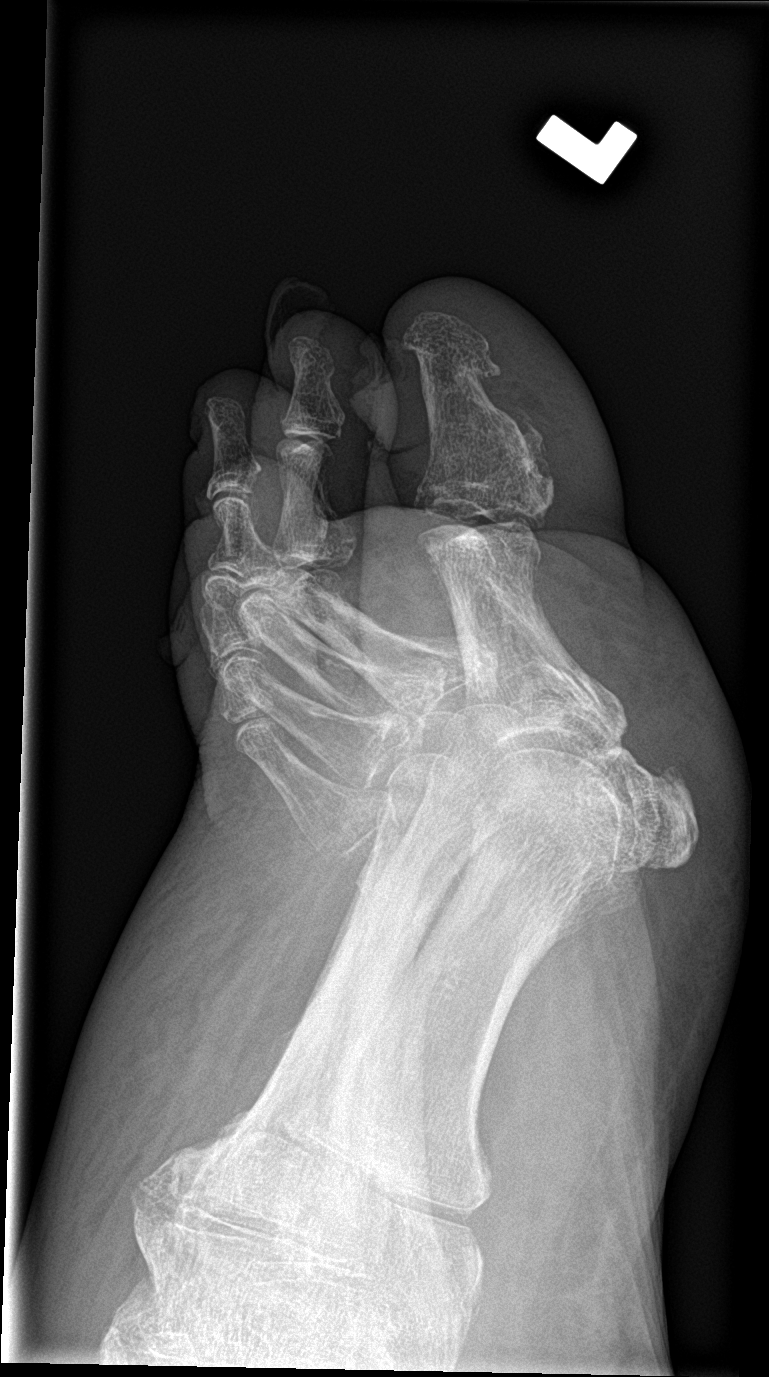

[3 of 3 positions shown; findings below may reference images not displayed]

FINDINGS: There is an acute, nondisplaced, intra-articular fracture through
the base of the distal phalanx of the first digit. There is
surrounding soft tissue swelling. There is osteopenia. There are
degenerative changes at the first metatarsophalangeal joint.
IMPRESSION: Acute nondisplaced intra-articular fracture through the base of the
distal phalanx of the first digit.

## 2021-07-24 IMAGING — DX DG FOOT COMPLETE 3+V*R*
3 series · 3 of 3 positions shown · non-contrast
Comparison: None.

CLINICAL DATA: Pain status post fall

EXAM:
RIGHT FOOT COMPLETE - 3+ VIEW

[foot ap]
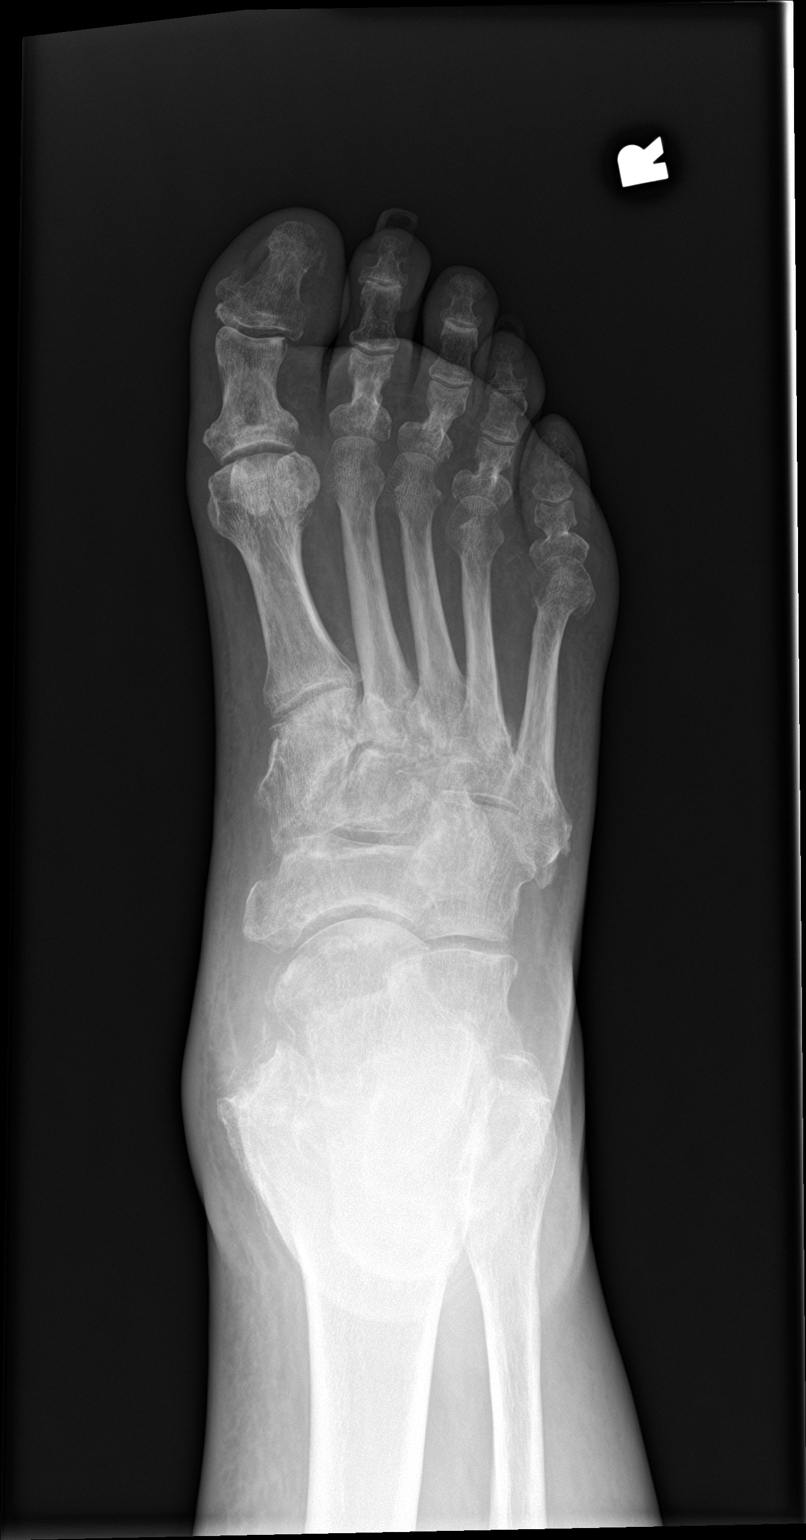

[foot obl]
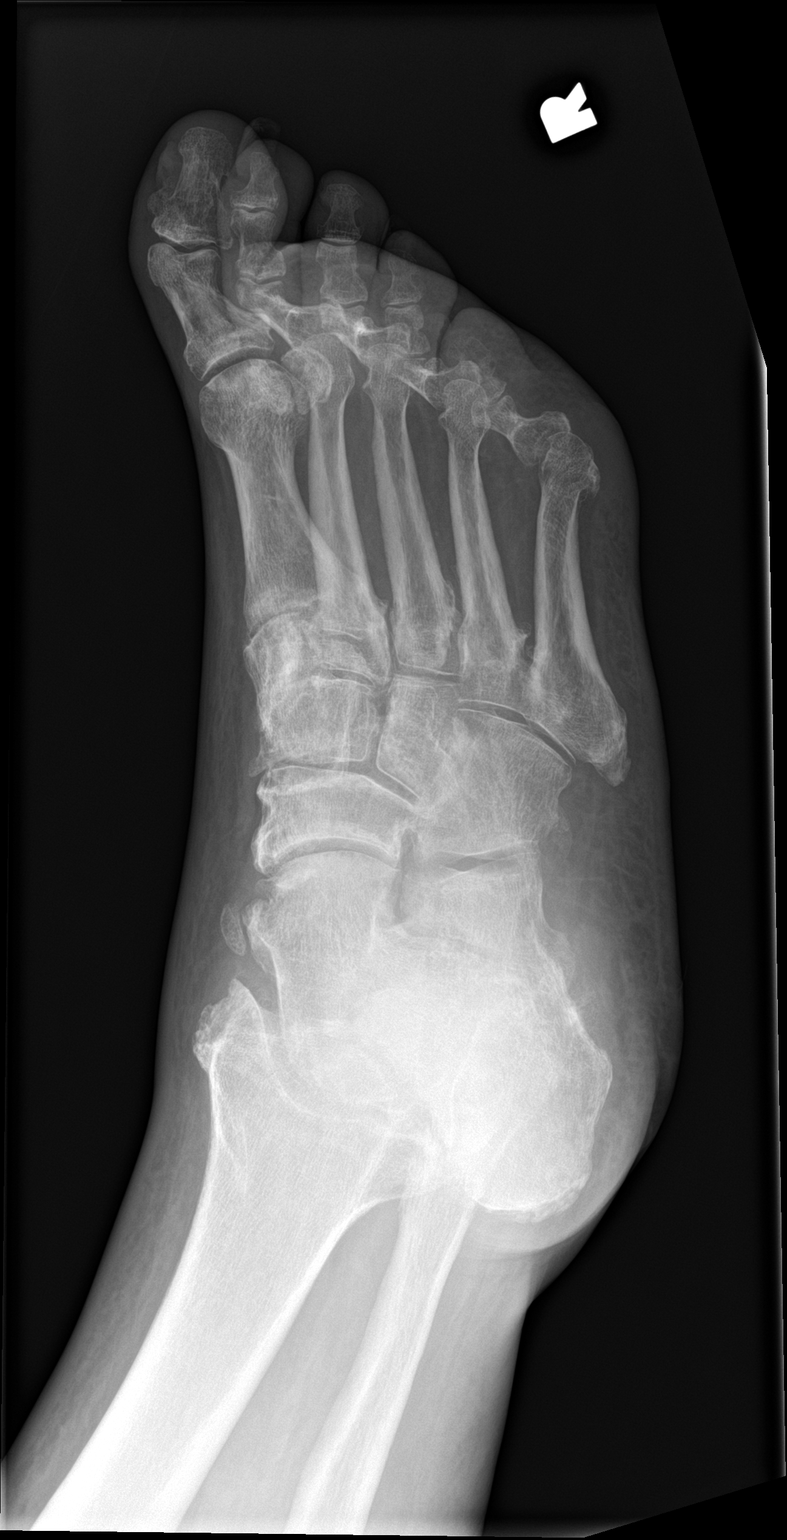

[foot lat]
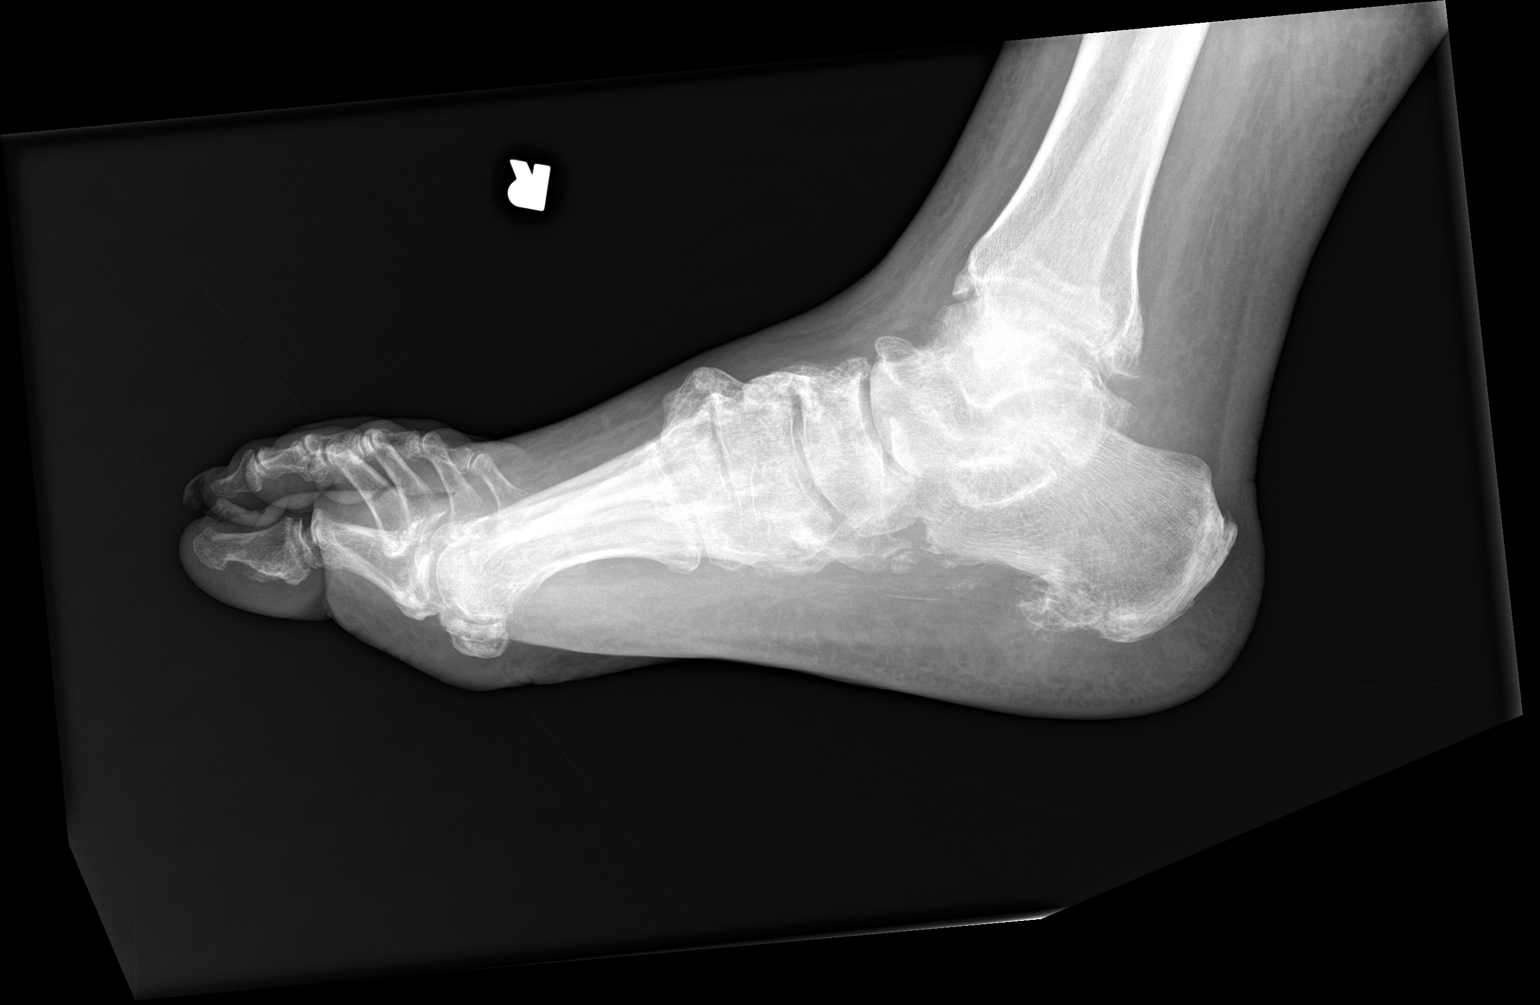

[3 of 3 positions shown; findings below may reference images not displayed]

FINDINGS: There is heterogeneous osteopenia. There are degenerative changes of
the interphalangeal joints as well as the first metatarsophalangeal
joint. There is no acute displaced fracture or dislocation. There
are degenerative changes of the midfoot. There is a large plantar
calcaneal spur. There is an Achilles tendon enthesophyte. There are
degenerative changes of the ankle mortise.
IMPRESSION: 1. No acute displaced fracture or dislocation.
2. Osteoarthritis as above.
3. Osteopenia.
4. Large plantar calcaneal spur.

## 2021-08-28 IMAGING — DX DG CHEST 1V PORT
1 series · 1 of 1 positions shown · non-contrast
Comparison: 06/17/2019 chest radiograph and prior.

CLINICAL DATA: Concern for sepsis.

EXAM:
PORTABLE CHEST 1 VIEW

[chest ap]
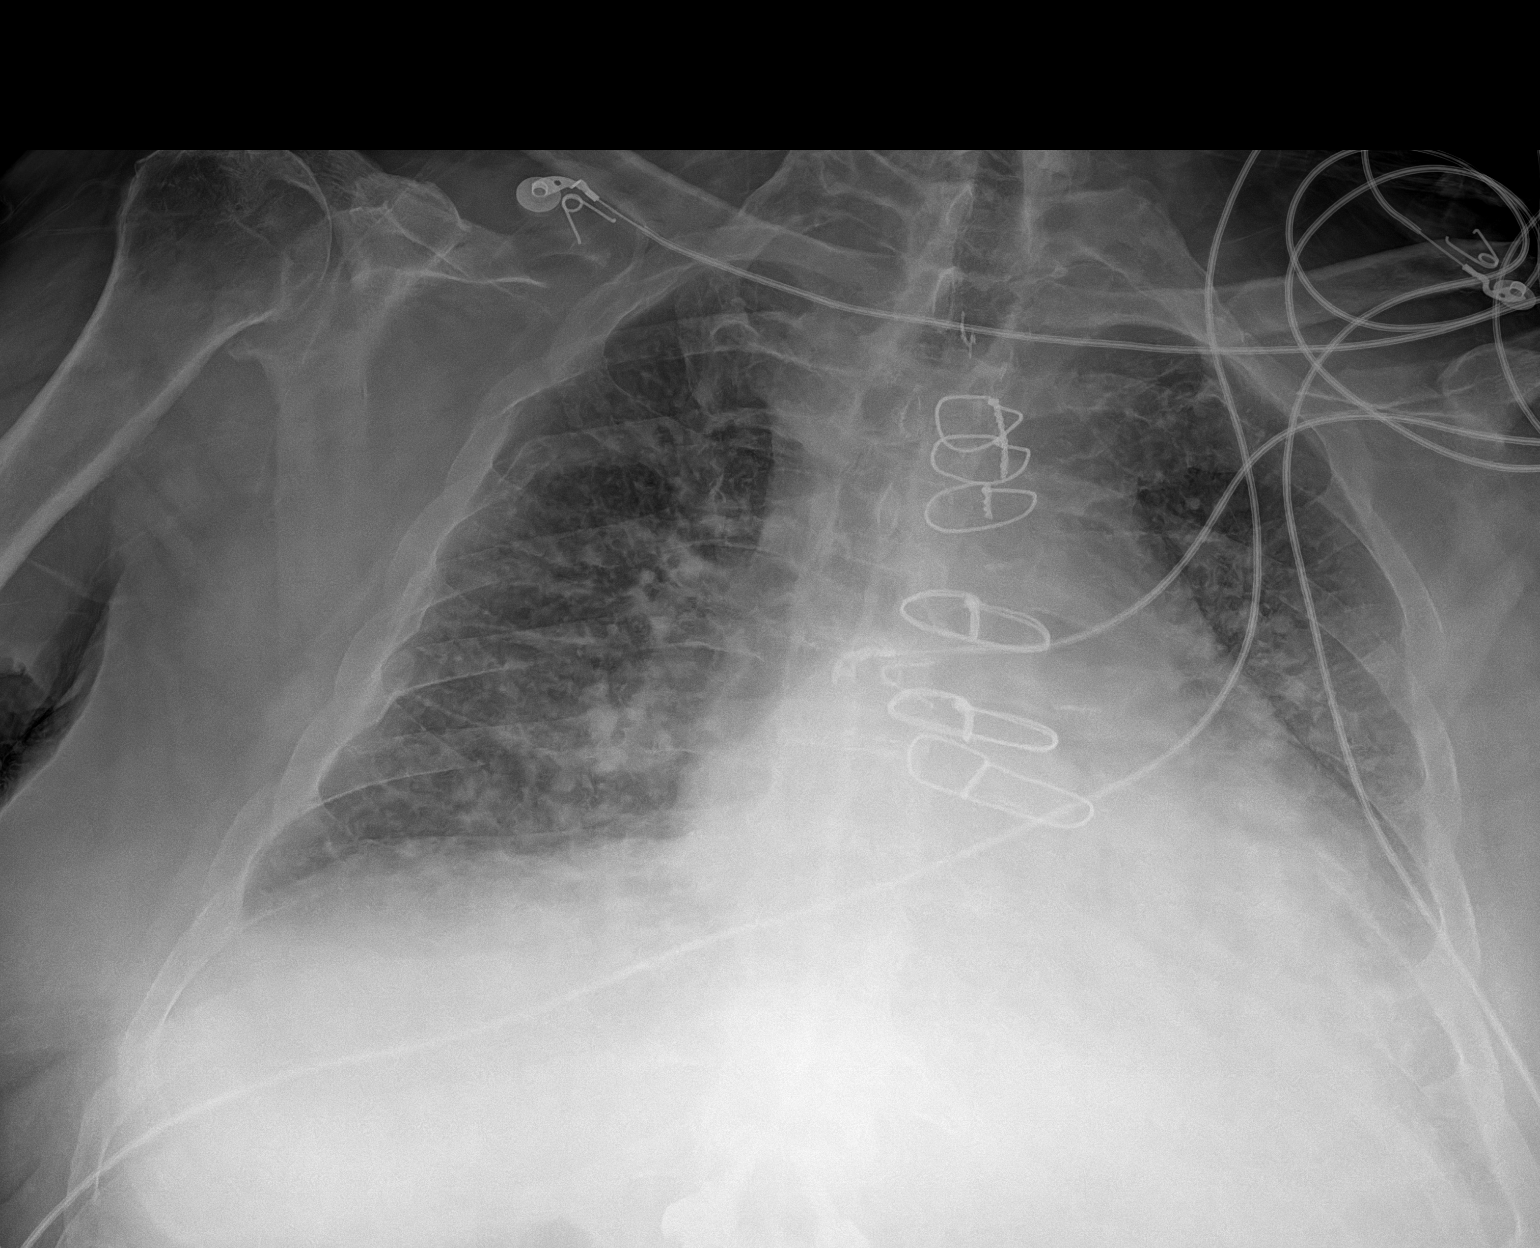

[1 of 1 positions shown; findings below may reference images not displayed]

FINDINGS: Left basilar/retrocardiac patchy opacities. No pneumothorax. Small
left pleural effusion. Cardiomegaly and prominence of the central
pulmonary vessels. Post sternotomy sequela.
IMPRESSION: Cardiomegaly and central pulmonary vascular congestion.

Left basilar opacities. Differential includes infection, edema and
atelectasis.

## 2021-08-31 IMAGING — DX DG CHEST 1V PORT
1 series · 1 of 1 positions shown · non-contrast
Comparison: Portable exam 1716 hours compared to 03/16/2020

CLINICAL DATA: Worsening dyspnea today, COPD, diabetes mellitus,
hypertension, negative [id number] test on 03/16/2020

EXAM:
PORTABLE CHEST 1 VIEW

[chest ap]
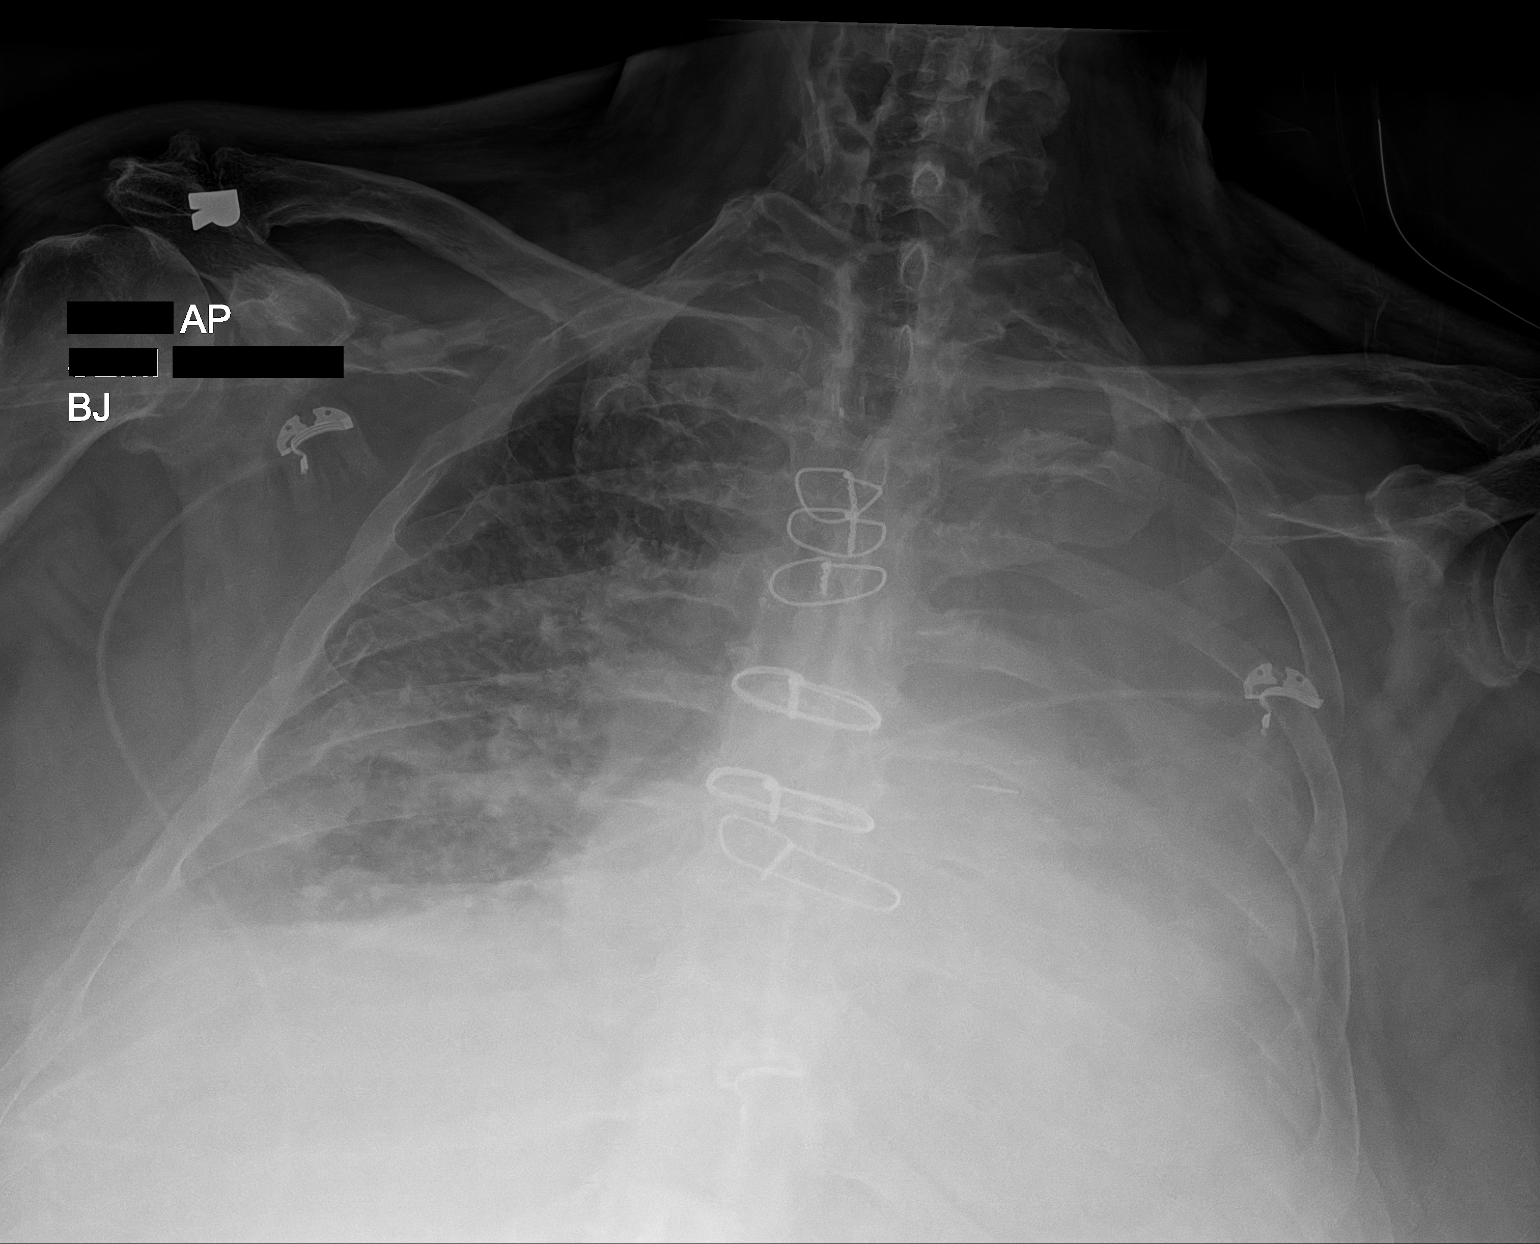

[1 of 1 positions shown; findings below may reference images not displayed]

FINDINGS: Opacification of LEFT hemithorax by pleural effusion and atelectasis
increased from prior study.

LEFT heart border obscured.

Patchy infiltrates mid to lower RIGHT lung with minimal basilar
atelectasis and probable small pleural effusion.

No pneumothorax.

Bones demineralized.
IMPRESSION: Patchy RIGHT lung infiltrates with basilar pleural effusion and
atelectasis.

Opacified LEFT hemithorax by combination of atelectasis and pleural
effusion, underlying infiltrate not excluded.

## 2021-09-01 IMAGING — DX DG CHEST 2V
2 series · 2 of 2 positions shown · non-contrast
Comparison: March 19, 2020.

CLINICAL DATA: Shortness of breath.

EXAM:
CHEST - 2 VIEW

[chest lat]
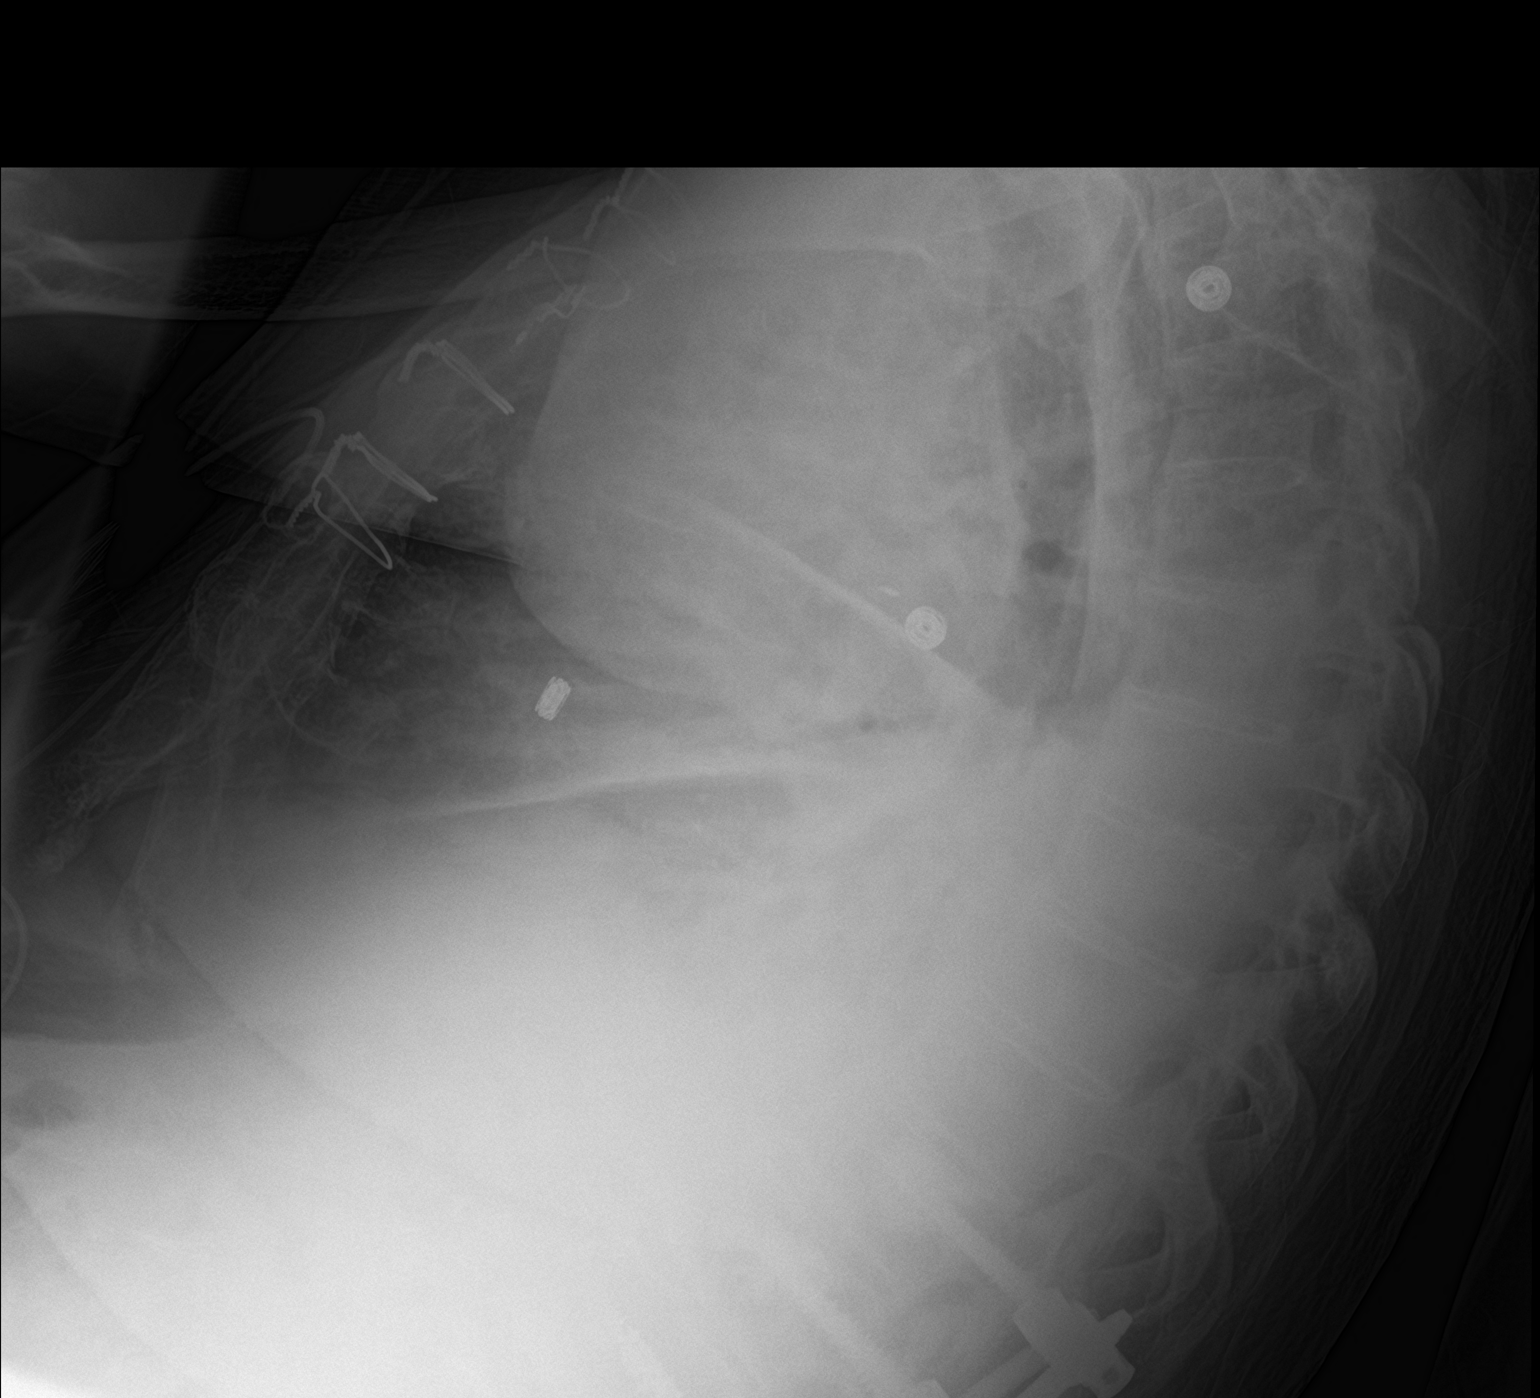

[chest ap]
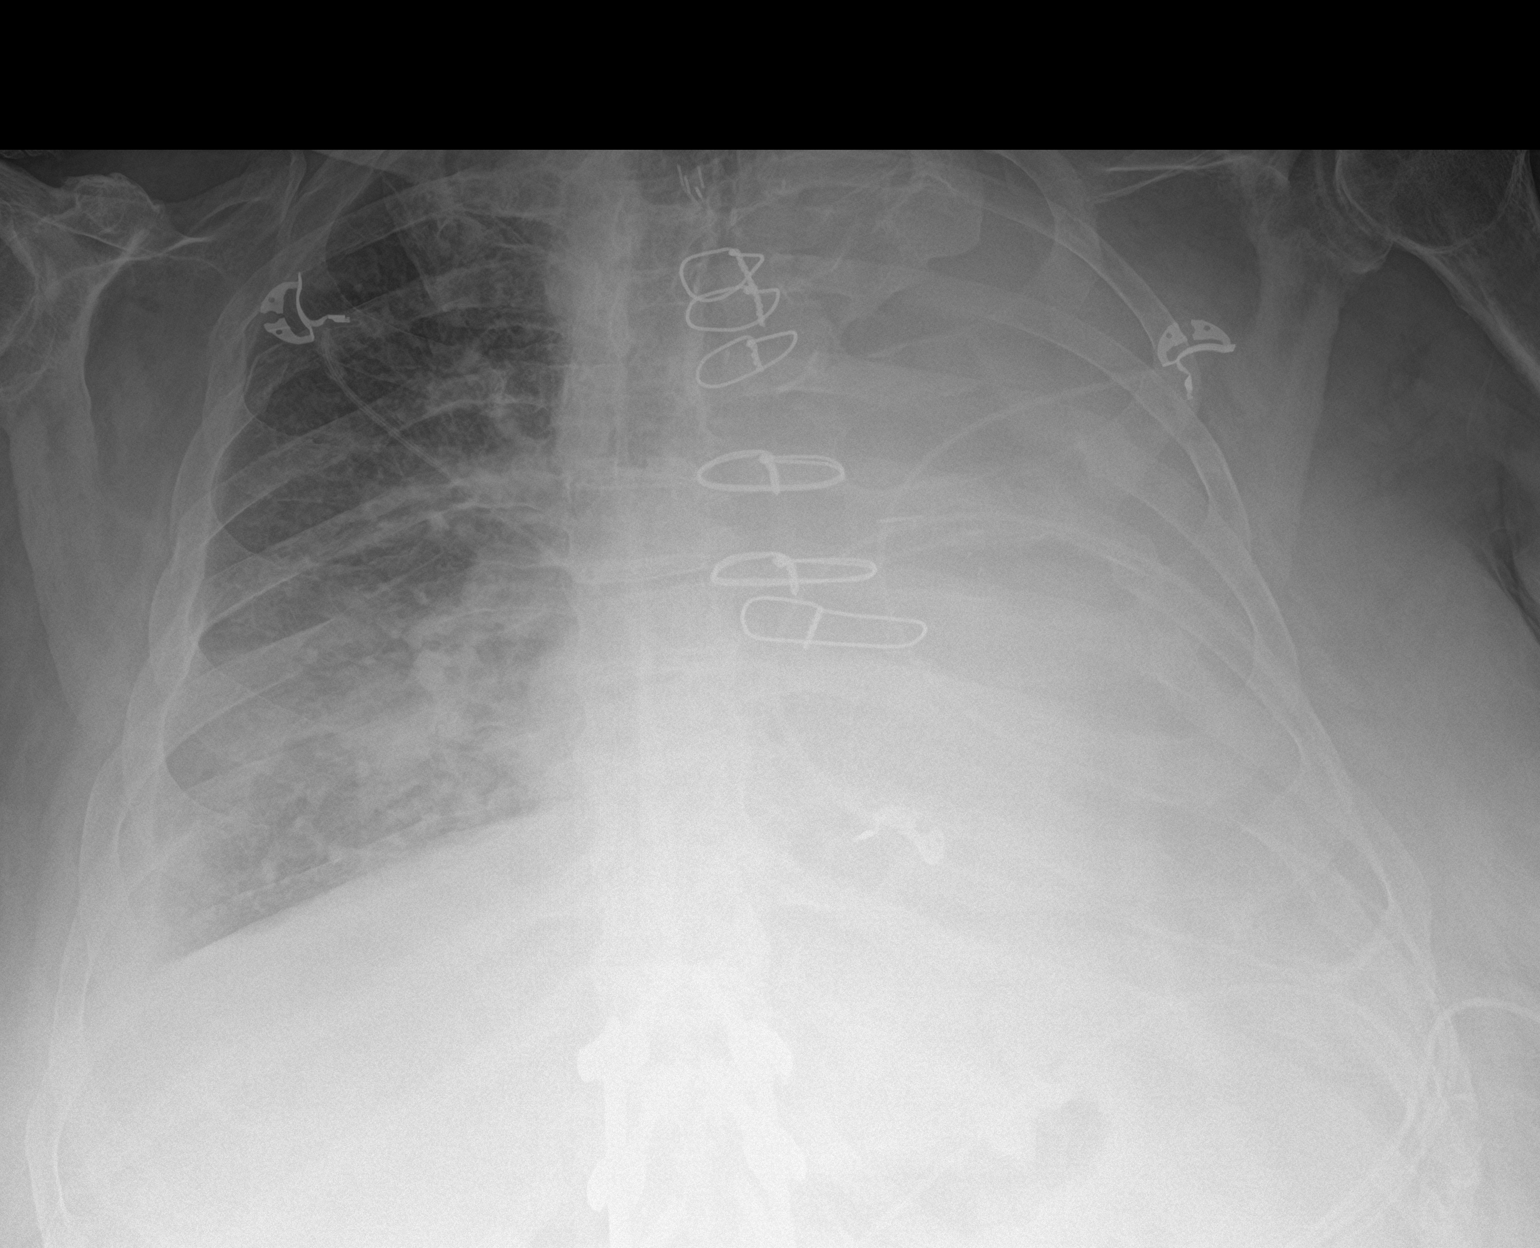

[2 of 2 positions shown; findings below may reference images not displayed]

FINDINGS: There is stable complete opacification of left hemithorax most
likely due to a combination of effusion and atelectasis. Sternotomy
wires are noted. Stable right basilar opacity is noted concerning
for edema or atelectasis. No pneumothorax is noted.
IMPRESSION: Stable complete opacification of left hemithorax most likely due to
combination of effusion and atelectasis. Stable right basilar
opacity concerning for edema or atelectasis.

## 2021-09-01 IMAGING — US US CHEST/MEDIASTINUM
1 series · 3 of 3 positions shown · non-contrast
Comparison: Chest radiograph 03/20/2020

CLINICAL DATA: Dyspnea.

EXAM:
CHEST ULTRASOUND - LIMITED

[Series 1: us thoracentesis asp pleural space w/img guide · 3 of 3 slices shown]
[im 1/3]
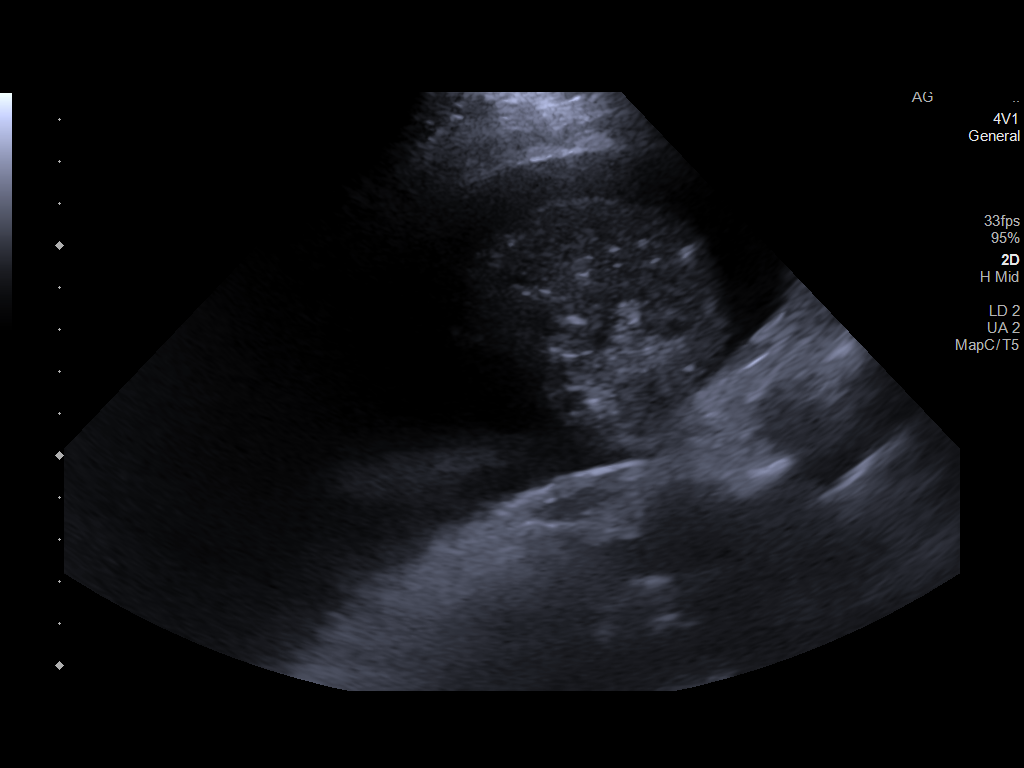
[im 2/3]
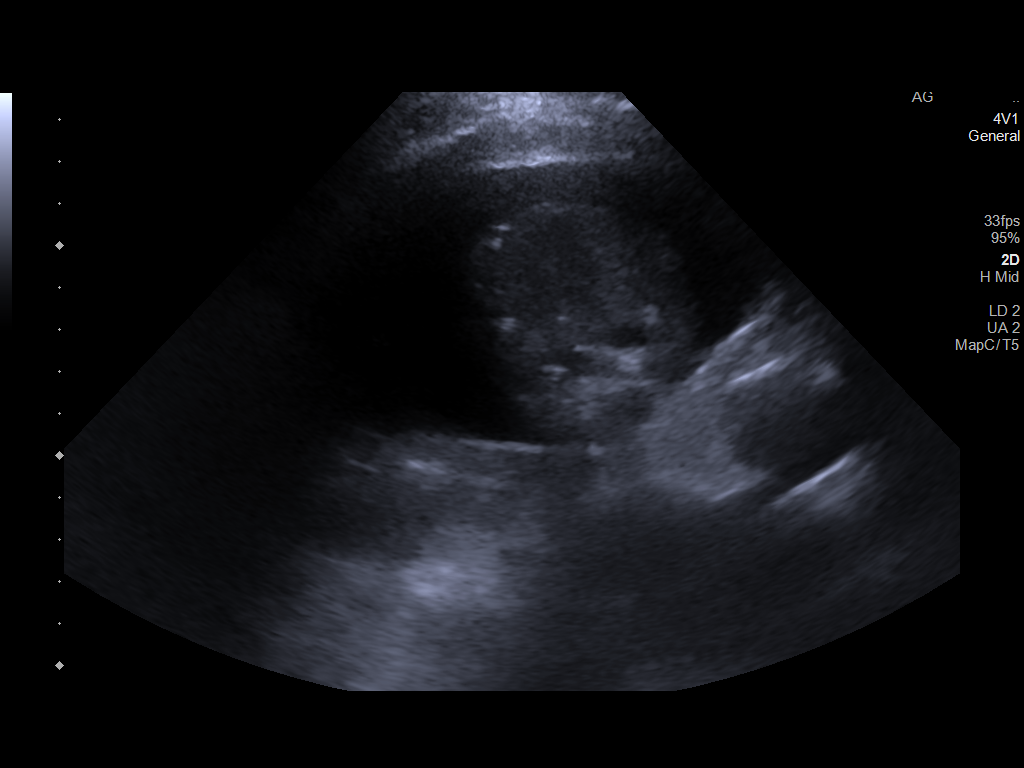
[im 3/3]
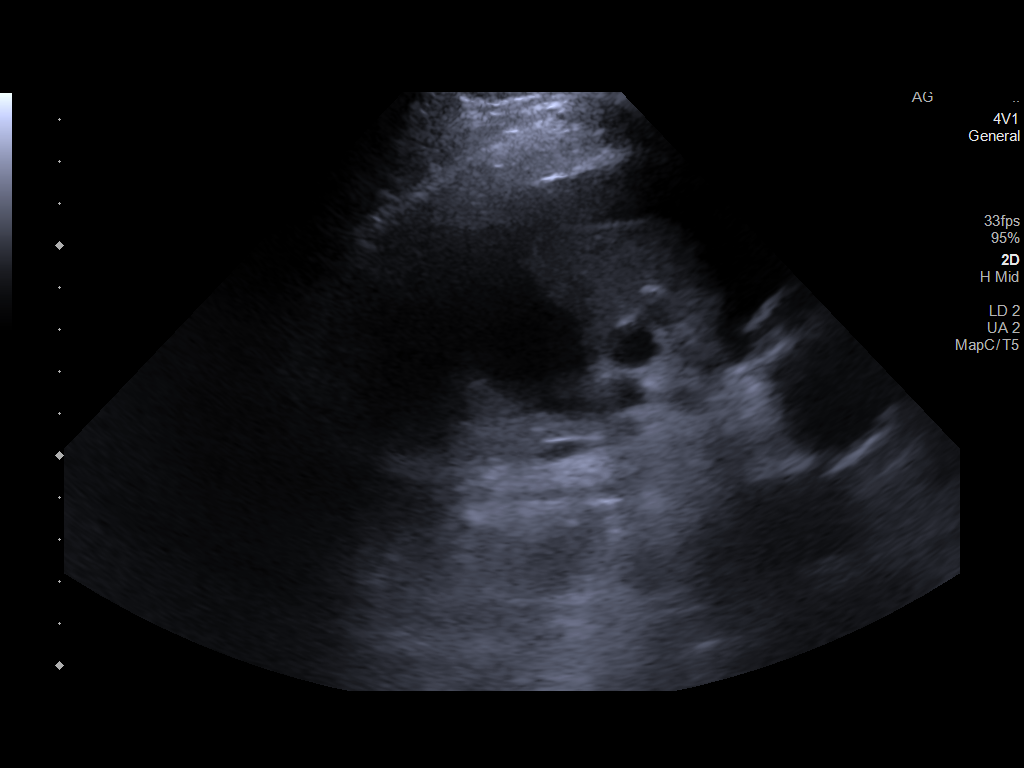

[3 of 3 positions shown; findings below may reference images not displayed]

FINDINGS: Left chest sonography demonstrates a moderate left pleural effusion.

We were unable to sit the patient up to perform thoracentesis. The
patient was very stiff and even with several caregivers holding the
patient, we were not able to hold him in a steady position at the
site of the bed adequate to perform thoracentesis. There is too
great of a risk of him falling or sliding during thoracentesis due
to his rigidity in the hips and legs.
IMPRESSION: 1. At least moderate left pleural effusion is visualized
sonographically. Thoracentesis was not attempted as the patient
could not be positioned adequately.

## 2021-09-02 IMAGING — DX DG CHEST 1V PORT
2 series · 2 of 2 positions shown · non-contrast
Comparison: March 21, 2020 study obtained earlier in the day

CLINICAL DATA: Hypoxia

EXAM:
PORTABLE CHEST 1 VIEW

[chest ap grid (1 of 2)]
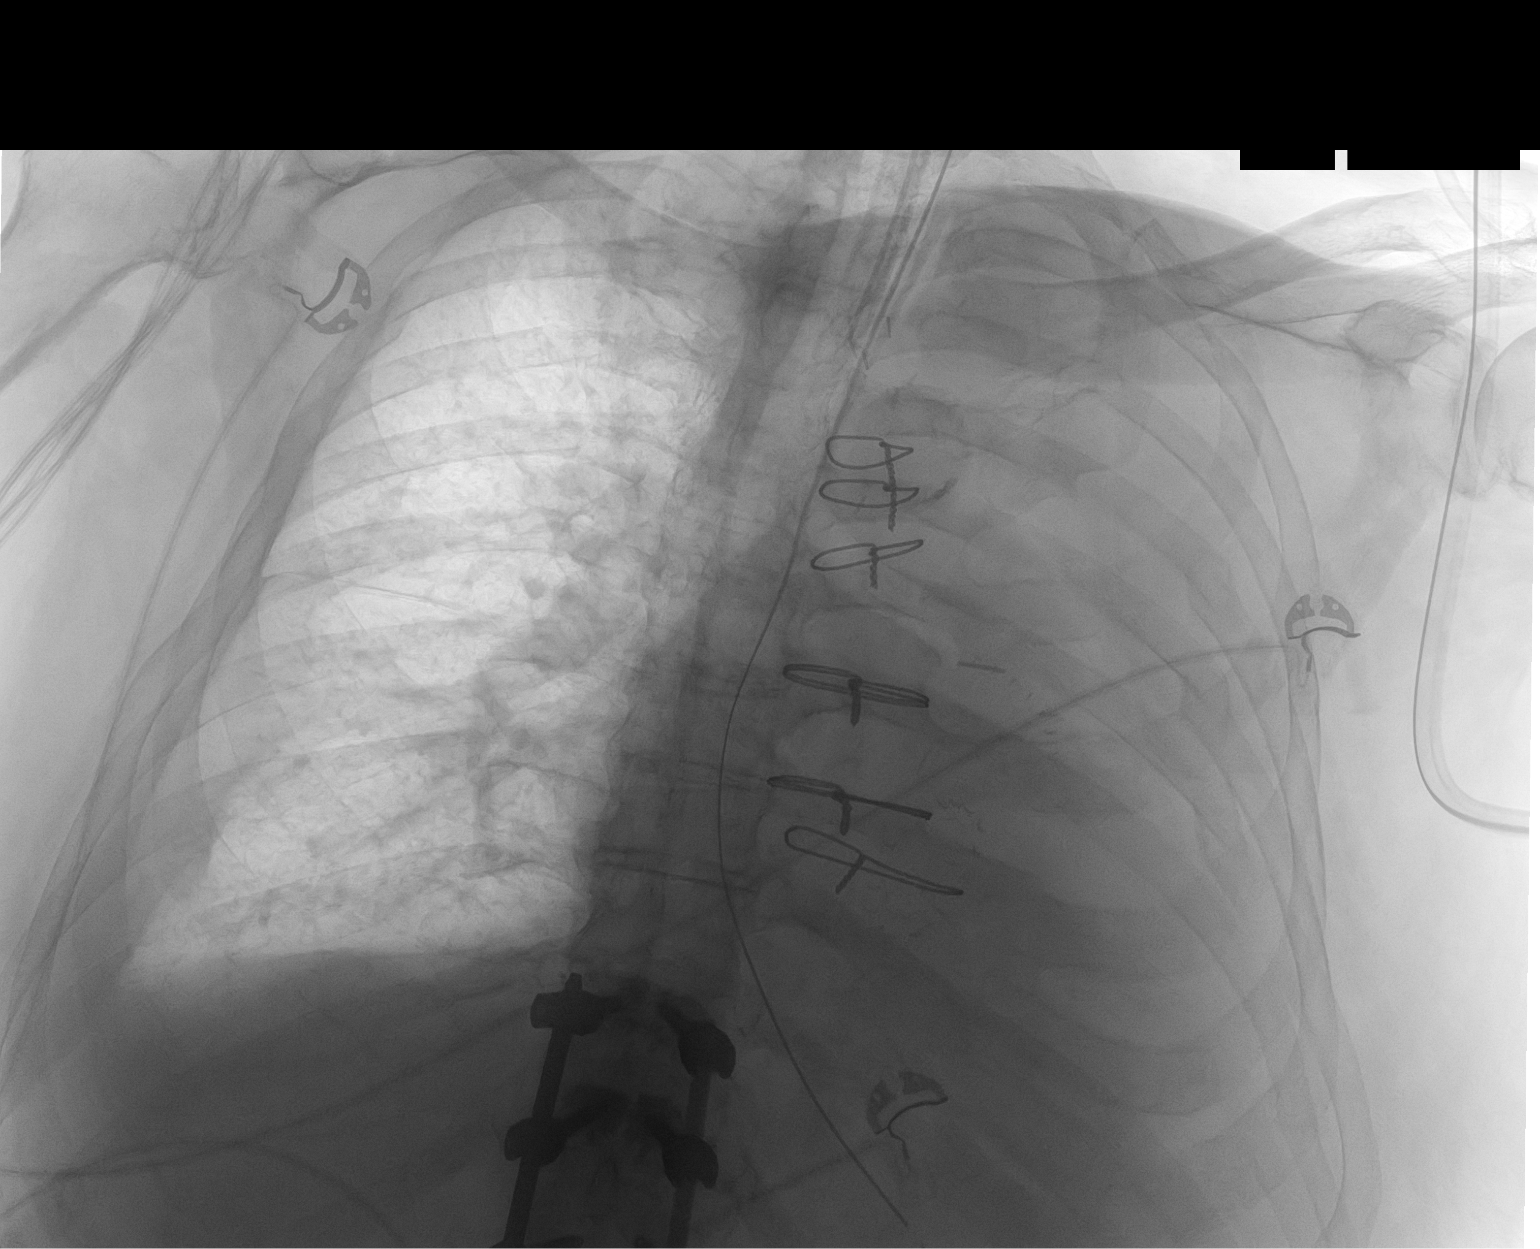

[chest ap grid (2 of 2)]
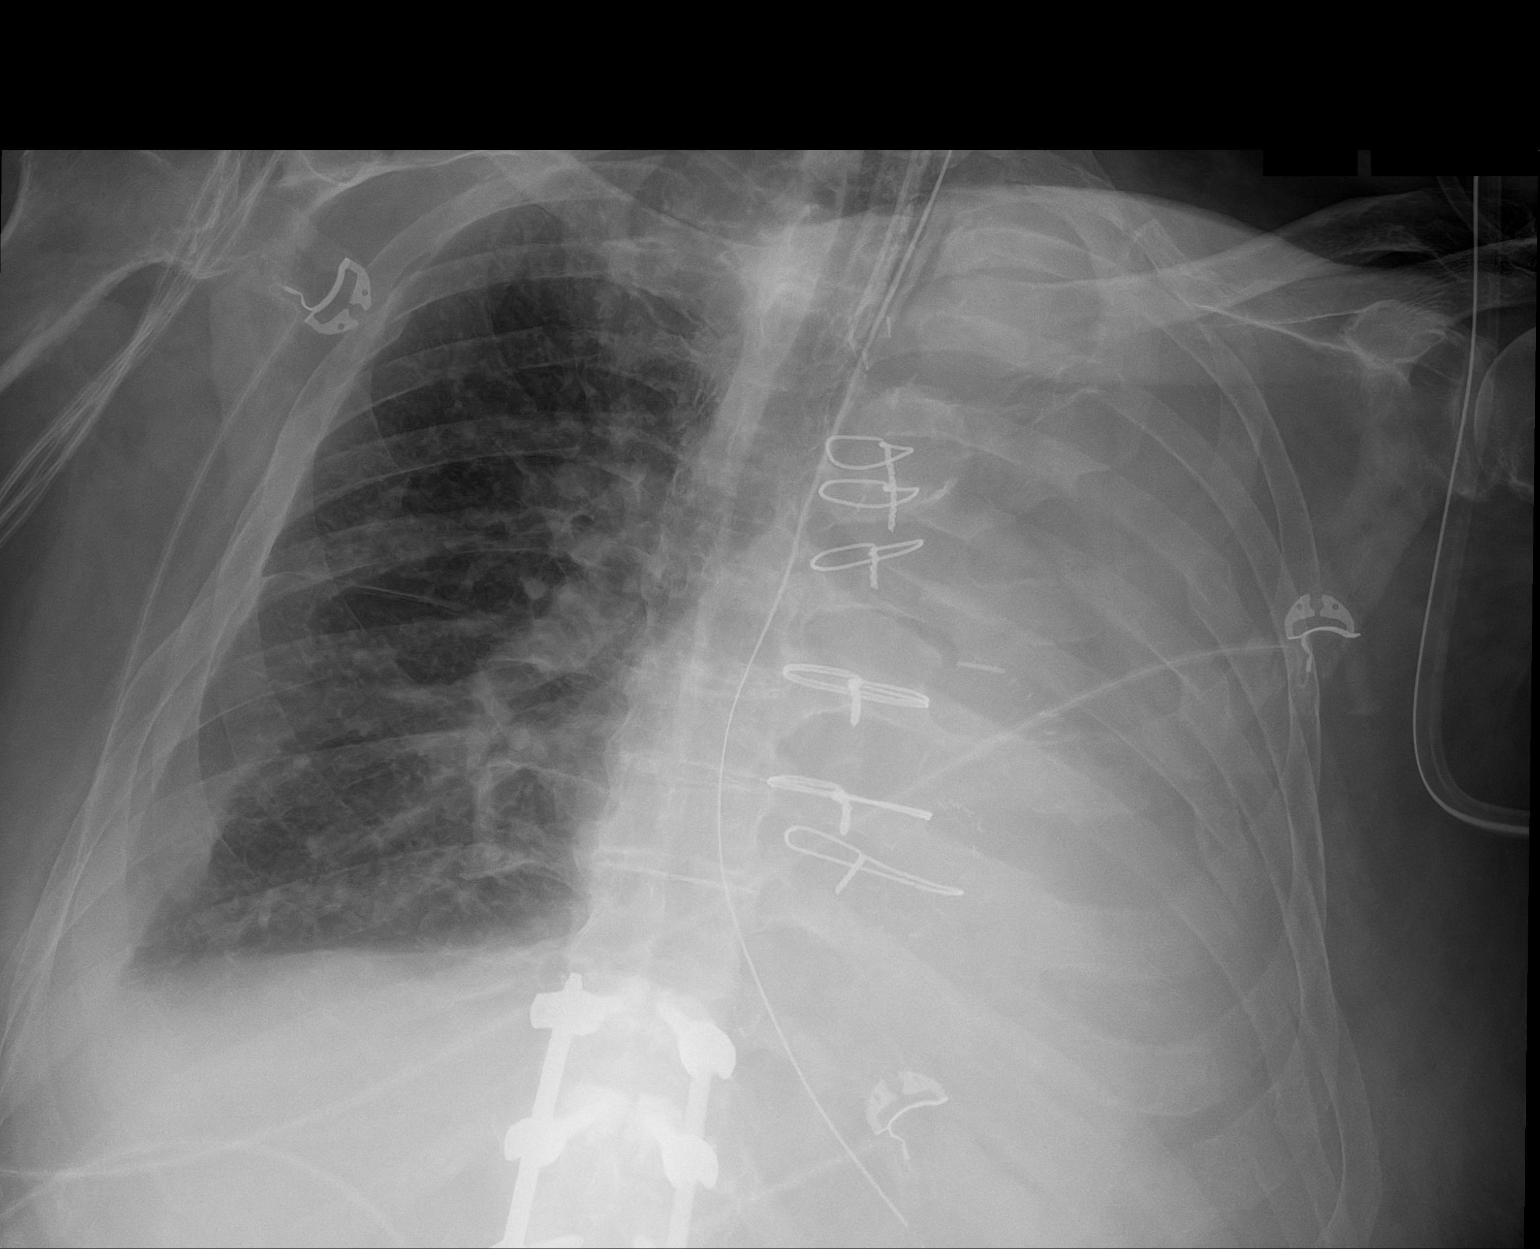

[2 of 2 positions shown; findings below may reference images not displayed]

FINDINGS: Endotracheal tube tip is 4.5 cm above the carina. Nasogastric tube
tip is in the proximal stomach with the side port near the
gastroesophageal junction, not well seen. No pneumothorax. There is
extensive pleural effusion on the left with essentially complete
opacification of the left hemithorax. There may well be underlying
atelectasis and infiltrate on the left. On the right, there is
apparent loculated effusion in the lateral right base. Right lung
otherwise clear. Heart is prominent, stable, with evidence of
previous median sternotomy. There is aortic atherosclerosis.
Pulmonary vascularity on the right appears unremarkable. Pulmonary
vascularity on the left is obscured fluid. No adenopathy evident in
areas that can be assessed for potential adenopathy. Postoperative
change noted in the lower thoracic and lumbar regions.
IMPRESSION: Tube and catheter positions as described without pneumothorax. It
may be prudent to consider advancing endotracheal tube 4-5 cm to
insure that both tube tip and side port are well within the stomach.

Extensive opacification on the left with essentially complete
opacification of the left hemithorax, likely due to combination of
fluid and atelectasis. Superimposed pneumonia on the left cannot be
excluded.

Probable loculated pleural effusion lateral right base. Right lung
otherwise clear.

Stable cardiac silhouette. Aortic Atherosclerosis ([id number]-[id number].I).

## 2021-09-02 IMAGING — CT CT CHEST W/O CM
2 of 4 series · 9 of 36 positions shown, 11 images · non-contrast
Comparison: Chest CT 05/19/2018
COMPARISON: Chest CT 05/19/2018

Addendum:
CLINICAL DATA: Shortness of breath, history of CHF, COPD.
Thoracentesis 03/21/2019

EXAM:
CT CHEST WITHOUT CONTRAST
TECHNIQUE: Multidetector CT imaging of the chest was performed following the
standard protocol without IV contrast.

[Series 2: routine chest without · axial · non-contrast · 0.85mm/px · z∈[+1362,+1632]mm · 6 of 176 slices shown, 8 images]
[im 27/176  mediastinal]
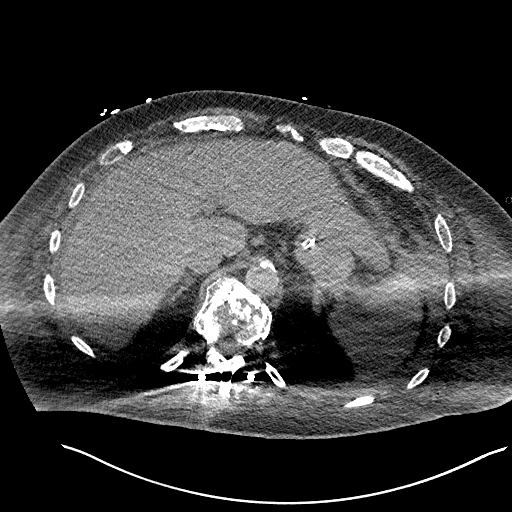
[im 27/176  lung]
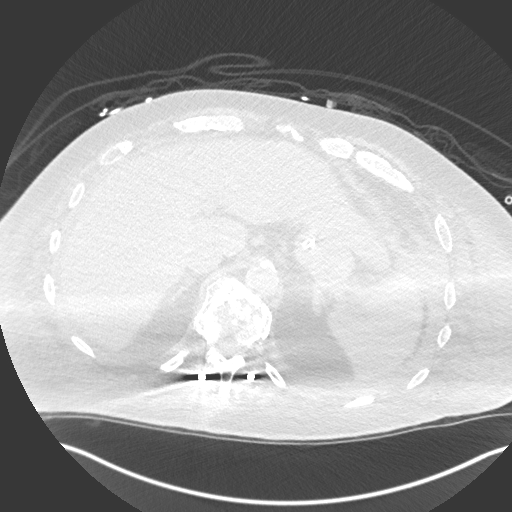
[im 54/176  lung]
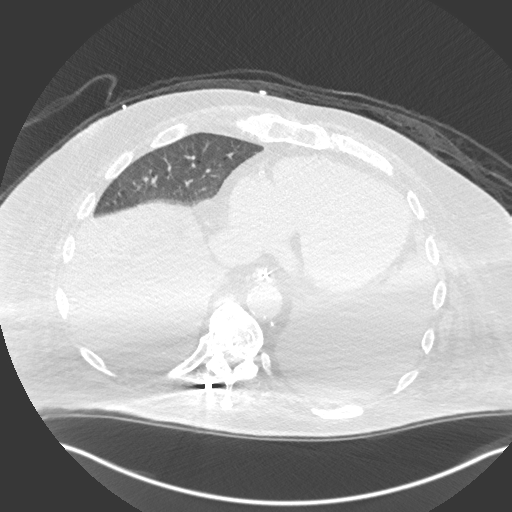
[im 81/176  lung]
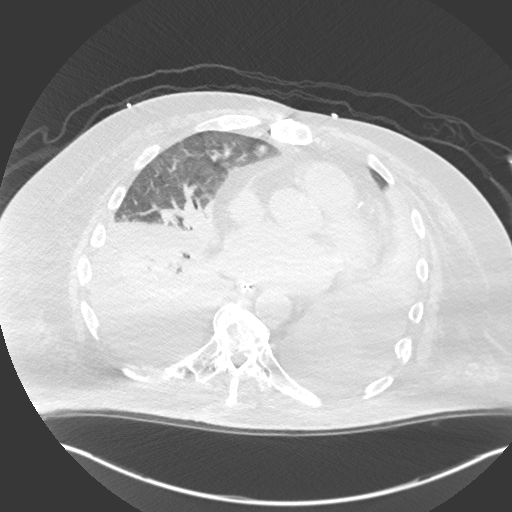
[im 108/176  lung]
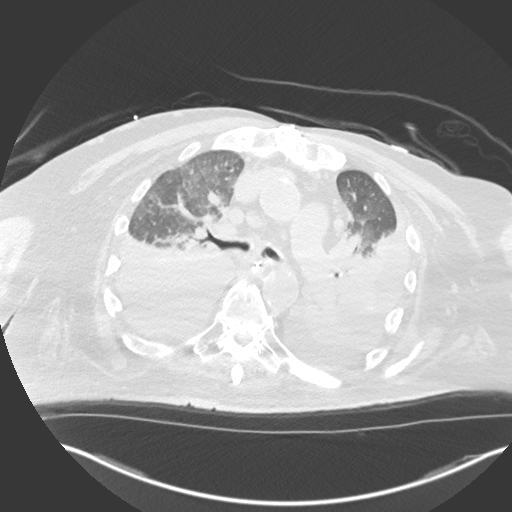
[im 135/176  mediastinal]
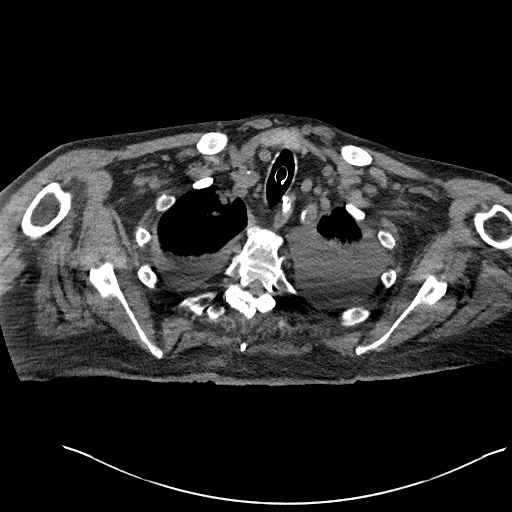
[im 135/176  lung]
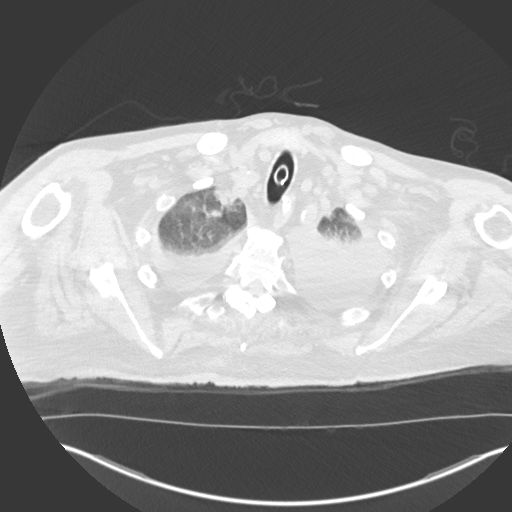
[im 162/176  lung]
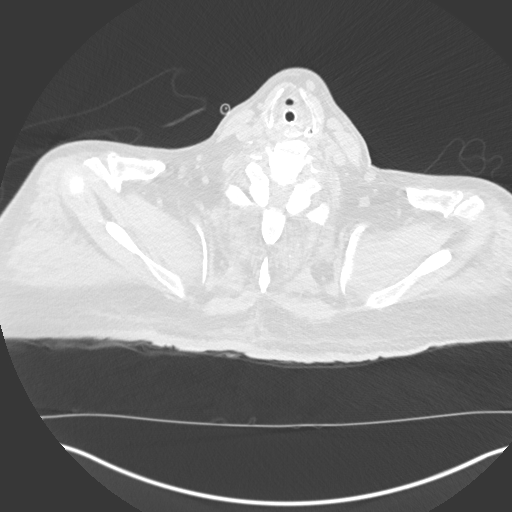

[Series 5: coronal · coronal · 0.71mm/px · 3 of 151 slices shown]
[im 31/151  lung]
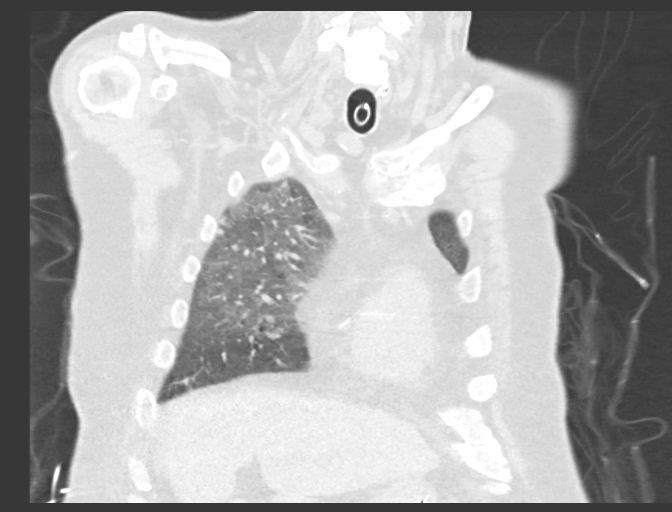
[im 61/151  lung]
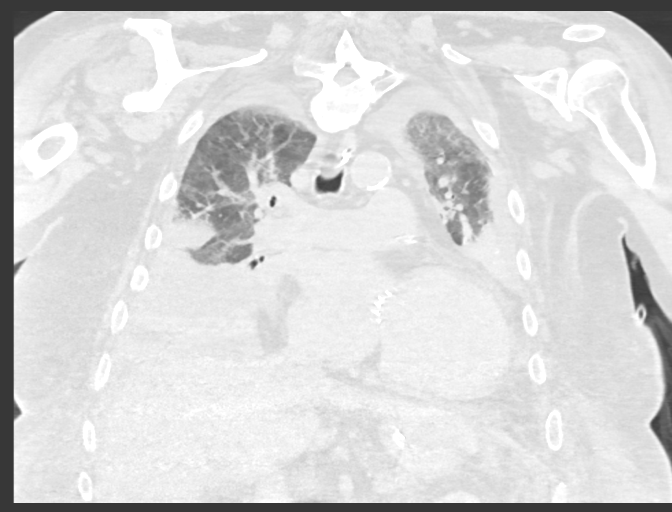
[im 91/151  lung]
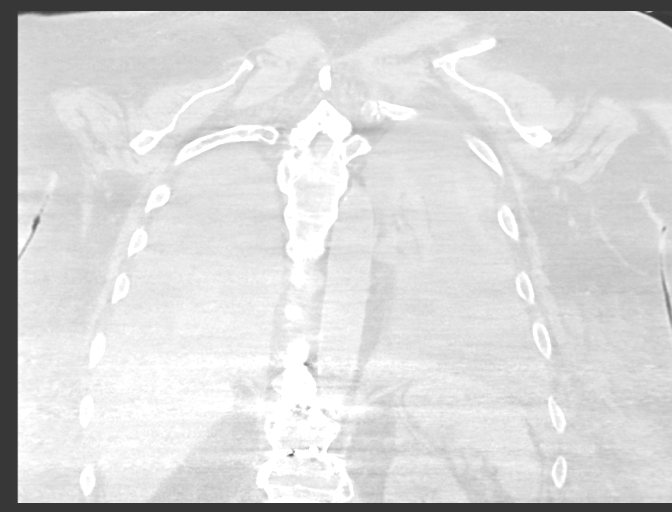

[9 of 36 positions shown; findings below may reference images not displayed]

FINDINGS: Imaging quality is severely degraded due to patient body habitus
resulting in extensive photon starvation which also exacerbates the
streak artifact from patient's hardware

Cardiovascular: Cardiomegaly with four-chamber cardiac enlargement.
Dense calcification of mitral annulus. Three-vessel coronary artery
atherosclerosis. Focal pericardial thickening towards the apical
septum (4/99). Atherosclerotic plaque within the normal caliber
aorta. No periaortic stranding or hemorrhage. Normal 3 vessel
branching of the aortic arch. Proximal great vessels are calcified
but otherwise of grossly unremarkable. Central pulmonary artery
enlargement, nonspecific but similar to prior and possibly
reflecting some chronic pulmonary hypertension. Luminal evaluation
precluded in the absence of contrast media. Pulmonary vascularity
within the lung parenchyma is cephalized and redistributed. Luminal
evaluation of the vasculature precluded in the absence of contrast.

Mediastinum/Nodes: Mild edematous changes in the mediastinum. Few
enlarged and borderline enlarged low-attenuation mediastinal nodes
are present including a 14 mm subcarinal node (2/71). No axillary
adenopathy. Hilar nodal evaluation is severely limited in the
absence intravenous contrast media. Postsurgical changes in the
anterior mediastinum likely related prior sternotomy and CABG.
Endotracheal tube terminates low within the trachea, 1.7 cm from the
carina. Consider retracting at least 2 cm to the mid trachea.
Transesophageal tube is in place. Tip terminates below the level of
imaging with but with the side port appropriately positioned distal
to the GE junction. Thyroid gland and thoracic inlet are
unremarkable.

Lungs/Pleura: Moderate to large bilateral pleural effusions, right
greater than left with essentially complete atelectatic collapse of
both lower lobes, subsegmental collapse of the right middle lobe and
lingula as well. Underlying consolidation is not excluded
particularly given areas of mixed consolidation and ground-glass
within the aerated portions of the upper lungs. Some mild
interlobular septal thickening is noted as well. No pneumothorax.

Upper Abdomen: Small amount of subdiaphragmatic ascites.
Questionable stranding centered upon the pancreatic tail. Upper
abdominal atherosclerosis.

Musculoskeletal: Appearance of extensive bridging syndesmophytes as
well as fusion across the spinous processes with some exaggerated
thoracic kyphosis. Could correlate for features ankylosing
spondylitis. No acute fracture or suspicious osseous lesions. Prior
sternotomy as well. Lower thoracolumbar fusion hardware is noted
including vertebral augmentation changes at the T11 and 12 levels.
Additional degenerative changes in both shoulders. Moderate body
wall edema.
IMPRESSION: 1. Imaging quality is severely degraded due to patient body habitus
resulting in extensive photon starvation which also exacerbates the
streak artifact from patient's hardware.
2. Moderate to large bilateral pleural effusions, right greater than
left, with essentially complete atelectatic collapse of both lower
lobes, subsegmental collapse of the right middle lobe and lingula as
well. Underlying consolidation in the regions of volume loss is not
excluded with evidence of additional areas of consolidation and
ground-glass within the aerated portions of the upper lungs
concerning for an acute infectious/inflammatory process including
atypical viral etiologies.
3. Additional features throughout the chest suggesting CHF/volume
overload with pulmonary edema, cardiomegaly, ascites and body wall
edema.
4. Dense mitral annular calcifications, could consider nonemergent
echocardiography for evaluation of valvulae op a 3.
5. Focal pericardial thickening towards the apical septum.
Nonspecific appearance. Could correlate for features of pericarditis
or ischemic change.
6. Central pulmonary artery enlargement, nonspecific and
incompletely evaluated in the absence of contrast media but similar
to prior and possibly reflecting some chronic pulmonary
hypertension.
7. Small amount of subdiaphragmatic ascites.
8. Questionable stranding centered upon the pancreatic tail.
Possibly redistributed ascites though should correlate with lipase
to exclude pancreatitis.
9. Appearance of extensive bridging syndesmophytes as well as fusion
across the spinous processes with some exaggerated thoracic
kyphosis. Such imaging features can be seen in the setting of
ankylosing spondylitis.
10. Aortic Atherosclerosis (5E63A-LA0.0).

ADDENDUM:
Transcription error:

Impression point #4 should read:

Dense mitral annular calcifications, could consider nonemergent
echocardiography for evaluation of valve/cardiac function.

*** End of Addendum ***
FINDINGS: Imaging quality is severely degraded due to patient body habitus
resulting in extensive photon starvation which also exacerbates the
streak artifact from patient's hardware

Cardiovascular: Cardiomegaly with four-chamber cardiac enlargement.
Dense calcification of mitral annulus. Three-vessel coronary artery
atherosclerosis. Focal pericardial thickening towards the apical
septum (4/99). Atherosclerotic plaque within the normal caliber
aorta. No periaortic stranding or hemorrhage. Normal 3 vessel
branching of the aortic arch. Proximal great vessels are calcified
but otherwise of grossly unremarkable. Central pulmonary artery
enlargement, nonspecific but similar to prior and possibly
reflecting some chronic pulmonary hypertension. Luminal evaluation
precluded in the absence of contrast media. Pulmonary vascularity
within the lung parenchyma is cephalized and redistributed. Luminal
evaluation of the vasculature precluded in the absence of contrast.

Mediastinum/Nodes: Mild edematous changes in the mediastinum. Few
enlarged and borderline enlarged low-attenuation mediastinal nodes
are present including a 14 mm subcarinal node (2/71). No axillary
adenopathy. Hilar nodal evaluation is severely limited in the
absence intravenous contrast media. Postsurgical changes in the
anterior mediastinum likely related prior sternotomy and CABG.
Endotracheal tube terminates low within the trachea, 1.7 cm from the
carina. Consider retracting at least 2 cm to the mid trachea.
Transesophageal tube is in place. Tip terminates below the level of
imaging with but with the side port appropriately positioned distal
to the GE junction. Thyroid gland and thoracic inlet are
unremarkable.

Lungs/Pleura: Moderate to large bilateral pleural effusions, right
greater than left with essentially complete atelectatic collapse of
both lower lobes, subsegmental collapse of the right middle lobe and
lingula as well. Underlying consolidation is not excluded
particularly given areas of mixed consolidation and ground-glass
within the aerated portions of the upper lungs. Some mild
interlobular septal thickening is noted as well. No pneumothorax.

Upper Abdomen: Small amount of subdiaphragmatic ascites.
Questionable stranding centered upon the pancreatic tail. Upper
abdominal atherosclerosis.

Musculoskeletal: Appearance of extensive bridging syndesmophytes as
well as fusion across the spinous processes with some exaggerated
thoracic kyphosis. Could correlate for features ankylosing
spondylitis. No acute fracture or suspicious osseous lesions. Prior
sternotomy as well. Lower thoracolumbar fusion hardware is noted
including vertebral augmentation changes at the T11 and 12 levels.
Additional degenerative changes in both shoulders. Moderate body
wall edema.
IMPRESSION: 1. Imaging quality is severely degraded due to patient body habitus
resulting in extensive photon starvation which also exacerbates the
streak artifact from patient's hardware.
2. Moderate to large bilateral pleural effusions, right greater than
left, with essentially complete atelectatic collapse of both lower
lobes, subsegmental collapse of the right middle lobe and lingula as
well. Underlying consolidation in the regions of volume loss is not
excluded with evidence of additional areas of consolidation and
ground-glass within the aerated portions of the upper lungs
concerning for an acute infectious/inflammatory process including
atypical viral etiologies.
3. Additional features throughout the chest suggesting CHF/volume
overload with pulmonary edema, cardiomegaly, ascites and body wall
edema.
4. Dense mitral annular calcifications, could consider nonemergent
echocardiography for evaluation of valvulae op a 3.
5. Focal pericardial thickening towards the apical septum.
Nonspecific appearance. Could correlate for features of pericarditis
or ischemic change.
6. Central pulmonary artery enlargement, nonspecific and
incompletely evaluated in the absence of contrast media but similar
to prior and possibly reflecting some chronic pulmonary
hypertension.
7. Small amount of subdiaphragmatic ascites.
8. Questionable stranding centered upon the pancreatic tail.
Possibly redistributed ascites though should correlate with lipase
to exclude pancreatitis.
9. Appearance of extensive bridging syndesmophytes as well as fusion
across the spinous processes with some exaggerated thoracic
kyphosis. Such imaging features can be seen in the setting of
ankylosing spondylitis.
10. Aortic Atherosclerosis (5E63A-LA0.0).

## 2021-09-02 IMAGING — DX DG CHEST 1V PORT
1 series · 1 of 1 positions shown · non-contrast
Comparison: Portable exam 1293 hours compared to 03/20/2020

CLINICAL DATA: Shortness of breath, history CHF, COPD, atrial
fibrillation, obesity, was [id number] negative on 03/16/2020, post
thoracentesis 03/20/2020

EXAM:
PORTABLE CHEST 1 VIEW

[chest ap]
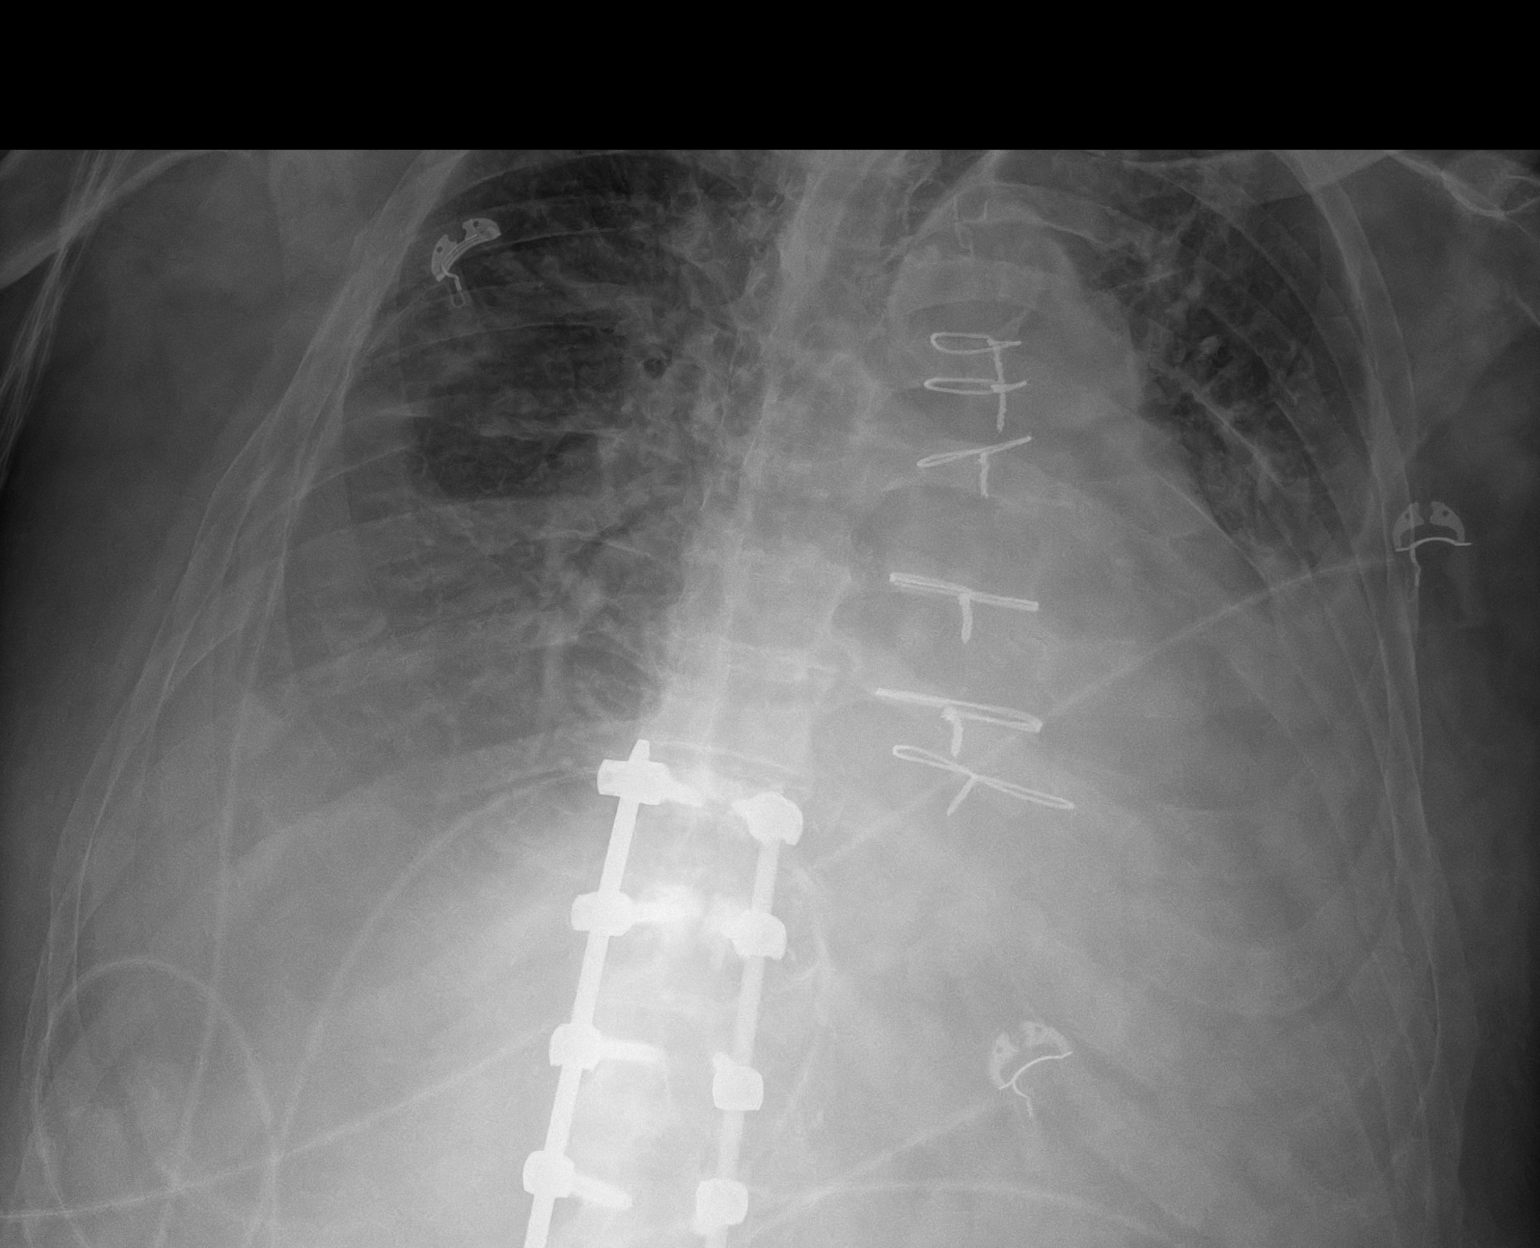

[1 of 1 positions shown; findings below may reference images not displayed]

FINDINGS: Rotated to the LEFT.

Upper normal size of cardiac silhouette post median sternotomy.

Pulmonary vascular congestion.

Bibasilar effusions and atelectasis, significantly decreased on LEFT
since previous exam.

Minimal pulmonary edema question.

No pneumothorax.

Bones demineralized with prior thoracolumbar fusion.
IMPRESSION: Bibasilar pleural effusions and atelectasis, decreased on LEFT since
prior exam.

Question minimal pulmonary edema.

## 2021-09-04 IMAGING — DX DG CHEST 1V PORT
1 series · 2 of 2 positions shown · non-contrast
Comparison: 03/21/2020

CLINICAL DATA: Respiratory failure.

EXAM:
PORTABLE CHEST 1 VIEW

[Series 2: chest ap grid · 0.14mm/px · 2 of 2 slices shown]
[im 1/2]
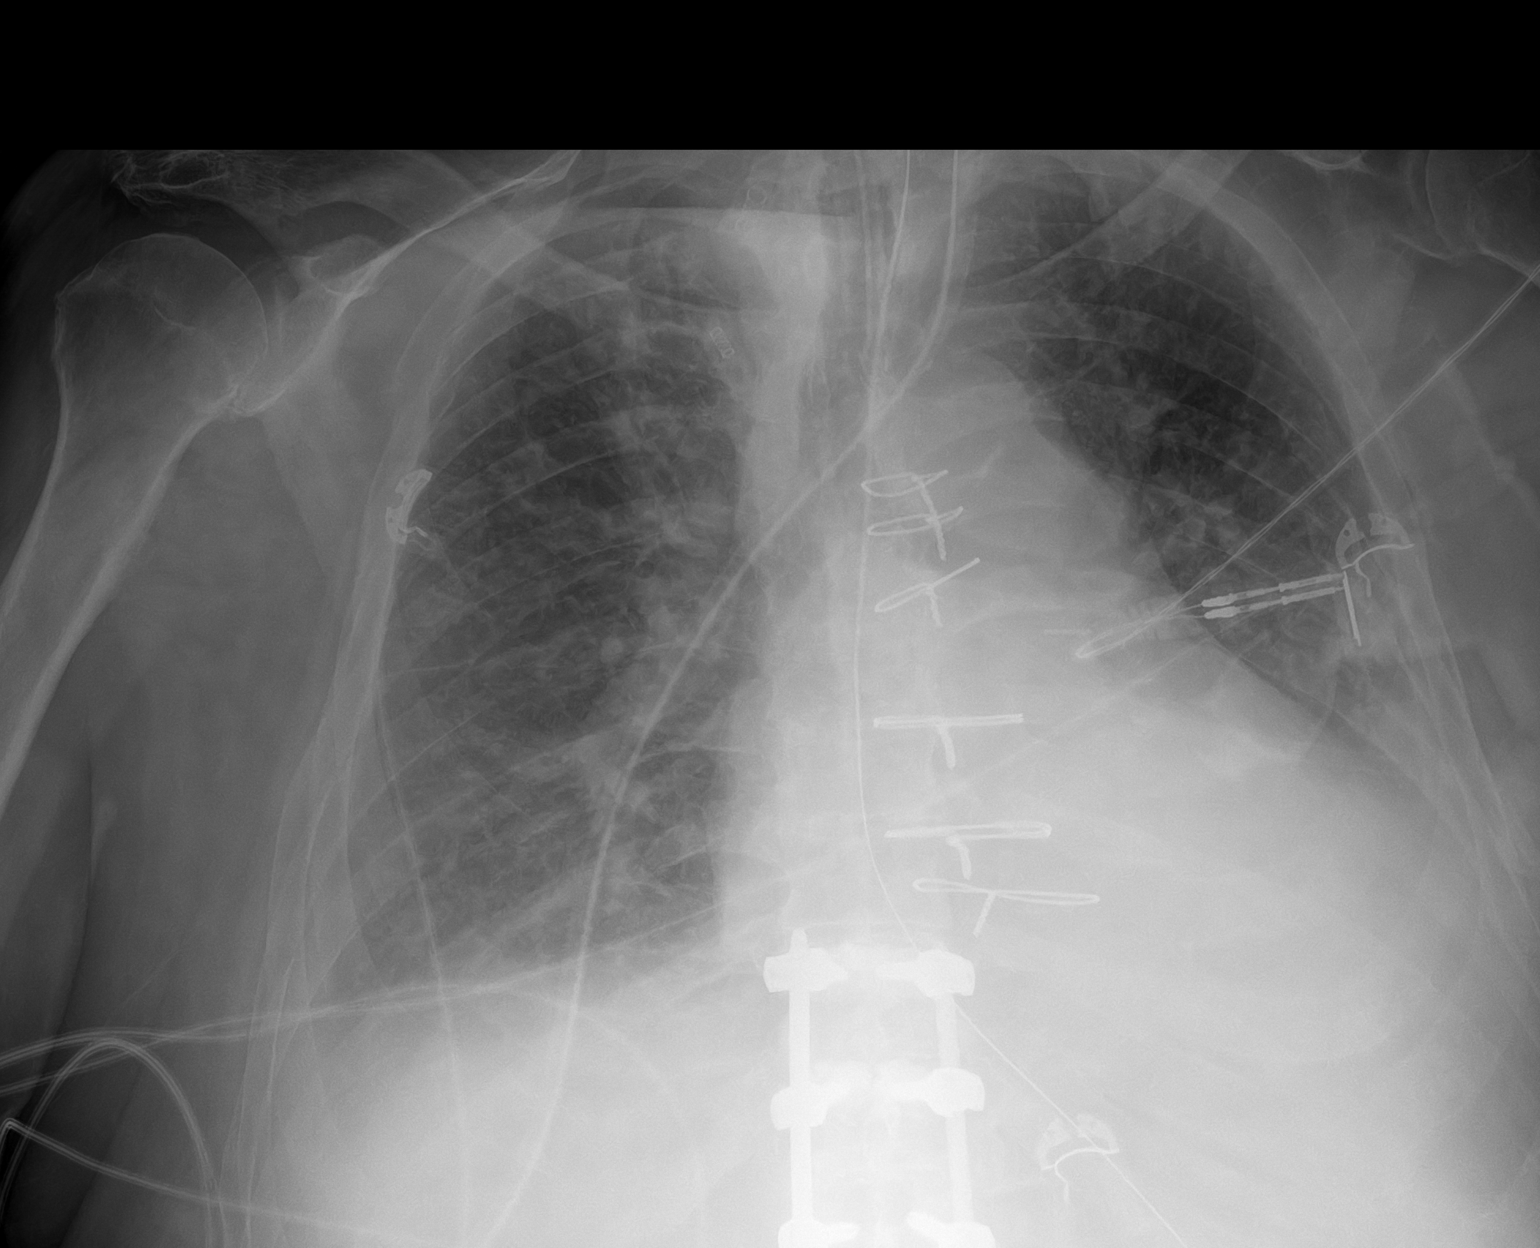
[im 2/2]
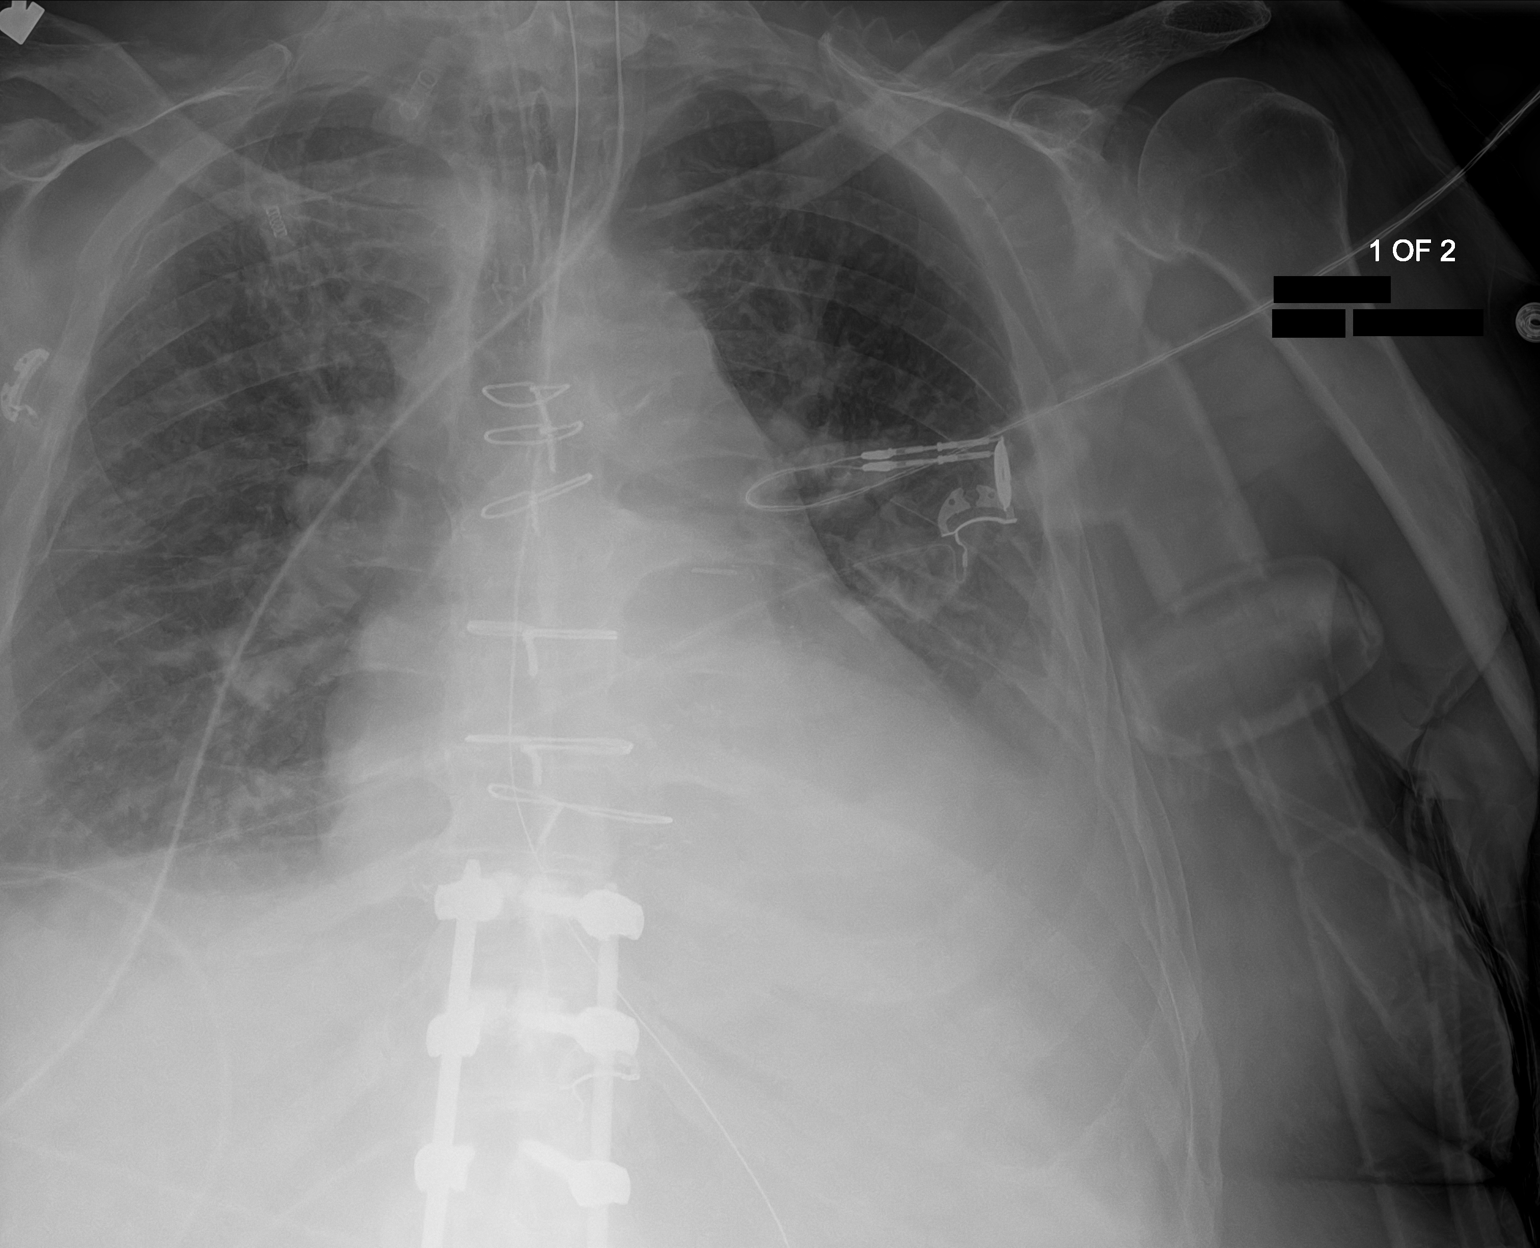

[2 of 2 positions shown; findings below may reference images not displayed]

FINDINGS: The endotracheal tube and NG tubes are stable.

Stable mild cardiac enlargement.

Significant interval improved aeration of the left lung probable
resolution of mucous plugging. Persistent left basilar atelectasis
and small bilateral effusions.
IMPRESSION: Stable support apparatus.

Significant interval improved aeration of the left lung with
probable resolution of mucous plugging.

## 2021-09-07 IMAGING — DX DG CHEST 1V PORT
1 series · 1 of 1 positions shown · non-contrast
Comparison: 03/21/2020

CLINICAL DATA: Status post intubation.  COPD.  Former smoker.

EXAM:
PORTABLE CHEST 1 VIEW

[chest ap]
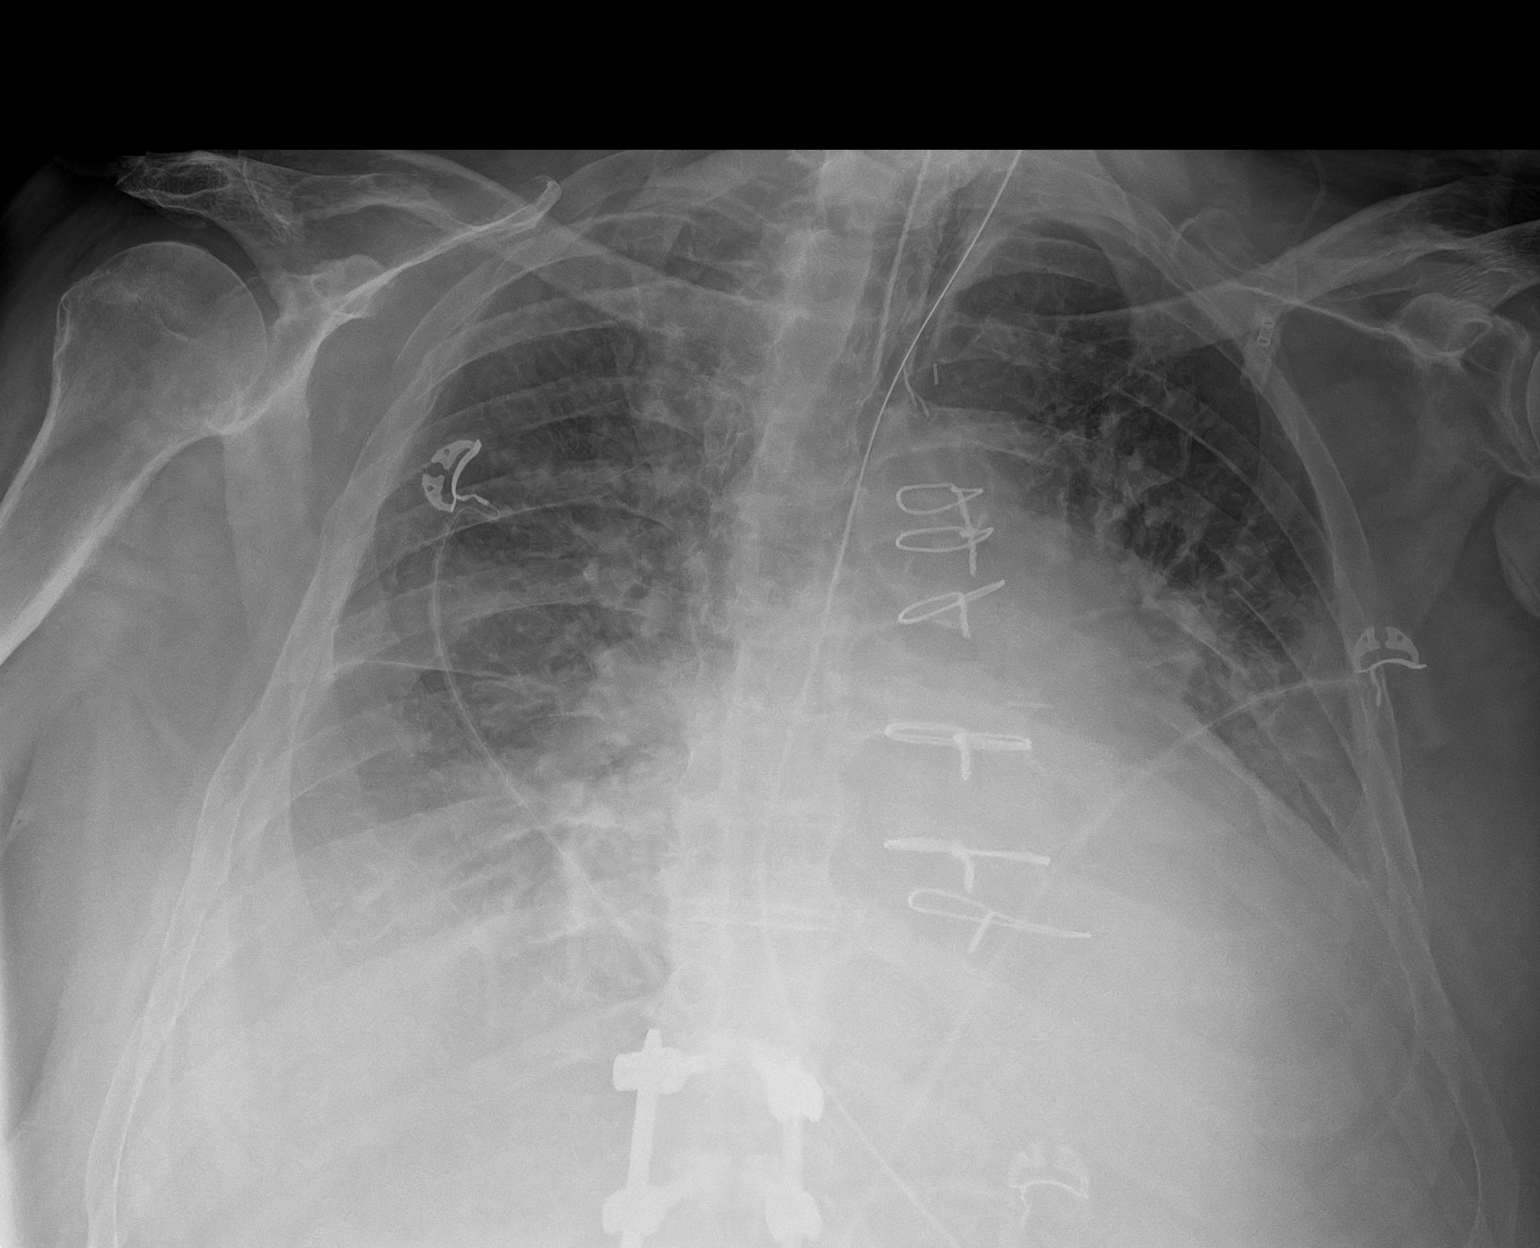

[1 of 1 positions shown; findings below may reference images not displayed]

FINDINGS: ET tube tip is above the carina. The NG tube tip is below the GE
junction. Previous median sternotomy. Stable cardiac enlargement.
Moderate to large bilateral pleural effusions are again noted and
appear increased from previous exam. Persistent bibasilar
atelectasis and airspace consolidation.
IMPRESSION: 1. Increase in volume of bilateral pleural effusions.
2. Persistent bibasilar atelectasis and airspace consolidation.

## 2021-09-11 IMAGING — DX DG ABDOMEN 1V
1 series · 2 of 2 positions shown · non-contrast
Comparison: None.

CLINICAL DATA: Encounter for OG-tube placement.

EXAM:
ABDOMEN - 1 VIEW

[Series 1: abdomen · 0.14mm/px · 2 of 2 slices shown]
[im 1/2]
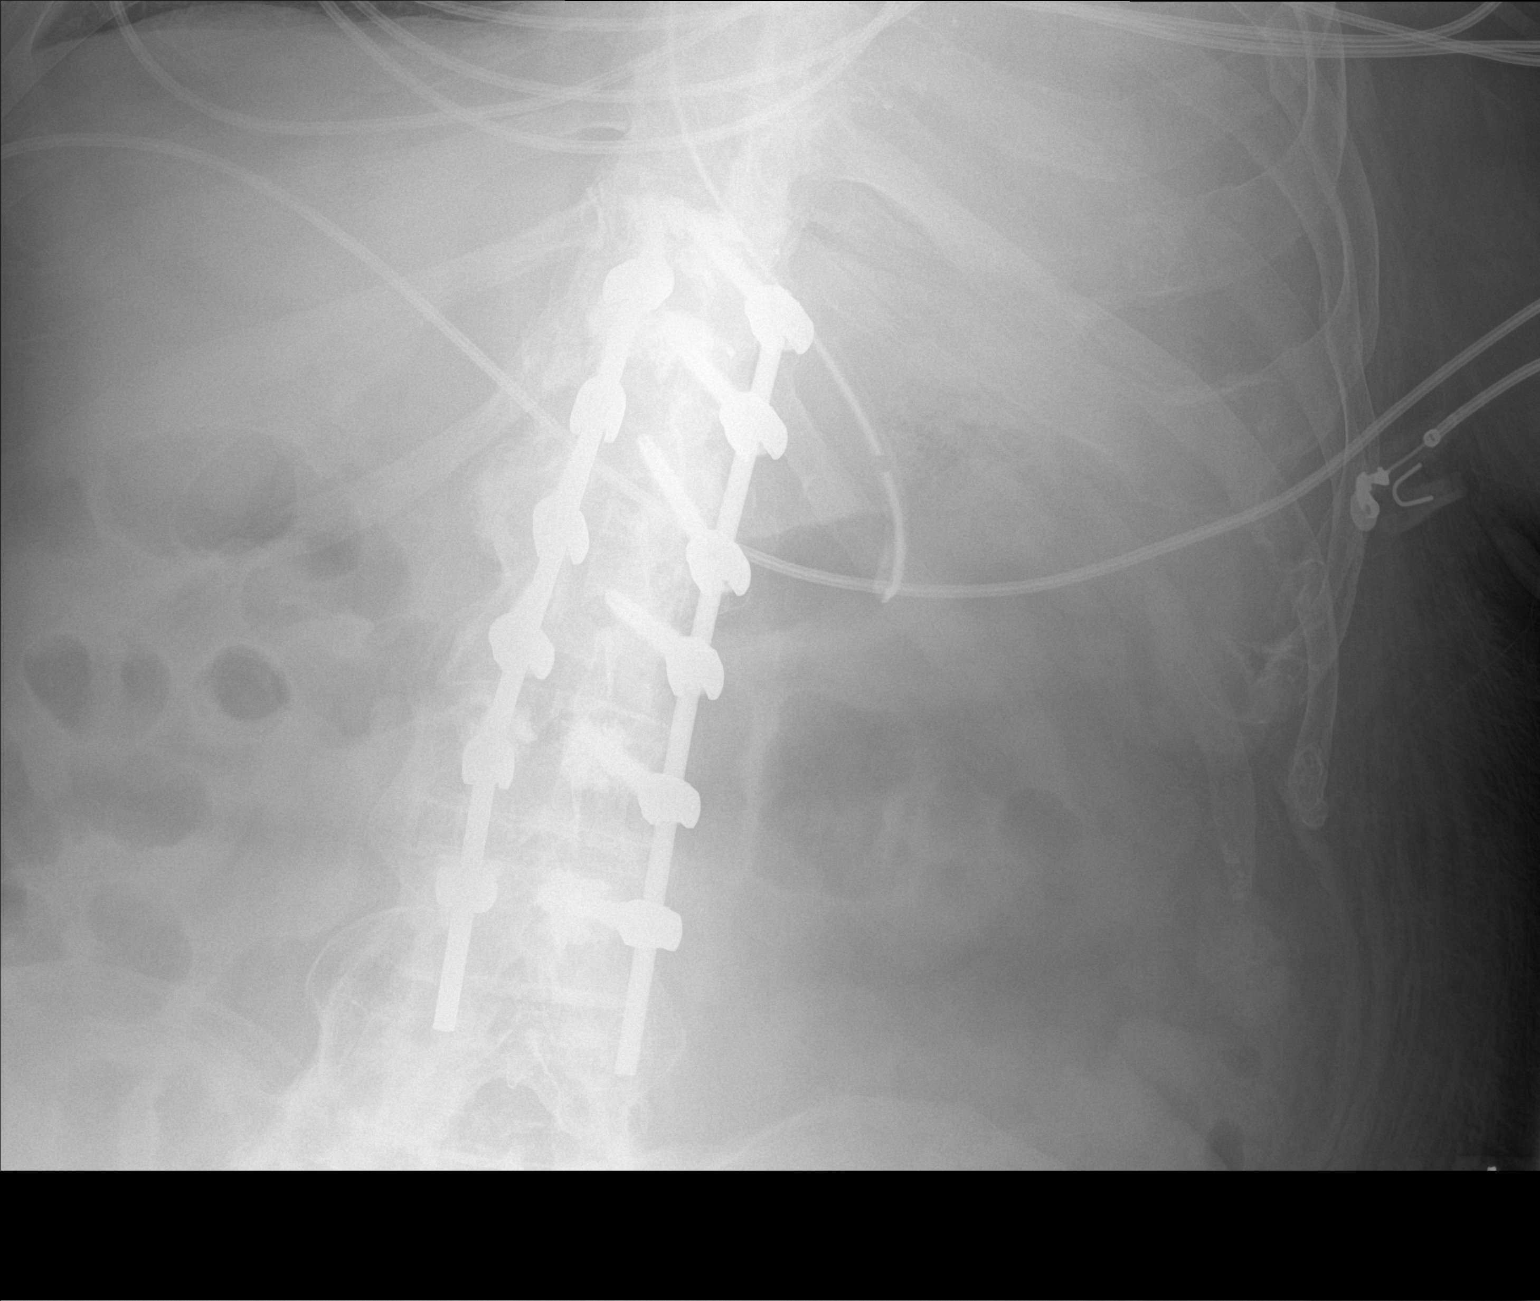
[im 2/2]
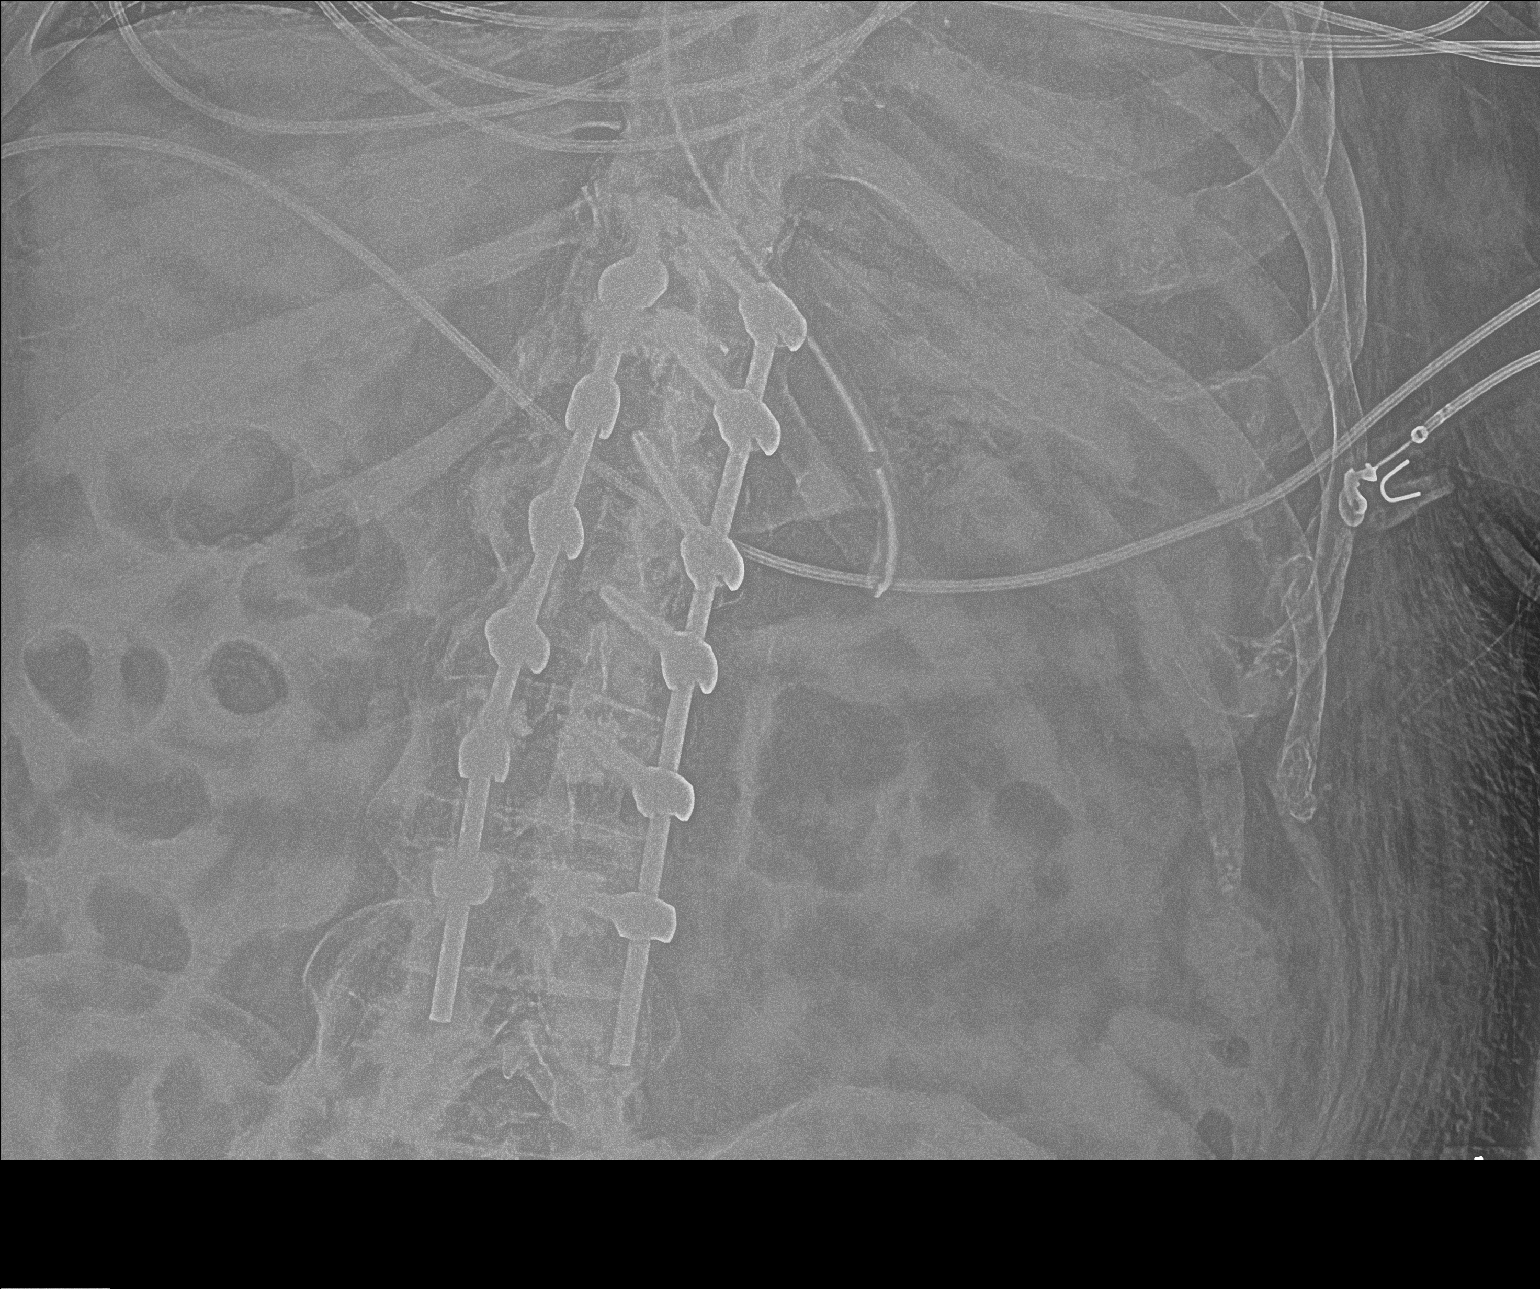

[2 of 2 positions shown; findings below may reference images not displayed]

FINDINGS: Tip and side port of the enteric tube below the diaphragm in the
left upper quadrant of the abdomen, expected location of the
stomach. Air scattered throughout nondilated bowel loops throughout
the abdomen.
IMPRESSION: Tip and side port of the enteric tube below the diaphragm in the
stomach.

## 2021-09-21 IMAGING — DX DG CHEST 1V PORT
1 series · 1 of 1 positions shown · non-contrast
Comparison: 04/04/2020 chest radiograph.

CLINICAL DATA: Intubated

EXAM:
PORTABLE CHEST 1 VIEW

[chest]
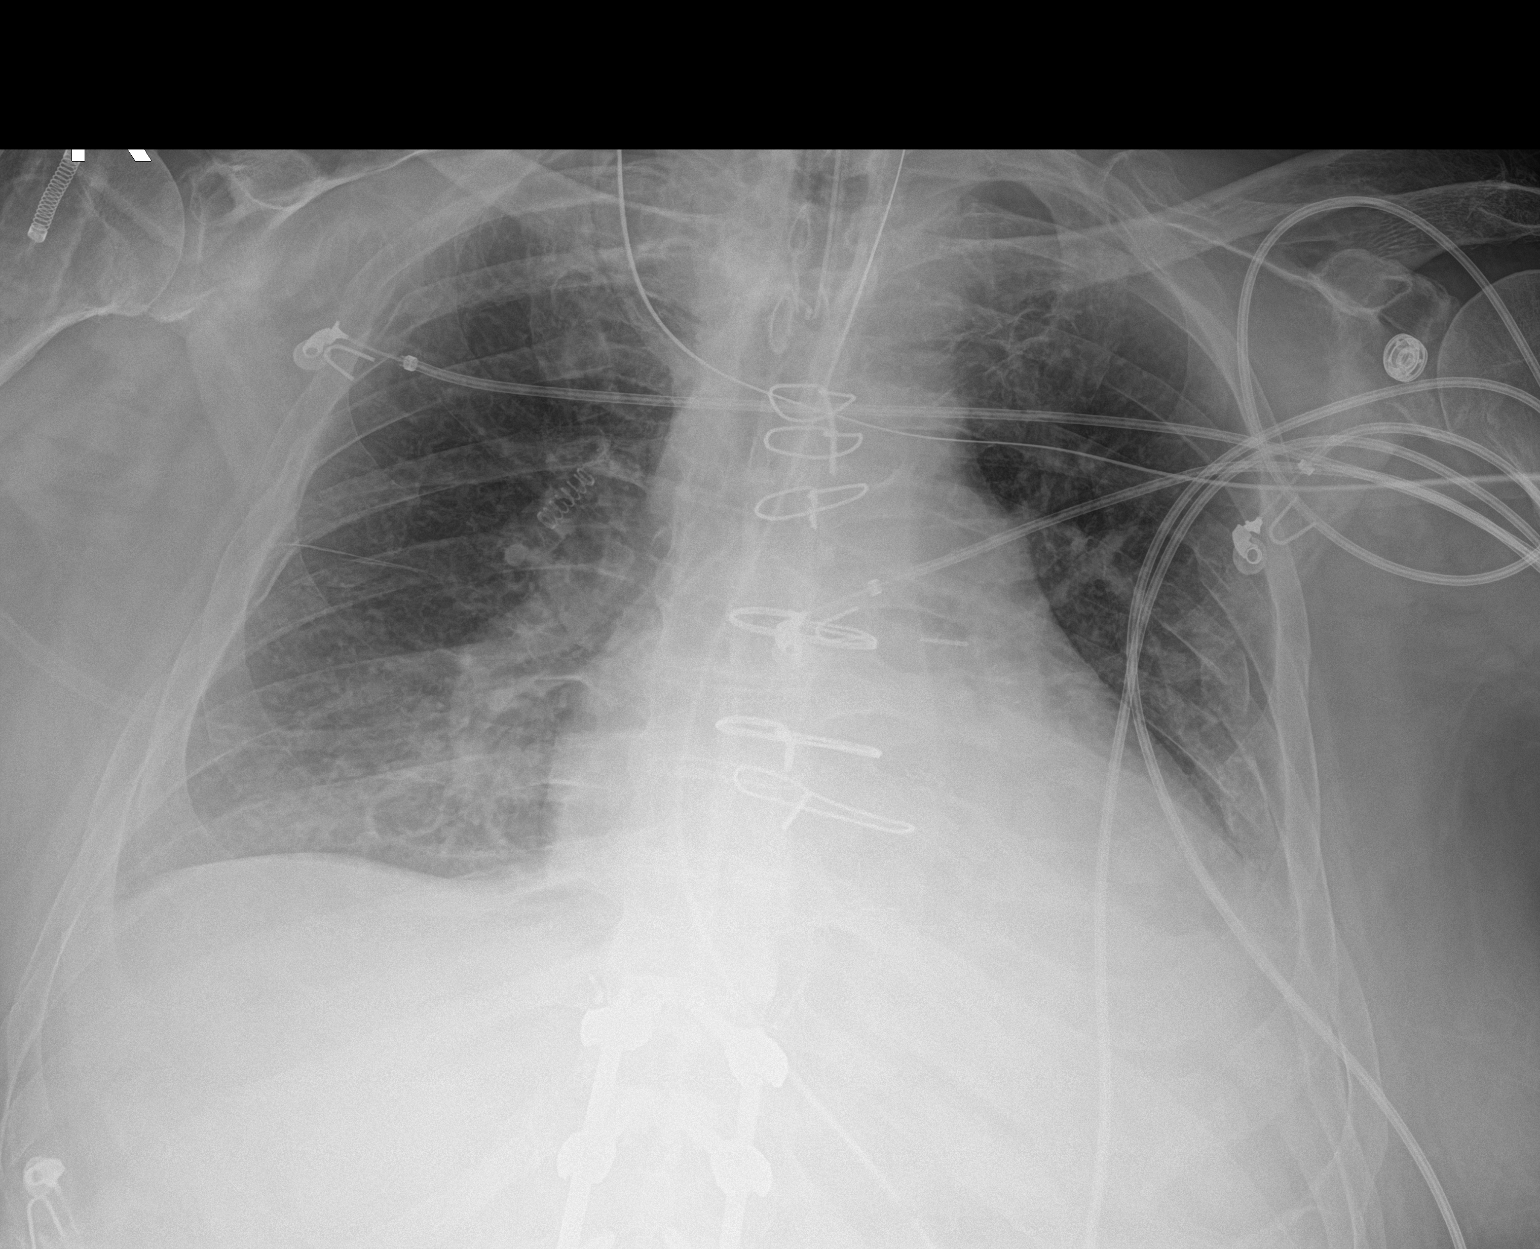

[1 of 1 positions shown; findings below may reference images not displayed]

FINDINGS: Endotracheal tube tip is 5.6 cm above the carina. Enteric tube
enters stomach with the tip not seen on this image. Intact
sternotomy wires. Stable cardiomediastinal silhouette with mild
cardiomegaly. No pneumothorax. Probable stable small left pleural
effusion. No significant right pleural effusion. No overt pulmonary
edema. Left retrocardiac opacity is unchanged.
IMPRESSION: 1. Well-positioned support structures.
2. Stable mild cardiomegaly without overt pulmonary edema.
3. Stable small left pleural effusion and left retrocardiac lung
opacity, favor atelectasis.

## 2021-09-23 IMAGING — DX DG ABDOMEN 1V
1 series · 1 of 1 positions shown · non-contrast
Comparison: 03/30/2020

CLINICAL DATA: NG tube placement.

EXAM:
ABDOMEN - 1 VIEW

[abdomen kub]
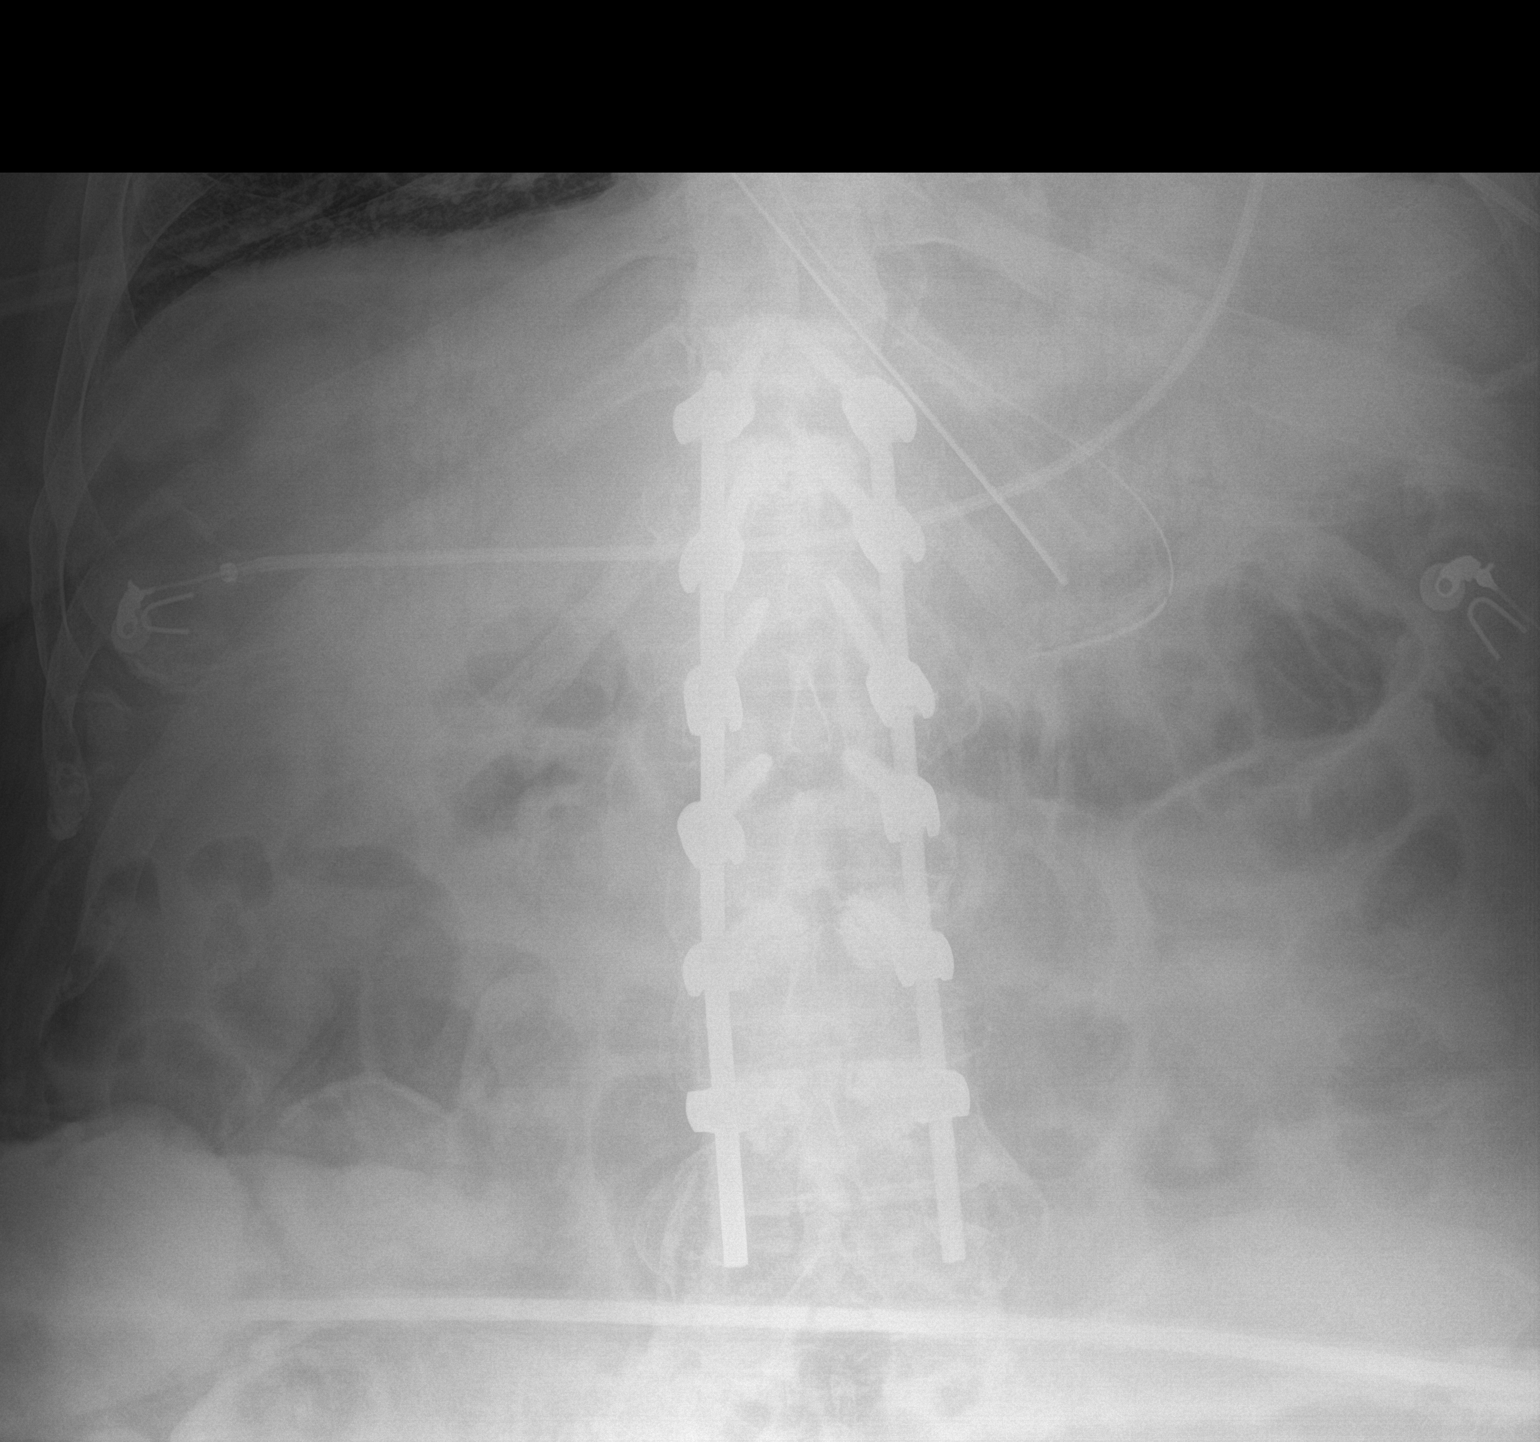

[1 of 1 positions shown; findings below may reference images not displayed]

FINDINGS: The NG tube tip is in the body region the stomach.

Persistent dilated air-filled small bowel.  No free air.
IMPRESSION: NG tube tip is in the body region of the stomach.

## 2021-09-25 IMAGING — DX DG ABD PORTABLE 1V
1 series · 1 of 1 positions shown · non-contrast
Comparison: 04/13/2020

CLINICAL DATA: NG tube placement

EXAM:
PORTABLE ABDOMEN - 1 VIEW

[abdomen kub]
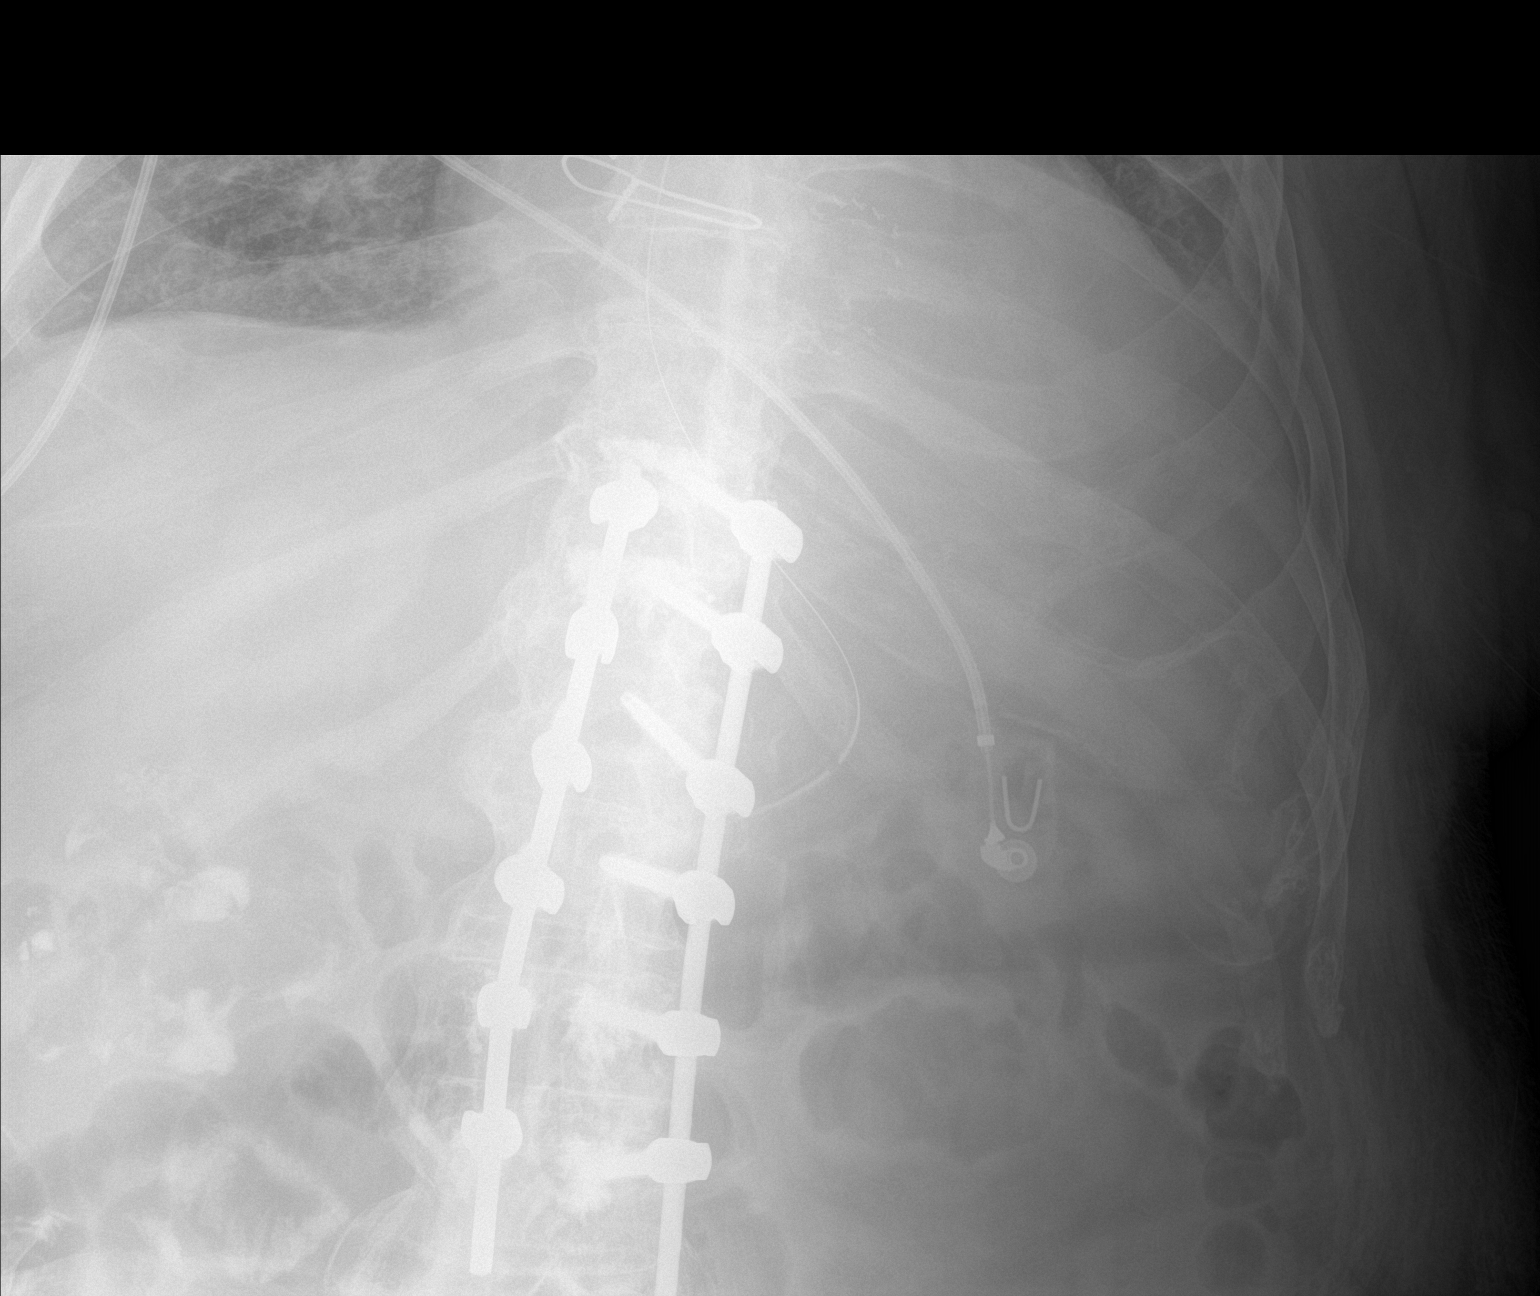

[1 of 1 positions shown; findings below may reference images not displayed]

FINDINGS: The enteric tube tip projects over the gastric body, unchanged from
prior study. The visualized bowel gas pattern is nonspecific.
IMPRESSION: Enteric tube projects over the gastric body.

## 2021-09-25 IMAGING — DX DG ABD PORTABLE 1V
1 series · 1 of 1 positions shown · non-contrast
Comparison: 04/11/2020

CLINICAL DATA: Ileus

EXAM:
PORTABLE ABDOMEN - 1 VIEW

[abdomen kub]
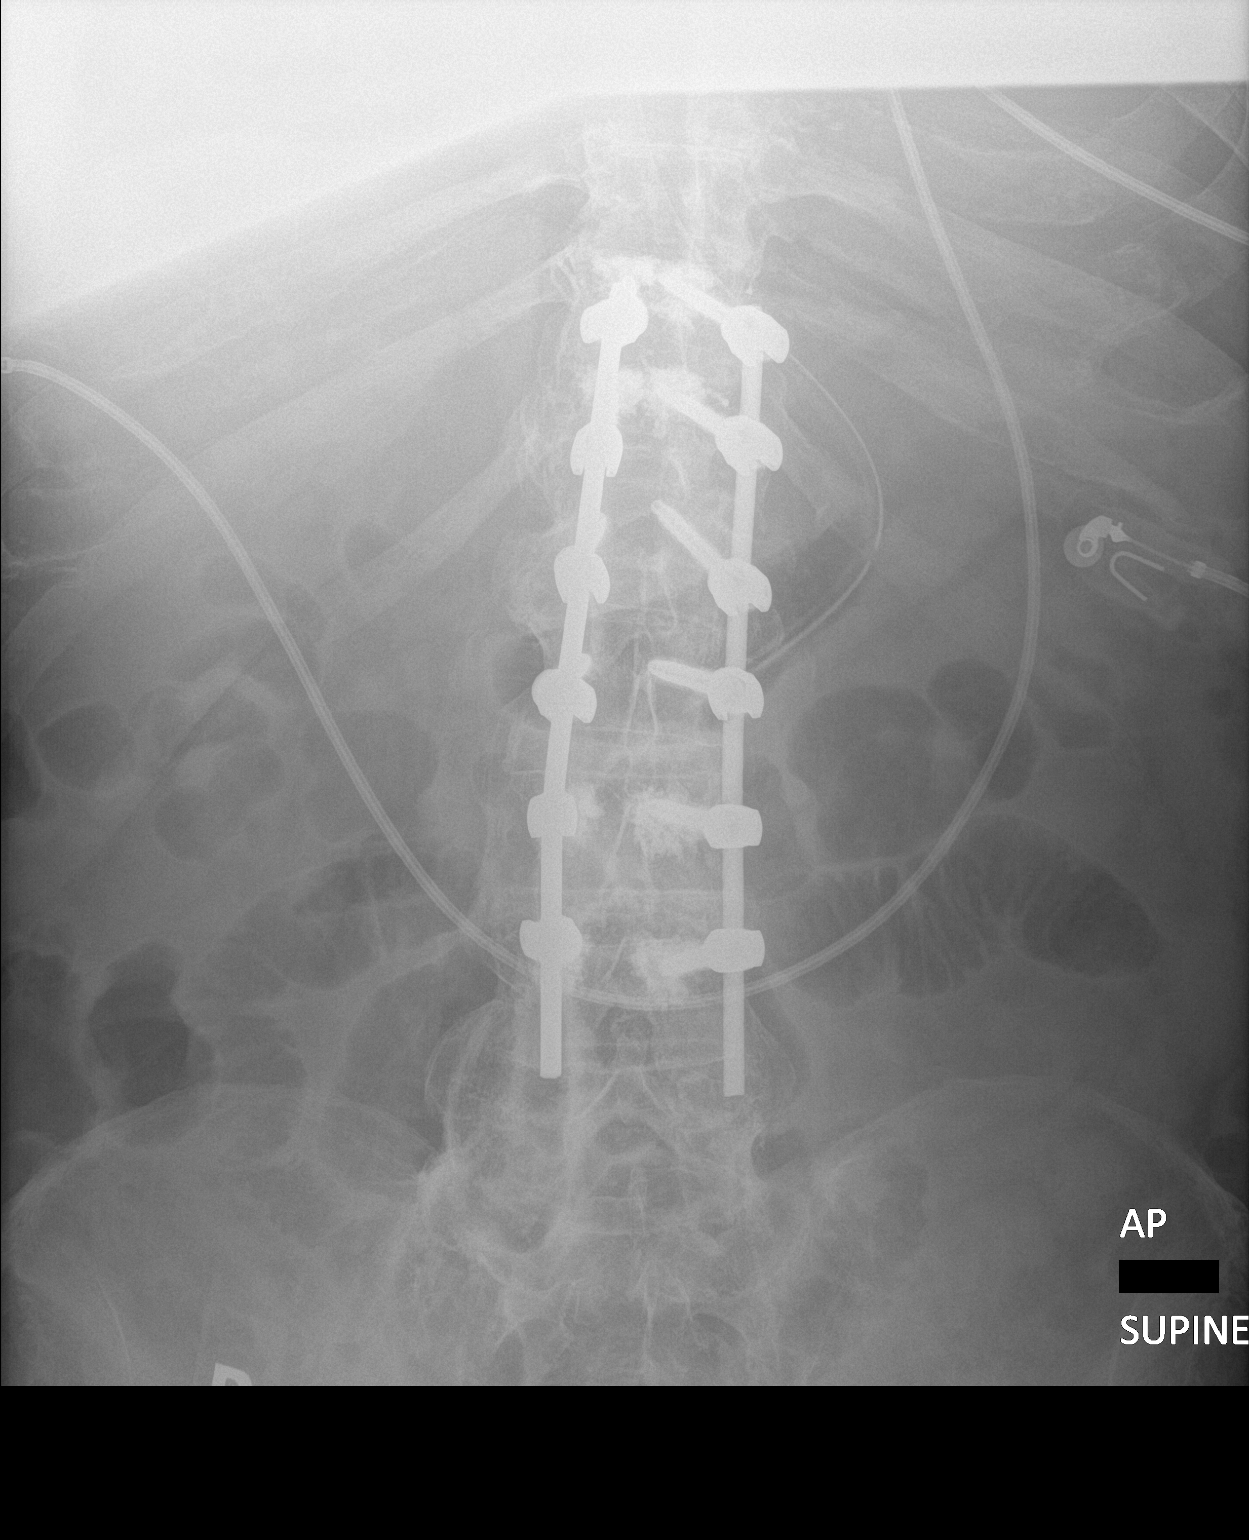

[1 of 1 positions shown; findings below may reference images not displayed]

FINDINGS: 6045 hours. NG tube tip is in the mid to distal stomach. Diffuse
gaseous small bowel distention seen previously has decreased in the
interval. Extensive thoracolumbar fusion hardware evident.
IMPRESSION: NG tube tip is in the mid to distal stomach.

## 2021-09-29 IMAGING — DX DG CHEST 1V PORT
1 series · 1 of 1 positions shown · non-contrast
Comparison: 04/09/2020

CLINICAL DATA: Respiratory distress.

EXAM:
PORTABLE CHEST 1 VIEW

[chest]
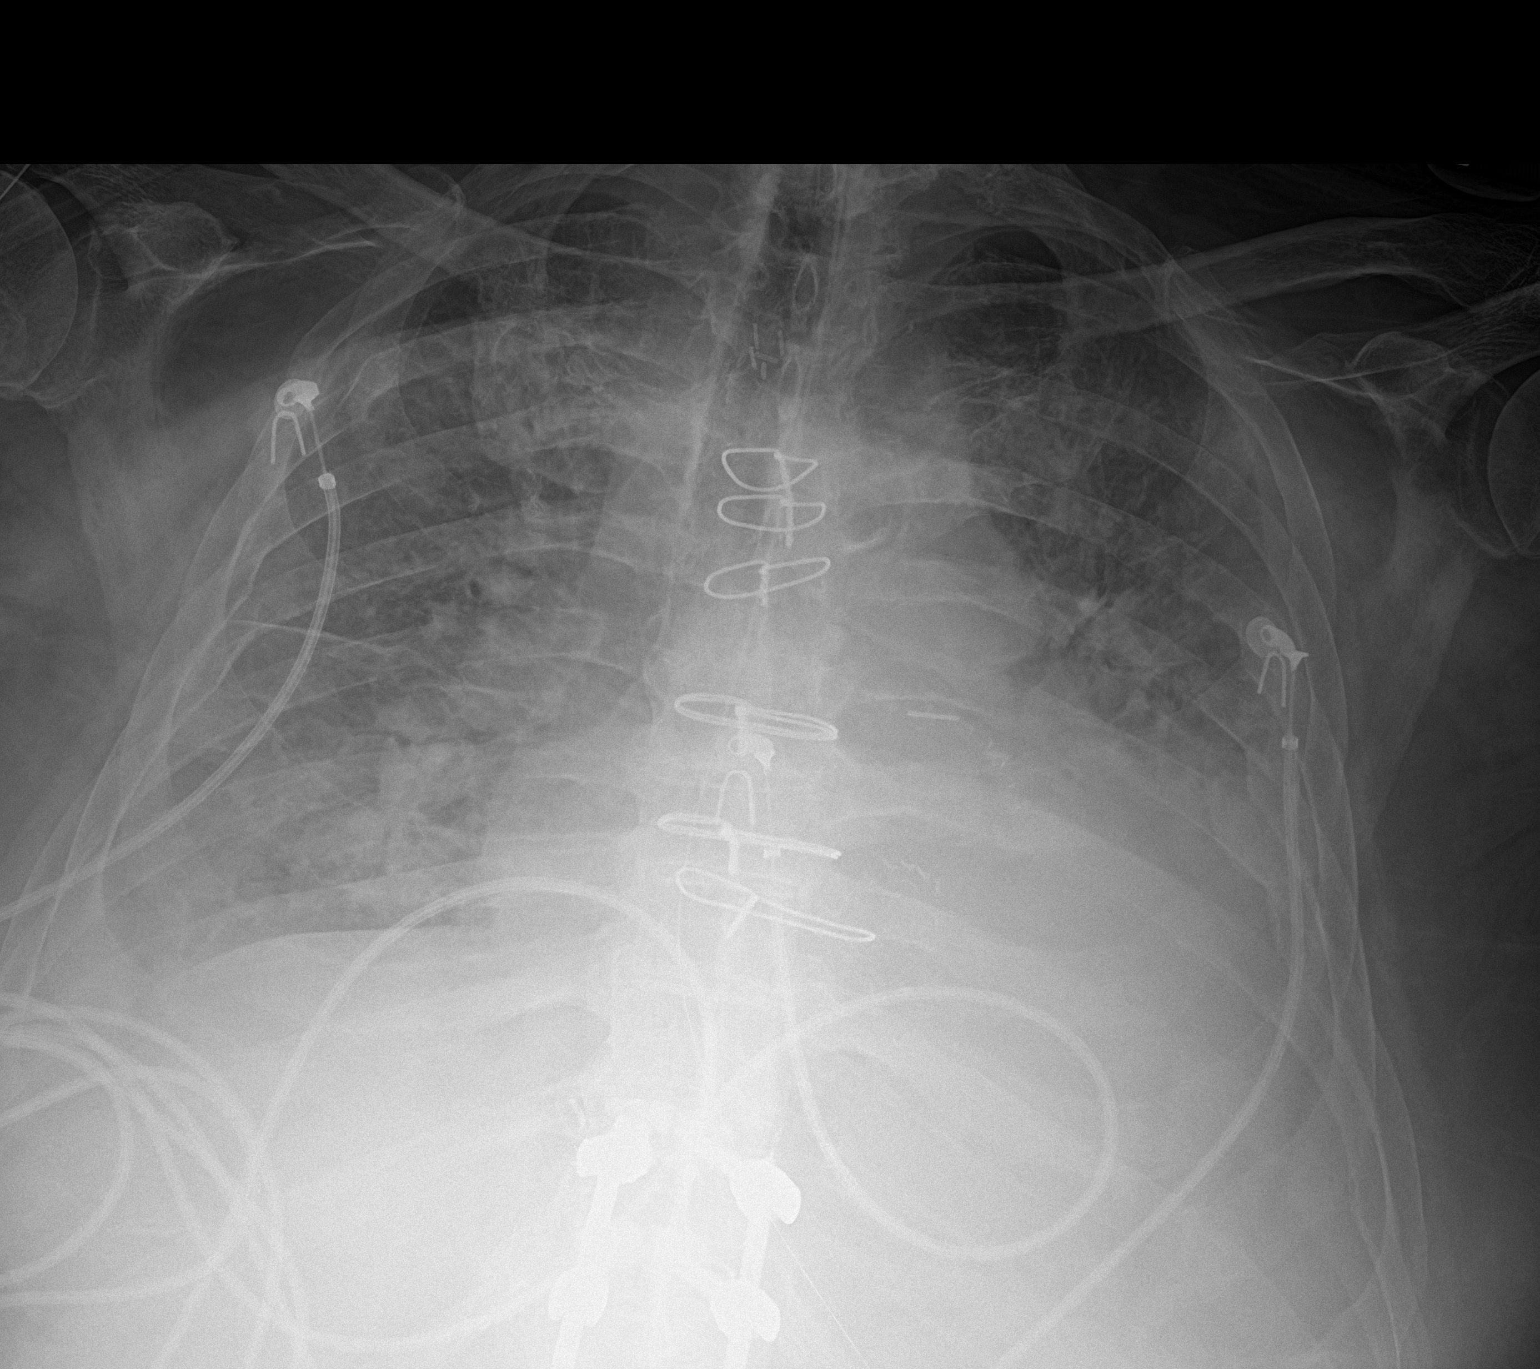

[1 of 1 positions shown; findings below may reference images not displayed]

FINDINGS: The endotracheal tube has been removed. An enteric tube courses into
the abdomen with tip not imaged. The cardiac silhouette remains
enlarged. Aortic atherosclerosis and prior sternotomy are noted.
There is pulmonary vascular congestion with interstitial and hazy
airspace opacities bilaterally. There is persistent asymmetric
retrocardiac opacity in the left lung base, and there are small
bilateral pleural effusions. No pneumothorax is identified.
IMPRESSION: Interval extubation. Cardiomegaly with edema and small bilateral
pleural effusions. Persistent asymmetric left basilar opacity may
reflect atelectasis or pneumonia.

## 2021-09-30 IMAGING — DX DG CHEST 1V PORT
1 series · 1 of 1 positions shown · non-contrast
Comparison: Single-view of the chest 04/17/2020 and 04/09/2020

CLINICAL DATA: Respiratory failure.  History of COPD.

EXAM:
PORTABLE CHEST 1 VIEW

[chest ap]
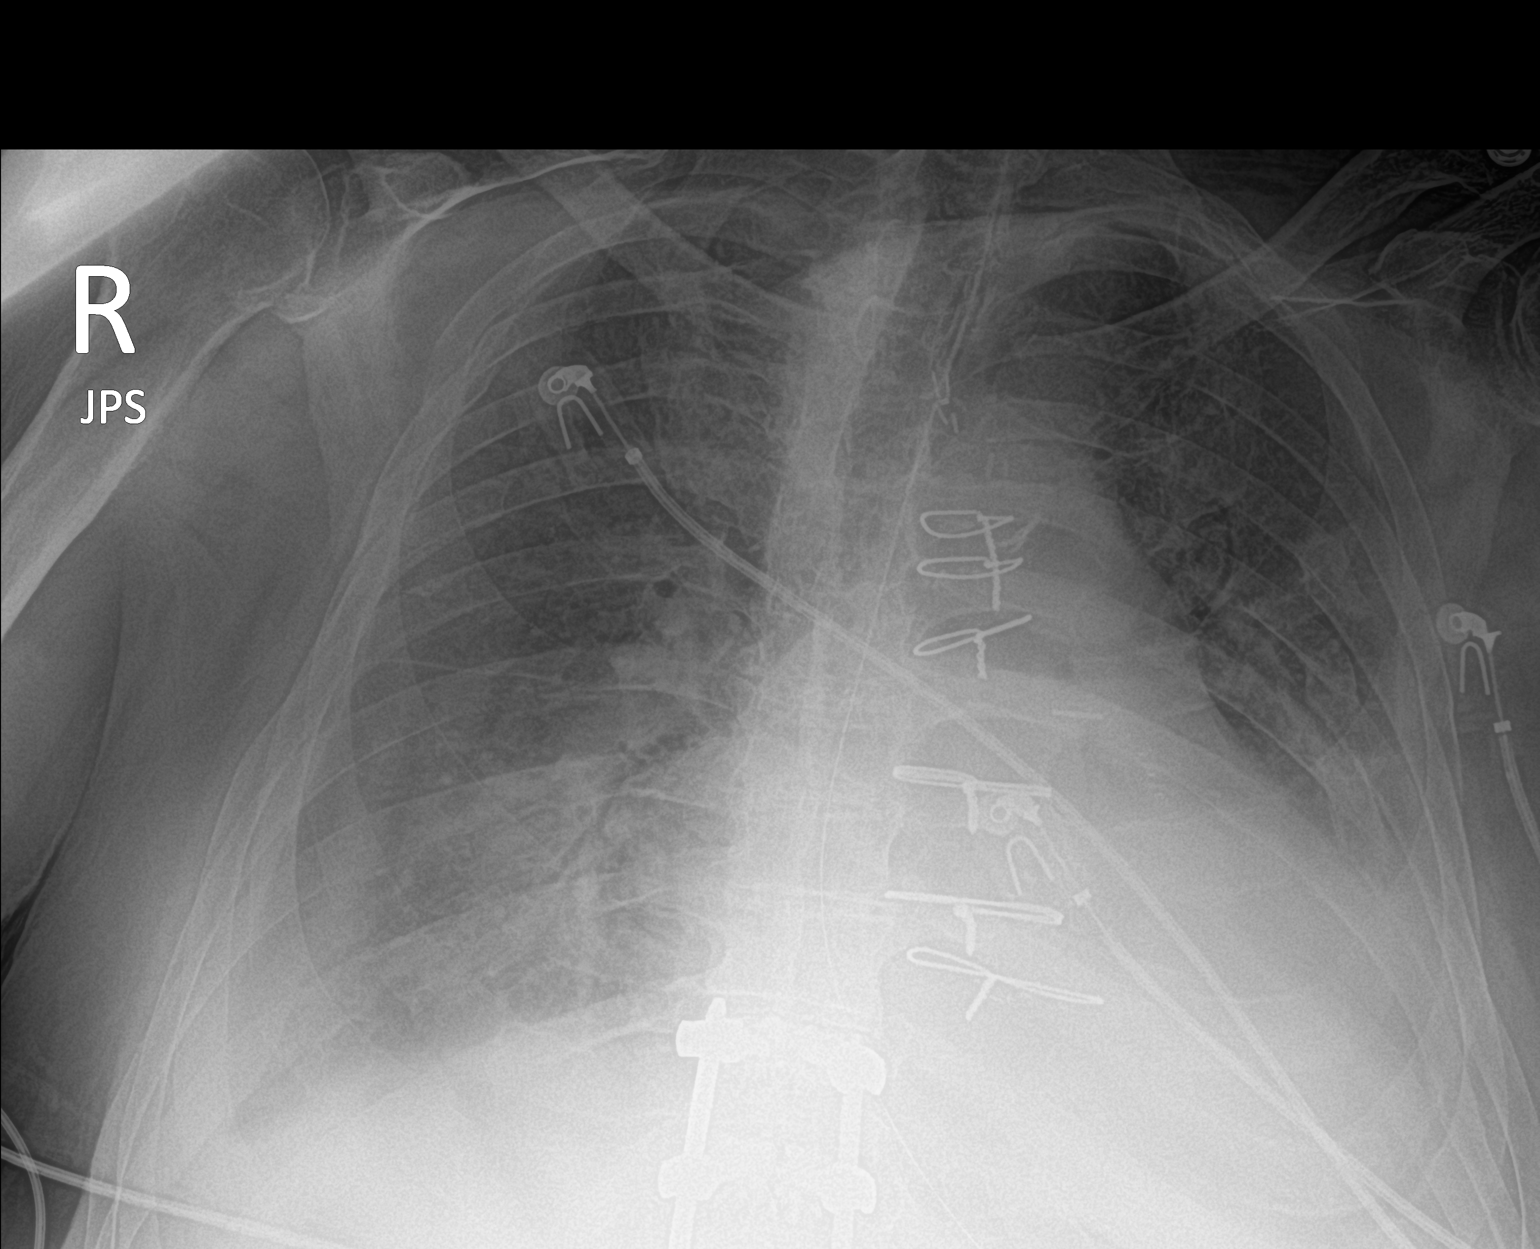

[1 of 1 positions shown; findings below may reference images not displayed]

FINDINGS: NG tube remains in place. Bilateral pleural effusions and diffuse
airspace disease appear unchanged since the most recent exam.
Cardiomegaly. No pneumothorax. Remote bilateral rib fractures noted.
IMPRESSION: No change in diffuse bilateral airspace disease and pleural
effusions.

## 2021-10-01 IMAGING — DX DG CHEST 1V
1 series · 1 of 1 positions shown · non-contrast
Comparison: Earlier same day.

CLINICAL DATA: Post left thoracentesis

EXAM:
CHEST  1 VIEW

[chest ap]
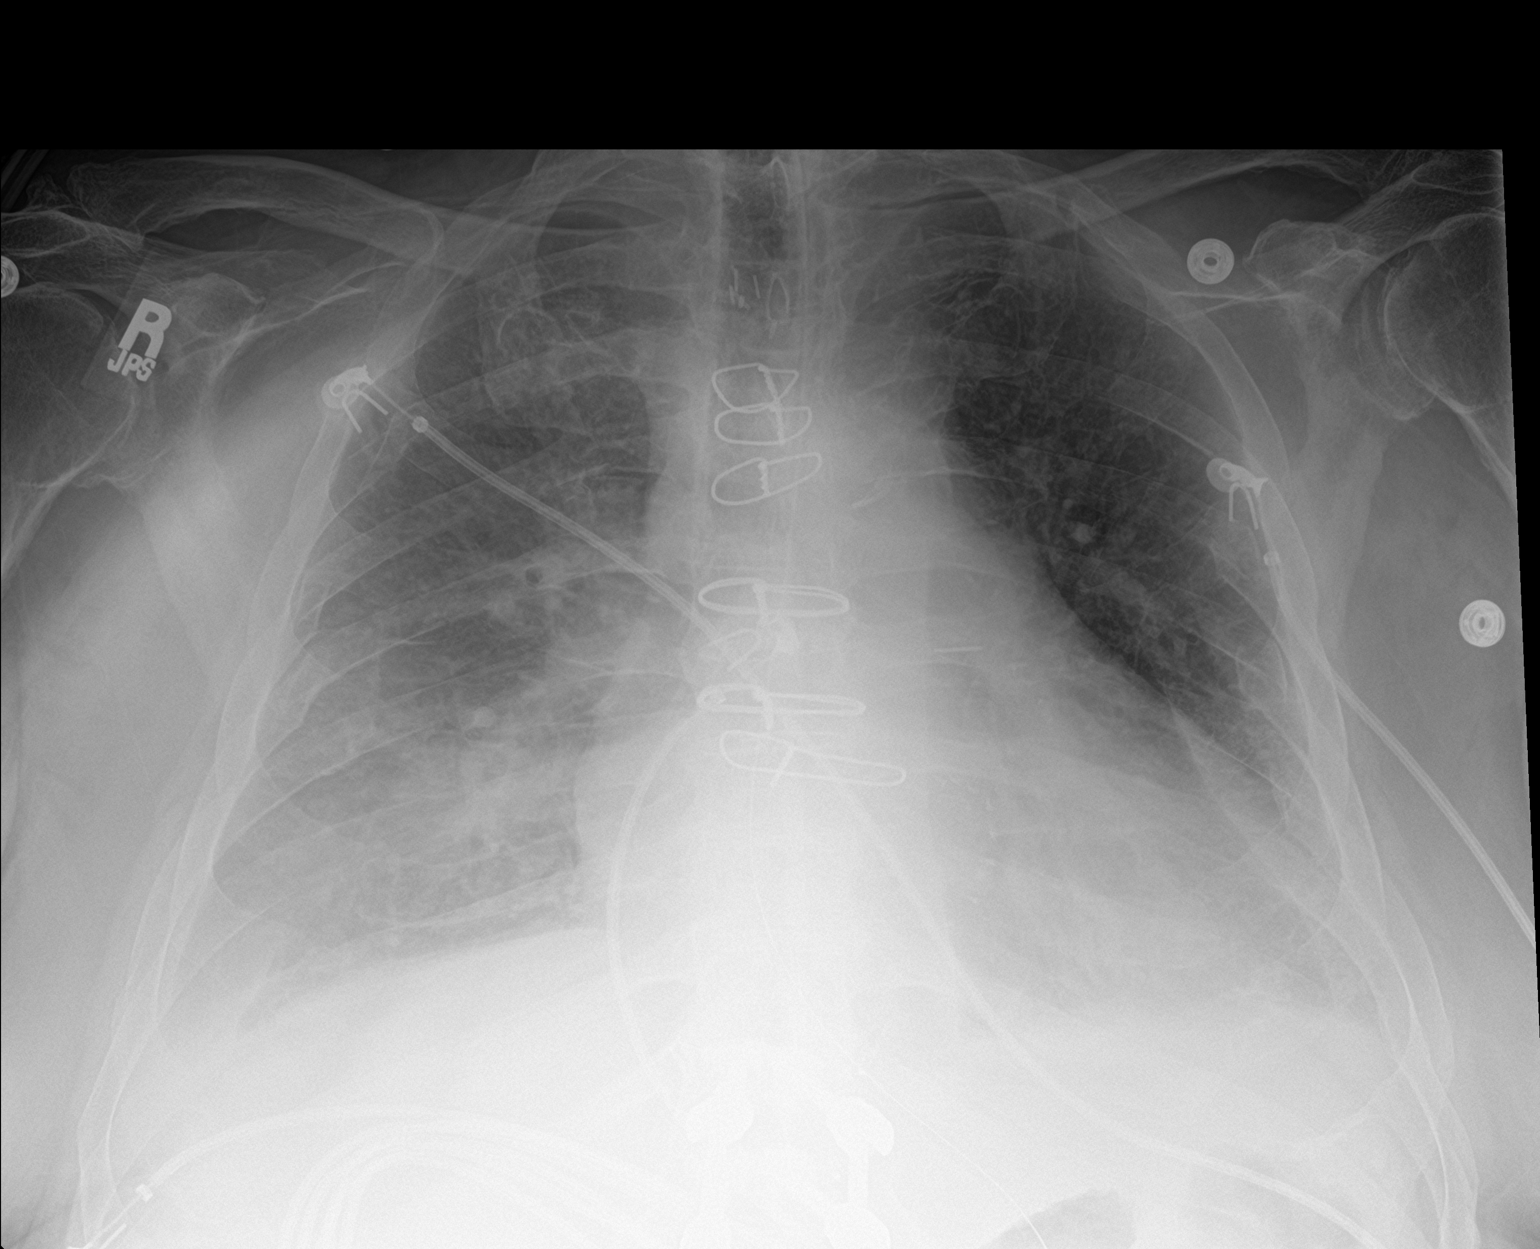

[1 of 1 positions shown; findings below may reference images not displayed]

FINDINGS: Decreased left pleural effusion and improved left basilar lung
aeration post thoracentesis. No pneumothorax. Similar right pleural
effusion. Similar patchy bilateral opacities. Stable heart size.
IMPRESSION: Decreased left pleural effusion and improved aeration at the left
lung base post thoracentesis. No pneumothorax. Otherwise unchanged
appearance.

## 2021-10-01 IMAGING — DX DG CHEST 1V PORT
1 series · 1 of 1 positions shown · non-contrast
Comparison: 04/18/2020

CLINICAL DATA: Pleural effusion.  Pulmonary edema.

EXAM:
PORTABLE CHEST 1 VIEW

[chest ap]
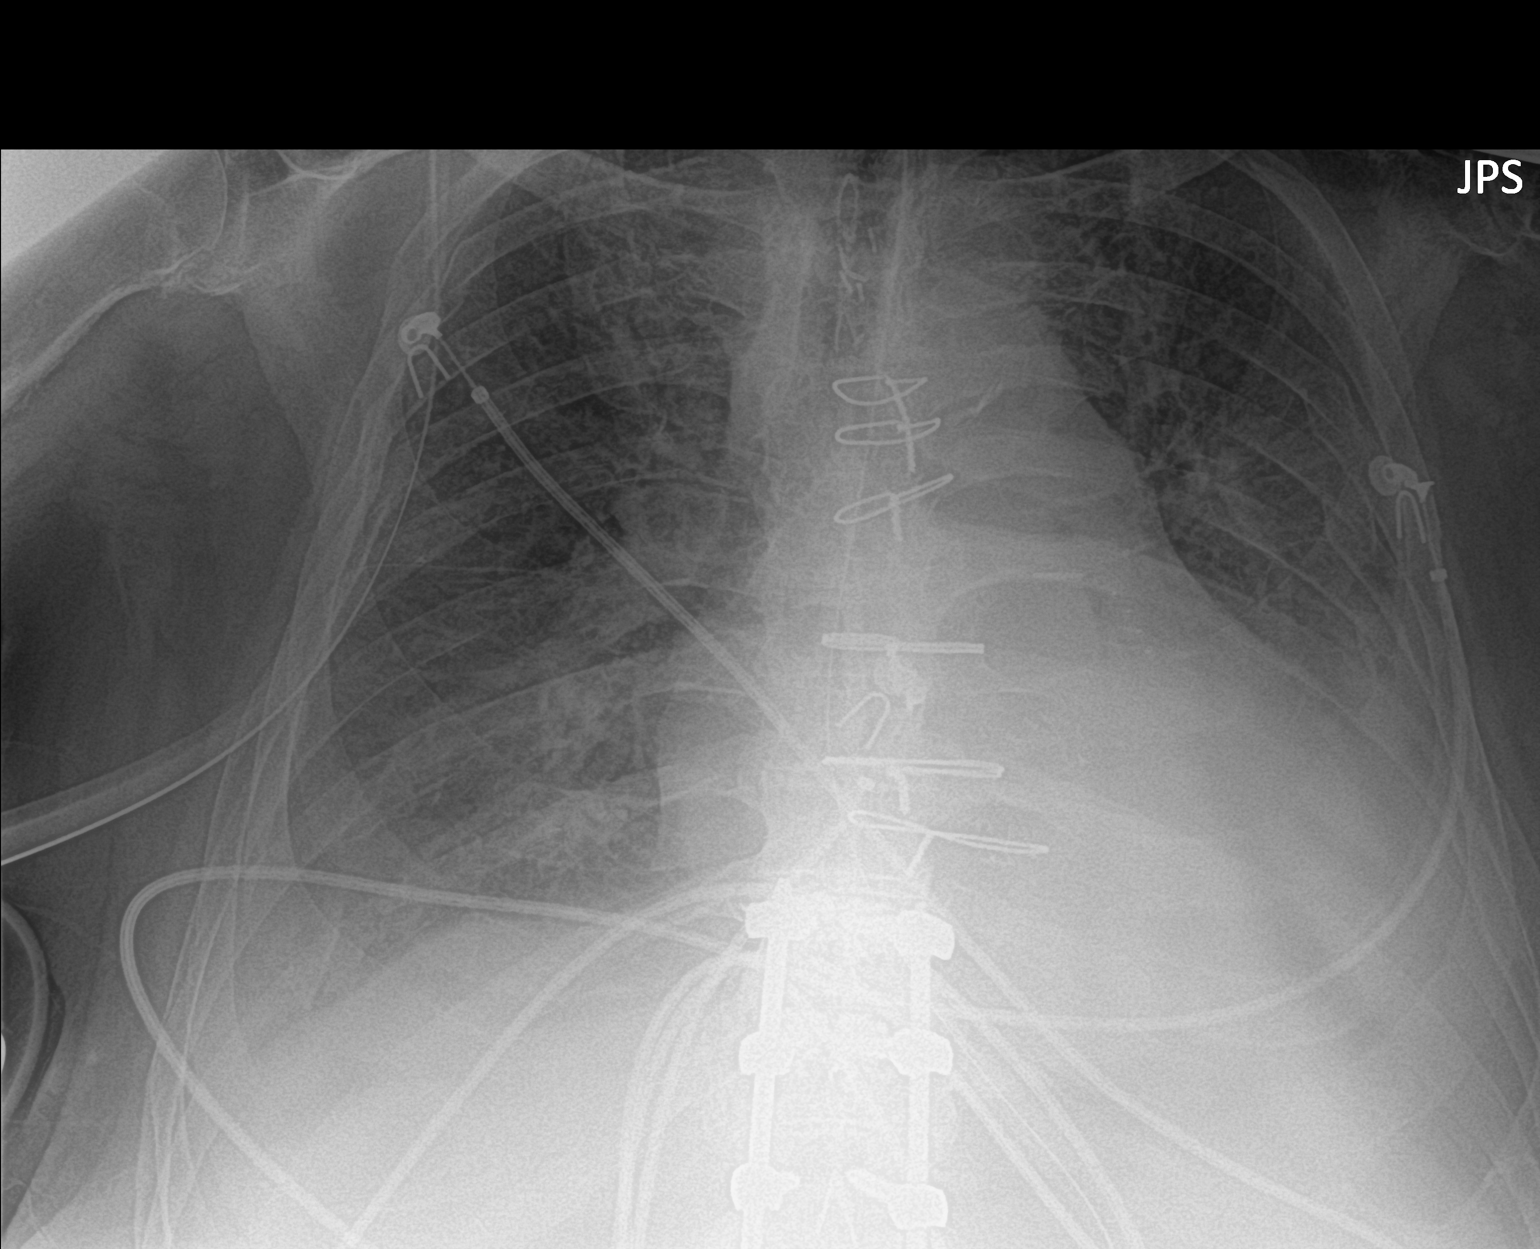

[1 of 1 positions shown; findings below may reference images not displayed]

FINDINGS: 3255 hours. The cardio pericardial silhouette is enlarged. Diffuse
bilateral airspace disease is similar. Retrocardiac
collapse/consolidation again noted with bilateral pleural effusions
similar to prior. The NG tube passes into the stomach although the
distal tip position is not included on the film.
IMPRESSION: No substantial interval change. Diffuse bilateral airspace disease
with retrocardiac collapse/consolidation and bilateral pleural
effusions.

## 2021-10-03 IMAGING — DX DG CHEST 1V PORT
1 series · 1 of 1 positions shown · non-contrast
Comparison: One-view chest x-ray 04/19/2020

CLINICAL DATA: Pleural effusion no pneumothorax. Thoracentesis
yesterday.

EXAM:
PORTABLE CHEST 1 VIEW

[chest ap]
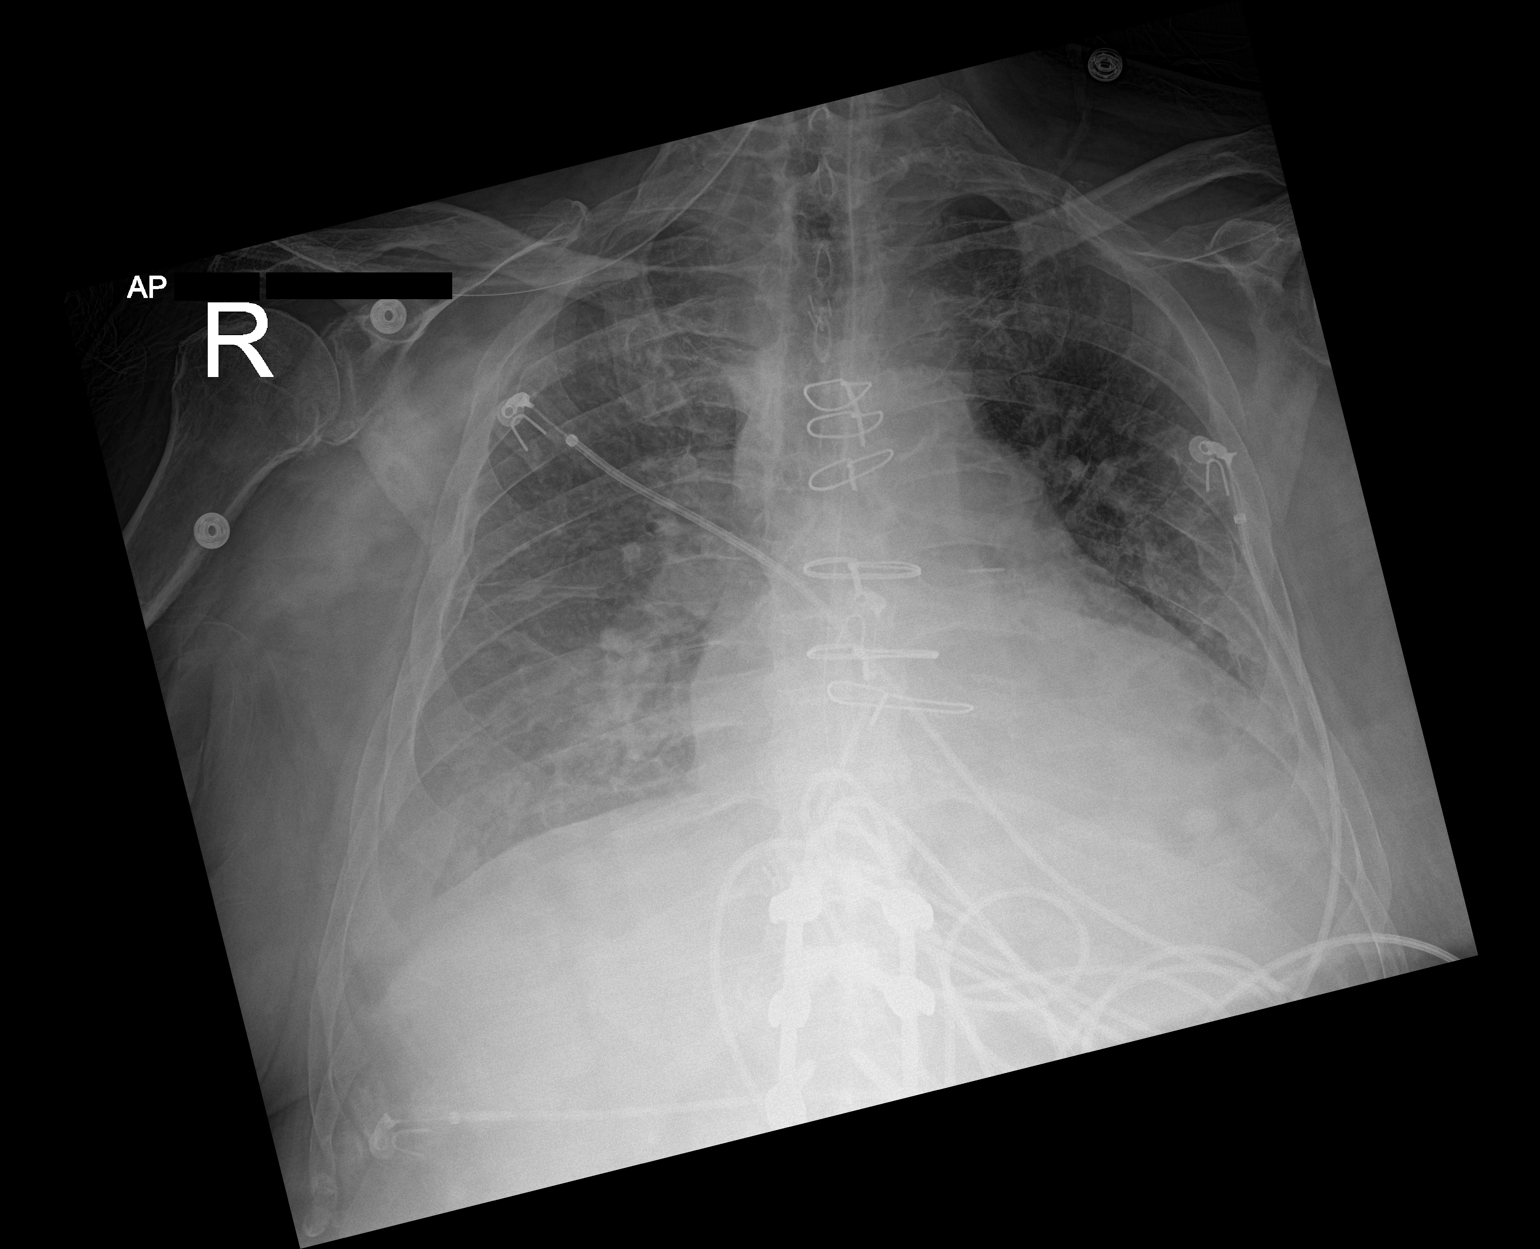

[1 of 1 positions shown; findings below may reference images not displayed]

FINDINGS: Heart is enlarged. Bilateral interstitial edema scratched at
bilateral interstitial and airspace disease is present. Bilateral
pleural effusions are stable. No pneumothorax is present. Enteric
tube courses off the inferior border of the film.
IMPRESSION: 1. Stable cardiomegaly and bilateral interstitial and airspace
disease compatible with congestive heart failure.
2. No pneumothorax.

## 2021-10-05 IMAGING — DX DG CHEST 1V PORT
1 series · 1 of 1 positions shown · non-contrast
Comparison: 04/21/2020

CLINICAL DATA: Pleural effusion

EXAM:
PORTABLE CHEST 1 VIEW

[chest ap]
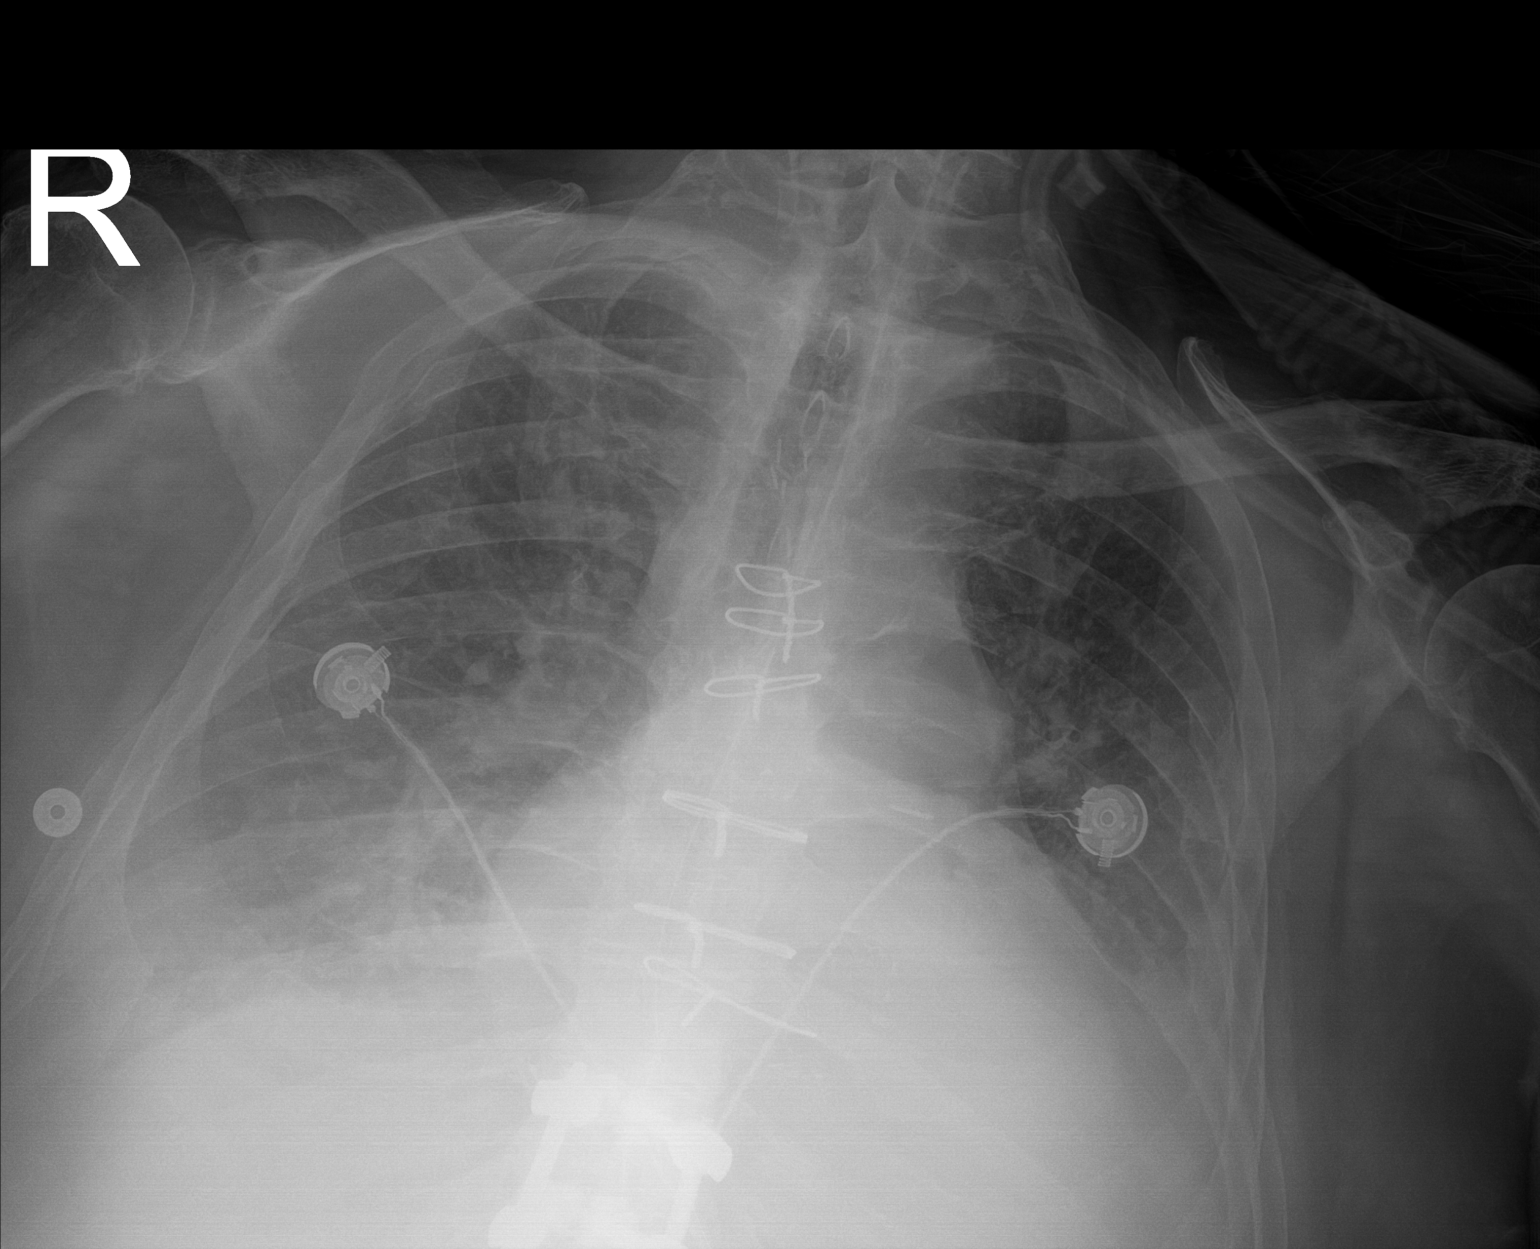

[1 of 1 positions shown; findings below may reference images not displayed]

FINDINGS: Layering moderate bilateral pleural effusions, right greater than
left. Associated lower lobe opacities, likely atelectasis. No
pneumothorax. This appearance is unchanged.

Heart is top-normal in size.

Enteric tube likely courses below the diaphragm but is poorly
evaluated.

Median sternotomy.

Lumbar spine fixation hardware, incompletely visualized.
IMPRESSION: Layering moderate bilateral pleural effusions, right greater than
left, unchanged. Associated lower lobe opacities, likely
atelectasis.

## 2021-10-06 IMAGING — DX DG CHEST 1V PORT
1 series · 1 of 1 positions shown · non-contrast
Comparison: 04/23/2020

CLINICAL DATA: Pleural effusion

EXAM:
PORTABLE CHEST 1 VIEW

[chest ap]
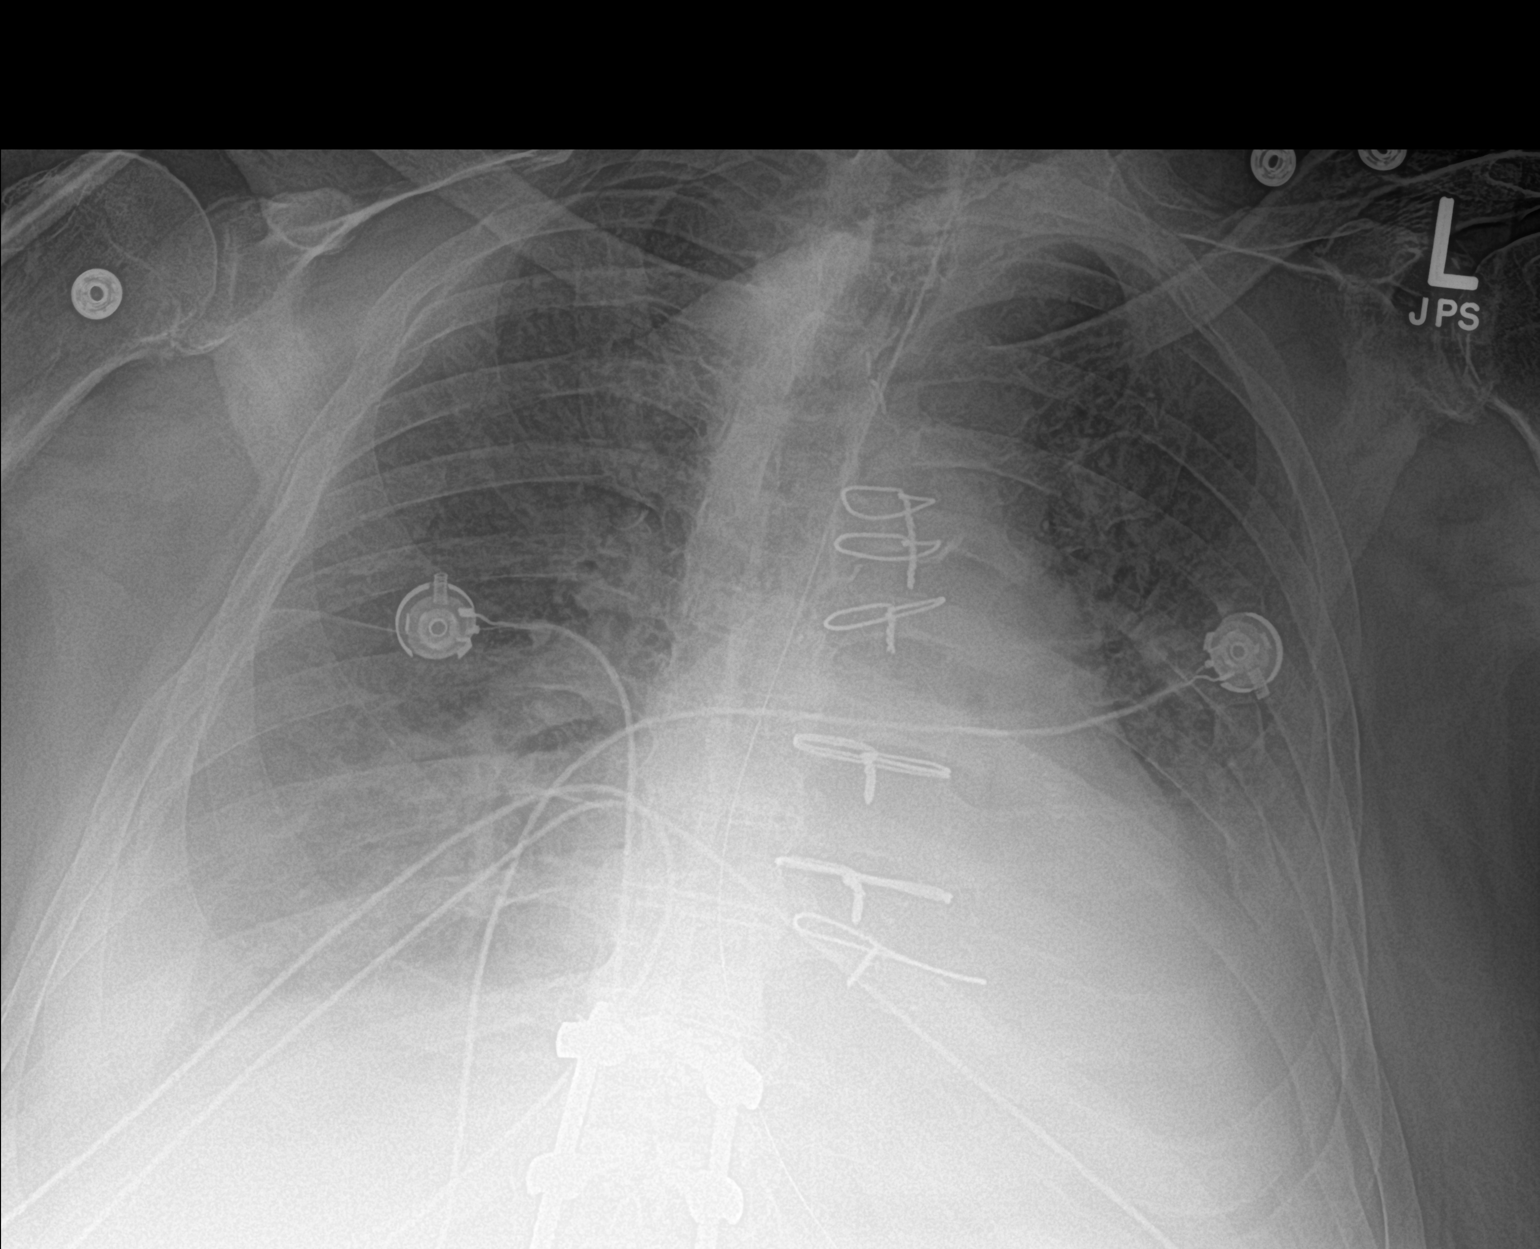

[1 of 1 positions shown; findings below may reference images not displayed]

FINDINGS: Gastric tube courses through in off the field of the radiograph, tip
below the LEFT hemidiaphragm.

Image rotated to the LEFT. Heart size and mediastinal contours
partially obscured and grossly stable accounting for rotation and
partial obscuration.

Increased interstitial markings bilaterally. The graded opacity in
the LEFT and RIGHT hemithorax, greatest on the LEFT similar to the
prior exam.

Basilar airspace disease as before.

On limited assessment no acute skeletal process. Spinal fusion
hardware as before. This is incompletely imaged. Subacute fractures
of LEFT-sided ribs are similar to the prior study.
IMPRESSION: 1. Bilateral effusions and atelectasis/airspace disease, similar to
the prior exam.
2. Increased interstitial markings bilaterally, likely pulmonary
edema also unchanged.
3. Subacute fractures of LEFT-sided ribs.

## 2021-10-08 IMAGING — DX DG CHEST 1V PORT
1 series · 1 of 1 positions shown · non-contrast
Comparison: 04/24/2020.  04/23/2020.

CLINICAL DATA: Pleural effusion.

EXAM:
PORTABLE CHEST 1 VIEW

[chest ap]
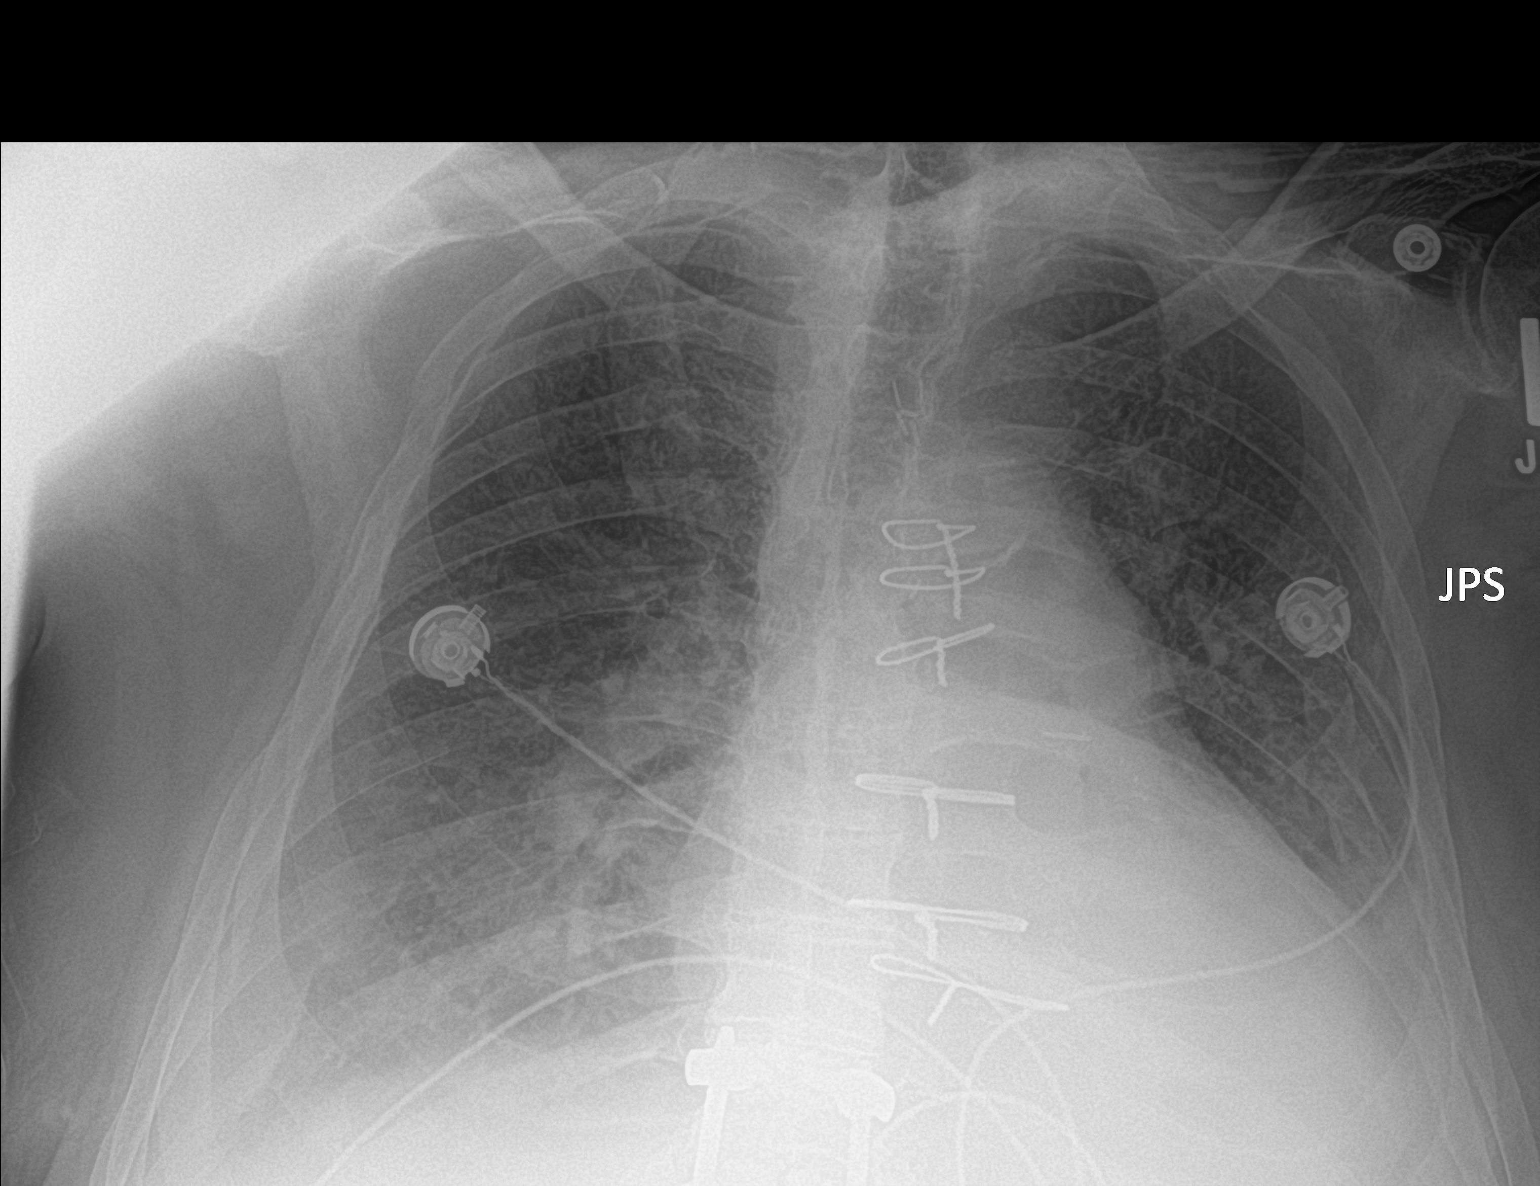

[1 of 1 positions shown; findings below may reference images not displayed]

FINDINGS: Prior median sternotomy. Stable cardiomegaly. Diffuse bilateral
interstitial prominence again noted with interim slight improvement
from prior exam. Small bilateral pleural effusions again noted. No
pneumothorax.
IMPRESSION: 1. Prior median sternotomy. Stable cardiomegaly.
2. Diffuse bilateral interstitial prominence again noted with
interim slight improvement from prior exam. Findings suggest
improving interstitial edema. Small bilateral pleural effusions
again noted.

## 2022-01-31 IMAGING — CT CT ANGIO CHEST
2 of 6 series · 17 of 46 positions shown · IV contrast (Omnipaque or Isovue)
Comparison: Chest CT, 03/21/2020.  Current chest radiograph.

CLINICAL DATA: Bilateral leg swelling for approximately 1 week.
Decreased oxygen saturation on room air. History of hypertension,
diabetes, COPD and CHF.

EXAM:
CT ANGIOGRAPHY CHEST WITH CONTRAST
TECHNIQUE: Multidetector CT imaging of the chest was performed using the
standard protocol during bolus administration of intravenous
contrast. Multiplanar CT image reconstructions and MIPs were
obtained to evaluate the vascular anatomy.
CONTRAST:  100mL OMNIPAQUE IOHEXOL 350 MG/ML SOLN

[Series 5: pe axial thins · axial · 0.84mm/px · z∈[+1144,+1435]mm · 14 of 319 slices shown]
[im 14/319  lung]
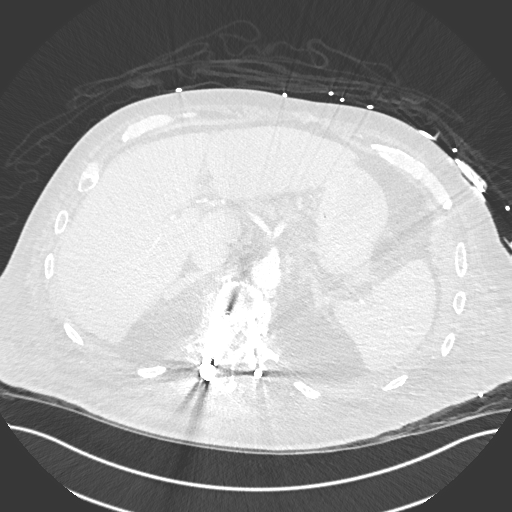
[im 42/319  soft-tissue]
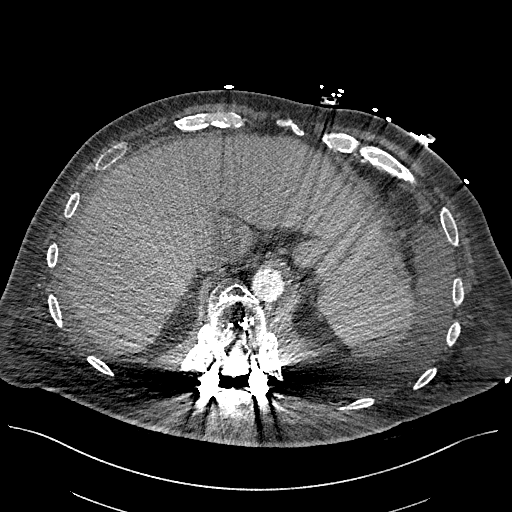
[im 56/319  lung]
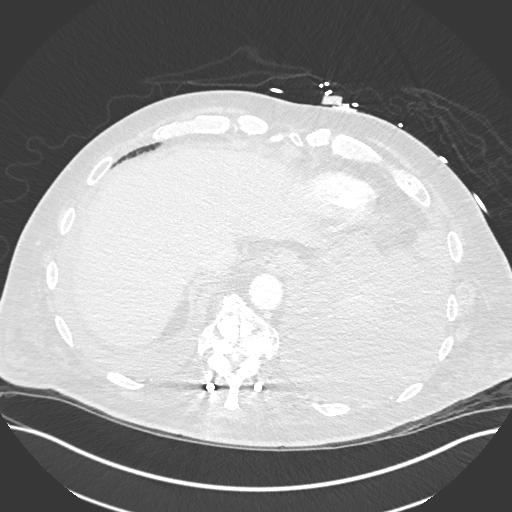
[im 83/319  soft-tissue]
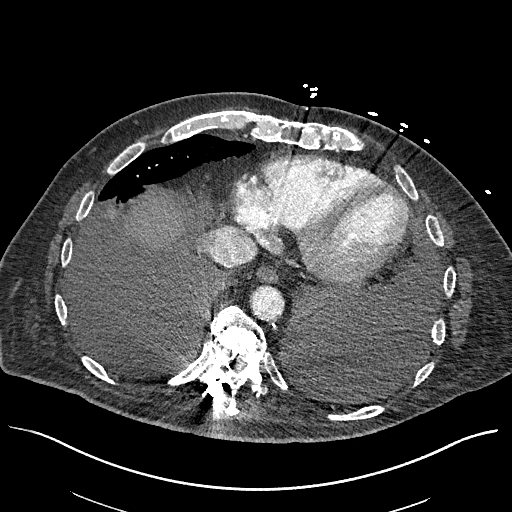
[im 111/319  lung]
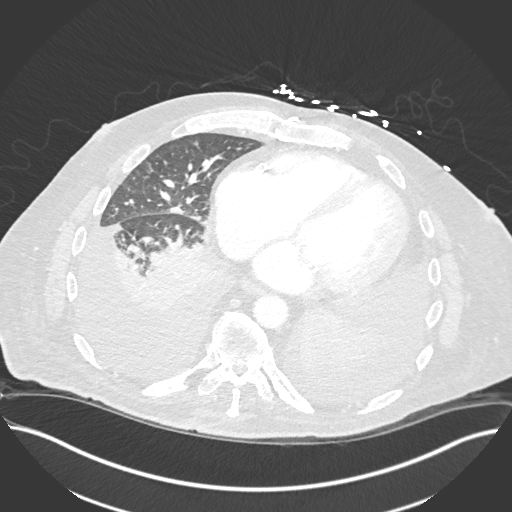
[im 125/319  soft-tissue]
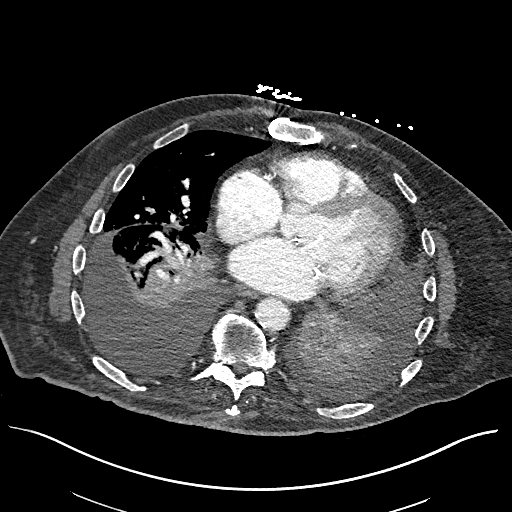
[im 153/319  lung]
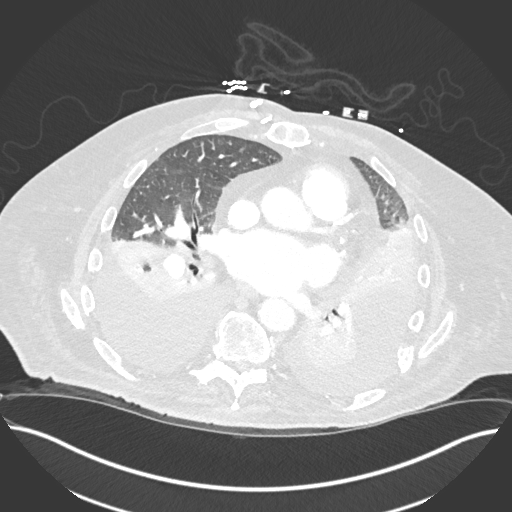
[im 166/319  soft-tissue]
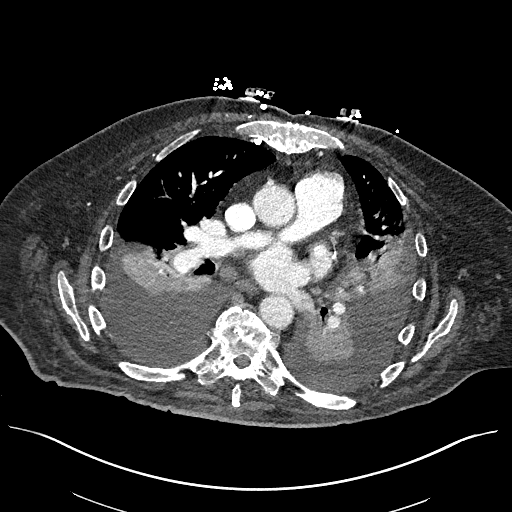
[im 194/319  lung]
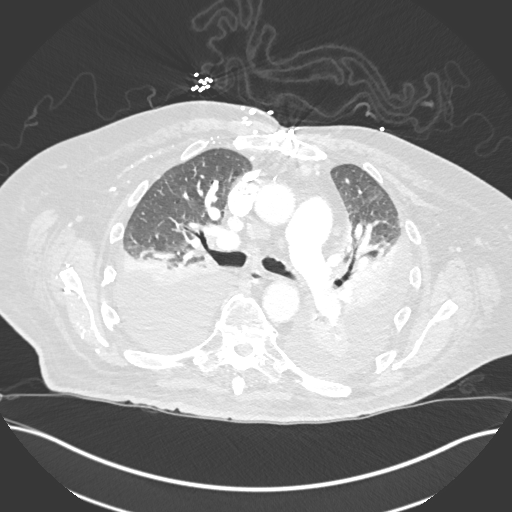
[im 208/319  soft-tissue]
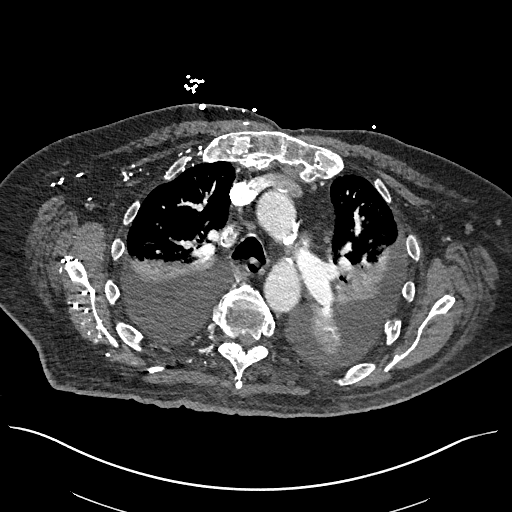
[im 236/319  lung]
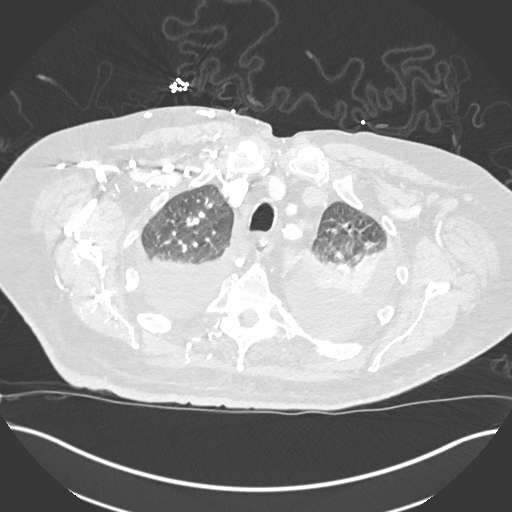
[im 263/319  soft-tissue]
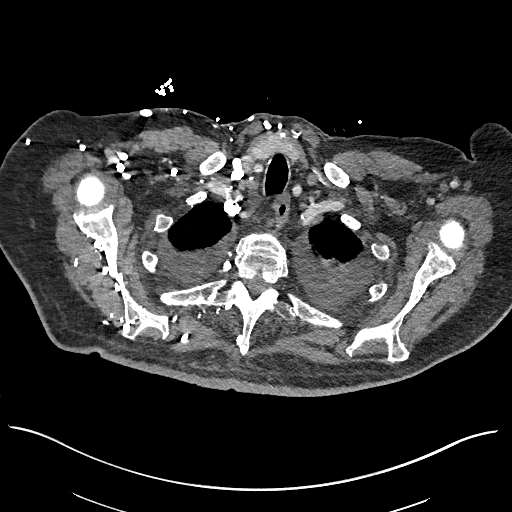
[im 277/319  lung]
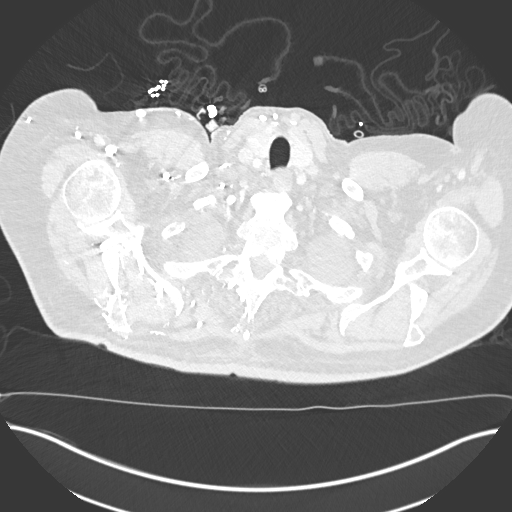
[im 305/319  soft-tissue]
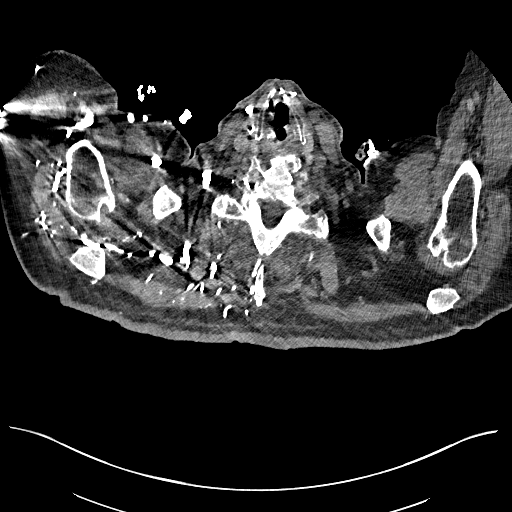

[Series 7: cor soft · coronal · 0.62mm/px · 3 of 161 slices shown]
[im 41/161  soft-tissue]
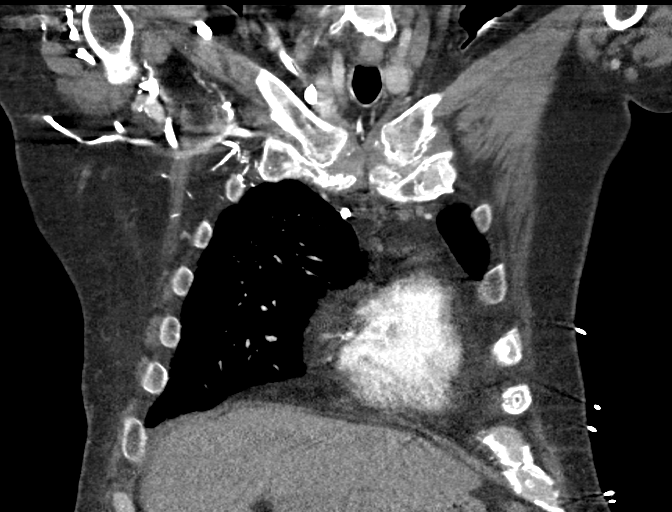
[im 81/161  soft-tissue]
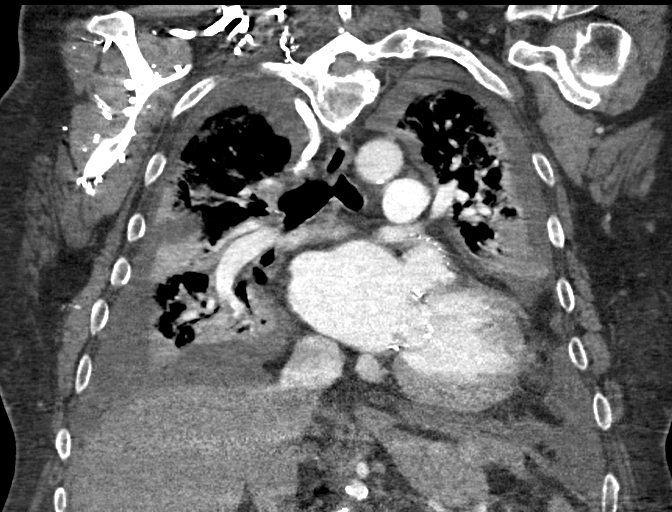
[im 121/161  soft-tissue]
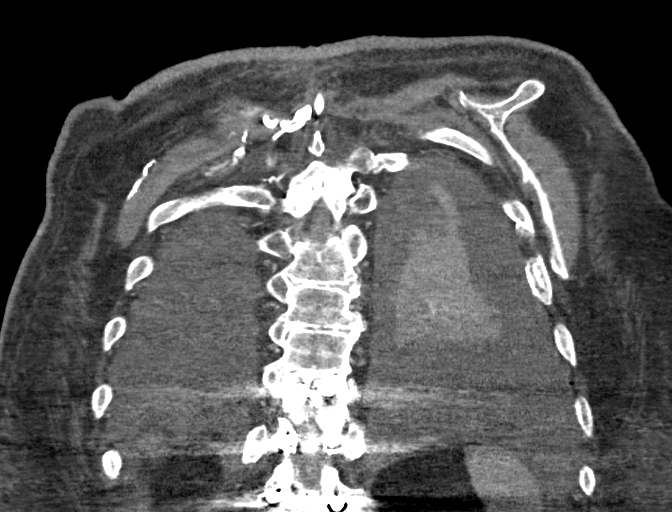

[17 of 46 positions shown; findings below may reference images not displayed]

FINDINGS: Cardiovascular: Pulmonary arteries are well opacified. No evidence
of a pulmonary embolism.

Stable changes from previous cardiac surgery aortic valve
replacement. Three-vessel coronary artery calcifications. No
pericardial effusion. Heart normal in size. Mild dilation of the
main pulmonary artery, 3.6 cm. Aorta normal in caliber. Mild aortic
atherosclerosis. No dissection.

Mediastinum/Nodes: No neck base or axillary masses or enlarged lymph
nodes. There scattered prominent mediastinal lymph nodes, precarinal
node measuring 11 mm in short axis, subcarinal node measuring 11 mm
in short axis. Nodes are stable from the prior CT. Trachea and
esophagus are unremarkable.

Lungs/Pleura: Moderate, right greater than left, pleural effusions.
Left lower lobe is completely collapsed with atelectasis. There is
significant atelectasis the right lower lobe. Additional atelectasis
is noted in the dependent upper lobes, left greater than right.
Aerated portions of the lungs show interstitial thickening. No mass
or suspicious nodule. No pneumothorax.

Upper Abdomen: No acute abnormality.

Musculoskeletal: No acute fractures. No osteoblastic or osteolytic
lesions. Old rib fractures. Vertebroplasty cement T11 and T12
vertebra. Previous posterior fusion from T11 into the lumbar spine
below the included field of view.

Review of the MIP images confirms the above findings.
IMPRESSION: 1. No evidence of a pulmonary embolism.
2. Moderate bilateral pleural effusions, greater on the right.
Significant associated dependent atelectasis similar to the prior
CT. Aerated lungs show interstitial thickening consistent with
interstitial pulmonary edema. No convincing pneumonia.
3. Stable changes from prior cardiac surgery. Three-vessel coronary
artery calcifications.
4. Mildly enlarged main pulmonary artery suggesting a component
pulmonary hypertension. This is stable.
5. Aortic atherosclerosis.

Aortic Atherosclerosis ([id number]).

## 2022-02-02 IMAGING — DX DG CHEST 1V
1 series · 1 of 1 positions shown · non-contrast
Comparison: August 19, 2020.

CLINICAL DATA: Tachycardia.

EXAM:
CHEST  1 VIEW

[chest ap]
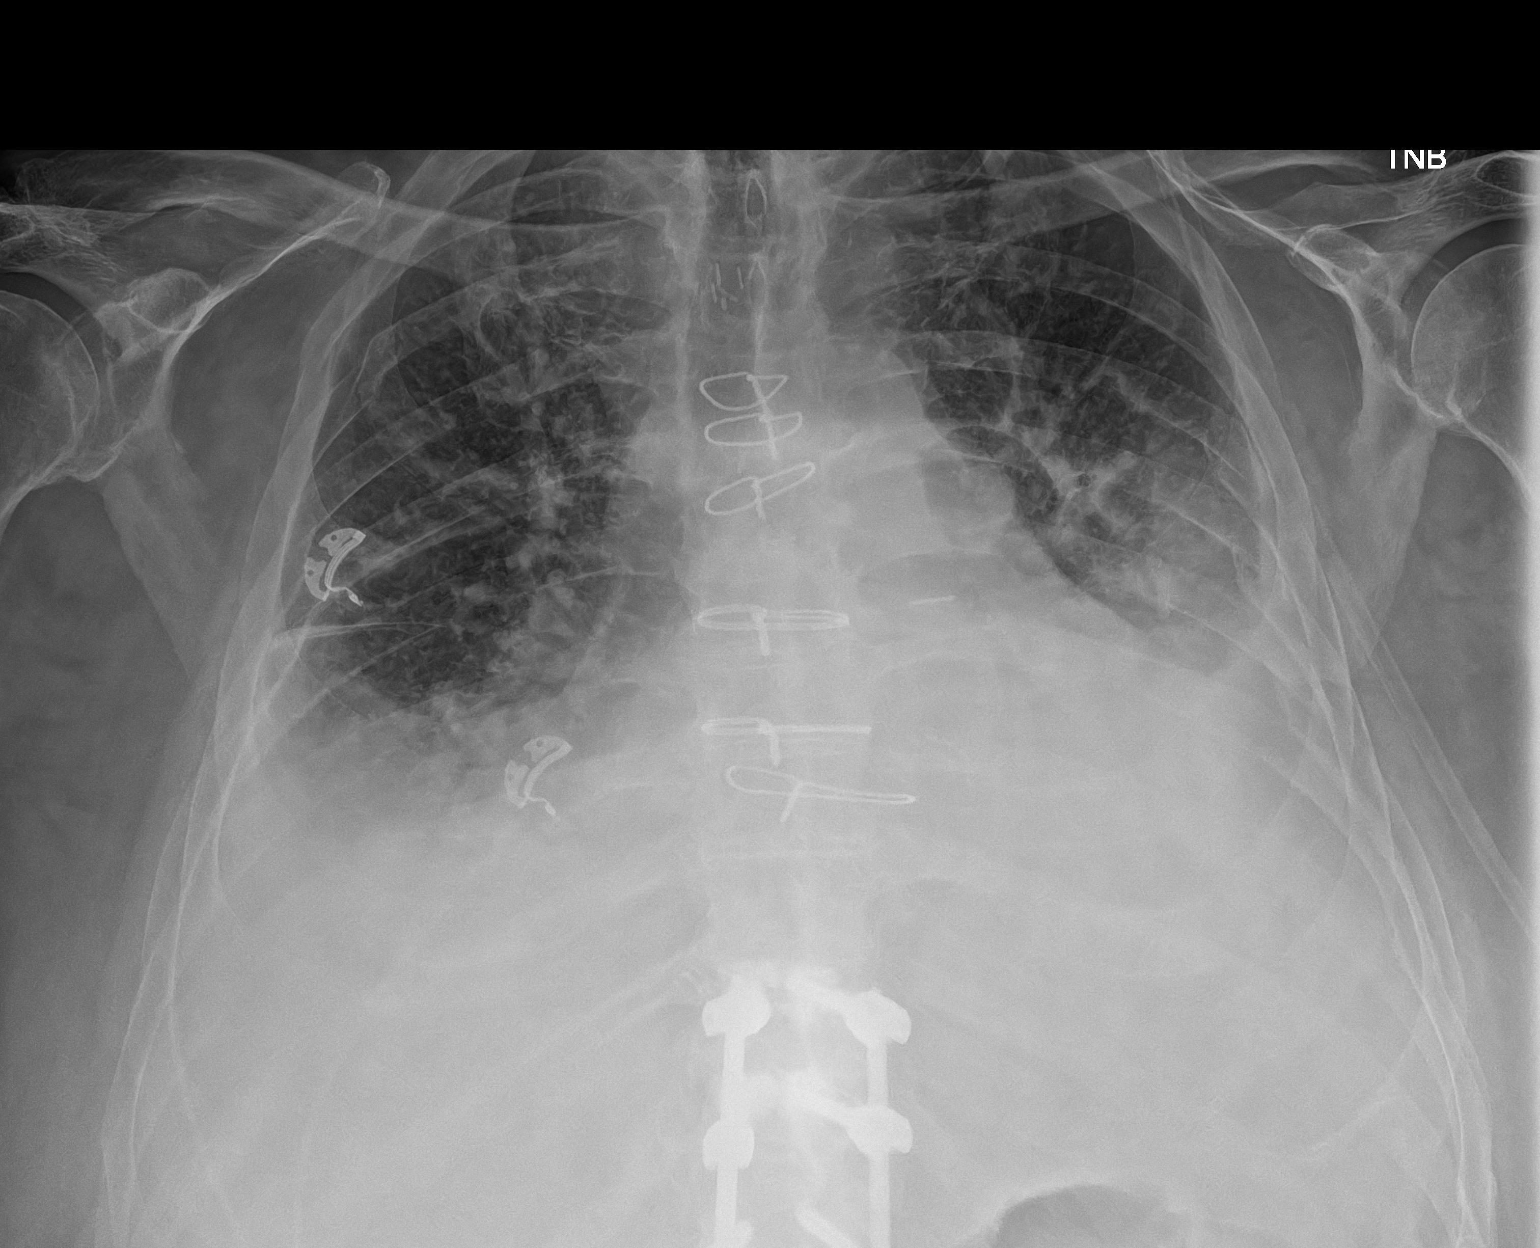

[1 of 1 positions shown; findings below may reference images not displayed]

FINDINGS: Stable cardiomegaly. Sternotomy wires are noted. No pneumothorax is
noted. Old bilateral rib fractures are noted increased bibasilar
opacities are noted concerning for worsening edema or atelectasis
with associated pleural effusions, left greater than right.
IMPRESSION: Increased bibasilar opacities are noted concerning for worsening
edema or atelectasis with associated pleural effusions, left greater
than right.

## 2022-02-04 IMAGING — DX DG CHEST 1V
1 series · 2 of 2 positions shown · non-contrast
Comparison: 08/22/2020.

CLINICAL DATA: Respiratory failure.

EXAM:
CHEST  1 VIEW

[Series 3: chest ap grid · 0.14mm/px · 2 of 2 slices shown]
[im 1/2]
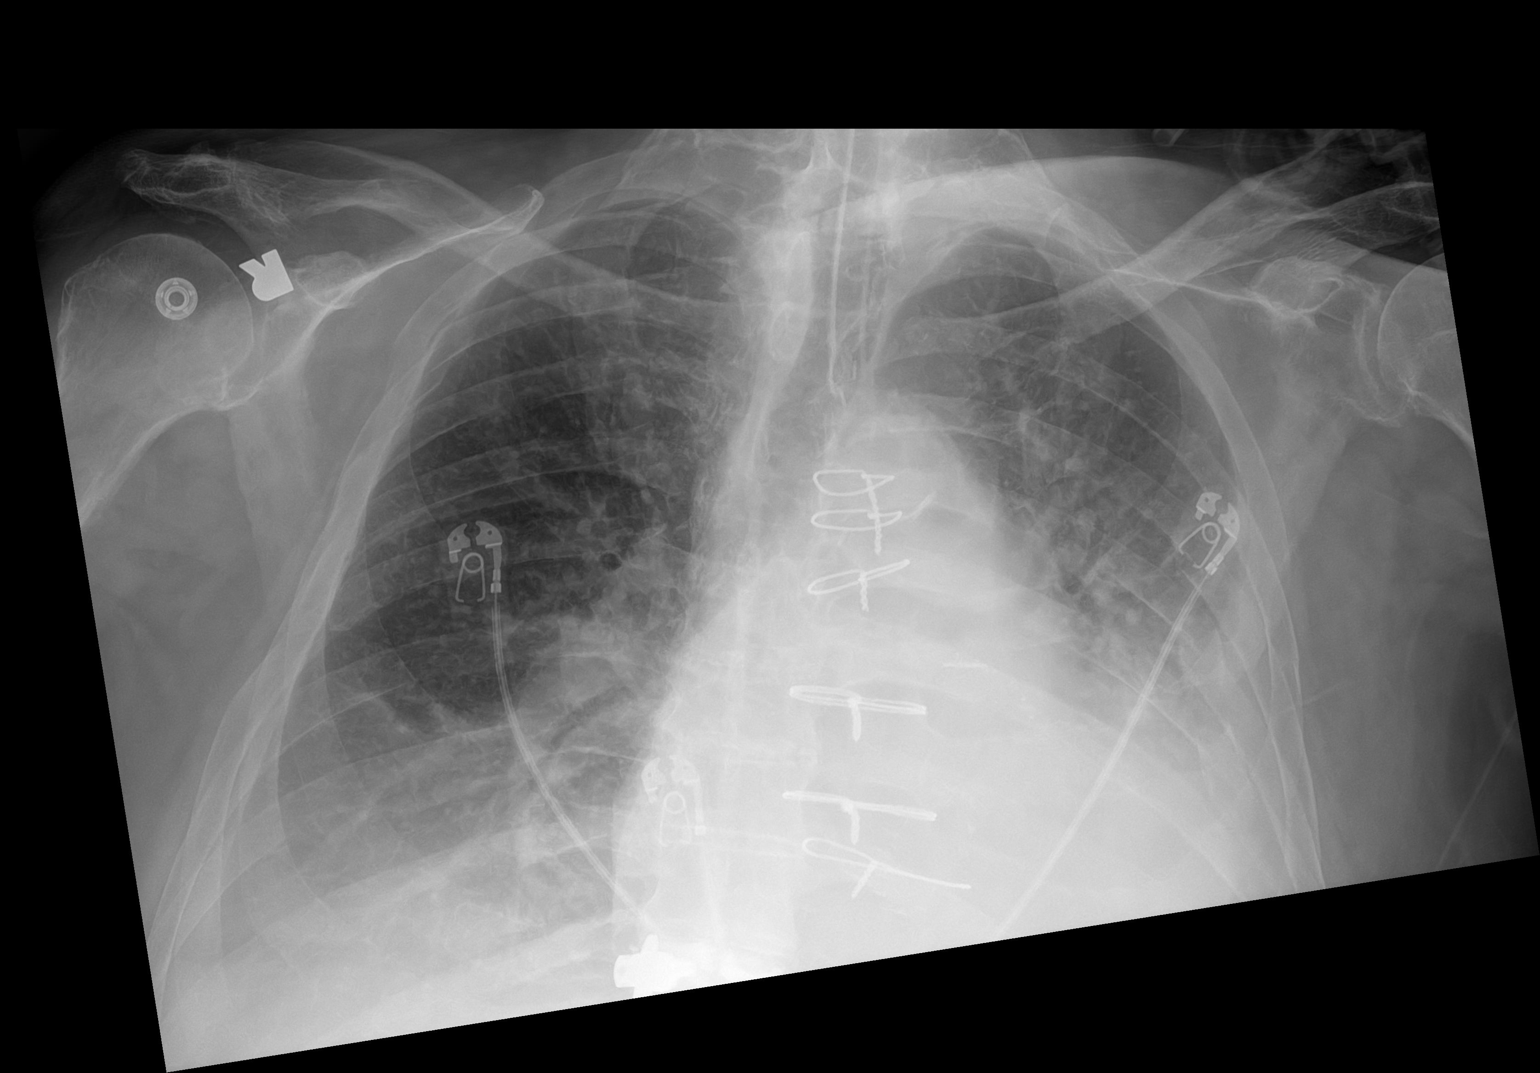
[im 2/2]
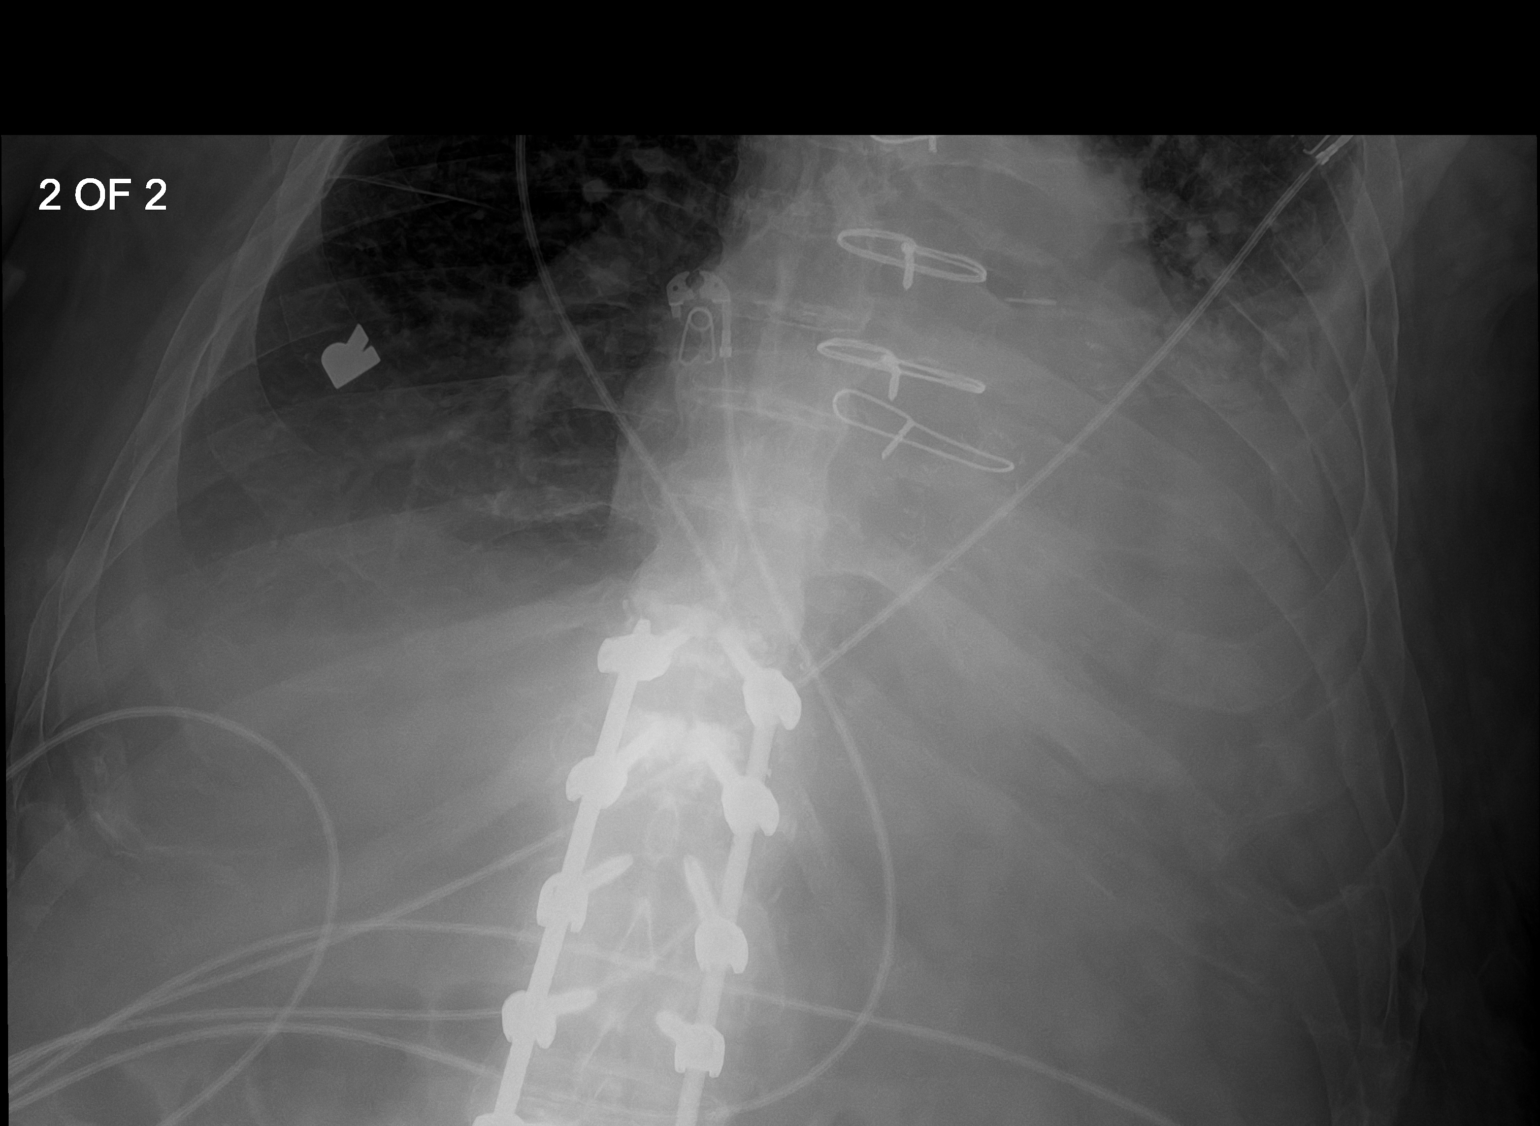

[2 of 2 positions shown; findings below may reference images not displayed]

FINDINGS: Endotracheal tube in stable position. Prior cardiac valve
replacement. Cardiomegaly with pulmonary venous congestion again
noted. Bilateral interstitial prominence consistent interstitial
edema again noted. Left base atelectasis. Bilateral pleural
effusions left side greater than right again noted. Similar findings
noted on prior exam. Old bilateral rib fractures again noted. Prior
thoracolumbar spine fusion.
IMPRESSION: 1.  Endotracheal tube in stable position.

2. Prior cardiac valve replacement. Cardiomegaly with pulmonary
venous congestion. Bilateral interstitial prominence most consistent
with interstitial edema again noted. Left base atelectasis.
Bilateral pleural effusions left side greater than right again
noted. Chest is unchanged from prior exam.

## 2022-02-04 IMAGING — RF DG NASO G TUBE PLC W/FL W/RAD
1 series · 1 of 1 positions shown · IV contrast (iopamidol)
Comparison: None

CLINICAL DATA: Nursing and position unable to pass nasogastric tube
in ICU

EXAM:
NASO G TUBE PLACEMENT WITH FL AND WITH RADIOLOGIST
CONTRAST:  20mL VABGA3-077 IOPAMIDOL (VABGA3-077) INJECTION 61%
FLUOROSCOPY TIME:  Fluoroscopy Time:  2 minutes 30 seconds
Radiation Exposure Index (if provided by the fluoroscopic device):
52.1 mGy
Number of Acquired Spot Images: 1

[Series 1: cp_standard · 0.26mm/px · 1 of 1 slices shown]
[im 1/1]
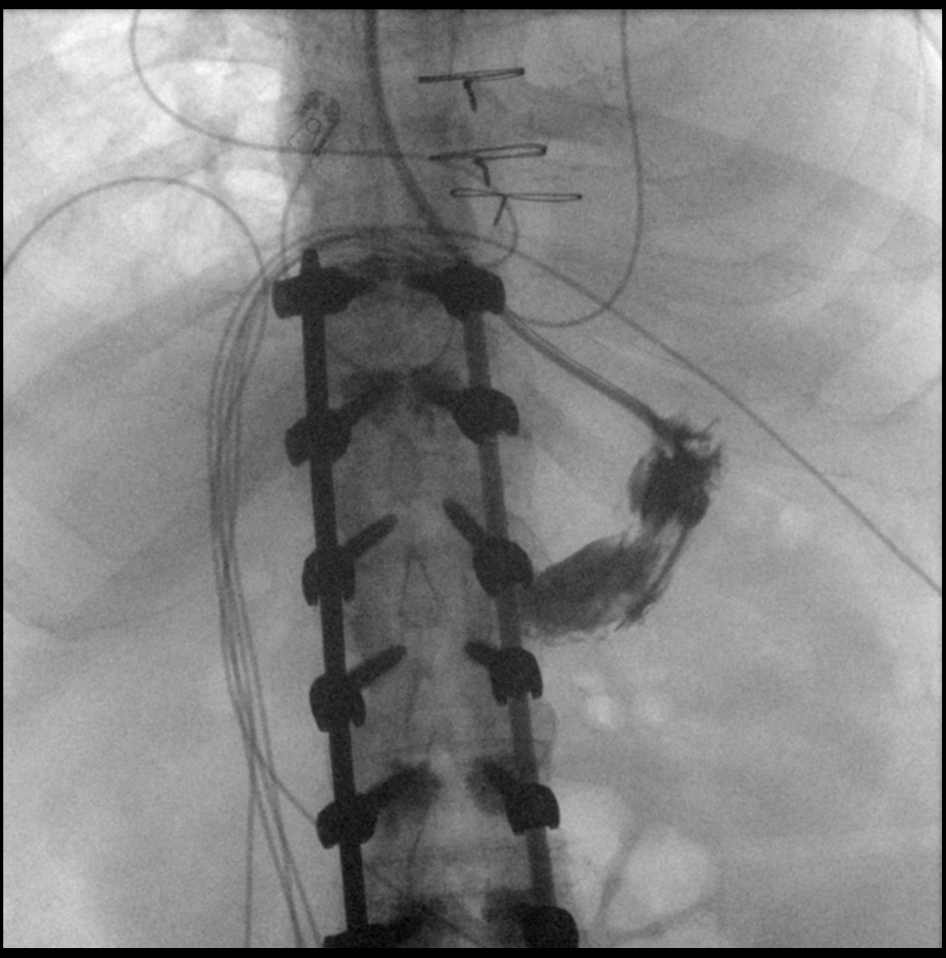

[1 of 1 positions shown; findings below may reference images not displayed]

FINDINGS: Standard 18 French nasogastric tube was placed via the RIGHT nostril
using lubricant.

Tube was placed into the proximal esophagus.

Under fluoroscopic visualization, 2 was advanced into the stomach.

Intragastric position confirmed with 20 cc of contrast via the tube.

Tube was secured by nursing.
IMPRESSION: Nasogastric tube placement as above

## 2022-02-05 IMAGING — DX DG CHEST 1V PORT
1 series · 1 of 1 positions shown · non-contrast
Comparison: 08/23/2020

CLINICAL DATA: Respiratory failure

EXAM:
PORTABLE CHEST 1 VIEW

[chest ap]
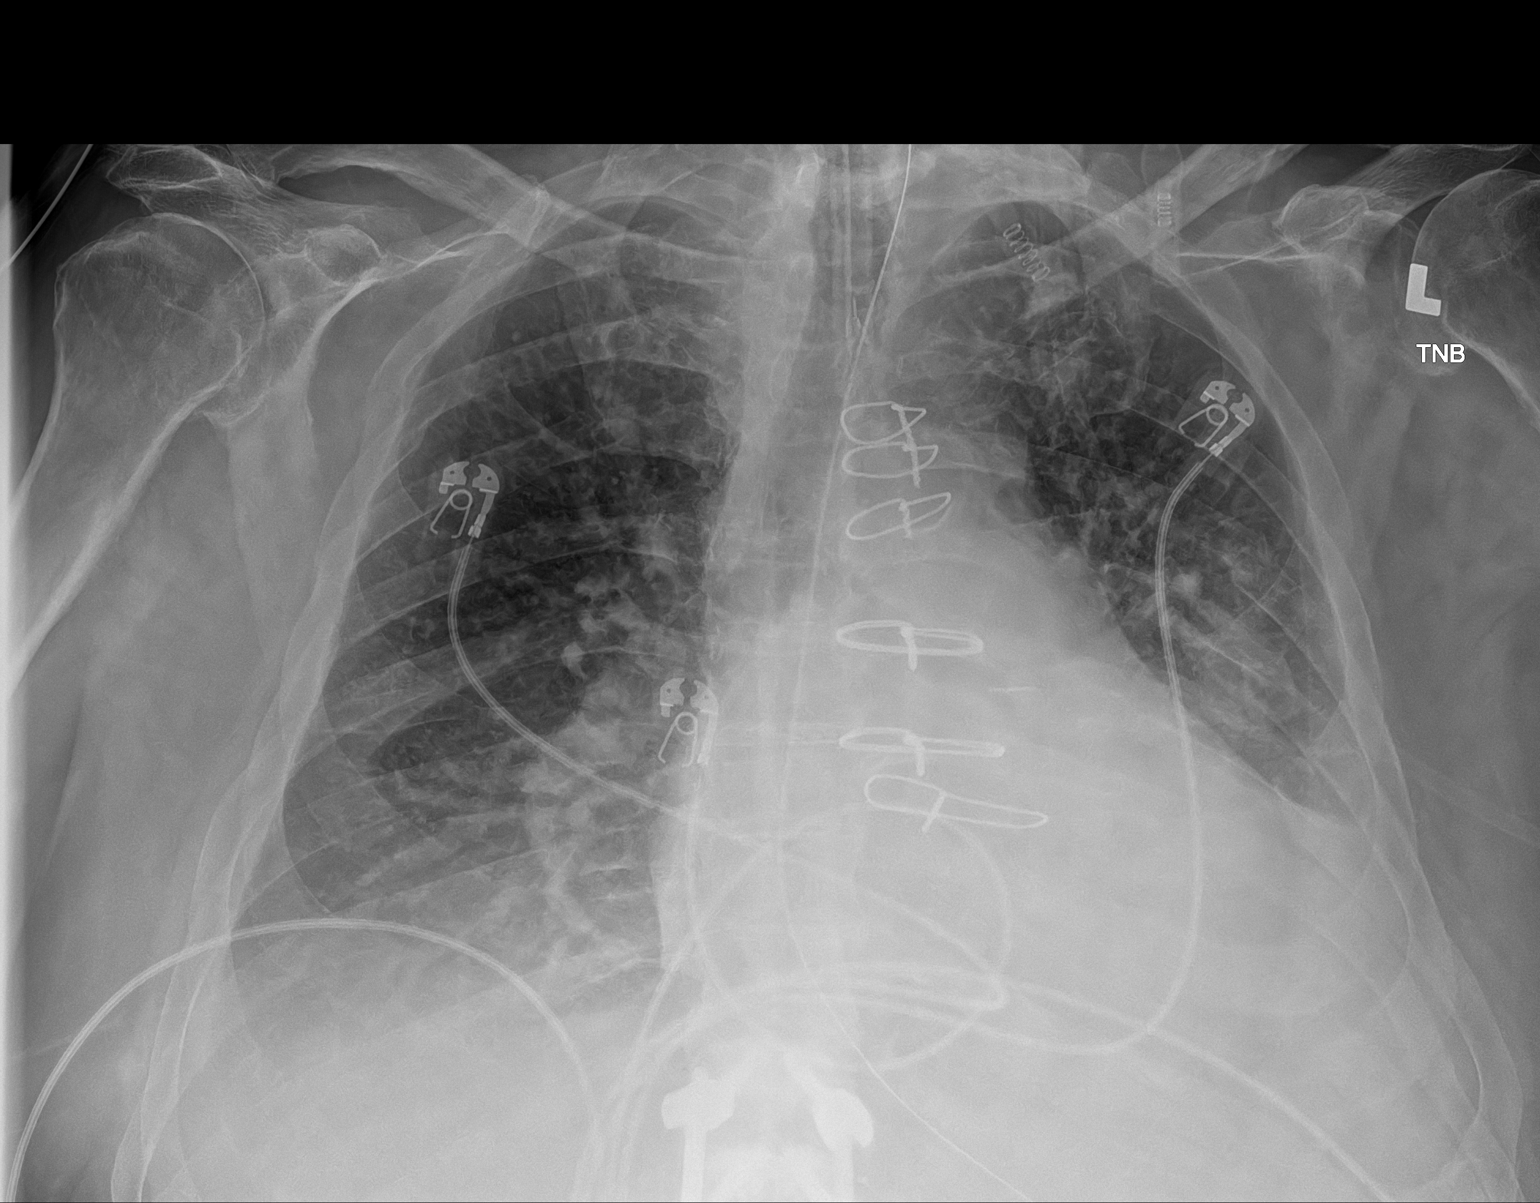

[1 of 1 positions shown; findings below may reference images not displayed]

FINDINGS: Endotracheal and enteric tubes remain in good position. Post CABG
changes. Stable cardiomegaly. Persistent bilateral pleural
effusions, left greater than right, with associated bibasilar
atelectasis. Improving aeration of the lung fields compared to
prior. No pneumothorax.
IMPRESSION: Improving aeration of the lung fields compared to prior.
